# Patient Record
Sex: Male | Born: 1945 | Race: White | Hispanic: No
Health system: Southern US, Community
[De-identification: ages and names within clinical notes are randomized; demographics above are authoritative.]

## PROBLEM LIST (undated history)

## (undated) DIAGNOSIS — I639 Cerebral infarction, unspecified: Secondary | ICD-10-CM

## (undated) DIAGNOSIS — N400 Enlarged prostate without lower urinary tract symptoms: Secondary | ICD-10-CM

## (undated) DIAGNOSIS — G629 Polyneuropathy, unspecified: Secondary | ICD-10-CM

## (undated) DIAGNOSIS — I10 Essential (primary) hypertension: Secondary | ICD-10-CM

## (undated) DIAGNOSIS — F32A Depression, unspecified: Secondary | ICD-10-CM

## (undated) DIAGNOSIS — K219 Gastro-esophageal reflux disease without esophagitis: Secondary | ICD-10-CM

## (undated) DIAGNOSIS — E7800 Pure hypercholesterolemia, unspecified: Secondary | ICD-10-CM

## (undated) DIAGNOSIS — G5600 Carpal tunnel syndrome, unspecified upper limb: Secondary | ICD-10-CM

## (undated) DIAGNOSIS — Z853 Personal history of malignant neoplasm of breast: Secondary | ICD-10-CM

## (undated) HISTORY — DX: Gastro-esophageal reflux disease without esophagitis: K21.9

## (undated) HISTORY — DX: Benign prostatic hyperplasia without lower urinary tract symptoms: N40.0

## (undated) HISTORY — DX: Polyneuropathy, unspecified: G62.9

## (undated) HISTORY — DX: Depression, unspecified: F32.A

## (undated) HISTORY — DX: Carpal tunnel syndrome, unspecified upper limb: G56.00

## (undated) HISTORY — DX: Essential (primary) hypertension: I10

## (undated) HISTORY — PX: TONSILLECTOMY AND ADENOIDECTOMY: SUR1326

## (undated) HISTORY — PX: LITHOTRIPSY: SUR834

## (undated) HISTORY — DX: Personal history of malignant neoplasm of breast: Z85.3

---

## 2011-06-20 ENCOUNTER — Emergency Department (INDEPENDENT_AMBULATORY_CARE_PROVIDER_SITE_OTHER): Payer: Medicare Other

## 2011-06-20 ENCOUNTER — Encounter (HOSPITAL_BASED_OUTPATIENT_CLINIC_OR_DEPARTMENT_OTHER): Payer: Self-pay | Admitting: Emergency Medicine

## 2011-06-20 ENCOUNTER — Emergency Department (HOSPITAL_BASED_OUTPATIENT_CLINIC_OR_DEPARTMENT_OTHER)
Admission: EM | Admit: 2011-06-20 | Discharge: 2011-06-20 | Disposition: A | Discharge status: Home Health | Payer: Medicare Other | Attending: Emergency Medicine | Admitting: Emergency Medicine

## 2011-06-20 DIAGNOSIS — M62830 Muscle spasm of back: Secondary | ICD-10-CM

## 2011-06-20 DIAGNOSIS — W19XXXA Unspecified fall, initial encounter: Secondary | ICD-10-CM

## 2011-06-20 DIAGNOSIS — M25559 Pain in unspecified hip: Secondary | ICD-10-CM | POA: Insufficient documentation

## 2011-06-20 DIAGNOSIS — M545 Low back pain, unspecified: Secondary | ICD-10-CM | POA: Insufficient documentation

## 2011-06-20 DIAGNOSIS — M5380 Other specified dorsopathies, site unspecified: Secondary | ICD-10-CM | POA: Insufficient documentation

## 2011-06-20 DIAGNOSIS — S4980XA Other specified injuries of shoulder and upper arm, unspecified arm, initial encounter: Secondary | ICD-10-CM | POA: Insufficient documentation

## 2011-06-20 DIAGNOSIS — E119 Type 2 diabetes mellitus without complications: Secondary | ICD-10-CM | POA: Insufficient documentation

## 2011-06-20 DIAGNOSIS — Z79899 Other long term (current) drug therapy: Secondary | ICD-10-CM | POA: Insufficient documentation

## 2011-06-20 DIAGNOSIS — Z7982 Long term (current) use of aspirin: Secondary | ICD-10-CM | POA: Insufficient documentation

## 2011-06-20 DIAGNOSIS — W108XXA Fall (on) (from) other stairs and steps, initial encounter: Secondary | ICD-10-CM | POA: Insufficient documentation

## 2011-06-20 DIAGNOSIS — E7800 Pure hypercholesterolemia, unspecified: Secondary | ICD-10-CM | POA: Insufficient documentation

## 2011-06-20 DIAGNOSIS — S46909A Unspecified injury of unspecified muscle, fascia and tendon at shoulder and upper arm level, unspecified arm, initial encounter: Secondary | ICD-10-CM | POA: Insufficient documentation

## 2011-06-20 DIAGNOSIS — M25519 Pain in unspecified shoulder: Secondary | ICD-10-CM | POA: Insufficient documentation

## 2011-06-20 HISTORY — DX: Pure hypercholesterolemia, unspecified: E78.00

## 2011-06-20 MED ORDER — OXYCODONE-ACETAMINOPHEN 5-325 MG PO TABS: 1.0000 | ORAL_TABLET | Freq: Four times a day (QID) | ORAL | Status: DC | PRN

## 2011-06-20 MED ORDER — DIAZEPAM 5 MG PO TABS
5.0000 mg | ORAL_TABLET | Freq: Once | ORAL | Status: AC
Start: 2011-06-20 — End: 2011-06-20
  Administered 2011-06-20: 5 mg via ORAL
  Filled 2011-06-20: qty 1

## 2011-06-20 MED ORDER — DIAZEPAM 5 MG PO TABS: 5.0000 mg | ORAL_TABLET | Freq: Four times a day (QID) | ORAL | Status: AC | PRN

## 2011-06-20 MED ORDER — KETOROLAC TROMETHAMINE 10 MG PO TABS: 10.0000 mg | ORAL_TABLET | Freq: Four times a day (QID) | ORAL | Status: DC | PRN

## 2011-06-20 MED ORDER — HYDROMORPHONE HCL PF 1 MG/ML IJ SOLN
1.0000 mg | Freq: Once | INTRAMUSCULAR | Status: AC
  Administered 2011-06-20: 1 mg via INTRAMUSCULAR
  Filled 2011-06-20: qty 1

## 2011-06-20 MED ORDER — KETOROLAC TROMETHAMINE 60 MG/2ML IM SOLN
60.0000 mg | Freq: Once | INTRAMUSCULAR | Status: AC
  Administered 2011-06-20: 60 mg via INTRAMUSCULAR
  Filled 2011-06-20: qty 2

## 2011-06-20 MED ORDER — KETOROLAC TROMETHAMINE 10 MG PO TABS: 10.0000 mg | ORAL_TABLET | Freq: Four times a day (QID) | ORAL | Status: AC | PRN

## 2011-06-20 NOTE — ED Notes (Signed)
Pt fell on steps while walking down one step.  Pt slipped and fell onto bottom and left flank area.  Pain in both areas.  No head injury.  No LOC.  Denies dizziness prior to fall.

## 2011-06-20 NOTE — ED Notes (Signed)
Patients prescriptions were printed at the families request by MD and instructions given on medication administration

## 2011-06-20 NOTE — ED Provider Notes (Signed)
History  Scribed for Gwyneth Sprout, MD, the patient was seen in room MH02/MH02. This chart was scribed by Candelaria Stagers. The patient's care started at 5:32 PM    CSN: [id number]  Arrival date & time 06/20/11  1628   First MD Initiated Contact with Patient 06/20/11 1724      Chief Complaint  Patient presents with  . Fall  . Shoulder Injury  . Back Pain     The history is provided by the patient.   John [name] is a 66 y.o. male who presents to the Emergency Department complaining of back, hip, and shoulder pain after experiencing a mechanical fall about two hours ago.  Pt states that he fell down two stairs falling on his left hip, lower back, and shoulder.  He states that his right side feels normal.  The pain is worsened with movement.  He denies hitting his head and denies neck pain.  He has no history of back pain.  He is also experiencing muscle spasms of his left lower back.      Past Medical History  Diagnosis Date  . Diabetes mellitus   . Hypercholesteremia     History reviewed. No pertinent past surgical history.  History reviewed. No pertinent family history.  History  Substance Use Topics  . Smoking status: Never Smoker   . Smokeless tobacco: Not on file  . Alcohol Use: No      Review of Systems  Musculoskeletal: Positive for back pain.       Left hip, back, and shoulder pain  All other systems reviewed and are negative.    Allergies  Ramipril  Home Medications   Current Outpatient Rx  Name Route Sig Dispense Refill  . AMLODIPINE BESYLATE 5 MG PO TABS Oral Take 5 mg by mouth daily.    . ASPIRIN 325 MG PO TBEC Oral Take 325 mg by mouth daily.    . ATORVASTATIN CALCIUM 10 MG PO TABS Oral Take 10 mg by mouth at bedtime.    Marland Kitchen ESOMEPRAZOLE MAGNESIUM 40 MG PO CPDR Oral Take 40 mg by mouth daily.    . FENOFIBRATE 145 MG PO TABS Oral Take 145 mg by mouth daily.    . OMEGA-3 FATTY ACIDS 1000 MG PO CAPS Oral Take 1 g by mouth daily.    .  GLYBURIDE-METFORMIN 5-500 MG PO TABS Oral Take 3 tablets by mouth daily.    Marland Kitchen PIOGLITAZONE HCL 45 MG PO TABS Oral Take 45 mg by mouth daily.    Marland Kitchen SITAGLIPTIN PHOSPHATE 100 MG PO TABS Oral Take 100 mg by mouth daily.    Marland Kitchen VITAMIN C 500 MG PO TABS Oral Take 1,500 mg by mouth daily.      BP 150/44  Pulse 68  Temp(Src) 98.6 F (37 C) (Oral)  Resp 24  SpO2 95%  Physical Exam  Nursing note and vitals reviewed. Constitutional: He is oriented to person, place, and time. He appears well-developed and well-nourished.       Appears uncomfortable  HENT:  Head: Normocephalic and atraumatic.  Eyes: EOM are normal. Right eye exhibits no discharge. Left eye exhibits no discharge.  Neck: Normal range of motion.  Cardiovascular: Intact distal pulses.   Pulmonary/Chest: No respiratory distress.  Musculoskeletal:       No lumbar thoracic shoulder elbow knee or ankle tenderness, normal ROM.  Severe spasms with movement in the left paralumbar region.   Neurological: He is alert and oriented to person, place, and time.  Skin:  Skin is warm and dry.  Psychiatric: He has a normal mood and affect. His behavior is normal.    ED Course  Procedures  DIAGNOSTIC STUDIES: Oxygen Saturation is 95% on room air, normal by my interpretation.    COORDINATION OF CARE:  17:35 DG Lumbar Spine Complete ; ketorolac (TORADOL) injection 60 mg ; HYDROmorphone (DILAUDID) injection 1 mg ; diazepam (VALIUM) tablet 5 mg  No results found for this or any previous visit. Dg Lumbar Spine Complete  06/20/2011  *RADIOLOGY REPORT*  Clinical Data: History of fall complaining of lower back pain.  LUMBAR SPINE - COMPLETE 4+ VIEW  Comparison: No priors.  Findings: No acute displaced fractures or compression type fractures are noted.  There is loss of intervertebral disc height at many levels, most severe at L5-S1.  Multilevel facet arthropathy is also noted, most severe from L3-S1.  IMPRESSION: 1.  No acute radiographic abnormality  of the lumbar spine. 2.  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  Original Report Authenticated By: Florencia Reasons, M.D.      Labs Reviewed - No data to display No results found.   1. Back spasm       MDM  Patient with mechanical fall down the stairs today for severe spasm in his left back. There is no point tenderness in his L-spine and he denies any numbness or weakness in his legs. Neurovascularly intact and normal pulses distally. Patient denies head injury or LOC. Lumbar films are negative. After Toradol and Dilaudid patient's pain is much improved. 6:32 PM Patient feeling much better after medications. We'll discharge home  I personally performed the services described in this documentation, which was scribed in my presence.  The recorded information has been reviewed and considered.        Gwyneth Sprout, MD 06/21/11 [phone]

## 2011-06-20 NOTE — ED Notes (Signed)
Patient states he slipped off 2nd step and fell on shoulder and hit lower back, c/o back spasms, hurts to move or twist, states able to move shoulders

## 2012-01-19 ENCOUNTER — Other Ambulatory Visit: Payer: Self-pay | Admitting: Orthopedic Surgery

## 2012-01-19 DIAGNOSIS — M25512 Pain in left shoulder: Secondary | ICD-10-CM

## 2012-01-21 ENCOUNTER — Ambulatory Visit
Admission: RE | Admit: 2012-01-21 | Discharge: 2012-01-21 | Disposition: A | Discharge status: Home / Self Care | Payer: Medicare Other | Source: Ambulatory Visit | Attending: Orthopedic Surgery | Admitting: Orthopedic Surgery

## 2012-01-21 DIAGNOSIS — M25512 Pain in left shoulder: Secondary | ICD-10-CM

## 2012-01-22 ENCOUNTER — Other Ambulatory Visit: Payer: Medicare Other

## 2014-09-16 DIAGNOSIS — R002 Palpitations: Secondary | ICD-10-CM | POA: Insufficient documentation

## 2014-09-16 DIAGNOSIS — E139 Other specified diabetes mellitus without complications: Secondary | ICD-10-CM | POA: Insufficient documentation

## 2015-07-02 DIAGNOSIS — E119 Type 2 diabetes mellitus without complications: Secondary | ICD-10-CM | POA: Insufficient documentation

## 2015-10-15 ENCOUNTER — Other Ambulatory Visit: Payer: Self-pay | Admitting: Orthopedic Surgery

## 2015-10-15 DIAGNOSIS — M542 Cervicalgia: Secondary | ICD-10-CM

## 2015-10-23 ENCOUNTER — Ambulatory Visit
Admission: RE | Admit: 2015-10-23 | Discharge: 2015-10-23 | Disposition: A | Discharge status: Home / Self Care | Payer: Medicare Other | Source: Ambulatory Visit | Attending: Orthopedic Surgery | Admitting: Orthopedic Surgery

## 2015-10-23 DIAGNOSIS — M542 Cervicalgia: Secondary | ICD-10-CM

## 2016-03-05 DIAGNOSIS — Z79810 Long term (current) use of selective estrogen receptor modulators (SERMs): Secondary | ICD-10-CM | POA: Insufficient documentation

## 2016-09-17 DIAGNOSIS — E6609 Other obesity due to excess calories: Secondary | ICD-10-CM | POA: Insufficient documentation

## 2016-09-17 DIAGNOSIS — E66811 Obesity, class 1: Secondary | ICD-10-CM | POA: Insufficient documentation

## 2016-11-09 DIAGNOSIS — H5203 Hypermetropia, bilateral: Secondary | ICD-10-CM | POA: Insufficient documentation

## 2017-01-07 DIAGNOSIS — H43812 Vitreous degeneration, left eye: Secondary | ICD-10-CM | POA: Insufficient documentation

## 2017-01-07 DIAGNOSIS — H35373 Puckering of macula, bilateral: Secondary | ICD-10-CM | POA: Insufficient documentation

## 2017-01-17 DIAGNOSIS — Z961 Presence of intraocular lens: Secondary | ICD-10-CM | POA: Insufficient documentation

## 2017-01-18 HISTORY — PX: CATARACT EXTRACTION, BILATERAL: SHX1313

## 2017-11-30 DIAGNOSIS — H17813 Minor opacity of cornea, bilateral: Secondary | ICD-10-CM | POA: Insufficient documentation

## 2017-12-09 DIAGNOSIS — R590 Localized enlarged lymph nodes: Secondary | ICD-10-CM | POA: Insufficient documentation

## 2017-12-13 DIAGNOSIS — C50911 Malignant neoplasm of unspecified site of right female breast: Secondary | ICD-10-CM | POA: Insufficient documentation

## 2018-01-18 DIAGNOSIS — E860 Dehydration: Secondary | ICD-10-CM | POA: Insufficient documentation

## 2018-02-22 DIAGNOSIS — K580 Irritable bowel syndrome with diarrhea: Secondary | ICD-10-CM | POA: Insufficient documentation

## 2018-05-02 HISTORY — PX: MASTECTOMY: SHX3

## 2018-05-17 DIAGNOSIS — G608 Other hereditary and idiopathic neuropathies: Secondary | ICD-10-CM | POA: Insufficient documentation

## 2019-01-03 DIAGNOSIS — Z978 Presence of other specified devices: Secondary | ICD-10-CM | POA: Insufficient documentation

## 2019-01-03 DIAGNOSIS — Z95828 Presence of other vascular implants and grafts: Secondary | ICD-10-CM | POA: Insufficient documentation

## 2019-05-21 ENCOUNTER — Ambulatory Visit: Payer: Medicare Other

## 2019-06-08 ENCOUNTER — Ambulatory Visit: Payer: Medicare Other

## 2019-08-31 ENCOUNTER — Ambulatory Visit: Payer: Medicare Other | Admitting: Family Medicine

## 2019-09-29 DIAGNOSIS — M792 Neuralgia and neuritis, unspecified: Secondary | ICD-10-CM | POA: Insufficient documentation

## 2019-09-29 DIAGNOSIS — E1142 Type 2 diabetes mellitus with diabetic polyneuropathy: Secondary | ICD-10-CM | POA: Insufficient documentation

## 2019-10-17 ENCOUNTER — Encounter: Payer: Self-pay | Admitting: Family Medicine

## 2019-10-17 ENCOUNTER — Ambulatory Visit: Payer: Medicare Other | Admitting: Family Medicine

## 2019-10-17 ENCOUNTER — Other Ambulatory Visit: Payer: Self-pay

## 2019-10-17 VITALS — BP 128/62 | HR 79 | Temp 98.8°F | Ht 72.0 in | Wt 242.0 lb

## 2019-10-17 DIAGNOSIS — G63 Polyneuropathy in diseases classified elsewhere: Secondary | ICD-10-CM

## 2019-10-17 DIAGNOSIS — I10 Essential (primary) hypertension: Secondary | ICD-10-CM | POA: Insufficient documentation

## 2019-10-17 DIAGNOSIS — C801 Malignant (primary) neoplasm, unspecified: Secondary | ICD-10-CM

## 2019-10-17 DIAGNOSIS — G629 Polyneuropathy, unspecified: Secondary | ICD-10-CM | POA: Insufficient documentation

## 2019-10-17 DIAGNOSIS — K219 Gastro-esophageal reflux disease without esophagitis: Secondary | ICD-10-CM | POA: Insufficient documentation

## 2019-10-17 DIAGNOSIS — N400 Enlarged prostate without lower urinary tract symptoms: Secondary | ICD-10-CM | POA: Insufficient documentation

## 2019-10-17 DIAGNOSIS — F325 Major depressive disorder, single episode, in full remission: Secondary | ICD-10-CM | POA: Diagnosis not present

## 2019-10-17 DIAGNOSIS — Z853 Personal history of malignant neoplasm of breast: Secondary | ICD-10-CM | POA: Insufficient documentation

## 2019-10-17 DIAGNOSIS — G5600 Carpal tunnel syndrome, unspecified upper limb: Secondary | ICD-10-CM | POA: Insufficient documentation

## 2019-10-17 NOTE — Progress Notes (Signed)
Chief Complaint  Patient presents with  . New Patient (Initial Visit)       New Patient Visit SUBJECTIVE: HPI: John [name] is an 74 y.o.male who is being seen for establishing care.  The patient was previously seen at [facility], PCP retired.  Patient has a history of diabetes that is uncontrolled.  He follows with endocrinology.  Patient has a history of breast cancer on the right.  He is status post removal/resection.  He follows with the oncology team.  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  He is currently taking Prozac 20 mg daily.  He is compliant and reports no adverse effects.  He is interested in coming off of this.  His sister-in-law is in hospice and he would like to wait until after she passes before weaning off.  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  It affects his upper and lower extremities equally.  He will sometimes have balance issues due to lack of proprioception.  He has been taking gabapentin for this and believes it is affecting his balance without helping his neuropathy issue.  He would like to come off of it.  Past Medical History:  Diagnosis Date  . Diabetes mellitus   . Hypercholesteremia    History reviewed. No pertinent surgical history. History reviewed. No pertinent family history. Allergies  Allergen Reactions  . Ramipril Anaphylaxis    Current Outpatient Medications:  .  amLODipine (NORVASC) 5 MG tablet, Take 5 mg by mouth daily., Disp: , Rfl:  .  aspirin 325 MG EC tablet, Take 325 mg by mouth daily., Disp: , Rfl:  .  Dulaglutide (TRULICITY) 1.5 DD/2.2GU SOPN, Inject 1.5 mg into the skin., Disp: , Rfl:  .  FLUoxetine (PROZAC) 20 MG capsule, Take 20 mg by mouth daily., Disp: , Rfl:  .  gabapentin (NEURONTIN) 300 MG capsule, Take 300 mg by mouth 6 (six) times daily., Disp: , Rfl:  .  insulin glargine (LANTUS) 100 UNIT/ML injection, Inject 80 Units into the skin daily., Disp: ,  Rfl:  .  metFORMIN (GLUCOPHAGE-XR) 500 MG 24 hr tablet, Take 500 mg by mouth in the morning, at noon, and at bedtime., Disp: , Rfl:  .  Multiple Vitamin (MULTIVITAMIN) tablet, Take 1 tablet by mouth daily., Disp: , Rfl:  .  omeprazole (PRILOSEC) 40 MG capsule, Take 40 mg by mouth daily., Disp: , Rfl:  .  tamoxifen (NOLVADEX) 20 MG tablet, Take 20 mg by mouth daily., Disp: , Rfl:  .  vitamin C (ASCORBIC ACID) 500 MG tablet, Take 1,500 mg by mouth daily., Disp: , Rfl:   OBJECTIVE: BP 128/62 (BP Location: Left Arm, Patient Position: Sitting, Cuff Size: Large)   Pulse 79   Temp 98.8 F (37.1 C) (Oral)   Ht 6' (1.829 m)   Wt 242 lb (109.8 kg)   SpO2 95%   BMI 32.82 kg/m  General:  well developed, well nourished, in no apparent distress Skin:  no significant moles, warts, or growths Lungs:  clear to auscultation, breath sounds equal bilaterally, no respiratory distress Cardio:  regular rate and rhythm, 2+ pitting LE edema tapering at proximal third of tibia bilaterally, no bruits Neuro: No cerebellar signs Psych: well oriented with normal range of affect and appropriate judgment/insight  ASSESSMENT/PLAN: Neuropathy associated with cancer ([place])  Essential hypertension  Depression, major, single episode, complete remission ([place])  Ok to drop a capsule of gabapentin from daily regimen every 3-5 days until  s/s's worsen or he is no longer taking.  Cont meds for htn. For depression, take 10 mg/d for 2 weeks then stop when ready.  Patient should return in 4 mo. The patient voiced understanding and agreement to the plan.   Welcome, DO 10/17/19  3:12 PM

## 2019-10-17 NOTE — Patient Instructions (Addendum)
When you are ready, take 10 mg daily of the Prozac daily for 2 weeks and then stop.   Also when you are ready, take away 1 capsule every 3-5 days until you are no longer taking any more medication.  We can always add things back.    Let us know if you need anything.

## 2019-10-24 ENCOUNTER — Emergency Department (HOSPITAL_BASED_OUTPATIENT_CLINIC_OR_DEPARTMENT_OTHER): Payer: Medicare Other

## 2019-10-24 ENCOUNTER — Emergency Department (HOSPITAL_BASED_OUTPATIENT_CLINIC_OR_DEPARTMENT_OTHER)
Admission: EM | Admit: 2019-10-24 | Discharge: 2019-10-24 | Disposition: A | Discharge status: Home / Self Care | Payer: Medicare Other | Source: Home / Self Care | Attending: Emergency Medicine | Admitting: Emergency Medicine

## 2019-10-24 ENCOUNTER — Telehealth: Payer: Self-pay | Admitting: Adult Health

## 2019-10-24 ENCOUNTER — Encounter (HOSPITAL_BASED_OUTPATIENT_CLINIC_OR_DEPARTMENT_OTHER): Payer: Self-pay | Admitting: Emergency Medicine

## 2019-10-24 ENCOUNTER — Other Ambulatory Visit: Payer: Self-pay

## 2019-10-24 DIAGNOSIS — R1110 Vomiting, unspecified: Secondary | ICD-10-CM | POA: Insufficient documentation

## 2019-10-24 DIAGNOSIS — U071 COVID-19: Secondary | ICD-10-CM | POA: Insufficient documentation

## 2019-10-24 DIAGNOSIS — R509 Fever, unspecified: Secondary | ICD-10-CM

## 2019-10-24 DIAGNOSIS — R531 Weakness: Secondary | ICD-10-CM | POA: Diagnosis not present

## 2019-10-24 DIAGNOSIS — R059 Cough, unspecified: Secondary | ICD-10-CM

## 2019-10-24 LAB — COMPREHENSIVE METABOLIC PANEL
ALT: 41 U/L (ref 0–44)
AST: 48 U/L — ABNORMAL HIGH (ref 15–41)
Albumin: 3.6 g/dL (ref 3.5–5.0)
Alkaline Phosphatase: 37 U/L — ABNORMAL LOW (ref 38–126)
Anion gap: 13 (ref 5–15)
BUN: 14 mg/dL (ref 8–23)
CO2: 24 mmol/L (ref 22–32)
Calcium: 8.7 mg/dL — ABNORMAL LOW (ref 8.9–10.3)
Chloride: 96 mmol/L — ABNORMAL LOW (ref 98–111)
Creatinine, Ser: 1.04 mg/dL (ref 0.61–1.24)
GFR calc Af Amer: >60 mL/min (ref >60)
GFR calc non Af Amer: >60 mL/min (ref >60)
Glucose, Bld: 285 mg/dL — ABNORMAL HIGH (ref 70–99)
Potassium: 3.8 mmol/L (ref 3.5–5.1)
Sodium: 133 mmol/L — ABNORMAL LOW (ref 135–145)
Total Bilirubin: 1 mg/dL (ref 0.3–1.2)
Total Protein: 6.4 g/dL — ABNORMAL LOW (ref 6.5–8.1)

## 2019-10-24 LAB — URINALYSIS, ROUTINE W REFLEX MICROSCOPIC
Bilirubin Urine: NEGATIVE
Glucose, UA: >=500 mg/dL — AB
Hgb urine dipstick: NEGATIVE
Ketones, ur: 15 mg/dL — AB
Leukocytes,Ua: NEGATIVE
Nitrite: NEGATIVE
Protein, ur: NEGATIVE mg/dL
Specific Gravity, Urine: 1.025 (ref 1.005–1.030)
pH: 6 (ref 5.0–8.0)

## 2019-10-24 LAB — URINALYSIS, MICROSCOPIC (REFLEX): Squamous Epithelial / HPF: NONE SEEN (ref 0–5)

## 2019-10-24 LAB — CBC
HCT: 40.5 % (ref 39.0–52.0)
Hemoglobin: 14 g/dL (ref 13.0–17.0)
MCH: 31.3 pg (ref 26.0–34.0)
MCHC: 34.6 g/dL (ref 30.0–36.0)
MCV: 90.6 fL (ref 80.0–100.0)
Platelets: 181 10*3/uL (ref 150–400)
RBC: 4.47 MIL/uL (ref 4.22–5.81)
RDW: 14 % (ref 11.5–15.5)
WBC: 6.4 10*3/uL (ref 4.0–10.5)
nRBC: 0 % (ref 0.0–0.2)

## 2019-10-24 LAB — LIPASE, BLOOD: Lipase: 22 U/L (ref 11–51)

## 2019-10-24 LAB — SARS CORONAVIRUS 2 BY RT PCR (HOSPITAL ORDER, PERFORMED IN ~~LOC~~ HOSPITAL LAB): SARS Coronavirus 2: POSITIVE — AB

## 2019-10-24 LAB — LACTIC ACID, PLASMA
Lactic Acid, Venous: 3 mmol/L (ref 0.5–1.9)
Lactic Acid, Venous: 3.2 mmol/L (ref 0.5–1.9)

## 2019-10-24 MED ORDER — TETANUS-DIPHTH-ACELL PERTUSSIS 5-2.5-18.5 LF-MCG/0.5 IM SUSP: 0.5000 mL | Freq: Once | INTRAMUSCULAR | Status: DC

## 2019-10-24 MED ORDER — SODIUM CHLORIDE 0.9% FLUSH
3.0000 mL | Freq: Once | INTRAVENOUS | Status: DC
  Filled 2019-10-24: qty 3

## 2019-10-24 MED ORDER — SODIUM CHLORIDE 0.9 % IV BOLUS
1000.0000 mL | Freq: Once | INTRAVENOUS | Status: AC
  Administered 2019-10-24: 1000 mL via INTRAVENOUS

## 2019-10-24 MED ORDER — AMOXICILLIN-POT CLAVULANATE 875-125 MG PO TABS: 1.0000 | ORAL_TABLET | Freq: Once | ORAL | Status: DC

## 2019-10-24 MED ORDER — ONDANSETRON HCL 4 MG PO TABS
4.0000 mg | ORAL_TABLET | Freq: Three times a day (TID) | ORAL | 0 refills | Status: DC | PRN
Start: 2019-10-24 — End: 2019-11-23

## 2019-10-24 MED ORDER — ONDANSETRON HCL 4 MG/2ML IJ SOLN
4.0000 mg | Freq: Once | INTRAMUSCULAR | Status: AC
  Administered 2019-10-24: 4 mg via INTRAVENOUS
  Filled 2019-10-24: qty 2

## 2019-10-24 MED ORDER — RABIES IMMUNE GLOBULIN 150 UNIT/ML IM INJ: 20.0000 [IU]/kg | INJECTION | Freq: Once | INTRAMUSCULAR | Status: DC

## 2019-10-24 MED ORDER — RABIES VACCINE, PCEC IM SUSR: 1.0000 mL | Freq: Once | INTRAMUSCULAR | Status: DC

## 2019-10-24 NOTE — ED Triage Notes (Signed)
Fever , n/v x 3 days  Pt is diabetic last sugar  258 per ems

## 2019-10-24 NOTE — ED Provider Notes (Signed)
[facility] [facility] EMERGENCY DEPARTMENT Provider Note   CSN: [id number] Arrival date & time: 10/24/19  4401     History Chief Complaint  Patient presents with  . Fever  . Vomiting    John [name] is a 74 y.o. male.  The history is provided by the patient and medical records. No language interpreter was used.  Fever Max temp prior to arrival:  101 Temp source:  Oral Severity:  Moderate Onset quality:  Gradual Duration:  2 days Timing:  Constant Progression:  Waxing and waning Chronicity:  New Relieved by:  Nothing Worsened by:  Nothing Ineffective treatments:  None tried Associated symptoms: chills, congestion, cough, myalgias, nausea and vomiting   Associated symptoms: no chest pain, no confusion, no diarrhea, no dysuria, no headaches, no rash, no rhinorrhea and no somnolence   Risk factors: hx of cancer and sick contacts        Past Medical History:  Diagnosis Date  . BPH (benign prostatic hyperplasia)   . CTS (carpal tunnel syndrome)   . Depression   . Diabetes mellitus   . Essential hypertension   . GERD (gastroesophageal reflux disease)   . History of breast cancer   . Hypercholesteremia   . Neuropathy     Patient Active Problem List   Diagnosis Date Noted  . Neuropathy associated with cancer ([place]) 10/17/2019  . Essential hypertension 10/17/2019  . Depression, major, single episode, complete remission ([place]) 10/17/2019  . History of breast cancer   . GERD (gastroesophageal reflux disease)   . BPH (benign prostatic hyperplasia)   . CTS (carpal tunnel syndrome)     Past Surgical History:  Procedure Laterality Date  . CATARACT EXTRACTION, BILATERAL  01/18/2017  . LITHOTRIPSY    . MASTECTOMY Right 05/02/2018  . TONSILLECTOMY AND ADENOIDECTOMY         Family History  Problem Relation Age of Onset  . Lymphoma Mother   . Diabetes Father   . Stroke Father   . Ovarian cancer Sister   . Cancer Paternal Grandmother     Social History   Tobacco  Use  . Smoking status: Never Smoker  . Smokeless tobacco: Never Used  Substance Use Topics  . Alcohol use: No  . Drug use: No    Home Medications Prior to Admission medications   Medication Sig Start Date End Date Taking? Authorizing Provider  amLODipine (NORVASC) 5 MG tablet Take 5 mg by mouth daily.   Yes [provider]  aspirin 325 MG EC tablet Take 325 mg by mouth daily.   Yes [provider]  Dulaglutide (TRULICITY) 1.5 UU/7.2ZD SOPN Inject 1.5 mg into the skin.   Yes [provider]  FLUoxetine (PROZAC) 20 MG capsule Take 20 mg by mouth daily.   Yes [provider]  gabapentin (NEURONTIN) 300 MG capsule Take 300 mg by mouth 6 (six) times daily.   Yes [provider]  insulin glargine (LANTUS) 100 UNIT/ML injection Inject 80 Units into the skin daily.   Yes [provider]  Multiple Vitamin (MULTIVITAMIN) tablet Take 1 tablet by mouth daily.   Yes [provider]  omeprazole (PRILOSEC) 40 MG capsule Take 40 mg by mouth daily.   Yes [provider]  tamoxifen (NOLVADEX) 20 MG tablet Take 20 mg by mouth daily.   Yes [provider]  vitamin C (ASCORBIC ACID) 500 MG tablet Take 1,500 mg by mouth daily.   Yes [provider]  metFORMIN (GLUCOPHAGE-XR) 500 MG 24 hr  tablet Take 500 mg by mouth in the morning, at noon, and at bedtime.    [provider]    Allergies    Ramipril  Review of Systems   Review of Systems  Constitutional: Positive for chills, fatigue and fever. Negative for appetite change and diaphoresis.  HENT: Positive for congestion. Negative for rhinorrhea.   Eyes: Negative for visual disturbance.  Respiratory: Positive for cough. Negative for chest tightness, shortness of breath, wheezing and stridor.   Cardiovascular: Negative for chest pain, palpitations and leg swelling.  Gastrointestinal: Positive for nausea and vomiting. Negative for abdominal pain, constipation  and diarrhea.  Genitourinary: Negative for dysuria, flank pain and frequency.  Musculoskeletal: Positive for myalgias. Negative for back pain, neck pain and neck stiffness.  Skin: Negative for rash and wound.  Neurological: Negative for dizziness, facial asymmetry, light-headedness, numbness and headaches.  Psychiatric/Behavioral: Negative for agitation and confusion.  All other systems reviewed and are negative.   Physical Exam Updated Vital Signs Ht 6' (1.829 m)   Wt 108.9 kg   BMI 32.55 kg/m   Physical Exam Vitals and nursing note reviewed.  Constitutional:      General: He is not in acute distress.    Appearance: He is well-developed. He is not ill-appearing, toxic-appearing or diaphoretic.  HENT:     Head: Normocephalic and atraumatic.     Nose: Congestion present. No rhinorrhea.     Mouth/Throat:     Mouth: Mucous membranes are dry.     Pharynx: No oropharyngeal exudate or posterior oropharyngeal erythema.  Eyes:     Extraocular Movements: Extraocular movements intact.     Conjunctiva/sclera: Conjunctivae normal.     Pupils: Pupils are equal, round, and reactive to light.  Cardiovascular:     Rate and Rhythm: Normal rate and regular rhythm.     Pulses: Normal pulses.     Heart sounds: No murmur heard.   Pulmonary:     Effort: Pulmonary effort is normal. No respiratory distress.     Breath sounds: Normal breath sounds. No wheezing, rhonchi or rales.  Chest:     Chest wall: No tenderness.  Abdominal:     General: Abdomen is flat. There is no distension.     Palpations: Abdomen is soft.     Tenderness: There is no abdominal tenderness. There is no right CVA tenderness, left CVA tenderness, guarding or rebound.  Musculoskeletal:        General: No tenderness.     Cervical back: Neck supple. No tenderness.  Skin:    General: Skin is warm and dry.     Capillary Refill: Capillary refill takes less than 2 seconds.     Findings: No erythema.  Neurological:      General: No focal deficit present.     Mental Status: He is alert.     Sensory: No sensory deficit.     Motor: No weakness.  Psychiatric:        Mood and Affect: Mood normal.     ED Results / Procedures / Treatments   Labs (all labs ordered are listed, but only abnormal results are displayed) Labs Reviewed  SARS CORONAVIRUS 2 BY RT PCR (HOSPITAL ORDER, [facility]) - Abnormal; Notable for the following components:      Result Value   SARS Coronavirus 2 POSITIVE (*)    All other components within normal limits  COMPREHENSIVE METABOLIC PANEL - Abnormal; Notable for the following components:   Sodium 133 (*)  Chloride 96 (*)    Glucose, Bld 285 (*)    Calcium 8.7 (*)    Total Protein 6.4 (*)    AST 48 (*)    Alkaline Phosphatase 37 (*)    All other components within normal limits  URINALYSIS, ROUTINE W REFLEX MICROSCOPIC - Abnormal; Notable for the following components:   Glucose, UA >=500 (*)    Ketones, ur 15 (*)    All other components within normal limits  LACTIC ACID, PLASMA - Abnormal; Notable for the following components:   Lactic Acid, Venous 3.0 (*)    All other components within normal limits  LACTIC ACID, PLASMA - Abnormal; Notable for the following components:   Lactic Acid, Venous 3.2 (*)    All other components within normal limits  URINALYSIS, MICROSCOPIC (REFLEX) - Abnormal; Notable for the following components:   Bacteria, UA RARE (*)    All other components within normal limits  URINE CULTURE  LIPASE, BLOOD  CBC    EKG EKG Interpretation  Date/Time:  Wednesday October 24 2019 09:08:41 EDT Ventricular Rate:  84 PR Interval:    QRS Duration: 151 QT Interval:  404 QTC Calculation: 478 R Axis:   28 Text Interpretation: Sinus rhythm Probable left atrial enlargement Right bundle branch block No prior ECG for comparison. No STEMI Confirmed by Antony Blackbird [id number]) on 10/24/2019 9:36:30 AM   Radiology DG Chest Portable 1  View  Result Date: 10/24/2019 CLINICAL DATA:  Fever EXAM: PORTABLE CHEST 1 VIEW COMPARISON:  03/25/2018 FINDINGS: The heart size and mediastinal contours are within normal limits. Both lungs are clear. The visualized skeletal structures are unremarkable. IMPRESSION: No acute abnormality of the lungs in AP portable projection. Electronically Signed   By: Eddie Candle M.D.   On: 10/24/2019 09:45   US Abdomen Limited RUQ  Result Date: 10/24/2019 CLINICAL DATA:  74 year old male with history of recurrent emesis. COVID positive patient. EXAM: ULTRASOUND ABDOMEN LIMITED RIGHT UPPER QUADRANT COMPARISON:  No priors. FINDINGS: Gallbladder: No gallstones or wall thickening visualized. No sonographic Murphy sign noted by sonographer. Common bile duct: Diameter: 3.2 mm Liver: No focal lesion identified. Diffusely increased parenchymal echogenicity. Portal vein is patent on color Doppler imaging with normal direction of blood flow towards the liver. Other: None. IMPRESSION: 1. No acute findings. Specifically, no cholelithiasis or evidence to suggest an acute cholecystitis. 2. Hepatic steatosis. Electronically Signed   By: Vinnie Langton M.D.   On: 10/24/2019 12:53    Procedures Procedures (including critical care time)  Medications Ordered in ED Medications  ondansetron (ZOFRAN) injection 4 mg (4 mg Intravenous Given 10/24/19 1001)  sodium chloride 0.9 % bolus 1,000 mL (0 mLs Intravenous Stopped 10/24/19 1222)    ED Course  I have reviewed the triage vital signs and the nursing notes.  Pertinent labs & imaging results that were available during my care of the patient were reviewed by me and considered in my medical decision making (see chart for details).    MDM Rules/Calculators/A&P                          John [name] is a 74 y.o. male with a past medical history significant for diabetes, hypertension, GERD, BPH, prior breast cancer, hypercholesterolemia, and depression who presents with 3 days of  fevers, chills, nausea, vomiting, dry heaving, slightly productive cough, decreased urination, feeling dehydrated, and malaise.  He reports that his symptoms been ongoing for the last few days.  He  does report that a week ago he had a possible Covid exposure while visiting a dying relative in the hospital.  He reports that he has not had shortness of breath or chest pain but has had some cough.  When I first evaluate the patient, his oxygen saturation was at 89% but during conversation he went back into the 90s on room air.  He reports no abdominal pain but does report the nausea vomiting and dry heaving has been causing him to feel dehydrated.  He reports his urine has decreased but is not having dysuria with it.  He denies any tick exposures or rashes to suggest recommend spotted fever.  He thinks he may just have a viral infection causing the fever but family requested he be evaluated.  On exam, lungs are clear and chest is nontender.  Abdomen was nontender.  Bowel sounds were appreciated.  Legs were nontender nonedematous with no significant rash seen.  No evidence of congestion or rhinorrhea.  Exam otherwise unremarkable.  Patient resting comfortably with transient hypoxia's on arrival.  We will get a work-up including a Covid swab.  We will get chest x-ray and urinalysis to look for pneumonia or UTI.  Will get a rapid ultrasound given the nausea vomiting fever and he still has a gallbladder.  Without significant tenderness, will hold on CT imaging.  Anticipate reassessment for work-up.  Will give some nausea medicine and fluids initially for symptomatic management.  Anticipate disposition determination after work-up.    2:07 PM Although patient had some oxygen saturations dipping into the 80s at times, he has now been monitored for several hours without any hypoxia.  His oxygen saturations remained in the 90s even with an ambulation test.  His lactic acid was around 3 initially and rose to 3.2 but  he is feeling much better after some fluids.  He no longer has nausea and vomiting after the nausea medicine.  His urinalysis shows some ketones likely related to dehydration but no evidence of UTI.  His right upper quadrant ultrasound not show evidence of acute cholecystitis.  We went over all his labs aspect he is dehydrated to the nausea and vomiting but his Covid test was positive.  I suspect his COVID-19 infection is contributing to his malaise, fever, nausea, vomiting, and cough.  His chest x-ray did not show pneumonia.  He will be p.o. challenge and if he is able to maintain hydration, will send him home with prescription for nausea medication and instructions to follow with PCP and isolate for home management of COVID-19.  We will leave his name, MRN, and contact information for the Covid management outpatient team for possible infusion therapy.  Anticipate discharge after p.o. challenge.  2:31 PM Patient did well on p.o. challenge.  He would like to go home.  I called the [facility] and gave them his contact information to get in touch with him to discuss outpatient monoclonal antibody treatment.  They will call him and see if he qualifies for management.  Patient we discharged home with return for nausea medicine as he is maintaining his oral hydration now.    Final Clinical Impression(s) / ED Diagnoses Final diagnoses:  Emesis  COVID-19  Fever, unspecified fever cause  Cough    Rx / DC Orders ED Discharge Orders         Ordered    ondansetron (ZOFRAN) 4 MG tablet  Every 8 hours PRN     Discontinue  Reprint  10/24/19 1433          Clinical Impression: 1. COVID-19   2. Emesis   3. Fever, unspecified fever cause   4. Cough     Disposition: Discharge  Condition: Good  I have discussed the results, Dx and Tx plan with the pt(& family if present). He/she/they expressed understanding and agree(s) with the plan. Discharge instructions  discussed at great length. Strict return precautions discussed and pt &/or family have verbalized understanding of the instructions. No further questions at time of discharge.    New Prescriptions   ONDANSETRON (ZOFRAN) 4 MG TABLET    Take 1 tablet (4 mg total) by mouth every 8 (eight) hours as needed for nausea or vomiting.    Follow Up: Shelda Pal, DO [address] [place] [address] [phone]     [facility] [facility] EMERGENCY DEPARTMENT [address] [id number] HT [id number] [place] [phone]        Khyri Hinzman, Gwenyth Allegra, MD 10/24/19 1439

## 2019-10-24 NOTE — Discharge Instructions (Signed)
Your work-up did reveal that you have the COVID-19 virus.  This is likely causing your fever, cough, nausea, vomiting, and malaise feeling.  Please self isolate and try to maintain hydration with the nausea medicine.  We discussed the possibility of admission however your oxygen saturations remained above 90% during ambulation and you would like to try going home.  Please rest and stay hydrated.  Please follow-up with your primary doctor.  I gave your contact information to the outpatient monoclonal infusion therapy team and they will contact you to discuss if you are a candidate for this therapy.  Otherwise, please be careful and if any symptoms change or worsen or you have worsened shortness of breath, please return immediately to the nearest emergency department.         Person Under Monitoring Name: John [name]  Location: [place] [address]   Infection Prevention Recommendations for Individuals Confirmed to have, or Being Evaluated for, 2019 Novel Coronavirus (COVID-19) Infection Who Receive Care at Home  Individuals who are confirmed to have, or are being evaluated for, COVID-19 should follow the prevention steps below until a healthcare provider or local or state health department says they can return to normal activities.  Stay home except to get medical care You should restrict activities outside your home, except for getting medical care. Do not go to work, school, or public areas, and do not use public transportation or taxis.  Call ahead before visiting your doctor Before your medical appointment, call the healthcare provider and tell them that you have, or are being evaluated for, COVID-19 infection. This will help the healthcare provider's office take steps to keep other people from getting infected. Ask your healthcare provider to call the local or state health department.  Monitor your symptoms Seek prompt medical attention if your illness is  worsening (e.g., difficulty breathing). Before going to your medical appointment, call the healthcare provider and tell them that you have, or are being evaluated for, COVID-19 infection. Ask your healthcare provider to call the local or state health department.  Wear a facemask You should wear a facemask that covers your nose and mouth when you are in the same room with other people and when you visit a healthcare provider. People who live with or visit you should also wear a facemask while they are in the same room with you.  Separate yourself from other people in your home As much as possible, you should stay in a different room from other people in your home. Also, you should use a separate bathroom, if available.  Avoid sharing household items You should not share dishes, drinking glasses, cups, eating utensils, towels, bedding, or other items with other people in your home. After using these items, you should wash them thoroughly with soap and water.  Cover your coughs and sneezes Cover your mouth and nose with a tissue when you cough or sneeze, or you can cough or sneeze into your sleeve. Throw used tissues in a lined trash can, and immediately wash your hands with soap and water for at least 20 seconds or use an alcohol-based hand rub.  Wash your [facility] your hands often and thoroughly with soap and water for at least 20 seconds. You can use an alcohol-based hand sanitizer if soap and water are not available and if your hands are not visibly dirty. Avoid touching your eyes, nose, and mouth with unwashed hands.      Prevention Steps for Caregivers  and Household Members of Individuals Confirmed to have, or Being Evaluated for, COVID-19 Infection Being Cared for in the Home  If you live with, or provide care at home for, a person confirmed to have, or being evaluated for, COVID-19 infection please follow these guidelines to prevent infection:  Follow healthcare provider's  instructions Make sure that you understand and can help the patient follow any healthcare provider instructions for all care.  Provide for the patient's basic needs You should help the patient with basic needs in the home and provide support for getting groceries, prescriptions, and other personal needs.  Monitor the patient's symptoms If they are getting sicker, call his or her medical provider and tell them that the patient has, or is being evaluated for, COVID-19 infection. This will help the healthcare provider's office take steps to keep other people from getting infected. Ask the healthcare provider to call the local or state health department.  Limit the number of people who have contact with the patient If possible, have only one caregiver for the patient. Other household members should stay in another home or place of residence. If this is not possible, they should stay in another room, or be separated from the patient as much as possible. Use a separate bathroom, if available. Restrict visitors who do not have an essential need to be in the home.  Keep older adults, very young children, and other sick people away from the patient Keep older adults, very young children, and those who have compromised immune systems or chronic health conditions away from the patient. This includes people with chronic heart, lung, or kidney conditions, diabetes, and cancer.  Ensure good ventilation Make sure that shared spaces in the home have good air flow, such as from an air conditioner or an opened window, weather permitting.  Wash your hands often Wash your hands often and thoroughly with soap and water for at least 20 seconds. You can use an alcohol based hand sanitizer if soap and water are not available and if your hands are not visibly dirty. Avoid touching your eyes, nose, and mouth with unwashed hands. Use disposable paper towels to dry your hands. If not available, use dedicated cloth  towels and replace them when they become wet.  Wear a facemask and gloves Wear a disposable facemask at all times in the room and gloves when you touch or have contact with the patient's blood, body fluids, and/or secretions or excretions, such as sweat, saliva, sputum, nasal mucus, vomit, urine, or feces.  Ensure the mask fits over your nose and mouth tightly, and do not touch it during use. Throw out disposable facemasks and gloves after using them. Do not reuse. Wash your hands immediately after removing your facemask and gloves. If your personal clothing becomes contaminated, carefully remove clothing and launder. Wash your hands after handling contaminated clothing. Place all used disposable facemasks, gloves, and other waste in a lined container before disposing them with other household waste. Remove gloves and wash your hands immediately after handling these items.  Do not share dishes, glasses, or other household items with the patient Avoid sharing household items. You should not share dishes, drinking glasses, cups, eating utensils, towels, bedding, or other items with a patient who is confirmed to have, or being evaluated for, COVID-19 infection. After the person uses these items, you should wash them thoroughly with soap and water.  Wash laundry thoroughly Immediately remove and wash clothes or bedding that have blood, body fluids, and/or secretions  or excretions, such as sweat, saliva, sputum, nasal mucus, vomit, urine, or feces, on them. Wear gloves when handling laundry from the patient. Read and follow directions on labels of laundry or clothing items and detergent. In general, wash and dry with the warmest temperatures recommended on the label.  Clean all areas the individual has used often Clean all touchable surfaces, such as counters, tabletops, doorknobs, bathroom fixtures, toilets, phones, keyboards, tablets, and bedside tables, every day. Also, clean any surfaces that may  have blood, body fluids, and/or secretions or excretions on them. Wear gloves when cleaning surfaces the patient has come in contact with. Use a diluted bleach solution (e.g., dilute bleach with 1 part bleach and 10 parts water) or a household disinfectant with a label that says EPA-registered for coronaviruses. To make a bleach solution at home, add 1 tablespoon of bleach to 1 quart (4 cups) of water. For a larger supply, add  cup of bleach to 1 gallon (16 cups) of water. Read labels of cleaning products and follow recommendations provided on product labels. Labels contain instructions for safe and effective use of the cleaning product including precautions you should take when applying the product, such as wearing gloves or eye protection and making sure you have good ventilation during use of the product. Remove gloves and wash hands immediately after cleaning.  Monitor yourself for signs and symptoms of illness Caregivers and household members are considered close contacts, should monitor their health, and will be asked to limit movement outside of the home to the extent possible. Follow the monitoring steps for close contacts listed on the symptom monitoring form.   ? If you have additional questions, contact your local health department or call the epidemiologist on call at [phone] (available 24/7). ? This guidance is subject to change. For the most up-to-date guidance from [facility], please refer to their website: [email or web address]

## 2019-10-24 NOTE — ED Notes (Signed)
Date and time results received: 10/24/19  (use smartphrase ".now" to insert current time)  Test: Lactic Critical Value: 3.2  Name of Provider Notified: Tegeler

## 2019-10-24 NOTE — Telephone Encounter (Signed)
Received referral from [facility] for consideration of monoclonal antibody treatment for patient as he meets at risk criteria based on his BMI, age, cancer history, diabetes, and hypertension.  I left a message on his machine for him to call us back at [phone] to discuss further.    My chart message sent.    Wilber Bihari, NP

## 2019-10-24 NOTE — ED Notes (Signed)
Patient ambulated in room. SAT was 92% sitting, and 95-96% standing and moving around room. No SOB noted

## 2019-10-24 NOTE — ED Notes (Signed)
Pt is sweaty, skin is damp ,

## 2019-10-25 ENCOUNTER — Other Ambulatory Visit: Payer: Self-pay | Admitting: Physician Assistant

## 2019-10-25 DIAGNOSIS — I10 Essential (primary) hypertension: Secondary | ICD-10-CM

## 2019-10-25 DIAGNOSIS — U071 COVID-19: Secondary | ICD-10-CM

## 2019-10-25 NOTE — Progress Notes (Signed)
I connected by phone with John [name] on 10/25/2019 at 12:44 PM to discuss the potential use of a new treatment for mild to moderate COVID-19 viral infection in non-hospitalized patients.  This patient is a 74 y.o. male that meets the FDA criteria for Emergency Use Authorization of COVID monoclonal antibody casirivimab/imdevimab.  Has a (+) direct SARS-CoV-2 viral test result  Has mild or moderate COVID-19   Is NOT hospitalized due to COVID-19  Is within 10 days of symptom onset  Has at least one of the high risk factor(s) for progression to severe COVID-19 and/or hospitalization as defined in EUA.  Specific high risk criteria : Older age (>/= 73 yo)   I have spoken and communicated the following to the patient or parent/caregiver regarding COVID monoclonal antibody treatment:  1. FDA has authorized the emergency use for the treatment of mild to moderate COVID-19 in adults and pediatric patients with positive results of direct SARS-CoV-2 viral testing who are 4 years of age and older weighing at least 40 kg, and who are at high risk for progressing to severe COVID-19 and/or hospitalization.  2. The significant known and potential risks and benefits of COVID monoclonal antibody, and the extent to which such potential risks and benefits are unknown.  3. Information on available alternative treatments and the risks and benefits of those alternatives, including clinical trials.  4. Patients treated with COVID monoclonal antibody should continue to self-isolate and use infection control measures (e.g., wear mask, isolate, social distance, avoid sharing personal items, clean and disinfect "high touch" surfaces, and frequent handwashing) according to CDC guidelines.   5. The patient or parent/caregiver has the option to accept or refuse COVID monoclonal antibody treatment.  After reviewing this information with the patient, The patient agreed to proceed with receiving casirivimab\imdevimab  infusion and will be provided a copy of the Fact sheet prior to receiving the infusion.   Sx onset 7/12. Set up for [facility] infusion on 7/17 @ 10:30am. Directions given to new [facility] infusion center. Pt fully vaccinated, breakthrough report filled out.  [name] 10/25/2019 12:44 PM

## 2019-10-26 ENCOUNTER — Inpatient Hospital Stay (HOSPITAL_COMMUNITY): Payer: Medicare Other

## 2019-10-26 ENCOUNTER — Emergency Department (HOSPITAL_COMMUNITY): Payer: Medicare Other

## 2019-10-26 ENCOUNTER — Inpatient Hospital Stay (HOSPITAL_COMMUNITY)
Admission: EM | Admit: 2019-10-26 | Discharge: 2019-10-28 | DRG: 177 | Disposition: A | Discharge status: Home / Self Care | Payer: Medicare Other | Attending: Family Medicine | Admitting: Family Medicine

## 2019-10-26 ENCOUNTER — Other Ambulatory Visit: Payer: Self-pay

## 2019-10-26 ENCOUNTER — Encounter (HOSPITAL_COMMUNITY): Payer: Self-pay | Admitting: Internal Medicine

## 2019-10-26 DIAGNOSIS — Z79810 Long term (current) use of selective estrogen receptor modulators (SERMs): Secondary | ICD-10-CM | POA: Diagnosis not present

## 2019-10-26 DIAGNOSIS — E1121 Type 2 diabetes mellitus with diabetic nephropathy: Secondary | ICD-10-CM | POA: Diagnosis present

## 2019-10-26 DIAGNOSIS — F329 Major depressive disorder, single episode, unspecified: Secondary | ICD-10-CM | POA: Diagnosis present

## 2019-10-26 DIAGNOSIS — J9601 Acute respiratory failure with hypoxia: Secondary | ICD-10-CM | POA: Diagnosis present

## 2019-10-26 DIAGNOSIS — Z853 Personal history of malignant neoplasm of breast: Secondary | ICD-10-CM | POA: Diagnosis not present

## 2019-10-26 DIAGNOSIS — R531 Weakness: Secondary | ICD-10-CM

## 2019-10-26 DIAGNOSIS — E1140 Type 2 diabetes mellitus with diabetic neuropathy, unspecified: Secondary | ICD-10-CM | POA: Diagnosis present

## 2019-10-26 DIAGNOSIS — J1282 Pneumonia due to coronavirus disease 2019: Secondary | ICD-10-CM | POA: Diagnosis present

## 2019-10-26 DIAGNOSIS — Z79899 Other long term (current) drug therapy: Secondary | ICD-10-CM

## 2019-10-26 DIAGNOSIS — U071 COVID-19: Secondary | ICD-10-CM | POA: Diagnosis present

## 2019-10-26 DIAGNOSIS — Z823 Family history of stroke: Secondary | ICD-10-CM | POA: Diagnosis not present

## 2019-10-26 DIAGNOSIS — I10 Essential (primary) hypertension: Secondary | ICD-10-CM | POA: Diagnosis present

## 2019-10-26 DIAGNOSIS — Z833 Family history of diabetes mellitus: Secondary | ICD-10-CM

## 2019-10-26 DIAGNOSIS — Z8041 Family history of malignant neoplasm of ovary: Secondary | ICD-10-CM | POA: Diagnosis not present

## 2019-10-26 DIAGNOSIS — Z7982 Long term (current) use of aspirin: Secondary | ICD-10-CM | POA: Diagnosis not present

## 2019-10-26 DIAGNOSIS — K219 Gastro-esophageal reflux disease without esophagitis: Secondary | ICD-10-CM | POA: Diagnosis present

## 2019-10-26 DIAGNOSIS — N400 Enlarged prostate without lower urinary tract symptoms: Secondary | ICD-10-CM | POA: Diagnosis present

## 2019-10-26 DIAGNOSIS — G9341 Metabolic encephalopathy: Secondary | ICD-10-CM | POA: Diagnosis present

## 2019-10-26 DIAGNOSIS — Z794 Long term (current) use of insulin: Secondary | ICD-10-CM | POA: Diagnosis not present

## 2019-10-26 DIAGNOSIS — Z807 Family history of other malignant neoplasms of lymphoid, hematopoietic and related tissues: Secondary | ICD-10-CM | POA: Diagnosis not present

## 2019-10-26 DIAGNOSIS — Z888 Allergy status to other drugs, medicaments and biological substances status: Secondary | ICD-10-CM | POA: Diagnosis not present

## 2019-10-26 LAB — CBC WITH DIFFERENTIAL/PLATELET
Abs Immature Granulocytes: 0.02 10*3/uL (ref 0.00–0.07)
Basophils Absolute: 0 10*3/uL (ref 0.0–0.1)
Basophils Relative: 0 %
Eosinophils Absolute: 0 10*3/uL (ref 0.0–0.5)
Eosinophils Relative: 0 %
HCT: 41 % (ref 39.0–52.0)
Hemoglobin: 13.7 g/dL (ref 13.0–17.0)
Immature Granulocytes: 1 %
Lymphocytes Relative: 12 %
Lymphs Abs: 0.5 10*3/uL — ABNORMAL LOW (ref 0.7–4.0)
MCH: 31 pg (ref 26.0–34.0)
MCHC: 33.4 g/dL (ref 30.0–36.0)
MCV: 92.8 fL (ref 80.0–100.0)
Monocytes Absolute: 0.5 10*3/uL (ref 0.1–1.0)
Monocytes Relative: 11 %
Neutro Abs: 3.3 10*3/uL (ref 1.7–7.7)
Neutrophils Relative %: 76 %
Platelets: 150 10*3/uL (ref 150–400)
RBC: 4.42 MIL/uL (ref 4.22–5.81)
RDW: 14 % (ref 11.5–15.5)
WBC: 4.3 10*3/uL (ref 4.0–10.5)
nRBC: 0 % (ref 0.0–0.2)

## 2019-10-26 LAB — COMPREHENSIVE METABOLIC PANEL
ALT: 46 U/L — ABNORMAL HIGH (ref 0–44)
AST: 48 U/L — ABNORMAL HIGH (ref 15–41)
Albumin: 3.3 g/dL — ABNORMAL LOW (ref 3.5–5.0)
Alkaline Phosphatase: 35 U/L — ABNORMAL LOW (ref 38–126)
Anion gap: 10 (ref 5–15)
BUN: 16 mg/dL (ref 8–23)
CO2: 26 mmol/L (ref 22–32)
Calcium: 8.3 mg/dL — ABNORMAL LOW (ref 8.9–10.3)
Chloride: 100 mmol/L (ref 98–111)
Creatinine, Ser: 0.86 mg/dL (ref 0.61–1.24)
GFR calc Af Amer: >60 mL/min (ref >60)
GFR calc non Af Amer: >60 mL/min (ref >60)
Glucose, Bld: 202 mg/dL — ABNORMAL HIGH (ref 70–99)
Potassium: 3.8 mmol/L (ref 3.5–5.1)
Sodium: 136 mmol/L (ref 135–145)
Total Bilirubin: 0.8 mg/dL (ref 0.3–1.2)
Total Protein: 6 g/dL — ABNORMAL LOW (ref 6.5–8.1)

## 2019-10-26 LAB — URINE CULTURE: Culture: 30000 — AB

## 2019-10-26 LAB — FIBRINOGEN: Fibrinogen: 487 mg/dL — ABNORMAL HIGH (ref 210–475)

## 2019-10-26 LAB — GLUCOSE, CAPILLARY: Glucose-Capillary: 239 mg/dL — ABNORMAL HIGH (ref 70–99)

## 2019-10-26 LAB — D-DIMER, QUANTITATIVE: D-Dimer, Quant: 0.58 ug/mL-FEU — ABNORMAL HIGH (ref 0.00–0.50)

## 2019-10-26 LAB — PROCALCITONIN: Procalcitonin: <0.1 ng/mL

## 2019-10-26 LAB — TRIGLYCERIDES: Triglycerides: 118 mg/dL (ref <150)

## 2019-10-26 LAB — CBG MONITORING, ED: Glucose-Capillary: 208 mg/dL — ABNORMAL HIGH (ref 70–99)

## 2019-10-26 LAB — LACTIC ACID, PLASMA: Lactic Acid, Venous: 1.4 mmol/L (ref 0.5–1.9)

## 2019-10-26 LAB — LACTATE DEHYDROGENASE: LDH: 147 U/L (ref 98–192)

## 2019-10-26 LAB — FERRITIN: Ferritin: 379 ng/mL — ABNORMAL HIGH (ref 24–336)

## 2019-10-26 LAB — C-REACTIVE PROTEIN: CRP: 3.9 mg/dL — ABNORMAL HIGH (ref <1.0)

## 2019-10-26 MED ORDER — ALBUTEROL SULFATE HFA 108 (90 BASE) MCG/ACT IN AERS
2.0000 | INHALATION_SPRAY | Freq: Four times a day (QID) | RESPIRATORY_TRACT | Status: DC
  Administered 2019-10-26: 2 via RESPIRATORY_TRACT
  Filled 2019-10-26 (×2): qty 6.7

## 2019-10-26 MED ORDER — ASCORBIC ACID 500 MG PO TABS
500.0000 mg | ORAL_TABLET | Freq: Every day | ORAL | Status: DC
  Administered 2019-10-26 – 2019-10-28 (×3): 500 mg via ORAL
  Filled 2019-10-26 (×3): qty 1

## 2019-10-26 MED ORDER — IOHEXOL 300 MG/ML  SOLN
75.0000 mL | Freq: Once | INTRAMUSCULAR | Status: AC | PRN
  Administered 2019-10-26: 75 mL via INTRAVENOUS

## 2019-10-26 MED ORDER — GABAPENTIN 300 MG PO CAPS
300.0000 mg | ORAL_CAPSULE | Freq: Every day | ORAL | Status: DC
  Administered 2019-10-26 – 2019-10-28 (×12): 300 mg via ORAL
  Filled 2019-10-26 (×12): qty 1

## 2019-10-26 MED ORDER — AMLODIPINE BESYLATE 5 MG PO TABS
5.0000 mg | ORAL_TABLET | Freq: Every day | ORAL | Status: DC
  Administered 2019-10-26 – 2019-10-28 (×3): 5 mg via ORAL
  Filled 2019-10-26 (×3): qty 1

## 2019-10-26 MED ORDER — INSULIN ASPART 100 UNIT/ML ~~LOC~~ SOLN
0.0000 [IU] | Freq: Three times a day (TID) | SUBCUTANEOUS | Status: DC
  Administered 2019-10-26 – 2019-10-27 (×2): 5 [IU] via SUBCUTANEOUS
  Administered 2019-10-27: 8 [IU] via SUBCUTANEOUS
  Administered 2019-10-27: 11 [IU] via SUBCUTANEOUS
  Administered 2019-10-28: 3 [IU] via SUBCUTANEOUS
  Administered 2019-10-28: 5 [IU] via SUBCUTANEOUS
  Filled 2019-10-26: qty 0.15

## 2019-10-26 MED ORDER — ACETAMINOPHEN 325 MG PO TABS
650.0000 mg | ORAL_TABLET | Freq: Once | ORAL | Status: AC
  Administered 2019-10-26: 650 mg via ORAL
  Filled 2019-10-26: qty 2

## 2019-10-26 MED ORDER — INSULIN GLARGINE 100 UNIT/ML ~~LOC~~ SOLN
60.0000 [IU] | Freq: Every day | SUBCUTANEOUS | Status: DC
  Administered 2019-10-26 – 2019-10-27 (×2): 60 [IU] via SUBCUTANEOUS
  Filled 2019-10-26 (×3): qty 0.6

## 2019-10-26 MED ORDER — HYDROCOD POLST-CPM POLST ER 10-8 MG/5ML PO SUER
5.0000 mL | Freq: Two times a day (BID) | ORAL | Status: DC | PRN
  Filled 2019-10-26: qty 5

## 2019-10-26 MED ORDER — DEXAMETHASONE SODIUM PHOSPHATE 10 MG/ML IJ SOLN
6.0000 mg | INTRAMUSCULAR | Status: DC
  Administered 2019-10-26 – 2019-10-27 (×2): 6 mg via INTRAVENOUS
  Filled 2019-10-26 (×2): qty 1

## 2019-10-26 MED ORDER — INSULIN ASPART 100 UNIT/ML ~~LOC~~ SOLN
0.0000 [IU] | Freq: Every day | SUBCUTANEOUS | Status: DC
  Administered 2019-10-26 – 2019-10-27 (×2): 2 [IU] via SUBCUTANEOUS
  Filled 2019-10-26: qty 0.05

## 2019-10-26 MED ORDER — ENOXAPARIN SODIUM 40 MG/0.4ML ~~LOC~~ SOLN
40.0000 mg | SUBCUTANEOUS | Status: DC
  Administered 2019-10-27: 40 mg via SUBCUTANEOUS
  Filled 2019-10-26 (×2): qty 0.4

## 2019-10-26 MED ORDER — SODIUM CHLORIDE 0.9 % IV SOLN
100.0000 mg | Freq: Every day | INTRAVENOUS | Status: DC
  Administered 2019-10-27 – 2019-10-28 (×2): 100 mg via INTRAVENOUS
  Filled 2019-10-26 (×2): qty 20

## 2019-10-26 MED ORDER — SODIUM CHLORIDE 0.9 % IV SOLN
100.0000 mg | INTRAVENOUS | Status: AC
  Administered 2019-10-26 (×2): 100 mg via INTRAVENOUS
  Filled 2019-10-26 (×2): qty 20

## 2019-10-26 MED ORDER — SODIUM CHLORIDE 0.9 % IV SOLN: 200.0000 mg | Freq: Once | INTRAVENOUS | Status: DC

## 2019-10-26 MED ORDER — SODIUM CHLORIDE 0.9 % IV SOLN: 100.0000 mg | Freq: Every day | INTRAVENOUS | Status: DC

## 2019-10-26 MED ORDER — ZINC SULFATE 220 (50 ZN) MG PO CAPS
220.0000 mg | ORAL_CAPSULE | Freq: Every day | ORAL | Status: DC
  Administered 2019-10-26 – 2019-10-28 (×3): 220 mg via ORAL
  Filled 2019-10-26 (×3): qty 1

## 2019-10-26 MED ORDER — PANTOPRAZOLE SODIUM 40 MG PO TBEC
40.0000 mg | DELAYED_RELEASE_TABLET | Freq: Every day | ORAL | Status: DC
  Administered 2019-10-26 – 2019-10-28 (×3): 40 mg via ORAL
  Filled 2019-10-26 (×3): qty 1

## 2019-10-26 MED ORDER — GUAIFENESIN-DM 100-10 MG/5ML PO SYRP: 10.0000 mL | ORAL_SOLUTION | ORAL | Status: DC | PRN

## 2019-10-26 NOTE — H&P (Signed)
History and Physical    John [name] AJG:[id number] DOB: 11/09/1945 DOA: 10/26/2019  PCP: Shelda Pal, DO  Patient coming from: Home  Chief Complaint: AMS  HPI: John [name] is a 74 y.o. male with medical history significant of recently diagnosed COVID on 7/14 who presents from home with increased confusion. Pt did receive both Pfeizer vaccines earlier this year. Pt admits to not wearing mask routinely since receiving vaccine. Recently, pt visited his dying sister-in-law at a hospital in [facility] No known sick contacts. Pt presented to ED on 7/14 with complaints of n/v and fevers. Covid test that day returned positive. Pt was able to pass PO challenge and was referred to outpatient monoclonal ab treatment and subsequently discharged home. Pt's mentation gradually worsened and patient was brought back to ED for further work up.  ED Course: In the ED, pt found to be hypoxemic with O2 sats down to 85% while asleep, 88% awake, requiring supplemental O2. CXR was found to be clear. Pt was noted to be febrile to 101.23F in the ED. Hospitalist consulted for consideration for admission  Review of Systems:  Review of Systems  Constitutional: Positive for fever and malaise/fatigue.  HENT: Negative for congestion, ear pain and tinnitus.   Eyes: Negative for double vision and photophobia.  Respiratory: Positive for shortness of breath. Negative for hemoptysis.   Cardiovascular: Negative for palpitations, orthopnea and claudication.  Gastrointestinal: Negative for abdominal pain and diarrhea.  Genitourinary: Negative for frequency, hematuria and urgency.  Musculoskeletal: Negative for back pain, falls and neck pain.  Neurological: Negative for tingling, tremors, seizures and loss of consciousness.  Psychiatric/Behavioral: Negative for hallucinations and memory loss. The patient is not nervous/anxious.     Past Medical History:  Diagnosis Date  . BPH (benign prostatic hyperplasia)   . CTS  (carpal tunnel syndrome)   . Depression   . Diabetes mellitus   . Essential hypertension   . GERD (gastroesophageal reflux disease)   . History of breast cancer   . Hypercholesteremia   . Neuropathy     Past Surgical History:  Procedure Laterality Date  . CATARACT EXTRACTION, BILATERAL  01/18/2017  . LITHOTRIPSY    . MASTECTOMY Right 05/02/2018  . TONSILLECTOMY AND ADENOIDECTOMY       reports that he has never smoked. He has never used smokeless tobacco. He reports that he does not drink alcohol and does not use drugs.  Allergies  Allergen Reactions  . Ramipril Anaphylaxis  . Atorvastatin     Other reaction(s): Other (See Comments) Calf cramps  . Other Diarrhea    Severe intolerance to Chemotherapy in the past.  . Adhesive [Tape] Rash    Family History  Problem Relation Age of Onset  . Lymphoma Mother   . Diabetes Father   . Stroke Father   . Ovarian cancer Sister   . Cancer Paternal Grandmother     Prior to Admission medications   Medication Sig Start Date End Date Taking? Authorizing Provider  acetaminophen (TYLENOL) 650 MG CR tablet Take 1,300 mg by mouth every 8 (eight) hours as needed for pain.   Yes [provider]  amLODipine (NORVASC) 5 MG tablet Take 5 mg by mouth daily.   Yes [provider]  Ascorbic Acid (VITAMIN C) 1000 MG tablet Take 1,000 mg by mouth in the morning and at bedtime.   Yes [provider]  aspirin 325 MG EC tablet Take 325 mg by mouth daily.   Yes [provider]  FLUoxetine (PROZAC) 20 MG capsule Take 20 mg by mouth daily.   Yes [provider]  gabapentin (NEURONTIN) 300 MG capsule Take 300 mg by mouth 6 (six) times daily.   Yes [provider]  insulin glargine (LANTUS) 100 UNIT/ML injection Inject 60 Units into the skin daily.    Yes [provider]  metFORMIN (GLUCOPHAGE-XR) 500 MG 24 hr tablet Take 500 mg by mouth in the morning, at noon, and at bedtime.   Yes [provider]  Multiple Vitamin (MULTIVITAMIN) tablet Take 1 tablet by mouth daily.   Yes [provider]  omeprazole (PRILOSEC) 40 MG capsule Take 40 mg by mouth daily.   Yes [provider]  ondansetron (ZOFRAN) 4 MG tablet Take 1 tablet (4 mg total) by mouth every 8 (eight) hours as needed for nausea or vomiting. 10/24/19  Yes Tegeler, Gwenyth Allegra, MD  tamoxifen (NOLVADEX) 20 MG tablet Take 20 mg by mouth daily.   Yes [provider]    Physical Exam: Vitals:   10/26/19 1236 10/26/19 1318 10/26/19 1330 10/26/19 1400  BP: (!) 141/60 (!) 126/56 (!) 145/73 (!) 142/67  Pulse: 83 85 79 76  Resp: (!) 25 (!) 23 (!) 23 (!) 26  Temp:      TempSrc:      SpO2: 94% 96% (!) 88% 93%  Weight:      Height:        Constitutional: NAD, calm, comfortable Vitals:   10/26/19 1236 10/26/19 1318 10/26/19 1330 10/26/19 1400  BP: (!) 141/60 (!) 126/56 (!) 145/73 (!) 142/67  Pulse: 83 85 79 76  Resp: (!) 25 (!) 23 (!) 23 (!) 26  Temp:      TempSrc:      SpO2: 94% 96% (!) 88% 93%  Weight:      Height:       Eyes: PERRL, lids and conjunctivae normal ENMT: Mucous membranes are moist. Dentition fair Neck: normal, supple, no masses, no thyromegaly Respiratory: clear to auscultation bilaterally, no wheezing, normal resp effort while on supplemental O2 Cardiovascular: Regular rate and rhythm, s1, s2 Abdomen: no tenderness, no masses palpated. No hepatosplenomegaly. Bowel sounds positive.  Musculoskeletal: no clubbing / cyanosis. No joint deformity upper and lower extremities. Good ROM, no contractures. Normal muscle tone.  Skin: no rashes, lesions. No induration Neurologic: CN 2-12 grossly intact. Sensation intact. Strength 5/5 in all 4.  Psychiatric: Normal judgment and insight. Alert and oriented x 3. Normal mood.    Labs on Admission: I have personally reviewed following labs and imaging studies  CBC: Recent Labs  Lab 10/24/19 0844 10/26/19 1203  WBC 6.4 4.3   NEUTROABS  --  3.3  HGB 14.0 13.7  HCT 40.5 41.0  MCV 90.6 92.8  PLT 181 563   Basic Metabolic Panel: Recent Labs  Lab 10/24/19 0844 10/26/19 1203  NA 133* 136  K 3.8 3.8  CL 96* 100  CO2 24 26  GLUCOSE 285* 202*  BUN 14 16  CREATININE 1.04 0.86  CALCIUM 8.7* 8.3*   GFR: Estimated Creatinine Clearance: 97.5 mL/min (by C-G formula based on SCr of 0.86 mg/dL). Liver Function Tests: Recent Labs  Lab 10/24/19 0844 10/26/19 1203  AST 48* 48*  ALT 41 46*  ALKPHOS 37* 35*  BILITOT 1.0 0.8  PROT 6.4* 6.0*  ALBUMIN 3.6 3.3*   Recent Labs  Lab 10/24/19 0844  LIPASE 22   No results for input(s): AMMONIA in the last 168 hours. Coagulation Profile: No  results for input(s): INR, PROTIME in the last 168 hours. Cardiac Enzymes: No results for input(s): CKTOTAL, CKMB, CKMBINDEX, TROPONINI in the last 168 hours. BNP (last 3 results) No results for input(s): PROBNP in the last 8760 hours. HbA1C: No results for input(s): HGBA1C in the last 72 hours. CBG: No results for input(s): GLUCAP in the last 168 hours. Lipid Profile: Recent Labs    10/26/19 1203  TRIG 118   Thyroid Function Tests: No results for input(s): TSH, T4TOTAL, FREET4, T3FREE, THYROIDAB in the last 72 hours. Anemia Panel: Recent Labs    10/26/19 1203  FERRITIN 379*   Urine analysis:    Component Value Date/Time   COLORURINE YELLOW 10/24/2019 1100   APPEARANCEUR CLEAR 10/24/2019 1100   LABSPEC 1.025 10/24/2019 1100   PHURINE 6.0 10/24/2019 1100   GLUCOSEU >=500 (A) 10/24/2019 1100   HGBUR NEGATIVE 10/24/2019 1100   BILIRUBINUR NEGATIVE 10/24/2019 1100   KETONESUR 15 (A) 10/24/2019 1100   PROTEINUR NEGATIVE 10/24/2019 1100   NITRITE NEGATIVE 10/24/2019 1100   LEUKOCYTESUR NEGATIVE 10/24/2019 1100   Sepsis Labs: !!!!!!!!!!!!!!!!!!!!!!!!!!!!!!!!!!!!!!!!!!!! @LABRCNTIP (procalcitonin:4,lacticidven:4) ) Recent Results (from the past 240 hour(s))  SARS Coronavirus 2 by RT PCR (hospital order,  performed in [place] hospital lab) Nasopharyngeal Nasopharyngeal Swab     Status: Abnormal   Collection Time: 10/24/19 10:04 AM   Specimen: Nasopharyngeal Swab  Result Value Ref Range Status   SARS Coronavirus 2 POSITIVE (A) NEGATIVE Final    Comment: RESULT CALLED TO, READ BACK BY AND VERIFIED WITH: CALLED TO R.MARCUM AT [place] ON [id number] (NOTE) SARS-CoV-2 target nucleic acids are DETECTED  SARS-CoV-2 RNA is generally detectable in upper respiratory specimens  during the acute phase of infection.  Positive results are indicative  of the presence of the identified virus, but do not rule out bacterial infection or co-infection with other pathogens not detected by the test.  Clinical correlation with patient history and  other diagnostic information is necessary to determine patient infection status.  The expected result is negative.  Fact Sheet for Patients:   StrictlyIdeas.no   Fact Sheet for Healthcare Providers:   BankingDealers.co.za    This test is not yet approved or cleared by the [place] FDA and  has been authorized for detection and/or diagnosis of SARS-CoV-2 by FDA under an Emergency Use Authorization (EUA).  This EUA will remain in effect (meani ng this test can be used) for the duration of  the COVID-19 declaration under Section 564(b)(1) of the Act, 21 U.S.C. section 360-bbb-3(b)(1), unless the authorization is terminated or revoked sooner.  Performed at [facility], [address] [place], [place] [address]   Urine culture     Status: Abnormal   Collection Time: 10/24/19 11:00 AM   Specimen: Urine, Clean Catch  Result Value Ref Range Status   Specimen Description   Final    URINE, CLEAN CATCH Performed at [facility], [address] [place], [place] [address]    Special Requests   Final    NONE Performed at [facility], [address] [place], [place]  [address]    Culture 30,000 COLONIES/mL ESCHERICHIA COLI (A)  Final   Report Status 10/26/2019 FINAL  Final   Organism ID, Bacteria ESCHERICHIA COLI (A)  Final      Susceptibility   Escherichia coli - MIC*    AMPICILLIN <=2 SENSITIVE Sensitive     CEFAZOLIN <=4 SENSITIVE Sensitive     CEFTRIAXONE <=  0.25 SENSITIVE Sensitive     CIPROFLOXACIN <=0.25 SENSITIVE Sensitive     GENTAMICIN <=1 SENSITIVE Sensitive     IMIPENEM <=0.25 SENSITIVE Sensitive     NITROFURANTOIN <=16 SENSITIVE Sensitive     TRIMETH/SULFA <=20 SENSITIVE Sensitive     AMPICILLIN/SULBACTAM <=2 SENSITIVE Sensitive     PIP/TAZO <=4 SENSITIVE Sensitive     * 30,000 COLONIES/mL ESCHERICHIA COLI     Radiological Exams on Admission: DG Chest Port 1 View  Result Date: 10/26/2019 CLINICAL DATA:  COVID-19 positive, cough. EXAM: PORTABLE CHEST 1 VIEW COMPARISON:  October 24, 2019. FINDINGS: The heart size and mediastinal contours are within normal limits. Both lungs are clear. The visualized skeletal structures are unremarkable. IMPRESSION: No active disease. Electronically Signed   By: Marijo Conception M.D.   On: 10/26/2019 13:56    EKG: Independently reviewed. Sinus  Assessment/Plan Principal Problem:   Pneumonia due to COVID-19 virus Active Problems:   Essential hypertension   History of breast cancer   GERD (gastroesophageal reflux disease)   BPH (benign prostatic hyperplasia)   1. COVID PNA 1. Diagnosed on 7/14 and discharged home as pt was able to maintain O2 sats 2. Now hypoxemic with O2 sats of 89% on RA.  3. CRP 3.9, LDH 147, d-dimer 487 4. CXR reviewed, clear. Will order f/u CT chest 5. Will start decadron x 10 day course 6. Ordered and reviewed follow up Chest CT with biapical ground-glass pulm infiltrates 7. Have ordered remdesivir, plan for 5 day course 8. Follow inflammatory markers in AM 9. Wean O2 as tolerated 2. HTN 1. BP currently stable 2. Cont to monitor 3. Cont home norvasc 3. Hx breast  cancer 1. Seems stable at this time 4. GERD 1. Seems to be stable 5. BPH 1. Seems stable currently 6. DM 1. On lantus 60 units daily and metformin prior to admit 2. Random glucose 202 3. Will continue home lantus and SSI coverage while in hospital 4. Will check a1c  DVT prophylaxis: Lovenox subq  Code Status: Full Family Communication: Pt in room, family not at bedside  Disposition Plan: Uncertain at this time  Consults called:  Admission status: Inpatient, as would likely require greater than 2 midnight stay for IV treatment of COVID PNA, high mortality   Marylu Lund MD [facility] Pager On Amion  If 7PM-7AM, please contact night-coverage  10/26/2019, 2:39 PM

## 2019-10-26 NOTE — ED Notes (Signed)
Nurse unable to take report at this time, information given for nurse to call back for report.

## 2019-10-26 NOTE — ED Notes (Signed)
Pt O2 sats noted to be 85% on room air while pt asleep.  Pt awoken, O2 sats increased to 88% on RA.  Pt placed on 3 L [place], O2 increased to 96%.  Will continue to monitor.

## 2019-10-26 NOTE — Plan of Care (Signed)

## 2019-10-26 NOTE — ED Notes (Signed)
Attempted report to 4W.  Per Network engineer, unit unable to approve bed request at this time due to staffing issues.  Will approve bed once RN available to accept pt.

## 2019-10-26 NOTE — ED Notes (Signed)
Pt assisted to transfer to chair for dinner, one person assistance.  Sheets on stretcher changed, as well as pt gown.  Pt provided fan.  No other needs expressed.

## 2019-10-26 NOTE — ED Notes (Addendum)
Per [facility] in  [place] to administer novolog SSI at same time as lantus.

## 2019-10-26 NOTE — ED Triage Notes (Addendum)
Pt BIBA from home.   Per EMS- Pt dx with COVID 10/24/19; gradually worsening symptoms.  Family reports AMS, lethargic. AOx3 (unsure of date initially), post fluids pt 'much more alert'  O2 sats 90's on room air.   500 mL NaCl 4 mg Zofran NSR EtCO2- 38   Pt reports taking tylenol appx 0730 today.

## 2019-10-26 NOTE — ED Provider Notes (Signed)
[facility] DEPT Provider Note   CSN: [id number] Arrival date & time: 10/26/19  1047     History Chief Complaint  Patient presents with  . Altered Mental Status  . Fatigue  . Fever    John [name] is a 74 y.o. male.  He has a history of diabetes hypertension had a history of breast cancer status post excision.  He was seen on the 14th with nausea and vomiting and a Covid contact.  He was Covid positive.  He had a pulse ox between 88 and 95% and ultimately elected to go home.  He called the infusion center and is supposed to have an infusion today.  He has been having progressively increased generalized weakness fatigue nonproductive cough and fevers.  Last took Tylenol at 730 this morning.  He was confused today thought it was afternoon.  Not eating much.  No chest pain or abdominal pain.  Had some dry heaves.  No diarrhea.  Received his Pfizer vaccine in January February.   The history is provided by the patient.  Influenza Presenting symptoms: cough, fatigue, fever, myalgias, nausea and vomiting   Presenting symptoms: no diarrhea, no rhinorrhea, no shortness of breath and no sore throat   Cough:    Cough characteristics:  Non-productive   Severity:  Moderate   Onset quality:  Gradual   Timing:  Intermittent   Progression:  Unchanged   Chronicity:  New Fatigue:    Severity:  Moderate   Timing:  Constant   Progression:  Worsening Fever:    Timing:  Intermittent   Progression:  Unchanged Associated symptoms: decreased appetite, decreased physical activity and mental status change   Associated symptoms: no neck stiffness   Risk factors: diabetes and sick contacts        Past Medical History:  Diagnosis Date  . BPH (benign prostatic hyperplasia)   . CTS (carpal tunnel syndrome)   . Depression   . Diabetes mellitus   . Essential hypertension   . GERD (gastroesophageal reflux disease)   . History of breast cancer   . Hypercholesteremia   .  Neuropathy     Patient Active Problem List   Diagnosis Date Noted  . 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 Essential hypertension 10/17/2019  . Depression, major, single episode, complete remission ([place]) 10/17/2019  . History of breast cancer   . GERD (gastroesophageal reflux disease)   . BPH (benign prostatic hyperplasia)   . CTS (carpal tunnel syndrome)     Past Surgical History:  Procedure Laterality Date  . CATARACT EXTRACTION, BILATERAL  01/18/2017  . LITHOTRIPSY    . MASTECTOMY Right 05/02/2018  . TONSILLECTOMY AND ADENOIDECTOMY         Family History  Problem Relation Age of Onset  . Lymphoma Mother   . Diabetes Father   . Stroke Father   . Ovarian cancer Sister   . Cancer Paternal Grandmother     Social History   Tobacco Use  . Smoking status: Never Smoker  . Smokeless tobacco: Never Used  Substance Use Topics  . Alcohol use: No  . Drug use: No    Home Medications Prior to Admission medications   Medication Sig Start Date End Date Taking? Authorizing Provider  amLODipine (NORVASC) 5 MG tablet Take 5 mg by mouth daily.    [provider]  aspirin 325 MG EC tablet Take 325 mg by mouth daily.    [provider]  Dulaglutide (TRULICITY) 1.5 GG/2.6RS  SOPN Inject 1.5 mg into the skin.    [provider]  FLUoxetine (PROZAC) 20 MG capsule Take 20 mg by mouth daily.    [provider]  gabapentin (NEURONTIN) 300 MG capsule Take 300 mg by mouth 6 (six) times daily.    [provider]  insulin glargine (LANTUS) 100 UNIT/ML injection Inject 80 Units into the skin daily.    [provider]  metFORMIN (GLUCOPHAGE-XR) 500 MG 24 hr tablet Take 500 mg by mouth in the morning, at noon, and at bedtime.    [provider]  Multiple Vitamin (MULTIVITAMIN) tablet Take 1 tablet by mouth daily.    [provider]  omeprazole (PRILOSEC) 40 MG capsule Take 40 mg by mouth daily.    [provider]  ondansetron (ZOFRAN) 4 MG tablet Take 1 tablet (4 mg total) by mouth every 8 (eight) hours as needed for nausea or vomiting. 10/24/19   Tegeler, Gwenyth Allegra, MD  tamoxifen (NOLVADEX) 20 MG tablet Take 20 mg by mouth daily.    [provider]  vitamin C (ASCORBIC ACID) 500 MG tablet Take 1,500 mg by mouth daily.    [provider]    Allergies    Ramipril  Review of Systems   Review of Systems  Constitutional: Positive for decreased appetite, fatigue and fever.  HENT: Negative for rhinorrhea and sore throat.   Eyes: Negative for visual disturbance.  Respiratory: Positive for cough. Negative for shortness of breath.   Cardiovascular: Negative for chest pain.  Gastrointestinal: Positive for nausea and vomiting. Negative for diarrhea.  Genitourinary: Negative for dysuria.  Musculoskeletal: Positive for myalgias. Negative for neck stiffness.  Skin: Negative for rash.  Neurological: Negative for speech difficulty.  Psychiatric/Behavioral: Positive for confusion.    Physical Exam Updated Vital Signs BP 135/67 (BP Location: Right Arm)   Pulse 81   Temp (!) 101.3 F (38.5 C) (Oral)   Resp (!) 29   Ht 6' (1.829 m)   Wt 108.9 kg   SpO2 94%   BMI 32.55 kg/m   Physical Exam Vitals and nursing note reviewed.  Constitutional:      Appearance: Normal appearance. He is well-developed.  HENT:     Head: Normocephalic and atraumatic.  Eyes:     Conjunctiva/sclera: Conjunctivae normal.  Cardiovascular:     Rate and Rhythm: Normal rate and regular rhythm.     Pulses: Normal pulses.     Heart sounds: No murmur heard.   Pulmonary:     Effort: Pulmonary effort is normal. Tachypnea present. No respiratory distress.     Breath sounds: Normal breath sounds.  Abdominal:     Palpations: Abdomen is soft.     Tenderness: There is no abdominal tenderness.  Musculoskeletal:        General: No deformity or signs of injury. Normal range of motion.     Cervical  back: Neck supple.  Skin:    General: Skin is warm and dry.     Capillary Refill: Capillary refill takes less than 2 seconds.  Neurological:     General: No focal deficit present.     Mental Status: He is alert and oriented to person, place, and time.     ED Results / Procedures / Treatments   Labs (all labs ordered are listed, but only abnormal results are displayed) Labs Reviewed  CBC WITH DIFFERENTIAL/PLATELET - Abnormal; Notable for the following components:      Result Value   Lymphs Abs 0.5 (*)  All other components within normal limits  COMPREHENSIVE METABOLIC PANEL - Abnormal; Notable for the following components:   Glucose, Bld 202 (*)    Calcium 8.3 (*)    Total Protein 6.0 (*)    Albumin 3.3 (*)    AST 48 (*)    ALT 46 (*)    Alkaline Phosphatase 35 (*)    All other components within normal limits  D-DIMER, QUANTITATIVE (NOT AT [facility]) - Abnormal; Notable for the following components:   D-Dimer, Quant 0.58 (*)    All other components within normal limits  FERRITIN - Abnormal; Notable for the following components:   Ferritin 379 (*)    All other components within normal limits  FIBRINOGEN - Abnormal; Notable for the following components:   Fibrinogen 487 (*)    All other components within normal limits  C-REACTIVE PROTEIN - Abnormal; Notable for the following components:   CRP 3.9 (*)    All other components within normal limits  CULTURE, BLOOD (ROUTINE X 2)  CULTURE, BLOOD (ROUTINE X 2)  LACTIC ACID, PLASMA  PROCALCITONIN  LACTATE DEHYDROGENASE  TRIGLYCERIDES  HEMOGLOBIN A1C  COMPREHENSIVE METABOLIC PANEL  CBC  C-REACTIVE PROTEIN  D-DIMER, QUANTITATIVE (NOT AT [facility])  FERRITIN  ABO/RH    EKG None  Radiology CT CHEST W CONTRAST  Result Date: 10/26/2019 CLINICAL DATA:  Pneumonia, pleural effusion, COVID-19 positive EXAM: CT CHEST WITH CONTRAST TECHNIQUE: Multidetector CT imaging of the chest was performed during intravenous contrast administration.  CONTRAST:  33mL OMNIPAQUE IOHEXOL 300 MG/ML  SOLN COMPARISON:  None. FINDINGS: Cardiovascular: Extensive multi-vessel coronary artery calcification. Mild global cardiomegaly. There is relative enlargement of the right ventricle as well as enlargement of the central pulmonary arteries in keeping with changes of pulmonary arterial hypertension and elevated right heart pressure. The thoracic aorta is age-appropriate. Mediastinum/Nodes: No pathologic thoracic adenopathy. Lungs/Pleura: Sparse scattered ground-glass pulmonary infiltrates are present predominantly within the upper lobes bilaterally trace bilateral pleural effusions are present. There is bibasilar atelectasis present. No pneumothorax. The central airways are widely patent. Upper Abdomen: Marked Paddock steatosis. Cholelithiasis noted. The gallbladder is distended, however, no pericholecystic inflammatory changes are noted. Musculoskeletal: No acute bone abnormality IMPRESSION: Biapical ground-glass pulmonary infiltrates most in keeping with atypical infection in the acute setting. The findings are not pathognomonic of COVID-19 pneumonia and alternative infection including viral and atypical organism should also be considered. Extensive coronary artery calcification. Morphologic changes in keeping with elevated right heart and pulmonary arterial pressure. Severe hepatic steatosis. Electronically Signed   By: Fidela Salisbury MD   On: 10/26/2019 15:55   DG Chest Port 1 View  Result Date: 10/26/2019 CLINICAL DATA:  COVID-19 positive, cough. EXAM: PORTABLE CHEST 1 VIEW COMPARISON:  October 24, 2019. FINDINGS: The heart size and mediastinal contours are within normal limits. Both lungs are clear. The visualized skeletal structures are unremarkable. IMPRESSION: No active disease. Electronically Signed   By: Marijo Conception M.D.   On: 10/26/2019 13:56    Procedures .Critical Care Performed by: Hayden Rasmussen, MD Authorized by: Hayden Rasmussen, MD    Critical care provider statement:    Critical care time (minutes):  45   Critical care time was exclusive of:  Separately billable procedures and treating other patients   Critical care was necessary to treat or prevent imminent or life-threatening deterioration of the following conditions:  Sepsis and respiratory failure   Critical care was time spent personally by me on the following activities:  Discussions with consultants, evaluation  of patient's response to treatment, examination of patient, ordering and performing treatments and interventions, ordering and review of laboratory studies, ordering and review of radiographic studies, pulse oximetry, re-evaluation of patient's condition, obtaining history from patient or surrogate, review of old charts and development of treatment plan with patient or surrogate   I assumed direction of critical care for this patient from another provider in my specialty: no     (including critical care time)  Medications Ordered in ED Medications  enoxaparin (LOVENOX) injection 40 mg (40 mg Subcutaneous Refused 10/26/19 1652)  dexamethasone (DECADRON) injection 6 mg (6 mg Intravenous Given 10/26/19 1700)  ascorbic acid (VITAMIN C) tablet 500 mg (500 mg Oral Given 10/26/19 1655)  zinc sulfate capsule 220 mg (220 mg Oral Given 10/26/19 1654)  guaiFENesin-dextromethorphan (ROBITUSSIN DM) 100-10 MG/5ML syrup 10 mL (has no administration in time range)  chlorpheniramine-HYDROcodone (TUSSIONEX) 10-8 MG/5ML suspension 5 mL (has no administration in time range)  albuterol (VENTOLIN HFA) 108 (90 Base) MCG/ACT inhaler 2 puff (2 puffs Inhalation Given 10/26/19 1651)  insulin aspart (novoLOG) injection 0-15 Units (has no administration in time range)  insulin aspart (novoLOG) injection 0-5 Units (has no administration in time range)  amLODipine (NORVASC) tablet 5 mg (5 mg Oral Given 10/26/19 1656)  gabapentin (NEURONTIN) capsule 300 mg (300 mg Oral Given 10/26/19 1654)   insulin glargine (LANTUS) injection 60 Units (60 Units Subcutaneous Given 10/26/19 1659)  pantoprazole (PROTONIX) EC tablet 40 mg (40 mg Oral Given 10/26/19 1655)  acetaminophen (TYLENOL) tablet 650 mg (650 mg Oral Given 10/26/19 1145)  iohexol (OMNIPAQUE) 300 MG/ML solution 75 mL (75 mLs Intravenous Contrast Given 10/26/19 1528)    ED Course  I have reviewed the triage vital signs and the nursing notes.  Pertinent labs & imaging results that were available during my care of the patient were reviewed by me and considered in my medical decision making (see chart for details).  Clinical Course as of Oct 25 1744  Fri Oct 26, 2019  1149 Patient's EKG is normal sinus right bundle branch block, no significant change from 2 days ago.   [MB]  1331 Discussed with [facility] hospitalist Dr. Wyline Copas who will evaluate the patient for admission.   [MB]    Clinical Course User Index [MB] Hayden Rasmussen, MD   MDM Rules/Calculators/A&P                         John [name] was evaluated in Emergency Department on 10/26/2019 for the symptoms described in the history of present illness. He was evaluated in the context of the global COVID-19 pandemic, which necessitated consideration that the patient might be at risk for infection with the SARS-CoV-2 virus that causes COVID-19. Institutional protocols and algorithms that pertain to the evaluation of patients at risk for COVID-19 are in a state of rapid change based on information released by regulatory bodies including the CDC and federal and state organizations. These policies and algorithms were followed during the patient's care in the ED.  This patient complains of cough fever fatigue confusion Covid positive; this involves an extensive number of treatment Options and is a complaint that carries with it a high risk of complications and Morbidity. The differential includes Covid, Covid pneumonia, hypoxia, sepsis, Sirs, metabolic derangement, PE  I ordered, reviewed  and interpreted labs, which included CBC with normal white count normal hemoglobin, chemistries with elevated glucose unclear significance in setting of getting steroids, LFTs elevated question due to  liver or gallbladder primarily versus due to Covid. I ordered medication acetaminophen for fever I ordered imaging studies which included chest x-ray and I independently    visualized and interpreted imaging which showed no gross infiltrates Previous records obtained and reviewed in epic including prior ED visit 2 days ago in which patient was offered admission for further management of his symptoms.  It sounds like at that time his sats were anywhere between 88 and 95%. I consulted [facility] hospitalist Dr. Wyline Copas and discussed lab and imaging findings  Critical Interventions: Work-up and management of patient's worsening Covid symptoms, confusion After the interventions stated above, I reevaluated the patient and found patient to be symptomatically improved.  Will need admission for continued management.   Final Clinical Impression(s) / ED Diagnoses Final diagnoses:  COVID-19 virus infection  Weakness  Acute respiratory failure with hypoxemia [facility])    Rx / DC Orders ED Discharge Orders    None       Hayden Rasmussen, MD 10/26/19 1752

## 2019-10-27 ENCOUNTER — Ambulatory Visit (HOSPITAL_COMMUNITY): Payer: Medicare Other

## 2019-10-27 DIAGNOSIS — J1282 Pneumonia due to Coronavirus disease 2019: Secondary | ICD-10-CM | POA: Diagnosis not present

## 2019-10-27 DIAGNOSIS — U071 COVID-19: Secondary | ICD-10-CM | POA: Diagnosis not present

## 2019-10-27 LAB — HEMOGLOBIN A1C
Hgb A1c MFr Bld: 10.7 % — ABNORMAL HIGH (ref 4.8–5.6)
Mean Plasma Glucose: 260.39 mg/dL

## 2019-10-27 LAB — GLUCOSE, CAPILLARY
Glucose-Capillary: 217 mg/dL — ABNORMAL HIGH (ref 70–99)
Glucose-Capillary: 271 mg/dL — ABNORMAL HIGH (ref 70–99)
Glucose-Capillary: 329 mg/dL — ABNORMAL HIGH (ref 70–99)
Glucose-Capillary: 239 mg/dL — ABNORMAL HIGH (ref 70–99)

## 2019-10-27 LAB — COMPREHENSIVE METABOLIC PANEL WITH GFR
ALT: 43 U/L (ref 0–44)
AST: 40 U/L (ref 15–41)
Albumin: 3.3 g/dL — ABNORMAL LOW (ref 3.5–5.0)
Alkaline Phosphatase: 35 U/L — ABNORMAL LOW (ref 38–126)
Anion gap: 12 (ref 5–15)
BUN: 17 mg/dL (ref 8–23)
CO2: 24 mmol/L (ref 22–32)
Calcium: 8.6 mg/dL — ABNORMAL LOW (ref 8.9–10.3)
Chloride: 99 mmol/L (ref 98–111)
Creatinine, Ser: 0.73 mg/dL (ref 0.61–1.24)
GFR calc Af Amer: >60 mL/min
GFR calc non Af Amer: >60 mL/min
Glucose, Bld: 248 mg/dL — ABNORMAL HIGH (ref 70–99)
Potassium: 3.7 mmol/L (ref 3.5–5.1)
Sodium: 135 mmol/L (ref 135–145)
Total Bilirubin: 0.9 mg/dL (ref 0.3–1.2)
Total Protein: 6 g/dL — ABNORMAL LOW (ref 6.5–8.1)

## 2019-10-27 LAB — CBC
HCT: 40.5 % (ref 39.0–52.0)
Hemoglobin: 13.6 g/dL (ref 13.0–17.0)
MCH: 30.6 pg (ref 26.0–34.0)
MCHC: 33.6 g/dL (ref 30.0–36.0)
MCV: 91.2 fL (ref 80.0–100.0)
Platelets: 141 10*3/uL — ABNORMAL LOW (ref 150–400)
RBC: 4.44 MIL/uL (ref 4.22–5.81)
RDW: 13.7 % (ref 11.5–15.5)
WBC: 4 10*3/uL (ref 4.0–10.5)
nRBC: 0 % (ref 0.0–0.2)

## 2019-10-27 LAB — C-REACTIVE PROTEIN: CRP: 4.1 mg/dL — ABNORMAL HIGH

## 2019-10-27 LAB — D-DIMER, QUANTITATIVE: D-Dimer, Quant: 0.55 ug{FEU}/mL — ABNORMAL HIGH (ref 0.00–0.50)

## 2019-10-27 LAB — ABO/RH: ABO/RH(D): A POS

## 2019-10-27 LAB — FERRITIN: Ferritin: 457 ng/mL — ABNORMAL HIGH (ref 24–336)

## 2019-10-27 MED ORDER — ADULT MULTIVITAMIN W/MINERALS CH
1.0000 | ORAL_TABLET | Freq: Every day | ORAL | Status: DC
  Administered 2019-10-27 – 2019-10-28 (×2): 1 via ORAL
  Filled 2019-10-27 (×2): qty 1

## 2019-10-27 MED ORDER — METFORMIN HCL ER 500 MG PO TB24
500.0000 mg | ORAL_TABLET | Freq: Every day | ORAL | Status: DC
  Administered 2019-10-27: 500 mg via ORAL
  Filled 2019-10-27 (×2): qty 1

## 2019-10-27 MED ORDER — DEXTROMETHORPHAN POLISTIREX ER 30 MG/5ML PO SUER
15.0000 mg | Freq: Two times a day (BID) | ORAL | Status: DC
  Administered 2019-10-27 – 2019-10-28 (×3): 15 mg via ORAL
  Filled 2019-10-27 (×3): qty 5

## 2019-10-27 MED ORDER — FLUOXETINE HCL 20 MG PO CAPS
20.0000 mg | ORAL_CAPSULE | Freq: Every day | ORAL | Status: DC
  Administered 2019-10-27 – 2019-10-28 (×2): 20 mg via ORAL
  Filled 2019-10-27 (×2): qty 1

## 2019-10-27 MED ORDER — HYDROCODONE-HOMATROPINE 5-1.5 MG/5ML PO SYRP
5.0000 mL | ORAL_SOLUTION | ORAL | Status: AC
  Administered 2019-10-27 (×3): 5 mL via ORAL
  Filled 2019-10-27 (×3): qty 5

## 2019-10-27 MED ORDER — ASCORBIC ACID 500 MG PO TABS: 1000.0000 mg | ORAL_TABLET | Freq: Every day | ORAL | Status: DC

## 2019-10-27 MED ORDER — ENSURE ENLIVE PO LIQD: 237.0000 mL | Freq: Three times a day (TID) | ORAL | Status: DC

## 2019-10-27 MED ORDER — ACETAMINOPHEN 500 MG PO TABS
500.0000 mg | ORAL_TABLET | Freq: Four times a day (QID) | ORAL | Status: DC | PRN
  Administered 2019-10-27: 500 mg via ORAL
  Filled 2019-10-27: qty 1

## 2019-10-27 NOTE — Progress Notes (Signed)
PROGRESS NOTE    John [name]  PZW:[id number] DOB: [birth year] DOA: 10/26/2019 PCP: Shelda Pal, DO  Brief Narrative:  64 retired Emergency planning/management officer community dwelling [place] hypertensive Diabetic with diabetic neuropathy Prostate cancer on tamoxifen Reflux Status post Covid 19 immunizations 04/2019 Recently visited currently deceased sister-in-law [place]-both him and wife contracted COVID-19 On admission hypoxic 85% T-max 101.3 dimer 0.55 ferritin 457 CRP 4.1 Also had metabolic encephalopathy-told EMS that it was "dinnertime" when it was actually only 10 AM    Assessment & Plan:   Principal Problem:   Pneumonia due to COVID-19 virus Active Problems:   Essential hypertension   History of breast cancer   GERD (gastroesophageal reflux disease)   BPH (benign prostatic hyperplasia)   1. COVID-19 with encephalopathy probably secondary to high fever a. CRP slightly up, however oxygenation status is better he is requiring less oxygen than prior b. Covid pathway = remdesivir/steroids/oxygen/prone 16 hours c. Watch carefully immune O markers and consider Actemra if worsens although unlikely 2. DM TY 2 with neuropathy nephropathy a. Resumed home Metformin 500 XR in addition to his currently scheduled 60 units of Lantus b. May be able to continue sliding scale but if sugars are below 180 would discontinue aggressive checks 3. Prostate cancer on tamoxifen a. Tamoxifen on hold-outpatient follow-up 4. Reflux a. Continue Prilosec 5. Depression a. Continue Prozac 20 which was reordered  DVT prophylaxis: Lovenox prophylactic Code Status: Full Family Communication: called spouse [phone]  Disposition:   Status is: Inpatient  Remains inpatient appropriate because:IV treatments appropriate due to intensity of illness or inability to take PO and Inpatient level of care appropriate due to severity of illness   Dispo: The patient is from: Home              Anticipated d/c is  to: Home              Anticipated d/c date is: 1 day              Patient currently is not medically stable to d/c.   Consultants:   No   Procedures: n  Antimicrobials:    Subjective: Awake coherent no distress No cough no n/v/d  Objective: Vitals:   10/26/19 2200 10/27/19 0214 10/27/19 0542 10/27/19 0649  BP: (!) 154/79 136/60 (!) 148/67 (!) 156/71  Pulse: 68 60 64 64  Resp: 19 18 20 19   Temp: 98.3 F (36.8 C) 98.1 F (36.7 C) 98.3 F (36.8 C) 100.2 F (37.9 C)  TempSrc: Oral Oral Oral Oral  SpO2: 97% 98% 92% 98%  Weight:      Height:       No intake or output data in the 24 hours ending 10/27/19 0829 Filed Weights   10/26/19 1116  Weight: 108.9 kg    Examination:  General exam: eomi ncat, mallampatti 2 Respiratory system: cta b no rales no rhonchi Cardiovascular system: s1 s2 no m Gastrointestinal system: soft nt nd . Central nervous system: intact Extremities:  soft Skin: no rash   Data Reviewed: I have personally reviewed following labs and imaging studies COVID-19 Labs  Recent Labs    10/26/19 1203 10/27/19 0445  DDIMER 0.58* 0.55*  FERRITIN 379* 457*  LDH 147  --   CRP 3.9* 4.1*    Lab Results  Component Value Date   SARSCOV2NAA POSITIVE (A) 10/24/2019     Radiology Studies: CT CHEST W CONTRAST  Result Date: 10/26/2019 CLINICAL DATA:  Pneumonia, pleural effusion, COVID-19 positive EXAM: CT  CHEST WITH CONTRAST TECHNIQUE: Multidetector CT imaging of the chest was performed during intravenous contrast administration. CONTRAST:  95mL OMNIPAQUE IOHEXOL 300 MG/ML  SOLN COMPARISON:  None. FINDINGS: Cardiovascular: Extensive multi-vessel coronary artery calcification. Mild global cardiomegaly. There is relative enlargement of the right ventricle as well as enlargement of the central pulmonary arteries in keeping with changes of pulmonary arterial hypertension and elevated right heart pressure. The thoracic aorta is age-appropriate.  Mediastinum/Nodes: No pathologic thoracic adenopathy. Lungs/Pleura: Sparse scattered ground-glass pulmonary infiltrates are present predominantly within the upper lobes bilaterally trace bilateral pleural effusions are present. There is bibasilar atelectasis present. No pneumothorax. The central airways are widely patent. Upper Abdomen: Marked Paddock steatosis. Cholelithiasis noted. The gallbladder is distended, however, no pericholecystic inflammatory changes are noted. Musculoskeletal: No acute bone abnormality IMPRESSION: Biapical ground-glass pulmonary infiltrates most in keeping with atypical infection in the acute setting. The findings are not pathognomonic of COVID-19 pneumonia and alternative infection including viral and atypical organism should also be considered. Extensive coronary artery calcification. Morphologic changes in keeping with elevated right heart and pulmonary arterial pressure. Severe hepatic steatosis. Electronically Signed   By: Fidela Salisbury MD   On: 10/26/2019 15:55   DG Chest Port 1 View  Result Date: 10/26/2019 CLINICAL DATA:  COVID-19 positive, cough. EXAM: PORTABLE CHEST 1 VIEW COMPARISON:  October 24, 2019. FINDINGS: The heart size and mediastinal contours are within normal limits. Both lungs are clear. The visualized skeletal structures are unremarkable. IMPRESSION: No active disease. Electronically Signed   By: Marijo Conception M.D.   On: 10/26/2019 13:56     Scheduled Meds: . albuterol  2 puff Inhalation Q6H  . amLODipine  5 mg Oral Daily  . vitamin C  500 mg Oral Daily  . dexamethasone (DECADRON) injection  6 mg Intravenous Q24H  . enoxaparin (LOVENOX) injection  40 mg Subcutaneous Q24H  . gabapentin  300 mg Oral 6 X Daily  . insulin aspart  0-15 Units Subcutaneous TID WC  . insulin aspart  0-5 Units Subcutaneous QHS  . insulin glargine  60 Units Subcutaneous Daily  . pantoprazole  40 mg Oral Daily  . zinc sulfate  220 mg Oral Daily   Continuous Infusions: .  remdesivir 100 mg in NS 100 mL       LOS: 1 day    Time spent: Carter, MD [facility] To contact the attending provider between 7A-7P or the covering provider during after hours 7P-7A, please log into the web site [email or web address] and access using universal  password for that web site. If you do not have the password, please call the hospital operator.  10/27/2019, 8:29 AM

## 2019-10-27 NOTE — Plan of Care (Signed)

## 2019-10-27 NOTE — Progress Notes (Signed)
Initial Nutrition Assessment  DOCUMENTATION CODES:   Obesity unspecified  INTERVENTION:  Ensure Enlive po TID, each supplement provides 350 kcal and 20 grams of protein Double protein portion with breakfast and dinner meals MVI with minerals daily   NUTRITION DIAGNOSIS:   Increased nutrient needs related to catabolic illness (pneumonia due to COVID-19  virus) as evidenced by estimated needs.    GOAL:   Patient will meet greater than or equal to 90% of their needs    MONITOR:   PO intake, Weight trends, Supplement acceptance, I & O's, Labs  REASON FOR ASSESSMENT:   Malnutrition Screening Tool    ASSESSMENT:  RD working remotely.  74 year old male with past medical history of BPH, HTN, GERD, HLD, DM2 with neuropathy, depression and recently diagnosed with COVID-19 virus on 7/14 s/p receiving both Pfeizer vaccines earlier this year presented from home with increased confusion. Patient admitted for pneumonia due to COVID-19 virus.  Patient is eating well, per flowsheets he consumed 100% of breakfast this morning.  Patient with increased needs secondary to catabolic illness and would greatly benefit from nutrient dense supplement. Will order Ensure TID as well as double protein portions with breakfast and dinner meals.   Per Care everywhere weights stable 229-239 lb over the past 7 months.  Medications reviewed and include: Vit C, Decadron, Delsym, Gabapentin, Hycodan, SSI, Lantus 60 units daily, Metformin, Zinc sulfate IVPB: Remdesivir Labs: [phone] No results found for: HGBA1C   NUTRITION - FOCUSED PHYSICAL EXAM: Unable to complete at this time, RD working remotely.  Diet Order:   Diet Order            Diet Carb Modified Fluid consistency: Thin; Room service appropriate? Yes  Diet effective now                 EDUCATION NEEDS:   No education needs have been identified at this time  Skin:  Skin Assessment: Reviewed RN Assessment  Last BM:   7/14  Height:   Ht Readings from Last 1 Encounters:  10/26/19 6' (1.829 m)    Weight:   Wt Readings from Last 1 Encounters:  10/26/19 108.9 kg    Ideal Body Weight:  80.9 kg  BMI:  Body mass index is 32.55 kg/m.  Estimated Nutritional Needs:   Kcal:  9179-1505  Protein:  130-145  Fluid:  >/= 2.4 L   Lajuan Lines, RD, LDN Clinical Nutrition After Hours/Weekend Pager # in [place]

## 2019-10-28 DIAGNOSIS — U071 COVID-19: Secondary | ICD-10-CM | POA: Diagnosis not present

## 2019-10-28 DIAGNOSIS — J1282 Pneumonia due to Coronavirus disease 2019: Secondary | ICD-10-CM | POA: Diagnosis not present

## 2019-10-28 LAB — GLUCOSE, CAPILLARY
Glucose-Capillary: 188 mg/dL — ABNORMAL HIGH (ref 70–99)
Glucose-Capillary: 220 mg/dL — ABNORMAL HIGH (ref 70–99)
Glucose-Capillary: 222 mg/dL — ABNORMAL HIGH (ref 70–99)
Glucose-Capillary: 258 mg/dL — ABNORMAL HIGH (ref 70–99)

## 2019-10-28 LAB — FERRITIN: Ferritin: 500 ng/mL — ABNORMAL HIGH (ref 24–336)

## 2019-10-28 LAB — D-DIMER, QUANTITATIVE: D-Dimer, Quant: 0.51 ug/mL-FEU — ABNORMAL HIGH (ref 0.00–0.50)

## 2019-10-28 LAB — C-REACTIVE PROTEIN: CRP: 2 mg/dL — ABNORMAL HIGH (ref <1.0)

## 2019-10-28 MED ORDER — ZINC SULFATE 220 (50 ZN) MG PO CAPS: 220.0000 mg | ORAL_CAPSULE | Freq: Every day | ORAL | 1 refills | Status: DC

## 2019-10-28 MED ORDER — ALBUTEROL SULFATE HFA 108 (90 BASE) MCG/ACT IN AERS: 2.0000 | INHALATION_SPRAY | Freq: Four times a day (QID) | RESPIRATORY_TRACT | 0 refills | Status: DC

## 2019-10-28 MED ORDER — METFORMIN HCL ER 750 MG PO TB24: 750.0000 mg | ORAL_TABLET | Freq: Every day | ORAL | 0 refills | Status: DC

## 2019-10-28 MED ORDER — DEXAMETHASONE 6 MG PO TABS: 6.0000 mg | ORAL_TABLET | Freq: Every day | ORAL | 0 refills | Status: DC

## 2019-10-28 MED ORDER — DEXAMETHASONE 6 MG PO TABS: 6.0000 mg | ORAL_TABLET | Freq: Every day | ORAL | 0 refills | Status: AC

## 2019-10-28 MED ORDER — INSULIN GLARGINE 100 UNIT/ML ~~LOC~~ SOLN: 65.0000 [IU] | Freq: Every day | SUBCUTANEOUS | 11 refills | Status: DC

## 2019-10-28 MED ORDER — METFORMIN HCL ER 750 MG PO TB24
750.0000 mg | ORAL_TABLET | Freq: Every day | ORAL | Status: DC
  Administered 2019-10-28: 750 mg via ORAL
  Filled 2019-10-28: qty 1

## 2019-10-28 MED ORDER — GUAIFENESIN 100 MG/5ML PO SOLN
5.0000 mL | ORAL | Status: DC | PRN
  Administered 2019-10-28: 100 mg via ORAL
  Filled 2019-10-28: qty 10

## 2019-10-28 MED ORDER — GUAIFENESIN 100 MG/5ML PO SOLN: 5.0000 mL | ORAL | 0 refills | Status: DC | PRN

## 2019-10-28 MED ORDER — DEXTROMETHORPHAN POLISTIREX ER 30 MG/5ML PO SUER: 15.0000 mg | Freq: Two times a day (BID) | ORAL | 0 refills | Status: DC

## 2019-10-28 MED ORDER — INSULIN GLARGINE 100 UNIT/ML ~~LOC~~ SOLN
65.0000 [IU] | Freq: Every day | SUBCUTANEOUS | Status: DC
  Administered 2019-10-28: 65 [IU] via SUBCUTANEOUS
  Filled 2019-10-28: qty 0.65

## 2019-10-28 NOTE — Discharge Instructions (Signed)
Patient scheduled for outpatient Remdesivir infusion at 10am on Monday 7/19 and Tuesday 7/20 at [place], [place].   Please advise the patient to stay in their car and report to the Covid Infusion Parking Spots at the back left of the lot. There is a wave flag banner located near the entrance on [address]. Turn into this entrance, turn left and park in 1 of the 5 designated Covid Infusion Parking spots. There is a phone number on the sign, please call and let the staff know what spot you are in and we will come out and get you.  For questions call [phone].  Thanks

## 2019-10-28 NOTE — Plan of Care (Signed)
Discharge instructions reviewed with patient, questions answered, verbalized understanding.  Patient ambulatory to  main entrance to be taken home by spouse.  Patient understands he is to return to the infusion center on 7/19 and 7/20 to complete remdesivir.

## 2019-10-28 NOTE — Progress Notes (Signed)
Patient scheduled for outpatient Remdesivir infusion at 10am on Monday 7/19 and Tuesday 7/20 at [place], [place].   Please advise the patient to stay in their car and report to the Covid Infusion Parking Spots at the back left of the lot. There is a wave flag banner located near the entrance on [address]. Turn into this entrance, turn left and park in 1 of the 5 designated Covid Infusion Parking spots. There is a phone number on the sign, please call and let the staff know what spot you are in and we will come out and get you.  For questions call [phone].  Thanks

## 2019-10-28 NOTE — Discharge Summary (Signed)
Physician Discharge Summary  Rita Prom XTK:[id number] DOB: [birth year] DOA: 10/26/2019  PCP: Shelda Pal, DO  Admit date: 10/26/2019 Discharge date: 10/28/2019  Time spent: 45 minutes  Recommendations for Outpatient Follow-up:  1. Usual post Covid precautions for 21 days given 2. Decadron 10 days total prescribed with prescription given in addition to antitussives called into pharmacy 3. Noticed dosage changes/increase of diabetic regimen and change to Metformin dosing 4. Requires Chem-12 CBC 1 week 5. 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 COVID-19 with encephalopathy probably secondary to high fever a. CRP slightly up, however oxygenation status is better he is requiring less oxygen than prior b. Covid pathway = remdesivir/steroids/oxygen/prone 16 hours c. Immune markers dropped significantly during hospital stay-patient did not have any oxygen requirement at time of discharge and he was set up for doses 4 and 5 in the outpatient setting for remdesivir 2. DM TY 2 with neuropathy nephropathy a. Patient  transitioned to XL Metformin 750 b. Increase Lantus to 65 units given on steroids 3. Prostate cancer on tamoxifen a. Tamoxifen on hold-outpatient follow-up 4. Reflux a. Continue Prilosec 5. Depression a. Continue Prozac 20 which was reordered  Discharge Exam: Vitals:   10/27/19 2044 10/28/19 0603  BP: 137/77 (!) 153/72  Pulse: 70 61  Resp: 18 18  Temp: 98.3 F (36.8 C) 98.4 F (36.9 C)  SpO2: 94% 96%   Awake alert coherent no distress Tolerated a shower ambulated by himself General: Awake alert coherent no distress mild cough Cardiovascular: S1-S2 no murmur rub or gallop Respiratory: Clinically no wheeze no rales no rhonchi Abdomen soft nontender no rebound no guarding Neurologically intact  Discharge Instructions   Discharge Instructions    Diet - low sodium heart healthy   Complete by: As directed    Discharge instructions   Complete by: As directed    Go slowly for the first several days after ur infection as you will need to be able to tolerate activity at a graduated fashion We have called in several cough syrups as discussed with you you can try Echinacea tea and honey which does have a salutary effect on cough I would recommend in addition to use a slightly higher dose of your Lantus and noticed we have changed your 3 times a day Metformin to 750 XL once daily-you can discuss with your primary care physician if your sugars run above 250 consistently by your checks at home Best of luck at home and hopefully things will tide over take care of yourself and have a good rest of the summer   Increase activity slowly   Complete by: As directed    Temperature monitoring   Complete by:  Oct 28, 2019    After how many days would you like to receive a notification of this patient's flowsheet entries?: 1   MyChart COVID-19 home monitoring program   Complete by: Oct 29, 2019    Is the patient willing to use the [place] for home monitoring?: Yes     Allergies as  of 10/28/2019      Reactions   Ramipril Anaphylaxis   Atorvastatin    Other reaction(s): Other (See Comments) Calf cramps   Other Diarrhea   Severe intolerance to Chemotherapy in the past.   Adhesive [tape] Rash      Medication List    TAKE these medications   acetaminophen 650 MG CR tablet Commonly known as: TYLENOL Take 1,300 mg by mouth every 8 (eight) hours as needed for pain.   albuterol 108 (90 Base) MCG/ACT inhaler Commonly known as: VENTOLIN HFA Inhale 2 puffs into the lungs every 6 (six) hours.   amLODipine 5 MG tablet Commonly known as: NORVASC Take 5 mg by mouth daily.   aspirin 325 MG EC tablet Take 325 mg by mouth daily.   dexamethasone 6 MG tablet Commonly known as: DECADRON Take 1 tablet (6 mg total) by mouth daily for 6 days.   dextromethorphan 30 MG/5ML liquid Commonly known as: DELSYM Take 2.5 mLs (15 mg total) by mouth 2 (two) times daily.   FLUoxetine 20 MG capsule Commonly known as: PROZAC Take 20 mg by mouth daily.   gabapentin 300 MG capsule Commonly known as: NEURONTIN Take 300 mg by mouth 6 (six) times daily.   guaiFENesin 100 MG/5ML Soln Commonly known as: ROBITUSSIN Take 5 mLs (100 mg total) by mouth every 4 (four) hours as needed for cough or to loosen phlegm.   insulin glargine 100 UNIT/ML injection Commonly known as: Lantus Inject 0.65 mLs (65 Units total) into the skin daily. Start taking on: October 29, 2019 What changed: how much to take   metFORMIN 750 MG 24 hr tablet Commonly known as: GLUCOPHAGE-XR Take 1 tablet (750 mg total) by mouth daily with breakfast. Start taking on: October 29, 2019 What changed:   medication strength  how much to take  when to take this   multivitamin tablet Take 1 tablet by mouth daily.   omeprazole 40 MG capsule Commonly known as: PRILOSEC Take 40 mg by mouth daily.   ondansetron 4 MG tablet Commonly known as: ZOFRAN Take 1 tablet (4 mg total) by mouth every 8 (eight) hours as needed  for nausea or vomiting.   tamoxifen 20 MG tablet Commonly known as: NOLVADEX Take 20 mg by mouth daily.   vitamin C 1000 MG tablet Take 1,000 mg by mouth in the morning and at bedtime.   zinc sulfate 220 (50 Zn) MG capsule Take 1 capsule (220 mg total) by mouth daily. Start taking on: October 29, 2019      Allergies  Allergen Reactions  . Ramipril Anaphylaxis  . Atorvastatin     Other reaction(s): Other (See Comments) Calf cramps  . Other Diarrhea    Severe intolerance to Chemotherapy in the past.  . Adhesive [Tape] Rash      The results of significant diagnostics from this hospitalization (including imaging, microbiology, ancillary and laboratory) are listed below for reference.    Significant Diagnostic Studies: CT CHEST W CONTRAST  Result Date: 10/26/2019 CLINICAL DATA:  Pneumonia, pleural effusion, COVID-19 positive EXAM: CT CHEST WITH CONTRAST TECHNIQUE: Multidetector CT imaging of the chest was performed during  intravenous contrast administration. CONTRAST:  2mL OMNIPAQUE IOHEXOL 300 MG/ML  SOLN COMPARISON:  None. FINDINGS: Cardiovascular: Extensive multi-vessel coronary artery calcification. Mild global cardiomegaly. There is relative enlargement of the right ventricle as well as enlargement of the central pulmonary arteries in keeping with changes of pulmonary arterial hypertension and elevated right heart pressure. The thoracic aorta is age-appropriate. Mediastinum/Nodes: No pathologic thoracic adenopathy. Lungs/Pleura: Sparse scattered ground-glass pulmonary infiltrates are present predominantly within the upper lobes bilaterally trace bilateral pleural effusions are present. There is bibasilar atelectasis present. No pneumothorax. The central airways are widely patent. Upper Abdomen: Marked [place] steatosis. Cholelithiasis noted. The gallbladder is distended, however, no pericholecystic inflammatory changes are noted. Musculoskeletal: No acute bone abnormality IMPRESSION:  Biapical ground-glass pulmonary infiltrates most in keeping with atypical infection in the acute setting. The findings are not pathognomonic of COVID-19 pneumonia and alternative infection including viral and atypical organism should also be considered. Extensive coronary artery calcification. Morphologic changes in keeping with elevated right heart and pulmonary arterial pressure. Severe hepatic steatosis. Electronically Signed   By: Fidela Salisbury MD   On: 10/26/2019 15:55   DG Chest Port 1 View  Result Date: 10/26/2019 CLINICAL DATA:  COVID-19 positive, cough. EXAM: PORTABLE CHEST 1 VIEW COMPARISON:  October 24, 2019. FINDINGS: The heart size and mediastinal contours are within normal limits. Both lungs are clear. The visualized skeletal structures are unremarkable. IMPRESSION: No active disease. Electronically Signed   By: Marijo Conception M.D.   On: 10/26/2019 13:56   DG Chest Portable 1 View  Result Date: 10/24/2019 CLINICAL DATA:  Fever EXAM: PORTABLE CHEST 1 VIEW COMPARISON:  03/25/2018 FINDINGS: The heart size and mediastinal contours are within normal limits. Both lungs are clear. The visualized skeletal structures are unremarkable. IMPRESSION: No acute abnormality of the lungs in AP portable projection. Electronically Signed   By: Eddie Candle M.D.   On: 10/24/2019 09:45   US Abdomen Limited RUQ  Result Date: 10/24/2019 CLINICAL DATA:  74 year old male with history of recurrent emesis. COVID positive patient. EXAM: ULTRASOUND ABDOMEN LIMITED RIGHT UPPER QUADRANT COMPARISON:  No priors. FINDINGS: Gallbladder: No gallstones or wall thickening visualized. No sonographic Murphy sign noted by sonographer. Common bile duct: Diameter: 3.2 mm Liver: No focal lesion identified. Diffusely increased parenchymal echogenicity. Portal vein is patent on color Doppler imaging with normal direction of blood flow towards the liver. Other: None. IMPRESSION: 1. No acute findings. Specifically, no cholelithiasis or  evidence to suggest an acute cholecystitis. 2. Hepatic steatosis. Electronically Signed   By: Vinnie Langton M.D.   On: 10/24/2019 12:53    Microbiology: Recent Results (from the past 240 hour(s))  SARS Coronavirus 2 by RT PCR (hospital order, performed in [facility] hospital lab) Nasopharyngeal Nasopharyngeal Swab     Status: Abnormal   Collection Time: 10/24/19 10:04 AM   Specimen: Nasopharyngeal Swab  Result Value Ref Range Status   SARS Coronavirus 2 POSITIVE (A) NEGATIVE Final    Comment: RESULT CALLED TO, READ BACK BY AND VERIFIED WITH: CALLED TO R.MARCUM AT [place] ON [id number] (NOTE) SARS-CoV-2 target nucleic acids are DETECTED  SARS-CoV-2 RNA is generally detectable in upper respiratory specimens  during the acute phase of infection.  Positive results are indicative  of the presence of the identified virus, but do not rule out bacterial infection or co-infection with other pathogens not detected by the test.  Clinical correlation with patient history and  other diagnostic information is necessary to determine patient infection status.  The  expected result is negative.  Fact Sheet for Patients:   StrictlyIdeas.no   Fact Sheet for Healthcare Providers:   BankingDealers.co.za    This test is not yet approved or cleared by the [place] FDA and  has been authorized for detection and/or diagnosis of SARS-CoV-2 by FDA under an Emergency Use Authorization (EUA).  This EUA will remain in effect (meani ng this test can be used) for the duration of  the COVID-19 declaration under Section 564(b)(1) of the Act, 21 U.S.C. section 360-bbb-3(b)(1), unless the authorization is terminated or revoked sooner.  Performed at [facility], [address] [place], [place] [address]   Urine culture     Status: Abnormal   Collection Time: 10/24/19 11:00 AM   Specimen: Urine, Clean Catch  Result Value Ref Range Status    Specimen Description   Final    URINE, CLEAN CATCH Performed at [facility], [address] [place], [place] [address]    Special Requests   Final    NONE Performed at [facility], [address] [place], [place] [address]    Culture 30,000 COLONIES/mL ESCHERICHIA COLI (A)  Final   Report Status 10/26/2019 FINAL  Final   Organism ID, Bacteria ESCHERICHIA COLI (A)  Final      Susceptibility   Escherichia coli - MIC*    AMPICILLIN <=2 SENSITIVE Sensitive     CEFAZOLIN <=4 SENSITIVE Sensitive     CEFTRIAXONE <=0.25 SENSITIVE Sensitive     CIPROFLOXACIN <=0.25 SENSITIVE Sensitive     GENTAMICIN <=1 SENSITIVE Sensitive     IMIPENEM <=0.25 SENSITIVE Sensitive     NITROFURANTOIN <=16 SENSITIVE Sensitive     TRIMETH/SULFA <=20 SENSITIVE Sensitive     AMPICILLIN/SULBACTAM <=2 SENSITIVE Sensitive     PIP/TAZO <=4 SENSITIVE Sensitive     * 30,000 COLONIES/mL ESCHERICHIA COLI  Blood Culture (routine x 2)     Status: None (Preliminary result)   Collection Time: 10/26/19 12:03 PM   Specimen: BLOOD  Result Value Ref Range Status   Specimen Description   Final    BLOOD RIGHT WRIST Performed at [facility] [address] [place], [place] [address]    Special Requests   Final    BOTTLES DRAWN AEROBIC AND ANAEROBIC Blood Culture results may not be optimal due to an inadequate volume of blood received in culture bottles Performed at [facility] [address] [place], [place] [address]    Culture   Final    NO GROWTH < 24 HOURS Performed at [facility] [address] [place], [place] [address]    Report Status PENDING  Incomplete  Blood Culture (routine x 2)     Status: None (Preliminary result)   Collection Time: 10/26/19 12:07 PM   Specimen: BLOOD  Result Value Ref Range Status   Specimen Description   Final    BLOOD BLOOD RIGHT FOREARM Performed at [facility] [address]  [place], [place] [address]    Special Requests   Final    BOTTLES DRAWN AEROBIC AND ANAEROBIC Blood Culture adequate volume Performed at [facility] [address], [place], [place] [address]    Culture   Final    NO GROWTH < 24 HOURS Performed at [facility] [address], [place], [place] [address]    Report Status PENDING  Incomplete     Labs: Basic Metabolic Panel:  Recent Labs  Lab 10/24/19 0844 10/26/19 1203 10/27/19 0445  NA 133* 136 135  K 3.8 3.8 3.7  CL 96* 100 99  CO2 24 26 24   GLUCOSE 285* 202* 248*  BUN 14 16 17   CREATININE 1.04 0.86 0.73  CALCIUM 8.7* 8.3* 8.6*   Liver Function Tests: Recent Labs  Lab 10/24/19 0844 10/26/19 1203 10/27/19 0445  AST 48* 48* 40  ALT 41 46* 43  ALKPHOS 37* 35* 35*  BILITOT 1.0 0.8 0.9  PROT 6.4* 6.0* 6.0*  ALBUMIN 3.6 3.3* 3.3*   Recent Labs  Lab 10/24/19 0844  LIPASE 22   No results for input(s): AMMONIA in the last 168 hours. CBC: Recent Labs  Lab 10/24/19 0844 10/26/19 1203 10/27/19 0445  WBC 6.4 4.3 4.0  NEUTROABS  --  3.3  --   HGB 14.0 13.7 13.6  HCT 40.5 41.0 40.5  MCV 90.6 92.8 91.2  PLT 181 150 141*   Cardiac Enzymes: No results for input(s): CKTOTAL, CKMB, CKMBINDEX, TROPONINI in the last 168 hours. BNP: BNP (last 3 results) No results for input(s): BNP in the last 8760 hours.  ProBNP (last 3 results) No results for input(s): PROBNP in the last 8760 hours.  CBG: Recent Labs  Lab 10/27/19 1617 10/27/19 2037 10/28/19 0018 10/28/19 0430 10/28/19 0808  GLUCAP 329* 239* 258* 222* 188*       Signed:  Nita Sells MD   [facility] 10/28/2019, 10:47 AM

## 2019-10-29 ENCOUNTER — Telehealth: Payer: Self-pay | Admitting: *Deleted

## 2019-10-29 ENCOUNTER — Ambulatory Visit (HOSPITAL_COMMUNITY)
Admission: RE | Admit: 2019-10-29 | Discharge: 2019-10-29 | Disposition: A | Discharge status: Home / Self Care | Payer: Medicare Other | Source: Ambulatory Visit | Attending: Pulmonary Disease | Admitting: Pulmonary Disease

## 2019-10-29 ENCOUNTER — Encounter: Payer: Self-pay | Admitting: *Deleted

## 2019-10-29 DIAGNOSIS — U071 COVID-19: Secondary | ICD-10-CM | POA: Diagnosis present

## 2019-10-29 DIAGNOSIS — J1289 Other viral pneumonia: Secondary | ICD-10-CM | POA: Diagnosis not present

## 2019-10-29 MED ORDER — METHYLPREDNISOLONE SODIUM SUCC 125 MG IJ SOLR: 125.0000 mg | Freq: Once | INTRAMUSCULAR | Status: DC | PRN

## 2019-10-29 MED ORDER — FAMOTIDINE IN NACL 20-0.9 MG/50ML-% IV SOLN: 20.0000 mg | Freq: Once | INTRAVENOUS | Status: DC | PRN

## 2019-10-29 MED ORDER — EPINEPHRINE 0.3 MG/0.3ML IJ SOAJ: 0.3000 mg | Freq: Once | INTRAMUSCULAR | Status: DC | PRN

## 2019-10-29 MED ORDER — ALBUTEROL SULFATE HFA 108 (90 BASE) MCG/ACT IN AERS: 2.0000 | INHALATION_SPRAY | Freq: Once | RESPIRATORY_TRACT | Status: DC | PRN

## 2019-10-29 MED ORDER — SODIUM CHLORIDE 0.9 % IV SOLN: INTRAVENOUS | Status: DC | PRN

## 2019-10-29 MED ORDER — SODIUM CHLORIDE 0.9 % IV SOLN
100.0000 mg | Freq: Once | INTRAVENOUS | Status: AC
  Administered 2019-10-29: 100 mg via INTRAVENOUS
  Filled 2019-10-29: qty 20

## 2019-10-29 MED ORDER — DIPHENHYDRAMINE HCL 50 MG/ML IJ SOLN: 50.0000 mg | Freq: Once | INTRAMUSCULAR | Status: DC | PRN

## 2019-10-29 NOTE — Discharge Instructions (Signed)
10 Things You Can Do to Manage Your COVID-19 Symptoms at Home If you have possible or confirmed COVID-19: 1. Stay home from work and school. And stay away from other public places. If you must go out, avoid using any kind of public transportation, ridesharing, or taxis. 2. Monitor your symptoms carefully. If your symptoms get worse, call your healthcare provider immediately. 3. Get rest and stay hydrated. 4. If you have a medical appointment, call the healthcare provider ahead of time and tell them that you have or may have COVID-19. 5. For medical emergencies, call 911 and notify the dispatch personnel that you have or may have COVID-19. 6. Cover your cough and sneezes with a tissue or use the inside of your elbow. 7. Wash your hands often with soap and water for at least 20 seconds or clean your hands with an alcohol-based hand sanitizer that contains at least 60% alcohol. 8. As much as possible, stay in a specific room and away from other people in your home. Also, you should use a separate bathroom, if available. If you need to be around other people in or outside of the home, wear a mask. 9. Avoid sharing personal items with other people in your household, like dishes, towels, and bedding. 10. Clean all surfaces that are touched often, like counters, tabletops, and doorknobs. Use household cleaning sprays or wipes according to the label instructions. cdc.gov/coronavirus 10/11/2018 This information is not intended to replace advice given to you by your health care provider. Make sure you discuss any questions you have with your health care provider. Document Revised: 03/15/2019 Document Reviewed: 03/15/2019 Elsevier Patient Education  2020 Elsevier Inc.  

## 2019-10-29 NOTE — Progress Notes (Signed)
  Diagnosis: COVID-19  Physician: Dr. Joya Gaskins  Procedure: Covid Infusion Clinic Med: remdesivir infusion - Provided patient with remdesivir fact sheet for patients, parents and caregivers prior to infusion.  Complications: No immediate complications noted.  Discharge: Discharged home   John [name], John [name] 10/29/2019

## 2019-10-29 NOTE — Telephone Encounter (Signed)
1st attempt. Unable to reach patient.  Also sent pt a MyChart msg.

## 2019-10-30 ENCOUNTER — Encounter: Payer: Self-pay | Admitting: Family Medicine

## 2019-10-30 ENCOUNTER — Ambulatory Visit (HOSPITAL_COMMUNITY)
Admit: 2019-10-30 | Discharge: 2019-10-30 | Disposition: A | Discharge status: Home / Self Care | Payer: Medicare Other | Attending: Pulmonary Disease | Admitting: Pulmonary Disease

## 2019-10-30 DIAGNOSIS — U071 COVID-19: Secondary | ICD-10-CM | POA: Insufficient documentation

## 2019-10-30 DIAGNOSIS — J1282 Pneumonia due to coronavirus disease 2019: Secondary | ICD-10-CM | POA: Insufficient documentation

## 2019-10-30 MED ORDER — METHYLPREDNISOLONE SODIUM SUCC 125 MG IJ SOLR: 125.0000 mg | Freq: Once | INTRAMUSCULAR | Status: DC | PRN

## 2019-10-30 MED ORDER — EPINEPHRINE 0.3 MG/0.3ML IJ SOAJ: 0.3000 mg | Freq: Once | INTRAMUSCULAR | Status: DC | PRN

## 2019-10-30 MED ORDER — FAMOTIDINE IN NACL 20-0.9 MG/50ML-% IV SOLN: 20.0000 mg | Freq: Once | INTRAVENOUS | Status: DC | PRN

## 2019-10-30 MED ORDER — SODIUM CHLORIDE 0.9 % IV SOLN: INTRAVENOUS | Status: DC | PRN

## 2019-10-30 MED ORDER — SODIUM CHLORIDE 0.9 % IV SOLN
100.0000 mg | Freq: Once | INTRAVENOUS | Status: AC
  Administered 2019-10-30: 100 mg via INTRAVENOUS
  Filled 2019-10-30: qty 20

## 2019-10-30 MED ORDER — DIPHENHYDRAMINE HCL 50 MG/ML IJ SOLN: 50.0000 mg | Freq: Once | INTRAMUSCULAR | Status: DC | PRN

## 2019-10-30 MED ORDER — ALBUTEROL SULFATE HFA 108 (90 BASE) MCG/ACT IN AERS: 2.0000 | INHALATION_SPRAY | Freq: Once | RESPIRATORY_TRACT | Status: DC | PRN

## 2019-10-30 NOTE — Telephone Encounter (Signed)
I have made two attempts and have been unable to reach patient. I have mailed the patient a letter requesting they call the office to schedule a hospital follow up appointment.   

## 2019-10-30 NOTE — Discharge Instructions (Signed)
10 Things You Can Do to Manage Your COVID-19 Symptoms at Home If you have possible or confirmed COVID-19: 1. Stay home from work and school. And stay away from other public places. If you must go out, avoid using any kind of public transportation, ridesharing, or taxis. 2. Monitor your symptoms carefully. If your symptoms get worse, call your healthcare provider immediately. 3. Get rest and stay hydrated. 4. If you have a medical appointment, call the healthcare provider ahead of time and tell them that you have or may have COVID-19. 5. For medical emergencies, call 911 and notify the dispatch personnel that you have or may have COVID-19. 6. Cover your cough and sneezes with a tissue or use the inside of your elbow. 7. Wash your hands often with soap and water for at least 20 seconds or clean your hands with an alcohol-based hand sanitizer that contains at least 60% alcohol. 8. As much as possible, stay in a specific room and away from other people in your home. Also, you should use a separate bathroom, if available. If you need to be around other people in or outside of the home, wear a mask. 9. Avoid sharing personal items with other people in your household, like dishes, towels, and bedding. 10. Clean all surfaces that are touched often, like counters, tabletops, and doorknobs. Use household cleaning sprays or wipes according to the label instructions. cdc.gov/coronavirus 10/11/2018 This information is not intended to replace advice given to you by your health care provider. Make sure you discuss any questions you have with your health care provider. Document Revised: 03/15/2019 Document Reviewed: 03/15/2019 Elsevier Patient Education  2020 Elsevier Inc.  

## 2019-10-30 NOTE — Progress Notes (Signed)
  Diagnosis: COVID-19  Physician:Dr Joya Gaskins  Procedure: Covid Infusion Clinic Med: remdesivir infusion - Provided patient with remdesivir fact sheet for patients, parents and caregivers prior to infusion.  Complications: No immediate complications noted.  Discharge: Discharged home   [name] Oats 10/30/2019

## 2019-10-31 LAB — CULTURE, BLOOD (ROUTINE X 2)
Culture: NO GROWTH
Culture: NO GROWTH
Special Requests: ADEQUATE

## 2019-11-23 ENCOUNTER — Encounter: Payer: Self-pay | Admitting: Family Medicine

## 2019-11-23 ENCOUNTER — Telehealth (INDEPENDENT_AMBULATORY_CARE_PROVIDER_SITE_OTHER): Payer: Medicare Other | Admitting: Family Medicine

## 2019-11-23 ENCOUNTER — Other Ambulatory Visit: Payer: Self-pay

## 2019-11-23 DIAGNOSIS — U071 COVID-19: Secondary | ICD-10-CM | POA: Diagnosis not present

## 2019-11-23 DIAGNOSIS — G933 Postviral fatigue syndrome: Secondary | ICD-10-CM | POA: Diagnosis not present

## 2019-11-23 DIAGNOSIS — G9331 Postviral fatigue syndrome: Secondary | ICD-10-CM

## 2019-11-23 NOTE — Progress Notes (Signed)
Chief Complaint  Patient presents with  . Follow-up    Covid--no energy    Subjective: Patient is a 74 y.o. male here for fatigue. Due to COVID-19 pandemic, we are interacting via web portal for an electronic face-to-face visit. I verified patient's ID using 2 identifiers. Patient agreed to proceed with visit via this method. Patient is at home, I am at office. Patient, his wife and I are present for visit.   Pt admitted to hosp for covid in July, d/c'd on 7/18.  Having fatigue now that is not getting better. No new SOB, cough, URI s/s's. Diet is back to normal. He is getting fluids in. Mood is normal.   Past Medical History:  Diagnosis Date  . BPH (benign prostatic hyperplasia)   . CTS (carpal tunnel syndrome)   . Depression   . Diabetes mellitus   . Essential hypertension   . GERD (gastroesophageal reflux disease)   . History of breast cancer   . Hypercholesteremia   . Neuropathy     Objective: No conversational dyspnea Age appropriate judgment and insight Nml affect and mood  Assessment and Plan: Postviral fatigue syndrome - Plan: CBC, Comprehensive metabolic panel, TSH, VITAMIN D 25 Hydroxy (Vit-D Deficiency, Fractures)  COVID-19   We will check labs in the future.  I did counsel him that this is sometimes seen after Covid.  We decided to see how things go over the next month and he will send a message to let me know if he wishes to start a medication.  We will consider SNRI therapy trial if no improvement.  Stay hydrated. Total time spent: 12 min The patient voiced understanding and agreement to the plan.  [place], DO 11/23/19  1:24 PM

## 2019-12-04 ENCOUNTER — Ambulatory Visit: Payer: Self-pay

## 2019-12-06 ENCOUNTER — Ambulatory Visit (INDEPENDENT_AMBULATORY_CARE_PROVIDER_SITE_OTHER): Payer: Medicare Other | Admitting: Nurse Practitioner

## 2019-12-06 ENCOUNTER — Other Ambulatory Visit: Payer: Self-pay

## 2019-12-06 VITALS — HR 98 | Temp 97.7°F

## 2019-12-06 DIAGNOSIS — R2681 Unsteadiness on feet: Secondary | ICD-10-CM

## 2019-12-06 DIAGNOSIS — Z8616 Personal history of COVID-19: Secondary | ICD-10-CM | POA: Diagnosis not present

## 2019-12-06 DIAGNOSIS — R5383 Other fatigue: Secondary | ICD-10-CM

## 2019-12-06 DIAGNOSIS — R5381 Other malaise: Secondary | ICD-10-CM

## 2019-12-06 NOTE — Progress Notes (Signed)
@Patient  ID: John [name], male    DOB: Apr 14, 1945, 74 y.o.   MRN: [id number]  Chief Complaint  Patient presents with   history of covid    Fatigue, memory loss, generalized weakness    Referring provider: Shelda Pal*   74 year old male with history of BPH, Depression, Diabetes, GERD, Neuropathy, hypercholesteremia. Diagnosed with Covid July 2021  HPI  Patient presents today for post COVID care clinic visit.  Patient was diagnosed with Covid at ED visit on 10/24/2019.  He returned to the ED on 10/26/2019 and was admitted.  Patient was diagnosed with Covid pneumonia.  Patient was treated with remdesivir, steroids, and oxygen.  Patient states that he continues to have ongoing fatigue, memory loss, and generalized weakness.  He tries to stay active at home.  He has noticed that his take more frequent rest breaks.  Patient has been trying to stay well-hydrated and states that he has been eating well.Denies f/c/s, n/v/d, hemoptysis, PND, chest pain or edema.      Allergies  Allergen Reactions   Ramipril Anaphylaxis   Atorvastatin     Other reaction(s): Other (See Comments) Calf cramps   Other Diarrhea    Severe intolerance to Chemotherapy in the past.   Adhesive [Tape] Rash    Immunization History  Administered Date(s) Administered   PFIZER SARS-COV-2 Vaccination 05/10/2019, 06/05/2019   Pneumococcal Conjugate-13 07/18/2013   Pneumococcal Polysaccharide-23 09/24/2010   Tdap 07/30/2011    Past Medical History:  Diagnosis Date   BPH (benign prostatic hyperplasia)    CTS (carpal tunnel syndrome)    Depression    Diabetes mellitus    Essential hypertension    GERD (gastroesophageal reflux disease)    History of breast cancer    Hypercholesteremia    Neuropathy     Tobacco History: Social History   Tobacco Use  Smoking Status Never Smoker  Smokeless Tobacco Never Used   Counseling given: Yes   Outpatient Encounter Medications as  of 12/06/2019  Medication Sig   acetaminophen (TYLENOL) 650 MG CR tablet Take 1,300 mg by mouth every 8 (eight) hours as needed for pain.   amLODipine (NORVASC) 5 MG tablet Take 5 mg by mouth daily.   Ascorbic Acid (VITAMIN C) 1000 MG tablet Take 1,000 mg by mouth in the morning and at bedtime.   aspirin 325 MG EC tablet Take 325 mg by mouth daily.   FLUoxetine (PROZAC) 20 MG capsule Take 20 mg by mouth daily.   gabapentin (NEURONTIN) 300 MG capsule Take 300 mg by mouth 6 (six) times daily.   insulin glargine (LANTUS) 100 UNIT/ML injection Inject 0.65 mLs (65 Units total) into the skin daily.   metFORMIN (GLUCOPHAGE-XR) 750 MG 24 hr tablet Take 1 tablet (750 mg total) by mouth daily with breakfast.   Multiple Vitamin (MULTIVITAMIN) tablet Take 1 tablet by mouth daily.   omeprazole (PRILOSEC) 40 MG capsule Take 40 mg by mouth daily.   tamoxifen (NOLVADEX) 20 MG tablet Take 20 mg by mouth daily.   zinc sulfate 220 (50 Zn) MG capsule Take 1 capsule (220 mg total) by mouth daily.   No facility-administered encounter medications on file as of 12/06/2019.     Review of Systems  Review of Systems  Constitutional: Positive for fatigue. Negative for chills and fever.  HENT: Negative.   Respiratory: Negative for cough and shortness of breath.   Cardiovascular: Negative.  Negative for chest pain, palpitations and leg swelling.  Gastrointestinal: Negative.  Allergic/Immunologic: Negative.   Neurological: Positive for weakness.  Psychiatric/Behavioral: Positive for confusion and decreased concentration.       Physical Exam  Pulse 98    Temp 97.7 F (36.5 C)    SpO2 98% Comment: RA  Wt Readings from Last 5 Encounters:  10/26/19 240 lb (108.9 kg)  10/24/19 240 lb (108.9 kg)  10/17/19 242 lb (109.8 kg)     Physical Exam Vitals and nursing note reviewed.  Constitutional:      General: He is not in acute distress.    Appearance: He is well-developed.  Cardiovascular:      Rate and Rhythm: Normal rate and regular rhythm.  Pulmonary:     Effort: Pulmonary effort is normal.     Breath sounds: Normal breath sounds.  Musculoskeletal:     Right lower leg: No edema.     Left lower leg: No edema.  Skin:    General: Skin is warm and dry.  Neurological:     Mental Status: He is alert and oriented to person, place, and time.  Psychiatric:        Mood and Affect: Mood normal.        Behavior: Behavior normal.       Assessment & Plan:   History of COVID-19 Covid Pneumonia Fatigue Physical deconditioning:  Glad you are improving!  Will place referral to neuro rehab  May start vitamin C, Vitamin D3, zinc  Stay active  Stay well hydrated  Hand out given on memory care  Will order follow up chest x ray    Follow up:  Follow up as needed      Fenton Foy, NP 12/10/2019

## 2019-12-06 NOTE — Patient Instructions (Addendum)
History of Covid Covid Pneumonia Fatigue Physical deconditioning:  Glad you are improving!  Will place referral to neuro rehab  May start vitamin C, Vitamin D3, zinc  Stay active  Stay well hydrated  Hand out given on memory care  Will order follow up chest x ray    Follow up:  Follow up as needed

## 2019-12-10 ENCOUNTER — Other Ambulatory Visit: Payer: Self-pay

## 2019-12-10 ENCOUNTER — Ambulatory Visit
Admission: RE | Admit: 2019-12-10 | Discharge: 2019-12-10 | Disposition: A | Discharge status: Home / Self Care | Payer: Medicare Other | Source: Ambulatory Visit | Attending: Nurse Practitioner | Admitting: Nurse Practitioner

## 2019-12-10 DIAGNOSIS — R5381 Other malaise: Secondary | ICD-10-CM | POA: Insufficient documentation

## 2019-12-10 DIAGNOSIS — R5383 Other fatigue: Secondary | ICD-10-CM | POA: Insufficient documentation

## 2019-12-10 DIAGNOSIS — Z8616 Personal history of COVID-19: Secondary | ICD-10-CM | POA: Insufficient documentation

## 2019-12-10 DIAGNOSIS — R2681 Unsteadiness on feet: Secondary | ICD-10-CM | POA: Insufficient documentation

## 2019-12-10 NOTE — Assessment & Plan Note (Signed)
Covid Pneumonia Fatigue Physical deconditioning:  Glad you are improving!  Will place referral to neuro rehab  May start vitamin C, Vitamin D3, zinc  Stay active  Stay well hydrated  Hand out given on memory care  Will order follow up chest x ray    Follow up:  Follow up as needed

## 2019-12-11 ENCOUNTER — Telehealth: Payer: Self-pay | Admitting: Nurse Practitioner

## 2019-12-11 NOTE — Telephone Encounter (Signed)
-----   Message from Fenton Foy, NP sent at 12/11/2019  4:13 PM EDT ----- Please call to let patient know that his chest x ray was clear.

## 2019-12-11 NOTE — Addendum Note (Signed)
Addended by: Kelle Darting A on: 12/11/2019 11:44 AM   Modules accepted: Orders

## 2019-12-11 NOTE — Telephone Encounter (Signed)
Left message with results. Advised to call back with any questions or concerns.

## 2019-12-12 ENCOUNTER — Other Ambulatory Visit: Payer: Self-pay

## 2019-12-12 ENCOUNTER — Other Ambulatory Visit: Payer: Medicare Other

## 2019-12-12 DIAGNOSIS — E559 Vitamin D deficiency, unspecified: Secondary | ICD-10-CM

## 2019-12-12 DIAGNOSIS — G9331 Postviral fatigue syndrome: Secondary | ICD-10-CM

## 2019-12-13 ENCOUNTER — Other Ambulatory Visit: Payer: Self-pay | Admitting: Family Medicine

## 2019-12-13 LAB — COMPREHENSIVE METABOLIC PANEL
AG Ratio: 1.8 (calc) (ref 1.0–2.5)
ALT: 18 U/L (ref 9–46)
AST: 19 U/L (ref 10–35)
Albumin: 3.8 g/dL (ref 3.6–5.1)
Alkaline phosphatase (APISO): 39 U/L (ref 35–144)
BUN: 18 mg/dL (ref 7–25)
CO2: 22 mmol/L (ref 20–32)
Calcium: 9.2 mg/dL (ref 8.6–10.3)
Chloride: 102 mmol/L (ref 98–110)
Creat: 1.02 mg/dL (ref 0.70–1.18)
Globulin: 2.1 g/dL (calc) (ref 1.9–3.7)
Glucose, Bld: 319 mg/dL — ABNORMAL HIGH (ref 65–99)
Potassium: 4.4 mmol/L (ref 3.5–5.3)
Sodium: 139 mmol/L (ref 135–146)
Total Bilirubin: 0.7 mg/dL (ref 0.2–1.2)
Total Protein: 5.9 g/dL — ABNORMAL LOW (ref 6.1–8.1)

## 2019-12-13 LAB — VITAMIN D 25 HYDROXY (VIT D DEFICIENCY, FRACTURES): Vit D, 25-Hydroxy: 16 ng/mL — ABNORMAL LOW (ref 30–100)

## 2019-12-13 LAB — CBC
HCT: 42.1 % (ref 38.5–50.0)
Hemoglobin: 13.8 g/dL (ref 13.2–17.1)
MCH: 30.9 pg (ref 27.0–33.0)
MCHC: 32.8 g/dL (ref 32.0–36.0)
MCV: 94.4 fL (ref 80.0–100.0)
MPV: 11 fL (ref 7.5–12.5)
Platelets: 198 10*3/uL (ref 140–400)
RBC: 4.46 10*6/uL (ref 4.20–5.80)
RDW: 14.5 % (ref 11.0–15.0)
WBC: 4.7 10*3/uL (ref 3.8–10.8)

## 2019-12-13 LAB — TSH: TSH: 1.54 mIU/L (ref 0.40–4.50)

## 2019-12-13 MED ORDER — VITAMIN D (ERGOCALCIFEROL) 1.25 MG (50000 UNIT) PO CAPS
50000.0000 [IU] | ORAL_CAPSULE | ORAL | 0 refills | Status: DC
Start: 2019-12-13 — End: 2020-03-11

## 2019-12-13 MED FILL — VIT D2 1.25 MG (50,000 UNIT: 1.25 MG | 84 days supply | Qty: 12 | Fill #0

## 2019-12-13 NOTE — Addendum Note (Signed)
Addended byDamita Dunnings D on: 12/13/2019 01:21 PM   Modules accepted: Orders

## 2019-12-18 ENCOUNTER — Ambulatory Visit: Payer: Self-pay

## 2019-12-21 ENCOUNTER — Encounter: Payer: Self-pay | Admitting: Family Medicine

## 2019-12-21 ENCOUNTER — Other Ambulatory Visit: Payer: Self-pay

## 2019-12-21 ENCOUNTER — Ambulatory Visit: Payer: Medicare Other | Admitting: Family Medicine

## 2019-12-21 VITALS — BP 134/76 | HR 91 | Temp 98.5°F | Ht 72.0 in | Wt 239.4 lb

## 2019-12-21 DIAGNOSIS — M549 Dorsalgia, unspecified: Secondary | ICD-10-CM | POA: Diagnosis not present

## 2019-12-21 NOTE — Progress Notes (Signed)
Musculoskeletal Exam  Patient: John [name] DOB: 11/03/1945  DOS: 12/21/2019  SUBJECTIVE:  Chief Complaint:   Chief Complaint  Patient presents with   Back Pain    2 weeks    John [name] is a 74 y.o.  male for evaluation and treatment of R upper back pain.   Onset:  3 weeks ago. No inj or change in activity.  Location: R upper back pain Character:  sharp  Progression of issue: Getting better Associated symptoms: no bruising, redness, swelling, GI s/s's, SOB, decreased ROM of neck or RUE  Treatment: to date has been heat.   Neurovascular symptoms: no  Past Medical History:  Diagnosis Date   BPH (benign prostatic hyperplasia)    CTS (carpal tunnel syndrome)    Depression    Diabetes mellitus    Essential hypertension    GERD (gastroesophageal reflux disease)    History of breast cancer    Hypercholesteremia    Neuropathy     Objective: VITAL SIGNS: BP 134/76 (BP Location: Right Arm, Patient Position: Sitting, Cuff Size: Normal)    Pulse 91    Temp 98.5 F (36.9 C) (Oral)    Ht 6' (1.829 m)    Wt 239 lb 6 oz (108.6 kg)    SpO2 96%    BMI 32.47 kg/m  Constitutional: Well formed, well developed. No acute distress. Thorax & Lungs: No accessory muscle use Musculoskeletal: upper back.   Normal active range of motion: yes, RUE Normal passive range of motion: yes, RUE Tenderness to palpation: Mild tenderness to palpation of the right lower thoracic paraspinal musculature at around T9 and 10; no CVA tenderness Deformity: no Ecchymosis: no Neurologic: Normal sensory function. No focal deficits noted. DTR's equal and symmetric in UE's. No clonus. Psychiatric: Normal mood. Age appropriate judgment and insight. Alert & oriented x 3.    Assessment:  Mid back pain on right side  Plan: Stretches/exercises, heat, ice, Tylenol.  Sounds like he is over the worst of it.  He will let us know if anything changes. F/u as originally scheduled. The patient voiced  understanding and agreement to the plan.   [place], DO 12/21/19  3:05 PM

## 2019-12-21 NOTE — Patient Instructions (Addendum)
Ice/cold pack over area for 10-15 min twice daily.  Heat (pad or rice pillow in microwave) over affected area, 10-15 minutes twice daily.   OK to take Tylenol 1000 mg (2 extra strength tabs) or 975 mg (3 regular strength tabs) every 6 hours as needed.  Mid-Back Strain Rehab It is normal to feel mild stretching, pulling, tightness, or discomfort as you do these exercises, but you should stop right away if you feel sudden pain or your pain gets worse.  Stretching and range of motion exercises This exercise warms up your muscles and joints and improves the movement and flexibility of your back and shoulders. This exercise also help to relieve pain. Exercise A: Chest and spine stretch  1. Lie down on your back on a firm surface. 2. Roll a towel or a small blanket so it is about 4 inches (10 cm) in diameter. 3. Put the towel lengthwise under the middle of your back so it is under your spine, but not under your shoulder blades. 4. To increase the stretch, you may put your hands behind your head and let your elbows fall to your sides. 5. Hold for 30 seconds. Repeat exercise 2 times. Complete this exercise 3 times per week. Strengthening exercises These exercises build strength and endurance in your back and your shoulder blade muscles. Endurance is the ability to use your muscles for a long time, even after they get tired. Exercise C: Straight arm rows (shoulder extension) 1. Stand with your feet shoulder width apart. 2. Secure an exercise band to a stable object in front of you so the band is at or above shoulder height. 3. Hold one end of the exercise band in each hand. 4. Straighten your elbows and lift your hands up to shoulder height. 5. Step back, away from the secured end of the exercise band, until the band stretches. 6. Squeeze your shoulder blades together and pull your hands down to the sides of your thighs. Stop when your hands are straight down by your sides. Do not let your hands go  behind your body. 7. Hold for 2 seconds. 8. Slowly return to the starting position. Repeat 2 times. Complete this exercise 3 times per week. Exercise D: Shoulder external rotation, prone 1. Lie on your abdomen on a firm bed so your left / right forearm hangs over the edge of the bed and your upper arm is on the bed, straight out from your body. ? Your elbow should be bent. ? Your palm should be facing your feet. 2. If instructed, hold a 2-5 lb weight in your hand. 3. Squeeze your shoulder blade toward the middle of your back. Do not let your shoulder lift toward your ear. 4. Keep your elbow bent in an "L" shape (90 degrees) while you slowly move your forearm up toward the ceiling. Move your forearm up to the height of the bed, toward your head. ? Your upper arm should not move. ? At the top of the movement, your palm should face the floor. 5. Hold for 1 second. 6. Slowly return to the starting position and relax your muscles. Repeat 2 times. Complete this exercise 3 times per week. Exercise E: Scapular retraction and external rotation, rowing  1. Sit in a stable chair without armrests, or stand. 2. Secure an exercise band to a stable object in front of you so it is at shoulder height. 3. Hold one end of the exercise band in each hand. 4. Bring your arms out  straight in front of you. 5. Step back, away from the secured end of the exercise band, until the band stretches. 6. Pull the band backward. As you do this, bend your elbows and squeeze your shoulder blades together, but avoid letting the rest of your body move. Do not let your shoulders lift up toward your ears. 7. Stop when your elbows are at your sides or slightly behind your body. 8. Hold for 1 second1. 9. Slowly straighten your arms to return to the starting position. Repeat 2 times. Complete this exercise 3 times per week. Posture and body mechanics  Body mechanics refers to the movements and positions of your body while you do  your daily activities. Posture is part of body mechanics. Good posture and healthy body mechanics can help to relieve stress in your body's tissues and joints. Good posture means that your spine is in its natural S-curve position (your spine is neutral), your shoulders are pulled back slightly, and your head is not tipped forward. The following are general guidelines for applying improved posture and body mechanics to your everyday activities. Standing   When standing, keep your spine neutral and your feet about hip-width apart. Keep a slight bend in your knees. Your ears, shoulders, and hips should line up.  When you do a task in which you lean forward while standing in one place for a long time, place one foot up on a stable object that is 2-4 inches (5-10 cm) high, such as a footstool. This helps keep your spine neutral. Sitting   When sitting, keep your spine neutral and keep your feet flat on the floor. Use a footrest, if necessary, and keep your thighs parallel to the floor. Avoid rounding your shoulders, and avoid tilting your head forward.  When working at a desk or a computer, keep your desk at a height where your hands are slightly lower than your elbows. Slide your chair under your desk so you are close enough to maintain good posture.  When working at a computer, place your monitor at a height where you are looking straight ahead and you do not have to tilt your head forward or downward to look at the screen. Resting  When lying down and resting, avoid positions that are most painful for you.  If you have pain with activities such as sitting, bending, stooping, or squatting (flexion-based activities), lie in a position in which your body does not bend very much. For example, avoid curling up on your side with your arms and knees near your chest (fetal position).  If you have pain with activities such as standing for a long time or reaching with your arms (extension-based activities),  lie with your spine in a neutral position and bend your knees slightly. Try the following positions:  Lying on your side with a pillow between your knees.  Lying on your back with a pillow under your knees.  Lifting   When lifting objects, keep your feet at least shoulder-width apart and tighten your abdominal muscles.  Bend your knees and hips and keep your spine neutral. It is important to lift using the strength of your legs, not your back. Do not lock your knees straight out.  Always ask for help to lift heavy or awkward objects. Make sure you discuss any questions you have with your health care provider. Document Released: 03/29/2005 Document Revised: 12/04/2015 Document Reviewed: 01/08/2015 Elsevier Interactive Patient Education  [employer].

## 2020-01-15 ENCOUNTER — Ambulatory Visit: Payer: Medicare Other | Attending: Internal Medicine

## 2020-02-04 ENCOUNTER — Other Ambulatory Visit (HOSPITAL_BASED_OUTPATIENT_CLINIC_OR_DEPARTMENT_OTHER): Payer: Self-pay | Admitting: Internal Medicine

## 2020-02-04 ENCOUNTER — Ambulatory Visit: Payer: Medicare Other | Attending: Internal Medicine

## 2020-02-04 DIAGNOSIS — Z23 Encounter for immunization: Secondary | ICD-10-CM

## 2020-02-04 NOTE — Progress Notes (Signed)
   Covid-19 Vaccination Clinic  Name:  John [name]    MRN: [id number] DOB: 17-Aug-1945  02/04/2020  [name] was observed post Covid-19 immunization for 15 minutes without incident. He was provided with Vaccine Information Sheet and instruction to access the V-Safe system. Vaccinated by Evette Georges.  [name] was instructed to call 911 with any severe reactions post vaccine: Marland Kitchen Difficulty breathing  . Swelling of face and throat  . A fast heartbeat  . A bad rash all over body  . Dizziness and weakness

## 2020-02-07 ENCOUNTER — Ambulatory Visit: Payer: Medicare Other

## 2020-02-08 MED FILL — PFIZER-BIONTECH COVID-19 VA: 30 | 1 days supply | Qty: 0 | Fill #0

## 2020-02-18 ENCOUNTER — Ambulatory Visit: Payer: Medicare Other | Admitting: Family Medicine

## 2020-02-25 ENCOUNTER — Encounter: Payer: Self-pay | Admitting: Family Medicine

## 2020-02-25 ENCOUNTER — Ambulatory Visit: Payer: Medicare Other | Admitting: Family Medicine

## 2020-02-25 ENCOUNTER — Other Ambulatory Visit: Payer: Self-pay

## 2020-02-25 VITALS — BP 126/60 | HR 85 | Temp 98.8°F | Resp 12 | Ht 72.0 in | Wt 238.6 lb

## 2020-02-25 DIAGNOSIS — G4700 Insomnia, unspecified: Secondary | ICD-10-CM

## 2020-02-25 DIAGNOSIS — I10 Essential (primary) hypertension: Secondary | ICD-10-CM

## 2020-02-25 DIAGNOSIS — F325 Major depressive disorder, single episode, in full remission: Secondary | ICD-10-CM

## 2020-02-25 MED ORDER — FLUOXETINE HCL 20 MG PO CAPS: 20.0000 mg | ORAL_CAPSULE | Freq: Every day | ORAL | 2 refills | Status: DC

## 2020-02-25 NOTE — Patient Instructions (Addendum)
Keep the diet clean and stay active.  Sleep Hygiene Tips:  Do not watch TV or look at screens within 1 hour of going to bed. If you do, make sure there is a blue light filter (nighttime mode) involved.  Try to go to bed around the same time every night. Wake up at the same time within 1 hour of regular time. Ex: If you wake up at 7 AM for work, do not sleep past 8 AM on days that you don't work.  Do not drink alcohol before bedtime.  Do not consume caffeine-containing beverages after noon or within 9 hours of intended bedtime.  Get regular exercise/physical activity in your life, but not within 2 hours of planned bedtime.  Do not take naps.    Do not eat within 2 hours of planned bedtime.  Melatonin, 3-5 mg 30-60 minutes before planned bedtime may be helpful.   The bed should be for sleep or sex only. If after 20-30 minutes you are unable to fall asleep, get up and do something relaxing. Do this until you feel ready to go to sleep again.   Aim to do some physical exertion for 150 minutes per week. This is typically divided into 5 days per week, 30 minutes per day. The activity should be enough to get your heart rate up. Anything is better than nothing if you have time constraints.  Sleep is important to [place] all. Getting good sleep is imperative to adequate functioning during the day. Work with our counselors who are trained to help people obtain quality sleep. Call [phone] to schedule an appointment or if you are curious about insurance coverage/cost.  Let us know if you need anything.

## 2020-02-25 NOTE — Progress Notes (Signed)
Chief Complaint  Patient presents with  . medication check  . vitamin d check    Subjective John [name] presents for f/u anxiety/depression.  Pt is currently being treated with Prozac.  Reports doing well since treatment. No thoughts of harming self or others. No self-medication with alcohol, prescription drugs or illicit drugs. Pt is not following with a counselor/psychologist.  Hypertension Patient presents for hypertension follow up. He does monitor home blood pressures. Blood pressures ranging on average from 120-140's/80's. He is compliant with medications- Norvasc 5 mg/d. Patient has these side effects of medication: none He is adhering to a healthy diet overall. Exercise: walking  Naps during day up to 90 min at a time. He will go to bed around 1130-12 and wake up around 5-630 to urinate and be unable to fall back asleep. Racing thoughts. Mood is controlled as above. Has not tried anything else.   Past Medical History:  Diagnosis Date  . BPH (benign prostatic hyperplasia)   . CTS (carpal tunnel syndrome)   . Depression   . Diabetes mellitus   . Essential hypertension   . GERD (gastroesophageal reflux disease)   . History of breast cancer   . Hypercholesteremia   . Neuropathy    Allergies as of 02/25/2020      Reactions   Ramipril Anaphylaxis   Atorvastatin    Other reaction(s): Other (See Comments) Calf cramps   Other Diarrhea   Severe intolerance to Chemotherapy in the past.   Sertraline Other (See Comments)   "Extreme headaches"   Adhesive [tape] Rash      Medication List       Accurate as of February 25, 2020  4:48 PM. If you have any questions, ask your nurse or doctor.        acetaminophen 650 MG CR tablet Commonly known as: TYLENOL Take 1,300 mg by mouth every 8 (eight) hours as needed for pain.   amLODipine 5 MG tablet Commonly known as: NORVASC Take 5 mg by mouth daily.   aspirin 325 MG EC tablet Take 325 mg by mouth daily.     FLUoxetine 20 MG capsule Commonly known as: PROZAC Take 1 capsule (20 mg total) by mouth daily.   gabapentin 300 MG capsule Commonly known as: NEURONTIN Take 300 mg by mouth 6 (six) times daily.   insulin glargine 100 UNIT/ML injection Commonly known as: Lantus Inject 0.65 mLs (65 Units total) into the skin daily.   multivitamin tablet Take 1 tablet by mouth daily.   omeprazole 40 MG capsule Commonly known as: PRILOSEC Take 40 mg by mouth daily.   tamoxifen 20 MG tablet Commonly known as: NOLVADEX Take 20 mg by mouth daily.   vitamin C 1000 MG tablet Take 1,000 mg by mouth in the morning and at bedtime.   Vitamin D (Ergocalciferol) 1.25 MG (50000 UNIT) Caps capsule Commonly known as: DRISDOL Take 1 capsule (50,000 Units total) by mouth every 7 (seven) days.   zinc sulfate 220 (50 Zn) MG capsule Take 1 capsule (220 mg total) by mouth daily.       Exam BP 126/60 (BP Location: Right Arm, Cuff Size: Large)   Pulse 85   Temp 98.8 F (37.1 C) (Oral)   Resp 12   Ht 6' (1.829 m)   Wt 238 lb 9.6 oz (108.2 kg)   SpO2 96%   BMI 32.36 kg/m  General:  well developed, well nourished, in no apparent distress Heart: RRR, 1+ LE edema b/l tapering at  prox 1/3 of tibia Lungs:  CTAB. No respiratory distress Psych: well oriented with normal range of affect and age-appropriate judgement/insight, alert and oriented x4.  Assessment and Plan  Depression, major, single episode, complete remission ([place])  Essential hypertension  Insomnia, unspecified type  1. Cont Prozac 20 mg/d He would like to cont until he is fully weaned from Neurontin which I am fine with.  2. Controlled, cont Norvasc 5 mg/d.  3. Needs to focus on sleep hygiene and stop napping during the day. CBT info given.  F/u in 6 mo. The patient voiced understanding and agreement to the plan.  [place], DO 02/25/20 4:48 PM

## 2020-02-26 ENCOUNTER — Other Ambulatory Visit: Payer: Self-pay | Admitting: *Deleted

## 2020-02-26 ENCOUNTER — Telehealth: Payer: Self-pay | Admitting: *Deleted

## 2020-02-26 NOTE — Telephone Encounter (Signed)
Patient left without getting pneumovax.  Wife called back and appointment was set up for next week.  She also stated that patient wanted appt for lab for vit d.  appt set up for nurse visit for pneumovax and lab visit for vit d.  Future vit d was already place and will use for lab visit.

## 2020-03-04 ENCOUNTER — Other Ambulatory Visit: Payer: Medicare Other

## 2020-03-04 ENCOUNTER — Ambulatory Visit: Payer: Medicare Other

## 2020-03-10 ENCOUNTER — Encounter: Payer: Self-pay | Admitting: Family Medicine

## 2020-03-10 ENCOUNTER — Ambulatory Visit: Payer: Medicare Other | Admitting: Family Medicine

## 2020-03-10 ENCOUNTER — Other Ambulatory Visit: Payer: Self-pay

## 2020-03-10 VITALS — BP 162/78 | HR 70 | Temp 98.3°F | Ht 72.0 in | Wt 237.5 lb

## 2020-03-10 DIAGNOSIS — R5383 Other fatigue: Secondary | ICD-10-CM

## 2020-03-10 DIAGNOSIS — Z23 Encounter for immunization: Secondary | ICD-10-CM | POA: Diagnosis not present

## 2020-03-10 NOTE — Patient Instructions (Signed)
Give [place] 2-3 business days to get the results of your labs back.   Keep an eye on your sleeping over the next couple weeks. Let me know on MyChart.   We can consider a sleep team referral and treating anxiety (irritability).   Let us know if you need anything.

## 2020-03-10 NOTE — Progress Notes (Signed)
Chief Complaint  Patient presents with  . Fatigue    Subjective: Patient is a 74 y.o. male here for fatigue. Here w wife [name].   Patient has been experiencing fatigue over the past several months since having Covid. He does not notice as much of an issue anymore as she does. She states he will sleep a full night and then sleeps throughout the day. Naps are multiple and can range from 30 minutes to 2 hours. The patient states that when he has things to do, he is able to do them without requiring naps in between. He feels fairly refreshed in the morning. He does snore but does not wake up gasping for air. No witnessed apneic spells. He is never had a sleep study. His mood is fair overall. Since stopping fluoxetine, he has slept better but has not had good control of her irritability symptoms per his family. His diet is fair. He does not exercise routinely.  Past Medical History:  Diagnosis Date  . BPH (benign prostatic hyperplasia)   . CTS (carpal tunnel syndrome)   . Depression   . Diabetes mellitus   . Essential hypertension   . GERD (gastroesophageal reflux disease)   . History of breast cancer   . Hypercholesteremia   . Neuropathy     Objective: BP (!) 162/78 (BP Location: Left Arm, Patient Position: Sitting, Cuff Size: Normal)   Pulse 70   Temp 98.3 F (36.8 C) (Oral)   Ht 6' (1.829 m)   Wt 237 lb 8 oz (107.7 kg)   SpO2 96%   BMI 32.21 kg/m  General: Awake, appears stated age HEENT: MMM, EOMi Heart: RRR, no lower extremity edema Lungs: CTAB, no rales, wheezes or rhonchi. No accessory muscle use Psych: Age appropriate judgment and insight, normal affect and mood  Assessment and Plan: Fatigue, unspecified type - Plan: VITAMIN D 25 Hydroxy (Vit-D Deficiency, Fractures)  Need for vaccination against Streptococcus pneumoniae - Plan: Pneumococcal polysaccharide vaccine 23-valent greater than or equal to 2yo subcutaneous/IM  Recheck vitamin D deficiency to see if he needs  continued supplementation. I doubt this is the main cause. His wife is telling the one thing that he is told me another. They will keep a close eye on his sleeping habits and fatigue levels over the next 2 weeks. They will send me a message and we will decide what to do from there. If he is truly having fatigue, I will set him up with the sleep team and consider treating for anxiety. If not, we can continue to monitor. The patient and his wife voiced understanding and agreement to the plan.  Greater than 30 minutes were spent face to face with the patient, discussing the plan, reviewing previous labs and hospitalization data.  [place], DO 03/10/20  4:05 PM

## 2020-03-11 ENCOUNTER — Other Ambulatory Visit: Payer: Self-pay | Admitting: Family Medicine

## 2020-03-11 DIAGNOSIS — R5383 Other fatigue: Secondary | ICD-10-CM

## 2020-03-11 DIAGNOSIS — E559 Vitamin D deficiency, unspecified: Secondary | ICD-10-CM

## 2020-03-11 LAB — VITAMIN D 25 HYDROXY (VIT D DEFICIENCY, FRACTURES): VITD: 17.44 ng/mL — ABNORMAL LOW (ref 30.00–100.00)

## 2020-03-11 MED ORDER — VITAMIN D (ERGOCALCIFEROL) 1.25 MG (50000 UNIT) PO CAPS: 50000.0000 [IU] | ORAL_CAPSULE | ORAL | 0 refills | Status: DC

## 2020-03-11 MED FILL — VIT D2 1.25 MG (50,000 UNIT: 1.25 MG | 90 days supply | Qty: 12 | Fill #0

## 2020-03-11 NOTE — Progress Notes (Signed)
vi 

## 2020-03-18 ENCOUNTER — Other Ambulatory Visit: Payer: Medicare Other

## 2020-03-18 ENCOUNTER — Ambulatory Visit: Payer: Medicare Other

## 2020-06-04 DIAGNOSIS — R339 Retention of urine, unspecified: Secondary | ICD-10-CM | POA: Insufficient documentation

## 2020-06-04 DIAGNOSIS — R39198 Other difficulties with micturition: Secondary | ICD-10-CM | POA: Insufficient documentation

## 2020-06-04 DIAGNOSIS — R338 Other retention of urine: Secondary | ICD-10-CM | POA: Insufficient documentation

## 2020-06-10 ENCOUNTER — Other Ambulatory Visit: Payer: Medicare Other

## 2020-06-11 ENCOUNTER — Other Ambulatory Visit: Payer: Self-pay

## 2020-06-11 ENCOUNTER — Other Ambulatory Visit (INDEPENDENT_AMBULATORY_CARE_PROVIDER_SITE_OTHER): Payer: Medicare Other

## 2020-06-11 DIAGNOSIS — E559 Vitamin D deficiency, unspecified: Secondary | ICD-10-CM

## 2020-06-11 DIAGNOSIS — R5383 Other fatigue: Secondary | ICD-10-CM | POA: Diagnosis not present

## 2020-06-12 ENCOUNTER — Other Ambulatory Visit: Payer: Self-pay | Admitting: Family Medicine

## 2020-06-12 LAB — VITAMIN D 25 HYDROXY (VIT D DEFICIENCY, FRACTURES): VITD: 24.14 ng/mL — ABNORMAL LOW (ref 30.00–100.00)

## 2020-06-12 MED ORDER — VITAMIN D (ERGOCALCIFEROL) 1.25 MG (50000 UNIT) PO CAPS: 50000.0000 [IU] | ORAL_CAPSULE | ORAL | 0 refills | Status: DC

## 2020-06-12 MED FILL — VIT D2 1.25 MG (50,000 UNIT: 1.25 MG | 84 days supply | Qty: 12 | Fill #0

## 2020-07-17 ENCOUNTER — Telehealth: Payer: Self-pay

## 2020-07-17 ENCOUNTER — Other Ambulatory Visit: Payer: Self-pay | Admitting: Family Medicine

## 2020-07-17 NOTE — Telephone Encounter (Signed)
Called pt and lvm to return call in regards to medication refills

## 2020-07-17 NOTE — Telephone Encounter (Signed)
Caller states that he needs to get his medications refilled. His wife needs a refill as well. Her name is [name], [place] 02-23-1947.  Telephone: [phone]

## 2020-07-17 NOTE — Telephone Encounter (Signed)
Medications sent in.

## 2020-08-18 DIAGNOSIS — G9331 Postviral fatigue syndrome: Secondary | ICD-10-CM | POA: Insufficient documentation

## 2020-08-18 DIAGNOSIS — R4189 Other symptoms and signs involving cognitive functions and awareness: Secondary | ICD-10-CM | POA: Insufficient documentation

## 2020-08-22 ENCOUNTER — Other Ambulatory Visit: Payer: Self-pay

## 2020-08-22 ENCOUNTER — Telehealth (INDEPENDENT_AMBULATORY_CARE_PROVIDER_SITE_OTHER): Payer: Medicare Other | Admitting: Family Medicine

## 2020-08-22 ENCOUNTER — Encounter: Payer: Self-pay | Admitting: Family Medicine

## 2020-08-22 DIAGNOSIS — R2681 Unsteadiness on feet: Secondary | ICD-10-CM

## 2020-08-22 DIAGNOSIS — C801 Malignant (primary) neoplasm, unspecified: Secondary | ICD-10-CM | POA: Diagnosis not present

## 2020-08-22 DIAGNOSIS — G63 Polyneuropathy in diseases classified elsewhere: Secondary | ICD-10-CM

## 2020-08-22 NOTE — Progress Notes (Signed)
Chief Complaint  Patient presents with  . Leg Pain    Subjective: Patient is a 75 y.o. male here for leg pain. Due to COVID-19 pandemic, we are interacting via telephone. I verified patient's ID using 2 identifiers. Patient agreed to proceed with visit via this method. Patient is at home, I am at home. Patient and I are present for visit.   Pt w hx of neuropathy and unsteady gait. He is requesting a referral to PT. He has failed Neurontin in the past for this as it did not work. Not interested in trying anything else at this time for the neuropathy.   Past Medical History:  Diagnosis Date  . BPH (benign prostatic hyperplasia)   . CTS (carpal tunnel syndrome)   . Depression   . Diabetes mellitus   . Essential hypertension   . GERD (gastroesophageal reflux disease)   . History of breast cancer   . Hypercholesteremia   . Neuropathy     Objective: No conversational dyspnea Age appropriate judgment and insight Nml affect and mood  Assessment and Plan: Neuropathy associated with cancer ([place]) - Plan: Ambulatory referral to Physical Therapy  Unsteady gait - Plan: Ambulatory referral to Physical Therapy  Refer to above. Offered other meds for #1, declined at this time. Total time: 7 min The patient voiced understanding and agreement to the plan.  [place], DO 08/22/20  10:05 AM

## 2020-09-10 ENCOUNTER — Encounter: Payer: Self-pay | Admitting: Family Medicine

## 2020-09-10 ENCOUNTER — Other Ambulatory Visit: Payer: Self-pay | Admitting: Family Medicine

## 2020-09-10 ENCOUNTER — Other Ambulatory Visit: Payer: Self-pay

## 2020-09-10 ENCOUNTER — Ambulatory Visit (INDEPENDENT_AMBULATORY_CARE_PROVIDER_SITE_OTHER): Payer: Medicare Other | Admitting: Family Medicine

## 2020-09-10 VITALS — BP 138/62 | HR 66 | Temp 98.4°F | Ht 72.0 in | Wt 241.0 lb

## 2020-09-10 DIAGNOSIS — C801 Malignant (primary) neoplasm, unspecified: Secondary | ICD-10-CM | POA: Diagnosis not present

## 2020-09-10 DIAGNOSIS — Z1159 Encounter for screening for other viral diseases: Secondary | ICD-10-CM | POA: Diagnosis not present

## 2020-09-10 DIAGNOSIS — I700 Atherosclerosis of aorta: Secondary | ICD-10-CM | POA: Insufficient documentation

## 2020-09-10 DIAGNOSIS — Z794 Long term (current) use of insulin: Secondary | ICD-10-CM

## 2020-09-10 DIAGNOSIS — G4700 Insomnia, unspecified: Secondary | ICD-10-CM | POA: Insufficient documentation

## 2020-09-10 DIAGNOSIS — G63 Polyneuropathy in diseases classified elsewhere: Secondary | ICD-10-CM

## 2020-09-10 DIAGNOSIS — F325 Major depressive disorder, single episode, in full remission: Secondary | ICD-10-CM

## 2020-09-10 DIAGNOSIS — E785 Hyperlipidemia, unspecified: Secondary | ICD-10-CM

## 2020-09-10 DIAGNOSIS — E1165 Type 2 diabetes mellitus with hyperglycemia: Secondary | ICD-10-CM | POA: Diagnosis not present

## 2020-09-10 DIAGNOSIS — R5383 Other fatigue: Secondary | ICD-10-CM

## 2020-09-10 DIAGNOSIS — Z0000 Encounter for general adult medical examination without abnormal findings: Secondary | ICD-10-CM

## 2020-09-10 LAB — LIPID PANEL
Cholesterol: 150 mg/dL (ref 0–200)
HDL: 26.4 mg/dL — ABNORMAL LOW (ref >39.00)
NonHDL: 123.53
Total CHOL/HDL Ratio: 6
Triglycerides: 378 mg/dL — ABNORMAL HIGH (ref 0.0–149.0)
VLDL: 75.6 mg/dL — ABNORMAL HIGH (ref 0.0–40.0)

## 2020-09-10 LAB — LDL CHOLESTEROL, DIRECT: Direct LDL: 59 mg/dL

## 2020-09-10 LAB — COMPREHENSIVE METABOLIC PANEL
ALT: 28 U/L (ref 0–53)
AST: 28 U/L (ref 0–37)
Albumin: 3.9 g/dL (ref 3.5–5.2)
Alkaline Phosphatase: 44 U/L (ref 39–117)
BUN: 18 mg/dL (ref 6–23)
CO2: 26 mEq/L (ref 19–32)
Calcium: 9.5 mg/dL (ref 8.4–10.5)
Chloride: 102 mEq/L (ref 96–112)
Creatinine, Ser: 0.87 mg/dL (ref 0.40–1.50)
GFR: 85.11 mL/min (ref >60.00)
Glucose, Bld: 191 mg/dL — ABNORMAL HIGH (ref 70–99)
Potassium: 3.9 mEq/L (ref 3.5–5.1)
Sodium: 138 mEq/L (ref 135–145)
Total Bilirubin: 0.9 mg/dL (ref 0.2–1.2)
Total Protein: 5.7 g/dL — ABNORMAL LOW (ref 6.0–8.3)

## 2020-09-10 LAB — HEMOGLOBIN A1C: Hgb A1c MFr Bld: 10.5 % — ABNORMAL HIGH (ref 4.6–6.5)

## 2020-09-10 MED ORDER — AMITRIPTYLINE HCL 25 MG PO TABS: 25.0000 mg | ORAL_TABLET | Freq: Every evening | ORAL | 1 refills | Status: DC | PRN

## 2020-09-10 MED ORDER — ATORVASTATIN CALCIUM 40 MG PO TABS: 40.0000 mg | ORAL_TABLET | Freq: Every day | ORAL | 3 refills | Status: DC

## 2020-09-10 NOTE — Progress Notes (Signed)
Chief Complaint  Patient presents with  . Annual Exam    Well Male John [name] is here for a complete physical.   His last physical was >1 year ago.  Current diet: in general, diet could be better.   Current exercise: none Weight trend: stable Fatigue out of ordinary? Yes Seat belt? Yes.    Health maintenance Shingrix- No Colonoscopy- Yes Tetanus- Yes Hep C- No Pneumonia vaccine- Yes   Fatigue/insomnia Patient has a longstanding history of fatigue and insomnia.  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  This largely keeps him up.  He has failed Neurontin.  Sometimes he will have racing thoughts.  He gets around 7 hours of sleep nightly and will sometimes nap between 30 and 120 minutes daily.  Nevertheless does not seem to affect his ability to fall asleep.  Takes between 2 to 3 hours for him to fall asleep.  Past Medical History:  Diagnosis Date  . BPH (benign prostatic hyperplasia)   . CTS (carpal tunnel syndrome)   . Depression   . Diabetes mellitus   . Essential hypertension   . GERD (gastroesophageal reflux disease)   . History of breast cancer   . Hypercholesteremia   . Neuropathy      Past Surgical History:  Procedure Laterality Date  . CATARACT EXTRACTION, BILATERAL  01/18/2017  . LITHOTRIPSY    . MASTECTOMY Right 05/02/2018  . TONSILLECTOMY AND ADENOIDECTOMY      Medications  Current Outpatient Medications on File Prior to Visit  Medication Sig Dispense Refill  . acetaminophen (TYLENOL) 650 MG CR tablet Take 1,300 mg by mouth every 8 (eight) hours as needed for pain.    Marland Kitchen amLODipine (NORVASC) 5 MG tablet TAKE 1 TABLET(5 MG) BY MOUTH DAILY 90 tablet 3  . Ascorbic Acid (VITAMIN C) 1000 MG tablet Take 1,000 mg by mouth in the morning and at bedtime.    Marland Kitchen aspirin 325 MG EC tablet Take 325 mg by mouth daily.    Marland Kitchen COVID-19 mRNA vaccine, Pfizer, 30 MCG/0.3ML injection INJECT AS DIRECTED .3 mL 0  . insulin  glargine (LANTUS) 100 UNIT/ML injection Inject 0.65 mLs (65 Units total) into the skin daily. (Patient taking differently: Inject 80 Units into the skin daily.) 10 mL 11  . Multiple Vitamin (MULTIVITAMIN) tablet Take 1 tablet by mouth daily.    Marland Kitchen omeprazole (PRILOSEC) 40 MG capsule TAKE 1 CAPSULE(40 MG) BY MOUTH DAILY 90 capsule 1  . tamoxifen (NOLVADEX) 20 MG tablet Take 20 mg by mouth daily.    . Vitamin D, Ergocalciferol, (DRISDOL) 1.25 MG (50000 UNIT) CAPS capsule TAKE 1 CAPSULE BY MOUTH EVERY 7 DAYS 12 capsule 0  . zinc sulfate 220 (50 Zn) MG capsule Take 1 capsule (220 mg total) by mouth daily. 30 capsule 1    Allergies Allergies  Allergen Reactions  . Ramipril Anaphylaxis  . Other Diarrhea    Severe intolerance to Chemotherapy in the past.  . Sertraline Other (See Comments)    "Extreme headaches"  . Adhesive [Tape] Rash    Family History Family History  Problem Relation Age of Onset  . Lymphoma Mother   . Cancer Mother   . Diabetes Father   . Stroke Father   . Ovarian cancer Sister   . Cancer Sister   . Cancer Paternal Grandmother     Review of Systems: Constitutional:  no fevers Eye:  no recent significant change in vision Ears:  No changes in hearing Nose/Mouth/Throat:  no complaints of nasal congestion, no sore throat Cardiovascular: no chest pain Respiratory:  No shortness of breath Gastrointestinal:  No change in bowel habits GU:  No frequency Integumentary:  no abnormal skin lesions reported Neurologic:  no headaches Endocrine:  denies unexplained weight changes  Exam BP 138/62 (BP Location: Left Arm, Patient Position: Sitting, Cuff Size: Normal)   Pulse 66   Temp 98.4 F (36.9 C) (Oral)   Ht 6' (1.829 m)   Wt 241 lb (109.3 kg)   SpO2 95%   BMI 32.69 kg/m  General:  well developed, well nourished, in no apparent distress Skin:  no significant moles, warts, or growths Head:  no masses, lesions, or tenderness Eyes:  pupils equal and round, sclera  anicteric without injection Ears:  canals without lesions, TMs shiny without retraction, no obvious effusion, no erythema Nose:  nares patent, septum midline, mucosa normal Throat/Pharynx:  lips and gingiva without lesion; tongue and uvula midline; non-inflamed pharynx; no exudates or postnasal drainage Lungs:  clear to auscultation, breath sounds equal bilaterally, no respiratory distress Cardio:  regular rate and rhythm, no LE edema or bruits Rectal: Deferred GI: BS+, S, NT, ND, no masses or organomegaly Musculoskeletal:  symmetrical muscle groups noted without atrophy or deformity Neuro:  gait normal; deep tendon reflexes normal and symmetric Psych: well oriented with normal range of affect and appropriate judgment/insight  Assessment and Plan  Well adult exam  Type 2 diabetes mellitus with hyperglycemia, with long-term current use of insulin (HCC) - Plan: Comprehensive metabolic panel, Lipid panel, Hemoglobin A1c, Microalbumin / creatinine urine ratio  Encounter for hepatitis C screening test for low risk patient - Plan: Hepatitis C antibody  Depression, major, single episode, complete remission ([place]), Chronic  Neuropathy associated with cancer ([place]), Chronic - Plan: amitriptyline (ELAVIL) 25 MG tablet  Fatigue, unspecified type  Insomnia, unspecified type - Plan: amitriptyline (ELAVIL) 25 MG tablet  Aortic atherosclerosis (HCC) - Plan: atorvastatin (LIPITOR) 40 MG tablet   Well 75 y.o. male. Counseled on diet and exercise. Other orders as above. Chronic, uncontrolled, start Elavil 25 mg nightly.  I think this can help with both neuropathy and insomnia.  If he does start taking it, we will follow-up in 6 weeks. Restart Lipitor for diabetes and also aortic atherosclerosis. Follow up in 6 mo for DM visit.  The patient voiced understanding and agreement to the plan.  [place], DO 09/10/20 12:08 PM

## 2020-09-10 NOTE — Patient Instructions (Addendum)
The new Shingrix vaccine (for shingles) is a 2 shot series. It can make people feel low energy, achy and almost like they have the flu for 48 hours after injection. Please plan accordingly when deciding on when to get this shot. Call our office for a nurse visit appointment to get this. The second shot of the series is less severe regarding the side effects, but it still lasts 48 hours.   Give [place] 2-3 business days to get the results of your labs back.   Keep the diet clean and stay active.  Let us know if you need anything.

## 2020-09-11 LAB — HEPATITIS C ANTIBODY
Hepatitis C Ab: NONREACTIVE
SIGNAL TO CUT-OFF: 0 (ref <1.00)

## 2020-09-12 LAB — MICROALBUMIN / CREATININE URINE RATIO
Creatinine,U: 167.4 mg/dL
Microalb Creat Ratio: 1.3 mg/g (ref 0.0–30.0)
Microalb, Ur: 2.2 mg/dL — ABNORMAL HIGH (ref 0.0–1.9)

## 2020-10-01 ENCOUNTER — Other Ambulatory Visit: Payer: Self-pay

## 2020-10-01 ENCOUNTER — Other Ambulatory Visit (INDEPENDENT_AMBULATORY_CARE_PROVIDER_SITE_OTHER): Payer: Medicare Other

## 2020-10-01 DIAGNOSIS — E785 Hyperlipidemia, unspecified: Secondary | ICD-10-CM

## 2020-10-01 LAB — LIPID PANEL
Cholesterol: 79 mg/dL (ref 0–200)
HDL: 27.7 mg/dL — ABNORMAL LOW (ref >39.00)
LDL Cholesterol: 18 mg/dL (ref 0–99)
NonHDL: 51.74
Total CHOL/HDL Ratio: 3
Triglycerides: 170 mg/dL — ABNORMAL HIGH (ref 0.0–149.0)
VLDL: 34 mg/dL (ref 0.0–40.0)

## 2020-10-16 ENCOUNTER — Other Ambulatory Visit: Payer: Self-pay | Admitting: Family Medicine

## 2020-10-20 ENCOUNTER — Other Ambulatory Visit: Payer: Self-pay

## 2020-10-20 ENCOUNTER — Ambulatory Visit (INDEPENDENT_AMBULATORY_CARE_PROVIDER_SITE_OTHER): Payer: Medicare Other | Admitting: Family Medicine

## 2020-10-20 ENCOUNTER — Encounter: Payer: Self-pay | Admitting: Family Medicine

## 2020-10-20 VITALS — BP 132/70 | HR 93 | Temp 98.4°F | Ht 72.0 in | Wt 240.5 lb

## 2020-10-20 DIAGNOSIS — S80261A Insect bite (nonvenomous), right knee, initial encounter: Secondary | ICD-10-CM

## 2020-10-20 DIAGNOSIS — M7989 Other specified soft tissue disorders: Secondary | ICD-10-CM | POA: Insufficient documentation

## 2020-10-20 DIAGNOSIS — W57XXXA Bitten or stung by nonvenomous insect and other nonvenomous arthropods, initial encounter: Secondary | ICD-10-CM

## 2020-10-20 DIAGNOSIS — B356 Tinea cruris: Secondary | ICD-10-CM

## 2020-10-20 DIAGNOSIS — R42 Dizziness and giddiness: Secondary | ICD-10-CM | POA: Insufficient documentation

## 2020-10-20 DIAGNOSIS — M76891 Other specified enthesopathies of right lower limb, excluding foot: Secondary | ICD-10-CM | POA: Insufficient documentation

## 2020-10-20 DIAGNOSIS — I700 Atherosclerosis of aorta: Secondary | ICD-10-CM

## 2020-10-20 MED ORDER — KETOCONAZOLE 2 % EX CREA: 1.0000 "application " | TOPICAL_CREAM | Freq: Every day | CUTANEOUS | 0 refills | Status: AC

## 2020-10-20 MED ORDER — PRAVASTATIN SODIUM 10 MG PO TABS: 10.0000 mg | ORAL_TABLET | Freq: Every day | ORAL | 2 refills | Status: DC

## 2020-10-20 NOTE — Progress Notes (Signed)
Chief Complaint  Patient presents with   Follow-up    Subjective: Patient is a 75 y.o. male here for a myriad of issues.  LE edema Patient has a history of bilateral lower extremity edema.  Liver and kidney testing was unremarkable. He would like to make sure he is not having any heart issues.  He does not exercise routinely due to neuropathy though he reports that is improving.  Diet could be better.  He does not have compression stockings he wears routinely.  Vertigo The patient deals with vertigo from time to time.  He is wondering if there is anything he can do.  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  He tries to stay hydrated.  Skin lesion On the back of his right knee, he noticed a dark spot.  It does not hurt or itch.  There is nothing draining from it.  His wife noticed it.  He has not tried anything on it so far.  No new topicals.  Neck pain The patient returns right hip over the last few months.  No specific injury or change in activity.  It hurts when he tries to put on his shoes or lift his right thigh.  He does not have any bruising, redness, or swelling.  Has not tried anything at home so far.  No bulges in his groin region.  Past Medical History:  Diagnosis Date   BPH (benign prostatic hyperplasia)    CTS (carpal tunnel syndrome)    Depression    Diabetes mellitus    Essential hypertension    GERD (gastroesophageal reflux disease)    History of breast cancer    Hypercholesteremia    Neuropathy     Objective: BP 132/70   Pulse 93   Temp 98.4 F (36.9 C) (Oral)   Ht 6' (1.829 m)   Wt 240 lb 8 oz (109.1 kg)   SpO2 97%   BMI 32.62 kg/m  General: Awake, appears stated age Heart: RRR, 2+ pitting edema tapering at prox 1/3 of tibia b/l Lungs: CTAB, no rales, wheezes or rhonchi. No accessory muscle use Skin: See below MSK: Tenderness to palpation over the right hip flexor region, positive Stinchfield sign, negative logroll GU: There is no  hernia on the right Psych: Age appropriate judgment and insight, normal affect and mood   R popliteal region  Assessment and Plan: Localized swelling of both lower extremities - Plan: ECHOCARDIOGRAM COMPLETE  Tick bite of right knee, initial encounter  Vertigo  Tendinitis of right hip flexor  Jock itch - Plan: ketoconazole (NIZORAL) 2 % cream  Aortic atherosclerosis (HCC) - Plan: pravastatin (PRAVACHOL) 10 MG tablet  Check echo.  Counseled on exercise.  Elevate legs.  Consider compression stockings.  Mind salt intake. Tick removed today with tweezers.  No target lesion, will avoid prophylaxis as this does not look like a deer tick. Epley maneuvers provided.  Vestibular rehab if no improvement. Home stretches and exercises provided.  If no improvement, will try physical therapy.  Could consider sports medicine referral. Daily ketoconazole cream for 2 weeks. He failed Lipitor, will try hydrophilic pravastatin at a low dose.  Stay hydrated.  Counseled on diet and exercise. Follow-up in 1 month to recheck. The patient voiced understanding and agreement to the plan.  I spent around 48 minutes with the patient and reviewing his chart in the same day of the visit.  Nicholas Paul [place], DO 10/20/20  4:34 PM     

## 2020-10-20 NOTE — Patient Instructions (Addendum)
Keep the diet clean and stay active.  YouTube is a good option for the Epley maneuvers. If we aren't improving, let me know and I will set you up with the vestibular rehab team.   Ice/cold pack over area for 10-15 min twice daily.  Heat (pad or rice pillow in microwave) over affected area, 10-15 minutes twice daily.   OK to take Tylenol 1000 mg (2 extra strength tabs) or 975 mg (3 regular strength tabs) every 6 hours as needed.  For the swelling in your lower extremities, be sure to elevate your legs when able, mind the salt intake, stay physically active and consider wearing compression stockings.  Let us know if you need anything.  Hip Exercises It is normal to feel mild stretching, pulling, tightness, or discomfort as you do these exercises, but you should stop right away if you feel sudden pain or your pain gets worse.   STRETCHING AND RANGE OF MOTION EXERCISES These exercises warm up your muscles and joints and improve the movement and flexibility of your hip. These exercises also help to relieve pain, numbness, and tingling. Exercise A: Hamstrings, Supine  Lie on your back. Loop a belt or towel over the ball of your left / right foot. The ball of your foot is on the walking surface, right under your toes. Straighten your left / right knee and slowly pull on the belt to raise your leg. Do not let your left / right knee bend while you do this. Keep your other leg flat on the floor. Raise the left / right leg until you feel a gentle stretch behind your left / right knee or thigh. Hold this position for 30 seconds. Slowly return your leg to the starting position. Repeat2 times. Complete this stretch 3 times per week. Exercise B: Hip Rotators  Lie on your back on a firm surface. Hold your left / right knee with your left / right hand. Hold your ankle with your other hand. Gently pull your left / right knee and rotate your lower leg toward your other shoulder. Pull until you feel a  stretch in your buttocks. Keep your hips and shoulders firmly planted while you do this stretch. Hold this position for 30 seconds. Repeat 2 times. Complete this stretch 3 times per week. Exercise C: V-Sit (Hamstrings and Adductors)  Sit on the floor with your legs extended in a large "V" shape. Keep your knees straight during this exercise. Start with your head and chest upright, then bend at your waist to reach for your left foot (position A). You should feel a stretch in your right inner thigh. Hold this position for 30 seconds. Then slowly return to the upright position. Bend at your waist to reach forward (position B). You should feel a stretch behind both of your thighs and knees. Hold this position for 30 seconds. Then slowly return to the upright position. Bend at your waist to reach for your right foot (position C). You should feel a stretch in your left inner thigh. Hold this position for 30 seconds. Then slowly return to the upright position. Repeat A, B, and C 2 times each. Complete this stretch 3 times per week. Exercise D: Lunge (Hip Flexors)  Place your left / right knee on the floor and bend your other knee so that is directly over your ankle. You should be half-kneeling. Keep good posture with your head over your shoulders. Tighten your buttocks to point your tailbone downward. This helps your back  to keep from arching too much. You should feel a gentle stretch in the front of your left / right thigh and hip. If you do not feel any resistance, slightly slide your other foot forward and then slowly lunge forward so your knee once again lines up over your ankle. Make sure your tailbone continues to point downward. Hold this position for 30 seconds. Repeat 2 times. Complete this stretch 3 times per week.  STRENGTHENING EXERCISES These exercises build strength and endurance in your hip. Endurance is the ability to use your muscles for a long time, even after they get  tired. Exercise E: Bridge (Hip Extensors)  Lie on your back on a firm surface with your knees bent and your feet flat on the floor. Tighten your buttocks muscles and lift your bottom off the floor until the trunk of your body is level with your thighs. Do not arch your back. You should feel the muscles working in your buttocks and the back of your thighs. If you do not feel these muscles, slide your feet 1-2 inches (2.5-5 cm) farther away from your buttocks. Hold this position for 3 seconds. Slowly lower your hips to the starting position. Repeat for a total of 10 repetitions. Let your muscles relax completely between repetitions. If this exercise is too easy, try doing it with your arms crossed over your chest. Repeat 2 times. Complete this exercise 3 times per week. Exercise F: Straight Leg Raises - Hip Abductors  Lie on your side with your left / right leg in the top position. Lie so your head, shoulder, knee, and hip line up with each other. You may bend your bottom knee to help you balance. Roll your hips slightly forward, so your hips are stacked directly over each other and your left / right knee is facing forward. Leading with your heel, lift your top leg 4-6 inches (10-15 cm). You should feel the muscles in your outer hip lifting. Do not let your foot drift forward. Do not let your knee roll toward the ceiling. Hold this position for 1 second. Slowly return to the starting position. Let your muscles relax completely between repetitions. Repeat for a total of 10 repetitions.  Repeat 2 times. Complete this exercise 3 times per week. Exercise G: Straight Leg Raises - Hip Adductors  Lie on your side with your left / right leg in the bottom position. Lie so your head, shoulder, knee, and hip line up. You may place your upper foot in front to help you balance. Roll your hips slightly forward, so your hips are stacked directly over each other and your left / right knee is facing  forward. Tense the muscles in your inner thigh and lift your bottom leg 4-6 inches (10-15 cm). Hold this position for 1 second. Slowly return to the starting position. Let your muscles relax completely between repetitions. Repeat for a total of 10 repetitions. Repeat 2 times. Complete this exercise 3 times per week. Exercise H: Straight Leg Raises - Quadriceps  Lie on your back with your left / right leg extended and your other knee bent. Tense the muscles in the front of your left / right thigh. When you do this, you should see your kneecap slide up or see increased dimpling just above your knee. Tighten these muscles even more and raise your leg 4-6 inches (10-15 cm) off the floor. Hold this position for 3 seconds. Keep these muscles tense as you lower your leg. Relax the muscles slowly  and completely between repetitions. Repeat for a total of 10 repetitions. Repeat 2 times. Complete this exercise 3 times per week. Exercise I: Hip Abductors, Standing Tie one end of a rubber exercise band or tubing to a secure surface, such as a table or pole. Loop the other end of the band or tubing around your left / right ankle. Keeping your ankle with the band or tubing directly opposite of the secured end, step away until there is tension in the tubing or band. Hold onto a chair as needed for balance. Lift your left / right leg out to your side. While you do this: Keep your back upright. Keep your shoulders over your hips. Keep your toes pointing forward. Make sure to use your hip muscles to lift your leg. Do not "throw" your leg or tip your body to lift your leg. Hold this position for 1 second. Slowly return to the starting position. Repeat for a total of 10 repetitions. Repeat 2 times. Complete this exercise 3 times per week. Exercise J: Squats (Quadriceps) Stand in a door frame so your feet and knees are in line with the frame. You may place your hands on the frame for balance. Slowly bend your  knees and lower your hips like you are going to sit in a chair. Keep your lower legs in a straight-up-and-down position. Do not let your hips go lower than your knees. Do not bend your knees lower than told by your health care provider. If your hip pain increases, do not bend as low. Hold this position for 1 second. Slowly push with your legs to return to standing. Do not use your hands to pull yourself to standing. Repeat for a total of 10 repetitions. Repeat 2 times. Complete this exercise 3 times per week. Make sure you discuss any questions you have with your health care provider. Document Released: 04/16/2005 Document Revised: 12/22/2015 Document Reviewed: 03/24/2015 Elsevier Interactive Patient Education  [employer].

## 2020-10-22 ENCOUNTER — Ambulatory Visit (HOSPITAL_BASED_OUTPATIENT_CLINIC_OR_DEPARTMENT_OTHER): Payer: Medicare Other

## 2020-11-05 ENCOUNTER — Other Ambulatory Visit (HOSPITAL_BASED_OUTPATIENT_CLINIC_OR_DEPARTMENT_OTHER): Payer: Self-pay

## 2020-11-05 ENCOUNTER — Other Ambulatory Visit: Payer: Self-pay | Admitting: Family Medicine

## 2020-11-05 MED ORDER — METHYLPREDNISOLONE 4 MG PO TBPK
ORAL_TABLET | ORAL | 0 refills | Status: DC
  Filled 2020-11-05: qty 21, 6d supply, fill #0

## 2020-11-14 ENCOUNTER — Ambulatory Visit (HOSPITAL_BASED_OUTPATIENT_CLINIC_OR_DEPARTMENT_OTHER)
Admission: RE | Admit: 2020-11-14 | Discharge: 2020-11-14 | Disposition: A | Discharge status: Home / Self Care | Payer: Medicare Other | Source: Ambulatory Visit | Attending: Family Medicine | Admitting: Family Medicine

## 2020-11-14 ENCOUNTER — Other Ambulatory Visit: Payer: Self-pay

## 2020-11-14 DIAGNOSIS — E119 Type 2 diabetes mellitus without complications: Secondary | ICD-10-CM | POA: Insufficient documentation

## 2020-11-14 DIAGNOSIS — I10 Essential (primary) hypertension: Secondary | ICD-10-CM | POA: Diagnosis not present

## 2020-11-14 DIAGNOSIS — M7989 Other specified soft tissue disorders: Secondary | ICD-10-CM | POA: Diagnosis not present

## 2020-11-14 DIAGNOSIS — R600 Localized edema: Secondary | ICD-10-CM

## 2020-11-14 DIAGNOSIS — E785 Hyperlipidemia, unspecified: Secondary | ICD-10-CM | POA: Diagnosis not present

## 2020-11-14 NOTE — Progress Notes (Signed)
*  PRELIMINARY RESULTS* Echocardiogram 2D Echocardiogram has been performed.  Luisa Hart RDCS 11/14/2020, 1:46 PM

## 2020-11-15 LAB — ECHOCARDIOGRAM COMPLETE
AR max vel: 2.99 cm2
AV Area VTI: 2.71 cm2
AV Area mean vel: 2.95 cm2
AV Mean grad: 6 mmHg
AV Peak grad: 11.7 mmHg
Ao pk vel: 1.71 m/s
Area-P 1/2: 3 cm2
Calc EF: 50 %
S' Lateral: 2.33 cm
Single Plane A2C EF: 45.7 %
Single Plane A4C EF: 55.3 %

## 2020-11-21 ENCOUNTER — Other Ambulatory Visit: Payer: Self-pay

## 2020-11-21 ENCOUNTER — Encounter: Payer: Self-pay | Admitting: Family Medicine

## 2020-11-21 ENCOUNTER — Ambulatory Visit: Payer: Medicare Other | Admitting: Family Medicine

## 2020-11-21 VITALS — BP 120/72 | HR 87 | Temp 98.3°F | Ht 72.0 in | Wt 241.5 lb

## 2020-11-21 DIAGNOSIS — E785 Hyperlipidemia, unspecified: Secondary | ICD-10-CM | POA: Diagnosis not present

## 2020-11-21 DIAGNOSIS — M7989 Other specified soft tissue disorders: Secondary | ICD-10-CM

## 2020-11-21 NOTE — Patient Instructions (Addendum)
Let's stay on the pravastatin.  Stay active and hydrated.  Aim to do some physical exertion for 150 minutes per week. This is typically divided into 5 days per week, 30 minutes per day. The activity should be enough to get your heart rate up. Anything is better than nothing if you have time constraints.  Hold your Norvasc for the next 2 weeks and send me a message with how things are going with your swelling.   For the swelling in your lower extremities, be sure to elevate your legs when able, mind the salt intake, stay physically active and consider wearing compression stockings.  Let us know if you need anything.

## 2020-11-21 NOTE — Progress Notes (Signed)
Chief Complaint  Patient presents with   Follow-up    Subjective: Hyperlipidemia Patient presents for Hyperlipidemia follow up. Currently taking pravastatin 10 mg/d and compliance with treatment thus far has been good. He denies myalgias. He is usually sometimes adhering to a healthy diet. Exercise: none The patient is not known to have coexisting coronary artery disease. No CP or SOB.   LE edema Still having LE edema. Echo showed grade 1 diastolic dysfunction. He is on Norvasc 5 mg/d. No exercise, does not wear comp stockings or elevate legs. Diet could be better w salt.   R hip: better from last time, has been doing stretches/exercises.   Area from tick bite is healing well. No bulls-eye rash.   Past Medical History:  Diagnosis Date   BPH (benign prostatic hyperplasia)    CTS (carpal tunnel syndrome)    Depression    Diabetes mellitus    Essential hypertension    GERD (gastroesophageal reflux disease)    History of breast cancer    Hypercholesteremia    Neuropathy     Objective: BP 120/72   Pulse 87   Temp 98.3 F (36.8 C) (Oral)   Ht 6' (1.829 m)   Wt 241 lb 8 oz (109.5 kg)   SpO2 97%   BMI 32.75 kg/m  General: Awake, appears stated age HEENT: MMM Heart: RRR, 2+ pitting b/l LE edema tapering at knees, no bruits Lungs: CTAB, no rales, wheezes or rhonchi. No accessory muscle use Psych: Age appropriate judgment and insight, normal affect and mood  Assessment and Plan: Hyperlipidemia, unspecified hyperlipidemia type  Localized swelling of both lower extremities  Chronic, stable. Cont pravastatin 10 mg/d. Counseled on diet/exercise. Chronic, unstable. Hld Norvac 5 mg/d for next 2 weeks and send message. Compression stockings, mind salt intake, elevate legs, exercise. Would try 2 mo of that if not Norvasc, trial Lasix after that. Otherwise f/u in 6 mo for med ck. The patient voiced understanding and agreement to the plan.  [place], DO 11/21/20   3:27 PM

## 2020-12-15 ENCOUNTER — Other Ambulatory Visit: Payer: Self-pay | Admitting: Family Medicine

## 2020-12-15 DIAGNOSIS — I700 Atherosclerosis of aorta: Secondary | ICD-10-CM

## 2020-12-18 ENCOUNTER — Other Ambulatory Visit: Payer: Self-pay | Admitting: Family Medicine

## 2020-12-18 DIAGNOSIS — I700 Atherosclerosis of aorta: Secondary | ICD-10-CM

## 2020-12-30 ENCOUNTER — Ambulatory Visit (INDEPENDENT_AMBULATORY_CARE_PROVIDER_SITE_OTHER): Payer: Medicare Other

## 2020-12-30 VITALS — Ht 72.0 in | Wt 241.0 lb

## 2020-12-30 DIAGNOSIS — Z0000 Encounter for general adult medical examination without abnormal findings: Secondary | ICD-10-CM

## 2020-12-30 NOTE — Patient Instructions (Signed)
John [name] , Thank you for taking time to complete your Medicare Wellness Visit. I appreciate your ongoing commitment to your health goals. Please review the following plan we discussed and let me know if I can assist you in the future.   Screening recommendations/referrals: Colonoscopy: Completed 2019-Due 2029 Recommended yearly ophthalmology/optometry visit for glaucoma screening and checkup Recommended yearly dental visit for hygiene and checkup  Vaccinations: Influenza vaccine: Due-May obtain vaccine in our office or your local pharmacy. Pneumococcal vaccine: Up to date Tdap vaccine: Up to date-Due-07/29/2021 Shingles vaccine: Discuss with pharmacy   Covid-19: Booster available at the pharmacy  Advanced directives: Please bring a copy for your chart  Conditions/risks identified: See problem list  Next appointment: Follow up in one year for your annual wellness visit.   Preventive Care 29 Years and Older, Male Preventive care refers to lifestyle choices and visits with your health care provider that can promote health and wellness. What does preventive care include? A yearly physical exam. This is also called an annual well check. Dental exams once or twice a year. Routine eye exams. Ask your health care provider how often you should have your eyes checked. Personal lifestyle choices, including: Daily care of your teeth and gums. Regular physical activity. Eating a healthy diet. Avoiding tobacco and drug use. Limiting alcohol use. Practicing safe sex. Taking low doses of aspirin every day. Taking vitamin and mineral supplements as recommended by your health care provider. What happens during an annual well check? The services and screenings done by your health care provider during your annual well check will depend on your age, overall health, lifestyle risk factors, and family history of disease. Counseling  Your health care provider may ask you questions about your: Alcohol  use. Tobacco use. Drug use. Emotional well-being. Home and relationship well-being. Sexual activity. Eating habits. History of falls. Memory and ability to understand (cognition). Work and work Statistician. Screening  You may have the following tests or measurements: Height, weight, and BMI. Blood pressure. Lipid and cholesterol levels. These may be checked every 5 years, or more frequently if you are over 70 years old. Skin check. Lung cancer screening. You may have this screening every year starting at age 45 if you have a 30-pack-year history of smoking and currently smoke or have quit within the past 15 years. Fecal occult blood test (FOBT) of the stool. You may have this test every year starting at age 59. Flexible sigmoidoscopy or colonoscopy. You may have a sigmoidoscopy every 5 years or a colonoscopy every 10 years starting at age 45. Prostate cancer screening. Recommendations will vary depending on your family history and other risks. Hepatitis C blood test. Hepatitis B blood test. Sexually transmitted disease (STD) testing. Diabetes screening. This is done by checking your blood sugar (glucose) after you have not eaten for a while (fasting). You may have this done every 1-3 years. Abdominal aortic aneurysm (AAA) screening. You may need this if you are a current or former smoker. Osteoporosis. You may be screened starting at age 17 if you are at high risk. Talk with your health care provider about your test results, treatment options, and if necessary, the need for more tests. Vaccines  Your health care provider may recommend certain vaccines, such as: Influenza vaccine. This is recommended every year. Tetanus, diphtheria, and acellular pertussis (Tdap, Td) vaccine. You may need a Td booster every 10 years. Zoster vaccine. You may need this after age 30. Pneumococcal 13-valent conjugate (PCV13) vaccine. One dose  is recommended after age 9. Pneumococcal polysaccharide  (PPSV23) vaccine. One dose is recommended after age 89. Talk to your health care provider about which screenings and vaccines you need and how often you need them. This information is not intended to replace advice given to you by your health care provider. Make sure you discuss any questions you have with your health care provider. Document Released: 04/25/2015 Document Revised: 12/17/2015 Document Reviewed: 01/28/2015 Elsevier Interactive Patient Education  2017 [employer] Prevention in the Home Falls can cause injuries. They can happen to people of all ages. There are many things you can do to make your home safe and to help prevent falls. What can I do on the outside of my home? Regularly fix the edges of walkways and driveways and fix any cracks. Remove anything that might make you trip as you walk through a door, such as a raised step or threshold. Trim any bushes or trees on the path to your home. Use bright outdoor lighting. Clear any walking paths of anything that might make someone trip, such as rocks or tools. Regularly check to see if handrails are loose or broken. Make sure that both sides of any steps have handrails. Any raised decks and porches should have guardrails on the edges. Have any leaves, snow, or ice cleared regularly. Use sand or salt on walking paths during winter. Clean up any spills in your garage right away. This includes oil or grease spills. What can I do in the bathroom? Use night lights. Install grab bars by the toilet and in the tub and shower. Do not use towel bars as grab bars. Use non-skid mats or decals in the tub or shower. If you need to sit down in the shower, use a plastic, non-slip stool. Keep the floor dry. Clean up any water that spills on the floor as soon as it happens. Remove soap buildup in the tub or shower regularly. Attach bath mats securely with double-sided non-slip rug tape. Do not have throw rugs and other things on the  floor that can make you trip. What can I do in the bedroom? Use night lights. Make sure that you have a light by your bed that is easy to reach. Do not use any sheets or blankets that are too big for your bed. They should not hang down onto the floor. Have a firm chair that has side arms. You can use this for support while you get dressed. Do not have throw rugs and other things on the floor that can make you trip. What can I do in the kitchen? Clean up any spills right away. Avoid walking on wet floors. Keep items that you use a lot in easy-to-reach places. If you need to reach something above you, use a strong step stool that has a grab bar. Keep electrical cords out of the way. Do not use floor polish or wax that makes floors slippery. If you must use wax, use non-skid floor wax. Do not have throw rugs and other things on the floor that can make you trip. What can I do with my stairs? Do not leave any items on the stairs. Make sure that there are handrails on both sides of the stairs and use them. Fix handrails that are broken or loose. Make sure that handrails are as long as the stairways. Check any carpeting to make sure that it is firmly attached to the stairs. Fix any carpet that is loose or worn. Avoid  having throw rugs at the top or bottom of the stairs. If you do have throw rugs, attach them to the floor with carpet tape. Make sure that you have a light switch at the top of the stairs and the bottom of the stairs. If you do not have them, ask someone to add them for you. What else can I do to help prevent falls? Wear shoes that: Do not have high heels. Have rubber bottoms. Are comfortable and fit you well. Are closed at the toe. Do not wear sandals. If you use a stepladder: Make sure that it is fully opened. Do not climb a closed stepladder. Make sure that both sides of the stepladder are locked into place. Ask someone to hold it for you, if possible. Clearly mark and make  sure that you can see: Any grab bars or handrails. First and last steps. Where the edge of each step is. Use tools that help you move around (mobility aids) if they are needed. These include: Canes. Walkers. Scooters. Crutches. Turn on the lights when you go into a dark area. Replace any light bulbs as soon as they burn out. Set up your furniture so you have a clear path. Avoid moving your furniture around. If any of your floors are uneven, fix them. If there are any pets around you, be aware of where they are. Review your medicines with your doctor. Some medicines can make you feel dizzy. This can increase your chance of falling. Ask your doctor what other things that you can do to help prevent falls. This information is not intended to replace advice given to you by your health care provider. Make sure you discuss any questions you have with your health care provider. Document Released: 01/23/2009 Document Revised: 09/04/2015 Document Reviewed: 05/03/2014 Elsevier Interactive Patient Education  2017 [employer].

## 2020-12-30 NOTE — Progress Notes (Signed)
Subjective:   John [name] is a 75 y.o. male who presents for an Initial Medicare Annual Wellness Visit.  I connected with Gershom today by telephone and verified that I am speaking with the correct person using two identifiers. Location patient: home Location provider: work Persons participating in the virtual visit: patient, Marine scientist.    I discussed the limitations, risks, security and privacy concerns of performing an evaluation and management service by telephone and the availability of in person appointments. I also discussed with the patient that there may be a patient responsible charge related to this service. The patient expressed understanding and verbally consented to this telephonic visit.    Interactive audio and video telecommunications were attempted between this provider and patient, however failed, due to patient having technical difficulties OR patient did not have access to video capability.  We continued and completed visit with audio only.  Some vital signs may be absent or patient reported.   Time Spent with patient on telephone encounter: 20 minutes   Review of Systems     Cardiac Risk Factors include: advanced age (>57men, >65 women);male gender;obesity (BMI >30kg/m2);diabetes mellitus;dyslipidemia;hypertension;sedentary lifestyle     Objective:    Today's Vitals   12/30/20 1339  Weight: 241 lb (109.3 kg)  Height: 6' (1.829 m)   Body mass index is 32.69 kg/m.  Advanced Directives 12/30/2020 10/26/2019 10/26/2019 10/26/2019  Does Patient Have a Medical Advance Directive? Yes Yes Yes No  Type of Paramedic of [place];Living will [place];Living will [place];Living will -  Does patient want to make changes to medical advance directive? - No - Patient declined - -  Copy of [place] in Chart? No - copy requested No - copy requested - -    Current Medications  (verified) Outpatient Encounter Medications as of 12/30/2020  Medication Sig   acetaminophen (TYLENOL) 650 MG CR tablet Take 1,300 mg by mouth every 8 (eight) hours as needed for pain.   amLODipine (NORVASC) 5 MG tablet TAKE 1 TABLET(5 MG) BY MOUTH DAILY   Ascorbic Acid (VITAMIN C) 1000 MG tablet Take 1,000 mg by mouth in the morning and at bedtime.   aspirin 325 MG EC tablet Take 325 mg by mouth daily.   COVID-19 mRNA vaccine, Pfizer, 30 MCG/0.3ML injection INJECT AS DIRECTED   insulin glargine (LANTUS) 100 UNIT/ML injection Inject 0.65 mLs (65 Units total) into the skin daily. (Patient taking differently: Inject 80 Units into the skin daily.)   Multiple Vitamin (MULTIVITAMIN) tablet Take 1 tablet by mouth daily.   omeprazole (PRILOSEC) 40 MG capsule TAKE 1 CAPSULE(40 MG) BY MOUTH DAILY   pravastatin (PRAVACHOL) 10 MG tablet TAKE 1 TABLET(10 MG) BY MOUTH DAILY   tamoxifen (NOLVADEX) 20 MG tablet Take 20 mg by mouth daily.   Vitamin D, Ergocalciferol, (DRISDOL) 1.25 MG (50000 UNIT) CAPS capsule TAKE 1 CAPSULE BY MOUTH EVERY 7 DAYS   zinc sulfate 220 (50 Zn) MG capsule Take 1 capsule (220 mg total) by mouth daily.   No facility-administered encounter medications on file as of 12/30/2020.    Allergies (verified) Ramipril, Other, Sertraline, and Adhesive [tape]   History: Past Medical History:  Diagnosis Date   BPH (benign prostatic hyperplasia)    CTS (carpal tunnel syndrome)    Depression    Diabetes mellitus    Essential hypertension    GERD (gastroesophageal reflux disease)    History of breast cancer    Hypercholesteremia  Neuropathy    Past Surgical History:  Procedure Laterality Date   CATARACT EXTRACTION, BILATERAL  01/18/2017   LITHOTRIPSY     MASTECTOMY Right 05/02/2018   TONSILLECTOMY AND ADENOIDECTOMY     Family History  Problem Relation Age of Onset   Lymphoma Mother    Cancer Mother    Diabetes Father    Stroke Father    Ovarian cancer Sister    Cancer  Sister    Cancer Paternal Grandmother    Social History   Socioeconomic History   Marital status: Married    Spouse name: Not on file   Number of children: Not on file   Years of education: Not on file   Highest education level: Not on file  Occupational History   Not on file  Tobacco Use   Smoking status: Never   Smokeless tobacco: Never  Substance and Sexual Activity   Alcohol use: No   Drug use: No   Sexual activity: Not Currently    Partners: Female  Other Topics Concern   Not on file  Social History Narrative   Not on file   Social Determinants of Health   Financial Resource Strain: Low Risk    Difficulty of Paying Living Expenses: Not hard at all  Food Insecurity: No Food Insecurity   Worried About Charity fundraiser in the Last Year: Never true   [place] in the Last Year: Never true  Transportation Needs: No Transportation Needs   Lack of Transportation (Medical): No   Lack of Transportation (Non-Medical): No  Physical Activity: Inactive   Days of Exercise per Week: 0 days   Minutes of Exercise per Session: 0 min  Stress: Not on file  Social Connections: Not on file    Tobacco Counseling Counseling given: Not Answered   Clinical Intake:  Pre-visit preparation completed: Yes  Pain : No/denies pain     BMI - recorded: 32.69 Nutritional Status: BMI > 30  Obese Nutritional Risks: None Diabetes: Yes CBG done?: No Did pt. bring in CBG monitor from home?: No (phone visit)  How often do you need to have someone help you when you read instructions, pamphlets, or other written materials from your doctor or pharmacy?: 1 - Never  Diabetes:  Is the patient diabetic?  Yes  If diabetic, was a CBG obtained today?  No  Did the patient bring in their glucometer from home?  No phone visit How often do you monitor your CBG's?. occasionally  Financial Strains and Diabetes Management:  Are you having any financial strains with the device, your  supplies or your medication? No .  Does the patient want to be seen by Chronic Care Management for management of their diabetes?  No  Would the patient like to be referred to a Nutritionist or for Diabetic Management?  No   Diabetic Exams:  Diabetic Eye Exam: Completed within the last year per patient..   Diabetic Foot Exam:  Pt has been advised about the importance in completing this exam. To be completed by PCP.    Interpreter Needed?: No  Information entered by :: Caroleen Hamman LPN   Activities of Daily Living In your present state of health, do you have any difficulty performing the following activities: 12/30/2020 11/21/2020  Hearing? Y N  Comment hearing aids -  Vision? N N  Difficulty concentrating or making decisions? N N  Walking or climbing stairs? N N  Dressing or bathing? N N  Doing errands,  shopping? N N  Preparing Food and eating ? N -  Using the Toilet? N -  In the past six months, have you accidently leaked urine? N -  Do you have problems with loss of bowel control? N -  Managing your Medications? N -  Managing your Finances? N -  Housekeeping or managing your Housekeeping? N -  Some recent data might be hidden    Patient Care Team: Shelda Pal, DO as PCP - General (Family Medicine)  Indicate any recent Medical Services you may have received from other than [facility] providers in the past year (date may be approximate).     Assessment:   This is a routine wellness examination for [place].  Hearing/Vision screen Hearing Screening - Comments:: Bilateral hearing aids Vision Screening - Comments:: Last eye exam-within the last year Glasses for reading &amp; driving  Dietary issues and exercise activities discussed: Current Exercise Habits: The patient does not participate in regular exercise at present   Goals Addressed             This Visit's Progress    Patient Stated       Increase activity as tolerated       Depression Screen PHQ  2/9 Scores 12/30/2020 09/10/2020 12/21/2019  PHQ - 2 Score 0 0 0  PHQ- 9 Score - - 0    Fall Risk Fall Risk  12/30/2020 09/10/2020  Falls in the past year? 0 0  Number falls in past yr: 0 0  Injury with Fall? 0 0  Risk for fall due to : - No Fall Risks  Follow up Falls prevention discussed Falls evaluation completed    [place]:  Any stairs in or around the home? Yes  If so, are there any without handrails? No  Home free of loose throw rugs in walkways, pet beds, electrical cords, etc? No  Adequate lighting in your home to reduce risk of falls? No   ASSISTIVE DEVICES UTILIZED TO PREVENT FALLS:  Life alert? No  Use of a cane, walker or w/c? No  Grab bars in the bathroom? No  Shower chair or bench in shower? No  Elevated toilet seat or a handicapped toilet? No   TIMED UP AND GO:  Was the test performed? No . Phone visit   Cognitive Function:Normal cognitive status assessed by this Nurse Health Advisor. No abnormalities found.          Immunizations Immunization History  Administered Date(s) Administered   Influenza Split 08/04/2014   Influenza, High Dose Seasonal PF 12/04/2014, 12/17/2015, 12/15/2016, 12/14/2017, 11/27/2018, 11/28/2018, 01/16/2020   Influenza,inj,quad, With Preservative 12/17/2015   PFIZER Comirnaty(Gray Top)Covid-19 Tri-Sucrose Vaccine 02/04/2020   PFIZER(Purple Top)SARS-COV-2 Vaccination 05/10/2019, 06/05/2019, 02/04/2020   Pneumococcal Conjugate-13 07/18/2013   Pneumococcal Polysaccharide-23 09/24/2010, 03/10/2020   Tdap 07/30/2011   Zoster, Live 07/27/2010    TDAP status: Up to date  Flu Vaccine status: Due, Education has been provided regarding the importance of this vaccine. Advised may receive this vaccine at local pharmacy or Health Dept. Aware to provide a copy of the vaccination record if obtained from local pharmacy or Health Dept. Verbalized acceptance and understanding.  Pneumococcal vaccine status: Up to  date  Covid-19 vaccine status: Information provided on how to obtain vaccines. Booster  Qualifies for Shingles Vaccine? Yes   Zostavax completed Yes   Shingrix Completed?: No.    Education has been provided regarding the importance of this vaccine. Patient has been advised to  call insurance company to determine out of pocket expense if they have not yet received this vaccine. Advised may also receive vaccine at local pharmacy or Health Dept. Verbalized acceptance and understanding.  Screening Tests Health Maintenance  Topic Date Due   Zoster Vaccines- Shingrix (1 of 2) Never done   COVID-19 Vaccine (4 - Booster for Pfizer series) 04/28/2020   INFLUENZA VACCINE  11/10/2020   TETANUS/TDAP  07/29/2021   URINE MICROALBUMIN  09/10/2021   COLONOSCOPY (Pts 45-53yrs Insurance coverage will need to be confirmed)  03/27/2028   Hepatitis C Screening  Completed   HPV VACCINES  Aged Out    Health Maintenance  Health Maintenance Due  Topic Date Due   Zoster Vaccines- Shingrix (1 of 2) Never done   COVID-19 Vaccine (4 - Booster for Pfizer series) 04/28/2020   INFLUENZA VACCINE  11/10/2020    Colorectal cancer screening: No longer required.   Lung Cancer Screening: (Low Dose CT Chest recommended if Age 49-80 years, 30 pack-year currently smoking OR have quit w/in 15years.) does not qualify.     Additional Screening:  Hepatitis C Screening: Completed 09/10/2020  Vision Screening: Recommended annual ophthalmology exams for early detection of glaucoma and other disorders of the eye. Is the patient up to date with their annual eye exam?  Yes  Who is the provider or what is the name of the office in which the patient attends annual eye exams? Pt unsure of name   Dental Screening: Recommended annual dental exams for proper oral hygiene  Community Resource Referral / Chronic Care Management: CRR required this visit?  No   CCM required this visit?  No      Plan:     I have personally  reviewed and noted the following in the patient's chart:   Medical and social history Use of alcohol, tobacco or illicit drugs  Current medications and supplements including opioid prescriptions. Patient is not currently taking opioid prescriptions. Functional ability and status Nutritional status Physical activity Advanced directives List of other physicians Hospitalizations, surgeries, and ER visits in previous 12 months Vitals Screenings to include cognitive, depression, and falls Referrals and appointments  In addition, I have reviewed and discussed with patient certain preventive protocols, quality metrics, and best practice recommendations. A written personalized care plan for preventive services as well as general preventive health recommendations were provided to patient.   Due to this being a telephonic visit, the after visit summary with patients personalized plan was offered to patient via mail or my-chart. Patient would like to access on my-chart.   Marta Antu, LPN   1/57/2620  Nurse Health Advisor  Nurse Notes: None

## 2021-01-13 ENCOUNTER — Encounter: Payer: Self-pay | Admitting: Family

## 2021-01-13 ENCOUNTER — Other Ambulatory Visit: Payer: Self-pay

## 2021-01-13 ENCOUNTER — Telehealth (INDEPENDENT_AMBULATORY_CARE_PROVIDER_SITE_OTHER): Payer: Medicare Other | Admitting: Family

## 2021-01-13 VITALS — Ht 72.0 in | Wt 235.0 lb

## 2021-01-13 DIAGNOSIS — J069 Acute upper respiratory infection, unspecified: Secondary | ICD-10-CM | POA: Diagnosis not present

## 2021-01-13 NOTE — Assessment & Plan Note (Signed)
Improving.  Declines and rx for symptoms management.  Advised pt to call if new worsening symptoms or if symptoms do not continue to improve.

## 2021-01-13 NOTE — Progress Notes (Signed)
MyChart Video Visit    Virtual Visit via Video Note   This visit type was conducted due to national recommendations for restrictions regarding the COVID-19 Pandemic (e.g. social distancing) in an effort to limit this patient's exposure and mitigate transmission in our community. This patient is at least at moderate risk for complications without adequate follow up. This format is felt to be most appropriate for this patient at this time. Physical exam was limited by quality of the video and audio technology used for the visit. A nurse was able to get the patient set up on a video visit.  Patient location: Home Patient and provider in visit Provider location: Office  I discussed the limitations of evaluation and management by telemedicine and the availability of in person appointments. The patient expressed understanding and agreed to proceed.  Visit Date: 01/13/2021  Today's healthcare provider: Nance Pear, NP     Subjective:    Patient ID: John [name], male    DOB: 1945/06/11, 75 y.o.   MRN: [id number]  Chief Complaint  Patient presents with   Sinus Problem    Sinus pressure between eyes . Started Sunday afternoon     HPI Patient is in today for a video visit. Patient was unable to connect successfully to video and we transitioned to a phone note.   Sinus Concerns: He complains that on Sunday symptoms started. He has tested for Covid-19 and the results were negative. He is eating and drinking well at this time. Notes that symptoms are improving    Diabetic: He has a history of diabetes. He does not monitor his blood sugar at home.   Past Medical History:  Diagnosis Date   BPH (benign prostatic hyperplasia)    CTS (carpal tunnel syndrome)    Depression    Diabetes mellitus    Essential hypertension    GERD (gastroesophageal reflux disease)    History of breast cancer    Hypercholesteremia    Neuropathy     Past Surgical History:  Procedure  Laterality Date   CATARACT EXTRACTION, BILATERAL  01/18/2017   LITHOTRIPSY     MASTECTOMY Right 05/02/2018   TONSILLECTOMY AND ADENOIDECTOMY      Family History  Problem Relation Age of Onset   Lymphoma Mother    Cancer Mother    Diabetes Father    Stroke Father    Ovarian cancer Sister    Cancer Sister    Cancer Paternal Grandmother     Social History   Socioeconomic History   Marital status: Married    Spouse name: Not on file   Number of children: Not on file   Years of education: Not on file   Highest education level: Not on file  Occupational History   Not on file  Tobacco Use   Smoking status: Never   Smokeless tobacco: Never  Substance and Sexual Activity   Alcohol use: No   Drug use: No   Sexual activity: Not Currently    Partners: Female  Other Topics Concern   Not on file  Social History Narrative   Not on file   Social Determinants of Health   Financial Resource Strain: Low Risk    Difficulty of Paying Living Expenses: Not hard at all  Food Insecurity: No Food Insecurity   Worried About Charity fundraiser in the Last Year: Never true   [place] in the Last Year: Never true  Transportation Needs: No Transportation Needs  Lack of Transportation (Medical): No   Lack of Transportation (Non-Medical): No  Physical Activity: Inactive   Days of Exercise per Week: 0 days   Minutes of Exercise per Session: 0 min  Stress: Not on file  Social Connections: Not on file  Intimate Partner Violence: Not At Risk   Fear of Current or Ex-Partner: No   Emotionally Abused: No   Physically Abused: No   Sexually Abused: No    Outpatient Medications Prior to Visit  Medication Sig Dispense Refill   acetaminophen (TYLENOL) 650 MG CR tablet Take 1,300 mg by mouth every 8 (eight) hours as needed for pain.     amLODipine (NORVASC) 5 MG tablet TAKE 1 TABLET(5 MG) BY MOUTH DAILY 90 tablet 3   Ascorbic Acid (VITAMIN C) 1000 MG tablet Take 1,000 mg by mouth in the  morning and at bedtime.     aspirin 325 MG EC tablet Take 325 mg by mouth daily.     insulin glargine (LANTUS) 100 UNIT/ML injection Inject 0.65 mLs (65 Units total) into the skin daily. (Patient taking differently: Inject 80 Units into the skin daily.) 10 mL 11   Multiple Vitamin (MULTIVITAMIN) tablet Take 1 tablet by mouth daily.     omeprazole (PRILOSEC) 40 MG capsule TAKE 1 CAPSULE(40 MG) BY MOUTH DAILY 90 capsule 1   pravastatin (PRAVACHOL) 10 MG tablet TAKE 1 TABLET(10 MG) BY MOUTH DAILY 30 tablet 2   tamoxifen (NOLVADEX) 20 MG tablet Take 20 mg by mouth daily.     Vitamin D, Ergocalciferol, (DRISDOL) 1.25 MG (50000 UNIT) CAPS capsule TAKE 1 CAPSULE BY MOUTH EVERY 7 DAYS 12 capsule 0   zinc sulfate 220 (50 Zn) MG capsule Take 1 capsule (220 mg total) by mouth daily. 30 capsule 1   COVID-19 mRNA vaccine, Pfizer, 30 MCG/0.3ML injection INJECT AS DIRECTED .3 mL 0   No facility-administered medications prior to visit.    Allergies  Allergen Reactions   Ramipril Anaphylaxis   Other Diarrhea    Severe intolerance to Chemotherapy in the past.   Sertraline Other (See Comments)    "Extreme headaches"   Adhesive [Tape] Rash    Review of Systems  HENT:  Positive for sinus pain.   Respiratory:  Positive for cough and sputum production.       Objective:    Physical Exam Neurological:     Mental Status: He is alert.  Psychiatric:        Attention and Perception: Attention and perception normal.        Mood and Affect: Mood and affect normal.        Speech: Speech normal.        Behavior: Behavior normal.        Judgment: Judgment normal.    Ht 6' (1.829 m)   Wt 235 lb (106.6 kg)   BMI 31.87 kg/m  Wt Readings from Last 3 Encounters:  01/13/21 235 lb (106.6 kg)  12/30/20 241 lb (109.3 kg)  11/21/20 241 lb 8 oz (109.5 kg)    Diabetic Foot Exam - Simple   No data filed    Lab Results  Component Value Date   WBC 4.7 12/12/2019   HGB 13.8 12/12/2019   HCT 42.1 12/12/2019    PLT 198 12/12/2019   GLUCOSE 191 (H) 09/10/2020   CHOL 79 10/01/2020   TRIG 170.0 (H) 10/01/2020   HDL 27.70 (L) 10/01/2020   LDLDIRECT 59.0 09/10/2020   LDLCALC 18 10/01/2020   ALT 28 09/10/2020  AST 28 09/10/2020   NA 138 09/10/2020   K 3.9 09/10/2020   CL 102 09/10/2020   CREATININE 0.87 09/10/2020   BUN 18 09/10/2020   CO2 26 09/10/2020   TSH 1.54 12/12/2019   HGBA1C 10.5 (H) 09/10/2020   MICROALBUR 2.2 (H) 09/10/2020    Lab Results  Component Value Date   TSH 1.54 12/12/2019   Lab Results  Component Value Date   WBC 4.7 12/12/2019   HGB 13.8 12/12/2019   HCT 42.1 12/12/2019   MCV 94.4 12/12/2019   PLT 198 12/12/2019   Lab Results  Component Value Date   NA 138 09/10/2020   K 3.9 09/10/2020   CO2 26 09/10/2020   GLUCOSE 191 (H) 09/10/2020   BUN 18 09/10/2020   CREATININE 0.87 09/10/2020   BILITOT 0.9 09/10/2020   ALKPHOS 44 09/10/2020   AST 28 09/10/2020   ALT 28 09/10/2020   PROT 5.7 (L) 09/10/2020   ALBUMIN 3.9 09/10/2020   CALCIUM 9.5 09/10/2020   ANIONGAP 12 10/27/2019   GFR 85.11 09/10/2020   Lab Results  Component Value Date   CHOL 79 10/01/2020   Lab Results  Component Value Date   HDL 27.70 (L) 10/01/2020   Lab Results  Component Value Date   LDLCALC 18 10/01/2020   Lab Results  Component Value Date   TRIG 170.0 (H) 10/01/2020   Lab Results  Component Value Date   CHOLHDL 3 10/01/2020   Lab Results  Component Value Date   HGBA1C 10.5 (H) 09/10/2020       Assessment & Plan:   Problem List Items Addressed This Visit       Unprioritized   Viral URI with cough - Primary    Improving.  Declines and rx for symptoms management.  Advised pt to call if new worsening symptoms or if symptoms do not continue to improve.       No orders of the defined types were placed in this encounter.  7 minutes spent on today's phone visit.   I discussed the assessment and treatment plan with the patient. The patient was provided an  opportunity to ask questions and all were answered. The patient agreed with the plan and demonstrated an understanding of the instructions.   The patient was advised to call back or seek an in-person evaluation if the symptoms worsen or if the condition fails to improve as anticipated.  I,Lyric Barr-McArthur,acting as a Education administrator for [employer], NP.,have documented all relevant documentation on the behalf of Nance Pear, NP,as directed by  Nance Pear, NP while in the presence of Nance Pear, NP.  Nance Pear, NP [employer] [facility] [employer] [phone] (phone) [phone] (fax)  [place]

## 2021-01-20 ENCOUNTER — Other Ambulatory Visit (HOSPITAL_BASED_OUTPATIENT_CLINIC_OR_DEPARTMENT_OTHER): Payer: Self-pay

## 2021-01-20 MED ORDER — INFLUENZA VAC A&B SA ADJ QUAD 0.5 ML IM PRSY
PREFILLED_SYRINGE | INTRAMUSCULAR | 0 refills | Status: DC
  Filled 2021-01-20: qty 0.5, 1d supply, fill #0

## 2021-01-21 ENCOUNTER — Other Ambulatory Visit (HOSPITAL_BASED_OUTPATIENT_CLINIC_OR_DEPARTMENT_OTHER): Payer: Self-pay

## 2021-03-11 ENCOUNTER — Other Ambulatory Visit: Payer: Self-pay | Admitting: Family Medicine

## 2021-03-11 ENCOUNTER — Encounter: Payer: Self-pay | Admitting: Family Medicine

## 2021-03-13 ENCOUNTER — Ambulatory Visit (INDEPENDENT_AMBULATORY_CARE_PROVIDER_SITE_OTHER): Payer: Medicare Other | Admitting: Family Medicine

## 2021-03-13 ENCOUNTER — Encounter: Payer: Self-pay | Admitting: Family Medicine

## 2021-03-13 VITALS — BP 130/72 | HR 83 | Temp 98.1°F | Ht 72.0 in | Wt 243.0 lb

## 2021-03-13 DIAGNOSIS — I10 Essential (primary) hypertension: Secondary | ICD-10-CM

## 2021-03-13 DIAGNOSIS — E785 Hyperlipidemia, unspecified: Secondary | ICD-10-CM | POA: Diagnosis not present

## 2021-03-13 MED ORDER — NYSTATIN 100000 UNIT/GM EX CREA: 1.0000 "application " | TOPICAL_CREAM | Freq: Two times a day (BID) | CUTANEOUS | 0 refills | Status: DC

## 2021-03-13 NOTE — Patient Instructions (Addendum)
Give [place] 2-3 business days to get the results of your labs back.   Stop the pravastatin for 2 weeks and let me know what happens on MyChart. Stay hydrated.    Keep the diet clean and stay active.  The new Shingrix vaccine (for shingles) is a 2 shot series. It can make people feel low energy, achy and almost like they have the flu for 48 hours after injection. Please plan accordingly when deciding on when to get this shot. Call your pharmacy to get this. The second shot of the series is less severe regarding the side effects, but it still lasts 48 hours.   I recommend getting the updated bivalent covid vaccination booster at your convenience.   Let us know if you need anything.

## 2021-03-13 NOTE — Progress Notes (Signed)
Chief Complaint  Patient presents with   Follow-up    6 month     Subjective John [name] is a 75 y.o. male who presents for hypertension follow up. He does not monitor home blood pressures. He is compliant with medication- Norvasc 5 mg/d. Patient has these side effects of medication: none He is sometimes adhering to a healthy diet overall. Current exercise: a little walking No Cp or SOB.  Hyperlipidemia Patient presents for dyslipidemia follow up. Currently being treated with pravastatin 10 mg/d and compliance with treatment thus far has been good. He denies myalgias but does have cramping.  Diet/exercise as above.  The patient is not known to have coexisting coronary artery disease.   Past Medical History:  Diagnosis Date   BPH (benign prostatic hyperplasia)    CTS (carpal tunnel syndrome)    Depression    Diabetes mellitus    Essential hypertension    GERD (gastroesophageal reflux disease)    History of breast cancer    Hypercholesteremia    Neuropathy     Exam BP 130/72   Pulse 83   Temp 98.1 F (36.7 C) (Oral)   Ht 6' (1.829 m)   Wt 243 lb (110.2 kg)   SpO2 97%   BMI 32.96 kg/m  General:  well developed, well nourished, in no apparent distress Heart: RRR, no bruits, 2+ pitting LE edema tapering at lower 1/3 of tibia Lungs: clear to auscultation, no accessory muscle use Psych: well oriented with normal range of affect and appropriate judgment/insight  Essential hypertension  Hyperlipidemia, unspecified hyperlipidemia type  Chronic, stable. Cont Norvasc 5 mg/d. Counseled on diet and exercise. Chronic, stable. Stop pravastatin for 2 weeks. If still having issues, just go back on Lipitor but let me know.  F/u in 6 mo or prn. The patient voiced understanding and agreement to the plan.  [place], DO 03/13/21  12:59 PM

## 2021-03-16 ENCOUNTER — Encounter: Payer: Self-pay | Admitting: Family Medicine

## 2021-03-20 ENCOUNTER — Other Ambulatory Visit: Payer: Self-pay | Admitting: Family Medicine

## 2021-03-20 ENCOUNTER — Other Ambulatory Visit (INDEPENDENT_AMBULATORY_CARE_PROVIDER_SITE_OTHER): Payer: Medicare Other

## 2021-03-20 ENCOUNTER — Encounter: Payer: Self-pay | Admitting: Family Medicine

## 2021-03-20 ENCOUNTER — Ambulatory Visit: Payer: Medicare Other | Admitting: Family Medicine

## 2021-03-20 VITALS — BP 120/60 | HR 89 | Temp 98.5°F | Resp 18 | Ht 72.0 in | Wt 247.4 lb

## 2021-03-20 DIAGNOSIS — R7401 Elevation of levels of liver transaminase levels: Secondary | ICD-10-CM

## 2021-03-20 DIAGNOSIS — J40 Bronchitis, not specified as acute or chronic: Secondary | ICD-10-CM | POA: Diagnosis not present

## 2021-03-20 DIAGNOSIS — J0110 Acute frontal sinusitis, unspecified: Secondary | ICD-10-CM | POA: Diagnosis not present

## 2021-03-20 DIAGNOSIS — E785 Hyperlipidemia, unspecified: Secondary | ICD-10-CM | POA: Diagnosis not present

## 2021-03-20 LAB — COMPREHENSIVE METABOLIC PANEL
ALT: 37 U/L (ref 0–53)
AST: 42 U/L — ABNORMAL HIGH (ref 0–37)
Albumin: 3.7 g/dL (ref 3.5–5.2)
Alkaline Phosphatase: 41 U/L (ref 39–117)
BUN: 14 mg/dL (ref 6–23)
CO2: 27 mEq/L (ref 19–32)
Calcium: 9.7 mg/dL (ref 8.4–10.5)
Chloride: 102 mEq/L (ref 96–112)
Creatinine, Ser: 0.89 mg/dL (ref 0.40–1.50)
GFR: 84.22 mL/min (ref >60.00)
Glucose, Bld: 145 mg/dL — ABNORMAL HIGH (ref 70–99)
Potassium: 4.1 mEq/L (ref 3.5–5.1)
Sodium: 139 mEq/L (ref 135–145)
Total Bilirubin: 0.8 mg/dL (ref 0.2–1.2)
Total Protein: 5.7 g/dL — ABNORMAL LOW (ref 6.0–8.3)

## 2021-03-20 LAB — LDL CHOLESTEROL, DIRECT: Direct LDL: 64 mg/dL

## 2021-03-20 LAB — LIPID PANEL
Cholesterol: 131 mg/dL (ref 0–200)
HDL: 29 mg/dL — ABNORMAL LOW (ref >39.00)
NonHDL: 101.97
Total CHOL/HDL Ratio: 5
Triglycerides: 244 mg/dL — ABNORMAL HIGH (ref 0.0–149.0)
VLDL: 48.8 mg/dL — ABNORMAL HIGH (ref 0.0–40.0)

## 2021-03-20 MED ORDER — PREDNISONE 10 MG PO TABS: ORAL_TABLET | ORAL | 0 refills | Status: DC

## 2021-03-20 MED ORDER — CEFDINIR 300 MG PO CAPS: 300.0000 mg | ORAL_CAPSULE | Freq: Two times a day (BID) | ORAL | 0 refills | Status: DC

## 2021-03-20 NOTE — Assessment & Plan Note (Signed)
pred taper  abx per orders F/u if no improvement by Monday Pt did not want cough med or flonase

## 2021-03-20 NOTE — Patient Instructions (Signed)
Acute Bronchitis, Adult °Acute bronchitis is sudden inflammation of the main airways (bronchi) that come off the windpipe (trachea) in the lungs. The swelling causes the airways to get smaller and make more mucus than normal. This can make it hard to breathe and can cause coughing or noisy breathing (wheezing). °Acute bronchitis may last several weeks. The cough may last longer. Allergies, asthma, and exposure to smoke may make the condition worse. °What are the causes? °This condition can be caused by germs and by substances that irritate the lungs, including: °Cold and flu viruses. The most common cause of this condition is the virus that causes the common cold. °Bacteria. This is less common. °Breathing in substances that irritate the lungs, including: °Smoke from cigarettes and other forms of tobacco. °Dust and pollen. °Fumes from household cleaning products, gases, or burned fuel. °Indoor or outdoor air pollution. °What increases the risk? °The following factors may make you more likely to develop this condition: °A weak body's defense system, also called the immune system. °A condition that affects your lungs and breathing, such as asthma. °What are the signs or symptoms? °Common symptoms of this condition include: °Coughing. This may bring up clear, yellow, or green mucus from your lungs (sputum). °Wheezing. °Runny or stuffy nose. °Having too much mucus in your lungs (chest congestion). °Shortness of breath. °Aches and pains, including sore throat or chest. °How is this diagnosed? °This condition is usually diagnosed based on: °Your symptoms and medical history. °A physical exam. °You may also have other tests, including tests to rule out other conditions, such as pneumonia. These tests include: °A test of lung function. °Test of a mucus sample to look for the presence of bacteria. °Tests to check the oxygen level in your blood. °Blood tests. °Chest X-ray. °How is this treated? °Most cases of acute bronchitis  clear up over time without treatment. Your health care provider may recommend: °Drinking more fluids to help thin your mucus so it is easier to cough up. °Taking inhaled medicine (inhaler) to improve air flow in and out of your lungs. °Using a vaporizer or a humidifier. These are machines that add water to the air to help you breathe better. °Taking a medicine that thins mucus and clears congestion (expectorant). °Taking a medicine that prevents or stops coughing (cough suppressant). °It is notcommon to take an antibiotic medicine for this condition. °Follow these instructions at home: ° °Take over-the-counter and prescription medicines only as told by your health care provider. °Use an inhaler, vaporizer, or humidifier as told by your health care provider. °Take two teaspoons (10 mL) of honey at bedtime to lessen coughing at night. °Drink enough fluid to keep your urine pale yellow. °Do not use any products that contain nicotine or tobacco. These products include cigarettes, chewing tobacco, and vaping devices, such as e-cigarettes. If you need help quitting, ask your health care provider. °Get plenty of rest. °Return to your normal activities as told by your health care provider. Ask your health care provider what activities are safe for you. °Keep all follow-up visits. This is important. °How is this prevented? °To lower your risk of getting this condition again: °Wash your hands often with soap and water for at least 20 seconds. If soap and water are not available, use hand sanitizer. °Avoid contact with people who have cold symptoms. °Try not to touch your mouth, nose, or eyes with your hands. °Avoid breathing in smoke or chemical fumes. Breathing smoke or chemical fumes will make your condition   worse. °Get the flu shot every year. °Contact a health care provider if: °Your symptoms do not improve after 2 weeks. °You have trouble coughing up the mucus. °Your cough keeps you awake at night. °You have a  fever. °Get help right away if you: °Cough up blood. °Feel pain in your chest. °Have severe shortness of breath. °Faint or keep feeling like you are going to faint. °Have a severe headache. °Have a fever or chills that get worse. °These symptoms may represent a serious problem that is an emergency. Do not wait to see if the symptoms will go away. Get medical help right away. Call your local emergency services (911 in the [place] Do not drive yourself to the hospital. °Summary °Acute bronchitis is inflammation of the main airways (bronchi) that come off the windpipe (trachea) in the lungs. The swelling causes the airways to get smaller and make more mucus than normal. °Drinking more fluids can help thin your mucus so it is easier to cough up. °Take over-the-counter and prescription medicines only as told by your health care provider. °Do not use any products that contain nicotine or tobacco. These products include cigarettes, chewing tobacco, and vaping devices, such as e-cigarettes. If you need help quitting, ask your health care provider. °Contact a health care provider if your symptoms do not improve after 2 weeks. °This information is not intended to replace advice given to you by your health care provider. Make sure you discuss any questions you have with your health care provider. °Document Revised: 07/30/2020 Document Reviewed: 07/30/2020 °Elsevier Patient Education © 2022 [employer] ° °

## 2021-03-20 NOTE — Assessment & Plan Note (Signed)
Pt did not want flonase pred taper sent in abx per orders F/u if no improvement

## 2021-03-20 NOTE — Progress Notes (Signed)
Established Patient Office Visit  Subjective:  Patient ID: John [name], male    DOB: 08-09-1945  Age: 75 y.o. MRN: [id number]  CC:  Chief Complaint  Patient presents with   Cough    Pt states sxs going on since Thanksgiving. Non-productive cough and sinus concerns.     HPI John [name] presents for cough since thanksgiving --- not productive , sinus pressure / pain  He had a z pak a week ago that did not help   Past Medical History:  Diagnosis Date   BPH (benign prostatic hyperplasia)    CTS (carpal tunnel syndrome)    Depression    Diabetes mellitus    Essential hypertension    GERD (gastroesophageal reflux disease)    History of breast cancer    Hypercholesteremia    Neuropathy     Past Surgical History:  Procedure Laterality Date   CATARACT EXTRACTION, BILATERAL  01/18/2017   LITHOTRIPSY     MASTECTOMY Right 05/02/2018   TONSILLECTOMY AND ADENOIDECTOMY      Family History  Problem Relation Age of Onset   Lymphoma Mother    Cancer Mother    Diabetes Father    Stroke Father    Ovarian cancer Sister    Cancer Sister    Cancer Paternal Grandmother     Social History   Socioeconomic History   Marital status: Married    Spouse name: Not on file   Number of children: Not on file   Years of education: Not on file   Highest education level: Not on file  Occupational History   Not on file  Tobacco Use   Smoking status: Never   Smokeless tobacco: Never  Substance and Sexual Activity   Alcohol use: No   Drug use: No   Sexual activity: Not Currently    Partners: Female  Other Topics Concern   Not on file  Social History Narrative   Not on file   Social Determinants of Health   Financial Resource Strain: Low Risk    Difficulty of Paying Living Expenses: Not hard at all  Food Insecurity: No Food Insecurity   Worried About Charity fundraiser in the Last Year: Never true   [place] in the Last Year: Never true  Transportation Needs:  No Transportation Needs   Lack of Transportation (Medical): No   Lack of Transportation (Non-Medical): No  Physical Activity: Inactive   Days of Exercise per Week: 0 days   Minutes of Exercise per Session: 0 min  Stress: Not on file  Social Connections: Not on file  Intimate Partner Violence: Not At Risk   Fear of Current or Ex-Partner: No   Emotionally Abused: No   Physically Abused: No   Sexually Abused: No    Outpatient Medications Prior to Visit  Medication Sig Dispense Refill   acetaminophen (TYLENOL) 650 MG CR tablet Take 1,300 mg by mouth every 8 (eight) hours as needed for pain.     amLODipine (NORVASC) 5 MG tablet TAKE 1 TABLET(5 MG) BY MOUTH DAILY 90 tablet 3   Ascorbic Acid (VITAMIN C) 1000 MG tablet Take 1,000 mg by mouth in the morning and at bedtime.     aspirin 325 MG EC tablet Take 325 mg by mouth daily.     insulin glargine (LANTUS) 100 UNIT/ML injection Inject 0.65 mLs (65 Units total) into the skin daily. (Patient taking differently: Inject 80 Units into the skin daily.) 10 mL 11  Multiple Vitamin (MULTIVITAMIN) tablet Take 1 tablet by mouth daily.     nystatin cream (MYCOSTATIN) Apply 1 application topically 2 (two) times daily. 30 g 0   omeprazole (PRILOSEC) 40 MG capsule TAKE 1 CAPSULE(40 MG) BY MOUTH DAILY 90 capsule 1   pravastatin (PRAVACHOL) 10 MG tablet TAKE 1 TABLET(10 MG) BY MOUTH DAILY 30 tablet 2   tamoxifen (NOLVADEX) 20 MG tablet Take 20 mg by mouth daily.     Vitamin D, Ergocalciferol, (DRISDOL) 1.25 MG (50000 UNIT) CAPS capsule TAKE 1 CAPSULE BY MOUTH EVERY 7 DAYS 12 capsule 0   zinc sulfate 220 (50 Zn) MG capsule Take 1 capsule (220 mg total) by mouth daily. 30 capsule 1   influenza vaccine adjuvanted (FLUAD) 0.5 ML injection Inject into the muscle. (Patient not taking: Reported on 03/20/2021) 0.5 mL 0   No facility-administered medications prior to visit.    Allergies  Allergen Reactions   Ramipril Anaphylaxis   Other Diarrhea    Severe  intolerance to Chemotherapy in the past.   Sertraline Other (See Comments)    "Extreme headaches"   Adhesive [Tape] Rash    ROS Review of Systems  Constitutional:  Negative for chills and fever.  HENT:  Positive for congestion, sinus pressure and sinus pain. Negative for hearing loss, postnasal drip and rhinorrhea.   Eyes:  Negative for discharge.  Respiratory:  Positive for cough and chest tightness. Negative for shortness of breath and wheezing.   Cardiovascular:  Negative for chest pain, palpitations and leg swelling.  Gastrointestinal:  Negative for abdominal pain, blood in stool, constipation, diarrhea, nausea and vomiting.  Genitourinary:  Negative for dysuria, frequency, hematuria and urgency.  Musculoskeletal:  Negative for back pain and myalgias.  Skin:  Negative for rash.  Allergic/Immunologic: Negative for environmental allergies.  Neurological:  Negative for dizziness, weakness and headaches.  Hematological:  Does not bruise/bleed easily.  Psychiatric/Behavioral:  Negative for suicidal ideas. The patient is not nervous/anxious.      Objective:    Physical Exam Vitals and nursing note reviewed.  Constitutional:      Appearance: He is well-developed.  HENT:     Head: Normocephalic and atraumatic.     Nose:     Right Sinus: Frontal sinus tenderness present.     Left Sinus: Frontal sinus tenderness present.  Eyes:     Pupils: Pupils are equal, round, and reactive to light.  Neck:     Thyroid: No thyromegaly.  Cardiovascular:     Rate and Rhythm: Normal rate and regular rhythm.     Heart sounds: No murmur heard. Pulmonary:     Effort: Pulmonary effort is normal. No respiratory distress.     Breath sounds: Normal breath sounds. No wheezing or rales.  Chest:     Chest wall: No tenderness.  Musculoskeletal:        General: No tenderness.     Cervical back: Normal range of motion and neck supple.  Skin:    General: Skin is warm and dry.  Neurological:     Mental  Status: He is alert and oriented to person, place, and time.  Psychiatric:        Behavior: Behavior normal.        Thought Content: Thought content normal.        Judgment: Judgment normal.    BP 120/60 (BP Location: Left Arm, Patient Position: Sitting, Cuff Size: Large)   Pulse 89   Temp 98.5 F (36.9 C) (Oral)   Resp  18   Ht 6' (1.829 m)   Wt 247 lb 6.4 oz (112.2 kg)   SpO2 96%   BMI 33.55 kg/m  Wt Readings from Last 3 Encounters:  03/20/21 247 lb 6.4 oz (112.2 kg)  03/13/21 243 lb (110.2 kg)  01/13/21 235 lb (106.6 kg)     Health Maintenance Due  Topic Date Due   Zoster Vaccines- Shingrix (1 of 2) Never done   COVID-19 Vaccine (4 - Booster for Pfizer series) 03/31/2020    There are no preventive care reminders to display for this patient.  Lab Results  Component Value Date   TSH 1.54 12/12/2019   Lab Results  Component Value Date   WBC 4.7 12/12/2019   HGB 13.8 12/12/2019   HCT 42.1 12/12/2019   MCV 94.4 12/12/2019   PLT 198 12/12/2019   Lab Results  Component Value Date   NA 139 03/20/2021   K 4.1 03/20/2021   CO2 27 03/20/2021   GLUCOSE 145 (H) 03/20/2021   BUN 14 03/20/2021   CREATININE 0.89 03/20/2021   BILITOT 0.8 03/20/2021   ALKPHOS 41 03/20/2021   AST 42 (H) 03/20/2021   ALT 37 03/20/2021   PROT 5.7 (L) 03/20/2021   ALBUMIN 3.7 03/20/2021   CALCIUM 9.7 03/20/2021   ANIONGAP 12 10/27/2019   GFR 84.22 03/20/2021   Lab Results  Component Value Date   CHOL 131 03/20/2021   Lab Results  Component Value Date   HDL 29.00 (L) 03/20/2021   Lab Results  Component Value Date   LDLCALC 18 10/01/2020   Lab Results  Component Value Date   TRIG 244.0 (H) 03/20/2021   Lab Results  Component Value Date   CHOLHDL 5 03/20/2021   Lab Results  Component Value Date   HGBA1C 10.5 (H) 09/10/2020      Assessment & Plan:   Problem List Items Addressed This Visit       Unprioritized   Acute non-recurrent frontal sinusitis    Pt did not  want flonase pred taper sent in abx per orders F/u if no improvement       Relevant Medications   predniSONE (DELTASONE) 10 MG tablet   cefdinir (OMNICEF) 300 MG capsule   Bronchitis - Primary    pred taper  abx per orders F/u if no improvement by Monday Pt did not want cough med or flonase       Relevant Medications   predniSONE (DELTASONE) 10 MG tablet   cefdinir (OMNICEF) 300 MG capsule    Meds ordered this encounter  Medications   predniSONE (DELTASONE) 10 MG tablet    Sig: TAKE 3 TABLETS PO QD FOR 3 DAYS THEN TAKE 2 TABLETS PO QD FOR 3 DAYS THEN TAKE 1 TABLET PO QD FOR 3 DAYS THEN TAKE 1/2 TAB PO QD FOR 3 DAYS    Dispense:  20 tablet    Refill:  0   cefdinir (OMNICEF) 300 MG capsule    Sig: Take 1 capsule (300 mg total) by mouth 2 (two) times daily.    Dispense:  14 capsule    Refill:  0    Follow-up: Return if symptoms worsen or fail to improve.    Ann Held, DO

## 2021-03-26 ENCOUNTER — Other Ambulatory Visit: Payer: Self-pay

## 2021-03-26 ENCOUNTER — Ambulatory Visit (HOSPITAL_BASED_OUTPATIENT_CLINIC_OR_DEPARTMENT_OTHER)
Admission: RE | Admit: 2021-03-26 | Discharge: 2021-03-26 | Disposition: A | Discharge status: Home / Self Care | Payer: Medicare Other | Source: Ambulatory Visit | Attending: Family Medicine | Admitting: Family Medicine

## 2021-03-26 ENCOUNTER — Other Ambulatory Visit (HOSPITAL_BASED_OUTPATIENT_CLINIC_OR_DEPARTMENT_OTHER): Payer: Medicare Other

## 2021-03-26 DIAGNOSIS — R7401 Elevation of levels of liver transaminase levels: Secondary | ICD-10-CM | POA: Insufficient documentation

## 2021-04-01 ENCOUNTER — Encounter: Payer: Self-pay | Admitting: Family Medicine

## 2021-04-01 ENCOUNTER — Other Ambulatory Visit (HOSPITAL_BASED_OUTPATIENT_CLINIC_OR_DEPARTMENT_OTHER): Payer: Self-pay

## 2021-04-01 ENCOUNTER — Other Ambulatory Visit: Payer: Self-pay | Admitting: Family Medicine

## 2021-04-01 MED ORDER — DIPHENOXYLATE-ATROPINE 2.5-0.025 MG/5ML PO LIQD
5.0000 mL | Freq: Four times a day (QID) | ORAL | 0 refills | Status: DC | PRN
  Filled 2021-04-01: qty 120, 6d supply, fill #0
  Filled 2021-04-02: qty 60, 3d supply, fill #0

## 2021-04-02 ENCOUNTER — Other Ambulatory Visit (HOSPITAL_BASED_OUTPATIENT_CLINIC_OR_DEPARTMENT_OTHER): Payer: Self-pay

## 2021-04-18 ENCOUNTER — Other Ambulatory Visit: Payer: Self-pay | Admitting: Family Medicine

## 2021-05-22 ENCOUNTER — Other Ambulatory Visit (HOSPITAL_BASED_OUTPATIENT_CLINIC_OR_DEPARTMENT_OTHER): Payer: Self-pay

## 2021-05-22 ENCOUNTER — Ambulatory Visit (INDEPENDENT_AMBULATORY_CARE_PROVIDER_SITE_OTHER): Payer: Medicare Other | Admitting: Family Medicine

## 2021-05-22 ENCOUNTER — Encounter: Payer: Self-pay | Admitting: Family Medicine

## 2021-05-22 VITALS — BP 140/72 | HR 97 | Temp 98.4°F | Ht 72.0 in | Wt 252.2 lb

## 2021-05-22 DIAGNOSIS — D489 Neoplasm of uncertain behavior, unspecified: Secondary | ICD-10-CM

## 2021-05-22 DIAGNOSIS — L570 Actinic keratosis: Secondary | ICD-10-CM | POA: Diagnosis not present

## 2021-05-22 DIAGNOSIS — L918 Other hypertrophic disorders of the skin: Secondary | ICD-10-CM | POA: Diagnosis not present

## 2021-05-22 DIAGNOSIS — C44319 Basal cell carcinoma of skin of other parts of face: Secondary | ICD-10-CM

## 2021-05-22 MED ORDER — SHINGRIX 50 MCG/0.5ML IM SUSR
INTRAMUSCULAR | 1 refills | Status: DC
  Filled 2021-05-22: qty 0.5, 1d supply, fill #0

## 2021-05-22 NOTE — Progress Notes (Signed)
Chief Complaint  Patient presents with   skin tags     John [name] is a 76 y.o. male here for a skin complaint.  Patient has irritated lesions on his right ear, scalp, left chin, and nose for years.  The spot on his nose will bleed, scab, and then start bleeding again.  It does not seem to be growing.  The other lesions are just bothersome.  No drainage, itching, or pain.  No new lotions, soaps, topicals, detergents, sick contacts, fevers.  He has not tried anything so far but is interested in having them removed.   Past Medical History:  Diagnosis Date   BPH (benign prostatic hyperplasia)    CTS (carpal tunnel syndrome)    Depression    Diabetes mellitus    Essential hypertension    GERD (gastroesophageal reflux disease)    History of breast cancer    Hypercholesteremia    Neuropathy     BP 140/72    Pulse 97    Temp 98.4 F (36.9 C) (Oral)    Ht 6' (1.829 m)    Wt 252 lb 4 oz (114.4 kg)    SpO2 94%    BMI 34.21 kg/m  Gen: awake, alert, appearing stated age Lungs: No accessory muscle use Skin: There is an elongated hyperkeratotic lesion measuring 0.2 cm in diameter on the right earlobe without surrounding erythema, fluctuance, or erythema.  There is a 0.4 cm in diameter lesion on the anterior right scalp that is scaly and raised.  There is no erythema, warmth, fluctuance, or drainage.  Over the right chin there is a 0.3 cm in diameter lesion that is raised and scaly.  There is also hair growing through it.  No erythema, warmth, fluctuance, or drainage.  Over the tip of the nose, there is a 0.6 cm slightly raised lesion that is excoriated.  There is no scaling, fluctuance, drainage, excessive warmth, tenderness. Psych: Age appropriate judgment and insight  Procedure note; shave biopsy Informed consent was obtained. The area on the scalp and chin were cleaned with alcohol and injected with 0.5 and 0.3 mL of 1% lidocaine with epinephrine respectively. A Dermablade was slightly  bent and used to cut under the area of interest. The specimen was placed in a sterile specimen cup and sent to the lab. The area on the scalp was not sent to the lab. The areas were cauterized ensuring adequate hemostasis. The areas were respectively dressed with triple antibiotic ointment and a bandage. There were no complications noted. The patient tolerated the procedure well.  Procedure note; shave biopsy Informed consent was obtained. The area on the nose was cleaned with alcohol and injected with 0.2 mL of 1% lidocaine without epinephrine. A Dermablade was slightly bent and used to cut under the area of interest. The specimen was placed in a sterile specimen cup and sent to the lab. The area was then cauterized ensuring adequate hemostasis. The area was dressed with triple antibiotic ointment and a bandage. There were no complications noted. The patient tolerated the procedure well.  Procedure note; skin tag removal Informed consent obtained. Tag in the right ear was cleaned with alcohol and injected with 1% lidocaine without epinephrine. The skin tag was grasped with forceps and removed with a blade. Hemostasis was obtained using cautery. Triple antibiotic ointment and a bandage were placed. There were no complications noted. The patient tolerated the procedure well.  Skin tag - Plan: Surgical pathology( [place]/ [facility]), Surgical pathology( [facility]/ [facility]), Surgical pathology( [place]/ [facility])  Neoplasm of uncertain behavior - Plan: Surgical pathology( [place]/ [facility]), Surgical pathology( [place]/ [facility]), Surgical pathology( [place]/ [facility])  Lesion on nose I am concerned about a scc.  Lesion on face I would like to make sure isn't a bcc.  Lesion on ear and scalp benign, removed because they are irritating patient. Aftercare instructions verbalized and written down. Warning signs and symptoms verbalized and written down in AVS.   F/u prn. The patient voiced understanding and agreement to the plan.  [place], DO 05/22/21 1:42 PM

## 2021-05-22 NOTE — Patient Instructions (Addendum)
Do not shower for the rest of the day. When you do wash it, use only soap and water. Do not vigorously scrub. Apply triple antibiotic ointment (like Neosporin) twice daily. Keep the area clean and dry.   Things to look out for: increasing pain not relieved by acetaminophen, fevers, spreading redness, drainage of pus, or foul odor.  Give [place] 1 week to get the results of your biopsy back.  Go to the pharmacy for your Shingrix/shingles shot.   Let us know if you need anything.

## 2021-05-27 ENCOUNTER — Other Ambulatory Visit: Payer: Self-pay | Admitting: Family Medicine

## 2021-05-27 DIAGNOSIS — C4491 Basal cell carcinoma of skin, unspecified: Secondary | ICD-10-CM

## 2021-06-12 ENCOUNTER — Ambulatory Visit (INDEPENDENT_AMBULATORY_CARE_PROVIDER_SITE_OTHER): Payer: Medicare Other | Admitting: Family Medicine

## 2021-06-12 ENCOUNTER — Encounter: Payer: Self-pay | Admitting: Family Medicine

## 2021-06-12 VITALS — BP 130/68 | HR 98 | Temp 98.1°F | Resp 16 | Ht 72.0 in | Wt 255.2 lb

## 2021-06-12 DIAGNOSIS — I10 Essential (primary) hypertension: Secondary | ICD-10-CM | POA: Diagnosis not present

## 2021-06-12 DIAGNOSIS — M7989 Other specified soft tissue disorders: Secondary | ICD-10-CM | POA: Diagnosis not present

## 2021-06-12 MED ORDER — HYDROCHLOROTHIAZIDE 25 MG PO TABS: 25.0000 mg | ORAL_TABLET | Freq: Every day | ORAL | 3 refills | Status: DC

## 2021-06-12 NOTE — Patient Instructions (Signed)
For the swelling in your lower extremities, be sure to elevate your legs when able, mind the salt intake, stay physically active and consider wearing compression stockings.  Let us know if you need anything. 

## 2021-06-12 NOTE — Progress Notes (Signed)
Chief Complaint  ?Patient presents with  ? Foot Swelling  ?  Here for complains of swelling foot   ? ? ?Paulla Dolly here for bilateral leg swelling. ? ?Duration: 9 months ?Hx of prolonged bedrest, recent surgery, travel or injury? No ?Pain the calf? No ?SOB? No ?Personal or family history of clot or bleeding disorder? No ?Hx of heart failure, renal failure, hepatic failure? No ?Causing quite a bit of pain and worsening his chronic neuropathy.  ?Has been taking NSAIDs and Tylenol with some relief.  ?He is on Norvasc 5 mg/d as he had anaphylaxis on an ACEi.  ? ?Past Medical History:  ?Diagnosis Date  ? BPH (benign prostatic hyperplasia)   ? CTS (carpal tunnel syndrome)   ? Depression   ? Diabetes mellitus   ? Essential hypertension   ? GERD (gastroesophageal reflux disease)   ? History of breast cancer   ? Hypercholesteremia   ? Neuropathy   ? ?Family History  ?Problem Relation Age of Onset  ? Lymphoma Mother   ? Cancer Mother   ? Diabetes Father   ? Stroke Father   ? Ovarian cancer Sister   ? Cancer Sister   ? Cancer Paternal Grandmother   ? ?Past Surgical History:  ?Procedure Laterality Date  ? CATARACT EXTRACTION, BILATERAL  01/18/2017  ? LITHOTRIPSY    ? MASTECTOMY Right 05/02/2018  ? TONSILLECTOMY AND ADENOIDECTOMY    ? ? ?Current Outpatient Medications:  ?  acetaminophen (TYLENOL) 650 MG CR tablet, Take 1,300 mg by mouth every 8 (eight) hours as needed for pain., Disp: , Rfl:  ?  amLODipine (NORVASC) 5 MG tablet, TAKE 1 TABLET(5 MG) BY MOUTH DAILY, Disp: 90 tablet, Rfl: 3 ?  Ascorbic Acid (VITAMIN C) 1000 MG tablet, Take 1,000 mg by mouth in the morning and at bedtime., Disp: , Rfl:  ?  aspirin 325 MG EC tablet, Take 325 mg by mouth daily., Disp: , Rfl:  ?  diphenoxylate-atropine (LOMOTIL) 2.5-0.025 MG/5ML liquid, Take 5 mLs by mouth 4 (four) times daily as needed for diarrhea or loose stools., Disp: 120 mL, Rfl: 0 ?  hydrochlorothiazide (HYDRODIURIL) 25 MG tablet, Take 1 tablet (25 mg total) by mouth  daily., Disp: 30 tablet, Rfl: 3 ?  insulin glargine (LANTUS) 100 UNIT/ML injection, Inject 0.65 mLs (65 Units total) into the skin daily. (Patient taking differently: Inject 80 Units into the skin daily.), Disp: 10 mL, Rfl: 11 ?  Multiple Vitamin (MULTIVITAMIN) tablet, Take 1 tablet by mouth daily., Disp: , Rfl:  ?  nystatin cream (MYCOSTATIN), Apply 1 application topically 2 (two) times daily., Disp: 30 g, Rfl: 0 ?  omeprazole (PRILOSEC) 40 MG capsule, TAKE 1 CAPSULE(40 MG) BY MOUTH DAILY, Disp: 90 capsule, Rfl: 1 ?  pravastatin (PRAVACHOL) 10 MG tablet, TAKE 1 TABLET(10 MG) BY MOUTH DAILY, Disp: 30 tablet, Rfl: 2 ?  tamoxifen (NOLVADEX) 20 MG tablet, Take 20 mg by mouth daily., Disp: , Rfl:  ?  Vitamin D, Ergocalciferol, (DRISDOL) 1.25 MG (50000 UNIT) CAPS capsule, TAKE 1 CAPSULE BY MOUTH EVERY 7 DAYS, Disp: 12 capsule, Rfl: 0 ?  zinc sulfate 220 (50 Zn) MG capsule, Take 1 capsule (220 mg total) by mouth daily., Disp: 30 capsule, Rfl: 1 ?  Zoster Vaccine Adjuvanted [facility]) injection, Inject into the muscle., Disp: 0.5 mL, Rfl: 1 ? ?BP 130/68 (BP Location: Right Arm, Patient Position: Sitting, Cuff Size: Normal)   Pulse 98   Temp 98.1 ?F (36.7 ?C) (Oral)   Resp 16  Ht 6' (1.829 m)   Wt 255 lb 3.2 oz (115.8 kg)   SpO2 91%   BMI 34.61 kg/m?  ?Gen- awake, alert, appears stated age ?Heart- RRR, 3+ pitting b/l LE edema tapering at knee ?Lungs- CTAB, normal effort w/o accessory muscle use ?MSK- no calf pain b/l, neg Homan's ?Psych: Age appropriate judgment and insight ? ?Localized swelling of both lower extremities ? ?Essential hypertension - Plan: hydrochlorothiazide (HYDRODIURIL) 25 MG tablet, Basic metabolic panel ? ?Chronic, unstable. Stop Norvasc, start HCTZ 25 mg/d. Elevate legs, walk, mind salt intake, compression stockings. Monitor BP at home.  F/u on swelling and BP in 1 mo. ?Pt voiced understanding and agreement to the plan. ? ?Shelda Pal, DO ?06/12/21  ?2:28 PM ? ? ?

## 2021-06-17 ENCOUNTER — Other Ambulatory Visit (INDEPENDENT_AMBULATORY_CARE_PROVIDER_SITE_OTHER): Payer: Medicare Other

## 2021-06-17 DIAGNOSIS — I10 Essential (primary) hypertension: Secondary | ICD-10-CM | POA: Diagnosis not present

## 2021-06-17 DIAGNOSIS — Z85828 Personal history of other malignant neoplasm of skin: Secondary | ICD-10-CM | POA: Diagnosis not present

## 2021-06-17 DIAGNOSIS — X32XXXA Exposure to sunlight, initial encounter: Secondary | ICD-10-CM | POA: Diagnosis not present

## 2021-06-17 DIAGNOSIS — Z08 Encounter for follow-up examination after completed treatment for malignant neoplasm: Secondary | ICD-10-CM | POA: Diagnosis not present

## 2021-06-17 DIAGNOSIS — L570 Actinic keratosis: Secondary | ICD-10-CM | POA: Diagnosis not present

## 2021-06-18 LAB — BASIC METABOLIC PANEL
BUN: 17 mg/dL (ref 6–23)
CO2: 28 mEq/L (ref 19–32)
Calcium: 9.5 mg/dL (ref 8.4–10.5)
Chloride: 96 mEq/L (ref 96–112)
Creatinine, Ser: 1.11 mg/dL (ref 0.40–1.50)
GFR: 65.14 mL/min (ref >60.00)
Glucose, Bld: 375 mg/dL — ABNORMAL HIGH (ref 70–99)
Potassium: 4.2 mEq/L (ref 3.5–5.1)
Sodium: 136 mEq/L (ref 135–145)

## 2021-06-23 ENCOUNTER — Ambulatory Visit: Payer: Medicare Other | Admitting: Family Medicine

## 2021-06-23 ENCOUNTER — Encounter: Payer: Self-pay | Admitting: Family Medicine

## 2021-06-23 VITALS — BP 134/72 | HR 97 | Temp 97.8°F | Ht 72.0 in | Wt 241.1 lb

## 2021-06-23 DIAGNOSIS — K529 Noninfective gastroenteritis and colitis, unspecified: Secondary | ICD-10-CM

## 2021-06-23 NOTE — Progress Notes (Signed)
Chief Complaint  ?Patient presents with  ? after eating on friday night got sick. has not felt right  ?  Nasal congestion ?Paperwork to complete for vacation (rental at [employer] and had to cancel due his illness)  ? ? ? ?Subjective ?John [name] is a 76 y.o. male who presents with vomiting and diarrhea ?Symptoms began 3 d ago.  ?Patient has abdominal pain, vomiting, diarrhea, URI symptoms, and cough ?Patient denies cramping, fever, and myalgias ?Treatment to date: Tylenol ?Sick contacts: none known ? ?Past Medical History:  ?Diagnosis Date  ? BPH (benign prostatic hyperplasia)   ? CTS (carpal tunnel syndrome)   ? Depression   ? Diabetes mellitus   ? Essential hypertension   ? GERD (gastroesophageal reflux disease)   ? History of breast cancer   ? Hypercholesteremia   ? Neuropathy   ? ? ?Exam ?BP 134/72   Pulse 97   Temp 97.8 ?F (36.6 ?C) (Oral)   Ht 6' (1.829 m)   Wt 241 lb 2 oz (109.4 kg)   SpO2 93%   BMI 32.70 kg/m?  ?General:  well developed, well hydrated, in no apparent distress ?Skin:  warm, no pallor or diaphoresis, no rashes ?Throat/Pharynx:  lips and gingiva without lesion; tongue and uvula midline; non-inflamed pharynx; no exudates or postnasal drainage ?Lungs:  clear to auscultation, breath sounds equal bilaterally, no respiratory distress, no wheezes ?Cardio:  RRR ?Abdomen:  abdomen soft, nontender; bowel sounds normal; no masses or organomegaly ?Psych: Appropriate judgement/insight ? ?Assessment and Plan ? ?Gastroenteritis ? ?Tylenol prn. Push fluids. Declined any symptomatic meds. Avoid aggravating foods, discussed advancing diet. ?F/u if symptoms fail to improve, sooner if worsening. ?The patient voiced understanding and agreement to the plan. ? ?Shelda Pal, DO ?06/23/21  ?1:03 PM ? ?

## 2021-06-23 NOTE — Patient Instructions (Signed)
Stay hydrated.   OK to take Tylenol 1000 mg (2 extra strength tabs) or 975 mg (3 regular strength tabs) every 6 hours as needed.  Let us know if you need anything.  

## 2021-06-24 ENCOUNTER — Telehealth: Payer: Self-pay | Admitting: Family Medicine

## 2021-06-24 ENCOUNTER — Encounter: Payer: Self-pay | Admitting: Family Medicine

## 2021-06-24 NOTE — Telephone Encounter (Signed)
Pt dropped off document to be filled out by provider (Attending Physicians Statement document - 3 pages) Pt would like document to be faxed to [phone] when ready or to call pt when ready to pick up at [phone]. Document put at front office tray under providers name.  ?

## 2021-06-24 NOTE — Telephone Encounter (Signed)
PCP completed form ?Faxed and mailed the original to the patient for his records. ?Made a copy and sent to scan ?The patient is aware of all information. ?Sent message and called him. ?

## 2021-07-02 DIAGNOSIS — I10 Essential (primary) hypertension: Secondary | ICD-10-CM | POA: Diagnosis not present

## 2021-07-02 DIAGNOSIS — E1165 Type 2 diabetes mellitus with hyperglycemia: Secondary | ICD-10-CM | POA: Diagnosis not present

## 2021-07-02 DIAGNOSIS — E1140 Type 2 diabetes mellitus with diabetic neuropathy, unspecified: Secondary | ICD-10-CM | POA: Diagnosis not present

## 2021-07-02 DIAGNOSIS — E785 Hyperlipidemia, unspecified: Secondary | ICD-10-CM | POA: Diagnosis not present

## 2021-07-13 DIAGNOSIS — Z961 Presence of intraocular lens: Secondary | ICD-10-CM | POA: Diagnosis not present

## 2021-07-13 DIAGNOSIS — H35373 Puckering of macula, bilateral: Secondary | ICD-10-CM | POA: Diagnosis not present

## 2021-07-13 DIAGNOSIS — H5203 Hypermetropia, bilateral: Secondary | ICD-10-CM | POA: Diagnosis not present

## 2021-07-13 DIAGNOSIS — E113293 Type 2 diabetes mellitus with mild nonproliferative diabetic retinopathy without macular edema, bilateral: Secondary | ICD-10-CM | POA: Diagnosis not present

## 2021-07-14 ENCOUNTER — Encounter: Payer: Self-pay | Admitting: Family Medicine

## 2021-07-14 ENCOUNTER — Ambulatory Visit (INDEPENDENT_AMBULATORY_CARE_PROVIDER_SITE_OTHER): Payer: Medicare Other | Admitting: Family Medicine

## 2021-07-14 VITALS — BP 122/68 | HR 85 | Temp 98.1°F | Ht 72.0 in | Wt 248.4 lb

## 2021-07-14 DIAGNOSIS — R252 Cramp and spasm: Secondary | ICD-10-CM

## 2021-07-14 DIAGNOSIS — M7989 Other specified soft tissue disorders: Secondary | ICD-10-CM

## 2021-07-14 MED ORDER — FUROSEMIDE 40 MG PO TABS: 40.0000 mg | ORAL_TABLET | Freq: Every day | ORAL | 3 refills | Status: DC

## 2021-07-14 MED ORDER — AMLODIPINE BESYLATE 5 MG PO TABS: ORAL_TABLET | ORAL | 3 refills | Status: DC

## 2021-07-14 MED ORDER — OMEPRAZOLE 40 MG PO CPDR: DELAYED_RELEASE_CAPSULE | ORAL | 3 refills | Status: DC

## 2021-07-14 NOTE — Patient Instructions (Addendum)
For the swelling in your lower extremities, be sure to elevate your legs when able, mind the salt intake, stay physically active and consider wearing compression stockings during the day. ? ?Let's go back on the Norvasc, stop the hydrochlorothiazide.  ? ?Keep walking.  ? ?Consider pickle juice or mustard before bed.  ? ?Let us know if you need anything. ?

## 2021-07-14 NOTE — Progress Notes (Signed)
Chief Complaint  ?Patient presents with  ? Follow-up  ? ? ?Subjective: ?Patient is a 76 y.o. male here for f/u LE edema. ? ?Stopped Norvasc 5 mg/d, started on HCTZ 25 mg/d. Reports compliance and no improvement in his swelling. He started walking. Diet is OK. No chest pain, calf pain or SOB.  He wears compression stockings at night. ? ?Past Medical History:  ?Diagnosis Date  ? BPH (benign prostatic hyperplasia)   ? CTS (carpal tunnel syndrome)   ? Depression   ? Diabetes mellitus   ? Essential hypertension   ? GERD (gastroesophageal reflux disease)   ? History of breast cancer   ? Hypercholesteremia   ? Neuropathy   ? ?Objective: ?BP 122/68   Pulse 85   Temp 98.1 ?F (36.7 ?C) (Oral)   Ht 6' (1.829 m)   Wt 248 lb 6 oz (112.7 kg)   SpO2 97%   BMI 33.69 kg/m?  ?General: Awake, appears stated age ?Heart: RRR, 2+ LE edema tapering mid tibia and gone by the knee bilaterally ?Lungs: CTAB, no rales, wheezes or rhonchi. No accessory muscle use ?MSK: No TTP over the calf region bilaterally ?Psych: Age appropriate judgment and insight, normal affect and mood ? ?Assessment and Plan: ?Localized swelling of both lower extremities - Plan: Basic metabolic panel ? ?Leg cramping - Plan: Basic metabolic panel, Magnesium ? ?chronic, uncontrolled.  Check electrolytes today.  Recommended elevation, physical activity, minding salt intake, and wearing compression stockings, perhaps outside of just at night.  Start furosemide 40 mg daily.  May need to add supplemental potassium depending on labs today.  Follow-up labs in 1 week, recheck with me in 1 month. ?Stop hydrochlorothiazide, resume amlodipine as this was not contributing to the swelling.  Consider a spoonful of pickle juice/mustard nightly.  Stay hydrated. ?The patient voiced understanding and agreement to the plan. ? ?Shelda Pal, DO ?07/14/21  ?3:23 PM ? ? ? ? ?

## 2021-07-21 ENCOUNTER — Other Ambulatory Visit (INDEPENDENT_AMBULATORY_CARE_PROVIDER_SITE_OTHER): Payer: Medicare Other

## 2021-07-21 DIAGNOSIS — M7989 Other specified soft tissue disorders: Secondary | ICD-10-CM | POA: Diagnosis not present

## 2021-07-21 LAB — BASIC METABOLIC PANEL WITH GFR
BUN: 19 mg/dL (ref 6–23)
CO2: 29 meq/L (ref 19–32)
Calcium: 9.3 mg/dL (ref 8.4–10.5)
Chloride: 96 meq/L (ref 96–112)
Creatinine, Ser: 1.31 mg/dL (ref 0.40–1.50)
GFR: 53.37 mL/min — ABNORMAL LOW
Glucose, Bld: 416 mg/dL — ABNORMAL HIGH (ref 70–99)
Potassium: 4.6 meq/L (ref 3.5–5.1)
Sodium: 136 meq/L (ref 135–145)

## 2021-08-12 ENCOUNTER — Ambulatory Visit: Payer: Medicare Other | Admitting: Family Medicine

## 2021-08-17 ENCOUNTER — Ambulatory Visit: Payer: Medicare Other | Admitting: Family Medicine

## 2021-08-18 DIAGNOSIS — C50421 Malignant neoplasm of upper-outer quadrant of right male breast: Secondary | ICD-10-CM | POA: Diagnosis not present

## 2021-08-18 DIAGNOSIS — C50921 Malignant neoplasm of unspecified site of right male breast: Secondary | ICD-10-CM | POA: Diagnosis not present

## 2021-08-18 DIAGNOSIS — Z170 Estrogen receptor positive status [ER+]: Secondary | ICD-10-CM | POA: Diagnosis not present

## 2021-08-18 DIAGNOSIS — G629 Polyneuropathy, unspecified: Secondary | ICD-10-CM | POA: Diagnosis not present

## 2021-08-18 DIAGNOSIS — G608 Other hereditary and idiopathic neuropathies: Secondary | ICD-10-CM | POA: Diagnosis not present

## 2021-08-18 DIAGNOSIS — Z79810 Long term (current) use of selective estrogen receptor modulators (SERMs): Secondary | ICD-10-CM | POA: Diagnosis not present

## 2021-08-18 DIAGNOSIS — Z79899 Other long term (current) drug therapy: Secondary | ICD-10-CM | POA: Diagnosis not present

## 2021-08-18 DIAGNOSIS — Z5181 Encounter for therapeutic drug level monitoring: Secondary | ICD-10-CM | POA: Diagnosis not present

## 2021-08-19 ENCOUNTER — Ambulatory Visit (INDEPENDENT_AMBULATORY_CARE_PROVIDER_SITE_OTHER): Payer: Medicare Other | Admitting: Family Medicine

## 2021-08-19 ENCOUNTER — Encounter: Payer: Self-pay | Admitting: Family Medicine

## 2021-08-19 VITALS — BP 140/78 | HR 86 | Temp 98.6°F | Ht 72.0 in | Wt 249.5 lb

## 2021-08-19 DIAGNOSIS — M7989 Other specified soft tissue disorders: Secondary | ICD-10-CM

## 2021-08-19 MED ORDER — FUROSEMIDE 80 MG PO TABS: 80.0000 mg | ORAL_TABLET | Freq: Every day | ORAL | 3 refills | Status: DC

## 2021-08-19 NOTE — Progress Notes (Signed)
Chief Complaint  ?Patient presents with  ? Follow-up  ? ? ?Subjective: ?Patient is a 76 y.o. male here for f/u swelling. ? ?Patient has been compliant with compression stockings.  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  He tries to eat healthy, eats fried things several times per week.  Elevate legs intermittently.  Has noticed no change in his swelling in both legs.  He denies any pain.  He was prescribed Lasix but did not start it as he was unsure if the pharmacy never filled it.  Denies any coughing, chest pain, or shortness of breath. ? ?Past Medical History:  ?Diagnosis Date  ? BPH (benign prostatic hyperplasia)   ? CTS (carpal tunnel syndrome)   ? Depression   ? Diabetes mellitus   ? Essential hypertension   ? GERD (gastroesophageal reflux disease)   ? History of breast cancer   ? Hypercholesteremia   ? Neuropathy   ? ? ?Objective: ?BP 140/78   Pulse 86   Temp 98.6 ?F (37 ?C) (Oral)   Ht 6' (1.829 m)   Wt 249 lb 8 oz (113.2 kg)   SpO2 96%   BMI 33.84 kg/m?  ?General: Awake, appears stated age ?Heart: RRR, 2+ pitting edema tapering at the knees bilaterally ?MSK: No calf TTP bilaterally, negative Homans ?Lungs: CTAB, no rales, wheezes or rhonchi. No accessory muscle use ?Psych: Age appropriate judgment and insight, normal affect and mood ? ?Assessment and Plan: ?Localized swelling of both lower extremities - Plan: furosemide (LASIX) 80 MG tablet, Basic metabolic panel ? ?Chronic, not controlled.  He will take half a tab of the Lasix for a total of 40 mg daily.  He will send me a message in 2 weeks.  He will return for labs in 1 week to recheck electrolytes and renal function.  I will see him in 1 month to recheck. ?The patient voiced understanding and agreement to the plan. ? ?Shelda Pal, DO ?08/19/21  ?2:54 PM ? ? ? ? ?

## 2021-08-19 NOTE — Patient Instructions (Addendum)
Keep the diet clean and stay active. ? ?Take 1/2 tab daily of the Lasix/furosemide for 2 weeks and then send me a message with how you are doing. ? ?Continue with the compression stockings, elevating legs, minding the salt intake.  ? ?Uncontrolled A1c can significantly shorten lifespan. Eating healthy and working with your endocrinologist can help lower this and lengthen your life and quality of life.  ? ?Let us know if you need anything. ?

## 2021-08-20 DIAGNOSIS — E1165 Type 2 diabetes mellitus with hyperglycemia: Secondary | ICD-10-CM | POA: Diagnosis not present

## 2021-08-20 DIAGNOSIS — E559 Vitamin D deficiency, unspecified: Secondary | ICD-10-CM | POA: Diagnosis not present

## 2021-08-20 DIAGNOSIS — R5383 Other fatigue: Secondary | ICD-10-CM | POA: Diagnosis not present

## 2021-08-20 DIAGNOSIS — I10 Essential (primary) hypertension: Secondary | ICD-10-CM | POA: Diagnosis not present

## 2021-08-27 DIAGNOSIS — I10 Essential (primary) hypertension: Secondary | ICD-10-CM | POA: Diagnosis not present

## 2021-08-27 DIAGNOSIS — E1165 Type 2 diabetes mellitus with hyperglycemia: Secondary | ICD-10-CM | POA: Diagnosis not present

## 2021-08-27 DIAGNOSIS — E1140 Type 2 diabetes mellitus with diabetic neuropathy, unspecified: Secondary | ICD-10-CM | POA: Diagnosis not present

## 2021-08-27 DIAGNOSIS — E785 Hyperlipidemia, unspecified: Secondary | ICD-10-CM | POA: Diagnosis not present

## 2021-09-11 ENCOUNTER — Encounter: Payer: Self-pay | Admitting: Family Medicine

## 2021-09-11 DIAGNOSIS — M7989 Other specified soft tissue disorders: Secondary | ICD-10-CM

## 2021-09-14 ENCOUNTER — Other Ambulatory Visit: Payer: Self-pay | Admitting: Family Medicine

## 2021-09-14 DIAGNOSIS — Z0000 Encounter for general adult medical examination without abnormal findings: Secondary | ICD-10-CM

## 2021-09-16 ENCOUNTER — Ambulatory Visit (INDEPENDENT_AMBULATORY_CARE_PROVIDER_SITE_OTHER): Payer: Medicare Other | Admitting: Family Medicine

## 2021-09-16 ENCOUNTER — Encounter: Payer: Self-pay | Admitting: Family Medicine

## 2021-09-16 VITALS — BP 130/78 | HR 88 | Temp 97.8°F | Ht 72.0 in | Wt 247.2 lb

## 2021-09-16 DIAGNOSIS — Z0000 Encounter for general adult medical examination without abnormal findings: Secondary | ICD-10-CM | POA: Diagnosis not present

## 2021-09-16 DIAGNOSIS — M7989 Other specified soft tissue disorders: Secondary | ICD-10-CM

## 2021-09-16 NOTE — Patient Instructions (Addendum)
Keep the diet clean and stay active.  Give [place] 2-3 business days to get the results of your labs back.   Aim to do some physical exertion for 150 minutes per week. This is typically divided into 5 days per week, 30 minutes per day. The activity should be enough to get your heart rate up. Anything is better than nothing if you have time constraints.  Please get your second Shingles shot. Go to the pharmacy to get this.   Please reach out to your pharmacy regarding your tetanus shot as well.  Foods that may reduce pain: 1) Ginger 2) Blueberries 3) Salmon 4) Pumpkin seeds 5) dark chocolate 6) turmeric 7) tart cherries 8) virgin olive oil 9) chilli peppers 10) mint 11) krill oil  Let us know if you need anything.

## 2021-09-16 NOTE — Progress Notes (Signed)
Chief Complaint  Patient presents with   Annual Exam    Well Male John [name] is here for a complete physical.   His last physical was >1 year ago.  Current diet: in general, diet is OK.   Current exercise: very little Weight trend: stable Fatigue out of ordinary? No. Seat belt? Yes.   Advanced directive? Yes  Health maintenance Shingrix- Received first vaccine, will get second shortly at pharmacy.  Colonoscopy- Yes Tetanus- Due Hep C- Yes Pneumonia vaccine- Yes  Past Medical History:  Diagnosis Date   BPH (benign prostatic hyperplasia)    CTS (carpal tunnel syndrome)    Depression    Diabetes mellitus    Essential hypertension    GERD (gastroesophageal reflux disease)    History of breast cancer    Hypercholesteremia    Neuropathy      Past Surgical History:  Procedure Laterality Date   CATARACT EXTRACTION, BILATERAL  01/18/2017   LITHOTRIPSY     MASTECTOMY Right 05/02/2018   TONSILLECTOMY AND ADENOIDECTOMY      Medications  Current Outpatient Medications on File Prior to Visit  Medication Sig Dispense Refill   acetaminophen (TYLENOL) 650 MG CR tablet Take 1,300 mg by mouth every 8 (eight) hours as needed for pain.     amLODipine (NORVASC) 5 MG tablet TAKE 1 TABLET(5 MG) BY MOUTH DAILY 90 tablet 3   Ascorbic Acid (VITAMIN C) 1000 MG tablet Take 1,000 mg by mouth in the morning and at bedtime.     aspirin 325 MG EC tablet Take 325 mg by mouth daily.     diphenoxylate-atropine (LOMOTIL) 2.5-0.025 MG/5ML liquid Take 5 mLs by mouth 4 (four) times daily as needed for diarrhea or loose stools. 120 mL 0   furosemide (LASIX) 80 MG tablet Take 1 tablet (80 mg total) by mouth daily. 30 tablet 3   insulin glargine (LANTUS) 100 UNIT/ML injection Inject 0.65 mLs (65 Units total) into the skin daily. (Patient taking differently: Inject 80 Units into the skin daily.) 10 mL 11   Multiple Vitamin (MULTIVITAMIN) tablet Take 1 tablet by mouth daily.     nystatin cream  (MYCOSTATIN) Apply 1 application topically 2 (two) times daily. 30 g 0   omeprazole (PRILOSEC) 40 MG capsule TAKE 1 CAPSULE(40 MG) BY MOUTH DAILY 90 capsule 3   pravastatin (PRAVACHOL) 10 MG tablet TAKE 1 TABLET(10 MG) BY MOUTH DAILY 30 tablet 2   tamoxifen (NOLVADEX) 20 MG tablet Take 20 mg by mouth daily.     TOUJEO MAX SOLOSTAR 300 UNIT/ML Solostar Pen Inject 90 Units into the skin daily.     zinc sulfate 220 (50 Zn) MG capsule Take 1 capsule (220 mg total) by mouth daily. 30 capsule 1   Zoster Vaccine Adjuvanted [facility]) injection Inject into the muscle. 0.5 mL 1    Allergies Allergies  Allergen Reactions   Ramipril Anaphylaxis   Other Diarrhea    Severe intolerance to Chemotherapy in the past.   Sertraline Other (See Comments)    "Extreme headaches"   Adhesive [Tape] Rash    Family History Family History  Problem Relation Age of Onset   Lymphoma Mother    Cancer Mother    Diabetes Father    Stroke Father    Ovarian cancer Sister    Cancer Sister    Cancer Paternal Grandmother     Review of Systems: Constitutional:  no fevers Eye:  no recent significant change in vision Ears:  No changes in hearing Nose/Mouth/Throat:  no complaints of nasal congestion, no sore throat Cardiovascular: no chest pain Respiratory:  No shortness of breath Gastrointestinal:  No change in bowel habits GU:  No frequency Integumentary:  no abnormal skin lesions reported Neurologic:  no headaches Endocrine:  denies unexplained weight changes  Exam BP 130/78   Pulse 88   Temp 97.8 F (36.6 C) (Oral)   Ht 6' (1.829 m)   Wt 247 lb 4 oz (112.2 kg)   SpO2 97%   BMI 33.53 kg/m  General:  well developed, well nourished, in no apparent distress Skin:  no significant moles, warts, or growths Head:  no masses, lesions, or tenderness Eyes:  pupils equal and round, sclera anicteric without injection Ears:  canals without lesions, TMs shiny without retraction, no obvious effusion, no  erythema Nose:  nares patent, septum midline, mucosa normal Throat/Pharynx:  lips and gingiva without lesion; tongue and uvula midline; non-inflamed pharynx; no exudates or postnasal drainage Lungs:  clear to auscultation, breath sounds equal bilaterally, no respiratory distress Cardio:  regular rate and rhythm, 2+ pitting b/l LE edema tapering at mid tibia Rectal: Deferred GI: BS+, S, NT, ND, no masses or organomegaly Musculoskeletal:  symmetrical muscle groups noted without atrophy or deformity Neuro:  gait normal; deep tendon reflexes normal and symmetric Psych: well oriented with normal range of affect and appropriate judgment/insight  Assessment and Plan  Well adult exam  Localized swelling of both lower extremities   Well 76 y.o. male. Counseled on diet and exercise. Other orders as above. He will come back for labs, orders in for CBC, lipid panel, CMP.  Advanced directive form requested today.  2nd Shingrix rec'd.  Tdap rec'd.  Follow up in 6 mo or prn.  The patient voiced understanding and agreement to the plan.  [place], DO 09/16/21 1:40 PM

## 2021-09-21 MED ORDER — FUROSEMIDE 80 MG PO TABS: 80.0000 mg | ORAL_TABLET | Freq: Every day | ORAL | 3 refills | Status: DC

## 2021-10-06 ENCOUNTER — Other Ambulatory Visit (HOSPITAL_BASED_OUTPATIENT_CLINIC_OR_DEPARTMENT_OTHER): Payer: Self-pay

## 2021-10-06 ENCOUNTER — Other Ambulatory Visit: Payer: Self-pay | Admitting: Family Medicine

## 2021-10-06 ENCOUNTER — Encounter: Payer: Self-pay | Admitting: Family Medicine

## 2021-10-06 MED ORDER — ATORVASTATIN CALCIUM 40 MG PO TABS
40.0000 mg | ORAL_TABLET | Freq: Every day | ORAL | 2 refills | Status: DC
  Filled 2021-10-06: qty 90, 90d supply, fill #0

## 2021-11-24 DIAGNOSIS — Z08 Encounter for follow-up examination after completed treatment for malignant neoplasm: Secondary | ICD-10-CM | POA: Diagnosis not present

## 2021-11-24 DIAGNOSIS — Z85828 Personal history of other malignant neoplasm of skin: Secondary | ICD-10-CM | POA: Diagnosis not present

## 2021-11-24 DIAGNOSIS — Z1283 Encounter for screening for malignant neoplasm of skin: Secondary | ICD-10-CM | POA: Diagnosis not present

## 2021-11-24 DIAGNOSIS — L821 Other seborrheic keratosis: Secondary | ICD-10-CM | POA: Diagnosis not present

## 2021-12-01 ENCOUNTER — Encounter: Payer: Self-pay | Admitting: Family Medicine

## 2021-12-01 ENCOUNTER — Ambulatory Visit (INDEPENDENT_AMBULATORY_CARE_PROVIDER_SITE_OTHER): Payer: Medicare Other | Admitting: Family Medicine

## 2021-12-01 VITALS — BP 140/80 | HR 52 | Temp 97.9°F | Ht 72.0 in | Wt 247.0 lb

## 2021-12-01 DIAGNOSIS — M545 Low back pain, unspecified: Secondary | ICD-10-CM | POA: Diagnosis not present

## 2021-12-01 MED ORDER — METHOCARBAMOL 500 MG PO TABS: 500.0000 mg | ORAL_TABLET | Freq: Three times a day (TID) | ORAL | 0 refills | Status: DC | PRN

## 2021-12-01 NOTE — Progress Notes (Signed)
Musculoskeletal Exam  Patient: John [name] DOB: Oct 04, 1945  DOS: 12/01/2021  SUBJECTIVE:  Chief Complaint:   Chief Complaint  Patient presents with   Back Pain    John [name] is a 76 y.o.  male for evaluation and treatment of back pain.   Onset:  8 days ago. Thinks he jostled it while riding.  Location: left lower Character:  aching and sharp  Worse when he moves.  Progression of issue:  is unchanged Associated symptoms: no bruising, redness, swelling Denies bowel/bladder incontinence or weakness Treatment: to date has been OTC NSAIDS and muscle relaxers.   Neurovascular symptoms: no  Past Medical History:  Diagnosis Date   BPH (benign prostatic hyperplasia)    CTS (carpal tunnel syndrome)    Depression    Diabetes mellitus    Essential hypertension    GERD (gastroesophageal reflux disease)    History of breast cancer    Hypercholesteremia    Neuropathy     Objective:  VITAL SIGNS: BP (!) 140/80   Pulse (!) 52   Temp 97.9 F (36.6 C) (Oral)   Ht 6' (1.829 m)   Wt 247 lb (112 kg)   SpO2 97%   BMI 33.50 kg/m  Constitutional: Well formed, well developed. No acute distress. HENT: Normocephalic, atraumatic.  Thorax & Lungs:  No accessory muscle use Musculoskeletal: low back.   Tenderness to palpation: mild ttp over the L lumbar parasc msc Deformity: no Ecchymosis: no Straight leg test: negative for Poor hamstring flexibility b/l. Neurologic: Normal sensory function. No focal deficits noted. DTR's equal and symmetric in LE's. No clonus. Gait is cautious.  Psychiatric: Normal mood. Age appropriate judgment and insight. Alert & oriented x 3.    Assessment:  Acute left-sided low back pain without sciatica - Plan: methocarbamol (ROBAXIN) 500 MG tablet  Plan: Stretches/exercises, heat, ice, Tylenol, Robaxin. Did well with msc relaxer before.  F/u prn. The patient voiced understanding and agreement to the plan.   [place],  DO 12/01/21  9:46 AM

## 2021-12-01 NOTE — Patient Instructions (Addendum)
Ice/cold pack over area for 10-15 min twice daily.  OK to take Tylenol 1000 mg (2 extra strength tabs) or 975 mg (3 regular strength tabs) every 6 hours as needed.  Heat (pad or rice pillow in microwave) over affected area, 10-15 minutes twice daily.   Stop the Lasix and let me know if you need to go back on it for the swelling.   Let us know if you need anything.  EXERCISES  RANGE OF MOTION (ROM) AND STRETCHING EXERCISES - Low Back Pain Most people with lower back pain will find that their symptoms get worse with excessive bending forward (flexion) or arching at the lower back (extension). The exercises that will help resolve your symptoms will focus on the opposite motion.  If you have pain, numbness or tingling which travels down into your buttocks, leg or foot, the goal of the therapy is for these symptoms to move closer to your back and eventually resolve. Sometimes, these leg symptoms will get better, but your lower back pain may worsen. This is often an indication of progress in your rehabilitation. Be very alert to any changes in your symptoms and the activities in which you participated in the 24 hours prior to the change. Sharing this information with your caregiver will allow him or her to most efficiently treat your condition. These exercises may help you when beginning to rehabilitate your injury. Your symptoms may resolve with or without further involvement from your physician, physical therapist or athletic trainer. While completing these exercises, remember:  Restoring tissue flexibility helps normal motion to return to the joints. This allows healthier, less painful movement and activity. An effective stretch should be held for at least 30 seconds. A stretch should never be painful. You should only feel a gentle lengthening or release in the stretched tissue. FLEXION RANGE OF MOTION AND STRETCHING EXERCISES:  STRETCH - Flexion, Single Knee to Chest  Lie on a firm bed or floor  with both legs extended in front of you. Keeping one leg in contact with the floor, bring your opposite knee to your chest. Hold your leg in place by either grabbing behind your thigh or at your knee. Pull until you feel a gentle stretch in your low back. Hold 30 seconds. Slowly release your grasp and repeat the exercise with the opposite side. Repeat 2 times. Complete this exercise 3 times per week.   STRETCH - Flexion, Double Knee to Chest Lie on a firm bed or floor with both legs extended in front of you. Keeping one leg in contact with the floor, bring your opposite knee to your chest. Tense your stomach muscles to support your back and then lift your other knee to your chest. Hold your legs in place by either grabbing behind your thighs or at your knees. Pull both knees toward your chest until you feel a gentle stretch in your low back. Hold 30 seconds. Tense your stomach muscles and slowly return one leg at a time to the floor. Repeat 2 times. Complete this exercise 3 times per week.   STRETCH - Low Trunk Rotation Lie on a firm bed or floor. Keeping your legs in front of you, bend your knees so they are both pointed toward the ceiling and your feet are flat on the floor. Extend your arms out to the side. This will stabilize your upper body by keeping your shoulders in contact with the floor. Gently and slowly drop both knees together to one side until you feel  a gentle stretch in your low back. Hold for 30 seconds. Tense your stomach muscles to support your lower back as you bring your knees back to the starting position. Repeat the exercise to the other side. Repeat 2 times. Complete this exercise at least 3 times per week.   EXTENSION RANGE OF MOTION AND FLEXIBILITY EXERCISES:  STRETCH - Extension, Prone on Elbows  Lie on your stomach on the floor, a bed will be too soft. Place your palms about shoulder width apart and at the height of your head. Place your elbows under your  shoulders. If this is too painful, stack pillows under your chest. Allow your body to relax so that your hips drop lower and make contact more completely with the floor. Hold this position for 30 seconds. Slowly return to lying flat on the floor. Repeat 2 times. Complete this exercise 3 times per week.   RANGE OF MOTION - Extension, Prone Press Ups Lie on your stomach on the floor, a bed will be too soft. Place your palms about shoulder width apart and at the height of your head. Keeping your back as relaxed as possible, slowly straighten your elbows while keeping your hips on the floor. You may adjust the placement of your hands to maximize your comfort. As you gain motion, your hands will come more underneath your shoulders. Hold this position 30 seconds. Slowly return to lying flat on the floor. Repeat 2 times. Complete this exercise 3 times per week.   RANGE OF MOTION- Quadruped, Neutral Spine  Assume a hands and knees position on a firm surface. Keep your hands under your shoulders and your knees under your hips. You may place padding under your knees for comfort. Drop your head and point your tailbone toward the ground below you. This will round out your lower back like an angry cat. Hold this position for 30 seconds. Slowly lift your head and release your tail bone so that your back sags into a large arch, like an old horse. Hold this position for 30 seconds. Repeat this until you feel limber in your low back. Now, find your "sweet spot." This will be the most comfortable position somewhere between the two previous positions. This is your neutral spine. Once you have found this position, tense your stomach muscles to support your low back. Hold this position for 30 seconds. Repeat 2 times. Complete this exercise 3 times per week.   STRENGTHENING EXERCISES - Low Back Sprain These exercises may help you when beginning to rehabilitate your injury. These exercises should be done near your  "sweet spot." This is the neutral, low-back arch, somewhere between fully rounded and fully arched, that is your least painful position. When performed in this safe range of motion, these exercises can be used for people who have either a flexion or extension based injury. These exercises may resolve your symptoms with or without further involvement from your physician, physical therapist or athletic trainer. While completing these exercises, remember:  Muscles can gain both the endurance and the strength needed for everyday activities through controlled exercises. Complete these exercises as instructed by your physician, physical therapist or athletic trainer. Increase the resistance and repetitions only as guided. You may experience muscle soreness or fatigue, but the pain or discomfort you are trying to eliminate should never worsen during these exercises. If this pain does worsen, stop and make certain you are following the directions exactly. If the pain is still present after adjustments, discontinue the exercise until  you can discuss the trouble with your caregiver.  STRENGTHENING - Deep Abdominals, Pelvic Tilt  Lie on a firm bed or floor. Keeping your legs in front of you, bend your knees so they are both pointed toward the ceiling and your feet are flat on the floor. Tense your lower abdominal muscles to press your low back into the floor. This motion will rotate your pelvis so that your tail bone is scooping upwards rather than pointing at your feet or into the floor. With a gentle tension and even breathing, hold this position for 3 seconds. Repeat 2 times. Complete this exercise 3 times per week.   STRENGTHENING - Abdominals, Crunches  Lie on a firm bed or floor. Keeping your legs in front of you, bend your knees so they are both pointed toward the ceiling and your feet are flat on the floor. Cross your arms over your chest. Slightly tip your chin down without bending your neck. Tense your  abdominals and slowly lift your trunk high enough to just clear your shoulder blades. Lifting higher can put excessive stress on the lower back and does not further strengthen your abdominal muscles. Control your return to the starting position. Repeat 2 times. Complete this exercise 3 times per week.   STRENGTHENING - Quadruped, Opposite UE/LE Lift  Assume a hands and knees position on a firm surface. Keep your hands under your shoulders and your knees under your hips. You may place padding under your knees for comfort. Find your neutral spine and gently tense your abdominal muscles so that you can maintain this position. Your shoulders and hips should form a rectangle that is parallel with the floor and is not twisted. Keeping your trunk steady, lift your right hand no higher than your shoulder and then your left leg no higher than your hip. Make sure you are not holding your breath. Hold this position for 30 seconds. Continuing to keep your abdominal muscles tense and your back steady, slowly return to your starting position. Repeat with the opposite arm and leg. Repeat 2 times. Complete this exercise 3 times per week.   STRENGTHENING - Abdominals and Quadriceps, Straight Leg Raise  Lie on a firm bed or floor with both legs extended in front of you. Keeping one leg in contact with the floor, bend the other knee so that your foot can rest flat on the floor. Find your neutral spine, and tense your abdominal muscles to maintain your spinal position throughout the exercise. Slowly lift your straight leg off the floor about 6 inches for a count of 3, making sure to not hold your breath. Still keeping your neutral spine, slowly lower your leg all the way to the floor. Repeat this exercise with each leg 2 times. Complete this exercise 3 times per week.  POSTURE AND BODY MECHANICS CONSIDERATIONS - Low Back Sprain Keeping correct posture when sitting, standing or completing your activities will reduce  the stress put on different body tissues, allowing injured tissues a chance to heal and limiting painful experiences. The following are general guidelines for improved posture.  While reading these guidelines, remember: The exercises prescribed by your provider will help you have the flexibility and strength to maintain correct postures. The correct posture provides the best environment for your joints to work. All of your joints have less wear and tear when properly supported by a spine with good posture. This means you will experience a healthier, less painful body. Correct posture must be practiced with all  of your activities, especially prolonged sitting and standing. Correct posture is as important when doing repetitive low-stress activities (typing) as it is when doing a single heavy-load activity (lifting).  RESTING POSITIONS Consider which positions are most painful for you when choosing a resting position. If you have pain with flexion-based activities (sitting, bending, stooping, squatting), choose a position that allows you to rest in a less flexed posture. You would want to avoid curling into a fetal position on your side. If your pain worsens with extension-based activities (prolonged standing, working overhead), avoid resting in an extended position such as sleeping on your stomach. Most people will find more comfort when they rest with their spine in a more neutral position, neither too rounded nor too arched. Lying on a non-sagging bed on your side with a pillow between your knees, or on your back with a pillow under your knees will often provide some relief. Keep in mind, being in any one position for a prolonged period of time, no matter how correct your posture, can still lead to stiffness.  PROPER SITTING POSTURE In order to minimize stress and discomfort on your spine, you must sit with correct posture. Sitting with good posture should be effortless for a healthy body. Returning to good  posture is a gradual process. Many people can work toward this most comfortably by using various supports until they have the flexibility and strength to maintain this posture on their own. When sitting with proper posture, your ears will fall over your shoulders and your shoulders will fall over your hips. You should use the back of the chair to support your upper back. Your lower back will be in a neutral position, just slightly arched. You may place a small pillow or folded towel at the base of your lower back for  support.  When working at a desk, create an environment that supports good, upright posture. Without extra support, muscles tire, which leads to excessive strain on joints and other tissues. Keep these recommendations in mind:  CHAIR: A chair should be able to slide under your desk when your back makes contact with the back of the chair. This allows you to work closely. The chair's height should allow your eyes to be level with the upper part of your monitor and your hands to be slightly lower than your elbows.  BODY POSITION Your feet should make contact with the floor. If this is not possible, use a foot rest. Keep your ears over your shoulders. This will reduce stress on your neck and low back.  INCORRECT SITTING POSTURES  If you are feeling tired and unable to assume a healthy sitting posture, do not slouch or slump. This puts excessive strain on your back tissues, causing more damage and pain. Healthier options include: Using more support, like a lumbar pillow. Switching tasks to something that requires you to be upright or walking. Talking a brief walk. Lying down to rest in a neutral-spine position.  PROLONGED STANDING WHILE SLIGHTLY LEANING FORWARD  When completing a task that requires you to lean forward while standing in one place for a long time, place either foot up on a stationary 2-4 inch high object to help maintain the best posture. When both feet are on the ground,  the lower back tends to lose its slight inward curve. If this curve flattens (or becomes too large), then the back and your other joints will experience too much stress, tire more quickly, and can cause pain.  CORRECT STANDING  POSTURES Proper standing posture should be assumed with all daily activities, even if they only take a few moments, like when brushing your teeth. As in sitting, your ears should fall over your shoulders and your shoulders should fall over your hips. You should keep a slight tension in your abdominal muscles to brace your spine. Your tailbone should point down to the ground, not behind your body, resulting in an over-extended swayback posture.   INCORRECT STANDING POSTURES  Common incorrect standing postures include a forward head, locked knees and/or an excessive swayback. WALKING Walk with an upright posture. Your ears, shoulders and hips should all line-up.  PROLONGED ACTIVITY IN A FLEXED POSITION When completing a task that requires you to bend forward at your waist or lean over a low surface, try to find a way to stabilize 3 out of 4 of your limbs. You can place a hand or elbow on your thigh or rest a knee on the surface you are reaching across. This will provide you more stability, so that your muscles do not tire as quickly. By keeping your knees relaxed, or slightly bent, you will also reduce stress across your lower back. CORRECT LIFTING TECHNIQUES  DO : Assume a wide stance. This will provide you more stability and the opportunity to get as close as possible to the object which you are lifting. Tense your abdominals to brace your spine. Bend at the knees and hips. Keeping your back locked in a neutral-spine position, lift using your leg muscles. Lift with your legs, keeping your back straight. Test the weight of unknown objects before attempting to lift them. Try to keep your elbows locked down at your sides in order get the best strength from your shoulders when  carrying an object.   Always ask for help when lifting heavy or awkward objects. INCORRECT LIFTING TECHNIQUES DO NOT:  Lock your knees when lifting, even if it is a small object. Bend and twist. Pivot at your feet or move your feet when needing to change directions. Assume that you can safely pick up even a paperclip without proper posture.

## 2021-12-03 ENCOUNTER — Other Ambulatory Visit: Payer: Medicare Other

## 2021-12-09 DIAGNOSIS — C50921 Malignant neoplasm of unspecified site of right male breast: Secondary | ICD-10-CM | POA: Diagnosis not present

## 2021-12-09 DIAGNOSIS — F325 Major depressive disorder, single episode, in full remission: Secondary | ICD-10-CM | POA: Diagnosis not present

## 2021-12-09 DIAGNOSIS — E1165 Type 2 diabetes mellitus with hyperglycemia: Secondary | ICD-10-CM | POA: Diagnosis not present

## 2021-12-09 DIAGNOSIS — E1140 Type 2 diabetes mellitus with diabetic neuropathy, unspecified: Secondary | ICD-10-CM | POA: Diagnosis not present

## 2021-12-09 DIAGNOSIS — N401 Enlarged prostate with lower urinary tract symptoms: Secondary | ICD-10-CM | POA: Diagnosis not present

## 2021-12-22 DIAGNOSIS — X32XXXD Exposure to sunlight, subsequent encounter: Secondary | ICD-10-CM | POA: Diagnosis not present

## 2021-12-22 DIAGNOSIS — Z08 Encounter for follow-up examination after completed treatment for malignant neoplasm: Secondary | ICD-10-CM | POA: Diagnosis not present

## 2021-12-22 DIAGNOSIS — L570 Actinic keratosis: Secondary | ICD-10-CM | POA: Diagnosis not present

## 2021-12-22 DIAGNOSIS — Z85828 Personal history of other malignant neoplasm of skin: Secondary | ICD-10-CM | POA: Diagnosis not present

## 2021-12-30 ENCOUNTER — Encounter: Payer: Self-pay | Admitting: Family Medicine

## 2021-12-30 ENCOUNTER — Ambulatory Visit: Payer: Medicare Other

## 2021-12-30 DIAGNOSIS — Z23 Encounter for immunization: Secondary | ICD-10-CM

## 2021-12-30 NOTE — Addendum Note (Signed)
Addended by: Laure Kidney on: 12/30/2021 01:41 PM   Modules accepted: Orders

## 2021-12-30 NOTE — Progress Notes (Addendum)
Barnes Florek is a 76 y.o. male presents to the office today for Influenza  injections, per physician's orders. Original order:  left deltoid  (route) was administered  (location) today. Patient tolerated injection.  Patient next injection due:   Nataliee Shurtz M Gelsey Amyx

## 2022-01-08 DIAGNOSIS — Z853 Personal history of malignant neoplasm of breast: Secondary | ICD-10-CM | POA: Diagnosis not present

## 2022-01-08 DIAGNOSIS — Z08 Encounter for follow-up examination after completed treatment for malignant neoplasm: Secondary | ICD-10-CM | POA: Diagnosis not present

## 2022-01-19 ENCOUNTER — Ambulatory Visit (INDEPENDENT_AMBULATORY_CARE_PROVIDER_SITE_OTHER): Payer: Medicare Other

## 2022-01-19 VITALS — Wt 247.0 lb

## 2022-01-19 DIAGNOSIS — Z0000 Encounter for general adult medical examination without abnormal findings: Secondary | ICD-10-CM | POA: Diagnosis not present

## 2022-01-19 NOTE — Progress Notes (Addendum)
I connected with  John [name] on 01/19/22 by a audio enabled telemedicine application and verified that I am speaking with the correct person using two identifiers.  Patient Location: Home  Provider Location: Office/Clinic  I discussed the limitations of evaluation and management by telemedicine. The patient expressed understanding and agreed to proceed.   Subjective:   John [name] is a 76 y.o. male who presents for Medicare Annual/Subsequent preventive examination.  Review of Systems     Cardiac Risk Factors include: advanced age (>83mn, >>61women);dyslipidemia;hypertension;male gender;obesity (BMI >30kg/m2);sedentary lifestyle     Objective:    Today's Vitals   01/19/22 1332  Weight: 247 lb (112 kg)   Body mass index is 33.5 kg/m.     01/19/2022    1:46 PM 12/30/2020    1:43 PM 10/26/2019    2:38 PM 10/26/2019   11:17 AM 10/26/2019   11:16 AM  Advanced Directives  Does Patient Have a Medical Advance Directive? Yes Yes Yes Yes No  Type of [place] will   Does patient want to make changes to medical advance directive?   No - Patient declined    Copy of [place] Chart? No - copy requested No - copy requested No - copy requested      Current Medications (verified) Outpatient Encounter Medications as of 01/19/2022  Medication Sig   acetaminophen (TYLENOL) 650 MG CR tablet Take 1,300 mg by mouth every 8 (eight) hours as needed for pain.   amLODipine (NORVASC) 5 MG tablet TAKE 1 TABLET(5 MG) BY MOUTH DAILY   Ascorbic Acid (VITAMIN C) 1000 MG tablet Take 1,000 mg by mouth in the morning and at bedtime.   aspirin 325 MG EC tablet Take 325 mg by mouth daily.   atorvastatin (LIPITOR) 40 MG tablet Take 1 tablet (40 mg total) by mouth daily.   furosemide (LASIX) 80 MG tablet Take 80 mg  by mouth daily.   metFORMIN (GLUCOPHAGE-XR) 500 MG 24 hr tablet Take 500 mg by mouth 3 (three) times daily.   methocarbamol (ROBAXIN) 500 MG tablet Take 1 tablet (500 mg total) by mouth every 8 (eight) hours as needed for muscle spasms.   Multiple Vitamin (MULTIVITAMIN) tablet Take 1 tablet by mouth daily.   omeprazole (PRILOSEC) 40 MG capsule TAKE 1 CAPSULE(40 MG) BY MOUTH DAILY   tamoxifen (NOLVADEX) 20 MG tablet Take 20 mg by mouth daily.   TOUJEO MAX SOLOSTAR 300 UNIT/ML Solostar Pen Inject 90 Units into the skin daily.   zinc sulfate 220 (50 Zn) MG capsule Take 1 capsule (220 mg total) by mouth daily.   Zoster Vaccine Adjuvanted ([facility] injection Inject into the muscle.   [DISCONTINUED] diphenoxylate-atropine (LOMOTIL) 2.5-0.025 MG/5ML liquid Take 5 mLs by mouth 4 (four) times daily as needed for diarrhea or loose stools.   [DISCONTINUED] insulin glargine (LANTUS) 100 UNIT/ML injection Inject 0.65 mLs (65 Units total) into the skin daily. (Patient taking differently: Inject 80 Units into the skin daily.)   [DISCONTINUED] nystatin cream (MYCOSTATIN) Apply 1 application topically 2 (two) times daily.   No facility-administered encounter medications on file as of 01/19/2022.    Allergies (verified) Ramipril, Other, Sertraline, and Adhesive [tape]   History: Past Medical History:  Diagnosis Date   BPH (benign prostatic hyperplasia)    CTS (carpal tunnel syndrome)    Depression    Diabetes mellitus  Essential hypertension    GERD (gastroesophageal reflux disease)    History of breast cancer    Hypercholesteremia    Neuropathy    Past Surgical History:  Procedure Laterality Date   CATARACT EXTRACTION, BILATERAL  01/18/2017   LITHOTRIPSY     MASTECTOMY Right 05/02/2018   TONSILLECTOMY AND ADENOIDECTOMY     Family History  Problem Relation Age of Onset   Lymphoma Mother    Cancer Mother    Diabetes Father    Stroke Father    Ovarian cancer Sister    Cancer Sister     Cancer Paternal Grandmother    Social History   Socioeconomic History   Marital status: Married    Spouse name: Not on file   Number of children: Not on file   Years of education: Not on file   Highest education level: Not on file  Occupational History   Not on file  Tobacco Use   Smoking status: Never   Smokeless tobacco: Never  Substance and Sexual Activity   Alcohol use: No   Drug use: No   Sexual activity: Not Currently    Partners: Female  Other Topics Concern   Not on file  Social History Narrative   Not on file   Social Determinants of Health   Financial Resource Strain: Low Risk  (01/19/2022)   Overall Financial Resource Strain (CARDIA)    Difficulty of Paying Living Expenses: Not hard at all  Food Insecurity: No Food Insecurity (01/19/2022)   Hunger Vital Sign    Worried About Running Out of Food in the Last Year: Never true    [place] in the Last Year: Never true  Transportation Needs: No Transportation Needs (01/19/2022)   PRAPARE - Hydrologist (Medical): No    Lack of Transportation (Non-Medical): No  Physical Activity: Inactive (01/19/2022)   Exercise Vital Sign    Days of Exercise per Week: 0 days    Minutes of Exercise per Session: 0 min  Stress: No Stress Concern Present (01/19/2022)   Chloride    Feeling of Stress : Not at all  Social Connections: Socially Isolated (01/19/2022)   Social Connection and Isolation Panel [NHANES]    Frequency of Communication with Friends and Family: Once a week    Frequency of Social Gatherings with Friends and Family: Once a week    Attends Religious Services: Never    Marine scientist or Organizations: No    Attends Music therapist: Never    Marital Status: Married    Tobacco Counseling Counseling given: Not Answered   Clinical Intake:  Pre-visit preparation completed: Yes  Pain :  No/denies pain     BMI - recorded: 33.5 Nutritional Status: BMI > 30  Obese Nutritional Risks: None Diabetes: No  How often do you need to have someone help you when you read instructions, pamphlets, or other written materials from your doctor or pharmacy?: 1 - Never  Diabetic?no  Interpreter Needed?: No  Information entered by :: Charlott Rakes, LPN   Activities of Daily Living    01/19/2022    1:47 PM 01/18/2022    2:35 PM  In your present state of health, do you have any difficulty performing the following activities:  Hearing? 0 0  Vision? 0 0  Difficulty concentrating or making decisions? 0 0  Walking or climbing stairs? 0 0  Dressing or bathing? 0  0  Doing errands, shopping? 0 0  Preparing Food and eating ? N N  Using the Toilet? N N  In the past six months, have you accidently leaked urine? N N  Do you have problems with loss of bowel control? N N  Managing your Medications? N N  Managing your Finances? N N  Housekeeping or managing your Housekeeping? N N    Patient Care Team: Shelda Pal, DO as PCP - General (Family Medicine)  Indicate any recent Medical Services you may have received from other than [facility] providers in the past year (date may be approximate).     Assessment:   This is a routine wellness examination for John [name].  Hearing/Vision screen Hearing Screening - Comments:: Pt wears hearing aids  Vision Screening - Comments:: Pt follow up with  Dr Tora Perches for annul eye exams   Dietary issues and exercise activities discussed: Current Exercise Habits: The patient does not participate in regular exercise at present   Goals Addressed             This Visit's Progress    Patient Stated       None at this time        Depression Screen    01/19/2022    1:45 PM 09/16/2021    1:00 PM 01/13/2021    5:42 PM 12/30/2020    1:48 PM 09/10/2020    9:06 AM 12/21/2019    2:37 PM  PHQ 2/9 Scores  PHQ - 2 Score 0 0 0 0 0 0  PHQ- 9 Score  3     0    Fall Risk    01/19/2022    1:47 PM 01/18/2022    2:35 PM 09/16/2021   12:59 PM 01/13/2021    5:42 PM 12/30/2020    1:46 PM  [place] in the past year? 0 0 0 0 0  Number falls in past yr: 0 0 0 0 0  Injury with Fall? 0 0 0 0 0  Risk for fall due to : Impaired vision;Impaired balance/gait  No Fall Risks No Fall Risks   Follow up Falls prevention discussed  Falls evaluation completed Falls evaluation completed Falls prevention discussed    FALL RISK PREVENTION PERTAINING TO THE HOME:  Any stairs in or around the home? Yes  If so, are there any without handrails? No  Home free of loose throw rugs in walkways, pet beds, electrical cords, etc? Yes  Adequate lighting in your home to reduce risk of falls? Yes   ASSISTIVE DEVICES UTILIZED TO PREVENT FALLS:  Life alert? Yes  apple watch  Use of a cane, walker or w/c? No  Grab bars in the bathroom? No  Shower chair or bench in shower? No  Elevated toilet seat or a handicapped toilet? No   TIMED UP AND GO:  Was the test performed? No .   Cognitive Function:        01/19/2022    1:49 PM  6CIT Screen  What Year? 0 points  What month? 0 points  What time? 0 points  Count back from 20 0 points  Months in reverse 0 points  Repeat phrase 0 points  Total Score 0 points    Immunizations Immunization History  Administered Date(s) Administered   Fluad Quad(high Dose 65+) 01/20/2021, 12/30/2021   Influenza Split 08/04/2014   Influenza, High Dose Seasonal PF 12/04/2014, 12/17/2015, 12/15/2016, 12/14/2017, 11/27/2018, 11/28/2018, 01/16/2020   Influenza,inj,quad, With Preservative 12/17/2015  Influenza-Unspecified 08/04/2014, 12/17/2015   PFIZER Comirnaty(Gray Top)Covid-19 Tri-Sucrose Vaccine 05/10/2019, 06/05/2019, 02/04/2020   PFIZER(Purple Top)SARS-COV-2 Vaccination 05/10/2019, 06/05/2019, 02/04/2020   Pneumococcal Conjugate-13 07/18/2013   Pneumococcal Polysaccharide-23 09/24/2010, 03/10/2020   Tdap 07/30/2011    Zoster Recombinat (Shingrix) 05/22/2021   Zoster, Live 07/27/2010    TDAP status: Due, Education has been provided regarding the importance of this vaccine. Advised may receive this vaccine at local pharmacy or Health Dept. Aware to provide a copy of the vaccination record if obtained from local pharmacy or Health Dept. Verbalized acceptance and understanding.  Flu Vaccine status: Up to date  Pneumococcal vaccine status: Up to date  Covid-19 vaccine status: Completed vaccines  Qualifies for Shingles Vaccine? Yes   Zostavax completed Yes   Shingrix Completed?: Yes  Screening Tests Health Maintenance  Topic Date Due   Zoster Vaccines- Shingrix (2 of 2) 07/17/2021   TETANUS/TDAP  03/18/2022 (Originally 07/29/2021)   COLONOSCOPY (Pts 45-9yr Insurance coverage will need to be confirmed)  03/27/2028   Pneumonia Vaccine 75 Years old  Completed   INFLUENZA VACCINE  Completed   Hepatitis C Screening  Completed   HPV VACCINES  Aged Out   COVID-19 Vaccine  Discontinued    Health Maintenance  Health Maintenance Due  Topic Date Due   Zoster Vaccines- Shingrix (2 of 2) 07/17/2021    Colorectal cancer screening: Type of screening: Colonoscopy. Completed 03/27/18. Repeat every 10 years  Additional Screening:  Hepatitis C Screening: Completed 09/10/20  Vision Screening: Recommended annual ophthalmology exams for early detection of glaucoma and other disorders of the eye. Is the patient up to date with their annual eye exam?  Yes  Who is the provider or what is the name of the office in which the patient attends annual eye exams? Dr STora Perches If pt is not established with a provider, would they like to be referred to a provider to establish care? No .   Dental Screening: Recommended annual dental exams for proper oral hygiene  Community Resource Referral / Chronic Care Management: CRR required this visit?  No   CCM required this visit?  No      Plan:     I have personally  reviewed and noted the following in the patient's chart:   Medical and social history Use of alcohol, tobacco or illicit drugs  Current medications and supplements including opioid prescriptions. Patient is not currently taking opioid prescriptions. Functional ability and status Nutritional status Physical activity Advanced directives List of other physicians Hospitalizations, surgeries, and ER visits in previous 12 months Vitals Screenings to include cognitive, depression, and falls Referrals and appointments  In addition, I have reviewed and discussed with patient certain preventive protocols, quality metrics, and best practice recommendations. A written personalized care plan for preventive services as well as general preventive health recommendations were provided to patient.     TWillette Brace LPN   [id number]  Nurse Notes: None

## 2022-01-19 NOTE — Patient Instructions (Signed)
[name] , Thank you for taking time to come for your Medicare Wellness Visit. I appreciate your ongoing commitment to your health goals. Please review the following plan we discussed and let me know if I can assist you in the future.   These are the goals we discussed:  Goals      Patient Stated     Increase activity as tolerated     Patient Stated     None at this time         This is a list of the screening recommended for you and due dates:  Health Maintenance  Topic Date Due   Zoster (Shingles) Vaccine (2 of 2) 07/17/2021   Tetanus Vaccine  03/18/2022*   Colon Cancer Screening  03/27/2028   Pneumonia Vaccine  Completed   Flu Shot  Completed   Hepatitis C Screening: USPSTF Recommendation to screen - Ages 18-79 yo.  Completed   HPV Vaccine  Aged Out   COVID-19 Vaccine  Discontinued  *Topic was postponed. The date shown is not the original due date.    Advanced directives: Please bring a copy of your health care power of attorney and living will to the office at your convenience.  Conditions/risks identified: none at this time   Next appointment: Follow up in one year for your annual wellness visit.   Preventive Care 75 Years and Older, Male  Preventive care refers to lifestyle choices and visits with your health care provider that can promote health and wellness. What does preventive care include? A yearly physical exam. This is also called an annual well check. Dental exams once or twice a year. Routine eye exams. Ask your health care provider how often you should have your eyes checked. Personal lifestyle choices, including: Daily care of your teeth and gums. Regular physical activity. Eating a healthy diet. Avoiding tobacco and drug use. Limiting alcohol use. Practicing safe sex. Taking low doses of aspirin every day. Taking vitamin and mineral supplements as recommended by your health care provider. What happens during an annual well check? The services and  screenings done by your health care provider during your annual well check will depend on your age, overall health, lifestyle risk factors, and family history of disease. Counseling  Your health care provider may ask you questions about your: Alcohol use. Tobacco use. Drug use. Emotional well-being. Home and relationship well-being. Sexual activity. Eating habits. History of falls. Memory and ability to understand (cognition). Work and work Statistician. Screening  You may have the following tests or measurements: Height, weight, and BMI. Blood pressure. Lipid and cholesterol levels. These may be checked every 5 years, or more frequently if you are over 75 years old. Skin check. Lung cancer screening. You may have this screening every year starting at age 65 if you have a 30-pack-year history of smoking and currently smoke or have quit within the past 15 years. Fecal occult blood test (FOBT) of the stool. You may have this test every year starting at age 75. Flexible sigmoidoscopy or colonoscopy. You may have a sigmoidoscopy every 5 years or a colonoscopy every 10 years starting at age 75. Prostate cancer screening. Recommendations will vary depending on your family history and other risks. Hepatitis C blood test. Hepatitis B blood test. Sexually transmitted disease (STD) testing. Diabetes screening. This is done by checking your blood sugar (glucose) after you have not eaten for a while (fasting). You may have this done every 1-3 years. Abdominal aortic aneurysm (AAA) screening.  You may need this if you are a current or former smoker. Osteoporosis. You may be screened starting at age 75 if you are at high risk. Talk with your health care provider about your test results, treatment options, and if necessary, the need for more tests. Vaccines  Your health care provider may recommend certain vaccines, such as: Influenza vaccine. This is recommended every year. Tetanus, diphtheria, and  acellular pertussis (Tdap, Td) vaccine. You may need a Td booster every 10 years. Zoster vaccine. You may need this after age 35. Pneumococcal 13-valent conjugate (PCV13) vaccine. One dose is recommended after age 75. Pneumococcal polysaccharide (PPSV23) vaccine. One dose is recommended after age 75. Talk to your health care provider about which screenings and vaccines you need and how often you need them. This information is not intended to replace advice given to you by your health care provider. Make sure you discuss any questions you have with your health care provider. Document Released: 04/25/2015 Document Revised: 12/17/2015 Document Reviewed: 01/28/2015 Elsevier Interactive Patient Education  2017 [place] Prevention in the Home Falls can cause injuries. They can happen to people of all ages. There are many things you can do to make your home safe and to help prevent falls. What can I do on the outside of my home? Regularly fix the edges of walkways and driveways and fix any cracks. Remove anything that might make you trip as you walk through a door, such as a raised step or threshold. Trim any bushes or trees on the path to your home. Use bright outdoor lighting. Clear any walking paths of anything that might make someone trip, such as rocks or tools. Regularly check to see if handrails are loose or broken. Make sure that both sides of any steps have handrails. Any raised decks and porches should have guardrails on the edges. Have any leaves, snow, or ice cleared regularly. Use sand or salt on walking paths during winter. Clean up any spills in your garage right away. This includes oil or grease spills. What can I do in the bathroom? Use night lights. Install grab bars by the toilet and in the tub and shower. Do not use towel bars as grab bars. Use non-skid mats or decals in the tub or shower. If you need to sit down in the shower, use a plastic, non-slip stool. Keep  the floor dry. Clean up any water that spills on the floor as soon as it happens. Remove soap buildup in the tub or shower regularly. Attach bath mats securely with double-sided non-slip rug tape. Do not have throw rugs and other things on the floor that can make you trip. What can I do in the bedroom? Use night lights. Make sure that you have a light by your bed that is easy to reach. Do not use any sheets or blankets that are too big for your bed. They should not hang down onto the floor. Have a firm chair that has side arms. You can use this for support while you get dressed. Do not have throw rugs and other things on the floor that can make you trip. What can I do in the kitchen? Clean up any spills right away. Avoid walking on wet floors. Keep items that you use a lot in easy-to-reach places. If you need to reach something above you, use a strong step stool that has a grab bar. Keep electrical cords out of the way. Do not use floor polish or wax  that makes floors slippery. If you must use wax, use non-skid floor wax. Do not have throw rugs and other things on the floor that can make you trip. What can I do with my stairs? Do not leave any items on the stairs. Make sure that there are handrails on both sides of the stairs and use them. Fix handrails that are broken or loose. Make sure that handrails are as long as the stairways. Check any carpeting to make sure that it is firmly attached to the stairs. Fix any carpet that is loose or worn. Avoid having throw rugs at the top or bottom of the stairs. If you do have throw rugs, attach them to the floor with carpet tape. Make sure that you have a light switch at the top of the stairs and the bottom of the stairs. If you do not have them, ask someone to add them for you. What else can I do to help prevent falls? Wear shoes that: Do not have high heels. Have rubber bottoms. Are comfortable and fit you well. Are closed at the toe. Do not  wear sandals. If you use a stepladder: Make sure that it is fully opened. Do not climb a closed stepladder. Make sure that both sides of the stepladder are locked into place. Ask someone to hold it for you, if possible. Clearly mark and make sure that you can see: Any grab bars or handrails. First and last steps. Where the edge of each step is. Use tools that help you move around (mobility aids) if they are needed. These include: Canes. Walkers. Scooters. Crutches. Turn on the lights when you go into a dark area. Replace any light bulbs as soon as they burn out. Set up your furniture so you have a clear path. Avoid moving your furniture around. If any of your floors are uneven, fix them. If there are any pets around you, be aware of where they are. Review your medicines with your doctor. Some medicines can make you feel dizzy. This can increase your chance of falling. Ask your doctor what other things that you can do to help prevent falls. This information is not intended to replace advice given to you by your health care provider. Make sure you discuss any questions you have with your health care provider. Document Released: 01/23/2009 Document Revised: 09/04/2015 Document Reviewed: 05/03/2014 Elsevier Interactive Patient Education  2017 [employer].

## 2022-01-28 DIAGNOSIS — R928 Other abnormal and inconclusive findings on diagnostic imaging of breast: Secondary | ICD-10-CM | POA: Diagnosis not present

## 2022-01-28 DIAGNOSIS — Z853 Personal history of malignant neoplasm of breast: Secondary | ICD-10-CM | POA: Diagnosis not present

## 2022-02-15 ENCOUNTER — Other Ambulatory Visit (HOSPITAL_BASED_OUTPATIENT_CLINIC_OR_DEPARTMENT_OTHER): Payer: Self-pay

## 2022-02-15 ENCOUNTER — Encounter: Payer: Self-pay | Admitting: Family Medicine

## 2022-02-18 DIAGNOSIS — D0511 Intraductal carcinoma in situ of right breast: Secondary | ICD-10-CM | POA: Diagnosis not present

## 2022-02-18 DIAGNOSIS — Z510 Encounter for antineoplastic radiation therapy: Secondary | ICD-10-CM | POA: Diagnosis not present

## 2022-02-18 DIAGNOSIS — Z5181 Encounter for therapeutic drug level monitoring: Secondary | ICD-10-CM | POA: Diagnosis not present

## 2022-02-18 DIAGNOSIS — C50921 Malignant neoplasm of unspecified site of right male breast: Secondary | ICD-10-CM | POA: Diagnosis not present

## 2022-02-18 DIAGNOSIS — Z170 Estrogen receptor positive status [ER+]: Secondary | ICD-10-CM | POA: Diagnosis not present

## 2022-02-18 DIAGNOSIS — Z79810 Long term (current) use of selective estrogen receptor modulators (SERMs): Secondary | ICD-10-CM | POA: Diagnosis not present

## 2022-02-18 DIAGNOSIS — R5383 Other fatigue: Secondary | ICD-10-CM | POA: Diagnosis not present

## 2022-02-18 DIAGNOSIS — Z9221 Personal history of antineoplastic chemotherapy: Secondary | ICD-10-CM | POA: Diagnosis not present

## 2022-02-22 DIAGNOSIS — Z794 Long term (current) use of insulin: Secondary | ICD-10-CM | POA: Diagnosis not present

## 2022-02-22 DIAGNOSIS — Z7984 Long term (current) use of oral hypoglycemic drugs: Secondary | ICD-10-CM | POA: Diagnosis not present

## 2022-02-22 DIAGNOSIS — E119 Type 2 diabetes mellitus without complications: Secondary | ICD-10-CM | POA: Diagnosis not present

## 2022-02-22 DIAGNOSIS — L03031 Cellulitis of right toe: Secondary | ICD-10-CM | POA: Diagnosis not present

## 2022-03-08 MED ORDER — ATORVASTATIN CALCIUM 40 MG PO TABS: 40.0000 mg | ORAL_TABLET | Freq: Every day | ORAL | 3 refills | Status: DC

## 2022-03-10 ENCOUNTER — Telehealth: Payer: Self-pay | Admitting: Pharmacist

## 2022-03-10 NOTE — Telephone Encounter (Signed)
Patient has on Medicare Gap list as not having A1c checked in 2023. He previously was seeing Dr Meredith Pel. I left a message to see when he last saw her and what A1c was.  It looks like Dr Nani Ravens was planning to refer to another endo since Dr Meredith Pel has moved.  LM on VM asking patient to call me back. If needed can order A1c check and also send endo referral to office of his choice.

## 2022-03-11 DIAGNOSIS — E1165 Type 2 diabetes mellitus with hyperglycemia: Secondary | ICD-10-CM | POA: Diagnosis not present

## 2022-03-11 DIAGNOSIS — E1140 Type 2 diabetes mellitus with diabetic neuropathy, unspecified: Secondary | ICD-10-CM | POA: Diagnosis not present

## 2022-03-11 DIAGNOSIS — E538 Deficiency of other specified B group vitamins: Secondary | ICD-10-CM | POA: Diagnosis not present

## 2022-03-11 DIAGNOSIS — F325 Major depressive disorder, single episode, in full remission: Secondary | ICD-10-CM | POA: Diagnosis not present

## 2022-03-11 DIAGNOSIS — C50921 Malignant neoplasm of unspecified site of right male breast: Secondary | ICD-10-CM | POA: Diagnosis not present

## 2022-03-27 IMAGING — CT CT CHEST W/ CM
2 of 3 series · 15 of 36 positions shown, 18 images · IV contrast (omnipaque)
Comparison: None.

CLINICAL DATA: Pneumonia, pleural effusion, JJNTX-3U positive

EXAM:
CT CHEST WITH CONTRAST
TECHNIQUE: Multidetector CT imaging of the chest was performed during
intravenous contrast administration.
CONTRAST:  75mL OMNIPAQUE IOHEXOL 300 MG/ML  SOLN

[Series 2: axial st · axial · 0.69mm/px · z∈[+1299,+1563]mm · 12 of 156 slices shown, 15 images]
[im 12/156  mediastinal]
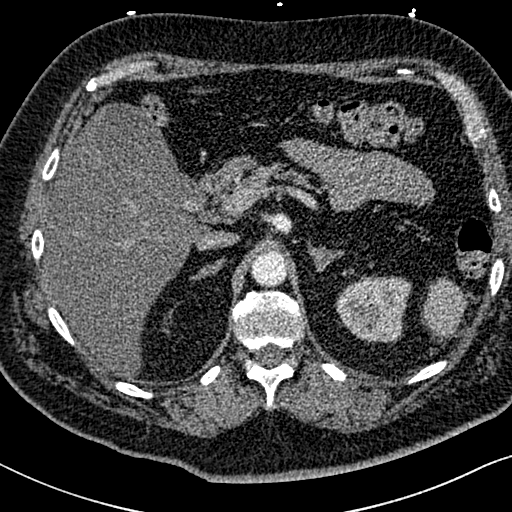
[im 12/156  lung]
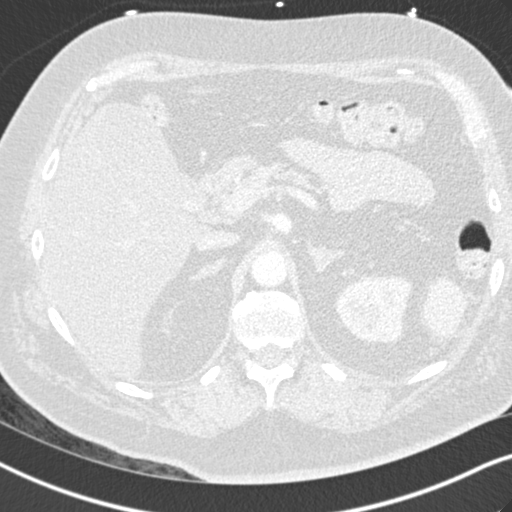
[im 23/156  lung]
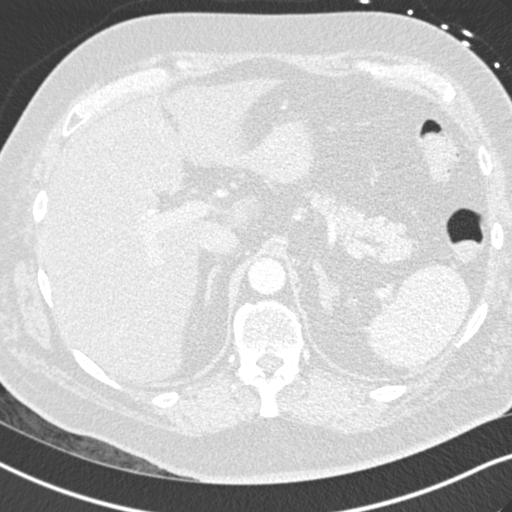
[im 35/156  lung]
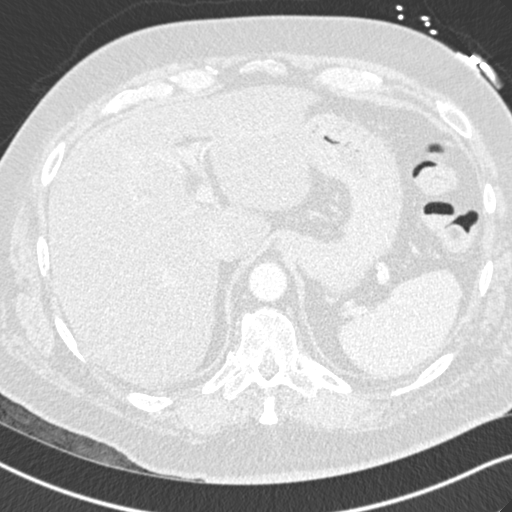
[im 46/156  lung]
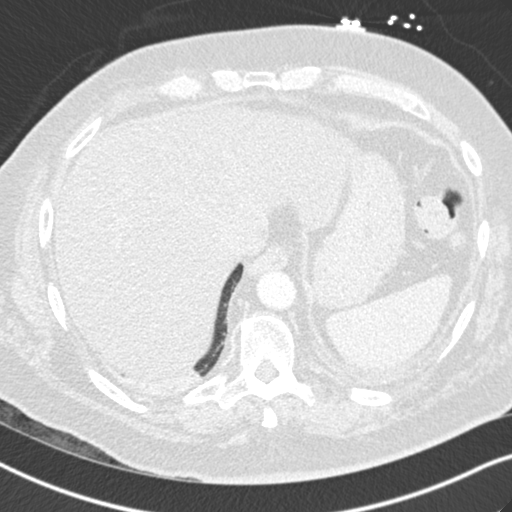
[im 58/156  mediastinal]
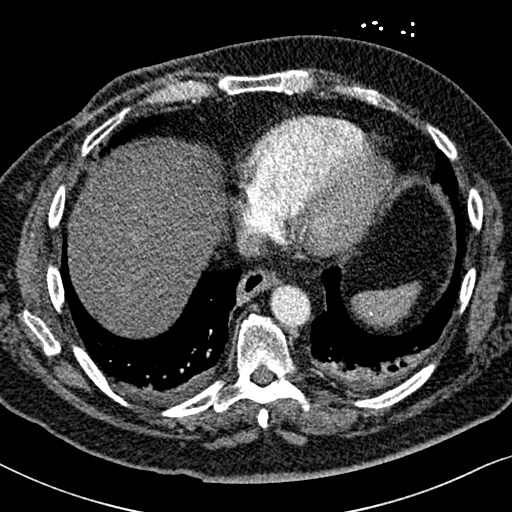
[im 58/156  lung]
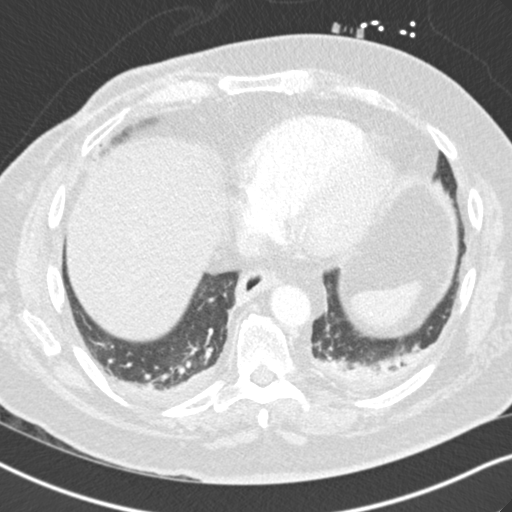
[im 69/156  lung]
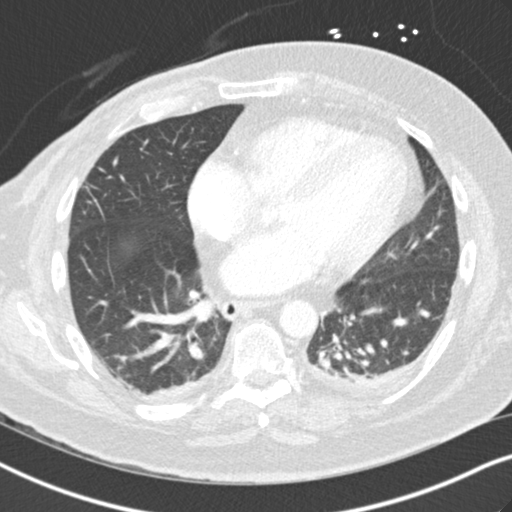
[im 87/156  lung]
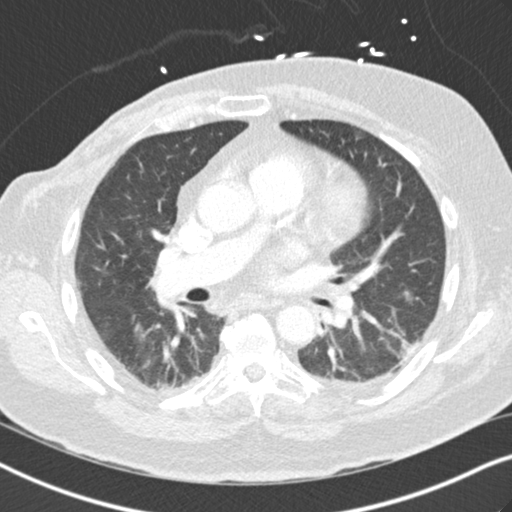
[im 98/156  lung]
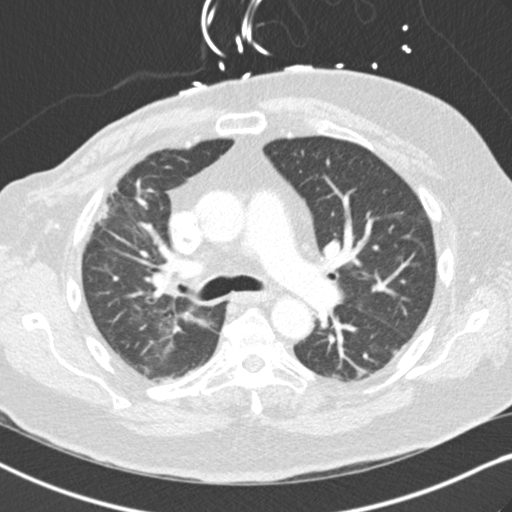
[im 110/156  mediastinal]
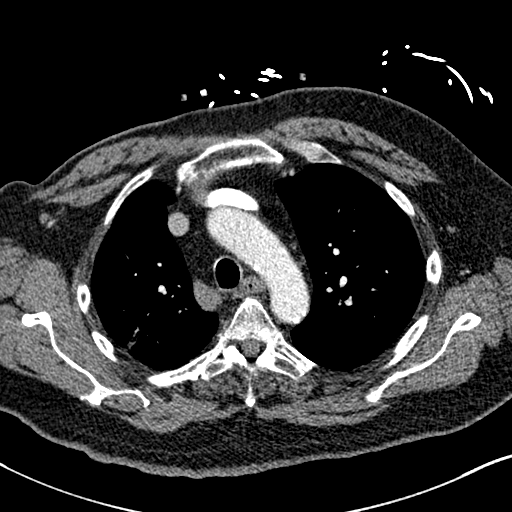
[im 110/156  lung]
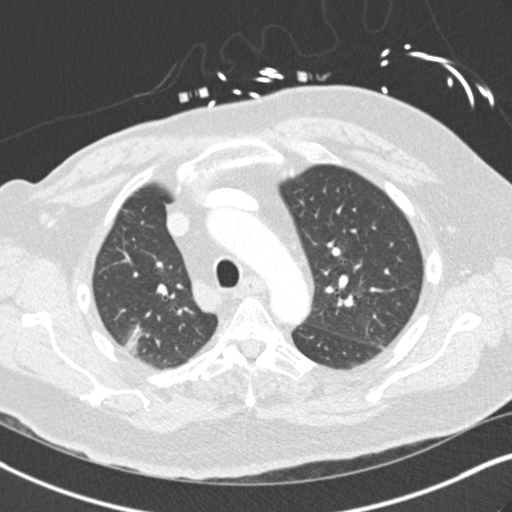
[im 121/156  lung]
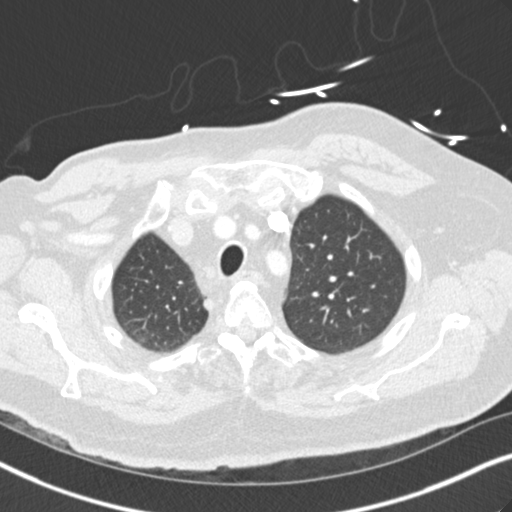
[im 133/156  lung]
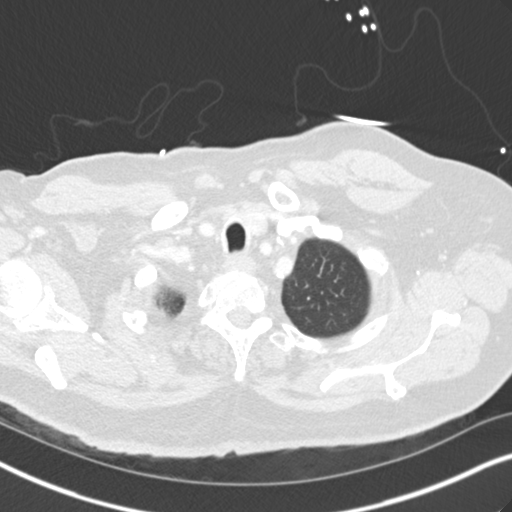
[im 144/156  lung]
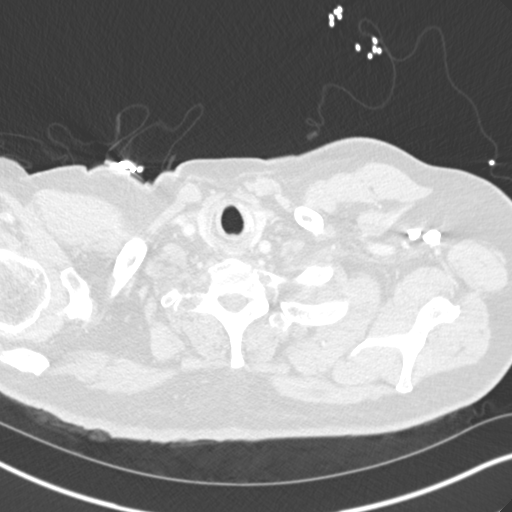

[Series 6: coronal · coronal · 0.67mm/px · 3 of 136 slices shown]
[im 28/136  lung]
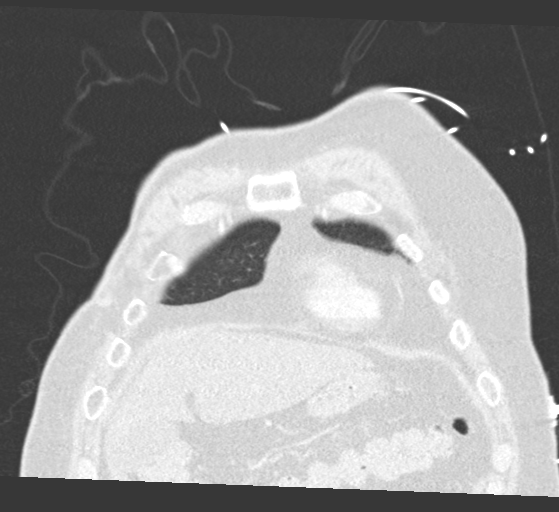
[im 55/136  lung]
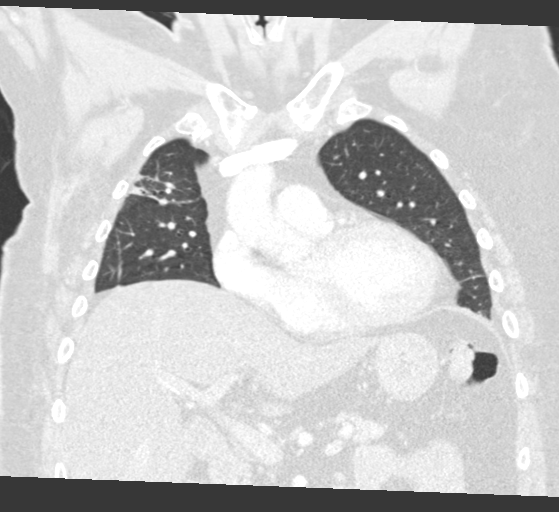
[im 82/136  lung]
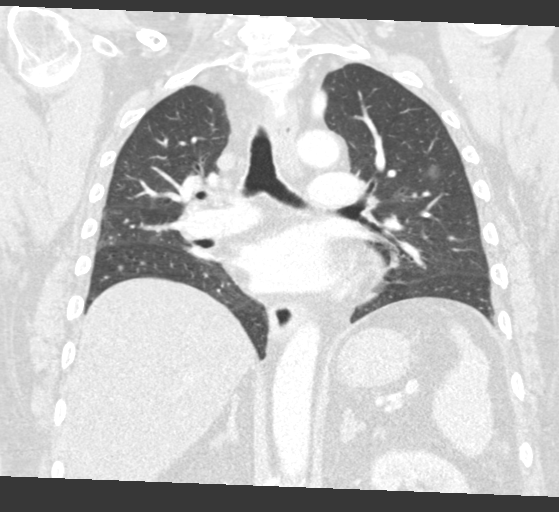

[15 of 36 positions shown; findings below may reference images not displayed]

FINDINGS: Cardiovascular: Extensive multi-vessel coronary artery
calcification. Mild global cardiomegaly. There is relative
enlargement of the right ventricle as well as enlargement of the
central pulmonary arteries in keeping with changes of pulmonary
arterial hypertension and elevated right heart pressure. The
thoracic aorta is age-appropriate.

Mediastinum/Nodes: No pathologic thoracic adenopathy.

Lungs/Pleura: Sparse scattered ground-glass pulmonary infiltrates
are present predominantly within the upper lobes bilaterally trace
bilateral pleural effusions are present. There is bibasilar
atelectasis present. No pneumothorax. The central airways are widely
patent.

Upper Abdomen: Marked P[place]ock steatosis. Cholelithiasis noted. The
gallbladder is distended, however, no pericholecystic inflammatory
changes are noted.

Musculoskeletal: No acute bone abnormality
IMPRESSION: Biapical ground-glass pulmonary infiltrates most in keeping with
atypical infection in the acute setting. The findings are not
pathognomonic of JJNTX-3U pneumonia and alternative infection
including viral and atypical organism should also be considered.

Extensive coronary artery calcification.

Morphologic changes in keeping with elevated right heart and
pulmonary arterial pressure.

Severe hepatic steatosis.

## 2022-03-27 IMAGING — DX DG CHEST 1V PORT
1 series · 1 of 1 positions shown · non-contrast
Comparison: October 24, 2019.

CLINICAL DATA: [id number] positive, cough.

EXAM:
PORTABLE CHEST 1 VIEW

[chest ap]
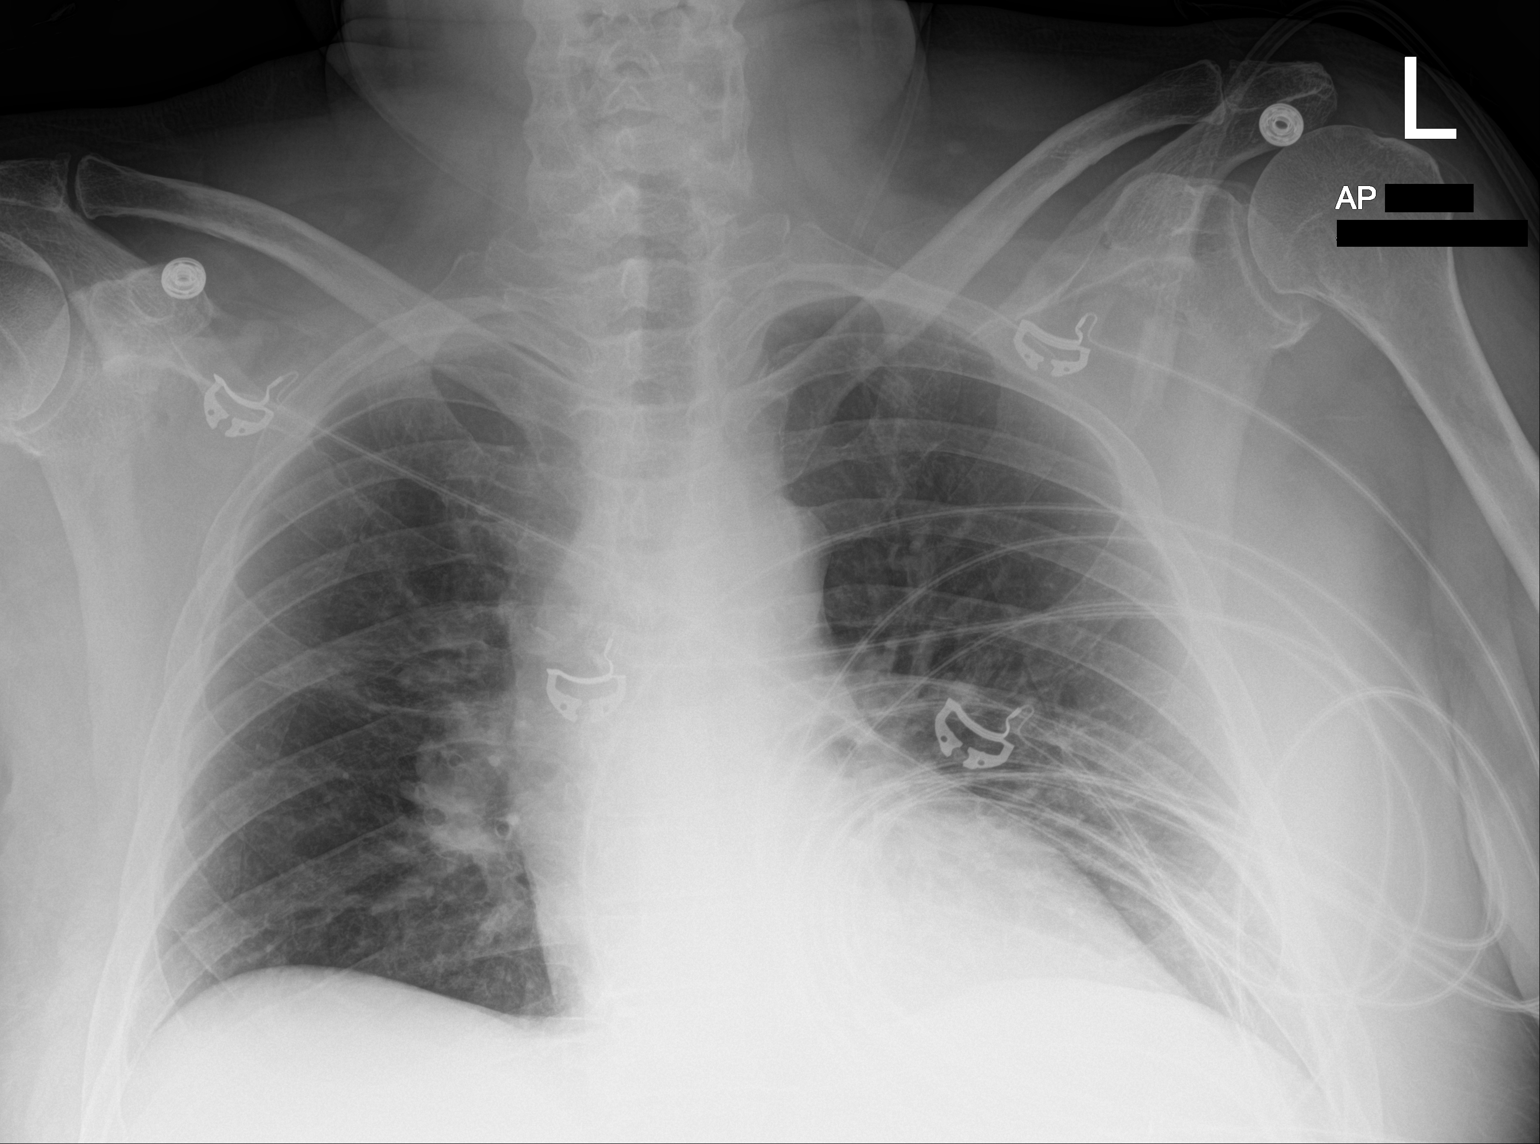

[1 of 1 positions shown; findings below may reference images not displayed]

FINDINGS: The heart size and mediastinal contours are within normal limits.
Both lungs are clear. The visualized skeletal structures are
unremarkable.
IMPRESSION: No active disease.

## 2022-05-15 ENCOUNTER — Other Ambulatory Visit: Payer: Self-pay | Admitting: Family Medicine

## 2022-05-15 DIAGNOSIS — M545 Low back pain, unspecified: Secondary | ICD-10-CM

## 2022-05-25 ENCOUNTER — Ambulatory Visit (INDEPENDENT_AMBULATORY_CARE_PROVIDER_SITE_OTHER): Payer: Medicare Other | Admitting: Family Medicine

## 2022-05-25 ENCOUNTER — Encounter: Payer: Self-pay | Admitting: Family Medicine

## 2022-05-25 VITALS — BP 134/64 | HR 84 | Temp 97.9°F | Wt 242.2 lb

## 2022-05-25 DIAGNOSIS — M7989 Other specified soft tissue disorders: Secondary | ICD-10-CM

## 2022-05-25 MED ORDER — FUROSEMIDE 40 MG PO TABS: 40.0000 mg | ORAL_TABLET | Freq: Every day | ORAL | 3 refills | Status: DC

## 2022-05-25 NOTE — Progress Notes (Signed)
Chief Complaint  Patient presents with   Foot Swelling    Reduce Lasix for 40 mg .spends too much time in the bathroom on the 80 mg.    Subjective: Patient is a 77 y.o. male here for LE swelling.   Has been having more severe urine urgency since starting the Lasix 80 mg/d. He is interested in going back down on the 40's. Still having some swelling in his LE's. No chest pain. Will sometimes get short of breath walking on the stairs.   Past Medical History:  Diagnosis Date   BPH (benign prostatic hyperplasia)    CTS (carpal tunnel syndrome)    Depression    Diabetes mellitus    Essential hypertension    GERD (gastroesophageal reflux disease)    History of breast cancer    Hypercholesteremia    Neuropathy     Objective: BP 134/64 (BP Location: Left Arm, Patient Position: Sitting, Cuff Size: Large)   Pulse 84   Temp 97.9 F (36.6 C) (Oral)   Wt 242 lb 4 oz (109.9 kg)   SpO2 95%   BMI 32.86 kg/m  General: Awake, appears stated age Heart: RRR, 2+ pitting b/l LE edema tapering at knees b/l Lungs: CTAB, no rales, wheezes or rhonchi. No accessory muscle use Psych: Age appropriate judgment and insight, normal affect and mood  Assessment and Plan: Localized swelling of both lower extremities - Plan: furosemide (LASIX) 40 MG tablet  Adverse effect of chronic medication.  Decrease Lasix from 80 mg daily to 40 mg daily.  Compression stockings, elevation, minding salt intake, and routine physical activity recommended. He did mention some balance issues.  I offered physical therapy which he politely declined.  He will let me know if he changes his mind. I have performed a biopsy on his nose and left cheek last year.  The nose lesion was unremarkable and has not recurred.  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  They did not do anything because the lesion was gone at that time.  It is since recurred.  He has been recommended to return to the  dermatology team for further evaluation.   The patient voiced understanding and agreement to the plan.  [place], DO 05/25/22  2:17 PM

## 2022-05-25 NOTE — Patient Instructions (Addendum)
Send me a message if you would like to see the physical therapy team for balance.   For the swelling in your lower extremities, be sure to elevate your legs when able, mind the salt intake, stay physically active and consider wearing compression stockings.  Please follow up with your dermatologist regarding the lesion on your chin.   Let us know if you need anything.

## 2022-06-01 DIAGNOSIS — B078 Other viral warts: Secondary | ICD-10-CM | POA: Diagnosis not present

## 2022-06-08 ENCOUNTER — Other Ambulatory Visit: Payer: Self-pay | Admitting: Family Medicine

## 2022-06-08 MED ORDER — AMLODIPINE BESYLATE 5 MG PO TABS: ORAL_TABLET | ORAL | 3 refills | Status: DC

## 2022-07-09 ENCOUNTER — Other Ambulatory Visit: Payer: Self-pay | Admitting: Family Medicine

## 2022-07-26 ENCOUNTER — Ambulatory Visit (INDEPENDENT_AMBULATORY_CARE_PROVIDER_SITE_OTHER): Payer: Medicare Other | Admitting: Family Medicine

## 2022-07-26 VITALS — BP 112/68 | HR 88 | Temp 98.0°F | Ht 72.0 in | Wt 242.4 lb

## 2022-07-26 DIAGNOSIS — E1165 Type 2 diabetes mellitus with hyperglycemia: Secondary | ICD-10-CM | POA: Diagnosis not present

## 2022-07-26 DIAGNOSIS — L97521 Non-pressure chronic ulcer of other part of left foot limited to breakdown of skin: Secondary | ICD-10-CM

## 2022-07-26 DIAGNOSIS — E11621 Type 2 diabetes mellitus with foot ulcer: Secondary | ICD-10-CM

## 2022-07-26 DIAGNOSIS — Z794 Long term (current) use of insulin: Secondary | ICD-10-CM | POA: Insufficient documentation

## 2022-07-26 MED ORDER — DOXYCYCLINE HYCLATE 100 MG PO TABS
100.0000 mg | ORAL_TABLET | Freq: Two times a day (BID) | ORAL | 0 refills | Status: AC
Start: 2022-07-26 — End: 2022-08-02

## 2022-07-26 NOTE — Progress Notes (Signed)
Chief Complaint  Patient presents with   Toe Pain    John [name] is a 77 y.o. male here for a skin complaint.  Duration: 1.5 weeks Location: L pinky toe Pruritic? No Painful? No (has neuropathy and limited sensation) Drainage? No New soaps/lotions/topicals/detergents? No Other associated symptoms: wound over area Therapies tried thus far: none  Past Medical History:  Diagnosis Date   BPH (benign prostatic hyperplasia)    CTS (carpal tunnel syndrome)    Depression    Diabetes mellitus    Essential hypertension    GERD (gastroesophageal reflux disease)    History of breast cancer    Hypercholesteremia    Neuropathy     BP 112/68 (BP Location: Left Arm, Patient Position: Sitting, Cuff Size: Normal)   Pulse 88   Temp 98 F (36.7 C) (Oral)   Ht 6' (1.829 m)   Wt 242 lb 6 oz (109.9 kg)   SpO2 97%   BMI 32.87 kg/m  Gen: awake, alert, appearing stated age Lungs: No accessory muscle use Skin: See below.  Minimal warmth and soft tissue swelling; no drainage, TTP, fluctuance, excoriation Psych: Age appropriate judgment and insight       Diabetic ulcer of toe of left foot associated with type 2 diabetes mellitus, limited to breakdown of skin - Plan: Sedimentation rate, doxycycline (VIBRA-TABS) 100 MG tablet  Type 2 diabetes mellitus with hyperglycemia, with long-term current use of insulin - Plan: Ambulatory referral to Endocrinology  He is a diabetic foot ulcer present for around 1-1/2 weeks.  He will schedule an appointment with his podiatrist in 2 weeks and cancel if he starts to improve.  He has a history of neuropathy and cannot feel anything in his feet so we will send in 7 days of doxycycline given the swelling and redness.  Also will check a sedimentation rate.  If extremely high, we will send him to the emergency department.  If moderately elevated, may trend and mildly elevated will feel reassured. F/u prn. The patient and his spouse voiced understanding and  agreement to the plan.  Jilda Roche [place], DO 07/26/22 3:55 PM

## 2022-07-26 NOTE — Patient Instructions (Addendum)
Give [place] 2-3 business days to get the results of your labs back.   If you do not hear anything about your referral in the next 1-2 weeks, call our office and ask for an update.  Call Dr. Kaylyn Layer for an appointment in a couple weeks. Cancel if you are doing better.  Keep the area clean and dry.   Things to look out for: emergence of pain, fevers, spreading redness, drainage of pus, or foul odor.  Let us know if you need anything.

## 2022-07-27 ENCOUNTER — Encounter: Payer: Self-pay | Admitting: Family Medicine

## 2022-07-27 LAB — SEDIMENTATION RATE: Sed Rate: 9 mm/hr (ref 0–20)

## 2022-08-09 ENCOUNTER — Other Ambulatory Visit: Payer: Self-pay | Admitting: Family Medicine

## 2022-08-09 DIAGNOSIS — G629 Polyneuropathy, unspecified: Secondary | ICD-10-CM

## 2022-08-11 ENCOUNTER — Ambulatory Visit (INDEPENDENT_AMBULATORY_CARE_PROVIDER_SITE_OTHER): Payer: Medicare Other | Admitting: Family Medicine

## 2022-08-11 ENCOUNTER — Encounter: Payer: Self-pay | Admitting: Podiatry

## 2022-08-11 ENCOUNTER — Ambulatory Visit (INDEPENDENT_AMBULATORY_CARE_PROVIDER_SITE_OTHER): Payer: Medicare Other

## 2022-08-11 ENCOUNTER — Ambulatory Visit: Payer: Medicare Other | Admitting: Podiatry

## 2022-08-11 ENCOUNTER — Encounter: Payer: Self-pay | Admitting: Family Medicine

## 2022-08-11 VITALS — BP 120/80 | HR 109 | Temp 98.3°F | Ht 72.0 in | Wt 245.2 lb

## 2022-08-11 DIAGNOSIS — G63 Polyneuropathy in diseases classified elsewhere: Secondary | ICD-10-CM

## 2022-08-11 DIAGNOSIS — E1165 Type 2 diabetes mellitus with hyperglycemia: Secondary | ICD-10-CM

## 2022-08-11 DIAGNOSIS — Z794 Long term (current) use of insulin: Secondary | ICD-10-CM

## 2022-08-11 DIAGNOSIS — C801 Malignant (primary) neoplasm, unspecified: Secondary | ICD-10-CM | POA: Diagnosis not present

## 2022-08-11 DIAGNOSIS — L97522 Non-pressure chronic ulcer of other part of left foot with fat layer exposed: Secondary | ICD-10-CM | POA: Diagnosis not present

## 2022-08-11 MED ORDER — TOUJEO MAX SOLOSTAR 300 UNIT/ML ~~LOC~~ SOPN: 50.0000 [IU] | PEN_INJECTOR | Freq: Two times a day (BID) | SUBCUTANEOUS | 2 refills | Status: DC

## 2022-08-11 MED ORDER — METFORMIN HCL ER 500 MG PO TB24: 1000.0000 mg | ORAL_TABLET | Freq: Two times a day (BID) | ORAL | 3 refills | Status: DC

## 2022-08-11 MED ORDER — HUMALOG KWIKPEN 200 UNIT/ML ~~LOC~~ SOPN: 25.0000 [IU] | PEN_INJECTOR | Freq: Three times a day (TID) | SUBCUTANEOUS | 2 refills | Status: DC

## 2022-08-11 NOTE — Progress Notes (Signed)
Chief Complaint  Patient presents with   neuropathy    Subjective: Patient is a 77 y.o. male here for neuropathy.  The patient has a history of male breast cancer.  This led to chemotherapy which cause neuropathy in both of his lower extremities.  He has lost sensation in his feet in addition to having a lot of discomfort when he ambulates. Requesting a stair lift chair.  DM2: Uncontrolled.  Does not monitor his sugars at home.  Compliant with metformin XR 500 mg 3 times daily, short acting insulin 25 units before breakfast and dinner, and Toujeo 90 units nightly.  No signs of hypoglycemia.  Diet is fair.  No routine exercise.  Past Medical History:  Diagnosis Date   BPH (benign prostatic hyperplasia)    CTS (carpal tunnel syndrome)    Depression    Diabetes mellitus    Essential hypertension    GERD (gastroesophageal reflux disease)    History of breast cancer    Hypercholesteremia    Neuropathy     Objective: BP 120/80 (BP Location: Right Arm, Patient Position: Sitting, Cuff Size: Normal)   Pulse (!) 109   Temp 98.3 F (36.8 C) (Oral)   Ht 6' (1.829 m)   Wt 245 lb 4 oz (111.2 kg)   SpO2 97%   BMI 33.26 kg/m  General: Awake, appears stated age Lungs: No accessory muscle use Psych: Age appropriate judgment and insight, normal affect and mood  Assessment and Plan: Type 2 diabetes mellitus with hyperglycemia, with long-term current use of insulin (HCC) - Plan: Ambulatory referral to Endocrinology, TOUJEO MAX SOLOSTAR 300 UNIT/ML Solostar Pen  Neuropathy associated with cancer (HCC)  Chronic, uncontrolled.  We have tried getting admitted with several endocrinologists.  He is requesting [place].  Will place referral today.  We will increase his Toujeo to 50 units twice daily and add mealtime insulin 3 times a day since he eats 3 times a day.  Increase metformin to 1000 mg twice daily. Monitor sugars at home. He will call his insurance company to look into the  requirements for getting a lift chair. Follow-up as originally scheduled with me. The patient voiced understanding and agreement to the plan.  Jilda Roche [place], DO 08/11/22  4:28 PM

## 2022-08-11 NOTE — Progress Notes (Signed)
  Subjective:  Patient ID: John [name], male    DOB: 1946/04/06,   MRN: [id number]  Chief Complaint  Patient presents with   Foot Problem    diabetic with infection on left little toe    77 y.o. male presents for concern of infection of left fifth toe. Relates several weeks ago he noticed the area and was seen by his PCP and given doxycycline and advised to follow-up with his podiatrist. Relates it was getting better but finished antibiotics and getting worse again. He was unable to get in with his normal podiatrist. Relates he has not been keeping it dressed with anything. His wife has been using peroxide on the toe.   Denies any other pedal complaints. Denies n/v/f/c.   Past Medical History:  Diagnosis Date   BPH (benign prostatic hyperplasia)    CTS (carpal tunnel syndrome)    Depression    Diabetes mellitus    Essential hypertension    GERD (gastroesophageal reflux disease)    History of breast cancer    Hypercholesteremia    Neuropathy     Objective:  Physical Exam: Vascular: DP/PT pulses 2/4 bilateral. CFT <3 seconds. Absent hair growth on digits. Edema noted to bilateral lower extremities. Xerosis noted bilaterally.  Skin. No lacerations or abrasions bilateral feet. Lateral dorsal fifth digit with 0.5 cm x 0.5 cm x 0.2 cm wound with fibrinous granular base and surrounding hyperkeratosis. No erythema edema or purulence noted. No probe to bone today Musculoskeletal: MMT 5/5 bilateral lower extremities in DF, PF, Inversion and Eversion. Deceased ROM in DF of ankle joint.  Neurological: Sensation intact to light touch. Protective sensation diminished bilateral.    Assessment:   1. Ulcer of toe of left foot, with fat layer exposed (HCC)   2. Type 2 diabetes mellitus with hyperglycemia, with long-term current use of insulin (HCC)      Plan:  Patient was evaluated and treated and all questions answered. Ulcer left fifth digit with fat layer exposed  -Debridement as  below. -Dressed with betadine, DSD. -Off-loading with surgical shoe. Dispensed  -No abx indicated today.  -Reviewed sedimentation rate and notes from PCP -X-rays reviewed. No osseous erosions noted today. -Discussed glucose control and proper protein-rich diet.  -Discussed if any worsening redness, pain, fever or chills to call or may need to report to the emergency room. Patient expressed understanding.   Procedure: Excisional Debridement of Wound Rationale: Removal of non-viable soft tissue from the wound to promote healing.  Anesthesia: none Pre-Debridement Wound Measurements: Overlying callus  Post-Debridement Wound Measurements:  0.5cm x  0.5cm x  0.2cm  Type of Debridement: Sharp Excisional Tissue Removed: Non-viable soft tissue Depth of Debridement: subcutaneous tissue. Technique: Sharp excisional debridement to bleeding, viable wound base.  Dressing: Dry, sterile, compression dressing. Disposition: Patient tolerated procedure well. Patient to return in 2 week for follow-up.  Return in about 2 weeks (around 08/25/2022) for wound check.   Louann Sjogren, DPM

## 2022-08-11 NOTE — Patient Instructions (Signed)
Monitor your sugars.   Keep the diet clean and stay active.  If you do not hear anything about your referral in the next 1-2 weeks, call our office and ask for an update.  Let us know if you need anything.

## 2022-08-12 ENCOUNTER — Encounter: Payer: Self-pay | Admitting: Family Medicine

## 2022-08-19 DIAGNOSIS — Z170 Estrogen receptor positive status [ER+]: Secondary | ICD-10-CM | POA: Diagnosis not present

## 2022-08-19 DIAGNOSIS — Z5181 Encounter for therapeutic drug level monitoring: Secondary | ICD-10-CM | POA: Diagnosis not present

## 2022-08-19 DIAGNOSIS — Z79810 Long term (current) use of selective estrogen receptor modulators (SERMs): Secondary | ICD-10-CM | POA: Diagnosis not present

## 2022-08-19 DIAGNOSIS — G608 Other hereditary and idiopathic neuropathies: Secondary | ICD-10-CM | POA: Diagnosis not present

## 2022-08-19 DIAGNOSIS — C50921 Malignant neoplasm of unspecified site of right male breast: Secondary | ICD-10-CM | POA: Diagnosis not present

## 2022-08-25 ENCOUNTER — Ambulatory Visit: Payer: Medicare Other | Admitting: Podiatry

## 2022-09-01 ENCOUNTER — Ambulatory Visit: Payer: Medicare Other | Admitting: Podiatry

## 2022-09-01 ENCOUNTER — Encounter: Payer: Self-pay | Admitting: Podiatry

## 2022-09-01 DIAGNOSIS — E1165 Type 2 diabetes mellitus with hyperglycemia: Secondary | ICD-10-CM

## 2022-09-01 DIAGNOSIS — L97522 Non-pressure chronic ulcer of other part of left foot with fat layer exposed: Secondary | ICD-10-CM | POA: Diagnosis not present

## 2022-09-01 DIAGNOSIS — Z794 Long term (current) use of insulin: Secondary | ICD-10-CM

## 2022-09-01 NOTE — Progress Notes (Signed)
  Subjective:  Patient ID: John [name], male    DOB: [birth year],   MRN: [id number]  Chief Complaint  Patient presents with   Foot Ulcer    Rm 23 2 week follow up left 5th toe pain. Pt states no changes.     77 y.o. male presents for follow-up of left fifth toe wound. Marland KitchenHas been dressing as instructed and relates no improvement.    Denies any other pedal complaints. Denies n/v/f/c.   Past Medical History:  Diagnosis Date   BPH (benign prostatic hyperplasia)    CTS (carpal tunnel syndrome)    Depression    Diabetes mellitus    Essential hypertension    GERD (gastroesophageal reflux disease)    History of breast cancer    Hypercholesteremia    Neuropathy     Objective:  Physical Exam: Vascular: DP/PT pulses 2/4 bilateral. CFT <3 seconds. Absent hair growth on digits. Edema noted to bilateral lower extremities. Xerosis noted bilaterally.  Skin. No lacerations or abrasions bilateral feet. Lateral dorsal fifth digit with 0.3 cm x 0.2 cm x 0.2 cm wound with fibrinous granular base and surrounding hyperkeratosis. No erythema edema or purulence noted. No probe to bone today Musculoskeletal: MMT 5/5 bilateral lower extremities in DF, PF, Inversion and Eversion. Deceased ROM in DF of ankle joint.  Neurological: Sensation intact to light touch. Protective sensation diminished bilateral.    Assessment:   1. Ulcer of toe of left foot, with fat layer exposed (HCC)   2. Type 2 diabetes mellitus with hyperglycemia, with long-term current use of insulin (HCC)       Plan:  Patient was evaluated and treated and all questions answered. Ulcer left fifth digit with fat layer exposed  -Debridement as below. -Dressed with betadine, DSD. -Off-loading with surgical shoe. -No abx indicated today.  -Reviewed sedimentation rate and notes from PCP -Discussed glucose control and proper protein-rich diet.  -Discussed if any worsening redness, pain, fever or chills to call or may need to report to  the emergency room. Patient expressed understanding.   Procedure: Excisional Debridement of Wound Rationale: Removal of non-viable soft tissue from the wound to promote healing.  Anesthesia: none Pre-Debridement Wound Measurements: Overlying callus  Post-Debridement Wound Measurements:  0.3cm x  0.2cm x  0.2cm  Type of Debridement: Sharp Excisional Tissue Removed: Non-viable soft tissue Depth of Debridement: subcutaneous tissue. Technique: Sharp excisional debridement to bleeding, viable wound base.  Dressing: Dry, sterile, compression dressing. Disposition: Patient tolerated procedure well. Patient to return in 2 week for follow-up.  No follow-ups on file.   Louann Sjogren, DPM

## 2022-09-09 DIAGNOSIS — C50421 Malignant neoplasm of upper-outer quadrant of right male breast: Secondary | ICD-10-CM | POA: Diagnosis not present

## 2022-09-09 DIAGNOSIS — Z170 Estrogen receptor positive status [ER+]: Secondary | ICD-10-CM | POA: Diagnosis not present

## 2022-09-15 ENCOUNTER — Encounter: Payer: Self-pay | Admitting: Podiatry

## 2022-09-15 ENCOUNTER — Ambulatory Visit: Payer: Medicare Other | Admitting: Podiatry

## 2022-09-15 DIAGNOSIS — L97522 Non-pressure chronic ulcer of other part of left foot with fat layer exposed: Secondary | ICD-10-CM

## 2022-09-15 DIAGNOSIS — Z794 Long term (current) use of insulin: Secondary | ICD-10-CM | POA: Diagnosis not present

## 2022-09-15 DIAGNOSIS — E1165 Type 2 diabetes mellitus with hyperglycemia: Secondary | ICD-10-CM

## 2022-09-15 NOTE — Progress Notes (Signed)
  Subjective:  Patient ID: John [name], male    DOB: 1945-12-08,   MRN: [id number]  Chief Complaint  Patient presents with   Toe Injury     Patient states toe does not bother hum unless he hit it     77 y.o. male presents for follow-up of left fifth toe wound. Marland KitchenHas been dressing as instructed and relates no improvement.    Denies any other pedal complaints. Denies n/v/f/c.   Past Medical History:  Diagnosis Date   BPH (benign prostatic hyperplasia)    CTS (carpal tunnel syndrome)    Depression    Diabetes mellitus    Essential hypertension    GERD (gastroesophageal reflux disease)    History of breast cancer    Hypercholesteremia    Neuropathy     Objective:  Physical Exam: Vascular: DP/PT pulses 2/4 bilateral. CFT <3 seconds. Absent hair growth on digits. Edema noted to bilateral lower extremities. Xerosis noted bilaterally.  Skin. No lacerations or abrasions bilateral feet. Lateral dorsal fifth digit with 0.2 cm x 0.2 cm x 0.2 cm wound with fibrinous granular base and surrounding hyperkeratosis. No erythema edema or purulence noted. No probe to bone today Musculoskeletal: MMT 5/5 bilateral lower extremities in DF, PF, Inversion and Eversion. Deceased ROM in DF of ankle joint.  Neurological: Sensation intact to light touch. Protective sensation diminished bilateral.    Assessment:   1. Ulcer of toe of left foot, with fat layer exposed (HCC)   2. Type 2 diabetes mellitus with hyperglycemia, with long-term current use of insulin (HCC)        Plan:  Patient was evaluated and treated and all questions answered. Ulcer left fifth digit with fat layer exposed  -Debridement as below. -Dressed with betadine, DSD. -Off-loading with surgical shoe. -No abx indicated today.  -Reviewed sedimentation rate and notes from PCP -Discussed glucose control and proper protein-rich diet.  -Discussed if any worsening redness, pain, fever or chills to call or may need to report to the  emergency room. Patient expressed understanding.   Procedure: Excisional Debridement of Wound Rationale: Removal of non-viable soft tissue from the wound to promote healing.  Anesthesia: none Pre-Debridement Wound Measurements: Overlying callus  Post-Debridement Wound Measurements:  0.2cm x  0.2cm x  0.2cm  Type of Debridement: Sharp Excisional Tissue Removed: Non-viable soft tissue Depth of Debridement: subcutaneous tissue. Technique: Sharp excisional debridement to bleeding, viable wound base.  Dressing: Dry, sterile, compression dressing. Disposition: Patient tolerated procedure well. Patient to return in 2 week for follow-up.  No follow-ups on file.   Louann Sjogren, DPM

## 2022-09-16 ENCOUNTER — Encounter: Payer: Self-pay | Admitting: Family Medicine

## 2022-09-21 DIAGNOSIS — C50921 Malignant neoplasm of unspecified site of right male breast: Secondary | ICD-10-CM | POA: Diagnosis not present

## 2022-09-21 DIAGNOSIS — E1140 Type 2 diabetes mellitus with diabetic neuropathy, unspecified: Secondary | ICD-10-CM | POA: Diagnosis not present

## 2022-09-21 DIAGNOSIS — E785 Hyperlipidemia, unspecified: Secondary | ICD-10-CM | POA: Diagnosis not present

## 2022-09-21 DIAGNOSIS — E1165 Type 2 diabetes mellitus with hyperglycemia: Secondary | ICD-10-CM | POA: Diagnosis not present

## 2022-09-22 DIAGNOSIS — N401 Enlarged prostate with lower urinary tract symptoms: Secondary | ICD-10-CM | POA: Diagnosis not present

## 2022-09-28 DIAGNOSIS — E538 Deficiency of other specified B group vitamins: Secondary | ICD-10-CM | POA: Diagnosis not present

## 2022-09-28 DIAGNOSIS — E785 Hyperlipidemia, unspecified: Secondary | ICD-10-CM | POA: Diagnosis not present

## 2022-09-28 DIAGNOSIS — E1165 Type 2 diabetes mellitus with hyperglycemia: Secondary | ICD-10-CM | POA: Diagnosis not present

## 2022-09-29 ENCOUNTER — Other Ambulatory Visit: Payer: Medicare Other

## 2022-10-04 ENCOUNTER — Ambulatory Visit: Payer: Medicare Other | Admitting: "Endocrinology

## 2022-10-11 ENCOUNTER — Ambulatory Visit: Payer: Medicare Other | Admitting: Family Medicine

## 2022-10-12 ENCOUNTER — Encounter: Payer: Self-pay | Admitting: Family Medicine

## 2022-10-12 ENCOUNTER — Ambulatory Visit (INDEPENDENT_AMBULATORY_CARE_PROVIDER_SITE_OTHER): Payer: Medicare Other | Admitting: Family Medicine

## 2022-10-12 VITALS — BP 120/72 | HR 98 | Temp 98.2°F | Ht 72.0 in | Wt 241.1 lb

## 2022-10-12 DIAGNOSIS — H8113 Benign paroxysmal vertigo, bilateral: Secondary | ICD-10-CM | POA: Diagnosis not present

## 2022-10-12 NOTE — Progress Notes (Signed)
Chief Complaint  Patient presents with   Dizziness    John [name] is 77 y.o. pt here for dizziness.  Duration: 1 week Pass out? No Spinning? Yes Head movement causes it.  Recent illness/fever? No Headache? No Neurologic signs? No Change in PO intake? No Palpitations? No Associated N/V.   Past Medical History:  Diagnosis Date   BPH (benign prostatic hyperplasia)    CTS (carpal tunnel syndrome)    Depression    Diabetes mellitus    Essential hypertension    GERD (gastroesophageal reflux disease)    History of breast cancer    Hypercholesteremia    Neuropathy    Allergies as of 10/12/2022       Reactions   Ramipril Anaphylaxis   Other Diarrhea   Severe intolerance to Chemotherapy in the past.   Sertraline Other (See Comments)   "Extreme headaches"   Adhesive [tape] Rash        Medication List        Accurate as of October 12, 2022  4:44 PM. If you have any questions, ask your nurse or doctor.          acetaminophen 650 MG CR tablet Commonly known as: TYLENOL Take 1,300 mg by mouth every 8 (eight) hours as needed for pain.   amLODipine 5 MG tablet Commonly known as: NORVASC TAKE 1 TABLET(5 MG) BY MOUTH DAILY   aspirin EC 325 MG tablet Take 325 mg by mouth daily.   atorvastatin 40 MG tablet Commonly known as: LIPITOR Take 1 tablet (40 mg total) by mouth daily.   furosemide 40 MG tablet Commonly known as: LASIX Take 1 tablet (40 mg total) by mouth daily.   HumaLOG KwikPen 200 UNIT/ML KwikPen Generic drug: insulin lispro Inject 25 Units into the skin 3 (three) times daily before meals.   metFORMIN 500 MG 24 hr tablet Commonly known as: GLUCOPHAGE-XR Take 2 tablets (1,000 mg total) by mouth 2 (two) times daily with a meal.   methocarbamol 500 MG tablet Commonly known as: ROBAXIN TAKE 1 TABLET(500 MG) BY MOUTH EVERY 8 HOURS AS NEEDED FOR MUSCLE SPASMS   multivitamin tablet Take 1 tablet by mouth daily.   omeprazole 40 MG capsule Commonly  known as: PRILOSEC TAKE 1 CAPSULE(40 MG) BY MOUTH DAILY   Shingrix injection Generic drug: Zoster Vaccine Adjuvanted Inject into the muscle.   tamoxifen 20 MG tablet Commonly known as: NOLVADEX Take 20 mg by mouth daily.   Toujeo Max SoloStar 300 UNIT/ML Solostar Pen Generic drug: insulin glargine (2 Unit Dial) Inject 50 Units into the skin in the morning and at bedtime.   vitamin C 1000 MG tablet Take 1,000 mg by mouth in the morning and at bedtime.   zinc sulfate 220 (50 Zn) MG capsule Take 1 capsule (220 mg total) by mouth daily.        BP 120/72 (BP Location: Left Arm, Patient Position: Sitting, Cuff Size: Normal)   Pulse 98   Temp 98.2 F (36.8 C) (Oral)   Ht 6' (1.829 m)   Wt 241 lb 2 oz (109.4 kg)   SpO2 93%   BMI 32.70 kg/m  General: Awake, alert, appears stated age Eyes: PERRLA, EOMi Ears: Patent, TM's neg b/l Heart: RRR, no murmurs, no carotid bruits Lungs: CTAB, no accessory muscle use MSK: 5/5 strength throughout, gait normal Neuro: No cerebellar signs, patellar reflex 2/4 b/l wo clonus, calcaneal reflex 0/4 b/l wo clonus, biceps reflex 1/4 b/l wo clonus; Dix-[place] + bilateral. Psych:  Age appropriate judgment and insight, normal mood and affect  Benign paroxysmal positional vertigo due to bilateral vestibular disorder  Epley maneuvers provided, stay hydrated, vestibular rehab if no improvement.  I have no concerns for cerebellar stroke or central process. F/u prn. Pt voiced understanding and agreement to the plan.  Jilda Roche [place], DO 10/12/22 4:44 PM

## 2022-10-12 NOTE — Patient Instructions (Signed)
Stay hydrated.  Let me know if this

## 2022-10-20 ENCOUNTER — Ambulatory Visit: Payer: Medicare Other | Admitting: Podiatry

## 2022-10-20 ENCOUNTER — Encounter: Payer: Self-pay | Admitting: Podiatry

## 2022-10-20 DIAGNOSIS — Z794 Long term (current) use of insulin: Secondary | ICD-10-CM | POA: Diagnosis not present

## 2022-10-20 DIAGNOSIS — E1165 Type 2 diabetes mellitus with hyperglycemia: Secondary | ICD-10-CM

## 2022-10-20 DIAGNOSIS — L97522 Non-pressure chronic ulcer of other part of left foot with fat layer exposed: Secondary | ICD-10-CM

## 2022-10-20 NOTE — Progress Notes (Signed)
  Subjective:  Patient ID: John [name], male    DOB: 1945-08-16,   MRN: [id number]  Chief Complaint  Patient presents with   Foot Ulcer    Follow up ulcer 5th toe left   "Its doing okay, still wrapping with the betadine"    77 y.o. male presents for follow-up of left fifth toe wound. Marland KitchenHas been dressing as instructed and relates doing ok Denies any other pedal complaints. Denies n/v/f/c.   Past Medical History:  Diagnosis Date   BPH (benign prostatic hyperplasia)    CTS (carpal tunnel syndrome)    Depression    Diabetes mellitus    Essential hypertension    GERD (gastroesophageal reflux disease)    History of breast cancer    Hypercholesteremia    Neuropathy     Objective:  Physical Exam: Vascular: DP/PT pulses 2/4 bilateral. CFT <3 seconds. Absent hair growth on digits. Edema noted to bilateral lower extremities. Xerosis noted bilaterally.  Skin. No lacerations or abrasions bilateral feet. Lateral dorsal fifth digit wound healed. Overlying hyperkeratosis noted with underlying ulceration healed.  Musculoskeletal: MMT 5/5 bilateral lower extremities in DF, PF, Inversion and Eversion. Deceased ROM in DF of ankle joint.  Neurological: Sensation intact to light touch. Protective sensation diminished bilateral.    Assessment:   1. Ulcer of toe of left foot, with fat layer exposed (HCC)   2. Type 2 diabetes mellitus with hyperglycemia, with long-term current use of insulin (HCC)        Plan:  Patient was evaluated and treated and all questions answered. Ulcer left fifth digit -healed -Debridement of overlying hyperkeratotiss  -Dressed with bandaid -No abx indicated today.  -Discussed glucose control and proper protein-rich diet.  -Discussed if any worsening redness, pain, fever or chills to call or may need to report to the emergency room. Patient expressed understanding.   Follow-up in 4 weeks for final check.    Return in about 4 weeks (around 11/17/2022) for wound  check.   Louann Sjogren, DPM

## 2022-11-03 ENCOUNTER — Encounter: Payer: Self-pay | Admitting: Family Medicine

## 2022-11-03 DIAGNOSIS — R42 Dizziness and giddiness: Secondary | ICD-10-CM

## 2022-11-08 ENCOUNTER — Emergency Department (HOSPITAL_COMMUNITY): Payer: Medicare Other

## 2022-11-08 ENCOUNTER — Emergency Department (HOSPITAL_COMMUNITY)
Admission: EM | Admit: 2022-11-08 | Discharge: 2022-11-09 | Disposition: A | Discharge status: Home / Self Care | Payer: Medicare Other | Attending: Emergency Medicine | Admitting: Emergency Medicine

## 2022-11-08 ENCOUNTER — Other Ambulatory Visit: Payer: Self-pay

## 2022-11-08 ENCOUNTER — Encounter (HOSPITAL_COMMUNITY): Payer: Self-pay | Admitting: Radiology

## 2022-11-08 DIAGNOSIS — R42 Dizziness and giddiness: Secondary | ICD-10-CM | POA: Diagnosis not present

## 2022-11-08 DIAGNOSIS — E1140 Type 2 diabetes mellitus with diabetic neuropathy, unspecified: Secondary | ICD-10-CM | POA: Insufficient documentation

## 2022-11-08 DIAGNOSIS — Z7982 Long term (current) use of aspirin: Secondary | ICD-10-CM | POA: Insufficient documentation

## 2022-11-08 DIAGNOSIS — R739 Hyperglycemia, unspecified: Secondary | ICD-10-CM | POA: Diagnosis not present

## 2022-11-08 DIAGNOSIS — Z853 Personal history of malignant neoplasm of breast: Secondary | ICD-10-CM | POA: Diagnosis not present

## 2022-11-08 DIAGNOSIS — I959 Hypotension, unspecified: Secondary | ICD-10-CM | POA: Diagnosis not present

## 2022-11-08 DIAGNOSIS — I6782 Cerebral ischemia: Secondary | ICD-10-CM | POA: Insufficient documentation

## 2022-11-08 DIAGNOSIS — I10 Essential (primary) hypertension: Secondary | ICD-10-CM | POA: Insufficient documentation

## 2022-11-08 DIAGNOSIS — Z7984 Long term (current) use of oral hypoglycemic drugs: Secondary | ICD-10-CM | POA: Insufficient documentation

## 2022-11-08 DIAGNOSIS — R600 Localized edema: Secondary | ICD-10-CM | POA: Diagnosis not present

## 2022-11-08 DIAGNOSIS — Z794 Long term (current) use of insulin: Secondary | ICD-10-CM | POA: Diagnosis not present

## 2022-11-08 DIAGNOSIS — I4510 Unspecified right bundle-branch block: Secondary | ICD-10-CM | POA: Insufficient documentation

## 2022-11-08 DIAGNOSIS — Z79899 Other long term (current) drug therapy: Secondary | ICD-10-CM | POA: Insufficient documentation

## 2022-11-08 DIAGNOSIS — R55 Syncope and collapse: Secondary | ICD-10-CM | POA: Diagnosis not present

## 2022-11-08 DIAGNOSIS — E1165 Type 2 diabetes mellitus with hyperglycemia: Secondary | ICD-10-CM | POA: Insufficient documentation

## 2022-11-08 LAB — URINALYSIS, ROUTINE W REFLEX MICROSCOPIC
Bacteria, UA: NONE SEEN
Bilirubin Urine: NEGATIVE
Glucose, UA: >=500 mg/dL — AB
Hgb urine dipstick: NEGATIVE
Ketones, ur: NEGATIVE mg/dL
Leukocytes,Ua: NEGATIVE
Nitrite: NEGATIVE
Protein, ur: NEGATIVE mg/dL
Specific Gravity, Urine: 1.007 (ref 1.005–1.030)
pH: 7 (ref 5.0–8.0)

## 2022-11-08 LAB — COMPREHENSIVE METABOLIC PANEL
ALT: 30 U/L (ref 0–44)
AST: 32 U/L (ref 15–41)
Albumin: 4 g/dL (ref 3.5–5.0)
Alkaline Phosphatase: 45 U/L (ref 38–126)
Anion gap: 14 (ref 5–15)
BUN: 20 mg/dL (ref 8–23)
CO2: 24 mmol/L (ref 22–32)
Calcium: 9.3 mg/dL (ref 8.9–10.3)
Chloride: 96 mmol/L — ABNORMAL LOW (ref 98–111)
Creatinine, Ser: 1.29 mg/dL — ABNORMAL HIGH (ref 0.61–1.24)
GFR, Estimated: 57 mL/min — ABNORMAL LOW (ref >60)
Glucose, Bld: 282 mg/dL — ABNORMAL HIGH (ref 70–99)
Potassium: 3.9 mmol/L (ref 3.5–5.1)
Sodium: 134 mmol/L — ABNORMAL LOW (ref 135–145)
Total Bilirubin: 0.6 mg/dL (ref 0.3–1.2)
Total Protein: 7.3 g/dL (ref 6.5–8.1)

## 2022-11-08 LAB — PROTIME-INR
INR: 1 (ref 0.8–1.2)
Prothrombin Time: 13.4 seconds (ref 11.4–15.2)

## 2022-11-08 LAB — DIFFERENTIAL
Abs Immature Granulocytes: 0.08 10*3/uL — ABNORMAL HIGH (ref 0.00–0.07)
Basophils Absolute: 0.1 10*3/uL (ref 0.0–0.1)
Basophils Relative: 1 %
Eosinophils Absolute: 0.1 10*3/uL (ref 0.0–0.5)
Eosinophils Relative: 2 %
Immature Granulocytes: 1 %
Lymphocytes Relative: 19 %
Lymphs Abs: 1.4 10*3/uL (ref 0.7–4.0)
Monocytes Absolute: 0.5 10*3/uL (ref 0.1–1.0)
Monocytes Relative: 7 %
Neutro Abs: 5.2 10*3/uL (ref 1.7–7.7)
Neutrophils Relative %: 70 %

## 2022-11-08 LAB — I-STAT CHEM 8, ED
BUN: 19 mg/dL (ref 8–23)
Calcium, Ion: 1.15 mmol/L (ref 1.15–1.40)
Chloride: 96 mmol/L — ABNORMAL LOW (ref 98–111)
Creatinine, Ser: 1.3 mg/dL — ABNORMAL HIGH (ref 0.61–1.24)
Glucose, Bld: 279 mg/dL — ABNORMAL HIGH (ref 70–99)
HCT: 44 % (ref 39.0–52.0)
Hemoglobin: 15 g/dL (ref 13.0–17.0)
Potassium: 4 mmol/L (ref 3.5–5.1)
Sodium: 134 mmol/L — ABNORMAL LOW (ref 135–145)
TCO2: 23 mmol/L (ref 22–32)

## 2022-11-08 LAB — CBC
HCT: 42.9 % (ref 39.0–52.0)
Hemoglobin: 14.2 g/dL (ref 13.0–17.0)
MCH: 29.2 pg (ref 26.0–34.0)
MCHC: 33.1 g/dL (ref 30.0–36.0)
MCV: 88.3 fL (ref 80.0–100.0)
Platelets: 248 10*3/uL (ref 150–400)
RBC: 4.86 MIL/uL (ref 4.22–5.81)
RDW: 15.1 % (ref 11.5–15.5)
WBC: 7.4 10*3/uL (ref 4.0–10.5)
nRBC: 0 % (ref 0.0–0.2)

## 2022-11-08 LAB — ETHANOL: Alcohol, Ethyl (B): <10 mg/dL (ref <10)

## 2022-11-08 MED ORDER — GADOBUTROL 1 MMOL/ML IV SOLN
10.0000 mL | Freq: Once | INTRAVENOUS | Status: AC | PRN
  Administered 2022-11-08: 10 mL via INTRAVENOUS

## 2022-11-08 MED ORDER — LACTATED RINGERS IV BOLUS
1000.0000 mL | Freq: Once | INTRAVENOUS | Status: AC
  Administered 2022-11-08: 1000 mL via INTRAVENOUS

## 2022-11-08 MED ORDER — MECLIZINE HCL 12.5 MG PO TABS: 12.5000 mg | ORAL_TABLET | Freq: Three times a day (TID) | ORAL | 0 refills | Status: DC | PRN

## 2022-11-08 MED ORDER — MECLIZINE HCL 25 MG PO TABS
12.5000 mg | ORAL_TABLET | Freq: Once | ORAL | Status: AC
  Administered 2022-11-08: 12.5 mg via ORAL
  Filled 2022-11-08: qty 1

## 2022-11-08 NOTE — ED Provider Notes (Signed)
[place] EMERGENCY DEPARTMENT AT [facility] Provider Note   CSN: [id number] Arrival date & time: 11/08/22  2029     History  Chief Complaint  Patient presents with   Marletta Lor    John [name] is a 77 y.o. male.  HPI Patient presents for dizziness.  Medical history includes HTN, depression, GERD, BPH, T2DM, cognitive impairment, neuropathy, breast cancer.  Over the past 2.5 weeks, patient has had episodic dizziness.  When this occurs, symptoms of dizziness will typically last 20 to 30 minutes.  He had an episode today while he was taking the garbage out.  Dizziness caused him to fall.  He was too dizzy to stand up.  His wife found him on the ground after approximately 10 minutes.  He denies loss of consciousness.  His wife called her neighbors and neighbors were able to help him into the house and into a chair.  Patient sat in the chair waiting for the dizziness to resolve.  He fell asleep and when he woke up had a brief 30 second episode of amnesia.  He states that his dizziness is currently resolved but he is hesitant to move too much, as he does not want to have recurrence of his dizziness.  He did get seen by his primary care doctor last week.  They discussed meclizine but he did not get a prescription for it.  Patient denies any areas of pain or suspected injury from his fall tonight.  This fall occurred 3 hours prior to arrival.    Home Medications Prior to Admission medications   Medication Sig Start Date End Date Taking? Authorizing Provider  meclizine (ANTIVERT) 12.5 MG tablet Take 1 tablet (12.5 mg total) by mouth 3 (three) times daily as needed for dizziness. 11/08/22  Yes Gloris Manchester, MD  acetaminophen (TYLENOL) 650 MG CR tablet Take 1,300 mg by mouth every 8 (eight) hours as needed for pain.    [provider]  amLODipine (NORVASC) 5 MG tablet TAKE 1 TABLET(5 MG) BY MOUTH DAILY 06/08/22   Carmelia Roller, Jilda Roche, DO  Ascorbic Acid (VITAMIN C) 1000 MG tablet  Take 1,000 mg by mouth in the morning and at bedtime.    [provider]  aspirin 325 MG EC tablet Take 325 mg by mouth daily.    [provider]  atorvastatin (LIPITOR) 40 MG tablet Take 1 tablet (40 mg total) by mouth daily. 03/08/22   Sharlene Dory, DO  finasteride (PROSCAR) 5 MG tablet Take by mouth. 01/27/22 09/22/23  [provider]  furosemide (LASIX) 40 MG tablet Take 1 tablet (40 mg total) by mouth daily. 05/25/22   Sharlene Dory, DO  insulin lispro (HUMALOG KWIKPEN) 200 UNIT/ML KwikPen Inject 25 Units into the skin 3 (three) times daily before meals. 08/11/22   Sharlene Dory, DO  metFORMIN (GLUCOPHAGE-XR) 500 MG 24 hr tablet Take 2 tablets (1,000 mg total) by mouth 2 (two) times daily with a meal. 08/11/22   Wendling, Jilda Roche, DO  methocarbamol (ROBAXIN) 500 MG tablet TAKE 1 TABLET(500 MG) BY MOUTH EVERY 8 HOURS AS NEEDED FOR MUSCLE SPASMS 05/17/22   Sharlene Dory, DO  Multiple Vitamin (MULTIVITAMIN) tablet Take 1 tablet by mouth daily.    [provider]  omeprazole (PRILOSEC) 40 MG capsule TAKE 1 CAPSULE(40 MG) BY MOUTH DAILY 07/12/22   Wendling, Jilda Roche, DO  tamoxifen (NOLVADEX) 20 MG tablet Take 20 mg by mouth daily.    [provider]  tamsulosin (  FLOMAX) 0.4 MG CAPS capsule Take 1 capsule by mouth daily. 01/27/22 09/22/23  [provider]  TOUJEO MAX SOLOSTAR 300 UNIT/ML Solostar Pen Inject 50 Units into the skin in the morning and at bedtime. 08/11/22   Sharlene Dory, DO  zinc sulfate 220 (50 Zn) MG capsule Take 1 capsule (220 mg total) by mouth daily. 10/29/19   Rhetta Mura, MD  Zoster Vaccine Adjuvanted [facility]) injection Inject into the muscle. 05/22/21   Judyann Munson, MD      Allergies    Ramipril, Other, Sertraline, and Adhesive [tape]    Review of Systems   Review of Systems  Neurological:  Positive for dizziness.  Psychiatric/Behavioral:  Positive for  confusion.   All other systems reviewed and are negative.   Physical Exam Updated Vital Signs BP (!) 143/68 (BP Location: Right Arm)   Pulse 80   Temp 97.9 F (36.6 C) (Oral)   Resp 18   SpO2 92%  Physical Exam Vitals and nursing note reviewed.  Constitutional:      General: He is not in acute distress.    Appearance: Normal appearance. He is well-developed. He is not ill-appearing, toxic-appearing or diaphoretic.  HENT:     Head: Normocephalic and atraumatic.     Right Ear: External ear normal.     Left Ear: External ear normal.     Nose: Nose normal.     Mouth/Throat:     Mouth: Mucous membranes are moist.  Eyes:     Extraocular Movements: Extraocular movements intact.     Conjunctiva/sclera: Conjunctivae normal.  Cardiovascular:     Rate and Rhythm: Normal rate and regular rhythm.  Pulmonary:     Effort: Pulmonary effort is normal. No respiratory distress.  Abdominal:     General: There is no distension.     Palpations: Abdomen is soft.     Tenderness: There is no abdominal tenderness.  Musculoskeletal:        General: No swelling. Normal range of motion.     Cervical back: Normal range of motion and neck supple.     Right lower leg: Edema present.     Left lower leg: Edema present.  Skin:    General: Skin is warm and dry.     Coloration: Skin is not jaundiced or pale.  Neurological:     General: No focal deficit present.     Mental Status: He is alert and oriented to person, place, and time.     Cranial Nerves: No cranial nerve deficit.     Sensory: No sensory deficit.     Motor: No weakness.     Coordination: Coordination normal.  Psychiatric:        Mood and Affect: Mood normal.        Behavior: Behavior normal.        Thought Content: Thought content normal.        Judgment: Judgment normal.     ED Results / Procedures / Treatments   Labs (all labs ordered are listed, but only abnormal results are displayed) Labs Reviewed  DIFFERENTIAL - Abnormal;  Notable for the following components:      Result Value   Abs Immature Granulocytes 0.08 (*)    All other components within normal limits  URINALYSIS, ROUTINE W REFLEX MICROSCOPIC - Abnormal; Notable for the following components:   Color, Urine COLORLESS (*)    Glucose, UA >=500 (*)    All other components within normal limits  I-STAT CHEM 8, ED -  Abnormal; Notable for the following components:   Sodium 134 (*)    Chloride 96 (*)    Creatinine, Ser 1.30 (*)    Glucose, Bld 279 (*)    All other components within normal limits  PROTIME-INR  CBC  ETHANOL  COMPREHENSIVE METABOLIC PANEL    EKG None  Radiology MR Brain W and Wo Contrast  Result Date: 11/08/2022 CLINICAL DATA:  Vertigo/dizziness EXAM: MRI HEAD WITHOUT AND WITH CONTRAST TECHNIQUE: Multiplanar, multiecho pulse sequences of the brain and surrounding structures were obtained without and with intravenous contrast. CONTRAST:  10mL GADAVIST GADOBUTROL 1 MMOL/ML IV SOLN COMPARISON:  None Available. FINDINGS: Brain: No acute infarct, mass effect or extra-axial collection. No acute or chronic hemorrhage. There is multifocal hyperintense T2-weighted signal within the white matter. Parenchymal volume and CSF spaces are normal. 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 The midline structures are normal. There is no abnormal contrast enhancement. Vascular: Major flow voids are preserved. Skull and upper cervical spine: Normal calvarium and skull base. Visualized upper cervical spine and soft tissues are normal. Sinuses/Orbits:No paranasal sinus fluid levels or advanced mucosal thickening. No mastoid or middle ear effusion. Normal orbits. IMPRESSION: 1. No acute intracranial abnormality. 2. 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 Electronically Signed   By: Deatra Robinson M.D.   On: 11/08/2022 22:28    Procedures Procedures    Medications Ordered in  ED Medications  lactated ringers bolus 1,000 mL (has no administration in time range)  meclizine (ANTIVERT) tablet 12.5 mg (12.5 mg Oral Given 11/08/22 2125)  gadobutrol (GADAVIST) 1 MMOL/ML injection 10 mL (10 mLs Intravenous Contrast Given 11/08/22 2208)    ED Course/ Medical Decision Making/ A&P                             Medical Decision Making Amount and/or Complexity of Data Reviewed Labs: ordered. Radiology: ordered.  Risk Prescription drug management.   This patient presents to the ED for concern of dizziness, this involves an extensive number of treatment options, and is a complaint that carries with it a high risk of complications and morbidity.  The differential diagnosis includes vestibular neuritis, Mnire's disease, BPPV, dehydration, metabolic arrangement, CVA, TIA   Co morbidities that complicate the patient evaluation  HTN, depression, GERD, BPH, T2DM, cognitive impairment, neuropathy, breast cancer   Additional history obtained:  Additional history obtained from patient's wife External records from outside source obtained and reviewed including EMR   Lab Tests:  I Ordered, and personally interpreted labs.  The pertinent results include: Hyperglycemia without evidence of DKA, normal electrolytes, normal hemoglobin   Imaging Studies ordered:  I ordered imaging studies including MRI brain I independently visualized and interpreted imaging which showed no acute findings I agree with the radiologist interpretation   Cardiac Monitoring: / EKG:  The patient was maintained on a cardiac monitor.  I personally viewed and interpreted the cardiac monitored which showed an underlying rhythm of: Sinus rhythm  Problem List / ED Course / Critical interventions / Medication management  Patient presents for episode of vertigo.  He states that these have been occurring intermittently over the past 2-1/2 weeks.  Today, he was in his normal state of health.  When  taking out the garbage and undergoing positional changes, he had an episode of vertigo which caused him to fall to the ground.  He does  not suspect any injuries and does not have any evidence of injury on exam.  He is vertigo is currently resolved but he feels that it may return if he sits up or moves around.  He was given a dose of meclizine and IV fluids.  Workup was initiated.  Workup is notable for hyperglycemia.  He states that he has had ongoing elevated blood sugars at home.  This may have caused some dehydration and worsened his episodes.  He was advised to maintain tight glucose control and to stay hydrated.  In the ED, patient underwent MRI brain which did not show acute findings.  He did have improved symptoms after meclizine.  He was prescribed to take as needed.  CMP was pending at time of signout.  Care of patient was signed to oncoming ED provider. I ordered medication including meclizine for dizziness; IV fluids for hydration Reevaluation of the patient after these medicines showed that the patient improved I have reviewed the patients home medicines and have made adjustments as needed   Social Determinants of Health:  Has PCP        Final Clinical Impression(s) / ED Diagnoses Final diagnoses:  Vertigo    Rx / DC Orders ED Discharge Orders          Ordered    meclizine (ANTIVERT) 12.5 MG tablet  3 times daily PRN        11/08/22 2245              Gloris Manchester, MD 11/08/22 2320

## 2022-11-08 NOTE — Discharge Instructions (Addendum)
Test results today are reassuring.  A prescription for a medication called meclizine was sent to your pharmacy.  Take this only as needed for episodes of vertigo.    When your blood sugar is high, you will urinate frequently.  You can quickly become dehydrated.  Being dehydrated will likely worsen these episodes of vertigo.  Ensure that you optimize blood sugar and drink plenty of water to stay hydrated.    If you would like to see an ear, nose, and throat doctor, telephone number is below.    Return to the emergency department for any new or worsening symptoms of concern.

## 2022-11-08 NOTE — ED Notes (Signed)
Lab to add troponin to blood already in lab

## 2022-11-08 NOTE — Therapy (Signed)
OUTPATIENT PHYSICAL THERAPY VESTIBULAR EVALUATION     Patient Name: John [name] MRN: [id number] DOB:Jul 12, 1945, 77 y.o., male Today's Date: 11/11/2022  END OF SESSION:  PT End of Session - 11/11/22 1425     Visit Number 1    Date for PT Re-Evaluation 12/23/22    Authorization Type Blue MCR    Progress Note Due on Visit 10    PT Start Time 1405    PT Stop Time 1445    PT Time Calculation (min) 40 min    Activity Tolerance Patient tolerated treatment well    Behavior During Therapy WFL for tasks assessed/performed             Past Medical History:  Diagnosis Date   BPH (benign prostatic hyperplasia)    CTS (carpal tunnel syndrome)    Depression    Diabetes mellitus    Essential hypertension    GERD (gastroesophageal reflux disease)    History of breast cancer    Hypercholesteremia    Neuropathy    Past Surgical History:  Procedure Laterality Date   CATARACT EXTRACTION, BILATERAL  01/18/2017   LITHOTRIPSY     MASTECTOMY Right 05/02/2018   TONSILLECTOMY AND ADENOIDECTOMY     Patient Active Problem List   Diagnosis Date Noted   Type 2 diabetes mellitus with hyperglycemia, with long-term current use of insulin (HCC) 07/26/2022   Bronchitis 03/20/2021   Acute non-recurrent frontal sinusitis 03/20/2021   Viral URI with cough 01/13/2021   Hyperlipidemia 11/21/2020   Vertigo 10/20/2020   Localized swelling of both lower extremities 10/20/2020   Tendinitis of right hip flexor 10/20/2020   Aortic atherosclerosis (HCC) 09/10/2020   Insomnia 09/10/2020   Cognitive impairment 08/18/2020   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   Increasing residual urine 06/04/2020   History of COVID-19 12/10/2019   Physical deconditioning 12/10/2019   Fatigue 12/10/2019   Unsteady gait 12/10/2019   Pneumonia due to COVID-19 virus 10/26/2019   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   Essential hypertension 10/17/2019   Depression, major, single episode, complete  remission (HCC) 10/17/2019   History of breast cancer    GERD (gastroesophageal reflux disease)    BPH (benign prostatic hyperplasia)    CTS (carpal tunnel syndrome)    Diabetic polyneuropathy associated with type 2 diabetes mellitus (HCC) 09/29/2019   Neuropathic pain of foot 09/29/2019   Port-A-Cath in place 01/03/2019   Peripheral sensory neuropathy 05/17/2018   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   Hypomagnesemia 01/18/2018   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) 12/13/2017   Axillary adenopathy 12/09/2017   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   S/P cataract extraction and insertion of intraocular lens 01/17/2017   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   PVD (posterior vitreous detachment), left 01/07/2017   Hyperopia of both eyes with astigmatism 11/09/2016   Class 1 obesity due to excess calories without serious comorbidity with body mass index (BMI) of 31.0 to 31.9 in adult 09/17/2016   Encounter for monitoring tamoxifen therapy 03/05/2016   Diabetes mellitus type 2 without retinopathy (HCC) 07/02/2015   Diabetes 1.5, 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   Palpitation 09/16/2014    PCP: Sharlene Dory, DO   REFERRING PROVIDER: Sharlene Dory*  REFERRING DIAG: R42 (ICD-10-CM) - Vertigo   THERAPY DIAG:  Dizziness and giddiness  ONSET DATE: ~ October 05, 2022  Rationale for Evaluation and Treatment: Rehabilitation  SUBJECTIVE:   SUBJECTIVE STATEMENT:  Four weeks ago vertigo started, my head went nuts, ended up on the floor, about 15 minutes before could get up and could get to sitting, spent 3 hours sitting, got to bed around 11, next week was dizzy, next week normal, then dizzy again, 4 times, yesterday taking garbage out, took two neighbors to get him out and bring him up.  Time #3 woke him up to make him dizzy.  Had period where he didn't know where he was.  Almost as scary as  when his body decided to throw him on the ground.  Monday night had to get him a ambulance to [place] and they did an MRI and blood tests, told him that his blood sugar is low.  He's in limbo land right now.  He does not check blood sugars regularly, but partly because he does not feel he has received adequate instructions on what to do about highs and lows.   Pt accompanied by: self  PERTINENT HISTORY: GERD, HTN, H/o Breast cancer, Neuropathy associated with cancer.  History of TIAs, T2DM, HOH  PAIN:  Are you having pain? No  PRECAUTIONS: Fall  RED FLAGS: None   WEIGHT BEARING RESTRICTIONS: No  FALLS: Has patient fallen in last 6 months? Yes. Number of falls 3 in last month  LIVING ENVIRONMENT: Lives with: lives with their spouse Lives in: House/apartment Stairs: Yes: Internal: 14 steps; on right going up and on left going up  sleeping in recliner  Has following equipment at home: Walker - 4 wheeled and walking pole  PLOF: Independent  PATIENT GOALS: do the things to fix the stuff in my head to fix the dizzy  OBJECTIVE:   DIAGNOSTIC FINDINGS:   11/08/22 MR Brain MPRESSION: 1. No acute intracranial abnormality. 2. 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  COGNITION: Overall cognitive status: Within functional limits for tasks assessed   SENSATION: History of   POSTURE:  No Significant postural limitations  Cervical ROM:    Active A/PROM (deg) eval  Flexion 30  Extension 35  Right lateral flexion 30  Left lateral flexion 30  Right rotation 75  Left rotation 70  (Blank rows = not tested)  STRENGTH: 5/5 bil UE and LE strength   GAIT: Gait pattern: WFL Distance walked: 23' Assistive device utilized:  walking pole Level of assistance: Modified independence Comments: visually slow gait, 0.28m/s  PATIENT SURVEYS:  DHI 28%  VESTIBULAR ASSESSMENT:  GENERAL OBSERVATION: enters with  walking pole, needed increased time to stand, had to stand for~ 30 sec due to dizziness after standing.  Also became dizzy with cervical AROM looking down and head tilt.  Noted poor activity tolerance overall, after walking through clinic again and sitting on treatment table reported not feeling well enough to complete testing today, felt need to go home and rest for an hour.     SYMPTOM BEHAVIOR:  Subjective history: Symptoms started 1 month ago, with 4 severe attacks after bending over.    Non-Vestibular symptoms:  none reported.   Type of dizziness: Imbalance (Disequilibrium) and Spinning/Vertigo  Frequency: 4 attacks over last visit  Duration: variable  Aggravating factors: Induced by position change: bending over  Relieving factors: slow movements and sitting still  Progression of symptoms: worse  OCULOMOTOR EXAM:  Deferred  VESTIBULAR - OCULAR REFLEX:   Deferred    POSITIONAL TESTING: defered   MOTION SENSITIVITY:  Increased dizziness with standing up, bending over, quick  head turns (flexion and SB to R)    OTHOSTATICS: NT  VESTIBULAR TREATMENT:                                                                                                   DATE:   11/11/2022 Self Care: see patient education  PATIENT EDUCATION: Education details: education on BPPV and treatment, advice on meclizine, POC Person educated: Patient Education method: Medical illustrator Education comprehension: verbalized understanding  HOME EXERCISE PROGRAM:  GOALS: Goals reviewed with patient? Yes  SHORT TERM GOALS: Target date: 11/25/2022   Patient will report compliance with initial HEP.  Baseline: Goal status: INITIAL  2.  Patient will report resolution of dizziness bending over.   Baseline:  Goal status: INITIAL  3.  Patient will complete further balance testing.  Baseline:  Goal status: INITIAL  LONG TERM GOALS: Target date: 12/23/2022    Patient will be independent with  progressed HEP to improve outcomes and carryover.  Baseline:  Goal status: INITIAL  2.  Patient will report 75% improvement in dizziness. Baseline:  Goal status: INITIAL  3.  Patient will demonstrate low risk of falls on appropriate test measure with goal to be determined to decrease risk of falls.  Baseline:  Goal status: INITIAL  4.  Patient will report 18 points improvement on DHI to demonstrate improved QOL. Baseline: 28% Goal status: INITIAL    ASSESSMENT:  CLINICAL IMPRESSION: Crispin Coughran  is a 77 y.o. male who was seen today for physical therapy evaluation and treatment for vertigo.  He reports 4 separate attacks over last month resulting in falls and trip to [facility] via ambulance with last attack.  MRI ruled out CVA or TIA, impression was mostly likely peripheral vertigo.  Noted today extremely sensitive to head movements, becoming dizzy standing up or moving head quickly or bending over.  Tilting head to right also produced symptoms.  Unfortunately, just this was taxing enough that he did not feel up to positional testing and treatment for BPPV today, so we discussed taking meclizine before next session to help suppress vestibular response and improve tolerance as well as have wife drive in case dizzy afterwards for safety.  Discussed other causes of dizziness and how they are addressed in vestibular rehab, but considering symptoms observed today BPPV is a likely diagnosis.  Sharlene Dory would benefit from skilled physical therapy to decrease dizziness, improve safety and prevent falls with injury.    OBJECTIVE IMPAIRMENTS: Abnormal gait, decreased activity tolerance, decreased balance, decreased mobility, decreased safety awareness, and dizziness.   ACTIVITY LIMITATIONS: bending, standing, stairs, and locomotion level  PARTICIPATION LIMITATIONS: cleaning, driving, shopping, and community activity  PERSONAL FACTORS: Age, Past/current experiences, Time since onset of  injury/illness/exacerbation, and 3+ comorbidities: GERD, HTN, H/o Breast cancer, Neuropathy associated with cancer.  History of TIAs, T2DM, HOH  are also affecting patient's functional outcome.   REHAB POTENTIAL: Good  CLINICAL DECISION MAKING: Evolving/moderate complexity  EVALUATION COMPLEXITY: Moderate   PLAN:  PT FREQUENCY: 1-2x/week  PT DURATION: 6 weeks  PLANNED INTERVENTIONS: Therapeutic exercises, Therapeutic activity, Neuromuscular  re-education, Balance training, Gait training, Patient/Family education, Self Care, Joint mobilization, Stair training, Canalith repositioning, Visual/preceptual remediation/compensation, Dry Needling, Spinal mobilization, Manual therapy, and Re-evaluation  PLAN FOR NEXT SESSION: positional testing and canalith repositioning, further testing as time allows (need orthostatics, VOR, oculomotor).    Jena Gauss, PT, DPT  11/11/2022, 3:24 PM

## 2022-11-08 NOTE — ED Triage Notes (Signed)
Pt states he had an episode of vertigo tonight and fell. States he bent over to pick something up and when he stood up got dizzy. Pt states he laid on the ground for 15 minutes until his wife got there due to being to dizzy. Pt then went into the house and felt disoriented.

## 2022-11-09 ENCOUNTER — Telehealth: Payer: Self-pay | Admitting: *Deleted

## 2022-11-09 LAB — TROPONIN I (HIGH SENSITIVITY): Troponin I (High Sensitivity): 32 ng/L — ABNORMAL HIGH (ref <18)

## 2022-11-09 NOTE — ED Notes (Signed)
PT ambulated and is tolerating PO fluids.

## 2022-11-09 NOTE — Telephone Encounter (Signed)
For BPH? Who did he see before?

## 2022-11-09 NOTE — ED Provider Notes (Signed)
Care assumed from Dr. Durwin Nora.  Patient here with intermittent dizziness for the past several weeks.  Did have episode today that caused him to fall.  No loss of consciousness.  No chest pain or shortness of breath.  MRI is negative for infarct.  Thought to have peripheral vertigo.  Awaiting chemistry and repeat troponin.  These are reassuring.  Patient denies symptoms and states he is ready to go home.  Denies any dizziness.  He is given a prescription for meclizine by Dr. Durwin Nora.  Instructed to follow-up with his PCP.  Return precautions discussed.     John Octave, MD 11/09/22 [phone]

## 2022-11-09 NOTE — Telephone Encounter (Signed)
Diagnosis  Benign prostatic hyperplasia with lower urinary tract symptoms, symptom details unspecified   Last saw Tobie Lords- [facility]

## 2022-11-09 NOTE — Telephone Encounter (Signed)
Pts wife sent this via her mychart  Can you suggest a good urologist with [facility] for [place]? He has seen one in [place] but that has not worked. His birthday is Apr 09, 2046. Let [place] know please. He needs to see one soon. Thank you.

## 2022-11-10 ENCOUNTER — Other Ambulatory Visit: Payer: Self-pay | Admitting: Family Medicine

## 2022-11-10 DIAGNOSIS — N400 Enlarged prostate without lower urinary tract symptoms: Secondary | ICD-10-CM

## 2022-11-10 NOTE — Telephone Encounter (Signed)
Referral done

## 2022-11-11 ENCOUNTER — Other Ambulatory Visit: Payer: Self-pay | Admitting: Family Medicine

## 2022-11-11 ENCOUNTER — Encounter: Payer: Self-pay | Admitting: Physical Therapy

## 2022-11-11 ENCOUNTER — Ambulatory Visit: Payer: Medicare Other | Attending: Family Medicine | Admitting: Physical Therapy

## 2022-11-11 DIAGNOSIS — R42 Dizziness and giddiness: Secondary | ICD-10-CM | POA: Diagnosis not present

## 2022-11-11 DIAGNOSIS — M7989 Other specified soft tissue disorders: Secondary | ICD-10-CM

## 2022-11-11 MED ORDER — FUROSEMIDE 40 MG PO TABS: 40.0000 mg | ORAL_TABLET | Freq: Every day | ORAL | 3 refills | Status: DC

## 2022-11-15 ENCOUNTER — Ambulatory Visit: Payer: Medicare Other | Admitting: Physical Therapy

## 2022-11-16 ENCOUNTER — Ambulatory Visit: Payer: Medicare Other | Admitting: Family Medicine

## 2022-11-16 ENCOUNTER — Ambulatory Visit (INDEPENDENT_AMBULATORY_CARE_PROVIDER_SITE_OTHER): Payer: Medicare Other | Admitting: Family Medicine

## 2022-11-16 ENCOUNTER — Encounter: Payer: Self-pay | Admitting: Family Medicine

## 2022-11-16 VITALS — BP 118/72 | HR 88 | Temp 97.9°F | Ht 72.0 in | Wt 239.5 lb

## 2022-11-16 DIAGNOSIS — R42 Dizziness and giddiness: Secondary | ICD-10-CM | POA: Diagnosis not present

## 2022-11-16 DIAGNOSIS — Z794 Long term (current) use of insulin: Secondary | ICD-10-CM

## 2022-11-16 DIAGNOSIS — E1165 Type 2 diabetes mellitus with hyperglycemia: Secondary | ICD-10-CM | POA: Diagnosis not present

## 2022-11-16 LAB — GLUCOSE, POCT (MANUAL RESULT ENTRY): POC Glucose: 385 mg/dl — AB (ref 70–99)

## 2022-11-16 MED ORDER — METFORMIN HCL ER 500 MG PO TB24
1000.0000 mg | ORAL_TABLET | Freq: Two times a day (BID) | ORAL | 3 refills | Status: DC
Start: 2022-11-16 — End: 2023-05-04

## 2022-11-16 MED ORDER — TOUJEO MAX SOLOSTAR 300 UNIT/ML ~~LOC~~ SOPN
50.0000 [IU] | PEN_INJECTOR | Freq: Two times a day (BID) | SUBCUTANEOUS | 3 refills | Status: DC
Start: 2022-11-16 — End: 2022-12-17

## 2022-11-16 NOTE — Progress Notes (Signed)
Chief Complaint  Patient presents with   Hospitalization Follow-up    Subjective: Patient is a 77 y.o. John [name] here for ER f/u.  Here with his spouse.  Patient had an episode of vertigo and fell down.  MRI of the brain was unremarkable.  He is working with the vestibular rehab team.  He has trouble staying hydrated.  He will sometimes get lightheaded when he stands up.  He urinates frequently.  He tries to stay on top of his fluid intake.  He has poorly controlled diabetes.  The patient has difficulty getting hold of his endocrinologist. Takes 25 u of short acting insulin twice daily only. Taking 45 u bid of Lantus. Does not check sugars. Compliant w Metformin XR 1000 mg bid.   Past Medical History:  Diagnosis Date   BPH (benign prostatic hyperplasia)    CTS (carpal tunnel syndrome)    Depression    Diabetes mellitus    Essential hypertension    GERD (gastroesophageal reflux disease)    History of breast cancer    Hypercholesteremia    Neuropathy     Objective: BP 118/72 (BP Location: Left Arm, Patient Position: Sitting, Cuff Size: Large)   Pulse 88   Temp 97.9 F (36.6 C) (Oral)   Ht 6' (1.829 m)   Wt 239 lb 8 oz (108.6 kg)   SpO2 96%   BMI John.48 kg/m  General: Awake, appears stated age Mouth: MMD Heart: RRR, 2+ pitting b/l LE edema tapering at mid tibia Lungs: CTAB, no rales, wheezes or rhonchi. No accessory muscle use Psych: Age appropriate judgment and insight, normal affect and mood  Assessment and Plan: Type 2 diabetes mellitus with hyperglycemia, with long-term current use of insulin (HCC) - Plan: metFORMIN (GLUCOPHAGE-XR) 500 MG 24 hr tablet, TOUJEO MAX SOLOSTAR 300 UNIT/ML Solostar Pen, Ambulatory referral to Endocrinology, POCT Glucose (CBG)  Vertigo - Plan: POCT Glucose (CBG)  Lightheadedness  Chronic, not controlled.  Need to take Humalog 25 units 3 times daily.  We checked his sugar in the office and is 385.  Continue metformin XR 1000 g twice a day.  We will  increase his Toujeo to 50 units twice daily.  We will refer him to another endocrinologist.  I will ask him to send me some sugar readings in 2 weeks and follow-up with him in 1 month as he will not likely be able to get in with endocrinology team before then. Appreciate the vestibular rehab team. I think due to hyperglycemia, he is having polyuria which is dehydrating him.  Needs to stay hydrated, improve sugars and get up slowly in the meanwhile.  Consider compression stockings. The patient and his spouse voiced understanding and agreement to the plan.  Jilda Roche Hessmer, DO 11/16/22  4:53 PM

## 2022-11-16 NOTE — Patient Instructions (Signed)
Check your sugars around 2-3 times per week. Alternate checking in the morning before you eat, in the afternoon and before bed. Write them down and bring it to your next appointment.   Keep the diet clean and stay active.  Aim to do some physical exertion for 150 minutes per week. This is typically divided into 5 days per week, 30 minutes per day. The activity should be enough to get your heart rate up. Anything is better than nothing if you have time constraints.  Take your short acting insulin before meals (3 times daily).   We are moving up to 50 u twice daily of the Toujeo.   Send me some sugar readings in 2 weeks and we will see each other in a month.  If you do not hear anything about your referral in the next 1-2 weeks, call our office and ask for an update.  Let us know if you need anything.

## 2022-11-17 ENCOUNTER — Encounter: Payer: Self-pay | Admitting: Physical Therapy

## 2022-11-17 ENCOUNTER — Ambulatory Visit: Payer: Medicare Other | Admitting: Physical Therapy

## 2022-11-17 ENCOUNTER — Other Ambulatory Visit: Payer: Self-pay | Admitting: Family Medicine

## 2022-11-17 ENCOUNTER — Telehealth: Payer: Self-pay | Admitting: Family Medicine

## 2022-11-17 DIAGNOSIS — R42 Dizziness and giddiness: Secondary | ICD-10-CM

## 2022-11-17 MED ORDER — DEXCOM G6 RECEIVER DEVI: 0 refills | Status: DC

## 2022-11-17 MED ORDER — DEXCOM G6 SENSOR MISC: 0 refills | Status: DC

## 2022-11-17 NOTE — Therapy (Signed)
OUTPATIENT PHYSICAL THERAPY Treatment   Patient Name: John [name] MRN: [id number] DOB:Oct 23, 1945, 77 y.o., male Today's Date: 11/17/2022  END OF SESSION:  PT End of Session - 11/17/22 1618     Visit Number 2    Date for PT Re-Evaluation 12/23/22    Authorization Type [employer] M[employer]    Progress Note Due on Visit 10    PT Start Time 1615    PT Stop Time 1700    PT Time Calculation (min) 45 min    Activity Tolerance Patient tolerated treatment well    Behavior During Therapy WFL for tasks assessed/performed             Past Medical History:  Diagnosis Date   BPH (benign prostatic hyperplasia)    CTS (carpal tunnel syndrome)    Depression    Diabetes mellitus    Essential hypertension    GERD (gastroesophageal reflux disease)    History of breast cancer    Hypercholesteremia    Neuropathy    Past Surgical History:  Procedure Laterality Date   CATARACT EXTRACTION, BILATERAL  01/18/2017   LITHOTRIPSY     MASTECTOMY Right 05/02/2018   TONSILLECTOMY AND ADENOIDECTOMY     Patient Active Problem List   Diagnosis Date Noted   Type 2 diabetes mellitus with hyperglycemia, with long-term current use of insulin (HCC) 07/26/2022   Bronchitis 03/20/2021   Acute non-recurrent frontal sinusitis 03/20/2021   Viral URI with cough 01/13/2021   Hyperlipidemia 11/21/2020   Vertigo 10/20/2020   Localized swelling of both lower extremities 10/20/2020   Tendinitis of right hip flexor 10/20/2020   Aortic atherosclerosis (HCC) 09/10/2020   Insomnia 09/10/2020   Cognitive impairment 08/18/2020   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   Increasing residual urine 06/04/2020   History of COVID-19 12/10/2019   Physical deconditioning 12/10/2019   Fatigue 12/10/2019   Unsteady gait 12/10/2019   Pneumonia due to COVID-19 virus 10/26/2019   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   Essential hypertension 10/17/2019   Depression, major, single episode, complete remission (HCC)  10/17/2019   History of breast cancer    GERD (gastroesophageal reflux disease)    BPH (benign prostatic hyperplasia)    CTS (carpal tunnel syndrome)    Diabetic polyneuropathy associated with type 2 diabetes mellitus (HCC) 09/29/2019   Neuropathic pain of foot 09/29/2019   Port-A-Cath in place 01/03/2019   Peripheral sensory neuropathy 05/17/2018   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   Hypomagnesemia 01/18/2018   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) 12/13/2017   Axillary adenopathy 12/09/2017   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   S/P cataract extraction and insertion of intraocular lens 01/17/2017   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   PVD (posterior vitreous detachment), left 01/07/2017   Hyperopia of both eyes with astigmatism 11/09/2016   Class 1 obesity due to excess calories without serious comorbidity with body mass index (BMI) of 31.0 to 31.9 in adult 09/17/2016   Encounter for monitoring tamoxifen therapy 03/05/2016   Diabetes mellitus type 2 without retinopathy (HCC) 07/02/2015   Diabetes 1.5, 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   Palpitation 09/16/2014    PCP: Sharlene Dory, DO   REFERRING PROVIDER: Sharlene Dory*  REFERRING DIAG: R42 (ICD-10-CM) - Vertigo   THERAPY DIAG:  Dizziness and giddiness  ONSET DATE: ~ October 05, 2022  Rationale for Evaluation and Treatment: Rehabilitation  SUBJECTIVE:   SUBJECTIVE STATEMENT: Pt. Reports he  saw Dr. Carmelia Roller who thinks his high blood sugar is contributing to his condition.  No new falls, no new attacks or trips to ER.  Wife drove him today.  Feeling ok.  Pt accompanied by: self and significant other  PERTINENT HISTORY: GERD, HTN, H/o Breast cancer, Neuropathy associated with cancer.  History of TIAs, T2DM, HOH  PAIN:  Are you having pain? No  PRECAUTIONS: Fall  RED FLAGS: None   WEIGHT BEARING RESTRICTIONS:  No  FALLS: Has patient fallen in last 6 months? Yes. Number of falls 3 in last month  LIVING ENVIRONMENT: Lives with: lives with their spouse Lives in: House/apartment Stairs: Yes: Internal: 14 steps; on right going up and on left going up  sleeping in recliner  Has following equipment at home: Walker - 4 wheeled and walking pole  PLOF: Independent  PATIENT GOALS: do the things to fix the stuff in my head to fix the dizzy  OBJECTIVE:   DIAGNOSTIC FINDINGS:   11/08/22 MR Brain MPRESSION: 1. No acute intracranial abnormality. 2. 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  COGNITION: Overall cognitive status: Within functional limits for tasks assessed   SENSATION: History of   POSTURE:  No Significant postural limitations  Cervical ROM:    Active A/PROM (deg) eval  Flexion 30  Extension 35  Right lateral flexion 30  Left lateral flexion 30  Right rotation 75  Left rotation 70  (Blank rows = not tested)  STRENGTH: 5/5 bil UE and LE strength   GAIT: Gait pattern: WFL Distance walked: 71' Assistive device utilized:  walking pole Level of assistance: Modified independence Comments: visually slow gait, 0.25m/s  PATIENT SURVEYS:  DHI 28%  VESTIBULAR ASSESSMENT:  GENERAL OBSERVATION: enters with walking pole, needed increased time to stand, had to stand for~ 30 sec due to dizziness after standing.  Also became dizzy with cervical AROM looking down and head tilt.  Noted poor activity tolerance overall, after walking through clinic again and sitting on treatment table reported not feeling well enough to complete testing today, felt need to go home and rest for an hour.     SYMPTOM BEHAVIOR:  Subjective history: Symptoms started 1 month ago, with 4 severe attacks after bending over.    Non-Vestibular symptoms:  none reported.   Type of dizziness: Imbalance (Disequilibrium) and  Spinning/Vertigo  Frequency: 4 attacks over last visit  Duration: variable  Aggravating factors: Induced by position change: bending over  Relieving factors: slow movements and sitting still  Progression of symptoms: worse  OCULOMOTOR EXAM:  Ocular Alignment: normal  Ocular ROM:  decreased, compensates with head movements  Spontaneous Nystagmus: small horizontal nystagmus  Gaze-Induced Nystagmus: absent  Smooth Pursuits: saccades  Saccades:  with vertical eye movements  Convergence/Divergence: 9 cm   VESTIBULAR - OCULAR REFLEX:   Slow VOR: Normal  VOR Cancellation: Normal  Head-Impulse Test: HIT Right: negative HIT Left: negative  Dynamic Visual Acuity: NT   POSITIONAL TESTING: negative  MOTION SENSITIVITY:  Increased dizziness with standing up, bending over, quick head turns (flexion and SB to R)   11/17/2022 - no sensitivity today  OTHOSTATICS: 120/60 sitting, 110/60 standing after 2 minutes.   VESTIBULAR TREATMENT:  DATE:  11/17/2022 Therapeutic Activity:   Positional testing, VOR testing, Othostatics.   11/11/2022 Self Care: see patient education  PATIENT EDUCATION: Education details:  Person educated: Patient Education method: Medical illustrator Education comprehension: verbalized understanding  HOME EXERCISE PROGRAM:  GOALS: Goals reviewed with patient? Yes  SHORT TERM GOALS: Target date: 11/25/2022   Patient will report compliance with initial HEP.  Baseline: Goal status: INITIAL  2.  Patient will report resolution of dizziness bending over.   Baseline:  Goal status: INITIAL  3.  Patient will complete further balance testing.  Baseline:  Goal status: INITIAL  LONG TERM GOALS: Target date: 12/23/2022    Patient will be independent with progressed HEP to improve outcomes and carryover.  Baseline:  Goal status: INITIAL  2.  Patient will report  75% improvement in dizziness. Baseline:  Goal status: INITIAL  3.  Patient will demonstrate low risk of falls on appropriate test measure with goal to be determined to decrease risk of falls.  Baseline:  Goal status: INITIAL  4.  Patient will report 18 points improvement on DHI to demonstrate improved QOL. Baseline: 28% Goal status: INITIAL    ASSESSMENT:  CLINICAL IMPRESSION: Sharlene Dory  returned today to complete further testing for dizziness.  He reports no attacks and no falls since IE. He was negative for BPPV, with no dizziness or symptoms with Epley or side rolling tests, so posterior and horizontal canals were clear.  Noted slight nystagmus at rest, and saccades with vertical eye movement, but otherwise VOR testing unremarkable. Orthostatic testing had small drop on BP from 120/60 in sitting to 110/60 in standing, but no symptoms.  He reports he does typically stand 1 1/2-2 minutes before walking and does not feel this is contributory factor to dizziness spells.  He does have follow-up visit with ENT.  He was interested in working on balance, so will further assess balance next session.     OBJECTIVE IMPAIRMENTS: Abnormal gait, decreased activity tolerance, decreased balance, decreased mobility, decreased safety awareness, and dizziness.   ACTIVITY LIMITATIONS: bending, standing, stairs, and locomotion level  PARTICIPATION LIMITATIONS: cleaning, driving, shopping, and community activity  PERSONAL FACTORS: Age, Past/current experiences, Time since onset of injury/illness/exacerbation, and 3+ comorbidities: GERD, HTN, H/o Breast cancer, Neuropathy associated with cancer.  History of TIAs, T2DM, HOH  are also affecting patient's functional outcome.   REHAB POTENTIAL: Good  CLINICAL DECISION MAKING: Evolving/moderate complexity  EVALUATION COMPLEXITY: Moderate   PLAN:  PT FREQUENCY: 1-2x/week  PT DURATION: 6 weeks  PLANNED INTERVENTIONS: Therapeutic exercises,  Therapeutic activity, Neuromuscular re-education, Balance training, Gait training, Patient/Family education, Self Care, Joint mobilization, Stair training, Canalith repositioning, Visual/preceptual remediation/compensation, Dry Needling, Spinal mobilization, Manual therapy, and Re-evaluation  PLAN FOR NEXT SESSION: balance testing.    Jena Gauss, PT, DPT  11/17/2022, 5:15 PM

## 2022-11-17 NOTE — Telephone Encounter (Signed)
Pt's wife came in to drop off a note, which is in pcp's box upfront She would like some med refills and also requesting for him to get a "stair chair", stated the # to call is [phone] option 6. Note given to Robin.

## 2022-11-17 NOTE — Telephone Encounter (Signed)
Called left msg to call back. 

## 2022-11-17 NOTE — Telephone Encounter (Signed)
I do not think we order these

## 2022-11-19 NOTE — Telephone Encounter (Signed)
Called again and line busy

## 2022-11-19 NOTE — Telephone Encounter (Signed)
Called and line as busy unable to leave a msg.

## 2022-11-22 ENCOUNTER — Other Ambulatory Visit: Payer: Self-pay | Admitting: Family Medicine

## 2022-11-22 ENCOUNTER — Ambulatory Visit: Payer: Medicare Other | Admitting: Podiatry

## 2022-11-22 MED ORDER — ACCU-CHEK SOFTCLIX LANCETS MISC: 3 refills | Status: DC

## 2022-11-22 MED ORDER — ACCU-CHEK GUIDE W/DEVICE KIT: PACK | 0 refills | Status: DC

## 2022-11-22 NOTE — Telephone Encounter (Signed)
Patient wife informed of PCP response to request through my chart.

## 2022-11-23 ENCOUNTER — Ambulatory Visit: Payer: Medicare Other | Admitting: Physical Therapy

## 2022-11-23 ENCOUNTER — Encounter: Payer: Self-pay | Admitting: Physical Therapy

## 2022-11-23 DIAGNOSIS — R42 Dizziness and giddiness: Secondary | ICD-10-CM

## 2022-11-23 NOTE — Therapy (Addendum)
OUTPATIENT PHYSICAL THERAPY Treatment/Discharge Summary   Patient Name: John [name] MRN: [id number] DOB:Sep 12, 1945, 77 y.o., male Today's Date: 11/23/2022  END OF SESSION:  PT End of Session - 11/23/22 1208     Visit Number 3    Date for PT Re-Evaluation 12/23/22    Authorization Type [employer] M[employer]    Progress Note Due on Visit 10    PT Start Time 1105    PT Stop Time 1145    PT Time Calculation (min) 40 min    Activity Tolerance Patient tolerated treatment well    Behavior During Therapy WFL for tasks assessed/performed              Past Medical History:  Diagnosis Date   BPH (benign prostatic hyperplasia)    CTS (carpal tunnel syndrome)    Depression    Diabetes mellitus    Essential hypertension    GERD (gastroesophageal reflux disease)    History of breast cancer    Hypercholesteremia    Neuropathy    Past Surgical History:  Procedure Laterality Date   CATARACT EXTRACTION, BILATERAL  01/18/2017   LITHOTRIPSY     MASTECTOMY Right 05/02/2018   TONSILLECTOMY AND ADENOIDECTOMY     Patient Active Problem List   Diagnosis Date Noted   Type 2 diabetes mellitus with hyperglycemia, with long-term current use of insulin (HCC) 07/26/2022   Bronchitis 03/20/2021   Acute non-recurrent frontal sinusitis 03/20/2021   Viral URI with cough 01/13/2021   Hyperlipidemia 11/21/2020   Vertigo 10/20/2020   Localized swelling of both lower extremities 10/20/2020   Tendinitis of right hip flexor 10/20/2020   Aortic atherosclerosis (HCC) 09/10/2020   Insomnia 09/10/2020   Cognitive impairment 08/18/2020   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   Increasing residual urine 06/04/2020   History of COVID-19 12/10/2019   Physical deconditioning 12/10/2019   Fatigue 12/10/2019   Unsteady gait 12/10/2019   Pneumonia due to COVID-19 virus 10/26/2019   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   Essential hypertension 10/17/2019   Depression, major, single episode,  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/2019   History of breast cancer    GERD (gastroesophageal reflux disease)    BPH (benign prostatic hyperplasia)    CTS (carpal tunnel syndrome)    Diabetic polyneuropathy associated with type 2 diabetes mellitus (HCC) 09/29/2019   Neuropathic pain of foot 09/29/2019   Port-A-Cath in place 01/03/2019   Peripheral sensory neuropathy 05/17/2018   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   Hypomagnesemia 01/18/2018   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) 12/13/2017   Axillary adenopathy 12/09/2017   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   S/P cataract extraction and insertion of intraocular lens 01/17/2017   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   PVD (posterior vitreous detachment), left 01/07/2017   Hyperopia of both eyes with astigmatism 11/09/2016   Class 1 obesity due to excess calories without serious comorbidity with body mass index (BMI) of 31.0 to 31.9 in adult 09/17/2016   Encounter for monitoring tamoxifen therapy 03/05/2016   Diabetes mellitus type 2 without retinopathy (HCC) 07/02/2015   Diabetes 1.5, 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   Palpitation 09/16/2014    PCP: Sharlene Dory, DO   REFERRING PROVIDER: Sharlene Dory*  REFERRING DIAG: R42 (ICD-10-CM) - Vertigo   THERAPY DIAG:  Dizziness and giddiness  ONSET DATE: ~ October 05, 2022  Rationale for Evaluation and Treatment: Rehabilitation  SUBJECTIVE:   SUBJECTIVE STATEMENT: Pt.  Reports he bent over to pick up his hat Friday afternoon, became dizzy and fell again.  Pt accompanied by: self and significant other  PERTINENT HISTORY: GERD, HTN, H/o Breast cancer, Neuropathy associated with cancer.  History of TIAs, T2DM, HOH  PAIN:  Are you having pain? No  PRECAUTIONS: Fall  RED FLAGS: None   WEIGHT BEARING RESTRICTIONS: No  FALLS: Has patient fallen in last 6 months? Yes. Number of  falls 3 in last month  LIVING ENVIRONMENT: Lives with: lives with their spouse Lives in: House/apartment Stairs: Yes: Internal: 14 steps; on right going up and on left going up  sleeping in recliner  Has following equipment at home: Walker - 4 wheeled and walking pole  PLOF: Independent  PATIENT GOALS: do the things to fix the stuff in my head to fix the dizzy  OBJECTIVE:   DIAGNOSTIC FINDINGS:   11/08/22 MR Brain MPRESSION: 1. No acute intracranial abnormality. 2. 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  COGNITION: Overall cognitive status: Within functional limits for tasks assessed   SENSATION: History of   POSTURE:  No Significant postural limitations  Cervical ROM:    Active A/PROM (deg) eval  Flexion 30  Extension 35  Right lateral flexion 30  Left lateral flexion 30  Right rotation 75  Left rotation 70  (Blank rows = not tested)  STRENGTH: 5/5 bil UE and LE strength   GAIT: Gait pattern: WFL Distance walked: 28' Assistive device utilized:  walking pole Level of assistance: Modified independence Comments: visually slow gait, 0.23m/s  PATIENT SURVEYS:  DHI 28%  VESTIBULAR ASSESSMENT:  GENERAL OBSERVATION: enters with walking pole, needed increased time to stand, had to stand for~ 30 sec due to dizziness after standing.  Also became dizzy with cervical AROM looking down and head tilt.  Noted poor activity tolerance overall, after walking through clinic again and sitting on treatment table reported not feeling well enough to complete testing today, felt need to go home and rest for an hour.     SYMPTOM BEHAVIOR:  Subjective history: Symptoms started 1 month ago, with 4 severe attacks after bending over.    Non-Vestibular symptoms:  none reported.   Type of dizziness: Imbalance (Disequilibrium) and Spinning/Vertigo  Frequency: 4 attacks over last visit  Duration:  variable  Aggravating factors: Induced by position change: bending over  Relieving factors: slow movements and sitting still  Progression of symptoms: worse  OCULOMOTOR EXAM:  Ocular Alignment: normal  Ocular ROM:  decreased, compensates with head movements  Spontaneous Nystagmus: small horizontal nystagmus  Gaze-Induced Nystagmus: absent  Smooth Pursuits: saccades  Saccades:  with vertical eye movements  Convergence/Divergence: 9 cm   VESTIBULAR - OCULAR REFLEX:   Slow VOR: Normal  VOR Cancellation: Normal  Head-Impulse Test: HIT Right: negative HIT Left: negative  Dynamic Visual Acuity: NT   POSITIONAL TESTING: negative  MOTION SENSITIVITY:  Increased dizziness with standing up, bending over, quick head turns (flexion and SB to R)   11/17/2022 - no sensitivity today  OTHOSTATICS: 120/60 sitting, 110/60 standing after 2 minutes.    [facility] PT Assessment - 11/23/22 0001       Balance   Balance Assessed Yes      Standardized Balance Assessment   Standardized Balance Assessment Berg Balance Test      Berg Balance Test   Sit to Stand Able to stand without using hands and stabilize independently    Standing Unsupported  Able to stand safely 2 minutes    Sitting with Back Unsupported but Feet Supported on Floor or Stool Able to sit safely and securely 2 minutes    Stand to Sit Sits safely with minimal use of hands    Transfers Able to transfer safely, minor use of hands    Standing Unsupported with Eyes Closed Able to stand 10 seconds safely    Standing Unsupported with Feet Together Able to place feet together independently and stand 1 minute safely    From Standing, Reach Forward with Outstretched Arm Can reach forward >12 cm safely (5")    From Standing Position, Pick up Object from Floor Unable to try/needs assist to keep balance    From Standing Position, Turn to Look Behind Over each Shoulder Looks behind from both sides and weight shifts well    Turn 360 Degrees Able to  turn 360 degrees safely but slowly    Standing Unsupported, Alternately Place Feet on Step/Stool Able to complete >2 steps/needs minimal assist    Standing Unsupported, One Foot in Front Able to take small step independently and hold 30 seconds    Standing on One Leg Unable to try or needs assist to prevent fall    Total Score 40    Berg comment: significant risk for falls      Functional Gait  Assessment   Gait assessed  Yes    Gait Level Surface Walks 20 ft in less than 7 sec but greater than 5.5 sec, uses assistive device, slower speed, mild gait deviations, or deviates 6-10 in outside of the 12 in walkway width.    Change in Gait Speed Able to smoothly change walking speed without loss of balance or gait deviation. Deviate no more than 6 in outside of the 12 in walkway width.    Gait with Horizontal Head Turns Performs head turns with moderate changes in gait velocity, slows down, deviates 10-15 in outside 12 in walkway width but recovers, can continue to walk.    Gait with Vertical Head Turns Performs task with moderate change in gait velocity, slows down, deviates 10-15 in outside 12 in walkway width but recovers, can continue to walk.    Gait and Pivot Turn Pivot turns safely within 3 sec and stops quickly with no loss of balance.    Step Over Obstacle Is able to step over one shoe box (4.5 in total height) without changing gait speed. No evidence of imbalance.    Gait with Narrow Base of Support Ambulates less than 4 steps heel to toe or cannot perform without assistance.    Gait with Eyes Closed Walks 20 ft, slow speed, abnormal gait pattern, evidence for imbalance, deviates 10-15 in outside 12 in walkway width. Requires more than 9 sec to ambulate 20 ft.    Ambulating Backwards Walks 20 ft, slow speed, abnormal gait pattern, evidence for imbalance, deviates 10-15 in outside 12 in walkway width.    Steps Two feet to a stair, must use rail.    Total Score 15    FGA comment: high risk for  falls.             VESTIBULAR TREATMENT:  DATE:  11/23/22 Therapeutic Activity:   FGA 15/30 BERG 40/56 Corner balance for tandem stance At counter - tandem stance and single leg stance for HEP Education on safety, use of cane, use of reacher.    11/17/2022 Therapeutic Activity:   Positional testing, VOR testing, [employer].   11/11/2022 Self Care: see patient education  PATIENT EDUCATION: Education details:  Person educated: Patient Education method: Medical illustrator Education comprehension: verbalized understanding  HOME EXERCISE PROGRAM: Access Code: [email or web address] URL: [email or web address] Date: 11/23/2022 Prepared by: Harrie Foreman  Exercises - Standing Tandem Balance with Counter Support  - 1 x daily - 7 x weekly - 1 sets - 2 reps - 15 sec hold - Standing Single Leg Stance with Counter Support  - 1 x daily - 7 x weekly - 1 sets - 2 reps - 30 sec-1 min hold  GOALS: Goals reviewed with patient? Yes  SHORT TERM GOALS: Target date: 11/25/2022   Patient will report compliance with initial HEP.  Baseline: Goal status: IN PROGRESS 11/23/22 -given  2.  Patient will report resolution of dizziness bending over.   Baseline:  Goal status: IN PROGRESS 11/23/22 - dizzy when bends over, instructed not not bend over.   3.  Patient will complete further balance testing.  Baseline:  Goal status: MET 11/23/22 - completed DGI and BERG  LONG TERM GOALS: Target date: 12/23/2022    Patient will be independent with progressed HEP to improve outcomes and carryover.  Baseline:  Goal status: IN PROGRESS  2.  Patient will report 75% improvement in dizziness. Baseline:  Goal status: IN PROGRESS  3.  Patient will demonstrate low risk of falls on appropriate test measure with goal to be determined to decrease risk of falls.  Baseline:  Goal status: IN  PROGRESS  4.  Patient will report 18 points improvement on DHI to demonstrate improved QOL. Baseline: 28% Goal status: IN PROGRESS    ASSESSMENT:  CLINICAL IMPRESSION: Sharlene Dory reported another fall after bending forward while standing to retreive his hat.  Since bending forward is a known trigger, he was instructed not to bend forward and use a reacher (he reports having "6 or 7 around the house") instead to retrieve items.  Today completed further balance assessments, he demonstrated high risk of falls with both FGA and BERG, discussed using a cane for safety, especially considering his history of neuropathy, he reports he frequently does use his cane for first 5 or 6 steps but then he's "fine" and doesn't need it anymore, encouraged to continue using to help steady and provide another point of reference due to decreased proprioception, he was not receptive to idea.  Also provided HEP for tandem stance and single leg stance starting with bil UE support on counter for safety, after trying in corner but he did not feel comfortable. Sharlene Dory continues to demonstrate potential for improvement and would benefit from continued skilled therapy to address impairments.       OBJECTIVE IMPAIRMENTS: Abnormal gait, decreased activity tolerance, decreased balance, decreased mobility, decreased safety awareness, and dizziness.   ACTIVITY LIMITATIONS: bending, standing, stairs, and locomotion level  PARTICIPATION LIMITATIONS: cleaning, driving, shopping, and community activity  PERSONAL FACTORS: Age, Past/current experiences, Time since onset of injury/illness/exacerbation, and 3+ comorbidities: GERD, HTN, H/o Breast cancer, Neuropathy associated with cancer.  History of TIAs, T2DM, HOH  are also affecting patient's functional outcome.   REHAB POTENTIAL: Good  CLINICAL DECISION MAKING: Evolving/moderate complexity  EVALUATION COMPLEXITY: Moderate   PLAN:  PT FREQUENCY:  1-2x/week  PT DURATION: 6 weeks  PLANNED INTERVENTIONS: Therapeutic exercises, Therapeutic activity, Neuromuscular re-education, Balance training, Gait training, Patient/Family education, Self Care, Joint mobilization, Stair training, Canalith repositioning, Visual/preceptual remediation/compensation, Dry Needling, Spinal mobilization, Manual therapy, and Re-evaluation  PLAN FOR NEXT SESSION: balance testing.    Jena Gauss, PT, DPT  11/23/2022, 12:11 PM   PHYSICAL THERAPY DISCHARGE SUMMARY  Visits from Start of Care: 3  Current functional level related to goals / functional outcomes: Noted in chart review seen 12/20/22 by ENT has not had further episodes of vertigo.    Remaining deficits: Impaired balance   Education / Equipment: HEP  Plan: John [name] was last seen on 11/23/22, since he has not been seen for more than 30 days, will proceed with discharge from PT for this episode.      Jena Gauss, PT, DPT 01/19/2023 10:01 AM

## 2022-11-25 ENCOUNTER — Encounter (INDEPENDENT_AMBULATORY_CARE_PROVIDER_SITE_OTHER): Payer: Self-pay

## 2022-11-29 DIAGNOSIS — H35373 Puckering of macula, bilateral: Secondary | ICD-10-CM | POA: Diagnosis not present

## 2022-11-29 DIAGNOSIS — Z961 Presence of intraocular lens: Secondary | ICD-10-CM | POA: Diagnosis not present

## 2022-11-29 LAB — HM DIABETES EYE EXAM

## 2022-11-30 ENCOUNTER — Encounter: Payer: Self-pay | Admitting: Family Medicine

## 2022-12-05 ENCOUNTER — Other Ambulatory Visit: Payer: Self-pay | Admitting: Family Medicine

## 2022-12-05 DIAGNOSIS — M7989 Other specified soft tissue disorders: Secondary | ICD-10-CM

## 2022-12-09 ENCOUNTER — Institutional Professional Consult (permissible substitution) (INDEPENDENT_AMBULATORY_CARE_PROVIDER_SITE_OTHER): Payer: Medicare Other | Admitting: Otolaryngology

## 2022-12-09 ENCOUNTER — Encounter: Payer: Self-pay | Admitting: Family Medicine

## 2022-12-10 ENCOUNTER — Other Ambulatory Visit: Payer: Self-pay | Admitting: Family Medicine

## 2022-12-10 MED ORDER — FUROSEMIDE 80 MG PO TABS: 80.0000 mg | ORAL_TABLET | Freq: Every day | ORAL | 1 refills | Status: DC

## 2022-12-11 ENCOUNTER — Other Ambulatory Visit: Payer: Self-pay | Admitting: Family Medicine

## 2022-12-11 DIAGNOSIS — M545 Low back pain, unspecified: Secondary | ICD-10-CM

## 2022-12-17 ENCOUNTER — Encounter: Payer: Self-pay | Admitting: Family Medicine

## 2022-12-17 ENCOUNTER — Ambulatory Visit (INDEPENDENT_AMBULATORY_CARE_PROVIDER_SITE_OTHER): Payer: Medicare Other | Admitting: Family Medicine

## 2022-12-17 VITALS — BP 126/78 | HR 91 | Temp 97.9°F | Ht 72.0 in | Wt 245.4 lb

## 2022-12-17 DIAGNOSIS — Z794 Long term (current) use of insulin: Secondary | ICD-10-CM | POA: Diagnosis not present

## 2022-12-17 DIAGNOSIS — E1165 Type 2 diabetes mellitus with hyperglycemia: Secondary | ICD-10-CM

## 2022-12-17 LAB — GLUCOSE, POCT (MANUAL RESULT ENTRY): POC Glucose: 310 mg/dL — AB (ref 70–99)

## 2022-12-17 MED ORDER — TOUJEO MAX SOLOSTAR 300 UNIT/ML ~~LOC~~ SOPN
70.0000 [IU] | PEN_INJECTOR | Freq: Every day | SUBCUTANEOUS | Status: DC
Start: 2022-12-17 — End: 2023-04-12

## 2022-12-17 MED ORDER — TIRZEPATIDE 2.5 MG/0.5ML ~~LOC~~ SOPN
2.5000 mg | PEN_INJECTOR | SUBCUTANEOUS | 0 refills | Status: AC
Start: 2022-12-17 — End: 2023-01-14

## 2022-12-17 MED ORDER — TIRZEPATIDE 5 MG/0.5ML ~~LOC~~ SOAJ: 5.0000 mg | SUBCUTANEOUS | 0 refills | Status: DC

## 2022-12-17 NOTE — Patient Instructions (Addendum)
Keep the diet clean and stay active.  Aim to do some physical exertion for 150 minutes per week. This is typically divided into 5 days per week, 30 minutes per day. The activity should be enough to get your heart rate up. Anything is better than nothing if you have time constraints.  Let me know if there are cost issues with the new medicine.   Please call: [phone] for your endocrinology appointment.   Let us know if you need anything.

## 2022-12-17 NOTE — Progress Notes (Signed)
Chief Complaint  Patient presents with   Follow-up    1 month     Subjective: Patient is a 77 y.o. male here for DM ck.  Checked sugars 2x since last visit, was fasting. 117 and 119.  Diet has been improving and he is cutting down on his Pepsi intake.  He is not exercising routinely.  No chest pain or shortness of breath.  He is not compliant with his medication.  He usually remembers his morning doses but does not always remember his evening doses of Toujeo 50 units twice daily or his mealtime short acting insulin 25 units.  He is also taking metformin XR 1000 mg twice daily and sometimes will forget the evening dose.  Past Medical History:  Diagnosis Date   BPH (benign prostatic hyperplasia)    CTS (carpal tunnel syndrome)    Depression    Diabetes mellitus    Essential hypertension    GERD (gastroesophageal reflux disease)    History of breast cancer    Hypercholesteremia    Neuropathy     Objective: BP 126/78 (BP Location: Left Arm, Patient Position: Sitting, Cuff Size: Normal)   Pulse 91   Temp 97.9 F (36.6 C) (Oral)   Ht 6' (1.829 m)   Wt 245 lb 6 oz (111.3 kg)   SpO2 96%   BMI 33.28 kg/m  General: Awake, appears stated age Lungs: No accessory muscle use Psych: Age appropriate judgment and insight, normal affect and mood  Assessment and Plan: Type 2 diabetes mellitus with hyperglycemia, with long-term current use of insulin (HCC) - Plan: tirzepatide (MOUNJARO) 2.5 MG/0.5ML Pen, tirzepatide (MOUNJARO) 5 MG/0.5ML Pen, TOUJEO MAX SOLOSTAR 300 UNIT/ML Solostar Pen  Chronic, uncontrolled.  Will stop twice daily dosing of Toujeo as he is not remembering to take it routinely.  Shift to morning dosing only at 70 units.  Will add Mounjaro 2.5 mg weekly and uptitrate.  He will let me know if there are cost/supply issues.  Counseled on diet and exercise.  Monitor sugars, send the readings in 2 weeks.  Follow-up in 1 month.  Endocrinology number provided in paperwork. The  patient voiced understanding and agreement to the plan.  Jilda Roche [place], DO 12/17/22  1:45 PM

## 2022-12-17 NOTE — Addendum Note (Signed)
Addended by: Scharlene Gloss B on: 12/17/2022 02:00 PM   Modules accepted: Orders

## 2022-12-20 ENCOUNTER — Encounter (INDEPENDENT_AMBULATORY_CARE_PROVIDER_SITE_OTHER): Payer: Self-pay | Admitting: Otolaryngology

## 2022-12-20 ENCOUNTER — Ambulatory Visit (INDEPENDENT_AMBULATORY_CARE_PROVIDER_SITE_OTHER): Payer: Medicare Other | Admitting: Otolaryngology

## 2022-12-20 VITALS — BP 161/73 | HR 87 | Ht 72.0 in | Wt 243.0 lb

## 2022-12-20 DIAGNOSIS — G43109 Migraine with aura, not intractable, without status migrainosus: Secondary | ICD-10-CM

## 2022-12-20 DIAGNOSIS — H903 Sensorineural hearing loss, bilateral: Secondary | ICD-10-CM | POA: Diagnosis not present

## 2022-12-20 DIAGNOSIS — R42 Dizziness and giddiness: Secondary | ICD-10-CM

## 2022-12-20 NOTE — Progress Notes (Signed)
ENT CONSULT:  Reason for Consult: Dizziness and vertigo, longstanding  HPI: John [name] is an 77 y.o. male with hx of male breast cancer status post chemoradiation 2019, HTN, depression, GERD, BPH, T2DM, cognitive impairment, neuropathy, breast cancer who is here for evaluation of recurrent episodes of dizziness and vertigo. He states that he went to the ED end of July for episodes of dizziness and vertigo that resulted in the fall triggered with changes in body position lasting half an hour to several hours.  He took meclizine a couple of times after being discharged from the ED but it did not make any difference in the severity of his symptoms.  He was sent to vestibular rehab, initially did not tolerate Dix-Hallpike or Epley repositioning maneuver due to severity of his symptoms but subsequently was seen again and based on results his presentation did not appear to be due to BPPV based on PT records available for review. His PCP sent him to Endocrinology because of the concern for orthostatic hypotension and dehydration, but he reports that he did not see endocrinology.  History of breast cancer treated with surgery and radiation.  His last episode of vertigo and dizziness was over 6 weeks ago.  He has not had any episodes since then. During the episodes he would get up and out of his chair and the world i would be spinning - lasting 20 min to 30 hrs. He had ocular migraine in the left eye 20 yrs ago, lasted 5-6 months, would happen every 28 days, last 3-4 minutes - saw Neuro-ophtalmologist and had testing.  Denies history of migraine headaches otherwise.   Records reviewed: Vestibular rehab physical therapy records   Two months ago, stood up world spins and fall. Happened 6 times. Went to vestibular rehab, couldn't recreate symptoms. 5 sessions.    Episodes last 3 hours. Triggered by sitting to standing and moving quickly. Cannot recreate while sitting. No associated symptoms ( cardiac neuro).  No N/V. No hearing changes.    No episodes in 5 weeks   No history of URI, allergies as child none now. No new medications. No trauma. No head or neck surgeries.    Male breast cancer, SP chemo,radiation, 2019      Records Reviewed:  Vestibular rehab  John [name]  is a 77 y.o. male who was seen today for physical therapy evaluation and treatment for vertigo.  He reports 4 separate attacks over last month resulting in falls and trip to [facility] via ambulance with last attack.  MRI ruled out CVA or TIA, impression was mostly likely peripheral vertigo.  Noted today extremely sensitive to head movements, becoming dizzy standing up or moving head quickly or bending over.  Tilting head to right also produced symptoms.  Unfortunately, just this was taxing enough that he did not feel up to positional testing and treatment for BPPV today, so we discussed taking meclizine before next session to help suppress vestibular response and improve tolerance as well as have wife drive in case dizzy afterwards for safety.  Discussed other causes of dizziness and how they are addressed in vestibular rehab, but considering symptoms observed today BPPV is a likely diagnosis.  John [name] would benefit from skilled physical therapy to decrease dizziness, improve safety and prevent falls with injury.       PLANNED INTERVENTIONS: Therapeutic exercises, Therapeutic activity, Neuromuscular re-education, Balance training, Gait training, Patient/Family education, Self Care, Joint mobilization, Stair training, Canalith repositioning, Visual/preceptual remediation/compensation, Dry Needling, Spinal  mobilization, Manual therapy, and Re-evaluation ' PT f/u appt 11/17/22  John [name]  returned today to complete further testing for dizziness.  He reports no attacks and no falls since IE. He was negative for BPPV, with no dizziness or symptoms with Epley or side rolling tests, so posterior and horizontal canals were  clear.  Noted slight nystagmus at rest, and saccades with vertical eye movement, but otherwise VOR testing unremarkable. Orthostatic testing had small drop on BP from 120/60 in sitting to 110/60 in standing, but no symptoms.  He reports he does typically stand 1 1/2-2 minutes before walking and does not feel this is contributory factor to dizziness spells.  He does have follow-up visit with ENT.  He was interested in working on balance, so will further assess balance next session.     Past Medical History:  Diagnosis Date   BPH (benign prostatic hyperplasia)    CTS (carpal tunnel syndrome)    Depression    Diabetes mellitus    Essential hypertension    GERD (gastroesophageal reflux disease)    History of breast cancer    Hypercholesteremia    Neuropathy     Past Surgical History:  Procedure Laterality Date   CATARACT EXTRACTION, BILATERAL  01/18/2017   LITHOTRIPSY     MASTECTOMY Right 05/02/2018   TONSILLECTOMY AND ADENOIDECTOMY      Family History  Problem Relation Age of Onset   Lymphoma Mother    Cancer Mother    Diabetes Father    Stroke Father    Ovarian cancer Sister    Cancer Sister    Cancer Paternal Grandmother     Social History:  reports that he has never smoked. He has never used smokeless tobacco. He reports that he does not drink alcohol and does not use drugs.  Allergies:  Allergies  Allergen Reactions   Ramipril Anaphylaxis   Other Diarrhea    Severe intolerance to Chemotherapy in the past.   Sertraline Other (See Comments)    "Extreme headaches"   Adhesive [Tape] Rash    Medications: I have reviewed the patient's current medications.  The PMH, PSH, Medications, Allergies, and SH were reviewed and updated.  ROS: Constitutional: Negative for fever, weight loss and weight gain. Cardiovascular: Negative for chest pain and dyspnea on exertion. Respiratory: Is not experiencing shortness of breath at rest. Gastrointestinal: Negative for nausea and  vomiting. Neurological: Negative for headaches. Psychiatric: The patient is not nervous/anxious  Blood pressure (!) 161/73, pulse 87, height 6' (1.829 m), weight 243 lb (110.2 kg), SpO2 96%.  PHYSICAL EXAM:  Exam: General: Well-developed, well-nourished Respiratory Respiratory effort: Equal inspiration and expiration without stridor Cardiovascular Peripheral Vascular: Warm extremities with equal color/perfusion Eyes: No nystagmus with equal extraocular motion bilaterally Neuro/Psych/Balance: Patient oriented to person, place, and time; Appropriate mood and affect; Gait is intact with no imbalance; Cranial nerves I-XII are intact Head and Face Inspection: Normocephalic and atraumatic without mass or lesion Facial Strength: Facial motility symmetric and full bilaterally ENT Pinna: External ear intact and fully developed External canal: Canal is patent with intact skin Tympanic Membrane: Clear and mobile External Nose: No scar or anatomic deformity Lips, Teeth, and gums: Mucosa and teeth intact and viable TMJ: No pain to palpation with full mobility Oral cavity/oropharynx: No erythema or exudate, no lesions present Nasopharynx: No mass or lesion with intact mucosa Neck Neck and Trachea: Midline trachea without mass or lesion Thyroid: No mass or nodularity Lymphatics: No lymphadenopathy  Procedure:none  Studies Reviewed:  MRI brain with 11/08/22 FINDINGS: Brain: No acute infarct, mass effect or extra-axial collection. No acute or chronic hemorrhage. There is multifocal hyperintense T2-weighted signal within the white matter. Parenchymal volume and CSF spaces are normal. 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 The midline structures are normal. There is no abnormal contrast enhancement.   Vascular: Major flow voids are preserved.   Skull and upper cervical spine: Normal calvarium and skull base. Visualized upper cervical spine and soft tissues are  normal.   Sinuses/Orbits:No paranasal sinus fluid levels or advanced mucosal thickening. No mastoid or middle ear effusion. Normal orbits.   IMPRESSION: 1. No acute intracranial abnormality. 2. 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  Assessment/Plan: Encounter Diagnoses  Name Primary?   Vertigo Yes   Sensorineural hearing loss (SNHL) of both ears    Dizziness    Ocular migraine    77 year old male with history of male breast cancer status post chemoradiation in 2019, here for episodes of dizziness and vertigo which started end of July and appeared to have stopped approximately 5 to 6 weeks ago, has been through vestibular rehab and per record review Dix-Hallpike was negative.  Reports history of ocular migraines 20 years ago, no migraine headaches.  Per records by his PCP Dr. Carmelia Roller there was a concern about dehydration and the need to see endocrinology, but the patient decided to defer that consultation.  Based on the symptom duration and description today he appears to have dizziness and vertigo that was triggered by changes in body position and lasting for half an hour to several hours in the row.  The differential diagnosis is fairly broad and also includes nonear related etiologies, including atypical migraine, we also discussed the possibility of CPA lesion that was not detected on recent brain MRI due to small size.  He has bilateral sensorineural hearing loss and wears hearing aids but has not had any recent hearing evaluation.  We discussed the value of objective vestibular testing but I advised the patient that [facility] would have to [facility] and he prefers not to go to [facility] for his care.  He did not find vestibular therapy beneficial, but currently denies any recent episodes of vertigo.  - schedule hearing test and MRI of the IAC  - referral to Neurology for atypical migraine workup -  return after testing  -The patient would like to wait and see if his symptoms come back and we will schedule imaging and consultations above if symptoms return  Thank you for allowing me to participate in the care of this patient. Please do not hesitate to contact me with any questions or concerns.   Ashok Croon, MD Otolaryngology [facility] Phone: [phone] Fax: [phone]    12/20/2022, 8:24 PM

## 2022-12-20 NOTE — Patient Instructions (Signed)
-   schedule hearing test and MRI of the IAC  - return after testing

## 2022-12-21 ENCOUNTER — Other Ambulatory Visit (HOSPITAL_COMMUNITY): Payer: Self-pay

## 2022-12-22 ENCOUNTER — Other Ambulatory Visit (HOSPITAL_COMMUNITY): Payer: Self-pay

## 2022-12-22 ENCOUNTER — Telehealth: Payer: Self-pay

## 2022-12-22 NOTE — Telephone Encounter (Signed)
Pharmacy Patient Advocate Encounter   Received notification from Physician's Office that prior authorization for [facility] is required/requested.   Insurance verification completed.   The patient is insured through [facility] .   Per test claim: PA required; PA submitted to [employer] via CoverMyMeds Key/confirmation #/EOC Key: [id number] Status is pending

## 2022-12-24 ENCOUNTER — Other Ambulatory Visit (HOSPITAL_COMMUNITY): Payer: Self-pay

## 2022-12-24 NOTE — Telephone Encounter (Signed)
Patient informed. 

## 2022-12-24 NOTE — Telephone Encounter (Signed)
Pharmacy Patient Advocate Encounter  Received notification from [facility] that Prior Authorization for [facility] 2.5mg  has been APPROVED from 9.12.24 to 9.12.25. Ran test claim, Copay is $253.46. This test claim was processed through [facility]- copay amounts may vary at other pharmacies due to pharmacy/plan contracts, or as the patient moves through the different stages of their insurance plan.   PA #/Case ID/Reference #: Key: [id number]

## 2022-12-28 ENCOUNTER — Telehealth: Payer: Self-pay

## 2022-12-28 NOTE — Telephone Encounter (Addendum)
[id number]       Authorized CPT code 25053  Approval Valid Through: 12/28/2022 - 01/26/2023

## 2022-12-30 DIAGNOSIS — H52223 Regular astigmatism, bilateral: Secondary | ICD-10-CM | POA: Diagnosis not present

## 2023-01-07 DIAGNOSIS — K080 Exfoliation of teeth due to systemic causes: Secondary | ICD-10-CM | POA: Diagnosis not present

## 2023-01-10 DIAGNOSIS — K080 Exfoliation of teeth due to systemic causes: Secondary | ICD-10-CM | POA: Diagnosis not present

## 2023-01-19 DIAGNOSIS — K080 Exfoliation of teeth due to systemic causes: Secondary | ICD-10-CM | POA: Diagnosis not present

## 2023-01-20 DIAGNOSIS — K080 Exfoliation of teeth due to systemic causes: Secondary | ICD-10-CM | POA: Diagnosis not present

## 2023-01-24 DIAGNOSIS — C50921 Malignant neoplasm of unspecified site of right male breast: Secondary | ICD-10-CM | POA: Diagnosis not present

## 2023-01-25 DIAGNOSIS — K080 Exfoliation of teeth due to systemic causes: Secondary | ICD-10-CM | POA: Diagnosis not present

## 2023-01-28 ENCOUNTER — Encounter: Payer: Self-pay | Admitting: Family Medicine

## 2023-01-28 ENCOUNTER — Ambulatory Visit: Payer: Medicare Other | Admitting: Family Medicine

## 2023-01-28 VITALS — BP 124/78 | HR 83 | Temp 98.0°F | Resp 16 | Ht 73.0 in | Wt 253.0 lb

## 2023-01-28 DIAGNOSIS — E119 Type 2 diabetes mellitus without complications: Secondary | ICD-10-CM | POA: Diagnosis not present

## 2023-01-28 DIAGNOSIS — Z23 Encounter for immunization: Secondary | ICD-10-CM | POA: Diagnosis not present

## 2023-01-28 DIAGNOSIS — Z794 Long term (current) use of insulin: Secondary | ICD-10-CM | POA: Diagnosis not present

## 2023-01-28 MED ORDER — OZEMPIC (0.25 OR 0.5 MG/DOSE) 2 MG/1.5ML ~~LOC~~ SOPN: PEN_INJECTOR | SUBCUTANEOUS | 1 refills | Status: AC

## 2023-01-28 NOTE — Patient Instructions (Signed)
Strong work with improving your sugars.   Check your sugars around 2-3 times per week. Alternate checking in the morning before you eat, in the afternoon and before bed. Write them down and bring it to your next appointment.   Let me know if there are cost issues with the injection.   Let us know if you need anything.

## 2023-01-28 NOTE — Progress Notes (Signed)
Subjective:   Chief Complaint  Patient presents with   Follow-up    Follow up    John [name] is a 77 y.o. male here for follow-up of diabetes.   Jarmel's self monitored glucose range is 100's.  Patient denies hypoglycemic reactions. He checks his glucose levels quite rarely Patient does require insulin.   Medications include: metformin XR 1000 mg bid Diet is better.  Exercise: none  Past Medical History:  Diagnosis Date   BPH (benign prostatic hyperplasia)    CTS (carpal tunnel syndrome)    Depression    Diabetes mellitus    Essential hypertension    GERD (gastroesophageal reflux disease)    History of breast cancer    Hypercholesteremia    Neuropathy      Related testing: Retinal exam: Done Pneumovax: done  Objective:  BP 124/78 (BP Location: Left Arm, Patient Position: Sitting, Cuff Size: Normal)   Pulse 83   Temp 98 F (36.7 C) (Oral)   Resp 16   Ht 6\' 1"  (1.854 m)   Wt 253 lb (114.8 kg)   SpO2 96%   BMI 33.38 kg/m  General:  Well developed, well nourished, in no apparent distress Skin:  Warm, no pallor or diaphoresis on exposed surfaces Lungs:  CTAB, no access msc use Cardio:  RRR, no bruits, 2+ pitting bilateral LE edema tapering at the knees Psych: Age appropriate judgment and insight  Assessment:   Diabetes mellitus type 2 without retinopathy (HCC)   Plan:   Chronic, uncontrolled.  Sugar today is 313.  Cont Toujeo 70 units daily, Humalog 25 units with meals, metformin XR 1000 mg twice daily.  Add Ozempic 0.25 mg weekly for 4 weeks and then increase to 0.5 mg weekly.  Counseled on diet and exercise.  Continue checking sugars at home perhaps make it more frequent/routine.  Greggory Keen was unfortunately not covered. F/u in 2 mo. The patient voiced understanding and agreement to the plan.  Jilda Roche [place], DO 01/28/23 2:50 PM

## 2023-01-31 DIAGNOSIS — R92313 Mammographic fatty tissue density, bilateral breasts: Secondary | ICD-10-CM | POA: Diagnosis not present

## 2023-01-31 DIAGNOSIS — Z853 Personal history of malignant neoplasm of breast: Secondary | ICD-10-CM | POA: Diagnosis not present

## 2023-01-31 DIAGNOSIS — Z9011 Acquired absence of right breast and nipple: Secondary | ICD-10-CM | POA: Diagnosis not present

## 2023-02-15 ENCOUNTER — Other Ambulatory Visit: Payer: Self-pay | Admitting: Family Medicine

## 2023-02-21 ENCOUNTER — Ambulatory Visit: Payer: Medicare Other | Admitting: Podiatry

## 2023-02-21 ENCOUNTER — Encounter: Payer: Self-pay | Admitting: Podiatry

## 2023-02-21 DIAGNOSIS — Z794 Long term (current) use of insulin: Secondary | ICD-10-CM | POA: Diagnosis not present

## 2023-02-21 DIAGNOSIS — Z87898 Personal history of other specified conditions: Secondary | ICD-10-CM | POA: Diagnosis not present

## 2023-02-21 DIAGNOSIS — E1142 Type 2 diabetes mellitus with diabetic polyneuropathy: Secondary | ICD-10-CM | POA: Diagnosis not present

## 2023-02-21 DIAGNOSIS — M2041 Other hammer toe(s) (acquired), right foot: Secondary | ICD-10-CM | POA: Diagnosis not present

## 2023-02-21 DIAGNOSIS — M2042 Other hammer toe(s) (acquired), left foot: Secondary | ICD-10-CM

## 2023-02-21 NOTE — Progress Notes (Signed)
  Subjective:  Patient ID: John [name], male    DOB: 07-29-1945,   MRN: [id number]  Chief Complaint  Patient presents with   Foot Ulcer    Follow up ulcer 5th toe left   "Its doing fine, I haven't had anymore trouble out of it"    77 y.o. male presents for follow-up of left fifth toe wound. .Doing very well without any issues. Relates burning and tingling in their feet. Patient is diabetic and last A1c was  Lab Results  Component Value Date   HGBA1C 10.5 (H) 09/10/2020   .   PCP:  John Dory, DO   Denies any other pedal complaints. Denies n/v/f/c.   Past Medical History:  Diagnosis Date   BPH (benign prostatic hyperplasia)    CTS (carpal tunnel syndrome)    Depression    Diabetes mellitus    Essential hypertension    GERD (gastroesophageal reflux disease)    History of breast cancer    Hypercholesteremia    Neuropathy     Objective:  Physical Exam: Vascular: DP/PT pulses 2/4 bilateral. CFT <3 seconds. Absent hair growth on digits. Edema noted to bilateral lower extremities. Xerosis noted bilaterally.  Skin. No lacerations or abrasions bilateral feet. Lateral dorsal fifth digit wound healed. Overlying hyperkeratosis noted with underlying ulceration healed.  Musculoskeletal: MMT 5/5 bilateral lower extremities in DF, PF, Inversion and Eversion. Deceased ROM in DF of ankle joint.  Neurological: Sensation intact to light touch. Protective sensation diminished bilateral.    Assessment:   1. Type 2 diabetes mellitus with diabetic polyneuropathy, with long-term current use of insulin (HCC)   2. History of ulceration   3. Hammertoe, bilateral         Plan:  Patient was evaluated and treated and all questions answered. Ulcer remains healed.  -Discussed and educated patient on diabetic foot care, especially with  regards to the vascular, neurological and musculoskeletal systems.  -Stressed the importance of good glycemic control and the detriment of not   controlling glucose levels in relation to the foot. -Discussed supportive shoes at all times and checking feet regularly.  -DM shoes ordered.  -Answered all patient questions -Patient to return  in 1 year for DM foot check.  -Patient advised to call the office if any problems or questions arise in the meantime.    Return in about 1 year (around 02/21/2024) for diabetic foot check.   Louann Sjogren, DPM

## 2023-02-22 DIAGNOSIS — G608 Other hereditary and idiopathic neuropathies: Secondary | ICD-10-CM | POA: Diagnosis not present

## 2023-02-22 DIAGNOSIS — Z5181 Encounter for therapeutic drug level monitoring: Secondary | ICD-10-CM | POA: Diagnosis not present

## 2023-02-22 DIAGNOSIS — Z79810 Long term (current) use of selective estrogen receptor modulators (SERMs): Secondary | ICD-10-CM | POA: Diagnosis not present

## 2023-02-22 DIAGNOSIS — C50921 Malignant neoplasm of unspecified site of right male breast: Secondary | ICD-10-CM | POA: Diagnosis not present

## 2023-03-23 ENCOUNTER — Other Ambulatory Visit: Payer: Medicare Other

## 2023-03-24 ENCOUNTER — Encounter: Payer: Self-pay | Admitting: Podiatry

## 2023-03-30 ENCOUNTER — Ambulatory Visit: Payer: Medicare Other | Admitting: Family Medicine

## 2023-04-03 ENCOUNTER — Other Ambulatory Visit: Payer: Self-pay | Admitting: Family Medicine

## 2023-04-03 DIAGNOSIS — M545 Low back pain, unspecified: Secondary | ICD-10-CM

## 2023-04-11 ENCOUNTER — Encounter: Payer: Self-pay | Admitting: Family Medicine

## 2023-04-11 ENCOUNTER — Ambulatory Visit (INDEPENDENT_AMBULATORY_CARE_PROVIDER_SITE_OTHER): Payer: Medicare Other | Admitting: Family Medicine

## 2023-04-11 VITALS — BP 126/80 | HR 84 | Temp 98.0°F | Resp 16 | Ht 73.0 in | Wt 237.2 lb

## 2023-04-11 DIAGNOSIS — R5383 Other fatigue: Secondary | ICD-10-CM

## 2023-04-11 DIAGNOSIS — R0609 Other forms of dyspnea: Secondary | ICD-10-CM | POA: Diagnosis not present

## 2023-04-11 DIAGNOSIS — E139 Other specified diabetes mellitus without complications: Secondary | ICD-10-CM

## 2023-04-11 MED ORDER — NYSTATIN 100000 UNIT/GM EX CREA: 1.0000 | TOPICAL_CREAM | Freq: Two times a day (BID) | CUTANEOUS | 0 refills | Status: DC

## 2023-04-11 NOTE — Patient Instructions (Addendum)
Give [place] 2-3 business days to get the results of your labs back.   Keep the diet clean and stay active.  Please get your X-ray done at the [facility] in [place]: [address] [place], [place] [address] [phone]  You do not need an appointment for this location.   Let us know if you need anything.

## 2023-04-11 NOTE — Addendum Note (Signed)
Addended by: Radene Gunning on: 04/11/2023 04:38 PM   Modules accepted: Level of Service

## 2023-04-11 NOTE — Progress Notes (Addendum)
Subjective:   Chief Complaint  Patient presents with   Follow-up    Follow up    Soren Fazzolari is a 77 y.o. male here for follow-up of diabetes.   Nestor does not routinely check his sugars.  Patient does require insulin.  Toujeo 70 u/d Medications include: metformin XR 1000 mg bid Diet is OK.  Exercise: none +SOB w exertion- going up stairs.  No CP.   Over the past mo, has been having worsening SOB w exertion. Sleeps in a reclined slightly elevated. No wheezing or new coughing.   Past Medical History:  Diagnosis Date   BPH (benign prostatic hyperplasia)    CTS (carpal tunnel syndrome)    Depression    Diabetes mellitus    Essential hypertension    GERD (gastroesophageal reflux disease)    History of breast cancer    Hypercholesteremia    Neuropathy      Related testing: Retinal exam: Done Pneumovax: done  Objective:  BP 126/80   Pulse 84   Temp 98 F (36.7 C) (Oral)   Resp 16   Ht 6\' 1"  (1.854 m)   Wt 237 lb 3.2 oz (107.6 kg)   SpO2 95%   BMI 31.29 kg/m  General:  Well developed, well nourished, in no apparent distress Mouth: MMM Lungs:  CTAB, no access msc use Cardio:  RRR, no bruits, 1+ pitting b/l LE edema tapering at mid tibia Psych: Age appropriate judgment and insight  Assessment:   Diabetes 1.5, managed as type 2 (HCC) - Plan: Comprehensive metabolic panel, Lipid panel, Microalbumin / creatinine urine ratio, Hemoglobin A1c  DOE (dyspnea on exertion) - Plan: CBC, EKG 12-Lead  Fatigue, unspecified type - Plan: TSH   Plan:   Chronic, probably not controlled. Has appt w endo in 3 mo. Will cont Toujeo 70 u qhs, metformin XR 1000 mg bid. GLP-1 was not affordable. Will make adjustments pending results. Counseled on diet and exercise. Labs as above. If neg, will ck CXR. If that is neg, will ck Echo. He is not having CP. If worsening, seek immediate care.  EKG shows a right axis deviation, normal sinus rhythm, widened QRS complex, no obvious signs of  ischemia with little change from last EKG.  Okay R wave progression. F/u pending above.  The patient voiced understanding and agreement to the plan.  Jilda Roche [place], DO 04/11/23 4:30 PM

## 2023-04-12 ENCOUNTER — Other Ambulatory Visit: Payer: Self-pay | Admitting: Family Medicine

## 2023-04-12 DIAGNOSIS — E1165 Type 2 diabetes mellitus with hyperglycemia: Secondary | ICD-10-CM

## 2023-04-12 LAB — CBC
HCT: 39.2 % (ref 39.0–52.0)
Hemoglobin: 12.6 g/dL — ABNORMAL LOW (ref 13.0–17.0)
MCHC: 32.2 g/dL (ref 30.0–36.0)
MCV: 86.4 fL (ref 78.0–100.0)
Platelets: 281 10*3/uL (ref 150.0–400.0)
RBC: 4.53 Mil/uL (ref 4.22–5.81)
RDW: 16 % — ABNORMAL HIGH (ref 11.5–15.5)
WBC: 7.3 10*3/uL (ref 4.0–10.5)

## 2023-04-12 LAB — COMPREHENSIVE METABOLIC PANEL
ALT: 23 U/L (ref 0–53)
AST: 22 U/L (ref 0–37)
Albumin: 4.2 g/dL (ref 3.5–5.2)
Alkaline Phosphatase: 46 U/L (ref 39–117)
BUN: 23 mg/dL (ref 6–23)
CO2: 27 meq/L (ref 19–32)
Calcium: 9.1 mg/dL (ref 8.4–10.5)
Chloride: 91 meq/L — ABNORMAL LOW (ref 96–112)
Creatinine, Ser: 1.64 mg/dL — ABNORMAL HIGH (ref 0.40–1.50)
GFR: 40.26 mL/min — ABNORMAL LOW (ref >60.00)
Glucose, Bld: 393 mg/dL — ABNORMAL HIGH (ref 70–99)
Potassium: 4.2 meq/L (ref 3.5–5.1)
Sodium: 136 meq/L (ref 135–145)
Total Bilirubin: 0.6 mg/dL (ref 0.2–1.2)
Total Protein: 6.4 g/dL (ref 6.0–8.3)

## 2023-04-12 LAB — LIPID PANEL
Cholesterol: 172 mg/dL (ref 0–200)
HDL: 27.7 mg/dL — ABNORMAL LOW (ref >39.00)
Total CHOL/HDL Ratio: 6
Triglycerides: 511 mg/dL — ABNORMAL HIGH (ref 0.0–149.0)

## 2023-04-12 LAB — MICROALBUMIN / CREATININE URINE RATIO
Creatinine,U: 62.5 mg/dL
Microalb Creat Ratio: 1.2 mg/g (ref 0.0–30.0)
Microalb, Ur: 0.7 mg/dL (ref 0.0–1.9)

## 2023-04-12 LAB — HEMOGLOBIN A1C: Hgb A1c MFr Bld: 12.4 % — ABNORMAL HIGH (ref 4.6–6.5)

## 2023-04-12 LAB — LDL CHOLESTEROL, DIRECT: Direct LDL: 78 mg/dL

## 2023-04-12 LAB — TSH: TSH: 2.02 u[IU]/mL (ref 0.35–5.50)

## 2023-04-12 MED ORDER — TOUJEO MAX SOLOSTAR 300 UNIT/ML ~~LOC~~ SOPN: PEN_INJECTOR | SUBCUTANEOUS | Status: DC

## 2023-04-16 ENCOUNTER — Inpatient Hospital Stay (HOSPITAL_BASED_OUTPATIENT_CLINIC_OR_DEPARTMENT_OTHER)
Admission: EM | Admit: 2023-04-16 | Discharge: 2023-04-22 | DRG: 064 | Disposition: A | Discharge status: Rehabilitation Facility | Payer: Medicare Other | Attending: Internal Medicine | Admitting: Internal Medicine

## 2023-04-16 ENCOUNTER — Encounter (HOSPITAL_BASED_OUTPATIENT_CLINIC_OR_DEPARTMENT_OTHER): Payer: Self-pay

## 2023-04-16 ENCOUNTER — Emergency Department (HOSPITAL_BASED_OUTPATIENT_CLINIC_OR_DEPARTMENT_OTHER): Payer: Medicare Other

## 2023-04-16 ENCOUNTER — Other Ambulatory Visit: Payer: Self-pay

## 2023-04-16 DIAGNOSIS — K219 Gastro-esophageal reflux disease without esophagitis: Secondary | ICD-10-CM | POA: Diagnosis not present

## 2023-04-16 DIAGNOSIS — Z7984 Long term (current) use of oral hypoglycemic drugs: Secondary | ICD-10-CM | POA: Diagnosis not present

## 2023-04-16 DIAGNOSIS — I63541 Cerebral infarction due to unspecified occlusion or stenosis of right cerebellar artery: Secondary | ICD-10-CM | POA: Diagnosis not present

## 2023-04-16 DIAGNOSIS — Z6830 Body mass index (BMI) 30.0-30.9, adult: Secondary | ICD-10-CM | POA: Diagnosis not present

## 2023-04-16 DIAGNOSIS — Z794 Long term (current) use of insulin: Secondary | ICD-10-CM

## 2023-04-16 DIAGNOSIS — E1122 Type 2 diabetes mellitus with diabetic chronic kidney disease: Secondary | ICD-10-CM | POA: Diagnosis present

## 2023-04-16 DIAGNOSIS — R112 Nausea with vomiting, unspecified: Secondary | ICD-10-CM

## 2023-04-16 DIAGNOSIS — E119 Type 2 diabetes mellitus without complications: Secondary | ICD-10-CM

## 2023-04-16 DIAGNOSIS — I6782 Cerebral ischemia: Secondary | ICD-10-CM | POA: Diagnosis not present

## 2023-04-16 DIAGNOSIS — E1165 Type 2 diabetes mellitus with hyperglycemia: Secondary | ICD-10-CM | POA: Diagnosis not present

## 2023-04-16 DIAGNOSIS — Z833 Family history of diabetes mellitus: Secondary | ICD-10-CM | POA: Diagnosis not present

## 2023-04-16 DIAGNOSIS — J069 Acute upper respiratory infection, unspecified: Secondary | ICD-10-CM | POA: Diagnosis not present

## 2023-04-16 DIAGNOSIS — Z853 Personal history of malignant neoplasm of breast: Secondary | ICD-10-CM | POA: Diagnosis not present

## 2023-04-16 DIAGNOSIS — H5509 Other forms of nystagmus: Secondary | ICD-10-CM | POA: Diagnosis present

## 2023-04-16 DIAGNOSIS — G6289 Other specified polyneuropathies: Secondary | ICD-10-CM | POA: Diagnosis not present

## 2023-04-16 DIAGNOSIS — L89301 Pressure ulcer of unspecified buttock, stage 1: Secondary | ICD-10-CM | POA: Diagnosis not present

## 2023-04-16 DIAGNOSIS — I10 Essential (primary) hypertension: Secondary | ICD-10-CM | POA: Diagnosis present

## 2023-04-16 DIAGNOSIS — I6621 Occlusion and stenosis of right posterior cerebral artery: Secondary | ICD-10-CM | POA: Diagnosis not present

## 2023-04-16 DIAGNOSIS — N1831 Chronic kidney disease, stage 3a: Secondary | ICD-10-CM | POA: Diagnosis not present

## 2023-04-16 DIAGNOSIS — Z79899 Other long term (current) drug therapy: Secondary | ICD-10-CM | POA: Diagnosis not present

## 2023-04-16 DIAGNOSIS — I63511 Cerebral infarction due to unspecified occlusion or stenosis of right middle cerebral artery: Secondary | ICD-10-CM | POA: Diagnosis not present

## 2023-04-16 DIAGNOSIS — Z91048 Other nonmedicinal substance allergy status: Secondary | ICD-10-CM

## 2023-04-16 DIAGNOSIS — N179 Acute kidney failure, unspecified: Secondary | ICD-10-CM | POA: Diagnosis not present

## 2023-04-16 DIAGNOSIS — E860 Dehydration: Secondary | ICD-10-CM | POA: Diagnosis not present

## 2023-04-16 DIAGNOSIS — Z9011 Acquired absence of right breast and nipple: Secondary | ICD-10-CM

## 2023-04-16 DIAGNOSIS — E785 Hyperlipidemia, unspecified: Secondary | ICD-10-CM | POA: Diagnosis present

## 2023-04-16 DIAGNOSIS — Z9841 Cataract extraction status, right eye: Secondary | ICD-10-CM

## 2023-04-16 DIAGNOSIS — I6523 Occlusion and stenosis of bilateral carotid arteries: Secondary | ICD-10-CM | POA: Diagnosis not present

## 2023-04-16 DIAGNOSIS — N189 Chronic kidney disease, unspecified: Secondary | ICD-10-CM | POA: Diagnosis not present

## 2023-04-16 DIAGNOSIS — E7800 Pure hypercholesterolemia, unspecified: Secondary | ICD-10-CM | POA: Diagnosis present

## 2023-04-16 DIAGNOSIS — K5900 Constipation, unspecified: Secondary | ICD-10-CM | POA: Diagnosis not present

## 2023-04-16 DIAGNOSIS — I129 Hypertensive chronic kidney disease with stage 1 through stage 4 chronic kidney disease, or unspecified chronic kidney disease: Secondary | ICD-10-CM | POA: Diagnosis not present

## 2023-04-16 DIAGNOSIS — I6389 Other cerebral infarction: Secondary | ICD-10-CM | POA: Diagnosis not present

## 2023-04-16 DIAGNOSIS — E1110 Type 2 diabetes mellitus with ketoacidosis without coma: Secondary | ICD-10-CM | POA: Diagnosis present

## 2023-04-16 DIAGNOSIS — Z9842 Cataract extraction status, left eye: Secondary | ICD-10-CM

## 2023-04-16 DIAGNOSIS — Z7902 Long term (current) use of antithrombotics/antiplatelets: Secondary | ICD-10-CM | POA: Diagnosis not present

## 2023-04-16 DIAGNOSIS — Z7982 Long term (current) use of aspirin: Secondary | ICD-10-CM

## 2023-04-16 DIAGNOSIS — I69398 Other sequelae of cerebral infarction: Secondary | ICD-10-CM | POA: Diagnosis not present

## 2023-04-16 DIAGNOSIS — N182 Chronic kidney disease, stage 2 (mild): Secondary | ICD-10-CM | POA: Diagnosis not present

## 2023-04-16 DIAGNOSIS — B9781 Human metapneumovirus as the cause of diseases classified elsewhere: Secondary | ICD-10-CM | POA: Diagnosis not present

## 2023-04-16 DIAGNOSIS — N401 Enlarged prostate with lower urinary tract symptoms: Secondary | ICD-10-CM | POA: Diagnosis not present

## 2023-04-16 DIAGNOSIS — R5383 Other fatigue: Secondary | ICD-10-CM | POA: Diagnosis not present

## 2023-04-16 DIAGNOSIS — R519 Headache, unspecified: Secondary | ICD-10-CM | POA: Diagnosis not present

## 2023-04-16 DIAGNOSIS — E669 Obesity, unspecified: Secondary | ICD-10-CM | POA: Diagnosis not present

## 2023-04-16 DIAGNOSIS — R42 Dizziness and giddiness: Secondary | ICD-10-CM

## 2023-04-16 DIAGNOSIS — R7989 Other specified abnormal findings of blood chemistry: Secondary | ICD-10-CM | POA: Diagnosis not present

## 2023-04-16 DIAGNOSIS — R278 Other lack of coordination: Secondary | ICD-10-CM | POA: Diagnosis present

## 2023-04-16 DIAGNOSIS — I639 Cerebral infarction, unspecified: Secondary | ICD-10-CM | POA: Diagnosis not present

## 2023-04-16 DIAGNOSIS — G629 Polyneuropathy, unspecified: Secondary | ICD-10-CM

## 2023-04-16 DIAGNOSIS — Z888 Allergy status to other drugs, medicaments and biological substances status: Secondary | ICD-10-CM

## 2023-04-16 DIAGNOSIS — K5901 Slow transit constipation: Secondary | ICD-10-CM | POA: Diagnosis not present

## 2023-04-16 DIAGNOSIS — R739 Hyperglycemia, unspecified: Principal | ICD-10-CM

## 2023-04-16 DIAGNOSIS — I6302 Cerebral infarction due to thrombosis of basilar artery: Secondary | ICD-10-CM | POA: Diagnosis not present

## 2023-04-16 DIAGNOSIS — Z9221 Personal history of antineoplastic chemotherapy: Secondary | ICD-10-CM | POA: Diagnosis not present

## 2023-04-16 DIAGNOSIS — E876 Hypokalemia: Secondary | ICD-10-CM | POA: Diagnosis not present

## 2023-04-16 DIAGNOSIS — E871 Hypo-osmolality and hyponatremia: Secondary | ICD-10-CM | POA: Diagnosis not present

## 2023-04-16 DIAGNOSIS — E1142 Type 2 diabetes mellitus with diabetic polyneuropathy: Secondary | ICD-10-CM | POA: Diagnosis present

## 2023-04-16 DIAGNOSIS — I959 Hypotension, unspecified: Secondary | ICD-10-CM | POA: Diagnosis not present

## 2023-04-16 DIAGNOSIS — Z79810 Long term (current) use of selective estrogen receptor modulators (SERMs): Secondary | ICD-10-CM | POA: Diagnosis not present

## 2023-04-16 DIAGNOSIS — N400 Enlarged prostate without lower urinary tract symptoms: Secondary | ICD-10-CM | POA: Diagnosis not present

## 2023-04-16 DIAGNOSIS — R29702 NIHSS score 2: Secondary | ICD-10-CM | POA: Diagnosis not present

## 2023-04-16 DIAGNOSIS — F54 Psychological and behavioral factors associated with disorders or diseases classified elsewhere: Secondary | ICD-10-CM | POA: Diagnosis not present

## 2023-04-16 DIAGNOSIS — F32A Depression, unspecified: Secondary | ICD-10-CM | POA: Diagnosis not present

## 2023-04-16 DIAGNOSIS — I6502 Occlusion and stenosis of left vertebral artery: Secondary | ICD-10-CM | POA: Diagnosis not present

## 2023-04-16 DIAGNOSIS — R052 Subacute cough: Secondary | ICD-10-CM | POA: Diagnosis not present

## 2023-04-16 DIAGNOSIS — E1169 Type 2 diabetes mellitus with other specified complication: Secondary | ICD-10-CM | POA: Diagnosis not present

## 2023-04-16 DIAGNOSIS — F419 Anxiety disorder, unspecified: Secondary | ICD-10-CM | POA: Diagnosis not present

## 2023-04-16 DIAGNOSIS — L89321 Pressure ulcer of left buttock, stage 1: Secondary | ICD-10-CM | POA: Diagnosis not present

## 2023-04-16 LAB — COMPREHENSIVE METABOLIC PANEL
ALT: 32 U/L (ref 0–44)
AST: 40 U/L (ref 15–41)
Albumin: 4.2 g/dL (ref 3.5–5.0)
Alkaline Phosphatase: 44 U/L (ref 38–126)
Anion gap: 19 — ABNORMAL HIGH (ref 5–15)
BUN: 31 mg/dL — ABNORMAL HIGH (ref 8–23)
CO2: 21 mmol/L — ABNORMAL LOW (ref 22–32)
Calcium: 9.2 mg/dL (ref 8.9–10.3)
Chloride: 94 mmol/L — ABNORMAL LOW (ref 98–111)
Creatinine, Ser: 1.34 mg/dL — ABNORMAL HIGH (ref 0.61–1.24)
GFR, Estimated: 55 mL/min — ABNORMAL LOW (ref >60)
Glucose, Bld: 407 mg/dL — ABNORMAL HIGH (ref 70–99)
Potassium: 4.6 mmol/L (ref 3.5–5.1)
Sodium: 134 mmol/L — ABNORMAL LOW (ref 135–145)
Total Bilirubin: 1.2 mg/dL (ref 0.0–1.2)
Total Protein: 7.4 g/dL (ref 6.5–8.1)

## 2023-04-16 LAB — I-STAT VENOUS BLOOD GAS, ED
Acid-base deficit: 1 mmol/L (ref 0.0–2.0)
Bicarbonate: 23.3 mmol/L (ref 20.0–28.0)
Calcium, Ion: 0.98 mmol/L — ABNORMAL LOW (ref 1.15–1.40)
HCT: 38 % — ABNORMAL LOW (ref 39.0–52.0)
Hemoglobin: 12.9 g/dL — ABNORMAL LOW (ref 13.0–17.0)
O2 Saturation: 82 %
Potassium: 5 mmol/L (ref 3.5–5.1)
Sodium: 132 mmol/L — ABNORMAL LOW (ref 135–145)
TCO2: 24 mmol/L (ref 22–32)
pCO2, Ven: 36.5 mm[Hg] — ABNORMAL LOW (ref 44–60)
pH, Ven: 7.414 (ref 7.25–7.43)
pO2, Ven: 45 mm[Hg] (ref 32–45)

## 2023-04-16 LAB — CBC
HCT: 39.4 % (ref 39.0–52.0)
Hemoglobin: 12.7 g/dL — ABNORMAL LOW (ref 13.0–17.0)
MCH: 26.8 pg (ref 26.0–34.0)
MCHC: 32.2 g/dL (ref 30.0–36.0)
MCV: 83.3 fL (ref 80.0–100.0)
Platelets: 311 10*3/uL (ref 150–400)
RBC: 4.73 MIL/uL (ref 4.22–5.81)
RDW: 15.1 % (ref 11.5–15.5)
WBC: 9.3 10*3/uL (ref 4.0–10.5)
nRBC: 0 % (ref 0.0–0.2)

## 2023-04-16 LAB — BASIC METABOLIC PANEL
Anion gap: 15 (ref 5–15)
Anion gap: 14 (ref 5–15)
BUN: 29 mg/dL — ABNORMAL HIGH (ref 8–23)
BUN: 25 mg/dL — ABNORMAL HIGH (ref 8–23)
CO2: 22 mmol/L (ref 22–32)
CO2: 22 mmol/L (ref 22–32)
Calcium: 8.7 mg/dL — ABNORMAL LOW (ref 8.9–10.3)
Calcium: 8.6 mg/dL — ABNORMAL LOW (ref 8.9–10.3)
Chloride: 98 mmol/L (ref 98–111)
Chloride: 98 mmol/L (ref 98–111)
Creatinine, Ser: 1.28 mg/dL — ABNORMAL HIGH (ref 0.61–1.24)
Creatinine, Ser: 1.14 mg/dL (ref 0.61–1.24)
GFR, Estimated: 58 mL/min — ABNORMAL LOW (ref >60)
GFR, Estimated: >60 mL/min (ref >60)
Glucose, Bld: 393 mg/dL — ABNORMAL HIGH (ref 70–99)
Glucose, Bld: 342 mg/dL — ABNORMAL HIGH (ref 70–99)
Potassium: 4.9 mmol/L (ref 3.5–5.1)
Potassium: 4.6 mmol/L (ref 3.5–5.1)
Sodium: 135 mmol/L (ref 135–145)
Sodium: 134 mmol/L — ABNORMAL LOW (ref 135–145)

## 2023-04-16 LAB — URINALYSIS, ROUTINE W REFLEX MICROSCOPIC
Bilirubin Urine: NEGATIVE
Glucose, UA: >=500 mg/dL — AB
Hgb urine dipstick: NEGATIVE
Ketones, ur: 40 mg/dL — AB
Leukocytes,Ua: NEGATIVE
Nitrite: NEGATIVE
Protein, ur: NEGATIVE mg/dL
Specific Gravity, Urine: 1.015 (ref 1.005–1.030)
pH: 5 (ref 5.0–8.0)

## 2023-04-16 LAB — URINALYSIS, MICROSCOPIC (REFLEX): WBC, UA: NONE SEEN WBC/hpf (ref 0–5)

## 2023-04-16 LAB — LIPASE, BLOOD: Lipase: 35 U/L (ref 11–51)

## 2023-04-16 LAB — BETA-HYDROXYBUTYRIC ACID: Beta-Hydroxybutyric Acid: 1.85 mmol/L — ABNORMAL HIGH (ref 0.05–0.27)

## 2023-04-16 LAB — CBG MONITORING, ED
Glucose-Capillary: 352 mg/dL — ABNORMAL HIGH (ref 70–99)
Glucose-Capillary: 374 mg/dL — ABNORMAL HIGH (ref 70–99)

## 2023-04-16 MED ORDER — LACTATED RINGERS IV BOLUS
20.0000 mL/kg | Freq: Once | INTRAVENOUS | Status: AC
  Administered 2023-04-16: 2000 mL via INTRAVENOUS

## 2023-04-16 MED ORDER — ONDANSETRON HCL 4 MG/2ML IJ SOLN: 4.0000 mg | Freq: Once | INTRAMUSCULAR | Status: DC

## 2023-04-16 MED ORDER — DIAZEPAM 5 MG/ML IJ SOLN
2.5000 mg | Freq: Once | INTRAMUSCULAR | Status: AC
  Administered 2023-04-16: 2.5 mg via INTRAVENOUS
  Filled 2023-04-16: qty 2

## 2023-04-16 MED ORDER — ONDANSETRON HCL 4 MG/2ML IJ SOLN
4.0000 mg | Freq: Once | INTRAMUSCULAR | Status: AC
  Administered 2023-04-16: 4 mg via INTRAVENOUS
  Filled 2023-04-16: qty 2

## 2023-04-16 MED ORDER — SODIUM CHLORIDE 0.9 % IV BOLUS
1000.0000 mL | Freq: Once | INTRAVENOUS | Status: AC
  Administered 2023-04-16: 1000 mL via INTRAVENOUS

## 2023-04-16 NOTE — ED Notes (Signed)
 [facility] called for transport.

## 2023-04-16 NOTE — ED Notes (Signed)
 Pt was incontinent of urine and stool; pt, bed cleaned and changed; adult brief placed on pt

## 2023-04-16 NOTE — ED Provider Notes (Signed)
 [facility] Provider Note   CSN: [id number] Arrival date & time: 04/16/23  1426     History  Chief Complaint  Patient presents with   Dizziness   Emesis    John [name] is a 78 y.o. male.  Patient here with dizziness, nausea.  Denies abdominal pain diarrhea.  History of diabetes, presumably vertigo, breast cancer, neuropathy, hypertension, high cholesterol.  Symptoms started about 11 hours ago.  He has not been able to tolerate meclizine  due to dry heaving and vomiting anytime she eats or drinks today.  However he denies any abdominal pain.  He has been following with ENT for vertigo.  He has had several episodes like this the last half year or so.  He typically has to use a cane to ambulate.  Too symptomatic to do that.  Denies chest pain shortness of breath weakness numbness tingling.  The history is provided by the patient.       Home Medications Prior to Admission medications   Medication Sig Start Date End Date Taking? Authorizing Provider  Accu-Chek Softclix Lancets lancets Use daily to check blood sugar.  DX E11.69 11/22/22   [name], DO  acetaminophen  (TYLENOL ) 650 MG CR tablet Take 1,300 mg by mouth every 8 (eight) hours as needed for pain.    [provider]  amLODipine  (NORVASC ) 5 MG tablet TAKE 1 TABLET(5 MG) BY MOUTH DAILY 02/15/23   Wendling, Mabel Mt, DO  Ascorbic Acid  (VITAMIN C ) 1000 MG tablet Take 1,000 mg by mouth in the morning and at bedtime.    [provider]  aspirin  325 MG EC tablet Take 325 mg by mouth daily.    [provider]  atorvastatin  (LIPITOR ) 40 MG tablet Take 1 tablet (40 mg total) by mouth daily. 03/08/22   [name], DO  Blood Glucose Monitoring Suppl (ACCU-CHEK GUIDE) w/Device KIT Use daily to check blood sugar.  DX E11.69 11/22/22   [name], DO  finasteride  (PROSCAR ) 5 MG tablet Take by mouth. 01/27/22 09/22/23  [provider]  furosemide  (LASIX ) 80 MG tablet Take 1 tablet (80 mg total) by mouth daily. 12/10/22   [name], DO  insulin  lispro (HUMALOG  KWIKPEN) 200 UNIT/ML KwikPen Inject 25 Units into the skin 3 (three) times daily before meals. 08/11/22   [name], DO  Lancets Misc. (ACCU-CHEK SOFTCLIX LANCET DEV) KIT Use daily to check blood sugar.  DX E11.69    [provider]  meclizine  (ANTIVERT ) 12.5 MG tablet Take 1 tablet (12.5 mg total) by mouth 3 (three) times daily as needed for dizziness. 11/08/22   Melvenia Motto, MD  metFORMIN  (GLUCOPHAGE -XR) 500 MG 24 hr tablet Take 2 tablets (1,000 mg total) by mouth 2 (two) times daily with a meal. 11/16/22   Wendling, Mabel Mt, DO  methocarbamol  (ROBAXIN ) 500 MG tablet TAKE 1 TABLET(500 MG) BY MOUTH EVERY 8 HOURS AS NEEDED FOR MUSCLE SPASMS 04/04/23   [name], DO  Multiple Vitamin (MULTIVITAMIN) tablet Take 1 tablet by mouth daily.    [provider]  nystatin  cream (MYCOSTATIN ) Apply 1 Application topically 2 (two) times daily. 04/11/23   [name], DO  omeprazole  (PRILOSEC) 40 MG capsule TAKE 1 CAPSULE(40 MG) BY MOUTH DAILY 07/12/22   Wendling, Mabel Mt, DO  tamoxifen  (NOLVADEX ) 20 MG tablet Take 20 mg by mouth daily.    [provider]  tamsulosin  (FLOMAX ) 0.4 MG CAPS capsule Take  1 capsule by mouth daily. 01/27/22 09/22/23  [provider]  TOUJEO  MAX SOLOSTAR 300 UNIT/ML Solostar Pen Take 15 u in the morning and 70 u in the evening. 04/12/23   [name], DO  zinc  sulfate 220 (50 Zn) MG capsule Take 1 capsule (220 mg total) by mouth daily. 10/29/19   Samtani, Jai-Gurmukh, MD  Zoster Vaccine Adjuvanted (SHINGRIX ) injection Inject into the muscle. 05/22/21   Luiz Channel, MD      Allergies    Ramipril, Other, Sertraline, and Adhesive [tape]    Review of Systems   Review of Systems  Physical Exam Updated Vital Signs BP (!) 152/72 (BP  Location: Right Arm)   Pulse 84   Temp (!) 97.5 F (36.4 C) (Oral)   Resp 20   Ht 6' 1 (1.854 m)   Wt 107.5 kg   SpO2 93%   BMI 31.27 kg/m  Physical Exam Vitals and nursing note reviewed.  Constitutional:      General: He is not in acute distress.    Appearance: He is well-developed. He is not ill-appearing.  HENT:     Head: Normocephalic and atraumatic.     Nose: Nose normal.     Mouth/Throat:     Mouth: Mucous membranes are moist.  Eyes:     Extraocular Movements: Extraocular movements intact.     Conjunctiva/sclera: Conjunctivae normal.     Pupils: Pupils are equal, round, and reactive to light.  Cardiovascular:     Rate and Rhythm: Normal rate and regular rhythm.     Pulses: Normal pulses.     Heart sounds: Normal heart sounds. No murmur heard. Pulmonary:     Effort: Pulmonary effort is normal. No respiratory distress.     Breath sounds: Normal breath sounds.  Abdominal:     Palpations: Abdomen is soft.     Tenderness: There is no abdominal tenderness.  Musculoskeletal:        General: No swelling. Normal range of motion.     Cervical back: Normal range of motion and neck supple.  Skin:    General: Skin is warm and dry.     Capillary Refill: Capillary refill takes less than 2 seconds.  Neurological:     General: No focal deficit present.     Mental Status: He is alert and oriented to person, place, and time.     Cranial Nerves: No cranial nerve deficit.     Sensory: No sensory deficit.     Motor: No weakness.     Coordination: Coordination normal.     Comments: No obvious nystagmus, 5+ out of 5 strength all, normal sensation, no drift, normal speech, normal visual fields  Psychiatric:        Mood and Affect: Mood normal.     ED Results / Procedures / Treatments   Labs (all labs ordered are listed, but only abnormal results are displayed) Labs Reviewed  COMPREHENSIVE METABOLIC PANEL - Abnormal; Notable for the following components:      Result Value    Sodium 134 (*)    Chloride 94 (*)    CO2 21 (*)    Glucose, Bld 407 (*)    BUN 31 (*)    Creatinine, Ser 1.34 (*)    GFR, Estimated 55 (*)    Anion gap 19 (*)    All other components within normal limits  CBC - Abnormal; Notable for the following components:   Hemoglobin 12.7 (*)    All other components within  normal limits  CBG MONITORING, ED - Abnormal; Notable for the following components:   Glucose-Capillary 352 (*)    All other components within normal limits  I-STAT VENOUS BLOOD GAS, ED - Abnormal; Notable for the following components:   pCO2, Ven 36.5 (*)    Sodium 132 (*)    Calcium , Ion 0.98 (*)    HCT 38.0 (*)    Hemoglobin 12.9 (*)    All other components within normal limits  LIPASE, BLOOD  URINALYSIS, ROUTINE W REFLEX MICROSCOPIC  BLOOD GAS, VENOUS  BASIC METABOLIC PANEL  BASIC METABOLIC PANEL  BASIC METABOLIC PANEL  BASIC METABOLIC PANEL  BETA-HYDROXYBUTYRIC ACID  BETA-HYDROXYBUTYRIC ACID  CBG MONITORING, ED    EKG EKG Interpretation Date/Time:  Saturday April 16 2023 14:38:05 EST Ventricular Rate:  82 PR Interval:  203 QRS Duration:  143 QT Interval:  414 QTC Calculation: 484 R Axis:   105  Text Interpretation: Sinus arrhythmia RBBB and LPFB Probable anteroseptal infarct, old Confirmed by Dreama Longs ([id number]) on 04/16/2023 2:57:20 PM  Radiology CT Head Wo Contrast Result Date: 04/16/2023 CLINICAL DATA:  Headache, increasing frequency or severity. Dizziness and vomiting. EXAM: CT HEAD WITHOUT CONTRAST TECHNIQUE: Contiguous axial images were obtained from the base of the skull through the vertex without intravenous contrast. RADIATION DOSE REDUCTION: This exam was performed according to the departmental dose-optimization program which includes automated exposure control, adjustment of the mA and/or kV according to patient size and/or use of iterative reconstruction technique. COMPARISON:  Head MRI 11/08/2022 FINDINGS: Brain: There is no evidence of an  acute infarct, intracranial hemorrhage, mass, midline shift, or extra-axial fluid collection. Patchy hypodensities in the cerebral white matter are nonspecific but compatible with moderate chronic small vessel ischemic disease. 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 Mild cerebral atrophy is within normal limits for age. Vascular: Calcified atherosclerosis at the skull base. No hyperdense vessel. Skull: No acute fracture or suspicious osseous lesion. Sinuses/Orbits: Paranasal sinuses and mastoid air cells are clear. Bilateral cataract extraction. Other: None. IMPRESSION: 1. No evidence of acute intracranial abnormality. 2. Moderate chronic small vessel ischemic disease. Electronically Signed   By: Dasie Hamburg M.D.   On: 04/16/2023 16:45    Procedures .Critical Care  Performed by: Ruthe Cornet, DO Authorized by: Ruthe Cornet, DO   Critical care provider statement:    Critical care time (minutes):  35   Critical care was necessary to treat or prevent imminent or life-threatening deterioration of the following conditions:  Metabolic crisis   Critical care was time spent personally by me on the following activities:  Blood draw for specimens, development of treatment plan with patient or surrogate, discussions with primary provider, evaluation of patient's response to treatment, examination of patient, obtaining history from patient or surrogate, ordering and performing treatments and interventions, ordering and review of laboratory studies, ordering and review of radiographic studies, pulse oximetry, re-evaluation of patient's condition and review of old charts   I assumed direction of critical care for this patient from another provider in my specialty: no       Medications Ordered in ED Medications  ondansetron  (ZOFRAN ) injection 4 mg (has no administration in time range)  lactated ringers  bolus 2,150 mL (has no administration in time range)  sodium chloride   0.9 % bolus 1,000 mL ( Intravenous Stopped 04/16/23 1706)  diazepam  (VALIUM ) injection 2.5 mg (2.5 mg Intravenous Given 04/16/23 1542)    ED Course/ Medical Decision Making/ A&P  Medical Decision Making Amount and/or Complexity of Data Reviewed Labs: ordered. Radiology: ordered.  Risk Prescription drug management. Decision regarding hospitalization.   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  History of diabetes high cholesterol hypertension breast cancer vertigo.  Symptoms started about 12 hours ago.  Worse with head movement.  Better with rest.  Has not been able to tolerate his meclizine  p.o.  He follows with ENT as he has been dealing with the symptoms here recently.  An MRI about 6 months ago that was unremarkable when these started the first happen.  He denies any abdominal pain.  Overall his neuroexam appears to be normal.  He has no obvious nystagmus.  He still feels nauseous but dizziness is improved while he is lying still.  He uses a cane to ambulate.  He has not been able to tolerate what sounds like vestibular therapy or Epley maneuvers in the past.  He has had follow-up with ENT for this.  Overall we will give him a dose of Valium  check basic labs get a head CT give fluids and evaluate.  He feels dehydrated he has not been able to keep anything down today.  Does have diabetes and could be diabetes related.  Not having any abdominal tenderness and this does not appear to be a bowel obstruction or anything intra-abdominal at this time.  Does not seem like he has history of gastroparesis but possible as well.  Patient overall with elevated blood sugar to 4 7 with anion gap at 19 however bicarb 21 pH is 7.414.  Lab work otherwise unremarkable.  Head CT is unremarkable.  Still really dizzy, unable to tolerate p.o.  I do think that this is a peripheral vertigo but not sure if there is some sort of gastroparesis/diabetic process as well going on.  I  talked with the hospitalist for admission for symptomatic care and overall we will start the patient on DKA protocol insulin  given inability to tolerate p.o.  He has been given fluid bolus.  They will continue treatment and may be consider MRI but I think this is less likely a stroke.  Patient admitted to medicine for further care.  Started on insulin  infusion.  This chart was dictated using voice recognition software.  Despite best efforts to proofread,  errors can occur which can change the documentation meaning.         Final Clinical Impression(s) / ED Diagnoses Final diagnoses:  Hyperglycemia  Nausea and vomiting, unspecified vomiting type  Dizziness    Rx / DC Orders ED Discharge Orders     None         Ruthe Cornet, DO 04/16/23 1756

## 2023-04-16 NOTE — ED Notes (Signed)
 RT Note: VBG completed and handed to Dr. Lockie Mola

## 2023-04-16 NOTE — ED Notes (Signed)
 Please call Wife at [phone] when pt leaves [facility] for Admit

## 2023-04-16 NOTE — ED Triage Notes (Signed)
 The patient has dizziness, vomiting since 4 am.

## 2023-04-16 NOTE — ED Notes (Signed)
 ED Provider at bedside.

## 2023-04-17 ENCOUNTER — Observation Stay (HOSPITAL_COMMUNITY): Payer: Medicare Other

## 2023-04-17 ENCOUNTER — Encounter (HOSPITAL_COMMUNITY): Payer: Self-pay | Admitting: Internal Medicine

## 2023-04-17 DIAGNOSIS — E1142 Type 2 diabetes mellitus with diabetic polyneuropathy: Secondary | ICD-10-CM | POA: Diagnosis not present

## 2023-04-17 DIAGNOSIS — E860 Dehydration: Secondary | ICD-10-CM | POA: Diagnosis not present

## 2023-04-17 DIAGNOSIS — I10 Essential (primary) hypertension: Secondary | ICD-10-CM

## 2023-04-17 DIAGNOSIS — E1110 Type 2 diabetes mellitus with ketoacidosis without coma: Secondary | ICD-10-CM | POA: Diagnosis not present

## 2023-04-17 DIAGNOSIS — Z7984 Long term (current) use of oral hypoglycemic drugs: Secondary | ICD-10-CM | POA: Diagnosis not present

## 2023-04-17 DIAGNOSIS — I6389 Other cerebral infarction: Secondary | ICD-10-CM | POA: Diagnosis not present

## 2023-04-17 DIAGNOSIS — R42 Dizziness and giddiness: Secondary | ICD-10-CM

## 2023-04-17 DIAGNOSIS — G609 Hereditary and idiopathic neuropathy, unspecified: Secondary | ICD-10-CM

## 2023-04-17 DIAGNOSIS — Z853 Personal history of malignant neoplasm of breast: Secondary | ICD-10-CM | POA: Diagnosis not present

## 2023-04-17 DIAGNOSIS — E876 Hypokalemia: Secondary | ICD-10-CM | POA: Diagnosis not present

## 2023-04-17 DIAGNOSIS — N179 Acute kidney failure, unspecified: Secondary | ICD-10-CM | POA: Diagnosis not present

## 2023-04-17 DIAGNOSIS — R278 Other lack of coordination: Secondary | ICD-10-CM | POA: Diagnosis not present

## 2023-04-17 DIAGNOSIS — N1831 Chronic kidney disease, stage 3a: Secondary | ICD-10-CM | POA: Diagnosis not present

## 2023-04-17 DIAGNOSIS — E1122 Type 2 diabetes mellitus with diabetic chronic kidney disease: Secondary | ICD-10-CM | POA: Diagnosis not present

## 2023-04-17 DIAGNOSIS — N400 Enlarged prostate without lower urinary tract symptoms: Secondary | ICD-10-CM

## 2023-04-17 DIAGNOSIS — E7849 Other hyperlipidemia: Secondary | ICD-10-CM

## 2023-04-17 DIAGNOSIS — E119 Type 2 diabetes mellitus without complications: Secondary | ICD-10-CM

## 2023-04-17 DIAGNOSIS — Z794 Long term (current) use of insulin: Secondary | ICD-10-CM

## 2023-04-17 DIAGNOSIS — E871 Hypo-osmolality and hyponatremia: Secondary | ICD-10-CM | POA: Diagnosis not present

## 2023-04-17 DIAGNOSIS — Z79899 Other long term (current) drug therapy: Secondary | ICD-10-CM | POA: Diagnosis not present

## 2023-04-17 DIAGNOSIS — Z79810 Long term (current) use of selective estrogen receptor modulators (SERMs): Secondary | ICD-10-CM | POA: Diagnosis not present

## 2023-04-17 DIAGNOSIS — Z9221 Personal history of antineoplastic chemotherapy: Secondary | ICD-10-CM | POA: Diagnosis not present

## 2023-04-17 DIAGNOSIS — R29702 NIHSS score 2: Secondary | ICD-10-CM | POA: Diagnosis not present

## 2023-04-17 DIAGNOSIS — Z833 Family history of diabetes mellitus: Secondary | ICD-10-CM | POA: Diagnosis not present

## 2023-04-17 DIAGNOSIS — E7800 Pure hypercholesterolemia, unspecified: Secondary | ICD-10-CM | POA: Diagnosis not present

## 2023-04-17 DIAGNOSIS — I129 Hypertensive chronic kidney disease with stage 1 through stage 4 chronic kidney disease, or unspecified chronic kidney disease: Secondary | ICD-10-CM | POA: Diagnosis not present

## 2023-04-17 DIAGNOSIS — H5509 Other forms of nystagmus: Secondary | ICD-10-CM | POA: Diagnosis not present

## 2023-04-17 DIAGNOSIS — N189 Chronic kidney disease, unspecified: Secondary | ICD-10-CM

## 2023-04-17 DIAGNOSIS — K219 Gastro-esophageal reflux disease without esophagitis: Secondary | ICD-10-CM | POA: Diagnosis not present

## 2023-04-17 DIAGNOSIS — Z91048 Other nonmedicinal substance allergy status: Secondary | ICD-10-CM | POA: Diagnosis not present

## 2023-04-17 LAB — CBC
HCT: 35.9 % — ABNORMAL LOW (ref 39.0–52.0)
Hemoglobin: 11.7 g/dL — ABNORMAL LOW (ref 13.0–17.0)
MCH: 27.1 pg (ref 26.0–34.0)
MCHC: 32.6 g/dL (ref 30.0–36.0)
MCV: 83.1 fL (ref 80.0–100.0)
Platelets: 285 10*3/uL (ref 150–400)
RBC: 4.32 MIL/uL (ref 4.22–5.81)
RDW: 15.1 % (ref 11.5–15.5)
WBC: 11.6 10*3/uL — ABNORMAL HIGH (ref 4.0–10.5)
nRBC: 0 % (ref 0.0–0.2)

## 2023-04-17 LAB — COMPREHENSIVE METABOLIC PANEL WITH GFR
ALT: 25 U/L (ref 0–44)
AST: 23 U/L (ref 15–41)
Albumin: 3.2 g/dL — ABNORMAL LOW (ref 3.5–5.0)
Alkaline Phosphatase: 35 U/L — ABNORMAL LOW (ref 38–126)
Anion gap: 13 (ref 5–15)
BUN: 20 mg/dL (ref 8–23)
CO2: 25 mmol/L (ref 22–32)
Calcium: 9 mg/dL (ref 8.9–10.3)
Chloride: 102 mmol/L (ref 98–111)
Creatinine, Ser: 1.08 mg/dL (ref 0.61–1.24)
GFR, Estimated: >60 mL/min
Glucose, Bld: 271 mg/dL — ABNORMAL HIGH (ref 70–99)
Potassium: 4 mmol/L (ref 3.5–5.1)
Sodium: 140 mmol/L (ref 135–145)
Total Bilirubin: 1 mg/dL (ref 0.0–1.2)
Total Protein: 5.7 g/dL — ABNORMAL LOW (ref 6.5–8.1)

## 2023-04-17 LAB — BASIC METABOLIC PANEL
Anion gap: 15 (ref 5–15)
BUN: 22 mg/dL (ref 8–23)
CO2: 22 mmol/L (ref 22–32)
Calcium: 8.7 mg/dL — ABNORMAL LOW (ref 8.9–10.3)
Chloride: 100 mmol/L (ref 98–111)
Creatinine, Ser: 1.2 mg/dL (ref 0.61–1.24)
GFR, Estimated: >60 mL/min (ref >60)
Glucose, Bld: 310 mg/dL — ABNORMAL HIGH (ref 70–99)
Potassium: 4.5 mmol/L (ref 3.5–5.1)
Sodium: 137 mmol/L (ref 135–145)

## 2023-04-17 LAB — GLUCOSE, CAPILLARY
Glucose-Capillary: 160 mg/dL — ABNORMAL HIGH (ref 70–99)
Glucose-Capillary: 201 mg/dL — ABNORMAL HIGH (ref 70–99)
Glucose-Capillary: 216 mg/dL — ABNORMAL HIGH (ref 70–99)
Glucose-Capillary: 234 mg/dL — ABNORMAL HIGH (ref 70–99)
Glucose-Capillary: 283 mg/dL — ABNORMAL HIGH (ref 70–99)
Glucose-Capillary: 337 mg/dL — ABNORMAL HIGH (ref 70–99)

## 2023-04-17 MED ORDER — HYDRALAZINE HCL 20 MG/ML IJ SOLN
10.0000 mg | Freq: Four times a day (QID) | INTRAMUSCULAR | Status: DC | PRN
  Administered 2023-04-18: 10 mg via INTRAVENOUS
  Filled 2023-04-17: qty 1

## 2023-04-17 MED ORDER — INSULIN GLARGINE-YFGN 100 UNIT/ML ~~LOC~~ SOLN
25.0000 [IU] | Freq: Every day | SUBCUTANEOUS | Status: DC
  Administered 2023-04-18: 25 [IU] via SUBCUTANEOUS
  Filled 2023-04-17: qty 0.25

## 2023-04-17 MED ORDER — INSULIN GLARGINE-YFGN 100 UNIT/ML ~~LOC~~ SOLN: 15.0000 [IU] | Freq: Every day | SUBCUTANEOUS | Status: DC

## 2023-04-17 MED ORDER — INSULIN ASPART 100 UNIT/ML IJ SOLN: 0.0000 [IU] | Freq: Every day | INTRAMUSCULAR | Status: DC

## 2023-04-17 MED ORDER — FINASTERIDE 5 MG PO TABS
5.0000 mg | ORAL_TABLET | Freq: Every day | ORAL | Status: DC
  Administered 2023-04-17 – 2023-04-22 (×6): 5 mg via ORAL
  Filled 2023-04-17 (×6): qty 1

## 2023-04-17 MED ORDER — ACETAMINOPHEN 325 MG PO TABS
650.0000 mg | ORAL_TABLET | Freq: Four times a day (QID) | ORAL | Status: DC | PRN
  Administered 2023-04-18: 650 mg via ORAL
  Filled 2023-04-17: qty 2

## 2023-04-17 MED ORDER — INSULIN ASPART 100 UNIT/ML IJ SOLN: 0.0000 [IU] | Freq: Three times a day (TID) | INTRAMUSCULAR | Status: DC

## 2023-04-17 MED ORDER — SODIUM CHLORIDE 0.9% FLUSH: 3.0000 mL | INTRAVENOUS | Status: DC | PRN

## 2023-04-17 MED ORDER — TAMSULOSIN HCL 0.4 MG PO CAPS
0.4000 mg | ORAL_CAPSULE | Freq: Every day | ORAL | Status: DC
  Administered 2023-04-17 – 2023-04-22 (×6): 0.4 mg via ORAL
  Filled 2023-04-17 (×6): qty 1

## 2023-04-17 MED ORDER — MECLIZINE HCL 12.5 MG PO TABS
12.5000 mg | ORAL_TABLET | Freq: Three times a day (TID) | ORAL | Status: DC | PRN
  Administered 2023-04-17 – 2023-04-19 (×6): 12.5 mg via ORAL
  Filled 2023-04-17 (×9): qty 1

## 2023-04-17 MED ORDER — ONDANSETRON HCL 4 MG/2ML IJ SOLN: 4.0000 mg | Freq: Four times a day (QID) | INTRAMUSCULAR | Status: DC | PRN

## 2023-04-17 MED ORDER — SODIUM CHLORIDE 0.9% FLUSH
3.0000 mL | Freq: Two times a day (BID) | INTRAVENOUS | Status: DC
  Administered 2023-04-17 – 2023-04-22 (×12): 3 mL via INTRAVENOUS

## 2023-04-17 MED ORDER — ACETAMINOPHEN 650 MG RE SUPP
650.0000 mg | Freq: Four times a day (QID) | RECTAL | Status: DC | PRN
Start: 2023-04-17 — End: 2023-04-22

## 2023-04-17 MED ORDER — AMLODIPINE BESYLATE 5 MG PO TABS
5.0000 mg | ORAL_TABLET | Freq: Every day | ORAL | Status: DC
Start: 2023-04-17 — End: 2023-04-22
  Administered 2023-04-17 – 2023-04-22 (×6): 5 mg via ORAL
  Filled 2023-04-17 (×6): qty 1

## 2023-04-17 MED ORDER — ONDANSETRON HCL 4 MG/2ML IJ SOLN
INTRAMUSCULAR | Status: AC
  Administered 2023-04-17: 4 mg via INTRAVENOUS
  Filled 2023-04-17: qty 2

## 2023-04-17 MED ORDER — PANTOPRAZOLE SODIUM 40 MG PO TBEC
40.0000 mg | DELAYED_RELEASE_TABLET | Freq: Every day | ORAL | Status: DC
  Administered 2023-04-17 – 2023-04-22 (×6): 40 mg via ORAL
  Filled 2023-04-17 (×6): qty 1

## 2023-04-17 MED ORDER — ONDANSETRON HCL 4 MG/2ML IJ SOLN: 4.0000 mg | Freq: Once | INTRAMUSCULAR | Status: AC

## 2023-04-17 MED ORDER — INSULIN ASPART 100 UNIT/ML IJ SOLN
0.0000 [IU] | INTRAMUSCULAR | Status: DC
  Administered 2023-04-17: 8 [IU] via SUBCUTANEOUS
  Administered 2023-04-17 (×2): 5 [IU] via SUBCUTANEOUS
  Administered 2023-04-17: 11 [IU] via SUBCUTANEOUS
  Administered 2023-04-17: 5 [IU] via SUBCUTANEOUS
  Administered 2023-04-18: 8 [IU] via SUBCUTANEOUS
  Administered 2023-04-18 (×2): 3 [IU] via SUBCUTANEOUS
  Administered 2023-04-18: 5 [IU] via SUBCUTANEOUS
  Administered 2023-04-18 (×2): 3 [IU] via SUBCUTANEOUS
  Administered 2023-04-19: 2 [IU] via SUBCUTANEOUS
  Administered 2023-04-19: 5 [IU] via SUBCUTANEOUS
  Administered 2023-04-19: 3 [IU] via SUBCUTANEOUS

## 2023-04-17 MED ORDER — ATORVASTATIN CALCIUM 40 MG PO TABS
40.0000 mg | ORAL_TABLET | Freq: Every day | ORAL | Status: DC
  Administered 2023-04-17 – 2023-04-19 (×3): 40 mg via ORAL
  Filled 2023-04-17 (×3): qty 1

## 2023-04-17 MED ORDER — INSULIN GLARGINE-YFGN 100 UNIT/ML ~~LOC~~ SOLN
20.0000 [IU] | Freq: Every day | SUBCUTANEOUS | Status: DC
  Administered 2023-04-17: 20 [IU] via SUBCUTANEOUS
  Filled 2023-04-17: qty 0.2

## 2023-04-17 MED ORDER — SODIUM CHLORIDE 0.9 % IV SOLN
250.0000 mL | INTRAVENOUS | Status: AC | PRN
Start: 2023-04-17 — End: 2023-04-18

## 2023-04-17 MED ORDER — INSULIN ASPART 100 UNIT/ML IJ SOLN
0.0000 [IU] | INTRAMUSCULAR | Status: DC | PRN
Start: 2023-04-17 — End: 2023-04-17

## 2023-04-17 MED ORDER — ASPIRIN 325 MG PO TBEC
325.0000 mg | DELAYED_RELEASE_TABLET | Freq: Every day | ORAL | Status: DC
  Administered 2023-04-17 – 2023-04-19 (×3): 325 mg via ORAL
  Filled 2023-04-17 (×3): qty 1

## 2023-04-17 MED ORDER — INSULIN LISPRO 200 UNIT/ML ~~LOC~~ SOPN: 25.0000 [IU] | PEN_INJECTOR | Freq: Three times a day (TID) | SUBCUTANEOUS | Status: DC

## 2023-04-17 NOTE — Plan of Care (Signed)
  Problem: Education: Goal: Knowledge of General Education information will improve Description: Including pain rating scale, medication(s)/side effects and non-pharmacologic comfort measures Outcome: Progressing   Problem: Skin Integrity: Goal: Risk for impaired skin integrity will decrease Outcome: Progressing   Problem: Health Behavior/Discharge Planning: Goal: Ability to manage health-related needs will improve Outcome: Not Progressing   Problem: Activity: Goal: Risk for activity intolerance will decrease Outcome: Not Progressing   Problem: Coping: Goal: Level of anxiety will decrease Outcome: Not Progressing   Problem: Health Behavior/Discharge Planning: Goal: Ability to manage health-related needs will improve Outcome: Not Progressing   Problem: Metabolic: Goal: Ability to maintain appropriate glucose levels will improve Outcome: Not Progressing   Problem: Nutritional: Goal: Progress toward achieving an optimal weight will improve Outcome: Not Progressing

## 2023-04-17 NOTE — H&P (Signed)
 History and Physical    John [name] FMW:[id number] DOB: [birth year] DOA: 04/16/2023  PCP: [name], DO   Patient coming from: Home   Chief Complaint:  Chief Complaint  Patient presents with   Dizziness   Emesis   ED TRIAGE note:  HPI:  John [name] is a 78 y.o. male with medical history significant of chronic vertigo, insulin -dependent DM type II, essential hypertension, hyperlipidemia, peripheral neuropathy and breast cancer presented to emergency department complaining of dizziness and nausea for last 10 to 12 hours.  Patient reported that he was not able to tolerate meclizine  due to nausea and vomiting and unable to tolerate any oral food   Patient has been following ENT for vertigo and at the baseline patient is usually able to ambulate but due to worsening dizziness limiting ambulation.  Patient denies any chest pain, shortness of breath, palpitation, diaphoresis, change of weight or appetite.  At presentation to ED patient found persistently elevated blood pressure otherwise hemodynamically stable. CBC showed elevated blood glucose around 400. CMP showed low sodium 134, low chloride 94, low bicarb 21, elevated blood glucose 407, elevated anion gap 19, BUN 31, creatinine 1.34 GFR 55 which is around baseline. pH should be 7.4, pCO2 36, pO2 45, bicarb 23 and normal anion gap. Slight elevated beta-hydroxybutyrate level 1.85.  With the concern for early development of DKA in the ED patient received 2.1 L of LR bolus and 1 L of NS bolus afterward blood glucose level has been improved, bicarb and anion gap has been normalized.  Patient never been started on insulin  drip while in the ED.  For the management of dizziness patient received Zofran  and Valium .  Due to persistent dizziness CT head obtained which showed moderate chronic small ischemic disease.    Patient has been transferred to [facility] for further management of chronic dizziness, hyperglycemic  crisis/early development of DKA and acute kidney injury.    Significant labs in the ED: Lab Orders         Lipase, blood         Comprehensive metabolic panel         CBC         Urinalysis, Routine w reflex microscopic -Urine, Clean Catch         Urinalysis, Microscopic (reflex)         Glucose, capillary         Comprehensive metabolic panel         CBC         Basic metabolic panel         CBG monitoring, ED         I-Stat venous blood gas, ED         CBG monitoring, ED         CBG monitoring, ED       Review of Systems:  Review of Systems  Constitutional:  Negative for chills, fever, malaise/fatigue and weight loss.  Respiratory:  Negative for cough and shortness of breath.   Cardiovascular:  Negative for chest pain and palpitations.  Gastrointestinal:  Negative for abdominal pain, diarrhea, heartburn, nausea and vomiting.  Neurological:  Positive for dizziness. Negative for sensory change, speech change, focal weakness, weakness and headaches.  Psychiatric/Behavioral:  The patient is not nervous/anxious.     Past Medical History:  Diagnosis Date   BPH (benign prostatic hyperplasia)    CTS (carpal tunnel syndrome)    Depression    Diabetes mellitus  Essential hypertension    GERD (gastroesophageal reflux disease)    History of breast cancer    Hypercholesteremia    Neuropathy     Past Surgical History:  Procedure Laterality Date   CATARACT EXTRACTION, BILATERAL  01/18/2017   LITHOTRIPSY     MASTECTOMY Right 05/02/2018   TONSILLECTOMY AND ADENOIDECTOMY       reports that he has never smoked. He has never used smokeless tobacco. He reports that he does not drink alcohol and does not use drugs.  Allergies  Allergen Reactions   Ramipril Anaphylaxis   Other Diarrhea    Severe intolerance to Chemotherapy in the past.   Sertraline Other (See Comments)    Extreme headaches   Adhesive [Tape] Rash    Family History  Problem Relation Age of Onset    Lymphoma Mother    Cancer Mother    Diabetes Father    Stroke Father    Ovarian cancer Sister    Cancer Sister    Cancer Paternal Grandmother     Prior to Admission medications   Medication Sig Start Date End Date Taking? Authorizing Provider  Accu-Chek Softclix Lancets lancets Use daily to check blood sugar.  DX E11.69 11/22/22   [name], DO  acetaminophen  (TYLENOL ) 650 MG CR tablet Take 1,300 mg by mouth every 8 (eight) hours as needed for pain.    [provider]  amLODipine  (NORVASC ) 5 MG tablet TAKE 1 TABLET(5 MG) BY MOUTH DAILY 02/15/23   Wendling, Mabel Mt, DO  Ascorbic Acid  (VITAMIN C ) 1000 MG tablet Take 1,000 mg by mouth in the morning and at bedtime.    [provider]  aspirin  325 MG EC tablet Take 325 mg by mouth daily.    [provider]  atorvastatin  (LIPITOR ) 40 MG tablet Take 1 tablet (40 mg total) by mouth daily. 03/08/22   [name], DO  Blood Glucose Monitoring Suppl (ACCU-CHEK GUIDE) w/Device KIT Use daily to check blood sugar.  DX E11.69 11/22/22   [name], DO  finasteride  (PROSCAR ) 5 MG tablet Take by mouth. 01/27/22 09/22/23  [provider]  furosemide  (LASIX ) 80 MG tablet Take 1 tablet (80 mg total) by mouth daily. 12/10/22   [name], DO  insulin  lispro (HUMALOG  KWIKPEN) 200 UNIT/ML KwikPen Inject 25 Units into the skin 3 (three) times daily before meals. 08/11/22   [name], DO  Lancets Misc. (ACCU-CHEK SOFTCLIX LANCET DEV) KIT Use daily to check blood sugar.  DX E11.69    [provider]  meclizine  (ANTIVERT ) 12.5 MG tablet Take 1 tablet (12.5 mg total) by mouth 3 (three) times daily as needed for dizziness. 11/08/22   Melvenia Motto, MD  metFORMIN  (GLUCOPHAGE -XR) 500 MG 24 hr tablet Take 2 tablets (1,000 mg total) by mouth 2 (two) times daily with a meal. 11/16/22   Wendling, Mabel Mt, DO  methocarbamol  (ROBAXIN ) 500 MG tablet TAKE 1 TABLET(500  MG) BY MOUTH EVERY 8 HOURS AS NEEDED FOR MUSCLE SPASMS 04/04/23   [name], DO  Multiple Vitamin (MULTIVITAMIN) tablet Take 1 tablet by mouth daily.    [provider]  nystatin  cream (MYCOSTATIN ) Apply 1 Application topically 2 (two) times daily. 04/11/23   [name], DO  omeprazole  (PRILOSEC) 40 MG capsule TAKE 1 CAPSULE(40 MG) BY MOUTH DAILY 07/12/22   [name], DO  tamoxifen  (NOLVADEX ) 20 MG tablet Take 20 mg by mouth daily.    [provider]  tamsulosin  (  FLOMAX ) 0.4 MG CAPS capsule Take 1 capsule by mouth daily. 01/27/22 09/22/23  [provider]  TOUJEO  MAX SOLOSTAR 300 UNIT/ML Solostar Pen Take 15 u in the morning and 70 u in the evening. 04/12/23   [name], DO  zinc  sulfate 220 (50 Zn) MG capsule Take 1 capsule (220 mg total) by mouth daily. 10/29/19   Samtani, Jai-Gurmukh, MD  Zoster Vaccine Adjuvanted (SHINGRIX ) injection Inject into the muscle. 05/22/21   Luiz Channel, MD     Physical Exam: Vitals:   04/17/23 0056 04/17/23 0059 04/17/23 0200 04/17/23 0210  BP: (!) 161/71  (!) 177/74 (!) 148/72  Pulse:  92 82   Resp: (!) 23 (!) 26 20   Temp:   98.4 F (36.9 C)   TempSrc:      SpO2:  91% 96%   Weight:      Height:        Physical Exam Constitutional:      General: He is not in acute distress.    Appearance: He is obese. He is not ill-appearing.  HENT:     Mouth/Throat:     Mouth: Mucous membranes are moist.  Eyes:     Conjunctiva/sclera: Conjunctivae normal.  Cardiovascular:     Rate and Rhythm: Normal rate and regular rhythm.     Pulses: Normal pulses.     Heart sounds: Normal heart sounds.  Pulmonary:     Effort: Pulmonary effort is normal.     Breath sounds: Normal breath sounds.  Abdominal:     General: Bowel sounds are normal.  Musculoskeletal:     Cervical back: Neck supple.  Skin:    Capillary Refill: Capillary refill takes less than 2 seconds.  Neurological:      Mental Status: He is oriented to person, place, and time.     Comments: Sensorineural deafness  Psychiatric:     Comments: Unable to assess      Labs on Admission: I have personally reviewed following labs and imaging studies  CBC: Recent Labs  Lab 04/11/23 1509 04/16/23 1448 04/16/23 1555  WBC 7.3 9.3  --   HGB 12.6* 12.7* 12.9*  HCT 39.2 39.4 38.0*  MCV 86.4 83.3  --   PLT 281.0 311  --    Basic Metabolic Panel: Recent Labs  Lab 04/11/23 1509 04/16/23 1448 04/16/23 1555 04/16/23 1800 04/16/23 2216  NA 136 134* 132* 135 134*  K 4.2 4.6 5.0 4.9 4.6  CL 91* 94*  --  98 98  CO2 27 21*  --  22 22  GLUCOSE 393* 407*  --  393* 342*  BUN 23 31*  --  29* 25*  CREATININE 1.64* 1.34*  --  1.28* 1.14  CALCIUM  9.1 9.2  --  8.7* 8.6*   GFR: Estimated Creatinine Clearance: 70.9 mL/min (by C-G formula based on SCr of 1.14 mg/dL). Liver Function Tests: Recent Labs  Lab 04/11/23 1509 04/16/23 1448  AST 22 40  ALT 23 32  ALKPHOS 46 44  BILITOT 0.6 1.2  PROT 6.4 7.4  ALBUMIN 4.2 4.2   Recent Labs  Lab 04/16/23 1448  LIPASE 35   No results for input(s): AMMONIA in the last 168 hours. Coagulation Profile: No results for input(s): INR, PROTIME in the last 168 hours. Cardiac Enzymes: No results for input(s): CKTOTAL, CKMB, CKMBINDEX, TROPONINI, TROPONINIHS in the last 168 hours. BNP (last 3 results) No results for input(s): BNP in the last 8760 hours. HbA1C: No results for input(s): HGBA1C  in the last 72 hours. CBG: Recent Labs  Lab 04/16/23 1444 04/16/23 1757 04/17/23 0214  GLUCAP 352* 374* 283*   Lipid Profile: No results for input(s): CHOL, HDL, LDLCALC, TRIG, CHOLHDL, LDLDIRECT in the last 72 hours. Thyroid  Function Tests: No results for input(s): TSH, T4TOTAL, FREET4, T3FREE, THYROIDAB in the last 72 hours. Anemia Panel: No results for input(s): VITAMINB12, FOLATE, FERRITIN, TIBC, IRON, RETICCTPCT in  the last 72 hours. Urine analysis:    Component Value Date/Time   COLORURINE YELLOW 04/16/2023 1437   APPEARANCEUR CLEAR 04/16/2023 1437   LABSPEC 1.015 04/16/2023 1437   PHURINE 5.0 04/16/2023 1437   GLUCOSEU >=500 (A) 04/16/2023 1437   HGBUR NEGATIVE 04/16/2023 1437   BILIRUBINUR NEGATIVE 04/16/2023 1437   KETONESUR 40 (A) 04/16/2023 1437   PROTEINUR NEGATIVE 04/16/2023 1437   NITRITE NEGATIVE 04/16/2023 1437   LEUKOCYTESUR NEGATIVE 04/16/2023 1437    Radiological Exams on Admission: I have personally reviewed images CT Head Wo Contrast Result Date: 04/16/2023 CLINICAL DATA:  Headache, increasing frequency or severity. Dizziness and vomiting. EXAM: CT HEAD WITHOUT CONTRAST TECHNIQUE: Contiguous axial images were obtained from the base of the skull through the vertex without intravenous contrast. RADIATION DOSE REDUCTION: This exam was performed according to the departmental dose-optimization program which includes automated exposure control, adjustment of the mA and/or kV according to patient size and/or use of iterative reconstruction technique. COMPARISON:  Head MRI 11/08/2022 FINDINGS: Brain: There is no evidence of an acute infarct, intracranial hemorrhage, mass, midline shift, or extra-axial fluid collection. Patchy hypodensities in the cerebral white matter are nonspecific but compatible with moderate chronic small vessel ischemic disease. 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 Mild cerebral atrophy is within normal limits for age. Vascular: Calcified atherosclerosis at the skull base. No hyperdense vessel. Skull: No acute fracture or suspicious osseous lesion. Sinuses/Orbits: Paranasal sinuses and mastoid air cells are clear. Bilateral cataract extraction. Other: None. IMPRESSION: 1. No evidence of acute intracranial abnormality. 2. Moderate chronic small vessel ischemic disease. Electronically Signed   By: Dasie Hamburg M.D.   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   Comparing with previous EKG 10/2019 unchanged and similar finding.   Assessment/Plan: Principal Problem:   DKA (diabetic ketoacidosis) (HCC) Active Problems:   Chronic vertigo   Insulin  dependent type 2 diabetes mellitus (HCC)   Peripheral neuropathy   Essential hypertension   BPH (benign prostatic hyperplasia)   Hyperlipidemia   Acute kidney injury 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)    Assessment and Plan: DKA-resolved Insulin -dependent DM type II Anion gap elevated acidosis-secondary to DKA Hyponatremia secondary to hyperglycemia -Patient present emergency department complaining of dizziness, nausea and vomiting.  Patient found hypertensive in the ED.  Also workup showed DKA. -At presentation to ED patient found to have elevated blood glucose 407, low bicarb 21, elevated anion gap 19 and currently sodium 136.  VBG showed pH 7.4, pCO2 36, pO2 45, bicarb 23 and acid-base deficient 1.  Beta-hydroxybutyrate level 1.85.  A1c 12.4. -In the ED patient received total 4 L of NS and LR with that blood glucose has been improved, anion gap remained closed and bicarb level has been improved.  Patient never has been initiated on insulin  drip - BMP from 11 PM showed bicarb 22, blood glucose 342 and anion gap 14.  Given anion gap is closed and bicarb level within  normal range for consecutive 2 lab work not initiating insulin  drip anymore. - Starting long-acting insulin  20 unit, POC blood glucose check every 4 hours with high sliding scale insulin  as needed coverage. - Patient is unsure about insulin  regimen dosing at home.  Need to verify with pharmacy before reinitiating home insulin  regimen.  (Per chart review patient is on long-acting insulin  15 units in the morning, 70 unit in the nighttime and lispro 25 units 3 times daily?). -After verification with pharmacy can resume  home insulin  regimen with same dose. -Consulted inpatient diabetic educator.  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 - Presentation creatinine was 1.3 which has been improved to 1.14.  Per chart review patient's baseline creatinine around 1.2-1.6 over the course of last 5 months which is probably progression of the CKD. - Continue monitor renal function and avoid nephrotoxic agent. -Encouraging oral hydration.  Chronic vertigo -Patient has history of chronic vertigo follows ENT outpatient.  Due to nausea unable to tolerate meclizine .  CT head no acute intracranial finding. - Obtaining MRI w/wo contrast to make sure there is no intracranial abnormality which is causing central vertigo. -Continue meclizine  as needed - Consulted inpatient PT vestibular therapy to assess balance.  Peripheral neuropathy -Not currently on any home regimen.  Essential hypertension -Blood pressure is persistently elevated in the ED. - Resuming home amlodipine  5 mg and starting hydralazine  as needed.  BPH -Continue Flomax  and Proscar .  History of breast cancer (history of ductal carcinoma of the right breast) - Patient follows oncologist with [facility]. -During discharge will resume tamoxifen .   DVT prophylaxis:  SCDs.  Deferring pharmacological prophylaxis until MRI will be resulted Code Status:  Full Code Diet: Heart healthy carb modified Family Communication:   Family was present at bedside, at the time of interview.  Opportunity was given to ask question and all questions were answered satisfactorily.  Disposition Plan: Pending improvement in of blood glucose level, pharmacy verification of home insulin  regimen, PT vestibular evaluation and MRI of the brain.  Tentative discharge to home next 1 to 2 days Consults: PT vestibular therapy Admission status:   Observation, Step Down Unit  Severity of  Illness: The appropriate patient status for this patient is OBSERVATION. Observation status is judged to be reasonable and necessary in order to provide the required intensity of service to ensure the patient's safety. The patient's presenting symptoms, physical exam findings, and initial radiographic and laboratory data in the context of their medical condition is felt to place them at decreased risk for further clinical deterioration. Furthermore, it is anticipated that the patient will be medically stable for discharge from the hospital within 2 midnights of admission.     Janese Radabaugh, MD [facility]  How to contact the [facility] Attending or Consulting provider 7A - 7P or covering provider during after hours 7P -7A, for this patient.  Check the care team in [facility] and look for a) attending/consulting [facility] provider listed and b) the [facility] team listed Log into [email or web address] and use [place]'s universal password to access. If you do not have the password, please contact the hospital operator. Locate the [facility] provider you are looking for under [facility] and page to a number that you can be directly reached. If you still have difficulty reaching the provider, please page the [facility] (Director on Call) for the Hospitalists listed on amion for assistance.  04/17/2023, 2:45 AM

## 2023-04-17 NOTE — Plan of Care (Signed)
  Problem: Clinical Measurements: Goal: Respiratory complications will improve Outcome: Progressing   Problem: Elimination: Goal: Will not experience complications related to urinary retention Outcome: Progressing   Problem: Safety: Goal: Ability to remain free from injury will improve Outcome: Progressing   Problem: Skin Integrity: Goal: Risk for impaired skin integrity will decrease Outcome: Progressing   Problem: Health Behavior/Discharge Planning: Goal: Ability to manage health-related needs will improve Outcome: Not Progressing   Problem: Activity: Goal: Risk for activity intolerance will decrease Outcome: Not Progressing   Problem: Nutrition: Goal: Adequate nutrition will be maintained Outcome: Not Progressing   Problem: Coping: Goal: Level of anxiety will decrease Outcome: Not Progressing   Problem: Coping: Goal: Ability to adjust to condition or change in health will improve Outcome: Not Progressing   Problem: Metabolic: Goal: Ability to maintain appropriate glucose levels will improve Outcome: Not Progressing   Problem: Nutritional: Goal: Progress toward achieving an optimal weight will improve Outcome: Not Progressing

## 2023-04-17 NOTE — Progress Notes (Signed)
 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 Gar [place] Worked up previously by ENT Dr. Soldatova for vertigo July 2024-went to vestibular rehab did not tolerate Dix-Hallpike Epley--still pending objective vestibular testing-was supposed to have hearing test and MRI-MRI 11/08/2022 showed multiple chronic small vessel infarcts and deep gray nuclei and chronic small vessel ischemia  12/30 recent PCP visit SOB with doing stairs-was worked up with EKG which seemed negative 1/4 MCHP-- vertigo with nausea but no vomit-inability to tolerate p.o. unable to move He appeared to be in mild DKA and was unclear about what medications he was supposed to take-he never needed insulin  drip Sodium 135 BUN/creatinine 29/1.2 glucose 393 CT head no evidence of acute intracranial abnormality moderate small vessel disease  Has had issues with vertigo previously as dictated above but never as severe as this could not walk could not move dry heaves but no overt vomit Tells me vestibular rehab rehab did not really help meclizine  has been trialed in the past did not really help Seems quite dejected about this and somewhat frustrated Does not want to get out of bed this morning and initially declined MRI but explained to him we need to work it up  Will ask either ENT or neurology to see once studies are done We will ask vestibular PT to see the patient  as well to see if this makes a difference Ultimately I do not know if this was a result of his chemotherapy and if he has a guaranteed fix for the same but we will see  No charge  Reggie Grimes, MD [facility] 9:36 AM

## 2023-04-17 NOTE — Hospital Course (Signed)
 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 Gar [place] Worked up previously by ENT Dr. Soldatova for vertigo July 2024-went to vestibular rehab did not tolerate Dix-Hallpike Epley--still pending objective vestibular testing-was supposed to have hearing test and MRI-MRI 11/08/2022 showed multiple chronic small vessel infarcts and deep gray nuclei and chronic small vessel ischemia  12/30 recent PCP visit SOB with doing stairs-was worked up with EKG which seemed negative 1/4 MCHP-- vertigo vomiting-inability to tolerate p.o. unable to move He appeared to be in mild DKA and was unclear about what medications he was supposed to take-he never needed insulin  drip Sodium 135 BUN/creatinine 29/1.2 glucose 393 CT head no evidence of acute intracranial abnormality moderate small vessel disease

## 2023-04-17 NOTE — Inpatient Diabetes Management (Addendum)
 Inpatient Diabetes Program Recommendations  AACE/ADA: New Consensus Statement on Inpatient Glycemic Control (2015)  Target Ranges:  Prepandial:   less than 140 mg/dL      Peak postprandial:   less than 180 mg/dL (1-2 hours)      Critically ill patients:  140 - 180 mg/dL    Latest Reference Range & Units 04/11/23 15:09  Hemoglobin A1C 4.6 - 6.5 % 12.4 (H)  (H): Data is abnormally high  Latest Reference Range & Units 04/16/23 14:48  Glucose 70 - 99 mg/dL 592 (H)  (H): Data is abnormally high  Latest Reference Range & Units 04/16/23 14:44 04/16/23 17:57 04/17/23 02:14 04/17/23 08:49  Glucose-Capillary 70 - 99 mg/dL 647 (H) 625 (H) 716 (H)  8 units Novolog  @0303   20 units Semglee  @0407  337 (H)  11 units Novolog    (H): Data is abnormally high    Admit with: DKA/ 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   History: 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/2023)  Home DM Meds: Humalog  25 units TID with meals       Metformin  1000 mg BID       Toujeo  15 units AM/ 70 units PM  Current Orders: Semglee  25 units daily      Novolog  Moderate Correction Scale/ SSI (0-15 units) Q4 hours    MD- Note Semglee  and Novolog  insulins started early this AM  May consider starting Novolog  Meal Coverage Insulin  as pt takes fair amount of Humalog  at home:  Novolog  6 units TID with meals to start HOLD if pt NPO HOLD if pt eats <50% meals    --Will follow patient during hospitalization--  Adina Rudolpho Arrow RN, MSN, CDCES Diabetes Coordinator Inpatient Glycemic Control Team Team Pager: [phone] (8a-5p)

## 2023-04-17 NOTE — Evaluation (Signed)
 Physical Therapy Evaluation & Vestibular Assessment Patient Details Name: John [name] MRN: [id number] DOB: [birth year] Today's Date: 04/17/2023  History of Present Illness  Pt is a 78 y.o. male who presented 04/16/23 with dizziness and nausea. CT head no acute intracranial finding. MRI brain pending. Pt also noted to have hyperglycemic crisis/early development of DKA and acute kidney injury. PMH: chronic vertigo, insulin -dependent DM2, HTN, HLD, peripheral neuropathy, breast cancer, carpal tunnel syndrome   Clinical Impression  Pt presents with condition above and deficits mentioned below, see PT Problem List. Pt is a questionable historian with questionable memory, awareness, and processing speeds at times during the session. He reports he was independent without AD for functional mobility PTA. He reports he lives with his wife in a 2-level house with 3 STE. His shower and bedroom are upstairs but he has been sleeping in the recliner downstairs for at least 6 months. Currently, pt is severely limited in functional mobility independence and tolerance by his dizziness and nausea, see Vestibular Assessment below. 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 Did not treat it today per pt request as the pt was very fatigued from all the testing and attempts at OOB mobility. Will plan to treat it in future sessions. The pt also demonstrates deficits in gross strength, coordination, balance, and activity tolerance. He is requiring heavy modA for bed mobility and transfers and was unable to ambulate this date. As pt has had a drastic functional decline and reports good support at home, he could greatly benefit from intensive inpatient rehab, > 3 hours/day. Will continue to follow acutely.   Vestibular Assessment - 04/17/23 0001       Vestibular Assessment   General Observation While the pt reports his current dizziness does not feel like the vertigo he is familiar with a few months prior, he was  noted to have subtle R beating nystagmus when mobilizing. Upon further vestibular testing, he appeared to have upbeating nystagmus with a R rotary component and his symptoms were provoked the most with R [facility] testing, suggesting R posterior canal BPPV. Will plan to treat in future sessions.      Symptom Behavior   Subjective history of current problem The pt reports he had x4 episodes of vertigo back in August 2024, but reports he does not currently have vertigo as it does not feel the same. He reports he currently just has staggering, which he reports occurs spontaneously and is worse some days than others, contributing it to just imbalance from his peripheral neuropathy from his chemo treatments. Upon further questioning, pt reported the vertigo in the past felt as if the whole world went crazy and spun around him. Currently, he describes his dizziness as lightheadedness and spinning in my head, but denies the spinning to be the same as before.    Type of Dizziness  Imbalance;Spinning;Lightheadedness    Frequency of Dizziness unsure, seems to vary day to day    Duration of Dizziness appeared to last as little as <40 seconds to as long as several minutes this session    Symptom Nature Motion provoked;Spontaneous    Aggravating Factors Activity in general;Supine to sit;Sit to stand;Comment   sit to supine   Relieving Factors Rest    Progression of Symptoms --   unsure   History of similar episodes Pt and chart reports hx of vertigo in August 2024, he attended x4 OPPT sessions to try to address it but they told me there wasn't  anything else they could do and they were waisting my time      Oculomotor Exam   Oculomotor Alignment Normal    Ocular ROM WFL    Spontaneous Absent    Smooth Pursuits Saccades      Oculomotor Exam-Fixation Suppressed    Ocular Alignment Normal    Ocular ROM WFL    Left Head Impulse R beating nystagmus noted    Right Head Impulse R beating nystagmus  noted      Positional Testing   Dix-Hallpike Dix-Hallpike Right;Dix-Hallpike Left    Horizontal Canal Testing Horizontal Canal Right;Horizontal Canal Left      Dix-Hallpike Right   Dix-Hallpike Right Duration <90 seconds    Dix-Hallpike Right Symptoms Upbeat, right rotatory nystagmus;Other (comment)   pt reported the most intense symptoms this session with this position     Dix-Hallpike Left   Dix-Hallpike Left Duration <60 seconds    Dix-Hallpike Left Symptoms Upbeat, right rotatory nystagmus      Horizontal Canal Right   Horizontal Canal Right Duration R nystagmus noted, denied symptoms    Horizontal Canal Right Symptoms Nystagmus;Other (comment)   denied symptoms     Horizontal Canal Left   Horizontal Canal Left Duration R nystagmus noted, denied symptoms    Horizontal Canal Left Symptoms Nystagmus;Other (comment)   denied symptoms     Cognition   Cognition Comment Cognition appears impaired in regards to memory, processing speed, and awareness but may be baseline      Positional Sensitivities   Sit to Supine Vomiting    Supine to Sitting Severe dizziness    Right Hallpike Severe dizziness    Up from Right Hallpike Moderate dizziness    Up from Left Hallpike Moderate dizziness      Orthostatics   BP supine (x 5 minutes) 158/72    BP sitting 149/78    BP standing (after 1 minute) 142/125                       If plan is discharge home, recommend the following: Two people to help with walking and/or transfers;A lot of help with bathing/dressing/bathroom;Assistance with cooking/housework;Direct supervision/assist for financial management;Direct supervision/assist for medications management;Assist for transportation;Help with stairs or ramp for entrance   Can travel by private vehicle        Equipment Recommendations Wheelchair (measurements PT);Wheelchair cushion (measurements PT);Hospital bed;Hoyer lift (pending progress)  Recommendations for Other Services   Rehab consult;OT consult    Functional Status Assessment Patient has had a recent decline in their functional status and demonstrates the ability to make significant improvements in function in a reasonable and predictable amount of time.     Precautions / Restrictions Precautions Precautions: Fall Precaution Comments: R BPPV? Restrictions Weight Bearing Restrictions Per Provider Order: No      Mobility  Bed Mobility Overal bed mobility: Needs Assistance Bed Mobility: Supine to Sit, Sit to Supine     Supine to sit: Mod assist, HOB elevated Sit to supine: Mod assist, HOB elevated   General bed mobility comments: Pt brought legs off L EOB but needed modA to ascend trunk and scoot hips to EOB using bed pad as pt initially unable to sit all the way up due to dizziness. ModA needed to lift legs and direct trunk back to supine    Transfers Overall transfer level: Needs assistance Equipment used: Rolling walker (2 wheels) Transfers: Sit to/from Stand Sit to Stand: Mod assist, From elevated surface  General transfer comment: ModA to power up to stand, gain balance, and shift weight anteriorly transferring to stand from elevated EOB    Ambulation/Gait               General Gait Details: encouraged pt to attempt but pt repeated I can't then sat himself back down on EOB  Stairs            Wheelchair Mobility     Tilt Bed    Modified Rankin (Stroke Patients Only) Modified Rankin (Stroke Patients Only) Pre-Morbid Rankin Score: Slight disability Modified Rankin: Severe disability     Balance Overall balance assessment: Needs assistance Sitting-balance support: Bilateral upper extremity supported, Feet supported Sitting balance-Leahy Scale: Poor Sitting balance - Comments: Posterior lean initially due to dizziness, thereby needing mod-maxA to sit up; progressed to CGA-minA as dizziness and posterior lean reduced Postural control: Posterior  lean Standing balance support: Bilateral upper extremity supported, During functional activity, Reliant on assistive device for balance Standing balance-Leahy Scale: Poor Standing balance comment: Posterior lean, modA to stand with RW                             Pertinent Vitals/Pain Pain Assessment Pain Assessment: Faces Faces Pain Scale: Hurts even more Pain Location: nausea with mobility Pain Descriptors / Indicators: Discomfort, Grimacing, Other (Comment) (nausea; unsuccessfuly attempts at emesis) Pain Intervention(s): Limited activity within patient's tolerance, Monitored during session, Repositioned    Home Living Family/patient expects to be discharged to:: Private residence Living Arrangements: Spouse/significant other Available Help at Discharge: Family;Available 24 hours/day Type of Home: House Home Access: Stairs to enter Entrance Stairs-Rails: Right Entrance Stairs-Number of Steps: 3   Home Layout: Two level;Bed/bath upstairs;Other (Comment) (been sleeping in recliner downstairs for past > 6 months but goes upstairs to shower) Home Equipment: Agricultural [employer]ant (2 wheels);BSC/3in1 Additional Comments: unsure of accuracy as pt has questionable cognition    Prior Function Prior Level of Function : Independent/Modified Independent;Driving;Patient poor historian/Family not available             Mobility Comments: No AD       Extremity/Trunk Assessment   Upper Extremity Assessment Upper Extremity Assessment: Defer to OT evaluation    Lower Extremity Assessment Lower Extremity Assessment: Generalized weakness (bil peripheral neuropathy up to knees; gross incoordination bil)    Cervical / Trunk Assessment Cervical / Trunk Assessment: Other exceptions (forward head)  Communication   Communication Communication: Hearing impairment  Cognition Arousal: Alert Behavior During Therapy: Flat affect Overall Cognitive Status: No family/caregiver present to  determine baseline cognitive functioning                                 General Comments: Pt demonstrating some deficits in memory, either repeating some things on occasion or expressing he did not know where he was when he was first brought here. Yet, able to recall this therapist's name at end of session. No family present to confirm baseline        General Comments General comments (skin integrity, edema, etc.): unsure of reliability of pleth but momentarily read as low as 70s% on RA when ambulating then quickly rebounded; BP 158/72 supine, 149/78 sitting, 142/125 standing    Exercises     Assessment/Plan    PT Assessment Patient needs continued PT services  PT Problem List Decreased strength;Decreased activity tolerance;Decreased balance;Decreased mobility;Decreased cognition;Decreased coordination  PT Treatment Interventions DME instruction;Gait training;Functional mobility training;Therapeutic activities;Stair training;Therapeutic exercise;Balance training;Neuromuscular re-education;Patient/family education;Cognitive remediation    PT Goals (Current goals can be found in the Care Plan section)  Acute Rehab PT Goals Patient Stated Goal: to know what is going on and improve PT Goal Formulation: With patient Time For Goal Achievement: 05/01/23 Potential to Achieve Goals: Fair    Frequency Min 1X/week     Co-evaluation               AM-PAC PT 6 Clicks Mobility  Outcome Measure Help needed turning from your back to your side while in a flat bed without using bedrails?: A Lot Help needed moving from lying on your back to sitting on the side of a flat bed without using bedrails?: A Lot Help needed moving to and from a bed to a chair (including a wheelchair)?: A Lot Help needed standing up from a chair using your arms (e.g., wheelchair or bedside chair)?: A Lot Help needed to walk in hospital room?: Total Help needed climbing 3-5 steps with a  railing? : Total 6 Click Score: 10    End of Session Equipment Utilized During Treatment: Gait belt Activity Tolerance: Other (comment) (limited by dizziness and nausea) Patient left: in bed;with bed alarm set;with SCD's reapplied;with call bell/phone within reach Nurse Communication: Mobility status PT Visit Diagnosis: Unsteadiness on feet (R26.81);Other abnormalities of gait and mobility (R26.89);Muscle weakness (generalized) (M62.81);Difficulty in walking, not elsewhere classified (R26.2);Dizziness and giddiness (R42);BPPV BPPV - Right/Left : Right    Time: 8752-8649 PT Time Calculation (min) (ACUTE ONLY): 63 min   Charges:   PT Evaluation $PT Eval Moderate Complexity: 1 Mod PT Treatments $Therapeutic Activity: 38-52 mins PT General Charges $$ ACUTE PT VISIT: 1 Visit         Theo Ferretti, PT, DPT Acute Rehabilitation Services  Office: [phone]   Theo CHRISTELLA Ferretti 04/17/2023, 3:09 PM

## 2023-04-18 ENCOUNTER — Observation Stay (HOSPITAL_COMMUNITY): Payer: Medicare Other

## 2023-04-18 DIAGNOSIS — E1110 Type 2 diabetes mellitus with ketoacidosis without coma: Secondary | ICD-10-CM | POA: Diagnosis not present

## 2023-04-18 LAB — COMPREHENSIVE METABOLIC PANEL
ALT: 25 U/L (ref 0–44)
AST: 36 U/L (ref 15–41)
Albumin: 3 g/dL — ABNORMAL LOW (ref 3.5–5.0)
Alkaline Phosphatase: 40 U/L (ref 38–126)
Anion gap: 10 (ref 5–15)
BUN: 17 mg/dL (ref 8–23)
CO2: 23 mmol/L (ref 22–32)
Calcium: 8.9 mg/dL (ref 8.9–10.3)
Chloride: 106 mmol/L (ref 98–111)
Creatinine, Ser: 0.99 mg/dL (ref 0.61–1.24)
GFR, Estimated: >60 mL/min (ref >60)
Glucose, Bld: 158 mg/dL — ABNORMAL HIGH (ref 70–99)
Potassium: 3.5 mmol/L (ref 3.5–5.1)
Sodium: 139 mmol/L (ref 135–145)
Total Bilirubin: 1 mg/dL (ref 0.0–1.2)
Total Protein: 5.7 g/dL — ABNORMAL LOW (ref 6.5–8.1)

## 2023-04-18 LAB — CBC
HCT: 37.3 % — ABNORMAL LOW (ref 39.0–52.0)
Hemoglobin: 12 g/dL — ABNORMAL LOW (ref 13.0–17.0)
MCH: 27.4 pg (ref 26.0–34.0)
MCHC: 32.2 g/dL (ref 30.0–36.0)
MCV: 85.2 fL (ref 80.0–100.0)
Platelets: 262 10*3/uL (ref 150–400)
RBC: 4.38 MIL/uL (ref 4.22–5.81)
RDW: 15.4 % (ref 11.5–15.5)
WBC: 11.7 10*3/uL — ABNORMAL HIGH (ref 4.0–10.5)
nRBC: 0 % (ref 0.0–0.2)

## 2023-04-18 LAB — GLUCOSE, CAPILLARY
Glucose-Capillary: 153 mg/dL — ABNORMAL HIGH (ref 70–99)
Glucose-Capillary: 173 mg/dL — ABNORMAL HIGH (ref 70–99)
Glucose-Capillary: 189 mg/dL — ABNORMAL HIGH (ref 70–99)
Glucose-Capillary: 246 mg/dL — ABNORMAL HIGH (ref 70–99)
Glucose-Capillary: 282 mg/dL — ABNORMAL HIGH (ref 70–99)

## 2023-04-18 MED ORDER — INSULIN ASPART PROT & ASPART (70-30 MIX) 100 UNIT/ML ~~LOC~~ SUSP
10.0000 [IU] | Freq: Two times a day (BID) | SUBCUTANEOUS | Status: DC
  Administered 2023-04-18 – 2023-04-19 (×2): 10 [IU] via SUBCUTANEOUS
  Filled 2023-04-18: qty 10

## 2023-04-18 NOTE — Progress Notes (Signed)
 Physical Therapy Treatment Patient Details Name: John [name] MRN: [id number] DOB: November 12, 1945 Today's Date: 04/18/2023   History of Present Illness Pt is a 78 y.o. male who presented 04/16/23 with dizziness and nausea. CT head no acute intracranial finding. MRI brain pending. Pt also noted to have hyperglycemic crisis/early development of DKA and acute kidney injury. PMH: chronic vertigo, insulin -dependent DM2, HTN, HLD, peripheral neuropathy, breast cancer, carpal tunnel syndrome    PT Comments  Patient presents with symptoms consistent with possible central cause for symptoms.  Note more difficulty describing symptoms and pt very sensitive to movement stating feels symptoms when initiating movement.  Noted direction changing nystagmus (R beating with looking R and L beating when looking L) and significant ataxia with ambulation needing mod A for balance.  Patient with history of neuropathy and wears glasses for reading and for driving.  Patient likely very high risk for falls at home and concern for home with only wife support.  Recommend further neuro testing and continued skilled PT in the acute setting.  If no further findings with testing will need follow up vestibular rehab as outpatient in [facility] ([address] or [facility] at [facility]).  Patient and wife educated in possible pathologies and need for continued movement for habituation and balance practice.    Vestibular Assessment - 04/18/23 0001       Oculomotor Exam   Oculomotor Alignment Normal    Ocular ROM WFL    Gaze-induced  Right beating nystagmus with R gaze;Left beating nystagmus with L gaze;Direction changing nystagmus    Smooth Pursuits Saccades    Saccades Comment   some nystagmus noted to lateral aspects bilaterally     Oculomotor Exam-Fixation Suppressed    Left Head Impulse WNL    Right Head Impulse positive for refixation      Auditory   Comments intact to scratch test bilaterally, hears better in L ear  traditionally      Positional Testing   Sidelying Test Sidelying Right;Sidelying Left    Horizontal Canal Testing Horizontal Canal Right;Horizontal Canal Left      Sidelying Right   Sidelying Right Duration 60 sec    Sidelying Right Symptoms No nystagmus      Sidelying Left   Sidelying Left Duration 60 sec    Sidelying Left Symptoms No nystagmus      Horizontal Canal Right   Horizontal Canal Right Duration 60 sec    Horizontal Canal Right Symptoms Normal;Nystagmus   normal with testing; central gaze; looking up to L L beating nystagmus without symptoms     Horizontal Canal Left   Horizontal Canal Left Duration 60 sec    Horizontal Canal Left Symptoms Normal;Nystagmus   normal in central gaze, R beating nystagmus looking up to R, no symptoms               If plan is discharge home, recommend the following: A lot of help with walking and/or transfers;A little help with bathing/dressing/bathroom;Assist for transportation;Assistance with cooking/housework;Help with stairs or ramp for entrance   Can travel by private vehicle        Equipment Recommendations  Wheelchair (measurements PT) (transport wheelchair)    Recommendations for Other Services       Precautions / Restrictions Precautions Precautions: Fall Precaution Comments: R BPPV? Restrictions Weight Bearing Restrictions Per Provider Order: No     Mobility  Bed Mobility Overal bed mobility: Needs Assistance Bed Mobility: Rolling, Sit to Sidelying, Sidelying to Sit Rolling: Supervision Sidelying to sit:  Min assist Supine to sit: Min assist Sit to supine: Mod assist Sit to sidelying: Mod assist General bed mobility comments: up to EOB initially with A for balance and to lift trunk; position testing for BPPV to sidelying and back up with A for legs and positioning for testing    Transfers Overall transfer level: Needs assistance Equipment used: Rolling walker (2 wheels) Transfers: Sit to/from Stand Sit to  Stand: Mod assist           General transfer comment: some lifting help to stand from EOB    Ambulation/Gait Ambulation/Gait assistance: Min assist, Mod assist Gait Distance (Feet): 20 Feet Assistive device: Rolling walker (2 wheels) Gait Pattern/deviations: Step-to pattern, Step-through pattern, Decreased stride length, Staggering left       General Gait Details: assist for balance and LOB x 2 during ambulation, cues for eyes on stationary target for visual use for balance, assist for turning RW   Stairs             Wheelchair Mobility     Tilt Bed    Modified Rankin (Stroke Patients Only) Modified Rankin (Stroke Patients Only) Pre-Morbid Rankin Score: Slight disability Modified Rankin: Moderately severe disability     Balance Overall balance assessment: Needs assistance Sitting-balance support: Feet supported Sitting balance-Leahy Scale: Fair     Standing balance support: Bilateral upper extremity supported Standing balance-Leahy Scale: Poor Standing balance comment: UE support for balance                            Cognition Arousal: Alert Behavior During Therapy: Anxious Overall Cognitive Status: Within Functional Limits for tasks assessed                                          Exercises      General Comments General comments (skin integrity, edema, etc.): Wife entered room and concerned with cancelling MRI; education on vestibular pathology and need for vestibular rehab including habituation and balance training.      Pertinent Vitals/Pain Pain Assessment Faces Pain Scale: No hurt    Home Living Family/patient expects to be discharged to:: Private residence Living Arrangements: Spouse/significant other Available Help at Discharge: Family;Available 24 hours/day Type of Home: House Home Access: Stairs to enter Entrance Stairs-Rails: Right Entrance Stairs-Number of Steps: 3   Home Layout: Two level;Bed/bath  upstairs;Other (Comment) Home Equipment: Rolling Walker (2 wheels);BSC/3in1      Prior Function            PT Goals (current goals can now be found in the care plan section) Progress towards PT goals: Progressing toward goals    Frequency    Min 1X/week      PT Plan      Co-evaluation              AM-PAC PT 6 Clicks Mobility   Outcome Measure  Help needed turning from your back to your side while in a flat bed without using bedrails?: A Lot Help needed moving from lying on your back to sitting on the side of a flat bed without using bedrails?: A Lot Help needed moving to and from a bed to a chair (including a wheelchair)?: A Lot Help needed standing up from a chair using your arms (e.g., wheelchair or bedside chair)?: A Lot Help needed to walk in hospital room?:  A Lot Help needed climbing 3-5 steps with a railing? : Total 6 Click Score: 11    End of Session Equipment Utilized During Treatment: Gait belt Activity Tolerance: Patient limited by fatigue Patient left: in bed;with call bell/phone within reach;with family/visitor present   PT Visit Diagnosis: Other abnormalities of gait and mobility (R26.89);Difficulty in walking, not elsewhere classified (R26.2);Other symptoms and signs involving the nervous system (R29.898);Dizziness and giddiness (R42)     Time: 8574-8473 PT Time Calculation (min) (ACUTE ONLY): 61 min  Charges:    $Gait Training: 8-22 mins $Neuromuscular Re-education: 23-37 mins $Self Care/Home Management: 8-22 PT General Charges $$ ACUTE PT VISIT: 1 Visit                     Micheline Portal, PT Acute Rehabilitation Services [phone] 04/18/2023    Montie Portal 04/18/2023, 3:30 PM

## 2023-04-18 NOTE — Progress Notes (Signed)
 Patient is refusing MRI, second attempt

## 2023-04-18 NOTE — Plan of Care (Signed)
  Problem: Education: Goal: Knowledge of General Education information will improve Description: Including pain rating scale, medication(s)/side effects and non-pharmacologic comfort measures Outcome: Progressing   Problem: Health Behavior/Discharge Planning: Goal: Ability to manage health-related needs will improve Outcome: Progressing   Problem: Activity: Goal: Risk for activity intolerance will decrease Outcome: Progressing   Problem: Nutrition: Goal: Adequate nutrition will be maintained Outcome: Progressing   Problem: Elimination: Goal: Will not experience complications related to urinary retention Outcome: Progressing   Problem: Pain Management: Goal: General experience of comfort will improve Outcome: Progressing   Problem: Coping: Goal: Ability to adjust to condition or change in health will improve Outcome: Progressing   Problem: Skin Integrity: Goal: Risk for impaired skin integrity will decrease Outcome: Progressing   Problem: Metabolic: Goal: Ability to maintain appropriate glucose levels will improve Outcome: Not Progressing   Problem: Nutritional: Goal: Progress toward achieving an optimal weight will improve Outcome: Not Progressing

## 2023-04-18 NOTE — Evaluation (Signed)
 Occupational Therapy Evaluation Patient Details Name: John [name] MRN: [id number] DOB: May 14, 1945 Today's Date: 04/18/2023   History of Present Illness Pt is a 78 y.o. male who presented 04/16/23 with dizziness and nausea. CT head no acute intracranial finding. MRI brain pending. Pt also noted to have hyperglycemic crisis/early development of DKA and acute kidney injury. PMH: chronic vertigo, insulin -dependent DM2, HTN, HLD, peripheral neuropathy, breast cancer, carpal tunnel syndrome   Clinical Impression   Pt presents with decline in function and safety with ADLs and ADL mobility with impaired strength, balance and endurance. Pt reports that he has dry heaving at times and dizziness. Pt's BP at rest 148/65, 159/76 sitting EOB. PTA pt lived at home with his wife and reports that he was Ind with ADLs/selfcare, IADLs and mobility. Pt initially agreeable to transfer to chair, then declined once sitting EOB. Pt hyperverbose and required redirection multiple times. Pt would benefit from acute OT services to address impairments to maximize level of function and safety      If plan is discharge home, recommend the following: A lot of help with bathing/dressing/bathroom;A lot of help with walking and/or transfers;Assist for transportation;Help with stairs or ramp for entrance    Functional Status Assessment  Patient has had a recent decline in their functional status and demonstrates the ability to make significant improvements in function in a reasonable and predictable amount of time.  Equipment Recommendations  Other (comment) (TBD at next venue of care)    Recommendations for Other Services       Precautions / Restrictions Precautions Precaution Comments: R BPPV? Restrictions Weight Bearing Restrictions Per Provider Order: No      Mobility Bed Mobility Overal bed mobility: Needs Assistance Bed Mobility: Supine to Sit, Sit to Supine     Supine to sit: Mod assist, HOB elevated Sit  to supine: Mod assist, HOB elevated   General bed mobility comments: Assist to elevate trunk and with LE mgt bakc to supine.    Transfers                   General transfer comment: Pt initially agreeable to transfer to chair, then declined once sitting EOB. Per PT notes, pt requires mod A for sit - stand with RW      Balance Overall balance assessment: Needs assistance Sitting-balance support: Bilateral upper extremity supported, Feet supported Sitting balance-Leahy Scale: Fair                                     ADL either performed or assessed with clinical judgement   ADL Overall ADL's : Needs assistance/impaired Eating/Feeding: Independent   Grooming: Wash/dry hands;Wash/dry face;Contact guard assist;Sitting   Upper Body Bathing: Contact guard assist;Sitting Upper Body Bathing Details (indicate cue type and reason): simulated Lower Body Bathing: Moderate assistance Lower Body Bathing Details (indicate cue type and reason): simulated Upper Body Dressing : Contact guard assist;Sitting                           Vision Ability to See in Adequate Light: 0 Adequate Patient Visual Report: No change from baseline       Perception         Praxis         Pertinent Vitals/Pain Pain Assessment Pain Assessment: No/denies pain Pain Intervention(s): Monitored during session, Repositioned     Extremity/Trunk Assessment Upper  Extremity Assessment Upper Extremity Assessment: Overall WFL for tasks assessed   Lower Extremity Assessment Lower Extremity Assessment: Defer to PT evaluation       Communication Communication Communication: Hearing impairment   Cognition Arousal: Alert Behavior During Therapy: WFL for tasks assessed/performed Overall Cognitive Status: No family/caregiver present to determine baseline cognitive functioning                                 General Comments: pt hyperverbose and required  redirection to initiate tasks     General Comments       Exercises     Shoulder Instructions      Home Living Family/patient expects to be discharged to:: Private residence Living Arrangements: Spouse/significant other Available Help at Discharge: Family;Available 24 hours/day Type of Home: House Home Access: Stairs to enter [employer] of Steps: 3 Entrance Stairs-Rails: Right Home Layout: Two level;Bed/bath upstairs;Other (Comment)     Bathroom Shower/Tub: Producer, Television/film/video: Handicapped height     Home Equipment: Agricultural Consultant (2 wheels);BSC/3in1          Prior Functioning/Environment Prior Level of Function : Independent/Modified Independent;Driving;Patient poor historian/Family not available             Mobility Comments: No AD ADLs Comments: Ind with ADLs per pt report        OT Problem List: Decreased strength;Impaired balance (sitting and/or standing);Decreased activity tolerance;Decreased coordination;Decreased knowledge of use of DME or AE      OT Treatment/Interventions: Self-care/ADL training;DME and/or AE instruction;Therapeutic activities;Balance training;Patient/family education    OT Goals(Current goals can be found in the care plan section) Acute Rehab OT Goals Patient Stated Goal: get well, go home OT Goal Formulation: With patient Time For Goal Achievement: 05/02/23 Potential to Achieve Goals: Good ADL Goals Pt Will Perform Grooming: with supervision;standing Pt Will Perform Upper Body Bathing: with supervision;with set-up;sitting Pt Will Perform Lower Body Bathing: with min assist;sitting/lateral leans;sit to/from stand Pt Will Perform Upper Body Dressing: with supervision;with set-up;sitting Pt Will Transfer to Toilet: with mod assist;with min assist;ambulating;stand pivot transfer;bedside commode;grab bars Pt Will Perform Toileting - Clothing Manipulation and hygiene: with mod assist;with min assist;sit  to/from stand  OT Frequency: Min 1X/week    Co-evaluation              AM-PAC OT 6 Clicks Daily Activity     Outcome Measure Help from another person eating meals?: None Help from another person taking care of personal grooming?: A Little Help from another person toileting, which includes using toliet, bedpan, or urinal?: A Lot Help from another person bathing (including washing, rinsing, drying)?: A Lot Help from another person to put on and taking off regular upper body clothing?: A Little Help from another person to put on and taking off regular lower body clothing?: A Lot 6 Click Score: 16   End of Session    Activity Tolerance: Patient limited by fatigue;Other (comment) (dizziness) Patient left: in bed;with call bell/phone within reach;with bed alarm set  OT Visit Diagnosis: Other abnormalities of gait and mobility (R26.89);Muscle weakness (generalized) (M62.81);BPPV;Dizziness and giddiness (R42)                Time: 8968-8942 OT Time Calculation (min): 26 min Charges:  OT General Charges $OT Visit: 1 Visit OT Evaluation $OT Eval Moderate Complexity: 1 Mod OT Treatments $Therapeutic Activity: 8-22 mins    Jacques Karna Loose 04/18/2023, 1:39 PM

## 2023-04-18 NOTE — Inpatient Diabetes Management (Addendum)
 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 Pt reports last seeing his previous Endocrinologist in over a year. Pt reports a new initial appointment with an Endocrinologist in March of this year. Pt reports not consistently taking his medication at home, oftentimes going out to eat and not taking his insulin  with him and not remembering to take it when he gets home. Pt does reports taking his morning medication all the time. Spoke with pt about possibly him being consistent with taking his medications at home and possibly keeping pt at same doses. Pt is willing to try being more consistent. I encouraged pt to have his PCP in 1-2 weeks make adjustments in his insulin  doses after the hospitalization and after he consistently takes his insulin  doses. Discussed current A1c Level. Reviewed glucose and A1c goals. Comparatively, pt is not requiring as much insulin  as prescribed at home so would probably need adjustment in home meds for safety to prevent hypoglycemia.....  For d/c may consider:  -   basal insulin  dose 28 units -   short acting meal coverage 6 units tid before meals Follow up with PCP in 1-2 weeks for insulin   adjustments when consistently taking all doses of insulin  -  check glucose three times a day before meals Keep new Endocrinology appointment in March   Thanks,  Clotilda Bull RN, MSN, BC-ADM Inpatient Diabetes Coordinator Team Pager [phone] (8a-5p)

## 2023-04-18 NOTE — Progress Notes (Signed)
 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 Gar [place] Worked up previously by ENT Dr. Soldatova for vertigo July 2024-went to vestibular rehab did not tolerate Dix-Hallpike Epley--still pending objective vestibular testing-was supposed to have hearing test and MRI-MRI 11/08/2022 showed multiple chronic small vessel infarcts and deep gray nuclei and chronic small vessel ischemia   12/30 recent PCP visit SOB with doing stairs-was worked up with EKG which seemed negative 1/4 MCHP-- vertigo with nausea but no vomit-inability to tolerate p.o. unable to move He appeared to be in mild DKA and was unclear about what medications he was supposed to take-he never needed insulin  drip Sodium 135 BUN/creatinine 29/1.2 glucose 393 CT head no evidence of acute intracranial abnormality moderate small vessel disease.     Events MRI attempted x 2 with not able to be performed  procedures  Plan  Severe vertigo MRI attempted x 2 we will attempt once more and if unable to do he will probably need to go home with physical therapy-had a clear discussion with his wife about this. He has bidirectional nystagmus so we will pursue the MRI but if it cannot be accomplished he will need to go home with outpatient plans for workup and follow-up with his ENT and should follow-up with ENT as well as with [facility] PT May continue meclizine  12.5 3 times daily as needed  dizziness Hold aspirin  325 possibly as an outpatient as might have risk of bleeding and falls Uncontrolled diabetes mellitus type 2 with neuropathy DKA on admission resolved with regular insulin  only Not taking his meds regularly at home-takes a.m. meds mainly so I will switch him to 70/30 insulin  10 units twice daily today and increase it likely in the morning and continue CBG checks addd back metformin  1000 twice daily Not on any meds for neuropathy We will adjust this plan in the morning to make is not doing management less cumbersome BPH Continue Flomax  0.4, finasteride  5 mg HTN Continue amlodipine  5 Lasix  80 held May need to hold in the outpatient setting Male breast cancer Outpatient follow-up with his regular oncologist in [place]-can resume tamoxifen  20 at home   Discussed with wife on phone  DVT prophylaxis: SCD  Status is: Observation The patient remains OBS appropriate and will d/c before 2 midnights.      Subjective: Coherent-refused MRI this morning and yesterday evening because he could not lay flat Able to sit up at the edge of the bed a little better Was able to walk in the room No chest pain no fever Still a little bit dizzy but was able to lay flat still thinks he wants to go for MRI tomorrow  Objective + exam Vitals:   04/18/23 0355 04/18/23 0520 04/18/23 0821 04/18/23 1153  BP: (!) 177/75  (!) 162/79 (!) 149/90  Pulse:  74 82 86  Resp: (!) 22 19 10 17   Temp: 98.7 F (37.1 C)  98.1 F (36.7 C) 98.1 F (36.7 C)  TempSrc: Oral  Oral  Oral  SpO2:  96% 97% 93%  Weight: 107.9 kg     Height:       Filed Weights   04/16/23 1435 04/17/23 0210 04/18/23 0355  Weight: 107.5 kg 107.5 kg 107.9 kg    Examination: Very pleasant no distress Chest clear S1-S2 no murmur sinus on monitors Abdomen soft no rebound Power 5/5  Data Reviewed: reviewed   CBC    Component Value Date/Time   WBC 11.7 (H) 04/18/2023 0243   RBC 4.38 04/18/2023 0243   HGB 12.0  (L) 04/18/2023 0243   HCT 37.3 (L) 04/18/2023 0243   PLT 262 04/18/2023 0243   MCV 85.2 04/18/2023 0243   MCH 27.4 04/18/2023 0243   MCHC 32.2 04/18/2023 0243   RDW 15.4 04/18/2023 0243   LYMPHSABS 1.4 11/08/2022 2120   MONOABS 0.5 11/08/2022 2120   EOSABS 0.1 11/08/2022 2120   BASOSABS 0.1 11/08/2022 2120      Latest Ref Rng & Units 04/18/2023    2:43 AM 04/17/2023    7:37 AM 04/17/2023    2:01 AM  CMP  Glucose 70 - 99 mg/dL 841  728  689   BUN 8 - 23 mg/dL 17  20  22    Creatinine 0.61 - 1.24 mg/dL 9.00  8.91  8.79   Sodium 135 - 145 mmol/L 139  140  137   Potassium 3.5 - 5.1 mmol/L 3.5  4.0  4.5   Chloride 98 - 111 mmol/L 106  102  100   CO2 22 - 32 mmol/L 23  25  22    Calcium  8.9 - 10.3 mg/dL 8.9  9.0  8.7   Total Protein 6.5 - 8.1 g/dL 5.7  5.7    Total Bilirubin 0.0 - 1.2 mg/dL 1.0  1.0    Alkaline Phos 38 - 126 U/L 40  35    AST 15 - 41 U/L 36  23    ALT 0 - 44 U/L 25  25      Scheduled Meds:  amLODipine   5 mg Oral Daily   aspirin  EC  325 mg Oral Daily   atorvastatin   40 mg Oral Daily   finasteride   5 mg Oral Daily   insulin  aspart  0-15 Units Subcutaneous Q4H   insulin  glargine-yfgn  25 Units Subcutaneous Daily   pantoprazole   40 mg Oral Daily   sodium chloride  flush  3 mL Intravenous Q12H   tamsulosin   0.4 mg Oral Daily   Continuous Infusions:  Time  44  Jai-Gurmukh Marguriete Wootan, MD  [facility]

## 2023-04-18 NOTE — Progress Notes (Signed)
 Inpatient Rehab Admissions Coordinator:   Per therapy recommendation, patient was screened for CIR candidacy by Leita Kleine, MS, CCC-SLP. At this time, Pt. does not appear to demonstrate medical necessity to justify in hospital rehabilitation/CIR. PT also recommending Outpatient for BPPV. I will not pursue a rehab consult for this Pt.   Recommend other rehab venues to be pursued.  Please contact me with any questions.  Leita Kleine, MS, CCC-SLP Rehab Admissions Coordinator  [phone] (celll) [phone] (office)

## 2023-04-19 ENCOUNTER — Observation Stay (HOSPITAL_COMMUNITY): Payer: Medicare Other

## 2023-04-19 DIAGNOSIS — E1110 Type 2 diabetes mellitus with ketoacidosis without coma: Secondary | ICD-10-CM | POA: Diagnosis not present

## 2023-04-19 DIAGNOSIS — I639 Cerebral infarction, unspecified: Secondary | ICD-10-CM

## 2023-04-19 DIAGNOSIS — R42 Dizziness and giddiness: Secondary | ICD-10-CM | POA: Diagnosis not present

## 2023-04-19 LAB — COMPREHENSIVE METABOLIC PANEL
ALT: 28 U/L (ref 0–44)
AST: 39 U/L (ref 15–41)
Albumin: 2.8 g/dL — ABNORMAL LOW (ref 3.5–5.0)
Alkaline Phosphatase: 40 U/L (ref 38–126)
Anion gap: 11 (ref 5–15)
BUN: 18 mg/dL (ref 8–23)
CO2: 23 mmol/L (ref 22–32)
Calcium: 9 mg/dL (ref 8.9–10.3)
Chloride: 103 mmol/L (ref 98–111)
Creatinine, Ser: 1.02 mg/dL (ref 0.61–1.24)
GFR, Estimated: >60 mL/min (ref >60)
Glucose, Bld: 156 mg/dL — ABNORMAL HIGH (ref 70–99)
Potassium: 3 mmol/L — ABNORMAL LOW (ref 3.5–5.1)
Sodium: 137 mmol/L (ref 135–145)
Total Bilirubin: 1.3 mg/dL — ABNORMAL HIGH (ref 0.0–1.2)
Total Protein: 5.5 g/dL — ABNORMAL LOW (ref 6.5–8.1)

## 2023-04-19 LAB — GLUCOSE, CAPILLARY
Glucose-Capillary: 149 mg/dL — ABNORMAL HIGH (ref 70–99)
Glucose-Capillary: 165 mg/dL — ABNORMAL HIGH (ref 70–99)
Glucose-Capillary: 222 mg/dL — ABNORMAL HIGH (ref 70–99)
Glucose-Capillary: 350 mg/dL — ABNORMAL HIGH (ref 70–99)
Glucose-Capillary: 298 mg/dL — ABNORMAL HIGH (ref 70–99)

## 2023-04-19 LAB — CBC
HCT: 37.1 % — ABNORMAL LOW (ref 39.0–52.0)
Hemoglobin: 12 g/dL — ABNORMAL LOW (ref 13.0–17.0)
MCH: 27 pg (ref 26.0–34.0)
MCHC: 32.3 g/dL (ref 30.0–36.0)
MCV: 83.4 fL (ref 80.0–100.0)
Platelets: 253 10*3/uL (ref 150–400)
RBC: 4.45 MIL/uL (ref 4.22–5.81)
RDW: 15.2 % (ref 11.5–15.5)
WBC: 8.2 10*3/uL (ref 4.0–10.5)
nRBC: 0 % (ref 0.0–0.2)

## 2023-04-19 MED ORDER — POTASSIUM CHLORIDE CRYS ER 20 MEQ PO TBCR
40.0000 meq | EXTENDED_RELEASE_TABLET | Freq: Every day | ORAL | Status: DC
  Administered 2023-04-19 – 2023-04-22 (×4): 40 meq via ORAL
  Filled 2023-04-19 (×4): qty 2

## 2023-04-19 MED ORDER — GADOBUTROL 1 MMOL/ML IV SOLN
10.0000 mL | Freq: Once | INTRAVENOUS | Status: AC | PRN
  Administered 2023-04-19: 10 mL via INTRAVENOUS

## 2023-04-19 MED ORDER — INSULIN ASPART PROT & ASPART (70-30 MIX) 100 UNIT/ML ~~LOC~~ SUSP
15.0000 [IU] | Freq: Two times a day (BID) | SUBCUTANEOUS | Status: DC
  Administered 2023-04-19 – 2023-04-20 (×2): 15 [IU] via SUBCUTANEOUS
  Filled 2023-04-19: qty 10

## 2023-04-19 MED ORDER — INSULIN ASPART 100 UNIT/ML IJ SOLN
0.0000 [IU] | Freq: Three times a day (TID) | INTRAMUSCULAR | Status: DC
  Administered 2023-04-20: 5 [IU] via SUBCUTANEOUS
  Administered 2023-04-20: 7 [IU] via SUBCUTANEOUS
  Administered 2023-04-20 – 2023-04-21 (×2): 5 [IU] via SUBCUTANEOUS
  Administered 2023-04-21: 2 [IU] via SUBCUTANEOUS
  Administered 2023-04-21: 3 [IU] via SUBCUTANEOUS
  Administered 2023-04-22: 2 [IU] via SUBCUTANEOUS
  Administered 2023-04-22: 5 [IU] via SUBCUTANEOUS

## 2023-04-19 NOTE — Progress Notes (Signed)
 Physical Therapy Treatment Patient Details Name: John [name] MRN: [id number] DOB: Jan 09, 1946 Today's Date: 04/19/2023   History of Present Illness Pt is a 78 y.o. male who presented 04/16/23 with dizziness and nausea. CT head no acute intracranial finding. MRI brain pending. Pt also noted to have hyperglycemic crisis/early development of DKA and acute kidney injury. PMH: chronic vertigo, insulin -dependent DM2, HTN, HLD, peripheral neuropathy, breast cancer, carpal tunnel syndrome    PT Comments  Pt denies dizziness and nausea however continues to have noted balance impairment falling to the R requiring modA to prevent falling. Pt with noted impaired coordination, ataxic gait pattern, mild L sided weakness, and possible R hypofunction. PTA pt was independent without AD living with spouse. Aware that the patient is waiting on MRI however at this time pt unsafe to return home and would benefit from inpatient rehab program > 3 hrs a day to address above deficits and attain maximal functional recovery. Acute PT to cont to follow.    If plan is discharge home, recommend the following: A lot of help with walking and/or transfers;A little help with bathing/dressing/bathroom;Assist for transportation;Assistance with cooking/housework;Help with stairs or ramp for entrance   Can travel by private vehicle        Equipment Recommendations  Wheelchair (measurements PT) (transport wheelchair)    Recommendations for Other Services Rehab consult;OT consult     Precautions / Restrictions Precautions Precautions: Fall Precaution Comments: R BPPV vs hypofunction vs central cause Restrictions Weight Bearing Restrictions Per Provider Order: No     Mobility  Bed Mobility Overal bed mobility: Needs Assistance Bed Mobility: Supine to Sit     Supine to sit: Contact guard     General bed mobility comments: pt pulled self into log sit position, donned socks at EOB with close contact guard assist, minA  only to scoot R hip forward so foot touched the floor, pt denies dizziness or nausea    Transfers Overall transfer level: Needs assistance Equipment used: Rolling walker (2 wheels) Transfers: Sit to/from Stand Sit to Stand: Mod assist           General transfer comment: modA to power up, max verbal cues for hand placement to push up from bed, pt with posterior bias requiring modA to brings hips forward, pt would brace self on the bed with the back of bilat LEs. worked on standing balance however kept falling to the R    Ambulation/Gait Ambulation/Gait assistance: Mod assist Gait Distance (Feet): 20 Feet (to/from bed to/from door) Assistive device: Rolling walker (2 wheels) Gait Pattern/deviations: Step-to pattern, Step-through pattern, Decreased stride length, Staggering right, Narrow base of support Gait velocity: dec Gait velocity interpretation: <1.8 ft/sec, indicate of risk for recurrent falls   General Gait Details: modA assist to maintain balance due to LOB x 2 to the right during ambulation, cues for eyes on stationary target for visual use for balance, assist for turning RW, near crossover gait, noted ataxia   Stairs             Wheelchair Mobility     Tilt Bed    Modified Rankin (Stroke Patients Only) Modified Rankin (Stroke Patients Only) Pre-Morbid Rankin Score: Slight disability Modified Rankin: Moderately severe disability     Balance Overall balance assessment: Needs assistance Sitting-balance support: Feet supported Sitting balance-Leahy Scale: Fair Sitting balance - Comments: Posterior lean, more prominent when doing dynamic activity, c/o back being itchy, denies dizziness Postural control: Posterior lean Standing balance support: Bilateral upper extremity supported Standing  balance-Leahy Scale: Poor Standing balance comment: UE support for balance, and physical assist to prevent fall backwards                             Cognition Arousal: Alert Behavior During Therapy: Anxious Overall Cognitive Status: No family/caregiver present to determine baseline cognitive functioning                                 General Comments: pt hyperverbose and required redirection to initiate tasks, pt HOH which could contribute to impaired command following and delayed processing, pt initially wanting to go home but s/p PT session pt stated going home may not be the right answer pt more intune to his balance deficits and asking why he keeps falling to the Right        Exercises      General Comments General comments (skin integrity, edema, etc.): completed vestibular assessment again however no noted nystagmus in any directions however did report seeing double when tracking finger with eyes only, noted over correction and inability to focus on finger/nose when doing saccadic movements      Pertinent Vitals/Pain Pain Assessment Pain Assessment: No/denies pain    Home Living Family/patient expects to be discharged to:: Private residence Living Arrangements: Spouse/significant other Available Help at Discharge: Family;Available 24 hours/day Type of Home: House Home Access: Stairs to enter Entrance Stairs-Rails: Right Entrance Stairs-Number of Steps: 3   Home Layout: Two level;Bed/bath upstairs;Other (Comment) Home Equipment: Rolling Walker (2 wheels);BSC/3in1 Additional Comments: unsure of accuracy as pt has questionable cognition    Prior Function            PT Goals (current goals can now be found in the care plan section) Acute Rehab PT Goals Patient Stated Goal: to know what is going on and improve PT Goal Formulation: With patient Time For Goal Achievement: 05/01/23 Potential to Achieve Goals: Fair Progress towards PT goals: Progressing toward goals    Frequency    Min 1X/week      PT Plan      Co-evaluation              AM-PAC PT 6 Clicks Mobility   Outcome  Measure  Help needed turning from your back to your side while in a flat bed without using bedrails?: A Lot Help needed moving from lying on your back to sitting on the side of a flat bed without using bedrails?: A Lot Help needed moving to and from a bed to a chair (including a wheelchair)?: A Lot Help needed standing up from a chair using your arms (e.g., wheelchair or bedside chair)?: A Lot Help needed to walk in hospital room?: A Lot Help needed climbing 3-5 steps with a railing? : Total 6 Click Score: 11    End of Session Equipment Utilized During Treatment: Gait belt Activity Tolerance: Patient tolerated treatment well Patient left: with call bell/phone within reach;in chair Nurse Communication: Mobility status PT Visit Diagnosis: Other abnormalities of gait and mobility (R26.89);Difficulty in walking, not elsewhere classified (R26.2);Other symptoms and signs involving the nervous system (R29.898);Dizziness and giddiness (R42) BPPV - Right/Left : Right     Time: 8961-8888 PT Time Calculation (min) (ACUTE ONLY): 33 min  Charges:    $Gait Training: 8-22 mins $Therapeutic Activity: 8-22 mins PT General Charges $$ ACUTE PT VISIT: 1 Visit  Norene Ames, PT, DPT Acute Rehabilitation Services Secure chat preferred Office #: [phone]    Norene CHRISTELLA Ames 04/19/2023, 12:22 PM

## 2023-04-19 NOTE — TOC Transition Note (Signed)
 Transition of Care [facility]) - Discharge Note   Patient Details  Name: John [name] MRN: [id number] Date of Birth: 08-Aug-1945  [facility] CM/SW Contact:  Montie LOISE Louder, LCSW Phone Number: 04/19/2023, 4:13 PM   Clinical Narrative:     CSW met with patient and his wife,[name]. CSW introduced self and explained role. Patient confirmed he is aware  CIR is no longer an option and is agreeable to short term rehab at [facility]. CSW explained the SNF process. No preferred SNF at this time. All questions answered.   TOC will provide bed offer once available. TOC will continue to follow and assist with discharge planning.  Montie Louder, MSW, LCSW Clinical Social Worker    Final next level of care: Skilled Nursing Facility Barriers to Discharge: English As A Second Language Teacher, Continued Medical Work up, SNF Pending bed offer   Patient Goals and CMS Choice            Discharge Placement                       Discharge Plan and Services Additional resources added to the After Visit Summary for   In-house Referral: Clinical Social Work                                   Social Drivers of Health (SDOH) Interventions SDOH Screenings   Food Insecurity: No Food Insecurity (04/17/2023)  Housing: Low Risk  (04/17/2023)  Transportation Needs: No Transportation Needs (04/17/2023)  Utilities: Not At Risk (04/17/2023)  Alcohol Screen: Low Risk  (01/27/2023)  Depression (PHQ2-9): Low Risk  (01/28/2023)  Financial Resource Strain: Low Risk  (04/07/2023)  Physical Activity: Unknown (04/07/2023)  Social Connections: Moderately Isolated (04/17/2023)  Stress: No Stress Concern Present (04/07/2023)  Tobacco Use: Low Risk  (04/17/2023)     Readmission Risk Interventions     No data to display

## 2023-04-19 NOTE — Progress Notes (Signed)
 Mobility Specialist Progress Note:   04/19/23 1230  Mobility  Activity Transferred from chair to bed  Level of Assistance Moderate assist, patient does 50-74% (+2)  Assistive Device Front wheel walker  Distance Ambulated (ft) 2 ft  Activity Response Tolerated fair  Mobility Referral Yes  Mobility visit 1 Mobility  Mobility Specialist Start Time (ACUTE ONLY) 1145  Mobility Specialist Stop Time (ACUTE ONLY) 1200  Mobility Specialist Time Calculation (min) (ACUTE ONLY) 15 min   Pt received in chair, requesting to return to bed. ModA+2 to stand and pivot d/t posterior lean. Pt c/o dizziness throughout. Pt stating dizziness is worse when turning head. Pt returned to bed with call bell in reach and all needs met.   Brown Husband  Mobility Specialist Please contact via [employer] office at [phone]

## 2023-04-19 NOTE — Consult Note (Addendum)
 NEUROLOGY CONSULT NOTE   Date of service: April 19, 2023 Patient Name: John [name] MRN:  [id number] DOB:  1946-02-18 Admit date: 04/16/2023  Chief Complaint: Dizziness Requesting Provider: Royal Sill, MD  History of Present Illness  John [name] is a 78 y.o. male  has a past medical history of BPH (benign prostatic hyperplasia), CTS (carpal tunnel syndrome), Depression, Diabetes mellitus, Essential hypertension, GERD (gastroesophageal reflux disease), History of breast cancer (T2 M0 invasive carcinoma right breast diagnosed 04/2018 + neoadjuvant systemic chemo docetaxel/carboplatin/trastuzumab/epratuzumab + modified radical mastectomy 04/2018 on tamoxifen  since April 2020), Hypercholesteremia, and Neuropathy.    Per primary team notes, previously evaluated by ENT for vertigo in July 2024 for which vestibular rehabilitation was recommended.  MRI brain 11/08/2022 was negative for acute intracranial process  He had been complaining of increased shortness of breath on recent PCP visit, subsequently on 1/4 presented with dry heaving that started at 4:50 AM with inability to tolerate p.o. and inability to ambulate -- noted several similar episodes over the past year so presentation delayed.  He had labs notable for elevated blood glucose of 407 and anion gap of 19 with a slightly elevated beta hydroxybutyrate level of 1.85  --improved with IV fluids and did not require insulin  drip  PT evaluation on 1/5 was notable for right beating nystagmus with the right rotary component, greatly exacerbated with right Dix-Hallpike testing concerning for right posterior canal BPPV Pt and chart reports hx of vertigo in August 2024, he attended x4 OPPT sessions to try to address it but they told me there wasn't anything else they could do and they were waisting my time  However on PT evaluation 1/6 he was noted have direction changing nystagmus and ataxia with ambulation  MRI was delayed as patient  refused this imaging initially, when this was obtained on 1/7 and revealed acute stroke neurology was consulted  LKW: 1/4 4:30 AM on going to bathroom, walked normally Modified rankin score: 0 IV Thrombolysis: No, out of the window EVT: No, out of the window   NIHSS components Score: Comment  1a Level of Conscious 0[x]  1[]  2[]  3[]      1b LOC Questions 0[x]  1[]  2[]       1c LOC Commands 0[x]  1[]  2[]       2 Best Gaze 0[x]  1[]  2[]       3 Visual 0[x]  1[]  2[]  3[]      4 Facial Palsy 0[x]  1[]  2[]  3[]      5a Motor Arm - left 0[x]  1[]  2[]  3[]  4[]  UN[]    5b Motor Arm - Right 0[x]  1[]  2[]  3[]  4[]  UN[]    6a Motor Leg - Left 0[x]  1[]  2[]  3[]  4[]  UN[]    6b Motor Leg - Right 0[x]  1[]  2[]  3[]  4[]  UN[]    7 Limb Ataxia 0[]  1[]  2[x]  3[]  UN[]    Bilateral upper extremities L >  R  8 Sensory 0[x]  1[]  2[]  UN[]      9 Best Language 0[x]  1[]  2[]  3[]      10 Dysarthria 0[x]  1[]  2[]  UN[]      11 Extinct. and Inattention 0[x]  1[]  2[]       TOTAL:       ROS  Comprehensive ROS performed and pertinent positives documented in HPI   Past History   Past Medical History:  Diagnosis Date   BPH (benign prostatic hyperplasia)    CTS (carpal tunnel syndrome)    Depression    Diabetes mellitus    Essential hypertension    GERD (gastroesophageal reflux  disease)    History of breast cancer    Hypercholesteremia    Neuropathy     Past Surgical History:  Procedure Laterality Date   CATARACT EXTRACTION, BILATERAL  01/18/2017   LITHOTRIPSY     MASTECTOMY Right 05/02/2018   TONSILLECTOMY AND ADENOIDECTOMY      Family History: Family History  Problem Relation Age of Onset   Lymphoma Mother    Cancer Mother    Diabetes Father    Stroke Father    Ovarian cancer Sister    Cancer Sister    Cancer Paternal Grandmother     Social History  reports that he has never smoked. He has never used smokeless tobacco. He reports that he does not drink alcohol and does not use drugs.  Allergies  Allergen Reactions    Ramipril Anaphylaxis   Other Diarrhea    Severe intolerance to Chemotherapy in the past.   Sertraline Other (See Comments)    Extreme headaches   Adhesive [Tape] Rash    Medications   Current Facility-Administered Medications:    acetaminophen  (TYLENOL ) tablet 650 mg, 650 mg, Oral, Q6H PRN, 650 mg at 04/18/23 2331 **OR** acetaminophen  (TYLENOL ) suppository 650 mg, 650 mg, Rectal, Q6H PRN, Sundil, Subrina, MD   amLODipine  (NORVASC ) tablet 5 mg, 5 mg, Oral, Daily, Sundil, Subrina, MD, 5 mg at 04/19/23 9095   aspirin  EC tablet 325 mg, 325 mg, Oral, Daily, Sundil, Subrina, MD, 325 mg at 04/19/23 9095   atorvastatin  (LIPITOR ) tablet 40 mg, 40 mg, Oral, Daily, Sundil, Subrina, MD, 40 mg at 04/19/23 9095   finasteride  (PROSCAR ) tablet 5 mg, 5 mg, Oral, Daily, Sundil, Subrina, MD, 5 mg at 04/19/23 9095   hydrALAZINE  (APRESOLINE ) injection 10 mg, 10 mg, Intravenous, Q6H PRN, Sundil, Subrina, MD, 10 mg at 04/18/23 0409   [START ON 04/20/2023] insulin  aspart (novoLOG ) injection 0-9 Units, 0-9 Units, Subcutaneous, TID WC, Samtani, Jai-Gurmukh, MD   insulin  aspart protamine- aspart (NOVOLOG  MIX 70/30) injection 15 Units, 15 Units, Subcutaneous, BID WC, Samtani, Jai-Gurmukh, MD, 15 Units at 04/19/23 1859   meclizine  (ANTIVERT ) tablet 12.5 mg, 12.5 mg, Oral, TID PRN, Sundil, Subrina, MD, 12.5 mg at 04/19/23 2041   ondansetron  (ZOFRAN ) injection 4 mg, 4 mg, Intravenous, Q6H PRN, Sundil, Subrina, MD   pantoprazole  (PROTONIX ) EC tablet 40 mg, 40 mg, Oral, Daily, Sundil, Subrina, MD, 40 mg at 04/19/23 9095   potassium chloride  SA (KLOR-CON  M) CR tablet 40 mEq, 40 mEq, Oral, Daily, Samtani, Jai-Gurmukh, MD, 40 mEq at 04/19/23 0904   sodium chloride  flush (NS) 0.9 % injection 3 mL, 3 mL, Intravenous, Q12H, Sundil, Subrina, MD, 3 mL at 04/19/23 2020   sodium chloride  flush (NS) 0.9 % injection 3 mL, 3 mL, Intravenous, PRN, Sundil, Subrina, MD   tamsulosin  (FLOMAX ) capsule 0.4 mg, 0.4 mg, Oral, Daily, Sundil,  Subrina, MD, 0.4 mg at 04/19/23 0904  Vitals   Vitals:   04/19/23 0807 04/19/23 1218 04/19/23 1705 04/19/23 2106  BP: (!) 175/87 117/60 137/88 (!) 142/65  Pulse: 80 69  92  Resp: 19 (!) 26  18  Temp: 98.2 F (36.8 C) 98 F (36.7 C)  97.7 F (36.5 C)  TempSrc: Oral Oral  Oral  SpO2: 98% 96%  97%  Weight:      Height:        Body mass index is 30.25 kg/m.  Physical Exam   Constitutional: Appears well-developed and well-nourished. Cushingoid body habitus Psych: Affect appropriate to situation, pleasant, cooperative, tangential Eyes: No scleral  injection.  HENT: No OP obstruction. Thick neck Head: Normocephalic.  Cardiovascular: Normal rate and regular rhythm.  Respiratory: Effort normal, non-labored breathing.  GI: Soft.  No distension. There is no tenderness.  Skin: WDI.   Neurologic Examination   Neuro: Mental Status: Patient is awake, alert, oriented to person, place, month, year, and situation. Patient is able to give a clear and coherent history. No signs of aphasia or neglect Cranial Nerves: II: Visual Fields are full. Pupils are equal, round, and reactive to light.   III,IV, VI: EOMI without ptosis or diploplia. Direction changing nystagmus most notable on right gaze and upgaze on my eval today, minimal in left gaze V: Facial sensation is symmetric to temperature VII: Facial movement is symmetric.  VIII: hearing is intact to voice X: Uvula elevates symmetrically XI: Shoulder shrug is symmetric. XII: tongue is midline without atrophy or fasciculations.  Motor: Tone is normal. Bulk is normal. 5/5 strength was present in all four extremities.  Sensory: Sensation is symmetric to light touch and temperature in the arms and legs. Length dependent loss of temperature Deep Tendon Reflexes: 2+ and symmetric in the brachioradialis and 1+ symmetric patellae.  Cerebellar: FNF and HKS are intact bilaterally  Labs/Imaging/Neurodiagnostic studies    Basic Metabolic  Panel: Recent Labs  Lab 04/17/23 0201 04/17/23 0737 04/18/23 0243 04/19/23 0332 04/20/23 0402  NA 137 140 139 137 134*  K 4.5 4.0 3.5 3.0* 3.4*  CL 100 102 106 103 104  CO2 22 25 23 23 22   GLUCOSE 310* 271* 158* 156* 239*  BUN 22 20 17 18 18   CREATININE 1.20 1.08 0.99 1.02 1.22  CALCIUM  8.7* 9.0 8.9 9.0 8.8*    CBC: Recent Labs  Lab 04/16/23 1448 04/16/23 1555 04/17/23 0737 04/18/23 0243 04/19/23 0332 04/20/23 0402  WBC 9.3  --  11.6* 11.7* 8.2 7.7  HGB 12.7* 12.9* 11.7* 12.0* 12.0* 11.9*  HCT 39.4 38.0* 35.9* 37.3* 37.1* 36.9*  MCV 83.3  --  83.1 85.2 83.4 83.9  PLT 311  --  285 262 253 235    Coagulation Studies: No results for input(s): LABPROT, INR in the last 72 hours.    Lipid Panel:  Lab Results  Component Value Date   CHOL 172 04/11/2023   HDL 27.70 (L) 04/11/2023   LDLDIRECT 78.0 04/11/2023   TRIG (H) 04/11/2023    511.0 Triglyceride is over 400; calculations on Lipids are invalid.   CHOLHDL 6 04/11/2023   HgbA1c:  Lab Results  Component Value Date   HGBA1C 12.4 (H) 04/11/2023   Urine Drug Screen: No results found for: LABOPIA, COCAINSCRNUR, LABBENZ, AMPHETMU, THCU, LABBARB  Alcohol Level     Component Value Date/Time   ETH <10 11/08/2022 2212   INR  Lab Results  Component Value Date   INR 1.0 11/08/2022   APTT No results found for: APTT AED levels: No results found for: PHENYTOIN, ZONISAMIDE, LAMOTRIGINE, LEVETIRACETA  CT Head without contrast(Personally reviewed): 1. No evidence of acute intracranial abnormality. 2. Moderate chronic small vessel ischemic disease.  CT angio Head and Neck with contrast(pending):   MRI Brain(Personally reviewed): Small acute infarct in the right middle cerebellar peduncle.   ASSESSMENT   John [name] is a 79 y.o. male  has a past medical history of BPH (benign prostatic hyperplasia), CTS (carpal tunnel syndrome), Depression, Diabetes mellitus, Essential hypertension,  GERD (gastroesophageal reflux disease), History of breast cancer, Hypercholesteremia, and Neuropathy.   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 given risk factors and location  - Stroke labs LDL near goal < 70 at 78, J8r not meeting goal < 7 at 12.4% - CTA pending - Frequent neuro checks - Echocardiogram - Home ASA 325 mg daily reduce to 81 mg daily at least while on Plavix  - Plavix  300 mg load with 75 mg daily for 21 - 90 day course, course TBD pending CTA results - Increase atorvastatin  to 80 mg - Risk factor modification; discussed with patient who will find a different favorite drink rather than Pepsi - Telemetry monitoring; 30 day event monitor on discharge if no arrythmias captured  - Blood pressure goal   - Normotension (out of the window for permissive hypertension) - PT consult, OT consult, unless patient is back to baseline - Stroke team to follow ______________________________________________________________________  Lola Jernigan MD-PhD [facility] [phone]  Available 7 PM to 7 AM, outside of these hours please call Neurologist on call as listed on Amion.

## 2023-04-19 NOTE — Progress Notes (Signed)
 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 Gar [place] Worked up previously by ENT Dr. Soldatova for vertigo July 2024-went to vestibular rehab did not tolerate Dix-Hallpike Epley--still pending objective vestibular testing-was supposed to have hearing test and MRI-MRI 11/08/2022 showed multiple chronic small vessel infarcts and deep gray nuclei and chronic small vessel ischemia   12/30 recent PCP visit SOB with doing stairs-was worked up with EKG which seemed negative 1/4 M[facility]P-- vertigo with nausea but no vomit-inability to tolerate p.o. unable to move He appeared to be in mild DKA and was unclear about what medications he was supposed to take-he never needed insulin  drip Sodium 135 BUN/creatinine 29/1.2 glucose 393 CT head no evidence of acute intracranial abnormality moderate small vessel disease.   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 meds  mainly----needing additional supplemental 3-5 units so increase 70/30 to 15 bid, and cut back ssi to sensitive to avoid hypoglycemia add back metformin  1000 twice daily Not on any meds for neuropathy BPH Continue Flomax  0.4, finasteride  5 mg HTN Continue amlodipine  5 Lasix  80 held May need to hold in the outpatient setting Male breast cancer Outpatient follow-up with his regular oncologist in [place]-can resume tamoxifen  20 at home   Discussed with wife at bedside  DVT prophylaxis: SCD  Status is: Observation The patient remains OBS appropriate and will d/c before 2 midnights.      Subjective:  More coherent Sat up in chai for 30 min but felt woozy More stable but no anywhere near his baseline--lives with wife who cannot manage him if he falls  Objective + exam Vitals:   04/18/23 2328 04/19/23 0334 04/19/23 0807 04/19/23 1218  BP: (!) 156/80 (!) 169/83 (!) 175/87 117/60  Pulse: 88 79 80 69  Resp: 20 20 19  (!) 26  Temp: 98.2 F (36.8 C) 98 F (36.7 C) 98.2 F (36.8 C) 98 F (36.7 C)  TempSrc: Oral Oral Oral Oral  SpO2: 94% 96% 98% 96%  Weight:  104 kg    Height:       Filed Weights   04/17/23 0210 04/18/23 0355 04/19/23 0334  Weight: 107.5 kg 107.9 kg 104 kg    Examination: Very pleasant no distress--no ict pallor Chest clear no wheeze S1-S2 no murmur PVC  + some SVT on monitors Abdomen soft no rebound Power 5/5  Data Reviewed: reviewed   CBC    Component Value Date/Time   WBC 8.2 04/19/2023 0332   RBC 4.45 04/19/2023 0332  HGB 12.0 (L) 04/19/2023 0332   HCT 37.1 (L) 04/19/2023 0332   PLT 253 04/19/2023 0332   MCV 83.4 04/19/2023 0332   MCH 27.0 04/19/2023 0332   MCHC 32.3 04/19/2023 0332   RDW 15.2 04/19/2023 0332   LYMPHSABS 1.4 11/08/2022 2120   MONOABS 0.5 11/08/2022 2120   EOSABS 0.1 11/08/2022 2120   BASOSABS 0.1 11/08/2022 2120      Latest Ref Rng & Units 04/19/2023    3:32 AM 04/18/2023    2:43 AM 04/17/2023    7:37 AM  CMP  Glucose  70 - 99 mg/dL 843  841  728   BUN 8 - 23 mg/dL 18  17  20    Creatinine 0.61 - 1.24 mg/dL 8.97  9.00  8.91   Sodium 135 - 145 mmol/L 137  139  140   Potassium 3.5 - 5.1 mmol/L 3.0  3.5  4.0   Chloride 98 - 111 mmol/L 103  106  102   CO2 22 - 32 mmol/L 23  23  25    Calcium  8.9 - 10.3 mg/dL 9.0  8.9  9.0   Total Protein 6.5 - 8.1 g/dL 5.5  5.7  5.7   Total Bilirubin 0.0 - 1.2 mg/dL 1.3  1.0  1.0   Alkaline Phos 38 - 126 U/L 40  40  35   AST 15 - 41 U/L 39  36  23   ALT 0 - 44 U/L 28  25  25      Scheduled Meds:  amLODipine   5 mg Oral Daily   aspirin  EC  325 mg Oral Daily   atorvastatin   40 mg Oral Daily   finasteride   5 mg Oral Daily   insulin  aspart  0-15 Units Subcutaneous Q4H   insulin  aspart protamine- aspart  10 Units Subcutaneous BID WC   pantoprazole   40 mg Oral Daily   potassium chloride   40 mEq Oral Daily   sodium chloride  flush  3 mL Intravenous Q12H   tamsulosin   0.4 mg Oral Daily   Continuous Infusions:  Time  34  Jai-Gurmukh Vung Kush, MD  [facility]

## 2023-04-19 NOTE — NC FL2 (Signed)
 [place]  MEDICAID FL2 LEVEL OF CARE FORM     IDENTIFICATION  Patient Name: John [name] Birthdate: Jan 28, 1946 Sex: male Admission Date (Current Location): 04/16/2023  [facility] and [place] Number:  Producer, Television/film/video and Address:  [facility], 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) 04/17/2023   DKA (diabetic ketoacidosis) (HCC) 04/16/2023   Type 2 diabetes mellitus with hyperglycemia, with long-term current use of insulin  (HCC) 07/26/2022   Bronchitis 03/20/2021   Acute non-recurrent frontal sinusitis 03/20/2021   Viral URI with cough 01/13/2021   Hyperlipidemia 11/21/2020   Chronic vertigo 10/20/2020   Localized swelling of both lower extremities 10/20/2020   Tendinitis of right hip flexor 10/20/2020   Aortic atherosclerosis (HCC) 09/10/2020   Insomnia 09/10/2020   Cognitive impairment 08/18/2020   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   Increasing residual urine 06/04/2020   History of COVID-19 12/10/2019   Physical deconditioning 12/10/2019   Fatigue 12/10/2019   Unsteady gait 12/10/2019   Pneumonia due to COVID-19 virus 10/26/2019   Peripheral neuropathy 10/17/2019   Essential hypertension 10/17/2019   Depression, major, single episode, 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/2019   History of breast cancer    GERD (gastroesophageal reflux disease)    BPH (benign prostatic hyperplasia)    CTS (carpal tunnel syndrome)    Diabetic polyneuropathy associated with type 2 diabetes mellitus (HCC)  09/29/2019   Neuropathic pain of foot 09/29/2019   Port-A-Cath in place 01/03/2019   Peripheral sensory neuropathy 05/17/2018   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   Hypomagnesemia 01/18/2018   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) 12/13/2017   Axillary adenopathy 12/09/2017   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   S/P cataract extraction and insertion of intraocular lens 01/17/2017   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   PVD (posterior vitreous detachment), left 01/07/2017   Hyperopia of both eyes with astigmatism 11/09/2016   Class 1 obesity due to excess calories without serious comorbidity with body mass index (BMI) of 31.0 to 31.9 in adult 09/17/2016   Encounter for monitoring tamoxifen  therapy 03/05/2016   Insulin  dependent type 2 diabetes mellitus (HCC) 07/02/2015   Diabetes 1.5, 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   Palpitation 09/16/2014    Orientation RESPIRATION BLADDER Height & Weight     Self, Time, Situation, Place  Normal External catheter, Incontinent Weight: 229 lb 4.5 oz (104 kg) Height:  6' 1 (185.4 cm)  BEHAVIORAL SYMPTOMS/MOOD NEUROLOGICAL BOWEL NUTRITION STATUS      Continent Diet (please see discharge summary)  AMBULATORY STATUS COMMUNICATION OF NEEDS Skin   Limited Assist Verbally Normal                       Personal Care Assistance Level of Assistance  Bathing, Feeding, Dressing Bathing Assistance: Limited assistance Feeding assistance: Independent Dressing Assistance: Limited assistance     Functional Limitations Info  Sight, Hearing, Speech Sight Info: Adequate (glasses) Hearing  Info: Impaired Speech Info: Adequate    SPECIAL CARE FACTORS FREQUENCY  PT (By licensed PT), OT (By licensed OT)     PT Frequency: 5x per week OT Frequency: 5x per week            Contractures Contractures Info: Not present    Additional Factors Info  Code Status,  Allergies Code Status Info: FULL Allergies Info: Ramipril,Sertraline,Adhesive tape           Current Medications (04/19/2023):  This is the current hospital active medication list Current Facility-Administered Medications  Medication Dose Route Frequency Provider Last Rate Last Admin   acetaminophen  (TYLENOL ) tablet 650 mg  650 mg Oral Q6H PRN Sundil, Subrina, MD   650 mg at 04/18/23 2331   Or   acetaminophen  (TYLENOL ) suppository 650 mg  650 mg Rectal Q6H PRN Sundil, Subrina, MD       amLODipine  (NORVASC ) tablet 5 mg  5 mg Oral Daily Sundil, Subrina, MD   5 mg at 04/19/23 9095   aspirin  EC tablet 325 mg  325 mg Oral Daily Sundil, Subrina, MD   325 mg at 04/19/23 9095   atorvastatin  (LIPITOR ) tablet 40 mg  40 mg Oral Daily Sundil, Subrina, MD   40 mg at 04/19/23 9095   finasteride  (PROSCAR ) tablet 5 mg  5 mg Oral Daily Sundil, Subrina, MD   5 mg at 04/19/23 9095   hydrALAZINE  (APRESOLINE ) injection 10 mg  10 mg Intravenous Q6H PRN Sundil, Subrina, MD   10 mg at 04/18/23 0409   [START ON 04/20/2023] insulin  aspart (novoLOG ) injection 0-9 Units  0-9 Units Subcutaneous TID WC Samtani, Jai-Gurmukh, MD       insulin  aspart protamine- aspart (NOVOLOG  MIX 70/30) injection 15 Units  15 Units Subcutaneous BID WC Samtani, Jai-Gurmukh, MD       meclizine  (ANTIVERT ) tablet 12.5 mg  12.5 mg Oral TID PRN Sundil, Subrina, MD   12.5 mg at 04/19/23 9095   ondansetron  (ZOFRAN ) injection 4 mg  4 mg Intravenous Q6H PRN Sundil, Subrina, MD       pantoprazole  (PROTONIX ) EC tablet 40 mg  40 mg Oral Daily Sundil, Subrina, MD   40 mg at 04/19/23 9095   potassium chloride  SA (KLOR-CON  M) CR tablet 40 mEq  40 mEq Oral Daily Samtani, Jai-Gurmukh, MD   40 mEq at 04/19/23 9095   sodium chloride  flush (NS) 0.9 % injection 3 mL  3 mL Intravenous Q12H Sundil, Subrina, MD   3 mL at 04/19/23 9094   sodium chloride  flush (NS) 0.9 % injection 3 mL  3 mL Intravenous PRN Sundil, Subrina, MD       tamsulosin  (FLOMAX ) capsule 0.4 mg   0.4 mg Oral Daily Sundil, Subrina, MD   0.4 mg at 04/19/23 9095     Discharge Medications: Please see discharge summary for a list of discharge medications.  Relevant Imaging Results:  Relevant Lab Results:   Additional Information SSN [id number]  Montie LOISE Louder, LCSW

## 2023-04-19 NOTE — Significant Event (Signed)
 Radiologist called me with MRI brain results -   IMPRESSION: Small acute infarct in the right middle cerebellar peduncle.  Reviewed patient's chart labs and notes.  Patient had presented 2 days ago with DKA and vertigo.  I discussed with on-call neurologist Dr. Jerrie who will be seeing patient in consult.  Redia Cleaver.

## 2023-04-19 NOTE — Care Management Obs Status (Addendum)
 MEDICARE OBSERVATION STATUS NOTIFICATION   Patient Details  Name: John [name] MRN: [id number] Date of Birth: 1945-12-28   Medicare Observation Status Notification Given:  Yes  Notice reviewed with pt and wife at bedside- wife signed on pt's behalf and printed name- copy provided.   [name], RN 04/19/2023, 4:27 PM

## 2023-04-20 ENCOUNTER — Observation Stay (HOSPITAL_COMMUNITY): Payer: Medicare Other

## 2023-04-20 DIAGNOSIS — K5900 Constipation, unspecified: Secondary | ICD-10-CM

## 2023-04-20 DIAGNOSIS — B9781 Human metapneumovirus as the cause of diseases classified elsewhere: Secondary | ICD-10-CM | POA: Diagnosis not present

## 2023-04-20 DIAGNOSIS — H5509 Other forms of nystagmus: Secondary | ICD-10-CM | POA: Diagnosis present

## 2023-04-20 DIAGNOSIS — I63511 Cerebral infarction due to unspecified occlusion or stenosis of right middle cerebral artery: Secondary | ICD-10-CM | POA: Diagnosis not present

## 2023-04-20 DIAGNOSIS — R7989 Other specified abnormal findings of blood chemistry: Secondary | ICD-10-CM | POA: Diagnosis not present

## 2023-04-20 DIAGNOSIS — Z833 Family history of diabetes mellitus: Secondary | ICD-10-CM | POA: Diagnosis not present

## 2023-04-20 DIAGNOSIS — I6302 Cerebral infarction due to thrombosis of basilar artery: Secondary | ICD-10-CM | POA: Diagnosis not present

## 2023-04-20 DIAGNOSIS — E860 Dehydration: Secondary | ICD-10-CM | POA: Diagnosis present

## 2023-04-20 DIAGNOSIS — Z7984 Long term (current) use of oral hypoglycemic drugs: Secondary | ICD-10-CM | POA: Diagnosis not present

## 2023-04-20 DIAGNOSIS — G6289 Other specified polyneuropathies: Secondary | ICD-10-CM

## 2023-04-20 DIAGNOSIS — I6523 Occlusion and stenosis of bilateral carotid arteries: Secondary | ICD-10-CM | POA: Diagnosis not present

## 2023-04-20 DIAGNOSIS — L89301 Pressure ulcer of unspecified buttock, stage 1: Secondary | ICD-10-CM | POA: Diagnosis not present

## 2023-04-20 DIAGNOSIS — Z79810 Long term (current) use of selective estrogen receptor modulators (SERMs): Secondary | ICD-10-CM | POA: Diagnosis not present

## 2023-04-20 DIAGNOSIS — Z794 Long term (current) use of insulin: Secondary | ICD-10-CM | POA: Diagnosis not present

## 2023-04-20 DIAGNOSIS — N400 Enlarged prostate without lower urinary tract symptoms: Secondary | ICD-10-CM | POA: Diagnosis present

## 2023-04-20 DIAGNOSIS — N1831 Chronic kidney disease, stage 3a: Secondary | ICD-10-CM | POA: Diagnosis present

## 2023-04-20 DIAGNOSIS — R278 Other lack of coordination: Secondary | ICD-10-CM | POA: Diagnosis present

## 2023-04-20 DIAGNOSIS — E1169 Type 2 diabetes mellitus with other specified complication: Secondary | ICD-10-CM | POA: Diagnosis not present

## 2023-04-20 DIAGNOSIS — Z9221 Personal history of antineoplastic chemotherapy: Secondary | ICD-10-CM | POA: Diagnosis not present

## 2023-04-20 DIAGNOSIS — N189 Chronic kidney disease, unspecified: Secondary | ICD-10-CM | POA: Diagnosis not present

## 2023-04-20 DIAGNOSIS — Z853 Personal history of malignant neoplasm of breast: Secondary | ICD-10-CM | POA: Diagnosis not present

## 2023-04-20 DIAGNOSIS — R052 Subacute cough: Secondary | ICD-10-CM | POA: Diagnosis not present

## 2023-04-20 DIAGNOSIS — E1122 Type 2 diabetes mellitus with diabetic chronic kidney disease: Secondary | ICD-10-CM | POA: Diagnosis present

## 2023-04-20 DIAGNOSIS — E871 Hypo-osmolality and hyponatremia: Secondary | ICD-10-CM | POA: Diagnosis present

## 2023-04-20 DIAGNOSIS — K219 Gastro-esophageal reflux disease without esophagitis: Secondary | ICD-10-CM | POA: Diagnosis present

## 2023-04-20 DIAGNOSIS — E1142 Type 2 diabetes mellitus with diabetic polyneuropathy: Secondary | ICD-10-CM | POA: Diagnosis present

## 2023-04-20 DIAGNOSIS — F54 Psychological and behavioral factors associated with disorders or diseases classified elsewhere: Secondary | ICD-10-CM | POA: Diagnosis not present

## 2023-04-20 DIAGNOSIS — I129 Hypertensive chronic kidney disease with stage 1 through stage 4 chronic kidney disease, or unspecified chronic kidney disease: Secondary | ICD-10-CM | POA: Diagnosis present

## 2023-04-20 DIAGNOSIS — N179 Acute kidney failure, unspecified: Secondary | ICD-10-CM | POA: Diagnosis present

## 2023-04-20 DIAGNOSIS — E1110 Type 2 diabetes mellitus with ketoacidosis without coma: Secondary | ICD-10-CM | POA: Diagnosis present

## 2023-04-20 DIAGNOSIS — R42 Dizziness and giddiness: Secondary | ICD-10-CM | POA: Diagnosis present

## 2023-04-20 DIAGNOSIS — I63541 Cerebral infarction due to unspecified occlusion or stenosis of right cerebellar artery: Secondary | ICD-10-CM

## 2023-04-20 DIAGNOSIS — I6389 Other cerebral infarction: Secondary | ICD-10-CM | POA: Diagnosis present

## 2023-04-20 DIAGNOSIS — E7800 Pure hypercholesterolemia, unspecified: Secondary | ICD-10-CM | POA: Diagnosis present

## 2023-04-20 DIAGNOSIS — R739 Hyperglycemia, unspecified: Secondary | ICD-10-CM | POA: Diagnosis not present

## 2023-04-20 DIAGNOSIS — I6621 Occlusion and stenosis of right posterior cerebral artery: Secondary | ICD-10-CM | POA: Diagnosis not present

## 2023-04-20 DIAGNOSIS — E119 Type 2 diabetes mellitus without complications: Secondary | ICD-10-CM | POA: Diagnosis not present

## 2023-04-20 DIAGNOSIS — I69398 Other sequelae of cerebral infarction: Secondary | ICD-10-CM | POA: Diagnosis not present

## 2023-04-20 DIAGNOSIS — K5901 Slow transit constipation: Secondary | ICD-10-CM | POA: Diagnosis not present

## 2023-04-20 DIAGNOSIS — N401 Enlarged prostate with lower urinary tract symptoms: Secondary | ICD-10-CM

## 2023-04-20 DIAGNOSIS — E876 Hypokalemia: Secondary | ICD-10-CM | POA: Diagnosis not present

## 2023-04-20 DIAGNOSIS — R5383 Other fatigue: Secondary | ICD-10-CM | POA: Diagnosis not present

## 2023-04-20 DIAGNOSIS — I6502 Occlusion and stenosis of left vertebral artery: Secondary | ICD-10-CM | POA: Diagnosis not present

## 2023-04-20 DIAGNOSIS — I10 Essential (primary) hypertension: Secondary | ICD-10-CM | POA: Diagnosis not present

## 2023-04-20 DIAGNOSIS — I6782 Cerebral ischemia: Secondary | ICD-10-CM | POA: Diagnosis not present

## 2023-04-20 DIAGNOSIS — Z79899 Other long term (current) drug therapy: Secondary | ICD-10-CM | POA: Diagnosis not present

## 2023-04-20 DIAGNOSIS — R29702 NIHSS score 2: Secondary | ICD-10-CM | POA: Diagnosis present

## 2023-04-20 DIAGNOSIS — Z91048 Other nonmedicinal substance allergy status: Secondary | ICD-10-CM | POA: Diagnosis not present

## 2023-04-20 LAB — CBC
HCT: 36.9 % — ABNORMAL LOW (ref 39.0–52.0)
Hemoglobin: 11.9 g/dL — ABNORMAL LOW (ref 13.0–17.0)
MCH: 27 pg (ref 26.0–34.0)
MCHC: 32.2 g/dL (ref 30.0–36.0)
MCV: 83.9 fL (ref 80.0–100.0)
Platelets: 235 10*3/uL (ref 150–400)
RBC: 4.4 MIL/uL (ref 4.22–5.81)
RDW: 14.9 % (ref 11.5–15.5)
WBC: 7.7 10*3/uL (ref 4.0–10.5)
nRBC: 0 % (ref 0.0–0.2)

## 2023-04-20 LAB — COMPREHENSIVE METABOLIC PANEL
ALT: 36 U/L (ref 0–44)
AST: 40 U/L (ref 15–41)
Albumin: 2.8 g/dL — ABNORMAL LOW (ref 3.5–5.0)
Alkaline Phosphatase: 40 U/L (ref 38–126)
Anion gap: 8 (ref 5–15)
BUN: 18 mg/dL (ref 8–23)
CO2: 22 mmol/L (ref 22–32)
Calcium: 8.8 mg/dL — ABNORMAL LOW (ref 8.9–10.3)
Chloride: 104 mmol/L (ref 98–111)
Creatinine, Ser: 1.22 mg/dL (ref 0.61–1.24)
GFR, Estimated: >60 mL/min (ref >60)
Glucose, Bld: 239 mg/dL — ABNORMAL HIGH (ref 70–99)
Potassium: 3.4 mmol/L — ABNORMAL LOW (ref 3.5–5.1)
Sodium: 134 mmol/L — ABNORMAL LOW (ref 135–145)
Total Bilirubin: 1.2 mg/dL (ref 0.0–1.2)
Total Protein: 5.2 g/dL — ABNORMAL LOW (ref 6.5–8.1)

## 2023-04-20 LAB — GLUCOSE, CAPILLARY
Glucose-Capillary: 256 mg/dL — ABNORMAL HIGH (ref 70–99)
Glucose-Capillary: 330 mg/dL — ABNORMAL HIGH (ref 70–99)
Glucose-Capillary: 253 mg/dL — ABNORMAL HIGH (ref 70–99)
Glucose-Capillary: 242 mg/dL — ABNORMAL HIGH (ref 70–99)

## 2023-04-20 MED ORDER — ATORVASTATIN CALCIUM 80 MG PO TABS
80.0000 mg | ORAL_TABLET | Freq: Every day | ORAL | Status: DC
  Administered 2023-04-20 – 2023-04-21 (×2): 80 mg via ORAL
  Filled 2023-04-20 (×2): qty 1

## 2023-04-20 MED ORDER — CLOPIDOGREL BISULFATE 75 MG PO TABS
300.0000 mg | ORAL_TABLET | Freq: Once | ORAL | Status: AC
  Administered 2023-04-20: 300 mg via ORAL
  Filled 2023-04-20: qty 4

## 2023-04-20 MED ORDER — CLOPIDOGREL BISULFATE 75 MG PO TABS
75.0000 mg | ORAL_TABLET | Freq: Every day | ORAL | Status: DC
  Administered 2023-04-21 – 2023-04-22 (×2): 75 mg via ORAL
  Filled 2023-04-20 (×2): qty 1

## 2023-04-20 MED ORDER — ASPIRIN 81 MG PO TBEC
81.0000 mg | DELAYED_RELEASE_TABLET | Freq: Every day | ORAL | Status: DC
  Administered 2023-04-20 – 2023-04-22 (×3): 81 mg via ORAL
  Filled 2023-04-20 (×3): qty 1

## 2023-04-20 MED ORDER — STROKE: EARLY STAGES OF RECOVERY BOOK
Freq: Once | Status: AC
  Filled 2023-04-20: qty 1

## 2023-04-20 MED ORDER — IOHEXOL 350 MG/ML SOLN
75.0000 mL | Freq: Once | INTRAVENOUS | Status: AC | PRN
  Administered 2023-04-20: 75 mL via INTRAVENOUS

## 2023-04-20 MED ORDER — INSULIN ASPART PROT & ASPART (70-30 MIX) 100 UNIT/ML ~~LOC~~ SUSP
17.0000 [IU] | Freq: Two times a day (BID) | SUBCUTANEOUS | Status: DC
  Administered 2023-04-20 – 2023-04-21 (×2): 17 [IU] via SUBCUTANEOUS

## 2023-04-20 NOTE — Progress Notes (Signed)
 Inpatient Rehab Admissions Coordinator:   Per therapy recommendations,  patient was screened for CIR candidacy by Leita Kleine, MS, CCC-SLP. Pt. Now with acute CVA on MRI.  At this time, Pt. Appears to be a a potential candidate for CIR. I will place   order for rehab consult per protocol for full assessment. Please contact me any with questions.  Leita Kleine, MS, CCC-SLP Rehab Admissions Coordinator  [phone] (celll) [phone] (office)

## 2023-04-20 NOTE — PMR Pre-admission (Signed)
 PMR Admission Coordinator Pre-Admission Assessment  Patient: John [name] is an 78 y.o., male MRN: [id number] DOB: 10/25/1945 Height: 6' 1 (185.4 cm) Weight: 103.3 kg           Insurance Information HMO:     PPO: yes     PCP:      IPA:      80/20:      OTHER:  PRIMARY: Blue Medicare      Policy#: [id number]      Subscriber: pt CM Name: Harlene      Phone#: [phone]     Fax#: [phone] Pre-Cert#: TBD for 7 days 1/10 until 1/17      Employer:  Benefits:  Phone #: [phone]     Name: 1/8 Eff. Date: 04/13/23     Deduct: none      Out of Pocket Max: $5900      Life Max: none  CIR: $335 co pay per day days 1 until 5      SNF: no co pay per day days 1 until 20; $214 co pay per day days 21 until 60; no co pay per day days 61 until 100 Outpatient: $10 per visit     Co-Pay:  Home Health: 100%      Co-Pay:  DME: 80%     Co-Pay: 20% Providers: in network  SECONDARY: none  Financial Counselor:       Phone#:   The Data Processing Manager" for patients in Inpatient Rehabilitation Facilities with attached "Privacy Act Statement-Health Care Records" was provided and verbally reviewed with: Patient and Family  Emergency Contact Information Contact Information     Name Relation Home Work Mobile   [name] Spouse [phone]  [phone]   Slater, Mcmanaman   [phone]      Other Contacts   None on File    Current Medical History  Patient Admitting Diagnosis: CVA  History of Present Illness: 78 year old male with history of chronic vertigo, IDDM type II, essential HTN, HLD, peripheral neuropathy and breast cancer who presented on 04/16/23 with dizziness and nausea. Was unable to tolerate meclizine .   Labs notable with elevated blood glucose of 407 an anion gap of 19 with a slightly elevated beta hydroxybutyrate of 1.85. Improved with IVF and did not require insulin  drip.   Noted on PT evaluation with direction changing nystagmus and ataxia with ambulation.  MRI was delayed as patient refusing imaging initially and obtained on 1/7. 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 ASA prior to admit. Now on ASA and clopidogrel  daily for 3 weeks and then plavix  alone. Amlodopine for HTN. Atorvastatin  at home with LDL 18. Home meds Toujeo  and metformin  with Hgb A1c 12.4. SSI and CBGS and follow up with PCP for better DM control.   Complete NIHSS TOTAL: 0  Patient's medical record from [facility] has been reviewed by the rehabilitation admission coordinator and physician.  Past Medical History  Past Medical History:  Diagnosis Date   BPH (benign prostatic hyperplasia)    CTS (carpal tunnel syndrome)    Depression    Diabetes mellitus    Essential hypertension    GERD (gastroesophageal reflux disease)    History of breast cancer    Hypercholesteremia  Neuropathy    Has the patient had major surgery during 100 days prior to admission? No  Family History  family history includes Cancer in his mother, paternal grandmother, and sister; Diabetes in his father; Lymphoma in his mother; Ovarian cancer in his sister; Stroke in his father.  Current Medications   Current Facility-Administered Medications:    acetaminophen  (TYLENOL ) tablet 650 mg, 650 mg, Oral, Q6H PRN, 650 mg at 04/18/23 2331 **OR** acetaminophen  (TYLENOL ) suppository 650 mg, 650 mg, Rectal, Q6H PRN, Sundil, Subrina, MD   amLODipine  (NORVASC ) tablet 5 mg, 5 mg, Oral, Daily, Sundil, Subrina, MD, 5 mg at 04/22/23 9187   aspirin  EC tablet 81 mg, 81 mg, Oral, Daily, Bhagat, Srishti L, MD, 81 mg at 04/22/23 9187   atorvastatin  (LIPITOR ) tablet 80 mg, 80 mg, Oral, QHS, Bhagat, Srishti L, MD, 80 mg at 04/21/23 2120    [COMPLETED] clopidogrel  (PLAVIX ) tablet 300 mg, 300 mg, Oral, Once, 300 mg at 04/20/23 0858 **FOLLOWED BY** clopidogrel  (PLAVIX ) tablet 75 mg, 75 mg, Oral, Daily, Bhagat, Srishti L, MD, 75 mg at 04/22/23 9187   finasteride  (PROSCAR ) tablet 5 mg, 5 mg, Oral, Daily, Sundil, Subrina, MD, 5 mg at 04/22/23 9187   furosemide  (LASIX ) tablet 40 mg, 40 mg, Oral, Daily, Regalado, Belkys A, MD   hydrALAZINE  (APRESOLINE ) injection 10 mg, 10 mg, Intravenous, Q6H PRN, Sundil, Subrina, MD, 10 mg at 04/18/23 0409   insulin  aspart (novoLOG ) injection 0-9 Units, 0-9 Units, Subcutaneous, TID WC, Samtani, Jai-Gurmukh, MD, 2 Units at 04/22/23 9372   insulin  aspart protamine- aspart (NOVOLOG  MIX 70/30) injection 19 Units, 19 Units, Subcutaneous, BID WC, Regalado, Belkys A, MD, 19 Units at 04/22/23 0810   meclizine  (ANTIVERT ) tablet 12.5 mg, 12.5 mg, Oral, TID PRN, Sundil, Subrina, MD, 12.5 mg at 04/19/23 2041   ondansetron  (ZOFRAN ) injection 4 mg, 4 mg, Intravenous, Q6H PRN, Sundil, Subrina, MD   pantoprazole  (PROTONIX ) EC tablet 40 mg, 40 mg, Oral, Daily, Sundil, Subrina, MD, 40 mg at 04/22/23 9187   polyethylene glycol (MIRALAX  / GLYCOLAX ) packet 17 g, 17 g, Oral, BID, Regalado, Belkys A, MD, 17 g at 04/22/23 9187   potassium chloride  SA (KLOR-CON  M) CR tablet 40 mEq, 40 mEq, Oral, Daily, Samtani, Jai-Gurmukh, MD, 40 mEq at 04/21/23 0948   sodium chloride  flush (NS) 0.9 % injection 3 mL, 3 mL, Intravenous, Q12H, Sundil, Subrina, MD, 3 mL at 04/22/23 9095   sodium chloride  flush (NS) 0.9 % injection 3 mL, 3 mL, Intravenous, PRN, Sundil, Subrina, MD   tamsulosin  (FLOMAX ) capsule 0.4 mg, 0.4 mg, Oral, Daily, Sundil, Subrina, MD, 0.4 mg at 04/22/23 9187   triamcinolone  0.1 % cream : eucerin cream, 1:1, , Topical, BID, Regalado, Belkys A, MD, Given at 04/21/23 1740  Patients Current Diet:  Diet Order             Diet Carb Modified Fluid consistency: Thin; Room service appropriate? Yes  Diet effective now                   Precautions / Restrictions Precautions Precautions: Fall Precaution Comments: R BPPV vs hypofunction vs central cause Restrictions Weight Bearing Restrictions Per Provider Order: No   Has the patient had 2 or more falls or a fall with injury in the past year?Yes  Prior Activity Level Community (5-7x/wk): Independent  Prior Functional Level Prior Function Prior Level of Function : Independent/Modified Independent, Driving, Patient poor historian/Family not available Mobility Comments: No AD ADLs Comments: Ind with ADLs per pt report  Self Care: Did the patient need help bathing, dressing, using the toilet or eating?  Independent  Indoor Mobility: Did the patient need assistance with walking from room to room (with or without device)? Independent  Stairs: Did the patient need assistance with internal or external stairs (with or without device)? Independent  Functional Cognition: Did the patient need help planning regular tasks such as shopping or remembering to take medications? Independent  Patient Information Are you of Hispanic, Latino/a,or Spanish origin?: A. No, not of Hispanic, Latino/a, or Spanish origin What is your race?: A. White Do you need or want an interpreter to communicate with a doctor or health care staff?: 0. No  Patient's Response To:  Health Literacy and Transportation Is the patient able to respond to health literacy and transportation needs?: Yes Health Literacy - How often do you need to have someone help you when you read instructions, pamphlets, or other written material from your doctor or pharmacy?: Never In the past 12 months, has lack of transportation kept you from medical appointments or from getting medications?: No In the past 12 months, has lack of transportation kept you from meetings, work, or from getting things needed for daily living?: No  Home Assistive Devices / Equipment Home Equipment: Agricultural Consultant (2 wheels),  BSC/3in1  Prior Device Use: Indicate devices/aids used by the patient prior to current illness, exacerbation or injury? None of the above  Current Functional Level Cognition  Overall Cognitive Status: Within Functional Limits for tasks assessed Orientation Level: Oriented X4 General Comments: pt able to verbalize that he had a CVA but unable to recall where in his brain or that he had been told this (which he had by last therapist). Reviewed this again and how it correlates to his symptoms    Extremity Assessment (includes Sensation/Coordination)  Upper Extremity Assessment: Overall WFL for tasks assessed  Lower Extremity Assessment: Defer to PT evaluation    ADLs  Overall ADL's : Needs assistance/impaired Eating/Feeding: Independent Grooming: Set up, Sitting Upper Body Bathing: Set up, Supervision/ safety Upper Body Bathing Details (indicate cue type and reason): simulated Lower Body Bathing: Set up, Contact guard assist Lower Body Bathing Details (indicate cue type and reason): simulated Upper Body Dressing : Set up Lower Body Dressing: Set up, Sitting/lateral leans Toilet Transfer: Minimal assistance, Rolling walker (2 wheels) Toileting- Clothing Manipulation and Hygiene: Set up, Sitting/lateral lean Functional mobility during ADLs: Minimal assistance, Rolling walker (2 wheels) General ADL Comments: min A to maintain balance while standing/ambulating due to poor balance. set up for UB/LB ADLs    Mobility  Overal bed mobility: Needs Assistance Bed Mobility: Supine to Sit, Sit to Supine Rolling: Supervision Sidelying to sit: Min assist Supine to sit: Supervision Sit to supine: Supervision Sit to sidelying: Mod assist General bed mobility comments: supervision for safety    Transfers  Overall transfer level: Needs assistance Equipment used: Rolling walker (2 wheels) Transfers: Sit to/from Stand, Bed to chair/wheelchair/BSC Sit to Stand: Min assist Bed to/from  chair/wheelchair/BSC transfer type:: Step pivot Step pivot transfers: Min assist General transfer comment: min A to maintain balance with RW    Ambulation / Gait / Stairs / Wheelchair Mobility  Ambulation/Gait Ambulation/Gait assistance: Mod assist Gait Distance (Feet): 70 Feet Assistive device: Rolling walker (2 wheels) Gait Pattern/deviations: Step-through pattern, Decreased stride length, Staggering right, Narrow base of support General Gait Details: mod A needed due to R lean as well as occasionally stepping out of RW with R foot and kicking RW. Balance very  poor with changing direction and pt has diffciulty problem solving which way to turn. Gait velocity: dec Gait velocity interpretation: <1.31 ft/sec, indicative of household ambulator    Posture / Balance Dynamic Sitting Balance Sitting balance - Comments: EOB ADLs, slight posterior lean but able to self correct Balance Overall balance assessment: Needs assistance Sitting-balance support: No upper extremity supported, Feet supported Sitting balance-Leahy Scale: Fair Sitting balance - Comments: EOB ADLs, slight posterior lean but able to self correct Postural control: Posterior lean Standing balance support: Bilateral upper extremity supported, During functional activity, Reliant on assistive device for balance Standing balance-Leahy Scale: Poor Standing balance comment: frequent LOB, min A to correct with RW High Level Balance Comments: worked on seated balance with pt throwing and catching small bottle. Pt had diffciulty grading his effort as well as coordinating B hands to catch. In standing, pt needed mod A during dynamic activity, worked on reaching in all planes. Pt very hesitant to reach fwd out of BOS.    Special needs/care consideration Hb A1c 12.4 Patient tends to joke around a lot and has to be redirected to stay on task     Previous Home Environment  Living Arrangements: Spouse/significant other  Lives With:  Spouse Available Help at Discharge: Family, Available 24 hours/day Type of Home: House Home Layout: Two level, Bed/bath upstairs, Other (Comment) Home Access: Stairs to enter Entrance Stairs-Rails: Right Entrance Stairs-Number of Steps: 3 Bathroom Shower/Tub: Health Visitor: Handicapped height Home Care Services: No Additional Comments: verified with wife  Discharge Living Setting Plans for Discharge Living Setting: Patient's home, Lives with (comment), House Type of Home at Discharge: House Discharge Home Layout: Two level, Bed/bath upstairs Discharge Home Access: Stairs to enter Entrance Stairs-Rails: Right Entrance Stairs-Number of Steps: 3 Discharge Bathroom Shower/Tub: Walk-in shower Discharge Bathroom Toilet: Handicapped height Discharge Bathroom Accessibility: Yes How Accessible: Accessible via walker Does the patient have any problems obtaining your medications?: No  Social/Family/Support Systems Patient Roles: Spouse Contact Information: wife Anticipated Caregiver: wife Anticipated Caregiver's Contact Information: see contacts Ability/Limitations of Caregiver: no limitations Caregiver Availability: 24/7 Discharge Plan Discussed with Primary Caregiver: Yes Is Caregiver In Agreement with Plan?: Yes Does Caregiver/Family have Issues with Lodging/Transportation while Pt is in Rehab?: No  Goals Patient/Family Goal for Rehab: supervision with PT, OT and SLP Expected length of stay: ELOS 8 to 10 days Additional Information: Patient tends to joke around and has to be redirected frequently Pt/Family Agrees to Admission and willing to participate: Yes Program Orientation Provided & Reviewed with Pt/Caregiver Including Roles  & Responsibilities: Yes  Decrease burden of Care through IP rehab admission: n/a  Possible need for SNF placement upon discharge:not anticipated  Patient Condition: This patient's condition remains as documented in the consult dated  04/20/23, in which the Rehabilitation Physician determined and documented that the patient's condition is appropriate for intensive rehabilitative care in an inpatient rehabilitation facility. Will admit to inpatient rehab today.  Preadmission Screen Completed By:  Alison Heron Lot, RN MSN 04/22/2023 10:23 AM ______________________________________________________________________   Discussed status with Dr. Emeline on 04/22/23 at 1024 and received approval for admission today.  Admission Coordinator:  Alison Heron Lot, RN MSN time 1024 Date 04/22/23

## 2023-04-20 NOTE — Plan of Care (Signed)
  Problem: Education: Goal: Knowledge of disease or condition will improve Outcome: Progressing Goal: Knowledge of secondary prevention will improve (MUST DOCUMENT ALL) Outcome: Progressing Goal: Knowledge of patient specific risk factors will improve Loraine Leriche N/A or DELETE if not current risk factor) Outcome: Progressing   Problem: Ischemic Stroke/TIA Tissue Perfusion: Goal: Complications of ischemic stroke/TIA will be minimized Outcome: Progressing

## 2023-04-20 NOTE — Consult Note (Signed)
 Physical Medicine and Rehabilitation Consult Reason for Consult:Rehab Referring Physician: Dr. Franky   HPI: John [name] is a 78 y.o. male with past medical history of chronic vertigo, diabetes mellitus type 2, hypertension, hyperlipidemia, peripheral neuropathy, breast cancer s/p modified radical mastectomy and chemo.  Patient follows with ENT for vertigo.  Followed by ENT for vertigo, tried vestibular rehab but did not tolerate.  Patient was also treated for mild DKA.  CT head without acute changes but did show chronic ischemic small vessel disease.  MRI was delayed by ability to lay flat.  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  CTA head and neck with 60% stenosis proximal left ICA, 50% stenosis left vertebral artery origin, 30% stenosis of both internal carotid artery siphon regions moderate long segment stenosis proximal right PCA. Patient was seen by neurology started on Plavix  and ASA reduced to 81 mg from home 325 mg dose.  Patient was seen by physical therapy and Occupational Therapy and found to have functional deficits.  Patient lives in a two-story home with 3-1/2 steps to enter, 14 steps to get to the second floor.  He can stay on the first floor if needed.  Wife can assist after discharge   Review of Systems  Constitutional:  Negative for chills and fever.  HENT:  Positive for hearing loss (chronic).   Eyes:  Negative for blurred vision and double vision.  Respiratory:  Negative for shortness of breath.   Cardiovascular:  Negative for chest pain.  Gastrointestinal:  Positive for constipation. Negative for abdominal pain, diarrhea, nausea and vomiting.       Occasional incontinence since chemotherapy  Genitourinary:  Positive for urgency.  Musculoskeletal:  Negative for joint pain.  Skin:  Negative for rash.  Neurological:  Positive for dizziness and sensory change (chronic neuropathy). Negative for speech change,  focal weakness and headaches.   Past Medical History:  Diagnosis Date   BPH (benign prostatic hyperplasia)    CTS (carpal tunnel syndrome)    Depression    Diabetes mellitus    Essential hypertension    GERD (gastroesophageal reflux disease)    History of breast cancer    Hypercholesteremia    Neuropathy    Past Surgical History:  Procedure Laterality Date   CATARACT EXTRACTION, BILATERAL  01/18/2017   LITHOTRIPSY     MASTECTOMY Right 05/02/2018   TONSILLECTOMY AND ADENOIDECTOMY     Family History  Problem Relation Age of Onset   Lymphoma Mother    Cancer Mother    Diabetes Father    Stroke Father    Ovarian cancer Sister    Cancer Sister    Cancer Paternal Grandmother    Social History:  reports that he has never smoked. He has never used smokeless tobacco. He reports that he does not drink alcohol and does not use drugs. Allergies:  Allergies  Allergen Reactions   Ramipril Anaphylaxis   Other Diarrhea    Severe intolerance to Chemotherapy in the past.   Sertraline Other (See Comments)    Extreme headaches   Adhesive [Tape] Rash   Medications Prior to Admission  Medication Sig Dispense Refill   acetaminophen  (TYLENOL ) 650 MG CR tablet Take 650-1,300 mg by mouth every 8 (eight) hours as needed for pain.     amLODipine  (NORVASC ) 5 MG tablet TAKE 1 TABLET(5 MG) BY MOUTH DAILY (Patient taking differently: Take 5 mg by mouth daily.) 90 tablet 3  Ascorbic Acid  (VITAMIN C ) 1000 MG tablet Take 1,000 mg by mouth 3 (three) times a week.     aspirin  325 MG EC tablet Take 325 mg by mouth at bedtime.     finasteride  (PROSCAR ) 5 MG tablet Take 5 mg by mouth daily.     furosemide  (LASIX ) 80 MG tablet Take 1 tablet (80 mg total) by mouth daily. 90 tablet 1   insulin  lispro (HUMALOG  KWIKPEN) 200 UNIT/ML KwikPen Inject 25 Units into the skin 3 (three) times daily before meals. (Patient taking differently: Inject 25 Units into the skin in the morning.) 15 mL 2   metFORMIN   (GLUCOPHAGE -XR) 500 MG 24 hr tablet Take 2 tablets (1,000 mg total) by mouth 2 (two) times daily with a meal. 360 tablet 3   methocarbamol  (ROBAXIN ) 500 MG tablet TAKE 1 TABLET(500 MG) BY MOUTH EVERY 8 HOURS AS NEEDED FOR MUSCLE SPASMS (Patient taking differently: Take 500 mg by mouth every 8 (eight) hours as needed for muscle spasms.) 30 tablet 0   Multiple Vitamin (MULTIVITAMIN) tablet Take 1 tablet by mouth 2 (two) times a week.     nystatin  cream (MYCOSTATIN ) Apply 1 Application topically 2 (two) times daily. (Patient taking differently: Apply 1 Application topically 2 (two) times daily as needed (as directed- to affected areas of the penis and anus).) 30 g 0   omeprazole  (PRILOSEC) 40 MG capsule TAKE 1 CAPSULE(40 MG) BY MOUTH DAILY (Patient taking differently: Take 40 mg by mouth daily before breakfast.) 90 capsule 3   tamoxifen  (NOLVADEX ) 20 MG tablet Take 20 mg by mouth daily.     tamsulosin  (FLOMAX ) 0.4 MG CAPS capsule Take 0.4 mg by mouth daily.     TOUJEO  MAX SOLOSTAR 300 UNIT/ML Solostar Pen Take 15 u in the morning and 70 u in the evening. (Patient taking differently: 45 Units See admin instructions. Inject 45 units into the skin before breakfast and supper)     Accu-Chek Softclix Lancets lancets Use daily to check blood sugar.  DX E11.69 100 each 3   atorvastatin  (LIPITOR ) 40 MG tablet Take 1 tablet (40 mg total) by mouth daily. (Patient not taking: Reported on 04/17/2023) 90 tablet 3   Blood Glucose Monitoring Suppl (ACCU-CHEK GUIDE) w/Device KIT Use daily to check blood sugar.  DX E11.69 1 kit 0   Lancets Misc. (ACCU-CHEK SOFTCLIX LANCET DEV) KIT Use daily to check blood sugar.  DX E11.69     meclizine  (ANTIVERT ) 12.5 MG tablet Take 1 tablet (12.5 mg total) by mouth 3 (three) times daily as needed for dizziness. (Patient not taking: Reported on 04/17/2023) 30 tablet 0   zinc  sulfate 220 (50 Zn) MG capsule Take 1 capsule (220 mg total) by mouth daily. (Patient not taking: Reported on 04/17/2023)  30 capsule 1   Zoster Vaccine Adjuvanted (SHINGRIX ) injection Inject into the muscle. (Patient not taking: Reported on 04/17/2023) 0.5 mL 1    Home: Home Living Family/patient expects to be discharged to:: Private residence Living Arrangements: Spouse/significant other Available Help at Discharge: Family, Available 24 hours/day Type of Home: House Home Access: Stairs to enter [employer] of Steps: 3 Entrance Stairs-Rails: Right Home Layout: Two level, Bed/bath upstairs, Other (Comment) Bathroom Shower/Tub: Health Visitor: Handicapped height Home Equipment: Agricultural Consultant (2 wheels), BSC/3in1 Additional Comments: unsure of accuracy as pt has questionable cognition  Functional History: Prior Function Prior Level of Function : Independent/Modified Independent, Driving, Patient poor historian/Family not available Mobility Comments: No AD ADLs Comments: Ind with ADLs per  pt report Functional Status:  Mobility: Bed Mobility Overal bed mobility: Needs Assistance Bed Mobility: Supine to Sit Rolling: Supervision Sidelying to sit: Min assist Supine to sit: Contact guard Sit to supine: Mod assist Sit to sidelying: Mod assist General bed mobility comments: pt able to bring LEs off EOB, use bed rail to pull self up to EOB, HOB elevated, reports he sleeps in a recliner Transfers Overall transfer level: Needs assistance Equipment used: Rolling walker (2 wheels) Transfers: Sit to/from Stand Sit to Stand: Mod assist General transfer comment: modA to power up, max verbal cues for hand placement to push up from bed, pt with posterior bias requiring minA to stabilize, also with R lateral bias as well Ambulation/Gait Ambulation/Gait assistance: Mod assist Gait Distance (Feet): 75 Feet (x2 (required seated rest break)) Assistive device: Rolling walker (2 wheels) Gait Pattern/deviations: Step-through pattern, Decreased stride length, Staggering right, Narrow base of  support General Gait Details: minA progressing to modA. Pt much improved from yesterday with minimal ataxia however hugs the R side of the walker and vears to the Right requiring assist for walker management. With onset of fatigue pt with noted LOB x3 requiring modA to prevent fall, pt with short step length and narrow base of support with onset of fatigue as well Gait velocity: dec Gait velocity interpretation: <1.31 ft/sec, indicative of household ambulator    ADL: ADL Overall ADL's : Needs assistance/impaired Eating/Feeding: Independent Grooming: Wash/dry hands, Wash/dry face, Contact guard assist, Sitting Upper Body Bathing: Contact guard assist, Sitting Upper Body Bathing Details (indicate cue type and reason): simulated Lower Body Bathing: Moderate assistance Lower Body Bathing Details (indicate cue type and reason): simulated Upper Body Dressing : Contact guard assist, Sitting  Cognition: Cognition Overall Cognitive Status: Within Functional Limits for tasks assessed Orientation Level: Oriented X4 Cognition Arousal: Alert Behavior During Therapy: WFL for tasks assessed/performed Overall Cognitive Status: Within Functional Limits for tasks assessed General Comments: pt with understanding of having had a CVA in cerebellum and need for rehab, cooperative, eager to mobilize, able to follow commands. pt HOH causing some delay in processing but overall good cognition  Blood pressure (!) 116/55, pulse 93, temperature 98.2 F (36.8 C), temperature source Oral, resp. rate 18, height 6' 1 (1.854 m), weight 104.4 kg, SpO2 98%. Physical Exam  General: No apparent distress HEENT: Head is normocephalic, atraumatic, sclera anicteric, oral mucosa pink and moist, wearing glasses Neck: Supple without JVD or lymphadenopathy Heart: Reg rate and rhythm. No murmurs rubs or gallops Chest: CTA bilaterally without wheezes, rales, or rhonchi; no distress Abdomen: Soft, non-tender, non-distended,  bowel sounds positive. Extremities: No clubbing, cyanosis, or edema. Pulses are 2+ Psych: Pt's affect is appropriate. Pt is cooperative Skin: Clean and intact without signs of breakdown Neuro:    Mental Status: AAOx3, memory intact, fund of knowledge appropriate Speech/Languate: Naming and repetition intact, fluent, follows simple commands CRANIAL NERVES: II: PERRL. Visual fields full III, IV, VI: EOM intact, no gaze preference or deviation. + Nystagmus with lateral gaze  V: normal sensation bilaterally VII: no asymmetry VIII: normal hearing to speech IX, X: normal palatal elevation XI: 5/5 head turn and 5/5 shoulder shrug bilaterally XII: Tongue midline   MOTOR: RUE: 5/5 Deltoid, 5/5 Biceps, 5/5 Triceps,5/5 Grip LUE: 5/5 Deltoid, 5/5 Biceps, 5/5 Triceps, 5/5 Grip RLE: HF 5/5, KE 5/5, ADF 5/5, APF 5/5 LLE: HF 5/5, KE 5/5, ADF 5/5, APF 5/5   REFLEXES: No ankle clonus SENSORY: Intact in all 4 extremities however decreased in distal feet-chronic  neuropathy related  Coordination: Normal finger to nose and heel to shin, no tremor  2 IVs in right upper extremity  Results for orders placed or performed during the hospital encounter of 04/16/23 (from the past 24 hours)  Glucose, capillary     Status: Abnormal   Collection Time: 04/19/23  5:07 PM  Result Value Ref Range   Glucose-Capillary 350 (H) 70 - 99 mg/dL  Glucose, capillary     Status: Abnormal   Collection Time: 04/19/23  9:03 PM  Result Value Ref Range   Glucose-Capillary 298 (H) 70 - 99 mg/dL  Comprehensive metabolic panel     Status: Abnormal   Collection Time: 04/20/23  4:02 AM  Result Value Ref Range   Sodium 134 (L) 135 - 145 mmol/L   Potassium 3.4 (L) 3.5 - 5.1 mmol/L   Chloride 104 98 - 111 mmol/L   CO2 22 22 - 32 mmol/L   Glucose, Bld 239 (H) 70 - 99 mg/dL   BUN 18 8 - 23 mg/dL   Creatinine, Ser 8.77 0.61 - 1.24 mg/dL   Calcium  8.8 (L) 8.9 - 10.3 mg/dL   Total Protein 5.2 (L) 6.5 - 8.1 g/dL   Albumin 2.8  (L) 3.5 - 5.0 g/dL   AST 40 15 - 41 U/L   ALT 36 0 - 44 U/L   Alkaline Phosphatase 40 38 - 126 U/L   Total Bilirubin 1.2 0.0 - 1.2 mg/dL   GFR, Estimated >39 >39 mL/min   Anion gap 8 5 - 15  CBC     Status: Abnormal   Collection Time: 04/20/23  4:02 AM  Result Value Ref Range   WBC 7.7 4.0 - 10.5 K/uL   RBC 4.40 4.22 - 5.81 MIL/uL   Hemoglobin 11.9 (L) 13.0 - 17.0 g/dL   HCT 63.0 (L) 60.9 - 47.9 %   MCV 83.9 80.0 - 100.0 fL   MCH 27.0 26.0 - 34.0 pg   MCHC 32.2 30.0 - 36.0 g/dL   RDW 85.0 88.4 - 84.4 %   Platelets 235 150 - 400 K/uL   nRBC 0.0 0.0 - 0.2 %  Glucose, capillary     Status: Abnormal   Collection Time: 04/20/23  6:20 AM  Result Value Ref Range   Glucose-Capillary 256 (H) 70 - 99 mg/dL  Glucose, capillary     Status: Abnormal   Collection Time: 04/20/23 12:11 PM  Result Value Ref Range   Glucose-Capillary 330 (H) 70 - 99 mg/dL   Comment 1 Notify RN    Comment 2 Document in Chart    CT ANGIO HEAD NECK W WO CM Result Date: 04/20/2023 CLINICAL DATA:  Stroke/TIA.  Determine embolic source. EXAM: CT ANGIOGRAPHY HEAD AND NECK WITH AND WITHOUT CONTRAST TECHNIQUE: Multidetector CT imaging of the head and neck was performed using the standard protocol during bolus administration of intravenous contrast. Multiplanar CT image reconstructions and MIPs were obtained to evaluate the vascular anatomy. Carotid stenosis measurements (when applicable) are obtained utilizing NASCET criteria, using the distal internal carotid diameter as the denominator. RADIATION DOSE REDUCTION: This exam was performed according to the departmental dose-optimization program which includes automated exposure control, adjustment of the mA and/or kV according to patient size and/or use of iterative reconstruction technique. CONTRAST:  75mL OMNIPAQUE  IOHEXOL  350 MG/ML SOLN COMPARISON:  MRI yesterday. FINDINGS: CT HEAD FINDINGS Brain: Chronic small-vessel ischemic changes affect the pons. Small acute infarction of  the middle cerebellar peduncle on the right cannot be specifically seen by  CT. Cerebral hemispheres show chronic small-vessel ischemic changes of the white matter without evidence of an acute stroke. No mass, hemorrhage, hydrocephalus or extra-axial collection. Vascular: There is atherosclerotic calcification of the major vessels at the base of the brain. Skull: Negative Sinuses/Orbits: Clear/normal Other: None Review of the MIP images confirms the above findings CTA NECK FINDINGS Aortic arch: Aortic atherosclerosis.  Branching pattern is normal. Right carotid system: Common carotid artery widely patent to the bifurcation. Minimal atherosclerotic plaque at the bifurcation and ICA bulb but no stenosis. Cervical ICA widely patent. Left carotid system: Common carotid artery widely patent to the bifurcation. Calcified plaque at the bifurcation and proximal ICA bulb. Minimal diameter of the proximal ICA is 1.75 mm. Compared to a more distal cervical ICA diameter of 4.2 mm, this indicates a 60% stenosis. Vertebral arteries: No proximal subclavian stenosis. Right vertebral artery origin is widely patent. The vessel appears normal through the cervical region to the foramen magnum. There is soft and calcified plaque at the left vertebral artery origin with a 50% stenosis. Beyond that, the vessel is patent through the cervical region to the foramen magnum. Skeleton: Minimal spondylosis. Other neck: No mass or lymphadenopathy. Upper chest: Chronic scarring in the anterior right upper lobe. Review of the MIP images confirms the above findings CTA HEAD FINDINGS Anterior circulation: Both internal carotid arteries are patent through the skull base and siphon regions. There is siphon atherosclerotic calcification with maximal stenosis estimated at 30% on both sides. The anterior and middle cerebral vessels are patent. No large vessel occlusion or flow limiting proximal stenosis. No aneurysm or vascular malformation. Posterior  circulation: Both vertebral arteries are widely patent to the basilar artery. No basilar stenosis. Posterior circulation branch vessels are patent. There is moderate long segment stenosis of the proximal right PCA, but the vessel does show persistent flow. Venous sinuses: Patent and normal. Anatomic variants: None significant. Review of the MIP images confirms the above findings IMPRESSION: 1. No large vessel occlusion or flow limiting proximal intracranial stenosis. 2. 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 3. 50% stenosis of the left vertebral artery origin due to soft and calcified plaque. 4. 30% stenosis of both internal carotid artery siphon regions due to calcified plaque. 5. Moderate long segment stenosis of the proximal right PCA, but the vessel does show persistent flow. 6. Aortic atherosclerosis. 7. Chronic small-vessel ischemic changes of the pons and cerebral hemispheric white matter. Small acute infarction of the middle cerebellar peduncle on the right shown by MRI cannot be specifically seen by CT. Aortic Atherosclerosis (ICD10-I70.0). Electronically Signed   By: Oneil Officer M.D.   On: 04/20/2023 11:22   MR BRAIN W WO CONTRAST Result Date: 04/19/2023 CLINICAL DATA:  Vertigo EXAM: MRI HEAD WITHOUT AND WITH CONTRAST TECHNIQUE: Multiplanar, multiecho pulse sequences of the brain and surrounding structures were obtained without and with intravenous contrast. CONTRAST:  10mL GADAVIST  GADOBUTROL  1 MMOL/ML IV SOLN COMPARISON:  11/08/2022 MRI head, 04/16/2023 CT head FINDINGS: Brain: Small area of restricted diffusion with ADC correlate in the right middle cerebellar peduncle (series 2, image 13 and 14; series 3, image 14). This is associated with a small amount of increased T2 hyperintense signal. No acute hemorrhage, mass, mass effect, or midline shift. No hydrocephalus or extra-axial collection. Pituitary and craniocervical junction within normal limits. Focus of hemosiderin  deposition in the pons, likely sequela prior hypertensive microhemorrhage. Remote lacunar infarcts in the right lentiform nucleus, bilateral thalami, and right pons. Advanced cerebral  atrophy for age. Confluent T2 hyperintense signal in the periventricular white matter, likely the sequela of moderate chronic small vessel ischemic disease. Dilated perivascular spaces in the basal ganglia and cortex. Vascular: Normal arterial flow voids. Skull and upper cervical spine: Normal marrow signal. Sinuses/Orbits: Clear paranasal sinuses. No acute finding in the orbits. Status post bilateral lens replacements. Other: Fluid in the right mastoid air cells. IMPRESSION: Small acute infarct in the right middle cerebellar peduncle. Electronically Signed   By: Donald Campion M.D.   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 Yes Co-Morbidities requiring supervision/potential complications:  -Chronic vertigo, diabetes mellitus type 2 with polyneuropathy, DKA, BPH, hypertension, male breast cancer Due to bladder management, bowel management, safety, skin/wound care, disease management, medication administration, pain management, and patient education, does the patient require 24 hr/day rehab nursing? Yes Does the patient require coordinated care of a physician, rehab nurse, therapy disciplines of PT/OT to address physical and functional deficits in the context of the above medical diagnosis(es)? Yes Addressing deficits in the following areas: balance, endurance, locomotion, strength, transferring, bowel/bladder control, bathing, dressing, feeding, grooming, toileting, and psychosocial support Can the patient actively participate in an intensive therapy program of at least  3 hrs of therapy per day at least 5 days per week? Yes The potential for patient to make measurable gains while on inpatient rehab is excellent Anticipated functional outcomes upon discharge from inpatient rehab are modified independent and supervision  with PT, modified independent and supervision with OT, n/a with SLP. Estimated rehab length of stay to reach the above functional goals is: 7-10 Anticipated discharge destination: Home Overall Rehab/Functional Prognosis: excellent  POST ACUTE RECOMMENDATIONS: This patient's condition is appropriate for continued rehabilitative care in the following setting: CIR Patient has agreed to participate in recommended program. Yes Note that insurance prior authorization may be required for reimbursement for recommended care.  Comment: Patient appears to be a good candidate for CIR pending medical stability.  Rehab coordinator to follow-up   MEDICAL RECOMMENDATIONS: Consider laxative for constipation   I have personally performed a face to face diagnostic evaluation of this patient. Additionally, I have examined the patient's medical record including any pertinent labs and radiographic images. If the physician assistant has documented in this note, I have reviewed and edited or otherwise concur with the physician assistant's documentation.  Thanks,  Murray Collier, MD 04/20/2023

## 2023-04-20 NOTE — Progress Notes (Addendum)
 STROKE TEAM PROGRESS NOTE   BRIEF HPI [name] is a 78 y.o. male with history of BPH, depression, diabetes, vertigo, hypertension, GERD, breast cancer status post chemotherapy and surgery, hyperlipidemia and neuropathy presenting with severe nausea and vertigo preventing ambulation.  Patient was found to be hyperglycemic with slightly elevated beta hydroxybutyrate level at 1.85, which improved with IV fluids.  He was also noted to have nystagmus, and MRI demonstrated small stroke in the right middle cerebellar peduncle.  Patient states that his symptoms have improved since admission and that he was able to ambulate down the hall.  NIH on Admission 2  INTERIM HISTORY/SUBJECTIVE  Patient has remained hemodynamically stable and afebrile.  He was able to ambulate down the hall with assistance earlier today.  He reports that his dizziness has improved.  OBJECTIVE  CBC    Component Value Date/Time   WBC 7.7 04/20/2023 0402   RBC 4.40 04/20/2023 0402   HGB 11.9 (L) 04/20/2023 0402   HCT 36.9 (L) 04/20/2023 0402   PLT 235 04/20/2023 0402   MCV 83.9 04/20/2023 0402   MCH 27.0 04/20/2023 0402   MCHC 32.2 04/20/2023 0402   RDW 14.9 04/20/2023 0402   LYMPHSABS 1.4 11/08/2022 2120   MONOABS 0.5 11/08/2022 2120   EOSABS 0.1 11/08/2022 2120   BASOSABS 0.1 11/08/2022 2120    BMET    Component Value Date/Time   NA 134 (L) 04/20/2023 0402   K 3.4 (L) 04/20/2023 0402   CL 104 04/20/2023 0402   CO2 22 04/20/2023 0402   GLUCOSE 239 (H) 04/20/2023 0402   BUN 18 04/20/2023 0402   CREATININE 1.22 04/20/2023 0402   CREATININE 1.02 12/12/2019 0927   CALCIUM  8.8 (L) 04/20/2023 0402   GFRNONAA >60 04/20/2023 0402    IMAGING past 24 hours CT ANGIO HEAD NECK W WO CM Result Date: 04/20/2023 CLINICAL DATA:  Stroke/TIA.  Determine embolic source. EXAM: CT ANGIOGRAPHY HEAD AND NECK WITH AND WITHOUT CONTRAST TECHNIQUE: Multidetector CT imaging of the head and neck was performed using the  standard protocol during bolus administration of intravenous contrast. Multiplanar CT image reconstructions and MIPs were obtained to evaluate the vascular anatomy. Carotid stenosis measurements (when applicable) are obtained utilizing NASCET criteria, using the distal internal carotid diameter as the denominator. RADIATION DOSE REDUCTION: This exam was performed according to the departmental dose-optimization program which includes automated exposure control, adjustment of the mA and/or kV according to patient size and/or use of iterative reconstruction technique. CONTRAST:  75mL OMNIPAQUE  IOHEXOL  350 MG/ML SOLN COMPARISON:  MRI yesterday. FINDINGS: CT HEAD FINDINGS Brain: Chronic small-vessel ischemic changes affect the pons. Small acute infarction of the middle cerebellar peduncle on the right cannot be specifically seen by CT. Cerebral hemispheres show chronic small-vessel ischemic changes of the white matter without evidence of an acute stroke. No mass, hemorrhage, hydrocephalus or extra-axial collection. Vascular: There is atherosclerotic calcification of the major vessels at the base of the brain. Skull: Negative Sinuses/Orbits: Clear/normal Other: None Review of the MIP images confirms the above findings CTA NECK FINDINGS Aortic arch: Aortic atherosclerosis.  Branching pattern is normal. Right carotid system: Common carotid artery widely patent to the bifurcation. Minimal atherosclerotic plaque at the bifurcation and ICA bulb but no stenosis. Cervical ICA widely patent. Left carotid system: Common carotid artery widely patent to the bifurcation. Calcified plaque at the bifurcation and proximal ICA bulb. Minimal diameter of the proximal ICA is 1.75 mm. Compared to a more distal cervical ICA diameter of 4.2  mm, this indicates a 60% stenosis. Vertebral arteries: No proximal subclavian stenosis. Right vertebral artery origin is widely patent. The vessel appears normal through the cervical region to the foramen  magnum. There is soft and calcified plaque at the left vertebral artery origin with a 50% stenosis. Beyond that, the vessel is patent through the cervical region to the foramen magnum. Skeleton: Minimal spondylosis. Other neck: No mass or lymphadenopathy. Upper chest: Chronic scarring in the anterior right upper lobe. Review of the MIP images confirms the above findings CTA HEAD FINDINGS Anterior circulation: Both internal carotid arteries are patent through the skull base and siphon regions. There is siphon atherosclerotic calcification with maximal stenosis estimated at 30% on both sides. The anterior and middle cerebral vessels are patent. No large vessel occlusion or flow limiting proximal stenosis. No aneurysm or vascular malformation. Posterior circulation: Both vertebral arteries are widely patent to the basilar artery. No basilar stenosis. Posterior circulation branch vessels are patent. There is moderate long segment stenosis of the proximal right PCA, but the vessel does show persistent flow. Venous sinuses: Patent and normal. Anatomic variants: None significant. Review of the MIP images confirms the above findings IMPRESSION: 1. No large vessel occlusion or flow limiting proximal intracranial stenosis. 2. 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 3. 50% stenosis of the left vertebral artery origin due to soft and calcified plaque. 4. 30% stenosis of both internal carotid artery siphon regions due to calcified plaque. 5. Moderate long segment stenosis of the proximal right PCA, but the vessel does show persistent flow. 6. Aortic atherosclerosis. 7. Chronic small-vessel ischemic changes of the pons and cerebral hemispheric white matter. Small acute infarction of the middle cerebellar peduncle on the right shown by MRI cannot be specifically seen by CT. Aortic Atherosclerosis (ICD10-I70.0). Electronically Signed   By: Oneil Officer M.D.   On: 04/20/2023 11:22   MR BRAIN W WO  CONTRAST Result Date: 04/19/2023 CLINICAL DATA:  Vertigo EXAM: MRI HEAD WITHOUT AND WITH CONTRAST TECHNIQUE: Multiplanar, multiecho pulse sequences of the brain and surrounding structures were obtained without and with intravenous contrast. CONTRAST:  10mL GADAVIST  GADOBUTROL  1 MMOL/ML IV SOLN COMPARISON:  11/08/2022 MRI head, 04/16/2023 CT head FINDINGS: Brain: Small area of restricted diffusion with ADC correlate in the right middle cerebellar peduncle (series 2, image 13 and 14; series 3, image 14). This is associated with a small amount of increased T2 hyperintense signal. No acute hemorrhage, mass, mass effect, or midline shift. No hydrocephalus or extra-axial collection. Pituitary and craniocervical junction within normal limits. Focus of hemosiderin deposition in the pons, likely sequela prior hypertensive microhemorrhage. Remote lacunar infarcts in the right lentiform nucleus, bilateral thalami, and right pons. Advanced cerebral atrophy for age. Confluent T2 hyperintense signal in the periventricular white matter, likely the sequela of moderate chronic small vessel ischemic disease. Dilated perivascular spaces in the basal ganglia and cortex. Vascular: Normal arterial flow voids. Skull and upper cervical spine: Normal marrow signal. Sinuses/Orbits: Clear paranasal sinuses. No acute finding in the orbits. Status post bilateral lens replacements. Other: Fluid in the right mastoid air cells. IMPRESSION: Small acute infarct in the right middle cerebellar peduncle. Electronically Signed   By: Donald Campion M.D.   On: 04/19/2023 19:40    Vitals:   04/20/23 0331 04/20/23 0625 04/20/23 0756 04/20/23 1024  BP: (!) 145/66  (!) 143/77 (!) 116/55  Pulse: 95  85 93  Resp: 17  18 18   Temp: 99.3 F (37.4 C)  98.1 F (36.7 C) 98.2 F (36.8 C)  TempSrc: Oral  Oral Oral  SpO2: 95%  96% 98%  Weight:  104.4 kg    Height:         PHYSICAL EXAM General:  Alert, well-nourished, well-developed patient in no  acute distress Psych:  Mood and affect appropriate for situation Respiratory:  Regular, unlabored respirations on room air   NEURO:  Mental Status: AA&Ox3, patient is able to give clear and coherent history Speech/Language: speech is without dysarthria or aphasia.    Cranial Nerves:  II: PERRL. Visual fields full.  Horizontal nystagmus noted with lateral gaze III, IV, VI: EOMI. Eyelids elevate symmetrically.  V: Sensation is intact to light touch and symmetrical to face.  VII: Face is symmetrical resting and smiling VIII: hearing intact to voice. IX, X: Phonation is normal.  KP:Dynloizm shrug 5/5. XII: tongue is midline without fasciculations. Motor: 5/5 strength to all muscle groups tested.  Tone: is normal and bulk is normal Sensation- Intact to light touch bilaterally. Extinction absent to light touch to DSS.   Coordination: FTN intact bilaterally, HKS: no ataxia in BLE.  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 Small vessel disease.  CTA head & neck no LVO, 60% stenosis of proximal left ICA, 50% stenosis of origin of left vertebral artery, 30% stenosis of bilateral carotid artery siphon regions, moderate long segment stenosis of proximal right PCA MRI small acute infarct in right middle cerebellar peduncle 2D Echo pending LDL 78 (04/11/23) HgbA1c 12.4 (04/11/23) VTE prophylaxis -SCDs aspirin  325 mg daily prior to admission, now on aspirin  81 mg daily and clopidogrel  75 mg daily for 3 weeks and then Plavix  alone. Therapy recommendations:  CIR Disposition: Pending  Hypertension Home meds: Amlodipine  5 mg daily Stable Maintain normotension  Hyperlipidemia Home meds: Atorvastatin  40 mg daily but he is not taking at home LDL 78 on 04/11/23, goal < 70 On lipitor  40 Continue statin at discharge  Diabetes type II Uncontrolled Home meds:  Toujeo  45 units twice daily, metformin  1000 mg twice daily HgbA1c 12.4 on 04/11/23, goal < 7.0 CBGs SSI Recommend close follow-up with PCP for better DM control, patient verbalizes some changes he will make to his diet to improve his control of his diabetes  Other Stroke Risk Factors Obesity, Body mass index is 30.36 kg/m., BMI >/= 30 associated with increased stroke risk, recommend weight loss, diet and exercise as appropriate  Vertigo in 10/2022 resolved in 2h, MRI neg, not sure if ? Posterior TIA  Other Active Problems History of breast cancer - T2 M0 invasive carcinoma right breast diagnosed 04/2018 + neoadjuvant systemic chemo ocetaxel/carboplatin/trastuzumab/epratuzumab + modified radical mastectomy 04/2018 on tamoxifen  since April 2020 - follow-up with outpatient oncologist. BPH CTS (carpal tunnel syndrome)  Hospital day # 0  Patient seen by NP and then by MD, MD to edit note as needed. Cortney E Everitt Clint Kill , MSN, AGACNP-BC [facility] See Amion for schedule and pager information 04/20/2023 12:53 PM   ATTENDING NOTE: I reviewed above note and agree with the assessment and plan. Pt was seen and examined.   RN at bedside.  Patient lying in bed, stated that he feels much better today, was able to walk with PT resulted significant imbalance.  He stated that he had a vertigo episode in 10/2022 lasted 2 hours and MRI negative at that time.  This  time he got up from recliner early morning and felt difficulty walking and fell in the bathroom.  His wife got him up in the morning.  He was sent to hospital but he continued to have vertigo, not able to lie flat for MRI.  Gradually symptoms improved and MRI done yesterday showed right middle cerebellar peduncle infarct.  On exam, AOx3, no aphasia, follows simple commands.  Able to name repeat.  No gaze palsy, no nystagmus, visual fields full, no disconjugate eyes.  Intact eye movement.  Facial symmetric, moving all extremities equally.   Still has mild right finger to nose dysmetria.  Heel-to-shin intact bilaterally.  Etiology for stroke likely small vessel disease at this time.  Not sure if 10/2022 vertigo episode was TIA.  CT head and neck no basilar artery stenosis.  2D echo pending.  Patient did have uncontrolled risk factors including diabetes.  Recommend DAPT for 3 weeks and then Plavix  alone.  On Lipitor  now.  Medication compliance education provided.  PT and OT recommend CIR.  For detailed assessment and plan, please refer to above/below as I have made changes wherever appropriate.   Neurology will sign off. Please call with questions. Pt will follow up with stroke clinic NP at [facility] in about 4 weeks. Thanks for the consult.   Ary Cummins, MD PhD Stroke Neurology 04/20/2023 4:40 PM    To contact Stroke Continuity provider, please refer to [email or web address]. After hours, contact General Neurology

## 2023-04-20 NOTE — Progress Notes (Signed)
 Physical Therapy Treatment Patient Details Name: John [name] MRN: [id number] DOB: [birth year] Today's Date: 04/20/2023   History of Present Illness Pt is a 78 y.o. male who presented 04/16/23 with dizziness and nausea. CT head no acute intracranial finding. MRI brain pending. Pt also noted to have hyperglycemic crisis/early development of DKA and acute kidney injury. PMH: chronic vertigo, insulin -dependent DM2, HTN, HLD, peripheral neuropathy, breast cancer, carpal tunnel syndrome    PT Comments  MRI revealed has an acute infarct in the R middle cerebellar peduncle which explains pt's dizziness, nausea, impaired balance and coordination. Pt much improved today and was able to amb 49' with RW with modA. Pt with less ataxia however R lateral posterior bias and vearing to the R with ambulation. Pt continues with imparerd coordination missing PT's hand multiple times when attempting to give high fives. Pt to strongly benefit from aggressive inpatient rehab program > 3 hrs a day to address above deficits as pt was indep without AD PTA. Pt demonstrates excellent rehab potential as he has improved daily from functional stand point indicated high potential to return to safe mod I level of function prior to return home with spouse. Acute PT to cont to follow.   If plan is discharge home, recommend the following: A lot of help with walking and/or transfers;A little help with bathing/dressing/bathroom;Assist for transportation;Assistance with cooking/housework;Help with stairs or ramp for entrance   Can travel by private vehicle        Equipment Recommendations  Wheelchair (measurements PT) (transport wheelchair)    Recommendations for Other Services Rehab consult;OT consult     Precautions / Restrictions Precautions Precautions: Fall Precaution Comments: R BPPV vs hypofunction vs central cause Restrictions Weight Bearing Restrictions Per Provider Order: No     Mobility  Bed Mobility Overal bed  mobility: Needs Assistance Bed Mobility: Supine to Sit     Supine to sit: Contact guard     General bed mobility comments: pt able to bring LEs off EOB, use bed rail to pull self up to EOB, HOB elevated, reports he sleeps in a recliner    Transfers Overall transfer level: Needs assistance Equipment used: Rolling walker (2 wheels) Transfers: Sit to/from Stand Sit to Stand: Mod assist           General transfer comment: modA to power up, max verbal cues for hand placement to push up from bed, pt with posterior bias requiring minA to stabilize, also with R lateral bias as well    Ambulation/Gait Ambulation/Gait assistance: Mod assist Gait Distance (Feet): 75 Feet (x2 (required seated rest break)) Assistive device: Rolling walker (2 wheels) Gait Pattern/deviations: Step-through pattern, Decreased stride length, Staggering right, Narrow base of support Gait velocity: dec Gait velocity interpretation: <1.31 ft/sec, indicative of household ambulator   General Gait Details: minA progressing to modA. Pt much improved from yesterday with minimal ataxia however hugs the R side of the walker and vears to the Right requiring assist for walker management. With onset of fatigue pt with noted LOB x3 requiring modA to prevent fall, pt with short step length and narrow base of support with onset of fatigue as well   Stairs             Wheelchair Mobility     Tilt Bed    Modified Rankin (Stroke Patients Only) Modified Rankin (Stroke Patients Only) Pre-Morbid Rankin Score: Slight disability Modified Rankin: Moderately severe disability     Balance Overall balance assessment: Needs assistance Sitting-balance support: Feet supported  Sitting balance-Leahy Scale: Fair Sitting balance - Comments: Posterior lean, more prominent when doing dynamic activity, c/o back being itchy, denies dizziness Postural control: Posterior lean Standing balance support: Bilateral upper extremity  supported Standing balance-Leahy Scale: Poor Standing balance comment: UE support for balance, and physical assist to prevent fall backwards                            Cognition Arousal: Alert Behavior During Therapy: WFL for tasks assessed/performed Overall Cognitive Status: Within Functional Limits for tasks assessed                                 General Comments: pt with understanding of having had a CVA in cerebellum and need for rehab, cooperative, eager to mobilize, able to follow commands. pt HOH causing some delay in processing but overall good cognition        Exercises      General Comments General comments (skin integrity, edema, etc.): VSS      Pertinent Vitals/Pain Pain Assessment Pain Assessment: No/denies pain Faces Pain Scale: No hurt    Home Living                          Prior Function            PT Goals (current goals can now be found in the care plan section) Acute Rehab PT Goals Patient Stated Goal: to know what is going on and improve PT Goal Formulation: With patient Time For Goal Achievement: 05/01/23 Potential to Achieve Goals: Fair Progress towards PT goals: Progressing toward goals    Frequency    Min 1X/week      PT Plan      Co-evaluation              AM-PAC PT 6 Clicks Mobility   Outcome Measure  Help needed turning from your back to your side while in a flat bed without using bedrails?: A Lot Help needed moving from lying on your back to sitting on the side of a flat bed without using bedrails?: A Little Help needed moving to and from a bed to a chair (including a wheelchair)?: A Lot Help needed standing up from a chair using your arms (e.g., wheelchair or bedside chair)?: A Lot Help needed to walk in hospital room?: A Lot Help needed climbing 3-5 steps with a railing? : Total 6 Click Score: 12    End of Session Equipment Utilized During Treatment: Gait belt Activity  Tolerance: Patient tolerated treatment well Patient left: with call bell/phone within reach;in chair;with chair alarm set Nurse Communication: Mobility status PT Visit Diagnosis: Other abnormalities of gait and mobility (R26.89);Difficulty in walking, not elsewhere classified (R26.2);Other symptoms and signs involving the nervous system (R29.898);Dizziness and giddiness (R42) BPPV - Right/Left : Right     Time: 9096-9068 PT Time Calculation (min) (ACUTE ONLY): 28 min  Charges:    $Gait Training: 23-37 mins PT General Charges $$ ACUTE PT VISIT: 1 Visit                     Norene Ames, PT, DPT Acute Rehabilitation Services Secure chat preferred Office #: [phone]    Norene CHRISTELLA Ames 04/20/2023, 10:58 AM

## 2023-04-20 NOTE — Progress Notes (Signed)
 SLP Cancellation Note  Patient Details Name: Chares Slaymaker MRN: [id number] DOB: 1945/08/03   Cancelled treatment:       Reason Eval/Treat Not Completed: SLP screened, no needs identified, will sign off  Norleen IVAR Blase, MA, CCC-SLP Speech Therapy

## 2023-04-20 NOTE — Progress Notes (Addendum)
  Inpatient Rehabilitation Admissions Coordinator   Met with patient  at bedside for rehab assessment. We discussed goals and expectations of a possible CIR admit. He prefers CIR for rehab. I have asked him to call me when his wife arrives, so that I can also discuss CIR option with her. Rehab MD also to consult today to assist with Auth for CIR.  I will begin insurance Auth with [facility] for possible CIR admit pending approval. Please call me with any questions.   Heron Leavell, RN, MSN Rehab Admissions Coordinator [phone]  I met with patient and wife and reviewed goals, expectations and estimated cost of care for CIR admit. They wish to pursue admit.  Heron Leavell, RN, MSN Rehab Admissions Coordinator [phone] 04/20/2023 1:53 PM

## 2023-04-20 NOTE — Progress Notes (Signed)
 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 Presented 1/4 med [facility] with vertigo, nausea, unable to tolerate oral appears to be in mild DKA.  CT head no acute intracranial abnormality.  Subsequently had MRI which was positive for acute stroke     Assessment & Plan:   Principal Problem:   DKA (diabetic ketoacidosis) (HCC) Active Problems:   Chronic vertigo   Insulin  dependent type 2 diabetes mellitus (HCC)   Peripheral neuropathy   Essential hypertension   BPH (benign prostatic hyperplasia)   Hyperlipidemia   Acute kidney injury 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)  1-Acute right middle cerebellar peduncle stroke: Etiology of vessels disease CT head: No acute abnormality CTA head and neck no large vessel occlusion, 60% stenosis of the proximal left ICA and 50% stenosis of the origin of the left vertebral artery, 30% stenosis bilateral carotid artery. -MRI small acute infarct right middle cerebellar peduncle -Echo pending -LDL 78 -A1c 12 -Plan to continue aspirin  and Plavix  for 3 weeks then Plavix  alone Therapy recommend CIR  Diabetes type 2 , uncontrolled, DKA on admission Continue 70/30 twice daily: Continue sliding scale.  We discussed about diet Hemoglobin A1c 12  Hypertension: Continue amlodipine  Hyperlipidemia: LDL 78 continue Lipitor  at discharge  Hypokalemia; replaced.   Estimated body mass index is 30.36 kg/m as calculated from the following:   Height as  of this encounter: 6' 1 (1.854 m).   Weight as of this encounter: 104.4 kg.   DVT prophylaxis: SCD Code Status: Full code Family Communication: wife at bedside.  Disposition Plan:  Status is: Observation The patient remains OBS appropriate and will d/c before 2 midnights.    Consultants:  Neurology   Procedures:  ECHO Antimicrobials:    Subjective: He is alert, feeling better.  We talk about diet for DM.   Objective: Vitals:   04/20/23 0015 04/20/23 0331 04/20/23 0625 04/20/23 0756  BP: (!) 156/74 (!) 145/66  (!) 143/77  Pulse: 78 95    Resp: 20 17  18   Temp: 98.6 F (37 C) 99.3 F (37.4 C)  98.1 F (36.7 C)  TempSrc: Oral Oral  Oral  SpO2: 94% 95%    Weight:   104.4 kg   Height:        Intake/Output Summary (Last 24 hours) at 04/20/2023 0843 Last data filed at 04/20/2023 9374 Gross per 24 hour  Intake --  Output 1700 ml  Net -1700 ml   Filed Weights   04/18/23 0355 04/19/23 0334 04/20/23 0625  Weight: 107.9 kg 104 kg 104.4 kg    Examination:  General exam: Appears calm and comfortable  Respiratory system: Clear to auscultation. Respiratory effort normal. Cardiovascular system: S1 & S2 heard, RRR. No JVD, murmurs, rubs, gallops or clicks. No pedal edema. Gastrointestinal system: Abdomen is nondistended, soft and nontender. No organomegaly or masses felt. Normal bowel sounds heard. Central nervous system: Alert and oriented.  Extremities: Symmetric 5 x 5 power.    Data Reviewed: I have personally reviewed following labs and imaging studies  CBC: Recent Labs  Lab 04/16/23 1448 04/16/23 1555 04/17/23 0737 04/18/23 0243 04/19/23 0332 04/20/23 0402  WBC 9.3  --  11.6* 11.7* 8.2 7.7  HGB 12.7* 12.9* 11.7* 12.0* 12.0* 11.9*  HCT 39.4 38.0* 35.9* 37.3* 37.1* 36.9*  MCV 83.3  --  83.1 85.2 83.4 83.9  PLT 311  --  285 262 253 235   Basic Metabolic Panel: Recent Labs  Lab 04/17/23 0201 04/17/23 0737 04/18/23 0243 04/19/23 0332 04/20/23 0402   NA 137 140 139 137 134*  K 4.5 4.0 3.5 3.0* 3.4*  CL 100 102 106 103 104  CO2 22 25 23 23 22   GLUCOSE 310* 271* 158* 156* 239*  BUN 22 20 17 18 18   CREATININE 1.20 1.08 0.99 1.02 1.22  CALCIUM  8.7* 9.0 8.9 9.0 8.8*   GFR: Estimated Creatinine Clearance: 65.4 mL/min (by C-G formula based on SCr of 1.22 mg/dL). Liver Function Tests: Recent Labs  Lab 04/16/23 1448 04/17/23 0737 04/18/23 0243 04/19/23 0332 04/20/23 0402  AST 40 23 36 39 40  ALT 32 25 25 28  36  ALKPHOS 44 35* 40 40 40  BILITOT 1.2 1.0 1.0 1.3* 1.2  PROT 7.4 5.7* 5.7* 5.5* 5.2*  ALBUMIN 4.2 3.2* 3.0* 2.8* 2.8*   Recent Labs  Lab 04/16/23 1448  LIPASE 35   No results for input(s): AMMONIA in the last 168 hours. Coagulation Profile: No results for input(s): INR, PROTIME in the last 168 hours. Cardiac Enzymes: No results for input(s): CKTOTAL, CKMB, CKMBINDEX, TROPONINI in the last 168 hours. BNP (last 3 results) No results for input(s): PROBNP in the last 8760 hours. HbA1C: No results for input(s): HGBA1C in the last 72 hours. CBG: Recent Labs  Lab 04/19/23 0805 04/19/23 1214 04/19/23 1707 04/19/23 2103 04/20/23 0620  GLUCAP 165* 222* 350* 298* 256*   Lipid Profile: No results for input(s): CHOL, HDL, LDLCALC, TRIG, CHOLHDL, LDLDIRECT in the last 72 hours. Thyroid  Function Tests: No results for input(s): TSH, T4TOTAL, FREET4, T3FREE, THYROIDAB in the last 72 hours. Anemia Panel: No results for input(s): VITAMINB12, FOLATE, FERRITIN, TIBC, IRON, RETICCTPCT in the last 72 hours. Sepsis Labs: No results for input(s): PROCALCITON, LATICACIDVEN in the last 168 hours.  No results found for this or any previous visit (from the past 240 hours).       Radiology Studies: MR BRAIN W WO CONTRAST Result Date: 04/19/2023 CLINICAL DATA:  Vertigo EXAM: MRI HEAD WITHOUT AND WITH CONTRAST TECHNIQUE: Multiplanar, multiecho pulse sequences of the brain  and surrounding structures were obtained without and with intravenous contrast. CONTRAST:  10mL GADAVIST  GADOBUTROL  1 MMOL/ML IV SOLN COMPARISON:  11/08/2022 MRI head, 04/16/2023 CT head FINDINGS: Brain: Small area of restricted diffusion with ADC correlate in the right middle cerebellar peduncle (series 2, image 13 and 14; series 3, image 14). This is associated with a small amount of increased T2 hyperintense signal. No acute hemorrhage, mass, mass effect, or midline shift. No hydrocephalus or extra-axial collection. Pituitary and craniocervical junction within normal limits. Focus of hemosiderin deposition in the pons, likely sequela prior hypertensive microhemorrhage. Remote lacunar infarcts in the right lentiform nucleus, bilateral thalami, and right pons. Advanced cerebral atrophy for age. Confluent T2 hyperintense signal in the periventricular white matter, likely the sequela of moderate chronic small vessel ischemic disease. Dilated perivascular spaces in the basal ganglia and cortex. Vascular: Normal arterial flow voids. Skull and upper cervical spine: Normal marrow signal. Sinuses/Orbits: Clear paranasal sinuses. No acute finding in the orbits. Status post bilateral lens replacements. Other: Fluid  in the right mastoid air cells. IMPRESSION: Small acute infarct in the right middle cerebellar peduncle. Electronically Signed   By: Donald Campion M.D.   On: 04/19/2023 19:40        Scheduled Meds:  [START ON 04/21/2023]  stroke: early stages of recovery book   Does not apply Once   amLODipine   5 mg Oral Daily   aspirin  EC  81 mg Oral Daily   atorvastatin   80 mg Oral QHS   clopidogrel   300 mg Oral Once   Followed by   [START ON 04/21/2023] clopidogrel   75 mg Oral Daily   finasteride   5 mg Oral Daily   insulin  aspart  0-9 Units Subcutaneous TID WC   insulin  aspart protamine- aspart  15 Units Subcutaneous BID WC   pantoprazole   40 mg Oral Daily   potassium chloride   40 mEq Oral Daily   sodium  chloride flush  3 mL Intravenous Q12H   tamsulosin   0.4 mg Oral Daily   Continuous Infusions:   LOS: 0 days    Time spent: 35 minutes    Traquan Duarte A Raschelle Wisenbaker, MD [facility]   If 7PM-7AM, please contact night-coverage [email or web address]  04/20/2023, 8:43 AM

## 2023-04-21 ENCOUNTER — Inpatient Hospital Stay (HOSPITAL_COMMUNITY): Payer: Medicare Other

## 2023-04-21 DIAGNOSIS — I6302 Cerebral infarction due to thrombosis of basilar artery: Secondary | ICD-10-CM | POA: Diagnosis not present

## 2023-04-21 DIAGNOSIS — I6389 Other cerebral infarction: Secondary | ICD-10-CM | POA: Diagnosis not present

## 2023-04-21 LAB — ECHOCARDIOGRAM COMPLETE
AR max vel: 2.85 cm2
AV Area VTI: 2.96 cm2
AV Area mean vel: 2.59 cm2
AV Mean grad: 3 mm[Hg]
AV Peak grad: 4.4 mm[Hg]
Ao pk vel: 1.05 m/s
Area-P 1/2: 3.51 cm2
Calc EF: 61.9 %
Height: 73 in
MV VTI: 3.06 cm2
S' Lateral: 2.9 cm
Single Plane A2C EF: 65.8 %
Single Plane A4C EF: 58.5 %
Weight: 3693.15 [oz_av]

## 2023-04-21 LAB — LIPID PANEL
Cholesterol: 100 mg/dL (ref 0–200)
HDL: 22 mg/dL — ABNORMAL LOW (ref >40)
LDL Cholesterol: 54 mg/dL (ref 0–99)
Total CHOL/HDL Ratio: 4.5 {ratio}
Triglycerides: 120 mg/dL (ref <150)
VLDL: 24 mg/dL (ref 0–40)

## 2023-04-21 LAB — GLUCOSE, CAPILLARY
Glucose-Capillary: 190 mg/dL — ABNORMAL HIGH (ref 70–99)
Glucose-Capillary: 276 mg/dL — ABNORMAL HIGH (ref 70–99)
Glucose-Capillary: 244 mg/dL — ABNORMAL HIGH (ref 70–99)
Glucose-Capillary: 226 mg/dL — ABNORMAL HIGH (ref 70–99)

## 2023-04-21 MED ORDER — TRIAMCINOLONE 0.1 % CREAM:EUCERIN CREAM 1:1
TOPICAL_CREAM | Freq: Two times a day (BID) | CUTANEOUS | Status: DC
Start: 2023-04-21 — End: 2023-04-22
  Filled 2023-04-21 (×2): qty 1

## 2023-04-21 MED ORDER — POLYETHYLENE GLYCOL 3350 17 G PO PACK
17.0000 g | PACK | Freq: Two times a day (BID) | ORAL | Status: DC
  Administered 2023-04-21 – 2023-04-22 (×3): 17 g via ORAL
  Filled 2023-04-21 (×3): qty 1

## 2023-04-21 MED ORDER — INSULIN ASPART PROT & ASPART (70-30 MIX) 100 UNIT/ML ~~LOC~~ SUSP
19.0000 [IU] | Freq: Two times a day (BID) | SUBCUTANEOUS | Status: DC
  Administered 2023-04-21 – 2023-04-22 (×2): 19 [IU] via SUBCUTANEOUS

## 2023-04-21 MED ORDER — PERFLUTREN LIPID MICROSPHERE
1.0000 mL | INTRAVENOUS | Status: AC | PRN
  Administered 2023-04-21: 2 mL via INTRAVENOUS

## 2023-04-21 NOTE — Progress Notes (Signed)
 Inpatient Rehabilitation Admissions Coordinator   I have insurance approval for CIR, but bed not available to admit today. Hopeful for bed Friday. I met with patient and his wife and they are aware.  Heron Leavell, RN, MSN Rehab Admissions Coordinator [phone] 04/21/2023 12:47 PM

## 2023-04-21 NOTE — Progress Notes (Signed)
 Occupational Therapy Treatment Patient Details Name: John [name] MRN: [id number] DOB: 10/28/1945 Today's Date: 04/21/2023   History of present illness Pt is a 78 y.o. male who presented 04/16/23 with dizziness and nausea. CT head no acute intracranial finding. MRI brain pending. Pt also noted to have hyperglycemic crisis/early development of DKA and acute kidney injury. PMH: chronic vertigo, insulin -dependent DM2, HTN, HLD, peripheral neuropathy, breast cancer, carpal tunnel syndrome   OT comments  Pt c/o poor balance, no pain. Pt able to complete bed mobility with supervision for safety, able to complete bedside ADLs with set up/supervision. Pt has slight posterior lean with sitting but able to maintain balance. Upon standing and ambulating around room with RW Pt had multiple instances of LOB, min A to recover. Pt did not feel dizzy or lightheaded, says his legs just don't maintain his balance well. Pt assisted back to bed and he recounted his medical history which caused his neuropathy and medical problems, he and his wife are upset above a misdiagnosis in the past and the treatment they received, using active listening and ensured Pt we would do everything we can to improve his balance and improve back to PLOF, they are expecting to be transferred to postacute rehab >3hrs/day tomorrow, excited for it. Will continue to follow acutely.       If plan is discharge home, recommend the following:  A lot of help with bathing/dressing/bathroom;A lot of help with walking and/or transfers;Assist for transportation;Help with stairs or ramp for entrance   Equipment Recommendations  Other (comment) (defer)    Recommendations for Other Services      Precautions / Restrictions Precautions Precautions: Fall Precaution Comments: R BPPV vs hypofunction vs central cause Restrictions Weight Bearing Restrictions Per Provider Order: No       Mobility Bed Mobility Overal bed mobility: Needs  Assistance Bed Mobility: Supine to Sit, Sit to Supine     Supine to sit: Supervision Sit to supine: Supervision   General bed mobility comments: supervision for safety    Transfers Overall transfer level: Needs assistance Equipment used: Rolling walker (2 wheels) Transfers: Sit to/from Stand, Bed to chair/wheelchair/BSC Sit to Stand: Min assist     Step pivot transfers: Min assist     General transfer comment: min A to maintain balance with RW     Balance Overall balance assessment: Needs assistance Sitting-balance support: No upper extremity supported, Feet supported Sitting balance-Leahy Scale: Fair Sitting balance - Comments: EOB ADLs, slight posterior lean but able to self correct   Standing balance support: Bilateral upper extremity supported, During functional activity, Reliant on assistive device for balance Standing balance-Leahy Scale: Poor Standing balance comment: frequent LOB, min A to correct with RW                           ADL either performed or assessed with clinical judgement   ADL Overall ADL's : Needs assistance/impaired Eating/Feeding: Independent   Grooming: Set up;Sitting   Upper Body Bathing: Set up;Supervision/ safety   Lower Body Bathing: Set up;Contact guard assist   Upper Body Dressing : Set up   Lower Body Dressing: Set up;Sitting/lateral leans   Toilet Transfer: Minimal assistance;Rolling walker (2 wheels)   Toileting- Clothing Manipulation and Hygiene: Set up;Sitting/lateral lean       Functional mobility during ADLs: Minimal assistance;Rolling walker (2 wheels) General ADL Comments: min A to maintain balance while standing/ambulating due to poor balance. set up for UB/LB ADLs  Extremity/Trunk Assessment              Vision       Perception     Praxis      Cognition Arousal: Alert Behavior During Therapy: WFL for tasks assessed/performed Overall Cognitive Status: Within Functional Limits for tasks  assessed                                          Exercises      Shoulder Instructions       General Comments VSS. Wife present end of session    Pertinent Vitals/ Pain       Pain Assessment Pain Assessment: No/denies pain  Home Living                                          Prior Functioning/Environment              Frequency  Min 1X/week        Progress Toward Goals  OT Goals(current goals can now be found in the care plan section)  Progress towards OT goals: Progressing toward goals  Acute Rehab OT Goals Patient Stated Goal: to improve balance OT Goal Formulation: With patient Time For Goal Achievement: 05/02/23 Potential to Achieve Goals: Good ADL Goals Pt Will Perform Grooming: with supervision;standing Pt Will Perform Upper Body Bathing: with supervision;with set-up;sitting Pt Will Perform Lower Body Bathing: with min assist;sitting/lateral leans;sit to/from stand Pt Will Perform Upper Body Dressing: with supervision;with set-up;sitting Pt Will Transfer to Toilet: with mod assist;with min assist;ambulating;stand pivot transfer;bedside commode;grab bars Pt Will Perform Toileting - Clothing Manipulation and hygiene: with mod assist;with min assist;sit to/from stand  Plan      Co-evaluation                 AM-PAC OT 6 Clicks Daily Activity     Outcome Measure   Help from another person eating meals?: None Help from another person taking care of personal grooming?: A Little Help from another person toileting, which includes using toliet, bedpan, or urinal?: A Little Help from another person bathing (including washing, rinsing, drying)?: A Little Help from another person to put on and taking off regular upper body clothing?: A Little Help from another person to put on and taking off regular lower body clothing?: A Little 6 Click Score: 19    End of Session Equipment Utilized During Treatment: Gait  belt;Rolling walker (2 wheels)  OT Visit Diagnosis: Other abnormalities of gait and mobility (R26.89);Muscle weakness (generalized) (M62.81);BPPV;Dizziness and giddiness (R42)   Activity Tolerance Patient tolerated treatment well   Patient Left in bed;with call bell/phone within reach;with family/visitor present   Nurse Communication Mobility status;Other (comment) (informed nurse of rash on back wife noticed, Pt already returned to bed I did not observe a rash.)        Time: 1436-1456 OT Time Calculation (min): 20 min  Charges: OT General Charges $OT Visit: 1 Visit OT Treatments $Self Care/Home Management : 8-22 mins  [address], OTR/L   Elouise JONELLE Bott 04/21/2023, 3:08 PM

## 2023-04-21 NOTE — Progress Notes (Signed)
  Echocardiogram 2D Echocardiogram has been performed.  Ocie Doyne RDCS 04/21/2023, 8:40 AM

## 2023-04-21 NOTE — Progress Notes (Signed)
 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 Presented 1/4 med [facility] with vertigo, nausea, unable to tolerate oral appears to be in mild DKA.  CT head no acute intracranial abnormality.  Subsequently had MRI which was positive for acute stroke     Assessment & Plan:   Principal Problem:   DKA (diabetic ketoacidosis) (HCC) Active Problems:   Chronic vertigo   Insulin  dependent type 2 diabetes mellitus (HCC)   Peripheral neuropathy   Essential hypertension   BPH (benign prostatic hyperplasia)   Hyperlipidemia   Acute kidney injury 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)  1-Acute right middle cerebellar peduncle stroke: Etiology of vessels disease CT head: No acute abnormality CTA head and neck no large vessel occlusion, 60% stenosis of the proximal left ICA and 50% stenosis of the origin of the left vertebral artery, 30% stenosis bilateral carotid artery. -MRI small acute infarct right middle cerebellar peduncle -Echo Normal ECHO -LDL 78 -A1c 12 -Plan to continue aspirin  and Plavix  for 3 weeks then Plavix  alone Therapy recommend CIR, hopefully transfer tomorrow.   Diabetes type 2 , uncontrolled, DKA on admission Continue 70/30 twice daily: Continue sliding scale.  We discussed  diet Hemoglobin A1c 12 Increase 70/30- to 19 units BID>   Hypertension: Continue amlodipine .  Hyperlipidemia: LDL 78 continue Lipitor  at discharge  Hypokalemia; replaced.   Estimated body mass  index is 30.45 kg/m as calculated from the following:   Height as of this encounter: 6' 1 (1.854 m).   Weight as of this encounter: 104.7 kg.   DVT prophylaxis: SCD Code Status: Full code Family Communication: wife at bedside 1/08.  Disposition Plan:  Status is: Observation The patient remains OBS appropriate and will d/c before 2 midnights.    Consultants:  Neurology   Procedures:  ECHO Antimicrobials:    Subjective: No new complaints other than would like regular diet.    Objective: Vitals:   04/21/23 0449 04/21/23 0637 04/21/23 0903 04/21/23 1156  BP: 138/61  (!) 152/75 (!) 148/77  Pulse: 78   90  Resp: 20 20 20 20   Temp: 98.2 F (36.8 C)  (!) 97.3 F (36.3 C) 98.4 F (36.9 C)  TempSrc: Oral  Oral Oral  SpO2: 98%     Weight:  104.7 kg    Height:        Intake/Output Summary (Last 24 hours) at 04/21/2023 1338 Last data filed at 04/21/2023 1300 Gross per 24 hour  Intake 1440 ml  Output 1200 ml  Net 240 ml   Filed Weights   04/19/23 0334 04/20/23 0625 04/21/23 0637  Weight: 104 kg 104.4 kg 104.7 kg    Examination:  General exam: NAD Respiratory system: CTA Cardiovascular system: S 1, S 2 RRR Gastrointestinal system: BS present, soft, nt Central nervous system: Alert, NAD Extremities: no edema    Data Reviewed: I have personally reviewed following labs and imaging studies  CBC: Recent Labs  Lab 04/16/23 1448 04/16/23 1555 04/17/23 0737 04/18/23 0243 04/19/23 0332 04/20/23 0402  WBC 9.3  --  11.6* 11.7* 8.2  7.7  HGB 12.7* 12.9* 11.7* 12.0* 12.0* 11.9*  HCT 39.4 38.0* 35.9* 37.3* 37.1* 36.9*  MCV 83.3  --  83.1 85.2 83.4 83.9  PLT 311  --  285 262 253 235   Basic Metabolic Panel: Recent Labs  Lab 04/17/23 0201 04/17/23 0737 04/18/23 0243 04/19/23 0332 04/20/23 0402  NA 137 140 139 137 134*  K 4.5 4.0 3.5 3.0* 3.4*  CL 100 102 106 103 104  CO2 22 25 23 23 22   GLUCOSE 310* 271* 158* 156* 239*  BUN 22 20 17 18 18   CREATININE 1.20  1.08 0.99 1.02 1.22  CALCIUM  8.7* 9.0 8.9 9.0 8.8*   GFR: Estimated Creatinine Clearance: 65.4 mL/min (by C-G formula based on SCr of 1.22 mg/dL). Liver Function Tests: Recent Labs  Lab 04/16/23 1448 04/17/23 0737 04/18/23 0243 04/19/23 0332 04/20/23 0402  AST 40 23 36 39 40  ALT 32 25 25 28  36  ALKPHOS 44 35* 40 40 40  BILITOT 1.2 1.0 1.0 1.3* 1.2  PROT 7.4 5.7* 5.7* 5.5* 5.2*  ALBUMIN 4.2 3.2* 3.0* 2.8* 2.8*   Recent Labs  Lab 04/16/23 1448  LIPASE 35   No results for input(s): AMMONIA in the last 168 hours. Coagulation Profile: No results for input(s): INR, PROTIME in the last 168 hours. Cardiac Enzymes: No results for input(s): CKTOTAL, CKMB, CKMBINDEX, TROPONINI in the last 168 hours. BNP (last 3 results) No results for input(s): PROBNP in the last 8760 hours. HbA1C: No results for input(s): HGBA1C in the last 72 hours. CBG: Recent Labs  Lab 04/20/23 1211 04/20/23 1651 04/20/23 2115 04/21/23 0647 04/21/23 1155  GLUCAP 330* 253* 242* 190* 276*   Lipid Profile: Recent Labs    04/21/23 0333  CHOL 100  HDL 22*  LDLCALC 54  TRIG 879  CHOLHDL 4.5   Thyroid  Function Tests: No results for input(s): TSH, T4TOTAL, FREET4, T3FREE, THYROIDAB in the last 72 hours. Anemia Panel: No results for input(s): VITAMINB12, FOLATE, FERRITIN, TIBC, IRON, RETICCTPCT in the last 72 hours. Sepsis Labs: No results for input(s): PROCALCITON, LATICACIDVEN in the last 168 hours.  No results found for this or any previous visit (from the past 240 hours).       Radiology Studies: ECHOCARDIOGRAM COMPLETE Result Date: 04/21/2023    ECHOCARDIOGRAM REPORT   Patient Name:   YANUEL TAGG Schnorr Date of Exam: 04/21/2023 Medical Rec #:  [id number]        Height:       73.0 in Accession #:    [id number]       Weight:       230.8 lb Date of Birth:  1945/08/17        BSA:          2.287 m Patient Age:    77 years         BP:           138/61 mmHg  Patient Gender: M                HR:           89 bpm. Exam Location:  Inpatient Procedure: 2D Echo, Cardiac Doppler, Color Doppler and Intracardiac            Opacification Agent Indications:    Stroke  History:        Patient has prior history of Echocardiogram examinations, most                 recent 11/14/2020.  Signs/Symptoms:Edema and Fatigue; Risk                 Factors:Hypertension, Diabetes and Dyslipidemia.  Sonographer:    Juanita Shaw Referring Phys: [id number] SRISHTI L BHAGAT  Sonographer Comments: Image acquisition challenging due to patient body habitus. IMPRESSIONS  1. Left ventricular ejection fraction, by estimation, is 60 to 65%. Left ventricular ejection fraction by 2D MOD biplane is 61.9 %. The left ventricle has normal function. The left ventricle has no regional wall motion abnormalities. Left ventricular diastolic parameters are consistent with Grade I diastolic dysfunction (impaired relaxation).  2. Right ventricular systolic function is normal. The right ventricular size is normal. There is normal pulmonary artery systolic pressure. The estimated right ventricular systolic pressure is 25.7 mmHg.  3. The mitral valve is grossly normal. No evidence of mitral valve regurgitation.  4. The aortic valve is tricuspid. Aortic valve regurgitation is not visualized. Aortic valve sclerosis/calcification is present, without any evidence of aortic stenosis. Comparison(s): No significant change from prior study. 11/14/2020: LVEF 60-65%. FINDINGS  Left Ventricle: Left ventricular ejection fraction, by estimation, is 60 to 65%. Left ventricular ejection fraction by 2D MOD biplane is 61.9 %. The left ventricle has normal function. The left ventricle has no regional wall motion abnormalities. The left ventricular internal cavity size was normal in size. There is no left ventricular hypertrophy. Left ventricular diastolic parameters are consistent with Grade I diastolic dysfunction (impaired relaxation).  Indeterminate filling pressures. Right Ventricle: The right ventricular size is normal. No increase in right ventricular wall thickness. Right ventricular systolic function is normal. There is normal pulmonary artery systolic pressure. The tricuspid regurgitant velocity is 2.38 m/s, and  with an assumed right atrial pressure of 3 mmHg, the estimated right ventricular systolic pressure is 25.7 mmHg. Left Atrium: Left atrial size was normal in size. Right Atrium: Right atrial size was normal in size. Pericardium: There is no evidence of pericardial effusion. Mitral Valve: The mitral valve is grossly normal. No evidence of mitral valve regurgitation. MV peak gradient, 5.9 mmHg. The mean mitral valve gradient is 2.0 mmHg. Tricuspid Valve: The tricuspid valve is grossly normal. Tricuspid valve regurgitation is trivial. Aortic Valve: The aortic valve is tricuspid. Aortic valve regurgitation is not visualized. Aortic valve sclerosis/calcification is present, without any evidence of aortic stenosis. Aortic valve mean gradient measures 3.0 mmHg. Aortic valve peak gradient measures 4.4 mmHg. Aortic valve area, by VTI measures 2.96 cm. Pulmonic Valve: The pulmonic valve was grossly normal. Pulmonic valve regurgitation is trivial. Aorta: The aortic root and ascending aorta are structurally normal, with no evidence of dilitation. IAS/Shunts: No atrial level shunt detected by color flow Doppler.  LEFT VENTRICLE PLAX 2D                        Biplane EF (MOD) LVIDd:         4.50 cm         LV Biplane EF:   Left LVIDs:         2.90 cm                          ventricular LV PW:         0.70 cm                          ejection LV IVS:        0.90 cm  fraction by LVOT diam:     2.00 cm                          2D MOD LV SV:         63                               biplane is LV SV Index:   27                               61.9 %. LVOT Area:     3.14 cm                                Diastology                                 LV e' medial:    5.59 cm/s LV Volumes (MOD)               LV E/e' medial:  12.1 LV vol d, MOD    88.1 ml       LV e' lateral:   8.70 cm/s A2C:                           LV E/e' lateral: 7.8 LV vol d, MOD    88.4 ml A4C: LV vol s, MOD    30.1 ml A2C: LV vol s, MOD    36.7 ml A4C: LV SV MOD A2C:   58.0 ml LV SV MOD A4C:   88.4 ml LV SV MOD BP:    55.5 ml RIGHT VENTRICLE RV Basal diam:  3.70 cm RV Mid diam:    2.20 cm RV S prime:     13.40 cm/s TAPSE (M-mode): 3.2 cm LEFT ATRIUM             Index        RIGHT ATRIUM           Index LA diam:        3.60 cm 1.57 cm/m   RA Area:     12.30 cm LA Vol (A2C):   36.9 ml 16.14 ml/m  RA Volume:   25.20 ml  11.02 ml/m LA Vol (A4C):   42.5 ml 18.59 ml/m LA Biplane Vol: 43.7 ml 19.11 ml/m  AORTIC VALVE                    PULMONIC VALVE AV Area (Vmax):    2.85 cm     PV Vmax:          0.95 m/s AV Area (Vmean):   2.59 cm     PV Peak grad:     3.6 mmHg AV Area (VTI):     2.96 cm     PR End Diast Vel: 1.08 msec AV Vmax:           105.00 cm/s AV Vmean:          77.400 cm/s AV VTI:            0.212 m AV Peak Grad:      4.4 mmHg AV Mean Grad:      3.0 mmHg  LVOT Vmax:         95.20 cm/s LVOT Vmean:        63.700 cm/s LVOT VTI:          0.200 m LVOT/AV VTI ratio: 0.94  AORTA Ao Root diam: 3.30 cm Ao Asc diam:  3.00 cm MITRAL VALVE                TRICUSPID VALVE MV Area (PHT): 3.51 cm     TR Peak grad:   22.7 mmHg MV Area VTI:   3.06 cm     TR Vmax:        238.00 cm/s MV Peak grad:  5.9 mmHg MV Mean grad:  2.0 mmHg     SHUNTS MV Vmax:       1.21 m/s     Systemic VTI:  0.20 m MV Vmean:      65.0 cm/s    Systemic Diam: 2.00 cm MV Decel Time: 216 msec MV E velocity: 67.60 cm/s MV A velocity: 103.00 cm/s MV E/A ratio:  0.66 Vinie Maxcy MD Electronically signed by Vinie Maxcy MD Signature Date/Time: 04/21/2023/11:05:34 AM    Final    CT ANGIO HEAD NECK W WO CM Result Date: 04/20/2023 CLINICAL DATA:  Stroke/TIA.  Determine embolic source. EXAM: CT ANGIOGRAPHY HEAD  AND NECK WITH AND WITHOUT CONTRAST TECHNIQUE: Multidetector CT imaging of the head and neck was performed using the standard protocol during bolus administration of intravenous contrast. Multiplanar CT image reconstructions and MIPs were obtained to evaluate the vascular anatomy. Carotid stenosis measurements (when applicable) are obtained utilizing NASCET criteria, using the distal internal carotid diameter as the denominator. RADIATION DOSE REDUCTION: This exam was performed according to the departmental dose-optimization program which includes automated exposure control, adjustment of the mA and/or kV according to patient size and/or use of iterative reconstruction technique. CONTRAST:  75mL OMNIPAQUE  IOHEXOL  350 MG/ML SOLN COMPARISON:  MRI yesterday. FINDINGS: CT HEAD FINDINGS Brain: Chronic small-vessel ischemic changes affect the pons. Small acute infarction of the middle cerebellar peduncle on the right cannot be specifically seen by CT. Cerebral hemispheres show chronic small-vessel ischemic changes of the white matter without evidence of an acute stroke. No mass, hemorrhage, hydrocephalus or extra-axial collection. Vascular: There is atherosclerotic calcification of the major vessels at the base of the brain. Skull: Negative Sinuses/Orbits: Clear/normal Other: None Review of the MIP images confirms the above findings CTA NECK FINDINGS Aortic arch: Aortic atherosclerosis.  Branching pattern is normal. Right carotid system: Common carotid artery widely patent to the bifurcation. Minimal atherosclerotic plaque at the bifurcation and ICA bulb but no stenosis. Cervical ICA widely patent. Left carotid system: Common carotid artery widely patent to the bifurcation. Calcified plaque at the bifurcation and proximal ICA bulb. Minimal diameter of the proximal ICA is 1.75 mm. Compared to a more distal cervical ICA diameter of 4.2 mm, this indicates a 60% stenosis. Vertebral arteries: No proximal subclavian stenosis.  Right vertebral artery origin is widely patent. The vessel appears normal through the cervical region to the foramen magnum. There is soft and calcified plaque at the left vertebral artery origin with a 50% stenosis. Beyond that, the vessel is patent through the cervical region to the foramen magnum. Skeleton: Minimal spondylosis. Other neck: No mass or lymphadenopathy. Upper chest: Chronic scarring in the anterior right upper lobe. Review of the MIP images confirms the above findings CTA HEAD FINDINGS Anterior circulation: Both internal carotid arteries are patent through the skull base and siphon regions. There  is siphon atherosclerotic calcification with maximal stenosis estimated at 30% on both sides. The anterior and middle cerebral vessels are patent. No large vessel occlusion or flow limiting proximal stenosis. No aneurysm or vascular malformation. Posterior circulation: Both vertebral arteries are widely patent to the basilar artery. No basilar stenosis. Posterior circulation branch vessels are patent. There is moderate long segment stenosis of the proximal right PCA, but the vessel does show persistent flow. Venous sinuses: Patent and normal. Anatomic variants: None significant. Review of the MIP images confirms the above findings IMPRESSION: 1. No large vessel occlusion or flow limiting proximal intracranial stenosis. 2. 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 3. 50% stenosis of the left vertebral artery origin due to soft and calcified plaque. 4. 30% stenosis of both internal carotid artery siphon regions due to calcified plaque. 5. Moderate long segment stenosis of the proximal right PCA, but the vessel does show persistent flow. 6. Aortic atherosclerosis. 7. Chronic small-vessel ischemic changes of the pons and cerebral hemispheric white matter. Small acute infarction of the middle cerebellar peduncle on the right shown by MRI cannot be specifically seen by CT. Aortic  Atherosclerosis (ICD10-I70.0). Electronically Signed   By: Oneil Officer M.D.   On: 04/20/2023 11:22   MR BRAIN W WO CONTRAST Result Date: 04/19/2023 CLINICAL DATA:  Vertigo EXAM: MRI HEAD WITHOUT AND WITH CONTRAST TECHNIQUE: Multiplanar, multiecho pulse sequences of the brain and surrounding structures were obtained without and with intravenous contrast. CONTRAST:  10mL GADAVIST  GADOBUTROL  1 MMOL/ML IV SOLN COMPARISON:  11/08/2022 MRI head, 04/16/2023 CT head FINDINGS: Brain: Small area of restricted diffusion with ADC correlate in the right middle cerebellar peduncle (series 2, image 13 and 14; series 3, image 14). This is associated with a small amount of increased T2 hyperintense signal. No acute hemorrhage, mass, mass effect, or midline shift. No hydrocephalus or extra-axial collection. Pituitary and craniocervical junction within normal limits. Focus of hemosiderin deposition in the pons, likely sequela prior hypertensive microhemorrhage. Remote lacunar infarcts in the right lentiform nucleus, bilateral thalami, and right pons. Advanced cerebral atrophy for age. Confluent T2 hyperintense signal in the periventricular white matter, likely the sequela of moderate chronic small vessel ischemic disease. Dilated perivascular spaces in the basal ganglia and cortex. Vascular: Normal arterial flow voids. Skull and upper cervical spine: Normal marrow signal. Sinuses/Orbits: Clear paranasal sinuses. No acute finding in the orbits. Status post bilateral lens replacements. Other: Fluid in the right mastoid air cells. IMPRESSION: Small acute infarct in the right middle cerebellar peduncle. Electronically Signed   By: Donald Campion M.D.   On: 04/19/2023 19:40        Scheduled Meds:  amLODipine   5 mg Oral Daily   aspirin  EC  81 mg Oral Daily   atorvastatin   80 mg Oral QHS   clopidogrel   75 mg Oral Daily   finasteride   5 mg Oral Daily   insulin  aspart  0-9 Units Subcutaneous TID WC   insulin  aspart protamine-  aspart  17 Units Subcutaneous BID WC   pantoprazole   40 mg Oral Daily   polyethylene glycol  17 g Oral BID   potassium chloride   40 mEq Oral Daily   sodium chloride  flush  3 mL Intravenous Q12H   tamsulosin   0.4 mg Oral Daily   Continuous Infusions:   LOS: 1 day    Time spent: 35 minutes    Rigley Niess A Harbour Nordmeyer, MD [facility]   If 7PM-7AM, please contact night-coverage [email or web address]  04/21/2023,  1:38 PM

## 2023-04-21 NOTE — Progress Notes (Signed)
 Physical Therapy Treatment Patient Details Name: John [name] MRN: [id number] DOB: Aug 25, 1945 Today's Date: 04/21/2023   History of Present Illness Pt is a 78 y.o. male who presented 04/16/23 with dizziness and nausea. CT head no acute intracranial finding. MRI brain pending. Pt also noted to have hyperglycemic crisis/early development of DKA and acute kidney injury. PMH: chronic vertigo, insulin -dependent DM2, HTN, HLD, peripheral neuropathy, breast cancer, carpal tunnel syndrome    PT Comments  Pt received in bed, able to come to EOB with supervision and increased time from [facility] elevated. Pt relays dizziness with changes in position but no nausea. Pt required mod A for ambulation due to R sided bias throughout gait, occasional stagger, and difficulty turning within RW. Pt also with occasional posterior LOB when coming to full erect posture. Worked on dynamic balance in sitting and standing after ambulation. Wife present end of session. Patient will benefit from intensive inpatient follow up therapy, >3 hours/day. PT will continue to follow.     If plan is discharge home, recommend the following: A lot of help with walking and/or transfers;A little help with bathing/dressing/bathroom;Assist for transportation;Assistance with cooking/housework;Help with stairs or ramp for entrance   Can travel by private vehicle        Equipment Recommendations  Wheelchair (measurements PT) (transport wheelchair)    Recommendations for Other Services Rehab consult     Precautions / Restrictions Precautions Precautions: Fall Precaution Comments: R BPPV vs hypofunction vs central cause Restrictions Weight Bearing Restrictions Per Provider Order: No     Mobility  Bed Mobility Overal bed mobility: Needs Assistance Bed Mobility: Supine to Sit     Supine to sit: Contact guard     General bed mobility comments: pt able to rise from [facility] elevated position. Increased time needed and pt reports mild  dizziness with coming to sit    Transfers Overall transfer level: Needs assistance Equipment used: Rolling walker (2 wheels) Transfers: Sit to/from Stand Sit to Stand: Mod assist           General transfer comment: mod A needed due to post bias. Worked on many sit>stand and pt able to come to standing with min A at times from recliner but not consistent    Ambulation/Gait Ambulation/Gait assistance: Mod assist Gait Distance (Feet): 70 Feet Assistive device: Rolling walker (2 wheels) Gait Pattern/deviations: Step-through pattern, Decreased stride length, Staggering right, Narrow base of support Gait velocity: dec Gait velocity interpretation: <1.31 ft/sec, indicative of household ambulator   General Gait Details: mod A needed due to R lean as well as occasionally stepping out of RW with R foot and kicking RW. Balance very poor with changing direction and pt has diffciulty problem solving which way to turn.   Stairs             Wheelchair Mobility     Tilt Bed    Modified Rankin (Stroke Patients Only) Modified Rankin (Stroke Patients Only) Pre-Morbid Rankin Score: Slight disability Modified Rankin: Moderately severe disability     Balance Overall balance assessment: Needs assistance Sitting-balance support: Feet supported Sitting balance-Leahy Scale: Fair Sitting balance - Comments: Posterior lean Postural control: Posterior lean Standing balance support: Bilateral upper extremity supported Standing balance-Leahy Scale: Poor Standing balance comment: UE support for balance, and physical assist to prevent fall backwards               High Level Balance Comments: worked on seated balance with pt throwing and catching small bottle. Pt had diffciulty grading his  effort as well as coordinating B hands to catch. In standing, pt needed mod A during dynamic activity, worked on reaching in all planes. Pt very hesitant to reach fwd out of BOS.             Cognition Arousal: Alert Behavior During Therapy: WFL for tasks assessed/performed Overall Cognitive Status: Within Functional Limits for tasks assessed                                 General Comments: pt able to verbalize that he had a CVA but unable to recall where in his brain or that he had been told this (which he had by last therapist). Reviewed this again and how it correlates to his symptoms        Exercises      General Comments General comments (skin integrity, edema, etc.): VSS. Wife present end of session      Pertinent Vitals/Pain Pain Assessment Pain Assessment: No/denies pain    Home Living                          Prior Function            PT Goals (current goals can now be found in the care plan section) Acute Rehab PT Goals Patient Stated Goal: to know what is going on and improve PT Goal Formulation: With patient Time For Goal Achievement: 05/01/23 Potential to Achieve Goals: Fair Progress towards PT goals: Progressing toward goals    Frequency    Min 1X/week      PT Plan      Co-evaluation              AM-PAC PT 6 Clicks Mobility   Outcome Measure  Help needed turning from your back to your side while in a flat bed without using bedrails?: A Lot Help needed moving from lying on your back to sitting on the side of a flat bed without using bedrails?: A Little Help needed moving to and from a bed to a chair (including a wheelchair)?: A Lot Help needed standing up from a chair using your arms (e.g., wheelchair or bedside chair)?: A Lot Help needed to walk in hospital room?: A Lot Help needed climbing 3-5 steps with a railing? : Total 6 Click Score: 12    End of Session Equipment Utilized During Treatment: Gait belt Activity Tolerance: Patient tolerated treatment well Patient left: with call bell/phone within reach;in chair;with chair alarm set;with family/visitor present Nurse Communication: Mobility  status PT Visit Diagnosis: Other abnormalities of gait and mobility (R26.89);Difficulty in walking, not elsewhere classified (R26.2);Other symptoms and signs involving the nervous system (R29.898);Dizziness and giddiness (R42) BPPV - Right/Left : Right     Time: 8847-8758 PT Time Calculation (min) (ACUTE ONLY): 49 min  Charges:    $Gait Training: 23-37 mins $Therapeutic Exercise: 8-22 mins PT General Charges $$ ACUTE PT VISIT: 1 Visit                     Richerd Lipoma, PT  Acute Rehab Services Secure chat preferred Office [phone]    Richerd CROME Tamela Elsayed 04/21/2023, 2:33 PM

## 2023-04-22 ENCOUNTER — Other Ambulatory Visit: Payer: Self-pay

## 2023-04-22 ENCOUNTER — Inpatient Hospital Stay (HOSPITAL_COMMUNITY)
Admission: AD | Admit: 2023-04-22 | Discharge: 2023-05-07 | DRG: 057 | Disposition: A | Discharge status: Home Health | Payer: Medicare Other | Source: Intra-hospital | Attending: Physical Medicine & Rehabilitation | Admitting: Physical Medicine & Rehabilitation

## 2023-04-22 DIAGNOSIS — Z79810 Long term (current) use of selective estrogen receptor modulators (SERMs): Secondary | ICD-10-CM | POA: Diagnosis not present

## 2023-04-22 DIAGNOSIS — Z7982 Long term (current) use of aspirin: Secondary | ICD-10-CM

## 2023-04-22 DIAGNOSIS — I63511 Cerebral infarction due to unspecified occlusion or stenosis of right middle cerebral artery: Secondary | ICD-10-CM | POA: Diagnosis not present

## 2023-04-22 DIAGNOSIS — Z833 Family history of diabetes mellitus: Secondary | ICD-10-CM

## 2023-04-22 DIAGNOSIS — L89321 Pressure ulcer of left buttock, stage 1: Secondary | ICD-10-CM | POA: Diagnosis not present

## 2023-04-22 DIAGNOSIS — K219 Gastro-esophageal reflux disease without esophagitis: Secondary | ICD-10-CM | POA: Diagnosis not present

## 2023-04-22 DIAGNOSIS — K5900 Constipation, unspecified: Secondary | ICD-10-CM | POA: Diagnosis not present

## 2023-04-22 DIAGNOSIS — Z9221 Personal history of antineoplastic chemotherapy: Secondary | ICD-10-CM | POA: Diagnosis not present

## 2023-04-22 DIAGNOSIS — E7800 Pure hypercholesterolemia, unspecified: Secondary | ICD-10-CM | POA: Diagnosis not present

## 2023-04-22 DIAGNOSIS — Z7984 Long term (current) use of oral hypoglycemic drugs: Secondary | ICD-10-CM

## 2023-04-22 DIAGNOSIS — E1122 Type 2 diabetes mellitus with diabetic chronic kidney disease: Secondary | ICD-10-CM | POA: Diagnosis present

## 2023-04-22 DIAGNOSIS — I6389 Other cerebral infarction: Secondary | ICD-10-CM | POA: Insufficient documentation

## 2023-04-22 DIAGNOSIS — B9781 Human metapneumovirus as the cause of diseases classified elsewhere: Secondary | ICD-10-CM | POA: Diagnosis not present

## 2023-04-22 DIAGNOSIS — I129 Hypertensive chronic kidney disease with stage 1 through stage 4 chronic kidney disease, or unspecified chronic kidney disease: Secondary | ICD-10-CM | POA: Diagnosis present

## 2023-04-22 DIAGNOSIS — Z79899 Other long term (current) drug therapy: Secondary | ICD-10-CM | POA: Diagnosis not present

## 2023-04-22 DIAGNOSIS — R739 Hyperglycemia, unspecified: Secondary | ICD-10-CM | POA: Diagnosis not present

## 2023-04-22 DIAGNOSIS — J069 Acute upper respiratory infection, unspecified: Secondary | ICD-10-CM | POA: Diagnosis not present

## 2023-04-22 DIAGNOSIS — E669 Obesity, unspecified: Secondary | ICD-10-CM | POA: Diagnosis not present

## 2023-04-22 DIAGNOSIS — N179 Acute kidney failure, unspecified: Secondary | ICD-10-CM

## 2023-04-22 DIAGNOSIS — N182 Chronic kidney disease, stage 2 (mild): Secondary | ICD-10-CM | POA: Diagnosis not present

## 2023-04-22 DIAGNOSIS — E1142 Type 2 diabetes mellitus with diabetic polyneuropathy: Secondary | ICD-10-CM | POA: Diagnosis not present

## 2023-04-22 DIAGNOSIS — Z794 Long term (current) use of insulin: Secondary | ICD-10-CM | POA: Diagnosis not present

## 2023-04-22 DIAGNOSIS — N189 Chronic kidney disease, unspecified: Secondary | ICD-10-CM

## 2023-04-22 DIAGNOSIS — I69398 Other sequelae of cerebral infarction: Secondary | ICD-10-CM | POA: Diagnosis not present

## 2023-04-22 DIAGNOSIS — Z7902 Long term (current) use of antithrombotics/antiplatelets: Secondary | ICD-10-CM | POA: Diagnosis not present

## 2023-04-22 DIAGNOSIS — N400 Enlarged prostate without lower urinary tract symptoms: Secondary | ICD-10-CM | POA: Diagnosis present

## 2023-04-22 DIAGNOSIS — Z823 Family history of stroke: Secondary | ICD-10-CM

## 2023-04-22 DIAGNOSIS — Z807 Family history of other malignant neoplasms of lymphoid, hematopoietic and related tissues: Secondary | ICD-10-CM

## 2023-04-22 DIAGNOSIS — L8990 Pressure ulcer of unspecified site, unspecified stage: Secondary | ICD-10-CM | POA: Insufficient documentation

## 2023-04-22 DIAGNOSIS — I10 Essential (primary) hypertension: Secondary | ICD-10-CM | POA: Diagnosis not present

## 2023-04-22 DIAGNOSIS — R052 Subacute cough: Secondary | ICD-10-CM | POA: Diagnosis not present

## 2023-04-22 DIAGNOSIS — R42 Dizziness and giddiness: Secondary | ICD-10-CM | POA: Diagnosis present

## 2023-04-22 DIAGNOSIS — I951 Orthostatic hypotension: Secondary | ICD-10-CM | POA: Diagnosis present

## 2023-04-22 DIAGNOSIS — E1100 Type 2 diabetes mellitus with hyperosmolarity without nonketotic hyperglycemic-hyperosmolar coma (NKHHC): Secondary | ICD-10-CM | POA: Diagnosis not present

## 2023-04-22 DIAGNOSIS — E1169 Type 2 diabetes mellitus with other specified complication: Secondary | ICD-10-CM | POA: Diagnosis not present

## 2023-04-22 DIAGNOSIS — F419 Anxiety disorder, unspecified: Secondary | ICD-10-CM | POA: Diagnosis not present

## 2023-04-22 DIAGNOSIS — R059 Cough, unspecified: Secondary | ICD-10-CM | POA: Diagnosis not present

## 2023-04-22 DIAGNOSIS — L89301 Pressure ulcer of unspecified buttock, stage 1: Secondary | ICD-10-CM | POA: Diagnosis not present

## 2023-04-22 DIAGNOSIS — F32A Depression, unspecified: Secondary | ICD-10-CM | POA: Diagnosis not present

## 2023-04-22 DIAGNOSIS — G629 Polyneuropathy, unspecified: Secondary | ICD-10-CM | POA: Diagnosis not present

## 2023-04-22 DIAGNOSIS — Z8041 Family history of malignant neoplasm of ovary: Secondary | ICD-10-CM

## 2023-04-22 DIAGNOSIS — K5901 Slow transit constipation: Secondary | ICD-10-CM | POA: Diagnosis not present

## 2023-04-22 DIAGNOSIS — Z6830 Body mass index (BMI) 30.0-30.9, adult: Secondary | ICD-10-CM | POA: Diagnosis not present

## 2023-04-22 DIAGNOSIS — Z809 Family history of malignant neoplasm, unspecified: Secondary | ICD-10-CM

## 2023-04-22 DIAGNOSIS — Z853 Personal history of malignant neoplasm of breast: Secondary | ICD-10-CM

## 2023-04-22 DIAGNOSIS — F54 Psychological and behavioral factors associated with disorders or diseases classified elsewhere: Secondary | ICD-10-CM | POA: Diagnosis not present

## 2023-04-22 LAB — BASIC METABOLIC PANEL
Anion gap: 13 (ref 5–15)
BUN: 10 mg/dL (ref 8–23)
CO2: 22 mmol/L (ref 22–32)
Calcium: 9.2 mg/dL (ref 8.9–10.3)
Chloride: 102 mmol/L (ref 98–111)
Creatinine, Ser: 0.95 mg/dL (ref 0.61–1.24)
GFR, Estimated: >60 mL/min (ref >60)
Glucose, Bld: 223 mg/dL — ABNORMAL HIGH (ref 70–99)
Potassium: 4.4 mmol/L (ref 3.5–5.1)
Sodium: 137 mmol/L (ref 135–145)

## 2023-04-22 LAB — GLUCOSE, CAPILLARY
Glucose-Capillary: 194 mg/dL — ABNORMAL HIGH (ref 70–99)
Glucose-Capillary: 263 mg/dL — ABNORMAL HIGH (ref 70–99)
Glucose-Capillary: 235 mg/dL — ABNORMAL HIGH (ref 70–99)
Glucose-Capillary: 245 mg/dL — ABNORMAL HIGH (ref 70–99)

## 2023-04-22 MED ORDER — ACETAMINOPHEN 325 MG PO TABS
650.0000 mg | ORAL_TABLET | Freq: Four times a day (QID) | ORAL | Status: DC | PRN
  Administered 2023-04-27 – 2023-05-06 (×7): 650 mg via ORAL
  Filled 2023-04-22 (×9): qty 2

## 2023-04-22 MED ORDER — ASPIRIN 81 MG PO TBEC: 81.0000 mg | DELAYED_RELEASE_TABLET | Freq: Every day | ORAL | 12 refills | Status: DC

## 2023-04-22 MED ORDER — CLOPIDOGREL BISULFATE 75 MG PO TABS
75.0000 mg | ORAL_TABLET | Freq: Every day | ORAL | Status: DC
  Administered 2023-04-23 – 2023-05-07 (×15): 75 mg via ORAL
  Filled 2023-04-22 (×15): qty 1

## 2023-04-22 MED ORDER — INSULIN ASPART PROT & ASPART (70-30 MIX) 100 UNIT/ML ~~LOC~~ SUSP
19.0000 [IU] | Freq: Two times a day (BID) | SUBCUTANEOUS | Status: DC
  Administered 2023-04-22 – 2023-04-25 (×6): 19 [IU] via SUBCUTANEOUS
  Filled 2023-04-22 (×2): qty 10

## 2023-04-22 MED ORDER — TRIAMCINOLONE 0.1 % CREAM:EUCERIN CREAM 1:1: 1.0000 | TOPICAL_CREAM | Freq: Two times a day (BID) | CUTANEOUS | 0 refills | Status: DC

## 2023-04-22 MED ORDER — TRIAMCINOLONE 0.1 % CREAM:EUCERIN CREAM 1:1
TOPICAL_CREAM | Freq: Two times a day (BID) | CUTANEOUS | Status: DC
  Filled 2023-04-22 (×2): qty 1

## 2023-04-22 MED ORDER — CLOPIDOGREL BISULFATE 75 MG PO TABS: 75.0000 mg | ORAL_TABLET | Freq: Every day | ORAL | 3 refills | Status: DC

## 2023-04-22 MED ORDER — POTASSIUM CHLORIDE CRYS ER 20 MEQ PO TBCR
40.0000 meq | EXTENDED_RELEASE_TABLET | Freq: Once | ORAL | Status: AC
Start: 2023-04-22 — End: 2023-04-22
  Administered 2023-04-22: 40 meq via ORAL
  Filled 2023-04-22: qty 2

## 2023-04-22 MED ORDER — PANTOPRAZOLE SODIUM 40 MG PO TBEC
40.0000 mg | DELAYED_RELEASE_TABLET | Freq: Every day | ORAL | Status: DC
  Administered 2023-04-23 – 2023-05-07 (×15): 40 mg via ORAL
  Filled 2023-04-22 (×15): qty 1

## 2023-04-22 MED ORDER — AMLODIPINE BESYLATE 5 MG PO TABS
5.0000 mg | ORAL_TABLET | Freq: Every day | ORAL | Status: DC
  Administered 2023-04-23 – 2023-04-25 (×3): 5 mg via ORAL
  Filled 2023-04-22 (×3): qty 1

## 2023-04-22 MED ORDER — FINASTERIDE 5 MG PO TABS
5.0000 mg | ORAL_TABLET | Freq: Every day | ORAL | Status: DC
  Administered 2023-04-23 – 2023-05-07 (×15): 5 mg via ORAL
  Filled 2023-04-22 (×15): qty 1

## 2023-04-22 MED ORDER — ATORVASTATIN CALCIUM 80 MG PO TABS: 80.0000 mg | ORAL_TABLET | Freq: Every day | ORAL | 3 refills | Status: DC

## 2023-04-22 MED ORDER — POLYETHYLENE GLYCOL 3350 17 G PO PACK
17.0000 g | PACK | Freq: Two times a day (BID) | ORAL | Status: DC
  Administered 2023-04-22 – 2023-05-02 (×2): 17 g via ORAL
  Filled 2023-04-22 (×16): qty 1

## 2023-04-22 MED ORDER — POTASSIUM CHLORIDE CRYS ER 20 MEQ PO TBCR: 40.0000 meq | EXTENDED_RELEASE_TABLET | Freq: Every day | ORAL | 0 refills | Status: DC

## 2023-04-22 MED ORDER — INSULIN ASPART PROT & ASPART (70-30 MIX) 100 UNIT/ML ~~LOC~~ SUSP: 19.0000 [IU] | Freq: Two times a day (BID) | SUBCUTANEOUS | 11 refills | Status: DC

## 2023-04-22 MED ORDER — POLYETHYLENE GLYCOL 3350 17 G PO PACK: 17.0000 g | PACK | Freq: Two times a day (BID) | ORAL | 0 refills | Status: DC

## 2023-04-22 MED ORDER — ATORVASTATIN CALCIUM 80 MG PO TABS
80.0000 mg | ORAL_TABLET | Freq: Every day | ORAL | Status: DC
  Administered 2023-04-22 – 2023-05-06 (×15): 80 mg via ORAL
  Filled 2023-04-22 (×15): qty 1

## 2023-04-22 MED ORDER — ASPIRIN 81 MG PO TBEC
81.0000 mg | DELAYED_RELEASE_TABLET | Freq: Every day | ORAL | Status: DC
  Administered 2023-04-23 – 2023-05-07 (×15): 81 mg via ORAL
  Filled 2023-04-22 (×15): qty 1

## 2023-04-22 MED ORDER — MECLIZINE HCL 25 MG PO TABS
12.5000 mg | ORAL_TABLET | Freq: Three times a day (TID) | ORAL | Status: DC | PRN
  Administered 2023-04-26 – 2023-05-02 (×3): 12.5 mg via ORAL
  Filled 2023-04-22 (×3): qty 1

## 2023-04-22 MED ORDER — TAMSULOSIN HCL 0.4 MG PO CAPS
0.4000 mg | ORAL_CAPSULE | Freq: Every day | ORAL | Status: DC
  Administered 2023-04-23 – 2023-05-07 (×15): 0.4 mg via ORAL
  Filled 2023-04-22 (×15): qty 1

## 2023-04-22 MED ORDER — ASPIRIN 81 MG PO TBEC: 81.0000 mg | DELAYED_RELEASE_TABLET | Freq: Every day | ORAL | Status: DC

## 2023-04-22 MED ORDER — FUROSEMIDE 40 MG PO TABS
40.0000 mg | ORAL_TABLET | Freq: Every day | ORAL | Status: DC
Start: 2023-04-22 — End: 2023-04-22
  Administered 2023-04-22: 40 mg via ORAL
  Filled 2023-04-22: qty 1

## 2023-04-22 MED ORDER — ACETAMINOPHEN 650 MG RE SUPP: 650.0000 mg | Freq: Four times a day (QID) | RECTAL | Status: DC | PRN

## 2023-04-22 MED ORDER — SORBITOL 70 % SOLN
30.0000 mL | Freq: Once | Status: AC
  Administered 2023-04-22: 30 mL via ORAL
  Filled 2023-04-22: qty 30

## 2023-04-22 MED ORDER — INSULIN ASPART 100 UNIT/ML IJ SOLN
0.0000 [IU] | Freq: Three times a day (TID) | INTRAMUSCULAR | Status: DC
Start: 2023-04-22 — End: 2023-04-30
  Administered 2023-04-22 – 2023-04-23 (×3): 3 [IU] via SUBCUTANEOUS
  Administered 2023-04-23: 5 [IU] via SUBCUTANEOUS
  Administered 2023-04-24: 3 [IU] via SUBCUTANEOUS
  Administered 2023-04-24: 5 [IU] via SUBCUTANEOUS
  Administered 2023-04-24 – 2023-04-25 (×4): 3 [IU] via SUBCUTANEOUS
  Administered 2023-04-26 – 2023-04-27 (×5): 2 [IU] via SUBCUTANEOUS
  Administered 2023-04-27 – 2023-04-28 (×2): 1 [IU] via SUBCUTANEOUS
  Administered 2023-04-28: 2 [IU] via SUBCUTANEOUS
  Administered 2023-04-28 – 2023-04-29 (×2): 1 [IU] via SUBCUTANEOUS

## 2023-04-22 NOTE — Progress Notes (Addendum)
 Inpatient Rehabilitation Admission Medication Review by a Pharmacist  A complete drug regimen review was completed for this patient to identify any potential clinically significant medication issues.  High Risk Drug Classes Is patient taking? Indication by Medication  Antipsychotic No   Anticoagulant No   Antibiotic No   Opioid No   Antiplatelet Yes Aspirin  81 mg, clopidogrel  x 3 wks (begun 04/20/23), then clopidogrel  alone - CVA prophylaxis   Hypoglycemics/insulin  Yes Insulin  aspart  and 70/30 - IDDM Type 2  Vasoactive Medication Yes Amlodipine  - hypertension  Chemotherapy No   Other Yes Atorvastatin  - hyperlipidemia Finasteride , Tamsulosin  - BPH Pantoprazole  - GERD Miralax  - laxative Triamcinolone /Eucerin cream - rash on back  PRNs: Acetaminophen  - mild pain or fever >/= 101 Meclizine  - dizziness     Type of Medication Issue Identified Description of Issue Recommendation(s)  Drug Interaction(s) (clinically significant)     Duplicate Therapy     Allergy     No Medication Administration End Date  Aspirin  and clopidogrel  x 3 weeks, then clopidogrel  alone. Clopidogrel  load given 1/8, then 75 mg daily begun 1//9.  21 days = 05/10/23.  Incorrect Dose     Additional Drug Therapy Needed     Significant med changes from prior encounter (inform family/care partners about these prior to discharge). Aspirin  dose decreased, and to stop after 21 days with Clopidogrel . Atorvastatin  dose increased.  Toujeo  and Zinc  discontinued. Communicate changes with patient/family prior to discharge.  Other  Resume per DC summary: - Metformin  XR - Furosemide  - Tamoxifen  - Vitamin C      Clinically significant medication issues were identified that warrant physician communication and completion of prescribed/recommended actions by midnight of the next day:  No  Name of provider notified for urgent issues identified:   Provider Method of Notification:     Pharmacist comments:   DC summary  notes resume but not ordered yet:     - Metformin  XR > on SSI and 70/30     - Furosemide  80 mg daily, but resumed with 40 mg PO daily on 1/10 inpatient     - Tamoxifen  20 mg daily - f/u plan     - Vitamin C  - ok off for now, resume at some point  Time spent performing this drug regimen review (minutes):  20   Genaro Zebedee Calin 04/22/2023 4:11 PM

## 2023-04-22 NOTE — Progress Notes (Signed)
 Urbano Albright, MD  Physician Physical Medicine and Rehabilitation   Consult Note    Signed   Date of Service: 04/20/2023 12:36 PM  Related encounter: ED to Hosp-Admission (Current) from 04/16/2023 in [facility] 4E CV SURGICAL PROGRESSIVE CARE   Signed     Expand All Collapse All  Show:Clear all [x] Written[x] Templated[] Copied  Added by: [x] Urbano Albright, MD  [] Hover for details          Physical Medicine and Rehabilitation Consult Reason for Consult:Rehab Referring Physician: Dr. Franky     HPI: John [name] is a 78 y.o. male with past medical history of chronic vertigo, diabetes mellitus type 2, hypertension, hyperlipidemia, peripheral neuropathy, breast cancer s/p modified radical mastectomy and chemo.  Patient follows with ENT for vertigo.  Followed by ENT for vertigo, tried vestibular rehab but did not tolerate.  Patient was also treated for mild DKA.  CT head without acute changes but did show chronic ischemic small vessel disease.  MRI was delayed by ability to lay flat.  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  CTA head and neck with 60% stenosis proximal left ICA, 50% stenosis left vertebral artery origin, 30% stenosis of both internal carotid artery siphon regions moderate long segment stenosis proximal right PCA. Patient was seen by neurology started on Plavix  and ASA reduced to 81 mg from home 325 mg dose.  Patient was seen by physical therapy and Occupational Therapy and found to have functional deficits.   Patient lives in a two-story home with 3-1/2 steps to enter, 14 steps to get to the second floor.  He can stay on the first floor if needed.  Wife can assist after discharge     Review of Systems  Constitutional:  Negative for chills and fever.  HENT:  Positive for hearing loss (chronic).   Eyes:  Negative for blurred vision and double vision.  Respiratory:  Negative for shortness of breath.    Cardiovascular:  Negative for chest pain.  Gastrointestinal:  Positive for constipation. Negative for abdominal pain, diarrhea, nausea and vomiting.       Occasional incontinence since chemotherapy  Genitourinary:  Positive for urgency.  Musculoskeletal:  Negative for joint pain.  Skin:  Negative for rash.  Neurological:  Positive for dizziness and sensory change (chronic neuropathy). Negative for speech change, focal weakness and headaches.        Past Medical History:  Diagnosis Date   BPH (benign prostatic hyperplasia)     CTS (carpal tunnel syndrome)     Depression     Diabetes mellitus     Essential hypertension     GERD (gastroesophageal reflux disease)     History of breast cancer     Hypercholesteremia     Neuropathy               Past Surgical History:  Procedure Laterality Date   CATARACT EXTRACTION, BILATERAL   01/18/2017   LITHOTRIPSY       MASTECTOMY Right 05/02/2018   TONSILLECTOMY AND ADENOIDECTOMY                 Family History  Problem Relation Age of Onset   Lymphoma Mother     Cancer Mother     Diabetes Father     Stroke Father     Ovarian cancer Sister     Cancer Sister     Cancer Paternal Grandmother          Social  History:  reports that he has never smoked. He has never used smokeless tobacco. He reports that he does not drink alcohol and does not use drugs. Allergies:  Allergies       Allergies  Allergen Reactions   Ramipril Anaphylaxis   Other Diarrhea      Severe intolerance to Chemotherapy in the past.   Sertraline Other (See Comments)      Extreme headaches   Adhesive [Tape] Rash            Medications Prior to Admission  Medication Sig Dispense Refill   acetaminophen  (TYLENOL ) 650 MG CR tablet Take 650-1,300 mg by mouth every 8 (eight) hours as needed for pain.       amLODipine  (NORVASC ) 5 MG tablet TAKE 1 TABLET(5 MG) BY MOUTH DAILY (Patient taking differently: Take 5 mg by mouth daily.) 90 tablet 3   Ascorbic Acid   (VITAMIN C ) 1000 MG tablet Take 1,000 mg by mouth 3 (three) times a week.       aspirin  325 MG EC tablet Take 325 mg by mouth at bedtime.       finasteride  (PROSCAR ) 5 MG tablet Take 5 mg by mouth daily.       furosemide  (LASIX ) 80 MG tablet Take 1 tablet (80 mg total) by mouth daily. 90 tablet 1   insulin  lispro (HUMALOG  KWIKPEN) 200 UNIT/ML KwikPen Inject 25 Units into the skin 3 (three) times daily before meals. (Patient taking differently: Inject 25 Units into the skin in the morning.) 15 mL 2   metFORMIN  (GLUCOPHAGE -XR) 500 MG 24 hr tablet Take 2 tablets (1,000 mg total) by mouth 2 (two) times daily with a meal. 360 tablet 3   methocarbamol  (ROBAXIN ) 500 MG tablet TAKE 1 TABLET(500 MG) BY MOUTH EVERY 8 HOURS AS NEEDED FOR MUSCLE SPASMS (Patient taking differently: Take 500 mg by mouth every 8 (eight) hours as needed for muscle spasms.) 30 tablet 0   Multiple Vitamin (MULTIVITAMIN) tablet Take 1 tablet by mouth 2 (two) times a week.       nystatin  cream (MYCOSTATIN ) Apply 1 Application topically 2 (two) times daily. (Patient taking differently: Apply 1 Application topically 2 (two) times daily as needed (as directed- to affected areas of the penis and anus).) 30 g 0   omeprazole  (PRILOSEC) 40 MG capsule TAKE 1 CAPSULE(40 MG) BY MOUTH DAILY (Patient taking differently: Take 40 mg by mouth daily before breakfast.) 90 capsule 3   tamoxifen  (NOLVADEX ) 20 MG tablet Take 20 mg by mouth daily.       tamsulosin  (FLOMAX ) 0.4 MG CAPS capsule Take 0.4 mg by mouth daily.       TOUJEO  MAX SOLOSTAR 300 UNIT/ML Solostar Pen Take 15 u in the morning and 70 u in the evening. (Patient taking differently: 45 Units See admin instructions. Inject 45 units into the skin before breakfast and supper)       Accu-Chek Softclix Lancets lancets Use daily to check blood sugar.  DX E11.69 100 each 3   atorvastatin  (LIPITOR ) 40 MG tablet Take 1 tablet (40 mg total) by mouth daily. (Patient not taking: Reported on 04/17/2023) 90  tablet 3   Blood Glucose Monitoring Suppl (ACCU-CHEK GUIDE) w/Device KIT Use daily to check blood sugar.  DX E11.69 1 kit 0   Lancets Misc. (ACCU-CHEK SOFTCLIX LANCET DEV) KIT Use daily to check blood sugar.  DX E11.69       meclizine  (ANTIVERT ) 12.5 MG tablet Take 1 tablet (12.5 mg total) by mouth 3 (  three) times daily as needed for dizziness. (Patient not taking: Reported on 04/17/2023) 30 tablet 0   zinc  sulfate 220 (50 Zn) MG capsule Take 1 capsule (220 mg total) by mouth daily. (Patient not taking: Reported on 04/17/2023) 30 capsule 1   Zoster Vaccine Adjuvanted (SHINGRIX ) injection Inject into the muscle. (Patient not taking: Reported on 04/17/2023) 0.5 mL 1          Home: Home Living Family/patient expects to be discharged to:: Private residence Living Arrangements: Spouse/significant other Available Help at Discharge: Family, Available 24 hours/day Type of Home: House Home Access: Stairs to enter [employer] of Steps: 3 Entrance Stairs-Rails: Right Home Layout: Two level, Bed/bath upstairs, Other (Comment) Bathroom Shower/Tub: Health Visitor: Handicapped height Home Equipment: Agricultural Consultant (2 wheels), BSC/3in1 Additional Comments: unsure of accuracy as pt has questionable cognition  Functional History: Prior Function Prior Level of Function : Independent/Modified Independent, Driving, Patient poor historian/Family not available Mobility Comments: No AD ADLs Comments: Ind with ADLs per pt report Functional Status:  Mobility: Bed Mobility Overal bed mobility: Needs Assistance Bed Mobility: Supine to Sit Rolling: Supervision Sidelying to sit: Min assist Supine to sit: Contact guard Sit to supine: Mod assist Sit to sidelying: Mod assist General bed mobility comments: pt able to bring LEs off EOB, use bed rail to pull self up to EOB, HOB elevated, reports he sleeps in a recliner Transfers Overall transfer level: Needs assistance Equipment used:  Rolling walker (2 wheels) Transfers: Sit to/from Stand Sit to Stand: Mod assist General transfer comment: modA to power up, max verbal cues for hand placement to push up from bed, pt with posterior bias requiring minA to stabilize, also with R lateral bias as well Ambulation/Gait Ambulation/Gait assistance: Mod assist Gait Distance (Feet): 75 Feet (x2 (required seated rest break)) Assistive device: Rolling walker (2 wheels) Gait Pattern/deviations: Step-through pattern, Decreased stride length, Staggering right, Narrow base of support General Gait Details: minA progressing to modA. Pt much improved from yesterday with minimal ataxia however hugs the R side of the walker and vears to the Right requiring assist for walker management. With onset of fatigue pt with noted LOB x3 requiring modA to prevent fall, pt with short step length and narrow base of support with onset of fatigue as well Gait velocity: dec Gait velocity interpretation: <1.31 ft/sec, indicative of household ambulator   ADL: ADL Overall ADL's : Needs assistance/impaired Eating/Feeding: Independent Grooming: Wash/dry hands, Wash/dry face, Contact guard assist, Sitting Upper Body Bathing: Contact guard assist, Sitting Upper Body Bathing Details (indicate cue type and reason): simulated Lower Body Bathing: Moderate assistance Lower Body Bathing Details (indicate cue type and reason): simulated Upper Body Dressing : Contact guard assist, Sitting   Cognition: Cognition Overall Cognitive Status: Within Functional Limits for tasks assessed Orientation Level: Oriented X4 Cognition Arousal: Alert Behavior During Therapy: WFL for tasks assessed/performed Overall Cognitive Status: Within Functional Limits for tasks assessed General Comments: pt with understanding of having had a CVA in cerebellum and need for rehab, cooperative, eager to mobilize, able to follow commands. pt HOH causing some delay in processing but overall good  cognition   Blood pressure (!) 116/55, pulse 93, temperature 98.2 F (36.8 C), temperature source Oral, resp. rate 18, height 6' 1 (1.854 m), weight 104.4 kg, SpO2 98%. Physical Exam   General: No apparent distress HEENT: Head is normocephalic, atraumatic, sclera anicteric, oral mucosa pink and moist, wearing glasses Neck: Supple without JVD or lymphadenopathy Heart: Reg rate and rhythm. No  murmurs rubs or gallops Chest: CTA bilaterally without wheezes, rales, or rhonchi; no distress Abdomen: Soft, non-tender, non-distended, bowel sounds positive. Extremities: No clubbing, cyanosis, or edema. Pulses are 2+ Psych: Pt's affect is appropriate. Pt is cooperative Skin: Clean and intact without signs of breakdown Neuro:     Mental Status: AAOx3, memory intact, fund of knowledge appropriate Speech/Languate: Naming and repetition intact, fluent, follows simple commands CRANIAL NERVES: II: PERRL. Visual fields full III, IV, VI: EOM intact, no gaze preference or deviation. + Nystagmus with lateral gaze  V: normal sensation bilaterally VII: no asymmetry VIII: normal hearing to speech IX, X: normal palatal elevation XI: 5/5 head turn and 5/5 shoulder shrug bilaterally XII: Tongue midline     MOTOR: RUE: 5/5 Deltoid, 5/5 Biceps, 5/5 Triceps,5/5 Grip LUE: 5/5 Deltoid, 5/5 Biceps, 5/5 Triceps, 5/5 Grip RLE: HF 5/5, KE 5/5, ADF 5/5, APF 5/5 LLE: HF 5/5, KE 5/5, ADF 5/5, APF 5/5     REFLEXES: No ankle clonus SENSORY: Intact in all 4 extremities however decreased in distal feet-chronic neuropathy related   Coordination: Normal finger to nose and heel to shin, no tremor   2 IVs in right upper extremity   Lab Results Last 24 Hours       Results for orders placed or performed during the hospital encounter of 04/16/23 (from the past 24 hours)  Glucose, capillary     Status: Abnormal    Collection Time: 04/19/23  5:07 PM  Result Value Ref Range    Glucose-Capillary 350 (H) 70 - 99 mg/dL   Glucose, capillary     Status: Abnormal    Collection Time: 04/19/23  9:03 PM  Result Value Ref Range    Glucose-Capillary 298 (H) 70 - 99 mg/dL  Comprehensive metabolic panel     Status: Abnormal    Collection Time: 04/20/23  4:02 AM  Result Value Ref Range    Sodium 134 (L) 135 - 145 mmol/L    Potassium 3.4 (L) 3.5 - 5.1 mmol/L    Chloride 104 98 - 111 mmol/L    CO2 22 22 - 32 mmol/L    Glucose, Bld 239 (H) 70 - 99 mg/dL    BUN 18 8 - 23 mg/dL    Creatinine, Ser 8.77 0.61 - 1.24 mg/dL    Calcium  8.8 (L) 8.9 - 10.3 mg/dL    Total Protein 5.2 (L) 6.5 - 8.1 g/dL    Albumin 2.8 (L) 3.5 - 5.0 g/dL    AST 40 15 - 41 U/L    ALT 36 0 - 44 U/L    Alkaline Phosphatase 40 38 - 126 U/L    Total Bilirubin 1.2 0.0 - 1.2 mg/dL    GFR, Estimated >39 >39 mL/min    Anion gap 8 5 - 15  CBC     Status: Abnormal    Collection Time: 04/20/23  4:02 AM  Result Value Ref Range    WBC 7.7 4.0 - 10.5 K/uL    RBC 4.40 4.22 - 5.81 MIL/uL    Hemoglobin 11.9 (L) 13.0 - 17.0 g/dL    HCT 63.0 (L) 60.9 - 52.0 %    MCV 83.9 80.0 - 100.0 fL    MCH 27.0 26.0 - 34.0 pg    MCHC 32.2 30.0 - 36.0 g/dL    RDW 85.0 88.4 - 84.4 %    Platelets 235 150 - 400 K/uL    nRBC 0.0 0.0 - 0.2 %  Glucose, capillary     Status:  Abnormal    Collection Time: 04/20/23  6:20 AM  Result Value Ref Range    Glucose-Capillary 256 (H) 70 - 99 mg/dL  Glucose, capillary     Status: Abnormal    Collection Time: 04/20/23 12:11 PM  Result Value Ref Range    Glucose-Capillary 330 (H) 70 - 99 mg/dL    Comment 1 Notify RN      Comment 2 Document in Chart         Imaging Results (Last 48 hours)  CT ANGIO HEAD NECK W WO CM Result Date: 04/20/2023 CLINICAL DATA:  Stroke/TIA.  Determine embolic source. EXAM: CT ANGIOGRAPHY HEAD AND NECK WITH AND WITHOUT CONTRAST TECHNIQUE: Multidetector CT imaging of the head and neck was performed using the standard protocol during bolus administration of intravenous contrast. Multiplanar CT image  reconstructions and MIPs were obtained to evaluate the vascular anatomy. Carotid stenosis measurements (when applicable) are obtained utilizing NASCET criteria, using the distal internal carotid diameter as the denominator. RADIATION DOSE REDUCTION: This exam was performed according to the departmental dose-optimization program which includes automated exposure control, adjustment of the mA and/or kV according to patient size and/or use of iterative reconstruction technique. CONTRAST:  75mL OMNIPAQUE  IOHEXOL  350 MG/ML SOLN COMPARISON:  MRI yesterday. FINDINGS: CT HEAD FINDINGS Brain: Chronic small-vessel ischemic changes affect the pons. Small acute infarction of the middle cerebellar peduncle on the right cannot be specifically seen by CT. Cerebral hemispheres show chronic small-vessel ischemic changes of the white matter without evidence of an acute stroke. No mass, hemorrhage, hydrocephalus or extra-axial collection. Vascular: There is atherosclerotic calcification of the major vessels at the base of the brain. Skull: Negative Sinuses/Orbits: Clear/normal Other: None Review of the MIP images confirms the above findings CTA NECK FINDINGS Aortic arch: Aortic atherosclerosis.  Branching pattern is normal. Right carotid system: Common carotid artery widely patent to the bifurcation. Minimal atherosclerotic plaque at the bifurcation and ICA bulb but no stenosis. Cervical ICA widely patent. Left carotid system: Common carotid artery widely patent to the bifurcation. Calcified plaque at the bifurcation and proximal ICA bulb. Minimal diameter of the proximal ICA is 1.75 mm. Compared to a more distal cervical ICA diameter of 4.2 mm, this indicates a 60% stenosis. Vertebral arteries: No proximal subclavian stenosis. Right vertebral artery origin is widely patent. The vessel appears normal through the cervical region to the foramen magnum. There is soft and calcified plaque at the left vertebral artery origin with a 50%  stenosis. Beyond that, the vessel is patent through the cervical region to the foramen magnum. Skeleton: Minimal spondylosis. Other neck: No mass or lymphadenopathy. Upper chest: Chronic scarring in the anterior right upper lobe. Review of the MIP images confirms the above findings CTA HEAD FINDINGS Anterior circulation: Both internal carotid arteries are patent through the skull base and siphon regions. There is siphon atherosclerotic calcification with maximal stenosis estimated at 30% on both sides. The anterior and middle cerebral vessels are patent. No large vessel occlusion or flow limiting proximal stenosis. No aneurysm or vascular malformation. Posterior circulation: Both vertebral arteries are widely patent to the basilar artery. No basilar stenosis. Posterior circulation branch vessels are patent. There is moderate long segment stenosis of the proximal right PCA, but the vessel does show persistent flow. Venous sinuses: Patent and normal. Anatomic variants: None significant. Review of the MIP images confirms the above findings IMPRESSION: 1. No large vessel occlusion or flow limiting proximal intracranial stenosis. 2. 60% stenosis of the proximal left ICA due to  calcified plaque. 3. 50% stenosis of the left vertebral artery origin due to soft and calcified plaque. 4. 30% stenosis of both internal carotid artery siphon regions due to calcified plaque. 5. Moderate long segment stenosis of the proximal right PCA, but the vessel does show persistent flow. 6. Aortic atherosclerosis. 7. Chronic small-vessel ischemic changes of the pons and cerebral hemispheric white matter. Small acute infarction of the middle cerebellar peduncle on the right shown by MRI cannot be specifically seen by CT. Aortic Atherosclerosis (ICD10-I70.0). Electronically Signed   By: Oneil Officer M.D.   On: 04/20/2023 11:22    MR BRAIN W WO CONTRAST Result Date: 04/19/2023 CLINICAL DATA:  Vertigo EXAM: MRI HEAD WITHOUT AND WITH CONTRAST  TECHNIQUE: Multiplanar, multiecho pulse sequences of the brain and surrounding structures were obtained without and with intravenous contrast. CONTRAST:  10mL GADAVIST  GADOBUTROL  1 MMOL/ML IV SOLN COMPARISON:  11/08/2022 MRI head, 04/16/2023 CT head FINDINGS: Brain: Small area of restricted diffusion with ADC correlate in the right middle cerebellar peduncle (series 2, image 13 and 14; series 3, image 14). This is associated with a small amount of increased T2 hyperintense signal. No acute hemorrhage, mass, mass effect, or midline shift. No hydrocephalus or extra-axial collection. Pituitary and craniocervical junction within normal limits. Focus of hemosiderin deposition in the pons, likely sequela prior hypertensive microhemorrhage. Remote lacunar infarcts in the right lentiform nucleus, bilateral thalami, and right pons. Advanced cerebral atrophy for age. Confluent T2 hyperintense signal in the periventricular white matter, likely the sequela of moderate chronic small vessel ischemic disease. Dilated perivascular spaces in the basal ganglia and cortex. Vascular: Normal arterial flow voids. Skull and upper cervical spine: Normal marrow signal. Sinuses/Orbits: Clear paranasal sinuses. No acute finding in the orbits. Status post bilateral lens replacements. Other: Fluid in the right mastoid air cells. IMPRESSION: Small acute infarct in the right middle cerebellar peduncle. Electronically Signed   By: Donald Campion M.D.   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 Yes Co-Morbidities requiring supervision/potential complications:  -Chronic vertigo, diabetes mellitus type 2 with polyneuropathy, DKA, BPH, hypertension, male breast cancer Due to bladder  management, bowel management, safety, skin/wound care, disease management, medication administration, pain management, and patient education, does the patient require 24 hr/day rehab nursing? Yes Does the patient require coordinated care of a physician, rehab nurse, therapy disciplines of PT/OT to address physical and functional deficits in the context of the above medical diagnosis(es)? Yes Addressing deficits in the following areas: balance, endurance, locomotion, strength, transferring, bowel/bladder control, bathing, dressing, feeding, grooming, toileting, and psychosocial support Can the patient actively participate in an intensive therapy program of at least 3 hrs of therapy per day at least 5 days per week? Yes The potential for patient to make measurable gains while on inpatient rehab is excellent Anticipated functional outcomes upon discharge from inpatient rehab are modified independent and supervision  with PT, modified independent and supervision with OT, n/a with SLP. Estimated rehab length of stay to reach the above functional goals is: 7-10 Anticipated discharge destination: Home Overall Rehab/Functional Prognosis: excellent   POST ACUTE RECOMMENDATIONS: This patient's condition is appropriate for continued rehabilitative care in the following setting: CIR Patient has agreed to participate in recommended program. Yes Note that  insurance prior authorization may be required for reimbursement for recommended care.   Comment: Patient appears to be a good candidate for CIR pending medical stability.  Rehab coordinator to follow-up     MEDICAL RECOMMENDATIONS: Consider laxative for constipation     I have personally performed a face to face diagnostic evaluation of this patient. Additionally, I have examined the patient's medical record including any pertinent labs and radiographic images. If the physician assistant has documented in this note, I have reviewed and edited or otherwise  concur with the physician assistant's documentation.   Thanks,   Murray Collier, MD 04/20/2023           Routing History

## 2023-04-22 NOTE — Discharge Summary (Signed)
 Physician Discharge Summary   Patient: John [name] MRN: [id number] DOB: [birth year]  Admit date:     04/16/2023  Discharge date: 04/22/23  Discharge Physician: Owen [name] Lore   PCP: [name], DO   Recommendations at discharge:   Further adjustment of Insulin .  Bowel regimen.  Follow up with Neurology   Discharge Diagnoses: Principal Problem:   DKA (diabetic ketoacidosis) (HCC) Active Problems:   Chronic vertigo   Insulin  dependent type 2 diabetes mellitus (HCC)   Peripheral neuropathy   Essential hypertension   BPH (benign prostatic hyperplasia)   Hyperlipidemia   Acute kidney injury 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)  Resolved Problems:   * No resolved hospital problems. 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 Presented 1/4 med [facility] with vertigo, nausea, unable to tolerate oral appears to be in mild DKA.  CT head no acute intracranial abnormality.  Subsequently had MRI which was positive for acute stroke  Assessment and Plan:   1-Acute right middle cerebellar peduncle stroke: Etiology of vessels disease CT head: No acute abnormality CTA head and neck no large vessel occlusion, 60% stenosis of the proximal left ICA and 50% stenosis of the origin of the left vertebral artery, 30% stenosis bilateral carotid artery. -MRI small acute infarct right middle cerebellar peduncle -Echo Normal ECHO -LDL 78 on statins.,  -A1c 12 -Plan to continue aspirin  and Plavix  for 3 weeks then Plavix  alone Therapy recommend CIR, stable to transfer today.    Diabetes type 2  , uncontrolled, DKA on admission Continue 70/30 twice daily: Continue sliding scale.  We discussed  diet Hemoglobin A1c 12 Increase 70/30- to 19 units BID>    Hypertension: Continue amlodipine . Uncontrolled, resume lasix  today, if no better after lasix  could increase Norvasc .   Hyperlipidemia: LDL 78 continue Lipitor  at discharge   Hypokalemia; replaced.  Constipation; Miralax , Sorbitol .l Estimated body mass index is 30.45 kg/m as calculated from the following:   Height as of this encounter: 6' 1 (1.854 m).   Weight as of this encounter: 104.7 kg.        Consultants: Neurology  Procedures performed: ECHO Disposition: Rehabilitation facility Diet recommendation:  Discharge Diet Orders (From admission, onward)     Start     Ordered   04/22/23 0000  Diet - low sodium heart healthy        04/22/23 1111           Carb modified diet DISCHARGE MEDICATION: Allergies as of 04/22/2023       Reactions   Ramipril Anaphylaxis   Other Diarrhea   Severe intolerance to Chemotherapy in the past.   Sertraline Other (See Comments)   Extreme headaches   Adhesive [tape] Rash        Medication List     STOP taking these medications    HumaLOG  KwikPen 200 UNIT/ML KwikPen Generic drug: insulin  lispro   Shingrix  injection Generic drug: Zoster Vaccine Adjuvanted   Toujeo  Max SoloStar 300 UNIT/ML Solostar Pen Generic drug: insulin  glargine (2 Unit Dial )   zinc  sulfate (50mg  elemental zinc ) 220 (50 Zn) MG capsule       TAKE these medications  Accu-Chek Guide w/Device Kit Use daily to check blood sugar.  DX E11.69   Accu-Chek Softclix Lancet Dev Kit Use daily to check blood sugar.  DX E11.69   Accu-Chek Softclix Lancets lancets Use daily to check blood sugar.  DX E11.69   acetaminophen  650 MG CR tablet Commonly known as: TYLENOL  Take 650-1,300 mg by mouth every 8 (eight) hours as needed for pain.   amLODipine  5 MG tablet Commonly known as: NORVASC  TAKE  1 TABLET(5 MG) BY MOUTH DAILY What changed: See the new instructions.   aspirin  EC 81 MG tablet Take 1 tablet (81 mg total) by mouth daily. Swallow whole. Start taking on: April 23, 2023 What changed:  medication strength how much to take when to take this additional instructions   atorvastatin  80 MG tablet Commonly known as: LIPITOR  Take 1 tablet (80 mg total) by mouth at bedtime. What changed:  medication strength how much to take when to take this   clopidogrel  75 MG tablet Commonly known as: PLAVIX  Take 1 tablet (75 mg total) by mouth daily. Start taking on: April 23, 2023   finasteride  5 MG tablet Commonly known as: PROSCAR  Take 5 mg by mouth daily.   furosemide  80 MG tablet Commonly known as: LASIX  Take 1 tablet (80 mg total) by mouth daily.   insulin  aspart protamine- aspart (70-30) 100 UNIT/ML injection Commonly known as: NOVOLOG  MIX 70/30 Inject 0.19 mLs (19 Units total) into the skin 2 (two) times daily with a meal.   meclizine  12.5 MG tablet Commonly known as: ANTIVERT  Take 1 tablet (12.5 mg total) by mouth 3 (three) times daily as needed for dizziness.   metFORMIN  500 MG 24 hr tablet Commonly known as: GLUCOPHAGE -XR Take 2 tablets (1,000 mg total) by mouth 2 (two) times daily with a meal.   methocarbamol  500 MG tablet Commonly known as: ROBAXIN  TAKE 1 TABLET(500 MG) BY MOUTH EVERY 8 HOURS AS NEEDED FOR MUSCLE SPASMS What changed: See the new instructions.   multivitamin tablet Take 1 tablet by mouth 2 (two) times a week.   nystatin  cream Commonly known as: MYCOSTATIN  Apply 1 Application topically 2 (two) times daily. What changed:  when to take this reasons to take this   omeprazole  40 MG capsule Commonly known as: PRILOSEC TAKE 1 CAPSULE(40 MG) BY MOUTH DAILY What changed: See the new instructions.   polyethylene glycol 17 g packet Commonly known as: MIRALAX  / GLYCOLAX  Take 17 g by mouth 2 (two) times daily.   potassium chloride  SA  20 MEQ tablet Commonly known as: KLOR-CON  M Take 2 tablets (40 mEq total) by mouth daily. Start taking on: April 23, 2023   tamoxifen  20 MG tablet Commonly known as: NOLVADEX  Take 20 mg by mouth daily.   tamsulosin  0.4 MG Caps capsule Commonly known as: FLOMAX  Take 0.4 mg by mouth daily.   triamcinolone  0.1 % cream : eucerin Crea Apply 1 Application topically 2 (two) times daily.   vitamin C  1000 MG tablet Take 1,000 mg by mouth 3 (three) times a week.        Follow-up Information     [facility]. Schedule an appointment as soon as possible for a visit in 1 month(s).   Specialty: Neurology Why: stroke clinic Contact information: [address] [place]  [address] [phone]               Discharge Exam: [name]   04/20/23 0625 04/21/23 0637 04/22/23 0600  Weight: 104.4  kg 104.7 kg 103.3 kg   General; NAD  Condition at discharge: stable  The results of significant diagnostics from this hospitalization (including imaging, microbiology, ancillary and laboratory) are listed below for reference.   Imaging Studies: ECHOCARDIOGRAM COMPLETE Result Date: 04/21/2023    ECHOCARDIOGRAM REPORT   Patient Name:   John [name] Date of Exam: 04/21/2023 Medical Rec #:  [id number]        Height:       73.0 in Accession #:    [id number]       Weight:       230.8 lb Date of Birth:  [birth year]        BSA:          2.287 m Patient Age:    77 years         BP:           138/61 mmHg Patient Gender: M                HR:           89 bpm. Exam Location:  Inpatient Procedure: 2D Echo, Cardiac Doppler, Color Doppler and Intracardiac            Opacification Agent Indications:    Stroke  History:        Patient has prior history of Echocardiogram examinations, most                 recent 11/14/2020. Signs/Symptoms:Edema and Fatigue; Risk                 Factors:Hypertension, Diabetes and Dyslipidemia.  Sonographer:    Juanita Shaw  Referring Phys: [id number] SRISHTI L BHAGAT  Sonographer Comments: Image acquisition challenging due to patient body habitus. IMPRESSIONS  1. Left ventricular ejection fraction, by estimation, is 60 to 65%. Left ventricular ejection fraction by 2D MOD biplane is 61.9 %. The left ventricle has normal function. The left ventricle has no regional wall motion abnormalities. Left ventricular diastolic parameters are consistent with Grade I diastolic dysfunction (impaired relaxation).  2. Right ventricular systolic function is normal. The right ventricular size is normal. There is normal pulmonary artery systolic pressure. The estimated right ventricular systolic pressure is 25.7 mmHg.  3. The mitral valve is grossly normal. No evidence of mitral valve regurgitation.  4. The aortic valve is tricuspid. Aortic valve regurgitation is not visualized. Aortic valve sclerosis/calcification is present, without any evidence of aortic stenosis. Comparison(s): No significant change from prior study. 11/14/2020: LVEF 60-65%. FINDINGS  Left Ventricle: Left ventricular ejection fraction, by estimation, is 60 to 65%. Left ventricular ejection fraction by 2D MOD biplane is 61.9 %. The left ventricle has normal function. The left ventricle has no regional wall motion abnormalities. The left ventricular internal cavity size was normal in size. There is no left ventricular hypertrophy. Left ventricular diastolic parameters are consistent with Grade I diastolic dysfunction (impaired relaxation). Indeterminate filling pressures. Right Ventricle: The right ventricular size is normal. No increase in right ventricular wall thickness. Right ventricular systolic function is normal. There is normal pulmonary artery systolic pressure. The tricuspid regurgitant velocity is 2.38 m/s, and  with an assumed right atrial pressure of 3 mmHg, the estimated right ventricular systolic pressure is 25.7 mmHg. Left Atrium: Left atrial size was normal in size. Right  Atrium: Right atrial size was normal in size. Pericardium: There is no evidence of pericardial effusion. Mitral Valve: The mitral valve is grossly normal. No evidence of mitral valve regurgitation. MV  peak gradient, 5.9 mmHg. The mean mitral valve gradient is 2.0 mmHg. Tricuspid Valve: The tricuspid valve is grossly normal. Tricuspid valve regurgitation is trivial. Aortic Valve: The aortic valve is tricuspid. Aortic valve regurgitation is not visualized. Aortic valve sclerosis/calcification is present, without any evidence of aortic stenosis. Aortic valve mean gradient measures 3.0 mmHg. Aortic valve peak gradient measures 4.4 mmHg. Aortic valve area, by VTI measures 2.96 cm. Pulmonic Valve: The pulmonic valve was grossly normal. Pulmonic valve regurgitation is trivial. Aorta: The aortic root and ascending aorta are structurally normal, with no evidence of dilitation. IAS/Shunts: No atrial level shunt detected by color flow Doppler.  LEFT VENTRICLE PLAX 2D                        Biplane EF (MOD) LVIDd:         4.50 cm         LV Biplane EF:   Left LVIDs:         2.90 cm                          ventricular LV PW:         0.70 cm                          ejection LV IVS:        0.90 cm                          fraction by LVOT diam:     2.00 cm                          2D MOD LV SV:         63                               biplane is LV SV Index:   27                               61.9 %. LVOT Area:     3.14 cm                                Diastology                                LV e' medial:    5.59 cm/s LV Volumes (MOD)               LV E/e' medial:  12.1 LV vol d, MOD    88.1 ml       LV e' lateral:   8.70 cm/s A2C:                           LV E/e' lateral: 7.8 LV vol d, MOD    88.4 ml A4C: LV vol s, MOD    30.1 ml A2C: LV vol s, MOD    36.7 ml A4C: LV SV MOD A2C:   58.0 ml LV SV MOD A4C:   88.4 ml LV SV MOD BP:  55.5 ml RIGHT VENTRICLE RV Basal diam:  3.70 cm RV Mid diam:    2.20 cm RV S prime:     13.40  cm/s TAPSE (M-mode): 3.2 cm LEFT ATRIUM             Index        RIGHT ATRIUM           Index LA diam:        3.60 cm 1.57 cm/m   RA Area:     12.30 cm LA Vol (A2C):   36.9 ml 16.14 ml/m  RA Volume:   25.20 ml  11.02 ml/m LA Vol (A4C):   42.5 ml 18.59 ml/m LA Biplane Vol: 43.7 ml 19.11 ml/m  AORTIC VALVE                    PULMONIC VALVE AV Area (Vmax):    2.85 cm     PV Vmax:          0.95 m/s AV Area (Vmean):   2.59 cm     PV Peak grad:     3.6 mmHg AV Area (VTI):     2.96 cm     PR End Diast Vel: 1.08 msec AV Vmax:           105.00 cm/s AV Vmean:          77.400 cm/s AV VTI:            0.212 m AV Peak Grad:      4.4 mmHg AV Mean Grad:      3.0 mmHg LVOT Vmax:         95.20 cm/s LVOT Vmean:        63.700 cm/s LVOT VTI:          0.200 m LVOT/AV VTI ratio: 0.94  AORTA Ao Root diam: 3.30 cm Ao Asc diam:  3.00 cm MITRAL VALVE                TRICUSPID VALVE MV Area (PHT): 3.51 cm     TR Peak grad:   22.7 mmHg MV Area VTI:   3.06 cm     TR Vmax:        238.00 cm/s MV Peak grad:  5.9 mmHg MV Mean grad:  2.0 mmHg     SHUNTS MV Vmax:       1.21 m/s     Systemic VTI:  0.20 m MV Vmean:      65.0 cm/s    Systemic Diam: 2.00 cm MV Decel Time: 216 msec MV E velocity: 67.60 cm/s MV A velocity: 103.00 cm/s MV E/A ratio:  0.66 Vinie Maxcy MD Electronically signed by Vinie Maxcy MD Signature Date/Time: 04/21/2023/11:05:34 AM    Final    CT ANGIO HEAD NECK W WO CM Result Date: 04/20/2023 CLINICAL DATA:  Stroke/TIA.  Determine embolic source. EXAM: CT ANGIOGRAPHY HEAD AND NECK WITH AND WITHOUT CONTRAST TECHNIQUE: Multidetector CT imaging of the head and neck was performed using the standard protocol during bolus administration of intravenous contrast. Multiplanar CT image reconstructions and MIPs were obtained to evaluate the vascular anatomy. Carotid stenosis measurements (when applicable) are obtained utilizing NASCET criteria, using the distal internal carotid diameter as the denominator. RADIATION DOSE REDUCTION:  This exam was performed according to the departmental dose-optimization program which includes automated exposure control, adjustment of the mA and/or kV according to patient size and/or use of iterative reconstruction technique. CONTRAST:  75mL OMNIPAQUE  IOHEXOL  350 MG/ML  SOLN COMPARISON:  MRI yesterday. FINDINGS: CT HEAD FINDINGS Brain: Chronic small-vessel ischemic changes affect the pons. Small acute infarction of the middle cerebellar peduncle on the right cannot be specifically seen by CT. Cerebral hemispheres show chronic small-vessel ischemic changes of the white matter without evidence of an acute stroke. No mass, hemorrhage, hydrocephalus or extra-axial collection. Vascular: There is atherosclerotic calcification of the major vessels at the base of the brain. Skull: Negative Sinuses/Orbits: Clear/normal Other: None Review of the MIP images confirms the above findings CTA NECK FINDINGS Aortic arch: Aortic atherosclerosis.  Branching pattern is normal. Right carotid system: Common carotid artery widely patent to the bifurcation. Minimal atherosclerotic plaque at the bifurcation and ICA bulb but no stenosis. Cervical ICA widely patent. Left carotid system: Common carotid artery widely patent to the bifurcation. Calcified plaque at the bifurcation and proximal ICA bulb. Minimal diameter of the proximal ICA is 1.75 mm. Compared to a more distal cervical ICA diameter of 4.2 mm, this indicates a 60% stenosis. Vertebral arteries: No proximal subclavian stenosis. Right vertebral artery origin is widely patent. The vessel appears normal through the cervical region to the foramen magnum. There is soft and calcified plaque at the left vertebral artery origin with a 50% stenosis. Beyond that, the vessel is patent through the cervical region to the foramen magnum. Skeleton: Minimal spondylosis. Other neck: No mass or lymphadenopathy. Upper chest: Chronic scarring in the anterior right upper lobe. Review of the MIP  images confirms the above findings CTA HEAD FINDINGS Anterior circulation: Both internal carotid arteries are patent through the skull base and siphon regions. There is siphon atherosclerotic calcification with maximal stenosis estimated at 30% on both sides. The anterior and middle cerebral vessels are patent. No large vessel occlusion or flow limiting proximal stenosis. No aneurysm or vascular malformation. Posterior circulation: Both vertebral arteries are widely patent to the basilar artery. No basilar stenosis. Posterior circulation branch vessels are patent. There is moderate long segment stenosis of the proximal right PCA, but the vessel does show persistent flow. Venous sinuses: Patent and normal. Anatomic variants: None significant. Review of the MIP images confirms the above findings IMPRESSION: 1. No large vessel occlusion or flow limiting proximal intracranial stenosis. 2. 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 3. 50% stenosis of the left vertebral artery origin due to soft and calcified plaque. 4. 30% stenosis of both internal carotid artery siphon regions due to calcified plaque. 5. Moderate long segment stenosis of the proximal right PCA, but the vessel does show persistent flow. 6. Aortic atherosclerosis. 7. Chronic small-vessel ischemic changes of the pons and cerebral hemispheric white matter. Small acute infarction of the middle cerebellar peduncle on the right shown by MRI cannot be specifically seen by CT. Aortic Atherosclerosis (ICD10-I70.0). Electronically Signed   By: Oneil Officer M.D.   On: 04/20/2023 11:22   MR BRAIN W WO CONTRAST Result Date: 04/19/2023 CLINICAL DATA:  Vertigo EXAM: MRI HEAD WITHOUT AND WITH CONTRAST TECHNIQUE: Multiplanar, multiecho pulse sequences of the brain and surrounding structures were obtained without and with intravenous contrast. CONTRAST:  10mL GADAVIST  GADOBUTROL  1 MMOL/ML IV SOLN COMPARISON:  11/08/2022 MRI head, 04/16/2023 CT head  FINDINGS: Brain: Small area of restricted diffusion with ADC correlate in the right middle cerebellar peduncle (series 2, image 13 and 14; series 3, image 14). This is associated with a small amount of increased T2 hyperintense signal. No acute hemorrhage, mass, mass effect, or midline shift. No hydrocephalus or extra-axial collection. Pituitary  and craniocervical junction within normal limits. Focus of hemosiderin deposition in the pons, likely sequela prior hypertensive microhemorrhage. Remote lacunar infarcts in the right lentiform nucleus, bilateral thalami, and right pons. Advanced cerebral atrophy for age. Confluent T2 hyperintense signal in the periventricular white matter, likely the sequela of moderate chronic small vessel ischemic disease. Dilated perivascular spaces in the basal ganglia and cortex. Vascular: Normal arterial flow voids. Skull and upper cervical spine: Normal marrow signal. Sinuses/Orbits: Clear paranasal sinuses. No acute finding in the orbits. Status post bilateral lens replacements. Other: Fluid in the right mastoid air cells. IMPRESSION: Small acute infarct in the right middle cerebellar peduncle. Electronically Signed   By: Donald Campion M.D.   On: 04/19/2023 19:40   CT Head Wo Contrast Result Date: 04/16/2023 CLINICAL DATA:  Headache, increasing frequency or severity. Dizziness and vomiting. EXAM: CT HEAD WITHOUT CONTRAST TECHNIQUE: Contiguous axial images were obtained from the base of the skull through the vertex without intravenous contrast. RADIATION DOSE REDUCTION: This exam was performed according to the departmental dose-optimization program which includes automated exposure control, adjustment of the mA and/or kV according to patient size and/or use of iterative reconstruction technique. COMPARISON:  Head MRI 11/08/2022 FINDINGS: Brain: There is no evidence of an acute infarct, intracranial hemorrhage, mass, midline shift, or extra-axial fluid collection. Patchy  hypodensities in the cerebral white matter are nonspecific but compatible with moderate chronic small vessel ischemic disease. 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 Mild cerebral atrophy is within normal limits for age. Vascular: Calcified atherosclerosis at the skull base. No hyperdense vessel. Skull: No acute fracture or suspicious osseous lesion. Sinuses/Orbits: Paranasal sinuses and mastoid air cells are clear. Bilateral cataract extraction. Other: None. IMPRESSION: 1. No evidence of acute intracranial abnormality. 2. Moderate chronic small vessel ischemic disease. Electronically Signed   By: Dasie Hamburg M.D.   On: 04/16/2023 16:45    Microbiology: Results for orders placed or performed during the hospital encounter of 10/26/19  Blood Culture (routine x 2)     Status: None   Collection Time: 10/26/19 12:03 PM   Specimen: BLOOD  Result Value Ref Range Status   Specimen Description   Final    BLOOD RIGHT WRIST Performed at [facility], [address], [place], [place] [address]    Special Requests   Final    BOTTLES DRAWN AEROBIC AND ANAEROBIC Blood Culture results may not be optimal due to an inadequate volume of blood received in culture bottles Performed at [facility], [address], [place], [place] [address]    Culture   Final    NO GROWTH 5 DAYS Performed at [facility], [address], [place], [place] [address]    Report Status 10/31/2019 FINAL  Final  Blood Culture (routine x 2)     Status: None   Collection Time: 10/26/19 12:07 PM   Specimen: BLOOD  Result Value Ref Range Status   Specimen Description   Final    BLOOD BLOOD RIGHT FOREARM Performed at [facility], [address], [place], [place] [address]    Special Requests   Final    BOTTLES DRAWN AEROBIC AND ANAEROBIC Blood Culture adequate volume Performed at [facility], [address],  [place], [place] [address]    Culture   Final    NO GROWTH 5 DAYS Performed at [facility], [address] [place], [place] [address]  Report Status 10/31/2019 FINAL  Final    Labs: CBC: Recent Labs  Lab 04/16/23 1448 04/16/23 1555 04/17/23 0737 04/18/23 0243 04/19/23 0332 04/20/23 0402  WBC 9.3  --  11.6* 11.7* 8.2 7.7  HGB 12.7* 12.9* 11.7* 12.0* 12.0* 11.9*  HCT 39.4 38.0* 35.9* 37.3* 37.1* 36.9*  MCV 83.3  --  83.1 85.2 83.4 83.9  PLT 311  --  285 262 253 235   Basic Metabolic Panel: Recent Labs  Lab 04/17/23 0737 04/18/23 0243 04/19/23 0332 04/20/23 0402 04/22/23 0905  NA 140 139 137 134* 137  K 4.0 3.5 3.0* 3.4* 4.4  CL 102 106 103 104 102  CO2 25 23 23 22 22   GLUCOSE 271* 158* 156* 239* 223*  BUN 20 17 18 18 10   CREATININE 1.08 0.99 1.02 1.22 0.95  CALCIUM  9.0 8.9 9.0 8.8* 9.2   Liver Function Tests: Recent Labs  Lab 04/16/23 1448 04/17/23 0737 04/18/23 0243 04/19/23 0332 04/20/23 0402  AST 40 23 36 39 40  ALT 32 25 25 28  36  ALKPHOS 44 35* 40 40 40  BILITOT 1.2 1.0 1.0 1.3* 1.2  PROT 7.4 5.7* 5.7* 5.5* 5.2*  ALBUMIN 4.2 3.2* 3.0* 2.8* 2.8*   CBG: Recent Labs  Lab 04/21/23 0647 04/21/23 1155 04/21/23 1652 04/21/23 2120 04/22/23 0606  GLUCAP 190* 276* 244* 226* 194*    Discharge time spent: greater than 30 minutes.  Signed: Owen [name] Lore, MD [facility] 04/22/2023

## 2023-04-22 NOTE — H&P (Signed)
 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  Per chart review patient lives with spouse.  Two-level home bed and bath upstairs 3 steps to entry.  Independent prior to admission.  Presented 04/16/2023 with recent follow-up with PCP for shortness of breath as well as dry heaving/dizziness with nausea and inability to tolerate p.o.  Cranial CT scan showed no acute abnormalities.  Patient did not receive tPA.  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  CTA head and neck with no large vessel occlusion or flow-limiting proximal intracranial stenosis.  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  50% stenosis of the left vertebral artery origin due to soft and calcified plaque.  Admission chemistries unremarkable except sodium 134 chloride 94 glucose 407 BUN 31 creatinine 1.34, elevated betahydroxybutyrate level at 1.85, which improved with IV fluid, hemoglobin A1c of 12.4 04/11/2023.  Echocardiogram ejection fraction of 60 to 65% no wall motion abnormalities grade 1 diastolic dysfunction.  Neurology follow-up placed on low-dose aspirin  81 mg daily and Plavix  75 mg daily for CVA prophylaxis x 3 weeks then Plavix  alone.  Hospital course AKI on CKD improved with gentle IV fluids labs creatinine 1.22.  Tolerating a regular consistency diet.  Therapy evaluations completed due to patient decreased functional mobility was admitted for a comprehensive rehab program.   Review of Systems   Constitutional:  Negative for chills and fever.       Decreased p.o. intake  HENT:  Negative for hearing loss.   Eyes:  Negative for blurred vision and double vision.  Respiratory:  Negative for cough, shortness of breath and wheezing.   Cardiovascular:  Negative for chest pain, palpitations and leg swelling.  Gastrointestinal:  Positive for constipation and nausea. Negative for heartburn and vomiting.       GRED as well as dry heaves  Genitourinary:  Positive for urgency.  Musculoskeletal:  Positive for joint pain and myalgias.  Skin:  Negative for rash.  Neurological:  Positive for dizziness and sensory change.       Chronic vertigo  Psychiatric/Behavioral:  Positive for depression.   All other systems reviewed and are negative.       Past Medical History:  Diagnosis Date   BPH (benign prostatic hyperplasia)     CTS (carpal tunnel syndrome)     Depression     Diabetes mellitus     Essential hypertension     GERD (gastroesophageal reflux disease)     History of breast cancer     Hypercholesteremia     Neuropathy               Past Surgical History:  Procedure Laterality Date   CATARACT EXTRACTION, BILATERAL   01/18/2017   LITHOTRIPSY       MASTECTOMY Right 05/02/2018   TONSILLECTOMY AND ADENOIDECTOMY                 Family History  Problem Relation Age of Onset   Lymphoma Mother     Cancer Mother  Diabetes Father     Stroke Father     Ovarian cancer Sister     Cancer Sister     Cancer Paternal Grandmother          Social History:  reports that he has never smoked. He has never used smokeless tobacco. He reports that he does not drink alcohol and does not use drugs. Allergies:  Allergies       Allergies  Allergen Reactions   Ramipril Anaphylaxis   Other Diarrhea      Severe intolerance to Chemotherapy in the past.   Sertraline Other (See Comments)      Extreme headaches   Adhesive [Tape] Rash            Medications Prior to Admission   Medication Sig Dispense Refill   acetaminophen  (TYLENOL ) 650 MG CR tablet Take 650-1,300 mg by mouth every 8 (eight) hours as needed for pain.       amLODipine  (NORVASC ) 5 MG tablet TAKE 1 TABLET(5 MG) BY MOUTH DAILY (Patient taking differently: Take 5 mg by mouth daily.) 90 tablet 3   Ascorbic Acid  (VITAMIN C ) 1000 MG tablet Take 1,000 mg by mouth 3 (three) times a week.       aspirin  325 MG EC tablet Take 325 mg by mouth at bedtime.       finasteride  (PROSCAR ) 5 MG tablet Take 5 mg by mouth daily.       furosemide  (LASIX ) 80 MG tablet Take 1 tablet (80 mg total) by mouth daily. 90 tablet 1   insulin  lispro (HUMALOG  KWIKPEN) 200 UNIT/ML KwikPen Inject 25 Units into the skin 3 (three) times daily before meals. (Patient taking differently: Inject 25 Units into the skin in the morning.) 15 mL 2   metFORMIN  (GLUCOPHAGE -XR) 500 MG 24 hr tablet Take 2 tablets (1,000 mg total) by mouth 2 (two) times daily with a meal. 360 tablet 3   methocarbamol  (ROBAXIN ) 500 MG tablet TAKE 1 TABLET(500 MG) BY MOUTH EVERY 8 HOURS AS NEEDED FOR MUSCLE SPASMS (Patient taking differently: Take 500 mg by mouth every 8 (eight) hours as needed for muscle spasms.) 30 tablet 0   Multiple Vitamin (MULTIVITAMIN) tablet Take 1 tablet by mouth 2 (two) times a week.       nystatin  cream (MYCOSTATIN ) Apply 1 Application topically 2 (two) times daily. (Patient taking differently: Apply 1 Application topically 2 (two) times daily as needed (as directed- to affected areas of the penis and anus).) 30 g 0   omeprazole  (PRILOSEC) 40 MG capsule TAKE 1 CAPSULE(40 MG) BY MOUTH DAILY (Patient taking differently: Take 40 mg by mouth daily before breakfast.) 90 capsule 3   tamoxifen  (NOLVADEX ) 20 MG tablet Take 20 mg by mouth daily.       tamsulosin  (FLOMAX ) 0.4 MG CAPS capsule Take 0.4 mg by mouth daily.       TOUJEO  MAX SOLOSTAR 300 UNIT/ML Solostar Pen Take 15 u in the morning and 70 u in the evening. (Patient taking differently: 45 Units See  admin instructions. Inject 45 units into the skin before breakfast and supper)       Accu-Chek Softclix Lancets lancets Use daily to check blood sugar.  DX E11.69 100 each 3   atorvastatin  (LIPITOR ) 40 MG tablet Take 1 tablet (40 mg total) by mouth daily. (Patient not taking: Reported on 04/17/2023) 90 tablet 3   Blood Glucose Monitoring Suppl (ACCU-CHEK GUIDE) w/Device KIT Use daily to check blood sugar.  DX E11.69 1 kit 0  Lancets Misc. (ACCU-CHEK SOFTCLIX LANCET DEV) KIT Use daily to check blood sugar.  DX E11.69       meclizine  (ANTIVERT ) 12.5 MG tablet Take 1 tablet (12.5 mg total) by mouth 3 (three) times daily as needed for dizziness. (Patient not taking: Reported on 04/17/2023) 30 tablet 0   zinc  sulfate 220 (50 Zn) MG capsule Take 1 capsule (220 mg total) by mouth daily. (Patient not taking: Reported on 04/17/2023) 30 capsule 1   Zoster Vaccine Adjuvanted (SHINGRIX ) injection Inject into the muscle. (Patient not taking: Reported on 04/17/2023) 0.5 mL 1              Home: Home Living Family/patient expects to be discharged to:: Private residence Living Arrangements: Spouse/significant other Available Help at Discharge: Family, Available 24 hours/day Type of Home: House Home Access: Stairs to enter [employer] of Steps: 3 Entrance Stairs-Rails: Right Home Layout: Two level, Bed/bath upstairs, Other (Comment) Bathroom Shower/Tub: Health Visitor: Handicapped height Home Equipment: Agricultural Consultant (2 wheels), BSC/3in1 Additional Comments: verified with wife  Lives With: Spouse   Functional History: Prior Function Prior Level of Function : Independent/Modified Independent, Driving, Patient poor historian/Family not available Mobility Comments: No AD ADLs Comments: Ind with ADLs per pt report   Functional Status:  Mobility: Bed Mobility Overal bed mobility: Needs Assistance Bed Mobility: Supine to Sit, Sit to Supine Rolling: Supervision Sidelying to sit:  Min assist Supine to sit: Supervision Sit to supine: Supervision Sit to sidelying: Mod assist General bed mobility comments: supervision for safety Transfers Overall transfer level: Needs assistance Equipment used: Rolling walker (2 wheels) Transfers: Sit to/from Stand, Bed to chair/wheelchair/BSC Sit to Stand: Min assist Bed to/from chair/wheelchair/BSC transfer type:: Step pivot Step pivot transfers: Min assist General transfer comment: min A to maintain balance with RW Ambulation/Gait Ambulation/Gait assistance: Mod assist Gait Distance (Feet): 70 Feet Assistive device: Rolling walker (2 wheels) Gait Pattern/deviations: Step-through pattern, Decreased stride length, Staggering right, Narrow base of support General Gait Details: mod A needed due to R lean as well as occasionally stepping out of RW with R foot and kicking RW. Balance very poor with changing direction and pt has diffciulty problem solving which way to turn. Gait velocity: dec Gait velocity interpretation: <1.31 ft/sec, indicative of household ambulator   ADL: ADL Overall ADL's : Needs assistance/impaired Eating/Feeding: Independent Grooming: Set up, Sitting Upper Body Bathing: Set up, Supervision/ safety Upper Body Bathing Details (indicate cue type and reason): simulated Lower Body Bathing: Set up, Contact guard assist Lower Body Bathing Details (indicate cue type and reason): simulated Upper Body Dressing : Set up Lower Body Dressing: Set up, Sitting/lateral leans Toilet Transfer: Minimal assistance, Rolling walker (2 wheels) Toileting- Clothing Manipulation and Hygiene: Set up, Sitting/lateral lean Functional mobility during ADLs: Minimal assistance, Rolling walker (2 wheels) General ADL Comments: min A to maintain balance while standing/ambulating due to poor balance. set up for UB/LB ADLs   Cognition: Cognition Overall Cognitive Status: Within Functional Limits for tasks assessed Orientation Level:  Oriented X4 Cognition Arousal: Alert Behavior During Therapy: WFL for tasks assessed/performed Overall Cognitive Status: Within Functional Limits for tasks assessed General Comments: pt able to verbalize that he had a CVA but unable to recall where in his brain or that he had been told this (which he had by last therapist). Reviewed this again and how it correlates to his symptoms   Physical Exam: Blood pressure (!) 163/73, pulse 78, temperature 98.4 F (36.9 C), temperature source Oral, resp. rate ROLLEN)  21, height 6' 1 (1.854 m), weight 104.7 kg, SpO2 95%. Physical Exam Constitutional: No apparent distress. Appropriate appearance for age.  Laying in bed. HENT: No JVD. Neck Supple. Trachea midline. Atraumatic, normocephalic.  Extremely mild right ptosis. +HOH R>L Eyes: PERRLA. EOMI. Visual fields grossly intact.  Cardiovascular: RRR, no murmurs/rub/gallops. No Edema. Peripheral pulses 2+  Respiratory: CTAB. No rales, rhonchi, or wheezing. On RA.  Abdomen: + bowel sounds, normoactive. No distention or tenderness.  GU: Not examined. + External catheter, draining clear urine.  Skin: C/D/I. No apparent lesions.  Peripheral IV intact. MSK:      No apparent deformity.  Active range of motion bilateral upper and lower extremities intact.       Neurologic exam:  Cognition: AAO to person, place, time and event.  Language: Fluent, No substitutions or neoglisms. No dysarthria. Names 3/3 objects correctly.  Memory: Recalls 3/3 objects at 5 minutes. No apparent deficits  Insight: Good  insight into current condition.  Mood: Pleasant affect, appropriate mood.  Joking, often dead pan, sometimes difficult to interpret. Sensation: To light touch intact in BL UEs and LEs  Reflexes: 2+ in BL UE and LEs. Negative Hoffman's and babinski signs bilaterally.  CN: 2-12 grossly intact.  Coordination: Extremely mild left upper extremity ataxia Spasticity: MAS 0 in all extremities.       Strength:                 RUE: 5/5 SA, 5/5 EF, 5/5 EE, 5/5 WE, 5/5 FF, 5/5 FA                LUE:  5/5 SA, 5/5 EF, 5/5 EE, 5/5 WE, 5/5 FF, 5/5 FA                RLE: 5/5 HF, 5/5 KE, 5/5  DF, 5/5  EHL, 5/5  PF                 LLE:  5/5 HF, 5/5 KE, 5/5  DF, 5/5  EHL, 5/5  PF         Lab Results Last 48 Hours        Results for orders placed or performed during the hospital encounter of 04/16/23 (from the past 48 hours)  Glucose, capillary     Status: Abnormal    Collection Time: 04/20/23  6:20 AM  Result Value Ref Range    Glucose-Capillary 256 (H) 70 - 99 mg/dL      Comment: Glucose reference range applies only to samples taken after fasting for at least 8 hours.  Glucose, capillary     Status: Abnormal    Collection Time: 04/20/23 12:11 PM  Result Value Ref Range    Glucose-Capillary 330 (H) 70 - 99 mg/dL      Comment: Glucose reference range applies only to samples taken after fasting for at least 8 hours.    Comment 1 Notify RN      Comment 2 Document in Chart    Glucose, capillary     Status: Abnormal    Collection Time: 04/20/23  4:51 PM  Result Value Ref Range    Glucose-Capillary 253 (H) 70 - 99 mg/dL      Comment: Glucose reference range applies only to samples taken after fasting for at least 8 hours.    Comment 1 Notify RN      Comment 2 Document in Chart    Glucose, capillary     Status: Abnormal    Collection Time:  04/20/23  9:15 PM  Result Value Ref Range    Glucose-Capillary 242 (H) 70 - 99 mg/dL      Comment: Glucose reference range applies only to samples taken after fasting for at least 8 hours.  Lipid panel     Status: Abnormal    Collection Time: 04/21/23  3:33 AM  Result Value Ref Range    Cholesterol 100 0 - 200 mg/dL    Triglycerides 879 <849 mg/dL    HDL 22 (L) >59 mg/dL    Total CHOL/HDL Ratio 4.5 RATIO    VLDL 24 0 - 40 mg/dL    LDL Cholesterol 54 0 - 99 mg/dL      Comment:        Total Cholesterol/HDL:CHD Risk Coronary Heart Disease Risk Table                     Men    Women  1/2 Average Risk   3.4   3.3  Average Risk       5.0   4.4  2 X Average Risk   9.6   7.1  3 X Average Risk  23.4   11.0        Use the calculated Patient Ratio above and the CHD Risk Table to determine the patient's CHD Risk.        ATP III CLASSIFICATION (LDL):  <100     mg/dL   Optimal  899-870  mg/dL   Near or Above                    Optimal  130-159  mg/dL   Borderline  839-810  mg/dL   High  >809     mg/dL   Very High Performed at [facility], [address], [place], [place] [address]    Glucose, capillary     Status: Abnormal    Collection Time: 04/21/23  6:47 AM  Result Value Ref Range    Glucose-Capillary 190 (H) 70 - 99 mg/dL      Comment: Glucose reference range applies only to samples taken after fasting for at least 8 hours.  Glucose, capillary     Status: Abnormal    Collection Time: 04/21/23 11:55 AM  Result Value Ref Range    Glucose-Capillary 276 (H) 70 - 99 mg/dL      Comment: Glucose reference range applies only to samples taken after fasting for at least 8 hours.  Glucose, capillary     Status: Abnormal    Collection Time: 04/21/23  4:52 PM  Result Value Ref Range    Glucose-Capillary 244 (H) 70 - 99 mg/dL      Comment: Glucose reference range applies only to samples taken after fasting for at least 8 hours.  Glucose, capillary     Status: Abnormal    Collection Time: 04/21/23  9:20 PM  Result Value Ref Range    Glucose-Capillary 226 (H) 70 - 99 mg/dL      Comment: Glucose reference range applies only to samples taken after fasting for at least 8 hours.    Comment 1 Notify RN      Comment 2 Document in Chart         Imaging Results (Last 48 hours)  ECHOCARDIOGRAM COMPLETE Result Date: 04/21/2023    ECHOCARDIOGRAM REPORT   Patient Name:   John [name] Date of Exam: 04/21/2023 Medical Rec #:  [id number]        Height:  73.0 in Accession #:    [id number]       Weight:       230.8 lb Date of Birth:  [birth year]        BSA:          2.287  m Patient Age:    77 years         BP:           138/61 mmHg Patient Gender: M                HR:           89 bpm. Exam Location:  Inpatient Procedure: 2D Echo, Cardiac Doppler, Color Doppler and Intracardiac            Opacification Agent Indications:    Stroke  History:        Patient has prior history of Echocardiogram examinations, most                 recent 11/14/2020. Signs/Symptoms:Edema and Fatigue; Risk                 Factors:Hypertension, Diabetes and Dyslipidemia.  Sonographer:    Juanita Shaw Referring Phys: [id number] SRISHTI L BHAGAT  Sonographer Comments: Image acquisition challenging due to patient body habitus. IMPRESSIONS  1. Left ventricular ejection fraction, by estimation, is 60 to 65%. Left ventricular ejection fraction by 2D MOD biplane is 61.9 %. The left ventricle has normal function. The left ventricle has no regional wall motion abnormalities. Left ventricular diastolic parameters are consistent with Grade I diastolic dysfunction (impaired relaxation).  2. Right ventricular systolic function is normal. The right ventricular size is normal. There is normal pulmonary artery systolic pressure. The estimated right ventricular systolic pressure is 25.7 mmHg.  3. The mitral valve is grossly normal. No evidence of mitral valve regurgitation.  4. The aortic valve is tricuspid. Aortic valve regurgitation is not visualized. Aortic valve sclerosis/calcification is present, without any evidence of aortic stenosis. Comparison(s): No significant change from prior study. 11/14/2020: LVEF 60-65%. FINDINGS  Left Ventricle: Left ventricular ejection fraction, by estimation, is 60 to 65%. Left ventricular ejection fraction by 2D MOD biplane is 61.9 %. The left ventricle has normal function. The left ventricle has no regional wall motion abnormalities. The left ventricular internal cavity size was normal in size. There is no left ventricular hypertrophy. Left ventricular diastolic parameters are consistent with  Grade I diastolic dysfunction (impaired relaxation). Indeterminate filling pressures. Right Ventricle: The right ventricular size is normal. No increase in right ventricular wall thickness. Right ventricular systolic function is normal. There is normal pulmonary artery systolic pressure. The tricuspid regurgitant velocity is 2.38 m/s, and  with an assumed right atrial pressure of 3 mmHg, the estimated right ventricular systolic pressure is 25.7 mmHg. Left Atrium: Left atrial size was normal in size. Right Atrium: Right atrial size was normal in size. Pericardium: There is no evidence of pericardial effusion. Mitral Valve: The mitral valve is grossly normal. No evidence of mitral valve regurgitation. MV peak gradient, 5.9 mmHg. The mean mitral valve gradient is 2.0 mmHg. Tricuspid Valve: The tricuspid valve is grossly normal. Tricuspid valve regurgitation is trivial. Aortic Valve: The aortic valve is tricuspid. Aortic valve regurgitation is not visualized. Aortic valve sclerosis/calcification is present, without any evidence of aortic stenosis. Aortic valve mean gradient measures 3.0 mmHg. Aortic valve peak gradient measures 4.4 mmHg. Aortic valve area, by VTI measures 2.96 cm. Pulmonic Valve: The pulmonic valve was grossly normal. Pulmonic  valve regurgitation is trivial. Aorta: The aortic root and ascending aorta are structurally normal, with no evidence of dilitation. IAS/Shunts: No atrial level shunt detected by color flow Doppler.  LEFT VENTRICLE PLAX 2D                        Biplane EF (MOD) LVIDd:         4.50 cm         LV Biplane EF:   Left LVIDs:         2.90 cm                          ventricular LV PW:         0.70 cm                          ejection LV IVS:        0.90 cm                          fraction by LVOT diam:     2.00 cm                          2D MOD LV SV:         63                               biplane is LV SV Index:   27                               61.9 %. LVOT Area:     3.14 cm                                 Diastology                                LV e' medial:    5.59 cm/s LV Volumes (MOD)               LV E/e' medial:  12.1 LV vol d, MOD    88.1 ml       LV e' lateral:   8.70 cm/s A2C:                           LV E/e' lateral: 7.8 LV vol d, MOD    88.4 ml A4C: LV vol s, MOD    30.1 ml A2C: LV vol s, MOD    36.7 ml A4C: LV SV MOD A2C:   58.0 ml LV SV MOD A4C:   88.4 ml LV SV MOD BP:    55.5 ml RIGHT VENTRICLE RV Basal diam:  3.70 cm RV Mid diam:    2.20 cm RV S prime:     13.40 cm/s TAPSE (M-mode): 3.2 cm LEFT ATRIUM             Index        RIGHT ATRIUM           Index LA diam:        3.60 cm 1.57  cm/m   RA Area:     12.30 cm LA Vol (A2C):   36.9 ml 16.14 ml/m  RA Volume:   25.20 ml  11.02 ml/m LA Vol (A4C):   42.5 ml 18.59 ml/m LA Biplane Vol: 43.7 ml 19.11 ml/m  AORTIC VALVE                    PULMONIC VALVE AV Area (Vmax):    2.85 cm     PV Vmax:          0.95 m/s AV Area (Vmean):   2.59 cm     PV Peak grad:     3.6 mmHg AV Area (VTI):     2.96 cm     PR End Diast Vel: 1.08 msec AV Vmax:           105.00 cm/s AV Vmean:          77.400 cm/s AV VTI:            0.212 m AV Peak Grad:      4.4 mmHg AV Mean Grad:      3.0 mmHg LVOT Vmax:         95.20 cm/s LVOT Vmean:        63.700 cm/s LVOT VTI:          0.200 m LVOT/AV VTI ratio: 0.94  AORTA Ao Root diam: 3.30 cm Ao Asc diam:  3.00 cm MITRAL VALVE                TRICUSPID VALVE MV Area (PHT): 3.51 cm     TR Peak grad:   22.7 mmHg MV Area VTI:   3.06 cm     TR Vmax:        238.00 cm/s MV Peak grad:  5.9 mmHg MV Mean grad:  2.0 mmHg     SHUNTS MV Vmax:       1.21 m/s     Systemic VTI:  0.20 m MV Vmean:      65.0 cm/s    Systemic Diam: 2.00 cm MV Decel Time: 216 msec MV E velocity: 67.60 cm/s MV A velocity: 103.00 cm/s MV E/A ratio:  0.66 Vinie Maxcy MD Electronically signed by Vinie Maxcy MD Signature Date/Time: 04/21/2023/11:05:34 AM    Final     CT ANGIO HEAD NECK W WO CM Result Date: 04/20/2023 CLINICAL DATA:  Stroke/TIA.   Determine embolic source. EXAM: CT ANGIOGRAPHY HEAD AND NECK WITH AND WITHOUT CONTRAST TECHNIQUE: Multidetector CT imaging of the head and neck was performed using the standard protocol during bolus administration of intravenous contrast. Multiplanar CT image reconstructions and MIPs were obtained to evaluate the vascular anatomy. Carotid stenosis measurements (when applicable) are obtained utilizing NASCET criteria, using the distal internal carotid diameter as the denominator. RADIATION DOSE REDUCTION: This exam was performed according to the departmental dose-optimization program which includes automated exposure control, adjustment of the mA and/or kV according to patient size and/or use of iterative reconstruction technique. CONTRAST:  75mL OMNIPAQUE  IOHEXOL  350 MG/ML SOLN COMPARISON:  MRI yesterday. FINDINGS: CT HEAD FINDINGS Brain: Chronic small-vessel ischemic changes affect the pons. Small acute infarction of the middle cerebellar peduncle on the right cannot be specifically seen by CT. Cerebral hemispheres show chronic small-vessel ischemic changes of the white matter without evidence of an acute stroke. No mass, hemorrhage, hydrocephalus or extra-axial collection. Vascular: There is atherosclerotic calcification of the major vessels at the base of the brain. Skull: Negative Sinuses/Orbits: Clear/normal Other:  None Review of the MIP images confirms the above findings CTA NECK FINDINGS Aortic arch: Aortic atherosclerosis.  Branching pattern is normal. Right carotid system: Common carotid artery widely patent to the bifurcation. Minimal atherosclerotic plaque at the bifurcation and ICA bulb but no stenosis. Cervical ICA widely patent. Left carotid system: Common carotid artery widely patent to the bifurcation. Calcified plaque at the bifurcation and proximal ICA bulb. Minimal diameter of the proximal ICA is 1.75 mm. Compared to a more distal cervical ICA diameter of 4.2 mm, this indicates a 60% stenosis.  Vertebral arteries: No proximal subclavian stenosis. Right vertebral artery origin is widely patent. The vessel appears normal through the cervical region to the foramen magnum. There is soft and calcified plaque at the left vertebral artery origin with a 50% stenosis. Beyond that, the vessel is patent through the cervical region to the foramen magnum. Skeleton: Minimal spondylosis. Other neck: No mass or lymphadenopathy. Upper chest: Chronic scarring in the anterior right upper lobe. Review of the MIP images confirms the above findings CTA HEAD FINDINGS Anterior circulation: Both internal carotid arteries are patent through the skull base and siphon regions. There is siphon atherosclerotic calcification with maximal stenosis estimated at 30% on both sides. The anterior and middle cerebral vessels are patent. No large vessel occlusion or flow limiting proximal stenosis. No aneurysm or vascular malformation. Posterior circulation: Both vertebral arteries are widely patent to the basilar artery. No basilar stenosis. Posterior circulation branch vessels are patent. There is moderate long segment stenosis of the proximal right PCA, but the vessel does show persistent flow. Venous sinuses: Patent and normal. Anatomic variants: None significant. Review of the MIP images confirms the above findings IMPRESSION: 1. No large vessel occlusion or flow limiting proximal intracranial stenosis. 2. 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 3. 50% stenosis of the left vertebral artery origin due to soft and calcified plaque. 4. 30% stenosis of both internal carotid artery siphon regions due to calcified plaque. 5. Moderate long segment stenosis of the proximal right PCA, but the vessel does show persistent flow. 6. Aortic atherosclerosis. 7. Chronic small-vessel ischemic changes of the pons and cerebral hemispheric white matter. Small acute infarction of the middle cerebellar peduncle on the right shown by MRI  cannot be specifically seen by CT. Aortic Atherosclerosis (ICD10-I70.0). Electronically Signed   By: Oneil Officer M.D.   On: 04/20/2023 11:22           Blood pressure (!) 163/73, pulse 78, temperature 98.4 F (36.9 C), temperature source Oral, resp. rate (!) 21, height 6' 1 (1.854 m), weight 104.7 kg, SpO2 95%.   Medical Problem List and Plan: 1. 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  Antithrombotics: -DVT/anticoagulation:  Mechanical: Antiembolism stockings, thigh (TED hose) Bilateral lower extremities             -antiplatelet therapy: Aspirin  81 mg daily and Plavix  75 mg daily x 3 weeks then Plavix  alone 3. Pain Management: Tylenol  as needed 4. Mood/Behavior/Sleep: Not emotional support             -antipsychotic agents: N/A 5. Neuropsych/cognition: This patient is capable of making decisions on his own behalf. 6. Skin/Wound  Care: Routine skin checks 7. Fluids/Electrolytes/Nutrition: Routine in and outs with follow-up chemistries 8.  Hypertension.  Norvasc  5 mg daily.  Monitor with increased mobility  9.  Diabetes mellitus peripheral neuropathy.  Latest hemoglobin A1c 12.3.  Currently on NovoLog  19 units twice daily.  Diabetic teaching.  Patient's home regimen of Glucophage  1000 mg twice daily as well as Toujeo  15 units in the morning and 70 units in the evening.  Resume as needed  10.  Chronic vertigo.  Continue Antivert  12.5 mg 3 times daily.  Patient followed outpatient by ENT  11.  BPH.  Proscar  5 mg daily, Flomax  0.4 mg daily.  Check PVR.  Followed by urology Dr. Evalene Nam [facility]  12.  History of breast cancer.  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  Status post modified radical mastectomy and chemotherapy.  Patient on tamoxifen  since April 2020 follow-up with  outpatient oncology Dr Leverette Vallathucherry and resume tamoxifen  as outpatient.  13.  GERD.  Protonix   14.  AKI on CKD.  Creatinine baseline 1.29-1.64.  Follow-up chemistries  15.  Obesity.  BMI 30.45.  Dietary follow-up   Toribio JINNY Pitch, PA-C 04/22/2023  I have examined the patient independently and edited the note for HPI, ROS, exam, assessment, and plan as appropriate. I am in agreement with the above recommendations.   Joesph JAYSON Likes, DO 04/22/2023

## 2023-04-22 NOTE — Progress Notes (Signed)
  Inpatient Rehabilitation Admissions Coordinator   I have insurance approval and CIR bed to admit him to today. I met with patient at bedside and he is in agreement. Acute team and TOC made aware. I will make the arrangements.  Heron Leavell, RN, MSN Rehab Admissions Coordinator [phone] 04/22/2023 10:14 AM

## 2023-04-22 NOTE — Progress Notes (Signed)
 Patient still unable to have bowel movement MD made aware new orders this am for sorbitol and miralax administered awaiting bowel movement.

## 2023-04-22 NOTE — Progress Notes (Signed)
 John Joesph BROCKS, DO  Physician Physical Medicine and Rehabilitation   PMR Pre-admission    Signed   Date of Service: 04/20/2023  1:57 PM  Related encounter: ED to Hosp-Admission (Current) from 04/16/2023 in [facility] 4E CV SURGICAL PROGRESSIVE CARE   Signed     Expand All Collapse All  Show:Clear all [x] Written[x] Templated[x] Copied  Added by: [x] Alison Heron MATSU, RN  [] Hover for details PMR Admission Coordinator Pre-Admission Assessment   Patient: John [name] is an 78 y.o., male MRN: [id number] DOB: 12/25/1945 Height: 6' 1 (185.4 cm) Weight: 103.3 kg                                                                                                              Insurance Information HMO:     PPO: yes     PCP:      IPA:      80/20:      OTHER:  PRIMARY: Blue Medicare      Policy#: [id number]      Subscriber: pt CM Name: Harlene      Phone#: [phone]     Fax#: [phone] Pre-Cert#: [id number] for 7 days 1/10 until 1/17 . Updates due 1/16     Employer:  Benefits:  Phone #: [phone]     Name: 1/8 Eff. Date: 04/13/23     Deduct: none      Out of Pocket Max: $5900      Life Max: none  CIR: $335 co pay per day days 1 until 5      SNF: no co pay per day days 1 until 20; $214 co pay per day days 21 until 60; no co pay per day days 61 until 100 Outpatient: $10 per visit     Co-Pay:  Home Health: 100%      Co-Pay:  DME: 80%     Co-Pay: 20% Providers: in network  SECONDARY: none   Financial Counselor:       Phone#:    The Data Processing Manager" for patients in Inpatient Rehabilitation Facilities with attached "Privacy Act Statement-Health Care Records" was provided and verbally reviewed with: Patient and Family   Emergency Contact Information Contact Information       Name Relation Home Work Mobile    [place] Spouse [phone]   [phone]    [name], [name]     [phone]         Other Contacts   None on File      Current Medical  History  Patient Admitting Diagnosis: CVA   History of Present Illness: 78 year old male with history of chronic vertigo, IDDM type II, essential HTN, HLD, peripheral neuropathy and breast cancer who presented on 04/16/23 with dizziness and nausea. Was unable to tolerate meclizine .    Labs notable with elevated blood glucose of 407 an anion gap of 19 with a slightly elevated beta hydroxybutyrate of 1.85. Improved with IVF and did not require insulin  drip.    Noted on PT evaluation with direction changing nystagmus and ataxia  with ambulation. MRI was delayed as patient refusing imaging initially and obtained on 1/7. 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 ASA prior to admit. Now on ASA and clopidogrel  daily for 3 weeks and then plavix  alone. Amlodopine for HTN. Atorvastatin  at home with LDL 18. Home meds Toujeo  and metformin  with Hgb A1c 12.4. SSI and CBGS and follow up with PCP for better DM control.    Complete NIHSS TOTAL: 0   Patient's medical record from [facility] has been reviewed by the rehabilitation admission coordinator and physician.   Past Medical History      Past Medical History:  Diagnosis Date   BPH (benign prostatic hyperplasia)     CTS (carpal tunnel syndrome)     Depression     Diabetes mellitus     Essential hypertension     GERD (gastroesophageal reflux disease)     History of breast cancer     Hypercholesteremia     Neuropathy          Has the patient had major surgery during 100 days prior to admission? No   Family History  family history includes Cancer in his mother, paternal grandmother, and sister; Diabetes in his father; Lymphoma in his mother; Ovarian cancer in his sister;  Stroke in his father.   Current Medications   Current Medications    Current Facility-Administered Medications:    acetaminophen  (TYLENOL ) tablet 650 mg, 650 mg, Oral, Q6H PRN, 650 mg at 04/18/23 2331 **OR** acetaminophen  (TYLENOL ) suppository 650 mg, 650 mg, Rectal, Q6H PRN, Sundil, Subrina, MD   amLODipine  (NORVASC ) tablet 5 mg, 5 mg, Oral, Daily, Sundil, Subrina, MD, 5 mg at 04/22/23 9187   aspirin  EC tablet 81 mg, 81 mg, Oral, Daily, Bhagat, Srishti L, MD, 81 mg at 04/22/23 9187   atorvastatin  (LIPITOR ) tablet 80 mg, 80 mg, Oral, QHS, Bhagat, Srishti L, MD, 80 mg at 04/21/23 2120   [COMPLETED] clopidogrel  (PLAVIX ) tablet 300 mg, 300 mg, Oral, Once, 300 mg at 04/20/23 0858 **FOLLOWED BY** clopidogrel  (PLAVIX ) tablet 75 mg, 75 mg, Oral, Daily, Bhagat, Srishti L, MD, 75 mg at 04/22/23 9187   finasteride  (PROSCAR ) tablet 5 mg, 5 mg, Oral, Daily, Sundil, Subrina, MD, 5 mg at 04/22/23 9187   furosemide  (LASIX ) tablet 40 mg, 40 mg, Oral, Daily, Regalado, Belkys A, MD   hydrALAZINE  (APRESOLINE ) injection 10 mg, 10 mg, Intravenous, Q6H PRN, Sundil, Subrina, MD, 10 mg at 04/18/23 0409   insulin  aspart (novoLOG ) injection 0-9 Units, 0-9 Units, Subcutaneous, TID WC, Samtani, Jai-Gurmukh, MD, 2 Units at 04/22/23 9372   insulin  aspart protamine- aspart (NOVOLOG  MIX 70/30) injection 19 Units, 19 Units, Subcutaneous, BID WC, Regalado, Belkys A, MD, 19 Units at 04/22/23 0810   meclizine  (ANTIVERT ) tablet 12.5 mg, 12.5 mg, Oral, TID PRN, Sundil, Subrina, MD, 12.5 mg at 04/19/23 2041   ondansetron  (ZOFRAN ) injection 4 mg, 4 mg, Intravenous, Q6H PRN, Sundil, Subrina, MD   pantoprazole  (PROTONIX ) EC tablet 40 mg, 40 mg, Oral, Daily, Sundil, Subrina, MD, 40 mg at 04/22/23 9187   polyethylene glycol (MIRALAX  / GLYCOLAX ) packet 17 g, 17 g, Oral, BID, Regalado, Belkys A, MD, 17 g at 04/22/23 9187   potassium chloride  SA (KLOR-CON  M) CR tablet 40 mEq, 40 mEq, Oral, Daily,  Samtani, Jai-Gurmukh, MD, 40 mEq at 04/21/23  9051   sodium chloride  flush (NS) 0.9 % injection 3 mL, 3 mL, Intravenous, Q12H, Sundil, Subrina, MD, 3 mL at 04/22/23 9095   sodium chloride  flush (NS) 0.9 % injection 3 mL, 3 mL, Intravenous, PRN, Sundil, Subrina, MD   tamsulosin  (FLOMAX ) capsule 0.4 mg, 0.4 mg, Oral, Daily, Sundil, Subrina, MD, 0.4 mg at 04/22/23 9187   triamcinolone  0.1 % cream : eucerin cream, 1:1, , Topical, BID, Regalado, Belkys A, MD, Given at 04/21/23 1740     Patients Current Diet:  Diet Order                  Diet Carb Modified Fluid consistency: Thin; Room service appropriate? Yes  Diet effective now                       Precautions / Restrictions Precautions Precautions: Fall Precaution Comments: R BPPV vs hypofunction vs central cause Restrictions Weight Bearing Restrictions Per Provider Order: No    Has the patient had 2 or more falls or a fall with injury in the past year?Yes   Prior Activity Level Community (5-7x/wk): Independent   Prior Functional Level Prior Function Prior Level of Function : Independent/Modified Independent, Driving, Patient poor historian/Family not available Mobility Comments: No AD ADLs Comments: Ind with ADLs per pt report   Self Care: Did the patient need help bathing, dressing, using the toilet or eating?  Independent   Indoor Mobility: Did the patient need assistance with walking from room to room (with or without device)? Independent   Stairs: Did the patient need assistance with internal or external stairs (with or without device)? Independent   Functional Cognition: Did the patient need help planning regular tasks such as shopping or remembering to take medications? Independent   Patient Information Are you of Hispanic, Latino/a,or Spanish origin?: A. No, not of Hispanic, Latino/a, or Spanish origin What is your race?: A. White Do you need or want an interpreter to communicate with a doctor or health care staff?: 0. No   Patient's Response To:  Health  Literacy and Transportation Is the patient able to respond to health literacy and transportation needs?: Yes Health Literacy - How often do you need to have someone help you when you read instructions, pamphlets, or other written material from your doctor or pharmacy?: Never In the past 12 months, has lack of transportation kept you from medical appointments or from getting medications?: No In the past 12 months, has lack of transportation kept you from meetings, work, or from getting things needed for daily living?: No   Home Assistive Devices / Equipment Home Equipment: Agricultural Consultant (2 wheels), BSC/3in1   Prior Device Use: Indicate devices/aids used by the patient prior to current illness, exacerbation or injury? None of the above   Current Functional Level Cognition   Overall Cognitive Status: Within Functional Limits for tasks assessed Orientation Level: Oriented X4 General Comments: pt able to verbalize that he had a CVA but unable to recall where in his brain or that he had been told this (which he had by last therapist). Reviewed this again and how it correlates to his symptoms    Extremity Assessment (includes Sensation/Coordination)   Upper Extremity Assessment: Overall WFL for tasks assessed  Lower Extremity Assessment: Defer to PT evaluation     ADLs   Overall ADL's : Needs assistance/impaired Eating/Feeding: Independent Grooming: Set up, Sitting Upper Body Bathing: Set up, Supervision/ safety  Upper Body Bathing Details (indicate cue type and reason): simulated Lower Body Bathing: Set up, Contact guard assist Lower Body Bathing Details (indicate cue type and reason): simulated Upper Body Dressing : Set up Lower Body Dressing: Set up, Sitting/lateral leans Toilet Transfer: Minimal assistance, Rolling walker (2 wheels) Toileting- Clothing Manipulation and Hygiene: Set up, Sitting/lateral lean Functional mobility during ADLs: Minimal assistance, Rolling walker (2  wheels) General ADL Comments: min A to maintain balance while standing/ambulating due to poor balance. set up for UB/LB ADLs     Mobility   Overal bed mobility: Needs Assistance Bed Mobility: Supine to Sit, Sit to Supine Rolling: Supervision Sidelying to sit: Min assist Supine to sit: Supervision Sit to supine: Supervision Sit to sidelying: Mod assist General bed mobility comments: supervision for safety     Transfers   Overall transfer level: Needs assistance Equipment used: Rolling walker (2 wheels) Transfers: Sit to/from Stand, Bed to chair/wheelchair/BSC Sit to Stand: Min assist Bed to/from chair/wheelchair/BSC transfer type:: Step pivot Step pivot transfers: Min assist General transfer comment: min A to maintain balance with RW     Ambulation / Gait / Stairs / Wheelchair Mobility   Ambulation/Gait Ambulation/Gait assistance: Mod assist Gait Distance (Feet): 70 Feet Assistive device: Rolling walker (2 wheels) Gait Pattern/deviations: Step-through pattern, Decreased stride length, Staggering right, Narrow base of support General Gait Details: mod A needed due to R lean as well as occasionally stepping out of RW with R foot and kicking RW. Balance very poor with changing direction and pt has diffciulty problem solving which way to turn. Gait velocity: dec Gait velocity interpretation: <1.31 ft/sec, indicative of household ambulator     Posture / Balance Dynamic Sitting Balance Sitting balance - Comments: EOB ADLs, slight posterior lean but able to self correct Balance Overall balance assessment: Needs assistance Sitting-balance support: No upper extremity supported, Feet supported Sitting balance-Leahy Scale: Fair Sitting balance - Comments: EOB ADLs, slight posterior lean but able to self correct Postural control: Posterior lean Standing balance support: Bilateral upper extremity supported, During functional activity, Reliant on assistive device for balance Standing  balance-Leahy Scale: Poor Standing balance comment: frequent LOB, min A to correct with RW High Level Balance Comments: worked on seated balance with pt throwing and catching small bottle. Pt had diffciulty grading his effort as well as coordinating B hands to catch. In standing, pt needed mod A during dynamic activity, worked on reaching in all planes. Pt very hesitant to reach fwd out of BOS.     Special needs/care consideration Hb A1c 12.4 Patient tends to joke around a lot and has to be redirected to stay on task        Previous Home Environment  Living Arrangements: Spouse/significant other  Lives With: Spouse Available Help at Discharge: Family, Available 24 hours/day Type of Home: House Home Layout: Two level, Bed/bath upstairs, Other (Comment) Home Access: Stairs to enter Entrance Stairs-Rails: Right Entrance Stairs-Number of Steps: 3 Bathroom Shower/Tub: Health Visitor: Handicapped height Home Care Services: No Additional Comments: verified with wife   Discharge Living Setting Plans for Discharge Living Setting: Patient's home, Lives with (comment), House Type of Home at Discharge: House Discharge Home Layout: Two level, Bed/bath upstairs Discharge Home Access: Stairs to enter Entrance Stairs-Rails: Right Entrance Stairs-Number of Steps: 3 Discharge Bathroom Shower/Tub: Walk-in shower Discharge Bathroom Toilet: Handicapped height Discharge Bathroom Accessibility: Yes How Accessible: Accessible via walker Does the patient have any problems obtaining your medications?: No   Social/Family/Support Systems Patient Roles:  Spouse Contact Information: wife Anticipated Caregiver: wife Anticipated Caregiver's Contact Information: see contacts Ability/Limitations of Caregiver: no limitations Caregiver Availability: 24/7 Discharge Plan Discussed with Primary Caregiver: Yes Is Caregiver In Agreement with Plan?: Yes Does Caregiver/Family have Issues with  Lodging/Transportation while Pt is in Rehab?: No   Goals Patient/Family Goal for Rehab: supervision with PT, OT and SLP Expected length of stay: ELOS 8 to 10 days Additional Information: Patient tends to joke around and has to be redirected frequently Pt/Family Agrees to Admission and willing to participate: Yes Program Orientation Provided & Reviewed with Pt/Caregiver Including Roles  & Responsibilities: Yes   Decrease burden of Care through IP rehab admission: n/a   Possible need for SNF placement upon discharge:not anticipated   Patient Condition: This patient's condition remains as documented in the consult dated 04/20/23, in which the Rehabilitation Physician determined and documented that the patient's condition is appropriate for intensive rehabilitative care in an inpatient rehabilitation facility. Will admit to inpatient rehab today.   Preadmission Screen Completed By:  Alison Heron Lot, RN MSN 04/22/2023 10:23 AM ______________________________________________________________________   Discussed status with Dr. Emeline on 04/22/23 at 1024 and received approval for admission today.   Admission Coordinator:  Alison Heron Lot, RN MSN time 1024 Date 04/22/23            Revision History  Routing History

## 2023-04-22 NOTE — TOC Transition Note (Signed)
 Transition of Care (TOC) - Discharge Note [name] RN, BSN Transitions of Care Unit 4E- RN Case Manager See Treatment Team for direct phone #   Patient Details  Name: John [name] MRN: [id number] Date of Birth: 03-02-1946  Transition of Care [facility]) CM/SW Contact:  [name], RN Phone Number: 04/22/2023, 12:12 PM   Clinical Narrative:    Notified by CIR liaison that pt has bed available for admit to rehab today. Auth received yesterday by insurance.   Pt stable for transition to [facility], plan to transition this afternoon.   No further TOC needs noted.    Final next level of care: IP Rehab Facility Barriers to Discharge: Barriers Resolved   Patient Goals and CMS Choice Patient states their goals for this hospitalization and ongoing recovery are:: rehab then return home CMS Medicare.gov Compare Post Acute Care list provided to:: Patient Choice offered to / list presented to : Patient, Spouse      Discharge Placement                 [facility]      Discharge Plan and Services Additional resources added to the After Visit Summary for   In-house Referral: Clinical Social Work   Post Acute Care Choice: Skilled Nursing Facility, IP Rehab          DME Arranged: N/A DME Agency: NA       HH Arranged: NA HH Agency: NA        Social Drivers of Health (SDOH) Interventions SDOH Screenings   Food Insecurity: No Food Insecurity (04/17/2023)  Housing: Low Risk  (04/17/2023)  Transportation Needs: No Transportation Needs (04/17/2023)  Utilities: Not At Risk (04/17/2023)  Alcohol Screen: Low Risk  (01/27/2023)  Depression (PHQ2-9): Low Risk  (01/28/2023)  Financial Resource Strain: Low Risk  (04/07/2023)  Physical Activity: Unknown (04/07/2023)  Social Connections: Moderately Isolated (04/17/2023)  Stress: No Stress Concern Present (04/07/2023)  Tobacco Use: Low Risk  (04/17/2023)     Readmission Risk Interventions    04/22/2023   12:12 PM   Readmission Risk Prevention Plan  Medication Screening Complete  Transportation Screening Complete

## 2023-04-23 DIAGNOSIS — Z794 Long term (current) use of insulin: Secondary | ICD-10-CM

## 2023-04-23 DIAGNOSIS — N400 Enlarged prostate without lower urinary tract symptoms: Secondary | ICD-10-CM | POA: Diagnosis not present

## 2023-04-23 DIAGNOSIS — E1169 Type 2 diabetes mellitus with other specified complication: Secondary | ICD-10-CM | POA: Diagnosis not present

## 2023-04-23 DIAGNOSIS — I10 Essential (primary) hypertension: Secondary | ICD-10-CM

## 2023-04-23 DIAGNOSIS — I63511 Cerebral infarction due to unspecified occlusion or stenosis of right middle cerebral artery: Secondary | ICD-10-CM | POA: Diagnosis not present

## 2023-04-23 LAB — GLUCOSE, CAPILLARY
Glucose-Capillary: 230 mg/dL — ABNORMAL HIGH (ref 70–99)
Glucose-Capillary: 239 mg/dL — ABNORMAL HIGH (ref 70–99)
Glucose-Capillary: 268 mg/dL — ABNORMAL HIGH (ref 70–99)
Glucose-Capillary: 243 mg/dL — ABNORMAL HIGH (ref 70–99)

## 2023-04-23 MED ORDER — METFORMIN HCL ER 500 MG PO TB24
500.0000 mg | ORAL_TABLET | Freq: Two times a day (BID) | ORAL | Status: DC
  Administered 2023-04-23 – 2023-05-07 (×27): 500 mg via ORAL
  Filled 2023-04-23 (×28): qty 1

## 2023-04-23 NOTE — Plan of Care (Signed)
  Problem: Consults Goal: RH STROKE PATIENT EDUCATION Description: See Patient Education module for education specifics  Outcome: Progressing   Problem: RH BOWEL ELIMINATION Goal: RH STG MANAGE BOWEL WITH ASSISTANCE Description: STG Manage Bowel with toileting Assistance. Outcome: Progressing Goal: RH STG MANAGE BOWEL W/MEDICATION W/ASSISTANCE Description: STG Manage Bowel with Medication with mod I Assistance. Outcome: Progressing   Problem: RH BLADDER ELIMINATION Goal: RH STG MANAGE BLADDER WITH ASSISTANCE Description: STG Manage Bladder With toileting Assistance Outcome: Progressing Goal: RH STG MANAGE BLADDER WITH MEDICATION WITH ASSISTANCE Description: STG Manage Bladder With Medication With mod I Assistance. Outcome: Progressing   Problem: RH SAFETY Goal: RH STG ADHERE TO SAFETY PRECAUTIONS W/ASSISTANCE/DEVICE Description: STG Adhere to Safety Precautions With cues Assistance/Device. Outcome: Progressing   Problem: RH PAIN MANAGEMENT Goal: RH STG PAIN MANAGED AT OR BELOW PT'S PAIN GOAL Description: < 4 with prns Outcome: Progressing   Problem: RH KNOWLEDGE DEFICIT Goal: RH STG INCREASE KNOWLEDGE OF DIABETES Description: Patient and family will be able to manage DM using educational resources for medications and diet modification independently Outcome: Progressing Goal: RH STG INCREASE KNOWLEDGE OF HYPERTENSION Description: Patient and family will be able to manage HTN using educational resources for medications and diet modification independently Outcome: Progressing Goal: RH STG INCREASE KNOWLEGDE OF HYPERLIPIDEMIA Description: Patient and family will be able to manage HLD using educational resources for medications and diet modification independently Outcome: Progressing Goal: RH STG INCREASE KNOWLEDGE OF STROKE PROPHYLAXIS Description: Patient and family will be able to manage secondary risks using educational resources for medications and diet modification  independently Outcome: Progressing

## 2023-04-23 NOTE — Progress Notes (Signed)
 Occupational Therapy Session Note  Patient Details  Name: John [name] MRN: [id number] Date of Birth: May 29, 1945  Today's Date: 04/23/2023 OT Individual Time: 8447-8364 OT Individual Time Calculation (min): 43 min    Short Term Goals: Week 1:  OT Short Term Goal 1 (Week 1): STGs=LTGs due patient's estimated length of stay.  Skilled Therapeutic Interventions/Progress Updates:    Pt received in bed with purewick on.  Suggested pt have a shower. Pt stated he was not ready for a shower and wants to wait until he is feeling stronger. Explained to pt he could sit, but he also did not want to worry about removing the purewick.  Explained that he should have it removed tomorrow when his primary PT/OT team is here so he can shower and move more actively. Recommended pt try to toilet every 2 hours to get his continence back.  Pt stated I have been stuck in a bed for a week, I am so weak.   He was agreeable to doing exercises to work on balance skills at EOB.  Sat to EOB with CGA.  On first few sit to stands he relied heavily on bed to support his legs. Then used RW with cues to scoot hips closer to EOB.  He needs min A to safely sit to stand. In standing, with RW support, he worked on front toe taps, marching in place,  lateral steps.  After a few minutes pt stated he was quite fatigued.  Had pt work on LE AROM with towel slides on floor, moving feet back and forth and in circles.  Pt did well with these exercises.   Spent time discussing general inpt rehab process, expected outcomes,  goals.   Overall pt did participate well despite his initial hesitancy.  Pt resting in bed with all needs met. Alarm set and call light in reach.    Therapy Documentation Precautions:  Precautions Precautions: Fall Precaution Comments: Chronic vertigo; HOH Restrictions Weight Bearing Restrictions Per Provider Order: No  Vital Signs: Therapy Vitals Pulse Rate: 94 BP: 132/75 Patient Position (if  appropriate): Sitting Pain: Pain Assessment Pain Scale: 0-10 Pain Score: 0-No pain  Therapy/Group: Individual Therapy  John [name] 04/23/2023, 12:24 PM

## 2023-04-23 NOTE — Evaluation (Signed)
 Occupational Therapy Assessment and Plan  Patient Details  Name: John [name] MRN: [id number] Date of Birth: 22-Mar-1946  OT Diagnosis: muscle weakness (generalized), decreased activity tolerance, and decreased balance strategies  Rehab Potential: Rehab Potential (ACUTE ONLY): Good ELOS: 7-10 days   Today's Date: 04/23/2023 OT Individual Time: 8964-8867 OT Individual Time Calculation (min): 57 min  and Today's Date: 04/23/2023 OT Missed Time: 15 Minutes Missed Time Reason: Other (comment) (Missed minutes due to delay in care.)    Hospital Problem: Principal Problem:   Right middle cerebral artery stroke [facility])   Past Medical History:  Past Medical History:  Diagnosis Date   BPH (benign prostatic hyperplasia)    CTS (carpal tunnel syndrome)    Depression    Diabetes mellitus    Essential hypertension    GERD (gastroesophageal reflux disease)    History of breast cancer    Hypercholesteremia    Neuropathy    Past Surgical History:  Past Surgical History:  Procedure Laterality Date   CATARACT EXTRACTION, BILATERAL  01/18/2017   LITHOTRIPSY     MASTECTOMY Right 05/02/2018   TONSILLECTOMY AND ADENOIDECTOMY      Assessment & Plan Clinical Impression: Patient is a 78 year old right-handed male with history significant for 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 Per chart review patient lives with spouse. Two-level home bed and bath upstairs 3 steps to entry. Independent prior to admission. Presented 04/16/2023 with recent follow-up with PCP for shortness of breath as well as dry heaving/dizziness with nausea and inability to tolerate p.o. Cranial CT scan showed no acute abnormalities. Patient did not  receive tPA. 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 CTA head and neck with no large vessel occlusion or flow-limiting proximal intracranial stenosis. 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 50% stenosis of the left vertebral artery origin due to soft and calcified plaque. Admission chemistries unremarkable except sodium 134 chloride 94 glucose 407 BUN 31 creatinine 1.34, elevated betahydroxybutyrate level at 1.85, which improved with IV fluid, hemoglobin A1c of 12.4 04/11/2023. Echocardiogram ejection fraction of 60 to 65% no wall motion abnormalities grade 1 diastolic dysfunction. Neurology follow-up placed on low-dose aspirin  81 mg daily and Plavix  75 mg daily for CVA prophylaxis x 3 weeks then Plavix  alone. Hospital course AKI on CKD improved with gentle IV fluids labs creatinine 1.22.  Patient transferred to CIR on 04/22/2023 .    Patient currently requires CGA-Min A with basic self-care skills secondary to muscle weakness, decreased cardiorespiratoy endurance, decreased coordination, and decreased sitting balance, decreased standing balance, and decreased balance strategies.  Prior to hospitalization, patient could complete BADLs/IADLs independently.   Patient will benefit from skilled intervention to increase independence with basic self-care skills and increase level of independence with iADL prior to discharge home with care partner.  Anticipate patient will require intermittent supervision and no further OT follow recommended.  OT - End of Session Activity Tolerance: Tolerates < 10 min activity with changes in vital signs Endurance Deficit: Yes Endurance Deficit Description: Out of breath with room-level mobility. OT Assessment Rehab Potential (ACUTE ONLY): Good OT Barriers to Discharge: Weight OT Patient demonstrates impairments in the following area(s): Balance;Edema;Endurance;Motor;Perception;Safety;Sensory;Skin Integrity OT Basic ADL's  Functional Problem(s): Bathing;Dressing;Toileting OT Advanced ADL's Functional  Problem(s): Simple Meal Preparation OT Transfers Functional Problem(s): Toilet;Tub/Shower OT Plan OT Intensity: Minimum of 1-2 x/day, 45 to 90 minutes OT Frequency: 5 out of 7 days OT Duration/Estimated Length of Stay: 7-10 days OT Treatment/Interventions: Balance/vestibular training;Cognitive remediation/compensation;Community reintegration;Discharge planning;Disease mangement/prevention;DME/adaptive equipment instruction;Functional electrical stimulation;Functional mobility training;Neuromuscular re-education;Pain management;Patient/family education;Psychosocial support;Splinting/orthotics;Skin care/wound managment;Self Care/advanced ADL retraining;Therapeutic Activities;Therapeutic Exercise;UE/LE Strength taining/ROM;Visual/perceptual remediation/compensation;UE/LE Coordination activities OT Basic Self-Care Anticipated Outcome(s): Mod I OT Toileting Anticipated Outcome(s): Mod I OT Bathroom Transfers Anticipated Outcome(s): Mod I OT Recommendation Follow Up Recommendations: None Equipment Recommended: To be determined   OT Evaluation Precautions/Restrictions  Precautions Precautions: Fall Precaution Comments: Chronic vertigo; HOH Restrictions Weight Bearing Restrictions Per Provider Order: No General Chart Reviewed: Yes Family/Caregiver Present: No Vital Signs Therapy Vitals Pulse Rate: 94 BP: 132/75 Patient Position (if appropriate): Sitting Pain Pain Assessment Pain Scale: 0-10 Pain Score: 0-No pain Home Living/Prior Functioning Home Living Available Help at Discharge: Family, Available 24 hours/day Type of Home: House Home Access: Stairs to enter [employer] of Steps: 3 Entrance Stairs-Rails: Right, Left, Can reach both Home Layout: Two level, Other (Comment), Able to live on main level with bedroom/bathroom (Sleeps in recliner instead of bed upstairs.) Bathroom Shower/Tub:  Walk-in shower, Curtain (No seat or grab bars.) Bathroom Toilet: Handicapped height Bathroom Accessibility: No  Lives With: Spouse IADL History Homemaking Responsibilities: Yes Meal Prep Responsibility: Primary Homemaking Comments: Enjoys cooking. Current License: Yes Occupation: Retired Leisure and Hobbies: Spending time on the computer and getting back to reading. Prior Function Level of Independence: Independent with basic ADLs, Independent with homemaking with ambulation, Independent with gait, Independent with transfers Driving: Yes Vocation: Retired Administrator, Sports Baseline Vision/History: 1 Wears glasses Ability to See in Adequate Light: 1 Impaired Patient Visual Report: No change from baseline Vision Assessment?: No apparent visual deficits Perception  Perception: Within Functional Limits Praxis Praxis: WFL Cognition Cognition Overall Cognitive Status: Within Functional Limits for tasks assessed Arousal/Alertness: Awake/alert Memory: Appears intact Awareness: Appears intact Problem Solving: Appears intact Safety/Judgment: Appears intact Brief Interview for Mental Status (BIMS) Repetition of Three Words (First Attempt): 3 Temporal Orientation: Year: Correct Temporal Orientation: Month: Accurate within 5 days Temporal Orientation: Day: Correct Recall: Sock: No, could not recall Recall: Blue: Yes, no cue required Recall: Bed: Yes, after cueing (a piece of furniture) BIMS Summary Score: 12 Sensation Sensation Light Touch: Impaired Detail Peripheral sensation comments: Reports neuropathy in BUE<BLE since chemotherapy. Light Touch Impaired Details: Impaired RUE;Impaired LUE;Impaired RLE;Impaired LLE Coordination Gross Motor Movements are Fluid and Coordinated: No Fine Motor Movements are Fluid and Coordinated: Yes Motor  Motor Motor: Within Functional Limits  Trunk/Postural Assessment  Cervical Assessment Cervical Assessment: Within Functional Limits Thoracic  Assessment Thoracic Assessment: Exceptions to [facility] (Rounded shoulders) Lumbar Assessment Lumbar Assessment: Within Functional Limits Postural Control Postural Control: Deficits on evaluation Trunk Control: Decreased control when BLE not supported on floor.  Balance Balance Balance Assessed: Yes Static Sitting Balance Static Sitting - Balance Support: Feet supported Static Sitting - Level of Assistance: 5: Stand by assistance (SUP) Static Standing Balance Static Standing - Balance Support: During functional activity;Bilateral upper extremity supported Static Standing - Level of Assistance: 5: Stand by assistance (CGA) Dynamic Standing Balance Dynamic Standing - Balance Support: During functional activity Dynamic Standing - Level of Assistance: 5: Stand by assistance;4: Min assist (CGA-Min A) Extremity/Trunk Assessment RUE Assessment RUE Assessment: Exceptions to [facility] Active Range of Motion (AROM) Comments: WFL General Strength Comments: WFL grossly; 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 LUE Assessment LUE Assessment: Exceptions to  WFL Active Range of Motion (AROM) Comments: WFL General Strength Comments: WFL grossly; 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  Care Tool Care Tool Self Care-SEE FLOWSHEETS  Refer to Care Plan for Long Term Goals  SHORT TERM GOAL WEEK 1 OT Short Term Goal 1 (Week 1): STGs=LTGs due patient's estimated length of stay.  Recommendations for other services: None    Skilled Therapeutic Intervention Session began with introduction to OT role, OT POC, and general orientation to rehab unit/schedule. Pt defers full-shower until PM session, levels of assistance below based on clinical judgement and simulated movements. Pt performs room-level mobility to access bathroom with CGA & Min A for sit>stand, all with RW. Pt resistive to removing male-external catheter, communicated this to RN, patient made aware of need to start voiding in  toilet due to current functional mobility. Pt receptive, just wary of pain associated with removing. Pt endorses episodes of staggering while in standing or ambulating at home, citing chemo, as potential cause, no episodes present during session. No s/s of chronic vertigo.   ADL ADL Eating: Supervision/safety;Set up Where Assessed-Eating: Bed level Grooming: Supervision/safety;Setup Where Assessed-Grooming: Edge of bed Upper Body Bathing: Supervision/safety;Setup Where Assessed-Upper Body Bathing: Edge of bed Lower Body Bathing: Minimal assistance;Moderate assistance Where Assessed-Lower Body Bathing: Edge of bed Upper Body Dressing: Supervision/safety;Setup Where Assessed-Upper Body Dressing: Edge of bed Lower Body Dressing: Minimal assistance;Moderate assistance Where Assessed-Lower Body Dressing: Edge of bed Toileting: Contact guard;Minimal assistance Where Assessed-Toileting: Teacher, Adult Education: Scientific Laboratory Technician Method: Proofreader: Other (comment);Bedside commode (RW) Tub/Shower Transfer: Unable to assess Film/video Editor: Unable to assess Mobility  Transfers Sit to Stand: Minimal Assistance - Patient > 75% Stand to Sit: Contact Guard/Touching assist   Discharge Criteria: Patient will be discharged from OT if patient refuses treatment 3 consecutive times without medical reason, if treatment goals not met, if there is a change in medical status, if patient makes no progress towards goals or if patient is discharged from hospital.  The above assessment, treatment plan, treatment alternatives and goals were discussed and mutually agreed upon: by patient  [name], OTR/L, MSOT  04/23/2023, 11:31 AM

## 2023-04-23 NOTE — Plan of Care (Signed)
  Problem: Consults Goal: RH STROKE PATIENT EDUCATION Description: See Patient Education module for education specifics  Outcome: Progressing   Problem: RH BOWEL ELIMINATION Goal: RH STG MANAGE BOWEL WITH ASSISTANCE Description: STG Manage Bowel with toileting Assistance. Outcome: Progressing Goal: RH STG MANAGE BOWEL W/MEDICATION W/ASSISTANCE Description: STG Manage Bowel with Medication with mod I Assistance. Outcome: Progressing   Problem: RH BLADDER ELIMINATION Goal: RH STG MANAGE BLADDER WITH ASSISTANCE Description: STG Manage Bladder With toileting Assistance Outcome: Progressing Goal: RH STG MANAGE BLADDER WITH MEDICATION WITH ASSISTANCE Description: STG Manage Bladder With Medication With mod I Assistance. Outcome: Progressing   Problem: RH SAFETY Goal: RH STG ADHERE TO SAFETY PRECAUTIONS W/ASSISTANCE/DEVICE Description: STG Adhere to Safety Precautions With cues Assistance/Device. Outcome: Progressing

## 2023-04-23 NOTE — Progress Notes (Signed)
 PROGRESS NOTE   Subjective/Complaints: No acute events overnight.  Denies pain this morning.  Reports he had a bowel movement yesterday.  No additional concerns.     ROS: Patient denies fever, new vision changes, dizziness, nausea, vomiting, diarrhea,  shortness of breath or chest pain, headache, or mood change.    Objective:   No results found. No results for input(s): WBC, HGB, HCT, PLT in the last 72 hours. Recent Labs    04/22/23 0905  NA 137  K 4.4  CL 102  CO2 22  GLUCOSE 223*  BUN 10  CREATININE 0.95  CALCIUM  9.2    Intake/Output Summary (Last 24 hours) at 04/23/2023 1008 Last data filed at 04/23/2023 0900 Gross per 24 hour  Intake 600 ml  Output 300 ml  Net 300 ml        Physical Exam: Vital Signs Blood pressure 132/75, pulse 84, temperature 98.4 F (36.9 C), resp. rate 16, height 6' (1.829 m), weight 101.9 kg, SpO2 94%.    Physical Exam Constitutional: NAD, laying in bed HENT: NCAT,  Extremely mild right ptosis. +HOH R>L Cardiovascular: RRR, no murmurs/rub/gallops.  Respiratory: CTAB.  Nonlabored breathing on room air Abdomen: + bowel sounds, normoactive. No distention or tenderness.  Skin: C/D/I. No apparent lesions.  MSK:      No joint swelling noted       Mood: Pleasant affect, appropriate mood. Neurologic exam: Alert and oriented x 3,  no dysarthria, cranial nerves II through XII grossly intact Moving all 4 extremities to gravity and resistance Sensation intact to light touch in all 4 extremities   Assessment/Plan: 1. Functional deficits which require 3+ hours per day of interdisciplinary therapy in a comprehensive inpatient rehab setting. Physiatrist is providing close team supervision and 24 hour management of active medical problems listed below. Physiatrist and rehab team continue to assess barriers to discharge/monitor patient progress toward functional and medical  goals  Care Tool:  Bathing              Bathing assist       Upper Body Dressing/Undressing Upper body dressing        Upper body assist      Lower Body Dressing/Undressing Lower body dressing            Lower body assist       Toileting Toileting    Toileting assist       Transfers Chair/bed transfer  Transfers assist           Locomotion Ambulation   Ambulation assist              Walk 10 feet activity   Assist           Walk 50 feet activity   Assist           Walk 150 feet activity   Assist           Walk 10 feet on uneven surface  activity   Assist           Wheelchair     Assist               Wheelchair 50 feet  with 2 turns activity    Assist            Wheelchair 150 feet activity     Assist          Blood pressure 132/75, pulse 84, temperature 98.4 F (36.9 C), resp. rate 16, height 6' (1.829 m), weight 101.9 kg, SpO2 94%.  Medical Problem List and Plan: 1. 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  Antithrombotics: -DVT/anticoagulation:  Mechanical: Antiembolism stockings, thigh (TED hose) Bilateral lower extremities             -antiplatelet therapy: Aspirin  81 mg daily and Plavix  75 mg daily x 3 weeks then Plavix  alone 3. Pain Management: Tylenol  as needed 4. Mood/Behavior/Sleep: Not emotional support             -antipsychotic agents: N/A 5. Neuropsych/cognition: This patient is capable of making decisions on his own behalf. 6. Skin/Wound Care: Routine skin checks 7. Fluids/Electrolytes/Nutrition: Routine in and outs with follow-up chemistries 8.  Hypertension.  Norvasc  5 mg daily.  Monitor with increased mobility  1/11 intermittently elevated continue to monitor       04/23/2023    8:41 AM 04/23/2023    4:30 AM 04/22/2023    7:53 PM  Vitals with BMI  Systolic 132 151 874  Diastolic 75 72 63  Pulse  84 84      9.  Diabetes mellitus peripheral neuropathy.  Latest hemoglobin A1c 12.3.  Currently on NovoLog  19 units twice daily.  Diabetic teaching.  Patient's home regimen of Glucophage  1000 mg twice daily as well as Toujeo  15 units in the morning and 70 units in the evening.  Resume as needed  1/11 CBGs mildly elevated, restart metformin  XR at 500 mg dose, continue current dose of 70/30 for now   10.  Chronic vertigo.  Continue Antivert  12.5 mg 3 times daily.  Patient followed outpatient by ENT   11.  BPH.  Proscar  5 mg daily, Flomax  0.4 mg daily.  Check PVR.  Followed by urology Dr. Evalene Nam [facility]  -1/11 continent continue current regimen   12.  History of breast cancer.  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  Status post modified radical mastectomy and chemotherapy.  Patient on tamoxifen  since April 2020 follow-up with outpatient oncology Dr Leverette Vallathucherry and resume tamoxifen  as outpatient.   13.  GERD.  Protonix    14.  AKI on CKD.  Creatinine baseline 1.29-1.64.  Follow-up chemistries   15.  Obesity.  BMI 30.45.  Dietary follow-up      LOS: 1 days A FACE TO FACE EVALUATION WAS PERFORMED  John [name] 04/23/2023, 10:08 AM

## 2023-04-24 DIAGNOSIS — K5901 Slow transit constipation: Secondary | ICD-10-CM | POA: Diagnosis not present

## 2023-04-24 DIAGNOSIS — I63511 Cerebral infarction due to unspecified occlusion or stenosis of right middle cerebral artery: Secondary | ICD-10-CM | POA: Diagnosis not present

## 2023-04-24 DIAGNOSIS — R739 Hyperglycemia, unspecified: Secondary | ICD-10-CM

## 2023-04-24 LAB — GLUCOSE, CAPILLARY
Glucose-Capillary: 229 mg/dL — ABNORMAL HIGH (ref 70–99)
Glucose-Capillary: 280 mg/dL — ABNORMAL HIGH (ref 70–99)
Glucose-Capillary: 222 mg/dL — ABNORMAL HIGH (ref 70–99)
Glucose-Capillary: 223 mg/dL — ABNORMAL HIGH (ref 70–99)

## 2023-04-24 MED ORDER — TAMOXIFEN CITRATE 10 MG PO TABS
20.0000 mg | ORAL_TABLET | Freq: Every day | ORAL | Status: DC
  Administered 2023-04-24 – 2023-05-07 (×14): 20 mg via ORAL
  Filled 2023-04-24 (×14): qty 2

## 2023-04-24 NOTE — Progress Notes (Signed)
 Occupational Therapy Session Note  Patient Details  Name: John [name] MRN: [id number] Date of Birth: 1945/12/29  Today's Date: 04/24/2023 OT Individual Time: 8983-8884 OT Individual Time Calculation (min): 59 min  OT Individual Time: 8695-8587 OT Individual Time Calculation (min): 68 min   Short Term Goals: Week 1:  OT Short Term Goal 1 (Week 1): STGs=LTGs due patient's estimated length of stay.  Skilled Therapeutic Interventions/Progress Updates:     AM Session: Pt received sitting up in recliner requesting for nursing staff to come in and set-up Purewick so that he can void at beginning of session. Education provided on discontinuing use of purwick to begin retraining bladder and getting on a toileting schedule with Pt receptive to using toilet during session and allowed for OT to remove external purwick attachment, however Pt not very excited about this. With mod encouragement and motivation, Pt receptive to skilled OT session reporting 0/10 pain- OT offering intermittent rest breaks, repositioning, and therapeutic support to optimize participation in therapy session. Pt receptive to taking shower this AM. Focus this session BADL retraining, activity tolerance, and Pt education.   Sit>stand from recliner using RW CGA +min verbal cues for hand placement. Pt presenting with posterior lean in standing with verbal and tactile cues required to facilitate improved balance and weight shifting over BOS. Pt ambulated to bathroom using RW with min A provided for balance- Pt presenting with R lateral and posterior lean, decreased lateral weight shifting, and decreased step height. Pt transferred to [facility] positioned over toilet with min A provided for balance and verbal cues required for RW and R foot positioning d/t R inattention. Pt with continent void documented in flowsheets. Pt doffed gown seated on toilet supervision. Sit>stand from toilet to RW CGA. Upon standing, Pt with heavy posteriorly lean  requiring mod A to correct to over posterior LOB. Ambulatory transfer into walk-in shower using RW min A for balance and mod verbal cues for problem solving and technique d/t first learning experience.   Education provided on energy conservation and fall prevention as it relates to CVA recovery. Also provided education on conserving energy during shower by completing bathing tasks sitting down, opening bathroom door to increase ventilation, and taking a cooler shower with Pt receptive to all education. Pt completed majority of shower in seated position for energy conservation completing UB bathing with supervision, mod verbal cues for sequencing, attention, and problem solving. Pt stood while holding grab bar to wash buttocks with heavy min A required for balance and Pt heavily relying on posterior portion of legs against TTB to maintain balance. Min A required for LB bathing to ensure cleanliness. Pt extremely fatigued following shower with cool water and prolonged seated rest break provided. Pt stating this is the toughest thing I've done the entire time I've been here. Following rest break, Pt able to complete functional mobility back to room with min A provided for balance.   D/t fatigue, Pt returned to bed for short rest break prior to dressing tasks. Pt presenting with poor activity tolerance and decreased endurance required for BADLs. Pt donned clean brief sitting EOB with min A required for balance when bringing pants to waist in standing position using RW. Pt donned clean shirt with supervision sitting EOB.   Pt requesting to return to bed at end of session. EOB>supine supervision. Pt was left resting in bed with call bell in reach, bed alarm on, and all needs met.    PM Session:  Pt received sitting up in  bed presenting to be in good spirits receptive to skilled OT session reporting 0/10 pain, although headache reported- OT offering intermittent rest breaks, repositioning, and therapeutic  support to optimize participation in therapy session. Focus this session activity tolerance, dynamic balance, BADL retraining, and general strengthening.  Pt transitioned to EOB with supervision +increased time. Pt wearing clean shirt and brief upon OT arrival, however without pants on. Pt donned clean pants sitting EOB with min A required for balance when standing to bring pants to waist using RW for balance. Pt presenting with posterior lean and relying in B LEs supported against bed to maintain balance. OT donned socks and shoes sitting EOB total A for time management.   Pt completed sit > stand from EOB using RW min A, Engaged Pt in completing functional mobility to wc positioned in hallway for endurance training ~32ft using RW with min A required for balance d/t posterior lean and mod verbal cues for foot height and RW management. Transported Pt remainder of the way to therapy gym in wc for time management and energy conservation.  Stand pivot wc>EOM using RW light min A for balance.   Engaged Pt in dynamic standing balance horse shoe toss activity without AD with blocked practice of sit<>stands incorporated into activity to increase safety and independence in functional transfers and BADLs. Pt tasked with maintaining standing balance while throwing horse shoe at target without UE support and completing sit<>stand between each trial to retrieve nect horse shoe from mat table. Education provided on sit<>stand technique with emphasis on nose over toes to increase anterior trunk flexion and weight shifting prior to stand. Pt able to complete total of 1x10 reps with mod verbal and tactile cues required with facilitate anterior weight shifting into toes, improved trunk positioning, and controlled decent with CGA provided during sit <> stands. CGA to min A required to maintain dynamic standing balance with Pt demonstrating decreased body awareness and increased challenges weight shifting forwards. Pt also  attempting to rely on posterior portion of B LEs leaning on mat to maintain balance with consistent cues required to avoid this.   Engaged Pt in completing seated B UE exercises using 5# weighted dowel to increase B UE strength and endurance for BADLs. Pt completed 1x10 reps of the following exercises with OT providing a visual model and verbal cues for technique- attempted to complete in standing position initially, however d/t dual tasking challenge Pt unable to safety complete in standing so exercises completed in seated position: -Bicep curls -Anterior shoulder flexion -Overhead Press -Chest Press  Pt requesting to use bathroom and to return to room d/t fatigue. Stand pivot EOM>wc using RW CGA. Transported Pt back to room total A in wc for energy conservation.   Pt completed functional mobility to bathroom using RW with CGA- improved recall of hand placement and sit>stand technique with trial. Pt transferred to [facility] positioned over toilet CGA. Pt performed 3/3 toileting tasks with min A this session with assistance required to bring pants to waist in standing.   Pt completed short distance functional mobility to bed using RW light min A for balance. EOB>supine CGA. Pt was left resting in bed with call bell in reach, bed alarm on, and all needs met.    Therapy Documentation Precautions:  Precautions Precautions: Fall Precaution Comments: Chronic vertigo; HOH Restrictions Weight Bearing Restrictions Per Provider Order: No   Therapy/Group: Individual Therapy  [name] 04/24/2023, 8:55 AM

## 2023-04-24 NOTE — Progress Notes (Signed)
 Slept good. Rash on back looks better, still complains of it itching. Continues to request to keep purewick. I informed him that it needs to come off soon. I know. Denies pain. Refused scheduled miralax , although reports only having small BM yesterday. ? Needs a RD consult? Any Mcneice A

## 2023-04-24 NOTE — Evaluation (Signed)
 Physical Therapy Assessment and Plan  Patient Details  Name: John [name] MRN: [id number] Date of Birth: Aug 04, 1945  PT Diagnosis: Abnormal posture, Abnormality of gait, Ataxia, Coordination disorder, Difficulty walking, and Impaired sensation Rehab Potential: Good ELOS: ~1.5 weeks   Today's Date: 04/24/2023 PT Individual Time: 0905-1004 PT Individual Time Calculation (min): 59 min    Hospital Problem: Principal Problem:   Right middle cerebral artery stroke [facility])   Past Medical History:  Past Medical History:  Diagnosis Date   BPH (benign prostatic hyperplasia)    CTS (carpal tunnel syndrome)    Depression    Diabetes mellitus    Essential hypertension    GERD (gastroesophageal reflux disease)    History of breast cancer    Hypercholesteremia    Neuropathy    Past Surgical History:  Past Surgical History:  Procedure Laterality Date   CATARACT EXTRACTION, BILATERAL  01/18/2017   LITHOTRIPSY     MASTECTOMY Right 05/02/2018   TONSILLECTOMY AND ADENOIDECTOMY      Assessment & Plan Clinical Impression: Patient is a 78 y.o. 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 Per chart review patient lives with spouse. Two-level home bed and bath upstairs 3 steps to entry. Independent prior to admission. Presented 04/16/2023 with recent follow-up with PCP for shortness of breath as well as dry heaving/dizziness with nausea and inability to tolerate p.o. Cranial CT scan showed no acute abnormalities. Patient did not receive tPA. 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 CTA head and neck with no large vessel occlusion or flow-limiting  proximal intracranial stenosis. 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 50% stenosis of the left vertebral artery origin due to soft and calcified plaque. Admission chemistries unremarkable except sodium 134 chloride 94 glucose 407 BUN 31 creatinine 1.34, elevated betahydroxybutyrate level at 1.85, which improved with IV fluid, hemoglobin A1c of 12.4 04/11/2023. Echocardiogram ejection fraction of 60 to 65% no wall motion abnormalities grade 1 diastolic dysfunction. Neurology follow-up placed on low-dose aspirin  81 mg daily and Plavix  75 mg daily for CVA prophylaxis x 3 weeks then Plavix  alone. Hospital course AKI on CKD improved with gentle IV fluids labs creatinine 1.22. Tolerating a regular consistency diet. Therapy evaluations completed due to patient decreased functional mobility was admitted for a comprehensive rehab program. Patient transferred to CIR on 04/22/2023 .   Patient currently requires mod with mobility secondary to   , decreased cardiorespiratoy endurance, impaired timing and sequencing, unbalanced muscle activation, and decreased coordination, decreased midline orientation, and decreased standing balance, decreased postural control, and decreased balance strategies.  Prior to hospitalization, patient was independent  with mobility and lived with Spouse (wife, Boby) in a House home.  Home access is 3Stairs to enter.  Patient will benefit from skilled PT intervention to maximize safe functional mobility, minimize fall risk, and decrease caregiver burden for planned discharge home with 24 hour supervision.  Anticipate patient will benefit from follow up OP at discharge.   PT - End of Session Activity Tolerance: Tolerates 30+ min activity with multiple rests Endurance Deficit: Yes Endurance Deficit Description: Very poor endurance requiring frequent seated rest break PT Assessment Rehab Potential (ACUTE/IP ONLY): Good PT Barriers to Discharge: Inaccessible home  environment;Home environment access/layout;Decreased caregiver support PT Patient demonstrates impairments in the following area(s): Balance;Safety;Sensory;Behavior;Endurance;Motor;Perception PT Transfers Functional Problem(s): Bed Mobility;Bed to Chair;Car;Furniture;Floor PT Locomotion Functional Problem(s): Stairs;Ambulation PT Plan PT Intensity: Minimum of 1-2 x/day ,45 to 90 minutes PT Frequency: 5 out of 7 days PT Duration Estimated Length of Stay: ~1.5 weeks PT Treatment/Interventions: Ambulation/gait training;Community reintegration;DME/adaptive equipment instruction;Neuromuscular re-education;Psychosocial support;Stair training;UE/LE Strength taining/ROM;Balance/vestibular training;Discharge planning;Functional electrical stimulation;Pain management;Skin care/wound management;Therapeutic Activities;UE/LE Coordination activities;Cognitive remediation/compensation;Disease management/prevention;Functional mobility training;Patient/family education;Splinting/orthotics;Therapeutic Exercise;Visual/perceptual remediation/compensation PT Transfers Anticipated Outcome(s): mod-I using LRAD PT Locomotion Anticipated Outcome(s): supervision using LRAD PT Recommendation Recommendations for Other Services: Therapeutic Recreation consult Therapeutic Recreation Interventions: Stress management;Outing/community reintergration Follow Up Recommendations: Outpatient PT;24 hour supervision/assistance Patient destination: Home Equipment Recommended: To be determined   PT Evaluation Precautions/Restrictions Precautions Precautions: Fall;Other (comment) Precaution Comments: Chronic vertigo; HOH Restrictions Weight Bearing Restrictions Per Provider Order: No Pain Pain Assessment Pain Scale: 0-10 Pain Score: 0-No pain Pain Interference Pain Interference Pain Effect on Sleep: 1. Rarely or not at all Pain Interference with Therapy Activities: 1. Rarely or not at all Pain Interference with Day-to-Day  Activities: 1. Rarely or not at all Home Living/Prior Functioning Home Living Available Help at Discharge: Family;Available 24 hours/day Type of Home: House Home Access: Stairs to enter [employer] of Steps: 3 Entrance Stairs-Rails: Right;Left;Can reach both Home Layout: Two level;Other (Comment);Able to live on main level with bedroom/bathroom Alternate Level Stairs-Number of Steps: 14 Alternate Level Stairs-Rails: Right;Left;Can reach both Additional Comments: became SOB with a lot of exertion (such as climbing to top of stairs); reports occasional but rare use of cane  Lives With: Spouse (wife, [name]) Prior Function Level of Independence: Independent with basic ADLs;Independent with homemaking with ambulation;Independent with gait;Independent with transfers  Able to Take Stairs?: Yes (reports he gets SOB at the top) Driving: Yes Vocation: Retired Vision/Perception  Vision - History Ability to See in Adequate Light: 1 Impaired Perception Perception: Within Functional Limits Praxis Praxis: WFL  Cognition Overall Cognitive Status: Within Functional Limits for tasks assessed Arousal/Alertness: Awake/alert Orientation Level: Oriented X4 (pt reports feeling off a little this morning) Year: 2025 Month: January Day of Week: Correct Memory: Appears intact Awareness: Appears intact Problem Solving: Appears intact Safety/Judgment: Appears intact Sensation Sensation Light Touch: Impaired Detail Peripheral sensation comments: Reports neuropathy in BUE<BLE since chemotherapy. Light Touch Impaired Details: Impaired RUE;Impaired LUE;Impaired RLE;Impaired LLE Hot/Cold: Not tested Proprioception: Appears Intact Stereognosis: Not tested Coordination Gross Motor Movements are Fluid and Coordinated: No Coordination and Movement Description: Deficits due to generalized deconditioning and impaired endurance along with impaired midline orientation and balance Motor   Motor Motor: Other (comment) Motor - Skilled Clinical Observations: generalized deconditioning and impaired midline orientation resulting in R posterior lean bias   Trunk/Postural Assessment  Cervical Assessment Cervical Assessment: Exceptions to [facility] (slight forward head) Thoracic Assessment Thoracic Assessment: Exceptions to [facility] (rounded shoulders) Lumbar Assessment Lumbar Assessment: Within Functional Limits Postural Control Postural Control: Deficits on evaluation Postural Limitations: decreased with R posterior lean bias  Balance Balance Balance Assessed: Yes Static Sitting Balance Static Sitting - Balance Support: Feet supported Static Sitting - Level of Assistance: 5: Stand by assistance Dynamic Sitting Balance Dynamic Sitting - Balance Support: Feet supported Dynamic Sitting - Level of Assistance: Other (comment) (CGA) Static Standing Balance Static Standing - Balance Support: During functional activity;Right upper extremity supported Static Standing - Level of Assistance: 4: Min assist Dynamic Standing Balance Dynamic Standing - Balance Support: During functional activity;Right upper extremity supported Dynamic Standing - Level of Assistance:  4: Min assist;3: Mod assist Extremity Assessment      RLE Assessment RLE Assessment: Exceptions to [facility] Active Range of Motion (AROM) Comments: WFL/WNL General Strength Comments: assessed in sitting RLE Strength Right Hip Flexion: 4+/5 Right Knee Flexion: 5/5 Right Knee Extension: 5/5 Right Ankle Dorsiflexion: 5/5 Right Ankle Plantar Flexion: 5/5 LLE Assessment LLE Assessment: Exceptions to [facility] Active Range of Motion (AROM) Comments: WFL/WNL General Strength Comments: assessed in sitting LLE Strength Left Hip Flexion: 4+/5 Left Knee Flexion: 5/5 Left Knee Extension: 5/5 Left Ankle Dorsiflexion: 5/5 Left Ankle Plantar Flexion: 5/5  Care Tool Care Tool Bed Mobility Roll left and right activity   Roll left and right  assist level: Supervision/Verbal cueing (using bedrails)    Sit to lying activity   Sit to lying assist level: Supervision/Verbal cueing (using bedrails, sleeps in recliner at home)    Lying to sitting on side of bed activity   Lying to sitting on side of bed assist level: the ability to move from lying on the back to sitting on the side of the bed with no back support.: Supervision/Verbal cueing (using bedrails, sleeps in recliner at home)     Care Tool Transfers Sit to stand transfer   Sit to stand assist level: Moderate Assistance - Patient 50 - 74%    Chair/bed transfer   Chair/bed transfer assist level: Moderate Assistance - Patient 50 - 74%    Car transfer Car transfer activity did not occur: Safety/medical concerns        Care Tool Locomotion Ambulation   Assist level: Moderate Assistance - Patient 50 - 74% Assistive device: Hand held assist Max distance: 33ft  Walk 10 feet activity   Assist level: Moderate Assistance - Patient - 50 - 74% Assistive device: Hand held assist   Walk 50 feet with 2 turns activity   Assist level: Moderate Assistance - Patient - 50 - 74% Assistive device: Hand held assist  Walk 150 feet activity Walk 150 feet activity did not occur: Safety/medical concerns      Walk 10 feet on uneven surfaces activity Walk 10 feet on uneven surfaces activity did not occur: Safety/medical concerns      Stairs   Assist level: Minimal Assistance - Patient > 75% Stairs assistive device: 2 hand rails Max number of stairs: 4  Walk up/down 1 step activity   Walk up/down 1 step (curb) assist level: Minimal Assistance - Patient > 75% Walk up/down 1 step or curb assistive device: 2 hand rails  Walk up/down 4 steps activity   Walk up/down 4 steps assist level: Minimal Assistance - Patient > 75% Walk up/down 4 steps assistive device: 2 hand rails  Walk up/down 12 steps activity Walk up/down 12 steps activity did not occur: Safety/medical concerns      Pick up  small objects from floor   Pick up small object from the floor assist level: Moderate Assistance - Patient 50 - 74% Pick up small object from the floor assistive device: HHA  Wheelchair Is the patient using a wheelchair?: Yes (for transport b/c of impaired activity tolerance limiting gait distance) Type of Wheelchair: Manual   Wheelchair assist level: Dependent - Patient 0%    Wheel 50 feet with 2 turns activity   Assist Level: Dependent - Patient 0%  Wheel 150 feet activity   Assist Level: Dependent - Patient 0%    Refer to Care Plan for Long Term Goals  SHORT TERM GOAL WEEK 1 PT Short Term Goal 1 (Week  1): Pt will perform sit<>stands using LRAD with CGA PT Short Term Goal 2 (Week 1): Pt will perform bed<>chair transfers using LRAD with CGA PT Short Term Goal 3 (Week 1): Pt will ambulate at least 174ft using LRAD with CGA PT Short Term Goal 4 (Week 1): Pt will navigate at least 8 steps using HRs with CGA  Recommendations for other services: Therapeutic Recreation  Stress management and Outing/community reintegration  Skilled Therapeutic Intervention Pt received supported upright in bed awake and agreeable to therapy session. Evaluation completed (see details above) with patient education regarding purpose of PT evaluation, PT POC and goals, therapy schedule, weekly team meetings, and other CIR information including safety plan and fall risk safety. Pt performed the below functional mobility tasks with the specified levels of skilled cuing and assistance. During sit<>stand transfers noticed pt with poor anterior weight shift with heavy reliance on backs of legs against seat for balance support. During gait training pt noticed to have rigid upper body posturing and holding head steady in midline to maintain balance with R posterior lean bias. At end of session, pt left seated in recliner with needs in reach and seat belt alarm on.  Mobility Bed Mobility Bed Mobility: Sit to Supine;Supine  to Sit (pt reports he sleeps in recliner at home) Supine to Sit: Contact Guard/Touching assist (relying on bedrails) Sit to Supine: Contact Guard/Touching assist (relying on bedrails) Transfers Transfers: Sit to Stand;Stand to Sit;Stand Pivot Transfers Sit to Stand: Minimal Assistance - Patient > 75%;Moderate Assistance - Patient 50-74% Stand to Sit: Minimal Assistance - Patient > 75%;Moderate Assistance - Patient 50-74% Stand Pivot Transfers: Minimal Assistance - Patient > 75%;Moderate Assistance - Patient 50 - 74% Stand Pivot Transfer Details: Tactile cues for initiation;Tactile cues for sequencing;Tactile cues for weight shifting;Verbal cues for precautions/safety;Verbal cues for technique;Verbal cues for sequencing;Verbal cues for gait pattern;Tactile cues for posture Transfer (Assistive device): 1 person hand held assist Locomotion  Gait Ambulation: Yes Gait Assistance: Minimal Assistance - Patient > 75%;Moderate Assistance - Patient 50-74% Gait Distance (Feet): 73 Feet (x2; impaired activity tolerance limiting distance) Assistive device: 1 person hand held assist (started with R UE support over shoulder progressed to R HHA) Gait Assistance Details: Verbal cues for precautions/safety;Verbal cues for technique;Verbal cues for sequencing;Tactile cues for sequencing;Tactile cues for weight shifting;Verbal cues for gait pattern;Tactile cues for initiation;Tactile cues for posture Gait Gait: Yes Gait Pattern: Impaired Gait Pattern: Step-through pattern;Wide base of support (minor R>L posterior lean, rigid trunk posturing, head held at midline due to feeling imbalance) Gait velocity: decreased Stairs / Additional Locomotion Stairs: Yes Stairs Assistance: Minimal Assistance - Patient > 75% Stair Management Technique: Two rails;Step to pattern;Forwards Number of Stairs: 4 Height of Stairs: 6 Wheelchair Mobility Wheelchair Mobility: No   Discharge Criteria: Patient will be discharged from  PT if patient refuses treatment 3 consecutive times without medical reason, if treatment goals not met, if there is a change in medical status, if patient makes no progress towards goals or if patient is discharged from hospital.  The above assessment, treatment plan, treatment alternatives and goals were discussed and mutually agreed upon: by patient  Connell CHRISTELLA Kiss , PT, DPT, NCS, CSRS 04/24/2023, 9:59 AM

## 2023-04-24 NOTE — Discharge Instructions (Addendum)
 Inpatient Rehab Discharge Instructions  John [name] [facility] Discharge date and time: No discharge date for patient encounter.   Activities/Precautions/ Functional Status: Activity: As tolerated Diet: Diabetic diet Wound Care: Routine skin checks Functional status:  ___ No restrictions     ___ Walk up steps independently ___ 24/7 supervision/assistance   ___ Walk up steps with assistance ___ Intermittent supervision/assistance  ___ Bathe/dress independently ___ Walk with walker     _x__ Bathe/dress with assistance ___ Walk Independently    ___ Shower independently ___ Walk with assistance    ___ Shower with assistance ___ No alcohol     ___ Return to work/school ________  Special Instructions: No driving smoking or alcohol    COMMUNITY REFERRALS UPON DISCHARGE:    Home Health:   PT   OT     RN                Agency: [facility] Phone: [phone]    Medical Equipment/Items Ordered: John [name]                                                 Agency/Supplier:[employer] [employer] GAVE WIFE MD PRESCRIPTIONS FOR LIFT CHAIR AND STAIR LIFT SHE WILL FOLLOW UP WITH BOTH   My questions have been answered and I understand these instructions. I will adhere to these goals and the provided educational materials after my discharge from the hospital.  Patient/Caregiver Signature _______________________________ Date __________  Clinician Signature _______________________________________ Date __________  Please bring this form and your medication list with you to all your follow-up doctor's appointments. STROKE/TIA DISCHARGE INSTRUCTIONS SMOKING Cigarette smoking nearly doubles your risk of having a stroke & is the single most alterable risk factor  If you smoke or have smoked in the last 12 months, you are advised to quit smoking for your health. Most of the excess cardiovascular risk related to smoking disappears within a year of stopping. Ask you doctor about anti-smoking  medications [place] Quit Line: 1-800-QUIT NOW Free Smoking Cessation Classes [phone]  CHOLESTEROL Know your levels; limit fat & cholesterol in your diet  Lipid Panel     Component Value Date/Time   CHOL 100 04/21/2023 0333   TRIG 120 04/21/2023 0333   HDL 22 (L) 04/21/2023 0333   CHOLHDL 4.5 04/21/2023 0333   VLDL 24 04/21/2023 0333   LDLCALC 54 04/21/2023 0333     Many patients benefit from treatment even if their cholesterol is at goal. Goal: Total Cholesterol (CHOL) less than 160 Goal:  Triglycerides (TRIG) less than 150 Goal:  HDL greater than 40 Goal:  LDL (LDLCALC) less than 100   BLOOD PRESSURE American Stroke Association blood pressure target is less that 120/80 mm/Hg  Your discharge blood pressure is:  BP: (!) 141/60 Monitor your blood pressure Limit your salt and alcohol intake Many individuals will require more than one medication for high blood pressure  DIABETES (A1c is a blood sugar average for last 3 months) Goal HGBA1c is under 7% (HBGA1c is blood sugar average for last 3 months)  Diabetes:    Lab Results  Component Value Date   HGBA1C 12.4 (H) 04/11/2023    Your HGBA1c can be lowered with medications, healthy diet, and exercise. Check your blood sugar as directed by your physician Call your physician if you experience unexplained or low blood sugars.  PHYSICAL  ACTIVITY/REHABILITATION Goal is 30 minutes at least 4 days per week  Activity: Increase activity slowly, Therapies: Physical Therapy: Home Health Return to work:  Activity decreases your risk of heart attack and stroke and makes your heart stronger.  It helps control your weight and blood pressure; helps you relax and can improve your mood. Participate in a regular exercise program. Talk with your doctor about the best form of exercise for you (dancing, walking, swimming, cycling).  DIET/WEIGHT Goal is to maintain a healthy weight  Your discharge diet is:  Diet Order             Diet Carb  Modified Fluid consistency: Thin; Room service appropriate? Yes  Diet effective now                   liquids Your height is:  Height: 6' (182.9 cm) Your current weight is: Weight: 101.9 kg Your Body Mass Index (BMI) is:  BMI (Calculated): 30.45 Following the type of diet specifically designed for you will help prevent another stroke. Your goal weight range is:   Your goal Body Mass Index (BMI) is 19-24. Healthy food habits can help reduce 3 risk factors for stroke:  High cholesterol, hypertension, and excess weight.  RESOURCES Stroke/Support Group:  Call [phone]   STROKE EDUCATION PROVIDED/REVIEWED AND GIVEN TO PATIENT Stroke warning signs and symptoms How to activate emergency medical system (call 911). Medications prescribed at discharge. Need for follow-up after discharge. Personal risk factors for stroke. Pneumonia vaccine given: No Flu vaccine given: No My questions have been answered, the writing is legible, and I understand these instructions.  I will adhere to these goals & educational materials that have been provided to me after my discharge from the hospital.

## 2023-04-24 NOTE — Plan of Care (Signed)
  Problem: RH Balance Goal: LTG Patient will maintain dynamic sitting balance (PT) Description: LTG:  Patient will maintain dynamic sitting balance with assistance during mobility activities (PT) Flowsheets (Taken 04/24/2023 1244) LTG: Pt will maintain dynamic sitting balance during mobility activities with:: Independent with assistive device  Goal: LTG Patient will maintain dynamic standing balance (PT) Description: LTG:  Patient will maintain dynamic standing balance with assistance during mobility activities (PT) Flowsheets (Taken 04/24/2023 1244) LTG: Pt will maintain dynamic standing balance during mobility activities with:: Supervision/Verbal cueing   Problem: Sit to Stand Goal: LTG:  Patient will perform sit to stand with assistance level (PT) Description: LTG:  Patient will perform sit to stand with assistance level (PT) Flowsheets (Taken 04/24/2023 1244) LTG: PT will perform sit to stand in preparation for functional mobility with assistance level: Independent with assistive device   Problem: RH Bed Mobility Goal: LTG Patient will perform bed mobility with assist (PT) Description: LTG: Patient will perform bed mobility with assistance, with/without cues (PT). Flowsheets (Taken 04/24/2023 1244) LTG: Pt will perform bed mobility with assistance level of: Independent with assistive device    Problem: RH Bed to Chair Transfers Goal: LTG Patient will perform bed/chair transfers w/assist (PT) Description: LTG: Patient will perform bed to chair transfers with assistance (PT). Flowsheets (Taken 04/24/2023 1244) LTG: Pt will perform Bed to Chair Transfers with assistance level: Independent with assistive device    Problem: RH Car Transfers Goal: LTG Patient will perform car transfers with assist (PT) Description: LTG: Patient will perform car transfers with assistance (PT). Flowsheets (Taken 04/24/2023 1244) LTG: Pt will perform car transfers with assist:: Supervision/Verbal cueing    Problem: RH Ambulation Goal: LTG Patient will ambulate in controlled environment (PT) Description: LTG: Patient will ambulate in a controlled environment, # of feet with assistance (PT). Flowsheets (Taken 04/24/2023 1244) LTG: Pt will ambulate in controlled environ  assist needed:: Supervision/Verbal cueing LTG: Ambulation distance in controlled environment: 14ft using LRAD Goal: LTG Patient will ambulate in home environment (PT) Description: LTG: Patient will ambulate in home environment, # of feet with assistance (PT). Flowsheets (Taken 04/24/2023 1244) LTG: Pt will ambulate in home environ  assist needed:: Supervision/Verbal cueing LTG: Ambulation distance in home environment: 55ft using LRAD   Problem: RH Stairs Goal: LTG Patient will ambulate up and down stairs w/assist (PT) Description: LTG: Patient will ambulate up and down # of stairs with assistance (PT) Flowsheets (Taken 04/24/2023 1244) LTG: Pt will ambulate up/down stairs assist needed:: Supervision/Verbal cueing LTG: Pt will  ambulate up and down number of stairs: 12 steps using HRs per home set-up

## 2023-04-24 NOTE — Plan of Care (Signed)
  Problem: Consults Goal: RH STROKE PATIENT EDUCATION Description: See Patient Education module for education specifics  Outcome: Progressing   Problem: RH BOWEL ELIMINATION Goal: RH STG MANAGE BOWEL WITH ASSISTANCE Description: STG Manage Bowel with toileting Assistance. Outcome: Progressing Goal: RH STG MANAGE BOWEL W/MEDICATION W/ASSISTANCE Description: STG Manage Bowel with Medication with mod I Assistance. Outcome: Progressing   Problem: RH BLADDER ELIMINATION Goal: RH STG MANAGE BLADDER WITH ASSISTANCE Description: STG Manage Bladder With toileting Assistance Outcome: Progressing Goal: RH STG MANAGE BLADDER WITH MEDICATION WITH ASSISTANCE Description: STG Manage Bladder With Medication With mod I Assistance. Outcome: Progressing   Problem: RH SAFETY Goal: RH STG ADHERE TO SAFETY PRECAUTIONS W/ASSISTANCE/DEVICE Description: STG Adhere to Safety Precautions With cues Assistance/Device. Outcome: Progressing   Problem: RH PAIN MANAGEMENT Goal: RH STG PAIN MANAGED AT OR BELOW PT'S PAIN GOAL Description: < 4 with prns Outcome: Progressing   Problem: RH KNOWLEDGE DEFICIT Goal: RH STG INCREASE KNOWLEDGE OF DIABETES Description: Patient and family will be able to manage DM using educational resources for medications and diet modification independently Outcome: Progressing Goal: RH STG INCREASE KNOWLEDGE OF HYPERTENSION Description: Patient and family will be able to manage HTN using educational resources for medications and diet modification independently Outcome: Progressing Goal: RH STG INCREASE KNOWLEGDE OF HYPERLIPIDEMIA Description: Patient and family will be able to manage HLD using educational resources for medications and diet modification independently Outcome: Progressing Goal: RH STG INCREASE KNOWLEDGE OF STROKE PROPHYLAXIS Description: Patient and family will be able to manage secondary risks using educational resources for medications and diet modification  independently Outcome: Progressing

## 2023-04-24 NOTE — Plan of Care (Signed)
  Problem: RH Balance Goal: LTG Patient will maintain dynamic standing with ADLs (OT) Description: LTG:  Patient will maintain dynamic standing balance with assist during activities of daily living (OT)  Flowsheets (Taken 04/24/2023 1249) LTG: Pt will maintain dynamic standing balance during ADLs with: Independent with assistive device   Problem: RH Bathing Goal: LTG Patient will bathe all body parts with assist levels (OT) Description: LTG: Patient will bathe all body parts with assist levels (OT) Flowsheets (Taken 04/24/2023 1249) LTG: Pt will perform bathing with assistance level/cueing: Independent with assistive device    Problem: RH Dressing Goal: LTG Patient will perform upper body dressing (OT) Description: LTG Patient will perform upper body dressing with assist, with/without cues (OT). Flowsheets (Taken 04/24/2023 1249) LTG: Pt will perform upper body dressing with assistance level of: Independent with assistive device Goal: LTG Patient will perform lower body dressing w/assist (OT) Description: LTG: Patient will perform lower body dressing with assist, with/without cues in positioning using equipment (OT) Flowsheets (Taken 04/24/2023 1249) LTG: Pt will perform lower body dressing with assistance level of: Independent with assistive device   Problem: RH Toileting Goal: LTG Patient will perform toileting task (3/3 steps) with assistance level (OT) Description: LTG: Patient will perform toileting task (3/3 steps) with assistance level (OT)  Flowsheets (Taken 04/24/2023 1249) LTG: Pt will perform toileting task (3/3 steps) with assistance level: Independent with assistive device   Problem: RH Simple Meal Prep Goal: LTG Patient will perform simple meal prep w/assist (OT) Description: LTG: Patient will perform simple meal prep with assistance, with/without cues (OT). Flowsheets (Taken 04/24/2023 1249) LTG: Pt will perform simple meal prep with assistance level of: Independent with  assistive device   Problem: RH Toilet Transfers Goal: LTG Patient will perform toilet transfers w/assist (OT) Description: LTG: Patient will perform toilet transfers with assist, with/without cues using equipment (OT) Flowsheets (Taken 04/24/2023 1249) LTG: Pt will perform toilet transfers with assistance level of: Independent with assistive device   Problem: RH Tub/Shower Transfers Goal: LTG Patient will perform tub/shower transfers w/assist (OT) Description: LTG: Patient will perform tub/shower transfers with assist, with/without cues using equipment (OT) Flowsheets (Taken 04/24/2023 1249) LTG: Pt will perform tub/shower stall transfers with assistance level of: Independent with assistive device

## 2023-04-24 NOTE — Progress Notes (Signed)
 PROGRESS NOTE   Subjective/Complaints:  Pt reports he's doing ok, slept poorly but thinks it was just an off night. Will ask for meds if he feels he needs them, nothing for now.  Denies pain.  LBM yesterday or the day before, he's not sure-- no documentation of BM.  Urinating fine.  Denies any other complaints or concerns today.    ROS: as per HPI. Denies CP, SOB, abd pain, N/V/D/C, or any other complaints at this time.      Objective:   No results found. No results for input(s): WBC, HGB, HCT, PLT in the last 72 hours. Recent Labs    04/22/23 0905  NA 137  K 4.4  CL 102  CO2 22  GLUCOSE 223*  BUN 10  CREATININE 0.95  CALCIUM  9.2    Intake/Output Summary (Last 24 hours) at 04/24/2023 1159 Last data filed at 04/24/2023 0736 Gross per 24 hour  Intake 840 ml  Output 2000 ml  Net -1160 ml     Pressure Injury 04/22/23 Buttocks Right;Left Stage 1 -  Intact skin with non-blanchable redness of a localized area usually over a bony prominence. Pink area on bilateral buttocks (Active)  04/22/23 1530  Location: Buttocks  Location Orientation: Right;Left  Staging: Stage 1 -  Intact skin with non-blanchable redness of a localized area usually over a bony prominence.  Wound Description (Comments): Pink area on bilateral buttocks  Present on Admission: Yes    Physical Exam: Vital Signs Blood pressure 127/74, pulse 84, temperature 98.1 F (36.7 C), temperature source Oral, resp. rate 18, height 6' (1.829 m), weight 101.9 kg, SpO2 98%.    Physical Exam Constitutional: NAD, laying in bed, answered phone without difficulty during exam HENT: NCAT,  Extremely mild right ptosis. +HOH R>L Cardiovascular: RRR, no murmurs/rub/gallops.  Respiratory: CTAB.  Nonlabored breathing on room air Abdomen: + bowel sounds, a little hypoactive. No distention but protuberant, no tenderness.  Skin: C/D/I. No apparent lesions.   MSK:      No joint swelling noted Mood: Pleasant affect, appropriate mood.  PRIOR EXAMS: Neurologic exam: Alert and oriented x 3,  no dysarthria, cranial nerves II through XII grossly intact Moving all 4 extremities to gravity and resistance Sensation intact to light touch in all 4 extremities   Assessment/Plan: 1. Functional deficits which require 3+ hours per day of interdisciplinary therapy in a comprehensive inpatient rehab setting. Physiatrist is providing close team supervision and 24 hour management of active medical problems listed below. Physiatrist and rehab team continue to assess barriers to discharge/monitor patient progress toward functional and medical goals  Care Tool:  Bathing    Body parts bathed by patient: Right arm, Left arm, Chest, Abdomen, Front perineal area, Right upper leg, Left upper leg   Body parts bathed by helper: Right lower leg, Left lower leg, Buttocks     Bathing assist Assist Level: Minimal Assistance - Patient > 75%     Upper Body Dressing/Undressing Upper body dressing   What is the patient wearing?: Hospital gown only    Upper body assist Assist Level: Set up assist    Lower Body Dressing/Undressing Lower body dressing  What is the patient wearing?: Hospital gown only     Lower body assist Assist for lower body dressing: Set up assist     Toileting Toileting Toileting Activity did not occur (Clothing management and hygiene only): N/A (no void or bm)  Toileting assist       Transfers Chair/bed transfer  Transfers assist     Chair/bed transfer assist level: Moderate Assistance - Patient 50 - 74%     Locomotion Ambulation   Ambulation assist      Assist level: Moderate Assistance - Patient 50 - 74% Assistive device: Hand held assist Max distance: 61ft   Walk 10 feet activity   Assist     Assist level: Moderate Assistance - Patient - 50 - 74% Assistive device: Hand held assist   Walk 50 feet  activity   Assist    Assist level: Moderate Assistance - Patient - 50 - 74% Assistive device: Hand held assist    Walk 150 feet activity   Assist Walk 150 feet activity did not occur: Safety/medical concerns         Walk 10 feet on uneven surface  activity   Assist Walk 10 feet on uneven surfaces activity did not occur: Safety/medical concerns         Wheelchair     Assist Is the patient using a wheelchair?: Yes (for transport b/c of impaired activity tolerance limiting gait distance) Type of Wheelchair: Manual    Wheelchair assist level: Dependent - Patient 0%      Wheelchair 50 feet with 2 turns activity    Assist        Assist Level: Dependent - Patient 0%   Wheelchair 150 feet activity     Assist      Assist Level: Dependent - Patient 0%   Blood pressure 127/74, pulse 84, temperature 98.1 F (36.7 C), temperature source Oral, resp. rate 18, height 6' (1.829 m), weight 101.9 kg, SpO2 98%.  Medical Problem List and Plan: 1. 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  Antithrombotics: -DVT/anticoagulation:  Mechanical: Antiembolism stockings, thigh (TED hose) Bilateral lower extremities -antiplatelet therapy: Aspirin  81 mg daily and Plavix  75 mg daily x 3 weeks then Plavix  alone 3. Pain Management: Tylenol  as needed 4. Mood/Behavior/Sleep: Not emotional support             -antipsychotic agents: N/A 5. Neuropsych/cognition: This patient is capable of making decisions on his own behalf. 6. Skin/Wound Care: Routine skin checks 7. Fluids/Electrolytes/Nutrition: Routine in and outs with follow-up chemistries 8.  Hypertension.  Norvasc  5 mg daily.  Monitor with increased mobility  -1/11 intermittently elevated continue to monitor -04/24/23 BPs fine, cont  regimen; of note, pharmacy stated he was on furosemide  80mg  daily but down to 40mg  daily on inpatient, but not resumed here   Vitals:   04/22/23 1539 04/22/23 1953 04/23/23 0430 04/23/23 0841  BP: 135/71 125/63 (!) 151/72 132/75   04/23/23 1409 04/23/23 1932 04/24/23 0644 04/24/23 0731  BP: 133/76 137/69 111/61 127/74      9.  Diabetes mellitus peripheral neuropathy.  Latest hemoglobin A1c 12.3.  Currently on NovoLog  19 units twice daily.  Diabetic teaching.  Patient's home regimen of Glucophage  1000 mg twice daily as well as Toujeo  15 units in the morning and 70 units in the evening-- currently held.  Resume as needed 1/11 CBGs mildly elevated, restart metformin  XR at 500 mg dose, continue current dose of 70/30 for now -04/24/23 CBGs in the 200s, recent med adjustment, monitor for trend with that change, but suspect he'll likely need further adjustments CBG (last 3)  Recent Labs    04/23/23 2019 04/24/23 0626 04/24/23 1126  GLUCAP 243* 229* 280*    10.  Chronic vertigo.  Continue Antivert  12.5 mg 3 times daily prn.  Patient followed outpatient by ENT   11.  BPH.  Proscar  5 mg daily, Flomax  0.4 mg daily.  Check PVR.  Followed by urology Dr. Evalene Nam [facility]  -1/11 continent continue current regimen  -04/24/23 no PVRs documented but continent, cont regimen   12.  History of breast cancer.  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  Status post modified radical mastectomy and chemotherapy.  Patient on tamoxifen  20mg  daily since April 2020 follow-up with outpatient oncology Dr Leverette Vallathucherry and resume tamoxifen  as outpatient.   13.  GERD.  Protonix  40mg  daily   14.  AKI on CKD.  Creatinine baseline 1.29-1.64.  Follow-up chemistries   15.  Obesity.  BMI 30.45.  Dietary follow-up  16. Constipation: on miralax  BID -04/24/23 reports LBM was yesterday or two days ago, no documentation of BMs on file-- also refusing miralax ; if no BM by tomorrow, would consider encouraging pt to  use miralax , or using alternative bowel regimen that he'd be willing to use.       LOS: 2 days A FACE TO FACE EVALUATION WAS PERFORMED  [address] 04/24/2023, 11:59 AM

## 2023-04-24 NOTE — Discharge Summary (Signed)
Physician Discharge Summary  Patient ID: John [name] MRN: [id number] DOB/AGE: Jul 29, 1945 78 y.o.  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 EXAM: CHEST - 2 VIEW COMPARISON:  12/10/2019. FINDINGS: The heart size and mediastinal contours are within normal limits. No consolidation, effusion, or pneumothorax. Degenerative changes are present in the thoracic spine. No acute osseous abnormality is seen. IMPRESSION: No active cardiopulmonary disease. Electronically Signed   By: Thornell Sartorius M.D.   On: 04/28/2023 20:14   ECHOCARDIOGRAM COMPLETE Result Date: 04/21/2023    ECHOCARDIOGRAM REPORT   Patient Name:   John [name] Date of Exam: 04/21/2023 Medical Rec #:  [id number]        Height:       73.0 in Accession #:    [id number]       Weight:       230.8 lb Date of Birth:  [birth year]        BSA:          2.287 m Patient Age:    77 years         BP:           138/61 mmHg Patient Gender: M                HR:           89 bpm. Exam Location:  Inpatient Procedure: 2D Echo, Cardiac Doppler, Color Doppler and Intracardiac            Opacification Agent Indications:    Stroke  History:        Patient has prior history of Echocardiogram examinations, most                 recent 11/14/2020. Signs/Symptoms:Edema and Fatigue; Risk                 Factors:Hypertension, Diabetes and Dyslipidemia.  Sonographer:    Vern Claude Referring Phys: [id number] SRISHTI L  BHAGAT  Sonographer Comments: Image acquisition challenging due to patient body habitus. IMPRESSIONS  1. Left ventricular ejection fraction, by estimation, is 60 to 65%. Left ventricular ejection fraction by 2D MOD biplane is 61.9 %. The left ventricle has normal function. The left ventricle has no regional wall motion abnormalities. Left ventricular diastolic parameters are consistent with Grade I diastolic dysfunction (impaired relaxation).  2. Right ventricular systolic function is normal. The right ventricular size is normal. There is normal pulmonary artery systolic pressure. The estimated right ventricular systolic pressure is 25.7 mmHg.  3. The mitral valve is grossly normal. No evidence of mitral valve regurgitation.  4. The aortic valve is tricuspid. Aortic valve regurgitation is not visualized. Aortic valve sclerosis/calcification is present, without any evidence of aortic stenosis. Comparison(s): No significant change from prior study. 11/14/2020: LVEF 60-65%. FINDINGS  Left Ventricle: Left ventricular ejection fraction, by estimation, is 60 to 65%. Left ventricular ejection fraction by 2D MOD biplane is 61.9 %. The left ventricle has normal function. The left ventricle has no regional wall motion abnormalities. The left  ventricular internal cavity size was normal in size. There is no left ventricular hypertrophy. Left ventricular diastolic parameters are consistent with Grade I diastolic dysfunction (impaired relaxation). Indeterminate filling pressures. Right Ventricle: The right ventricular size is normal. No increase in right ventricular wall thickness. Right ventricular systolic function is normal. There is normal pulmonary artery systolic pressure. The tricuspid regurgitant velocity is 2.38 m/s, and  with an assumed right atrial pressure of 3 mmHg, the estimated right ventricular systolic pressure is 25.7 mmHg. Left Atrium: Left atrial size was normal in size. Right Atrium: Right atrial size was  normal in size. Pericardium: There is no evidence of pericardial effusion. Mitral Valve: The mitral valve is grossly normal. No evidence of mitral valve regurgitation. MV peak gradient, 5.9 mmHg. The mean mitral valve gradient is 2.0 mmHg. Tricuspid Valve: The tricuspid valve is grossly normal. Tricuspid valve regurgitation is trivial. Aortic Valve: The aortic valve is tricuspid. Aortic valve regurgitation is not visualized. Aortic valve sclerosis/calcification is present, without any evidence of aortic stenosis. Aortic valve mean gradient measures 3.0 mmHg. Aortic valve peak gradient measures 4.4 mmHg. Aortic valve area, by VTI measures 2.96 cm. Pulmonic Valve: The pulmonic valve was grossly normal. Pulmonic valve regurgitation is trivial. Aorta: The aortic root and ascending aorta are structurally normal, with no evidence of dilitation. IAS/Shunts: No atrial level shunt detected by color flow Doppler.  LEFT VENTRICLE PLAX 2D                        Biplane EF (MOD) LVIDd:         4.50 cm         LV Biplane EF:   Left LVIDs:         2.90 cm                          ventricular LV PW:         0.70 cm                          ejection LV IVS:        0.90 cm                          fraction by LVOT diam:     2.00 cm                          2D MOD LV SV:         63                               biplane is LV SV Index:   27                               61.9 %. LVOT Area:     3.14 cm                                Diastology                                LV e' medial:    5.59 cm/s  LV Volumes (MOD)               LV E/e' medial:  12.1 LV vol d, MOD    88.1 ml       LV e' lateral:   8.70 cm/s A2C:                           LV E/e' lateral: 7.8 LV vol d, MOD    88.4 ml A4C: LV vol s, MOD    30.1 ml A2C: LV vol s, MOD    36.7 ml A4C: LV SV MOD A2C:   58.0 ml LV SV MOD A4C:   88.4 ml LV SV MOD BP:    55.5 ml RIGHT VENTRICLE RV Basal diam:  3.70 cm RV Mid diam:    2.20 cm RV S prime:     13.40 cm/s TAPSE (M-mode): 3.2 cm  LEFT ATRIUM             Index        RIGHT ATRIUM           Index LA diam:        3.60 cm 1.57 cm/m   RA Area:     12.30 cm LA Vol (A2C):   36.9 ml 16.14 ml/m  RA Volume:   25.20 ml  11.02 ml/m LA Vol (A4C):   42.5 ml 18.59 ml/m LA Biplane Vol: 43.7 ml 19.11 ml/m  AORTIC VALVE                    PULMONIC VALVE AV Area (Vmax):    2.85 cm     PV Vmax:          0.95 m/s AV Area (Vmean):   2.59 cm     PV Peak grad:     3.6 mmHg AV Area (VTI):     2.96 cm     PR End Diast Vel: 1.08 msec AV Vmax:           105.00 cm/s AV Vmean:          77.400 cm/s AV VTI:            0.212 m AV Peak Grad:      4.4 mmHg AV Mean Grad:      3.0 mmHg LVOT Vmax:         95.20 cm/s LVOT Vmean:        63.700 cm/s LVOT VTI:          0.200 m LVOT/AV VTI ratio: 0.94  AORTA Ao Root diam: 3.30 cm Ao Asc diam:  3.00 cm MITRAL VALVE                TRICUSPID VALVE MV Area (PHT): 3.51 cm     TR Peak grad:   22.7 mmHg MV Area VTI:   3.06 cm     TR Vmax:        238.00 cm/s MV Peak grad:  5.9 mmHg MV Mean grad:  2.0 mmHg     SHUNTS MV Vmax:       1.21 m/s     Systemic VTI:  0.20 m MV Vmean:      65.0 cm/s    Systemic Diam: 2.00 cm MV Decel Time: 216 msec MV E velocity: 67.60 cm/s MV A velocity: 103.00 cm/s MV E/A ratio:  0.66 Zoila Shutter MD Electronically signed by Zoila Shutter MD Signature Date/Time: 04/21/2023/11:05:34 AM  Final    CT ANGIO HEAD NECK W WO CM Result Date: 04/20/2023 CLINICAL DATA:  Stroke/TIA.  Determine embolic source. EXAM: CT ANGIOGRAPHY HEAD AND NECK WITH AND WITHOUT CONTRAST TECHNIQUE: Multidetector CT imaging of the head and neck was performed using the standard protocol during bolus administration of intravenous contrast. Multiplanar CT image reconstructions and MIPs were obtained to evaluate the vascular anatomy. Carotid stenosis measurements (when applicable) are obtained utilizing NASCET criteria, using the distal internal carotid diameter as the denominator. RADIATION DOSE REDUCTION: This exam was performed  according to the departmental dose-optimization program which includes automated exposure control, adjustment of the mA and/or kV according to patient size and/or use of iterative reconstruction technique. CONTRAST:  75mL OMNIPAQUE IOHEXOL 350 MG/ML SOLN COMPARISON:  MRI yesterday. FINDINGS: CT HEAD FINDINGS Brain: Chronic small-vessel ischemic changes affect the pons. Small acute infarction of the middle cerebellar peduncle on the right cannot be specifically seen by CT. Cerebral hemispheres show chronic small-vessel ischemic changes of the white matter without evidence of an acute stroke. No mass, hemorrhage, hydrocephalus or extra-axial collection. Vascular: There is atherosclerotic calcification of the major vessels at the base of the brain. Skull: Negative Sinuses/Orbits: Clear/normal Other: None Review of the MIP images confirms the above findings CTA NECK FINDINGS Aortic arch: Aortic atherosclerosis.  Branching pattern is normal. Right carotid system: Common carotid artery widely patent to the bifurcation. Minimal atherosclerotic plaque at the bifurcation and ICA bulb but no stenosis. Cervical ICA widely patent. Left carotid system: Common carotid artery widely patent to the bifurcation. Calcified plaque at the bifurcation and proximal ICA bulb. Minimal diameter of the proximal ICA is 1.75 mm. Compared to a more distal cervical ICA diameter of 4.2 mm, this indicates a 60% stenosis. Vertebral arteries: No proximal subclavian stenosis. Right vertebral artery origin is widely patent. The vessel appears normal through the cervical region to the foramen magnum. There is soft and calcified plaque at the left vertebral artery origin with a 50% stenosis. Beyond that, the vessel is patent through the cervical region to the foramen magnum. Skeleton: Minimal spondylosis. Other neck: No mass or lymphadenopathy. Upper chest: Chronic scarring in the anterior right upper lobe. Review of the MIP images confirms the above  findings CTA HEAD FINDINGS Anterior circulation: Both internal carotid arteries are patent through the skull base and siphon regions. There is siphon atherosclerotic calcification with maximal stenosis estimated at 30% on both sides. The anterior and middle cerebral vessels are patent. No large vessel occlusion or flow limiting proximal stenosis. No aneurysm or vascular malformation. Posterior circulation: Both vertebral arteries are widely patent to the basilar artery. No basilar stenosis. Posterior circulation branch vessels are patent. There is moderate long segment stenosis of the proximal right PCA, but the vessel does show persistent flow. Venous sinuses: Patent and normal. Anatomic variants: None significant. Review of the MIP images confirms the above findings IMPRESSION: 1. No large vessel occlusion or flow limiting proximal intracranial stenosis. 2. 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 3. 50% stenosis of the left vertebral artery origin due to soft and calcified plaque. 4. 30% stenosis of both internal carotid artery siphon regions due to calcified plaque. 5. Moderate long segment stenosis of the proximal right PCA, but the vessel does show persistent flow. 6. Aortic atherosclerosis. 7. Chronic small-vessel ischemic changes of the pons and cerebral hemispheric white matter. Small acute infarction of the middle cerebellar peduncle on the right shown by MRI cannot be specifically seen by CT.  Aortic Atherosclerosis (ICD10-I70.0). Electronically Signed   By: Paulina Fusi M.D.   On: 04/20/2023 11:22   MR BRAIN W WO CONTRAST Result Date: 04/19/2023 CLINICAL DATA:  Vertigo EXAM: MRI HEAD WITHOUT AND WITH CONTRAST TECHNIQUE: Multiplanar, multiecho pulse sequences of the brain and surrounding structures were obtained without and with intravenous contrast. CONTRAST:  10mL GADAVIST GADOBUTROL 1 MMOL/ML IV SOLN COMPARISON:  11/08/2022 MRI head, 04/16/2023 CT head FINDINGS: Brain: Small area  of restricted diffusion with ADC correlate in the right middle cerebellar peduncle (series 2, image 13 and 14; series 3, image 14). This is associated with a small amount of increased T2 hyperintense signal. No acute hemorrhage, mass, mass effect, or midline shift. No hydrocephalus or extra-axial collection. Pituitary and craniocervical junction within normal limits. Focus of hemosiderin deposition in the pons, likely sequela prior hypertensive microhemorrhage. Remote lacunar infarcts in the right lentiform nucleus, bilateral thalami, and right pons. Advanced cerebral atrophy for age. Confluent T2 hyperintense signal in the periventricular white matter, likely the sequela of moderate chronic small vessel ischemic disease. Dilated perivascular spaces in the basal ganglia and cortex. Vascular: Normal arterial flow voids. Skull and upper cervical spine: Normal marrow signal. Sinuses/Orbits: Clear paranasal sinuses. No acute finding in the orbits. Status post bilateral lens replacements. Other: Fluid in the right mastoid air cells. IMPRESSION: Small acute infarct in the right middle cerebellar peduncle. Electronically Signed   By: Wiliam Ke M.D.   On: 04/19/2023 19:40   CT Head Wo Contrast Result Date: 04/16/2023 CLINICAL DATA:  Headache, increasing frequency or severity. Dizziness and vomiting. EXAM: CT HEAD WITHOUT CONTRAST TECHNIQUE: Contiguous axial images were obtained from the base of the skull through the vertex without intravenous contrast. RADIATION DOSE REDUCTION: This exam was performed according to the departmental dose-optimization program which includes automated exposure control, adjustment of the mA and/or kV according to patient size and/or use of iterative reconstruction technique. COMPARISON:  Head MRI 11/08/2022 FINDINGS: Brain: There is no evidence of an acute infarct, intracranial hemorrhage, mass, midline shift, or extra-axial fluid collection. Patchy hypodensities in the cerebral white  matter are nonspecific but compatible with moderate chronic small vessel ischemic disease. 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 Mild cerebral atrophy is within normal limits for age. Vascular: Calcified atherosclerosis at the skull base. No hyperdense vessel. Skull: No acute fracture or suspicious osseous lesion. Sinuses/Orbits: Paranasal sinuses and mastoid air cells are clear. Bilateral cataract extraction. Other: None. IMPRESSION: 1. No evidence of acute intracranial abnormality. 2. Moderate chronic small vessel ischemic disease. Electronically Signed   By: Sebastian Ache M.D.   On: 04/16/2023 16:45    Labs:  Basic Metabolic Panel: Recent Labs  Lab 04/30/23 0552 05/02/23 0456  NA 132* 132*  K 4.3 4.7  CL 99 101  CO2 20* 19*  GLUCOSE 176* 115*  BUN 11 13  CREATININE 1.11 1.00  CALCIUM 9.1 9.0    CBC: Recent Labs  Lab 04/30/23 0552 05/02/23 0456  WBC 8.6 7.0  HGB 12.6* 12.3*  HCT 38.0* 37.9*  MCV 82.4 83.3  PLT 265 296    CBG: Recent Labs  Lab 05/04/23 2141 05/05/23 0640 05/05/23 1128 05/05/23 1644 05/05/23 2132  GLUCAP 120* 130* 151* 71 142*   Family history.  Mother with lymphoma.  Father with diabetes as well as CVA.  Sister with ovarian cancer.  Denies any colon cancer esophageal cancer or rectal cancer  Brief HPI:   John [name] is a 78  y.o. 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  Per chart review lives with spouse to level home bed and bath upstairs 3 steps to entry.  Independent prior to admission.  Presented 04/16/2023 with recent follow-up with PCP for shortness of breath as well as dry heaving, dizziness with nausea and  inability to tolerate p.o. intake.  Cranial CT scan showed no acute abnormalities.  Patient did not receive tPA.  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  CTA head and neck with no large vessel occlusion or flow-limiting proximal intracranial stenosis.  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  50% stenosis of the left vertebral artery origin due to soft calcified plaque.  Admission chemistries unremarkable except sodium 134 chloride 94 glucose 407 BUN 31 creatinine 1.34, hemoglobin A1c 12.4 04/11/2023.  Echocardiogram ejection fraction of 60 to 65% no wall motion abnormalities grade 1 diastolic dysfunction.  Neurology follow-up placed on low-dose aspirin 81 mg daily and Plavix 75 mg daily for CVA prophylaxis x 3 weeks then Plavix alone.  Hospital course AKI on CKD improved with gentle IV fluids latest creatinine 1.22.  Tolerating a regular consistency diet.  Therapy evaluations completed due to patient decreased functional mobility was admitted for a comprehensive rehab program.   Hospital Course: John [name] was admitted to rehab 04/22/2023 for inpatient therapies to consist of PT, ST and OT at least three hours five days a week. Past admission physiatrist, therapy team and rehab RN have worked together to provide customized collaborative inpatient rehab.  Pertaining to patient's right middle cerebellar peduncle CVA remained stable maintained on low-dose aspirin and Plavix x 3 weeks then Plavix alone.  Blood pressure with some variables and monitored with Norvasc.  Patient will need outpatient follow-up..  Diabetes mellitus peripheral neuropathy latest hemoglobin A1c 12.3 patient had been on NovoLog during hospital stay with diabetic teaching.  Home regimen of Glucophage as well as Toujeo which could be resumed as needed.  Chronic vertigo maintained on Antivert follow-up outpatient ENT.  History of BPH maintained on Proscar as well as Flomax followed by  urology service Dr. Tobie Lords at [facility].  History of breast cancer T2 N10 invasive carcinoma 04/2018 status post radical mastectomy chemotherapy maintained on novel Dex since April 2020 follow-up outpatient oncology services Dr.Vallathucherry.  Protonix ongoing for GERD.  AKI on CKD creatinine baseline 1.24-1.64 with follow-up chemistries.  Noted obesity BMI 30.45 dietary follow-up.Developed cough with CXR negative and COVID negative.findings of Metapneumovrius with conservative care and droplet precautions.   Blood pressures were monitored on TID basis and remained controlled  Diabetes has been monitored with ac/hs CBG checks and SSI was use prn for tighter BS control.    Rehab course: During patient's stay in rehab weekly team conferences were held to monitor patient's progress, set goals and discuss barriers to discharge. At admission, patient required required moderate assist 70 feet rolling walker minimal assist step pivot transfers  Physical exam.  Blood pressure 163/73 pulse 78 temperature 98.4 respirations 21 oxygen saturation is 95% room air Constitutional.  No acute distress HEENT Head.  Normocephalic and atraumatic Eyes.  Pupils round and reactive to light no discharge without nystagmus Neck.  Supple nontender no JVD  without thyromegaly Cardiac regular rate and rhythm without any extra sounds or murmur heard Abdomen.  Soft nontender positive bowel sounds without rebound Respiratory effort normal no respiratory distress without wheeze Musculoskeletal Strength.  Right upper extremity 5/5 Left upper extremity 5/5 Right lower extremity 5/5 HF 5/5 KE 5/5 DF 5/5 EHL 5/5 PF Left lower extremity 5/5 HF 5/5 KE 5/5 DF 5/5 EHL 5/5 PF Neurologic.  Patient alert oriented no substitutions.  No dysarthria.  Names 3/3 objects correctly  He/She  has had improvement in activity tolerance, balance, postural control as well as ability to compensate for deficits. He/She has had  improvement in functional use RUE/LUE  and RLE/LLE as well as improvement in awareness.  Supine to sitting edge of bed standby assist/contact-guard.  Ambulate short distances 50 feet right hand-held assist.  Increasing ambulation up to 133 feet.  Stand pivot wheelchair to bedside commode over toilet with contact-guard light minimal assist.  Standing with light min assist during lower body clothing management.  Set up assist hand hygiene.  Perform PeriCare without assistance.  Full teaching completed plan discharge to home       Disposition:  Discharge disposition: 06-Home-Health Care Svc        Diet: Diabetic diet  Special Instructions: No driving smoking or alcohol  Medications at discharge. 1.  Tylenol as needed 2.  Norvasc 5 mg p.o. daily 3.  Aspirin 81 mg p.o. daily until 05/11/2023 and stop 4.  Lipitor 80 mg p.o. nightly 5.  Plavix 75 mg p.o. daily 6.  Proscar 5 mg p.o. daily 7.  Antivert 12.5 mg 3 times daily as needed dizziness 8.  Toujeo 45 unit into the skin before breakfast and supper 9.  Glucophage 1000 mg mg p.o. twice daily 10.  Protonix 40 mg daily 11.  MiraLAX twice daily hold for loose stools 12.  Flomax 0.4 mg p.o. daily 13.  Tamoxifen 20 mg p.o. daily 14.  Lasix 80 mg p.o. daily 15.  Vitamin C 1000 mg by mouth 3 times weekly 16.  Multivitamin daily 17.  Klor-Con 20 mill equivalents 2 tablets daily 18.  Triamcinolone cream twice daily 19.  Insulin lispro (Humalog KwikPen) 25 units into the skin in the morning 20.  Wellbutrin 75 mg twice daily 21.  Robitussin 10 mL every 6 hours as needed cough    Discharge Instructions     Ambulatory referral to Neurology   Complete by: As directed    An appointment is requested in approximately: 4 weeks right MCA infarction   Ambulatory referral to Physical Medicine Rehab   Complete by: As directed    Moderate complexity follow-up 1 to 2 weeks right MCA infarction      Allergies as of 05/06/2023       Reactions    Ramipril Anaphylaxis   Other Diarrhea   Severe intolerance to Chemotherapy in the past.   Sertraline Other (See Comments)   "Extreme headaches"   Adhesive [tape] Rash     Med Rec must be completed prior to using this [facility]***       Follow-up Information     Kirsteins, Victorino Sparrow, MD Follow up.   Specialty: Physical Medicine and Rehabilitation Why: Office to call for appointment Contact information: [address] [place] [address] [phone]         Annice Pih., MD Follow up.   Specialty: Urology Why: Call for appointment Contact information: [address] [place] [address] [phone]  Tana Conch., MD Follow up.   Specialty: Hematology and Oncology Why: Call for appointment Contact information: [address] [place] [address] [phone]                 Signed: Mcarthur Rossetti Liban [name] 05/06/2023, 5:05 AM

## 2023-04-25 DIAGNOSIS — I63511 Cerebral infarction due to unspecified occlusion or stenosis of right middle cerebral artery: Secondary | ICD-10-CM | POA: Diagnosis not present

## 2023-04-25 DIAGNOSIS — I6389 Other cerebral infarction: Secondary | ICD-10-CM | POA: Insufficient documentation

## 2023-04-25 DIAGNOSIS — L8990 Pressure ulcer of unspecified site, unspecified stage: Secondary | ICD-10-CM | POA: Insufficient documentation

## 2023-04-25 LAB — GLUCOSE, CAPILLARY
Glucose-Capillary: 202 mg/dL — ABNORMAL HIGH (ref 70–99)
Glucose-Capillary: 240 mg/dL — ABNORMAL HIGH (ref 70–99)
Glucose-Capillary: 222 mg/dL — ABNORMAL HIGH (ref 70–99)
Glucose-Capillary: 219 mg/dL — ABNORMAL HIGH (ref 70–99)
Glucose-Capillary: 200 mg/dL — ABNORMAL HIGH (ref 70–99)

## 2023-04-25 LAB — COMPREHENSIVE METABOLIC PANEL
ALT: 41 U/L (ref 0–44)
AST: 35 U/L (ref 15–41)
Albumin: 2.8 g/dL — ABNORMAL LOW (ref 3.5–5.0)
Alkaline Phosphatase: 44 U/L (ref 38–126)
Anion gap: 9 (ref 5–15)
BUN: 16 mg/dL (ref 8–23)
CO2: 22 mmol/L (ref 22–32)
Calcium: 9.3 mg/dL (ref 8.9–10.3)
Chloride: 101 mmol/L (ref 98–111)
Creatinine, Ser: 1.19 mg/dL (ref 0.61–1.24)
GFR, Estimated: >60 mL/min (ref >60)
Glucose, Bld: 206 mg/dL — ABNORMAL HIGH (ref 70–99)
Potassium: 4.3 mmol/L (ref 3.5–5.1)
Sodium: 132 mmol/L — ABNORMAL LOW (ref 135–145)
Total Bilirubin: 1.5 mg/dL — ABNORMAL HIGH (ref 0.0–1.2)
Total Protein: 5.6 g/dL — ABNORMAL LOW (ref 6.5–8.1)

## 2023-04-25 LAB — CBC WITH DIFFERENTIAL/PLATELET
Abs Immature Granulocytes: 0.07 10*3/uL (ref 0.00–0.07)
Basophils Absolute: 0.1 10*3/uL (ref 0.0–0.1)
Basophils Relative: 1 %
Eosinophils Absolute: 0.3 10*3/uL (ref 0.0–0.5)
Eosinophils Relative: 3 %
HCT: 37.9 % — ABNORMAL LOW (ref 39.0–52.0)
Hemoglobin: 12.6 g/dL — ABNORMAL LOW (ref 13.0–17.0)
Immature Granulocytes: 1 %
Lymphocytes Relative: 21 %
Lymphs Abs: 1.8 10*3/uL (ref 0.7–4.0)
MCH: 27.6 pg (ref 26.0–34.0)
MCHC: 33.2 g/dL (ref 30.0–36.0)
MCV: 83.1 fL (ref 80.0–100.0)
Monocytes Absolute: 0.9 10*3/uL (ref 0.1–1.0)
Monocytes Relative: 11 %
Neutro Abs: 5.3 10*3/uL (ref 1.7–7.7)
Neutrophils Relative %: 63 %
Platelets: 209 10*3/uL (ref 150–400)
RBC: 4.56 MIL/uL (ref 4.22–5.81)
RDW: 15.6 % — ABNORMAL HIGH (ref 11.5–15.5)
WBC: 8.3 10*3/uL (ref 4.0–10.5)
nRBC: 0 % (ref 0.0–0.2)

## 2023-04-25 MED ORDER — AMLODIPINE BESYLATE 10 MG PO TABS
10.0000 mg | ORAL_TABLET | Freq: Every day | ORAL | Status: DC
Start: 2023-04-26 — End: 2023-05-05
  Administered 2023-04-26 – 2023-05-05 (×10): 10 mg via ORAL
  Filled 2023-04-25 (×10): qty 1

## 2023-04-25 MED ORDER — INSULIN ASPART PROT & ASPART (70-30 MIX) 100 UNIT/ML ~~LOC~~ SUSP
22.0000 [IU] | Freq: Two times a day (BID) | SUBCUTANEOUS | Status: DC
  Administered 2023-04-25 – 2023-05-07 (×22): 22 [IU] via SUBCUTANEOUS
  Filled 2023-04-25 (×2): qty 10

## 2023-04-25 NOTE — IPOC Note (Addendum)
 Overall Plan of Care [facility]) Patient Details Name: John [name] MRN: [id number] DOB: November 22, 1945  Admitting Diagnosis:Brainstem infarct   Hospital Problems: Principal Problem:   Right middle cerebral artery stroke [facility])     Functional Problem List: Nursing Bowel, Bladder, Safety, Endurance, Medication Management  PT Balance, Safety, Sensory, Behavior, Endurance, Motor, Perception  OT Balance, Edema, Endurance, Motor, Perception, Safety, Sensory, Skin Integrity  SLP    TR         Basic ADL's: OT Bathing, Dressing, Toileting     Advanced  ADL's: OT Simple Meal Preparation     Transfers: PT Bed Mobility, Bed to Chair, Car, Furniture, Floor  OT Toilet, Tub/Shower     Locomotion: PT Stairs, Ambulation     Additional Impairments: OT    SLP        TR      Anticipated Outcomes Item Anticipated Outcome  Self Feeding    Swallowing      Basic self-care  Mod I  Toileting  Mod I   Bathroom Transfers Mod I  Bowel/Bladder  manage bowel and bladder with mod I assist  Transfers  mod-I using LRAD  Locomotion  supervision using LRAD  Communication     Cognition     Pain  < 4 wtih prns  Safety/Judgment  manage w cues   Therapy Plan: PT Intensity: Minimum of 1-2 x/day ,45 to 90 minutes PT Frequency: 5 out of 7 days PT Duration Estimated Length of Stay: ~1.5 weeks OT Intensity: Minimum of 1-2 x/day, 45 to 90 minutes OT Frequency: 5 out of 7 days OT Duration/Estimated Length of Stay: 7-10 days     Team Interventions: Nursing Interventions Bladder Management, Bowel Management, Medication Management, Pain Management, Disease Management/Prevention, Patient/Family Education, Discharge Planning  PT interventions Ambulation/gait training, Community reintegration, DME/adaptive equipment instruction, Neuromuscular re-education, Psychosocial support, Stair training, UE/LE Strength taining/ROM, Warden/ranger, Discharge planning, Functional electrical  stimulation, Pain management, Skin care/wound management, Therapeutic Activities, UE/LE Coordination activities, Cognitive remediation/compensation, Disease management/prevention, Functional mobility training, Patient/family education, Splinting/orthotics, Therapeutic Exercise, Visual/perceptual remediation/compensation  OT Interventions Warden/ranger, Cognitive remediation/compensation, Community reintegration, Discharge planning, Disease mangement/prevention, DME/adaptive equipment instruction, Functional electrical stimulation, Functional mobility training, Neuromuscular re-education, Pain management, Patient/family education, Psychosocial support, Splinting/orthotics, Skin care/wound managment, Self Care/advanced ADL retraining, Therapeutic Activities, Therapeutic Exercise, UE/LE Strength taining/ROM, Visual/perceptual remediation/compensation, UE/LE Coordination activities  SLP Interventions    TR Interventions    SW/CM Interventions Discharge Planning, Psychosocial Support, Patient/Family Education   Barriers to Discharge MD   Fall risk  Nursing Decreased caregiver support, Home environment access/layout 2 level 3 ste right rail w spouse  PT Inaccessible home environment, Home environment access/layout, Decreased caregiver support    OT Weight    SLP      SW Insurance for SNF coverage     Team Discharge Planning: Destination: PT-Home ,OT- Home , SLP-  Projected Follow-up: PT-Outpatient PT, 24 hour supervision/assistance, OT-  None, SLP-  Projected Equipment Needs: PT-To be determined, OT- To be determined, SLP-  Equipment Details: PT- , OT-  Patient/family involved in discharge planning: PT- Patient,  OT-Patient, SLP-   MD ELOS: 7-10d Medical Rehab Prognosis:  Good Assessment: The patient has been admitted for CIR therapies with the diagnosis of brainstem infarct . The team will be addressing functional mobility, strength, stamina, balance, safety, adaptive techniques  and equipment, self-care, bowel and bladder mgt, patient and caregiver education, fall risk reduction . Goals have been set at mod I. Anticipated discharge destination is Home with  wife .        See Team Conference Notes for weekly updates to the plan of care

## 2023-04-25 NOTE — Progress Notes (Signed)
 Inpatient Rehabilitation Care Coordinator Assessment and Plan Patient Details  Name: John [name] MRN: [id number] Date of Birth: 1945-08-29  Today's Date: 04/25/2023  Hospital Problems: Principal Problem:   Right middle cerebral artery stroke [facility])  Past Medical History:  Past Medical History:  Diagnosis Date   BPH (benign prostatic hyperplasia)    CTS (carpal tunnel syndrome)    Depression    Diabetes mellitus    Essential hypertension    GERD (gastroesophageal reflux disease)    History of breast cancer    Hypercholesteremia    Neuropathy    Past Surgical History:  Past Surgical History:  Procedure Laterality Date   CATARACT EXTRACTION, BILATERAL  01/18/2017   LITHOTRIPSY     MASTECTOMY Right 05/02/2018   TONSILLECTOMY AND ADENOIDECTOMY     Social History:  reports that he has never smoked. He has never used smokeless tobacco. He reports that he does not drink alcohol and does not use drugs.  Family / Support Systems Marital Status: Married Patient Roles: Spouse, Parent Spouse/Significant Other: Orie [phone] Children: Two children one in [place] and [place] see when can Other Supports: Friends and church members Anticipated Caregiver: wife Ability/Limitations of Caregiver: In good health according to pt Caregiver Availability: 24/7 Family Dynamics: Close knit with children and friends, feels has good supports but wants to be mod/i level before going home  Social History Preferred language: English Religion:  Cultural Background: No issues Education: [employer] school Health Literacy - How often do you need to have someone help you when you read instructions, pamphlets, or other written material from your doctor or pharmacy?: Never Writes: Yes Employment Status: Retired Marine Scientist Issues: No issues Guardian/Conservator: None-according to MD pt is capable of making his own decisions while here   Abuse/Neglect Abuse/Neglect  Assessment Can Be Completed: Yes Physical Abuse: Denies Verbal Abuse: Denies Sexual Abuse: Denies Exploitation of patient/patient's resources: Denies Self-Neglect: Denies  Patient response to: Social Isolation - How often do you feel lonely or isolated from those around you?: Never  Emotional Status Pt's affect, behavior and adjustment status: Pt is motivated to recover and get back to his independent level. He is not one to rely upon others and wants to not burden his wife. He has had issues in the past and remained independent Recent Psychosocial Issues: other health issues Psychiatric History: No history would benefit from seeing neuro-psych if still here when he returns Substance Abuse History: No issues  Patient / Family Perceptions, Expectations & Goals Pt/Family understanding of illness & functional limitations: Pt can explain his stroke and deficits he has had vertigo before and it seems to come and go. He does talk with the MD's involved and feels understands his plan moving forward. Premorbid pt/family roles/activities: husband, father, retiree, church member, neighbor, catering manager Anticipated changes in roles/activities/participation: resume Pt/family expectations/goals: Pt states:  I want to be able to move around on my own and not need my wife right there.  Community [employer] Agencies: None Premorbid Home Care/DME Agencies: Other (Comment) (rw, bsc) Transportation available at discharge: wife Is the patient able to respond to transportation needs?: Yes In the past 12 months, has lack of transportation kept you from medical appointments or from getting medications?: No In the past 12 months, has lack of transportation kept you from meetings, work, or from getting things needed for daily living?: No Resource referrals recommended: Neuropsychology  Discharge Planning Living Arrangements: Spouse/significant other Support Systems: Spouse/significant other, Children,  Friends/neighbors, Church/faith community Type  of Residence: Private residence [employer]: Media Planner (specify) Passenger Transport Manager) Surveyor, Quantity Resources: Tree Surgeon, Family Support Financial Screen Referred: No Living Expenses: Own Money Management: Patient, Spouse Does the patient have any problems obtaining your medications?: No Home Management: both Patient/Family Preliminary Plans: Return home with wife who will be his caregiver if needed. he plans on not needing her assist. Aware goals are supervision -mod/i level. Care Coordinator Barriers to Discharge: Insurance for SNF coverage Care Coordinator Anticipated Follow Up Needs: HH/OP  Clinical Impression Pleasant gentleman who has been through vertigo before and feels this is similar. He hopes to be mod/I and not need his wife's help at discharge. Will work on discharge needs, will place on neuro-psych list if still here when he returns.  Raymonde Asberry MATSU 04/25/2023, 9:17 AM

## 2023-04-25 NOTE — Progress Notes (Signed)
 Physical Therapy Session Note  Patient Details  Name: John [name] MRN: [id number] Date of Birth: Aug 27, 1945  Today's Date: 04/25/2023 PT Individual Time: 8894-8850 PT Individual Time Calculation (min): 44 min   Short Term Goals: Week 1:  PT Short Term Goal 1 (Week 1): Pt will perform sit<>stands using LRAD with CGA PT Short Term Goal 2 (Week 1): Pt will perform bed<>chair transfers using LRAD with CGA PT Short Term Goal 3 (Week 1): Pt will ambulate at least 123ft using LRAD with CGA PT Short Term Goal 4 (Week 1): Pt will navigate at least 8 steps using HRs with CGA  Skilled Therapeutic Interventions/Progress Updates:    Pt reporting not having the best morning. Focused on transfers, gait, balance,and overall endurance. Pt performed bed mobility with supervision to come to EOB and dons shoes. Min assist for sit > stand from EOB and transfers without device to w/c with min assist but cues needed for safer technique - poor eccentric control to the w/c. Performed ambulatory transfer without device to mat table with min to mod assist due to R lean and LOB requiring assist. Blocked practice sit <> stands with focus on postural control and balance with min assist. Progressed to trial with RW for UE support and to attempt gait forwards and backwards x 10' each x 4 trials with improved stepping pattern retro gait on last attempt - pt crossing over midline at first and required min assist for balance. Gait training with RW x 75' wit min assist and cues for safe placement of RW during turn and min for balance. Significant fatigue after this and extended rest break with pt stating he was done with session due to fatigue. Encouraged rest break and to attempt something else. Pt chose to work on walking again with goal to walk back to room and able ot perform x 150' with min assist as decribed above and returned back to bed with supervision. Offered pt to finish session in room but again declined due to  fatigue. Educated on goals and progress to provide encouragement. Pt may benefit from neuropsych consult.   Pt missed 16 min of skilled PT.  Therapy Documentation Precautions:  Precautions Precautions: Fall, Other (comment) Precaution Comments: Chronic vertigo; HOH Restrictions Weight Bearing Restrictions Per Provider Order: No  Pain: Denies pain.    Therapy/Group: Individual Therapy  Elnor Pizza Sherrell Pizza WENDI Elnor, PT, DPT, CBIS  04/25/2023, 12:06 PM

## 2023-04-25 NOTE — Progress Notes (Signed)
 Occupational Therapy Session Note  Patient Details  Name: John [name] MRN: [id number] Date of Birth: 17-Dec-1945  Today's Date: 04/25/2023 OT Individual Time: 9097-9063 OT Individual Time Calculation (min): 34 min  and Today's Date: 04/25/2023 OT Missed Time: 26 Minutes Missed Time Reason: Patient fatigue and OT Individual Time: 1416-1530 OT Individual Time Calculation (min): 74 min   Short Term Goals: Week 1:  OT Short Term Goal 1 (Week 1): STGs=LTGs due patient's estimated length of stay.  Skilled Therapeutic Interventions/Progress Updates:     AM Session:  Pt received reclined in bed, dressed for the day upon OT arrival. Pt presenting to be in good spirits receptive to skilled OT session reporting 0/10 pain- OT offering intermittent rest breaks, repositioning, and therapeutic support to optimize participation in therapy session. Focus this session dynamic balance and activity tolerance within context of BADLs and Pt education. Pt transitioned to EOB using bed features with supervision +increased time. Sitting EOB, Pt requiring significantly increased amount of time to rest d/t dizziness symptoms with Pt stating my head is just not ready for this yet. Provided therapeutic support and education on impact of CVA on vestibular system and treatment of chronic vertigo symptoms with Pt receptive to education. Donned knee high TEDs and shoes total A for time management. Engaged Pt in completing grooming/hygiene tasks standing at sink for BADL retraining and to work on dynamic standing balance. Sit > stand from EOB to RW CGA. Pt completed functional mobility to sink using RW with light min A provided for balance. Pt able to complete oral care and groom hair in standing with L UE positioned on sink for majority of task CGA. Pt presenting with mild R posterior lean during task requiring mod verbal and tactile cues to correct. Fatigue reported following with seated rest break provided. Pt then  reporting need to use restroom. Engaged Pt in completing functional mobility to bathroom using RW for endurance and functional mobility training- min A required for balance and mod verbal cue for head positioning and safety for orientation at toilet prior to sitting. Increased time provided on toilet d/t need for BM- Pt with continent void, however no BM at this time. Pt able to doff/donn pants from waist in standing position with CGA using RW and complete posterior peri-care while seated with distant supervision. Pt completed functional mobility back to bed room using RW and transferred to EOB with CGA. Pt requesting to end session early stating I'm too tired for this today, I just need to rest. Maximal therapeutic support and encouragement provided and education provided on purpose of therapy to increase independence/safety prior to d/c, however Pt continued to decline remainder of session. Education provided on impact of CVA on energy levels and CVA recovery with Pt receptive to education. Pt returned to bed with close supervision using bed features. Pt was left resting in bed with call bell in reach, bed alarm on, and all needs met.  Missed 26 minutes of skilled therapy session d/t fatigue, will attempt to make up time as schedule and Pt's status allow.   PM Session:  Pt received lightly sleeping in bed with wife present in room. Pt presenting to be in good spirits receptive to skilled OT session reporting 0/10 pain, however presenting extreme fatigue- OT offering intermittent rest breaks, repositioning, and therapeutic support to optimize participation in therapy session. Focus this session d/c planning, activity tolerance, general strengthening, and dynamic balance. Focused beginning of session on discussing DME needs and providing education on  options for DME. Pt's wife reporting she has a RW, BSC, and a shower chair at home that fits into their walk-in shower. Pt's wife also inquiring about getting a  stairlift to allow Pt to safely go up the 14 stairs to get to his bedroom and bathroom and getting a lift chair to increase his independence in sit<>stands and mobility in the home. Informed SW, Rhoda, of Pt and Pt's family questions and SW entered into session to speak with Pt's and address all inquires.  Pt completed functional mobility to/from therapy spaces during session using RW for endurance training with Pt able to tolerate ambulating ~150 ft x2 trials CGA with mod verbal cues provided for R foot positioning and head/trunk positioning with seated rest breaks provided following.   Engaged Pt in dynamic standing balance activities using RW at elevated table top to work on standing tolerance and dynamic balance for BADLs. Pt able to tolerate standing ~2 minutes x3 trials with prolonged rest breaks provided between trials.  Pt completed the following exercises sitting EOM using 5# weighted dowel to increase B UE strength and activity tolerance for BADLs: -Chest press 1x10 reps -Bicep curls 1x10 reps -Overhead press 1x10 reps -Shoulder flexion 1x10 reps Mod verbal cues required for motivation and technique during exercises and increased time for rest break required between trials. Pt continuing to present with significant balance and endurance challenges impacting his safety and independence in BADLs.   Pt extremely fatigued at end of session and requesting to return to bed. EOB>supine supervision using bed features. Pt was left resting in bed with call bell in reach, bed alarm on, and all needs met.    Therapy Documentation Precautions:  Precautions Precautions: Fall, Other (comment) Precaution Comments: Chronic vertigo; HOH Restrictions Weight Bearing Restrictions Per Provider Order: No  Therapy/Group: Individual Therapy  John [name] 04/25/2023, 7:51 AM

## 2023-04-25 NOTE — Progress Notes (Signed)
 PROGRESS NOTE   Subjective/Complaints:  No issues overnite , discussed his hx of intermittent dizziness since April  as well as hx of R breast Ca  ROS: as per HPI. Denies CP, SOB, abd pain, N/V/D/C, or any other complaints at this time.      Objective:   No results found. Recent Labs    04/25/23 0522  WBC 8.3  HGB 12.6*  HCT 37.9*  PLT 209   Recent Labs    04/25/23 0522  NA 132*  K 4.3  CL 101  CO2 22  GLUCOSE 206*  BUN 16  CREATININE 1.19  CALCIUM  9.3    Intake/Output Summary (Last 24 hours) at 04/25/2023 1028 Last data filed at 04/25/2023 0835 Gross per 24 hour  Intake 952 ml  Output --  Net 952 ml     Pressure Injury 04/22/23 Buttocks Right;Left Stage 1 -  Intact skin with non-blanchable redness of a localized area usually over a bony prominence. Pink area on bilateral buttocks (Active)  04/22/23 1530  Location: Buttocks  Location Orientation: Right;Left  Staging: Stage 1 -  Intact skin with non-blanchable redness of a localized area usually over a bony prominence.  Wound Description (Comments): Pink area on bilateral buttocks  Present on Admission: Yes    Physical Exam: Vital Signs Blood pressure (!) 154/82, pulse 83, temperature 98.2 F (36.8 C), resp. rate 17, height 6' (1.829 m), weight 101.9 kg, SpO2 95%.    Physical Exam  General: No acute distress Mood and affect are appropriate Heart: Regular rate and rhythm no rubs murmurs or extra sounds Chest - s/p right masectomy  Lungs: Clear to auscultation, breathing unlabored, no rales or wheezes Abdomen: Positive bowel sounds, soft nontender to palpation, nondistended Extremities: No clubbing, cyanosis, or edema Skin: No evidence of breakdown, no evidence of rash Neurologic: Cranial nerves II through XII intact, motor strength is 5/5 in bilateral deltoid, bicep, tricep, grip, hip flexor, knee extensors, ankle dorsiflexor and plantar  flexor  Cerebellar exam normal finger to nose to finger as well as heel to shin in bilateral upper and lower extremities Musculoskeletal: Full range of motion in all 4 extremities. No joint swelling    Assessment/Plan: 1. Functional deficits which require 3+ hours per day of interdisciplinary therapy in a comprehensive inpatient rehab setting. Physiatrist is providing close team supervision and 24 hour management of active medical problems listed below. Physiatrist and rehab team continue to assess barriers to discharge/monitor patient progress toward functional and medical goals  Care Tool:  Bathing    Body parts bathed by patient: Right arm, Left arm, Chest, Abdomen, Front perineal area, Right upper leg, Left upper leg, Face, Buttocks, Left lower leg, Right lower leg   Body parts bathed by helper: Buttocks, Left lower leg, Right lower leg     Bathing assist Assist Level: Minimal Assistance - Patient > 75%     Upper Body Dressing/Undressing Upper body dressing   What is the patient wearing?: Pull over shirt    Upper body assist Assist Level: Supervision/Verbal cueing    Lower Body Dressing/Undressing Lower body dressing      What is the patient wearing?: Pants  Lower body assist Assist for lower body dressing: Minimal Assistance - Patient > 75%     Toileting Toileting Toileting Activity did not occur (Clothing management and hygiene only): N/A (no void or bm)  Toileting assist Assist for toileting: Minimal Assistance - Patient > 75%     Transfers Chair/bed transfer  Transfers assist     Chair/bed transfer assist level: Moderate Assistance - Patient 50 - 74%     Locomotion Ambulation   Ambulation assist      Assist level: Moderate Assistance - Patient 50 - 74% Assistive device: Hand held assist Max distance: 90ft   Walk 10 feet activity   Assist     Assist level: Moderate Assistance - Patient - 50 - 74% Assistive device: Hand held assist    Walk 50 feet activity   Assist    Assist level: Moderate Assistance - Patient - 50 - 74% Assistive device: Hand held assist    Walk 150 feet activity   Assist Walk 150 feet activity did not occur: Safety/medical concerns         Walk 10 feet on uneven surface  activity   Assist Walk 10 feet on uneven surfaces activity did not occur: Safety/medical concerns         Wheelchair     Assist Is the patient using a wheelchair?: Yes (for transport b/c of impaired activity tolerance limiting gait distance) Type of Wheelchair: Manual    Wheelchair assist level: Dependent - Patient 0%      Wheelchair 50 feet with 2 turns activity    Assist        Assist Level: Dependent - Patient 0%   Wheelchair 150 feet activity     Assist      Assist Level: Dependent - Patient 0%   Blood pressure (!) 154/82, pulse 83, temperature 98.2 F (36.8 C), resp. rate 17, height 6' (1.829 m), weight 101.9 kg, SpO2 95%.  Medical Problem List and Plan: 1. 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  Antithrombotics: -DVT/anticoagulation:  Mechanical: Antiembolism stockings, thigh (TED hose) Bilateral lower extremities -antiplatelet therapy: Aspirin  81 mg daily and Plavix  75 mg daily x 3 weeks then Plavix  alone 3. Pain Management: Tylenol  as needed 4. Mood/Behavior/Sleep: Not emotional support             -antipsychotic agents: N/A 5. Neuropsych/cognition: This patient is capable of making decisions on his own behalf. 6. Skin/Wound Care: Routine skin checks 7. Fluids/Electrolytes/Nutrition: Routine in and outs with follow-up chemistries 8.  Hypertension.  Norvasc  5 mg daily.  Monitor with increased mobility  -1/11 intermittently elevated continue to monitor -04/24/23 BPs fine, cont  regimen; of note, pharmacy stated he was on furosemide  80mg  daily but down to 40mg  daily on inpatient, but not resumed here   Vitals:   04/22/23 1539 04/22/23 1953 04/23/23 0430 04/23/23 0841  BP: 135/71 125/63 (!) 151/72 132/75   04/23/23 1409 04/23/23 1932 04/24/23 0644 04/24/23 0731  BP: 133/76 137/69 111/61 127/74   04/24/23 1708 04/24/23 1946 04/25/23 0507  BP: (!) 142/65 (!) 141/60 (!) 154/82   Increase amlodipine  to 10mg     9.  Diabetes mellitus peripheral neuropathy.  Latest hemoglobin A1c 12.3.  Currently on NovoLog  19 units twice daily.  Diabetic teaching.  Patient's home regimen of Glucophage  1000 mg twice daily as well as Toujeo  15 units in the morning and 70 units in the evening-- currently held.  Resume as needed 1/11 CBGs mildly elevated, restart metformin  XR at 500 mg dose, continue current dose of 70/30 for now -04/24/23 CBGs in the 200s, recent med adjustment, monitor for trend with that change, but suspect he'll likely need further adjustments CBG (last 3)  Recent Labs    04/24/23 1704 04/24/23 2046 04/25/23 0619  GLUCAP 222* 223* 202*   Increase novalog 70/30 to 22U BID  10.  Chronic vertigo.  Continue Antivert  12.5 mg 3 times daily prn.  Patient followed outpatient by ENT   11.  BPH.  Proscar  5 mg daily, Flomax  0.4 mg daily.  Check PVR.  Followed by urology Dr. Evalene Nam [facility]  -1/11 continent continue current regimen  -04/24/23 no PVRs documented but continent, cont regimen   12.  History of breast cancer.  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  Status post modified radical mastectomy and chemotherapy.  Patient on tamoxifen  20mg  daily since April 2020 follow-up with outpatient oncology Dr Leverette Vallathucherry and resume tamoxifen  as outpatient.   13.  GERD.  Protonix  40mg  daily   14.  AKI on CKD.  Creatinine baseline 1.29-1.64.  Follow-up chemistries   15.  Obesity.  BMI 30.45.  Dietary follow-up  16. Constipation: on miralax  BID -04/24/23 reports  LBM was yesterday or two days ago, no documentation of BMs on file-- also refusing miralax ; if no BM by tomorrow, would consider encouraging pt to use miralax , or using alternative bowel regimen that he'd be willing to use.       LOS: 3 days A FACE TO FACE EVALUATION WAS PERFORMED  John [name] 04/25/2023, 10:28 AM

## 2023-04-25 NOTE — Progress Notes (Signed)
 Inpatient Rehabilitation Center Individual Statement of Services  Patient Name:  John [name]  Date:  04/25/2023  Welcome to the Inpatient Rehabilitation Center.  Our goal is to provide you with an individualized program based on your diagnosis and situation, designed to meet your specific needs.  With this comprehensive rehabilitation program, you will be expected to participate in at least 3 hours of rehabilitation therapies Monday-Friday, with modified therapy programming on the weekends.  Your rehabilitation program will include the following services:  Physical Therapy (PT), Occupational Therapy (OT), 24 hour per day rehabilitation nursing, Therapeutic Recreaction (TR), Neuropsychology, Care Coordinator, Rehabilitation Medicine, Nutrition Services, and Pharmacy Services  Weekly team conferences will be held on Wednesday to discuss your progress.  Your Inpatient Rehabilitation Care Coordinator will talk with you frequently to get your input and to update you on team discussions.  Team conferences with you and your family in attendance may also be held.  Expected length of stay: 1.5 weeks  Overall anticipated outcome: supervision-mod/I level  Depending on your progress and recovery, your program may change. Your Inpatient Rehabilitation Care Coordinator will coordinate services and will keep you informed of any changes. Your Inpatient Rehabilitation Care Coordinator's name and contact numbers are listed  below.  The following services may also be recommended but are not provided by the Inpatient Rehabilitation Center:   Home Health Rehabiltiation Services Outpatient Rehabilitation Services    Arrangements will be made to provide these services after discharge if needed.  Arrangements include referral to agencies that provide these services.  Your insurance has been verified to be:  [employer] Your primary doctor is:  Pharmacist, [facility]  Pertinent information will be shared with  your doctor and your insurance company.  Inpatient Rehabilitation Care Coordinator:  Rhoda Clement, KEN [phone] or ELIGAH BASQUES  Information discussed with and copy given to patient by: Clement Asberry MATSU, 04/25/2023, 9:19 AM

## 2023-04-25 NOTE — Progress Notes (Signed)
 Patient ID: John [name], male   DOB: 1946-02-16, 78 y.o.   MRN: [id number]  Met with wife who is here letting worker know she called to see if their insurance would cover the stair lift and lift chair she would like for him to have. She was told with a MD prescription they would cover. Discussed with the lift chair they may cover the mechanism but would need to pay up front and get remibursed. Wife feels even prior to admission he had difficulty doing stairs and getting up from a chair. Will get her the prescriptions and let wife pursue both

## 2023-04-25 NOTE — Progress Notes (Signed)
 Inpatient Rehabilitation  Patient information reviewed and entered into eRehab system by Burnard Mealing, OTR/L, Rehab Quality Coordinator.   Information including medical coding, functional ability and quality indicators will be reviewed and updated through discharge.

## 2023-04-26 ENCOUNTER — Encounter (HOSPITAL_COMMUNITY): Payer: Self-pay | Admitting: Physical Medicine & Rehabilitation

## 2023-04-26 DIAGNOSIS — I63511 Cerebral infarction due to unspecified occlusion or stenosis of right middle cerebral artery: Secondary | ICD-10-CM | POA: Diagnosis not present

## 2023-04-26 LAB — GLUCOSE, CAPILLARY
Glucose-Capillary: 162 mg/dL — ABNORMAL HIGH (ref 70–99)
Glucose-Capillary: 156 mg/dL — ABNORMAL HIGH (ref 70–99)
Glucose-Capillary: 186 mg/dL — ABNORMAL HIGH (ref 70–99)
Glucose-Capillary: 158 mg/dL — ABNORMAL HIGH (ref 70–99)

## 2023-04-26 NOTE — Plan of Care (Signed)
 Behavioral Plan Guideline Note  Patient Details  Name: John [name] MRN: [id number] Date of Birth: [birth year]  Pt's plan of care adjusted to 15/7 after speaking with care team and discussed with MD as pt currently unable to tolerate current therapy schedule with OT, PT, and SLP.   John [name] , PT, DPT, NCS, CSRS 04/26/2023, 9:56 AM

## 2023-04-26 NOTE — Progress Notes (Signed)
 PROGRESS NOTE   Subjective/Complaints:  Per PT, OT, poor endurance   ROS: as per HPI. Denies CP, SOB, abd pain, N/V/D/C, or any other complaints at this time.      Objective:   No results found. Recent Labs    04/25/23 0522  WBC 8.3  HGB 12.6*  HCT 37.9*  PLT 209   Recent Labs    04/25/23 0522  NA 132*  K 4.3  CL 101  CO2 22  GLUCOSE 206*  BUN 16  CREATININE 1.19  CALCIUM  9.3    Intake/Output Summary (Last 24 hours) at 04/26/2023 0920 Last data filed at 04/26/2023 9164 Gross per 24 hour  Intake 480 ml  Output --  Net 480 ml     Pressure Injury 04/22/23 Buttocks Right;Left Stage 1 -  Intact skin with non-blanchable redness of a localized area usually over a bony prominence. Pink area on bilateral buttocks (Active)  04/22/23 1530  Location: Buttocks  Location Orientation: Right;Left  Staging: Stage 1 -  Intact skin with non-blanchable redness of a localized area usually over a bony prominence.  Wound Description (Comments): Pink area on bilateral buttocks  Present on Admission: Yes    Physical Exam: Vital Signs Blood pressure (!) 146/71, pulse 91, temperature 98.5 F (36.9 C), resp. rate 16, height 6' (1.829 m), weight 101.9 kg, SpO2 98%.    Physical Exam  General: No acute distress Mood and affect are appropriate Heart: Regular rate and rhythm no rubs murmurs or extra sounds Chest - s/p right masectomy  Lungs: Clear to auscultation, breathing unlabored, no rales or wheezes Abdomen: Positive bowel sounds, soft nontender to palpation, nondistended Extremities: No clubbing, cyanosis, or edema Skin: No evidence of breakdown, no evidence of rash Neurologic: Cranial nerves II through XII intact, motor strength is 5/5 in bilateral deltoid, bicep, tricep, grip, hip flexor, knee extensors, ankle dorsiflexor and plantar flexor  Cerebellar exam normal finger to nose to finger as well as heel to shin in  bilateral upper and lower extremities Musculoskeletal: Full range of motion in all 4 extremities. No joint swelling    Assessment/Plan: 1. Functional deficits which require 3+ hours per day of interdisciplinary therapy in a comprehensive inpatient rehab setting. Physiatrist is providing close team supervision and 24 hour management of active medical problems listed below. Physiatrist and rehab team continue to assess barriers to discharge/monitor patient progress toward functional and medical goals  Care Tool:  Bathing    Body parts bathed by patient: Right arm, Left arm, Chest, Abdomen, Front perineal area, Right upper leg, Left upper leg, Face, Buttocks, Left lower leg, Right lower leg   Body parts bathed by helper: Buttocks, Left lower leg, Right lower leg     Bathing assist Assist Level: Minimal Assistance - Patient > 75%     Upper Body Dressing/Undressing Upper body dressing   What is the patient wearing?: Pull over shirt    Upper body assist Assist Level: Supervision/Verbal cueing    Lower Body Dressing/Undressing Lower body dressing      What is the patient wearing?: Pants     Lower body assist Assist for lower body dressing: Minimal Assistance - Patient > 75%  Toileting Toileting Toileting Activity did not occur Press Photographer and hygiene only): N/A (no void or bm)  Toileting assist Assist for toileting: Minimal Assistance - Patient > 75%     Transfers Chair/bed transfer  Transfers assist     Chair/bed transfer assist level: Minimal Assistance - Patient > 75%     Locomotion Ambulation   Ambulation assist      Assist level: Minimal Assistance - Patient > 75% Assistive device: Walker-rolling Max distance: 150   Walk 10 feet activity   Assist     Assist level: Minimal Assistance - Patient > 75% Assistive device: Walker-rolling   Walk 50 feet activity   Assist    Assist level: Minimal Assistance - Patient > 75% Assistive  device: Walker-rolling    Walk 150 feet activity   Assist Walk 150 feet activity did not occur: Safety/medical concerns  Assist level: Minimal Assistance - Patient > 75% Assistive device: Walker-rolling    Walk 10 feet on uneven surface  activity   Assist Walk 10 feet on uneven surfaces activity did not occur: Safety/medical concerns         Wheelchair     Assist Is the patient using a wheelchair?: Yes (for transport b/c of impaired activity tolerance limiting gait distance) Type of Wheelchair: Manual    Wheelchair assist level: Dependent - Patient 0%      Wheelchair 50 feet with 2 turns activity    Assist        Assist Level: Dependent - Patient 0%   Wheelchair 150 feet activity     Assist      Assist Level: Dependent - Patient 0%   Blood pressure (!) 146/71, pulse 91, temperature 98.5 F (36.9 C), resp. rate 16, height 6' (1.829 m), weight 101.9 kg, SpO2 98%.  Medical Problem List and Plan: 1. 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  Antithrombotics: -DVT/anticoagulation:  Mechanical: Antiembolism stockings, thigh (TED hose) Bilateral lower extremities -antiplatelet therapy: Aspirin  81 mg daily and Plavix  75 mg daily x 3 weeks then Plavix  alone 3. Pain Management: Tylenol  as needed 4. Mood/Behavior/Sleep: Not emotional support             -antipsychotic agents: N/A 5. Neuropsych/cognition: This patient is capable of making decisions on his own behalf. 6. Skin/Wound Care: Routine skin checks 7. Fluids/Electrolytes/Nutrition: Routine in and outs with follow-up chemistries 8.  Hypertension.  Norvasc  5 mg daily.  Monitor with increased mobility  -1/11 intermittently elevated continue to monitor -04/24/23 BPs fine, cont regimen; of note, pharmacy stated he  was on furosemide  80mg  daily but down to 40mg  daily on inpatient, but not resumed here   Vitals:   04/23/23 0841 04/23/23 1409 04/23/23 1932 04/24/23 0644  BP: 132/75 133/76 137/69 111/61   04/24/23 0731 04/24/23 1708 04/24/23 1946 04/25/23 0507  BP: 127/74 (!) 142/65 (!) 141/60 (!) 154/82   04/25/23 1626 04/25/23 1934 04/26/23 0457 04/26/23 0826  BP: (!) 144/72 133/62 (!) 160/75 (!) 146/71   Increase amlodipine  to 10mg  on 1/14 monitor     9.  Diabetes mellitus peripheral neuropathy.  Latest hemoglobin A1c 12.3.  Currently on NovoLog  70/30 22 units twice daily.  Diabetic teaching.  Patient's home regimen of Glucophage  1000 mg twice daily as well as Toujeo  15 units in the morning and 70 units in the evening-- currently held.  Resume as needed 1/11 CBGs mildly elevated, restart metformin  XR at 500 mg dose, continue current dose of 70/30 for now -04/24/23 CBGs in the 200s, recent med adjustment, monitor for trend with that change, but suspect he'll likely need further adjustments CBG (last 3)  Recent Labs    04/25/23 1900 04/25/23 2035 04/26/23 0614  GLUCAP 219* 200* 162*   Increase novalog 70/30 to 22U BID first dose 1/13 pm , improved CBG this am , may be able to d/c metformin   10.  Chronic vertigo.  Continue Antivert  12.5 mg 3 times daily prn.  Patient followed outpatient by ENT   11.  BPH.  Proscar  5 mg daily, Flomax  0.4 mg daily.  Check PVR.  Followed by urology Dr. Evalene Nam [facility]  -1/11 continent continue current regimen  -04/24/23 no PVRs documented but continent, cont regimen   12.  History of breast cancer.  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  Status post modified radical mastectomy and chemotherapy.  Patient on tamoxifen  20mg  daily since April 2020 follow-up with outpatient oncology Dr Leverette Vallathucherry and resume tamoxifen  as outpatient.   13.  GERD.  Protonix  40mg  daily   14.  AKI on CKD.  Creatinine baseline 1.29-1.64.  Follow-up chemistries   15.  Obesity.   BMI 30.45.  Dietary follow-up  16. Constipation: on miralax  BID- large cont liquid BM this am  -04/24/23 reports LBM was yesterday or two days ago, no documentation of BMs on file-- also refusing miralax ; if no BM by tomorrow, would consider encouraging pt to use miralax , or using alternative bowel regimen that he'd be willing to use.       LOS: 4 days A FACE TO FACE EVALUATION WAS PERFORMED  John [name] 04/26/2023, 9:20 AM

## 2023-04-26 NOTE — Progress Notes (Signed)
 Physical Therapy Session Note  Patient Details  Name: John [name] MRN: [id number] Date of Birth: Sep 19, 1945  Today's Date: 04/26/2023 PT Individual Time: 0920-1015 and 8361-8344 PT Individual Time Calculation (min): 55 min and 17 min And Today's Date: 04/26/2023 PT Missed Time: 43 Minutes Missed Time Reason: Patient fatigue  Short Term Goals: Week 1:  PT Short Term Goal 1 (Week 1): Pt will perform sit<>stands using LRAD with CGA PT Short Term Goal 2 (Week 1): Pt will perform bed<>chair transfers using LRAD with CGA PT Short Term Goal 3 (Week 1): Pt will ambulate at least 1101ft using LRAD with CGA PT Short Term Goal 4 (Week 1): Pt will navigate at least 8 steps using HRs with CGA  Skilled Therapeutic Interventions/Progress Updates:    Session 1: Pt received supine in bed, awake reporting having difficulty performing peri-care on toilet after BM with nursing staff. Pt agreeable to therapy session with encouragement. MD in/out for morning assessment - discussed pt's R posterior lean with functional mobility and pt's impaired endurance impacting his ability to participate fully in current therapy schedule as was also discussed with OT. Pt reports that he just gets tired and feels like he needs to sit down. Reports his wife said to him yesterday: Do what they tell you and don't quit.   Pt's wife called and discussed plan for team conference tomorrow with SW providing an update afterwards.   Supine>sitting R EOB, HOB partially elevated and using bedrail, with supervision and increased time/effort.  Sit<>stands, no AD, with CGA for steadying and pt often using backs of legs against seat for stability. L stand pivot EOB>w/c, no AD, with light min A for steadying.    Transported to/from gym in w/c for time management and energy conservation.  Gait training 36ft + 17ft + 176ft (seated breaks between) using R HHA progressed to no UE support with min assist for balance. Pt demonstrating the  following gait deviations with therapist providing the described cuing and facilitation for improvement:  - minor R anterior lean/LOB  - min increased postural sway - reciprocal stepping pattern - slight forward trunk flexed posture   Reports his neuropathy pain has pretty much dissipated and his feet don't hurt as much as they did 38mon-97yr ago when walking.  Noticed pt's clothes wet on back side, so transported back to room to change; however, pt doesn't have a change of clothes and elects to stay in his current clothing until his wife can bring him new clothes.  Pt left seated in w/c with needs in reach and seat belt alarm on.    Session 2: Pt received supine in bed awake but with lights off. Pt reports he is feeling bitter due to feeling like he is not getting any better and he is upset he is unable to be at home to support his wife while she is also not feeling well. Therapist providing emotional support and therapeutic listening. Therapist also educating pt on his progress thus far in only 3 days of ambulating 47ft with R UE support around therapist's shoulder at eval to 174ft in 1 walk today without UE support! Pt having a difficult time coping with his current situation and reports having too much time in his room to sit and think and be bored and lonely. Therapist recommended the peer support program and pt very excited about this stating he had a peer support during his breast cancer and found it very helpful. Therapist reached out to set this up  and pt appreciative. Pt adamantly declines participating in OOB mobility at this time so left supine in bed with needs in reach and bed alarm on. Missed 43 minutes of skilled physical therapy.  Therapy Documentation Precautions:  Precautions Precautions: Fall, Other (comment) Precaution Comments: Chronic vertigo; HOH Restrictions Weight Bearing Restrictions Per Provider Order: No   Pain:  Session 1: Reports his feet don't hurt as  much as they have previously. Does not impact participation in session today.   Session 2: No reports of pain.    Therapy/Group: Individual Therapy  Connell CHRISTELLA Kiss , PT, DPT, NCS, CSRS 04/26/2023, 7:57 AM

## 2023-04-26 NOTE — Progress Notes (Signed)
 Physical Therapy Session Note  Patient Details  Name: John [name] MRN: [id number] Date of Birth: 1945-11-20  Today's Date: 04/26/2023 PT Individual Time: 8954-8884 PT Individual Time Calculation (min): 30 min   Short Term Goals: Week 1:  PT Short Term Goal 1 (Week 1): Pt will perform sit<>stands using LRAD with CGA PT Short Term Goal 2 (Week 1): Pt will perform bed<>chair transfers using LRAD with CGA PT Short Term Goal 3 (Week 1): Pt will ambulate at least 144ft using LRAD with CGA PT Short Term Goal 4 (Week 1): Pt will navigate at least 8 steps using HRs with CGA  Skilled Therapeutic Interventions/Progress Updates:    Pt presents in w/c on phone call and defers start of session to complete phone call.   Pt presents in w/c stating he did a lot of walking this morning. Plan to focus on balance this session with use of Biodex. Pt performed min assist transfer and then min assist to step up onto the Biodex. Explaining the activity and pt reporting a headache and requesting to sit back down. Let him rest a min and then he states he just wouldn't be able to do it. Suggested other alternatives pt declined at first. Then suggested Nustep and demonstrated and pt agreeable. Min assist transfer without AD with cues for technique. Performed 5 min (attempted to stop after 3 min) when initial goal was 10 min. Pt just stating he didn't feel like he could do anymore. Performed the 5 min on level 4 for functional strengthening and cardiovascular endurance. Transferred back to bed at end of session with min assist ambulatory transfer and repositioned in supine with supervision. All needs in reach.   Therapy Documentation Precautions:  Precautions Precautions: Fall, Other (comment) Precaution Comments: Chronic vertigo; HOH Restrictions Weight Bearing Restrictions Per Provider Order: No  Pain: No pain at first. Pt then c/o headache during session and limited his ability to participate in  standing/balance and overall activity.    Therapy/Group: Individual Therapy  Elnor Pizza Sherrell Pizza WENDI Elnor, PT, DPT, CBIS  04/26/2023, 12:19 PM

## 2023-04-26 NOTE — Progress Notes (Signed)
 Patient ID: John [name], male   DOB: 1945-08-17, 78 y.o.   MRN: [id number] Have given pt the prescriptions for lift chair and stair lift signed by MD, so wife can pursue this with insurance

## 2023-04-26 NOTE — Progress Notes (Signed)
 Occupational Therapy Session Note  Patient Details  Name: John [name] MRN: [id number] Date of Birth: 1945-05-31  Today's Date: 04/26/2023 OT Individual Time: 8583-8493 OT Individual Time Calculation (min): 50 min  and Today's Date: 04/26/2023 OT Missed Time: 25 Minutes Missed Time Reason: Patient fatigue;Patient ill (comment) (Pt vomitted during session)   Short Term Goals: Week 1:  OT Short Term Goal 1 (Week 1): STGs=LTGs due patient's estimated length of stay.  Skilled Therapeutic Interventions/Progress Updates:  Pt received lightly sleeping in bed waking upon OT arrival. Pt presenting to be tired requiring mod motivation and therapeutic support to participate in skilled OT session reporting 0/10 pain, however reporting a mild headache- OT offering intermittent rest breaks, repositioning, and therapeutic support to optimize participation in therapy session. Pt transitioned to EOB using bed features with supervision with increased time provided sitting EOB d/t Pt reporting mild dizziness stating it feel like my head is woozy. Engaged Pt in lighthearted conversation to support moral with noted improvement. Pt requesting to take shower this session. Engaged Pt in completing functional mobility to bathroom with CGA provided for balance using RW. Provided seated rest break on toilet while water was warming up, however upon arriving to bathroom Pt reporting increased fatigue and requesting to postpone shower until later session. Pt receptive to going on a walk instead. Engaged Pt in functional mobility using RW for endurance training to increase independence and safety during BADLs. Pt able to ambulate ~50 ft CGA prior to requesting a seated rest break. Pt then reporting feeling nauseous and Pt began to vomit shortly after. Transported Pt back to room total A in wc. Pt reporting no other discomfort or symptoms. Assessed vitals seated in wc BP 106/71(82) HR 94 PSO2 100% and informed RN of Pt's  status with RN in/out during session to provide medications. Pt noted to have vomit on his clothes and was receptive to changing. Pt doff/donn shirt supervision and doff/donned pants with CGA provided for balance when standing to bring pants to waist using RW. Pt returned to bed with supervision.  Pt was left resting in bed with call bell in reach, bed alarm on, and all needs met.  Missed 25 minutes of skilled OT treatment d/t N/V. Will attempt to make up time as Pt's status and schedule allow.   Therapy Documentation Precautions:  Precautions Precautions: Fall, Other (comment) Precaution Comments: Chronic vertigo; HOH Restrictions Weight Bearing Restrictions Per Provider Order: No  Therapy/Group: Individual Therapy  [name] 04/26/2023, 3:54 PM

## 2023-04-26 NOTE — Plan of Care (Signed)
  Problem: Consults Goal: RH STROKE PATIENT EDUCATION Description: See Patient Education module for education specifics  Outcome: Progressing   Problem: RH BOWEL ELIMINATION Goal: RH STG MANAGE BOWEL WITH ASSISTANCE Description: STG Manage Bowel with toileting Assistance. Outcome: Progressing Goal: RH STG MANAGE BOWEL W/MEDICATION W/ASSISTANCE Description: STG Manage Bowel with Medication with mod I Assistance. Outcome: Progressing   Problem: RH BLADDER ELIMINATION Goal: RH STG MANAGE BLADDER WITH ASSISTANCE Description: STG Manage Bladder With toileting Assistance Outcome: Progressing Goal: RH STG MANAGE BLADDER WITH MEDICATION WITH ASSISTANCE Description: STG Manage Bladder With Medication With mod I Assistance. Outcome: Progressing   Problem: RH SAFETY Goal: RH STG ADHERE TO SAFETY PRECAUTIONS W/ASSISTANCE/DEVICE Description: STG Adhere to Safety Precautions With cues Assistance/Device. Outcome: Progressing   Problem: RH PAIN MANAGEMENT Goal: RH STG PAIN MANAGED AT OR BELOW PT'S PAIN GOAL Description: < 4 with prns Outcome: Progressing   Problem: RH KNOWLEDGE DEFICIT Goal: RH STG INCREASE KNOWLEDGE OF DIABETES Description: Patient and family will be able to manage DM using educational resources for medications and diet modification independently Outcome: Progressing Goal: RH STG INCREASE KNOWLEDGE OF HYPERTENSION Description: Patient and family will be able to manage HTN using educational resources for medications and diet modification independently Outcome: Progressing Goal: RH STG INCREASE KNOWLEGDE OF HYPERLIPIDEMIA Description: Patient and family will be able to manage HLD using educational resources for medications and diet modification independently Outcome: Progressing Goal: RH STG INCREASE KNOWLEDGE OF STROKE PROPHYLAXIS Description: Patient and family will be able to manage secondary risks using educational resources for medications and diet modification  independently Outcome: Progressing

## 2023-04-27 DIAGNOSIS — I10 Essential (primary) hypertension: Secondary | ICD-10-CM | POA: Diagnosis not present

## 2023-04-27 DIAGNOSIS — L89301 Pressure ulcer of unspecified buttock, stage 1: Secondary | ICD-10-CM | POA: Diagnosis not present

## 2023-04-27 DIAGNOSIS — I63511 Cerebral infarction due to unspecified occlusion or stenosis of right middle cerebral artery: Secondary | ICD-10-CM | POA: Diagnosis not present

## 2023-04-27 LAB — GLUCOSE, CAPILLARY
Glucose-Capillary: 166 mg/dL — ABNORMAL HIGH (ref 70–99)
Glucose-Capillary: 129 mg/dL — ABNORMAL HIGH (ref 70–99)
Glucose-Capillary: 182 mg/dL — ABNORMAL HIGH (ref 70–99)
Glucose-Capillary: 114 mg/dL — ABNORMAL HIGH (ref 70–99)

## 2023-04-27 NOTE — Progress Notes (Signed)
 Patient ID: John [name], male   DOB: 07-20-1945, 78 y.o.   MRN: [id number] Met with the patient to review current medical situation, rheab schedule, team conference and plan of care. Discussed secondary risk factors including HTN, HLD on Lipitor  and and DM  (A1C12.4). Patient noted he was concerned about having a stroke but was not interested in dietary modification recommendations. Given written information on stroke prevention, dietary modifications for heart healthy/CMM diet and medications. Reviewed medication for management including DAPT for 3 weeks then Plavix  solo.  Patient also noted bowel and bladder issues addressed.  He is anxious to go home and notes wife has discussed lift chair and stair lift with SW.  Continue to follow along to address educational needs to facilitate preparation for discharge. Fredericka Barnie NOVAK

## 2023-04-27 NOTE — Progress Notes (Signed)
 Patient ID: John [name], male   DOB: [birth year], 78 y.o.   MRN: [id number] Met with pt to give him the team conference update regarding goals of mod/I-supervision level and target discharge date of 1/23. He hopes he is ready, his wife is sicker and he feels she may have the flu. He is somewhat down and will have peer support and neuro-psych to see. He is tired of getting sick. He hopes the binder will help, since he wants his BP so he stops falling. Will continue to work on discharge plans.

## 2023-04-27 NOTE — Progress Notes (Signed)
 Occupational Therapy Session Note  Patient Details  Name: Kamarion Cicale MRN: [id number] Date of Birth: Jan 17, 1946  Today's Date: 04/27/2023 OT Individual Time: 4098-1191 OT Individual Time Calculation (min): 68 min    Short Term Goals: Week 1:  OT Short Term Goal 1 (Week 1): STGs=LTGs due patient's estimated length of stay.  Skilled Therapeutic Interventions/Progress Updates:     Pt received sitting up in bed presenting with flat affect and to be down upon OT arrival. Engaged Pt light hearted conversation to support improved moral and participation with noted improvement. Pt receptive to skilled OT session with mod motivation reporting 0/10 pain- OT offering intermittent rest breaks, repositioning, and therapeutic support to optimize participation in therapy session. Focus this session Pt education, IADL retraining, activity tolerance, and dynamic balance. Pt dressed and ready for the day upon OT arrival with thigh high TEDs donned politely declining need for shower this session.   Transported Pt <> therapy spaces this session in wc for energy conservation and to support moral/participation.   Engaged Pt in simulated IADL laundry tasks during session to address safety awareness, functional cognition, and activity tolerance deficits. Pt able to to retrieve items from wash and transport them to drier using RW with min A provided for standing balance d/t Pt presenting with moderate posterior lean and unable to correct with verbal/tactile cues. Mod verbal cues required for safety with Pt presenting with increased challenges problem solving positioning with RW. When returning to wc following activity, Pt presenting with significant safety awareness deficits- attempting to sit in wc prior to being positioned with back of legs at wc and trying to holding onto hallway rail with one hand and opposite hand on RW. Education provided on safety and positioning using RW and ensuring back of legs are against  chair prior to sitting with Pt receptive to education.   In therapy gym, engaged Pt in dynamic standing balance horse shoe toss game to work on standing balance and activity tolerance Pt able to complete 8 reps x2 trials using RW and min A for balance d/t posterior lean- mod verbal cues required to avoid Pt resting posterior portion of legs against mat. Vitals prior to standing: BP 105/62 (75) HR 95 Vitals following standing task: BP 83/59 (63) HR 98 Vitals following 2 minutes seated break: 109/66 (78) HR 95 Education provided on orthostatic hypotension with focus on management and symptom with Pt receptive to education. Encouraged fluid intake throughout session.   Pt completed short distance functional mobility to NuStep using RW with min A provided for balance. Engaged Pt in completing B U/LE endurance ans strength training on NuStep to increase activity tolerance for BADLs. Pt able to tolerate completing 3 minutes on level 5 setting with prolonged seated rest break provided following.   Pt presenting to be extremely down during session with maximal therapeutic support provided, however only min intermittent improvement. Pt requesting to return to bed at end of session. EOB>supine using bed rail supervision. Pt was left resting in bed with call bell in reach, bed alarm on, and all needs met.    Therapy Documentation Precautions:  Precautions Precautions: Fall, Other (comment) Precaution Comments: Chronic vertigo; HOH Restrictions Weight Bearing Restrictions Per Provider Order: No  Therapy/Group: Individual Therapy  Geoffery Kiel 04/27/2023, 7:52 AM

## 2023-04-27 NOTE — Progress Notes (Signed)
 Physical Therapy Session Note  Patient Details  Name: John [name] MRN: [id number] Date of Birth: 12/12/1945  Today's Date: 04/27/2023 PT Individual Time: 0920-1030 PT Individual Time Calculation (min): 70 min   Short Term Goals: Week 1:  PT Short Term Goal 1 (Week 1): Pt will perform sit<>stands using LRAD with CGA PT Short Term Goal 2 (Week 1): Pt will perform bed<>chair transfers using LRAD with CGA PT Short Term Goal 3 (Week 1): Pt will ambulate at least 142ft using LRAD with CGA PT Short Term Goal 4 (Week 1): Pt will navigate at least 8 steps using HRs with CGA  Skilled Therapeutic Interventions/Progress Updates:    Pt received awake, supine in bed reporting he is having a terrible morning because his wife is at home very sick and she doesn't have anyone to care for her. Pt emotional and reporting he doesn't have any clean clothes and is upset he cannot get a hold of his son in [place] to help his wife. Therapist provides emotional support and encourages pt to participate in washing his clothes to prepare for doing that at home, pt in agreement to therapy session starting with this.   Supine>sitting R EOB, HOB partially elevated and using bedrail, with SBA/CGA and increased time/effort.  Once sitting EOB, pt with 2x posterior LOB with inability to utilize balance strategy to maintain upright, min A to return to upright and once scoots hips closer to EOB improves midline upright trunk control to supervision level .  Vitals throughout session:  - sitting EOB: BP 126/68 (MAP 84), HR 91bpm  - standing: BP 97/63 (MAP 73), HR 98bpm with pt reporting feeling "a little dizzy" **Retrieved thigh high TED hose and donned them in supine.   Reassessed vitals wearing thigh high TEDs:  - supine: BP 138/74 (MAP 93), HR 84bpm  - sitting EOB: BP 121/73 (MAP 86), HR 86bpm  - standing: BP 88/48 (MAP 61), HR 96bpm with pt continuing to report dizziness while standing but having a hard time describing  his symptoms  Encouraged increased water intake during session.  Pt also reports headache pain at this time, but states his headache is pretty constant.   Transported to laundry room in w/c. Sit>stand to place laundry in machine with min A. Standing with heavy min A due to strong R lean while putting clothes in washing machine.  Transported to main therapy gym.  Vitals sitting in w/c to start in gym: BP 115/69 (MAP 82), HR 90bpm  Vitals in sitting after gait 25ft: BP 124/67 (MAP 84), HR 97bpm  Vitals in sitting after gait 153ft: BP 129/60 (MAP 80), HR 95bpm   Gait training ~71ft using R HHA with consistent skilled min A due to posterior lean tendency and listing towards R with poor L weight shift onto L stance phase causing quick, steps forward with R LE. Gait training additional ~161ft using R HHA with lighter skilled min A for balance as described above - pt suddenly starting to have urgency for BM.  Transported back to room in w/c. Stand pivot w/c>BSC over toilet with CGA/light min A and using UE support on grab bar. Standing with light min A during LB clothing management. Continent of loose stool and performed peri-care without assist. Set-up assist hand hygiene.  Gait training to recliner using R HHA with min A for balance and heavier min A when navigating down threshold out of bathroom due to LOB. Pt left seated in recliner with needs in reach and seat  belt alarm on.  Pt very solemn and gloomy throughout session - therapist making attempts to provide emotional support and encouragement on his progress and CLOF during session. Team received message for peer support.  MD made aware of pt's orthostatic hypotension with sit>stand during team conference.      Therapy Documentation Precautions:  Precautions Precautions: Fall, Other (comment) Precaution Comments: Chronic vertigo; HOH Restrictions Weight Bearing Restrictions Per Provider Order: No   Pain:  Reports headache pain,  unrated, but states it is pretty consistent.     Therapy/Group: Individual Therapy  Rhett Cella , PT, DPT, NCS, CSRS 04/27/2023, 7:46 AM

## 2023-04-27 NOTE — Patient Care Conference (Signed)
Inpatient RehabilitationTeam Conference and Plan of Care Update Date: 04/27/2023   Time: 1055    Patient Name: John [name]      Medical Record Number: [id number]  Date of Birth: 02-11-1946 Sex: Male         Room/Bed: 4W23C/4W23C-01 Payor Info: Payor: [employer]ICARE / Plan: [employer] MEDICARE / Product Type: *No Product type* /    Admit Date/Time:  04/22/2023  3:26 PM  Primary Diagnosis:  Brainstem infarct, acute [facility])  Hospital Problems: Principal Problem:   Brainstem infarct, acute (HCC) Active Problems:   Right middle cerebral artery stroke (HCC)   Pressure injury of skin    Expected Discharge Date: Expected Discharge Date: 05/05/23  Team Members Present: Physician leading conference: Dr. Claudette Laws Social Worker Present: Dossie Der, LCSW Nurse Present: Chana Bode, RN;Karen Trilby Drummer, RN PT Present: Casimiro Needle, PT OT Present: Mariann Barter, OT SLP Present: Everardo Pacific, SLP PPS Coordinator present : Edson Snowball, PT     Current Status/Progress Goal Weekly Team Focus  Bowel/Bladder   Continent bof B/B. LBM 04/26/2023   Maintain continence   Assist with BR needs    Swallow/Nutrition/ Hydration               ADL's   UB superivsion, LB CGA-min A, toileting min A using RW, shower/toilet transfers CGA using RW   mod I   Activity tolerance, Pt/family education, functional transfer training, general strengthening, BADL retraining    Mobility   supervision bed mobility, min A STS and stand pivot transfers, min A gait training up to 18ft with RHHA vs RW, min A 4 stairs using B HRs - has missed a good amount of therapy thus far due to decreased activity tolerance   supervision overall ambulatory level  activity tolerance, pt education, transfer training, gait training, dynamic standing balance - changed POC to 15/7 due to pt's significant endurance impairments impacting participation in therapy - pt would benefit from  emotional support and referred to peer support program - his wife with limited ability to support pt at D/C    Communication                Safety/Cognition/ Behavioral Observations               Pain   Denies pain   Remain pain free   Assess Q4 and prn    Skin   Intact   Maintain integrity  Assess QS and prn      Discharge Planning:  HOme with wife who can provide supervision level, currently she is sick. with his hx of falls wife wants lift chair and stair lift gave prescriptions to pt   Team Discussion: Patient was admitted post Right CVA with poorly controlled DM and limited orthostatic hypotension.  Patient on target to meet rehab goals: yes, currently supervision for upper body ; CGA for lower body care and min assist with toileting.Currently min Assist for ambulating with walker. Goal for discharge set for supervision over all.   *See Care Plan and progress notes for long and short-term goals.   Revisions to Treatment Plan:  15/7 therapy Neuropsych eval   Teaching Needs: Safety, Medications, transfers , toileting, diet modifications, etc.  Current Barriers to Discharge: Home enviroment access/layout  Possible Resolutions to Barriers: Family education Stairlift and lift chair script  Home health follow up    Medical Summary Current Status: poor tolerance for therapy , BP moderately labile  Possible Resolutions to [employer]: adjust therapy schedule to 15/7 , adjust BP meds as needed   Continued Need for Acute Rehabilitation Level of Care: The patient requires daily medical management by a physician with specialized training in physical medicine and rehabilitation for the following reasons: Direction of a multidisciplinary physical rehabilitation program to maximize functional independence : Yes Medical management of patient stability for increased activity during participation in an intensive rehabilitation regime.: Yes Analysis of  laboratory values and/or radiology reports with any subsequent need for medication adjustment and/or medical intervention. : Yes   I attest that I was present, lead the team conference, and concur with the assessment and plan of the team.   Gwenyth Allegra 04/27/2023, 4:16 PM

## 2023-04-27 NOTE — Progress Notes (Signed)
 PROGRESS NOTE   Subjective/Complaints:  On 15/7 schedule due to poor tolerance,  very inactive at home , stayed in recliner and slept there   ROS: as per HPI. Denies CP, SOB, abd pain, N/V/D/C, or any other complaints at this time.      Objective:   No results found. Recent Labs    04/25/23 0522  WBC 8.3  HGB 12.6*  HCT 37.9*  PLT 209   Recent Labs    04/25/23 0522  NA 132*  K 4.3  CL 101  CO2 22  GLUCOSE 206*  BUN 16  CREATININE 1.19  CALCIUM  9.3    Intake/Output Summary (Last 24 hours) at 04/27/2023 0842 Last data filed at 04/26/2023 1900 Gross per 24 hour  Intake 472 ml  Output --  Net 472 ml     Pressure Injury 04/22/23 Buttocks Right;Left Stage 1 -  Intact skin with non-blanchable redness of a localized area usually over a bony prominence. Pink area on bilateral buttocks (Active)  04/22/23 1530  Location: Buttocks  Location Orientation: Right;Left  Staging: Stage 1 -  Intact skin with non-blanchable redness of a localized area usually over a bony prominence.  Wound Description (Comments): Pink area on bilateral buttocks  Present on Admission: Yes    Physical Exam: Vital Signs Blood pressure (!) 149/77, pulse 90, temperature 98.7 F (37.1 C), resp. rate 18, height 6' (1.829 m), weight 101.9 kg, SpO2 95%.    Physical Exam  General: No acute distress Mood and affect are appropriate Heart: Regular rate and rhythm no rubs murmurs or extra sounds Chest - s/p right masectomy  Lungs: Clear to auscultation, breathing unlabored, no rales or wheezes Abdomen: Positive bowel sounds, soft nontender to palpation, nondistended Extremities: No clubbing, cyanosis, or edema Skin: No evidence of breakdown, no evidence of rash Neurologic: Cranial nerves II through XII intact, motor strength is 5/5 in bilateral deltoid, bicep, tricep, grip, hip flexor, knee extensors, ankle dorsiflexor and plantar  flexor  Cerebellar exam normal finger to nose to finger as well as heel to shin in bilateral upper and lower extremities Musculoskeletal: Full range of motion in all 4 extremities. No joint swelling    Assessment/Plan: 1. Functional deficits which require 3+ hours per day of interdisciplinary therapy in a comprehensive inpatient rehab setting. Physiatrist is providing close team supervision and 24 hour management of active medical problems listed below. Physiatrist and rehab team continue to assess barriers to discharge/monitor patient progress toward functional and medical goals  Care Tool:  Bathing    Body parts bathed by patient: Right arm, Left arm, Chest, Abdomen, Front perineal area, Right upper leg, Left upper leg, Face, Buttocks, Left lower leg, Right lower leg   Body parts bathed by helper: Buttocks, Left lower leg, Right lower leg     Bathing assist Assist Level: Minimal Assistance - Patient > 75%     Upper Body Dressing/Undressing Upper body dressing   What is the patient wearing?: Pull over shirt    Upper body assist Assist Level: Supervision/Verbal cueing    Lower Body Dressing/Undressing Lower body dressing      What is the patient wearing?: Pants  Lower body assist Assist for lower body dressing: Minimal Assistance - Patient > 75%     Toileting Toileting Toileting Activity did not occur Press photographer and hygiene only): N/A (no void or bm)  Toileting assist Assist for toileting: Minimal Assistance - Patient > 75%     Transfers Chair/bed transfer  Transfers assist     Chair/bed transfer assist level: Minimal Assistance - Patient > 75%     Locomotion Ambulation   Ambulation assist      Assist level: Minimal Assistance - Patient > 75% Assistive device: Walker-rolling Max distance: 150   Walk 10 feet activity   Assist     Assist level: Minimal Assistance - Patient > 75% Assistive device: Walker-rolling   Walk 50 feet  activity   Assist    Assist level: Minimal Assistance - Patient > 75% Assistive device: Walker-rolling    Walk 150 feet activity   Assist Walk 150 feet activity did not occur: Safety/medical concerns  Assist level: Minimal Assistance - Patient > 75% Assistive device: Walker-rolling    Walk 10 feet on uneven surface  activity   Assist Walk 10 feet on uneven surfaces activity did not occur: Safety/medical concerns         Wheelchair     Assist Is the patient using a wheelchair?: Yes (for transport b/c of impaired activity tolerance limiting gait distance) Type of Wheelchair: Manual    Wheelchair assist level: Dependent - Patient 0%      Wheelchair 50 feet with 2 turns activity    Assist        Assist Level: Dependent - Patient 0%   Wheelchair 150 feet activity     Assist      Assist Level: Dependent - Patient 0%   Blood pressure (!) 149/77, pulse 90, temperature 98.7 F (37.1 C), resp. rate 18, height 6' (1.829 m), weight 101.9 kg, SpO2 95%.  Medical Problem List and Plan: 1. 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  Antithrombotics: -DVT/anticoagulation:  Mechanical: Antiembolism stockings, thigh (TED hose) Bilateral lower extremities -antiplatelet therapy: Aspirin  81 mg daily and Plavix  75 mg daily x 3 weeks then Plavix  alone 3. Pain Management: Tylenol  as needed 4. Mood/Behavior/Sleep: Not emotional support             -antipsychotic agents: N/A 5. Neuropsych/cognition: This patient is capable of making decisions on his own behalf. 6. Skin/Wound Care: Routine skin checks 7. Fluids/Electrolytes/Nutrition: Routine in and outs with follow-up chemistries 8.  Hypertension.  Norvasc  5 mg daily.  Monitor with increased mobility  -1/11 intermittently  elevated continue to monitor -04/24/23 BPs fine, cont regimen; of note, pharmacy stated he was on furosemide  80mg  daily but down to 40mg  daily on inpatient, but not resumed here   Vitals:   04/24/23 0731 04/24/23 1708 04/24/23 1946 04/25/23 0507  BP: 127/74 (!) 142/65 (!) 141/60 (!) 154/82   04/25/23 1626 04/25/23 1934 04/26/23 0457 04/26/23 0826  BP: (!) 144/72 133/62 (!) 160/75 (!) 146/71   04/26/23 1323 04/26/23 1343 04/26/23 2005 04/27/23 0446  BP: (!) 142/92 127/68 137/65 (!) 149/77   Increase amlodipine  to 10mg  on 1/14 monitor  - overall improved ,  mildly elevated systolic this am    9.  Diabetes mellitus peripheral neuropathy.  Latest hemoglobin A1c 12.3.  Currently on NovoLog  70/30 22 units twice daily.  Diabetic teaching.  Patient's home regimen of Glucophage  1000 mg twice daily as well as Toujeo  15 units in the morning and 70 units in the evening-- currently held.  Resume as needed 1/11 CBGs mildly elevated, restart metformin  XR at 500 mg dose, continue current dose of 70/30 for now -04/24/23 CBGs in the 200s, recent med adjustment, monitor for trend with that change, but suspect he'll likely need further adjustments CBG (last 3)  Recent Labs    04/26/23 1635 04/26/23 2058 04/27/23 0622  GLUCAP 186* 158* 166*   Increase novalog 70/30 to 22U BID first dose 1/13 pm , improved CBG this am , may be able to d/c metformin   10.  Chronic vertigo.  Continue Antivert  12.5 mg 3 times daily prn.  Patient followed outpatient by ENT   11.  BPH.  Proscar  5 mg daily, Flomax  0.4 mg daily.  Check PVR.  Followed by urology Dr. Andi Banas [facility]  -1/11 continent continue current regimen  -04/24/23 no PVRs documented but continent, cont regimen   12.  History of breast cancer.  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  Status post modified radical mastectomy and chemotherapy.  Patient on tamoxifen  20mg  daily since April 2020 follow-up with outpatient oncology Dr Nicolas Barren Vallathucherry and resume  tamoxifen  as outpatient.   13.  GERD.  Protonix  40mg  daily   14.  AKI on CKD.  Creatinine baseline 1.29-1.64.  Follow-up chemistries   15.  Obesity.  BMI 30.45.  Dietary follow-up  16. Constipation: on miralax  BID- large cont liquid BM this am  -04/24/23 reports LBM was yesterday or two days ago, no documentation of BMs on file-- also refusing miralax ; if no BM by tomorrow, would consider encouraging pt to use miralax , or using alternative bowel regimen that he'd be willing to use.       LOS: 5 days A FACE TO FACE EVALUATION WAS PERFORMED  Genetta Kenning 04/27/2023, 8:42 AM

## 2023-04-28 ENCOUNTER — Inpatient Hospital Stay (HOSPITAL_COMMUNITY): Payer: Medicare Other

## 2023-04-28 DIAGNOSIS — N189 Chronic kidney disease, unspecified: Secondary | ICD-10-CM

## 2023-04-28 DIAGNOSIS — N179 Acute kidney failure, unspecified: Secondary | ICD-10-CM

## 2023-04-28 DIAGNOSIS — R052 Subacute cough: Secondary | ICD-10-CM | POA: Diagnosis not present

## 2023-04-28 DIAGNOSIS — I63511 Cerebral infarction due to unspecified occlusion or stenosis of right middle cerebral artery: Secondary | ICD-10-CM | POA: Diagnosis not present

## 2023-04-28 DIAGNOSIS — I10 Essential (primary) hypertension: Secondary | ICD-10-CM | POA: Diagnosis not present

## 2023-04-28 DIAGNOSIS — E1169 Type 2 diabetes mellitus with other specified complication: Secondary | ICD-10-CM | POA: Diagnosis not present

## 2023-04-28 LAB — COMPREHENSIVE METABOLIC PANEL
ALT: 58 U/L — ABNORMAL HIGH (ref 0–44)
AST: 53 U/L — ABNORMAL HIGH (ref 15–41)
Albumin: 2.9 g/dL — ABNORMAL LOW (ref 3.5–5.0)
Alkaline Phosphatase: 51 U/L (ref 38–126)
Anion gap: 13 (ref 5–15)
BUN: 13 mg/dL (ref 8–23)
CO2: 20 mmol/L — ABNORMAL LOW (ref 22–32)
Calcium: 9.1 mg/dL (ref 8.9–10.3)
Chloride: 100 mmol/L (ref 98–111)
Creatinine, Ser: 1.24 mg/dL (ref 0.61–1.24)
GFR, Estimated: >60 mL/min (ref >60)
Glucose, Bld: 164 mg/dL — ABNORMAL HIGH (ref 70–99)
Potassium: 4.2 mmol/L (ref 3.5–5.1)
Sodium: 133 mmol/L — ABNORMAL LOW (ref 135–145)
Total Bilirubin: 1.1 mg/dL (ref 0.0–1.2)
Total Protein: 5.9 g/dL — ABNORMAL LOW (ref 6.5–8.1)

## 2023-04-28 LAB — GLUCOSE, CAPILLARY
Glucose-Capillary: 129 mg/dL — ABNORMAL HIGH (ref 70–99)
Glucose-Capillary: 169 mg/dL — ABNORMAL HIGH (ref 70–99)
Glucose-Capillary: 150 mg/dL — ABNORMAL HIGH (ref 70–99)
Glucose-Capillary: 96 mg/dL (ref 70–99)

## 2023-04-28 LAB — CBC WITH DIFFERENTIAL/PLATELET
Abs Immature Granulocytes: 0.04 10*3/uL (ref 0.00–0.07)
Basophils Absolute: 0.1 10*3/uL (ref 0.0–0.1)
Basophils Relative: 1 %
Eosinophils Absolute: 0.1 10*3/uL (ref 0.0–0.5)
Eosinophils Relative: 2 %
HCT: 37.7 % — ABNORMAL LOW (ref 39.0–52.0)
Hemoglobin: 12.3 g/dL — ABNORMAL LOW (ref 13.0–17.0)
Immature Granulocytes: 1 %
Lymphocytes Relative: 14 %
Lymphs Abs: 1.3 10*3/uL (ref 0.7–4.0)
MCH: 27.2 pg (ref 26.0–34.0)
MCHC: 32.6 g/dL (ref 30.0–36.0)
MCV: 83.4 fL (ref 80.0–100.0)
Monocytes Absolute: 0.8 10*3/uL (ref 0.1–1.0)
Monocytes Relative: 9 %
Neutro Abs: 6.5 10*3/uL (ref 1.7–7.7)
Neutrophils Relative %: 73 %
Platelets: 259 10*3/uL (ref 150–400)
RBC: 4.52 MIL/uL (ref 4.22–5.81)
RDW: 15.4 % (ref 11.5–15.5)
WBC: 8.8 10*3/uL (ref 4.0–10.5)
nRBC: 0 % (ref 0.0–0.2)

## 2023-04-28 MED ORDER — MENTHOL 3 MG MT LOZG
1.0000 | LOZENGE | OROMUCOSAL | Status: DC | PRN
  Administered 2023-04-28 – 2023-05-07 (×13): 3 mg via ORAL
  Filled 2023-04-28 (×3): qty 9

## 2023-04-28 NOTE — Progress Notes (Signed)
Physical Therapy Session Note  Patient Details  Name: John [name] MRN: [id number] Date of Birth: May 20, 1945  Today's Date: 04/28/2023 PT Missed Time: 45 Minutes Missed Time Reason: Patient fatigue;Patient unwilling to participate  Pt received supine in bed resting and upon inquiring about his day reports he "hasn't felt good" and when questioning for further explanation pt states "everything" feels bad. Therapist attempted to motivate and encourage patient to participate based on his success in earlier PT session and current estimated D/C 1 week from today; however, pt continues to seem very down and sad.  Pt appearing sad and defeated states "I honestly don't feel like doing a damn thing." Therapist provides emotional support and encouragement on continued increased participation tomorrow. Pt left supine in bed with needs in reach and bed alarm on.  Ginny Forth , PT, DPT, NCS, CSRS 04/28/2023, 1:01 PM

## 2023-04-28 NOTE — Progress Notes (Addendum)
PROGRESS NOTE   Subjective/Complaints:  Patient reports he has had a cough for over a week.  Has not particularly gotten worse but is persisting. Reports HA yesterday that resolved today.   ROS: as per HPI. Denies CP, SOB, abd pain, N/V/D/C, or any other complaints at this time.    HA -improved + cough  Objective:   No results found. No results for input(s): "WBC", "HGB", "HCT", "PLT" in the last 72 hours.  No results for input(s): "NA", "K", "CL", "CO2", "GLUCOSE", "BUN", "CREATININE", "CALCIUM" in the last 72 hours.   Intake/Output Summary (Last 24 hours) at 04/28/2023 1526 Last data filed at 04/28/2023 1323 Gross per 24 hour  Intake 720 ml  Output 2 ml  Net 718 ml         Physical Exam: Vital Signs Blood pressure (!) 152/68, pulse 85, temperature 99.2 F (37.3 C), temperature source Oral, resp. rate 18, height 6' (1.829 m), weight 101.9 kg, SpO2 94%.    Physical Exam  General: No acute distress, laying in bed Mood and affect are appropriate Heart: Regular rate and rhythm no rubs murmurs or extra sounds Chest - s/p right masectomy  Lungs: Clear to auscultation, breathing unlabored, no rales or wheezes, on RA Abdomen: Positive bowel sounds, soft nontender to palpation, nondistended Extremities: No clubbing, cyanosis, or edema Skin: No evidence of breakdown, no evidence of rash Neurologic: Cranial nerves II through XII intact, motor strength is 5/5 in bilateral deltoid, bicep, tricep, grip, hip flexor, knee extensors, ankle dorsiflexor and plantar flexor  Cerebellar exam normal finger to nose to finger as well as heel to shin in bilateral upper and lower extremities  Exam Stable 1/16 Musculoskeletal: Full range of motion in all 4 extremities. No joint swelling    Assessment/Plan: 1. Functional deficits which require 3+ hours per day of interdisciplinary therapy in a comprehensive inpatient rehab  setting. Physiatrist is providing close team supervision and 24 hour management of active medical problems listed below. Physiatrist and rehab team continue to assess barriers to discharge/monitor patient progress toward functional and medical goals  Care Tool:  Bathing    Body parts bathed by patient: Right arm, Left arm, Chest, Abdomen, Front perineal area, Right upper leg, Left upper leg, Face, Buttocks, Left lower leg, Right lower leg   Body parts bathed by helper: Buttocks, Left lower leg, Right lower leg     Bathing assist Assist Level: Minimal Assistance - Patient > 75%     Upper Body Dressing/Undressing Upper body dressing   What is the patient wearing?: Pull over shirt    Upper body assist Assist Level: Supervision/Verbal cueing    Lower Body Dressing/Undressing Lower body dressing      What is the patient wearing?: Pants     Lower body assist Assist for lower body dressing: Minimal Assistance - Patient > 75%     Toileting Toileting Toileting Activity did not occur (Clothing management and hygiene only): N/A (no void or bm)  Toileting assist Assist for toileting: Minimal Assistance - Patient > 75%     Transfers Chair/bed transfer  Transfers assist     Chair/bed transfer assist level: Minimal Assistance - Patient > 75%  Locomotion Ambulation   Ambulation assist      Assist level: Minimal Assistance - Patient > 75% Assistive device: Walker-rolling Max distance: 150   Walk 10 feet activity   Assist     Assist level: Minimal Assistance - Patient > 75% Assistive device: Walker-rolling   Walk 50 feet activity   Assist    Assist level: Minimal Assistance - Patient > 75% Assistive device: Walker-rolling    Walk 150 feet activity   Assist Walk 150 feet activity did not occur: Safety/medical concerns  Assist level: Minimal Assistance - Patient > 75% Assistive device: Walker-rolling    Walk 10 feet on uneven surface   activity   Assist Walk 10 feet on uneven surfaces activity did not occur: Safety/medical concerns         Wheelchair     Assist Is the patient using a wheelchair?: Yes (for transport b/c of impaired activity tolerance limiting gait distance) Type of Wheelchair: Manual    Wheelchair assist level: Dependent - Patient 0%      Wheelchair 50 feet with 2 turns activity    Assist        Assist Level: Dependent - Patient 0%   Wheelchair 150 feet activity     Assist      Assist Level: Dependent - Patient 0%   Blood pressure (!) 152/68, pulse 85, temperature 99.2 F (37.3 C), temperature source Oral, resp. rate 18, height 6' (1.829 m), weight 101.9 kg, SpO2 94%.  Medical Problem List and Plan: 1. 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  Antithrombotics: -DVT/anticoagulation:  Mechanical: Antiembolism stockings, thigh (TED hose) Bilateral lower extremities -antiplatelet therapy: Aspirin 81 mg daily and Plavix 75 mg daily x 3 weeks then Plavix alone 3. Pain Management: Tylenol as needed 4. Mood/Behavior/Sleep: Not emotional support             -antipsychotic agents: N/A 5. Neuropsych/cognition: This patient is capable of making decisions on his own behalf. 6. Skin/Wound Care: Routine skin checks 7. Fluids/Electrolytes/Nutrition: Routine in and outs with follow-up chemistries 8.  Hypertension.  Norvasc 5 mg daily.  Monitor with increased mobility  -1/11 intermittently elevated continue to monitor -04/24/23 BPs fine, cont regimen; of note, pharmacy stated he was on furosemide 80mg  daily but down to 40mg  daily on inpatient, but not resumed here    Vitals:   04/26/23 0457 04/26/23 0826 04/26/23 1323 04/26/23 1343  BP: (!) 160/75 (!) 146/71 (!) 142/92 127/68    04/26/23 2005 04/27/23 0446 04/27/23 1413 04/27/23 2025  BP: 137/65 (!) 149/77 (!) 140/62 132/65   04/28/23 0439 04/28/23 0822 04/28/23 1420 04/28/23 1518  BP: (!) 156/66 (!) 124/52 134/71 (!) 152/68   Increase amlodipine to 10mg  on 1/14 monitor  - overall improved ,mildly elevated systolic this am   -1/16 BP stable overall, continue current    9.  Diabetes mellitus peripheral neuropathy.  Latest hemoglobin A1c 12.3.  Currently on NovoLog 70/30 22 units twice daily.  Diabetic teaching.  Patient's home regimen of Glucophage 1000 mg twice daily as well as Toujeo 15 units in the morning and 70 units in the evening-- currently held.  Resume  as needed 1/11 CBGs mildly elevated, restart metformin XR at 500 mg dose, continue current dose of 70/30 for now -04/24/23 CBGs in the 200s, recent med adjustment, monitor for trend with that change, but suspect he'll likely need further adjustments CBG (last 3)  Recent Labs    04/27/23 2025 04/28/23 0621 04/28/23 1146  GLUCAP 114* 129* 169*   Increase novalog 70/30 to 22U BID first dose 1/13 pm , improved CBG this am , may be able to d/c metformin   1/16 CBGs improved, continue current regimen 10.  Chronic vertigo.  Continue Antivert 12.5 mg 3 times daily prn.  Patient followed outpatient by ENT   11.  BPH.  Proscar 5 mg daily, Flomax 0.4 mg daily.  Check PVR.  Followed by urology Dr. Tobie Lords [facility]  -1/11 continent continue current regimen  -04/24/23 no PVRs documented but continent, cont regimen   12.  History of breast cancer.  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  Status post modified radical mastectomy and chemotherapy.  Patient on tamoxifen 20mg  daily since April 2020 follow-up with outpatient oncology Dr Allison Quarry Vallathucherry and resume tamoxifen as outpatient.   13.  GERD.  Protonix 40mg  daily   14.  AKI on CKD.  Creatinine baseline 1.29-1.64.  Follow-up chemistries  -Recheck labs today   15.  Obesity.  BMI 30.45.  Dietary  follow-up  16. Constipation: on miralax BID- large cont liquid BM this am  -04/24/23 reports LBM was yesterday or two days ago, no documentation of BMs on file-- also refusing miralax; if no BM by tomorrow, would consider encouraging pt to use miralax, or using alternative bowel regimen that he'd be willing to use.  -1/16 LBM today  17. Cough  -CXR ordered  -CBC/BMP      LOS: 6 days A FACE TO FACE EVALUATION WAS PERFORMED  Fanny Dance 04/28/2023, 3:26 PM

## 2023-04-28 NOTE — Progress Notes (Signed)
Occupational Therapy Session Note  Patient Details  Name: Julian Male MRN: [id number] Date of Birth: [birth year]  Today's Date: 04/28/2023 OT Individual Time: 1300-1340 OT Individual Time Calculation (min): 40 min    Short Term Goals: Week 1:  OT Short Term Goal 1 (Week 1): STGs=LTGs due patient's estimated length of stay.  Skilled Therapeutic Interventions/Progress Updates:    OT intervention with focus on functional amb with RW, standing balance, and therex for general conditioning and strength. Sit<>stand from EOB with supervision. No dizziness reported. Amb with RW to/from day room with CGA. NuStep 5 mins level 4 with extended rest break following task. Pt returned to bed. All needs within rach. Bed alarm activated.   Therapy Documentation Precautions:  Precautions Precautions: Fall, Other (comment) Precaution Comments: Chronic vertigo; HOH Restrictions Weight Bearing Restrictions Per Provider Order: No   Pain:  Pt denies pain this afternoon.   Therapy/Group: Individual Therapy  Rich Brave 04/28/2023, 1:47 PM

## 2023-04-28 NOTE — Progress Notes (Addendum)
Patient ID: John [name], male   DOB: [birth year], 78 y.o.   MRN: [id number] Left message for wife who is ill at home, regarding team conference update from yesterday. Will await return call  11:57 Am Spoke with wife who has the flu at home and went over the team conference update. She does want to talk to the MD due to had no idea he had BP issues. Will message MD to call wife.  12:25 PM Pt signed consent for peer support have let kelly and Carollee Herter know about this and will move forward regarding peer support call

## 2023-04-28 NOTE — Plan of Care (Signed)
Patient had a quite night. BM this morning. Denies pain. Safety maintained. Problem: Consults Goal: RH STROKE PATIENT EDUCATION Description: See Patient Education module for education specifics  Outcome: Progressing   Problem: RH BOWEL ELIMINATION Goal: RH STG MANAGE BOWEL WITH ASSISTANCE Description: STG Manage Bowel with toileting Assistance. Outcome: Progressing   Problem: RH BLADDER ELIMINATION Goal: RH STG MANAGE BLADDER WITH ASSISTANCE Description: STG Manage Bladder With toileting Assistance Outcome: Progressing   Problem: RH SAFETY Goal: RH STG ADHERE TO SAFETY PRECAUTIONS W/ASSISTANCE/DEVICE Description: STG Adhere to Safety Precautions With cues Assistance/Device. Outcome: Progressing   Problem: RH KNOWLEDGE DEFICIT Goal: RH STG INCREASE KNOWLEDGE OF DIABETES Description: Patient and family will be able to manage DM using educational resources for medications and diet modification independently Outcome: Progressing Goal: RH STG INCREASE KNOWLEDGE OF HYPERTENSION Description: Patient and family will be able to manage HTN using educational resources for medications and diet modification independently Outcome: Progressing Goal: RH STG INCREASE KNOWLEGDE OF HYPERLIPIDEMIA Description: Patient and family will be able to manage HLD using educational resources for medications and diet modification independently Outcome: Progressing Goal: RH STG INCREASE KNOWLEDGE OF STROKE PROPHYLAXIS Description: Patient and family will be able to manage secondary risks using educational resources for medications and diet modification independently Outcome: Progressing

## 2023-04-28 NOTE — Progress Notes (Signed)
Physical Therapy Session Note  Patient Details  Name: John [name] MRN: [id number] Date of Birth: 12-Jun-1945  Today's Date: 04/28/2023 PT Individual Time: 0900-0950 PT Individual Time Calculation (min): 50 min  and Today's Date: 04/28/2023 PT Missed Time: 10 Minutes Missed Time Reason: Patient fatigue;Patient unwilling to participate  Short Term Goals: Week 1:  PT Short Term Goal 1 (Week 1): Pt will perform sit<>stands using LRAD with CGA PT Short Term Goal 2 (Week 1): Pt will perform bed<>chair transfers using LRAD with CGA PT Short Term Goal 3 (Week 1): Pt will ambulate at least 120ft using LRAD with CGA PT Short Term Goal 4 (Week 1): Pt will navigate at least 8 steps using HRs with CGA  Skilled Therapeutic Interventions/Progress Updates:      Pt lying in bed to start - agreeable to therapy but needing max encouragement to participate. Patient reports general ache's and pains but nonspecific. Distraction and mobility provided for pain management.  Pt reports lots of concerns and frustrations regarding general recovery process. He says he's getting limited sleep due to loud hospital, early AM vitals/labs, and decreased comfort with hospital bed. Patient provides further explanation of his diagnoses and previous medical history. Difficult to redirect patient as he's quite somber and has decreased motivation.   Donned thigh=high compression socks at bed level. Supine<>Sitting EOB completed at supervision level using hospital bed features. Stand pivot transfer completed with light minA to wheelchair. Transported to day room rehab gym.  Completed functional gait training 2x163ft with R HHA and light minA - PT providing gentle TC for lateral weight shifting and adjusting BOS as needed to accommodate for posterior and R trunk lean. Patient reporting high fatigue levels after each gait trial and needs extended seated rest breaks for recovery. He continues to require encouragement for  participation during treatment.  Assisted onto Nustep and completed x5 minutes at L3 resistance using BUE/BLE - challenged patient to keep cadence > 30 spm but patient unable to keep the pace due to fatigue. After this, patient reports he's "done" and can't do anything more. Offered other forms of simple strengthening or balance activities but he refused.   Returned to his room and patient completed ambulatory transfer with minA to his recliner. Seat belt alarm on, call bell and personal items within reach.   He missed the last 10 minutes of therapy treatment due to fatigue.    Therapy Documentation Precautions:  Precautions Precautions: Fall, Other (comment) Precaution Comments: Chronic vertigo; HOH Restrictions Weight Bearing Restrictions Per Provider Order: No General:    Therapy/Group: Individual Therapy  Daelon Dunivan P Lealer [place]  PT, DPT, CSRS  04/28/2023, 7:51 AM

## 2023-04-28 NOTE — Progress Notes (Signed)
Pt noted with cough throughout the morning. Cepacol ordered by PA. This afternoon, pt noted with low grade temp of 99.2. PA dan contacted. Ordered labs and CXR.  Pt is fatigued and resting in bed at this time.   Marylu Lund, RN

## 2023-04-29 DIAGNOSIS — E1169 Type 2 diabetes mellitus with other specified complication: Secondary | ICD-10-CM | POA: Diagnosis not present

## 2023-04-29 DIAGNOSIS — I63511 Cerebral infarction due to unspecified occlusion or stenosis of right middle cerebral artery: Secondary | ICD-10-CM | POA: Diagnosis not present

## 2023-04-29 DIAGNOSIS — N179 Acute kidney failure, unspecified: Secondary | ICD-10-CM | POA: Diagnosis not present

## 2023-04-29 DIAGNOSIS — R7989 Other specified abnormal findings of blood chemistry: Secondary | ICD-10-CM

## 2023-04-29 DIAGNOSIS — I10 Essential (primary) hypertension: Secondary | ICD-10-CM | POA: Diagnosis not present

## 2023-04-29 LAB — GLUCOSE, CAPILLARY
Glucose-Capillary: 115 mg/dL — ABNORMAL HIGH (ref 70–99)
Glucose-Capillary: 123 mg/dL — ABNORMAL HIGH (ref 70–99)
Glucose-Capillary: 98 mg/dL (ref 70–99)
Glucose-Capillary: 143 mg/dL — ABNORMAL HIGH (ref 70–99)

## 2023-04-29 LAB — RESPIRATORY PANEL BY PCR

## 2023-04-29 LAB — SARS CORONAVIRUS 2 BY RT PCR: SARS Coronavirus 2 by RT PCR: NEGATIVE

## 2023-04-29 MED ORDER — GUAIFENESIN ER 600 MG PO TB12
600.0000 mg | ORAL_TABLET | Freq: Two times a day (BID) | ORAL | Status: DC
  Administered 2023-04-29 – 2023-05-02 (×6): 600 mg via ORAL
  Filled 2023-04-29 (×6): qty 1

## 2023-04-29 MED ORDER — SODIUM CHLORIDE 0.9 % IV SOLN: INTRAVENOUS | Status: DC

## 2023-04-29 MED ORDER — GUAIFENESIN 100 MG/5ML PO LIQD
10.0000 mL | ORAL | Status: DC | PRN
  Administered 2023-04-29: 10 mL via ORAL
  Filled 2023-04-29: qty 10

## 2023-04-29 NOTE — Progress Notes (Signed)
Physical Therapy Note  Patient Details  Name: John [name] MRN: [id number] Date of Birth: January 29, 1946 Today's Date: 04/29/2023    Pt refusing therapy 2/2 "I don't have the energy".  Pt using humor this AM, but unable to encourage participation w/ therapy.  Multiple avenues for mobility used but still politely refusing therapy services.  Pt aware of further therapy today and necessity of participation for improved mobility and safe return to home.  Missed therapy time of 60 min.   John [name] 04/29/2023, 9:24 AM

## 2023-04-29 NOTE — Progress Notes (Signed)
Wife called stating she has been sick for the past 4 days. She stated that she visited patient on Monday or Tuesday and has been sick since. States she has the "worst cold I every had and I'm 78 years old."  Patient also presents with low energy and fatigue with temperature of 99.4. He declined all therapies today. Refused all meals. Attempted to get patient to drink ensure, which he declined despite over 30 minutes of encouragement.  Dan PA made aware and COVID and respiratory panel testing ordered.  Patient is currently in bed with meal tray and ensure on bedside table.  Marylu Lund, RN

## 2023-04-29 NOTE — Plan of Care (Signed)
Patient remains alert and oriented. Refused dinner yesterday. Denies dizziness or SOB. Slept well through the night. No new changes to report. Will continue care as planned. Problem: Consults Goal: RH STROKE PATIENT EDUCATION Description: See Patient Education module for education specifics  Outcome: Progressing   Problem: RH BOWEL ELIMINATION Goal: RH STG MANAGE BOWEL WITH ASSISTANCE Description: STG Manage Bowel with toileting Assistance. Outcome: Progressing   Problem: RH BLADDER ELIMINATION Goal: RH STG MANAGE BLADDER WITH ASSISTANCE Description: STG Manage Bladder With toileting Assistance Outcome: Progressing   Problem: RH SAFETY Goal: RH STG ADHERE TO SAFETY PRECAUTIONS W/ASSISTANCE/DEVICE Description: STG Adhere to Safety Precautions With cues Assistance/Device. Outcome: Progressing   Problem: RH KNOWLEDGE DEFICIT Goal: RH STG INCREASE KNOWLEDGE OF DIABETES Description: Patient and family will be able to manage DM using educational resources for medications and diet modification independently Outcome: Progressing Goal: RH STG INCREASE KNOWLEDGE OF HYPERTENSION Description: Patient and family will be able to manage HTN using educational resources for medications and diet modification independently Outcome: Progressing Goal: RH STG INCREASE KNOWLEGDE OF HYPERLIPIDEMIA Description: Patient and family will be able to manage HLD using educational resources for medications and diet modification independently Outcome: Progressing Goal: RH STG INCREASE KNOWLEDGE OF STROKE PROPHYLAXIS Description: Patient and family will be able to manage secondary risks using educational resources for medications and diet modification independently Outcome: Progressing

## 2023-04-29 NOTE — Progress Notes (Signed)
Physical Therapy Session Note  Patient Details  Name: John [name] MRN: [id number] Date of Birth: [birth year]  Today's Date: 04/29/2023 Today's Date: 04/29/2023 PT Missed Time: 45 Minutes Missed Time Reason: Patient fatigue;Patient unwilling to participate  Short Term Goals: Week 1:  PT Short Term Goal 1 (Week 1): Pt will perform sit<>stands using LRAD with CGA PT Short Term Goal 2 (Week 1): Pt will perform bed<>chair transfers using LRAD with CGA PT Short Term Goal 3 (Week 1): Pt will ambulate at least 189ft using LRAD with CGA PT Short Term Goal 4 (Week 1): Pt will navigate at least 8 steps using HRs with CGA  Pt missed 45 min of skilled therapy due to fatigue. Pt politely declined therapy services this session with reports of "not feeling my best today." PTA provided encouragement and emotional support with active listening to increase pt morale during current presentation. PTA suggested sitting EOB and standing as pt has not been OOB today. Pt denied participation in said suggestion. Will re-attempt as schedule and pt availability permits.  Therapy Documentation Precautions:  Precautions Precautions: Fall, Other (comment) Precaution Comments: Chronic vertigo; HOH Restrictions Weight Bearing Restrictions Per Provider Order: No  Therapy/Group: Individual Therapy  [name] PTA 04/29/2023, 3:32 PM

## 2023-04-29 NOTE — Progress Notes (Signed)
Occupational Therapy Note  Patient Details  Name: John [name] MRN: [id number] Date of Birth: Oct 25, 1945  Today's Date: 04/29/2023 OT Missed Time: 45 Minutes Missed Time Reason: Patient fatigue  Pt declined out of bed activity today due to fatigue. Pt missed 45 min of OT.     Roney Mans [facility] 04/29/2023, 3:44 PM

## 2023-04-29 NOTE — Progress Notes (Addendum)
PROGRESS NOTE   Subjective/Complaints:  Patient reports he continues to have cough.  Patient reports this has been present since he initially got to the acute side of the hospital and has not gotten particularly worse. Appears he missed therapy later today due to fatigue.  ROS: as per HPI. Denies CP, SOB, abd pain, N/V/D/C, or any other complaints at this time.    + cough- continued Fatigue noted in therapy notes later in the day  Objective:   DG Chest 2 View Result Date: 04/28/2023 CLINICAL DATA:  Cough. EXAM: CHEST - 2 VIEW COMPARISON:  12/10/2019. FINDINGS: The heart size and mediastinal contours are within normal limits. No consolidation, effusion, or pneumothorax. Degenerative changes are present in the thoracic spine. No acute osseous abnormality is seen. IMPRESSION: No active cardiopulmonary disease. Electronically Signed   By: Thornell Sartorius M.D.   On: 04/28/2023 20:14   Recent Labs    04/28/23 1708  WBC 8.8  HGB 12.3*  HCT 37.7*  PLT 259    Recent Labs    04/28/23 1708  NA 133*  K 4.2  CL 100  CO2 20*  GLUCOSE 164*  BUN 13  CREATININE 1.24  CALCIUM 9.1     Intake/Output Summary (Last 24 hours) at 04/29/2023 1644 Last data filed at 04/29/2023 1400 Gross per 24 hour  Intake 0 ml  Output --  Net 0 ml         Physical Exam: Vital Signs Blood pressure 137/70, pulse 93, temperature 99.4 F (37.4 C), resp. rate 18, height 6' (1.829 m), weight 101.9 kg, SpO2 94%.    Physical Exam    04/29/2023    2:14 PM 04/29/2023    8:03 AM 04/28/2023    7:53 PM  Vitals with BMI  Systolic 137 155 528  Diastolic 70 67 65  Pulse 93  87    General: No acute distress, laying in bed.  Vital signs reviewed Mood and affect are appropriate Heart: Regular rate and rhythm no rubs murmurs or extra sounds Chest - s/p right masectomy  Lungs: Clear to auscultation, breathing unlabored, no rales or wheezes, on  RA Abdomen: Positive bowel sounds, soft nontender to palpation, nondistended Extremities: No clubbing, cyanosis, or edema Skin: No evidence of breakdown, no evidence of rash Neurologic: Cranial nerves II through XII intact, motor strength is 5/5 in bilateral deltoid, bicep, tricep, grip, hip flexor, knee extensors, ankle dorsiflexor and plantar flexor  Cerebellar exam normal finger to nose to finger as well as heel to shin in bilateral upper and lower extremities  Exam Stable 1/17 Musculoskeletal: Full range of motion in all 4 extremities. No joint swelling     Assessment/Plan: 1. Functional deficits which require 3+ hours per day of interdisciplinary therapy in a comprehensive inpatient rehab setting. Physiatrist is providing close team supervision and 24 hour management of active medical problems listed below. Physiatrist and rehab team continue to assess barriers to discharge/monitor patient progress toward functional and medical goals  Care Tool:  Bathing    Body parts bathed by patient: Right arm, Left arm, Chest, Abdomen, Front perineal area, Right upper leg, Left upper leg, Face, Buttocks, Left lower leg, Right lower  leg   Body parts bathed by helper: Buttocks, Left lower leg, Right lower leg     Bathing assist Assist Level: Minimal Assistance - Patient > 75%     Upper Body Dressing/Undressing Upper body dressing   What is the patient wearing?: Pull over shirt    Upper body assist Assist Level: Supervision/Verbal cueing    Lower Body Dressing/Undressing Lower body dressing      What is the patient wearing?: Pants     Lower body assist Assist for lower body dressing: Minimal Assistance - Patient > 75%     Toileting Toileting Toileting Activity did not occur (Clothing management and hygiene only): N/A (no void or bm)  Toileting assist Assist for toileting: Minimal Assistance - Patient > 75%     Transfers Chair/bed transfer  Transfers assist      Chair/bed transfer assist level: Minimal Assistance - Patient > 75%     Locomotion Ambulation   Ambulation assist      Assist level: Minimal Assistance - Patient > 75% Assistive device: Walker-rolling Max distance: 150   Walk 10 feet activity   Assist     Assist level: Minimal Assistance - Patient > 75% Assistive device: Walker-rolling   Walk 50 feet activity   Assist    Assist level: Minimal Assistance - Patient > 75% Assistive device: Walker-rolling    Walk 150 feet activity   Assist Walk 150 feet activity did not occur: Safety/medical concerns  Assist level: Minimal Assistance - Patient > 75% Assistive device: Walker-rolling    Walk 10 feet on uneven surface  activity   Assist Walk 10 feet on uneven surfaces activity did not occur: Safety/medical concerns         Wheelchair     Assist Is the patient using a wheelchair?: Yes (for transport b/c of impaired activity tolerance limiting gait distance) Type of Wheelchair: Manual    Wheelchair assist level: Dependent - Patient 0%      Wheelchair 50 feet with 2 turns activity    Assist        Assist Level: Dependent - Patient 0%   Wheelchair 150 feet activity     Assist      Assist Level: Dependent - Patient 0%   Blood pressure 137/70, pulse 93, temperature 99.4 F (37.4 C), resp. rate 18, height 6' (1.829 m), weight 101.9 kg, SpO2 94%.  Medical Problem List and Plan: 1. 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  Appears fatigue may be more of a chronic issue 2.  Antithrombotics: -DVT/anticoagulation:  Mechanical: Antiembolism stockings, thigh (TED hose) Bilateral lower extremities -antiplatelet therapy: Aspirin 81 mg daily and  Plavix 75 mg daily x 3 weeks then Plavix alone 3. Pain Management: Tylenol as needed 4. Mood/Behavior/Sleep: Not emotional support             -antipsychotic agents: N/A  -1/17 Suspect his fatigue may be mood related, consider starting SSRI tomorrow if he is agreeable 5. Neuropsych/cognition: This patient is capable of making decisions on his own behalf. 6. Skin/Wound Care: Routine skin checks 7. Fluids/Electrolytes/Nutrition: Routine in and outs with follow-up  chemistries 8.  Hypertension.  Norvasc 5 mg daily.  Monitor with increased mobility  -1/11 intermittently elevated continue to monitor -04/24/23 BPs fine, cont regimen; of note, pharmacy stated he was on furosemide 80mg  daily but down to 40mg  daily on inpatient, but not resumed here    Vitals:   04/26/23 1343 04/26/23 2005 04/27/23 0446 04/27/23 1413  BP: 127/68 137/65 (!) 149/77 (!) 140/62   04/27/23 2025 04/28/23 0439 04/28/23 0822 04/28/23 1420  BP: 132/65 (!) 156/66 (!) 124/52 134/71   04/28/23 1518 04/28/23 1953 04/29/23 0803 04/29/23 1414  BP: (!) 152/68 135/65 (!) 155/67 137/70   Increase amlodipine to 10mg  on 1/14 monitor  - overall improved ,mildly elevated systolic this am   -1/17 BP stable continue current regimen   9.  Diabetes mellitus peripheral neuropathy.  Latest hemoglobin A1c 12.3.  Currently on NovoLog 70/30 22 units twice daily.  Diabetic teaching.  Patient's home regimen of Glucophage 1000 mg twice daily as well as Toujeo 15 units in the morning and 70 units in the evening-- currently held.  Resume as needed 1/11 CBGs mildly elevated, restart metformin XR at 500 mg dose, continue current dose of 70/30 for now -04/24/23 CBGs in the 200s, recent med adjustment, monitor for trend with that change, but suspect he'll likely need further adjustments CBG (last 3)  Recent Labs    04/28/23 1950 04/29/23 0552 04/29/23 1140  GLUCAP 96 115* 123*   Increase novalog 70/30 to 22U BID first dose 1/13 pm , improved CBG this  am , may be able to d/c metformin   1/17 CBGs controlled continue current 10.  Chronic vertigo.  Continue Antivert 12.5 mg 3 times daily prn.  Patient followed outpatient by ENT   11.  BPH.  Proscar 5 mg daily, Flomax 0.4 mg daily.  Check PVR.  Followed by urology Dr. Tobie Lords [facility]  -1/11 continent continue current regimen  -04/24/23 no PVRs documented but continent, cont regimen   12.  History of breast cancer.  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  Status post modified radical mastectomy and chemotherapy.  Patient on tamoxifen 20mg  daily since April 2020 follow-up with outpatient oncology Dr Allison Quarry Vallathucherry and resume tamoxifen as outpatient.   13.  GERD.  Protonix 40mg  daily   14.  AKI on CKD.  Creatinine baseline 1.29-1.64.  Follow-up chemistries  -1/16 BUN and creatinine stable at 13/1.24   15.  Obesity.  BMI 30.45.  Dietary follow-up  16. Constipation: on miralax BID- large cont liquid BM this am  -04/24/23 reports LBM was yesterday or two days ago, no documentation of BMs on file-- also refusing miralax; if no BM by tomorrow, would consider encouraging pt to use miralax, or using alternative bowel regimen that he'd be willing to use.  -1/16 LBM today  17. Cough  -CXR ordered-no acute changes  -WBC within normal limits on CBC 1/16   -1/17 respiratory panel is pending  -Mucinex ordered    18.  Mildly elevated LFTs  -Monitor Tylenol usage, recheck Monday   LOS: 7 days A FACE TO FACE EVALUATION WAS PERFORMED  Fanny Dance 04/29/2023, 4:44 PM

## 2023-04-29 NOTE — Progress Notes (Signed)
[address] [place] contacted regarding respiratory panel results. Patient positive for Metapneumovirus. Negative for COVID.  New orders in placed by PA.   Contacted Infection control regarding the required isolation for the above pathogen. Patient Placed on droplet precaution per IP recommendation.    Marylu Lund, RN

## 2023-04-30 DIAGNOSIS — I6389 Other cerebral infarction: Secondary | ICD-10-CM | POA: Diagnosis not present

## 2023-04-30 DIAGNOSIS — B9781 Human metapneumovirus as the cause of diseases classified elsewhere: Secondary | ICD-10-CM

## 2023-04-30 DIAGNOSIS — I10 Essential (primary) hypertension: Secondary | ICD-10-CM | POA: Diagnosis not present

## 2023-04-30 DIAGNOSIS — R739 Hyperglycemia, unspecified: Secondary | ICD-10-CM | POA: Diagnosis not present

## 2023-04-30 DIAGNOSIS — R5383 Other fatigue: Secondary | ICD-10-CM

## 2023-04-30 LAB — CBC
HCT: 38 % — ABNORMAL LOW (ref 39.0–52.0)
Hemoglobin: 12.6 g/dL — ABNORMAL LOW (ref 13.0–17.0)
MCH: 27.3 pg (ref 26.0–34.0)
MCHC: 33.2 g/dL (ref 30.0–36.0)
MCV: 82.4 fL (ref 80.0–100.0)
Platelets: 265 10*3/uL (ref 150–400)
RBC: 4.61 MIL/uL (ref 4.22–5.81)
RDW: 15.6 % — ABNORMAL HIGH (ref 11.5–15.5)
WBC: 8.6 10*3/uL (ref 4.0–10.5)
nRBC: 0 % (ref 0.0–0.2)

## 2023-04-30 LAB — BASIC METABOLIC PANEL
Anion gap: 13 (ref 5–15)
BUN: 11 mg/dL (ref 8–23)
CO2: 20 mmol/L — ABNORMAL LOW (ref 22–32)
Calcium: 9.1 mg/dL (ref 8.9–10.3)
Chloride: 99 mmol/L (ref 98–111)
Creatinine, Ser: 1.11 mg/dL (ref 0.61–1.24)
GFR, Estimated: >60 mL/min (ref >60)
Glucose, Bld: 176 mg/dL — ABNORMAL HIGH (ref 70–99)
Potassium: 4.3 mmol/L (ref 3.5–5.1)
Sodium: 132 mmol/L — ABNORMAL LOW (ref 135–145)

## 2023-04-30 LAB — GLUCOSE, CAPILLARY
Glucose-Capillary: 211 mg/dL — ABNORMAL HIGH (ref 70–99)
Glucose-Capillary: 193 mg/dL — ABNORMAL HIGH (ref 70–99)
Glucose-Capillary: 216 mg/dL — ABNORMAL HIGH (ref 70–99)
Glucose-Capillary: 150 mg/dL — ABNORMAL HIGH (ref 70–99)

## 2023-04-30 MED ORDER — ONDANSETRON HCL 4 MG PO TABS: 4.0000 mg | ORAL_TABLET | Freq: Three times a day (TID) | ORAL | Status: DC | PRN

## 2023-04-30 MED ORDER — ONDANSETRON HCL 4 MG/2ML IJ SOLN
4.0000 mg | Freq: Three times a day (TID) | INTRAMUSCULAR | Status: DC | PRN
Start: 2023-04-30 — End: 2023-05-07
  Filled 2023-04-30: qty 2

## 2023-04-30 MED ORDER — ONDANSETRON HCL 4 MG PO TABS
4.0000 mg | ORAL_TABLET | Freq: Three times a day (TID) | ORAL | Status: DC | PRN
Start: 2023-04-30 — End: 2023-05-07
  Administered 2023-05-05: 4 mg via ORAL
  Filled 2023-04-30: qty 1

## 2023-04-30 MED ORDER — SODIUM CHLORIDE 0.9 % IV SOLN: INTRAVENOUS | Status: AC

## 2023-04-30 NOTE — Progress Notes (Signed)
Patient ID: John [name], male   DOB: 09-17-1945, 78 y.o.   MRN: [id number]  Patient sleeping most of the shift. Minimal intake, IVF NS at 59mL/hr continuous. SSI discontinued, 70/30 22 units given BID. Flutter valve ordered for patient. VS improving with patient remaining afebrile throughout shift. Positive for cough and loosening mucus. One episode emesis this morning, IV Zofran given. 650 mg Tylenol given for headache once on daytime shift.   Kennyth Arnold, RN3, BSN, CRRN, [facility]

## 2023-04-30 NOTE — Progress Notes (Signed)
Physical Therapy Session Note  Patient Details  Name: John [name] MRN: [id number] Date of Birth: 1945-12-22  Today's Date: 04/30/2023 PT Individual Time: 8295-6213 PT Individual Time Calculation (min): 45 min   Short Term Goals: Week 1:  PT Short Term Goal 1 (Week 1): Pt will perform sit<>stands using LRAD with CGA PT Short Term Goal 2 (Week 1): Pt will perform bed<>chair transfers using LRAD with CGA PT Short Term Goal 3 (Week 1): Pt will ambulate at least 169ft using LRAD with CGA PT Short Term Goal 4 (Week 1): Pt will navigate at least 8 steps using HRs with CGA  Skilled Therapeutic Interventions/Progress Updates:    Pt received supine in bed, awake and with encouragement is agreeable to therapy session. Pt reports his wife has questions regarding his POC - spoke with pt's wife via speaker on pt's personal phone and educated her on the pt's ELOS and D/C date set last week during team conference and plan to re-assess in the upcoming week if there will be a change needed given current medical status. Pt reports feeling he currently does not believe he will be ready by Thursday, 1/23.  With encouragement, pt agreeable to transition to sitting EOB. Supine>sitting R EOB, HOB partially elevated and using bedrail, with light mod A for bringing trunk upright. Pt tolerated sitting EOB ~87minutes targeting upright, unsupported activity tolerance with focus on breathing strategies. Pt with productive cough - nurse notified and discussed possible benefit of flutter valve, educated pt on this discussion.   With more encouragement, pt agreeable to transition sit>stand from highly elevated EOB>RW with light min A and side step R towards HOB using RW with CGA/light min A.   Returned to sitting using bed features with CGA and increased time/effort. Pt reports feeling tired from this amount of activity. Pt left supine in bed with needs in reach, bed alarm on, and lines intact.   Therapy  Documentation Precautions:  Precautions Precautions: Fall, Other (comment) Precaution Comments: Chronic vertigo; HOH Restrictions Weight Bearing Restrictions Per Provider Order: No   Pain:  No reports of pain throughout session.    Therapy/Group: Individual Therapy  Ginny Forth , PT, DPT, NCS, CSRS 04/30/2023, 1:29 PM

## 2023-04-30 NOTE — Progress Notes (Signed)
   04/30/23 2130  Assess: MEWS Score  Temp 98.2 F (36.8 C)  BP (!) 154/50  MAP (mmHg) 78  Pulse Rate (!) 48  Resp (!) 22  SpO2 93 %  O2 Device Room Air  Assess: MEWS Score  MEWS Temp 0  MEWS Systolic 0  MEWS Pulse 1  MEWS RR 1  MEWS LOC 0  MEWS Score 2  MEWS Score Color Yellow  Assess: if the MEWS score is Yellow or Red  Were vital signs accurate and taken at a resting state? Yes  Does the patient meet 2 or more of the SIRS criteria? No  MEWS guidelines implemented  Yes, yellow  Treat  MEWS Interventions Considered administering scheduled or prn medications/treatments as ordered  Take Vital Signs  Increase Vital Sign Frequency  Yellow: Q2hr x1, continue Q4hrs until patient remains green for 12hrs  Escalate  MEWS: Escalate Yellow: Discuss with charge nurse and consider notifying provider and/or RRT  Notify: Charge Nurse/RN  Name of Charge Nurse/RN Notified [facility]  Provider Notification  Provider Name/Title Dr. Dalene Carrow  Date Provider Notified 04/30/23  Time Provider Notified [phone]  Method of Notification Call  Notification Reason Change in status  Provider response See new orders  Date of Provider Response 04/30/23  Time of Provider Response 0651  Assess: SIRS CRITERIA  SIRS Temperature  0  SIRS Respirations  1  SIRS Pulse 0  SIRS WBC 0  SIRS Score Sum  1

## 2023-04-30 NOTE — Progress Notes (Signed)
@   1945 patient wide awake smiling, talking to wife on phone. Remembered my name from last weekend. Reports not feeling well over past couple days. At 2017, prn tylenol given for generalized aches & discomfort. At 2018, 1st dose of scheduled mucinex given. 0.9% NS started at 2030. 1+ pitting edema observed to BLE's, right>left. Continues with congested cough. 0056 PRN cepacol loz.given per patient's request. Declined PRN robitussin. Refused scheduled miralax & triamcinolone cream. At 0130, unsure of last void. Bladder scan=708. Incontinent void noted, requiring complete bed change and clothing change. Up to bathroom, via WC-void & small BM. PVR=273ml. Encouraged PO intake. Alfredo Martinez A

## 2023-04-30 NOTE — Progress Notes (Addendum)
PROGRESS NOTE   Subjective/Complaints:  Pt not feeling well today, +metapneumovirus on respiratory panel yesterday. Says he slept ok, but is still very tired. Not wanting to participate in therapies much today. Worked a little with OT but not much.  Denies pain, just tired.  LBM yesterday. Urinating ok but incontinent.  +N/V, +cough, +fatigue. Otherwise denies any other complaints or concerns, just feels puny.   ROS: as per HPI. Denies CP, SOB, abd pain, diarrhea, constipation, or any other complaints at this time.   + cough- continued +Fatigue noted in therapy notes later in the day   Objective:   DG Chest 2 View Result Date: 04/28/2023 CLINICAL DATA:  Cough. EXAM: CHEST - 2 VIEW COMPARISON:  12/10/2019. FINDINGS: The heart size and mediastinal contours are within normal limits. No consolidation, effusion, or pneumothorax. Degenerative changes are present in the thoracic spine. No acute osseous abnormality is seen. IMPRESSION: No active cardiopulmonary disease. Electronically Signed   By: Thornell Sartorius M.D.   On: 04/28/2023 20:14   Recent Labs    04/28/23 1708 04/30/23 0552  WBC 8.8 8.6  HGB 12.3* 12.6*  HCT 37.7* 38.0*  PLT 259 265    Recent Labs    04/28/23 1708 04/30/23 0552  NA 133* 132*  K 4.2 4.3  CL 100 99  CO2 20* 20*  GLUCOSE 164* 176*  BUN 13 11  CREATININE 1.24 1.11  CALCIUM 9.1 9.1     Intake/Output Summary (Last 24 hours) at 04/30/2023 1154 Last data filed at 04/30/2023 0149 Gross per 24 hour  Intake 611.1 ml  Output --  Net 611.1 ml         Physical Exam: Vital Signs Blood pressure (!) 107/59, pulse 85, temperature 100.1 F (37.8 C), temperature source Oral, resp. rate (!) 22, height 6' (1.829 m), weight 101.9 kg, SpO2 92%.    Physical Exam General: No acute distress, laying in bed but pretty tired, borderline temp 100.1.  Vital signs reviewed Mood and affect are subdued Heart:  Regular rate and rhythm no rubs murmurs or extra sounds Chest - s/p right masectomy  Lungs: Clear to auscultation although minimal effort, breathing unlabored, no rales or wheezes, on RA Abdomen: Positive bowel sounds, soft nontender to palpation, nondistended Extremities: No clubbing, cyanosis, or edema Skin: No evidence of breakdown, no evidence of rash over exposed surfaces  PRIOR EXAMS: Neurologic: Cranial nerves II through XII intact, motor strength is 5/5 in bilateral deltoid, bicep, tricep, grip, hip flexor, knee extensors, ankle dorsiflexor and plantar flexor  Cerebellar exam normal finger to nose to finger as well as heel to shin in bilateral upper and lower extremities  Exam Stable 1/17 Musculoskeletal: Full range of motion in all 4 extremities. No joint swelling     Assessment/Plan: 1. Functional deficits which require 3+ hours per day of interdisciplinary therapy in a comprehensive inpatient rehab setting. Physiatrist is providing close team supervision and 24 hour management of active medical problems listed below. Physiatrist and rehab team continue to assess barriers to discharge/monitor patient progress toward functional and medical goals  Care Tool:  Bathing    Body parts bathed by patient: Right arm, Left arm, Chest, Abdomen,  Front perineal area, Right upper leg, Left upper leg, Face, Buttocks, Left lower leg, Right lower leg   Body parts bathed by helper: Buttocks, Left lower leg, Right lower leg     Bathing assist Assist Level: Minimal Assistance - Patient > 75%     Upper Body Dressing/Undressing Upper body dressing   What is the patient wearing?: Pull over shirt    Upper body assist Assist Level: Supervision/Verbal cueing    Lower Body Dressing/Undressing Lower body dressing      What is the patient wearing?: Pants     Lower body assist Assist for lower body dressing: Minimal Assistance - Patient > 75%     Toileting Toileting Toileting  Activity did not occur (Clothing management and hygiene only): N/A (no void or bm)  Toileting assist Assist for toileting: Minimal Assistance - Patient > 75%     Transfers Chair/bed transfer  Transfers assist     Chair/bed transfer assist level: Minimal Assistance - Patient > 75%     Locomotion Ambulation   Ambulation assist      Assist level: Minimal Assistance - Patient > 75% Assistive device: Walker-rolling Max distance: 150   Walk 10 feet activity   Assist     Assist level: Minimal Assistance - Patient > 75% Assistive device: Walker-rolling   Walk 50 feet activity   Assist    Assist level: Minimal Assistance - Patient > 75% Assistive device: Walker-rolling    Walk 150 feet activity   Assist Walk 150 feet activity did not occur: Safety/medical concerns  Assist level: Minimal Assistance - Patient > 75% Assistive device: Walker-rolling    Walk 10 feet on uneven surface  activity   Assist Walk 10 feet on uneven surfaces activity did not occur: Safety/medical concerns         Wheelchair     Assist Is the patient using a wheelchair?: Yes (for transport b/c of impaired activity tolerance limiting gait distance) Type of Wheelchair: Manual    Wheelchair assist level: Dependent - Patient 0%      Wheelchair 50 feet with 2 turns activity    Assist        Assist Level: Dependent - Patient 0%   Wheelchair 150 feet activity     Assist      Assist Level: Dependent - Patient 0%   Blood pressure (!) 107/59, pulse 85, temperature 100.1 F (37.8 C), temperature source Oral, resp. rate (!) 22, height 6' (1.829 m), weight 101.9 kg, SpO2 92%.  Medical Problem List and Plan: 1. 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  Appears fatigue may be more of a chronic issue 2.  Antithrombotics: -DVT/anticoagulation:  Mechanical: Antiembolism stockings, thigh (TED hose) Bilateral lower extremities -antiplatelet therapy: Aspirin 81 mg daily and Plavix 75 mg daily x 3 weeks then Plavix alone 3. Pain Management: Tylenol as needed 4. Mood/Behavior/Sleep: Not emotional support             -antipsychotic agents: N/A -1/17 Suspect his fatigue may be mood related, consider starting SSRI tomorrow if he is agreeable-- +metapneumovirus on resp panel, likely culprit  for increased fatigue; defer SSRI decision to after he's improved from this illness. 5. Neuropsych/cognition: This patient is capable of making decisions on his own behalf. 6. Skin/Wound Care: Routine skin checks 7. Fluids/Electrolytes/Nutrition: Routine in and outs with follow-up chemistries 8.  Hypertension.  Norvasc 5 mg daily.  Monitor with increased mobility  -1/11 intermittently elevated continue to monitor -04/24/23 BPs fine, cont regimen; of note, pharmacy stated he was on furosemide 80mg  daily but down to 40mg  daily on inpatient, but not resumed here  Increase amlodipine to 10mg  on 1/14 monitor  - overall improved ,mildly elevated systolic this am  -1/17 BP stable continue current regimen  -04/30/23 BPs variable, will monitor trend for now, given illness.  Vitals:   04/27/23 2025 04/28/23 0439 04/28/23 0822 04/28/23 1420  BP: 132/65 (!) 156/66 (!) 124/52 134/71   04/28/23 1518 04/28/23 1953 04/29/23 0803 04/29/23 1414  BP: (!) 152/68 135/65 (!) 155/67 137/70   04/29/23 1933 04/30/23 0054 04/30/23 0642 04/30/23 0907  BP: (!) 155/66 (!) 149/67 (!) 154/50 (!) 107/59     9.  Diabetes mellitus peripheral neuropathy.  Latest hemoglobin A1c 12.3.  Currently on NovoLog 70/30 22 units twice daily.  Diabetic teaching.  Patient's home regimen of Glucophage 1000 mg twice daily as well as Toujeo 15 units in the morning  and 70 units in the evening-- currently held.  Resume as needed 1/11 CBGs mildly elevated, restart metformin XR at 500 mg dose, continue current dose of 70/30 for now -04/24/23 CBGs in the 200s, recent med adjustment, monitor for trend with that change, but suspect he'll likely need further adjustments -Increase novalog 70/30 to 22U BID first dose 1/13 pm , improved CBG this am , may be able to d/c metformin  -1/17 CBGs controlled continue current -04/30/23 CBGs fine, not eating much, SSI d/c'd for now; getting IVFs today; monitor for now, but suspect CBGs will be less d/t poor PO intake  CBG (last 3)  Recent Labs    04/29/23 2139 04/30/23 0650 04/30/23 1122  GLUCAP 143* 211* 193*   10.  Chronic vertigo.  Continue Antivert 12.5 mg 3 times daily prn.  Patient followed outpatient by ENT   11.  BPH.  Proscar 5 mg daily, Flomax 0.4 mg daily.  Check PVR.  Followed by urology Dr. Tobie Lords [facility]  -1/11 continent continue current regimen  -04/24/23 no PVRs documented but continent, cont regimen   12.  History of breast cancer.  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  Status post modified radical mastectomy and chemotherapy.  Patient on tamoxifen 20mg  daily since April 2020 follow-up with outpatient oncology Dr Allison Quarry Vallathucherry and resume tamoxifen as outpatient.   13.  GERD.  Protonix 40mg  daily   14.  AKI on CKD.  Creatinine baseline 1.29-1.64.  Follow-up chemistries  -1/16 BUN and creatinine stable at 13/1.24   -04/30/23 Cr 1.11 today, getting fluids today d/t poor PO intake  15.  Obesity.  BMI 30.45.  Dietary follow-up  16. Constipation: on miralax BID- large cont liquid BM this am  -04/24/23 reports LBM was yesterday or two days ago, no documentation of BMs on file-- also refusing miralax; if no BM by tomorrow, would consider encouraging pt to use miralax, or using alternative bowel regimen that he'd be willing to use.  -04/30/23 LBM yesterday, monitor  17.  Cough/nausea/Metapneumovirus- on droplet precautions  -CXR ordered-no acute changes  -WBC within normal limits on CBC 1/16 and 1/18   -1/17 respiratory panel is pending  -  Mucinex ordered -04/30/23 resp panel showing +Metapneumovirus, likely culprit for his fatigue and low grade temps; cont supportive care, getting IVFs, might miss therapies today d/t not feeling well. Zofran ordered.     18.  Mildly elevated LFTs  -Monitor Tylenol usage, recheck Monday  I spent >45mins performing patient care related activities, including face to face time, documentation time, review of illness and meds, discussion of illness with patient and nursing staff, and overall coordination of care.   LOS: 8 days A FACE TO FACE EVALUATION WAS PERFORMED  [address] 04/30/2023, 11:54 AM

## 2023-04-30 NOTE — Progress Notes (Signed)
Large incontinent void this am. Complained of feeling nauseated, dry heaves, no emesis. Spoke with Dr. Dalene Carrow, SSI dc'd. 70/30 insulin given per Dr. Dalene Carrow. Congested cough at times. Audible wheeze with exertion. John [name] A

## 2023-04-30 NOTE — Progress Notes (Signed)
Physical Therapy Session Note  Patient Details  Name: John [name] MRN: [id number] Date of Birth: January 04, 1946  Today's Date: 04/30/2023 PT Missed Time: 45 Minutes Missed Time Reason: Patient ill (Comment) (virus)  Per nurse, pt still not feeling well today and was asleep recently. Pt slept through environmental services cleaning his room. Therapist arrived and pt still supine in bed asleep. 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 Educated pt on importance of even a little mobility for pulmonary health due to virus, but he continues to report not feeling up to participating. Therapist will return to assess pt for afternoon session today.  Ginny Forth , PT, DPT, NCS, CSRS 04/30/2023, 7:56 AM

## 2023-04-30 NOTE — Progress Notes (Signed)
Occupational Therapy Session Note  Patient Details  Name: John [name] MRN: [id number] Date of Birth: 05/30/1945  Today's Date: 04/30/2023 OT Individual Time: 4259-5638 OT Individual Time Calculation (min): 60 min    Short Term Goals: Week 1:  OT Short Term Goal 1 (Week 1): STGs=LTGs due patient's estimated length of stay.  Skilled Therapeutic Interventions/Progress Updates:    Patient asleep in bed at the time of arrival, patient in agreement with completing BADL related task for starting his day.  Patient experiencing nausea  and vomiting.  Pt  agreed to bathing and dress EOB. Patient was able to come EOB from supine with ModA and additional time. Patient was able to bathe his UB and ULE with s/u A.  The pt was able to come from sit to stand for completion of hygiene of his perineal and bottom using the RW for additional balance at [facility].  The pt was encouraged to incorporate relaxation breathing to improve compliance. A cold compress was applied to the patient's neck and temper region to aid with his nausea. The pt was able to donn his brief and pants with ModA, he was MinA with donning his over head shirt.  The pt returned from EOB to supine with ModA for placement.  The pt was ModAx2 for positioning secondary to fatigue.  The physician came in during the session and was able to monitor the pt progress as well as address any additional needs. At the end of the session, the call light and bedside table were placed within reach and all additional needs were addressed. The pt presented with a BP of 107/59, HR 72, 02  saturation of  92% on room air and a  pulse of 85  Therapy Documentation Precautions:  Precautions Precautions: Fall, Other (comment) Precaution Comments: Chronic vertigo; HOH Restrictions Weight Bearing Restrictions Per Provider Order: No   Therapy/Group: Individual Therapy  Lavona Mound 04/30/2023, 9:22 AM

## 2023-05-01 DIAGNOSIS — I6389 Other cerebral infarction: Secondary | ICD-10-CM | POA: Diagnosis not present

## 2023-05-01 DIAGNOSIS — R739 Hyperglycemia, unspecified: Secondary | ICD-10-CM | POA: Diagnosis not present

## 2023-05-01 DIAGNOSIS — B9781 Human metapneumovirus as the cause of diseases classified elsewhere: Secondary | ICD-10-CM | POA: Diagnosis not present

## 2023-05-01 DIAGNOSIS — I10 Essential (primary) hypertension: Secondary | ICD-10-CM | POA: Diagnosis not present

## 2023-05-01 LAB — GLUCOSE, CAPILLARY
Glucose-Capillary: 129 mg/dL — ABNORMAL HIGH (ref 70–99)
Glucose-Capillary: 147 mg/dL — ABNORMAL HIGH (ref 70–99)
Glucose-Capillary: 103 mg/dL — ABNORMAL HIGH (ref 70–99)
Glucose-Capillary: 76 mg/dL (ref 70–99)

## 2023-05-01 MED ORDER — INSULIN ASPART 100 UNIT/ML IJ SOLN: 0.0000 [IU] | Freq: Every day | INTRAMUSCULAR | Status: DC

## 2023-05-01 MED ORDER — INSULIN ASPART 100 UNIT/ML IJ SOLN
0.0000 [IU] | Freq: Three times a day (TID) | INTRAMUSCULAR | Status: DC
  Administered 2023-05-01: 1 [IU] via SUBCUTANEOUS
  Administered 2023-05-02: 2 [IU] via SUBCUTANEOUS
  Administered 2023-05-02: 1 [IU] via SUBCUTANEOUS
  Administered 2023-05-03: 2 [IU] via SUBCUTANEOUS
  Administered 2023-05-04 – 2023-05-05 (×3): 1 [IU] via SUBCUTANEOUS
  Administered 2023-05-05: 2 [IU] via SUBCUTANEOUS
  Administered 2023-05-06: 3 [IU] via SUBCUTANEOUS
  Administered 2023-05-06 (×2): 2 [IU] via SUBCUTANEOUS
  Administered 2023-05-07: 1 [IU] via SUBCUTANEOUS

## 2023-05-01 NOTE — Progress Notes (Signed)
Reports feeling better. Watching football game. Continent all night. Calling for assistance. Ate a few packs of saltines at HS. Wanted to wait on eating breakfast this morning. Without complaint of N&V. PRN tylenol given at HS for generalized discomfort and achy feeling all over. Encouraged patient to use flutter valve, haven't witnessed him using. Alfredo Martinez A

## 2023-05-01 NOTE — Progress Notes (Signed)
Occupational Therapy Session Note  Patient Details  Name: John [name] MRN: [id number] Date of Birth: 1945-07-18  Today's Date: 05/01/2023 OT Individual Time: 0100-0200 OT Individual Time Calculation (min): 60 min    Short Term Goals: Week 1:  OT Short Term Goal 1 (Week 1): STGs=LTGs due patient's estimated length of stay.  Skilled Therapeutic Interventions/Progress Updates:    Patient in bed at the time of arrival asleep.  Patient in agreement with completing BADL related task to improve functional independence. The pt was able to come from supine in bed to EOB with SBA.  The pt was able to come from sit to stand using the RW with CGA and vc's for safety.   in relation safety, the pt was instructed  to placement of his hand on the surface he was going to oppose to both hands on the RW when coming in to standing position. The pt went on to  complete washing his face and hair with s/u A .  The pt was able to doff and donn his over head shirt with s/u A and  vc's regarding attention to his IV lines.  The  pt completed UB exercise using a towel for shld flexion, horizontal abduction and shld rotation 2 set of 10 with rest breaks as needed. The pt required 1 rest break with each exercise. The pt went on to transfer back to bed LOF using the bed rail at MinA. All additional needs were addressed prior to exiting the room.    Therapy Documentation Precautions:  Precautions Precautions: Fall, Other (comment) Precaution Comments: Chronic vertigo; HOH Restrictions Weight Bearing Restrictions Per Provider Order: No Therapy/Group: Individual Therapy  Lavona Mound 05/01/2023, 3:02 PM

## 2023-05-01 NOTE — Progress Notes (Signed)
Physical Therapy Session Note  Patient Details  Name: John [name] MRN: [id number] Date of Birth: 12-16-1945  Today's Date: 05/01/2023 PT Individual Time: 1123-1205 PT Individual Time Calculation (min): 42 min   Short Term Goals: Week 1:  PT Short Term Goal 1 (Week 1): Pt will perform sit<>stands using LRAD with CGA PT Short Term Goal 2 (Week 1): Pt will perform bed<>chair transfers using LRAD with CGA PT Short Term Goal 3 (Week 1): Pt will ambulate at least 134ft using LRAD with CGA PT Short Term Goal 4 (Week 1): Pt will navigate at least 8 steps using HRs with CGA  Skilled Therapeutic Interventions/Progress Updates: Pt presented in bed agreeable to therapy. Pt denies pain and states "doing better" than yesterday. Pt requesting to use bathroom. Completed bed mobility with supervision and use of bed features. Performed Sit to stand from lowered bed with CGA however noted significant bracing against bed for support. Pt ambulated to toilet with RW and CGA and completed toilet transfers with CGA. Pt with continent B&B void/charted. Pt able to complete peri-care with supervision. Pt then ambulated to sink and completed hand hygiene in standing with supervision. Pt then completed ambulatory transfer to w/c CGA. Pt transported to main gym for energy conservation. Completed stand step transfer to high/low mat with supervision. Pt then participated in standing balance activity throwing horseshoes with moderate reaches. Pt with mild posterior lean requiring cues and facilitation to correct. Pt required increased time after activity for recovery with pt expressing frustration due to making progress then getting sick causing backtracking. PTA providing active listening and support. Pt then worked on stepping onto [employer] with RW and maintaining static stand. Pt able to stand on airex with CGA requiring cues for improved erect posture. Pt then transferred to w/c and transported back to room. In room completed  ambulatory transfer to bed with RW and CGA overall. Pt left seated EOB at end of session with NT present performing glucose check and all needs met.      Therapy Documentation Precautions:  Precautions Precautions: Fall, Other (comment) Precaution Comments: Chronic vertigo; HOH Restrictions Weight Bearing Restrictions Per Provider Order: No General:   Vital Signs:   Pain: Pain Assessment Pain Scale: 0-10 Pain Score: 0-No pain    Therapy/Group: Individual Therapy  Jasani Lengel 05/01/2023, 12:27 PM

## 2023-05-01 NOTE — Progress Notes (Signed)
PROGRESS NOTE   Subjective/Complaints:  Pt feeling much better today, up and working with therapies! Denies pain, less fatigued than yesterday. Slept "decent enough". LBM 2 days ago, but didn't eat much yesterday or the last couple days; ate breakfast well this morning. Normal for him to not have a BM daily.  Urinating ok, no further incontinence.  Denies any other complaints or concerns.   ROS: as per HPI. Denies CP, SOB, abd pain, n/v, diarrhea, constipation, or any other complaints at this time.   + cough- improving +Fatigue-improving   Objective:   No results found.  Recent Labs    04/28/23 1708 04/30/23 0552  WBC 8.8 8.6  HGB 12.3* 12.6*  HCT 37.7* 38.0*  PLT 259 265    Recent Labs    04/28/23 1708 04/30/23 0552  NA 133* 132*  K 4.2 4.3  CL 100 99  CO2 20* 20*  GLUCOSE 164* 176*  BUN 13 11  CREATININE 1.24 1.11  CALCIUM 9.1 9.1     Intake/Output Summary (Last 24 hours) at 05/01/2023 1114 Last data filed at 04/30/2023 2008 Gross per 24 hour  Intake 973.08 ml  Output --  Net 973.08 ml         Physical Exam: Vital Signs Blood pressure (!) 143/72, pulse 87, temperature 97.8 F (36.6 C), resp. rate 16, height 6' (1.829 m), weight 101.9 kg, SpO2 97%.    Physical Exam General: No acute distress, getting up in bed to work with PT.  Vital signs reviewed Mood and affect are appropriate Heart: Regular rate and rhythm no rubs murmurs or extra sounds Chest - s/p right masectomy  Lungs: Clear to auscultation, breathing unlabored, no rales or wheezes, on RA Abdomen: Positive bowel sounds, soft nontender to palpation, nondistended Extremities: No clubbing, cyanosis, or edema Skin: No evidence of breakdown, no evidence of rash over exposed surfaces  PRIOR EXAMS: Neurologic: Cranial nerves II through XII intact, motor strength is 5/5 in bilateral deltoid, bicep, tricep, grip, hip flexor, knee  extensors, ankle dorsiflexor and plantar flexor  Cerebellar exam normal finger to nose to finger as well as heel to shin in bilateral upper and lower extremities  Exam Stable 1/17 Musculoskeletal: Full range of motion in all 4 extremities. No joint swelling     Assessment/Plan: 1. Functional deficits which require 3+ hours per day of interdisciplinary therapy in a comprehensive inpatient rehab setting. Physiatrist is providing close team supervision and 24 hour management of active medical problems listed below. Physiatrist and rehab team continue to assess barriers to discharge/monitor patient progress toward functional and medical goals  Care Tool:  Bathing    Body parts bathed by patient: Right arm, Left arm, Chest, Abdomen, Front perineal area, Right upper leg, Left upper leg, Face, Buttocks, Left lower leg, Right lower leg   Body parts bathed by helper: Buttocks, Left lower leg, Right lower leg     Bathing assist Assist Level: Minimal Assistance - Patient > 75%     Upper Body Dressing/Undressing Upper body dressing   What is the patient wearing?: Pull over shirt    Upper body assist Assist Level: Supervision/Verbal cueing    Lower Body Dressing/Undressing Lower  body dressing      What is the patient wearing?: Pants     Lower body assist Assist for lower body dressing: Minimal Assistance - Patient > 75%     Toileting Toileting Toileting Activity did not occur (Clothing management and hygiene only): N/A (no void or bm)  Toileting assist Assist for toileting: Minimal Assistance - Patient > 75%     Transfers Chair/bed transfer  Transfers assist     Chair/bed transfer assist level: Minimal Assistance - Patient > 75%     Locomotion Ambulation   Ambulation assist      Assist level: Minimal Assistance - Patient > 75% Assistive device: Walker-rolling Max distance: 58   Walk 10 feet activity   Assist     Assist level: Minimal Assistance - Patient  > 75% Assistive device: Walker-rolling   Walk 50 feet activity   Assist    Assist level: Minimal Assistance - Patient > 75% Assistive device: Walker-rolling    Walk 150 feet activity   Assist Walk 150 feet activity did not occur: Safety/medical concerns  Assist level: Minimal Assistance - Patient > 75% Assistive device: Walker-rolling    Walk 10 feet on uneven surface  activity   Assist Walk 10 feet on uneven surfaces activity did not occur: Safety/medical concerns         Wheelchair     Assist Is the patient using a wheelchair?: Yes (for transport b/c of impaired activity tolerance limiting gait distance) Type of Wheelchair: Manual    Wheelchair assist level: Dependent - Patient 0%      Wheelchair 50 feet with 2 turns activity    Assist        Assist Level: Dependent - Patient 0%   Wheelchair 150 feet activity     Assist      Assist Level: Dependent - Patient 0%   Blood pressure (!) 143/72, pulse 87, temperature 97.8 F (36.6 C), resp. rate 16, height 6' (1.829 m), weight 101.9 kg, SpO2 97%.  Medical Problem List and Plan: 1. 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  Appears fatigue may be more of a chronic issue -05/01/23 fatigue a bit better today, doesn't feel he'll be ready to go this week d/t set back from illness, advised discussing with weekday team 2.  Antithrombotics: -DVT/anticoagulation:  Mechanical: Antiembolism stockings, thigh (TED hose) Bilateral lower extremities -antiplatelet therapy: Aspirin 81 mg daily and Plavix 75 mg daily x 3 weeks then Plavix alone 3. Pain Management: Tylenol as needed 4. Mood/Behavior/Sleep: Not emotional support             -antipsychotic  agents: N/A -1/17 Suspect his fatigue may be mood related, consider starting SSRI tomorrow if he is agreeable-- 1/18 +metapneumovirus on resp panel, likely culprit for increased fatigue; defer SSRI decision to after he's improved from this illness. 5. Neuropsych/cognition: This patient is capable of making decisions on his own behalf. 6. Skin/Wound Care: Routine skin checks 7. Fluids/Electrolytes/Nutrition: Routine in and outs with follow-up chemistries 8.  Hypertension.  Norvasc 5 mg daily.  Monitor with increased mobility  -1/11 intermittently elevated continue to  monitor -04/24/23 BPs fine, cont regimen; of note, pharmacy stated he was on furosemide 80mg  daily but down to 40mg  daily on inpatient, but not resumed here  Increase amlodipine to 10mg  on 1/14 monitor  - overall improved ,mildly elevated systolic this am  -05/01/23 BP stable continue current regimen  Vitals:   04/28/23 1953 04/29/23 0803 04/29/23 1414 04/29/23 1933  BP: 135/65 (!) 155/67 137/70 (!) 155/66   04/30/23 0054 04/30/23 0642 04/30/23 0907 04/30/23 1249  BP: (!) 149/67 (!) 154/50 (!) 107/59 119/77   04/30/23 1652 04/30/23 2000 05/01/23 0044 05/01/23 0457  BP: 124/63 138/60 127/65 (!) 143/72     9.  Diabetes mellitus peripheral neuropathy.  Latest hemoglobin A1c 12.3.  Currently on NovoLog 70/30 22 units twice daily.  Diabetic teaching.  Patient's home regimen of Glucophage 1000 mg twice daily as well as Toujeo 15 units in the morning and 70 units in the evening-- currently held.  Resume as needed 1/11 CBGs mildly elevated, restart metformin XR at 500 mg dose, continue current dose of 70/30 for now -04/24/23 CBGs in the 200s, recent med adjustment, monitor for trend with that change, but suspect he'll likely need further adjustments -Increase novalog 70/30 to 22U BID first dose 1/13 pm , improved CBG this am , may be able to d/c metformin  -1/17 CBGs controlled continue current -04/30/23 CBGs fine, not eating much, SSI d/c'd  for now; getting IVFs today; monitor for now, but suspect CBGs will be less d/t poor PO intake -05/01/23 CBGs ok, eating better, will restart SSI today, off fluids.   CBG (last 3)  Recent Labs    04/30/23 1644 04/30/23 2019 05/01/23 0539  GLUCAP 216* 150* 129*   10.  Chronic vertigo.  Continue Antivert 12.5 mg 3 times daily prn.  Patient followed outpatient by ENT   11.  BPH.  Proscar 5 mg daily, Flomax 0.4 mg daily.  Check PVR.  Followed by urology Dr. Tobie Lords [facility]  -1/11 continent continue current regimen  -04/24/23 no PVRs documented but continent, cont regimen   12.  History of breast cancer.  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  Status post modified radical mastectomy and chemotherapy.  Patient on tamoxifen 20mg  daily since April 2020 follow-up with outpatient oncology Dr Allison Quarry Vallathucherry and resume tamoxifen as outpatient.   13.  GERD.  Protonix 40mg  daily   14.  AKI on CKD.  Creatinine baseline 1.29-1.64.  Follow-up chemistries  -1/16 BUN and creatinine stable at 13/1.24   -04/30/23 Cr 1.11 today, getting fluids today d/t poor PO intake  15.  Obesity.  BMI 30.45.  Dietary follow-up  16. Constipation: on miralax BID- large cont liquid BM this am  -04/24/23 reports LBM was yesterday or two days ago, no documentation of BMs on file-- also refusing miralax; if no BM by tomorrow, would consider encouraging pt to use miralax, or using alternative bowel regimen that he'd be willing to use.  -05/01/23 LBM 2 days ago but poor PO intake last couple days, monitor for now  17. Cough/nausea/Metapneumovirus- on droplet precautions  -CXR ordered-no acute changes  -WBC within normal limits on CBC 1/16 and 1/18   -1/17 respiratory panel is pending  -Mucinex ordered -04/30/23 resp panel showing +Metapneumovirus, likely culprit for his fatigue and low grade temps; cont supportive care, getting IVFs, might miss therapies today d/t not feeling well. Zofran ordered.  -05/01/23  doing much better today, monitor    18.  Mildly elevated LFTs  -Monitor Tylenol  usage, recheck Monday    LOS: 9 days A FACE TO FACE EVALUATION WAS PERFORMED  472 John [name] 05/01/2023, 11:14 AM

## 2023-05-01 NOTE — Progress Notes (Signed)
Physical Therapy Session Note  Patient Details  Name: John [name] MRN: [id number] Date of Birth: 1946/02/12  Today's Date: 05/01/2023 PT Individual Time: 0900-0945 PT Individual Time Calculation (min): 45 min   Short Term Goals: Week 1:  PT Short Term Goal 1 (Week 1): Pt will perform sit<>stands using LRAD with CGA PT Short Term Goal 2 (Week 1): Pt will perform bed<>chair transfers using LRAD with CGA PT Short Term Goal 3 (Week 1): Pt will ambulate at least 167ft using LRAD with CGA PT Short Term Goal 4 (Week 1): Pt will navigate at least 8 steps using HRs with CGA  Skilled Therapeutic Interventions/Progress Updates: Pt presents semi-reclined and "feeling better than when I saw you last."  Pt agreeable to therapy.  THT placed w/ total A in supine although will adjust once standing.  Pt transfers sup to sit w/ supervision w/ HOB elevated.  Pt transferred sit to stand w/ mod A from low height bed and then required max A to maintain upright position (retropulsing w/o any effort to assist) while pulling down pants to pull TEDS all the way onto thighs and then min A to complete clothing management over buttocks.  Pt then standing w/ min/CGA and RW.  Pt amb x 5' to w/c, cues for hand placement for stand to sit and completing turn.  Pt wheeled to dayroom for energy conservation.  Pt transfers sit to stand w/ mod A.  Pt amb x58' w/ min A and encouragement.  Pt requires seated rest breaks 2/2 fatigue.  Pt transferred sit to stand in hallway to amb into room but needed to sit 2/2 c/o weakness.  Pt rested and then amb into room to bed w/ min A and cues for complete turns for safe stand to sit.  Pt transferred sit to supine w/ supervision.  Bed alarm on and all needs in reach.     Therapy Documentation Precautions:  Precautions Precautions: Fall, Other (comment) Precaution Comments: Chronic vertigo; HOH Restrictions Weight Bearing Restrictions Per Provider Order: No General:   Vital Signs:    Pain:0/10      Therapy/Group: Individual Therapy  [name] 05/01/2023, 9:48 AM

## 2023-05-02 DIAGNOSIS — I6389 Other cerebral infarction: Secondary | ICD-10-CM | POA: Diagnosis not present

## 2023-05-02 DIAGNOSIS — F54 Psychological and behavioral factors associated with disorders or diseases classified elsewhere: Secondary | ICD-10-CM | POA: Diagnosis not present

## 2023-05-02 DIAGNOSIS — I10 Essential (primary) hypertension: Secondary | ICD-10-CM | POA: Diagnosis not present

## 2023-05-02 DIAGNOSIS — I63511 Cerebral infarction due to unspecified occlusion or stenosis of right middle cerebral artery: Secondary | ICD-10-CM | POA: Diagnosis not present

## 2023-05-02 DIAGNOSIS — L89301 Pressure ulcer of unspecified buttock, stage 1: Secondary | ICD-10-CM | POA: Diagnosis not present

## 2023-05-02 LAB — COMPREHENSIVE METABOLIC PANEL
ALT: 36 U/L (ref 0–44)
AST: 48 U/L — ABNORMAL HIGH (ref 15–41)
Albumin: 2.5 g/dL — ABNORMAL LOW (ref 3.5–5.0)
Alkaline Phosphatase: 41 U/L (ref 38–126)
Anion gap: 12 (ref 5–15)
BUN: 13 mg/dL (ref 8–23)
CO2: 19 mmol/L — ABNORMAL LOW (ref 22–32)
Calcium: 9 mg/dL (ref 8.9–10.3)
Chloride: 101 mmol/L (ref 98–111)
Creatinine, Ser: 1 mg/dL (ref 0.61–1.24)
GFR, Estimated: >60 mL/min (ref >60)
Glucose, Bld: 115 mg/dL — ABNORMAL HIGH (ref 70–99)
Potassium: 4.7 mmol/L (ref 3.5–5.1)
Sodium: 132 mmol/L — ABNORMAL LOW (ref 135–145)
Total Bilirubin: 1.7 mg/dL — ABNORMAL HIGH (ref 0.0–1.2)
Total Protein: 5.5 g/dL — ABNORMAL LOW (ref 6.5–8.1)

## 2023-05-02 LAB — CBC
HCT: 37.9 % — ABNORMAL LOW (ref 39.0–52.0)
Hemoglobin: 12.3 g/dL — ABNORMAL LOW (ref 13.0–17.0)
MCH: 27 pg (ref 26.0–34.0)
MCHC: 32.5 g/dL (ref 30.0–36.0)
MCV: 83.3 fL (ref 80.0–100.0)
Platelets: 296 10*3/uL (ref 150–400)
RBC: 4.55 MIL/uL (ref 4.22–5.81)
RDW: 15.4 % (ref 11.5–15.5)
WBC: 7 10*3/uL (ref 4.0–10.5)
nRBC: 0 % (ref 0.0–0.2)

## 2023-05-02 LAB — GLUCOSE, CAPILLARY
Glucose-Capillary: 120 mg/dL — ABNORMAL HIGH (ref 70–99)
Glucose-Capillary: 157 mg/dL — ABNORMAL HIGH (ref 70–99)
Glucose-Capillary: 131 mg/dL — ABNORMAL HIGH (ref 70–99)
Glucose-Capillary: 173 mg/dL — ABNORMAL HIGH (ref 70–99)

## 2023-05-02 MED ORDER — BENZONATATE 100 MG PO CAPS
100.0000 mg | ORAL_CAPSULE | Freq: Three times a day (TID) | ORAL | Status: DC | PRN
  Administered 2023-05-02 – 2023-05-03 (×3): 100 mg via ORAL
  Filled 2023-05-02 (×3): qty 1

## 2023-05-02 NOTE — Consult Note (Signed)
Neuropsychological Consultation Comprehensive Inpatient Rehab   Patient:   John [name]   DOB:   [birth year]  MR Number:  [id number]  Location:  [facility] [address] CENTER [address] [place] [address] Dept: [phone] Loc: [phone]           Date of Service:   05/02/2023  Start Time:   11 AM End Time:   12 PM  Provider/Observer:  Arley Phenix, Psy.D.       Clinical Neuropsychologist       Billing Code/Service: [phone]  Reason for Service:    John [name] is a 78 year old right-handed male with past medical history including chronic vertigo, insulin-dependent diabetes with peripheral neuropathy, chronic kidney disease stage II, hypertension, hyperlipidemia, BPH, obesity, right breast cancer with mastectomy as well as follow-up chemotherapy.  Patient has been followed by ENT and vestibular rehab prior to his chronic vertigo.  Patient presented on 04/16/2023 after being seen initially by his PCP due to shortness of breath as well as dry heaving and dizziness with nausea and inability to tolerate food intake.  Initial cranial CT did not show any acute abnormalities.  Follow-up MRI showed small acute infarct in the right middle cerebellar peduncle.  Patient continued with decreased functional mobility and was admitted to CIR due to motor deficits with coordination issues.  During today's clinical visit the patient reiterated that he did not feel he was quite ready to be discharged with continued motor deficits and wanting to improve before he is discharged.  This issue has been reviewed by attending and it looks like they may be extending his stay due to ongoing issues but improvement happening.  Patient acknowledges anxiety and coping difficulties with recent loss of function.  HPI for the current admission:    HPI: John [name] is a 78 year old right-handed male with history significant for chronic vertigo followed  by ENT and did try vestibular rehab in the past but did not tolerate, insulin-dependent diabetes mellitus with peripheral neuropathy, 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 Per chart review patient lives with spouse. Two-level home bed and bath upstairs 3 steps to entry. Independent prior to admission. Presented 04/16/2023 with recent follow-up with PCP for shortness of breath as well as dry heaving/dizziness with nausea and inability to tolerate p.o. Cranial CT scan showed no acute abnormalities. Patient did not receive tPA. 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 CTA head and neck with no large vessel occlusion or flow-limiting proximal intracranial stenosis. 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 50% stenosis of the left vertebral artery origin due to soft and calcified plaque. Admission chemistries unremarkable except sodium 134 chloride 94 glucose 407 BUN 31 creatinine 1.34, elevated betahydroxybutyrate level at 1.85, which improved with IV fluid, hemoglobin A1c of 12.4 04/11/2023. Echocardiogram ejection fraction of 60 to 65% no wall motion abnormalities grade 1 diastolic dysfunction. Neurology follow-up placed on low-dose aspirin 81 mg daily and Plavix 75 mg daily for CVA prophylaxis x 3 weeks then Plavix alone. Hospital course AKI on CKD improved with gentle IV fluids labs creatinine 1.22. Tolerating a regular consistency diet. Therapy evaluations completed due to patient decreased functional mobility was admitted for a comprehensive rehab program.   Medical History:   Past Medical History:  Diagnosis Date   BPH (benign prostatic hyperplasia)  CTS (carpal tunnel syndrome)    Depression    Diabetes mellitus    Essential hypertension    GERD (gastroesophageal reflux disease)    History of breast cancer     Hypercholesteremia    Neuropathy          Patient Active Problem List   Diagnosis Date Noted   Coping style affecting medical condition 05/02/2023   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) 04/28/2023   Brainstem infarct, acute (HCC) 04/25/2023   Pressure injury of skin 04/25/2023   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   Acute kidney injury 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) 04/17/2023   DKA (diabetic ketoacidosis) (HCC) 04/16/2023   Type 2 diabetes mellitus with hyperglycemia, with long-term current use of insulin (HCC) 07/26/2022   Bronchitis 03/20/2021   Acute non-recurrent frontal sinusitis 03/20/2021   Viral URI with cough 01/13/2021   Hyperlipidemia 11/21/2020   Chronic vertigo 10/20/2020   Localized swelling of both lower extremities 10/20/2020   Tendinitis of right hip flexor 10/20/2020   Aortic atherosclerosis (HCC) 09/10/2020   Insomnia 09/10/2020   Cognitive impairment 08/18/2020   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   Increasing residual urine 06/04/2020   History of COVID-19 12/10/2019   Physical deconditioning 12/10/2019   Fatigue 12/10/2019   Unsteady gait 12/10/2019   Pneumonia due to COVID-19 virus 10/26/2019   Peripheral neuropathy 10/17/2019   Essential hypertension 10/17/2019   Depression, major, single episode, 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/2019   History of breast cancer    GERD (gastroesophageal reflux disease)    BPH (benign prostatic hyperplasia)    CTS (carpal tunnel syndrome)    Diabetic polyneuropathy associated with type 2 diabetes mellitus (HCC) 09/29/2019   Neuropathic pain of foot 09/29/2019   Port-A-Cath in place 01/03/2019   Peripheral sensory neuropathy 05/17/2018   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   Hypomagnesemia 01/18/2018   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) 12/13/2017   Axillary adenopathy 12/09/2017   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   S/P cataract extraction and insertion of intraocular lens 01/17/2017   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 his dress was Appropriate and he was Well Groomed and his manners were Appropriate to the situation.  his participation was indicative of Appropriate behaviors.  There were physical disabilities noted.  he displayed an appropriate level of cooperation and motivation.    Interactions:    Active Appropriate  Attention:   within normal limits and attention span and concentration were age appropriate  Memory:   within normal limits; recent and remote memory intact  Visuo-spatial:   not examined  Speech (Volume):  normal  Speech:   normal; normal  Thought Process:  Coherent and Relevant  Coherent and Logical  Though Content:  WNL; not suicidal and not homicidal  Orientation:   person, place, time/date, and situation  Judgment:   Good  Planning:   Fair  Affect:    Appropriate  Mood:    Anxious and Dysphoric  Insight:   Good  Intelligence:   normal  Psychiatric History:  No prior psychiatric history   Family Med/Psych History:  Family History  Problem Relation Age of Onset   Lymphoma Mother    Cancer Mother    Diabetes Father    Stroke Father    Ovarian cancer Sister    Cancer Sister    Cancer Paternal Grandmother      Impression/DX:   Zayin Clendenning is a 78 year old right-handed male with past medical history including chronic vertigo, insulin-dependent diabetes with peripheral neuropathy, chronic kidney disease stage II, hypertension, hyperlipidemia, BPH, obesity, right breast cancer with mastectomy as well as follow-up chemotherapy.  Patient has been followed by ENT and vestibular rehab prior to his chronic vertigo.  Patient presented on 04/16/2023 after being seen initially by his PCP due to shortness of breath as well as dry heaving and dizziness with nausea and inability to tolerate food intake.  Initial cranial CT did not show any acute abnormalities.  Follow-up MRI showed small acute infarct in the right middle cerebellar peduncle.  Patient continued with decreased functional mobility and was admitted to CIR due to motor deficits with coordination issues.  During today's clinical visit the patient reiterated that he did not feel he was quite ready to be discharged with continued motor deficits and wanting to improve before he is discharged.  This issue has been reviewed by attending and it looks like they may be extending his stay due to ongoing issues but improvement happening.  Patient acknowledges anxiety and coping difficulties with recent loss of function.  Disposition/Plan:  Today and worked on coping and adjustment issues particularly with dealing with ongoing motor and coordination issues with planned discharge in the near future and concerned about how he will be able to manage once discharged.  Diagnosis:    Coping styles affecting medical status         Electronically Signed   _______________________ Arley Phenix, Psy.D. Clinical Neuropsychologist

## 2023-05-02 NOTE — Progress Notes (Signed)
PROGRESS NOTE   Subjective/Complaints:  Discussed pt's URI, pt feels fatigue, not coughing.  Discussed/encouraged use of flutter valve   ROS: as per HPI. Denies CP, SOB, abd pain, n/v, diarrhea, constipation, or any other complaints at this time.   + cough- improving +Fatigue-improving   Objective:   No results found.  Recent Labs    04/30/23 0552 05/02/23 0456  WBC 8.6 7.0  HGB 12.6* 12.3*  HCT 38.0* 37.9*  PLT 265 296    Recent Labs    04/30/23 0552 05/02/23 0456  NA 132* 132*  K 4.3 4.7  CL 99 101  CO2 20* 19*  GLUCOSE 176* 115*  BUN 11 13  CREATININE 1.11 1.00  CALCIUM 9.1 9.0     Intake/Output Summary (Last 24 hours) at 05/02/2023 0911 Last data filed at 05/02/2023 0727 Gross per 24 hour  Intake 1373 ml  Output --  Net 1373 ml         Physical Exam: Vital Signs Blood pressure (!) 141/72, pulse 79, temperature 98.4 F (36.9 C), resp. rate 16, height 6' (1.829 m), weight 101.9 kg, SpO2 94%. Physical Exam General: No acute distress, getting up in bed to work with PT.  Vital signs reviewed Mood and affect are appropriate Heart: Regular rate and rhythm no rubs murmurs or extra sounds Chest - s/p right masectomy  Lungs: Clear to auscultation, breathing unlabored, no rales or wheezes, on RA Abdomen: Positive bowel sounds, soft nontender to palpation, nondistended Extremities: No clubbing, cyanosis, or edema Skin: No evidence of breakdown, no evidence of rash over exposed surfaces  PRIOR EXAMS: Neurologic: Cranial nerves II through XII intact, motor strength is 5/5 in bilateral deltoid, bicep, tricep, grip, hip flexor, knee extensors, ankle dorsiflexor and plantar flexor  Cerebellar exam normal finger to nose to finger as well as heel to shin in bilateral upper and lower extremities  Musculoskeletal: Full range of motion in all 4 extremities. No joint swelling     Assessment/Plan: 1.  Functional deficits which require 3+ hours per day of interdisciplinary therapy in a comprehensive inpatient rehab setting. Physiatrist is providing close team supervision and 24 hour management of active medical problems listed below. Physiatrist and rehab team continue to assess barriers to discharge/monitor patient progress toward functional and medical goals  Care Tool:  Bathing    Body parts bathed by patient: Right arm, Left arm, Chest, Abdomen, Front perineal area, Right upper leg, Left upper leg, Face, Buttocks, Left lower leg, Right lower leg   Body parts bathed by helper: Buttocks, Left lower leg, Right lower leg     Bathing assist Assist Level: Minimal Assistance - Patient > 75%     Upper Body Dressing/Undressing Upper body dressing   What is the patient wearing?: Pull over shirt    Upper body assist Assist Level: Supervision/Verbal cueing    Lower Body Dressing/Undressing Lower body dressing      What is the patient wearing?: Pants     Lower body assist Assist for lower body dressing: Minimal Assistance - Patient > 75%     Toileting Toileting Toileting Activity did not occur (Clothing management and hygiene only): N/A (no void or bm)  Toileting assist Assist for toileting: Minimal Assistance - Patient > 75%     Transfers Chair/bed transfer  Transfers assist     Chair/bed transfer assist level: Minimal Assistance - Patient > 75%     Locomotion Ambulation   Ambulation assist      Assist level: Minimal Assistance - Patient > 75% Assistive device: Walker-rolling Max distance: 58   Walk 10 feet activity   Assist     Assist level: Minimal Assistance - Patient > 75% Assistive device: Walker-rolling   Walk 50 feet activity   Assist    Assist level: Minimal Assistance - Patient > 75% Assistive device: Walker-rolling    Walk 150 feet activity   Assist Walk 150 feet activity did not occur: Safety/medical concerns  Assist level:  Minimal Assistance - Patient > 75% Assistive device: Walker-rolling    Walk 10 feet on uneven surface  activity   Assist Walk 10 feet on uneven surfaces activity did not occur: Safety/medical concerns         Wheelchair     Assist Is the patient using a wheelchair?: Yes (for transport b/c of impaired activity tolerance limiting gait distance) Type of Wheelchair: Manual    Wheelchair assist level: Dependent - Patient 0%      Wheelchair 50 feet with 2 turns activity    Assist        Assist Level: Dependent - Patient 0%   Wheelchair 150 feet activity     Assist      Assist Level: Dependent - Patient 0%   Blood pressure (!) 141/72, pulse 79, temperature 98.4 F (36.9 C), resp. rate 16, height 6' (1.829 m), weight 101.9 kg, SpO2 94%.  Medical Problem List and Plan: 1. 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  Appears fatigue may be more of a chronic issue -05/01/23 fatigue a bit better today, doesn't feel he'll be ready to go this week d/t set back from illness, advised discussing with weekday team 2.  Antithrombotics: -DVT/anticoagulation:  Mechanical: Antiembolism stockings, thigh (TED hose) Bilateral lower extremities -antiplatelet therapy: Aspirin 81 mg daily and Plavix 75 mg daily x 3 weeks then Plavix alone 3. Pain Management: Tylenol as needed 4. Mood/Behavior/Sleep: Not emotional support             -antipsychotic agents: N/A -1/17 Suspect his fatigue may be mood related, consider starting SSRI tomorrow if he is agreeable-- 1/18 +metapneumovirus on resp panel, likely culprit for increased fatigue; defer SSRI decision to after he's improved from this illness. 5. Neuropsych/cognition: This patient  is capable of making decisions on his own behalf. 6. Skin/Wound Care: Routine skin checks 7. Fluids/Electrolytes/Nutrition: Routine in and outs with follow-up chemistries 8.  Hypertension.  Norvasc 5 mg daily.  Monitor with increased mobility  -1/11 intermittently elevated continue to monitor -04/24/23 BPs fine, cont regimen; of note, pharmacy stated he was on furosemide 80mg  daily but down to 40mg  daily on inpatient, but not resumed here  Increase amlodipine to 10mg  on 1/14 monitor  - overall improved ,mildly elevated systolic this am  -05/02/23 BP stable continue current regimen  Vitals:  04/29/23 1933 04/30/23 0054 04/30/23 0642 04/30/23 0907  BP: (!) 155/66 (!) 149/67 (!) 154/50 (!) 107/59   04/30/23 1249 04/30/23 1652 04/30/23 2000 05/01/23 0044  BP: 119/77 124/63 138/60 127/65   05/01/23 0457 05/01/23 1446 05/01/23 1945 05/02/23 0421  BP: (!) 143/72 128/65 124/61 (!) 141/72     9.  Diabetes mellitus peripheral neuropathy.  Latest hemoglobin A1c 12.3.  Currently on NovoLog 70/30 22 units twice daily.  Diabetic teaching.  Patient's home regimen of Glucophage 1000 mg twice daily as well as Toujeo 15 units in the morning and 70 units in the evening-- currently held.  Resume as needed 1/11 CBGs mildly elevated, restart metformin XR at 500 mg dose, continue current dose of 70/30 for now -04/24/23 CBGs in the 200s, recent med adjustment, monitor for trend with that change, but suspect he'll likely need further adjustments -Increase novalog 70/30 to 22U BID first dose 1/13 pm , improved CBG this am , may be able to d/c metformin  -1/17 CBGs controlled continue current -04/30/23 CBGs fine, not eating much, SSI d/c'd for now; getting IVFs today; monitor for now, but suspect CBGs will be less d/t poor PO intake -05/02/23 CBGs controlled   CBG (last 3)  Recent Labs    05/01/23 1607 05/01/23 2039 05/02/23 0553  GLUCAP 103* 76 120*   10.  Chronic vertigo.  Continue Antivert 12.5 mg 3 times  daily prn.  Patient followed outpatient by ENT   11.  BPH.  Proscar 5 mg daily, Flomax 0.4 mg daily.  Check PVR.  Followed by urology Dr. Tobie Lords [facility]  -1/11 continent continue current regimen  -04/24/23 no PVRs documented but continent, cont regimen   12.  History of breast cancer.  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  Status post modified radical mastectomy and chemotherapy.  Patient on tamoxifen 20mg  daily since April 2020 follow-up with outpatient oncology Dr Allison Quarry Vallathucherry  Have resumed tamoxifen  13.  GERD.  Protonix 40mg  daily   14.  AKI on CKD.  Creatinine baseline 1.29-1.64.  Follow-up chemistries  -    Latest Ref Rng & Units 05/02/2023    4:56 AM 04/30/2023    5:52 AM 04/28/2023    5:08 PM  BMP  Glucose 70 - 99 mg/dL 621  308  657   BUN 8 - 23 mg/dL 13  11  13    Creatinine 0.61 - 1.24 mg/dL 8.46  9.62  9.52   Sodium 135 - 145 mmol/L 132  132  133   Potassium 3.5 - 5.1 mmol/L 4.7  4.3  4.2   Chloride 98 - 111 mmol/L 101  99  100   CO2 22 - 32 mmol/L 19  20  20    Calcium 8.9 - 10.3 mg/dL 9.0  9.1  9.1      15.  Obesity.  BMI 30.45.  Dietary follow-up  16. Constipation: on miralax BID- large cont liquid BM this am  -04/24/23 reports LBM was yesterday or two days ago, no documentation of BMs on file-- also refusing miralax; if no BM by tomorrow, would consider encouraging pt to use miralax, or using alternative bowel regimen that he'd be willing to use.  -05/01/23 LBM 2 days ago but poor PO intake last couple days, monitor for now  17. Cough/nausea/Metapneumovirus- on droplet precautions  -CXR ordered-no acute changes  -WBC within normal limits on CBC 1/16 and 1/18   -1/17 respiratory panel is pending- had low grade temp 1/17 and 1/18 now  resolved   Encourage use of flutter valve - discussed lack of specific antiviral treatment     18.  Mildly elevated LFTs  -Monitor Tylenol usage, recheck Monday    LOS: 10 days A FACE TO FACE EVALUATION WAS  PERFORMED  Erick Colace 05/02/2023, 9:11 AM

## 2023-05-02 NOTE — Progress Notes (Signed)
Physical Therapy Session Note  Patient Details  Name: John [name] MRN: [id number] Date of Birth: 13-Apr-1945  Today's Date: 05/02/2023 PT Individual Time: 5643-3295 PT Individual Time Calculation (min): 71 min   Short Term Goals: Week 1:  PT Short Term Goal 1 (Week 1): Pt will perform sit<>stands using LRAD with CGA PT Short Term Goal 2 (Week 1): Pt will perform bed<>chair transfers using LRAD with CGA PT Short Term Goal 3 (Week 1): Pt will ambulate at least 111ft using LRAD with CGA PT Short Term Goal 4 (Week 1): Pt will navigate at least 8 steps using HRs with CGA  Skilled Therapeutic Interventions/Progress Updates:     Pt received semi reclined in bed and agrees to therapy, though reports fatigue. PT provides frequent and extended rest breaks to manage energy levels. Pt performs supine to sit with minA and cues for positioning at EOB. Pt performs sit to stand and stand step transfer to [facility] with CGA and cues for sequencing and positioning. WC transport to gym. Pt performs sit to stand with RW and cues for initiation. Pt ambulates x175' with RW and several standing rest breaks, with cues to posture to improve balance, and increasing eccentric control of stand to sit for safety and strengthening. Following rest break, pt stands and ambulates x60' without AD, with minA and cues to increase stride length to decrease risk for falls. Seated rest break. Pt encouraged to ambulate again without RW but pt insists on using RW. Pt ambulates x175' with same assistance and cues, and does not require any standing rest breaks. Following rest break, pt ambulates back to room, x150', with same assistance and cues. Left supine in bed with alarm intact and all needs within reach.   Therapy Documentation Precautions:  Precautions Precautions: Fall, Other (comment) Precaution Comments: Chronic vertigo; HOH Restrictions Weight Bearing Restrictions Per Provider Order: No  Therapy/Group: Individual  Therapy  Beau Fanny, PT, DPT 05/02/2023, 4:34 PM

## 2023-05-02 NOTE — Progress Notes (Signed)
Occupational Therapy Weekly Progress Note  Patient Details  Name: John [name] MRN: [id number] Date of Birth: 03-Apr-1946  Beginning of progress report period: April 23, 2023 End of progress report period: May 02, 2023  Today's Date: 05/02/2023 OT Individual Time: 1435-1530 OT Individual Time Calculation (min): 55 min    Patient continues to make slow progress towards OT POC, limited during this reporting period by respiratory illness/congestion. Pt currently functioning at UB superivsion level, LB CGA-min A level, and toileting with min A using RW. Conversation about caregiver education initiated.   Patient continues to demonstrate the following deficits: muscle weakness, decreased cardiorespiratoy endurance, unbalanced muscle activation, decreased coordination, and decreased motor planning, decreased awareness, decreased problem solving, decreased safety awareness, and decreased memory, and decreased standing balance, decreased postural control, and decreased balance strategies and therefore will continue to benefit from skilled OT intervention to enhance overall performance with BADL and Reduce care partner burden.  Patient progressing toward long term goals..  Continue plan of care.  OT Short Term Goals Week 1:  OT Short Term Goal 1 (Week 1): STGs=LTGs due patient's estimated length of stay. OT Short Term Goal 1 - Progress (Week 1): Progressing toward goal Week 2:  OT Short Term Goal 1 (Week 2): STGs=LTGs due patient's estimated length of stay.  Skilled Therapeutic Interventions/Progress Updates:  Pt greeted resting in bed for skilled OT session with focus on OOB activity, discharge planning, and walk-in shower transfer. Pt requires increased time/effort to encourage participation in session. Pt needs re-direction from detailing extensive medical history to clinician, although time provided to discuss stressors in regards to upcoming discharge. Pt eventually comes to EOB with  supervision + bed rail, walk-in shower simulated, transfer completed with CGA + RW, Min-light Mod (HHA) for exit as patient does not think he could fit RW in 1st floor bathroom. Time dedicated to initiating scheduling of caregiver education.   Pt remained resting in bed with 4Ps assessed and immediate needs met. Pt continues to be appropriate for skilled OT intervention to promote further functional independence in ADLs/IADLs.   Therapy Documentation Precautions:  Precautions Precautions: Fall, Other (comment) Precaution Comments: Chronic vertigo; HOH Restrictions Weight Bearing Restrictions Per Provider Order: No   Therapy/Group: Individual Therapy  Lou Cal, OTR/L, MSOT  05/02/2023, 6:32 AM

## 2023-05-02 NOTE — Progress Notes (Signed)
This morning complained of not sleeping good. Continues with intermittent cough, NP, audible wheeze with activity. Dizzy this morning, PRN antivert given at 0654. Alfredo Martinez A

## 2023-05-02 NOTE — Progress Notes (Signed)
Reports feeling a lot better. Still having coughing fits. PRN tylenol given @ 2028. HS CBG=73, patient ate peanut butter and crackers with 1/2 cup of coke. Appetite still poor.. Continent of B & B. BM today, per report. John [name] A

## 2023-05-03 DIAGNOSIS — I10 Essential (primary) hypertension: Secondary | ICD-10-CM | POA: Diagnosis not present

## 2023-05-03 DIAGNOSIS — I63511 Cerebral infarction due to unspecified occlusion or stenosis of right middle cerebral artery: Secondary | ICD-10-CM | POA: Diagnosis not present

## 2023-05-03 DIAGNOSIS — L89301 Pressure ulcer of unspecified buttock, stage 1: Secondary | ICD-10-CM | POA: Diagnosis not present

## 2023-05-03 LAB — GLUCOSE, CAPILLARY
Glucose-Capillary: 114 mg/dL — ABNORMAL HIGH (ref 70–99)
Glucose-Capillary: 107 mg/dL — ABNORMAL HIGH (ref 70–99)
Glucose-Capillary: 169 mg/dL — ABNORMAL HIGH (ref 70–99)
Glucose-Capillary: 77 mg/dL (ref 70–99)

## 2023-05-03 MED ORDER — GUAIFENESIN 100 MG/5ML PO LIQD
10.0000 mL | Freq: Four times a day (QID) | ORAL | Status: DC | PRN
  Administered 2023-05-03 – 2023-05-07 (×5): 10 mL via ORAL
  Filled 2023-05-03 (×5): qty 10

## 2023-05-03 MED ORDER — ASPIRIN 81 MG PO TBEC: DELAYED_RELEASE_TABLET | ORAL | Status: DC

## 2023-05-03 MED ORDER — BUPROPION HCL 75 MG PO TABS
75.0000 mg | ORAL_TABLET | Freq: Two times a day (BID) | ORAL | Status: DC
  Administered 2023-05-03 – 2023-05-07 (×9): 75 mg via ORAL
  Filled 2023-05-03 (×9): qty 1

## 2023-05-03 NOTE — Progress Notes (Addendum)
PROGRESS NOTE   Subjective/Complaints:  Appreciate Neuropsych note  , pt states he did not sleep well , was coughing , received tessalon Pt states he does not recall being on antidepressant in past   ROS: as per HPI. Denies CP, SOB, abd pain, n/v, diarrhea, constipation, or any other complaints at this time.   + cough- improving +Fatigue-improving   Objective:   No results found.  Recent Labs    05/02/23 0456  WBC 7.0  HGB 12.3*  HCT 37.9*  PLT 296    Recent Labs    05/02/23 0456  NA 132*  K 4.7  CL 101  CO2 19*  GLUCOSE 115*  BUN 13  CREATININE 1.00  CALCIUM 9.0     Intake/Output Summary (Last 24 hours) at 05/03/2023 0747 Last data filed at 05/02/2023 1847 Gross per 24 hour  Intake 440 ml  Output --  Net 440 ml         Physical Exam: Vital Signs Blood pressure 133/71, pulse 84, temperature 98.6 F (37 C), resp. rate 16, height 6' (1.829 m), weight 101.9 kg, SpO2 95%. Physical Exam General: No acute distress, getting up in bed to work with PT.  Vital signs reviewed Mood and affect are appropriate Heart: Regular rate and rhythm no rubs murmurs or extra sounds Chest - s/p right masectomy  Lungs: Clear to auscultation, breathing unlabored, no rales or wheezes, on RA Abdomen: Positive bowel sounds, soft nontender to palpation, nondistended Extremities: No clubbing, cyanosis, or edema Skin: No evidence of breakdown, no evidence of rash over exposed surfaces  PRIOR EXAMS: Neurologic: Cranial nerves II through XII intact, motor strength is 5/5 in bilateral deltoid, bicep, tricep, grip, hip flexor, knee extensors, ankle dorsiflexor and plantar flexor  Cerebellar exam normal finger to nose to finger as well as heel to shin in bilateral upper and lower extremities  Musculoskeletal: Full range of motion in all 4 extremities. No joint swelling     Assessment/Plan: 1. Functional deficits which  require 3+ hours per day of interdisciplinary therapy in a comprehensive inpatient rehab setting. Physiatrist is providing close team supervision and 24 hour management of active medical problems listed below. Physiatrist and rehab team continue to assess barriers to discharge/monitor patient progress toward functional and medical goals  Care Tool:  Bathing    Body parts bathed by patient: Right arm, Left arm, Chest, Abdomen, Front perineal area, Right upper leg, Left upper leg, Face, Buttocks, Left lower leg, Right lower leg   Body parts bathed by helper: Buttocks, Left lower leg, Right lower leg     Bathing assist Assist Level: Minimal Assistance - Patient > 75%     Upper Body Dressing/Undressing Upper body dressing   What is the patient wearing?: Pull over shirt    Upper body assist Assist Level: Supervision/Verbal cueing    Lower Body Dressing/Undressing Lower body dressing      What is the patient wearing?: Pants     Lower body assist Assist for lower body dressing: Minimal Assistance - Patient > 75%     Toileting Toileting Toileting Activity did not occur (Clothing management and hygiene only): N/A (no void or bm)  Toileting  assist Assist for toileting: Minimal Assistance - Patient > 75%     Transfers Chair/bed transfer  Transfers assist     Chair/bed transfer assist level: Minimal Assistance - Patient > 75%     Locomotion Ambulation   Ambulation assist      Assist level: Minimal Assistance - Patient > 75% Assistive device: Walker-rolling Max distance: 58   Walk 10 feet activity   Assist     Assist level: Minimal Assistance - Patient > 75% Assistive device: Walker-rolling   Walk 50 feet activity   Assist    Assist level: Minimal Assistance - Patient > 75% Assistive device: Walker-rolling    Walk 150 feet activity   Assist Walk 150 feet activity did not occur: Safety/medical concerns  Assist level: Minimal Assistance - Patient >  75% Assistive device: Walker-rolling    Walk 10 feet on uneven surface  activity   Assist Walk 10 feet on uneven surfaces activity did not occur: Safety/medical concerns         Wheelchair     Assist Is the patient using a wheelchair?: Yes (for transport b/c of impaired activity tolerance limiting gait distance) Type of Wheelchair: Manual    Wheelchair assist level: Dependent - Patient 0%      Wheelchair 50 feet with 2 turns activity    Assist        Assist Level: Dependent - Patient 0%   Wheelchair 150 feet activity     Assist      Assist Level: Dependent - Patient 0%   Blood pressure 133/71, pulse 84, temperature 98.6 F (37 C), resp. rate 16, height 6' (1.829 m), weight 101.9 kg, SpO2 95%.  Medical Problem List and Plan: 1. 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 Formulized individual treatment plan based on medical history, underlying problem and comorbidities.   -15/7 schedule Amb 150'+ with RW, declines trying no AD with PT  2.  Antithrombotics: -DVT/anticoagulation:  Mechanical: Antiembolism stockings, thigh (TED hose) Bilateral lower extremities -antiplatelet therapy: Aspirin 81 mg daily and Plavix 75 mg daily x 3 weeks then Plavix alone 3. Pain Management: Tylenol as needed 4. Mood/Behavior/Sleep: Not emotional support             -antipsychotic agents: N/A ? Post CVA depression, trial buproprion given hx of HA with sertraline   5. Neuropsych/cognition: This patient is capable of making decisions on his own behalf. 6. Skin/Wound Care: Routine skin checks 7. Fluids/Electrolytes/Nutrition: Routine in and  outs with follow-up chemistries 8.  Hypertension.  Norvasc 5 mg daily.  Monitor with increased mobility  -1/11 intermittently elevated continue to monitor -04/24/23 BPs fine, cont regimen; of note, pharmacy stated he was on furosemide 80mg  daily but down to 40mg  daily on inpatient, but not resumed here  Increase amlodipine to 10mg  on 1/14 monitor  - overall improved ,mildly elevated systolic this am  -05/02/23 BP stable continue current regimen  Vitals:   04/30/23 1249 04/30/23 1652 04/30/23 2000 05/01/23 0044  BP: 119/77 124/63 138/60 127/65   05/01/23 0457 05/01/23  1446 05/01/23 1945 05/02/23 0421  BP: (!) 143/72 128/65 124/61 (!) 141/72   05/02/23 1402 05/02/23 1946 05/02/23 1949 05/03/23 0514  BP: (!) 123/55 129/67 129/67 133/71     9.  Diabetes mellitus peripheral neuropathy.  Latest hemoglobin A1c 12.3.  Currently on NovoLog 70/30 22 units twice daily.  Diabetic teaching.  Patient's home regimen of Glucophage 1000 mg twice daily as well as Toujeo 15 units in the morning and 70 units in the evening-- currently held.  Resume as needed 1/11 CBGs mildly elevated, restart metformin XR at 500 mg dose, continue current dose of 70/30 for now -04/24/23 CBGs in the 200s, recent med adjustment, monitor for trend with that change, but suspect he'll likely need further adjustments -Increase novalog 70/30 to 22U BID first dose 1/13 pm , improved CBG this am , may be able to d/c metformin  -1/17 CBGs controlled continue current -04/30/23 CBGs fine, not eating much, SSI d/c'd for now; getting IVFs today; monitor for now, but suspect CBGs will be less d/t poor PO intake -05/02/23 CBGs controlled cont metformin and 70/30 insulin  CBG (last 3)  Recent Labs    05/02/23 1714 05/02/23 2024 05/03/23 0612  GLUCAP 131* 173* 114*   10.  Chronic vertigo.  Continue Antivert 12.5 mg 3 times daily prn.  Patient followed outpatient by ENT   11.  BPH.  Proscar 5 mg daily, Flomax 0.4 mg daily.  Check PVR.   Followed by urology Dr. Tobie Lords [facility]  -1/11 continent continue current regimen  -04/24/23 no PVRs documented but continent, cont regimen   12.  History of breast cancer.  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  Status post modified radical mastectomy and chemotherapy.  Patient on tamoxifen 20mg  daily since April 2020 follow-up with outpatient oncology Dr Allison Quarry Vallathucherry  Have resumed tamoxifen  13.  GERD.  Protonix 40mg  daily   14.  AKI on CKD.  Creatinine baseline 1.29-1.64.  Follow-up chemistries  -    Latest Ref Rng & Units 05/02/2023    4:56 AM 04/30/2023    5:52 AM 04/28/2023    5:08 PM  BMP  Glucose 70 - 99 mg/dL 725  366  440   BUN 8 - 23 mg/dL 13  11  13    Creatinine 0.61 - 1.24 mg/dL 3.47  4.25  9.56   Sodium 135 - 145 mmol/L 132  132  133   Potassium 3.5 - 5.1 mmol/L 4.7  4.3  4.2   Chloride 98 - 111 mmol/L 101  99  100   CO2 22 - 32 mmol/L 19  20  20    Calcium 8.9 - 10.3 mg/dL 9.0  9.1  9.1      15.  Obesity.  BMI 30.45.  Dietary follow-up  16. Constipation: on miralax BID- large cont liquid BM this am  -04/24/23 reports LBM was yesterday or two days ago, no documentation of BMs on file-- also refusing miralax; if no BM by tomorrow, would consider encouraging pt to use miralax, or using alternative bowel regimen that he'd be willing to use.  -05/01/23 LBM 2 days ago but poor PO intake last couple days, monitor for now  17. Cough/nausea/Metapneumovirus- on droplet precautions  -CXR ordered-no acute changes  -WBC within normal limits on CBC 1/16 and 1/18   -1/17 respiratory panel is pending- had low grade temp 1/17 and 1/18 now resolved   Encourage use of flutter valve - discussed lack of specific antiviral treatment  18.  Mildly elevated LFTs  -Monitor Tylenol usage, recheck Monday    LOS: 11 days A FACE TO FACE EVALUATION WAS PERFORMED  Erick Colace 05/03/2023, 7:47 AM

## 2023-05-03 NOTE — Progress Notes (Signed)
Physical Therapy Weekly Progress Note  Patient Details  Name: John [name] MRN: [id number] Date of Birth: 1945-08-20  Beginning of progress report period: April 24, 2023 End of progress report period: May 03, 2023  Today's Date: 05/03/2023 PT Individual Time: 1310-1407 PT Individual Time Calculation (min): 57 min   Patient has met 4 of 4 short term goals. John [name] is making slow, steady progress towards LTGs having most recent set-back due to medical diagnosis. He continues to have greatest deficit of impaired endurance, impaired balance, and generalized weakness impacting his functional mobility. He is performing supine<>sit with supervision, sit<>stand and stand pivot transfers using RW with CGA/light min A, ambulating up to ~167ft using RW with CGA/min A, and navigating stairs using B HRs with CGA/light min A. He will benefit from continued CIR level skilled physical therapy to further progress his independence with functional mobility prior to D/Cing home with support from his wife.  Patient continues to demonstrate the following deficits muscle weakness, decreased cardiorespiratoy endurance, and decreased standing balance, decreased postural control, and decreased balance strategies and therefore will continue to benefit from skilled PT intervention to increase functional independence with mobility.  Patient progressing toward long term goals; however, due to recent setback from medical diagnosis may require LTGs be downgraded depending on any changes in LOS and D/C date. Continue plan of care.  PT Short Term Goals Week 1:  PT Short Term Goal 1 (Week 1): Pt will perform sit<>stands using LRAD with CGA PT Short Term Goal 1 - Progress (Week 1): Met PT Short Term Goal 2 (Week 1): Pt will perform bed<>chair transfers using LRAD with CGA PT Short Term Goal 2 - Progress (Week 1): Met PT Short Term Goal 3 (Week 1): Pt will ambulate at least 117ft using LRAD with CGA PT Short Term Goal  3 - Progress (Week 1): Met PT Short Term Goal 4 (Week 1): Pt will navigate at least 8 steps using HRs with CGA PT Short Term Goal 4 - Progress (Week 1): Met Week 2:  PT Short Term Goal 1 (Week 2): = to LTGs based on ELOS  Skilled Therapeutic Interventions/Progress Updates:  Ambulation/gait training;Community reintegration;DME/adaptive equipment instruction;Neuromuscular re-education;Psychosocial support;Stair training;UE/LE Strength taining/ROM;Balance/vestibular training;Discharge planning;Functional electrical stimulation;Pain management;Skin care/wound management;Therapeutic Activities;UE/LE Coordination activities;Cognitive remediation/compensation;Disease management/prevention;Functional mobility training;Patient/family education;Splinting/orthotics;Therapeutic Exercise;Visual/perceptual remediation/compensation   Pt received supine in bed awake and agreeable to therapy session with his wife, Larene Beach, present and pt agreeable to therapy session.   Therapist provided education on the following to patient and his wife throughout therapy session:  - recommendation for follow-up HHPT  - recommendation for pt to utilize RW for all mobility  - discussed pt's medical diagnoses and impact on his endurance and mobility level; however, discussed pt's CLOF of overall ambulatory level at CGA/min A using RW - discussed plan for team conference tomorrow to re-discuss pt's progress and determine if anticipated D/C date is still appropriate, pt's wife expresses concerns that she is pt's only support and she cannot provide significant physical assistance - pt's wife concerned that pt will be sedentary at home and not continue to progress - discussed referral to the stroke peer support program to provide emotional support while here and this can also transition to support at D/C if the pt and peer desire - discussed goal of pt being a household ambulator and not currently recommending a wheelchair, but educated on  option of purchasing a transport chair if they plan on participating in more community based activities -  pt's wife expresses concerns regarding pt's ability to stand from his manual recliner, Becky, SW present to provide prescriptions for a lift chair and stair chair lift  - wife confirms pt has full bath on main level and his office on main level can be converted into a bedroom - therapist recommended this at this time as they have reported their stairs to the 2nd floor are very steep  - confirm pt has STE the house with B HRs - discussed how when pt is fatigued he puts less effort into his mobility, often resulting in increased balance instability and resulting in decreased eccentric control when going to sit, pt concurs that this occurs   Pt's wife present throughout session to observe pt's CLOF and ask questions as needed.  Supine>sitting R EOB, HOB elevated and using bedrail, with supervision.   Sit<>stands using RW with CGA and occasional min A for balance safety and controlling descent when fatigued. Gait training ~141ft to main therapy gym using RW with CGA and occasional instances of min A for balance stability - achieves reciprocal stepping pattern although decreased gait speed and min forward trunk flexed posture.    Stair navigation training ascending/descending 4 steps (6" height) x2 (seated break) using B HRs with CGA/light min assist for steadying/safety - pt self-selecting reciprocal pattern in both directions with good control. Educated pt's wife on how she can safely assist pt with RW management up/down stairs while ensuring their safety.   Educated on pt's need for a place to allow seated rest break soon after performing stair navigation.  Simulated ambulatory car transfer (small SUV height - [employer] Focus) using RW with CGA/light min A for steadying - educated on safe technique of turning to sit prior to placing LEs into vehicle.  Pt's wife planning to attend additional session of  education/training prior to D/C with plan to schedule this after updates from team conference tomorrow. Transported pt back to room. Stand pivot to EOB using RW with min A. Sit>supine supervision. Pt left supine in bed with needs in reach, bed alarm on, and his wife present.    Therapy Documentation Precautions:  Precautions Precautions: Fall, Other (comment) Precaution Comments: Chronic vertigo; HOH Restrictions Weight Bearing Restrictions Per Provider Order: No   Pain:  No reports of pain throughout session.   Therapy/Group: Individual Therapy  Ginny Forth , PT, DPT, NCS, CSRS 05/03/2023, 12:45 PM

## 2023-05-03 NOTE — Progress Notes (Addendum)
Patient ID: John [name], male   DOB: 11-Sep-1945, 78 y.o.   MRN: [id number]  Wife prefers home health therapies and has no preference. Have made referral to [facility] and they have accepted the referral. Will see if any equipment recommended that does not already have.   1;42 PM Met with pt and wife who is here visiting to make sure got prescriptions for lift chair and stair lift she requested. Made aware of possible tax being taken off and lift chair unsure of coverage for. She will go to [employer] since has her CPAP from them and check on lift chairs there. Discussed with PT present pt does not feel ready to go home yet and has expressed this to his MD. Wife to stay and observe pt in PT session today. Will discuss in team conference tomorrow and get back with pt and wife after.

## 2023-05-03 NOTE — Progress Notes (Signed)
Occupational Therapy Session Note  Patient Details  Name: John [name] MRN: [id number] Date of Birth: 1945-12-19  Today's Date: 05/03/2023 OT Individual Time: 1100-1159 OT Individual Time Calculation (min): 59 min    Short Term Goals: Week 2:  OT Short Term Goal 1 (Week 2): STGs=LTGs due patient's estimated length of stay.  Skilled Therapeutic Interventions/Progress Updates:     Pt received deeply sleeping in bed, waking upon OT arrival. Pt presenting to be down in spirits and fatigued. Spent time at beginning of session engaging Pt in light hearted conversation to support moral and increase motivation to participate in session. Pt receptive to skilled OT session with mod encouragement reporting 0/10 pain- OT offering intermittent rest breaks, repositioning, and therapeutic support to optimize participation in therapy session. Focused this session on activity tolerance to increase overall safety and participation in BADLs. Pt requiring significantly increased amount of time to initiate tasks and participate in session. Pt transitioned to EOB using bed features with supervision +increased time. Pt's clothes noted to be dirty from spilling last meal, however Pt requesting to not change clothes during session. Donned thigh high TEDs total A for time management and energy conservation. Engaged Pt in completing functional mobility to therapy gym using RW for endurance and functional mobility training with Pt able to complete with CGA to light min A for balance and seated rest break provided following. In therapy gym, engaged Pt in B U/LE reciprocal movement and endurance training on NuStep to increased activity tolerance for BADLs. Pt able to tolerate completing 2 intervals of 3 minutes on a workload of 5 with rest break provided between trials. Pt ambulated back to room using RW with CGA provided for balance and requested to use restroom at end of session. Pt completed ambulatory transfer to toilet  using RW and completed 3/3 toileting tasks with CGA this session following continent void. Pt extremely fatigued at end of session and requesting to return to bed. Educated Pt on orthostatic hypotension management and encouraged Pt to stay in recliner to support improved postural alignment, however Pt declining. Doff thigh high TEDs total A. EOB>supine supervision using bed rail. Pt was left resting in bed with call bell in reach, bed alarm on, and all needs met.    Vitals monitored throughout session as stated below. Pt wearing thigh high TED and not symptomatic of OH this session: Vitals sitting beginning of session: BP 119/50 HR 88 PSO2 97% Vitals following ~150 ft ambulation: BP121/65(79) HR 88 PSO2 99% Vitals following NuStep 3 min interval:  BP 116/63(77) HR 90 PSO2 97% Vitals following 2nd NuStep 3 min interval: BP 117/63(77) HR 91 PSO2 98%  Therapy Documentation Precautions:  Precautions Precautions: Fall, Other (comment) Precaution Comments: Chronic vertigo; HOH Restrictions Weight Bearing Restrictions Per Provider Order: No   Therapy/Group: Individual Therapy  Clide Deutscher 05/03/2023, 3:36 PM

## 2023-05-03 NOTE — Progress Notes (Signed)
Physical Therapy Session Note  Patient Details  Name: John [name] MRN: [id number] Date of Birth: Feb 11, 1946  Today's Date: 05/03/2023 PT Individual Time: 7846-9629 PT Individual Time Calculation (min): 26 min   Short Term Goals: Week 1:  PT Short Term Goal 1 (Week 1): Pt will perform sit<>stands using LRAD with CGA PT Short Term Goal 2 (Week 1): Pt will perform bed<>chair transfers using LRAD with CGA PT Short Term Goal 3 (Week 1): Pt will ambulate at least 189ft using LRAD with CGA PT Short Term Goal 4 (Week 1): Pt will navigate at least 8 steps using HRs with CGA  Skilled Therapeutic Interventions/Progress Updates:   Received pt semi-reclined in bed, pt agreeable to PT treatment, and denied any pain during session. Session with emphasis on functional mobility/transfers, generalized strengthening and endurance, dynamic standing balance/coordination, and gait training. Pt performed bed mobility with HOB elevated and use of bedrails with supervision x 2 trials throughout session. Pt performed all transfers with RW and close supervision throughout session. Pt ambulated 138ft x 2 trials with RW and CGA to hold pants up (otherwise close supervision) to/from dayroom. Pt performed standing toe taps to 6in step x10 bilaterally with BUE support and CGA. Returned to room and concluded session with pt semi-reclined in bed, needs within reach, and bed alarm on.   Therapy Documentation Precautions:  Precautions Precautions: Fall, Other (comment) Precaution Comments: Chronic vertigo; HOH Restrictions Weight Bearing Restrictions Per Provider Order: No  Therapy/Group: Individual Therapy Marlana Salvage Zaunegger Blima Rich PT, DPT 05/03/2023, 8:03 AM

## 2023-05-04 ENCOUNTER — Other Ambulatory Visit (HOSPITAL_COMMUNITY): Payer: Self-pay

## 2023-05-04 LAB — GLUCOSE, CAPILLARY
Glucose-Capillary: 108 mg/dL — ABNORMAL HIGH (ref 70–99)
Glucose-Capillary: 126 mg/dL — ABNORMAL HIGH (ref 70–99)
Glucose-Capillary: 126 mg/dL — ABNORMAL HIGH (ref 70–99)
Glucose-Capillary: 120 mg/dL — ABNORMAL HIGH (ref 70–99)

## 2023-05-04 MED ORDER — OMEPRAZOLE 40 MG PO CPDR
DELAYED_RELEASE_CAPSULE | ORAL | 0 refills | Status: DC
  Filled 2023-05-04: qty 90, 90d supply, fill #0

## 2023-05-04 MED ORDER — BUPROPION HCL 75 MG PO TABS
75.0000 mg | ORAL_TABLET | Freq: Two times a day (BID) | ORAL | 0 refills | Status: DC
  Filled 2023-05-04: qty 60, 30d supply, fill #0

## 2023-05-04 MED ORDER — INSULIN LISPRO 200 UNIT/ML ~~LOC~~ SOPN
25.0000 [IU] | PEN_INJECTOR | Freq: Every day | SUBCUTANEOUS | 0 refills | Status: DC
  Filled 2023-05-04: qty 3, 12d supply, fill #0

## 2023-05-04 MED ORDER — POTASSIUM CHLORIDE CRYS ER 20 MEQ PO TBCR
40.0000 meq | EXTENDED_RELEASE_TABLET | Freq: Every day | ORAL | 0 refills | Status: DC
  Filled 2023-05-04: qty 30, 15d supply, fill #0

## 2023-05-04 MED ORDER — CLOPIDOGREL BISULFATE 75 MG PO TABS
75.0000 mg | ORAL_TABLET | Freq: Every day | ORAL | 0 refills | Status: DC
  Filled 2023-05-04: qty 30, 30d supply, fill #0

## 2023-05-04 MED ORDER — INSULIN GLARGINE (2 UNIT DIAL) 300 UNIT/ML ~~LOC~~ SOPN
PEN_INJECTOR | SUBCUTANEOUS | 0 refills | Status: DC
  Filled 2023-05-04: qty 9, 30d supply, fill #0

## 2023-05-04 MED ORDER — TAMSULOSIN HCL 0.4 MG PO CAPS
0.4000 mg | ORAL_CAPSULE | Freq: Every day | ORAL | 0 refills | Status: DC
  Filled 2023-05-04: qty 30, 30d supply, fill #0

## 2023-05-04 MED ORDER — AMLODIPINE BESYLATE 10 MG PO TABS
10.0000 mg | ORAL_TABLET | Freq: Every day | ORAL | 0 refills | Status: DC
  Filled 2023-05-04: qty 30, 30d supply, fill #0

## 2023-05-04 MED ORDER — MOMETASONE FURO-FORMOTEROL FUM 100-5 MCG/ACT IN AERO
2.0000 | INHALATION_SPRAY | Freq: Two times a day (BID) | RESPIRATORY_TRACT | Status: DC
  Administered 2023-05-04 – 2023-05-05 (×4): 2 via RESPIRATORY_TRACT
  Filled 2023-05-04: qty 8.8

## 2023-05-04 MED ORDER — PANTOPRAZOLE SODIUM 40 MG PO TBEC
40.0000 mg | DELAYED_RELEASE_TABLET | Freq: Every day | ORAL | 0 refills | Status: DC
  Filled 2023-05-04: qty 30, 30d supply, fill #0

## 2023-05-04 MED ORDER — FINASTERIDE 5 MG PO TABS
5.0000 mg | ORAL_TABLET | Freq: Every day | ORAL | 0 refills | Status: DC
  Filled 2023-05-04 – 2023-07-18 (×2): qty 30, 30d supply, fill #0

## 2023-05-04 MED ORDER — MECLIZINE HCL 12.5 MG PO TABS
12.5000 mg | ORAL_TABLET | Freq: Three times a day (TID) | ORAL | 0 refills | Status: DC | PRN
  Filled 2023-05-04: qty 30, 10d supply, fill #0

## 2023-05-04 MED ORDER — METFORMIN HCL ER 500 MG PO TB24
1000.0000 mg | ORAL_TABLET | Freq: Two times a day (BID) | ORAL | 0 refills | Status: DC
  Filled 2023-05-04: qty 120, 30d supply, fill #0

## 2023-05-04 MED ORDER — FUROSEMIDE 80 MG PO TABS
80.0000 mg | ORAL_TABLET | Freq: Every day | ORAL | 0 refills | Status: DC
  Filled 2023-05-04: qty 30, 30d supply, fill #0

## 2023-05-04 MED ORDER — GUAIFENESIN 100 MG/5ML PO LIQD: 10.0000 mL | Freq: Four times a day (QID) | ORAL | Status: DC | PRN

## 2023-05-04 MED ORDER — ATORVASTATIN CALCIUM 80 MG PO TABS
80.0000 mg | ORAL_TABLET | Freq: Every day | ORAL | 0 refills | Status: DC
  Filled 2023-05-04: qty 30, 30d supply, fill #0

## 2023-05-04 NOTE — Progress Notes (Signed)
Occupational Therapy Session Note  Patient Details  Name: John [name] MRN: [id number] Date of Birth: 07/20/1945  Today's Date: 05/04/2023 OT Individual Time: [phone] OT Individual Time Calculation (min): 70 min    Short Term Goals: Week 2:  OT Short Term Goal 1 (Week 2): STGs=LTGs due patient's estimated length of stay.  Skilled Therapeutic Interventions/Progress Updates:     Pt received sitting up in bed presenting to be in good spirits and more positive this AM compared to previous sessions stating "I feel like I am making more progress now and am on the way up!". Pt receptive to skilled OT session reporting 0/10 pain- OT offering intermittent rest breaks, repositioning, and therapeutic support to optimize participation in therapy session. Focused session on shower level BADLs with emphasis on safety awareness and energy conservation. Pt transitioned to EOB using bed features with supervision. Engaged Pt in completing functional mobility to bathroom using RW and transferred to walk-in shower with CGA. Educated Pt on safety considerations when donn/doffing clothing, sitting to doff clothing for increased safety to avoid falls with Pt receptive to education. Pt doffed pants from waist CGA and doffed shirt supervision seated on TTB. Once seated in shower, Pt able to complete U/LB bathing with close supervision with min verbal cues required for sequencing and recalling need to wash hair. Pt utilized grab bars to stand when washing buttocks only, no LOB present. Following shower, Pt ambulated to EOB using RW with CGA. Increased time for rest break provided sitting EOB to conserve energy. Pt donned clean shirt with supervision and weaved B LEs into pants with close supervision and stood using RW to bring pants to waist CGA. Thigh high TEDS donned total A for time management and energy conservation. Engaged Pt in completing functional mobility ~150 ft x2 trials for endurance and functional mobility  training- Pt completed with CGA and min verbal cues for R foot height with seated rest breaks provided between trials. Pt receptive to sitting up in recliner at end of session to support improved trunk alignment. Pt was left resting in recliner with call bell in reach, setbelt alarm on, and all needs met.    Therapy Documentation Precautions:  Precautions Precautions: Fall, Other (comment) Precaution Comments: Chronic vertigo; HOH Restrictions Weight Bearing Restrictions Per Provider Order: No   Therapy/Group: Individual Therapy  Clide Deutscher 05/04/2023, 8:29 AM

## 2023-05-04 NOTE — Progress Notes (Signed)
PROGRESS NOTE   Subjective/Complaints:  Pt feels better about going home, wife was in therapy yesterday   ROS: . Denies CP, SOB, abd pain, Objective:   No results found.  Recent Labs    05/02/23 0456  WBC 7.0  HGB 12.3*  HCT 37.9*  PLT 296    Recent Labs    05/02/23 0456  NA 132*  K 4.7  CL 101  CO2 19*  GLUCOSE 115*  BUN 13  CREATININE 1.00  CALCIUM 9.0     Intake/Output Summary (Last 24 hours) at 05/04/2023 0757 Last data filed at 05/03/2023 1000 Gross per 24 hour  Intake 476 ml  Output --  Net 476 ml         Physical Exam: Vital Signs Blood pressure 135/64, pulse 76, temperature 98.2 F (36.8 C), resp. rate 18, height 6' (1.829 m), weight 101.9 kg, SpO2 94%. Physical Exam General: No acute distress, getting up in bed to work with PT.  Vital signs reviewed Mood and affect are appropriate Heart: Regular rate and rhythm no rubs murmurs or extra sounds Chest - s/p right masectomy  Lungs: Clear to auscultation, breathing unlabored, no rales or wheezes, on RA Abdomen: Positive bowel sounds, soft nontender to palpation, nondistended Extremities: No clubbing, cyanosis, or edema Skin: No evidence of breakdown, no evidence of rash over exposed surfaces   Neurologic: Cranial nerves II through XII grossly intact, motor strength is 5/5 in bilateral deltoid, bicep, tricep, grip, hip flexor, knee extensors, ankle dorsiflexor and plantar flexor    Musculoskeletal: Full range of motion in all 4 extremities. No joint swelling     Assessment/Plan: 1. Functional deficits which require 3+ hours per day of interdisciplinary therapy in a comprehensive inpatient rehab setting. Physiatrist is providing close team supervision and 24 hour management of active medical problems listed below. Physiatrist and rehab team continue to assess barriers to discharge/monitor patient progress toward functional and medical  goals  Care Tool:  Bathing    Body parts bathed by patient: Right arm, Left arm, Chest, Abdomen, Front perineal area, Right upper leg, Left upper leg, Face, Buttocks, Left lower leg, Right lower leg   Body parts bathed by helper: Buttocks, Left lower leg, Right lower leg     Bathing assist Assist Level: Minimal Assistance - Patient > 75%     Upper Body Dressing/Undressing Upper body dressing   What is the patient wearing?: Pull over shirt    Upper body assist Assist Level: Supervision/Verbal cueing    Lower Body Dressing/Undressing Lower body dressing      What is the patient wearing?: Pants     Lower body assist Assist for lower body dressing: Minimal Assistance - Patient > 75%     Toileting Toileting Toileting Activity did not occur (Clothing management and hygiene only): N/A (no void or bm)  Toileting assist Assist for toileting: Minimal Assistance - Patient > 75%     Transfers Chair/bed transfer  Transfers assist     Chair/bed transfer assist level: Minimal Assistance - Patient > 75%     Locomotion Ambulation   Ambulation assist      Assist level: Minimal Assistance - Patient > 75%  Assistive device: Walker-rolling Max distance: 58   Walk 10 feet activity   Assist     Assist level: Minimal Assistance - Patient > 75% Assistive device: Walker-rolling   Walk 50 feet activity   Assist    Assist level: Minimal Assistance - Patient > 75% Assistive device: Walker-rolling    Walk 150 feet activity   Assist Walk 150 feet activity did not occur: Safety/medical concerns  Assist level: Minimal Assistance - Patient > 75% Assistive device: Walker-rolling    Walk 10 feet on uneven surface  activity   Assist Walk 10 feet on uneven surfaces activity did not occur: Safety/medical concerns         Wheelchair     Assist Is the patient using a wheelchair?: Yes (for transport b/c of impaired activity tolerance limiting gait  distance) Type of Wheelchair: Manual    Wheelchair assist level: Dependent - Patient 0%      Wheelchair 50 feet with 2 turns activity    Assist        Assist Level: Dependent - Patient 0%   Wheelchair 150 feet activity     Assist      Assist Level: Dependent - Patient 0%   Blood pressure 135/64, pulse 76, temperature 98.2 F (36.8 C), resp. rate 18, height 6' (1.829 m), weight 101.9 kg, SpO2 94%.  Medical Problem List and Plan: 1. 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 Formulized individual treatment plan based on medical history, underlying problem and comorbidities.   -15/7 schedule Amb 150'+ with RW, declines trying no AD with PT  2.  Antithrombotics: -DVT/anticoagulation:  Mechanical: Antiembolism stockings, thigh (TED hose) Bilateral lower extremities -antiplatelet therapy: Aspirin 81 mg daily and Plavix 75 mg daily x 3 weeks then Plavix alone 3. Pain Management: Tylenol as needed 4. Mood/Behavior/Sleep: Not emotional support             -antipsychotic agents: N/A ? Post CVA depression, trial buproprion given hx of HA with sertraline   5. Neuropsych/cognition: This patient is capable of making decisions on his own behalf. 6. Skin/Wound Care: Routine skin checks 7. Fluids/Electrolytes/Nutrition: Routine in and outs with follow-up chemistries 8.  Hypertension.  Norvasc 5 mg daily.  Monitor with increased mobility  -1/11 intermittently elevated continue to monitor -04/24/23 BPs fine, cont regimen; of note, pharmacy stated he was on furosemide 80mg  daily but down to 40mg  daily on inpatient, but not resumed here  Increase  amlodipine to 10mg  on 1/14 monitor  - overall improved ,mildly elevated systolic this am  -05/04/23 BP stable continue current regimen  Vitals:   05/01/23 0457 05/01/23 1446 05/01/23 1945 05/02/23 0421  BP: (!) 143/72 128/65 124/61 (!) 141/72   05/02/23 1402 05/02/23 1946 05/02/23 1949 05/03/23 0514  BP: (!) 123/55 129/67 129/67 133/71   05/03/23 0801 05/03/23 1417 05/03/23 2206 05/04/23 0626  BP: 131/75 137/67 126/60 135/64     9.  Diabetes mellitus peripheral neuropathy.  Latest hemoglobin A1c 12.3.  Currently on NovoLog 70/30 22 units twice daily.  Diabetic teaching.  Patient's home regimen of Glucophage 1000 mg twice daily as well as Toujeo 15 units in the morning and 70 units in the evening-- currently held.  Resume as needed 1/11 CBGs mildly elevated, restart metformin XR at 500 mg dose, continue current dose of 70/30 for now -04/24/23 CBGs in the 200s, recent med adjustment, monitor for trend with that change, but suspect he'll likely need further adjustments -Increase novalog 70/30 to 22U BID first dose 1/13 pm , improved CBG this am , may be able to d/c metformin  -1/17 CBGs controlled continue current -04/30/23 CBGs fine, not eating much, SSI d/c'd for now; getting IVFs today; monitor for now, but suspect CBGs will be less d/t poor PO intake -05/02/23 CBGs controlled cont metformin and 70/30 insulin  CBG (last 3)  Recent Labs    05/03/23 1651 05/03/23 2203 05/04/23 0623  GLUCAP 169* 77 108*   10.  Chronic vertigo.  Continue Antivert 12.5 mg 3 times daily prn.  Patient followed outpatient by ENT   11.  BPH.  Proscar 5 mg daily, Flomax 0.4 mg daily.  Check PVR.  Followed by urology Dr. Tobie Lords [facility]  -1/11 continent continue current regimen  -04/24/23 no PVRs documented but continent, cont regimen   12.  History of breast cancer.  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  Status post modified radical mastectomy and chemotherapy.  Patient on tamoxifen 20mg  daily since  April 2020 follow-up with outpatient oncology Dr Allison Quarry Vallathucherry  Have resumed tamoxifen  13.  GERD.  Protonix 40mg  daily   14.  AKI on CKD.  Creatinine baseline 1.29-1.64.  Follow-up chemistries  -    Latest Ref Rng & Units 05/02/2023    4:56 AM 04/30/2023    5:52 AM 04/28/2023    5:08 PM  BMP  Glucose 70 - 99 mg/dL 629  528  413   BUN 8 - 23 mg/dL 13  11  13    Creatinine 0.61 - 1.24 mg/dL 2.44  0.10  2.72   Sodium 135 - 145 mmol/L 132  132  133   Potassium 3.5 - 5.1 mmol/L 4.7  4.3  4.2   Chloride 98 - 111 mmol/L 101  99  100   CO2 22 - 32 mmol/L 19  20  20    Calcium 8.9 - 10.3 mg/dL 9.0  9.1  9.1      15.  Obesity.  BMI 30.45.  Dietary follow-up  16. Constipation: on miralax BID- large cont liquid BM this am  -04/24/23 reports LBM was yesterday or two days ago, no documentation of BMs on file-- also refusing miralax; if no BM by tomorrow, would consider encouraging pt to use miralax, or using alternative bowel regimen that he'd be willing to use.  -05/01/23 LBM 2 days ago but poor PO intake last couple days, monitor for now  17. Cough/nausea/Metapneumovirus- on droplet precautions  -CXR ordered-no acute changes  -WBC within normal limits on CBC 1/16 and 1/18   -1/17 respiratory panel is pending- had low grade temp 1/17 and 1/18 now resolved   Encourage use of flutter valve - discussed lack of specific antiviral treatment     18.  Mildly elevated LFTs  -Monitor Tylenol usage, recheck Monday    LOS: 12 days A FACE TO FACE EVALUATION WAS PERFORMED  Erick Colace 05/04/2023, 7:57 AM

## 2023-05-04 NOTE — Progress Notes (Signed)
Physical Therapy Session Note  Patient Details  Name: John [name] MRN: [id number] Date of Birth: 22-Oct-1945  Today's Date: 05/04/2023 PT Individual Time: 1107-1202 PT Individual Time Calculation (min): 55 min   Short Term Goals: Week 2:  PT Short Term Goal 1 (Week 2): = to LTGs based on ELOS  Skilled Therapeutic Interventions/Progress Updates:    Pt received awake supported upright in bed and agreeable to therapy session. Pt overall with increased mood and moral today. Supine>sitting R EOB, HOB elevated and using bedrail, with supervision and increased time/effort. Sitting EOB, donned shoes min A without use of shoe horn. Sit>stand EOB>RW with skilled min A and cuing for increased anterior trunk lean due to lack of anterior weight shift making it harder to lift his hips from the bed. In standing, requires heavy min A for balance due to posterior lean/LOB with poor initiation of anterior weight shift to regain balance.  Pt reports his wife is working to retrieve a lift chair to help him come to standing at home.  Gait training ~268ft to main therapy gym using RW with CGA for steadying. Pt demonstrating the following gait deviations with therapist providing the described cuing and facilitation for improvement:  - forward trunk flexed posture  - reciprocal stepping pattern - decreased, but adequate gait speed for home environment  - no significant moments of balance instability requiring increased assist as noted yesterday  Stair navigation training ascending/descending 8 steps x2 trials (6" height) using B HRs with min assist primarily on descent during first trial due to posterior lean, cuing for improvement - reciprocal pattern in both directions - progressed to CGA on 2nd trial and when inquired on the difference pt reports he "applied" himself on the 2nd trial.  Gait training 39ft x2 using R HHA with light min assist for balance. Pt continues to achieve reciprocal stepping pattern, has  improved upright trunk posture, and slight improvement in gait speed; however, increased balance instability focusing primarily on forward, straight gait at this time.  Repeated sit<>stands to/from EOM 2x 5reps with light CGA/min A to come to stand and then heavy min/mod A to prevent posterior LOB once in standing - therapist attempting to reinforce ankle and hip balance recovery strategies to maintain upright; however, pt with limited effort in implementing them and often just responds to LOB by sitting back on EOM.  Standing R HHA alternating foot taps to 6" step using R HHA with heavy min A for balance due to R posterior lean x10 reps.   Transported back to room. Short distance ~74ft ambulatory transfer w/c>EOB using RW with CGA (pt tends to come to standing better from seat with armrests).   Sit>supine with supervision. Pt left supine in bed with needs in reach and bed alarm on.    Therapy Documentation Precautions:  Precautions Precautions: Fall, Other (comment) Precaution Comments: Chronic vertigo; HOH Restrictions Weight Bearing Restrictions Per Provider Order: No   Pain:  No reports of pain throughout session.    Therapy/Group: Individual Therapy  Ginny Forth , PT, DPT, NCS, CSRS 05/04/2023, 8:01 AM

## 2023-05-05 ENCOUNTER — Other Ambulatory Visit (HOSPITAL_COMMUNITY): Payer: Self-pay

## 2023-05-05 DIAGNOSIS — I10 Essential (primary) hypertension: Secondary | ICD-10-CM | POA: Diagnosis not present

## 2023-05-05 DIAGNOSIS — L89301 Pressure ulcer of unspecified buttock, stage 1: Secondary | ICD-10-CM | POA: Diagnosis not present

## 2023-05-05 DIAGNOSIS — I63511 Cerebral infarction due to unspecified occlusion or stenosis of right middle cerebral artery: Secondary | ICD-10-CM | POA: Diagnosis not present

## 2023-05-05 LAB — GLUCOSE, CAPILLARY
Glucose-Capillary: 130 mg/dL — ABNORMAL HIGH (ref 70–99)
Glucose-Capillary: 151 mg/dL — ABNORMAL HIGH (ref 70–99)
Glucose-Capillary: 71 mg/dL (ref 70–99)
Glucose-Capillary: 142 mg/dL — ABNORMAL HIGH (ref 70–99)

## 2023-05-05 MED ORDER — AMLODIPINE BESYLATE 5 MG PO TABS
5.0000 mg | ORAL_TABLET | Freq: Every day | ORAL | Status: DC
  Administered 2023-05-06 – 2023-05-07 (×2): 5 mg via ORAL
  Filled 2023-05-05 (×2): qty 1

## 2023-05-05 NOTE — Patient Care Conference (Signed)
Inpatient RehabilitationTeam Conference and Plan of Care Update Date: 05/04/2023   Time: 10:54 AM    Patient Name: John [name]      Medical Record Number: [id number]  Date of Birth: 05/08/1945 Sex: Male         Room/Bed: 4W23C/4W23C-01 Payor Info: Payor: [employer]ICARE / Plan: [employer] MEDICARE / Product Type: *No Product type* /    Admit Date/Time:  04/22/2023  3:26 PM  Primary Diagnosis:  Brainstem infarct, acute [facility])  Hospital Problems: Principal Problem:   Brainstem infarct, acute (HCC) Active Problems:   Right middle cerebral artery stroke (HCC)   Pressure injury of skin   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)   Coping style affecting medical condition    Expected Discharge Date: Expected Discharge Date: 05/07/23  Team Members Present: Physician leading conference: Dr. Claudette Laws Social Worker Present: Dossie Der, LCSW Nurse Present: Chana Bode, RN;Mariam Helbert Trilby Drummer, RN PT Present: Casimiro Needle, PT OT Present: Mariann Barter, OT SLP Present: Pablo Lawrence, SLP PPS Coordinator present : Fae Pippin, SLP     Current Status/Progress Goal Weekly Team Focus  Bowel/Bladder   Continent of B/b.Marland Kitchen LBM 05/01/23. Refuses stool softners at times.   Maintain continent   Assist with BR needs    Swallow/Nutrition/ Hydration               ADL's   UB ADLs mod I when seated, LB BADLs CGA uising RW, toileting CGA, shower/toilet transfer CGA (can required up to min A however if Pt is not motivated) using RW; Barriers- decreased motivation, self limiting, activity tolerance   mod I   Activity tolerance, Pt/family education, functional transfer training, general strengthening, BADL retraining    Mobility   supine<>sit with supervision, sit<>stand and stand pivot transfers using RW with CGA/light min A, ambulating up to ~123ft using RW with CGA/min A, and navigating stairs using B HRs with CGA/light min A --  continues to have significant endurance impairment with generalized weakness impacting indepence with mobility   supervision overall ambulatory level - may need to downgrade depending on adjustments to LOS  activity tolerance, pt education, transfer training, gait training, stair navigation training, dynamic standing balance - referral to peer suppoort has started - his wife has limited ability to provide physical support at D/C, initiated observation of therapy on Tue 1/21    Communication                Safety/Cognition/ Behavioral Observations               Pain   Denies pain   Remains pain free   Assess Q4 and prn    Skin   Intact skin   Maintain integrity  Assess QS and prn.      Discharge Planning:  Wife here yesterday and feels along with pt discharge date is too soon and he has not met his goals. Both want an extension. Wife can provide supervision but says can not provide care and it is just here. Observed in PT session yesterday   Team Discussion: Brainstem infarct. Blood pressures improved. Viral lower respiratory infection with increase in cough.  Hasn't been able to tolerate therapy on a regular schedule. Education provided on appropriate diet/lifestyle modifications. Lack of concern for poor choices. Family education went well.  Mood/behavior has improved since education provided. Self reports that will work harder. Therapies focusing on activity tolerance, functional transfer training, gait training, stair navigation training,  general strengthening, and BADLs retraining. Does have some LOB at times with difficulty correcting.   Patient on target to meet rehab goals: yes, continues to progress towards goals with discharge changed from 05/05/23 to 05/07/2023 due to progress made in last two days and allow for additional day of family education.   *See Care Plan and progress notes for long and short-term goals.   Revisions to Treatment Plan:  Medication adjustments  as needed. Additional family education day prior to discharge. Monitor labs and VS  Teaching Needs: Medications, safety, self care, transfers, toileting, skin care, diet/lifestyle modifications, etc.   Current Barriers to Discharge: Decreased caregiver support, Weight, and Behavior  Possible Resolutions to Barriers: Family education Adhere to an appropriate diet/lifestyle modifications to improve A1C of 12.4, healthy weight and overall health. Order recommended DME     Medical Summary Current Status: BPs improved, viral LRI, history of chronic debility  Barriers to Discharge: Medical stability   Possible Resolutions to [employer] Focus: caregiver training on going, cont supportive care for LRI, trial advair for cough   Continued Need for Acute Rehabilitation Level of Care: The patient requires daily medical management by a physician with specialized training in physical medicine and rehabilitation for the following reasons: Direction of a multidisciplinary physical rehabilitation program to maximize functional independence : Yes Medical management of patient stability for increased activity during participation in an intensive rehabilitation regime.: Yes Analysis of laboratory values and/or radiology reports with any subsequent need for medication adjustment and/or medical intervention. : Yes   I attest that I was present, lead the team conference, and concur with the assessment and plan of the team.   Jearld Adjutant 05/05/2023, 1:07 PM

## 2023-05-05 NOTE — Progress Notes (Signed)
Patient ID: John [name], male   DOB: March 21, 1946, 78 y.o.   MRN: [id number]  Met with pt and wife to schedule family education for tomorrow from 2-4 so can do hands on with husband in preparation for discharged Sat. Aware of team conference and need for extra time and new discharge Sat. Work toward discharge needs.

## 2023-05-05 NOTE — Discharge Summary (Signed)
Physical Therapy Discharge Summary  Patient Details  Name: John [name] MRN: [id number] Date of Birth: May 03, 1945  Date of Discharge from PT service:May 06, 2023  {CHL IP REHAB PT TIME CALCULATION:304800500}   Patient has met {NUMBERS 0-12:18577} of {NUMBERS 0-12:18577} long term goals due to {due WU:9811914}.  Patient to discharge at an ambulatory level  CGA*** using RW.   Patient's care partner is independent *** to provide the necessary physical and cognitive assistance at discharge.  Reasons goals not met: ***  Recommendation:  Patient will benefit from ongoing skilled PT services in home health setting to continue to advance safe functional mobility, address ongoing impairments in B LE strengthening, endurance training, transfer training (sit>stand powering up), dynamic standing balance, dynamic gait training using LRAD, stair navigation, and minimize fall risk.  Equipment: RW  Reasons for discharge: treatment goals met and discharge from hospital ***  Patient/family agrees with progress made and goals achieved: Yes    PT Discharge Precautions/Restrictions Precautions Precautions: Fall;Other (comment) Precaution Comments: Chronic vertigo; HOH; monitor for orthostatic hypotension (wears thigh high TED hose) Restrictions Weight Bearing Restrictions Per Provider Order: No Pain Pain Assessment Pain Scale: 0-10 Pain Score: 0-No pain Pain Interference Pain Interference Pain Effect on Sleep: 0. Does not apply - I have not had any pain or hurting in the past 5 days Pain Interference with Therapy Activities: 1. Rarely or not at all Pain Interference with Day-to-Day Activities: 1. Rarely or not at all Vision/Perception  Vision - History Ability to See in Adequate Light: 1 Impaired (uses glasses) Perception Perception: Within Functional Limits Praxis Praxis: WFL  Cognition Arousal/Alertness: Awake/alert Orientation Level: Oriented X4 Year: 2025 Month:  January Day of Week: Correct Attention: Focused;Sustained Focused Attention: Appears intact Sustained Attention: Appears intact Memory: Impaired (pt reports recently having moments where he feels like his mind "goes blank" and has some difficulty keeping track of events that have occurred) Awareness: Appears intact Safety/Judgment: Appears intact Sensation Sensation Light Touch: Impaired Detail Peripheral sensation comments: 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 Light Touch Impaired Details: Impaired RUE;Impaired LUE;Impaired RLE;Impaired LLE Hot/Cold: Not tested Proprioception: Appears Intact Stereognosis: Not tested Coordination Gross Motor Movements are Fluid and Coordinated: No Coordination and Movement Description: Deficits due to generalized deconditioning and impaired endurance along with impaired balance from neuropathy and delayed balance strategies Motor  Motor Motor: Other (comment) Motor - Discharge Observations: continues to have generalized weakness and impaired endurance along with impaired balance from neuropathy resulting in delayed balance strategies, but improved from initial eval  Mobility ***   Locomotion  ***    Trunk/Postural Assessment  Cervical Assessment Cervical Assessment: Exceptions to [facility] (slight forward head) Thoracic Assessment Thoracic Assessment: Exceptions to [facility] (rounded shoulders) Lumbar Assessment Lumbar Assessment: Within Functional Limits Postural Control Postural Control: Deficits on evaluation Postural Limitations: decreased with posterior lean bias, but improved with use of RW  Balance Balance Balance Assessed: Yes Standardized Balance Assessment Standardized Balance Assessment: Timed Up and Go Test Timed Up and Go Test TUG: Normal TUG Normal TUG (seconds): 25 (using RW) Static Sitting Balance Static Sitting - Balance Support: Feet supported Static Sitting - Level of Assistance: 6: Modified independent  (Device/Increase time) Dynamic Sitting Balance Dynamic Sitting - Balance Support: Feet supported Dynamic Sitting - Level of Assistance: 5: Stand by assistance Static Standing Balance Static Standing - Balance Support: During functional activity;Bilateral upper extremity supported Static Standing - Level of Assistance: 5: Stand by assistance;Other (comment) (CGA) Dynamic Standing Balance Dynamic Standing -  Balance Support: During functional activity;Bilateral upper extremity supported Dynamic Standing - Level of Assistance: Other (comment);5: Stand by assistance (CGA) Functional Gait  Assessment Gait assessed : Yes Gait Level Surface: Walks 20 ft in less than 7 sec but greater than 5.5 sec, uses assistive device, slower speed, mild gait deviations, or deviates 6-10 in outside of the 12 in walkway width. Change in Gait Speed: Able to smoothly change walking speed without loss of balance or gait deviation. Deviate no more than 6 in outside of the 12 in walkway width. Gait with Horizontal Head Turns: Performs head turns with moderate changes in gait velocity, slows down, deviates 10-15 in outside 12 in walkway width but recovers, can continue to walk. Gait with Vertical Head Turns: Performs task with slight change in gait velocity (eg, minor disruption to smooth gait path), deviates 6 - 10 in outside 12 in walkway width or uses assistive device Gait and Pivot Turn: Turns slowly, requires verbal cueing, or requires several small steps to catch balance following turn and stop Step Over Obstacle: Is able to step over one shoe box (4.5 in total height) without changing gait speed. No evidence of imbalance. Gait with Narrow Base of Support: Ambulates less than 4 steps heel to toe or cannot perform without assistance. Gait with Eyes Closed: Walks 20 ft, slow speed, abnormal gait pattern, evidence for imbalance, deviates 10-15 in outside 12 in walkway width. Requires more than 9 sec to ambulate 20  ft. Ambulating Backwards: Walks 20 ft, uses assistive device, slower speed, mild gait deviations, deviates 6-10 in outside 12 in walkway width. Steps: Alternating feet, must use rail. Total Score: 16 FGA comment:: high risk for falls. Extremity Assessment      RLE Assessment RLE Assessment: Exceptions to [facility] Active Range of Motion (AROM) Comments: WFL/WNL General Strength Comments: assessed in sitting RLE Strength Right Hip Flexion: 4+/5 Right Knee Flexion: 5/5 Right Knee Extension: 5/5 Right Ankle Dorsiflexion: 5/5 Right Ankle Plantar Flexion: 5/5 LLE Assessment LLE Assessment: Exceptions to [facility] Active Range of Motion (AROM) Comments: WFL/WNL General Strength Comments: assessed in sitting LLE Strength Left Hip Flexion: 4/5 Left Knee Flexion: 5/5 Left Knee Extension: 5/5 Left Ankle Dorsiflexion: 4/5 Left Ankle Plantar Flexion: 5/5   Carly M Pippin , PT, DPT, NCS, CSRS 05/05/2023, 8:46 AM

## 2023-05-05 NOTE — Progress Notes (Signed)
Occupational Therapy Session Note  Patient Details  Name: John [name] MRN: [id number] Date of Birth: [birth year]  Today's Date: 05/05/2023 OT Individual Time: 1118-1200 OT Individual Time Calculation (min): 42 min  OT Individual Time: 1345-1413 OT Individual Time Calculation (min): 28 min  OT Missed Time: 17 min (nausea/fatigue) Short Term Goals: Week 2:  OT Short Term Goal 1 (Week 2): STGs=LTGs due patient's estimated length of stay.  Skilled Therapeutic Interventions/Progress Updates:     AM Session:  Pt received reclined in bed, dressed and ready for the day upon OT arrival with thigh high TEDs donned. Pt presenting to be in good spirits receptive to skilled OT session reporting 0/10 pain- OT offering intermittent rest breaks, repositioning, and therapeutic support to optimize participation in therapy session. Focused this session on dynamic balance, BP assessment, and activity tolerance to increase safety and independence in BADLs. Pt transitioned to EOB using bed rails mod I and donned shoes supervisions.  Vitals sitting EOB: BP 130/63(70) HR 84 PSO2 97% Engaged Pt in completing functional mobility to therapy gym using RW with Pt able to complete ~144ft with close supervision-CGA. No LOB during task and min verbal cues required for head positioning. Seated rest break provided following.  Vitals following ambulation: BP 128/73(89) HR 77 PSO2 98%  Engaged Pt in dynamic standing balance functional reaching activity with posterior and overhead reaching incorporated into task to simulate skills required for BADLs using RW for balance. Pt instructed to retrieve medium resistance clothes pins from posterior portion of his shirt and donn them onto net of elevated basket ball goal alternating reaching with R/L UE. Pt able to maintain standing balance ~3 minutes x2 trials during activity with close supervision-CGA. Seated rest breaks provided between and following trials. Pt reporting no dizziness  symptoms during activity.  Vitals assess seated following ~3 minutes of standing: BP 107/77(86) HR 83 PSO2 98% Pt fatigued at end of session, however receptive to walking back to his room with min motivation required. Pt completed ~150 ft functional mobility using RW with CGA +increased time d/t decreased gait speed. EOB>supine supervision. Pt was left resting in bed with call bell in reach, bed alarm on, and all needs met.    PM Session:  Pt received sitting up in bed presenting to be fatigued dressed for the day with thigh high TEDs donned. Pt reporting he had been experiencing nausea since lunch, however stating he would like to try to do some light therapy today.  Pt reporting 0/10 pain- OT offering intermittent rest breaks, repositioning, and therapeutic support to optimize participation in therapy session. Pt transitioned to EOB using bed features with supervision. Donned slip on shoes set-up assist. Pt then requesting to use restroom. Sit>stand from elevated EOB supervision using RW. Pt completed functional mobility to bathroom ~15 ft with CGA using RW d/t Pt shuffling feet fatigue. Provide increased time on toilet d/t Pt reporting need for BM, however no BM at this time (continent void and gas only with nursing staff informed). Pt completed 3/3 toileting task with CGA this trail using RW for balance during clothing management. Pt ambulated back to his room and requested to return to bed- provided increased time for rest break sitting EOB and assessed vitals- BP 114/50(70 HR 87 PRO2 98%. Pt did not report dizziness, however stating he felt extremely fatigued and nauseated. Pt requesting to end session early; Missed 17 minutes of skilled OT treatment d/t Pt fatigue and nausea symptoms. Will attempt to make up time as  schedule and Pt's status allow. Pt returned to bed with supervision using bed features. Pt was left resting in bed with call bell in reach, bed alarm on, and all needs met.    Therapy  Documentation Precautions:  Precautions Precautions: Fall, Other (comment) Precaution Comments: Chronic vertigo; HOH Restrictions Weight Bearing Restrictions Per Provider Order: No  Therapy/Group: Individual Therapy  Clide Deutscher 05/05/2023, 8:01 AM

## 2023-05-05 NOTE — Progress Notes (Signed)
Physical Therapy Session Note  Patient Details  Name: John [name] MRN: [id number] Date of Birth: 1945/10/21  Today's Date: 05/05/2023 PT Individual Time: 0807-0904 PT Individual Time Calculation (min): 57 min   Short Term Goals: Week 2:  PT Short Term Goal 1 (Week 2): = to LTGs based on ELOS  Skilled Therapeutic Interventions/Progress Updates:    Pt received sitting in w/c and agreeable to therapy session. Pt reports not sleeping well at all last night and feeling fatigued this morning.  Vitals assessed throughout as follows:  - in sitting, at beginning: BP 119/60 (MAP 78), HR 88bpm  - in standing, BP 85/56 (MAP 65), HR 90bpm with pt reporting feeling lightheadedness  Donned thigh high TED hose - reassessed vitals in sitting: BP 109/62 (MAP 77), HR 83bpm  - then in standing with thigh high TEDs: BP 93/48 (MAP 61), HR 91bpm with pt reporting a small amount of lightheadedness  *MD made aware of pt's vitals and symptoms   *educated pt on likely need to wear thigh high TED hose at home for BP management, pt already asks for water throughout therapy sessions and provided, so aware of need to increase oral intake  * educated pt on need to wear sneakers/tennis shoes at home, not sandals, for fall risk safety   Sit<>stand w/c<>RW with supervision for safety during the above vital sign assessments. Pt maintains standing balance during vital assessment with supervision using only 1 UE support on RW.  Gait training ~151ft to main therapy gym using RW with close SBA for safety and therapist bringing w/c follow in event of fatigue and to allow seated break in gym - continues to have min excessive forward trunk flexion with increased WBing through RW - has a few instances of minor balance instability but pt is able to utilize AD and stepping strategy to maintain balance.   Vitals once seated in gym, immediately following gait: BP 129/61 (MAP 79), HR 88bpm   Participated in Timed Up and Go (TUG)  -  using RW with close SBA for balance safety (pt continues to have minor balance instability when turning but uses AD and stepping strategy to remain upright without hands-on assistance): 1st trial: 45.62 seconds - difficulty coming to stand and didn't remember the need to turn and sit at the end 2nd trial: 24.14 seconds - continues to have wide, long turn at the end  3rd trial: 25.14 seconds - continues to have wide turn at end delaying his time  Patient demonstrates high fall risk as indicated by requiring >13.5seconds to complete the TUG.   Pt participated in re-assessment of his strength and sensation in preparation for upcoming D/C.   Gait training ~158ft using RW back to room with close SBA again as described above.   At end of session, pt left seated in w/c with needs in reach and seat belt alarm on.    Therapy Documentation Precautions:  Precautions Precautions: Fall, Other (comment) Precaution Comments: Chronic vertigo; HOH Restrictions Weight Bearing Restrictions Per Provider Order: No   Pain:  Denies pain during session.    Therapy/Group: Individual Therapy  Ginny Forth , PT, DPT, NCS, CSRS 05/05/2023, 7:45 AM

## 2023-05-05 NOTE — Progress Notes (Addendum)
PROGRESS NOTE   Subjective/Complaints:  No issues overnite, discussed revised discharge date , dizziness and orthostatic drop noted in therapy   ROS: . Denies CP, SOB, abd pain, Objective:   No results found.  No results for input(s): "WBC", "HGB", "HCT", "PLT" in the last 72 hours.   No results for input(s): "NA", "K", "CL", "CO2", "GLUCOSE", "BUN", "CREATININE", "CALCIUM" in the last 72 hours.    Intake/Output Summary (Last 24 hours) at 05/05/2023 0744 Last data filed at 05/04/2023 1827 Gross per 24 hour  Intake 716 ml  Output --  Net 716 ml         Physical Exam: Vital Signs Blood pressure (!) 148/63, pulse 81, temperature (!) 97.5 F (36.4 C), resp. rate 18, height 6' (1.829 m), weight 101.9 kg, SpO2 97%. Physical Exam General: No acute distress, getting up in bed to work with PT.  Vital signs reviewed Mood and affect are appropriate Heart: Regular rate and rhythm no rubs murmurs or extra sounds Chest - s/p right masectomy  Lungs: Clear to auscultation, breathing unlabored, no rales or wheezes, on RA Abdomen: Positive bowel sounds, soft nontender to palpation, nondistended Extremities: No clubbing, cyanosis, or edema Skin: No evidence of breakdown, no evidence of rash over exposed surfaces   Neurologic: Cranial nerves II through XII grossly intact, motor strength is 5/5 in bilateral deltoid, bicep, tricep, grip, hip flexor, knee extensors, ankle dorsiflexor and plantar flexor    Musculoskeletal: Full range of motion in all 4 extremities. No joint swelling     Assessment/Plan: 1. Functional deficits which require 3+ hours per day of interdisciplinary therapy in a comprehensive inpatient rehab setting. Physiatrist is providing close team supervision and 24 hour management of active medical problems listed below. Physiatrist and rehab team continue to assess barriers to discharge/monitor patient progress  toward functional and medical goals  Care Tool:  Bathing    Body parts bathed by patient: Right arm, Left arm, Chest, Abdomen, Front perineal area, Right upper leg, Left upper leg, Face, Buttocks, Left lower leg, Right lower leg   Body parts bathed by helper: Buttocks, Left lower leg, Right lower leg     Bathing assist Assist Level: Minimal Assistance - Patient > 75%     Upper Body Dressing/Undressing Upper body dressing   What is the patient wearing?: Pull over shirt    Upper body assist Assist Level: Supervision/Verbal cueing    Lower Body Dressing/Undressing Lower body dressing      What is the patient wearing?: Pants     Lower body assist Assist for lower body dressing: Minimal Assistance - Patient > 75%     Toileting Toileting Toileting Activity did not occur (Clothing management and hygiene only): N/A (no void or bm)  Toileting assist Assist for toileting: Minimal Assistance - Patient > 75%     Transfers Chair/bed transfer  Transfers assist     Chair/bed transfer assist level: Minimal Assistance - Patient > 75%     Locomotion Ambulation   Ambulation assist      Assist level: Minimal Assistance - Patient > 75% Assistive device: Walker-rolling Max distance: 58   Walk 10 feet activity   Assist  Assist level: Minimal Assistance - Patient > 75% Assistive device: Walker-rolling   Walk 50 feet activity   Assist    Assist level: Minimal Assistance - Patient > 75% Assistive device: Walker-rolling    Walk 150 feet activity   Assist Walk 150 feet activity did not occur: Safety/medical concerns  Assist level: Minimal Assistance - Patient > 75% Assistive device: Walker-rolling    Walk 10 feet on uneven surface  activity   Assist Walk 10 feet on uneven surfaces activity did not occur: Safety/medical concerns         Wheelchair     Assist Is the patient using a wheelchair?: Yes (for transport b/c of impaired activity  tolerance limiting gait distance) Type of Wheelchair: Manual    Wheelchair assist level: Dependent - Patient 0%      Wheelchair 50 feet with 2 turns activity    Assist        Assist Level: Dependent - Patient 0%   Wheelchair 150 feet activity     Assist      Assist Level: Dependent - Patient 0%   Blood pressure (!) 148/63, pulse 81, temperature (!) 97.5 F (36.4 C), resp. rate 18, height 6' (1.829 m), weight 101.9 kg, SpO2 97%.  Medical Problem List and Plan: 1. 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  Antithrombotics: -DVT/anticoagulation:  Mechanical: Antiembolism stockings, thigh (TED hose) Bilateral lower extremities -antiplatelet therapy: Aspirin 81 mg daily and Plavix 75 mg daily x 3 weeks then Plavix alone 3. Pain Management: Tylenol as needed 4. Mood/Behavior/Sleep: Not emotional support             -antipsychotic agents: N/A ? Post CVA depression, trial buproprion given hx of HA with sertraline   5. Neuropsych/cognition: This patient is capable of making decisions on his own behalf. 6. Skin/Wound Care: Routine skin checks 7. Fluids/Electrolytes/Nutrition: Routine in and outs with follow-up chemistries 8.  Hypertension.  Norvasc 5 mg daily.  Monitor with increased mobility  -1/11 intermittently elevated continue to monitor -04/24/23 BPs fine, cont regimen; of note, pharmacy stated he was on furosemide 80mg  daily but down to 40mg  daily on inpatient, but not resumed here  Increase amlodipine to 10mg  on 1/14 monitor  - overall improved ,mildly elevated systolic this am  -05/04/23  BP stable continue current regimen  Vitals:   05/02/23 0421 05/02/23 1402 05/02/23 1946 05/02/23 1949  BP: (!) 141/72 (!) 123/55 129/67 129/67   05/03/23 0514 05/03/23 0801 05/03/23 1417 05/03/23 2206  BP: 133/71 131/75 137/67 126/60   05/04/23 0626 05/04/23 1302 05/04/23 1951 05/05/23 0306  BP: 135/64 125/63 (!) 141/69 (!) 148/63  Orthostatic hypotension reduce amlodipine, will need to let BP run higher to compensate for orthostasis   9.  Diabetes mellitus peripheral neuropathy.  Latest hemoglobin A1c 12.3.  Currently on NovoLog 70/30 22 units twice daily.  Diabetic teaching.  Patient's home regimen of Glucophage 1000 mg twice daily as well as Toujeo  15 units in the morning and 70 units in the evening-- currently held.  Resume as needed 1/11 CBGs mildly elevated, restart metformin XR at 500 mg dose, continue current dose of 70/30 for now -04/24/23 CBGs in the 200s, recent med adjustment, monitor for trend with that change, but suspect he'll likely need further adjustments -Increase novalog 70/30 to 22U BID first dose 1/13 pm , improved CBG this am , may be able to d/c metformin  -1/17 CBGs controlled continue current -04/30/23 CBGs fine, not eating much, SSI d/c'd for now; getting IVFs today; monitor for now, but suspect CBGs will be less d/t poor PO intake -05/05/23 CBGs controlled cont metformin and 70/30 insulin  CBG (last 3)  Recent Labs    05/04/23 1627 05/04/23 2141 05/05/23 0640  GLUCAP 126* 120* 130*   10.  Chronic vertigo.  Continue Antivert 12.5 mg 3 times daily prn.  Patient followed outpatient by ENT   11.  BPH.  Proscar 5 mg daily, Flomax 0.4 mg daily.  Check PVR.  Followed by urology Dr. Tobie Lords [facility]  -1/11 continent continue current regimen    12.  History of breast cancer.  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  Status post modified radical mastectomy and chemotherapy.  Patient on tamoxifen 20mg  daily since April 2020 follow-up with outpatient oncology Dr  Allison Quarry Vallathucherry  Have resumed tamoxifen  13.  GERD.  Protonix 40mg  daily   14.  AKI on CKD.  Creatinine baseline 1.29-1.64.  Follow-up chemistries  -    Latest Ref Rng & Units 05/02/2023    4:56 AM 04/30/2023    5:52 AM 04/28/2023    5:08 PM  BMP  Glucose 70 - 99 mg/dL 409  811  914   BUN 8 - 23 mg/dL 13  11  13    Creatinine 0.61 - 1.24 mg/dL 7.82  9.56  2.13   Sodium 135 - 145 mmol/L 132  132  133   Potassium 3.5 - 5.1 mmol/L 4.7  4.3  4.2   Chloride 98 - 111 mmol/L 101  99  100   CO2 22 - 32 mmol/L 19  20  20    Calcium 8.9 - 10.3 mg/dL 9.0  9.1  9.1      15.  Obesity.  BMI 30.45.  Dietary follow-up  16. Constipation: on miralax BID- large cont liquid BM this am  -04/24/23 reports LBM was yesterday or two days ago, no documentation of BMs on file-- also refusing miralax; if no BM by tomorrow, would consider encouraging pt to use miralax, or using alternative bowel regimen that he'd be willing to use.  -05/01/23 LBM 2 days ago but poor PO intake last couple days, monitor for now  17. Cough/nausea/Metapneumovirus- on droplet precautions  -CXR ordered-no acute changes  -WBC within normal limits on CBC 1/16 and 1/18   -1/17 respiratory panel is pending- had low grade temp 1/17 and 1/18 now resolved   Encourage use of flutter valve -      18.  Mildly elevated LFTs  -Monitor Tylenol usage, recheck Monday    LOS: 13 days A FACE TO FACE EVALUATION WAS PERFORMED  Erick Colace 05/05/2023, 7:44 AM

## 2023-05-06 ENCOUNTER — Other Ambulatory Visit (HOSPITAL_COMMUNITY): Payer: Self-pay

## 2023-05-06 LAB — GLUCOSE, CAPILLARY
Glucose-Capillary: 200 mg/dL — ABNORMAL HIGH (ref 70–99)
Glucose-Capillary: 204 mg/dL — ABNORMAL HIGH (ref 70–99)
Glucose-Capillary: 168 mg/dL — ABNORMAL HIGH (ref 70–99)
Glucose-Capillary: 141 mg/dL — ABNORMAL HIGH (ref 70–99)

## 2023-05-06 MED ORDER — AMLODIPINE BESYLATE 5 MG PO TABS
5.0000 mg | ORAL_TABLET | Freq: Every day | ORAL | 0 refills | Status: DC
  Filled 2023-05-06: qty 30, 30d supply, fill #0

## 2023-05-06 NOTE — Progress Notes (Signed)
Inpatient Rehabilitation Care Coordinator Discharge Note DC SAT 1/25  Patient Details  Name: John [name] MRN: [id number] Date of Birth: 28-Jul-1945   Discharge location: HOME WITH WIFE WHO CAN PROVIDE SUPERVISION LEVEL  Length of Stay: 15 DAYS  Discharge activity level: SUPERVISION LEVEL  Home/community participation: HOMEBOUND  Patient response WU:[id number] Literacy - How often do you need to have someone help you when you read instructions, pamphlets, or other written material from your doctor or pharmacy?: Never  Patient response YN:WGNFAO Isolation - How often do you feel lonely or isolated from those around you?: Never  Services provided included: MD, RD, PT, OT, RN, CM, Pharmacy, Neuropsych, SW  Financial Services:  Field seismologist Utilized: Marine scientist MEDICARE  Choices offered to/list presented to: PT AND WIFE  Follow-up services arranged:  Home Health, DME, Patient/Family has no preference for HH/DME agencies Home Health Agency: [facility]  PT  OT  RN    DME : [employer] [facility] ROLLING WALKER  WIFE PURSING LIFT CHAIR AND STAIR LIFT FOR PT AWARE INSURANCE DOES NOT COVER THE COST OF EITHER  Patient response to transportation need: Is the patient able to respond to transportation needs?: Yes In the past 12 months, has lack of transportation kept you from medical appointments or from getting medications?: No In the past 12 months, has lack of transportation kept you from meetings, work, or from getting things needed for daily living?: No   Patient/Family verbalized understanding of follow-up arrangements:  Yes  Individual responsible for coordination of the follow-up plan: SELF AND John [name]-WIFE [phone]  Confirmed correct DME delivered: John [name] 05/06/2023    Comments (or additional information): WIFE WAS HERE FRIDAY FOR HANDS ON EDUCATION AND FELT CAN MANAGE HIM AT HOME.   Summary of Stay    Date/Time Discharge Planning CSW   05/04/23 [phone] Wife here yesterday and feels along with pt discharge date is too soon and he has not met his goals. Both want an extension. Wife can provide supervision but says can not provide care and it is just here. Observed in PT session yesterday RGD  04/27/23 0824 HOme with wife who can provide supervision level, currently she is sick. with his hx of falls wife wants lift chair and stair lift gave prescriptions to pt RGD       John [name], John [name]

## 2023-05-06 NOTE — Plan of Care (Signed)
  Problem: RH Tub/Shower Transfers Goal: LTG Patient will perform tub/shower transfers w/assist (OT) Description: LTG: Patient will perform tub/shower transfers with assist, with/without cues using equipment (OT) Outcome: Not Met (add Reason) Note: Pt requires close supervision to CGA for shower transfer for safety d/t increased balance challenge when stepping over threshold.    Problem: RH Balance Goal: LTG Patient will maintain dynamic standing with ADLs (OT) Description: LTG:  Patient will maintain dynamic standing balance with assist during activities of daily living (OT)  Outcome: Completed/Met   Problem: RH Bathing Goal: LTG Patient will bathe all body parts with assist levels (OT) Description: LTG: Patient will bathe all body parts with assist levels (OT) Outcome: Completed/Met   Problem: RH Dressing Goal: LTG Patient will perform upper body dressing (OT) Description: LTG Patient will perform upper body dressing with assist, with/without cues (OT). Outcome: Completed/Met Goal: LTG Patient will perform lower body dressing w/assist (OT) Description: LTG: Patient will perform lower body dressing with assist, with/without cues in positioning using equipment (OT) Outcome: Completed/Met   Problem: RH Toileting Goal: LTG Patient will perform toileting task (3/3 steps) with assistance level (OT) Description: LTG: Patient will perform toileting task (3/3 steps) with assistance level (OT)  Outcome: Completed/Met   Problem: RH Simple Meal Prep Goal: LTG Patient will perform simple meal prep w/assist (OT) Description: LTG: Patient will perform simple meal prep with assistance, with/without cues (OT). Outcome: Completed/Met   Problem: RH Toilet Transfers Goal: LTG Patient will perform toilet transfers w/assist (OT) Description: LTG: Patient will perform toilet transfers with assist, with/without cues using equipment (OT) Outcome: Completed/Met

## 2023-05-06 NOTE — Progress Notes (Signed)
Patient ID: John [name], male   DOB: July 05, 1945, 78 y.o.   MRN: [id number]  Wife here to attend therapies with husband and feels [employer] will not assist with the lift chair so just forget the paperwork she needed. Pt's rolling walker to be delivered to room today and will be ready to go home tomorrow.

## 2023-05-06 NOTE — Progress Notes (Signed)
Physical Therapy Session Note  Patient Details  Name: John [name] MRN: [id number] Date of Birth: 12-Feb-1946  Today's Date: 05/06/2023 PT Individual Time: 7829-5621 PT Individual Time Calculation (min): 44 min   Short Term Goals: Week 1:  PT Short Term Goal 1 (Week 1): Pt will perform sit<>stands using LRAD with CGA PT Short Term Goal 1 - Progress (Week 1): Met PT Short Term Goal 2 (Week 1): Pt will perform bed<>chair transfers using LRAD with CGA PT Short Term Goal 2 - Progress (Week 1): Met PT Short Term Goal 3 (Week 1): Pt will ambulate at least 147ft using LRAD with CGA PT Short Term Goal 3 - Progress (Week 1): Met PT Short Term Goal 4 (Week 1): Pt will navigate at least 8 steps using HRs with CGA PT Short Term Goal 4 - Progress (Week 1): Met Week 2:  PT Short Term Goal 1 (Week 2): = to LTGs based on ELOS Week 3:     Skilled Therapeutic Interventions/Progress Updates:      Therapy Documentation Precautions:  Precautions Precautions: Fall, Other (comment) Precaution Comments: Chronic vertigo; HOH; monitor for orthostatic hypotension (wears thigh high TED hose) Restrictions Weight Bearing Restrictions Per Provider Order: No General:   Vital Signs:   Pain: Pain Assessment Pain Scale: 0-10 Pain Score: 0-No pain Mobility: Bed Mobility Bed Mobility: Sit to Supine;Supine to Sit Supine to Sit: Independent with assistive device Sit to Supine: Independent with assistive device Transfers Transfers: Sit to Stand;Stand to Sit;Stand Pivot Transfers Sit to Stand: Independent with assistive device Stand to Sit: Independent with assistive device Stand Pivot Transfers: Independent with assistive device Transfer (Assistive device): Rolling walker Locomotion :    Trunk/Postural Assessment : Cervical Assessment Cervical Assessment: Exceptions to [facility] (slight forward head) Thoracic Assessment Thoracic Assessment: Exceptions to [facility] (rounded shoudlers) Lumbar Assessment Lumbar  Assessment: Within Functional Limits Postural Control Postural Control: Deficits on evaluation Trunk Control: Decreased control when BLE not supported on floor. Postural Limitations: decreased with posterior lean bias, but improved with use of RW  Balance: Balance Balance Assessed: Yes Static Sitting Balance Static Sitting - Balance Support: Feet supported Static Sitting - Level of Assistance: 6: Modified independent (Device/Increase time) Dynamic Sitting Balance Dynamic Sitting - Balance Support: Feet supported Dynamic Sitting - Level of Assistance: 6: Modified independent (Device/Increase time) Static Standing Balance Static Standing - Balance Support: During functional activity;Bilateral upper extremity supported Static Standing - Level of Assistance: 6: Modified independent (Device/Increase time) Dynamic Standing Balance Dynamic Standing - Balance Support: During functional activity;Bilateral upper extremity supported Dynamic Standing - Level of Assistance: 6: Modified independent (Device/Increase time);5: Stand by assistance (mod I-CGA depending on fatigue level) Exercises:   Other Treatments:      Therapy/Group: Individual Therapy  Loel Dubonnet PT, DPT, CSRS 05/06/2023, 6:21 PM

## 2023-05-06 NOTE — Progress Notes (Signed)
Occupational Therapy Discharge Summary  Patient Details  Name: John [name] MRN: [id number] Date of Birth: [birth year]  Date of Discharge from OT service:May 06, 2023  Today's Date: 05/06/2023 OT Individual Time: 1445-1530 OT Individual Time Calculation (min): 45 min    Patient has met 7 of 8 long term goals due to improved activity tolerance, improved balance, postural control, ability to compensate for deficits, improved attention, improved awareness, and improved coordination.  Patient to discharge at overall Modified Independent -CGA level.  Patient's care partner is independent to provide the necessary physical and cognitive assistance at discharge.    Reasons goals not met: Pt requires close supervision to CGA for shower transfer for safety d/t increased balance challenge when stepping over threshold.   Recommendation:  Patient will benefit from ongoing skilled OT services in home health setting to continue to advance functional skills in the area of BADL, iADL, and Reduce care partner burden.  Equipment: No equipment provided  Reasons for discharge: treatment goals met and discharge from hospital  Patient/family agrees with progress made and goals achieved: Yes  OT Discharge Skilled Therapeutic Interventions/Progress Updates:  Pt received semi-reclined in bed presenting to be in good spirits receptive to skilled OT session reporting 0/10 pain- OT offering intermittent rest breaks, repositioning, and therapeutic support to optimize participation in therapy session. Focus this session family education in preparation for d/c home. Provided education on CVA etiology/recovery, fall prevention, energy conservation techniques, and simple home modifications for home safety. Engaged Pt and Pt's wife in d/c based discussion with focus on home set-up and accessibility with both Pt and Pt's caregiver able to verbalize impact of CVA on his functional status and how to set-up environment  for increased safety. Education provided on follow-up therapy recommendations (HHOT) and recommendations to utilize shower seat, grab bars, and BSC at home with both Pt and caregiver receptive to plan. Engaged Pt in completing functional toilet transfers, functional mobility, and shower transfers with caregiver providing hands-on assistance and OT providing feedback to increase safety- Pt's caregiver able to safely assist Pt with good insight into his deficits and safety considerations. Pt and Pt's wife reporting all questions met at end of session. Pt was left resting in bed with call bell in reach, bed alarm on, and all needs met.   Precautions/Restrictions  Precautions Precautions: Fall;Other (comment) Precaution Comments: Chronic vertigo; HOH; monitor for orthostatic hypotension (wears thigh high TED hose) Restrictions Weight Bearing Restrictions Per Provider Order: No Pain Pain Assessment Pain Scale: 0-10 Pain Score: 0-No pain ADL ADL Eating: Independent Where Assessed-Eating: Edge of bed Grooming: Modified independent Where Assessed-Grooming: Edge of bed Upper Body Bathing: Modified independent Where Assessed-Upper Body Bathing: Shower Lower Body Bathing: Modified independent Where Assessed-Lower Body Bathing: Shower Upper Body Dressing: Modified independent (Device) Where Assessed-Upper Body Dressing: Edge of bed Lower Body Dressing: Modified independent Where Assessed-Lower Body Dressing: Edge of bed Toileting: Modified independent Where Assessed-Toileting: Neurosurgeon Method: Proofreader: Bedside commode (BSC over toilet, RW) Tub/Shower Transfer: Not assessed Film/video editor: Contact guard, Close supervision Film/video editor Method: Designer, industrial/product: Grab bars, Shower seat without back Vision Baseline Vision/History: 1 Wears glasses Patient Visual Report: No change  from baseline Vision Assessment?: No apparent visual deficits Perception  Perception: Within Functional Limits Praxis Praxis: WFL Cognition Cognition Overall Cognitive Status: Within Functional Limits for tasks assessed Arousal/Alertness: Awake/alert Orientation Level: Person;Place;Situation Person: Oriented Place: Oriented Situation: Oriented Memory: Impaired Memory Impairment: Storage deficit;Retrieval  deficit Focused Attention: Appears intact Sustained Attention: Appears intact Awareness: Appears intact Problem Solving: Appears intact Safety/Judgment: Appears intact Brief Interview for Mental Status (BIMS) Repetition of Three Words (First Attempt): 3 Temporal Orientation: Year: Correct Temporal Orientation: Month: Accurate within 5 days Temporal Orientation: Day: Correct Recall: "Sock": Yes, no cue required Recall: "Blue": Yes, no cue required Recall: "Bed": Yes, no cue required BIMS Summary Score: 15 Sensation Sensation Light Touch: Impaired Detail Peripheral sensation comments: 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 Light Touch Impaired Details: Impaired RUE;Impaired LUE;Impaired RLE;Impaired LLE Hot/Cold: Appears Intact Proprioception: Appears Intact Stereognosis: Not tested Coordination Gross Motor Movements are Fluid and Coordinated: No Fine Motor Movements are Fluid and Coordinated: No Coordination and Movement Description: Deficits due to generalized deconditioning and impaired endurance along with impaired balance from neuropathy and delayed balance strategies Finger Nose Finger Test: Mild undershooting and depth perception deficits Motor  Motor Motor - Discharge Observations: continues to have generalized weakness and impaired endurance along with impaired balance from neuropathy resulting in delayed balance strategies, but improved from initial eval Mobility  Bed Mobility Bed Mobility: Sit to Supine;Supine to Sit Supine to Sit:  Independent with assistive device Sit to Supine: Independent with assistive device Transfers Sit to Stand: Independent with assistive device Stand to Sit: Independent with assistive device  Trunk/Postural Assessment  Cervical Assessment Cervical Assessment: Exceptions to [facility] (slight forward head) Thoracic Assessment Thoracic Assessment: Exceptions to [facility] (rounded shoudlers) Lumbar Assessment Lumbar Assessment: Within Functional Limits Postural Control Postural Control: Deficits on evaluation Trunk Control: Decreased control when BLE not supported on floor. Postural Limitations: decreased with posterior lean bias, but improved with use of RW  Balance Balance Balance Assessed: Yes Static Sitting Balance Static Sitting - Balance Support: Feet supported Static Sitting - Level of Assistance: 6: Modified independent (Device/Increase time) Dynamic Sitting Balance Dynamic Sitting - Balance Support: Feet supported Dynamic Sitting - Level of Assistance: 6: Modified independent (Device/Increase time) Static Standing Balance Static Standing - Balance Support: During functional activity;Bilateral upper extremity supported Static Standing - Level of Assistance: 6: Modified independent (Device/Increase time) Dynamic Standing Balance Dynamic Standing - Balance Support: During functional activity;Bilateral upper extremity supported Dynamic Standing - Level of Assistance: 6: Modified independent (Device/Increase time);5: Stand by assistance (mod I-CGA depending on fatigue level) Extremity/Trunk Assessment RUE Assessment RUE Assessment: Exceptions to [facility] Active Range of Motion (AROM) Comments: WFL General Strength Comments: WFL grossly; 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   John [name]  [place] 05/06/2023, 4:25 PM

## 2023-05-06 NOTE — Progress Notes (Signed)
Physical Therapy Session Note  Patient Details  Name: John [name] MRN: [id number] Date of Birth: 02-13-1946  Today's Date: 05/06/2023 PT Individual Time: 0920-1004 PT Individual Time Calculation (min): 44 min   Short Term Goals: Week 1:  PT Short Term Goal 1 (Week 1): Pt will perform sit<>stands using LRAD with CGA PT Short Term Goal 1 - Progress (Week 1): Met PT Short Term Goal 2 (Week 1): Pt will perform bed<>chair transfers using LRAD with CGA PT Short Term Goal 2 - Progress (Week 1): Met PT Short Term Goal 3 (Week 1): Pt will ambulate at least 130ft using LRAD with CGA PT Short Term Goal 3 - Progress (Week 1): Met PT Short Term Goal 4 (Week 1): Pt will navigate at least 8 steps using HRs with CGA PT Short Term Goal 4 - Progress (Week 1): Met Week 2:  PT Short Term Goal 1 (Week 2): = to LTGs based on ELOS  Skilled Therapeutic Interventions/Progress Updates:  Patient supine in bed on entrance to room. Patient alert and agreeable to PT session. Slightly hesitant to work with new therapist on final day but does relate appreciation for team PT and OT who he credits for all progress. Reminded pt to credit his own efforts as well. Educated that those who do not put in the effort, do not progress as he has.   Patient with no pain complaint at start of session.  Therapeutic Activity: Bed Mobility: Pt performed supine > sit with Mod I/ supervision d/t generalized weakness from respiratory infection. VC/ tc required for effort and to coordinate breathing to effort. At end of session, pt is able to return to supine with Mod I.   While seated EOB, pt is able to dress self with setup of pants, then shirt. Shoes donned with MinA for time.  Transfers: Pt performed sit<>stand at start of session from low bed surface requiring several attempts and use of BLE knee extension to assist with posterior bias in order to attain balance. Improves during session and with slightly taller seat heights. Stand  pivot transfers throughout session with supervision. Provided vc/ tc forhip hinge rather than forward lean to improve balance shift forward to stand. Pt stands to toilet in bathroom and has continent void. Provided with supervision to complete.   Gait Training/NMR:  Pt ambulated >200 ft using RW with supervision initially and then light CGA to complete. Demonstrated flexed posture with slight variation in step length throughout but overall maintains slow, but consistent pace.   Has strength to complete one bout of  four 6-in steps with supervision/ light CGA intermittently d/t fatigue/ weakness.   NMR facilitated during session with focus on dynamic gait. Pt guided in Functional Gait Assessment. Patient demonstrates increased fall risk as noted by score of 16/30 on  Functional Gait Assessment indicating a high risk for falls. See test below. <22/30 = predictive of falls, <20/30 = fall in 6 months, <18/30 = predictive of falls in PD MCID: 5 points stroke population, 4 points geriatric population (ANPTA Core Set of Outcome Measures for Adults with Neurologic Conditions, 2018)   NMR performed for improvements in motor control and coordination, balance, sequencing, judgement, and self confidence/ efficacy in performing all aspects of mobility at highest level of independence.   Patient supine in bed at end of session with brakes locked, bed alarm set, and all needs within reach.   Therapy Documentation Precautions:  Precautions Precautions: Fall, Other (comment) Precaution Comments: Chronic vertigo; HOH; monitor for  orthostatic hypotension (wears thigh high TED hose) Restrictions Weight Bearing Restrictions Per Provider Order: No  Pain: Pain Assessment Pain Scale: 0-10 Pain Score: 0-No pain  Balance: Functional Gait  Assessment Gait assessed : Yes Gait Level Surface: Walks 20 ft in less than 7 sec but greater than 5.5 sec, uses assistive device, slower speed, mild gait deviations, or  deviates 6-10 in outside of the 12 in walkway width. Change in Gait Speed: Able to smoothly change walking speed without loss of balance or gait deviation. Deviate no more than 6 in outside of the 12 in walkway width. Gait with Horizontal Head Turns: Performs head turns with moderate changes in gait velocity, slows down, deviates 10-15 in outside 12 in walkway width but recovers, can continue to walk. Gait with Vertical Head Turns: Performs task with slight change in gait velocity (eg, minor disruption to smooth gait path), deviates 6 - 10 in outside 12 in walkway width or uses assistive device Gait and Pivot Turn: Turns slowly, requires verbal cueing, or requires several small steps to catch balance following turn and stop Step Over Obstacle: Is able to step over one shoe box (4.5 in total height) without changing gait speed. No evidence of imbalance. Gait with Narrow Base of Support: Ambulates less than 4 steps heel to toe or cannot perform without assistance. Gait with Eyes Closed: Walks 20 ft, slow speed, abnormal gait pattern, evidence for imbalance, deviates 10-15 in outside 12 in walkway width. Requires more than 9 sec to ambulate 20 ft. Ambulating Backwards: Walks 20 ft, uses assistive device, slower speed, mild gait deviations, deviates 6-10 in outside 12 in walkway width. Steps: Alternating feet, must use rail. Total Score: 16 FGA comment:: high risk for falls.   Therapy/Group: Individual Therapy  Loel Dubonnet PT, DPT, CSRS 05/06/2023, 9:16 AM

## 2023-05-06 NOTE — Progress Notes (Signed)
Inpatient Rehabilitation Discharge Medication Review by a Pharmacist  A complete drug regimen review was completed for this patient to identify any potential clinically significant medication issues.  High Risk Drug Classes Is patient taking? Indication by Medication  Antipsychotic No   Anticoagulant No   Antibiotic No   Opioid No   Antiplatelet Yes Aspirin 81 mg, clopidogrel x 3 wks (begun 04/20/23), then clopidogrel alone - CVA prophylaxis  Hypoglycemics/insulin Yes Insulin aspart and Toujeo, metformin - DM Type 2  Vasoactive Medication Yes Amlodipine, Furosemide - hypertension  Chemotherapy Yes Tamoxifen - hx breast cancer  Other Yes Atorvastatin - hyperlipidemia Bupropion - post-CVA depression Finasteride, Tamsulosin - BPH Pantoprazole - GERD Multivitamin, Vitamin C, potassium chloride - supplements Miralax - laxative Triamcinolone/Eucerin cream - rash on back  PRNs: Acetaminophen - pain Guaifenesin solution - cough, to loosen phlegm Meclizine - dizziness     Type of Medication Issue Identified Description of Issue Recommendation(s)  Drug Interaction(s) (clinically significant)     Duplicate Therapy     Allergy     No Medication Administration End Date  Aspirin and clopidogrel x 3 weeks, then clopidogrel alone.  Clopidogrel load given 1/8, then 75 mg daily begun 1/9. Aspirin to stop 05/11/23.  Incorrect Dose     Additional Drug Therapy Needed     Significant med changes from prior encounter (inform family/care partners about these prior to discharge). Aspirin dose decreased, and to stop after 21 days with Clopidogrel.  Amlodipine dose reduced. Atorvastatin dose increased. New bupropion and pantoprazole (in place of Omeprazole).  Furosemide was to resume at lower dose (40 mg) per dc summary but not continued on CIR. On KCl while off furosemide.  Toujeo discontinued while inpatient. Insulin 70/30 and aspart during CIR  stay.  Zinc discontinued while inpatient  (reported not taking). Communicate changes with patient/family prior to discharge.       Resuming prior furosemide dose at discharge.  KCl also resuming.    Resuming Toujeo and aspart at discharge.  Other       Clinically significant medication issues were identified that warrant physician communication and completion of prescribed/recommended actions by midnight of the next day:  No  Name of provider notified for urgent issues identified:   Provider Method of Notification:   Pharmacist comments:      - Furosemide 80 mg daily, but resumed with 40 mg PO daily x 1 on 1/10 inpatient, but none since.  Resuming at discharge, with KCl supplement.  Time spent performing this drug regimen review (minutes):  [address]   Dennie Fetters, [place] 05/06/2023 3:46 PM

## 2023-05-06 NOTE — Progress Notes (Signed)
PROGRESS NOTE   Subjective/Complaints:  No issues overnite,  discussed diabetic management , states he has appt with endo (Dr Allena Katz) in March 2025   ROS: . Denies CP, SOB, abd pain, Objective:   No results found.  No results for input(s): "WBC", "HGB", "HCT", "PLT" in the last 72 hours.   No results for input(s): "NA", "K", "CL", "CO2", "GLUCOSE", "BUN", "CREATININE", "CALCIUM" in the last 72 hours.    Intake/Output Summary (Last 24 hours) at 05/06/2023 0747 Last data filed at 05/05/2023 1802 Gross per 24 hour  Intake 653 ml  Output --  Net 653 ml         Physical Exam: Vital Signs Blood pressure 135/68, pulse 84, temperature 98.2 F (36.8 C), resp. rate 16, height 6' (1.829 m), weight 101.9 kg, SpO2 94%. Physical Exam General: No acute distress, getting up in bed to work with PT.  Vital signs reviewed Mood and affect are appropriate Heart: Regular rate and rhythm no rubs murmurs or extra sounds Chest - s/p right masectomy  Lungs: Clear to auscultation, breathing unlabored, no rales or wheezes, on RA Abdomen: Positive bowel sounds, soft nontender to palpation, nondistended Extremities: No clubbing, cyanosis, or edema Skin: No evidence of breakdown, no evidence of rash over exposed surfaces   Neurologic: Cranial nerves II through XII grossly intact, motor strength is 5/5 in bilateral deltoid, bicep, tricep, grip, hip flexor, knee extensors, ankle dorsiflexor and plantar flexor    Musculoskeletal: Full range of motion in all 4 extremities. No joint swelling     Assessment/Plan: 1. Functional deficits which require 3+ hours per day of interdisciplinary therapy in a comprehensive inpatient rehab setting. Physiatrist is providing close team supervision and 24 hour management of active medical problems listed below. Physiatrist and rehab team continue to assess barriers to discharge/monitor patient progress  toward functional and medical goals  Care Tool:  Bathing    Body parts bathed by patient: Right arm, Left arm, Chest, Abdomen, Front perineal area, Right upper leg, Left upper leg, Face, Buttocks, Left lower leg, Right lower leg   Body parts bathed by helper: Buttocks, Left lower leg, Right lower leg     Bathing assist Assist Level: Minimal Assistance - Patient > 75%     Upper Body Dressing/Undressing Upper body dressing   What is the patient wearing?: Pull over shirt    Upper body assist Assist Level: Supervision/Verbal cueing    Lower Body Dressing/Undressing Lower body dressing      What is the patient wearing?: Pants     Lower body assist Assist for lower body dressing: Minimal Assistance - Patient > 75%     Toileting Toileting Toileting Activity did not occur (Clothing management and hygiene only): N/A (no void or bm)  Toileting assist Assist for toileting: Minimal Assistance - Patient > 75%     Transfers Chair/bed transfer  Transfers assist     Chair/bed transfer assist level: Contact Guard/Touching assist Chair/bed transfer assistive device: Walker, Archivist   Ambulation assist      Assist level: Contact Guard/Touching assist Assistive device: Walker-rolling Max distance: 112ft   Walk 10 feet activity   Assist  Assist level: Supervision/Verbal cueing Assistive device: Walker-rolling   Walk 50 feet activity   Assist    Assist level: Supervision/Verbal cueing Assistive device: Walker-rolling    Walk 150 feet activity   Assist Walk 150 feet activity did not occur: Safety/medical concerns  Assist level: Contact Guard/Touching assist Assistive device: Walker-rolling    Walk 10 feet on uneven surface  activity   Assist Walk 10 feet on uneven surfaces activity did not occur: Safety/medical concerns   Assist level: Contact Guard/Touching assist Assistive device: Walker-rolling    Wheelchair     Assist Is the patient using a wheelchair?: No Type of Wheelchair: Manual    Wheelchair assist level: Dependent - Patient 0%      Wheelchair 50 feet with 2 turns activity    Assist        Assist Level: Dependent - Patient 0%   Wheelchair 150 feet activity     Assist      Assist Level: Dependent - Patient 0%   Blood pressure 135/68, pulse 84, temperature 98.2 F (36.8 C), resp. rate 16, height 6' (1.829 m), weight 101.9 kg, SpO2 94%.  Medical Problem List and Plan: 1. 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  Antithrombotics: -DVT/anticoagulation:  Mechanical: Antiembolism stockings, thigh (TED hose) Bilateral lower extremities -antiplatelet therapy: Aspirin 81 mg daily and Plavix 75 mg daily x 3 weeks then Plavix alone 3. Pain Management: Tylenol as needed 4. Mood/Behavior/Sleep: Not emotional support             -antipsychotic agents: N/A ? Post CVA depression, trial buproprion given hx of HA with sertraline   5. Neuropsych/cognition: This patient is capable of making decisions on his own behalf. 6. Skin/Wound Care: Routine skin checks 7. Fluids/Electrolytes/Nutrition: Routine in and outs with follow-up chemistries 8.  Hypertension.  Norvasc 5 mg daily.  Monitor with increased mobility  -1/11 intermittently elevated continue to monitor -04/24/23 BPs fine, cont regimen; of note, pharmacy stated he was on furosemide 80mg  daily but down to 40mg  daily on inpatient, but not resumed here  Increase amlodipine to 10mg  on 1/14 monitor  - overall improved ,mildly elevated  systolic this am  -05/04/23 BP stable continue current regimen  Vitals:   05/02/23 1946 05/02/23 1949 05/03/23 0514 05/03/23 0801  BP: 129/67 129/67 133/71 131/75   05/03/23 1417 05/03/23 2206 05/04/23 0626 05/04/23 1302  BP: 137/67 126/60 135/64 125/63   05/04/23 1951 05/05/23 0306 05/05/23 1423 05/05/23 1958  BP: (!) 141/69 (!) 148/63 138/65 135/68  Orthostatic hypotension reduce amlodipine, will need to let BP run higher to compensate for orthostasis   9.  Diabetes mellitus peripheral neuropathy.  Latest hemoglobin A1c 12.3.  Currently on NovoLog 70/30 22 units twice daily.  Diabetic teaching.  Patient's home regimen of Glucophage 1000 mg twice daily as well as Toujeo 15 units in the morning and 70 units in the evening-- currently held.  Resume as needed 1/11 CBGs mildly elevated,  restart metformin XR at 500 mg dose, continue current dose of 70/30 for now -04/24/23 CBGs in the 200s, recent med adjustment, monitor for trend with that change, but suspect he'll likely need further adjustments -Increase novalog 70/30 to 22U BID first dose 1/13 pm , improved CBG this am , may be able to d/c metformin  -1/17 CBGs controlled continue current -04/30/23 CBGs fine, not eating much, SSI d/c'd for now; getting IVFs today; monitor for now, but suspect CBGs will be less d/t poor PO intake -05/06/23 CBGs controlled cont metformin and 70/30 insulin, may do this at home , has Endo consult in March   CBG (last 3)  Recent Labs    05/05/23 1644 05/05/23 2132 05/06/23 0645  GLUCAP 71 142* 200*   10.  Chronic vertigo.  Continue Antivert 12.5 mg 3 times daily prn.  Patient followed outpatient by ENT   11.  BPH.  Proscar 5 mg daily, Flomax 0.4 mg daily.  Check PVR.  Followed by urology Dr. Tobie Lords [facility]  -1/11 continent continue current regimen    12.  History of breast cancer.  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  Status post modified radical mastectomy and chemotherapy.  Patient on tamoxifen  20mg  daily since April 2020 follow-up with outpatient oncology Dr Allison Quarry Vallathucherry  Have resumed tamoxifen  13.  GERD.  Protonix 40mg  daily   14.  AKI on CKD.  Creatinine baseline 1.29-1.64.  Follow-up chemistries  -    Latest Ref Rng & Units 05/02/2023    4:56 AM 04/30/2023    5:52 AM 04/28/2023    5:08 PM  BMP  Glucose 70 - 99 mg/dL 161  096  045   BUN 8 - 23 mg/dL 13  11  13    Creatinine 0.61 - 1.24 mg/dL 4.09  8.11  9.14   Sodium 135 - 145 mmol/L 132  132  133   Potassium 3.5 - 5.1 mmol/L 4.7  4.3  4.2   Chloride 98 - 111 mmol/L 101  99  100   CO2 22 - 32 mmol/L 19  20  20    Calcium 8.9 - 10.3 mg/dL 9.0  9.1  9.1      15.  Obesity.  BMI 30.45.  Dietary follow-up  16. Constipation: on miralax BID- large cont liquid BM this am  -04/24/23 reports LBM was yesterday or two days ago, no documentation of BMs on file-- also refusing miralax; if no BM by tomorrow, would consider encouraging pt to use miralax, or using alternative bowel regimen that he'd be willing to use.  -05/01/23 LBM 2 days ago but poor PO intake last couple days, monitor for now  17. Cough/nausea/Metapneumovirus- on droplet precautions  -CXR ordered-no acute changes  -WBC within normal limits on CBC 1/16 and 1/18   -1/17 respiratory panel is pending- had low grade temp 1/17 and 1/18 now resolved   Encourage use of flutter valve -      18.  Mildly elevated LFTs  -Monitor Tylenol usage, recheck Monday    LOS: 14 days A FACE TO FACE EVALUATION WAS PERFORMED  Erick Colace 05/06/2023, 7:47 AM

## 2023-05-07 ENCOUNTER — Other Ambulatory Visit (HOSPITAL_COMMUNITY): Payer: Self-pay

## 2023-05-07 LAB — GLUCOSE, CAPILLARY: Glucose-Capillary: 132 mg/dL — ABNORMAL HIGH (ref 70–99)

## 2023-05-07 NOTE — Progress Notes (Signed)
PROGRESS NOTE   Subjective/Complaints:  Pt doing ok, ready to go! Slept poorly so looking forward to sleeping in his bed. No pain today. LBM 1/22 per documentation, advised to stay on top of his BMs at home. Urinating fine, denies any other complaints or concerns.   ROS: . Denies CP, SOB, abd pain, n/v/d  Objective:   No results found.  No results for input(s): "WBC", "HGB", "HCT", "PLT" in the last 72 hours.   No results for input(s): "NA", "K", "CL", "CO2", "GLUCOSE", "BUN", "CREATININE", "CALCIUM" in the last 72 hours.    Intake/Output Summary (Last 24 hours) at 05/07/2023 0958 Last data filed at 05/06/2023 1855 Gross per 24 hour  Intake 240 ml  Output --  Net 240 ml         Physical Exam: Vital Signs Blood pressure 125/69, pulse 78, temperature 98.1 F (36.7 C), resp. rate 18, height 6' (1.829 m), weight 101.9 kg, SpO2 95%. Physical Exam General: No acute distress, resting in bed.  Vital signs reviewed Mood and affect are appropriate Heart: Regular rate and rhythm no rubs murmurs or extra sounds Chest - s/p right masectomy  Lungs: Clear to auscultation, breathing unlabored, no rales or wheezes, on RA, minimal dry cough occasionally Abdomen: Positive bowel sounds, soft nontender to palpation, nondistended Extremities: No clubbing, cyanosis, or edema Skin: No evidence of breakdown, no evidence of rash over exposed surfaces  PRIOR EXAMS: Neurologic: Cranial nerves II through XII grossly intact, motor strength is 5/5 in bilateral deltoid, bicep, tricep, grip, hip flexor, knee extensors, ankle dorsiflexor and plantar flexor    Musculoskeletal: Full range of motion in all 4 extremities. No joint swelling     Assessment/Plan: 1. Functional deficits which require 3+ hours per day of interdisciplinary therapy in a comprehensive inpatient rehab setting. Physiatrist is providing close team supervision and 24 hour  management of active medical problems listed below. Physiatrist and rehab team continue to assess barriers to discharge/monitor patient progress toward functional and medical goals  Care Tool:  Bathing    Body parts bathed by patient: Right arm, Left arm, Chest, Abdomen, Front perineal area, Right upper leg, Left upper leg, Face, Buttocks, Left lower leg, Right lower leg   Body parts bathed by helper: Buttocks, Left lower leg, Right lower leg     Bathing assist Assist Level: Independent with assistive device Assistive Device Comment: Seated on TTB, grab bars   Upper Body Dressing/Undressing Upper body dressing   What is the patient wearing?: Pull over shirt    Upper body assist Assist Level: Independent with assistive device    Lower Body Dressing/Undressing Lower body dressing      What is the patient wearing?: Pants     Lower body assist Assist for lower body dressing: Independent with assitive device     Toileting Toileting Toileting Activity did not occur (Clothing management and hygiene only): N/A (no void or bm)  Toileting assist Assist for toileting: Independent with assistive device Assistive Device Comment: RW   Transfers Chair/bed transfer  Transfers assist     Chair/bed transfer assist level: Contact Guard/Touching assist Chair/bed transfer assistive device: Environmental consultant, Archivist  Ambulation assist      Assist level: Contact Guard/Touching assist Assistive device: Walker-rolling Max distance: 258ft   Walk 10 feet activity   Assist     Assist level: Supervision/Verbal cueing Assistive device: Walker-rolling   Walk 50 feet activity   Assist    Assist level: Supervision/Verbal cueing Assistive device: Walker-rolling    Walk 150 feet activity   Assist Walk 150 feet activity did not occur: Safety/medical concerns  Assist level: Contact Guard/Touching assist Assistive device: Walker-rolling    Walk 10 feet  on uneven surface  activity   Assist Walk 10 feet on uneven surfaces activity did not occur: Safety/medical concerns   Assist level: Contact Guard/Touching assist Assistive device: Walker-rolling   Wheelchair     Assist Is the patient using a wheelchair?: No Type of Wheelchair: Manual    Wheelchair assist level: Dependent - Patient 0%      Wheelchair 50 feet with 2 turns activity    Assist        Assist Level: Dependent - Patient 0%   Wheelchair 150 feet activity     Assist      Assist Level: Dependent - Patient 0%   Blood pressure 125/69, pulse 78, temperature 98.1 F (36.7 C), resp. rate 18, height 6' (1.829 m), weight 101.9 kg, SpO2 95%.  Medical Problem List and Plan: 1. 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  2.  Antithrombotics: -DVT/anticoagulation:  Mechanical: Antiembolism stockings, thigh (TED hose) Bilateral lower extremities -antiplatelet therapy: Aspirin 81 mg daily and Plavix 75 mg daily x 3 weeks then Plavix alone 3. Pain Management: Tylenol as needed 4. Mood/Behavior/Sleep: Not emotional support             -antipsychotic agents: N/A ? Post CVA depression, trial buproprion given hx of HA with sertraline   5. Neuropsych/cognition: This patient is capable of making decisions on his own behalf. 6. Skin/Wound Care: Routine skin checks 7. Fluids/Electrolytes/Nutrition: Routine in and outs with follow-up chemistries 8.  Hypertension.  Norvasc 5 mg daily.  Monitor with increased mobility  -1/11 intermittently  elevated continue to monitor -04/24/23 BPs fine, cont regimen; of note, pharmacy stated he was on furosemide 80mg  daily but down to 40mg  daily on inpatient, but not resumed here  Increase amlodipine to 10mg  on 1/14 monitor  - overall improved ,mildly elevated systolic this am  -05/04/23 BP stable continue current regimen Orthostatic hypotension reduce amlodipine, will need to let BP run higher to compensate for orthostasis  -05/07/23 BPs fine, cont regimen outpatient Vitals:   05/03/23 1417 05/03/23 2206 05/04/23 0626 05/04/23 1302  BP: 137/67 126/60 135/64 125/63   05/04/23 1951 05/05/23 0306 05/05/23 1423 05/05/23 1958  BP: (!) 141/69 (!) 148/63 138/65 135/68   05/06/23 1259 05/06/23 1939 05/07/23 0548 05/07/23 0817  BP: (!) 126/54 136/63 (!) 142/73 125/69     9.  Diabetes mellitus peripheral neuropathy.  Latest  hemoglobin A1c 12.3.  Currently on NovoLog 70/30 22 units twice daily.  Diabetic teaching.  Patient's home regimen of Glucophage 1000 mg twice daily as well as Toujeo 15 units in the morning and 70 units in the evening-- currently held.  Resume as needed 1/11 CBGs mildly elevated, restart metformin XR at 500 mg dose, continue current dose of 70/30 for now -04/24/23 CBGs in the 200s, recent med adjustment, monitor for trend with that change, but suspect he'll likely need further adjustments -Increase novalog 70/30 to 22U BID first dose 1/13 pm , improved CBG this am , may be able to d/c metformin  -1/17 CBGs controlled continue current -04/30/23 CBGs fine, not eating much, SSI d/c'd for now; getting IVFs today; monitor for now, but suspect CBGs will be less d/t poor PO intake -05/06/23 CBGs controlled cont metformin and 70/30 insulin, may do this at home , has Endo consult in March  -05/07/23 CBGs controlled, cont regimen outpatient  CBG (last 3)  Recent Labs    05/06/23 1704 05/06/23 2119 05/07/23 0546  GLUCAP 168* 141* 132*   10.  Chronic vertigo.  Continue Antivert 12.5 mg 3  times daily prn.  Patient followed outpatient by ENT   11.  BPH.  Proscar 5 mg daily, Flomax 0.4 mg daily.  Check PVR.  Followed by urology Dr. Tobie Lords [facility]  -1/11 continent continue current regimen    12.  History of breast cancer.  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  Status post modified radical mastectomy and chemotherapy.  Patient on tamoxifen 20mg  daily since April 2020 follow-up with outpatient oncology Dr Allison Quarry Vallathucherry  Have resumed tamoxifen  13.  GERD.  Protonix 40mg  daily   14.  AKI on CKD.  Creatinine baseline 1.29-1.64.  Follow-up chemistries  -improving    Latest Ref Rng & Units 05/02/2023    4:56 AM 04/30/2023    5:52 AM 04/28/2023    5:08 PM  BMP  Glucose 70 - 99 mg/dL 811  914  782   BUN 8 - 23 mg/dL 13  11  13    Creatinine 0.61 - 1.24 mg/dL 9.56  2.13  0.86   Sodium 135 - 145 mmol/L 132  132  133   Potassium 3.5 - 5.1 mmol/L 4.7  4.3  4.2   Chloride 98 - 111 mmol/L 101  99  100   CO2 22 - 32 mmol/L 19  20  20    Calcium 8.9 - 10.3 mg/dL 9.0  9.1  9.1      15.  Obesity.  BMI 30.45.  Dietary follow-up  16. Constipation: on miralax BID- large cont liquid BM this am  -04/24/23 reports LBM was yesterday or two days ago, no documentation of BMs on file-- also refusing miralax; if no BM by tomorrow, would consider encouraging pt to use miralax, or using alternative bowel regimen that he'd be willing to use.  -05/01/23 LBM 2 days ago but poor PO intake last couple days, monitor for now -05/07/23 LBM 1/22 per documentation, advised outpatient meds to stay on top of BMs and avoid constipation  17. Cough/nausea/Metapneumovirus- on droplet precautions  -CXR ordered-no acute changes  -WBC within normal limits on CBC 1/16 and 1/18   -1/17 respiratory panel is pending- had low grade temp 1/17 and 1/18 now resolved   Encourage use of flutter valve -      18.  Mildly elevated LFTs  -Monitor Tylenol usage, recheck Monday-- improving, f/up  outpatient    LOS:  15 days A FACE TO FACE EVALUATION WAS PERFORMED  [address] 05/07/2023, 9:58 AM

## 2023-05-07 NOTE — Plan of Care (Signed)
Problem: Sit to Stand Goal: LTG:  Patient will perform sit to stand with assistance level (PT) Description: LTG:  Patient will perform sit to stand with assistance level (PT) Outcome: Adequate for Discharge Flowsheets (Taken 05/07/2023 0521) LTG: PT will perform sit to stand in preparation for functional mobility with assistance level: Supervision/Verbal cueing Note: Pt on track to meet all LTGs and then contracted respiratory infection which has slightly regressed pt d/t increased fatigue and generalized weakness. Wife has demonstrated ability to provide appropriate level of assist for pt's discharge home.    Problem: RH Ambulation Goal: LTG Patient will ambulate in controlled environment (PT) Description: LTG: Patient will ambulate in a controlled environment, # of feet with assistance (PT). Outcome: Adequate for Discharge Flowsheets Taken 05/06/2023 1721 by Loel Dubonnet, PT LTG: Pt will ambulate in controlled environ  assist needed:: Contact Guard/Touching assist Taken 04/24/2023 1244 by Ginny Forth, PT LTG: Ambulation distance in controlled environment: 120ft using LRAD Note: Pt on track to meet all LTGs and then contracted respiratory infection which has slightly regressed pt d/t increased fatigue and generalized weakness. Wife has demonstrated ability to provide appropriate level of assist for pt's discharge home.    Problem: RH Balance Goal: LTG Patient will maintain dynamic sitting balance (PT) Description: LTG:  Patient will maintain dynamic sitting balance with assistance during mobility activities (PT) Outcome: Completed/Met Flowsheets (Taken 05/06/2023 1721) LTG: Pt will maintain dynamic sitting balance during mobility activities with:: Independent Goal: LTG Patient will maintain dynamic standing balance (PT) Description: LTG:  Patient will maintain dynamic standing balance with assistance during mobility activities (PT) Outcome: Completed/Met Flowsheets (Taken 04/24/2023  1244 by Pippin, Carly M, PT) LTG: Pt will maintain dynamic standing balance during mobility activities with:: Supervision/Verbal cueing   Problem: RH Bed Mobility Goal: LTG Patient will perform bed mobility with assist (PT) Description: LTG: Patient will perform bed mobility with assistance, with/without cues (PT). Outcome: Completed/Met Flowsheets (Taken 04/24/2023 1244 by Casimiro Needle M, PT) LTG: Pt will perform bed mobility with assistance level of: Independent with assistive device    Problem: RH Bed to Chair Transfers Goal: LTG Patient will perform bed/chair transfers w/assist (PT) Description: LTG: Patient will perform bed to chair transfers with assistance (PT). Outcome: Completed/Met Flowsheets (Taken 05/06/2023 1721) LTG: Pt will perform Bed to Chair Transfers with assistance level: Supervision/Verbal cueing Note: Pt on track to meet all LTGs and then contracted respiratory infection which has slightly regressed pt d/t increased fatigue and generalized weakness. Wife has demonstrated ability to provide appropriate level of assist for pt's discharge home.    Problem: RH Car Transfers Goal: LTG Patient will perform car transfers with assist (PT) Description: LTG: Patient will perform car transfers with assistance (PT). Outcome: Completed/Met Flowsheets (Taken 04/24/2023 1244 by Ginny Forth, PT) LTG: Pt will perform car transfers with assist:: Supervision/Verbal cueing   Problem: RH Ambulation Goal: LTG Patient will ambulate in home environment (PT) Description: LTG: Patient will ambulate in home environment, # of feet with assistance (PT). Outcome: Completed/Met Flowsheets (Taken 04/24/2023 1244 by Ginny Forth, PT) LTG: Pt will ambulate in home environ  assist needed:: Supervision/Verbal cueing LTG: Ambulation distance in home environment: 52ft using LRAD   Problem: RH Stairs Goal: LTG Patient will ambulate up and down stairs w/assist (PT) Description: LTG: Patient will  ambulate up and down # of stairs with assistance (PT) Outcome: Completed/Met Flowsheets (Taken 05/06/2023 1721) LTG: Pt will ambulate up/down stairs assist needed:: Contact Guard/Touching assist Note: Pt on  track to meet all LTGs and then contracted respiratory infection which has slightly regressed pt d/t increased fatigue and generalized weakness. Wife has demonstrated ability to provide appropriate level of assist for pt's discharge home and he has demonstrated ability to perform at least 3 steps using BHRs with CGA for 3 steps to enter home and pt will live on main floor for now.

## 2023-05-07 NOTE — Plan of Care (Signed)
Problem: Education: Goal: Ability to describe self-care measures that may prevent or decrease complications (Diabetes Survival Skills Education) will improve Outcome: Completed/Met Goal: Individualized Educational Video(s) Outcome: Completed/Met   Problem: Coping: Goal: Ability to adjust to condition or change in health will improve Outcome: Completed/Met   Problem: Fluid Volume: Goal: Ability to maintain a balanced intake and output will improve Outcome: Completed/Met   Problem: Health Behavior/Discharge Planning: Goal: Ability to identify and utilize available resources and services will improve Outcome: Completed/Met Goal: Ability to manage health-related needs will improve Outcome: Completed/Met   Problem: Metabolic: Goal: Ability to maintain appropriate glucose levels will improve Outcome: Completed/Met   Problem: Nutritional: Goal: Maintenance of adequate nutrition will improve Outcome: Completed/Met Goal: Progress toward achieving an optimal weight will improve Outcome: Completed/Met   Problem: Skin Integrity: Goal: Risk for impaired skin integrity will decrease Outcome: Completed/Met   Problem: Tissue Perfusion: Goal: Adequacy of tissue perfusion will improve Outcome: Completed/Met

## 2023-05-08 ENCOUNTER — Inpatient Hospital Stay (HOSPITAL_COMMUNITY)
Admission: EM | Admit: 2023-05-08 | Discharge: 2023-05-14 | DRG: 242 | Disposition: A | Discharge status: Skilled Nursing Facility | Payer: Medicare Other | Attending: Cardiology | Admitting: Cardiology

## 2023-05-08 ENCOUNTER — Observation Stay (HOSPITAL_COMMUNITY): Payer: Medicare Other

## 2023-05-08 ENCOUNTER — Emergency Department (HOSPITAL_COMMUNITY): Payer: Medicare Other

## 2023-05-08 ENCOUNTER — Encounter (HOSPITAL_COMMUNITY): Payer: Self-pay | Admitting: Internal Medicine

## 2023-05-08 DIAGNOSIS — I82422 Acute embolism and thrombosis of left iliac vein: Secondary | ICD-10-CM | POA: Diagnosis not present

## 2023-05-08 DIAGNOSIS — Z452 Encounter for adjustment and management of vascular access device: Secondary | ICD-10-CM | POA: Diagnosis not present

## 2023-05-08 DIAGNOSIS — I442 Atrioventricular block, complete: Principal | ICD-10-CM

## 2023-05-08 DIAGNOSIS — Y92009 Unspecified place in unspecified non-institutional (private) residence as the place of occurrence of the external cause: Secondary | ICD-10-CM | POA: Diagnosis not present

## 2023-05-08 DIAGNOSIS — R918 Other nonspecific abnormal finding of lung field: Secondary | ICD-10-CM | POA: Diagnosis not present

## 2023-05-08 DIAGNOSIS — J90 Pleural effusion, not elsewhere classified: Secondary | ICD-10-CM | POA: Diagnosis not present

## 2023-05-08 DIAGNOSIS — I2510 Atherosclerotic heart disease of native coronary artery without angina pectoris: Secondary | ICD-10-CM | POA: Diagnosis present

## 2023-05-08 DIAGNOSIS — Z7902 Long term (current) use of antithrombotics/antiplatelets: Secondary | ICD-10-CM | POA: Diagnosis not present

## 2023-05-08 DIAGNOSIS — I2699 Other pulmonary embolism without acute cor pulmonale: Secondary | ICD-10-CM | POA: Diagnosis not present

## 2023-05-08 DIAGNOSIS — Z7901 Long term (current) use of anticoagulants: Secondary | ICD-10-CM

## 2023-05-08 DIAGNOSIS — E669 Obesity, unspecified: Secondary | ICD-10-CM | POA: Diagnosis not present

## 2023-05-08 DIAGNOSIS — E7800 Pure hypercholesterolemia, unspecified: Secondary | ICD-10-CM | POA: Diagnosis present

## 2023-05-08 DIAGNOSIS — R55 Syncope and collapse: Secondary | ICD-10-CM | POA: Diagnosis present

## 2023-05-08 DIAGNOSIS — K219 Gastro-esophageal reflux disease without esophagitis: Secondary | ICD-10-CM | POA: Diagnosis present

## 2023-05-08 DIAGNOSIS — R41841 Cognitive communication deficit: Secondary | ICD-10-CM | POA: Diagnosis not present

## 2023-05-08 DIAGNOSIS — Z7984 Long term (current) use of oral hypoglycemic drugs: Secondary | ICD-10-CM

## 2023-05-08 DIAGNOSIS — G629 Polyneuropathy, unspecified: Secondary | ICD-10-CM | POA: Diagnosis not present

## 2023-05-08 DIAGNOSIS — D649 Anemia, unspecified: Secondary | ICD-10-CM | POA: Diagnosis not present

## 2023-05-08 DIAGNOSIS — E1140 Type 2 diabetes mellitus with diabetic neuropathy, unspecified: Secondary | ICD-10-CM | POA: Diagnosis present

## 2023-05-08 DIAGNOSIS — K760 Fatty (change of) liver, not elsewhere classified: Secondary | ICD-10-CM | POA: Diagnosis not present

## 2023-05-08 DIAGNOSIS — R262 Difficulty in walking, not elsewhere classified: Secondary | ICD-10-CM | POA: Diagnosis not present

## 2023-05-08 DIAGNOSIS — Z1152 Encounter for screening for COVID-19: Secondary | ICD-10-CM

## 2023-05-08 DIAGNOSIS — Z950 Presence of cardiac pacemaker: Secondary | ICD-10-CM | POA: Diagnosis not present

## 2023-05-08 DIAGNOSIS — G319 Degenerative disease of nervous system, unspecified: Secondary | ICD-10-CM | POA: Diagnosis not present

## 2023-05-08 DIAGNOSIS — E119 Type 2 diabetes mellitus without complications: Secondary | ICD-10-CM

## 2023-05-08 DIAGNOSIS — E1165 Type 2 diabetes mellitus with hyperglycemia: Secondary | ICD-10-CM | POA: Diagnosis not present

## 2023-05-08 DIAGNOSIS — I10 Essential (primary) hypertension: Secondary | ICD-10-CM | POA: Diagnosis not present

## 2023-05-08 DIAGNOSIS — N179 Acute kidney failure, unspecified: Secondary | ICD-10-CM | POA: Diagnosis present

## 2023-05-08 DIAGNOSIS — I951 Orthostatic hypotension: Secondary | ICD-10-CM | POA: Diagnosis not present

## 2023-05-08 DIAGNOSIS — N400 Enlarged prostate without lower urinary tract symptoms: Secondary | ICD-10-CM | POA: Diagnosis present

## 2023-05-08 DIAGNOSIS — R5381 Other malaise: Secondary | ICD-10-CM | POA: Diagnosis present

## 2023-05-08 DIAGNOSIS — Y92002 Bathroom of unspecified non-institutional (private) residence single-family (private) house as the place of occurrence of the external cause: Secondary | ICD-10-CM | POA: Diagnosis not present

## 2023-05-08 DIAGNOSIS — G4700 Insomnia, unspecified: Secondary | ICD-10-CM | POA: Diagnosis not present

## 2023-05-08 DIAGNOSIS — L89151 Pressure ulcer of sacral region, stage 1: Secondary | ICD-10-CM | POA: Diagnosis not present

## 2023-05-08 DIAGNOSIS — Z9221 Personal history of antineoplastic chemotherapy: Secondary | ICD-10-CM | POA: Diagnosis not present

## 2023-05-08 DIAGNOSIS — E43 Unspecified severe protein-calorie malnutrition: Secondary | ICD-10-CM | POA: Insufficient documentation

## 2023-05-08 DIAGNOSIS — Z823 Family history of stroke: Secondary | ICD-10-CM

## 2023-05-08 DIAGNOSIS — W19XXXA Unspecified fall, initial encounter: Principal | ICD-10-CM

## 2023-05-08 DIAGNOSIS — Z794 Long term (current) use of insulin: Secondary | ICD-10-CM | POA: Diagnosis not present

## 2023-05-08 DIAGNOSIS — M6281 Muscle weakness (generalized): Secondary | ICD-10-CM | POA: Diagnosis not present

## 2023-05-08 DIAGNOSIS — I959 Hypotension, unspecified: Secondary | ICD-10-CM | POA: Diagnosis not present

## 2023-05-08 DIAGNOSIS — Z833 Family history of diabetes mellitus: Secondary | ICD-10-CM

## 2023-05-08 DIAGNOSIS — E785 Hyperlipidemia, unspecified: Secondary | ICD-10-CM | POA: Diagnosis not present

## 2023-05-08 DIAGNOSIS — W182XXA Fall in (into) shower or empty bathtub, initial encounter: Secondary | ICD-10-CM | POA: Diagnosis present

## 2023-05-08 DIAGNOSIS — Z853 Personal history of malignant neoplasm of breast: Secondary | ICD-10-CM

## 2023-05-08 DIAGNOSIS — R42 Dizziness and giddiness: Secondary | ICD-10-CM

## 2023-05-08 DIAGNOSIS — F54 Psychological and behavioral factors associated with disorders or diseases classified elsewhere: Secondary | ICD-10-CM | POA: Diagnosis not present

## 2023-05-08 DIAGNOSIS — I2693 Single subsegmental pulmonary embolism without acute cor pulmonale: Secondary | ICD-10-CM | POA: Diagnosis not present

## 2023-05-08 DIAGNOSIS — I639 Cerebral infarction, unspecified: Secondary | ICD-10-CM | POA: Diagnosis not present

## 2023-05-08 DIAGNOSIS — I700 Atherosclerosis of aorta: Secondary | ICD-10-CM | POA: Diagnosis present

## 2023-05-08 DIAGNOSIS — I82412 Acute embolism and thrombosis of left femoral vein: Secondary | ICD-10-CM | POA: Diagnosis not present

## 2023-05-08 DIAGNOSIS — Z888 Allergy status to other drugs, medicaments and biological substances status: Secondary | ICD-10-CM

## 2023-05-08 DIAGNOSIS — M47812 Spondylosis without myelopathy or radiculopathy, cervical region: Secondary | ICD-10-CM | POA: Diagnosis not present

## 2023-05-08 DIAGNOSIS — J9811 Atelectasis: Secondary | ICD-10-CM | POA: Diagnosis not present

## 2023-05-08 DIAGNOSIS — S199XXA Unspecified injury of neck, initial encounter: Secondary | ICD-10-CM | POA: Diagnosis not present

## 2023-05-08 DIAGNOSIS — N189 Chronic kidney disease, unspecified: Secondary | ICD-10-CM | POA: Diagnosis not present

## 2023-05-08 DIAGNOSIS — F325 Major depressive disorder, single episode, in full remission: Secondary | ICD-10-CM | POA: Diagnosis not present

## 2023-05-08 DIAGNOSIS — Z79899 Other long term (current) drug therapy: Secondary | ICD-10-CM

## 2023-05-08 DIAGNOSIS — R2681 Unsteadiness on feet: Secondary | ICD-10-CM | POA: Diagnosis not present

## 2023-05-08 DIAGNOSIS — R531 Weakness: Secondary | ICD-10-CM | POA: Diagnosis not present

## 2023-05-08 DIAGNOSIS — I82461 Acute embolism and thrombosis of right calf muscular vein: Secondary | ICD-10-CM | POA: Diagnosis not present

## 2023-05-08 DIAGNOSIS — Z91048 Other nonmedicinal substance allergy status: Secondary | ICD-10-CM

## 2023-05-08 DIAGNOSIS — Z807 Family history of other malignant neoplasms of lymphoid, hematopoietic and related tissues: Secondary | ICD-10-CM

## 2023-05-08 DIAGNOSIS — Z8673 Personal history of transient ischemic attack (TIA), and cerebral infarction without residual deficits: Secondary | ICD-10-CM

## 2023-05-08 DIAGNOSIS — Z79810 Long term (current) use of selective estrogen receptor modulators (SERMs): Secondary | ICD-10-CM

## 2023-05-08 DIAGNOSIS — R7989 Other specified abnormal findings of blood chemistry: Secondary | ICD-10-CM | POA: Diagnosis present

## 2023-05-08 DIAGNOSIS — Z8041 Family history of malignant neoplasm of ovary: Secondary | ICD-10-CM

## 2023-05-08 DIAGNOSIS — R231 Pallor: Secondary | ICD-10-CM | POA: Diagnosis not present

## 2023-05-08 DIAGNOSIS — R001 Bradycardia, unspecified: Secondary | ICD-10-CM | POA: Diagnosis not present

## 2023-05-08 DIAGNOSIS — Z743 Need for continuous supervision: Secondary | ICD-10-CM | POA: Diagnosis not present

## 2023-05-08 DIAGNOSIS — R0989 Other specified symptoms and signs involving the circulatory and respiratory systems: Secondary | ICD-10-CM | POA: Diagnosis not present

## 2023-05-08 DIAGNOSIS — Z6830 Body mass index (BMI) 30.0-30.9, adult: Secondary | ICD-10-CM

## 2023-05-08 DIAGNOSIS — Z9011 Acquired absence of right breast and nipple: Secondary | ICD-10-CM

## 2023-05-08 DIAGNOSIS — M7989 Other specified soft tissue disorders: Secondary | ICD-10-CM | POA: Diagnosis not present

## 2023-05-08 LAB — CBC WITH DIFFERENTIAL/PLATELET
Abs Immature Granulocytes: 0.13 10*3/uL — ABNORMAL HIGH (ref 0.00–0.07)
Basophils Absolute: 0.1 10*3/uL (ref 0.0–0.1)
Basophils Relative: 1 %
Eosinophils Absolute: 0.1 10*3/uL (ref 0.0–0.5)
Eosinophils Relative: 1 %
HCT: 43.5 % (ref 39.0–52.0)
Hemoglobin: 13.7 g/dL (ref 13.0–17.0)
Immature Granulocytes: 1 %
Lymphocytes Relative: 10 %
Lymphs Abs: 1 10*3/uL (ref 0.7–4.0)
MCH: 27 pg (ref 26.0–34.0)
MCHC: 31.5 g/dL (ref 30.0–36.0)
MCV: 85.6 fL (ref 80.0–100.0)
Monocytes Absolute: 0.7 10*3/uL (ref 0.1–1.0)
Monocytes Relative: 6 %
Neutro Abs: 8.5 10*3/uL — ABNORMAL HIGH (ref 1.7–7.7)
Neutrophils Relative %: 81 %
Platelets: 461 10*3/uL — ABNORMAL HIGH (ref 150–400)
RBC: 5.08 MIL/uL (ref 4.22–5.81)
RDW: 15.8 % — ABNORMAL HIGH (ref 11.5–15.5)
WBC: 10.4 10*3/uL (ref 4.0–10.5)
nRBC: 0 % (ref 0.0–0.2)

## 2023-05-08 LAB — COMPREHENSIVE METABOLIC PANEL
ALT: 47 U/L — ABNORMAL HIGH (ref 0–44)
AST: 49 U/L — ABNORMAL HIGH (ref 15–41)
Albumin: 3.6 g/dL (ref 3.5–5.0)
Alkaline Phosphatase: 59 U/L (ref 38–126)
Anion gap: 16 — ABNORMAL HIGH (ref 5–15)
BUN: 13 mg/dL (ref 8–23)
CO2: 22 mmol/L (ref 22–32)
Calcium: 9.9 mg/dL (ref 8.9–10.3)
Chloride: 100 mmol/L (ref 98–111)
Creatinine, Ser: 1.32 mg/dL — ABNORMAL HIGH (ref 0.61–1.24)
GFR, Estimated: 56 mL/min — ABNORMAL LOW (ref >60)
Glucose, Bld: 123 mg/dL — ABNORMAL HIGH (ref 70–99)
Potassium: 4.6 mmol/L (ref 3.5–5.1)
Sodium: 138 mmol/L (ref 135–145)
Total Bilirubin: 0.9 mg/dL (ref 0.0–1.2)
Total Protein: 7.4 g/dL (ref 6.5–8.1)

## 2023-05-08 LAB — URINALYSIS, ROUTINE W REFLEX MICROSCOPIC
Bilirubin Urine: NEGATIVE
Glucose, UA: NEGATIVE mg/dL
Hgb urine dipstick: NEGATIVE
Ketones, ur: NEGATIVE mg/dL
Leukocytes,Ua: NEGATIVE
Nitrite: NEGATIVE
Protein, ur: NEGATIVE mg/dL
Specific Gravity, Urine: 1.008 (ref 1.005–1.030)
pH: 5 (ref 5.0–8.0)

## 2023-05-08 LAB — RETICULOCYTES
Immature Retic Fract: 24.8 % — ABNORMAL HIGH (ref 2.3–15.9)
RBC.: 4.41 MIL/uL (ref 4.22–5.81)
Retic Count, Absolute: 70.6 10*3/uL (ref 19.0–186.0)
Retic Ct Pct: 1.6 % (ref 0.4–3.1)

## 2023-05-08 LAB — RESP PANEL BY RT-PCR (RSV, FLU A&B, COVID)  RVPGX2
Influenza A by PCR: NEGATIVE
Influenza B by PCR: NEGATIVE
Resp Syncytial Virus by PCR: NEGATIVE
SARS Coronavirus 2 by RT PCR: NEGATIVE

## 2023-05-08 LAB — TROPONIN I (HIGH SENSITIVITY): Troponin I (High Sensitivity): 9 ng/L (ref <18)

## 2023-05-08 LAB — D-DIMER, QUANTITATIVE: D-Dimer, Quant: 1.63 ug{FEU}/mL — ABNORMAL HIGH (ref 0.00–0.50)

## 2023-05-08 MED ORDER — SODIUM CHLORIDE 0.9% FLUSH
3.0000 mL | Freq: Two times a day (BID) | INTRAVENOUS | Status: DC
  Administered 2023-05-08 – 2023-05-14 (×12): 3 mL via INTRAVENOUS

## 2023-05-08 MED ORDER — ACETAMINOPHEN 325 MG PO TABS
650.0000 mg | ORAL_TABLET | Freq: Four times a day (QID) | ORAL | Status: DC | PRN
Start: 2023-05-08 — End: 2023-05-14
  Administered 2023-05-13: 650 mg via ORAL
  Filled 2023-05-08: qty 2

## 2023-05-08 MED ORDER — MORPHINE SULFATE (PF) 2 MG/ML IV SOLN: 2.0000 mg | Freq: Four times a day (QID) | INTRAVENOUS | Status: DC | PRN

## 2023-05-08 MED ORDER — PANTOPRAZOLE SODIUM 40 MG IV SOLR
40.0000 mg | Freq: Two times a day (BID) | INTRAVENOUS | Status: DC
  Administered 2023-05-08: 40 mg via INTRAVENOUS
  Filled 2023-05-08 (×2): qty 10

## 2023-05-08 MED ORDER — BUPROPION HCL 75 MG PO TABS
75.0000 mg | ORAL_TABLET | Freq: Two times a day (BID) | ORAL | Status: DC
  Administered 2023-05-09 – 2023-05-14 (×11): 75 mg via ORAL
  Filled 2023-05-08 (×13): qty 1

## 2023-05-08 MED ORDER — SODIUM CHLORIDE 0.9 % IV SOLN: Freq: Once | INTRAVENOUS | Status: AC

## 2023-05-08 MED ORDER — SODIUM CHLORIDE 0.9 % IV BOLUS
1000.0000 mL | Freq: Once | INTRAVENOUS | Status: AC
  Administered 2023-05-08: 1000 mL via INTRAVENOUS

## 2023-05-08 MED ORDER — SODIUM CHLORIDE 0.9 % IV SOLN: INTRAVENOUS | Status: AC

## 2023-05-08 MED ORDER — ACETAMINOPHEN 650 MG RE SUPP: 650.0000 mg | Freq: Four times a day (QID) | RECTAL | Status: DC | PRN

## 2023-05-08 NOTE — ED Provider Notes (Signed)
[name] EMERGENCY DEPARTMENT AT [facility] Provider Note   CSN: [id number] Arrival date & time: 05/08/23  1256     History  No chief complaint on file.   John [name] is a 78 y.o. male.  HPI 78 year old male history of hypertension, diabetes, GERD, hyperlipidemia presenting for fall.  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  He was in rehab from January 10 to yesterday when he was discharged.  He has been having difficulties with some balance and strength in his legs and he feels he is very deconditioned.  This morning he was in the shower when he folic his legs gave out and he fell.  Sonogram for about 30 minutes.  He does not think he hit his head or loss conscious.  He has chronic issues with dizziness and felt somewhat dizzy at the time.  No palpitations or shortness of breath or chest pain.  He is asymptomatic currently.  He feels quite thirsty and he reports has not been eating or drinking very well at the rehab facility.  He does not feel dizzy currently.  He has no headache or vision changes or weakness or numbness.  States he is on anticoagulation.     Home Medications Prior to Admission medications   Medication Sig Start Date End Date Taking? Authorizing Provider  acetaminophen (TYLENOL) 650 MG CR tablet Take 650-1,300 mg by mouth every 8 (eight) hours as needed for pain.   Yes [provider]  amLODipine (NORVASC) 5 MG tablet Take 1 tablet (5 mg total) by mouth daily. 05/06/23  Yes Angiulli, Mcarthur Rossetti, PA-C  Ascorbic Acid (VITAMIN C) 1000 MG tablet Take 1,000 mg by mouth 3 (three) times a week.   Yes [provider]  aspirin EC 81 MG tablet Swallow whole daily until 05/11/2023 and stop. 05/03/23  Yes Angiulli, Mcarthur Rossetti, PA-C  atorvastatin (LIPITOR) 80 MG tablet Take 1 tablet (80 mg total) by mouth at bedtime. 05/04/23  Yes Angiulli, Mcarthur Rossetti, PA-C  buPROPion (WELLBUTRIN) 75 MG tablet Take 1 tablet (75 mg total) by  mouth 2 (two) times daily. 05/04/23  Yes Angiulli, Mcarthur Rossetti, PA-C  clopidogrel (PLAVIX) 75 MG tablet Take 1 tablet (75 mg total) by mouth daily. 05/04/23  Yes Angiulli, Mcarthur Rossetti, PA-C  finasteride (PROSCAR) 5 MG tablet Take 1 tablet (5 mg total) by mouth daily. 05/04/23 12/27/24 Yes Angiulli, Mcarthur Rossetti, PA-C  furosemide (LASIX) 80 MG tablet Take 1 tablet (80 mg total) by mouth daily. 05/04/23  Yes Angiulli, Mcarthur Rossetti, PA-C  guaiFENesin (ROBITUSSIN) 100 MG/5ML liquid Take 10 mLs by mouth every 6 (six) hours as needed for cough or to loosen phlegm. 05/04/23  Yes Angiulli, Mcarthur Rossetti, PA-C  insulin glargine, 2 Unit Dial, (TOUJEO MAX) 300 UNIT/ML Solostar Pen Inject 45 units before breakfast and supper. 05/04/23  Yes Angiulli, Mcarthur Rossetti, PA-C  meclizine (ANTIVERT) 12.5 MG tablet Take 1 tablet (12.5 mg total) by mouth 3 (three) times daily as needed for dizziness. 05/04/23  Yes Angiulli, Mcarthur Rossetti, PA-C  metFORMIN (GLUCOPHAGE-XR) 500 MG 24 hr tablet Take 2 tablets (1,000 mg total) by mouth 2 (two) times daily with a meal. 05/04/23  Yes Angiulli, Mcarthur Rossetti, PA-C  Multiple Vitamin (MULTIVITAMIN) tablet Take 1 tablet by mouth 2 (two) times a week.   Yes [provider]  pantoprazole (PROTONIX) 40 MG tablet Take 1 tablet (40 mg total) by mouth daily. (Take in place of omeprazole while taking clopidogrel.) 05/04/23  Yes Angiulli, Mcarthur Rossetti, PA-C  polyethylene glycol (MIRALAX / GLYCOLAX) 17 g packet Take 17 g by mouth 2 (two) times daily. 04/22/23  Yes Regalado, Belkys A, MD  potassium chloride SA (KLOR-CON M) 20 MEQ tablet Take 2 tablets (40 mEq total) by mouth daily. 05/04/23  Yes Angiulli, Mcarthur Rossetti, PA-C  tamoxifen (NOLVADEX) 20 MG tablet Take 20 mg by mouth daily.   Yes [provider]  tamsulosin (FLOMAX) 0.4 MG CAPS capsule Take 1 capsule (0.4 mg total) by mouth daily. 05/04/23 12/27/24 Yes Angiulli, Mcarthur Rossetti, PA-C  Triamcinolone Acetonide (TRIAMCINOLONE 0.1 % CREAM : EUCERIN) CREA Apply 1 Application topically  2 (two) times daily. 04/22/23  Yes Regalado, Belkys A, MD  Blood Glucose Monitoring Suppl (ACCU-CHEK GUIDE) w/Device KIT Use daily to check blood sugar.  DX E11.69 11/22/22   Sharlene Dory, DO  insulin lispro (HUMALOG) 200 UNIT/ML KwikPen Inject 25 Units into the skin daily at 6 (six) AM. 05/04/23   Angiulli, Mcarthur Rossetti, PA-C  Lancets Misc. (ACCU-CHEK SOFTCLIX LANCET DEV) KIT Use daily to check blood sugar.  DX E11.69    [provider]      Allergies    Ramipril, Other, Sertraline, and Adhesive [tape]    Review of Systems   Review of Systems Review of systems completed and notable as per HPI.  ROS otherwise negative.   Physical Exam Updated Vital Signs BP 138/70   Pulse 78   Temp 98.5 F (36.9 C) (Oral)   Resp 18   SpO2 95%  Physical Exam Vitals and nursing note reviewed.  Constitutional:      General: He is not in acute distress.    Appearance: He is well-developed.  HENT:     Head: Normocephalic and atraumatic.     Mouth/Throat:     Mouth: Mucous membranes are dry.     Pharynx: Oropharynx is clear.  Eyes:     Extraocular Movements: Extraocular movements intact.     Conjunctiva/sclera: Conjunctivae normal.     Pupils: Pupils are equal, round, and reactive to light.  Cardiovascular:     Rate and Rhythm: Normal rate and regular rhythm.     Pulses: Normal pulses.     Heart sounds: Normal heart sounds. No murmur heard. Pulmonary:     Effort: Pulmonary effort is normal. No respiratory distress.     Breath sounds: Normal breath sounds.  Abdominal:     Palpations: Abdomen is soft.     Tenderness: There is no abdominal tenderness.  Musculoskeletal:        General: No swelling.     Cervical back: Normal range of motion and neck supple. No rigidity or tenderness.     Right lower leg: No edema.     Left lower leg: No edema.  Skin:    General: Skin is warm and dry.     Capillary Refill: Capillary refill takes less than 2 seconds.  Neurological:     General:  No focal deficit present.     Mental Status: He is alert and oriented to person, place, and time. Mental status is at baseline.     Cranial Nerves: No cranial nerve deficit.     Sensory: No sensory deficit.     Motor: No weakness.     Coordination: Coordination normal.  Psychiatric:        Mood and Affect: Mood normal.     ED Results / Procedures / Treatments   Labs (all labs ordered are listed, but only  abnormal results are displayed) Labs Reviewed  COMPREHENSIVE METABOLIC PANEL - Abnormal; Notable for the following components:      Result Value   Glucose, Bld 123 (*)    Creatinine, Ser 1.32 (*)    AST 49 (*)    ALT 47 (*)    GFR, Estimated 56 (*)    Anion gap 16 (*)    All other components within normal limits  CBC WITH DIFFERENTIAL/PLATELET - Abnormal; Notable for the following components:   RDW 15.8 (*)    Platelets 461 (*)    Neutro Abs 8.5 (*)    Abs Immature Granulocytes 0.13 (*)    All other components within normal limits  D-DIMER, QUANTITATIVE - Abnormal; Notable for the following components:   D-Dimer, Quant 1.63 (*)    All other components within normal limits  RETICULOCYTES - Abnormal; Notable for the following components:   Immature Retic Fract 24.8 (*)    All other components within normal limits  RESP PANEL BY RT-PCR (RSV, FLU A&B, COVID)  [id number]  URINALYSIS, ROUTINE W REFLEX MICROSCOPIC  VITAMIN B12  VITAMIN D 25 HYDROXY (VIT D DEFICIENCY, FRACTURES)  OCCULT BLOOD X 1 CARD TO LAB, STOOL  OCCULT BLOOD X 1 CARD TO LAB, STOOL  COMPREHENSIVE METABOLIC PANEL  CBC  GAMMA GT  LACTIC ACID, PLASMA  LACTIC ACID, PLASMA  TROPONIN I (HIGH SENSITIVITY)    EKG None  Radiology CT Head Wo Contrast Result Date: 05/08/2023 CLINICAL DATA:  Head trauma, minor.  Weakness.  Fall. EXAM: CT HEAD WITHOUT CONTRAST TECHNIQUE: Contiguous axial images were obtained from the base of the skull through the vertex without intravenous contrast. RADIATION DOSE REDUCTION: This  exam was performed according to the departmental dose-optimization program which includes automated exposure control, adjustment of the mA and/or kV according to patient size and/or use of iterative reconstruction technique. COMPARISON:  CT head without contrast 04/20/2023 FINDINGS: Brain: Moderate atrophy and white matter disease is stable. Remote lacunar infarcts are again noted within the thalami bilaterally. A remote lacunar infarct is present in the inferior left caudate head. No acute cortical infarct or significant interval change is present. The ventricles are proportionate to the degree of atrophy. No significant extraaxial fluid collection is present. No acute hemorrhage or mass lesion is present. Midline structures are within normal limits. The brainstem and cerebellum are within normal limits. Vascular: Atherosclerotic calcifications are present within the cavernous internal carotid arteries bilaterally. No hyperdense vessel is present. Skull: Calvarium is intact. No focal lytic or blastic lesions are present. No significant extracranial soft tissue lesion is present. Sinuses/Orbits: Minimal fluid is present in the maxillary sinuses bilaterally. Scattered ethmoid opacification is present. The paranasal sinuses and mastoid air cells are otherwise clear. Bilateral lens replacements are noted. Globes and orbits are otherwise unremarkable. IMPRESSION: 1. No acute intracranial abnormality or significant interval change. 2. Stable atrophy and white matter disease likely reflects the sequela of chronic microvascular ischemia. 3. Remote lacunar infarcts of the thalami bilaterally and inferior left caudate head. 4. Minimal bilateral maxillary and ethmoid sinus disease. Electronically Signed   By: Marin Roberts M.D.   On: 05/08/2023 15:35   DG Chest 2 View Result Date: 05/08/2023 CLINICAL DATA:  Dizziness and fall EXAM: CHEST - 2 VIEW COMPARISON:  Chest radiograph dated 04/28/2023 FINDINGS: Patient is  rotated to the right. Low lung volumes with bronchovascular crowding. Right mid lung linear and patchy opacity. Left lower lung patchy opacity. Small right pleural effusion. No pneumothorax. The heart size  and mediastinal contours are within normal limits. No radiographic finding of acute displaced fracture. IMPRESSION: 1. Low lung volumes with bronchovascular crowding. Right mid lung linear and patchy opacity and left lower lung patchy opacity may reflect atelectasis, aspiration, or pneumonia. 2. Small right pleural effusion. 3.  No radiographic finding of acute displaced fracture. Electronically Signed   By: Agustin Cree M.D.   On: 05/08/2023 14:19    Procedures Procedures    Medications Ordered in ED Medications  buPROPion [facility]) tablet 75 mg (has no administration in time range)  pantoprazole (PROTONIX) injection 40 mg (40 mg Intravenous Given 05/08/23 2250)  sodium chloride flush (NS) 0.9 % injection 3 mL (3 mLs Intravenous Given 05/08/23 2249)  acetaminophen (TYLENOL) tablet 650 mg (has no administration in time range)    Or  acetaminophen (TYLENOL) suppository 650 mg (has no administration in time range)  morphine (PF) 2 MG/ML injection 2 mg (has no administration in time range)  0.9 %  sodium chloride infusion ( Intravenous New Bag/Given 05/08/23 2310)  sodium chloride 0.9 % bolus 1,000 mL (0 mLs Intravenous Stopped 05/08/23 1709)  0.9 %  sodium chloride infusion (0 mLs Intravenous Stopped 05/08/23 1834)    ED Course/ Medical Decision Making/ A&P Clinical Course as of 05/08/23 2335  Sun May 08, 2023  1615 EKG normal sinus rhythm, right bundle branch block without significant change from prior.  No signs of acute ischemia. [JD]    Clinical Course User Index [JD] Laurence Spates, MD                                 Medical Decision Making Amount and/or Complexity of Data Reviewed Labs: ordered. Radiology: ordered.  Risk Prescription drug management. Decision regarding  hospitalization.   Medical Decision Making:   John [name] is a 78 y.o. male who presented to the ED today with likely fall.  Vital signs reviewed.  He was reportedly hypotensive with EMS but is normotensive here.  On exam he appears dry and somewhat deconditioned but has no focal abnormality.  He does not think he hit his head, but given mechanism and report anticoagulation obtain CT head as well as chest x-ray.  No other signs of trauma.  Suspect he is dry, and deconditioned leading to his fall.  Obtain EKG given his recent dizziness.  No chest pain, low concern for ACS.   Patient placed on continuous vitals and telemetry monitoring while in ED which was reviewed periodically.  Reviewed and confirmed nursing documentation for past medical history, family history, social history.  Reassessment and Plan:   Labwork reviewed, creatinine slightly elevated from prior.  He has mild transaminitis but benign abdominal exam.  CT head, chest x-ray overall unremarkable.  No clinical signs of pneumonia.  Will try to ambulate him he was quite orthostatic.  I suspect he is dry related to poor p.o. intake and I think his deconditioning is not helping either.  He is not able to safely take care of himself at home, I discussed the patient with the hospitalist and he was admitted for further management.   Patient's presentation is most consistent with acute complicated illness / injury requiring diagnostic workup.           Final Clinical Impression(s) / ED Diagnoses Final diagnoses:  Fall, initial encounter    Rx / DC Orders ED Discharge Orders     None  Laurence Spates, MD 05/08/23 [phone]

## 2023-05-08 NOTE — H&P (Signed)
History and Physical    Patient: John [name] ZOX:[id number] DOB: 1945/06/03 DOA: 05/08/2023 DOS: the patient was seen and examined on 05/08/2023 PCP: Sharlene Dory, DO  Patient coming from: Home Chief complaint:   HPI:  John [name] is a 78 y.o. 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  Admission requested for fall, weakness, hypotension.  Per nurses report systolic of 93 noted. [place] RUQ 2021: No GB stones. Ct chest 2021: Extensive Cad .   >>ED Course: In emergency room alert awake oriented afebrile O2 sats within normal limits on room air. Vitals:   05/08/23 1326 05/08/23 1747 05/08/23 1748  BP: 125/67 136/67   Pulse: 77 80   Temp: 97.6 F (36.4 C)  98.6 F (37 C)  Resp: 16 16   SpO2: 95% 96%   TempSrc: Oral  Oral  Initial blood pressures were low with systolic noted to be 93 by nurse note.  ED evaluation  so far shows: Troponin, dimer requested.  EKG ordered.  Respiratory panel Metabolic panel shows AKI with a creatinine of 1.32 EGFR 56 AST 49 ALT 47 anion gap of 16. CBC within normal limits.  In the emergency room  pt has received the following treatment thus far: Medications  buPROPion [facility]) tablet 75 mg (has no administration in time range)  sodium chloride 0.9  % bolus 1,000 mL (0 mLs Intravenous Stopped 05/08/23 1709)  0.9 %  sodium chloride infusion ( Intravenous New Bag/Given 05/08/23 1735)   Review of Systems  Musculoskeletal:  Positive for falls.  Neurological:  Positive for dizziness.  All other systems reviewed and are negative.  Past Medical History:  Diagnosis Date   BPH (benign prostatic hyperplasia)    CTS (carpal tunnel syndrome)    Depression    Diabetes mellitus    Essential hypertension    GERD (gastroesophageal reflux disease)    History of breast cancer    Hypercholesteremia    Neuropathy    Past Surgical History:  Procedure Laterality Date   CATARACT EXTRACTION, BILATERAL  01/18/2017   LITHOTRIPSY     MASTECTOMY Right 05/02/2018   TONSILLECTOMY AND ADENOIDECTOMY      reports that he has never smoked. He has never used smokeless tobacco. He reports that he does not drink alcohol and does not use drugs.  Allergies  Allergen Reactions   Ramipril Anaphylaxis   Other Diarrhea    Severe intolerance to Chemotherapy in the past.   Sertraline Other (See Comments)    "Extreme headaches"   Adhesive [Tape] Rash    Family History  Problem Relation Age of Onset   Lymphoma Mother    Cancer Mother    Diabetes Father    Stroke Father    Ovarian cancer Sister    Cancer Sister    Cancer Paternal Grandmother     Prior to  Admission medications   Medication Sig Start Date End Date Taking? Authorizing Provider  acetaminophen (TYLENOL) 650 MG CR tablet Take 650-1,300 mg by mouth every 8 (eight) hours as needed for pain.    [provider]  amLODipine (NORVASC) 5 MG tablet Take 1 tablet (5 mg total) by mouth daily. 05/06/23   Angiulli, Mcarthur Rossetti, PA-C  Ascorbic Acid (VITAMIN C) 1000 MG tablet Take 1,000 mg by mouth 3 (three) times a week.    [provider]  aspirin EC 81 MG tablet Swallow whole daily until 05/11/2023 and stop. 05/03/23   Angiulli, Mcarthur Rossetti, PA-C  atorvastatin (LIPITOR) 80 MG tablet Take 1 tablet  (80 mg total) by mouth at bedtime. 05/04/23   Angiulli, Mcarthur Rossetti, PA-C  Blood Glucose Monitoring Suppl (ACCU-CHEK GUIDE) w/Device KIT Use daily to check blood sugar.  DX E11.69 11/22/22   Sharlene Dory, DO  buPROPion (WELLBUTRIN) 75 MG tablet Take 1 tablet (75 mg total) by mouth 2 (two) times daily. 05/04/23   Angiulli, Mcarthur Rossetti, PA-C  clopidogrel (PLAVIX) 75 MG tablet Take 1 tablet (75 mg total) by mouth daily. 05/04/23   Angiulli, Mcarthur Rossetti, PA-C  finasteride (PROSCAR) 5 MG tablet Take 1 tablet (5 mg total) by mouth daily. 05/04/23 12/27/24  Angiulli, Mcarthur Rossetti, PA-C  furosemide (LASIX) 80 MG tablet Take 1 tablet (80 mg total) by mouth daily. 05/04/23   Angiulli, Mcarthur Rossetti, PA-C  guaiFENesin (ROBITUSSIN) 100 MG/5ML liquid Take 10 mLs by mouth every 6 (six) hours as needed for cough or to loosen phlegm. 05/04/23   Angiulli, Mcarthur Rossetti, PA-C  insulin glargine, 2 Unit Dial, (TOUJEO MAX) 300 UNIT/ML Solostar Pen Inject 45 units before breakfast and supper. 05/04/23   Angiulli, Mcarthur Rossetti, PA-C  insulin lispro (HUMALOG) 200 UNIT/ML KwikPen Inject 25 Units into the skin daily at 6 (six) AM. 05/04/23   Angiulli, Mcarthur Rossetti, PA-C  Lancets Misc. (ACCU-CHEK SOFTCLIX LANCET DEV) KIT Use daily to check blood sugar.  DX E11.69    [provider]  meclizine (ANTIVERT) 12.5 MG tablet Take 1 tablet (12.5 mg total) by mouth 3 (three) times daily as needed for dizziness. 05/04/23   Angiulli, Mcarthur Rossetti, PA-C  metFORMIN (GLUCOPHAGE-XR) 500 MG 24 hr tablet Take 2 tablets (1,000 mg total) by mouth 2 (two) times daily with a meal. 05/04/23   Angiulli, Mcarthur Rossetti, PA-C  Multiple Vitamin (MULTIVITAMIN) tablet Take 1 tablet by mouth 2 (two) times a week.    [provider]  pantoprazole (PROTONIX) 40 MG tablet Take 1 tablet (40 mg total) by mouth daily. (Take in place of omeprazole while taking clopidogrel.) 05/04/23   Angiulli, Mcarthur Rossetti, PA-C  polyethylene glycol (MIRALAX / GLYCOLAX) 17 g packet Take 17 g by mouth 2 (two)  times daily. 04/22/23   Regalado, Belkys A, MD  potassium chloride SA (KLOR-CON M) 20 MEQ tablet Take 2 tablets (40 mEq total) by mouth daily. 05/04/23   Angiulli, Mcarthur Rossetti, PA-C  tamoxifen (NOLVADEX) 20 MG tablet Take 20 mg by mouth daily.    [provider]  tamsulosin (FLOMAX) 0.4 MG CAPS capsule Take 1 capsule (0.4 mg total) by mouth daily. 05/04/23 12/27/24  Angiulli, Mcarthur Rossetti, PA-C  Triamcinolone Acetonide (TRIAMCINOLONE 0.1 % CREAM : EUCERIN) CREA Apply 1 Application topically 2 (two) times daily. 04/22/23   Regalado, Jon Billings A, MD   Vitals:   05/08/23 1326 05/08/23 1747 05/08/23 1748  BP: 125/67 136/67   Pulse: 77 80   Resp: 16  16   Temp: 97.6 F (36.4 C)  98.6 F (37 C)  TempSrc: Oral  Oral  SpO2: 95% 96%    Physical Exam Vitals and nursing note reviewed.  Constitutional:      General: He is not in acute distress. HENT:     Head: Normocephalic and atraumatic.     Right Ear: Hearing normal.     Left Ear: Hearing normal.     Nose: Nose normal. No nasal deformity.     Mouth/Throat:     Lips: Pink.     Tongue: No lesions.     Pharynx: Oropharynx is clear.  Eyes:     General: Lids are normal.     Extraocular Movements: Extraocular movements intact.  Cardiovascular:     Rate and Rhythm: Normal rate and regular rhythm.     Heart sounds: Normal heart sounds.  Pulmonary:     Effort: Pulmonary effort is normal.     Breath sounds: Normal breath sounds.  Abdominal:     General: Bowel sounds are normal. There is no distension.     Palpations: Abdomen is soft. There is no mass.     Tenderness: There is no abdominal tenderness.  Musculoskeletal:     Right lower leg: No edema.     Left lower leg: No edema.  Skin:    General: Skin is warm.  Neurological:     General: No focal deficit present.     Mental Status: He is alert and oriented to person, place, and time.     Cranial Nerves: Cranial nerves 2-12 are intact.  Psychiatric:        Attention and Perception:  Attention normal.        Mood and Affect: Mood normal.        Speech: Speech normal.        Behavior: Behavior normal. Behavior is cooperative.     Labs on Admission: I have personally reviewed following labs and imaging studies Results for orders placed or performed during the hospital encounter of 05/08/23 (from the past 24 hours)  Comprehensive metabolic panel     Status: Abnormal   Collection Time: 05/08/23  2:05 PM  Result Value Ref Range   Sodium 138 135 - 145 mmol/L   Potassium 4.6 3.5 - 5.1 mmol/L   Chloride 100 98 - 111 mmol/L   CO2 22 22 - 32 mmol/L   Glucose, Bld 123 (H) 70 - 99 mg/dL   BUN 13 8 - 23 mg/dL   Creatinine, Ser 4.69 (H) 0.61 - 1.24 mg/dL   Calcium 9.9 8.9 - 62.9 mg/dL   Total Protein 7.4 6.5 - 8.1 g/dL   Albumin 3.6 3.5 - 5.0 g/dL   AST 49 (H) 15 - 41 U/L   ALT 47 (H) 0 - 44 U/L   Alkaline Phosphatase 59 38 - 126 U/L   Total Bilirubin 0.9 0.0 - 1.2 mg/dL   GFR, Estimated 56 (L) >60 mL/min   Anion gap 16 (H) 5 - 15  CBC with Differential     Status: Abnormal   Collection Time: 05/08/23  2:05 PM  Result Value Ref Range   WBC 10.4 4.0 - 10.5 K/uL   RBC 5.08 4.22 - 5.81 MIL/uL   Hemoglobin 13.7 13.0 - 17.0 g/dL   HCT 52.8 41.3 - 24.4 %   MCV 85.6 80.0 - 100.0 fL   MCH 27.0 26.0 - 34.0 pg   MCHC 31.5 30.0 - 36.0 g/dL   RDW 01.0 (  H) 11.5 - 15.5 %   Platelets 461 (H) 150 - 400 K/uL   nRBC 0.0 0.0 - 0.2 %   Neutrophils Relative % 81 %   Neutro Abs 8.5 (H) 1.7 - 7.7 K/uL   Lymphocytes Relative 10 %   Lymphs Abs 1.0 0.7 - 4.0 K/uL   Monocytes Relative 6 %   Monocytes Absolute 0.7 0.1 - 1.0 K/uL   Eosinophils Relative 1 %   Eosinophils Absolute 0.1 0.0 - 0.5 K/uL   Basophils Relative 1 %   Basophils Absolute 0.1 0.0 - 0.1 K/uL   Immature Granulocytes 1 %   Abs Immature Granulocytes 0.13 (H) 0.00 - 0.07 K/uL  Resp panel by RT-PCR (RSV, Flu A&B, Covid)     Status: None   Collection Time: 05/08/23  6:35 PM   Specimen: Nasal Swab  Result Value Ref Range    SARS Coronavirus 2 by RT PCR NEGATIVE NEGATIVE   Influenza A by PCR NEGATIVE NEGATIVE   Influenza B by PCR NEGATIVE NEGATIVE   Resp Syncytial Virus by PCR NEGATIVE NEGATIVE  D-dimer, quantitative     Status: Abnormal   Collection Time: 05/08/23  6:35 PM  Result Value Ref Range   D-Dimer, Quant 1.63 (H) 0.00 - 0.50 ug/mL-FEU  Troponin I (High Sensitivity)     Status: None   Collection Time: 05/08/23  6:35 PM  Result Value Ref Range   Troponin I (High Sensitivity) 9 <18 ng/L   Recent Results (from the past 720 hours)  SARS Coronavirus 2 by RT PCR (hospital order, performed in [facility] hospital lab) *cepheid single result test* Anterior Nasal Swab     Status: None   Collection Time: 04/29/23  4:26 PM   Specimen: Anterior Nasal Swab  Result Value Ref Range Status   SARS Coronavirus 2 by RT PCR NEGATIVE NEGATIVE Final    Comment: Performed at [facility], [address] [place], [place] [address]  Respiratory (~20 pathogens) panel by PCR     Status: Abnormal   Collection Time: 04/29/23  4:40 PM   Specimen: Nasopharyngeal Swab; Respiratory  Result Value Ref Range Status   Adenovirus NOT DETECTED NOT DETECTED Final   Coronavirus 229E NOT DETECTED NOT DETECTED Final    Comment: (NOTE) The Coronavirus on the Respiratory Panel, DOES NOT test for the novel  Coronavirus (2019 nCoV)    Coronavirus HKU1 NOT DETECTED NOT DETECTED Final   Coronavirus NL63 NOT DETECTED NOT DETECTED Final   Coronavirus OC43 NOT DETECTED NOT DETECTED Final   Metapneumovirus DETECTED (A) NOT DETECTED Final   Rhinovirus / Enterovirus NOT DETECTED NOT DETECTED Final   Influenza A NOT DETECTED NOT DETECTED Final   Influenza B NOT DETECTED NOT DETECTED Final   Parainfluenza Virus 1 NOT DETECTED NOT DETECTED Final   Parainfluenza Virus 2 NOT DETECTED NOT DETECTED Final   Parainfluenza Virus 3 NOT DETECTED NOT DETECTED Final   Parainfluenza Virus 4 NOT DETECTED NOT DETECTED Final   Respiratory Syncytial  Virus NOT DETECTED NOT DETECTED Final   Bordetella pertussis NOT DETECTED NOT DETECTED Final   Bordetella Parapertussis NOT DETECTED NOT DETECTED Final   Chlamydophila pneumoniae NOT DETECTED NOT DETECTED Final   Mycoplasma pneumoniae NOT DETECTED NOT DETECTED Final    Comment: Performed at [facility], [address], [place], [place] [address]  Resp panel by RT-PCR (RSV, Flu A&B, Covid)     Status: None   Collection Time: 05/08/23  6:35 PM   Specimen:  Nasal Swab  Result Value Ref Range Status   SARS Coronavirus 2 by RT PCR NEGATIVE NEGATIVE Final    Comment: (NOTE) SARS-CoV-2 target nucleic acids are NOT DETECTED.  The SARS-CoV-2 RNA is generally detectable in upper respiratory specimens during the acute phase of infection. The lowest concentration of SARS-CoV-2 viral copies this assay can detect is 138 copies/mL. A negative result does not preclude SARS-Cov-2 infection and should not be used as the sole basis for treatment or other patient management decisions. A negative result may occur with  improper specimen collection/handling, submission of specimen other than nasopharyngeal swab, presence of viral mutation(s) within the areas targeted by this assay, and inadequate number of viral copies(<138 copies/mL). A negative result must be combined with clinical observations, patient history, and epidemiological information. The expected result is Negative.  Fact Sheet for Patients:  BloggerCourse.com  Fact Sheet for Healthcare Providers:  SeriousBroker.it  This test is no t yet approved or cleared by the [place] FDA and  has been authorized for detection and/or diagnosis of SARS-CoV-2 by FDA under an Emergency Use Authorization (EUA). This EUA will remain  in effect (meaning this test can be used) for the duration of the COVID-19 declaration under Section 564(b)(1) of the Act, 21 U.S.C.section 360bbb-3(b)(1), unless  the authorization is terminated  or revoked sooner.       Influenza A by PCR NEGATIVE NEGATIVE Final   Influenza B by PCR NEGATIVE NEGATIVE Final    Comment: (NOTE) The Xpert Xpress SARS-CoV-2/FLU/RSV plus assay is intended as an aid in the diagnosis of influenza from Nasopharyngeal swab specimens and should not be used as a sole basis for treatment. Nasal washings and aspirates are unacceptable for Xpert Xpress SARS-CoV-2/FLU/RSV testing.  Fact Sheet for Patients: BloggerCourse.com  Fact Sheet for Healthcare Providers: SeriousBroker.it  This test is not yet approved or cleared by the [place] FDA and has been authorized for detection and/or diagnosis of SARS-CoV-2 by FDA under an Emergency Use Authorization (EUA). This EUA will remain in effect (meaning this test can be used) for the duration of the COVID-19 declaration under Section 564(b)(1) of the Act, 21 U.S.C. section 360bbb-3(b)(1), unless the authorization is terminated or revoked.     Resp Syncytial Virus by PCR NEGATIVE NEGATIVE Final    Comment: (NOTE) Fact Sheet for Patients: BloggerCourse.com  Fact Sheet for Healthcare Providers: SeriousBroker.it  This test is not yet approved or cleared by the [place] FDA and has been authorized for detection and/or diagnosis of SARS-CoV-2 by FDA under an Emergency Use Authorization (EUA). This EUA will remain in effect (meaning this test can be used) for the duration of the COVID-19 declaration under Section 564(b)(1) of the Act, 21 U.S.C. section 360bbb-3(b)(1), unless the authorization is terminated or revoked.  Performed at [facility], [address] [place], [place] [address]    CBC:    Latest Ref Rng & Units 05/08/2023    2:05 PM 05/02/2023    4:56 AM 04/30/2023    5:52 AM  CBC  WBC 4.0 - 10.5 K/uL 10.4  7.0  8.6   Hemoglobin 13.0  - 17.0 g/dL 19.1  47.8  29.5   Hematocrit 39.0 - 52.0 % 43.5  37.9  38.0   Platelets 150 - 400 K/uL 461  296  265    Basic Metabolic Panel: Recent Labs  Lab 05/02/23 0456 05/08/23 1405  NA 132* 138  K 4.7 4.6  CL 101 100  CO2 19* 22  GLUCOSE 115* 123*  BUN 13 13  CREATININE 1.00 1.32*  CALCIUM 9.0 9.9   Creatinine: Lab Results  Component Value Date   CREATININE 1.32 (H) 05/08/2023   CREATININE 1.00 05/02/2023   CREATININE 1.11 04/30/2023   Liver Function Tests:    Latest Ref Rng & Units 05/08/2023    2:05 PM 05/02/2023    4:56 AM 04/28/2023    5:08 PM  Hepatic Function  Total Protein 6.5 - 8.1 g/dL 7.4  5.5  5.9   Albumin 3.5 - 5.0 g/dL 3.6  2.5  2.9   AST 15 - 41 U/L 49  48  53   ALT 0 - 44 U/L 47  36  58   Alk Phosphatase 38 - 126 U/L 59  41  51   Total Bilirubin 0.0 - 1.2 mg/dL 0.9  1.7  1.1    Radiological Exams on Admission: CT Head Wo Contrast Result Date: 05/08/2023 CLINICAL DATA:  Head trauma, minor.  Weakness.  Fall. EXAM: CT HEAD WITHOUT CONTRAST TECHNIQUE: Contiguous axial images were obtained from the base of the skull through the vertex without intravenous contrast. RADIATION DOSE REDUCTION: This exam was performed according to the departmental dose-optimization program which includes automated exposure control, adjustment of the mA and/or kV according to patient size and/or use of iterative reconstruction technique. COMPARISON:  CT head without contrast 04/20/2023 FINDINGS: Brain: Moderate atrophy and white matter disease is stable. Remote lacunar infarcts are again noted within the thalami bilaterally. A remote lacunar infarct is present in the inferior left caudate head. No acute cortical infarct or significant interval change is present. The ventricles are proportionate to the degree of atrophy. No significant extraaxial fluid collection is present. No acute hemorrhage or mass lesion is present. Midline structures are within normal limits. The brainstem and  cerebellum are within normal limits. Vascular: Atherosclerotic calcifications are present within the cavernous internal carotid arteries bilaterally. No hyperdense vessel is present. Skull: Calvarium is intact. No focal lytic or blastic lesions are present. No significant extracranial soft tissue lesion is present. Sinuses/Orbits: Minimal fluid is present in the maxillary sinuses bilaterally. Scattered ethmoid opacification is present. The paranasal sinuses and mastoid air cells are otherwise clear. Bilateral lens replacements are noted. Globes and orbits are otherwise unremarkable. IMPRESSION: 1. No acute intracranial abnormality or significant interval change. 2. Stable atrophy and white matter disease likely reflects the sequela of chronic microvascular ischemia. 3. Remote lacunar infarcts of the thalami bilaterally and inferior left caudate head. 4. Minimal bilateral maxillary and ethmoid sinus disease. Electronically Signed   By: Marin Roberts M.D.   On: 05/08/2023 15:35   DG Chest 2 View Result Date: 05/08/2023 CLINICAL DATA:  Dizziness and fall EXAM: CHEST - 2 VIEW COMPARISON:  Chest radiograph dated 04/28/2023 FINDINGS: Patient is rotated to the right. Low lung volumes with bronchovascular crowding. Right mid lung linear and patchy opacity. Left lower lung patchy opacity. Small right pleural effusion. No pneumothorax. The heart size and mediastinal contours are within normal limits. No radiographic finding of acute displaced fracture. IMPRESSION: 1. Low lung volumes with bronchovascular crowding. Right mid lung linear and patchy opacity and left lower lung patchy opacity may reflect atelectasis, aspiration, or pneumonia. 2. Small right pleural effusion. 3.  No radiographic finding of acute displaced fracture. Electronically Signed   By: Agustin Cree M.D.   On: 05/08/2023 14:19    Data Reviewed: Relevant notes from primary care and specialist visits, past discharge summaries as available in EHR,  including Care Everywhere. Prior diagnostic testing as pertinent to current admission diagnoses, Updated medications and problem lists for reconciliation ED course, including vitals, labs, imaging, treatment and response to treatment,Triage notes, nursing and pharmacy notes and ED provider's notes Notable results as noted in HPI.Discussed case with EDMD/ ED APP/ or Specialty MD on call and as needed.  >>Assessment and Plan: * Fall at home, initial encounter Fall precautions, out of bed with assistance.   Dizziness and giddiness Patient presenting with dizziness and fall at home, in light of his blood pressure being low low normal on arrival I suspect this may be secondary to his medications that he is currently on.  Most likely patient will have to be extremely cautious taking diuretics and probably take them at night.  Unclear why patient is taking Lasix currently, echocardiogram and EF is within normal limits. Orthostatic vital signs. Compression stockings. Will consider midodrine, a.m. cortisol. Pending is Troponin / Dimer  will do CTA in am once AKI has resolved / mri brain.  Will make additional recommendation based on results.  Daughter and wife at bedside discussed with them that I suspect his initial presentation is secondary to meds causing low blood pressure however we will evaluate for heart PE and MRI of the brain.  Hypotension Vitals:   05/08/23 1326 05/08/23 1747  BP: 125/67 136/67  Orthostatic vitals, currently we will hold patient's Lasix and amlodipine. 2/2 to meds.  Will cont with gentle hydration.     Insulin dependent type 2 diabetes mellitus (HCC) Swallow evaluation/carb consistent cardiac diet/glycemic protocol.  Will currently avoid long-acting insulin, metformin, Humalog and will resume once patient is on his baseline and is confirmed that he can eat he is not having another stroke MRI of the brain is pending.  BPH (benign prostatic hyperplasia) Cont flomax  and proscar.  Strict I/O.  GERD (gastroesophageal reflux disease) IV PPI/ Aspiration precaution.  Anemia    Latest Ref Rng & Units 05/08/2023    2:05 PM 05/02/2023    4:56 AM 04/30/2023    5:52 AM  CBC  WBC 4.0 - 10.5 K/uL 10.4  7.0  8.6   Hemoglobin 13.0 - 17.0 g/dL 86.5  78.4  69.6   Hematocrit 39.0 - 52.0 % 43.5  37.9  38.0   Platelets 150 - 400 K/uL 461  296  265   H/h normal due to hemoconcentration from dehydration.  Retic cont/ guaiac / iv ppi.    Abnormal LFTs    Latest Ref Rng & Units 05/08/2023    2:05 PM 05/02/2023    4:56 AM 04/28/2023    5:08 PM  Hepatic Function  Total Protein 6.5 - 8.1 g/dL 7.4  5.5  5.9   Albumin 3.5 - 5.0 g/dL 3.6  2.5  2.9   AST 15 - 41 U/L 49  48  53   ALT 0 - 44 U/L 47  36  58   Alk Phosphatase 38 - 126 U/L 59  41  51   Total Bilirubin 0.0 - 1.2 mg/dL 0.9  1.7  1.1   Mild 2/2 steatosis ? / echo normal no chf / ? Stones / GGT pending.    DVT prophylaxis:  SCD's Consults:  None  Advance Care Planning:    Code Status: Full Code   Family Communication:  None  Disposition Plan:  TBD Severity of Illness: The appropriate patient status for this patient is OBSERVATION. Observation status is judged to be reasonable and necessary in order to  provide the required intensity of service to ensure the patient's safety. The patient's presenting symptoms, physical exam findings, and initial radiographic and laboratory data in the context of their medical condition is felt to place them at decreased risk for further clinical deterioration. Furthermore, it is anticipated that the patient will be medically stable for discharge from the hospital within 2 midnights of admission.   Author: Gertha Calkin, MD 05/08/2023 8:32 PM  For on call review [email or web address].   Unresulted Labs (From admission, onward)     Start     Ordered   05/09/23 0500  Comprehensive metabolic panel  Tomorrow morning,   R        05/08/23 1915   05/09/23 0500  CBC  Tomorrow  morning,   R        05/08/23 1915   05/08/23 2009  Lactic acid, plasma  (Lactic Acid)  Now then every 2 hours,   R     Question:  Release to patient  Answer:  Immediate   05/08/23 2008   05/08/23 1912  Occult blood card to lab, stool RN will collect  Daily,   R     Question:  Specimen to be collected by:  Answer:  RN will collect   05/08/23 1911   05/08/23 1912  Gamma GT  Once,   R        05/08/23 1915   05/08/23 1911  Reticulocytes  Once,   URGENT        05/08/23 1911   05/08/23 1852  Vitamin B12  Once,   URGENT        05/08/23 1852   05/08/23 1852  VITAMIN D 25 Hydroxy (Vit-D Deficiency, Fractures)  Add-on,   AD        05/08/23 1852   05/08/23 1333  Urinalysis, Routine w reflex microscopic -Urine, Clean Catch  Once,   URGENT       Question:  Specimen Source  Answer:  Urine, Clean Catch   05/08/23 1333            Orders Placed This Encounter  Procedures   Resp panel by RT-PCR (RSV, Flu A&B, Covid) Anterior Nasal Swab   DG Chest 2 View   CT Head Wo Contrast   MR BRAIN WO CONTRAST   Comprehensive metabolic panel   CBC with Differential   Urinalysis, Routine w reflex microscopic -Urine, Clean Catch   D-dimer, quantitative   Vitamin B12   VITAMIN D 25 Hydroxy (Vit-D Deficiency, Fractures)   Reticulocytes   Occult blood card to lab, stool RN will collect   Comprehensive metabolic panel   CBC   Gamma GT   Lactic acid, plasma   Diet heart healthy/carb modified Room service appropriate? Yes; Fluid consistency: Thin   Orthostatic vital signs   Ambulate with assistance   Orthostatic vital signs   Maintain IV access   Vital signs   Notify physician (specify)   Mobility Protocol: No Restrictions RN to initiate protocols based on patient's level of care   Refer to Sidebar Report Refer to ICU, Med-Surg, Progressive, and Step-Down Mobility Protocol Sidebars   Initiate Adult Central Line Maintenance and Catheter Protocol for patients with central line (CVC, PICC, Port,  Hemodialysis, Trialysis)   Daily weights   Intake and Output   Do not place and if present remove PureWick   Initiate Oral Care Protocol   Initiate Carrier Fluid Protocol   RN may order General Admission PRN Orders utilizing "General Admission PRN  medications" (through manage orders) for the following patient needs: allergy symptoms (Claritin), cold sores (Carmex), cough (Robitussin DM), eye irritation (Liquifilm Tears), hemorrhoids (Tucks), indigestion (Maalox), minor skin irritation (Hydrocortisone Cream), muscle pain Romeo Apple Gay), nose irritation (saline nasal spray) and sore throat (Chloraseptic spray).   Cardiac Monitoring Continuous x 24 hours Indications for use: Other; other indications for use: suspect syncope   Place and maintain sequential compression device   Full code   Consult to hospitalist   Pulse oximetry check with vital signs   Oxygen therapy Mode or (Route): Nasal cannula; Liters Per Minute: 2; Keep O2 saturation between: greater than 92 %   ED EKG   EKG 12-Lead   Place in observation (patient's expected length of stay will be less than 2 midnights)   Fall precautions   Aspiration precautions   Scheduled Meds:  buPROPion  75 mg Oral BID   pantoprazole (PROTONIX) IV  40 mg Intravenous Q12H   sodium chloride flush  3 mL Intravenous Q12H   Continuous Infusions:  sodium chloride     PRN Meds:.acetaminophen **OR** acetaminophen, morphine injection

## 2023-05-08 NOTE — Assessment & Plan Note (Addendum)
    Latest Ref Rng & Units 05/08/2023    2:05 PM 05/02/2023    4:56 AM 04/28/2023    5:08 PM  Hepatic Function  Total Protein 6.5 - 8.1 g/dL 7.4  5.5  5.9   Albumin 3.5 - 5.0 g/dL 3.6  2.5  2.9   AST 15 - 41 U/L 49  48  53   ALT 0 - 44 U/L 47  36  58   Alk Phosphatase 38 - 126 U/L 59  41  51   Total Bilirubin 0.0 - 1.2 mg/dL 0.9  1.7  1.1   Mild 2/2 steatosis ? / echo normal no chf / ? Stones / GGT pending.

## 2023-05-08 NOTE — Assessment & Plan Note (Signed)
Fall precautions, out of bed with assistance.

## 2023-05-08 NOTE — Assessment & Plan Note (Addendum)
Vitals:   05/08/23 1326 05/08/23 1747  BP: 125/67 136/67  Orthostatic vitals, currently we will hold patient's Lasix and amlodipine. 2/2 to meds.  Will cont with gentle hydration.

## 2023-05-08 NOTE — Assessment & Plan Note (Signed)
IV PPI. Aspiration precaution.

## 2023-05-08 NOTE — ED Notes (Signed)
Pt dizzy upon standing , B/p dropped to 91/523 , MD made aware

## 2023-05-08 NOTE — ED Notes (Signed)
Patient transported to CT scan. JRPRN

## 2023-05-08 NOTE — Assessment & Plan Note (Signed)
Cont flomax and proscar.  Strict I/O.

## 2023-05-08 NOTE — Assessment & Plan Note (Signed)
Swallow evaluation/carb consistent cardiac diet/glycemic protocol.  Will currently avoid long-acting insulin, metformin, Humalog and will resume once patient is on his baseline and is confirmed that he can eat he is not having another stroke MRI of the brain is pending.

## 2023-05-08 NOTE — Assessment & Plan Note (Signed)
    Latest Ref Rng & Units 05/08/2023    2:05 PM 05/02/2023    4:56 AM 04/30/2023    5:52 AM  CBC  WBC 4.0 - 10.5 K/uL 10.4  7.0  8.6   Hemoglobin 13.0 - 17.0 g/dL 16.1  09.6  04.5   Hematocrit 39.0 - 52.0 % 43.5  37.9  38.0   Platelets 150 - 400 K/uL 461  296  265   H/h normal due to hemoconcentration from dehydration.  Retic cont/ guaiac / iv ppi.

## 2023-05-08 NOTE — ED Triage Notes (Signed)
Patient BIB EMS from home due to weakness and fall. 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 Patient states he got up this morning to use bathroom and got dizzy and fell. Denies any LOC, or hitting his head. Patient remembered falling and denies injury or pain. Patient did have RSV two weeks ago and still weak from virus. Patients wife states she can't take care of patient at home alone, and patient needs placement for further rehab.   Per EMS pressure on arrival sitting was 78/42, en route to ED 126/73.

## 2023-05-08 NOTE — Assessment & Plan Note (Addendum)
Patient presenting with dizziness and fall at home, in light of his blood pressure being low low normal on arrival I suspect this may be secondary to his medications that he is currently on.  Most likely patient will have to be extremely cautious taking diuretics and probably take them at night.  Unclear why patient is taking Lasix currently, echocardiogram and EF is within normal limits. Orthostatic vital signs. Compression stockings. Will consider midodrine, a.m. cortisol. Pending is Troponin / Dimer  will do CTA in am once AKI has resolved / mri brain.  Will make additional recommendation based on results.  Daughter and wife at bedside discussed with them that I suspect his initial presentation is secondary to meds causing low blood pressure however we will evaluate for heart PE and MRI of the brain.

## 2023-05-09 ENCOUNTER — Observation Stay (HOSPITAL_COMMUNITY): Payer: Medicare Other

## 2023-05-09 ENCOUNTER — Observation Stay (HOSPITAL_BASED_OUTPATIENT_CLINIC_OR_DEPARTMENT_OTHER): Payer: Medicare Other

## 2023-05-09 ENCOUNTER — Other Ambulatory Visit: Payer: Self-pay

## 2023-05-09 DIAGNOSIS — S199XXA Unspecified injury of neck, initial encounter: Secondary | ICD-10-CM | POA: Diagnosis not present

## 2023-05-09 DIAGNOSIS — I2699 Other pulmonary embolism without acute cor pulmonale: Secondary | ICD-10-CM | POA: Diagnosis not present

## 2023-05-09 DIAGNOSIS — Y92009 Unspecified place in unspecified non-institutional (private) residence as the place of occurrence of the external cause: Secondary | ICD-10-CM

## 2023-05-09 DIAGNOSIS — R55 Syncope and collapse: Secondary | ICD-10-CM | POA: Diagnosis not present

## 2023-05-09 DIAGNOSIS — W19XXXA Unspecified fall, initial encounter: Secondary | ICD-10-CM | POA: Diagnosis not present

## 2023-05-09 DIAGNOSIS — M7989 Other specified soft tissue disorders: Secondary | ICD-10-CM | POA: Diagnosis not present

## 2023-05-09 DIAGNOSIS — M47812 Spondylosis without myelopathy or radiculopathy, cervical region: Secondary | ICD-10-CM | POA: Diagnosis not present

## 2023-05-09 DIAGNOSIS — G319 Degenerative disease of nervous system, unspecified: Secondary | ICD-10-CM | POA: Diagnosis not present

## 2023-05-09 DIAGNOSIS — J90 Pleural effusion, not elsewhere classified: Secondary | ICD-10-CM | POA: Diagnosis not present

## 2023-05-09 DIAGNOSIS — R531 Weakness: Secondary | ICD-10-CM | POA: Diagnosis not present

## 2023-05-09 DIAGNOSIS — J9811 Atelectasis: Secondary | ICD-10-CM | POA: Diagnosis not present

## 2023-05-09 DIAGNOSIS — I639 Cerebral infarction, unspecified: Secondary | ICD-10-CM | POA: Diagnosis not present

## 2023-05-09 LAB — CBC
HCT: 34.3 % — ABNORMAL LOW (ref 39.0–52.0)
Hemoglobin: 11 g/dL — ABNORMAL LOW (ref 13.0–17.0)
MCH: 27.1 pg (ref 26.0–34.0)
MCHC: 32.1 g/dL (ref 30.0–36.0)
MCV: 84.5 fL (ref 80.0–100.0)
Platelets: 381 10*3/uL (ref 150–400)
RBC: 4.06 MIL/uL — ABNORMAL LOW (ref 4.22–5.81)
RDW: 15.7 % — ABNORMAL HIGH (ref 11.5–15.5)
WBC: 8.1 10*3/uL (ref 4.0–10.5)
nRBC: 0 % (ref 0.0–0.2)

## 2023-05-09 LAB — COMPREHENSIVE METABOLIC PANEL
ALT: 45 U/L — ABNORMAL HIGH (ref 0–44)
AST: 42 U/L — ABNORMAL HIGH (ref 15–41)
Albumin: 2.9 g/dL — ABNORMAL LOW (ref 3.5–5.0)
Alkaline Phosphatase: 42 U/L (ref 38–126)
Anion gap: 11 (ref 5–15)
BUN: 14 mg/dL (ref 8–23)
CO2: 24 mmol/L (ref 22–32)
Calcium: 8.7 mg/dL — ABNORMAL LOW (ref 8.9–10.3)
Chloride: 102 mmol/L (ref 98–111)
Creatinine, Ser: 0.92 mg/dL (ref 0.61–1.24)
GFR, Estimated: >60 mL/min (ref >60)
Glucose, Bld: 116 mg/dL — ABNORMAL HIGH (ref 70–99)
Potassium: 3.8 mmol/L (ref 3.5–5.1)
Sodium: 137 mmol/L (ref 135–145)
Total Bilirubin: 1 mg/dL (ref 0.0–1.2)
Total Protein: 5.5 g/dL — ABNORMAL LOW (ref 6.5–8.1)

## 2023-05-09 LAB — CBG MONITORING, ED
Glucose-Capillary: 170 mg/dL — ABNORMAL HIGH (ref 70–99)
Glucose-Capillary: 153 mg/dL — ABNORMAL HIGH (ref 70–99)
Glucose-Capillary: 136 mg/dL — ABNORMAL HIGH (ref 70–99)

## 2023-05-09 LAB — GAMMA GT: GGT: 77 U/L — ABNORMAL HIGH (ref 7–50)

## 2023-05-09 LAB — LACTIC ACID, PLASMA: Lactic Acid, Venous: 1.7 mmol/L (ref 0.5–1.9)

## 2023-05-09 LAB — VITAMIN D 25 HYDROXY (VIT D DEFICIENCY, FRACTURES): Vit D, 25-Hydroxy: 52.78 ng/mL (ref 30–100)

## 2023-05-09 LAB — VITAMIN B12: Vitamin B-12: 475 pg/mL (ref 180–914)

## 2023-05-09 MED ORDER — INSULIN ASPART 100 UNIT/ML IJ SOLN
0.0000 [IU] | Freq: Three times a day (TID) | INTRAMUSCULAR | Status: DC
  Administered 2023-05-09 – 2023-05-11 (×5): 2 [IU] via SUBCUTANEOUS
  Administered 2023-05-11: 3 [IU] via SUBCUTANEOUS
  Administered 2023-05-11: 5 [IU] via SUBCUTANEOUS
  Administered 2023-05-12 (×2): 2 [IU] via SUBCUTANEOUS
  Administered 2023-05-12: 1 [IU] via SUBCUTANEOUS
  Administered 2023-05-13 (×2): 2 [IU] via SUBCUTANEOUS
  Administered 2023-05-14 (×2): 3 [IU] via SUBCUTANEOUS
  Filled 2023-05-09: qty 0.09

## 2023-05-09 MED ORDER — INSULIN GLARGINE-YFGN 100 UNIT/ML ~~LOC~~ SOLN
10.0000 [IU] | Freq: Every day | SUBCUTANEOUS | Status: DC
Start: 2023-05-09 — End: 2023-05-14
  Administered 2023-05-09 – 2023-05-14 (×6): 10 [IU] via SUBCUTANEOUS
  Filled 2023-05-09 (×6): qty 0.1

## 2023-05-09 MED ORDER — IOHEXOL 350 MG/ML SOLN
75.0000 mL | Freq: Once | INTRAVENOUS | Status: AC | PRN
  Administered 2023-05-09: 75 mL via INTRAVENOUS

## 2023-05-09 MED ORDER — FINASTERIDE 5 MG PO TABS
5.0000 mg | ORAL_TABLET | Freq: Every day | ORAL | Status: DC
  Administered 2023-05-09 – 2023-05-14 (×6): 5 mg via ORAL
  Filled 2023-05-09 (×6): qty 1

## 2023-05-09 MED ORDER — TAMOXIFEN CITRATE 10 MG PO TABS
20.0000 mg | ORAL_TABLET | Freq: Every day | ORAL | Status: DC
Start: 2023-05-09 — End: 2023-05-10
  Administered 2023-05-09 – 2023-05-10 (×2): 20 mg via ORAL
  Filled 2023-05-09 (×2): qty 2

## 2023-05-09 MED ORDER — APIXABAN 5 MG PO TABS
10.0000 mg | ORAL_TABLET | Freq: Two times a day (BID) | ORAL | Status: DC
  Administered 2023-05-09 – 2023-05-11 (×4): 10 mg via ORAL
  Filled 2023-05-09 (×4): qty 2

## 2023-05-09 MED ORDER — TAMSULOSIN HCL 0.4 MG PO CAPS
0.4000 mg | ORAL_CAPSULE | Freq: Every day | ORAL | Status: DC
  Administered 2023-05-09 – 2023-05-14 (×6): 0.4 mg via ORAL
  Filled 2023-05-09 (×6): qty 1

## 2023-05-09 MED ORDER — PANTOPRAZOLE SODIUM 40 MG PO TBEC
40.0000 mg | DELAYED_RELEASE_TABLET | Freq: Every day | ORAL | Status: DC
  Administered 2023-05-09 – 2023-05-14 (×6): 40 mg via ORAL
  Filled 2023-05-09 (×6): qty 1

## 2023-05-09 MED ORDER — POLYETHYLENE GLYCOL 3350 17 G PO PACK
17.0000 g | PACK | Freq: Two times a day (BID) | ORAL | Status: DC
  Administered 2023-05-12: 17 g via ORAL
  Filled 2023-05-09 (×6): qty 1

## 2023-05-09 MED ORDER — APIXABAN 5 MG PO TABS
5.0000 mg | ORAL_TABLET | Freq: Two times a day (BID) | ORAL | Status: DC
Start: 2023-05-16 — End: 2023-05-11

## 2023-05-09 MED ORDER — INSULIN ASPART 100 UNIT/ML IJ SOLN
0.0000 [IU] | Freq: Every day | INTRAMUSCULAR | Status: DC
  Administered 2023-05-10 – 2023-05-13 (×2): 2 [IU] via SUBCUTANEOUS
  Filled 2023-05-09: qty 0.05

## 2023-05-09 MED ORDER — ATORVASTATIN CALCIUM 80 MG PO TABS
80.0000 mg | ORAL_TABLET | Freq: Every day | ORAL | Status: DC
  Administered 2023-05-09 – 2023-05-13 (×5): 80 mg via ORAL
  Filled 2023-05-09 (×2): qty 2
  Filled 2023-05-09 (×3): qty 1

## 2023-05-09 MED ORDER — ASPIRIN 81 MG PO TBEC
81.0000 mg | DELAYED_RELEASE_TABLET | Freq: Every day | ORAL | Status: DC
  Filled 2023-05-09: qty 1

## 2023-05-09 MED ORDER — APIXABAN 5 MG PO TABS
10.0000 mg | ORAL_TABLET | ORAL | Status: AC
  Administered 2023-05-09: 10 mg via ORAL
  Filled 2023-05-09: qty 2

## 2023-05-09 MED ORDER — CLOPIDOGREL BISULFATE 75 MG PO TABS
75.0000 mg | ORAL_TABLET | Freq: Every day | ORAL | Status: DC
  Filled 2023-05-09: qty 1

## 2023-05-09 NOTE — Evaluation (Signed)
Physical Therapy Evaluation Patient Details Name: John [name] MRN: [id number] DOB: 12-02-1945 Today's Date: 05/09/2023  History of Present Illness  78 yo male presents to therapy from ED on 05/08/2023 due to fall in home setting. Pt recently d/c from rehab at [facility] following CVA. Imaging no acute fx, opacity on CXR and infarcts on MRI with no apparent change from 04/19/2023. Pt PMH includes but is not limited to: hypotension, GERD, anemia, BPH, GERD, falls, vertigo, CVA and breast ca.  Clinical Impression   Pt admitted with above diagnosis.  Pt currently with functional limitations due to the deficits listed below (see PT Problem List). PT eval in ED today with pt on stretcher. Pt agreeable to therapy intervention and indicated maybe he was not ready to d/c home from CIR and that he has lost a lot of confidence in his mobility following fall. Pt required occasional cues for attention to task and apparent difficulty relaying required assist or use of AD since returning home following CVA. No family present. Pt required S and cues for supine <> sit, CGA for sit to stand at RW, pt able to maintain static standing balance with B UE support at RW and close S. Pt indicated he felt like his legs were going to give out on him while PT assessing for orthostatic hypotension (please see below) and unable to safely progress with gait tasks at time of eval. Pt returned to stretcher and all needs in place. Pt will benefit from ongoing therapy services following hospital d/c. Patient will benefit from intensive inpatient follow up therapy, >3 hours/day.  Pt will benefit from acute skilled PT to increase their independence and safety with mobility to allow discharge.    Bp semi reclined in stretcher 136/73 Bp seated EOB 106/51 Bp immediate standing 83/53 Bp s/p 3 min standing 82/61 Bp upon returning to semi reclined 121/76 Pt indicated feeling light headed, B LE instability R > L and requested to sit.       If  plan is discharge home, recommend the following: A lot of help with walking and/or transfers;A little help with bathing/dressing/bathroom;Assist for transportation;Assistance with cooking/housework;Help with stairs or ramp for entrance   Can travel by private vehicle        Equipment Recommendations None recommended by PT  Recommendations for Other Services       Functional Status Assessment Patient has had a recent decline in their functional status and demonstrates the ability to make significant improvements in function in a reasonable and predictable amount of time.     Precautions / Restrictions Precautions Precautions: Fall;Other (comment) (orthostatic) Precaution Comments: Chronic vertigo; HOH; monitor for orthostatic hypotension (wears thigh high TED hose) Restrictions Weight Bearing Restrictions Per Provider Order: No      Mobility  Bed Mobility Overal bed mobility: Needs Assistance Bed Mobility: Supine to Sit, Sit to Supine     Supine to sit: Supervision Sit to supine: Supervision   General bed mobility comments: min cues    Transfers Overall transfer level: Needs assistance Equipment used: Rolling walker (2 wheels) Transfers: Sit to/from Stand Sit to Stand: Contact guard assist           General transfer comment: min cues for proper UE placement and pt required use of B UE support at Rw for balance    Ambulation/Gait               General Gait Details: NT due to pt hypotension for safety and pt stating that his  legs were going to give out on him  Stairs            Wheelchair Mobility     Tilt Bed    Modified Rankin (Stroke Patients Only)       Balance Overall balance assessment: Needs assistance Sitting-balance support: Feet supported Sitting balance-Leahy Scale: Fair     Standing balance support: Bilateral upper extremity supported, During functional activity, Reliant on assistive device for balance Standing balance-Leahy  Scale: Poor Standing balance comment: close S for static standing at RW with B UE support               High Level Balance Comments: worked on seated balance with pt throwing and catching small bottle. Pt had diffciulty grading his effort as well as coordinating B hands to catch. In standing, pt needed mod A during dynamic activity, worked on reaching in all planes. Pt very hesitant to reach fwd out of BOS.             Pertinent Vitals/Pain Pain Assessment Pain Assessment: No/denies pain Faces Pain Scale: No hurt    Home Living Family/patient expects to be discharged to:: Private residence Living Arrangements: Spouse/significant other Available Help at Discharge: Family;Available 24 hours/day Type of Home: House Home Access: Stairs to enter Entrance Stairs-Rails: Right;Left;Can reach both Entrance Stairs-Number of Steps: 3 Alternate Level Stairs-Number of Steps: 14 Home Layout: Two level;Able to live on main level with bedroom/bathroom Home Equipment: Rolling Walker (2 wheels);BSC/3in1      Prior Function Prior Level of Function : Independent/Modified Independent;Driving;Patient poor historian/Family not available             Mobility Comments: mod I with use of RW following CVA ADLs Comments: pt unable to communicate required A in home setting with pt d/c from CIR Saturday and returnign to hospital on Sunday     Extremity/Trunk Assessment        Lower Extremity Assessment Lower Extremity Assessment: Generalized weakness    Cervical / Trunk Assessment Cervical / Trunk Assessment: Other exceptions (forward head)  Communication   Communication Communication: No apparent difficulties  Cognition Arousal: Alert Behavior During Therapy: WFL for tasks assessed/performed Overall Cognitive Status: Within Functional Limits for tasks assessed                                 General Comments: pt requires increased time for motor processing and planing  with occational redirection to remain on task        General Comments General comments (skin integrity, edema, etc.): results from doppler pending, inital report indicative of LLE DVT with PT made aware s/p eval    Exercises     Assessment/Plan    PT Assessment Patient needs continued PT services  PT Problem List Decreased strength;Decreased activity tolerance;Decreased balance;Decreased mobility;Decreased cognition;Decreased coordination       PT Treatment Interventions DME instruction;Gait training;Functional mobility training;Therapeutic activities;Stair training;Therapeutic exercise;Balance training;Neuromuscular re-education;Patient/family education;Cognitive remediation    PT Goals (Current goals can be found in the Care Plan section)  Acute Rehab PT Goals Patient Stated Goal: to know what is going on and improve PT Goal Formulation: With patient Time For Goal Achievement: 05/23/23 Potential to Achieve Goals: Fair    Frequency Min 1X/week     Co-evaluation               AM-PAC PT "6 Clicks" Mobility  Outcome Measure Help needed turning from your  back to your side while in a flat bed without using bedrails?: A Little   Help needed moving to and from a bed to a chair (including a wheelchair)?: A Little Help needed standing up from a chair using your arms (e.g., wheelchair or bedside chair)?: A Little Help needed to walk in hospital room?: Total   6 Click Score: 10    End of Session Equipment Utilized During Treatment: Gait belt Activity Tolerance: Patient limited by fatigue;Other (comment) (light headedness) Patient left: in bed;with call bell/phone within reach Nurse Communication: Mobility status PT Visit Diagnosis: Other abnormalities of gait and mobility (R26.89);Difficulty in walking, not elsewhere classified (R26.2);Other symptoms and signs involving the nervous system (R29.898);Dizziness and giddiness (R42);Unsteadiness on feet (R26.81);History of  falling (Z91.81)    Time: 2956-2130 PT Time Calculation (min) (ACUTE ONLY): 27 min   Charges:   PT Evaluation $PT Eval Low Complexity: 1 Low PT Treatments $Therapeutic Activity: 8-22 mins PT General Charges $$ ACUTE PT VISIT: 1 Visit         Johnny Bridge, PT Acute Rehab   Jacqualyn Posey 05/09/2023, 2:30 PM

## 2023-05-09 NOTE — Progress Notes (Signed)
   Inpatient Rehab Admissions Coordinator :  Per therapy recommendations patient was screened for CIR candidacy by Ottie Glazier RN MSN. Patient does not appear to demonstrate the medical neccesity for a Hospital Rehabilitation /CIR admit. Currently under observation status. I will not place a Rehab Consult. Recommend other Rehab Venues to be pursued. Please contact me with any questions.  Ottie Glazier RN MSN Admissions Coordinator [phone]

## 2023-05-09 NOTE — Progress Notes (Addendum)
PROGRESS NOTE    John [name]  WUJ:[id number] DOB: 05-30-1945 DOA: 05/08/2023 PCP: Sharlene Dory, DO  No chief complaint on file.   Brief Narrative:   78 yo with hx recent cerebellar stroke, breast cancer on tamoxifen, T2DM, neuropathy, vertigo as well as other medical issues here after recent discharge from inpatient rehab (from his cerebellar stroke) with Senetra Dillin fall and episode of suspected syncope with hypotension on arrival.   Assessment & Plan:   Principal Problem:   Fall at home, initial encounter Active Problems:   Dizziness and giddiness   Hypotension   Insulin dependent type 2 diabetes mellitus (HCC)   BPH (benign prostatic hyperplasia)   GERD (gastroesophageal reflux disease)   Abnormal LFTs   Anemia  Addendum Pulmonary Embolism  DVT Start on eliquis Limited echo Will hold antiplatelet therapy while he's on anticoagulation  Syncope  Orthostatic Hypotension (more likely) vs Reflex Syncope  Fall Was on toilet and couldn't get up, just as his wife got him up (took  2-3 attempts), his legs gave out (she thinks he passed out - he laid in the shower for Jashae Wiggs minute or so) (into the shower) Ddx for his fall includes syncope related to orthostatic hypotension (given low BP's on arrival) or vasovagal syncope (micturition syncope?) His echo from 04/2023 -> EF 04/2023, grade 1 diastolic, no AS Head CT without acute intracranial abnormality Will get CT c spine given fall MRI with punctate acute infarct in the right middle cerebellar peduncle (discussed with neurology, this is the stroke from 1/7) D dimer elevated, follow CT PE protocol and LE [place] Normal TSH from 03/2023.   Check AM cortisol  Check daily orthostatics, hold amlodipine, hold lasix (notably is on flomax, will continue cautiously for now) Start with thigh high compression stockings PT/OT  Cerebellar Stroke Recently discharged from rehab for this Continue statin. DAPTx3 weeks (aspirin to be completed  1/29), then plavix alone.  As above, MRI demonstrates the stroke diagnosed earlier this month, no need for new neurology consult at this time.    Poor PO intake RD consult  Type 2 Diabetes A1c 12.4 in Dec 2024 Apparently on 45 units toujeo BID at home with 25 units lispo once daily in AM? (Metformin as well) Will start basal back at low dose and monitor as BG's ok right now  Hypertension Holding amlodipine, lasix   BPH Flomax, proscar  GERD PPI   Abnormal LFT's  Hepatic Steatosis Mild, abnormal GGT Consider further w/u outpatient - suspect this is related to known fatty liver disease    DVT prophylaxis: SCD Code Status: full Family Communication: none Disposition:   Status is: Observation The patient remains OBS appropriate and will d/c before 2 midnights.   Consultants:  neurology  Procedures:  none  Antimicrobials:  Anti-infectives (From admission, onward)    None       Subjective: No complaints, notes he woke up on the ground after going to the bathroom Denies vertigo, but did note lightheadedness, though poorly describes this  Objective: Vitals:   05/09/23 0146 05/09/23 0406 05/09/23 0527 05/09/23 0715  BP:  (!) 124/46  (!) 148/61  Pulse:  81  73  Resp:  18  16  Temp: 98.6 F (37 C)  98.2 F (36.8 C)   TempSrc: Oral  Oral   SpO2:  94%  95%   No intake or output data in the 24 hours ending 05/09/23 0800 There were no vitals filed for this visit.  Examination:  General  exam: Appears calm and comfortable  Respiratory system: unlabored Cardiovascular system: RRR Gastrointestinal system: s/nt/nd Central nervous system: Alert and oriented. Moving all extremities. Extremities: no LEE    Data Reviewed: I have personally reviewed following labs and imaging studies  CBC: Recent Labs  Lab 05/08/23 1405 05/09/23 0454  WBC 10.4 8.1  NEUTROABS 8.5*  --   HGB 13.7 11.0*  HCT 43.5 34.3*  MCV 85.6 84.5  PLT 461* 381    Basic Metabolic  Panel: Recent Labs  Lab 05/08/23 1405 05/09/23 0454  NA 138 137  K 4.6 3.8  CL 100 102  CO2 22 24  GLUCOSE 123* 116*  BUN 13 14  CREATININE 1.32* 0.92  CALCIUM 9.9 8.7*    GFR: CrCl cannot be calculated (Unknown ideal weight.).  Liver Function Tests: Recent Labs  Lab 05/08/23 1405 05/09/23 0454  AST 49* 42*  ALT 47* 45*  ALKPHOS 59 42  BILITOT 0.9 1.0  PROT 7.4 5.5*  ALBUMIN 3.6 2.9*    CBG: Recent Labs  Lab 05/06/23 0645 05/06/23 1124 05/06/23 1704 05/06/23 2119 05/07/23 0546  GLUCAP 200* 204* 168* 141* 132*     Recent Results (from the past 240 hours)  SARS Coronavirus 2 by RT PCR (hospital order, performed in [facility] hospital lab) *cepheid single result test* Anterior Nasal Swab     Status: None   Collection Time: 04/29/23  4:26 PM   Specimen: Anterior Nasal Swab  Result Value Ref Range Status   SARS Coronavirus 2 by RT PCR NEGATIVE NEGATIVE Final    Comment: Performed at [facility], [address] [place], [place] [address]  Respiratory (~20 pathogens) panel by PCR     Status: Abnormal   Collection Time: 04/29/23  4:40 PM   Specimen: Nasopharyngeal Swab; Respiratory  Result Value Ref Range Status   Adenovirus NOT DETECTED NOT DETECTED Final   Coronavirus 229E NOT DETECTED NOT DETECTED Final    Comment: (NOTE) The Coronavirus on the Respiratory Panel, DOES NOT test for the novel  Coronavirus (2019 nCoV)    Coronavirus HKU1 NOT DETECTED NOT DETECTED Final   Coronavirus NL63 NOT DETECTED NOT DETECTED Final   Coronavirus OC43 NOT DETECTED NOT DETECTED Final   Metapneumovirus DETECTED (Kaynen Minner) NOT DETECTED Final   Rhinovirus / Enterovirus NOT DETECTED NOT DETECTED Final   Influenza Michial Disney NOT DETECTED NOT DETECTED Final   Influenza B NOT DETECTED NOT DETECTED Final   Parainfluenza Virus 1 NOT DETECTED NOT DETECTED Final   Parainfluenza Virus 2 NOT DETECTED NOT DETECTED Final   Parainfluenza Virus 3 NOT DETECTED NOT DETECTED Final   Parainfluenza  Virus 4 NOT DETECTED NOT DETECTED Final   Respiratory Syncytial Virus NOT DETECTED NOT DETECTED Final   Bordetella pertussis NOT DETECTED NOT DETECTED Final   Bordetella Parapertussis NOT DETECTED NOT DETECTED Final   Chlamydophila pneumoniae NOT DETECTED NOT DETECTED Final   Mycoplasma pneumoniae NOT DETECTED NOT DETECTED Final    Comment: Performed at [facility], [address], [place], [place] [address]  Resp panel by RT-PCR (RSV, Flu Kittie Krizan&B, Covid)     Status: None   Collection Time: 05/08/23  6:35 PM   Specimen: Nasal Swab  Result Value Ref Range Status   SARS Coronavirus 2 by RT PCR NEGATIVE NEGATIVE Final    Comment: (NOTE) SARS-CoV-2 target nucleic acids are NOT DETECTED.  The SARS-CoV-2 RNA is generally detectable in upper respiratory specimens during the acute phase of infection. The lowest concentration of  SARS-CoV-2 viral copies this assay can detect is 138 copies/mL. Reniyah Gootee negative result does not preclude SARS-Cov-2 infection and should not be used as the sole basis for treatment or other patient management decisions. Gurleen Larrivee negative result may occur with  improper specimen collection/handling, submission of specimen other than nasopharyngeal swab, presence of viral mutation(s) within the areas targeted by this assay, and inadequate number of viral copies(<138 copies/mL). Yoshie Kosel negative result must be combined with clinical observations, patient history, and epidemiological information. The expected result is Negative.  Fact Sheet for Patients:  BloggerCourse.com  Fact Sheet for Healthcare Providers:  SeriousBroker.it  This test is no t yet approved or cleared by the [place] FDA and  has been authorized for detection and/or diagnosis of SARS-CoV-2 by FDA under an Emergency Use Authorization (EUA). This EUA will remain  in effect (meaning this test can be used) for the duration of the COVID-19 declaration under Section  564(b)(1) of the Act, 21 U.S.C.section 360bbb-3(b)(1), unless the authorization is terminated  or revoked sooner.       Influenza Moody Robben by PCR NEGATIVE NEGATIVE Final   Influenza B by PCR NEGATIVE NEGATIVE Final    Comment: (NOTE) The Xpert Xpress SARS-CoV-2/FLU/RSV plus assay is intended as an aid in the diagnosis of influenza from Nasopharyngeal swab specimens and should not be used as Kamil Hanigan sole basis for treatment. Nasal washings and aspirates are unacceptable for Xpert Xpress SARS-CoV-2/FLU/RSV testing.  Fact Sheet for Patients: BloggerCourse.com  Fact Sheet for Healthcare Providers: SeriousBroker.it  This test is not yet approved or cleared by the [place] FDA and has been authorized for detection and/or diagnosis of SARS-CoV-2 by FDA under an Emergency Use Authorization (EUA). This EUA will remain in effect (meaning this test can be used) for the duration of the COVID-19 declaration under Section 564(b)(1) of the Act, 21 U.S.C. section 360bbb-3(b)(1), unless the authorization is terminated or revoked.     Resp Syncytial Virus by PCR NEGATIVE NEGATIVE Final    Comment: (NOTE) Fact Sheet for Patients: BloggerCourse.com  Fact Sheet for Healthcare Providers: SeriousBroker.it  This test is not yet approved or cleared by the [place] FDA and has been authorized for detection and/or diagnosis of SARS-CoV-2 by FDA under an Emergency Use Authorization (EUA). This EUA will remain in effect (meaning this test can be used) for the duration of the COVID-19 declaration under Section 564(b)(1) of the Act, 21 U.S.C. section 360bbb-3(b)(1), unless the authorization is terminated or revoked.  Performed at [facility], [address], [place], [place] [address]          Radiology Studies: MR BRAIN WO CONTRAST Result Date: 05/09/2023 CLINICAL DATA:   Syncope/presyncope, cerebrovascular cause suspected EXAM: MRI HEAD WITHOUT CONTRAST TECHNIQUE: Multiplanar, multiecho pulse sequences of the brain and surrounding structures were obtained without intravenous contrast. COMPARISON:  CT head 05/08/2023. FINDINGS: Brain: Punctate acute infarct in the right middle cerebellar peduncle. Moderate patchy T2/FLAIR hyperintensities in the white matter nonspecific but compatible with chronic microvascular ischemic disease. Cerebral atrophy. No evidence of acute hemorrhage, mass lesion or midline shift. Remote infarcts in bilateral thalami in the pons. Vascular: Major arterial flow voids are maintained Skull and upper cervical spine: Normal marrow signal. Sinuses/Orbits: Paranasal sinus mucosal thickening. No acute orbital findings. Other: No mastoid effusions. IMPRESSION: 1. Punctate acute infarct in the right middle cerebellar peduncle. 2. 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 Electronically Signed   By: Feliberto Harts M.D.   On: 05/09/2023 00:31  CT Head Wo Contrast Result Date: 05/08/2023 CLINICAL DATA:  Head trauma, minor.  Weakness.  Fall. EXAM: CT HEAD WITHOUT CONTRAST TECHNIQUE: Contiguous axial images were obtained from the base of the skull through the vertex without intravenous contrast. RADIATION DOSE REDUCTION: This exam was performed according to the departmental dose-optimization program which includes automated exposure control, adjustment of the mA and/or kV according to patient size and/or use of iterative reconstruction technique. COMPARISON:  CT head without contrast 04/20/2023 FINDINGS: Brain: Moderate atrophy and white matter disease is stable. Remote lacunar infarcts are again noted within the thalami bilaterally. Errika Narvaiz remote lacunar infarct is present in the inferior left caudate head. No acute cortical infarct or significant interval change is present. The ventricles are proportionate to the degree of atrophy. No significant  extraaxial fluid collection is present. No acute hemorrhage or mass lesion is present. Midline structures are within normal limits. The brainstem and cerebellum are within normal limits. Vascular: Atherosclerotic calcifications are present within the cavernous internal carotid arteries bilaterally. No hyperdense vessel is present. Skull: Calvarium is intact. No focal lytic or blastic lesions are present. No significant extracranial soft tissue lesion is present. Sinuses/Orbits: Minimal fluid is present in the maxillary sinuses bilaterally. Scattered ethmoid opacification is present. The paranasal sinuses and mastoid air cells are otherwise clear. Bilateral lens replacements are noted. Globes and orbits are otherwise unremarkable. IMPRESSION: 1. No acute intracranial abnormality or significant interval change. 2. Stable atrophy and white matter disease likely reflects the sequela of chronic microvascular ischemia. 3. Remote lacunar infarcts of the thalami bilaterally and inferior left caudate head. 4. Minimal bilateral maxillary and ethmoid sinus disease. Electronically Signed   By: Marin Roberts M.D.   On: 05/08/2023 15:35   DG Chest 2 View Result Date: 05/08/2023 CLINICAL DATA:  Dizziness and fall EXAM: CHEST - 2 VIEW COMPARISON:  Chest radiograph dated 04/28/2023 FINDINGS: Patient is rotated to the right. Low lung volumes with bronchovascular crowding. Right mid lung linear and patchy opacity. Left lower lung patchy opacity. Small right pleural effusion. No pneumothorax. The heart size and mediastinal contours are within normal limits. No radiographic finding of acute displaced fracture. IMPRESSION: 1. Low lung volumes with bronchovascular crowding. Right mid lung linear and patchy opacity and left lower lung patchy opacity may reflect atelectasis, aspiration, or pneumonia. 2. Small right pleural effusion. 3.  No radiographic finding of acute displaced fracture. Electronically Signed   By: Agustin Cree M.D.    On: 05/08/2023 14:19        Scheduled Meds:  buPROPion  75 mg Oral BID   pantoprazole (PROTONIX) IV  40 mg Intravenous Q12H   sodium chloride flush  3 mL Intravenous Q12H   Continuous Infusions:  sodium chloride 75 mL/hr at 05/08/23 2310     LOS: 0 days    Time spent: over 30 min    Lacretia Nicks, MD [facility]   To contact the attending provider between 7A-7P or the covering provider during after hours 7P-7A, please log into the web site [email or web address] and access using universal [place] password for that web site. If you do not have the password, please call the hospital operator.  05/09/2023, 8:00 AM

## 2023-05-09 NOTE — ED Notes (Signed)
PT at bedside.

## 2023-05-09 NOTE — Discharge Instructions (Addendum)
After Your Pacemaker   You have a [employer] Pacemaker  ACTIVITY Do not lift your arm above shoulder height for 1 week after your procedure. After 7 days, you may progress as below.  You should remove your sling 24 hours after your procedure, unless otherwise instructed by your provider.     Friday May 20, 2023  Saturday May 21, 2023 Sunday May 22, 2023 Monday May 23, 2023   Do not lift, push, pull, or carry anything over 10 pounds with the affected arm until 6 weeks (Friday June 24, 2023 ) after your procedure.   You may drive AFTER your wound check, unless you have been told otherwise by your provider.   Ask your healthcare provider when you can go back to work   INCISION/Dressing  Do not remove steri-strips or glue as below.   If a PRESSURE DRESSING (a bulky dressing that usually goes up over your shoulder) was applied or left in place, please follow instructions given by your provider on when to return to have this removed.   Monitor your Pacemaker site for redness, swelling, and drainage. Call the device clinic at [phone] if you experience these symptoms or fever/chills.  If your incision is sealed with Steri-strips or staples, you may shower 7 days after your procedure or when told by your provider. Do not remove the steri-strips or let the shower hit directly on your site. You may wash around your site with soap and water.    If you were discharged in a sling, please do not wear this during the day more than 48 hours after your surgery unless otherwise instructed. This may increase the risk of stiffness and soreness in your shoulder.   Avoid lotions, ointments, or perfumes over your incision until it is well-healed.  You may use a hot tub or a pool AFTER your wound check appointment if the incision is completely closed.  Pacemaker Alerts:  Some alerts are vibratory and others beep. These are NOT emergencies. Please call our office to let us know. If this  occurs at night or on weekends, it can wait until the next business day. Send a remote transmission.  If your device is capable of reading fluid status (for heart failure), you will be offered monthly monitoring to review this with you.   DEVICE MANAGEMENT Remote monitoring is used to monitor your pacemaker from home. This monitoring is scheduled every 91 days by our office. It allows [place] to keep an eye on the functioning of your device to ensure it is working properly. You will routinely see your Electrophysiologist annually (more often if necessary).   You should receive your ID card for your new device in 4-8 weeks. Keep this card with you at all times once received. Consider wearing a medical alert bracelet or necklace.  Your Pacemaker may be MRI compatible. This will be discussed at your next office visit/wound check.  You should avoid contact with strong electric or magnetic fields.   Do not use amateur (ham) radio equipment or electric (arc) welding torches. MP3 player headphones with magnets should not be used. Some devices are safe to use if held at least 12 inches (30 cm) from your Pacemaker. These include power tools, lawn mowers, and speakers. If you are unsure if something is safe to use, ask your health care provider.  When using your cell phone, hold it to the ear that is on the opposite side from the Pacemaker. Do not leave your cell phone in  a pocket over the Pacemaker.  You may safely use electric blankets, heating pads, computers, and microwave ovens.  Call the office right away if: You have chest pain. You feel more short of breath than you have felt before. You feel more light-headed than you have felt before. Your incision starts to open up.  This information is not intended to replace advice given to you by your health care provider. Make sure you discuss any questions you have with your health care provider.     Information on my medicine - ELIQUIS  (apixaban)   Why was Eliquis prescribed for you? Eliquis was prescribed to treat blood clots that may have been found in the veins of your legs (deep vein thrombosis) or in your lungs (pulmonary embolism) and to reduce the risk of them occurring again.  What do You need to know about Eliquis ? The starting dose is 10 mg (two 5 mg tablets) taken TWICE daily for the FIRST SEVEN (7) DAYS, then on 05/16/2023, the dose is reduced to ONE 5 mg tablet taken TWICE daily.  Eliquis may be taken with or without food.   Try to take the dose about the same time in the morning and in the evening. If you have difficulty swallowing the tablet whole please discuss with your pharmacist how to take the medication safely.  Take Eliquis exactly as prescribed and DO NOT stop taking Eliquis without talking to the doctor who prescribed the medication.  Stopping may increase your risk of developing a new blood clot.  Refill your prescription before you run out.  After discharge, you should have regular check-up appointments with your healthcare provider that is prescribing your Eliquis.    What do you do if you miss a dose? If a dose of ELIQUIS is not taken at the scheduled time, take it as soon as possible on the same day and twice-daily administration should be resumed. The dose should not be doubled to make up for a missed dose.  Important Safety Information A possible side effect of Eliquis is bleeding. You should call your healthcare provider right away if you experience any of the following: Bleeding from an injury or your nose that does not stop. Unusual colored urine (red or dark brown) or unusual colored stools (red or black). Unusual bruising for unknown reasons. A serious fall or if you hit your head (even if there is no bleeding).  Some medicines may interact with Eliquis and might increase your risk of bleeding or clotting while on Eliquis. To help avoid this, consult your healthcare provider or  pharmacist prior to using any new prescription or non-prescription medications, including herbals, vitamins, non-steroidal anti-inflammatory drugs (NSAIDs) and supplements.  This website has more information on Eliquis (apixaban): http://www.eliquis.com/eliquis/home   Stop Tamoxifen  Because of your blood clot per Dr. Trisha Mangle (Oncologist) in [facility] - Please call oncology office to make a  f/u appointment in about  6 months

## 2023-05-09 NOTE — Progress Notes (Signed)
BLE venous duplex has been completed.  Preliminary results given to Dr. [name] Guitar.   Results can be found under chart review under CV PROC. 05/09/2023 11:04 AM Doha Boling RVT, RDMS

## 2023-05-09 NOTE — Care Management Obs Status (Signed)
MEDICARE OBSERVATION STATUS NOTIFICATION   Patient Details  Name: John [name] MRN: [id number] Date of Birth: 15-Sep-1945   Medicare Observation Status Notification Given:  Yes    Princella Ion, LCSW 05/09/2023, 8:42 AM

## 2023-05-09 NOTE — Progress Notes (Addendum)
Brief neurology note  On personal review of brain MRI from yesterday, what was interpreted by radiology as an acute punctate ischemic infarct in the R middle cerebellar peduncle is actually evolution of recent known infarct and does not require further stroke workup. She will be anticoagulated for PE and does not need antiplatelets in addition to therapeutic [facility] for secondary stroke prevention. If AC is stopped in the future patient should be put back on plavix indefinitely.  Bing Neighbors, MD [facility] [phone]  If 7pm- 7am, please page neurology on call as listed in AMION.

## 2023-05-09 NOTE — Progress Notes (Signed)
PHARMACY - ANTICOAGULATION CONSULT NOTE  Pharmacy Consult for Apixaban Indication:  PE/DVT  Allergies  Allergen Reactions   Ramipril Anaphylaxis   Other Diarrhea    Severe intolerance to Chemotherapy in the past.   Sertraline Other (See Comments)    "Extreme headaches"   Adhesive [Tape] Rash    Patient Measurements: Height: 6' (182.9 cm) Weight: 101.9 kg (224 lb 9.6 oz) IBW/kg (Calculated) : 77.6   Vital Signs: Temp: 98.2 F (36.8 C) (01/27 0846) Temp Source: Oral (01/27 0846) BP: 142/64 (01/27 0846) Pulse Rate: 74 (01/27 0846)  Labs: Recent Labs    05/08/23 1405 05/08/23 1835 05/09/23 0454  HGB 13.7  --  11.0*  HCT 43.5  --  34.3*  PLT 461*  --  381  CREATININE 1.32*  --  0.92  TROPONINIHS  --  9  --     Estimated Creatinine Clearance: 84.3 mL/min (by C-G formula based on SCr of 0.92 mg/dL).   Medical History: Past Medical History:  Diagnosis Date   BPH (benign prostatic hyperplasia)    CTS (carpal tunnel syndrome)    Depression    Diabetes mellitus    Essential hypertension    GERD (gastroesophageal reflux disease)    History of breast cancer    Hypercholesteremia    Neuropathy      Assessment: 58 y/oF with PMH of recent cerebellar stroke, breast cancer recently discharged from inpatient rehab who presents with a fall and syncopal episode. CTa chest + for PE, no right heart strain. BLE venous duplex + for acute deep vein thrombosis involving the left common femoral vein, and SF junction, age indeterminate deep vein thrombosis involving the external iliac vein. Pharmacy consulted for Apixaban dosing for VTE. Per Neurology, does not need antiplatelets in addition to therapeutic AC - aspirin and clopidogrel have been discontinued with start of apixaban. CBC: Hgb 11, Plt 381K. SCr 0.92.     Plan:  Per Dr. Selina Cooley, ok to proceed with usual dosing of Apixaban for VTE, including loading dose phase.  Start Apixaban 10mg  PO BID x 7 days, then 5mg  PO BID  thereafter. Monitor CBC and for s/sx of bleeding. Pharmacy to provide education prior to discharge.     Greer Pickerel, PharmD, BCPS Clinical Pharmacist 05/09/2023,12:17 PM

## 2023-05-10 ENCOUNTER — Observation Stay (HOSPITAL_COMMUNITY): Payer: Medicare Other

## 2023-05-10 DIAGNOSIS — Y92002 Bathroom of unspecified non-institutional (private) residence single-family (private) house as the place of occurrence of the external cause: Secondary | ICD-10-CM | POA: Diagnosis not present

## 2023-05-10 DIAGNOSIS — I82461 Acute embolism and thrombosis of right calf muscular vein: Secondary | ICD-10-CM | POA: Diagnosis not present

## 2023-05-10 DIAGNOSIS — K219 Gastro-esophageal reflux disease without esophagitis: Secondary | ICD-10-CM | POA: Diagnosis present

## 2023-05-10 DIAGNOSIS — Z7901 Long term (current) use of anticoagulants: Secondary | ICD-10-CM | POA: Diagnosis not present

## 2023-05-10 DIAGNOSIS — I2693 Single subsegmental pulmonary embolism without acute cor pulmonale: Secondary | ICD-10-CM

## 2023-05-10 DIAGNOSIS — E1140 Type 2 diabetes mellitus with diabetic neuropathy, unspecified: Secondary | ICD-10-CM | POA: Diagnosis present

## 2023-05-10 DIAGNOSIS — Z7902 Long term (current) use of antithrombotics/antiplatelets: Secondary | ICD-10-CM | POA: Diagnosis not present

## 2023-05-10 DIAGNOSIS — Z9221 Personal history of antineoplastic chemotherapy: Secondary | ICD-10-CM | POA: Diagnosis not present

## 2023-05-10 DIAGNOSIS — I951 Orthostatic hypotension: Secondary | ICD-10-CM | POA: Diagnosis present

## 2023-05-10 DIAGNOSIS — I442 Atrioventricular block, complete: Secondary | ICD-10-CM | POA: Diagnosis present

## 2023-05-10 DIAGNOSIS — I82422 Acute embolism and thrombosis of left iliac vein: Secondary | ICD-10-CM | POA: Diagnosis present

## 2023-05-10 DIAGNOSIS — Z1152 Encounter for screening for COVID-19: Secondary | ICD-10-CM | POA: Diagnosis not present

## 2023-05-10 DIAGNOSIS — R55 Syncope and collapse: Secondary | ICD-10-CM | POA: Diagnosis present

## 2023-05-10 DIAGNOSIS — Y92009 Unspecified place in unspecified non-institutional (private) residence as the place of occurrence of the external cause: Secondary | ICD-10-CM | POA: Diagnosis not present

## 2023-05-10 DIAGNOSIS — K760 Fatty (change of) liver, not elsewhere classified: Secondary | ICD-10-CM | POA: Diagnosis present

## 2023-05-10 DIAGNOSIS — E43 Unspecified severe protein-calorie malnutrition: Secondary | ICD-10-CM | POA: Diagnosis present

## 2023-05-10 DIAGNOSIS — Z794 Long term (current) use of insulin: Secondary | ICD-10-CM | POA: Diagnosis not present

## 2023-05-10 DIAGNOSIS — W182XXA Fall in (into) shower or empty bathtub, initial encounter: Secondary | ICD-10-CM | POA: Diagnosis present

## 2023-05-10 DIAGNOSIS — E7800 Pure hypercholesterolemia, unspecified: Secondary | ICD-10-CM | POA: Diagnosis present

## 2023-05-10 DIAGNOSIS — L89151 Pressure ulcer of sacral region, stage 1: Secondary | ICD-10-CM | POA: Diagnosis not present

## 2023-05-10 DIAGNOSIS — I2510 Atherosclerotic heart disease of native coronary artery without angina pectoris: Secondary | ICD-10-CM | POA: Diagnosis present

## 2023-05-10 DIAGNOSIS — N179 Acute kidney failure, unspecified: Secondary | ICD-10-CM | POA: Diagnosis present

## 2023-05-10 DIAGNOSIS — I700 Atherosclerosis of aorta: Secondary | ICD-10-CM | POA: Diagnosis present

## 2023-05-10 DIAGNOSIS — E669 Obesity, unspecified: Secondary | ICD-10-CM | POA: Diagnosis present

## 2023-05-10 DIAGNOSIS — W19XXXA Unspecified fall, initial encounter: Secondary | ICD-10-CM | POA: Diagnosis not present

## 2023-05-10 DIAGNOSIS — M7989 Other specified soft tissue disorders: Secondary | ICD-10-CM | POA: Diagnosis not present

## 2023-05-10 DIAGNOSIS — N400 Enlarged prostate without lower urinary tract symptoms: Secondary | ICD-10-CM | POA: Diagnosis present

## 2023-05-10 DIAGNOSIS — I82412 Acute embolism and thrombosis of left femoral vein: Secondary | ICD-10-CM | POA: Diagnosis present

## 2023-05-10 DIAGNOSIS — I10 Essential (primary) hypertension: Secondary | ICD-10-CM | POA: Diagnosis present

## 2023-05-10 DIAGNOSIS — Z7984 Long term (current) use of oral hypoglycemic drugs: Secondary | ICD-10-CM | POA: Diagnosis not present

## 2023-05-10 DIAGNOSIS — I2699 Other pulmonary embolism without acute cor pulmonale: Secondary | ICD-10-CM | POA: Diagnosis present

## 2023-05-10 DIAGNOSIS — E1165 Type 2 diabetes mellitus with hyperglycemia: Secondary | ICD-10-CM | POA: Diagnosis present

## 2023-05-10 LAB — ECHOCARDIOGRAM LIMITED
Height: 72 in
Weight: 3593.6 [oz_av]

## 2023-05-10 LAB — CORTISOL: Cortisol, Plasma: 17.2 ug/dL

## 2023-05-10 LAB — CBG MONITORING, ED: Glucose-Capillary: 162 mg/dL — ABNORMAL HIGH (ref 70–99)

## 2023-05-10 LAB — GLUCOSE, CAPILLARY
Glucose-Capillary: 175 mg/dL — ABNORMAL HIGH (ref 70–99)
Glucose-Capillary: 243 mg/dL — ABNORMAL HIGH (ref 70–99)

## 2023-05-10 MED ORDER — GUAIFENESIN-DM 100-10 MG/5ML PO SYRP
5.0000 mL | ORAL_SOLUTION | ORAL | Status: DC | PRN
  Administered 2023-05-10 – 2023-05-14 (×9): 5 mL via ORAL
  Filled 2023-05-10 (×10): qty 5

## 2023-05-10 NOTE — Progress Notes (Signed)
   Inpatient Rehabilitation Admissions Coordinator   Noted OT signing off for patient felt at level as previous discharge from recent CIR admit. Other rehab venues should be pursued.  Ottie Glazier, RN, MSN Rehab Admissions Coordinator [phone] 05/10/2023 2:14 PM

## 2023-05-10 NOTE — Progress Notes (Addendum)
PROGRESS NOTE    John [name]  ZOX:[id number] DOB: 1946/02/18 DOA: 05/08/2023 PCP: Sharlene Dory, DO  Chief Complaint  Patient presents with   Fall   Weakness    Brief Narrative:   78 yo with hx recent cerebellar stroke, breast cancer on tamoxifen, T2DM, neuropathy, vertigo as well as other medical issues here after recent discharge from inpatient rehab (from his cerebellar stroke) with Lorell Thibodaux fall and episode of suspected syncope with hypotension on arrival.   Assessment & Plan:   Principal Problem:   Fall at home, initial encounter Active Problems:   Dizziness and giddiness   Hypotension   Insulin dependent type 2 diabetes mellitus (HCC)   BPH (benign prostatic hyperplasia)   GERD (gastroesophageal reflux disease)   Abnormal LFTs   Anemia  Pulmonary Embolism  DVT  Pulmonary Infarction Most likely provoking factor his recent hospitalization (SCD's only during previous hospital stay, no chem ppx).  He does have Zayvon Alicea history of malignancy and is following with Oncology.  I think he warrants 3-6 months anticoagulation given VTE provoked with hospitalization/immobility related to stroke/illness.  Can probably discontinue after this, but given his history malignancy, would clarify with his outpatient oncologist prior to stopping.   CT with small volume occlusive and nonocclusive PE in RLL pulmonary artery branches.  No right heart strain.   eliquis Limited echo Will hold antiplatelet therapy while he's on anticoagulation - once he's done with eliquis course, will transition to plavix alone per discussion with neurology  Syncope  Orthostatic Hypotension (more likely) vs Reflex Syncope  Fall Was on toilet and couldn't get up, just as his wife got him up (took  2-3 attempts), his legs gave out (she thinks he passed out - he laid in the shower for Daimien Patmon minute or so) (into the shower) Ddx for his fall includes syncope related to orthostatic hypotension (given low BP's on arrival)  or vasovagal syncope (micturition syncope?).  He has Quasean Frye PE, but I don't think this was likely the cause of his presentation given small volume and no R heart strain on CT.  His echo from 04/2023 -> EF 04/2023, grade 1 diastolic, no AS -> repeat echo pending given PE Head CT without acute intracranial abnormality C spine without acute fx or subluxation MRI with punctate acute infarct in the right middle cerebellar peduncle (discussed with neurology, this is the stroke from 1/7) D dimer elevated, follow CT PE protocol and LE [place] Normal TSH from 03/2023.   Check AM cortisol  Check daily orthostatics (+ orthostatics with PT yesterday), hold amlodipine, hold lasix (notably is on flomax, will continue cautiously for now) Start with thigh high compression stockings (with acute DVT, will hold off on compression to LLE until he's had Tim Corriher few days of eliquis/anticoagulation - add abdominal binder) PT/OT  Cerebellar Stroke Recently discharged from rehab for this Continue statin. As above, MRI demonstrates the stroke diagnosed earlier this month, no need for new neurology consult at this time.  Given his need for anticoagulation, plan at this point is for eliquis alone, after he completes his course of eliquis will need to transition to plavix.    Poor PO intake RD consult  Type 2 Diabetes A1c 12.4 in Dec 2024 Apparently on 45 units toujeo BID at home with 25 units lispo once daily in AM? (Metformin as well) Will start basal back at low dose and monitor as BG's ok right now (notably on Fermina Mishkin fraction of his home insulin regimen with BG's generally  appropriate today -> would consider dose reduction at discharge)   Breast Cancer Tamoxifen on hold per pharmacy, needs to follow up with oncology -> consider discussion with oncology here?  Hypertension Holding amlodipine, lasix with concern for orthostasis above  BPH Flomax, proscar  GERD PPI   Abnormal LFT's  Hepatic Steatosis Mild, abnormal GGT Consider  further w/u outpatient - suspect this is related to known fatty liver disease  Obesity Body mass index is 30.46 kg/m.    DVT prophylaxis: SCD Code Status: full Family Communication: none Disposition:   Status is: Observation The patient remains OBS appropriate and will d/c before 2 midnights.   Consultants:  neurology  Procedures:  none  Antimicrobials:  Anti-infectives (From admission, onward)    None       Subjective: No new complaints this morning Discussed his PE diagnosis and anticoagulation Frustration overnight, his call button didn't work and he  didn't get help for 3 hrs  Objective: Vitals:   05/10/23 0200 05/10/23 0500 05/10/23 0508 05/10/23 0800  BP: 139/74 (!) 157/68  (!) 151/69  Pulse: 69 76  76  Resp: 18 16  20   Temp:   98 F (36.7 C)   TempSrc:   Oral   SpO2: 95% 96%  96%  Weight:      Height:        Intake/Output Summary (Last 24 hours) at 05/10/2023 0841 Last data filed at 05/09/2023 1500 Gross per 24 hour  Intake 1010.84 ml  Output --  Net 1010.84 ml   Filed Weights   05/09/23 1114  Weight: 101.9 kg    Examination:  General: No acute distress. Cardiovascular: RRR Lungs: unlabored Abdomen: Soft, nontender, nondistended  Neurological: Alert and oriented 3. Moves all extremities. Cranial nerves II through XII grossly intact. Extremities: bilateral LE mild edema  Data Reviewed: I have personally reviewed following labs and imaging studies  CBC: Recent Labs  Lab 05/08/23 1405 05/09/23 0454  WBC 10.4 8.1  NEUTROABS 8.5*  --   HGB 13.7 11.0*  HCT 43.5 34.3*  MCV 85.6 84.5  PLT 461* 381    Basic Metabolic Panel: Recent Labs  Lab 05/08/23 1405 05/09/23 0454  NA 138 137  K 4.6 3.8  CL 100 102  CO2 22 24  GLUCOSE 123* 116*  BUN 13 14  CREATININE 1.32* 0.92  CALCIUM 9.9 8.7*    GFR: Estimated Creatinine Clearance: 84.3 mL/min (by C-G formula based on SCr of 0.92 mg/dL).  Liver Function Tests: Recent Labs   Lab 05/08/23 1405 05/09/23 0454  AST 49* 42*  ALT 47* 45*  ALKPHOS 59 42  BILITOT 0.9 1.0  PROT 7.4 5.5*  ALBUMIN 3.6 2.9*    CBG: Recent Labs  Lab 05/06/23 2119 05/07/23 0546 05/09/23 1215 05/09/23 1641 05/09/23 2115  GLUCAP 141* 132* 170* 153* 136*     Recent Results (from the past 240 hours)  Resp panel by RT-PCR (RSV, Flu Timiko Offutt&B, Covid)     Status: None   Collection Time: 05/08/23  6:35 PM   Specimen: Nasal Swab  Result Value Ref Range Status   SARS Coronavirus 2 by RT PCR NEGATIVE NEGATIVE Final    Comment: (NOTE) SARS-CoV-2 target nucleic acids are NOT DETECTED.  The SARS-CoV-2 RNA is generally detectable in upper respiratory specimens during the acute phase of infection. The lowest concentration of SARS-CoV-2 viral copies this assay can detect is 138 copies/mL. John [name] negative result does not preclude SARS-Cov-2 infection and should not be used as the sole  basis for treatment or other patient management decisions. Dannelle Rhymes negative result may occur with  improper specimen collection/handling, submission of specimen other than nasopharyngeal swab, presence of viral mutation(s) within the areas targeted by this assay, and inadequate number of viral copies(<138 copies/mL). Alexiya Franqui negative result must be combined with clinical observations, patient history, and epidemiological information. The expected result is Negative.  Fact Sheet for Patients:  BloggerCourse.com  Fact Sheet for Healthcare Providers:  SeriousBroker.it  This test is no t yet approved or cleared by the [place] FDA and  has been authorized for detection and/or diagnosis of SARS-CoV-2 by FDA under an Emergency Use Authorization (EUA). This EUA will remain  in effect (meaning this test can be used) for the duration of the COVID-19 declaration under Section 564(b)(1) of the Act, 21 U.S.C.section 360bbb-3(b)(1), unless the authorization is terminated  or  revoked sooner.       Influenza Cincere Deprey by PCR NEGATIVE NEGATIVE Final   Influenza B by PCR NEGATIVE NEGATIVE Final    Comment: (NOTE) The Xpert Xpress SARS-CoV-2/FLU/RSV plus assay is intended as an aid in the diagnosis of influenza from Nasopharyngeal swab specimens and should not be used as Lotoya Casella sole basis for treatment. Nasal washings and aspirates are unacceptable for Xpert Xpress SARS-CoV-2/FLU/RSV testing.  Fact Sheet for Patients: BloggerCourse.com  Fact Sheet for Healthcare Providers: SeriousBroker.it  This test is not yet approved or cleared by the [place] FDA and has been authorized for detection and/or diagnosis of SARS-CoV-2 by FDA under an Emergency Use Authorization (EUA). This EUA will remain in effect (meaning this test can be used) for the duration of the COVID-19 declaration under Section 564(b)(1) of the Act, 21 U.S.C. section 360bbb-3(b)(1), unless the authorization is terminated or revoked.     Resp Syncytial Virus by PCR NEGATIVE NEGATIVE Final    Comment: (NOTE) Fact Sheet for Patients: BloggerCourse.com  Fact Sheet for Healthcare Providers: SeriousBroker.it  This test is not yet approved or cleared by the [place] FDA and has been authorized for detection and/or diagnosis of SARS-CoV-2 by FDA under an Emergency Use Authorization (EUA). This EUA will remain in effect (meaning this test can be used) for the duration of the COVID-19 declaration under Section 564(b)(1) of the Act, 21 U.S.C. section 360bbb-3(b)(1), unless the authorization is terminated or revoked.  Performed at [facility], [address], [place], [place] [address]          Radiology Studies: VAS [place] LOWER EXTREMITY VENOUS (DVT) Result Date: 05/09/2023  Lower Venous DVT Study Patient Name:  John [name]  Date of Exam:   05/09/2023 Medical Rec #:  [id number]         Accession #:    [id number] Date of Birth: 1945/09/12         Patient Gender: M Patient Age:   34 years Exam Location:  [facility] Procedure:      VAS [place] LOWER EXTREMITY VENOUS (DVT) Referring Phys: Saliha Salts POWELL JR --------------------------------------------------------------------------------  Indications: Edema.  Risk Factors: Hx of breast cancer on tamoxifen. Comparison Study: No previous exams Performing Technologist: Jody Hill RVT, RDMS  Examination Guidelines: Deland Slocumb complete evaluation includes B-mode imaging, spectral Doppler, color Doppler, and power Doppler as needed of all accessible portions of each vessel. Bilateral testing is considered an integral part of Clarece Drzewiecki complete examination. Limited examinations for reoccurring indications may be performed as noted. The reflux portion of the exam is performed with the patient in reverse Trendelenburg.  +---------+---------------+---------+-----------+----------+--------------+ RIGHT  CompressibilityPhasicitySpontaneityPropertiesThrombus Aging +---------+---------------+---------+-----------+----------+--------------+ CFV      Full           Yes      Yes                                 +---------+---------------+---------+-----------+----------+--------------+ SFJ      Full                                                        +---------+---------------+---------+-----------+----------+--------------+ FV Prox  Full           Yes      Yes                                 +---------+---------------+---------+-----------+----------+--------------+ FV Mid   Full           Yes      Yes                                 +---------+---------------+---------+-----------+----------+--------------+ FV DistalFull           Yes      Yes                                 +---------+---------------+---------+-----------+----------+--------------+ PFV      Full                                                         +---------+---------------+---------+-----------+----------+--------------+ POP      Full           Yes      Yes                                 +---------+---------------+---------+-----------+----------+--------------+ PTV      Full                                                        +---------+---------------+---------+-----------+----------+--------------+ PERO     Full                                                        +---------+---------------+---------+-----------+----------+--------------+   +---------+----------------+---------+-----------+----------+-----------------+ LEFT     Compressibility PhasicitySpontaneityPropertiesThrombus Aging    +---------+----------------+---------+-----------+----------+-----------------+ CFV      Partial         Yes      Yes                  Acute             +---------+----------------+---------+-----------+----------+-----------------+ SFJ  Partial                                       Acute             +---------+----------------+---------+-----------+----------+-----------------+ FV Prox  Full            Yes      Yes                                    +---------+----------------+---------+-----------+----------+-----------------+ FV Mid   Full            No       Yes                                    +---------+----------------+---------+-----------+----------+-----------------+ FV DistalFull            Yes      Yes                                    +---------+----------------+---------+-----------+----------+-----------------+ PFV      Full                                                            +---------+----------------+---------+-----------+----------+-----------------+ POP      Full            Yes      Yes                                    +---------+----------------+---------+-----------+----------+-----------------+ PTV      Full                                                             +---------+----------------+---------+-----------+----------+-----------------+ PERO     Full                                                            +---------+----------------+---------+-----------+----------+-----------------+ EIV      Partial (distal)Yes      Yes                  Age Indeterminate +---------+----------------+---------+-----------+----------+-----------------+ Unable to visualized CIV due to habitus / bowel gas    Summary: BILATERAL: -No evidence of popliteal cyst, bilaterally. RIGHT: - There is no evidence of deep vein thrombosis in the lower extremity.  LEFT: - Findings consistent with acute deep vein thrombosis involving the left common femoral vein, and SF junction.  - Findings consistent with age indeterminate deep vein thrombosis involving the external iliac vein.   *See table(s) above for measurements and  observations. Electronically signed by Gerarda Fraction on 05/09/2023 at 5:03:50 PM.    Final    CT Angio Chest Pulmonary Embolism (PE) W or WO Contrast Addendum Date: 05/09/2023 ADDENDUM REPORT: 05/09/2023 09:49 ADDENDUM: Critical Value/emergent results were called by telephone at the time of interpretation on 05/09/2023 at 9:49 am to Dr. Rose Phi , who verbally acknowledged these results. Electronically Signed   By: Jules Schick M.D.   On: 05/09/2023 09:49   Result Date: 05/09/2023 CLINICAL DATA:  Pulmonary embolism (PE) suspected, low to intermediate prob, positive D-dimer. Weakness. Fall. EXAM: CT ANGIOGRAPHY CHEST WITH CONTRAST TECHNIQUE: Multidetector CT imaging of the chest was performed using the standard protocol during bolus administration of intravenous contrast. Multiplanar CT image reconstructions and MIPs were obtained to evaluate the vascular anatomy. RADIATION DOSE REDUCTION: This exam was performed according to the departmental dose-optimization program which includes automated exposure control, adjustment of the mA and/or kV  according to patient size and/or use of iterative reconstruction technique. CONTRAST:  75mL OMNIPAQUE IOHEXOL 350 MG/ML SOLN COMPARISON:  CT scan chest from 08/29/2020. FINDINGS: Cardiovascular: There is small volume pulmonary embolism. There is nonocclusive pulmonary embolism in the right lower lobe lobar artery and occlusive embolism in the right lower lobe segmental pulmonary arteries. No right heart strain. There are segmental heterogeneous opacities in the right lung lower lobe which enhances less than other lung parenchyma and is compatible with lung infarction. Normal cardiac size. No pericardial effusion. No aortic aneurysm. There are coronary artery calcifications, in keeping with coronary artery disease. There are also mild peripheral atherosclerotic vascular calcifications of thoracic aorta and its major branches. Mediastinum/Nodes: Visualized thyroid gland appears grossly unremarkable. No solid / cystic mediastinal masses. The esophagus is nondistended precluding optimal assessment. No axillary, mediastinal or hilar lymphadenopathy by size criteria. Lungs/Pleura: The central tracheo-bronchial tree is patent. There is new small right pleural effusion with associated compressive atelectatic changes in the right lower lobe. There are additional subsegmental atelectatic changes in the right upper lobe and left lung lower lobe. No lung mass or pneumothorax. No suspicious lung nodules. No left pleural effusion. Redemonstration of peripheral/subpleural opacity with associated linear areas of scarring/atelectasis in the right lung upper lobe, anteriorly, nonspecific but commonly seen in patients with prior radiation. Upper Abdomen: Small volume cholelithiasis without acute cholecystitis. There are scattered calcified granulomas in the spleen. Visualized upper abdominal viscera within normal limits. Musculoskeletal: The visualized soft tissues of the chest wall are grossly unremarkable. No suspicious osseous  lesions. There are mild multilevel degenerative changes in the visualized spine. Review of the MIP images confirms the above findings. IMPRESSION: 1. There is small volume occlusive and nonocclusive pulmonary embolism in the right lung lower lobe pulmonary artery branches, as described in detail above. No right heart strain. 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 2. There is small right pleural effusion and additional areas of subsegmental atelectasis in the right upper lobe and left lung lower lobe. 3. Multiple other nonacute observations, as described above. Aortic Atherosclerosis (ICD10-I70.0). Electronically Signed: By: Jules Schick M.D. On: 05/09/2023 09:39   CT CERVICAL SPINE WO CONTRAST Result Date: 05/09/2023 CLINICAL DATA:  Neck trauma.  Mechanical fall EXAM: CT CERVICAL SPINE WITHOUT CONTRAST TECHNIQUE: Multidetector CT imaging of the cervical spine was performed without intravenous contrast. Multiplanar CT image reconstructions were also generated. RADIATION DOSE REDUCTION: This exam was performed according to the departmental dose-optimization program which includes automated exposure control, adjustment of the mA and/or kV according to  patient size and/or use of iterative reconstruction technique. COMPARISON:  MRI the cervical spine 10/23/2015. FINDINGS: Alignment: No significant listhesis is present. Straightening of the normal cervical lordosis is again seen. Skull base and vertebrae: The craniocervical junction is normal. Vertebral body heights are normal. No acute fractures are present. Soft tissues and spinal canal: No prevertebral fluid or swelling. No visible canal hematoma. Disc levels: Mild degenerative changes are present in the lower cervical spine. No focal stenosis is present. Upper chest: The lung apices are clear. The thoracic inlet is within normal limits. IMPRESSION: 1. No acute fracture or traumatic subluxation. 2. Mild degenerative changes in the  lower cervical spine without focal stenosis. Electronically Signed   By: Marin Roberts M.D.   On: 05/09/2023 09:43   MR BRAIN WO CONTRAST Result Date: 05/09/2023 CLINICAL DATA:  Syncope/presyncope, cerebrovascular cause suspected EXAM: MRI HEAD WITHOUT CONTRAST TECHNIQUE: Multiplanar, multiecho pulse sequences of the brain and surrounding structures were obtained without intravenous contrast. COMPARISON:  CT head 05/08/2023. FINDINGS: Brain: Punctate acute infarct in the right middle cerebellar peduncle. Moderate patchy T2/FLAIR hyperintensities in the white matter nonspecific but compatible with chronic microvascular ischemic disease. Cerebral atrophy. No evidence of acute hemorrhage, mass lesion or midline shift. Remote infarcts in bilateral thalami in the pons. Vascular: Major arterial flow voids are maintained Skull and upper cervical spine: Normal marrow signal. Sinuses/Orbits: Paranasal sinus mucosal thickening. No acute orbital findings. Other: No mastoid effusions. IMPRESSION: 1. Punctate acute infarct in the right middle cerebellar peduncle. 2. 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 Electronically Signed   By: Feliberto Harts M.D.   On: 05/09/2023 00:31   CT Head Wo Contrast Result Date: 05/08/2023 CLINICAL DATA:  Head trauma, minor.  Weakness.  Fall. EXAM: CT HEAD WITHOUT CONTRAST TECHNIQUE: Contiguous axial images were obtained from the base of the skull through the vertex without intravenous contrast. RADIATION DOSE REDUCTION: This exam was performed according to the departmental dose-optimization program which includes automated exposure control, adjustment of the mA and/or kV according to patient size and/or use of iterative reconstruction technique. COMPARISON:  CT head without contrast 04/20/2023 FINDINGS: Brain: Moderate atrophy and white matter disease is stable. Remote lacunar infarcts are again noted within the thalami bilaterally. Jamelle Goldston remote lacunar infarct is  present in the inferior left caudate head. No acute cortical infarct or significant interval change is present. The ventricles are proportionate to the degree of atrophy. No significant extraaxial fluid collection is present. No acute hemorrhage or mass lesion is present. Midline structures are within normal limits. The brainstem and cerebellum are within normal limits. Vascular: Atherosclerotic calcifications are present within the cavernous internal carotid arteries bilaterally. No hyperdense vessel is present. Skull: Calvarium is intact. No focal lytic or blastic lesions are present. No significant extracranial soft tissue lesion is present. Sinuses/Orbits: Minimal fluid is present in the maxillary sinuses bilaterally. Scattered ethmoid opacification is present. The paranasal sinuses and mastoid air cells are otherwise clear. Bilateral lens replacements are noted. Globes and orbits are otherwise unremarkable. IMPRESSION: 1. No acute intracranial abnormality or significant interval change. 2. Stable atrophy and white matter disease likely reflects the sequela of chronic microvascular ischemia. 3. Remote lacunar infarcts of the thalami bilaterally and inferior left caudate head. 4. Minimal bilateral maxillary and ethmoid sinus disease. Electronically Signed   By: Marin Roberts M.D.   On: 05/08/2023 15:35   DG Chest 2 View Result Date: 05/08/2023 CLINICAL DATA:  Dizziness and fall EXAM: CHEST - 2 VIEW COMPARISON:  Chest radiograph dated 04/28/2023 FINDINGS: Patient is rotated to the right. Low lung volumes with bronchovascular crowding. Right mid lung linear and patchy opacity. Left lower lung patchy opacity. Small right pleural effusion. No pneumothorax. The heart size and mediastinal contours are within normal limits. No radiographic finding of acute displaced fracture. IMPRESSION: 1. Low lung volumes with bronchovascular crowding. Right mid lung linear and patchy opacity and left lower lung patchy  opacity may reflect atelectasis, aspiration, or pneumonia. 2. Small right pleural effusion. 3.  No radiographic finding of acute displaced fracture. Electronically Signed   By: Agustin Cree M.D.   On: 05/08/2023 14:19        Scheduled Meds:  apixaban  10 mg Oral BID   Followed by   Melene Muller ON 05/16/2023] apixaban  5 mg Oral BID   atorvastatin  80 mg Oral QHS   buPROPion  75 mg Oral BID   finasteride  5 mg Oral Daily   insulin aspart  0-5 Units Subcutaneous QHS   insulin aspart  0-9 Units Subcutaneous TID WC   insulin glargine-yfgn  10 Units Subcutaneous Daily   pantoprazole  40 mg Oral Daily   polyethylene glycol  17 g Oral BID   sodium chloride flush  3 mL Intravenous Q12H   tamoxifen  20 mg Oral Daily   tamsulosin  0.4 mg Oral Daily   Continuous Infusions:     LOS: 0 days    Time spent: over 30 min    Lacretia Nicks, MD [facility]   To contact the attending provider between 7A-7P or the covering provider during after hours 7P-7A, please log into the web site [email or web address] and access using universal [place] password for that web site. If you do not have the password, please call the hospital operator.  05/10/2023, 8:41 AM

## 2023-05-10 NOTE — Evaluation (Signed)
Occupational Therapy Evaluation Patient Details Name: John [name] MRN: [id number] DOB: 1946/02/11 Today's Date: 05/10/2023   History of Present Illness 78 yo male presents to therapy from ED on 05/08/2023 due to fall in home setting. Pt recently d/c from rehab at [facility] following CVA. Imaging no acute fx, opacity on CXR and infarcts on MRI with no apparent change from 04/19/2023. Pt PMH includes but is not limited to: hypotension, GERD, anemia, BPH, GERD, falls, vertigo, CVA and breast ca.   Clinical Impression   Pt admitted with the above diagnosis/issues. Pt currently with functional limitations due to the deficits listed below (see OT Problem List). Pt recently discharged home from CIR with wife at SBA/CGA level. Pt is at or near this level during evaluation. Recommend discharge home with wife providing same level of assistance with home health OT services (Pt reports that therapy services were not started yet since his discharge). Pt education provided regarding BP management with use of TED hose and abdominal binder. Pt reports that he wore TED hose on rehab but has not since he was discharged. Communication to MD and RN performed to request TED hose. MD requesting on donning on RLE at this time due to acute LLE DVT. No further acute OT needs at this time. Will sign off.         If plan is discharge home, recommend the following: A little help with walking and/or transfers;A little help with bathing/dressing/bathroom;Help with stairs or ramp for entrance;Assistance with cooking/housework;Assist for transportation    Functional Status Assessment  Patient has had a recent decline in their functional status and demonstrates the ability to make significant improvements in function in a reasonable and predictable amount of time.  Equipment Recommendations  None recommended by OT       Precautions / Restrictions Precautions Precautions: Fall;Other (comment) Precaution Comments: Chronic  vertigo; HOH; monitor for orthostatic hypotension(MD order for thigh TEDs; place on RLE right now d/t LLE DVT. Abdominal binder when up) Restrictions Weight Bearing Restrictions Per Provider Order: No      Mobility Bed Mobility Overal bed mobility: Needs Assistance Bed Mobility: Supine to Sit, Sit to Supine     Supine to sit: Supervision, HOB elevated Sit to supine: Supervision, HOB elevated        Transfers Overall transfer level: Needs assistance Equipment used: None Transfers: Sit to/from Stand Sit to Stand: Contact guard assist, From elevated surface           General transfer comment: No RW available for use. pt able to stand at bedside and perform several lateral side steps towards left      Balance Overall balance assessment: Mild deficits observed, not formally tested        ADL either performed or assessed with clinical judgement   ADL   Eating/Feeding: Independent;Bed level       Upper Body Bathing: Set up;Supervision/ safety   Lower Body Bathing: Minimal assistance;Sit to/from stand Lower Body Bathing Details (indicate cue type and reason): Increased assist provided due to incontinence of urine and assist needed for change gown and bed linen. Upper Body Dressing : Set up;Standing   Lower Body Dressing: Minimal assistance;Bed level   Toilet Transfer: Contact guard assist   Toileting- Clothing Manipulation and Hygiene: Minimal assistance         General ADL Comments: Increased physical assist provided due to incontinence and for safety.     Vision Baseline Vision/History: 1 Wears glasses Ability to See in Adequate Light: 1  Impaired Patient Visual Report: No change from baseline Vision Assessment?: No apparent visual deficits     Perception Perception: Within Functional Limits       Praxis Praxis: WFL       Pertinent Vitals/Pain Pain Assessment Pain Assessment:  (Pt reports chronic pain although did not specify location or  number) Pain Intervention(s): Monitored during session     Extremity/Trunk Assessment Upper Extremity Assessment Upper Extremity Assessment: Overall WFL for tasks assessed   Lower Extremity Assessment Lower Extremity Assessment: Overall WFL for tasks assessed   Cervical / Trunk Assessment Cervical / Trunk Assessment: Other exceptions Cervical / Trunk Exceptions: forward head, rounded shoulders   Communication Communication Communication: Hearing impairment   Cognition Arousal: Alert Behavior During Therapy: Flat affect Overall Cognitive Status: Within Functional Limits for tasks assessed    General Comments: Very HOH. Left ear hears better per patient.     General Comments  See flowsheet for orthostatic BP. Pt asymptomatic. Nursing and MD aware. MD placed order after evaluation for thigh high TEDs and abdominal binder for BP management. Pt education provided on use with pt verbalizing understanding.            Home Living Family/patient expects to be discharged to:: Private residence Living Arrangements: Spouse/significant other Available Help at Discharge: Family;Available 24 hours/day Type of Home: House Home Access: Stairs to enter [employer] of Steps: 3 Entrance Stairs-Rails: Right;Left;Can reach both Home Layout: Two level;Able to live on main level with bedroom/bathroom Alternate Level Stairs-Number of Steps: 14 Alternate Level Stairs-Rails: Right;Left;Can reach both Bathroom Shower/Tub: Walk-in shower;Curtain   Bathroom Toilet: Handicapped height Bathroom Accessibility: No   Home Equipment: Agricultural consultant (2 wheels);BSC/3in1          Prior Functioning/Environment Prior Level of Function : Needs assist       Physical Assist : ADLs (physical);Mobility (physical) Mobility (physical): Gait;Transfers ADLs (physical): Grooming;Bathing;Dressing;Toileting Mobility Comments: Pt discharged from CIR at CGA/SBA level for transfers and ambulation with  RW. ADLs Comments: Pt discharged from CIR at CGA/SBA for BADL tasks.        OT Problem List: Decreased strength;Impaired balance (sitting and/or standing);Decreased activity tolerance;Decreased coordination;Decreased knowledge of use of DME or AE      OT Treatment/Interventions: Self-care/ADL training;DME and/or AE instruction;Therapeutic activities;Balance training;Patient/family education;Therapeutic exercise    OT Goals(Current goals can be found in the care plan section) Acute Rehab OT Goals Patient Stated Goal: none stated OT Goal Formulation: All assessment and education complete, DC therapy  OT Frequency: Min 1X/week       AM-PAC OT "6 Clicks" Daily Activity     Outcome Measure Help from another person eating meals?: None Help from another person taking care of personal grooming?: None Help from another person toileting, which includes using toliet, bedpan, or urinal?: Total Help from another person bathing (including washing, rinsing, drying)?: A Lot Help from another person to put on and taking off regular upper body clothing?: None Help from another person to put on and taking off regular lower body clothing?: A Little 6 Click Score: 18   End of Session Equipment Utilized During Treatment: Gait belt;Rolling walker (2 wheels) Nurse Communication: Mobility status;Other (comment) (BP and need for condom cath replacement)  Activity Tolerance: Patient tolerated treatment well Patient left: in bed;with call bell/phone within reach  OT Visit Diagnosis: Other abnormalities of gait and mobility (R26.89);Muscle weakness (generalized) (M62.81);BPPV                Time: 0981-1914 OT Time Calculation (  min): 45 min Charges:  OT General Charges $OT Visit: 1 Visit OT Evaluation $OT Eval High Complexity: 1 High OT Treatments $Self Care/Home Management : 8-22 mins $Therapeutic Activity: 8-22 mins  Limmie Patricia, OTR/L,CBIS  Supplemental OT - [facility] and [facility] Secure Chat Preferred     Velton Roselle, Charisse March 05/10/2023, 12:26 PM

## 2023-05-11 ENCOUNTER — Encounter (HOSPITAL_COMMUNITY)
Admission: EM | Disposition: A | Discharge status: Skilled Nursing Facility | Payer: Self-pay | Source: Home / Self Care | Attending: Family Medicine

## 2023-05-11 ENCOUNTER — Encounter (HOSPITAL_COMMUNITY): Payer: Self-pay | Admitting: Cardiology

## 2023-05-11 DIAGNOSIS — I82461 Acute embolism and thrombosis of right calf muscular vein: Secondary | ICD-10-CM | POA: Diagnosis not present

## 2023-05-11 DIAGNOSIS — R55 Syncope and collapse: Secondary | ICD-10-CM

## 2023-05-11 DIAGNOSIS — I2699 Other pulmonary embolism without acute cor pulmonale: Secondary | ICD-10-CM

## 2023-05-11 DIAGNOSIS — I442 Atrioventricular block, complete: Secondary | ICD-10-CM

## 2023-05-11 HISTORY — PX: TEMPORARY PACEMAKER: CATH118268

## 2023-05-11 LAB — CBC WITH DIFFERENTIAL/PLATELET
Abs Immature Granulocytes: 0.08 10*3/uL — ABNORMAL HIGH (ref 0.00–0.07)
Basophils Absolute: 0.1 10*3/uL (ref 0.0–0.1)
Basophils Relative: 1 %
Eosinophils Absolute: 0.2 10*3/uL (ref 0.0–0.5)
Eosinophils Relative: 2 %
HCT: 37.3 % — ABNORMAL LOW (ref 39.0–52.0)
Hemoglobin: 11.9 g/dL — ABNORMAL LOW (ref 13.0–17.0)
Immature Granulocytes: 1 %
Lymphocytes Relative: 15 %
Lymphs Abs: 1.4 10*3/uL (ref 0.7–4.0)
MCH: 27 pg (ref 26.0–34.0)
MCHC: 31.9 g/dL (ref 30.0–36.0)
MCV: 84.8 fL (ref 80.0–100.0)
Monocytes Absolute: 0.7 10*3/uL (ref 0.1–1.0)
Monocytes Relative: 7 %
Neutro Abs: 7.4 10*3/uL (ref 1.7–7.7)
Neutrophils Relative %: 74 %
Platelets: 380 10*3/uL (ref 150–400)
RBC: 4.4 MIL/uL (ref 4.22–5.81)
RDW: 16 % — ABNORMAL HIGH (ref 11.5–15.5)
WBC: 9.8 10*3/uL (ref 4.0–10.5)
nRBC: 0 % (ref 0.0–0.2)

## 2023-05-11 LAB — COMPREHENSIVE METABOLIC PANEL
ALT: 73 U/L — ABNORMAL HIGH (ref 0–44)
AST: 69 U/L — ABNORMAL HIGH (ref 15–41)
Albumin: 3.2 g/dL — ABNORMAL LOW (ref 3.5–5.0)
Alkaline Phosphatase: 46 U/L (ref 38–126)
Anion gap: 13 (ref 5–15)
BUN: 11 mg/dL (ref 8–23)
CO2: 23 mmol/L (ref 22–32)
Calcium: 9.2 mg/dL (ref 8.9–10.3)
Chloride: 100 mmol/L (ref 98–111)
Creatinine, Ser: 0.88 mg/dL (ref 0.61–1.24)
GFR, Estimated: >60 mL/min (ref >60)
Glucose, Bld: 180 mg/dL — ABNORMAL HIGH (ref 70–99)
Potassium: 4 mmol/L (ref 3.5–5.1)
Sodium: 136 mmol/L (ref 135–145)
Total Bilirubin: 1.3 mg/dL — ABNORMAL HIGH (ref 0.0–1.2)
Total Protein: 6.2 g/dL — ABNORMAL LOW (ref 6.5–8.1)

## 2023-05-11 LAB — GLUCOSE, CAPILLARY
Glucose-Capillary: 180 mg/dL — ABNORMAL HIGH (ref 70–99)
Glucose-Capillary: 254 mg/dL — ABNORMAL HIGH (ref 70–99)
Glucose-Capillary: 192 mg/dL — ABNORMAL HIGH (ref 70–99)

## 2023-05-11 LAB — PHOSPHORUS: Phosphorus: 2.7 mg/dL (ref 2.5–4.6)

## 2023-05-11 LAB — MAGNESIUM: Magnesium: 1.6 mg/dL — ABNORMAL LOW (ref 1.7–2.4)

## 2023-05-11 SURGERY — TEMPORARY PACEMAKER
Anesthesia: LOCAL

## 2023-05-11 MED ORDER — ONDANSETRON HCL 4 MG/2ML IJ SOLN
INTRAMUSCULAR | Status: AC
  Filled 2023-05-11: qty 2

## 2023-05-11 MED ORDER — HEPARIN (PORCINE) 25000 UT/250ML-% IV SOLN
1600.0000 [IU]/h | INTRAVENOUS | Status: AC
  Administered 2023-05-11: 1500 [IU]/h via INTRAVENOUS
  Administered 2023-05-12: 1300 [IU]/h via INTRAVENOUS
  Filled 2023-05-11 (×2): qty 250

## 2023-05-11 MED ORDER — LIDOCAINE HCL (PF) 1 % IJ SOLN
INTRAMUSCULAR | Status: DC | PRN
  Administered 2023-05-11: 5 mL via INTRADERMAL

## 2023-05-11 MED ORDER — ONDANSETRON HCL 4 MG/2ML IJ SOLN
INTRAMUSCULAR | Status: DC | PRN
  Administered 2023-05-11: 4 mg via INTRAVENOUS

## 2023-05-11 MED ORDER — METFORMIN HCL ER 500 MG PO TB24
1000.0000 mg | ORAL_TABLET | Freq: Two times a day (BID) | ORAL | Status: DC
  Administered 2023-05-11 – 2023-05-12 (×3): 1000 mg via ORAL
  Filled 2023-05-11 (×5): qty 2

## 2023-05-11 MED ORDER — ATROPINE SULFATE 1 MG/10ML IJ SOSY
PREFILLED_SYRINGE | INTRAMUSCULAR | Status: AC
  Filled 2023-05-11: qty 10

## 2023-05-11 MED ORDER — ADULT MULTIVITAMIN W/MINERALS CH
1.0000 | ORAL_TABLET | Freq: Every day | ORAL | Status: DC
  Administered 2023-05-12 – 2023-05-14 (×3): 1 via ORAL
  Filled 2023-05-11 (×3): qty 1

## 2023-05-11 MED ORDER — DOPAMINE-DEXTROSE 3.2-5 MG/ML-% IV SOLN
0.0000 ug/kg/min | INTRAVENOUS | Status: DC
  Administered 2023-05-11: 10 ug/kg/min via INTRAVENOUS
  Filled 2023-05-11: qty 250

## 2023-05-11 MED ORDER — ENSURE ENLIVE PO LIQD
237.0000 mL | Freq: Two times a day (BID) | ORAL | Status: DC
  Administered 2023-05-12 – 2023-05-14 (×2): 237 mL via ORAL
  Filled 2023-05-11: qty 237

## 2023-05-11 MED ORDER — CHLORHEXIDINE GLUCONATE CLOTH 2 % EX PADS
6.0000 | MEDICATED_PAD | Freq: Every day | CUTANEOUS | Status: DC
  Administered 2023-05-12 – 2023-05-14 (×4): 6 via TOPICAL

## 2023-05-11 SURGICAL SUPPLY — 6 items
CABLE ADAPT PACING TEMP 12FT (ADAPTER) IMPLANT
PACK CARDIAC CATHETERIZATION (CUSTOM PROCEDURE TRAY) IMPLANT
SHEATH PINNACLE 6F 10CM (SHEATH) IMPLANT
SHEATH PROBE COVER 6X72 (BAG) IMPLANT
WIRE MICRO SET SILHO 5FR 7 (SHEATH) IMPLANT
WIRE PACING TEMP ST TIP 5 (CATHETERS) IMPLANT

## 2023-05-11 NOTE — Significant Event (Incomplete)
Rapid Response Event Note   Reason for Call : Bradycardia heart rate in the 30s   Initial Focused Assessment:  Patient is alert and oriented x 4   See Vital signs flowsheet.    Interventions:  ECG  with stable wide complex rhythm, rate 29-30bpm consistent with idioventricular escape rhythm. Start Dopamine @ 10 mcg  Plan of Care:  Transfer to [facility] Cath Lab    Event Summary:   MD Notified: Dr. Glennon Hamilton  Call Time: 1300  Arrival Time:  1305  End Time: 1430   Sharyn Lull Jazalynn Mireles, RN

## 2023-05-11 NOTE — Plan of Care (Signed)
  Problem: Education: Goal: Ability to describe self-care measures that may prevent or decrease complications (Diabetes Survival Skills Education) will improve Outcome: Progressing   Problem: Skin Integrity: Goal: Risk for impaired skin integrity will decrease Outcome: Progressing   Problem: Pain Managment: Goal: General experience of comfort will improve and/or be controlled Outcome: Progressing   Problem: Safety: Goal: Ability to remain free from injury will improve Outcome: Progressing

## 2023-05-11 NOTE — Progress Notes (Signed)
Report called to [facility] Cath lab. Patient transported by [facility] at this time.

## 2023-05-11 NOTE — Progress Notes (Signed)
Initial Nutrition Assessment  DOCUMENTATION CODES:   Severe malnutrition in context of acute illness/injury  INTERVENTION:   -Ensure Plus High Protein po BID, each supplement provides 350 kcal and 20 grams of protein.   -Multivitamin with minerals daily  NUTRITION DIAGNOSIS:   Severe Malnutrition related to acute illness (recent stroke) as evidenced by moderate muscle depletion, energy intake < or equal to 50% for > or equal to 5 days, percent weight loss.  GOAL:   Patient will meet greater than or equal to 90% of their needs  MONITOR:   PO intake, Supplement acceptance  REASON FOR ASSESSMENT:   Consult Assessment of nutrition requirement/status  ASSESSMENT:   78 yo with hx recent cerebellar stroke, breast cancer on tamoxifen, T2DM, neuropathy, vertigo as well as other medical issues here after recent discharge from inpatient rehab (from his cerebellar stroke) with a fall and episode of syncope with hypotension.  Patient in room, just finished working with therapies. Has not eaten today.  Pt reports he didn't do well with therapy and his blood pressure keeps dropping. He just completed 23 days in CIR, within a day states he fell at home. Pt states he wasn't eating well d/t having no appetite during his rehab stay, states he went a week and a half without eating at all, "starving myself". States supplements were not offered to him. RD encouraged pt to drink supplements this admission, he hesitantly agrees and wants chocolate flavor.  Pt reports eating well with good appetite prior to stroke.  Pt denies swallowing issues. States he has had a cough for 4 months now, thinks it is related to post-nasal drip.  Pt reports UBW ~237 lbs before stroke on 1/2.  Weighed patient using bed scale: 222 lbs Pt has lost  15 lbs x 4 weeks, this is a 6% wt loss x 4 weeks, significant for time frame.  Medications: Insulin, Miralax  Labs reviewed: CBGs: 136-254 Low Mg  HgbA1c: 12.4 in Dec  2024  NUTRITION - FOCUSED PHYSICAL EXAM:  Flowsheet Row Most Recent Value  Orbital Region No depletion  Upper Arm Region No depletion  Thoracic and Lumbar Region No depletion  Buccal Region No depletion  Temple Region No depletion  Clavicle Bone Region No depletion  Clavicle and Acromion Bone Region No depletion  Scapular Bone Region No depletion  Dorsal Hand No depletion  Patellar Region Moderate depletion  Anterior Thigh Region Moderate depletion  Posterior Calf Region Moderate depletion  Edema (RD Assessment) None  Hair Reviewed  [thinning]  Eyes Reviewed  Mouth Reviewed  Skin Reviewed  Nails Reviewed       Diet Order:   Diet Order             Diet Carb Modified Fluid consistency: Thin; Room service appropriate? Yes  Diet effective now                   EDUCATION NEEDS:   Education needs have been addressed  Skin:  Skin Assessment: Skin Integrity Issues: Skin Integrity Issues:: Other (Comment) Other: ID of buttocks, scrotum  Last BM:  1/26  Height:   Ht Readings from Last 1 Encounters:  05/09/23 6' (1.829 m)    Weight:   Wt Readings from Last 1 Encounters:  05/11/23 98 kg    BMI:  Body mass index is 29.3 kg/m.  Estimated Nutritional Needs:   Kcal:  2100-2300  Protein:  100-110g  Fluid:  2.1L/day   Tilda Franco, MS, RD, LDN Inpatient Clinical  Dietitian Contact via Secure chat

## 2023-05-11 NOTE — TOC Initial Note (Signed)
Transition of Care [facility]) - Initial/Assessment Note    Patient Details  Name: John [name] MRN: [id number] Date of Birth: September 20, 1945  Transition [facility]) CM/SW Contact:    Otelia Santee, LCSW Phone Number: 05/11/2023, 12:55 PM  Clinical Narrative:                 Met with pt at bedside to discuss recommendation for SNF at discharge. Pt agreeable to recommendation and reports having not been to SNF in the past but, recently being discharged from CIR on 1/25.  Pt has been worked up for SNF and currently awaiting bed offers.   Expected Discharge Plan: Skilled Nursing Facility Barriers to Discharge: Continued Medical Work up, SNF Pending bed offer   Patient Goals and CMS Choice Patient states their goals for this hospitalization and ongoing recovery are:: To go to SNF          Expected Discharge Plan and Services In-house Referral: Clinical Social Work Discharge Planning Services: NA Post Acute Care Choice: Skilled Nursing Facility Living arrangements for the past 2 months: Single Family Home                 DME Arranged: N/A DME Agency: NA                  Prior Living Arrangements/Services Living arrangements for the past 2 months: Single Family Home Lives with:: Spouse Patient language and need for interpreter reviewed:: Yes Do you feel safe going back to the place where you live?: Yes      Need for Family Participation in Patient Care: Yes (Comment) Care giver support system in place?: Yes (comment) Current home services: DME Criminal Activity/Legal Involvement Pertinent to Current Situation/Hospitalization: No - Comment as needed  Activities of Daily Living   ADL Screening (condition at time of admission) Independently performs ADLs?: No Does the patient have a NEW difficulty with bathing/dressing/toileting/self-feeding that is expected to last >3 days?: No Does the patient have a NEW difficulty with getting in/out of bed, walking, or climbing  stairs that is expected to last >3 days?: No Does the patient have a NEW difficulty with communication that is expected to last >3 days?: No Is the patient deaf or have difficulty hearing?: Yes Does the patient have difficulty seeing, even when wearing glasses/contacts?: No Does the patient have difficulty concentrating, remembering, or making decisions?: No  Permission Sought/Granted Permission sought to share information with : Facility Medical sales representative, Family Supports Permission granted to share information with : Yes, Verbal Permission Granted  Share Information with NAME: Shela Leff  Permission granted to share info w AGENCY: SNF's  Permission granted to share info w Relationship: Spouse     Emotional Assessment Appearance:: Appears stated age Attitude/Demeanor/Rapport: Engaged Affect (typically observed): Accepting, Depressed Orientation: : Oriented to Self, Oriented to Place, Oriented to  Time, Oriented to Situation Alcohol / Substance Use: Not Applicable Psych Involvement: No (comment)  Admission diagnosis:  Syncope [R55] Fall, initial encounter [W19.XXXA] Fall at home, initial encounter [phone].Lorne Skeens, Y92.009] Patient Active Problem List   Diagnosis Date Noted   Syncope 05/10/2023   Fall at home, initial encounter 05/08/2023   Hypotension 05/08/2023   Abnormal LFTs 05/08/2023   Anemia 05/08/2023   Coping style affecting medical condition 05/02/2023   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) 04/28/2023   Brainstem infarct, acute (HCC) 04/25/2023   Pressure injury of skin 04/25/2023   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  Acute kidney injury 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) 04/17/2023   DKA (diabetic ketoacidosis) (HCC) 04/16/2023   Type 2 diabetes mellitus with hyperglycemia, with long-term current use of insulin (HCC) 07/26/2022   Bronchitis 03/20/2021   Acute non-recurrent frontal sinusitis 03/20/2021   Viral  URI with cough 01/13/2021   Hyperlipidemia 11/21/2020   Dizziness and giddiness 10/20/2020   Localized swelling of both lower extremities 10/20/2020   Tendinitis of right hip flexor 10/20/2020   Aortic atherosclerosis (HCC) 09/10/2020   Insomnia 09/10/2020   Cognitive impairment 08/18/2020   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   Increasing residual urine 06/04/2020   History of COVID-19 12/10/2019   Physical deconditioning 12/10/2019   Fatigue 12/10/2019   Unsteady gait 12/10/2019   Pneumonia due to COVID-19 virus 10/26/2019   Peripheral neuropathy 10/17/2019   Depression, major, single episode, 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/2019   History of breast cancer    GERD (gastroesophageal reflux disease)    BPH (benign prostatic hyperplasia)    CTS (carpal tunnel syndrome)    Diabetic polyneuropathy associated with type 2 diabetes mellitus (HCC) 09/29/2019   Neuropathic pain of foot 09/29/2019   Port-A-Cath in place 01/03/2019   Peripheral sensory neuropathy 05/17/2018   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   Hypomagnesemia 01/18/2018   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) 12/13/2017   Axillary adenopathy 12/09/2017   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   S/P cataract extraction and insertion of intraocular lens 01/17/2017   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   PVD (posterior vitreous detachment), left 01/07/2017   Hyperopia of both eyes with astigmatism 11/09/2016   Class 1 obesity due to excess calories without serious comorbidity with body mass index (BMI) of 31.0 to 31.9 in adult 09/17/2016   Encounter for monitoring tamoxifen therapy 03/05/2016   Insulin dependent type 2 diabetes mellitus (HCC) 07/02/2015   Diabetes 1.5, 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 [address] Phone: [phone] Fax: [phone]  [facility] [address] [place] [address] Phone: [phone] Fax: [phone]     Social Drivers of Health (SDOH) Social History: SDOH Screenings   Food Insecurity: No Food Insecurity (05/10/2023)  Housing: Low Risk  (05/10/2023)  Transportation Needs: No Transportation Needs (05/10/2023)  Utilities: Not At Risk (05/10/2023)  Alcohol Screen: Low Risk  (01/27/2023)  Depression (PHQ2-9): Low Risk  (01/28/2023)  Financial Resource Strain: Low Risk  (04/07/2023)  Physical Activity: Unknown (04/07/2023)  Social Connections: Moderately Isolated (05/10/2023)  Stress: No Stress Concern Present (04/07/2023)  Tobacco Use: Low Risk  (05/08/2023)   SDOH Interventions:     Readmission Risk Interventions    05/11/2023   12:52 PM 04/22/2023   12:12 PM  Readmission Risk Prevention Plan  Medication Screening  Complete  Transportation Screening Complete Complete  PCP or Specialist Appt within 3-5 Days Complete   HRI or Home Care Consult Complete   Social Work Consult for Recovery Care Planning/Counseling Complete   Palliative Care Screening Not Applicable   Medication Review Oceanographer) Complete

## 2023-05-11 NOTE — H&P (View-Only) (Signed)
With IV dopamine, heart rate increased to 80 bpm with a narrower QRS complex. Occasionally has a pause (2-3 seconds, mildly symptomatic). Awaiting [facility] transport to 2H at [facility] hospital.

## 2023-05-11 NOTE — Consult Note (Signed)
Cardiology Consultation   Patient ID: John [name] MRN: [id number]; DOB: 02/08/1946  Admit date: 05/08/2023 Date of Consult: 05/11/2023  PCP:  Sharlene Dory, DO   [facility] Providers Cardiologist:  None   { Click here to update MD or APP on Care Team, Refresh:1}     Patient Profile:   John [name] is a 78 y.o. male with a hx of hypertenison, DM type II, neuropathy, male breast cancer s/p radical mastectomy on tamoxifen, vertigo, chronic small vessel infarct, DM type II, hyperlipidemia, BPH who is being seen 05/11/2023 for the evaluation of complete heart block at the request of Dr. Burnett Harry.  History of Present Illness:   John [name] was admitted on 1/26 after a fall/syncopal episode in his bathroom. Patient was admitted in January with acute right middle cerebellar peduncle stroke and was discharged to rehab on 1/10, subsequently discharged home on 1/26. Initial workup in the ED noted AKI, creatinine 1.32, troponin 9, platelets 461, AST 49, ALT 47. He was noted to be hypotensive on arrival to the ED. CT head without acute intracranial abnormality. MRI with punctate acute infarct in the right middle cerebellar peduncle (consistent with 1/7 CVA). D-dimer elevated and patient underwent CTA chest. This noted small volume occlusive and nonocclusive PE in right lung lower lobe pulmonary artery branches. Patient started on Eliquis and taken off Plavix. On 1/29, patient developed sudden bradycardia with HR 30bpm. This was associated with significant drop in BP. Patient noted to maintain mentation but reported feeling poorly. Symptoms similar to those felt prior to syncopal episode prompting admission. ECG with stable wide complex rhythm, rate 29-30bpm consistent with idioventricular escape rhythm. P waves march out but dissociated with QRS. ECG consistent with complete heart block. Prior ECG tracings with RBBB.   Transfer to [facility] for temp wire placement and EP evaluation  arranged.     Past Medical History:  Diagnosis Date   BPH (benign prostatic hyperplasia)    CTS (carpal tunnel syndrome)    Depression    Diabetes mellitus    Essential hypertension    GERD (gastroesophageal reflux disease)    History of breast cancer    Hypercholesteremia    Neuropathy     Past Surgical History:  Procedure Laterality Date   CATARACT EXTRACTION, BILATERAL  01/18/2017   LITHOTRIPSY     MASTECTOMY Right 05/02/2018   TONSILLECTOMY AND ADENOIDECTOMY       {Home Medications (Optional):21181}  Inpatient Medications: Scheduled Meds:  apixaban  10 mg Oral BID   Followed by   Melene Muller ON 05/16/2023] apixaban  5 mg Oral BID   atorvastatin  80 mg Oral QHS   atropine       buPROPion  75 mg Oral BID   feeding supplement  237 mL Oral BID BM   finasteride  5 mg Oral Daily   insulin aspart  0-5 Units Subcutaneous QHS   insulin aspart  0-9 Units Subcutaneous TID WC   insulin glargine-yfgn  10 Units Subcutaneous Daily   metFORMIN  1,000 mg Oral BID WC   multivitamin with minerals  1 tablet Oral Daily   pantoprazole  40 mg Oral Daily   polyethylene glycol  17 g Oral BID   sodium chloride flush  3 mL Intravenous Q12H   tamsulosin  0.4 mg Oral Daily   Continuous Infusions:  PRN Meds: acetaminophen **OR** acetaminophen, atropine, guaiFENesin-dextromethorphan  Allergies:    Allergies  Allergen Reactions   Ramipril Anaphylaxis   Other Diarrhea  Severe intolerance to Chemotherapy in the past.   Sertraline Other (See Comments)    "Extreme headaches"   Adhesive [Tape] Rash    Social History:   Social History   Socioeconomic History   Marital status: Married    Spouse name: Not on file   Number of children: Not on file   Years of education: Not on file   Highest education level: Bachelor's degree (e.g., BA, AB, BS)  Occupational History   Not on file  Tobacco Use   Smoking status: Never   Smokeless tobacco: Never  Vaping Use   Vaping status: Never Used   Substance and Sexual Activity   Alcohol use: No   Drug use: No   Sexual activity: Not Currently    Partners: Female  Other Topics Concern   Not on file  Social History Narrative   Not on file   Social Drivers of Health   Financial Resource Strain: Low Risk  (04/07/2023)   Overall Financial Resource Strain (CARDIA)    Difficulty of Paying Living Expenses: Not hard at all  Food Insecurity: No Food Insecurity (05/10/2023)   Hunger Vital Sign    Worried About Running Out of Food in the Last Year: Never true    Ran Out of Food in the Last Year: Never true  Transportation Needs: No Transportation Needs (05/10/2023)   PRAPARE - Administrator, Civil Service (Medical): No    Lack of Transportation (Non-Medical): No  Physical Activity: Unknown (04/07/2023)   Exercise Vital Sign    Days of Exercise per Week: 0 days    Minutes of Exercise per Session: Not on file  Stress: No Stress Concern Present (04/07/2023)   [employer] of Occupational Health - Occupational Stress Questionnaire    Feeling of Stress : Not at all  Social Connections: Moderately Isolated (05/10/2023)   Social Connection and Isolation Panel [NHANES]    Frequency of Communication with Friends and Family: More than three times a week    Frequency of Social Gatherings with Friends and Family: More than three times a week    Attends Religious Services: Never    Database administrator or Organizations: No    Attends Banker Meetings: Never    Marital Status: Married  Catering manager Violence: Not At Risk (05/10/2023)   Humiliation, Afraid, Rape, and Kick questionnaire    Fear of Current or Ex-Partner: No    Emotionally Abused: No    Physically Abused: No    Sexually Abused: No    Family History:    Family History  Problem Relation Age of Onset   Lymphoma Mother    Cancer Mother    Diabetes Father    Stroke Father    Ovarian cancer Sister    Cancer Sister    Cancer Paternal  Grandmother      ROS:  Please see the history of present illness.   All other ROS reviewed and negative.     Physical Exam/Data:   Vitals:   05/11/23 1314 05/11/23 1331 05/11/23 1332 05/11/23 1333  BP: (!) 119/50 (!) 120/52    Pulse: (!) 35 (!) 32 (!) 32 (!) 31  Resp:      Temp:      TempSrc:      SpO2:  96% 95% 96%  Weight:      Height:        Intake/Output Summary (Last 24 hours) at 05/11/2023 1355 Last data filed at 05/10/2023 2330 Gross  per 24 hour  Intake --  Output 550 ml  Net -550 ml      05/11/2023    5:00 AM 05/09/2023   11:14 AM 04/22/2023    3:39 PM  Last 3 Weights  Weight (lbs) 216 lb 0.8 oz 224 lb 9.6 oz 224 lb 9.6 oz  Weight (kg) 98 kg 101.878 kg 101.878 kg     Body mass index is 29.3 kg/m.  General:  Well nourished, well developed, in no acute distress*** HEENT: normal Neck: no JVD Vascular: No carotid bruits; Distal pulses 2+ bilaterally Cardiac:  normal S1, S2; RRR; no murmur *** Lungs:  clear to auscultation bilaterally, no wheezing, rhonchi or rales  Abd: soft, nontender, no hepatomegaly  Ext: no edema Musculoskeletal:  No deformities, BUE and BLE strength normal and equal Skin: warm and dry  Neuro:  CNs 2-12 intact, no focal abnormalities noted Psych:  Normal affect   EKG:  Admission ECG with atypical RBBB, intraventricular conduction delay.  Telemetry:  Telemetry was personally reviewed and demonstrates:  ***  Relevant CV Studies:  05/10/23 Limited TTE  IMPRESSIONS     1. Left ventricular ejection fraction, by estimation, is 60 to 65%. The  left ventricle has normal function. The left ventricle has no regional  wall motion abnormalities. Left ventricular diastolic function could not  be evaluated.   2. Right ventricular systolic function is normal. The right ventricular  size is normal. There is mildly elevated pulmonary artery systolic  pressure. The estimated right ventricular systolic pressure is 38.0 mmHg.   3. The mitral valve  is normal in structure. No evidence of mitral valve  regurgitation. No evidence of mitral stenosis.   4. The aortic valve is tricuspid. There is mild calcification of the  aortic valve. Aortic valve regurgitation is not visualized. No aortic  stenosis is present.   5. The inferior vena cava is dilated in size with >50% respiratory  variability, suggesting right atrial pressure of 8 mmHg.   Conclusion(s)/Recommendation(s): Technically very limited study due to  poor sound wave transmission. LV/RV look grossly normal.   FINDINGS   Left Ventricle: Left ventricular ejection fraction, by estimation, is 60  to 65%. The left ventricle has normal function. The left ventricle has no  regional wall motion abnormalities. The left ventricular internal cavity  size was normal in size. There is   no left ventricular hypertrophy. Left ventricular diastolic function  could not be evaluated.   Right Ventricle: The right ventricular size is normal. No increase in  right ventricular wall thickness. Right ventricular systolic function is  normal. There is mildly elevated pulmonary artery systolic pressure. The  tricuspid regurgitant velocity is 2.74   m/s, and with an assumed right atrial pressure of 8 mmHg, the estimated  right ventricular systolic pressure is 38.0 mmHg.   Left Atrium: Left atrial size was normal in size.   Right Atrium: Right atrial size was normal in size.   Pericardium: There is no evidence of pericardial effusion.   Mitral Valve: The mitral valve is normal in structure. No evidence of  mitral valve stenosis.   Tricuspid Valve: The tricuspid valve is normal in structure. Tricuspid  valve regurgitation is mild . No evidence of tricuspid stenosis.   Aortic Valve: The aortic valve is tricuspid. There is mild calcification  of the aortic valve. Aortic valve regurgitation is not visualized. No  aortic stenosis is present.   Pulmonic Valve: The pulmonic valve was normal in  structure. Pulmonic valve  regurgitation is trivial. No evidence of pulmonic stenosis.   Aorta: The aortic root is normal in size and structure.   Venous: The inferior vena cava is dilated in size with greater than 50%  respiratory variability, suggesting right atrial pressure of 8 mmHg.   IAS/Shunts: No atrial level shunt detected by color flow Doppler.    Laboratory Data:  High Sensitivity Troponin:   Recent Labs  Lab 05/08/23 1835  TROPONINIHS 9     Chemistry Recent Labs  Lab 05/08/23 1405 05/09/23 0454 05/11/23 0708  NA 138 137 136  K 4.6 3.8 4.0  CL 100 102 100  CO2 22 24 23   GLUCOSE 123* 116* 180*  BUN 13 14 11   CREATININE 1.32* 0.92 0.88  CALCIUM 9.9 8.7* 9.2  MG  --   --  1.6*  GFRNONAA 56* >60 >60  ANIONGAP 16* 11 13    Recent Labs  Lab 05/08/23 1405 05/09/23 0454 05/11/23 0708  PROT 7.4 5.5* 6.2*  ALBUMIN 3.6 2.9* 3.2*  AST 49* 42* 69*  ALT 47* 45* 73*  ALKPHOS 59 42 46  BILITOT 0.9 1.0 1.3*   Lipids No results for input(s): "CHOL", "TRIG", "HDL", "LABVLDL", "LDLCALC", "CHOLHDL" in the last 168 hours.  Hematology Recent Labs  Lab 05/08/23 1405 05/08/23 2306 05/09/23 0454 05/11/23 0708  WBC 10.4  --  8.1 9.8  RBC 5.08 4.41 4.06* 4.40  HGB 13.7  --  11.0* 11.9*  HCT 43.5  --  34.3* 37.3*  MCV 85.6  --  84.5 84.8  MCH 27.0  --  27.1 27.0  MCHC 31.5  --  32.1 31.9  RDW 15.8*  --  15.7* 16.0*  PLT 461*  --  381 380   Thyroid No results for input(s): "TSH", "FREET4" in the last 168 hours.  BNPNo results for input(s): "BNP", "PROBNP" in the last 168 hours.  DDimer  Recent Labs  Lab 05/08/23 1835  DDIMER 1.63*     Radiology/Studies:  ECHOCARDIOGRAM LIMITED Result Date: 05/10/2023    ECHOCARDIOGRAM LIMITED REPORT   Patient Name:   John [name] Date of Exam: 05/10/2023 Medical Rec #:  [id number]        Height:       72.0 in Accession #:    [id number]       Weight:       224.6 lb Date of Birth:  02-11-1946        BSA:          2.238 m  Patient Age:    77 years         BP:           146/75 mmHg Patient Gender: M                HR:           79 bpm. Exam Location:  Inpatient Procedure: Limited Echo, Cardiac Doppler and Limited Color Doppler Indications:    Pulmonary Embolism  History:        Patient has prior history of Echocardiogram examinations, most                 recent 04/21/2023. Stroke, Signs/Symptoms:Hypotension; Risk                 Factors:Diabetes and Breast Cancer.  Sonographer:    Amy Chionchio Referring Phys: [id number] A CALDWELL POWELL JR IMPRESSIONS  1. Left ventricular ejection fraction, by estimation, is 60 to 65%. The left ventricle has normal function.  The left ventricle has no regional wall motion abnormalities. Left ventricular diastolic function could not be evaluated.  2. Right ventricular systolic function is normal. The right ventricular size is normal. There is mildly elevated pulmonary artery systolic pressure. The estimated right ventricular systolic pressure is 38.0 mmHg.  3. The mitral valve is normal in structure. No evidence of mitral valve regurgitation. No evidence of mitral stenosis.  4. The aortic valve is tricuspid. There is mild calcification of the aortic valve. Aortic valve regurgitation is not visualized. No aortic stenosis is present.  5. The inferior vena cava is dilated in size with >50% respiratory variability, suggesting right atrial pressure of 8 mmHg. Conclusion(s)/Recommendation(s): Technically very limited study due to poor sound wave transmission. LV/RV look grossly normal. FINDINGS  Left Ventricle: Left ventricular ejection fraction, by estimation, is 60 to 65%. The left ventricle has normal function. The left ventricle has no regional wall motion abnormalities. The left ventricular internal cavity size was normal in size. There is  no left ventricular hypertrophy. Left ventricular diastolic function could not be evaluated. Right Ventricle: The right ventricular size is normal. No increase in right  ventricular wall thickness. Right ventricular systolic function is normal. There is mildly elevated pulmonary artery systolic pressure. The tricuspid regurgitant velocity is 2.74  m/s, and with an assumed right atrial pressure of 8 mmHg, the estimated right ventricular systolic pressure is 38.0 mmHg. Left Atrium: Left atrial size was normal in size. Right Atrium: Right atrial size was normal in size. Pericardium: There is no evidence of pericardial effusion. Mitral Valve: The mitral valve is normal in structure. No evidence of mitral valve stenosis. Tricuspid Valve: The tricuspid valve is normal in structure. Tricuspid valve regurgitation is mild . No evidence of tricuspid stenosis. Aortic Valve: The aortic valve is tricuspid. There is mild calcification of the aortic valve. Aortic valve regurgitation is not visualized. No aortic stenosis is present. Pulmonic Valve: The pulmonic valve was normal in structure. Pulmonic valve regurgitation is trivial. No evidence of pulmonic stenosis. Aorta: The aortic root is normal in size and structure. Venous: The inferior vena cava is dilated in size with greater than 50% respiratory variability, suggesting right atrial pressure of 8 mmHg. IAS/Shunts: No atrial level shunt detected by color flow Doppler. RIGHT VENTRICLE          IVC RV Basal diam:  4.10 cm  IVC diam: 2.00 cm TAPSE (M-mode): 1.9 cm RIGHT ATRIUM           Index RA Area:     16.40 cm RA Volume:   44.30 ml  19.79 ml/m  PULMONIC VALVE PV Vmax:          0.88 m/s PV Peak grad:     3.1 mmHg PR End Diast Vel: 7.18 msec  TRICUSPID VALVE TR Peak grad:   30.0 mmHg TR Vmax:        274.00 cm/s Arvilla Meres MD Electronically signed by Arvilla Meres MD Signature Date/Time: 05/10/2023/12:01:37 PM    Final    VAS [place] LOWER EXTREMITY VENOUS (DVT) Result Date: 05/09/2023  Lower Venous DVT Study Patient Name:  John [name]  Date of Exam:   05/09/2023 Medical Rec #: [id number]         Accession #:    [id number] Date of  Birth: January 10, 1946         Patient Gender: M Patient Age:   28 years Exam Location:  [facility] Procedure:      VAS [place] LOWER  EXTREMITY VENOUS (DVT) Referring Phys: A POWELL JR --------------------------------------------------------------------------------  Indications: Edema.  Risk Factors: Hx of breast cancer on tamoxifen. Comparison Study: No previous exams Performing Technologist: Jody Hill RVT, RDMS  Examination Guidelines: A complete evaluation includes B-mode imaging, spectral Doppler, color Doppler, and power Doppler as needed of all accessible portions of each vessel. Bilateral testing is considered an integral part of a complete examination. Limited examinations for reoccurring indications may be performed as noted. The reflux portion of the exam is performed with the patient in reverse Trendelenburg.  +---------+---------------+---------+-----------+----------+--------------+ RIGHT    CompressibilityPhasicitySpontaneityPropertiesThrombus Aging +---------+---------------+---------+-----------+----------+--------------+ CFV      Full           Yes      Yes                                 +---------+---------------+---------+-----------+----------+--------------+ SFJ      Full                                                        +---------+---------------+---------+-----------+----------+--------------+ FV Prox  Full           Yes      Yes                                 +---------+---------------+---------+-----------+----------+--------------+ FV Mid   Full           Yes      Yes                                 +---------+---------------+---------+-----------+----------+--------------+ FV DistalFull           Yes      Yes                                 +---------+---------------+---------+-----------+----------+--------------+ PFV      Full                                                         +---------+---------------+---------+-----------+----------+--------------+ POP      Full           Yes      Yes                                 +---------+---------------+---------+-----------+----------+--------------+ PTV      Full                                                        +---------+---------------+---------+-----------+----------+--------------+ PERO     Full                                                        +---------+---------------+---------+-----------+----------+--------------+   +---------+----------------+---------+-----------+----------+-----------------+  LEFT     Compressibility PhasicitySpontaneityPropertiesThrombus Aging    +---------+----------------+---------+-----------+----------+-----------------+ CFV      Partial         Yes      Yes                  Acute             +---------+----------------+---------+-----------+----------+-----------------+ SFJ      Partial                                       Acute             +---------+----------------+---------+-----------+----------+-----------------+ FV Prox  Full            Yes      Yes                                    +---------+----------------+---------+-----------+----------+-----------------+ FV Mid   Full            No       Yes                                    +---------+----------------+---------+-----------+----------+-----------------+ FV DistalFull            Yes      Yes                                    +---------+----------------+---------+-----------+----------+-----------------+ PFV      Full                                                            +---------+----------------+---------+-----------+----------+-----------------+ POP      Full            Yes      Yes                                    +---------+----------------+---------+-----------+----------+-----------------+ PTV      Full                                                             +---------+----------------+---------+-----------+----------+-----------------+ PERO     Full                                                            +---------+----------------+---------+-----------+----------+-----------------+ EIV      Partial (distal)Yes      Yes                  Age Indeterminate +---------+----------------+---------+-----------+----------+-----------------+ Unable to visualized CIV due to  habitus / bowel gas    Summary: BILATERAL: -No evidence of popliteal cyst, bilaterally. RIGHT: - There is no evidence of deep vein thrombosis in the lower extremity.  LEFT: - Findings consistent with acute deep vein thrombosis involving the left common femoral vein, and SF junction.  - Findings consistent with age indeterminate deep vein thrombosis involving the external iliac vein.   *See table(s) above for measurements and observations. Electronically signed by Gerarda Fraction on 05/09/2023 at 5:03:50 PM.    Final    CT Angio Chest Pulmonary Embolism (PE) W or WO Contrast Addendum Date: 05/09/2023 ADDENDUM REPORT: 05/09/2023 09:49 ADDENDUM: Critical Value/emergent results were called by telephone at the time of interpretation on 05/09/2023 at 9:49 am to Dr. Rose Phi , who verbally acknowledged these results. Electronically Signed   By: Jules Schick M.D.   On: 05/09/2023 09:49   Result Date: 05/09/2023 CLINICAL DATA:  Pulmonary embolism (PE) suspected, low to intermediate prob, positive D-dimer. Weakness. Fall. EXAM: CT ANGIOGRAPHY CHEST WITH CONTRAST TECHNIQUE: Multidetector CT imaging of the chest was performed using the standard protocol during bolus administration of intravenous contrast. Multiplanar CT image reconstructions and MIPs were obtained to evaluate the vascular anatomy. RADIATION DOSE REDUCTION: This exam was performed according to the departmental dose-optimization program which includes automated exposure control, adjustment of the mA and/or kV  according to patient size and/or use of iterative reconstruction technique. CONTRAST:  75mL OMNIPAQUE IOHEXOL 350 MG/ML SOLN COMPARISON:  CT scan chest from 08/29/2020. FINDINGS: Cardiovascular: There is small volume pulmonary embolism. There is nonocclusive pulmonary embolism in the right lower lobe lobar artery and occlusive embolism in the right lower lobe segmental pulmonary arteries. No right heart strain. There are segmental heterogeneous opacities in the right lung lower lobe which enhances less than other lung parenchyma and is compatible with lung infarction. Normal cardiac size. No pericardial effusion. No aortic aneurysm. There are coronary artery calcifications, in keeping with coronary artery disease. There are also mild peripheral atherosclerotic vascular calcifications of thoracic aorta and its major branches. Mediastinum/Nodes: Visualized thyroid gland appears grossly unremarkable. No solid / cystic mediastinal masses. The esophagus is nondistended precluding optimal assessment. No axillary, mediastinal or hilar lymphadenopathy by size criteria. Lungs/Pleura: The central tracheo-bronchial tree is patent. There is new small right pleural effusion with associated compressive atelectatic changes in the right lower lobe. There are additional subsegmental atelectatic changes in the right upper lobe and left lung lower lobe. No lung mass or pneumothorax. No suspicious lung nodules. No left pleural effusion. Redemonstration of peripheral/subpleural opacity with associated linear areas of scarring/atelectasis in the right lung upper lobe, anteriorly, nonspecific but commonly seen in patients with prior radiation. Upper Abdomen: Small volume cholelithiasis without acute cholecystitis. There are scattered calcified granulomas in the spleen. Visualized upper abdominal viscera within normal limits. Musculoskeletal: The visualized soft tissues of the chest wall are grossly unremarkable. No suspicious osseous  lesions. There are mild multilevel degenerative changes in the visualized spine. Review of the MIP images confirms the above findings. IMPRESSION: 1. There is small volume occlusive and nonocclusive pulmonary embolism in the right lung lower lobe pulmonary artery branches, as described in detail above. No right heart strain. 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 2. There is small right pleural effusion and additional areas of subsegmental atelectasis in the right upper lobe and left lung lower lobe. 3. Multiple other nonacute observations, as described above. Aortic Atherosclerosis (ICD10-I70.0). Electronically Signed: By: Jules Schick M.D. On: 05/09/2023  09:39   CT CERVICAL SPINE WO CONTRAST Result Date: 05/09/2023 CLINICAL DATA:  Neck trauma.  Mechanical fall EXAM: CT CERVICAL SPINE WITHOUT CONTRAST TECHNIQUE: Multidetector CT imaging of the cervical spine was performed without intravenous contrast. Multiplanar CT image reconstructions were also generated. RADIATION DOSE REDUCTION: This exam was performed according to the departmental dose-optimization program which includes automated exposure control, adjustment of the mA and/or kV according to patient size and/or use of iterative reconstruction technique. COMPARISON:  MRI the cervical spine 10/23/2015. FINDINGS: Alignment: No significant listhesis is present. Straightening of the normal cervical lordosis is again seen. Skull base and vertebrae: The craniocervical junction is normal. Vertebral body heights are normal. No acute fractures are present. Soft tissues and spinal canal: No prevertebral fluid or swelling. No visible canal hematoma. Disc levels: Mild degenerative changes are present in the lower cervical spine. No focal stenosis is present. Upper chest: The lung apices are clear. The thoracic inlet is within normal limits. IMPRESSION: 1. No acute fracture or traumatic subluxation. 2. Mild degenerative changes in the  lower cervical spine without focal stenosis. Electronically Signed   By: Marin Roberts M.D.   On: 05/09/2023 09:43   MR BRAIN WO CONTRAST Result Date: 05/09/2023 CLINICAL DATA:  Syncope/presyncope, cerebrovascular cause suspected EXAM: MRI HEAD WITHOUT CONTRAST TECHNIQUE: Multiplanar, multiecho pulse sequences of the brain and surrounding structures were obtained without intravenous contrast. COMPARISON:  CT head 05/08/2023. FINDINGS: Brain: Punctate acute infarct in the right middle cerebellar peduncle. Moderate patchy T2/FLAIR hyperintensities in the white matter nonspecific but compatible with chronic microvascular ischemic disease. Cerebral atrophy. No evidence of acute hemorrhage, mass lesion or midline shift. Remote infarcts in bilateral thalami in the pons. Vascular: Major arterial flow voids are maintained Skull and upper cervical spine: Normal marrow signal. Sinuses/Orbits: Paranasal sinus mucosal thickening. No acute orbital findings. Other: No mastoid effusions. IMPRESSION: 1. Punctate acute infarct in the right middle cerebellar peduncle. 2. 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 Electronically Signed   By: Feliberto Harts M.D.   On: 05/09/2023 00:31   CT Head Wo Contrast Result Date: 05/08/2023 CLINICAL DATA:  Head trauma, minor.  Weakness.  Fall. EXAM: CT HEAD WITHOUT CONTRAST TECHNIQUE: Contiguous axial images were obtained from the base of the skull through the vertex without intravenous contrast. RADIATION DOSE REDUCTION: This exam was performed according to the departmental dose-optimization program which includes automated exposure control, adjustment of the mA and/or kV according to patient size and/or use of iterative reconstruction technique. COMPARISON:  CT head without contrast 04/20/2023 FINDINGS: Brain: Moderate atrophy and white matter disease is stable. Remote lacunar infarcts are again noted within the thalami bilaterally. A remote lacunar infarct is  present in the inferior left caudate head. No acute cortical infarct or significant interval change is present. The ventricles are proportionate to the degree of atrophy. No significant extraaxial fluid collection is present. No acute hemorrhage or mass lesion is present. Midline structures are within normal limits. The brainstem and cerebellum are within normal limits. Vascular: Atherosclerotic calcifications are present within the cavernous internal carotid arteries bilaterally. No hyperdense vessel is present. Skull: Calvarium is intact. No focal lytic or blastic lesions are present. No significant extracranial soft tissue lesion is present. Sinuses/Orbits: Minimal fluid is present in the maxillary sinuses bilaterally. Scattered ethmoid opacification is present. The paranasal sinuses and mastoid air cells are otherwise clear. Bilateral lens replacements are noted. Globes and orbits are otherwise unremarkable. IMPRESSION: 1. No acute intracranial abnormality or significant interval change.  2. Stable atrophy and white matter disease likely reflects the sequela of chronic microvascular ischemia. 3. Remote lacunar infarcts of the thalami bilaterally and inferior left caudate head. 4. Minimal bilateral maxillary and ethmoid sinus disease. Electronically Signed   By: Marin Roberts M.D.   On: 05/08/2023 15:35   DG Chest 2 View Result Date: 05/08/2023 CLINICAL DATA:  Dizziness and fall EXAM: CHEST - 2 VIEW COMPARISON:  Chest radiograph dated 04/28/2023 FINDINGS: Patient is rotated to the right. Low lung volumes with bronchovascular crowding. Right mid lung linear and patchy opacity. Left lower lung patchy opacity. Small right pleural effusion. No pneumothorax. The heart size and mediastinal contours are within normal limits. No radiographic finding of acute displaced fracture. IMPRESSION: 1. Low lung volumes with bronchovascular crowding. Right mid lung linear and patchy opacity and left lower lung patchy  opacity may reflect atelectasis, aspiration, or pneumonia. 2. Small right pleural effusion. 3.  No radiographic finding of acute displaced fracture. Electronically Signed   By: Agustin Cree M.D.   On: 05/08/2023 14:19     Assessment and Plan:   Syncope Transient complete heart block Patient admitted after a syncopal episode at home. Initial evaluation lead to concern that syncope was secondary to orthostatic hypotension or vasovagal episode. Today patient had sudden onset complete heart block with idioventricular escape rate 29 and symptoms essentially identical to those prior to his passing out. Not on any AV nodal agents.  Can now conclude that syncope at home secondary to complete heart block.  IV dopamine initiated, rates improved to the 80s but patient still having symptomatic pauses of ~3 seconds.  Transfer urgently to [facility] for temp wire placement. He will come to [facility].   Acute PE D-dimer elevated and patient underwent CTA chest this admission. This noted small volume occlusive and nonocclusive PE in right lung lower lobe pulmonary artery branches.   No evidence of right heart strain. Patient started on Eliquis and taken off Plavix.  Plans are to hold Plavix for duration of Eliquis therapy, then resume.   Cerebellar stroke Patient was admitted in January with acute right middle cerebellar peduncle stroke and was discharged to rehab on 1/10. Repeat imaging this admission after syncope and fall stable.   Given PE, now on Eliquis. Plans are to hold Plavix for duration of Eliquis therapy, then resume.   DM type II A1c 12.4. Patient on Semglee 10units daily with sliding scale with bedtime coverage. Home metformin 1000mg  BID resumed today.  Continue Semglee, SSI, and meformin. May need to consult glucose management team.   Breast cancer S/p radical mastectomy on tamoxifen  Continue outpatient follow up  Hypertension Patient with notable decrease in BP today in the setting of complete  heart block. Otherwise has been borderline hypertensive.   Continue to hold home amlodipine 5mg  and Lasix 80mg  today given complete heart block.  BPH Continue Flomax 0.4mg  daily.     Risk Assessment/Risk Scores:  {Complete the following score calculators/questions to meet required metrics.  Press F2         :[id number]}   {Is the patient being seen for unstable angina, ACS, NSTEMI or STEMI?:[phone]} {Does this patient have CHF or CHF symptoms?      :[id number]} {Does this patient have ATRIAL FIBRILLATION?:[phone]}  {Are we signing off today?:[id number]}  For questions or updates, please contact [facility] Please consult [email or web address] for contact info under

## 2023-05-11 NOTE — Progress Notes (Signed)
PHARMACY - ANTICOAGULATION CONSULT NOTE  Pharmacy Consult for Apixaban -> heparin Indication:  PE/DVT  Allergies  Allergen Reactions   Ramipril Anaphylaxis   Other Diarrhea    Severe intolerance to Chemotherapy in the past.   Sertraline Other (See Comments)    "Extreme headaches"   Adhesive [Tape] Rash    Patient Measurements: Height: 6' (182.9 cm) Weight: 98 kg (216 lb 0.8 oz) IBW/kg (Calculated) : 77.6 Heparin dosing wt: 98 kg   Vital Signs: Temp: 98.4 F (36.9 C) (01/29 1226) Temp Source: Oral (01/29 0347) BP: 139/80 (01/29 1419) Pulse Rate: 85 (01/29 1419)  Labs: Recent Labs    05/08/23 1835 05/09/23 0454 05/11/23 0708  HGB  --  11.0* 11.9*  HCT  --  34.3* 37.3*  PLT  --  381 380  CREATININE  --  0.92 0.88  TROPONINIHS 9  --   --     Estimated Creatinine Clearance: 86.7 mL/min (by C-G formula based on SCr of 0.88 mg/dL).   Medical History: Past Medical History:  Diagnosis Date   BPH (benign prostatic hyperplasia)    CTS (carpal tunnel syndrome)    Depression    Diabetes mellitus    Essential hypertension    GERD (gastroesophageal reflux disease)    History of breast cancer    Hypercholesteremia    Neuropathy      Assessment: 76 y/oF with PMH of recent cerebellar stroke, breast cancer recently discharged from inpatient rehab who presents with a fall and syncopal episode. CTa chest + for PE, no right heart strain. BLE venous duplex + for acute deep vein thrombosis involving the left common femoral vein, and SF junction, age indeterminate deep vein thrombosis involving the external iliac vein. Pt has been on apixaban 10mg  po BID since 1/27 - last dose today at 0815. Pt brought to cath lab for a temporary pacemaker for heart block. Pharmacy consulted to dose heparin while procedures are needed. Apixaban will be affecting heparin level so will utilize aPTT until levels are correlating. *noted pt with recent cerebellar stroke 04/19/23 but neuro was ok with  full AC with apixaban including loading dose so will not aim for lower goal*  Goal of Therapy: Heparin level 0.3-0.7 units/ml aPTT 66-102 sec Monitor platelets per protocol  Plan:  At 2015, start heparin 1500 units/hr. No bolus with recent apixaban use. Will f/u 8 hr aPTT and heparin level Daily heparin level, aPTT, and CBC  Christoper Fabian, PharmD, BCPS Please see amion for complete clinical pharmacist phone list 05/11/2023,3:25 PM

## 2023-05-11 NOTE — Interval H&P Note (Signed)
History and Physical Interval Note:  05/11/2023 2:53 PM  John [name]  has presented today for surgery, with the diagnosis of heart block.  The various methods of treatment have been discussed with the patient and family. After consideration of risks, benefits and other options for treatment, the patient has consented to  Procedure(s): TEMPORARY PACEMAKER (N/A) as a surgical intervention.  The patient's history has been reviewed, patient examined, no change in status, stable for surgery.  I have reviewed the patient's chart and labs.  Questions were answered to the patient's satisfaction.     Theron Arista [facility] 05/11/2023 2:53 PM

## 2023-05-11 NOTE — Progress Notes (Signed)
Asked to see urgently by attending physician for abrupt onset severe bradycardia 30 bpm.  This was accompanied by significant drop in blood pressure, but his blood pressure parameters remain in normal range.  He is alert and oriented, but does not feel well.  He denies chest pain or shortness of breath but feels weak.  If he coughs his weakness worsens and he is afraid that he might pass out.  His symptoms are very much reminiscent of the syncopal event that brought him to the hospital a couple of days ago. Telemetry shows a stable wide-complex rhythm at 29 bpm consistent with idioventricular rhythm. P waves are seen "marching out".  ECG is not of great quality, but does appear to show sinus rhythm with complete heart block and idioventricular escape rhythm.  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 He just had an echocardiogram yesterday that shows normal left and right ventricular systolic function and no major valvular abnormalities or pericardial effusion. Metabolic parameters are generally normal.   He is not on any medications with negative chronotropic or negative dromotropic effect. He will need a permanent pacemaker.  Since he is alert and conversant right now I do not think we need to implant a temporary pacemaker wire just this minute, but he has temporary transcutaneous pacing pads on.  Will transfer him to [facility] to expedite his necessary intervention. Will have a dopamine infusion on standby. Note that he has a history of previous right mastectomy for breast cancer and had a chemotherapy port in the left subclavian vein, that has since been removed. Also note that he has a small volume pulmonary embolism involving the right lower lobe, with associated lung infarction.  Transferring urgently to [facility].  Full history and physical to follow.  Thurmon Fair, MD,  [facility] [phone] office [phone] pager 05/11/2023 2:00 PM

## 2023-05-11 NOTE — Progress Notes (Signed)
With IV dopamine, heart rate increased to 80 bpm with a narrower QRS complex. Occasionally has a pause (2-3 seconds, mildly symptomatic). Awaiting [facility] transport to 2H at [facility] hospital.

## 2023-05-11 NOTE — Progress Notes (Signed)
Physical Therapy Treatment Patient Details Name: John [name] MRN: [id number] DOB: 01-16-1946 Today's Date: 05/11/2023   History of Present Illness 78 yo male presents to therapy from ED on 05/08/2023 due to fall in home setting. positive for R LE DVT, and PE.Pt recently d/c from rehab at [facility] following CVA. Imaging no acute fx, opacity on CXR and infarcts on MRI with no apparent change from 04/19/2023. Pt PMH:: hypertension, GERD, anemia, BPH, GERD, falls, vertigo, CVA and breast ca., neuropathy    PT Comments  Assisted the patient    to sitting and standing, orthostatic BPs taken:  Supine 121/58  Sitting 104/65   Sitting after  min NT  Standing 85/43  Standing after  min Unable   Patient reports dizziness with standing.  Unable to progress mobility. Prior to  visit, RN stated that MD stated   that the TEDS and binder were not necessary.  Continue PT for  mobility and monitor BP.  Patient will benefit from continued inpatient follow up therapy, <3 hours/day unless able to safely ambulate and BP controlled.    If plan is discharge home, recommend the following: A lot of help with walking and/or transfers;A little help with bathing/dressing/bathroom;Assist for transportation;Assistance with cooking/housework;Help with stairs or ramp for entrance   Can travel by private vehicle        Equipment Recommendations  None recommended by PT    Recommendations for Other Services       Precautions / Restrictions Precautions Precautions: Fall;Other (comment) Precaution Comments: Chronic vertigo; HOH; monitor for orthostatic hypotension(MD order for thigh TEDs per MD 1/29 but not abd binder. Restrictions Weight Bearing Restrictions Per Provider Order: No     Mobility  Bed Mobility   Bed Mobility: Rolling, Sidelying to Sit, Sit to Supine Rolling: Supervision Sidelying to sit: Min assist   Sit to supine: Supervision   General bed mobility comments: min assist to get  trunk upright  from sidelying, able to place legs onto bed    Transfers Overall transfer level: Needs assistance Equipment used: Rolling walker (2 wheels) Transfers: Sit to/from Stand Sit to Stand: Contact guard assist, From elevated surface           General transfer comment: Stood for orthostatic BP's, ~ 2 "    Ambulation/Gait                   Stairs             Wheelchair Mobility     Tilt Bed    Modified Rankin (Stroke Patients Only)       Balance Overall balance assessment: Needs assistance Sitting-balance support: Feet supported, No upper extremity supported Sitting balance-Leahy Scale: Fair     Standing balance support: Bilateral upper extremity supported, During functional activity, Reliant on assistive device for balance Standing balance-Leahy Scale: Poor Standing balance comment: close S for static standing at RW with B UE support                            Cognition Arousal: Alert Behavior During Therapy: Flat affect Overall Cognitive Status: Within Functional Limits for tasks assessed                                 General Comments: Very HOH. Left ear hears better per patient.        Exercises  General Comments        Pertinent Vitals/Pain Pain Assessment Pain Assessment: No/denies pain    Home Living                          Prior Function            PT Goals (current goals can now be found in the care plan section) Progress towards PT goals: Not progressing toward goals - comment (orthostatic)    Frequency    Min 1X/week      PT Plan      Co-evaluation              AM-PAC PT "6 Clicks" Mobility   Outcome Measure  Help needed turning from your back to your side while in a flat bed without using bedrails?: A Little Help needed moving from lying on your back to sitting on the side of a flat bed without using bedrails?: A Little Help needed moving to and from a bed to a  chair (including a wheelchair)?: A Lot Help needed standing up from a chair using your arms (e.g., wheelchair or bedside chair)?: A Lot Help needed to walk in hospital room?: Total Help needed climbing 3-5 steps with a railing? : Total 6 Click Score: 12    End of Session Equipment Utilized During Treatment: Gait belt Activity Tolerance: Treatment limited secondary to medical complications (Comment) Patient left: in bed;with call bell/phone within reach;with bed alarm set Nurse Communication: Mobility status (BP drop) PT Visit Diagnosis: Other abnormalities of gait and mobility (R26.89);Difficulty in walking, not elsewhere classified (R26.2);Other symptoms and signs involving the nervous system (R29.898);Dizziness and giddiness (R42);Unsteadiness on feet (R26.81);History of falling (Z91.81)     Time: 1030-1100 PT Time Calculation (min) (ACUTE ONLY): 30 min  Charges:    $Therapeutic Activity: 23-37 mins PT General Charges $$ ACUTE PT VISIT: 1 Visit                     Blanchard Kelch PT Acute Rehabilitation Services Office [phone] Weekend pager-[phone]    Rada Hay 05/11/2023, 12:27 PM

## 2023-05-11 NOTE — Progress Notes (Signed)
TRH ROUNDING   NOTE John [name] WUJ:[id number]  DOB: 10/29/1945  DOA: 05/08/2023  PCP: Sharlene Dory, DO  05/11/2023,7:54 AM   LOS: 1 day      Code Status: Full code From: Home  current Dispo: Unclear likely rehab   78 year old male DM TY 2 with neuropathy, known hypertensive, BPH follows with Dr. Tobie Lords [facility] Male breast cancer on tamoxifen status post T2 M0 invasive carcinoma right breast--- managed by hematologist Dr. Guilford Shi [place] Recent admission 1/4 severe vertigo mild DKA ultimately found to have small acute infarct right middle cerebellar peduncle--seen by neurology placed on aspirin Plavix for 3 weeks then Plavix alone and discharged to inpatient rehab Stay.inpatient rehab until 1/25 and was discharged home   Events 1/26 represented to [facility] ED fall, weakness got dizzy and fell BP 78/42--sodium 138 BUN/creatinine 13/1.3 LFTs AST/ALT 49/47 WBC 10.4 RVP negative urinalysis negative Troponin 9 Dimer 1.6   procedures 1/26 CXR low lung volumes with bronchovascular crowding right midlung patchy opacity and left lower lung patchy opacity?  Atelectasis CT head no acute intracranial abnormality stable atrophy remote lacunar infarcts thalami bilaterally and inferior left caudate head MRI brain punctate acute infarct R and cerebellar peduncle remote lacunar infarcts bilateral thalami CT chest small volume occlusive nonocclusive pulmonary emboli right lung no heart strain small right pleural effusion additional subsegmental atelectasis CT cervical spine no acute fracture or traumatic subluxation    Plan  Syncope secondary to pulmonary embolism with DVT +/- Orthostatic hypotension with fall Is orthostatic today so we will need to start back stockings has received several days of Eliquis Continue at least 3 to 6 months of Eliquis and then transition to Plavix as per prior physician Worked with therapy today and became lightheaded so we will need to  stabilize from this perspective to be able to discharge to skilled Cerebellar stroke Found at last admission with predominant symptoms of vertigo Plavix to be resumed once completes Eliquis, continue atorvastatin 80 DM TY 2 A1c 12.4 recently Continues on Semglee insulin 10 units sliding scale with at bedtime coverage  CBGs are ranging 180s to 250s a increase in weight in the next 24 hours as usually takes 45 units twice daily Resume metformin 1000 twice daily today Breast cancer  needs outpatient follow-up in [place] continue tamoxifen-this patient sometimes has risk for DVTs-needs outpatient further discussion HTN  continues on amlodipine 5 but may have to discontinue-Lasix 80 has been held from admission BPH Continues on finasteride 5, Flomax 0.4 daily may need to discontinue Flomax if continues to be orthostatic Hepatic steatosis BMI 30 Reflux  No family present today-he is coherent and can relay message  DVT prophylaxis: Eliquis  Status is: Inpatient Remains inpatient appropriate because:   Requires further workup and probably require skilled      Subjective: Awake coherent pleasant no distress eating lunch no chest pain no fever got quite dizzy with getting up and just moving around Was quite orthostatic so we resumed the stockings  Objective + exam Vitals:   05/10/23 1938 05/10/23 2329 05/11/23 0347 05/11/23 0500  BP: 136/62 139/63 131/68   Pulse: 85 83 78   Resp: 20 19 19    Temp: 98.4 F (36.9 C) 98.6 F (37 C) 97.9 F (36.6 C)   TempSrc: Oral Oral Oral   SpO2: 94% 95% 95%   Weight:    98 kg  Height:       Filed Weights   05/09/23 1114 05/11/23 0500  Weight: 101.9 kg 98 kg    Examination: EOMI NCAT no focal deficit no icterus no pallor no wheeze no rales no rhonchi S1-S2 no murmur no rub no gallop Chest is clear ROM is intact Power is 5/5 He is coherent  Data Reviewed: reviewed   CBC    Component Value Date/Time   WBC 8.1 05/09/2023 0454    RBC 4.06 (L) 05/09/2023 0454   HGB 11.0 (L) 05/09/2023 0454   HCT 34.3 (L) 05/09/2023 0454   PLT 381 05/09/2023 0454   MCV 84.5 05/09/2023 0454   MCH 27.1 05/09/2023 0454   MCHC 32.1 05/09/2023 0454   RDW 15.7 (H) 05/09/2023 0454   LYMPHSABS 1.0 05/08/2023 1405   MONOABS 0.7 05/08/2023 1405   EOSABS 0.1 05/08/2023 1405   BASOSABS 0.1 05/08/2023 1405      Latest Ref Rng & Units 05/09/2023    4:54 AM 05/08/2023    2:05 PM 05/02/2023    4:56 AM  CMP  Glucose 70 - 99 mg/dL 960  454  098   BUN 8 - 23 mg/dL 14  13  13    Creatinine 0.61 - 1.24 mg/dL 1.19  1.47  8.29   Sodium 135 - 145 mmol/L 137  138  132   Potassium 3.5 - 5.1 mmol/L 3.8  4.6  4.7   Chloride 98 - 111 mmol/L 102  100  101   CO2 22 - 32 mmol/L 24  22  19    Calcium 8.9 - 10.3 mg/dL 8.7  9.9  9.0   Total Protein 6.5 - 8.1 g/dL 5.5  7.4  5.5   Total Bilirubin 0.0 - 1.2 mg/dL 1.0  0.9  1.7   Alkaline Phos 38 - 126 U/L 42  59  41   AST 15 - 41 U/L 42  49  48   ALT 0 - 44 U/L 45  47  36     Scheduled Meds:  apixaban  10 mg Oral BID   Followed by   Melene Muller ON 05/16/2023] apixaban  5 mg Oral BID   atorvastatin  80 mg Oral QHS   buPROPion  75 mg Oral BID   feeding supplement  237 mL Oral BID BM   finasteride  5 mg Oral Daily   insulin aspart  0-5 Units Subcutaneous QHS   insulin aspart  0-9 Units Subcutaneous TID WC   insulin glargine-yfgn  10 Units Subcutaneous Daily   pantoprazole  40 mg Oral Daily   polyethylene glycol  17 g Oral BID   sodium chloride flush  3 mL Intravenous Q12H   tamsulosin  0.4 mg Oral Daily   Continuous Infusions:  Time 45  Rhetta Mura, MD  [facility]

## 2023-05-11 NOTE — [place]2 (Signed)
[place] MEDICAID FL2 LEVEL OF CARE FORM     IDENTIFICATION  Patient Name: John [name] Birthdate: [birth year] Sex: male Admission Date (Current Location): 05/08/2023  [facility] and [place] Number:  Producer, television/film/video and Address:  [facility],  [address] [place], [place] [address]      Provider Number: [id number]  Attending Physician Name and Address:  Rhetta Mura, MD  Relative Name and Phone Number:  Roderic, Lammert (Spouse)  [phone]    Current Level of Care: Hospital Recommended Level of Care: Skilled Nursing Facility Prior Approval Number:    Date Approved/Denied:   PASRR Number: [id number]  Discharge Plan: SNF    Current Diagnoses: Patient Active Problem List   Diagnosis Date Noted   Syncope 05/10/2023   Fall at home, initial encounter 05/08/2023   Hypotension 05/08/2023   Abnormal LFTs 05/08/2023   Anemia 05/08/2023   Coping style affecting medical condition 05/02/2023   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) 04/28/2023   Brainstem infarct, acute (HCC) 04/25/2023   Pressure injury of skin 04/25/2023   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   Acute kidney injury 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) 04/17/2023   DKA (diabetic ketoacidosis) (HCC) 04/16/2023   Type 2 diabetes mellitus with hyperglycemia, with long-term current use of insulin (HCC) 07/26/2022   Bronchitis 03/20/2021   Acute non-recurrent frontal sinusitis 03/20/2021   Viral URI with cough 01/13/2021   Hyperlipidemia 11/21/2020   Dizziness and giddiness 10/20/2020   Localized swelling of both lower extremities 10/20/2020   Tendinitis of right hip flexor 10/20/2020   Aortic atherosclerosis (HCC) 09/10/2020   Insomnia 09/10/2020   Cognitive impairment 08/18/2020   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   Increasing residual urine 06/04/2020   History of COVID-19 12/10/2019   Physical deconditioning 12/10/2019   Fatigue  12/10/2019   Unsteady gait 12/10/2019   Pneumonia due to COVID-19 virus 10/26/2019   Peripheral neuropathy 10/17/2019   Depression, major, single episode, 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/2019   History of breast cancer    GERD (gastroesophageal reflux disease)    BPH (benign prostatic hyperplasia)    CTS (carpal tunnel syndrome)    Diabetic polyneuropathy associated with type 2 diabetes mellitus (HCC) 09/29/2019   Neuropathic pain of foot 09/29/2019   Port-A-Cath in place 01/03/2019   Peripheral sensory neuropathy 05/17/2018   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   Hypomagnesemia 01/18/2018   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) 12/13/2017   Axillary adenopathy 12/09/2017   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   S/P cataract extraction and insertion of intraocular lens 01/17/2017   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   PVD (posterior vitreous detachment), left 01/07/2017   Hyperopia of both eyes with astigmatism 11/09/2016   Class 1 obesity due to excess calories without serious comorbidity with body mass index (BMI) of 31.0 to 31.9 in adult 09/17/2016   Encounter for monitoring tamoxifen therapy 03/05/2016   Insulin dependent type 2 diabetes mellitus (HCC) 07/02/2015   Diabetes 1.5, 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   Palpitation 09/16/2014    Orientation RESPIRATION BLADDER Height & Weight     Self, Time, Situation, Place  Normal Continent Weight: 216 lb 0.8 oz (98 kg) Height:  6' (182.9 cm)  BEHAVIORAL SYMPTOMS/MOOD NEUROLOGICAL BOWEL NUTRITION STATUS      Continent Diet (See DC summary)  AMBULATORY STATUS COMMUNICATION OF NEEDS Skin   Limited Assist Verbally Other (Comment) (  irritant dermititis)                       Personal Care Assistance Level of Assistance  Bathing, Feeding, Dressing Bathing Assistance: Limited assistance Feeding assistance: Independent Dressing Assistance:  Limited assistance     Functional Limitations Info  Sight, Hearing, Speech Sight Info: Adequate Hearing Info: Impaired Speech Info: Adequate    SPECIAL CARE FACTORS FREQUENCY  PT (By licensed PT), OT (By licensed OT)     PT Frequency: 5x/wk OT Frequency: 5x/wk            Contractures Contractures Info: Not present    Additional Factors Info  Code Status, Allergies Code Status Info: FULL Allergies Info: Ramipril, Other, Sertraline, Adhesive (Tape)           Current Medications (05/11/2023):  This is the current hospital active medication list Current Facility-Administered Medications  Medication Dose Route Frequency Provider Last Rate Last Admin   acetaminophen (TYLENOL) tablet 650 mg  650 mg Oral Q6H PRN Gertha Calkin, MD       Or   acetaminophen (TYLENOL) suppository 650 mg  650 mg Rectal Q6H PRN Gertha Calkin, MD       apixaban Everlene Balls) tablet 10 mg  10 mg Oral BID Jamse Mead, RPH   10 mg at 05/11/23 0815   Followed by   Melene Muller ON 05/16/2023] apixaban (ELIQUIS) tablet 5 mg  5 mg Oral BID Gadhia, Jigna M, RPH       atorvastatin (LIPITOR) tablet 80 mg  80 mg Oral QHS Zigmund Daniel., MD   80 mg at 05/10/23 2019   buPROPion [facility]) tablet 75 mg  75 mg Oral BID Gertha Calkin, MD   75 mg at 05/11/23 0816   feeding supplement (ENSURE ENLIVE / ENSURE PLUS) liquid 237 mL  237 mL Oral BID BM Rhetta Mura, MD       finasteride (PROSCAR) tablet 5 mg  5 mg Oral Daily Zigmund Daniel., MD   5 mg at 05/11/23 0815   guaiFENesin-dextromethorphan (ROBITUSSIN DM) 100-10 MG/5ML syrup 5 mL  5 mL Oral Q4H PRN Zigmund Daniel., MD   5 mL at 05/11/23 0435   insulin aspart (novoLOG) injection 0-5 Units  0-5 Units Subcutaneous QHS Zigmund Daniel., MD   2 Units at 05/10/23 2141   insulin aspart (novoLOG) injection 0-9 Units  0-9 Units Subcutaneous TID WC Zigmund Daniel., MD   2 Units at 05/11/23 0816   insulin glargine-yfgn (SEMGLEE) injection  10 Units  10 Units Subcutaneous Daily Zigmund Daniel., MD   10 Units at 05/11/23 0816   metFORMIN (GLUCOPHAGE-XR) 24 hr tablet 1,000 mg  1,000 mg Oral BID WC Rhetta Mura, MD       pantoprazole (PROTONIX) EC tablet 40 mg  40 mg Oral Daily Zigmund Daniel., MD   40 mg at 05/11/23 0815   polyethylene glycol (MIRALAX / GLYCOLAX) packet 17 g  17 g Oral BID Zigmund Daniel., MD       sodium chloride flush (NS) 0.9 % injection 3 mL  3 mL Intravenous Q12H Irena Cords V, MD   3 mL at 05/11/23 0818   tamsulosin (FLOMAX) capsule 0.4 mg  0.4 mg Oral Daily Zigmund Daniel., MD   0.4 mg at 05/11/23 1610     Discharge Medications: Please see discharge summary for a list of discharge medications.  Relevant  Imaging Results:  Relevant Lab Results:   Additional Information SSN [id number]  Otelia Santee, LCSW

## 2023-05-12 ENCOUNTER — Other Ambulatory Visit (HOSPITAL_COMMUNITY): Payer: Self-pay

## 2023-05-12 ENCOUNTER — Telehealth (HOSPITAL_COMMUNITY): Payer: Self-pay | Admitting: Pharmacy Technician

## 2023-05-12 DIAGNOSIS — I2699 Other pulmonary embolism without acute cor pulmonale: Secondary | ICD-10-CM | POA: Diagnosis not present

## 2023-05-12 DIAGNOSIS — E43 Unspecified severe protein-calorie malnutrition: Secondary | ICD-10-CM | POA: Insufficient documentation

## 2023-05-12 DIAGNOSIS — I442 Atrioventricular block, complete: Secondary | ICD-10-CM | POA: Diagnosis not present

## 2023-05-12 LAB — GLUCOSE, CAPILLARY
Glucose-Capillary: 213 mg/dL — ABNORMAL HIGH (ref 70–99)
Glucose-Capillary: 174 mg/dL — ABNORMAL HIGH (ref 70–99)
Glucose-Capillary: 163 mg/dL — ABNORMAL HIGH (ref 70–99)
Glucose-Capillary: 133 mg/dL — ABNORMAL HIGH (ref 70–99)
Glucose-Capillary: 194 mg/dL — ABNORMAL HIGH (ref 70–99)
Glucose-Capillary: 154 mg/dL — ABNORMAL HIGH (ref 70–99)

## 2023-05-12 LAB — APTT
aPTT: 130 s — ABNORMAL HIGH (ref 24–36)
aPTT: 47 s — ABNORMAL HIGH (ref 24–36)

## 2023-05-12 LAB — HEPARIN LEVEL (UNFRACTIONATED): Heparin Unfractionated: >1.1 [IU]/mL — ABNORMAL HIGH (ref 0.30–0.70)

## 2023-05-12 NOTE — Consult Note (Addendum)
ELECTROPHYSIOLOGY CONSULT NOTE    Patient ID: John [name] MRN: [id number], DOB/AGE: 77-Sep-1947 77 y.o.  Admit date: 05/08/2023 Date of Consult: 05/12/2023  Primary Physician: Sharlene Dory, DO Primary Cardiologist: None  Electrophysiologist: Dr. Jimmey Ralph, new consult 1/30  Referring Provider: Dr. Royann Shivers Patient Profile: John [name] is a 78 y.o. male with a history of RBBB, HTN, breast cancer s/p radical mastectomy on tamoxifen, vertigo, chronic small vessel infarcts, DM II, and BPH who is being seen today for the evaluation of complete heart block at the request of Dr Royann Shivers.  HPI:  John [name] is a 78 y.o. male who was admitted to [facility] hospital on 1/26 after a fall/syncopal episode in his bathroom.  The patient was admitted 04/17/23 with an acute right MCA peduncle CVA and was discharged to rehab on 1/10.  He subsequently was discharged home on 05/08/2023.   He returned to the ER at [facility] after fall/syncopal episode in the bathroom.  Initial workup notable for hypotension and AKI.  Labs-CR of 1.32, troponin 9, platelets 461, AST 49, ALT 47.  CT head was negative for acute abnormality.  MRI was unchanged from prior known CVA on 1/7.  D-dimer was elevated at 1.63 and he underwent a CT angio chest which showed a small volume occlusive and nonocclusive PE in the right lower lobe pulmonary artery branches without right heart strain.  Noted segmental right lower lobe infarct and small right pleural effusion.  After discussion with neurology he was started on Eliquis and Plavix was stopped.  On 1/29 he developed sudden bradycardia with a heart rate of 30 bpm.  This was associated with hypotension.  He noted that symptoms felt similar to prior episode at home in the bathroom.  ECG was reported as a stable wide-complex rhythm with a rate of 29 to 30 bpm consistent with an idioventricular escape.  He was transferred to [facility] for temporary pacing wire.   EP consulted for evaluation.  He denies chest pain, palpitations, dyspnea, PND, orthopnea, nausea, vomiting, dizziness, syncope, edema, weight gain, or early satiety.   Labs Potassium4.0 (01/29 4098) Magnesium  1.6* (01/29 0708) Creatinine, ser  0.88 (01/29 0708) PLT  380 (01/29 0708) HGB  11.9* (01/29 0708) WBC 9.8 (01/29 0708)  .    Past Medical History:  Diagnosis Date   BPH (benign prostatic hyperplasia)    CTS (carpal tunnel syndrome)    Depression    Diabetes mellitus    Essential hypertension    GERD (gastroesophageal reflux disease)    History of breast cancer    Hypercholesteremia    Neuropathy      Surgical History:  Past Surgical History:  Procedure Laterality Date   CATARACT EXTRACTION, BILATERAL  01/18/2017   LITHOTRIPSY     MASTECTOMY Right 05/02/2018   TEMPORARY PACEMAKER N/A 05/11/2023   Procedure: TEMPORARY PACEMAKER;  Surgeon: [place], Peter M, MD;  Location: [facility] INVASIVE CV LAB;  Service: Cardiovascular;  Laterality: N/A;   TONSILLECTOMY AND ADENOIDECTOMY       Medications Prior to Admission  Medication Sig Dispense Refill Last Dose/Taking   acetaminophen (TYLENOL) 650 MG CR tablet Take 650-1,300 mg by mouth every 8 (eight) hours as needed for pain.   Past Week   amLODipine (NORVASC) 5 MG tablet Take 1 tablet (5 mg total) by mouth daily. 30 tablet 0 05/08/2023   Ascorbic Acid (VITAMIN C) 1000 MG tablet Take 1,000 mg by mouth 3 (three) times a week.  Past Week   aspirin EC 81 MG tablet Swallow whole daily until 05/11/2023 and stop.   05/08/2023 Morning   atorvastatin (LIPITOR) 80 MG tablet Take 1 tablet (80 mg total) by mouth at bedtime. 30 tablet 0 05/07/2023   buPROPion (WELLBUTRIN) 75 MG tablet Take 1 tablet (75 mg total) by mouth 2 (two) times daily. 60 tablet 0 05/08/2023 Morning   clopidogrel (PLAVIX) 75 MG tablet Take 1 tablet (75 mg total) by mouth daily. 30 tablet 0 05/08/2023   finasteride (PROSCAR) 5 MG tablet Take 1 tablet (5 mg total) by mouth  daily. 30 tablet 0 05/08/2023   furosemide (LASIX) 80 MG tablet Take 1 tablet (80 mg total) by mouth daily. 30 tablet 0 05/08/2023   guaiFENesin (ROBITUSSIN) 100 MG/5ML liquid Take 10 mLs by mouth every 6 (six) hours as needed for cough or to loosen phlegm.   Taking As Needed   insulin glargine, 2 Unit Dial, (TOUJEO MAX) 300 UNIT/ML Solostar Pen Inject 45 units before breakfast and supper. 9 mL 0 05/08/2023 Morning   meclizine (ANTIVERT) 12.5 MG tablet Take 1 tablet (12.5 mg total) by mouth 3 (three) times daily as needed for dizziness. 30 tablet 0 Past Week   metFORMIN (GLUCOPHAGE-XR) 500 MG 24 hr tablet Take 2 tablets (1,000 mg total) by mouth 2 (two) times daily with a meal. 120 tablet 0 05/08/2023 Morning   Multiple Vitamin (MULTIVITAMIN) tablet Take 1 tablet by mouth 2 (two) times a week.   Past Week   pantoprazole (PROTONIX) 40 MG tablet Take 1 tablet (40 mg total) by mouth daily. (Take in place of omeprazole while taking clopidogrel.) 30 tablet 0 05/08/2023   polyethylene glycol (MIRALAX / GLYCOLAX) 17 g packet Take 17 g by mouth 2 (two) times daily. 14 each 0 05/08/2023 Morning   potassium chloride SA (KLOR-CON M) 20 MEQ tablet Take 2 tablets (40 mEq total) by mouth daily. 30 tablet 0 05/08/2023   tamoxifen (NOLVADEX) 20 MG tablet Take 20 mg by mouth daily.   05/08/2023   tamsulosin (FLOMAX) 0.4 MG CAPS capsule Take 1 capsule (0.4 mg total) by mouth daily. 30 capsule 0 05/08/2023   Triamcinolone Acetonide (TRIAMCINOLONE 0.1 % CREAM : EUCERIN) CREA Apply 1 Application topically 2 (two) times daily. 1 each 0 Past Week   Blood Glucose Monitoring Suppl (ACCU-CHEK GUIDE) w/Device KIT Use daily to check blood sugar.  DX E11.69 1 kit 0    insulin lispro (HUMALOG) 200 UNIT/ML KwikPen Inject 25 Units into the skin daily at 6 (six) AM. 3 mL 0    Lancets Misc. (ACCU-CHEK SOFTCLIX LANCET DEV) KIT Use daily to check blood sugar.  DX E11.69       Inpatient Medications:   atorvastatin  80 mg Oral QHS   buPROPion   75 mg Oral BID   Chlorhexidine Gluconate Cloth  6 each Topical Daily   feeding supplement  237 mL Oral BID BM   finasteride  5 mg Oral Daily   insulin aspart  0-5 Units Subcutaneous QHS   insulin aspart  0-9 Units Subcutaneous TID WC   insulin glargine-yfgn  10 Units Subcutaneous Daily   metFORMIN  1,000 mg Oral BID WC   multivitamin with minerals  1 tablet Oral Daily   pantoprazole  40 mg Oral Daily   polyethylene glycol  17 g Oral BID   sodium chloride flush  3 mL Intravenous Q12H   tamsulosin  0.4 mg Oral Daily    Allergies:  Allergies  Allergen  Reactions   Ramipril Anaphylaxis   Other Diarrhea    Severe intolerance to Chemotherapy in the past.   Sertraline Other (See Comments)    "Extreme headaches"   Adhesive [Tape] Rash    Family History  Problem Relation Age of Onset   Lymphoma Mother    Cancer Mother    Diabetes Father    Stroke Father    Ovarian cancer Sister    Cancer Sister    Cancer Paternal Grandmother      Physical Exam: Vitals:   05/12/23 0700 05/12/23 0800 05/12/23 0900 05/12/23 1000  BP: 133/66 123/75 (!) 144/70 128/75  Pulse: 68 78 77 79  Resp: 20 (!) 21 (!) 22 (!) 24  Temp: 98.5 F (36.9 C)     TempSrc: Oral     SpO2: 93% 93% 90% 91%  Weight:      Height:        GEN- NAD, A&O x 3, normal affect HEENT: Normocephalic, atraumatic, R internal jugular temp pacing wire in place Lungs- CTAB, Normal effort.  Heart- Regular rate and rhythm, No M/G/R.  GI- Soft, NT, ND.  Extremities- No clubbing, cyanosis, or edema   Radiology/Studies: CARDIAC CATHETERIZATION Result Date: 05/11/2023 Successful placement of temporary transvenous pacemaker.   ECHOCARDIOGRAM LIMITED Result Date: 05/10/2023    ECHOCARDIOGRAM LIMITED REPORT   Patient Name:   KRIKOR WILLET Alto Date of Exam: 05/10/2023 Medical Rec #:  [id number]        Height:       72.0 in Accession #:    [id number]       Weight:       224.6 lb Date of Birth:  Jun 22, 1945        BSA:          2.238 m  Patient Age:    77 years         BP:           146/75 mmHg Patient Gender: M                HR:           79 bpm. Exam Location:  Inpatient Procedure: Limited Echo, Cardiac Doppler and Limited Color Doppler Indications:    Pulmonary Embolism  History:        Patient has prior history of Echocardiogram examinations, most                 recent 04/21/2023. Stroke, Signs/Symptoms:Hypotension; Risk                 Factors:Diabetes and Breast Cancer.  Sonographer:    Amy Chionchio Referring Phys: [id number] A CALDWELL POWELL JR IMPRESSIONS  1. Left ventricular ejection fraction, by estimation, is 60 to 65%. The left ventricle has normal function. The left ventricle has no regional wall motion abnormalities. Left ventricular diastolic function could not be evaluated.  2. Right ventricular systolic function is normal. The right ventricular size is normal. There is mildly elevated pulmonary artery systolic pressure. The estimated right ventricular systolic pressure is 38.0 mmHg.  3. The mitral valve is normal in structure. No evidence of mitral valve regurgitation. No evidence of mitral stenosis.  4. The aortic valve is tricuspid. There is mild calcification of the aortic valve. Aortic valve regurgitation is not visualized. No aortic stenosis is present.  5. The inferior vena cava is dilated in size with >50% respiratory variability, suggesting right atrial pressure of 8 mmHg. Conclusion(s)/Recommendation(s): Technically very limited study due to poor sound  wave transmission. LV/RV look grossly normal. FINDINGS  Left Ventricle: Left ventricular ejection fraction, by estimation, is 60 to 65%. The left ventricle has normal function. The left ventricle has no regional wall motion abnormalities. The left ventricular internal cavity size was normal in size. There is  no left ventricular hypertrophy. Left ventricular diastolic function could not be evaluated. Right Ventricle: The right ventricular size is normal. No increase in right  ventricular wall thickness. Right ventricular systolic function is normal. There is mildly elevated pulmonary artery systolic pressure. The tricuspid regurgitant velocity is 2.74  m/s, and with an assumed right atrial pressure of 8 mmHg, the estimated right ventricular systolic pressure is 38.0 mmHg. Left Atrium: Left atrial size was normal in size. Right Atrium: Right atrial size was normal in size. Pericardium: There is no evidence of pericardial effusion. Mitral Valve: The mitral valve is normal in structure. No evidence of mitral valve stenosis. Tricuspid Valve: The tricuspid valve is normal in structure. Tricuspid valve regurgitation is mild . No evidence of tricuspid stenosis. Aortic Valve: The aortic valve is tricuspid. There is mild calcification of the aortic valve. Aortic valve regurgitation is not visualized. No aortic stenosis is present. Pulmonic Valve: The pulmonic valve was normal in structure. Pulmonic valve regurgitation is trivial. No evidence of pulmonic stenosis. Aorta: The aortic root is normal in size and structure. Venous: The inferior vena cava is dilated in size with greater than 50% respiratory variability, suggesting right atrial pressure of 8 mmHg. IAS/Shunts: No atrial level shunt detected by color flow Doppler. RIGHT VENTRICLE          IVC RV Basal diam:  4.10 cm  IVC diam: 2.00 cm TAPSE (M-mode): 1.9 cm RIGHT ATRIUM           Index RA Area:     16.40 cm RA Volume:   44.30 ml  19.79 ml/m  PULMONIC VALVE PV Vmax:          0.88 m/s PV Peak grad:     3.1 mmHg PR End Diast Vel: 7.18 msec  TRICUSPID VALVE TR Peak grad:   30.0 mmHg TR Vmax:        274.00 cm/s Arvilla Meres MD Electronically signed by Arvilla Meres MD Signature Date/Time: 05/10/2023/12:01:37 PM    Final    VAS [place] LOWER EXTREMITY VENOUS (DVT) Result Date: 05/09/2023  Lower Venous DVT Study Patient Name:  HUBERT RAATZ Dominey  Date of Exam:   05/09/2023 Medical Rec #: [id number]         Accession #:    [id number] Date of  Birth: 1945-10-21         Patient Gender: M Patient Age:   53 years Exam Location:  [facility] Procedure:      VAS [place] LOWER EXTREMITY VENOUS (DVT) Referring Phys: A POWELL JR --------------------------------------------------------------------------------  Indications: Edema.  Risk Factors: Hx of breast cancer on tamoxifen. Comparison Study: No previous exams Performing Technologist: Jody Hill RVT, RDMS  Examination Guidelines: A complete evaluation includes B-mode imaging, spectral Doppler, color Doppler, and power Doppler as needed of all accessible portions of each vessel. Bilateral testing is considered an integral part of a complete examination. Limited examinations for reoccurring indications may be performed as noted. The reflux portion of the exam is performed with the patient in reverse Trendelenburg.  +---------+---------------+---------+-----------+----------+--------------+ RIGHT    CompressibilityPhasicitySpontaneityPropertiesThrombus Aging +---------+---------------+---------+-----------+----------+--------------+ CFV      Full           Yes  Yes                                 +---------+---------------+---------+-----------+----------+--------------+ SFJ      Full                                                        +---------+---------------+---------+-----------+----------+--------------+ FV Prox  Full           Yes      Yes                                 +---------+---------------+---------+-----------+----------+--------------+ FV Mid   Full           Yes      Yes                                 +---------+---------------+---------+-----------+----------+--------------+ FV DistalFull           Yes      Yes                                 +---------+---------------+---------+-----------+----------+--------------+ PFV      Full                                                         +---------+---------------+---------+-----------+----------+--------------+ POP      Full           Yes      Yes                                 +---------+---------------+---------+-----------+----------+--------------+ PTV      Full                                                        +---------+---------------+---------+-----------+----------+--------------+ PERO     Full                                                        +---------+---------------+---------+-----------+----------+--------------+   +---------+----------------+---------+-----------+----------+-----------------+ LEFT     Compressibility PhasicitySpontaneityPropertiesThrombus Aging    +---------+----------------+---------+-----------+----------+-----------------+ CFV      Partial         Yes      Yes                  Acute             +---------+----------------+---------+-----------+----------+-----------------+ SFJ      Partial  Acute             +---------+----------------+---------+-----------+----------+-----------------+ FV Prox  Full            Yes      Yes                                    +---------+----------------+---------+-----------+----------+-----------------+ FV Mid   Full            No       Yes                                    +---------+----------------+---------+-----------+----------+-----------------+ FV DistalFull            Yes      Yes                                    +---------+----------------+---------+-----------+----------+-----------------+ PFV      Full                                                            +---------+----------------+---------+-----------+----------+-----------------+ POP      Full            Yes      Yes                                    +---------+----------------+---------+-----------+----------+-----------------+ PTV      Full                                                             +---------+----------------+---------+-----------+----------+-----------------+ PERO     Full                                                            +---------+----------------+---------+-----------+----------+-----------------+ EIV      Partial (distal)Yes      Yes                  Age Indeterminate +---------+----------------+---------+-----------+----------+-----------------+ Unable to visualized CIV due to habitus / bowel gas    Summary: BILATERAL: -No evidence of popliteal cyst, bilaterally. RIGHT: - There is no evidence of deep vein thrombosis in the lower extremity.  LEFT: - Findings consistent with acute deep vein thrombosis involving the left common femoral vein, and SF junction.  - Findings consistent with age indeterminate deep vein thrombosis involving the external iliac vein.   *See table(s) above for measurements and observations. Electronically signed by Gerarda Fraction on 05/09/2023 at 5:03:50 PM.    Final    CT Angio Chest Pulmonary Embolism (PE) W or WO Contrast Addendum Date: 05/09/2023 ADDENDUM REPORT: 05/09/2023 09:49 ADDENDUM: Critical Value/emergent results  were called by telephone at the time of interpretation on 05/09/2023 at 9:49 am to Dr. Rose Phi , who verbally acknowledged these results. Electronically Signed   By: Jules Schick M.D.   On: 05/09/2023 09:49   Result Date: 05/09/2023 CLINICAL DATA:  Pulmonary embolism (PE) suspected, low to intermediate prob, positive D-dimer. Weakness. Fall. EXAM: CT ANGIOGRAPHY CHEST WITH CONTRAST TECHNIQUE: Multidetector CT imaging of the chest was performed using the standard protocol during bolus administration of intravenous contrast. Multiplanar CT image reconstructions and MIPs were obtained to evaluate the vascular anatomy. RADIATION DOSE REDUCTION: This exam was performed according to the departmental dose-optimization program which includes automated exposure control, adjustment of the mA and/or kV  according to patient size and/or use of iterative reconstruction technique. CONTRAST:  75mL OMNIPAQUE IOHEXOL 350 MG/ML SOLN COMPARISON:  CT scan chest from 08/29/2020. FINDINGS: Cardiovascular: There is small volume pulmonary embolism. There is nonocclusive pulmonary embolism in the right lower lobe lobar artery and occlusive embolism in the right lower lobe segmental pulmonary arteries. No right heart strain. There are segmental heterogeneous opacities in the right lung lower lobe which enhances less than other lung parenchyma and is compatible with lung infarction. Normal cardiac size. No pericardial effusion. No aortic aneurysm. There are coronary artery calcifications, in keeping with coronary artery disease. There are also mild peripheral atherosclerotic vascular calcifications of thoracic aorta and its major branches. Mediastinum/Nodes: Visualized thyroid gland appears grossly unremarkable. No solid / cystic mediastinal masses. The esophagus is nondistended precluding optimal assessment. No axillary, mediastinal or hilar lymphadenopathy by size criteria. Lungs/Pleura: The central tracheo-bronchial tree is patent. There is new small right pleural effusion with associated compressive atelectatic changes in the right lower lobe. There are additional subsegmental atelectatic changes in the right upper lobe and left lung lower lobe. No lung mass or pneumothorax. No suspicious lung nodules. No left pleural effusion. Redemonstration of peripheral/subpleural opacity with associated linear areas of scarring/atelectasis in the right lung upper lobe, anteriorly, nonspecific but commonly seen in patients with prior radiation. Upper Abdomen: Small volume cholelithiasis without acute cholecystitis. There are scattered calcified granulomas in the spleen. Visualized upper abdominal viscera within normal limits. Musculoskeletal: The visualized soft tissues of the chest wall are grossly unremarkable. No suspicious osseous  lesions. There are mild multilevel degenerative changes in the visualized spine. Review of the MIP images confirms the above findings. IMPRESSION: 1. There is small volume occlusive and nonocclusive pulmonary embolism in the right lung lower lobe pulmonary artery branches, as described in detail above. No right heart strain. 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 2. There is small right pleural effusion and additional areas of subsegmental atelectasis in the right upper lobe and left lung lower lobe. 3. Multiple other nonacute observations, as described above. Aortic Atherosclerosis (ICD10-I70.0). Electronically Signed: By: Jules Schick M.D. On: 05/09/2023 09:39   CT CERVICAL SPINE WO CONTRAST Result Date: 05/09/2023 CLINICAL DATA:  Neck trauma.  Mechanical fall EXAM: CT CERVICAL SPINE WITHOUT CONTRAST TECHNIQUE: Multidetector CT imaging of the cervical spine was performed without intravenous contrast. Multiplanar CT image reconstructions were also generated. RADIATION DOSE REDUCTION: This exam was performed according to the departmental dose-optimization program which includes automated exposure control, adjustment of the mA and/or kV according to patient size and/or use of iterative reconstruction technique. COMPARISON:  MRI the cervical spine 10/23/2015. FINDINGS: Alignment: No significant listhesis is present. Straightening of the normal cervical lordosis is again seen. Skull base and vertebrae: The craniocervical junction is  normal. Vertebral body heights are normal. No acute fractures are present. Soft tissues and spinal canal: No prevertebral fluid or swelling. No visible canal hematoma. Disc levels: Mild degenerative changes are present in the lower cervical spine. No focal stenosis is present. Upper chest: The lung apices are clear. The thoracic inlet is within normal limits. IMPRESSION: 1. No acute fracture or traumatic subluxation. 2. Mild degenerative changes in the  lower cervical spine without focal stenosis. Electronically Signed   By: Marin Roberts M.D.   On: 05/09/2023 09:43   MR BRAIN WO CONTRAST Result Date: 05/09/2023 CLINICAL DATA:  Syncope/presyncope, cerebrovascular cause suspected EXAM: MRI HEAD WITHOUT CONTRAST TECHNIQUE: Multiplanar, multiecho pulse sequences of the brain and surrounding structures were obtained without intravenous contrast. COMPARISON:  CT head 05/08/2023. FINDINGS: Brain: Punctate acute infarct in the right middle cerebellar peduncle. Moderate patchy T2/FLAIR hyperintensities in the white matter nonspecific but compatible with chronic microvascular ischemic disease. Cerebral atrophy. No evidence of acute hemorrhage, mass lesion or midline shift. Remote infarcts in bilateral thalami in the pons. Vascular: Major arterial flow voids are maintained Skull and upper cervical spine: Normal marrow signal. Sinuses/Orbits: Paranasal sinus mucosal thickening. No acute orbital findings. Other: No mastoid effusions. IMPRESSION: 1. Punctate acute infarct in the right middle cerebellar peduncle. 2. 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 Electronically Signed   By: Feliberto Harts M.D.   On: 05/09/2023 00:31   CT Head Wo Contrast Result Date: 05/08/2023 CLINICAL DATA:  Head trauma, minor.  Weakness.  Fall. EXAM: CT HEAD WITHOUT CONTRAST TECHNIQUE: Contiguous axial images were obtained from the base of the skull through the vertex without intravenous contrast. RADIATION DOSE REDUCTION: This exam was performed according to the departmental dose-optimization program which includes automated exposure control, adjustment of the mA and/or kV according to patient size and/or use of iterative reconstruction technique. COMPARISON:  CT head without contrast 04/20/2023 FINDINGS: Brain: Moderate atrophy and white matter disease is stable. Remote lacunar infarcts are again noted within the thalami bilaterally. A remote lacunar infarct is  present in the inferior left caudate head. No acute cortical infarct or significant interval change is present. The ventricles are proportionate to the degree of atrophy. No significant extraaxial fluid collection is present. No acute hemorrhage or mass lesion is present. Midline structures are within normal limits. The brainstem and cerebellum are within normal limits. Vascular: Atherosclerotic calcifications are present within the cavernous internal carotid arteries bilaterally. No hyperdense vessel is present. Skull: Calvarium is intact. No focal lytic or blastic lesions are present. No significant extracranial soft tissue lesion is present. Sinuses/Orbits: Minimal fluid is present in the maxillary sinuses bilaterally. Scattered ethmoid opacification is present. The paranasal sinuses and mastoid air cells are otherwise clear. Bilateral lens replacements are noted. Globes and orbits are otherwise unremarkable. IMPRESSION: 1. No acute intracranial abnormality or significant interval change. 2. Stable atrophy and white matter disease likely reflects the sequela of chronic microvascular ischemia. 3. Remote lacunar infarcts of the thalami bilaterally and inferior left caudate head. 4. Minimal bilateral maxillary and ethmoid sinus disease. Electronically Signed   By: Marin Roberts M.D.   On: 05/08/2023 15:35   DG Chest 2 View Result Date: 05/08/2023 CLINICAL DATA:  Dizziness and fall EXAM: CHEST - 2 VIEW COMPARISON:  Chest radiograph dated 04/28/2023 FINDINGS: Patient is rotated to the right. Low lung volumes with bronchovascular crowding. Right mid lung linear and patchy opacity. Left lower lung patchy opacity. Small right pleural effusion. No pneumothorax. The heart size  and mediastinal contours are within normal limits. No radiographic finding of acute displaced fracture. IMPRESSION: 1. Low lung volumes with bronchovascular crowding. Right mid lung linear and patchy opacity and left lower lung patchy  opacity may reflect atelectasis, aspiration, or pneumonia. 2. Small right pleural effusion. 3.  No radiographic finding of acute displaced fracture. Electronically Signed   By: Agustin Cree M.D.   On: 05/08/2023 14:19   DG Chest 2 View Result Date: 04/28/2023 CLINICAL DATA:  Cough. EXAM: CHEST - 2 VIEW COMPARISON:  12/10/2019. FINDINGS: The heart size and mediastinal contours are within normal limits. No consolidation, effusion, or pneumothorax. Degenerative changes are present in the thoracic spine. No acute osseous abnormality is seen. IMPRESSION: No active cardiopulmonary disease. Electronically Signed   By: Thornell Sartorius M.D.   On: 04/28/2023 20:14   ECHOCARDIOGRAM COMPLETE Result Date: 04/21/2023    ECHOCARDIOGRAM REPORT   Patient Name:   ZEBULUN DEMAN Harries Date of Exam: 04/21/2023 Medical Rec #:  [id number]        Height:       73.0 in Accession #:    [id number]       Weight:       230.8 lb Date of Birth:  May 09, 1945        BSA:          2.287 m Patient Age:    77 years         BP:           138/61 mmHg Patient Gender: M                HR:           89 bpm. Exam Location:  Inpatient Procedure: 2D Echo, Cardiac Doppler, Color Doppler and Intracardiac            Opacification Agent Indications:    Stroke  History:        Patient has prior history of Echocardiogram examinations, most                 recent 11/14/2020. Signs/Symptoms:Edema and Fatigue; Risk                 Factors:Hypertension, Diabetes and Dyslipidemia.  Sonographer:    Vern Claude Referring Phys: [id number] SRISHTI L BHAGAT  Sonographer Comments: Image acquisition challenging due to patient body habitus. IMPRESSIONS  1. Left ventricular ejection fraction, by estimation, is 60 to 65%. Left ventricular ejection fraction by 2D MOD biplane is 61.9 %. The left ventricle has normal function. The left ventricle has no regional wall motion abnormalities. Left ventricular diastolic parameters are consistent with Grade I diastolic dysfunction (impaired  relaxation).  2. Right ventricular systolic function is normal. The right ventricular size is normal. There is normal pulmonary artery systolic pressure. The estimated right ventricular systolic pressure is 25.7 mmHg.  3. The mitral valve is grossly normal. No evidence of mitral valve regurgitation.  4. The aortic valve is tricuspid. Aortic valve regurgitation is not visualized. Aortic valve sclerosis/calcification is present, without any evidence of aortic stenosis. Comparison(s): No significant change from prior study. 11/14/2020: LVEF 60-65%. FINDINGS  Left Ventricle: Left ventricular ejection fraction, by estimation, is 60 to 65%. Left ventricular ejection fraction by 2D MOD biplane is 61.9 %. The left ventricle has normal function. The left ventricle has no regional wall motion abnormalities. The left ventricular internal cavity size was normal in size. There is no left ventricular hypertrophy. Left ventricular diastolic parameters are consistent with Grade I  diastolic dysfunction (impaired relaxation). Indeterminate filling pressures. Right Ventricle: The right ventricular size is normal. No increase in right ventricular wall thickness. Right ventricular systolic function is normal. There is normal pulmonary artery systolic pressure. The tricuspid regurgitant velocity is 2.38 m/s, and  with an assumed right atrial pressure of 3 mmHg, the estimated right ventricular systolic pressure is 25.7 mmHg. Left Atrium: Left atrial size was normal in size. Right Atrium: Right atrial size was normal in size. Pericardium: There is no evidence of pericardial effusion. Mitral Valve: The mitral valve is grossly normal. No evidence of mitral valve regurgitation. MV peak gradient, 5.9 mmHg. The mean mitral valve gradient is 2.0 mmHg. Tricuspid Valve: The tricuspid valve is grossly normal. Tricuspid valve regurgitation is trivial. Aortic Valve: The aortic valve is tricuspid. Aortic valve regurgitation is not visualized. Aortic  valve sclerosis/calcification is present, without any evidence of aortic stenosis. Aortic valve mean gradient measures 3.0 mmHg. Aortic valve peak gradient measures 4.4 mmHg. Aortic valve area, by VTI measures 2.96 cm. Pulmonic Valve: The pulmonic valve was grossly normal. Pulmonic valve regurgitation is trivial. Aorta: The aortic root and ascending aorta are structurally normal, with no evidence of dilitation. IAS/Shunts: No atrial level shunt detected by color flow Doppler.  LEFT VENTRICLE PLAX 2D                        Biplane EF (MOD) LVIDd:         4.50 cm         LV Biplane EF:   Left LVIDs:         2.90 cm                          ventricular LV PW:         0.70 cm                          ejection LV IVS:        0.90 cm                          fraction by LVOT diam:     2.00 cm                          2D MOD LV SV:         63                               biplane is LV SV Index:   27                               61.9 %. LVOT Area:     3.14 cm                                Diastology                                LV e' medial:    5.59 cm/s LV Volumes (MOD)               LV E/e' medial:  12.1 LV  vol d, MOD    88.1 ml       LV e' lateral:   8.70 cm/s A2C:                           LV E/e' lateral: 7.8 LV vol d, MOD    88.4 ml A4C: LV vol s, MOD    30.1 ml A2C: LV vol s, MOD    36.7 ml A4C: LV SV MOD A2C:   58.0 ml LV SV MOD A4C:   88.4 ml LV SV MOD BP:    55.5 ml RIGHT VENTRICLE RV Basal diam:  3.70 cm RV Mid diam:    2.20 cm RV S prime:     13.40 cm/s TAPSE (M-mode): 3.2 cm LEFT ATRIUM             Index        RIGHT ATRIUM           Index LA diam:        3.60 cm 1.57 cm/m   RA Area:     12.30 cm LA Vol (A2C):   36.9 ml 16.14 ml/m  RA Volume:   25.20 ml  11.02 ml/m LA Vol (A4C):   42.5 ml 18.59 ml/m LA Biplane Vol: 43.7 ml 19.11 ml/m  AORTIC VALVE                    PULMONIC VALVE AV Area (Vmax):    2.85 cm     PV Vmax:          0.95 m/s AV Area (Vmean):   2.59 cm     PV Peak grad:     3.6 mmHg  AV Area (VTI):     2.96 cm     PR End Diast Vel: 1.08 msec AV Vmax:           105.00 cm/s AV Vmean:          77.400 cm/s AV VTI:            0.212 m AV Peak Grad:      4.4 mmHg AV Mean Grad:      3.0 mmHg LVOT Vmax:         95.20 cm/s LVOT Vmean:        63.700 cm/s LVOT VTI:          0.200 m LVOT/AV VTI ratio: 0.94  AORTA Ao Root diam: 3.30 cm Ao Asc diam:  3.00 cm MITRAL VALVE                TRICUSPID VALVE MV Area (PHT): 3.51 cm     TR Peak grad:   22.7 mmHg MV Area VTI:   3.06 cm     TR Vmax:        238.00 cm/s MV Peak grad:  5.9 mmHg MV Mean grad:  2.0 mmHg     SHUNTS MV Vmax:       1.21 m/s     Systemic VTI:  0.20 m MV Vmean:      65.0 cm/s    Systemic Diam: 2.00 cm MV Decel Time: 216 msec MV E velocity: 67.60 cm/s MV A velocity: 103.00 cm/s MV E/A ratio:  0.66 Zoila Shutter MD Electronically signed by Zoila Shutter MD Signature Date/Time: 04/21/2023/11:05:34 AM    Final    CT ANGIO HEAD NECK W WO CM Result Date: 04/20/2023 CLINICAL DATA:  Stroke/TIA.  Determine embolic source.  EXAM: CT ANGIOGRAPHY HEAD AND NECK WITH AND WITHOUT CONTRAST TECHNIQUE: Multidetector CT imaging of the head and neck was performed using the standard protocol during bolus administration of intravenous contrast. Multiplanar CT image reconstructions and MIPs were obtained to evaluate the vascular anatomy. Carotid stenosis measurements (when applicable) are obtained utilizing NASCET criteria, using the distal internal carotid diameter as the denominator. RADIATION DOSE REDUCTION: This exam was performed according to the departmental dose-optimization program which includes automated exposure control, adjustment of the mA and/or kV according to patient size and/or use of iterative reconstruction technique. CONTRAST:  75mL OMNIPAQUE IOHEXOL 350 MG/ML SOLN COMPARISON:  MRI yesterday. FINDINGS: CT HEAD FINDINGS Brain: Chronic small-vessel ischemic changes affect the pons. Small acute infarction of the middle cerebellar peduncle on the right  cannot be specifically seen by CT. Cerebral hemispheres show chronic small-vessel ischemic changes of the white matter without evidence of an acute stroke. No mass, hemorrhage, hydrocephalus or extra-axial collection. Vascular: There is atherosclerotic calcification of the major vessels at the base of the brain. Skull: Negative Sinuses/Orbits: Clear/normal Other: None Review of the MIP images confirms the above findings CTA NECK FINDINGS Aortic arch: Aortic atherosclerosis.  Branching pattern is normal. Right carotid system: Common carotid artery widely patent to the bifurcation. Minimal atherosclerotic plaque at the bifurcation and ICA bulb but no stenosis. Cervical ICA widely patent. Left carotid system: Common carotid artery widely patent to the bifurcation. Calcified plaque at the bifurcation and proximal ICA bulb. Minimal diameter of the proximal ICA is 1.75 mm. Compared to a more distal cervical ICA diameter of 4.2 mm, this indicates a 60% stenosis. Vertebral arteries: No proximal subclavian stenosis. Right vertebral artery origin is widely patent. The vessel appears normal through the cervical region to the foramen magnum. There is soft and calcified plaque at the left vertebral artery origin with a 50% stenosis. Beyond that, the vessel is patent through the cervical region to the foramen magnum. Skeleton: Minimal spondylosis. Other neck: No mass or lymphadenopathy. Upper chest: Chronic scarring in the anterior right upper lobe. Review of the MIP images confirms the above findings CTA HEAD FINDINGS Anterior circulation: Both internal carotid arteries are patent through the skull base and siphon regions. There is siphon atherosclerotic calcification with maximal stenosis estimated at 30% on both sides. The anterior and middle cerebral vessels are patent. No large vessel occlusion or flow limiting proximal stenosis. No aneurysm or vascular malformation. Posterior circulation: Both vertebral arteries are widely  patent to the basilar artery. No basilar stenosis. Posterior circulation branch vessels are patent. There is moderate long segment stenosis of the proximal right PCA, but the vessel does show persistent flow. Venous sinuses: Patent and normal. Anatomic variants: None significant. Review of the MIP images confirms the above findings IMPRESSION: 1. No large vessel occlusion or flow limiting proximal intracranial stenosis. 2. 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 3. 50% stenosis of the left vertebral artery origin due to soft and calcified plaque. 4. 30% stenosis of both internal carotid artery siphon regions due to calcified plaque. 5. Moderate long segment stenosis of the proximal right PCA, but the vessel does show persistent flow. 6. Aortic atherosclerosis. 7. Chronic small-vessel ischemic changes of the pons and cerebral hemispheric white matter. Small acute infarction of the middle cerebellar peduncle on the right shown by MRI cannot be specifically seen by CT. Aortic Atherosclerosis (ICD10-I70.0). Electronically Signed   By: Paulina Fusi M.D.   On: 04/20/2023 11:22   MR BRAIN W  WO CONTRAST Result Date: 04/19/2023 CLINICAL DATA:  Vertigo EXAM: MRI HEAD WITHOUT AND WITH CONTRAST TECHNIQUE: Multiplanar, multiecho pulse sequences of the brain and surrounding structures were obtained without and with intravenous contrast. CONTRAST:  10mL GADAVIST GADOBUTROL 1 MMOL/ML IV SOLN COMPARISON:  11/08/2022 MRI head, 04/16/2023 CT head FINDINGS: Brain: Small area of restricted diffusion with ADC correlate in the right middle cerebellar peduncle (series 2, image 13 and 14; series 3, image 14). This is associated with a small amount of increased T2 hyperintense signal. No acute hemorrhage, mass, mass effect, or midline shift. No hydrocephalus or extra-axial collection. Pituitary and craniocervical junction within normal limits. Focus of hemosiderin deposition in the pons, likely sequela prior  hypertensive microhemorrhage. Remote lacunar infarcts in the right lentiform nucleus, bilateral thalami, and right pons. Advanced cerebral atrophy for age. Confluent T2 hyperintense signal in the periventricular white matter, likely the sequela of moderate chronic small vessel ischemic disease. Dilated perivascular spaces in the basal ganglia and cortex. Vascular: Normal arterial flow voids. Skull and upper cervical spine: Normal marrow signal. Sinuses/Orbits: Clear paranasal sinuses. No acute finding in the orbits. Status post bilateral lens replacements. Other: Fluid in the right mastoid air cells. IMPRESSION: Small acute infarct in the right middle cerebellar peduncle. Electronically Signed   By: Wiliam Ke M.D.   On: 04/19/2023 19:40   CT Head Wo Contrast Result Date: 04/16/2023 CLINICAL DATA:  Headache, increasing frequency or severity. Dizziness and vomiting. EXAM: CT HEAD WITHOUT CONTRAST TECHNIQUE: Contiguous axial images were obtained from the base of the skull through the vertex without intravenous contrast. RADIATION DOSE REDUCTION: This exam was performed according to the departmental dose-optimization program which includes automated exposure control, adjustment of the mA and/or kV according to patient size and/or use of iterative reconstruction technique. COMPARISON:  Head MRI 11/08/2022 FINDINGS: Brain: There is no evidence of an acute infarct, intracranial hemorrhage, mass, midline shift, or extra-axial fluid collection. Patchy hypodensities in the cerebral white matter are nonspecific but compatible with moderate chronic small vessel ischemic disease. 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 Mild cerebral atrophy is within normal limits for age. Vascular: Calcified atherosclerosis at the skull base. No hyperdense vessel. Skull: No acute fracture or suspicious osseous lesion. Sinuses/Orbits: Paranasal sinuses and mastoid air cells are clear. Bilateral cataract  extraction. Other: None. IMPRESSION: 1. No evidence of acute intracranial abnormality. 2. Moderate chronic small vessel ischemic disease. Electronically Signed   By: Sebastian Ache M.D.   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 TSH recently normal.  Not on AV nodal blocking agents at baseline.  -plan for PPM 1/31 per Dr. Jimmey Ralph  -NPO after MN -plan to hold heparin at 0000 on 1/31 and resume Eliquis pm of 2/1 with close observation of pocket  -avoid AV nodal blocking agents  -continue TVP, VVI 60 - pt currently in NSR 70's  Acute RLL PE  -elevated D-Dimer, CTA positive for RLL occlusive/non-occlusive PE without evidence of R heart strain, does show pulmonary infarct  -heparin as above, will plan to transition back to DOAC after PPM, timing to be determined   Recent Cerebellar CVA -PT efforts   Hx Breast CA  -s/p radical mastectomy on Tamoxifen  -hx of left sided  port-a-cath, removed ~4 years ago / 2021  DM II  -per primary   For questions or updates, please contact [facility] Please consult [email or web address] for contact info under Cardiology/STEMI.  Signed, Canary Brim, NP-C, AGACNP-BC [facility] - Electrophysiology  05/12/2023, 11:06 AM

## 2023-05-12 NOTE — Consult Note (Signed)
Critical Care Team Consult Note   Primary Physician: Sharlene Dory, DO PCP-Cardiologist:  None  Reason for Consultation: [facility]  HPI:    Daruis [name] is a 78 y.o. male with a PMH of RBBB, HTN, recent CVA, HTN, diabetes who is seen today for evaluation of intermittent complete heart block at the request of cardiology.   Patient was seen in the ED on 1/26 after a sycnopal event at home. He was admitted and found to have a small PE, but no evidence of RV strain or biomarker elevation. He was placed on anticoagulation but while at [facility] had an episode of complete heart block with symptoms. Given his symptoms he was transferred to [facility] for temp pacer placement. He currnetly is in the CCU with temp pacer in place. Settings at 60bpm, 5mA, currently in NSR. He is being seen by EP, recently normal echo.       Objective:    Vital Signs:   Temp:  [97.8 F (36.6 C)-98.5 F (36.9 C)] 98.5 F (36.9 C) (01/30 2000) Pulse Rate:  [68-81] 73 (01/30 2100) Resp:  [15-31] 23 (01/30 2100) BP: (99-148)/(59-98) 148/67 (01/30 2100) SpO2:  [88 %-95 %] 88 % (01/30 2100) Weight:  [97.7 kg] 97.7 kg (01/30 0430) Last BM Date : 05/08/23  Weight change: Filed Weights   05/09/23 1114 05/11/23 0500 05/12/23 0430  Weight: 101.9 kg 98 kg 97.7 kg    Intake/Output:   Intake/Output Summary (Last 24 hours) at 05/12/2023 2135 Last data filed at 05/12/2023 2000 Gross per 24 hour  Intake 680.15 ml  Output 1650 ml  Net -969.85 ml      Physical Exam    General:  Well appearing. No resp difficulty HEENT: NCAT Cor:  Regular rate & rhythm. No rubs, gallops or murmurs. JVP flat. No LE edema Lungs: Normal work of breathing, clear to auscultation bilaterally Abdomen: soft, nontender, nondistended Extremities: no cyanosis, clubbing, rash Neuro: alert & orientedx3, cranial nerves grossly intact. moves all 4 extremities w/o difficulty. Affect pleasant Vascular: 2+ radial pulses   Telemetry    Sinus 80s  Labs and medications reviewed in Epic.  Patient Profile   John [name] is a 78 y.o. male with a PMH of RBBB, HTN, recent CVA, HTN, diabetes who is seen today for evaluation of intermittent complete heart block at the request of cardiology.   Assessment/Plan   Complete heart block: Prior history of known conduction disease, currnetly in NSR. EF normal, EP consulted and plan for PPM tomorrow. No clear reversible cause. After discussion, review of CT films/echo believe that his PE is small and not hemodynmically significant. Will hold OAC to lower risk of pocket hematoma, can resume following. - Holding heparin tomorrow AM - EP consulted - PPM in AM - Remove temp pacer at that time  Pulmonary embolism: Small, hemodynamically insignificant. Low risk of holding OAC, induced in the setting of recent hospital stay. - Apixaban following procedure  CVA: No residual deficits.  - Continue atorvastatin, apixaban  Diabetes:  - Continue SSI  Hypertension:  - Hold home BP meds  Length of Stay: 2  Romie Minus, MD  05/12/2023, 9:35 PM  Advanced Heart Failure Team Pager [phone] (M-F; 7a - 5p)  Please contact [facility] for night-coverage after hours (4p -7a ) and weekends on [email or web address]  CRITICAL CARE Performed by: Romie Minus   Total critical care time: 45 minutes  Critical care time was exclusive of separately billable procedures and treating  other patients.  Critical care was necessary to treat or prevent imminent or life-threatening deterioration.  Critical care was time spent personally by me on the following activities: development of treatment plan with patient and/or surrogate as well as nursing, discussions with consultants, evaluation of patient's response to treatment, examination of patient, obtaining history from patient or surrogate, ordering and performing treatments and interventions, ordering and review of laboratory studies, ordering  and review of radiographic studies, pulse oximetry and re-evaluation of patient's condition.

## 2023-05-12 NOTE — Progress Notes (Signed)
PHARMACY - ANTICOAGULATION CONSULT NOTE  Pharmacy Consult for Apixaban -> heparin Indication:  PE/DVT  Allergies  Allergen Reactions   Ramipril Anaphylaxis   Other Diarrhea    Severe intolerance to Chemotherapy in the past.   Sertraline Other (See Comments)    "Extreme headaches"   Adhesive [Tape] Rash    Patient Measurements: Height: 6' (182.9 cm) Weight: 97.7 kg (215 lb 6.2 oz) IBW/kg (Calculated) : 77.6 Heparin dosing wt: 98 kg   Vital Signs: Temp: 98.2 F (36.8 C) (01/30 1100) Temp Source: Oral (01/30 1100) BP: 110/59 (01/30 1332) Pulse Rate: 73 (01/30 1332)  Labs: Recent Labs    05/11/23 0708 05/12/23 0608 05/12/23 1230  HGB 11.9*  --   --   HCT 37.3*  --   --   PLT 380  --   --   APTT  --  130* 47*  HEPARINUNFRC  --  >1.10*  --   CREATININE 0.88  --   --     Estimated Creatinine Clearance: 86.5 mL/min (by C-G formula based on SCr of 0.88 mg/dL).   Medical History: Past Medical History:  Diagnosis Date   BPH (benign prostatic hyperplasia)    CTS (carpal tunnel syndrome)    Depression    Diabetes mellitus    Essential hypertension    GERD (gastroesophageal reflux disease)    History of breast cancer    Hypercholesteremia    Neuropathy      Assessment: 55 y/oF with PMH of recent cerebellar stroke, breast cancer recently discharged from inpatient rehab who presents with a fall and syncopal episode. CTa chest + for PE, no right heart strain. BLE venous duplex + for acute deep vein thrombosis involving the left common femoral vein, and SF junction, age indeterminate deep vein thrombosis involving the external iliac vein. Pt has been on apixaban 10mg  po BID since 1/27 - last dose  at 0815. Pt brought to cath lab for a temporary pacemaker for heart block.   Pharmacy consulted to dose heparin while procedures are needed.  Noted recent cerebellar stroke 04/19/23 but neuro was ok with full AC with apixaban including loading dose so will not aim for lower  goal\  -aPTT= 47 on 1300 units/hr -plans noted for PPM on 1/31 and heparin to stop at midnight  Goal of Therapy: Heparin level 0.3-0.7 units/ml aPTT 66-102 sec Monitor platelets per protocol  Plan:  -Increase heparin to 1600 units/hr -No heparin level since heparin will be off at midnight  Harland German, PharmD Clinical Pharmacist **Pharmacist phone directory can now be found on [email or web address] ([email or web address]).  Listed under [facility].

## 2023-05-12 NOTE — Progress Notes (Signed)
OT Cancellation Note  Patient Details Name: John [name] MRN: [id number] DOB: 07/09/1945   Cancelled Treatment:    Reason Eval/Treat Not Completed: Other (comment);Medical issues which prohibited therapy (Pt started on continuous heparin for PE/DVT on 05/11/23 at 2015. Per Acute Rehab policy, OT to hold for 24 hours after start of heparin. OT to reattempt to see pt for OT eval at a later time as appropriate/available.)  [name] "[name]" M., OTR/L, MA Acute Rehab [phone]  [name] 05/12/2023, 1:18 PM

## 2023-05-12 NOTE — Telephone Encounter (Signed)
Patient Product/process development scientist completed.    The patient is insured through [facility]. Patient has Medicare and is not eligible for a copay card, but may be able to apply for patient assistance or Medicare RX Payment Plan (Patient Must reach out to their plan, if eligible for payment plan), if available.    Ran test claim for Eliquis 5 mg and the current 30 day co-pay is $45.00.   This test claim was processed through [facility]- copay amounts may vary at other pharmacies due to pharmacy/plan contracts, or as the patient moves through the different stages of their insurance plan.     Roland Earl, CPHT Pharmacy Technician III Certified Patient Advocate Operating Room Services Pharmacy Patient Advocate Team Direct Number: [phone]  Fax: [phone]

## 2023-05-12 NOTE — Plan of Care (Signed)
Problem: Education: Goal: Ability to describe self-care measures that may prevent or decrease complications (Diabetes Survival Skills Education) will improve Outcome: Progressing Goal: Individualized Educational Video(s) Outcome: Progressing   Problem: Coping: Goal: Ability to adjust to condition or change in health will improve Outcome: Progressing

## 2023-05-12 NOTE — Progress Notes (Signed)
PHARMACY - ANTICOAGULATION CONSULT NOTE  Pharmacy Consult for heparin Indication:  PE/DVT  Labs: Recent Labs    05/11/23 0708 05/12/23 0608  HGB 11.9*  --   HCT 37.3*  --   PLT 380  --   APTT  --  130*  HEPARINUNFRC  --  >1.10*  CREATININE 0.88  --    Assessment: 78yo male supratherapeutic on heparin with initial dosing while DOAC on hold; no infusion issues or signs of bleeding per RN.  Goal of Therapy:  aPTT 66-102 seconds   Plan:  Decrease heparin infusion by 2 units/kg/hr to 1300 units/hr. Check PTT in 6 hours.   Vernard Gambles, PharmD, BCPS 05/12/2023 6:53 AM

## 2023-05-13 ENCOUNTER — Encounter (HOSPITAL_COMMUNITY)
Admission: EM | Disposition: A | Discharge status: Skilled Nursing Facility | Payer: Self-pay | Source: Home / Self Care | Attending: Family Medicine

## 2023-05-13 ENCOUNTER — Inpatient Hospital Stay: Payer: Medicare Other | Admitting: Family Medicine

## 2023-05-13 ENCOUNTER — Other Ambulatory Visit: Payer: Self-pay

## 2023-05-13 DIAGNOSIS — I442 Atrioventricular block, complete: Secondary | ICD-10-CM | POA: Diagnosis not present

## 2023-05-13 HISTORY — PX: PACEMAKER IMPLANT: EP1218

## 2023-05-13 LAB — CBC
HCT: 34 % — ABNORMAL LOW (ref 39.0–52.0)
Hemoglobin: 11.1 g/dL — ABNORMAL LOW (ref 13.0–17.0)
MCH: 26.9 pg (ref 26.0–34.0)
MCHC: 32.6 g/dL (ref 30.0–36.0)
MCV: 82.5 fL (ref 80.0–100.0)
Platelets: 328 10*3/uL (ref 150–400)
RBC: 4.12 MIL/uL — ABNORMAL LOW (ref 4.22–5.81)
RDW: 15.9 % — ABNORMAL HIGH (ref 11.5–15.5)
WBC: 9.2 10*3/uL (ref 4.0–10.5)
nRBC: 0 % (ref 0.0–0.2)

## 2023-05-13 LAB — MAGNESIUM: Magnesium: 1.5 mg/dL — ABNORMAL LOW (ref 1.7–2.4)

## 2023-05-13 LAB — GLUCOSE, CAPILLARY
Glucose-Capillary: 183 mg/dL — ABNORMAL HIGH (ref 70–99)
Glucose-Capillary: 176 mg/dL — ABNORMAL HIGH (ref 70–99)
Glucose-Capillary: 206 mg/dL — ABNORMAL HIGH (ref 70–99)
Glucose-Capillary: 201 mg/dL — ABNORMAL HIGH (ref 70–99)

## 2023-05-13 LAB — BASIC METABOLIC PANEL
Anion gap: 8 (ref 5–15)
BUN: 10 mg/dL (ref 8–23)
CO2: 24 mmol/L (ref 22–32)
Calcium: 8.9 mg/dL (ref 8.9–10.3)
Chloride: 100 mmol/L (ref 98–111)
Creatinine, Ser: 0.99 mg/dL (ref 0.61–1.24)
GFR, Estimated: >60 mL/min (ref >60)
Glucose, Bld: 176 mg/dL — ABNORMAL HIGH (ref 70–99)
Potassium: 4 mmol/L (ref 3.5–5.1)
Sodium: 132 mmol/L — ABNORMAL LOW (ref 135–145)

## 2023-05-13 LAB — SURGICAL PCR SCREEN
MRSA, PCR: NEGATIVE
Staphylococcus aureus: POSITIVE — AB

## 2023-05-13 SURGERY — PACEMAKER IMPLANT

## 2023-05-13 MED ORDER — SODIUM CHLORIDE 0.9 % IV SOLN: INTRAVENOUS | Status: DC

## 2023-05-13 MED ORDER — FENTANYL CITRATE (PF) 100 MCG/2ML IJ SOLN
INTRAMUSCULAR | Status: DC | PRN
  Administered 2023-05-13 (×2): 25 ug via INTRAVENOUS

## 2023-05-13 MED ORDER — MAGNESIUM SULFATE 4 GM/100ML IV SOLN
4.0000 g | Freq: Once | INTRAVENOUS | Status: AC
  Administered 2023-05-13: 4 g via INTRAVENOUS
  Filled 2023-05-13: qty 100

## 2023-05-13 MED ORDER — CEFAZOLIN SODIUM-DEXTROSE 2-4 GM/100ML-% IV SOLN: 2.0000 g | INTRAVENOUS | Status: DC

## 2023-05-13 MED ORDER — CHLORHEXIDINE GLUCONATE 4 % EX SOLN
60.0000 mL | Freq: Once | CUTANEOUS | Status: AC
  Administered 2023-05-13: 4 via TOPICAL
  Filled 2023-05-13: qty 60

## 2023-05-13 MED ORDER — LIDOCAINE HCL (PF) 1 % IJ SOLN
INTRAMUSCULAR | Status: AC
  Filled 2023-05-13: qty 60

## 2023-05-13 MED ORDER — HEPARIN (PORCINE) IN NACL 1000-0.9 UT/500ML-% IV SOLN
INTRAVENOUS | Status: DC | PRN
  Administered 2023-05-13: 500 mL

## 2023-05-13 MED ORDER — ONDANSETRON HCL 4 MG/2ML IJ SOLN
4.0000 mg | Freq: Three times a day (TID) | INTRAMUSCULAR | Status: DC | PRN
  Administered 2023-05-13: 4 mg via INTRAVENOUS

## 2023-05-13 MED ORDER — LIDOCAINE HCL (PF) 1 % IJ SOLN
INTRAMUSCULAR | Status: DC | PRN
  Administered 2023-05-13: 60 mL

## 2023-05-13 MED ORDER — BENZONATATE 100 MG PO CAPS: 100.0000 mg | ORAL_CAPSULE | Freq: Two times a day (BID) | ORAL | Status: DC | PRN

## 2023-05-13 MED ORDER — MUPIROCIN 2 % EX OINT
1.0000 | TOPICAL_OINTMENT | Freq: Two times a day (BID) | CUTANEOUS | Status: DC
  Administered 2023-05-13 – 2023-05-14 (×3): 1 via NASAL
  Filled 2023-05-13 (×2): qty 22

## 2023-05-13 MED ORDER — FENTANYL CITRATE (PF) 100 MCG/2ML IJ SOLN
INTRAMUSCULAR | Status: AC
  Filled 2023-05-13: qty 2

## 2023-05-13 MED ORDER — IOHEXOL 350 MG/ML SOLN
INTRAVENOUS | Status: DC | PRN
  Administered 2023-05-13: 15 mL

## 2023-05-13 MED ORDER — SODIUM CHLORIDE 0.9 % IV SOLN
INTRAVENOUS | Status: AC
  Filled 2023-05-13: qty 2

## 2023-05-13 MED ORDER — SODIUM CHLORIDE 0.9 % IV SOLN
80.0000 mg | INTRAVENOUS | Status: AC
  Administered 2023-05-13: 80 mg

## 2023-05-13 MED ORDER — ONDANSETRON HCL 4 MG/2ML IJ SOLN
INTRAMUSCULAR | Status: AC
  Filled 2023-05-13: qty 2

## 2023-05-13 MED ORDER — VANCOMYCIN HCL IN DEXTROSE 1-5 GM/200ML-% IV SOLN
INTRAVENOUS | Status: AC
  Administered 2023-05-13: 1000 mg
  Filled 2023-05-13: qty 200

## 2023-05-13 SURGICAL SUPPLY — 13 items
CABLE SURGICAL S-101-97-12 (CABLE) ×1 IMPLANT
CATH CPS LOCATOR 3D MED (CATHETERS) IMPLANT
HELIX LOCKING TOOL (MISCELLANEOUS) ×1
LEAD ULTIPACE 52 LPA1231/52 (Lead) IMPLANT
LEAD ULTIPACE 65 LPA1231/65 (Lead) IMPLANT
PACEMAKER ASSURITY DR-RF (Pacemaker) IMPLANT
PAD DEFIB RADIO PHYSIO CONN (PAD) ×1 IMPLANT
SHEATH 7FR PRELUDE SNAP 13 (SHEATH) IMPLANT
SHEATH 9FR PRELUDE SNAP 13 (SHEATH) IMPLANT
SLITTER AGILIS HISPRO (INSTRUMENTS) IMPLANT
TOOL HELIX LOCKING (MISCELLANEOUS) IMPLANT
TRAY PACEMAKER INSERTION (PACKS) ×1 IMPLANT
WIRE HI TORQ VERSACORE-J 145CM (WIRE) IMPLANT

## 2023-05-13 NOTE — Plan of Care (Signed)
  Problem: Skin Integrity: Goal: Risk for impaired skin integrity will decrease Outcome: Progressing   Problem: Tissue Perfusion: Goal: Adequacy of tissue perfusion will improve Outcome: Progressing   Problem: Education: Goal: Knowledge of General Education information will improve Description: Including pain rating scale, medication(s)/side effects and non-pharmacologic comfort measures Outcome: Progressing   Problem: Clinical Measurements: Goal: Ability to maintain clinical measurements within normal limits will improve Outcome: Progressing

## 2023-05-13 NOTE — Progress Notes (Addendum)
Advanced Heart Failure Rounding Note  Cardiologist: None   Chief Complaint: CHB  Subjective:    Feeling okay. No dypsnea. Reports chronic cough and hoarse voice. + for metapneumovirus 2 weeks ago.   Objective:   Weight Range: 97.7 kg Body mass index is 29.21 kg/m.   Vital Signs:   Temp:  [97.8 F (36.6 C)-98.5 F (36.9 C)] 98.2 F (36.8 C) (01/31 0800) Pulse Rate:  [68-87] 76 (01/31 1100) Resp:  [15-31] 15 (01/31 1100) BP: (99-154)/(59-98) 128/64 (01/31 1100) SpO2:  [88 %-99 %] 95 % (01/31 1100) Last BM Date : 05/08/23  Weight change: Filed Weights   05/09/23 1114 05/11/23 0500 05/12/23 0430  Weight: 101.9 kg 98 kg 97.7 kg    Intake/Output:   Intake/Output Summary (Last 24 hours) at 05/13/2023 1133 Last data filed at 05/13/2023 1000 Gross per 24 hour  Intake 664.01 ml  Output 1150 ml  Net -485.99 ml      Physical Exam    General:  Fatigued, chronically ill appearing HEENT: Normal Neck: Supple. R internal jugular temp pacer. Cor: Regular rate & rhythm. No rubs, gallops or murmurs. Lungs: Clear Abdomen: Soft, nontender, nondistended.  Extremities: No cyanosis, clubbing, rash, edema Neuro: Alert & oriented x 3. Affect pleasant.  Telemetry   SR 80s   Labs    CBC Recent Labs    05/11/23 0708 05/13/23 0215  WBC 9.8 9.2  NEUTROABS 7.4  --   HGB 11.9* 11.1*  HCT 37.3* 34.0*  MCV 84.8 82.5  PLT 380 328   Basic Metabolic Panel Recent Labs    52/84/13 0708 05/13/23 0215  NA 136 132*  K 4.0 4.0  CL 100 100  CO2 23 24  GLUCOSE 180* 176*  BUN 11 10  CREATININE 0.88 0.99  CALCIUM 9.2 8.9  MG 1.6* 1.5*  PHOS 2.7  --    Liver Function Tests Recent Labs    05/11/23 0708  AST 69*  ALT 73*  ALKPHOS 46  BILITOT 1.3*  PROT 6.2*  ALBUMIN 3.2*   No results for input(s): "LIPASE", "AMYLASE" in the last 72 hours. Cardiac Enzymes No results for input(s): "CKTOTAL", "CKMB", "CKMBINDEX", "TROPONINI" in the last 72 hours.  BNP: BNP  (last 3 results) No results for input(s): "BNP" in the last 8760 hours.  ProBNP (last 3 results) No results for input(s): "PROBNP" in the last 8760 hours.   D-Dimer No results for input(s): "DDIMER" in the last 72 hours. Hemoglobin A1C No results for input(s): "HGBA1C" in the last 72 hours. Fasting Lipid Panel No results for input(s): "CHOL", "HDL", "LDLCALC", "TRIG", "CHOLHDL", "LDLDIRECT" in the last 72 hours. Thyroid Function Tests No results for input(s): "TSH", "T4TOTAL", "T3FREE", "THYROIDAB" in the last 72 hours.  Invalid input(s): "FREET3"  Other results:   Imaging    No results found.   Medications:     Scheduled Medications:  atorvastatin  80 mg Oral QHS   buPROPion  75 mg Oral BID   chlorhexidine  60 mL Topical Once   Chlorhexidine Gluconate Cloth  6 each Topical Daily   feeding supplement  237 mL Oral BID BM   finasteride  5 mg Oral Daily   gentamicin (GARAMYCIN) 80 mg in sodium chloride 0.9 % 500 mL irrigation  80 mg Irrigation On Call   insulin aspart  0-5 Units Subcutaneous QHS   insulin aspart  0-9 Units Subcutaneous TID WC   insulin glargine-yfgn  10 Units Subcutaneous Daily   multivitamin with minerals  1 tablet Oral Daily   ondansetron       pantoprazole  40 mg Oral Daily   polyethylene glycol  17 g Oral BID   sodium chloride flush  3 mL Intravenous Q12H   tamsulosin  0.4 mg Oral Daily    Infusions:  sodium chloride 50 mL/hr at 05/13/23 1000   sodium chloride 50 mL/hr at 05/13/23 1000    ceFAZolin (ANCEF) IV      PRN Medications: acetaminophen **OR** acetaminophen, guaiFENesin-dextromethorphan, ondansetron, ondansetron (ZOFRAN) IV    Patient Profile  John [name] is a 78 y.o. male with a PMH of RBBB, HTN, recent CVA, HTN, diabetes who is seen today for evaluation of intermittent complete heart block at the request of cardiology.   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  EF normal. Has temp pacer. EP consulted and plan for PPM today. No clear reversible cause. After discussion, review of CT films/echo believe that his PE is small and not hemodynmically significant. OAC on hold to reduce risk of pocket hematoma, Resume anticoagulation when okay from EP standpoint post-procedure.    Pulmonary embolism: Small, hemodynamically insignificant. Low risk of holding OAC, induced in the setting of recent hospital stay. - Resume apixaban post-procedure as above.   CVA: No residual deficits.  - Continue atorvastatin - Anticoagulation as above   Diabetes:  - Continue SSI   Hypertension:  - Holding home BP meds  Can likely downgrade telemetry unit if stable post pacemaker implant.  Length of Stay: 3  Crisanto Nied N, PA-C  05/13/2023, 11:33 AM  Advanced Heart Failure Team Pager [phone] (M-F; 7a - 5p)  Please contact [facility] for night-coverage after hours (5p -7a ) and weekends on [email or web address]

## 2023-05-13 NOTE — Interval H&P Note (Signed)
History and Physical Interval Note:  05/13/2023 3:01 PM  John [name]  has presented today for surgery, with the diagnosis of intermittent complete heart block.  The various methods of treatment have been discussed with the patient and family. After consideration of risks, benefits and other options for treatment, the patient has consented to  Procedure(s): PACEMAKER IMPLANT (N/A) as a surgical intervention.  The patient's history has been reviewed, patient examined, no change in status, stable for surgery.  I have reviewed the patient's chart and labs.  Questions were answered to the patient's satisfaction.     Nobie Putnam

## 2023-05-13 NOTE — Plan of Care (Signed)
   Problem: Coping: Goal: Ability to adjust to condition or change in health will improve Outcome: Progressing   Problem: Fluid Volume: Goal: Ability to maintain a balanced intake and output will improve Outcome: Progressing   Problem: Health Behavior/Discharge Planning: Goal: Ability to identify and utilize available resources and services will improve Outcome: Progressing Goal: Ability to manage health-related needs will improve Outcome: Progressing   Problem: Metabolic: Goal: Ability to maintain appropriate glucose levels will improve Outcome: Progressing   Problem: Nutritional: Goal: Maintenance of adequate nutrition will improve Outcome: Progressing Goal: Progress toward achieving an optimal weight will improve Outcome: Progressing   Problem: Skin Integrity: Goal: Risk for impaired skin integrity will decrease Outcome: Progressing

## 2023-05-13 NOTE — Progress Notes (Signed)
PT Cancellation Note  Patient Details Name: John [name] MRN: [id number] DOB: 1945/06/21   Cancelled Treatment:    Reason Eval/Treat Not Completed: Patient at procedure or test/unavailable;Medical issues which prohibited therapy (Pt scheduled for pacemaker implant today. PT to follow up with pt for PT after procedure. Will continue to follow up as able and appropriate.)  Harrel Carina, DPT, CLT  Acute Rehabilitation Services Office: [phone] (Secure chat preferred)   Claudia Desanctis 05/13/2023, 1:14 PM

## 2023-05-13 NOTE — Progress Notes (Addendum)
3:30pm; CSW spoke with patient's wife [name] to inform her of information. CSW answered all questions regarding SNF stay at [facility].  TOC will communicate with [name] tomorrow to inform her of final discharge plans.  2:40pm: Patient's insurance authorization has been approved, next review date is 05/17/2023 Berkley Harvey ZO#[id number].  11:30am: CSW received call from patient's wife who states she wants to accept bed offer from [facility].  CSW initiated insurance authorization.  11:05am: CSW spoke with patient's wife [name] to discuss SNF placement post pacemaker placement - [name] agreeable for CSW to email her a list of bed offers via secure email.  CSW sent Medicare list to [email or web address] for review.  Edwin Dada, MSW, LCSW Transitions of Care  Clinical Social Worker II [phone]

## 2023-05-13 NOTE — Progress Notes (Signed)
OT Cancellation Note  Patient Details Name: John [name] MRN: [id number] DOB: [birth year]   Cancelled Treatment:    Reason Eval/Treat Not Completed: Other (comment) (Pt scheduled for pacemaker implant today. OT to follow up with pt for OT eval after procedure as appropriate/available.)  Rosanne Sack "Orson Eva., OTR/L, MA Acute Rehab [phone]  [name] 05/13/2023, 9:21 AM

## 2023-05-13 NOTE — Progress Notes (Signed)
  Patient Name: John [name] Date of Encounter: 05/13/2023  Primary Cardiologist: None Electrophysiologist: None  Interval Summary   The patient is doing well today.  It is his 78th birthday. At this time, the patient denies chest pain, shortness of breath, or any new concerns.  Vital Signs    Vitals:   05/13/23 0900 05/13/23 1000 05/13/23 1100 05/13/23 1200  BP: 136/64 134/70 128/64 (!) 137/57  Pulse: 75 76 76 68  Resp: 20 18 15  (!) 21  Temp:    98.5 F (36.9 C)  TempSrc:    Oral  SpO2: 99% 98% 95% 96%  Weight:      Height:        Intake/Output Summary (Last 24 hours) at 05/13/2023 1253 Last data filed at 05/13/2023 1200 Gross per 24 hour  Intake 850.88 ml  Output 1600 ml  Net -749.12 ml   Filed Weights   05/09/23 1114 05/11/23 0500 05/12/23 0430  Weight: 101.9 kg 98 kg 97.7 kg    Physical Exam    GEN- The patient is well appearing, alert and oriented x 3 today.   Lungs- Clear to ausculation bilaterally, normal work of breathing Cardiac- Regular rate and rhythm, paced via TVP, no murmurs, rubs or gallops GI- soft, NT, ND, + BS Extremities- no clubbing or cyanosis. No edema  Telemetry    SR 60-80's (personally reviewed)  Hospital Course    John [name] is a 78 y.o. male admitted for history of RBBB, HTN, breast cancer s/p radical mastectomy on tamoxifen, vertigo, chronic small vessel infarcts, DM II, and BPH admitted for syncopal episode. He was identified on telemetry to have intermittent CHB while at [facility]. He was transferred to [facility] for permanent pacemaker implantation.   Assessment & Plan    Intermittent Complete Heart Block with Syncopal Episode  Baseline Conduction Disease: RBBB QRS on EKG , LVEF 60-65%. TSH recently normal.  Not on AV nodal blocking agents at baseline.  -NPO for PPM insertion 1/31.25  -heparin stopped MN 1/31 -avoid AV nodal blocking agents  -TVP @ VVI 60 bpm, currently in NSR    Acute RLL PE  Elevated D-Dimer, CTA  positive for RLL occlusive/non-occlusive PE without evidence of R heart strain, does show pulmonary infarct  -plan to resume DOAC post implant, timing to be determined    Recent Cerebellar CVA -PT efforts    Hx Breast CA  -s/p radical mastectomy on Tamoxifen  -hx of left sided port-a-cath, removed ~4 years ago / 2021   DM II  -per primary        For questions or updates, please contact [facility] Please consult [email or web address] for contact info under Cardiology/STEMI.  Signed, Canary Brim, NP-C, AGACNP-BC [facility] - Electrophysiology  05/13/2023, 12:57 PM

## 2023-05-13 NOTE — Plan of Care (Signed)
  Problem: Safety: Goal: Ability to remain free from injury will improve Outcome: Progressing   Problem: Skin Integrity: Goal: Risk for impaired skin integrity will decrease Outcome: Progressing   Problem: Education: Goal: Knowledge of cardiac device and self-care will improve Outcome: Progressing   Problem: Cardiac: Goal: Ability to achieve and maintain adequate cardiopulmonary perfusion will improve Outcome: Progressing

## 2023-05-13 NOTE — H&P (View-Only) (Signed)
  Patient Name: John [name] Date of Encounter: 05/13/2023  Primary Cardiologist: None Electrophysiologist: None  Interval Summary   The patient is doing well today.  It is his 78th birthday. At this time, the patient denies chest pain, shortness of breath, or any new concerns.  Vital Signs    Vitals:   05/13/23 0900 05/13/23 1000 05/13/23 1100 05/13/23 1200  BP: 136/64 134/70 128/64 (!) 137/57  Pulse: 75 76 76 68  Resp: 20 18 15  (!) 21  Temp:    98.5 F (36.9 C)  TempSrc:    Oral  SpO2: 99% 98% 95% 96%  Weight:      Height:        Intake/Output Summary (Last 24 hours) at 05/13/2023 1253 Last data filed at 05/13/2023 1200 Gross per 24 hour  Intake 850.88 ml  Output 1600 ml  Net -749.12 ml   Filed Weights   05/09/23 1114 05/11/23 0500 05/12/23 0430  Weight: 101.9 kg 98 kg 97.7 kg    Physical Exam    GEN- The patient is well appearing, alert and oriented x 3 today.   Lungs- Clear to ausculation bilaterally, normal work of breathing Cardiac- Regular rate and rhythm, paced via TVP, no murmurs, rubs or gallops GI- soft, NT, ND, + BS Extremities- no clubbing or cyanosis. No edema  Telemetry    SR 60-80's (personally reviewed)  Hospital Course    Kazuto Sevey is a 78 y.o. male admitted for history of RBBB, HTN, breast cancer s/p radical mastectomy on tamoxifen, vertigo, chronic small vessel infarcts, DM II, and BPH admitted for syncopal episode. He was identified on telemetry to have intermittent CHB while at [facility]. He was transferred to [facility] for permanent pacemaker implantation.   Assessment & Plan    Intermittent Complete Heart Block with Syncopal Episode  Baseline Conduction Disease: RBBB QRS on EKG , LVEF 60-65%. TSH recently normal.  Not on AV nodal blocking agents at baseline.  -NPO for PPM insertion 1/31.25  -heparin stopped MN 1/31 -avoid AV nodal blocking agents  -TVP @ VVI 60 bpm, currently in NSR    Acute RLL PE  Elevated D-Dimer, CTA  positive for RLL occlusive/non-occlusive PE without evidence of R heart strain, does show pulmonary infarct  -plan to resume DOAC post implant, timing to be determined    Recent Cerebellar CVA -PT efforts    Hx Breast CA  -s/p radical mastectomy on Tamoxifen  -hx of left sided port-a-cath, removed ~4 years ago / 2021   DM II  -per primary        For questions or updates, please contact [facility] Please consult [email or web address] for contact info under Cardiology/STEMI.  Signed, Canary Brim, NP-C, AGACNP-BC [facility] - Electrophysiology  05/13/2023, 12:57 PM

## 2023-05-14 ENCOUNTER — Inpatient Hospital Stay (HOSPITAL_COMMUNITY): Payer: Medicare Other

## 2023-05-14 DIAGNOSIS — F325 Major depressive disorder, single episode, in full remission: Secondary | ICD-10-CM | POA: Diagnosis not present

## 2023-05-14 DIAGNOSIS — N189 Chronic kidney disease, unspecified: Secondary | ICD-10-CM | POA: Diagnosis not present

## 2023-05-14 DIAGNOSIS — I10 Essential (primary) hypertension: Secondary | ICD-10-CM | POA: Diagnosis not present

## 2023-05-14 DIAGNOSIS — G4700 Insomnia, unspecified: Secondary | ICD-10-CM | POA: Diagnosis not present

## 2023-05-14 DIAGNOSIS — E43 Unspecified severe protein-calorie malnutrition: Secondary | ICD-10-CM | POA: Diagnosis not present

## 2023-05-14 DIAGNOSIS — I442 Atrioventricular block, complete: Secondary | ICD-10-CM | POA: Diagnosis not present

## 2023-05-14 DIAGNOSIS — D649 Anemia, unspecified: Secondary | ICD-10-CM | POA: Diagnosis not present

## 2023-05-14 DIAGNOSIS — Z950 Presence of cardiac pacemaker: Secondary | ICD-10-CM | POA: Diagnosis not present

## 2023-05-14 DIAGNOSIS — R262 Difficulty in walking, not elsewhere classified: Secondary | ICD-10-CM | POA: Diagnosis not present

## 2023-05-14 DIAGNOSIS — I959 Hypotension, unspecified: Secondary | ICD-10-CM | POA: Diagnosis not present

## 2023-05-14 DIAGNOSIS — R41841 Cognitive communication deficit: Secondary | ICD-10-CM | POA: Diagnosis not present

## 2023-05-14 DIAGNOSIS — R0989 Other specified symptoms and signs involving the circulatory and respiratory systems: Secondary | ICD-10-CM | POA: Diagnosis not present

## 2023-05-14 DIAGNOSIS — G629 Polyneuropathy, unspecified: Secondary | ICD-10-CM | POA: Diagnosis not present

## 2023-05-14 DIAGNOSIS — R058 Other specified cough: Secondary | ICD-10-CM | POA: Diagnosis not present

## 2023-05-14 DIAGNOSIS — Z452 Encounter for adjustment and management of vascular access device: Secondary | ICD-10-CM | POA: Diagnosis not present

## 2023-05-14 DIAGNOSIS — Z1152 Encounter for screening for COVID-19: Secondary | ICD-10-CM | POA: Diagnosis not present

## 2023-05-14 DIAGNOSIS — F54 Psychological and behavioral factors associated with disorders or diseases classified elsewhere: Secondary | ICD-10-CM | POA: Diagnosis not present

## 2023-05-14 DIAGNOSIS — R531 Weakness: Secondary | ICD-10-CM | POA: Diagnosis not present

## 2023-05-14 DIAGNOSIS — E785 Hyperlipidemia, unspecified: Secondary | ICD-10-CM | POA: Diagnosis not present

## 2023-05-14 DIAGNOSIS — M6281 Muscle weakness (generalized): Secondary | ICD-10-CM | POA: Diagnosis not present

## 2023-05-14 DIAGNOSIS — R2681 Unsteadiness on feet: Secondary | ICD-10-CM | POA: Diagnosis not present

## 2023-05-14 DIAGNOSIS — K219 Gastro-esophageal reflux disease without esophagitis: Secondary | ICD-10-CM | POA: Diagnosis not present

## 2023-05-14 DIAGNOSIS — Z743 Need for continuous supervision: Secondary | ICD-10-CM | POA: Diagnosis not present

## 2023-05-14 DIAGNOSIS — I2699 Other pulmonary embolism without acute cor pulmonale: Secondary | ICD-10-CM | POA: Diagnosis not present

## 2023-05-14 DIAGNOSIS — E119 Type 2 diabetes mellitus without complications: Secondary | ICD-10-CM | POA: Diagnosis not present

## 2023-05-14 DIAGNOSIS — R55 Syncope and collapse: Secondary | ICD-10-CM | POA: Diagnosis not present

## 2023-05-14 LAB — BASIC METABOLIC PANEL
Anion gap: 9 (ref 5–15)
BUN: 8 mg/dL (ref 8–23)
CO2: 26 mmol/L (ref 22–32)
Calcium: 9.1 mg/dL (ref 8.9–10.3)
Chloride: 100 mmol/L (ref 98–111)
Creatinine, Ser: 0.89 mg/dL (ref 0.61–1.24)
GFR, Estimated: >60 mL/min (ref >60)
Glucose, Bld: 177 mg/dL — ABNORMAL HIGH (ref 70–99)
Potassium: 4 mmol/L (ref 3.5–5.1)
Sodium: 135 mmol/L (ref 135–145)

## 2023-05-14 LAB — CBC
HCT: 34.2 % — ABNORMAL LOW (ref 39.0–52.0)
Hemoglobin: 11.3 g/dL — ABNORMAL LOW (ref 13.0–17.0)
MCH: 27.4 pg (ref 26.0–34.0)
MCHC: 33 g/dL (ref 30.0–36.0)
MCV: 82.8 fL (ref 80.0–100.0)
Platelets: 282 10*3/uL (ref 150–400)
RBC: 4.13 MIL/uL — ABNORMAL LOW (ref 4.22–5.81)
RDW: 15.7 % — ABNORMAL HIGH (ref 11.5–15.5)
WBC: 7.3 10*3/uL (ref 4.0–10.5)
nRBC: 0 % (ref 0.0–0.2)

## 2023-05-14 LAB — GLUCOSE, CAPILLARY
Glucose-Capillary: 222 mg/dL — ABNORMAL HIGH (ref 70–99)
Glucose-Capillary: 248 mg/dL — ABNORMAL HIGH (ref 70–99)
Glucose-Capillary: 231 mg/dL — ABNORMAL HIGH (ref 70–99)

## 2023-05-14 LAB — MAGNESIUM: Magnesium: 2.4 mg/dL (ref 1.7–2.4)

## 2023-05-14 MED ORDER — APIXABAN 5 MG PO TABS: 5.0000 mg | ORAL_TABLET | Freq: Two times a day (BID) | ORAL | Status: DC

## 2023-05-14 MED ORDER — ACETAMINOPHEN ER 650 MG PO TBCR: 650.0000 mg | EXTENDED_RELEASE_TABLET | Freq: Three times a day (TID) | ORAL | Status: DC | PRN

## 2023-05-14 NOTE — TOC Progression Note (Addendum)
Transition of Care [facility]) - Progression Note    Patient Details  Name: John [name] MRN: [id number] Date of Birth: 1946/01/30  Transition of Care [facility]) CM/SW Contact  Nicanor Bake Phone Number: [phone] 05/14/2023, 9:53 AM  Clinical Narrative:   8:54 AM- CSW called to confirm bed availability for pt. CSW left a VM asking to be called back.   9:06 AM- CSW received a call back from [place], who is covering for [place]. Bed availability confirmed. Waiting for pt to be medically ready for dc.   12:07 PM- CSW called and spoke with pts wife about transportation dc plan. Pt stated that they are ok with using [facility]. Pts asked for clarification on dc date. CSW explained that she was waiting to hear back from MD about dc.   1:02 PM- CSW called and updated wife about dc today and [facility]. CSW spoke with Sharia Reeve at [facility] who confirmed pts room #404A and the number to call for report is [phone].   TOC will continue following.    Expected Discharge Plan: Skilled Nursing Facility Barriers to Discharge: Continued Medical Work up, SNF Pending bed offer  Expected Discharge Plan and Services In-house Referral: Clinical Social Work Discharge Planning Services: NA Post Acute Care Choice: Skilled Nursing Facility Living arrangements for the past 2 months: Single Family Home                 DME Arranged: N/A DME Agency: NA                   Social Determinants of Health (SDOH) Interventions SDOH Screenings   Food Insecurity: No Food Insecurity (05/10/2023)  Housing: Low Risk  (05/10/2023)  Transportation Needs: No Transportation Needs (05/10/2023)  Utilities: Not At Risk (05/10/2023)  Alcohol Screen: Low Risk  (01/27/2023)  Depression (PHQ2-9): Low Risk  (01/28/2023)  Financial Resource Strain: Low Risk  (04/07/2023)  Physical Activity: Unknown (04/07/2023)  Social Connections: Moderately Isolated (05/10/2023)  Stress: No Stress Concern Present (04/07/2023)  Tobacco Use:  Low Risk  (05/08/2023)    Readmission Risk Interventions    05/11/2023   12:52 PM 04/22/2023   12:12 PM  Readmission Risk Prevention Plan  Medication Screening  Complete  Transportation Screening Complete Complete  PCP or Specialist Appt within 3-5 Days Complete   HRI or Home Care Consult Complete   Social Work Consult for Recovery Care Planning/Counseling Complete   Palliative Care Screening Not Applicable   Medication Review Oceanographer) Complete

## 2023-05-14 NOTE — Progress Notes (Signed)
Physical Therapy Treatment Patient Details Name: John [name] MRN: [id number] DOB: Oct 10, 1945 Today's Date: 05/14/2023   History of Present Illness 78 yo male admitted 05/08/2023 to [facility] s/p syncope with RLE DVT and PE. Transfer to [facility] 1/29. 1/31 PPM placement. PMHx: Pt D/C from [facility] 1/26 following CVA. HTN, GERD, anemia, BPH, GERD, falls, vertigo, CVA , breast CA s/p mastectomy, neuropathy    PT Comments  Pt pleasant, HOH and has difficulty maintaining and adhering to precautions. On arrival pt with arm pushed through sling and reaching for items on left. Pt educated for precautions throughout session with min-mod cues for all transfers and sling donned in chair end of session. Pt sleeps in recliner and utilized foot egress to rise to sitting with pt able to initiate gait. Pt maintains decreased strength, gait and function in need of assist and ptatient will benefit from continued inpatient follow up therapy, <3 hours/day     If plan is discharge home, recommend the following: A lot of help with walking and/or transfers;Assist for transportation;Assistance with cooking/housework;Help with stairs or ramp for entrance;A lot of help with bathing/dressing/bathroom   Can travel by private vehicle     Yes  Equipment Recommendations  None recommended by PT    Recommendations for Other Services       Precautions / Restrictions Precautions Precautions: Fall;ICD/Pacemaker     Mobility  Bed Mobility Overal bed mobility: Needs Assistance Bed Mobility: Supine to Sit           General bed mobility comments: supine to sit via foot egress due to pt sleeping in recliner for a year at home, min assist to scoot to edge of surface    Transfers Overall transfer level: Needs assistance   Transfers: Sit to/from Stand Sit to Stand: Min assist, +2 safety/equipment           General transfer comment: min assist to rise from foot egress and recliner with cues for LUE placement so he would  not utilize during transfers    Ambulation/Gait Ambulation/Gait assistance: Min assist, +2 safety/equipment Gait Distance (Feet): 30 Feet Assistive device: Rolling walker (2 wheels) Gait Pattern/deviations: Step-through pattern, Decreased stride length, Leaning posteriorly   Gait velocity interpretation: <1.8 ft/sec, indicate of risk for recurrent falls   General Gait Details: pt with initial posterior right lean with mod cues to correct with guarding and assist for balance with gait. pt walked 24' then 30' with close chair follow limited by fatigue. Mod cues for proximity to RW to maintain PPM precautions   Stairs             Wheelchair Mobility     Tilt Bed    Modified Rankin (Stroke Patients Only)       Balance Overall balance assessment: Needs assistance Sitting-balance support: Feet supported, No upper extremity supported Sitting balance-Leahy Scale: Fair     Standing balance support: Bilateral upper extremity supported, During functional activity, Reliant on assistive device for balance Standing balance-Leahy Scale: Poor Standing balance comment: min assist and Rw for standing balance                            Cognition Arousal: Alert Behavior During Therapy: Flat affect Overall Cognitive Status: Within Functional Limits for tasks assessed                                 General  Comments: Very HOH. Left ear hears better per patient.        Exercises      General Comments        Pertinent Vitals/Pain Pain Assessment Pain Assessment: No/denies pain    Home Living                          Prior Function            PT Goals (current goals can now be found in the care plan section) Progress towards PT goals: Progressing toward goals    Frequency    Min 1X/week      PT Plan      Co-evaluation              AM-PAC PT "6 Clicks" Mobility   Outcome Measure  Help needed turning from your back  to your side while in a flat bed without using bedrails?: A Lot Help needed moving from lying on your back to sitting on the side of a flat bed without using bedrails?: A Lot Help needed moving to and from a bed to a chair (including a wheelchair)?: A Lot Help needed standing up from a chair using your arms (e.g., wheelchair or bedside chair)?: A Lot Help needed to walk in hospital room?: A Lot Help needed climbing 3-5 steps with a railing? : Total 6 Click Score: 11    End of Session Equipment Utilized During Treatment: Gait belt Activity Tolerance: Patient tolerated treatment well Patient left: in chair;with call bell/phone within reach (chair alarm pad under pt but no box present with RN aware) Nurse Communication: Mobility status PT Visit Diagnosis: Other abnormalities of gait and mobility (R26.89);Difficulty in walking, not elsewhere classified (R26.2);Unsteadiness on feet (R26.81);History of falling (Z91.81)     Time: 1040-1103 PT Time Calculation (min) (ACUTE ONLY): 23 min  Charges:    $Gait Training: 8-22 mins $Therapeutic Activity: 8-22 mins PT General Charges $$ ACUTE PT VISIT: 1 Visit                     Merryl Hacker, PT Acute Rehabilitation Services Office: [phone]    Enedina Finner Yazleen Molock 05/14/2023, 12:11 PM

## 2023-05-14 NOTE — Discharge Summary (Signed)
Discharge Summary    Patient ID: John [name] MRN: [id number]; DOB: 05/30/1945  Admit date: 05/08/2023 Discharge date: 05/14/2023  PCP:  Sharlene Dory, DO   [facility] Providers Cardiologist:     Discharge Diagnoses    Principal Problem:   Fall at home, initial encounter Active Problems:   GERD (gastroesophageal reflux disease)   BPH (benign prostatic hyperplasia)   Dizziness and giddiness   Insulin dependent type 2 diabetes mellitus (HCC)   Hypotension   Abnormal LFTs   Anemia   Syncope   Protein-calorie malnutrition, severe   Heart block AV complete [facility])    Diagnostic Studies/Procedures    PPM implant 05/13/23:  Technical Details SURGEON:  Nobie Putnam, MD      PREPROCEDURE DIAGNOSES:   1. Complete heart block     POSTPROCEDURE DIAGNOSES:  1. Complete heart block     PROCEDURES:    1. Dual chamber permanent pacemaker implant      INTRODUCTION:  John [name] is a 78 y.o. patient with baseline conduction disease and intermittent complete heart block who presents to the EP lab for PPM implant.     DESCRIPTION OF PROCEDURE:  Informed written consent was obtained and the patient was brought to the electrophysiology lab in the fasting state. Prophylactic antibiotics were given. The patient was adequately sedated with intravenous Versed, and fentanyl as outlined in the nursing report.  The patient's left chest was prepped and draped in the usual sterile fashion by the EP lab staff.  The skin overlying the left deltopectoral region was infiltrated with lidocaine for local analgesia.  An incision was created over the left deltopectoral region.  A left prepectoral pocket was fashioned using a combination of sharp and blunt dissection.  Electrocautery was used to assure hemostasis.   Lead Placement: The left axillary vein was cannulated using ultrasound guidance.  Through the left axillary vein a Wholey wire was passed to the RV. Over the wire,  a septal sheath was passed to the mid septum. An RV lead (model UltiPace [id number], serial [id number]) was passed through the sheath and fixed to the RV septum.  It was advanced until there was evidence of capture of the left sided conduction system. There it displayed excelling pacing (1.0 V at 0.24ms) and sensing thresholds (>12.0 mV) with an acceptable impedance (630 ohms). The lead was secured to the pectoral fascia. Next, the right atrial lead (model UltiPace [id number], serial [id number]) was advanced to the RA appendage where it displayed excellent pacing (1.0 V at 0.68ms) and sensing (3.2 mV) thresholds with an acceptable impedance (480 ohms). It was secured to the pectoral fascia. The pocket was irrigated with copious vancomycin solution.  The leads were then  connected to a pulse generator (model Assurity MRI [id number], serial [id number]). The pocket was closed in layers of absorbable suture. EBL < 10mL. Steri-strips and a sterile dressing were applied.   During this procedure the patient is administered a total of Fentanyl 50 mcg to achieve and maintain moderate conscious sedation.  The patient's heart rate, blood pressure, and oxygen saturation are monitored continuously during the procedure. I was present face-to-face 100% of the time sedation was administered.     Estimated blood loss <50 mL.   During this procedure medications were administered to achieve and maintain moderate conscious sedation while the patient's heart rate, blood pressure, and oxygen saturation were continuously monitored and I was present face-to-face 100% of this time. Kalman Jewels RN and Juanetta Beets  Cardiovascular Specialist are independent, trained observers who assisted in the monitoring of the patient's level of consciousness.    Echo from 05/10/2023:  1. Left ventricular ejection fraction, by estimation, is 60 to 65%. The  left ventricle has normal function. The left ventricle has no regional  wall motion abnormalities. Left  ventricular diastolic function could not  be evaluated.   2. Right ventricular systolic function is normal. The right ventricular  size is normal. There is mildly elevated pulmonary artery systolic  pressure. The estimated right ventricular systolic pressure is 38.0 mmHg.   3. The mitral valve is normal in structure. No evidence of mitral valve  regurgitation. No evidence of mitral stenosis.   4. The aortic valve is tricuspid. There is mild calcification of the  aortic valve. Aortic valve regurgitation is not visualized. No aortic  stenosis is present.   5. The inferior vena cava is dilated in size with >50% respiratory  variability, suggesting right atrial pressure of 8 mmHg.   Lower extremity ultrasound from 05/09/2023:  BILATERAL:  -No evidence of popliteal cyst, bilaterally.  RIGHT:  - There is no evidence of deep vein thrombosis in the lower extremity.    LEFT:  - Findings consistent with acute deep vein thrombosis involving the left  common femoral vein, and SF junction.    - Findings consistent with age indeterminate deep vein thrombosis  involving the external iliac vein.   CT head without contrast in 05/08/2023:  1. No acute intracranial abnormality or significant interval change. 2. Stable atrophy and white matter disease likely reflects the sequela of chronic microvascular ischemia. 3. Remote lacunar infarcts of the thalami bilaterally and inferior left caudate head. 4. Minimal bilateral maxillary and ethmoid sinus disease.     MRI of brain without 05/08/2023:  1. Punctate acute infarct in the right middle cerebellar peduncle. 2. 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  CT cervical spine without on 05/09/2023:  1. No acute fracture or traumatic subluxation. 2. Mild degenerative changes in the lower cervical spine without focal stenosis.   CTA of chest on 05/09/2023:  1. There is small volume occlusive and nonocclusive pulmonary embolism in  the right lung lower lobe pulmonary artery branches, as described in detail above. No right heart strain. 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 2. There is small right pleural effusion and additional areas of subsegmental atelectasis in the right upper lobe and left lung lower lobe. 3. Multiple other nonacute observations, as described above.   Aortic Atherosclerosis (ICD10-I70.0).  History of Present Illness      Per Admission H&P 05/08/2023 by Dr. Allena Katz:  John [name] is a 78 y.o. 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  Admission requested for fall, weakness, hypotension.  Per nurses report systolic of 93 noted. [place] RUQ 2021: No GB  stones. Ct chest 2021: Extensive Cad .     >>ED Course: In emergency room alert awake oriented afebrile O2 sats within normal limits on room air.  Initial blood pressures were low with systolic noted to be 93 by nurse note.   ED evaluation showed: Troponin, dimer requested.  EKG ordered.  Respiratory panel Metabolic panel shows AKI with a creatinine of 1.32 EGFR 56 AST 49 ALT 47 anion gap of 16. CBC within normal limits.  He was admitted under hospitalist service initially for fall, dizziness, hypotension.    Hospital Course      Consultants: Cardiology critical care team as well as EP  78 year old male with past medical history including ischemic stroke, vertigo, RBBB, type 2 diabetes, hypertension, BPH, hepatic steatosis, GERD, breast cancer status post radical mastectomy on tamoxifen, who presented from home for a fall at home on 05/08/2023 and syncope episode.  He was recently released by inpatient rehab on 05/07/2023 after hospitalization for R cerebellar stroke.    He was initially admitted under hospitalist service at [facility].  CTA of chest on 05/09/2023 revealed small volume of occlusive and nonocclusive PE in the right lung lower lobe pulmonary artery branches, no right heart strain, resultant segmental right lung lower lobe lung infarction; small right pleural effusion and additional area of subsegmental atelectasis in the right upper lobe and left lung lower lobe.  Lower extremity Doppler in 05/09/2023 revealing acute DVT involving the left common femoral vein and SF junction, age indeterminate deep VTE involving the external iliac vein.  He was initiated on Eliquis for anticoagulation with 10 mg twice daily loading.   MRI of brain was done on 05/07/2022 revealing punctate acute infarct in the right middle cerebellar peduncle, remote lacunar infarcts in the bilateral thalami and the pons.  MRI of brain was interpreted by neurology team felt right middle cerebellar stroke is not acute but evaluation of recent known infarct.  Given initiation of anticoagulation with Eliquis for PE, antiplatelet therapy was stopped.  Neurology had recommended no antiplatelet while taking Eliquis, resume Plavix monotherapy when Eliquis is stopped.   He was found orthostatic hypotensive which was felt to be the etiology of his fall and syncope.  His antihypertensives were held initially.   Urgent cardiology consult was requested on 05/11/2023 due to abrupt onset severe bradycardia 30 bpm, associated with hypotension.  Patient was seen by  Dr. Royann Shivers, felt telemetry at the time revealed wide-complex rhythm at 29 bpm consistent with idioventricular rhythm.  EKG quality was poor but appeared to be sinus rhythm with complete heart block and idioventricular escape rhythm.  He was not on any rate slowing agent.  He had required IV dopamine support with improvement of heart rate to 80s but had continued symptoms while having 3-second pause.  Echocardiogram from 1/28 showed LVEF 60 to 65%, no regional motion abnormality, normal RV, mildly elevated PASP 38 mmHg, no significant valvular disease. He was felt a possible candidate for permanent pacemaker and therefore transferred to [facility].   He had arrived to [facility] at [facility] on 05/11/2023 afternoon, successful temporary pacemaker was placed by Dr. [place].  He was seen by EP Dr. Jimmey Ralph 05/12/2023, was felt a candidate for permanent pacemaker due to intermittent complete heart block.  His Eliquis was held and started on heparin drip for anticoagulation for PE/DVT.  Meanwhile, he was also seen by Dr. Elwyn Lade from cardiac critical care.  05/13/23 he underwent successful dual-chamber pacemaker implant with left bundle branch area  pacing lead by Dr. Jimmey Ralph.  He had no postprocedure complication.  Please refer to procedure report for detail.  He was followed up by Dr. Elwyn Lade today, sheath for temp wire had been removed, downgraded to floor status from ICU.  He was evaluated by PT and recommended SNF.  Social worker has secured a bed for him at [facility].  He was seen by EP Dr Jimmey Ralph today, chest x-ray today revealed appropriate lead position and no pneumothorax. Device check revealed appropriate function and stable lead parameters:  RA - threshold 0.5V @0 .5ms, sensing 3.64mV, impedance 450 ohms RV - threshold 0.5V @0 .5ms, sensing >12.62mV, impedance 460 ohms  He was felt stable for discharge from EP perspective.  He was advised to resume Eliquis tonight at 5 mg BID (not complete 10 mg loading for PE  due to pacemaker insertion per pharmacy).  Postprocedure instruction had been reviewed with the patient by MD. Written instruction will be provided  Lab work today revealed mild hyperglycemia 177, otherwise unremarkable BMP.  CBC revealed hemoglobin 11.3, otherwise unremarkable.  He has remained hemodynamically stable based on vital sign records.  He is on room air.  Wound check has been arranged with EP on 05/26/2023.   Upon review with pharmacy today, reports that patient was instructed to discontinue tamoxifen for breast cancer due to blood clot per Dr. Trisha Mangle. (Oncologist) in [facility].  She will need a follow-up with oncology in 25-month. This was reviewed with the patient's wife/   Giving low normal blood pressure, will resume amlodipine and stop PTA Lasix and potassium.  Otherwise there is no change of his chronic medication for BPH, hyperlipidemia, depression, type 2 diabetes, GERD.       Discharge diagnosis:  Syncope Complete heart block Fall PE/DVT History of recent cerebellar stroke Type 2 diabetes Breast cancer Hypertension BPH GERD Debility      Did the patient have an acute coronary syndrome (MI, NSTEMI, STEMI, etc) this admission?:  No                               Did the patient have a percutaneous coronary intervention (stent / angioplasty)?:  No.        The patient will be scheduled for a TOC follow up appointment in 12 days.  A message has been sent to the [facility] and Scheduling Pool at the office where the patient should be seen for follow up.  _____________   Discharge Vitals Blood pressure 132/74, pulse 79, temperature 98.1 F (36.7 C), temperature source Oral, resp. rate (!) 23, height 6' (1.829 m), weight 97.7 kg, SpO2 96%.  Filed Weights   05/09/23 1114 05/11/23 0500 05/12/23 0430  Weight: 101.9 kg 98 kg 97.7 kg    Please see rounding notes from Dr. Elwyn Lade and Dr. Jimmey Ralph today for physical exam  Labs & Radiologic Studies     CBC Recent Labs    05/13/23 0215 05/14/23 0244  WBC 9.2 7.3  HGB 11.1* 11.3*  HCT 34.0* 34.2*  MCV 82.5 82.8  PLT 328 282   Basic Metabolic Panel Recent Labs    91/47/82 0215 05/14/23 0244  NA 132* 135  K 4.0 4.0  CL 100 100  CO2 24 26  GLUCOSE 176* 177*  BUN 10 8  CREATININE 0.99 0.89  CALCIUM 8.9 9.1  MG 1.5* 2.4   Liver Function Tests No results for input(s): "AST", "ALT", "ALKPHOS", "BILITOT", "PROT", "ALBUMIN" in  the last 72 hours. No results for input(s): "LIPASE", "AMYLASE" in the last 72 hours. High Sensitivity Troponin:   Recent Labs  Lab 05/08/23 1835  TROPONINIHS 9    BNP Invalid input(s): "POCBNP" D-Dimer No results for input(s): "DDIMER" in the last 72 hours. Hemoglobin A1C No results for input(s): "HGBA1C" in the last 72 hours. Fasting Lipid Panel No results for input(s): "CHOL", "HDL", "LDLCALC", "TRIG", "CHOLHDL", "LDLDIRECT" in the last 72 hours. Thyroid Function Tests No results for input(s): "TSH", "T4TOTAL", "T3FREE", "THYROIDAB" in the last 72 hours.  Invalid input(s): "FREET3" _____________  DG Chest 2 View Result Date: 05/14/2023 CLINICAL DATA:  78 year old male status post pacemaker placement. EXAM: CHEST - 2 VIEW COMPARISON:  Chest x-ray 05/08/2023. FINDINGS: Right internal jugular central venous catheter with tip terminating in the proximal superior vena cava. New left-sided pacemaker device in place with lead tips projecting over the expected location of the right atrium and right ventricle. Lung volumes are low. No consolidative airspace disease. No pleural effusions. No pneumothorax. No pulmonary nodule or mass noted. Pulmonary vasculature and the cardiomediastinal silhouette are within normal limits. IMPRESSION: 1. Support apparatus, as above. 2. Low lung volumes without radiographic evidence of acute cardiopulmonary disease. Electronically Signed   By: Trudie Reed M.D.   On: 05/14/2023 08:36   EP PPM/ICD IMPLANT Result Date:  05/13/2023  CONCLUSIONS:  1. Successful dual chamber pacemaker implant with left bundle branch area pacing lead.  2. No early apparent complications. Nobie Putnam, MD Cardiac Electrophysiology   CARDIAC CATHETERIZATION Result Date: 05/11/2023 Successful placement of temporary transvenous pacemaker.   ECHOCARDIOGRAM LIMITED Result Date: 05/10/2023    ECHOCARDIOGRAM LIMITED REPORT   Patient Name:   John [name] Date of Exam: 05/10/2023 Medical Rec #:  [id number]        Height:       72.0 in Accession #:    [id number]       Weight:       224.6 lb Date of Birth:  [birth year]        BSA:          2.238 m Patient Age:    77 years         BP:           146/75 mmHg Patient Gender: M                HR:           79 bpm. Exam Location:  Inpatient Procedure: Limited Echo, Cardiac Doppler and Limited Color Doppler Indications:    Pulmonary Embolism  History:        Patient has prior history of Echocardiogram examinations, most                 recent 04/21/2023. Stroke, Signs/Symptoms:Hypotension; Risk                 Factors:Diabetes and Breast Cancer.  Sonographer:    Amy Chionchio Referring Phys: [id number] A CALDWELL POWELL JR IMPRESSIONS  1. Left ventricular ejection fraction, by estimation, is 60 to 65%. The left ventricle has normal function. The left ventricle has no regional wall motion abnormalities. Left ventricular diastolic function could not be evaluated.  2. Right ventricular systolic function is normal. The right ventricular size is normal. There is mildly elevated pulmonary artery systolic pressure. The estimated right ventricular systolic pressure is 38.0 mmHg.  3. The mitral valve is normal in structure. No evidence of mitral valve regurgitation. No evidence of mitral stenosis.  4. The aortic valve is tricuspid. There is mild calcification of the aortic valve. Aortic valve regurgitation is not visualized. No aortic stenosis is present.  5. The inferior vena cava is dilated in size with >50% respiratory  variability, suggesting right atrial pressure of 8 mmHg. Conclusion(s)/Recommendation(s): Technically very limited study due to poor sound wave transmission. LV/RV look grossly normal. FINDINGS  Left Ventricle: Left ventricular ejection fraction, by estimation, is 60 to 65%. The left ventricle has normal function. The left ventricle has no regional wall motion abnormalities. The left ventricular internal cavity size was normal in size. There is  no left ventricular hypertrophy. Left ventricular diastolic function could not be evaluated. Right Ventricle: The right ventricular size is normal. No increase in right ventricular wall thickness. Right ventricular systolic function is normal. There is mildly elevated pulmonary artery systolic pressure. The tricuspid regurgitant velocity is 2.74  m/s, and with an assumed right atrial pressure of 8 mmHg, the estimated right ventricular systolic pressure is 38.0 mmHg. Left Atrium: Left atrial size was normal in size. Right Atrium: Right atrial size was normal in size. Pericardium: There is no evidence of pericardial effusion. Mitral Valve: The mitral valve is normal in structure. No evidence of mitral valve stenosis. Tricuspid Valve: The tricuspid valve is normal in structure. Tricuspid valve regurgitation is mild . No evidence of tricuspid stenosis. Aortic Valve: The aortic valve is tricuspid. There is mild calcification of the aortic valve. Aortic valve regurgitation is not visualized. No aortic stenosis is present. Pulmonic Valve: The pulmonic valve was normal in structure. Pulmonic valve regurgitation is trivial. No evidence of pulmonic stenosis. Aorta: The aortic root is normal in size and structure. Venous: The inferior vena cava is dilated in size with greater than 50% respiratory variability, suggesting right atrial pressure of 8 mmHg. IAS/Shunts: No atrial level shunt detected by color flow Doppler. RIGHT VENTRICLE          IVC RV Basal diam:  4.10 cm  IVC diam: 2.00  cm TAPSE (M-mode): 1.9 cm RIGHT ATRIUM           Index RA Area:     16.40 cm RA Volume:   44.30 ml  19.79 ml/m  PULMONIC VALVE PV Vmax:          0.88 m/s PV Peak grad:     3.1 mmHg PR End Diast Vel: 7.18 msec  TRICUSPID VALVE TR Peak grad:   30.0 mmHg TR Vmax:        274.00 cm/s Arvilla Meres MD Electronically signed by Arvilla Meres MD Signature Date/Time: 05/10/2023/12:01:37 PM    Final    VAS [place] LOWER EXTREMITY VENOUS (DVT) Result Date: 05/09/2023  Lower Venous DVT Study Patient Name:  John [name]  Date of Exam:   05/09/2023 Medical Rec #: [id number]         Accession #:    [id number] Date of Birth: 22-Oct-1945         Patient Gender: M Patient Age:   28 years Exam [facility]  [facility] Procedure:      VAS [place] LOWER EXTREMITY VENOUS (DVT) Referring Phys: A POWELL JR --------------------------------------------------------------------------------  Indications: Edema.  Risk Factors: Hx of breast cancer on tamoxifen. Comparison Study: No previous exams Performing Technologist: Jody Hill RVT, RDMS  Examination Guidelines: A complete evaluation includes B-mode imaging, spectral Doppler, color Doppler, and power Doppler as needed of all accessible portions of each vessel. Bilateral testing is considered an integral part of a complete examination. Limited  examinations for reoccurring indications may be performed as noted. The reflux portion of the exam is performed with the patient in reverse Trendelenburg.  +---------+---------------+---------+-----------+----------+--------------+ RIGHT    CompressibilityPhasicitySpontaneityPropertiesThrombus Aging +---------+---------------+---------+-----------+----------+--------------+ CFV      Full           Yes      Yes                                 +---------+---------------+---------+-----------+----------+--------------+ SFJ      Full                                                         +---------+---------------+---------+-----------+----------+--------------+ FV Prox  Full           Yes      Yes                                 +---------+---------------+---------+-----------+----------+--------------+ FV Mid   Full           Yes      Yes                                 +---------+---------------+---------+-----------+----------+--------------+ FV DistalFull           Yes      Yes                                 +---------+---------------+---------+-----------+----------+--------------+ PFV      Full                                                        +---------+---------------+---------+-----------+----------+--------------+ POP      Full           Yes      Yes                                 +---------+---------------+---------+-----------+----------+--------------+ PTV      Full                                                        +---------+---------------+---------+-----------+----------+--------------+ PERO     Full                                                        +---------+---------------+---------+-----------+----------+--------------+   +---------+----------------+---------+-----------+----------+-----------------+ LEFT     Compressibility PhasicitySpontaneityPropertiesThrombus Aging    +---------+----------------+---------+-----------+----------+-----------------+ CFV      Partial         Yes      Yes  Acute             +---------+----------------+---------+-----------+----------+-----------------+ SFJ      Partial                                       Acute             +---------+----------------+---------+-----------+----------+-----------------+ FV Prox  Full            Yes      Yes                                    +---------+----------------+---------+-----------+----------+-----------------+ FV Mid   Full            No       Yes                                     +---------+----------------+---------+-----------+----------+-----------------+ FV DistalFull            Yes      Yes                                    +---------+----------------+---------+-----------+----------+-----------------+ PFV      Full                                                            +---------+----------------+---------+-----------+----------+-----------------+ POP      Full            Yes      Yes                                    +---------+----------------+---------+-----------+----------+-----------------+ PTV      Full                                                            +---------+----------------+---------+-----------+----------+-----------------+ PERO     Full                                                            +---------+----------------+---------+-----------+----------+-----------------+ EIV      Partial (distal)Yes      Yes                  Age Indeterminate +---------+----------------+---------+-----------+----------+-----------------+ Unable to visualized CIV due to habitus / bowel gas    Summary: BILATERAL: -No evidence of popliteal cyst, bilaterally. RIGHT: - There is no evidence of deep vein thrombosis in the lower extremity.  LEFT: - Findings consistent with acute deep vein thrombosis involving the left common femoral vein, and SF junction.  - Findings  consistent with age indeterminate deep vein thrombosis involving the external iliac vein.   *See table(s) above for measurements and observations. Electronically signed by Gerarda Fraction on 05/09/2023 at 5:03:50 PM.    Final    CT Angio Chest Pulmonary Embolism (PE) W or WO Contrast Addendum Date: 05/09/2023 ADDENDUM REPORT: 05/09/2023 09:49 ADDENDUM: Critical Value/emergent results were called by telephone at the time of interpretation on 05/09/2023 at 9:49 am to Dr. Rose Phi , who verbally acknowledged these results. Electronically Signed   By: Jules Schick M.D.   On:  05/09/2023 09:49   Result Date: 05/09/2023 CLINICAL DATA:  Pulmonary embolism (PE) suspected, low to intermediate prob, positive D-dimer. Weakness. Fall. EXAM: CT ANGIOGRAPHY CHEST WITH CONTRAST TECHNIQUE: Multidetector CT imaging of the chest was performed using the standard protocol during bolus administration of intravenous contrast. Multiplanar CT image reconstructions and MIPs were obtained to evaluate the vascular anatomy. RADIATION DOSE REDUCTION: This exam was performed according to the departmental dose-optimization program which includes automated exposure control, adjustment of the mA and/or kV according to patient size and/or use of iterative reconstruction technique. CONTRAST:  75mL OMNIPAQUE IOHEXOL 350 MG/ML SOLN COMPARISON:  CT scan chest from 08/29/2020. FINDINGS: Cardiovascular: There is small volume pulmonary embolism. There is nonocclusive pulmonary embolism in the right lower lobe lobar artery and occlusive embolism in the right lower lobe segmental pulmonary arteries. No right heart strain. There are segmental heterogeneous opacities in the right lung lower lobe which enhances less than other lung parenchyma and is compatible with lung infarction. Normal cardiac size. No pericardial effusion. No aortic aneurysm. There are coronary artery calcifications, in keeping with coronary artery disease. There are also mild peripheral atherosclerotic vascular calcifications of thoracic aorta and its major branches. Mediastinum/Nodes: Visualized thyroid gland appears grossly unremarkable. No solid / cystic mediastinal masses. The esophagus is nondistended precluding optimal assessment. No axillary, mediastinal or hilar lymphadenopathy by size criteria. Lungs/Pleura: The central tracheo-bronchial tree is patent. There is new small right pleural effusion with associated compressive atelectatic changes in the right lower lobe. There are additional subsegmental atelectatic changes in the right upper lobe and  left lung lower lobe. No lung mass or pneumothorax. No suspicious lung nodules. No left pleural effusion. Redemonstration of peripheral/subpleural opacity with associated linear areas of scarring/atelectasis in the right lung upper lobe, anteriorly, nonspecific but commonly seen in patients with prior radiation. Upper Abdomen: Small volume cholelithiasis without acute cholecystitis. There are scattered calcified granulomas in the spleen. Visualized upper abdominal viscera within normal limits. Musculoskeletal: The visualized soft tissues of the chest wall are grossly unremarkable. No suspicious osseous lesions. There are mild multilevel degenerative changes in the visualized spine. Review of the MIP images confirms the above findings. IMPRESSION: 1. There is small volume occlusive and nonocclusive pulmonary embolism in the right lung lower lobe pulmonary artery branches, as described in detail above. No right heart strain. 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 2. There is small right pleural effusion and additional areas of subsegmental atelectasis in the right upper lobe and left lung lower lobe. 3. Multiple other nonacute observations, as described above. Aortic Atherosclerosis (ICD10-I70.0). Electronically Signed: By: Jules Schick M.D. On: 05/09/2023 09:39   CT CERVICAL SPINE WO CONTRAST Result Date: 05/09/2023 CLINICAL DATA:  Neck trauma.  Mechanical fall EXAM: CT CERVICAL SPINE WITHOUT CONTRAST TECHNIQUE: Multidetector CT imaging of the cervical spine was performed without intravenous contrast. Multiplanar CT image reconstructions were also generated. RADIATION DOSE REDUCTION: This exam was  performed according to the departmental dose-optimization program which includes automated exposure control, adjustment of the mA and/or kV according to patient size and/or use of iterative reconstruction technique. COMPARISON:  MRI the cervical spine 10/23/2015. FINDINGS: Alignment: No  significant listhesis is present. Straightening of the normal cervical lordosis is again seen. Skull base and vertebrae: The craniocervical junction is normal. Vertebral body heights are normal. No acute fractures are present. Soft tissues and spinal canal: No prevertebral fluid or swelling. No visible canal hematoma. Disc levels: Mild degenerative changes are present in the lower cervical spine. No focal stenosis is present. Upper chest: The lung apices are clear. The thoracic inlet is within normal limits. IMPRESSION: 1. No acute fracture or traumatic subluxation. 2. Mild degenerative changes in the lower cervical spine without focal stenosis. Electronically Signed   By: Marin Roberts M.D.   On: 05/09/2023 09:43   MR BRAIN WO CONTRAST Result Date: 05/09/2023 CLINICAL DATA:  Syncope/presyncope, cerebrovascular cause suspected EXAM: MRI HEAD WITHOUT CONTRAST TECHNIQUE: Multiplanar, multiecho pulse sequences of the brain and surrounding structures were obtained without intravenous contrast. COMPARISON:  CT head 05/08/2023. FINDINGS: Brain: Punctate acute infarct in the right middle cerebellar peduncle. Moderate patchy T2/FLAIR hyperintensities in the white matter nonspecific but compatible with chronic microvascular ischemic disease. Cerebral atrophy. No evidence of acute hemorrhage, mass lesion or midline shift. Remote infarcts in bilateral thalami in the pons. Vascular: Major arterial flow voids are maintained Skull and upper cervical spine: Normal marrow signal. Sinuses/Orbits: Paranasal sinus mucosal thickening. No acute orbital findings. Other: No mastoid effusions. IMPRESSION: 1. Punctate acute infarct in the right middle cerebellar peduncle. 2. 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 Electronically Signed   By: Feliberto Harts M.D.   On: 05/09/2023 00:31   CT Head Wo Contrast Result Date: 05/08/2023 CLINICAL DATA:  Head trauma, minor.  Weakness.  Fall. EXAM: CT HEAD WITHOUT  CONTRAST TECHNIQUE: Contiguous axial images were obtained from the base of the skull through the vertex without intravenous contrast. RADIATION DOSE REDUCTION: This exam was performed according to the departmental dose-optimization program which includes automated exposure control, adjustment of the mA and/or kV according to patient size and/or use of iterative reconstruction technique. COMPARISON:  CT head without contrast 04/20/2023 FINDINGS: Brain: Moderate atrophy and white matter disease is stable. Remote lacunar infarcts are again noted within the thalami bilaterally. A remote lacunar infarct is present in the inferior left caudate head. No acute cortical infarct or significant interval change is present. The ventricles are proportionate to the degree of atrophy. No significant extraaxial fluid collection is present. No acute hemorrhage or mass lesion is present. Midline structures are within normal limits. The brainstem and cerebellum are within normal limits. Vascular: Atherosclerotic calcifications are present within the cavernous internal carotid arteries bilaterally. No hyperdense vessel is present. Skull: Calvarium is intact. No focal lytic or blastic lesions are present. No significant extracranial soft tissue lesion is present. Sinuses/Orbits: Minimal fluid is present in the maxillary sinuses bilaterally. Scattered ethmoid opacification is present. The paranasal sinuses and mastoid air cells are otherwise clear. Bilateral lens replacements are noted. Globes and orbits are otherwise unremarkable. IMPRESSION: 1. No acute intracranial abnormality or significant interval change. 2. Stable atrophy and white matter disease likely reflects the sequela of chronic microvascular ischemia. 3. Remote lacunar infarcts of the thalami bilaterally and inferior left caudate head. 4. Minimal bilateral maxillary and ethmoid sinus disease. Electronically Signed   By: Marin Roberts M.D.   On: 05/08/2023 15:35  DG  Chest 2 View Result Date: 05/08/2023 CLINICAL DATA:  Dizziness and fall EXAM: CHEST - 2 VIEW COMPARISON:  Chest radiograph dated 04/28/2023 FINDINGS: Patient is rotated to the right. Low lung volumes with bronchovascular crowding. Right mid lung linear and patchy opacity. Left lower lung patchy opacity. Small right pleural effusion. No pneumothorax. The heart size and mediastinal contours are within normal limits. No radiographic finding of acute displaced fracture. IMPRESSION: 1. Low lung volumes with bronchovascular crowding. Right mid lung linear and patchy opacity and left lower lung patchy opacity may reflect atelectasis, aspiration, or pneumonia. 2. Small right pleural effusion. 3.  No radiographic finding of acute displaced fracture. Electronically Signed   By: Agustin Cree M.D.   On: 05/08/2023 14:19   DG Chest 2 View Result Date: 04/28/2023 CLINICAL DATA:  Cough. EXAM: CHEST - 2 VIEW COMPARISON:  12/10/2019. FINDINGS: The heart size and mediastinal contours are within normal limits. No consolidation, effusion, or pneumothorax. Degenerative changes are present in the thoracic spine. No acute osseous abnormality is seen. IMPRESSION: No active cardiopulmonary disease. Electronically Signed   By: Thornell Sartorius M.D.   On: 04/28/2023 20:14   ECHOCARDIOGRAM COMPLETE Result Date: 04/21/2023    ECHOCARDIOGRAM REPORT   Patient Name:   John [name] Date of Exam: 04/21/2023 Medical Rec #:  [id number]        Height:       73.0 in Accession #:    [id number]       Weight:       230.8 lb Date of Birth:  1946/02/09        BSA:          2.287 m Patient Age:    77 years         BP:           138/61 mmHg Patient Gender: M                HR:           89 bpm. Exam Location:  Inpatient Procedure: 2D Echo, Cardiac Doppler, Color Doppler and Intracardiac            Opacification Agent Indications:    Stroke  History:        Patient has prior history of Echocardiogram examinations, most                 recent 11/14/2020.  Signs/Symptoms:Edema and Fatigue; Risk                 Factors:Hypertension, Diabetes and Dyslipidemia.  Sonographer:    Vern Claude Referring Phys: [id number] SRISHTI L BHAGAT  Sonographer Comments: Image acquisition challenging due to patient body habitus. IMPRESSIONS  1. Left ventricular ejection fraction, by estimation, is 60 to 65%. Left ventricular ejection fraction by 2D MOD biplane is 61.9 %. The left ventricle has normal function. The left ventricle has no regional wall motion abnormalities. Left ventricular diastolic parameters are consistent with Grade I diastolic dysfunction (impaired relaxation).  2. Right ventricular systolic function is normal. The right ventricular size is normal. There is normal pulmonary artery systolic pressure. The estimated right ventricular systolic pressure is 25.7 mmHg.  3. The mitral valve is grossly normal. No evidence of mitral valve regurgitation.  4. The aortic valve is tricuspid. Aortic valve regurgitation is not visualized. Aortic valve sclerosis/calcification is present, without any evidence of aortic stenosis. Comparison(s): No significant change from prior study. 11/14/2020: LVEF 60-65%. FINDINGS  Left Ventricle: Left ventricular ejection  fraction, by estimation, is 60 to 65%. Left ventricular ejection fraction by 2D MOD biplane is 61.9 %. The left ventricle has normal function. The left ventricle has no regional wall motion abnormalities. The left ventricular internal cavity size was normal in size. There is no left ventricular hypertrophy. Left ventricular diastolic parameters are consistent with Grade I diastolic dysfunction (impaired relaxation). Indeterminate filling pressures. Right Ventricle: The right ventricular size is normal. No increase in right ventricular wall thickness. Right ventricular systolic function is normal. There is normal pulmonary artery systolic pressure. The tricuspid regurgitant velocity is 2.38 m/s, and  with an assumed right atrial  pressure of 3 mmHg, the estimated right ventricular systolic pressure is 25.7 mmHg. Left Atrium: Left atrial size was normal in size. Right Atrium: Right atrial size was normal in size. Pericardium: There is no evidence of pericardial effusion. Mitral Valve: The mitral valve is grossly normal. No evidence of mitral valve regurgitation. MV peak gradient, 5.9 mmHg. The mean mitral valve gradient is 2.0 mmHg. Tricuspid Valve: The tricuspid valve is grossly normal. Tricuspid valve regurgitation is trivial. Aortic Valve: The aortic valve is tricuspid. Aortic valve regurgitation is not visualized. Aortic valve sclerosis/calcification is present, without any evidence of aortic stenosis. Aortic valve mean gradient measures 3.0 mmHg. Aortic valve peak gradient measures 4.4 mmHg. Aortic valve area, by VTI measures 2.96 cm. Pulmonic Valve: The pulmonic valve was grossly normal. Pulmonic valve regurgitation is trivial. Aorta: The aortic root and ascending aorta are structurally normal, with no evidence of dilitation. IAS/Shunts: No atrial level shunt detected by color flow Doppler.  LEFT VENTRICLE PLAX 2D                        Biplane EF (MOD) LVIDd:         4.50 cm         LV Biplane EF:   Left LVIDs:         2.90 cm                          ventricular LV PW:         0.70 cm                          ejection LV IVS:        0.90 cm                          fraction by LVOT diam:     2.00 cm                          2D MOD LV SV:         63                               biplane is LV SV Index:   27                               61.9 %. LVOT Area:     3.14 cm                                Diastology  LV e' medial:    5.59 cm/s LV Volumes (MOD)               LV E/e' medial:  12.1 LV vol d, MOD    88.1 ml       LV e' lateral:   8.70 cm/s A2C:                           LV E/e' lateral: 7.8 LV vol d, MOD    88.4 ml A4C: LV vol s, MOD    30.1 ml A2C: LV vol s, MOD    36.7 ml A4C: LV SV MOD A2C:    58.0 ml LV SV MOD A4C:   88.4 ml LV SV MOD BP:    55.5 ml RIGHT VENTRICLE RV Basal diam:  3.70 cm RV Mid diam:    2.20 cm RV S prime:     13.40 cm/s TAPSE (M-mode): 3.2 cm LEFT ATRIUM             Index        RIGHT ATRIUM           Index LA diam:        3.60 cm 1.57 cm/m   RA Area:     12.30 cm LA Vol (A2C):   36.9 ml 16.14 ml/m  RA Volume:   25.20 ml  11.02 ml/m LA Vol (A4C):   42.5 ml 18.59 ml/m LA Biplane Vol: 43.7 ml 19.11 ml/m  AORTIC VALVE                    PULMONIC VALVE AV Area (Vmax):    2.85 cm     PV Vmax:          0.95 m/s AV Area (Vmean):   2.59 cm     PV Peak grad:     3.6 mmHg AV Area (VTI):     2.96 cm     PR End Diast Vel: 1.08 msec AV Vmax:           105.00 cm/s AV Vmean:          77.400 cm/s AV VTI:            0.212 m AV Peak Grad:      4.4 mmHg AV Mean Grad:      3.0 mmHg LVOT Vmax:         95.20 cm/s LVOT Vmean:        63.700 cm/s LVOT VTI:          0.200 m LVOT/AV VTI ratio: 0.94  AORTA Ao Root diam: 3.30 cm Ao Asc diam:  3.00 cm MITRAL VALVE                TRICUSPID VALVE MV Area (PHT): 3.51 cm     TR Peak grad:   22.7 mmHg MV Area VTI:   3.06 cm     TR Vmax:        238.00 cm/s MV Peak grad:  5.9 mmHg MV Mean grad:  2.0 mmHg     SHUNTS MV Vmax:       1.21 m/s     Systemic VTI:  0.20 m MV Vmean:      65.0 cm/s    Systemic Diam: 2.00 cm MV Decel Time: 216 msec MV E velocity: 67.60 cm/s MV A velocity: 103.00 cm/s MV E/A ratio:  0.66 Zoila Shutter MD Electronically signed by Iantha Fallen  Hilty MD Signature Date/Time: 04/21/2023/11:05:34 AM    Final    CT ANGIO HEAD NECK W WO CM Result Date: 04/20/2023 CLINICAL DATA:  Stroke/TIA.  Determine embolic source. EXAM: CT ANGIOGRAPHY HEAD AND NECK WITH AND WITHOUT CONTRAST TECHNIQUE: Multidetector CT imaging of the head and neck was performed using the standard protocol during bolus administration of intravenous contrast. Multiplanar CT image reconstructions and MIPs were obtained to evaluate the vascular anatomy. Carotid stenosis measurements  (when applicable) are obtained utilizing NASCET criteria, using the distal internal carotid diameter as the denominator. RADIATION DOSE REDUCTION: This exam was performed according to the departmental dose-optimization program which includes automated exposure control, adjustment of the mA and/or kV according to patient size and/or use of iterative reconstruction technique. CONTRAST:  75mL OMNIPAQUE IOHEXOL 350 MG/ML SOLN COMPARISON:  MRI yesterday. FINDINGS: CT HEAD FINDINGS Brain: Chronic small-vessel ischemic changes affect the pons. Small acute infarction of the middle cerebellar peduncle on the right cannot be specifically seen by CT. Cerebral hemispheres show chronic small-vessel ischemic changes of the white matter without evidence of an acute stroke. No mass, hemorrhage, hydrocephalus or extra-axial collection. Vascular: There is atherosclerotic calcification of the major vessels at the base of the brain. Skull: Negative Sinuses/Orbits: Clear/normal Other: None Review of the MIP images confirms the above findings CTA NECK FINDINGS Aortic arch: Aortic atherosclerosis.  Branching pattern is normal. Right carotid system: Common carotid artery widely patent to the bifurcation. Minimal atherosclerotic plaque at the bifurcation and ICA bulb but no stenosis. Cervical ICA widely patent. Left carotid system: Common carotid artery widely patent to the bifurcation. Calcified plaque at the bifurcation and proximal ICA bulb. Minimal diameter of the proximal ICA is 1.75 mm. Compared to a more distal cervical ICA diameter of 4.2 mm, this indicates a 60% stenosis. Vertebral arteries: No proximal subclavian stenosis. Right vertebral artery origin is widely patent. The vessel appears normal through the cervical region to the foramen magnum. There is soft and calcified plaque at the left vertebral artery origin with a 50% stenosis. Beyond that, the vessel is patent through the cervical region to the foramen magnum. Skeleton:  Minimal spondylosis. Other neck: No mass or lymphadenopathy. Upper chest: Chronic scarring in the anterior right upper lobe. Review of the MIP images confirms the above findings CTA HEAD FINDINGS Anterior circulation: Both internal carotid arteries are patent through the skull base and siphon regions. There is siphon atherosclerotic calcification with maximal stenosis estimated at 30% on both sides. The anterior and middle cerebral vessels are patent. No large vessel occlusion or flow limiting proximal stenosis. No aneurysm or vascular malformation. Posterior circulation: Both vertebral arteries are widely patent to the basilar artery. No basilar stenosis. Posterior circulation branch vessels are patent. There is moderate long segment stenosis of the proximal right PCA, but the vessel does show persistent flow. Venous sinuses: Patent and normal. Anatomic variants: None significant. Review of the MIP images confirms the above findings IMPRESSION: 1. No large vessel occlusion or flow limiting proximal intracranial stenosis. 2. 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 3. 50% stenosis of the left vertebral artery origin due to soft and calcified plaque. 4. 30% stenosis of both internal carotid artery siphon regions due to calcified plaque. 5. Moderate long segment stenosis of the proximal right PCA, but the vessel does show persistent flow. 6. Aortic atherosclerosis. 7. Chronic small-vessel ischemic changes of the pons and cerebral hemispheric white matter. Small acute infarction of the middle cerebellar peduncle on the right  shown by MRI cannot be specifically seen by CT. Aortic Atherosclerosis (ICD10-I70.0). Electronically Signed   By: Paulina Fusi M.D.   On: 04/20/2023 11:22   MR BRAIN W WO CONTRAST Result Date: 04/19/2023 CLINICAL DATA:  Vertigo EXAM: MRI HEAD WITHOUT AND WITH CONTRAST TECHNIQUE: Multiplanar, multiecho pulse sequences of the brain and surrounding structures were obtained  without and with intravenous contrast. CONTRAST:  10mL GADAVIST GADOBUTROL 1 MMOL/ML IV SOLN COMPARISON:  11/08/2022 MRI head, 04/16/2023 CT head FINDINGS: Brain: Small area of restricted diffusion with ADC correlate in the right middle cerebellar peduncle (series 2, image 13 and 14; series 3, image 14). This is associated with a small amount of increased T2 hyperintense signal. No acute hemorrhage, mass, mass effect, or midline shift. No hydrocephalus or extra-axial collection. Pituitary and craniocervical junction within normal limits. Focus of hemosiderin deposition in the pons, likely sequela prior hypertensive microhemorrhage. Remote lacunar infarcts in the right lentiform nucleus, bilateral thalami, and right pons. Advanced cerebral atrophy for age. Confluent T2 hyperintense signal in the periventricular white matter, likely the sequela of moderate chronic small vessel ischemic disease. Dilated perivascular spaces in the basal ganglia and cortex. Vascular: Normal arterial flow voids. Skull and upper cervical spine: Normal marrow signal. Sinuses/Orbits: Clear paranasal sinuses. No acute finding in the orbits. Status post bilateral lens replacements. Other: Fluid in the right mastoid air cells. IMPRESSION: Small acute infarct in the right middle cerebellar peduncle. Electronically Signed   By: Wiliam Ke M.D.   On: 04/19/2023 19:40   CT Head Wo Contrast Result Date: 04/16/2023 CLINICAL DATA:  Headache, increasing frequency or severity. Dizziness and vomiting. EXAM: CT HEAD WITHOUT CONTRAST TECHNIQUE: Contiguous axial images were obtained from the base of the skull through the vertex without intravenous contrast. RADIATION DOSE REDUCTION: This exam was performed according to the departmental dose-optimization program which includes automated exposure control, adjustment of the mA and/or kV according to patient size and/or use of iterative reconstruction technique. COMPARISON:  Head MRI 11/08/2022 FINDINGS:  Brain: There is no evidence of an acute infarct, intracranial hemorrhage, mass, midline shift, or extra-axial fluid collection. Patchy hypodensities in the cerebral white matter are nonspecific but compatible with moderate chronic small vessel ischemic disease. 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 Mild cerebral atrophy is within normal limits for age. Vascular: Calcified atherosclerosis at the skull base. No hyperdense vessel. Skull: No acute fracture or suspicious osseous lesion. Sinuses/Orbits: Paranasal sinuses and mastoid air cells are clear. Bilateral cataract extraction. Other: None. IMPRESSION: 1. No evidence of acute intracranial abnormality. 2. Moderate chronic small vessel ischemic disease. Electronically Signed   By: Sebastian Ache M.D.   On: 04/16/2023 16:45   Disposition   Seen by Dr. Elwyn Lade and Dr. Jimmey Ralph today, deemed stable for discharge to home .  Postprocedure instruction had been reviewed with the patient by MD .  Follow-up appointment with EP had been arranged on 05/26/2023 .  Discharge medication change, postop plan had reviewed with patient's wife over the phone.  She is agreeable.  Pt is being discharged to SNF today in good condition.   Follow-up Plans & Appointments     Contact information for follow-up providers     [facility] Follow up on 05/26/2023.   Specialty: Cardiology Why: at 8:40am for your pacemaker implant follow up appointment Contact information: [address] [place] [address] [phone]  Contact information for after-discharge care     Destination     [facility] .   Service: Skilled Nursing Contact information: [address] Test Update Address [place] [address] [phone]                    Discharge Instructions     Diet - low sodium heart healthy   Complete by: As directed     Discharge wound care:   Complete by: As directed    Keep pacemaker steri-strips intact until wound check, keep the area dry and clean, monitor for increased redness, swelling, tenderness, drainage, fever, chills. See post PPM   Increase activity slowly   Complete by: As directed         Discharge Medications   Allergies as of 05/14/2023       Reactions   Ramipril Anaphylaxis   Other Diarrhea   Severe intolerance to Chemotherapy in the past.   Sertraline Other (See Comments)   "Extreme headaches"   Adhesive [tape] Rash        Medication List     STOP taking these medications    aspirin EC 81 MG tablet   clopidogrel 75 MG tablet Commonly known as: PLAVIX   furosemide 80 MG tablet Commonly known as: LASIX   potassium chloride SA 20 MEQ tablet Commonly known as: KLOR-CON M   tamoxifen 20 MG tablet Commonly known as: NOLVADEX       TAKE these medications    Accu-Chek Guide w/Device Kit Use daily to check blood sugar.  DX E11.69   Accu-Chek Softclix Lancet Dev Kit Use daily to check blood sugar.  DX E11.69   acetaminophen 650 MG CR tablet Commonly known as: TYLENOL Take 1 tablet (650 mg total) by mouth every 8 (eight) hours as needed for pain. What changed: how much to take   amLODipine 5 MG tablet Commonly known as: NORVASC Take 1 tablet (5 mg total) by mouth daily.   apixaban 5 MG Tabs tablet Commonly known as: ELIQUIS Take 1 tablet (5 mg total) by mouth 2 (two) times daily.   atorvastatin 80 MG tablet Commonly known as: LIPITOR Take 1 tablet (80 mg total) by mouth at bedtime.   buPROPion 75 MG tablet Commonly known as: WELLBUTRIN Take 1 tablet (75 mg total) by mouth 2 (two) times daily.   finasteride 5 MG tablet Commonly known as: PROSCAR Take 1 tablet (5 mg total) by mouth daily.   guaiFENesin 100 MG/5ML liquid Commonly known as: ROBITUSSIN Take 10 mLs by mouth every 6 (six) hours as needed for cough or to loosen phlegm.   HumaLOG  KwikPen 200 UNIT/ML KwikPen Generic drug: insulin lispro Inject 25 Units into the skin daily at 6 (six) AM.   meclizine 12.5 MG tablet Commonly known as: ANTIVERT Take 1 tablet (12.5 mg total) by mouth 3 (three) times daily as needed for dizziness.   metFORMIN 500 MG 24 hr tablet Commonly known as: GLUCOPHAGE-XR Take 2 tablets (1,000 mg total) by mouth 2 (two) times daily with a meal.   multivitamin tablet Take 1 tablet by mouth 2 (two) times a week.   pantoprazole 40 MG tablet Commonly known as: PROTONIX Take 1 tablet (40 mg total) by mouth daily. (Take in place of omeprazole while taking clopidogrel.)   polyethylene glycol 17 g packet Commonly known as: MIRALAX / GLYCOLAX Take 17 g by mouth 2 (two) times daily.   tamsulosin 0.4 MG Caps capsule Commonly  known as: FLOMAX Take 1 capsule (0.4 mg total) by mouth daily.   Toujeo Max SoloStar 300 UNIT/ML Solostar Pen Generic drug: insulin glargine (2 Unit Dial) Inject 45 units before breakfast and supper.   triamcinolone 0.1 % cream : eucerin Crea Apply 1 Application topically 2 (two) times daily.   vitamin C 1000 MG tablet Take 1,000 mg by mouth 3 (three) times a week.               Discharge Care Instructions  (From admission, onward)           Start     Ordered   05/14/23 0000  Discharge wound care:       Comments: Keep pacemaker steri-strips intact until wound check, keep the area dry and clean, monitor for increased redness, swelling, tenderness, drainage, fever, chills. See post [facility]   05/14/23 1354               Outstanding Labs/Studies    Duration of Discharge Encounter: APP Time: 40 minutes spent on coordinating with consultants, reviewing patient's note/diagnostic, reviewing discharge plan with patient's family, and finalize discharge order and summary.    Signed, Cyndi Bender, NP 05/14/2023, 1:56 PM

## 2023-05-14 NOTE — Progress Notes (Signed)
Advanced Heart Failure Rounding Note  Cardiologist: None   Chief Complaint: CHB  Subjective:    S/p PPM placement, site looks good. Feels hopeful for discharge tomorrow.    Objective:   Weight Range: 97.7 kg Body mass index is 29.21 kg/m.   Vital Signs:   Temp:  [97.8 F (36.6 C)-98.5 F (36.9 C)] 98.4 F (36.9 C) (02/01 0800) Pulse Rate:  [0-87] 75 (02/01 1000) Resp:  [11-29] 22 (02/01 1000) BP: (110-155)/(57-78) 133/69 (02/01 1000) SpO2:  [90 %-100 %] 95 % (02/01 1000) Last BM Date : 05/08/23  Weight change: Filed Weights   05/09/23 1114 05/11/23 0500 05/12/23 0430  Weight: 101.9 kg 98 kg 97.7 kg    Intake/Output:   Intake/Output Summary (Last 24 hours) at 05/14/2023 1052 Last data filed at 05/14/2023 0800 Gross per 24 hour  Intake 687.77 ml  Output 1050 ml  Net -362.23 ml      Physical Exam    General:  Fatigued, chronically ill appearing HEENT: Normal Neck: Supple.  Cor: Regular rate & rhythm. No rubs, gallops or murmurs. Lungs: Clear Abdomen: Soft, nontender, nondistended.  Extremities: No cyanosis, clubbing, rash, edema Neuro: Alert & oriented x 3. Affect pleasant.  Telemetry   SR 80s   Labs    CBC Recent Labs    05/13/23 0215 05/14/23 0244  WBC 9.2 7.3  HGB 11.1* 11.3*  HCT 34.0* 34.2*  MCV 82.5 82.8  PLT 328 282   Basic Metabolic Panel Recent Labs    14/78/29 0215 05/14/23 0244  NA 132* 135  K 4.0 4.0  CL 100 100  CO2 24 26  GLUCOSE 176* 177*  BUN 10 8  CREATININE 0.99 0.89  CALCIUM 8.9 9.1  MG 1.5* 2.4   Liver Function Tests No results for input(s): "AST", "ALT", "ALKPHOS", "BILITOT", "PROT", "ALBUMIN" in the last 72 hours.  No results for input(s): "LIPASE", "AMYLASE" in the last 72 hours. Cardiac Enzymes No results for input(s): "CKTOTAL", "CKMB", "CKMBINDEX", "TROPONINI" in the last 72 hours.  BNP: BNP (last 3 results) No results for input(s): "BNP" in the last 8760 hours.  ProBNP (last 3 results) No  results for input(s): "PROBNP" in the last 8760 hours.   D-Dimer No results for input(s): "DDIMER" in the last 72 hours. Hemoglobin A1C No results for input(s): "HGBA1C" in the last 72 hours. Fasting Lipid Panel No results for input(s): "CHOL", "HDL", "LDLCALC", "TRIG", "CHOLHDL", "LDLDIRECT" in the last 72 hours. Thyroid Function Tests No results for input(s): "TSH", "T4TOTAL", "T3FREE", "THYROIDAB" in the last 72 hours.  Invalid input(s): "FREET3"  Other results:   Medications:     Scheduled Medications:  atorvastatin  80 mg Oral QHS   buPROPion  75 mg Oral BID   Chlorhexidine Gluconate Cloth  6 each Topical Daily   feeding supplement  237 mL Oral BID BM   finasteride  5 mg Oral Daily   insulin aspart  0-5 Units Subcutaneous QHS   insulin aspart  0-9 Units Subcutaneous TID WC   insulin glargine-yfgn  10 Units Subcutaneous Daily   multivitamin with minerals  1 tablet Oral Daily   mupirocin ointment  1 Application Nasal BID   pantoprazole  40 mg Oral Daily   polyethylene glycol  17 g Oral BID   sodium chloride flush  3 mL Intravenous Q12H   tamsulosin  0.4 mg Oral Daily    Infusions:    PRN Medications: acetaminophen **OR** acetaminophen, benzonatate, guaiFENesin-dextromethorphan, ondansetron (ZOFRAN) IV  Patient Profile  John [name] is a 78 y.o. male with a PMH of RBBB, HTN, recent CVA, HTN, diabetes who is seen today for evaluation of intermittent complete heart block at the request of cardiology.   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 EF normal. S/p PPM placement 1/31 with Dr. Jimmey Ralph. Pressure bandage on site, no complications. - D/c venous sheath - Likely discharge with EP follow up tomorrow - Downgrade to floor status,  PT orders in, if ambulating can d/c tomorrow   Pulmonary embolism: Small, hemodynamically insignificant. Low risk of holding OAC, induced in the setting of recent hospital  stay. - Resume apixaban, will stay at 5mg  dose given small PE and recent procedure - Will need 3 months OAC   CVA: No residual deficits.  - Continue atorvastatin - Anticoagulation as above   Diabetes:  - Continue SSI   Hypertension:  - Holding home BP meds  Telemetry floor downgrade, d/c after PT eval tomorrow  Length of Stay: 4  Romie Minus, MD  05/14/2023, 10:52 AM  Advanced Heart Failure Team Pager [phone] (M-F; 7a - 5p)  Please contact [facility] for night-coverage after hours (5p -7a ) and weekends on [email or web address]

## 2023-05-14 NOTE — Progress Notes (Addendum)
   Patient Name: John [name] Date of Encounter: 05/14/2023 [facility] Cardiologist: None   Interval Summary  .    No acute overnight events. Pacer placed yesterday. No complaints.   Vital Signs .    Vitals:   05/14/23 0800 05/14/23 0900 05/14/23 1000 05/14/23 1129  BP: 126/75 120/68 133/69   Pulse: 75 79 75   Resp: 19 18 (!) 22   Temp: 98.4 F (36.9 C)   98.1 F (36.7 C)  TempSrc: Oral   Oral  SpO2: 94% 91% 95%   Weight:      Height:        Intake/Output Summary (Last 24 hours) at 05/14/2023 1253 Last data filed at 05/14/2023 0800 Gross per 24 hour  Intake 487.93 ml  Output 600 ml  Net -112.07 ml      05/12/2023    4:30 AM 05/11/2023    5:00 AM 05/09/2023   11:14 AM  Last 3 Weights  Weight (lbs) 215 lb 6.2 oz 216 lb 0.8 oz 224 lb 9.6 oz  Weight (kg) 97.7 kg 98 kg 101.878 kg      Telemetry/ECG    Sinus rhythm w/ RBBB - Personally Reviewed  Physical Exam .   GEN: No acute distress.   Neck: No JVD Cardiac: Normal rate, regular rhythm. Left chest pocket without hematoma or bleeding.  Respiratory: Clear to auscultation bilaterally. GI: Soft, nontender, non-distended  MS: No edema  Assessment & Plan .     Assessment: John [name] is a 78 y.o. male with a history of RBBB, HTN, breast cancer s/p radical mastectomy on tamoxifen, vertigo, chronic small vessel infarcts, DM II, and BPH who is being seen today for the evaluation of complete heart block at the request of Dr Royann Shivers. Patient presented to ED after a syncopal event at home. While admitted at [facility], had  an episode of intermittent complete heart block on telemetry. Transferred to [facility] and temporary pacing wire was placed. He has baseline conduction disease. Given intermittent complete heart block will plan for permanent pacer implant. He was also found to have a small PE. Will need to transition to oral anti-coagulation for pacer implant to minimize risk of pocket hematoma.   Problem  List: Intermittent complete heart block RBBB LPFB   Plan: - CXR with appropriate lead position and no pneumothorax.  - Okay to resume Eliquis this evening.  - Device check with appropriate function and stable lead parameters. RA - threshold 0.5V @0 .5ms, sensing 3.61mV, impedance 450 ohms RV - threshold 0.5V @0 .5ms, sensing >12.59mV, impedance 460 ohms - Okay for discharge from EP perspective. Avoid getting incision wet for 7 days. No heavy lifting with left arm for 6 weeks.    For questions or updates, please contact [facility] Please consult [email or web address] for contact info under     Signed, Nobie Putnam, MD

## 2023-05-14 NOTE — TOC Transition Note (Signed)
Transition of Care [facility]) - Discharge Note   Patient Details  Name: John [name] MRN: [id number] Date of Birth: 1945/08/21  Transition [facility] Care [facility]) CM/SW Contact:  Reva Bores, LCSWA Phone Number: 05/14/2023, 2:49 PM   Clinical Narrative:   CSW spoke with Sharia Reeve at [facility] who confirmed pts room #404A and the number to call for report is [phone].   PTAR called. Pts PTAR paperwork left on pts chart.       Barriers to Discharge: Continued Medical Work up, SNF Pending bed offer   Patient Goals and CMS Choice Patient states their goals for this hospitalization and ongoing recovery are:: To go to SNF          Discharge Placement                       Discharge Plan and Services Additional resources added to the After Visit Summary for   In-house Referral: Clinical Social Work Discharge Planning Services: NA Post Acute Care Choice: Skilled Nursing Facility          DME Arranged: N/A DME Agency: NA                  Social Drivers of Health (SDOH) Interventions SDOH Screenings   Food Insecurity: No Food Insecurity (05/10/2023)  Housing: Low Risk  (05/10/2023)  Transportation Needs: No Transportation Needs (05/10/2023)  Utilities: Not At Risk (05/10/2023)  Alcohol Screen: Low Risk  (01/27/2023)  Depression (PHQ2-9): Low Risk  (01/28/2023)  Financial Resource Strain: Low Risk  (04/07/2023)  Physical Activity: Unknown (04/07/2023)  Social Connections: Moderately Isolated (05/10/2023)  Stress: No Stress Concern Present (04/07/2023)  Tobacco Use: Low Risk  (05/08/2023)     Readmission Risk Interventions    05/11/2023   12:52 PM 04/22/2023   12:12 PM  Readmission Risk Prevention Plan  Medication Screening  Complete  Transportation Screening Complete Complete  PCP or Specialist Appt within 3-5 Days Complete   HRI or Home Care Consult Complete   Social Work Consult for Recovery Care Planning/Counseling Complete   Palliative Care Screening Not  Applicable   Medication Review Oceanographer) Complete

## 2023-05-14 NOTE — Progress Notes (Signed)
OT Cancellation Note  Patient Details Name: John [name] MRN: [id number] DOB: [birth year]   Cancelled Treatment:    Reason Eval/Treat Not Completed: Fatigue/lethargy limiting ability to participate (Per conversation with RN, pt not appropriate for OT at this time due to fatigue following PT session earlier this day. OT to reattempt to see pt at a later time as appropriate/available.)  [name] "[name]" M., OTR/L, MA Acute Rehab [phone]  [name] 05/14/2023, 2:24 PM

## 2023-05-16 ENCOUNTER — Encounter (HOSPITAL_COMMUNITY): Payer: Self-pay | Admitting: Cardiology

## 2023-05-16 DIAGNOSIS — D649 Anemia, unspecified: Secondary | ICD-10-CM | POA: Diagnosis not present

## 2023-05-16 DIAGNOSIS — E43 Unspecified severe protein-calorie malnutrition: Secondary | ICD-10-CM | POA: Diagnosis not present

## 2023-05-16 DIAGNOSIS — E119 Type 2 diabetes mellitus without complications: Secondary | ICD-10-CM | POA: Diagnosis not present

## 2023-05-17 ENCOUNTER — Telehealth: Payer: Self-pay | Admitting: Cardiology

## 2023-05-17 ENCOUNTER — Other Ambulatory Visit (HOSPITAL_COMMUNITY): Payer: Self-pay

## 2023-05-17 NOTE — Telephone Encounter (Signed)
 Spoke with the patient's wife who states that the patient was discharged from the hospital to [facility] rehab. She states that they are not letting him get up and walk around. She reports that she was told his was due to a blood clot in his leg. She is concerned that the patient is not going to get any better if he is not able to get up and move. Advised that she speak with someone at the facility about these concerns.  She states that the other day when he was up sitting on the side of the bed his heart rate was in the 50s and he felt weak and dizzy. Advised that they may also be concerned about him falling with recent history and hospitalization.  She also has questions about his pacemaker that was placed while he was in the hospital on 1/31. Would like to know about what kind of adjustments can be made and what his current parameters are. Advised that I would have a device nurse call her back to review these questions.  Wound check is scheduled for 2/12

## 2023-05-17 NOTE — Telephone Encounter (Signed)
Attempted to contact nurse at [facility] to assist with sending manual transmission. Left detailed message on nurse supervisor VM to call back so we can assist with sending manual transmission.

## 2023-05-17 NOTE — Telephone Encounter (Signed)
Wife would like a call back from a nurse to discuss him being in [facility] since 05-14-23 and they are telling her they can't do rehab on him because he has a blood clot. She just doesn't feel like she knows what all is going on.

## 2023-05-18 NOTE — Telephone Encounter (Signed)
 LM for [facility] Re[facility] charge nurse requesting call back if any needs related to Pt.  LM for wife requesting call back to answer any questions she has regarding Pt's pacemaker.  Transmission received and WNL.  Pt paces <1%.

## 2023-05-18 NOTE — Telephone Encounter (Signed)
 Pt returning call, requesting cb

## 2023-05-18 NOTE — Telephone Encounter (Signed)
 Pt wife called back letting us  know she will go to the nursing home today and send the transmission and will call us  if she needs help sending it

## 2023-05-18 NOTE — Telephone Encounter (Signed)
 Spoke with [facility] nurse and they are going to send transmission

## 2023-05-19 NOTE — Telephone Encounter (Signed)
 Spoke with wife yesterday 05/18/2023 regarding concerns she had in reference to Pt.  Pt is currently at [facility].  Per wife, there was an episode where Pt was attempting to be transferred (unsure if nurse or physical therapy) and Pt complained of feeling weak.  Per wife, Pt's blood pressure and pulse was checked with an automatic wrist cuff and his pulse was determined to be 55 bpm. After that incident, per the wife, the facility put the Pt on bedrest.  Pt's wife is concerned that Pt will not get any stronger if he is not being allowed out of the bed.  Transmission received and WNL.  Pt rarely paces in the A or V.  <1% for each.  Histograms seem appropriate.  Per wife rehab physician was to be rounding at the facility on 05/19/2023 between 1:30 pm and 2:30 pm.  Attempted to contact wife just now and no answer.  Left message to return call.  Will attempt to discover plan of care for Pt and assist as needed to improve Pt's mobility.

## 2023-05-20 ENCOUNTER — Other Ambulatory Visit (HOSPITAL_COMMUNITY): Payer: Self-pay

## 2023-05-23 ENCOUNTER — Other Ambulatory Visit (HOSPITAL_COMMUNITY): Payer: Self-pay

## 2023-05-23 NOTE — Telephone Encounter (Signed)
 Spoke with wife last Thursday.  Pt has resumed physical therapy and she does not have any further concerns at this time.

## 2023-05-24 ENCOUNTER — Telehealth: Payer: Self-pay | Admitting: Cardiology

## 2023-05-24 NOTE — Telephone Encounter (Signed)
Patients wife, Larene Beach states that she was told patient had a stroke while still at [facility] - this was before the patient was transferred to [facility] for PPM Implant. She is wondering to see if there is a Neurology group already assigned to the patient so that she can get an appointment made for follow up. She would like any recommendations Dr. Jimmey Ralph has.

## 2023-05-24 NOTE — Telephone Encounter (Signed)
Spoke with the patient's wife who would like to know whether the patient needs to see a neurologist to follow up from his recent hospital stay. She states that they were told that he had a stroke. It looks like neurology consulted on the patient in the hospital and believed it to be evolution of an know infarct that didn't require further workup. It looks like referral was originally placed to neurology then cancelled. Wife states that she is still concerned about patient's confusion. She is going to reach out to his PCP. Patient is currently at [facility]

## 2023-05-25 ENCOUNTER — Ambulatory Visit: Payer: Medicare Other

## 2023-05-26 ENCOUNTER — Ambulatory Visit: Payer: Medicare Other | Attending: Cardiology

## 2023-05-26 ENCOUNTER — Ambulatory Visit: Payer: Medicare Other

## 2023-05-26 DIAGNOSIS — I442 Atrioventricular block, complete: Secondary | ICD-10-CM

## 2023-05-26 LAB — CUP PACEART INCLINIC DEVICE CHECK
Battery Remaining Longevity: 142 mo
Battery Voltage: 3.07 V
Brady Statistic RA Percent Paced: 0.31 %
Brady Statistic RV Percent Paced: 0.15 %
Date Time Interrogation Session: 20250213125921
Implantable Lead Connection Status: 753985
Implantable Lead Connection Status: 753985
Implantable Lead Implant Date: 20250131
Implantable Lead Implant Date: 20250131
Implantable Lead Location: 753859
Implantable Lead Location: 753860
Implantable Pulse Generator Implant Date: 20250131
Lead Channel Impedance Value: 462.5 Ohm
Lead Channel Impedance Value: 475 Ohm
Lead Channel Pacing Threshold Amplitude: 0.625 V
Lead Channel Pacing Threshold Amplitude: 0.75 V
Lead Channel Pacing Threshold Pulse Width: 0.5 ms
Lead Channel Pacing Threshold Pulse Width: 0.5 ms
Lead Channel Sensing Intrinsic Amplitude: 4.3 mV
Lead Channel Sensing Intrinsic Amplitude: 5.4 mV
Lead Channel Setting Pacing Amplitude: 1 V
Lead Channel Setting Pacing Amplitude: 1.625
Lead Channel Setting Pacing Pulse Width: 0.5 ms
Lead Channel Setting Sensing Sensitivity: 2 mV
Pulse Gen Model: 2272
Pulse Gen Serial Number: 8241762

## 2023-05-26 NOTE — Patient Instructions (Addendum)
After Your Pacemaker   Monitor your pacemaker site for redness, swelling, and drainage. Call the device clinic at [phone] if you experience these symptoms or fever/chills.  Your incision was closed with Steri-strips or staples:  You may shower 7 days after your procedure and wash your incision with soap and water. Avoid lotions, ointments, or perfumes over your incision until it is well-healed.  You may use a hot tub or a pool after your wound check appointment if the incision is completely closed.  Do not lift, push or pull greater than 10 pounds with the affected arm until 6 weeks after your procedure. There are no other restrictions in arm movement after your wound check appointment.  You may drive, unless driving has been restricted by your healthcare providers.  Remote monitoring is used to monitor your pacemaker from home. This monitoring is scheduled every 91 days by our office. It allows [place] to keep an eye on the functioning of your device to ensure it is working properly. You will routinely see your Electrophysiologist annually (more often if necessary).

## 2023-05-26 NOTE — Progress Notes (Signed)
Normal Pacemaker wound check. Wound well healed. Thresholds, sensing, and impedances consistent with implant measurements and at 3.5V safety margin/auto capture until 3 month visit. No episodes. 1 PMT event noted. Reviewed arm restrictions to continue for 6 weeks total post op.  Pt enrolled in remote follow-up.

## 2023-05-30 ENCOUNTER — Other Ambulatory Visit (HOSPITAL_COMMUNITY): Payer: Self-pay

## 2023-05-31 DIAGNOSIS — E119 Type 2 diabetes mellitus without complications: Secondary | ICD-10-CM | POA: Diagnosis not present

## 2023-05-31 DIAGNOSIS — Z9889 Other specified postprocedural states: Secondary | ICD-10-CM

## 2023-05-31 DIAGNOSIS — I442 Atrioventricular block, complete: Secondary | ICD-10-CM | POA: Diagnosis not present

## 2023-05-31 DIAGNOSIS — F325 Major depressive disorder, single episode, in full remission: Secondary | ICD-10-CM | POA: Diagnosis not present

## 2023-05-31 DIAGNOSIS — D649 Anemia, unspecified: Secondary | ICD-10-CM | POA: Diagnosis not present

## 2023-06-01 ENCOUNTER — Telehealth: Payer: Self-pay

## 2023-06-01 DIAGNOSIS — Z9181 History of falling: Secondary | ICD-10-CM | POA: Diagnosis not present

## 2023-06-01 DIAGNOSIS — E1140 Type 2 diabetes mellitus with diabetic neuropathy, unspecified: Secondary | ICD-10-CM | POA: Diagnosis not present

## 2023-06-01 DIAGNOSIS — Z794 Long term (current) use of insulin: Secondary | ICD-10-CM | POA: Diagnosis not present

## 2023-06-01 DIAGNOSIS — Z950 Presence of cardiac pacemaker: Secondary | ICD-10-CM | POA: Diagnosis not present

## 2023-06-01 DIAGNOSIS — E46 Unspecified protein-calorie malnutrition: Secondary | ICD-10-CM | POA: Diagnosis not present

## 2023-06-01 DIAGNOSIS — I959 Hypotension, unspecified: Secondary | ICD-10-CM | POA: Diagnosis not present

## 2023-06-01 DIAGNOSIS — Z8673 Personal history of transient ischemic attack (TIA), and cerebral infarction without residual deficits: Secondary | ICD-10-CM | POA: Diagnosis not present

## 2023-06-01 DIAGNOSIS — Z7901 Long term (current) use of anticoagulants: Secondary | ICD-10-CM | POA: Diagnosis not present

## 2023-06-01 DIAGNOSIS — F32A Depression, unspecified: Secondary | ICD-10-CM | POA: Diagnosis not present

## 2023-06-01 DIAGNOSIS — I10 Essential (primary) hypertension: Secondary | ICD-10-CM | POA: Diagnosis not present

## 2023-06-01 DIAGNOSIS — G5600 Carpal tunnel syndrome, unspecified upper limb: Secondary | ICD-10-CM | POA: Diagnosis not present

## 2023-06-01 DIAGNOSIS — E7800 Pure hypercholesterolemia, unspecified: Secondary | ICD-10-CM | POA: Diagnosis not present

## 2023-06-01 DIAGNOSIS — Z7984 Long term (current) use of oral hypoglycemic drugs: Secondary | ICD-10-CM | POA: Diagnosis not present

## 2023-06-01 DIAGNOSIS — Z556 Problems related to health literacy: Secondary | ICD-10-CM | POA: Diagnosis not present

## 2023-06-01 DIAGNOSIS — N400 Enlarged prostate without lower urinary tract symptoms: Secondary | ICD-10-CM | POA: Diagnosis not present

## 2023-06-01 DIAGNOSIS — K219 Gastro-esophageal reflux disease without esophagitis: Secondary | ICD-10-CM | POA: Diagnosis not present

## 2023-06-01 NOTE — Transitions of Care (Post Inpatient/ED Visit) (Signed)
 06/01/2023  Name: John [name] MRN: [id number] DOB: [birth year]  Today's TOC FU Call Status: Today's TOC FU Call Status:: Successful TOC FU Call Completed TOC FU Call Complete Date: 06/01/23 Patient's Name and Date of Birth confirmed.  Transition Care Management Follow-up Telephone Call Discharge Facility: Other ([facility]) Name of Other ([facility]) Discharge Facility: [facility] Type of Discharge: Inpatient Admission Primary Inpatient Discharge Diagnosis:: anemia How have you been since you were released from the hospital?: Better  Items Reviewed: Did you receive and understand the discharge instructions provided?: No Medications obtained,verified, and reconciled?: Yes (Medications Reviewed) Any new allergies since your discharge?: No Dietary orders reviewed?: Yes Do you have support at home?: Yes People in Home: child(ren), adult  Medications Reviewed Today: Medications Reviewed Today     Reviewed by Karena Addison, LPN (Licensed Practical Nurse) on 06/01/23 at [phone]  Med List Status: <None>   Medication Order Taking? Sig Documenting Provider Last Dose Status Informant  acetaminophen (TYLENOL) 650 MG CR tablet [id number]  Take 1 tablet (650 mg total) by mouth every 8 (eight) hours as needed for pain. Cyndi Bender, NP  Active   amLODipine (NORVASC) 5 MG tablet [id number] No Take 1 tablet (5 mg total) by mouth daily. Charlton Amor, PA-C 05/08/2023 Active Self, Pharmacy Records  apixaban (ELIQUIS) 5 MG TABS tablet [id number]  Take 1 tablet (5 mg total) by mouth 2 (two) times daily. Cyndi Bender, NP  Active   Ascorbic Acid (VITAMIN C) 1000 MG tablet [id number] No Take 1,000 mg by mouth 3 (three) times a week. [provider] Past Week Active Self, Pharmacy Records  atorvastatin (LIPITOR) 80 MG tablet [id number] No Take 1 tablet (80 mg total) by mouth at bedtime. Charlton Amor, PA-C 05/07/2023 Active Self, Pharmacy Records  Blood Glucose Monitoring Suppl  (ACCU-CHEK GUIDE) w/Device KIT [id number] No Use daily to check blood sugar.  DX E11.69 Sharlene Dory, DO Taking Active Self, Pharmacy Records  buPROPion [facility]) 75 MG tablet [id number] No Take 1 tablet (75 mg total) by mouth 2 (two) times daily. Charlton Amor, PA-C 05/08/2023 Morning Active Self, Pharmacy Records  finasteride (PROSCAR) 5 MG tablet [id number] No Take 1 tablet (5 mg total) by mouth daily. Charlton Amor, PA-C 05/08/2023 Active Self, Pharmacy Records  guaiFENesin (ROBITUSSIN) 100 MG/5ML liquid [id number]  Take 10 mLs by mouth every 6 (six) hours as needed for cough or to loosen phlegm. Angiulli, Mcarthur Rossetti, PA-C  Active Self, Pharmacy Records  insulin glargine, 2 Unit Dial, (TOUJEO MAX) 300 UNIT/ML Solostar Pen [id number] No Inject 45 units before breakfast and supper. Charlton Amor, PA-C 05/08/2023 Morning Active Self, Pharmacy Records  insulin lispro (HUMALOG) 200 UNIT/ML KwikPen [id number]  Inject 25 Units into the skin daily at 6 (six) AM. Angiulli, Mcarthur Rossetti, PA-C  Active Self, Pharmacy Records  Lancets Misc. (ACCU-CHEK SOFTCLIX LANCET DEV) KIT [id number] No Use daily to check blood sugar.  DX E11.69 [provider] Taking Active Self, Pharmacy Records  meclizine (ANTIVERT) 12.5 MG tablet [id number] No Take 1 tablet (12.5 mg total) by mouth 3 (three) times daily as needed for dizziness. Angiulli, Mcarthur Rossetti, PA-C Past Week Active Self, Pharmacy Records  metFORMIN (GLUCOPHAGE-XR) 500 MG 24 hr tablet [id number] No Take 2 tablets (1,000 mg total) by mouth 2 (two) times daily with a meal. Charlton Amor, PA-C 05/08/2023 Morning Active Self, Pharmacy Records  Multiple Vitamin (MULTIVITAMIN) tablet [id number] No Take 1 tablet by mouth 2 (two) times a week. [provider]  Past Week Active Self, Pharmacy Records  pantoprazole (PROTONIX) 40 MG tablet [id number] No Take 1 tablet (40 mg total) by mouth daily. (Take in place of omeprazole while taking clopidogrel.)  Charlton Amor, PA-C 05/08/2023 Active Self, Pharmacy Records  polyethylene glycol (MIRALAX / GLYCOLAX) 17 g packet [id number] No Take 17 g by mouth 2 (two) times daily. Hartley Barefoot A, MD 05/08/2023 Morning Active Self, Pharmacy Records  tamsulosin (FLOMAX) 0.4 MG CAPS capsule [id number] No Take 1 capsule (0.4 mg total) by mouth daily. Charlton Amor, PA-C 05/08/2023 Active Self, Pharmacy Records  Triamcinolone Acetonide (TRIAMCINOLONE 0.1 % CREAM : EUCERIN) CREA [id number] No Apply 1 Application topically 2 (two) times daily. Alba Cory, MD Past Week Active Self, Pharmacy Records            Home Care and Equipment/Supplies: Were Home Health Services Ordered?: Yes Name of Home Health Agency:: unknown Has Agency set up a time to come to your home?: Yes First Home Health Visit Date: 06/01/23 Any new equipment or medical supplies ordered?: NA  Functional Questionnaire: Do you need assistance with bathing/showering or dressing?: Yes Do you need assistance with meal preparation?: No Do you need assistance with eating?: No Do you have difficulty maintaining continence: No Do you need assistance with getting out of bed/getting out of a chair/moving?: No Do you have difficulty managing or taking your medications?: Yes  Follow up appointments reviewed: PCP Follow-up appointment confirmed?: Yes Date of PCP follow-up appointment?: 06/21/23 Follow-up Provider: [facility] Follow-up appointment confirmed?: NA Do you need transportation to your follow-up appointment?: No Do you understand care options if your condition(s) worsen?: Yes-patient verbalized understanding    SIGNATURE Karena Addison, LPN [facility] Nurse Health Advisor Direct Dial [phone]

## 2023-06-02 ENCOUNTER — Other Ambulatory Visit: Payer: Self-pay

## 2023-06-02 DIAGNOSIS — K219 Gastro-esophageal reflux disease without esophagitis: Secondary | ICD-10-CM | POA: Diagnosis not present

## 2023-06-02 DIAGNOSIS — G5600 Carpal tunnel syndrome, unspecified upper limb: Secondary | ICD-10-CM | POA: Diagnosis not present

## 2023-06-02 DIAGNOSIS — Z7901 Long term (current) use of anticoagulants: Secondary | ICD-10-CM | POA: Diagnosis not present

## 2023-06-02 DIAGNOSIS — Z794 Long term (current) use of insulin: Secondary | ICD-10-CM | POA: Diagnosis not present

## 2023-06-02 DIAGNOSIS — E7800 Pure hypercholesterolemia, unspecified: Secondary | ICD-10-CM | POA: Diagnosis not present

## 2023-06-02 DIAGNOSIS — Z7984 Long term (current) use of oral hypoglycemic drugs: Secondary | ICD-10-CM | POA: Diagnosis not present

## 2023-06-02 DIAGNOSIS — I10 Essential (primary) hypertension: Secondary | ICD-10-CM | POA: Diagnosis not present

## 2023-06-02 DIAGNOSIS — E1140 Type 2 diabetes mellitus with diabetic neuropathy, unspecified: Secondary | ICD-10-CM | POA: Diagnosis not present

## 2023-06-02 DIAGNOSIS — Z950 Presence of cardiac pacemaker: Secondary | ICD-10-CM | POA: Diagnosis not present

## 2023-06-02 DIAGNOSIS — Z9181 History of falling: Secondary | ICD-10-CM | POA: Diagnosis not present

## 2023-06-02 DIAGNOSIS — E46 Unspecified protein-calorie malnutrition: Secondary | ICD-10-CM | POA: Diagnosis not present

## 2023-06-02 DIAGNOSIS — N400 Enlarged prostate without lower urinary tract symptoms: Secondary | ICD-10-CM | POA: Diagnosis not present

## 2023-06-02 DIAGNOSIS — I959 Hypotension, unspecified: Secondary | ICD-10-CM | POA: Diagnosis not present

## 2023-06-02 DIAGNOSIS — Z8673 Personal history of transient ischemic attack (TIA), and cerebral infarction without residual deficits: Secondary | ICD-10-CM | POA: Diagnosis not present

## 2023-06-02 DIAGNOSIS — Z556 Problems related to health literacy: Secondary | ICD-10-CM | POA: Diagnosis not present

## 2023-06-02 DIAGNOSIS — F32A Depression, unspecified: Secondary | ICD-10-CM | POA: Diagnosis not present

## 2023-06-03 ENCOUNTER — Telehealth: Payer: Self-pay

## 2023-06-03 DIAGNOSIS — N400 Enlarged prostate without lower urinary tract symptoms: Secondary | ICD-10-CM | POA: Diagnosis not present

## 2023-06-03 DIAGNOSIS — G5600 Carpal tunnel syndrome, unspecified upper limb: Secondary | ICD-10-CM | POA: Diagnosis not present

## 2023-06-03 DIAGNOSIS — Z950 Presence of cardiac pacemaker: Secondary | ICD-10-CM | POA: Diagnosis not present

## 2023-06-03 DIAGNOSIS — K219 Gastro-esophageal reflux disease without esophagitis: Secondary | ICD-10-CM | POA: Diagnosis not present

## 2023-06-03 DIAGNOSIS — Z556 Problems related to health literacy: Secondary | ICD-10-CM | POA: Diagnosis not present

## 2023-06-03 DIAGNOSIS — Z8673 Personal history of transient ischemic attack (TIA), and cerebral infarction without residual deficits: Secondary | ICD-10-CM | POA: Diagnosis not present

## 2023-06-03 DIAGNOSIS — Z7984 Long term (current) use of oral hypoglycemic drugs: Secondary | ICD-10-CM | POA: Diagnosis not present

## 2023-06-03 DIAGNOSIS — E1140 Type 2 diabetes mellitus with diabetic neuropathy, unspecified: Secondary | ICD-10-CM | POA: Diagnosis not present

## 2023-06-03 DIAGNOSIS — I10 Essential (primary) hypertension: Secondary | ICD-10-CM | POA: Diagnosis not present

## 2023-06-03 DIAGNOSIS — E46 Unspecified protein-calorie malnutrition: Secondary | ICD-10-CM | POA: Diagnosis not present

## 2023-06-03 DIAGNOSIS — Z9181 History of falling: Secondary | ICD-10-CM | POA: Diagnosis not present

## 2023-06-03 DIAGNOSIS — F32A Depression, unspecified: Secondary | ICD-10-CM | POA: Diagnosis not present

## 2023-06-03 DIAGNOSIS — Z7901 Long term (current) use of anticoagulants: Secondary | ICD-10-CM | POA: Diagnosis not present

## 2023-06-03 DIAGNOSIS — E7800 Pure hypercholesterolemia, unspecified: Secondary | ICD-10-CM | POA: Diagnosis not present

## 2023-06-03 DIAGNOSIS — Z794 Long term (current) use of insulin: Secondary | ICD-10-CM | POA: Diagnosis not present

## 2023-06-03 DIAGNOSIS — I959 Hypotension, unspecified: Secondary | ICD-10-CM | POA: Diagnosis not present

## 2023-06-03 NOTE — Telephone Encounter (Signed)
 Copied from CRM [id number]. Topic: Clinical - Medical Advice >> Jun 03, 2023 10:20 AM Almira Coaster wrote: Reason for CRM: Patient wife is calling because patient was recently released from a rehab center and she noticed he has an open wound on his buttocks, she can't bring patient into the office today and was wondering what Dr.Wendling recommends putting on it. She would also like to know what stool softener he can use to assist with going to the bathroom.

## 2023-06-03 NOTE — Telephone Encounter (Signed)
 Called spoke with pt wife was advised to try triple antibiotic ointment like Neosporin is a safe choice. Pt wife stated Understand.

## 2023-06-03 NOTE — Telephone Encounter (Signed)
 Pt appt moved up to Tuesday and Pt wife contact Laurena Bering and the nurse is suppose to be coming out to the home To check on pt.

## 2023-06-03 NOTE — Telephone Encounter (Signed)
 Called spoke with [facility] and verbal order was given for PT. Copied from CRM [id number]. Topic: Clinical - Home Health Verbal Orders >> Jun 03, 2023  1:10 PM [place] A wrote: Caller/Agency: [facility] Callback Number: [phone] Service Requested: Physical Therapy Frequency: 1 week 1, 2 week 3, 1 week 5 Any new concerns about the patient? Yes Patient has bedsore on patients bottom

## 2023-06-04 DIAGNOSIS — Z9181 History of falling: Secondary | ICD-10-CM | POA: Diagnosis not present

## 2023-06-04 DIAGNOSIS — K219 Gastro-esophageal reflux disease without esophagitis: Secondary | ICD-10-CM | POA: Diagnosis not present

## 2023-06-04 DIAGNOSIS — N400 Enlarged prostate without lower urinary tract symptoms: Secondary | ICD-10-CM | POA: Diagnosis not present

## 2023-06-04 DIAGNOSIS — Z8673 Personal history of transient ischemic attack (TIA), and cerebral infarction without residual deficits: Secondary | ICD-10-CM | POA: Diagnosis not present

## 2023-06-04 DIAGNOSIS — I959 Hypotension, unspecified: Secondary | ICD-10-CM | POA: Diagnosis not present

## 2023-06-04 DIAGNOSIS — E46 Unspecified protein-calorie malnutrition: Secondary | ICD-10-CM | POA: Diagnosis not present

## 2023-06-04 DIAGNOSIS — Z556 Problems related to health literacy: Secondary | ICD-10-CM | POA: Diagnosis not present

## 2023-06-04 DIAGNOSIS — I10 Essential (primary) hypertension: Secondary | ICD-10-CM | POA: Diagnosis not present

## 2023-06-04 DIAGNOSIS — Z7984 Long term (current) use of oral hypoglycemic drugs: Secondary | ICD-10-CM | POA: Diagnosis not present

## 2023-06-04 DIAGNOSIS — F32A Depression, unspecified: Secondary | ICD-10-CM | POA: Diagnosis not present

## 2023-06-04 DIAGNOSIS — E7800 Pure hypercholesterolemia, unspecified: Secondary | ICD-10-CM | POA: Diagnosis not present

## 2023-06-04 DIAGNOSIS — Z794 Long term (current) use of insulin: Secondary | ICD-10-CM | POA: Diagnosis not present

## 2023-06-04 DIAGNOSIS — G5600 Carpal tunnel syndrome, unspecified upper limb: Secondary | ICD-10-CM | POA: Diagnosis not present

## 2023-06-04 DIAGNOSIS — E1140 Type 2 diabetes mellitus with diabetic neuropathy, unspecified: Secondary | ICD-10-CM | POA: Diagnosis not present

## 2023-06-04 DIAGNOSIS — Z7901 Long term (current) use of anticoagulants: Secondary | ICD-10-CM | POA: Diagnosis not present

## 2023-06-04 DIAGNOSIS — Z950 Presence of cardiac pacemaker: Secondary | ICD-10-CM | POA: Diagnosis not present

## 2023-06-06 DIAGNOSIS — Z7901 Long term (current) use of anticoagulants: Secondary | ICD-10-CM | POA: Diagnosis not present

## 2023-06-06 DIAGNOSIS — I959 Hypotension, unspecified: Secondary | ICD-10-CM | POA: Diagnosis not present

## 2023-06-06 DIAGNOSIS — E1140 Type 2 diabetes mellitus with diabetic neuropathy, unspecified: Secondary | ICD-10-CM | POA: Diagnosis not present

## 2023-06-06 DIAGNOSIS — K219 Gastro-esophageal reflux disease without esophagitis: Secondary | ICD-10-CM | POA: Diagnosis not present

## 2023-06-06 DIAGNOSIS — E46 Unspecified protein-calorie malnutrition: Secondary | ICD-10-CM | POA: Diagnosis not present

## 2023-06-06 DIAGNOSIS — G5600 Carpal tunnel syndrome, unspecified upper limb: Secondary | ICD-10-CM | POA: Diagnosis not present

## 2023-06-06 DIAGNOSIS — N400 Enlarged prostate without lower urinary tract symptoms: Secondary | ICD-10-CM | POA: Diagnosis not present

## 2023-06-06 DIAGNOSIS — Z794 Long term (current) use of insulin: Secondary | ICD-10-CM | POA: Diagnosis not present

## 2023-06-06 DIAGNOSIS — Z7984 Long term (current) use of oral hypoglycemic drugs: Secondary | ICD-10-CM | POA: Diagnosis not present

## 2023-06-06 DIAGNOSIS — Z9181 History of falling: Secondary | ICD-10-CM | POA: Diagnosis not present

## 2023-06-06 DIAGNOSIS — Z556 Problems related to health literacy: Secondary | ICD-10-CM | POA: Diagnosis not present

## 2023-06-06 DIAGNOSIS — F32A Depression, unspecified: Secondary | ICD-10-CM | POA: Diagnosis not present

## 2023-06-06 DIAGNOSIS — I10 Essential (primary) hypertension: Secondary | ICD-10-CM | POA: Diagnosis not present

## 2023-06-06 DIAGNOSIS — Z950 Presence of cardiac pacemaker: Secondary | ICD-10-CM | POA: Diagnosis not present

## 2023-06-06 DIAGNOSIS — E7800 Pure hypercholesterolemia, unspecified: Secondary | ICD-10-CM | POA: Diagnosis not present

## 2023-06-06 DIAGNOSIS — Z8673 Personal history of transient ischemic attack (TIA), and cerebral infarction without residual deficits: Secondary | ICD-10-CM | POA: Diagnosis not present

## 2023-06-07 ENCOUNTER — Ambulatory Visit: Payer: Medicare Other | Admitting: Family Medicine

## 2023-06-07 ENCOUNTER — Telehealth: Payer: Self-pay

## 2023-06-07 NOTE — Telephone Encounter (Signed)
 Noted. Reasonable rec in meanwhile until we see him.

## 2023-06-07 NOTE — Telephone Encounter (Signed)
 Copied from CRM [id number]. Topic: General - Other >> Jun 07, 2023  4:49 PM Eunice Blase wrote: Reason for CRM: Receiving call from [facility] per [place] [phone] would like to relay information regarding pt. He has diarrhea for the last week twice a day. She reccommended a probiotic.

## 2023-06-08 ENCOUNTER — Inpatient Hospital Stay: Payer: Medicare Other | Admitting: Family Medicine

## 2023-06-08 DIAGNOSIS — G5600 Carpal tunnel syndrome, unspecified upper limb: Secondary | ICD-10-CM | POA: Diagnosis not present

## 2023-06-08 DIAGNOSIS — K219 Gastro-esophageal reflux disease without esophagitis: Secondary | ICD-10-CM | POA: Diagnosis not present

## 2023-06-08 DIAGNOSIS — Z794 Long term (current) use of insulin: Secondary | ICD-10-CM | POA: Diagnosis not present

## 2023-06-08 DIAGNOSIS — Z556 Problems related to health literacy: Secondary | ICD-10-CM | POA: Diagnosis not present

## 2023-06-08 DIAGNOSIS — Z950 Presence of cardiac pacemaker: Secondary | ICD-10-CM | POA: Diagnosis not present

## 2023-06-08 DIAGNOSIS — I10 Essential (primary) hypertension: Secondary | ICD-10-CM | POA: Diagnosis not present

## 2023-06-08 DIAGNOSIS — F32A Depression, unspecified: Secondary | ICD-10-CM | POA: Diagnosis not present

## 2023-06-08 DIAGNOSIS — E46 Unspecified protein-calorie malnutrition: Secondary | ICD-10-CM | POA: Diagnosis not present

## 2023-06-08 DIAGNOSIS — E7800 Pure hypercholesterolemia, unspecified: Secondary | ICD-10-CM | POA: Diagnosis not present

## 2023-06-08 DIAGNOSIS — Z9181 History of falling: Secondary | ICD-10-CM | POA: Diagnosis not present

## 2023-06-08 DIAGNOSIS — N400 Enlarged prostate without lower urinary tract symptoms: Secondary | ICD-10-CM | POA: Diagnosis not present

## 2023-06-08 DIAGNOSIS — Z8673 Personal history of transient ischemic attack (TIA), and cerebral infarction without residual deficits: Secondary | ICD-10-CM | POA: Diagnosis not present

## 2023-06-08 DIAGNOSIS — I959 Hypotension, unspecified: Secondary | ICD-10-CM | POA: Diagnosis not present

## 2023-06-08 DIAGNOSIS — E1140 Type 2 diabetes mellitus with diabetic neuropathy, unspecified: Secondary | ICD-10-CM | POA: Diagnosis not present

## 2023-06-08 DIAGNOSIS — Z7984 Long term (current) use of oral hypoglycemic drugs: Secondary | ICD-10-CM | POA: Diagnosis not present

## 2023-06-08 DIAGNOSIS — Z7901 Long term (current) use of anticoagulants: Secondary | ICD-10-CM | POA: Diagnosis not present

## 2023-06-10 ENCOUNTER — Telehealth: Payer: Self-pay | Admitting: Cardiology

## 2023-06-10 DIAGNOSIS — I10 Essential (primary) hypertension: Secondary | ICD-10-CM | POA: Diagnosis not present

## 2023-06-10 DIAGNOSIS — E1140 Type 2 diabetes mellitus with diabetic neuropathy, unspecified: Secondary | ICD-10-CM | POA: Diagnosis not present

## 2023-06-10 DIAGNOSIS — Z8673 Personal history of transient ischemic attack (TIA), and cerebral infarction without residual deficits: Secondary | ICD-10-CM | POA: Diagnosis not present

## 2023-06-10 DIAGNOSIS — Z7901 Long term (current) use of anticoagulants: Secondary | ICD-10-CM | POA: Diagnosis not present

## 2023-06-10 DIAGNOSIS — E7800 Pure hypercholesterolemia, unspecified: Secondary | ICD-10-CM | POA: Diagnosis not present

## 2023-06-10 DIAGNOSIS — E46 Unspecified protein-calorie malnutrition: Secondary | ICD-10-CM | POA: Diagnosis not present

## 2023-06-10 DIAGNOSIS — Z950 Presence of cardiac pacemaker: Secondary | ICD-10-CM | POA: Diagnosis not present

## 2023-06-10 DIAGNOSIS — Z556 Problems related to health literacy: Secondary | ICD-10-CM | POA: Diagnosis not present

## 2023-06-10 DIAGNOSIS — K219 Gastro-esophageal reflux disease without esophagitis: Secondary | ICD-10-CM | POA: Diagnosis not present

## 2023-06-10 DIAGNOSIS — N400 Enlarged prostate without lower urinary tract symptoms: Secondary | ICD-10-CM | POA: Diagnosis not present

## 2023-06-10 DIAGNOSIS — I959 Hypotension, unspecified: Secondary | ICD-10-CM | POA: Diagnosis not present

## 2023-06-10 DIAGNOSIS — Z9181 History of falling: Secondary | ICD-10-CM | POA: Diagnosis not present

## 2023-06-10 DIAGNOSIS — G5600 Carpal tunnel syndrome, unspecified upper limb: Secondary | ICD-10-CM | POA: Diagnosis not present

## 2023-06-10 DIAGNOSIS — F32A Depression, unspecified: Secondary | ICD-10-CM | POA: Diagnosis not present

## 2023-06-10 DIAGNOSIS — Z7984 Long term (current) use of oral hypoglycemic drugs: Secondary | ICD-10-CM | POA: Diagnosis not present

## 2023-06-10 DIAGNOSIS — Z794 Long term (current) use of insulin: Secondary | ICD-10-CM | POA: Diagnosis not present

## 2023-06-10 NOTE — Telephone Encounter (Signed)
 Spoke to pt and relayed information from device clinic. Advised pt to hold his amlodipine in the morning if his BP is under 110/80. Pt was also advised to make sure he's drinking enough fluids. Pt was told his concerns would be forwarded to Dr. Jimmey Ralph and his nurse.

## 2023-06-10 NOTE — Telephone Encounter (Signed)
 Spoke with pt regarding his dizziness. Pt states he is currently at home with a physical therapist who took his BP sitting (122/58) and standing (86/60). The pt is only having dizziness when he stands up and it has been lasting about 15 seconds or so. Pt was told that his concerns would be forwarded to device clinic.

## 2023-06-10 NOTE — Telephone Encounter (Signed)
 STAT if patient feels like he/she is going to faint   Are you dizzy now? yes  Do you feel faint or have you passed out? no  Do you have any other symptoms?    Have you checked your HR and BP (record if available)? 122/58 sitting standing 86/60

## 2023-06-10 NOTE — Telephone Encounter (Signed)
 Remote transmission received. Normal device function. Presenting rhythm, VS 99 bpm. No alerts. Patient updated on results and no findings noted on remote to associate with symptoms. Routing to triage to advise further. PT aware.

## 2023-06-14 ENCOUNTER — Ambulatory Visit: Payer: Self-pay | Admitting: Family Medicine

## 2023-06-14 DIAGNOSIS — Z950 Presence of cardiac pacemaker: Secondary | ICD-10-CM | POA: Diagnosis not present

## 2023-06-14 DIAGNOSIS — Z8673 Personal history of transient ischemic attack (TIA), and cerebral infarction without residual deficits: Secondary | ICD-10-CM | POA: Diagnosis not present

## 2023-06-14 DIAGNOSIS — Z7984 Long term (current) use of oral hypoglycemic drugs: Secondary | ICD-10-CM | POA: Diagnosis not present

## 2023-06-14 DIAGNOSIS — E46 Unspecified protein-calorie malnutrition: Secondary | ICD-10-CM | POA: Diagnosis not present

## 2023-06-14 DIAGNOSIS — F32A Depression, unspecified: Secondary | ICD-10-CM | POA: Diagnosis not present

## 2023-06-14 DIAGNOSIS — Z9181 History of falling: Secondary | ICD-10-CM | POA: Diagnosis not present

## 2023-06-14 DIAGNOSIS — Z7901 Long term (current) use of anticoagulants: Secondary | ICD-10-CM | POA: Diagnosis not present

## 2023-06-14 DIAGNOSIS — E7800 Pure hypercholesterolemia, unspecified: Secondary | ICD-10-CM | POA: Diagnosis not present

## 2023-06-14 DIAGNOSIS — N400 Enlarged prostate without lower urinary tract symptoms: Secondary | ICD-10-CM | POA: Diagnosis not present

## 2023-06-14 DIAGNOSIS — G5600 Carpal tunnel syndrome, unspecified upper limb: Secondary | ICD-10-CM | POA: Diagnosis not present

## 2023-06-14 DIAGNOSIS — Z794 Long term (current) use of insulin: Secondary | ICD-10-CM | POA: Diagnosis not present

## 2023-06-14 DIAGNOSIS — E1140 Type 2 diabetes mellitus with diabetic neuropathy, unspecified: Secondary | ICD-10-CM | POA: Diagnosis not present

## 2023-06-14 DIAGNOSIS — I10 Essential (primary) hypertension: Secondary | ICD-10-CM | POA: Diagnosis not present

## 2023-06-14 DIAGNOSIS — I959 Hypotension, unspecified: Secondary | ICD-10-CM | POA: Diagnosis not present

## 2023-06-14 DIAGNOSIS — K219 Gastro-esophageal reflux disease without esophagitis: Secondary | ICD-10-CM | POA: Diagnosis not present

## 2023-06-14 DIAGNOSIS — Z556 Problems related to health literacy: Secondary | ICD-10-CM | POA: Diagnosis not present

## 2023-06-14 NOTE — Telephone Encounter (Signed)
  Chief Complaint: medication question Symptoms: fatigue, dizziness when standing, nausea and vomiting at night Frequency: fatigue and dizziness when standing since Friday, nausea and vomiting at night intermittent Pertinent Negatives: Patient denies SOB, chest pain, headaches,  Disposition: [] ED /[] Urgent Care (no appt availability in office) / [] Appointment(In office/virtual)/ []  [facility]/ [] Home Care/ [] Refused Recommended Disposition /[] [facility]/ [x]  Follow-up with PCP Additional Notes: Wife calling in to ask about patient's amlodpine. She states the home health nurse called in on Friday and spoke with someone regarding parameters to hold the medication. Patient has been complaining of fatigue and dizziness when standing up or changing positions since Friday. Wife was told to hold his amlodipine for BP 110/70 or less. She is concerned that they have held it the past 2 days. She reports patient has nausea and vomiting at night, about 1-3 hours after eating. He throws up 2-3 times and then goes to bed. She states it did not happen last night but it happens frequently. Confirmed with wife patient has an appointment at 1:15pm for his hospital follow up, she states she thought it was 3pm and would like to confirm with the office. Informed her on his chart it states 1:15pm and would be a 1pm check in .  Copied from CRM [id number]. Topic: Clinical - Medication Question >> Jun 14, 2023  8:24 AM Theodis Sato wrote: Reason for CRM: Patients wife calling to ask if the patient should still be taking his amlodipine as the home health nurse states his pulse has been low and stopped administering the medication, but the wife is concerned about stopping the medication cold [place]. Please call and advise her at [phone]  *Patients wife states she has not gotten a pulse reading from him this morning as patient is still sleeping* Reason for Disposition  [1] Caller has URGENT medicine  question about med that PCP or specialist prescribed AND [2] triager unable to answer question  Answer Assessment - Initial Assessment Questions 1. NAME of MEDICINE: "What medicine(s) are you calling about?"     Amlodipine.  2. QUESTION: "What is your question?" (e.g., double dose of medicine, side effect)     Wife is calling to inform us they have held his amlodipine for the past 2 days (Sunday and Monday) due to his BP readings were 110/170 and 111/70.Wife is concerned about the medication being held and states she red "it's not good to cut the medication cold [place]". She states the home health nurse called in last week and was given parameters to hold the BP med for 110/70 or less.   3. PRESCRIBER: "Who prescribed the medicine?" Reason: if prescribed by specialist, call should be referred to that group.     Dr Faith Rogue (rehab), and she states on the bottle it says Dr Hollie Beach name.  4. SYMPTOMS: "Do you have any symptoms?" If Yes, ask: "What symptoms are you having?"  "How bad are the symptoms (e.g., mild, moderate, severe)     She states on Friday he was fatigued and dizziness when standing up . She states he has been complaining of nausea at night.  Protocols used: Medication Question Call-A-AH

## 2023-06-15 ENCOUNTER — Other Ambulatory Visit (HOSPITAL_BASED_OUTPATIENT_CLINIC_OR_DEPARTMENT_OTHER): Payer: Self-pay

## 2023-06-15 ENCOUNTER — Ambulatory Visit: Payer: Medicare Other | Admitting: Family Medicine

## 2023-06-15 ENCOUNTER — Encounter: Payer: Self-pay | Admitting: Family Medicine

## 2023-06-15 VITALS — BP 100/54 | HR 91 | Temp 97.6°F | Resp 20 | Ht 72.0 in | Wt 216.2 lb

## 2023-06-15 DIAGNOSIS — I10 Essential (primary) hypertension: Secondary | ICD-10-CM | POA: Diagnosis not present

## 2023-06-15 DIAGNOSIS — F4321 Adjustment disorder with depressed mood: Secondary | ICD-10-CM | POA: Insufficient documentation

## 2023-06-15 DIAGNOSIS — E1165 Type 2 diabetes mellitus with hyperglycemia: Secondary | ICD-10-CM | POA: Diagnosis not present

## 2023-06-15 DIAGNOSIS — Z794 Long term (current) use of insulin: Secondary | ICD-10-CM

## 2023-06-15 MED ORDER — EMPAGLIFLOZIN 25 MG PO TABS
25.0000 mg | ORAL_TABLET | Freq: Every day | ORAL | 2 refills | Status: DC
  Filled 2023-06-15: qty 30, 30d supply, fill #0
  Filled 2023-06-30 – 2023-07-08 (×2): qty 30, 30d supply, fill #1
  Filled 2023-07-25 – 2023-08-08 (×3): qty 30, 30d supply, fill #2

## 2023-06-15 MED ORDER — ESCITALOPRAM OXALATE 5 MG PO TABS
5.0000 mg | ORAL_TABLET | Freq: Every day | ORAL | 2 refills | Status: DC
  Filled 2023-06-15: qty 30, 30d supply, fill #0

## 2023-06-15 MED ORDER — FREESTYLE LIBRE 3 SENSOR MISC
1.0000 | Freq: Every day | 4 refills | Status: DC
  Filled 2023-06-15: qty 2, 28d supply, fill #0

## 2023-06-15 MED ORDER — AMLODIPINE BESYLATE 2.5 MG PO TABS: 2.5000 mg | ORAL_TABLET | Freq: Every day | ORAL | 2 refills | Status: DC

## 2023-06-15 MED ORDER — AMLODIPINE BESYLATE 2.5 MG PO TABS
2.5000 mg | ORAL_TABLET | Freq: Every day | ORAL | 2 refills | Status: DC
  Filled 2023-06-15 – 2023-06-16 (×2): qty 30, 30d supply, fill #0

## 2023-06-15 NOTE — Patient Instructions (Signed)
Keep the diet clean and stay active.  Please consider counseling. Contact [phone] to schedule an appointment or inquire about cost/insurance coverage.  [facility] located at [address], [place], [place].  Phone number = [phone].  Dr. Onoriode Edeh - Adult Psychiatry.    [facility] located at [address], [place], [place]. Phone number = [phone].   [facility] located at [address], [place], [place].  Phone number = [phone].   [facility] located at [address], [place], [place].  Phone number = [phone].   Let us know if you need anything.  

## 2023-06-15 NOTE — Progress Notes (Signed)
 Chief Complaint  Patient presents with   Hospitalization Follow-up    Patient presents today for a hospital f/u and medication review.    Subjective: Patient is a 78 y.o. male here for hosp and SNF f/u.  He is here with his wife.  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  He was admitted to the hospital for several weeks.  He went home and ended up falling in the shower.  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  Since coming home from SNF, he is working with home physical therapy.  He is not moving around very much.  Mood seems to be down and he has no appetite.  He has lost around 25 pounds.  Blood pressure and sugars have been coming down.  His wife has not been giving him his Toujeo and fasting sugars are in the low 100s.  He is compliant with his metformin.  He has been taking amlodipine 5 mg daily as needed depending on his blood pressure.  Levels will sometimes rise to the low 140s systolic.  Sometimes it will drop to less than 100.  He will still get lightheadedness/dizziness when he stands up too quickly.  Past Medical History:  Diagnosis Date   BPH (benign prostatic hyperplasia)    CTS (carpal tunnel syndrome)    Depression    Diabetes mellitus    Essential hypertension    GERD (gastroesophageal reflux disease)    History of breast cancer    Hypercholesteremia    Neuropathy     Objective: BP (!) 100/54   Pulse 91   Temp 97.6 F (36.4 C)   Resp 20   Ht 6' (1.829 m)   Wt 216 lb 3.2 oz (98.1 kg)   SpO2 97%   BMI 29.32 kg/m  General: Awake, appears stated age Heart: RRR, 2+ pitting bilateral LE edema tapering at the knees Lungs: CTAB, no rales, wheezes or rhonchi. No accessory muscle use Neuro: No cerebellar signs, gait is cautious and slow Psych: Age appropriate judgment and insight, normal affect and mood  Assessment and Plan: Type 2 diabetes mellitus with hyperglycemia, with long-term current use of insulin  (HCC)  Essential hypertension  Situational depression - Plan: escitalopram (LEXAPRO) 5 MG tablet  With his recent weight loss, we will stop his insulin.  Fasting sugars seem reasonable despite no basal insulin.  He will continue metformin XR 1000 mg twice daily.  Add Jardiance 25 mg daily.  He will let me know if cost is an issue.  Continue to monitor sugars. We will decrease his amlodipine from 5 mg daily as needed to 2.5 mg daily.  Continue to monitor blood pressure at home.  Counseled on diet and exercise. Counseling information provided.  Start Lexapro 5 mg daily.  He is also on bupropion 75 mg twice daily.  I would like to see him in 6 weeks to recheck the above. We also reviewed all of his current medications in addition to his hospitalization. The patient and his spouse voiced understanding and agreement to the plan.  I spent 42 minutes with the patient and his wife discussing the above plans in addition to reviewing his chart on the same day of the visit.  Jilda Roche [place], DO 06/15/23  2:10 PM

## 2023-06-16 ENCOUNTER — Other Ambulatory Visit (HOSPITAL_BASED_OUTPATIENT_CLINIC_OR_DEPARTMENT_OTHER): Payer: Self-pay

## 2023-06-16 ENCOUNTER — Other Ambulatory Visit: Payer: Self-pay | Admitting: Family Medicine

## 2023-06-16 DIAGNOSIS — I952 Hypotension due to drugs: Secondary | ICD-10-CM | POA: Diagnosis not present

## 2023-06-16 DIAGNOSIS — Z5181 Encounter for therapeutic drug level monitoring: Secondary | ICD-10-CM | POA: Diagnosis not present

## 2023-06-16 DIAGNOSIS — Z79810 Long term (current) use of selective estrogen receptor modulators (SERMs): Secondary | ICD-10-CM | POA: Diagnosis not present

## 2023-06-16 DIAGNOSIS — I2699 Other pulmonary embolism without acute cor pulmonale: Secondary | ICD-10-CM | POA: Insufficient documentation

## 2023-06-16 DIAGNOSIS — I959 Hypotension, unspecified: Secondary | ICD-10-CM | POA: Diagnosis not present

## 2023-06-16 DIAGNOSIS — Z7189 Other specified counseling: Secondary | ICD-10-CM | POA: Diagnosis not present

## 2023-06-16 DIAGNOSIS — Z5111 Encounter for antineoplastic chemotherapy: Secondary | ICD-10-CM | POA: Diagnosis not present

## 2023-06-16 DIAGNOSIS — C50921 Malignant neoplasm of unspecified site of right male breast: Secondary | ICD-10-CM | POA: Diagnosis not present

## 2023-06-16 MED ORDER — ONDANSETRON 4 MG PO TBDP
4.0000 mg | ORAL_TABLET | Freq: Three times a day (TID) | ORAL | 0 refills | Status: DC | PRN
  Filled 2023-06-16: qty 20, 7d supply, fill #0

## 2023-06-17 DIAGNOSIS — F32A Depression, unspecified: Secondary | ICD-10-CM | POA: Diagnosis not present

## 2023-06-17 DIAGNOSIS — E1140 Type 2 diabetes mellitus with diabetic neuropathy, unspecified: Secondary | ICD-10-CM | POA: Diagnosis not present

## 2023-06-17 DIAGNOSIS — Z794 Long term (current) use of insulin: Secondary | ICD-10-CM | POA: Diagnosis not present

## 2023-06-17 DIAGNOSIS — Z950 Presence of cardiac pacemaker: Secondary | ICD-10-CM | POA: Diagnosis not present

## 2023-06-17 DIAGNOSIS — I959 Hypotension, unspecified: Secondary | ICD-10-CM | POA: Diagnosis not present

## 2023-06-17 DIAGNOSIS — K219 Gastro-esophageal reflux disease without esophagitis: Secondary | ICD-10-CM | POA: Diagnosis not present

## 2023-06-17 DIAGNOSIS — Z556 Problems related to health literacy: Secondary | ICD-10-CM | POA: Diagnosis not present

## 2023-06-17 DIAGNOSIS — N400 Enlarged prostate without lower urinary tract symptoms: Secondary | ICD-10-CM | POA: Diagnosis not present

## 2023-06-17 DIAGNOSIS — Z9181 History of falling: Secondary | ICD-10-CM | POA: Diagnosis not present

## 2023-06-17 DIAGNOSIS — G5600 Carpal tunnel syndrome, unspecified upper limb: Secondary | ICD-10-CM | POA: Diagnosis not present

## 2023-06-17 DIAGNOSIS — E7800 Pure hypercholesterolemia, unspecified: Secondary | ICD-10-CM | POA: Diagnosis not present

## 2023-06-17 DIAGNOSIS — Z8673 Personal history of transient ischemic attack (TIA), and cerebral infarction without residual deficits: Secondary | ICD-10-CM | POA: Diagnosis not present

## 2023-06-17 DIAGNOSIS — E46 Unspecified protein-calorie malnutrition: Secondary | ICD-10-CM | POA: Diagnosis not present

## 2023-06-17 DIAGNOSIS — Z7984 Long term (current) use of oral hypoglycemic drugs: Secondary | ICD-10-CM | POA: Diagnosis not present

## 2023-06-17 DIAGNOSIS — I10 Essential (primary) hypertension: Secondary | ICD-10-CM | POA: Diagnosis not present

## 2023-06-17 DIAGNOSIS — Z7901 Long term (current) use of anticoagulants: Secondary | ICD-10-CM | POA: Diagnosis not present

## 2023-06-21 ENCOUNTER — Inpatient Hospital Stay: Payer: Medicare Other | Admitting: Adult Health

## 2023-06-21 ENCOUNTER — Other Ambulatory Visit (HOSPITAL_BASED_OUTPATIENT_CLINIC_OR_DEPARTMENT_OTHER): Payer: Self-pay

## 2023-06-22 DIAGNOSIS — Z794 Long term (current) use of insulin: Secondary | ICD-10-CM | POA: Diagnosis not present

## 2023-06-22 DIAGNOSIS — G5600 Carpal tunnel syndrome, unspecified upper limb: Secondary | ICD-10-CM | POA: Diagnosis not present

## 2023-06-22 DIAGNOSIS — I10 Essential (primary) hypertension: Secondary | ICD-10-CM | POA: Diagnosis not present

## 2023-06-22 DIAGNOSIS — N400 Enlarged prostate without lower urinary tract symptoms: Secondary | ICD-10-CM | POA: Diagnosis not present

## 2023-06-22 DIAGNOSIS — E7800 Pure hypercholesterolemia, unspecified: Secondary | ICD-10-CM | POA: Diagnosis not present

## 2023-06-22 DIAGNOSIS — F32A Depression, unspecified: Secondary | ICD-10-CM | POA: Diagnosis not present

## 2023-06-22 DIAGNOSIS — Z950 Presence of cardiac pacemaker: Secondary | ICD-10-CM | POA: Diagnosis not present

## 2023-06-22 DIAGNOSIS — Z7984 Long term (current) use of oral hypoglycemic drugs: Secondary | ICD-10-CM | POA: Diagnosis not present

## 2023-06-22 DIAGNOSIS — Z556 Problems related to health literacy: Secondary | ICD-10-CM | POA: Diagnosis not present

## 2023-06-22 DIAGNOSIS — K219 Gastro-esophageal reflux disease without esophagitis: Secondary | ICD-10-CM | POA: Diagnosis not present

## 2023-06-22 DIAGNOSIS — Z8673 Personal history of transient ischemic attack (TIA), and cerebral infarction without residual deficits: Secondary | ICD-10-CM | POA: Diagnosis not present

## 2023-06-22 DIAGNOSIS — Z7901 Long term (current) use of anticoagulants: Secondary | ICD-10-CM | POA: Diagnosis not present

## 2023-06-22 DIAGNOSIS — E46 Unspecified protein-calorie malnutrition: Secondary | ICD-10-CM | POA: Diagnosis not present

## 2023-06-22 DIAGNOSIS — Z9181 History of falling: Secondary | ICD-10-CM | POA: Diagnosis not present

## 2023-06-22 DIAGNOSIS — I959 Hypotension, unspecified: Secondary | ICD-10-CM | POA: Diagnosis not present

## 2023-06-22 DIAGNOSIS — E1140 Type 2 diabetes mellitus with diabetic neuropathy, unspecified: Secondary | ICD-10-CM | POA: Diagnosis not present

## 2023-06-23 ENCOUNTER — Other Ambulatory Visit (HOSPITAL_BASED_OUTPATIENT_CLINIC_OR_DEPARTMENT_OTHER): Payer: Self-pay

## 2023-06-23 ENCOUNTER — Encounter (HOSPITAL_BASED_OUTPATIENT_CLINIC_OR_DEPARTMENT_OTHER): Payer: Self-pay

## 2023-06-24 ENCOUNTER — Other Ambulatory Visit (HOSPITAL_BASED_OUTPATIENT_CLINIC_OR_DEPARTMENT_OTHER): Payer: Self-pay

## 2023-06-24 ENCOUNTER — Other Ambulatory Visit: Payer: Self-pay | Admitting: Family Medicine

## 2023-06-24 ENCOUNTER — Other Ambulatory Visit (HOSPITAL_COMMUNITY): Payer: Self-pay

## 2023-06-24 ENCOUNTER — Ambulatory Visit: Payer: Medicare Other | Admitting: Podiatry

## 2023-06-24 ENCOUNTER — Telehealth: Payer: Self-pay | Admitting: Family Medicine

## 2023-06-24 MED ORDER — PANTOPRAZOLE SODIUM 40 MG PO TBEC: 40.0000 mg | DELAYED_RELEASE_TABLET | Freq: Every day | ORAL | 1 refills | Status: DC

## 2023-06-24 MED ORDER — APIXABAN 5 MG PO TABS
5.0000 mg | ORAL_TABLET | Freq: Two times a day (BID) | ORAL | 1 refills | Status: DC
  Filled 2023-06-24: qty 180, 90d supply, fill #0
  Filled 2023-06-24: qty 60, 30d supply, fill #0
  Filled 2023-07-25: qty 60, 30d supply, fill #1

## 2023-06-24 MED ORDER — BUPROPION HCL 75 MG PO TABS: 75.0000 mg | ORAL_TABLET | Freq: Two times a day (BID) | ORAL | 1 refills | Status: DC

## 2023-06-24 MED ORDER — PANTOPRAZOLE SODIUM 40 MG PO TBEC
40.0000 mg | DELAYED_RELEASE_TABLET | Freq: Every day | ORAL | 0 refills | Status: DC
  Filled 2023-06-24: qty 30, 30d supply, fill #0

## 2023-06-24 MED ORDER — ATORVASTATIN CALCIUM 80 MG PO TABS: 80.0000 mg | ORAL_TABLET | Freq: Every day | ORAL | 3 refills | Status: DC

## 2023-06-24 MED ORDER — ATORVASTATIN CALCIUM 80 MG PO TABS
80.0000 mg | ORAL_TABLET | Freq: Every day | ORAL | 3 refills | Status: DC
  Filled 2023-06-24: qty 30, 30d supply, fill #0
  Filled 2023-07-06 – 2023-07-25 (×2): qty 30, 30d supply, fill #1

## 2023-06-24 MED ORDER — PANTOPRAZOLE SODIUM 40 MG PO TBEC
40.0000 mg | DELAYED_RELEASE_TABLET | Freq: Every day | ORAL | 1 refills | Status: DC
  Filled 2023-06-24: qty 90, 90d supply, fill #0
  Filled 2023-07-25: qty 90, 90d supply, fill #1

## 2023-06-24 MED ORDER — APIXABAN 5 MG PO TABS
5.0000 mg | ORAL_TABLET | Freq: Two times a day (BID) | ORAL | 1 refills | Status: DC
  Filled 2023-06-24: qty 180, 90d supply, fill #0

## 2023-06-24 MED ORDER — ATORVASTATIN CALCIUM 80 MG PO TABS
80.0000 mg | ORAL_TABLET | Freq: Every day | ORAL | 0 refills | Status: DC
  Filled 2023-06-24: qty 30, 30d supply, fill #0

## 2023-06-24 MED ORDER — BUPROPION HCL 75 MG PO TABS
75.0000 mg | ORAL_TABLET | Freq: Two times a day (BID) | ORAL | 0 refills | Status: DC
  Filled 2023-06-24: qty 60, 30d supply, fill #0

## 2023-06-24 MED ORDER — BUPROPION HCL 75 MG PO TABS
75.0000 mg | ORAL_TABLET | Freq: Two times a day (BID) | ORAL | 1 refills | Status: DC
  Filled 2023-06-24: qty 60, 30d supply, fill #0
  Filled 2023-07-25: qty 60, 30d supply, fill #1

## 2023-06-24 NOTE — Telephone Encounter (Signed)
 Turns out Lyrica was actually supposed to be Eliquis which was already sent. I sent the rest of the meds listed.

## 2023-06-24 NOTE — Addendum Note (Signed)
 Addended by: Maximino Sarin on: 06/24/2023 04:03 PM   Modules accepted: Orders

## 2023-06-24 NOTE — Telephone Encounter (Signed)
 Pt's wife came in and stated pt is due for refills of medication he got at the hospital and that pcp would be taking these over. Stated this was discussed at his appt 3/5.   atorvastatin (LIPITOR) 80 MG tablet  pantoprazole (PROTONIX) 40 MG tablet  buPROPion (WELLBUTRIN) 75 MG tablet  Lyrica 5mg   She would like of of these to be available at the pharmacy downstairs.

## 2023-06-24 NOTE — Telephone Encounter (Signed)
 All medications loaded except for Lyrica

## 2023-06-24 NOTE — Addendum Note (Signed)
 Addended by: Radene Gunning on: 06/24/2023 05:06 PM   Modules accepted: Orders

## 2023-06-28 ENCOUNTER — Other Ambulatory Visit (HOSPITAL_BASED_OUTPATIENT_CLINIC_OR_DEPARTMENT_OTHER): Payer: Self-pay

## 2023-06-28 ENCOUNTER — Other Ambulatory Visit (HOSPITAL_COMMUNITY): Payer: Self-pay

## 2023-06-29 ENCOUNTER — Other Ambulatory Visit (HOSPITAL_BASED_OUTPATIENT_CLINIC_OR_DEPARTMENT_OTHER): Payer: Self-pay

## 2023-06-29 ENCOUNTER — Other Ambulatory Visit: Payer: Self-pay

## 2023-06-29 MED ORDER — MOUNJARO 2.5 MG/0.5ML ~~LOC~~ SOAJ
2.5000 mg | SUBCUTANEOUS | 0 refills | Status: DC
  Filled 2023-06-29: qty 2, 28d supply, fill #0

## 2023-06-29 MED ORDER — AMLODIPINE BESYLATE 2.5 MG PO TABS
2.5000 mg | ORAL_TABLET | Freq: Every day | ORAL | 2 refills | Status: DC
  Filled 2023-06-29: qty 30, 30d supply, fill #0

## 2023-06-29 MED ORDER — PANTOPRAZOLE SODIUM 40 MG PO TBEC: 40.0000 mg | DELAYED_RELEASE_TABLET | Freq: Every day | ORAL | 1 refills | Status: DC

## 2023-06-29 MED ORDER — ATORVASTATIN CALCIUM 80 MG PO TABS
80.0000 mg | ORAL_TABLET | Freq: Every day | ORAL | 3 refills | Status: DC
  Filled 2023-06-29: qty 90, 90d supply, fill #0

## 2023-06-29 MED ORDER — MOUNJARO 5 MG/0.5ML ~~LOC~~ SOAJ
5.0000 mg | SUBCUTANEOUS | 0 refills | Status: DC
  Filled 2023-06-29: qty 2, 28d supply, fill #0

## 2023-06-29 MED ORDER — BUPROPION HCL 75 MG PO TABS
75.0000 mg | ORAL_TABLET | Freq: Two times a day (BID) | ORAL | 1 refills | Status: DC
  Filled 2023-06-29: qty 180, 90d supply, fill #0

## 2023-06-30 ENCOUNTER — Other Ambulatory Visit (HOSPITAL_BASED_OUTPATIENT_CLINIC_OR_DEPARTMENT_OTHER): Payer: Self-pay

## 2023-06-30 ENCOUNTER — Ambulatory Visit: Payer: Medicare Other

## 2023-06-30 DIAGNOSIS — I442 Atrioventricular block, complete: Secondary | ICD-10-CM | POA: Diagnosis not present

## 2023-07-01 DIAGNOSIS — K219 Gastro-esophageal reflux disease without esophagitis: Secondary | ICD-10-CM | POA: Diagnosis not present

## 2023-07-01 DIAGNOSIS — I959 Hypotension, unspecified: Secondary | ICD-10-CM | POA: Diagnosis not present

## 2023-07-01 DIAGNOSIS — Z7901 Long term (current) use of anticoagulants: Secondary | ICD-10-CM | POA: Diagnosis not present

## 2023-07-01 DIAGNOSIS — Z7984 Long term (current) use of oral hypoglycemic drugs: Secondary | ICD-10-CM | POA: Diagnosis not present

## 2023-07-01 DIAGNOSIS — Z794 Long term (current) use of insulin: Secondary | ICD-10-CM | POA: Diagnosis not present

## 2023-07-01 DIAGNOSIS — Z950 Presence of cardiac pacemaker: Secondary | ICD-10-CM | POA: Diagnosis not present

## 2023-07-01 DIAGNOSIS — Z9181 History of falling: Secondary | ICD-10-CM | POA: Diagnosis not present

## 2023-07-01 DIAGNOSIS — N400 Enlarged prostate without lower urinary tract symptoms: Secondary | ICD-10-CM | POA: Diagnosis not present

## 2023-07-01 DIAGNOSIS — G5600 Carpal tunnel syndrome, unspecified upper limb: Secondary | ICD-10-CM | POA: Diagnosis not present

## 2023-07-01 DIAGNOSIS — I10 Essential (primary) hypertension: Secondary | ICD-10-CM | POA: Diagnosis not present

## 2023-07-01 DIAGNOSIS — Z8673 Personal history of transient ischemic attack (TIA), and cerebral infarction without residual deficits: Secondary | ICD-10-CM | POA: Diagnosis not present

## 2023-07-01 DIAGNOSIS — E1140 Type 2 diabetes mellitus with diabetic neuropathy, unspecified: Secondary | ICD-10-CM | POA: Diagnosis not present

## 2023-07-01 DIAGNOSIS — E7800 Pure hypercholesterolemia, unspecified: Secondary | ICD-10-CM | POA: Diagnosis not present

## 2023-07-01 DIAGNOSIS — Z556 Problems related to health literacy: Secondary | ICD-10-CM | POA: Diagnosis not present

## 2023-07-01 DIAGNOSIS — E46 Unspecified protein-calorie malnutrition: Secondary | ICD-10-CM | POA: Diagnosis not present

## 2023-07-01 DIAGNOSIS — F32A Depression, unspecified: Secondary | ICD-10-CM | POA: Diagnosis not present

## 2023-07-01 LAB — CUP PACEART REMOTE DEVICE CHECK
Battery Remaining Longevity: 130 mo
Battery Remaining Percentage: 95.5 %
Battery Voltage: 3.02 V
Brady Statistic AP VP Percent: 1 %
Brady Statistic AP VS Percent: 1 %
Brady Statistic AS VP Percent: 1 %
Brady Statistic AS VS Percent: 99 %
Brady Statistic RA Percent Paced: 1 %
Brady Statistic RV Percent Paced: 1 %
Date Time Interrogation Session: 20250320022536
Implantable Lead Connection Status: 753985
Implantable Lead Connection Status: 753985
Implantable Lead Implant Date: 20250131
Implantable Lead Implant Date: 20250131
Implantable Lead Location: 753859
Implantable Lead Location: 753860
Implantable Pulse Generator Implant Date: 20250131
Lead Channel Impedance Value: 440 Ohm
Lead Channel Impedance Value: 460 Ohm
Lead Channel Pacing Threshold Amplitude: 0.75 V
Lead Channel Pacing Threshold Amplitude: 0.75 V
Lead Channel Pacing Threshold Pulse Width: 0.5 ms
Lead Channel Pacing Threshold Pulse Width: 0.5 ms
Lead Channel Sensing Intrinsic Amplitude: 2.7 mV
Lead Channel Sensing Intrinsic Amplitude: 4.3 mV
Lead Channel Setting Pacing Amplitude: 1 V
Lead Channel Setting Pacing Amplitude: 1.75 V
Lead Channel Setting Pacing Pulse Width: 0.5 ms
Lead Channel Setting Sensing Sensitivity: 2 mV
Pulse Gen Model: 2272
Pulse Gen Serial Number: 8241762

## 2023-07-06 ENCOUNTER — Telehealth: Payer: Self-pay | Admitting: Cardiology

## 2023-07-06 ENCOUNTER — Other Ambulatory Visit (HOSPITAL_BASED_OUTPATIENT_CLINIC_OR_DEPARTMENT_OTHER): Payer: Self-pay

## 2023-07-06 NOTE — Telephone Encounter (Signed)
 Left message to call back. Will forward to MD and his nurse.

## 2023-07-06 NOTE — Telephone Encounter (Signed)
 Pt c/o BP issue: STAT if pt c/o blurred vision, one-sided weakness or slurred speech  1. What are your last 5 BP readings? she says that yesterday am, his bp was 115/65. Last night it was 103/58, later that night in lt arm 99/58, rt arm is 104/63   2. Are you having any other symptoms (ex. Dizziness, headache, blurred vision, passed out)? Has fallen - 3/23   3. What is your BP issue? Patients wife states that he hasn't been being given the tamsulosin - she states his BP has been running low. she says that yesterday am, his bp was 115/65. Last night it was 103/58, later that night in lt arm 99/58, rt arm is 104/63 - feels like Dr. Jimmey Ralph needed to know to figure out what's going wrong

## 2023-07-06 NOTE — Progress Notes (Deleted)
 [facility] [address] [place]. [place] [address] [phone]       HOSPITAL FOLLOW UP NOTE  Mr. Mauricio Dahlen Date of Birth:  1945/10/05 Medical Record Number:  [id number]   Reason for Referral:  hospital stroke follow up    SUBJECTIVE:   CHIEF COMPLAINT:  No chief complaint on file.   HPI:   Mr. Jaquane Boughner is a 78 y.o. male with history of BPH, depression, diabetes, vertigo, hypertension, GERD, breast cancer status post chemotherapy and surgery, hyperlipidemia and neuropathy who presented to ED on 04/16/2023 with severe nausea and vertigo preventing ambulation.  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  EF ***.  LDL 78, A1c 12.4.  Recommended DAPT for 3 weeks then Plavix alone and continue atorvastatin 40 mg daily (previously noncompliant).  Discussed close follow-up with PCP for better DM control.  Therapies recommended CIR.  Discharge from CIR on 1/25 and return to ED the following day after syncopal episode with workup revealing occlusive and nonocclusive PE and acute DVT in left common femoral vein and SF junction and age-indeterminate VTE and external iliac vein.  Eliquis initiated and Plavix discontinued.  Felt syncope in setting of orthostatic hypotension.  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  He was recommended to discontinue tamoxifen for breast cancer due to blood clot and to follow-up with oncology.  Resumed amlodipine and discontinued furosemide and potassium.  Therapies recommended SNF.        PERTINENT IMAGING  CT head No acute abnormality. Small vessel disease.  CTA head & neck no LVO, 60% stenosis of proximal left ICA, 50% stenosis of origin of left  vertebral artery, 30% stenosis of bilateral carotid artery siphon regions, moderate long segment stenosis of proximal right PCA MRI small acute infarct in right middle cerebellar peduncle 2D Echo pending LDL 78 (04/11/23) HgbA1c 12.4 (04/11/23)    ROS:   14 system review of systems performed and negative with exception of ***  PMH:  Past Medical History:  Diagnosis Date   BPH (benign prostatic hyperplasia)    CTS (carpal tunnel syndrome)    Depression    Diabetes mellitus    Essential hypertension    GERD (gastroesophageal reflux disease)    History of breast cancer    Hypercholesteremia    Neuropathy     PSH:  Past Surgical History:  Procedure Laterality Date   CATARACT EXTRACTION, BILATERAL  01/18/2017   LITHOTRIPSY     MASTECTOMY Right 05/02/2018   PACEMAKER IMPLANT N/A 05/13/2023   Procedure: PACEMAKER IMPLANT;  Surgeon: Nobie Putnam, MD;  Location: [facility] INVASIVE CV LAB;  Service: Cardiovascular;  Laterality: N/A;   TEMPORARY PACEMAKER N/A 05/11/2023   Procedure: TEMPORARY PACEMAKER;  Surgeon: [place], Peter M, MD;  Location: [facility] INVASIVE CV LAB;  Service: Cardiovascular;  Laterality: N/A;   TONSILLECTOMY AND ADENOIDECTOMY      Social History:  Social History   Socioeconomic History   Marital status: Married    Spouse name: Not on file   Number of children: Not on file   Years of education: Not on file   Highest education level: Bachelor's degree (e.g., BA, AB, BS)  Occupational History   Not on file  Tobacco Use   Smoking status: Never  Smokeless tobacco: Never  Vaping Use   Vaping status: Never Used  Substance and Sexual Activity   Alcohol use: No   Drug use: No   Sexual activity: Not Currently    Partners: Female  Other Topics Concern   Not on file  Social History Narrative   Not on file   Social Drivers of Health   Financial Resource Strain: Low Risk  (04/07/2023)   Overall Financial Resource Strain (CARDIA)    Difficulty of Paying Living  Expenses: Not hard at all  Food Insecurity: No Food Insecurity (05/10/2023)   Hunger Vital Sign    Worried About Running Out of Food in the Last Year: Never true    Ran Out of Food in the Last Year: Never true  Transportation Needs: No Transportation Needs (05/10/2023)   PRAPARE - Administrator, Civil Service (Medical): No    Lack of Transportation (Non-Medical): No  Physical Activity: Unknown (04/07/2023)   Exercise Vital Sign    Days of Exercise per Week: 0 days    Minutes of Exercise per Session: Not on file  Stress: No Stress Concern Present (04/07/2023)   [employer] of Occupational Health - Occupational Stress Questionnaire    Feeling of Stress : Not at all  Social Connections: Moderately Isolated (05/10/2023)   Social Connection and Isolation Panel [NHANES]    Frequency of Communication with Friends and Family: More than three times a week    Frequency of Social Gatherings with Friends and Family: More than three times a week    Attends Religious Services: Never    Database administrator or Organizations: No    Attends Banker Meetings: Never    Marital Status: Married  Catering manager Violence: Not At Risk (05/10/2023)   Humiliation, Afraid, Rape, and Kick questionnaire    Fear of Current or Ex-Partner: No    Emotionally Abused: No    Physically Abused: No    Sexually Abused: No    Family History:  Family History  Problem Relation Age of Onset   Lymphoma Mother    Cancer Mother    Diabetes Father    Stroke Father    Ovarian cancer Sister    Cancer Sister    Cancer Paternal Grandmother     Medications:   Current Outpatient Medications on File Prior to Visit  Medication Sig Dispense Refill   acetaminophen (TYLENOL) 650 MG CR tablet Take 1 tablet (650 mg total) by mouth every 8 (eight) hours as needed for pain.     apixaban (ELIQUIS) 5 MG TABS tablet Take 1 tablet (5 mg total) by mouth 2 (two) times daily. 180 tablet 1   Ascorbic Acid  (VITAMIN C) 1000 MG tablet Take 1,000 mg by mouth 3 (three) times a week.     atorvastatin (LIPITOR) 80 MG tablet Take 1 tablet (80 mg total) by mouth at bedtime. 90 tablet 3   atorvastatin (LIPITOR) 80 MG tablet Take 1 tablet (80 mg total) by mouth at bedtime. 90 tablet 3   Blood Glucose Monitoring Suppl (ACCU-CHEK GUIDE) w/Device KIT Use daily to check blood sugar.  DX E11.69 1 kit 0   buPROPion (WELLBUTRIN) 75 MG tablet Take 1 tablet (75 mg total) by mouth 2 (two) times daily. 180 tablet 1   buPROPion (WELLBUTRIN) 75 MG tablet Take 1 tablet (75 mg total) by mouth 2 (two) times daily. 180 tablet 1   Continuous Glucose Sensor (FREESTYLE LIBRE 3 SENSOR) MISC Place 1 sensor  on the skin every 14 days. Use to check glucose continuously 2 each 4   empagliflozin (JARDIANCE) 25 MG TABS tablet Take 1 tablet (25 mg total) by mouth daily. 30 tablet 2   finasteride (PROSCAR) 5 MG tablet Take 1 tablet (5 mg total) by mouth daily. 30 tablet 0   Lancets Misc. (ACCU-CHEK SOFTCLIX LANCET DEV) KIT Use daily to check blood sugar.  DX E11.69     meclizine (ANTIVERT) 12.5 MG tablet Take 1 tablet (12.5 mg total) by mouth 3 (three) times daily as needed for dizziness. 30 tablet 0   metFORMIN (GLUCOPHAGE-XR) 500 MG 24 hr tablet Take 2 tablets (1,000 mg total) by mouth 2 (two) times daily with a meal. 120 tablet 0   Multiple Vitamin (MULTIVITAMIN) tablet Take 1 tablet by mouth 2 (two) times a week.     ondansetron (ZOFRAN-ODT) 4 MG disintegrating tablet Take 1 tablet (4 mg total) by mouth every 8 (eight) hours as needed for nausea or vomiting. 20 tablet 0   pantoprazole (PROTONIX) 40 MG tablet Take 1 tablet (40 mg total) by mouth daily. (Take in place of omeprazole while taking clopidogrel.) 90 tablet 1   pantoprazole (PROTONIX) 40 MG tablet Take 1 tablet (40 mg total) by mouth daily. Take in place of Omeprazole while taking Clopidogrel. 90 tablet 1   polyethylene glycol (MIRALAX / GLYCOLAX) 17 g packet Take 17 g by mouth  2 (two) times daily. 14 each 0   tamsulosin (FLOMAX) 0.4 MG CAPS capsule Take 1 capsule (0.4 mg total) by mouth daily. 30 capsule 0   Triamcinolone Acetonide (TRIAMCINOLONE 0.1 % CREAM : EUCERIN) CREA Apply 1 Application topically 2 (two) times daily. 1 each 0   [DISCONTINUED] amLODipine (NORVASC) 2.5 MG tablet Take 1 tablet (2.5 mg total) by mouth daily. 30 tablet 2   [DISCONTINUED] escitalopram (LEXAPRO) 5 MG tablet Take 1 tablet (5 mg total) by mouth daily. 30 tablet 2   No current facility-administered medications on file prior to visit.    Allergies:   Allergies  Allergen Reactions   Ramipril Anaphylaxis   Other Diarrhea    Severe intolerance to Chemotherapy in the past.   Sertraline Other (See Comments)    "Extreme headaches"   Adhesive [Tape] Rash      OBJECTIVE:  Physical Exam  There were no vitals filed for this visit. There is no height or weight on file to calculate BMI. No results found.   General: well developed, well nourished, seated, in no evident distress Head: head normocephalic and atraumatic.   Neck: supple with no carotid or supraclavicular bruits Cardiovascular: regular rate and rhythm, no murmurs Musculoskeletal: no deformity Skin:  no rash/petichiae Vascular:  Normal pulses all extremities   Neurologic Exam Mental Status: Awake and fully alert. Oriented to place and time. Recent and remote memory intact. Attention span, concentration and fund of knowledge appropriate. Mood and affect appropriate.  Cranial Nerves: Fundoscopic exam reveals sharp disc margins. Pupils equal, briskly reactive to light. Extraocular movements full without nystagmus. Visual fields full to confrontation. Hearing intact. Facial sensation intact. Face, tongue, palate moves normally and symmetrically.  Motor: Normal bulk and tone. Normal strength in all tested extremity muscles Sensory.: intact to touch , pinprick , position and vibratory sensation.  Coordination: Rapid  alternating movements normal in all extremities. Finger-to-nose and heel-to-shin performed accurately bilaterally. Gait and Station: Arises from chair without difficulty. Stance is normal. Gait demonstrates normal stride length and balance with ***. Tandem walk and heel toe ***.  Reflexes: 1+ and symmetric. Toes downgoing.     NIHSS  *** Modified Rankin  ***      ASSESSMENT: Ebubechukwu Jedlicka is a 78 y.o. 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 Vascular risk factors include HTN, HLD, DM, left carotid and left VA stenosis and advanced age.  He was found to have PE and acute DVT on 1/26 placed on Eliquis and underwent dual-chamber pacemaker implant for bradycardia.     PLAN:  Right cerebellar stroke:  Residual deficit: ***.  Continue Plavix and atorvastatin (Lipitor) for secondary stroke prevention managed/prescribed by PCP.   Discussed secondary stroke prevention measures and importance of close PCP follow up for aggressive stroke risk factor management including BP goal<130/90, HLD with LDL goal<70 and DM with A1c.<7 .  Stroke labs 03/2023: LDL 78, A1c 12.4 I have gone over the pathophysiology of stroke, warning signs and symptoms, risk factors and their management in some detail with instructions to go to the closest emergency room for symptoms of concern.     Follow up in *** or call earlier if needed   CC:  [facility] provider: Dr. Pearlean Brownie PCP: Sharlene Dory, DO    I spent *** minutes of face-to-face and non-face-to-face time with patient.  This included previsit chart review including review of recent hospitalization, lab review, study review, order entry, electronic health record documentation, patient education regarding recent stroke including etiology, secondary stroke prevention measures and importance of managing stroke risk factors, residual deficits and typical recovery time and answered all  other questions to patient satisfaction   Ihor Austin, AGNP-BC  [facility] [address] [place], [place] [address]  Phone [phone] Fax [phone] Note: This document was prepared with digital dictation and possible smart phrase technology. Any transcriptional errors that result from this process are unintentional.

## 2023-07-06 NOTE — Telephone Encounter (Signed)
 Pt returning call to nurse

## 2023-07-06 NOTE — Telephone Encounter (Signed)
 Spoke with wife and she is aware she will need to contact PCP regarding patient BP and tamsulosin

## 2023-07-07 ENCOUNTER — Inpatient Hospital Stay: Payer: Medicare Other | Admitting: Adult Health

## 2023-07-07 ENCOUNTER — Other Ambulatory Visit: Payer: Self-pay

## 2023-07-07 ENCOUNTER — Other Ambulatory Visit (HOSPITAL_BASED_OUTPATIENT_CLINIC_OR_DEPARTMENT_OTHER): Payer: Self-pay

## 2023-07-08 DIAGNOSIS — G5600 Carpal tunnel syndrome, unspecified upper limb: Secondary | ICD-10-CM | POA: Diagnosis not present

## 2023-07-08 DIAGNOSIS — Z950 Presence of cardiac pacemaker: Secondary | ICD-10-CM | POA: Diagnosis not present

## 2023-07-08 DIAGNOSIS — E46 Unspecified protein-calorie malnutrition: Secondary | ICD-10-CM | POA: Diagnosis not present

## 2023-07-08 DIAGNOSIS — K219 Gastro-esophageal reflux disease without esophagitis: Secondary | ICD-10-CM | POA: Diagnosis not present

## 2023-07-08 DIAGNOSIS — Z7984 Long term (current) use of oral hypoglycemic drugs: Secondary | ICD-10-CM | POA: Diagnosis not present

## 2023-07-08 DIAGNOSIS — F32A Depression, unspecified: Secondary | ICD-10-CM | POA: Diagnosis not present

## 2023-07-08 DIAGNOSIS — I959 Hypotension, unspecified: Secondary | ICD-10-CM | POA: Diagnosis not present

## 2023-07-08 DIAGNOSIS — E1140 Type 2 diabetes mellitus with diabetic neuropathy, unspecified: Secondary | ICD-10-CM | POA: Diagnosis not present

## 2023-07-08 DIAGNOSIS — N400 Enlarged prostate without lower urinary tract symptoms: Secondary | ICD-10-CM | POA: Diagnosis not present

## 2023-07-08 DIAGNOSIS — Z7901 Long term (current) use of anticoagulants: Secondary | ICD-10-CM | POA: Diagnosis not present

## 2023-07-08 DIAGNOSIS — I10 Essential (primary) hypertension: Secondary | ICD-10-CM | POA: Diagnosis not present

## 2023-07-08 DIAGNOSIS — E7800 Pure hypercholesterolemia, unspecified: Secondary | ICD-10-CM | POA: Diagnosis not present

## 2023-07-08 DIAGNOSIS — Z9181 History of falling: Secondary | ICD-10-CM | POA: Diagnosis not present

## 2023-07-08 DIAGNOSIS — Z794 Long term (current) use of insulin: Secondary | ICD-10-CM | POA: Diagnosis not present

## 2023-07-08 DIAGNOSIS — Z8673 Personal history of transient ischemic attack (TIA), and cerebral infarction without residual deficits: Secondary | ICD-10-CM | POA: Diagnosis not present

## 2023-07-08 DIAGNOSIS — Z556 Problems related to health literacy: Secondary | ICD-10-CM | POA: Diagnosis not present

## 2023-07-11 ENCOUNTER — Ambulatory Visit (INDEPENDENT_AMBULATORY_CARE_PROVIDER_SITE_OTHER): Admitting: Family Medicine

## 2023-07-11 ENCOUNTER — Other Ambulatory Visit (HOSPITAL_BASED_OUTPATIENT_CLINIC_OR_DEPARTMENT_OTHER): Payer: Self-pay

## 2023-07-11 ENCOUNTER — Encounter: Payer: Self-pay | Admitting: Family Medicine

## 2023-07-11 VITALS — BP 130/78 | HR 94 | Ht 72.0 in | Wt 206.0 lb

## 2023-07-11 DIAGNOSIS — R682 Dry mouth, unspecified: Secondary | ICD-10-CM | POA: Diagnosis not present

## 2023-07-11 DIAGNOSIS — R195 Other fecal abnormalities: Secondary | ICD-10-CM

## 2023-07-11 DIAGNOSIS — R634 Abnormal weight loss: Secondary | ICD-10-CM

## 2023-07-11 DIAGNOSIS — R112 Nausea with vomiting, unspecified: Secondary | ICD-10-CM | POA: Diagnosis not present

## 2023-07-11 MED ORDER — PROMETHAZINE HCL 25 MG PO TABS
12.5000 mg | ORAL_TABLET | Freq: Three times a day (TID) | ORAL | 0 refills | Status: DC | PRN
  Filled 2023-07-11: qty 20, 7d supply, fill #0

## 2023-07-11 MED ORDER — CLOTRIMAZOLE-BETAMETHASONE 1-0.05 % EX CREA
1.0000 | TOPICAL_CREAM | Freq: Every day | CUTANEOUS | 0 refills | Status: DC
  Filled 2023-07-11: qty 30, 30d supply, fill #0

## 2023-07-11 NOTE — Progress Notes (Signed)
 Chief Complaint  Patient presents with   Acute Visit    Patient presents for weight loss concerns and vomiting.    Subjective: Patient is a 78 y.o. male here for f/u.  He is here with his wife.  The patient was admitted for an acute stroke in the middle of January of this year.  He was discharged to SNF.  He was having a lot of vomiting and inability to keep down foods.  It does not seem to matter which type of food.  He has lost around 30 pounds since the start of this hospitalization.  Nothing sounds good to him to eat.  He has had loose stools also, but none with blood.  Zofran has not been helpful.  He has not tried anything else.  He has been taking pantoprazole which also has not been helpful.  He was changed from omeprazole.  Past Medical History:  Diagnosis Date   BPH (benign prostatic hyperplasia)    CTS (carpal tunnel syndrome)    Depression    Diabetes mellitus    Essential hypertension    GERD (gastroesophageal reflux disease)    History of breast cancer    Hypercholesteremia    Neuropathy     Objective: BP 130/78   Pulse 94   Ht 6' (1.829 m)   Wt 206 lb (93.4 kg)   SpO2 98%   BMI 27.94 kg/m  General: Awake, appears stated age Mouth: Mucous membranes dry Heart: RRR, no LE edema Lungs: CTAB, no rales, wheezes or rhonchi. No accessory muscle use Abdomen: Bowel sounds present, soft, nontender, mildly distended Psych: Age appropriate judgment and insight, normal affect and mood  Assessment and Plan: Unintentional weight loss of 10% body weight within 6 months - Plan: Ambulatory referral to Gastroenterology  Loose stools - Plan: Ambulatory referral to Gastroenterology, Comprehensive metabolic panel with GFR, CBC  Nausea and vomiting, unspecified vomiting type - Plan: Ambulatory referral to Gastroenterology, promethazine (PHENERGAN) 25 MG tablet  Dry mouth  This is now a chronic issue that is not currently controlled.  Will urgently refer to gastroenterology for  their opinion.  Check labs.  Add Phenergan 25 mg 3 times daily as needed.  Need to try to push fluids. The patient and his spouse voiced understanding and agreement to the plan.  Jilda Roche [place], DO 07/11/23  5:02 PM

## 2023-07-12 ENCOUNTER — Encounter: Payer: Self-pay | Admitting: Physician Assistant

## 2023-07-12 ENCOUNTER — Encounter: Payer: Self-pay | Admitting: Family Medicine

## 2023-07-12 LAB — COMPREHENSIVE METABOLIC PANEL WITH GFR
ALT: 20 U/L (ref 0–53)
AST: 22 U/L (ref 0–37)
Albumin: 3.9 g/dL (ref 3.5–5.2)
Alkaline Phosphatase: 57 U/L (ref 39–117)
BUN: 11 mg/dL (ref 6–23)
CO2: 24 meq/L (ref 19–32)
Calcium: 8.9 mg/dL (ref 8.4–10.5)
Chloride: 101 meq/L (ref 96–112)
Creatinine, Ser: 1.12 mg/dL (ref 0.40–1.50)
GFR: 63.52 mL/min (ref >60.00)
Glucose, Bld: 200 mg/dL — ABNORMAL HIGH (ref 70–99)
Potassium: 3.6 meq/L (ref 3.5–5.1)
Sodium: 140 meq/L (ref 135–145)
Total Bilirubin: 1.2 mg/dL (ref 0.2–1.2)
Total Protein: 6 g/dL (ref 6.0–8.3)

## 2023-07-12 LAB — CBC
HCT: 37.2 % — ABNORMAL LOW (ref 39.0–52.0)
Hemoglobin: 12.3 g/dL — ABNORMAL LOW (ref 13.0–17.0)
MCHC: 33 g/dL (ref 30.0–36.0)
MCV: 86.3 fl (ref 78.0–100.0)
Platelets: 283 10*3/uL (ref 150.0–400.0)
RBC: 4.31 Mil/uL (ref 4.22–5.81)
RDW: 21.6 % — ABNORMAL HIGH (ref 11.5–15.5)
WBC: 8.6 10*3/uL (ref 4.0–10.5)

## 2023-07-13 ENCOUNTER — Emergency Department (HOSPITAL_BASED_OUTPATIENT_CLINIC_OR_DEPARTMENT_OTHER)
Admission: EM | Admit: 2023-07-13 | Discharge: 2023-07-13 | Disposition: A | Discharge status: Home / Self Care | Attending: Emergency Medicine | Admitting: Emergency Medicine

## 2023-07-13 ENCOUNTER — Other Ambulatory Visit (HOSPITAL_BASED_OUTPATIENT_CLINIC_OR_DEPARTMENT_OTHER): Payer: Self-pay

## 2023-07-13 ENCOUNTER — Encounter (HOSPITAL_BASED_OUTPATIENT_CLINIC_OR_DEPARTMENT_OTHER): Payer: Self-pay

## 2023-07-13 ENCOUNTER — Ambulatory Visit

## 2023-07-13 DIAGNOSIS — R339 Retention of urine, unspecified: Secondary | ICD-10-CM | POA: Diagnosis not present

## 2023-07-13 DIAGNOSIS — E119 Type 2 diabetes mellitus without complications: Secondary | ICD-10-CM | POA: Diagnosis not present

## 2023-07-13 DIAGNOSIS — N390 Urinary tract infection, site not specified: Secondary | ICD-10-CM | POA: Diagnosis not present

## 2023-07-13 DIAGNOSIS — Z79899 Other long term (current) drug therapy: Secondary | ICD-10-CM | POA: Diagnosis not present

## 2023-07-13 DIAGNOSIS — Z7901 Long term (current) use of anticoagulants: Secondary | ICD-10-CM | POA: Insufficient documentation

## 2023-07-13 DIAGNOSIS — I10 Essential (primary) hypertension: Secondary | ICD-10-CM | POA: Insufficient documentation

## 2023-07-13 DIAGNOSIS — Z7984 Long term (current) use of oral hypoglycemic drugs: Secondary | ICD-10-CM | POA: Insufficient documentation

## 2023-07-13 LAB — URINALYSIS, W/ REFLEX TO CULTURE (INFECTION SUSPECTED)
Bilirubin Urine: NEGATIVE
Glucose, UA: >=500 mg/dL — AB
Ketones, ur: NEGATIVE mg/dL
Nitrite: NEGATIVE
Protein, ur: NEGATIVE mg/dL
Specific Gravity, Urine: <=1.005 (ref 1.005–1.030)
pH: 5.5 (ref 5.0–8.0)

## 2023-07-13 LAB — BASIC METABOLIC PANEL WITH GFR
Anion gap: 14 (ref 5–15)
BUN: 12 mg/dL (ref 8–23)
CO2: 25 mmol/L (ref 22–32)
Calcium: 8.9 mg/dL (ref 8.9–10.3)
Chloride: 100 mmol/L (ref 98–111)
Creatinine, Ser: 1.1 mg/dL (ref 0.61–1.24)
GFR, Estimated: >60 mL/min (ref >60)
Glucose, Bld: 152 mg/dL — ABNORMAL HIGH (ref 70–99)
Potassium: 3.6 mmol/L (ref 3.5–5.1)
Sodium: 139 mmol/L (ref 135–145)

## 2023-07-13 MED ORDER — CEPHALEXIN 500 MG PO CAPS
500.0000 mg | ORAL_CAPSULE | Freq: Two times a day (BID) | ORAL | 0 refills | Status: AC
  Filled 2023-07-13: qty 10, 5d supply, fill #0

## 2023-07-13 NOTE — Discharge Instructions (Addendum)
 Please keep your Foley catheter in place intake and follow-up with urology.  An antibiotic prescription for urinary tract infection has been provided.

## 2023-07-13 NOTE — ED Notes (Addendum)
 Pt alert and oriented X 4 at the time of discharge. RR even and unlabored. No acute distress noted. Pt and wife verbalized understanding of discharge instructions as discussed. Pt transported in wheelchair to lobby at time of discharge.

## 2023-07-13 NOTE — ED Triage Notes (Signed)
 BIB EMS from home c/o urinary retention x 2 days. Monday produced some urine but today feels like he has to go and nothing is coming out. Has some penile pain. Hx enlarged prostate

## 2023-07-13 NOTE — ED Notes (Signed)
 Standard foley leg bag changed to a leg bag for discharge. Pt and wife educated on care of this at home. Pt questions answered and they voiced understanding.

## 2023-07-13 NOTE — ED Provider Notes (Signed)
 [place] [facility] Provider Note   CSN: [id number] Arrival date & time: 07/13/23  2952     History  Chief Complaint  Patient presents with   Urinary Retention    Lynkin Saini is a 78 y.o. male.  HPI   78 year old male with medical history significant for diabetes mellitus, HLD, HTN, GERD, BPH presenting to the emergency department with a chief complaint of urinary retention.  The patient has had significant difficulty urinating for the past 2 days.  He produce some urine today but feels like he continually has to urinate and nothing is coming out.  He endorses some penile pain. He has abdominal pain suprapubically. No fevers or chills.    Home Medications Prior to Admission medications   Medication Sig Start Date End Date Taking? Authorizing Provider  cephALEXin (KEFLEX) 500 MG capsule Take 1 capsule (500 mg total) by mouth 2 (two) times daily for 5 days. 07/13/23 07/18/23 Yes Ernie Avena, MD  acetaminophen (TYLENOL) 650 MG CR tablet Take 1 tablet (650 mg total) by mouth every 8 (eight) hours as needed for pain. 05/14/23   Cyndi Bender, NP  apixaban (ELIQUIS) 5 MG TABS tablet Take 1 tablet (5 mg total) by mouth 2 (two) times daily. 06/24/23   Sharlene Dory, DO  Ascorbic Acid (VITAMIN C) 1000 MG tablet Take 1,000 mg by mouth 3 (three) times a week.    [provider]  atorvastatin (LIPITOR) 80 MG tablet Take 1 tablet (80 mg total) by mouth at bedtime. 06/24/23   Sharlene Dory, DO  Blood Glucose Monitoring Suppl (ACCU-CHEK GUIDE) w/Device KIT Use daily to check blood sugar.  DX E11.69 11/22/22   Sharlene Dory, DO  buPROPion (WELLBUTRIN) 75 MG tablet Take 1 tablet (75 mg total) by mouth 2 (two) times daily. 06/24/23   Sharlene Dory, DO  clotrimazole-betamethasone (LOTRISONE) cream Apply 1 Application topically daily. 07/11/23   Sharlene Dory, DO  Continuous Glucose Sensor (FREESTYLE LIBRE 3 SENSOR)  MISC Place 1 sensor on the skin every 14 days. Use to check glucose continuously 06/15/23   Sharlene Dory, DO  empagliflozin (JARDIANCE) 25 MG TABS tablet Take 1 tablet (25 mg total) by mouth daily. 06/15/23   Sharlene Dory, DO  finasteride (PROSCAR) 5 MG tablet Take 1 tablet (5 mg total) by mouth daily. 05/04/23 12/27/24  Angiulli, Mcarthur Rossetti, PA-C  Lancets Misc. (ACCU-CHEK SOFTCLIX LANCET DEV) KIT Use daily to check blood sugar.  DX E11.69    [provider]  metFORMIN (GLUCOPHAGE-XR) 500 MG 24 hr tablet Take 2 tablets (1,000 mg total) by mouth 2 (two) times daily with a meal. 05/04/23   Angiulli, Mcarthur Rossetti, PA-C  Multiple Vitamin (MULTIVITAMIN) tablet Take 1 tablet by mouth 2 (two) times a week.    [provider]  pantoprazole (PROTONIX) 40 MG tablet Take 1 tablet (40 mg total) by mouth daily. (Take in place of omeprazole while taking clopidogrel.) 06/24/23   Sharlene Dory, DO  polyethylene glycol (MIRALAX / GLYCOLAX) 17 g packet Take 17 g by mouth 2 (two) times daily. 04/22/23   Regalado, Belkys A, MD  promethazine (PHENERGAN) 25 MG tablet Take 0.5-1 tablets (12.5-25 mg total) by mouth every 8 (eight) hours as needed for nausea or vomiting. 07/11/23   Sharlene Dory, DO  tamsulosin (FLOMAX) 0.4 MG CAPS capsule Take 1 capsule (0.4 mg total) by mouth daily. 05/04/23 12/27/24  Angiulli, Mcarthur Rossetti, PA-C  Triamcinolone  Acetonide (TRIAMCINOLONE 0.1 % CREAM : EUCERIN) CREA Apply 1 Application topically 2 (two) times daily. 04/22/23   Regalado, Belkys A, MD  amLODipine (NORVASC) 2.5 MG tablet Take 1 tablet (2.5 mg total) by mouth daily. 06/15/23 06/30/23  Sharlene Dory, DO  escitalopram (LEXAPRO) 5 MG tablet Take 1 tablet (5 mg total) by mouth daily. 06/15/23 06/30/23  Sharlene Dory, DO      Allergies    Ramipril, Other, Sertraline, and Adhesive [tape]    Review of Systems   Review of Systems  All other systems reviewed and are  negative.   Physical Exam Updated Vital Signs BP (!) 146/86 (BP Location: Right Arm)   Pulse 93   Temp 98.4 F (36.9 C) (Oral)   Resp 18   Ht 6' (1.829 m)   Wt 93.4 kg   SpO2 97%   BMI 27.94 kg/m  Physical Exam Vitals and nursing note reviewed.  Constitutional:      General: He is in acute distress.     Comments: In acute pain  HENT:     Head: Normocephalic and atraumatic.  Eyes:     Conjunctiva/sclera: Conjunctivae normal.     Pupils: Pupils are equal, round, and reactive to light.  Cardiovascular:     Rate and Rhythm: Normal rate and regular rhythm.  Pulmonary:     Effort: Pulmonary effort is normal. No respiratory distress.  Abdominal:     General: There is no distension.     Tenderness: There is abdominal tenderness. There is no guarding.     Comments: Suprapubic tenderness to palpation  Musculoskeletal:        General: No deformity or signs of injury.     Cervical back: Neck supple.  Skin:    Findings: No lesion or rash.  Neurological:     General: No focal deficit present.     Mental Status: He is alert. Mental status is at baseline.     ED Results / Procedures / Treatments   Labs (all labs ordered are listed, but only abnormal results are displayed) Labs Reviewed  URINALYSIS, W/ REFLEX TO CULTURE (INFECTION SUSPECTED) - Abnormal; Notable for the following components:      Result Value   Glucose, UA >=500 (*)    Hgb urine dipstick TRACE (*)    Leukocytes,Ua TRACE (*)    Bacteria, UA RARE (*)    All other components within normal limits  BASIC METABOLIC PANEL WITH GFR - Abnormal; Notable for the following components:   Glucose, Bld 152 (*)    All other components within normal limits  URINE CULTURE    EKG None  Radiology No results found.  Procedures Procedures    Medications Ordered in ED Medications - No data to display  ED Course/ Medical Decision Making/ A&P                                 Medical Decision Making Amount and/or  Complexity of Data Reviewed Labs: ordered.  Risk Prescription drug management.     78 year old male with medical history significant for diabetes mellitus, HLD, HTN, GERD, BPH presenting to the emergency department with a chief complaint of urinary retention.  The patient has had significant difficulty urinating for the past 2 days.  He produce some urine today but feels like he continually has to urinate and nothing is coming out.  He endorses some penile pain. He has abdominal  pain suprapubically. No fevers or chills.    On arrival, the patient was vitally stable.  Exam revealed suprapubic tenderness to palpation.  Bladder scan initially revealed a bladder volume of 742 cc.  Concern for acute urinary retention, Foley catheter was ordered in addition to urinalysis/urine culture and BMP.  BMP without evidence of AKI, no significant electrolyte abnormality, mildly elevated blood glucose to 152.  Urinalysis revealed glucosuria, hematuria, trace leukocytes, 11-20 WBCs and rare bacteria.  Will go ahead and treat given the patient's presentation with a course of Keflex.  A Foley catheter was successfully placed with drainage of 1500 cc of urine into the Foley bag.  The patient was instructed by nursing regarding Foley catheter care and was advised to follow-up outpatient with urology.  Not septic, overall well-appearing, pain improved on repeat assessment.  Stable for discharge.   Final Clinical Impression(s) / ED Diagnoses Final diagnoses:  Urinary retention  Lower urinary tract infectious disease    Rx / DC Orders ED Discharge Orders          Ordered    cephALEXin (KEFLEX) 500 MG capsule  2 times daily        07/13/23 1202    Ambulatory referral to Urology        07/13/23 1202              Ernie Avena, MD 07/13/23 1203

## 2023-07-15 LAB — URINE CULTURE: Culture: >=100000 — AB

## 2023-07-16 ENCOUNTER — Telehealth (HOSPITAL_BASED_OUTPATIENT_CLINIC_OR_DEPARTMENT_OTHER): Payer: Self-pay | Admitting: *Deleted

## 2023-07-16 NOTE — Telephone Encounter (Signed)
 Post ED Visit - Positive Culture Follow-up  Culture report reviewed by antimicrobial stewardship pharmacist: [facility] []  Enzo Bi, Pharm.D. []  Celedonio Miyamoto, Pharm.D., BCPS AQ-ID []  Garvin Fila, Pharm.D., BCPS []  Georgina Pillion, [address].D., BCPS []  [place], [address].D., BCPS, AAHIVP []  Estella Husk, Pharm.D., BCPS, AAHIVP []  Lysle Pearl, PharmD, BCPS []  Phillips Climes, PharmD, BCPS []  Agapito Games, PharmD, BCPS []  Verlan Friends, PharmD []  Mervyn Gay, PharmD, BCPS [x]  ,Judeen Hammans PharmD  [facility] []  Len Childs, PharmD []  Greer Pickerel, PharmD []  Adalberto Cole, PharmD []  Perlie Jezewski, Rph []  Lonell Face) Jean Rosenthal, PharmD []  Earl Many, PharmD []  Junita Push, PharmD []  Dorna Leitz, PharmD []  Terrilee Files, PharmD []  Lynann Beaver, PharmD []  Keturah Barre, PharmD []  Loralee Pacas, PharmD []  Bernadene Person, PharmD   Positive urine culture Treated with Cephalexin, organism sensitive to the same and no further patient follow-up is required at this time.  John [name] 07/16/2023, 8:54 AM

## 2023-07-18 ENCOUNTER — Other Ambulatory Visit (HOSPITAL_BASED_OUTPATIENT_CLINIC_OR_DEPARTMENT_OTHER): Payer: Self-pay

## 2023-07-18 ENCOUNTER — Other Ambulatory Visit: Payer: Self-pay | Admitting: Family Medicine

## 2023-07-18 DIAGNOSIS — E1165 Type 2 diabetes mellitus with hyperglycemia: Secondary | ICD-10-CM

## 2023-07-18 MED ORDER — METFORMIN HCL ER 500 MG PO TB24
1000.0000 mg | ORAL_TABLET | Freq: Two times a day (BID) | ORAL | 0 refills | Status: DC
  Filled 2023-07-18: qty 120, 30d supply, fill #0

## 2023-07-21 ENCOUNTER — Other Ambulatory Visit (HOSPITAL_BASED_OUTPATIENT_CLINIC_OR_DEPARTMENT_OTHER): Payer: Self-pay

## 2023-07-21 DIAGNOSIS — R338 Other retention of urine: Secondary | ICD-10-CM | POA: Diagnosis not present

## 2023-07-21 DIAGNOSIS — N401 Enlarged prostate with lower urinary tract symptoms: Secondary | ICD-10-CM | POA: Diagnosis not present

## 2023-07-21 MED ORDER — DUTASTERIDE 0.5 MG PO CAPS
0.5000 mg | ORAL_CAPSULE | Freq: Every day | ORAL | 11 refills | Status: DC
  Filled 2023-07-21: qty 30, 30d supply, fill #0

## 2023-07-21 MED ORDER — TAMSULOSIN HCL 0.4 MG PO CAPS
0.4000 mg | ORAL_CAPSULE | Freq: Two times a day (BID) | ORAL | 11 refills | Status: DC
  Filled 2023-07-21: qty 60, 30d supply, fill #0

## 2023-07-22 DIAGNOSIS — Z7901 Long term (current) use of anticoagulants: Secondary | ICD-10-CM | POA: Diagnosis not present

## 2023-07-22 DIAGNOSIS — Z8673 Personal history of transient ischemic attack (TIA), and cerebral infarction without residual deficits: Secondary | ICD-10-CM | POA: Diagnosis not present

## 2023-07-22 DIAGNOSIS — I10 Essential (primary) hypertension: Secondary | ICD-10-CM | POA: Diagnosis not present

## 2023-07-22 DIAGNOSIS — Z950 Presence of cardiac pacemaker: Secondary | ICD-10-CM | POA: Diagnosis not present

## 2023-07-22 DIAGNOSIS — Z7984 Long term (current) use of oral hypoglycemic drugs: Secondary | ICD-10-CM | POA: Diagnosis not present

## 2023-07-22 DIAGNOSIS — E1140 Type 2 diabetes mellitus with diabetic neuropathy, unspecified: Secondary | ICD-10-CM | POA: Diagnosis not present

## 2023-07-22 DIAGNOSIS — Z794 Long term (current) use of insulin: Secondary | ICD-10-CM | POA: Diagnosis not present

## 2023-07-22 DIAGNOSIS — K219 Gastro-esophageal reflux disease without esophagitis: Secondary | ICD-10-CM | POA: Diagnosis not present

## 2023-07-22 DIAGNOSIS — Z556 Problems related to health literacy: Secondary | ICD-10-CM | POA: Diagnosis not present

## 2023-07-22 DIAGNOSIS — I959 Hypotension, unspecified: Secondary | ICD-10-CM | POA: Diagnosis not present

## 2023-07-22 DIAGNOSIS — F32A Depression, unspecified: Secondary | ICD-10-CM | POA: Diagnosis not present

## 2023-07-22 DIAGNOSIS — E7800 Pure hypercholesterolemia, unspecified: Secondary | ICD-10-CM | POA: Diagnosis not present

## 2023-07-22 DIAGNOSIS — N400 Enlarged prostate without lower urinary tract symptoms: Secondary | ICD-10-CM | POA: Diagnosis not present

## 2023-07-22 DIAGNOSIS — G5600 Carpal tunnel syndrome, unspecified upper limb: Secondary | ICD-10-CM | POA: Diagnosis not present

## 2023-07-22 DIAGNOSIS — E46 Unspecified protein-calorie malnutrition: Secondary | ICD-10-CM | POA: Diagnosis not present

## 2023-07-22 DIAGNOSIS — Z9181 History of falling: Secondary | ICD-10-CM | POA: Diagnosis not present

## 2023-07-25 ENCOUNTER — Other Ambulatory Visit: Payer: Self-pay | Admitting: Family Medicine

## 2023-07-25 ENCOUNTER — Other Ambulatory Visit (HOSPITAL_BASED_OUTPATIENT_CLINIC_OR_DEPARTMENT_OTHER): Payer: Self-pay

## 2023-07-25 DIAGNOSIS — E1165 Type 2 diabetes mellitus with hyperglycemia: Secondary | ICD-10-CM

## 2023-07-25 DIAGNOSIS — R112 Nausea with vomiting, unspecified: Secondary | ICD-10-CM

## 2023-07-26 ENCOUNTER — Ambulatory Visit: Admitting: Family Medicine

## 2023-07-26 ENCOUNTER — Other Ambulatory Visit (HOSPITAL_BASED_OUTPATIENT_CLINIC_OR_DEPARTMENT_OTHER): Payer: Self-pay

## 2023-07-26 MED ORDER — METFORMIN HCL ER 500 MG PO TB24
1000.0000 mg | ORAL_TABLET | Freq: Two times a day (BID) | ORAL | 0 refills | Status: DC
  Filled 2023-07-26: qty 120, 30d supply, fill #0

## 2023-07-26 MED ORDER — PROMETHAZINE HCL 25 MG PO TABS
12.5000 mg | ORAL_TABLET | Freq: Three times a day (TID) | ORAL | 0 refills | Status: DC | PRN
Start: 2023-07-26 — End: 2023-08-19
  Filled 2023-07-26: qty 20, 7d supply, fill #0

## 2023-07-27 ENCOUNTER — Ambulatory Visit: Admitting: Family Medicine

## 2023-07-27 DIAGNOSIS — Z9181 History of falling: Secondary | ICD-10-CM | POA: Diagnosis not present

## 2023-07-27 DIAGNOSIS — I10 Essential (primary) hypertension: Secondary | ICD-10-CM | POA: Diagnosis not present

## 2023-07-27 DIAGNOSIS — N400 Enlarged prostate without lower urinary tract symptoms: Secondary | ICD-10-CM | POA: Diagnosis not present

## 2023-07-27 DIAGNOSIS — Z794 Long term (current) use of insulin: Secondary | ICD-10-CM | POA: Diagnosis not present

## 2023-07-27 DIAGNOSIS — I959 Hypotension, unspecified: Secondary | ICD-10-CM | POA: Diagnosis not present

## 2023-07-27 DIAGNOSIS — Z950 Presence of cardiac pacemaker: Secondary | ICD-10-CM | POA: Diagnosis not present

## 2023-07-27 DIAGNOSIS — G5600 Carpal tunnel syndrome, unspecified upper limb: Secondary | ICD-10-CM | POA: Diagnosis not present

## 2023-07-27 DIAGNOSIS — Z7901 Long term (current) use of anticoagulants: Secondary | ICD-10-CM | POA: Diagnosis not present

## 2023-07-27 DIAGNOSIS — Z8673 Personal history of transient ischemic attack (TIA), and cerebral infarction without residual deficits: Secondary | ICD-10-CM | POA: Diagnosis not present

## 2023-07-27 DIAGNOSIS — E46 Unspecified protein-calorie malnutrition: Secondary | ICD-10-CM | POA: Diagnosis not present

## 2023-07-27 DIAGNOSIS — K219 Gastro-esophageal reflux disease without esophagitis: Secondary | ICD-10-CM | POA: Diagnosis not present

## 2023-07-27 DIAGNOSIS — E7800 Pure hypercholesterolemia, unspecified: Secondary | ICD-10-CM | POA: Diagnosis not present

## 2023-07-27 DIAGNOSIS — F32A Depression, unspecified: Secondary | ICD-10-CM | POA: Diagnosis not present

## 2023-07-27 DIAGNOSIS — E1140 Type 2 diabetes mellitus with diabetic neuropathy, unspecified: Secondary | ICD-10-CM | POA: Diagnosis not present

## 2023-07-27 DIAGNOSIS — Z556 Problems related to health literacy: Secondary | ICD-10-CM | POA: Diagnosis not present

## 2023-07-27 DIAGNOSIS — Z7984 Long term (current) use of oral hypoglycemic drugs: Secondary | ICD-10-CM | POA: Diagnosis not present

## 2023-07-31 DIAGNOSIS — E66811 Obesity, class 1: Secondary | ICD-10-CM | POA: Diagnosis not present

## 2023-07-31 DIAGNOSIS — G5600 Carpal tunnel syndrome, unspecified upper limb: Secondary | ICD-10-CM | POA: Diagnosis not present

## 2023-07-31 DIAGNOSIS — N189 Chronic kidney disease, unspecified: Secondary | ICD-10-CM | POA: Diagnosis not present

## 2023-07-31 DIAGNOSIS — F325 Major depressive disorder, single episode, in full remission: Secondary | ICD-10-CM | POA: Diagnosis not present

## 2023-07-31 DIAGNOSIS — I129 Hypertensive chronic kidney disease with stage 1 through stage 4 chronic kidney disease, or unspecified chronic kidney disease: Secondary | ICD-10-CM | POA: Diagnosis not present

## 2023-07-31 DIAGNOSIS — E1142 Type 2 diabetes mellitus with diabetic polyneuropathy: Secondary | ICD-10-CM | POA: Diagnosis not present

## 2023-07-31 DIAGNOSIS — H43819 Vitreous degeneration, unspecified eye: Secondary | ICD-10-CM | POA: Diagnosis not present

## 2023-07-31 DIAGNOSIS — K219 Gastro-esophageal reflux disease without esophagitis: Secondary | ICD-10-CM | POA: Diagnosis not present

## 2023-07-31 DIAGNOSIS — D631 Anemia in chronic kidney disease: Secondary | ICD-10-CM | POA: Diagnosis not present

## 2023-07-31 DIAGNOSIS — E7800 Pure hypercholesterolemia, unspecified: Secondary | ICD-10-CM | POA: Diagnosis not present

## 2023-07-31 DIAGNOSIS — E1122 Type 2 diabetes mellitus with diabetic chronic kidney disease: Secondary | ICD-10-CM | POA: Diagnosis not present

## 2023-07-31 DIAGNOSIS — Z950 Presence of cardiac pacemaker: Secondary | ICD-10-CM | POA: Diagnosis not present

## 2023-07-31 DIAGNOSIS — Z7984 Long term (current) use of oral hypoglycemic drugs: Secondary | ICD-10-CM | POA: Diagnosis not present

## 2023-07-31 DIAGNOSIS — I959 Hypotension, unspecified: Secondary | ICD-10-CM | POA: Diagnosis not present

## 2023-07-31 DIAGNOSIS — Z8616 Personal history of COVID-19: Secondary | ICD-10-CM | POA: Diagnosis not present

## 2023-07-31 DIAGNOSIS — Z853 Personal history of malignant neoplasm of breast: Secondary | ICD-10-CM | POA: Diagnosis not present

## 2023-07-31 DIAGNOSIS — E860 Dehydration: Secondary | ICD-10-CM | POA: Diagnosis not present

## 2023-07-31 DIAGNOSIS — E43 Unspecified severe protein-calorie malnutrition: Secondary | ICD-10-CM | POA: Diagnosis not present

## 2023-07-31 DIAGNOSIS — K589 Irritable bowel syndrome without diarrhea: Secondary | ICD-10-CM | POA: Diagnosis not present

## 2023-07-31 DIAGNOSIS — Z9181 History of falling: Secondary | ICD-10-CM | POA: Diagnosis not present

## 2023-07-31 DIAGNOSIS — G4700 Insomnia, unspecified: Secondary | ICD-10-CM | POA: Diagnosis not present

## 2023-07-31 DIAGNOSIS — N400 Enlarged prostate without lower urinary tract symptoms: Secondary | ICD-10-CM | POA: Diagnosis not present

## 2023-08-02 ENCOUNTER — Ambulatory Visit: Admitting: Family Medicine

## 2023-08-03 ENCOUNTER — Telehealth: Payer: Self-pay | Admitting: Family Medicine

## 2023-08-03 NOTE — Telephone Encounter (Signed)
 Copied from CRM [id number]. Topic: Medicare AWV >> Aug 03, 2023 10:01 AM Juliana Ocean wrote: Reason for CRM: LVM 08/03/2023 to schedule AWV. Please schedule Virtual or Telehealth visits ONLY.   Rosalee Collins; Care Guide Ambulatory Clinical Support  l [facility] Direct Dial : [phone]

## 2023-08-05 ENCOUNTER — Other Ambulatory Visit (HOSPITAL_BASED_OUTPATIENT_CLINIC_OR_DEPARTMENT_OTHER): Payer: Self-pay

## 2023-08-05 DIAGNOSIS — E860 Dehydration: Secondary | ICD-10-CM | POA: Diagnosis not present

## 2023-08-05 DIAGNOSIS — E43 Unspecified severe protein-calorie malnutrition: Secondary | ICD-10-CM | POA: Diagnosis not present

## 2023-08-05 DIAGNOSIS — K219 Gastro-esophageal reflux disease without esophagitis: Secondary | ICD-10-CM | POA: Diagnosis not present

## 2023-08-05 DIAGNOSIS — Z9181 History of falling: Secondary | ICD-10-CM | POA: Diagnosis not present

## 2023-08-05 DIAGNOSIS — N189 Chronic kidney disease, unspecified: Secondary | ICD-10-CM | POA: Diagnosis not present

## 2023-08-05 DIAGNOSIS — G4700 Insomnia, unspecified: Secondary | ICD-10-CM | POA: Diagnosis not present

## 2023-08-05 DIAGNOSIS — Z7984 Long term (current) use of oral hypoglycemic drugs: Secondary | ICD-10-CM | POA: Diagnosis not present

## 2023-08-05 DIAGNOSIS — E7800 Pure hypercholesterolemia, unspecified: Secondary | ICD-10-CM | POA: Diagnosis not present

## 2023-08-05 DIAGNOSIS — N400 Enlarged prostate without lower urinary tract symptoms: Secondary | ICD-10-CM | POA: Diagnosis not present

## 2023-08-05 DIAGNOSIS — E1122 Type 2 diabetes mellitus with diabetic chronic kidney disease: Secondary | ICD-10-CM | POA: Diagnosis not present

## 2023-08-05 DIAGNOSIS — E66811 Obesity, class 1: Secondary | ICD-10-CM | POA: Diagnosis not present

## 2023-08-05 DIAGNOSIS — I959 Hypotension, unspecified: Secondary | ICD-10-CM | POA: Diagnosis not present

## 2023-08-05 DIAGNOSIS — Z853 Personal history of malignant neoplasm of breast: Secondary | ICD-10-CM | POA: Diagnosis not present

## 2023-08-05 DIAGNOSIS — H43819 Vitreous degeneration, unspecified eye: Secondary | ICD-10-CM | POA: Diagnosis not present

## 2023-08-05 DIAGNOSIS — E1142 Type 2 diabetes mellitus with diabetic polyneuropathy: Secondary | ICD-10-CM | POA: Diagnosis not present

## 2023-08-05 DIAGNOSIS — Z950 Presence of cardiac pacemaker: Secondary | ICD-10-CM | POA: Diagnosis not present

## 2023-08-05 DIAGNOSIS — K589 Irritable bowel syndrome without diarrhea: Secondary | ICD-10-CM | POA: Diagnosis not present

## 2023-08-05 DIAGNOSIS — G5600 Carpal tunnel syndrome, unspecified upper limb: Secondary | ICD-10-CM | POA: Diagnosis not present

## 2023-08-05 DIAGNOSIS — F325 Major depressive disorder, single episode, in full remission: Secondary | ICD-10-CM | POA: Diagnosis not present

## 2023-08-05 DIAGNOSIS — I129 Hypertensive chronic kidney disease with stage 1 through stage 4 chronic kidney disease, or unspecified chronic kidney disease: Secondary | ICD-10-CM | POA: Diagnosis not present

## 2023-08-05 DIAGNOSIS — Z8616 Personal history of COVID-19: Secondary | ICD-10-CM | POA: Diagnosis not present

## 2023-08-05 DIAGNOSIS — D631 Anemia in chronic kidney disease: Secondary | ICD-10-CM | POA: Diagnosis not present

## 2023-08-06 ENCOUNTER — Emergency Department (HOSPITAL_COMMUNITY)

## 2023-08-06 ENCOUNTER — Other Ambulatory Visit: Payer: Self-pay

## 2023-08-06 ENCOUNTER — Inpatient Hospital Stay (HOSPITAL_COMMUNITY)

## 2023-08-06 ENCOUNTER — Inpatient Hospital Stay (HOSPITAL_COMMUNITY)
Admission: EM | Admit: 2023-08-06 | Discharge: 2023-08-13 | DRG: 981 | Disposition: A | Discharge status: Rehabilitation Facility | Attending: Internal Medicine | Admitting: Internal Medicine

## 2023-08-06 ENCOUNTER — Encounter (HOSPITAL_COMMUNITY): Payer: Self-pay

## 2023-08-06 DIAGNOSIS — R8271 Bacteriuria: Secondary | ICD-10-CM | POA: Insufficient documentation

## 2023-08-06 DIAGNOSIS — G9341 Metabolic encephalopathy: Secondary | ICD-10-CM | POA: Diagnosis present

## 2023-08-06 DIAGNOSIS — E66811 Obesity, class 1: Secondary | ICD-10-CM | POA: Diagnosis present

## 2023-08-06 DIAGNOSIS — Z823 Family history of stroke: Secondary | ICD-10-CM

## 2023-08-06 DIAGNOSIS — Z833 Family history of diabetes mellitus: Secondary | ICD-10-CM | POA: Diagnosis not present

## 2023-08-06 DIAGNOSIS — R338 Other retention of urine: Secondary | ICD-10-CM | POA: Diagnosis not present

## 2023-08-06 DIAGNOSIS — W1830XA Fall on same level, unspecified, initial encounter: Secondary | ICD-10-CM | POA: Diagnosis present

## 2023-08-06 DIAGNOSIS — I214 Non-ST elevation (NSTEMI) myocardial infarction: Secondary | ICD-10-CM

## 2023-08-06 DIAGNOSIS — I255 Ischemic cardiomyopathy: Secondary | ICD-10-CM | POA: Diagnosis not present

## 2023-08-06 DIAGNOSIS — Z7984 Long term (current) use of oral hypoglycemic drugs: Secondary | ICD-10-CM | POA: Diagnosis not present

## 2023-08-06 DIAGNOSIS — K219 Gastro-esophageal reflux disease without esophagitis: Secondary | ICD-10-CM | POA: Diagnosis not present

## 2023-08-06 DIAGNOSIS — Z66 Do not resuscitate: Secondary | ICD-10-CM | POA: Diagnosis present

## 2023-08-06 DIAGNOSIS — K760 Fatty (change of) liver, not elsewhere classified: Secondary | ICD-10-CM | POA: Diagnosis not present

## 2023-08-06 DIAGNOSIS — K838 Other specified diseases of biliary tract: Secondary | ICD-10-CM | POA: Diagnosis not present

## 2023-08-06 DIAGNOSIS — N400 Enlarged prostate without lower urinary tract symptoms: Secondary | ICD-10-CM | POA: Diagnosis not present

## 2023-08-06 DIAGNOSIS — Z515 Encounter for palliative care: Secondary | ICD-10-CM | POA: Diagnosis not present

## 2023-08-06 DIAGNOSIS — Z888 Allergy status to other drugs, medicaments and biological substances status: Secondary | ICD-10-CM

## 2023-08-06 DIAGNOSIS — Z7901 Long term (current) use of anticoagulants: Secondary | ICD-10-CM

## 2023-08-06 DIAGNOSIS — I2510 Atherosclerotic heart disease of native coronary artery without angina pectoris: Secondary | ICD-10-CM | POA: Diagnosis present

## 2023-08-06 DIAGNOSIS — Z950 Presence of cardiac pacemaker: Secondary | ICD-10-CM | POA: Diagnosis not present

## 2023-08-06 DIAGNOSIS — Z9181 History of falling: Secondary | ICD-10-CM

## 2023-08-06 DIAGNOSIS — Y738 Miscellaneous gastroenterology and urology devices associated with adverse incidents, not elsewhere classified: Secondary | ICD-10-CM | POA: Diagnosis not present

## 2023-08-06 DIAGNOSIS — E119 Type 2 diabetes mellitus without complications: Secondary | ICD-10-CM | POA: Diagnosis not present

## 2023-08-06 DIAGNOSIS — K828 Other specified diseases of gallbladder: Secondary | ICD-10-CM | POA: Diagnosis present

## 2023-08-06 DIAGNOSIS — Y92009 Unspecified place in unspecified non-institutional (private) residence as the place of occurrence of the external cause: Secondary | ICD-10-CM

## 2023-08-06 DIAGNOSIS — L89159 Pressure ulcer of sacral region, unspecified stage: Secondary | ICD-10-CM | POA: Diagnosis present

## 2023-08-06 DIAGNOSIS — R5381 Other malaise: Secondary | ICD-10-CM | POA: Diagnosis not present

## 2023-08-06 DIAGNOSIS — Z7189 Other specified counseling: Secondary | ICD-10-CM | POA: Diagnosis not present

## 2023-08-06 DIAGNOSIS — N401 Enlarged prostate with lower urinary tract symptoms: Secondary | ICD-10-CM | POA: Diagnosis not present

## 2023-08-06 DIAGNOSIS — D509 Iron deficiency anemia, unspecified: Secondary | ICD-10-CM | POA: Diagnosis not present

## 2023-08-06 DIAGNOSIS — E43 Unspecified severe protein-calorie malnutrition: Secondary | ICD-10-CM | POA: Diagnosis not present

## 2023-08-06 DIAGNOSIS — N390 Urinary tract infection, site not specified: Secondary | ICD-10-CM | POA: Diagnosis not present

## 2023-08-06 DIAGNOSIS — A419 Sepsis, unspecified organism: Secondary | ICD-10-CM | POA: Diagnosis present

## 2023-08-06 DIAGNOSIS — R410 Disorientation, unspecified: Secondary | ICD-10-CM | POA: Diagnosis not present

## 2023-08-06 DIAGNOSIS — I5020 Unspecified systolic (congestive) heart failure: Secondary | ICD-10-CM | POA: Diagnosis not present

## 2023-08-06 DIAGNOSIS — R0989 Other specified symptoms and signs involving the circulatory and respiratory systems: Secondary | ICD-10-CM | POA: Diagnosis not present

## 2023-08-06 DIAGNOSIS — I442 Atrioventricular block, complete: Secondary | ICD-10-CM | POA: Diagnosis not present

## 2023-08-06 DIAGNOSIS — I8290 Acute embolism and thrombosis of unspecified vein: Secondary | ICD-10-CM | POA: Insufficient documentation

## 2023-08-06 DIAGNOSIS — N138 Other obstructive and reflux uropathy: Secondary | ICD-10-CM | POA: Diagnosis present

## 2023-08-06 DIAGNOSIS — R9082 White matter disease, unspecified: Secondary | ICD-10-CM | POA: Diagnosis not present

## 2023-08-06 DIAGNOSIS — Z86711 Personal history of pulmonary embolism: Secondary | ICD-10-CM

## 2023-08-06 DIAGNOSIS — T83511A Infection and inflammatory reaction due to indwelling urethral catheter, initial encounter: Secondary | ICD-10-CM | POA: Diagnosis not present

## 2023-08-06 DIAGNOSIS — G6289 Other specified polyneuropathies: Secondary | ICD-10-CM

## 2023-08-06 DIAGNOSIS — I110 Hypertensive heart disease with heart failure: Secondary | ICD-10-CM | POA: Diagnosis not present

## 2023-08-06 DIAGNOSIS — A4151 Sepsis due to Escherichia coli [E. coli]: Secondary | ICD-10-CM | POA: Diagnosis not present

## 2023-08-06 DIAGNOSIS — R404 Transient alteration of awareness: Secondary | ICD-10-CM | POA: Diagnosis not present

## 2023-08-06 DIAGNOSIS — R6521 Severe sepsis with septic shock: Secondary | ICD-10-CM | POA: Diagnosis not present

## 2023-08-06 DIAGNOSIS — G9339 Other post infection and related fatigue syndromes: Secondary | ICD-10-CM | POA: Diagnosis not present

## 2023-08-06 DIAGNOSIS — K8080 Other cholelithiasis without obstruction: Secondary | ICD-10-CM | POA: Diagnosis not present

## 2023-08-06 DIAGNOSIS — E6609 Other obesity due to excess calories: Secondary | ICD-10-CM

## 2023-08-06 DIAGNOSIS — I5032 Chronic diastolic (congestive) heart failure: Secondary | ICD-10-CM | POA: Diagnosis present

## 2023-08-06 DIAGNOSIS — I351 Nonrheumatic aortic (valve) insufficiency: Secondary | ICD-10-CM | POA: Diagnosis not present

## 2023-08-06 DIAGNOSIS — E1142 Type 2 diabetes mellitus with diabetic polyneuropathy: Secondary | ICD-10-CM | POA: Diagnosis not present

## 2023-08-06 DIAGNOSIS — Z807 Family history of other malignant neoplasms of lymphoid, hematopoietic and related tissues: Secondary | ICD-10-CM

## 2023-08-06 DIAGNOSIS — E440 Moderate protein-calorie malnutrition: Secondary | ICD-10-CM | POA: Diagnosis not present

## 2023-08-06 DIAGNOSIS — K8020 Calculus of gallbladder without cholecystitis without obstruction: Secondary | ICD-10-CM | POA: Diagnosis not present

## 2023-08-06 DIAGNOSIS — B9620 Unspecified Escherichia coli [E. coli] as the cause of diseases classified elsewhere: Secondary | ICD-10-CM

## 2023-08-06 DIAGNOSIS — Z6831 Body mass index (BMI) 31.0-31.9, adult: Secondary | ICD-10-CM

## 2023-08-06 DIAGNOSIS — Z1152 Encounter for screening for COVID-19: Secondary | ICD-10-CM

## 2023-08-06 DIAGNOSIS — G9340 Encephalopathy, unspecified: Secondary | ICD-10-CM | POA: Diagnosis present

## 2023-08-06 DIAGNOSIS — E1165 Type 2 diabetes mellitus with hyperglycemia: Secondary | ICD-10-CM | POA: Diagnosis present

## 2023-08-06 DIAGNOSIS — Z794 Long term (current) use of insulin: Secondary | ICD-10-CM

## 2023-08-06 DIAGNOSIS — J9811 Atelectasis: Secondary | ICD-10-CM | POA: Diagnosis not present

## 2023-08-06 DIAGNOSIS — Y846 Urinary catheterization as the cause of abnormal reaction of the patient, or of later complication, without mention of misadventure at the time of the procedure: Secondary | ICD-10-CM | POA: Diagnosis present

## 2023-08-06 DIAGNOSIS — Z955 Presence of coronary angioplasty implant and graft: Secondary | ICD-10-CM

## 2023-08-06 DIAGNOSIS — R1110 Vomiting, unspecified: Secondary | ICD-10-CM | POA: Diagnosis not present

## 2023-08-06 DIAGNOSIS — E785 Hyperlipidemia, unspecified: Secondary | ICD-10-CM | POA: Diagnosis present

## 2023-08-06 DIAGNOSIS — W19XXXA Unspecified fall, initial encounter: Secondary | ICD-10-CM | POA: Diagnosis not present

## 2023-08-06 DIAGNOSIS — Z9842 Cataract extraction status, left eye: Secondary | ICD-10-CM

## 2023-08-06 DIAGNOSIS — D649 Anemia, unspecified: Secondary | ICD-10-CM | POA: Diagnosis not present

## 2023-08-06 DIAGNOSIS — I4510 Unspecified right bundle-branch block: Secondary | ICD-10-CM | POA: Diagnosis present

## 2023-08-06 DIAGNOSIS — R7881 Bacteremia: Secondary | ICD-10-CM | POA: Diagnosis not present

## 2023-08-06 DIAGNOSIS — T83031A Leakage of indwelling urethral catheter, initial encounter: Secondary | ICD-10-CM | POA: Diagnosis not present

## 2023-08-06 DIAGNOSIS — I69354 Hemiplegia and hemiparesis following cerebral infarction affecting left non-dominant side: Secondary | ICD-10-CM | POA: Diagnosis not present

## 2023-08-06 DIAGNOSIS — Z79899 Other long term (current) drug therapy: Secondary | ICD-10-CM | POA: Diagnosis not present

## 2023-08-06 DIAGNOSIS — I10 Essential (primary) hypertension: Secondary | ICD-10-CM | POA: Diagnosis not present

## 2023-08-06 DIAGNOSIS — E538 Deficiency of other specified B group vitamins: Secondary | ICD-10-CM | POA: Insufficient documentation

## 2023-08-06 DIAGNOSIS — R0689 Other abnormalities of breathing: Secondary | ICD-10-CM | POA: Diagnosis not present

## 2023-08-06 DIAGNOSIS — B955 Unspecified streptococcus as the cause of diseases classified elsewhere: Secondary | ICD-10-CM

## 2023-08-06 DIAGNOSIS — E872 Acidosis, unspecified: Secondary | ICD-10-CM

## 2023-08-06 DIAGNOSIS — R339 Retention of urine, unspecified: Secondary | ICD-10-CM | POA: Diagnosis not present

## 2023-08-06 DIAGNOSIS — U099 Post covid-19 condition, unspecified: Secondary | ICD-10-CM | POA: Diagnosis not present

## 2023-08-06 DIAGNOSIS — R6520 Severe sepsis without septic shock: Secondary | ICD-10-CM | POA: Diagnosis present

## 2023-08-06 DIAGNOSIS — I950 Idiopathic hypotension: Secondary | ICD-10-CM | POA: Diagnosis not present

## 2023-08-06 DIAGNOSIS — G629 Polyneuropathy, unspecified: Secondary | ICD-10-CM

## 2023-08-06 DIAGNOSIS — B961 Klebsiella pneumoniae [K. pneumoniae] as the cause of diseases classified elsewhere: Secondary | ICD-10-CM | POA: Diagnosis not present

## 2023-08-06 DIAGNOSIS — Z79810 Long term (current) use of selective estrogen receptor modulators (SERMs): Secondary | ICD-10-CM

## 2023-08-06 DIAGNOSIS — R4189 Other symptoms and signs involving cognitive functions and awareness: Secondary | ICD-10-CM | POA: Diagnosis not present

## 2023-08-06 DIAGNOSIS — Z853 Personal history of malignant neoplasm of breast: Secondary | ICD-10-CM | POA: Diagnosis not present

## 2023-08-06 DIAGNOSIS — K8011 Calculus of gallbladder with chronic cholecystitis with obstruction: Secondary | ICD-10-CM | POA: Diagnosis not present

## 2023-08-06 DIAGNOSIS — L89151 Pressure ulcer of sacral region, stage 1: Secondary | ICD-10-CM | POA: Diagnosis present

## 2023-08-06 DIAGNOSIS — I5022 Chronic systolic (congestive) heart failure: Secondary | ICD-10-CM | POA: Diagnosis not present

## 2023-08-06 DIAGNOSIS — E7800 Pure hypercholesterolemia, unspecified: Secondary | ICD-10-CM | POA: Diagnosis not present

## 2023-08-06 DIAGNOSIS — R109 Unspecified abdominal pain: Secondary | ICD-10-CM | POA: Diagnosis not present

## 2023-08-06 DIAGNOSIS — R41844 Frontal lobe and executive function deficit: Secondary | ICD-10-CM | POA: Diagnosis not present

## 2023-08-06 DIAGNOSIS — K8010 Calculus of gallbladder with chronic cholecystitis without obstruction: Secondary | ICD-10-CM | POA: Diagnosis present

## 2023-08-06 DIAGNOSIS — K5900 Constipation, unspecified: Secondary | ICD-10-CM | POA: Diagnosis not present

## 2023-08-06 DIAGNOSIS — Z8041 Family history of malignant neoplasm of ovary: Secondary | ICD-10-CM

## 2023-08-06 DIAGNOSIS — E876 Hypokalemia: Secondary | ICD-10-CM | POA: Diagnosis not present

## 2023-08-06 DIAGNOSIS — Z91048 Other nonmedicinal substance allergy status: Secondary | ICD-10-CM

## 2023-08-06 DIAGNOSIS — B9629 Other Escherichia coli [E. coli] as the cause of diseases classified elsewhere: Secondary | ICD-10-CM | POA: Diagnosis not present

## 2023-08-06 DIAGNOSIS — R4182 Altered mental status, unspecified: Secondary | ICD-10-CM | POA: Diagnosis not present

## 2023-08-06 DIAGNOSIS — R296 Repeated falls: Secondary | ICD-10-CM | POA: Diagnosis present

## 2023-08-06 DIAGNOSIS — Z9049 Acquired absence of other specified parts of digestive tract: Secondary | ICD-10-CM

## 2023-08-06 DIAGNOSIS — I951 Orthostatic hypotension: Secondary | ICD-10-CM | POA: Diagnosis not present

## 2023-08-06 DIAGNOSIS — Z6827 Body mass index (BMI) 27.0-27.9, adult: Secondary | ICD-10-CM

## 2023-08-06 DIAGNOSIS — R000 Tachycardia, unspecified: Secondary | ICD-10-CM | POA: Diagnosis not present

## 2023-08-06 DIAGNOSIS — I959 Hypotension, unspecified: Secondary | ICD-10-CM | POA: Diagnosis not present

## 2023-08-06 DIAGNOSIS — A499 Bacterial infection, unspecified: Secondary | ICD-10-CM | POA: Diagnosis not present

## 2023-08-06 DIAGNOSIS — S0990XA Unspecified injury of head, initial encounter: Secondary | ICD-10-CM | POA: Diagnosis not present

## 2023-08-06 DIAGNOSIS — Z86718 Personal history of other venous thrombosis and embolism: Secondary | ICD-10-CM

## 2023-08-06 DIAGNOSIS — Z9841 Cataract extraction status, right eye: Secondary | ICD-10-CM

## 2023-08-06 DIAGNOSIS — R82998 Other abnormal findings in urine: Secondary | ICD-10-CM | POA: Diagnosis not present

## 2023-08-06 DIAGNOSIS — I25810 Atherosclerosis of coronary artery bypass graft(s) without angina pectoris: Secondary | ICD-10-CM | POA: Diagnosis not present

## 2023-08-06 DIAGNOSIS — Z9011 Acquired absence of right breast and nipple: Secondary | ICD-10-CM

## 2023-08-06 DIAGNOSIS — R7989 Other specified abnormal findings of blood chemistry: Secondary | ICD-10-CM | POA: Diagnosis not present

## 2023-08-06 DIAGNOSIS — R188 Other ascites: Secondary | ICD-10-CM | POA: Diagnosis not present

## 2023-08-06 DIAGNOSIS — I6782 Cerebral ischemia: Secondary | ICD-10-CM | POA: Diagnosis not present

## 2023-08-06 DIAGNOSIS — Z9221 Personal history of antineoplastic chemotherapy: Secondary | ICD-10-CM

## 2023-08-06 DIAGNOSIS — R1111 Vomiting without nausea: Secondary | ICD-10-CM | POA: Diagnosis not present

## 2023-08-06 DIAGNOSIS — I9589 Other hypotension: Secondary | ICD-10-CM | POA: Diagnosis not present

## 2023-08-06 LAB — RESP PANEL BY RT-PCR (RSV, FLU A&B, COVID)  RVPGX2
Influenza A by PCR: NEGATIVE
Influenza B by PCR: NEGATIVE
Resp Syncytial Virus by PCR: NEGATIVE
SARS Coronavirus 2 by RT PCR: NEGATIVE

## 2023-08-06 LAB — COMPREHENSIVE METABOLIC PANEL WITH GFR
ALT: 17 U/L (ref 0–44)
AST: 29 U/L (ref 15–41)
Albumin: 3.4 g/dL — ABNORMAL LOW (ref 3.5–5.0)
Alkaline Phosphatase: 65 U/L (ref 38–126)
Anion gap: 19 — ABNORMAL HIGH (ref 5–15)
BUN: 12 mg/dL (ref 8–23)
CO2: 16 mmol/L — ABNORMAL LOW (ref 22–32)
Calcium: 9 mg/dL (ref 8.9–10.3)
Chloride: 101 mmol/L (ref 98–111)
Creatinine, Ser: 1.11 mg/dL (ref 0.61–1.24)
GFR, Estimated: >60 mL/min (ref >60)
Glucose, Bld: 190 mg/dL — ABNORMAL HIGH (ref 70–99)
Potassium: 3.8 mmol/L (ref 3.5–5.1)
Sodium: 136 mmol/L (ref 135–145)
Total Bilirubin: 1.9 mg/dL — ABNORMAL HIGH (ref 0.0–1.2)
Total Protein: 6.5 g/dL (ref 6.5–8.1)

## 2023-08-06 LAB — I-STAT CG4 LACTIC ACID, ED
Lactic Acid, Venous: 7.1 mmol/L (ref 0.5–1.9)
Lactic Acid, Venous: 2.7 mmol/L (ref 0.5–1.9)

## 2023-08-06 LAB — URINALYSIS, W/ REFLEX TO CULTURE (INFECTION SUSPECTED)
Bilirubin Urine: NEGATIVE
Glucose, UA: >=500 mg/dL — AB
Ketones, ur: 5 mg/dL — AB
Nitrite: NEGATIVE
Protein, ur: 30 mg/dL — AB
Specific Gravity, Urine: 1.015 (ref 1.005–1.030)
WBC, UA: >50 WBC/hpf (ref 0–5)
pH: 5 (ref 5.0–8.0)

## 2023-08-06 LAB — CBC WITH DIFFERENTIAL/PLATELET
Abs Immature Granulocytes: 0.07 10*3/uL (ref 0.00–0.07)
Basophils Absolute: 0 10*3/uL (ref 0.0–0.1)
Basophils Relative: 0 %
Eosinophils Absolute: 0 10*3/uL (ref 0.0–0.5)
Eosinophils Relative: 0 %
HCT: 37.1 % — ABNORMAL LOW (ref 39.0–52.0)
Hemoglobin: 11.8 g/dL — ABNORMAL LOW (ref 13.0–17.0)
Immature Granulocytes: 1 %
Lymphocytes Relative: 4 %
Lymphs Abs: 0.4 10*3/uL — ABNORMAL LOW (ref 0.7–4.0)
MCH: 28.1 pg (ref 26.0–34.0)
MCHC: 31.8 g/dL (ref 30.0–36.0)
MCV: 88.3 fL (ref 80.0–100.0)
Monocytes Absolute: 0.5 10*3/uL (ref 0.1–1.0)
Monocytes Relative: 5 %
Neutro Abs: 8.5 10*3/uL — ABNORMAL HIGH (ref 1.7–7.7)
Neutrophils Relative %: 90 %
Platelets: 252 10*3/uL (ref 150–400)
RBC: 4.2 MIL/uL — ABNORMAL LOW (ref 4.22–5.81)
RDW: 18.9 % — ABNORMAL HIGH (ref 11.5–15.5)
WBC: 9.6 10*3/uL (ref 4.0–10.5)
nRBC: 0 % (ref 0.0–0.2)

## 2023-08-06 LAB — I-STAT CHEM 8, ED
BUN: 10 mg/dL (ref 8–23)
Calcium, Ion: 1.04 mmol/L — ABNORMAL LOW (ref 1.15–1.40)
Chloride: 102 mmol/L (ref 98–111)
Creatinine, Ser: 0.8 mg/dL (ref 0.61–1.24)
Glucose, Bld: 185 mg/dL — ABNORMAL HIGH (ref 70–99)
HCT: 37 % — ABNORMAL LOW (ref 39.0–52.0)
Hemoglobin: 12.6 g/dL — ABNORMAL LOW (ref 13.0–17.0)
Potassium: 3.7 mmol/L (ref 3.5–5.1)
Sodium: 137 mmol/L (ref 135–145)
TCO2: 18 mmol/L — ABNORMAL LOW (ref 22–32)

## 2023-08-06 LAB — I-STAT VENOUS BLOOD GAS, ED
Acid-base deficit: 5 mmol/L — ABNORMAL HIGH (ref 0.0–2.0)
Bicarbonate: 18.5 mmol/L — ABNORMAL LOW (ref 20.0–28.0)
Calcium, Ion: 1.04 mmol/L — ABNORMAL LOW (ref 1.15–1.40)
HCT: 35 % — ABNORMAL LOW (ref 39.0–52.0)
Hemoglobin: 11.9 g/dL — ABNORMAL LOW (ref 13.0–17.0)
O2 Saturation: 93 %
Potassium: 3.7 mmol/L (ref 3.5–5.1)
Sodium: 137 mmol/L (ref 135–145)
TCO2: 19 mmol/L — ABNORMAL LOW (ref 22–32)
pCO2, Ven: 29 mmHg — ABNORMAL LOW (ref 44–60)
pH, Ven: 7.413 (ref 7.25–7.43)
pO2, Ven: 66 mmHg — ABNORMAL HIGH (ref 32–45)

## 2023-08-06 LAB — GLUCOSE, CAPILLARY: Glucose-Capillary: 178 mg/dL — ABNORMAL HIGH (ref 70–99)

## 2023-08-06 LAB — HEMOGLOBIN A1C
Hgb A1c MFr Bld: 7.5 % — ABNORMAL HIGH (ref 4.8–5.6)
Mean Plasma Glucose: 168.55 mg/dL

## 2023-08-06 LAB — BRAIN NATRIURETIC PEPTIDE: B Natriuretic Peptide: 307.2 pg/mL — ABNORMAL HIGH (ref 0.0–100.0)

## 2023-08-06 LAB — RETICULOCYTES
Immature Retic Fract: 30.6 % — ABNORMAL HIGH (ref 2.3–15.9)
RBC.: 4.21 MIL/uL — ABNORMAL LOW (ref 4.22–5.81)
Retic Count, Absolute: 91.8 10*3/uL (ref 19.0–186.0)
Retic Ct Pct: 2.2 % (ref 0.4–3.1)

## 2023-08-06 LAB — TSH: TSH: 0.71 u[IU]/mL (ref 0.350–4.500)

## 2023-08-06 MED ORDER — INSULIN ASPART 100 UNIT/ML IJ SOLN
0.0000 [IU] | INTRAMUSCULAR | Status: DC
  Administered 2023-08-06 – 2023-08-07 (×2): 2 [IU] via SUBCUTANEOUS

## 2023-08-06 MED ORDER — TAMSULOSIN HCL 0.4 MG PO CAPS
0.4000 mg | ORAL_CAPSULE | Freq: Every day | ORAL | Status: DC
  Administered 2023-08-07 – 2023-08-13 (×8): 0.4 mg via ORAL
  Filled 2023-08-06 (×8): qty 1

## 2023-08-06 MED ORDER — ONDANSETRON HCL 4 MG/2ML IJ SOLN
4.0000 mg | Freq: Once | INTRAMUSCULAR | Status: AC
  Administered 2023-08-06: 4 mg via INTRAVENOUS
  Filled 2023-08-06: qty 2

## 2023-08-06 MED ORDER — ACETAMINOPHEN 650 MG RE SUPP: 650.0000 mg | Freq: Four times a day (QID) | RECTAL | Status: DC | PRN

## 2023-08-06 MED ORDER — SODIUM CHLORIDE 0.9 % IV SOLN
2.0000 g | Freq: Once | INTRAVENOUS | Status: AC
  Administered 2023-08-06: 2 g via INTRAVENOUS
  Filled 2023-08-06: qty 12.5

## 2023-08-06 MED ORDER — CHLORHEXIDINE GLUCONATE CLOTH 2 % EX PADS
6.0000 | MEDICATED_PAD | Freq: Every day | CUTANEOUS | Status: DC
  Administered 2023-08-07 – 2023-08-13 (×7): 6 via TOPICAL

## 2023-08-06 MED ORDER — ONDANSETRON HCL 4 MG PO TABS: 4.0000 mg | ORAL_TABLET | Freq: Four times a day (QID) | ORAL | Status: DC | PRN

## 2023-08-06 MED ORDER — SODIUM CHLORIDE 0.9 % IV SOLN
2.0000 g | Freq: Three times a day (TID) | INTRAVENOUS | Status: DC
  Administered 2023-08-07: 2 g via INTRAVENOUS
  Filled 2023-08-06: qty 12.5

## 2023-08-06 MED ORDER — CHLORHEXIDINE GLUCONATE CLOTH 2 % EX PADS: 6.0000 | MEDICATED_PAD | Freq: Every day | CUTANEOUS | Status: DC

## 2023-08-06 MED ORDER — IOHEXOL 350 MG/ML SOLN
75.0000 mL | Freq: Once | INTRAVENOUS | Status: AC | PRN
  Administered 2023-08-06: 75 mL via INTRAVENOUS

## 2023-08-06 MED ORDER — ACETAMINOPHEN 325 MG PO TABS: 650.0000 mg | ORAL_TABLET | Freq: Four times a day (QID) | ORAL | Status: DC | PRN

## 2023-08-06 MED ORDER — LACTATED RINGERS IV BOLUS
1000.0000 mL | Freq: Once | INTRAVENOUS | Status: AC
  Administered 2023-08-06: 1000 mL via INTRAVENOUS

## 2023-08-06 MED ORDER — PANTOPRAZOLE SODIUM 40 MG IV SOLR
40.0000 mg | Freq: Every day | INTRAVENOUS | Status: DC
  Administered 2023-08-06 – 2023-08-07 (×2): 40 mg via INTRAVENOUS
  Filled 2023-08-06 (×2): qty 10

## 2023-08-06 MED ORDER — NYSTATIN 100000 UNIT/GM EX POWD
Freq: Three times a day (TID) | CUTANEOUS | Status: DC
  Filled 2023-08-06: qty 15

## 2023-08-06 MED ORDER — ONDANSETRON HCL 4 MG/2ML IJ SOLN
4.0000 mg | Freq: Four times a day (QID) | INTRAMUSCULAR | Status: DC | PRN
  Administered 2023-08-10: 4 mg via INTRAVENOUS
  Filled 2023-08-06: qty 2

## 2023-08-06 MED ORDER — APIXABAN 5 MG PO TABS
5.0000 mg | ORAL_TABLET | Freq: Two times a day (BID) | ORAL | Status: DC
  Administered 2023-08-07 – 2023-08-08 (×4): 5 mg via ORAL
  Filled 2023-08-06 (×4): qty 1

## 2023-08-06 MED ORDER — LACTATED RINGERS IV BOLUS (SEPSIS)
1000.0000 mL | Freq: Once | INTRAVENOUS | Status: AC
  Administered 2023-08-06: 1000 mL via INTRAVENOUS

## 2023-08-06 MED ORDER — FENTANYL CITRATE PF 50 MCG/ML IJ SOSY: 12.5000 ug | PREFILLED_SYRINGE | INTRAMUSCULAR | Status: DC | PRN

## 2023-08-06 MED ORDER — SODIUM CHLORIDE 0.9 % IV SOLN: INTRAVENOUS | Status: AC

## 2023-08-06 NOTE — Assessment & Plan Note (Signed)
-  SIRS criteria met with         Component Value Date/Time   WBC 9.6 08/06/2023 1713   LYMPHSABS 0.4 (L) 08/06/2023 1713     tachycardia   ,   fever   RR >20 Today's Vitals   08/06/23 2015 08/06/23 2030 08/06/23 2045 08/06/23 2100  BP: 133/76 134/76 132/74 128/73  Pulse: (!) 59 (!) 115 (!) 113 (!) 114  Resp: (!) 33 (!) 34 (!) 31 (!) 30  SpO2: 97% 96% 97% 97%  PainSc:          -Most likely source being:  urinary    Patient meeting criteria for Severe sepsis with    evidence of end organ damage/organ dysfunction such as    elevated lactic acid >2     Component Value Date/Time   LATICACIDVEN 2.7 (HH) 08/06/2023 2103    acute metabolic encephalopathy    Patient is meeting criteria for SEPTIC SHOCK with  lactic acid > 4    - Obtain serial lactic acid and procalcitonin level.  - Initiated IV antibiotics in ER: Antibiotics Given (last 72 hours)     Date/Time Action Medication Dose Rate   08/06/23 1736 New Bag/Given   ceFEPIme (MAXIPIME) 2 g in sodium chloride  0.9 % 100 mL IVPB 2 g 200 mL/hr       Will continue  on : Cefepime   - await results of blood and urine culture  - Rehydrate aggressively  Intravenous fluids were administered,          30cc/kg fluid    9:28 PM

## 2023-08-06 NOTE — Assessment & Plan Note (Signed)
-   most likely multifactorial secondary to combination of  infection   mild dehydration secondary to decreased by mouth intake,  polypharmacy   - Will rehydrate   - treat underlining infection   - Hold contributing medications   - if no improvement may need further imaging to evaluate for CNS pathology pathology such as MRI of the brain   - neurological exam appears to be nonfocal but patient unable to cooperate fully   - VBG unremarkable no evidence of hypercarbia    - no history of liver disease ammonia ordered

## 2023-08-06 NOTE — Assessment & Plan Note (Signed)
 Check CK prior to restarting Lipitor 

## 2023-08-06 NOTE — ED Notes (Signed)
 Istat Lactic 7.4, Dr. Drury Geralds notified.

## 2023-08-06 NOTE — ED Triage Notes (Signed)
 PT BIB EMS from home, was called out by wife, was concerned he had a stroke, he had a ground level mechanical fall last night, didn't go to the hospital, was evaluated by EMS at home did not want to go to the hospital, did not hit head, on Eliquis , was confused this morning, between 0900-1100, started vomiting later today.  Has had a foley catheter for about a month.   101.4 temp  186/94 HR 104 31 RR CO2 15 88% RA-4L [place] -98% 18 gauge left hand 1000 ml LR

## 2023-08-06 NOTE — Assessment & Plan Note (Signed)
 Continue Protonix  40 nightly

## 2023-08-06 NOTE — Assessment & Plan Note (Signed)
 Sliding scale hold metformin  patient recently was taken off of insulin  repeat hemoglobin A1c

## 2023-08-06 NOTE — Subjective & Objective (Signed)
 Presents with ground level mechanical fall last night, patient refused to go to the hospital denies that he hit his head patient is on Eliquis  this morning found to be more confused and started to have nausea and vomiting On arrival temp 101.4 blood pressure 184/94 heart rate 104 noted to be hypoxic 88% on room air required 4 L nasal cannula to improve O2 to 98% Lactic acid found to be 7.4 Patient history of diabetes GERD and   breast cancer

## 2023-08-06 NOTE — ED Notes (Signed)
 Wife at bedside.  Updated patient and wife about plan of care.

## 2023-08-06 NOTE — ED Provider Notes (Signed)
 [place] EMERGENCY DEPARTMENT AT [facility] Provider Note   CSN: [id number] Arrival date & time: 08/06/23  1701     History {Add pertinent medical, surgical, social history, OB history to HPI:1} No chief complaint on file.   John [name] is a 78 y.o. male with PMH as listed below who presents BIB EMS from home, was called out by wife, was concerned he had a stroke, he had a ground level mechanical fall last night, didn't go to the hospital, was evaluated by EMS at home did not want to go to the hospital, did not hit head, on Eliquis , was confused this morning, between 0900-1100, started vomiting later today.  Has had a foley catheter for about a month.    101.4 temp  186/94 HR 104 31 RR CO2 15 88% RA-4L [place] -98% 18 gauge left hand 1000 ml LR  Past Medical History:  Diagnosis Date   BPH (benign prostatic hyperplasia)    CTS (carpal tunnel syndrome)    Depression    Diabetes mellitus    Essential hypertension    GERD (gastroesophageal reflux disease)    History of breast cancer    Hypercholesteremia    Neuropathy        Home Medications Prior to Admission medications   Medication Sig Start Date End Date Taking? Authorizing Provider  acetaminophen  (TYLENOL ) 650 MG CR tablet Take 1 tablet (650 mg total) by mouth every 8 (eight) hours as needed for pain. 05/14/23   Zhao, Xika, NP  apixaban  (ELIQUIS ) 5 MG TABS tablet Take 1 tablet (5 mg total) by mouth 2 (two) times daily. 06/24/23   Jobe Mulder, DO  Ascorbic Acid  (VITAMIN C) 1000 MG tablet Take 1,000 mg by mouth 3 (three) times a week.    [provider]  atorvastatin  (LIPITOR ) 80 MG tablet Take 1 tablet (80 mg total) by mouth at bedtime. 06/24/23   Jobe Mulder, DO  Blood Glucose Monitoring Suppl (ACCU-CHEK GUIDE) w/Device KIT Use daily to check blood sugar.  DX E11.69 11/22/22   Jobe Mulder, DO  buPROPion  (WELLBUTRIN ) 75 MG tablet Take 1 tablet (75 mg total) by mouth 2  (two) times daily. 06/24/23   Jobe Mulder, DO  clotrimazole -betamethasone  (LOTRISONE ) cream Apply 1 Application topically daily. 07/11/23   Jobe Mulder, DO  Continuous Glucose Sensor (FREESTYLE LIBRE 3 SENSOR) MISC Place 1 sensor on the skin every 14 days. Use to check glucose continuously 06/15/23   Jobe Mulder, DO  empagliflozin  (JARDIANCE ) 25 MG TABS tablet Take 1 tablet (25 mg total) by mouth daily. 06/15/23   Jobe Mulder, DO  finasteride  (PROSCAR ) 5 MG tablet Take 1 tablet (5 mg total) by mouth daily. 05/04/23 12/27/24  Angiulli, Everlyn Hockey, PA-C  Lancets Misc. (ACCU-CHEK SOFTCLIX LANCET DEV) KIT Use daily to check blood sugar.  DX E11.69    [provider]  metFORMIN  (GLUCOPHAGE -XR) 500 MG 24 hr tablet Take 2 tablets (1,000 mg total) by mouth 2 (two) times daily with a meal. 07/26/23   Wendling, Shellie Dials, DO  Multiple Vitamin (MULTIVITAMIN) tablet Take 1 tablet by mouth 2 (two) times a week.    [provider]  pantoprazole  (PROTONIX ) 40 MG tablet Take 1 tablet (40 mg total) by mouth daily. (Take in place of omeprazole  while taking clopidogrel .) 06/24/23   Jobe Mulder, DO  polyethylene glycol (MIRALAX  / GLYCOLAX ) 17 g packet Take 17 g by mouth 2 (two) times daily. 04/22/23  Regalado, Belkys A, MD  promethazine  (PHENERGAN ) 25 MG tablet Take 0.5-1 tablets (12.5-25 mg total) by mouth every 8 (eight) hours as needed for nausea or vomiting. 07/26/23   Jobe Mulder, DO  tamsulosin  (FLOMAX ) 0.4 MG CAPS capsule Take 1 capsule (0.4 mg total) by mouth daily. 05/04/23 12/27/24  Angiulli, Daniel J, PA-C  Triamcinolone  Acetonide (TRIAMCINOLONE  0.1 % CREAM : EUCERIN) CREA Apply 1 Application topically 2 (two) times daily. 04/22/23   Regalado, Belkys A, MD  amLODipine  (NORVASC ) 2.5 MG tablet Take 1 tablet (2.5 mg total) by mouth daily. 06/15/23 06/30/23  Jobe Mulder, DO  escitalopram  (LEXAPRO ) 5 MG tablet Take 1 tablet (5 mg  total) by mouth daily. 06/15/23 06/30/23  Jobe Mulder, DO      Allergies    Ramipril, Other, Sertraline, and Adhesive [tape]    Review of Systems   Review of Systems A 10 point review of systems was performed and is negative unless otherwise reported in HPI.  Physical Exam Updated Vital Signs There were no vitals taken for this visit. Physical Exam General: Normal appearing {Desc; male/male:11659}, lying in bed.  HEENT: PERRLA, Sclera anicteric, MMM, trachea midline.  Cardiology: RRR, no murmurs/rubs/gallops. BL radial and DP pulses equal bilaterally.  Resp: Normal respiratory rate and effort. CTAB, no wheezes, rhonchi, crackles.  Abd: Soft, non-tender, non-distended. No rebound tenderness or guarding.  GU: Deferred. MSK: No peripheral edema or signs of trauma. Extremities without deformity or TTP. No cyanosis or clubbing. Skin: warm, dry. No rashes or lesions. Back: No CVA tenderness Neuro: A&Ox4, CNs II-XII grossly intact. MAEs. Sensation grossly intact.  Psych: Normal mood and affect.   ED Results / Procedures / Treatments   Labs (all labs ordered are listed, but only abnormal results are displayed) Labs Reviewed - No data to display  EKG None  Radiology No results found.  Procedures Procedures  {Document cardiac monitor, telemetry assessment procedure when appropriate:1}  Medications Ordered in ED Medications - No data to display  ED Course/ Medical Decision Making/ A&P                          Medical Decision Making   This patient presents to the ED for concern of ***, this involves an extensive number of treatment options, and is a complaint that carries with it a high risk of complications and morbidity.  I considered the following differential and admission for this acute, potentially life threatening condition.   MDM:    ***     Labs: I Ordered, and personally interpreted labs.  The pertinent results include:  ***  Imaging Studies  ordered: I ordered imaging studies including *** I independently visualized and interpreted imaging. I agree with the radiologist interpretation  Additional history obtained from ***.  External records from outside source obtained and reviewed including ***  Cardiac Monitoring: The patient was maintained on a cardiac monitor.  I personally viewed and interpreted the cardiac monitored which showed an underlying rhythm of: ***  Reevaluation: After the interventions noted above, I reevaluated the patient and found that they have :{resolved/improved/worsened:23923::"improved"}  Social Determinants of Health: ***  Disposition:  ***  Co morbidities that complicate the patient evaluation  Past Medical History:  Diagnosis Date   BPH (benign prostatic hyperplasia)    CTS (carpal tunnel syndrome)    Depression    Diabetes mellitus    Essential hypertension    GERD (gastroesophageal reflux disease)    History  of breast cancer    Hypercholesteremia    Neuropathy      Medicines No orders of the defined types were placed in this encounter.   I have reviewed the patients home medicines and have made adjustments as needed  Problem List / ED Course: Problem List Items Addressed This Visit   None        {Document critical care time when appropriate:1} {Document review of labs and clinical decision tools ie heart score, Chads2Vasc2 etc:1}  {Document your independent review of radiology images, and any outside records:1} {Document your discussion with family members, caretakers, and with consultants:1} {Document social determinants of health affecting pt's care:1} {Document your decision making why or why not admission, treatments were needed:1}  This note was created using dictation software, which may contain spelling or grammatical errors.

## 2023-08-06 NOTE — Progress Notes (Signed)
 Elink following for sepsis protocol.

## 2023-08-06 NOTE — Assessment & Plan Note (Signed)
 Restart Flomax  patient has indwelling Foley at this time

## 2023-08-06 NOTE — Assessment & Plan Note (Signed)
 Currently appears to be in sepsis will fluid resuscitate but be mindful of history of CHF

## 2023-08-06 NOTE — Assessment & Plan Note (Signed)
 Hold Neurontin  given mental status changes

## 2023-08-06 NOTE — H&P (Signed)
 Rogerio Clay ZOX:[id number] DOB: 1945-11-05 DOA: 08/06/2023     PCP: Jobe Mulder, DO   [facility]  CARDS:   Dr. Daneil Dunker   Urology Dr. Marlou Sims  Patient arrived to ER on 08/06/23 at 1701 Referred by Attending Merdis Stalling, MD   Patient coming from:    home Lives  With family    Chief Complaint:  FEVER   HPI: Jasman Calley is a 78 y.o. male with medical history significant of diabetes type 2, indwelling Foley catheter, GERD, BPH, hypertension hyperlipidemia, sp pacemaker, hx of CVA  , hx of breast cancer, hx of DVT/PE  Presented with   altered mental status Presents with ground level mechanical fall last night, patient refused to go to the hospital denies that he hit his head patient is on Eliquis  this morning found to be more confused and started to have nausea and vomiting On arrival temp 101.4 blood pressure 184/94 heart rate 104 noted to be hypoxic 88% on room air required 4 L nasal cannula to improve O2 to 98% Lactic acid found to be 7.4 Patient history of diabetes GERD and   breast cancer   Has not been well ever since he got out of rehab Has frequent n/V Wife reports his foley was placed initially in ER about 10 days ago Was seen by Urology  they replaced a catheter He was supposed to get a cystoscopy on May 23 wife wanted it to be done sooner   Wife reports he is dry heaving often Has not seen Gi yet but is going to   Denies significant ETOH intake   Does not smoke       Regarding pertinent Chronic problems:     Hyperlipidemia - on statins Lipitor  (atorvastatin )  Lipid Panel     Component Value Date/Time   CHOL 100 04/21/2023 0333   TRIG 120 04/21/2023 0333   HDL 22 (L) 04/21/2023 0333   CHOLHDL 4.5 04/21/2023 0333   VLDL 24 04/21/2023 0333   LDLCALC 54 04/21/2023 0333   LDLDIRECT 78.0 04/11/2023 1509    HTN ON NORVASC    chronic CHF diastolic  - last echo  Recent Results (from the past 81191 hours)  ECHOCARDIOGRAM  COMPLETE   Collection Time: 04/21/23  8:39 AM  Result Value   Weight 3,693.15   Height 73   BP 138/61   Single Plane A2C EF 65.8   Single Plane A4C EF 58.5   Calc EF 61.9   S' Lateral 2.90   AR max vel 2.85   AV Area VTI 2.96   AV Mean grad 3.0   AV Peak grad 4.4   Ao pk vel 1.05   Area-P 1/2 3.51   AV Area mean vel 2.59   MV VTI 3.06   Est EF 60 - 65%   Narrative      ECHOCARDIOGRAM REPORT        1. Left ventricular ejection fraction, by estimation, is 60 to 65%. Left ventricular ejection fraction by 2D MOD biplane is 61.9 %. The left ventricle has normal function. The left ventricle has no regional wall motion abnormalities. Left ventricular  diastolic parameters are consistent with Grade I diastolic dysfunction (impaired relaxation).  2. Right ventricular systolic function is normal. The right ventricular size is normal. There is normal pulmonary artery systolic pressure. The estimated right ventricular systolic pressure is 25.7 mmHg.  3. The mitral valve is grossly normal. No evidence of mitral valve regurgitation.  4.  The aortic valve is tricuspid. Aortic valve regurgitation is not visualized. Aortic valve sclerosis/calcification is present, without any evidence of aortic stenosis.            DM 2 -  Lab Results  Component Value Date   HGBA1C 12.4 (H) 04/11/2023   ON METFORMIN     Hx of DVT/PE on eliquis     current  on anticoagulation with Eliquis ,        BPH - now with indwelling foley catheter     Chronic anemia - baseline hg Hemoglobin & Hematocrit  Recent Labs    07/11/23 1640 08/06/23 1713 08/06/23 1738  HGB 12.3* 11.8* 11.9*  12.6*   Iron/TIBC/Ferritin/ %Sat    Component Value Date/Time   FERRITIN 500 (H) 10/28/2019 0546     While in ER: Clinical Course as of 08/06/23 2112  Sat Aug 06, 2023  1809 Lactic Acid, Venous(!!): 7.1 S/p 1L IVF with EMS, giving another L IVF [HN]  1809 Bicarbonate(!): 18.5 Compensated metabolic acidosis [HN]  1810  Blood Culture (routine x 2) +UTI [HN]  1810 WBC: 9.6 No leukocytosis, +left shift [HN]  1810 CT Head Wo Contrast NAICP on my read [HN]  1811 DG Chest Port 1 View No acute changes on my read [HN]  1813 Consulted for admission [HN]  1814 Prior urine culture E Coli from 07/13/23 senstive to cefepime [HN]  2052 D/w Dr. Hendrick Locke with medicine who requests a repeat lactic acid and a CT abd pelvis w contrast. Also requests an ICU consult. [HN]  2109 Lactic Acid, Venous(!!): 2.7 [HN]    Clinical Course User Index [HN] Merdis Stalling, MD     Lab Orders         Resp panel by RT-PCR (RSV, Flu A&B, Covid) Anterior Nasal Swab         Blood Culture (routine x 2)         Urine Culture         Comprehensive metabolic panel         CBC with Differential         Urinalysis, w/ Reflex to Culture (Infection Suspected) -Urine, Catheterized; Indwelling urinary catheter         Brain natriuretic peptide         Protime-INR         I-Stat Lactic Acid, ED         I-Stat venous blood gas, ED ([facility])         I-Stat Chem 8, ED         POC CBG, ED      CT HEAD   NON acute remote lacunar infarcts    CXR -   NON acute mild pulmonary congestion    Following Medications were ordered in ER: Medications  lactated ringers  bolus 1,000 mL (0 mLs Intravenous Stopped 08/06/23 1846)  ceFEPIme (MAXIPIME) 2 g in sodium chloride  0.9 % 100 mL IVPB (0 g Intravenous Stopped 08/06/23 1846)  ondansetron  (ZOFRAN ) injection 4 mg (4 mg Intravenous Given 08/06/23 1726)    _______________________________________________________ ER Provider Called:     PCCM  Dr. Mason Sole They Recommend admit to medicine        ED Triage Vitals  Encounter Vitals Group     BP 08/06/23 1719 (!) 180/73     Systolic BP Percentile --      Diastolic BP Percentile --      Pulse Rate 08/06/23 1719 (!) 102     Resp 08/06/23 1719 (!) 25  Temp --      Temp src --      SpO2 08/06/23 1719 97 %     Weight --      Height --      Head  Circumference --      Peak Flow --      Pain Score 08/06/23 1712 0     Pain Loc --      Pain Education --      Exclude from Growth Chart --   ZOXW(96)@     _________________________________________ Significant initial  Findings: Abnormal Labs Reviewed  COMPREHENSIVE METABOLIC PANEL WITH GFR - Abnormal; Notable for the following components:      Result Value   CO2 16 (*)    Glucose, Bld 190 (*)    Albumin 3.4 (*)    Total Bilirubin 1.9 (*)    Anion gap 19 (*)    All other components within normal limits  CBC WITH DIFFERENTIAL/PLATELET - Abnormal; Notable for the following components:   RBC 4.20 (*)    Hemoglobin 11.8 (*)    HCT 37.1 (*)    RDW 18.9 (*)    Neutro Abs 8.5 (*)    Lymphs Abs 0.4 (*)    All other components within normal limits  URINALYSIS, W/ REFLEX TO CULTURE (INFECTION SUSPECTED) - Abnormal; Notable for the following components:   APPearance CLOUDY (*)    Glucose, UA >=500 (*)    Hgb urine dipstick MODERATE (*)    Ketones, ur 5 (*)    Protein, ur 30 (*)    Leukocytes,Ua LARGE (*)    Bacteria, UA RARE (*)    All other components within normal limits  I-STAT CG4 LACTIC ACID, ED - Abnormal; Notable for the following components:   Lactic Acid, Venous 7.1 (*)    All other components within normal limits  I-STAT VENOUS BLOOD GAS, ED - Abnormal; Notable for the following components:   pCO2, Ven 29.0 (*)    pO2, Ven 66 (*)    Bicarbonate 18.5 (*)    TCO2 19 (*)    Acid-base deficit 5.0 (*)    Calcium , Ion 1.04 (*)    HCT 35.0 (*)    Hemoglobin 11.9 (*)    All other components within normal limits  I-STAT CHEM 8, ED - Abnormal; Notable for the following components:   Glucose, Bld 185 (*)    Calcium , Ion 1.04 (*)    TCO2 18 (*)    Hemoglobin 12.6 (*)    HCT 37.0 (*)    All other components within normal limits     Cardiac Panel (last 3 results) Recent Labs    08/06/23 2344  CKTOTAL 111     ECG: Ordered Personally reviewed and interpreted by me  showing: HR : 102 Rhythm:Sinus tachycardia RBBB and LPFB No significant change from prior QTC 450   ____________________ This patient meets SIRS Criteria and may be septic.    The recent clinical data is shown below. Vitals:   08/06/23 1904 08/06/23 1930 08/06/23 1945 08/06/23 2000  BP:  (!) 148/82 (!) 150/81 (!) 147/83  Pulse: (!) 118 (!) 124 (!) 120 (!) 121  Resp: (!) 26 (!) 30 (!) 37 (!) 32  SpO2: 96% 97% 96% 94%     WBC     Component Value Date/Time   WBC 9.6 08/06/2023 1713   LYMPHSABS 0.4 (L) 08/06/2023 1713   MONOABS 0.5 08/06/2023 1713   EOSABS 0.0 08/06/2023 1713   BASOSABS 0.0  08/06/2023 1713     Lactic Acid, Venous    Component Value Date/Time   LATICACIDVEN 7.1 (HH) 08/06/2023 1739     Lactic Acid, Venous    Component Value Date/Time   LATICACIDVEN 2.7 (HH) 08/06/2023 2103    Procalcitonin   Ordered      UA   evidence of UTI     Urine analysis:    Component Value Date/Time   COLORURINE YELLOW 08/06/2023 1713   APPEARANCEUR CLOUDY (A) 08/06/2023 1713   LABSPEC 1.015 08/06/2023 1713   PHURINE 5.0 08/06/2023 1713   GLUCOSEU >=500 (A) 08/06/2023 1713   HGBUR MODERATE (A) 08/06/2023 1713   BILIRUBINUR NEGATIVE 08/06/2023 1713   KETONESUR 5 (A) 08/06/2023 1713   PROTEINUR 30 (A) 08/06/2023 1713   NITRITE NEGATIVE 08/06/2023 1713   LEUKOCYTESUR LARGE (A) 08/06/2023 1713    Results for orders placed or performed during the hospital encounter of 08/06/23  Urine Culture     Status: None (Preliminary result)   Collection Time: 08/06/23  5:13 PM   Specimen: Urine, Random  Result Value Ref Range Status   Specimen Description URINE, RANDOM  Final   Special Requests   Final    URINE, CATHETERIZED Performed at [facility], [address] [place], [place] [address]    Culture PENDING  Incomplete   Report Status PENDING  Incomplete  Resp panel by RT-PCR (RSV, Flu A&B, Covid) Anterior Nasal Swab     Status: None   Collection Time: 08/06/23   5:53 PM   Specimen: Anterior Nasal Swab  Result Value Ref Range Status   SARS Coronavirus 2 by RT PCR NEGATIVE NEGATIVE Final   Influenza A by PCR NEGATIVE NEGATIVE Final   Influenza B by PCR NEGATIVE NEGATIVE Final         Resp Syncytial Virus by PCR NEGATIVE NEGATIVE Final          ABX started Antibiotics Given (last 72 hours)     Date/Time Action Medication Dose Rate   08/06/23 1736 New Bag/Given   ceFEPIme (MAXIPIME) 2 g in sodium chloride  0.9 % 100 mL IVPB 2 g 200 mL/hr        Susceptibility data from last 90 days. Collected Specimen Info Organism AMPICILLIN AMPICILLIN/SULBACTAM CEFAZOLIN  CEFEPIME CEFTRIAXONE Ciprofloxacin Gentamicin  Susc lslt Imipenem Nitrofurantoin Susc lslt Piperacillin + Tazobactam Trimethoprim/Sulfa  07/13/23 Urine, Random Escherichia coli  R  R  S  S  S  S  S  S  S  I  R     ________________________________________________________________   Venous  Blood Gas result:  pH  7.413 Sodium 137 mmol/L   pCO2, Ven 29.0 Low  mmHg Potassium 3.7 mmol/L  pO2, Ven 66 High  mmHg      ABG    Component Value Date/Time   HCO3 18.5 (L) 08/06/2023 1738   TCO2 19 (L) 08/06/2023 1738   TCO2 18 (L) 08/06/2023 1738   ACIDBASEDEF 5.0 (H) 08/06/2023 1738   O2SAT 93 08/06/2023 1738    __________________________________________________________ Recent Labs  Lab 08/06/23 1713 08/06/23 1738 08/06/23 2344  NA 136 137  137  --   K 3.8 3.7  3.7  --   CO2 16*  --   --   GLUCOSE 190* 185*  --   BUN 12 10  --   CREATININE 1.11 0.80  --   CALCIUM  9.0  --   --   MG  --   --  1.0*  PHOS  --   --  4.2    Cr  stable,  Lab Results  Component Value Date   CREATININE 0.80 08/06/2023   CREATININE 1.11 08/06/2023   CREATININE 1.10 07/13/2023    Recent Labs  Lab 08/06/23 1713  AST 29  ALT 17  ALKPHOS 65  BILITOT 1.9*  PROT 6.5  ALBUMIN 3.4*   Lab Results  Component Value Date   CALCIUM  9.0 08/06/2023   PHOS 2.7 05/11/2023        Plt: Lab Results   Component Value Date   PLT 252 08/06/2023     Recent Labs  Lab 08/06/23 1713 08/06/23 1738  WBC 9.6  --   NEUTROABS 8.5*  --   HGB 11.8* 11.9*  12.6*  HCT 37.1* 35.0*  37.0*  MCV 88.3  --   PLT 252  --     HG/HCT  stable,     Component Value Date/Time   HGB 11.9 (L) 08/06/2023 1738   HGB 12.6 (L) 08/06/2023 1738   HCT 35.0 (L) 08/06/2023 1738   HCT 37.0 (L) 08/06/2023 1738   MCV 88.3 08/06/2023 1713    _______________________________________________ Hospitalist was called for admission for   Urinary tract infection associated with indwelling urethral catheter,   Sepsis with encephalopathy    The following Work up has been ordered so far:  Orders Placed This Encounter  Procedures   Resp panel by RT-PCR (RSV, Flu A&B, Covid) Anterior Nasal Swab   Blood Culture (routine x 2)   Urine Culture   DG Chest Port 1 View   CT Head Wo Contrast   Comprehensive metabolic panel   CBC with Differential   Urinalysis, w/ Reflex to Culture (Infection Suspected) -Urine, Catheterized; Indwelling urinary catheter   Brain natriuretic peptide   Protime-INR   Diet NPO time specified   Document height and weight   Assess and Document Glasgow Coma Scale   Document vital signs within 1-hour of fluid bolus completion. Notify provider of abnormal vital signs despite fluid resuscitation.   DO NOT delay antibiotics if unable to obtain blood culture.   Refer to Sidebar Report: Sepsis Sidebar ED/IP   Notify provider for difficulties obtaining IV access.   Insert peripheral IV x 2   Initiate Carrier Fluid Protocol   Code Sepsis activation.  This occurs automatically when order is signed and prioritizes pharmacy, lab, and radiology services for STAT collections and interventions.  If [facility] downtime, call [facility] [phone]) to activate Code Sepsis.   monitoring by pharmacy   Consult for [facility] Admission   I-Stat Lactic Acid, ED   I-Stat venous blood gas, ED ([facility])   I-Stat  Chem 8, ED   POC CBG, ED   ED EKG   EKG 12-Lead     OTHER Significant initial  Findings:  labs showing:     DM  labs:  HbA1C: Recent Labs    04/11/23 1509  HGBA1C 12.4*       CBG (last 3)  No results for input(s): "GLUCAP" in the last 72 hours.        Cultures:    Component Value Date/Time   SDES URINE, RANDOM 08/06/2023 1713   SPECREQUEST  08/06/2023 1713    URINE, CATHETERIZED Performed at [facility], [address], [place], [place] [address]    CULT PENDING 08/06/2023 1713   REPTSTATUS PENDING 08/06/2023 1713     Radiological Exams on Admission: DG Chest Port 1 View Result Date: 08/06/2023 CLINICAL DATA:  Questionable sepsis.  Evaluate abnormality.  EXAM: PORTABLE CHEST 1 VIEW COMPARISON:  Two-view chest x-ray 05/14/2023 FINDINGS: The heart size is normal. Pacing wires are stable. Mild pulmonary vascular congestion is present. Linear atelectasis is present in the right midlung. The lungs are otherwise clear. The visualized soft tissues and bony thorax are unremarkable. IMPRESSION: 1. Mild pulmonary vascular congestion. 2. Linear atelectasis in the right midlung. Electronically Signed   By: Audree Leas M.D.   On: 08/06/2023 18:50   CT Head Wo Contrast Result Date: 08/06/2023 CLINICAL DATA:  Head trauma. Ground level fall last night. Patient's wife was concerned for a stroke last night. Patient woke up more confused today with vomiting. EXAM: CT HEAD WITHOUT CONTRAST TECHNIQUE: Contiguous axial images were obtained from the base of the skull through the vertex without intravenous contrast. RADIATION DOSE REDUCTION: This exam was performed according to the departmental dose-optimization program which includes automated exposure control, adjustment of the mA and/or kV according to patient size and/or use of iterative reconstruction technique. COMPARISON:  CT head without contrast 05/08/2023. MR head without contrast/26/25. FINDINGS: Brain: Remote lacunar  infarcts are again noted within the thalami bilaterally in the left caudate head. A remote lacunar infarct is Vascular: Acute dilation of the left lateral ventricle is noted. 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 No acute infarct, hemorrhage, or mass lesion is present. No significant extraaxial fluid collection is present. The brainstem and cerebellum are within normal limits. Midline structures are within normal limits. Skull: Calvarium is intact. No focal lytic or blastic lesions are present. No significant extracranial soft tissue lesion is present. Sinuses/Orbits: The paranasal sinuses and mastoid air cells are clear. Bilateral lens replacements are noted. Globes and orbits are otherwise unremarkable. IMPRESSION: 1. No acute intracranial abnormality or significant interval change. 2. 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 3. 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 Electronically Signed   By: Audree Leas M.D.   On: 08/06/2023 18:49   _______________________________________________________________________________________________________ Latest  Blood pressure (!) 147/83, pulse (!) 121, resp. rate (!) 32, SpO2 94%.   Vitals  labs and radiology finding personally reviewed  Review of Systems:    Pertinent positives include:  , Fevers, chills, fatigue,   abdominal pain, nausea, vomiting,  Constitutional:  No weight loss, night sweats weight loss  HEENT:  No headaches, Difficulty swallowing,Tooth/dental problems,Sore throat,  No sneezing, itching, ear ache, nasal congestion, post nasal drip,  Cardio-vascular:  No chest pain, Orthopnea, PND, anasarca, dizziness, palpitations.no Bilateral lower extremity swelling  GI:  No heartburn, indigestion,diarrhea, change in bowel habits, loss of appetite, melena, blood in stool, hematemesis Resp:  no  shortness of breath at rest. No dyspnea on exertion, No excess mucus, no productive cough, No non-productive cough, No coughing up of blood.No change in color of mucus.No wheezing. Skin:  no rash or lesions. No jaundice GU:  no dysuria, change in color of urine, no urgency or frequency. No straining to urinate.  No flank pain.  Musculoskeletal:  No joint pain or no joint swelling. No decreased range of motion. No back pain.  Psych:  No change in mood or affect. No depression or anxiety. No memory loss.  Neuro: no localizing neurological complaints, no tingling, no weakness, no double vision, no gait abnormality, no slurred speech, no confusion  All systems reviewed and apart from HOPI all are negative _______________________________________________________________________________________________ Past Medical History:   Past Medical History:  Diagnosis Date   BPH (  benign prostatic hyperplasia)    CTS (carpal tunnel syndrome)    Depression    Diabetes mellitus    Essential hypertension    GERD (gastroesophageal reflux disease)    History of breast cancer    Hypercholesteremia    Neuropathy       Past Surgical History:  Procedure Laterality Date   CATARACT EXTRACTION, BILATERAL  01/18/2017   LITHOTRIPSY     MASTECTOMY Right 05/02/2018   PACEMAKER IMPLANT N/A 05/13/2023   Procedure: PACEMAKER IMPLANT;  Surgeon: Ardeen Kohler, MD;  Location: [facility] INVASIVE CV LAB;  Service: Cardiovascular;  Laterality: N/A;   TEMPORARY PACEMAKER N/A 05/11/2023   Procedure: TEMPORARY PACEMAKER;  Surgeon: [place], Peter M, MD;  Location: [facility] INVASIVE CV LAB;  Service: Cardiovascular;  Laterality: N/A;   TONSILLECTOMY AND ADENOIDECTOMY      Social History:  Ambulatory  cane, walker       reports that he has never smoked. He has never used smokeless tobacco. He reports that he does not drink alcohol and does not use drugs.   Family History:   Family History  Problem Relation Age of Onset    Lymphoma Mother    Cancer Mother    Diabetes Father    Stroke Father    Ovarian cancer Sister    Cancer Sister    Cancer Paternal Grandmother    ______________________________________________________________________________________________ Allergies: Allergies  Allergen Reactions   Ramipril Anaphylaxis   Other Diarrhea    Severe intolerance to Chemotherapy in the past.   Sertraline Other (See Comments)    "Extreme headaches"   Adhesive [Tape] Rash     Prior to Admission medications   Medication Sig Start Date End Date Taking? Authorizing Provider  acetaminophen  (TYLENOL ) 650 MG CR tablet Take 1 tablet (650 mg total) by mouth every 8 (eight) hours as needed for pain. 05/14/23   Zhao, Xika, NP  apixaban  (ELIQUIS ) 5 MG TABS tablet Take 1 tablet (5 mg total) by mouth 2 (two) times daily. 06/24/23   Jobe Mulder, DO  Ascorbic Acid  (VITAMIN C) 1000 MG tablet Take 1,000 mg by mouth 3 (three) times a week.    [provider]  atorvastatin  (LIPITOR ) 80 MG tablet Take 1 tablet (80 mg total) by mouth at bedtime. 06/24/23   Jobe Mulder, DO  Blood Glucose Monitoring Suppl (ACCU-CHEK GUIDE) w/Device KIT Use daily to check blood sugar.  DX E11.69 11/22/22   Jobe Mulder, DO  buPROPion  (WELLBUTRIN ) 75 MG tablet Take 1 tablet (75 mg total) by mouth 2 (two) times daily. 06/24/23   Jobe Mulder, DO  clotrimazole -betamethasone  (LOTRISONE ) cream Apply 1 Application topically daily. 07/11/23   Jobe Mulder, DO  Continuous Glucose Sensor (FREESTYLE LIBRE 3 SENSOR) MISC Place 1 sensor on the skin every 14 days. Use to check glucose continuously 06/15/23   Jobe Mulder, DO  empagliflozin  (JARDIANCE ) 25 MG TABS tablet Take 1 tablet (25 mg total) by mouth daily. 06/15/23   Jobe Mulder, DO  finasteride  (PROSCAR ) 5 MG tablet Take 1 tablet (5 mg total) by mouth daily. 05/04/23 12/27/24  Angiulli, Everlyn Hockey, PA-C  Lancets Misc. (ACCU-CHEK  SOFTCLIX LANCET DEV) KIT Use daily to check blood sugar.  DX E11.69    [provider]  metFORMIN  (GLUCOPHAGE -XR) 500 MG 24 hr tablet Take 2 tablets (1,000 mg total) by mouth 2 (two) times daily with a meal. 07/26/23   Wendling, Shellie Dials, DO  Multiple Vitamin (MULTIVITAMIN) tablet Take 1 tablet by  mouth 2 (two) times a week.    [provider]  pantoprazole  (PROTONIX ) 40 MG tablet Take 1 tablet (40 mg total) by mouth daily. (Take in place of omeprazole  while taking clopidogrel .) 06/24/23   Gwenette Lennox, Shellie Dials, DO  polyethylene glycol (MIRALAX  / GLYCOLAX ) 17 g packet Take 17 g by mouth 2 (two) times daily. 04/22/23   Regalado, Belkys A, MD  promethazine  (PHENERGAN ) 25 MG tablet Take 0.5-1 tablets (12.5-25 mg total) by mouth every 8 (eight) hours as needed for nausea or vomiting. 07/26/23   Jobe Mulder, DO  tamsulosin  (FLOMAX ) 0.4 MG CAPS capsule Take 1 capsule (0.4 mg total) by mouth daily. 05/04/23 12/27/24  Angiulli, Daniel J, PA-C  Triamcinolone  Acetonide (TRIAMCINOLONE  0.1 % CREAM : EUCERIN) CREA Apply 1 Application topically 2 (two) times daily. 04/22/23   Regalado, Belkys A, MD  amLODipine  (NORVASC ) 2.5 MG tablet Take 1 tablet (2.5 mg total) by mouth daily. 06/15/23 06/30/23  Jobe Mulder, DO  escitalopram  (LEXAPRO ) 5 MG tablet Take 1 tablet (5 mg total) by mouth daily. 06/15/23 06/30/23  Jobe Mulder, DO    ___________________________________________________________________________________________________ Physical Exam:    08/06/2023    8:00 PM 08/06/2023    7:45 PM 08/06/2023    7:30 PM  Vitals with BMI  Systolic 147 150 562  Diastolic 83 81 82  Pulse 121 120 124     1. General:  in No  Acute distress   Chronically ill   -appearing 2. Psychological: Alert and   Oriented 3. Head/ENT:    Dry Mucous Membranes                          Head Non traumatic, neck supple                          Poor Dentition 4. SKIN:  decreased Skin  turgor,  Skin clean Dry and intact no rash    5. Heart: Regular rate and rhythm no  Murmur, no Rub or gallop 6. Lungs:   no wheezes or crackles   7. Abdomen: Soft,  non-tender, Non distended   obese  bowel sounds present 8. Lower extremities: no clubbing, cyanosis, 1+ edema 9. Neurologically Grossly intact, moving all 4 extremities equally  10. MSK: Normal range of motion    Chart has been reviewed  ______________________________________________________________________________________________  Assessment/Plan a 78 y.o. male with medical history significant of diabetes type 2, indwelling Foley catheter, GERD, BPH, hypertension hyperlipidemia    Admitted for UTI, Sepsis    Present on Admission:  Septic shock (HCC)  BPH (benign prostatic hyperplasia)  GERD (gastroesophageal reflux disease)  Essential hypertension  Hyperlipidemia  Chronic diastolic CHF (congestive heart failure) (HCC)  Bacteriuria  Acute encephalopathy  Cognitive impairment  Diabetic polyneuropathy associated with type 2 diabetes mellitus (HCC)  Heart block AV complete (HCC)  Hypomagnesemia  Gallstones  Lactic acidosis  Acute urinary retention  Sacral pressure ulcer     Insulin  dependent type 2 diabetes mellitus (HCC) Sliding scale hold metformin  patient recently was taken off of insulin  repeat hemoglobin A1c  BPH (benign prostatic hyperplasia) Restart Flomax  patient has indwelling Foley at this time  GERD (gastroesophageal reflux disease) Continue Protonix  40 nightly  Essential hypertension Allow permissive hypertension  Hyperlipidemia Check CK prior to restarting Lipitor   Peripheral neuropathy Hold Neurontin  given mental status changes  Septic shock (HCC)  -SIRS criteria met with  Component Value Date/Time   WBC 9.6 08/06/2023 1713   LYMPHSABS 0.4 (L) 08/06/2023 1713     tachycardia   ,   fever   RR >20 Today's Vitals   08/06/23 2015 08/06/23 2030 08/06/23 2045 08/06/23 2100   BP: 133/76 134/76 132/74 128/73  Pulse: (!) 59 (!) 115 (!) 113 (!) 114  Resp: (!) 33 (!) 34 (!) 31 (!) 30  SpO2: 97% 96% 97% 97%  PainSc:          -Most likely source being:  urinary    Patient meeting criteria for Severe sepsis with    evidence of end organ damage/organ dysfunction such as    elevated lactic acid >2     Component Value Date/Time   LATICACIDVEN 2.7 (HH) 08/06/2023 2103    acute metabolic encephalopathy    Patient is meeting criteria for SEPTIC SHOCK with  lactic acid > 4    - Obtain serial lactic acid and procalcitonin level.  - Initiated IV antibiotics in ER: Antibiotics Given (last 72 hours)     Date/Time Action Medication Dose Rate   08/06/23 1736 New Bag/Given   ceFEPIme (MAXIPIME) 2 g in sodium chloride  0.9 % 100 mL IVPB 2 g 200 mL/hr       Will continue  on : Cefepime   - await results of blood and urine culture  - Rehydrate aggressively  Intravenous fluids were administered,          30cc/kg fluid    9:28 PM   Chronic diastolic CHF (congestive heart failure) (HCC) Currently appears to be in sepsis will fluid resuscitate but be mindful of history of CHF  Bacteriuria Patient with indwelling catheter presents with nausea vomiting altered mental status.  UA showing white and red blood cells as well as some moderate bacteria.  Urine culture pending for tonight, cefepime.  Monitor for any other source of infection.  CT abdomen pending  Acute encephalopathy   - most likely multifactorial secondary to combination of  infection   mild dehydration secondary to decreased by mouth intake,  polypharmacy   - Will rehydrate   - treat underlining infection   - Hold contributing medications   - if no improvement may need further imaging to evaluate for CNS pathology pathology such as MRI of the brain   - neurological exam appears to be nonfocal but patient unable to cooperate fully   - VBG unremarkable no evidence of hypercarbia    - no history of  liver disease ammonia ordered Patient does have a pacemaker in place, if not improved and need MRI in AM will need ot be aware of the pacemaker presence     Fall at home, initial encounter Will benefit from PT/OT prior to DC  Class 1 obesity due to excess calories without serious comorbidity with body mass index (BMI) of 31.0 to 31.9 in adult Contributing to comorbidity and complicating medical management  Body mass index is 27.94 kg/m.  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  Type 2 diabetes mellitus with hyperglycemia, with long-term current use of insulin  (HCC) Order sliding scale hold p.o. med metformin   Gallstones CT scan showing gallstones but no  evidence of acute cholecystitis. Patient comes in with sepsis suspect source being most likely urinary but given abnormal CT will obtain right upper quadrant ultrasound.  Patient does not endorse epigastric pain LFTs within normal limits this is reassuring currently on cefepime if there is any evidence of cholecystitis would add Flagyl but clinically doubt at this time  Personal history of DVT (deep vein thrombosis) Restart Eliquis   Lactic acidosis In the setting of dehydration poor p.o. intake, metformin , sepsis, and hepatic steatosis likely contributes to poor clearing.  Currently improving with IV fluid rehydration.  Acute urinary retention Patient's status post Foley catheter.  Has been seen by urology as an outpatient family would like to have urology follow-up closely. Restart Flomax  would defer to urology if patient could tolerate a trial of taking out Foley Recommend urology  consult discussion in a.m.  Sacral pressure ulcer Wound care consult    Other plan as per orders.  DVT prophylaxis:  Eliquis     Code Status:    Code Status: Full Code  as per patient   I had personally discussed CODE STATUS with patient and family wife stated in the past he have wanted to be DO NOT RESUSCITATE but at this time he wishes to be full code May benefit from goals of care discussion once patient closer to baseline ACP   none   Family Communication:   Family  at  Bedside  plan of care was discussed  with Wife,    Diet  Diet Orders (From admission, onward)     Start     Ordered   08/06/23 1713  Diet NPO time specified  (Septic presentation on arrival (screening labs, nursing and treatment orders for obvious sepsis))  Diet effective now        08/06/23 1713            Disposition Plan:        To home once workup is complete and patient is stable   Following barriers for discharge:                                                        Electrolytes corrected                               Anemia  stable                             Pain controlled with PO medications                               Afebrile, white count improving able to transition to PO antibiotics                             Will need to be able to tolerate PO                                                        Will need  consultants to evaluate patient prior to discharge                                               Would benefit from PT/OT eval prior to DC  Ordered                   Swallow eval - SLP ordered                   Diabetes care coordinator                   Transition of care consulted                   Nutrition    consulted                Consults called:  PCCM were made aware   Admission status:  ED Disposition     ED Disposition  Admit   Condition  --   Comment  Hospital Area: [facility] [[id number]]  Level of Care: Progressive [102]  Admit to  Progressive based on following criteria: MULTISYSTEM THREATS such as stable sepsis, metabolic/electrolyte imbalance with or without encephalopathy that is responding to early treatment.  May admit patient to [facility] or [facility] if equivalent level of care is available:: No  Covid Evaluation: Asymptomatic - no recent exposure (last 10 days) testing not required  Diagnosis: Sepsis [facility]) [[id number]]  Admitting Physician: Jobanny Mavis [[id number]]  Attending Physician: Givanni Staron [[id number]]  Certification:: I certify this patient will need inpatient services for at least 2 midnights  Expected Medical Readiness: 08/08/2023             inpatient     I Expect 2 midnight stay secondary to severity of patient's current illness need for inpatient interventions justified by the following:  hemodynamic instability despite optimal treatment (tachycardia  tachypnea )   Severe lab/radiological/exam abnormalities including:   UTI. sepsis   with encephalopathy     and extensive comorbidities including: *substance abuse     DM2    CHF   Chronic anticoagulation  That are currently affecting medical management.   I expect  patient to be hospitalized for 2 midnights requiring inpatient medical care.  Patient is at high risk for adverse outcome (such as loss of life or disability) if not treated.  Indication for inpatient stay as follows:  Severe change from baseline regarding mental status Hemodynamic instability despite maximal medical therapy,     inability to maintain oral hydration     Need for IV antibiotics, IV fluids,     Level of care   progressive       tele indefinitely please discontinue once patient no longer qualifies COVID-19 Labs   Verenice Westrich 08/07/2023, 12:53 AM    [facility]     after 2 AM please page floor coverage   If 7AM-7PM, please contact the day team taking care of the patient using [email or web address]

## 2023-08-06 NOTE — Assessment & Plan Note (Signed)
 Allow permissive hypertension

## 2023-08-06 NOTE — ED Notes (Signed)
 Patient currently in CT

## 2023-08-06 NOTE — Assessment & Plan Note (Signed)
 Patient with indwelling catheter presents with nausea vomiting altered mental status.  UA showing white and red blood cells as well as some moderate bacteria.  Urine culture pending for tonight, cefepime.  Monitor for any other source of infection.  CT abdomen pending

## 2023-08-07 ENCOUNTER — Inpatient Hospital Stay (HOSPITAL_COMMUNITY)

## 2023-08-07 DIAGNOSIS — E872 Acidosis, unspecified: Secondary | ICD-10-CM | POA: Insufficient documentation

## 2023-08-07 DIAGNOSIS — Z86718 Personal history of other venous thrombosis and embolism: Secondary | ICD-10-CM

## 2023-08-07 DIAGNOSIS — R6520 Severe sepsis without septic shock: Secondary | ICD-10-CM | POA: Diagnosis not present

## 2023-08-07 DIAGNOSIS — D509 Iron deficiency anemia, unspecified: Secondary | ICD-10-CM | POA: Insufficient documentation

## 2023-08-07 DIAGNOSIS — R338 Other retention of urine: Secondary | ICD-10-CM

## 2023-08-07 DIAGNOSIS — R7881 Bacteremia: Secondary | ICD-10-CM

## 2023-08-07 DIAGNOSIS — L89159 Pressure ulcer of sacral region, unspecified stage: Secondary | ICD-10-CM | POA: Diagnosis present

## 2023-08-07 DIAGNOSIS — B9620 Unspecified Escherichia coli [E. coli] as the cause of diseases classified elsewhere: Secondary | ICD-10-CM

## 2023-08-07 DIAGNOSIS — G9341 Metabolic encephalopathy: Secondary | ICD-10-CM | POA: Diagnosis not present

## 2023-08-07 DIAGNOSIS — E538 Deficiency of other specified B group vitamins: Secondary | ICD-10-CM | POA: Insufficient documentation

## 2023-08-07 DIAGNOSIS — I8290 Acute embolism and thrombosis of unspecified vein: Secondary | ICD-10-CM | POA: Insufficient documentation

## 2023-08-07 DIAGNOSIS — K8020 Calculus of gallbladder without cholecystitis without obstruction: Secondary | ICD-10-CM | POA: Diagnosis present

## 2023-08-07 DIAGNOSIS — B955 Unspecified streptococcus as the cause of diseases classified elsewhere: Secondary | ICD-10-CM

## 2023-08-07 LAB — CBC
HCT: 29.8 % — ABNORMAL LOW (ref 39.0–52.0)
Hemoglobin: 9.7 g/dL — ABNORMAL LOW (ref 13.0–17.0)
MCH: 28 pg (ref 26.0–34.0)
MCHC: 32.6 g/dL (ref 30.0–36.0)
MCV: 85.9 fL (ref 80.0–100.0)
Platelets: 233 K/uL (ref 150–400)
RBC: 3.47 MIL/uL — ABNORMAL LOW (ref 4.22–5.81)
RDW: 18.8 % — ABNORMAL HIGH (ref 11.5–15.5)
WBC: 13.3 K/uL — ABNORMAL HIGH (ref 4.0–10.5)
nRBC: 0 % (ref 0.0–0.2)

## 2023-08-07 LAB — FERRITIN: Ferritin: 26 ng/mL (ref 24–336)

## 2023-08-07 LAB — ETHANOL: Alcohol, Ethyl (B): <15 mg/dL

## 2023-08-07 LAB — IRON AND TIBC
Iron: 12 ug/dL — ABNORMAL LOW (ref 45–182)
Saturation Ratios: 5 % — ABNORMAL LOW (ref 17.9–39.5)
TIBC: 267 ug/dL (ref 250–450)
UIBC: 255 ug/dL

## 2023-08-07 LAB — BLOOD CULTURE ID PANEL (REFLEXED) - BCID2

## 2023-08-07 LAB — COMPREHENSIVE METABOLIC PANEL WITH GFR
ALT: 16 U/L (ref 0–44)
AST: 24 U/L (ref 15–41)
Albumin: 2.6 g/dL — ABNORMAL LOW (ref 3.5–5.0)
Alkaline Phosphatase: 49 U/L (ref 38–126)
Anion gap: 17 — ABNORMAL HIGH (ref 5–15)
BUN: 12 mg/dL (ref 8–23)
CO2: 19 mmol/L — ABNORMAL LOW (ref 22–32)
Calcium: 8.2 mg/dL — ABNORMAL LOW (ref 8.9–10.3)
Chloride: 101 mmol/L (ref 98–111)
Creatinine, Ser: 1.02 mg/dL (ref 0.61–1.24)
GFR, Estimated: >60 mL/min (ref >60)
Glucose, Bld: 186 mg/dL — ABNORMAL HIGH (ref 70–99)
Potassium: 3.7 mmol/L (ref 3.5–5.1)
Sodium: 137 mmol/L (ref 135–145)
Total Bilirubin: 1.8 mg/dL — ABNORMAL HIGH (ref 0.0–1.2)
Total Protein: 5.4 g/dL — ABNORMAL LOW (ref 6.5–8.1)

## 2023-08-07 LAB — RAPID URINE DRUG SCREEN, HOSP PERFORMED
Amphetamines: NOT DETECTED
Barbiturates: NOT DETECTED
Benzodiazepines: NOT DETECTED
Cocaine: NOT DETECTED
Opiates: NOT DETECTED
Tetrahydrocannabinol: NOT DETECTED

## 2023-08-07 LAB — LACTIC ACID, PLASMA
Lactic Acid, Venous: 1.3 mmol/L (ref 0.5–1.9)
Lactic Acid, Venous: 2.1 mmol/L (ref 0.5–1.9)

## 2023-08-07 LAB — GLUCOSE, CAPILLARY
Glucose-Capillary: 171 mg/dL — ABNORMAL HIGH (ref 70–99)
Glucose-Capillary: 161 mg/dL — ABNORMAL HIGH (ref 70–99)
Glucose-Capillary: 148 mg/dL — ABNORMAL HIGH (ref 70–99)
Glucose-Capillary: 152 mg/dL — ABNORMAL HIGH (ref 70–99)
Glucose-Capillary: 202 mg/dL — ABNORMAL HIGH (ref 70–99)
Glucose-Capillary: 178 mg/dL — ABNORMAL HIGH (ref 70–99)

## 2023-08-07 LAB — PREALBUMIN: Prealbumin: 10 mg/dL — ABNORMAL LOW (ref 18–38)

## 2023-08-07 LAB — PROCALCITONIN: Procalcitonin: 9.53 ng/mL

## 2023-08-07 LAB — FOLATE: Folate: 16 ng/mL (ref >5.9)

## 2023-08-07 LAB — MAGNESIUM
Magnesium: 1 mg/dL — ABNORMAL LOW (ref 1.7–2.4)
Magnesium: 2.2 mg/dL (ref 1.7–2.4)

## 2023-08-07 LAB — PROTIME-INR
INR: 1.4 — ABNORMAL HIGH (ref 0.8–1.2)
Prothrombin Time: 17.4 s — ABNORMAL HIGH (ref 11.4–15.2)

## 2023-08-07 LAB — PHOSPHORUS
Phosphorus: 4.2 mg/dL (ref 2.5–4.6)
Phosphorus: 4.4 mg/dL (ref 2.5–4.6)

## 2023-08-07 LAB — CK: Total CK: 111 U/L (ref 49–397)

## 2023-08-07 LAB — VITAMIN B12: Vitamin B-12: 226 pg/mL (ref 180–914)

## 2023-08-07 LAB — AMMONIA: Ammonia: 20 umol/L (ref 9–35)

## 2023-08-07 MED ORDER — SODIUM CHLORIDE 0.9 % IV SOLN
2.0000 g | INTRAVENOUS | Status: DC
  Administered 2023-08-07 – 2023-08-09 (×3): 2 g via INTRAVENOUS
  Filled 2023-08-07 (×3): qty 20

## 2023-08-07 MED ORDER — SODIUM CHLORIDE 0.9 % IV SOLN: INTRAVENOUS | Status: DC

## 2023-08-07 MED ORDER — FINASTERIDE 5 MG PO TABS
5.0000 mg | ORAL_TABLET | Freq: Every day | ORAL | Status: DC
  Administered 2023-08-07 – 2023-08-13 (×7): 5 mg via ORAL
  Filled 2023-08-07 (×7): qty 1

## 2023-08-07 MED ORDER — TRAZODONE HCL 50 MG PO TABS
25.0000 mg | ORAL_TABLET | Freq: Every evening | ORAL | Status: DC | PRN
  Administered 2023-08-07 – 2023-08-11 (×4): 25 mg via ORAL
  Filled 2023-08-07 (×5): qty 1

## 2023-08-07 MED ORDER — FERROUS SULFATE 325 (65 FE) MG PO TABS
325.0000 mg | ORAL_TABLET | Freq: Every day | ORAL | Status: DC
  Administered 2023-08-07 – 2023-08-13 (×5): 325 mg via ORAL
  Filled 2023-08-07 (×5): qty 1

## 2023-08-07 MED ORDER — ATORVASTATIN CALCIUM 80 MG PO TABS
80.0000 mg | ORAL_TABLET | Freq: Every day | ORAL | Status: DC
  Administered 2023-08-07 – 2023-08-12 (×6): 80 mg via ORAL
  Filled 2023-08-07 (×6): qty 1

## 2023-08-07 MED ORDER — VITAMIN B-12 1000 MCG PO TABS
1000.0000 ug | ORAL_TABLET | Freq: Every day | ORAL | Status: DC
  Administered 2023-08-07 – 2023-08-13 (×7): 1000 ug via ORAL
  Filled 2023-08-07 (×7): qty 1

## 2023-08-07 MED ORDER — INSULIN ASPART 100 UNIT/ML IJ SOLN
0.0000 [IU] | Freq: Three times a day (TID) | INTRAMUSCULAR | Status: DC
  Administered 2023-08-07 – 2023-08-08 (×4): 2 [IU] via SUBCUTANEOUS
  Administered 2023-08-08: 1 [IU] via SUBCUTANEOUS
  Administered 2023-08-08: 2 [IU] via SUBCUTANEOUS
  Administered 2023-08-09: 5 [IU] via SUBCUTANEOUS
  Administered 2023-08-09: 3 [IU] via SUBCUTANEOUS
  Administered 2023-08-09 (×2): 1 [IU] via SUBCUTANEOUS
  Administered 2023-08-10: 5 [IU] via SUBCUTANEOUS
  Administered 2023-08-10 (×3): 3 [IU] via SUBCUTANEOUS
  Administered 2023-08-11: 2 [IU] via SUBCUTANEOUS
  Administered 2023-08-11: 5 [IU] via SUBCUTANEOUS
  Administered 2023-08-11: 7 [IU] via SUBCUTANEOUS
  Administered 2023-08-11: 2 [IU] via SUBCUTANEOUS
  Administered 2023-08-12: 3 [IU] via SUBCUTANEOUS
  Administered 2023-08-12: 2 [IU] via SUBCUTANEOUS
  Administered 2023-08-12 (×2): 5 [IU] via SUBCUTANEOUS
  Administered 2023-08-13: 2 [IU] via SUBCUTANEOUS
  Administered 2023-08-13: 7 [IU] via SUBCUTANEOUS

## 2023-08-07 MED ORDER — MAGNESIUM SULFATE 2 GM/50ML IV SOLN
2.0000 g | Freq: Once | INTRAVENOUS | Status: AC
  Administered 2023-08-07: 2 g via INTRAVENOUS
  Filled 2023-08-07: qty 50

## 2023-08-07 NOTE — Consult Note (Signed)
 I have been asked to see the patient by Dr. Faith Homes for evaluation and management of UTI in setting of urinary retention.  History of present illness: 78 year old man with multiple medical problems including pacemaker, DM, HTN, DVT/PE and recent CVA who developed acute urinary retention on 07/13/2023.  An indwelling Foley catheter was placed and 1500 cc drained.  He was seen by PA Giovanna Lake at [facility] urology on 07/21/23 at which time he failed a voiding trial.  Foley catheter was replaced and a urodynamic study was scheduled as well as follow-up with Dr. Cathi Cluster.  Patient came back to the ED yesterday with ground-level fall at home.  CT abdomen pelvis showed distended gallbladder and imaging findings consistent with cholecystitis.  Urine culture shows prelim gram-negative rods.  Urology was called for UTI in setting of indwelling Foley catheter.  Patient reports low pain tolerance and does poorly with catheter changes.   ROS: +nausea/emesis  Patient Active Problem List   Diagnosis Date Noted   Cholelithiasis 08/07/2023   Personal history of DVT (deep vein thrombosis) 08/07/2023   Lactic acidosis 08/07/2023   Sacral pressure ulcer 08/07/2023   E coli bacteremia 08/07/2023   B12 deficiency 08/07/2023   Iron deficiency anemia 08/07/2023   Severe sepsis (HCC) 08/06/2023   Chronic diastolic CHF (congestive heart failure) (HCC) 08/06/2023   Bacteriuria 08/06/2023   Essential hypertension 06/15/2023   Situational depression 06/15/2023   Protein-calorie malnutrition, severe 05/12/2023   Heart block AV complete (HCC) 05/12/2023   Syncope 05/10/2023   Fall at home, initial encounter 05/08/2023   Hypotension 05/08/2023   Abnormal LFTs 05/08/2023   Anemia 05/08/2023   Coping style affecting medical condition 05/02/2023   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) 04/28/2023   Brainstem infarct, acute (HCC) 04/25/2023   Pressure injury of skin 04/25/2023   Right  middle cerebral artery stroke (HCC) 04/22/2023   Acute kidney injury 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) 04/17/2023   DKA (diabetic ketoacidosis) (HCC) 04/16/2023   Type 2 diabetes mellitus with hyperglycemia, with long-term current use of insulin  (HCC) 07/26/2022   Bronchitis 03/20/2021   Acute non-recurrent frontal sinusitis 03/20/2021   Viral URI with cough 01/13/2021   Hyperlipidemia 11/21/2020   Dizziness and giddiness 10/20/2020   Localized swelling of both lower extremities 10/20/2020   Tendinitis of right hip flexor 10/20/2020   Aortic atherosclerosis (HCC) 09/10/2020   Insomnia 09/10/2020   Cognitive impairment 08/18/2020   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   Acute urinary retention 06/04/2020   History of COVID-19 12/10/2019   Physical deconditioning 12/10/2019   Fatigue 12/10/2019   Unsteady gait 12/10/2019   Pneumonia due to COVID-19 virus 10/26/2019   Peripheral neuropathy 10/17/2019   Depression, major, single episode, 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/2019   History of breast cancer    GERD (gastroesophageal reflux disease)    BPH (benign prostatic hyperplasia)    CTS (carpal tunnel syndrome)    Diabetic polyneuropathy associated with type 2 diabetes mellitus (HCC) 09/29/2019   Neuropathic pain of foot 09/29/2019   Port-A-Cath in place 01/03/2019   Peripheral sensory neuropathy 05/17/2018   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   Hypomagnesemia 01/18/2018   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) 12/13/2017   Axillary adenopathy 12/09/2017   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   S/P cataract extraction and insertion of intraocular lens 01/17/2017   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   PVD (posterior vitreous  detachment), left 01/07/2017   Hyperopia of both eyes with astigmatism 11/09/2016   Class 1 obesity due to excess calories without serious comorbidity with body mass  index (BMI) of 31.0 to 31.9 in adult 09/17/2016   Encounter for monitoring tamoxifen  therapy 03/05/2016   Insulin  dependent type 2 diabetes mellitus (HCC) 07/02/2015   Diabetes 1.5, 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   Current Outpatient Medications on File Prior to Encounter  Medication Sig Dispense Refill   acetaminophen  (TYLENOL ) 650 MG CR tablet Take 1 tablet (650 mg total) by mouth every 8 (eight) hours as needed for pain.     apixaban  (ELIQUIS ) 5 MG TABS tablet Take 1 tablet (5 mg total) by mouth 2 (two) times daily. 180 tablet 1   Ascorbic Acid  (VITAMIN C) 1000 MG tablet Take 1,000 mg by mouth 3 (three) times a week.     atorvastatin  (LIPITOR ) 80 MG tablet Take 1 tablet (80 mg total) by mouth at bedtime. 90 tablet 3   buPROPion  (WELLBUTRIN ) 75 MG tablet Take 1 tablet (75 mg total) by mouth 2 (two) times daily. 180 tablet 1   clotrimazole -betamethasone  (LOTRISONE ) cream Apply 1 Application topically daily. 30 g 0   empagliflozin  (JARDIANCE ) 25 MG TABS tablet Take 1 tablet (25 mg total) by mouth daily. 30 tablet 2   finasteride  (PROSCAR ) 5 MG tablet Take 1 tablet (5 mg total) by mouth daily. 30 tablet 0   metFORMIN  (GLUCOPHAGE -XR) 500 MG 24 hr tablet Take 2 tablets (1,000 mg total) by mouth 2 (two) times daily with a meal. 120 tablet 0   Multiple Vitamin (MULTIVITAMIN) tablet Take 1 tablet by mouth 2 (two) times a week.     pantoprazole  (PROTONIX ) 40 MG tablet Take 1 tablet (40 mg total) by mouth daily. (Take in place of omeprazole  while taking clopidogrel .) 90 tablet 1   polyethylene glycol (MIRALAX  / GLYCOLAX ) 17 g packet Take 17 g by mouth 2 (two) times daily. (Patient taking differently: Take 17 g by mouth 2 (two) times daily as needed for moderate constipation.) 14 each 0   promethazine  (PHENERGAN ) 25 MG tablet Take 0.5-1 tablets (12.5-25 mg total) by mouth every 8 (eight) hours as  needed for nausea or vomiting. 20 tablet 0   Triamcinolone  Acetonide (TRIAMCINOLONE  0.1 % CREAM : EUCERIN) CREA Apply 1 Application topically 2 (two) times daily. 1 each 0   Blood Glucose Monitoring Suppl (ACCU-CHEK GUIDE) w/Device KIT Use daily to check blood sugar.  DX E11.69 1 kit 0   Lancets Misc. (ACCU-CHEK SOFTCLIX LANCET DEV) KIT Use daily to check blood sugar.  DX E11.69     tamsulosin  (FLOMAX ) 0.4 MG CAPS capsule Take 1 capsule (0.4 mg total) by mouth daily. (Patient not taking: Reported on 08/07/2023) 30 capsule 0   [DISCONTINUED] amLODipine  (NORVASC ) 2.5 MG tablet Take 1 tablet (2.5 mg total) by mouth daily. 30 tablet 2   [DISCONTINUED] escitalopram  (LEXAPRO ) 5 MG tablet Take 1 tablet (5 mg total) by mouth daily. 30 tablet 2    Past Medical History:  Diagnosis Date   BPH (benign prostatic hyperplasia)    CTS (carpal tunnel syndrome)    Depression    Diabetes mellitus    Essential hypertension    GERD (gastroesophageal reflux disease)    History of breast cancer    Hypercholesteremia    Neuropathy     Past Surgical History:  Procedure Laterality Date  CATARACT EXTRACTION, BILATERAL  01/18/2017   LITHOTRIPSY     MASTECTOMY Right 05/02/2018   PACEMAKER IMPLANT N/A 05/13/2023   Procedure: PACEMAKER IMPLANT;  Surgeon: Ardeen Kohler, MD;  Location: [facility] INVASIVE CV LAB;  Service: Cardiovascular;  Laterality: N/A;   TEMPORARY PACEMAKER N/A 05/11/2023   Procedure: TEMPORARY PACEMAKER;  Surgeon: [place]az[place], Peter M, MD;  Location: [facility] INVASIVE CV LAB;  Service: Cardiovascular;  Laterality: N/A;   TONSILLECTOMY AND ADENOIDECTOMY      Social History   Tobacco Use   Smoking status: Never   Smokeless tobacco: Never  Vaping Use   Vaping status: Never Used  Substance Use Topics   Alcohol use: No   Drug use: No    Family History  Problem Relation Age of Onset   Lymphoma Mother    Cancer Mother    Diabetes Father    Stroke Father    Ovarian cancer Sister    Cancer Sister     Cancer Paternal Grandmother     PE: Vitals:   08/07/23 0746 08/07/23 1023 08/07/23 1204 08/07/23 1626  BP: 114/80  95/62 115/62  Pulse: 76  80 76  Resp: 18  18 17   Temp: 97.9 F (36.6 C)  97.6 F (36.4 C) 97.7 F (36.5 C)  TempSrc: Oral  Oral Oral  SpO2: 100% (P) 99% 100% 97%  Weight:      Height:       Patient appears to be in no acute distress  patient is alert and oriented x3 Atraumatic normocephalic head No cervical or supraclavicular lymphadenopathy appreciated No increased work of breathing, no audible wheezes/rhonchi Regular sinus rhythm/rate Abdomen is soft, nontender, nondistended, no CVA or suprapubic tenderness GU: 14 French indwelling Foley catheter with clear yellow urine, penis retracted into suprapubic fat pad Grossly neurologically intact No identifiable skin lesions  Recent Labs    08/06/23 1713 08/06/23 1738 08/07/23 0208  WBC 9.6  --  13.3*  HGB 11.8* 11.9*  12.6* 9.7*  HCT 37.1* 35.0*  37.0* 29.8*   Recent Labs    08/06/23 1713 08/06/23 1738 08/07/23 0208  NA 136 137  137 137  K 3.8 3.7  3.7 3.7  CL 101 102 101  CO2 16*  --  19*  GLUCOSE 190* 185* 186*  BUN 12 10 12   CREATININE 1.11 0.80 1.02  CALCIUM  9.0  --  8.2*   Recent Labs    08/06/23 2344  INR 1.4*   No results for input(s): "LABURIN" in the last 72 hours. Results for orders placed or performed during the hospital encounter of 08/06/23  Blood Culture (routine x 2)     Status: None (Preliminary result)   Collection Time: 08/06/23  5:13 PM   Specimen: BLOOD  Result Value Ref Range Status   Specimen Description BLOOD RIGHT ANTECUBITAL  Final   Special Requests   Final    BOTTLES DRAWN AEROBIC ONLY Blood Culture results may not be optimal due to an inadequate volume of blood received in culture bottles   Culture  Setup Time   Final    GRAM NEGATIVE RODS AEROBIC BOTTLE ONLY CRITICAL VALUE NOTED.  VALUE IS CONSISTENT WITH PREVIOUSLY REPORTED AND CALLED VALUE. Performed at  [facility], [address] [place], [place] [address]    Culture GRAM NEGATIVE RODS  Final   Report Status PENDING  Incomplete  Urine Culture     Status: Abnormal (Preliminary result)   Collection Time: 08/06/23  5:13 PM   Specimen: Urine,  Random  Result Value Ref Range Status   Specimen Description URINE, RANDOM  Final   Special Requests URINE, CATHETERIZED  Final   Culture (A)  Final    >=100,000 COLONIES/mL GRAM NEGATIVE RODS CULTURE REINCUBATED FOR BETTER GROWTH Performed at [facility], [address], [place], [place] [address]    Report Status PENDING  Incomplete  Blood Culture (routine x 2)     Status: None (Preliminary result)   Collection Time: 08/06/23  5:26 PM   Specimen: BLOOD RIGHT HAND  Result Value Ref Range Status   Specimen Description BLOOD RIGHT HAND  Final   Special Requests   Final    BOTTLES DRAWN AEROBIC AND ANAEROBIC Blood Culture results may not be optimal due to an inadequate volume of blood received in culture bottles   Culture  Setup Time   Final    GRAM NEGATIVE RODS ANAEROBIC BOTTLE ONLY CRITICAL RESULT CALLED TO, READ BACK BY AND VERIFIED WITH: JWYLAND,[email or web address]  08/07/23 MK Performed at [facility], [address] [place], [place] [address]    Culture GRAM NEGATIVE RODS  Final   Report Status PENDING  Incomplete  Blood Culture ID Panel (Reflexed)     Status: Abnormal   Collection Time: 08/06/23  5:26 PM  Result Value Ref Range Status   Enterococcus faecalis NOT DETECTED NOT DETECTED Final   Enterococcus Faecium NOT DETECTED NOT DETECTED Final   Listeria monocytogenes NOT DETECTED NOT DETECTED Final   Staphylococcus species NOT DETECTED NOT DETECTED Final   Staphylococcus aureus (BCID) NOT DETECTED NOT DETECTED Final   Staphylococcus epidermidis NOT DETECTED NOT DETECTED Final   Staphylococcus lugdunensis NOT DETECTED NOT DETECTED Final   Streptococcus species NOT DETECTED NOT DETECTED Final   Streptococcus agalactiae  NOT DETECTED NOT DETECTED Final   Streptococcus pneumoniae NOT DETECTED NOT DETECTED Final   Streptococcus pyogenes NOT DETECTED NOT DETECTED Final   A.calcoaceticus-baumannii NOT DETECTED NOT DETECTED Final   Bacteroides fragilis NOT DETECTED NOT DETECTED Final   Enterobacterales DETECTED (A) NOT DETECTED Final    Comment: Enterobacterales represent a large order of gram negative bacteria, not a single organism. CRITICAL RESULT CALLED TO, READ BACK BY AND VERIFIED WITH: J WYLAND,[email or web address]  08/07/23 MK    Enterobacter cloacae complex NOT DETECTED NOT DETECTED Final   Escherichia coli DETECTED (A) NOT DETECTED Final    Comment: CRITICAL RESULT CALLED TO, READ BACK BY AND VERIFIED WITH: J WYLAND,[email or web address]  08/07/23 MK    Klebsiella aerogenes NOT DETECTED NOT DETECTED Final   Klebsiella oxytoca NOT DETECTED NOT DETECTED Final   Klebsiella pneumoniae NOT DETECTED NOT DETECTED Final   Proteus species NOT DETECTED NOT DETECTED Final   Salmonella species NOT DETECTED NOT DETECTED Final   Serratia marcescens NOT DETECTED NOT DETECTED Final   Haemophilus influenzae NOT DETECTED NOT DETECTED Final   Neisseria meningitidis NOT DETECTED NOT DETECTED Final   Pseudomonas aeruginosa NOT DETECTED NOT DETECTED Final   Stenotrophomonas maltophilia NOT DETECTED NOT DETECTED Final   Candida albicans NOT DETECTED NOT DETECTED Final   Candida auris NOT DETECTED NOT DETECTED Final   Candida glabrata NOT DETECTED NOT DETECTED Final   Candida krusei NOT DETECTED NOT DETECTED Final   Candida parapsilosis NOT DETECTED NOT DETECTED Final   Candida tropicalis NOT DETECTED NOT DETECTED Final   Cryptococcus neoformans/gattii NOT DETECTED NOT DETECTED Final   CTX-M ESBL NOT DETECTED NOT DETECTED Final   Carbapenem resistance IMP NOT DETECTED NOT DETECTED Final   Carbapenem resistance KPC NOT DETECTED  NOT DETECTED Final   Carbapenem resistance NDM NOT DETECTED NOT DETECTED Final   Carbapenem resist OXA 48 LIKE  NOT DETECTED NOT DETECTED Final   Carbapenem resistance VIM NOT DETECTED NOT DETECTED Final    Comment: Performed at [facility], [address], [place], [place] [address]  Resp panel by RT-PCR (RSV, Flu A&B, Covid) Anterior Nasal Swab     Status: None   Collection Time: 08/06/23  5:53 PM   Specimen: Anterior Nasal Swab  Result Value Ref Range Status   SARS Coronavirus 2 by RT PCR NEGATIVE NEGATIVE Final   Influenza A by PCR NEGATIVE NEGATIVE Final   Influenza B by PCR NEGATIVE NEGATIVE Final    Comment: (NOTE) The Xpert Xpress SARS-CoV-2/FLU/RSV plus assay is intended as an aid in the diagnosis of influenza from Nasopharyngeal swab specimens and should not be used as a sole basis for treatment. Nasal washings and aspirates are unacceptable for Xpert Xpress SARS-CoV-2/FLU/RSV testing.  Fact Sheet for Patients: BloggerCourse.com  Fact Sheet for Healthcare Providers: SeriousBroker.it  This test is not yet approved or cleared by the [place]  FDA and has been authorized for detection and/or diagnosis of SARS-CoV-2 by FDA under an Emergency Use Authorization (EUA). This EUA will remain in effect (meaning this test can be used) for the duration of the COVID-19 declaration under Section 564(b)(1) of the Act, 21 U.S.C. section 360bbb-3(b)(1), unless the authorization is terminated or revoked.     Resp Syncytial Virus by PCR NEGATIVE NEGATIVE Final    Comment: (NOTE) Fact Sheet for Patients: BloggerCourse.com  Fact Sheet for Healthcare Providers: SeriousBroker.it  This test is not yet approved or cleared by the [place]  FDA and has been authorized for detection and/or diagnosis of SARS-CoV-2 by FDA under an Emergency Use Authorization (EUA). This EUA will remain in effect (meaning this test can be used) for the duration of the COVID-19 declaration under Section  564(b)(1) of the Act, 21 U.S.C. section 360bbb-3(b)(1), unless the authorization is terminated or revoked.  Performed at [facility], [address], [place], [place] [address]     Imaging: CT Abd/Pelvis 08/06/23 IMPRESSION: 1. Distended gallbladder, with multiple calcified gallstones layering dependently. No CT evidence of acute cholecystitis. 2. Trace free fluid along the dome of the liver, nonspecific. 3.  Aortic Atherosclerosis (ICD10-I70.0).     Electronically Signed   By: Bobbye Burrow M.D.   On: 08/06/2023 23:20    A/P: Urinary retention Complicated UTI  -Urine culture speciation and sensitivities pending - Patient's Foley was changed 2.5 weeks ago and Foley change unnecessary right now - Urodynamic study scheduled for 08/16/2023 at [facility] urology which he should keep - Discussed with patient and wife that the study is necessary to determine bladder function and whether or not he would be able to void without a catheter or would need to manage with long-term SP tube/Foley/CIC - Continue tamsulosin  and finasteride   Please page with any further questions or concerns. Ulla Mckiernan D Jassmin Kemmerer

## 2023-08-07 NOTE — Assessment & Plan Note (Signed)
 -  Wound care consult

## 2023-08-07 NOTE — Assessment & Plan Note (Signed)
 Restart Eliquis

## 2023-08-07 NOTE — Assessment & Plan Note (Addendum)
-   pacemaker in place; 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

## 2023-08-07 NOTE — Assessment & Plan Note (Deleted)
-   Complicates overall prognosis and care - Body mass index is 26.91 kg/m.

## 2023-08-07 NOTE — Assessment & Plan Note (Signed)
 Status post pacemaker placement.

## 2023-08-07 NOTE — Consult Note (Signed)
 Please note that the [facility] nursing team is utilizing a standardized work plan to manage patient consults. We are triaging consults and will try to see the patients within 48 hours. Wound photos in the patient's chart allow [place] to consult on the patient in the most efficient and timely manner.    Guneet Delpino [facility], CNS, [employer] [phone]

## 2023-08-07 NOTE — Progress Notes (Signed)
 Patient refused New Catheter Insertion as he expressed discomfort and pain in the past Foley Insertions. MD made aware and might reach out to urology.

## 2023-08-07 NOTE — Assessment & Plan Note (Signed)
 CT scan showing gallstones but no evidence of acute cholecystitis. Patient comes in with sepsis suspect source being most likely urinary but given abnormal CT will obtain right upper quadrant ultrasound.  Patient does not endorse epigastric pain LFTs within normal limits this is reassuring currently on cefepime if there is any evidence of cholecystitis would add Flagyl but clinically doubt at this time

## 2023-08-07 NOTE — Assessment & Plan Note (Signed)
 -  continue protonix

## 2023-08-07 NOTE — Assessment & Plan Note (Signed)
-

## 2023-08-07 NOTE — Assessment & Plan Note (Signed)
 Patient's status post Foley catheter.  Has been seen by urology as an outpatient family would like to have urology follow-up closely. Restart Flomax  would defer to urology if patient could tolerate a trial of taking out Foley Recommend urology consult discussion in a.m.

## 2023-08-07 NOTE — Assessment & Plan Note (Signed)
-   B12 level 266 - Continue on replacement

## 2023-08-07 NOTE — Assessment & Plan Note (Signed)
-   A1c 7.5% on 08/06/2023 - Continue on SSI and CBG monitoring for now

## 2023-08-07 NOTE — Assessment & Plan Note (Signed)
-   no s/s exac - Recent echo 05/10/2023: EF 60 to 65%

## 2023-08-07 NOTE — Hospital Course (Signed)
 John [name] is a 78 year old male with PMH BPH s/p Foley placement, depression, HTN, DM II, HLD, neuropathy who presented with worsening weakness, lethargy, and recurrent falls at home. He recently had Foley catheter placement by urology on 07/13/2023 in the setting of BPH and outlet obstruction.  Due to the persistent weakness, he presented for further evaluation.  Urinalysis was concerning for signs of infection.  Blood cultures were drawn and he was admitted for further workup and monitoring. Imaging also showed cholelithiasis which also underwent further workup.

## 2023-08-07 NOTE — Assessment & Plan Note (Signed)
-   continue wound care as needed

## 2023-08-07 NOTE — Evaluation (Signed)
 Occupational Therapy Evaluation Patient Details Name: John [name] MRN: [id number] DOB: 08-06-1945 Today's Date: 08/07/2023   History of Present Illness   John [name] is a 78 y.o. male admitted 08/06/23 for suspected severe sepsis. Pt presented with AMS after a ground level fall at home. UA showing white and red blood cells as well as some moderate bacteria; urine cultures pending. CT abdomen showed distended gallbladder, with multiple calcified gallstones layering dependently. US  Abdomen demonstrated cholecystitis. PMH significant for T2DM, indwelling Foley catheter, GERD, BPH, HTN, HLD, s/p pacemaker, breast cancer, DVT/PE, and CVA.     Clinical Impressions At baseline, pt completes self-feeding and grooming Independently and receives Supervision from his wife for bathing, dressing, and toileting. At baseline, pt benefits from use of RW, but states he only uses it when his wife "is watching" him and reports at least 4 falls in the past 6 months. Pt now presents with decreased balance, impaired cardiopulmonary status, impaired cognition, mildly decreased B fine motor coordination, mildly edematous B UE, and decreased safety and independence with functional tasks. Pt currently demonstrates ability to complete UB ADLs with Set up to Contact guard assist, LB ADLs with Min assist, and functional transfers with a RW with Contact guard assist +2 for safety. Pt on 3L continuous O2 through nasal cannula upon arrival with O2 sat at 100%. Pt weaned to RA with O2 sat at >/93% on RA throughout session. Pt participated well in session but was limited by asymptomatic episode of orthostatic hypotension. Pt will benefit from acute skilled OT services to address deficits outlined below and to increase safety and independence with functional tasks. Post acute discharge, pt will benefit from [facility] OT to maximize rehab potential paired with 24/7 supervision/assistance from family for safety.      08/07/23 1023   Vital Signs  Patient Position (if appropriate) Orthostatic Vitals  Orthostatic Lying   BP- Lying 117/69  Pulse- Lying 80  Orthostatic Sitting  BP- Sitting 101/62  Pulse- Sitting 81  Orthostatic Standing at 0 minutes  BP- Standing at 0 minutes 92/51  Pulse- Standing at 0 minutes 88  Orthostatic Standing at 3 minutes  BP- Standing at 3 minutes 96/63  Pulse- Standing at 3 minutes 95  Oxygen Therapy  SpO2 99 %  O2 Device Room Air      If plan is discharge home, recommend the following:   Two people to help with walking and/or transfers;A lot of help with bathing/dressing/bathroom;Assistance with cooking/housework;Direct supervision/assist for medications management;Direct supervision/assist for financial management;Assist for transportation;Help with stairs or ramp for entrance;Supervision due to cognitive status     Functional Status Assessment   Patient has had a recent decline in their functional status and demonstrates the ability to make significant improvements in function in a reasonable and predictable amount of time.     Equipment Recommendations   None recommended by OT (Pt already has needed equipment)     Recommendations for Other Services         Precautions/Restrictions   Precautions Precautions: Fall Recall of Precautions/Restrictions: Impaired Precaution/Restrictions Comments: watch BP Restrictions Weight Bearing Restrictions Per Provider Order: No     Mobility Bed Mobility Overal bed mobility: Needs Assistance Bed Mobility: Supine to Sit     Supine to sit: Contact guard, +2 for safety/equipment, HOB elevated, Used rails     General bed mobility comments: Pt sat up on R side of bed, bringing BLE off EOB, and pushing up to reach seated position. CGA at trunk for  safety and +2 assist for line management.    Transfers Overall transfer level: Needs assistance Equipment used: Rolling walker (2 wheels) Transfers: Sit to/from Stand, Bed to  chair/wheelchair/BSC Sit to Stand: Contact guard assist, +2 physical assistance, +2 safety/equipment     Step pivot transfers: Contact guard assist, +2 safety/equipment     General transfer comment: Pt stood from lowest bed height. He attempted without physical assist and was unable to power up completely, but did clear his hips. VC for proper hand placement on RA and CGA x2 to power up with slightly increased time to reach erect posture. Pt transferred to recliner chair on his R by taking short slow steps, pivoting RW, and maintainnig body inside AD at all times.Good eccentric control with sitting.      Balance Overall balance assessment: Needs assistance Sitting-balance support: No upper extremity supported, Feet supported Sitting balance-Leahy Scale: Good     Standing balance support: Bilateral upper extremity supported, No upper extremity supported, During functional activity Standing balance-Leahy Scale: Fair Standing balance comment: Pt engaged in static stance for ~71mins without holding onto RW and was stable. He uses RW for transfers to maintain stability and CGA x2 for safety.                           ADL either performed or assessed with clinical judgement   ADL Overall ADL's : Needs assistance/impaired Eating/Feeding: Set up;Sitting   Grooming: Set up;Sitting   Upper Body Bathing: Set up;Supervision/ safety;Sitting   Lower Body Bathing: Minimal assistance;Cueing for safety;Cueing for compensatory techniques;Sit to/from stand   Upper Body Dressing : Contact guard assist;Sitting (assist for line management)   Lower Body Dressing: Minimal assistance;Sit to/from stand;Cueing for safety;Cueing for compensatory techniques   Toilet Transfer: Contact guard assist;+2 for safety/equipment;BSC/3in1;Rolling walker (2 wheels);Cueing for safety (step-pivot transfer; cues for hand placement) Toilet Transfer Details (indicate cue type and reason): simulated bed to  chair Toileting- Clothing Manipulation and Hygiene: Minimal assistance;Sit to/from stand;Cueing for safety;Cueing for compensatory techniques       Functional mobility during ADLs:  (deferred due to episode of asymptomatic orthostatic hypotension) General ADL Comments: Pt with mildly decreased activity tolerance     Vision Baseline Vision/History: 1 Wears glasses Ability to See in Adequate Light: 0 Adequate (with glasses) Patient Visual Report: No change from baseline       Perception         Praxis         Pertinent Vitals/Pain Pain Assessment Pain Assessment: Faces Faces Pain Scale: Hurts a little bit Pain Location: L hip and bottom where he fell Pain Descriptors / Indicators: Discomfort, Tender, Sore Pain Intervention(s): Monitored during session, Repositioned     Extremity/Trunk Assessment Upper Extremity Assessment Upper Extremity Assessment: Right hand dominant;Overall WFL for tasks assessed;RUE deficits/detail;LUE deficits/detail RUE Deficits / Details: UE strength grossly 4/5; mildly decreased fine motor coordinaiton; mildly edematous throughout UE RUE Coordination: decreased fine motor (mild) LUE Deficits / Details: Shoulder strength 4-/5, otherwise UE strength grossly 4/5; mildly decreased fine motor coordinaiton; mildly edematous throughout UE LUE Coordination: decreased fine motor (mild)   Lower Extremity Assessment Lower Extremity Assessment: Defer to PT evaluation RLE Deficits / Details: Grossly 4/5 strength RLE Sensation: history of peripheral neuropathy RLE Coordination: decreased gross motor LLE Deficits / Details: Pt has residual weakness from CVA in January 2025. He demonstrated grossly 3/5 strength. Did not resist d/t pt's c/o L hip pain with AROM. LLE: Unable to  fully assess due to pain LLE Sensation: history of peripheral neuropathy LLE Coordination: decreased gross motor   Cervical / Trunk Assessment Cervical / Trunk Assessment: Normal    Communication Communication Communication: Impaired Factors Affecting Communication: Hearing impaired   Cognition Arousal: Alert Behavior During Therapy: WFL for tasks assessed/performed Cognition: Cognition impaired, No family/caregiver present to determine baseline   Orientation impairments: Place, Time (Pt oriented to self. Pt initially stating he did not know where he was or why he was here then later reporting he knows he had a fall but doesn't leaving home. Pt alternately stating it was Friday and Saturday and reporting he fell Suday morning (today).) Awareness: Intellectual awareness impaired, Online awareness impaired Memory impairment (select all impairments): Short-term memory, Working Civil [employer] fast streamer, Engineer, structural memory Attention impairment (select first level of impairment): Selective attention Executive functioning impairment (select all impairments): Organization, Reasoning, Problem solving OT - Cognition Comments: Pt largely pleasant throughout session and agreeable to participation in OT/PT evals. Pt with some internal distraction and sometimes tangential in conversation with cues to maintain attention to current conversation/tasks. Pt with poor safety awareness and impaired insight into deficits.                 Following commands: Intact       Cueing  General Comments   Cueing Techniques: Verbal cues;Gestural cues;Visual cues  Orthostatic Vitals taken. Pt had asymptomatic orthostatic hypotension as he denied dizziness and lightheadedness. Pt greeted on 2L O2 via [place] with SpO2 100%, weaned him to RA with SpO2 maintained >93%. Pt with BLE edema, which he reports is baseline.   Exercises     Shoulder Instructions      Home Living Family/patient expects to be discharged to:: Private residence Living Arrangements: Spouse/significant other Available Help at Discharge: Family;Available 24 hours/day Type of Home: House Home Access: Stairs to enter [employer] of Steps: 3 Entrance Stairs-Rails: Right;Left;Can reach both Home Layout: Two level;Able to live on main level with bedroom/bathroom Alternate Level Stairs-Number of Steps: 14 Alternate Level Stairs-Rails: Right;Left;Can reach both Bathroom Shower/Tub: Walk-in shower;Curtain   Bathroom Toilet: Handicapped height Bathroom Accessibility: No   Home Equipment: Agricultural consultant (2 wheels);BSC/3in1;Shower seat;Lift chair          Prior Functioning/Environment Prior Level of Function : Needs assist;History of Falls (last six months)       Physical Assist : ADLs (physical);Mobility (physical) Mobility (physical): Gait;Stairs ADLs (physical): Bathing;Dressing;IADLs Mobility Comments: Pt reports his wife makes him use a RW and if she is not present he doesn't use an AD. Pt stated he hasn't been upstairs in 3 months as his wife won't let him go up there, but also reports he "snuck upstairs" when wife wasn't there to use upstairs shower. Pt has had at least 4 falls in the last 74mo d/t LOB and feeling dizzy. Pt reports sitting in a recliner chair often. (Per chart review pt d/c from CIR 1/26 at CGA/SBA level for transfers and ambulation with RW.) ADLs Comments: Pt Independent with self-feeding and grooming. Pt reports bathing, dressing, and toileting with Supervision from wife for safety. Pt also reports use of shower chair, which he reports he dislikes but his wife "makes me use it." Pt's wife assists with IADLs. Pt is a retired Passenger transport manager. (Per chart review pt d/c from [facility] 1/26 at CGA/SBA level for transfers and ambulation with RW.)    OT Problem List: Decreased strength;Decreased activity tolerance;Impaired balance (sitting and/or standing);Decreased coordination;Decreased safety awareness;Decreased knowledge of use of  DME or AE;Cardiopulmonary status limiting activity;Decreased cognition   OT Treatment/Interventions: Self-care/ADL training;Energy conservation;DME and/or  AE instruction;Therapeutic exercise;Therapeutic activities;Cognitive remediation/compensation;Patient/family education;Balance training      OT Goals(Current goals can be found in the care plan section)   Acute Rehab OT Goals Patient Stated Goal: to return home OT Goal Formulation: With patient Time For Goal Achievement: 08/21/23 Potential to Achieve Goals: Good ADL Goals Pt Will Perform Grooming: with modified independence;standing Pt Will Perform Lower Body Bathing: with supervision;sitting/lateral leans;sit to/from stand Pt Will Perform Lower Body Dressing: with supervision;sitting/lateral leans;sit to/from stand Pt Will Transfer to Toilet: with modified independence;ambulating;regular height toilet (with least restrictive AD) Pt Will Perform Toileting - Clothing Manipulation and hygiene: with supervision;sitting/lateral leans;sit to/from stand Additional ADL Goal #1: Patient will demonstrate understanding through teach back of education in fall prevention strategies, including monitoring and appropriately responding to episodes of orthostatic hypotension, with handout provided.   OT Frequency:  Min 2X/week    Co-evaluation PT/OT/SLP Co-Evaluation/Treatment: Yes Reason for Co-Treatment: For patient/therapist safety;To address functional/ADL transfers PT goals addressed during session: Mobility/safety with mobility;Balance OT goals addressed during session: ADL's and self-care      AM-PAC OT "6 Clicks" Daily Activity     Outcome Measure Help from another person eating meals?: A Little Help from another person taking care of personal grooming?: A Little Help from another person toileting, which includes using toliet, bedpan, or urinal?: A Little Help from another person bathing (including washing, rinsing, drying)?: A Little Help from another person to put on and taking off regular upper body clothing?: A Little Help from another person to put on and taking off regular lower body  clothing?: A Little 6 Click Score: 18   End of Session Equipment Utilized During Treatment: Gait belt;Rolling walker (2 wheels) Nurse Communication: Mobility status;Other (comment) (Pt weaned to RA with O2 sat >/ 93%. Pt with episode of asymptomatic orhtostatic hypotension.)  Activity Tolerance: Patient tolerated treatment well;Treatment limited secondary to medical complications (Comment) (Pt limited by episode of asymptomatic orthostatic hypotension) Patient left: in chair;with call bell/phone within reach;with chair alarm set  OT Visit Diagnosis: Unsteadiness on feet (R26.81);Other abnormalities of gait and mobility (R26.89);History of falling (Z91.81);Repeated falls (R29.6)                Time: 4098-1191 OT Time Calculation (min): 30 min Charges:  OT General Charges $OT Visit: 1 Visit OT Evaluation $OT Eval Moderate Complexity: 1 Mod OT Treatments $Self Care/Home Management : 8-22 mins  John [name] "John Ellis., OTR/L, MA Acute Rehab [phone]  John [name] 08/07/2023, 2:44 PM

## 2023-08-07 NOTE — Assessment & Plan Note (Signed)
-   Presumed translocation from urinary source; awaiting HIDA results though - 2/3 bottles with E. 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

## 2023-08-07 NOTE — Consult Note (Signed)
 Palliative Medicine Inpatient Consult Note  Consulting Provider:  Doutova, Anastassia, MD   Reason for consult:   Palliative Care Consult Services Palliative Medicine Consult  Reason for Consult? goals of care   08/07/2023  HPI:  Per intake H&P --> Mr. Trierweiler is a 78 year old male with PMH BPH s/p Foley placement, depression, HTN, DM II, HLD, neuropathy who presented with worsening weakness, lethargy, and recurrent falls at home.   Palliative care has been asked to support additional goals of care conversations.   Clinical Assessment/Goals of Care:  *Please note that this is a verbal dictation therefore any spelling or grammatical errors are due to the "Dragon Medical One" system interpretation.  I have reviewed medical records including EPIC notes, labs and imaging, received report from bedside RN, assessed the patient who is lying in bed in NAD.    I met with Emery Hans to further discuss diagnosis prognosis, GOC, EOL wishes, disposition and options.   I introduced Palliative Medicine as specialized medical care for people living with serious illness. It focuses on providing relief from the symptoms and stress of a serious illness. The goal is to improve quality of life for both the patient and the family.  Medical History Review and Understanding:  A review of Jeury's past medical history inclusive of hypertension, congestive heart failure, type 2 diabetes, BPH requiring chronic Foley, GERD, hyperlipidemia, male breast cancer, and peripheral neuropathy.  Social History:  Christop is from [place]  originally.  He shares that he has been married to his wife, [name] for the past 55 years.  He has 1 son he lives in [place] and 1 daughter who lives in [place].  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   Functional and Nutritional State:  Preceding hospitalization Isaiyah shares that he was able to walk and perform most B ADLs on his own.  He has had a good appetite overall and does not note any recent weight loss.  Advance Directives:  A detailed discussion was had today regarding advanced directives.  Bertrum shares that he has not completed advanced directives.  Code Status:  Concepts specific to code status, artifical feeding and hydration, continued IV antibiotics and rehospitalization was had.  The difference between a aggressive medical intervention path  and a palliative comfort care path for this patient at this time was had.   Encouraged patient/family to consider DNR/DNI status understanding evidenced based poor outcomes in similar hospitalized patient, as the cause of arrest is likely associated with advanced chronic/terminal illness rather than an easily reversible acute cardio-pulmonary event. I explained that DNR/DNI does not change the medical plan and it only comes into effect after a person has arrested (died).  It is a protective measure to keep us  from harming the patient in their last moments of life.  Antionio was agreeable to DNR/DNI with understanding that patient would not receive CPR, defibrillation, ACLS medications, or intubation.   Discussion:  Jadiel and I discussed his health over the last few months.  He shares that he suffered a stroke on January 2 of this year causing him to go to CIR which she was discharged from on the 25th and once he got home the next day  had an episode where he had passed out and was quite disoriented per his understanding it was unclear why.  He was then rehospitalized.  Since after that hospitalization he went to [facility] skilled nursing facility and then transition to home.  Tyonne shares the reason why he came in this time around was that he was more disoriented in  his home and had a fall.  His wife called EMS so that he could be evaluated.  Khyri shares since being here he has been worked up for urinary infection and possible gallbladder disease.  Jessee Mormon is aware of his recurrent readmissions to the hospital though shares his goals are for improvement.  He notes that he is physically deconditioned and does want to go to CIR again given his prior experience.  Discussed the importance of continued conversation with family and their  medical providers regarding overall plan of care and treatment options, ensuring decisions are within the context of the patients values and GOCs.  Decision Maker: Kamp,[name] (Spouse): [phone] ([place])   SUMMARY OF RECOMMENDATIONS   DNAR/DNI  Have reviewed a MOST form with Aron Lard - he plans to discuss this with his wife  Goals are for improvement and possible CIR if patient deemed a candidate  Ongoing PMT support  Code Status/Advance Care Planning: DNAR/DNI   Palliative Prophylaxis:  Aspiration, Bowel Regimen, Delirium Protocol, Frequent Pain Assessment, Oral Care, Palliative Wound Care, and Turn Reposition  Additional Recommendations (Limitations, Scope, Preferences): Continue current care  Psycho-social/Spiritual:  Desire for further Chaplaincy support: Yes Additional Recommendations: Education on chronic disease   Prognosis: Recurrent re-hospitalizations, high chronic disease burden place Miciah at an increased 12 month mortality risk.   Discharge Planning: Discharge plan to be determined. Patient interested in skilled nursing.  Vitals:   08/07/23 1023 08/07/23 1204  BP:  95/62  Pulse:  80  Resp:  18  Temp:  97.6 F (36.4 C)  SpO2: (P) 99% 100%    Intake/Output Summary (Last 24 hours) at 08/07/2023 1619 Last data filed at 08/07/2023 1440 Gross per 24 hour  Intake 480 ml  Output 1500 ml  Net -1020 ml   Last Weight  Most recent update: 08/07/2023 12:58 AM    Weight  90 kg (198 lb 6.6 oz)              Gen:  Elderly Caucasian M chronically ill appearing HEENT: Dry mucous membranes CV: Regular rate and rhythm  PULM:  On RA, breathing is even and nonlabored ABD: soft/nontender  EXT: No edema  Neuro: Alert and oriented x3   PPS: 50%   This conversation/these recommendations were discussed with patient primary care team, Dr. Gladstone Lamer  Total Time: 73 Billing based on MDM: High ______________________________________________________ Camille C[facility] [place] Palliative Medicine Team Team Cell Phone: [phone] Please utilize secure chat with additional questions, if there is no response within 30 minutes please call the above phone number  Palliative Medicine Team providers are available by phone from 7am to 7pm daily and can be reached through the team cell phone.  Should this patient require assistance outside of these hours, please call the patient's attending physician.

## 2023-08-07 NOTE — Assessment & Plan Note (Signed)
 Hold Neurontin  given mental status changes

## 2023-08-07 NOTE — Plan of Care (Signed)

## 2023-08-07 NOTE — Progress Notes (Signed)
 PHARMACY - PHYSICIAN COMMUNICATION CRITICAL VALUE ALERT - BLOOD CULTURE IDENTIFICATION (BCID)  John [name] is an 78 y.o. male who presented to [facility] on 08/06/2023 with a chief complaint of AMS  Assessment:  1/3 blood cultures: E. Coli (no gene resistance)  Name of physician (or Provider) Contacted: Dr. Gladstone Lamer   Current antibiotics: Cefepime 2g IV every 8 hours  Changes to prescribed antibiotics recommended:   -Continue current course  Results for orders placed or performed during the hospital encounter of 08/06/23  Blood Culture ID Panel (Reflexed) (Collected: 08/06/2023  5:26 PM)  Result Value Ref Range   Enterococcus faecalis NOT DETECTED NOT DETECTED   Enterococcus Faecium NOT DETECTED NOT DETECTED   Listeria monocytogenes NOT DETECTED NOT DETECTED   Staphylococcus species NOT DETECTED NOT DETECTED   Staphylococcus aureus (BCID) NOT DETECTED NOT DETECTED   Staphylococcus epidermidis NOT DETECTED NOT DETECTED   Staphylococcus lugdunensis NOT DETECTED NOT DETECTED   Streptococcus species NOT DETECTED NOT DETECTED   Streptococcus agalactiae NOT DETECTED NOT DETECTED   Streptococcus pneumoniae NOT DETECTED NOT DETECTED   Streptococcus pyogenes NOT DETECTED NOT DETECTED   A.calcoaceticus-baumannii NOT DETECTED NOT DETECTED   Bacteroides fragilis NOT DETECTED NOT DETECTED   Enterobacterales DETECTED (A) NOT DETECTED   Enterobacter cloacae complex NOT DETECTED NOT DETECTED   Escherichia coli DETECTED (A) NOT DETECTED   Klebsiella aerogenes NOT DETECTED NOT DETECTED   Klebsiella oxytoca NOT DETECTED NOT DETECTED   Klebsiella pneumoniae NOT DETECTED NOT DETECTED   Proteus species NOT DETECTED NOT DETECTED   Salmonella species NOT DETECTED NOT DETECTED   Serratia marcescens NOT DETECTED NOT DETECTED   Haemophilus influenzae NOT DETECTED NOT DETECTED   Neisseria meningitidis NOT DETECTED NOT DETECTED   Pseudomonas aeruginosa NOT DETECTED NOT DETECTED   Stenotrophomonas  maltophilia NOT DETECTED NOT DETECTED   Candida albicans NOT DETECTED NOT DETECTED   Candida auris NOT DETECTED NOT DETECTED   Candida glabrata NOT DETECTED NOT DETECTED   Candida krusei NOT DETECTED NOT DETECTED   Candida parapsilosis NOT DETECTED NOT DETECTED   Candida tropicalis NOT DETECTED NOT DETECTED   Cryptococcus neoformans/gattii NOT DETECTED NOT DETECTED   CTX-M ESBL NOT DETECTED NOT DETECTED   Carbapenem resistance IMP NOT DETECTED NOT DETECTED   Carbapenem resistance KPC NOT DETECTED NOT DETECTED   Carbapenem resistance NDM NOT DETECTED NOT DETECTED   Carbapenem resist OXA 48 LIKE NOT DETECTED NOT DETECTED   Carbapenem resistance VIM NOT DETECTED NOT DETECTED    Young Hensen, PharmD. Clinical Pharmacist 08/07/2023 7:09 AM

## 2023-08-07 NOTE — Assessment & Plan Note (Addendum)
 Contributing to comorbidity and complicating medical management  Body mass index is 27.94 kg/m.  Nutritional follow up as an out pt would be recommended Has been losing weight

## 2023-08-07 NOTE — Assessment & Plan Note (Signed)
 In the setting of dehydration poor p.o. intake, metformin , sepsis, and hepatic steatosis likely contributes to poor clearing.  Currently improving with IV fluid rehydration.

## 2023-08-07 NOTE — Assessment & Plan Note (Signed)
-   Follows with urology - Foley in place due to recent outlet obstruction

## 2023-08-07 NOTE — Assessment & Plan Note (Signed)
-   recent history of acute DVT in left common femoral vein and SF junction on 05/09/23 - recent history of PE in RLL on 05/09/23 - has been compliant on Eliquis  at home - Heparin  drip started and Eliquis  placed on hold in setting of potential surgery for cholecystectomy

## 2023-08-07 NOTE — Assessment & Plan Note (Addendum)
-   Tachycardia, tachypnea, leukocytosis, lactic acidosis. Suspected urinary source - Lactate normalized after fluid resuscitation - UA consistent with infection; has foley in place since 4/2, placed in ER (unclear if changed since, but needs to be exchanged nonetheless in context of infection) - patient declining nursing from exchanging foley; will reach out to discuss with urology on next steps - follow up urine culture - follow up blood cultures, see bacteremia - continue abx

## 2023-08-07 NOTE — Evaluation (Signed)
 Physical Therapy Evaluation Patient Details Name: John [name] MRN: [id number] DOB: October 01, 1945 Today's Date: 08/07/2023  History of Present Illness  John [name] is a 78 y.o. male admitted 08/06/23 for suspected severe sepsis. Pt presented with AMS after a ground level fall at home. UA showing white and red blood cells as well as some moderate bacteria; urine cultures pending. CT abdomen showed distended gallbladder, with multiple calcified gallstones layering dependently. US  Abdomen demonstrated cholecystitis. PMH significant for T2DM, indwelling Foley catheter, GERD, BPH, HTN, HLD, s/p pacemaker, breast cancer, DVT/PE, and CVA.   Clinical Impression  Pt admitted with above diagnosis. PTA, pt required assistance with functional mobility using RW and required assistance with ADLs/IADLs. He lives with his wife in a two story house with 3 STE and BHR where he can remain on the main level. Pt currently with functional limitations due to the deficits listed below (see PT Problem List). He required CGA x2 for safety with functional mobility using RW. He is experiencing asymptomatic orthostatic hypotension. Pt will benefit from acute skilled PT to increase their independence and safety with mobility to allow discharge home with HHPT.     08/07/23 1023  Vital Signs  Patient Position (if appropriate) Orthostatic Vitals  Orthostatic Lying   BP- Lying 117/69  Pulse- Lying 80  Orthostatic Sitting  BP- Sitting 101/62  Pulse- Sitting 81  Orthostatic Standing at 0 minutes  BP- Standing at 0 minutes 92/51  Pulse- Standing at 0 minutes 88  Orthostatic Standing at 3 minutes  BP- Standing at 3 minutes 96/63  Pulse- Standing at 3 minutes 95  Oxygen Therapy  SpO2 99 %  O2 Device Room Air         If plan is discharge home, recommend the following: A little help with walking and/or transfers;A little help with bathing/dressing/bathroom;Assistance with cooking/housework;Assist for  transportation;Help with stairs or ramp for entrance   Can travel by private vehicle        Equipment Recommendations None recommended by PT (Pt already has DME)  Recommendations for Other Services       Functional Status Assessment Patient has had a recent decline in their functional status and demonstrates the ability to make significant improvements in function in a reasonable and predictable amount of time.     Precautions / Restrictions Precautions Precautions: Fall Recall of Precautions/Restrictions: Intact Precaution/Restrictions Comments: watch BP Restrictions Weight Bearing Restrictions Per Provider Order: No      Mobility  Bed Mobility Overal bed mobility: Needs Assistance Bed Mobility: Supine to Sit     Supine to sit: HOB elevated, Contact guard, +2 for safety/equipment, Used rails     General bed mobility comments: Pt sat up on R side of bed, bringing BLE off EOB, and pushing up to reach seated position. CGA at trunk for safety and +2 assist for line management.    Transfers Overall transfer level: Needs assistance Equipment used: Rolling walker (2 wheels) Transfers: Sit to/from Stand, Bed to chair/wheelchair/BSC Sit to Stand: Contact guard assist, +2 physical assistance, +2 safety/equipment   Step pivot transfers: Contact guard assist, +2 safety/equipment       General transfer comment: Pt stood from lowest bed height. He attempted without physical assist and was unable to power up completely, but did clear his hips. VC for proper hand placement on RA and CGA x2 to power up with slightly increased time to reach erect posture. Pt transferred to recliner chair on his R by taking short slow steps, pivoting  RW, and maintainnig body inside AD at all times.Good eccentric control with sitting.    Ambulation/Gait               General Gait Details: Deferred as pt stated "this is all I think I can do today."  Stairs            Wheelchair Mobility      Tilt Bed    Modified Rankin (Stroke Patients Only)       Balance Overall balance assessment: Needs assistance Sitting-balance support: No upper extremity supported, Feet supported Sitting balance-Leahy Scale: Good     Standing balance support: Bilateral upper extremity supported, No upper extremity supported, During functional activity Standing balance-Leahy Scale: Fair Standing balance comment: Pt engaged in static stance for ~11mins without holding onto RW and was stable. He uses RW for transfers to maintain stability and CGA x2 for safety.                             Pertinent Vitals/Pain Pain Assessment Pain Assessment: Faces Faces Pain Scale: Hurts a little bit Pain Location: L hip and bottom where he fell Pain Descriptors / Indicators: Discomfort, Tender, Sore Pain Intervention(s): Monitored during session, Repositioned    Home Living Family/patient expects to be discharged to:: Private residence Living Arrangements: Spouse/significant other Available Help at Discharge: Family;Available 24 hours/day Type of Home: House Home Access: Stairs to enter Entrance Stairs-Rails: Right;Left;Can reach both Entrance Stairs-Number of Steps: 3 Alternate Level Stairs-Number of Steps: 14 Home Layout: Two level;Able to live on main level with bedroom/bathroom Home Equipment: Rolling Walker (2 wheels);BSC/3in1;Shower seat      Prior Function Prior Level of Function : Needs assist;History of Falls (last six months)       Physical Assist : ADLs (physical);Mobility (physical) Mobility (physical): Gait;Stairs ADLs (physical): Bathing;Dressing;IADLs Mobility Comments: Pt reports his wife makes him use a RW and if she is not present he doesn't use an AD. Pt stated he hasn't been upstairs in 3 months as his wife won't let him go up there. Pt has had at least 4 falls in the last 20mo d/t LOB and feeling dizzy. Pt reports sitting in a recliner chair often. (Per chart review pt  d/c from CIR 1/26 at CGA/SBA level for transfers and ambulation with RW.) ADLs Comments: Pt reports his wife makes him bath in a shower chair, which he dislikes and will monitor him for safety. He is able to feed and groom himself. Relies on his wife for IADLs. 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 He demonstrated grossly 3/5 strength. Did not resist d/t pt's c/o L hip pain with AROM. LLE: Unable to fully assess due to pain LLE Sensation: history of peripheral neuropathy LLE Coordination: decreased gross motor    Cervical / Trunk Assessment Cervical / Trunk Assessment: Normal  Communication   Communication Communication: Impaired Factors Affecting Communication: Hearing impaired    Cognition Arousal: Alert Behavior During Therapy: WFL for tasks assessed/performed   PT - Cognitive impairments: No family/caregiver present to determine  baseline, Awareness, Orientation, Safety/Judgement   Orientation impairments: Time, Situation, Place                   PT - Cognition Comments: Pt was confused and frustrated and start of session citing "I don't know what is going on, where I am, why I am here, or how I got here. I just woke up and I was in this place." Provided re-orientation to pt. Following commands: Intact       Cueing Cueing Techniques: Verbal cues, Gestural cues     General Comments General comments (skin integrity, edema, etc.): Orthostatic Vitals taken. Pt had asymptomatic orthostatic  hypotension as he denied dizziness and lightheadedness. Pt greeted on 2L O2 via [place] with SpO2 100%, weaned him to RA with SpO2 maintained >93%. Pt with BLE edema, which he reports is baseline.    Exercises     Assessment/Plan    PT Assessment Patient needs continued PT services  PT Problem List Cardiopulmonary status limiting activity;Decreased strength;Decreased activity tolerance;Decreased balance;Decreased safety awareness;Decreased knowledge of use of DME       PT Treatment Interventions DME instruction;Gait training;Stair training;Functional mobility training;Therapeutic activities;Therapeutic exercise;Balance training;Patient/family education    PT Goals (Current goals can be found in the Care Plan section)  Acute Rehab PT Goals Patient Stated Goal: Return Home PT Goal Formulation: With patient Time For Goal Achievement: 08/21/23 Potential to Achieve Goals: Good    Frequency Min 2X/week     Co-evaluation PT/OT/SLP Co-Evaluation/Treatment: Yes Reason for Co-Treatment: For patient/therapist safety;To address functional/ADL transfers PT goals addressed during session: Mobility/safety with mobility;Balance         AM-PAC PT "6 Clicks" Mobility  Outcome Measure Help needed turning from your back to your side while in a flat bed without using bedrails?: A Little Help needed moving from lying on your back to sitting on the side of a flat bed without using bedrails?: A Little Help needed moving to and from a bed to a chair (including a wheelchair)?: A Little Help needed standing up from a chair using your arms (e.g., wheelchair or bedside chair)?: A Little Help needed to walk in hospital room?: Total Help needed climbing 3-5 steps with a railing? : Total 6 Click Score: 14    End of Session Equipment Utilized During Treatment: Gait belt Activity Tolerance: Patient tolerated treatment well;Patient limited by fatigue Patient left: in chair;with call bell/phone within  reach;with chair alarm set Nurse Communication: Mobility status;Other (comment) (Titrated pt to RA. (+) orthostatic hypotension but pt asymptomatic.) PT Visit Diagnosis: Muscle weakness (generalized) (M62.81);Difficulty in walking, not elsewhere classified (R26.2);Other abnormalities of gait and mobility (R26.89);Unsteadiness on feet (R26.81)    Time: 1610-9604 PT Time Calculation (min) (ACUTE ONLY): 30 min   Charges:   PT Evaluation $PT Eval Moderate Complexity: 1 Mod   PT General Charges $$ ACUTE PT VISIT: 1 Visit         Glenford Lanes, PT, DPT Acute Rehabilitation Services Office: [phone] Secure Chat Preferred  Riva Chester 08/07/2023, 12:48 PM

## 2023-08-07 NOTE — Assessment & Plan Note (Signed)
-   No BP meds noted on med rec and currently soft blood pressure

## 2023-08-07 NOTE — Assessment & Plan Note (Signed)
Will replace and recheck in a.m. 

## 2023-08-07 NOTE — Assessment & Plan Note (Signed)
-   In setting of severe sepsis from bacteremia - Mentation improved this morning

## 2023-08-07 NOTE — Assessment & Plan Note (Signed)
-   From presumed bacteremia but has also had some deconditioning - patient/wife requesting CIR eval

## 2023-08-07 NOTE — Assessment & Plan Note (Signed)
-   Iron stores low - Start on oral iron

## 2023-08-07 NOTE — Assessment & Plan Note (Addendum)
-   denies abd pain; however with further collateral information from his wife later in the afternoon, she states patient has had persistent vomiting with most meals but notably fatty meals; denies known history of gastroparesis; symptoms have been going on over 1 month she states and has been becoming more of a problem recently - Cholelithiasis and gallbladder sludge with mild wall thickening noted on RUQ ultrasound; low suspicion for acute chole. Presentation maybe more consistent with chronic chole? Vs symptomatic cholelithiasis vs biliary dyskinesia - given insistence on further workup from wife, will obtain HIDA scan for further workup and then discuss with surgery depending on results

## 2023-08-07 NOTE — Plan of Care (Signed)
 The patient remains on MC-6E at time of writing. Upon change of shift, the patient has orders for MIVF but no patent PIVs. This RN unsuccessful x 2 attempts for PIV insertion; IV team consult placed. Dr. Achilles Holes notified overnight at 2100 hours by the RN via EPIC chat regarding the patient's order for "q 2 hour vital signs"; awaiting new orders as of time of writing. Dr. Achilles Holes also notified overnight at 2202 hours by the RN via EPIC chat regarding the patient's request for a "sleeping pill"; new orders acknowledged. Patient's admission profile completed overnight by this RN.   Problem: Fluid Volume: Goal: Hemodynamic stability will improve Outcome: Progressing   Problem: Clinical Measurements: Goal: Diagnostic test results will improve Outcome: Progressing Goal: Signs and symptoms of infection will decrease Outcome: Progressing   Problem: Respiratory: Goal: Ability to maintain adequate ventilation will improve Outcome: Progressing   Problem: Education: Goal: Ability to describe self-care measures that may prevent or decrease complications (Diabetes Survival Skills Education) will improve Outcome: Progressing Goal: Individualized Educational Video(s) Outcome: Progressing   Problem: Coping: Goal: Ability to adjust to condition or change in health will improve Outcome: Progressing   Problem: Fluid Volume: Goal: Ability to maintain a balanced intake and output will improve Outcome: Progressing   Problem: Health Behavior/Discharge Planning: Goal: Ability to identify and utilize available resources and services will improve Outcome: Progressing Goal: Ability to manage health-related needs will improve Outcome: Progressing   Problem: Metabolic: Goal: Ability to maintain appropriate glucose levels will improve Outcome: Progressing   Problem: Nutritional: Goal: Maintenance of adequate nutrition will improve Outcome: Progressing Goal: Progress toward achieving an optimal weight will  improve Outcome: Progressing   Problem: Skin Integrity: Goal: Risk for impaired skin integrity will decrease Outcome: Progressing   Problem: Tissue Perfusion: Goal: Adequacy of tissue perfusion will improve Outcome: Progressing   Problem: Education: Goal: Knowledge of General Education information will improve Description: Including pain rating scale, medication(s)/side effects and non-pharmacologic comfort measures Outcome: Progressing   Problem: Health Behavior/Discharge Planning: Goal: Ability to manage health-related needs will improve Outcome: Progressing   Problem: Clinical Measurements: Goal: Ability to maintain clinical measurements within normal limits will improve Outcome: Progressing Goal: Will remain free from infection Outcome: Progressing Goal: Diagnostic test results will improve Outcome: Progressing Goal: Respiratory complications will improve Outcome: Progressing Goal: Cardiovascular complication will be avoided Outcome: Progressing   Problem: Activity: Goal: Risk for activity intolerance will decrease Outcome: Progressing   Problem: Nutrition: Goal: Adequate nutrition will be maintained Outcome: Progressing   Problem: Coping: Goal: Level of anxiety will decrease Outcome: Progressing   Problem: Elimination: Goal: Will not experience complications related to bowel motility Outcome: Progressing Goal: Will not experience complications related to urinary retention Outcome: Progressing   Problem: Pain Managment: Goal: General experience of comfort will improve and/or be controlled Outcome: Progressing   Problem: Safety: Goal: Ability to remain free from injury will improve Outcome: Progressing   Problem: Skin Integrity: Goal: Risk for impaired skin integrity will decrease Outcome: Progressing

## 2023-08-07 NOTE — Assessment & Plan Note (Signed)
 Will benefit from PT/OT prior to DC

## 2023-08-07 NOTE — Assessment & Plan Note (Signed)
 Order sliding scale hold p.o. med metformin 

## 2023-08-07 NOTE — Assessment & Plan Note (Signed)
-   On Lipitor at home 

## 2023-08-07 NOTE — Assessment & Plan Note (Signed)
-   Foley catheter placed on 07/13/2023 due to outlet obstruction from BPH; exchanged on 07/21/23 at [facility]  - Has outpatient follow-up with urology - Continue Foley; not rec'd to be changed at this time per urology; if GI workup negative, may need to re-discuss with urology

## 2023-08-07 NOTE — Progress Notes (Addendum)
 Progress Note    John [name]   RJJ:[id number]  DOB: Aug 10, 1945  DOA: 08/06/2023     1 PCP: Jobe Mulder, DO  Initial CC: weakness, falls  Hospital Course: [name] is a 78 year old male with PMH BPH s/p Foley placement, depression, HTN, DM II, HLD, neuropathy who presented with worsening weakness, lethargy, and recurrent falls at home. He recently had Foley catheter placement by urology on 07/13/2023 in the setting of BPH and outlet obstruction.  Due to the persistent weakness, he presented for further evaluation.  Urinalysis was concerning for signs of infection.  Blood cultures were drawn and he was admitted for further workup and monitoring.  Interval History:  Mentation seems to have improved this morning.  He is more awake and alert.  Reviewed findings regarding infectious workup with him. Foley in place, and he's declining from allowing nursing staff to exchange it at this time.   Assessment and Plan: * Severe sepsis (HCC) - Tachycardia, tachypnea, leukocytosis, lactic acidosis. Suspected urinary source - Lactate normalized after fluid resuscitation - UA consistent with infection; has foley in place since 4/2, placed in ER (unclear if changed since, but needs to be exchanged nonetheless in context of infection) - patient declining nursing from exchanging foley; will reach out to discuss with urology on next steps - follow up urine culture - follow up blood cultures, see bacteremia - continue abx  E coli bacteremia - Presumed translocation from urinary source - 2/3 bottles with E. Coli; historically has had E.coli in urine rather pansens at least to CTX - d/c cefepime and start rocephin   Cholelithiasis - denies abd pain; however with further collateral information from his wife later in the afternoon, she states patient has had persistent vomiting with most meals but notably fatty meals; denies known history of gastroparesis; symptoms have been going on over 1  month she states and has been becoming more of a problem recently - Cholelithiasis and gallbladder sludge with mild wall thickening noted on RUQ ultrasound; low suspicion for acute chole. Presentation maybe more consistent with chronic chole? Vs symptomatic cholelithiasis vs biliary dyskinesia - given insistence on further workup from wife, will obtain HIDA scan for further workup and then discuss with surgery depending on results  Acute urinary retention - Foley catheter placed on 07/13/2023 due to outlet obstruction from BPH - Has outpatient follow-up with urology - Continue Foley  Personal history of DVT (deep vein thrombosis) - continue eliquis    Fall at home, initial encounter - From presumed bacteremia but has also had some deconditioning  Insulin  dependent type 2 diabetes mellitus (HCC) - A1c 7.5% on 08/06/2023 - Continue on SSI and CBG monitoring for now  Acute metabolic encephalopathy-resolved as of 08/07/2023 - In setting of severe sepsis from bacteremia - Mentation improved this morning  BPH (benign prostatic hyperplasia) - Follows with urology - Foley in place due to recent outlet obstruction  Iron deficiency anemia - Iron stores low - Start on oral iron  B12 deficiency - B12 level 266 - Continue on replacement  Sacral pressure ulcer - continue wound care as needed  Chronic diastolic CHF (congestive heart failure) (HCC) - no s/s exac - Recent echo 05/10/2023: EF 60 to 65%  Essential hypertension - No BP meds noted on med rec and currently soft blood pressure  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; 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  Hypomagnesemia - Replete as needed  Hyperlipidemia - On Lipitor  at home  GERD (gastroesophageal reflux  disease) - continue protonix    Old records reviewed in assessment of this patient  Antimicrobials: Cefepime 08/06/2023 >> 08/07/2023 Rocephin 4/27 >> current   DVT prophylaxis:   apixaban  (ELIQUIS ) tablet 5 mg    Code Status:   Code Status: Full Code  Mobility Assessment (Last 72 Hours)     Mobility Assessment     Row Name 08/06/23 2250           Does patient have an order for bedrest or is patient medically unstable No - Continue assessment       What is the highest level of mobility based on the progressive mobility assessment? Level 1 (Bedfast) - Unable to balance while sitting on edge of bed       Is the above level different from baseline mobility prior to current illness? Yes - Recommend PT order                Barriers to discharge: none Disposition Plan:  Home HH orders placed: n/a Status is: Inpt  Objective: Blood pressure 95/62, pulse 80, temperature 97.6 F (36.4 C), temperature source Oral, resp. rate 18, height 6' (1.829 m), weight 90 kg, SpO2 100%.  Examination:  Physical Exam Constitutional:      General: He is not in acute distress.    Appearance: Normal appearance.  HENT:     Head: Normocephalic and atraumatic.     Mouth/Throat:     Mouth: Mucous membranes are moist.  Eyes:     Extraocular Movements: Extraocular movements intact.  Cardiovascular:     Rate and Rhythm: Normal rate and regular rhythm.  Pulmonary:     Effort: Pulmonary effort is normal. No respiratory distress.     Breath sounds: Normal breath sounds. No wheezing.  Abdominal:     General: Bowel sounds are normal. There is no distension.     Palpations: Abdomen is soft.     Tenderness: There is no abdominal tenderness.  Genitourinary:    Comments: Foley in place Musculoskeletal:        General: Normal range of motion.     Cervical back: Normal range of motion and neck supple.  Skin:    General: Skin is warm and dry.  Neurological:     General: No focal deficit present.     Mental Status: He is alert.  Psychiatric:        Mood and Affect: Mood normal.        Behavior: Behavior normal.      Consultants:  Urology  Procedures:    Data Reviewed: Results for orders placed or  performed during the hospital encounter of 08/06/23 (from the past 24 hours)  Comprehensive metabolic panel     Status: Abnormal   Collection Time: 08/06/23  5:13 PM  Result Value Ref Range   Sodium 136 135 - 145 mmol/L   Potassium 3.8 3.5 - 5.1 mmol/L   Chloride 101 98 - 111 mmol/L   CO2 16 (L) 22 - 32 mmol/L   Glucose, Bld 190 (H) 70 - 99 mg/dL   BUN 12 8 - 23 mg/dL   Creatinine, Ser 0.98 0.61 - 1.24 mg/dL   Calcium  9.0 8.9 - 10.3 mg/dL   Total Protein 6.5 6.5 - 8.1 g/dL   Albumin 3.4 (L) 3.5 - 5.0 g/dL   AST 29 15 - 41 U/L   ALT 17 0 - 44 U/L   Alkaline Phosphatase 65 38 - 126 U/L   Total Bilirubin 1.9 (H) 0.0 -  1.2 mg/dL   GFR, Estimated >25 >36 mL/min   Anion gap 19 (H) 5 - 15  CBC with Differential     Status: Abnormal   Collection Time: 08/06/23  5:13 PM  Result Value Ref Range   WBC 9.6 4.0 - 10.5 K/uL   RBC 4.20 (L) 4.22 - 5.81 MIL/uL   Hemoglobin 11.8 (L) 13.0 - 17.0 g/dL   HCT 64.4 (L) 03.4 - 74.2 %   MCV 88.3 80.0 - 100.0 fL   MCH 28.1 26.0 - 34.0 pg   MCHC 31.8 30.0 - 36.0 g/dL   RDW 59.5 (H) 63.8 - 75.6 %   Platelets 252 150 - 400 K/uL   nRBC 0.0 0.0 - 0.2 %   Neutrophils Relative % 90 %   Neutro Abs 8.5 (H) 1.7 - 7.7 K/uL   Lymphocytes Relative 4 %   Lymphs Abs 0.4 (L) 0.7 - 4.0 K/uL   Monocytes Relative 5 %   Monocytes Absolute 0.5 0.1 - 1.0 K/uL   Eosinophils Relative 0 %   Eosinophils Absolute 0.0 0.0 - 0.5 K/uL   Basophils Relative 0 %   Basophils Absolute 0.0 0.0 - 0.1 K/uL   Immature Granulocytes 1 %   Abs Immature Granulocytes 0.07 0.00 - 0.07 K/uL  Blood Culture (routine x 2)     Status: None (Preliminary result)   Collection Time: 08/06/23  5:13 PM   Specimen: BLOOD  Result Value Ref Range   Specimen Description BLOOD RIGHT ANTECUBITAL    Special Requests      BOTTLES DRAWN AEROBIC ONLY Blood Culture results may not be optimal due to an inadequate volume of blood received in culture bottles   Culture  Setup Time      GRAM NEGATIVE  RODS AEROBIC BOTTLE ONLY CRITICAL VALUE NOTED.  VALUE IS CONSISTENT WITH PREVIOUSLY REPORTED AND CALLED VALUE. Performed at [facility], [address], [place], [place] [address]    Culture GRAM NEGATIVE RODS    Report Status PENDING   Urinalysis, w/ Reflex to Culture (Infection Suspected) -Urine, Catheterized; Indwelling urinary catheter     Status: Abnormal   Collection Time: 08/06/23  5:13 PM  Result Value Ref Range   Specimen Source URINE, CATHETERIZED    Color, Urine YELLOW YELLOW   APPearance CLOUDY (A) CLEAR   Specific Gravity, Urine 1.015 1.005 - 1.030   pH 5.0 5.0 - 8.0   Glucose, UA >=500 (A) NEGATIVE mg/dL   Hgb urine dipstick MODERATE (A) NEGATIVE   Bilirubin Urine NEGATIVE NEGATIVE   Ketones, ur 5 (A) NEGATIVE mg/dL   Protein, ur 30 (A) NEGATIVE mg/dL   Nitrite NEGATIVE NEGATIVE   Leukocytes,Ua LARGE (A) NEGATIVE   RBC / HPF 11-20 0 - 5 RBC/hpf   WBC, UA >50 0 - 5 WBC/hpf   Bacteria, UA RARE (A) NONE SEEN   Squamous Epithelial / HPF 0-5 0 - 5 /HPF   WBC Clumps PRESENT    Mucus PRESENT   Brain natriuretic peptide     Status: Abnormal   Collection Time: 08/06/23  5:13 PM  Result Value Ref Range   B Natriuretic Peptide 307.2 (H) 0.0 - 100.0 pg/mL  Urine Culture     Status: None (Preliminary result)   Collection Time: 08/06/23  5:13 PM   Specimen: Urine, Random  Result Value Ref Range   Specimen Description URINE, RANDOM    Special Requests      URINE, CATHETERIZED Performed at [facility], [address] [place], [place] [address]    Culture PENDING    Report Status PENDING   TSH     Status: None   Collection Time: 08/06/23  5:13 PM  Result Value Ref Range   TSH 0.710 0.350 - 4.500 uIU/mL  Reticulocytes     Status: Abnormal   Collection Time: 08/06/23  5:13 PM  Result Value Ref Range   Retic Ct Pct 2.2 0.4 - 3.1 %   RBC. 4.21 (L) 4.22 - 5.81 MIL/uL   Retic Count, Absolute 91.8 19.0 - 186.0 K/uL   Immature Retic Fract 30.6 (H) 2.3 - 15.9 %   Hemoglobin A1c     Status: Abnormal   Collection Time: 08/06/23  5:13 PM  Result Value Ref Range   Hgb A1c MFr Bld 7.5 (H) 4.8 - 5.6 %   Mean Plasma Glucose 168.55 mg/dL  Blood Culture (routine x 2)     Status: None (Preliminary result)   Collection Time: 08/06/23  5:26 PM   Specimen: BLOOD RIGHT HAND  Result Value Ref Range   Specimen Description BLOOD RIGHT HAND    Special Requests      BOTTLES DRAWN AEROBIC AND ANAEROBIC Blood Culture results may not be optimal due to an inadequate volume of blood received in culture bottles   Culture  Setup Time      GRAM NEGATIVE RODS ANAEROBIC BOTTLE ONLY CRITICAL RESULT CALLED TO, READ BACK BY AND VERIFIED WITH: [email or web address]  08/07/23 MK Performed at [facility], [address] [place], [place] [address]    Culture GRAM NEGATIVE RODS    Report Status PENDING   Blood Culture ID Panel (Reflexed)     Status: Abnormal   Collection Time: 08/06/23  5:26 PM  Result Value Ref Range   Enterococcus faecalis NOT DETECTED NOT DETECTED   Enterococcus Faecium NOT DETECTED NOT DETECTED   Listeria monocytogenes NOT DETECTED NOT DETECTED   Staphylococcus species NOT DETECTED NOT DETECTED   Staphylococcus aureus (BCID) NOT DETECTED NOT DETECTED   Staphylococcus epidermidis NOT DETECTED NOT DETECTED   Staphylococcus lugdunensis NOT DETECTED NOT DETECTED   Streptococcus species NOT DETECTED NOT DETECTED   Streptococcus agalactiae NOT DETECTED NOT DETECTED   Streptococcus pneumoniae NOT DETECTED NOT DETECTED   Streptococcus pyogenes NOT DETECTED NOT DETECTED   A.calcoaceticus-baumannii NOT DETECTED NOT DETECTED   Bacteroides fragilis NOT DETECTED NOT DETECTED   Enterobacterales DETECTED (A) NOT DETECTED   Enterobacter cloacae complex NOT DETECTED NOT DETECTED   Escherichia coli DETECTED (A) NOT DETECTED   Klebsiella aerogenes NOT DETECTED NOT DETECTED   Klebsiella oxytoca NOT DETECTED NOT DETECTED   Klebsiella pneumoniae NOT DETECTED NOT  DETECTED   Proteus species NOT DETECTED NOT DETECTED   Salmonella species NOT DETECTED NOT DETECTED   Serratia marcescens NOT DETECTED NOT DETECTED   Haemophilus influenzae NOT DETECTED NOT DETECTED   Neisseria meningitidis NOT DETECTED NOT DETECTED   Pseudomonas aeruginosa NOT DETECTED NOT DETECTED   Stenotrophomonas maltophilia NOT DETECTED NOT DETECTED   Candida albicans NOT DETECTED NOT DETECTED   Candida auris NOT DETECTED NOT DETECTED   Candida glabrata NOT DETECTED NOT DETECTED   Candida krusei NOT DETECTED NOT DETECTED   Candida parapsilosis NOT DETECTED NOT DETECTED   Candida tropicalis NOT DETECTED NOT DETECTED   Cryptococcus neoformans/gattii NOT DETECTED NOT DETECTED   CTX-M ESBL NOT DETECTED NOT DETECTED   Carbapenem resistance IMP NOT DETECTED NOT DETECTED   Carbapenem resistance KPC NOT DETECTED NOT DETECTED   Carbapenem resistance  NDM NOT DETECTED NOT DETECTED   Carbapenem resist OXA 48 LIKE NOT DETECTED NOT DETECTED   Carbapenem resistance VIM NOT DETECTED NOT DETECTED  I-Stat venous blood gas, ED ([facility])     Status: Abnormal   Collection Time: 08/06/23  5:38 PM  Result Value Ref Range   pH, Ven 7.413 7.25 - 7.43   pCO2, Ven 29.0 (L) 44 - 60 mmHg   pO2, Ven 66 (H) 32 - 45 mmHg   Bicarbonate 18.5 (L) 20.0 - 28.0 mmol/L   TCO2 19 (L) 22 - 32 mmol/L   O2 Saturation 93 %   Acid-base deficit 5.0 (H) 0.0 - 2.0 mmol/L   Sodium 137 135 - 145 mmol/L   Potassium 3.7 3.5 - 5.1 mmol/L   Calcium , Ion 1.04 (L) 1.15 - 1.40 mmol/L   HCT 35.0 (L) 39.0 - 52.0 %   Hemoglobin 11.9 (L) 13.0 - 17.0 g/dL   Sample type VENOUS   I-Stat Chem 8, ED     Status: Abnormal   Collection Time: 08/06/23  5:38 PM  Result Value Ref Range   Sodium 137 135 - 145 mmol/L   Potassium 3.7 3.5 - 5.1 mmol/L   Chloride 102 98 - 111 mmol/L   BUN 10 8 - 23 mg/dL   Creatinine, Ser 4.54 0.61 - 1.24 mg/dL   Glucose, Bld 098 (H) 70 - 99 mg/dL   Calcium , Ion 1.04 (L) 1.15 - 1.40 mmol/L   TCO2 18 (L) 22  - 32 mmol/L   Hemoglobin 12.6 (L) 13.0 - 17.0 g/dL   HCT 11.9 (L) 14.7 - 82.9 %  I-Stat Lactic Acid, ED     Status: Abnormal   Collection Time: 08/06/23  5:39 PM  Result Value Ref Range   Lactic Acid, Venous 7.1 (HH) 0.5 - 1.9 mmol/L   Comment NOTIFIED PHYSICIAN   Resp panel by RT-PCR (RSV, Flu A&B, Covid) Anterior Nasal Swab     Status: None   Collection Time: 08/06/23  5:53 PM   Specimen: Anterior Nasal Swab  Result Value Ref Range   SARS Coronavirus 2 by RT PCR NEGATIVE NEGATIVE   Influenza A by PCR NEGATIVE NEGATIVE   Influenza B by PCR NEGATIVE NEGATIVE   Resp Syncytial Virus by PCR NEGATIVE NEGATIVE  I-Stat Lactic Acid, ED     Status: Abnormal   Collection Time: 08/06/23  9:03 PM  Result Value Ref Range   Lactic Acid, Venous 2.7 (HH) 0.5 - 1.9 mmol/L   Comment NOTIFIED PHYSICIAN   Glucose, capillary     Status: Abnormal   Collection Time: 08/06/23 10:53 PM  Result Value Ref Range   Glucose-Capillary 178 (H) 70 - 99 mg/dL  Protime-INR     Status: Abnormal   Collection Time: 08/06/23 11:44 PM  Result Value Ref Range   Prothrombin Time 17.4 (H) 11.4 - 15.2 seconds   INR 1.4 (H) 0.8 - 1.2  Lactic acid, plasma     Status: Abnormal   Collection Time: 08/06/23 11:44 PM  Result Value Ref Range   Lactic Acid, Venous 2.1 (HH) 0.5 - 1.9 mmol/L  Ethanol     Status: None   Collection Time: 08/06/23 11:44 PM  Result Value Ref Range   Alcohol, Ethyl (B) <15 <15 mg/dL  Ammonia     Status: None   Collection Time: 08/06/23 11:44 PM  Result Value Ref Range   Ammonia 20 9 - 35 umol/L  CK     Status: None   Collection Time: 08/06/23  11:44 PM  Result Value Ref Range   Total CK 111 49 - 397 U/L  Iron and TIBC     Status: Abnormal   Collection Time: 08/06/23 11:44 PM  Result Value Ref Range   Iron 12 (L) 45 - 182 ug/dL   TIBC 161 096 - 045 ug/dL   Saturation Ratios 5 (L) 17.9 - 39.5 %   UIBC 255 ug/dL  Ferritin     Status: None   Collection Time: 08/06/23 11:44 PM  Result Value  Ref Range   Ferritin 26 24 - 336 ng/mL  Magnesium      Status: Abnormal   Collection Time: 08/06/23 11:44 PM  Result Value Ref Range   Magnesium  1.0 (L) 1.7 - 2.4 mg/dL  Procalcitonin     Status: None   Collection Time: 08/06/23 11:44 PM  Result Value Ref Range   Procalcitonin 9.53 ng/mL  Phosphorus     Status: None   Collection Time: 08/06/23 11:44 PM  Result Value Ref Range   Phosphorus 4.2 2.5 - 4.6 mg/dL  Lactic acid, plasma     Status: None   Collection Time: 08/07/23  2:08 AM  Result Value Ref Range   Lactic Acid, Venous 1.3 0.5 - 1.9 mmol/L  Prealbumin     Status: Abnormal   Collection Time: 08/07/23  2:08 AM  Result Value Ref Range   Prealbumin 10 (L) 18 - 38 mg/dL  Vitamin B12     Status: None   Collection Time: 08/07/23  2:08 AM  Result Value Ref Range   Vitamin B-12 226 180 - 914 pg/mL  Folate     Status: None   Collection Time: 08/07/23  2:08 AM  Result Value Ref Range   Folate 16.0 >5.9 ng/mL  Magnesium      Status: None   Collection Time: 08/07/23  2:08 AM  Result Value Ref Range   Magnesium  2.2 1.7 - 2.4 mg/dL  Phosphorus     Status: None   Collection Time: 08/07/23  2:08 AM  Result Value Ref Range   Phosphorus 4.4 2.5 - 4.6 mg/dL  Comprehensive metabolic panel     Status: Abnormal   Collection Time: 08/07/23  2:08 AM  Result Value Ref Range   Sodium 137 135 - 145 mmol/L   Potassium 3.7 3.5 - 5.1 mmol/L   Chloride 101 98 - 111 mmol/L   CO2 19 (L) 22 - 32 mmol/L   Glucose, Bld 186 (H) 70 - 99 mg/dL   BUN 12 8 - 23 mg/dL   Creatinine, Ser 4.09 0.61 - 1.24 mg/dL   Calcium  8.2 (L) 8.9 - 10.3 mg/dL   Total Protein 5.4 (L) 6.5 - 8.1 g/dL   Albumin 2.6 (L) 3.5 - 5.0 g/dL   AST 24 15 - 41 U/L   ALT 16 0 - 44 U/L   Alkaline Phosphatase 49 38 - 126 U/L   Total Bilirubin 1.8 (H) 0.0 - 1.2 mg/dL   GFR, Estimated >81 >19 mL/min   Anion gap 17 (H) 5 - 15  CBC     Status: Abnormal   Collection Time: 08/07/23  2:08 AM  Result Value Ref Range   WBC 13.3 (H) 4.0  - 10.5 K/uL   RBC 3.47 (L) 4.22 - 5.81 MIL/uL   Hemoglobin 9.7 (L) 13.0 - 17.0 g/dL   HCT 14.7 (L) 82.9 - 56.2 %   MCV 85.9 80.0 - 100.0 fL   MCH 28.0 26.0 - 34.0 pg   MCHC 32.6  30.0 - 36.0 g/dL   RDW 78.4 (H) 69.6 - 29.5 %   Platelets 233 150 - 400 K/uL   nRBC 0.0 0.0 - 0.2 %  Glucose, capillary     Status: Abnormal   Collection Time: 08/07/23  5:37 AM  Result Value Ref Range   Glucose-Capillary 171 (H) 70 - 99 mg/dL  Rapid urine drug screen (hospital performed)     Status: None   Collection Time: 08/07/23  6:59 AM  Result Value Ref Range   Opiates NONE DETECTED NONE DETECTED   Cocaine NONE DETECTED NONE DETECTED   Benzodiazepines NONE DETECTED NONE DETECTED   Amphetamines NONE DETECTED NONE DETECTED   Tetrahydrocannabinol NONE DETECTED NONE DETECTED   Barbiturates NONE DETECTED NONE DETECTED  Glucose, capillary     Status: Abnormal   Collection Time: 08/07/23  7:46 AM  Result Value Ref Range   Glucose-Capillary 161 (H) 70 - 99 mg/dL   Comment 1 Notify RN   Glucose, capillary     Status: Abnormal   Collection Time: 08/07/23 12:02 PM  Result Value Ref Range   Glucose-Capillary 148 (H) 70 - 99 mg/dL    I have reviewed pertinent nursing notes, vitals, labs, and images as necessary. I have ordered labwork to follow up on as indicated.  I have reviewed the last notes from staff over past 24 hours. I have discussed patient's care plan and test results with nursing staff, CM/SW, and other staff as appropriate.  Time spent: Greater than 50% of the 55 minute visit was spent in counseling/coordination of care for the patient as laid out in the A&P.   LOS: 1 day   Faith Homes, MD [facility] 08/07/2023, 12:31 PM

## 2023-08-07 NOTE — Assessment & Plan Note (Signed)
 Monitor for signs of sundowning while hospitalized

## 2023-08-08 ENCOUNTER — Ambulatory Visit: Admitting: Family Medicine

## 2023-08-08 ENCOUNTER — Inpatient Hospital Stay (HOSPITAL_COMMUNITY)

## 2023-08-08 DIAGNOSIS — R6520 Severe sepsis without septic shock: Secondary | ICD-10-CM | POA: Diagnosis not present

## 2023-08-08 DIAGNOSIS — Z515 Encounter for palliative care: Secondary | ICD-10-CM

## 2023-08-08 DIAGNOSIS — Y92009 Unspecified place in unspecified non-institutional (private) residence as the place of occurrence of the external cause: Secondary | ICD-10-CM | POA: Diagnosis not present

## 2023-08-08 DIAGNOSIS — U099 Post covid-19 condition, unspecified: Secondary | ICD-10-CM

## 2023-08-08 DIAGNOSIS — R7881 Bacteremia: Secondary | ICD-10-CM | POA: Diagnosis not present

## 2023-08-08 DIAGNOSIS — K8080 Other cholelithiasis without obstruction: Secondary | ICD-10-CM | POA: Diagnosis not present

## 2023-08-08 DIAGNOSIS — A419 Sepsis, unspecified organism: Secondary | ICD-10-CM | POA: Diagnosis not present

## 2023-08-08 DIAGNOSIS — W19XXXA Unspecified fall, initial encounter: Secondary | ICD-10-CM | POA: Diagnosis not present

## 2023-08-08 DIAGNOSIS — I69354 Hemiplegia and hemiparesis following cerebral infarction affecting left non-dominant side: Secondary | ICD-10-CM

## 2023-08-08 DIAGNOSIS — R5381 Other malaise: Secondary | ICD-10-CM | POA: Diagnosis not present

## 2023-08-08 DIAGNOSIS — G9339 Other post infection and related fatigue syndromes: Secondary | ICD-10-CM

## 2023-08-08 DIAGNOSIS — Z7189 Other specified counseling: Secondary | ICD-10-CM | POA: Diagnosis not present

## 2023-08-08 DIAGNOSIS — R338 Other retention of urine: Secondary | ICD-10-CM | POA: Diagnosis not present

## 2023-08-08 LAB — GLUCOSE, CAPILLARY
Glucose-Capillary: 147 mg/dL — ABNORMAL HIGH (ref 70–99)
Glucose-Capillary: 151 mg/dL — ABNORMAL HIGH (ref 70–99)
Glucose-Capillary: 176 mg/dL — ABNORMAL HIGH (ref 70–99)
Glucose-Capillary: 195 mg/dL — ABNORMAL HIGH (ref 70–99)

## 2023-08-08 LAB — CBC WITH DIFFERENTIAL/PLATELET
Abs Immature Granulocytes: 0.07 10*3/uL (ref 0.00–0.07)
Basophils Absolute: 0 10*3/uL (ref 0.0–0.1)
Basophils Relative: 0 %
Eosinophils Absolute: 0 10*3/uL (ref 0.0–0.5)
Eosinophils Relative: 0 %
HCT: 28.8 % — ABNORMAL LOW (ref 39.0–52.0)
Hemoglobin: 9.3 g/dL — ABNORMAL LOW (ref 13.0–17.0)
Immature Granulocytes: 1 %
Lymphocytes Relative: 9 %
Lymphs Abs: 0.7 10*3/uL (ref 0.7–4.0)
MCH: 28 pg (ref 26.0–34.0)
MCHC: 32.3 g/dL (ref 30.0–36.0)
MCV: 86.7 fL (ref 80.0–100.0)
Monocytes Absolute: 0.8 10*3/uL (ref 0.1–1.0)
Monocytes Relative: 10 %
Neutro Abs: 6.2 10*3/uL (ref 1.7–7.7)
Neutrophils Relative %: 80 %
Platelets: 239 10*3/uL (ref 150–400)
RBC: 3.32 MIL/uL — ABNORMAL LOW (ref 4.22–5.81)
RDW: 18.7 % — ABNORMAL HIGH (ref 11.5–15.5)
WBC: 7.8 10*3/uL (ref 4.0–10.5)
nRBC: 0 % (ref 0.0–0.2)

## 2023-08-08 LAB — HEPATIC FUNCTION PANEL
ALT: 16 U/L (ref 0–44)
AST: 35 U/L (ref 15–41)
Albumin: 2.6 g/dL — ABNORMAL LOW (ref 3.5–5.0)
Alkaline Phosphatase: 50 U/L (ref 38–126)
Bilirubin, Direct: 0.2 mg/dL (ref 0.0–0.2)
Indirect Bilirubin: 1 mg/dL — ABNORMAL HIGH (ref 0.3–0.9)
Total Bilirubin: 1.2 mg/dL (ref 0.0–1.2)
Total Protein: 5.8 g/dL — ABNORMAL LOW (ref 6.5–8.1)

## 2023-08-08 LAB — BASIC METABOLIC PANEL WITH GFR
Anion gap: 15 (ref 5–15)
BUN: 16 mg/dL (ref 8–23)
CO2: 20 mmol/L — ABNORMAL LOW (ref 22–32)
Calcium: 8.5 mg/dL — ABNORMAL LOW (ref 8.9–10.3)
Chloride: 104 mmol/L (ref 98–111)
Creatinine, Ser: 1.05 mg/dL (ref 0.61–1.24)
GFR, Estimated: >60 mL/min (ref >60)
Glucose, Bld: 148 mg/dL — ABNORMAL HIGH (ref 70–99)
Potassium: 3.6 mmol/L (ref 3.5–5.1)
Sodium: 139 mmol/L (ref 135–145)

## 2023-08-08 LAB — MAGNESIUM: Magnesium: 1.7 mg/dL (ref 1.7–2.4)

## 2023-08-08 MED ORDER — GERHARDT'S BUTT CREAM
TOPICAL_CREAM | Freq: Two times a day (BID) | CUTANEOUS | Status: DC
  Filled 2023-08-08: qty 60

## 2023-08-08 MED ORDER — PANTOPRAZOLE SODIUM 40 MG PO TBEC
40.0000 mg | DELAYED_RELEASE_TABLET | Freq: Every day | ORAL | Status: DC
  Administered 2023-08-08 – 2023-08-12 (×5): 40 mg via ORAL
  Filled 2023-08-08 (×5): qty 1

## 2023-08-08 MED ORDER — HEPARIN (PORCINE) 25000 UT/250ML-% IV SOLN
1250.0000 [IU]/h | INTRAVENOUS | Status: DC
  Administered 2023-08-09: 1100 [IU]/h via INTRAVENOUS
  Administered 2023-08-09: 1400 [IU]/h via INTRAVENOUS
  Filled 2023-08-08 (×2): qty 250

## 2023-08-08 MED ORDER — ORAL CARE MOUTH RINSE: 15.0000 mL | OROMUCOSAL | Status: DC | PRN

## 2023-08-08 MED ORDER — ADULT MULTIVITAMIN W/MINERALS CH
1.0000 | ORAL_TABLET | Freq: Every day | ORAL | Status: DC
  Administered 2023-08-08 – 2023-08-13 (×6): 1 via ORAL
  Filled 2023-08-08 (×6): qty 1

## 2023-08-08 MED ORDER — BOOST / RESOURCE BREEZE PO LIQD CUSTOM
1.0000 | Freq: Three times a day (TID) | ORAL | Status: DC
  Administered 2023-08-08 – 2023-08-13 (×8): 1 via ORAL

## 2023-08-08 MED ORDER — THIAMINE MONONITRATE 100 MG PO TABS
100.0000 mg | ORAL_TABLET | Freq: Every day | ORAL | Status: DC
  Administered 2023-08-08 – 2023-08-13 (×6): 100 mg via ORAL
  Filled 2023-08-08 (×6): qty 1

## 2023-08-08 MED ORDER — TECHNETIUM TC 99M MEBROFENIN IV KIT
5.5000 | PACK | Freq: Once | INTRAVENOUS | Status: AC | PRN
  Administered 2023-08-08: 5.5 via INTRAVENOUS

## 2023-08-08 NOTE — Progress Notes (Signed)
 Inpatient Rehab Coordinator Note:  I met with patient at bedside to discuss CIR recommendations and goals/expectations of CIR stay.  We reviewed 3 hrs/day of therapy, physician follow up, and average length of stay 2 weeks (dependent upon progress) with goals of mod I. Patient was at [facility] back in January, he is interested in returning and if not approved he will go home. Will continue to follow for potential admission to CIR.   Rehab Admissons Coordinator Beatrix Breece, [place], [place] [phone]

## 2023-08-08 NOTE — Consult Note (Signed)
 Physical Medicine and Rehabilitation Consult Reason for Consult: Weakness and falls Referring Physician: Girguis   HPI: John [name] is a 78 y.o. 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  Patient was brought to the emergency room on 08/07/2023 and was found to be in severe urosepsis.  He was started on empiric antibiotics.  Urology was consulted.  He was also found to have cholelithiasis with gallbladder sludge and mild wall thickening on right upper quadrant ultrasound.  Urology elected to continue with his current Foley catheter with follow-up as an outpatient as planned.  Patient demonstrating some improvement in his cognitive function.  Patient was up with therapies yesterday and was contact-guard assist for sit to stand transfers.  He took short steps to the recliner with a rolling walker with what appears to be contact-guard assistance as well.  Patient was self-limiting with therapy during the initial session.  Patient was minimal assistance to contact-guard assistance with ADLs.  Asked the patient how he was moving prior to this weekend and he stated he walked with and sometimes without his walker.  He uses walker because it makes his wife feel better.  He lives on the first floor of a a two-level home with 3 steps to enter.  He does not climb the stairs within the house.    Home: Home Living Family/patient expects to be discharged to:: Private residence Living Arrangements: Spouse/significant other Available Help at Discharge: Family, Available 24 hours/day Type of Home: House Home Access: Stairs to enter [employer] of Steps: 3 Entrance Stairs-Rails: Right, Left, Can reach both Home Layout: Two level, Able to live on main level with bedroom/bathroom Alternate Level  Stairs-Number of Steps: 14 Alternate Level Stairs-Rails: Right, Left, Can reach both Bathroom Shower/Tub: Walk-in shower, Curtain Bathroom Toilet: Handicapped height Bathroom Accessibility: No Home Equipment: Agricultural consultant (2 wheels), BSC/3in1, Information systems manager, Retail buyer  Lives With: Spouse  Functional History: Prior Function Prior Level of Function : Needs assist, History of Falls (last six months) Physical Assist : ADLs (physical), Mobility (physical) Mobility (physical): Gait, Stairs ADLs (physical): Bathing, Dressing, IADLs Mobility Comments: Pt reports his wife makes him use a RW and if she is not present he doesn't use an AD. Pt stated he hasn't been upstairs in 3 months as his wife won't let him go up there, but also reports he "snuck upstairs" when wife wasn't there to use upstairs shower. Pt has had at least 4 falls in the last 72mo d/t LOB and feeling dizzy. Pt reports sitting in a recliner chair often. (Per chart review pt d/c from CIR 1/26 at CGA/SBA level for transfers and ambulation with RW.) ADLs Comments: Pt Independent with self-feeding and grooming. Pt reports bathing, dressing, and toileting with Supervision from wife for safety. Pt also reports use of shower chair, which he reports he dislikes but his wife "makes me use it." Pt's wife assists with IADLs. Pt is a retired Passenger transport manager. (Per chart review pt d/c from CIR 1/26 at CGA/SBA level for transfers and ambulation with RW.) Functional Status:  Mobility: Bed Mobility Overal bed mobility: Needs Assistance Bed Mobility: Supine to Sit Supine to sit: Contact guard, +2 for safety/equipment, HOB elevated, Used rails General bed mobility comments: Pt sat up on R side of bed, bringing BLE off EOB,  and pushing up to reach seated position. CGA at trunk for safety and +2 assist for line management. Transfers Overall transfer level: Needs assistance Equipment used: Rolling walker (2 wheels) Transfers: Sit to/from Stand,  Bed to chair/wheelchair/BSC Sit to Stand: Contact guard assist, +2 physical assistance, +2 safety/equipment Bed to/from chair/wheelchair/BSC transfer type:: Step pivot Step pivot transfers: Contact guard assist, +2 safety/equipment General transfer comment: Pt stood from lowest bed height. He attempted without physical assist and was unable to power up completely, but did clear his hips. VC for proper hand placement on RA and CGA x2 to power up with slightly increased time to reach erect posture. Pt transferred to recliner chair on his R by taking short slow steps, pivoting RW, and maintainnig body inside AD at all times.Good eccentric control with sitting. Ambulation/Gait General Gait Details: Deferred as pt stated "this is all I think I can do today."    ADL: ADL Overall ADL's : Needs assistance/impaired Eating/Feeding: Set up, Sitting Grooming: Set up, Sitting Upper Body Bathing: Set up, Supervision/ safety, Sitting Lower Body Bathing: Minimal assistance, Cueing for safety, Cueing for compensatory techniques, Sit to/from stand Upper Body Dressing : Contact guard assist, Sitting (assist for line management) Lower Body Dressing: Minimal assistance, Sit to/from stand, Cueing for safety, Cueing for compensatory techniques Toilet Transfer: Contact guard assist, +2 for safety/equipment, BSC/3in1, Rolling walker (2 wheels), Cueing for safety (step-pivot transfer; cues for hand placement) Toilet Transfer Details (indicate cue type and reason): simulated bed to chair Toileting- Clothing Manipulation and Hygiene: Minimal assistance, Sit to/from stand, Cueing for safety, Cueing for compensatory techniques Functional mobility during ADLs:  (deferred due to episode of asymptomatic orthostatic hypotension) General ADL Comments: Pt with mildly decreased activity tolerance  Cognition: Cognition Orientation Level: Oriented to person, Disoriented to time, Disoriented to place, Disoriented to  situation Cognition Arousal: Alert Behavior During Therapy: [facility] for tasks assessed/performed   Review of Systems  Constitutional:  Positive for fever.  HENT: Negative.    Eyes: Negative.   Respiratory: Negative.    Cardiovascular: Negative.   Gastrointestinal: Negative.   Genitourinary:  Positive for dysuria and frequency.       Foley  Musculoskeletal: Negative.   Skin: Negative.   Neurological:  Positive for focal weakness and weakness.  Psychiatric/Behavioral: Negative.     Past Medical History:  Diagnosis Date   BPH (benign prostatic hyperplasia)    CTS (carpal tunnel syndrome)    Depression    Diabetes mellitus    Essential hypertension    GERD (gastroesophageal reflux disease)    History of breast cancer    Hypercholesteremia    Neuropathy    Past Surgical History:  Procedure Laterality Date   CATARACT EXTRACTION, BILATERAL  01/18/2017   LITHOTRIPSY     MASTECTOMY Right 05/02/2018   PACEMAKER IMPLANT N/A 05/13/2023   Procedure: PACEMAKER IMPLANT;  Surgeon: Ardeen Kohler, MD;  Location: [facility] INVASIVE CV LAB;  Service: Cardiovascular;  Laterality: N/A;   TEMPORARY PACEMAKER N/A 05/11/2023   Procedure: TEMPORARY PACEMAKER;  Surgeon: Swazil[place], Peter M, MD;  Location: [facility] INVASIVE CV LAB;  Service: Cardiovascular;  Laterality: N/A;   TONSILLECTOMY AND ADENOIDECTOMY     Family History  Problem Relation Age of Onset   Lymphoma Mother    Cancer Mother    Diabetes Father    Stroke Father    Ovarian cancer Sister    Cancer Sister    Cancer Paternal Grandmother    Social History:  reports that he has never smoked.  He has never used smokeless tobacco. He reports that he does not drink alcohol and does not use drugs. Allergies:  Allergies  Allergen Reactions   Ramipril Anaphylaxis   Other Diarrhea    Severe intolerance to Chemotherapy in the past.   Sertraline Other (See Comments)    "Extreme headaches"   Adhesive [Tape] Rash   Medications Prior to Admission   Medication Sig Dispense Refill   acetaminophen  (TYLENOL ) 650 MG CR tablet Take 1 tablet (650 mg total) by mouth every 8 (eight) hours as needed for pain.     apixaban  (ELIQUIS ) 5 MG TABS tablet Take 1 tablet (5 mg total) by mouth 2 (two) times daily. 180 tablet 1   Ascorbic Acid  (VITAMIN C) 1000 MG tablet Take 1,000 mg by mouth 3 (three) times a week.     atorvastatin  (LIPITOR ) 80 MG tablet Take 1 tablet (80 mg total) by mouth at bedtime. 90 tablet 3   buPROPion  (WELLBUTRIN ) 75 MG tablet Take 1 tablet (75 mg total) by mouth 2 (two) times daily. 180 tablet 1   clotrimazole -betamethasone  (LOTRISONE ) cream Apply 1 Application topically daily. 30 g 0   empagliflozin  (JARDIANCE ) 25 MG TABS tablet Take 1 tablet (25 mg total) by mouth daily. 30 tablet 2   finasteride  (PROSCAR ) 5 MG tablet Take 1 tablet (5 mg total) by mouth daily. 30 tablet 0   metFORMIN  (GLUCOPHAGE -XR) 500 MG 24 hr tablet Take 2 tablets (1,000 mg total) by mouth 2 (two) times daily with a meal. 120 tablet 0   Multiple Vitamin (MULTIVITAMIN) tablet Take 1 tablet by mouth 2 (two) times a week.     pantoprazole  (PROTONIX ) 40 MG tablet Take 1 tablet (40 mg total) by mouth daily. (Take in place of omeprazole  while taking clopidogrel .) 90 tablet 1   polyethylene glycol (MIRALAX  / GLYCOLAX ) 17 g packet Take 17 g by mouth 2 (two) times daily. (Patient taking differently: Take 17 g by mouth 2 (two) times daily as needed for moderate constipation.) 14 each 0   promethazine  (PHENERGAN ) 25 MG tablet Take 0.5-1 tablets (12.5-25 mg total) by mouth every 8 (eight) hours as needed for nausea or vomiting. 20 tablet 0   Triamcinolone  Acetonide (TRIAMCINOLONE  0.1 % CREAM : EUCERIN) CREA Apply 1 Application topically 2 (two) times daily. 1 each 0   Blood Glucose Monitoring Suppl (ACCU-CHEK GUIDE) w/Device KIT Use daily to check blood sugar.  DX E11.69 1 kit 0   Lancets Misc. (ACCU-CHEK SOFTCLIX LANCET DEV) KIT Use daily to check blood sugar.  DX E11.69      tamsulosin  (FLOMAX ) 0.4 MG CAPS capsule Take 1 capsule (0.4 mg total) by mouth daily. (Patient not taking: Reported on 08/07/2023) 30 capsule 0     Blood pressure 125/61, pulse 85, temperature 98.5 F (36.9 C), temperature source Oral, resp. rate 18, height 6' (1.829 m), weight 91.7 kg, SpO2 97%. Physical Exam Constitutional:      Appearance: He is obese.  HENT:     Head: Normocephalic.     Right Ear: External ear normal.     Left Ear: External ear normal.     Nose: Nose normal.     Mouth/Throat:     Mouth: Mucous membranes are moist.  Eyes:     Extraocular Movements: Extraocular movements intact.     Pupils: Pupils are equal, round, and reactive to light.  Cardiovascular:     Rate and Rhythm: Normal rate.  Pulmonary:     Effort: Pulmonary effort is normal.  Abdominal:     Palpations: Abdomen is soft.  Musculoskeletal:        General: No swelling or tenderness. Normal range of motion.     Cervical back: Normal range of motion.  Skin:    General: Skin is warm.  Neurological:     Mental Status: He is alert.     Comments: Alert and oriented x 3. Normal insight and awareness. Intact Memory. Normal language and speech. Cranial nerve exam unremarkable. MMT: RUE 5/5. LUE grossly 4 to 4+/5 with mild PD. RLE 4-5/5. LLE 4-/ to 4+/5 prox to distal. Sensory exam normal for light touch and pain in all 4 limbs. No limb ataxia or cerebellar signs. No abnormal tone appreciated.  DTR's 2+    Psychiatric:        Mood and Affect: Mood normal.        Behavior: Behavior normal.     Results for orders placed or performed during the hospital encounter of 08/06/23 (from the past 24 hours)  Glucose, capillary     Status: Abnormal   Collection Time: 08/07/23  4:10 PM  Result Value Ref Range   Glucose-Capillary 152 (H) 70 - 99 mg/dL  Glucose, capillary     Status: Abnormal   Collection Time: 08/07/23  8:50 PM  Result Value Ref Range   Glucose-Capillary 202 (H) 70 - 99 mg/dL  Glucose, capillary      Status: Abnormal   Collection Time: 08/07/23  9:59 PM  Result Value Ref Range   Glucose-Capillary 178 (H) 70 - 99 mg/dL  Basic metabolic panel with GFR     Status: Abnormal   Collection Time: 08/08/23  5:31 AM  Result Value Ref Range   Sodium 139 135 - 145 mmol/L   Potassium 3.6 3.5 - 5.1 mmol/L   Chloride 104 98 - 111 mmol/L   CO2 20 (L) 22 - 32 mmol/L   Glucose, Bld 148 (H) 70 - 99 mg/dL   BUN 16 8 - 23 mg/dL   Creatinine, Ser 1.61 0.61 - 1.24 mg/dL   Calcium  8.5 (L) 8.9 - 10.3 mg/dL   GFR, Estimated >09 >60 mL/min   Anion gap 15 5 - 15  Magnesium      Status: None   Collection Time: 08/08/23  5:31 AM  Result Value Ref Range   Magnesium  1.7 1.7 - 2.4 mg/dL  Glucose, capillary     Status: Abnormal   Collection Time: 08/08/23  8:07 AM  Result Value Ref Range   Glucose-Capillary 147 (H) 70 - 99 mg/dL  CBC with Differential/Platelet     Status: Abnormal   Collection Time: 08/08/23  8:22 AM  Result Value Ref Range   WBC 7.8 4.0 - 10.5 K/uL   RBC 3.32 (L) 4.22 - 5.81 MIL/uL   Hemoglobin 9.3 (L) 13.0 - 17.0 g/dL   HCT 45.4 (L) 09.8 - 11.9 %   MCV 86.7 80.0 - 100.0 fL   MCH 28.0 26.0 - 34.0 pg   MCHC 32.3 30.0 - 36.0 g/dL   RDW 14.7 (H) 82.9 - 56.2 %   Platelets 239 150 - 400 K/uL   nRBC 0.0 0.0 - 0.2 %   Neutrophils Relative % 80 %   Neutro Abs 6.2 1.7 - 7.7 K/uL   Lymphocytes Relative 9 %   Lymphs Abs 0.7 0.7 - 4.0 K/uL   Monocytes Relative 10 %   Monocytes Absolute 0.8 0.1 - 1.0 K/uL   Eosinophils Relative 0 %   Eosinophils Absolute 0.0  0.0 - 0.5 K/uL   Basophils Relative 0 %   Basophils Absolute 0.0 0.0 - 0.1 K/uL   Immature Granulocytes 1 %   Abs Immature Granulocytes 0.07 0.00 - 0.07 K/uL  Glucose, capillary     Status: Abnormal   Collection Time: 08/08/23  1:18 PM  Result Value Ref Range   Glucose-Capillary 151 (H) 70 - 99 mg/dL   NM Hepato W/EF Result Date: 08/08/2023 CLINICAL DATA:  Vomiting, concern for cholecystitis. EXAM: NUCLEAR MEDICINE HEPATOBILIARY  IMAGING WITH GALLBLADDER EF TECHNIQUE: Sequential images of the abdomen were obtained out to 60 minutes following intravenous administration of radiopharmaceutical. After oral ingestion of Ensure, gallbladder ejection fraction was determined. At 60 min, normal ejection fraction is greater than 33%. RADIOPHARMACEUTICALS:  5.5 mCi Tc-51m  Choletec IV COMPARISON:  Ultrasound August 07, 2023 FINDINGS: Prompt uptake and biliary excretion of activity by the liver is seen. Gallbladder activity is visualized, consistent with patency of cystic duct. Biliary activity passes into small bowel, consistent with patent common bile duct. Calculated gallbladder ejection fraction is 4%. (Normal gallbladder ejection fraction with Ensure is greater than 33% and less than 80%.) IMPRESSION: Reduced gallbladder ejection fraction as can be seen with chronic cholecystitis/biliary dyskinesia. Electronically Signed   By: Tama Fails M.D.   On: 08/08/2023 15:18   US  Abdomen Limited RUQ (LIVER/GB) Result Date: 08/07/2023 CLINICAL DATA:  Sepsis. EXAM: ULTRASOUND ABDOMEN LIMITED RIGHT UPPER QUADRANT COMPARISON:  CT AP from 08/06/2023 FINDINGS: Gallbladder: There are multiple stones with sludge within the gallbladder. These measure up to 1 cm. The gallbladder wall thickness measures 3.2 mm. Negative sonographic Murphy's sign. No pericholecystic fluid. Common bile duct: Diameter: 3.1 mm.  No intrahepatic bile duct dilatation. Liver: No focal lesion identified. Within normal limits in parenchymal echogenicity. Portal vein is patent on color Doppler imaging with normal direction of blood flow towards the liver. Other: None. IMPRESSION: Cholelithiasis and gallbladder sludge. Mild gallbladder wall thickening. Negative sonographic Murphy's sign. Findings are equivocal for cholecystitis. If there is a high clinical suspicion for acute cholecystitis consider further evaluation with nuclear medicine HIDA scan. Electronically Signed   By: Kimberley Penman M.D.   On: 08/07/2023 07:50   CT ABDOMEN PELVIS W CONTRAST Result Date: 08/06/2023 CLINICAL DATA:  Abdominal pain, sepsis EXAM: CT ABDOMEN AND PELVIS WITH CONTRAST TECHNIQUE: Multidetector CT imaging of the abdomen and pelvis was performed using the standard protocol following bolus administration of intravenous contrast. RADIATION DOSE REDUCTION: This exam was performed according to the departmental dose-optimization program which includes automated exposure control, adjustment of the mA and/or kV according to patient size and/or use of iterative reconstruction technique. CONTRAST:  75mL OMNIPAQUE  IOHEXOL  350 MG/ML SOLN COMPARISON:  08/29/2020 FINDINGS: Lower chest: Bibasilar dependent hypoventilatory changes. No acute pleural or parenchymal lung disease. Hepatobiliary: Marked gallbladder distension, with multiple calcified gallstones layering dependently. No gallbladder wall thickening or pericholecystic fluid to suggest cholecystitis. Small amount of free fluid along the dome of the liver. Liver parenchyma is unremarkable. No biliary duct dilation. Pancreas: Unremarkable. No pancreatic ductal dilatation or surrounding inflammatory changes. Spleen: Normal in size without focal abnormality. Adrenals/Urinary Tract: Kidneys enhance normally and symmetrically. No urinary tract calculi or obstructive uropathy within either kidney. Stable nodular thickening of the adrenal glands. Bladder is decompressed with a Foley catheter, limiting its evaluation. Stomach/Bowel: No bowel obstruction or ileus. Normal appendix right lower quadrant. No bowel wall thickening or inflammatory change. Vascular/Lymphatic: Aortic atherosclerosis. No enlarged abdominal or pelvic lymph nodes. Reproductive: Prostate is unremarkable. Other:  Small amount of fluid along the dome of the liver. No free intraperitoneal gas. No abdominal wall hernia. Musculoskeletal: No acute or destructive bony abnormalities. Reconstructed images demonstrate  no additional findings. IMPRESSION: 1. Distended gallbladder, with multiple calcified gallstones layering dependently. No CT evidence of acute cholecystitis. 2. Trace free fluid along the dome of the liver, nonspecific. 3.  Aortic Atherosclerosis (ICD10-I70.0). Electronically Signed   By: Bobbye Burrow M.D.   On: 08/06/2023 23:20   DG Chest Port 1 View Result Date: 08/06/2023 CLINICAL DATA:  Questionable sepsis.  Evaluate abnormality. EXAM: PORTABLE CHEST 1 VIEW COMPARISON:  Two-view chest x-ray 05/14/2023 FINDINGS: The heart size is normal. Pacing wires are stable. Mild pulmonary vascular congestion is present. Linear atelectasis is present in the right midlung. The lungs are otherwise clear. The visualized soft tissues and bony thorax are unremarkable. IMPRESSION: 1. Mild pulmonary vascular congestion. 2. Linear atelectasis in the right midlung. Electronically Signed   By: Audree Leas M.D.   On: 08/06/2023 18:50   CT Head Wo Contrast Result Date: 08/06/2023 CLINICAL DATA:  Head trauma. Ground level fall last night. Patient's wife was concerned for a stroke last night. Patient woke up more confused today with vomiting. EXAM: CT HEAD WITHOUT CONTRAST TECHNIQUE: Contiguous axial images were obtained from the base of the skull through the vertex without intravenous contrast. RADIATION DOSE REDUCTION: This exam was performed according to the departmental dose-optimization program which includes automated exposure control, adjustment of the mA and/or kV according to patient size and/or use of iterative reconstruction technique. COMPARISON:  CT head without contrast 05/08/2023. MR head without contrast/26/25. FINDINGS: Brain: Remote lacunar infarcts are again noted within the thalami bilaterally in the left caudate head. A remote lacunar infarct is Vascular: Acute dilation of the left lateral ventricle is noted. 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  No acute infarct, hemorrhage, or mass lesion is present. No significant extraaxial fluid collection is present. The brainstem and cerebellum are within normal limits. Midline structures are within normal limits. Skull: Calvarium is intact. No focal lytic or blastic lesions are present. No significant extracranial soft tissue lesion is present. Sinuses/Orbits: The paranasal sinuses and mastoid air cells are clear. Bilateral lens replacements are noted. Globes and orbits are otherwise unremarkable. IMPRESSION: 1. No acute intracranial abnormality or significant interval change. 2. 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 3. 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 Electronically Signed   By: Audree Leas M.D.   On: 08/06/2023 18:49    Assessment/Plan: Diagnosis: 78 year old male with prior CVA with mild residual left hemiparesis now suffering from debility after urosepsis Does the need for close, 24 hr/day medical supervision in concert with the patient's rehab needs make it unreasonable for this patient to be served in a less intensive setting? Yes and Potentially Co-Morbidities requiring supervision/potential complications:  - Cholelithiasis -BPH/Bladder outlet obstruction with Foley -Infectious disease considerations -Insulin  requiring diabetes -Sacral pressure ulcer -Chronic diastolic congestive heart failure -Hypertension Due to bladder management, bowel management, safety, skin/wound care, disease management, medication administration, pain management, and patient education, does the patient require 24 hr/day rehab nursing? Yes and Potentially Does the patient require coordinated care of a physician, rehab nurse, therapy disciplines of PT, OT to address physical and functional deficits in the context of the above medical diagnosis(es)? Yes Addressing deficits in the following areas: balance,  endurance, locomotion, strength, transferring, bowel/bladder control, bathing, dressing, feeding, grooming,  toileting, and psychosocial support Can the patient actively participate in an intensive therapy program of at least 3 hrs of therapy per day at least 5 days per week? Yes The potential for patient to make measurable gains while on inpatient rehab is  potentially good Anticipated functional outcomes upon discharge from inpatient rehab are modified independent  with PT, modified independent with OT, n/a with SLP. Estimated rehab length of stay to reach the above functional goals is: see below Anticipated discharge destination: Home Overall Rehab/Functional Prognosis: excellent  POST ACUTE RECOMMENDATIONS: This patient's condition is appropriate for continued rehabilitative care in the following setting:  see below Patient has agreed to participate in recommended program. Yes Note that insurance prior authorization may be required for reimbursement for recommended care.  Comment: It's hospital day two, and he has been up with therapy only once. He is actively being treated for his infection.  If he continues to display functional need for inpatient rehab beyond today, we could explore a brief inpatient rehab admit. Rehab Admissions Coordinator to follow up.     I have personally performed a face to face diagnostic evaluation of this patient. Additionally, I have examined the patient's medical record including any pertinent labs and radiographic images.    Thanks,  Rawland Caddy, MD 08/08/2023

## 2023-08-08 NOTE — Progress Notes (Signed)
 PHARMACY - ANTICOAGULATION CONSULT NOTE  Pharmacy Consult for IV heparin  Indication: hx of pulmonary embolus  Allergies  Allergen Reactions   Ramipril Anaphylaxis   Other Diarrhea    Severe intolerance to Chemotherapy in the past.   Sertraline Other (See Comments)    "Extreme headaches"   Adhesive [Tape] Rash    Patient Measurements: Height: 6' (182.9 cm) Weight: 91.7 kg (202 lb 2.6 oz) IBW/kg (Calculated) : 77.6 HEPARIN  DW (KG): 90  Vital Signs: Temp: 98.5 F (36.9 C) (04/28 1318) Temp Source: Oral (04/28 1318) BP: 120/63 (04/28 1632) Pulse Rate: 81 (04/28 1632)  Labs: Recent Labs    08/06/23 1713 08/06/23 1738 08/06/23 2344 08/07/23 0208 08/08/23 0531 08/08/23 0822  HGB 11.8* 11.9*  12.6*  --  9.7*  --  9.3*  HCT 37.1* 35.0*  37.0*  --  29.8*  --  28.8*  PLT 252  --   --  233  --  239  LABPROT  --   --  17.4*  --   --   --   INR  --   --  1.4*  --   --   --   CREATININE 1.11 0.80  --  1.02 1.05  --   CKTOTAL  --   --  111  --   --   --     Estimated Creatinine Clearance: 64.7 mL/min (by C-G formula based on SCr of 1.05 mg/dL).   Medical History: Past Medical History:  Diagnosis Date   BPH (benign prostatic hyperplasia)    CTS (carpal tunnel syndrome)    Depression    Diabetes mellitus    Essential hypertension    GERD (gastroesophageal reflux disease)    History of breast cancer    Hypercholesteremia    Neuropathy     Medications:  Infusions:   cefTRIAXone (ROCEPHIN)  IV Stopped (08/08/23 1346)   [START ON 08/09/2023] heparin       Assessment: 78 yo male on chronic Eliquis  for hx PE, pharmacy asked to start IV Heparin  while Eliquis  is on hold for possible procedures.  Last dose given today at 1338 PM.  Goal of Therapy:  Heparin  level 0.3-0.7 units/ml, aPTT 66-102 seconds Monitor platelets by anticoagulation protocol: Yes   Plan:  Will start IV heparin  at 1100 units/hr without bolus at 1 AM 4/29. Check aPTT and heparin  level 8 hrs after  gtt starts Daily heparin  level, aPTT and CBC.  Joanell Mowers, Davey Erp, BCCP Clinical Pharmacist  08/08/2023 5:00 PM   [facility] pharmacy phone numbers are listed on [email or web address]

## 2023-08-08 NOTE — Consult Note (Signed)
 Full consult to follow -discussed with Dr. 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  Patient with several week history of postprandial nausea and vomiting.  We will formally consult tomorrow- please hold Eliquis  now; okay for heparin  drip if needed.  Okay to continue diet as tolerated for tonight.

## 2023-08-08 NOTE — Progress Notes (Signed)
 Inpatient Rehab Admissions Coordinator:   Shelah Derry to meet with patient, he is off the unit. Will re-attempt later today.   Rehab Admissons Coordinator Rosella Crandell, [place], [place] [phone]

## 2023-08-08 NOTE — Progress Notes (Signed)
 Subjective: First time meeting John [name]. NAEON. He was somewhat agitated and he expressed that he didn't want to see APP's in the clinic.   Objective: Vital signs in last 24 hours: Temp:  [97.6 F (36.4 C)-100.3 F (37.9 C)] 98.8 F (37.1 C) (04/28 0808) Pulse Rate:  [76-99] 90 (04/28 0808) Resp:  [15-25] 20 (04/28 0808) BP: (95-134)/(59-67) 121/59 (04/28 0808) SpO2:  [92 %-100 %] 98 % (04/28 0808) Weight:  [91.7 kg] 91.7 kg (04/28 0400)  Assessment/Plan: #urinary retention #e.coli bacteremia #presumed UTI  Acute urinary retention following CVA 07/13/23. Failed TOV.  Admitted 4/26 after ground level fall.  Keep present foley in place. Will be removed for UDS in clinic 08/16/23. Keep  this appt.  Continue flomax /finasteride .  WBC and SCr WNL.  BC E. Coli (+), UC w/ GNP.  Urology will sign off. Please call with questions.    Intake/Output from previous day: 04/27 0701 - 04/28 0700 In: 1044.7 [P.O.:480; I.V.:464.7; IV Piggyback:100] Out: 1600 [Urine:1600]  Intake/Output this shift: Total I/O In: 271.2 [I.V.:271.2] Out: -   Physical Exam:  General: Alert and oriented CV: No cyanosis Lungs: equal chest rise Abdomen: Soft, NTND, no rebound or guarding Gu: foley in place draining clear yellow urine  Lab Results: Recent Labs    08/06/23 1738 08/07/23 0208 08/08/23 0822  HGB 11.9*  12.6* 9.7* 9.3*  HCT 35.0*  37.0* 29.8* 28.8*   BMET Recent Labs    08/07/23 0208 08/08/23 0531  NA 137 139  K 3.7 3.6  CL 101 104  CO2 19* 20*  GLUCOSE 186* 148*  BUN 12 16  CREATININE 1.02 1.05  CALCIUM  8.2* 8.5*     Studies/Results: US  Abdomen Limited RUQ (LIVER/GB) Result Date: 08/07/2023 CLINICAL DATA:  Sepsis. EXAM: ULTRASOUND ABDOMEN LIMITED RIGHT UPPER QUADRANT COMPARISON:  CT AP from 08/06/2023 FINDINGS: Gallbladder: There are multiple stones with sludge within the gallbladder. These measure up to 1 cm. The gallbladder wall thickness measures 3.2 mm. Negative  sonographic Murphy's sign. No pericholecystic fluid. Common bile duct: Diameter: 3.1 mm.  No intrahepatic bile duct dilatation. Liver: No focal lesion identified. Within normal limits in parenchymal echogenicity. Portal vein is patent on color Doppler imaging with normal direction of blood flow towards the liver. Other: None. IMPRESSION: Cholelithiasis and gallbladder sludge. Mild gallbladder wall thickening. Negative sonographic Murphy's sign. Findings are equivocal for cholecystitis. If there is a high clinical suspicion for acute cholecystitis consider further evaluation with nuclear medicine HIDA scan. Electronically Signed   By: Kimberley Penman M.D.   On: 08/07/2023 07:50   CT ABDOMEN PELVIS W CONTRAST Result Date: 08/06/2023 CLINICAL DATA:  Abdominal pain, sepsis EXAM: CT ABDOMEN AND PELVIS WITH CONTRAST TECHNIQUE: Multidetector CT imaging of the abdomen and pelvis was performed using the standard protocol following bolus administration of intravenous contrast. RADIATION DOSE REDUCTION: This exam was performed according to the departmental dose-optimization program which includes automated exposure control, adjustment of the mA and/or kV according to patient size and/or use of iterative reconstruction technique. CONTRAST:  75mL OMNIPAQUE  IOHEXOL  350 MG/ML SOLN COMPARISON:  08/29/2020 FINDINGS: Lower chest: Bibasilar dependent hypoventilatory changes. No acute pleural or parenchymal lung disease. Hepatobiliary: Marked gallbladder distension, with multiple calcified gallstones layering dependently. No gallbladder wall thickening or pericholecystic fluid to suggest cholecystitis. Small amount of free fluid along the dome of the liver. Liver parenchyma is unremarkable. No biliary duct dilation. Pancreas: Unremarkable. No pancreatic ductal dilatation or surrounding inflammatory changes. Spleen: Normal in size without focal  abnormality. Adrenals/Urinary Tract: Kidneys enhance normally and symmetrically. No urinary  tract calculi or obstructive uropathy within either kidney. Stable nodular thickening of the adrenal glands. Bladder is decompressed with a Foley catheter, limiting its evaluation. Stomach/Bowel: No bowel obstruction or ileus. Normal appendix right lower quadrant. No bowel wall thickening or inflammatory change. Vascular/Lymphatic: Aortic atherosclerosis. No enlarged abdominal or pelvic lymph nodes. Reproductive: Prostate is unremarkable. Other: Small amount of fluid along the dome of the liver. No free intraperitoneal gas. No abdominal wall hernia. Musculoskeletal: No acute or destructive bony abnormalities. Reconstructed images demonstrate no additional findings. IMPRESSION: 1. Distended gallbladder, with multiple calcified gallstones layering dependently. No CT evidence of acute cholecystitis. 2. Trace free fluid along the dome of the liver, nonspecific. 3.  Aortic Atherosclerosis (ICD10-I70.0). Electronically Signed   By: Bobbye Burrow M.D.   On: 08/06/2023 23:20   DG Chest Port 1 View Result Date: 08/06/2023 CLINICAL DATA:  Questionable sepsis.  Evaluate abnormality. EXAM: PORTABLE CHEST 1 VIEW COMPARISON:  Two-view chest x-ray 05/14/2023 FINDINGS: The heart size is normal. Pacing wires are stable. Mild pulmonary vascular congestion is present. Linear atelectasis is present in the right midlung. The lungs are otherwise clear. The visualized soft tissues and bony thorax are unremarkable. IMPRESSION: 1. Mild pulmonary vascular congestion. 2. Linear atelectasis in the right midlung. Electronically Signed   By: Audree Leas M.D.   On: 08/06/2023 18:50   CT Head Wo Contrast Result Date: 08/06/2023 CLINICAL DATA:  Head trauma. Ground level fall last night. Patient's wife was concerned for a stroke last night. Patient woke up more confused today with vomiting. EXAM: CT HEAD WITHOUT CONTRAST TECHNIQUE: Contiguous axial images were obtained from the base of the skull through the vertex without intravenous  contrast. RADIATION DOSE REDUCTION: This exam was performed according to the departmental dose-optimization program which includes automated exposure control, adjustment of the mA and/or kV according to patient size and/or use of iterative reconstruction technique. COMPARISON:  CT head without contrast 05/08/2023. John head without contrast/26/25. FINDINGS: Brain: Remote lacunar infarcts are again noted within the thalami bilaterally in the left caudate head. A remote lacunar infarct is Vascular: Acute dilation of the left lateral ventricle is noted. 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 No acute infarct, hemorrhage, or mass lesion is present. No significant extraaxial fluid collection is present. The brainstem and cerebellum are within normal limits. Midline structures are within normal limits. Skull: Calvarium is intact. No focal lytic or blastic lesions are present. No significant extracranial soft tissue lesion is present. Sinuses/Orbits: The paranasal sinuses and mastoid air cells are clear. Bilateral lens replacements are noted. Globes and orbits are otherwise unremarkable. IMPRESSION: 1. No acute intracranial abnormality or significant interval change. 2. 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 3. 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 Electronically Signed   By: Audree Leas M.D.   On: 08/06/2023 18:49      LOS: 2 days   Alla Ar, NP [facility] Pager: [phone]  08/08/2023, 10:36 AM

## 2023-08-08 NOTE — Progress Notes (Signed)
 Progress Note    John [name]   ZOX:[id number]  DOB: 10/03/1945  DOA: 08/06/2023     2 PCP: Jobe Mulder, DO  Initial CC: weakness, falls  Hospital Course: John [name] is a 78 year old male with PMH BPH s/p Foley placement, depression, HTN, DM II, HLD, neuropathy who presented with worsening weakness, lethargy, and recurrent falls at home. He recently had Foley catheter placement by urology on 07/13/2023 in the setting of BPH and outlet obstruction.  Due to the persistent weakness, he presented for further evaluation.  Urinalysis was concerning for signs of infection.  Blood cultures were drawn and he was admitted for further workup and monitoring.  Interval History:  No events overnight.  Denied any vomiting since seen yesterday.  Getting HIDA scan today.  Urology recommended to leave Foley in place without need for exchange at this time.  Assessment and Plan: * Severe sepsis (HCC) - Tachycardia, tachypnea, leukocytosis, lactic acidosis. Suspected urinary source although can't rule out GI yet - Lactate normalized after fluid resuscitation - UA consistent/concerning for infection; has foley in place since 4/2, placed in ER, then changed with [facility] on 07/21/23 after failing voiding trial -Evaluated by urology on 08/07/2023 and not recommended for Foley exchange at this time - follow up urine culture; currently GNR - follow up blood cultures, see bacteremia - continue abx  E coli bacteremia - Presumed translocation from urinary source; awaiting HIDA results though - 2/3 bottles with E. Coli; historically has had E.coli in urine rather pansens at least to CTX - d/c cefepime and start rocephin   Cholelithiasis - denies abd pain; however with further collateral information from his wife later in the afternoon, she states patient has had persistent vomiting with most meals but notably fatty meals; denies known history of gastroparesis; symptoms have been going on over 1 month  she states and has been becoming more of a problem recently - Cholelithiasis and gallbladder sludge with mild wall thickening noted on RUQ ultrasound; low suspicion for acute chole. Presentation maybe more consistent with chronic chole? Vs symptomatic cholelithiasis vs biliary dyskinesia - given insistence on further workup from wife, will obtain HIDA scan for further workup and then discuss with surgery depending on results  Acute urinary retention - Foley catheter placed on 07/13/2023 due to outlet obstruction from BPH; exchanged on 07/21/23 at [facility]  - Has outpatient follow-up with urology - Continue Foley; not rec'd to be changed at this time per urology; if GI workup negative, may need to re-discuss with urology   VTE (venous thromboembolism) - recent history of acute DVT in left common femoral vein and SF junction on 05/09/23 - recent history of PE in RLL on 05/09/23 - has been compliant on Eliquis  at home  Fall at home, initial encounter - From presumed bacteremia but has also had some deconditioning - patient/wife requesting CIR eval  Insulin  dependent type 2 diabetes mellitus (HCC) - A1c 7.5% on 08/06/2023 - Continue on SSI and CBG monitoring for now  Acute metabolic encephalopathy-resolved as of 08/07/2023 - In setting of severe sepsis from bacteremia - Mentation improved this morning  BPH (benign prostatic hyperplasia) - Follows with urology - Foley in place due to recent outlet obstruction  Iron deficiency anemia - Iron stores low - Start on oral iron  B12 deficiency - B12 level 266 - Continue on replacement  Sacral pressure ulcer - continue wound care as needed  Chronic diastolic CHF (congestive heart failure) (HCC) - no s/s exac -  Recent echo 05/10/2023: EF 60 to 65%  Essential hypertension - No BP meds noted on med rec and currently soft blood pressure  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; 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  Hypomagnesemia - Replete as  needed  Hyperlipidemia - On Lipitor  at home  GERD (gastroesophageal reflux disease) - continue protonix    Old records reviewed in assessment of this patient  Antimicrobials: Cefepime 08/06/2023 >> 08/07/2023 Rocephin 4/27 >> current   DVT prophylaxis:   apixaban  (ELIQUIS ) tablet 5 mg   Code Status:   Code Status: Limited: Do not attempt resuscitation (DNR) -DNR-LIMITED -Do Not Intubate/DNI   Mobility Assessment (Last 72 Hours)     Mobility Assessment     Row Name 08/08/23 0945 08/07/23 2000 08/07/23 1414 08/07/23 1200 08/06/23 2250   Does patient have an order for bedrest or is patient medically unstable No - Continue assessment No - Continue assessment -- -- No - Continue assessment   What is the highest level of mobility based on the progressive mobility assessment? Level 3 (Stands with assist) - Balance while standing  and cannot march in place Level 3 (Stands with assist) - Balance while standing  and cannot march in place Level 3 (Stands with assist) - Balance while standing  and cannot march in place Level 3 (Stands with assist) - Balance while standing  and cannot march in place Level 1 (Bedfast) - Unable to balance while sitting on edge of bed   Is the above level different from baseline mobility prior to current illness? Yes - Recommend PT order Yes - Recommend PT order -- -- Yes - Recommend PT order            Barriers to discharge: none Disposition Plan:  Home HH orders placed: n/a Status is: Inpt  Objective: Blood pressure (!) 121/59, pulse 90, temperature 98.8 F (37.1 C), temperature source Oral, resp. rate 20, height 6' (1.829 m), weight 91.7 kg, SpO2 98%.  Examination:  Physical Exam Constitutional:      General: He is not in acute distress.    Appearance: Normal appearance.  HENT:     Head: Normocephalic and atraumatic.     Mouth/Throat:     Mouth: Mucous membranes are moist.  Eyes:     Extraocular Movements: Extraocular movements intact.   Cardiovascular:     Rate and Rhythm: Normal rate and regular rhythm.  Pulmonary:     Effort: Pulmonary effort is normal. No respiratory distress.     Breath sounds: Normal breath sounds. No wheezing.  Abdominal:     General: Bowel sounds are normal. There is no distension.     Palpations: Abdomen is soft.     Tenderness: There is no abdominal tenderness.  Genitourinary:    Comments: Foley in place Musculoskeletal:        General: Normal range of motion.     Cervical back: Normal range of motion and neck supple.  Skin:    General: Skin is warm and dry.  Neurological:     General: No focal deficit present.     Mental Status: He is alert.  Psychiatric:        Mood and Affect: Mood normal.        Behavior: Behavior normal.      Consultants:  Urology  Procedures:    Data Reviewed: Results for orders placed or performed during the hospital encounter of 08/06/23 (from the past 24 hours)  Glucose, capillary     Status: Abnormal  Collection Time: 08/07/23  4:10 PM  Result Value Ref Range   Glucose-Capillary 152 (H) 70 - 99 mg/dL  Glucose, capillary     Status: Abnormal   Collection Time: 08/07/23  8:50 PM  Result Value Ref Range   Glucose-Capillary 202 (H) 70 - 99 mg/dL  Glucose, capillary     Status: Abnormal   Collection Time: 08/07/23  9:59 PM  Result Value Ref Range   Glucose-Capillary 178 (H) 70 - 99 mg/dL  Basic metabolic panel with GFR     Status: Abnormal   Collection Time: 08/08/23  5:31 AM  Result Value Ref Range   Sodium 139 135 - 145 mmol/L   Potassium 3.6 3.5 - 5.1 mmol/L   Chloride 104 98 - 111 mmol/L   CO2 20 (L) 22 - 32 mmol/L   Glucose, Bld 148 (H) 70 - 99 mg/dL   BUN 16 8 - 23 mg/dL   Creatinine, Ser 2.44 0.61 - 1.24 mg/dL   Calcium  8.5 (L) 8.9 - 10.3 mg/dL   GFR, Estimated >01 >02 mL/min   Anion gap 15 5 - 15  Magnesium      Status: None   Collection Time: 08/08/23  5:31 AM  Result Value Ref Range   Magnesium  1.7 1.7 - 2.4 mg/dL  Glucose,  capillary     Status: Abnormal   Collection Time: 08/08/23  8:07 AM  Result Value Ref Range   Glucose-Capillary 147 (H) 70 - 99 mg/dL  CBC with Differential/Platelet     Status: Abnormal   Collection Time: 08/08/23  8:22 AM  Result Value Ref Range   WBC 7.8 4.0 - 10.5 K/uL   RBC 3.32 (L) 4.22 - 5.81 MIL/uL   Hemoglobin 9.3 (L) 13.0 - 17.0 g/dL   HCT 72.5 (L) 36.6 - 44.0 %   MCV 86.7 80.0 - 100.0 fL   MCH 28.0 26.0 - 34.0 pg   MCHC 32.3 30.0 - 36.0 g/dL   RDW 34.7 (H) 42.5 - 95.6 %   Platelets 239 150 - 400 K/uL   nRBC 0.0 0.0 - 0.2 %   Neutrophils Relative % 80 %   Neutro Abs 6.2 1.7 - 7.7 K/uL   Lymphocytes Relative 9 %   Lymphs Abs 0.7 0.7 - 4.0 K/uL   Monocytes Relative 10 %   Monocytes Absolute 0.8 0.1 - 1.0 K/uL   Eosinophils Relative 0 %   Eosinophils Absolute 0.0 0.0 - 0.5 K/uL   Basophils Relative 0 %   Basophils Absolute 0.0 0.0 - 0.1 K/uL   Immature Granulocytes 1 %   Abs Immature Granulocytes 0.07 0.00 - 0.07 K/uL    I have reviewed pertinent nursing notes, vitals, labs, and images as necessary. I have ordered labwork to follow up on as indicated.  I have reviewed the last notes from staff over past 24 hours. I have discussed patient's care plan and test results with nursing staff, CM/SW, and other staff as appropriate.  Time spent: Greater than 50% of the 55 minute visit was spent in counseling/coordination of care for the patient as laid out in the A&P.   LOS: 2 days   Faith Homes, MD [facility] 08/08/2023, 12:55 PM

## 2023-08-08 NOTE — Progress Notes (Signed)
 Per MD diet changed to [facility], RN explained to patient that diet will change when the scan is resulted. Patient refused to order from [facility].

## 2023-08-08 NOTE — Progress Notes (Addendum)
 Initial Nutrition Assessment  DOCUMENTATION CODES:   Severe malnutrition in context of chronic illness  INTERVENTION:   100 mg Thiamine x 7 days  MVI with minerals daily  Monitor magnesium , potassium, and phosphorus BID for at least 3 days, MD to replete as needed, as pt is at risk for refeeding syndrome   If continued CLD, recommend: Boost Breeze po TID, each supplement provides 250 kcal and 9 grams of protein If advanced to FLD or above recommend: Glucerna Shake po TID, each supplement provides 220 kcal and 10 grams of protein Prosource Plus, each packet contains 100 kcal and 15 gm protein   NUTRITION DIAGNOSIS:   Severe Malnutrition related to chronic illness as evidenced by mild fat depletion, severe muscle depletion, percent weight loss (within the last 6 months he has lost 50 lbs, 20% weight loss.).   GOAL:   Patient will meet greater than or equal to 90% of their needs   MONITOR:   PO intake, Supplement acceptance, Diet advancement, Labs  REASON FOR ASSESSMENT:   Consult Assessment of nutrition requirement/status  ASSESSMENT:   78 y.o Male with PMH of BPH s/p foley placement, depression, HTN, T2DM, HLD, neuropathy, breast cancer, GERD. Presented with recurrent falls and persistent weakness. Concern for severe sepsis, E coli, and cholelithiasis.  4/27 - RUQ Ultrasound: Cholelithiasis and gallbladder sludge with mild wall thickening  4/28 - Awaiting HIDA scan results   He reports he had a stroke in January of this year and went to CIR and was discharged at the end of the month to home. Once he was home he passed out and he was readmitted and sent to SNF and then back home. Now presented with same event as well as persistent vomiting with most fatty meals for over 1 month that has been getting worse.   Patient underwent chemotherapy/radiation 5 years ago and since then he reports his intake has not been the same which has been exacerbated by recent events.. He used  to weigh 250-260 lbs and is now around 200 lbs. Patient reports this is due to his very limited appetite. He states he used to eat a whole sandwich for lunch, chips, and fruit but  can only handle 1/2 sandwich for lunch now.   Reviewed weight history on file and within the last 6 months he has lost 50 lbs, 20% weight loss. He also has severe muscle wasting and mild fat wasting, which qualifies him for severe malnutrition. Monitor lytes and add thiamine.  Patient was on a regular diet 4/27 and was able to consume 50% of his breakfast and 100% of his lunch during this time. Currently on CLD after HIDA scan, awaiting results. Patient agreeable to trying ONS.   Typical dietary recall:  Breakfast - 2 frozen waffles Lunch - 1/2 [employer] - bites of food Snacks - Cookies Drinks - [employer]eps[employer]    Admit weight:  90 kg  Current weight: 91.7 kg    Average Meal Intake: 4/27: 50% intake x 2 recorded meals  Nutritionally Relevant Medications: Scheduled Meds:  apixaban   5 mg Oral BID   atorvastatin   80 mg Oral QHS   Chlorhexidine  Gluconate Cloth  6 each Topical Daily   vitamin B-12  1,000 mcg Oral Daily   ferrous sulfate  325 mg Oral Q breakfast   finasteride   5 mg Oral Daily   Gerhardt's butt cream   Topical BID   insulin  aspart  0-9 Units Subcutaneous TID AC & HS   nystatin   Topical TID   pantoprazole   40 mg Oral QHS   tamsulosin   0.4 mg Oral Daily   Continuous Infusions:  sodium chloride  75 mL/hr at 08/08/23 1349   cefTRIAXone (ROCEPHIN)  IV 2 g (08/08/23 1000)    Labs Reviewed: Calcium  8.5, total protein 5.4, Total bilirubin 1.8, prealbumin 10, Albumin 2.6, Iron 12,  CBG ranges from 147-202 mg/dL over the last 24 hours HgbA1c 7.5   NUTRITION - FOCUSED PHYSICAL EXAM:  Flowsheet Row Most Recent Value  Orbital Region Mild depletion  Upper Arm Region Mild depletion  Thoracic and Lumbar Region No depletion  Buccal Region Mild depletion  Temple Region Mild depletion  Clavicle Bone  Region Moderate depletion  Clavicle and Acromion Bone Region Moderate depletion  Scapular Bone Region Moderate depletion  Dorsal Hand Moderate depletion  Patellar Region Severe depletion  Anterior Thigh Region Severe depletion  Posterior Calf Region Moderate depletion  Edema (RD Assessment) None  Hair Reviewed  Eyes Reviewed  Mouth Reviewed  Skin Reviewed  Nails Reviewed       Diet Order:   Diet Order             Diet clear liquid Room service appropriate? Yes; Fluid consistency: Thin  Diet effective now                   EDUCATION NEEDS:   Education needs have been addressed  Skin:  Skin Assessment: Skin Integrity Issues: Skin Integrity Issues:: Stage I Stage I: Sacrum  Last BM:  PTA  Height:   Ht Readings from Last 1 Encounters:  08/06/23 6' (1.829 m)    Weight:   Wt Readings from Last 1 Encounters:  08/08/23 91.7 kg    Ideal Body Weight:  80.9 kg  BMI:  Body mass index is 27.42 kg/m.  Estimated Nutritional Needs:   Kcal:  2100-2300 kcal  Protein:  110-130 gm  Fluid:  >2L/day   Frederik Jansky, RD Registered Dietitian  See Amion for more information

## 2023-08-08 NOTE — PMR Pre-admission (Signed)
 PMR Admission Coordinator Pre-Admission Assessment  Patient: John [name] is an 78 y.o., male MRN: [id number] DOB: 1945-07-11 Height: 6' (182.9 cm) Weight: 91.7 kg             Insurance Information HMO:     PPO: yes     PCP:      IPA:      80/20:      OTHER:  PRIMARY: BCBS medicare      Policy#: [id number]      Subscriber: patient CM Name: Cathleen Coach      Phone#: [phone]     Fax#: [phone] Pre-Cert#: [id number] approved for 7 days from  admit ** until **      Employer:  Benefits:  Phone #: [phone]     Name: 5/2 Eff. Date: 04/13/23     Deduct: none      Out of Pocket Max: $5900      Life Max: none  CIR: $335 co pay per day days 1 until 5      SNF: no co pay days 1 until 20; $214 co pay per day days 21 until 60, no co pay per day days 61 until 100 Outpatient: $10 per visit     Co-Pay:  Home Health: 100%      Co-Pay:  DME: 80%     Co-Pay: 20% Providers: in network  SECONDARY: none  Financial Counselor:       Phone#:   The Data processing manager" for patients in Inpatient Rehabilitation Facilities with attached "Privacy Act Statement-Health Care Records" was provided and verbally reviewed with: Patient  Emergency Contact Information Contact Information     Name Relation Home Work Mobile   [place] Spouse [phone]  [phone]   Marquece, Thoreson   [phone]      Other Contacts   None on File    Current Medical History  Patient Admitting Diagnosis: Sepsis  History of Present Illness: John [name] is a 78 y.o. male with PMH significant for T2DM, indwelling Foley catheter, GERD, BPH, HTN, HLD, s/p pacemaker, breast cancer, DVT/PE, and CVA. admitted  to [facility] 08/06/23 for weakness, lethargy and recurrent falls. Pt presented with AMS after a ground level fall at home. Recent Foley cath placed by urology on 07/13/23 in the setting of BPH and outflow obstruction.   Found to have E. Coli bacteremia, sepsis secondary to UTI and treated with  antibiotics.  Imaging concerned for cholelithiasis, HIDA scan concerning for chronic cholecystitis or biliary akinesis.   On 08/10/23 has hypotensive episode, diaphoresis. EKG showed T wave changes, elevated troponins, and cardiology consulted. Underwent cardiac cath 08/10/23 had found to have an MI, and had stent placement. Choly to be postponed as OP in 6 months. Started on Plavix  and ASA.   SSI for CBGS monitoring. Began B12 replacement. Bps are currently soft.   Patient's medical record from [facility] has been reviewed by the rehabilitation admission coordinator and physician.  Past Medical History  Past Medical History:  Diagnosis Date   BPH (benign prostatic hyperplasia)    CTS (carpal tunnel syndrome)    Depression    Diabetes mellitus    Essential hypertension    GERD (gastroesophageal reflux disease)    History of breast cancer    Hypercholesteremia    Neuropathy    Has the patient had major surgery during 100 days prior to admission? yes  Family History  family history includes Cancer in his mother, paternal grandmother, and sister; Diabetes in his father;  Lymphoma in his mother; Ovarian cancer in his sister; Stroke in his father.  Current Medications   Current Facility-Administered Medications:    acetaminophen  (TYLENOL ) tablet 650 mg, 650 mg, Oral, Q6H PRN **OR** acetaminophen  (TYLENOL ) suppository 650 mg, 650 mg, Rectal, Q6H PRN, Wenona Hamilton, MD   apixaban  (ELIQUIS ) tablet 5 mg, 5 mg, Oral, BID, Regalado, Belkys A, MD, 5 mg at 08/12/23 2206   atorvastatin  (LIPITOR ) tablet 80 mg, 80 mg, Oral, QHS, Arida, Muhammad A, MD, 80 mg at 08/12/23 2206   bacitracin  ointment, , Topical, PRN, Sattenfield, Cameron W, NP   benzonatate  (TESSALON ) capsule 100 mg, 100 mg, Oral, TID PRN, Regalado, Belkys A, MD, 100 mg at 08/12/23 1641   carvedilol  (COREG ) tablet 3.125 mg, 3.125 mg, Oral, BID WC, Crenshaw, Brian S, MD, 3.125 mg at 08/12/23 1641   ceFAZolin  (ANCEF ) IVPB 2g/100 mL  premix, 2 g, Intravenous, Q8H, Arida, Muhammad A, MD, Last Rate: 200 mL/hr at 08/12/23 2229, 2 g at 08/12/23 2229   Chlorhexidine  Gluconate Cloth 2 % PADS 6 each, 6 each, Topical, Daily, Antionette Kirks A, MD, 6 each at 08/11/23 1601   clopidogrel  (PLAVIX ) tablet 75 mg, 75 mg, Oral, Q breakfast, Alvenia Aus, Muhammad A, MD, 75 mg at 08/12/23 0454   cyanocobalamin  (VITAMIN B12) tablet 1,000 mcg, 1,000 mcg, Oral, Daily, Alvenia Aus, Muhammad A, MD, 1,000 mcg at 08/12/23 0981   feeding supplement (BOOST / RESOURCE BREEZE) liquid 1 Container, 1 Container, Oral, TID BM, Arida, Muhammad A, MD, 1 Container at 08/12/23 1554   ferrous sulfate  tablet 325 mg, 325 mg, Oral, Q breakfast, Alvenia Aus, Muhammad A, MD, 325 mg at 08/12/23 1914   finasteride  (PROSCAR ) tablet 5 mg, 5 mg, Oral, Daily, Alvenia Aus, Muhammad A, MD, 5 mg at 08/12/23 7829   Gerhardt's butt cream, , Topical, BID, Arida, Muhammad A, MD, Given at 08/11/23 2031   hyoscyamine  (ANASPAZ ) disintergrating tablet 0.125 mg, 0.125 mg, Sublingual, Q4H PRN, Arida, Muhammad A, MD, 0.125 mg at 08/11/23 2107   insulin  aspart (novoLOG ) injection 0-9 Units, 0-9 Units, Subcutaneous, TID AC & HS, Arida, Muhammad A, MD, 5 Units at 08/12/23 2206   multivitamin with minerals tablet 1 tablet, 1 tablet, Oral, Daily, Wenona Hamilton, MD, 1 tablet at 08/12/23 5621   nystatin  (MYCOSTATIN /NYSTOP ) topical powder, , Topical, TID, Arida, Muhammad A, MD, Given at 08/11/23 2035   ondansetron  (ZOFRAN ) tablet 4 mg, 4 mg, Oral, Q6H PRN **OR** ondansetron  (ZOFRAN ) injection 4 mg, 4 mg, Intravenous, Q6H PRN, Antionette Kirks A, MD, 4 mg at 08/10/23 1109   Oral care mouth rinse, 15 mL, Mouth Rinse, PRN, Alvenia Aus, Muhammad A, MD   pantoprazole  (PROTONIX ) EC tablet 40 mg, 40 mg, Oral, QHS, Arida, Muhammad A, MD, 40 mg at 08/12/23 2206   sodium chloride  flush (NS) 0.9 % injection 3 mL, 3 mL, Intravenous, Q12H, Arida, Muhammad A, MD, 3 mL at 08/12/23 2237   sodium chloride  flush (NS) 0.9 % injection 3 mL, 3  mL, Intravenous, PRN, Arida, Muhammad A, MD   spironolactone  (ALDACTONE ) tablet 12.5 mg, 12.5 mg, Oral, Daily, Audery Blazing, Deannie Fabian, MD, 12.5 mg at 08/12/23 3086   tamsulosin  (FLOMAX ) capsule 0.4 mg, 0.4 mg, Oral, Daily, Arida, Muhammad A, MD, 0.4 mg at 08/12/23 5784   thiamine  (VITAMIN B1) tablet 100 mg, 100 mg, Oral, Daily, Arida, Muhammad A, MD, 100 mg at 08/12/23 6962   traZODone  (DESYREL ) tablet 50 mg, 50 mg, Oral, QHS PRN, Girguis, David, MD, 50 mg at 08/12/23 2207  Patients Current Diet:  Diet Order             Diet Heart Room service appropriate? Yes; Fluid consistency: Thin  Diet effective now                  Precautions / Restrictions Precautions Precautions: Fall Precaution/Restrictions Comments: watch BP Restrictions Weight Bearing Restrictions Per Provider Order: No   Has the patient had 2 or more falls or a fall with injury in the past year? yes  Prior Activity Level Community (5-7x/wk): needed asisst with adls  Prior Functional Level Prior Function Prior Level of Function : Needs assist, History of Falls (last six months) Physical Assist : ADLs (physical), Mobility (physical) Mobility (physical): Gait, Stairs ADLs (physical): Bathing, Dressing, IADLs Mobility Comments: Pt reports his wife makes him use a RW and if she is not present he doesn't use an AD. Pt stated he hasn't been upstairs in 3 months as his wife won't let him go up there, but also reports he "snuck upstairs" when wife wasn't there to use upstairs shower. Pt has had at least 4 falls in the last 51mo d/t LOB and feeling dizzy. Pt reports sitting in a recliner chair often. (Per chart review pt d/c from CIR 1/26 at CGA/SBA level for transfers and ambulation with RW.) ADLs Comments: Pt Independent with self-feeding and grooming. Pt reports bathing, dressing, and toileting with Supervision from wife for safety. Pt also reports use of shower chair, which he reports he dislikes but his wife "makes me use it."  Pt's wife assists with IADLs. Pt is a retired Passenger transport manager. (Per chart review pt d/c from CIR 1/26 at CGA/SBA level for transfers and ambulation with RW.)  Self Care: Did the patient need help bathing, dressing, using the toilet or eating?  Independent  Indoor Mobility: Did the patient need assistance with walking from room to room (with or without device)? Independent  Stairs: Did the patient need assistance with internal or external stairs (with or without device)? Needed some help  Functional Cognition: Did the patient need help planning regular tasks such as shopping or remembering to take medications? Independent  Patient Information Are you of Hispanic, Latino/a,or Spanish origin?: A. No, not of Hispanic, Latino/a, or Spanish origin What is your race?: A. White Do you need or want an interpreter to communicate with a doctor or health care staff?: 0. No  Patient's Response To:  Health Literacy and Transportation Is the patient able to respond to health literacy and transportation needs?: Yes Health Literacy - How often do you need to have someone help you when you read instructions, pamphlets, or other written material from your doctor or pharmacy?: Never In the past 12 months, has lack of transportation kept you from medical appointments or from getting medications?: No In the past 12 months, has lack of transportation kept you from meetings, work, or from getting things needed for daily living?: No  Home Assistive Devices / Equipment Home Equipment: Agricultural consultant (2 wheels), BSC/3in1, Information systems manager, Lift chair  Prior Device Use: Indicate devices/aids used by the patient prior to current illness, exacerbation or injury? Walker  Current Functional Level Cognition  Orientation Level: Oriented X4    Extremity Assessment (includes Sensation/Coordination)  Upper Extremity Assessment: Right hand dominant, Overall WFL for tasks assessed, RUE deficits/detail, LUE  deficits/detail RUE Deficits / Details: UE strength grossly 4/5; mildly decreased fine motor coordinaiton; mildly edematous throughout UE RUE Coordination: decreased fine motor (mild) LUE Deficits / Details: Shoulder strength 4-/5,  otherwise UE strength grossly 4/5; mildly decreased fine motor coordinaiton; mildly edematous throughout UE LUE Coordination: decreased fine motor (mild)  Lower Extremity Assessment: Defer to PT evaluation RLE Deficits / Details: Grossly 4/5 strength RLE Sensation: history of peripheral neuropathy RLE Coordination: decreased gross motor LLE Deficits / Details: Pt has residual weakness from CVA in January 2025. He demonstrated grossly 3/5 strength. Did not resist d/t pt's c/o L hip pain with AROM. LLE: Unable to fully assess due to pain LLE Sensation: history of peripheral neuropathy LLE Coordination: decreased gross motor    ADLs  Overall ADL's : (P) Needs assistance/impaired Eating/Feeding: Set up, Sitting Grooming: Set up, Sitting Upper Body Bathing: Set up, Supervision/ safety, Sitting Lower Body Bathing: Minimal assistance, Cueing for safety, Cueing for compensatory techniques, Sit to/from stand Upper Body Dressing : Contact guard assist, Sitting (assist for line management) Lower Body Dressing: Moderate assistance, Sitting/lateral leans, Cueing for compensatory techniques Lower Body Dressing Details (indicate cue type and reason): pt. with notable post. lob.  states he usually is seated on toilet when he completes majority of LB ADLs.  laying all the way back on side of bed with pillow support on lower opposing bed rail for safety and support while he attempted to don disposable briefs.  pain in L hip limiting ROM for attempted figure four approach.  will benefit from A/E introduction next session Toilet Transfer: Ambulation, Rolling walker (2 wheels), Minimal assistance Toilet Transfer Details (indicate cue type and reason): simulated with ambulation around  the bed and then back around bed and back to eob.  initial cues for hand placement to push up from eob but good carry over for the remaining portion of the session Toileting- Clothing Manipulation and Hygiene: Minimal assistance, Sit to/from stand, Cueing for safety, Cueing for compensatory techniques Tub/ Shower Transfer: Minimal assistance Functional mobility during ADLs: Contact guard assist, Minimal assistance General ADL Comments: cues for RW management.    Mobility  Overal bed mobility: Needs Assistance Bed Mobility: Supine to Sit, Sit to Supine Supine to sit: HOB elevated, Used rails, Contact guard Sit to supine: Contact guard assist, HOB elevated, Used rails General bed mobility comments: increased time to come to EOB-pt. reports he sleeps in a recliner., cues for hand placement to assist with pushing from eob but good carryover otherwise.    Transfers  Overall transfer level: Needs assistance Equipment used: Rolling walker (2 wheels) Transfers: Sit to/from Stand, Bed to chair/wheelchair/BSC Sit to Stand: Contact guard assist, Min assist Bed to/from chair/wheelchair/BSC transfer type:: Step pivot Step pivot transfers: Contact guard assist General transfer comment: CGA for saftey    Ambulation / Gait / Stairs / Wheelchair Mobility  Ambulation/Gait Ambulation/Gait assistance: Contact guard assist, Min assist Gait Distance (Feet): 86 Feet Assistive device: Rolling walker (2 wheels) Gait Pattern/deviations: Trunk flexed, Step-through pattern, Decreased step length - right, Decreased step length - left General Gait Details: grossly CGA, up to min A to steady on turns    Posture / Balance Balance Overall balance assessment: Needs assistance Sitting-balance support: No upper extremity supported, Feet supported Sitting balance-Leahy Scale: Good Standing balance support: Bilateral upper extremity supported, During functional activity Standing balance-Leahy Scale: Fair Standing  balance comment: pt able to statically stand with no UE and no LOB or unsteadiness, RW support dynamically    Special needs/care consideration 4/26 stage 1 sacrum Recent stay at [facility] in past 2 months     Previous Home Environment  Living Arrangements: Spouse/significant other  Lives With: Spouse  Available Help at Discharge: Family, Available 24 hours/day Type of Home: House Home Layout: Two level, Able to live on main level with bedroom/bathroom Alternate Level Stairs-Rails: Right, Left, Can reach both Alternate Level Stairs-Number of Steps: 14 Home Access: Stairs to enter Entrance Stairs-Rails: Right, Left, Can reach both Entrance Stairs-Number of Steps: 3 Bathroom Shower/Tub: Psychologist, counselling, Engineer, building services: Handicapped height Bathroom Accessibility: No Home Care Services: No  Discharge Living Setting Plans for Discharge Living Setting: Patient's home Type of Home at Discharge: House Discharge Home Access: Stairs to enter Entrance Stairs-Rails: Left, Right, Can reach both Entrance Stairs-Number of Steps: 3 Discharge Bathroom Shower/Tub: Walk-in shower, Curtain Discharge Bathroom Toilet: Handicapped height Discharge Bathroom Accessibility: No Does the patient have any problems obtaining your medications?: No  Social/Family/Support Systems Anticipated Caregiver: spouse Anticipated Industrial/product designer Information: [phone] Caregiver Availability: 24/7 Discharge Plan Discussed with Primary Caregiver: Yes Is Caregiver In Agreement with Plan?: Yes Does Caregiver/Family have Issues with Lodging/Transportation while Pt is in Rehab?: No  Goals Patient/Family Goal for Rehab: Supervision, PT, OT, SLP Expected length of stay: 12-14 days Pt/Family Agrees to Admission and willing to participate: Yes Program Orientation Provided & Reviewed with Pt/Caregiver Including Roles  & Responsibilities: Yes  Barriers to Discharge: Insurance for SNF coverage  Decrease  burden of Care through IP rehab admission: n/a  Possible need for SNF placement upon discharge:not anticipated  Patient Condition: This patient's medical and functional status has changed since the consult dated 08/08/22 in which the Rehabilitation Physician determined and documented that the patient was potentially appropriate for intensive rehabilitative care in an inpatient rehabilitation facility. Issues have been addressed and update has been discussed with Dr. Rachel Budds and patient now appropriate for inpatient rehabilitation. Will admit to inpatient rehab today.   Preadmission Screen Completed By:  Tanya Fantasia, RN MSN 08/12/2023 10:47 PM ______________________________________________________________________   Discussed status with Dr. Aaron Aason***at *** and received approval for admission today.  Admission Coordinator:  Tanya Fantasia, RN MSN time***/Date***

## 2023-08-08 NOTE — Consult Note (Signed)
 WOC Nurse Consult Note: Reason for Consult: Healed full thickness pressure injury to sacrum with scarring noted.  Moisture associated skin damage to bilateral gluteal folds, intact and erythematous Wound type: pressure and moisture.   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 Patient states his wife provided care at home.   Measurement: 1 cm x 1cm intact pink scar to sacrum.   Intact moist erythema to bilateral gluteal folds 3 cm x 1 cm each side.  Patient is wet on my arrival and he states " I stay that way"  I notify him of the risk of breakdown to his healed wound with exposure to pressure and moisture and the need for prompt incontinence care and turning and offloading pressure at least every two hours.  He agrees to notify staff when care is needed.  Wound ZOX:WRUEAV, pink Drainage (amount, consistency, odor) none Periwound:frequently moist Dressing procedure/placement/frequency: Gerhardts butt paste twice daily and PRN soilage.  Turn and reposition to offload pressure.  Will not follow at this time.  Please re-consult if needed.  Branda Cain MSN, RN, FNP-BC CWON Wound, Ostomy, Continence Nurse Outpatient [facility] [phone] Pager [phone]

## 2023-08-08 NOTE — Progress Notes (Signed)
 Radiology called and informed RN to hold PO MEDS for the scan, and  transport will be coming for this patient. RN instructed that patient goes by bed, no O2, IV infusion running.

## 2023-08-09 DIAGNOSIS — K8080 Other cholelithiasis without obstruction: Secondary | ICD-10-CM | POA: Diagnosis not present

## 2023-08-09 DIAGNOSIS — R6520 Severe sepsis without septic shock: Secondary | ICD-10-CM | POA: Diagnosis not present

## 2023-08-09 DIAGNOSIS — A419 Sepsis, unspecified organism: Secondary | ICD-10-CM | POA: Diagnosis not present

## 2023-08-09 DIAGNOSIS — R338 Other retention of urine: Secondary | ICD-10-CM | POA: Diagnosis not present

## 2023-08-09 LAB — GLUCOSE, CAPILLARY
Glucose-Capillary: 137 mg/dL — ABNORMAL HIGH (ref 70–99)
Glucose-Capillary: 128 mg/dL — ABNORMAL HIGH (ref 70–99)
Glucose-Capillary: 271 mg/dL — ABNORMAL HIGH (ref 70–99)
Glucose-Capillary: 245 mg/dL — ABNORMAL HIGH (ref 70–99)

## 2023-08-09 LAB — COMPREHENSIVE METABOLIC PANEL WITH GFR
ALT: 16 U/L (ref 0–44)
AST: 31 U/L (ref 15–41)
Albumin: 2.3 g/dL — ABNORMAL LOW (ref 3.5–5.0)
Alkaline Phosphatase: 46 U/L (ref 38–126)
Anion gap: 13 (ref 5–15)
BUN: 13 mg/dL (ref 8–23)
CO2: 22 mmol/L (ref 22–32)
Calcium: 8.2 mg/dL — ABNORMAL LOW (ref 8.9–10.3)
Chloride: 103 mmol/L (ref 98–111)
Creatinine, Ser: 0.87 mg/dL (ref 0.61–1.24)
GFR, Estimated: >60 mL/min (ref >60)
Glucose, Bld: 140 mg/dL — ABNORMAL HIGH (ref 70–99)
Potassium: 3.2 mmol/L — ABNORMAL LOW (ref 3.5–5.1)
Sodium: 138 mmol/L (ref 135–145)
Total Bilirubin: 1.7 mg/dL — ABNORMAL HIGH (ref 0.0–1.2)
Total Protein: 5.1 g/dL — ABNORMAL LOW (ref 6.5–8.1)

## 2023-08-09 LAB — URINE CULTURE

## 2023-08-09 LAB — CULTURE, BLOOD (ROUTINE X 2)

## 2023-08-09 LAB — CBC WITH DIFFERENTIAL/PLATELET
Abs Immature Granulocytes: 0.07 10*3/uL (ref 0.00–0.07)
Basophils Absolute: 0 10*3/uL (ref 0.0–0.1)
Basophils Relative: 1 %
Eosinophils Absolute: 0 10*3/uL (ref 0.0–0.5)
Eosinophils Relative: 0 %
HCT: 30.1 % — ABNORMAL LOW (ref 39.0–52.0)
Hemoglobin: 10.1 g/dL — ABNORMAL LOW (ref 13.0–17.0)
Immature Granulocytes: 1 %
Lymphocytes Relative: 12 %
Lymphs Abs: 0.8 10*3/uL (ref 0.7–4.0)
MCH: 28.3 pg (ref 26.0–34.0)
MCHC: 33.6 g/dL (ref 30.0–36.0)
MCV: 84.3 fL (ref 80.0–100.0)
Monocytes Absolute: 0.8 10*3/uL (ref 0.1–1.0)
Monocytes Relative: 12 %
Neutro Abs: 5 10*3/uL (ref 1.7–7.7)
Neutrophils Relative %: 74 %
Platelets: 240 10*3/uL (ref 150–400)
RBC: 3.57 MIL/uL — ABNORMAL LOW (ref 4.22–5.81)
RDW: 18.5 % — ABNORMAL HIGH (ref 11.5–15.5)
WBC: 6.7 10*3/uL (ref 4.0–10.5)
nRBC: 0 % (ref 0.0–0.2)

## 2023-08-09 LAB — APTT
aPTT: 46 s — ABNORMAL HIGH (ref 24–36)
aPTT: 126 s — ABNORMAL HIGH (ref 24–36)

## 2023-08-09 LAB — PHOSPHORUS: Phosphorus: 2 mg/dL — ABNORMAL LOW (ref 2.5–4.6)

## 2023-08-09 LAB — MAGNESIUM: Magnesium: 1.7 mg/dL (ref 1.7–2.4)

## 2023-08-09 LAB — HEPARIN LEVEL (UNFRACTIONATED): Heparin Unfractionated: >1.1 [IU]/mL — ABNORMAL HIGH (ref 0.30–0.70)

## 2023-08-09 MED ORDER — CEFAZOLIN SODIUM-DEXTROSE 2-4 GM/100ML-% IV SOLN
2.0000 g | Freq: Three times a day (TID) | INTRAVENOUS | Status: DC
  Administered 2023-08-10 – 2023-08-13 (×10): 2 g via INTRAVENOUS
  Filled 2023-08-09 (×13): qty 100

## 2023-08-09 MED ORDER — POTASSIUM PHOSPHATES 15 MMOLE/5ML IV SOLN
30.0000 mmol | Freq: Once | INTRAVENOUS | Status: AC
  Administered 2023-08-09: 30 mmol via INTRAVENOUS
  Filled 2023-08-09: qty 10

## 2023-08-09 NOTE — Progress Notes (Signed)
  Inpatient Rehabilitation Admissions Coordinator   I met with patient at bedside. He states he is to have his gallbladder removed tomorrow. I will follow up postoperatively for his rehab venue needs.  Jeannetta Millman, RN, MSN Rehab Admissions Coordinator [phone] 08/09/2023 12:26 PM

## 2023-08-09 NOTE — Progress Notes (Signed)
 PHARMACY - ANTICOAGULATION CONSULT NOTE  Pharmacy Consult for IV heparin  Indication: hx of pulmonary embolus  Patient Measurements: Height: 6' (182.9 cm) Weight: 91.7 kg (202 lb 2.6 oz) IBW/kg (Calculated) : 77.6 HEPARIN  DW (KG): 90  Vital Signs: Temp: 98.3 F (36.8 C) (04/29 2039) Temp Source: Oral (04/29 2039) BP: 132/69 (04/29 2039) Pulse Rate: 85 (04/29 2039)  Labs: Recent Labs    08/06/23 2344 08/07/23 1610 08/07/23 9604 08/08/23 0531 08/08/23 0822 08/09/23 0503 08/09/23 0845 08/09/23 2100  HGB  --  9.7*   < >  --  9.3* 10.1*  --   --   HCT  --  29.8*  --   --  28.8* 30.1*  --   --   PLT  --  233  --   --  239 240  --   --   APTT  --   --   --   --   --   --  46* 126*  LABPROT 17.4*  --   --   --   --   --   --   --   INR 1.4*  --   --   --   --   --   --   --   HEPARINUNFRC  --   --   --   --   --   --  >1.10*  --   CREATININE  --  1.02  --  1.05  --  0.87  --   --   CKTOTAL 111  --   --   --   --   --   --   --    < > = values in this interval not displayed.    Estimated Creatinine Clearance: 78 mL/min (by C-G formula based on SCr of 0.87 mg/dL).   Medical History: Past Medical History:  Diagnosis Date   BPH (benign prostatic hyperplasia)    CTS (carpal tunnel syndrome)    Depression    Diabetes mellitus    Essential hypertension    GERD (gastroesophageal reflux disease)    History of breast cancer    Hypercholesteremia    Neuropathy    Assessment: 78 yo male on chronic Eliquis  for hx PE, pharmacy asked to start IV Heparin  while Eliquis  is on hold for possible procedures.  Last dose given 4/28 at 1338 PM.  -aPTT 126 on 1400 units/hr, drawn appropriately from opposite extremity. No other issues per RN or signs/symptoms of bleeding. Last CBC stable. Patient scheduled for laparoscopic cholecystectomy on 4/30.  Goal of Therapy:  Heparin  level 0.3-0.7 units/ml, aPTT 66-102 seconds Monitor platelets by anticoagulation protocol: Yes   Plan:   -Decrease heparin  to 1250 units/hr -aPTT with AM labs -Per surgery note, pause heparin  4h prior to procedure on 4/30   Young Hensen, PharmD. Clinical Pharmacist 08/09/2023 10:29 PM  ADDENDUM -AM aPTT elevated at 125 seconds and heparin  still falsely elevated. Per RN, no signs/symptoms of bleeding.  Goal of Therapy:  Heparin  level 0.3-0.7 units/ml, aPTT 66-102 seconds Monitor platelets by anticoagulation protocol: Yes   Plan:  -Hold heparin  and f/u [facility] plan s/p lap chole given schedule for OR @1055  (will be held for 4h)  Young Hensen, PharmD. Clinical Pharmacist 08/10/2023 6:39 AM

## 2023-08-09 NOTE — Progress Notes (Signed)
 Progress Note    John [name]   IHK:[id number]  DOB: Mar 15, 1946  DOA: 08/06/2023     3 PCP: Jobe Mulder, DO  Initial CC: weakness, falls  Hospital Course: John [name] is a 78 year old male with PMH BPH s/p Foley placement, depression, HTN, DM II, HLD, neuropathy who presented with worsening weakness, lethargy, and recurrent falls at home. He recently had Foley catheter placement by urology on 07/13/2023 in the setting of BPH and outlet obstruction.  Due to the persistent weakness, he presented for further evaluation.  Urinalysis was concerning for signs of infection.  Blood cultures were drawn and he was admitted for further workup and monitoring. Imaging also showed cholelithiasis which also underwent further workup.  Interval History:  No events overnight.  Still having no vomiting or abdominal pain.  Tentative plan for surgery tomorrow but awaiting further evaluation.  Otherwise resting comfortably in bed with no acute issues.  Assessment and Plan: * Severe sepsis (HCC) - Tachycardia, tachypnea, leukocytosis, lactic acidosis. Suspected urinary source although can't rule out GI yet - Lactate normalized after fluid resuscitation - UA consistent/concerning for infection; has foley in place since 4/2, placed in ER, then changed with [facility] on 07/21/23 after failing voiding trial -Evaluated by urology on 08/07/2023 and not recommended for Foley exchange at this time - Urine culture growing Klebsiella and E. coli - Continue Rocephin - follow up blood cultures, see bacteremia  E coli bacteremia - Presumed translocation from urinary source, although still possible a GI source? - 2/3 bottles with E. Coli; historically has had E.coli in urine rather pansens at least to CTX - Continue Rocephin  Cholelithiasis - denies abd pain; however with further collateral information from his wife later in the afternoon, she states patient has had persistent vomiting with most meals but  notably fatty meals; denies known history of gastroparesis; symptoms have been going on over 1 month she states and has been becoming more of a problem recently - Cholelithiasis and gallbladder sludge with mild wall thickening noted on RUQ ultrasound; low suspicion for acute chole. Presentation maybe more consistent with chronic chole? Vs symptomatic cholelithiasis vs biliary dyskinesia - HIDA obtained on 4/28 showing  EF 4%; discussed with surgery, tentative plan for CCY on 4/30; follow up consult   Acute urinary retention - Foley catheter placed on 07/13/2023 due to outlet obstruction from BPH; exchanged on 07/21/23 at [facility]  - Has outpatient follow-up with urology - Continue Foley; not rec'd to be changed at this time per urology  VTE (venous thromboembolism) - recent history of acute DVT in left common femoral vein and SF junction on 05/09/23 - recent history of PE in RLL on 05/09/23 - has been compliant on Eliquis  at home - Heparin  drip started and Eliquis  placed on hold in setting of potential surgery for cholecystectomy  Fall at home, initial encounter - From presumed bacteremia but has also had some deconditioning - patient/wife requesting CIR eval  Insulin  dependent type 2 diabetes mellitus (HCC) - A1c 7.5% on 08/06/2023 - Continue on SSI and CBG monitoring for now  Acute metabolic encephalopathy-resolved as of 08/07/2023 - In setting of severe sepsis from bacteremia - Mentation improved this morning  BPH (benign prostatic hyperplasia) - Follows with urology - Foley in place due to recent outlet obstruction  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; 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  Iron deficiency anemia - Iron stores low - Start on oral iron  B12 deficiency - B12 level 266 -  Continue on replacement  Sacral pressure ulcer - continue wound care as needed  Chronic diastolic CHF (congestive heart failure) (HCC) - no s/s exac - Recent echo 05/10/2023: EF 60 to  65%  Essential hypertension - No BP meds noted on med rec and currently soft blood pressure  Hypomagnesemia - Replete as needed  Hyperlipidemia - On Lipitor  at home  GERD (gastroesophageal reflux disease) - continue protonix    Old records reviewed in assessment of this patient  Antimicrobials: Cefepime 08/06/2023 >> 08/07/2023 Rocephin 4/27 >> current   DVT prophylaxis:  Heparin  drip   Code Status:   Code Status: Limited: Do not attempt resuscitation (DNR) -DNR-LIMITED -Do Not Intubate/DNI   Mobility Assessment (Last 72 Hours)     Mobility Assessment     Row Name 08/09/23 0748 08/08/23 2130 08/08/23 1339 08/08/23 0945 08/07/23 2000   Does patient have an order for bedrest or is patient medically unstable No - Continue assessment No - Continue assessment -- No - Continue assessment No - Continue assessment   What is the highest level of mobility based on the progressive mobility assessment? Level 3 (Stands with assist) - Balance while standing  and cannot march in place Level 1 (Bedfast) - Unable to balance while sitting on edge of bed Level 1 (Bedfast) - Unable to balance while sitting on edge of bed Level 3 (Stands with assist) - Balance while standing  and cannot march in place Level 3 (Stands with assist) - Balance while standing  and cannot march in place   Is the above level different from baseline mobility prior to current illness? Yes - Recommend PT order Yes - Recommend PT order -- Yes - Recommend PT order Yes - Recommend PT order    Row Name 08/07/23 1414 08/07/23 1200 08/06/23 2250       Does patient have an order for bedrest or is patient medically unstable -- -- No - Continue assessment     What is the highest level of mobility based on the progressive mobility assessment? Level 3 (Stands with assist) - Balance while standing  and cannot march in place Level 3 (Stands with assist) - Balance while standing  and cannot march in place Level 1 (Bedfast) - Unable to  balance while sitting on edge of bed     Is the above level different from baseline mobility prior to current illness? -- -- Yes - Recommend PT order              Barriers to discharge: none Disposition Plan:  Home HH orders placed: n/a Status is: Inpt  Objective: Blood pressure 119/72, pulse (!) 108, temperature 99.2 F (37.3 C), temperature source Oral, resp. rate (!) 22, height 6' (1.829 m), weight 91.7 kg, SpO2 95%.  Examination:  Physical Exam Constitutional:      General: He is not in acute distress.    Appearance: Normal appearance.  HENT:     Head: Normocephalic and atraumatic.     Mouth/Throat:     Mouth: Mucous membranes are moist.  Eyes:     Extraocular Movements: Extraocular movements intact.  Cardiovascular:     Rate and Rhythm: Normal rate and regular rhythm.  Pulmonary:     Effort: Pulmonary effort is normal. No respiratory distress.     Breath sounds: Normal breath sounds. No wheezing.  Abdominal:     General: Bowel sounds are normal. There is no distension.     Palpations: Abdomen is soft.     Tenderness: There  is no abdominal tenderness.  Genitourinary:    Comments: Foley in place Musculoskeletal:        General: Normal range of motion.     Cervical back: Normal range of motion and neck supple.  Skin:    General: Skin is warm and dry.  Neurological:     General: No focal deficit present.     Mental Status: He is alert.  Psychiatric:        Mood and Affect: Mood normal.        Behavior: Behavior normal.      Consultants:  Urology General Surgery  Procedures:    Data Reviewed: Results for orders placed or performed during the hospital encounter of 08/06/23 (from the past 24 hours)  Glucose, capillary     Status: Abnormal   Collection Time: 08/08/23  1:18 PM  Result Value Ref Range   Glucose-Capillary 151 (H) 70 - 99 mg/dL  Hepatic function panel     Status: Abnormal   Collection Time: 08/08/23  2:52 PM  Result Value Ref Range    Total Protein 5.8 (L) 6.5 - 8.1 g/dL   Albumin 2.6 (L) 3.5 - 5.0 g/dL   AST 35 15 - 41 U/L   ALT 16 0 - 44 U/L   Alkaline Phosphatase 50 38 - 126 U/L   Total Bilirubin 1.2 0.0 - 1.2 mg/dL   Bilirubin, Direct 0.2 0.0 - 0.2 mg/dL   Indirect Bilirubin 1.0 (H) 0.3 - 0.9 mg/dL  Glucose, capillary     Status: Abnormal   Collection Time: 08/08/23  4:43 PM  Result Value Ref Range   Glucose-Capillary 176 (H) 70 - 99 mg/dL  Glucose, capillary     Status: Abnormal   Collection Time: 08/08/23  9:27 PM  Result Value Ref Range   Glucose-Capillary 195 (H) 70 - 99 mg/dL  CBC with Differential/Platelet     Status: Abnormal   Collection Time: 08/09/23  5:03 AM  Result Value Ref Range   WBC 6.7 4.0 - 10.5 K/uL   RBC 3.57 (L) 4.22 - 5.81 MIL/uL   Hemoglobin 10.1 (L) 13.0 - 17.0 g/dL   HCT 40.9 (L) 81.1 - 91.4 %   MCV 84.3 80.0 - 100.0 fL   MCH 28.3 26.0 - 34.0 pg   MCHC 33.6 30.0 - 36.0 g/dL   RDW 78.2 (H) 95.6 - 21.3 %   Platelets 240 150 - 400 K/uL   nRBC 0.0 0.0 - 0.2 %   Neutrophils Relative % 74 %   Neutro Abs 5.0 1.7 - 7.7 K/uL   Lymphocytes Relative 12 %   Lymphs Abs 0.8 0.7 - 4.0 K/uL   Monocytes Relative 12 %   Monocytes Absolute 0.8 0.1 - 1.0 K/uL   Eosinophils Relative 0 %   Eosinophils Absolute 0.0 0.0 - 0.5 K/uL   Basophils Relative 1 %   Basophils Absolute 0.0 0.0 - 0.1 K/uL   Immature Granulocytes 1 %   Abs Immature Granulocytes 0.07 0.00 - 0.07 K/uL  Magnesium      Status: None   Collection Time: 08/09/23  5:03 AM  Result Value Ref Range   Magnesium  1.7 1.7 - 2.4 mg/dL  Comprehensive metabolic panel with GFR     Status: Abnormal   Collection Time: 08/09/23  5:03 AM  Result Value Ref Range   Sodium 138 135 - 145 mmol/L   Potassium 3.2 (L) 3.5 - 5.1 mmol/L   Chloride 103 98 - 111 mmol/L   CO2 22 22 -  32 mmol/L   Glucose, Bld 140 (H) 70 - 99 mg/dL   BUN 13 8 - 23 mg/dL   Creatinine, Ser 9.56 0.61 - 1.24 mg/dL   Calcium  8.2 (L) 8.9 - 10.3 mg/dL   Total Protein 5.1 (L)  6.5 - 8.1 g/dL   Albumin 2.3 (L) 3.5 - 5.0 g/dL   AST 31 15 - 41 U/L   ALT 16 0 - 44 U/L   Alkaline Phosphatase 46 38 - 126 U/L   Total Bilirubin 1.7 (H) 0.0 - 1.2 mg/dL   GFR, Estimated >21 >30 mL/min   Anion gap 13 5 - 15  Phosphorus     Status: Abnormal   Collection Time: 08/09/23  5:03 AM  Result Value Ref Range   Phosphorus 2.0 (L) 2.5 - 4.6 mg/dL  Glucose, capillary     Status: Abnormal   Collection Time: 08/09/23  7:50 AM  Result Value Ref Range   Glucose-Capillary 137 (H) 70 - 99 mg/dL  Heparin  level (unfractionated)     Status: Abnormal   Collection Time: 08/09/23  8:45 AM  Result Value Ref Range   Heparin  Unfractionated >1.10 (H) 0.30 - 0.70 IU/mL  APTT     Status: Abnormal   Collection Time: 08/09/23  8:45 AM  Result Value Ref Range   aPTT 46 (H) 24 - 36 seconds  Glucose, capillary     Status: Abnormal   Collection Time: 08/09/23 11:42 AM  Result Value Ref Range   Glucose-Capillary 128 (H) 70 - 99 mg/dL    I have reviewed pertinent nursing notes, vitals, labs, and images as necessary. I have ordered labwork to follow up on as indicated.  I have reviewed the last notes from staff over past 24 hours. I have discussed patient's care plan and test results with nursing staff, CM/SW, and other staff as appropriate.  Time spent: Greater than 50% of the 55 minute visit was spent in counseling/coordination of care for the patient as laid out in the A&P.   LOS: 3 days   Faith Homes, MD [facility] 08/09/2023, 1:05 PM

## 2023-08-09 NOTE — Plan of Care (Signed)
  Problem: Respiratory: Goal: Ability to maintain adequate ventilation will improve Outcome: Progressing   Problem: Education: Goal: Ability to describe self-care measures that may prevent or decrease complications (Diabetes Survival Skills Education) will improve Outcome: Progressing   Problem: Coping: Goal: Ability to adjust to condition or change in health will improve Outcome: Progressing   Problem: Fluid Volume: Goal: Ability to maintain a balanced intake and output will improve Outcome: Progressing   Problem: Health Behavior/Discharge Planning: Goal: Ability to identify and utilize available resources and services will improve Outcome: Progressing Goal: Ability to manage health-related needs will improve Outcome: Progressing   Problem: Metabolic: Goal: Ability to maintain appropriate glucose levels will improve Outcome: Progressing   Problem: Nutritional: Goal: Maintenance of adequate nutrition will improve Outcome: Progressing Goal: Progress toward achieving an optimal weight will improve Outcome: Progressing   Problem: Skin Integrity: Goal: Risk for impaired skin integrity will decrease Outcome: Progressing   Problem: Tissue Perfusion: Goal: Adequacy of tissue perfusion will improve Outcome: Progressing

## 2023-08-09 NOTE — TOC Initial Note (Signed)
 Transition of Care [facility]) - Initial/Assessment Note    Patient Details  Name: John [name] MRN: [id number] Date of Birth: 06/25/1945  [facility] CM/SW Contact:    Cosimo Diones, RN Phone Number: 08/09/2023, 2:55 PM  Clinical Narrative: Patient presented for fever. Surgery is following for laparoscopic cholecystectomy. PTA patient was from home with spouse. Patient states he is active with [facility] PT via [facility]. Case Manager did call the agency to see which disciplines are ordered and he only has PT at this time. Patient has DME rolling walker, cane, and wheelchair. Patient states he wants to look into CIR to see if he is a potential candidate. Case Manager will continue to follow for additional transition of care needs as the patient progresses.                  Expected Discharge Plan: Home w Home Health Services Barriers to Discharge: Continued Medical Work up   Patient Goals and CMS Choice Patient states their goals for this hospitalization and ongoing recovery are:: Patient wants to get rehab from CIR before returning home.    Expected Discharge Plan and Services In-house Referral: NA Discharge Planning Services: CM Consult Post Acute Care Choice: Home Health, Resumption of Svcs/PTA Provider Living arrangements for the past 2 months: Single Family Home   HH Arranged: PT HH Agency: [facility] Date HH Agency Contacted: 08/09/23 Time HH Agency Contacted: 1455 Representative spoke with at [facility] Agency: Renetta Carter  Prior Living Arrangements/Services Living arrangements for the past 2 months: Single Family Home Lives with:: Spouse Patient language and need for interpreter reviewed:: Yes Do you feel safe going back to the place where you live?: Yes      Need for Family Participation in Patient Care: Yes (Comment)     Criminal Activity/Legal Involvement Pertinent to Current Situation/Hospitalization: No - Comment as  needed  Activities of Daily Living   ADL Screening (condition at time of admission) Independently performs ADLs?: No Does the patient have a NEW difficulty with bathing/dressing/toileting/self-feeding that is expected to last >3 days?: No Does the patient have a NEW difficulty with getting in/out of bed, walking, or climbing stairs that is expected to last >3 days?: No Does the patient have a NEW difficulty with communication that is expected to last >3 days?: No Is the patient deaf or have difficulty hearing?: Yes Does the patient have difficulty seeing, even when wearing glasses/contacts?: No Does the patient have difficulty concentrating, remembering, or making decisions?: Yes  Permission Sought/Granted Permission sought to share information with : Case Manager       Permission granted to share info w AGENCY: [facility]   Emotional Assessment Appearance:: Appears stated age Attitude/Demeanor/Rapport: Engaged Affect (typically observed): Appropriate Orientation: : Oriented to Self, Oriented to Place Alcohol / Substance Use: Not Applicable Psych Involvement: No (comment)  Admission diagnosis:  Sepsis (HCC) [A41.9] Urinary tract infection associated with indwelling urethral catheter, initial encounter (HCC) [W41.324M, N39.0] Sepsis with encephalopathy without septic shock, due to unspecified organism (HCC) [A41.9, R65.20, G93.41] Patient Active Problem List   Diagnosis Date Noted   Cholelithiasis 08/07/2023   VTE (venous thromboembolism) 08/07/2023   Lactic acidosis 08/07/2023   Sacral pressure ulcer 08/07/2023   E coli bacteremia 08/07/2023   B12 deficiency 08/07/2023   Iron deficiency anemia 08/07/2023   Severe sepsis (HCC) 08/06/2023   Chronic diastolic CHF (congestive heart failure) (HCC) 08/06/2023   Bacteriuria 08/06/2023   Essential hypertension 06/15/2023  Situational depression 06/15/2023   Protein-calorie malnutrition, severe 05/12/2023   Heart block  AV complete (HCC) 05/12/2023   Syncope 05/10/2023   Fall at home, initial encounter 05/08/2023   Hypotension 05/08/2023   Abnormal LFTs 05/08/2023   Anemia 05/08/2023   Coping style affecting medical condition 05/02/2023   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) 04/28/2023   Brainstem infarct, acute (HCC) 04/25/2023   Pressure injury of skin 04/25/2023   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   Acute kidney injury 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) 04/17/2023   DKA (diabetic ketoacidosis) (HCC) 04/16/2023   Type 2 diabetes mellitus with hyperglycemia, with long-term current use of insulin  (HCC) 07/26/2022   Bronchitis 03/20/2021   Acute non-recurrent frontal sinusitis 03/20/2021   Viral URI with cough 01/13/2021   Hyperlipidemia 11/21/2020   Dizziness and giddiness 10/20/2020   Localized swelling of both lower extremities 10/20/2020   Tendinitis of right hip flexor 10/20/2020   Aortic atherosclerosis (HCC) 09/10/2020   Insomnia 09/10/2020   Cognitive impairment 08/18/2020   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   Acute urinary retention 06/04/2020   History of COVID-19 12/10/2019   Physical deconditioning 12/10/2019   Fatigue 12/10/2019   Unsteady gait 12/10/2019   Pneumonia due to COVID-19 virus 10/26/2019   Peripheral neuropathy 10/17/2019   Depression, major, single episode, 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/2019   History of breast cancer    GERD (gastroesophageal reflux disease)    BPH (benign prostatic hyperplasia)    CTS (carpal tunnel syndrome)    Diabetic polyneuropathy associated with type 2 diabetes mellitus (HCC) 09/29/2019   Neuropathic pain of foot 09/29/2019   Port-A-Cath in place 01/03/2019   Peripheral sensory neuropathy 05/17/2018   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   Hypomagnesemia 01/18/2018   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) 12/13/2017    Axillary adenopathy 12/09/2017   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   S/P cataract extraction and insertion of intraocular lens 01/17/2017   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 [place] [address] Phone: [phone] Fax: [phone]  [facility] [address] [place] [address] Phone: [phone] Fax: [phone]  Social Drivers of Health (SDOH) Social History: SDOH Screenings   Food Insecurity: No Food Insecurity (08/07/2023)  Housing: Low Risk  (08/07/2023)  Transportation Needs: No Transportation Needs (08/07/2023)  Utilities: Not At Risk (08/07/2023)  Alcohol Screen: Low Risk  (01/27/2023)  Depression (PHQ2-9): Low Risk  (01/28/2023)  Financial Resource Strain: Low Risk  (04/07/2023)  Physical Activity: Unknown (04/07/2023)  Social Connections: Moderately Isolated (08/07/2023)  Stress: No Stress Concern Present (04/07/2023)  Tobacco Use: Low Risk  (08/06/2023)   SDOH Interventions:     Readmission Risk Interventions    05/11/2023   12:52 PM 04/22/2023   12:12 PM  Readmission Risk Prevention Plan  Medication Screening  Complete  Transportation  Screening Complete Complete  PCP or Specialist Appt within 3-5 Days Complete   HRI or Home Care Consult Complete   Social Work Consult for Recovery Care Planning/Counseling Complete   Palliative Care Screening  Not Applicable   Medication Review Oceanographer) Complete

## 2023-08-09 NOTE — Progress Notes (Signed)
 Physical Therapy Treatment Patient Details Name: John [name] MRN: [id number] DOB: 1945-10-20 Today's Date: 08/09/2023   History of Present Illness John [name] is a 78 y.o. male admitted 08/06/23 for suspected severe sepsis. Pt presented with AMS after a ground level fall at home. UA showing white and red blood cells as well as some moderate bacteria; urine cultures pending. CT abdomen showed distended gallbladder, with multiple calcified gallstones layering dependently. US  Abdomen demonstrated cholecystitis. PMH significant for T2DM, indwelling Foley catheter, GERD, BPH, HTN, HLD, s/p pacemaker, breast cancer, DVT/PE, and CVA.    PT Comments  Pt resting in bed on arrival, agreeable to therapy session, continuing to make progress toward acute goals. Pt demonstrated sit<>stand transfer with up to minA to steady pt until upright position was fully achieved. Pt demonstrated step pivot transfer to chair with RW support and CGA for safety. Pt educated on importance of time up out of bed with pt verbalizing understanding. Pt continues to benefit from skilled PT services to progress toward functional mobility goals.     If plan is discharge home, recommend the following: A little help with walking and/or transfers;A little help with bathing/dressing/bathroom;Assistance with cooking/housework;Assist for transportation;Help with stairs or ramp for entrance   Can travel by private vehicle        Equipment Recommendations  None recommended by PT (pt already has DME)    Recommendations for Other Services       Precautions / Restrictions Precautions Precautions: Fall Recall of Precautions/Restrictions: Impaired Precaution/Restrictions Comments: watch BP Restrictions Weight Bearing Restrictions Per Provider Order: No     Mobility  Bed Mobility Overal bed mobility: Needs Assistance Bed Mobility: Supine to Sit     Supine to sit: Contact guard, HOB elevated, Used rails     General bed  mobility comments: increased time to come to EOB    Transfers Overall transfer level: Needs assistance Equipment used: Rolling walker (2 wheels) Transfers: Sit to/from Stand, Bed to chair/wheelchair/BSC Sit to Stand: Contact guard assist, Min assist   Step pivot transfers: Contact guard assist       General transfer comment: CGA for initial boost and up to minA needed to steady pt until complete upright posture was reached    Ambulation/Gait               General Gait Details: pt lunch arrived, refusing further mobility   Stairs             Wheelchair Mobility     Tilt Bed    Modified Rankin (Stroke Patients Only)       Balance Overall balance assessment: Needs assistance Sitting-balance support: No upper extremity supported, Feet supported Sitting balance-Leahy Scale: Good     Standing balance support: Bilateral upper extremity supported, During functional activity Standing balance-Leahy Scale: Fair Standing balance comment: pt able to statically stand with no UE and no LOB or unsteadiness                            Communication Communication Communication: Impaired Factors Affecting Communication: Hearing impaired  Cognition Arousal: Alert Behavior During Therapy: WFL for tasks assessed/performed   PT - Cognitive impairments: No apparent impairments                         Following commands: Impaired, Intact      Cueing Cueing Techniques: Verbal cues, Gestural cues, Visual cues  Exercises  General Comments General comments (skin integrity, edema, etc.): VSS on RA      Pertinent Vitals/Pain Pain Assessment Pain Assessment: No/denies pain Pain Intervention(s): Monitored during session    Home Living                          Prior Function            PT Goals (current goals can now be found in the care plan section) Acute Rehab PT Goals PT Goal Formulation: With patient Time For Goal  Achievement: 08/21/23 Progress towards PT goals: Progressing toward goals    Frequency    Min 2X/week      PT Plan      Co-evaluation              AM-PAC PT "6 Clicks" Mobility   Outcome Measure  Help needed turning from your back to your side while in a flat bed without using bedrails?: A Little Help needed moving from lying on your back to sitting on the side of a flat bed without using bedrails?: A Little Help needed moving to and from a bed to a chair (including a wheelchair)?: A Little Help needed standing up from a chair using your arms (e.g., wheelchair or bedside chair)?: A Little Help needed to walk in hospital room?: A Lot Help needed climbing 3-5 steps with a railing? : Total 6 Click Score: 15    End of Session   Activity Tolerance: Patient tolerated treatment well;Patient limited by fatigue Patient left: in chair;with call bell/phone within reach;with chair alarm set Nurse Communication: Mobility status PT Visit Diagnosis: Muscle weakness (generalized) (M62.81);Difficulty in walking, not elsewhere classified (R26.2);Other abnormalities of gait and mobility (R26.89);Unsteadiness on feet (R26.81)     Time: 4098-1191 PT Time Calculation (min) (ACUTE ONLY): 14 min  Charges:    $Therapeutic Activity: 8-22 mins PT General Charges $$ ACUTE PT VISIT: 1 Visit                     Forrest Iha 08/09/2023, 1:25 PM

## 2023-08-09 NOTE — Consult Note (Signed)
 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  There was initially concern for urosepsis as he had recently had a Foley placed for BPH and UA was positive with urine cultures growing Klebsiella and E. coli.  Also was noted to have E. coli bacteremia.  He was initially septic with tachycardia, tachypnea, leukocytosis and lactic acidosis which normalized after fluid resuscitation.   Upon further assessment, patient's wife note that he has postprandial emesis specifically noted after fatty meals for over a month.  The patient states that his emesis is sporadic and spontaneous, perhaps several hours after eating but is also occurred without any oral intake.  He associates it most with getting up from a resting position.  He denies abdominal pain, nausea or indigestion.  Further workup including ultrasound and HIDA have demonstrated gallstones, gallbladder sludge and mild gallbladder wall thickening and HIDA demonstrated gallbladder ejection fraction of 4%.  Allergies  Allergen Reactions   Ramipril Anaphylaxis   Other Diarrhea    Severe intolerance to Chemotherapy in the past.   Sertraline Other (See Comments)    "Extreme headaches"   Adhesive [Tape] Rash    Past Medical History:  Diagnosis Date   BPH (benign prostatic hyperplasia)    CTS (carpal tunnel syndrome)    Depression    Diabetes mellitus    Essential hypertension    GERD (gastroesophageal reflux disease)    History of breast cancer    Hypercholesteremia    Neuropathy      Past Surgical History:  Procedure Laterality Date   CATARACT EXTRACTION, BILATERAL  01/18/2017   LITHOTRIPSY     MASTECTOMY Right 05/02/2018   PACEMAKER IMPLANT N/A 05/13/2023   Procedure: PACEMAKER IMPLANT;  Surgeon: Ardeen Kohler, MD;  Location: [facility] INVASIVE CV LAB;  Service: Cardiovascular;  Laterality: N/A;   TEMPORARY PACEMAKER N/A 05/11/2023   Procedure: TEMPORARY PACEMAKER;  Surgeon: [place], Peter M, MD;  Location: [facility] INVASIVE CV LAB;  Service: Cardiovascular;  Laterality: N/A;   TONSILLECTOMY AND ADENOIDECTOMY      Family History  Problem Relation Age of Onset   Lymphoma Mother    Cancer Mother    Diabetes Father    Stroke Father    Ovarian cancer Sister    Cancer Sister    Cancer Paternal Grandmother     Social History   Socioeconomic History   Marital status: Married    Spouse name: Not on file   Number of children: Not on file   Years of education: Not on file   Highest education level: Bachelor's degree (e.g., BA, AB, BS)  Occupational History   Not on file  Tobacco Use   Smoking status: Never   Smokeless tobacco: Never  Vaping Use   Vaping status: Never Used  Substance and Sexual Activity   Alcohol use: No   Drug use: No   Sexual activity: Not Currently    Partners: Female  Other Topics Concern   Not on file  Social History Narrative   Not on file   Social Drivers of Health   Financial Resource Strain: Low Risk  (04/07/2023)   Overall Physicist, medical Strain (  CARDIA)    Difficulty of Paying Living Expenses: Not hard at all  Food Insecurity: No Food Insecurity (08/07/2023)   Hunger Vital Sign    Worried About Running Out of Food in the Last Year: Never true    Ran Out of Food in the Last Year: Never true  Transportation Needs: No Transportation Needs (08/07/2023)   PRAPARE - Administrator, Civil Service (Medical): No    Lack of Transportation (Non-Medical): No  Physical Activity: Unknown (04/07/2023)   Exercise Vital Sign     Days of Exercise per Week: 0 days    Minutes of Exercise per Session: Not on file  Stress: No Stress Concern Present (04/07/2023)   [employer] of Occupational Health - Occupational Stress Questionnaire    Feeling of Stress : Not at all  Social Connections: Moderately Isolated (08/07/2023)   Social Connection and Isolation Panel [NHANES]    Frequency of Communication with Friends and Family: More than three times a week    Frequency of Social Gatherings with Friends and Family: More than three times a week    Attends Religious Services: Never    Database administrator or Organizations: No    Attends Banker Meetings: Never    Marital Status: Married    No current facility-administered medications on file prior to encounter.   Current Outpatient Medications on File Prior to Encounter  Medication Sig Dispense Refill   acetaminophen  (TYLENOL ) 650 MG CR tablet Take 1 tablet (650 mg total) by mouth every 8 (eight) hours as needed for pain.     apixaban  (ELIQUIS ) 5 MG TABS tablet Take 1 tablet (5 mg total) by mouth 2 (two) times daily. 180 tablet 1   Ascorbic Acid  (VITAMIN C) 1000 MG tablet Take 1,000 mg by mouth 3 (three) times a week.     atorvastatin  (LIPITOR ) 80 MG tablet Take 1 tablet (80 mg total) by mouth at bedtime. 90 tablet 3   buPROPion  (WELLBUTRIN ) 75 MG tablet Take 1 tablet (75 mg total) by mouth 2 (two) times daily. 180 tablet 1   clotrimazole -betamethasone  (LOTRISONE ) cream Apply 1 Application topically daily. 30 g 0   empagliflozin  (JARDIANCE ) 25 MG TABS tablet Take 1 tablet (25 mg total) by mouth daily. 30 tablet 2   finasteride  (PROSCAR ) 5 MG tablet Take 1 tablet (5 mg total) by mouth daily. 30 tablet 0   metFORMIN  (GLUCOPHAGE -XR) 500 MG 24 hr tablet Take 2 tablets (1,000 mg total) by mouth 2 (two) times daily with a meal. 120 tablet 0   Multiple Vitamin (MULTIVITAMIN) tablet Take 1 tablet by mouth 2 (two) times a week.     pantoprazole  (PROTONIX ) 40 MG  tablet Take 1 tablet (40 mg total) by mouth daily. (Take in place of omeprazole  while taking clopidogrel .) 90 tablet 1   polyethylene glycol (MIRALAX  / GLYCOLAX ) 17 g packet Take 17 g by mouth 2 (two) times daily. (Patient taking differently: Take 17 g by mouth 2 (two) times daily as needed for moderate constipation.) 14 each 0   promethazine  (PHENERGAN ) 25 MG tablet Take 0.5-1 tablets (12.5-25 mg total) by mouth every 8 (eight) hours as needed for nausea or vomiting. 20 tablet 0   Triamcinolone  Acetonide (TRIAMCINOLONE  0.1 % CREAM : EUCERIN) CREA Apply 1 Application topically 2 (two) times daily. 1 each 0   Blood Glucose Monitoring Suppl (ACCU-CHEK GUIDE) w/Device KIT Use daily to check blood sugar.  DX E11.69 1 kit 0   Lancets Misc. (ACCU-CHEK  SOFTCLIX LANCET DEV) KIT Use daily to check blood sugar.  DX E11.69     tamsulosin  (FLOMAX ) 0.4 MG CAPS capsule Take 1 capsule (0.4 mg total) by mouth daily. (Patient not taking: Reported on 08/07/2023) 30 capsule 0   [DISCONTINUED] amLODipine  (NORVASC ) 2.5 MG tablet Take 1 tablet (2.5 mg total) by mouth daily. 30 tablet 2   [DISCONTINUED] escitalopram  (LEXAPRO ) 5 MG tablet Take 1 tablet (5 mg total) by mouth daily. 30 tablet 2    Review of Systems: a complete, 10pt review of systems was completed with pertinent positives and negatives as documented in the HPI  Physical Exam: Vitals:   08/09/23 0812 08/09/23 1011  BP: 119/72   Pulse: (!) 108   Resp: (!) 22   Temp: 99.2 F (37.3 C)   SpO2:  95%   Gen: A&Ox3, no distress  Chest: respiratory effort is normal. Cardiovascular: RRR  Gastrointestinal: soft, nondistended, nontender.  Psych: appropriate mood and affect, normal insight/judgment intact  Skin: warm and dry      Latest Ref Rng & Units 08/09/2023    5:03 AM 08/08/2023    8:22 AM 08/07/2023    2:08 AM  CBC  WBC 4.0 - 10.5 K/uL 6.7  7.8  13.3   Hemoglobin 13.0 - 17.0 g/dL 16.1  9.3  9.7   Hematocrit 39.0 - 52.0 % 30.1  28.8  29.8    Platelets 150 - 400 K/uL 240  239  233        Latest Ref Rng & Units 08/09/2023    5:03 AM 08/08/2023    2:52 PM 08/08/2023    5:31 AM  CMP  Glucose 70 - 99 mg/dL 096   045   BUN 8 - 23 mg/dL 13   16   Creatinine 4.09 - 1.24 mg/dL 8.11   9.14   Sodium 782 - 145 mmol/L 138   139   Potassium 3.5 - 5.1 mmol/L 3.2   3.6   Chloride 98 - 111 mmol/L 103   104   CO2 22 - 32 mmol/L 22   20   Calcium  8.9 - 10.3 mg/dL 8.2   8.5   Total Protein 6.5 - 8.1 g/dL 5.1  5.8    Total Bilirubin 0.0 - 1.2 mg/dL 1.7  1.2    Alkaline Phos 38 - 126 U/L 46  50    AST 15 - 41 U/L 31  35    ALT 0 - 44 U/L 16  16      Lab Results  Component Value Date   INR 1.4 (H) 08/06/2023   INR 1.0 11/08/2022    Imaging: NM Hepato W/EF Result Date: 08/08/2023 CLINICAL DATA:  Vomiting, concern for cholecystitis. EXAM: NUCLEAR MEDICINE HEPATOBILIARY IMAGING WITH GALLBLADDER EF TECHNIQUE: Sequential images of the abdomen were obtained out to 60 minutes following intravenous administration of radiopharmaceutical. After oral ingestion of Ensure, gallbladder ejection fraction was determined. At 60 min, normal ejection fraction is greater than 33%. RADIOPHARMACEUTICALS:  5.5 mCi Tc-63m  Choletec IV COMPARISON:  Ultrasound August 07, 2023 FINDINGS: Prompt uptake and biliary excretion of activity by the liver is seen. Gallbladder activity is visualized, consistent with patency of cystic duct. Biliary activity passes into small bowel, consistent with patent common bile duct. Calculated gallbladder ejection fraction is 4%. (Normal gallbladder ejection fraction with Ensure is greater than 33% and less than 80%.) IMPRESSION: Reduced gallbladder ejection fraction as can be seen with chronic cholecystitis/biliary dyskinesia. Electronically Signed   By: Susana Enter  Carmina Chris M.D.   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  Patient gives different symptom history than his wife, the latter which sounds  most consistent with biliary etiology.  Given E. coli bacteremia and workup today, I do think it is reasonable to proceed with laparoscopic cholecystectomy.  I reviewed the relevant anatomy and surgical technique with the patient. Discussed risks of surgery including bleeding, infection, pain, scarring, intraabdominal injury specifically to the common bile duct and sequelae, bile leak, conversion to open surgery, subtotal cholecystectomy, blood clot, pneumonia, heart attack, stroke, failure to resolve symptoms, etc. Questions welcomed and answered.  We will tentatively plan to proceed with surgery tomorrow pending OR availability.  Please keep n.p.o. after midnight and we will need to pause his heparin  drip 4 hours before surgery.    Patient Active Problem List   Diagnosis Date Noted   Cholelithiasis 08/07/2023   VTE (venous thromboembolism) 08/07/2023   Lactic acidosis 08/07/2023   Sacral pressure ulcer 08/07/2023   E coli bacteremia 08/07/2023   B12 deficiency 08/07/2023   Iron deficiency anemia 08/07/2023   Severe sepsis (HCC) 08/06/2023   Chronic diastolic CHF (congestive heart failure) (HCC) 08/06/2023   Bacteriuria 08/06/2023   Essential hypertension 06/15/2023   Situational depression 06/15/2023   Protein-calorie malnutrition, severe 05/12/2023   Heart block AV complete (HCC) 05/12/2023   Syncope 05/10/2023   Fall at home, initial encounter 05/08/2023   Hypotension 05/08/2023   Abnormal LFTs 05/08/2023   Anemia 05/08/2023   Coping style affecting medical condition 05/02/2023   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) 04/28/2023   Brainstem infarct, acute (HCC) 04/25/2023   Pressure injury of skin 04/25/2023   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   Acute kidney injury 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) 04/17/2023   DKA (diabetic ketoacidosis) (HCC) 04/16/2023   Type 2 diabetes mellitus with hyperglycemia, with long-term current  use of insulin  (HCC) 07/26/2022   Bronchitis 03/20/2021   Acute non-recurrent frontal sinusitis 03/20/2021   Viral URI with cough 01/13/2021   Hyperlipidemia 11/21/2020   Dizziness and giddiness 10/20/2020   Localized swelling of both lower extremities 10/20/2020   Tendinitis of right hip flexor 10/20/2020   Aortic atherosclerosis (HCC) 09/10/2020   Insomnia 09/10/2020   Cognitive impairment 08/18/2020   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   Acute urinary retention 06/04/2020   History of COVID-19 12/10/2019   Physical deconditioning 12/10/2019   Fatigue 12/10/2019   Unsteady gait 12/10/2019   Pneumonia due to COVID-19 virus 10/26/2019   Peripheral neuropathy 10/17/2019   Depression, major, single episode, 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/2019   History of breast cancer    GERD (gastroesophageal reflux disease)    BPH (benign prostatic hyperplasia)    CTS (carpal tunnel syndrome)    Diabetic polyneuropathy associated with type 2 diabetes mellitus (HCC) 09/29/2019   Neuropathic pain of foot 09/29/2019   Port-A-Cath in place 01/03/2019   Peripheral sensory neuropathy 05/17/2018   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   Hypomagnesemia 01/18/2018   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) 12/13/2017   Axillary adenopathy 12/09/2017   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   S/P cataract extraction and insertion of intraocular lens 01/17/2017   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   PVD (posterior vitreous detachment), left 01/07/2017   Hyperopia of both eyes with astigmatism 11/09/2016   Class 1 obesity due to excess calories  without serious comorbidity with body mass index (BMI) of 31.0 to 31.9 in adult 09/17/2016   Encounter for monitoring tamoxifen  therapy 03/05/2016   Insulin  dependent type 2 diabetes mellitus (HCC) 07/02/2015   Diabetes 1.5, 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   Palpitation 09/16/2014        Aldon Hung, MD [facility]  See AMION to contact appropriate on-call provider

## 2023-08-09 NOTE — Progress Notes (Signed)
 PHARMACY - ANTICOAGULATION CONSULT NOTE  Pharmacy Consult for IV heparin  Indication: hx of pulmonary embolus  Allergies  Allergen Reactions   Ramipril Anaphylaxis   Other Diarrhea    Severe intolerance to Chemotherapy in the past.   Sertraline Other (See Comments)    "Extreme headaches"   Adhesive [Tape] Rash    Patient Measurements: Height: 6' (182.9 cm) Weight: 91.7 kg (202 lb 2.6 oz) IBW/kg (Calculated) : 77.6 HEPARIN  DW (KG): 90  Vital Signs: Temp: 99.2 F (37.3 C) (04/29 0812) Temp Source: Oral (04/29 0812) BP: 119/72 (04/29 0812) Pulse Rate: 108 (04/29 0812)  Labs: Recent Labs    08/06/23 2344 08/07/23 0208 08/08/23 0531 08/08/23 0822 08/09/23 0503 08/09/23 0845  HGB  --  9.7*  --  9.3* 10.1*  --   HCT  --  29.8*  --  28.8* 30.1*  --   PLT  --  233  --  239 240  --   APTT  --   --   --   --   --  46*  LABPROT 17.4*  --   --   --   --   --   INR 1.4*  --   --   --   --   --   HEPARINUNFRC  --   --   --   --   --  >1.10*  CREATININE  --  1.02 1.05  --  0.87  --   CKTOTAL 111  --   --   --   --   --     Estimated Creatinine Clearance: 78 mL/min (by C-G formula based on SCr of 0.87 mg/dL).   Medical History: Past Medical History:  Diagnosis Date   BPH (benign prostatic hyperplasia)    CTS (carpal tunnel syndrome)    Depression    Diabetes mellitus    Essential hypertension    GERD (gastroesophageal reflux disease)    History of breast cancer    Hypercholesteremia    Neuropathy     Medications:  Infusions:   cefTRIAXone (ROCEPHIN)  IV 2 g (08/09/23 0923)   heparin  1,100 Units/hr (08/09/23 0124)   potassium PHOSPHATE IVPB (in mmol) 30 mmol (08/09/23 1131)    Assessment: 78 yo male on chronic Eliquis  for hx PE, pharmacy asked to start IV Heparin  while Eliquis  is on hold for possible procedures.  Last dose given 4/28 at 1338 PM.  -aPTT 46 on 1100 units/hr, CBC stable  Goal of Therapy:  Heparin  level 0.3-0.7 units/ml, aPTT 66-102  seconds Monitor platelets by anticoagulation protocol: Yes   Plan:  -Increase heparin  to 1400 units/hr -aPTT in 8 hrs  Baxter Limber, PharmD Clinical Pharmacist **Pharmacist phone directory can now be found on [email or web address] ([email or web address]).  Listed under [facility].

## 2023-08-09 NOTE — Care Management Important Message (Signed)
 Important Message  Patient Details  Name: John [name] MRN: [id number] Date of Birth: 13-May-1945   Important Message Given:  Yes - Medicare IM     [name] 08/09/2023, 11:39 AM

## 2023-08-10 ENCOUNTER — Encounter (HOSPITAL_COMMUNITY)
Admission: EM | Disposition: A | Discharge status: Rehabilitation Facility | Payer: Self-pay | Source: Home / Self Care | Attending: Internal Medicine

## 2023-08-10 ENCOUNTER — Inpatient Hospital Stay (HOSPITAL_COMMUNITY)

## 2023-08-10 ENCOUNTER — Ambulatory Visit (HOSPITAL_COMMUNITY): Admit: 2023-08-10 | Admitting: Cardiovascular Disease

## 2023-08-10 DIAGNOSIS — R6520 Severe sepsis without septic shock: Secondary | ICD-10-CM | POA: Diagnosis not present

## 2023-08-10 DIAGNOSIS — Z7189 Other specified counseling: Secondary | ICD-10-CM | POA: Diagnosis not present

## 2023-08-10 DIAGNOSIS — I2510 Atherosclerotic heart disease of native coronary artery without angina pectoris: Secondary | ICD-10-CM | POA: Diagnosis not present

## 2023-08-10 DIAGNOSIS — Z515 Encounter for palliative care: Secondary | ICD-10-CM | POA: Diagnosis not present

## 2023-08-10 DIAGNOSIS — A419 Sepsis, unspecified organism: Secondary | ICD-10-CM | POA: Diagnosis not present

## 2023-08-10 DIAGNOSIS — R7989 Other specified abnormal findings of blood chemistry: Secondary | ICD-10-CM | POA: Diagnosis not present

## 2023-08-10 DIAGNOSIS — I214 Non-ST elevation (NSTEMI) myocardial infarction: Secondary | ICD-10-CM

## 2023-08-10 HISTORY — PX: CORONARY STENT INTERVENTION: CATH118234

## 2023-08-10 HISTORY — PX: LEFT HEART CATH AND CORONARY ANGIOGRAPHY: CATH118249

## 2023-08-10 LAB — CBC WITH DIFFERENTIAL/PLATELET
Abs Immature Granulocytes: 0.11 10*3/uL — ABNORMAL HIGH (ref 0.00–0.07)
Basophils Absolute: 0 10*3/uL (ref 0.0–0.1)
Basophils Relative: 1 %
Eosinophils Absolute: 0.2 10*3/uL (ref 0.0–0.5)
Eosinophils Relative: 3 %
HCT: 30.9 % — ABNORMAL LOW (ref 39.0–52.0)
Hemoglobin: 10.2 g/dL — ABNORMAL LOW (ref 13.0–17.0)
Immature Granulocytes: 2 %
Lymphocytes Relative: 19 %
Lymphs Abs: 1.1 10*3/uL (ref 0.7–4.0)
MCH: 27.7 pg (ref 26.0–34.0)
MCHC: 33 g/dL (ref 30.0–36.0)
MCV: 84 fL (ref 80.0–100.0)
Monocytes Absolute: 0.8 10*3/uL (ref 0.1–1.0)
Monocytes Relative: 13 %
Neutro Abs: 3.8 10*3/uL (ref 1.7–7.7)
Neutrophils Relative %: 62 %
Platelets: 236 10*3/uL (ref 150–400)
RBC: 3.68 MIL/uL — ABNORMAL LOW (ref 4.22–5.81)
RDW: 18.2 % — ABNORMAL HIGH (ref 11.5–15.5)
WBC: 6 10*3/uL (ref 4.0–10.5)
nRBC: 0 % (ref 0.0–0.2)

## 2023-08-10 LAB — COMPREHENSIVE METABOLIC PANEL WITH GFR
ALT: 18 U/L (ref 0–44)
AST: 27 U/L (ref 15–41)
Albumin: 2.3 g/dL — ABNORMAL LOW (ref 3.5–5.0)
Alkaline Phosphatase: 52 U/L (ref 38–126)
Anion gap: 8 (ref 5–15)
BUN: 10 mg/dL (ref 8–23)
CO2: 25 mmol/L (ref 22–32)
Calcium: 8.3 mg/dL — ABNORMAL LOW (ref 8.9–10.3)
Chloride: 102 mmol/L (ref 98–111)
Creatinine, Ser: 0.82 mg/dL (ref 0.61–1.24)
GFR, Estimated: >60 mL/min (ref >60)
Glucose, Bld: 280 mg/dL — ABNORMAL HIGH (ref 70–99)
Potassium: 3 mmol/L — ABNORMAL LOW (ref 3.5–5.1)
Sodium: 135 mmol/L (ref 135–145)
Total Bilirubin: 1.2 mg/dL (ref 0.0–1.2)
Total Protein: 5 g/dL — ABNORMAL LOW (ref 6.5–8.1)

## 2023-08-10 LAB — BASIC METABOLIC PANEL WITH GFR
Anion gap: 11 (ref 5–15)
BUN: 10 mg/dL (ref 8–23)
CO2: 21 mmol/L — ABNORMAL LOW (ref 22–32)
Calcium: 8.5 mg/dL — ABNORMAL LOW (ref 8.9–10.3)
Chloride: 106 mmol/L (ref 98–111)
Creatinine, Ser: 0.82 mg/dL (ref 0.61–1.24)
GFR, Estimated: >60 mL/min (ref >60)
Glucose, Bld: 301 mg/dL — ABNORMAL HIGH (ref 70–99)
Potassium: 3.4 mmol/L — ABNORMAL LOW (ref 3.5–5.1)
Sodium: 138 mmol/L (ref 135–145)

## 2023-08-10 LAB — GLUCOSE, CAPILLARY
Glucose-Capillary: 231 mg/dL — ABNORMAL HIGH (ref 70–99)
Glucose-Capillary: 324 mg/dL — ABNORMAL HIGH (ref 70–99)
Glucose-Capillary: 241 mg/dL — ABNORMAL HIGH (ref 70–99)
Glucose-Capillary: 251 mg/dL — ABNORMAL HIGH (ref 70–99)

## 2023-08-10 LAB — HEPARIN LEVEL (UNFRACTIONATED): Heparin Unfractionated: 0.98 [IU]/mL — ABNORMAL HIGH (ref 0.30–0.70)

## 2023-08-10 LAB — APTT: aPTT: 125 s — ABNORMAL HIGH (ref 24–36)

## 2023-08-10 LAB — LACTIC ACID, PLASMA
Lactic Acid, Venous: 2.1 mmol/L (ref 0.5–1.9)
Lactic Acid, Venous: 1.8 mmol/L (ref 0.5–1.9)

## 2023-08-10 LAB — MAGNESIUM: Magnesium: 1.8 mg/dL (ref 1.7–2.4)

## 2023-08-10 LAB — TROPONIN I (HIGH SENSITIVITY)
Troponin I (High Sensitivity): 767 ng/L (ref <18)
Troponin I (High Sensitivity): 750 ng/L (ref <18)

## 2023-08-10 LAB — PHOSPHORUS: Phosphorus: 2.5 mg/dL (ref 2.5–4.6)

## 2023-08-10 LAB — POCT ACTIVATED CLOTTING TIME: Activated Clotting Time: 279 s

## 2023-08-10 SURGERY — LEFT HEART CATH AND CORONARY ANGIOGRAPHY
Anesthesia: LOCAL

## 2023-08-10 MED ORDER — CLOPIDOGREL BISULFATE 300 MG PO TABS
ORAL_TABLET | ORAL | Status: AC
  Filled 2023-08-10: qty 2

## 2023-08-10 MED ORDER — LIDOCAINE HCL (PF) 1 % IJ SOLN
INTRAMUSCULAR | Status: DC | PRN
  Administered 2023-08-10: 2 mL via INTRADERMAL

## 2023-08-10 MED ORDER — HEPARIN SODIUM (PORCINE) 1000 UNIT/ML IJ SOLN
INTRAMUSCULAR | Status: AC
  Filled 2023-08-10: qty 10

## 2023-08-10 MED ORDER — MIDAZOLAM HCL 2 MG/2ML IJ SOLN
INTRAMUSCULAR | Status: DC | PRN
  Administered 2023-08-10: 1 mg via INTRAVENOUS

## 2023-08-10 MED ORDER — FENTANYL CITRATE (PF) 100 MCG/2ML IJ SOLN
INTRAMUSCULAR | Status: DC | PRN
  Administered 2023-08-10: 25 ug via INTRAVENOUS

## 2023-08-10 MED ORDER — CLOPIDOGREL BISULFATE 75 MG PO TABS
75.0000 mg | ORAL_TABLET | Freq: Every day | ORAL | Status: DC
  Administered 2023-08-11 – 2023-08-13 (×3): 75 mg via ORAL
  Filled 2023-08-10 (×3): qty 1

## 2023-08-10 MED ORDER — HEPARIN SODIUM (PORCINE) 1000 UNIT/ML IJ SOLN
INTRAMUSCULAR | Status: DC | PRN
  Administered 2023-08-10: 4000 [IU] via INTRAVENOUS
  Administered 2023-08-10: 5000 [IU] via INTRAVENOUS

## 2023-08-10 MED ORDER — MIDAZOLAM HCL 2 MG/2ML IJ SOLN
INTRAMUSCULAR | Status: AC
  Filled 2023-08-10: qty 2

## 2023-08-10 MED ORDER — POTASSIUM CHLORIDE 10 MEQ/100ML IV SOLN
10.0000 meq | INTRAVENOUS | Status: AC
  Administered 2023-08-10 (×2): 10 meq via INTRAVENOUS
  Filled 2023-08-10: qty 100

## 2023-08-10 MED ORDER — NITROGLYCERIN 1 MG/10 ML FOR IR/CATH LAB
INTRA_ARTERIAL | Status: DC | PRN
  Administered 2023-08-10: 200 ug via INTRACORONARY

## 2023-08-10 MED ORDER — VERAPAMIL HCL 2.5 MG/ML IV SOLN
INTRAVENOUS | Status: DC | PRN
  Administered 2023-08-10: 10 mL via INTRA_ARTERIAL

## 2023-08-10 MED ORDER — LIDOCAINE HCL (PF) 1 % IJ SOLN
INTRAMUSCULAR | Status: AC
Start: 2023-08-10 — End: ?
  Filled 2023-08-10: qty 30

## 2023-08-10 MED ORDER — HEPARIN (PORCINE) 25000 UT/250ML-% IV SOLN
1100.0000 [IU]/h | INTRAVENOUS | Status: DC
  Filled 2023-08-10: qty 250

## 2023-08-10 MED ORDER — SODIUM CHLORIDE 0.9 % IV SOLN: 250.0000 mL | INTRAVENOUS | Status: AC | PRN

## 2023-08-10 MED ORDER — HEPARIN (PORCINE) IN NACL 1000-0.9 UT/500ML-% IV SOLN
INTRAVENOUS | Status: DC | PRN
  Administered 2023-08-10 (×2): 500 mL

## 2023-08-10 MED ORDER — LACTATED RINGERS IV SOLN: INTRAVENOUS | Status: DC

## 2023-08-10 MED ORDER — CLOPIDOGREL BISULFATE 300 MG PO TABS
ORAL_TABLET | ORAL | Status: DC | PRN
  Administered 2023-08-10: 600 mg via ORAL

## 2023-08-10 MED ORDER — IOHEXOL 350 MG/ML SOLN
INTRAVENOUS | Status: DC | PRN
  Administered 2023-08-10: 110 mL

## 2023-08-10 MED ORDER — SODIUM CHLORIDE 0.9 % IV SOLN: INTRAVENOUS | Status: AC

## 2023-08-10 MED ORDER — SODIUM CHLORIDE 0.9% FLUSH
3.0000 mL | Freq: Two times a day (BID) | INTRAVENOUS | Status: DC
Start: 2023-08-10 — End: 2023-08-13
  Administered 2023-08-10 – 2023-08-13 (×6): 3 mL via INTRAVENOUS

## 2023-08-10 MED ORDER — FENTANYL CITRATE (PF) 100 MCG/2ML IJ SOLN
INTRAMUSCULAR | Status: AC
  Filled 2023-08-10: qty 2

## 2023-08-10 MED ORDER — SODIUM CHLORIDE 0.9 % IV BOLUS
250.0000 mL | Freq: Once | INTRAVENOUS | Status: AC
  Administered 2023-08-10: 250 mL via INTRAVENOUS

## 2023-08-10 MED ORDER — MAGNESIUM SULFATE 2 GM/50ML IV SOLN
2.0000 g | Freq: Once | INTRAVENOUS | Status: AC
Start: 2023-08-10 — End: 2023-08-10
  Administered 2023-08-10: 2 g via INTRAVENOUS
  Filled 2023-08-10: qty 50

## 2023-08-10 MED ORDER — POTASSIUM CHLORIDE 10 MEQ/100ML IV SOLN
10.0000 meq | INTRAVENOUS | Status: AC
Start: 2023-08-10 — End: 2023-08-10
  Administered 2023-08-10 (×4): 10 meq via INTRAVENOUS
  Filled 2023-08-10 (×3): qty 100

## 2023-08-10 MED ORDER — SODIUM CHLORIDE 0.9 % IV BOLUS
1000.0000 mL | Freq: Once | INTRAVENOUS | Status: AC
Start: 2023-08-10 — End: 2023-08-10
  Administered 2023-08-10: 1000 mL via INTRAVENOUS

## 2023-08-10 MED ORDER — HEPARIN (PORCINE) 25000 UT/250ML-% IV SOLN
1100.0000 [IU]/h | INTRAVENOUS | Status: DC
  Administered 2023-08-10: 1100 [IU]/h via INTRAVENOUS

## 2023-08-10 MED ORDER — HYOSCYAMINE SULFATE 0.125 MG PO TBDP
0.1250 mg | ORAL_TABLET | ORAL | Status: DC | PRN
  Administered 2023-08-11: 0.125 mg via SUBLINGUAL
  Filled 2023-08-10 (×3): qty 1

## 2023-08-10 MED ORDER — ASPIRIN 81 MG PO TBEC
81.0000 mg | DELAYED_RELEASE_TABLET | Freq: Every day | ORAL | Status: DC
  Administered 2023-08-11: 81 mg via ORAL
  Filled 2023-08-10: qty 1

## 2023-08-10 MED ORDER — VERAPAMIL HCL 2.5 MG/ML IV SOLN
INTRAVENOUS | Status: AC
Start: 2023-08-10 — End: ?
  Filled 2023-08-10: qty 2

## 2023-08-10 MED ORDER — ASPIRIN 81 MG PO CHEW
CHEWABLE_TABLET | ORAL | Status: DC | PRN
  Administered 2023-08-10: 81 mg via ORAL

## 2023-08-10 MED ORDER — SODIUM CHLORIDE 0.9% FLUSH: 3.0000 mL | INTRAVENOUS | Status: DC | PRN

## 2023-08-10 MED ORDER — NITROGLYCERIN 1 MG/10 ML FOR IR/CATH LAB
INTRA_ARTERIAL | Status: AC
  Filled 2023-08-10: qty 10

## 2023-08-10 SURGICAL SUPPLY — 14 items
BALLOON EMERGE MR 2.5X12 (BALLOONS) IMPLANT
BALLOON SAPPHIRE NC24 2.75X18 (BALLOONS) IMPLANT
CATH INFINITI AMBI 5FR JK (CATHETERS) IMPLANT
CATH INFINITI JR4 5F (CATHETERS) IMPLANT
CATH VISTA GUIDE 6FR XBLD 3.5 (CATHETERS) IMPLANT
DEVICE RAD COMP TR BAND LRG (VASCULAR PRODUCTS) IMPLANT
GLIDESHEATH SLEND SS 6F .021 (SHEATH) IMPLANT
GUIDEWIRE INQWIRE 1.5J.035X260 (WIRE) IMPLANT
KIT ENCORE 26 ADVANTAGE (KITS) IMPLANT
PACK CARDIAC CATHETERIZATION (CUSTOM PROCEDURE TRAY) ×1 IMPLANT
SET ATX-X65L (MISCELLANEOUS) IMPLANT
STENT SYNERGY XD 2.50X28 (Permanent Stent) IMPLANT
TUBING CIL FLEX 10 FLL-RA (TUBING) IMPLANT
WIRE RUNTHROUGH .014X180CM (WIRE) IMPLANT

## 2023-08-10 NOTE — Progress Notes (Signed)
 Called to room by nurse tech. Upon arrival the patient was in the chair disoriented, hypotensive, pale and clammy. Patient was transferred back to bed. Attending and rapid response at bedside. New orders given. Urology to be called regarding leaking of chronic foley catheter. Episode lasted about 10-15 minutes. Patient is now returning to baseline with mentation and blood pressure has improved.

## 2023-08-10 NOTE — Progress Notes (Signed)
 Progress Note     Subjective: Eating breakfast and wanting bacon (on HH diet). He denies abd pain, n/v.  Objective: Vital signs in last 24 hours: Temp:  [98 F (36.7 C)-98.5 F (36.9 C)] 98.5 F (36.9 C) (04/30 0450) Pulse Rate:  [83-85] 83 (04/30 0450) Resp:  [6-32] 15 (04/30 0450) BP: (132-143)/(69-70) 143/70 (04/30 0450) SpO2:  [95 %-100 %] 95 % (04/30 0450) Last BM Date : 08/05/23  Intake/Output from previous day: 04/29 0701 - 04/30 0700 In: 435.8 [I.V.:226.1; IV Piggyback:209.8] Out: 2075 [Urine:2075] Intake/Output this shift: Total I/O In: 111.1 [I.V.:20.8; IV Piggyback:90.3] Out: 600 [Urine:600]  PE: General: pleasant, WD, male who is laying in bed in NAD Lungs: Respiratory effort nonlabored Abd: soft, NT, ND MSK: all 4 extremities are symmetrical with no cyanosis, clubbing, or edema. Skin: warm and dry Psych: A&Ox3 with an appropriate affect.    Lab Results:  Recent Labs    08/09/23 0503 08/10/23 0502  WBC 6.7 6.0  HGB 10.1* 10.2*  HCT 30.1* 30.9*  PLT 240 236   BMET Recent Labs    08/09/23 0503 08/10/23 0502  NA 138 135  K 3.2* 3.0*  CL 103 102  CO2 22 25  GLUCOSE 140* 280*  BUN 13 10  CREATININE 0.87 0.82  CALCIUM  8.2* 8.3*   PT/INR No results for input(s): "LABPROT", "INR" in the last 72 hours. CMP     Component Value Date/Time   NA 135 08/10/2023 0502   K 3.0 (L) 08/10/2023 0502   CL 102 08/10/2023 0502   CO2 25 08/10/2023 0502   GLUCOSE 280 (H) 08/10/2023 0502   BUN 10 08/10/2023 0502   CREATININE 0.82 08/10/2023 0502   CREATININE 1.02 12/12/2019 0927   CALCIUM  8.3 (L) 08/10/2023 0502   PROT 5.0 (L) 08/10/2023 0502   ALBUMIN 2.3 (L) 08/10/2023 0502   AST 27 08/10/2023 0502   ALT 18 08/10/2023 0502   ALKPHOS 52 08/10/2023 0502   BILITOT 1.2 08/10/2023 0502   GFRNONAA >60 08/10/2023 0502   GFRAA >60 10/27/2019 0445   Lipase     Component Value Date/Time   LIPASE 35 04/16/2023 1448       Studies/Results: NM  Hepato W/EF Result Date: 08/08/2023 CLINICAL DATA:  Vomiting, concern for cholecystitis. EXAM: NUCLEAR MEDICINE HEPATOBILIARY IMAGING WITH GALLBLADDER EF TECHNIQUE: Sequential images of the abdomen were obtained out to 60 minutes following intravenous administration of radiopharmaceutical. After oral ingestion of Ensure, gallbladder ejection fraction was determined. At 60 min, normal ejection fraction is greater than 33%. RADIOPHARMACEUTICALS:  5.5 mCi Tc-57m  Choletec IV COMPARISON:  Ultrasound August 07, 2023 FINDINGS: Prompt uptake and biliary excretion of activity by the liver is seen. Gallbladder activity is visualized, consistent with patency of cystic duct. Biliary activity passes into small bowel, consistent with patent common bile duct. Calculated gallbladder ejection fraction is 4%. (Normal gallbladder ejection fraction with Ensure is greater than 33% and less than 80%.) IMPRESSION: Reduced gallbladder ejection fraction as can be seen with chronic cholecystitis/biliary dyskinesia. Electronically Signed   By: Tama Fails M.D.   On: 08/08/2023 15:18    Anti-infectives: Anti-infectives (From admission, onward)    Start     Dose/Rate Route Frequency Ordered Stop   08/10/23 0600  ceFAZolin  (ANCEF ) IVPB 2g/100 mL premix        2 g 200 mL/hr over 30 Minutes Intravenous Every 8 hours 08/09/23 1440     08/07/23 1000  cefTRIAXone (ROCEPHIN) 2 g in sodium chloride  0.9 %  100 mL IVPB  Status:  Discontinued        2 g 200 mL/hr over 30 Minutes Intravenous Every 24 hours 08/07/23 0805 08/09/23 1440   08/07/23 0200  ceFEPIme (MAXIPIME) 2 g in sodium chloride  0.9 % 100 mL IVPB  Status:  Discontinued        2 g 200 mL/hr over 30 Minutes Intravenous Every 8 hours 08/06/23 2125 08/07/23 0805   08/06/23 1715  ceFEPIme (MAXIPIME) 2 g in sodium chloride  0.9 % 100 mL IVPB        2 g 200 mL/hr over 30 Minutes Intravenous  Once 08/06/23 1713 08/06/23 1846        Assessment/Plan  Severe biliary dyskinesia  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     LOS: 4 days   Elwin Hammond, [facility] Surgery 08/10/2023, 9:04 AM Please see Amion for pager number during day hours 7:00am-4:30pm

## 2023-08-10 NOTE — Progress Notes (Signed)
   Palliative Medicine Inpatient Follow Up Note HPI: John [name] is a 78 year old male with PMH BPH s/p Foley placement, depression, HTN, DM II, HLD, neuropathy who presented with worsening weakness, lethargy, and recurrent falls at home.    Palliative care has been asked to support additional goals of care conversations.   Today's Discussion 08/10/2023  *Please note that this is a verbal dictation therefore any spelling or grammatical errors are due to the "[employer] One" system interpretation.  Chart reviewed inclusive of vital signs, progress notes, laboratory results, and diagnostic images.   I met with John [name] at bedside this morning. He shares frustration that he has not had his gallbladder procedure yet. We discussed the importance of his stability prior to procedures(s). He notes understanding and shares that he has required potassium for his lower levels.   Created space and opportunity for patient to explore thoughts feelings and fears regarding current medical situation. He notes wanting to feel better.  I completed a MOST form today. The patient and family outlined their wishes for the following treatment decisions:  Cardiopulmonary Resuscitation: Do Not Attempt Resuscitation (DNR/No CPR)  Medical Interventions: Limited Additional Interventions: Use medical treatment, IV fluids and cardiac monitoring as indicated, DO NOT USE intubation or mechanical ventilation. May consider use of less invasive airway support such as BiPAP or CPAP. Also provide comfort measures. Transfer to the hospital if indicated. Avoid intensive care.   Antibiotics: Antibiotics if indicated  IV Fluids: IV fluids if indicated  Feeding Tube: No feeding tube   Questions and concerns addressed/Palliative Support Provided.   Objective Assessment: Vital Signs Vitals:   08/10/23 1358 08/10/23 1559  BP: 129/82   Pulse: 71   Resp: 14   Temp: 98 F (36.7 C)   SpO2: 97% 97%    Intake/Output Summary (Last 24  hours) at 08/10/2023 1646 Last data filed at 08/10/2023 1358 Gross per 24 hour  Intake 396.96 ml  Output 2450 ml  Net -2053.04 ml   Last Weight  Most recent update: 08/08/2023  4:55 AM    Weight  91.7 kg (202 lb 2.6 oz)            Gen:  Elderly Caucasian M chronically ill appearing HEENT: Dry mucous membranes CV: Regular rate and rhythm  PULM:  On RA, breathing is even and nonlabored ABD: soft/nontender  EXT: No edema  Neuro: Alert and oriented x3  SUMMARY OF RECOMMENDATIONS   DNAR/DNI   MOST / DNR Form Completed, paper copy placed onto the chart electric copy can be found in [employer]   Goals are for improvement and possible CIR if patient deemed a candidate   Ongoing PMT support  Billing based on MDM: High ______________________________________________________________________________________ John [name] [facility] Palliative Medicine Team Team Cell Phone: [phone] Please utilize secure chat with additional questions, if there is no response within 30 minutes please call the above phone number  Palliative Medicine Team providers are available by phone from 7am to 7pm daily and can be reached through the team cell phone.  Should this patient require assistance outside of these hours, please call the patient's attending physician.

## 2023-08-10 NOTE — Progress Notes (Signed)
 PROGRESS NOTE    John [name]  XBJ:[id number] DOB: 1946-01-17 DOA: 08/06/2023 PCP: Jobe Mulder, DO   Brief Narrative: 78 year old with past medical history significant for BPH status post Foley placement, depression, hypertension, diabetes type 2, hyperlipidemia, neuropathy who presented with worsening weakness, lethargy and recurrent falls at home.  He recently had Foley catheter placement by urology on 07/13/2023 in the setting of BPH and outflow obstruction.  Due to persistent weakness he presented for further evaluation.  Urinalysis was concerning for signs of infection.  Blood cultures were drawn and he was admitted for further workup.  Found to have E. coli bacteremia, sepsis secondary to UTI, imaging concerning for cholelithiasis, HIDA scan concerning for chronic cholecystitis, surgery following    Assessment & Plan:   Principal Problem:   Severe sepsis (HCC) Active Problems:   E coli bacteremia   Acute urinary retention   Cholelithiasis   Insulin  dependent type 2 diabetes mellitus (HCC)   Fall at home, initial encounter   VTE (venous thromboembolism)   BPH (benign prostatic hyperplasia)   Heart block AV complete (HCC)   Peripheral neuropathy   GERD (gastroesophageal reflux disease)   Hyperlipidemia   Hypomagnesemia   Essential hypertension   Chronic diastolic CHF (congestive heart failure) (HCC)   Sacral pressure ulcer   B12 deficiency   Iron deficiency anemia  1-Severe sepsis in the setting of possible UTI and E. coli bacteremia - Patient presented with tachycardia, tachypnea, leukocytosis, lactic acidosis.  Source suspected UTI and old GI in origin. - Patient received IV fluids.  IV antibiotics. - Patient had recent Foley catheter change by [facility]/10.  Evaluated by urology on 4/27 who does not recommend Foley exchange at this time. Urine culture growing Klebsiella and E. Coli -Continue with IV ceftriaxone  E. coli bacteremia: - Presumed  translocation from urinary source versus possible GI source - Continue with IV ceftriaxone  Hypotension,  Near syncope Patient became hypotensive today cold, clammy, pale, drowsy disoriented IV bolus liter fluid ordered EKG some EKG changes T wave inversion Troponin ordered and elevated cardiology has been consulted CT head ordered  Elevation troponin; cardiology consulted.   Cholelithiasis: -Patient has had persistent vomiting postprandial especially with fatty meal over the last month - Right upper quadrant ultrasound cholelithiasis with gallbladder sludge. - Discussion 1/28 show ejection fraction 4% for chronic cholecystitis - Surgery consulted and recommending laparoscopic cholecystectomy   BPH acute urinary retention Has chronic Foley catheter He is leaking around the catheter, nurse to contact urology  VTE, diagnosed DVT left femoral vein SF pain 1/25 -History of PE right lower lobe 1/25 He has been compliant with Eliquis  He has been transitioned to heparin  drip  Fall at home Needs PT>    Diabetes type 2 -SSI.   Heart block completed status post pacemaker - Pacemaker placed 05/12/2023   Iron deficiency anemia - Started on replacement  B12 deficiency - He was started on B12 replacement  Sacral pressure ulcer - See wound care documentation below  Chronic diastolic heart failure - Recent echo 04/2023 ejection fraction 65%. -.  IV fluids  Essential hypertension - Not on blood pressure medication at home and currently blood pressure soft  Hypomagnesemia Replace IV magnesium   Hyperlipidemia Continue with Lipitor   GERD PPI  Hypokalemia: Replete IV   Wound care documentation below Pressure Injury 05/11/23 Sacrum Right;Left;Mid Stage 1 -  Intact skin with non-blanchable redness of a localized area usually over a bony prominence. (Active)  05/11/23 1600  Location:  Sacrum  Location Orientation: Right;Left;Mid  Staging: Stage 1 -  Intact skin with  non-blanchable redness of a localized area usually over a bony prominence.  Wound Description (Comments):   Present on Admission:      Pressure Injury 08/06/23 Sacrum Medial Stage 1 -  Intact skin with non-blanchable redness of a localized area usually over a bony prominence. (Active)  08/06/23 2250  Location: Sacrum  Location Orientation: Medial  Staging: Stage 1 -  Intact skin with non-blanchable redness of a localized area usually over a bony prominence.  Wound Description (Comments):   Present on Admission: Yes  Dressing Type Foam - Lift dressing to assess site every shift 08/09/23 2000     Nutrition Problem: Severe Malnutrition Etiology: chronic illness    Signs/Symptoms: mild fat depletion, severe muscle depletion, percent weight loss (within the last 6 months he has lost 50 lbs, 20% weight loss.) Percent weight loss: 20 %    Interventions: Refer to RD note for recommendations  Estimated body mass index is 27.42 kg/m as calculated from the following:   Height as of this encounter: 6' (1.829 m).   Weight as of this encounter: 91.7 kg.   DVT prophylaxis: heparin  gtt Code Status: DNR Family Communication: wife over phone Disposition Plan:  Status is: Inpatient Remains inpatient appropriate because: management of sepsis.     Consultants:  General Sx Cardiology   Procedures:    Antimicrobials:    Subjective: Came to bedside. Patient became drowsy, hypotensive, clammy. SBP in the 60, came around with fluids. No arrhythmia on telemetry.    Objective: Vitals:   08/09/23 1627 08/09/23 1628 08/09/23 2039 08/10/23 0450  BP:   132/69 (!) 143/70  Pulse:   85 83  Resp: 13 20 19 15   Temp:  98 F (36.7 C) 98.3 F (36.8 C) 98.5 F (36.9 C)  TempSrc:  Oral Oral Oral  SpO2:  100% 96% 95%  Weight:      Height:        Intake/Output Summary (Last 24 hours) at 08/10/2023 0730 Last data filed at 08/10/2023 0510 Gross per 24 hour  Intake 435.83 ml  Output 2075  ml  Net -1639.17 ml   Filed Weights   08/06/23 2250 08/08/23 0400  Weight: 90 kg 91.7 kg    Examination:  General exam: Appears calm and comfortable  Respiratory system: Clear to auscultation. Respiratory effort normal. Cardiovascular system: S1 & S2 heard, RRR. No JVD, murmurs, rubs, gallops or clicks. No pedal edema. Gastrointestinal system: Abdomen is nondistended, soft and nontender. No organomegaly or masses felt. Normal bowel sounds heard. Central nervous system: Alert and oriented. No focal neurological deficits. Extremities: Symmetric 5 x 5 power. Skin: No rashes, lesions or ulcers Psychiatry: Judgement and insight appear normal. Mood & affect appropriate.     Data Reviewed: I have personally reviewed following labs and imaging studies  CBC: Recent Labs  Lab 08/06/23 1713 08/06/23 1738 08/07/23 0208 08/08/23 0822 08/09/23 0503 08/10/23 0502  WBC 9.6  --  13.3* 7.8 6.7 6.0  NEUTROABS 8.5*  --   --  6.2 5.0 3.8  HGB 11.8* 11.9*  12.6* 9.7* 9.3* 10.1* 10.2*  HCT 37.1* 35.0*  37.0* 29.8* 28.8* 30.1* 30.9*  MCV 88.3  --  85.9 86.7 84.3 84.0  PLT 252  --  233 239 240 236   Basic Metabolic Panel: Recent Labs  Lab 08/06/23 1713 08/06/23 1738 08/06/23 2344 08/07/23 0208 08/08/23 0531 08/09/23 0503 08/10/23 0502  NA 136 137  137  --  137 139 138 135  K 3.8 3.7  3.7  --  3.7 3.6 3.2* 3.0*  CL 101 102  --  101 104 103 102  CO2 16*  --   --  19* 20* 22 25  GLUCOSE 190* 185*  --  186* 148* 140* 280*  BUN 12 10  --  12 16 13 10   CREATININE 1.11 0.80  --  1.02 1.05 0.87 0.82  CALCIUM  9.0  --   --  8.2* 8.5* 8.2* 8.3*  MG  --   --  1.0* 2.2 1.7 1.7 1.8  PHOS  --   --  4.2 4.4  --  2.0* 2.5   GFR: Estimated Creatinine Clearance: 82.8 mL/min (by C-G formula based on SCr of 0.82 mg/dL). Liver Function Tests: Recent Labs  Lab 08/06/23 1713 08/07/23 0208 08/08/23 1452 08/09/23 0503 08/10/23 0502  AST 29 24 35 31 27  ALT 17 16 16 16 18   ALKPHOS 65 49 50 46  52  BILITOT 1.9* 1.8* 1.2 1.7* 1.2  PROT 6.5 5.4* 5.8* 5.1* 5.0*  ALBUMIN 3.4* 2.6* 2.6* 2.3* 2.3*   No results for input(s): "LIPASE", "AMYLASE" in the last 168 hours. Recent Labs  Lab 08/06/23 2344  AMMONIA 20   Coagulation Profile: Recent Labs  Lab 08/06/23 2344  INR 1.4*   Cardiac Enzymes: Recent Labs  Lab 08/06/23 2344  CKTOTAL 111   BNP (last 3 results) No results for input(s): "PROBNP" in the last 8760 hours. HbA1C: No results for input(s): "HGBA1C" in the last 72 hours. CBG: Recent Labs  Lab 08/08/23 2127 08/09/23 0750 08/09/23 1142 08/09/23 1546 08/09/23 2046  GLUCAP 195* 137* 128* 271* 245*   Lipid Profile: No results for input(s): "CHOL", "HDL", "LDLCALC", "TRIG", "CHOLHDL", "LDLDIRECT" in the last 72 hours. Thyroid  Function Tests: No results for input(s): "TSH", "T4TOTAL", "FREET4", "T3FREE", "THYROIDAB" in the last 72 hours. Anemia Panel: No results for input(s): "VITAMINB12", "FOLATE", "FERRITIN", "TIBC", "IRON", "RETICCTPCT" in the last 72 hours. Sepsis Labs: Recent Labs  Lab 08/06/23 1739 08/06/23 2103 08/06/23 2344 08/07/23 0208  PROCALCITON  --   --  9.53  --   LATICACIDVEN 7.1* 2.7* 2.1* 1.3    Recent Results (from the past 240 hours)  Blood Culture (routine x 2)     Status: Abnormal (Preliminary result)   Collection Time: 08/06/23  5:13 PM   Specimen: BLOOD  Result Value Ref Range Status   Specimen Description BLOOD RIGHT ANTECUBITAL  Final   Special Requests   Final    BOTTLES DRAWN AEROBIC ONLY Blood Culture results may not be optimal due to an inadequate volume of blood received in culture bottles   Culture  Setup Time   Final    GRAM NEGATIVE RODS AEROBIC BOTTLE ONLY CRITICAL VALUE NOTED.  VALUE IS CONSISTENT WITH PREVIOUSLY REPORTED AND CALLED VALUE. Performed at [facility], [address] [place], [place] [address]    Culture ESCHERICHIA COLI (A)  Final   Report Status PENDING  Incomplete  Urine Culture      Status: Abnormal   Collection Time: 08/06/23  5:13 PM   Specimen: Urine, Random  Result Value Ref Range Status   Specimen Description URINE, RANDOM  Final   Special Requests   Final    URINE, CATHETERIZED Performed at [facility], [address] [place], [place] [address]    Culture (A)  Final    >=100,000 COLONIES/mL KLEBSIELLA PNEUMONIAE >=100,000 COLONIES/mL ESCHERICHIA  COLI    Report Status 08/09/2023 FINAL  Final   Organism ID, Bacteria KLEBSIELLA PNEUMONIAE (A)  Final   Organism ID, Bacteria ESCHERICHIA COLI (A)  Final      Susceptibility   Escherichia coli - MIC*    AMPICILLIN 4 SENSITIVE Sensitive     CEFAZOLIN  <=4 SENSITIVE Sensitive     CEFEPIME <=0.12 SENSITIVE Sensitive     CEFTRIAXONE <=0.25 SENSITIVE Sensitive     CIPROFLOXACIN <=0.25 SENSITIVE Sensitive     GENTAMICIN  <=1 SENSITIVE Sensitive     IMIPENEM <=0.25 SENSITIVE Sensitive     NITROFURANTOIN <=16 SENSITIVE Sensitive     TRIMETH/SULFA <=20 SENSITIVE Sensitive     AMPICILLIN/SULBACTAM <=2 SENSITIVE Sensitive     PIP/TAZO <=4 SENSITIVE Sensitive ug/mL    * >=100,000 COLONIES/mL ESCHERICHIA COLI   Klebsiella pneumoniae - MIC*    AMPICILLIN >=32 RESISTANT Resistant     CEFAZOLIN  <=4 SENSITIVE Sensitive     CEFEPIME <=0.12 SENSITIVE Sensitive     CEFTRIAXONE <=0.25 SENSITIVE Sensitive     CIPROFLOXACIN <=0.25 SENSITIVE Sensitive     GENTAMICIN  <=1 SENSITIVE Sensitive     IMIPENEM <=0.25 SENSITIVE Sensitive     NITROFURANTOIN 64 INTERMEDIATE Intermediate     TRIMETH/SULFA <=20 SENSITIVE Sensitive     AMPICILLIN/SULBACTAM 4 SENSITIVE Sensitive     PIP/TAZO <=4 SENSITIVE Sensitive ug/mL    * >=100,000 COLONIES/mL KLEBSIELLA PNEUMONIAE  Blood Culture (routine x 2)     Status: Abnormal   Collection Time: 08/06/23  5:26 PM   Specimen: BLOOD RIGHT HAND  Result Value Ref Range Status   Specimen Description BLOOD RIGHT HAND  Final   Special Requests   Final    BOTTLES DRAWN AEROBIC AND ANAEROBIC Blood  Culture results may not be optimal due to an inadequate volume of blood received in culture bottles   Culture  Setup Time   Final    GRAM NEGATIVE RODS ANAEROBIC BOTTLE ONLY CRITICAL RESULT CALLED TO, READ BACK BY AND VERIFIED WITH: JWYLAND,PHARMD@[email or web address]  08/07/23 MK Performed at [facility], [address], [place], [place] [address]    Culture ESCHERICHIA COLI (A)  Final   Report Status 08/09/2023 FINAL  Final   Organism ID, Bacteria ESCHERICHIA COLI  Final   Organism ID, Bacteria ESCHERICHIA COLI  Final      Susceptibility   Escherichia coli - KIRBY BAUER*    CEFAZOLIN  SENSITIVE Sensitive    Escherichia coli - MIC*    AMPICILLIN 4 SENSITIVE Sensitive     CEFEPIME <=0.12 SENSITIVE Sensitive     CEFTAZIDIME <=1 SENSITIVE Sensitive     CEFTRIAXONE <=0.25 SENSITIVE Sensitive     CIPROFLOXACIN <=0.25 SENSITIVE Sensitive     GENTAMICIN  <=1 SENSITIVE Sensitive     IMIPENEM <=0.25 SENSITIVE Sensitive     TRIMETH/SULFA <=20 SENSITIVE Sensitive     AMPICILLIN/SULBACTAM <=2 SENSITIVE Sensitive     PIP/TAZO <=4 SENSITIVE Sensitive ug/mL    * ESCHERICHIA COLI    ESCHERICHIA COLI  Blood Culture ID Panel (Reflexed)     Status: Abnormal   Collection Time: 08/06/23  5:26 PM  Result Value Ref Range Status   Enterococcus faecalis NOT DETECTED NOT DETECTED Final   Enterococcus Faecium NOT DETECTED NOT DETECTED Final   Listeria monocytogenes NOT DETECTED NOT DETECTED Final   Staphylococcus species NOT DETECTED NOT DETECTED Final   Staphylococcus aureus (BCID) NOT DETECTED NOT DETECTED Final   Staphylococcus epidermidis NOT DETECTED NOT DETECTED Final   Staphylococcus lugdunensis NOT DETECTED NOT DETECTED  Final   Streptococcus species NOT DETECTED NOT DETECTED Final   Streptococcus agalactiae NOT DETECTED NOT DETECTED Final   Streptococcus pneumoniae NOT DETECTED NOT DETECTED Final   Streptococcus pyogenes NOT DETECTED NOT DETECTED Final   A.calcoaceticus-baumannii NOT DETECTED NOT DETECTED  Final   Bacteroides fragilis NOT DETECTED NOT DETECTED Final   Enterobacterales DETECTED (A) NOT DETECTED Final    Comment: Enterobacterales represent a large order of gram negative bacteria, not a single organism. CRITICAL RESULT CALLED TO, READ BACK BY AND VERIFIED WITH: J WYLAND,PHARMD@[email or web address]  08/07/23 MK    Enterobacter cloacae complex NOT DETECTED NOT DETECTED Final   Escherichia coli DETECTED (A) NOT DETECTED Final    Comment: CRITICAL RESULT CALLED TO, READ BACK BY AND VERIFIED WITH: J WYLAND,PHARMD@[email or web address]  08/07/23 MK    Klebsiella aerogenes NOT DETECTED NOT DETECTED Final   Klebsiella oxytoca NOT DETECTED NOT DETECTED Final   Klebsiella pneumoniae NOT DETECTED NOT DETECTED Final   Proteus species NOT DETECTED NOT DETECTED Final   Salmonella species NOT DETECTED NOT DETECTED Final   Serratia marcescens NOT DETECTED NOT DETECTED Final   Haemophilus influenzae NOT DETECTED NOT DETECTED Final   Neisseria meningitidis NOT DETECTED NOT DETECTED Final   Pseudomonas aeruginosa NOT DETECTED NOT DETECTED Final   Stenotrophomonas maltophilia NOT DETECTED NOT DETECTED Final   Candida albicans NOT DETECTED NOT DETECTED Final   Candida auris NOT DETECTED NOT DETECTED Final   Candida glabrata NOT DETECTED NOT DETECTED Final   Candida krusei NOT DETECTED NOT DETECTED Final   Candida parapsilosis NOT DETECTED NOT DETECTED Final   Candida tropicalis NOT DETECTED NOT DETECTED Final   Cryptococcus neoformans/gattii NOT DETECTED NOT DETECTED Final   CTX-M ESBL NOT DETECTED NOT DETECTED Final   Carbapenem resistance IMP NOT DETECTED NOT DETECTED Final   Carbapenem resistance KPC NOT DETECTED NOT DETECTED Final   Carbapenem resistance NDM NOT DETECTED NOT DETECTED Final   Carbapenem resist OXA 48 LIKE NOT DETECTED NOT DETECTED Final   Carbapenem resistance VIM NOT DETECTED NOT DETECTED Final    Comment: Performed at [facility], [address] [place], [place] [address]  Resp panel by RT-PCR  (RSV, Flu A&B, Covid) Anterior Nasal Swab     Status: None   Collection Time: 08/06/23  5:53 PM   Specimen: Anterior Nasal Swab  Result Value Ref Range Status   SARS Coronavirus 2 by RT PCR NEGATIVE NEGATIVE Final   Influenza A by PCR NEGATIVE NEGATIVE Final   Influenza B by PCR NEGATIVE NEGATIVE Final    Comment: (NOTE) The Xpert Xpress SARS-CoV-2/FLU/RSV plus assay is intended as an aid in the diagnosis of influenza from Nasopharyngeal swab specimens and should not be used as a sole basis for treatment. Nasal washings and aspirates are unacceptable for Xpert Xpress SARS-CoV-2/FLU/RSV testing.  Fact Sheet for Patients: BloggerCourse.com  Fact Sheet for Healthcare Providers: SeriousBroker.it  This test is not yet approved or cleared by the [place]  FDA and has been authorized for detection and/or diagnosis of SARS-CoV-2 by FDA under an Emergency Use Authorization (EUA). This EUA will remain in effect (meaning this test can be used) for the duration of the COVID-19 declaration under Section 564(b)(1) of the Act, 21 U.S.C. section 360bbb-3(b)(1), unless the authorization is terminated or revoked.     Resp Syncytial Virus by PCR NEGATIVE NEGATIVE Final    Comment: (NOTE) Fact Sheet for Patients: BloggerCourse.com  Fact Sheet for Healthcare Providers: SeriousBroker.it  This test is not yet approved or cleared by the  [place]  FDA and has been authorized for detection and/or diagnosis of SARS-CoV-2 by FDA under an Emergency Use Authorization (EUA). This EUA will remain in effect (meaning this test can be used) for the duration of the COVID-19 declaration under Section 564(b)(1) of the Act, 21 U.S.C. section 360bbb-3(b)(1), unless the authorization is terminated or revoked.  Performed at [facility], [address], [place], [place] [address]           Radiology Studies: NM Hepato W/EF Result Date: 08/08/2023 CLINICAL DATA:  Vomiting, concern for cholecystitis. EXAM: NUCLEAR MEDICINE HEPATOBILIARY IMAGING WITH GALLBLADDER EF TECHNIQUE: Sequential images of the abdomen were obtained out to 60 minutes following intravenous administration of radiopharmaceutical. After oral ingestion of Ensure, gallbladder ejection fraction was determined. At 60 min, normal ejection fraction is greater than 33%. RADIOPHARMACEUTICALS:  5.5 mCi Tc-31m  Choletec IV COMPARISON:  Ultrasound August 07, 2023 FINDINGS: Prompt uptake and biliary excretion of activity by the liver is seen. Gallbladder activity is visualized, consistent with patency of cystic duct. Biliary activity passes into small bowel, consistent with patent common bile duct. Calculated gallbladder ejection fraction is 4%. (Normal gallbladder ejection fraction with Ensure is greater than 33% and less than 80%.) IMPRESSION: Reduced gallbladder ejection fraction as can be seen with chronic cholecystitis/biliary dyskinesia. Electronically Signed   By: Tama Fails M.D.   On: 08/08/2023 15:18        Scheduled Meds:  atorvastatin   80 mg Oral QHS   Chlorhexidine  Gluconate Cloth  6 each Topical Daily   vitamin B-12  1,000 mcg Oral Daily   feeding supplement  1 Container Oral TID BM   ferrous sulfate  325 mg Oral Q breakfast   finasteride   5 mg Oral Daily   Gerhardt's butt cream   Topical BID   insulin  aspart  0-9 Units Subcutaneous TID AC & HS   multivitamin with minerals  1 tablet Oral Daily   nystatin    Topical TID   pantoprazole   40 mg Oral QHS   tamsulosin   0.4 mg Oral Daily   thiamine  100 mg Oral Daily   Continuous Infusions:   ceFAZolin  (ANCEF ) IV 200 mL/hr at 08/10/23 0510   heparin  1,250 Units/hr (08/10/23 0510)     LOS: 4 days    Time spent: 35 Minutes    Margee Trentham A Safir Michalec, MD [facility]   If 7PM-7AM, please contact night-coverage [email or web address]  08/10/2023,  7:30 AM

## 2023-08-10 NOTE — H&P (View-Only) (Signed)
 As above, patient seen and examined.  Briefly he is a 78 year old male with past medical history of hypertension, diabetes mellitus, history of breast cancer, recent CVA, status post recent pacemaker for complete heart block admitted with confusion, sepsis and cholecystitis for evaluation prior to surgery.  Patient was in the chair earlier today and developed recurrent altered mental status for approximately 15 minutes.  He was noted to be hypotensive with systolic blood pressure into the 60s.  He was placed in bed and given IV fluids with improvement.  There was no preceding chest pain, dyspnea or nausea though he did vomit after the episode.  His troponin was checked which was 767 and electrocardiogram revealed new anterior T wave inversion.  Cardiology now asked to evaluate.  Head CT shows small vessel disease but no acute changes.  Creatinine 0.82, potassium 3.4, troponin 767, hemoglobin 10.2.  1 preoperative evaluation prior to cholecystectomy-patient had an episode of hypotension today associated with altered mental status.  His troponin is minimally elevated but more concerning is his electrocardiogram which shows new anterior T wave inversion (though he certainly could have underlying coronary disease with superimposed hypotension causing demand ischemia).  He also has 25 years of diabetes mellitus.  I think definitive evaluation is warranted.  Plan cardiac catheterization.  The risk and benefits including myocardial infarction, CVA and death discussed and he agrees to proceed.  Add aspirin  and continue heparin .  Adjust medications based on results.  2 elevated troponin-plan as outlined under #1.  3 episode of hypotension-etiology is unclear.  Episode was transient.  No arrhythmias on telemetry.  Will have device interrogated as well.  4 status post pacemaker-follow-up electrophysiology.  5 history of pulmonary embolus-would resume apixaban  once all procedures complete.  6 sepsis at time of  admission/cholecystitis-patient on antibiotics; per general surgery.  John Angel, MD      Cardiology Consultation   Patient ID: John [name] MRN: [id number]; DOB: 1945-07-08  Admit date: 08/06/2023 Date of Consult: 08/10/2023  PCP:  Jobe Mulder, DO   [facility] Providers Cardiologist:  None     Patient Profile:   John [name] is a 78 y.o. 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 Carie Charity.  History of Present Illness:   [name] is a 78 year old male with past medical history noted above.  Seen remotely in 2016 by [facility] cardiology and underwent echocardiogram which showed normal LV function and Holter monitor with isolated PVCs.  Also reports having a stress test around that time which was normal.   He was released by inpatient rehab 05/07/2023 after hospitalization for a right cerebellar stroke.  He was to [facility] and underwent CTA of chest which showed small volume of occlusive and nonocclusive PE in the right lung and pulmonary artery branches with no right heart strain.  Lower extremity Doppler showed acute DVT involving the left common femoral vein.  He was initiated on Eliquis .  MRI was done showing punctate acute infarct in the right middle cerebellar pedicle and remote lunar infarcts.  This was evaluated by the neurology team and felt to be nonacute.  He was found to be orthostatic hypotensive which was felt to be the etiology of a recent fall and syncope and his antihypertensives were held.  Cardiology was  requested on 1/29 due to abrupt onset of severe bradycardia with associated hypotension.  Telemetry was reviewed and showed wide-complex rhythm at 29 bpm consistent with idioventricular rhythm.  Echocardiogram  1/28 with LVEF of 60 to 65%, no regional wall motion abnormalities, normal RV, mildly elevated PASP 38 mmHg, no significant valvular disease.  He was transferred to [facility] on 1/29 with successful temporary pacemaker placement.  Seen the following day by EP and underwent permanent pacemaker secondary to intermittent complete heart block.  He was ultimately discharged on 2/1 and resumed on his Eliquis  that evening.   Seen back in the ED 4/2 with complaints of urinary retention.  He was found to have UTI and treated with Keflex  with Foley catheter placed.  Presented back to the ED on 4/26 with complaints of fever.  Found to have altered mental status also had had mechanical fall the night prior but refused to go to the hospital.  He was admitted for sepsis with initial lactic acid being 7.4.  Blood cultures with E. coli. Workup revealed cholelithiasis and gallbladder sludge with mild wall thickening on right upper quadrant ultrasound.  He was evaluated by urology indwelling Foley and complicated UTI.  Of note he did complained of a several week history of postprandial nausea and vomiting.  Underwent US /HIDA scan with gallstones, gallbladder sludge, mild gallbladder wall thickening EF of 40%.  General surgery consulted with recommendations to proceed with laparoscopic cholecystectomy.  This was initially planned for today, but delayed secondary to other emergencies.  He had an episode this afternoon while sitting in the chair when he became weak, cool clammy and hypotensive.  Systolic blood pressures dropped into the 60s.he was placed back in the bed, given IV fluids and vomited shortly thereafterwards.  High-sensitivity troponin obtained and noted at 767. Cardiology asked to evaluate in regards to preoperative evaluation.  In talking with the patient and his wife, he reports that this is similar to the episode when he was admitted to [facility] recently.  Similar to episode that brought him in for initial  admission.  He denies any chest pain prior to these events.  He becomes disoriented during these episodes but later feels fine once BP improves.  He has been sick over the last several months with recent  hospital admission and his mobility has been quite limited.  Though denies any chest discomfort or anginal symptoms with activities.   Past Medical History:  Diagnosis Date   BPH (benign prostatic hyperplasia)    CTS (carpal tunnel syndrome)    Depression    Diabetes mellitus    Essential hypertension    GERD (gastroesophageal reflux disease)    History of breast cancer    Hypercholesteremia    Neuropathy     Past Surgical History:  Procedure Laterality Date   CATARACT EXTRACTION, BILATERAL  01/18/2017   LITHOTRIPSY     MASTECTOMY Right 05/02/2018   PACEMAKER IMPLANT N/A 05/13/2023   Procedure: PACEMAKER IMPLANT;  Surgeon: Ardeen Kohler, MD;  Location: [facility] INVASIVE CV LAB;  Service: Cardiovascular;  Laterality: N/A;   TEMPORARY PACEMAKER N/A 05/11/2023   Procedure: TEMPORARY PACEMAKER;  Surgeon: [place], Peter M, MD;  Location: [facility] INVASIVE CV LAB;  Service: Cardiovascular;  Laterality: N/A;   TONSILLECTOMY AND ADENOIDECTOMY       Inpatient Medications: Scheduled Meds:  atorvastatin   80 mg Oral QHS   Chlorhexidine  Gluconate Cloth  6 each Topical Daily   vitamin B-12  1,000 mcg Oral Daily   feeding supplement  1 Container Oral TID BM   ferrous sulfate  325 mg Oral Q breakfast   finasteride   5 mg Oral Daily   Gerhardt's butt cream   Topical BID   insulin  aspart  0-9 Units Subcutaneous TID AC & HS   multivitamin with minerals  1 tablet Oral Daily   nystatin    Topical TID   pantoprazole   40 mg Oral QHS   tamsulosin   0.4 mg Oral Daily   thiamine  100 mg Oral Daily   Continuous Infusions:   ceFAZolin  (ANCEF ) IV 2 g (08/10/23 1329)   lactated ringers      potassium chloride  10 mEq (08/10/23 1246)   potassium chloride      PRN Meds: acetaminophen  **OR** acetaminophen ,  ondansetron  **OR** ondansetron  (ZOFRAN ) IV, mouth rinse, traZODone  Allergies:    Allergies  Allergen Reactions   Ramipril Anaphylaxis   Other Diarrhea    Severe intolerance to Chemotherapy in the past.   Sertraline Other (See Comments)    "Extreme headaches"   Adhesive [Tape] Rash    Social History:   Social History   Socioeconomic History   Marital status: Married    Spouse name: Not on file   Number of children: Not on file   Years of education: Not on file   Highest education level: Bachelor's degree (e.g., BA, AB, BS)  Occupational History   Not on file  Tobacco Use   Smoking status: Never   Smokeless tobacco: Never  Vaping Use   Vaping status: Never Used  Substance and Sexual Activity   Alcohol use: No   Drug use: No   Sexual activity: Not Currently    Partners: Female  Other Topics Concern   Not on file  Social History Narrative   Not on file   Social Drivers of Health   Financial Resource Strain: Low Risk  (04/07/2023)   Overall Financial Resource Strain (CARDIA)    Difficulty of Paying Living Expenses: Not hard at all  Food Insecurity: No Food Insecurity (08/07/2023)   Hunger Vital Sign    Worried About Running Out of Food in the Last Year: Never true    Ran Out of Food in the Last Year: Never true  Transportation Needs: No Transportation Needs (08/07/2023)   PRAPARE - Administrator, Civil Service (Medical): No    Lack of Transportation (Non-Medical): No  Physical Activity: Unknown (04/07/2023)   Exercise Vital Sign    Days of Exercise per Week: 0 days    Minutes of Exercise per Session: Not on file  Stress: No Stress Concern Present (04/07/2023)   [employer] of Occupational Health - Occupational Stress Questionnaire    Feeling of Stress : Not at all  Social Connections: Moderately Isolated (08/07/2023)   Social Connection and Isolation Panel [NHANES]    Frequency of Communication with Friends and Family: More than three times a  week    Frequency of Social Gatherings with Friends and Family: More than three times a week    Attends Religious Services: Never    Database administrator or Organizations: No    Attends Banker Meetings: Never    Marital Status: Married  Catering manager Violence: Not At Risk (08/07/2023)   Humiliation, Afraid, Rape, and Kick questionnaire    Fear of Current or Ex-Partner: No    Emotionally Abused: No    Physically Abused: No    Sexually Abused: No    Family History:    Family History  Problem  Relation Age of Onset   Lymphoma Mother    Cancer Mother    Diabetes Father    Stroke Father    Ovarian cancer Sister    Cancer Sister    Cancer Paternal Grandmother      ROS:  Please see the history of present illness.   All other ROS reviewed and negative.     Physical Exam/Data:   Vitals:   08/10/23 1105 08/10/23 1113 08/10/23 1115 08/10/23 1120  BP: 100/64 134/68 123/66 127/60  Pulse: 72 76 79 71  Resp: 20 16 17  (!) 21  Temp:  97.6 F (36.4 C)    TempSrc:  Axillary    SpO2: 99% 100% 100% 97%  Weight:      Height:        Intake/Output Summary (Last 24 hours) at 08/10/2023 1336 Last data filed at 08/10/2023 0915 Gross per 24 hour  Intake 396.96 ml  Output 1750 ml  Net -1353.04 ml      08/08/2023    4:00 AM 08/06/2023   10:50 PM 07/13/2023    9:49 AM  Last 3 Weights  Weight (lbs) 202 lb 2.6 oz 198 lb 6.6 oz 206 lb  Weight (kg) 91.7 kg 90 kg 93.441 kg     Body mass index is 27.42 kg/m.  General:  Chronically ill appearing older male, sitting up in bed HEENT: normal Neck: no JVD Vascular: No carotid bruits= Cardiac:  normal S1, S2; RRR; no murmur  Lungs:  clear to auscultation bilaterally Abd: soft, nontender Ext: no edema Musculoskeletal:  No deformities Skin: warm and dry  Neuro: no focal abnormalities noted Psych:  Normal affect   EKG:  The EKG was personally reviewed and demonstrates: 4/26 sinus tachycardia, 102 bpm, RBBB 4/30 sinus  rhythm, 76 bpm more pronounced TWI in anterolateral leads  Telemetry:  Telemetry was personally reviewed and demonstrates:  sinus rhythm, a-paced  Relevant CV Studies:  Echo: 04/2023  IMPRESSIONS     1. Left ventricular ejection fraction, by estimation, is 60 to 65%. Left  ventricular ejection fraction by 2D MOD biplane is 61.9 %. The left  ventricle has normal function. The left ventricle has no regional wall  motion abnormalities. Left ventricular  diastolic parameters are consistent with Grade I diastolic dysfunction  (impaired relaxation).   2. Right ventricular systolic function is normal. The right ventricular  size is normal. There is normal pulmonary artery systolic pressure. The  estimated right ventricular systolic pressure is 25.7 mmHg.   3. The mitral valve is grossly normal. No evidence of mitral valve  regurgitation.   4. The aortic valve is tricuspid. Aortic valve regurgitation is not  visualized. Aortic valve sclerosis/calcification is present, without any  evidence of aortic stenosis.   Comparison(s): No significant change from prior study. 11/14/2020: LVEF  60-65%.   FINDINGS   Left Ventricle: Left ventricular ejection fraction, by estimation, is 60  to 65%. Left ventricular ejection fraction by 2D MOD biplane is 61.9 %.  The left ventricle has normal function. The left ventricle has no regional  wall motion abnormalities. The  left ventricular internal cavity size was normal in size. There is no left  ventricular hypertrophy. Left ventricular diastolic parameters are  consistent with Grade I diastolic dysfunction (impaired relaxation).  Indeterminate filling pressures.   Right Ventricle: The right ventricular size is normal. No increase in  right ventricular wall thickness. Right ventricular systolic function is  normal. There is normal pulmonary artery systolic pressure. The tricuspid  regurgitant velocity is 2.38 m/s, and   with an assumed right atrial  pressure of 3 mmHg, the estimated right  ventricular systolic pressure is 25.7 mmHg.   Left Atrium: Left atrial size was normal in size.   Right Atrium: Right atrial size was normal in size.   Pericardium: There is no evidence of pericardial effusion.   Mitral Valve: The mitral valve is grossly normal. No evidence of mitral  valve regurgitation. MV peak gradient, 5.9 mmHg. The mean mitral valve  gradient is 2.0 mmHg.   Tricuspid Valve: The tricuspid valve is grossly normal. Tricuspid valve  regurgitation is trivial.   Aortic Valve: The aortic valve is tricuspid. Aortic valve regurgitation is  not visualized. Aortic valve sclerosis/calcification is present, without  any evidence of aortic stenosis. Aortic valve mean gradient measures 3.0  mmHg. Aortic valve peak gradient  measures 4.4 mmHg. Aortic valve area, by VTI measures 2.96 cm.   Pulmonic Valve: The pulmonic valve was grossly normal. Pulmonic valve  regurgitation is trivial.   Aorta: The aortic root and ascending aorta are structurally normal, with  no evidence of dilitation.   IAS/Shunts: No atrial level shunt detected by color flow Doppler.   Laboratory Data:  High Sensitivity Troponin:   Recent Labs  Lab 08/10/23 1159  TROPONINIHS 767*     Chemistry Recent Labs  Lab 08/08/23 0531 08/09/23 0503 08/10/23 0502 08/10/23 1153  NA 139 138 135 138  K 3.6 3.2* 3.0* 3.4*  CL 104 103 102 106  CO2 20* 22 25 21*  GLUCOSE 148* 140* 280* 301*  BUN 16 13 10 10   CREATININE 1.05 0.87 0.82 0.82  CALCIUM  8.5* 8.2* 8.3* 8.5*  MG 1.7 1.7 1.8  --   GFRNONAA >60 >60 >60 >60  ANIONGAP 15 13 8 11     Recent Labs  Lab 08/08/23 1452 08/09/23 0503 08/10/23 0502  PROT 5.8* 5.1* 5.0*  ALBUMIN 2.6* 2.3* 2.3*  AST 35 31 27  ALT 16 16 18   ALKPHOS 50 46 52  BILITOT 1.2 1.7* 1.2   Lipids No results for input(s): "CHOL", "TRIG", "HDL", "LABVLDL", "LDLCALC", "CHOLHDL" in the last 168 hours.  Hematology Recent Labs  Lab  08/08/23 0822 08/09/23 0503 08/10/23 0502  WBC 7.8 6.7 6.0  RBC 3.32* 3.57* 3.68*  HGB 9.3* 10.1* 10.2*  HCT 28.8* 30.1* 30.9*  MCV 86.7 84.3 84.0  MCH 28.0 28.3 27.7  MCHC 32.3 33.6 33.0  RDW 18.7* 18.5* 18.2*  PLT 239 240 236   Thyroid   Recent Labs  Lab 08/06/23 1713  TSH 0.710    BNP Recent Labs  Lab 08/06/23 1713  BNP 307.2*    DDimer No results for input(s): "DDIMER" in the last 168 hours.   Radiology/Studies:  CT HEAD WO CONTRAST ( ) Result Date: 08/10/2023 CLINICAL DATA:  Altered mental status, fell 1 week ago. EXAM: CT HEAD WITHOUT CONTRAST TECHNIQUE: Contiguous axial images were obtained from the base of the skull through the vertex without intravenous contrast. RADIATION DOSE REDUCTION: This exam was performed according to the departmental dose-optimization program which includes automated exposure control, adjustment of the mA and/or kV according to patient size and/or use of iterative reconstruction technique. COMPARISON:  08/06/2023 FINDINGS: Brain: No change or acute finding. Chronic small-vessel ischemic changes affect pons. 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 Chronic small-vessel ischemic changes of the white matter. No sign of acute infarction, mass lesion, hemorrhage, hydrocephalus or extra-axial collection. Vascular: There is atherosclerotic calcification of the major vessels at  the base of the brain. Skull: Negative Sinuses/Orbits: Clear/normal Other: None IMPRESSION: No acute finding by CT. Chronic small-vessel ischemic changes of the pons, thalami and cerebral hemispheric white matter. Electronically Signed   By: Bettylou Brunner M.D.   On: 08/10/2023 13:17   NM Hepato W/EF Result Date: 08/08/2023 CLINICAL DATA:  Vomiting, concern for cholecystitis. EXAM: NUCLEAR MEDICINE HEPATOBILIARY IMAGING WITH GALLBLADDER EF TECHNIQUE: Sequential images of the abdomen were obtained out to 60 minutes following intravenous administration of radiopharmaceutical. After  oral ingestion of Ensure, gallbladder ejection fraction was determined. At 60 min, normal ejection fraction is greater than 33%. RADIOPHARMACEUTICALS:  5.5 mCi Tc-51m  Choletec IV COMPARISON:  Ultrasound August 07, 2023 FINDINGS: Prompt uptake and biliary excretion of activity by the liver is seen. Gallbladder activity is visualized, consistent with patency of cystic duct. Biliary activity passes into small bowel, consistent with patent common bile duct. Calculated gallbladder ejection fraction is 4%. (Normal gallbladder ejection fraction with Ensure is greater than 33% and less than 80%.) IMPRESSION: Reduced gallbladder ejection fraction as can be seen with chronic cholecystitis/biliary dyskinesia. Electronically Signed   By: Tama Fails M.D.   On: 08/08/2023 15:18   US  Abdomen Limited RUQ (LIVER/GB) Result Date: 08/07/2023 CLINICAL DATA:  Sepsis. EXAM: ULTRASOUND ABDOMEN LIMITED RIGHT UPPER QUADRANT COMPARISON:  CT AP from 08/06/2023 FINDINGS: Gallbladder: There are multiple stones with sludge within the gallbladder. These measure up to 1 cm. The gallbladder wall thickness measures 3.2 mm. Negative sonographic Murphy's sign. No pericholecystic fluid. Common bile duct: Diameter: 3.1 mm.  No intrahepatic bile duct dilatation. Liver: No focal lesion identified. Within normal limits in parenchymal echogenicity. Portal vein is patent on color Doppler imaging with normal direction of blood flow towards the liver. Other: None. IMPRESSION: Cholelithiasis and gallbladder sludge. Mild gallbladder wall thickening. Negative sonographic Murphy's sign. Findings are equivocal for cholecystitis. If there is a high clinical suspicion for acute cholecystitis consider further evaluation with nuclear medicine HIDA scan. Electronically Signed   By: Kimberley Penman M.D.   On: 08/07/2023 07:50   CT ABDOMEN PELVIS W CONTRAST Result Date: 08/06/2023 CLINICAL DATA:  Abdominal pain, sepsis EXAM: CT ABDOMEN AND PELVIS WITH CONTRAST  TECHNIQUE: Multidetector CT imaging of the abdomen and pelvis was performed using the standard protocol following bolus administration of intravenous contrast. RADIATION DOSE REDUCTION: This exam was performed according to the departmental dose-optimization program which includes automated exposure control, adjustment of the mA and/or kV according to patient size and/or use of iterative reconstruction technique. CONTRAST:  75mL OMNIPAQUE  IOHEXOL  350 MG/ML SOLN COMPARISON:  08/29/2020 FINDINGS: Lower chest: Bibasilar dependent hypoventilatory changes. No acute pleural or parenchymal lung disease. Hepatobiliary: Marked gallbladder distension, with multiple calcified gallstones layering dependently. No gallbladder wall thickening or pericholecystic fluid to suggest cholecystitis. Small amount of free fluid along the dome of the liver. Liver parenchyma is unremarkable. No biliary duct dilation. Pancreas: Unremarkable. No pancreatic ductal dilatation or surrounding inflammatory changes. Spleen: Normal in size without focal abnormality. Adrenals/Urinary Tract: Kidneys enhance normally and symmetrically. No urinary tract calculi or obstructive uropathy within either kidney. Stable nodular thickening of the adrenal glands. Bladder is decompressed with a Foley catheter, limiting its evaluation. Stomach/Bowel: No bowel obstruction or ileus. Normal appendix right lower quadrant. No bowel wall thickening or inflammatory change. Vascular/Lymphatic: Aortic atherosclerosis. No enlarged abdominal or pelvic lymph nodes. Reproductive: Prostate is unremarkable. Other: Small amount of fluid along the dome of the liver. No free intraperitoneal gas. No abdominal wall hernia. Musculoskeletal: No acute or  destructive bony abnormalities. Reconstructed images demonstrate no additional findings. IMPRESSION: 1. Distended gallbladder, with multiple calcified gallstones layering dependently. No CT evidence of acute cholecystitis. 2. Trace free  fluid along the dome of the liver, nonspecific. 3.  Aortic Atherosclerosis (ICD10-I70.0). Electronically Signed   By: Bobbye Burrow M.D.   On: 08/06/2023 23:20   DG Chest Port 1 View Result Date: 08/06/2023 CLINICAL DATA:  Questionable sepsis.  Evaluate abnormality. EXAM: PORTABLE CHEST 1 VIEW COMPARISON:  Two-view chest x-ray 05/14/2023 FINDINGS: The heart size is normal. Pacing wires are stable. Mild pulmonary vascular congestion is present. Linear atelectasis is present in the right midlung. The lungs are otherwise clear. The visualized soft tissues and bony thorax are unremarkable. IMPRESSION: 1. Mild pulmonary vascular congestion. 2. Linear atelectasis in the right midlung. Electronically Signed   By: Audree Leas M.D.   On: 08/06/2023 18:50   CT Head Wo Contrast Result Date: 08/06/2023 CLINICAL DATA:  Head trauma. Ground level fall last night. Patient's wife was concerned for a stroke last night. Patient woke up more confused today with vomiting. EXAM: CT HEAD WITHOUT CONTRAST TECHNIQUE: Contiguous axial images were obtained from the base of the skull through the vertex without intravenous contrast. RADIATION DOSE REDUCTION: This exam was performed according to the departmental dose-optimization program which includes automated exposure control, adjustment of the mA and/or kV according to patient size and/or use of iterative reconstruction technique. COMPARISON:  CT head without contrast 05/08/2023. MR head without contrast/26/25. FINDINGS: Brain: Remote lacunar infarcts are again noted within the thalami bilaterally in the left caudate head. A remote lacunar infarct is Vascular: Acute dilation of the left lateral ventricle is noted. 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 No acute infarct, hemorrhage, or mass lesion is present. No significant extraaxial fluid collection is present. The brainstem and cerebellum are within normal limits.  Midline structures are within normal limits. Skull: Calvarium is intact. No focal lytic or blastic lesions are present. No significant extracranial soft tissue lesion is present. Sinuses/Orbits: The paranasal sinuses and mastoid air cells are clear. Bilateral lens replacements are noted. Globes and orbits are otherwise unremarkable. IMPRESSION: 1. No acute intracranial abnormality or significant interval change. 2. 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 3. 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 Electronically Signed   By: Audree Leas M.D.   On: 08/06/2023 18:49     Assessment and Plan:   Jie Mccoin is a 78 y.o. 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 Carie Charity.  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  He was remotely seen by cardiology through [facility] with reported just testing. -- Echo 04/2023 with LVEF of 60 to 65%, no wall motion abnormality, grade 1 diastolic dysfunction, normal RV, no significant valvular disease -- episode today of hypotension, clammy, weakness and emesis.  EKG showed sinus rhythm with more  T wave inversion in anterior lateral leads.  Initial high-sensitivity troponin 767.  He denies any chest pain prior to or after this event. Given his EKG changes and elevated  troponin, suspect he will need definitive ischemic evaluation with cardiac cath to rule out high grade obstructive disease. Will discuss with MD -- add ASA, continue statin  Sepsis  E Coli bacteremia Acute metabolic encephalopathy -- Encephalopathy now resolved -- Remains on IV antibiotics per primary  CHB s/p PPM -- Recently placed 04/2023 by Dr. Daneil Dunker -- Remote transmission 06/30/2023 with recommendations to have ventricular sensitivity adjusted to prevent under sensing.  He has not been able to keep his recent visits secondary to hospital admissions.  Will touch base with EP this admission to adjust device.  Hx of PE/DVT -- Eliquis  PTA last dose yesterday at 1300, transition to IV heparin  given upcoming plans for surgery  Per primary Urinary retention s/p chronic indwelling Foley--urology following Diabetes Iron deficiency anemia Hx of CVA  For questions or updates, please contact [facility] Please consult [email or web address] for contact info under    Signed, Johnie Nailer, NP  08/10/2023 1:36 PM

## 2023-08-10 NOTE — Interval H&P Note (Signed)
 History and Physical Interval Note:  08/10/2023 4:01 PM  John [name]  has presented today for surgery, with the diagnosis of Chest pain positive troponin.  The various methods of treatment have been discussed with the patient and family. After consideration of risks, benefits and other options for treatment, the patient has consented to  Procedure(s): LEFT HEART CATH AND CORONARY ANGIOGRAPHY (N/A) as a surgical intervention.  The patient's history has been reviewed, patient examined, no change in status, stable for surgery.  I have reviewed the patient's chart and labs.  Questions were answered to the patient's satisfaction.     Manpreet Kemmer

## 2023-08-10 NOTE — Progress Notes (Signed)
 [place], NP called and made aware of bladder spasm today with indwelling catheter. New order given.

## 2023-08-10 NOTE — Progress Notes (Signed)
 Mobility Specialist Progress Note;   08/10/23 0935  Mobility  Activity Ambulated with assistance in hallway  Level of Assistance Contact guard assist, steadying assist  Assistive Device Front wheel walker  Distance Ambulated (ft) 80 ft  Activity Response Tolerated well  Mobility Referral Yes  Mobility visit 1 Mobility  Mobility Specialist Start Time (ACUTE ONLY) 0935  Mobility Specialist Stop Time (ACUTE ONLY) 0954  Mobility Specialist Time Calculation (min) (ACUTE ONLY) 19 min   Pt agreeable to mobility. Required MinG assistance during ambulation for safety. VSS throughout and no c/o when asked. Agreeable to sitting up in chair at EOS. Pt left in chair with all needs met, alarm on. RN in room.   Janit Meline Mobility Specialist Please contact via SecureChat or [employer] [phone]

## 2023-08-10 NOTE — Consult Note (Addendum)
 As above, patient seen and examined.  Briefly he is a 78 year old male with past medical history of hypertension, diabetes mellitus, history of breast cancer, recent CVA, status post recent pacemaker for complete heart block admitted with confusion, sepsis and cholecystitis for evaluation prior to surgery.  Patient was in the chair earlier today and developed recurrent altered mental status for approximately 15 minutes.  He was noted to be hypotensive with systolic blood pressure into the 60s.  He was placed in bed and given IV fluids with improvement.  There was no preceding chest pain, dyspnea or nausea though he did vomit after the episode.  His troponin was checked which was 767 and electrocardiogram revealed new anterior T wave inversion.  Cardiology now asked to evaluate.  Head CT shows small vessel disease but no acute changes.  Creatinine 0.82, potassium 3.4, troponin 767, hemoglobin 10.2.  1 preoperative evaluation prior to cholecystectomy-patient had an episode of hypotension today associated with altered mental status.  His troponin is minimally elevated but more concerning is his electrocardiogram which shows new anterior T wave inversion (though he certainly could have underlying coronary disease with superimposed hypotension causing demand ischemia).  He also has 25 years of diabetes mellitus.  I think definitive evaluation is warranted.  Plan cardiac catheterization.  The risk and benefits including myocardial infarction, CVA and death discussed and he agrees to proceed.  Add aspirin  and continue heparin .  Adjust medications based on results.  2 elevated troponin-plan as outlined under #1.  3 episode of hypotension-etiology is unclear.  Episode was transient.  No arrhythmias on telemetry.  Will have device interrogated as well.  4 status post pacemaker-follow-up electrophysiology.  5 history of pulmonary embolus-would resume apixaban  once all procedures complete.  6 sepsis at time of  admission/cholecystitis-patient on antibiotics; per general surgery.  John Angel, MD      Cardiology Consultation   Patient ID: John [name] MRN: [id number]; DOB: 1945-07-08  Admit date: 08/06/2023 Date of Consult: 08/10/2023  PCP:  Jobe Mulder, DO   [facility] Providers Cardiologist:  None     Patient Profile:   John [name] is a 78 y.o. 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 Carie Charity.  History of Present Illness:   John [name] is a 78 year old male with past medical history noted above.  Seen remotely in 2016 by [facility] cardiology and underwent echocardiogram which showed normal LV function and Holter monitor with isolated PVCs.  Also reports having a stress test around that time which was normal.   He was released by inpatient rehab 05/07/2023 after hospitalization for a right cerebellar stroke.  He was to [facility] and underwent CTA of chest which showed small volume of occlusive and nonocclusive PE in the right lung and pulmonary artery branches with no right heart strain.  Lower extremity Doppler showed acute DVT involving the left common femoral vein.  He was initiated on Eliquis .  MRI was done showing punctate acute infarct in the right middle cerebellar pedicle and remote lunar infarcts.  This was evaluated by the neurology team and felt to be nonacute.  He was found to be orthostatic hypotensive which was felt to be the etiology of a recent fall and syncope and his antihypertensives were held.  Cardiology was  requested on 1/29 due to abrupt onset of severe bradycardia with associated hypotension.  Telemetry was reviewed and showed wide-complex rhythm at 29 bpm consistent with idioventricular rhythm.  Echocardiogram  1/28 with LVEF of 60 to 65%, no regional wall motion abnormalities, normal RV, mildly elevated PASP 38 mmHg, no significant valvular disease.  He was transferred to [facility] on 1/29 with successful temporary pacemaker placement.  Seen the following day by EP and underwent permanent pacemaker secondary to intermittent complete heart block.  He was ultimately discharged on 2/1 and resumed on his Eliquis  that evening.   Seen back in the ED 4/2 with complaints of urinary retention.  He was found to have UTI and treated with Keflex  with Foley catheter placed.  Presented back to the ED on 4/26 with complaints of fever.  Found to have altered mental status also had had mechanical fall the night prior but refused to go to the hospital.  He was admitted for sepsis with initial lactic acid being 7.4.  Blood cultures with E. coli. Workup revealed cholelithiasis and gallbladder sludge with mild wall thickening on right upper quadrant ultrasound.  He was evaluated by urology indwelling Foley and complicated UTI.  Of note he did complained of a several week history of postprandial nausea and vomiting.  Underwent US /HIDA scan with gallstones, gallbladder sludge, mild gallbladder wall thickening EF of 40%.  General surgery consulted with recommendations to proceed with laparoscopic cholecystectomy.  This was initially planned for today, but delayed secondary to other emergencies.  He had an episode this afternoon while sitting in the chair when he became weak, cool clammy and hypotensive.  Systolic blood pressures dropped into the 60s.he was placed back in the bed, given IV fluids and vomited shortly thereafterwards.  High-sensitivity troponin obtained and noted at 767. Cardiology asked to evaluate in regards to preoperative evaluation.  In talking with the patient and his wife, he reports that this is similar to the episode when he was admitted to [facility] recently.  Similar to episode that brought him in for initial  admission.  He denies any chest pain prior to these events.  He becomes disoriented during these episodes but later feels fine once BP improves.  He has been sick over the last several months with recent  hospital admission and his mobility has been quite limited.  Though denies any chest discomfort or anginal symptoms with activities.   Past Medical History:  Diagnosis Date   BPH (benign prostatic hyperplasia)    CTS (carpal tunnel syndrome)    Depression    Diabetes mellitus    Essential hypertension    GERD (gastroesophageal reflux disease)    History of breast cancer    Hypercholesteremia    Neuropathy     Past Surgical History:  Procedure Laterality Date   CATARACT EXTRACTION, BILATERAL  01/18/2017   LITHOTRIPSY     MASTECTOMY Right 05/02/2018   PACEMAKER IMPLANT N/A 05/13/2023   Procedure: PACEMAKER IMPLANT;  Surgeon: Ardeen Kohler, MD;  Location: [facility] INVASIVE CV LAB;  Service: Cardiovascular;  Laterality: N/A;   TEMPORARY PACEMAKER N/A 05/11/2023   Procedure: TEMPORARY PACEMAKER;  Surgeon: [place], Peter M, MD;  Location: [facility] INVASIVE CV LAB;  Service: Cardiovascular;  Laterality: N/A;   TONSILLECTOMY AND ADENOIDECTOMY       Inpatient Medications: Scheduled Meds:  atorvastatin   80 mg Oral QHS   Chlorhexidine  Gluconate Cloth  6 each Topical Daily   vitamin B-12  1,000 mcg Oral Daily   feeding supplement  1 Container Oral TID BM   ferrous sulfate  325 mg Oral Q breakfast   finasteride   5 mg Oral Daily   Gerhardt's butt cream   Topical BID   insulin  aspart  0-9 Units Subcutaneous TID AC & HS   multivitamin with minerals  1 tablet Oral Daily   nystatin    Topical TID   pantoprazole   40 mg Oral QHS   tamsulosin   0.4 mg Oral Daily   thiamine  100 mg Oral Daily   Continuous Infusions:   ceFAZolin  (ANCEF ) IV 2 g (08/10/23 1329)   lactated ringers      potassium chloride  10 mEq (08/10/23 1246)   potassium chloride      PRN Meds: acetaminophen  **OR** acetaminophen ,  ondansetron  **OR** ondansetron  (ZOFRAN ) IV, mouth rinse, traZODone  Allergies:    Allergies  Allergen Reactions   Ramipril Anaphylaxis   Other Diarrhea    Severe intolerance to Chemotherapy in the past.   Sertraline Other (See Comments)    "Extreme headaches"   Adhesive [Tape] Rash    Social History:   Social History   Socioeconomic History   Marital status: Married    Spouse name: Not on file   Number of children: Not on file   Years of education: Not on file   Highest education level: Bachelor's degree (e.g., BA, AB, BS)  Occupational History   Not on file  Tobacco Use   Smoking status: Never   Smokeless tobacco: Never  Vaping Use   Vaping status: Never Used  Substance and Sexual Activity   Alcohol use: No   Drug use: No   Sexual activity: Not Currently    Partners: Female  Other Topics Concern   Not on file  Social History Narrative   Not on file   Social Drivers of Health   Financial Resource Strain: Low Risk  (04/07/2023)   Overall Financial Resource Strain (CARDIA)    Difficulty of Paying Living Expenses: Not hard at all  Food Insecurity: No Food Insecurity (08/07/2023)   Hunger Vital Sign    Worried About Running Out of Food in the Last Year: Never true    Ran Out of Food in the Last Year: Never true  Transportation Needs: No Transportation Needs (08/07/2023)   PRAPARE - Administrator, Civil Service (Medical): No    Lack of Transportation (Non-Medical): No  Physical Activity: Unknown (04/07/2023)   Exercise Vital Sign    Days of Exercise per Week: 0 days    Minutes of Exercise per Session: Not on file  Stress: No Stress Concern Present (04/07/2023)   [employer] of Occupational Health - Occupational Stress Questionnaire    Feeling of Stress : Not at all  Social Connections: Moderately Isolated (08/07/2023)   Social Connection and Isolation Panel [NHANES]    Frequency of Communication with Friends and Family: More than three times a  week    Frequency of Social Gatherings with Friends and Family: More than three times a week    Attends Religious Services: Never    Database administrator or Organizations: No    Attends Banker Meetings: Never    Marital Status: Married  Catering manager Violence: Not At Risk (08/07/2023)   Humiliation, Afraid, Rape, and Kick questionnaire    Fear of Current or Ex-Partner: No    Emotionally Abused: No    Physically Abused: No    Sexually Abused: No    Family History:    Family History  Problem  Relation Age of Onset   Lymphoma Mother    Cancer Mother    Diabetes Father    Stroke Father    Ovarian cancer Sister    Cancer Sister    Cancer Paternal Grandmother      ROS:  Please see the history of present illness.   All other ROS reviewed and negative.     Physical Exam/Data:   Vitals:   08/10/23 1105 08/10/23 1113 08/10/23 1115 08/10/23 1120  BP: 100/64 134/68 123/66 127/60  Pulse: 72 76 79 71  Resp: 20 16 17  (!) 21  Temp:  97.6 F (36.4 C)    TempSrc:  Axillary    SpO2: 99% 100% 100% 97%  Weight:      Height:        Intake/Output Summary (Last 24 hours) at 08/10/2023 1336 Last data filed at 08/10/2023 0915 Gross per 24 hour  Intake 396.96 ml  Output 1750 ml  Net -1353.04 ml      08/08/2023    4:00 AM 08/06/2023   10:50 PM 07/13/2023    9:49 AM  Last 3 Weights  Weight (lbs) 202 lb 2.6 oz 198 lb 6.6 oz 206 lb  Weight (kg) 91.7 kg 90 kg 93.441 kg     Body mass index is 27.42 kg/m.  General:  Chronically ill appearing older male, sitting up in bed HEENT: normal Neck: no JVD Vascular: No carotid bruits= Cardiac:  normal S1, S2; RRR; no murmur  Lungs:  clear to auscultation bilaterally Abd: soft, nontender Ext: no edema Musculoskeletal:  No deformities Skin: warm and dry  Neuro: no focal abnormalities noted Psych:  Normal affect   EKG:  The EKG was personally reviewed and demonstrates: 4/26 sinus tachycardia, 102 bpm, RBBB 4/30 sinus  rhythm, 76 bpm more pronounced TWI in anterolateral leads  Telemetry:  Telemetry was personally reviewed and demonstrates:  sinus rhythm, a-paced  Relevant CV Studies:  Echo: 04/2023  IMPRESSIONS     1. Left ventricular ejection fraction, by estimation, is 60 to 65%. Left  ventricular ejection fraction by 2D MOD biplane is 61.9 %. The left  ventricle has normal function. The left ventricle has no regional wall  motion abnormalities. Left ventricular  diastolic parameters are consistent with Grade I diastolic dysfunction  (impaired relaxation).   2. Right ventricular systolic function is normal. The right ventricular  size is normal. There is normal pulmonary artery systolic pressure. The  estimated right ventricular systolic pressure is 25.7 mmHg.   3. The mitral valve is grossly normal. No evidence of mitral valve  regurgitation.   4. The aortic valve is tricuspid. Aortic valve regurgitation is not  visualized. Aortic valve sclerosis/calcification is present, without any  evidence of aortic stenosis.   Comparison(s): No significant change from prior study. 11/14/2020: LVEF  60-65%.   FINDINGS   Left Ventricle: Left ventricular ejection fraction, by estimation, is 60  to 65%. Left ventricular ejection fraction by 2D MOD biplane is 61.9 %.  The left ventricle has normal function. The left ventricle has no regional  wall motion abnormalities. The  left ventricular internal cavity size was normal in size. There is no left  ventricular hypertrophy. Left ventricular diastolic parameters are  consistent with Grade I diastolic dysfunction (impaired relaxation).  Indeterminate filling pressures.   Right Ventricle: The right ventricular size is normal. No increase in  right ventricular wall thickness. Right ventricular systolic function is  normal. There is normal pulmonary artery systolic pressure. The tricuspid  regurgitant velocity is 2.38 m/s, and   with an assumed right atrial  pressure of 3 mmHg, the estimated right  ventricular systolic pressure is 25.7 mmHg.   Left Atrium: Left atrial size was normal in size.   Right Atrium: Right atrial size was normal in size.   Pericardium: There is no evidence of pericardial effusion.   Mitral Valve: The mitral valve is grossly normal. No evidence of mitral  valve regurgitation. MV peak gradient, 5.9 mmHg. The mean mitral valve  gradient is 2.0 mmHg.   Tricuspid Valve: The tricuspid valve is grossly normal. Tricuspid valve  regurgitation is trivial.   Aortic Valve: The aortic valve is tricuspid. Aortic valve regurgitation is  not visualized. Aortic valve sclerosis/calcification is present, without  any evidence of aortic stenosis. Aortic valve mean gradient measures 3.0  mmHg. Aortic valve peak gradient  measures 4.4 mmHg. Aortic valve area, by VTI measures 2.96 cm.   Pulmonic Valve: The pulmonic valve was grossly normal. Pulmonic valve  regurgitation is trivial.   Aorta: The aortic root and ascending aorta are structurally normal, with  no evidence of dilitation.   IAS/Shunts: No atrial level shunt detected by color flow Doppler.   Laboratory Data:  High Sensitivity Troponin:   Recent Labs  Lab 08/10/23 1159  TROPONINIHS 767*     Chemistry Recent Labs  Lab 08/08/23 0531 08/09/23 0503 08/10/23 0502 08/10/23 1153  NA 139 138 135 138  K 3.6 3.2* 3.0* 3.4*  CL 104 103 102 106  CO2 20* 22 25 21*  GLUCOSE 148* 140* 280* 301*  BUN 16 13 10 10   CREATININE 1.05 0.87 0.82 0.82  CALCIUM  8.5* 8.2* 8.3* 8.5*  MG 1.7 1.7 1.8  --   GFRNONAA >60 >60 >60 >60  ANIONGAP 15 13 8 11     Recent Labs  Lab 08/08/23 1452 08/09/23 0503 08/10/23 0502  PROT 5.8* 5.1* 5.0*  ALBUMIN 2.6* 2.3* 2.3*  AST 35 31 27  ALT 16 16 18   ALKPHOS 50 46 52  BILITOT 1.2 1.7* 1.2   Lipids No results for input(s): "CHOL", "TRIG", "HDL", "LABVLDL", "LDLCALC", "CHOLHDL" in the last 168 hours.  Hematology Recent Labs  Lab  08/08/23 0822 08/09/23 0503 08/10/23 0502  WBC 7.8 6.7 6.0  RBC 3.32* 3.57* 3.68*  HGB 9.3* 10.1* 10.2*  HCT 28.8* 30.1* 30.9*  MCV 86.7 84.3 84.0  MCH 28.0 28.3 27.7  MCHC 32.3 33.6 33.0  RDW 18.7* 18.5* 18.2*  PLT 239 240 236   Thyroid   Recent Labs  Lab 08/06/23 1713  TSH 0.710    BNP Recent Labs  Lab 08/06/23 1713  BNP 307.2*    DDimer No results for input(s): "DDIMER" in the last 168 hours.   Radiology/Studies:  CT HEAD WO CONTRAST ( ) Result Date: 08/10/2023 CLINICAL DATA:  Altered mental status, fell 1 week ago. EXAM: CT HEAD WITHOUT CONTRAST TECHNIQUE: Contiguous axial images were obtained from the base of the skull through the vertex without intravenous contrast. RADIATION DOSE REDUCTION: This exam was performed according to the departmental dose-optimization program which includes automated exposure control, adjustment of the mA and/or kV according to patient size and/or use of iterative reconstruction technique. COMPARISON:  08/06/2023 FINDINGS: Brain: No change or acute finding. Chronic small-vessel ischemic changes affect pons. 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 Chronic small-vessel ischemic changes of the white matter. No sign of acute infarction, mass lesion, hemorrhage, hydrocephalus or extra-axial collection. Vascular: There is atherosclerotic calcification of the major vessels at  the base of the brain. Skull: Negative Sinuses/Orbits: Clear/normal Other: None IMPRESSION: No acute finding by CT. Chronic small-vessel ischemic changes of the pons, thalami and cerebral hemispheric white matter. Electronically Signed   By: Bettylou Brunner M.D.   On: 08/10/2023 13:17   NM Hepato W/EF Result Date: 08/08/2023 CLINICAL DATA:  Vomiting, concern for cholecystitis. EXAM: NUCLEAR MEDICINE HEPATOBILIARY IMAGING WITH GALLBLADDER EF TECHNIQUE: Sequential images of the abdomen were obtained out to 60 minutes following intravenous administration of radiopharmaceutical. After  oral ingestion of Ensure, gallbladder ejection fraction was determined. At 60 min, normal ejection fraction is greater than 33%. RADIOPHARMACEUTICALS:  5.5 mCi Tc-51m  Choletec IV COMPARISON:  Ultrasound August 07, 2023 FINDINGS: Prompt uptake and biliary excretion of activity by the liver is seen. Gallbladder activity is visualized, consistent with patency of cystic duct. Biliary activity passes into small bowel, consistent with patent common bile duct. Calculated gallbladder ejection fraction is 4%. (Normal gallbladder ejection fraction with Ensure is greater than 33% and less than 80%.) IMPRESSION: Reduced gallbladder ejection fraction as can be seen with chronic cholecystitis/biliary dyskinesia. Electronically Signed   By: Tama Fails M.D.   On: 08/08/2023 15:18   US  Abdomen Limited RUQ (LIVER/GB) Result Date: 08/07/2023 CLINICAL DATA:  Sepsis. EXAM: ULTRASOUND ABDOMEN LIMITED RIGHT UPPER QUADRANT COMPARISON:  CT AP from 08/06/2023 FINDINGS: Gallbladder: There are multiple stones with sludge within the gallbladder. These measure up to 1 cm. The gallbladder wall thickness measures 3.2 mm. Negative sonographic Murphy's sign. No pericholecystic fluid. Common bile duct: Diameter: 3.1 mm.  No intrahepatic bile duct dilatation. Liver: No focal lesion identified. Within normal limits in parenchymal echogenicity. Portal vein is patent on color Doppler imaging with normal direction of blood flow towards the liver. Other: None. IMPRESSION: Cholelithiasis and gallbladder sludge. Mild gallbladder wall thickening. Negative sonographic Murphy's sign. Findings are equivocal for cholecystitis. If there is a high clinical suspicion for acute cholecystitis consider further evaluation with nuclear medicine HIDA scan. Electronically Signed   By: Kimberley Penman M.D.   On: 08/07/2023 07:50   CT ABDOMEN PELVIS W CONTRAST Result Date: 08/06/2023 CLINICAL DATA:  Abdominal pain, sepsis EXAM: CT ABDOMEN AND PELVIS WITH CONTRAST  TECHNIQUE: Multidetector CT imaging of the abdomen and pelvis was performed using the standard protocol following bolus administration of intravenous contrast. RADIATION DOSE REDUCTION: This exam was performed according to the departmental dose-optimization program which includes automated exposure control, adjustment of the mA and/or kV according to patient size and/or use of iterative reconstruction technique. CONTRAST:  75mL OMNIPAQUE  IOHEXOL  350 MG/ML SOLN COMPARISON:  08/29/2020 FINDINGS: Lower chest: Bibasilar dependent hypoventilatory changes. No acute pleural or parenchymal lung disease. Hepatobiliary: Marked gallbladder distension, with multiple calcified gallstones layering dependently. No gallbladder wall thickening or pericholecystic fluid to suggest cholecystitis. Small amount of free fluid along the dome of the liver. Liver parenchyma is unremarkable. No biliary duct dilation. Pancreas: Unremarkable. No pancreatic ductal dilatation or surrounding inflammatory changes. Spleen: Normal in size without focal abnormality. Adrenals/Urinary Tract: Kidneys enhance normally and symmetrically. No urinary tract calculi or obstructive uropathy within either kidney. Stable nodular thickening of the adrenal glands. Bladder is decompressed with a Foley catheter, limiting its evaluation. Stomach/Bowel: No bowel obstruction or ileus. Normal appendix right lower quadrant. No bowel wall thickening or inflammatory change. Vascular/Lymphatic: Aortic atherosclerosis. No enlarged abdominal or pelvic lymph nodes. Reproductive: Prostate is unremarkable. Other: Small amount of fluid along the dome of the liver. No free intraperitoneal gas. No abdominal wall hernia. Musculoskeletal: No acute or  destructive bony abnormalities. Reconstructed images demonstrate no additional findings. IMPRESSION: 1. Distended gallbladder, with multiple calcified gallstones layering dependently. No CT evidence of acute cholecystitis. 2. Trace free  fluid along the dome of the liver, nonspecific. 3.  Aortic Atherosclerosis (ICD10-I70.0). Electronically Signed   By: Bobbye Burrow M.D.   On: 08/06/2023 23:20   DG Chest Port 1 View Result Date: 08/06/2023 CLINICAL DATA:  Questionable sepsis.  Evaluate abnormality. EXAM: PORTABLE CHEST 1 VIEW COMPARISON:  Two-view chest x-ray 05/14/2023 FINDINGS: The heart size is normal. Pacing wires are stable. Mild pulmonary vascular congestion is present. Linear atelectasis is present in the right midlung. The lungs are otherwise clear. The visualized soft tissues and bony thorax are unremarkable. IMPRESSION: 1. Mild pulmonary vascular congestion. 2. Linear atelectasis in the right midlung. Electronically Signed   By: Audree Leas M.D.   On: 08/06/2023 18:50   CT Head Wo Contrast Result Date: 08/06/2023 CLINICAL DATA:  Head trauma. Ground level fall last night. Patient's wife was concerned for a stroke last night. Patient woke up more confused today with vomiting. EXAM: CT HEAD WITHOUT CONTRAST TECHNIQUE: Contiguous axial images were obtained from the base of the skull through the vertex without intravenous contrast. RADIATION DOSE REDUCTION: This exam was performed according to the departmental dose-optimization program which includes automated exposure control, adjustment of the mA and/or kV according to patient size and/or use of iterative reconstruction technique. COMPARISON:  CT head without contrast 05/08/2023. MR head without contrast/26/25. FINDINGS: Brain: Remote lacunar infarcts are again noted within the thalami bilaterally in the left caudate head. A remote lacunar infarct is Vascular: Acute dilation of the left lateral ventricle is noted. 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 No acute infarct, hemorrhage, or mass lesion is present. No significant extraaxial fluid collection is present. The brainstem and cerebellum are within normal limits.  Midline structures are within normal limits. Skull: Calvarium is intact. No focal lytic or blastic lesions are present. No significant extracranial soft tissue lesion is present. Sinuses/Orbits: The paranasal sinuses and mastoid air cells are clear. Bilateral lens replacements are noted. Globes and orbits are otherwise unremarkable. IMPRESSION: 1. No acute intracranial abnormality or significant interval change. 2. 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 3. 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 Electronically Signed   By: Audree Leas M.D.   On: 08/06/2023 18:49     Assessment and Plan:   John [name] is a 78 y.o. 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 Carie Charity.  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  He was remotely seen by cardiology through [facility] with reported just testing. -- Echo 04/2023 with LVEF of 60 to 65%, no wall motion abnormality, grade 1 diastolic dysfunction, normal RV, no significant valvular disease -- episode today of hypotension, clammy, weakness and emesis.  EKG showed sinus rhythm with more  T wave inversion in anterior lateral leads.  Initial high-sensitivity troponin 767.  He denies any chest pain prior to or after this event. Given his EKG changes and elevated  troponin, suspect he will need definitive ischemic evaluation with cardiac cath to rule out high grade obstructive disease. Will discuss with MD -- add ASA, continue statin  Sepsis  E Coli bacteremia Acute metabolic encephalopathy -- Encephalopathy now resolved -- Remains on IV antibiotics per primary  CHB s/p PPM -- Recently placed 04/2023 by Dr. Daneil Dunker -- Remote transmission 06/30/2023 with recommendations to have ventricular sensitivity adjusted to prevent under sensing.  He has not been able to keep his recent visits secondary to hospital admissions.  Will touch base with EP this admission to adjust device.  Hx of PE/DVT -- Eliquis  PTA last dose yesterday at 1300, transition to IV heparin  given upcoming plans for surgery  Per primary Urinary retention s/p chronic indwelling Foley--urology following Diabetes Iron deficiency anemia Hx of CVA  For questions or updates, please contact [facility] Please consult [email or web address] for contact info under    Signed, Johnie Nailer, NP  08/10/2023 1:36 PM

## 2023-08-10 NOTE — Plan of Care (Signed)

## 2023-08-10 NOTE — Progress Notes (Signed)
 PHARMACY - ANTICOAGULATION CONSULT NOTE  Pharmacy Consult for IV heparin  Indication: hx of pulmonary embolus  Patient Measurements: Height: 6' (182.9 cm) Weight: 91.7 kg (202 lb 2.6 oz) IBW/kg (Calculated) : 77.6 HEPARIN  DW (KG): 90  Vital Signs: Temp: 97.9 F (36.6 C) (04/30 1927) Temp Source: Oral (04/30 1927) BP: 126/77 (04/30 1927) Pulse Rate: 92 (04/30 2200)  Labs: Recent Labs    08/08/23 0822 08/09/23 0503 08/09/23 0845 08/09/23 2100 08/10/23 0502 08/10/23 1153 08/10/23 1159 08/10/23 1411  HGB 9.3* 10.1*  --   --  10.2*  --   --   --   HCT 28.8* 30.1*  --   --  30.9*  --   --   --   PLT 239 240  --   --  236  --   --   --   APTT  --   --  46* 126* 125*  --   --   --   HEPARINUNFRC  --   --  >1.10*  --  0.98*  --   --   --   CREATININE  --  0.87  --   --  0.82 0.82  --   --   TROPONINIHS  --   --   --   --   --   --  767* 750*    Estimated Creatinine Clearance: 82.8 mL/min (by C-G formula based on SCr of 0.82 mg/dL).   Medical History: Past Medical History:  Diagnosis Date   BPH (benign prostatic hyperplasia)    CTS (carpal tunnel syndrome)    Depression    Diabetes mellitus    Essential hypertension    GERD (gastroesophageal reflux disease)    History of breast cancer    Hypercholesteremia    Neuropathy    Assessment: 78 yo male on chronic Eliquis  for hx PE, pharmacy asked to start IV Heparin  while Eliquis  is on hold for possible procedures.  Last dose given 4/28 at 1338 PM.  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  Heparin  to start 2h post TR band removal (off @2150  per RN) for PE.  Per cath report, plan to stop bASA when DOAC initiated and continue plavix  for at least 58mo.  Cholecystectomy  will need to be postponed.    Goal of Therapy:  Heparin  level 0.3-0.7 units/ml, aPTT 66-102 seconds Monitor platelets by anticoagulation protocol: Yes   Plan:  -RESTART heparin  IV 1100 units/hr at 0000 -Heparin  level and aPTT in 8 hrs -Heparin  to be off at 9am on 5/1 --- will need to clarify with day team given cath findings  Cecillia Cogan, PharmD Clinical Pharmacist 08/10/2023  10:23 PM

## 2023-08-10 NOTE — Progress Notes (Signed)
 Patient's bed was saturated with urine this morning despite having an indwelling catheter with good output. No complaints of spasms or discomfort. Will continue to monitor. Attending made aware.

## 2023-08-10 NOTE — Plan of Care (Signed)
  Problem: Fluid Volume: Goal: Hemodynamic stability will improve Outcome: Not Progressing   Problem: Cardiovascular: Goal: Ability to achieve and maintain adequate cardiovascular perfusion will improve Outcome: Progressing Goal: Vascular access site(s) Level 0-1 will be maintained Outcome: Progressing   Problem: Health Behavior/Discharge Planning: Goal: Ability to safely manage health-related needs after discharge will improve Outcome: Progressing

## 2023-08-10 NOTE — Progress Notes (Signed)
 Date and time results received: 08/10/23  (use smartphrase ".now" to insert current time)  Test: troponin Critical Value: 767  Name of Provider Notified: Regalado  Orders Received? Or Actions Taken?: Actions Taken:

## 2023-08-10 NOTE — Significant Event (Addendum)
 Rapid Response Event Note   Reason for Call :  Hypotension (67/46), symptomatic, while sitting in chair. Cold, clammy, pale, drowsy, disoriented.  Initial Focused Assessment:  Pt in bedside recliner, head now laid flat. Weak pulses. Skin pale, cool, clammy. Pt is now oriented, responses are delayed. He endorses lightheadedness and dizziness. Denies headache, pain. Endorses bowel movement yesterday. Denies abdominal pain, some tenderness with palpation.  Yellow emesis x1.  Pt face symmetrical, moving all extremities equally.   VS: T 97.46F, BP 67/46 (54), HR 69, RR 25, SpO2 93% on room air CBG: 231  Interventions:  -Hoyer lift used to get pt back to bed -EKG  -IVF bolus -Additional orders per MD  Plan of Care:  -Once back in bed and after starting IVF, pt now back to baseline. BP 134/68. -Bedrest -RN updating CCS  Event Summary:  MD Notified: Dr. Landrum Pink, at bedside  Call Time: 1053 Arrival Time: 1057 End Time: 1120  Washington Hacker, RN

## 2023-08-10 NOTE — Plan of Care (Signed)
Patient hypotensive

## 2023-08-10 NOTE — Progress Notes (Addendum)
 PHARMACY - ANTICOAGULATION CONSULT NOTE  Pharmacy Consult for IV heparin  Indication: hx of pulmonary embolus  Patient Measurements: Height: 6' (182.9 cm) Weight: 91.7 kg (202 lb 2.6 oz) IBW/kg (Calculated) : 77.6 HEPARIN  DW (KG): 90  Vital Signs: Temp: 98 F (36.7 C) (04/30 1358) Temp Source: Oral (04/30 1358) BP: 129/82 (04/30 1358) Pulse Rate: 71 (04/30 1358)  Labs: Recent Labs    08/08/23 0822 08/09/23 0503 08/09/23 0845 08/09/23 2100 08/10/23 0502 08/10/23 1153 08/10/23 1159 08/10/23 1411  HGB 9.3* 10.1*  --   --  10.2*  --   --   --   HCT 28.8* 30.1*  --   --  30.9*  --   --   --   PLT 239 240  --   --  236  --   --   --   APTT  --   --  46* 126* 125*  --   --   --   HEPARINUNFRC  --   --  >1.10*  --  0.98*  --   --   --   CREATININE  --  0.87  --   --  0.82 0.82  --   --   TROPONINIHS  --   --   --   --   --   --  767* 750*    Estimated Creatinine Clearance: 82.8 mL/min (by C-G formula based on SCr of 0.82 mg/dL).   Medical History: Past Medical History:  Diagnosis Date   BPH (benign prostatic hyperplasia)    CTS (carpal tunnel syndrome)    Depression    Diabetes mellitus    Essential hypertension    GERD (gastroesophageal reflux disease)    History of breast cancer    Hypercholesteremia    Neuropathy    Assessment: 78 yo male on chronic Eliquis  for hx PE, pharmacy asked to start IV Heparin  while Eliquis  is on hold for possible procedures.  Last dose given 4/28 at 1338 PM.  -heparin  stopped earlier today for procedure which has been rescheduled for 5/1 ~ 1pm -will need to hold heparin  4 hours prior to procedure -last heparin  rate was 1250 units/hr and aPTT was 125 and heparin  was held after that for procedure  Goal of Therapy:  Heparin  level 0.3-0.7 units/ml, aPTT 66-102 seconds Monitor platelets by anticoagulation protocol: Yes   Plan:  -Resume heparin  at 1100 units/hr -heparin  level and aPTT in 8 hrs -Heparin  to be off at 9am on  5/1  Baxter Limber, PharmD Clinical Pharmacist **Pharmacist phone directory can now be found on [email or web address] ([email or web address]).  Listed under [facility].

## 2023-08-10 NOTE — Progress Notes (Signed)
 Date and time results received: 08/10/23  (use smartphrase ".now" to insert current time)  Test: lactic acid Critical Value: 2.1  Name of Provider Notified: Dr. Carie Charity  Orders Received? Or Actions Taken?: none

## 2023-08-10 NOTE — Progress Notes (Signed)
 OT Cancellation Note  Patient Details Name: John [name] MRN: [id number] DOB: [birth year]   Cancelled Treatment:    Reason Eval/Treat Not Completed: Patient at procedure or test/ unavailable (Pt currently off unit for L heart cath and coronary angiography. OT to reattempt to see pt at a later time as appropriate/available.)  Ivania Teagarden "Jenine Mix" M., OTR/L, MA Acute Rehab [phone]   Walt Gunner 08/10/2023, 4:07 PM

## 2023-08-11 ENCOUNTER — Encounter (HOSPITAL_COMMUNITY)
Admission: EM | Disposition: A | Discharge status: Rehabilitation Facility | Payer: Self-pay | Source: Home / Self Care | Attending: Internal Medicine

## 2023-08-11 ENCOUNTER — Encounter (HOSPITAL_COMMUNITY): Payer: Self-pay | Admitting: Anesthesiology

## 2023-08-11 ENCOUNTER — Inpatient Hospital Stay (HOSPITAL_COMMUNITY)

## 2023-08-11 ENCOUNTER — Encounter (HOSPITAL_COMMUNITY): Payer: Self-pay | Admitting: Cardiovascular Disease

## 2023-08-11 ENCOUNTER — Ambulatory Visit: Admitting: Physician Assistant

## 2023-08-11 DIAGNOSIS — A419 Sepsis, unspecified organism: Secondary | ICD-10-CM | POA: Diagnosis not present

## 2023-08-11 DIAGNOSIS — I214 Non-ST elevation (NSTEMI) myocardial infarction: Secondary | ICD-10-CM | POA: Diagnosis not present

## 2023-08-11 DIAGNOSIS — R6520 Severe sepsis without septic shock: Secondary | ICD-10-CM | POA: Diagnosis not present

## 2023-08-11 LAB — COMPREHENSIVE METABOLIC PANEL WITH GFR
ALT: 14 U/L (ref 0–44)
AST: 20 U/L (ref 15–41)
Albumin: 2.3 g/dL — ABNORMAL LOW (ref 3.5–5.0)
Alkaline Phosphatase: 44 U/L (ref 38–126)
Anion gap: 10 (ref 5–15)
BUN: 7 mg/dL — ABNORMAL LOW (ref 8–23)
CO2: 24 mmol/L (ref 22–32)
Calcium: 8.3 mg/dL — ABNORMAL LOW (ref 8.9–10.3)
Chloride: 105 mmol/L (ref 98–111)
Creatinine, Ser: 0.74 mg/dL (ref 0.61–1.24)
GFR, Estimated: >60 mL/min (ref >60)
Glucose, Bld: 160 mg/dL — ABNORMAL HIGH (ref 70–99)
Potassium: 3.1 mmol/L — ABNORMAL LOW (ref 3.5–5.1)
Sodium: 139 mmol/L (ref 135–145)
Total Bilirubin: 1.2 mg/dL (ref 0.0–1.2)
Total Protein: 4.9 g/dL — ABNORMAL LOW (ref 6.5–8.1)

## 2023-08-11 LAB — CBC WITH DIFFERENTIAL/PLATELET
Abs Immature Granulocytes: 0.14 10*3/uL — ABNORMAL HIGH (ref 0.00–0.07)
Basophils Absolute: 0.1 10*3/uL (ref 0.0–0.1)
Basophils Relative: 1 %
Eosinophils Absolute: 0.3 10*3/uL (ref 0.0–0.5)
Eosinophils Relative: 5 %
HCT: 28.8 % — ABNORMAL LOW (ref 39.0–52.0)
Hemoglobin: 9.5 g/dL — ABNORMAL LOW (ref 13.0–17.0)
Immature Granulocytes: 2 %
Lymphocytes Relative: 27 %
Lymphs Abs: 1.8 10*3/uL (ref 0.7–4.0)
MCH: 28 pg (ref 26.0–34.0)
MCHC: 33 g/dL (ref 30.0–36.0)
MCV: 85 fL (ref 80.0–100.0)
Monocytes Absolute: 0.6 10*3/uL (ref 0.1–1.0)
Monocytes Relative: 9 %
Neutro Abs: 3.9 10*3/uL (ref 1.7–7.7)
Neutrophils Relative %: 56 %
Platelets: 245 10*3/uL (ref 150–400)
RBC: 3.39 MIL/uL — ABNORMAL LOW (ref 4.22–5.81)
RDW: 18.3 % — ABNORMAL HIGH (ref 11.5–15.5)
WBC: 6.8 10*3/uL (ref 4.0–10.5)
nRBC: 0 % (ref 0.0–0.2)

## 2023-08-11 LAB — ECHOCARDIOGRAM COMPLETE
AR max vel: 2.24 cm2
AV Area VTI: 2.34 cm2
AV Area mean vel: 2.06 cm2
AV Mean grad: 3 mmHg
AV Peak grad: 6.5 mmHg
Ao pk vel: 1.27 m/s
Area-P 1/2: 3.81 cm2
Calc EF: 35.8 %
Height: 72 in
S' Lateral: 3.7 cm
Single Plane A2C EF: 41.8 %
Single Plane A4C EF: 27.9 %
Weight: 3234.59 [oz_av]

## 2023-08-11 LAB — GLUCOSE, CAPILLARY
Glucose-Capillary: 167 mg/dL — ABNORMAL HIGH (ref 70–99)
Glucose-Capillary: 184 mg/dL — ABNORMAL HIGH (ref 70–99)
Glucose-Capillary: 263 mg/dL — ABNORMAL HIGH (ref 70–99)
Glucose-Capillary: 308 mg/dL — ABNORMAL HIGH (ref 70–99)

## 2023-08-11 LAB — APTT: aPTT: 79 s — ABNORMAL HIGH (ref 24–36)

## 2023-08-11 LAB — MAGNESIUM: Magnesium: 2 mg/dL (ref 1.7–2.4)

## 2023-08-11 LAB — PHOSPHORUS: Phosphorus: 2.2 mg/dL — ABNORMAL LOW (ref 2.5–4.6)

## 2023-08-11 LAB — HEPARIN LEVEL (UNFRACTIONATED): Heparin Unfractionated: 0.55 [IU]/mL (ref 0.30–0.70)

## 2023-08-11 SURGERY — LAPAROSCOPIC CHOLECYSTECTOMY
Anesthesia: General

## 2023-08-11 MED ORDER — POTASSIUM CHLORIDE 10 MEQ/100ML IV SOLN: 10.0000 meq | INTRAVENOUS | Status: DC

## 2023-08-11 MED ORDER — APIXABAN 5 MG PO TABS
5.0000 mg | ORAL_TABLET | Freq: Two times a day (BID) | ORAL | Status: DC
  Administered 2023-08-11 – 2023-08-13 (×5): 5 mg via ORAL
  Filled 2023-08-11 (×5): qty 1

## 2023-08-11 MED ORDER — POTASSIUM CHLORIDE CRYS ER 20 MEQ PO TBCR
40.0000 meq | EXTENDED_RELEASE_TABLET | Freq: Once | ORAL | Status: AC
  Administered 2023-08-11: 40 meq via ORAL
  Filled 2023-08-11: qty 2

## 2023-08-11 MED ORDER — POTASSIUM PHOSPHATES 15 MMOLE/5ML IV SOLN
15.0000 mmol | Freq: Once | INTRAVENOUS | Status: AC
  Administered 2023-08-11: 15 mmol via INTRAVENOUS
  Filled 2023-08-11: qty 5

## 2023-08-11 MED ORDER — BACITRACIN ZINC 500 UNIT/GM EX OINT: TOPICAL_OINTMENT | CUTANEOUS | Status: DC | PRN

## 2023-08-11 MED ORDER — PERFLUTREN LIPID MICROSPHERE
1.0000 mL | INTRAVENOUS | Status: AC | PRN
  Administered 2023-08-11: 2 mL via INTRAVENOUS

## 2023-08-11 NOTE — Progress Notes (Signed)
 Progress Note  1 Day Post-Op  Subjective: Denies any pain.  Eating well with no issues.  Events from yesterday noted.  Objective: Vital signs in last 24 hours: Temp:  [97.6 F (36.4 C)-98.3 F (36.8 C)] 97.9 F (36.6 C) (05/01 0818) Pulse Rate:  [0-96] 73 (05/01 0818) Resp:  [13-25] 14 (05/01 0818) BP: (67-139)/(46-88) 121/70 (05/01 0818) SpO2:  [87 %-100 %] 98 % (05/01 0818) Last BM Date : 08/09/23  Intake/Output from previous day: 04/30 0701 - 05/01 0700 In: 694.3 [I.V.:202.9; IV Piggyback:491.4] Out: 1650 [Urine:1650] Intake/Output this shift: No intake/output data recorded.  PE: General: NAD Currently getting an echo   Lab Results:  Recent Labs    08/10/23 0502 08/11/23 0431  WBC 6.0 6.8  HGB 10.2* 9.5*  HCT 30.9* 28.8*  PLT 236 245   BMET Recent Labs    08/10/23 1153 08/11/23 0431  NA 138 139  K 3.4* 3.1*  CL 106 105  CO2 21* 24  GLUCOSE 301* 160*  BUN 10 7*  CREATININE 0.82 0.74  CALCIUM  8.5* 8.3*   PT/INR No results for input(s): "LABPROT", "INR" in the last 72 hours. CMP     Component Value Date/Time   NA 139 08/11/2023 0431   K 3.1 (L) 08/11/2023 0431   CL 105 08/11/2023 0431   CO2 24 08/11/2023 0431   GLUCOSE 160 (H) 08/11/2023 0431   BUN 7 (L) 08/11/2023 0431   CREATININE 0.74 08/11/2023 0431   CREATININE 1.02 12/12/2019 0927   CALCIUM  8.3 (L) 08/11/2023 0431   PROT 4.9 (L) 08/11/2023 0431   ALBUMIN 2.3 (L) 08/11/2023 0431   AST 20 08/11/2023 0431   ALT 14 08/11/2023 0431   ALKPHOS 44 08/11/2023 0431   BILITOT 1.2 08/11/2023 0431   GFRNONAA >60 08/11/2023 0431   GFRAA >60 10/27/2019 0445   Lipase     Component Value Date/Time   LIPASE 35 04/16/2023 1448       Studies/Results: CARDIAC CATHETERIZATION Addendum Date: 08/10/2023   3rd Mrg lesion is 70% stenosed.   Prox RCA lesion is 95% stenosed.   1st LPL lesion is 30% stenosed.   LPDA lesion is 80% stenosed.   Mid LAD-1 lesion is 95% stenosed.   Mid LAD-2 lesion is  50% stenosed.   Dist LAD lesion is 40% stenosed.   Prox LAD to Mid LAD lesion is 40% stenosed.   Lat 1st Diag lesion is 80% stenosed.   A drug-eluting stent was successfully placed using a STENT SYNERGY XD 2.50X28.   Post intervention, there is a 0% residual stenosis.   Post intervention, there is a 0% residual stenosis.   LV end diastolic pressure is mildly elevated. 1.  Left dominant coronary arteries with significant three-vessel coronary artery disease.  The culprit is severe stenosis in the mid LAD.  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 2.  Left ventricular angiography was not diagnostic.  Will obtain an echocardiogram.  LVEDP was mildly to moderately elevated at 20 mmHg. 3.  Successful angioplasty and drug-eluting stent placement to the mid LAD. Recommendations: Will treat with aspirin  and clopidogrel  for now.  Heparin  can be resumed 2 hours after TR band removal given that he is on anticoagulation for pulmonary embolism.  Aspirin  can be stopped once the patient is started on a DOAC.  Clopidogrel  should be continued for at least 6 months. The rest of small vessel disease should be managed medically. Unfortunately,  his cholecystectomy will need to be postponed.  Result Date: 08/10/2023   3rd Mrg lesion is 70% stenosed.   Prox RCA lesion is 95% stenosed.   1st LPL lesion is 30% stenosed.   LPDA lesion is 80% stenosed.   Mid LAD-1 lesion is 95% stenosed.   Mid LAD-2 lesion is 50% stenosed.   Dist LAD lesion is 40% stenosed.   Prox LAD to Mid LAD lesion is 40% stenosed.   Lat 1st Diag lesion is 80% stenosed.   A drug-eluting stent was successfully placed using a STENT SYNERGY XD 2.50X28.   Post intervention, there is a 0% residual stenosis.   Post intervention, there is a 0% residual stenosis.   LV end diastolic pressure is mildly elevated. 1.  Left dominant coronary arteries with significant three-vessel coronary artery  disease.  The culprit is severe stenosis in the mid LAD.  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 2.  Left ventricular angiography was not diagnostic.  Will obtain an echocardiogram.  LVEDP was mildly to moderately elevated at 20 mmHg. 3.  Successful angioplasty and drug-eluting stent placement to the mid LAD. Recommendations: Will treat with aspirin  and clopidogrel  for now.  Heparin  can be resumed 2 hours after TR band removal given that he is on anticoagulation for pulmonary embolism.  Aspirin  can be stopped once the patient is started on a DOAC.  Clopidogrel  should be continued for at least 6 months. The rest of small vessel disease should be managed medically.   CT HEAD WO CONTRAST ( ) Result Date: 08/10/2023 CLINICAL DATA:  Altered mental status, fell 1 week ago. EXAM: CT HEAD WITHOUT CONTRAST TECHNIQUE: Contiguous axial images were obtained from the base of the skull through the vertex without intravenous contrast. RADIATION DOSE REDUCTION: This exam was performed according to the departmental dose-optimization program which includes automated exposure control, adjustment of the mA and/or kV according to patient size and/or use of iterative reconstruction technique. COMPARISON:  08/06/2023 FINDINGS: Brain: No change or acute finding. Chronic small-vessel ischemic changes affect pons. 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 Chronic small-vessel ischemic changes of the white matter. No sign of acute infarction, mass lesion, hemorrhage, hydrocephalus or extra-axial collection. Vascular: There is atherosclerotic calcification of the major vessels at the base of the brain. Skull: Negative Sinuses/Orbits: Clear/normal Other: None IMPRESSION: No acute finding by CT. Chronic small-vessel ischemic changes of the pons, thalami and cerebral hemispheric white matter. Electronically Signed   By: Bettylou Brunner M.D.   On: 08/10/2023  13:17    Anti-infectives: Anti-infectives (From admission, onward)    Start     Dose/Rate Route Frequency Ordered Stop   08/10/23 0600  ceFAZolin  (ANCEF ) IVPB 2g/100 mL premix        2 g 200 mL/hr over 30 Minutes Intravenous Every 8 hours 08/09/23 1440     08/07/23 1000  cefTRIAXone  (ROCEPHIN ) 2 g in sodium chloride  0.9 % 100 mL IVPB  Status:  Discontinued        2 g 200 mL/hr over 30 Minutes Intravenous Every 24 hours 08/07/23 0805 08/09/23 1440   08/07/23 0200  ceFEPIme  (MAXIPIME ) 2 g in sodium chloride  0.9 % 100 mL IVPB  Status:  Discontinued        2 g 200 mL/hr over 30 Minutes Intravenous Every 8 hours 08/06/23 2125 08/07/23 0805   08/06/23 1715  ceFEPIme  (MAXIPIME ) 2 g in sodium chloride  0.9 % 100 mL IVPB  2 g 200 mL/hr over 30 Minutes Intravenous  Once 08/06/23 1713 08/06/23 1846        Assessment/Plan HIDA finding of biliary dyskinesia with EF of 4% - AF, HDS, WBC normal - patient has no clinical symptoms of biliary dyskinesia -given events of yesterday, surgery will be indefinitely put on hold.  D/w patient -he may follow up in 6 months in our office if he would like to discuss elective lap chole moving forward. -surgery will sign off at this time     FEN: HH ID: ancef  (bacteremia) VTE: per cardiology  NSTEMI - s/p PCI  I reviewed Consultant cardiology notes, hospitalist notes, last 24 h vitals and pain scores, last 48 h intake and output, last 24 h labs and trends, and last 24 h imaging results.     LOS: 5 days   Leone Ralphs, [facility] 08/11/2023, 10:20 AM Please see Amion for pager number during day hours 7:00am-4:30pm

## 2023-08-11 NOTE — Progress Notes (Signed)
 Mobility Specialist Progress Note;   08/11/23 1038  Mobility  Activity Refused mobility  Mobility Specialist Start Time (ACUTE ONLY) 1038  Mobility Specialist Stop Time (ACUTE ONLY) 1047  Mobility Specialist Time Calculation (min) (ACUTE ONLY) 9 min   Pt refusing any mobility at this time d/t receiving news earlier this AM. Pt left in room with all needs met. RN in room.   Janit Meline Mobility Specialist Please contact via SecureChat or [employer] [phone]

## 2023-08-11 NOTE — Progress Notes (Signed)
 CARDIAC REHAB PHASE I     Post MI/stent education including restrictions, risk factors, exercise guidelines, antiplatelet therapy importance, MI booklet, NTG use, heart healthy diabetic diet and CRP2 reviewed. All questions and concerns addressed. Will refer to [facility] for CRP2, if and when pt ready to participate. Pt has complex medical issues at present, pt hopes to slowly recover and participate with CRP2 in the future. PT/OT and mobility team to address complex mobility needs. CRP1 will sign off today.   [id number] Ronny Colas, RN BSN 08/11/2023 11:28 AM

## 2023-08-11 NOTE — Plan of Care (Signed)
  Problem: Fluid Volume: Goal: Hemodynamic stability will improve Outcome: Progressing   Problem: Clinical Measurements: Goal: Diagnostic test results will improve Outcome: Progressing Goal: Signs and symptoms of infection will decrease Outcome: Progressing   Problem: Respiratory: Goal: Ability to maintain adequate ventilation will improve Outcome: Progressing   Problem: Education: Goal: Ability to describe self-care measures that may prevent or decrease complications (Diabetes Survival Skills Education) will improve Outcome: Progressing Goal: Individualized Educational Video(s) Outcome: Progressing   Problem: Coping: Goal: Ability to adjust to condition or change in health will improve Outcome: Progressing   Problem: Fluid Volume: Goal: Ability to maintain a balanced intake and output will improve Outcome: Progressing   Problem: Health Behavior/Discharge Planning: Goal: Ability to identify and utilize available resources and services will improve Outcome: Progressing Goal: Ability to manage health-related needs will improve Outcome: Progressing   Problem: Metabolic: Goal: Ability to maintain appropriate glucose levels will improve Outcome: Progressing   Problem: Nutritional: Goal: Maintenance of adequate nutrition will improve Outcome: Progressing Goal: Progress toward achieving an optimal weight will improve Outcome: Progressing   Problem: Skin Integrity: Goal: Risk for impaired skin integrity will decrease Outcome: Progressing   Problem: Tissue Perfusion: Goal: Adequacy of tissue perfusion will improve Outcome: Progressing   Problem: Education: Goal: Knowledge of General Education information will improve Description: Including pain rating scale, medication(s)/side effects and non-pharmacologic comfort measures Outcome: Progressing   Problem: Health Behavior/Discharge Planning: Goal: Ability to manage health-related needs will improve Outcome:  Progressing   Problem: Clinical Measurements: Goal: Ability to maintain clinical measurements within normal limits will improve Outcome: Progressing Goal: Will remain free from infection Outcome: Progressing Goal: Diagnostic test results will improve Outcome: Progressing Goal: Respiratory complications will improve Outcome: Progressing Goal: Cardiovascular complication will be avoided Outcome: Progressing   Problem: Activity: Goal: Risk for activity intolerance will decrease Outcome: Progressing   Problem: Nutrition: Goal: Adequate nutrition will be maintained Outcome: Progressing   Problem: Coping: Goal: Level of anxiety will decrease Outcome: Progressing   Problem: Elimination: Goal: Will not experience complications related to bowel motility Outcome: Progressing Goal: Will not experience complications related to urinary retention Outcome: Progressing   Problem: Pain Managment: Goal: General experience of comfort will improve and/or be controlled Outcome: Progressing   Problem: Safety: Goal: Ability to remain free from injury will improve Outcome: Progressing   Problem: Skin Integrity: Goal: Risk for impaired skin integrity will decrease Outcome: Progressing   Problem: Education: Goal: Understanding of CV disease, CV risk reduction, and recovery process will improve Outcome: Progressing Goal: Individualized Educational Video(s) Outcome: Progressing   Problem: Activity: Goal: Ability to return to baseline activity level will improve Outcome: Progressing   Problem: Cardiovascular: Goal: Ability to achieve and maintain adequate cardiovascular perfusion will improve Outcome: Progressing Goal: Vascular access site(s) Level 0-1 will be maintained Outcome: Progressing   Problem: Health Behavior/Discharge Planning: Goal: Ability to safely manage health-related needs after discharge will improve Outcome: Progressing

## 2023-08-11 NOTE — Progress Notes (Signed)
 Inpatient Rehabilitation Admissions Coordinator   I am unable to proceed with Auth for possible Cir admit, for patient has not demonstrated yet the ability to tolerate CIR level rehab. On 4/30 had episode after working with mobility specialist that led to further cardiology workup. I await further progress to assist with planning rehab dispo options.  Jeannetta Millman, RN, MSN Rehab Admissions Coordinator [phone] 08/11/2023 3:28 PM

## 2023-08-11 NOTE — Progress Notes (Signed)
  Echocardiogram 2D Echocardiogram has been performed.  Annis Kinder, RDCS 08/11/2023, 10:28 AM

## 2023-08-11 NOTE — Progress Notes (Signed)
 PROGRESS NOTE    John [name]  WUJ:[id number] DOB: 1946-02-09 DOA: 08/06/2023 PCP: Jobe Mulder, DO   Brief Narrative: 78 year old with past medical history significant for BPH status post Foley placement, depression, hypertension, diabetes type 2, hyperlipidemia, neuropathy who presented with worsening weakness, lethargy and recurrent falls at home.  He recently had Foley catheter placement by urology on 07/13/2023 in the setting of BPH and outflow obstruction.  Due to persistent weakness he presented for further evaluation.  Urinalysis was concerning for signs of infection.  Blood cultures were drawn and he was admitted for further workup.  Found to have E. coli bacteremia, sepsis secondary to UTI, imaging concerning for cholelithiasis, HIDA scan concerning for chronic cholecystitis or biliary akinesis. Had Hypotensive episode, associated with diaphoresis, pale. EKG showed T wave changes, elevated troponin, had Cath found to have MI, had stent placement. Cholecystectomy will be postpone out patient in 6 months. Surgery doesn't think patient has cholecystitis.   Assessment & Plan:   Principal Problem:   Severe sepsis (HCC) Active Problems:   E coli bacteremia   Acute urinary retention   Cholelithiasis   Insulin  dependent type 2 diabetes mellitus (HCC)   Fall at home, initial encounter   VTE (venous thromboembolism)   BPH (benign prostatic hyperplasia)   Heart block AV complete (HCC)   Peripheral neuropathy   GERD (gastroesophageal reflux disease)   Hyperlipidemia   Hypomagnesemia   Essential hypertension   Chronic diastolic CHF (congestive heart failure) (HCC)   Sacral pressure ulcer   B12 deficiency   Iron deficiency anemia   Non-ST elevation (NSTEMI) myocardial infarction (HCC)  1-Severe sepsis in the setting of  UTI and E. coli bacteremia - Patient presented with tachycardia, tachypnea, leukocytosis, lactic acidosis.  Source suspected UTI  - Patient received IV  fluids.  IV antibiotics. - Patient had recent Foley catheter change by [facility]/10.  Evaluated by urology on 4/27 who does not recommend Foley exchange at this time. Urine culture growing Klebsiella and E. Coli -Continue with IV Anceff, day 4. Repeated blood culture no growth to date. He will need 10-14 days tx  E. coli bacteremia: - Presumed translocation from urinary source versus possible GI source - Continue with IV Anceff Repeated blood culture 4/30 no growth to date.   MI Hypotension,  Near syncope Patient became hypotensive  cold, clammy, pale, drowsy disoriented IV bolus liter fluid ordered EKG some EKG changes T wave inversion Troponin ordered and elevated cardiology has been consulted CT head negative.  Cardiology recommend Cath, he underwent cath 4/30 which showed Left dominant coronary arteries with significant three-vessel coronary artery disease. The culprit is severe stenosis in the mid LAD. 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 Successful angioplasty and drug-eluting stent placement to the mid LAD  -Started on Plavix  and aspirin . Ok to discontinue aspirin  after eliquis  is resume per cardiology    Cholelithiasis: -Patient has had persistent vomiting postprandial especially with fatty meal over the last month - Right upper quadrant ultrasound cholelithiasis with gallbladder sludge. - Discussion 1/28 show ejection fraction 4% for chronic cholecystitis/biliary dyskinesia  - Surgery consulted plan was initially for cholecystectomy, due to events from 4/30, patient had MI, plan is for elective cholecystectomy in 6 months. Surgery doesn't think patient has cholecystitis.    BPH acute urinary retention Has chronic Foley catheter He is leaking around the catheter, urology consulted. Nurse to do flushes to catheter.   VTE,  diagnosed DVT left femoral vein SF pain 1/25 -History of PE  right lower lobe 1/25 He has been compliant with Eliquis  Resume eliquis  no procedure planned./   Fall at home Needs PT>    Diabetes type 2 -SSI.   Heart block completed status post pacemaker - Pacemaker placed 05/12/2023   Iron deficiency anemia - Started on replacement  B12 deficiency - He was started on B12 replacement  Sacral pressure ulcer - See wound care documentation below  Chronic diastolic heart failure - Recent echo 04/2023 ejection fraction 65%. -.  IV fluids  Essential hypertension - Not on blood pressure medication at home and currently blood pressure soft  Hypomagnesemia Replace IV magnesium   Hyperlipidemia Continue with Lipitor   GERD PPI  Hypokalemia: Replete IV Hypophosphatemia; replaced.   Wound care documentation below Pressure Injury 05/11/23 Sacrum Right;Left;Mid Stage 1 -  Intact skin with non-blanchable redness of a localized area usually over a bony prominence. (Active)  05/11/23 1600  Location: Sacrum  Location Orientation: Right;Left;Mid  Staging: Stage 1 -  Intact skin with non-blanchable redness of a localized area usually over a bony prominence.  Wound Description (Comments):   Present on Admission:      Pressure Injury 08/06/23 Sacrum Medial Stage 1 -  Intact skin with non-blanchable redness of a localized area usually over a bony prominence. (Active)  08/06/23 2250  Location: Sacrum  Location Orientation: Medial  Staging: Stage 1 -  Intact skin with non-blanchable redness of a localized area usually over a bony prominence.  Wound Description (Comments):   Present on Admission: Yes  Dressing Type Foam - Lift dressing to assess site every shift 08/10/23 2000     Nutrition Problem: Severe Malnutrition Etiology: chronic illness    Signs/Symptoms: mild fat depletion, severe muscle depletion, percent weight loss (within the last 6 months he has lost 50 lbs, 20% weight loss.) Percent weight loss: 20 %    Interventions:  Refer to RD note for recommendations  Estimated body mass index is 27.42 kg/m as calculated from the following:   Height as of this encounter: 6' (1.829 m).   Weight as of this encounter: 91.7 kg.   DVT prophylaxis: heparin  gtt Code Status: DNR Family Communication: wife over phone  4/30  5/01 Disposition Plan:  Status is: Inpatient Remains inpatient appropriate because: management of sepsis.     Consultants:  General Sx Cardiology   Procedures:    Antimicrobials:    Subjective: Patient is aggravated today. We talk about he doesn't need cholecystectomy right now, we have to focus on getting his heart stable.  Denies chest pain. Complaint of pain in his penis in the catheter.  Objective: Vitals:   08/10/23 2000 08/10/23 2100 08/10/23 2200 08/11/23 0432  BP:    129/70  Pulse: 77 81 92 75  Resp: (!) 22 20 20 16   Temp:    98.3 F (36.8 C)  TempSrc:    Oral  SpO2: 99% 96% 99% 99%  Weight:      Height:        Intake/Output Summary (Last 24 hours) at 08/11/2023 0719 Last data filed at 08/11/2023 0442 Gross per 24 hour  Intake 694.28 ml  Output 1650 ml  Net -955.72 ml   Filed Weights   08/06/23 2250 08/08/23 0400  Weight: 90 kg 91.7 kg    Examination:  General exam: NAD Respiratory system: CTA Cardiovascular system: S 1, S 2 RRR Gastrointestinal system: BS present, soft, nt Central nervous system:Alert, follows command Extremities:  no edema   Data Reviewed: I have personally reviewed following labs and imaging studies  CBC: Recent Labs  Lab 08/06/23 1713 08/06/23 1738 08/07/23 0208 08/08/23 0822 08/09/23 0503 08/10/23 0502 08/11/23 0431  WBC 9.6  --  13.3* 7.8 6.7 6.0 6.8  NEUTROABS 8.5*  --   --  6.2 5.0 3.8 3.9  HGB 11.8*   < > 9.7* 9.3* 10.1* 10.2* 9.5*  HCT 37.1*   < > 29.8* 28.8* 30.1* 30.9* 28.8*  MCV 88.3  --  85.9 86.7 84.3 84.0 85.0  PLT 252  --  233 239 240 236 245   < > = values in this interval not displayed.   Basic Metabolic  Panel: Recent Labs  Lab 08/06/23 2344 08/07/23 0208 08/08/23 0531 08/09/23 0503 08/10/23 0502 08/10/23 1153 08/11/23 0431  NA  --  137 139 138 135 138 139  K  --  3.7 3.6 3.2* 3.0* 3.4* 3.1*  CL  --  101 104 103 102 106 105  CO2  --  19* 20* 22 25 21* 24  GLUCOSE  --  186* 148* 140* 280* 301* 160*  BUN  --  12 16 13 10 10  7*  CREATININE  --  1.02 1.05 0.87 0.82 0.82 0.74  CALCIUM   --  8.2* 8.5* 8.2* 8.3* 8.5* 8.3*  MG 1.0* 2.2 1.7 1.7 1.8  --  2.0  PHOS 4.2 4.4  --  2.0* 2.5  --  2.2*   GFR: Estimated Creatinine Clearance: 84.9 mL/min (by C-G formula based on SCr of 0.74 mg/dL). Liver Function Tests: Recent Labs  Lab 08/07/23 0208 08/08/23 1452 08/09/23 0503 08/10/23 0502 08/11/23 0431  AST 24 35 31 27 20   ALT 16 16 16 18 14   ALKPHOS 49 50 46 52 44  BILITOT 1.8* 1.2 1.7* 1.2 1.2  PROT 5.4* 5.8* 5.1* 5.0* 4.9*  ALBUMIN 2.6* 2.6* 2.3* 2.3* 2.3*   No results for input(s): "LIPASE", "AMYLASE" in the last 168 hours. Recent Labs  Lab 08/06/23 2344  AMMONIA 20   Coagulation Profile: Recent Labs  Lab 08/06/23 2344  INR 1.4*   Cardiac Enzymes: Recent Labs  Lab 08/06/23 2344  CKTOTAL 111   BNP (last 3 results) No results for input(s): "PROBNP" in the last 8760 hours. HbA1C: No results for input(s): "HGBA1C" in the last 72 hours. CBG: Recent Labs  Lab 08/09/23 2046 08/10/23 0732 08/10/23 1056 08/10/23 1718 08/10/23 2130  GLUCAP 245* 231* 324* 241* 251*   Lipid Profile: No results for input(s): "CHOL", "HDL", "LDLCALC", "TRIG", "CHOLHDL", "LDLDIRECT" in the last 72 hours. Thyroid  Function Tests: No results for input(s): "TSH", "T4TOTAL", "FREET4", "T3FREE", "THYROIDAB" in the last 72 hours. Anemia Panel: No results for input(s): "VITAMINB12", "FOLATE", "FERRITIN", "TIBC", "IRON", "RETICCTPCT" in the last 72 hours. Sepsis Labs: Recent Labs  Lab 08/06/23 2344 08/07/23 0208 08/10/23 1153 08/10/23 1411  PROCALCITON 9.53  --   --   --   LATICACIDVEN  2.1* 1.3 2.1* 1.8    Recent Results (from the past 240 hours)  Blood Culture (routine x 2)     Status: Abnormal (Preliminary result)   Collection Time: 08/06/23  5:13 PM   Specimen: BLOOD  Result Value Ref Range Status   Specimen Description BLOOD RIGHT ANTECUBITAL  Final   Special Requests   Final    BOTTLES DRAWN AEROBIC ONLY Blood Culture results may not be optimal due to an inadequate volume of blood received in culture bottles   Culture  Setup Time  Final    GRAM NEGATIVE RODS AEROBIC BOTTLE ONLY CRITICAL VALUE NOTED.  VALUE IS CONSISTENT WITH PREVIOUSLY REPORTED AND CALLED VALUE. Performed at [facility], [address] [place], [place] [address]    Culture ESCHERICHIA COLI (A)  Final   Report Status PENDING  Incomplete  Urine Culture     Status: Abnormal   Collection Time: 08/06/23  5:13 PM   Specimen: Urine, Random  Result Value Ref Range Status   Specimen Description URINE, RANDOM  Final   Special Requests   Final    URINE, CATHETERIZED Performed at [facility], [address] [place], [place] [address]    Culture (A)  Final    >=100,000 COLONIES/mL KLEBSIELLA PNEUMONIAE >=100,000 COLONIES/mL ESCHERICHIA COLI    Report Status 08/09/2023 FINAL  Final   Organism ID, Bacteria KLEBSIELLA PNEUMONIAE (A)  Final   Organism ID, Bacteria ESCHERICHIA COLI (A)  Final      Susceptibility   Escherichia coli - MIC*    AMPICILLIN 4 SENSITIVE Sensitive     CEFAZOLIN  <=4 SENSITIVE Sensitive     CEFEPIME  <=0.12 SENSITIVE Sensitive     CEFTRIAXONE  <=0.25 SENSITIVE Sensitive     CIPROFLOXACIN <=0.25 SENSITIVE Sensitive     GENTAMICIN  <=1 SENSITIVE Sensitive     IMIPENEM <=0.25 SENSITIVE Sensitive     NITROFURANTOIN <=16 SENSITIVE Sensitive     TRIMETH/SULFA <=20 SENSITIVE Sensitive     AMPICILLIN/SULBACTAM <=2 SENSITIVE Sensitive     PIP/TAZO <=4 SENSITIVE Sensitive ug/mL    * >=100,000 COLONIES/mL ESCHERICHIA COLI   Klebsiella pneumoniae - MIC*    AMPICILLIN  >=32 RESISTANT Resistant     CEFAZOLIN  <=4 SENSITIVE Sensitive     CEFEPIME  <=0.12 SENSITIVE Sensitive     CEFTRIAXONE  <=0.25 SENSITIVE Sensitive     CIPROFLOXACIN <=0.25 SENSITIVE Sensitive     GENTAMICIN  <=1 SENSITIVE Sensitive     IMIPENEM <=0.25 SENSITIVE Sensitive     NITROFURANTOIN 64 INTERMEDIATE Intermediate     TRIMETH/SULFA <=20 SENSITIVE Sensitive     AMPICILLIN/SULBACTAM 4 SENSITIVE Sensitive     PIP/TAZO <=4 SENSITIVE Sensitive ug/mL    * >=100,000 COLONIES/mL KLEBSIELLA PNEUMONIAE  Blood Culture (routine x 2)     Status: Abnormal   Collection Time: 08/06/23  5:26 PM   Specimen: BLOOD RIGHT HAND  Result Value Ref Range Status   Specimen Description BLOOD RIGHT HAND  Final   Special Requests   Final    BOTTLES DRAWN AEROBIC AND ANAEROBIC Blood Culture results may not be optimal due to an inadequate volume of blood received in culture bottles   Culture  Setup Time   Final    GRAM NEGATIVE RODS ANAEROBIC BOTTLE ONLY CRITICAL RESULT CALLED TO, READ BACK BY AND VERIFIED WITH: JWYLAND,PHARMD@[email or web address]  08/07/23 MK Performed at [facility], [address], [place], [place] [address]    Culture ESCHERICHIA COLI (A)  Final   Report Status 08/09/2023 FINAL  Final   Organism ID, Bacteria ESCHERICHIA COLI  Final   Organism ID, Bacteria ESCHERICHIA COLI  Final      Susceptibility   Escherichia coli - KIRBY BAUER*    CEFAZOLIN  SENSITIVE Sensitive    Escherichia coli - MIC*    AMPICILLIN 4 SENSITIVE Sensitive     CEFEPIME  <=0.12 SENSITIVE Sensitive     CEFTAZIDIME <=1 SENSITIVE Sensitive     CEFTRIAXONE  <=0.25 SENSITIVE Sensitive     CIPROFLOXACIN <=0.25 SENSITIVE Sensitive     GENTAMICIN  <=1 SENSITIVE Sensitive  IMIPENEM <=0.25 SENSITIVE Sensitive     TRIMETH/SULFA <=20 SENSITIVE Sensitive     AMPICILLIN/SULBACTAM <=2 SENSITIVE Sensitive     PIP/TAZO <=4 SENSITIVE Sensitive ug/mL    * ESCHERICHIA COLI    ESCHERICHIA COLI  Blood Culture ID Panel (Reflexed)     Status:  Abnormal   Collection Time: 08/06/23  5:26 PM  Result Value Ref Range Status   Enterococcus faecalis NOT DETECTED NOT DETECTED Final   Enterococcus Faecium NOT DETECTED NOT DETECTED Final   Listeria monocytogenes NOT DETECTED NOT DETECTED Final   Staphylococcus species NOT DETECTED NOT DETECTED Final   Staphylococcus aureus (BCID) NOT DETECTED NOT DETECTED Final   Staphylococcus epidermidis NOT DETECTED NOT DETECTED Final   Staphylococcus lugdunensis NOT DETECTED NOT DETECTED Final   Streptococcus species NOT DETECTED NOT DETECTED Final   Streptococcus agalactiae NOT DETECTED NOT DETECTED Final   Streptococcus pneumoniae NOT DETECTED NOT DETECTED Final   Streptococcus pyogenes NOT DETECTED NOT DETECTED Final   A.calcoaceticus-baumannii NOT DETECTED NOT DETECTED Final   Bacteroides fragilis NOT DETECTED NOT DETECTED Final   Enterobacterales DETECTED (A) NOT DETECTED Final    Comment: Enterobacterales represent a large order of gram negative bacteria, not a single organism. CRITICAL RESULT CALLED TO, READ BACK BY AND VERIFIED WITH: J WYLAND,PHARMD@[email or web address]  08/07/23 MK    Enterobacter cloacae complex NOT DETECTED NOT DETECTED Final   Escherichia coli DETECTED (A) NOT DETECTED Final    Comment: CRITICAL RESULT CALLED TO, READ BACK BY AND VERIFIED WITH: J WYLAND,PHARMD@[email or web address]  08/07/23 MK    Klebsiella aerogenes NOT DETECTED NOT DETECTED Final   Klebsiella oxytoca NOT DETECTED NOT DETECTED Final   Klebsiella pneumoniae NOT DETECTED NOT DETECTED Final   Proteus species NOT DETECTED NOT DETECTED Final   Salmonella species NOT DETECTED NOT DETECTED Final   Serratia marcescens NOT DETECTED NOT DETECTED Final   Haemophilus influenzae NOT DETECTED NOT DETECTED Final   Neisseria meningitidis NOT DETECTED NOT DETECTED Final   Pseudomonas aeruginosa NOT DETECTED NOT DETECTED Final   Stenotrophomonas maltophilia NOT DETECTED NOT DETECTED Final   Candida albicans NOT DETECTED NOT DETECTED Final    Candida auris NOT DETECTED NOT DETECTED Final   Candida glabrata NOT DETECTED NOT DETECTED Final   Candida krusei NOT DETECTED NOT DETECTED Final   Candida parapsilosis NOT DETECTED NOT DETECTED Final   Candida tropicalis NOT DETECTED NOT DETECTED Final   Cryptococcus neoformans/gattii NOT DETECTED NOT DETECTED Final   CTX-M ESBL NOT DETECTED NOT DETECTED Final   Carbapenem resistance IMP NOT DETECTED NOT DETECTED Final   Carbapenem resistance KPC NOT DETECTED NOT DETECTED Final   Carbapenem resistance NDM NOT DETECTED NOT DETECTED Final   Carbapenem resist OXA 48 LIKE NOT DETECTED NOT DETECTED Final   Carbapenem resistance VIM NOT DETECTED NOT DETECTED Final    Comment: Performed at [facility], [address] [place], [place] [address]  Resp panel by RT-PCR (RSV, Flu A&B, Covid) Anterior Nasal Swab     Status: None   Collection Time: 08/06/23  5:53 PM   Specimen: Anterior Nasal Swab  Result Value Ref Range Status   SARS Coronavirus 2 by RT PCR NEGATIVE NEGATIVE Final   Influenza A by PCR NEGATIVE NEGATIVE Final   Influenza B by PCR NEGATIVE NEGATIVE Final    Comment: (NOTE) The Xpert Xpress SARS-CoV-2/FLU/RSV plus assay is intended as an aid in the diagnosis of influenza from Nasopharyngeal swab specimens and should not be used as a sole basis for treatment. Nasal washings and  aspirates are unacceptable for Xpert Xpress SARS-CoV-2/FLU/RSV testing.  Fact Sheet for Patients: BloggerCourse.com  Fact Sheet for Healthcare Providers: SeriousBroker.it  This test is not yet approved or cleared by the [place]  FDA and has been authorized for detection and/or diagnosis of SARS-CoV-2 by FDA under an Emergency Use Authorization (EUA). This EUA will remain in effect (meaning this test can be used) for the duration of the COVID-19 declaration under Section 564(b)(1) of the Act, 21 U.S.C. section 360bbb-3(b)(1), unless the  authorization is terminated or revoked.     Resp Syncytial Virus by PCR NEGATIVE NEGATIVE Final    Comment: (NOTE) Fact Sheet for Patients: BloggerCourse.com  Fact Sheet for Healthcare Providers: SeriousBroker.it  This test is not yet approved or cleared by the [place]  FDA and has been authorized for detection and/or diagnosis of SARS-CoV-2 by FDA under an Emergency Use Authorization (EUA). This EUA will remain in effect (meaning this test can be used) for the duration of the COVID-19 declaration under Section 564(b)(1) of the Act, 21 U.S.C. section 360bbb-3(b)(1), unless the authorization is terminated or revoked.  Performed at [facility], [address], [place], [place] [address]   Culture, blood (Routine X 2) w Reflex to ID Panel     Status: None (Preliminary result)   Collection Time: 08/10/23 11:53 AM   Specimen: BLOOD RIGHT ARM  Result Value Ref Range Status   Specimen Description BLOOD RIGHT ARM  Final   Special Requests   Final    BOTTLES DRAWN AEROBIC ONLY Blood Culture results may not be optimal due to an inadequate volume of blood received in culture bottles   Culture   Final    NO GROWTH < 24 HOURS Performed at [facility], [address], [place], [place] [address]    Report Status PENDING  Incomplete  Culture, blood (Routine X 2) w Reflex to ID Panel     Status: None (Preliminary result)   Collection Time: 08/10/23 11:53 AM   Specimen: BLOOD RIGHT HAND  Result Value Ref Range Status   Specimen Description BLOOD RIGHT HAND  Final   Special Requests   Final    BOTTLES DRAWN AEROBIC ONLY Blood Culture results may not be optimal due to an inadequate volume of blood received in culture bottles   Culture   Final    NO GROWTH < 24 HOURS Performed at [facility], [address] [place], [place] [address]    Report Status PENDING  Incomplete         Radiology Studies: CARDIAC  CATHETERIZATION Addendum Date: 08/10/2023   3rd Mrg lesion is 70% stenosed.   Prox RCA lesion is 95% stenosed.   1st LPL lesion is 30% stenosed.   LPDA lesion is 80% stenosed.   Mid LAD-1 lesion is 95% stenosed.   Mid LAD-2 lesion is 50% stenosed.   Dist LAD lesion is 40% stenosed.   Prox LAD to Mid LAD lesion is 40% stenosed.   Lat 1st Diag lesion is 80% stenosed.   A drug-eluting stent was successfully placed using a STENT SYNERGY XD 2.50X28.   Post intervention, there is a 0% residual stenosis.   Post intervention, there is a 0% residual stenosis.   LV end diastolic pressure is mildly elevated. 1.  Left dominant coronary arteries with significant three-vessel coronary artery disease.  The culprit is severe stenosis in the mid LAD.  There is extensive small vessel coronary artery disease and significant stenosis in the  nondominant right coronary artery. 2.  Left ventricular angiography was not diagnostic.  Will obtain an echocardiogram.  LVEDP was mildly to moderately elevated at 20 mmHg. 3.  Successful angioplasty and drug-eluting stent placement to the mid LAD. Recommendations: Will treat with aspirin  and clopidogrel  for now.  Heparin  can be resumed 2 hours after TR band removal given that he is on anticoagulation for pulmonary embolism.  Aspirin  can be stopped once the patient is started on a DOAC.  Clopidogrel  should be continued for at least 6 months. The rest of small vessel disease should be managed medically. Unfortunately, his cholecystectomy will need to be postponed.  Result Date: 08/10/2023   3rd Mrg lesion is 70% stenosed.   Prox RCA lesion is 95% stenosed.   1st LPL lesion is 30% stenosed.   LPDA lesion is 80% stenosed.   Mid LAD-1 lesion is 95% stenosed.   Mid LAD-2 lesion is 50% stenosed.   Dist LAD lesion is 40% stenosed.   Prox LAD to Mid LAD lesion is 40% stenosed.   Lat 1st Diag lesion is 80% stenosed.   A drug-eluting stent was successfully placed using a STENT SYNERGY XD 2.50X28.   Post  intervention, there is a 0% residual stenosis.   Post intervention, there is a 0% residual stenosis.   LV end diastolic pressure is mildly elevated. 1.  Left dominant coronary arteries with significant three-vessel coronary artery disease.  The culprit is severe stenosis in the mid LAD.  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 2.  Left ventricular angiography was not diagnostic.  Will obtain an echocardiogram.  LVEDP was mildly to moderately elevated at 20 mmHg. 3.  Successful angioplasty and drug-eluting stent placement to the mid LAD. Recommendations: Will treat with aspirin  and clopidogrel  for now.  Heparin  can be resumed 2 hours after TR band removal given that he is on anticoagulation for pulmonary embolism.  Aspirin  can be stopped once the patient is started on a DOAC.  Clopidogrel  should be continued for at least 6 months. The rest of small vessel disease should be managed medically.   CT HEAD WO CONTRAST ( ) Result Date: 08/10/2023 CLINICAL DATA:  Altered mental status, fell 1 week ago. EXAM: CT HEAD WITHOUT CONTRAST TECHNIQUE: Contiguous axial images were obtained from the base of the skull through the vertex without intravenous contrast. RADIATION DOSE REDUCTION: This exam was performed according to the departmental dose-optimization program which includes automated exposure control, adjustment of the mA and/or kV according to patient size and/or use of iterative reconstruction technique. COMPARISON:  08/06/2023 FINDINGS: Brain: No change or acute finding. Chronic small-vessel ischemic changes affect pons. 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 Chronic small-vessel ischemic changes of the white matter. No sign of acute infarction, mass lesion, hemorrhage, hydrocephalus or extra-axial collection. Vascular: There is atherosclerotic calcification of the major vessels at the base of the brain. Skull: Negative  Sinuses/Orbits: Clear/normal Other: None IMPRESSION: No acute finding by CT. Chronic small-vessel ischemic changes of the pons, thalami and cerebral hemispheric white matter. Electronically Signed   By: Bettylou Brunner M.D.   On: 08/10/2023 13:17        Scheduled Meds:  aspirin  EC  81 mg Oral Daily   atorvastatin   80 mg Oral QHS   Chlorhexidine  Gluconate Cloth  6 each Topical Daily   clopidogrel   75 mg Oral Q breakfast   vitamin B-12  1,000 mcg Oral Daily   feeding  supplement  1 Container Oral TID BM   ferrous sulfate   325 mg Oral Q breakfast   finasteride   5 mg Oral Daily   Gerhardt's butt cream   Topical BID   insulin  aspart  0-9 Units Subcutaneous TID AC & HS   multivitamin with minerals  1 tablet Oral Daily   nystatin    Topical TID   pantoprazole   40 mg Oral QHS   sodium chloride  flush  3 mL Intravenous Q12H   tamsulosin   0.4 mg Oral Daily   thiamine   100 mg Oral Daily   Continuous Infusions:  sodium chloride       ceFAZolin  (ANCEF ) IV 2 g (08/11/23 0537)   heparin  1,100 Units/hr (08/11/23 0328)   lactated ringers  Stopped (08/10/23 1742)     LOS: 5 days    Time spent: 35 Minutes    Edelmiro Innocent A Shauntia Levengood, MD [facility]   If 7PM-7AM, please contact night-coverage [email or web address]  08/11/2023, 7:19 AM

## 2023-08-11 NOTE — Anesthesia Preprocedure Evaluation (Signed)
 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  Reproductive/Obstetrics negative OB ROS                             Anesthesia Physical Anesthesia Plan  Anesthesia Quick Evaluation

## 2023-08-11 NOTE — Addendum Note (Signed)
 Addended by: Lott Rouleau A on: 08/11/2023 10:43 AM   Modules accepted: Orders

## 2023-08-11 NOTE — Progress Notes (Signed)
 This chaplain responded to the unit's page for spiritual care. The chaplain understands the Pt. is requesting a pastoral presence.  The chaplain sat beside the Pt. and listened reflectively to the Pt. express the mixture of emotions he is experiencing during this admission. The chaplain understands the Pt. goal is to return home.   The Pt. hopes for improved communication from the medical team. The Pt. shared he is physically "uncomfortable and waiting on gallbladder surgery because of the need for heart care."  The Pt. appreciated the visit and is interested in F/U spiritual care.  Chaplain Kathleene Papas [phone]

## 2023-08-11 NOTE — Progress Notes (Signed)
 Remote pacemaker transmission.

## 2023-08-11 NOTE — Inpatient Diabetes Management (Signed)
 Inpatient Diabetes Program Recommendations  AACE/ADA: New Consensus Statement on Inpatient Glycemic Control (2015)  Target Ranges:  Prepandial:   less than 140 mg/dL      Peak postprandial:   less than 180 mg/dL (1-2 hours)      Critically ill patients:  140 - 180 mg/dL   Lab Results  Component Value Date   GLUCAP 167 (H) 08/11/2023   HGBA1C 7.5 (H) 08/06/2023    Review of Glycemic Control  Latest Reference Range & Units 08/10/23 07:32 08/10/23 10:56 08/10/23 17:18 08/10/23 21:30 08/11/23 08:32  Glucose-Capillary 70 - 99 mg/dL 782 (H) 956 (H) 213 (H) 251 (H) 167 (H)   Diabetes history: DM 2 Outpatient Diabetes medications:  Jardiance  25 mg Daily, metformin  1000 mg bid Current orders for Inpatient glycemic control:  Novolog  0-9 units tid and hs  Boost tid between meals A1c 7.5% on 4/26  Inpatient Diabetes Program Recommendations:    -    May consider starting Novolog  2 units tid meal coverage if eating >50% of meals  Thanks, Eloise Hake RN, MSN, BC-ADM Inpatient Diabetes Coordinator Team Pager [phone] (8a-5p)

## 2023-08-11 NOTE — Progress Notes (Signed)
 Physical Therapy Treatment Patient Details Name: John [name] MRN: [id number] DOB: [birth year] Today's Date: 08/11/2023   History of Present Illness John [name] is a 78 y.o. male admitted 08/06/23 for suspected severe sepsis. Pt presented with AMS after a ground level fall at home. UA showing white and red blood cells as well as some moderate bacteria; urine cultures pending. CT abdomen showed distended gallbladder, with multiple calcified gallstones layering dependently. US  Abdomen demonstrated cholecystitis. PMH significant for T2DM, indwelling Foley catheter, GERD, BPH, HTN, HLD, s/p pacemaker, breast cancer, DVT/PE, and CVA.    PT Comments  Pt received resting in bed, pleasant and agreeable to therapy session, with continued progress toward acute goals. Pt demonstrated bed mobility with CGA for safety and increased time to come to EOB. Pt with increased activity tolerance this session, progressing gait distance with RW support and grossly CGA for safety, minA to steady when turning and with increased fatigue on return to room. BP stable throughout session, pt without c/o of dizziness/lightheadedness, HR 87-100bpm during activity. Pt declining time up in chair at end of session, requesting back to bed due to fatigue. Pt continues to benefit from skilled PT services to progress toward functional mobility goals, will continue to follow acutely.       If plan is discharge home, recommend the following: A little help with walking and/or transfers;A little help with bathing/dressing/bathroom;Assistance with cooking/housework;Assist for transportation;Help with stairs or ramp for entrance   Can travel by private vehicle        Equipment Recommendations  None recommended by PT (pt already has DME)    Recommendations for Other Services       Precautions / Restrictions Precautions Precautions: Fall Recall of Precautions/Restrictions: Intact Precaution/Restrictions Comments: watch  BP Restrictions Weight Bearing Restrictions Per Provider Order: No     Mobility  Bed Mobility Overal bed mobility: Needs Assistance Bed Mobility: Supine to Sit     Supine to sit: HOB elevated, Used rails, Contact guard     General bed mobility comments: increased time to come to EOB    Transfers Overall transfer level: Needs assistance Equipment used: Rolling walker (2 wheels) Transfers: Sit to/from Stand             General transfer comment: CGA for saftey    Ambulation/Gait Ambulation/Gait assistance: Contact guard assist, Min assist Gait Distance (Feet): 86 Feet Assistive device: Rolling walker (2 wheels) Gait Pattern/deviations: Trunk flexed, Step-through pattern, Decreased step length - right, Decreased step length - left       General Gait Details: grossly CGA, up to min A to steady on turns   Optometrist     Tilt Bed    Modified Rankin (Stroke Patients Only)       Balance Overall balance assessment: Needs assistance Sitting-balance support: No upper extremity supported, Feet supported Sitting balance-Leahy Scale: Good     Standing balance support: Bilateral upper extremity supported, During functional activity Standing balance-Leahy Scale: Fair Standing balance comment: pt able to statically stand with no UE and no LOB or unsteadiness, RW support dynamically                            Communication Communication Communication: Impaired Factors Affecting Communication: Hearing impaired  Cognition Arousal: Alert Behavior During Therapy: WFL for tasks assessed/performed   PT - Cognitive impairments: No apparent impairments  Following commands: Intact      Cueing Cueing Techniques: Verbal cues, Gestural cues, Visual cues  Exercises      General Comments General comments (skin integrity, edema, etc.): VSS on RA      Pertinent Vitals/Pain Pain  Assessment Pain Assessment: No/denies pain Pain Intervention(s): Monitored during session    Home Living                          Prior Function            PT Goals (current goals can now be found in the care plan section) Acute Rehab PT Goals PT Goal Formulation: With patient Time For Goal Achievement: 08/21/23 Progress towards PT goals: Progressing toward goals    Frequency    Min 2X/week      PT Plan      Co-evaluation              AM-PAC PT "6 Clicks" Mobility   Outcome Measure  Help needed turning from your back to your side while in a flat bed without using bedrails?: A Little Help needed moving from lying on your back to sitting on the side of a flat bed without using bedrails?: A Little Help needed moving to and from a bed to a chair (including a wheelchair)?: A Little Help needed standing up from a chair using your arms (e.g., wheelchair or bedside chair)?: A Little Help needed to walk in hospital room?: A Little Help needed climbing 3-5 steps with a railing? : Total 6 Click Score: 16    End of Session Equipment Utilized During Treatment: Gait belt Activity Tolerance: Patient tolerated treatment well;Patient limited by fatigue Patient left: in bed;with call bell/phone within reach;with bed alarm set Nurse Communication: Mobility status PT Visit Diagnosis: Muscle weakness (generalized) (M62.81);Difficulty in walking, not elsewhere classified (R26.2);Other abnormalities of gait and mobility (R26.89);Unsteadiness on feet (R26.81)     Time: 0454-0981 PT Time Calculation (min) (ACUTE ONLY): 23 min  Charges:    $Gait Training: 23-37 mins PT General Charges $$ ACUTE PT VISIT: 1 Visit                     Forrest Iha 08/11/2023, 4:32 PM

## 2023-08-11 NOTE — Progress Notes (Addendum)
 Rounding Note    Patient Name: John [name] Date of Encounter: 08/11/2023  [facility] Cardiologist: [name], MD   Subjective   No CP or dyspnea  Inpatient Medications    Scheduled Meds:  aspirin  EC  81 mg Oral Daily   atorvastatin   80 mg Oral QHS   Chlorhexidine  Gluconate Cloth  6 each Topical Daily   clopidogrel   75 mg Oral Q breakfast   vitamin B-12  1,000 mcg Oral Daily   feeding supplement  1 Container Oral TID BM   ferrous sulfate   325 mg Oral Q breakfast   finasteride   5 mg Oral Daily   Gerhardt's butt cream   Topical BID   insulin  aspart  0-9 Units Subcutaneous TID AC & HS   multivitamin with minerals  1 tablet Oral Daily   nystatin    Topical TID   pantoprazole   40 mg Oral QHS   potassium chloride   40 mEq Oral Once   sodium chloride  flush  3 mL Intravenous Q12H   tamsulosin   0.4 mg Oral Daily   thiamine   100 mg Oral Daily   Continuous Infusions:  sodium chloride       ceFAZolin  (ANCEF ) IV 2 g (08/11/23 0537)   heparin  1,100 Units/hr (08/11/23 0328)   lactated ringers  Stopped (08/10/23 1742)   potassium PHOSPHATE IVPB (in mmol)     PRN Meds: sodium chloride , acetaminophen  **OR** acetaminophen , hyoscyamine , ondansetron  **OR** ondansetron  (ZOFRAN ) IV, mouth rinse, sodium chloride  flush, traZODone    Vital Signs    Vitals:   08/10/23 2100 08/10/23 2200 08/11/23 0432 08/11/23 0818  BP:   129/70 121/70  Pulse: 81 92 75 73  Resp: 20 20 16 14   Temp:   98.3 F (36.8 C) 97.9 F (36.6 C)  TempSrc:   Oral Oral  SpO2: 96% 99% 99% 98%  Weight:      Height:        Intake/Output Summary (Last 24 hours) at 08/11/2023 0854 Last data filed at 08/11/2023 0442 Gross per 24 hour  Intake 583.15 ml  Output 1050 ml  Net -466.85 ml      08/08/2023    4:00 AM 08/06/2023   10:50 PM 07/13/2023    9:49 AM  Last 3 Weights  Weight (lbs) 202 lb 2.6 oz 198 lb 6.6 oz 206 lb  Weight (kg) 91.7 kg 90 kg 93.441 kg      Telemetry    Sinus- Personally  Reviewed   Physical Exam   GEN: No acute distress.   Neck: No JVD Cardiac: RRR, no murmurs, rubs, or gallops.  Respiratory: Clear to auscultation bilaterally. GI: Soft, nontender, non-distended  MS: No edema; radial cath site with no hematoma Neuro:  Nonfocal  Psych: Normal affect   Labs    High Sensitivity Troponin:   Recent Labs  Lab 08/10/23 1159 08/10/23 1411  TROPONINIHS 767* 750*     Chemistry Recent Labs  Lab 08/09/23 0503 08/10/23 0502 08/10/23 1153 08/11/23 0431  NA 138 135 138 139  K 3.2* 3.0* 3.4* 3.1*  CL 103 102 106 105  CO2 22 25 21* 24  GLUCOSE 140* 280* 301* 160*  BUN 13 10 10  7*  CREATININE 0.87 0.82 0.82 0.74  CALCIUM  8.2* 8.3* 8.5* 8.3*  MG 1.7 1.8  --  2.0  PROT 5.1* 5.0*  --  4.9*  ALBUMIN 2.3* 2.3*  --  2.3*  AST 31 27  --  20  ALT 16 18  --  14  ALKPHOS 46 52  --  44  BILITOT 1.7* 1.2  --  1.2  GFRNONAA >60 >60 >60 >60  ANIONGAP 13 8 11 10     Hematology Recent Labs  Lab 08/09/23 0503 08/10/23 0502 08/11/23 0431  WBC 6.7 6.0 6.8  RBC 3.57* 3.68* 3.39*  HGB 10.1* 10.2* 9.5*  HCT 30.1* 30.9* 28.8*  MCV 84.3 84.0 85.0  MCH 28.3 27.7 28.0  MCHC 33.6 33.0 33.0  RDW 18.5* 18.2* 18.3*  PLT 240 236 245   Thyroid   Recent Labs  Lab 08/06/23 1713  TSH 0.710    BNP Recent Labs  Lab 08/06/23 1713  BNP 307.2*     Radiology    CARDIAC CATHETERIZATION Addendum Date: 08/10/2023   3rd Mrg lesion is 70% stenosed.   Prox RCA lesion is 95% stenosed.   1st LPL lesion is 30% stenosed.   LPDA lesion is 80% stenosed.   Mid LAD-1 lesion is 95% stenosed.   Mid LAD-2 lesion is 50% stenosed.   Dist LAD lesion is 40% stenosed.   Prox LAD to Mid LAD lesion is 40% stenosed.   Lat 1st Diag lesion is 80% stenosed.   A drug-eluting stent was successfully placed using a STENT SYNERGY XD 2.50X28.   Post intervention, there is a 0% residual stenosis.   Post intervention, there is a 0% residual stenosis.   LV end diastolic pressure is mildly elevated. 1.   Left dominant coronary arteries with significant three-vessel coronary artery disease.  The culprit is severe stenosis in the mid LAD.  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 2.  Left ventricular angiography was not diagnostic.  Will obtain an echocardiogram.  LVEDP was mildly to moderately elevated at 20 mmHg. 3.  Successful angioplasty and drug-eluting stent placement to the mid LAD. Recommendations: Will treat with aspirin  and clopidogrel  for now.  Heparin  can be resumed 2 hours after TR band removal given that he is on anticoagulation for pulmonary embolism.  Aspirin  can be stopped once the patient is started on a DOAC.  Clopidogrel  should be continued for at least 6 months. The rest of small vessel disease should be managed medically. Unfortunately, his cholecystectomy will need to be postponed.  Result Date: 08/10/2023   3rd Mrg lesion is 70% stenosed.   Prox RCA lesion is 95% stenosed.   1st LPL lesion is 30% stenosed.   LPDA lesion is 80% stenosed.   Mid LAD-1 lesion is 95% stenosed.   Mid LAD-2 lesion is 50% stenosed.   Dist LAD lesion is 40% stenosed.   Prox LAD to Mid LAD lesion is 40% stenosed.   Lat 1st Diag lesion is 80% stenosed.   A drug-eluting stent was successfully placed using a STENT SYNERGY XD 2.50X28.   Post intervention, there is a 0% residual stenosis.   Post intervention, there is a 0% residual stenosis.   LV end diastolic pressure is mildly elevated. 1.  Left dominant coronary arteries with significant three-vessel coronary artery disease.  The culprit is severe stenosis in the mid LAD.  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 2.  Left ventricular angiography was not diagnostic.  Will obtain an echocardiogram.  LVEDP was mildly to moderately elevated at 20 mmHg. 3.  Successful angioplasty and drug-eluting stent placement  to the mid LAD. Recommendations: Will treat with aspirin  and clopidogrel  for now.  Heparin  can be  resumed 2 hours after TR band removal given that he is on anticoagulation for pulmonary embolism.  Aspirin  can be stopped once the patient is started on a DOAC.  Clopidogrel  should be continued for at least 6 months. The rest of small vessel disease should be managed medically.   CT HEAD WO CONTRAST ( ) Result Date: 08/10/2023 CLINICAL DATA:  Altered mental status, fell 1 week ago. EXAM: CT HEAD WITHOUT CONTRAST TECHNIQUE: Contiguous axial images were obtained from the base of the skull through the vertex without intravenous contrast. RADIATION DOSE REDUCTION: This exam was performed according to the departmental dose-optimization program which includes automated exposure control, adjustment of the mA and/or kV according to patient size and/or use of iterative reconstruction technique. COMPARISON:  08/06/2023 FINDINGS: Brain: No change or acute finding. Chronic small-vessel ischemic changes affect pons. 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 Chronic small-vessel ischemic changes of the white matter. No sign of acute infarction, mass lesion, hemorrhage, hydrocephalus or extra-axial collection. Vascular: There is atherosclerotic calcification of the major vessels at the base of the brain. Skull: Negative Sinuses/Orbits: Clear/normal Other: None IMPRESSION: No acute finding by CT. Chronic small-vessel ischemic changes of the pons, thalami and cerebral hemispheric white matter. Electronically Signed   By: Bettylou Brunner M.D.   On: 08/10/2023 13:17    Patient Profile     78 y.o. male with past medical history of hypertension, diabetes mellitus, history of breast cancer, recent CVA, status post recent pacemaker for complete heart block admitted with confusion, sepsis and cholecystitis for evaluation prior to surgery.  Patient had an episode of hypotension on the floor with systolic in the 60s associated with  anterior T wave changes.  Cardiac catheterization performed April 30 showed 95% proximal RCA, 80% PDA, 95% mid LAD, 80% first diagonal.  The culprit lesion was felt to be the mid LAD and patient had PCI with drug-eluting stent.  The right coronary artery was felt to have extensive small vessel disease and was nondominant and will be treated medically.    Assessment & Plan    1 NSTEMI/coronary artery disease status post PCI of the LAD-patient denies chest pain.  Plan to continue aspirin , Plavix , statin.  Await echocardiogram to reassess LV function.  Aspirin  will be discontinued once DOAC is resumed.  2 cholecystitis-cholecystectomy had been planned.  However given PCI of LAD yesterday this will need to be delayed.  Further management per general surgery.  Continue antibiotics.  3 status post pacemaker-device reprogrammed per EP.  4 history of recent pulmonary embolus-resume apixaban  once it is clear all procedures complete.  5 hypokalemia-supplement  For questions or updates, please contact [facility] Please consult [email or web address] for contact info under        Signed, [name], MD  08/11/2023, 8:54 AM

## 2023-08-11 NOTE — Progress Notes (Signed)
 Received new order for this patient, patient is already on caseload.  John [name] OT Acute Rehabilitation Services Office [phone]

## 2023-08-11 NOTE — Progress Notes (Signed)
  Alerted to cardiology of pts presence given recent remote with concerns for undersensing leading to PMT.   Industry interrogated device and adjusted V-sensing to Auto. Normal device function with no further episodes and no high pacing burden.   Can follow up as scheduled if discharged by then.   [name] [address] [place], PA-C  08/11/2023 7:49 AM

## 2023-08-12 DIAGNOSIS — A419 Sepsis, unspecified organism: Secondary | ICD-10-CM | POA: Diagnosis not present

## 2023-08-12 DIAGNOSIS — R6520 Severe sepsis without septic shock: Secondary | ICD-10-CM | POA: Diagnosis not present

## 2023-08-12 DIAGNOSIS — I214 Non-ST elevation (NSTEMI) myocardial infarction: Secondary | ICD-10-CM | POA: Diagnosis not present

## 2023-08-12 LAB — COMPREHENSIVE METABOLIC PANEL WITH GFR
ALT: 14 U/L (ref 0–44)
AST: 26 U/L (ref 15–41)
Albumin: 2.7 g/dL — ABNORMAL LOW (ref 3.5–5.0)
Alkaline Phosphatase: 54 U/L (ref 38–126)
Anion gap: 13 (ref 5–15)
BUN: 5 mg/dL — ABNORMAL LOW (ref 8–23)
CO2: 21 mmol/L — ABNORMAL LOW (ref 22–32)
Calcium: 8.9 mg/dL (ref 8.9–10.3)
Chloride: 105 mmol/L (ref 98–111)
Creatinine, Ser: 0.74 mg/dL (ref 0.61–1.24)
GFR, Estimated: >60 mL/min (ref >60)
Glucose, Bld: 174 mg/dL — ABNORMAL HIGH (ref 70–99)
Potassium: 3.7 mmol/L (ref 3.5–5.1)
Sodium: 139 mmol/L (ref 135–145)
Total Bilirubin: 1.4 mg/dL — ABNORMAL HIGH (ref 0.0–1.2)
Total Protein: 5.5 g/dL — ABNORMAL LOW (ref 6.5–8.1)

## 2023-08-12 LAB — CBC WITH DIFFERENTIAL/PLATELET
Abs Immature Granulocytes: 0.17 10*3/uL — ABNORMAL HIGH (ref 0.00–0.07)
Basophils Absolute: 0.1 10*3/uL (ref 0.0–0.1)
Basophils Relative: 1 %
Eosinophils Absolute: 0.3 10*3/uL (ref 0.0–0.5)
Eosinophils Relative: 5 %
HCT: 33.7 % — ABNORMAL LOW (ref 39.0–52.0)
Hemoglobin: 10.8 g/dL — ABNORMAL LOW (ref 13.0–17.0)
Immature Granulocytes: 2 %
Lymphocytes Relative: 24 %
Lymphs Abs: 1.7 10*3/uL (ref 0.7–4.0)
MCH: 27.8 pg (ref 26.0–34.0)
MCHC: 32 g/dL (ref 30.0–36.0)
MCV: 86.6 fL (ref 80.0–100.0)
Monocytes Absolute: 0.5 10*3/uL (ref 0.1–1.0)
Monocytes Relative: 7 %
Neutro Abs: 4.5 10*3/uL (ref 1.7–7.7)
Neutrophils Relative %: 61 %
Platelets: 276 10*3/uL (ref 150–400)
RBC: 3.89 MIL/uL — ABNORMAL LOW (ref 4.22–5.81)
RDW: 18.5 % — ABNORMAL HIGH (ref 11.5–15.5)
WBC: 7.4 10*3/uL (ref 4.0–10.5)
nRBC: 0.3 % — ABNORMAL HIGH (ref 0.0–0.2)

## 2023-08-12 LAB — LIPOPROTEIN A (LPA): Lipoprotein (a): 74.1 nmol/L — ABNORMAL HIGH (ref <75.0)

## 2023-08-12 LAB — GLUCOSE, CAPILLARY
Glucose-Capillary: 157 mg/dL — ABNORMAL HIGH (ref 70–99)
Glucose-Capillary: 236 mg/dL — ABNORMAL HIGH (ref 70–99)
Glucose-Capillary: 283 mg/dL — ABNORMAL HIGH (ref 70–99)
Glucose-Capillary: 280 mg/dL — ABNORMAL HIGH (ref 70–99)

## 2023-08-12 LAB — MAGNESIUM: Magnesium: 1.8 mg/dL (ref 1.7–2.4)

## 2023-08-12 LAB — CULTURE, BLOOD (ROUTINE X 2)

## 2023-08-12 MED ORDER — SPIRONOLACTONE 12.5 MG HALF TABLET
12.5000 mg | ORAL_TABLET | Freq: Every day | ORAL | Status: DC
  Administered 2023-08-12 – 2023-08-13 (×2): 12.5 mg via ORAL
  Filled 2023-08-12 (×2): qty 1

## 2023-08-12 MED ORDER — CARVEDILOL 3.125 MG PO TABS
3.1250 mg | ORAL_TABLET | Freq: Two times a day (BID) | ORAL | Status: DC
  Administered 2023-08-12 – 2023-08-13 (×3): 3.125 mg via ORAL
  Filled 2023-08-12 (×3): qty 1

## 2023-08-12 MED ORDER — TRAZODONE HCL 50 MG PO TABS
50.0000 mg | ORAL_TABLET | Freq: Every evening | ORAL | Status: DC | PRN
  Administered 2023-08-12: 50 mg via ORAL

## 2023-08-12 MED ORDER — BENZONATATE 100 MG PO CAPS
100.0000 mg | ORAL_CAPSULE | Freq: Three times a day (TID) | ORAL | Status: DC | PRN
  Administered 2023-08-12: 100 mg via ORAL
  Filled 2023-08-12 (×2): qty 1

## 2023-08-12 NOTE — Plan of Care (Signed)
   Problem: Fluid Volume: Goal: Hemodynamic stability will improve Outcome: Progressing   Problem: Clinical Measurements: Goal: Diagnostic test results will improve Outcome: Progressing Goal: Signs and symptoms of infection will decrease Outcome: Progressing   Problem: Respiratory: Goal: Ability to maintain adequate ventilation will improve Outcome: Progressing

## 2023-08-12 NOTE — Progress Notes (Signed)
   Heart Failure Stewardship Pharmacist Progress Note   PCP: Jobe Mulder, DO PCP-Cardiologist: [name], MD    HPI:  78 yo M with PMH of T2DM, BPH s/p chronic foley catheter, depression, GERD, HLD, HTN, breast cancer, and h/o DVT/PE.     Presented to the ED on 4/26 with mechanical fall and confusion. In the ED, was febrile, hypertensive, and tachycardic. Admitted with sepsis due to UTI, ecoli bacteremia, and cholelithiasis. Surgery consulted and planned for laparoscopic cholecystectomy. Was scheduled for 4/30 but patient delayed secondary to other emergencies. He had an episode that afternoon with acute weakness, cool clammy, and hypotensive. Trop 767.  Cardiology was consulted for this and preoperative evaluation. LHC was completed on 4/30 and he was found to have significant three vessel disease. Culprit was severe stenosis of the mid LAD s/p PCI with DES. ECHO 5/1 showed LVEF 30-35% (was 60-65% in 04/2023), RWMA, G1DD, mild concentric LVH, RV normal. Given new cardiac issues, surgery is no longer recommending cholecystectomy as inpatient. May discharge to CIR.   Patient denies shortness of breath. No edema. States that he is feeling frustrated that he is unable to get his gallbladder taken care of this admission. Confused about plans for CIR vs home. Reports that when he took ramipril in the past, "it almost killed me." States that he had several problems with [facility] and [facility] in the past. Has switched to using [facility] pharmacy with [facility] and is much happier. Agreeable to using [facility] TOC pharmacy at discharge.   Current HF Medications: None  Prior to admission HF Medications: SGLT2i: Jardiance  25 mg daily  Pertinent Lab Values: Labs pending 5/2 As of 5/1: Serum creatinine 0.74, BUN 7, Potassium 3.1, Sodium 139, BNP 307.2, Magnesium  2.0, A1c 7.5   Vital Signs: Weight: 202 lbs (admission weight: 198 lbs) Blood pressure: 120/60s  Heart rate: 70s  I/O: net -0.8L  yesterday; net -6.7L since admission  Medication Assistance / Insurance Benefits Check: Does the patient have prescription insurance?  Yes Type of insurance plan: BCBS Medicare  Outpatient Pharmacy:  Prior to admission outpatient pharmacy: [facility] Is the patient willing to use [facility] TOC pharmacy at discharge? Yes    Assessment: 1. 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 NYHA class II symptoms. - Does not appear volume overloaded on exam. Strict I/Os and daily weights. Keep K>4 and Mg>2.  - Consider starting carvedilol  3.125 mg BID - No ACE/ARB/ARNI with history of anaphylaxis with ramipril - Consider starting spironolactone  12.5 mg daily - No SGLT2i with chronic foley   Plan: 1) Medication changes recommended at this time: - Start carvedilol  3.125 mg BID - Start spironolactone  12.5 mg daily  2) Patient assistance: - None pending  3)  Education  - Patient has been educated on current HF medications and potential additions to HF medication regimen - Patient verbalizes understanding that over the next few months, these medication doses may change and more medications may be added to optimize HF regimen - Patient has been educated on basic disease state pathophysiology and goals of therapy   Jerilyn Monte, PharmD, BCPS Heart Failure Stewardship Pharmacist Phone [phone]

## 2023-08-12 NOTE — Progress Notes (Signed)
 This chaplain is present for F/U spiritual care as requested by the Pt. The Pt. wife-Vicky is visiting at the bedside.   The Pt. is eager to introduce the chaplain to [facility] as "the person who helped him get through the day." The Pt. is referring to Thursday's visit.   Today, the Pt. presents himself with less anxiety and is able to share his possible admission with CIR and questions about his heart healthy diet. The Pt. plans to participate in therapy and talk to Food Services about a salt substitute with his heart healthy diet.  The Pt. wife-Vicky is requesting more information about next week's heart appointment. The Pt. is sharing pictures with the chaplain when the RN arrived for patient care. This chaplain will plan a revisit.   Chaplain Kathleene Papas [phone]

## 2023-08-12 NOTE — Progress Notes (Addendum)
 Rounding Note    Patient Name: John [name] Date of Encounter: 08/12/2023  [facility] Cardiologist: [name], MD   Subjective   Pt denies CP or dyspnea  Inpatient Medications    Scheduled Meds:  apixaban   5 mg Oral BID   atorvastatin   80 mg Oral QHS   Chlorhexidine  Gluconate Cloth  6 each Topical Daily   clopidogrel   75 mg Oral Q breakfast   vitamin B-12  1,000 mcg Oral Daily   feeding supplement  1 Container Oral TID BM   ferrous sulfate   325 mg Oral Q breakfast   finasteride   5 mg Oral Daily   Gerhardt's butt cream   Topical BID   insulin  aspart  0-9 Units Subcutaneous TID AC & HS   multivitamin with minerals  1 tablet Oral Daily   nystatin    Topical TID   pantoprazole   40 mg Oral QHS   sodium chloride  flush  3 mL Intravenous Q12H   tamsulosin   0.4 mg Oral Daily   thiamine   100 mg Oral Daily   Continuous Infusions:   ceFAZolin  (ANCEF ) IV 2 g (08/12/23 0616)   PRN Meds: acetaminophen  **OR** acetaminophen , bacitracin , hyoscyamine , ondansetron  **OR** ondansetron  (ZOFRAN ) IV, mouth rinse, sodium chloride  flush, traZODone    Vital Signs    Vitals:   08/11/23 0818 08/11/23 1943 08/12/23 0600 08/12/23 0805  BP: 121/70 130/70 129/67   Pulse: 73 90  73  Resp: 14 18  18   Temp: 97.9 F (36.6 C) 97.9 F (36.6 C) 97.9 F (36.6 C)   TempSrc: Oral Oral Axillary   SpO2: 98% 99%  97%  Weight:      Height:        Intake/Output Summary (Last 24 hours) at 08/12/2023 0843 Last data filed at 08/12/2023 0616 Gross per 24 hour  Intake 240 ml  Output 2325 ml  Net -2085 ml      08/08/2023    4:00 AM 08/06/2023   10:50 PM 07/13/2023    9:49 AM  Last 3 Weights  Weight (lbs) 202 lb 2.6 oz 198 lb 6.6 oz 206 lb  Weight (kg) 91.7 kg 90 kg 93.441 kg      Telemetry    Sinus- Personally Reviewed   Physical Exam   GEN: NAD  Neck: Supple Cardiac: RRR Respiratory: CTA GI: Soft, NT/ND MS: No edema Neuro:  Grossly intact Psych: Normal affect   Labs     High Sensitivity Troponin:   Recent Labs  Lab 08/10/23 1159 08/10/23 1411  TROPONINIHS 767* 750*     Chemistry Recent Labs  Lab 08/09/23 0503 08/10/23 0502 08/10/23 1153 08/11/23 0431  NA 138 135 138 139  K 3.2* 3.0* 3.4* 3.1*  CL 103 102 106 105  CO2 22 25 21* 24  GLUCOSE 140* 280* 301* 160*  BUN 13 10 10  7*  CREATININE 0.87 0.82 0.82 0.74  CALCIUM  8.2* 8.3* 8.5* 8.3*  MG 1.7 1.8  --  2.0  PROT 5.1* 5.0*  --  4.9*  ALBUMIN 2.3* 2.3*  --  2.3*  AST 31 27  --  20  ALT 16 18  --  14  ALKPHOS 46 52  --  44  BILITOT 1.7* 1.2  --  1.2  GFRNONAA >60 >60 >60 >60  ANIONGAP 13 8 11 10     Hematology Recent Labs  Lab 08/09/23 0503 08/10/23 0502 08/11/23 0431  WBC 6.7 6.0 6.8  RBC 3.57* 3.68* 3.39*  HGB 10.1* 10.2* 9.5*  HCT 30.1* 30.9* 28.8*  MCV 84.3 84.0 85.0  MCH 28.3 27.7 28.0  MCHC 33.6 33.0 33.0  RDW 18.5* 18.2* 18.3*  PLT 240 236 245   Thyroid   Recent Labs  Lab 08/06/23 1713  TSH 0.710    BNP Recent Labs  Lab 08/06/23 1713  BNP 307.2*     Radiology    ECHOCARDIOGRAM COMPLETE Result Date: 08/11/2023    ECHOCARDIOGRAM REPORT   Patient Name:   John [name] Date of Exam: 08/11/2023 Medical Rec #:  [id number]        Height:       72.0 in Accession #:    [id number]       Weight:       202.2 lb Date of Birth:  03/20/1946        BSA:          2.140 m Patient Age:    78 years         BP:           129/70 mmHg Patient Gender: M                HR:           74 bpm. Exam Location:  Inpatient Procedure: 2D Echo, Cardiac Doppler and Color Doppler (Both Spectral and Color            Flow Doppler were utilized during procedure). Indications:    122-I22.9 Subsequent ST elevation (STEM) and non-ST elevation                 (NSTEMI) myocardial infarction  History:        Patient has prior history of Echocardiogram examinations, most                 recent 05/10/2023.  Sonographer:    Andrena Bang Referring Phys: [id number] MUHAMMAD A ARIDA IMPRESSIONS  1. Left ventricular ejection  fraction, by estimation, is 30 to 35%. The left ventricle has moderately decreased function. The left ventricle demonstrates regional wall motion abnormalities with mid to apical anteroseptal and inferoseptal akinesis and akinesis of the apical inferior/anterior/lateral walls. Akinesis of the true apex. Findings suggest LAD territory infarction. No LV thrombus. There is mild concentric left ventricular hypertrophy. Left ventricular diastolic parameters are consistent with  Grade I diastolic dysfunction (impaired relaxation).  2. Right ventricular systolic function is normal. The right ventricular size is normal. There is mildly elevated pulmonary artery systolic pressure. The estimated right ventricular systolic pressure is 41.9 mmHg.  3. The aortic valve is tricuspid. There is mild calcification of the aortic valve. Aortic valve regurgitation is mild. No aortic stenosis is present.  4. The inferior vena cava is dilated in size with >50% respiratory variability, suggesting right atrial pressure of 8 mmHg.  5. The mitral valve is normal in structure. No evidence of mitral valve regurgitation. No evidence of mitral stenosis. FINDINGS  Left Ventricle: Left ventricular ejection fraction, by estimation, is 30 to 35%. The left ventricle has moderately decreased function. The left ventricle demonstrates regional wall motion abnormalities. Definity  contrast agent was given IV to delineate the left ventricular endocardial borders. The left ventricular internal cavity size was normal in size. There is mild concentric left ventricular hypertrophy. Left ventricular diastolic parameters are consistent with Grade I diastolic dysfunction (impaired relaxation). Right Ventricle: The right ventricular size is normal. No increase in right ventricular wall thickness. Right ventricular systolic function is normal. There is mildly elevated pulmonary artery systolic pressure. The  tricuspid regurgitant velocity is 2.91  m/s, and with an  assumed right atrial pressure of 8 mmHg, the estimated right ventricular systolic pressure is 41.9 mmHg. Left Atrium: Left atrial size was normal in size. Right Atrium: Right atrial size was normal in size. Pericardium: There is no evidence of pericardial effusion. Mitral Valve: The mitral valve is normal in structure. No evidence of mitral valve regurgitation. No evidence of mitral valve stenosis. Tricuspid Valve: The tricuspid valve is normal in structure. Tricuspid valve regurgitation is trivial. Aortic Valve: The aortic valve is tricuspid. There is mild calcification of the aortic valve. Aortic valve regurgitation is mild. No aortic stenosis is present. Aortic valve mean gradient measures 3.0 mmHg. Aortic valve peak gradient measures 6.5 mmHg. Aortic valve area, by VTI measures 2.34 cm. Pulmonic Valve: The pulmonic valve was normal in structure. Pulmonic valve regurgitation is trivial. Aorta: The aortic root is normal in size and structure. Venous: The inferior vena cava is dilated in size with greater than 50% respiratory variability, suggesting right atrial pressure of 8 mmHg. IAS/Shunts: No atrial level shunt detected by color flow Doppler. Additional Comments: A device lead is visualized in the right ventricle.  LEFT VENTRICLE PLAX 2D LVIDd:         5.20 cm      Diastology LVIDs:         3.70 cm      LV e' medial:    4.90 cm/s LV PW:         1.50 cm      LV E/e' medial:  15.3 LV IVS:        1.20 cm      LV e' lateral:   5.00 cm/s LVOT diam:     2.10 cm      LV E/e' lateral: 15.0 LV SV:         62 LV SV Index:   29 LVOT Area:     3.46 cm  LV Volumes (MOD) LV vol d, MOD A2C: 152.0 ml LV vol d, MOD A4C: 154.0 ml LV vol s, MOD A2C: 88.5 ml LV vol s, MOD A4C: 111.0 ml LV SV MOD A2C:     63.5 ml LV SV MOD A4C:     154.0 ml LV SV MOD BP:      55.3 ml RIGHT VENTRICLE RV S prime:     11.65 cm/s TAPSE (M-mode): 2.0 cm LEFT ATRIUM             Index LA diam:        4.10 cm 1.92 cm/m LA Vol (A2C):   45.8 ml 21.40  ml/m LA Vol (A4C):   45.4 ml 21.21 ml/m LA Biplane Vol: 48.4 ml 22.61 ml/m  AORTIC VALVE AV Area (Vmax):    2.24 cm AV Area (Vmean):   2.06 cm AV Area (VTI):     2.34 cm AV Vmax:           127.00 cm/s AV Vmean:          85.400 cm/s AV VTI:            0.264 m AV Peak Grad:      6.5 mmHg AV Mean Grad:      3.0 mmHg LVOT Vmax:         82.20 cm/s LVOT Vmean:        50.800 cm/s LVOT VTI:          0.178 m LVOT/AV VTI ratio: 0.67  AORTA Ao Asc  diam: 3.20 cm MITRAL VALVE                TRICUSPID VALVE MV Area (PHT): 3.81 cm     TR Peak grad:   33.9 mmHg MV Decel Time: 199 msec     TR Vmax:        291.00 cm/s MV E velocity: 74.90 cm/s MV A velocity: 108.00 cm/s  SHUNTS MV E/A ratio:  0.69         Systemic VTI:  0.18 m                             Systemic Diam: 2.10 cm Dalton McleanMD Electronically signed by Archer Bear Signature Date/Time: 08/11/2023/10:48:36 AM    Final    CARDIAC CATHETERIZATION Addendum Date: 08/10/2023   3rd Mrg lesion is 70% stenosed.   Prox RCA lesion is 95% stenosed.   1st LPL lesion is 30% stenosed.   LPDA lesion is 80% stenosed.   Mid LAD-1 lesion is 95% stenosed.   Mid LAD-2 lesion is 50% stenosed.   Dist LAD lesion is 40% stenosed.   Prox LAD to Mid LAD lesion is 40% stenosed.   Lat 1st Diag lesion is 80% stenosed.   A drug-eluting stent was successfully placed using a STENT SYNERGY XD 2.50X28.   Post intervention, there is a 0% residual stenosis.   Post intervention, there is a 0% residual stenosis.   LV end diastolic pressure is mildly elevated. 1.  Left dominant coronary arteries with significant three-vessel coronary artery disease.  The culprit is severe stenosis in the mid LAD.  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 2.  Left ventricular angiography was not diagnostic.  Will obtain an echocardiogram.  LVEDP was mildly to moderately elevated at 20 mmHg. 3.  Successful angioplasty and drug-eluting  stent placement to the mid LAD. Recommendations: Will treat with aspirin  and clopidogrel  for now.  Heparin  can be resumed 2 hours after TR band removal given that he is on anticoagulation for pulmonary embolism.  Aspirin  can be stopped once the patient is started on a DOAC.  Clopidogrel  should be continued for at least 6 months. The rest of small vessel disease should be managed medically. Unfortunately, his cholecystectomy will need to be postponed.  Result Date: 08/10/2023   3rd Mrg lesion is 70% stenosed.   Prox RCA lesion is 95% stenosed.   1st LPL lesion is 30% stenosed.   LPDA lesion is 80% stenosed.   Mid LAD-1 lesion is 95% stenosed.   Mid LAD-2 lesion is 50% stenosed.   Dist LAD lesion is 40% stenosed.   Prox LAD to Mid LAD lesion is 40% stenosed.   Lat 1st Diag lesion is 80% stenosed.   A drug-eluting stent was successfully placed using a STENT SYNERGY XD 2.50X28.   Post intervention, there is a 0% residual stenosis.   Post intervention, there is a 0% residual stenosis.   LV end diastolic pressure is mildly elevated. 1.  Left dominant coronary arteries with significant three-vessel coronary artery disease.  The culprit is severe stenosis in the mid LAD.  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 2.  Left ventricular angiography was not diagnostic.  Will obtain an echocardiogram.  LVEDP was mildly to moderately elevated at 20 mmHg. 3.  Successful angioplasty and drug-eluting stent placement  to the mid LAD. Recommendations: Will treat with aspirin  and clopidogrel  for now.  Heparin  can be resumed 2 hours after TR band removal given that he is on anticoagulation for pulmonary embolism.  Aspirin  can be stopped once the patient is started on a DOAC.  Clopidogrel  should be continued for at least 6 months. The rest of small vessel disease should be managed medically.   CT HEAD WO CONTRAST ( ) Result Date: 08/10/2023 CLINICAL  DATA:  Altered mental status, fell 1 week ago. EXAM: CT HEAD WITHOUT CONTRAST TECHNIQUE: Contiguous axial images were obtained from the base of the skull through the vertex without intravenous contrast. RADIATION DOSE REDUCTION: This exam was performed according to the departmental dose-optimization program which includes automated exposure control, adjustment of the mA and/or kV according to patient size and/or use of iterative reconstruction technique. COMPARISON:  08/06/2023 FINDINGS: Brain: No change or acute finding. Chronic small-vessel ischemic changes affect pons. 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 Chronic small-vessel ischemic changes of the white matter. No sign of acute infarction, mass lesion, hemorrhage, hydrocephalus or extra-axial collection. Vascular: There is atherosclerotic calcification of the major vessels at the base of the brain. Skull: Negative Sinuses/Orbits: Clear/normal Other: None IMPRESSION: No acute finding by CT. Chronic small-vessel ischemic changes of the pons, thalami and cerebral hemispheric white matter. Electronically Signed   By: Bettylou Brunner M.D.   On: 08/10/2023 13:17    Patient Profile     78 y.o. male with past medical history of hypertension, diabetes mellitus, history of breast cancer, recent CVA, status post recent pacemaker for complete heart block admitted with confusion, sepsis and cholecystitis for evaluation prior to surgery.  Patient had an episode of hypotension on the floor with systolic in the 60s associated with anterior T wave changes.  Cardiac catheterization performed April 30 showed 95% proximal RCA, 80% PDA, 95% mid LAD, 80% first diagonal.  The culprit lesion was felt to be the mid LAD and patient had PCI with drug-eluting stent.  The right coronary artery was felt to have extensive small vessel disease and was nondominant and will be treated medically.  Follow-up echocardiogram showed ejection fraction 30 to 35%, mid to apical anteroseptal  and inferoseptal akinesis, grade 1 diastolic dysfunction, mild aortic insufficiency.  Assessment & Plan    1 NSTEMI/coronary artery disease status post PCI of the LAD-patient denies chest pain.  Plan to continue Plavix  and statin.  Aspirin  was discontinued as apixaban  was resumed.  2 ischemic cardiomyopathy-LV function severely reduced with septal/apical wall motion abnormality.  This may be myocardial stunning from transient ischemia.  We will plan to repeat echocardiogram in 3 months to see if LV function has improved.  Cannot add ARB or Arni due to history of anaphylaxis with ramipril.  Will begin carvedilol  3.125 mg daily and spironolactone  12.5 mg daily.  Would resume Jardiance  at discharge.  Patient is not volume overloaded on examination.  3 cholecystitis-cholecystectomy had been planned.  However given PCI of LAD this will need to be delayed.  Follow-up general surgery after discharge.  4 status post pacemaker-device reprogrammed per EP.  5 history of recent pulmonary embolus-apixaban  has been resumed.  Patient can be discharged from a cardiac standpoint.  Check potassium and renal function 1 week after discharge.  I will arrange follow-up with APP 2 weeks after discharge.   For questions or updates, please contact [facility] Please consult [email or web address] for contact info under        Signed, [name],  MD  08/12/2023, 8:43 AM

## 2023-08-12 NOTE — Progress Notes (Addendum)
 Occupational Therapy Treatment Patient Details Name: John [name] MRN: [id number] DOB: [birth year] Today's Date: 08/12/2023   History of present illness John [name] is a 78 y.o. male admitted 08/06/23 for suspected severe sepsis. Pt presented with AMS after a ground level fall at home. UA showing white and red blood cells as well as some moderate bacteria; urine cultures pending. CT abdomen showed distended gallbladder, with multiple calcified gallstones layering dependently. US  Abdomen demonstrated cholecystitis. PMH significant for T2DM, indwelling Foley catheter, GERD, BPH, HTN, HLD, s/p pacemaker, breast cancer, DVT/PE, and CVA.   OT comments  Pt. Seen for skilled OT treatment session.  Pain remains in L hip area but not other reported concerns from pt.  HR and o2 stable throughout session.  Bed mobility completed in/out with CGA with HOB elevated and use of rails.  Increased difficulty with sitting balance during attempted LB ADL tasks.  Several posterior LOB with heavy trunk support required for correction.  Would benefit from introduction of A/E next session to aide with LB ADLs.  Short distance in room ambulation with CGA/MIN A.  Intermittent cues for hand placement during transfers and RW management. Cont. With acute OT POC.  A/E next session for LB ADLs.         If plan is discharge home, recommend the following:  Two people to help with walking and/or transfers;A lot of help with bathing/dressing/bathroom;Assistance with cooking/housework;Direct supervision/assist for medications management;Direct supervision/assist for financial management;Assist for transportation;Help with stairs or ramp for entrance;Supervision due to cognitive status   Equipment Recommendations  None recommended by OT    Recommendations for Other Services      Precautions / Restrictions Precautions Precautions: Fall Recall of Precautions/Restrictions: Intact Precaution/Restrictions Comments: watch BP        Mobility Bed Mobility Overal bed mobility: Needs Assistance Bed Mobility: Supine to Sit, Sit to Supine     Supine to sit: HOB elevated, Used rails, Contact guard Sit to supine: Contact guard assist, HOB elevated, Used rails   General bed mobility comments: increased time to come to EOB-pt. reports he sleeps in a recliner., cues for hand placement to assist with pushing from eob but good carryover otherwise.    Transfers Overall transfer level: Needs assistance Equipment used: Rolling walker (2 wheels) Transfers: Sit to/from Stand, Bed to chair/wheelchair/BSC Sit to Stand: Contact guard assist, Min assist     Step pivot transfers: Contact guard assist     General transfer comment: CGA for saftey     Balance                                           ADL either performed or assessed with clinical judgement   ADL Overall ADL's : (P) Needs assistance/impaired                     Lower Body Dressing: Moderate assistance;Sitting/lateral leans;Cueing for compensatory techniques Lower Body Dressing Details (indicate cue type and reason): pt. with notable post. lob.  states he usually is seated on toilet when he completes majority of LB ADLs.  laying all the way back on side of bed with pillow support on lower opposing bed rail for safety and support while he attempted to don disposable briefs.  pain in L hip limiting ROM for attempted figure four approach.  will benefit from A/E introduction next session Toilet Transfer: Ambulation;Rolling  walker (2 wheels);Minimal assistance Toilet Transfer Details (indicate cue type and reason): simulated with ambulation around the bed and then back around bed and back to eob.  initial cues for hand placement to push up from eob but good carry over for the remaining portion of the session     Tub/ Shower Transfer: Minimal assistance   Functional mobility during ADLs: Contact guard assist;Minimal  assistance General ADL Comments: cues for RW management.    Extremity/Trunk Assessment              Occupational psychologist Communication: Impaired Factors Affecting Communication: Hearing impaired   Cognition Arousal: Alert Behavior During Therapy: WFL for tasks assessed/performed Cognition: No apparent impairments                               Following commands: Intact        Cueing   Cueing Techniques: Verbal cues, Gestural cues, Visual cues  Exercises      Shoulder Instructions       General Comments  Reviewed fall prevention and pt. Was able to state strategies for safety if/when he gets light headed or dizzy.  "I would sit down because I could fall".      Pertinent Vitals/ Pain       Pain Assessment Pain Assessment: Faces Faces Pain Scale: Hurts little more Pain Location: L hip and bottom where he fell Pain Descriptors / Indicators: Discomfort, Tender, Sore Pain Intervention(s): Limited activity within patient's tolerance, Repositioned  Home Living                                          Prior Functioning/Environment              Frequency  Min 2X/week        Progress Toward Goals  OT Goals(current goals can now be found in the care plan section)  Progress towards OT goals: Progressing toward goals     Plan      Co-evaluation                 AM-PAC OT "6 Clicks" Daily Activity     Outcome Measure   Help from another person eating meals?: A Little Help from another person taking care of personal grooming?: A Little Help from another person toileting, which includes using toliet, bedpan, or urinal?: A Little Help from another person bathing (including washing, rinsing, drying)?: A Little Help from another person to put on and taking off regular upper body clothing?: A Little Help from another person to put on and taking off regular lower body  clothing?: A Little 6 Click Score: 18    End of Session Equipment Utilized During Treatment: Gait belt;Rolling walker (2 wheels)  OT Visit Diagnosis: Unsteadiness on feet (R26.81);Other abnormalities of gait and mobility (R26.89);History of falling (Z91.81);Repeated falls (R29.6)   Activity Tolerance Patient tolerated treatment well   Patient Left in bed;with call bell/phone within reach;with bed alarm set   Nurse Communication Other (comment) (rn states ok to work with pt.)        Time: 3086-5784 OT Time Calculation (min): 27 min  Charges: OT General Charges $OT Visit: 1 Visit OT Treatments $Self Care/Home Management : 23-37 mins  Howell Macintosh,  COTA/L Acute Rehabilitation [phone]   Leory Rands Lorraine-COTA/L  08/12/2023, 9:18 AM

## 2023-08-12 NOTE — Progress Notes (Signed)
  Inpatient Rehabilitation Admissions Coordinator   I contacted patient's wife by phone to review estimated cost of care for CIR if approved and to answer her questions concerning the approval process.  Jeannetta Millman, RN, MSN Rehab Admissions Coordinator [phone] 08/12/2023 12:56 PM

## 2023-08-12 NOTE — Progress Notes (Signed)
 Heart Failure Navigator Progress Note  Assessed for Heart & Vascular TOC clinic readiness.  Patient does not meet criteria due to per wife patient will be going to Dr. Berry Bristol for his cardiac follow up. Patient mildly confused. Spoke with wife in detail on the phone. No HF TOC. Plan to possible go to CIR, [employer] pending.   Navigator will sign off at this time.   Randie Bustle, BSN, Scientist, clinical (histocompatibility and immunogenetics) Only

## 2023-08-12 NOTE — Inpatient Diabetes Management (Signed)
 Inpatient Diabetes Program Recommendations  AACE/ADA: New Consensus Statement on Inpatient Glycemic Control (2015)  Target Ranges:  Prepandial:   less than 140 mg/dL      Peak postprandial:   less than 180 mg/dL (1-2 hours)      Critically ill patients:  140 - 180 mg/dL   Lab Results  Component Value Date   GLUCAP 157 (H) 08/12/2023   HGBA1C 7.5 (H) 08/06/2023    Review of Glycemic Control  Latest Reference Range & Units 08/10/23 07:32 08/10/23 10:56 08/10/23 17:18 08/10/23 21:30 08/11/23 08:32  Glucose-Capillary 70 - 99 mg/dL 161 (H) 096 (H) 045 (H) 251 (H) 167 (H)    Latest Reference Range & Units 08/11/23 08:32 08/11/23 11:11 08/11/23 16:42 08/11/23 20:36 08/12/23 07:32  Glucose-Capillary 70 - 99 mg/dL 409 (H) 811 (H) 914 (H) 308 (H) 157 (H)   Diabetes history: DM 2 Outpatient Diabetes medications:  Jardiance  25 mg Daily, metformin  1000 mg bid Current orders for Inpatient glycemic control:  Novolog  0-9 units tid and hs  Boost tid between meals A1c 7.5% on 4/26  Note: glucose trends increase after meal/supplement intake  Inpatient Diabetes Program Recommendations:    -    May consider starting Novolog  2 units tid meal coverage if eating >50% of meals  Thanks, Eloise Hake RN, MSN, BC-ADM Inpatient Diabetes Coordinator Team Pager [phone] (8a-5p)

## 2023-08-12 NOTE — Progress Notes (Signed)
 Cardiology asked to call patient's wife to update.  Attempted to call her yesterday twice and each time go straight to voicemail.  I got paged again tonight at 1720 to try to call her again.  Tried paging number back but no answer.  Also attempted to call patient's wife once again today x 4 no answer.

## 2023-08-12 NOTE — Progress Notes (Signed)
 PROGRESS NOTE    John [name]  ZOX:[id number] DOB: 1945/04/16 DOA: 08/06/2023 PCP: Jobe Mulder, DO   Brief Narrative: 78 year old with past medical history significant for BPH status post Foley placement, depression, hypertension, diabetes type 2, hyperlipidemia, neuropathy who presented with worsening weakness, lethargy and recurrent falls at home.  He recently had Foley catheter placement by urology on 07/13/2023 in the setting of BPH and outflow obstruction.  Due to persistent weakness he presented for further evaluation.  Urinalysis was concerning for signs of infection.  Blood cultures were drawn and he was admitted for further workup.  Found to have E. coli bacteremia, sepsis secondary to UTI, imaging concerning for cholelithiasis, HIDA scan concerning for chronic cholecystitis or biliary akinesis. Had Hypotensive episode, associated with diaphoresis, pale. EKG showed T wave changes, elevated troponin, had Cath found to have MI, had stent placement. Cholecystectomy will be postpone out patient in 6 months. Surgery doesn't think patient has cholecystitis.   Assessment & Plan:   Principal Problem:   Severe sepsis (HCC) Active Problems:   E coli bacteremia   Acute urinary retention   Cholelithiasis   Insulin  dependent type 2 diabetes mellitus (HCC)   Fall at home, initial encounter   VTE (venous thromboembolism)   BPH (benign prostatic hyperplasia)   Heart block AV complete (HCC)   Peripheral neuropathy   GERD (gastroesophageal reflux disease)   Hyperlipidemia   Hypomagnesemia   Essential hypertension   Chronic diastolic CHF (congestive heart failure) (HCC)   Sacral pressure ulcer   B12 deficiency   Iron deficiency anemia   Non-ST elevation (NSTEMI) myocardial infarction (HCC)  1-Severe sepsis in the setting of  UTI and E. coli bacteremia - Patient presented with tachycardia, tachypnea, leukocytosis, lactic acidosis.  Source suspected UTI  - Patient received IV  fluids.  IV antibiotics. - Patient had recent Foley catheter change by [facility]/10.  Evaluated by urology on 4/27 who does not recommend Foley exchange at this time. Urine culture growing Klebsiella and E. Coli -Continue with IV Anceff, day 5. Repeated blood culture no growth to date. He will need 10-14 days tx  E. coli bacteremia: - Presumed translocation from urinary source versus possible GI source - Continue with IV Anceff Repeated blood culture 4/30 no growth to date.   MI/NSTEMI Hypotension,  Near syncope Patient became hypotensive  cold, clammy, pale, drowsy disoriented EKG some EKG changes T wave inversion Troponin ordered and elevated cardiology has been consulted CT head negative.  Cardiology recommend Cath, he underwent cath 4/30 which showed Left dominant coronary arteries with significant three-vessel coronary artery disease. The culprit is severe stenosis in the mid LAD. 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 Successful angioplasty and drug-eluting stent placement to the mid LAD  -Started on Plavix  and aspirin . Ok to discontinue aspirin  after eliquis  is resume per cardiology  -started on carvedilol .   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 : -Patient has had persistent vomiting postprandial especially with fatty meal over the last month - Right upper quadrant ultrasound cholelithiasis with gallbladder sludge. - HIDA 4/28 show ejection fraction 4% for chronic cholecystitis/biliary dyskinesia  - Surgery consulted plan was initially for cholecystectomy, due to events from 4/30, patient had MI, plan is for elective cholecystectomy in 6 months.  Per Dr Lanell Pinta, tx with 1 week antibiotics.   BPH acute urinary retention Has chronic Foley catheter He is leaking around the catheter, urology consulted. Nurse to do  flushes to catheter.   VTE, diagnosed DVT left femoral  vein SF pain 1/25 -History of PE right lower lobe 1/25 He has been compliant with Eliquis  Resume eliquis  no procedure planned./   Fall at home Needs PT>    Diabetes type 2 -SSI.   Heart block completed status post pacemaker - Pacemaker placed 05/12/2023   Iron deficiency anemia - Started on replacement  B12 deficiency - He was started on B12 replacement  Sacral pressure ulcer - See wound care documentation below  Chronic diastolic heart failure - Recent echo 04/2023 ejection fraction 65%. -.  IV fluids  Essential hypertension - Not on blood pressure medication at home and currently blood pressure soft  Hypomagnesemia Replace IV magnesium   Hyperlipidemia Continue with Lipitor   GERD PPI  Hypokalemia: Replete Hypophosphatemia; replaced.   Wound care documentation below Pressure Injury 05/11/23 Sacrum Right;Left;Mid Stage 1 -  Intact skin with non-blanchable redness of a localized area usually over a bony prominence. (Active)  05/11/23 1600  Location: Sacrum  Location Orientation: Right;Left;Mid  Staging: Stage 1 -  Intact skin with non-blanchable redness of a localized area usually over a bony prominence.  Wound Description (Comments):   Present on Admission:      Pressure Injury 08/06/23 Sacrum Medial Stage 1 -  Intact skin with non-blanchable redness of a localized area usually over a bony prominence. (Active)  08/06/23 2250  Location: Sacrum  Location Orientation: Medial  Staging: Stage 1 -  Intact skin with non-blanchable redness of a localized area usually over a bony prominence.  Wound Description (Comments):   Present on Admission: Yes  Dressing Type Foam - Lift dressing to assess site every shift 08/11/23 0710     Nutrition Problem: Severe Malnutrition Etiology: chronic illness    Signs/Symptoms: mild fat depletion, severe muscle depletion, percent weight loss (within the last 6 months he has lost 50 lbs, 20% weight loss.) Percent weight loss:  20 %    Interventions: Refer to RD note for recommendations  Estimated body mass index is 27.42 kg/m as calculated from the following:   Height as of this encounter: 6' (1.829 m).   Weight as of this encounter: 91.7 kg.   DVT prophylaxis: heparin  gtt Code Status: DNR Family Communication: wife over phone  4/30  5/01 Disposition Plan:  Status is: Inpatient Remains inpatient appropriate because: management of sepsis.     Consultants:  General Sx Cardiology   Procedures:    Antimicrobials:    Subjective: No new complaints. We talk about going to CIR, he agrees  Objective: Vitals:   08/12/23 0600 08/12/23 0805 08/12/23 0900 08/12/23 0946  BP: 129/67   122/67  Pulse: 77 73 87   Resp: 18 18 15  (!) 27  Temp: 97.9 F (36.6 C)     TempSrc: Axillary     SpO2: 94% 97% 98%   Weight:      Height:        Intake/Output Summary (Last 24 hours) at 08/12/2023 1512 Last data filed at 08/12/2023 1610 Gross per 24 hour  Intake 240 ml  Output 2325 ml  Net -2085 ml   Filed Weights   08/06/23 2250 08/08/23 0400  Weight: 90 kg 91.7 kg    Examination:  General exam: NAD Respiratory system: CTA Cardiovascular system: s 1, S 2 RRR Gastrointestinal system: BS present, soft nt Central nervous system: Alert, follows command Extremities: no edema   Data Reviewed: I have personally reviewed following labs and imaging studies  CBC: Recent  Labs  Lab 08/08/23 1610 08/09/23 0503 08/10/23 0502 08/11/23 0431 08/12/23 1019  WBC 7.8 6.7 6.0 6.8 7.4  NEUTROABS 6.2 5.0 3.8 3.9 4.5  HGB 9.3* 10.1* 10.2* 9.5* 10.8*  HCT 28.8* 30.1* 30.9* 28.8* 33.7*  MCV 86.7 84.3 84.0 85.0 86.6  PLT 239 240 236 245 276   Basic Metabolic Panel: Recent Labs  Lab 08/06/23 2344 08/07/23 0208 08/08/23 0531 08/09/23 0503 08/10/23 0502 08/10/23 1153 08/11/23 0431 08/12/23 1019  NA  --  137 139 138 135 138 139 139  K  --  3.7 3.6 3.2* 3.0* 3.4* 3.1* 3.7  CL  --  101 104 103 102 106 105 105   CO2  --  19* 20* 22 25 21* 24 21*  GLUCOSE  --  186* 148* 140* 280* 301* 160* 174*  BUN  --  12 16 13 10 10  7* 5*  CREATININE  --  1.02 1.05 0.87 0.82 0.82 0.74 0.74  CALCIUM   --  8.2* 8.5* 8.2* 8.3* 8.5* 8.3* 8.9  MG 1.0* 2.2 1.7 1.7 1.8  --  2.0 1.8  PHOS 4.2 4.4  --  2.0* 2.5  --  2.2*  --    GFR: Estimated Creatinine Clearance: 84.9 mL/min (by C-G formula based on SCr of 0.74 mg/dL). Liver Function Tests: Recent Labs  Lab 08/08/23 1452 08/09/23 0503 08/10/23 0502 08/11/23 0431 08/12/23 1019  AST 35 31 27 20 26   ALT 16 16 18 14 14   ALKPHOS 50 46 52 44 54  BILITOT 1.2 1.7* 1.2 1.2 1.4*  PROT 5.8* 5.1* 5.0* 4.9* 5.5*  ALBUMIN 2.6* 2.3* 2.3* 2.3* 2.7*   No results for input(s): "LIPASE", "AMYLASE" in the last 168 hours. Recent Labs  Lab 08/06/23 2344  AMMONIA 20   Coagulation Profile: Recent Labs  Lab 08/06/23 2344  INR 1.4*   Cardiac Enzymes: Recent Labs  Lab 08/06/23 2344  CKTOTAL 111   BNP (last 3 results) No results for input(s): "PROBNP" in the last 8760 hours. HbA1C: No results for input(s): "HGBA1C" in the last 72 hours. CBG: Recent Labs  Lab 08/11/23 1111 08/11/23 1642 08/11/23 2036 08/12/23 0732 08/12/23 1149  GLUCAP 184* 263* 308* 157* 236*   Lipid Profile: No results for input(s): "CHOL", "HDL", "LDLCALC", "TRIG", "CHOLHDL", "LDLDIRECT" in the last 72 hours. Thyroid  Function Tests: No results for input(s): "TSH", "T4TOTAL", "FREET4", "T3FREE", "THYROIDAB" in the last 72 hours. Anemia Panel: No results for input(s): "VITAMINB12", "FOLATE", "FERRITIN", "TIBC", "IRON", "RETICCTPCT" in the last 72 hours. Sepsis Labs: Recent Labs  Lab 08/06/23 2344 08/07/23 0208 08/10/23 1153 08/10/23 1411  PROCALCITON 9.53  --   --   --   LATICACIDVEN 2.1* 1.3 2.1* 1.8    Recent Results (from the past 240 hours)  Blood Culture (routine x 2)     Status: Abnormal (Preliminary result)   Collection Time: 08/06/23  5:13 PM   Specimen: BLOOD  Result  Value Ref Range Status   Specimen Description BLOOD RIGHT ANTECUBITAL  Final   Special Requests   Final    BOTTLES DRAWN AEROBIC ONLY Blood Culture results may not be optimal due to an inadequate volume of blood received in culture bottles   Culture  Setup Time   Final    GRAM NEGATIVE RODS AEROBIC BOTTLE ONLY CRITICAL VALUE NOTED.  VALUE IS CONSISTENT WITH PREVIOUSLY REPORTED AND CALLED VALUE. Performed at [facility], [address], [place], [place] [address]    Culture ESCHERICHIA COLI (A)  Final  Report Status PENDING  Incomplete  Urine Culture     Status: Abnormal   Collection Time: 08/06/23  5:13 PM   Specimen: Urine, Random  Result Value Ref Range Status   Specimen Description URINE, RANDOM  Final   Special Requests   Final    URINE, CATHETERIZED Performed at [facility], [address], [place], [place] [address]    Culture (A)  Final    >=100,000 COLONIES/mL KLEBSIELLA PNEUMONIAE >=100,000 COLONIES/mL ESCHERICHIA COLI    Report Status 08/09/2023 FINAL  Final   Organism ID, Bacteria KLEBSIELLA PNEUMONIAE (A)  Final   Organism ID, Bacteria ESCHERICHIA COLI (A)  Final      Susceptibility   Escherichia coli - MIC*    AMPICILLIN 4 SENSITIVE Sensitive     CEFAZOLIN  <=4 SENSITIVE Sensitive     CEFEPIME  <=0.12 SENSITIVE Sensitive     CEFTRIAXONE  <=0.25 SENSITIVE Sensitive     CIPROFLOXACIN <=0.25 SENSITIVE Sensitive     GENTAMICIN  <=1 SENSITIVE Sensitive     IMIPENEM <=0.25 SENSITIVE Sensitive     NITROFURANTOIN <=16 SENSITIVE Sensitive     TRIMETH/SULFA <=20 SENSITIVE Sensitive     AMPICILLIN/SULBACTAM <=2 SENSITIVE Sensitive     PIP/TAZO <=4 SENSITIVE Sensitive ug/mL    * >=100,000 COLONIES/mL ESCHERICHIA COLI   Klebsiella pneumoniae - MIC*    AMPICILLIN >=32 RESISTANT Resistant     CEFAZOLIN  <=4 SENSITIVE Sensitive     CEFEPIME  <=0.12 SENSITIVE Sensitive     CEFTRIAXONE  <=0.25 SENSITIVE Sensitive     CIPROFLOXACIN <=0.25 SENSITIVE Sensitive      GENTAMICIN  <=1 SENSITIVE Sensitive     IMIPENEM <=0.25 SENSITIVE Sensitive     NITROFURANTOIN 64 INTERMEDIATE Intermediate     TRIMETH/SULFA <=20 SENSITIVE Sensitive     AMPICILLIN/SULBACTAM 4 SENSITIVE Sensitive     PIP/TAZO <=4 SENSITIVE Sensitive ug/mL    * >=100,000 COLONIES/mL KLEBSIELLA PNEUMONIAE  Blood Culture (routine x 2)     Status: Abnormal   Collection Time: 08/06/23  5:26 PM   Specimen: BLOOD RIGHT HAND  Result Value Ref Range Status   Specimen Description BLOOD RIGHT HAND  Final   Special Requests   Final    BOTTLES DRAWN AEROBIC AND ANAEROBIC Blood Culture results may not be optimal due to an inadequate volume of blood received in culture bottles   Culture  Setup Time   Final    GRAM NEGATIVE RODS ANAEROBIC BOTTLE ONLY CRITICAL RESULT CALLED TO, READ BACK BY AND VERIFIED WITH: JWYLAND,[email or web address]@[email or web address]  08/07/23 MK Performed at [facility], [address] [place], [place] [address]    Culture ESCHERICHIA COLI (A)  Final   Report Status 08/09/2023 FINAL  Final   Organism ID, Bacteria ESCHERICHIA COLI  Final   Organism ID, Bacteria ESCHERICHIA COLI  Final      Susceptibility   Escherichia coli - KIRBY BAUER*    CEFAZOLIN  SENSITIVE Sensitive    Escherichia coli - MIC*    AMPICILLIN 4 SENSITIVE Sensitive     CEFEPIME  <=0.12 SENSITIVE Sensitive     CEFTAZIDIME <=1 SENSITIVE Sensitive     CEFTRIAXONE  <=0.25 SENSITIVE Sensitive     CIPROFLOXACIN <=0.25 SENSITIVE Sensitive     GENTAMICIN  <=1 SENSITIVE Sensitive     IMIPENEM <=0.25 SENSITIVE Sensitive     TRIMETH/SULFA <=20 SENSITIVE Sensitive     AMPICILLIN/SULBACTAM <=2 SENSITIVE Sensitive     PIP/TAZO <=4 SENSITIVE Sensitive ug/mL    * ESCHERICHIA COLI    ESCHERICHIA COLI  Blood Culture ID Panel (Reflexed)  Status: Abnormal   Collection Time: 08/06/23  5:26 PM  Result Value Ref Range Status   Enterococcus faecalis NOT DETECTED NOT DETECTED Final   Enterococcus Faecium NOT DETECTED NOT DETECTED Final    Listeria monocytogenes NOT DETECTED NOT DETECTED Final   Staphylococcus species NOT DETECTED NOT DETECTED Final   Staphylococcus aureus (BCID) NOT DETECTED NOT DETECTED Final   Staphylococcus epidermidis NOT DETECTED NOT DETECTED Final   Staphylococcus lugdunensis NOT DETECTED NOT DETECTED Final   Streptococcus species NOT DETECTED NOT DETECTED Final   Streptococcus agalactiae NOT DETECTED NOT DETECTED Final   Streptococcus pneumoniae NOT DETECTED NOT DETECTED Final   Streptococcus pyogenes NOT DETECTED NOT DETECTED Final   A.calcoaceticus-baumannii NOT DETECTED NOT DETECTED Final   Bacteroides fragilis NOT DETECTED NOT DETECTED Final   Enterobacterales DETECTED (A) NOT DETECTED Final    Comment: Enterobacterales represent a large order of gram negative bacteria, not a single organism. CRITICAL RESULT CALLED TO, READ BACK BY AND VERIFIED WITH: J WYLAND,PHARMD@[email or web address]  08/07/23 MK    Enterobacter cloacae complex NOT DETECTED NOT DETECTED Final   Escherichia coli DETECTED (A) NOT DETECTED Final    Comment: CRITICAL RESULT CALLED TO, READ BACK BY AND VERIFIED WITH: J WYLAND,PHARMD@[email or web address]  08/07/23 MK    Klebsiella aerogenes NOT DETECTED NOT DETECTED Final   Klebsiella oxytoca NOT DETECTED NOT DETECTED Final   Klebsiella pneumoniae NOT DETECTED NOT DETECTED Final   Proteus species NOT DETECTED NOT DETECTED Final   Salmonella species NOT DETECTED NOT DETECTED Final   Serratia marcescens NOT DETECTED NOT DETECTED Final   Haemophilus influenzae NOT DETECTED NOT DETECTED Final   Neisseria meningitidis NOT DETECTED NOT DETECTED Final   Pseudomonas aeruginosa NOT DETECTED NOT DETECTED Final   Stenotrophomonas maltophilia NOT DETECTED NOT DETECTED Final   Candida albicans NOT DETECTED NOT DETECTED Final   Candida auris NOT DETECTED NOT DETECTED Final   Candida glabrata NOT DETECTED NOT DETECTED Final   Candida krusei NOT DETECTED NOT DETECTED Final   Candida parapsilosis NOT DETECTED NOT DETECTED  Final   Candida tropicalis NOT DETECTED NOT DETECTED Final   Cryptococcus neoformans/gattii NOT DETECTED NOT DETECTED Final   CTX-M ESBL NOT DETECTED NOT DETECTED Final   Carbapenem resistance IMP NOT DETECTED NOT DETECTED Final   Carbapenem resistance KPC NOT DETECTED NOT DETECTED Final   Carbapenem resistance NDM NOT DETECTED NOT DETECTED Final   Carbapenem resist OXA 48 LIKE NOT DETECTED NOT DETECTED Final   Carbapenem resistance VIM NOT DETECTED NOT DETECTED Final    Comment: Performed at [facility], [address], [place], [place] [address]  Resp panel by RT-PCR (RSV, Flu A&B, Covid) Anterior Nasal Swab     Status: None   Collection Time: 08/06/23  5:53 PM   Specimen: Anterior Nasal Swab  Result Value Ref Range Status   SARS Coronavirus 2 by RT PCR NEGATIVE NEGATIVE Final   Influenza A by PCR NEGATIVE NEGATIVE Final   Influenza B by PCR NEGATIVE NEGATIVE Final    Comment: (NOTE) The Xpert Xpress SARS-CoV-2/FLU/RSV plus assay is intended as an aid in the diagnosis of influenza from Nasopharyngeal swab specimens and should not be used as a sole basis for treatment. Nasal washings and aspirates are unacceptable for Xpert Xpress SARS-CoV-2/FLU/RSV testing.  Fact Sheet for Patients: BloggerCourse.com  Fact Sheet for Healthcare Providers: SeriousBroker.it  This test is not yet approved or cleared by the [place]  FDA and has been authorized for detection and/or diagnosis of SARS-CoV-2 by FDA under an Emergency  Use Authorization (EUA). This EUA will remain in effect (meaning this test can be used) for the duration of the COVID-19 declaration under Section 564(b)(1) of the Act, 21 U.S.C. section 360bbb-3(b)(1), unless the authorization is terminated or revoked.     Resp Syncytial Virus by PCR NEGATIVE NEGATIVE Final    Comment: (NOTE) Fact Sheet for Patients: BloggerCourse.com  Fact Sheet  for Healthcare Providers: SeriousBroker.it  This test is not yet approved or cleared by the [place]  FDA and has been authorized for detection and/or diagnosis of SARS-CoV-2 by FDA under an Emergency Use Authorization (EUA). This EUA will remain in effect (meaning this test can be used) for the duration of the COVID-19 declaration under Section 564(b)(1) of the Act, 21 U.S.C. section 360bbb-3(b)(1), unless the authorization is terminated or revoked.  Performed at [facility], [address] [place], [place] [address]   Culture, blood (Routine X 2) w Reflex to ID Panel     Status: None (Preliminary result)   Collection Time: 08/10/23 11:53 AM   Specimen: BLOOD RIGHT ARM  Result Value Ref Range Status   Specimen Description BLOOD RIGHT ARM  Final   Special Requests   Final    BOTTLES DRAWN AEROBIC ONLY Blood Culture results may not be optimal due to an inadequate volume of blood received in culture bottles   Culture   Final    NO GROWTH 2 DAYS Performed at [facility], [address] [place], [place] [address]    Report Status PENDING  Incomplete  Culture, blood (Routine X 2) w Reflex to ID Panel     Status: None (Preliminary result)   Collection Time: 08/10/23 11:53 AM   Specimen: BLOOD RIGHT HAND  Result Value Ref Range Status   Specimen Description BLOOD RIGHT HAND  Final   Special Requests   Final    BOTTLES DRAWN AEROBIC ONLY Blood Culture results may not be optimal due to an inadequate volume of blood received in culture bottles   Culture   Final    NO GROWTH 2 DAYS Performed at [facility], [address] [place], [place] [address]    Report Status PENDING  Incomplete         Radiology Studies: ECHOCARDIOGRAM COMPLETE Result Date: 08/11/2023    ECHOCARDIOGRAM REPORT   Patient Name:   DOMINYC PEARSON Strom Date of Exam: 08/11/2023 Medical Rec #:  [id number]        Height:       72.0 in Accession #:    [id number]        Weight:       202.2 lb Date of Birth:  1945/12/19        BSA:          2.140 m Patient Age:    78 years         BP:           129/70 mmHg Patient Gender: M                HR:           74 bpm. Exam Location:  Inpatient Procedure: 2D Echo, Cardiac Doppler and Color Doppler (Both Spectral and Color            Flow Doppler were utilized during procedure). Indications:    122-I22.9 Subsequent ST elevation (STEM) and non-ST elevation                 (NSTEMI) myocardial  infarction  History:        Patient has prior history of Echocardiogram examinations, most                 recent 05/10/2023.  Sonographer:    Andrena Bang Referring Phys: 9 MUHAMMAD A ARIDA IMPRESSIONS  1. Left ventricular ejection fraction, by estimation, is 30 to 35%. The left ventricle has moderately decreased function. The left ventricle demonstrates regional wall motion abnormalities with mid to apical anteroseptal and inferoseptal akinesis and akinesis of the apical inferior/anterior/lateral walls. Akinesis of the true apex. Findings suggest LAD territory infarction. No LV thrombus. There is mild concentric left ventricular hypertrophy. Left ventricular diastolic parameters are consistent with  Grade I diastolic dysfunction (impaired relaxation).  2. Right ventricular systolic function is normal. The right ventricular size is normal. There is mildly elevated pulmonary artery systolic pressure. The estimated right ventricular systolic pressure is 41.9 mmHg.  3. The aortic valve is tricuspid. There is mild calcification of the aortic valve. Aortic valve regurgitation is mild. No aortic stenosis is present.  4. The inferior vena cava is dilated in size with >50% respiratory variability, suggesting right atrial pressure of 8 mmHg.  5. The mitral valve is normal in structure. No evidence of mitral valve regurgitation. No evidence of mitral stenosis. FINDINGS  Left Ventricle: Left ventricular ejection fraction, by estimation, is 30 to 35%. The left  ventricle has moderately decreased function. The left ventricle demonstrates regional wall motion abnormalities. Definity  contrast agent was given IV to delineate the left ventricular endocardial borders. The left ventricular internal cavity size was normal in size. There is mild concentric left ventricular hypertrophy. Left ventricular diastolic parameters are consistent with Grade I diastolic dysfunction (impaired relaxation). Right Ventricle: The right ventricular size is normal. No increase in right ventricular wall thickness. Right ventricular systolic function is normal. There is mildly elevated pulmonary artery systolic pressure. The tricuspid regurgitant velocity is 2.91  m/s, and with an assumed right atrial pressure of 8 mmHg, the estimated right ventricular systolic pressure is 41.9 mmHg. Left Atrium: Left atrial size was normal in size. Right Atrium: Right atrial size was normal in size. Pericardium: There is no evidence of pericardial effusion. Mitral Valve: The mitral valve is normal in structure. No evidence of mitral valve regurgitation. No evidence of mitral valve stenosis. Tricuspid Valve: The tricuspid valve is normal in structure. Tricuspid valve regurgitation is trivial. Aortic Valve: The aortic valve is tricuspid. There is mild calcification of the aortic valve. Aortic valve regurgitation is mild. No aortic stenosis is present. Aortic valve mean gradient measures 3.0 mmHg. Aortic valve peak gradient measures 6.5 mmHg. Aortic valve area, by VTI measures 2.34 cm. Pulmonic Valve: The pulmonic valve was normal in structure. Pulmonic valve regurgitation is trivial. Aorta: The aortic root is normal in size and structure. Venous: The inferior vena cava is dilated in size with greater than 50% respiratory variability, suggesting right atrial pressure of 8 mmHg. IAS/Shunts: No atrial level shunt detected by color flow Doppler. Additional Comments: A device lead is visualized in the right ventricle.   LEFT VENTRICLE PLAX 2D LVIDd:         5.20 cm      Diastology LVIDs:         3.70 cm      LV e' medial:    4.90 cm/s LV PW:         1.50 cm      LV E/e' medial:  15.3 LV IVS:  1.20 cm      LV e' lateral:   5.00 cm/s LVOT diam:     2.10 cm      LV E/e' lateral: 15.0 LV SV:         62 LV SV Index:   29 LVOT Area:     3.46 cm  LV Volumes (MOD) LV vol d, MOD A2C: 152.0 ml LV vol d, MOD A4C: 154.0 ml LV vol s, MOD A2C: 88.5 ml LV vol s, MOD A4C: 111.0 ml LV SV MOD A2C:     63.5 ml LV SV MOD A4C:     154.0 ml LV SV MOD BP:      55.3 ml RIGHT VENTRICLE RV S prime:     11.65 cm/s TAPSE (M-mode): 2.0 cm LEFT ATRIUM             Index LA diam:        4.10 cm 1.92 cm/m LA Vol (A2C):   45.8 ml 21.40 ml/m LA Vol (A4C):   45.4 ml 21.21 ml/m LA Biplane Vol: 48.4 ml 22.61 ml/m  AORTIC VALVE AV Area (Vmax):    2.24 cm AV Area (Vmean):   2.06 cm AV Area (VTI):     2.34 cm AV Vmax:           127.00 cm/s AV Vmean:          85.400 cm/s AV VTI:            0.264 m AV Peak Grad:      6.5 mmHg AV Mean Grad:      3.0 mmHg LVOT Vmax:         82.20 cm/s LVOT Vmean:        50.800 cm/s LVOT VTI:          0.178 m LVOT/AV VTI ratio: 0.67  AORTA Ao Asc diam: 3.20 cm MITRAL VALVE                TRICUSPID VALVE MV Area (PHT): 3.81 cm     TR Peak grad:   33.9 mmHg MV Decel Time: 199 msec     TR Vmax:        291.00 cm/s MV E velocity: 74.90 cm/s MV A velocity: 108.00 cm/s  SHUNTS MV E/A ratio:  0.69         Systemic VTI:  0.18 m                             Systemic Diam: 2.10 cm Dalton McleanMD Electronically signed by Archer Bear Signature Date/Time: 08/11/2023/10:48:36 AM    Final    CARDIAC CATHETERIZATION Addendum Date: 08/10/2023   3rd Mrg lesion is 70% stenosed.   Prox RCA lesion is 95% stenosed.   1st LPL lesion is 30% stenosed.   LPDA lesion is 80% stenosed.   Mid LAD-1 lesion is 95% stenosed.   Mid LAD-2 lesion is 50% stenosed.   Dist LAD lesion is 40% stenosed.   Prox LAD to Mid LAD lesion is 40% stenosed.   Lat 1st Diag  lesion is 80% stenosed.   A drug-eluting stent was successfully placed using a STENT SYNERGY XD 2.50X28.   Post intervention, there is a 0% residual stenosis.   Post intervention, there is a 0% residual stenosis.   LV end diastolic pressure is mildly elevated. 1.  Left dominant coronary arteries with significant three-vessel coronary artery disease.  The culprit is severe stenosis  in the mid LAD.  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 2.  Left ventricular angiography was not diagnostic.  Will obtain an echocardiogram.  LVEDP was mildly to moderately elevated at 20 mmHg. 3.  Successful angioplasty and drug-eluting stent placement to the mid LAD. Recommendations: Will treat with aspirin  and clopidogrel  for now.  Heparin  can be resumed 2 hours after TR band removal given that he is on anticoagulation for pulmonary embolism.  Aspirin  can be stopped once the patient is started on a DOAC.  Clopidogrel  should be continued for at least 6 months. The rest of small vessel disease should be managed medically. Unfortunately, his cholecystectomy will need to be postponed.  Result Date: 08/10/2023   3rd Mrg lesion is 70% stenosed.   Prox RCA lesion is 95% stenosed.   1st LPL lesion is 30% stenosed.   LPDA lesion is 80% stenosed.   Mid LAD-1 lesion is 95% stenosed.   Mid LAD-2 lesion is 50% stenosed.   Dist LAD lesion is 40% stenosed.   Prox LAD to Mid LAD lesion is 40% stenosed.   Lat 1st Diag lesion is 80% stenosed.   A drug-eluting stent was successfully placed using a STENT SYNERGY XD 2.50X28.   Post intervention, there is a 0% residual stenosis.   Post intervention, there is a 0% residual stenosis.   LV end diastolic pressure is mildly elevated. 1.  Left dominant coronary arteries with significant three-vessel coronary artery disease.  The culprit is severe stenosis in the mid LAD.  There is extensive small vessel coronary artery disease  and significant stenosis in the nondominant right coronary artery. 2.  Left ventricular angiography was not diagnostic.  Will obtain an echocardiogram.  LVEDP was mildly to moderately elevated at 20 mmHg. 3.  Successful angioplasty and drug-eluting stent placement to the mid LAD. Recommendations: Will treat with aspirin  and clopidogrel  for now.  Heparin  can be resumed 2 hours after TR band removal given that he is on anticoagulation for pulmonary embolism.  Aspirin  can be stopped once the patient is started on a DOAC.  Clopidogrel  should be continued for at least 6 months. The rest of small vessel disease should be managed medically.        Scheduled Meds:  apixaban   5 mg Oral BID   atorvastatin   80 mg Oral QHS   carvedilol   3.125 mg Oral BID WC   Chlorhexidine  Gluconate Cloth  6 each Topical Daily   clopidogrel   75 mg Oral Q breakfast   vitamin B-12  1,000 mcg Oral Daily   feeding supplement  1 Container Oral TID BM   ferrous sulfate   325 mg Oral Q breakfast   finasteride   5 mg Oral Daily   Gerhardt's butt cream   Topical BID   insulin  aspart  0-9 Units Subcutaneous TID AC & HS   multivitamin with minerals  1 tablet Oral Daily   nystatin    Topical TID   pantoprazole   40 mg Oral QHS   sodium chloride  flush  3 mL Intravenous Q12H   spironolactone   12.5 mg Oral Daily   tamsulosin   0.4 mg Oral Daily   thiamine   100 mg Oral Daily   Continuous Infusions:   ceFAZolin  (ANCEF ) IV 2 g (08/12/23 0616)     LOS: 6 days    Time spent: 35 Minutes    Thereasa Iannello A Deleah Tison, MD [facility]   If 7PM-7AM, please contact night-coverage [email or web address]  08/12/2023, 3:12 PM

## 2023-08-12 NOTE — Progress Notes (Signed)
 Inpatient Rehabilitation Admissions Coordinator   I have OT update and will begin Auth with [facility] Medicare for possible CIR admit pending their approval.  Jeannetta Millman, RN, MSN Rehab Admissions Coordinator [phone] 08/12/2023 9:42 AM

## 2023-08-12 NOTE — Care Management Important Message (Signed)
 Important Message  Patient Details  Name: John [name] MRN: [id number] Date of Birth: 02-05-1946   Important Message Given:  Yes - Medicare IM     [name] 08/12/2023, 10:21 AM

## 2023-08-13 ENCOUNTER — Inpatient Hospital Stay (HOSPITAL_COMMUNITY)
Admission: RE | Admit: 2023-08-13 | Discharge: 2023-08-19 | DRG: 945 | Disposition: A | Discharge status: Home Health | Source: Intra-hospital | Attending: Physical Medicine and Rehabilitation | Admitting: Physical Medicine and Rehabilitation

## 2023-08-13 ENCOUNTER — Encounter (HOSPITAL_COMMUNITY): Payer: Self-pay | Admitting: Physical Medicine and Rehabilitation

## 2023-08-13 DIAGNOSIS — K219 Gastro-esophageal reflux disease without esophagitis: Secondary | ICD-10-CM | POA: Diagnosis present

## 2023-08-13 DIAGNOSIS — I951 Orthostatic hypotension: Secondary | ICD-10-CM | POA: Diagnosis not present

## 2023-08-13 DIAGNOSIS — Z66 Do not resuscitate: Secondary | ICD-10-CM | POA: Diagnosis present

## 2023-08-13 DIAGNOSIS — D649 Anemia, unspecified: Secondary | ICD-10-CM | POA: Diagnosis present

## 2023-08-13 DIAGNOSIS — Z853 Personal history of malignant neoplasm of breast: Secondary | ICD-10-CM | POA: Diagnosis not present

## 2023-08-13 DIAGNOSIS — I25810 Atherosclerosis of coronary artery bypass graft(s) without angina pectoris: Secondary | ICD-10-CM | POA: Diagnosis not present

## 2023-08-13 DIAGNOSIS — N138 Other obstructive and reflux uropathy: Secondary | ICD-10-CM | POA: Diagnosis present

## 2023-08-13 DIAGNOSIS — Z79899 Other long term (current) drug therapy: Secondary | ICD-10-CM

## 2023-08-13 DIAGNOSIS — I10 Essential (primary) hypertension: Secondary | ICD-10-CM | POA: Diagnosis present

## 2023-08-13 DIAGNOSIS — N320 Bladder-neck obstruction: Secondary | ICD-10-CM | POA: Insufficient documentation

## 2023-08-13 DIAGNOSIS — N400 Enlarged prostate without lower urinary tract symptoms: Secondary | ICD-10-CM | POA: Diagnosis not present

## 2023-08-13 DIAGNOSIS — K5900 Constipation, unspecified: Secondary | ICD-10-CM | POA: Diagnosis present

## 2023-08-13 DIAGNOSIS — K8011 Calculus of gallbladder with chronic cholecystitis with obstruction: Secondary | ICD-10-CM

## 2023-08-13 DIAGNOSIS — I69354 Hemiplegia and hemiparesis following cerebral infarction affecting left non-dominant side: Secondary | ICD-10-CM

## 2023-08-13 DIAGNOSIS — Z888 Allergy status to other drugs, medicaments and biological substances status: Secondary | ICD-10-CM

## 2023-08-13 DIAGNOSIS — I82519 Chronic embolism and thrombosis of unspecified femoral vein: Secondary | ICD-10-CM | POA: Insufficient documentation

## 2023-08-13 DIAGNOSIS — I5022 Chronic systolic (congestive) heart failure: Secondary | ICD-10-CM | POA: Diagnosis present

## 2023-08-13 DIAGNOSIS — I950 Idiopathic hypotension: Secondary | ICD-10-CM | POA: Diagnosis not present

## 2023-08-13 DIAGNOSIS — L89151 Pressure ulcer of sacral region, stage 1: Secondary | ICD-10-CM | POA: Diagnosis present

## 2023-08-13 DIAGNOSIS — I255 Ischemic cardiomyopathy: Secondary | ICD-10-CM | POA: Diagnosis present

## 2023-08-13 DIAGNOSIS — Z86718 Personal history of other venous thrombosis and embolism: Secondary | ICD-10-CM | POA: Diagnosis not present

## 2023-08-13 DIAGNOSIS — Z7901 Long term (current) use of anticoagulants: Secondary | ICD-10-CM

## 2023-08-13 DIAGNOSIS — Z91048 Other nonmedicinal substance allergy status: Secondary | ICD-10-CM

## 2023-08-13 DIAGNOSIS — A419 Sepsis, unspecified organism: Secondary | ICD-10-CM | POA: Diagnosis not present

## 2023-08-13 DIAGNOSIS — Z7984 Long term (current) use of oral hypoglycemic drugs: Secondary | ICD-10-CM | POA: Diagnosis not present

## 2023-08-13 DIAGNOSIS — I5020 Unspecified systolic (congestive) heart failure: Secondary | ICD-10-CM | POA: Diagnosis not present

## 2023-08-13 DIAGNOSIS — R6520 Severe sepsis without septic shock: Secondary | ICD-10-CM | POA: Diagnosis not present

## 2023-08-13 DIAGNOSIS — I214 Non-ST elevation (NSTEMI) myocardial infarction: Secondary | ICD-10-CM | POA: Diagnosis present

## 2023-08-13 DIAGNOSIS — Z823 Family history of stroke: Secondary | ICD-10-CM

## 2023-08-13 DIAGNOSIS — Z807 Family history of other malignant neoplasms of lymphoid, hematopoietic and related tissues: Secondary | ICD-10-CM

## 2023-08-13 DIAGNOSIS — Z833 Family history of diabetes mellitus: Secondary | ICD-10-CM | POA: Diagnosis not present

## 2023-08-13 DIAGNOSIS — M1612 Unilateral primary osteoarthritis, left hip: Secondary | ICD-10-CM | POA: Diagnosis present

## 2023-08-13 DIAGNOSIS — N390 Urinary tract infection, site not specified: Secondary | ICD-10-CM

## 2023-08-13 DIAGNOSIS — R5381 Other malaise: Principal | ICD-10-CM | POA: Diagnosis present

## 2023-08-13 DIAGNOSIS — I9589 Other hypotension: Secondary | ICD-10-CM | POA: Diagnosis not present

## 2023-08-13 DIAGNOSIS — E1142 Type 2 diabetes mellitus with diabetic polyneuropathy: Secondary | ICD-10-CM | POA: Diagnosis present

## 2023-08-13 DIAGNOSIS — L89159 Pressure ulcer of sacral region, unspecified stage: Secondary | ICD-10-CM | POA: Diagnosis present

## 2023-08-13 DIAGNOSIS — I110 Hypertensive heart disease with heart failure: Secondary | ICD-10-CM | POA: Diagnosis present

## 2023-08-13 DIAGNOSIS — E7800 Pure hypercholesterolemia, unspecified: Secondary | ICD-10-CM | POA: Diagnosis present

## 2023-08-13 DIAGNOSIS — A499 Bacterial infection, unspecified: Secondary | ICD-10-CM

## 2023-08-13 DIAGNOSIS — A4151 Sepsis due to Escherichia coli [E. coli]: Secondary | ICD-10-CM | POA: Insufficient documentation

## 2023-08-13 DIAGNOSIS — Z515 Encounter for palliative care: Secondary | ICD-10-CM | POA: Diagnosis not present

## 2023-08-13 DIAGNOSIS — K8020 Calculus of gallbladder without cholecystitis without obstruction: Secondary | ICD-10-CM | POA: Diagnosis present

## 2023-08-13 DIAGNOSIS — Z955 Presence of coronary angioplasty implant and graft: Secondary | ICD-10-CM

## 2023-08-13 DIAGNOSIS — I2510 Atherosclerotic heart disease of native coronary artery without angina pectoris: Secondary | ICD-10-CM | POA: Diagnosis present

## 2023-08-13 DIAGNOSIS — N401 Enlarged prostate with lower urinary tract symptoms: Secondary | ICD-10-CM | POA: Diagnosis present

## 2023-08-13 DIAGNOSIS — E440 Moderate protein-calorie malnutrition: Secondary | ICD-10-CM | POA: Diagnosis not present

## 2023-08-13 DIAGNOSIS — Z8041 Family history of malignant neoplasm of ovary: Secondary | ICD-10-CM

## 2023-08-13 DIAGNOSIS — Z86711 Personal history of pulmonary embolism: Secondary | ICD-10-CM

## 2023-08-13 DIAGNOSIS — K8010 Calculus of gallbladder with chronic cholecystitis without obstruction: Secondary | ICD-10-CM | POA: Diagnosis present

## 2023-08-13 DIAGNOSIS — N3289 Other specified disorders of bladder: Secondary | ICD-10-CM | POA: Diagnosis present

## 2023-08-13 DIAGNOSIS — Z978 Presence of other specified devices: Secondary | ICD-10-CM

## 2023-08-13 DIAGNOSIS — Z7902 Long term (current) use of antithrombotics/antiplatelets: Secondary | ICD-10-CM

## 2023-08-13 DIAGNOSIS — I959 Hypotension, unspecified: Secondary | ICD-10-CM | POA: Diagnosis not present

## 2023-08-13 DIAGNOSIS — Z950 Presence of cardiac pacemaker: Secondary | ICD-10-CM

## 2023-08-13 DIAGNOSIS — M25552 Pain in left hip: Secondary | ICD-10-CM | POA: Diagnosis not present

## 2023-08-13 DIAGNOSIS — R41844 Frontal lobe and executive function deficit: Secondary | ICD-10-CM | POA: Diagnosis not present

## 2023-08-13 LAB — COMPREHENSIVE METABOLIC PANEL WITH GFR
ALT: 15 U/L (ref 0–44)
AST: 25 U/L (ref 15–41)
Albumin: 2.8 g/dL — ABNORMAL LOW (ref 3.5–5.0)
Alkaline Phosphatase: 63 U/L (ref 38–126)
Anion gap: 9 (ref 5–15)
BUN: 5 mg/dL — ABNORMAL LOW (ref 8–23)
CO2: 27 mmol/L (ref 22–32)
Calcium: 9.2 mg/dL (ref 8.9–10.3)
Chloride: 103 mmol/L (ref 98–111)
Creatinine, Ser: 0.88 mg/dL (ref 0.61–1.24)
GFR, Estimated: >60 mL/min (ref >60)
Glucose, Bld: 207 mg/dL — ABNORMAL HIGH (ref 70–99)
Potassium: 3.6 mmol/L (ref 3.5–5.1)
Sodium: 139 mmol/L (ref 135–145)
Total Bilirubin: 1.4 mg/dL — ABNORMAL HIGH (ref 0.0–1.2)
Total Protein: 5.8 g/dL — ABNORMAL LOW (ref 6.5–8.1)

## 2023-08-13 LAB — GLUCOSE, CAPILLARY
Glucose-Capillary: 172 mg/dL — ABNORMAL HIGH (ref 70–99)
Glucose-Capillary: 303 mg/dL — ABNORMAL HIGH (ref 70–99)
Glucose-Capillary: 301 mg/dL — ABNORMAL HIGH (ref 70–99)
Glucose-Capillary: 333 mg/dL — ABNORMAL HIGH (ref 70–99)
Glucose-Capillary: 288 mg/dL — ABNORMAL HIGH (ref 70–99)

## 2023-08-13 MED ORDER — ADULT MULTIVITAMIN W/MINERALS CH
1.0000 | ORAL_TABLET | Freq: Every day | ORAL | Status: DC
  Administered 2023-08-14: 1 via ORAL
  Filled 2023-08-13 (×2): qty 1

## 2023-08-13 MED ORDER — POLYETHYLENE GLYCOL 3350 17 G PO PACK
17.0000 g | PACK | Freq: Every day | ORAL | Status: DC | PRN
  Administered 2023-08-17: 17 g via ORAL
  Filled 2023-08-13: qty 1

## 2023-08-13 MED ORDER — NYSTATIN 100000 UNIT/GM EX POWD
Freq: Three times a day (TID) | CUTANEOUS | Status: DC
  Filled 2023-08-13: qty 15

## 2023-08-13 MED ORDER — AMOXICILLIN-POT CLAVULANATE 875-125 MG PO TABS
1.0000 | ORAL_TABLET | Freq: Two times a day (BID) | ORAL | Status: DC
Start: 2023-08-14 — End: 2023-08-19
  Administered 2023-08-14 – 2023-08-19 (×9): 1 via ORAL
  Filled 2023-08-13 (×11): qty 1

## 2023-08-13 MED ORDER — INSULIN ASPART 100 UNIT/ML IJ SOLN
0.0000 [IU] | Freq: Three times a day (TID) | INTRAMUSCULAR | Status: DC
  Administered 2023-08-13: 7 [IU] via SUBCUTANEOUS
  Administered 2023-08-14: 5 [IU] via SUBCUTANEOUS
  Administered 2023-08-14: 2 [IU] via SUBCUTANEOUS
  Administered 2023-08-14: 7 [IU] via SUBCUTANEOUS
  Administered 2023-08-14: 3 [IU] via SUBCUTANEOUS
  Administered 2023-08-15 (×2): 5 [IU] via SUBCUTANEOUS
  Administered 2023-08-15: 9 [IU] via SUBCUTANEOUS
  Administered 2023-08-15: 2 [IU] via SUBCUTANEOUS
  Administered 2023-08-16: 5 [IU] via SUBCUTANEOUS
  Administered 2023-08-16: 2 [IU] via SUBCUTANEOUS
  Administered 2023-08-16: 3 [IU] via SUBCUTANEOUS
  Administered 2023-08-17: 7 [IU] via SUBCUTANEOUS
  Administered 2023-08-17 (×3): 2 [IU] via SUBCUTANEOUS
  Administered 2023-08-18: 3 [IU] via SUBCUTANEOUS
  Administered 2023-08-18: 1 [IU] via SUBCUTANEOUS
  Administered 2023-08-18: 2 [IU] via SUBCUTANEOUS
  Administered 2023-08-18: 5 [IU] via SUBCUTANEOUS
  Administered 2023-08-19: 2 [IU] via SUBCUTANEOUS

## 2023-08-13 MED ORDER — VITAMIN B-12 1000 MCG PO TABS
1000.0000 ug | ORAL_TABLET | Freq: Every day | ORAL | Status: DC
  Administered 2023-08-14 – 2023-08-19 (×4): 1000 ug via ORAL
  Filled 2023-08-13 (×5): qty 1

## 2023-08-13 MED ORDER — CLOPIDOGREL BISULFATE 75 MG PO TABS: 75.0000 mg | ORAL_TABLET | Freq: Every day | ORAL | 1 refills | Status: DC

## 2023-08-13 MED ORDER — FERROUS SULFATE 325 (65 FE) MG PO TABS: 325.0000 mg | ORAL_TABLET | Freq: Every day | ORAL | 1 refills | Status: DC

## 2023-08-13 MED ORDER — SORBITOL 70 % SOLN
30.0000 mL | Freq: Every day | Status: DC | PRN
  Administered 2023-08-17: 30 mL via ORAL
  Filled 2023-08-13: qty 30

## 2023-08-13 MED ORDER — ACETAMINOPHEN 650 MG RE SUPP: 650.0000 mg | Freq: Four times a day (QID) | RECTAL | Status: DC | PRN

## 2023-08-13 MED ORDER — CARVEDILOL 3.125 MG PO TABS: 3.1250 mg | ORAL_TABLET | Freq: Two times a day (BID) | ORAL | 1 refills | Status: DC

## 2023-08-13 MED ORDER — APIXABAN 5 MG PO TABS
5.0000 mg | ORAL_TABLET | Freq: Two times a day (BID) | ORAL | Status: DC
  Administered 2023-08-13 – 2023-08-19 (×12): 5 mg via ORAL
  Filled 2023-08-13 (×12): qty 1

## 2023-08-13 MED ORDER — FERROUS SULFATE 325 (65 FE) MG PO TABS
325.0000 mg | ORAL_TABLET | Freq: Every day | ORAL | Status: DC
  Administered 2023-08-14: 325 mg via ORAL
  Filled 2023-08-13 (×2): qty 1

## 2023-08-13 MED ORDER — BOOST / RESOURCE BREEZE PO LIQD CUSTOM
1.0000 | Freq: Three times a day (TID) | ORAL | Status: DC
  Administered 2023-08-13 – 2023-08-14 (×2): 1 via ORAL

## 2023-08-13 MED ORDER — FINASTERIDE 5 MG PO TABS
5.0000 mg | ORAL_TABLET | Freq: Every day | ORAL | Status: DC
  Administered 2023-08-14 – 2023-08-19 (×6): 5 mg via ORAL
  Filled 2023-08-13 (×6): qty 1

## 2023-08-13 MED ORDER — ONDANSETRON HCL 4 MG PO TABS: 4.0000 mg | ORAL_TABLET | Freq: Four times a day (QID) | ORAL | Status: DC | PRN

## 2023-08-13 MED ORDER — CARVEDILOL 3.125 MG PO TABS
3.1250 mg | ORAL_TABLET | Freq: Two times a day (BID) | ORAL | Status: DC
  Administered 2023-08-13 – 2023-08-14 (×3): 3.125 mg via ORAL
  Filled 2023-08-13 (×4): qty 1

## 2023-08-13 MED ORDER — CEPHALEXIN 500 MG PO CAPS: 500.0000 mg | ORAL_CAPSULE | Freq: Three times a day (TID) | ORAL | 0 refills | Status: DC

## 2023-08-13 MED ORDER — BISACODYL 10 MG RE SUPP
10.0000 mg | Freq: Every day | RECTAL | Status: DC | PRN
  Administered 2023-08-17: 10 mg via RECTAL
  Filled 2023-08-13: qty 1

## 2023-08-13 MED ORDER — AMOXICILLIN-POT CLAVULANATE 875-125 MG PO TABS: 1.0000 | ORAL_TABLET | Freq: Two times a day (BID) | ORAL | 0 refills | Status: DC

## 2023-08-13 MED ORDER — INSULIN ASPART 100 UNIT/ML IJ SOLN: 0.0000 [IU] | Freq: Three times a day (TID) | INTRAMUSCULAR | Status: DC

## 2023-08-13 MED ORDER — TRAZODONE HCL 50 MG PO TABS
50.0000 mg | ORAL_TABLET | Freq: Every evening | ORAL | Status: DC | PRN
  Administered 2023-08-13 – 2023-08-18 (×5): 50 mg via ORAL
  Filled 2023-08-13 (×5): qty 1

## 2023-08-13 MED ORDER — CEFAZOLIN SODIUM-DEXTROSE 2-4 GM/100ML-% IV SOLN
2.0000 g | Freq: Three times a day (TID) | INTRAVENOUS | Status: AC
Start: 2023-08-13 — End: 2023-08-14
  Administered 2023-08-13: 2 g via INTRAVENOUS
  Filled 2023-08-13: qty 100

## 2023-08-13 MED ORDER — THIAMINE MONONITRATE 100 MG PO TABS
100.0000 mg | ORAL_TABLET | Freq: Every day | ORAL | Status: AC
  Administered 2023-08-14: 100 mg via ORAL
  Filled 2023-08-13: qty 1

## 2023-08-13 MED ORDER — SENNOSIDES-DOCUSATE SODIUM 8.6-50 MG PO TABS
2.0000 | ORAL_TABLET | Freq: Every day | ORAL | Status: DC
  Administered 2023-08-15 – 2023-08-17 (×2): 2 via ORAL
  Filled 2023-08-13 (×5): qty 2

## 2023-08-13 MED ORDER — HYOSCYAMINE SULFATE 0.125 MG PO TBDP
0.1250 mg | ORAL_TABLET | ORAL | Status: DC | PRN
  Administered 2023-08-15: 0.125 mg via SUBLINGUAL
  Filled 2023-08-13 (×2): qty 1

## 2023-08-13 MED ORDER — SPIRONOLACTONE 25 MG PO TABS: 12.5000 mg | ORAL_TABLET | Freq: Every day | ORAL | 1 refills | Status: DC

## 2023-08-13 MED ORDER — ATORVASTATIN CALCIUM 80 MG PO TABS
80.0000 mg | ORAL_TABLET | Freq: Every day | ORAL | Status: DC
  Administered 2023-08-13 – 2023-08-18 (×6): 80 mg via ORAL
  Filled 2023-08-13 (×6): qty 1

## 2023-08-13 MED ORDER — BENZONATATE 100 MG PO CAPS: 100.0000 mg | ORAL_CAPSULE | Freq: Three times a day (TID) | ORAL | Status: DC | PRN

## 2023-08-13 MED ORDER — PANTOPRAZOLE SODIUM 40 MG PO TBEC
40.0000 mg | DELAYED_RELEASE_TABLET | Freq: Every day | ORAL | Status: DC
  Administered 2023-08-13 – 2023-08-18 (×5): 40 mg via ORAL
  Filled 2023-08-13 (×6): qty 1

## 2023-08-13 MED ORDER — TAMSULOSIN HCL 0.4 MG PO CAPS
0.4000 mg | ORAL_CAPSULE | Freq: Every day | ORAL | Status: DC
  Administered 2023-08-14: 0.4 mg via ORAL
  Filled 2023-08-13 (×2): qty 1

## 2023-08-13 MED ORDER — CLOPIDOGREL BISULFATE 75 MG PO TABS
75.0000 mg | ORAL_TABLET | Freq: Every day | ORAL | Status: DC
Start: 2023-08-14 — End: 2023-08-19
  Administered 2023-08-14 – 2023-08-19 (×6): 75 mg via ORAL
  Filled 2023-08-13 (×6): qty 1

## 2023-08-13 MED ORDER — CYANOCOBALAMIN 1000 MCG PO TABS: 1000.0000 ug | ORAL_TABLET | Freq: Every day | ORAL | 1 refills | Status: DC

## 2023-08-13 MED ORDER — GERHARDT'S BUTT CREAM
TOPICAL_CREAM | Freq: Two times a day (BID) | CUTANEOUS | Status: DC
  Filled 2023-08-13: qty 60

## 2023-08-13 MED ORDER — ONDANSETRON HCL 4 MG/2ML IJ SOLN: 4.0000 mg | Freq: Four times a day (QID) | INTRAMUSCULAR | Status: DC | PRN

## 2023-08-13 MED ORDER — BACITRACIN ZINC 500 UNIT/GM EX OINT: TOPICAL_OINTMENT | CUTANEOUS | Status: DC | PRN

## 2023-08-13 MED ORDER — ACETAMINOPHEN 325 MG PO TABS
650.0000 mg | ORAL_TABLET | Freq: Four times a day (QID) | ORAL | Status: DC | PRN
  Administered 2023-08-16 – 2023-08-17 (×2): 650 mg via ORAL
  Filled 2023-08-13 (×2): qty 2

## 2023-08-13 MED ORDER — SPIRONOLACTONE 12.5 MG HALF TABLET
12.5000 mg | ORAL_TABLET | Freq: Every day | ORAL | Status: DC
  Administered 2023-08-14 – 2023-08-17 (×4): 12.5 mg via ORAL
  Filled 2023-08-13 (×4): qty 1

## 2023-08-13 NOTE — Progress Notes (Signed)
 Signed      Expand All Collapse All          Physical Medicine and Rehabilitation Consult Reason for Consult: Weakness and falls Referring Physician: Girguis     HPI: John [name] is a 78 y.o. 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  Patient was brought to the emergency room on 08/07/2023 and was found to be in severe urosepsis.  He was started on empiric antibiotics.  Urology was consulted.  He was also found to have cholelithiasis with gallbladder sludge and mild wall thickening on right upper quadrant ultrasound.  Urology elected to continue with his current Foley catheter with follow-up as an outpatient as planned.  Patient demonstrating some improvement in his cognitive function.  Patient was up with therapies yesterday and was contact-guard assist for sit to stand transfers.  He took short steps to the recliner with a rolling walker with what appears to be contact-guard assistance as well.  Patient was self-limiting with therapy during the initial session.  Patient was minimal assistance to contact-guard assistance with ADLs.  Asked the patient how he was moving prior to this weekend and he stated he walked with and sometimes without his walker.  He uses walker because it makes his wife feel better.  He lives on the first floor of a a two-level home with 3 steps to enter.  He does not climb the stairs within the house.       Home: Home Living Family/patient expects to be discharged to:: Private residence Living Arrangements: Spouse/significant other Available Help at Discharge: Family, Available 24 hours/day Type of Home: House Home Access: Stairs to enter [employer] of Steps: 3 Entrance Stairs-Rails: Right, Left, Can reach both Home Layout: Two level, Able to live on main  level with bedroom/bathroom Alternate Level Stairs-Number of Steps: 14 Alternate Level Stairs-Rails: Right, Left, Can reach both Bathroom Shower/Tub: Walk-in shower, Curtain Bathroom Toilet: Handicapped height Bathroom Accessibility: No Home Equipment: Agricultural consultant (2 wheels), BSC/3in1, Information systems manager, Retail buyer  Lives With: Spouse  Functional History: Prior Function Prior Level of Function : Needs assist, History of Falls (last six months) Physical Assist : ADLs (physical), Mobility (physical) Mobility (physical): Gait, Stairs ADLs (physical): Bathing, Dressing, IADLs Mobility Comments: Pt reports his wife makes him use a RW and if she is not present he doesn't use an AD. Pt stated he hasn't been upstairs in 3 months as his wife won't let him go up there, but also reports he "snuck upstairs" when wife wasn't there to use upstairs shower. Pt has had at least 4 falls in the last 98mo d/t LOB and feeling dizzy. Pt reports sitting in a recliner chair often. (Per chart review pt d/c from CIR 1/26 at CGA/SBA level for transfers and ambulation with RW.) ADLs Comments: Pt Independent with self-feeding and grooming. Pt reports bathing, dressing, and toileting with Supervision from wife for safety. Pt also reports use of shower chair, which he reports he dislikes but his wife "makes me use it." Pt's wife assists with IADLs. Pt is a retired Passenger transport manager. (Per chart review pt d/c from CIR 1/26 at CGA/SBA level for transfers and ambulation with RW.) Functional Status:  Mobility: Bed Mobility Overal bed mobility: Needs Assistance Bed Mobility: Supine to Sit Supine to sit: Contact guard, +2 for safety/equipment,  HOB elevated, Used rails General bed mobility comments: Pt sat up on R side of bed, bringing BLE off EOB, and pushing up to reach seated position. CGA at trunk for safety and +2 assist for line management. Transfers Overall transfer level: Needs assistance Equipment used: Rolling walker  (2 wheels) Transfers: Sit to/from Stand, Bed to chair/wheelchair/BSC Sit to Stand: Contact guard assist, +2 physical assistance, +2 safety/equipment Bed to/from chair/wheelchair/BSC transfer type:: Step pivot Step pivot transfers: Contact guard assist, +2 safety/equipment General transfer comment: Pt stood from lowest bed height. He attempted without physical assist and was unable to power up completely, but did clear his hips. VC for proper hand placement on RA and CGA x2 to power up with slightly increased time to reach erect posture. Pt transferred to recliner chair on his R by taking short slow steps, pivoting RW, and maintainnig body inside AD at all times.Good eccentric control with sitting. Ambulation/Gait General Gait Details: Deferred as pt stated "this is all I think I can do today."   ADL: ADL Overall ADL's : Needs assistance/impaired Eating/Feeding: Set up, Sitting Grooming: Set up, Sitting Upper Body Bathing: Set up, Supervision/ safety, Sitting Lower Body Bathing: Minimal assistance, Cueing for safety, Cueing for compensatory techniques, Sit to/from stand Upper Body Dressing : Contact guard assist, Sitting (assist for line management) Lower Body Dressing: Minimal assistance, Sit to/from stand, Cueing for safety, Cueing for compensatory techniques Toilet Transfer: Contact guard assist, +2 for safety/equipment, BSC/3in1, Rolling walker (2 wheels), Cueing for safety (step-pivot transfer; cues for hand placement) Toilet Transfer Details (indicate cue type and reason): simulated bed to chair Toileting- Clothing Manipulation and Hygiene: Minimal assistance, Sit to/from stand, Cueing for safety, Cueing for compensatory techniques Functional mobility during ADLs:  (deferred due to episode of asymptomatic orthostatic hypotension) General ADL Comments: Pt with mildly decreased activity tolerance   Cognition: Cognition Orientation Level: Oriented to person, Disoriented to time,  Disoriented to place, Disoriented to situation Cognition Arousal: Alert Behavior During Therapy: [facility] for tasks assessed/performed     Review of Systems  Constitutional:  Positive for fever.  HENT: Negative.    Eyes: Negative.   Respiratory: Negative.    Cardiovascular: Negative.   Gastrointestinal: Negative.   Genitourinary:  Positive for dysuria and frequency.       Foley  Musculoskeletal: Negative.   Skin: Negative.   Neurological:  Positive for focal weakness and weakness.  Psychiatric/Behavioral: Negative.          Past Medical History:  Diagnosis Date   BPH (benign prostatic hyperplasia)     CTS (carpal tunnel syndrome)     Depression     Diabetes mellitus     Essential hypertension     GERD (gastroesophageal reflux disease)     History of breast cancer     Hypercholesteremia     Neuropathy               Past Surgical History:  Procedure Laterality Date   CATARACT EXTRACTION, BILATERAL   01/18/2017   LITHOTRIPSY       MASTECTOMY Right 05/02/2018   PACEMAKER IMPLANT N/A 05/13/2023    Procedure: PACEMAKER IMPLANT;  Surgeon: Ardeen Kohler, MD;  Location: [facility] INVASIVE CV LAB;  Service: Cardiovascular;  Laterality: N/A;   TEMPORARY PACEMAKER N/A 05/11/2023    Procedure: TEMPORARY PACEMAKER;  Surgeon: [place], Peter M, MD;  Location: [facility] INVASIVE CV LAB;  Service: Cardiovascular;  Laterality: N/A;   TONSILLECTOMY AND ADENOIDECTOMY  Family History  Problem Relation Age of Onset   Lymphoma Mother     Cancer Mother     Diabetes Father     Stroke Father     Ovarian cancer Sister     Cancer Sister     Cancer Paternal Grandmother          Social History:  reports that he has never smoked. He has never used smokeless tobacco. He reports that he does not drink alcohol and does not use drugs. Allergies:  Allergies       Allergies  Allergen Reactions   Ramipril Anaphylaxis   Other Diarrhea      Severe intolerance to Chemotherapy in the past.    Sertraline Other (See Comments)      "Extreme headaches"   Adhesive [Tape] Rash            Medications Prior to Admission  Medication Sig Dispense Refill   acetaminophen  (TYLENOL ) 650 MG CR tablet Take 1 tablet (650 mg total) by mouth every 8 (eight) hours as needed for pain.       apixaban  (ELIQUIS ) 5 MG TABS tablet Take 1 tablet (5 mg total) by mouth 2 (two) times daily. 180 tablet 1   Ascorbic Acid  (VITAMIN C) 1000 MG tablet Take 1,000 mg by mouth 3 (three) times a week.       atorvastatin  (LIPITOR ) 80 MG tablet Take 1 tablet (80 mg total) by mouth at bedtime. 90 tablet 3   buPROPion  (WELLBUTRIN ) 75 MG tablet Take 1 tablet (75 mg total) by mouth 2 (two) times daily. 180 tablet 1   clotrimazole -betamethasone  (LOTRISONE ) cream Apply 1 Application topically daily. 30 g 0   empagliflozin  (JARDIANCE ) 25 MG TABS tablet Take 1 tablet (25 mg total) by mouth daily. 30 tablet 2   finasteride  (PROSCAR ) 5 MG tablet Take 1 tablet (5 mg total) by mouth daily. 30 tablet 0   metFORMIN  (GLUCOPHAGE -XR) 500 MG 24 hr tablet Take 2 tablets (1,000 mg total) by mouth 2 (two) times daily with a meal. 120 tablet 0   Multiple Vitamin (MULTIVITAMIN) tablet Take 1 tablet by mouth 2 (two) times a week.       pantoprazole  (PROTONIX ) 40 MG tablet Take 1 tablet (40 mg total) by mouth daily. (Take in place of omeprazole  while taking clopidogrel .) 90 tablet 1   polyethylene glycol (MIRALAX  / GLYCOLAX ) 17 g packet Take 17 g by mouth 2 (two) times daily. (Patient taking differently: Take 17 g by mouth 2 (two) times daily as needed for moderate constipation.) 14 each 0   promethazine  (PHENERGAN ) 25 MG tablet Take 0.5-1 tablets (12.5-25 mg total) by mouth every 8 (eight) hours as needed for nausea or vomiting. 20 tablet 0   Triamcinolone  Acetonide (TRIAMCINOLONE  0.1 % CREAM : EUCERIN) CREA Apply 1 Application topically 2 (two) times daily. 1 each 0   Blood Glucose Monitoring Suppl (ACCU-CHEK GUIDE) w/Device KIT Use daily to  check blood sugar.  DX E11.69 1 kit 0   Lancets Misc. (ACCU-CHEK SOFTCLIX LANCET DEV) KIT Use daily to check blood sugar.  DX E11.69       tamsulosin  (FLOMAX ) 0.4 MG CAPS capsule Take 1 capsule (0.4 mg total) by mouth daily. (Patient not taking: Reported on 08/07/2023) 30 capsule 0            Blood pressure 125/61, pulse 85, temperature 98.5 F (36.9 C), temperature source Oral, resp. rate 18, height 6' (1.829 m), weight 91.7 kg, SpO2 97%. Physical Exam Constitutional:  Appearance: He is obese.  HENT:     Head: Normocephalic.     Right Ear: External ear normal.     Left Ear: External ear normal.     Nose: Nose normal.     Mouth/Throat:     Mouth: Mucous membranes are moist.  Eyes:     Extraocular Movements: Extraocular movements intact.     Pupils: Pupils are equal, round, and reactive to light.  Cardiovascular:     Rate and Rhythm: Normal rate.  Pulmonary:     Effort: Pulmonary effort is normal.  Abdominal:     Palpations: Abdomen is soft.  Musculoskeletal:        General: No swelling or tenderness. Normal range of motion.     Cervical back: Normal range of motion.  Skin:    General: Skin is warm.  Neurological:     Mental Status: He is alert.     Comments: Alert and oriented x 3. Normal insight and awareness. Intact Memory. Normal language and speech. Cranial nerve exam unremarkable. MMT: RUE 5/5. LUE grossly 4 to 4+/5 with mild PD. RLE 4-5/5. LLE 4-/ to 4+/5 prox to distal. Sensory exam normal for light touch and pain in all 4 limbs. No limb ataxia or cerebellar signs. No abnormal tone appreciated.  DTR's 2+    Psychiatric:        Mood and Affect: Mood normal.        Behavior: Behavior normal.        Lab Results Last 24 Hours       Results for orders placed or performed during the hospital encounter of 08/06/23 (from the past 24 hours)  Glucose, capillary     Status: Abnormal    Collection Time: 08/07/23  4:10 PM  Result Value Ref Range    Glucose-Capillary 152  (H) 70 - 99 mg/dL  Glucose, capillary     Status: Abnormal    Collection Time: 08/07/23  8:50 PM  Result Value Ref Range    Glucose-Capillary 202 (H) 70 - 99 mg/dL  Glucose, capillary     Status: Abnormal    Collection Time: 08/07/23  9:59 PM  Result Value Ref Range    Glucose-Capillary 178 (H) 70 - 99 mg/dL  Basic metabolic panel with GFR     Status: Abnormal    Collection Time: 08/08/23  5:31 AM  Result Value Ref Range    Sodium 139 135 - 145 mmol/L    Potassium 3.6 3.5 - 5.1 mmol/L    Chloride 104 98 - 111 mmol/L    CO2 20 (L) 22 - 32 mmol/L    Glucose, Bld 148 (H) 70 - 99 mg/dL    BUN 16 8 - 23 mg/dL    Creatinine, Ser 4.40 0.61 - 1.24 mg/dL    Calcium  8.5 (L) 8.9 - 10.3 mg/dL    GFR, Estimated >10 >27 mL/min    Anion gap 15 5 - 15  Magnesium      Status: None    Collection Time: 08/08/23  5:31 AM  Result Value Ref Range    Magnesium  1.7 1.7 - 2.4 mg/dL  Glucose, capillary     Status: Abnormal    Collection Time: 08/08/23  8:07 AM  Result Value Ref Range    Glucose-Capillary 147 (H) 70 - 99 mg/dL  CBC with Differential/Platelet     Status: Abnormal    Collection Time: 08/08/23  8:22 AM  Result Value Ref Range    WBC 7.8 4.0 -  10.5 K/uL    RBC 3.32 (L) 4.22 - 5.81 MIL/uL    Hemoglobin 9.3 (L) 13.0 - 17.0 g/dL    HCT 16.1 (L) 09.6 - 52.0 %    MCV 86.7 80.0 - 100.0 fL    MCH 28.0 26.0 - 34.0 pg    MCHC 32.3 30.0 - 36.0 g/dL    RDW 04.5 (H) 40.9 - 15.5 %    Platelets 239 150 - 400 K/uL    nRBC 0.0 0.0 - 0.2 %    Neutrophils Relative % 80 %    Neutro Abs 6.2 1.7 - 7.7 K/uL    Lymphocytes Relative 9 %    Lymphs Abs 0.7 0.7 - 4.0 K/uL    Monocytes Relative 10 %    Monocytes Absolute 0.8 0.1 - 1.0 K/uL    Eosinophils Relative 0 %    Eosinophils Absolute 0.0 0.0 - 0.5 K/uL    Basophils Relative 0 %    Basophils Absolute 0.0 0.0 - 0.1 K/uL    Immature Granulocytes 1 %    Abs Immature Granulocytes 0.07 0.00 - 0.07 K/uL  Glucose, capillary     Status: Abnormal     Collection Time: 08/08/23  1:18 PM  Result Value Ref Range    Glucose-Capillary 151 (H) 70 - 99 mg/dL       Imaging Results (Last 48 hours)  NM Hepato W/EF Result Date: 08/08/2023 CLINICAL DATA:  Vomiting, concern for cholecystitis. EXAM: NUCLEAR MEDICINE HEPATOBILIARY IMAGING WITH GALLBLADDER EF TECHNIQUE: Sequential images of the abdomen were obtained out to 60 minutes following intravenous administration of radiopharmaceutical. After oral ingestion of Ensure, gallbladder ejection fraction was determined. At 60 min, normal ejection fraction is greater than 33%. RADIOPHARMACEUTICALS:  5.5 mCi Tc-74m  Choletec  IV COMPARISON:  Ultrasound August 07, 2023 FINDINGS: Prompt uptake and biliary excretion of activity by the liver is seen. Gallbladder activity is visualized, consistent with patency of cystic duct. Biliary activity passes into small bowel, consistent with patent common bile duct. Calculated gallbladder ejection fraction is 4%. (Normal gallbladder ejection fraction with Ensure is greater than 33% and less than 80%.) IMPRESSION: Reduced gallbladder ejection fraction as can be seen with chronic cholecystitis/biliary dyskinesia. Electronically Signed   By: Tama Fails M.D.   On: 08/08/2023 15:18    US  Abdomen Limited RUQ (LIVER/GB) Result Date: 08/07/2023 CLINICAL DATA:  Sepsis. EXAM: ULTRASOUND ABDOMEN LIMITED RIGHT UPPER QUADRANT COMPARISON:  CT AP from 08/06/2023 FINDINGS: Gallbladder: There are multiple stones with sludge within the gallbladder. These measure up to 1 cm. The gallbladder wall thickness measures 3.2 mm. Negative sonographic Murphy's sign. No pericholecystic fluid. Common bile duct: Diameter: 3.1 mm.  No intrahepatic bile duct dilatation. Liver: No focal lesion identified. Within normal limits in parenchymal echogenicity. Portal vein is patent on color Doppler imaging with normal direction of blood flow towards the liver. Other: None. IMPRESSION: Cholelithiasis and gallbladder  sludge. Mild gallbladder wall thickening. Negative sonographic Murphy's sign. Findings are equivocal for cholecystitis. If there is a high clinical suspicion for acute cholecystitis consider further evaluation with nuclear medicine HIDA scan. Electronically Signed   By: Kimberley Penman M.D.   On: 08/07/2023 07:50    CT ABDOMEN PELVIS W CONTRAST Result Date: 08/06/2023 CLINICAL DATA:  Abdominal pain, sepsis EXAM: CT ABDOMEN AND PELVIS WITH CONTRAST TECHNIQUE: Multidetector CT imaging of the abdomen and pelvis was performed using the standard protocol following bolus administration of intravenous contrast. RADIATION DOSE REDUCTION: This exam was performed according to the departmental dose-optimization  program which includes automated exposure control, adjustment of the mA and/or kV according to patient size and/or use of iterative reconstruction technique. CONTRAST:  75mL OMNIPAQUE  IOHEXOL  350 MG/ML SOLN COMPARISON:  08/29/2020 FINDINGS: Lower chest: Bibasilar dependent hypoventilatory changes. No acute pleural or parenchymal lung disease. Hepatobiliary: Marked gallbladder distension, with multiple calcified gallstones layering dependently. No gallbladder wall thickening or pericholecystic fluid to suggest cholecystitis. Small amount of free fluid along the dome of the liver. Liver parenchyma is unremarkable. No biliary duct dilation. Pancreas: Unremarkable. No pancreatic ductal dilatation or surrounding inflammatory changes. Spleen: Normal in size without focal abnormality. Adrenals/Urinary Tract: Kidneys enhance normally and symmetrically. No urinary tract calculi or obstructive uropathy within either kidney. Stable nodular thickening of the adrenal glands. Bladder is decompressed with a Foley catheter, limiting its evaluation. Stomach/Bowel: No bowel obstruction or ileus. Normal appendix right lower quadrant. No bowel wall thickening or inflammatory change. Vascular/Lymphatic: Aortic atherosclerosis. No enlarged  abdominal or pelvic lymph nodes. Reproductive: Prostate is unremarkable. Other: Small amount of fluid along the dome of the liver. No free intraperitoneal gas. No abdominal wall hernia. Musculoskeletal: No acute or destructive bony abnormalities. Reconstructed images demonstrate no additional findings. IMPRESSION: 1. Distended gallbladder, with multiple calcified gallstones layering dependently. No CT evidence of acute cholecystitis. 2. Trace free fluid along the dome of the liver, nonspecific. 3.  Aortic Atherosclerosis (ICD10-I70.0). Electronically Signed   By: Bobbye Burrow M.D.   On: 08/06/2023 23:20    DG Chest Port 1 View Result Date: 08/06/2023 CLINICAL DATA:  Questionable sepsis.  Evaluate abnormality. EXAM: PORTABLE CHEST 1 VIEW COMPARISON:  Two-view chest x-ray 05/14/2023 FINDINGS: The heart size is normal. Pacing wires are stable. Mild pulmonary vascular congestion is present. Linear atelectasis is present in the right midlung. The lungs are otherwise clear. The visualized soft tissues and bony thorax are unremarkable. IMPRESSION: 1. Mild pulmonary vascular congestion. 2. Linear atelectasis in the right midlung. Electronically Signed   By: Audree Leas M.D.   On: 08/06/2023 18:50    CT Head Wo Contrast Result Date: 08/06/2023 CLINICAL DATA:  Head trauma. Ground level fall last night. Patient's wife was concerned for a stroke last night. Patient woke up more confused today with vomiting. EXAM: CT HEAD WITHOUT CONTRAST TECHNIQUE: Contiguous axial images were obtained from the base of the skull through the vertex without intravenous contrast. RADIATION DOSE REDUCTION: This exam was performed according to the departmental dose-optimization program which includes automated exposure control, adjustment of the mA and/or kV according to patient size and/or use of iterative reconstruction technique. COMPARISON:  CT head without contrast 05/08/2023. MR head without contrast/26/25. FINDINGS: Brain:  Remote lacunar infarcts are again noted within the thalami bilaterally in the left caudate head. A remote lacunar infarct is Vascular: Acute dilation of the left lateral ventricle is noted. 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 No acute infarct, hemorrhage, or mass lesion is present. No significant extraaxial fluid collection is present. The brainstem and cerebellum are within normal limits. Midline structures are within normal limits. Skull: Calvarium is intact. No focal lytic or blastic lesions are present. No significant extracranial soft tissue lesion is present. Sinuses/Orbits: The paranasal sinuses and mastoid air cells are clear. Bilateral lens replacements are noted. Globes and orbits are otherwise unremarkable. IMPRESSION: 1. No acute intracranial abnormality or significant interval change. 2. 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 3. 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 Electronically  Signed   By: Audree Leas M.D.   On: 08/06/2023 18:49       Assessment/Plan: Diagnosis: 78 year old male with prior CVA with mild residual left hemiparesis now suffering from debility after urosepsis Does the need for close, 24 hr/day medical supervision in concert with the patient's rehab needs make it unreasonable for this patient to be served in a less intensive setting? Yes and Potentially Co-Morbidities requiring supervision/potential complications:  - Cholelithiasis -BPH/Bladder outlet obstruction with Foley -Infectious disease considerations -Insulin  requiring diabetes -Sacral pressure ulcer -Chronic diastolic congestive heart failure -Hypertension Due to bladder management, bowel management, safety, skin/wound care, disease management, medication administration, pain management, and patient education, does the patient require 24 hr/day rehab  nursing? Yes and Potentially Does the patient require coordinated care of a physician, rehab nurse, therapy disciplines of PT, OT to address physical and functional deficits in the context of the above medical diagnosis(es)? Yes Addressing deficits in the following areas: balance, endurance, locomotion, strength, transferring, bowel/bladder control, bathing, dressing, feeding, grooming, toileting, and psychosocial support Can the patient actively participate in an intensive therapy program of at least 3 hrs of therapy per day at least 5 days per week? Yes The potential for patient to make measurable gains while on inpatient rehab is  potentially good Anticipated functional outcomes upon discharge from inpatient rehab are modified independent  with PT, modified independent with OT, n/a with SLP. Estimated rehab length of stay to reach the above functional goals is: see below Anticipated discharge destination: Home Overall Rehab/Functional Prognosis: excellent   POST ACUTE RECOMMENDATIONS: This patient's condition is appropriate for continued rehabilitative care in the following setting:  see below Patient has agreed to participate in recommended program. Yes Note that insurance prior authorization may be required for reimbursement for recommended care.   Comment: It's hospital day two, and he has been up with therapy only once. He is actively being treated for his infection.  If he continues to display functional need for inpatient rehab beyond today, we could explore a brief inpatient rehab admit. Rehab Admissions Coordinator to follow up.       I have personally performed a face to face diagnostic evaluation of this patient. Additionally, I have examined the patient's medical record including any pertinent labs and radiographic images.     Thanks,   Rawland Caddy, MD 08/08/2023

## 2023-08-13 NOTE — Progress Notes (Addendum)
 Signed     Expand All Collapse All PMR Admission Coordinator Pre-Admission Assessment   Patient: John [name] is an 78 y.o., male MRN: [id number] DOB: 05/06/1945 Height: 6' (182.9 cm) Weight: 91.7 kg                                                                                                                                               Insurance Information HMO:     PPO: yes     PCP:      IPA:      80/20:      OTHER:  PRIMARY: BCBS medicare      Policy#: [id number]      Subscriber: patient CM Name: Cathleen Coach      Phone#: [phone]     Fax#: [phone] Pre-Cert#: [id number] approved for 7 days from  08/13/23 until 08/20/23     Employer:  Benefits:  Phone #: [phone]     Name:  Eff. Date: 04/13/23     Deduct: none      Out of Pocket Max: $5900      Life Max: none  CIR: $335 co pay per day days 1 until 5      SNF: no co pay days 1 until 20; $214 co pay per day days 21 until 60, no co pay per day days 61 until 100 Outpatient: $10 per visit     Co-Pay:  Home Health: 100%      Co-Pay:  DME: 80%     Co-Pay: 20% Providers: in network  SECONDARY: none   Financial Counselor:       Phone#:    The Data processing manager" for patients in Inpatient Rehabilitation Facilities with attached "Privacy Act Statement-Health Care Records" was provided and verbally reviewed with: Patient   Emergency Contact Information Contact Information       Name Relation Home Work Mobile    [place] Spouse [phone]   [phone]    [place], Proto     [phone]         Other Contacts   None on File      Current Medical History  Patient Admitting Diagnosis: Sepsis   History of Present Illness: John [name] is a 78 y.o. male with PMH significant for T2DM, indwelling Foley catheter, GERD, BPH, HTN, HLD, s/p pacemaker, breast cancer, DVT/PE, and CVA. admitted  to [facility] 08/06/23 for weakness, lethargy and recurrent falls. Pt presented with AMS after a ground level  fall at home. Recent Foley cath placed by urology on 07/13/23 in the setting of BPH and outflow obstruction.    Found to have E. Coli bacteremia, sepsis secondary to UTI and treated with antibiotics.   Imaging concerned for cholelithiasis, HIDA scan concerning for chronic cholecystitis or biliary akinesis.    On 08/10/23 has hypotensive episode, diaphoresis. EKG showed T wave changes, elevated troponins,  and cardiology consulted. Underwent cardiac cath 08/10/23 had found to have an MI, and had stent placement. Choly to be postponed as OP in 6 months. Started on Plavix  and ASA.    SSI for CBGS monitoring. Began B12 replacement. Bps are currently soft.    Patient's medical record from [facility] has been reviewed by the rehabilitation admission coordinator and physician.   Past Medical History      Past Medical History:  Diagnosis Date   BPH (benign prostatic hyperplasia)     CTS (carpal tunnel syndrome)     Depression     Diabetes mellitus     Essential hypertension     GERD (gastroesophageal reflux disease)     History of breast cancer     Hypercholesteremia     Neuropathy          Has the patient had major surgery during 100 days prior to admission? yes   Family History  family history includes Cancer in his mother, paternal grandmother, and sister; Diabetes in his father; Lymphoma in his mother; Ovarian cancer in his sister; Stroke in his father.   Current Medications   Current Medications    Current Facility-Administered Medications:    acetaminophen  (TYLENOL ) tablet 650 mg, 650 mg, Oral, Q6H PRN **OR** acetaminophen  (TYLENOL ) suppository 650 mg, 650 mg, Rectal, Q6H PRN, Alvenia Aus, Muhammad A, MD   apixaban  (ELIQUIS ) tablet 5 mg, 5 mg, Oral, BID, Regalado, Belkys A, MD, 5 mg at 08/13/23 0831   atorvastatin  (LIPITOR ) tablet 80 mg, 80 mg, Oral, QHS, Arida, Muhammad A, MD, 80 mg at 08/12/23 2206   bacitracin  ointment, , Topical, PRN, Ellis Guys, NP   benzonatate   (TESSALON ) capsule 100 mg, 100 mg, Oral, TID PRN, Regalado, Belkys A, MD, 100 mg at 08/12/23 1641   carvedilol  (COREG ) tablet 3.125 mg, 3.125 mg, Oral, BID WC, Crenshaw, Brian S, MD, 3.125 mg at 08/13/23 0831   ceFAZolin  (ANCEF ) IVPB 2g/100 mL premix, 2 g, Intravenous, Q8H, Arida, Muhammad A, MD, Last Rate: 200 mL/hr at 08/13/23 0542, 2 g at 08/13/23 0542   Chlorhexidine  Gluconate Cloth 2 % PADS 6 each, 6 each, Topical, Daily, Wenona Hamilton, MD, 6 each at 08/13/23 4098   clopidogrel  (PLAVIX ) tablet 75 mg, 75 mg, Oral, Q breakfast, Arida, Muhammad A, MD, 75 mg at 08/13/23 0831   cyanocobalamin  (VITAMIN B12) tablet 1,000 mcg, 1,000 mcg, Oral, Daily, Alvenia Aus, Muhammad A, MD, 1,000 mcg at 08/13/23 0831   feeding supplement (BOOST / RESOURCE BREEZE) liquid 1 Container, 1 Container, Oral, TID BM, Arida, Muhammad A, MD, 1 Container at 08/13/23 1191   ferrous sulfate  tablet 325 mg, 325 mg, Oral, Q breakfast, Arida, Muhammad A, MD, 325 mg at 08/13/23 0831   finasteride  (PROSCAR ) tablet 5 mg, 5 mg, Oral, Daily, Arida, Muhammad A, MD, 5 mg at 08/13/23 0831   Gerhardt's butt cream, , Topical, BID, Arida, Muhammad A, MD, Given at 08/13/23 4782   hyoscyamine  (ANASPAZ ) disintergrating tablet 0.125 mg, 0.125 mg, Sublingual, Q4H PRN, Arida, Muhammad A, MD, 0.125 mg at 08/11/23 2107   insulin  aspart (novoLOG ) injection 0-9 Units, 0-9 Units, Subcutaneous, TID AC & HS, Wenona Hamilton, MD, 2 Units at 08/13/23 9562   multivitamin with minerals tablet 1 tablet, 1 tablet, Oral, Daily, Antionette Kirks A, MD, 1 tablet at 08/13/23 0831   nystatin  (MYCOSTATIN /NYSTOP ) topical powder, , Topical, TID, Arida, Muhammad A, MD, Given at 08/13/23 [phone]   ondansetron  (ZOFRAN ) tablet 4 mg, 4 mg, Oral, Q6H PRN **OR**  ondansetron  (ZOFRAN ) injection 4 mg, 4 mg, Intravenous, Q6H PRN, Antionette Kirks A, MD, 4 mg at 08/10/23 1109   Oral care mouth rinse, 15 mL, Mouth Rinse, PRN, Alvenia Aus, Muhammad A, MD   pantoprazole  (PROTONIX ) EC tablet 40  mg, 40 mg, Oral, QHS, Arida, Muhammad A, MD, 40 mg at 08/12/23 2206   sodium chloride  flush (NS) 0.9 % injection 3 mL, 3 mL, Intravenous, Q12H, Arida, Muhammad A, MD, 3 mL at 08/13/23 6063   sodium chloride  flush (NS) 0.9 % injection 3 mL, 3 mL, Intravenous, PRN, Arida, Muhammad A, MD   spironolactone  (ALDACTONE ) tablet 12.5 mg, 12.5 mg, Oral, Daily, Crenshaw, Brian S, MD, 12.5 mg at 08/13/23 0831   tamsulosin  (FLOMAX ) capsule 0.4 mg, 0.4 mg, Oral, Daily, Arida, Muhammad A, MD, 0.4 mg at 08/13/23 0831   thiamine  (VITAMIN B1) tablet 100 mg, 100 mg, Oral, Daily, Arida, Muhammad A, MD, 100 mg at 08/13/23 0831   traZODone  (DESYREL ) tablet 50 mg, 50 mg, Oral, QHS PRN, Girguis, David, MD, 50 mg at 08/12/23 2207     Patients Current Diet:  Diet Order                  Diet Heart Room service appropriate? Yes; Fluid consistency: Thin  Diet effective now                       Precautions / Restrictions Precautions Precautions: Fall Precaution/Restrictions Comments: watch BP Restrictions Weight Bearing Restrictions Per Provider Order: No    Has the patient had 2 or more falls or a fall with injury in the past year? yes   Prior Activity Level Community (5-7x/wk): needed asisst with adls   Prior Functional Level Prior Function Prior Level of Function : Needs assist, History of Falls (last six months) Physical Assist : ADLs (physical), Mobility (physical) Mobility (physical): Gait, Stairs ADLs (physical): Bathing, Dressing, IADLs Mobility Comments: Pt reports his wife makes him use a RW and if she is not present he doesn't use an AD. Pt stated he hasn't been upstairs in 3 months as his wife won't let him go up there, but also reports he "snuck upstairs" when wife wasn't there to use upstairs shower. Pt has had at least 4 falls in the last 22mo d/t LOB and feeling dizzy. Pt reports sitting in a recliner chair often. (Per chart review pt d/c from CIR 1/26 at CGA/SBA level for transfers and  ambulation with RW.) ADLs Comments: Pt Independent with self-feeding and grooming. Pt reports bathing, dressing, and toileting with Supervision from wife for safety. Pt also reports use of shower chair, which he reports he dislikes but his wife "makes me use it." Pt's wife assists with IADLs. Pt is a retired Passenger transport manager. (Per chart review pt d/c from CIR 1/26 at CGA/SBA level for transfers and ambulation with RW.)   Self Care: Did the patient need help bathing, dressing, using the toilet or eating?  Independent   Indoor Mobility: Did the patient need assistance with walking from room to room (with or without device)? Independent   Stairs: Did the patient need assistance with internal or external stairs (with or without device)? Needed some help   Functional Cognition: Did the patient need help planning regular tasks such as shopping or remembering to take medications? Independent   Patient Information Are you of Hispanic, Latino/a,or Spanish origin?: A. No, not of Hispanic, Latino/a, or Spanish origin What is your race?: A. White Do you need  or want an interpreter to communicate with a doctor or health care staff?: 0. No   Patient's Response To:  Health Literacy and Transportation Is the patient able to respond to health literacy and transportation needs?: Yes Health Literacy - How often do you need to have someone help you when you read instructions, pamphlets, or other written material from your doctor or pharmacy?: Never In the past 12 months, has lack of transportation kept you from medical appointments or from getting medications?: No In the past 12 months, has lack of transportation kept you from meetings, work, or from getting things needed for daily living?: No   Home Assistive Devices / Equipment Home Equipment: Agricultural consultant (2 wheels), BSC/3in1, Information systems manager, Lift chair   Prior Device Use: Indicate devices/aids used by the patient prior to current illness,  exacerbation or injury? Walker   Current Functional Level Cognition   Orientation Level: Oriented X4    Extremity Assessment (includes Sensation/Coordination)   Upper Extremity Assessment: Right hand dominant, Overall WFL for tasks assessed, RUE deficits/detail, LUE deficits/detail RUE Deficits / Details: UE strength grossly 4/5; mildly decreased fine motor coordinaiton; mildly edematous throughout UE RUE Coordination: decreased fine motor (mild) LUE Deficits / Details: Shoulder strength 4-/5, otherwise UE strength grossly 4/5; mildly decreased fine motor coordinaiton; mildly edematous throughout UE LUE Coordination: decreased fine motor (mild)  Lower Extremity Assessment: Defer to PT evaluation RLE Deficits / Details: Grossly 4/5 strength RLE Sensation: history of peripheral neuropathy RLE Coordination: decreased gross motor LLE Deficits / Details: Pt has residual weakness from CVA in January 2025. He demonstrated grossly 3/5 strength. Did not resist d/t pt's c/o L hip pain with AROM. LLE: Unable to fully assess due to pain LLE Sensation: history of peripheral neuropathy LLE Coordination: decreased gross motor     ADLs   Overall ADL's : (P) Needs assistance/impaired Eating/Feeding: Set up, Sitting Grooming: Set up, Sitting Upper Body Bathing: Set up, Supervision/ safety, Sitting Lower Body Bathing: Minimal assistance, Cueing for safety, Cueing for compensatory techniques, Sit to/from stand Upper Body Dressing : Contact guard assist, Sitting (assist for line management) Lower Body Dressing: Moderate assistance, Sitting/lateral leans, Cueing for compensatory techniques Lower Body Dressing Details (indicate cue type and reason): pt. with notable post. lob.  states he usually is seated on toilet when he completes majority of LB ADLs.  laying all the way back on side of bed with pillow support on lower opposing bed rail for safety and support while he attempted to don disposable briefs.   pain in L hip limiting ROM for attempted figure four approach.  will benefit from A/E introduction next session Toilet Transfer: Ambulation, Rolling walker (2 wheels), Minimal assistance Toilet Transfer Details (indicate cue type and reason): simulated with ambulation around the bed and then back around bed and back to eob.  initial cues for hand placement to push up from eob but good carry over for the remaining portion of the session Toileting- Clothing Manipulation and Hygiene: Minimal assistance, Sit to/from stand, Cueing for safety, Cueing for compensatory techniques Tub/ Shower Transfer: Minimal assistance Functional mobility during ADLs: Contact guard assist, Minimal assistance General ADL Comments: cues for RW management.     Mobility   Overal bed mobility: Needs Assistance Bed Mobility: Supine to Sit, Sit to Supine Supine to sit: HOB elevated, Used rails, Contact guard Sit to supine: Contact guard assist, HOB elevated, Used rails General bed mobility comments: increased time to come to EOB-pt. reports he sleeps in a recliner., cues  for hand placement to assist with pushing from eob but good carryover otherwise.     Transfers   Overall transfer level: Needs assistance Equipment used: Rolling walker (2 wheels) Transfers: Sit to/from Stand, Bed to chair/wheelchair/BSC Sit to Stand: Contact guard assist, Min assist Bed to/from chair/wheelchair/BSC transfer type:: Step pivot Step pivot transfers: Contact guard assist General transfer comment: CGA for saftey     Ambulation / Gait / Stairs / Wheelchair Mobility   Ambulation/Gait Ambulation/Gait assistance: Contact guard assist, Min assist Gait Distance (Feet): 86 Feet Assistive device: Rolling walker (2 wheels) Gait Pattern/deviations: Trunk flexed, Step-through pattern, Decreased step length - right, Decreased step length - left General Gait Details: grossly CGA, up to min A to steady on turns     Posture / Balance Balance Overall  balance assessment: Needs assistance Sitting-balance support: No upper extremity supported, Feet supported Sitting balance-Leahy Scale: Good Standing balance support: Bilateral upper extremity supported, During functional activity Standing balance-Leahy Scale: Fair Standing balance comment: pt able to statically stand with no UE and no LOB or unsteadiness, RW support dynamically     Special needs/care consideration 4/26 stage 1 sacrum Recent stay at [facility] in past 2 months        Previous Home Environment  Living Arrangements: Spouse/significant other  Lives With: Spouse Available Help at Discharge: Family, Available 24 hours/day Type of Home: House Home Layout: Two level, Able to live on main level with bedroom/bathroom Alternate Level Stairs-Rails: Right, Left, Can reach both Alternate Level Stairs-Number of Steps: 14 Home Access: Stairs to enter Entrance Stairs-Rails: Right, Left, Can reach both Entrance Stairs-Number of Steps: 3 Bathroom Shower/Tub: Psychologist, counselling, Engineer, building services: Handicapped height Bathroom Accessibility: No Home Care Services: No   Discharge Living Setting Plans for Discharge Living Setting: Patient's home Type of Home at Discharge: House Discharge Home Access: Stairs to enter Entrance Stairs-Rails: Left, Right, Can reach both Entrance Stairs-Number of Steps: 3 Discharge Bathroom Shower/Tub: Walk-in shower, Curtain Discharge Bathroom Toilet: Handicapped height Discharge Bathroom Accessibility: No Does the patient have any problems obtaining your medications?: No   Social/Family/Support Systems Anticipated Caregiver: spouse Anticipated Industrial/product designer Information: [phone] Caregiver Availability: 24/7 Discharge Plan Discussed with Primary Caregiver: Yes Is Caregiver In Agreement with Plan?: Yes Does Caregiver/Family have Issues with Lodging/Transportation while Pt is in Rehab?: No   Goals Patient/Family Goal for Rehab:  Supervision, PT, OT, SLP Expected length of stay: 12-14 days Pt/Family Agrees to Admission and willing to participate: Yes Program Orientation Provided & Reviewed with Pt/Caregiver Including Roles  & Responsibilities: Yes  Barriers to Discharge: Insurance for SNF coverage   Decrease burden of Care through IP rehab admission: n/a   Possible need for SNF placement upon discharge:not anticipated   Patient Condition: This patient's medical and functional status has changed since the consult dated 08/08/22 in which the Rehabilitation Physician determined and documented that the patient was potentially appropriate for intensive rehabilitative care in an inpatient rehabilitation facility. Issues have been addressed and update has been discussed with Dr. Rachel Budds and patient now appropriate for inpatient rehabilitation. Will admit to inpatient rehab today.    Preadmission Screen Completed By:  Tanya Fantasia, RN MSN 08/13/2023 9:51 AM ______________________________________________________________________   Discussed status with Dr. Rachel Budds on 08/13/23 at 9:51 AM and received approval for admission today.   Admission Coordinator:  Tanya Fantasia, RN MSN time  9:51 AM Alanna Hu 08/13/23

## 2023-08-13 NOTE — Discharge Summary (Addendum)
 Physician Discharge Summary   Patient: John [name] MRN: [id number] DOB: Jul 16, 1945  Admit date:     08/06/2023  Discharge date: 08/13/23  Discharge Physician: Danette Duos   PCP: Jobe Mulder, DO   Recommendations at discharge:    Needs to complete antibiotics for bacteremia, 5 more days.  Needs Follow up with urology for further care foley catheter.  Discussed with ID, needs total 2 weeks antibiotics, Augmentin  .   Discharge Diagnoses: Principal Problem:   Severe sepsis (HCC) Active Problems:   E coli bacteremia   Acute urinary retention   Cholelithiasis   Insulin  dependent type 2 diabetes mellitus (HCC)   Fall at home, initial encounter   VTE (venous thromboembolism)   BPH (benign prostatic hyperplasia)   Heart block AV complete (HCC)   Peripheral neuropathy   GERD (gastroesophageal reflux disease)   Hyperlipidemia   Hypomagnesemia   Essential hypertension   Chronic diastolic CHF (congestive heart failure) (HCC)   Sacral pressure ulcer   B12 deficiency   Iron deficiency anemia   Non-ST elevation (NSTEMI) myocardial infarction [facility])  Resolved Problems:   Acute metabolic encephalopathy  Hospital Course: 78 year old with past medical history significant for BPH status post Foley placement, depression, hypertension, diabetes type 2, hyperlipidemia, neuropathy who presented with worsening weakness, lethargy and recurrent falls at home.  He recently had Foley catheter placement by urology on 07/13/2023 in the setting of BPH and outflow obstruction.  Due to persistent weakness he presented for further evaluation.  Urinalysis was concerning for signs of infection.  Blood cultures were drawn and he was admitted for further workup.  Found to have E. coli bacteremia, sepsis secondary to UTI, imaging concerning for cholelithiasis, HIDA scan concerning for chronic cholecystitis or biliary akinesis. Had Hypotensive episode, associated with diaphoresis, pale. EKG showed  T wave changes, elevated troponin, had Cath found to have MI, had stent placement. Cholecystectomy will be postpone out patient in 6 months. Surgery doesn't think patient has cholecystitis.    Assessment and Plan:  Consultants: Cardiology  Procedures performed: cath    1-Severe sepsis in the setting of  UTI and E. coli bacteremia - Patient presented with tachycardia, tachypnea, leukocytosis, lactic acidosis.  Source suspected UTI  - Patient received IV fluids.  IV antibiotics. - Patient had recent Foley catheter change by [facility]/10.  Evaluated by urology on 4/27 who does not recommend Foley exchange at this time. Urine culture growing Klebsiella and E. Coli -Continue with IV Anceff, day 5. Repeated blood culture no growth to date. -Discussed with ID, recommend 2 weeks total antibiotics with Augmentin   E. coli bacteremia: - Presumed translocation from urinary source versus possible GI source - Continue with IV Anceff Repeated blood culture 4/30 no growth to date.   See above  MI/NSTEMI Hypotension,  Near syncope Patient became hypotensive  cold, clammy, pale, drowsy disoriented EKG some EKG changes T wave inversion Troponin ordered and elevated cardiology has been consulted CT head negative.  Cardiology recommend Cath, he underwent cath 4/30 which showed Left dominant coronary arteries with significant three-vessel coronary artery disease. The culprit is severe stenosis in the mid LAD. 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 Successful angioplasty and drug-eluting stent placement to the mid LAD  -Started on Plavix  and aspirin . Ok to discontinue aspirin  after eliquis  is resume per cardiology  -started on carvedilol , spironolactone .    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 : -Patient has had  persistent vomiting postprandial especially with fatty meal over the last  month - Right upper quadrant ultrasound cholelithiasis with gallbladder sludge. - HIDA 4/28 show ejection fraction 4% for chronic cholecystitis/biliary dyskinesia  - Surgery consulted plan was initially for cholecystectomy, due to events from 4/30, patient had MI, plan is for elective cholecystectomy in 6 months.  Per Dr Lanell Pinta, tx with 1 week antibiotics.    BPH acute urinary retention Has chronic Foley catheter He is leaking around the catheter, urology consulted. Nurse to do flushes to catheter. Nursed flushed catheter, resolved.    VTE, diagnosed DVT left femoral vein SF pain 1/25 -History of PE right lower lobe 1/25 He has been compliant with Eliquis  Resume eliquis  no procedure planned./    Fall at home Needs PT>      Diabetes type 2 -SSI.    Heart block completed status post pacemaker - Pacemaker placed 05/12/2023     Iron deficiency anemia - Started on replacement   B12 deficiency - He was started on B12 replacement   Sacral pressure ulcer - See wound care documentation below   Chronic diastolic heart failure - Recent echo 04/2023 ejection fraction 65%. -.  IV fluids   Essential hypertension - Not on blood pressure medication at home and currently blood pressure soft   Hypomagnesemia Replaced   Hyperlipidemia Continue with Lipitor    GERD PPI   Hypokalemia: Replete Hypophosphatemia; replaced.    Wound care documentation below     Pressure Injury 05/11/23 Sacrum Right;Left;Mid Stage 1 -  Intact skin with non-blanchable redness of a localized area usually over a bony prominence. (Active)  05/11/23 1600  Location: Sacrum  Location Orientation: Right;Left;Mid  Staging: Stage 1 -  Intact skin with non-blanchable redness of a localized area usually over a bony prominence.  Wound Description (Comments):   Present on Admission:      Pressure Injury 08/06/23 Sacrum Medial Stage 1 -  Intact skin with non-blanchable redness of a localized area usually over a  bony prominence. (Active)  08/06/23 2250  Location: Sacrum  Location Orientation: Medial  Staging: Stage 1 -  Intact skin with non-blanchable redness of a localized area usually over a bony prominence.  Wound Description (Comments):   Present on Admission: Yes  Dressing Type Foam - Lift dressing to assess site every shift 08/11/23 0710        Nutrition Problem: Severe Malnutrition Etiology: chronic illness  sposition: Rehabilitation facility Diet recommendation:  Discharge Diet Orders (From admission, onward)     Start     Ordered   08/13/23 0000  Diet - low sodium heart healthy        08/13/23 1019           Cardiac diet DISCHARGE MEDICATION: Allergies as of 08/13/2023       Reactions   Ramipril Anaphylaxis   Other Diarrhea   Severe intolerance to Chemotherapy in the past.   Sertraline Other (See Comments)   "Extreme headaches"   Adhesive [tape] Rash        Medication List     STOP taking these medications    Jardiance  25 MG Tabs tablet Generic drug: empagliflozin    metFORMIN  500 MG 24 hr tablet Commonly known as: GLUCOPHAGE -XR       TAKE these medications    Accu-Chek Guide w/Device Kit Use daily to check blood sugar.  DX E11.69   Accu-Chek Softclix Lancet Dev Kit Use daily to check blood sugar.  DX E11.69   acetaminophen  650 MG  CR tablet Commonly known as: TYLENOL  Take 1 tablet (650 mg total) by mouth every 8 (eight) hours as needed for pain.   amoxicillin -clavulanate 875-125 MG tablet Commonly known as: AUGMENTIN  Take 1 tablet by mouth 2 (two) times daily for 8 days.   atorvastatin  80 MG tablet Commonly known as: LIPITOR  Take 1 tablet (80 mg total) by mouth at bedtime.   buPROPion  75 MG tablet Commonly known as: WELLBUTRIN  Take 1 tablet (75 mg total) by mouth 2 (two) times daily.   carvedilol  3.125 MG tablet Commonly known as: COREG  Take 1 tablet (3.125 mg total) by mouth 2 (two) times daily with a meal.   clopidogrel  75 MG  tablet Commonly known as: PLAVIX  Take 1 tablet (75 mg total) by mouth daily with breakfast. Start taking on: Aug 14, 2023   clotrimazole -betamethasone  cream Commonly known as: LOTRISONE  Apply 1 Application topically daily.   cyanocobalamin  1000 MCG tablet Take 1 tablet (1,000 mcg total) by mouth daily. Start taking on: Aug 14, 2023   Eliquis  5 MG Tabs tablet Generic drug: apixaban  Take 1 tablet (5 mg total) by mouth 2 (two) times daily.   ferrous sulfate  325 (65 FE) MG tablet Take 1 tablet (325 mg total) by mouth daily with breakfast. Start taking on: Aug 14, 2023   finasteride  5 MG tablet Commonly known as: PROSCAR  Take 1 tablet (5 mg total) by mouth daily.   multivitamin tablet Take 1 tablet by mouth 2 (two) times a week.   pantoprazole  40 MG tablet Commonly known as: PROTONIX  Take 1 tablet (40 mg total) by mouth daily. (Take in place of omeprazole  while taking clopidogrel .)   polyethylene glycol 17 g packet Commonly known as: MIRALAX  / GLYCOLAX  Take 17 g by mouth 2 (two) times daily. What changed:  when to take this reasons to take this   promethazine  25 MG tablet Commonly known as: PHENERGAN  Take 0.5-1 tablets (12.5-25 mg total) by mouth every 8 (eight) hours as needed for nausea or vomiting.   spironolactone  25 MG tablet Commonly known as: ALDACTONE  Take 0.5 tablets (12.5 mg total) by mouth daily. Start taking on: Aug 14, 2023   tamsulosin  0.4 MG Caps capsule Commonly known as: FLOMAX  Take 1 capsule (0.4 mg total) by mouth daily.   triamcinolone  0.1 % cream : eucerin Crea Apply 1 Application topically 2 (two) times daily.   vitamin C 1000 MG tablet Take 1,000 mg by mouth 3 (three) times a week.               Discharge Care Instructions  (From admission, onward)           Start     Ordered   08/13/23 0000  Discharge wound care:       Comments: See above   08/13/23 1019            Follow-up Information     Surgery, [facility].  Call in 6 month(s).   Specialty: General Surgery Contact information: [address] [place] [address] [phone]                Discharge Exam: Cleavon Curls Weights   08/06/23 2250 08/08/23 0400  Weight: 90 kg 91.7 kg   General; NAD  Condition at discharge: stable  The results of significant diagnostics from this hospitalization (including imaging, microbiology, ancillary and laboratory) are listed below for reference.   Imaging Studies: ECHOCARDIOGRAM COMPLETE Result Date: 08/11/2023    ECHOCARDIOGRAM REPORT   Patient Name:  Tali THOMAS Buchberger Date of Exam: 08/11/2023 Medical Rec #:  [id number]        Height:       72.0 in Accession #:    [id number]       Weight:       202.2 lb Date of Birth:  06/02/1945        BSA:          2.140 m Patient Age:    78 years         BP:           129/70 mmHg Patient Gender: M                HR:           74 bpm. Exam Location:  Inpatient Procedure: 2D Echo, Cardiac Doppler and Color Doppler (Both Spectral and Color            Flow Doppler were utilized during procedure). Indications:    122-I22.9 Subsequent ST elevation (STEM) and non-ST elevation                 (NSTEMI) myocardial infarction  History:        Patient has prior history of Echocardiogram examinations, most                 recent 05/10/2023.  Sonographer:    Andrena Bang Referring Phys: [id number] MUHAMMAD A ARIDA IMPRESSIONS  1. Left ventricular ejection fraction, by estimation, is 30 to 35%. The left ventricle has moderately decreased function. The left ventricle demonstrates regional wall motion abnormalities with mid to apical anteroseptal and inferoseptal akinesis and akinesis of the apical inferior/anterior/lateral walls. Akinesis of the true apex. Findings suggest LAD territory infarction. No LV thrombus. There is mild concentric left ventricular hypertrophy. Left ventricular diastolic parameters are consistent with  Grade I diastolic dysfunction (impaired relaxation).  2. Right  ventricular systolic function is normal. The right ventricular size is normal. There is mildly elevated pulmonary artery systolic pressure. The estimated right ventricular systolic pressure is 41.9 mmHg.  3. The aortic valve is tricuspid. There is mild calcification of the aortic valve. Aortic valve regurgitation is mild. No aortic stenosis is present.  4. The inferior vena cava is dilated in size with >50% respiratory variability, suggesting right atrial pressure of 8 mmHg.  5. The mitral valve is normal in structure. No evidence of mitral valve regurgitation. No evidence of mitral stenosis. FINDINGS  Left Ventricle: Left ventricular ejection fraction, by estimation, is 30 to 35%. The left ventricle has moderately decreased function. The left ventricle demonstrates regional wall motion abnormalities. Definity  contrast agent was given IV to delineate the left ventricular endocardial borders. The left ventricular internal cavity size was normal in size. There is mild concentric left ventricular hypertrophy. Left ventricular diastolic parameters are consistent with Grade I diastolic dysfunction (impaired relaxation). Right Ventricle: The right ventricular size is normal. No increase in right ventricular wall thickness. Right ventricular systolic function is normal. There is mildly elevated pulmonary artery systolic pressure. The tricuspid regurgitant velocity is 2.91  m/s, and with an assumed right atrial pressure of 8 mmHg, the estimated right ventricular systolic pressure is 41.9 mmHg. Left Atrium: Left atrial size was normal in size. Right Atrium: Right atrial size was normal in size. Pericardium: There is no evidence of pericardial effusion. Mitral Valve: The mitral valve is normal in structure. No evidence of mitral valve regurgitation. No evidence of mitral valve stenosis. Tricuspid Valve: The tricuspid valve is  normal in structure. Tricuspid valve regurgitation is trivial. Aortic Valve: The aortic valve is  tricuspid. There is mild calcification of the aortic valve. Aortic valve regurgitation is mild. No aortic stenosis is present. Aortic valve mean gradient measures 3.0 mmHg. Aortic valve peak gradient measures 6.5 mmHg. Aortic valve area, by VTI measures 2.34 cm. Pulmonic Valve: The pulmonic valve was normal in structure. Pulmonic valve regurgitation is trivial. Aorta: The aortic root is normal in size and structure. Venous: The inferior vena cava is dilated in size with greater than 50% respiratory variability, suggesting right atrial pressure of 8 mmHg. IAS/Shunts: No atrial level shunt detected by color flow Doppler. Additional Comments: A device lead is visualized in the right ventricle.  LEFT VENTRICLE PLAX 2D LVIDd:         5.20 cm      Diastology LVIDs:         3.70 cm      LV e' medial:    4.90 cm/s LV PW:         1.50 cm      LV E/e' medial:  15.3 LV IVS:        1.20 cm      LV e' lateral:   5.00 cm/s LVOT diam:     2.10 cm      LV E/e' lateral: 15.0 LV SV:         62 LV SV Index:   29 LVOT Area:     3.46 cm  LV Volumes (MOD) LV vol d, MOD A2C: 152.0 ml LV vol d, MOD A4C: 154.0 ml LV vol s, MOD A2C: 88.5 ml LV vol s, MOD A4C: 111.0 ml LV SV MOD A2C:     63.5 ml LV SV MOD A4C:     154.0 ml LV SV MOD BP:      55.3 ml RIGHT VENTRICLE RV S prime:     11.65 cm/s TAPSE (M-mode): 2.0 cm LEFT ATRIUM             Index LA diam:        4.10 cm 1.92 cm/m LA Vol (A2C):   45.8 ml 21.40 ml/m LA Vol (A4C):   45.4 ml 21.21 ml/m LA Biplane Vol: 48.4 ml 22.61 ml/m  AORTIC VALVE AV Area (Vmax):    2.24 cm AV Area (Vmean):   2.06 cm AV Area (VTI):     2.34 cm AV Vmax:           127.00 cm/s AV Vmean:          85.400 cm/s AV VTI:            0.264 m AV Peak Grad:      6.5 mmHg AV Mean Grad:      3.0 mmHg LVOT Vmax:         82.20 cm/s LVOT Vmean:        50.800 cm/s LVOT VTI:          0.178 m LVOT/AV VTI ratio: 0.67  AORTA Ao Asc diam: 3.20 cm MITRAL VALVE                TRICUSPID VALVE MV Area (PHT): 3.81 cm     TR Peak  grad:   33.9 mmHg MV Decel Time: 199 msec     TR Vmax:        291.00 cm/s MV E velocity: 74.90 cm/s MV A velocity: 108.00 cm/s  SHUNTS MV E/A ratio:  0.69  Systemic VTI:  0.18 m                             Systemic Diam: 2.10 cm Dalton McleanMD Electronically signed by Archer Bear Signature Date/Time: 08/11/2023/10:48:36 AM    Final    CARDIAC CATHETERIZATION Addendum Date: 08/10/2023   3rd Mrg lesion is 70% stenosed.   Prox RCA lesion is 95% stenosed.   1st LPL lesion is 30% stenosed.   LPDA lesion is 80% stenosed.   Mid LAD-1 lesion is 95% stenosed.   Mid LAD-2 lesion is 50% stenosed.   Dist LAD lesion is 40% stenosed.   Prox LAD to Mid LAD lesion is 40% stenosed.   Lat 1st Diag lesion is 80% stenosed.   A drug-eluting stent was successfully placed using a STENT SYNERGY XD 2.50X28.   Post intervention, there is a 0% residual stenosis.   Post intervention, there is a 0% residual stenosis.   LV end diastolic pressure is mildly elevated. 1.  Left dominant coronary arteries with significant three-vessel coronary artery disease.  The culprit is severe stenosis in the mid LAD.  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 2.  Left ventricular angiography was not diagnostic.  Will obtain an echocardiogram.  LVEDP was mildly to moderately elevated at 20 mmHg. 3.  Successful angioplasty and drug-eluting stent placement to the mid LAD. Recommendations: Will treat with aspirin  and clopidogrel  for now.  Heparin  can be resumed 2 hours after TR band removal given that he is on anticoagulation for pulmonary embolism.  Aspirin  can be stopped once the patient is started on a DOAC.  Clopidogrel  should be continued for at least 6 months. The rest of small vessel disease should be managed medically. Unfortunately, his cholecystectomy will need to be postponed.  Result Date: 08/10/2023   3rd Mrg lesion is 70% stenosed.   Prox RCA lesion is 95%  stenosed.   1st LPL lesion is 30% stenosed.   LPDA lesion is 80% stenosed.   Mid LAD-1 lesion is 95% stenosed.   Mid LAD-2 lesion is 50% stenosed.   Dist LAD lesion is 40% stenosed.   Prox LAD to Mid LAD lesion is 40% stenosed.   Lat 1st Diag lesion is 80% stenosed.   A drug-eluting stent was successfully placed using a STENT SYNERGY XD 2.50X28.   Post intervention, there is a 0% residual stenosis.   Post intervention, there is a 0% residual stenosis.   LV end diastolic pressure is mildly elevated. 1.  Left dominant coronary arteries with significant three-vessel coronary artery disease.  The culprit is severe stenosis in the mid LAD.  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 2.  Left ventricular angiography was not diagnostic.  Will obtain an echocardiogram.  LVEDP was mildly to moderately elevated at 20 mmHg. 3.  Successful angioplasty and drug-eluting stent placement to the mid LAD. Recommendations: Will treat with aspirin  and clopidogrel  for now.  Heparin  can be resumed 2 hours after TR band removal given that he is on anticoagulation for pulmonary embolism.  Aspirin  can be stopped once the patient is started on a DOAC.  Clopidogrel  should be continued for at least 6 months. The rest of small vessel disease should be managed medically.   CT HEAD WO CONTRAST ( ) Result Date: 08/10/2023 CLINICAL DATA:  Altered mental status, fell  1 week ago. EXAM: CT HEAD WITHOUT CONTRAST TECHNIQUE: Contiguous axial images were obtained from the base of the skull through the vertex without intravenous contrast. RADIATION DOSE REDUCTION: This exam was performed according to the departmental dose-optimization program which includes automated exposure control, adjustment of the mA and/or kV according to patient size and/or use of iterative reconstruction technique. COMPARISON:  08/06/2023 FINDINGS: Brain: No change or acute finding. Chronic  small-vessel ischemic changes affect pons. 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 Chronic small-vessel ischemic changes of the white matter. No sign of acute infarction, mass lesion, hemorrhage, hydrocephalus or extra-axial collection. Vascular: There is atherosclerotic calcification of the major vessels at the base of the brain. Skull: Negative Sinuses/Orbits: Clear/normal Other: None IMPRESSION: No acute finding by CT. Chronic small-vessel ischemic changes of the pons, thalami and cerebral hemispheric white matter. Electronically Signed   By: Bettylou Brunner M.D.   On: 08/10/2023 13:17   NM Hepato W/EF Result Date: 08/08/2023 CLINICAL DATA:  Vomiting, concern for cholecystitis. EXAM: NUCLEAR MEDICINE HEPATOBILIARY IMAGING WITH GALLBLADDER EF TECHNIQUE: Sequential images of the abdomen were obtained out to 60 minutes following intravenous administration of radiopharmaceutical. After oral ingestion of Ensure, gallbladder ejection fraction was determined. At 60 min, normal ejection fraction is greater than 33%. RADIOPHARMACEUTICALS:  5.5 mCi Tc-35m  Choletec  IV COMPARISON:  Ultrasound August 07, 2023 FINDINGS: Prompt uptake and biliary excretion of activity by the liver is seen. Gallbladder activity is visualized, consistent with patency of cystic duct. Biliary activity passes into small bowel, consistent with patent common bile duct. Calculated gallbladder ejection fraction is 4%. (Normal gallbladder ejection fraction with Ensure is greater than 33% and less than 80%.) IMPRESSION: Reduced gallbladder ejection fraction as can be seen with chronic cholecystitis/biliary dyskinesia. Electronically Signed   By: Tama Fails M.D.   On: 08/08/2023 15:18   US  Abdomen Limited RUQ (LIVER/GB) Result Date: 08/07/2023 CLINICAL DATA:  Sepsis. EXAM: ULTRASOUND ABDOMEN LIMITED RIGHT UPPER QUADRANT COMPARISON:  CT AP from 08/06/2023 FINDINGS: Gallbladder: There are multiple stones with sludge within the gallbladder. These  measure up to 1 cm. The gallbladder wall thickness measures 3.2 mm. Negative sonographic Murphy's sign. No pericholecystic fluid. Common bile duct: Diameter: 3.1 mm.  No intrahepatic bile duct dilatation. Liver: No focal lesion identified. Within normal limits in parenchymal echogenicity. Portal vein is patent on color Doppler imaging with normal direction of blood flow towards the liver. Other: None. IMPRESSION: Cholelithiasis and gallbladder sludge. Mild gallbladder wall thickening. Negative sonographic Murphy's sign. Findings are equivocal for cholecystitis. If there is a high clinical suspicion for acute cholecystitis consider further evaluation with nuclear medicine HIDA scan. Electronically Signed   By: Kimberley Penman M.D.   On: 08/07/2023 07:50   CT ABDOMEN PELVIS W CONTRAST Result Date: 08/06/2023 CLINICAL DATA:  Abdominal pain, sepsis EXAM: CT ABDOMEN AND PELVIS WITH CONTRAST TECHNIQUE: Multidetector CT imaging of the abdomen and pelvis was performed using the standard protocol following bolus administration of intravenous contrast. RADIATION DOSE REDUCTION: This exam was performed according to the departmental dose-optimization program which includes automated exposure control, adjustment of the mA and/or kV according to patient size and/or use of iterative reconstruction technique. CONTRAST:  75mL OMNIPAQUE  IOHEXOL  350 MG/ML SOLN COMPARISON:  08/29/2020 FINDINGS: Lower chest: Bibasilar dependent hypoventilatory changes. No acute pleural or parenchymal lung disease. Hepatobiliary: Marked gallbladder distension, with multiple calcified gallstones layering dependently. No gallbladder wall thickening or pericholecystic fluid to suggest cholecystitis. Small amount of free fluid along the dome of the liver. Liver  parenchyma is unremarkable. No biliary duct dilation. Pancreas: Unremarkable. No pancreatic ductal dilatation or surrounding inflammatory changes. Spleen: Normal in size without focal abnormality.  Adrenals/Urinary Tract: Kidneys enhance normally and symmetrically. No urinary tract calculi or obstructive uropathy within either kidney. Stable nodular thickening of the adrenal glands. Bladder is decompressed with a Foley catheter, limiting its evaluation. Stomach/Bowel: No bowel obstruction or ileus. Normal appendix right lower quadrant. No bowel wall thickening or inflammatory change. Vascular/Lymphatic: Aortic atherosclerosis. No enlarged abdominal or pelvic lymph nodes. Reproductive: Prostate is unremarkable. Other: Small amount of fluid along the dome of the liver. No free intraperitoneal gas. No abdominal wall hernia. Musculoskeletal: No acute or destructive bony abnormalities. Reconstructed images demonstrate no additional findings. IMPRESSION: 1. Distended gallbladder, with multiple calcified gallstones layering dependently. No CT evidence of acute cholecystitis. 2. Trace free fluid along the dome of the liver, nonspecific. 3.  Aortic Atherosclerosis (ICD10-I70.0). Electronically Signed   By: Bobbye Burrow M.D.   On: 08/06/2023 23:20   DG Chest Port 1 View Result Date: 08/06/2023 CLINICAL DATA:  Questionable sepsis.  Evaluate abnormality. EXAM: PORTABLE CHEST 1 VIEW COMPARISON:  Two-view chest x-ray 05/14/2023 FINDINGS: The heart size is normal. Pacing wires are stable. Mild pulmonary vascular congestion is present. Linear atelectasis is present in the right midlung. The lungs are otherwise clear. The visualized soft tissues and bony thorax are unremarkable. IMPRESSION: 1. Mild pulmonary vascular congestion. 2. Linear atelectasis in the right midlung. Electronically Signed   By: Audree Leas M.D.   On: 08/06/2023 18:50   CT Head Wo Contrast Result Date: 08/06/2023 CLINICAL DATA:  Head trauma. Ground level fall last night. Patient's wife was concerned for a stroke last night. Patient woke up more confused today with vomiting. EXAM: CT HEAD WITHOUT CONTRAST TECHNIQUE: Contiguous axial images  were obtained from the base of the skull through the vertex without intravenous contrast. RADIATION DOSE REDUCTION: This exam was performed according to the departmental dose-optimization program which includes automated exposure control, adjustment of the mA and/or kV according to patient size and/or use of iterative reconstruction technique. COMPARISON:  CT head without contrast 05/08/2023. MR head without contrast/26/25. FINDINGS: Brain: Remote lacunar infarcts are again noted within the thalami bilaterally in the left caudate head. A remote lacunar infarct is Vascular: Acute dilation of the left lateral ventricle is noted. 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 No acute infarct, hemorrhage, or mass lesion is present. No significant extraaxial fluid collection is present. The brainstem and cerebellum are within normal limits. Midline structures are within normal limits. Skull: Calvarium is intact. No focal lytic or blastic lesions are present. No significant extracranial soft tissue lesion is present. Sinuses/Orbits: The paranasal sinuses and mastoid air cells are clear. Bilateral lens replacements are noted. Globes and orbits are otherwise unremarkable. IMPRESSION: 1. No acute intracranial abnormality or significant interval change. 2. 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 3. 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 Electronically Signed   By: Audree Leas M.D.   On: 08/06/2023 18:49    Microbiology: Results for orders placed or performed during the hospital encounter of 08/06/23  Blood Culture (routine x 2)     Status: Abnormal   Collection Time: 08/06/23  5:13 PM   Specimen: BLOOD  Result Value Ref Range Status   Specimen Description BLOOD RIGHT ANTECUBITAL  Final   Special Requests   Final    BOTTLES DRAWN AEROBIC  ONLY Blood Culture results may  not be optimal due to an inadequate volume of blood received in culture bottles   Culture  Setup Time   Final    GRAM NEGATIVE RODS AEROBIC BOTTLE ONLY CRITICAL VALUE NOTED.  VALUE IS CONSISTENT WITH PREVIOUSLY REPORTED AND CALLED VALUE.    Culture (A)  Final    ESCHERICHIA COLI SUSCEPTIBILITIES PERFORMED ON PREVIOUS CULTURE WITHIN THE LAST 5 DAYS. Performed at [facility], [address] [place], [place] [address]    Report Status 08/12/2023 FINAL  Final  Urine Culture     Status: Abnormal   Collection Time: 08/06/23  5:13 PM   Specimen: Urine, Random  Result Value Ref Range Status   Specimen Description URINE, RANDOM  Final   Special Requests   Final    URINE, CATHETERIZED Performed at [facility], [address] [place], [place] [address]    Culture (A)  Final    >=100,000 COLONIES/mL KLEBSIELLA PNEUMONIAE >=100,000 COLONIES/mL ESCHERICHIA COLI    Report Status 08/09/2023 FINAL  Final   Organism ID, Bacteria KLEBSIELLA PNEUMONIAE (A)  Final   Organism ID, Bacteria ESCHERICHIA COLI (A)  Final      Susceptibility   Escherichia coli - MIC*    AMPICILLIN 4 SENSITIVE Sensitive     CEFAZOLIN  <=4 SENSITIVE Sensitive     CEFEPIME  <=0.12 SENSITIVE Sensitive     CEFTRIAXONE  <=0.25 SENSITIVE Sensitive     CIPROFLOXACIN <=0.25 SENSITIVE Sensitive     GENTAMICIN  <=1 SENSITIVE Sensitive     IMIPENEM <=0.25 SENSITIVE Sensitive     NITROFURANTOIN <=16 SENSITIVE Sensitive     TRIMETH/SULFA <=20 SENSITIVE Sensitive     AMPICILLIN/SULBACTAM <=2 SENSITIVE Sensitive     PIP/TAZO <=4 SENSITIVE Sensitive ug/mL    * >=100,000 COLONIES/mL ESCHERICHIA COLI   Klebsiella pneumoniae - MIC*    AMPICILLIN >=32 RESISTANT Resistant     CEFAZOLIN  <=4 SENSITIVE Sensitive     CEFEPIME  <=0.12 SENSITIVE Sensitive     CEFTRIAXONE  <=0.25 SENSITIVE Sensitive     CIPROFLOXACIN <=0.25 SENSITIVE Sensitive     GENTAMICIN  <=1 SENSITIVE Sensitive     IMIPENEM <=0.25 SENSITIVE Sensitive      NITROFURANTOIN 64 INTERMEDIATE Intermediate     TRIMETH/SULFA <=20 SENSITIVE Sensitive     AMPICILLIN/SULBACTAM 4 SENSITIVE Sensitive     PIP/TAZO <=4 SENSITIVE Sensitive ug/mL    * >=100,000 COLONIES/mL KLEBSIELLA PNEUMONIAE  Blood Culture (routine x 2)     Status: Abnormal   Collection Time: 08/06/23  5:26 PM   Specimen: BLOOD RIGHT HAND  Result Value Ref Range Status   Specimen Description BLOOD RIGHT HAND  Final   Special Requests   Final    BOTTLES DRAWN AEROBIC AND ANAEROBIC Blood Culture results may not be optimal due to an inadequate volume of blood received in culture bottles   Culture  Setup Time   Final    GRAM NEGATIVE RODS ANAEROBIC BOTTLE ONLY CRITICAL RESULT CALLED TO, READ BACK BY AND VERIFIED WITH: JWYLAND,[email or web address]@[email or web address]  08/07/23 MK Performed at [facility], [address], [place], [place] [address]    Culture ESCHERICHIA COLI (A)  Final   Report Status 08/09/2023 FINAL  Final   Organism ID, Bacteria ESCHERICHIA COLI  Final   Organism ID, Bacteria ESCHERICHIA COLI  Final      Susceptibility   Escherichia coli - KIRBY BAUER*    CEFAZOLIN  SENSITIVE Sensitive    Escherichia coli - MIC*    AMPICILLIN 4 SENSITIVE Sensitive  CEFEPIME  <=0.12 SENSITIVE Sensitive     CEFTAZIDIME <=1 SENSITIVE Sensitive     CEFTRIAXONE  <=0.25 SENSITIVE Sensitive     CIPROFLOXACIN <=0.25 SENSITIVE Sensitive     GENTAMICIN  <=1 SENSITIVE Sensitive     IMIPENEM <=0.25 SENSITIVE Sensitive     TRIMETH/SULFA <=20 SENSITIVE Sensitive     AMPICILLIN/SULBACTAM <=2 SENSITIVE Sensitive     PIP/TAZO <=4 SENSITIVE Sensitive ug/mL    * ESCHERICHIA COLI    ESCHERICHIA COLI  Blood Culture ID Panel (Reflexed)     Status: Abnormal   Collection Time: 08/06/23  5:26 PM  Result Value Ref Range Status   Enterococcus faecalis NOT DETECTED NOT DETECTED Final   Enterococcus Faecium NOT DETECTED NOT DETECTED Final   Listeria monocytogenes NOT DETECTED NOT DETECTED Final   Staphylococcus species NOT  DETECTED NOT DETECTED Final   Staphylococcus aureus (BCID) NOT DETECTED NOT DETECTED Final   Staphylococcus epidermidis NOT DETECTED NOT DETECTED Final   Staphylococcus lugdunensis NOT DETECTED NOT DETECTED Final   Streptococcus species NOT DETECTED NOT DETECTED Final   Streptococcus agalactiae NOT DETECTED NOT DETECTED Final   Streptococcus pneumoniae NOT DETECTED NOT DETECTED Final   Streptococcus pyogenes NOT DETECTED NOT DETECTED Final   A.calcoaceticus-baumannii NOT DETECTED NOT DETECTED Final   Bacteroides fragilis NOT DETECTED NOT DETECTED Final   Enterobacterales DETECTED (A) NOT DETECTED Final    Comment: Enterobacterales represent a large order of gram negative bacteria, not a single organism. CRITICAL RESULT CALLED TO, READ BACK BY AND VERIFIED WITH: J WYLAND,[email or web address]@[email or web address]  08/07/23 MK    Enterobacter cloacae complex NOT DETECTED NOT DETECTED Final   Escherichia coli DETECTED (A) NOT DETECTED Final    Comment: CRITICAL RESULT CALLED TO, READ BACK BY AND VERIFIED WITH: J WYLAND,[email or web address]@[email or web address]  08/07/23 MK    Klebsiella aerogenes NOT DETECTED NOT DETECTED Final   Klebsiella oxytoca NOT DETECTED NOT DETECTED Final   Klebsiella pneumoniae NOT DETECTED NOT DETECTED Final   Proteus species NOT DETECTED NOT DETECTED Final   Salmonella species NOT DETECTED NOT DETECTED Final   Serratia marcescens NOT DETECTED NOT DETECTED Final   Haemophilus influenzae NOT DETECTED NOT DETECTED Final   Neisseria meningitidis NOT DETECTED NOT DETECTED Final   Pseudomonas aeruginosa NOT DETECTED NOT DETECTED Final   Stenotrophomonas maltophilia NOT DETECTED NOT DETECTED Final   Candida albicans NOT DETECTED NOT DETECTED Final   Candida auris NOT DETECTED NOT DETECTED Final   Candida glabrata NOT DETECTED NOT DETECTED Final   Candida krusei NOT DETECTED NOT DETECTED Final   Candida parapsilosis NOT DETECTED NOT DETECTED Final   Candida tropicalis NOT DETECTED NOT DETECTED Final   Cryptococcus  neoformans/gattii NOT DETECTED NOT DETECTED Final   CTX-M ESBL NOT DETECTED NOT DETECTED Final   Carbapenem resistance IMP NOT DETECTED NOT DETECTED Final   Carbapenem resistance KPC NOT DETECTED NOT DETECTED Final   Carbapenem resistance NDM NOT DETECTED NOT DETECTED Final   Carbapenem resist OXA 48 LIKE NOT DETECTED NOT DETECTED Final   Carbapenem resistance VIM NOT DETECTED NOT DETECTED Final    Comment: Performed at [facility], [address], [place], [place] [address]  Resp panel by RT-PCR (RSV, Flu A&B, Covid) Anterior Nasal Swab     Status: None   Collection Time: 08/06/23  5:53 PM   Specimen: Anterior Nasal Swab  Result Value Ref Range Status   SARS Coronavirus 2 by RT PCR NEGATIVE NEGATIVE Final   Influenza A by PCR NEGATIVE NEGATIVE Final   Influenza B by PCR NEGATIVE NEGATIVE  Final    Comment: (NOTE) The Xpert Xpress SARS-CoV-2/FLU/RSV plus assay is intended as an aid in the diagnosis of influenza from Nasopharyngeal swab specimens and should not be used as a sole basis for treatment. Nasal washings and aspirates are unacceptable for Xpert Xpress SARS-CoV-2/FLU/RSV testing.  Fact Sheet for Patients: BloggerCourse.com  Fact Sheet for Healthcare Providers: SeriousBroker.it  This test is not yet approved or cleared by the [place]  FDA and has been authorized for detection and/or diagnosis of SARS-CoV-2 by FDA under an Emergency Use Authorization (EUA). This EUA will remain in effect (meaning this test can be used) for the duration of the COVID-19 declaration under Section 564(b)(1) of the Act, 21 U.S.C. section 360bbb-3(b)(1), unless the authorization is terminated or revoked.     Resp Syncytial Virus by PCR NEGATIVE NEGATIVE Final    Comment: (NOTE) Fact Sheet for Patients: BloggerCourse.com  Fact Sheet for Healthcare Providers: SeriousBroker.it  This  test is not yet approved or cleared by the [place]  FDA and has been authorized for detection and/or diagnosis of SARS-CoV-2 by FDA under an Emergency Use Authorization (EUA). This EUA will remain in effect (meaning this test can be used) for the duration of the COVID-19 declaration under Section 564(b)(1) of the Act, 21 U.S.C. section 360bbb-3(b)(1), unless the authorization is terminated or revoked.  Performed at [facility], [address] [place], [place] [address]   Culture, blood (Routine X 2) w Reflex to ID Panel     Status: None (Preliminary result)   Collection Time: 08/10/23 11:53 AM   Specimen: BLOOD RIGHT ARM  Result Value Ref Range Status   Specimen Description BLOOD RIGHT ARM  Final   Special Requests   Final    BOTTLES DRAWN AEROBIC ONLY Blood Culture results may not be optimal due to an inadequate volume of blood received in culture bottles   Culture   Final    NO GROWTH 3 DAYS Performed at [facility], [address] [place], [place] [address]    Report Status PENDING  Incomplete  Culture, blood (Routine X 2) w Reflex to ID Panel     Status: None (Preliminary result)   Collection Time: 08/10/23 11:53 AM   Specimen: BLOOD RIGHT HAND  Result Value Ref Range Status   Specimen Description BLOOD RIGHT HAND  Final   Special Requests   Final    BOTTLES DRAWN AEROBIC ONLY Blood Culture results may not be optimal due to an inadequate volume of blood received in culture bottles   Culture   Final    NO GROWTH 3 DAYS Performed at [facility], [address], [place], [place] [address]    Report Status PENDING  Incomplete    Labs: CBC: Recent Labs  Lab 08/08/23 0822 08/09/23 0503 08/10/23 0502 08/11/23 0431 08/12/23 1019  WBC 7.8 6.7 6.0 6.8 7.4  NEUTROABS 6.2 5.0 3.8 3.9 4.5  HGB 9.3* 10.1* 10.2* 9.5* 10.8*  HCT 28.8* 30.1* 30.9* 28.8* 33.7*  MCV 86.7 84.3 84.0 85.0 86.6  PLT 239 240 236 245 276   Basic Metabolic Panel: Recent  Labs  Lab 08/06/23 2344 08/07/23 0208 08/08/23 0531 08/09/23 0503 08/10/23 0502 08/10/23 1153 08/11/23 0431 08/12/23 1019 08/13/23 0937  NA  --  137 139 138 135 138 139 139 139  K  --  3.7 3.6 3.2* 3.0* 3.4* 3.1* 3.7 3.6  CL  --  101 104 103 102 106 105 105 103  CO2  --  19* 20* 22 25 21* 24 21* 27  GLUCOSE  --  186* 148* 140* 280* 301* 160* 174* 207*  BUN  --  12 16 13 10 10  7* 5* 5*  CREATININE  --  1.02 1.05 0.87 0.82 0.82 0.74 0.74 0.88  CALCIUM   --  8.2* 8.5* 8.2* 8.3* 8.5* 8.3* 8.9 9.2  MG 1.0* 2.2 1.7 1.7 1.8  --  2.0 1.8  --   PHOS 4.2 4.4  --  2.0* 2.5  --  2.2*  --   --    Liver Function Tests: Recent Labs  Lab 08/09/23 0503 08/10/23 0502 08/11/23 0431 08/12/23 1019 08/13/23 0937  AST 31 27 20 26 25   ALT 16 18 14 14 15   ALKPHOS 46 52 44 54 63  BILITOT 1.7* 1.2 1.2 1.4* 1.4*  PROT 5.1* 5.0* 4.9* 5.5* 5.8*  ALBUMIN 2.3* 2.3* 2.3* 2.7* 2.8*   CBG: Recent Labs  Lab 08/12/23 1149 08/12/23 1628 08/12/23 2106 08/13/23 0749 08/13/23 1212  GLUCAP 236* 283* 280* 172* 303*    Discharge time spent: greater than 30 minutes.  Signed: Danette Duos, MD [facility] 08/13/2023

## 2023-08-13 NOTE — Progress Notes (Addendum)
 Patient arrived to room and oriented to unit flow. Urine leaking around foley noted in heavy stream by bedside nurse and reported to on-call MD, orders given.   Spouse called with questions. Primary concern is urology study patient is scheduled for 5/6 - wants to make sure patient is able to attend study and urologist told her could not be done while inpatient.

## 2023-08-13 NOTE — H&P (Signed)
 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  He presented to [facility] on 08/06/2023 due to these persistent problems.  Urinalysis was concerning for signs of infection and the patient was ultimately found to have E. Coli and Klebsiella UTI with sepsis.  Further imaging was also concerning for cholelithiasis as well with HIDA scan showing chronic cholecystitis and biliary akinesis.  Patient was placed on IV Ancef  for treatment of his urinary tract infection.  Blood cultures remain negative to date.  Patient was evaluated by urology on 08/07/2023 who did not recommend a Foley exchange.  On 08/10/2023 patient experienced a hypotensive episode with associated diaphoresis.  EKG demonstrated T wave changes and elevated troponin was found as well consistent with a NSTEMI.  Patient was taken for a cardiac cath which demonstrated a left dominant coronary arteries with significant three-vessel coronary artery disease.  The patient underwent angioplasty and drug-eluting stent placement to the mid LAD.  He was started on Plavix  and aspirin .  He may discontinue aspirin  once Eliquis  is resumed.  Patient was also placed on carvedilol  and spironolactone  by cardiology.  As far as his cholelithiasis and cholecystitis a HIDA scan on 08/08/2023 demonstrated a ejection fraction of 4% consistent with chronic cholecystitis and biliary dyskinesia.  Surgery had initially planned for a cholecystectomy but given his MI the plan now is for elective cholecystectomy in about 6 months.   Recommendation was 1 week with antibiotics per surgery.  Patient also been dealing with a chronic sacral wound which his wife had been managing at home.  The patient told me it was essentially resolved.  Given the patient's medical history and subsequent weakness as a result he was seen by therapies and is demonstrated functional and physical needs.  Physical medicine rehab was consulted for assessment and determined that the patient could potentially benefit from an inpatient rehab admission to address these deficits.  Patient was ultimately admitted today.   Review of Systems  Constitutional:  Positive for malaise/fatigue.  HENT:  Negative for hearing loss.   Eyes: Negative.   Respiratory:  Positive for cough and shortness of breath.   Cardiovascular:  Positive for chest pain.  Gastrointestinal:  Negative for nausea and vomiting.  Genitourinary:        Chronic foley  Musculoskeletal:  Positive for falls.  Skin: Negative.   Neurological:  Positive for dizziness and weakness.  Psychiatric/Behavioral:         Frustration over multiple medical issues.         Past Medical History:  Diagnosis Date   BPH (benign prostatic hyperplasia)     CTS (carpal tunnel syndrome)     Depression     Diabetes mellitus     Essential hypertension     GERD (gastroesophageal reflux disease)     History of breast cancer     Hypercholesteremia     Neuropathy               Past Surgical History:  Procedure Laterality Date   CATARACT EXTRACTION, BILATERAL   01/18/2017   CORONARY  STENT INTERVENTION N/A 08/10/2023    Procedure: CORONARY STENT INTERVENTION;  Surgeon: Wenona Hamilton, MD;  Location: [facility] INVASIVE CV LAB;  Service: Cardiovascular;  Laterality: N/A;   LEFT HEART CATH AND CORONARY ANGIOGRAPHY N/A 08/10/2023    Procedure: LEFT HEART CATH AND CORONARY ANGIOGRAPHY;  Surgeon: Wenona Hamilton, MD;  Location: [facility] INVASIVE CV LAB;  Service: Cardiovascular;  Laterality: N/A;   LITHOTRIPSY        MASTECTOMY Right 05/02/2018   PACEMAKER IMPLANT N/A 05/13/2023    Procedure: PACEMAKER IMPLANT;  Surgeon: Ardeen Kohler, MD;  Location: [facility] INVASIVE CV LAB;  Service: Cardiovascular;  Laterality: N/A;   TEMPORARY PACEMAKER N/A 05/11/2023    Procedure: TEMPORARY PACEMAKER;  Surgeon: Swazil[place], Peter M, MD;  Location: [facility] INVASIVE CV LAB;  Service: Cardiovascular;  Laterality: N/A;   TONSILLECTOMY AND ADENOIDECTOMY                 Family History  Problem Relation Age of Onset   Lymphoma Mother     Cancer Mother     Diabetes Father     Stroke Father     Ovarian cancer Sister     Cancer Sister     Cancer Paternal Grandmother          Social History:  reports that he has never smoked. He has never used smokeless tobacco. He reports that he does not drink alcohol and does not use drugs. Allergies:  Allergies       Allergies  Allergen Reactions   Ramipril Anaphylaxis   Other Diarrhea      Severe intolerance to Chemotherapy in the past.   Sertraline Other (See Comments)      "Extreme headaches"   Adhesive [Tape] Rash            Medications Prior to Admission  Medication Sig Dispense Refill   acetaminophen  (TYLENOL ) 650 MG CR tablet Take 1 tablet (650 mg total) by mouth every 8 (eight) hours as needed for pain.       apixaban  (ELIQUIS ) 5 MG TABS tablet Take 1 tablet (5 mg total) by mouth 2 (two) times daily. 180 tablet 1   Ascorbic Acid  (VITAMIN C) 1000 MG tablet Take 1,000 mg by mouth 3 (three) times a week.       atorvastatin  (LIPITOR ) 80 MG tablet Take 1 tablet (80 mg total) by mouth at bedtime. 90 tablet 3   buPROPion  (WELLBUTRIN ) 75 MG tablet Take 1 tablet (75 mg total) by mouth 2 (two) times daily. 180 tablet 1   clotrimazole -betamethasone  (LOTRISONE ) cream Apply 1 Application topically daily. 30 g 0   empagliflozin  (JARDIANCE ) 25 MG TABS tablet Take 1 tablet (25 mg total) by mouth daily. 30 tablet 2   finasteride  (PROSCAR ) 5 MG tablet Take 1 tablet (5 mg total) by mouth daily.  30 tablet 0   metFORMIN  (GLUCOPHAGE -XR) 500 MG 24 hr tablet Take 2 tablets (1,000 mg total) by mouth 2 (two) times daily with a meal. 120 tablet 0   Multiple Vitamin (MULTIVITAMIN) tablet Take 1 tablet by mouth 2 (two) times a week.       pantoprazole  (PROTONIX ) 40 MG tablet Take 1 tablet (40 mg total) by mouth daily. (Take in place of omeprazole  while taking clopidogrel .) 90 tablet 1   polyethylene glycol (MIRALAX  / GLYCOLAX ) 17 g packet Take 17 g by mouth 2 (two) times daily. (Patient taking differently: Take 17 g by mouth 2 (two) times daily as needed for moderate constipation.) 14 each 0  promethazine  (PHENERGAN ) 25 MG tablet Take 0.5-1 tablets (12.5-25 mg total) by mouth every 8 (eight) hours as needed for nausea or vomiting. 20 tablet 0   Triamcinolone  Acetonide (TRIAMCINOLONE  0.1 % CREAM : EUCERIN) CREA Apply 1 Application topically 2 (two) times daily. 1 each 0   Blood Glucose Monitoring Suppl (ACCU-CHEK GUIDE) w/Device KIT Use daily to check blood sugar.  DX E11.69 1 kit 0   Lancets Misc. (ACCU-CHEK SOFTCLIX LANCET DEV) KIT Use daily to check blood sugar.  DX E11.69       tamsulosin  (FLOMAX ) 0.4 MG CAPS capsule Take 1 capsule (0.4 mg total) by mouth daily. (Patient not taking: Reported on 08/07/2023) 30 capsule 0              Home: Home Living Family/patient expects to be discharged to:: Private residence Living Arrangements: Spouse/significant other Available Help at Discharge: Family, Available 24 hours/day Type of Home: House Home Access: Stairs to enter [employer] of Steps: 3 Entrance Stairs-Rails: Right, Left, Can reach both Home Layout: Two level, Able to live on main level with bedroom/bathroom Alternate Level Stairs-Number of Steps: 14 Alternate Level Stairs-Rails: Right, Left, Can reach both Bathroom Shower/Tub: Walk-in shower, Curtain Bathroom Toilet: Handicapped height Bathroom Accessibility: No Home Equipment: Agricultural consultant (2 wheels), BSC/3in1, Psychiatric nurse, Retail buyer  Lives With: Spouse   Functional History: Prior Function Prior Level of Function : Needs assist, History of Falls (last six months) Physical Assist : ADLs (physical), Mobility (physical) Mobility (physical): Gait, Stairs ADLs (physical): Bathing, Dressing, IADLs Mobility Comments: Pt reports his wife makes him use a RW and if she is not present he doesn't use an AD. Pt stated he hasn't been upstairs in 3 months as his wife won't let him go up there, but also reports he "snuck upstairs" when wife wasn't there to use upstairs shower. Pt has had at least 4 falls in the last 52mo d/t LOB and feeling dizzy. Pt reports sitting in a recliner chair often. (Per chart review pt d/c from CIR 1/26 at CGA/SBA level for transfers and ambulation with RW.) ADLs Comments: Pt Independent with self-feeding and grooming. Pt reports bathing, dressing, and toileting with Supervision from wife for safety. Pt also reports use of shower chair, which he reports he dislikes but his wife "makes me use it." Pt's wife assists with IADLs. Pt is a retired Passenger transport manager. (Per chart review pt d/c from CIR 1/26 at CGA/SBA level for transfers and ambulation with RW.)   Functional Status:  Mobility: Bed Mobility Overal bed mobility: Needs Assistance Bed Mobility: Supine to Sit Supine to sit: Min assist, HOB elevated Sit to supine: Contact guard assist General bed mobility comments: Assist to elevate trunk to upright, increased time to return to supine Transfers Overall transfer level: Needs assistance Equipment used: Rolling walker (2 wheels) Transfers: Sit to/from Stand Sit to Stand: Contact guard assist Bed to/from chair/wheelchair/BSC transfer type:: Step pivot Step pivot transfers: Contact guard assist General transfer comment: Slow to rise and achieve upright positioning, CGA for safety Ambulation/Gait Ambulation/Gait assistance: Min assist Gait Distance (Feet): 150 Feet Assistive device:  Rolling walker (2 wheels) Gait Pattern/deviations: Trunk flexed, Step-through pattern, Decreased stride length General Gait Details: Slow pace, increased cervical flexion, able to perform head turns to R/L and dual tasking. Up to minA for turns and with fatigue Gait velocity: 0.93 ft/s Gait velocity interpretation: <1.31 ft/sec, indicative of household ambulator   ADL: ADL Overall ADL's : (P) Needs assistance/impaired Eating/Feeding: Set up,  Sitting Grooming: Set up, Sitting Upper Body Bathing: Set up, Supervision/ safety, Sitting Lower Body Bathing: Minimal assistance, Cueing for safety, Cueing for compensatory techniques, Sit to/from stand Upper Body Dressing : Contact guard assist, Sitting (assist for line management) Lower Body Dressing: Moderate assistance, Sitting/lateral leans, Cueing for compensatory techniques Lower Body Dressing Details (indicate cue type and reason): pt. with notable post. lob.  states he usually is seated on toilet when he completes majority of LB ADLs.  laying all the way back on side of bed with pillow support on lower opposing bed rail for safety and support while he attempted to don disposable briefs.  pain in L hip limiting ROM for attempted figure four approach.  will benefit from A/E introduction next session Toilet Transfer: Ambulation, Rolling walker (2 wheels), Minimal assistance Toilet Transfer Details (indicate cue type and reason): simulated with ambulation around the bed and then back around bed and back to eob.  initial cues for hand placement to push up from eob but good carry over for the remaining portion of the session Toileting- Clothing Manipulation and Hygiene: Minimal assistance, Sit to/from stand, Cueing for safety, Cueing for compensatory techniques Tub/ Shower Transfer: Minimal assistance Functional mobility during ADLs: Contact guard assist, Minimal assistance General ADL Comments: cues for RW management.    Cognition: Cognition Orientation Level: Oriented X4 Cognition Arousal: Alert Behavior During Therapy: WFL for tasks assessed/performed   Physical Exam: Blood pressure 129/72, pulse 74, temperature 98 F (36.7 C), temperature source Oral, resp. rate (!) 24, height 6' (1.829 m), weight 91.7 kg, SpO2 96%. Physical Exam Constitutional:      General: He is not in acute distress.    Appearance: He is obese.  HENT:     Head: Normocephalic.     Right Ear: External ear normal.     Left Ear: External ear normal.     Mouth/Throat:     Mouth: Mucous membranes are moist.     Pharynx: No oropharyngeal exudate.  Eyes:     Extraocular Movements: Extraocular movements intact.     Conjunctiva/sclera: Conjunctivae normal.     Pupils: Pupils are equal, round, and reactive to light.  Cardiovascular:     Rate and Rhythm: Normal rate and regular rhythm.     Heart sounds: Murmur heard.  Pulmonary:     Effort: Pulmonary effort is normal. No respiratory distress.     Breath sounds: Normal breath sounds. No wheezing.  Abdominal:     General: Bowel sounds are normal. There is no distension.     Palpations: Abdomen is soft.     Tenderness: There is no abdominal tenderness.  Genitourinary:    Comments: Foley with clear urine Musculoskeletal:        General: No swelling or tenderness.     Cervical back: Normal range of motion.  Skin:    Comments: Pacer in upper left chest  Neurological:     Mental Status: He is alert.     Comments: Alert and oriented x 3. Normal insight and awareness. Intact Memory. Normal language and speech. Cranial nerve exam unremarkable. MMT: BUE 4/5 deltoids, 4+/5 biceps, triceps, wrist, hands. BLE 4/5 HF,KE and 4+/5 ADF/PF. Sensory exam normal for light touch and pain in all 4 limbs. No limb ataxia or cerebellar signs. No abnormal tone appreciated.  Aaron Aas    Psychiatric:        Mood and Affect: Mood normal.        Behavior: Behavior normal.  Lab Results Last 48 Hours         Results for orders placed or performed during the hospital encounter of 08/06/23 (from the past 48 hours)  Glucose, capillary     Status: Abnormal    Collection Time: 08/11/23  4:42 PM  Result Value Ref Range    Glucose-Capillary 263 (H) 70 - 99 mg/dL      Comment: Glucose reference range applies only to samples taken after fasting for at least 8 hours.  Glucose, capillary     Status: Abnormal    Collection Time: 08/11/23  8:36 PM  Result Value Ref Range    Glucose-Capillary 308 (H) 70 - 99 mg/dL      Comment: Glucose reference range applies only to samples taken after fasting for at least 8 hours.  Glucose, capillary     Status: Abnormal    Collection Time: 08/12/23  7:32 AM  Result Value Ref Range    Glucose-Capillary 157 (H) 70 - 99 mg/dL      Comment: Glucose reference range applies only to samples taken after fasting for at least 8 hours.  CBC with Differential/Platelet     Status: Abnormal    Collection Time: 08/12/23 10:19 AM  Result Value Ref Range    WBC 7.4 4.0 - 10.5 K/uL    RBC 3.89 (L) 4.22 - 5.81 MIL/uL    Hemoglobin 10.8 (L) 13.0 - 17.0 g/dL    HCT 14.7 (L) 82.9 - 52.0 %    MCV 86.6 80.0 - 100.0 fL    MCH 27.8 26.0 - 34.0 pg    MCHC 32.0 30.0 - 36.0 g/dL    RDW 56.2 (H) 13.0 - 15.5 %    Platelets 276 150 - 400 K/uL    nRBC 0.3 (H) 0.0 - 0.2 %    Neutrophils Relative % 61 %    Neutro Abs 4.5 1.7 - 7.7 K/uL    Lymphocytes Relative 24 %    Lymphs Abs 1.7 0.7 - 4.0 K/uL    Monocytes Relative 7 %    Monocytes Absolute 0.5 0.1 - 1.0 K/uL    Eosinophils Relative 5 %    Eosinophils Absolute 0.3 0.0 - 0.5 K/uL    Basophils Relative 1 %    Basophils Absolute 0.1 0.0 - 0.1 K/uL    Immature Granulocytes 2 %    Abs Immature Granulocytes 0.17 (H) 0.00 - 0.07 K/uL      Comment: Performed at [facility], [address] [place], [place] [address]  Comprehensive metabolic panel with GFR     Status: Abnormal    Collection Time: 08/12/23 10:19 AM  Result Value  Ref Range    Sodium 139 135 - 145 mmol/L    Potassium 3.7 3.5 - 5.1 mmol/L    Chloride 105 98 - 111 mmol/L    CO2 21 (L) 22 - 32 mmol/L    Glucose, Bld 174 (H) 70 - 99 mg/dL      Comment: Glucose reference range applies only to samples taken after fasting for at least 8 hours.    BUN 5 (L) 8 - 23 mg/dL    Creatinine, Ser 4.69 0.61 - 1.24 mg/dL    Calcium  8.9 8.9 - 10.3 mg/dL    Total Protein 5.5 (L) 6.5 - 8.1 g/dL    Albumin 2.7 (L) 3.5 - 5.0 g/dL    AST 26 15 - 41 U/L    ALT 14 0 - 44 U/L  Alkaline Phosphatase 54 38 - 126 U/L    Total Bilirubin 1.4 (H) 0.0 - 1.2 mg/dL    GFR, Estimated >82 >95 mL/min      Comment: (NOTE) Calculated using the CKD-EPI Creatinine Equation (2021)      Anion gap 13 5 - 15      Comment: Performed at [facility], [address], [place], [place] [address]  Magnesium      Status: None    Collection Time: 08/12/23 10:19 AM  Result Value Ref Range    Magnesium  1.8 1.7 - 2.4 mg/dL      Comment: Performed at [facility], [address], [place], [place] [address]  Glucose, capillary     Status: Abnormal    Collection Time: 08/12/23 11:49 AM  Result Value Ref Range    Glucose-Capillary 236 (H) 70 - 99 mg/dL      Comment: Glucose reference range applies only to samples taken after fasting for at least 8 hours.  Glucose, capillary     Status: Abnormal    Collection Time: 08/12/23  4:28 PM  Result Value Ref Range    Glucose-Capillary 283 (H) 70 - 99 mg/dL      Comment: Glucose reference range applies only to samples taken after fasting for at least 8 hours.  Glucose, capillary     Status: Abnormal    Collection Time: 08/12/23  9:06 PM  Result Value Ref Range    Glucose-Capillary 280 (H) 70 - 99 mg/dL      Comment: Glucose reference range applies only to samples taken after fasting for at least 8 hours.  Glucose, capillary     Status: Abnormal    Collection Time: 08/13/23  7:49 AM  Result Value Ref Range    Glucose-Capillary 172 (H) 70 -  99 mg/dL      Comment: Glucose reference range applies only to samples taken after fasting for at least 8 hours.  Comprehensive metabolic panel with GFR     Status: Abnormal    Collection Time: 08/13/23  9:37 AM  Result Value Ref Range    Sodium 139 135 - 145 mmol/L    Potassium 3.6 3.5 - 5.1 mmol/L    Chloride 103 98 - 111 mmol/L    CO2 27 22 - 32 mmol/L    Glucose, Bld 207 (H) 70 - 99 mg/dL      Comment: Glucose reference range applies only to samples taken after fasting for at least 8 hours.    BUN 5 (L) 8 - 23 mg/dL    Creatinine, Ser 4.69 0.61 - 1.24 mg/dL    Calcium  9.2 8.9 - 10.3 mg/dL    Total Protein 5.8 (L) 6.5 - 8.1 g/dL    Albumin 2.8 (L) 3.5 - 5.0 g/dL    AST 25 15 - 41 U/L    ALT 15 0 - 44 U/L    Alkaline Phosphatase 63 38 - 126 U/L    Total Bilirubin 1.4 (H) 0.0 - 1.2 mg/dL    GFR, Estimated >62 >95 mL/min      Comment: (NOTE) Calculated using the CKD-EPI Creatinine Equation (2021)      Anion gap 9 5 - 15      Comment: Performed at [facility], [address] [place], [place] [address]  Glucose, capillary     Status: Abnormal    Collection Time: 08/13/23 12:12 PM  Result Value Ref Range    Glucose-Capillary 303 (H) 70 -  99 mg/dL      Comment: Glucose reference range applies only to samples taken after fasting for at least 8 hours.      Imaging Results (Last 48 hours)  No results found.         Blood pressure 129/72, pulse 74, temperature 98 F (36.7 C), temperature source Oral, resp. rate (!) 24, height 6' (1.829 m), weight 91.7 kg, SpO2 96%.   Medical Problem List and Plan: 1. Functional deficits secondary to debility after urosepsis, NSTEMI             -pt is DNR-limited             -patient may  shower             -ELOS/Goals: 7-9 days, goals mod I to supervision 2.  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 Pain Management: fairly well  controlled at present.              -tylenol  prn 4. Mood/Behavior/Sleep: team provide ego support             -trazodone  prn for sleep             -antipsychotic agents: n/a 5. Neuropsych/cognition: This patient is capable of making decisions on his own behalf. 6. 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 Fluids/Electrolytes/Nutrition: pt's intake has been inconsistent             -review importance of po intake with pt             -will ask RD to follow along as well             -heart healthy diet ordered. Might liberalize             -recheck labs Monday  8. Urosepsis/ E Coli bacteremia             -pt on ancef  day 5, blood cx negative so far             -begin augmentin  per ID and continue for 8 days for a total of 2 weeks of antibiotics 9. BPH with bladder outlet obstruction             -chronic foley, urology consulted on acute             -pt will see urology as outpt in follow up             -flush catheter as needed for obstruction or any leakage             -flomax , proscar              -hyoscyamine  for bladder spasms 10. NSTEMI/CAD             -pt with 3 vessel disease, s/p angioplasty/stent LAD             -plavix , no asa as on eliquis  for DVT             -continue carvedilol ,  spironolactone              -monitor bps, weights and with increased activity during therapy 11. Cholelithiasis, biliary diskynesia, chronic cholecystitis             -Cholecystectomy postponed d/t MI             -elective chole in 6 mos             -abx coverage x 1 week (augmentin ) 12. HTN             -bp's currently controlled             -only on carvedilol  13. Chronic anemia             -ferrous sulfate  supplement       Rawland Caddy, MD 08/13/2023   I  have personally performed a face to face diagnostic evaluation of this patient and formulated the key components of the plan.  Additionally, I have personally reviewed laboratory data, imaging studies, as well as relevant notes.  The patient's status has not changed from the original H&P.  Any changes in documentation from the acute care chart have been noted above.  Rawland Caddy, MD, [place]

## 2023-08-13 NOTE — Progress Notes (Addendum)
   Palliative Medicine Inpatient Follow Up Note HPI: John [name] is a 78 year old male with PMH BPH s/p Foley placement, depression, HTN, DM II, HLD, neuropathy who presented with worsening weakness, lethargy, and recurrent falls at home.    Palliative care has been asked to support additional goals of care conversations.   Today's Discussion 08/13/2023  *Please note that this is a verbal dictation therefore any spelling or grammatical errors are due to the "Dragon Medical One" system interpretation.  Chart reviewed inclusive of vital signs, progress notes, laboratory results, and diagnostic images.   I met with John [name] at bedside this morning.  He shares frustration that he has not been able to get his gallbladder removal surgery. He notes that this will be held off on for six months in the setting of him having suffered an NSTEMI.   Allowed Tip time to express his frustrations with his present medical disease burden.   Discussed that patients goals at this time are to re-gain strength. He plans to transition to rehabilitation - CIR this afternoon.  Questions and concerns addressed/Palliative Support Provided.   Objective Assessment: Vital Signs Vitals:   08/13/23 0630 08/13/23 0806  BP: (!) 146/80 129/72  Pulse: 76 74  Resp: 20 (!) 24  Temp: 98.5 F (36.9 C) 98 F (36.7 C)  SpO2: 96% 96%    Intake/Output Summary (Last 24 hours) at 08/13/2023 1113 Last data filed at 08/13/2023 5409 Gross per 24 hour  Intake --  Output 2000 ml  Net -2000 ml   Last Weight  Most recent update: 08/08/2023  4:55 AM    Weight  91.7 kg (202 lb 2.6 oz)            Gen:  Elderly Caucasian M chronically ill appearing HEENT: Dry mucous membranes CV: Regular rate and rhythm  PULM:  On RA, breathing is even and nonlabored ABD: soft/nontender  EXT: No edema  Neuro: Alert and oriented x3  SUMMARY OF RECOMMENDATIONS   DNAR/DNI   MOST / DNR Form Completed, paper copy placed onto the chart electric copy  can be found in Vynca   Plan to transition to CIR  Billing based on MDM: Moderate ______________________________________________________________________________________ Camille C[facility] [place] Palliative Medicine Team Team Cell Phone: [phone] Please utilize secure chat with additional questions, if there is no response within 30 minutes please call the above phone number  Palliative Medicine Team providers are available by phone from 7am to 7pm daily and can be reached through the team cell phone.  Should this patient require assistance outside of these hours, please call the patient's attending physician.

## 2023-08-13 NOTE — H&P (Addendum)
 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  He presented to [facility] on 08/06/2023 due to these persistent problems.  Urinalysis was concerning for signs of infection and the patient was ultimately found to have E. Coli and Klebsiella UTI with sepsis.  Further imaging was also concerning for cholelithiasis as well with HIDA scan showing chronic cholecystitis and biliary akinesis.  Patient was placed on IV Ancef  for treatment of his urinary tract infection.  Blood cultures remain negative to date.  Patient was evaluated by urology on 08/07/2023 who did not recommend a Foley exchange.  On 08/10/2023 patient experienced a hypotensive episode with associated diaphoresis.  EKG demonstrated T wave changes and elevated troponin was found as well consistent with a NSTEMI.  Patient was taken for a cardiac cath which demonstrated a left dominant coronary arteries with significant three-vessel coronary artery disease.  The patient underwent angioplasty and drug-eluting stent placement to the mid LAD.  He was started on Plavix  and aspirin .  He may discontinue aspirin  once Eliquis  is resumed.  Patient was also placed on carvedilol  and spironolactone  by cardiology.  As far as his cholelithiasis and cholecystitis a HIDA scan on 08/08/2023 demonstrated a ejection fraction of 4% consistent with chronic cholecystitis and biliary dyskinesia.  Surgery had initially planned for a cholecystectomy but given his MI the plan now is for elective cholecystectomy in about 6 months.   Recommendation was 1 week with antibiotics per surgery.  Patient also been dealing with a chronic sacral wound which his wife had been managing at home.  The patient told me it was essentially resolved.  Given the patient's medical history and subsequent weakness as a result he was seen by therapies and is demonstrated functional and physical needs.  Physical medicine rehab was consulted for assessment and determined that the patient could potentially benefit from an inpatient rehab admission to address these deficits.  Patient was ultimately admitted today.  Review of Systems  Constitutional:  Positive for malaise/fatigue.  HENT:  Negative for hearing loss.   Eyes: Negative.   Respiratory:  Positive for cough and shortness of breath.   Cardiovascular:  Positive for chest pain.  Gastrointestinal:  Negative for nausea and vomiting.  Genitourinary:        Chronic foley  Musculoskeletal:  Positive for falls.  Skin: Negative.   Neurological:  Positive for dizziness and weakness.  Psychiatric/Behavioral:         Frustration over multiple medical issues.    Past Medical History:  Diagnosis Date   BPH (benign prostatic hyperplasia)    CTS (carpal tunnel syndrome)    Depression    Diabetes mellitus    Essential hypertension    GERD (gastroesophageal reflux disease)    History of breast cancer    Hypercholesteremia    Neuropathy    Past Surgical History:  Procedure Laterality Date   CATARACT EXTRACTION, BILATERAL  01/18/2017   CORONARY STENT INTERVENTION N/A 08/10/2023   Procedure: CORONARY STENT INTERVENTION;  Surgeon: Wenona Hamilton, MD;  Location: [facility] INVASIVE CV LAB;  Service: Cardiovascular;  Laterality: N/A;   LEFT HEART CATH AND CORONARY ANGIOGRAPHY N/A 08/10/2023   Procedure: LEFT HEART CATH AND CORONARY ANGIOGRAPHY;  Surgeon: Wenona Hamilton, MD;  Location: [facility] INVASIVE CV LAB;  Service: Cardiovascular;  Laterality: N/A;   LITHOTRIPSY     MASTECTOMY Right 05/02/2018   PACEMAKER  IMPLANT N/A 05/13/2023   Procedure: PACEMAKER IMPLANT;  Surgeon: Ardeen Kohler, MD;  Location: [facility] INVASIVE CV LAB;  Service: Cardiovascular;  Laterality: N/A;   TEMPORARY PACEMAKER N/A 05/11/2023   Procedure: TEMPORARY PACEMAKER;  Surgeon: Swazil[place], Peter M, MD;  Location: [facility] INVASIVE CV LAB;  Service: Cardiovascular;  Laterality: N/A;   TONSILLECTOMY AND ADENOIDECTOMY     Family History  Problem Relation Age of Onset   Lymphoma Mother    Cancer Mother    Diabetes Father    Stroke Father    Ovarian cancer Sister    Cancer Sister    Cancer Paternal Grandmother    Social History:  reports that he has never smoked. He has never used smokeless tobacco. He reports that he does not drink alcohol and does not use drugs. Allergies:  Allergies  Allergen Reactions   Ramipril Anaphylaxis   Other Diarrhea    Severe intolerance to Chemotherapy in the past.   Sertraline Other (See Comments)    "Extreme headaches"   Adhesive [Tape] Rash   Medications Prior to Admission  Medication Sig Dispense Refill   acetaminophen  (TYLENOL ) 650 MG CR tablet Take 1 tablet (650 mg total) by mouth every 8 (eight) hours as needed for pain.     apixaban  (ELIQUIS ) 5 MG TABS tablet Take 1 tablet (5 mg total) by mouth 2 (two) times daily. 180 tablet 1   Ascorbic Acid  (VITAMIN C) 1000 MG tablet Take 1,000 mg by mouth 3 (three) times a week.     atorvastatin  (LIPITOR ) 80 MG tablet Take 1 tablet (80 mg total) by mouth at bedtime. 90 tablet 3   buPROPion  (WELLBUTRIN ) 75 MG tablet Take 1 tablet (75 mg total) by mouth 2 (two) times daily. 180 tablet 1   clotrimazole -betamethasone  (LOTRISONE ) cream Apply 1 Application topically daily. 30 g 0   empagliflozin  (JARDIANCE ) 25 MG TABS tablet Take 1 tablet (25 mg total) by mouth daily. 30 tablet 2   finasteride  (PROSCAR ) 5 MG tablet Take 1 tablet (5 mg total) by mouth daily. 30 tablet 0   metFORMIN  (GLUCOPHAGE -XR) 500 MG 24 hr tablet Take 2 tablets (1,000 mg total) by mouth 2 (two)  times daily with a meal. 120 tablet 0   Multiple Vitamin (MULTIVITAMIN) tablet Take 1 tablet by mouth 2 (two) times a week.     pantoprazole  (PROTONIX ) 40 MG tablet Take 1 tablet (40 mg total) by mouth daily. (Take in place of omeprazole  while taking clopidogrel .) 90 tablet 1   polyethylene glycol (MIRALAX  / GLYCOLAX ) 17 g packet Take 17 g by mouth 2 (two) times daily. (Patient taking differently: Take 17 g by mouth 2 (two) times daily as needed for moderate constipation.) 14 each 0   promethazine  (PHENERGAN ) 25 MG tablet Take 0.5-1 tablets (12.5-25 mg total) by mouth every 8 (eight) hours as needed for nausea or vomiting. 20 tablet 0   Triamcinolone  Acetonide (TRIAMCINOLONE  0.1 % CREAM : EUCERIN) CREA Apply 1 Application topically 2 (two) times daily. 1 each 0   Blood Glucose Monitoring Suppl (ACCU-CHEK GUIDE) w/Device KIT Use daily to check blood sugar.  DX E11.69 1 kit 0   Lancets Misc. (ACCU-CHEK SOFTCLIX LANCET DEV) KIT Use  daily to check blood sugar.  DX E11.69     tamsulosin  (FLOMAX ) 0.4 MG CAPS capsule Take 1 capsule (0.4 mg total) by mouth daily. (Patient not taking: Reported on 08/07/2023) 30 capsule 0      Home: Home Living Family/patient expects to be discharged to:: Private residence Living Arrangements: Spouse/significant other Available Help at Discharge: Family, Available 24 hours/day Type of Home: House Home Access: Stairs to enter [employer] of Steps: 3 Entrance Stairs-Rails: Right, Left, Can reach both Home Layout: Two level, Able to live on main level with bedroom/bathroom Alternate Level Stairs-Number of Steps: 14 Alternate Level Stairs-Rails: Right, Left, Can reach both Bathroom Shower/Tub: Walk-in shower, Curtain Bathroom Toilet: Handicapped height Bathroom Accessibility: No Home Equipment: Agricultural consultant (2 wheels), BSC/3in1, Information systems manager, Retail buyer  Lives With: Spouse   Functional History: Prior Function Prior Level of Function : Needs assist,  History of Falls (last six months) Physical Assist : ADLs (physical), Mobility (physical) Mobility (physical): Gait, Stairs ADLs (physical): Bathing, Dressing, IADLs Mobility Comments: Pt reports his wife makes him use a RW and if she is not present he doesn't use an AD. Pt stated he hasn't been upstairs in 3 months as his wife won't let him go up there, but also reports he "snuck upstairs" when wife wasn't there to use upstairs shower. Pt has had at least 4 falls in the last 40mo d/t LOB and feeling dizzy. Pt reports sitting in a recliner chair often. (Per chart review pt d/c from CIR 1/26 at CGA/SBA level for transfers and ambulation with RW.) ADLs Comments: Pt Independent with self-feeding and grooming. Pt reports bathing, dressing, and toileting with Supervision from wife for safety. Pt also reports use of shower chair, which he reports he dislikes but his wife "makes me use it." Pt's wife assists with IADLs. Pt is a retired Passenger transport manager. (Per chart review pt d/c from CIR 1/26 at CGA/SBA level for transfers and ambulation with RW.)  Functional Status:  Mobility: Bed Mobility Overal bed mobility: Needs Assistance Bed Mobility: Supine to Sit Supine to sit: Min assist, HOB elevated Sit to supine: Contact guard assist General bed mobility comments: Assist to elevate trunk to upright, increased time to return to supine Transfers Overall transfer level: Needs assistance Equipment used: Rolling walker (2 wheels) Transfers: Sit to/from Stand Sit to Stand: Contact guard assist Bed to/from chair/wheelchair/BSC transfer type:: Step pivot Step pivot transfers: Contact guard assist General transfer comment: Slow to rise and achieve upright positioning, CGA for safety Ambulation/Gait Ambulation/Gait assistance: Min assist Gait Distance (Feet): 150 Feet Assistive device: Rolling walker (2 wheels) Gait Pattern/deviations: Trunk flexed, Step-through pattern, Decreased stride length General  Gait Details: Slow pace, increased cervical flexion, able to perform head turns to R/L and dual tasking. Up to minA for turns and with fatigue Gait velocity: 0.93 ft/s Gait velocity interpretation: <1.31 ft/sec, indicative of household ambulator    ADL: ADL Overall ADL's : (P) Needs assistance/impaired Eating/Feeding: Set up, Sitting Grooming: Set up, Sitting Upper Body Bathing: Set up, Supervision/ safety, Sitting Lower Body Bathing: Minimal assistance, Cueing for safety, Cueing for compensatory techniques, Sit to/from stand Upper Body Dressing : Contact guard assist, Sitting (assist for line management) Lower Body Dressing: Moderate assistance, Sitting/lateral leans, Cueing for compensatory techniques Lower Body Dressing Details (indicate cue type and reason): pt. with notable post. lob.  states he usually is seated on toilet when he completes majority of LB ADLs.  laying all the way back on side of bed  with pillow support on lower opposing bed rail for safety and support while he attempted to don disposable briefs.  pain in L hip limiting ROM for attempted figure four approach.  will benefit from A/E introduction next session Toilet Transfer: Ambulation, Rolling walker (2 wheels), Minimal assistance Toilet Transfer Details (indicate cue type and reason): simulated with ambulation around the bed and then back around bed and back to eob.  initial cues for hand placement to push up from eob but good carry over for the remaining portion of the session Toileting- Clothing Manipulation and Hygiene: Minimal assistance, Sit to/from stand, Cueing for safety, Cueing for compensatory techniques Tub/ Shower Transfer: Minimal assistance Functional mobility during ADLs: Contact guard assist, Minimal assistance General ADL Comments: cues for RW management.  Cognition: Cognition Orientation Level: Oriented X4 Cognition Arousal: Alert Behavior During Therapy: WFL for tasks assessed/performed  Physical  Exam: Blood pressure 129/72, pulse 74, temperature 98 F (36.7 C), temperature source Oral, resp. rate (!) 24, height 6' (1.829 m), weight 91.7 kg, SpO2 96%. Physical Exam Constitutional:      General: He is not in acute distress.    Appearance: He is obese.  HENT:     Head: Normocephalic.     Right Ear: External ear normal.     Left Ear: External ear normal.     Mouth/Throat:     Mouth: Mucous membranes are moist.     Pharynx: No oropharyngeal exudate.  Eyes:     Extraocular Movements: Extraocular movements intact.     Conjunctiva/sclera: Conjunctivae normal.     Pupils: Pupils are equal, round, and reactive to light.  Cardiovascular:     Rate and Rhythm: Normal rate and regular rhythm.     Heart sounds: Murmur heard.  Pulmonary:     Effort: Pulmonary effort is normal. No respiratory distress.     Breath sounds: Normal breath sounds. No wheezing.  Abdominal:     General: Bowel sounds are normal. There is no distension.     Palpations: Abdomen is soft.     Tenderness: There is no abdominal tenderness.  Genitourinary:    Comments: Foley with clear urine Musculoskeletal:        General: No swelling or tenderness.     Cervical back: Normal range of motion.  Skin:    Comments: Pacer in upper left chest  Neurological:     Mental Status: He is alert.     Comments: Alert and oriented x 3. Normal insight and awareness. Intact Memory. Normal language and speech. Cranial nerve exam unremarkable. MMT: BUE 4/5 deltoids, 4+/5 biceps, triceps, wrist, hands. BLE 4/5 HF,KE and 4+/5 ADF/PF. Sensory exam normal for light touch and pain in all 4 limbs. No limb ataxia or cerebellar signs. No abnormal tone appreciated.  Aaron Aas    Psychiatric:        Mood and Affect: Mood normal.        Behavior: Behavior normal.     Results for orders placed or performed during the hospital encounter of 08/06/23 (from the past 48 hours)  Glucose, capillary     Status: Abnormal   Collection Time: 08/11/23  4:42 PM   Result Value Ref Range   Glucose-Capillary 263 (H) 70 - 99 mg/dL    Comment: Glucose reference range applies only to samples taken after fasting for at least 8 hours.  Glucose, capillary     Status: Abnormal   Collection Time: 08/11/23  8:36 PM  Result Value Ref Range   Glucose-Capillary  308 (H) 70 - 99 mg/dL    Comment: Glucose reference range applies only to samples taken after fasting for at least 8 hours.  Glucose, capillary     Status: Abnormal   Collection Time: 08/12/23  7:32 AM  Result Value Ref Range   Glucose-Capillary 157 (H) 70 - 99 mg/dL    Comment: Glucose reference range applies only to samples taken after fasting for at least 8 hours.  CBC with Differential/Platelet     Status: Abnormal   Collection Time: 08/12/23 10:19 AM  Result Value Ref Range   WBC 7.4 4.0 - 10.5 K/uL   RBC 3.89 (L) 4.22 - 5.81 MIL/uL   Hemoglobin 10.8 (L) 13.0 - 17.0 g/dL   HCT 40.9 (L) 81.1 - 91.4 %   MCV 86.6 80.0 - 100.0 fL   MCH 27.8 26.0 - 34.0 pg   MCHC 32.0 30.0 - 36.0 g/dL   RDW 78.2 (H) 95.6 - 21.3 %   Platelets 276 150 - 400 K/uL   nRBC 0.3 (H) 0.0 - 0.2 %   Neutrophils Relative % 61 %   Neutro Abs 4.5 1.7 - 7.7 K/uL   Lymphocytes Relative 24 %   Lymphs Abs 1.7 0.7 - 4.0 K/uL   Monocytes Relative 7 %   Monocytes Absolute 0.5 0.1 - 1.0 K/uL   Eosinophils Relative 5 %   Eosinophils Absolute 0.3 0.0 - 0.5 K/uL   Basophils Relative 1 %   Basophils Absolute 0.1 0.0 - 0.1 K/uL   Immature Granulocytes 2 %   Abs Immature Granulocytes 0.17 (H) 0.00 - 0.07 K/uL    Comment: Performed at [facility], [address] [place], [place] [address]  Comprehensive metabolic panel with GFR     Status: Abnormal   Collection Time: 08/12/23 10:19 AM  Result Value Ref Range   Sodium 139 135 - 145 mmol/L   Potassium 3.7 3.5 - 5.1 mmol/L   Chloride 105 98 - 111 mmol/L   CO2 21 (L) 22 - 32 mmol/L   Glucose, Bld 174 (H) 70 - 99 mg/dL    Comment: Glucose reference range applies only to  samples taken after fasting for at least 8 hours.   BUN 5 (L) 8 - 23 mg/dL   Creatinine, Ser 8.46 0.61 - 1.24 mg/dL   Calcium  8.9 8.9 - 10.3 mg/dL   Total Protein 5.5 (L) 6.5 - 8.1 g/dL   Albumin 2.7 (L) 3.5 - 5.0 g/dL   AST 26 15 - 41 U/L   ALT 14 0 - 44 U/L   Alkaline Phosphatase 54 38 - 126 U/L   Total Bilirubin 1.4 (H) 0.0 - 1.2 mg/dL   GFR, Estimated >96 >29 mL/min    Comment: (NOTE) Calculated using the CKD-EPI Creatinine Equation (2021)    Anion gap 13 5 - 15    Comment: Performed at [facility], [address], [place], [place] [address]  Magnesium      Status: None   Collection Time: 08/12/23 10:19 AM  Result Value Ref Range   Magnesium  1.8 1.7 - 2.4 mg/dL    Comment: Performed at [facility], [address], [place], [place] [address]  Glucose, capillary     Status: Abnormal   Collection Time: 08/12/23 11:49 AM  Result Value Ref Range   Glucose-Capillary 236 (H) 70 - 99 mg/dL    Comment: Glucose reference range applies only to samples taken after fasting for at least 8 hours.  Glucose,  capillary     Status: Abnormal   Collection Time: 08/12/23  4:28 PM  Result Value Ref Range   Glucose-Capillary 283 (H) 70 - 99 mg/dL    Comment: Glucose reference range applies only to samples taken after fasting for at least 8 hours.  Glucose, capillary     Status: Abnormal   Collection Time: 08/12/23  9:06 PM  Result Value Ref Range   Glucose-Capillary 280 (H) 70 - 99 mg/dL    Comment: Glucose reference range applies only to samples taken after fasting for at least 8 hours.  Glucose, capillary     Status: Abnormal   Collection Time: 08/13/23  7:49 AM  Result Value Ref Range   Glucose-Capillary 172 (H) 70 - 99 mg/dL    Comment: Glucose reference range applies only to samples taken after fasting for at least 8 hours.  Comprehensive metabolic panel with GFR     Status: Abnormal   Collection Time: 08/13/23  9:37 AM  Result Value Ref Range   Sodium 139 135 - 145 mmol/L    Potassium 3.6 3.5 - 5.1 mmol/L   Chloride 103 98 - 111 mmol/L   CO2 27 22 - 32 mmol/L   Glucose, Bld 207 (H) 70 - 99 mg/dL    Comment: Glucose reference range applies only to samples taken after fasting for at least 8 hours.   BUN 5 (L) 8 - 23 mg/dL   Creatinine, Ser 1.61 0.61 - 1.24 mg/dL   Calcium  9.2 8.9 - 10.3 mg/dL   Total Protein 5.8 (L) 6.5 - 8.1 g/dL   Albumin 2.8 (L) 3.5 - 5.0 g/dL   AST 25 15 - 41 U/L   ALT 15 0 - 44 U/L   Alkaline Phosphatase 63 38 - 126 U/L   Total Bilirubin 1.4 (H) 0.0 - 1.2 mg/dL   GFR, Estimated >09 >60 mL/min    Comment: (NOTE) Calculated using the CKD-EPI Creatinine Equation (2021)    Anion gap 9 5 - 15    Comment: Performed at [facility], [address] [place], [place] [address]  Glucose, capillary     Status: Abnormal   Collection Time: 08/13/23 12:12 PM  Result Value Ref Range   Glucose-Capillary 303 (H) 70 - 99 mg/dL    Comment: Glucose reference range applies only to samples taken after fasting for at least 8 hours.   No results found.    Blood pressure 129/72, pulse 74, temperature 98 F (36.7 C), temperature source Oral, resp. rate (!) 24, height 6' (1.829 m), weight 91.7 kg, SpO2 96%.  Medical Problem List and Plan: 1. Functional deficits secondary to debility after urosepsis, NSTEMI  -pt is DNR-limited  -patient may  shower  -ELOS/Goals: 7-9 days, goals mod I to supervision 2.  Antithrombotics: -Pt with hx of Left femoral DVT (1/25)/anticoagulation:  Pharmaceutical: Eliquis   -antiplatelet therapy: plavix  3. Pain Management: fairly well controlled at present.   -tylenol  prn 4. Mood/Behavior/Sleep: team provide ego support  -trazodone  prn for sleep  -antipsychotic agents: n/a 5. Neuropsych/cognition: This patient is capable of making decisions on his own behalf. 6. 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 Fluids/Electrolytes/Nutrition: pt's intake has been inconsistent  -review importance of po intake with pt  -will ask RD to follow along as well  -heart healthy  diet ordered. Might liberalize  -recheck labs Monday  8. Urosepsis/ E Coli bacteremia  -pt on ancef  day 5, blood cx negative so far  -begin augmentin  per ID and continue for 8 days for a total of 2 weeks of antibiotics 9. BPH with bladder outlet obstruction  -chronic foley, urology consulted on acute  -pt will see urology as outpt in follow up  -flush catheter as needed for obstruction or any leakage  -flomax , proscar   -hyoscyamine  for bladder spasms 10. NSTEMI  -pt with 3 vessel disease, s/p angioplasty/stent LAD  -plavix , no asa as on eliquis  for DVT  -continue carvedilol , spironolactone   -monitor bps, weights and with increased activity during therapy 11. Cholelithiasis, biliary diskynesia, chronic cholecystitis  -Cholecystectomy postponed d/t MI  -elective chole in 6 mos  -abx coverage x 1 week (augmentin ) 12. HTN  -bp's currently controlled  -only on carvedilol  13. Chronic anemia  -ferrous sulfate  supplement    Rawland Caddy, MD 08/13/2023

## 2023-08-13 NOTE — Progress Notes (Signed)
 Physical Therapy Treatment Patient Details Name: John [name] MRN: [id number] DOB: Aug 18, 1945 Today's Date: 08/13/2023   History of Present Illness John [name] is a 78 y.o. male admitted 08/06/23 for suspected severe sepsis. Pt presented with AMS after a ground level fall at home. UA showing white and red blood cells as well as some moderate bacteria; urine cultures pending. CT abdomen showed distended gallbladder, with multiple calcified gallstones layering dependently. US  Abdomen demonstrated cholecystitis. PMH significant for T2DM, indwelling Foley catheter, GERD, BPH, HTN, HLD, s/p pacemaker, breast cancer, DVT/PE, and CVA.    PT Comments  Pt making steady progress towards his physical therapy goals, exhibiting improved activity tolerance and ambulation distance this session. Gait speed of 0.93 ft/s still indicative of high fall risk. Pt reporting tightness in bilateral gastroc and showed stretch using gait belt strap. Will continue to follow acutely.    If plan is discharge home, recommend the following: A little help with walking and/or transfers;A little help with bathing/dressing/bathroom;Assistance with cooking/housework;Assist for transportation;Help with stairs or ramp for entrance   Can travel by private vehicle        Equipment Recommendations  None recommended by PT    Recommendations for Other Services       Precautions / Restrictions Precautions Precautions: Fall Recall of Precautions/Restrictions: Intact Restrictions Weight Bearing Restrictions Per Provider Order: No     Mobility  Bed Mobility Overal bed mobility: Needs Assistance Bed Mobility: Supine to Sit     Supine to sit: Min assist, HOB elevated Sit to supine: Contact guard assist   General bed mobility comments: Assist to elevate trunk to upright, increased time to return to supine    Transfers Overall transfer level: Needs assistance Equipment used: Rolling walker (2 wheels) Transfers: Sit  to/from Stand Sit to Stand: Contact guard assist           General transfer comment: Slow to rise and achieve upright positioning, CGA for safety    Ambulation/Gait Ambulation/Gait assistance: Min assist Gait Distance (Feet): 150 Feet Assistive device: Rolling walker (2 wheels) Gait Pattern/deviations: Trunk flexed, Step-through pattern, Decreased stride length Gait velocity: 0.93 ft/s Gait velocity interpretation: <1.31 ft/sec, indicative of household ambulator   General Gait Details: Slow pace, increased cervical flexion, able to perform head turns to R/L and dual tasking. Up to minA for turns and with fatigue   Stairs             Wheelchair Mobility     Tilt Bed    Modified Rankin (Stroke Patients Only)       Balance Overall balance assessment: Needs assistance Sitting-balance support: No upper extremity supported, Feet supported Sitting balance-Leahy Scale: Good     Standing balance support: Bilateral upper extremity supported, During functional activity Standing balance-Leahy Scale: Fair                              Hotel manager: Impaired Factors Affecting Communication: Hearing impaired  Cognition Arousal: Alert Behavior During Therapy: WFL for tasks assessed/performed   PT - Cognitive impairments: No apparent impairments                         Following commands: Intact      Cueing    Exercises General Exercises - Lower Extremity Hip Flexion/Marching: Both, Seated, Other (comment) (for 1 minute)    General Comments        Pertinent Vitals/Pain  Pain Assessment Pain Assessment: Faces Faces Pain Scale: Hurts a little bit Pain Location: L hip Pain Descriptors / Indicators: Discomfort, Tender Pain Intervention(s): Monitored during session    Home Living                          Prior Function            PT Goals (current goals can now be found in the care plan  section) Acute Rehab PT Goals Patient Stated Goal: Return Home Potential to Achieve Goals: Good Progress towards PT goals: Progressing toward goals    Frequency    Min 2X/week      PT Plan      Co-evaluation              AM-PAC PT "6 Clicks" Mobility   Outcome Measure  Help needed turning from your back to your side while in a flat bed without using bedrails?: None Help needed moving from lying on your back to sitting on the side of a flat bed without using bedrails?: A Little Help needed moving to and from a bed to a chair (including a wheelchair)?: A Little Help needed standing up from a chair using your arms (e.g., wheelchair or bedside chair)?: A Little Help needed to walk in hospital room?: A Little Help needed climbing 3-5 steps with a railing? : A Lot 6 Click Score: 18    End of Session Equipment Utilized During Treatment: Gait belt Activity Tolerance: Patient tolerated treatment well Patient left: in bed;with call bell/phone within reach;with bed alarm set   PT Visit Diagnosis: Muscle weakness (generalized) (M62.81);Difficulty in walking, not elsewhere classified (R26.2);Other abnormalities of gait and mobility (R26.89);Unsteadiness on feet (R26.81)     Time: 1478-2956 PT Time Calculation (min) (ACUTE ONLY): 35 min  Charges:    $Therapeutic Activity: 23-37 mins PT General Charges $$ ACUTE PT VISIT: 1 Visit                     Verdia Glad, PT, DPT Acute Rehabilitation Services Office [phone]    Claria Crofts 08/13/2023, 11:01 AM

## 2023-08-13 NOTE — Plan of Care (Signed)
  Problem: Fluid Volume: Goal: Hemodynamic stability will improve Outcome: Progressing   Problem: Clinical Measurements: Goal: Diagnostic test results will improve Outcome: Progressing Goal: Signs and symptoms of infection will decrease Outcome: Progressing   Problem: Respiratory: Goal: Ability to maintain adequate ventilation will improve Outcome: Progressing   Problem: Coping: Goal: Ability to adjust to condition or change in health will improve Outcome: Progressing   Problem: Nutritional: Goal: Maintenance of adequate nutrition will improve Outcome: Progressing   Problem: Education: Goal: Knowledge of General Education information will improve Description: Including pain rating scale, medication(s)/side effects and non-pharmacologic comfort measures Outcome: Progressing   Problem: Health Behavior/Discharge Planning: Goal: Ability to manage health-related needs will improve Outcome: Progressing   Problem: Clinical Measurements: Goal: Ability to maintain clinical measurements within normal limits will improve Outcome: Progressing Goal: Diagnostic test results will improve Outcome: Progressing Goal: Respiratory complications will improve Outcome: Progressing Goal: Cardiovascular complication will be avoided Outcome: Progressing   Problem: Nutrition: Goal: Adequate nutrition will be maintained Outcome: Progressing   Problem: Coping: Goal: Level of anxiety will decrease Outcome: Progressing   Problem: Elimination: Goal: Will not experience complications related to bowel motility Outcome: Progressing Goal: Will not experience complications related to urinary retention Outcome: Progressing   Problem: Pain Managment: Goal: General experience of comfort will improve and/or be controlled Outcome: Progressing   Problem: Safety: Goal: Ability to remain free from injury will improve Outcome: Progressing   Problem: Clinical Measurements: Goal: Will remain free  from infection Outcome: Not Progressing   Problem: Activity: Goal: Risk for activity intolerance will decrease Outcome: Not Progressing

## 2023-08-14 DIAGNOSIS — E440 Moderate protein-calorie malnutrition: Secondary | ICD-10-CM

## 2023-08-14 DIAGNOSIS — K8010 Calculus of gallbladder with chronic cholecystitis without obstruction: Secondary | ICD-10-CM

## 2023-08-14 DIAGNOSIS — I10 Essential (primary) hypertension: Secondary | ICD-10-CM | POA: Diagnosis not present

## 2023-08-14 DIAGNOSIS — N401 Enlarged prostate with lower urinary tract symptoms: Secondary | ICD-10-CM | POA: Diagnosis not present

## 2023-08-14 DIAGNOSIS — R5381 Other malaise: Secondary | ICD-10-CM | POA: Diagnosis not present

## 2023-08-14 LAB — GLUCOSE, CAPILLARY
Glucose-Capillary: 180 mg/dL — ABNORMAL HIGH (ref 70–99)
Glucose-Capillary: 336 mg/dL — ABNORMAL HIGH (ref 70–99)
Glucose-Capillary: 217 mg/dL — ABNORMAL HIGH (ref 70–99)
Glucose-Capillary: 273 mg/dL — ABNORMAL HIGH (ref 70–99)

## 2023-08-14 MED ORDER — CHLORHEXIDINE GLUCONATE CLOTH 2 % EX PADS
6.0000 | MEDICATED_PAD | Freq: Every day | CUTANEOUS | Status: DC
  Administered 2023-08-14 – 2023-08-19 (×6): 6 via TOPICAL

## 2023-08-14 NOTE — Discharge Summary (Signed)
 Physician Discharge Summary  Patient ID: John [name] MRN: [id number] DOB/AGE: 05/17/1945 78 y.o.  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 Exam Location:  Inpatient Procedure: 2D Echo, Cardiac Doppler and Color Doppler (Both Spectral and Color            Flow Doppler were utilized during procedure). Indications:    122-I22.9 Subsequent ST elevation (STEM) and non-ST elevation                 (NSTEMI) myocardial infarction  History:        Patient has prior history of Echocardiogram examinations, most                 recent 05/10/2023.  Sonographer:    Andrena Bang Referring Phys: [id number] MUHAMMAD A ARIDA IMPRESSIONS  1. Left ventricular ejection fraction, by estimation, is 30 to 35%. The left ventricle has moderately decreased function. The left ventricle demonstrates regional wall motion abnormalities with mid to apical anteroseptal and inferoseptal akinesis and akinesis of the apical inferior/anterior/lateral walls. Akinesis of the true  apex. Findings suggest LAD territory infarction. No LV thrombus. There is mild concentric left ventricular hypertrophy. Left ventricular diastolic parameters are consistent with  Grade I diastolic dysfunction (impaired relaxation).  2. Right ventricular systolic function is normal. The right ventricular size is normal. There is mildly elevated pulmonary artery systolic pressure. The estimated right ventricular systolic pressure is 41.9 mmHg.  3. The aortic valve is tricuspid. There is mild calcification of the aortic valve. Aortic valve regurgitation is mild. No aortic stenosis is present.  4. The inferior vena cava is dilated in size with >50% respiratory variability, suggesting right atrial pressure of 8 mmHg.  5. The mitral valve is normal in structure. No evidence of mitral valve regurgitation. No evidence of mitral stenosis. FINDINGS  Left Ventricle: Left ventricular ejection fraction, by estimation, is 30 to 35%. The left ventricle has moderately decreased function. The left ventricle demonstrates regional wall motion abnormalities. Definity  contrast agent was given IV to delineate the left ventricular endocardial borders. The left ventricular internal cavity size was normal in size. There is mild concentric left ventricular hypertrophy. Left ventricular  diastolic parameters are consistent with Grade I diastolic dysfunction (impaired relaxation). Right Ventricle: The right ventricular size is normal. No increase in right ventricular wall thickness. Right ventricular systolic function is normal. There is mildly elevated pulmonary artery systolic pressure. The tricuspid regurgitant velocity is 2.91  m/s, and with an assumed right atrial pressure of 8 mmHg, the estimated right ventricular systolic pressure is 41.9 mmHg. Left Atrium: Left atrial size was normal in size. Right Atrium: Right atrial size was normal in size. Pericardium: There is no evidence of pericardial effusion. Mitral Valve: The mitral valve is  normal in structure. No evidence of mitral valve regurgitation. No evidence of mitral valve stenosis. Tricuspid Valve: The tricuspid valve is normal in structure. Tricuspid valve regurgitation is trivial. Aortic Valve: The aortic valve is tricuspid. There is mild calcification of the aortic valve. Aortic valve regurgitation is mild. No aortic stenosis is present. Aortic valve mean gradient measures 3.0 mmHg. Aortic valve peak gradient measures 6.5 mmHg. Aortic valve area, by VTI measures 2.34 cm. Pulmonic Valve: The pulmonic valve was normal in structure. Pulmonic valve regurgitation is trivial. Aorta: The aortic root is normal in size and structure. Venous: The inferior vena cava is dilated in size with greater than 50% respiratory variability, suggesting right atrial pressure of 8 mmHg. IAS/Shunts: No atrial level shunt detected by color flow Doppler. Additional Comments: A device lead is visualized in the right ventricle.  LEFT VENTRICLE PLAX 2D LVIDd:         5.20 cm      Diastology LVIDs:         3.70 cm      LV e' medial:    4.90 cm/s LV PW:         1.50 cm      LV E/e' medial:  15.3 LV IVS:        1.20 cm      LV e' lateral:   5.00 cm/s LVOT diam:     2.10 cm      LV E/e' lateral: 15.0 LV SV:         62 LV SV Index:   29 LVOT Area:     3.46 cm  LV Volumes (MOD) LV vol d, MOD A2C: 152.0 ml LV vol d, MOD A4C: 154.0 ml LV vol s, MOD A2C: 88.5 ml LV vol s, MOD A4C: 111.0 ml LV SV MOD A2C:     63.5 ml LV SV MOD A4C:     154.0 ml LV SV MOD BP:      55.3 ml RIGHT VENTRICLE RV S prime:     11.65 cm/s TAPSE (M-mode): 2.0 cm LEFT ATRIUM             Index LA diam:        4.10 cm 1.92 cm/m LA Vol (A2C):   45.8 ml 21.40 ml/m LA Vol (A4C):   45.4 ml 21.21 ml/m LA Biplane Vol: 48.4 ml 22.61 ml/m  AORTIC VALVE AV Area (Vmax):    2.24 cm AV Area (Vmean):   2.06 cm AV Area (VTI):     2.34 cm AV Vmax:           127.00 cm/s AV Vmean:          85.400 cm/s AV VTI:            0.264 m AV Peak Grad:      6.5 mmHg AV Mean  Grad:      3.0 mmHg LVOT Vmax:  82.20 cm/s LVOT Vmean:        50.800 cm/s LVOT VTI:          0.178 m LVOT/AV VTI ratio: 0.67  AORTA Ao Asc diam: 3.20 cm MITRAL VALVE                TRICUSPID VALVE MV Area (PHT): 3.81 cm     TR Peak grad:   33.9 mmHg MV Decel Time: 199 msec     TR Vmax:        291.00 cm/s MV E velocity: 74.90 cm/s MV A velocity: 108.00 cm/s  SHUNTS MV E/A ratio:  0.69         Systemic VTI:  0.18 m                             Systemic Diam: 2.10 cm Dalton McleanMD Electronically signed by Archer Bear Signature Date/Time: 08/11/2023/10:48:36 AM    Final    CARDIAC CATHETERIZATION Addendum Date: 08/10/2023   3rd Mrg lesion is 70% stenosed.   Prox RCA lesion is 95% stenosed.   1st LPL lesion is 30% stenosed.   LPDA lesion is 80% stenosed.   Mid LAD-1 lesion is 95% stenosed.   Mid LAD-2 lesion is 50% stenosed.   Dist LAD lesion is 40% stenosed.   Prox LAD to Mid LAD lesion is 40% stenosed.   Lat 1st Diag lesion is 80% stenosed.   A drug-eluting stent was successfully placed using a STENT SYNERGY XD 2.50X28.   Post intervention, there is a 0% residual stenosis.   Post intervention, there is a 0% residual stenosis.   LV end diastolic pressure is mildly elevated. 1.  Left dominant coronary arteries with significant three-vessel coronary artery disease.  The culprit is severe stenosis in the mid LAD.  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 2.  Left ventricular angiography was not diagnostic.  Will obtain an echocardiogram.  LVEDP was mildly to moderately elevated at 20 mmHg. 3.  Successful angioplasty and drug-eluting stent placement to the mid LAD. Recommendations: Will treat with aspirin  and clopidogrel  for now.  Heparin  can be resumed 2 hours after TR band removal given that he is on anticoagulation for pulmonary embolism.  Aspirin  can be stopped once the patient is started on a DOAC.  Clopidogrel  should be  continued for at least 6 months. The rest of small vessel disease should be managed medically. Unfortunately, his cholecystectomy will need to be postponed.  Result Date: 08/10/2023   3rd Mrg lesion is 70% stenosed.   Prox RCA lesion is 95% stenosed.   1st LPL lesion is 30% stenosed.   LPDA lesion is 80% stenosed.   Mid LAD-1 lesion is 95% stenosed.   Mid LAD-2 lesion is 50% stenosed.   Dist LAD lesion is 40% stenosed.   Prox LAD to Mid LAD lesion is 40% stenosed.   Lat 1st Diag lesion is 80% stenosed.   A drug-eluting stent was successfully placed using a STENT SYNERGY XD 2.50X28.   Post intervention, there is a 0% residual stenosis.   Post intervention, there is a 0% residual stenosis.   LV end diastolic pressure is mildly elevated. 1.  Left dominant coronary arteries with significant three-vessel coronary artery disease.  The culprit is severe stenosis in the mid LAD.  There is extensive small vessel coronary artery disease and significant stenosis in the nondominant  right coronary artery. 2.  Left ventricular angiography was not diagnostic.  Will obtain an echocardiogram.  LVEDP was mildly to moderately elevated at 20 mmHg. 3.  Successful angioplasty and drug-eluting stent placement to the mid LAD. Recommendations: Will treat with aspirin  and clopidogrel  for now.  Heparin  can be resumed 2 hours after TR band removal given that he is on anticoagulation for pulmonary embolism.  Aspirin  can be stopped once the patient is started on a DOAC.  Clopidogrel  should be continued for at least 6 months. The rest of small vessel disease should be managed medically.   CT HEAD WO CONTRAST ( ) Result Date: 08/10/2023 CLINICAL DATA:  Altered mental status, fell 1 week ago. EXAM: CT HEAD WITHOUT CONTRAST TECHNIQUE: Contiguous axial images were obtained from the base of the skull through the vertex without intravenous contrast. RADIATION DOSE REDUCTION: This exam was performed according to the departmental dose-optimization  program which includes automated exposure control, adjustment of the mA and/or kV according to patient size and/or use of iterative reconstruction technique. COMPARISON:  08/06/2023 FINDINGS: Brain: No change or acute finding. Chronic small-vessel ischemic changes affect pons. 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 Chronic small-vessel ischemic changes of the white matter. No sign of acute infarction, mass lesion, hemorrhage, hydrocephalus or extra-axial collection. Vascular: There is atherosclerotic calcification of the major vessels at the base of the brain. Skull: Negative Sinuses/Orbits: Clear/normal Other: None IMPRESSION: No acute finding by CT. Chronic small-vessel ischemic changes of the pons, thalami and cerebral hemispheric white matter. Electronically Signed   By: Bettylou Brunner M.D.   On: 08/10/2023 13:17   NM Hepato W/EF Result Date: 08/08/2023 CLINICAL DATA:  Vomiting, concern for cholecystitis. EXAM: NUCLEAR MEDICINE HEPATOBILIARY IMAGING WITH GALLBLADDER EF TECHNIQUE: Sequential images of the abdomen were obtained out to 60 minutes following intravenous administration of radiopharmaceutical. After oral ingestion of Ensure, gallbladder ejection fraction was determined. At 60 min, normal ejection fraction is greater than 33%. RADIOPHARMACEUTICALS:  5.5 mCi Tc-22m  Choletec  IV COMPARISON:  Ultrasound August 07, 2023 FINDINGS: Prompt uptake and biliary excretion of activity by the liver is seen. Gallbladder activity is visualized, consistent with patency of cystic duct. Biliary activity passes into small bowel, consistent with patent common bile duct. Calculated gallbladder ejection fraction is 4%. (Normal gallbladder ejection fraction with Ensure is greater than 33% and less than 80%.) IMPRESSION: Reduced gallbladder ejection fraction as can be seen with chronic cholecystitis/biliary dyskinesia. Electronically Signed   By: Tama Fails M.D.   On: 08/08/2023 15:18   US  Abdomen Limited RUQ  (LIVER/GB) Result Date: 08/07/2023 CLINICAL DATA:  Sepsis. EXAM: ULTRASOUND ABDOMEN LIMITED RIGHT UPPER QUADRANT COMPARISON:  CT AP from 08/06/2023 FINDINGS: Gallbladder: There are multiple stones with sludge within the gallbladder. These measure up to 1 cm. The gallbladder wall thickness measures 3.2 mm. Negative sonographic Murphy's sign. No pericholecystic fluid. Common bile duct: Diameter: 3.1 mm.  No intrahepatic bile duct dilatation. Liver: No focal lesion identified. Within normal limits in parenchymal echogenicity. Portal vein is patent on color Doppler imaging with normal direction of blood flow towards the liver. Other: None. IMPRESSION: Cholelithiasis and gallbladder sludge. Mild gallbladder wall thickening. Negative sonographic Murphy's sign. Findings are equivocal for cholecystitis. If there is a high clinical suspicion for acute cholecystitis consider further evaluation with nuclear medicine HIDA scan. Electronically Signed   By: Kimberley Penman M.D.   On: 08/07/2023 07:50   CT ABDOMEN PELVIS W CONTRAST Result Date: 08/06/2023 CLINICAL DATA:  Abdominal pain, sepsis EXAM: CT  ABDOMEN AND PELVIS WITH CONTRAST TECHNIQUE: Multidetector CT imaging of the abdomen and pelvis was performed using the standard protocol following bolus administration of intravenous contrast. RADIATION DOSE REDUCTION: This exam was performed according to the departmental dose-optimization program which includes automated exposure control, adjustment of the mA and/or kV according to patient size and/or use of iterative reconstruction technique. CONTRAST:  75mL OMNIPAQUE  IOHEXOL  350 MG/ML SOLN COMPARISON:  08/29/2020 FINDINGS: Lower chest: Bibasilar dependent hypoventilatory changes. No acute pleural or parenchymal lung disease. Hepatobiliary: Marked gallbladder distension, with multiple calcified gallstones layering dependently. No gallbladder wall thickening or pericholecystic fluid to suggest cholecystitis. Small amount of free  fluid along the dome of the liver. Liver parenchyma is unremarkable. No biliary duct dilation. Pancreas: Unremarkable. No pancreatic ductal dilatation or surrounding inflammatory changes. Spleen: Normal in size without focal abnormality. Adrenals/Urinary Tract: Kidneys enhance normally and symmetrically. No urinary tract calculi or obstructive uropathy within either kidney. Stable nodular thickening of the adrenal glands. Bladder is decompressed with a Foley catheter, limiting its evaluation. Stomach/Bowel: No bowel obstruction or ileus. Normal appendix right lower quadrant. No bowel wall thickening or inflammatory change. Vascular/Lymphatic: Aortic atherosclerosis. No enlarged abdominal or pelvic lymph nodes. Reproductive: Prostate is unremarkable. Other: Small amount of fluid along the dome of the liver. No free intraperitoneal gas. No abdominal wall hernia. Musculoskeletal: No acute or destructive bony abnormalities. Reconstructed images demonstrate no additional findings. IMPRESSION: 1. Distended gallbladder, with multiple calcified gallstones layering dependently. No CT evidence of acute cholecystitis. 2. Trace free fluid along the dome of the liver, nonspecific. 3.  Aortic Atherosclerosis (ICD10-I70.0). Electronically Signed   By: Bobbye Burrow M.D.   On: 08/06/2023 23:20   DG Chest Port 1 View Result Date: 08/06/2023 CLINICAL DATA:  Questionable sepsis.  Evaluate abnormality. EXAM: PORTABLE CHEST 1 VIEW COMPARISON:  Two-view chest x-ray 05/14/2023 FINDINGS: The heart size is normal. Pacing wires are stable. Mild pulmonary vascular congestion is present. Linear atelectasis is present in the right midlung. The lungs are otherwise clear. The visualized soft tissues and bony thorax are unremarkable. IMPRESSION: 1. Mild pulmonary vascular congestion. 2. Linear atelectasis in the right midlung. Electronically Signed   By: Audree Leas M.D.   On: 08/06/2023 18:50   CT Head Wo Contrast Result Date:  08/06/2023 CLINICAL DATA:  Head trauma. Ground level fall last night. Patient's wife was concerned for a stroke last night. Patient woke up more confused today with vomiting. EXAM: CT HEAD WITHOUT CONTRAST TECHNIQUE: Contiguous axial images were obtained from the base of the skull through the vertex without intravenous contrast. RADIATION DOSE REDUCTION: This exam was performed according to the departmental dose-optimization program which includes automated exposure control, adjustment of the mA and/or kV according to patient size and/or use of iterative reconstruction technique. COMPARISON:  CT head without contrast 05/08/2023. MR head without contrast/26/25. FINDINGS: Brain: Remote lacunar infarcts are again noted within the thalami bilaterally in the left caudate head. A remote lacunar infarct is Vascular: Acute dilation of the left lateral ventricle is noted. 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 No acute infarct, hemorrhage, or mass lesion is present. No significant extraaxial fluid collection is present. The brainstem and cerebellum are within normal limits. Midline structures are within normal limits. Skull: Calvarium is intact. No focal lytic or blastic lesions are present. No significant extracranial soft tissue lesion is present. Sinuses/Orbits: The paranasal sinuses and mastoid air cells are clear. Bilateral lens replacements are noted. Globes and orbits are otherwise unremarkable.  IMPRESSION: 1. No acute intracranial abnormality or significant interval change. 2. 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 3. 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 Electronically Signed   By: Audree Leas M.D.   On: 08/06/2023 18:49    Labs:  Basic Metabolic Panel: Recent Labs  Lab 08/11/23 0431 08/12/23 1019 08/13/23 0937 08/15/23 0540 08/17/23 1208  NA 139 139  139 138 136  K 3.1* 3.7 3.6 3.4* 3.9  CL 105 105 103 106 104  CO2 24 21* 27 27 22   GLUCOSE 160* 174* 207* 183* 324*  BUN 7* 5* 5* 6* 6*  CREATININE 0.74 0.74 0.88 0.81 0.93  CALCIUM  8.3* 8.9 9.2 8.7* 9.3  MG 2.0 1.8  --   --   --   PHOS 2.2*  --   --   --   --     CBC: Recent Labs  Lab 08/11/23 0431 08/12/23 1019 08/15/23 0540 08/17/23 1208  WBC 6.8 7.4 7.5 10.0  NEUTROABS 3.9 4.5  --   --   HGB 9.5* 10.8* 10.3* 11.1*  HCT 28.8* 33.7* 31.8* 35.0*  MCV 85.0 86.6 86.9 87.3  PLT 245 276 322 379    CBG: Recent Labs  Lab 08/16/23 0602 08/16/23 1123 08/16/23 1658 08/17/23 0612 08/17/23 1135  GLUCAP 191* 226* 259* 177* 313*   Family history.  Mother with lymphoma father with diabetes and CVA.  Denies any colon cancer esophageal cancer or rectal cancer  Brief HPI:   John [name] is a 77 y.o. right-handed male with history significant for BPH and bladder outlet obstruction with recent Foley placement 07/13/2023 who also has a history of prior CVA with residual left-sided weakness diabetes hypertension left femoral vein DVT diagnosed January 2025 on chronic Eliquis , heart block with pacemaker placed on May 12, 2023 who developed worsening weakness lethargy and falls at home.  Presented to [facility]/06/2023 due to these persistent problems.  Urinalysis was concerning for signs of infection and the patient ultimately found to have E. coli and Klebsiella UTI with sepsis.  Further imaging also concerning for Coley lithiasis as well as HIDA scan showing chronic cholecystitis and biliary akinesis.  Patient was placed on IV Ancef  for treatment of urinary tract infection.  Blood cultures remain negative.  Patient was evaluated by urology services 08/07/2023 and did not recommend a Foley exchange.  On 08/10/2023 patient experienced hypotensive episode with associated diaphoresis.  EKG demonstrated T wave changes and elevated troponin was found as well consistent with non-STEMI.   Patient was also taken for cardiac cath which demonstrated a left dominant coronary artery with significant three-vessel coronary artery disease.  The patient underwent angioplasty and drug-eluting stent placement to the LAD.  He was placed on Plavix  and aspirin .  He may discontinue aspirin  once Eliquis  resumed.  Patient also placed on Coreg  and spironolactone  per cardiology service.  As far as his cholelithiasis and cholecystitis a HIDA scan on 08/08/2023 demonstrated ejection fraction of 4% consistent with chronic cholecystitis and biliary dyskinesia.  Surgery had initially planned for cholecystectomy but given his MI the plan was now for elective cholecystectomy in about 6 months.  Recommendations 1 week for antibiotic per surgery.  Patient also dealing with chronic sacral wound which his wife has been managing at home.  The patient told me it was essentially resolved.  Given the patient's medical history and subsequent weakness as well as decreased functional mobility was admitted  for a comprehensive rehab program.   Hospital Course: John [name] was admitted to rehab 08/13/2023 for inpatient therapies to consist of PT, ST and OT at least three hours five days a week. Past admission physiatrist, therapy team and rehab RN have worked together to provide customized collaborative inpatient rehab.  Pertaining to patient's debility after urosepsis/non-STEMI remained stable.  Patient remained on chronic Eliquis  with the addition of Plavix .  Hospital course non-STEMI CAD three-vessel disease status post angioplasty stent LAD Plavix  therapy has directed.  No chest pain or shortness of breath.  Urosepsis/E. coli bacteremia completing course of Ancef  transitioning to Augmentin  for 8 days completing a 2-week course total of antibiotics.  BPH bladder outlet obstruction chronic Foley tube urology follow-up as outpatient.  Patient remained on Proscar  as well as Flomax  with hycosamine   for bladder spasms.  Chronic  cholecystitis cholecystectomy postponed due to MI elective cholecystectomy in 6 months follow-up outpatient general surgery.  Blood pressure remained soft with follow-up cardiology services patient no longer on Coreg  and Aldactone  plan for ProAmatine  5 mg 3 times daily as well as abdominal binder placed as well as support hose.  Chronic anemia no bleeding episodes continued on iron supplement and latest hemoglobin 11.1.   Blood pressures were monitored on TID basis and remained controlled and monitored     Rehab course: During patient's stay in rehab weekly team conferences were held to monitor patient's progress, set goals and discuss barriers to discharge. At admission, patient required minimal assist 150 feet rolling walker contact-guard sit to stand  Physical exam.  Blood pressure 129/72 pulse 74 temperature 98 respirations 18 oxygen saturation 95% room air Constitutional.  No acute distress HEENT Head.  Normocephalic and atraumatic Eyes.  Pupils round and reactive to light no discharge without nystagmus Neck.  Supple nontender no JVD without thyromegaly Cardiac regular rate and rhythm without any extra sounds or murmur heard Abdomen.  Soft nontender positive bowel sounds without rebound Respiratory effort normal no respiratory distress without wheeze Genitourinary.  Foley tube in place with clear urine Skin.  Comments.  Pacer left upper chest Neurologic.  Alert and oriented normal insight and awareness.  Intact memory.  Cranial nerve exam unremarkable.  Manual muscle testing bilateral upper extremities 4/5 deltoids 4+/5 bicep tricep wrist hands.  Bilateral lower extremities 4/5 HF KE and 4+/5 ADF/PF  He/She  has had improvement in activity tolerance, balance, postural control as well as ability to compensate for deficits. He/She has had improvement in functional use RUE/LUE  and RLE/LLE as well as improvement in awareness.  Sit to stand rolling walker supervision.  Ambulates contact-guard  to the sink.  Completes shaving his face and oral care with supervision.  Ambulates to supervision level using rolling walker from the gym to his room.  Navigate stairs supervision level.  Gather his belongings for activities day living homemaking.  Full family teaching completed and discharged to home       Disposition:  Discharge disposition: 06-Home-Health Care Svc        Diet: Regular  Special Instructions: No driving smoking or alcohol  Follow-up with urology services for chronic Foley tube  Follow-up general surgery in regards to consideration of cholecystectomy in approximately 6 months  Medications at discharge. 1.  Tylenol  as needed 2.  Eliquis  5 mg p.o. twice daily 3.  Lipitor  80 mg p.o. nightly 4.  Wellbutrin  75 mg twice daily 5.  Plavix  75 mg p.o. daily 6.  Vitamin B12 1000 mcg p.o. daily  7.  Ferrous sulfate  325 mg p.o. daily 8.  Proscar  5 mg p.o. daily 9.  Hyoscyamine  0.125 mg sublingual every 4 hours as needed bladder spasms 10.  Multivitamin few times a week 11.  Protonix  40 mg p.o. nightly 12.  Vitamin C 1000 mg 3 times a week 13.  Glucophage  500 mg daily 14.  Augmentin  875-125 mg 1 tablet every 12 hours x 3 days and stop 15.  Midodrine  5 mg p.o. 3 times daily   30-35 minutes were spent completing discharge summary and discharge planning     Follow-up Information     Raulkar, Keven Pel, MD Follow up.   Specialty: Physical Medicine and Rehabilitation Why: No formal follow-up needed Contact information: [address] [place] [address] [phone]         Roxane Copp, MD Follow up.   Specialty: Urology Why: Call for appointment Contact information: [address] [place] [address] [phone]         Lenise Quince, MD Follow up.   Specialty: Cardiology Why: Call for appointment Contact information: [address] [place] [address] [phone]         Adalberto Acton, MD Follow up.   Specialty: General Surgery Why: Call for appointment to discuss cholecystectomy Contact information: [address] [place] [address] [phone]                 Signed: Sterling Eisenmenger 08/17/2023, 12:53 PM

## 2023-08-14 NOTE — Progress Notes (Addendum)
 PROGRESS NOTE   Subjective/Complaints: Pt with fair night. Unhappy with food. Says he can't eat hospital food. Some of it has to do with diet he's on. Anxious to get home. Had leakage from foley last night.   ROS: Patient denies fever, rash, sore throat, blurred vision, dizziness, nausea, vomiting, diarrhea, cough, shortness of breath or chest pain, joint or back/neck pain, headache, or mood change.    Objective:   No results found. Recent Labs    08/12/23 1019  WBC 7.4  HGB 10.8*  HCT 33.7*  PLT 276   Recent Labs    08/12/23 1019 08/13/23 0937  NA 139 139  K 3.7 3.6  CL 105 103  CO2 21* 27  GLUCOSE 174* 207*  BUN 5* 5*  CREATININE 0.74 0.88  CALCIUM  8.9 9.2    Intake/Output Summary (Last 24 hours) at 08/14/2023 0955 Last data filed at 08/14/2023 4098 Gross per 24 hour  Intake 940 ml  Output --  Net 940 ml     Pressure Injury 08/06/23 Sacrum Medial Stage 1 -  Intact skin with non-blanchable redness of a localized area usually over a bony prominence. (Active)  08/06/23 2250  Location: Sacrum  Location Orientation: Medial  Staging: Stage 1 -  Intact skin with non-blanchable redness of a localized area usually over a bony prominence.  Wound Description (Comments):   Present on Admission: Yes    Physical Exam: Vital Signs Blood pressure (!) 117/59, pulse 70, temperature 98.2 F (36.8 C), resp. rate 17, height 6' (1.829 m), weight 89.8 kg, SpO2 96%.  General: Alert and oriented x 3, No apparent distress HEENT: Head is normocephalic, atraumatic, PERRLA, EOMI, sclera anicteric, oral mucosa pink and moist, dentition intact, ext ear canals clear,  Neck: Supple without JVD or lymphadenopathy Heart: Reg rate and rhythm. No murmurs rubs or gallops Chest: CTA bilaterally without wheezes, rales, or rhonchi; no distress Abdomen: Soft, non-tender, non-distended, bowel sounds positive. Extremities: No clubbing,  cyanosis, or edema. Pulses are 2+ Psych: Pt's affect is appropriate. Pt is cooperative Skin: some chronic changes in LE's. A few scattered abrasions and bruises. Pacer upper left chest wall. Uro: foley with clear, yellow urine Neuro:  Alert and oriented x 3. Normal insight and awareness. Intact Memory. Normal language and speech. Cranial nerve exam unremarkable. MMT: BUE 4/5 deltoids,4+ biceps, triceps, hands and wrists. BLE 4/5 HF, KE and 4+/5 ADF/PF. Sensory exam normal for light touch and pain in all 4 limbs. No limb ataxia or cerebellar signs. No abnormal tone appreciated.  .   Musculoskeletal: Full ROM, No pain with AROM or PROM in the neck, trunk, or extremities. Posture appropriate     Assessment/Plan: 1. Functional deficits which require 3+ hours per day of interdisciplinary therapy in a comprehensive inpatient rehab setting. Physiatrist is providing close team supervision and 24 hour management of active medical problems listed below. Physiatrist and rehab team continue to assess barriers to discharge/monitor patient progress toward functional and medical goals  Care Tool:  Bathing              Bathing assist       Upper Body Dressing/Undressing Upper body dressing  Upper body assist      Lower Body Dressing/Undressing Lower body dressing            Lower body assist       Toileting Toileting    Toileting assist       Transfers Chair/bed transfer  Transfers assist           Locomotion Ambulation   Ambulation assist              Walk 10 feet activity   Assist           Walk 50 feet activity   Assist           Walk 150 feet activity   Assist           Walk 10 feet on uneven surface  activity   Assist           Wheelchair     Assist               Wheelchair 50 feet with 2 turns activity    Assist            Wheelchair 150 feet activity     Assist          Blood  pressure (!) 117/59, pulse 70, temperature 98.2 F (36.8 C), resp. rate 17, height 6' (1.829 m), weight 89.8 kg, SpO2 96%.  Medical Problem List and Plan: 1. Functional deficits secondary to debility after urosepsis, NSTEMI             -pt is DNR-limited             -patient may  shower             -ELOS/Goals: 7-8 days, goals mod I to supervision  -Patient is beginning CIR therapies today including PT and OT  2.  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 Pain Management: fairly well controlled at present.              -tylenol  prn 4. Mood/Behavior/Sleep: team provide ego support             -trazodone  prn for sleep             -antipsychotic agents: n/a 5. Neuropsych/cognition: This patient is capable of making decisions on his own behalf. 6. 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 Fluids/Electrolytes/Nutrition: pt's intake has been inconsistent             -reviewd importance of po intake with pt             -will ask RD to follow along as well, protein supps             -5/4 will liberalize to regular diet to encourage better intake but will need to watch what he eats given gall bladder             -I personally reviewed the patient's labs today.    8. Urosepsis/ E Coli bacteremia             -  pt completed ancef  on acute/last night             -5/4-begin augmentin  today per ID and continue for 8 days for a total of 2 weeks of antibiotics 9. BPH with bladder outlet obstruction             -chronic foley, urology consulted on acute             -pt will see urology as outpt in follow up             -flush catheter as needed for obstruction or any  leakage--order is in             -flomax , proscar              -hyoscyamine  for bladder spasms 10. NSTEMI/CAD             -pt with 3 vessel disease, s/p angioplasty/stent LAD             -plavix , no asa as on eliquis  for DVT             -continue carvedilol , spironolactone              -monitor bps, weights and with increased activity during therapy 11. Cholelithiasis, biliary diskynesia, chronic cholecystitis             -Cholecystectomy postponed d/t MI             -elective chole in 6 mos             -abx coverage x 1 week recommended (augmentin ) 12. HTN             -bp's currently controlled             -only on carvedilol  13. Chronic anemia             -ferrous sulfate  supplement    LOS: 1 days A FACE TO FACE EVALUATION WAS PERFORMED  John [name] 08/14/2023, 9:55 AM

## 2023-08-14 NOTE — Discharge Instructions (Addendum)
 Inpatient Rehab Discharge Instructions  John [name] Discharge date and time: 08/19/23   Activities/Precautions/ Functional Status: Activity: No driving or strenuous activity till cleared by MD. Abdominal binder when out of bed and support hose Diet: Low salt. Low fat/heart healthy Wound Care: Routine skin checks   Functional status:  ___ No restrictions     ___ Walk up steps independently _X__ 24/7 supervision/assistance   ___ Walk up steps with assistance ___ Intermittent supervision/assistance  ___ Bathe/dress independently ___ Walk with walker     _x__ Bathe/dress with assistance ___ Walk Independently    ___ Shower independently _X__ Walk with supervision.     ___ Shower with assistance _X__ No alcohol     ___ Return to work/school ________  Special Instructions: No driving, smoking or alcohol 2. Wear abdominal binder and TEDs when out of bed to support  your blood pressure.  3. Foley care twice a day and as needed if soiled/after BM.    COMMUNITY REFERRALS UPON DISCHARGE:    Home Health:   PT     OT     RN                Agency:[facility]    Phone:[phone]    Medical Equipment/Items Ordered:HAS ALL DME FROM PAST ADMISSION                                                 Agency/Supplier:NA    My questions have been answered and I understand these instructions. I will adhere to these goals and the provided educational materials after my discharge from the hospital.  Patient/Caregiver Signature _______________________________ Date __________  Clinician Signature _______________________________________ Date __________  Please bring this form and your medication list with you to all your follow-up doctor's appointments.

## 2023-08-14 NOTE — Progress Notes (Signed)
 Inpatient Rehabilitation Admission Medication Review by a Pharmacist  A complete drug regimen review was completed for this patient to identify any potential clinically significant medication issues.  High Risk Drug Classes Is patient taking? Indication by Medication  Antipsychotic No   Anticoagulant Yes Apixaban  - Atrial fibrillation  Antibiotic Yes Augmentin  - urinary tract infection  Opioid No   Antiplatelet Yes Plavix  - CAD/Mi  Hypoglycemics/insulin  Yes Insulin  aspart - DM  Vasoactive Medication Yes Carvedilol , spironolactone  - Blood pressure/Hx MI  Chemotherapy No   Other Yes Atorvastatin  - CVA prevention Flomax , Proscar  - BPH Protonix  - GERD Sennakot-S - Constipation Trazodone  - PRN sleep     Type of Medication Issue Identified Description of Issue Recommendation(s)  Drug Interaction(s) (clinically significant)     Duplicate Therapy     Allergy     No Medication Administration End Date     Incorrect Dose     Additional Drug Therapy Needed     Significant med changes from prior encounter (inform family/care partners about these prior to discharge). Jardiance , metformin  held at inpatient discharge. Bupropion  continued at inpatient discharge, but not resumed at [facility] admission. Communicate relevant medication changes to patient/family members at discharge from CIR.   Restart bupropion .  Other       Clinically significant medication issues were identified that warrant physician communication and completion of prescribed/recommended actions by midnight of the next day:  No  Name of provider notified for urgent issues identified: N/a  Provider Method of Notification: N/a   Pharmacist comments: Recommend restart bupropion .   Time spent performing this drug regimen review (minutes):  30  Estela Held, PharmD PGY-2 Infectious Diseases Pharmacy Resident [facility] 08/14/2023 7:45 AM

## 2023-08-15 ENCOUNTER — Other Ambulatory Visit: Payer: Self-pay

## 2023-08-15 DIAGNOSIS — R5381 Other malaise: Principal | ICD-10-CM

## 2023-08-15 DIAGNOSIS — R41844 Frontal lobe and executive function deficit: Secondary | ICD-10-CM

## 2023-08-15 DIAGNOSIS — N320 Bladder-neck obstruction: Secondary | ICD-10-CM | POA: Insufficient documentation

## 2023-08-15 DIAGNOSIS — A4151 Sepsis due to Escherichia coli [E. coli]: Secondary | ICD-10-CM | POA: Insufficient documentation

## 2023-08-15 DIAGNOSIS — I82519 Chronic embolism and thrombosis of unspecified femoral vein: Secondary | ICD-10-CM | POA: Insufficient documentation

## 2023-08-15 LAB — CULTURE, BLOOD (ROUTINE X 2)
Culture: NO GROWTH
Culture: NO GROWTH

## 2023-08-15 LAB — COMPREHENSIVE METABOLIC PANEL WITH GFR
ALT: 10 U/L (ref 0–44)
AST: 17 U/L (ref 15–41)
Albumin: 2.5 g/dL — ABNORMAL LOW (ref 3.5–5.0)
Alkaline Phosphatase: 51 U/L (ref 38–126)
Anion gap: 5 (ref 5–15)
BUN: 6 mg/dL — ABNORMAL LOW (ref 8–23)
CO2: 27 mmol/L (ref 22–32)
Calcium: 8.7 mg/dL — ABNORMAL LOW (ref 8.9–10.3)
Chloride: 106 mmol/L (ref 98–111)
Creatinine, Ser: 0.81 mg/dL (ref 0.61–1.24)
GFR, Estimated: >60 mL/min (ref >60)
Glucose, Bld: 183 mg/dL — ABNORMAL HIGH (ref 70–99)
Potassium: 3.4 mmol/L — ABNORMAL LOW (ref 3.5–5.1)
Sodium: 138 mmol/L (ref 135–145)
Total Bilirubin: 2.1 mg/dL — ABNORMAL HIGH (ref 0.0–1.2)
Total Protein: 5.1 g/dL — ABNORMAL LOW (ref 6.5–8.1)

## 2023-08-15 LAB — CBC
HCT: 31.8 % — ABNORMAL LOW (ref 39.0–52.0)
Hemoglobin: 10.3 g/dL — ABNORMAL LOW (ref 13.0–17.0)
MCH: 28.1 pg (ref 26.0–34.0)
MCHC: 32.4 g/dL (ref 30.0–36.0)
MCV: 86.9 fL (ref 80.0–100.0)
Platelets: 322 10*3/uL (ref 150–400)
RBC: 3.66 MIL/uL — ABNORMAL LOW (ref 4.22–5.81)
RDW: 18.6 % — ABNORMAL HIGH (ref 11.5–15.5)
WBC: 7.5 10*3/uL (ref 4.0–10.5)
nRBC: 0.3 % — ABNORMAL HIGH (ref 0.0–0.2)

## 2023-08-15 LAB — GLUCOSE, CAPILLARY
Glucose-Capillary: 156 mg/dL — ABNORMAL HIGH (ref 70–99)
Glucose-Capillary: 387 mg/dL — ABNORMAL HIGH (ref 70–99)
Glucose-Capillary: 274 mg/dL — ABNORMAL HIGH (ref 70–99)
Glucose-Capillary: 254 mg/dL — ABNORMAL HIGH (ref 70–99)

## 2023-08-15 MED ORDER — CARVEDILOL 3.125 MG PO TABS: 3.1250 mg | ORAL_TABLET | Freq: Every day | ORAL | Status: DC

## 2023-08-15 MED ORDER — POTASSIUM CHLORIDE 20 MEQ PO PACK: 40.0000 meq | PACK | Freq: Once | ORAL | Status: DC

## 2023-08-15 MED ORDER — TAMSULOSIN HCL 0.4 MG PO CAPS
0.4000 mg | ORAL_CAPSULE | Freq: Every day | ORAL | Status: DC
  Administered 2023-08-15 – 2023-08-17 (×3): 0.4 mg via ORAL
  Filled 2023-08-15 (×3): qty 1

## 2023-08-15 NOTE — Progress Notes (Signed)
 Inpatient Rehabilitation Center Individual Statement of Services  Patient Name:  John [name]  Date:  08/15/2023  Welcome to the Inpatient Rehabilitation Center.  Our goal is to provide you with an individualized program based on your diagnosis and situation, designed to meet your specific needs.  With this comprehensive rehabilitation program, you will be expected to participate in at least 3 hours of rehabilitation therapies Monday-Friday, with modified therapy programming on the weekends.  Your rehabilitation program will include the following services:  Physical Therapy (PT), Occupational Therapy (OT), 24 hour per day rehabilitation nursing, Care Coordinator, Rehabilitation Medicine, Nutrition Services, and Pharmacy Services  Weekly team conferences will be held on Wednesday to discuss your progress.  Your Inpatient Rehabilitation Care Coordinator will talk with you frequently to get your input and to update you on team discussions.  Team conferences with you and your family in attendance may also be held.  Expected length of stay: 6-9 days  Overall anticipated outcome: supervision-mod/I level  Depending on your progress and recovery, your program may change. Your Inpatient Rehabilitation Care Coordinator will coordinate services and will keep you informed of any changes. Your Inpatient Rehabilitation Care Coordinator's name and contact numbers are listed  below.  The following services may also be recommended but are not provided by the Inpatient Rehabilitation Center:   Home Health Rehabiltiation Services Outpatient Rehabilitation Services    Arrangements will be made to provide these services after discharge if needed.  Arrangements include referral to agencies that provide these services.  Your insurance has been verified to be:  [employer] Your primary doctor is:    Pertinent information will be shared with your doctor and your insurance company.  Inpatient Rehabilitation  Care Coordinator:  Adrianna Albee, [place] [phone] or ([phone]  Information discussed with and copy given to patient by: Mardell Shade, 08/15/2023, 10:10 AM

## 2023-08-15 NOTE — Progress Notes (Signed)
 Patient ID: John [name], male   DOB: [birth year], 78 y.o.   MRN: [id number] Met with the patient to review current medical situation, rehab process, team conference and plan of care. Discussed management of secondary risk including HF, HTN, HLD.  PA confirmed urology follow up foley. Reported slept well and feeling well enough to go home but understands recommendation for 12-14 days to get stronger.  Continue to follow along to address educational needs to facilitate preparation for discharge. Naoma Bacca

## 2023-08-15 NOTE — Evaluation (Signed)
 Occupational Therapy Assessment and Plan  Patient Details  Name: John [name] MRN: [id number] Date of Birth: 1945-07-05  OT Diagnosis: muscle weakness (generalized), decreased activity tolerance  Rehab Potential: Rehab Potential (ACUTE ONLY): Good ELOS: 7-9 days   Today's Date: 08/15/2023 OT Individual Time: 6578-4696 OT Individual Time Calculation (min): 57 min     Hospital Problem: Principal Problem:   Debility Active Problems:   BPH (benign prostatic hyperplasia)   Foley catheter in place   Cholelithiasis   Sacral pressure ulcer   DVT, femoral, chronic (HCC)   Bladder outlet obstruction   Sepsis due to Escherichia coli [facility])   Past Medical History:  Past Medical History:  Diagnosis Date   BPH (benign prostatic hyperplasia)    CTS (carpal tunnel syndrome)    Depression    Diabetes mellitus    Essential hypertension    GERD (gastroesophageal reflux disease)    History of breast cancer    Hypercholesteremia    Neuropathy    Past Surgical History:  Past Surgical History:  Procedure Laterality Date   CATARACT EXTRACTION, BILATERAL  01/18/2017   CORONARY STENT INTERVENTION N/A 08/10/2023   Procedure: CORONARY STENT INTERVENTION;  Surgeon: John Hamilton, MD;  Location: [facility] INVASIVE CV LAB;  Service: Cardiovascular;  Laterality: N/A;   LEFT HEART CATH AND CORONARY ANGIOGRAPHY N/A 08/10/2023   Procedure: LEFT HEART CATH AND CORONARY ANGIOGRAPHY;  Surgeon: John Hamilton, MD;  Location: [facility] INVASIVE CV LAB;  Service: Cardiovascular;  Laterality: N/A;   LITHOTRIPSY     MASTECTOMY Right 05/02/2018   PACEMAKER IMPLANT N/A 05/13/2023   Procedure: PACEMAKER IMPLANT;  Surgeon: John Kohler, MD;  Location: [facility] INVASIVE CV LAB;  Service: Cardiovascular;  Laterality: N/A;   TEMPORARY PACEMAKER N/A 05/11/2023   Procedure: TEMPORARY PACEMAKER;  Surgeon: [name], John M, MD;  Location: [facility] INVASIVE CV LAB;  Service: Cardiovascular;  Laterality: N/A;   TONSILLECTOMY AND  ADENOIDECTOMY      Assessment & Plan Clinical Impression: John [name] is a 78 y.o. male with PMH significant for T2DM, indwelling Foley catheter, GERD, BPH, HTN, HLD, s/p pacemaker, breast cancer, DVT/PE, and CVA. admitted  to [facility] 08/06/23 for weakness, lethargy and recurrent falls. Pt presented with AMS after a ground level fall at home. Recent Foley cath placed by urology on 07/13/23 in the setting of BPH and outflow obstruction. Found to have E. Coli bacteremia, sepsis secondary to UTI and treated with antibiotics. Imaging concerned for cholelithiasis, HIDA scan concerning for chronic cholecystitis or biliary akinesis. On 08/10/23 has hypotensive episode, diaphoresis. EKG showed T wave changes, elevated troponins, and cardiology consulted. Underwent cardiac cath 08/10/23 had found to have an MI, and had stent placement. Choly to be postponed as OP in 6 months. Started on Plavix  and ASA. SSI for CBGS monitoring. Began B12 replacement. Bps are currently soft.  Patient transferred to CIR on 08/13/2023 .    Patient currently requires min with basic self-care skills secondary to muscle weakness, decreased cardiorespiratoy endurance, decreased problem solving, decreased safety awareness, and decreased memory, central origin, and decreased sitting balance, decreased standing balance, decreased postural control, and decreased balance strategies.  Prior to hospitalization, patient could complete BADLs with modified independent  using RW to intermittent supervision.  Patient will benefit from skilled intervention to decrease level of assist with basic self-care skills and increase independence with basic self-care skills prior to discharge home with care partner.  Anticipate patient will require intermittent supervision and follow up home health.  OT -  End of Session Activity Tolerance: Decreased this session Endurance Deficit: Yes OT Assessment Rehab Potential (ACUTE ONLY): Good OT Patient demonstrates  impairments in the following area(s): Balance;Endurance;Motor;Pain;Safety;Cognition;Behavior OT Basic ADL's Functional Problem(s): Grooming;Bathing;Dressing;Toileting OT Transfers Functional Problem(s): Tub/Shower;Toilet OT Plan OT Intensity: Minimum of 1-2 x/day, 45 to 90 minutes OT Frequency: 5 out of 7 days OT Duration/Estimated Length of Stay: 7-9 days OT Treatment/Interventions: Balance/vestibular training;Discharge planning;Pain management;Self Care/advanced ADL retraining;Therapeutic Activities;UE/LE Coordination activities;Cognitive remediation/compensation;Disease mangement/prevention;Functional mobility training;Patient/family education;Skin care/wound managment;Therapeutic Exercise;Visual/perceptual remediation/compensation;Community reintegration;DME/adaptive equipment instruction;Neuromuscular re-education;Psychosocial support;Splinting/orthotics;UE/LE Strength taining/ROM;Wheelchair propulsion/positioning OT Self Feeding Anticipated Outcome(s): Independent OT Basic Self-Care Anticipated Outcome(s): Supervision OT Toileting Anticipated Outcome(s): Supervision OT Bathroom Transfers Anticipated Outcome(s): Supervision OT Recommendation Recommendations for Other Services: Therapeutic Recreation consult Therapeutic Recreation Interventions: Pet therapy;Stress management Patient destination: Home Follow Up Recommendations: Home health OT Equipment Recommended: To be determined   OT Evaluation Precautions/Restrictions  Precautions Precautions: Fall Recall of Precautions/Restrictions: Intact Precaution/Restrictions Comments: watch BP Restrictions Weight Bearing Restrictions Per Provider Order: No General Chart Reviewed: Yes Additional Pertinent History: T2DM, indwelling Foley catheter, GERD, BPH, HTN, HLD, s/p pacemaker, breast cancer, DVT/PE, and CVA Family/Caregiver Present: No Vital Signs   Pain Pain Assessment Pain Scale: 0-10 Pain Score: 0-No pain Home Living/Prior  Functioning Home Living Family/patient expects to be discharged to:: Private residence Living Arrangements: Spouse/significant other Available Help at Discharge: Family, Available 24 hours/day Type of Home: House Home Access: Stairs to enter [employer] of Steps: 3 Entrance Stairs-Rails: Right, Left, Can reach both Home Layout: Two level, Able to live on main level with bedroom/bathroom Alternate Level Stairs-Number of Steps: 14 Alternate Level Stairs-Rails: Right, Left, Can reach both Bathroom Shower/Tub: Walk-in shower, Curtain Firefighter: Handicapped height Bathroom Accessibility: No  Lives With: Spouse IADL History Homemaking Responsibilities: No Current License: Yes (however does not drive) Mode of Transportation: Family Occupation: Retired Prior Function Level of Independence: Independent with gait, Requires assistive device for independence, Independent with basic ADLs  Able to Take Stairs?: Yes (only takes the stairs into the home) Driving: No (wife drives) Vocation: Retired Administrator, sports Baseline Vision/History: 1 Wears glasses Ability to See in Adequate Light: 0 Adequate Patient Visual Report: No change from baseline Vision Assessment?: No apparent visual deficits Perception  Perception: Within Functional Limits Praxis Praxis: WFL Cognition Cognition Overall Cognitive Status: History of cognitive impairments - at baseline (Pt presents with baseline memory, awareness, attention, and problem solving deficits at baseline) Arousal/Alertness: Awake/alert Orientation Level: Person;Place;Situation Person: Oriented Place: Oriented Situation: Oriented Memory: Impaired (baseline impairement) Memory Impairment: Retrieval deficit;Storage deficit Attention: Selective Selective Attention: Appears intact Awareness: Impaired Awareness Impairment: Emergent impairment Problem Solving: Impaired Behaviors: Other (comment) (self-limiting) Safety/Judgment:  Impaired Brief Interview for Mental Status (BIMS) Repetition of Three Words (First Attempt): 3 Temporal Orientation: Year: Correct Temporal Orientation: Month: Accurate within 5 days Temporal Orientation: Day: Correct Recall: "Sock": Yes, after cueing ("something to wear") Recall: "Blue": Yes, no cue required Recall: "Bed": Yes, no cue required BIMS Summary Score: 14 Sensation Sensation Light Touch: Appears Intact Hot/Cold: Appears Intact Proprioception: Appears Intact Additional Comments: 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 Coordination Gross Motor Movements are Fluid and Coordinated: No Fine Motor Movements are Fluid and Coordinated: No Coordination and Movement Description: mild L UE coordination deficits d/t previous CVA Finger Nose Finger Test: slowed movements bilaterally Motor  Motor Motor: Other (comment) Motor - Skilled Clinical Observations: weakness 2/2 generalized deconditioning  Trunk/Postural Assessment  Cervical Assessment Cervical Assessment: Exceptions to [facility] (forward head) Thoracic Assessment Thoracic Assessment: Exceptions to [facility] (rounded  shoudlers) Lumbar Assessment Lumbar Assessment: Exceptions to [facility] (posterior pelvic tilt) Postural Control Postural Control: Deficits on evaluation Trunk Control: posterior bias  Balance Balance Balance Assessed: Yes Static Sitting Balance Static Sitting - Balance Support: Feet supported Static Sitting - Level of Assistance: 6: Modified independent (Device/Increase time) Dynamic Sitting Balance Dynamic Sitting - Balance Support: Feet supported Dynamic Sitting - Level of Assistance: 5: Stand by assistance Static Standing Balance Static Standing - Balance Support: During functional activity;Left upper extremity supported Static Standing - Level of Assistance: 5: Stand by assistance (CGA) Dynamic Standing Balance Dynamic Standing - Balance Support: Left upper extremity supported;During functional activity;Bilateral upper  extremity supported Dynamic Standing - Level of Assistance: 4: Min assist;5: Stand by assistance (CGA-min A) Extremity/Trunk Assessment RUE Assessment RUE Assessment: Exceptions to [facility] General Strength Comments: Grip 52 lbs, lateral pinch 14.5; 4/5 overall d/t general deconditioning LUE Assessment LUE Assessment: Exceptions to [facility] Passive Range of Motion (PROM) Comments: WFL Active Range of Motion (AROM) Comments: WFL General Strength Comments: Grip 44 lbs, lateral pinch 11.3 lbs; 4/5 overall d/t residual L hemiparesis and general deconditioning  Care Tool Care Tool Self Care Eating   Eating Assist Level: Set up assist    Oral Care    Oral Care Assist Level: Set up assist    Bathing   Body parts bathed by patient: Right arm;Left arm;Chest;Abdomen;Front perineal area;Right upper leg;Left upper leg;Right lower leg;Left lower leg;Face Body parts bathed by helper: Buttocks   Assist Level: Minimal Assistance - Patient > 75%    Upper Body Dressing(including orthotics)   What is the patient wearing?: Pull over shirt   Assist Level: Set up assist    Lower Body Dressing (excluding footwear)   What is the patient wearing?: Underwear/pull up;Incontinence brief Assist for lower body dressing: Minimal Assistance - Patient > 75%    Putting on/Taking off footwear     Assist for footwear: Moderate Assistance - Patient 50 - 74%       Care Tool Toileting Toileting activity   Assist for toileting: Minimal Assistance - Patient > 75%     Care Tool Bed Mobility Roll left and right activity   Roll left and right assist level: Contact Guard/Touching assist    Sit to lying activity   Sit to lying assist level: Contact Guard/Touching assist    Lying to sitting on side of bed activity   Lying to sitting on side of bed assist level: the ability to move from lying on the back to sitting on the side of the bed with no back support.: Contact Guard/Touching assist     Care Tool Transfers Sit  to stand transfer   Sit to stand assist level: Minimal Assistance - Patient > 75%    Chair/bed transfer   Chair/bed transfer assist level: Minimal Assistance - Patient > 75%     Toilet transfer   Assist Level: Minimal Assistance - Patient > 75%     Care Tool Cognition  Expression of Ideas and Wants Expression of Ideas and Wants: 4. Without difficulty (complex and basic) - expresses complex messages without difficulty and with speech that is clear and easy to understand  Understanding Verbal and Non-Verbal Content Understanding Verbal and Non-Verbal Content: 4. Understands (complex and basic) - clear comprehension without cues or repetitions   Memory/Recall Ability Memory/Recall Ability : Staff names and faces;That he or she is in a hospital/hospital unit;Location of own room;Current season   Refer to Care Plan for Long Term Goals  SHORT TERM  GOAL WEEK 1 OT Short Term Goal 1 (Week 1): STG=LTG d/t ELOS  Recommendations for other services: Therapeutic Recreation  Pet therapy and Stress management   Skilled Therapeutic Intervention Skilled Therapeutic Interventions/Progress Updates: 1:1 OT evaluation and intervention initiated with skilled education provided on OT role, goal, and POC. Pt received semi-reclined in bed presenting fatigued, however to be in good spirits receptive to skilled OT session reporting 0/10 pain- OT offering intermittent rest breaks, repositioning, and therapeutic support to optimize participation in therapy session. Pt completed BADLs at levels listed below using RW for functional transfers. Pt presenting with significant balance, endurance, and weakness deficits as well as dizziness limiting his independence in BADLs. Pt was left resting in recliner with call bell in reach, chair alarm on, and all needs met.   ADL ADL Eating: Set up;Independent Where Assessed-Eating: Chair Grooming: Minimal assistance Where Assessed-Grooming: Sitting at sink Upper Body Bathing:  Setup Where Assessed-Upper Body Bathing: Edge of bed Lower Body Bathing: Minimal assistance Where Assessed-Lower Body Bathing: Edge of bed Upper Body Dressing: Setup Where Assessed-Upper Body Dressing: Edge of bed Lower Body Dressing: Minimal assistance Where Assessed-Lower Body Dressing: Edge of bed Toileting: Minimal assistance Where Assessed-Toileting: Teacher, adult education: Licensed conveyancer: Engineer, technical sales: Not assessed Film/video editor: Not assessed Mobility  Bed Mobility Bed Mobility: Rolling Right;Rolling Left;Sit to Supine;Supine to Sit Rolling Right: Independent with assistive device Rolling Left: Independent with assistive device Supine to Sit: Contact Guard/Touching assist Sit to Supine: Contact Guard/Touching assist Transfers Sit to Stand: Contact Guard/Touching assist Stand to Sit: Contact Guard/Touching assist   Discharge Criteria: Patient will be discharged from OT if patient refuses treatment 3 consecutive times without medical reason, if treatment goals not met, if there is a change in medical status, if patient makes no progress towards goals or if patient is discharged from hospital.  The above assessment, treatment plan, treatment alternatives and goals were discussed and mutually agreed upon: by patient  Geoffery Kiel 08/15/2023, 12:26 PM

## 2023-08-15 NOTE — Progress Notes (Signed)
 Inpatient Rehabilitation  Patient information reviewed and entered into eRehab system by [facility]. Karen Kays., CCC/SLP, PPS Coordinator.  Information including medical coding, functional ability and quality indicators will be reviewed and updated through discharge.

## 2023-08-15 NOTE — Plan of Care (Signed)
  Problem: RH Balance Goal: LTG Patient will maintain dynamic standing balance (PT) Description: LTG:  Patient will maintain dynamic standing balance with assistance during mobility activities (PT) Flowsheets (Taken 08/15/2023 1351) LTG: Pt will maintain dynamic standing balance during mobility activities with:: Independent with assistive device    Problem: Sit to Stand Goal: LTG:  Patient will perform sit to stand with assistance level (PT) Description: LTG:  Patient will perform sit to stand with assistance level (PT) Flowsheets (Taken 08/15/2023 1351) LTG: PT will perform sit to stand in preparation for functional mobility with assistance level: Independent with assistive device   Problem: RH Bed Mobility Goal: LTG Patient will perform bed mobility with assist (PT) Description: LTG: Patient will perform bed mobility with assistance, with/without cues (PT). Flowsheets (Taken 08/15/2023 1351) LTG: Pt will perform bed mobility with assistance level of: Independent with assistive device    Problem: RH Bed to Chair Transfers Goal: LTG Patient will perform bed/chair transfers w/assist (PT) Description: LTG: Patient will perform bed to chair transfers with assistance (PT). Flowsheets (Taken 08/15/2023 1351) LTG: Pt will perform Bed to Chair Transfers with assistance level: Independent with assistive device    Problem: RH Car Transfers Goal: LTG Patient will perform car transfers with assist (PT) Description: LTG: Patient will perform car transfers with assistance (PT). Flowsheets (Taken 08/15/2023 1351) LTG: Pt will perform car transfers with assist:: Supervision/Verbal cueing   Problem: RH Furniture Transfers Goal: LTG Patient will perform furniture transfers w/assist (OT/PT) Description: LTG: Patient will perform furniture transfers  with assistance (OT/PT). Flowsheets (Taken 08/15/2023 1351) LTG: Pt will perform furniture transfers with assist:: Independent with assistive device    Problem: RH  Ambulation Goal: LTG Patient will ambulate in home environment (PT) Description: LTG: Patient will ambulate in home environment, # of feet with assistance (PT). Flowsheets (Taken 08/15/2023 1351) LTG: Pt will ambulate in home environ  assist needed:: Independent with assistive device LTG: Ambulation distance in home environment: at least 50 ft per bout using LRAD navigating all obstacles and turns Goal: LTG Patient will ambulate in community environment (PT) Description: LTG: Patient will ambulate in community environment, # of feet with assistance (PT). Flowsheets (Taken 08/15/2023 1351) LTG: Pt will ambulate in community environ  assist needed:: Supervision/Verbal cueing LTG: Ambulation distance in community environment: at least 300 ft using LRAD   Problem: RH Stairs Goal: LTG Patient will ambulate up and down stairs w/assist (PT) Description: LTG: Patient will ambulate up and down # of stairs with assistance (PT) Flowsheets (Taken 08/15/2023 1351) LTG: Pt will ambulate up/down stairs assist needed:: Supervision/Verbal cueing LTG: Pt will  ambulate up and down number of stairs: at least 4 steps using HR setup as per home environment

## 2023-08-15 NOTE — Progress Notes (Signed)
 Physical Therapy Session Note  Patient Details  Name: John [name] MRN: [id number] Date of Birth: 21-Jun-1945  Today's Date: 08/15/2023 PT Individual Time: 1303-1400 PT Individual Time Calculation (min): 57 min  And Today's Date: 08/15/2023 PT Missed Time: 18 min  Missed Time Reason: Patient fatigue; Patient unwilling to participate   Short Term Goals: Week 1:  PT Short Term Goal 1 (Week 1): STG = LTG d/t ELOS  Skilled Therapeutic Interventions/Progress Updates:  Patient seated upright in recliner on entrance to room. Patient alert and agreeable to PT session.   Patient with no pain complaint at start of session. Does complain about how long he has been sitting in recliner. (1.5 hrs)  Therapeutic Activity: Transfers: Pt performed sit<>stand and stand pivot transfers throughout session with CGA/ MinA continuing to use Bil knees to push into seated surface for assist in power up. Provided vc/ tc for upright posture.  Car transfer performed with CGA and pt self choosing to sit then pivot into car. Then back out to RW, again using Bil knees to push into side of car.   Gait Training:  Pt ambulated 183' x1 using RW with heavy BUE pressure into handles. Provided vc/ tc for more upright posture but pt unable to correct d/t fatigue.  Stair training initiated and pt attempts reciprocal gait pattern to ascend but LLE does not have strength to ascend step on 2 attempts so pt performs with step-to pattern leading with RLE. Guided into leading with LLE to descend. Pt completes with MinA improving to CGA throughout.   Pt requests to end session with complaint of fatigue.   Patient supine at end of session with brakes locked, bed alarm set, and all needs within reach.  Pt missed 18 min of skilled therapy due to fatigue. Will re-attempt as schedule and pt availability permits.   Therapy Documentation Precautions:  Precautions Precautions: Fall Recall of Precautions/Restrictions:  Intact Precaution/Restrictions Comments: angioplasty and stent placement to the mid LAD, stent in LLE, foley cath, pacemaker, low activity tolerance, watch BP, hx of breast CA Restrictions Weight Bearing Restrictions Per Provider Order: No  Pain: Pain Assessment Pain Scale: 0-10 Pain Score: 0-No pain related this session.     Therapy/Group: Individual Therapy  Donne Gage PT, DPT, CSRS 08/15/2023, 1:50 PM

## 2023-08-15 NOTE — Progress Notes (Signed)
 Patient had leakage from his foley over the weekend. Foley was flushed Sunday morning (5/4) and foley has since remained patent, with no leakage. Patient concerned about scheduled urology appointment he had scheduled prior to hospitalization on Tuesday (5/7), states his urologist says the procedure he is suppose to have cannot be done at the hospital. Will pass along to day shift nurse. Patient in bed with call bell within reach.

## 2023-08-15 NOTE — Plan of Care (Signed)
  Problem: RH Balance Goal: LTG Patient will maintain dynamic standing with ADLs (OT) Description: LTG:  Patient will maintain dynamic standing balance with assist during activities of daily living (OT)  Flowsheets (Taken 08/15/2023 1238) LTG: Pt will maintain dynamic standing balance during ADLs with: Supervision/Verbal cueing   Problem: Sit to Stand Goal: LTG:  Patient will perform sit to stand in prep for activites of daily living with assistance level (OT) Description: LTG:  Patient will perform sit to stand in prep for activites of daily living with assistance level (OT) Flowsheets (Taken 08/15/2023 1238) LTG: PT will perform sit to stand in prep for activites of daily living with assistance level: Independent with assistive device   Problem: RH Grooming Goal: LTG Patient will perform grooming w/assist,cues/equip (OT) Description: LTG: Patient will perform grooming with assist, with/without cues using equipment (OT) Flowsheets (Taken 08/15/2023 1238) LTG: Pt will perform grooming with assistance level of: Independent with assistive device    Problem: RH Bathing Goal: LTG Patient will bathe all body parts with assist levels (OT) Description: LTG: Patient will bathe all body parts with assist levels (OT) Flowsheets (Taken 08/15/2023 1238) LTG: Pt will perform bathing with assistance level/cueing: Supervision/Verbal cueing LTG: Position pt will perform bathing: Shower   Problem: RH Dressing Goal: LTG Patient will perform upper body dressing (OT) Description: LTG Patient will perform upper body dressing with assist, with/without cues (OT). Flowsheets (Taken 08/15/2023 1238) LTG: Pt will perform upper body dressing with assistance level of: Independent Goal: LTG Patient will perform lower body dressing w/assist (OT) Description: LTG: Patient will perform lower body dressing with assist, with/without cues in positioning using equipment (OT) Flowsheets (Taken 08/15/2023 1238) LTG: Pt will perform  lower body dressing with assistance level of: Supervision/Verbal cueing   Problem: RH Toileting Goal: LTG Patient will perform toileting task (3/3 steps) with assistance level (OT) Description: LTG: Patient will perform toileting task (3/3 steps) with assistance level (OT)  Flowsheets (Taken 08/15/2023 1238) LTG: Pt will perform toileting task (3/3 steps) with assistance level: Supervision/Verbal cueing   Problem: RH Toilet Transfers Goal: LTG Patient will perform toilet transfers w/assist (OT) Description: LTG: Patient will perform toilet transfers with assist, with/without cues using equipment (OT) Flowsheets (Taken 08/15/2023 1238) LTG: Pt will perform toilet transfers with assistance level of: Supervision/Verbal cueing   Problem: RH Tub/Shower Transfers Goal: LTG Patient will perform tub/shower transfers w/assist (OT) Description: LTG: Patient will perform tub/shower transfers with assist, with/without cues using equipment (OT) Flowsheets (Taken 08/15/2023 1238) LTG: Pt will perform tub/shower stall transfers with assistance level of: Supervision/Verbal cueing LTG: Pt will perform tub/shower transfers from: Walk in shower

## 2023-08-15 NOTE — Progress Notes (Signed)
 Inpatient Rehabilitation Care Coordinator Assessment and Plan Patient Details  Name: John [name] MRN: [id number] Date of Birth: 03-20-1946  Today's Date: 08/15/2023  Hospital Problems: Principal Problem:   Debility Active Problems:   BPH (benign prostatic hyperplasia)   Foley catheter in place   Cholelithiasis   Sacral pressure ulcer   DVT, femoral, chronic (HCC)   Bladder outlet obstruction   Sepsis due to Escherichia coli [facility])  Past Medical History:  Past Medical History:  Diagnosis Date   BPH (benign prostatic hyperplasia)    CTS (carpal tunnel syndrome)    Depression    Diabetes mellitus    Essential hypertension    GERD (gastroesophageal reflux disease)    History of breast cancer    Hypercholesteremia    Neuropathy    Past Surgical History:  Past Surgical History:  Procedure Laterality Date   CATARACT EXTRACTION, BILATERAL  01/18/2017   CORONARY STENT INTERVENTION N/A 08/10/2023   Procedure: CORONARY STENT INTERVENTION;  Surgeon: Wenona Hamilton, MD;  Location: [facility] INVASIVE CV LAB;  Service: Cardiovascular;  Laterality: N/A;   LEFT HEART CATH AND CORONARY ANGIOGRAPHY N/A 08/10/2023   Procedure: LEFT HEART CATH AND CORONARY ANGIOGRAPHY;  Surgeon: Wenona Hamilton, MD;  Location: [facility] INVASIVE CV LAB;  Service: Cardiovascular;  Laterality: N/A;   LITHOTRIPSY     MASTECTOMY Right 05/02/2018   PACEMAKER IMPLANT N/A 05/13/2023   Procedure: PACEMAKER IMPLANT;  Surgeon: Ardeen Kohler, MD;  Location: [facility] INVASIVE CV LAB;  Service: Cardiovascular;  Laterality: N/A;   TEMPORARY PACEMAKER N/A 05/11/2023   Procedure: TEMPORARY PACEMAKER;  Surgeon: [place], Peter M, MD;  Location: [facility] INVASIVE CV LAB;  Service: Cardiovascular;  Laterality: N/A;   TONSILLECTOMY AND ADENOIDECTOMY     Social History:  reports that he has never smoked. He has never used smokeless tobacco. He reports that he does not drink alcohol and does not use drugs.  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 Pt wants to be mod/i like he was prior to admission with his rolling walker  Social History Preferred language: English Religion:  Cultural Background: NA Education: Some college Health Literacy - How often do you need to have someone help you when you read instructions, pamphlets, or other written material from your doctor or pharmacy?: Never Writes: Yes Employment Status: Retired Marine scientist Issues: No issues Guardian/Conservator: None-according to MD pt is capable of making his own decisions while here   Abuse/Neglect Abuse/Neglect Assessment Can Be Completed: Yes Physical Abuse: Denies Verbal Abuse: Denies Sexual Abuse: Denies Exploitation of patient/patient's resources: Denies Self-Neglect: Denies  Patient response to: Social Isolation - How often do you feel lonely or isolated from those around you?: Never  Emotional Status Pt's affect, behavior and adjustment status: Pt is motivated to do well and get back to his mod/i level as he was prior to admission. It has been one thing after antoher since 04/2023 and he is hoping he will do well and stay out of the hospital. Recent Psychosocial Issues: other health issues wants foley Dc while here and to try to void on own Psychiatric History: Hx-depression takes medication and feels it helps him. He seems to be a short length  of stay here due to high level. Substance Abuse History: NA  Patient / Family Perceptions, Expectations & Goals Pt/Family understanding of illness & functional limitations: Pt is able to explain  his condtition and medical issues for being here. He talks with the MD daily and feels he understands his plan moving forward. 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, etc Anticipated changes in roles/activities/participation: resume Pt/family expectations/goals: Pt states: " I hope to do well and get out of her soon."  [employer]: Other (Comment) (Been to [facility] before) Premorbid Home Care/DME Agencies: Other (Comment) (Had HH and felt not beneficial) Transportation available at discharge: wife Is the patient able to respond to transportation needs?: Yes In the past 12 months, has lack of transportation kept you from medical appointments or from getting medications?: No In the past 12 months, has lack of transportation kept you from meetings, work, or from getting things needed for daily living?: No  Discharge Planning Living Arrangements: Spouse/significant other Support Systems: Spouse/significant other, Children, Friends/neighbors, Church/faith community Type of Residence: Private residence Insurance Resources: Media planner (specify) Theatre manager Medicare) Surveyor, quantity Resources: Tree surgeon, Family Support Financial Screen Referred: No Living Expenses: Own Money Management: Patient, Spouse Does the patient have any problems obtaining your medications?: No Home Management: wife Patient/Family Preliminary Plans: Return home with wife who is able to provide assist if needed. Pt hopes to be mod/i with his rolling walker and is very well versed  with this used prior to admission. Aware being evaluated today and goals being set for stay here Care Coordinator Anticipated Follow Up Needs: HH/OP  Clinical Impression Pleasant gentleman who is very vocal in his wants and needs. His wife is involved and assists with his care. Will await team's evaluation and work on discharge needs.  Mardell Shade 08/15/2023, 10:09 AM

## 2023-08-15 NOTE — Progress Notes (Addendum)
 Initial Nutrition Assessment  DOCUMENTATION CODES:   Severe malnutrition in context of chronic illness  INTERVENTION:   Ensure Enlive po once daily, each supplement provides 350 kcal and 20 grams of protein.  NUTRITION DIAGNOSIS:   Severe Malnutrition related to chronic illness as evidenced by severe muscle depletion, percent weight loss (16.5% weight loss within 6 months).  GOAL:   Patient will meet greater than or equal to 90% of their needs  MONITOR:   PO intake, Supplement acceptance  REASON FOR ASSESSMENT:   Consult Assessment of nutrition requirement/status, Poor PO  ASSESSMENT:   78 yo male admitted with functional deficits d/t debility after urosepsis and NSTEMI. PMH includes DM, HLD, HTN, breast cancer, depression, GERD, neuropathy, BPH S/P Foley placement, CVA w/ L hemiparesis, chronic sacral wound.  Patient reports that he does not like the hospital food. Nutrition services ambassador assisted patient with meal orders yesterday and he has been eating better since receiving food he likes. He endorses a lot of weight loss over the past year. He can tell he's lost a lot of muscle mass. He likes Ensure supplements, refuses to drink Glucerna shakes.   Currently on a regular diet; meal intakes documented at 25-100%.   Labs reviewed. K 3.4 CBG: 156-387 today  Medications reviewed and include vitamin B-12, novolog , senokot-s, spironolactone , flomax .  Weight history reviewed. Patient has had 16.5% weight loss within the past 4.5 months. Weight continues to trend downward. Currently 89.8 kg, down from 91.7 kg during acute hospitalization.   Patient meets criteria for severe malnutrition, given severe depletion of muscle mass and significant weight loss.  NUTRITION - FOCUSED PHYSICAL EXAM:  Flowsheet Row Most Recent Value  Orbital Region Mild depletion  Upper Arm Region Mild depletion  Thoracic and Lumbar Region No depletion  Buccal Region Mild depletion  Temple  Region Mild depletion  Clavicle Bone Region Moderate depletion  Clavicle and Acromion Bone Region Moderate depletion  Scapular Bone Region Moderate depletion  Dorsal Hand Moderate depletion  Patellar Region Severe depletion  Anterior Thigh Region Severe depletion  Posterior Calf Region Severe depletion  Edema (RD Assessment) None  Hair Reviewed  Eyes Reviewed  Mouth Reviewed  Skin Reviewed  Nails Reviewed       Diet Order:   Diet Order             Diet regular Room service appropriate? Yes; Fluid consistency: Thin  Diet effective now                   EDUCATION NEEDS:   Education needs have been addressed  Skin:  Skin Integrity Issues:: Stage I Stage I: sacrum  Last BM:  4/30  Height:   Ht Readings from Last 1 Encounters:  08/13/23 6' (1.829 m)    Weight:   Wt Readings from Last 1 Encounters:  08/13/23 89.8 kg    Ideal Body Weight:  80.9 kg  BMI:  Body mass index is 26.85 kg/m.  Estimated Nutritional Needs:   Kcal:  2100-2300  Protein:  110-125 gm  Fluid:  2.1-2.3 L   Barnet Boots RD, LDN, CNSC Contact via secure chat. If unavailable, use group chat "RD Inpatient."

## 2023-08-15 NOTE — Progress Notes (Signed)
 PROGRESS NOTE   Subjective/Complaints: Concerned that he will miss his urology appointment tomorrow, discussed with PA Dan who has contacted urology to reschedule appointment  ROS: Patient denies fever, rash, sore throat, blurred vision, dizziness, nausea, vomiting, diarrhea, cough, shortness of breath or chest pain, joint or back/neck pain, headache, or mood change.    Objective:   No results found. Recent Labs    08/12/23 1019 08/15/23 0540  WBC 7.4 7.5  HGB 10.8* 10.3*  HCT 33.7* 31.8*  PLT 276 322   Recent Labs    08/13/23 0937 08/15/23 0540  NA 139 138  K 3.6 3.4*  CL 103 106  CO2 27 27  GLUCOSE 207* 183*  BUN 5* 6*  CREATININE 0.88 0.81  CALCIUM  9.2 8.7*    Intake/Output Summary (Last 24 hours) at 08/15/2023 0951 Last data filed at 08/15/2023 6213 Gross per 24 hour  Intake 594 ml  Output 2425 ml  Net -1831 ml     Pressure Injury 08/06/23 Sacrum Medial Stage 1 -  Intact skin with non-blanchable redness of a localized area usually over a bony prominence. (Active)  08/06/23 2250  Location: Sacrum  Location Orientation: Medial  Staging: Stage 1 -  Intact skin with non-blanchable redness of a localized area usually over a bony prominence.  Wound Description (Comments):   Present on Admission: Yes    Physical Exam: Vital Signs Blood pressure 129/65, pulse 70, temperature 98.6 F (37 C), resp. rate 16, height 6' (1.829 m), weight 89.8 kg, SpO2 96%.  General: Alert and oriented x 3, No apparent distress HEENT: Head is normocephalic, atraumatic, PERRLA, EOMI, sclera anicteric, oral mucosa pink and moist, dentition intact, ext ear canals clear,  Neck: Supple without JVD or lymphadenopathy Heart: Reg rate and rhythm. No murmurs rubs or gallops Chest: CTA bilaterally without wheezes, rales, or rhonchi; no distress Abdomen: Soft, non-tender, non-distended, bowel sounds positive. Extremities: No clubbing,  cyanosis, or edema. Pulses are 2+ Psych: Pt's affect is appropriate. Pt is cooperative Skin: some chronic changes in LE's. A few scattered abrasions and bruises. Pacer upper left chest wall. Uro: foley with clear, yellow urine Neuro:  Alert and oriented x 3. Normal insight and awareness. Intact Memory. Normal language and speech. Cranial nerve exam unremarkable. MMT: BUE 4/5 deltoids,4+ biceps, triceps, hands and wrists. BLE 4/5 HF, KE and 4+/5 ADF/PF. Sensory exam normal for light touch and pain in all 4 limbs. No limb ataxia or cerebellar signs. No abnormal tone appreciated.  Stable 5/5  Musculoskeletal: Full ROM, No pain with AROM or PROM in the neck, trunk, or extremities. Posture appropriate     Assessment/Plan: 1. Functional deficits which require 3+ hours per day of interdisciplinary therapy in a comprehensive inpatient rehab setting. Physiatrist is providing close team supervision and 24 hour management of active medical problems listed below. Physiatrist and rehab team continue to assess barriers to discharge/monitor patient progress toward functional and medical goals  Care Tool:  Bathing              Bathing assist       Upper Body Dressing/Undressing Upper body dressing        Upper body assist  Lower Body Dressing/Undressing Lower body dressing            Lower body assist       Toileting Toileting    Toileting assist       Transfers Chair/bed transfer  Transfers assist           Locomotion Ambulation   Ambulation assist              Walk 10 feet activity   Assist           Walk 50 feet activity   Assist           Walk 150 feet activity   Assist           Walk 10 feet on uneven surface  activity   Assist           Wheelchair     Assist               Wheelchair 50 feet with 2 turns activity    Assist            Wheelchair 150 feet activity     Assist           Blood pressure 129/65, pulse 70, temperature 98.6 F (37 C), resp. rate 16, height 6' (1.829 m), weight 89.8 kg, SpO2 96%.  Medical Problem List and Plan: 1. Functional deficits secondary to debility after urosepsis, NSTEMI             -pt is DNR-limited             -patient may  shower             -ELOS/Goals: 7-8 days, goals mod I to supervision  -Patient is beginning CIR therapies today including PT and OT  2.  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 Pain Management: fairly well controlled at present.              -tylenol  prn 4. Mood/Behavior/Sleep: team provide ego support             -trazodone  prn for sleep             -antipsychotic agents: n/a 5. Neuropsych/cognition: This patient is capable of making decisions on his own behalf. 6. 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 Fluids/Electrolytes/Nutrition: pt's intake has been inconsistent             -reviewd importance of po intake with pt             -will ask RD to follow along as well, protein supps             -5/4 will liberalize to regular diet to encourage better intake but will need to watch what he eats given gall bladder             -I personally reviewed the patient's labs today.    8. Urosepsis/ E Coli bacteremia             -  pt completed ancef  on acute/last night             -5/4-begin augmentin  today per ID and continue for 8 days for a total of 2 weeks of antibiotics 9. BPH with bladder outlet obstruction             -chronic foley, urology consulted on acute             -pt will see urology as outpt in follow up             -flush catheter as needed for obstruction or any  leakage--order is in             -flomax , proscar              -hyoscyamine  for bladder spasms  Flomax  changed to HS since patient only wants to take 5 medications at 1 time  10. NSTEMI/CAD             -pt with 3 vessel disease, s/p angioplasty/stent LAD             -plavix , no asa as on eliquis  for DVT             -continue spironolactone , coreg  changed to HS only since patient only wants to take 5 medications at one time             -monitor bps, weights and with increased activity during therapy  11. Cholelithiasis, biliary diskynesia, chronic cholecystitis             -Cholecystectomy postponed d/t MI             -elective chole in 6 mos             -abx coverage x 1 week recommended (augmentin )  12. HTN             Well controlled, decrease Coreg  to HS since patient only wants to take 5 medications at one time  13. Chronic anemia             -d/c iron supplement given constipation and since patient only wants to take 5 medications at a time    LOS: 2 days A FACE TO FACE EVALUATION WAS PERFORMED  John [name] 08/15/2023, 9:51 AM

## 2023-08-16 DIAGNOSIS — R5381 Other malaise: Secondary | ICD-10-CM | POA: Diagnosis not present

## 2023-08-16 DIAGNOSIS — I959 Hypotension, unspecified: Secondary | ICD-10-CM

## 2023-08-16 DIAGNOSIS — N400 Enlarged prostate without lower urinary tract symptoms: Secondary | ICD-10-CM

## 2023-08-16 LAB — GLUCOSE, CAPILLARY
Glucose-Capillary: 191 mg/dL — ABNORMAL HIGH (ref 70–99)
Glucose-Capillary: 226 mg/dL — ABNORMAL HIGH (ref 70–99)
Glucose-Capillary: 259 mg/dL — ABNORMAL HIGH (ref 70–99)

## 2023-08-16 MED ORDER — METFORMIN HCL 500 MG PO TABS
500.0000 mg | ORAL_TABLET | Freq: Every day | ORAL | Status: DC
  Administered 2023-08-17: 500 mg via ORAL
  Filled 2023-08-16: qty 1

## 2023-08-16 NOTE — Progress Notes (Signed)
 Pt refused multiple medications including antibiotic. Explained importance of taking medication pt continued to refuse and refused to allow staff to check CBG.

## 2023-08-16 NOTE — Progress Notes (Signed)
 Occupational Therapy Session Note  Patient Details  Name: John [name] MRN: [id number] Date of Birth: [birth year]  Today's Date: 08/16/2023 OT Individual Time: 4098-1191 OT Individual Time Calculation (min): 42 min  OT Individual Time: 4782-9562 OT Individual Time Calculation (min): 69 min   Short Term Goals: Week 1:  OT Short Term Goal 1 (Week 1): STG=LTG d/t ELOS  Skilled Therapeutic Interventions/Progress Updates:     AM Session: Pt received sleeping in bed waking upon OT arrival. Pt presenting to be in fatigued from previous PT sessions, however in good spirits receptive to skilled OT session reporting 4/10 pain d/t HA- OT offering intermittent rest breaks, repositioning, and therapeutic support to optimize participation in therapy session. Rn notified and in/out during therapy session. Focused this session on Pt education, d/c planning, and activity tolerance. Spent large portion of time discussing d/c plans and purpose of IPR stay with Pt. Pt reporting he feels like he is ready to go home in a couple of days with education provided on purpose of therapy so increase independence in BADLs, improve balance, and increase activity tolerance in preparation for d/c. Pt noted to have had numerous falls prior to hospitalization with in-depth education provided on fall prevention, energy conservation, and fall recovery techniques- did not practice fall recovery during session d/t Pt reporting he is too fatigued to try this session. Pt expressing frustration about hospital food- provided education on room phone usage and heart healthy diet. Pt receptive to education and demonstrating teach back as evidence of learning ordering lunch with appropriate food choices of [place] sandwich with side salad. Engaged Pt in completing functional mobility ~100 ft x2 trials through busy hallways for endurance training and to work on safety awareness with RW. Pt able to complete task with close supervision/CGA  +increased time d/t delayed gait speed- Pt with appropriate safety awareness and problem solving this session. Worked on blocked practice of sit<>stands from EOM to increase LB strength and improve safety with hand placement/body mechanics during sit <>stands- Pt completed total of 6 with close supervision and mod questioning cues required to recall hand placement. Pt was left resting in recliner with call bell in reach, chair alarm on, and all needs met.    PM Session:  Pt received sleeping in bed waking upon OT arrival. Pt receptive to skilled OT session reporting 0/10 pain- OT offering intermittent rest breaks, repositioning, and therapeutic support to optimize participation in therapy session. Pt requesting to shower this PM- focused this session on BADL retraining with emphasis on safety awareness and activity tolerance. Supine > EOB mod I using bed rail with HOB elevated. Pt able to complete ambulatory transfer to TTB positioned in walk-in shower with supervision- min verbal cues provided for RW positioning when stepping into shower. Pt doffed clothing in seated/standing position while holding onto grab bar supervision. Provided education on energy conservation techniques, fall prevention, and bathroom safety as it relates to bathing tasks with Pt receptive to education. Pt able to complete majority of shower in seated position for EC, standing while holding grab bar to wash buttocks only without LOB noted- increased time provided. Following shower, U/LB dressing completed sitting EOB. Provided education on technique for weaving catheter bag through brief/pants with Pt demonstrating teach back as evidence of learning donning pants with supervision and shirt mod I. Engaged Pt in completing grooming/hygiene tasks in standing position at sink using RW for increased endurance challenge with Pt able to groom hair, brush teeth, and apply lotion without LOB.  Pt fatigued at end of session, requesting to return to  bed. EOB > supine mod I using bed rail and HOB elevated. Pt with improved safety awareness, activity tolerance, and activity pacing this session. Pt was left resting in bed with call bell in reach, bed alarm on, and all needs met.    Therapy Documentation Precautions:  Precautions Precautions: Fall Recall of Precautions/Restrictions: Intact Precaution/Restrictions Comments: angioplasty and stent placement to the mid LAD, stent in LLE, foley cath, pacemaker, low activity tolerance, watch BP, hx of breast CA Restrictions Weight Bearing Restrictions Per Provider Order: No   Therapy/Group: Individual Therapy  Geoffery Kiel 08/16/2023, 8:01 AM

## 2023-08-16 NOTE — Progress Notes (Signed)
 PROGRESS NOTE   Subjective/Complaints: Augmentin  causes abdominal pain, consulted ID and there are no alternative antibiotics they recommend Patient asks when urology will come  ROS: +abdominal pain   Objective:   No results found. Recent Labs    08/15/23 0540  WBC 7.5  HGB 10.3*  HCT 31.8*  PLT 322   Recent Labs    08/15/23 0540  NA 138  K 3.4*  CL 106  CO2 27  GLUCOSE 183*  BUN 6*  CREATININE 0.81  CALCIUM  8.7*    Intake/Output Summary (Last 24 hours) at 08/16/2023 1506 Last data filed at 08/16/2023 1355 Gross per 24 hour  Intake 477 ml  Output 1250 ml  Net -773 ml     Pressure Injury 08/06/23 Sacrum Medial Stage 1 -  Intact skin with non-blanchable redness of a localized area usually over a bony prominence. (Active)  08/06/23 2250  Location: Sacrum  Location Orientation: Medial  Staging: Stage 1 -  Intact skin with non-blanchable redness of a localized area usually over a bony prominence.  Wound Description (Comments):   Present on Admission: Yes    Physical Exam: Vital Signs Blood pressure (!) 114/52, pulse 75, temperature 98.6 F (37 C), resp. rate 17, height 6' (1.829 m), weight 89.8 kg, SpO2 95%.  General: Alert and oriented x 3, No apparent distress HEENT: Head is normocephalic, atraumatic, PERRLA, EOMI, sclera anicteric, oral mucosa pink and moist, dentition intact, ext ear canals clear,  Neck: Supple without JVD or lymphadenopathy Heart: Reg rate and rhythm. No murmurs rubs or gallops Chest: CTA bilaterally without wheezes, rales, or rhonchi; no distress Abdomen: Soft, non-tender, non-distended, bowel sounds positive. Extremities: No clubbing, cyanosis, or edema. Pulses are 2+ Psych: Pt's affect is appropriate. Pt is cooperative Skin: some chronic changes in LE's. A few scattered abrasions and bruises. Pacer upper left chest wall. Uro: foley with clear, yellow urine Neuro:  Alert and  oriented x 3. Normal insight and awareness. Intact Memory. Normal language and speech. Cranial nerve exam unremarkable. MMT: BUE 4/5 deltoids,4+ biceps, triceps, hands and wrists. BLE 4/5 HF, KE and 4+/5 ADF/PF. Sensory exam normal for light touch and pain in all 4 limbs. No limb ataxia or cerebellar signs. No abnormal tone appreciated.  Stable 5/5  Musculoskeletal: Full ROM, No pain with AROM or PROM in the neck, trunk, or extremities. Posture appropriate     Assessment/Plan: 1. Functional deficits which require 3+ hours per day of interdisciplinary therapy in a comprehensive inpatient rehab setting. Physiatrist is providing close team supervision and 24 hour management of active medical problems listed below. Physiatrist and rehab team continue to assess barriers to discharge/monitor patient progress toward functional and medical goals  Care Tool:  Bathing    Body parts bathed by patient: Right arm, Left arm, Chest, Abdomen, Front perineal area, Right upper leg, Left upper leg, Right lower leg, Left lower leg, Face, Buttocks   Body parts bathed by helper: Buttocks     Bathing assist Assist Level: Supervision/Verbal cueing     Upper Body Dressing/Undressing Upper body dressing   What is the patient wearing?: Pull over shirt    Upper body assist Assist Level: Independent  Lower Body Dressing/Undressing Lower body dressing      What is the patient wearing?: Underwear/pull up, Incontinence brief     Lower body assist Assist for lower body dressing: Supervision/Verbal cueing     Toileting Toileting    Toileting assist Assist for toileting: Supervision/Verbal cueing     Transfers Chair/bed transfer  Transfers assist     Chair/bed transfer assist level: Supervision/Verbal cueing     Locomotion Ambulation   Ambulation assist      Assist level: Supervision/Verbal cueing Assistive device: Walker-rolling Max distance: 150'   Walk 10 feet activity   Assist      Assist level: Supervision/Verbal cueing Assistive device: Walker-rolling   Walk 50 feet activity   Assist    Assist level: Supervision/Verbal cueing Assistive device: Walker-rolling    Walk 150 feet activity   Assist    Assist level: Supervision/Verbal cueing Assistive device: Walker-rolling    Walk 10 feet on uneven surface  activity   Assist Walk 10 feet on uneven surfaces activity did not occur: Safety/medical concerns   Assist level: Supervision/Verbal cueing Assistive device: Walker-rolling   Wheelchair     Assist Is the patient using a wheelchair?: No Type of Wheelchair: Manual    Wheelchair assist level: Dependent - Patient 0% Max wheelchair distance: 200 ft    Wheelchair 50 feet with 2 turns activity    Assist        Assist Level: Dependent - Patient 0%   Wheelchair 150 feet activity     Assist      Assist Level: Dependent - Patient 0%   Blood pressure (!) 114/52, pulse 75, temperature 98.6 F (37 C), resp. rate 17, height 6' (1.829 m), weight 89.8 kg, SpO2 95%.  Medical Problem List and Plan: 1. Functional deficits secondary to debility after urosepsis, NSTEMI             -pt is DNR-limited             -patient may  shower             -ELOS/Goals: 7-8 days, goals mod I to supervision  Continue CIR  2.  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 Pain Management: fairly well controlled at present.              -tylenol  prn  4. Mood/Behavior/Sleep: team provide ego support             -trazodone  prn for sleep             -antipsychotic agents: n/a 5. Neuropsych/cognition: This patient is capable of making decisions on his own behalf.  6. 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 Fluids/Electrolytes/Nutrition: pt's intake has been inconsistent             -reviewd importance of po intake with pt             -will ask RD to follow along as well, protein supps             -  5/4 will liberalize to regular diet to encourage better intake but will need to watch what he eats given gall bladder             -I personally reviewed the patient's labs today.    8. Urosepsis/ E Coli bacteremia             -pt completed ancef  on acute/last night             -5/4-begin augmentin  today per ID and continue for 8 days for a total of 2 weeks of antibiotics  5/6: consulted with ID since Augmentin  causes GI distress but they do not recommend alternative antibiotics  9. BPH with bladder outlet obstruction             -continue chronic foley, urology consulted on acute             -pt will see urology as outpt in follow up             -flush catheter as needed for obstruction or any leakage--order is in             -flomax , proscar              -hyoscyamine  for bladder spasms  Flomax  changed to HS since patient only wants to take 5 medications at 1 time  10. NSTEMI/CAD             -pt with 3 vessel disease, s/p angioplasty/stent LAD             -plavix , no asa as on eliquis  for DVT             -continue spironolactone , coreg  changed to HS only since patient only wants to take 5 medications at one time             -monitor bps, weights and with increased activity during therapy  11. Cholelithiasis, biliary diskynesia, chronic cholecystitis             -Cholecystectomy postponed d/t MI             -elective chole in 6 mos             -abx coverage x 1 week recommended (augmentin )  12. Hypotension: d/c Coreg   13. Chronic anemia             -d/c iron supplement given constipation and since patient only wants to take 5 medications at a time    LOS: 3 days A FACE TO FACE EVALUATION WAS  PERFORMED  Lavell Portugal P Julita Ozbun 08/16/2023, 3:06 PM

## 2023-08-16 NOTE — Plan of Care (Signed)
  Problem: Consults Goal: RH SPINAL CORD INJURY PATIENT EDUCATION Description:  See Patient Education module for education specifics.  Outcome: Progressing   Problem: SCI BOWEL ELIMINATION Goal: RH STG MANAGE BOWEL WITH ASSISTANCE Description: STG Manage Bowel with supervision Assistance. Outcome: Progressing   Problem: SCI BLADDER ELIMINATION Goal: RH STG MANAGE BLADDER WITH ASSISTANCE Description: STG Manage Bladder With  supervision Assistance Outcome: Progressing   Problem: RH SKIN INTEGRITY Goal: RH STG SKIN FREE OF INFECTION/BREAKDOWN Description: Manage skin free of infection/breakdown with supervision assistance Outcome: Progressing   Problem: RH SAFETY Goal: RH STG ADHERE TO SAFETY PRECAUTIONS W/ASSISTANCE/DEVICE Description: STG Adhere to Safety Precautions With  supervision Assistance/Device. Outcome: Progressing   Problem: RH PAIN MANAGEMENT Goal: RH STG PAIN MANAGED AT OR BELOW PT'S PAIN GOAL Description: <4 w/ prns Outcome: Progressing   Problem: RH KNOWLEDGE DEFICIT SCI Goal: RH STG INCREASE KNOWLEDGE OF SELF CARE AFTER SCI Description: Manage increase knowledge of self care after SCI with supervision from mother using educational materials provided Outcome: Progressing

## 2023-08-16 NOTE — Progress Notes (Signed)
 Physical Therapy Session Note  Patient Details  Name: John [name] MRN: [id number] Date of Birth: 02/19/1946  Today's Date: 08/16/2023 PT Individual Time: 0803-0912 PT Individual Time Calculation (min): 69 min   Short Term Goals: Week 1:  PT Short Term Goal 1 (Week 1): STG = LTG d/t ELOS  Skilled Therapeutic Interventions/Progress Updates:      Pt in bed to start - in agreement to therapy session. He has no reports of pain. He requests to shave at the sink before leaving his room. Bed mobility completed with supervision. PT managing catheter bag for safety. Sit<>Stand to RW with supervision and ambulates with CGA to the sink. While standing, he completes shaving his face and oral care - leans on sink for stability but no LOB or knee buckling observed. Adequate safety awareness while managing the razor and no perceptual or apraxia deficits noted.  Pt reports readiness and eagerness to return home - discussed potential barriers and need for support - patient reports his wife is home and can provide the necessary care needed. Pt reports feeling near his baseline mobility.   Pt ambulates at supervision level using the RW from his room to main gym, ~125ft. Cues for upright posture and forward gaze since he keeps a flexed posture. Also raised RW height to better fit patient.   Stair training using 6" steps and 2 hand rails completed at supervision level - able to navigate x12 stairs with a reciprocal stepping pattern for both ascent and descent. Adequate safety awareness and control during stair navigation.   Pt instructed in alternating toe taps to 4" block using RW support and 2# ankle weights bilaterally - 1x15 each. Supervision for safety and cues.   Pt reporting some chronic posterior beck pain - likely related to his forward head posture and flexed cervical spine. Patient positioned in hooklying/supine position using moist heat pack for his cervical neck. While this was on, worked on BLE  strengthening using 2# ankle weights bilaterally: -1x12 bridges -1x12 knee to chest unilaterally at a time -1x12 alternating heel slides -1x12 glut sets Heating pack removed and skin c/d/I with some mild redness.   Pt requesting to be transported back to his room at w/c level due to fatigue. CGA for stand pivot transfer using no AD. Able to get into bed without assist. Left with his needs met. Pt aware of upcoming therapy schedule.   Therapy Documentation Precautions:  Precautions Precautions: Fall Recall of Precautions/Restrictions: Intact Precaution/Restrictions Comments: angioplasty and stent placement to the mid LAD, stent in LLE, foley cath, pacemaker, low activity tolerance, watch BP, hx of breast CA Restrictions Weight Bearing Restrictions Per Provider Order: No General:      Therapy/Group: Individual Therapy  Pheobe Brass 08/16/2023, 7:55 AM

## 2023-08-16 NOTE — Evaluation (Signed)
 Physical Therapy Assessment and Plan  Patient Details  Name: John [name] MRN: [id number] Date of Birth: Dec 31, 1945  PT Diagnosis: Difficulty walking, Hemiparesis non-dominant, and Muscle weakness Rehab Potential: Good ELOS: 6-9 days   Today's Date: 08/16/2023 PT Individual Time:  -       Hospital Problem: Principal Problem:   Debility Active Problems:   BPH (benign prostatic hyperplasia)   Foley catheter in place   Cholelithiasis   Sacral pressure ulcer   DVT, femoral, chronic (HCC)   Bladder outlet obstruction   Sepsis due to Escherichia coli [facility])   Past Medical History:  Past Medical History:  Diagnosis Date   BPH (benign prostatic hyperplasia)    CTS (carpal tunnel syndrome)    Depression    Diabetes mellitus    Essential hypertension    GERD (gastroesophageal reflux disease)    History of breast cancer    Hypercholesteremia    Neuropathy    Past Surgical History:  Past Surgical History:  Procedure Laterality Date   CATARACT EXTRACTION, BILATERAL  01/18/2017   CORONARY STENT INTERVENTION N/A 08/10/2023   Procedure: CORONARY STENT INTERVENTION;  Surgeon: Wenona Hamilton, MD;  Location: [facility] INVASIVE CV LAB;  Service: Cardiovascular;  Laterality: N/A;   LEFT HEART CATH AND CORONARY ANGIOGRAPHY N/A 08/10/2023   Procedure: LEFT HEART CATH AND CORONARY ANGIOGRAPHY;  Surgeon: Wenona Hamilton, MD;  Location: [facility] INVASIVE CV LAB;  Service: Cardiovascular;  Laterality: N/A;   LITHOTRIPSY     MASTECTOMY Right 05/02/2018   PACEMAKER IMPLANT N/A 05/13/2023   Procedure: PACEMAKER IMPLANT;  Surgeon: Ardeen Kohler, MD;  Location: [facility] INVASIVE CV LAB;  Service: Cardiovascular;  Laterality: N/A;   TEMPORARY PACEMAKER N/A 05/11/2023   Procedure: TEMPORARY PACEMAKER;  Surgeon: [place], Peter M, MD;  Location: [facility] INVASIVE CV LAB;  Service: Cardiovascular;  Laterality: N/A;   TONSILLECTOMY AND ADENOIDECTOMY      Assessment & Plan Clinical Impression: Patient is a 78 y.o. 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 He presented to [facility] on 08/06/2023 due to these persistent problems. Urinalysis was concerning for signs of infection and the patient was ultimately found to have E. Coli and Klebsiella UTI with sepsis. Further imaging was also concerning for cholelithiasis as well with HIDA scan showing chronic cholecystitis and biliary akinesis. Patient was placed on IV Ancef  for treatment of his urinary tract infection. Blood cultures remain negative to date. Patient was evaluated by urology on 08/07/2023 who did not recommend a Foley exchange. On 08/10/2023 patient experienced a hypotensive episode with associated diaphoresis. EKG demonstrated T wave changes and elevated troponin was found as well consistent with a NSTEMI. Patient was taken for a cardiac cath which demonstrated a left dominant coronary arteries with significant three-vessel coronary artery disease. The patient underwent angioplasty and drug-eluting stent placement to the mid LAD. He was started on Plavix  and aspirin . He may discontinue aspirin  once Eliquis  is resumed. Patient was also placed on carvedilol  and spironolactone  by cardiology. As far as his cholelithiasis and cholecystitis a HIDA scan on 08/08/2023 demonstrated a ejection fraction of 4% consistent with chronic cholecystitis and biliary dyskinesia. Surgery had initially planned for a  cholecystectomy but given his MI the plan now is for elective cholecystectomy in about 6 months. Recommendation was 1 week with antibiotics per surgery. Patient also been dealing with a chronic sacral wound which his wife had been  managing at home. The patient told me it was essentially resolved. Given the patient's medical history and subsequent weakness as a result he was seen by therapies and is demonstrated functional and physical needs. Physical medicine rehab was consulted for assessment and determined that the patient could potentially benefit from an inpatient rehab admission to address these deficits. Patient was ultimately admitted today. Patient transferred to CIR on 08/13/2023 .   Patient currently requires min assist with mobility secondary to muscle weakness, decreased cardiorespiratoy endurance, unbalanced muscle activation, decreased safety awareness and decreased memory, and decreased standing balance and decreased balance strategies.  Prior to hospitalization, patient was modified independent  with mobility and lived with Spouse in a House.  Home access is 4Stairs to enter.  Patient will benefit from skilled PT intervention to maximize safe functional mobility, minimize fall risk, and decrease caregiver burden for planned discharge home with 24 hour supervision.  Anticipate patient will benefit from follow up HH at discharge.  PT - End of Session Activity Tolerance: Tolerates 30+ min activity with multiple rests Endurance Deficit: Yes PT Assessment Rehab Potential (ACUTE/IP ONLY): Good PT Barriers to Discharge: [place] home environment;Decreased caregiver support;Incontinence;Insurance for SNF coverage;Pending surgery PT Patient demonstrates impairments in the following area(s): Balance;Behavior;Endurance;Motor;Nutrition;Pain;Safety;Sensory PT Transfers Functional Problem(s): Bed Mobility;Bed to Chair;Furniture;Car PT Locomotion Functional Problem(s): Ambulation;Stairs PT Plan PT Intensity: Minimum of 1-2 x/day ,45 to 90 minutes PT Frequency: 5 out of 7 days PT Duration Estimated Length of Stay: 6-9 days PT Treatment/Interventions: Ambulation/gait training;Cognitive  remediation/compensation;Discharge planning;DME/adaptive equipment instruction;Functional mobility training;Pain management;Psychosocial support;Splinting/orthotics;Therapeutic Activities;UE/LE Strength taining/ROM;UE/LE Coordination activities;Therapeutic Exercise;Stair training;Skin care/wound management;Patient/family education;Neuromuscular re-education;Disease management/prevention;Community reintegration;Balance/vestibular training PT Transfers Anticipated Outcome(s): Mod I PT Locomotion Anticipated Outcome(s): Mod I/ supervision PT Recommendation Recommendations for Other Services: Therapeutic Recreation consult Therapeutic Recreation Interventions: Kitchen group;Stress management Follow Up Recommendations: Home health PT;24 hour supervision/assistance Patient destination: Home Equipment Recommended: To be determined   PT Evaluation Precautions/Restrictions Precautions Precautions: Fall Recall of Precautions/Restrictions: Intact Precaution/Restrictions Comments: foley cath, pacemaker, low activity tolerance, watch BP, hx of breast CA Restrictions Weight Bearing Restrictions Per Provider Order: No  Pain Pain Assessment Pain Scale: 0-10 Pain Score: 0-No pain Pain Interference Pain Interference Pain Effect on Sleep: 0. Does not apply - I have not had any pain or hurting in the past 5 days Pain Interference with Therapy Activities: 0. Does not apply - I have not received rehabilitationtherapy in the past 5 days Pain Interference with Day-to-Day Activities: 1. Rarely or not at all Home Living/Prior Functioning Home Living Available Help at Discharge: Family;Available 24 hours/day Type of Home: House Home Access: Stairs to enter [employer] of Steps: 4 Entrance Stairs-Rails: Right;Left;Can reach both Home Layout: Two level;Able to live on main level with bedroom/bathroom Alternate Level Stairs-Number of Steps: 14 Alternate Level Stairs-Rails: Right;Left;Can reach  both Bathroom Shower/Tub: Walk-in Contractor: Handicapped height Bathroom Accessibility: No  Lives With: Spouse Prior Function Level of Independence: Independent with gait;Requires assistive device for independence;Independent with basic ADLs;Independent with transfers  Able to Take Stairs?: Yes (only takes the stairs into the home) Driving: No (wife drives) Vocation: Retired Optometrist - History Ability to See in Adequate Light: 0 Adequate Perception Perception: Within Functional Limits Praxis Praxis: WFL  Cognition Overall Cognitive Status: Within  Functional Limits for tasks assessed Arousal/Alertness: Awake/alert Orientation Level: Oriented X4 Selective Attention: Appears intact Memory: Impaired (baseline impairment) Awareness: Appears intact Problem Solving: Impaired Behaviors: Other (comment) (complains about hx of health issues) Safety/Judgment: Impaired Sensation Sensation Light Touch: Appears Intact Hot/Cold: Appears Intact Coordination Gross Motor Movements are Fluid and Coordinated: No Fine Motor Movements are Fluid and Coordinated: No Heel Shin Test: slow but WFL RLE, limited range with LLE Motor  Motor Motor: Other (comment) Motor - Skilled Clinical Observations: weakness 2/2 generalized deconditioning, LLE weaker than RLE   Trunk/Postural Assessment  Cervical Assessment Cervical Assessment: Exceptions to [facility] (forward head) Thoracic Assessment Thoracic Assessment: Exceptions to [facility] (rounded shoulders with mild kyphosis) Lumbar Assessment Lumbar Assessment: Exceptions to [facility] (posterior pelvic tilt) Postural Control Postural Control: Deficits on evaluation Trunk Control: posterior bias during transfers, relies on Bil knee extension into seat to steady self  Balance Balance Balance Assessed: Yes Static Sitting Balance Static Sitting - Balance Support: Feet supported Static Sitting - Level of Assistance: 6: Modified  independent (Device/Increase time) Dynamic Sitting Balance Dynamic Sitting - Balance Support: Feet supported Dynamic Sitting - Level of Assistance: 5: Stand by assistance Static Standing Balance Static Standing - Balance Support: During functional activity;Bilateral upper extremity supported Static Standing - Level of Assistance: 5: Stand by assistance Dynamic Standing Balance Dynamic Standing - Balance Support: Left upper extremity supported;During functional activity;Bilateral upper extremity supported Dynamic Standing - Level of Assistance: Other (comment) (CGA/ MinA) Extremity Assessment      RLE Assessment RLE Assessment: Exceptions to [facility] General Strength Comments: grossly 4- to 4/ 5 LLE Assessment LLE Assessment: Exceptions to [facility] General Strength Comments: grossly 3+ to 4-/ 5  Care Tool Care Tool Bed Mobility Roll left and right activity   Roll left and right assist level: Independent with assistive device    Sit to lying activity   Sit to lying assist level: Contact Guard/Touching assist    Lying to sitting on side of bed activity   Lying to sitting on side of bed assist level: the ability to move from lying on the back to sitting on the side of the bed with no back support.: Minimal Assistance - Patient > 75%     Care Tool Transfers Sit to stand transfer   Sit to stand assist level: Minimal Assistance - Patient > 75%    Chair/bed transfer   Chair/bed transfer assist level: Minimal Assistance - Patient > 75%    Car transfer   Car transfer assist level: Minimal Assistance - Patient > 75%      Care Tool Locomotion Ambulation   Assist level: Contact Guard/Touching assist Assistive device: Walker-rolling Max distance: 183 ft  Walk 10 feet activity   Assist level: Supervision/Verbal cueing Assistive device: Walker-rolling   Walk 50 feet with 2 turns activity   Assist level: Contact Guard/Touching assist Assistive device: Walker-rolling  Walk 150 feet  activity   Assist level: Contact Guard/Touching assist Assistive device: Walker-rolling  Walk 10 feet on uneven surfaces activity Walk 10 feet on uneven surfaces activity did not occur: Safety/medical concerns      Stairs   Assist level: Minimal Assistance - Patient > 75% Stairs assistive device: 2 hand rails Max number of stairs: 4  Walk up/down 1 step activity   Walk up/down 1 step (curb) assist level: Contact Guard/Touching assist Walk up/down 1 step or curb assistive device: 2 hand rails  Walk up/down 4 steps activity   Walk up/down 4 steps assist level: Minimal Assistance -  Patient > 75% Walk up/down 4 steps assistive device: 2 hand rails  Walk up/down 12 steps activity Walk up/down 12 steps activity did not occur: Safety/medical concerns      Pick up small objects from floor Pick up small object from the floor (from standing position) activity did not occur: Safety/medical concerns      Wheelchair Is the patient using a wheelchair?: Yes Type of Wheelchair: Manual   Wheelchair assist level: Dependent - Patient 0% Max wheelchair distance: 200 ft  Wheel 50 feet with 2 turns activity   Assist Level: Dependent - Patient 0%  Wheel 150 feet activity   Assist Level: Dependent - Patient 0%    Refer to Care Plan for Long Term Goals  SHORT TERM GOAL WEEK 1 PT Short Term Goal 1 (Week 1): STG = LTG d/t ELOS  Recommendations for other services: Therapeutic Recreation  Kitchen group and Stress management  Skilled Therapeutic Intervention Mobility Bed Mobility Bed Mobility: Rolling Right;Rolling Left;Sit to Supine;Supine to Sit Rolling Right: Independent with assistive device Rolling Left: Independent with assistive device Supine to Sit: Minimal Assistance - Patient > 75%;Contact Guard/Touching assist Sit to Supine: Contact Guard/Touching assist Transfers Transfers: Sit to Stand;Stand to Sit;Stand Pivot Transfers Sit to Stand: Contact Guard/Touching assist;Minimal Assistance -  Patient > 75% Stand to Sit: Contact Guard/Touching assist;Minimal Assistance - Patient > 75% Stand Pivot Transfers: Contact Guard/Touching assist;Minimal Assistance - Patient > 75% Stand Pivot Transfer Details: Verbal cues for technique;Tactile cues for posture;Verbal cues for precautions/safety Transfer (Assistive device): Rolling walker Locomotion  Gait Ambulation: Yes Gait Assistance: Contact Guard/Touching assist Gait Distance (Feet): 183 Feet Assistive device: Rolling walker Gait Gait: Yes Gait Pattern: Impaired Gait Pattern: Step-through pattern (heavy pressure into BUE on RW) Gait velocity: decreased Stairs / Additional Locomotion Stairs: Yes Stairs Assistance: Contact Guard/Touching assist;Minimal Assistance - Patient > 75% Stair Management Technique: Two rails;Step to pattern Number of Stairs: 4 Height of Stairs: 6 Wheelchair Mobility Wheelchair Mobility: No  Skilled Intervention: PT Evaluation completed; see above for results. PT educated patient in roles of PT vs OT, PT POC, rehab potential, rehab goals, and discharge recommendations along with recommendation for follow-up rehabilitation services. Individual treatment initiated:  Patient supine in bed upon PT arrival. Patient alert and agreeable to PT session.   No pain complaint at start of session.  LPN present for morning medications and pt self-limits to 5 pills per medication rounding. MD present for morning rounding and pt with questions re: medications as well as upcoming surgery for gall bladder that is now postponed following angioplasty 2/2 NSTEMI.  Appropriately fitting w/c and adequate height RW acquired for pt use during stay.    Therapeutic Activity: Bed Mobility: Patient performed supine > sit with difficulty pushing up from bed surface to upright seated position. Requires increased effort and despite request for no physical assistance, pt does require MinA to complete. At end of session, pt requires  CGA/ MinA to return BLE to bed surface.  Provided vc/ tc for effort and technique. Transfers: Patient performed sit <> stand transfers throughout session with slight elevation to bed surface and self-chooses to place BUE on RW to press up into standing d/t BLE weakness. Also demos need to extend Bil knees into side of bed to assist with rise to stance. Provided vc/ tc for foot placement and need for forward weight shift over feet.   Gait Training:  Patient ambulated 125 ft using RW with CGA. Demonstrated heavy BUE pressure into RW throughout. Self  limits distance d/t decreased cardiorespiratory endurance, decreased activity tolerance, and generalized weakness. Provided vc/tc for upright posture. Maintains slow but consistent pace throughout.  Patient supine in bed  at end of session with brakes locked, bed alarm set, and all needs within reach.  Discharge Criteria: Patient will be discharged from PT if patient refuses treatment 3 consecutive times without medical reason, if treatment goals not met, if there is a change in medical status, if patient makes no progress towards goals or if patient is discharged from hospital.  The above assessment, treatment plan, treatment alternatives and goals were discussed and mutually agreed upon: by patient  Donne Gage PT, DPT, CSRS 08/15/2023, 6:49 PM

## 2023-08-16 NOTE — IPOC Note (Signed)
 Overall Plan of Care [facility]) Patient Details Name: John [name] MRN: [id number] DOB: [birth year]  Admitting Diagnosis: Debility  Hospital Problems: Principal Problem:   Debility Active Problems:   BPH (benign prostatic hyperplasia)   Foley catheter in place   Cholelithiasis   Sacral pressure ulcer   DVT, femoral, chronic (HCC)   Bladder outlet obstruction   Sepsis due to Escherichia coli [facility])     Functional Problem List: Nursing Bladder, Endurance, Safety, Skin Integrity  PT Balance, Behavior, Endurance, Motor, Nutrition, Pain, Safety, Sensory  OT Balance, Endurance, Motor, Pain, Safety, Cognition, Behavior  SLP    TR         Basic ADL's: OT Grooming, Bathing, Dressing, Toileting     Advanced  ADL's: OT       Transfers: PT Bed Mobility, Bed to Chair, Furniture, Banker, Technical brewer: PT Ambulation, Stairs     Additional Impairments: OT    SLP        TR      Anticipated Outcomes Item Anticipated Outcome  Self Feeding Independent  Swallowing      Basic self-care  Supervision  Toileting  Supervision   Bathroom Transfers Supervision  Bowel/Bladder  regular routine of emptying bowel, regular bladder emptying  Transfers  Mod I  Locomotion  Mod I/ supervision  Communication     Cognition     Pain  no pain  Safety/Judgment  remain free of falls, infection and skin breakdown   Therapy Plan: PT Intensity: Minimum of 1-2 x/day ,45 to 90 minutes PT Frequency: 5 out of 7 days PT Duration Estimated Length of Stay: 6-9 days OT Intensity: Minimum of 1-2 x/day, 45 to 90 minutes OT Frequency: 5 out of 7 days OT Duration/Estimated Length of Stay: 7-9 days     Team Interventions: Nursing Interventions Patient/Family Education, Bladder Management, Pain Management, Skin Care/Wound Management, Psychosocial Support  PT interventions Ambulation/gait training, Cognitive remediation/compensation, Discharge planning, DME/adaptive  equipment instruction, Functional mobility training, Pain management, Psychosocial support, Splinting/orthotics, Therapeutic Activities, UE/LE Strength taining/ROM, UE/LE Coordination activities, Therapeutic Exercise, Stair training, Skin care/wound management, Patient/family education, Neuromuscular re-education, Disease management/prevention, Community reintegration, Warden/ranger  OT Interventions Warden/ranger, Discharge planning, Pain management, Self Care/advanced ADL retraining, Therapeutic Activities, UE/LE Coordination activities, Cognitive remediation/compensation, Disease mangement/prevention, Functional mobility training, Patient/family education, Skin care/wound managment, Therapeutic Exercise, Visual/perceptual remediation/compensation, Firefighter, Fish farm manager, Neuromuscular re-education, Psychosocial support, Splinting/orthotics, UE/LE Strength taining/ROM, Wheelchair propulsion/positioning  SLP Interventions    TR Interventions    SW/CM Interventions Discharge Planning, Psychosocial Support, Patient/Family Education   Barriers to Discharge MD  Medical stability  Nursing Neurogenic Bowel & Bladder, Incontinence foley leakage ongoing requiring flushes 2 level 3 ste mail living on main with bil rails w spouse  PT (!) Inaccessible home environment, Decreased caregiver support, Incontinence, Insurance for SNF coverage, Pending surgery    OT      SLP      SW       Team Discharge Planning: Destination: PT-Home ,OT- Home , SLP-  Projected Follow-up: PT-Home health PT, 24 hour supervision/assistance, OT-  Home health OT, SLP-  Projected Equipment Needs: PT-To be determined, OT- To be determined, SLP-  Equipment Details: PT- , OT-  Patient/family involved in discharge planning: PT- Patient,  OT-Patient, SLP-   MD ELOS: 7-8 days Medical Rehab Prognosis:  Excellent Assessment: The patient has been admitted for CIR  therapies with the diagnosis of debility 2/2 urosepsis/NSTEMI. The team will be  addressing functional mobility, strength, stamina, balance, safety, adaptive techniques and equipment, self-care, bowel and bladder mgt, patient and caregiver education. Goals have been set at S. Anticipated discharge destination is home.        See Team Conference Notes for weekly updates to the plan of care

## 2023-08-16 NOTE — Progress Notes (Signed)
 Pt refused his PO antibiotics this AM. Stated that they make his stomach hurt and he was "tired of my stomaching hurting." Pt educated on the importance of antibiotics. Care ongoing.    Takiya Belmares  Verline Glow, LPN

## 2023-08-17 ENCOUNTER — Inpatient Hospital Stay (HOSPITAL_COMMUNITY)

## 2023-08-17 ENCOUNTER — Ambulatory Visit: Payer: Medicare Other | Admitting: Cardiology

## 2023-08-17 ENCOUNTER — Other Ambulatory Visit (HOSPITAL_COMMUNITY): Payer: Self-pay

## 2023-08-17 DIAGNOSIS — I214 Non-ST elevation (NSTEMI) myocardial infarction: Secondary | ICD-10-CM | POA: Diagnosis not present

## 2023-08-17 DIAGNOSIS — I5020 Unspecified systolic (congestive) heart failure: Secondary | ICD-10-CM

## 2023-08-17 DIAGNOSIS — I255 Ischemic cardiomyopathy: Secondary | ICD-10-CM

## 2023-08-17 DIAGNOSIS — R5381 Other malaise: Secondary | ICD-10-CM | POA: Diagnosis not present

## 2023-08-17 DIAGNOSIS — I951 Orthostatic hypotension: Secondary | ICD-10-CM

## 2023-08-17 DIAGNOSIS — Z950 Presence of cardiac pacemaker: Secondary | ICD-10-CM

## 2023-08-17 LAB — BASIC METABOLIC PANEL WITH GFR
Anion gap: 10 (ref 5–15)
BUN: 6 mg/dL — ABNORMAL LOW (ref 8–23)
CO2: 22 mmol/L (ref 22–32)
Calcium: 9.3 mg/dL (ref 8.9–10.3)
Chloride: 104 mmol/L (ref 98–111)
Creatinine, Ser: 0.93 mg/dL (ref 0.61–1.24)
GFR, Estimated: >60 mL/min (ref >60)
Glucose, Bld: 324 mg/dL — ABNORMAL HIGH (ref 70–99)
Potassium: 3.9 mmol/L (ref 3.5–5.1)
Sodium: 136 mmol/L (ref 135–145)

## 2023-08-17 LAB — GLUCOSE, CAPILLARY
Glucose-Capillary: 177 mg/dL — ABNORMAL HIGH (ref 70–99)
Glucose-Capillary: 313 mg/dL — ABNORMAL HIGH (ref 70–99)
Glucose-Capillary: 199 mg/dL — ABNORMAL HIGH (ref 70–99)
Glucose-Capillary: 160 mg/dL — ABNORMAL HIGH (ref 70–99)

## 2023-08-17 LAB — CBC
HCT: 35 % — ABNORMAL LOW (ref 39.0–52.0)
Hemoglobin: 11.1 g/dL — ABNORMAL LOW (ref 13.0–17.0)
MCH: 27.7 pg (ref 26.0–34.0)
MCHC: 31.7 g/dL (ref 30.0–36.0)
MCV: 87.3 fL (ref 80.0–100.0)
Platelets: 379 10*3/uL (ref 150–400)
RBC: 4.01 MIL/uL — ABNORMAL LOW (ref 4.22–5.81)
RDW: 18.3 % — ABNORMAL HIGH (ref 11.5–15.5)
WBC: 10 10*3/uL (ref 4.0–10.5)
nRBC: 0 % (ref 0.0–0.2)

## 2023-08-17 MED ORDER — AMOXICILLIN-POT CLAVULANATE 875-125 MG PO TABS
1.0000 | ORAL_TABLET | Freq: Two times a day (BID) | ORAL | 0 refills | Status: DC
  Filled 2023-08-17: qty 6, 3d supply, fill #0

## 2023-08-17 MED ORDER — FERROUS SULFATE 325 (65 FE) MG PO TABS
325.0000 mg | ORAL_TABLET | Freq: Every day | ORAL | 0 refills | Status: DC
  Filled 2023-08-17: qty 30, 30d supply, fill #0

## 2023-08-17 MED ORDER — APIXABAN 5 MG PO TABS
5.0000 mg | ORAL_TABLET | Freq: Two times a day (BID) | ORAL | 0 refills | Status: DC
Start: 2023-08-17 — End: 2023-08-29
  Filled 2023-08-17: qty 60, 30d supply, fill #0

## 2023-08-17 MED ORDER — METFORMIN HCL 500 MG PO TABS
500.0000 mg | ORAL_TABLET | Freq: Two times a day (BID) | ORAL | Status: DC
  Administered 2023-08-17 – 2023-08-19 (×4): 500 mg via ORAL
  Filled 2023-08-17 (×4): qty 1

## 2023-08-17 MED ORDER — METFORMIN HCL 500 MG PO TABS
500.0000 mg | ORAL_TABLET | Freq: Two times a day (BID) | ORAL | 0 refills | Status: DC
Start: 2023-08-17 — End: 2023-08-29
  Filled 2023-08-17: qty 60, 30d supply, fill #0

## 2023-08-17 MED ORDER — AMOXICILLIN-POT CLAVULANATE 875-125 MG PO TABS
1.0000 | ORAL_TABLET | Freq: Two times a day (BID) | ORAL | 0 refills | Status: DC
  Filled 2023-08-17: qty 8, 4d supply, fill #0

## 2023-08-17 MED ORDER — BUPROPION HCL 75 MG PO TABS
75.0000 mg | ORAL_TABLET | Freq: Two times a day (BID) | ORAL | 0 refills | Status: DC
  Filled 2023-08-17 (×2): qty 60, 30d supply, fill #0

## 2023-08-17 MED ORDER — ENSURE ENLIVE PO LIQD
237.0000 mL | Freq: Two times a day (BID) | ORAL | Status: DC
  Administered 2023-08-18 – 2023-08-19 (×3): 237 mL via ORAL

## 2023-08-17 MED ORDER — HYOSCYAMINE SULFATE 0.125 MG SL SUBL
0.1250 mg | SUBLINGUAL_TABLET | SUBLINGUAL | 0 refills | Status: DC | PRN
  Filled 2023-08-17: qty 20, 4d supply, fill #0

## 2023-08-17 MED ORDER — SPIRONOLACTONE 25 MG PO TABS
12.5000 mg | ORAL_TABLET | Freq: Every day | ORAL | 0 refills | Status: DC
  Filled 2023-08-17: qty 30, 60d supply, fill #0

## 2023-08-17 MED ORDER — CYANOCOBALAMIN 1000 MCG PO TABS
1000.0000 ug | ORAL_TABLET | Freq: Every day | ORAL | 0 refills | Status: DC
  Filled 2023-08-17: qty 30, 30d supply, fill #0

## 2023-08-17 MED ORDER — ATORVASTATIN CALCIUM 80 MG PO TABS
80.0000 mg | ORAL_TABLET | Freq: Every day | ORAL | 0 refills | Status: DC
  Filled 2023-08-17: qty 30, 30d supply, fill #0

## 2023-08-17 MED ORDER — METFORMIN HCL 500 MG PO TABS
500.0000 mg | ORAL_TABLET | Freq: Every day | ORAL | 0 refills | Status: DC
  Filled 2023-08-17: qty 30, 30d supply, fill #0

## 2023-08-17 MED ORDER — TAMSULOSIN HCL 0.4 MG PO CAPS
0.4000 mg | ORAL_CAPSULE | Freq: Every day | ORAL | 0 refills | Status: DC
  Filled 2023-08-17: qty 30, 30d supply, fill #0

## 2023-08-17 MED ORDER — FINASTERIDE 5 MG PO TABS
5.0000 mg | ORAL_TABLET | Freq: Every day | ORAL | 0 refills | Status: DC
  Filled 2023-08-17: qty 30, 30d supply, fill #0

## 2023-08-17 MED ORDER — CLOPIDOGREL BISULFATE 75 MG PO TABS
75.0000 mg | ORAL_TABLET | Freq: Every day | ORAL | 0 refills | Status: DC
  Filled 2023-08-17: qty 30, 30d supply, fill #0

## 2023-08-17 MED ORDER — SODIUM CHLORIDE 0.9 % IV BOLUS
250.0000 mL | Freq: Once | INTRAVENOUS | Status: AC
  Administered 2023-08-17: 250 mL via INTRAVENOUS

## 2023-08-17 MED ORDER — CARVEDILOL 3.125 MG PO TABS
3.1250 mg | ORAL_TABLET | Freq: Two times a day (BID) | ORAL | 0 refills | Status: DC
  Filled 2023-08-17: qty 60, 30d supply, fill #0

## 2023-08-17 MED ORDER — PANTOPRAZOLE SODIUM 40 MG PO TBEC
40.0000 mg | DELAYED_RELEASE_TABLET | Freq: Every day | ORAL | 0 refills | Status: DC
Start: 2023-08-17 — End: 2023-11-14
  Filled 2023-08-17 – 2023-09-13 (×2): qty 30, 30d supply, fill #0

## 2023-08-17 NOTE — Plan of Care (Signed)
  Problem: Consults Goal: RH GENERAL PATIENT EDUCATION Description: See Patient Education module for education specifics. Outcome: Progressing   Problem: RH BLADDER ELIMINATION Goal: RH STG MANAGE BLADDER WITH ASSISTANCE Description: STG Manage Bladder With mod I Assistance Outcome: Progressing   Problem: RH SKIN INTEGRITY Goal: RH STG SKIN FREE OF INFECTION/BREAKDOWN Description: Patient and wife will be able to manage skin using educational resources independently Outcome: Progressing   Problem: RH KNOWLEDGE DEFICIT GENERAL Goal: RH STG INCREASE KNOWLEDGE OF SELF CARE AFTER HOSPITALIZATION Description: Patient and spouse will be able to manage care at discharge using educational resources independently Outcome: Progressing

## 2023-08-17 NOTE — Progress Notes (Signed)
 Occupational Therapy Discharge Summary  Patient Details  Name: John [name] MRN: [id number] Date of Birth: 01-Aug-1945  Date of Discharge from OT service:Aug 19, 2023   Patient has met 8 of 9 long term goals due to improved activity tolerance, improved balance, postural control, ability to compensate for deficits, improved attention, and improved awareness.  Patient to discharge at overall Supervision level.  Patient's care partner is independent to provide the necessary physical and cognitive assistance at discharge.    Reasons goals not met:  Pt requires min A for LB dressing d/t LB strength deficits and for foley management.   Recommendation:  Patient will benefit from ongoing skilled OT services in home health setting to continue to advance functional skills in the area of BADL and Reduce care partner burden.  Equipment: No equipment provided  Reasons for discharge: treatment goals met and discharge from hospital  Patient/family agrees with progress made and goals achieved: Yes  OT Discharge ADL ADL Eating: Set up, Independent Where Assessed-Eating: Chair Grooming: Modified independent Where Assessed-Grooming: Sitting at sink Upper Body Bathing: Supervision/safety Where Assessed-Upper Body Bathing: Edge of bed Lower Body Bathing: Supervision/safety Where Assessed-Lower Body Bathing: Edge of bed Upper Body Dressing: Independent Where Assessed-Upper Body Dressing: Edge of bed Lower Body Dressing: Minimal assistance Where Assessed-Lower Body Dressing: Edge of bed (d/t foley mgmt) Toileting: Supervision/safety Where Assessed-Toileting: Teacher, adult education: Close supervision Toilet Transfer Method: Ambulating (RW) Acupuncturist: Grab bars, Gaffer: Not assessed Film/video editor: Close supervision Film/video editor Method: Designer, industrial/product: Grab bars, Sales promotion account executive Baseline  Vision/History: 1 Wears glasses Patient Visual Report: No change from baseline Vision Assessment?: No apparent visual deficits Perception  Perception: Within Functional Limits Praxis Praxis: WFL Cognition Brief Interview for Mental Status (BIMS) Repetition of Three Words (First Attempt): 3 Temporal Orientation: Year: Correct Temporal Orientation: Month: Accurate within 5 days Temporal Orientation: Day: Correct Recall: "Sock": Yes, no cue required Recall: "Blue": Yes, no cue required Recall: "Bed": Yes, no cue required BIMS Summary Score: 15 Sensation Sensation Light Touch: Appears Intact Hot/Cold: Appears Intact Proprioception: Appears Intact Additional Comments: 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 Coordination Gross Motor Movements are Fluid and Coordinated: No Fine Motor Movements are Fluid and Coordinated: No Motor  Motor Motor: Other (comment) Motor - Discharge Observations: pt continues to present with weakness but slightly improved from eval, difficulty improving motor functioning 2/2 self limiting behaviors Mobility  Bed Mobility Supine to Sit: Contact Guard/Touching assist Sit to Supine: Contact Guard/Touching assist Transfers Sit to Stand: Independent with assistive device Stand to Sit: Independent with assistive device  Trunk/Postural Assessment  Cervical Assessment Cervical Assessment: Exceptions to [facility] (forward head) Thoracic Assessment Thoracic Assessment: Exceptions to [facility] (rounded shoulders with mild kyphosis) Lumbar Assessment Lumbar Assessment: Exceptions to [facility] (posterior pelvic tilt) Postural Control Postural Control: Deficits on evaluation  Balance Static Sitting Balance Static Sitting - Balance Support: Feet supported Static Sitting - Level of Assistance: 7: Independent Dynamic Sitting Balance Dynamic Sitting - Balance Support: Feet supported Dynamic Sitting - Level of Assistance: 6: Modified independent (Device/Increase time) Static Standing  Balance Static Standing - Balance Support: During functional activity;Bilateral upper extremity supported Static Standing - Level of Assistance: 6: Modified independent (Device/Increase time) Dynamic Standing Balance Dynamic Standing - Balance Support: Right upper extremity supported Dynamic Standing - Level of Assistance: 5: Stand by assistance (supervision) Extremity/Trunk Assessment RUE Assessment RUE Assessment: Exceptions to [facility] General Strength Comments: Grip 52 lbs, lateral pinch 14.5;  4/5 overall d/t general deconditioning LUE Assessment LUE Assessment: Exceptions to [facility] Passive Range of Motion (PROM) Comments: WFL Active Range of Motion (AROM) Comments: WFL General Strength Comments: Grip 44 lbs, lateral pinch 11.3 lbs; 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

## 2023-08-17 NOTE — Progress Notes (Signed)
 Occupational Therapy Session Note  Patient Details  Name: John [name] MRN: [id number] Date of Birth: 1945/09/04  Today's Date: 08/17/2023 OT Individual Time: 0738-0819+1304-1334 OT Individual Time Calculation (min): 41 min    Short Term Goals: Week 1:  OT Short Term Goal 1 (Week 1): STG=LTG d/t ELOS  Skilled Therapeutic Interventions/Progress Updates:  Session 1: Pt greeted seated EOB eating breakfast, pt agreeable to OT intervention.      Transfers/bed mobility/functional mobility:  Pt completed sit>stand from heavily elevated EOB with CGA as pt reports he uses a lift chair at home. Pt completed stand pivot to w/c with RW and  CGA.    IADLS: Attempted to work on dynamic reaching in kitchen with pt instructed to reach into cabinets and in refrigerator to retrieve IADL items, pt with episode of dizziness needing to sit immediately.  BP 82/55( 65) HR 89   Education provided on how this OTA doesn't feel pt is safe to DC home d/t OH. Pt reports " I was dizzy before this." Pt still insistent he wants to DC home today/tomorrow. Reviewed DME needs and indicated in sticky note.                 Ended session with pt supine in bed with all needs within reach and bed alarm activated.                   Session 2: Pt greeted supine in bed, pt agreeable to OT intervention.    Noted earlier suspected OH from PT session, therefore donned teds and assessed vitals as indicated below however pt unable to tolerate sitting up long enough to obtain true reading d/t c/o pain in rectum from needing to have a BM.   Supine with no teds 139/67  Sitting with teds- 112/64  Transfers/bed mobility/functional mobility:  Pt completed supine>sit with CGA. Pt completed sit>stands from elevated EOB with MINA. CGA for stand pivot transfers with Rw.     ADLs:   LB dressing: donned new brief from toilet with total A d/t foley mgmt/ time mgmt.   Transfers: stand pivot transfer from w/c<>BSC over toilet with Rw  and CGA.  Toileting: incontinent bowel void with pt able to complete pericare with CGA but did provide MAX A for cleanliness.    Pt handed off directly to NT to assist with transfer back to w/c.                    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

## 2023-08-17 NOTE — Progress Notes (Signed)
 Occupational Therapy Session Note  Patient Details  Name: Tieran Vanboxtel MRN: [id number] Date of Birth: 05-31-1945  Today's Date: 08/17/2023 OT Missed Time: 75 Minutes Missed Time Reason: Patient fatigue;Patient unwilling/refused to participate without medical reason   Short Term Goals: Week 1:  OT Short Term Goal 1 (Week 1): STG=LTG d/t ELOS  Skilled Therapeutic Interventions/Progress Updates:     Attempted to see Pt for PM OT session. Pt received in bed sleeping waking upon OT arrival. Pt reporting that it has been a tough day and declining all therapeutic activities including bed level exercises, ADLs, etc. Provided maximal therapeutic support and encouragement, however Pt continued to decline. Missed 75 minutes of skilled OT treatment, will attempt to make up time as schedule and Pt's status allow.   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

## 2023-08-17 NOTE — Progress Notes (Signed)
 Physical Therapy Session Note  Patient Details  Name: John [name] MRN: [id number] Date of Birth: September 25, 1945  Today's Date: 08/17/2023 PT Individual Time: 1133-1159 PT Individual Time Calculation (min): 26 min   Short Term Goals: Week 1:  PT Short Term Goal 1 (Week 1): STG = LTG d/t ELOS  Skilled Therapeutic Interventions/Progress Updates:      Pt presents in bed. Agreeable to therapy treatment but reports L hip pain and fatigue. Pt reports L hip pain began after he fell at home, reports it hurts to lay on his L side and to move his L leg. MD notified with plan for hip XR. Pt reports getting dizzy and lightheaded earlier with OT.   Supine<>Sitting EOB with supervision. BP checked sitting vs standing. Sitting:132/69 Standing: 74/53 *pt symptomatic.   Supervision for sti<>stand to RW and CGA for standing balance due to symptoms. Pt requesting to return to bed so this was done. He prefer's to lie on his R side to prevent weight on his L hip. Patient made comfortable. Discussed barriers to DC with orthostasis and concern for L hip pain. Pt left with needs met at end of session.      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

## 2023-08-17 NOTE — Patient Care Conference (Signed)
 Inpatient RehabilitationTeam Conference and Plan of Care Update Date: 08/17/2023   Time: 1:38 PM    Patient Name: John [name]      Medical Record Number: [id number]  Date of Birth: Nov 25, 1945 Sex: Male         Room/Bed: 4W20C/4W20C-01 Payor Info: Payor: [employer] MEDICARE / Plan: BCBS MEDICARE / Product Type: *No Product type* /    Admit Date/Time:  08/13/2023  5:54 PM  Primary Diagnosis:  Debility  Hospital Problems: Principal Problem:   Debility Active Problems:   BPH (benign prostatic hyperplasia)   Foley catheter in place   Cholelithiasis   Sacral pressure ulcer   DVT, femoral, chronic (HCC)   Bladder outlet obstruction   Sepsis due to Escherichia coli [facility])    Expected Discharge Date: Expected Discharge Date: 08/19/23  Team Members Present: Physician leading conference: Dr. Laverle Postin Social Worker Present: Adrianna Albee, LCSW Nurse Present: Forrestine Ike, RN PT Present: Oma Bias, PT OT Present: Henrene Locust, OT SLP Present: Other (comment) Ronelle Coffee, SLP) PPS Coordinator present : Jestine Moron, SLP     Current Status/Progress Goal Weekly Team Focus  Bowel/Bladder   Foley in place with order to not remove. LBM document was on 4/29, pt refused bowel medications.   Will have BM        Swallow/Nutrition/ Hydration               ADL's   supervision overall for BADLs using RW- requires verbal cues for safety   supervision overall for ADLs, mod I sit<>stand and grooming/hygine   activity tolerance, Pt education, d/c planning, BADL retraining, dynamic balance, safety awareness education    Mobility   mod I bed mobility, supervision transfers and gait using RW, supervision stairs with 2 hand rails   mod I  standing balance, DC planning, BLE strengthening    Communication                Safety/Cognition/ Behavioral Observations               Pain   no c/o pain   Pain <3/10   Assess qShift and prn    Skin    MASD to buttocks.   Free from further skin breakdown  Assess Qshift and prn      Discharge Planning:  Home with wife who is able to assist and was prior to admission. Pt would like to go home ASAP   Team Discussion: Patient admitted with debility post UTI; limited by hip pain and symptomatic orthostasis with balance issues, requiring cues for safety.  Patient on target to meet rehab goals: yes, currently needs supervision for ADLS using a RW.  Needs supervision to manage steps and maintain gait; close to baseline overall.    *See Care Plan and progress notes for long and short-term goals.   Revisions to Treatment Plan:  IV fluid bolus Lactobacillus for GI upset with abx X-ray hip  Teaching Needs: Safety, medications, foley management, transfers, toileting, etc.   Current Barriers to Discharge: Decreased caregiver support and Home enviroment access/layout  Possible Resolutions to Barriers: Family education HH follow up services     Medical Summary Current Status: overweight, constipation, type 2 diabetes, urianary retention, E coli bacteremia and urosepsis  Barriers to Discharge: Medical stability  Barriers to Discharge Comments: overweight, constipation, type 2 diabetes, urianary retention, E coli bacteremia and urosepsis Possible Resolutions to [employer]: provided dietary education, sorbitol  and suppository administered today, educated regarding importance  of antibiotics, consulted urology   Continued Need for Acute Rehabilitation Level of Care: The patient requires daily medical management by a physician with specialized training in physical medicine and rehabilitation for the following reasons: Direction of a multidisciplinary physical rehabilitation program to maximize functional independence : Yes Medical management of patient stability for increased activity during participation in an intensive rehabilitation regime.: Yes Analysis of laboratory values and/or  radiology reports with any subsequent need for medication adjustment and/or medical intervention. : Yes   I attest that I was present, lead the team conference, and concur with the assessment and plan of the team.   Forrestine Ike B 08/17/2023, 1:38 PM

## 2023-08-17 NOTE — Progress Notes (Signed)
 Physical Therapy Discharge Summary  Patient Details  Name: John [name] MRN: [id number] Date of Birth: [birth year]  Date of Discharge from PT service:Aug 19, 2023  Today's Date: 08/19/2023 PT Individual Time: 1159-1215 PT Individual Time Calculation (min): 16 min    Patient has met {NUMBERS [id number]} of 9 long term goals due to improved activity tolerance, improved balance, improved postural control, increased strength, decreased pain, and ability to compensate for deficits.  Patient to discharge at an ambulatory level Supervision.   Patient's care partner is independent to provide the necessary physical and cognitive assistance at discharge.  Reasons goals not met: ***  Recommendation:  Patient will benefit from ongoing skilled PT services in home health setting to continue to advance safe functional mobility, address ongoing impairments in transfers, bed mobility, standing balance, home safety, caregiver training, and minimize fall risk.  Equipment: No equipment provided - pt owns all needed DME  Reasons for discharge: treatment goals met and discharge from hospital  Patient/family agrees with progress made and goals achieved: Yes  PT Discharge Precautions/Restrictions Precautions Precautions: Fall Precaution/Restrictions Comments: monitor BP Restrictions Weight Bearing Restrictions Per Provider Order: No Pain Interference Pain Interference Pain Effect on Sleep: 2. Occasionally Pain Interference with Therapy Activities: 2. Occasionally Pain Interference with Day-to-Day Activities: 2. 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 Motor  Motor Motor: Hemiplegia;Abnormal postural alignment and control Motor - Discharge Observations: pt continues to present with weakness but slightly improved from eval, difficulty improving motor functioning 2/2 self limiting behaviors  Mobility Bed Mobility Bed Mobility: Rolling Right;Rolling Left;Sit to Supine;Supine to Sit Rolling Right: Independent with assistive device Rolling Left: Independent with assistive device Supine to Sit: Independent with assistive device Sit to Supine: Independent with assistive device Transfers Transfers: Sit to Stand;Stand to Sit;Stand Pivot Transfers Sit to Stand: Independent with assistive device Stand to Sit: Independent with assistive device Stand Pivot Transfers: Independent with assistive device Transfer (Assistive device): Rolling walker Locomotion  Gait Ambulation: Yes Gait Assistance: Supervision/Verbal cueing Gait Distance (Feet): 150 Feet Assistive device: Rolling walker Gait Assistance Details: Verbal cues for gait pattern;Verbal cues for precautions/safety;Verbal cues for technique;Verbal cues for safe use of DME/AE Gait Gait: Yes Gait Pattern: Impaired Gait Pattern: Trunk flexed;Decreased stride length Stairs / Additional Locomotion Stairs: Yes Stairs Assistance: Contact Guard/Touching assist Stair Management Technique: Two rails;Step to pattern Number of Stairs: 12 Height of Stairs: 6 Wheelchair Mobility Wheelchair Mobility: No  Trunk/Postural Assessment  Cervical Assessment Cervical Assessment: Exceptions to [facility] (rests head in forward head position, flexed) Thoracic Assessment Thoracic Assessment: Exceptions to [facility] (rounded shoulders) Lumbar Assessment Lumbar Assessment:  Exceptions to [facility] (post tilt) Postural Control Postural Control: Deficits on evaluation Trunk Control: posterior bias during transfers, relies on Bil knee extension into seat to steady self  Balance Balance Balance Assessed: Yes Static Sitting Balance Static Sitting - Balance Support: Feet supported Static Sitting - Level of Assistance: 7: Independent Dynamic Sitting Balance Dynamic Sitting - Balance Support: Feet supported Dynamic Sitting - Level of Assistance: 6: Modified independent (Device/Increase time) Static Standing Balance Static Standing - Balance  Support: During functional activity;Bilateral upper extremity supported Static Standing - Level of Assistance: 6: Modified independent (Device/Increase time) Dynamic Standing Balance Dynamic Standing - Balance Support: Bilateral upper extremity supported;During functional activity Dynamic Standing - Level of Assistance: 5: Stand by assistance Extremity Assessment      RLE Assessment RLE Assessment: Exceptions to [facility] General Strength Comments: grossly 4- to 4/ 5 LLE Assessment LLE Assessment: Exceptions to [facility] General Strength Comments: grossly 3+ to 4-/ 5  Treatment: Pt in bed with wife present. Wife requesting to speak with therapy prior to DC to answer questions. Wife reports feeling overwhelmed re: caregiver burden and level of care at home. Wife reports she has kids in [place] but they're unable to provide any support. Therapeutic listening and provided space for open discussion b/w pt and spouse. Discussed PT goals, PT POC, DME, RW at all times when mobilizing, follow up therapy rec's, BP control with TEDS (rec zip-up compression socks which she ordered on [employer]). Nursing in room packing items up for DC. Left with all needs met.   Won Kreuzer P Bricia Taher PT 08/19/2023, 12:49 PM

## 2023-08-17 NOTE — Progress Notes (Addendum)
 Rounding Note    Patient Name: John [name] Date of Encounter: 08/17/2023  [facility] Cardiologist: [name], MD   Subjective   Pt is very upset that he was orthostatic today, he was looking forward to going home.   Inpatient Medications    Scheduled Meds:  amoxicillin -clavulanate  1 tablet Oral Q12H   apixaban   5 mg Oral BID   atorvastatin   80 mg Oral QHS   Chlorhexidine  Gluconate Cloth  6 each Topical Daily   clopidogrel   75 mg Oral Q breakfast   vitamin B-12  1,000 mcg Oral Daily   finasteride   5 mg Oral Daily   Gerhardt's butt cream   Topical BID   insulin  aspart  0-9 Units Subcutaneous TID AC & HS   metFORMIN   500 mg Oral BID WC   nystatin    Topical TID   pantoprazole   40 mg Oral QHS   senna-docusate  2 tablet Oral QHS   spironolactone   12.5 mg Oral Daily   tamsulosin   0.4 mg Oral QPC supper   Continuous Infusions:  PRN Meds: acetaminophen  **OR** acetaminophen , bacitracin , benzonatate , bisacodyl, hyoscyamine , ondansetron  **OR** ondansetron  (ZOFRAN ) IV, polyethylene glycol, sorbitol , traZODone    Vital Signs    Vitals:   08/16/23 2019 08/17/23 0610 08/17/23 0610 08/17/23 1315  BP: 138/76 122/63 122/63 139/67  Pulse: 73 73 73 78  Resp: 18 18 18 18   Temp: 98.2 F (36.8 C) 98 F (36.7 C) 98 F (36.7 C) 97.6 F (36.4 C)  TempSrc: Oral Oral  Oral  SpO2: 98% 93% 93% 100%  Weight:      Height:        Intake/Output Summary (Last 24 hours) at 08/17/2023 1408 Last data filed at 08/17/2023 0835 Gross per 24 hour  Intake 120 ml  Output 1900 ml  Net -1780 ml      08/13/2023    6:13 PM 08/08/2023    4:00 AM 08/06/2023   10:50 PM  Last 3 Weights  Weight (lbs) 197 lb 15.6 oz 202 lb 2.6 oz 198 lb 6.6 oz  Weight (kg) 89.8 kg 91.7 kg 90 kg      Telemetry    NA - Personally Reviewed  ECG    No new tracings - Personally Reviewed  Physical Exam   GEN: No acute distress.   Neck: No JVD Cardiac: RRR, no murmurs, rubs, or gallops.   Respiratory: Clear to auscultation bilaterally. GI: Soft, nontender, non-distended  MS: No edema - compression stockings in place Neuro:  Nonfocal  Psych: Normal affect   Labs    High Sensitivity Troponin:   Recent Labs  Lab 08/10/23 1159 08/10/23 1411  TROPONINIHS 767* 750*     Chemistry Recent Labs  Lab 08/11/23 0431 08/12/23 1019 08/13/23 0937 08/15/23 0540 08/17/23 1208  NA 139 139 139 138 136  K 3.1* 3.7 3.6 3.4* 3.9  CL 105 105 103 106 104  CO2 24 21* 27 27 22   GLUCOSE 160* 174* 207* 183* 324*  BUN 7* 5* 5* 6* 6*  CREATININE 0.74 0.74 0.88 0.81 0.93  CALCIUM  8.3* 8.9 9.2 8.7* 9.3  MG 2.0 1.8  --   --   --   PROT 4.9* 5.5* 5.8* 5.1*  --   ALBUMIN 2.3* 2.7* 2.8* 2.5*  --   AST 20 26 25 17   --   ALT 14 14 15 10   --   ALKPHOS 44 54 63 51  --   BILITOT 1.2 1.4* 1.4* 2.1*  --  GFRNONAA >60 >60 >60 >60 >60  ANIONGAP 10 13 9 5 10     Lipids No results for input(s): "CHOL", "TRIG", "HDL", "LABVLDL", "LDLCALC", "CHOLHDL" in the last 168 hours.  Hematology Recent Labs  Lab 08/12/23 1019 08/15/23 0540 08/17/23 1208  WBC 7.4 7.5 10.0  RBC 3.89* 3.66* 4.01*  HGB 10.8* 10.3* 11.1*  HCT 33.7* 31.8* 35.0*  MCV 86.6 86.9 87.3  MCH 27.8 28.1 27.7  MCHC 32.0 32.4 31.7  RDW 18.5* 18.6* 18.3*  PLT 276 322 379   Thyroid  No results for input(s): "TSH", "FREET4" in the last 168 hours.  BNPNo results for input(s): "BNP", "PROBNP" in the last 168 hours.  DDimer No results for input(s): "DDIMER" in the last 168 hours.   Radiology    No results found.  Cardiac Studies   Echo 08/11/23: 1. Left ventricular ejection fraction, by estimation, is 30 to 35%. The  left ventricle has moderately decreased function. The left ventricle  demonstrates regional wall motion abnormalities with mid to apical  anteroseptal and inferoseptal akinesis and  akinesis of the apical inferior/anterior/lateral walls. Akinesis of the  true apex. Findings suggest LAD territory infarction. No LV  thrombus.  There is mild concentric left ventricular hypertrophy. Left ventricular  diastolic parameters are consistent with   Grade I diastolic dysfunction (impaired relaxation).   2. Right ventricular systolic function is normal. The right ventricular  size is normal. There is mildly elevated pulmonary artery systolic  pressure. The estimated right ventricular systolic pressure is 41.9 mmHg.   3. The aortic valve is tricuspid. There is mild calcification of the  aortic valve. Aortic valve regurgitation is mild. No aortic stenosis is  present.   4. The inferior vena cava is dilated in size with >50% respiratory  variability, suggesting right atrial pressure of 8 mmHg.   5. The mitral valve is normal in structure. No evidence of mitral valve  regurgitation. No evidence of mitral stenosis.    Patient Profile     78 y.o. male with past medical history of hypertension, diabetes mellitus, history of breast cancer, recent CVA, status post recent pacemaker for complete heart block admitted with confusion, sepsis and cholecystitis for evaluation prior to surgery.  Patient had an episode of hypotension on the floor with systolic in the 60s associated with anterior T wave changes.  Cardiac catheterization performed April 30 showed 95% proximal RCA, 80% PDA, 95% mid LAD, 80% first diagonal.  The culprit lesion was felt to be the mid LAD and patient had PCI with drug-eluting stent.  The right coronary artery was felt to have extensive small vessel disease and was nondominant and will be treated medically.  Follow-up echocardiogram showed ejection fraction 30 to 35%, mid to apical anteroseptal and inferoseptal akinesis, grade 1 diastolic dysfunction, mild aortic insufficiency.   Assessment & Plan    Orthostatic hypotension - 132/69 sitting --> 74/53 standing earlier today with PT - coreg  was discontinued, BP appears within good range - he remains orthostatic after coreg  was discontinued - remains on  12.5 mg spironolactone  - for GDMT, can trial toprol, but he will likely need to start midodrine 5 mg TID with drop in BP - will stop IVF, BP improved: 139/67 lying and 112/64 standing  - he is wearing compression stockings, but also recommend thigh high compression and abdominal binder - question dehydration today, ate 2 bites of lunch and about 4 oz of water    NSTEMI - PCI/DES to LAD - continue plavix , no ASA  given need for eliquis  - no chest pain   Ischemic cardiomyopathy LVEF 30-35% with WMA - GDMT has been difficult given BP, orthostasis, and anaphylaxis to ACEI - was started on low dose coreg  and spironolactone , but coreg  was discontinued due to hypotension with standing - he is not volume up on exam   Recent PE - eliquis  resumed   PPM in place for CHB - EP reprogrammed this hospitalization   Cholecystitis - planned for lap chole, now on hold given recent stent placement      For questions or updates, please contact [facility] Please consult [email or web address] for contact info under        Signed, Lamond Pilot, PA  08/17/2023, 2:08 PM     Attending Note:   The patient was seen and examined.  Agree with assessment and plan as noted above.  Changes made to the above note as needed.  Patient seen and independently examined with Romualdo Cobble, PA .   We discussed all aspects of the encounter. I agree with the assessment and plan as stated above.    Orthostatic hypotension:   agree with holding Coreg .   Will continue spironolactone  12.5 mg a day for now.  If he has continued episodes of orthostatic hypotension, we will need to hold the spironolactone  also .   Would consider the addition of midodrine only if he remains orthostatic off all cardiac meds   2.  NSTEMI   Cont plavix , eliquis   Doing well, no angina   3.  HFrEF :  EF 30-35% We are limited in our GDMT by his orthostatic hypotension    4.  Hx of PE: continue eliquis      I have spent a total  of 40 minutes with patient reviewing hospital  notes , telemetry, EKGs, labs and examining patient as well as establishing an assessment and plan that was discussed with the patient.  > 50% of time was spent in direct patient care.    [place], Marieta Shorten., MD, [facility] 08/17/2023, 5:08 PM [address],  [address] Office [phone] Pager [phone]

## 2023-08-17 NOTE — Progress Notes (Signed)
 PROGRESS NOTE   Subjective/Complaints: Constipated, suppository and sorbitol  ordered, asked nursing to let me know if there are no results 30 minutes after administration  ROS: +abdominal pain, +constipation   Objective:   No results found. Recent Labs    08/15/23 0540  WBC 7.5  HGB 10.3*  HCT 31.8*  PLT 322   Recent Labs    08/15/23 0540  NA 138  K 3.4*  CL 106  CO2 27  GLUCOSE 183*  BUN 6*  CREATININE 0.81  CALCIUM  8.7*    Intake/Output Summary (Last 24 hours) at 08/17/2023 1113 Last data filed at 08/17/2023 0835 Gross per 24 hour  Intake 240 ml  Output 1900 ml  Net -1660 ml     Pressure Injury 08/06/23 Sacrum Medial Stage 1 -  Intact skin with non-blanchable redness of a localized area usually over a bony prominence. (Active)  08/06/23 2250  Location: Sacrum  Location Orientation: Medial  Staging: Stage 1 -  Intact skin with non-blanchable redness of a localized area usually over a bony prominence.  Wound Description (Comments):   Present on Admission: Yes    Physical Exam: Vital Signs Blood pressure 122/63, pulse 73, temperature 98 F (36.7 C), temperature source Oral, resp. rate 18, height 6' (1.829 m), weight 89.8 kg, SpO2 93%.  General: Alert and oriented x 3, No apparent distress HEENT: Head is normocephalic, atraumatic, PERRLA, EOMI, sclera anicteric, oral mucosa pink and moist, dentition intact, ext ear canals clear,  Neck: Supple without JVD or lymphadenopathy Heart: Reg rate and rhythm. No murmurs rubs or gallops Chest: CTA bilaterally without wheezes, rales, or rhonchi; no distress Abdomen: Soft, non-tender, non-distended, bowel sounds positive. Extremities: No clubbing, cyanosis, or edema. Pulses are 2+ Psych: Pt's affect is appropriate. Pt is cooperative Skin: some chronic changes in LE's. A few scattered abrasions and bruises. Pacer upper left chest wall. Uro: foley with clear, yellow  urine Neuro:  Alert and oriented x 3. Normal insight and awareness. Intact Memory. Normal language and speech. Cranial nerve exam unremarkable. MMT: BUE 4/5 deltoids,4+ biceps, triceps, hands and wrists. BLE 4/5 HF, KE and 4+/5 ADF/PF. Sensory exam normal for light touch and pain in all 4 limbs. No limb ataxia or cerebellar signs. No abnormal tone appreciated.  Stable 5/7 Musculoskeletal: Full ROM, No pain with AROM or PROM in the neck, trunk, or extremities. Posture appropriate     Assessment/Plan: 1. Functional deficits which require 3+ hours per day of interdisciplinary therapy in a comprehensive inpatient rehab setting. Physiatrist is providing close team supervision and 24 hour management of active medical problems listed below. Physiatrist and rehab team continue to assess barriers to discharge/monitor patient progress toward functional and medical goals  Care Tool:  Bathing    Body parts bathed by patient: Right arm, Left arm, Chest, Abdomen, Front perineal area, Right upper leg, Left upper leg, Right lower leg, Left lower leg, Face, Buttocks   Body parts bathed by helper: Buttocks     Bathing assist Assist Level: Supervision/Verbal cueing     Upper Body Dressing/Undressing Upper body dressing   What is the patient wearing?: Pull over shirt    Upper body assist Assist Level:  Independent    Lower Body Dressing/Undressing Lower body dressing      What is the patient wearing?: Underwear/pull up, Incontinence brief     Lower body assist Assist for lower body dressing: Supervision/Verbal cueing     Toileting Toileting    Toileting assist Assist for toileting: Supervision/Verbal cueing     Transfers Chair/bed transfer  Transfers assist     Chair/bed transfer assist level: Supervision/Verbal cueing     Locomotion Ambulation   Ambulation assist      Assist level: Supervision/Verbal cueing Assistive device: Walker-rolling Max distance: 150'   Walk 10  feet activity   Assist     Assist level: Supervision/Verbal cueing Assistive device: Walker-rolling   Walk 50 feet activity   Assist    Assist level: Supervision/Verbal cueing Assistive device: Walker-rolling    Walk 150 feet activity   Assist    Assist level: Supervision/Verbal cueing Assistive device: Walker-rolling    Walk 10 feet on uneven surface  activity   Assist Walk 10 feet on uneven surfaces activity did not occur: Safety/medical concerns   Assist level: Supervision/Verbal cueing Assistive device: Walker-rolling   Wheelchair     Assist Is the patient using a wheelchair?: No Type of Wheelchair: Manual    Wheelchair assist level: Dependent - Patient 0% Max wheelchair distance: 200 ft    Wheelchair 50 feet with 2 turns activity    Assist        Assist Level: Dependent - Patient 0%   Wheelchair 150 feet activity     Assist      Assist Level: Dependent - Patient 0%   Blood pressure 122/63, pulse 73, temperature 98 F (36.7 C), temperature source Oral, resp. rate 18, height 6' (1.829 m), weight 89.8 kg, SpO2 93%.  Medical Problem List and Plan: 1. Functional deficits secondary to debility after urosepsis, NSTEMI             -pt is DNR-limited             -patient may  shower             -ELOS/Goals: 7-8 days, goals mod I to supervision  Continue CIR  Discussed plan for d/c tomorrow  2.  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 Pain Management: fairly well controlled at present.              -tylenol  prn  4. Mood/Behavior/Sleep: team provide ego support             -trazodone  prn for sleep             -antipsychotic agents: n/a 5. Neuropsych/cognition: This patient is capable of making decisions on his own behalf.  6. Skin/Wound Care: Sacral wound is chronic, generally healed, stage I at this point             -continue Gerhardt's cream to  buttocks/sacrum             -nutrition and appropriate pressure relief             -pt with multiple bruises and ecchymoses related to falls, blood draws, etc  7. Fluids/Electrolytes/Nutrition: pt's intake has been inconsistent             -reviewd importance of po intake with pt             -will ask RD to  follow along as well, protein supps             -5/4 will liberalize to regular diet to encourage better intake but will need to watch what he eats given gall bladder             -I personally reviewed the patient's labs today.    8. Urosepsis/ E Coli bacteremia             -pt completed ancef  on acute/last night             -5/4-begin augmentin  today per ID and continue for 8 days for a total of 2 weeks of antibiotics  5/6: consulted with ID since Augmentin  causes GI distress but they do not recommend alternative antibiotics  9. BPH with bladder outlet obstruction             -continue chronic foley, urology consulted on acute             -pt will see urology as outpt in follow up             -flush catheter as needed for obstruction or any leakage--order is in             -flomax , proscar              -hyoscyamine  for bladder spasms  Flomax  changed to HS since patient only wants to take 5 medications at 1 time  10. NSTEMI/CAD             -pt with 3 vessel disease, s/p angioplasty/stent LAD             -plavix , no asa as on eliquis  for DVT             -continue spironolactone , coreg  changed to HS only since patient only wants to take 5 medications at one time             -monitor bps, weights and with increased activity during therapy  11. Cholelithiasis, biliary diskynesia, chronic cholecystitis             -Cholecystectomy postponed d/t MI             -elective chole in 6 mos             -abx coverage x 1 week recommended (augmentin ), discussed that he will try this today after constipation is relieved  12. Hypotension: d/c Coreg , Bps reviewed and have now normalized  13.  Chronic anemia             -d/c iron supplement given constipation and since patient only wants to take 5 medications at a time  14. Constipation: suppository and sorbitol  to be administered 5/7    LOS: 4 days A FACE TO FACE EVALUATION WAS PERFORMED  Ladonne Sharples P Viktorya Arguijo 08/17/2023, 11:13 AM

## 2023-08-18 ENCOUNTER — Telehealth (HOSPITAL_COMMUNITY): Payer: Self-pay

## 2023-08-18 ENCOUNTER — Other Ambulatory Visit (HOSPITAL_COMMUNITY): Payer: Self-pay

## 2023-08-18 DIAGNOSIS — I951 Orthostatic hypotension: Secondary | ICD-10-CM | POA: Diagnosis not present

## 2023-08-18 DIAGNOSIS — I9589 Other hypotension: Secondary | ICD-10-CM

## 2023-08-18 DIAGNOSIS — R5381 Other malaise: Secondary | ICD-10-CM | POA: Diagnosis not present

## 2023-08-18 LAB — GLUCOSE, CAPILLARY
Glucose-Capillary: 145 mg/dL — ABNORMAL HIGH (ref 70–99)
Glucose-Capillary: 261 mg/dL — ABNORMAL HIGH (ref 70–99)
Glucose-Capillary: 199 mg/dL — ABNORMAL HIGH (ref 70–99)
Glucose-Capillary: 233 mg/dL — ABNORMAL HIGH (ref 70–99)

## 2023-08-18 MED ORDER — MIDODRINE HCL 5 MG PO TABS
5.0000 mg | ORAL_TABLET | Freq: Three times a day (TID) | ORAL | 0 refills | Status: DC
  Filled 2023-08-18: qty 90, 30d supply, fill #0

## 2023-08-18 MED ORDER — NYSTATIN 100000 UNIT/GM EX POWD
Freq: Three times a day (TID) | CUTANEOUS | 0 refills | Status: DC
  Filled 2023-08-18: qty 30, 7d supply, fill #0

## 2023-08-18 MED ORDER — LOPERAMIDE HCL 2 MG PO CAPS: 2.0000 mg | ORAL_CAPSULE | ORAL | Status: DC | PRN

## 2023-08-18 MED ORDER — SENNOSIDES-DOCUSATE SODIUM 8.6-50 MG PO TABS
1.0000 | ORAL_TABLET | Freq: Every day | ORAL | Status: DC
Start: 2023-08-18 — End: 2023-08-18

## 2023-08-18 MED ORDER — MIDODRINE HCL 5 MG PO TABS
5.0000 mg | ORAL_TABLET | Freq: Three times a day (TID) | ORAL | Status: DC
  Administered 2023-08-18 – 2023-08-19 (×3): 5 mg via ORAL
  Filled 2023-08-18 (×3): qty 1

## 2023-08-18 MED ORDER — LOPERAMIDE HCL 2 MG PO CAPS
2.0000 mg | ORAL_CAPSULE | ORAL | Status: DC | PRN
  Administered 2023-08-18: 2 mg via ORAL
  Filled 2023-08-18: qty 1

## 2023-08-18 NOTE — Telephone Encounter (Signed)
 Phase II referral for Cardiac Rehab faxed to [facility]

## 2023-08-18 NOTE — Progress Notes (Addendum)
 Inpatient Rehabilitation Discharge Medication Review by a Pharmacist  A complete drug regimen review was completed for this patient to identify any potential clinically significant medication issues.  High Risk Drug Classes Is patient taking? Indication by Medication  Antipsychotic No   Anticoagulant Yes Eliquis : hx of Left femoral DVT (1/25)   Antibiotic Yes Augmentin : Urosepsis/E. coli bacteremia   Opioid No   Antiplatelet Yes Plavix - cva ppx  Hypoglycemics/insulin  Yes Metformin - T2DM  Vasoactive Medication Yes Midodrine  - hypotension  Chemotherapy No   Other Yes Lipitor - HLD Proscar - BPH Hyoscyamine - bladder spasms Protonix - GERD Wellbutrin - MDD     Type of Medication Issue Identified Description of Issue Recommendation(s)  Drug Interaction(s) (clinically significant)     Duplicate Therapy     Allergy     No Medication Administration End Date     Incorrect Dose     Additional Drug Therapy Needed     Significant med changes from prior encounter (inform family/care partners about these prior to discharge).    Other       Clinically significant medication issues were identified that warrant physician communication and completion of prescribed/recommended actions by midnight of the next day:  No   Time spent performing this drug regimen review (minutes): 30   Thank you for allowing pharmacy to be a part of this patient's care.   Marleta Simmer BS, PharmD, BCPS Clinical Pharmacist 08/18/2023 10:19 AM  Contact: [phone] after 3 PM  "Be curious, not judgmental..." -Rumalda Counter  5/9 addendum: - Midodrine  added 5/8 and to continue at discharge. To stay off Carvedilol , Spironolactone  and Tamsulosin . - Augmentin  for 3 more days.  Tex Filbert, RPh 08/19/2023 12:25 PM

## 2023-08-18 NOTE — Progress Notes (Signed)
   08/18/23 1601  Spiritual Encounters  Type of Visit Initial  Care provided to: Patient  Reason for visit Routine spiritual support  OnCall Visit No   Responded to consult. Provided spiritual care via reflective listening and compassion presence. Patient was expecting to be discharged today. Perhaps he will be discharged tomorrow. Productive conversation. Patient request a return visit tomorrow if he is still in the hospital.

## 2023-08-18 NOTE — Progress Notes (Signed)
 PROGRESS NOTE   Subjective/Complaints: Continues to have symptomatic hypotension, will push d/c to Saturday, d/ced flomax , using ace and teds, abdominal binder ordered  ROS: +abdominal pain, +constipation, +symptomatic hypotension   Objective:   DG HIP UNILAT WITH PELVIS 2-3 VIEWS LEFT Result Date: 08/17/2023 CLINICAL DATA:  Left hip pain. EXAM: DG HIP (WITH OR WITHOUT PELVIS) 2-3V LEFT COMPARISON:  None Available. FINDINGS: There is no evidence of hip fracture or dislocation. Femoral heads are seated within the acetabula. Moderate degenerative changes of the left hip with joint space narrowing, subchondral sclerosis and marginal osteophytosis. Similar degenerative changes are noted of the partially evaluated right hip. Sacroiliac joints and pubic symphysis are anatomically aligned. Vascular calcifications are noted. IMPRESSION: 1. No acute osseous abnormality. 2. Moderate osteoarthritis of the left hip. Electronically Signed   By: Mannie Seek M.D.   On: 08/17/2023 18:33   Recent Labs    08/17/23 1208  WBC 10.0  HGB 11.1*  HCT 35.0*  PLT 379   Recent Labs    08/17/23 1208  NA 136  K 3.9  CL 104  CO2 22  GLUCOSE 324*  BUN 6*  CREATININE 0.93  CALCIUM  9.3    Intake/Output Summary (Last 24 hours) at 08/18/2023 1122 Last data filed at 08/18/2023 6387 Gross per 24 hour  Intake 240 ml  Output 1150 ml  Net -910 ml     Pressure Injury 08/06/23 Sacrum Medial Stage 1 -  Intact skin with non-blanchable redness of a localized area usually over a bony prominence. (Active)  08/06/23 2250  Location: Sacrum  Location Orientation: Medial  Staging: Stage 1 -  Intact skin with non-blanchable redness of a localized area usually over a bony prominence.  Wound Description (Comments):   Present on Admission: Yes    Physical Exam: Vital Signs Blood pressure 125/82, pulse 85, temperature 98.2 F (36.8 C), resp. rate 20, height 6'  (1.829 m), weight 89.8 kg, SpO2 96%.  General: Alert and oriented x 3, No apparent distress HEENT: Head is normocephalic, atraumatic, PERRLA, EOMI, sclera anicteric, oral mucosa pink and moist, dentition intact, ext ear canals clear,  Neck: Supple without JVD or lymphadenopathy Heart: Reg rate and rhythm. No murmurs rubs or gallops, +orthostatic hypotension Chest: CTA bilaterally without wheezes, rales, or rhonchi; no distress Abdomen: Soft, non-tender, non-distended, bowel sounds positive. Extremities: No clubbing, cyanosis, or edema. Pulses are 2+ Psych: Pt's affect is appropriate. Pt is cooperative Skin: some chronic changes in LE's. A few scattered abrasions and bruises. Pacer upper left chest wall. Uro: foley with clear, yellow urine Neuro:  Alert and oriented x 3. Normal insight and awareness. Intact Memory. Normal language and speech. Cranial nerve exam unremarkable. MMT: BUE 4/5 deltoids,4+ biceps, triceps, hands and wrists. BLE 4/5 HF, KE and 4+/5 ADF/PF. Sensory exam normal for light touch and pain in all 4 limbs. No limb ataxia or cerebellar signs. No abnormal tone appreciated.  Stable 5/7 Musculoskeletal: Full ROM, No pain with AROM or PROM in the neck, trunk, or extremities. Posture appropriate     Assessment/Plan: 1. Functional deficits which require 3+ hours per day of interdisciplinary therapy in a comprehensive inpatient rehab setting. Physiatrist is providing  close team supervision and 24 hour management of active medical problems listed below. Physiatrist and rehab team continue to assess barriers to discharge/monitor patient progress toward functional and medical goals  Care Tool:  Bathing    Body parts bathed by patient: Right arm, Left arm, Chest, Abdomen, Front perineal area, Right upper leg, Left upper leg, Right lower leg, Left lower leg, Face, Buttocks   Body parts bathed by helper: Buttocks     Bathing assist Assist Level: Supervision/Verbal cueing     Upper  Body Dressing/Undressing Upper body dressing   What is the patient wearing?: Pull over shirt    Upper body assist Assist Level: Independent    Lower Body Dressing/Undressing Lower body dressing      What is the patient wearing?: Underwear/pull up, Incontinence brief     Lower body assist Assist for lower body dressing: Supervision/Verbal cueing     Toileting Toileting    Toileting assist Assist for toileting: Supervision/Verbal cueing     Transfers Chair/bed transfer  Transfers assist     Chair/bed transfer assist level: Contact Guard/Touching assist     Locomotion Ambulation   Ambulation assist      Assist level: Supervision/Verbal cueing Assistive device: Walker-rolling Max distance: 150'   Walk 10 feet activity   Assist     Assist level: Supervision/Verbal cueing Assistive device: Walker-rolling   Walk 50 feet activity   Assist    Assist level: Supervision/Verbal cueing Assistive device: Walker-rolling    Walk 150 feet activity   Assist    Assist level: Supervision/Verbal cueing Assistive device: Walker-rolling    Walk 10 feet on uneven surface  activity   Assist Walk 10 feet on uneven surfaces activity did not occur: Safety/medical concerns   Assist level: Supervision/Verbal cueing Assistive device: Walker-rolling   Wheelchair     Assist Is the patient using a wheelchair?: No Type of Wheelchair: Manual    Wheelchair assist level: Dependent - Patient 0% Max wheelchair distance: 200 ft    Wheelchair 50 feet with 2 turns activity    Assist        Assist Level: Dependent - Patient 0%   Wheelchair 150 feet activity     Assist      Assist Level: Dependent - Patient 0%   Blood pressure 125/82, pulse 85, temperature 98.2 F (36.8 C), resp. rate 20, height 6' (1.829 m), weight 89.8 kg, SpO2 96%.  Medical Problem List and Plan: 1. Functional deficits secondary to debility after urosepsis, NSTEMI              -pt is DNR-limited             -patient may  shower             -ELOS/Goals: 7 days, goals mod I to supervision  Continue CIR  Postpone d/c to Saturday given continued symptomatic orthostatic hypotension  2.  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 Pain Management: fairly well controlled at present.              -tylenol  prn  4. Mood/Behavior/Sleep: team provide ego support             -trazodone  prn for sleep             -antipsychotic agents: n/a 5. Neuropsych/cognition: This patient is capable of making decisions on his own behalf.  6.  Skin/Wound Care: Sacral wound is chronic, generally healed, stage I at this point             -continue Gerhardt's cream to buttocks/sacrum             -nutrition and appropriate pressure relief             -pt with multiple bruises and ecchymoses related to falls, blood draws, etc  7. Fluids/Electrolytes/Nutrition: pt's intake has been inconsistent             -reviewd importance of po intake with pt             -will ask RD to follow along as well, protein supps             -5/4 will liberalize to regular diet to encourage better intake but will need to watch what he eats given gall bladder             -I personally reviewed the patient's labs today.    8. Urosepsis/ E Coli bacteremia             -pt completed ancef  on acute/last night             -5/4-begin augmentin  today per ID and continue for 8 days for a total of 2 weeks of antibiotics  5/6: consulted with ID since Augmentin  causes GI distress but they do not recommend alternative antibiotics  9. BPH with bladder outlet obstruction             -continue chronic foley, urology consulted on acute             -pt will see urology as outpt in follow up             -flush catheter as needed for obstruction or any leakage--order is in             -flomax , proscar              -hyoscyamine  for bladder  spasms  Flomax  changed to HS since patient only wants to take 5 medications at 1 time  10. NSTEMI/CAD             -pt with 3 vessel disease, s/John angioplasty/stent LAD             -plavix , no asa as on eliquis  for DVT             -continue spironolactone , coreg  changed to HS only since patient only wants to take 5 medications at one time             -monitor bps, weights and with increased activity during therapy  11. Cholelithiasis, biliary diskynesia, chronic cholecystitis             -Cholecystectomy postponed d/t MI             -elective chole in 6 mos             -abx coverage x 1 week recommended (augmentin ), discussed that he will try this today after constipation is relieved  12. Hypotension: d/c Coreg ; confirmed with cardiology this is ok, continues teds/ace wrap, midodrine ordered, flomax  d/ced, abdominal binder ordered  13. Chronic anemia             -d/c iron supplement given constipation and since patient only wants to take 5 medications at a time, Hgb reviewed and is improved  14. Constipation: resolved, decrease senna-docusate to 1 tab HS    LOS: 5  days A FACE TO FACE EVALUATION WAS PERFORMED  John [name] [name] 08/18/2023, 11:22 AM

## 2023-08-18 NOTE — Progress Notes (Signed)
 Physical Therapy Session Note  Patient Details  Name: Marks Langolf MRN: [id number] Date of Birth: 10/25/1945  Today's Date: 08/18/2023 PT Individual Time: 1030-1115 PT Individual Time Calculation (min): 45 min   Short Term Goals: Week 1:  PT Short Term Goal 1 (Week 1): STG = LTG d/t ELOS  Skilled Therapeutic Interventions/Progress Updates:      Pt in bed to start and is in agreement to therapy session. Reports arthritic hip pain in L - rest breaks and mobility provided for pain management. Supine<>sitting EOB mod I using hospital bed features. Pt's legs are ace wrapped and is wearing compression socks underneath as well. Pt denies initial symptoms. Stand pivot transfer with RW and CGA from EOB to wheelchair with cues for safety and balance. Transported patient to main gym for energy conservation. Orthostatic BP checked:  Sitting: BP 99/59 HR 81 Standing: BP 79/51 HR 96 *pt symptomatic.  While obtaining standing BP; patient with large loose BM (pt aware). Pt returned to his room for clean up and brief change. Pt standing at the sink for > 3-5 minutes for standing tolerance and balance while provided totalA for posterior pericare, needing thorough cleaning. Medical team made aware of loose incontinent stools. New brief and new pants donned with maxA. Pt requesting at this point of session to return to bed to rest, frustrated with his incontinence and dropping BP. Returned to bed with CGA and patient left with alarm on and needs met. He missed the last 15 minutes due to fatigue.    Therapy Documentation Precautions:  Precautions Precautions: Fall Recall of Precautions/Restrictions: Intact Precaution/Restrictions Comments: angioplasty and stent placement to the mid LAD, stent in LLE, foley cath, pacemaker, low activity tolerance, watch BP, hx of breast CA Restrictions Weight Bearing Restrictions Per Provider Order: No General:      Therapy/Group: Individual Therapy  Pheobe Brass 08/18/2023, 7:49 AM

## 2023-08-18 NOTE — Progress Notes (Signed)
 Orthopedic Tech Progress Note Patient Details:  John [name] May 13, 1945 [id number]  Abdominal binder order is under nursing orders.  Ortho Devices Type of Ortho Device: Abdominal binder Ortho Device/Splint Location: at bedside Ortho Device/Splint Interventions: Ordered   Post Interventions Instructions Provided: Care of device, Adjustment of device  John [name] [name] 08/18/2023, 1:20 PM

## 2023-08-18 NOTE — Progress Notes (Signed)
 Patient ID: John [name], male   DOB: 1945/07/31, 78 y.o.   MRN: [id number]  Met with pt to update him regarding team conference and medical issues. He hopes to go home tomorrow and wants home health since still had a HHRN going out to see him., have figured out [facility] agency active with [facility]. Will sent referral and made aware of possible discharge tomorrow

## 2023-08-18 NOTE — Progress Notes (Signed)
 Occupational Therapy Session Note  Patient Details  Name: John [name] MRN: [id number] Date of Birth: 03-14-1946  Today's Date: 08/18/2023 OT Individual Time: 0848-1002+1407-1505 OT Individual Time Calculation (min): 74 min    Short Term Goals: Week 1:  OT Short Term Goal 1 (Week 1): STG=LTG d/t ELOS  Skilled Therapeutic Interventions/Progress Updates:  Session 1: Pt greeted supine in bed, pt agreeable to OT intervention.       Supine BP- 116/56( 73) HR 83  Sitting with teds- 97/55( 70) HR 88 Standing 77/50( 59) HR 101  Pt denies symptoms but just states "I was tired so I needed to sit down."   Transfers/bed mobility/functional mobility:  Pt completed supine>sit with CGA, sit>stand from elevated EOB with RW and CGA. Stand pivot to w/c with RW MODI.    ADLs:  Grooming: pt stood at sink for hand hygiene with supervision d/t OH  LB dressing: donned pants with MIN A to manage foley.   Transfers: ambulatory transfer into bathroom with RW and supervision.  Toileting: pt with incontinent BM, pt reports this is baseline. Pt able to complete pericare and clothing mgmt with supervision but + time d/t pt feeling like stool was "stuck" in rectum. Ended up providing MIN A for cleanliness.   Of note cardiology entered during session requesting for ace wraps to be donned over teds, donned ace wraps with total A from bed level.   Ended session with pt supine in bed with all needs within reach and bed alarm activated.                   Session 2:  Pt greeted supine in bed, pt agreeable to OT intervention but session limited by bowel incontinence   Transfers/bed mobility/functional mobility: pt completed supine>sit with + time but MODI with use of bed features. MIN A to rise from elevated EOB. Ambulatory transfer into bathroom for toileting with RW and close supervision.   Unable to assess d/t intensity and frequency of bowel movement however pt denied dizziness.   ADLs:   Transfers: pt  completed ambulatory transfer into bathroom with RW and close supervision.  Toileting: pt found with incontinent BM but did have one continent episode while on the toilet. Pt able to complete pericare and clothing mgmt with supervision but did provide at least MIN A for cleanliness.   Education: wife called during session requesting updates on therapy progress and pending DC. Updated wife that d/t incontinence and OH it seems that DC is pending, however alerted PA to f/u with wife.    Ended session with pt supine in bed with all needs within reach and bed alarm activated.                    Therapy Documentation Precautions:  Precautions Precautions: Fall Recall of Precautions/Restrictions: Intact Precaution/Restrictions Comments: angioplasty and stent placement to the mid LAD, stent in LLE, foley cath, pacemaker, low activity tolerance, watch BP, hx of breast CA Restrictions Weight Bearing Restrictions Per Provider Order: No  Pain: Session 1: unrated pain reported in buttock, offered toileting Session 2: unrated pain from frequent BMs, repositioning provided.    Therapy/Group: Individual Therapy  [name] 08/18/2023, 12:17 PM

## 2023-08-18 NOTE — Progress Notes (Addendum)
 Rounding Note    Patient Name: John [name] Date of Encounter: 08/18/2023  [facility] Cardiologist: [name], MD   Subjective   Pt found in bed with OT. Does not yet have thigh wraps or abdominal binder, have been ordered. He ate a good breakfast, but high in sodium.   Inpatient Medications    Scheduled Meds:  amoxicillin -clavulanate  1 tablet Oral Q12H   apixaban   5 mg Oral BID   atorvastatin   80 mg Oral QHS   Chlorhexidine  Gluconate Cloth  6 each Topical Daily   clopidogrel   75 mg Oral Q breakfast   vitamin B-12  1,000 mcg Oral Daily   feeding supplement  237 mL Oral BID BM   finasteride   5 mg Oral Daily   Gerhardt's butt cream   Topical BID   insulin  aspart  0-9 Units Subcutaneous TID AC & HS   metFORMIN   500 mg Oral BID WC   nystatin    Topical TID   pantoprazole   40 mg Oral QHS   senna-docusate  2 tablet Oral QHS   tamsulosin   0.4 mg Oral QPC supper   Continuous Infusions:  PRN Meds: acetaminophen  **OR** acetaminophen , bacitracin , benzonatate , bisacodyl, hyoscyamine , ondansetron  **OR** ondansetron  (ZOFRAN ) IV, polyethylene glycol, sorbitol , traZODone    Vital Signs    Vitals:   08/17/23 0610 08/17/23 1315 08/17/23 2011 08/18/23 0629  BP: 122/63 139/67 (!) 140/66 125/82  Pulse: 73 78 85 85  Resp: 18 18 16 20   Temp: 98 F (36.7 C) 97.6 F (36.4 C) 99.2 F (37.3 C) 98.2 F (36.8 C)  TempSrc:  Oral    SpO2: 93% 100% 94% 96%  Weight:      Height:        Intake/Output Summary (Last 24 hours) at 08/18/2023 0915 Last data filed at 08/18/2023 0811 Gross per 24 hour  Intake 240 ml  Output 1150 ml  Net -910 ml      08/13/2023    6:13 PM 08/08/2023    4:00 AM 08/06/2023   10:50 PM  Last 3 Weights  Weight (lbs) 197 lb 15.6 oz 202 lb 2.6 oz 198 lb 6.6 oz  Weight (kg) 89.8 kg 91.7 kg 90 kg      Telemetry    NA - Personally Reviewed  ECG    No new tracings - Personally Reviewed  Physical Exam   GEN: No acute distress.   Neck: No  JVD Cardiac: RRR, no murmurs, rubs, or gallops.  Respiratory: Clear to auscultation bilaterally. GI: Soft, nontender, non-distended  MS: No edema; No deformity. Neuro:  Nonfocal  Psych: Normal affect   Labs    High Sensitivity Troponin:   Recent Labs  Lab 08/10/23 1159 08/10/23 1411  TROPONINIHS 767* 750*     Chemistry Recent Labs  Lab 08/12/23 1019 08/13/23 0937 08/15/23 0540 08/17/23 1208  NA 139 139 138 136  K 3.7 3.6 3.4* 3.9  CL 105 103 106 104  CO2 21* 27 27 22   GLUCOSE 174* 207* 183* 324*  BUN 5* 5* 6* 6*  CREATININE 0.74 0.88 0.81 0.93  CALCIUM  8.9 9.2 8.7* 9.3  MG 1.8  --   --   --   PROT 5.5* 5.8* 5.1*  --   ALBUMIN 2.7* 2.8* 2.5*  --   AST 26 25 17   --   ALT 14 15 10   --   ALKPHOS 54 63 51  --   BILITOT 1.4* 1.4* 2.1*  --   GFRNONAA >60 >  60 >60 >60  ANIONGAP 13 9 5 10     Lipids No results for input(s): "CHOL", "TRIG", "HDL", "LABVLDL", "LDLCALC", "CHOLHDL" in the last 168 hours.  Hematology Recent Labs  Lab 08/12/23 1019 08/15/23 0540 08/17/23 1208  WBC 7.4 7.5 10.0  RBC 3.89* 3.66* 4.01*  HGB 10.8* 10.3* 11.1*  HCT 33.7* 31.8* 35.0*  MCV 86.6 86.9 87.3  MCH 27.8 28.1 27.7  MCHC 32.0 32.4 31.7  RDW 18.5* 18.6* 18.3*  PLT 276 322 379   Thyroid  No results for input(s): "TSH", "FREET4" in the last 168 hours.  BNPNo results for input(s): "BNP", "PROBNP" in the last 168 hours.  DDimer No results for input(s): "DDIMER" in the last 168 hours.   Radiology    DG HIP UNILAT WITH PELVIS 2-3 VIEWS LEFT Result Date: 08/17/2023 CLINICAL DATA:  Left hip pain. EXAM: DG HIP (WITH OR WITHOUT PELVIS) 2-3V LEFT COMPARISON:  None Available. FINDINGS: There is no evidence of hip fracture or dislocation. Femoral heads are seated within the acetabula. Moderate degenerative changes of the left hip with joint space narrowing, subchondral sclerosis and marginal osteophytosis. Similar degenerative changes are noted of the partially evaluated right hip. Sacroiliac  joints and pubic symphysis are anatomically aligned. Vascular calcifications are noted. IMPRESSION: 1. No acute osseous abnormality. 2. Moderate osteoarthritis of the left hip. Electronically Signed   By: Mannie Seek M.D.   On: 08/17/2023 18:33    Cardiac Studies   Echo 08/11/23: 1. Left ventricular ejection fraction, by estimation, is 30 to 35%. The  left ventricle has moderately decreased function. The left ventricle  demonstrates regional wall motion abnormalities with mid to apical  anteroseptal and inferoseptal akinesis and  akinesis of the apical inferior/anterior/lateral walls. Akinesis of the  true apex. Findings suggest LAD territory infarction. No LV thrombus.  There is mild concentric left ventricular hypertrophy. Left ventricular  diastolic parameters are consistent with   Grade I diastolic dysfunction (impaired relaxation).   2. Right ventricular systolic function is normal. The right ventricular  size is normal. There is mildly elevated pulmonary artery systolic  pressure. The estimated right ventricular systolic pressure is 41.9 mmHg.   3. The aortic valve is tricuspid. There is mild calcification of the  aortic valve. Aortic valve regurgitation is mild. No aortic stenosis is  present.   4. The inferior vena cava is dilated in size with >50% respiratory  variability, suggesting right atrial pressure of 8 mmHg.   5. The mitral valve is normal in structure. No evidence of mitral valve  regurgitation. No evidence of mitral stenosis.   Patient Profile     78 y.o. male  with past medical history of hypertension, diabetes mellitus, history of breast cancer, recent CVA, status post recent pacemaker for complete heart block admitted with confusion, sepsis and cholecystitis for evaluation prior to surgery.  Patient had an episode of hypotension on the floor with systolic in the 60s associated with anterior T wave changes.  Cardiac catheterization performed April 30 showed 95%  proximal RCA, 80% PDA, 95% mid LAD, 80% first diagonal.  The culprit lesion was felt to be the mid LAD and patient had PCI with drug-eluting stent.  The right coronary artery was felt to have extensive small vessel disease and was nondominant and will be treated medically.  Follow-up echocardiogram showed ejection fraction 30 to 35%, mid to apical anteroseptal and inferoseptal akinesis, grade 1 diastolic dysfunction, mild aortic insufficiency.    Cardiology was re-engaged in rehab due to orthostatic  hypotension. Coreg  held.  Assessment & Plan    Orthostatic hypotension - orthostatic yesterday with a nearly 60 point drop in SBP with standing in the setting of poor PO intake - coreg  held, gentle IVF given - midodrine considered yesterday but not yet started given improvement in orthostatic vitals improved with IVF - encouraged increased protein intake and hydration  - spironolactone  discontinued yesterday - he remains orthostatic today with:  Supine BP- 116/56( 73) HR 83  Sitting with teds- 97/55( 70) HR 88  Standing 77/50( 59) HR 101   - will start 5 mg midodrine TID today - once we have controlled his BP, will attempt to restart 12.5 mg spironolactone  - will request another set of orthostatic vitals this afternoon after midodrine and lunch   NSTEMI - PCI/DES-LAD - no ASA given eliquis , continue plavix  - no chest pain   Ischemic cardiomyopathy - LVEF 30-35% with WMA - GDMT has been limited due to BP, orthostasis, and anaphylaxix to ACEI - currently euvolemic - coreg  and spironolactone  discontinued due to orthostatic hypotension - now off all cardiac medications, will need to follow strict low sodium diet with 1500 mg sodium restriction   Recent PE - continue eliquis    PPM in place for CHB - EP reprogrammed this hospitalization   Cholecystitis - no abdominal pain, plans for lap chole on hold given recent stent placement      For questions or updates, please contact  [facility] Please consult [email or web address] for contact info under        Signed, Lamond Pilot, PA  08/18/2023, 9:15 AM    Attending Note:   The patient was seen and examined.  Agree with assessment and plan as noted above.  Changes made to the above note as needed.  Patient seen and independently examined with Romualdo Cobble, PA .   We discussed all aspects of the encounter. I agree with the assessment and plan as stated above.    Orthostatic hypotension:  still having orthostasis off all meds.   We have started midodrine  5 TID .  If he remains stable overnight and tomorrow, he should be able to be discharged   He will follow up with Dr. Berry Bristol   2.  HFrEF :   we have not been able to keep him on any GDMT for his CHF due to his orthostatic hypotension .  Will follow up in the office      I have spent a total of 40 minutes with patient reviewing hospital  notes , telemetry, EKGs, labs and examining patient as well as establishing an assessment and plan that was discussed with the patient.  > 50% of time was spent in direct patient care.   [place], Marieta Shorten., MD, [facility] 08/18/2023, 3:30 PM [address]  [address] Office [phone] Pager [phone]

## 2023-08-19 ENCOUNTER — Telehealth: Payer: Self-pay | Admitting: Cardiology

## 2023-08-19 ENCOUNTER — Telehealth: Payer: Self-pay | Admitting: Physical Medicine and Rehabilitation

## 2023-08-19 DIAGNOSIS — I950 Idiopathic hypotension: Secondary | ICD-10-CM

## 2023-08-19 DIAGNOSIS — R5381 Other malaise: Secondary | ICD-10-CM | POA: Diagnosis not present

## 2023-08-19 LAB — GLUCOSE, CAPILLARY: Glucose-Capillary: 165 mg/dL — ABNORMAL HIGH (ref 70–99)

## 2023-08-19 NOTE — Telephone Encounter (Signed)
 Patient is requesting to switch from Dr. Audery Blazing to Dr. Berry Bristol because his wife sees him. Please advise. Thank you.

## 2023-08-19 NOTE — Progress Notes (Signed)
 Patient ID: John [name], male   DOB: 04-17-1945, 78 y.o.   MRN: [id number] MD's feel pt is stable for discharge home today. Will inform HH.

## 2023-08-19 NOTE — Progress Notes (Signed)
 Inpatient Rehabilitation Care Coordinator Discharge Note   Patient Details  Name: John [name] MRN: [id number] Date of Birth: [birth year]   Discharge location: HOME WITH WIFE WHO CAN PROVIDE 24/7 SUPERVISION  Length of Stay: 6 days  Discharge activity level: SUPERVISION LEVEL DUE TO BALANCE ISSUES  Home/community participation: ACTIVE  Patient response LK:GMWNUU Literacy - How often do you need to have someone help you when you read instructions, pamphlets, or other written material from your doctor or pharmacy?: Never  Patient response VO:ZDGUYQ Isolation - How often do you feel lonely or isolated from those around you?: Never  Services provided included: MD, RD, PT, OT, RN, CM, Pharmacy, SW  Financial Services:  Field seismologist Utilized: Marine scientist MEDICARE  Choices offered to/list presented to: PT  Follow-up services arranged:  Home Health, Patient/Family request agency HH/DME Home Health Agency: [facility]  PT  OT   RN   HAS ALL DME FROM PREVIOUS ADMISSIONS   HH/DME Requested Agency: [facility]  Patient response to transportation need: Is the patient able to respond to transportation needs?: Yes In the past 12 months, has lack of transportation kept you from medical appointments or from getting medications?: No In the past 12 months, has lack of transportation kept you from meetings, work, or from getting things needed for daily living?: No   Patient/Family verbalized understanding of follow-up arrangements:  Yes  Individual responsible for coordination of the follow-up plan: [name] [phone]  Confirmed correct DME delivered: Mardell Shade 08/19/2023    Comments (or additional information):PT DELAYED WITH DISCHARGE DUE TO BP AND MEDICAL ISSUES.   Summary of Stay    Date/Time Discharge Planning CSW  08/17/23 0919 Home with wife who is able to assist and was prior to admission. Pt would like to go home ASAP RGD        Nitesh Pitstick G

## 2023-08-19 NOTE — Telephone Encounter (Signed)
 Patients wife called to schedule a 6 month follow up , patient was discharged from the hospital and I only see follow up as needed .

## 2023-08-19 NOTE — Progress Notes (Signed)
 PROGRESS NOTE   Subjective/Complaints: BP stable this morning Patient feels ready to go home Discussed with cardiologist Dr. Floria Hurst who feels he is stable from cardiovascular standpoint  ROS: +abdominal pain, +constipation, +symptomatic hypotension- resolved   Objective:   DG HIP UNILAT WITH PELVIS 2-3 VIEWS LEFT Result Date: 08/17/2023 CLINICAL DATA:  Left hip pain. EXAM: DG HIP (WITH OR WITHOUT PELVIS) 2-3V LEFT COMPARISON:  None Available. FINDINGS: There is no evidence of hip fracture or dislocation. Femoral heads are seated within the acetabula. Moderate degenerative changes of the left hip with joint space narrowing, subchondral sclerosis and marginal osteophytosis. Similar degenerative changes are noted of the partially evaluated right hip. Sacroiliac joints and pubic symphysis are anatomically aligned. Vascular calcifications are noted. IMPRESSION: 1. No acute osseous abnormality. 2. Moderate osteoarthritis of the left hip. Electronically Signed   By: Mannie Seek M.D.   On: 08/17/2023 18:33   Recent Labs    08/17/23 1208  WBC 10.0  HGB 11.1*  HCT 35.0*  PLT 379   Recent Labs    08/17/23 1208  NA 136  K 3.9  CL 104  CO2 22  GLUCOSE 324*  BUN 6*  CREATININE 0.93  CALCIUM  9.3    Intake/Output Summary (Last 24 hours) at 08/19/2023 1054 Last data filed at 08/19/2023 0536 Gross per 24 hour  Intake 120 ml  Output 1175 ml  Net -1055 ml         Physical Exam: Vital Signs Blood pressure 129/60, pulse 76, temperature 98.4 F (36.9 C), resp. rate 18, height 6' (1.829 m), weight 89.8 kg, SpO2 95%.  General: Alert and oriented x 3, No apparent distress HEENT: Head is normocephalic, atraumatic, PERRLA, EOMI, sclera anicteric, oral mucosa pink and moist, dentition intact, ext ear canals clear,  Neck: Supple without JVD or lymphadenopathy Heart: Reg rate and rhythm. No murmurs rubs or gallops, +orthostatic  hypotension Chest: CTA bilaterally without wheezes, rales, or rhonchi; no distress Abdomen: Soft, non-tender, non-distended, bowel sounds positive. Extremities: No clubbing, cyanosis, or edema. Pulses are 2+ Psych: Pt's affect is appropriate. Pt is cooperative Skin: some chronic changes in LE's. A few scattered abrasions and bruises. Pacer upper left chest wall. Uro: foley with clear, yellow urine Neuro:  Alert and oriented x 3. Normal insight and awareness. Intact Memory. Normal language and speech. Cranial nerve exam unremarkable. MMT: BUE 4/5 deltoids,4+ biceps, triceps, hands and wrists. BLE 4/5 HF, KE and 4+/5 ADF/PF. Sensory exam normal for light touch and pain in all 4 limbs. No limb ataxia or cerebellar signs. No abnormal tone appreciated.  Stable 5/9 Musculoskeletal: Full ROM, No pain with AROM or PROM in the neck, trunk, or extremities. Posture appropriate     Assessment/Plan: 1. Functional deficits which require 3+ hours per day of interdisciplinary therapy in a comprehensive inpatient rehab setting. Physiatrist is providing close team supervision and 24 hour management of active medical problems listed below. Physiatrist and rehab team continue to assess barriers to discharge/monitor patient progress toward functional and medical goals  Care Tool:  Bathing    Body parts bathed by patient: Right arm, Left arm, Chest, Abdomen, Front perineal area, Right upper leg, Left upper leg,  Right lower leg, Left lower leg, Face, Buttocks   Body parts bathed by helper: Buttocks     Bathing assist Assist Level: Supervision/Verbal cueing     Upper Body Dressing/Undressing Upper body dressing   What is the patient wearing?: Pull over shirt    Upper body assist Assist Level: Independent    Lower Body Dressing/Undressing Lower body dressing      What is the patient wearing?: Underwear/pull up, Incontinence brief     Lower body assist Assist for lower body dressing: Minimal  Assistance - Patient > 75% (to manage foley)     Toileting Toileting    Toileting assist Assist for toileting: Minimal Assistance - Patient > 75% (for cleanliness)     Transfers Chair/bed transfer  Transfers assist     Chair/bed transfer assist level: Supervision/Verbal cueing     Locomotion Ambulation   Ambulation assist      Assist level: Supervision/Verbal cueing Assistive device: Walker-rolling Max distance: 150'   Walk 10 feet activity   Assist     Assist level: Supervision/Verbal cueing Assistive device: Walker-rolling   Walk 50 feet activity   Assist    Assist level: Supervision/Verbal cueing Assistive device: Walker-rolling    Walk 150 feet activity   Assist    Assist level: Supervision/Verbal cueing Assistive device: Walker-rolling    Walk 10 feet on uneven surface  activity   Assist Walk 10 feet on uneven surfaces activity did not occur: Safety/medical concerns   Assist level: Supervision/Verbal cueing Assistive device: Walker-rolling   Wheelchair     Assist Is the patient using a wheelchair?: No Type of Wheelchair: Manual    Wheelchair assist level: Dependent - Patient 0% Max wheelchair distance: 200 ft    Wheelchair 50 feet with 2 turns activity    Assist        Assist Level: Dependent - Patient 0%   Wheelchair 150 feet activity     Assist      Assist Level: Dependent - Patient 0%   Blood pressure 129/60, pulse 76, temperature 98.4 F (36.9 C), resp. rate 18, height 6' (1.829 m), weight 89.8 kg, SpO2 95%.  Medical Problem List and Plan: 1. Functional deficits secondary to debility after urosepsis, NSTEMI             -pt is DNR-limited             -patient may  shower             -ELOS/Goals: 7 days, goals mod I to supervision  D/c home  2.  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 Pain Management: fairly well  controlled at present.              -tylenol  prn  4. Mood/Behavior/Sleep: team provide ego support             -trazodone  prn for sleep             -antipsychotic agents: n/a 5. Neuropsych/cognition: This patient is capable of making decisions on his own behalf.  6. Skin/Wound Care: Sacral wound is chronic, generally healed, stage I at this point             -continue Gerhardt's cream to buttocks/sacrum             -nutrition and appropriate pressure relief             -  pt with multiple bruises and ecchymoses related to falls, blood draws, etc  7. Fluids/Electrolytes/Nutrition: pt's intake has been inconsistent             -reviewd importance of po intake with pt             -will ask RD to follow along as well, protein supps             -5/4 will liberalize to regular diet to encourage better intake but will need to watch what he eats given gall bladder             -I personally reviewed the patient's labs today.    8. Urosepsis/ E Coli bacteremia             -pt completed ancef  on acute/last night             -5/4-begin augmentin  today per ID and continue for 8 days for a total of 2 weeks of antibiotics  5/6: consulted with ID since Augmentin  causes GI distress but they do not recommend alternative antibiotics  9. BPH with bladder outlet obstruction             -continue chronic foley, urology consulted on acute             -pt will see urology as outpt in follow up             -flush catheter as needed for obstruction or any leakage--order is in             -flomax , proscar              -hyoscyamine  for bladder spasms  Flomax  changed to HS since patient only wants to take 5 medications at 1 time  10. NSTEMI/CAD             -pt with 3 vessel disease, s/p angioplasty/stent LAD             -plavix , no asa as on eliquis  for DVT             -continue spironolactone , coreg  changed to HS only since patient only wants to take 5 medications at one time             -monitor bps, weights  and with increased activity during therapy  11. Cholelithiasis, biliary diskynesia, chronic cholecystitis             -Cholecystectomy postponed d/t MI             -elective chole in 6 mos             -abx coverage x 1 week recommended (augmentin ), discussed that he will try this today after constipation is relieved  12. Hypotension: d/c Coreg ; confirmed with cardiology this is ok, continues teds/ace wrap, midodrine  ordered-continue, flomax  d/ced, abdominal binder ordered  13. Chronic anemia             -d/c iron supplement given constipation and since patient only wants to take 5 medications at a time, Hgb reviewed and is improved  14. Constipation: resolved, decrease senna-docusate to 1 tab HS, continue this dose  15. Left hip OA; discussed with patient    >30 minutes spent in discharge of patient including review of medications and follow-up appointments, physical examination, and in answering all patient's questions  LOS: 6 days A FACE TO FACE EVALUATION WAS PERFORMED  Lavell Portugal P Dawn Kiper 08/19/2023, 10:54 AM

## 2023-08-19 NOTE — Telephone Encounter (Signed)
I am good with this

## 2023-08-19 NOTE — Plan of Care (Signed)
  Problem: RH Dressing Goal: LTG Patient will perform lower body dressing w/assist (OT) Description: LTG: Patient will perform lower body dressing with assist, with/without cues in positioning using equipment (OT) Outcome: Not Met (add Reason) Note: Pt requires min A for LB dressing d/t LB strength deficits and for foley management.    Problem: RH Balance Goal: LTG Patient will maintain dynamic standing with ADLs (OT) Description: LTG:  Patient will maintain dynamic standing balance with assist during activities of daily living (OT)  Outcome: Completed/Met   Problem: Sit to Stand Goal: LTG:  Patient will perform sit to stand in prep for activites of daily living with assistance level (OT) Description: LTG:  Patient will perform sit to stand in prep for activites of daily living with assistance level (OT) Outcome: Completed/Met   Problem: RH Grooming Goal: LTG Patient will perform grooming w/assist,cues/equip (OT) Description: LTG: Patient will perform grooming with assist, with/without cues using equipment (OT) Outcome: Completed/Met   Problem: RH Bathing Goal: LTG Patient will bathe all body parts with assist levels (OT) Description: LTG: Patient will bathe all body parts with assist levels (OT) Outcome: Completed/Met   Problem: RH Dressing Goal: LTG Patient will perform upper body dressing (OT) Description: LTG Patient will perform upper body dressing with assist, with/without cues (OT). Outcome: Completed/Met   Problem: RH Toileting Goal: LTG Patient will perform toileting task (3/3 steps) with assistance level (OT) Description: LTG: Patient will perform toileting task (3/3 steps) with assistance level (OT)  Outcome: Completed/Met   Problem: RH Toilet Transfers Goal: LTG Patient will perform toilet transfers w/assist (OT) Description: LTG: Patient will perform toilet transfers with assist, with/without cues using equipment (OT) Outcome: Completed/Met   Problem: RH  Tub/Shower Transfers Goal: LTG Patient will perform tub/shower transfers w/assist (OT) Description: LTG: Patient will perform tub/shower transfers with assist, with/without cues using equipment (OT) Outcome: Completed/Met

## 2023-08-19 NOTE — Progress Notes (Addendum)
 [name] Liaison Note  08/19/2023  John [name] 1945/04/20 [id number]  Location: RN Hospital Liaison screened the patient remotely at [facility].  Insurance: Blue Cross Blue Shield Medicare Advantage   John [name] is a 78 y.o. male who is a patient with Dawna Etienne, MD with [facility]. The patient was screened for  day readmission hospitalization with noted high risk score for unplanned readmission risk with 1 IP/1 ED in 6 months.  The patient was assessed for potential Care Management service needs for post hospital transition for care coordination. Review of patient's electronic medical record reveals patient was admitted for UTI. Pt transition to CIR for in-pt rehabilitation. No VBCI needs at this time as the facility will continue to provide pt's needs.   VBCI Care Management/Population Health does not replace or interfere with any arrangements made by the Inpatient Transition of Care team.   For questions contact:   Lilla Reichert, RN, [facility] Liaison    [facility], Population Health Office Hours MTWF  8:00 am-6:00 pm Direct Dial : [phone] mobile [email or web address]

## 2023-08-21 DIAGNOSIS — Z8616 Personal history of COVID-19: Secondary | ICD-10-CM | POA: Diagnosis not present

## 2023-08-21 DIAGNOSIS — E7800 Pure hypercholesterolemia, unspecified: Secondary | ICD-10-CM | POA: Diagnosis not present

## 2023-08-21 DIAGNOSIS — Z853 Personal history of malignant neoplasm of breast: Secondary | ICD-10-CM | POA: Diagnosis not present

## 2023-08-21 DIAGNOSIS — G4700 Insomnia, unspecified: Secondary | ICD-10-CM | POA: Diagnosis not present

## 2023-08-21 DIAGNOSIS — E1142 Type 2 diabetes mellitus with diabetic polyneuropathy: Secondary | ICD-10-CM | POA: Diagnosis not present

## 2023-08-21 DIAGNOSIS — K589 Irritable bowel syndrome without diarrhea: Secondary | ICD-10-CM | POA: Diagnosis not present

## 2023-08-21 DIAGNOSIS — Z950 Presence of cardiac pacemaker: Secondary | ICD-10-CM | POA: Diagnosis not present

## 2023-08-21 DIAGNOSIS — Z7984 Long term (current) use of oral hypoglycemic drugs: Secondary | ICD-10-CM | POA: Diagnosis not present

## 2023-08-21 DIAGNOSIS — G5600 Carpal tunnel syndrome, unspecified upper limb: Secondary | ICD-10-CM | POA: Diagnosis not present

## 2023-08-21 DIAGNOSIS — K219 Gastro-esophageal reflux disease without esophagitis: Secondary | ICD-10-CM | POA: Diagnosis not present

## 2023-08-21 DIAGNOSIS — I959 Hypotension, unspecified: Secondary | ICD-10-CM | POA: Diagnosis not present

## 2023-08-21 DIAGNOSIS — E43 Unspecified severe protein-calorie malnutrition: Secondary | ICD-10-CM | POA: Diagnosis not present

## 2023-08-21 DIAGNOSIS — F325 Major depressive disorder, single episode, in full remission: Secondary | ICD-10-CM | POA: Diagnosis not present

## 2023-08-21 DIAGNOSIS — E66811 Obesity, class 1: Secondary | ICD-10-CM | POA: Diagnosis not present

## 2023-08-21 DIAGNOSIS — E1122 Type 2 diabetes mellitus with diabetic chronic kidney disease: Secondary | ICD-10-CM | POA: Diagnosis not present

## 2023-08-21 DIAGNOSIS — I129 Hypertensive chronic kidney disease with stage 1 through stage 4 chronic kidney disease, or unspecified chronic kidney disease: Secondary | ICD-10-CM | POA: Diagnosis not present

## 2023-08-21 DIAGNOSIS — N400 Enlarged prostate without lower urinary tract symptoms: Secondary | ICD-10-CM | POA: Diagnosis not present

## 2023-08-21 DIAGNOSIS — Z9181 History of falling: Secondary | ICD-10-CM | POA: Diagnosis not present

## 2023-08-21 DIAGNOSIS — E860 Dehydration: Secondary | ICD-10-CM | POA: Diagnosis not present

## 2023-08-21 DIAGNOSIS — D631 Anemia in chronic kidney disease: Secondary | ICD-10-CM | POA: Diagnosis not present

## 2023-08-21 DIAGNOSIS — N189 Chronic kidney disease, unspecified: Secondary | ICD-10-CM | POA: Diagnosis not present

## 2023-08-21 DIAGNOSIS — H43819 Vitreous degeneration, unspecified eye: Secondary | ICD-10-CM | POA: Diagnosis not present

## 2023-08-23 ENCOUNTER — Other Ambulatory Visit (HOSPITAL_BASED_OUTPATIENT_CLINIC_OR_DEPARTMENT_OTHER): Payer: Self-pay

## 2023-08-23 ENCOUNTER — Encounter: Payer: Self-pay | Admitting: Family Medicine

## 2023-08-23 ENCOUNTER — Ambulatory Visit: Admitting: Family Medicine

## 2023-08-23 VITALS — BP 126/78 | HR 88 | Temp 98.0°F | Resp 16 | Ht 73.0 in | Wt 197.0 lb

## 2023-08-23 DIAGNOSIS — E860 Dehydration: Secondary | ICD-10-CM | POA: Diagnosis not present

## 2023-08-23 DIAGNOSIS — I6930 Unspecified sequelae of cerebral infarction: Secondary | ICD-10-CM

## 2023-08-23 DIAGNOSIS — Z950 Presence of cardiac pacemaker: Secondary | ICD-10-CM | POA: Diagnosis not present

## 2023-08-23 DIAGNOSIS — I959 Hypotension, unspecified: Secondary | ICD-10-CM | POA: Diagnosis not present

## 2023-08-23 DIAGNOSIS — E43 Unspecified severe protein-calorie malnutrition: Secondary | ICD-10-CM | POA: Diagnosis not present

## 2023-08-23 DIAGNOSIS — E1122 Type 2 diabetes mellitus with diabetic chronic kidney disease: Secondary | ICD-10-CM | POA: Diagnosis not present

## 2023-08-23 DIAGNOSIS — F4321 Adjustment disorder with depressed mood: Secondary | ICD-10-CM | POA: Diagnosis not present

## 2023-08-23 DIAGNOSIS — I129 Hypertensive chronic kidney disease with stage 1 through stage 4 chronic kidney disease, or unspecified chronic kidney disease: Secondary | ICD-10-CM | POA: Diagnosis not present

## 2023-08-23 DIAGNOSIS — Z8616 Personal history of COVID-19: Secondary | ICD-10-CM | POA: Diagnosis not present

## 2023-08-23 DIAGNOSIS — G5600 Carpal tunnel syndrome, unspecified upper limb: Secondary | ICD-10-CM | POA: Diagnosis not present

## 2023-08-23 DIAGNOSIS — N189 Chronic kidney disease, unspecified: Secondary | ICD-10-CM | POA: Diagnosis not present

## 2023-08-23 DIAGNOSIS — D631 Anemia in chronic kidney disease: Secondary | ICD-10-CM | POA: Diagnosis not present

## 2023-08-23 DIAGNOSIS — K219 Gastro-esophageal reflux disease without esophagitis: Secondary | ICD-10-CM | POA: Diagnosis not present

## 2023-08-23 DIAGNOSIS — Z853 Personal history of malignant neoplasm of breast: Secondary | ICD-10-CM | POA: Diagnosis not present

## 2023-08-23 DIAGNOSIS — E1142 Type 2 diabetes mellitus with diabetic polyneuropathy: Secondary | ICD-10-CM | POA: Diagnosis not present

## 2023-08-23 DIAGNOSIS — K5900 Constipation, unspecified: Secondary | ICD-10-CM

## 2023-08-23 DIAGNOSIS — E66811 Obesity, class 1: Secondary | ICD-10-CM | POA: Diagnosis not present

## 2023-08-23 DIAGNOSIS — H43819 Vitreous degeneration, unspecified eye: Secondary | ICD-10-CM | POA: Diagnosis not present

## 2023-08-23 DIAGNOSIS — Z7984 Long term (current) use of oral hypoglycemic drugs: Secondary | ICD-10-CM | POA: Diagnosis not present

## 2023-08-23 DIAGNOSIS — Z9181 History of falling: Secondary | ICD-10-CM | POA: Diagnosis not present

## 2023-08-23 DIAGNOSIS — K589 Irritable bowel syndrome without diarrhea: Secondary | ICD-10-CM | POA: Diagnosis not present

## 2023-08-23 DIAGNOSIS — G4700 Insomnia, unspecified: Secondary | ICD-10-CM | POA: Diagnosis not present

## 2023-08-23 DIAGNOSIS — Z978 Presence of other specified devices: Secondary | ICD-10-CM | POA: Diagnosis not present

## 2023-08-23 DIAGNOSIS — N400 Enlarged prostate without lower urinary tract symptoms: Secondary | ICD-10-CM | POA: Diagnosis not present

## 2023-08-23 DIAGNOSIS — F325 Major depressive disorder, single episode, in full remission: Secondary | ICD-10-CM | POA: Diagnosis not present

## 2023-08-23 DIAGNOSIS — E7800 Pure hypercholesterolemia, unspecified: Secondary | ICD-10-CM | POA: Diagnosis not present

## 2023-08-23 MED ORDER — ESCITALOPRAM OXALATE 5 MG PO TABS
5.0000 mg | ORAL_TABLET | Freq: Every day | ORAL | 1 refills | Status: DC
  Filled 2023-08-23: qty 30, 30d supply, fill #0
  Filled 2023-09-09: qty 30, 30d supply, fill #1
  Filled ????-??-??: fill #1

## 2023-08-23 NOTE — Progress Notes (Addendum)
 Chief Complaint  Patient presents with   Hospitalization Follow-up    Hospital Follow up    Subjective: Patient is a 78 y.o. male here for follow-up.  He is here with his wife who helps with the history.  The patient was admitted to the hospital on 08/06/2023 for sepsis secondary to E. coli bacteremia, thought to be from cystitis due to indwelling Foley catheter.  He recently had an acute stroke and has been physically debilitated since that time as well.  He has had a Foley catheter in for now around 6 weeks.  Today did not want to change it during the hospitalization.  [facility].  It is quite far for his wife to bring him given his current physical state.  He is weak in both legs.  Physical therapy is coming to his home to work with him.  He is no longer on antibiotics.  Denies any fevers, bleeding, discharge, abdominal pain.  Patient continues to deal with depressed mood secondary to his physical/medical situation.  He is not seeing a therapist/counselor.  He is not currently taking any medication.  No homicidal or suicidal ideation.  No self-medication.  The patient has been having few bowel movements since the hospitalization.  He was given various stool softeners in the hospital which had marginal effect.  He tries to stay hydrated.  Has not tried anything since coming home.  No nausea or vomiting.  Past Medical History:  Diagnosis Date   BPH (benign prostatic hyperplasia)    CTS (carpal tunnel syndrome)    Depression    Diabetes mellitus    Essential hypertension    GERD (gastroesophageal reflux disease)    History of breast cancer    Hypercholesteremia    Neuropathy     Objective: BP 126/78 (BP Location: Left Arm, Patient Position: Sitting)   Pulse 88   Temp 98 F (36.7 C) (Oral)   Resp 16   Ht 6\' 1"  (1.854 m)   Wt 197 lb (89.4 kg)   SpO2 96%   BMI 25.99 kg/m  General: Awake, appears stated age Heart: RRR, 1+  pitting bilateral LE edema tapering at the proximal third of the tibia Lungs: CTAB, no rales, wheezes or rhonchi. No accessory muscle use Neuro: 3/5 hip flexion b/l, 4.5 knee flexion/extension, 4/5 L elbow flexion and b/l ext rotation, 5/5 strength in upper extremities otherwise, grip strength adequate Psych: Age appropriate judgment and insight, flat affect  Assessment and Plan: Foley catheter in place - Plan: Ambulatory referral to Urology, For home use only DME Hospital bed  [place], post-stroke - Plan: Ambulatory referral to Social Work, For home use only DME Hospital bed  Situational depression  Constipation, unspecified constipation type  Will place referral to [facility] urology upstairs as this location is significantly more convenient for his wife.  Patient is in a wheelchair.  He depends on his wife to help with transfers and transportation. We will place an order for a hospital bed to help his wife with transfers and offloading to prevent ulcers. Due to sequelae of stroke limiting his mobility, he will require frequent turns to prevent pressure sores/ulcers.  He also has a hx of CHF w preserved EF and maintaining elevation of the [facility] will be helpful for his fluid status.  We will involve the social work team to help with the situation as well.  Appreciate physical therapy working with him. Chronic, not controlled.  Counseling information provided  in his AVS.  Counseled on exercise.  Start Lexapro  5 mg daily.  Follow-up in 5 weeks to recheck this. Stay hydrated.  Cocktail of 2 tablespoons of milk of magnesia mixed with warm prune juice, 4 ounces, recommended.  MiraLAX  versus fiber supplementation and alternative.  Consider a suppository or enema if none of these seem to be helpful. The patient and his spouse voiced understanding and agreement to the plan.  I spent 48 minutes with the patient and his wife discussing the above plans in addition to reviewing his chart on the same day of the  visit.  Shellie Dials [place], DO 08/23/23  4:29 PM

## 2023-08-23 NOTE — Patient Instructions (Addendum)
 Please consider counseling. Contact [phone] to schedule an appointment or inquire about cost/insurance coverage.  [facility] located at [address], [address], [place], [place].  Phone number = [phone].  Dr. Dewight Fore - Adult Psychiatry.    [facility] located at [address] [place], [place], [place]. Phone number = [phone].   [facility] located at [address], [place], [place].  Phone number = [phone].   [facility] located at [address] [place], [place], [place].  Phone number = [phone].  Consider melatonin 3-5 mg nightly to help with sleep.   Try 2 tablespoons of milk of mag in 4 oz of warm prune juice. Do that and wait a couple hours. If no improvement, try a Dulcolax suppository and then let me know if we are still having issues.   Stay hydrated.   Let us  know if you need anything.

## 2023-08-25 ENCOUNTER — Telehealth: Payer: Self-pay

## 2023-08-25 DIAGNOSIS — Z950 Presence of cardiac pacemaker: Secondary | ICD-10-CM | POA: Diagnosis not present

## 2023-08-25 DIAGNOSIS — E7800 Pure hypercholesterolemia, unspecified: Secondary | ICD-10-CM | POA: Diagnosis not present

## 2023-08-25 DIAGNOSIS — H43819 Vitreous degeneration, unspecified eye: Secondary | ICD-10-CM | POA: Diagnosis not present

## 2023-08-25 DIAGNOSIS — K219 Gastro-esophageal reflux disease without esophagitis: Secondary | ICD-10-CM | POA: Diagnosis not present

## 2023-08-25 DIAGNOSIS — Z853 Personal history of malignant neoplasm of breast: Secondary | ICD-10-CM | POA: Diagnosis not present

## 2023-08-25 DIAGNOSIS — Z7984 Long term (current) use of oral hypoglycemic drugs: Secondary | ICD-10-CM | POA: Diagnosis not present

## 2023-08-25 DIAGNOSIS — E1142 Type 2 diabetes mellitus with diabetic polyneuropathy: Secondary | ICD-10-CM | POA: Diagnosis not present

## 2023-08-25 DIAGNOSIS — I129 Hypertensive chronic kidney disease with stage 1 through stage 4 chronic kidney disease, or unspecified chronic kidney disease: Secondary | ICD-10-CM | POA: Diagnosis not present

## 2023-08-25 DIAGNOSIS — E1122 Type 2 diabetes mellitus with diabetic chronic kidney disease: Secondary | ICD-10-CM | POA: Diagnosis not present

## 2023-08-25 DIAGNOSIS — E43 Unspecified severe protein-calorie malnutrition: Secondary | ICD-10-CM | POA: Diagnosis not present

## 2023-08-25 DIAGNOSIS — D631 Anemia in chronic kidney disease: Secondary | ICD-10-CM | POA: Diagnosis not present

## 2023-08-25 DIAGNOSIS — I959 Hypotension, unspecified: Secondary | ICD-10-CM | POA: Diagnosis not present

## 2023-08-25 DIAGNOSIS — N189 Chronic kidney disease, unspecified: Secondary | ICD-10-CM | POA: Diagnosis not present

## 2023-08-25 DIAGNOSIS — Z8616 Personal history of COVID-19: Secondary | ICD-10-CM | POA: Diagnosis not present

## 2023-08-25 DIAGNOSIS — G5600 Carpal tunnel syndrome, unspecified upper limb: Secondary | ICD-10-CM | POA: Diagnosis not present

## 2023-08-25 DIAGNOSIS — N400 Enlarged prostate without lower urinary tract symptoms: Secondary | ICD-10-CM | POA: Diagnosis not present

## 2023-08-25 DIAGNOSIS — E860 Dehydration: Secondary | ICD-10-CM | POA: Diagnosis not present

## 2023-08-25 DIAGNOSIS — Z9181 History of falling: Secondary | ICD-10-CM | POA: Diagnosis not present

## 2023-08-25 DIAGNOSIS — K589 Irritable bowel syndrome without diarrhea: Secondary | ICD-10-CM | POA: Diagnosis not present

## 2023-08-25 DIAGNOSIS — G4700 Insomnia, unspecified: Secondary | ICD-10-CM | POA: Diagnosis not present

## 2023-08-25 DIAGNOSIS — F325 Major depressive disorder, single episode, in full remission: Secondary | ICD-10-CM | POA: Diagnosis not present

## 2023-08-25 DIAGNOSIS — E66811 Obesity, class 1: Secondary | ICD-10-CM | POA: Diagnosis not present

## 2023-08-25 NOTE — Telephone Encounter (Signed)
 OK to start Lexapro  now. Take 1 tab of Wellbutrin  daily for 2 weeks and then stop.

## 2023-08-25 NOTE — Telephone Encounter (Signed)
 Received the following message on Pt's wife [name]'s mychart. See below.   [name] H Arganbright to [facility] (supporting Shellie Dials [place], DO) (Selected Message)     08/25/23  8:23 AM Yesterday when we switched Aron Lard to Lexapro  from Bupropion  I thought he was getting one Bupropion  a day. In fact it is 2 a day. (5mg ). S Could I switch to Lexapro  without weaning him off Bupropion ?

## 2023-08-26 ENCOUNTER — Telehealth: Payer: Self-pay

## 2023-08-26 IMAGING — US US ABDOMEN LIMITED
1 series · 14 of 25 positions shown · non-contrast
Comparison: Abdominal ultrasound 10/24/2019.  CT 08/29/2020

CLINICAL DATA: Elevated AST.

EXAM:
ULTRASOUND ABDOMEN LIMITED RIGHT UPPER QUADRANT

[Series 1: us abdomen limited · 14 of 51 slices shown]
[im 1/51]
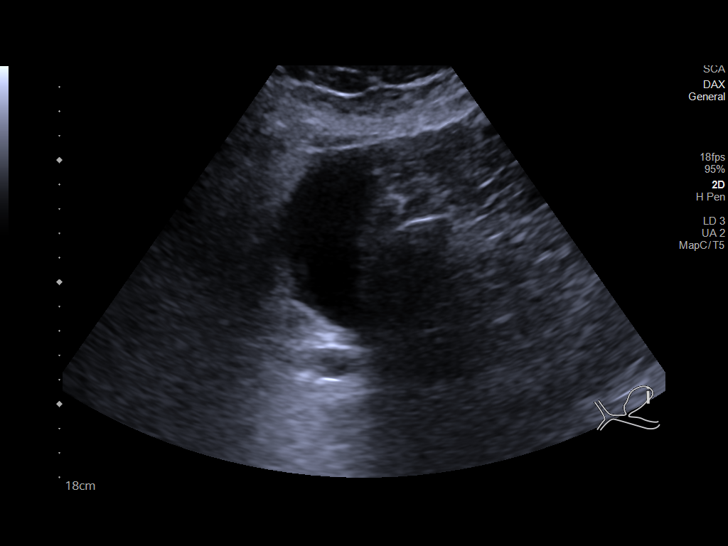
[im 5/51]
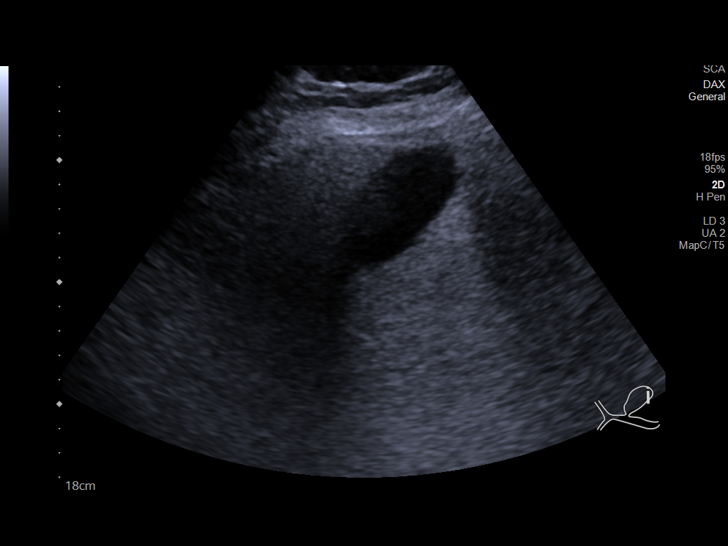
[im 9/51]
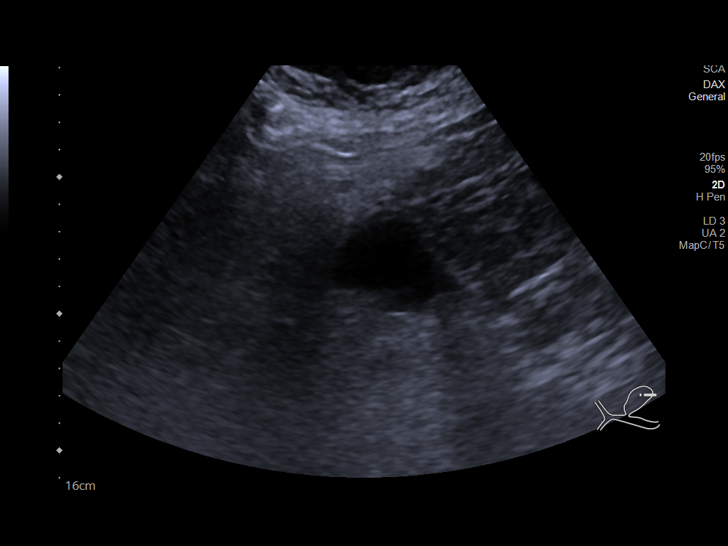
[im 13/51]
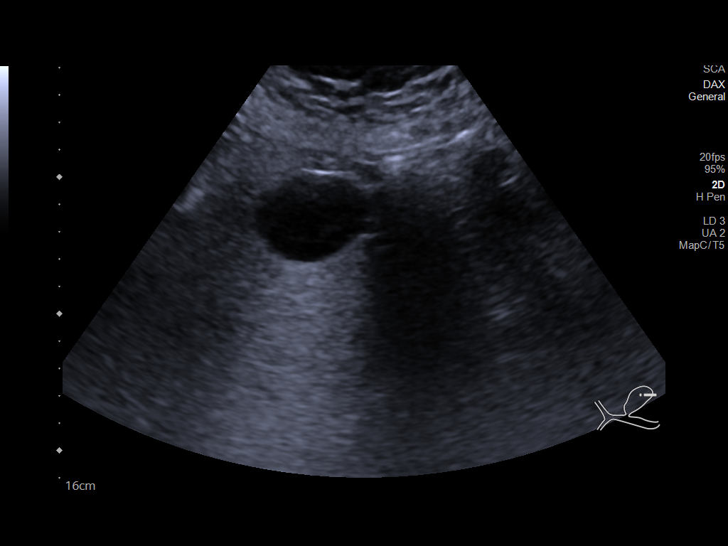
[im 17/51]
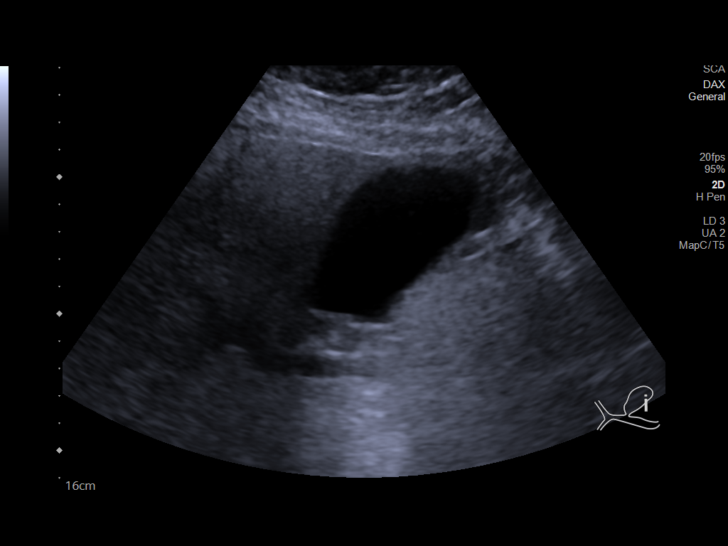
[im 19/51]
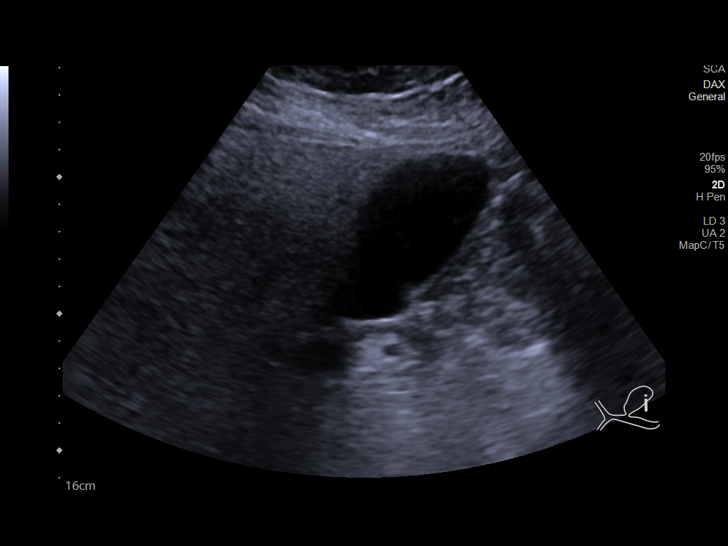
[im 23/51]
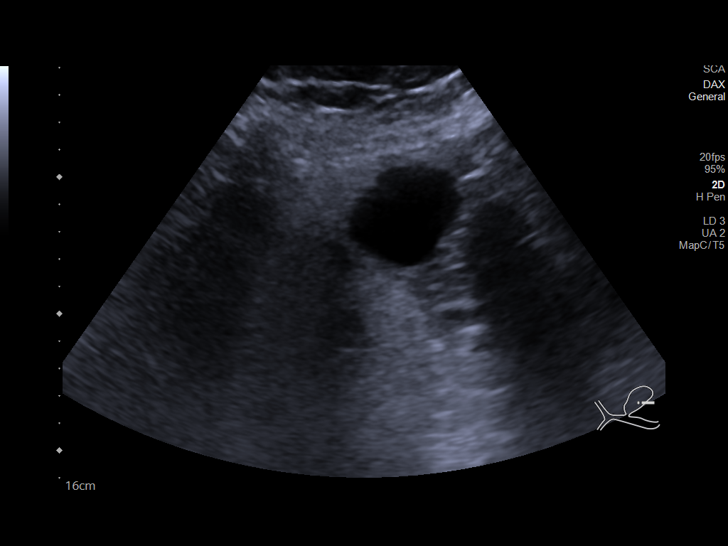
[im 28/51]
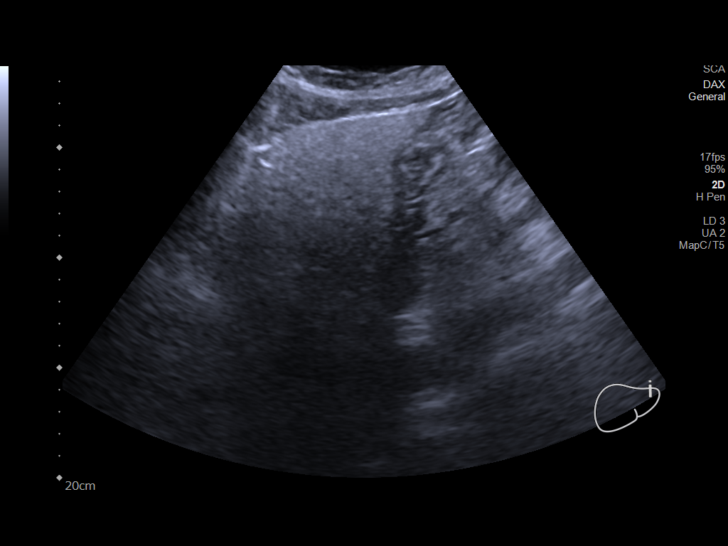
[im 32/51]
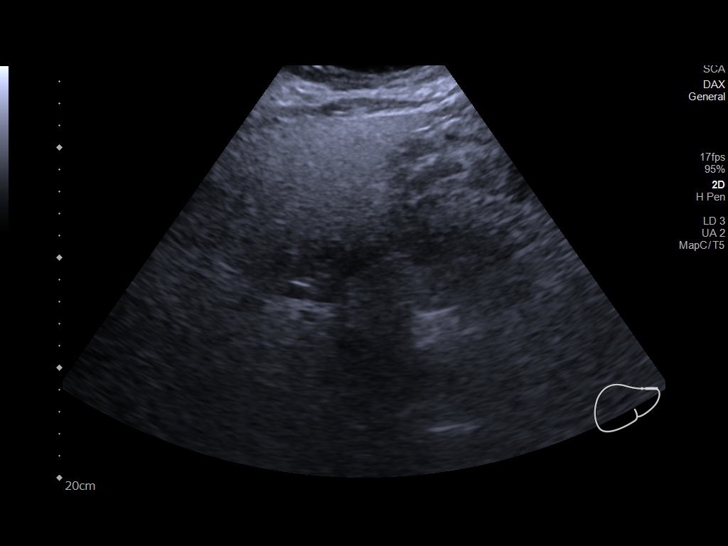
[im 34/51]
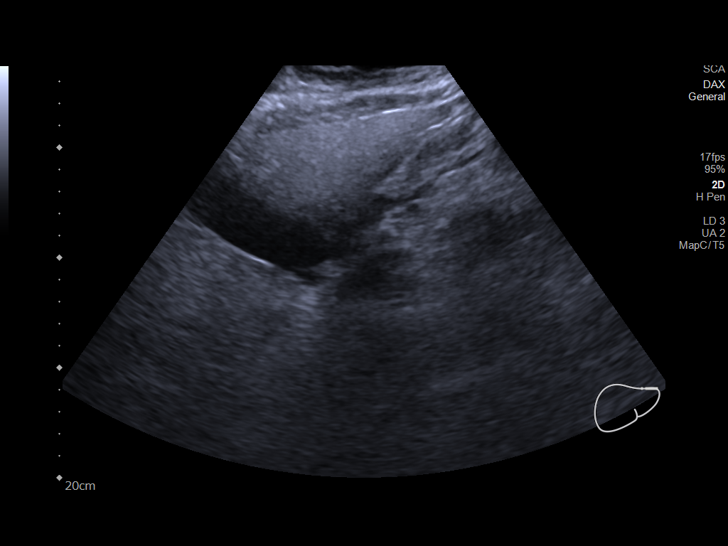
[im 38/51]
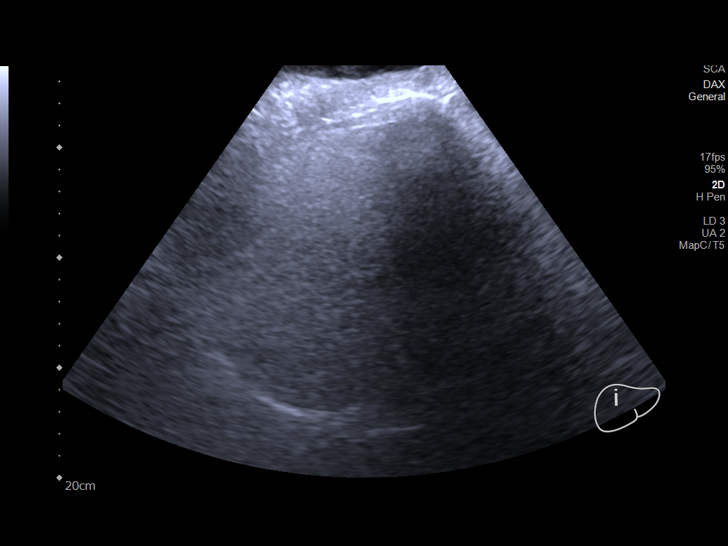
[im 42/51]
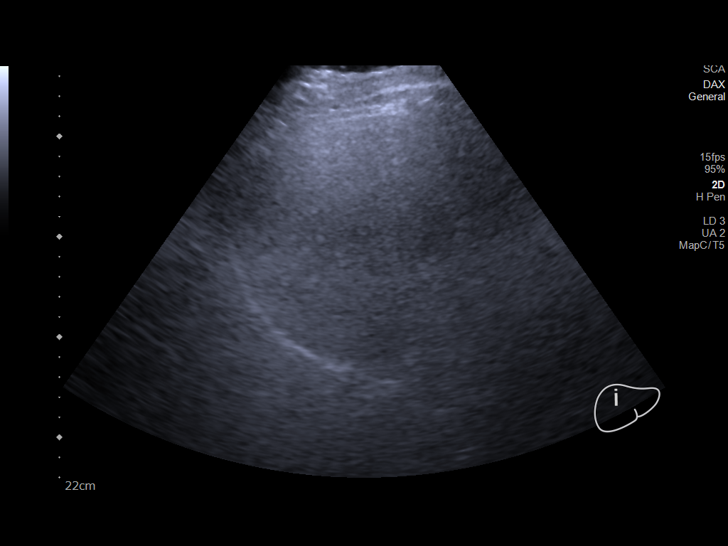
[im 46/51]
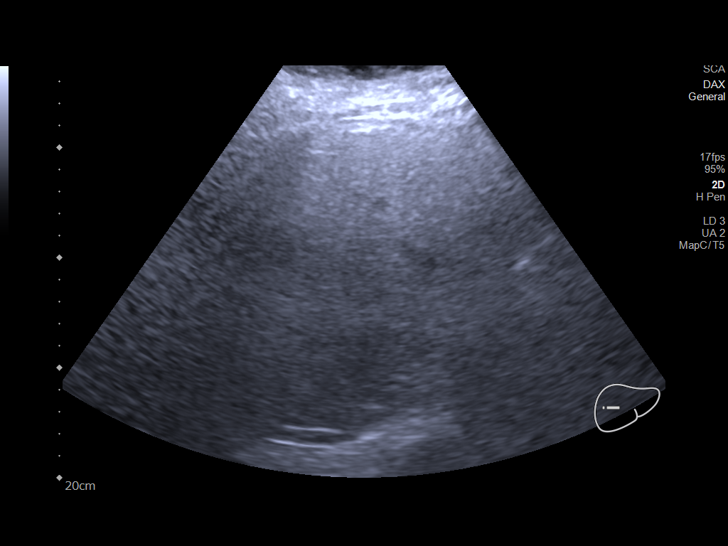
[im 51/51]
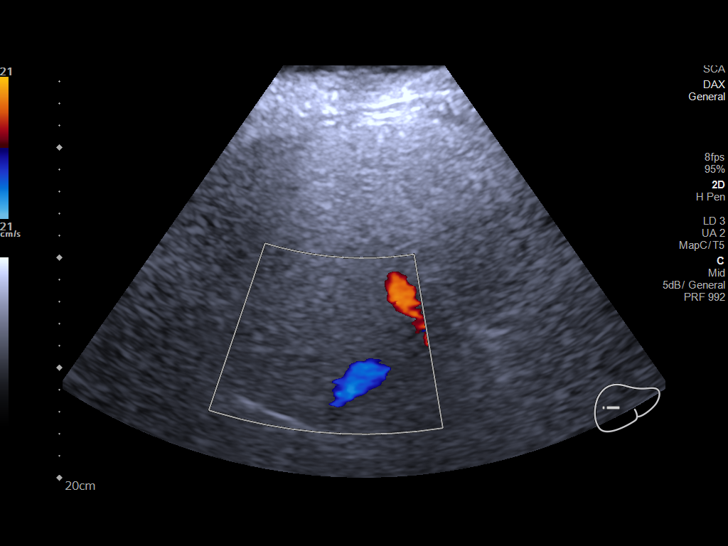

[14 of 25 positions shown; findings below may reference images not displayed]

FINDINGS: Gallbladder:

Physiologically distended. The gallstones on prior CT are not
demonstrated by ultrasound. There is no gallbladder wall thickening
or gallbladder polyp. No sonographic Murphy sign noted by
sonographer.

Common bile duct:

Diameter: 5 mm, normal.

Liver:

Diffusely increased and heterogeneous in parenchymal echogenicity.
Coarsened echotexture. The liver parenchyma is difficult to
penetrate, allowing for this, no discrete focal lesion. There is no
convincing capsular nodularity. Portal vein is patent on color
Doppler imaging with normal direction of blood flow towards the
liver.

Other: No right upper quadrant ascites.
IMPRESSION: 1. Prominent hepatic steatosis.
2. No biliary dilatation. The suspected gallstones on prior CT are
not seen by ultrasound.

## 2023-08-26 NOTE — Progress Notes (Signed)
 Complex Care Management Note  Care Guide Note 08/26/2023 Name: John [name] MRN: [id number] DOB: 1945/09/11  John [name] is a 78 y.o. year old male who sees Gwenette Lennox, Shellie Dials, DO for primary care. I reached out to Rogerio Clay by phone today to offer complex care management services.  [name] was given information about Complex Care Management services today including:   The Complex Care Management services include support from the care team which includes your Nurse Care Manager, Clinical Social Worker, or Pharmacist.  The Complex Care Management team is here to help remove barriers to the health concerns and goals most important to you. Complex Care Management services are voluntary, and the patient may decline or stop services at any time by request to their care team member.   Complex Care Management Consent Status: Patient agreed to services and verbal consent obtained.   Follow up plan:  Telephone appointment with complex care management team member scheduled for:  09/06/23 at 2:30 p.m.  Encounter Outcome:  Patient Scheduled  [facility]  [facility] Care Management Assistant Direct Dial : [phone]  Fax: [phone]

## 2023-08-29 ENCOUNTER — Other Ambulatory Visit: Payer: Self-pay | Admitting: Pharmacist

## 2023-08-29 ENCOUNTER — Other Ambulatory Visit (HOSPITAL_BASED_OUTPATIENT_CLINIC_OR_DEPARTMENT_OTHER): Payer: Self-pay

## 2023-08-29 ENCOUNTER — Encounter: Payer: Self-pay | Admitting: Pharmacist

## 2023-08-29 MED ORDER — ATORVASTATIN CALCIUM 80 MG PO TABS
80.0000 mg | ORAL_TABLET | Freq: Every day | ORAL | 1 refills | Status: DC
  Filled 2023-08-29 – 2023-09-09 (×2): qty 90, 90d supply, fill #0

## 2023-08-29 MED ORDER — APIXABAN 5 MG PO TABS
5.0000 mg | ORAL_TABLET | Freq: Two times a day (BID) | ORAL | 0 refills | Status: DC
  Filled 2023-08-29 – 2023-09-09 (×2): qty 60, 30d supply, fill #0

## 2023-08-29 MED ORDER — METFORMIN HCL 500 MG PO TABS
500.0000 mg | ORAL_TABLET | Freq: Two times a day (BID) | ORAL | 1 refills | Status: DC
  Filled 2023-08-29 – 2023-09-09 (×2): qty 180, 90d supply, fill #0

## 2023-08-29 NOTE — Telephone Encounter (Signed)
 Patient needed updated prescriptions sent to [facility] for metformin  ER 500mg  twice a day, Eliquis  5mg  twice a day and atorvastatin  80mg  daily .  Updated atorvastatin  and metformin  for 90 days + 1 refill.  Eliquis  is prescription for PE treatment - diagnosed 04/2023. It looks like possible treatment was planned for 3 to 6 months. Will check with Dr Gwenette Lennox about Eliquis  Rx - would be due to continue at least until July 2025.

## 2023-08-29 NOTE — Progress Notes (Signed)
 Pharmacy Quality Measure Review  This patient is appearing on a report for being at risk of failing the adherence measure for cholesterol (statin) medications this calendar year.   Medication: atorvastatin   Last fill date: 07/08/2023 for 30 day supply  Patient was in hospital from 08/06/2023 to 08/13/2023. He was then discharged to rehab and stayed there until 08/19/2023.   There were several medications changes during hospitalization and rehab -  Jardiance  and amlodipine  were stopped Initially metformin  was stopped when he left the hospital but restarted at lower dose - metformin  ER 500mg  take 1 tablet twice a day.  Carvedilol  and Spironolactone  25mg  - take 0.5 tablet daily were started at hospital discharge but was stopped during rehab.  Midodrine  5mg  3 times a day started at rehab discharge Hyoscyamine  0.125mg  sublingual as needed up to every 4 hours for bladder spasms stated at rehab. Tamsulosin  0.4mg  discontinued during rehab.  Over-the-counter - ferrous sulfate  325mg  daily and B12 1000mcg daily started at hospital discharge.   Plan:  Spoke with patient's wife and review recent med changes.  Verified he had all needed / prescribed medications on hand at this time.  Insurance report was not up to date. No action needed at this time.  - atrovastatin was filled for 30 days by [facility] pharmacy on 08/17/2023 for #30 tabs.  Coordinated with his regular pharmacy - [facility] at [facility] to make sure he has prescriptions with refills (90 days when appropriate)    Cecilie Coffee, PharmD Clinical Pharmacist [facility]  Population Health [phone]

## 2023-08-30 ENCOUNTER — Ambulatory Visit: Admitting: Nurse Practitioner

## 2023-08-30 ENCOUNTER — Emergency Department (HOSPITAL_COMMUNITY)
Admission: EM | Admit: 2023-08-30 | Discharge: 2023-08-31 | Disposition: A | Discharge status: Home / Self Care | Attending: Emergency Medicine | Admitting: Emergency Medicine

## 2023-08-30 ENCOUNTER — Other Ambulatory Visit: Payer: Self-pay

## 2023-08-30 ENCOUNTER — Emergency Department (HOSPITAL_COMMUNITY)

## 2023-08-30 DIAGNOSIS — I951 Orthostatic hypotension: Secondary | ICD-10-CM | POA: Insufficient documentation

## 2023-08-30 DIAGNOSIS — Z8616 Personal history of COVID-19: Secondary | ICD-10-CM | POA: Diagnosis not present

## 2023-08-30 DIAGNOSIS — F325 Major depressive disorder, single episode, in full remission: Secondary | ICD-10-CM | POA: Diagnosis not present

## 2023-08-30 DIAGNOSIS — W1809XA Striking against other object with subsequent fall, initial encounter: Secondary | ICD-10-CM | POA: Insufficient documentation

## 2023-08-30 DIAGNOSIS — E43 Unspecified severe protein-calorie malnutrition: Secondary | ICD-10-CM | POA: Diagnosis not present

## 2023-08-30 DIAGNOSIS — S0990XA Unspecified injury of head, initial encounter: Secondary | ICD-10-CM | POA: Diagnosis not present

## 2023-08-30 DIAGNOSIS — W19XXXA Unspecified fall, initial encounter: Secondary | ICD-10-CM | POA: Diagnosis not present

## 2023-08-30 DIAGNOSIS — I69354 Hemiplegia and hemiparesis following cerebral infarction affecting left non-dominant side: Secondary | ICD-10-CM | POA: Diagnosis not present

## 2023-08-30 DIAGNOSIS — R42 Dizziness and giddiness: Secondary | ICD-10-CM | POA: Diagnosis not present

## 2023-08-30 DIAGNOSIS — K219 Gastro-esophageal reflux disease without esophagitis: Secondary | ICD-10-CM | POA: Diagnosis not present

## 2023-08-30 DIAGNOSIS — Y92000 Kitchen of unspecified non-institutional (private) residence as  the place of occurrence of the external cause: Secondary | ICD-10-CM | POA: Diagnosis not present

## 2023-08-30 DIAGNOSIS — Z853 Personal history of malignant neoplasm of breast: Secondary | ICD-10-CM | POA: Diagnosis not present

## 2023-08-30 DIAGNOSIS — N400 Enlarged prostate without lower urinary tract symptoms: Secondary | ICD-10-CM | POA: Diagnosis not present

## 2023-08-30 DIAGNOSIS — Z7901 Long term (current) use of anticoagulants: Secondary | ICD-10-CM | POA: Insufficient documentation

## 2023-08-30 DIAGNOSIS — E860 Dehydration: Secondary | ICD-10-CM | POA: Diagnosis not present

## 2023-08-30 DIAGNOSIS — I959 Hypotension, unspecified: Secondary | ICD-10-CM | POA: Diagnosis not present

## 2023-08-30 DIAGNOSIS — G5600 Carpal tunnel syndrome, unspecified upper limb: Secondary | ICD-10-CM | POA: Diagnosis not present

## 2023-08-30 DIAGNOSIS — R296 Repeated falls: Secondary | ICD-10-CM | POA: Diagnosis not present

## 2023-08-30 DIAGNOSIS — E66811 Obesity, class 1: Secondary | ICD-10-CM | POA: Diagnosis not present

## 2023-08-30 DIAGNOSIS — N189 Chronic kidney disease, unspecified: Secondary | ICD-10-CM | POA: Diagnosis not present

## 2023-08-30 DIAGNOSIS — S5002XA Contusion of left elbow, initial encounter: Secondary | ICD-10-CM

## 2023-08-30 DIAGNOSIS — Z6826 Body mass index (BMI) 26.0-26.9, adult: Secondary | ICD-10-CM | POA: Diagnosis not present

## 2023-08-30 DIAGNOSIS — G4700 Insomnia, unspecified: Secondary | ICD-10-CM | POA: Diagnosis not present

## 2023-08-30 DIAGNOSIS — I129 Hypertensive chronic kidney disease with stage 1 through stage 4 chronic kidney disease, or unspecified chronic kidney disease: Secondary | ICD-10-CM | POA: Diagnosis not present

## 2023-08-30 DIAGNOSIS — Z950 Presence of cardiac pacemaker: Secondary | ICD-10-CM | POA: Diagnosis not present

## 2023-08-30 DIAGNOSIS — Z9181 History of falling: Secondary | ICD-10-CM | POA: Diagnosis not present

## 2023-08-30 DIAGNOSIS — Z23 Encounter for immunization: Secondary | ICD-10-CM | POA: Insufficient documentation

## 2023-08-30 DIAGNOSIS — E1122 Type 2 diabetes mellitus with diabetic chronic kidney disease: Secondary | ICD-10-CM | POA: Diagnosis not present

## 2023-08-30 DIAGNOSIS — K589 Irritable bowel syndrome without diarrhea: Secondary | ICD-10-CM | POA: Diagnosis not present

## 2023-08-30 DIAGNOSIS — S51012A Laceration without foreign body of left elbow, initial encounter: Secondary | ICD-10-CM | POA: Diagnosis not present

## 2023-08-30 DIAGNOSIS — H43819 Vitreous degeneration, unspecified eye: Secondary | ICD-10-CM | POA: Diagnosis not present

## 2023-08-30 DIAGNOSIS — Z7984 Long term (current) use of oral hypoglycemic drugs: Secondary | ICD-10-CM | POA: Diagnosis not present

## 2023-08-30 DIAGNOSIS — D631 Anemia in chronic kidney disease: Secondary | ICD-10-CM | POA: Diagnosis not present

## 2023-08-30 DIAGNOSIS — I6782 Cerebral ischemia: Secondary | ICD-10-CM | POA: Diagnosis not present

## 2023-08-30 DIAGNOSIS — E1142 Type 2 diabetes mellitus with diabetic polyneuropathy: Secondary | ICD-10-CM | POA: Diagnosis not present

## 2023-08-30 DIAGNOSIS — E7800 Pure hypercholesterolemia, unspecified: Secondary | ICD-10-CM | POA: Diagnosis not present

## 2023-08-30 LAB — CBC WITH DIFFERENTIAL/PLATELET
Abs Immature Granulocytes: 0.03 10*3/uL (ref 0.00–0.07)
Basophils Absolute: 0 10*3/uL (ref 0.0–0.1)
Basophils Relative: 0 %
Eosinophils Absolute: 0.1 10*3/uL (ref 0.0–0.5)
Eosinophils Relative: 1 %
HCT: 34.6 % — ABNORMAL LOW (ref 39.0–52.0)
Hemoglobin: 10.9 g/dL — ABNORMAL LOW (ref 13.0–17.0)
Immature Granulocytes: 0 %
Lymphocytes Relative: 15 %
Lymphs Abs: 1.2 10*3/uL (ref 0.7–4.0)
MCH: 28 pg (ref 26.0–34.0)
MCHC: 31.5 g/dL (ref 30.0–36.0)
MCV: 88.9 fL (ref 80.0–100.0)
Monocytes Absolute: 0.4 10*3/uL (ref 0.1–1.0)
Monocytes Relative: 5 %
Neutro Abs: 6.3 10*3/uL (ref 1.7–7.7)
Neutrophils Relative %: 79 %
Platelets: 265 10*3/uL (ref 150–400)
RBC: 3.89 MIL/uL — ABNORMAL LOW (ref 4.22–5.81)
RDW: 16.3 % — ABNORMAL HIGH (ref 11.5–15.5)
WBC: 8 10*3/uL (ref 4.0–10.5)
nRBC: 0 % (ref 0.0–0.2)

## 2023-08-30 LAB — COMPREHENSIVE METABOLIC PANEL WITH GFR
ALT: 13 U/L (ref 0–44)
AST: 21 U/L (ref 15–41)
Albumin: 3.3 g/dL — ABNORMAL LOW (ref 3.5–5.0)
Alkaline Phosphatase: 51 U/L (ref 38–126)
Anion gap: 13 (ref 5–15)
BUN: 10 mg/dL (ref 8–23)
CO2: 23 mmol/L (ref 22–32)
Calcium: 9.3 mg/dL (ref 8.9–10.3)
Chloride: 100 mmol/L (ref 98–111)
Creatinine, Ser: 0.85 mg/dL (ref 0.61–1.24)
GFR, Estimated: >60 mL/min (ref >60)
Glucose, Bld: 202 mg/dL — ABNORMAL HIGH (ref 70–99)
Potassium: 3.6 mmol/L (ref 3.5–5.1)
Sodium: 136 mmol/L (ref 135–145)
Total Bilirubin: 2.5 mg/dL — ABNORMAL HIGH (ref 0.0–1.2)
Total Protein: 5.8 g/dL — ABNORMAL LOW (ref 6.5–8.1)

## 2023-08-30 LAB — I-STAT CG4 LACTIC ACID, ED
Lactic Acid, Venous: 2.1 mmol/L (ref 0.5–1.9)
Lactic Acid, Venous: 1.8 mmol/L (ref 0.5–1.9)

## 2023-08-30 LAB — TROPONIN I (HIGH SENSITIVITY)
Troponin I (High Sensitivity): 23 ng/L — ABNORMAL HIGH (ref <18)
Troponin I (High Sensitivity): 24 ng/L — ABNORMAL HIGH (ref <18)

## 2023-08-30 LAB — CK: Total CK: 27 U/L — ABNORMAL LOW (ref 49–397)

## 2023-08-30 MED ORDER — VITAMIN B-12 1000 MCG PO TABS
1000.0000 ug | ORAL_TABLET | Freq: Every day | ORAL | Status: DC
  Administered 2023-08-30 – 2023-08-31 (×2): 1000 ug via ORAL
  Filled 2023-08-30 (×2): qty 1

## 2023-08-30 MED ORDER — HYOSCYAMINE SULFATE 0.125 MG SL SUBL: 0.1250 mg | SUBLINGUAL_TABLET | SUBLINGUAL | Status: DC | PRN

## 2023-08-30 MED ORDER — ADULT MULTIVITAMIN W/MINERALS CH
1.0000 | ORAL_TABLET | ORAL | Status: DC
  Filled 2023-08-30: qty 1

## 2023-08-30 MED ORDER — MIDODRINE HCL 5 MG PO TABS
5.0000 mg | ORAL_TABLET | Freq: Three times a day (TID) | ORAL | Status: DC
  Administered 2023-08-31: 5 mg via ORAL
  Filled 2023-08-30: qty 1

## 2023-08-30 MED ORDER — TETANUS-DIPHTH-ACELL PERTUSSIS 5-2.5-18.5 LF-MCG/0.5 IM SUSY
0.5000 mL | PREFILLED_SYRINGE | Freq: Once | INTRAMUSCULAR | Status: AC
  Administered 2023-08-30: 0.5 mL via INTRAMUSCULAR
  Filled 2023-08-30: qty 0.5

## 2023-08-30 MED ORDER — METFORMIN HCL 500 MG PO TABS
500.0000 mg | ORAL_TABLET | Freq: Two times a day (BID) | ORAL | Status: DC
  Administered 2023-08-31: 500 mg via ORAL
  Filled 2023-08-30: qty 1

## 2023-08-30 MED ORDER — SODIUM CHLORIDE 0.9 % IV BOLUS
1000.0000 mL | Freq: Once | INTRAVENOUS | Status: AC
  Administered 2023-08-30: 1000 mL via INTRAVENOUS

## 2023-08-30 MED ORDER — SODIUM CHLORIDE 0.9 % IV BOLUS
500.0000 mL | Freq: Once | INTRAVENOUS | Status: AC
  Administered 2023-08-30: 500 mL via INTRAVENOUS

## 2023-08-30 MED ORDER — PANTOPRAZOLE SODIUM 40 MG PO TBEC
40.0000 mg | DELAYED_RELEASE_TABLET | Freq: Every day | ORAL | Status: DC
  Administered 2023-08-30: 40 mg via ORAL
  Filled 2023-08-30: qty 1

## 2023-08-30 MED ORDER — ACETAMINOPHEN ER 650 MG PO TBCR: 650.0000 mg | EXTENDED_RELEASE_TABLET | Freq: Three times a day (TID) | ORAL | Status: DC | PRN

## 2023-08-30 MED ORDER — LOPERAMIDE HCL 2 MG PO CAPS: 2.0000 mg | ORAL_CAPSULE | ORAL | Status: DC | PRN

## 2023-08-30 MED ORDER — APIXABAN 5 MG PO TABS
5.0000 mg | ORAL_TABLET | Freq: Two times a day (BID) | ORAL | Status: DC
  Administered 2023-08-30 – 2023-08-31 (×2): 5 mg via ORAL
  Filled 2023-08-30 (×2): qty 1

## 2023-08-30 MED ORDER — ACETAMINOPHEN 325 MG PO TABS: 650.0000 mg | ORAL_TABLET | Freq: Four times a day (QID) | ORAL | Status: DC | PRN

## 2023-08-30 MED ORDER — FINASTERIDE 5 MG PO TABS
5.0000 mg | ORAL_TABLET | Freq: Every day | ORAL | Status: DC
  Administered 2023-08-30 – 2023-08-31 (×2): 5 mg via ORAL
  Filled 2023-08-30 (×2): qty 1

## 2023-08-30 MED ORDER — ATORVASTATIN CALCIUM 80 MG PO TABS
80.0000 mg | ORAL_TABLET | Freq: Every day | ORAL | Status: DC
  Administered 2023-08-30: 80 mg via ORAL
  Filled 2023-08-30: qty 1

## 2023-08-30 MED ORDER — CLOPIDOGREL BISULFATE 75 MG PO TABS
75.0000 mg | ORAL_TABLET | Freq: Every day | ORAL | Status: DC
  Administered 2023-08-31: 75 mg via ORAL
  Filled 2023-08-30: qty 1

## 2023-08-30 MED ORDER — FERROUS SULFATE 325 (65 FE) MG PO TABS
325.0000 mg | ORAL_TABLET | Freq: Every day | ORAL | Status: DC
  Administered 2023-08-31: 325 mg via ORAL
  Filled 2023-08-30: qty 1

## 2023-08-30 NOTE — ED Notes (Signed)
 MD Randal Bury advised urine not needed

## 2023-08-30 NOTE — Care Management (Signed)
 ED RNCM met with patient at bedside, to discuss transitional care planning.  Patient states, he is not interested in a SNF. He reports just being discharged from [facility] inpatient Rehab. His goal is to return home to continue with HHPT.   Patient is active with [facility] as per [place].  Patient lives at home with wife. ED RNCM updated EDP that patient desire is to return home with Home Health resumption orders requested. Patient reports wife not being able to drive after dark. Will handoff to daytime TOC RNCM.

## 2023-08-30 NOTE — ED Provider Notes (Addendum)
  Physical Exam  BP 131/64   Pulse 64   Temp 98.2 F (36.8 C) (Oral)   Resp 18   SpO2 98%   Physical Exam Vitals and nursing note reviewed.  HENT:     Head: Normocephalic and atraumatic.  Eyes:     Pupils: Pupils are equal, round, and reactive to light.  Cardiovascular:     Rate and Rhythm: Normal rate and regular rhythm.  Pulmonary:     Effort: Pulmonary effort is normal.     Breath sounds: Normal breath sounds.  Abdominal:     Palpations: Abdomen is soft.     Tenderness: There is no abdominal tenderness.  Skin:    General: Skin is warm and dry.  Neurological:     Mental Status: He is alert.  Psychiatric:        Mood and Affect: Mood normal.     Procedures  Procedures  ED Course / MDM   Clinical Course as of 08/30/23 2244  Tue Aug 30, 2023  1425 Patient had approximately 30 point drop in his systolic blood pressure going from laying to sitting.  Will continue IV fluids.  Lab work has been fairly unrevealing.  Lactate initially elevated but cleared. [MB]  1500 Patient tells me that his wife thought he might need to go back to rehab.  He does not want to go back to rehab.  She is not here so he is going to try to call her so she can come up and we can figure out what is going on with this. [MB]  1548 Wife is here saying he is fallen 7 times since discharge.  She cannot pick him up.  He does not listen and does not use his walker.  She is very frustrated with him. [MB]  1820 After long discussion with the patient and his wife at bedside, we will hold here in the ED for [facility] PT evaluation and look for options for rehab/skilled nursing facility [MP]  2243 Patient has been evaluated by [facility] nurse, Earley Glee, here in the ED.  Based upon her assessment, patient would not meet criteria to go back to [facility] rehab, this is the only nursing home he is willing to go to.  He already has home health PT set up.  Plan for discharge back home tomorrow as his wife is gone and she does not drive at  night [MP]    Clinical Course User Index [MB] Butler, Hyland Mollenkopf C, MD [MP] Sallyanne Creamer, DO   Medical Decision Making I, Rafael Bun DO, have assumed care of this patient from the previous provider pending orthostatic vital signs and reevaluation once patient's wife is here to discuss discharge home versus keep here for [facility] evaluation for potential rehab/SNF placement  Amount and/or Complexity of Data Reviewed Labs: ordered. Radiology: ordered.  Risk OTC drugs. Prescription drug management.          Sallyanne Creamer, DO 08/30/23 1823    Sallyanne Creamer, DO 08/30/23 2244

## 2023-08-30 NOTE — ED Notes (Signed)
 Pt transported to CT ?

## 2023-08-30 NOTE — ED Triage Notes (Signed)
 EMS from home with c/o mechanical fall "Feet got tangled up beneath me, and I fell" Pt hit left arm with noticeable skin tear.  Pt presented to EMS with orthostatic hypotension, 70/40 sbp upon sitting.  Last bp 150/70, 60's, bgl 214, +thinners   Hx: Pacemaker Orthohypotention Diabetes

## 2023-08-30 NOTE — ED Notes (Signed)
 Pt given sandwich and crackers with peanut butter

## 2023-08-30 NOTE — TOC CM/SW Note (Signed)
 SW spoke with patient at bedside regarding discharge plans, patient stated he does not want to go to inpatient rehab here at [facility] nor a SNF he wants to go home. Patient has talked to his wife [name], the plan is to go home and continue HHPT.   Aaron AasWinfield Hau, MSW, LCSWA Transition of Care  Clinical Social Worker (ED 3-11 Mon-Fri)  [phone]

## 2023-08-30 NOTE — ED Provider Notes (Signed)
 [place] EMERGENCY DEPARTMENT AT [facility] Provider Note   CSN: [id number] Arrival date & time: 08/30/23  1014     History  No chief complaint on file.   John [name] is a 79 y.o. male.  He is brought in by EMS after a fall at home.  He said he was in the kitchen getting breakfast with he started feeling dizzy lightheaded.  Tried to walk to his walker but got tangled up with his feet and fell.  Was on the ground for a few hours before his wife found him.  EMS said he was hypotensive when they sat him up 70/40.  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  He has a chronic Foley.  His only complaint is some mild discomfort of his left palm that he struck to the floor.  Has a small skin tear there.  No chest pain or shortness of breath denies hitting his head.  No head or neck pain, leg pain or back pain.  States he has been eating and drinking well.  The history is provided by the patient and the EMS personnel.  Fall This is a new problem. The current episode started 1 to 2 hours ago. The problem has been resolved. Pertinent negatives include no chest pain, no abdominal pain, no headaches and no shortness of breath. Nothing aggravates the symptoms. Nothing relieves the symptoms. He has tried nothing for the symptoms. The treatment provided no relief.       Home Medications Prior to Admission medications   Medication Sig Start Date End Date Taking? Authorizing Provider  acetaminophen  (TYLENOL ) 650 MG CR tablet Take 1 tablet (650 mg total) by mouth every 8 (eight) hours as needed for pain. 05/14/23   Zhao, Xika, NP  apixaban  (ELIQUIS ) 5 MG TABS tablet Take 1 tablet (5 mg total) by mouth 2 (two) times daily. 08/29/23   Jobe Mulder, DO  Ascorbic Acid  (VITAMIN C) 1000 MG tablet Take 1,000 mg by mouth 3 (three) times a  week.    [provider]  atorvastatin  (LIPITOR ) 80 MG tablet Take 1 tablet (80 mg total) by mouth at bedtime. 08/29/23   Jobe Mulder, DO  clopidogrel  (PLAVIX ) 75 MG tablet Take 1 tablet (75 mg total) by mouth daily with breakfast. 08/18/23   Angiulli, Everlyn Hockey, PA-C  clotrimazole -betamethasone  (LOTRISONE ) cream Apply 1 Application topically daily. 07/11/23   Jobe Mulder, DO  cyanocobalamin  1000 MCG tablet Take 1 tablet (1,000 mcg total) by mouth daily. 08/17/23   Angiulli, Everlyn Hockey, PA-C  escitalopram  (LEXAPRO ) 5 MG tablet Take 1 tablet (5 mg total) by mouth daily. 08/23/23   Jobe Mulder, DO  ferrous sulfate  325 (65 FE) MG tablet Take 1 tablet (325 mg total) by mouth daily with breakfast. 08/17/23   Angiulli, Everlyn Hockey, PA-C  finasteride  (PROSCAR ) 5 MG tablet Take 1 tablet (5 mg total) by mouth daily. 08/18/23   Angiulli, Everlyn Hockey, PA-C  hyoscyamine  (LEVSIN SL) 0.125 MG SL tablet Place 1 tablet (0.125 mg total) under the tongue every 4 (four) hours as needed for bladder spasms. 08/17/23   Angiulli, Everlyn Hockey, PA-C  loperamide  (IMODIUM ) 2 MG capsule Take 1 capsule (2 mg total) by mouth as needed for diarrhea or loose stools. 08/18/23   Love, Renay Carota, PA-C  metFORMIN  (GLUCOPHAGE ) 500  MG tablet Take 1 tablet (500 mg total) by mouth 2 (two) times daily with a meal. 08/29/23   Wendling, Shellie Dials, DO  midodrine  (PROAMATINE ) 5 MG tablet Take 1 tablet (5 mg total) by mouth 3 (three) times daily with meals. 08/18/23   Angiulli, Daniel J, PA-C  Multiple Vitamin (MULTIVITAMIN) tablet Take 1 tablet by mouth 2 (two) times a week.    [provider]  nystatin  (MYCOSTATIN /NYSTOP ) powder Apply topically 3 (three) times daily. 08/18/23   Love, Renay Carota, PA-C  pantoprazole  (PROTONIX ) 40 MG tablet Take 1 tablet (40 mg total) by mouth at bedtime. 08/17/23   Angiulli, Everlyn Hockey, PA-C  amLODipine  (NORVASC ) 2.5 MG tablet Take 1 tablet (2.5 mg total) by mouth daily. 06/15/23 06/30/23   Jobe Mulder, DO      Allergies    Ramipril, Other, Sertraline, and Adhesive [tape]    Review of Systems   Review of Systems  Constitutional:  Negative for fever.  Eyes:  Negative for visual disturbance.  Respiratory:  Negative for shortness of breath.   Cardiovascular:  Negative for chest pain.  Gastrointestinal:  Negative for abdominal pain.  Skin:  Positive for wound.  Neurological:  Positive for dizziness and light-headedness. Negative for headaches.    Physical Exam Updated Vital Signs BP 138/65 (BP Location: Right Arm)   Pulse 63   Temp 98.1 F (36.7 C) (Oral)   Resp 19   SpO2 98%  Physical Exam Vitals and nursing note reviewed.  Constitutional:      General: He is not in acute distress.    Appearance: Normal appearance. He is well-developed.  HENT:     Head: Normocephalic and atraumatic.  Eyes:     Conjunctiva/sclera: Conjunctivae normal.  Cardiovascular:     Rate and Rhythm: Normal rate and regular rhythm.     Heart sounds: No murmur heard. Pulmonary:     Effort: Pulmonary effort is normal. No respiratory distress.     Breath sounds: Normal breath sounds.  Abdominal:     Palpations: Abdomen is soft.     Tenderness: There is no abdominal tenderness.  Musculoskeletal:        General: Signs of injury present. No deformity.     Cervical back: Neck supple.     Comments: He has full range of motion in his upper and lower extremities.  There is a small skin tear left elbow.  Skin:    General: Skin is warm and dry.     Capillary Refill: Capillary refill takes less than 2 seconds.  Neurological:     General: No focal deficit present.     Mental Status: He is alert and oriented to person, place, and time.     Cranial Nerves: No cranial nerve deficit.     Sensory: No sensory deficit.     Motor: No weakness.     ED Results / Procedures / Treatments   Labs (all labs ordered are listed, but only abnormal results are displayed) Labs Reviewed   COMPREHENSIVE METABOLIC PANEL WITH GFR - Abnormal; Notable for the following components:      Result Value   Glucose, Bld 202 (*)    Total Protein 5.8 (*)    Albumin 3.3 (*)    Total Bilirubin 2.5 (*)    All other components within normal limits  CBC WITH DIFFERENTIAL/PLATELET - Abnormal; Notable for the following components:   RBC 3.89 (*)    Hemoglobin 10.9 (*)    HCT 34.6 (*)  RDW 16.3 (*)    All other components within normal limits  CK - Abnormal; Notable for the following components:   Total CK 27 (*)    All other components within normal limits  I-STAT CG4 LACTIC ACID, ED - Abnormal; Notable for the following components:   Lactic Acid, Venous 2.1 (*)    All other components within normal limits  TROPONIN I (HIGH SENSITIVITY) - Abnormal; Notable for the following components:   Troponin I (High Sensitivity) 23 (*)    All other components within normal limits  URINALYSIS, W/ REFLEX TO CULTURE (INFECTION SUSPECTED)  I-STAT CG4 LACTIC ACID, ED  I-STAT CG4 LACTIC ACID, ED  TROPONIN I (HIGH SENSITIVITY)    EKG EKG Interpretation Date/Time:  Tuesday Aug 30 2023 10:20:34 EDT Ventricular Rate:  64 PR Interval:  200 QRS Duration:  154 QT Interval:  504 QTC Calculation: 521 R Axis:   -73  Text Interpretation: Sinus rhythm Right bundle branch block No significant change since prior 5/25 Confirmed by Racheal Buddle [phone]) on 08/30/2023 10:23:26 AM  Radiology CT Head Wo Contrast Result Date: 08/30/2023 CLINICAL DATA:  Head trauma, mechanical fall. EXAM: CT HEAD WITHOUT CONTRAST TECHNIQUE: Contiguous axial images were obtained from the base of the skull through the vertex without intravenous contrast. RADIATION DOSE REDUCTION: This exam was performed according to the departmental dose-optimization program which includes automated exposure control, adjustment of the mA and/or kV according to patient size and/or use of iterative reconstruction technique. COMPARISON:  CT head  08/10/2023. FINDINGS: Brain: No acute intracranial hemorrhage. No CT evidence of acute infarct. Nonspecific hypoattenuation in the periventricular and subcortical white matter favored to reflect chronic microvascular ischemic changes. 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 No edema, mass effect, or midline shift. The basilar cisterns are patent. Ventricles: Prominence of the ventricles suggesting underlying parenchymal volume loss. Vascular: Atherosclerotic calcifications of the carotid siphons and intracranial vertebral arteries. No hyperdense vessel. Skull: No acute or aggressive finding. Orbits: Orbits are symmetric. Sinuses: The visualized paranasal sinuses are clear. Other: Mastoid air cells are clear. IMPRESSION: No CT evidence of acute intracranial abnormality. 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 Electronically Signed   By: Denny Flack M.D.   On: 08/30/2023 12:54    Procedures Procedures    Medications Ordered in ED Medications  sodium chloride  0.9 % bolus 500 mL (0 mLs Intravenous Stopped 08/30/23 1334)  Tdap (BOOSTRIX) injection 0.5 mL (0.5 mLs Intramuscular Given 08/30/23 1227)  sodium chloride  0.9 % bolus 1,000 mL (0 mLs Intravenous Stopped 08/30/23 1532)    ED Course/ Medical Decision Making/ A&P Clinical Course as of 08/30/23 1629  Tue Aug 30, 2023  1425 Patient had approximately 30 point drop in his systolic blood pressure going from laying to sitting.  Will continue IV fluids.  Lab work has been fairly unrevealing.  Lactate initially elevated but cleared. [MB]  1500 Patient tells me that his wife thought he might need to go back to rehab.  He does not want to go back to rehab.  She is not here so he is going to try to call her so she can come up and we can figure out what is going on with this. [MB]  1548 Wife is here saying he is fallen 7 times since discharge.  She cannot pick him up.  He does not listen and does not use his  walker.  She is very frustrated with him. [MB]    Clinical Course User Index [MB]  Tonya Fredrickson, MD                                 Medical Decision Making Amount and/or Complexity of Data Reviewed Labs: ordered. Radiology: ordered.  Risk Prescription drug management.   This patient complains of fall General Weakness; this involves an extensive number of treatment Options and is a complaint that carries with it a high risk of complications and morbidity. The differential includes fracture, contusion, dehydration, metabolic derangement, stroke, bleed, arrhythmia  I ordered, reviewed and interpreted labs, which included CBC with normal white count stable low hemoglobin, chemistries with elevated glucose, troponins mildly elevated, lactate mildly elevated cleared with fluids, CK normal I ordered medication IV fluids tetanus update and reviewed PMP when indicated. I ordered imaging studies which included head CT and I independently    visualized and interpreted imaging which showed no acute findings Additional history obtained from EMS and patient's spouse Previous records obtained and reviewed in epic including recent discharge summary Cardiac monitoring reviewed, sinus rhythm Social determinants considered, no significant barriers Critical Interventions: None  After the interventions stated above, I reevaluated the patient and found patient to be fairly asymptomatic laying in bed Admission and further testing considered, his care signed out to Dr. Ranelle Buys.  Plan will be to repeat orthostatics and ambulate into see if appropriate for discharge.  If not may need admission or further evaluation by TOC and physical therapy for possible placement.  Not sure if he has any days left in therapy though.         Final Clinical Impression(s) / ED Diagnoses Final diagnoses:  Fall, initial encounter  Contusion of left elbow, initial encounter  Orthostatic hypotension    Rx / DC  Orders ED Discharge Orders     None         Tonya Fredrickson, MD 08/30/23 [phone]

## 2023-08-31 NOTE — Discharge Instructions (Signed)
 Please drink plenty of fluids Follow-up with your primary care doctor this week Use your walker whenever you are walking.  Return return if you are having any new or worsening problems

## 2023-08-31 NOTE — Progress Notes (Signed)
 CSW spoke with patient's wife [name] to discuss discharge plan. CSW informed [name] that patient refused SNF and IPR last night and wants to go home with home health services ([facility]). [name] states she will come and get patient shortly.  CSW informed RN of information.  Shepard Dicker, MSW, LCSW Transitions of Care  Clinical Social Worker II [phone]

## 2023-08-31 NOTE — ED Provider Notes (Signed)
  Physical Exam  BP (!) 146/69   Pulse 74   Temp 98 F (36.7 C) (Oral)   Resp 20   SpO2 99%   Physical Exam  Procedures  Procedures  ED Course / MDM   Clinical Course as of 08/31/23 0956  Tue Aug 30, 2023  1425 Patient had approximately 30 point drop in his systolic blood pressure going from laying to sitting.  Will continue IV fluids.  Lab work has been fairly unrevealing.  Lactate initially elevated but cleared. [MB]  1500 Patient tells me that his wife thought he might need to go back to rehab.  He does not want to go back to rehab.  She is not here so he is going to try to call her so she can come up and we can figure out what is going on with this. [MB]  1548 Wife is here saying he is fallen 7 times since discharge.  She cannot pick him up.  He does not listen and does not use his walker.  She is very frustrated with him. [MB]  1820 After long discussion with the patient and his wife at bedside, we will hold here in the ED for [facility] PT evaluation and look for options for rehab/skilled nursing facility [MP]  2243 Patient has been evaluated by [facility] nurse, Earley Glee, here in the ED.  Based upon her assessment, patient would not meet criteria to go back to [facility] rehab, this is the only nursing home he is willing to go to.  He already has home health PT set up.  Plan for discharge back home tomorrow as his wife is gone and she does not drive at night [MP]    Clinical Course User Index [MB] Butler, Michael C, MD [MP] Sallyanne Creamer, DO   Medical Decision Making Amount and/or Complexity of Data Reviewed Labs: ordered. Radiology: ordered.  Risk OTC drugs. Prescription drug management.   Reviewed prior notes.  Patient is for discharge to home this morning. Patient has no complaints.  Vital signs are stable.  His wife is on the way to get him.  Have reviewed the prior notes and patient appears to be stable for discharge       Auston Blush, MD 08/31/23 [phone]

## 2023-08-31 NOTE — ED Notes (Signed)
 RN called [name]- son, [name] spouse did not answer for pickup. [name] is notified of plan and will come pick pt up

## 2023-08-31 NOTE — Evaluation (Signed)
 Physical Therapy Evaluation Patient Details Name: John [name] MRN: [id number] DOB: 1945-05-30 Today's Date: 08/31/2023  History of Present Illness  78 y.o. male presents to [facility] 08/30/23 after falling at home due to feeling dizzy/lightheaded. Unable to get up off the ground. Head CT negative. PMH: chronic vertigo, insulin -dependent DM2, HTN, HLD, peripheral neuropathy, breast cancer, carpal tunnel syndrome   Clinical Impression  Pt on stretcher upon arrival and agreeable to PT eval. PTA, pt was ModI with a RW for short distances and had assist from wife for iADLs with supervision for ADLs. Pt has a history of multiple falls and recently returned home from rehab. In today's session, pt required MinA for bed mobility and CGA to stand and ambulate 240 ft with RW. Pending pt continues to have 24/7 physical assist available at home, recommending HHPT. Pt currently with functional limitations due to the deficits listed below (see PT Problem List). Pt would benefit from acute skilled PT to address functional impairments. Acute PT to follow.         If plan is discharge home, recommend the following: A little help with walking and/or transfers;A little help with bathing/dressing/bathroom;Assistance with cooking/housework;Assist for transportation;Help with stairs or ramp for entrance   Can travel by private vehicle    Yes    Equipment Recommendations None recommended by PT     Functional Status Assessment Patient has had a recent decline in their functional status and demonstrates the ability to make significant improvements in function in a reasonable and predictable amount of time.     Precautions / Restrictions Precautions Precautions: Fall Recall of Precautions/Restrictions: Intact Restrictions Weight Bearing Restrictions Per Provider Order: No      Mobility  Bed Mobility Overal bed mobility: Needs Assistance Bed Mobility: Supine to Sit, Sit to Supine     Supine to sit: Min  assist, HOB elevated Sit to supine: Contact guard assist   General bed mobility comments: MinA to assist with raising trunk due to narrow stretcher. CGA for safety for return to supine    Transfers Overall transfer level: Needs assistance Equipment used: Rolling walker (2 wheels) Transfers: Sit to/from Stand Sit to Stand: Contact guard assist      General transfer comment: slow to rise, CGA for safety    Ambulation/Gait Ambulation/Gait assistance: Contact guard assist Gait Distance (Feet): 240 Feet Assistive device: Rolling walker (2 wheels) Gait Pattern/deviations: Trunk flexed, Step-through pattern, Decreased stride length Gait velocity: decreased     General Gait Details: steady gait with CGA for safety. Reported fatigue at end of gait     Balance Overall balance assessment: Needs assistance, History of Falls Sitting-balance support: No upper extremity supported, Feet supported Sitting balance-Leahy Scale: Good     Standing balance support: Bilateral upper extremity supported, During functional activity Standing balance-Leahy Scale: Poor Standing balance comment: reliant on RW       Pertinent Vitals/Pain Pain Assessment Pain Assessment: No/denies pain    Home Living Family/patient expects to be discharged to:: Private residence Living Arrangements: Spouse/significant other Available Help at Discharge: Family;Available 24 hours/day Type of Home: House Home Access: Stairs to enter Entrance Stairs-Rails: Right;Left;Can reach both Entrance Stairs-Number of Steps: 4 Alternate Level Stairs-Number of Steps: 14 Home Layout: Two level;Able to live on main level with bedroom/bathroom Home Equipment: Rolling Walker (2 wheels);BSC/3in1;Shower seat;Lift chair      Prior Function Prior Level of Function : Needs assist;History of Falls (last six months)     Physical Assist : ADLs (physical);Mobility (physical) Mobility (  physical): Gait;Stairs ADLs (physical):  Bathing;Dressing;IADLs Mobility Comments: ModI with RW. Primarily stays on ground floor. At least 6 falls in the past 6 months due to either losing balance or feeling lightheaded/dizzy. Pt reports sleeping in recliner chair ADLs Comments: Independent with self-feeding and grooming. Pt reports bathing, dressing, and toileting with supervision from wife for safety. Pt also reports use of shower chair. Pt's wife assists with IADLs. Pt is a retired [employer] transport manager.     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)    Cervical / Trunk Assessment Cervical / Trunk Assessment: Normal  Communication   Communication Communication: Impaired Factors Affecting Communication: Hearing impaired    Cognition Arousal: Alert Behavior During Therapy: WFL for tasks assessed/performed   PT - Cognitive impairments: No apparent impairments      Following commands: Intact       Cueing Cueing Techniques: Verbal cues     General Comments General comments (skin integrity, edema, etc.): BP stable, see vitals flowsheet for details     PT Assessment Patient needs continued PT services  PT Problem List Decreased strength;Decreased activity tolerance;Decreased balance;Decreased safety awareness;Decreased knowledge of use of DME;Decreased mobility       PT Treatment Interventions DME instruction;Gait training;Stair training;Functional mobility training;Therapeutic activities;Therapeutic exercise;Balance training;Patient/family education    PT Goals (Current goals can be found in the Care Plan section)  Acute Rehab PT Goals Patient Stated Goal: to go home PT Goal Formulation: With patient Time For Goal Achievement: 09/14/23 Potential to Achieve Goals: Good    Frequency Min 2X/week        AM-PAC PT "6  Clicks" Mobility  Outcome Measure Help needed turning from your back to your side while in a flat bed without using bedrails?: None Help needed moving from lying on your back to sitting on the side of a flat bed without using bedrails?: A Little Help needed moving to and from a bed to a chair (including a wheelchair)?: A Little Help needed standing up from a chair using your arms (e.g., wheelchair or bedside chair)?: A Little Help needed to walk in hospital room?: A Little Help needed climbing 3-5 steps with a railing? : A Lot 6 Click Score: 18    End of Session Equipment Utilized During Treatment: Gait belt Activity Tolerance: Patient tolerated treatment well Patient left: in bed;with call bell/phone within reach Nurse Communication: Mobility status PT Visit Diagnosis: Muscle weakness (generalized) (M62.81);Other abnormalities of gait and mobility (R26.89);Unsteadiness on feet (R26.81)    Time: 3086-5784 PT Time Calculation (min) (ACUTE ONLY): 21 min   Charges:   PT Evaluation $PT Eval Low Complexity: 1 Low   PT General Charges $$ ACUTE PT VISIT: 1 Visit        Orysia Blas, PT, DPT Secure Chat Preferred  Rehab Office [phone]   Alissa April Adela Ades 08/31/2023, 9:15 AM

## 2023-09-01 DIAGNOSIS — R338 Other retention of urine: Secondary | ICD-10-CM | POA: Diagnosis not present

## 2023-09-02 ENCOUNTER — Other Ambulatory Visit (HOSPITAL_BASED_OUTPATIENT_CLINIC_OR_DEPARTMENT_OTHER): Payer: Self-pay

## 2023-09-02 ENCOUNTER — Telehealth: Payer: Self-pay

## 2023-09-02 NOTE — Telephone Encounter (Signed)
 Copied from CRM [id number]. Topic: Clinical - Order For Equipment >> Sep 02, 2023  3:54 PM Adaysia C wrote: Reason for CRM: [name](pts wife) received a call from [facility] that they have not received the order for a hospital bed for this pt; please follow up with [name] on the status of the hospital bed order [id number]) [phone])

## 2023-09-02 NOTE — Telephone Encounter (Signed)
 Called pt wife and was advised that hospital bed has been ordered, and message was sent to team that deals with orders for beds. Told pt wife when I get message back, we will let her know of a update.

## 2023-09-03 DIAGNOSIS — E43 Unspecified severe protein-calorie malnutrition: Secondary | ICD-10-CM | POA: Diagnosis not present

## 2023-09-03 DIAGNOSIS — Z950 Presence of cardiac pacemaker: Secondary | ICD-10-CM | POA: Diagnosis not present

## 2023-09-03 DIAGNOSIS — R296 Repeated falls: Secondary | ICD-10-CM | POA: Diagnosis not present

## 2023-09-03 DIAGNOSIS — K589 Irritable bowel syndrome without diarrhea: Secondary | ICD-10-CM | POA: Diagnosis not present

## 2023-09-03 DIAGNOSIS — D631 Anemia in chronic kidney disease: Secondary | ICD-10-CM | POA: Diagnosis not present

## 2023-09-03 DIAGNOSIS — Z7984 Long term (current) use of oral hypoglycemic drugs: Secondary | ICD-10-CM | POA: Diagnosis not present

## 2023-09-03 DIAGNOSIS — H43819 Vitreous degeneration, unspecified eye: Secondary | ICD-10-CM | POA: Diagnosis not present

## 2023-09-03 DIAGNOSIS — Z9181 History of falling: Secondary | ICD-10-CM | POA: Diagnosis not present

## 2023-09-03 DIAGNOSIS — I959 Hypotension, unspecified: Secondary | ICD-10-CM | POA: Diagnosis not present

## 2023-09-03 DIAGNOSIS — I129 Hypertensive chronic kidney disease with stage 1 through stage 4 chronic kidney disease, or unspecified chronic kidney disease: Secondary | ICD-10-CM | POA: Diagnosis not present

## 2023-09-03 DIAGNOSIS — G4700 Insomnia, unspecified: Secondary | ICD-10-CM | POA: Diagnosis not present

## 2023-09-03 DIAGNOSIS — E7800 Pure hypercholesterolemia, unspecified: Secondary | ICD-10-CM | POA: Diagnosis not present

## 2023-09-03 DIAGNOSIS — I69354 Hemiplegia and hemiparesis following cerebral infarction affecting left non-dominant side: Secondary | ICD-10-CM | POA: Diagnosis not present

## 2023-09-03 DIAGNOSIS — Z8616 Personal history of COVID-19: Secondary | ICD-10-CM | POA: Diagnosis not present

## 2023-09-03 DIAGNOSIS — N189 Chronic kidney disease, unspecified: Secondary | ICD-10-CM | POA: Diagnosis not present

## 2023-09-03 DIAGNOSIS — E860 Dehydration: Secondary | ICD-10-CM | POA: Diagnosis not present

## 2023-09-03 DIAGNOSIS — E1122 Type 2 diabetes mellitus with diabetic chronic kidney disease: Secondary | ICD-10-CM | POA: Diagnosis not present

## 2023-09-03 DIAGNOSIS — K219 Gastro-esophageal reflux disease without esophagitis: Secondary | ICD-10-CM | POA: Diagnosis not present

## 2023-09-03 DIAGNOSIS — F325 Major depressive disorder, single episode, in full remission: Secondary | ICD-10-CM | POA: Diagnosis not present

## 2023-09-03 DIAGNOSIS — N400 Enlarged prostate without lower urinary tract symptoms: Secondary | ICD-10-CM | POA: Diagnosis not present

## 2023-09-03 DIAGNOSIS — Z6826 Body mass index (BMI) 26.0-26.9, adult: Secondary | ICD-10-CM | POA: Diagnosis not present

## 2023-09-03 DIAGNOSIS — G5600 Carpal tunnel syndrome, unspecified upper limb: Secondary | ICD-10-CM | POA: Diagnosis not present

## 2023-09-03 DIAGNOSIS — Z853 Personal history of malignant neoplasm of breast: Secondary | ICD-10-CM | POA: Diagnosis not present

## 2023-09-03 DIAGNOSIS — E1142 Type 2 diabetes mellitus with diabetic polyneuropathy: Secondary | ICD-10-CM | POA: Diagnosis not present

## 2023-09-03 DIAGNOSIS — E66811 Obesity, class 1: Secondary | ICD-10-CM | POA: Diagnosis not present

## 2023-09-06 ENCOUNTER — Other Ambulatory Visit: Payer: Self-pay | Admitting: Licensed Clinical Social Worker

## 2023-09-06 ENCOUNTER — Telehealth: Payer: Self-pay

## 2023-09-06 NOTE — Telephone Encounter (Signed)
 Yes

## 2023-09-06 NOTE — Telephone Encounter (Signed)
 Copied from CRM [id number]. Topic: Clinical - Home Health Verbal Orders >> Sep 06, 2023 12:27 PM Adonis Hoot wrote: Caller/Agency: Brittany//[facility] Callback Number: [phone] Service Requested:  Any new concerns about the patient? Yes Drug interaction: Patient is taking eliquis  and plavix ,she would like to know if patient is indeed suppose to be taking both?

## 2023-09-06 NOTE — Telephone Encounter (Signed)
**Note De-identified  [place] Obfuscation** Please advise 

## 2023-09-06 NOTE — Telephone Encounter (Signed)
 Called  Brittany//[facility] verbal order was given  Callback Number: [phone]

## 2023-09-07 DIAGNOSIS — G5600 Carpal tunnel syndrome, unspecified upper limb: Secondary | ICD-10-CM | POA: Diagnosis not present

## 2023-09-07 DIAGNOSIS — Z950 Presence of cardiac pacemaker: Secondary | ICD-10-CM | POA: Diagnosis not present

## 2023-09-07 DIAGNOSIS — E860 Dehydration: Secondary | ICD-10-CM | POA: Diagnosis not present

## 2023-09-07 DIAGNOSIS — E7800 Pure hypercholesterolemia, unspecified: Secondary | ICD-10-CM | POA: Diagnosis not present

## 2023-09-07 DIAGNOSIS — Z7984 Long term (current) use of oral hypoglycemic drugs: Secondary | ICD-10-CM | POA: Diagnosis not present

## 2023-09-07 DIAGNOSIS — Z6826 Body mass index (BMI) 26.0-26.9, adult: Secondary | ICD-10-CM | POA: Diagnosis not present

## 2023-09-07 DIAGNOSIS — R296 Repeated falls: Secondary | ICD-10-CM | POA: Diagnosis not present

## 2023-09-07 DIAGNOSIS — I129 Hypertensive chronic kidney disease with stage 1 through stage 4 chronic kidney disease, or unspecified chronic kidney disease: Secondary | ICD-10-CM | POA: Diagnosis not present

## 2023-09-07 DIAGNOSIS — H43819 Vitreous degeneration, unspecified eye: Secondary | ICD-10-CM | POA: Diagnosis not present

## 2023-09-07 DIAGNOSIS — K219 Gastro-esophageal reflux disease without esophagitis: Secondary | ICD-10-CM | POA: Diagnosis not present

## 2023-09-07 DIAGNOSIS — E1122 Type 2 diabetes mellitus with diabetic chronic kidney disease: Secondary | ICD-10-CM | POA: Diagnosis not present

## 2023-09-07 DIAGNOSIS — G4700 Insomnia, unspecified: Secondary | ICD-10-CM | POA: Diagnosis not present

## 2023-09-07 DIAGNOSIS — N400 Enlarged prostate without lower urinary tract symptoms: Secondary | ICD-10-CM | POA: Diagnosis not present

## 2023-09-07 DIAGNOSIS — K589 Irritable bowel syndrome without diarrhea: Secondary | ICD-10-CM | POA: Diagnosis not present

## 2023-09-07 DIAGNOSIS — Z9181 History of falling: Secondary | ICD-10-CM | POA: Diagnosis not present

## 2023-09-07 DIAGNOSIS — N189 Chronic kidney disease, unspecified: Secondary | ICD-10-CM | POA: Diagnosis not present

## 2023-09-07 DIAGNOSIS — Z8616 Personal history of COVID-19: Secondary | ICD-10-CM | POA: Diagnosis not present

## 2023-09-07 DIAGNOSIS — D631 Anemia in chronic kidney disease: Secondary | ICD-10-CM | POA: Diagnosis not present

## 2023-09-07 DIAGNOSIS — E1142 Type 2 diabetes mellitus with diabetic polyneuropathy: Secondary | ICD-10-CM | POA: Diagnosis not present

## 2023-09-07 DIAGNOSIS — I69354 Hemiplegia and hemiparesis following cerebral infarction affecting left non-dominant side: Secondary | ICD-10-CM | POA: Diagnosis not present

## 2023-09-07 DIAGNOSIS — Z853 Personal history of malignant neoplasm of breast: Secondary | ICD-10-CM | POA: Diagnosis not present

## 2023-09-07 DIAGNOSIS — E43 Unspecified severe protein-calorie malnutrition: Secondary | ICD-10-CM | POA: Diagnosis not present

## 2023-09-07 DIAGNOSIS — F325 Major depressive disorder, single episode, in full remission: Secondary | ICD-10-CM | POA: Diagnosis not present

## 2023-09-07 DIAGNOSIS — I959 Hypotension, unspecified: Secondary | ICD-10-CM | POA: Diagnosis not present

## 2023-09-07 DIAGNOSIS — E66811 Obesity, class 1: Secondary | ICD-10-CM | POA: Diagnosis not present

## 2023-09-07 NOTE — Patient Outreach (Signed)
 Complex Care Management   Visit Note  09/07/2023  Name:  John [name] MRN: [id number] DOB: [birth year]  Situation: Referral received for Complex Care Management related to personal care assistance  I obtained verbal consent from Caregiver.  Visit completed with [name]   on the phone Completed call with pt spouse John [name] advise [facility] has started services in the home. Background:   Past Medical History:  Diagnosis Date   BPH (benign prostatic hyperplasia)    CTS (carpal tunnel syndrome)    Depression    Diabetes mellitus    Essential hypertension    GERD (gastroesophageal reflux disease)    History of breast cancer    Hypercholesteremia    Neuropathy     Assessment: Patient Reported Symptoms:  Cognitive Cognitive Status: Unable to Assess (pt unavailable spk with spouse [name]) Cognitive/Intellectual Conditions Management [RPT]: Not Assessed      Neurological Neurological Review of Symptoms: No symptoms reported Neurological Conditions: Stroke, hemorrhagic  HEENT HEENT Symptoms Reported: No symptoms reported      Cardiovascular Cardiovascular Symptoms Reported: No symptoms reported Cardiovascular Conditions: Hypertension  Respiratory Respiratory Symptoms Reported: No symptoms reported    Endocrine Patient reports the following symptoms related to hypoglycemia or hyperglycemia : No symptoms reported Is patient diabetic?: Yes    Gastrointestinal Gastrointestinal Symptoms Reported: No symptoms reported      Genitourinary Genitourinary Symptoms Reported: No symptoms reported Genitourinary Conditions: Chronic kidney disease  Integumentary Integumentary Symptoms Reported: No symptoms reported    Musculoskeletal Musculoskelatal Symptoms Reviewed: Not assessed        Psychosocial       Quality of Family Relationships: supportive Do you feel physically threatened by others?: No      01/28/2023    2:18 PM  Depression screen PHQ 2/9  Decreased  Interest 0  Down, Depressed, Hopeless 0  PHQ - 2 Score 0    There were no vitals filed for this visit.  Medications Reviewed Today     Reviewed by Fletcher Humble, LCSW (Social Worker) on 09/07/23 at 1633  Med List Status: <None>   Medication Order Taking? Sig Documenting Provider Last Dose Status Informant  acetaminophen  (TYLENOL ) 650 MG CR tablet [id number] No Take 1 tablet (650 mg total) by mouth every 8 (eight) hours as needed for pain. Zhao, Xika, NP Taking Active Spouse/Significant Other, Self, Pharmacy Records, [name]    Discontinued 06/30/23 1746   apixaban  (ELIQUIS ) 5 MG TABS tablet [id number]  Take 1 tablet (5 mg total) by mouth 2 (two) times daily. Jobe Mulder, DO  Active   Ascorbic Acid  (VITAMIN C) 1000 MG tablet [id number] No Take 1,000 mg by mouth 3 (three) times a week. [provider] Taking Active Spouse/Significant Other, Self, Pharmacy Records, [name]  atorvastatin  (LIPITOR ) 80 MG tablet [id number]  Take 1 tablet (80 mg total) by mouth at bedtime. Jobe Mulder, DO  Active   clopidogrel  (PLAVIX ) 75 MG tablet [id number]  Take 1 tablet (75 mg total) by mouth daily with breakfast. Angiulli, Daniel J, PA-C  Active   clotrimazole -betamethasone  (LOTRISONE ) cream [id number] No Apply 1 Application topically daily. Jobe Mulder, DO Taking Active Spouse/Significant Other, Self, Pharmacy Records, [name]  cyanocobalamin  1000 MCG tablet [id number] No Take 1 tablet (1,000 mcg total) by mouth daily. Sterling Eisenmenger, PA-C Taking Active   escitalopram  (LEXAPRO ) 5 MG tablet [id number] No Take 1 tablet (5 mg total) by mouth daily. Jobe Mulder, DO Taking Active  ferrous sulfate  325 (65 FE) MG tablet [id number] No Take 1 tablet (325 mg total) by mouth daily with breakfast. Sterling Eisenmenger, PA-C Taking Active   finasteride  (PROSCAR ) 5 MG tablet [id number] No Take 1 tablet (5 mg total) by mouth daily.  Sterling Eisenmenger, PA-C Taking Active   hyoscyamine  (LEVSIN SL) 0.125 MG SL tablet [id number] No Place 1 tablet (0.125 mg total) under the tongue every 4 (four) hours as needed for bladder spasms. Sterling Eisenmenger, PA-C Taking Active   loperamide  (IMODIUM ) 2 MG capsule [id number] No Take 1 capsule (2 mg total) by mouth as needed for diarrhea or loose stools. Zelda Hickman, PA-C Taking Active   metFORMIN  (GLUCOPHAGE ) 500 MG tablet [id number]  Take 1 tablet (500 mg total) by mouth 2 (two) times daily with a meal. Jobe Mulder, DO  Active   midodrine  (PROAMATINE ) 5 MG tablet [id number] No Take 1 tablet (5 mg total) by mouth 3 (three) times daily with meals. Sterling Eisenmenger, PA-C Taking Active   Multiple Vitamin (MULTIVITAMIN) tablet [id number] No Take 1 tablet by mouth 2 (two) times a week. [provider] Taking Active Spouse/Significant Other, Self, Pharmacy Records, [name]  nystatin  (MYCOSTATIN /NYSTOP ) powder [id number] No Apply topically 3 (three) times daily. Zelda Hickman, PA-C Taking Active   pantoprazole  (PROTONIX ) 40 MG tablet [id number] No Take 1 tablet (40 mg total) by mouth at bedtime. Sterling Eisenmenger, PA-C Taking Active             Recommendation:   Continue Current Plan of Care John [name] encouraged to engage in self care and prioritize rest. John. [name] will continue to collaborate with [facility].   Follow Up Plan:   LCSW A Sharry Deem will contact John. [name] 09/20/2023 10am  Fletcher Humble MSW, LCSW Licensed Clinical Social Worker  [facility], Population Health Direct Dial : [phone]  Fax: [phone]

## 2023-09-07 NOTE — Patient Outreach (Signed)
 Complex Care Management   Visit Note  09/07/2023  Name:  John [name] MRN: [id number] DOB: [birth year]  Situation: Referral received for Complex Care Management related to Stroke I obtained verbal consent from Caregiver.  Visit completed with V Guajardo   on the phone  Background:   Past Medical History:  Diagnosis Date   BPH (benign prostatic hyperplasia)    CTS (carpal tunnel syndrome)    Depression    Diabetes mellitus    Essential hypertension    GERD (gastroesophageal reflux disease)    History of breast cancer    Hypercholesteremia    Neuropathy     Assessment: Patient Reported Symptoms:  Cognitive Cognitive Status: Unable to Assess (pt unavailable spk with spouse V Whittier) Cognitive/Intellectual Conditions Management [RPT]: Not Assessed      Neurological Neurological Review of Symptoms: No symptoms reported Neurological Conditions: Stroke, hemorrhagic  HEENT HEENT Symptoms Reported: No symptoms reported      Cardiovascular Cardiovascular Symptoms Reported: No symptoms reported Cardiovascular Conditions: Hypertension  Respiratory Respiratory Symptoms Reported: No symptoms reported    Endocrine Patient reports the following symptoms related to hypoglycemia or hyperglycemia : No symptoms reported Is patient diabetic?: Yes    Gastrointestinal Gastrointestinal Symptoms Reported: No symptoms reported      Genitourinary Genitourinary Symptoms Reported: No symptoms reported Genitourinary Conditions: Chronic kidney disease  Integumentary Integumentary Symptoms Reported: No symptoms reported    Musculoskeletal Musculoskelatal Symptoms Reviewed: Not assessed        Psychosocial       Quality of Family Relationships: supportive Do you feel physically threatened by others?: No      01/28/2023    2:18 PM  Depression screen PHQ 2/9  Decreased Interest 0  Down, Depressed, Hopeless 0  PHQ - 2 Score 0    There were no vitals filed for this visit.  Medications  Reviewed Today     Reviewed by Fletcher Humble, LCSW (Social Worker) on 09/07/23 at 1633  Med List Status: <None>   Medication Order Taking? Sig Documenting Provider Last Dose Status Informant  acetaminophen  (TYLENOL ) 650 MG CR tablet [id number] No Take 1 tablet (650 mg total) by mouth every 8 (eight) hours as needed for pain. Zhao, Xika, NP Taking Active Spouse/Significant Other, Self, Pharmacy Records, [name]    Discontinued 06/30/23 1746   apixaban  (ELIQUIS ) 5 MG TABS tablet [id number]  Take 1 tablet (5 mg total) by mouth 2 (two) times daily. Jobe Mulder, DO  Active   Ascorbic Acid  (VITAMIN C) 1000 MG tablet [id number] No Take 1,000 mg by mouth 3 (three) times a week. [provider] Taking Active Spouse/Significant Other, Self, Pharmacy Records, [name]  atorvastatin  (LIPITOR ) 80 MG tablet [id number]  Take 1 tablet (80 mg total) by mouth at bedtime. Jobe Mulder, DO  Active   clopidogrel  (PLAVIX ) 75 MG tablet [id number]  Take 1 tablet (75 mg total) by mouth daily with breakfast. Sterling Eisenmenger, PA-C  Active   clotrimazole -betamethasone  (LOTRISONE ) cream [id number] No Apply 1 Application topically daily. Jobe Mulder, DO Taking Active Spouse/Significant Other, Self, Pharmacy Records, [name]  cyanocobalamin  1000 MCG tablet [id number] No Take 1 tablet (1,000 mcg total) by mouth daily. Sterling Eisenmenger, PA-C Taking Active   escitalopram  (LEXAPRO ) 5 MG tablet [id number] No Take 1 tablet (5 mg total) by mouth daily. Jobe Mulder, DO Taking Active   ferrous sulfate  325 (65 FE) MG tablet [id number] No Take 1 tablet (325 mg total) by mouth daily with  breakfast. Sterling Eisenmenger, PA-C Taking Active   finasteride  (PROSCAR ) 5 MG tablet [id number] No Take 1 tablet (5 mg total) by mouth daily. Sterling Eisenmenger, PA-C Taking Active   hyoscyamine  (LEVSIN SL) 0.125 MG SL tablet [id number] No Place 1 tablet (0.125 mg total)  under the tongue every 4 (four) hours as needed for bladder spasms. Sterling Eisenmenger, PA-C Taking Active   loperamide  (IMODIUM ) 2 MG capsule [id number] No Take 1 capsule (2 mg total) by mouth as needed for diarrhea or loose stools. Zelda Hickman, PA-C Taking Active   metFORMIN  (GLUCOPHAGE ) 500 MG tablet [id number]  Take 1 tablet (500 mg total) by mouth 2 (two) times daily with a meal. Jobe Mulder, DO  Active   midodrine  (PROAMATINE ) 5 MG tablet [id number] No Take 1 tablet (5 mg total) by mouth 3 (three) times daily with meals. Sterling Eisenmenger, PA-C Taking Active   Multiple Vitamin (MULTIVITAMIN) tablet [id number] No Take 1 tablet by mouth 2 (two) times a week. [provider] Taking Active Spouse/Significant Other, Self, Pharmacy Records, [name]  nystatin  (MYCOSTATIN /NYSTOP ) powder [id number] No Apply topically 3 (three) times daily. Zelda Hickman, PA-C Taking Active   pantoprazole  (PROTONIX ) 40 MG tablet [id number] No Take 1 tablet (40 mg total) by mouth at bedtime. Sterling Eisenmenger, PA-C Taking Active             Recommendation:   Continue Current Plan of Care Pt will cotinue to collaborate with [facility]. LCSW A Morrie Daywalt will provide emotional/caregiver support to Ms. [place] and will provide resources as needed. According to notes order was placed for hospital bed. LCSW A Sharry Deem will follow up.   Follow Up Plan:   Telephone follow up appointment date/time:  09/20/2023  10am  Fletcher Humble MSW, LCSW Licensed Clinical Social Worker  [facility], Population Health Direct Dial : [phone]  Fax: [phone]

## 2023-09-07 NOTE — Patient Instructions (Signed)
 Visit Information  Thank you for taking time to visit with me today. Please don't hesitate to contact me if I can be of assistance to you before our next scheduled appointment.  Your next care management appointment is by telephone on 09/20/2023 at 10am  Telephone follow up appointment date/time:  09/20/2023 10am  Please call the care guide team at [phone] if you need to cancel, schedule, or reschedule an appointment.   Please call 911 if you are experiencing a Mental Health or Behavioral Health Crisis or need someone to talk to.  Fletcher Humble MSW, LCSW Licensed Clinical Social Worker  [facility], Population Health Direct Dial : [phone]  Fax: [phone]

## 2023-09-08 DIAGNOSIS — Z6826 Body mass index (BMI) 26.0-26.9, adult: Secondary | ICD-10-CM | POA: Diagnosis not present

## 2023-09-08 DIAGNOSIS — N400 Enlarged prostate without lower urinary tract symptoms: Secondary | ICD-10-CM | POA: Diagnosis not present

## 2023-09-08 DIAGNOSIS — D631 Anemia in chronic kidney disease: Secondary | ICD-10-CM | POA: Diagnosis not present

## 2023-09-08 DIAGNOSIS — Z7984 Long term (current) use of oral hypoglycemic drugs: Secondary | ICD-10-CM | POA: Diagnosis not present

## 2023-09-08 DIAGNOSIS — I69354 Hemiplegia and hemiparesis following cerebral infarction affecting left non-dominant side: Secondary | ICD-10-CM | POA: Diagnosis not present

## 2023-09-08 DIAGNOSIS — E860 Dehydration: Secondary | ICD-10-CM | POA: Diagnosis not present

## 2023-09-08 DIAGNOSIS — E66811 Obesity, class 1: Secondary | ICD-10-CM | POA: Diagnosis not present

## 2023-09-08 DIAGNOSIS — E43 Unspecified severe protein-calorie malnutrition: Secondary | ICD-10-CM | POA: Diagnosis not present

## 2023-09-08 DIAGNOSIS — E1142 Type 2 diabetes mellitus with diabetic polyneuropathy: Secondary | ICD-10-CM | POA: Diagnosis not present

## 2023-09-08 DIAGNOSIS — G5600 Carpal tunnel syndrome, unspecified upper limb: Secondary | ICD-10-CM | POA: Diagnosis not present

## 2023-09-08 DIAGNOSIS — Z9181 History of falling: Secondary | ICD-10-CM | POA: Diagnosis not present

## 2023-09-08 DIAGNOSIS — N189 Chronic kidney disease, unspecified: Secondary | ICD-10-CM | POA: Diagnosis not present

## 2023-09-08 DIAGNOSIS — R296 Repeated falls: Secondary | ICD-10-CM | POA: Diagnosis not present

## 2023-09-08 DIAGNOSIS — K219 Gastro-esophageal reflux disease without esophagitis: Secondary | ICD-10-CM | POA: Diagnosis not present

## 2023-09-08 DIAGNOSIS — E7800 Pure hypercholesterolemia, unspecified: Secondary | ICD-10-CM | POA: Diagnosis not present

## 2023-09-08 DIAGNOSIS — H43819 Vitreous degeneration, unspecified eye: Secondary | ICD-10-CM | POA: Diagnosis not present

## 2023-09-08 DIAGNOSIS — Z950 Presence of cardiac pacemaker: Secondary | ICD-10-CM | POA: Diagnosis not present

## 2023-09-08 DIAGNOSIS — I129 Hypertensive chronic kidney disease with stage 1 through stage 4 chronic kidney disease, or unspecified chronic kidney disease: Secondary | ICD-10-CM | POA: Diagnosis not present

## 2023-09-08 DIAGNOSIS — I959 Hypotension, unspecified: Secondary | ICD-10-CM | POA: Diagnosis not present

## 2023-09-08 DIAGNOSIS — K589 Irritable bowel syndrome without diarrhea: Secondary | ICD-10-CM | POA: Diagnosis not present

## 2023-09-08 DIAGNOSIS — G4700 Insomnia, unspecified: Secondary | ICD-10-CM | POA: Diagnosis not present

## 2023-09-08 DIAGNOSIS — Z853 Personal history of malignant neoplasm of breast: Secondary | ICD-10-CM | POA: Diagnosis not present

## 2023-09-08 DIAGNOSIS — F325 Major depressive disorder, single episode, in full remission: Secondary | ICD-10-CM | POA: Diagnosis not present

## 2023-09-08 DIAGNOSIS — E1122 Type 2 diabetes mellitus with diabetic chronic kidney disease: Secondary | ICD-10-CM | POA: Diagnosis not present

## 2023-09-08 DIAGNOSIS — Z8616 Personal history of COVID-19: Secondary | ICD-10-CM | POA: Diagnosis not present

## 2023-09-09 ENCOUNTER — Ambulatory Visit: Attending: Cardiology | Admitting: Cardiology

## 2023-09-09 ENCOUNTER — Encounter: Payer: Self-pay | Admitting: Cardiology

## 2023-09-09 ENCOUNTER — Telehealth: Payer: Self-pay

## 2023-09-09 VITALS — BP 94/57 | HR 85 | Ht 73.0 in | Wt 197.0 lb

## 2023-09-09 DIAGNOSIS — I5022 Chronic systolic (congestive) heart failure: Secondary | ICD-10-CM

## 2023-09-09 DIAGNOSIS — I2510 Atherosclerotic heart disease of native coronary artery without angina pectoris: Secondary | ICD-10-CM | POA: Diagnosis not present

## 2023-09-09 DIAGNOSIS — I255 Ischemic cardiomyopathy: Secondary | ICD-10-CM

## 2023-09-09 DIAGNOSIS — I442 Atrioventricular block, complete: Secondary | ICD-10-CM

## 2023-09-09 DIAGNOSIS — Z950 Presence of cardiac pacemaker: Secondary | ICD-10-CM | POA: Diagnosis not present

## 2023-09-09 NOTE — Progress Notes (Signed)
 Electrophysiology Office Note:   Date:  09/09/2023  ID:  John [name], DOB 05/04/1945, MRN [id number]  Primary Cardiologist: [name], MD Electrophysiologist: Ardeen Kohler, MD      History of Present Illness:   John [name] is a 78 y.o. 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   Discussed the use of AI scribe software for clinical note transcription with the patient, who gave verbal consent to proceed.  History of Present Illness John [name] is a 78 year old male with a dual chamber pacemaker for intermittent complete heart block who presents for device interrogation and follow-up. He is accompanied by his wife. Pacemaker was implanted on 05/26/23. Unfortunately, he has had numerous admissions in the past month. He developed urinary retention and urosepsis requiring placement of an indwelling foley catheter. During the same hospital stay, he developed cholecystitis and had an NSTEMI. LHC showed severe mid LAD stenosis requiring PCI. Echo at that time showed newly reduced LVEF to 30-35%. He has had a slow recovery since discharge, requiring home PT, but is progressing. No new or acute complaints today.    Review of systems complete and found to be negative unless listed in HPI.   EP Information / Studies Reviewed:    EKG is not ordered today. EKG from 08/30/23 reviewed which showed sinus rhythm with RBBB.      Echo 08/11/23:  1. Left ventricular ejection fraction, by estimation, is 30 to 35%. The  left ventricle has moderately decreased function. The left ventricle  demonstrates regional wall motion abnormalities with mid to apical  anteroseptal and inferoseptal akinesis and  akinesis of the apical inferior/anterior/lateral walls. Akinesis of the  true  apex. Findings suggest LAD territory infarction. No LV thrombus.  There is mild concentric left ventricular hypertrophy. Left ventricular  diastolic parameters are consistent with   Grade I diastolic dysfunction (impaired relaxation).   2. Right ventricular systolic function is normal. The right ventricular  size is normal. There is mildly elevated pulmonary artery systolic  pressure. The estimated right ventricular systolic pressure is 41.9 mmHg.   3. The aortic valve is tricuspid. There is mild calcification of the  aortic valve. Aortic valve regurgitation is mild. No aortic stenosis is  present.   4. The inferior vena cava is dilated in size with >50% respiratory  variability, suggesting right atrial pressure of 8 mmHg.   5. The mitral valve is normal in structure. No evidence of mitral valve  regurgitation. No evidence of mitral stenosis.   LHC 08/10/23:    3rd Mrg lesion is 70% stenosed.   Prox RCA lesion is 95% stenosed.   1st LPL lesion is 30% stenosed.   LPDA lesion is 80% stenosed.   Mid LAD-1 lesion is 95% stenosed.   Mid LAD-2 lesion is 50% stenosed.   Dist LAD lesion is 40% stenosed.   Prox LAD to Mid LAD lesion is 40% stenosed.   Lat 1st Diag lesion is 80% stenosed.   A drug-eluting stent was successfully placed using a STENT SYNERGY XD [id number].   Post intervention, there is a 0% residual stenosis.   Post intervention, there is a 0% residual stenosis.   LV end diastolic pressure is mildly elevated.   1.  Left dominant coronary arteries with significant three-vessel  coronary artery disease.  The culprit is severe stenosis in the mid LAD.  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 2.  Left ventricular angiography was not diagnostic.  Will obtain an echocardiogram.  LVEDP was mildly to moderately elevated at 20 mmHg. 3.  Successful angioplasty and drug-eluting stent placement to the mid  LAD.  Physical Exam:   VS:  BP (!) 94/57   Pulse 85   Ht 6\' 1"  (1.854 m)   Wt 197 lb (89.4 kg)   SpO2 99%   BMI 25.99 kg/m    Wt Readings from Last 3 Encounters:  09/09/23 197 lb (89.4 kg)  08/23/23 197 lb (89.4 kg)  08/13/23 197 lb 15.6 oz (89.8 kg)     GEN: Well nourished, well developed in no acute distress NECK: No JVD CARDIAC: Normal rate, regular rhythm RESPIRATORY:  Clear to auscultation without rales, wheezing or rhonchi  ABDOMEN: Soft, non-distended EXTREMITIES:  No edema; No deformity   ASSESSMENT AND PLAN:    #. Intermittent CHB s/p dual chamber pacemaker: Recovered conduction - minimal ventricular pacing.  -In clinic device interrogation was performed, appropriate device function and stable lead parameters.  -Continue remote monitoring.   #. CAD s/p LAD PCI: No chest pain.  - Continue clopidogrel  75mg  daily and atorvastatin  80mg  BID.  - Patient would like to establish care with Dr. Berry Bristol.  #. Chronic systolic heart failure: Reduced EF in setting of NSTEMI. Well compensated on exam today.  #. Ischemic cardiomyopathy - Will need repeat echocardiogram 90 days after revascularization to assess for improvement in LVEF. - GDMT limited by hypotension.   Follow up with Dr. Daneil Dunker in 6 months  Signed, Ardeen Kohler, MD

## 2023-09-09 NOTE — Telephone Encounter (Signed)
 Called pt was advised its [facility] , Pt wife stated  They call and is bring bed out tomorrow.

## 2023-09-09 NOTE — Patient Instructions (Signed)
 Medication Instructions:  Your physician recommends that you continue on your current medications as directed. Please refer to the Current Medication list given to you today.  *If you need a refill on your cardiac medications before your next appointment, please call your pharmacy*  Lab Work: None ordered  Testing/Procedures: None ordered  Follow-Up: At [facility], you and your health needs are our priority.  As part of our continuing mission to provide you with exceptional heart care, our providers are all part of one team.  This team includes your primary Cardiologist (physician) and Advanced Practice Providers or APPs (Physician Assistants and Nurse Practitioners) who all work together to provide you with the care you need, when you need it.  Your next appointment:   6 month(s)  Provider:   Ardeen Kohler, MD     Thank you for choosing [facility]!!   [phone]

## 2023-09-09 NOTE — Telephone Encounter (Signed)
 Message sent about hospital bed.

## 2023-09-10 ENCOUNTER — Other Ambulatory Visit (HOSPITAL_BASED_OUTPATIENT_CLINIC_OR_DEPARTMENT_OTHER): Payer: Self-pay

## 2023-09-10 DIAGNOSIS — R5381 Other malaise: Secondary | ICD-10-CM | POA: Diagnosis not present

## 2023-09-10 DIAGNOSIS — L89159 Pressure ulcer of sacral region, unspecified stage: Secondary | ICD-10-CM | POA: Diagnosis not present

## 2023-09-10 DIAGNOSIS — I63511 Cerebral infarction due to unspecified occlusion or stenosis of right middle cerebral artery: Secondary | ICD-10-CM | POA: Diagnosis not present

## 2023-09-12 ENCOUNTER — Other Ambulatory Visit: Payer: Self-pay

## 2023-09-12 ENCOUNTER — Other Ambulatory Visit (HOSPITAL_COMMUNITY): Payer: Self-pay

## 2023-09-12 ENCOUNTER — Other Ambulatory Visit (HOSPITAL_BASED_OUTPATIENT_CLINIC_OR_DEPARTMENT_OTHER): Payer: Self-pay

## 2023-09-12 DIAGNOSIS — G5600 Carpal tunnel syndrome, unspecified upper limb: Secondary | ICD-10-CM | POA: Diagnosis not present

## 2023-09-12 DIAGNOSIS — R296 Repeated falls: Secondary | ICD-10-CM | POA: Diagnosis not present

## 2023-09-12 DIAGNOSIS — Z8616 Personal history of COVID-19: Secondary | ICD-10-CM | POA: Diagnosis not present

## 2023-09-12 DIAGNOSIS — K219 Gastro-esophageal reflux disease without esophagitis: Secondary | ICD-10-CM | POA: Diagnosis not present

## 2023-09-12 DIAGNOSIS — N189 Chronic kidney disease, unspecified: Secondary | ICD-10-CM | POA: Diagnosis not present

## 2023-09-12 DIAGNOSIS — Z6826 Body mass index (BMI) 26.0-26.9, adult: Secondary | ICD-10-CM | POA: Diagnosis not present

## 2023-09-12 DIAGNOSIS — Z9181 History of falling: Secondary | ICD-10-CM | POA: Diagnosis not present

## 2023-09-12 DIAGNOSIS — N400 Enlarged prostate without lower urinary tract symptoms: Secondary | ICD-10-CM | POA: Diagnosis not present

## 2023-09-12 DIAGNOSIS — K589 Irritable bowel syndrome without diarrhea: Secondary | ICD-10-CM | POA: Diagnosis not present

## 2023-09-12 DIAGNOSIS — E1142 Type 2 diabetes mellitus with diabetic polyneuropathy: Secondary | ICD-10-CM | POA: Diagnosis not present

## 2023-09-12 DIAGNOSIS — I129 Hypertensive chronic kidney disease with stage 1 through stage 4 chronic kidney disease, or unspecified chronic kidney disease: Secondary | ICD-10-CM | POA: Diagnosis not present

## 2023-09-12 DIAGNOSIS — Z7984 Long term (current) use of oral hypoglycemic drugs: Secondary | ICD-10-CM | POA: Diagnosis not present

## 2023-09-12 DIAGNOSIS — Z950 Presence of cardiac pacemaker: Secondary | ICD-10-CM | POA: Diagnosis not present

## 2023-09-12 DIAGNOSIS — E66811 Obesity, class 1: Secondary | ICD-10-CM | POA: Diagnosis not present

## 2023-09-12 DIAGNOSIS — H43819 Vitreous degeneration, unspecified eye: Secondary | ICD-10-CM | POA: Diagnosis not present

## 2023-09-12 DIAGNOSIS — I69354 Hemiplegia and hemiparesis following cerebral infarction affecting left non-dominant side: Secondary | ICD-10-CM | POA: Diagnosis not present

## 2023-09-12 DIAGNOSIS — D631 Anemia in chronic kidney disease: Secondary | ICD-10-CM | POA: Diagnosis not present

## 2023-09-12 DIAGNOSIS — Z853 Personal history of malignant neoplasm of breast: Secondary | ICD-10-CM | POA: Diagnosis not present

## 2023-09-12 DIAGNOSIS — E43 Unspecified severe protein-calorie malnutrition: Secondary | ICD-10-CM | POA: Diagnosis not present

## 2023-09-12 DIAGNOSIS — G4700 Insomnia, unspecified: Secondary | ICD-10-CM | POA: Diagnosis not present

## 2023-09-12 DIAGNOSIS — E7800 Pure hypercholesterolemia, unspecified: Secondary | ICD-10-CM | POA: Diagnosis not present

## 2023-09-12 DIAGNOSIS — F325 Major depressive disorder, single episode, in full remission: Secondary | ICD-10-CM | POA: Diagnosis not present

## 2023-09-12 DIAGNOSIS — E860 Dehydration: Secondary | ICD-10-CM | POA: Diagnosis not present

## 2023-09-12 DIAGNOSIS — I959 Hypotension, unspecified: Secondary | ICD-10-CM | POA: Diagnosis not present

## 2023-09-12 DIAGNOSIS — E1122 Type 2 diabetes mellitus with diabetic chronic kidney disease: Secondary | ICD-10-CM | POA: Diagnosis not present

## 2023-09-13 ENCOUNTER — Other Ambulatory Visit (HOSPITAL_BASED_OUTPATIENT_CLINIC_OR_DEPARTMENT_OTHER): Payer: Self-pay

## 2023-09-13 ENCOUNTER — Other Ambulatory Visit: Payer: Self-pay | Admitting: Family Medicine

## 2023-09-13 DIAGNOSIS — N189 Chronic kidney disease, unspecified: Secondary | ICD-10-CM | POA: Diagnosis not present

## 2023-09-13 DIAGNOSIS — E7800 Pure hypercholesterolemia, unspecified: Secondary | ICD-10-CM | POA: Diagnosis not present

## 2023-09-13 DIAGNOSIS — I959 Hypotension, unspecified: Secondary | ICD-10-CM | POA: Diagnosis not present

## 2023-09-13 DIAGNOSIS — G4700 Insomnia, unspecified: Secondary | ICD-10-CM | POA: Diagnosis not present

## 2023-09-13 DIAGNOSIS — Z7984 Long term (current) use of oral hypoglycemic drugs: Secondary | ICD-10-CM | POA: Diagnosis not present

## 2023-09-13 DIAGNOSIS — F325 Major depressive disorder, single episode, in full remission: Secondary | ICD-10-CM | POA: Diagnosis not present

## 2023-09-13 DIAGNOSIS — I69354 Hemiplegia and hemiparesis following cerebral infarction affecting left non-dominant side: Secondary | ICD-10-CM | POA: Diagnosis not present

## 2023-09-13 DIAGNOSIS — Z6826 Body mass index (BMI) 26.0-26.9, adult: Secondary | ICD-10-CM | POA: Diagnosis not present

## 2023-09-13 DIAGNOSIS — Z950 Presence of cardiac pacemaker: Secondary | ICD-10-CM | POA: Diagnosis not present

## 2023-09-13 DIAGNOSIS — E1122 Type 2 diabetes mellitus with diabetic chronic kidney disease: Secondary | ICD-10-CM | POA: Diagnosis not present

## 2023-09-13 DIAGNOSIS — R296 Repeated falls: Secondary | ICD-10-CM | POA: Diagnosis not present

## 2023-09-13 DIAGNOSIS — Z853 Personal history of malignant neoplasm of breast: Secondary | ICD-10-CM | POA: Diagnosis not present

## 2023-09-13 DIAGNOSIS — H43819 Vitreous degeneration, unspecified eye: Secondary | ICD-10-CM | POA: Diagnosis not present

## 2023-09-13 DIAGNOSIS — K589 Irritable bowel syndrome without diarrhea: Secondary | ICD-10-CM | POA: Diagnosis not present

## 2023-09-13 DIAGNOSIS — E860 Dehydration: Secondary | ICD-10-CM | POA: Diagnosis not present

## 2023-09-13 DIAGNOSIS — E1142 Type 2 diabetes mellitus with diabetic polyneuropathy: Secondary | ICD-10-CM | POA: Diagnosis not present

## 2023-09-13 DIAGNOSIS — K219 Gastro-esophageal reflux disease without esophagitis: Secondary | ICD-10-CM | POA: Diagnosis not present

## 2023-09-13 DIAGNOSIS — Z8616 Personal history of COVID-19: Secondary | ICD-10-CM | POA: Diagnosis not present

## 2023-09-13 DIAGNOSIS — I129 Hypertensive chronic kidney disease with stage 1 through stage 4 chronic kidney disease, or unspecified chronic kidney disease: Secondary | ICD-10-CM | POA: Diagnosis not present

## 2023-09-13 DIAGNOSIS — Z9181 History of falling: Secondary | ICD-10-CM | POA: Diagnosis not present

## 2023-09-13 DIAGNOSIS — E66811 Obesity, class 1: Secondary | ICD-10-CM | POA: Diagnosis not present

## 2023-09-13 DIAGNOSIS — E43 Unspecified severe protein-calorie malnutrition: Secondary | ICD-10-CM | POA: Diagnosis not present

## 2023-09-13 DIAGNOSIS — D631 Anemia in chronic kidney disease: Secondary | ICD-10-CM | POA: Diagnosis not present

## 2023-09-13 DIAGNOSIS — N400 Enlarged prostate without lower urinary tract symptoms: Secondary | ICD-10-CM | POA: Diagnosis not present

## 2023-09-13 DIAGNOSIS — G5600 Carpal tunnel syndrome, unspecified upper limb: Secondary | ICD-10-CM | POA: Diagnosis not present

## 2023-09-13 MED ORDER — FINASTERIDE 5 MG PO TABS
5.0000 mg | ORAL_TABLET | Freq: Every day | ORAL | 0 refills | Status: DC
  Filled 2023-09-13: qty 30, 30d supply, fill #0

## 2023-09-13 MED ORDER — CLOPIDOGREL BISULFATE 75 MG PO TABS
75.0000 mg | ORAL_TABLET | Freq: Every day | ORAL | 0 refills | Status: DC
  Filled 2023-09-13: qty 30, 30d supply, fill #0

## 2023-09-13 MED ORDER — MIDODRINE HCL 5 MG PO TABS
5.0000 mg | ORAL_TABLET | Freq: Three times a day (TID) | ORAL | 0 refills | Status: DC
  Filled 2023-09-13: qty 90, 30d supply, fill #0

## 2023-09-14 ENCOUNTER — Other Ambulatory Visit (HOSPITAL_BASED_OUTPATIENT_CLINIC_OR_DEPARTMENT_OTHER): Payer: Self-pay

## 2023-09-14 ENCOUNTER — Other Ambulatory Visit: Payer: Self-pay

## 2023-09-14 MED ORDER — CYANOCOBALAMIN 1000 MCG PO TABS
1000.0000 ug | ORAL_TABLET | Freq: Every day | ORAL | 0 refills | Status: DC
  Filled 2023-09-14: qty 100, 100d supply, fill #0

## 2023-09-14 MED ORDER — MIDODRINE HCL 5 MG PO TABS
5.0000 mg | ORAL_TABLET | Freq: Three times a day (TID) | ORAL | 0 refills | Status: DC
  Filled 2023-09-14: qty 90, 30d supply, fill #0

## 2023-09-14 MED ORDER — FERROUS SULFATE 325 (65 FE) MG PO TABS
325.0000 mg | ORAL_TABLET | Freq: Every day | ORAL | 0 refills | Status: DC
  Filled 2023-09-14: qty 100, 100d supply, fill #0

## 2023-09-14 MED ORDER — ATORVASTATIN CALCIUM 80 MG PO TABS
80.0000 mg | ORAL_TABLET | Freq: Every day | ORAL | 1 refills | Status: DC
  Filled 2023-09-14: qty 90, 90d supply, fill #0
  Filled 2023-12-18: qty 90, 90d supply, fill #1

## 2023-09-14 MED ORDER — FINASTERIDE 5 MG PO TABS
5.0000 mg | ORAL_TABLET | Freq: Every day | ORAL | 0 refills | Status: DC
  Filled 2023-10-11: qty 30, 30d supply, fill #0

## 2023-09-14 MED ORDER — APIXABAN 5 MG PO TABS
5.0000 mg | ORAL_TABLET | Freq: Two times a day (BID) | ORAL | 0 refills | Status: DC
  Filled 2023-09-14: qty 60, 30d supply, fill #0

## 2023-09-14 MED ORDER — METFORMIN HCL 500 MG PO TABS
500.0000 mg | ORAL_TABLET | Freq: Two times a day (BID) | ORAL | 1 refills | Status: DC
  Filled 2023-09-14: qty 180, 90d supply, fill #0

## 2023-09-14 MED ORDER — ESCITALOPRAM OXALATE 5 MG PO TABS
5.0000 mg | ORAL_TABLET | Freq: Every day | ORAL | 1 refills | Status: DC
Start: 2023-09-14 — End: 2023-10-03
  Filled 2023-09-14 – 2023-09-15 (×2): qty 30, 30d supply, fill #0

## 2023-09-15 ENCOUNTER — Other Ambulatory Visit: Payer: Self-pay

## 2023-09-15 ENCOUNTER — Other Ambulatory Visit (HOSPITAL_BASED_OUTPATIENT_CLINIC_OR_DEPARTMENT_OTHER): Payer: Self-pay

## 2023-09-16 DIAGNOSIS — E1122 Type 2 diabetes mellitus with diabetic chronic kidney disease: Secondary | ICD-10-CM | POA: Diagnosis not present

## 2023-09-16 DIAGNOSIS — Z6826 Body mass index (BMI) 26.0-26.9, adult: Secondary | ICD-10-CM | POA: Diagnosis not present

## 2023-09-16 DIAGNOSIS — E43 Unspecified severe protein-calorie malnutrition: Secondary | ICD-10-CM | POA: Diagnosis not present

## 2023-09-16 DIAGNOSIS — N400 Enlarged prostate without lower urinary tract symptoms: Secondary | ICD-10-CM | POA: Diagnosis not present

## 2023-09-16 DIAGNOSIS — E1142 Type 2 diabetes mellitus with diabetic polyneuropathy: Secondary | ICD-10-CM | POA: Diagnosis not present

## 2023-09-16 DIAGNOSIS — Z8616 Personal history of COVID-19: Secondary | ICD-10-CM | POA: Diagnosis not present

## 2023-09-16 DIAGNOSIS — K589 Irritable bowel syndrome without diarrhea: Secondary | ICD-10-CM | POA: Diagnosis not present

## 2023-09-16 DIAGNOSIS — E66811 Obesity, class 1: Secondary | ICD-10-CM | POA: Diagnosis not present

## 2023-09-16 DIAGNOSIS — R296 Repeated falls: Secondary | ICD-10-CM | POA: Diagnosis not present

## 2023-09-16 DIAGNOSIS — K219 Gastro-esophageal reflux disease without esophagitis: Secondary | ICD-10-CM | POA: Diagnosis not present

## 2023-09-16 DIAGNOSIS — G5600 Carpal tunnel syndrome, unspecified upper limb: Secondary | ICD-10-CM | POA: Diagnosis not present

## 2023-09-16 DIAGNOSIS — I69354 Hemiplegia and hemiparesis following cerebral infarction affecting left non-dominant side: Secondary | ICD-10-CM | POA: Diagnosis not present

## 2023-09-16 DIAGNOSIS — Z853 Personal history of malignant neoplasm of breast: Secondary | ICD-10-CM | POA: Diagnosis not present

## 2023-09-16 DIAGNOSIS — N189 Chronic kidney disease, unspecified: Secondary | ICD-10-CM | POA: Diagnosis not present

## 2023-09-16 DIAGNOSIS — E860 Dehydration: Secondary | ICD-10-CM | POA: Diagnosis not present

## 2023-09-16 DIAGNOSIS — F325 Major depressive disorder, single episode, in full remission: Secondary | ICD-10-CM | POA: Diagnosis not present

## 2023-09-16 DIAGNOSIS — H43819 Vitreous degeneration, unspecified eye: Secondary | ICD-10-CM | POA: Diagnosis not present

## 2023-09-16 DIAGNOSIS — I959 Hypotension, unspecified: Secondary | ICD-10-CM | POA: Diagnosis not present

## 2023-09-16 DIAGNOSIS — Z950 Presence of cardiac pacemaker: Secondary | ICD-10-CM | POA: Diagnosis not present

## 2023-09-16 DIAGNOSIS — Z7984 Long term (current) use of oral hypoglycemic drugs: Secondary | ICD-10-CM | POA: Diagnosis not present

## 2023-09-16 DIAGNOSIS — I129 Hypertensive chronic kidney disease with stage 1 through stage 4 chronic kidney disease, or unspecified chronic kidney disease: Secondary | ICD-10-CM | POA: Diagnosis not present

## 2023-09-16 DIAGNOSIS — G4700 Insomnia, unspecified: Secondary | ICD-10-CM | POA: Diagnosis not present

## 2023-09-16 DIAGNOSIS — Z9181 History of falling: Secondary | ICD-10-CM | POA: Diagnosis not present

## 2023-09-16 DIAGNOSIS — E7800 Pure hypercholesterolemia, unspecified: Secondary | ICD-10-CM | POA: Diagnosis not present

## 2023-09-16 DIAGNOSIS — D631 Anemia in chronic kidney disease: Secondary | ICD-10-CM | POA: Diagnosis not present

## 2023-09-20 ENCOUNTER — Other Ambulatory Visit: Payer: Self-pay | Admitting: Licensed Clinical Social Worker

## 2023-09-20 ENCOUNTER — Telehealth: Admitting: Licensed Clinical Social Worker

## 2023-09-20 NOTE — Patient Instructions (Signed)

## 2023-09-20 NOTE — Patient Outreach (Signed)
 Complex Care Management   Visit Note  09/20/2023  Name:  John [name] MRN: [id number] DOB: [birth year]  Situation: Referral received for Complex Care Management related to Stroke I obtained verbal consent from Caregiver.  Visit completed with V Trela  on the phone. Mrs. John [name] reports pt received hospital bed. Mr John [name] is adjusting well at home. Pt and Mrs John [name] report no further concerns or questions   Background:   Past Medical History:  Diagnosis Date   BPH (benign prostatic hyperplasia)    CTS (carpal tunnel syndrome)    Depression    Diabetes mellitus    Essential hypertension    GERD (gastroesophageal reflux disease)    History of breast cancer    Hypercholesteremia    Neuropathy     Assessment: Patient Reported Symptoms:  Cognitive Cognitive Status: Unable to Assess Cognitive/Intellectual Conditions Management [RPT]: Not Assessed      Neurological Neurological Review of Symptoms: No symptoms reported Neurological Conditions: Stroke, hemorrhagic  HEENT HEENT Symptoms Reported: No symptoms reported      Cardiovascular Cardiovascular Symptoms Reported: No symptoms reported Cardiovascular Conditions: Hypertension  Respiratory Respiratory Symptoms Reported: No symptoms reported    Endocrine Patient reports the following symptoms related to hypoglycemia or hyperglycemia : No symptoms reported Is patient diabetic?: Yes Endocrine Conditions: Diabetes  Gastrointestinal Gastrointestinal Symptoms Reported: No symptoms reported      Genitourinary Genitourinary Symptoms Reported: No symptoms reported Genitourinary Conditions: Chronic kidney disease  Integumentary Integumentary Symptoms Reported: No symptoms reported    Musculoskeletal Musculoskelatal Symptoms Reviewed: No symptoms reported        Psychosocial       Quality of Family Relationships: supportive Do you feel physically threatened by others?: No      01/28/2023    2:18 PM  Depression screen PHQ 2/9   Decreased Interest 0  Down, Depressed, Hopeless 0  PHQ - 2 Score 0    There were no vitals filed for this visit.  Medications Reviewed Today     Reviewed by Fletcher Humble, LCSW (Social Worker) on 09/20/23 at 1539  Med List Status: <None>   Medication Order Taking? Sig Documenting Provider Last Dose Status Informant  acetaminophen  (TYLENOL ) 650 MG CR tablet [id number] No Take 1 tablet (650 mg total) by mouth every 8 (eight) hours as needed for pain. Zhao, Xika, NP Taking Active Spouse/Significant Other, Self, Pharmacy Records, [name]    Discontinued 06/30/23 1746   apixaban  (ELIQUIS ) 5 MG TABS tablet [id number]  Take 1 tablet (5 mg total) by mouth 2 (two) times daily. Jobe Mulder, DO  Active   Ascorbic Acid  (VITAMIN C) 1000 MG tablet [id number] No Take 1,000 mg by mouth 3 (three) times a week. [provider] Taking Active Spouse/Significant Other, Self, Pharmacy Records, [name]  atorvastatin  (LIPITOR ) 80 MG tablet [id number]  Take 1 tablet (80 mg total) by mouth at bedtime. Jobe Mulder, DO  Active   clopidogrel  (PLAVIX ) 75 MG tablet [id number]  Take 1 tablet (75 mg total) by mouth daily with breakfast. *Need appointment for future refills.Gwenette Lennox, Shellie Dials, DO  Active   clotrimazole -betamethasone  (LOTRISONE ) cream [id number] No Apply 1 Application topically daily. Jobe Mulder, DO Taking Active Spouse/Significant Other, Self, Pharmacy Records, [name]  cyanocobalamin  1000 MCG tablet [id number]  Take 1 tablet (1,000 mcg total) by mouth daily. Jobe Mulder, DO  Active   escitalopram  (LEXAPRO ) 5 MG tablet [id number]  Take 1 tablet (5 mg total) by mouth daily. Jobe Mulder,  DO  Active   ferrous sulfate  325 (65 FE) MG tablet [id number]  Take 1 tablet (325 mg total) by mouth daily with breakfast. Jobe Mulder, DO  Active   finasteride  (PROSCAR ) 5 MG tablet [id number]  Take 1 tablet (5  mg total) by mouth daily. *Need appointment for future refills.Gwenette Lennox, Shellie Dials, DO  Active   hyoscyamine  (LEVSIN SL) 0.125 MG SL tablet [id number] No Place 1 tablet (0.125 mg total) under the tongue every 4 (four) hours as needed for bladder spasms. Sterling Eisenmenger, PA-C Taking Active   loperamide  (IMODIUM ) 2 MG capsule [id number] No Take 1 capsule (2 mg total) by mouth as needed for diarrhea or loose stools. Zelda Hickman, PA-C Taking Active   metFORMIN  (GLUCOPHAGE ) 500 MG tablet [id number]  Take 1 tablet (500 mg total) by mouth 2 (two) times daily with a meal. Jobe Mulder, DO  Active   midodrine  (PROAMATINE ) 5 MG tablet [id number]  Take 1 tablet (5 mg total) by mouth 3 (three) times daily with meals. *Need appointment for future refills.Gwenette Lennox, Shellie Dials, DO  Active   Multiple Vitamin (MULTIVITAMIN) tablet [id number] No Take 1 tablet by mouth 2 (two) times a week. [provider] Taking Active Spouse/Significant Other, Self, Pharmacy Records, [name]  nystatin  (MYCOSTATIN /NYSTOP ) powder [id number] No Apply topically 3 (three) times daily. Zelda Hickman, PA-C Taking Active   pantoprazole  (PROTONIX ) 40 MG tablet [id number] No Take 1 tablet (40 mg total) by mouth at bedtime. Sterling Eisenmenger, PA-C Taking Active             Recommendation:   Continue Current Plan of Care  Follow Up Plan:   Patient has met all care management goals. Care Management case will be closed. Patient has been provided contact information should new needs arise.   Fletcher Humble MSW, LCSW Licensed Clinical Social Worker  [facility], [facility] Direct Dial : [phone]  Fax: [phone]

## 2023-09-21 ENCOUNTER — Ambulatory Visit: Admitting: Urology

## 2023-09-21 ENCOUNTER — Encounter: Payer: Self-pay | Admitting: Urology

## 2023-09-21 VITALS — BP 100/53 | HR 82 | Ht 72.0 in | Wt 197.0 lb

## 2023-09-21 DIAGNOSIS — Z978 Presence of other specified devices: Secondary | ICD-10-CM

## 2023-09-21 DIAGNOSIS — R338 Other retention of urine: Secondary | ICD-10-CM | POA: Diagnosis not present

## 2023-09-21 DIAGNOSIS — R5381 Other malaise: Secondary | ICD-10-CM | POA: Diagnosis not present

## 2023-09-21 DIAGNOSIS — N401 Enlarged prostate with lower urinary tract symptoms: Secondary | ICD-10-CM | POA: Diagnosis not present

## 2023-09-21 DIAGNOSIS — N138 Other obstructive and reflux uropathy: Secondary | ICD-10-CM

## 2023-09-21 DIAGNOSIS — I63511 Cerebral infarction due to unspecified occlusion or stenosis of right middle cerebral artery: Secondary | ICD-10-CM | POA: Diagnosis not present

## 2023-09-21 DIAGNOSIS — L89159 Pressure ulcer of sacral region, unspecified stage: Secondary | ICD-10-CM | POA: Diagnosis not present

## 2023-09-21 NOTE — Progress Notes (Signed)
 Chief Complaint: No chief complaint on file.   History of Present Illness:  John [name] is a 78 y.o. male who is seen in consultation from [place], DO for evaluation of urinary retention.  He has had an indwelling Foley catheter since 2 April.  He presented with lower abdominal pain and urinary retention.  He went to the emergency room where a catheter was placed, 1500 cc of urine was liberated.  He has had an indwelling catheter since that time, having had a change at least twice.  He is a patient of [facility], has seen Dr. Doyne Genin for quite some time.  Because of lack of ability to make an appointment, he saw [facility] for follow-up of his urinary retention.  He did have urodynamics performed on May 22.  Findings: Maximum bladder capacity 202 mL Bladder instability noted Patient voided 104 mL. Q-Maxx-3 mL/s Detrusor pressure at peak flow-88 cmH2O Maximum detrusor pressure 105 cm of water Patient did have evidence of obstruction  The patient had been on tamsulosin , but because of the stroke and hypotensive episodes, this was stopped.  He is still on finasteride .  Past Medical History:  Past Medical History:  Diagnosis Date   BPH (benign prostatic hyperplasia)    CTS (carpal tunnel syndrome)    Depression    Diabetes mellitus    Essential hypertension    GERD (gastroesophageal reflux disease)    History of breast cancer    Hypercholesteremia    Neuropathy     Past Surgical History:  Past Surgical History:  Procedure Laterality Date   CATARACT EXTRACTION, BILATERAL  01/18/2017   CORONARY STENT INTERVENTION N/A 08/10/2023   Procedure: CORONARY STENT INTERVENTION;  Surgeon: Wenona Hamilton, MD;  Location: [facility] INVASIVE CV LAB;  Service: Cardiovascular;  Laterality: N/A;   LEFT HEART CATH AND CORONARY ANGIOGRAPHY N/A 08/10/2023   Procedure: LEFT HEART CATH AND CORONARY ANGIOGRAPHY;  Surgeon: Wenona Hamilton, MD;  Location: [facility] INVASIVE  CV LAB;  Service: Cardiovascular;  Laterality: N/A;   LITHOTRIPSY     MASTECTOMY Right 05/02/2018   PACEMAKER IMPLANT N/A 05/13/2023   Procedure: PACEMAKER IMPLANT;  Surgeon: Ardeen Kohler, MD;  Location: [facility] INVASIVE CV LAB;  Service: Cardiovascular;  Laterality: N/A;   TEMPORARY PACEMAKER N/A 05/11/2023   Procedure: TEMPORARY PACEMAKER;  Surgeon: [place], Peter M, MD;  Location: [facility] INVASIVE CV LAB;  Service: Cardiovascular;  Laterality: N/A;   TONSILLECTOMY AND ADENOIDECTOMY      Allergies:  Allergies  Allergen Reactions   Ramipril Anaphylaxis   Other Diarrhea    Severe intolerance to Chemotherapy in the past.   Sertraline Other (See Comments)    Extreme headaches   Adhesive [Tape] Rash    Family History:  Family History  Problem Relation Age of Onset   Lymphoma Mother    Cancer Mother    Diabetes Father    Stroke Father    Ovarian cancer Sister    Cancer Sister    Cancer Paternal Grandmother     Social History:  Social History   Tobacco Use   Smoking status: Never   Smokeless tobacco: Never  Vaping Use   Vaping status: Never Used  Substance Use Topics   Alcohol use: No   Drug use: No    Review of symptoms:  Constitutional:  Negative for unexplained weight loss, night sweats, fever, chills ENT:  Negative for nose bleeds, sinus pain, painful swallowing CV:  Negative for chest pain, shortness of  breath, exercise intolerance, palpitations, loss of consciousness Resp:  Negative for cough, wheezing, shortness of breath GI:  Negative for nausea, vomiting, diarrhea, bloody stools GU:  Positives noted in HPI; otherwise negative for gross hematuria, dysuria, urinary incontinence Neuro:  Negative for seizures, poor balance, limb weakness, slurred speech Psych:  Negative for lack of energy, depression, anxiety Endocrine:  Negative for polydipsia, polyuria, symptoms of hypoglycemia (dizziness, hunger, sweating) Hematologic:  Negative for anemia, purpura, petechia,  prolonged or excessive bleeding, use of anticoagulants  Allergic:  Negative for difficulty breathing or choking as a result of exposure to anything; no shellfish allergy; no allergic response (rash/itch) to materials, foods  Physical exam: There were no vitals taken for this visit. GENERAL APPEARANCE:  Well appearing, well developed, well nourished, NAD HEENT: Atraumatic, Normocephalic. NECK: Normal appearance LUNGS: Normal inspiratory and expiratory excursion HEART: Regular Rate ABDOMEN: Mildly obese GU: Phallus buried l, no lesions. Scrotal skin normal. Testicles/epididymal structures normal. Meatus normal. Normal anal sphincter tone, prostate 50 mL, symmetric, non nodular, non tender.  Foley catheter present EXTREMITIES: Moves all extremities well.  Without clubbing, cyanosis, or edema. NEUROLOGIC:  Alert and oriented x 3, normal gait, CN II-XII grossly intact.  MENTAL STATUS:  Appropriate. SKIN:  Warm, dry and intact.    Results:  I have reviewed referring/prior physicians notes  I have reviewed urinalysis  I have reviewed PSA results--PSA 1.86 4 years ago  I have reviewed prior imaging--CT images from earlier this year reviewed.  Estimated prostate volume 60 mL.  I have reviewed urodynamic results from [facility]  Assessment: BPH with retention.  Currently catheter dependent     Plan: -I will keep patient off the alpha-blocker at this point, continue finasteride   - I strongly recommend that at the present time he try to learn self intermittent catheterization.  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  He will be on antiplatelet therapy for at least another couple of months, so he will have to wait some time for that intervention  -I we will have him come back in in a couple weeks.  We will attempt a voiding trial.  I will also discuss initiation of CIC with him  -I will eventually refer  him back to [facility], either Dr. Freddi Jaeger or Dr. Derrick Fling for consideration of intervention for BPH

## 2023-09-22 DIAGNOSIS — E43 Unspecified severe protein-calorie malnutrition: Secondary | ICD-10-CM | POA: Diagnosis not present

## 2023-09-22 DIAGNOSIS — N189 Chronic kidney disease, unspecified: Secondary | ICD-10-CM | POA: Diagnosis not present

## 2023-09-22 DIAGNOSIS — N400 Enlarged prostate without lower urinary tract symptoms: Secondary | ICD-10-CM | POA: Diagnosis not present

## 2023-09-22 DIAGNOSIS — Z6826 Body mass index (BMI) 26.0-26.9, adult: Secondary | ICD-10-CM | POA: Diagnosis not present

## 2023-09-22 DIAGNOSIS — E1142 Type 2 diabetes mellitus with diabetic polyneuropathy: Secondary | ICD-10-CM | POA: Diagnosis not present

## 2023-09-22 DIAGNOSIS — Z950 Presence of cardiac pacemaker: Secondary | ICD-10-CM | POA: Diagnosis not present

## 2023-09-22 DIAGNOSIS — K219 Gastro-esophageal reflux disease without esophagitis: Secondary | ICD-10-CM | POA: Diagnosis not present

## 2023-09-22 DIAGNOSIS — E66811 Obesity, class 1: Secondary | ICD-10-CM | POA: Diagnosis not present

## 2023-09-22 DIAGNOSIS — R296 Repeated falls: Secondary | ICD-10-CM | POA: Diagnosis not present

## 2023-09-22 DIAGNOSIS — E7800 Pure hypercholesterolemia, unspecified: Secondary | ICD-10-CM | POA: Diagnosis not present

## 2023-09-22 DIAGNOSIS — K589 Irritable bowel syndrome without diarrhea: Secondary | ICD-10-CM | POA: Diagnosis not present

## 2023-09-22 DIAGNOSIS — D631 Anemia in chronic kidney disease: Secondary | ICD-10-CM | POA: Diagnosis not present

## 2023-09-22 DIAGNOSIS — F325 Major depressive disorder, single episode, in full remission: Secondary | ICD-10-CM | POA: Diagnosis not present

## 2023-09-22 DIAGNOSIS — H43819 Vitreous degeneration, unspecified eye: Secondary | ICD-10-CM | POA: Diagnosis not present

## 2023-09-22 DIAGNOSIS — E860 Dehydration: Secondary | ICD-10-CM | POA: Diagnosis not present

## 2023-09-22 DIAGNOSIS — I959 Hypotension, unspecified: Secondary | ICD-10-CM | POA: Diagnosis not present

## 2023-09-22 DIAGNOSIS — Z7984 Long term (current) use of oral hypoglycemic drugs: Secondary | ICD-10-CM | POA: Diagnosis not present

## 2023-09-22 DIAGNOSIS — Z853 Personal history of malignant neoplasm of breast: Secondary | ICD-10-CM | POA: Diagnosis not present

## 2023-09-22 DIAGNOSIS — G5600 Carpal tunnel syndrome, unspecified upper limb: Secondary | ICD-10-CM | POA: Diagnosis not present

## 2023-09-22 DIAGNOSIS — G4700 Insomnia, unspecified: Secondary | ICD-10-CM | POA: Diagnosis not present

## 2023-09-22 DIAGNOSIS — E1122 Type 2 diabetes mellitus with diabetic chronic kidney disease: Secondary | ICD-10-CM | POA: Diagnosis not present

## 2023-09-22 DIAGNOSIS — Z9181 History of falling: Secondary | ICD-10-CM | POA: Diagnosis not present

## 2023-09-22 DIAGNOSIS — I129 Hypertensive chronic kidney disease with stage 1 through stage 4 chronic kidney disease, or unspecified chronic kidney disease: Secondary | ICD-10-CM | POA: Diagnosis not present

## 2023-09-22 DIAGNOSIS — I69354 Hemiplegia and hemiparesis following cerebral infarction affecting left non-dominant side: Secondary | ICD-10-CM | POA: Diagnosis not present

## 2023-09-22 DIAGNOSIS — Z8616 Personal history of COVID-19: Secondary | ICD-10-CM | POA: Diagnosis not present

## 2023-09-23 ENCOUNTER — Telehealth: Payer: Self-pay | Admitting: Urology

## 2023-09-23 ENCOUNTER — Encounter (HOSPITAL_BASED_OUTPATIENT_CLINIC_OR_DEPARTMENT_OTHER): Payer: Self-pay

## 2023-09-23 ENCOUNTER — Other Ambulatory Visit: Payer: Self-pay

## 2023-09-23 ENCOUNTER — Emergency Department (HOSPITAL_BASED_OUTPATIENT_CLINIC_OR_DEPARTMENT_OTHER)
Admission: EM | Admit: 2023-09-23 | Discharge: 2023-09-23 | Disposition: A | Discharge status: Home / Self Care | Attending: Emergency Medicine | Admitting: Emergency Medicine

## 2023-09-23 DIAGNOSIS — T83098A Other mechanical complication of other indwelling urethral catheter, initial encounter: Secondary | ICD-10-CM | POA: Diagnosis not present

## 2023-09-23 DIAGNOSIS — E1122 Type 2 diabetes mellitus with diabetic chronic kidney disease: Secondary | ICD-10-CM | POA: Diagnosis not present

## 2023-09-23 DIAGNOSIS — I509 Heart failure, unspecified: Secondary | ICD-10-CM | POA: Diagnosis not present

## 2023-09-23 DIAGNOSIS — R000 Tachycardia, unspecified: Secondary | ICD-10-CM | POA: Diagnosis not present

## 2023-09-23 DIAGNOSIS — Z7984 Long term (current) use of oral hypoglycemic drugs: Secondary | ICD-10-CM | POA: Diagnosis not present

## 2023-09-23 DIAGNOSIS — Z7901 Long term (current) use of anticoagulants: Secondary | ICD-10-CM | POA: Diagnosis not present

## 2023-09-23 DIAGNOSIS — Z7902 Long term (current) use of antithrombotics/antiplatelets: Secondary | ICD-10-CM | POA: Diagnosis not present

## 2023-09-23 DIAGNOSIS — N189 Chronic kidney disease, unspecified: Secondary | ICD-10-CM | POA: Insufficient documentation

## 2023-09-23 DIAGNOSIS — T83091A Other mechanical complication of indwelling urethral catheter, initial encounter: Secondary | ICD-10-CM | POA: Diagnosis not present

## 2023-09-23 DIAGNOSIS — Z950 Presence of cardiac pacemaker: Secondary | ICD-10-CM | POA: Diagnosis not present

## 2023-09-23 DIAGNOSIS — Y732 Prosthetic and other implants, materials and accessory gastroenterology and urology devices associated with adverse incidents: Secondary | ICD-10-CM | POA: Diagnosis not present

## 2023-09-23 DIAGNOSIS — R339 Retention of urine, unspecified: Secondary | ICD-10-CM | POA: Diagnosis not present

## 2023-09-23 NOTE — Telephone Encounter (Signed)
 Just so you are aware. Patients wife called today to let us  know that patients bladder feels backed up. Wanted to know what to do. I told the wife that he should go to the ER.

## 2023-09-23 NOTE — ED Provider Notes (Signed)
 [place] EMERGENCY DEPARTMENT AT [facility] Provider Note   CSN: [id number] Arrival date & time: 09/23/23  1005     Patient presents with: Urinary Retention   John [name] is a 78 y.o. male.   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  Patient stopped having output through the Foley during early morning hours today.  He has not noticed any changes to his urine recently.  No fevers or vomiting.  He did have recurrent bladder spasms.  Upon flushing Foley here in the ED, drainage resumed and patient's symptoms have resolved.  Current plan is for voiding trial in a couple of weeks.  Patient's Foley was last changed last month.       Prior to Admission medications   Medication Sig Start Date End Date Taking? Authorizing Provider  acetaminophen  (TYLENOL ) 650 MG CR tablet Take 1 tablet (650 mg total) by mouth every 8 (eight) hours as needed for pain. 05/14/23   Zhao, Xika, NP  apixaban  (ELIQUIS ) 5 MG TABS tablet Take 1 tablet (5 mg total) by mouth 2 (two) times daily. 09/14/23   Jobe Mulder, DO  Ascorbic Acid  (VITAMIN C) 1000 MG tablet Take 1,000 mg by mouth 3 (three) times a week.    [provider]  atorvastatin  (LIPITOR ) 80 MG tablet Take 1 tablet (80 mg total) by mouth at bedtime. 09/14/23   Jobe Mulder, DO  clopidogrel  (PLAVIX ) 75 MG tablet Take 1 tablet (75 mg total) by mouth daily with breakfast. *Need appointment for future refills.* 09/13/23   Jobe Mulder, DO  clotrimazole -betamethasone  (LOTRISONE ) cream Apply 1 Application topically daily. 07/11/23   Jobe Mulder, DO  cyanocobalamin  1000 MCG tablet Take 1 tablet (1,000 mcg total) by mouth daily. 09/14/23   Jobe Mulder, DO  escitalopram  (LEXAPRO ) 5 MG tablet Take 1 tablet (5 mg total) by mouth daily. 09/14/23   Jobe Mulder, DO  ferrous sulfate  325 (65 FE) MG tablet Take 1 tablet (325 mg total) by mouth daily with breakfast. 09/14/23   Wendling, Shellie Dials, DO  finasteride  (PROSCAR ) 5 MG tablet Take 1 tablet (5 mg total) by mouth daily. *Need appointment for future refills.* 09/14/23   Wendling, Shellie Dials, DO  hyoscyamine  (LEVSIN  SL) 0.125 MG SL tablet Place 1 tablet (0.125 mg total) under the tongue every 4 (four) hours as needed for bladder spasms. 08/17/23   Angiulli, Everlyn Hockey, PA-C  loperamide  (IMODIUM ) 2 MG capsule Take 1 capsule (2 mg total) by mouth as needed for diarrhea or loose stools. 08/18/23   Love, Renay Carota, PA-C  metFORMIN  (GLUCOPHAGE ) 500 MG tablet Take 1 tablet (500 mg total) by mouth 2 (two) times daily with a meal. 09/14/23   Wendling, Shellie Dials, DO  midodrine  (PROAMATINE ) 5 MG tablet Take 1 tablet (5 mg total) by mouth 3 (three) times daily with meals. *Need appointment for future refills.* 09/14/23   Jobe Mulder, DO  Multiple Vitamin (MULTIVITAMIN) tablet Take 1 tablet by mouth 2 (two) times a week.    [provider]  nystatin  (MYCOSTATIN /NYSTOP ) powder Apply topically 3 (three) times daily. 08/18/23   Love, Renay Carota, PA-C  pantoprazole  (PROTONIX ) 40 MG tablet Take 1 tablet (40 mg total) by mouth at bedtime. 08/17/23   Angiulli,  Everlyn Hockey, PA-C  amLODipine  (NORVASC ) 2.5 MG tablet Take 1 tablet (2.5 mg total) by mouth daily. 06/15/23 06/30/23  Jobe Mulder, DO    Allergies: Ramipril, Other, Sertraline, and Adhesive [tape]    Review of Systems  Updated Vital Signs BP (!) 178/69 (BP Location: Right Arm)   Pulse 82   Temp 98.1 F (36.7 C) (Oral)   Resp 18   Ht 6' (1.829 m)   Wt 88 kg   SpO2 95%   BMI 26.31 kg/m   Physical Exam Vitals and nursing note reviewed.  Constitutional:      Appearance: He is well-developed.  HENT:     Head:  Normocephalic and atraumatic.     Nose: Nose normal.     Mouth/Throat:     Mouth: Mucous membranes are moist.   Eyes:     Conjunctiva/sclera: Conjunctivae normal.   Pulmonary:     Effort: No respiratory distress.  Genitourinary:    Comments: Foley in place.  Glans is erythematous and inflamed, no ulcerations.  No exquisite tenderness.  Scrotum appears normal.  Urethral meatus appears normal.  No bleeding.  Musculoskeletal:     Cervical back: Normal range of motion and neck supple.   Skin:    General: Skin is warm and dry.   Neurological:     Mental Status: He is alert.    ED Course  Patient seen and examined. History obtained directly from patient.  I reviewed outpatient urology notes including office visit from 2 days ago.  Labs/EKG: None ordered  Imaging: None ordered  Medications/Fluids: None ordered  Most recent vital signs reviewed and are as follows: BP (!) 178/69 (BP Location: Right Arm)   Pulse 82   Temp 98.1 F (36.7 C) (Oral)   Resp 18   Ht 6' (1.829 m)   Wt 88 kg   SpO2 95%   BMI 26.31 kg/m   Initial impression: Fully catheter was flushed here with resolution of symptoms and good drainage.  Urology plan is to have the tube removed during a voiding trial in 2 weeks.  This is per urology outpatient notes and per patient report.  At this point, given good function, do not feel that we need to replace the Foley.  Overall no changes in the urine to suggest UTI.  Patient does not have other irritative symptoms now that obstructive process is resolved.  Discussed with Dr. Inga Manges.  Given likelihood that current Foley is colonized, did not think that a UA would be helpful in regards to further treatment.  Plan for d/c to continue outpatient plan.   Considered trying to teach the patient how to flush the Foley in case of obstruction occurs, however he seems less then enthusiastic to do this himself.  He also states similar when talking about the self cath procedure  recommended by urology at recent appointment.  Patient encouraged to return with worsening symptoms.  Patient verbalizes understanding and agrees with plan.     (all labs ordered are listed, but only abnormal results are displayed) Labs Reviewed - No data to display  EKG: None  Radiology: No results found.   Procedures   Medications Ordered in the ED - No data to display                                  Medical Decision Making  Patient with urinary retention, Foley already in place.  Urine  appears clear.  He had temporary obstruction in the catheter today which was cleared with flushing.  Overall low concern for significant UTI.  Plan to have tube removed in 2 weeks.  Patient's symptoms fully resolved after flushing and getting good drainage from the catheter.  Plan as above.     Final diagnoses:  Obstruction of Foley catheter, initial encounter [facility])    ED Discharge Orders     None          Lyna Sandhoff, PA-C 09/23/23 1104    Albertus Hughs, DO 09/23/23 1115

## 2023-09-23 NOTE — ED Notes (Signed)
 Bladder scanned twice, 0mL both times. Pt denies pain or pressure sensation anymore.

## 2023-09-23 NOTE — Discharge Instructions (Signed)
 Please follow-up with your urologist as planned.  Return if the Foley catheter becomes obstructed again or you develop visible bleeding or cloudiness of the urine which is causing a sluggish urine flow.  Return with fever, worsening abdominal pain or flank pain.

## 2023-09-23 NOTE — ED Notes (Signed)
 Pt had a recessed penis surrounded by adipose tissue. I pulled back his foreskin which was stuck to his glans, and revealed a 8-83mm sized red sore on the anterior glans, tender to touch. Significant smegma around penis. Pt advised his wife cleans him and we reinforced he needs to make sure he's pulling back the skin and cleaning well. Catheter moves freely in the urethra, but as soon as I pushed it superiorly, the pt began to pee past the tube/bulb. Estimated up to 200cc of urine while I cleaned him.   Light gray and yellow mucous around the catheter and inside the urethra, with strong odor. Skin care and cleaning took place and foreskin was returned to position of comfort for patient. Catheter seems too small for urethra, but prior RN remembers difficulty passing the catheter.   Flushed catheter with 20cc and heard a pop before flow, no pain with flushing. Immediate drainage noted to new catch bag.

## 2023-09-23 NOTE — ED Triage Notes (Signed)
 Pt arrives from home and reports that he hasn't been able to void since 5 am. States that the emptied his foley in for 5-6 weeks. Reports pain/ spasms every few moments.

## 2023-09-23 NOTE — ED Notes (Signed)

## 2023-09-26 DIAGNOSIS — F325 Major depressive disorder, single episode, in full remission: Secondary | ICD-10-CM | POA: Diagnosis not present

## 2023-09-26 DIAGNOSIS — E43 Unspecified severe protein-calorie malnutrition: Secondary | ICD-10-CM | POA: Diagnosis not present

## 2023-09-26 DIAGNOSIS — Z6826 Body mass index (BMI) 26.0-26.9, adult: Secondary | ICD-10-CM | POA: Diagnosis not present

## 2023-09-26 DIAGNOSIS — G4700 Insomnia, unspecified: Secondary | ICD-10-CM | POA: Diagnosis not present

## 2023-09-26 DIAGNOSIS — E7800 Pure hypercholesterolemia, unspecified: Secondary | ICD-10-CM | POA: Diagnosis not present

## 2023-09-26 DIAGNOSIS — E66811 Obesity, class 1: Secondary | ICD-10-CM | POA: Diagnosis not present

## 2023-09-26 DIAGNOSIS — I69354 Hemiplegia and hemiparesis following cerebral infarction affecting left non-dominant side: Secondary | ICD-10-CM | POA: Diagnosis not present

## 2023-09-26 DIAGNOSIS — I959 Hypotension, unspecified: Secondary | ICD-10-CM | POA: Diagnosis not present

## 2023-09-26 DIAGNOSIS — Z9181 History of falling: Secondary | ICD-10-CM | POA: Diagnosis not present

## 2023-09-26 DIAGNOSIS — Z8616 Personal history of COVID-19: Secondary | ICD-10-CM | POA: Diagnosis not present

## 2023-09-26 DIAGNOSIS — I129 Hypertensive chronic kidney disease with stage 1 through stage 4 chronic kidney disease, or unspecified chronic kidney disease: Secondary | ICD-10-CM | POA: Diagnosis not present

## 2023-09-26 DIAGNOSIS — K219 Gastro-esophageal reflux disease without esophagitis: Secondary | ICD-10-CM | POA: Diagnosis not present

## 2023-09-26 DIAGNOSIS — Z853 Personal history of malignant neoplasm of breast: Secondary | ICD-10-CM | POA: Diagnosis not present

## 2023-09-26 DIAGNOSIS — D631 Anemia in chronic kidney disease: Secondary | ICD-10-CM | POA: Diagnosis not present

## 2023-09-26 DIAGNOSIS — Z7984 Long term (current) use of oral hypoglycemic drugs: Secondary | ICD-10-CM | POA: Diagnosis not present

## 2023-09-26 DIAGNOSIS — H43819 Vitreous degeneration, unspecified eye: Secondary | ICD-10-CM | POA: Diagnosis not present

## 2023-09-26 DIAGNOSIS — G5600 Carpal tunnel syndrome, unspecified upper limb: Secondary | ICD-10-CM | POA: Diagnosis not present

## 2023-09-26 DIAGNOSIS — R296 Repeated falls: Secondary | ICD-10-CM | POA: Diagnosis not present

## 2023-09-26 DIAGNOSIS — E860 Dehydration: Secondary | ICD-10-CM | POA: Diagnosis not present

## 2023-09-26 DIAGNOSIS — Z950 Presence of cardiac pacemaker: Secondary | ICD-10-CM | POA: Diagnosis not present

## 2023-09-26 DIAGNOSIS — E1142 Type 2 diabetes mellitus with diabetic polyneuropathy: Secondary | ICD-10-CM | POA: Diagnosis not present

## 2023-09-26 DIAGNOSIS — K589 Irritable bowel syndrome without diarrhea: Secondary | ICD-10-CM | POA: Diagnosis not present

## 2023-09-26 DIAGNOSIS — N189 Chronic kidney disease, unspecified: Secondary | ICD-10-CM | POA: Diagnosis not present

## 2023-09-26 DIAGNOSIS — E1122 Type 2 diabetes mellitus with diabetic chronic kidney disease: Secondary | ICD-10-CM | POA: Diagnosis not present

## 2023-09-26 DIAGNOSIS — N400 Enlarged prostate without lower urinary tract symptoms: Secondary | ICD-10-CM | POA: Diagnosis not present

## 2023-09-27 ENCOUNTER — Telehealth: Payer: Self-pay | Admitting: Urology

## 2023-09-27 ENCOUNTER — Other Ambulatory Visit: Payer: Self-pay

## 2023-09-27 ENCOUNTER — Telehealth: Payer: Self-pay

## 2023-09-27 ENCOUNTER — Encounter (HOSPITAL_BASED_OUTPATIENT_CLINIC_OR_DEPARTMENT_OTHER): Payer: Self-pay

## 2023-09-27 ENCOUNTER — Emergency Department (HOSPITAL_BASED_OUTPATIENT_CLINIC_OR_DEPARTMENT_OTHER)
Admission: EM | Admit: 2023-09-27 | Discharge: 2023-09-27 | Disposition: A | Discharge status: Home / Self Care | Attending: Emergency Medicine | Admitting: Emergency Medicine

## 2023-09-27 DIAGNOSIS — N400 Enlarged prostate without lower urinary tract symptoms: Secondary | ICD-10-CM | POA: Diagnosis not present

## 2023-09-27 DIAGNOSIS — N189 Chronic kidney disease, unspecified: Secondary | ICD-10-CM | POA: Diagnosis not present

## 2023-09-27 DIAGNOSIS — T8389XA Other specified complication of genitourinary prosthetic devices, implants and grafts, initial encounter: Secondary | ICD-10-CM | POA: Insufficient documentation

## 2023-09-27 DIAGNOSIS — I959 Hypotension, unspecified: Secondary | ICD-10-CM | POA: Diagnosis not present

## 2023-09-27 DIAGNOSIS — Z7901 Long term (current) use of anticoagulants: Secondary | ICD-10-CM | POA: Insufficient documentation

## 2023-09-27 DIAGNOSIS — G4700 Insomnia, unspecified: Secondary | ICD-10-CM | POA: Diagnosis not present

## 2023-09-27 DIAGNOSIS — K219 Gastro-esophageal reflux disease without esophagitis: Secondary | ICD-10-CM | POA: Diagnosis not present

## 2023-09-27 DIAGNOSIS — D631 Anemia in chronic kidney disease: Secondary | ICD-10-CM | POA: Diagnosis not present

## 2023-09-27 DIAGNOSIS — I129 Hypertensive chronic kidney disease with stage 1 through stage 4 chronic kidney disease, or unspecified chronic kidney disease: Secondary | ICD-10-CM | POA: Diagnosis not present

## 2023-09-27 DIAGNOSIS — I69354 Hemiplegia and hemiparesis following cerebral infarction affecting left non-dominant side: Secondary | ICD-10-CM | POA: Diagnosis not present

## 2023-09-27 DIAGNOSIS — R296 Repeated falls: Secondary | ICD-10-CM | POA: Diagnosis not present

## 2023-09-27 DIAGNOSIS — E860 Dehydration: Secondary | ICD-10-CM | POA: Diagnosis not present

## 2023-09-27 DIAGNOSIS — G5600 Carpal tunnel syndrome, unspecified upper limb: Secondary | ICD-10-CM | POA: Diagnosis not present

## 2023-09-27 DIAGNOSIS — E7800 Pure hypercholesterolemia, unspecified: Secondary | ICD-10-CM | POA: Diagnosis not present

## 2023-09-27 DIAGNOSIS — Y732 Prosthetic and other implants, materials and accessory gastroenterology and urology devices associated with adverse incidents: Secondary | ICD-10-CM | POA: Diagnosis not present

## 2023-09-27 DIAGNOSIS — Z853 Personal history of malignant neoplasm of breast: Secondary | ICD-10-CM | POA: Diagnosis not present

## 2023-09-27 DIAGNOSIS — K589 Irritable bowel syndrome without diarrhea: Secondary | ICD-10-CM | POA: Diagnosis not present

## 2023-09-27 DIAGNOSIS — H43819 Vitreous degeneration, unspecified eye: Secondary | ICD-10-CM | POA: Diagnosis not present

## 2023-09-27 DIAGNOSIS — Z950 Presence of cardiac pacemaker: Secondary | ICD-10-CM | POA: Diagnosis not present

## 2023-09-27 DIAGNOSIS — T839XXA Unspecified complication of genitourinary prosthetic device, implant and graft, initial encounter: Secondary | ICD-10-CM

## 2023-09-27 DIAGNOSIS — T83091A Other mechanical complication of indwelling urethral catheter, initial encounter: Secondary | ICD-10-CM | POA: Diagnosis not present

## 2023-09-27 DIAGNOSIS — Z7984 Long term (current) use of oral hypoglycemic drugs: Secondary | ICD-10-CM | POA: Diagnosis not present

## 2023-09-27 DIAGNOSIS — E43 Unspecified severe protein-calorie malnutrition: Secondary | ICD-10-CM | POA: Diagnosis not present

## 2023-09-27 DIAGNOSIS — E1122 Type 2 diabetes mellitus with diabetic chronic kidney disease: Secondary | ICD-10-CM | POA: Diagnosis not present

## 2023-09-27 DIAGNOSIS — Z8616 Personal history of COVID-19: Secondary | ICD-10-CM | POA: Diagnosis not present

## 2023-09-27 DIAGNOSIS — E1142 Type 2 diabetes mellitus with diabetic polyneuropathy: Secondary | ICD-10-CM | POA: Diagnosis not present

## 2023-09-27 DIAGNOSIS — Z6826 Body mass index (BMI) 26.0-26.9, adult: Secondary | ICD-10-CM | POA: Diagnosis not present

## 2023-09-27 DIAGNOSIS — Z9181 History of falling: Secondary | ICD-10-CM | POA: Diagnosis not present

## 2023-09-27 DIAGNOSIS — E66811 Obesity, class 1: Secondary | ICD-10-CM | POA: Diagnosis not present

## 2023-09-27 DIAGNOSIS — F325 Major depressive disorder, single episode, in full remission: Secondary | ICD-10-CM | POA: Diagnosis not present

## 2023-09-27 HISTORY — DX: Cerebral infarction, unspecified: I63.9

## 2023-09-27 MED ORDER — LIDOCAINE HCL URETHRAL/MUCOSAL 2 % EX GEL
1.0000 | Freq: Once | CUTANEOUS | Status: AC
  Administered 2023-09-27: 1 via URETHRAL
  Filled 2023-09-27: qty 11

## 2023-09-27 NOTE — ED Notes (Signed)
 Pt started having spasms approx 0430 and catheter has not been draining thru the night.  Pt was seen here Friday fro the same.  Catheter has not been changed in 6-8 weeks.

## 2023-09-27 NOTE — ED Triage Notes (Addendum)
 Pt was seen here on Friday for the same.   Pt states catheter has not drained since  Woke up at 0430 with spasm like pain Also reports catheter has not been changed in 6 weeks or more Bladder scan 

## 2023-09-27 NOTE — Telephone Encounter (Signed)
 Copied from CRM [id number]. Topic: Clinical - Home Health Verbal Orders >> Sep 27, 2023  1:59 PM Adonis Hoot wrote: Caller/Agency: [place] Callback Number: [phone] Service Requested: Skilled Nursing Frequency: 1wk9 Education of care of foley catheter insertion, Exacerbation, Medication administration -Recommend PT  and OT Any new concerns about the patient? No  Tried call number back for [place] NO VM to leave message, tried calling twice.To give verbal order. [facility]    Callback Number: [phone]    Service Requested: Skilled Nursing    Frequency: 1wk9    Education of care of foley catheter insertion,

## 2023-09-27 NOTE — Telephone Encounter (Signed)
 Pt wife called stated the ER here at [address] wanted you to review there notes from his visits on 09/23/23 and 09/27/23. Pt is scheduled 10/05/23 for voiding trail and pt wife stated they removed the cath and put a new on back in. Pt was able to void without it but they did insert a new cath. Please advise on what your next steps would be.

## 2023-09-27 NOTE — ED Provider Notes (Signed)
  EMERGENCY DEPARTMENT AT [facility] Provider Note   CSN: [id number] Arrival date & time: 09/27/23  2841     Patient presents with: catheter not draining   John [name] is a 78 y.o. male.   The patient is a 78 year old male presenting with an occluded Foley catheter.  This has been indwelling for the past 6 weeks.  Patient states no urine output since 430 this morning and he feels distended and very uncomfortable.       Prior to Admission medications   Medication Sig Start Date End Date Taking? Authorizing Provider  acetaminophen  (TYLENOL ) 650 MG CR tablet Take 1 tablet (650 mg total) by mouth every 8 (eight) hours as needed for pain. 05/14/23   Zhao, Xika, NP  apixaban  (ELIQUIS ) 5 MG TABS tablet Take 1 tablet (5 mg total) by mouth 2 (two) times daily. 09/14/23   Jobe Mulder, DO  Ascorbic Acid  (VITAMIN C) 1000 MG tablet Take 1,000 mg by mouth 3 (three) times a week.    [provider]  atorvastatin  (LIPITOR ) 80 MG tablet Take 1 tablet (80 mg total) by mouth at bedtime. 09/14/23   Jobe Mulder, DO  clopidogrel  (PLAVIX ) 75 MG tablet Take 1 tablet (75 mg total) by mouth daily with breakfast. *Need appointment for future refills.* 09/13/23   Jobe Mulder, DO  clotrimazole -betamethasone  (LOTRISONE ) cream Apply 1 Application topically daily. 07/11/23   Jobe Mulder, DO  cyanocobalamin  1000 MCG tablet Take 1 tablet (1,000 mcg total) by mouth daily. 09/14/23   Jobe Mulder, DO  escitalopram  (LEXAPRO ) 5 MG tablet Take 1 tablet (5 mg total) by mouth daily. 09/14/23   Jobe Mulder, DO  ferrous sulfate  325 (65 FE) MG tablet Take 1 tablet (325 mg total) by mouth daily with breakfast. 09/14/23   Gwenette Lennox, Shellie Dials, DO  finasteride  (PROSCAR ) 5 MG tablet Take 1 tablet (5 mg total) by mouth daily. *Need appointment for future refills.* 09/14/23   Wendling, Shellie Dials, DO  hyoscyamine  (LEVSIN  SL) 0.125 MG SL  tablet Place 1 tablet (0.125 mg total) under the tongue every 4 (four) hours as needed for bladder spasms. 08/17/23   Angiulli, Everlyn Hockey, PA-C  loperamide  (IMODIUM ) 2 MG capsule Take 1 capsule (2 mg total) by mouth as needed for diarrhea or loose stools. 08/18/23   Love, Renay Carota, PA-C  metFORMIN  (GLUCOPHAGE ) 500 MG tablet Take 1 tablet (500 mg total) by mouth 2 (two) times daily with a meal. 09/14/23   Wendling, Shellie Dials, DO  midodrine  (PROAMATINE ) 5 MG tablet Take 1 tablet (5 mg total) by mouth 3 (three) times daily with meals. *Need appointment for future refills.* 09/14/23   Jobe Mulder, DO  Multiple Vitamin (MULTIVITAMIN) tablet Take 1 tablet by mouth 2 (two) times a week.    [provider]  nystatin  (MYCOSTATIN /NYSTOP ) powder Apply topically 3 (three) times daily. 08/18/23   Love, Renay Carota, PA-C  pantoprazole  (PROTONIX ) 40 MG tablet Take 1 tablet (40 mg total) by mouth at bedtime. 08/17/23   Angiulli, Everlyn Hockey, PA-C  amLODipine  (NORVASC ) 2.5 MG tablet Take 1 tablet (2.5 mg total) by mouth daily. 06/15/23 06/30/23  Jobe Mulder, DO    Allergies: Ramipril, Other, Sertraline, and Adhesive [tape]    Review of Systems  All other systems reviewed and are negative.   Updated Vital Signs BP (!) 174/78   Pulse (!) 102   Temp 97.7 F (36.5 C) (Oral)   Resp 18  Ht 6' (1.829 m)   Wt 87 kg   SpO2 100%   BMI 26.01 kg/m   Physical Exam Vitals and nursing note reviewed.  Constitutional:      Appearance: Normal appearance.  Pulmonary:     Effort: Pulmonary effort is normal.   Skin:    General: Skin is warm and dry.   Neurological:     Mental Status: He is alert and oriented to person, place, and time.     (all labs ordered are listed, but only abnormal results are displayed) Labs Reviewed - No data to display  EKG: None  Radiology: No results found.   Procedures   Medications Ordered in the ED  lidocaine  (XYLOCAINE ) 2 % jelly 1 Application (has no  administration in time range)                                    Medical Decision Making  Patient is a 78 year old male presenting with urinary retention/plugged foley catheter.  Catheter was replaced and is now draining and patient's symptoms have been relieved.  He will be discharged with follow-up with his urologist.     Final diagnoses:  None    ED Discharge Orders     None          Orvilla Blander, MD 09/27/23 [phone]

## 2023-09-27 NOTE — Discharge Instructions (Signed)
 Follow-up with Dr. Joie Narrow in the next 1 to 2 days, and return to the ER if you develop any new and/or concerning issues.

## 2023-09-28 DIAGNOSIS — N400 Enlarged prostate without lower urinary tract symptoms: Secondary | ICD-10-CM | POA: Diagnosis not present

## 2023-09-28 DIAGNOSIS — G4700 Insomnia, unspecified: Secondary | ICD-10-CM | POA: Diagnosis not present

## 2023-09-28 DIAGNOSIS — Z7984 Long term (current) use of oral hypoglycemic drugs: Secondary | ICD-10-CM | POA: Diagnosis not present

## 2023-09-28 DIAGNOSIS — H43819 Vitreous degeneration, unspecified eye: Secondary | ICD-10-CM | POA: Diagnosis not present

## 2023-09-28 DIAGNOSIS — E1122 Type 2 diabetes mellitus with diabetic chronic kidney disease: Secondary | ICD-10-CM | POA: Diagnosis not present

## 2023-09-28 DIAGNOSIS — I69354 Hemiplegia and hemiparesis following cerebral infarction affecting left non-dominant side: Secondary | ICD-10-CM | POA: Diagnosis not present

## 2023-09-28 DIAGNOSIS — N189 Chronic kidney disease, unspecified: Secondary | ICD-10-CM | POA: Diagnosis not present

## 2023-09-28 DIAGNOSIS — E66811 Obesity, class 1: Secondary | ICD-10-CM | POA: Diagnosis not present

## 2023-09-28 DIAGNOSIS — Z950 Presence of cardiac pacemaker: Secondary | ICD-10-CM | POA: Diagnosis not present

## 2023-09-28 DIAGNOSIS — Z6826 Body mass index (BMI) 26.0-26.9, adult: Secondary | ICD-10-CM | POA: Diagnosis not present

## 2023-09-28 DIAGNOSIS — K219 Gastro-esophageal reflux disease without esophagitis: Secondary | ICD-10-CM | POA: Diagnosis not present

## 2023-09-28 DIAGNOSIS — I129 Hypertensive chronic kidney disease with stage 1 through stage 4 chronic kidney disease, or unspecified chronic kidney disease: Secondary | ICD-10-CM | POA: Diagnosis not present

## 2023-09-28 DIAGNOSIS — D631 Anemia in chronic kidney disease: Secondary | ICD-10-CM | POA: Diagnosis not present

## 2023-09-28 DIAGNOSIS — E860 Dehydration: Secondary | ICD-10-CM | POA: Diagnosis not present

## 2023-09-28 DIAGNOSIS — Z8616 Personal history of COVID-19: Secondary | ICD-10-CM | POA: Diagnosis not present

## 2023-09-28 DIAGNOSIS — Z853 Personal history of malignant neoplasm of breast: Secondary | ICD-10-CM | POA: Diagnosis not present

## 2023-09-28 DIAGNOSIS — F325 Major depressive disorder, single episode, in full remission: Secondary | ICD-10-CM | POA: Diagnosis not present

## 2023-09-28 DIAGNOSIS — E7800 Pure hypercholesterolemia, unspecified: Secondary | ICD-10-CM | POA: Diagnosis not present

## 2023-09-28 DIAGNOSIS — I959 Hypotension, unspecified: Secondary | ICD-10-CM | POA: Diagnosis not present

## 2023-09-28 DIAGNOSIS — Z9181 History of falling: Secondary | ICD-10-CM | POA: Diagnosis not present

## 2023-09-28 DIAGNOSIS — R296 Repeated falls: Secondary | ICD-10-CM | POA: Diagnosis not present

## 2023-09-28 DIAGNOSIS — E1142 Type 2 diabetes mellitus with diabetic polyneuropathy: Secondary | ICD-10-CM | POA: Diagnosis not present

## 2023-09-28 DIAGNOSIS — G5600 Carpal tunnel syndrome, unspecified upper limb: Secondary | ICD-10-CM | POA: Diagnosis not present

## 2023-09-28 DIAGNOSIS — K589 Irritable bowel syndrome without diarrhea: Secondary | ICD-10-CM | POA: Diagnosis not present

## 2023-09-28 DIAGNOSIS — E43 Unspecified severe protein-calorie malnutrition: Secondary | ICD-10-CM | POA: Diagnosis not present

## 2023-09-29 ENCOUNTER — Ambulatory Visit (INDEPENDENT_AMBULATORY_CARE_PROVIDER_SITE_OTHER): Payer: Medicare Other

## 2023-09-29 DIAGNOSIS — E7800 Pure hypercholesterolemia, unspecified: Secondary | ICD-10-CM | POA: Diagnosis not present

## 2023-09-29 DIAGNOSIS — E66811 Obesity, class 1: Secondary | ICD-10-CM | POA: Diagnosis not present

## 2023-09-29 DIAGNOSIS — Z6826 Body mass index (BMI) 26.0-26.9, adult: Secondary | ICD-10-CM | POA: Diagnosis not present

## 2023-09-29 DIAGNOSIS — K219 Gastro-esophageal reflux disease without esophagitis: Secondary | ICD-10-CM | POA: Diagnosis not present

## 2023-09-29 DIAGNOSIS — K589 Irritable bowel syndrome without diarrhea: Secondary | ICD-10-CM | POA: Diagnosis not present

## 2023-09-29 DIAGNOSIS — Z950 Presence of cardiac pacemaker: Secondary | ICD-10-CM | POA: Diagnosis not present

## 2023-09-29 DIAGNOSIS — N189 Chronic kidney disease, unspecified: Secondary | ICD-10-CM | POA: Diagnosis not present

## 2023-09-29 DIAGNOSIS — I959 Hypotension, unspecified: Secondary | ICD-10-CM | POA: Diagnosis not present

## 2023-09-29 DIAGNOSIS — G4700 Insomnia, unspecified: Secondary | ICD-10-CM | POA: Diagnosis not present

## 2023-09-29 DIAGNOSIS — I69354 Hemiplegia and hemiparesis following cerebral infarction affecting left non-dominant side: Secondary | ICD-10-CM | POA: Diagnosis not present

## 2023-09-29 DIAGNOSIS — Z853 Personal history of malignant neoplasm of breast: Secondary | ICD-10-CM | POA: Diagnosis not present

## 2023-09-29 DIAGNOSIS — E1122 Type 2 diabetes mellitus with diabetic chronic kidney disease: Secondary | ICD-10-CM | POA: Diagnosis not present

## 2023-09-29 DIAGNOSIS — Z7901 Long term (current) use of anticoagulants: Secondary | ICD-10-CM | POA: Diagnosis not present

## 2023-09-29 DIAGNOSIS — G5600 Carpal tunnel syndrome, unspecified upper limb: Secondary | ICD-10-CM | POA: Diagnosis not present

## 2023-09-29 DIAGNOSIS — I442 Atrioventricular block, complete: Secondary | ICD-10-CM

## 2023-09-29 DIAGNOSIS — Z7902 Long term (current) use of antithrombotics/antiplatelets: Secondary | ICD-10-CM | POA: Diagnosis not present

## 2023-09-29 DIAGNOSIS — R296 Repeated falls: Secondary | ICD-10-CM | POA: Diagnosis not present

## 2023-09-29 DIAGNOSIS — E43 Unspecified severe protein-calorie malnutrition: Secondary | ICD-10-CM | POA: Diagnosis not present

## 2023-09-29 DIAGNOSIS — Z8616 Personal history of COVID-19: Secondary | ICD-10-CM | POA: Diagnosis not present

## 2023-09-29 DIAGNOSIS — E1142 Type 2 diabetes mellitus with diabetic polyneuropathy: Secondary | ICD-10-CM | POA: Diagnosis not present

## 2023-09-29 DIAGNOSIS — D631 Anemia in chronic kidney disease: Secondary | ICD-10-CM | POA: Diagnosis not present

## 2023-09-29 DIAGNOSIS — I129 Hypertensive chronic kidney disease with stage 1 through stage 4 chronic kidney disease, or unspecified chronic kidney disease: Secondary | ICD-10-CM | POA: Diagnosis not present

## 2023-09-29 DIAGNOSIS — F325 Major depressive disorder, single episode, in full remission: Secondary | ICD-10-CM | POA: Diagnosis not present

## 2023-09-29 DIAGNOSIS — H43819 Vitreous degeneration, unspecified eye: Secondary | ICD-10-CM | POA: Diagnosis not present

## 2023-09-29 DIAGNOSIS — N400 Enlarged prostate without lower urinary tract symptoms: Secondary | ICD-10-CM | POA: Diagnosis not present

## 2023-09-29 DIAGNOSIS — Z7984 Long term (current) use of oral hypoglycemic drugs: Secondary | ICD-10-CM | POA: Diagnosis not present

## 2023-09-29 LAB — CUP PACEART REMOTE DEVICE CHECK
Battery Remaining Longevity: 125 mo
Battery Remaining Percentage: 95.5 %
Battery Voltage: 3.01 V
Brady Statistic AP VP Percent: 1 %
Brady Statistic AP VS Percent: 5.2 %
Brady Statistic AS VP Percent: 1 %
Brady Statistic AS VS Percent: 94 %
Brady Statistic RA Percent Paced: 4.5 %
Brady Statistic RV Percent Paced: 1 %
Date Time Interrogation Session: 20250619071002
Implantable Lead Connection Status: 753985
Implantable Lead Connection Status: 753985
Implantable Lead Implant Date: 20250131
Implantable Lead Implant Date: 20250131
Implantable Lead Location: 753859
Implantable Lead Location: 753860
Implantable Pulse Generator Implant Date: 20250131
Lead Channel Impedance Value: 400 Ohm
Lead Channel Impedance Value: 410 Ohm
Lead Channel Pacing Threshold Amplitude: 0.75 V
Lead Channel Pacing Threshold Amplitude: 0.75 V
Lead Channel Pacing Threshold Pulse Width: 0.5 ms
Lead Channel Pacing Threshold Pulse Width: 0.5 ms
Lead Channel Sensing Intrinsic Amplitude: 2.4 mV
Lead Channel Sensing Intrinsic Amplitude: 3.6 mV
Lead Channel Setting Pacing Amplitude: 1 V
Lead Channel Setting Pacing Amplitude: 2 V
Lead Channel Setting Pacing Pulse Width: 0.5 ms
Lead Channel Setting Sensing Sensitivity: 0.5 mV
Pulse Gen Model: 2272
Pulse Gen Serial Number: 8241762

## 2023-09-30 ENCOUNTER — Other Ambulatory Visit (HOSPITAL_BASED_OUTPATIENT_CLINIC_OR_DEPARTMENT_OTHER): Payer: Self-pay

## 2023-09-30 ENCOUNTER — Ambulatory Visit: Attending: Physician Assistant | Admitting: Physician Assistant

## 2023-09-30 ENCOUNTER — Encounter: Payer: Self-pay | Admitting: Physician Assistant

## 2023-09-30 VITALS — BP 100/48 | HR 90 | Resp 16 | Ht 72.0 in | Wt 196.8 lb

## 2023-09-30 DIAGNOSIS — Z950 Presence of cardiac pacemaker: Secondary | ICD-10-CM

## 2023-09-30 DIAGNOSIS — I951 Orthostatic hypotension: Secondary | ICD-10-CM

## 2023-09-30 DIAGNOSIS — I2699 Other pulmonary embolism without acute cor pulmonale: Secondary | ICD-10-CM | POA: Diagnosis not present

## 2023-09-30 DIAGNOSIS — Z8673 Personal history of transient ischemic attack (TIA), and cerebral infarction without residual deficits: Secondary | ICD-10-CM

## 2023-09-30 DIAGNOSIS — E785 Hyperlipidemia, unspecified: Secondary | ICD-10-CM

## 2023-09-30 DIAGNOSIS — I2510 Atherosclerotic heart disease of native coronary artery without angina pectoris: Secondary | ICD-10-CM | POA: Diagnosis not present

## 2023-09-30 DIAGNOSIS — I420 Dilated cardiomyopathy: Secondary | ICD-10-CM

## 2023-09-30 MED ORDER — MIDODRINE HCL 10 MG PO TABS
10.0000 mg | ORAL_TABLET | Freq: Three times a day (TID) | ORAL | 3 refills | Status: DC
  Filled 2023-09-30: qty 90, 30d supply, fill #0
  Filled 2023-11-14: qty 90, 30d supply, fill #1

## 2023-09-30 NOTE — Progress Notes (Unsigned)
 Cardiology Office Note   Date:  09/30/2023  ID:  Harlin, Mazzoni 13-Jul-1945, MRN [id number] PCP: [name], DO  [facility] Providers Cardiologist:  Redell Shallow, MD Electrophysiologist:  Fonda Kitty, MD { Click to update primary MD,subspecialty MD or APP then REFRESH:1}    History of Present Illness John [name] is a 78 y.o. male with past medical history of hypertension, hepatic steatosis, history of breast cancer status post a right total mastectomy on tamoxifen  2020, CVA and DM2.  Patient was seen remotely in 2016 by [facility] cardiology service and underwent echocardiogram that showed normal EF and the Holter monitor that showed isolated PVCs.  He also had a stress test around that time that was normal.  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  He was also recently diagnosed with small-volume occlusive and nonocclusive PE of the right lung without evidence of right heart strain.  Lower extremity venous Doppler showed acute DVT involving the left common femoral vein.  He was started on Eliquis .  MRI showed punctate acute infarct of the right middle cerebellar pedicle and a remote lacunar infarct.  This was evaluated by neurology team and found to be nonacute.  During the hospitalization, he was found to have orthostatic hypotension that was likely the etiology behind the recent fall and syncope.  Cardiology service was involved in January hospitalization due to abrupt onset of severe bradycardia associated with hypotension.  Telemetry showed wide-complex rhythm at 29 bpm consistent with idioventricular rhythm.  Echocardiogram on 05/10/2023 showed EF 60 to 65%, no regional wall motion abnormality, normal RV, mildly elevated PASP at 38 mmHg, no significant valve issue.  He underwent successful temporary pacemaker implantation [facility] on 05/11/2023.  He was seen by EP and eventually underwent permanent pacemaker  secondary to intermittent complete heart block.  He was discharged on 05/14/2023 to resume Eliquis .    He returned back to the hospital in late April 2025 with complaint of fever and found to have altered mental status and mechanical fall.  Lactic acid was 7.1.  Blood culture showed E. coli.  Workup revealed cholelithiasis and gallbladder sludge on the right quadrant ultrasound.  He was evaluated by urology service for indwelling Foley and a complicated UTI.  He underwent ultrasound and HIDA scan with gallstone, gallbladder sludge and mild gallbladder wall thickening, EF 40%.  General surgery was consulted and recommended to proceed with laparoscopic cholecystectomy.  In the afternoon of the intended surgery, he had an episode of severe hypotension after he became weak and clammy.  High-sensitivity troponin was 767.  Cardiology service was called to evaluate the patient for preoperative clearance.  Given elevation of the troponin, it was recommended to proceed with cardiac catheterization for definitive evaluation.  Cardiac catheterization performed on 08/10/2023 showed 70% OM 3 lesion, 95% proximal nondominant RCA lesion, 80% RPDA lesion, 95% mid LAD lesion treated with 2.5 x 28 mm Synergy DES, 40% distal LAD lesion, 80% lateral D1 lesion.  It was recommended to postpone his cholecystectomy surgery given the placement of new stent.  It was recommended to stop aspirin  after patient restarted on DOAC and continue Plavix  for minimum 6 months.  His device interrogation of his pacemaker was concerning for under sensing leading to PMT, his device was adjusted during the hospitalization. Echocardiogram obtained on 08/11/2023 showed EF 30 to 35%, akinesis of the apical inferior/anterior and lateral wall, akinesis of the true apex, RVSP 41.9 mmHg, mild  AI.  There is some concern that significant drop in ejection fracture is related to myocardial stunning.  He was started on carvedilol , spironolactone  and Jardiance .  He cannot  take ARB/Arni due to history of anaphylaxis with ramipril.He was subsequently discharged to inpatient rehab.  Cardiology was consulted during inpatient rehab due to significant orthostatic hypotension.  Carvedilol  and spironolactone  were discontinued.  Systolic blood pressure still dropped down to the 70s despite discontinuation of carvedilol .  Systolic blood pressure was dropping from 120 down to 70s upon standing.  Since discharge, patient has returned back to the ED on 08/30/2023 due to dizziness and lightheadedness.  It was noted he had 30 mmHg drop upon standing.  Lactic acid was 2.1 at the time.  He was placed on midodrine  5 mg 3 times a day.  Patient was recently seen by Dr. Kennyth on 09/09/2023 his device was interrogated in the office and showed stable device function and lead parameter.  Patient wished to establish with Dr. Ladona.  Patient presents today for follow-up.  Blood pressure remain borderline low.  I will increase midodrine  to 10 mg 3 times daily dosing.  He is currently off of all heart failure medications due to soft blood pressure and the significant orthostatic hypotension.  Family noted he has to stand for a few minutes upon standing due to the drop in the blood pressure.  He denies significant dizziness in the office.  Manual blood pressure check by myself in the right arm was 98/48, left arm pressure was 104/54.  I recommend a limited echocardiogram in August prior to 21-month follow-up with Dr. Ladona.  Note, based on previous note from Dr. Kennyth, patient wished to switch from Dr. Pietro to Dr. Ladona mainly because he is wife is also a patient of Dr. Godfrey.  I asked the patient to double check with his PCP in regard to the duration of Eliquis .  Since the PE was diagnosed on 05/09/2023, I suspect patient can come off of Eliquis  starting on 11/06/2023 once he completed 83-month course.    ROS: ***  Studies Reviewed      *** Risk Assessment/Calculations {Does this patient have  ATRIAL FIBRILLATION?:[phone]}         Physical Exam VS:  BP (!) 100/48 (BP Location: Left Arm, Patient Position: Sitting, Cuff Size: Normal)   Pulse 90   Resp 16   Ht 6' (1.829 m)   Wt 196 lb 12.8 oz (89.3 kg)   SpO2 97%   BMI 26.69 kg/m        Wt Readings from Last 3 Encounters:  09/30/23 196 lb 12.8 oz (89.3 kg)  09/27/23 191 lb 12.8 oz (87 kg)  09/23/23 194 lb (88 kg)    GEN: Well nourished, well developed in no acute distress NECK: No JVD; No carotid bruits CARDIAC: ***RRR, no murmurs, rubs, gallops RESPIRATORY:  Clear to auscultation without rales, wheezing or rhonchi  ABDOMEN: Soft, non-tender, non-distended EXTREMITIES:  No edema; No deformity   ASSESSMENT AND PLAN ***    {Are you ordering a CV Procedure (e.g. stress test, cath, DCCV, TEE, etc)?   Press F2        :[id number]}  Dispo: ***  Signed, Scot Ford, PA

## 2023-09-30 NOTE — Telephone Encounter (Signed)
 I'll try and get him in today to teach him how to flush his cath. Im still the only one here. Do you want me to change his appt on Wednesday to a cysto with TOV?

## 2023-09-30 NOTE — Patient Instructions (Signed)
 Medication Instructions:  INCREASE MIDODRINE  TO 10 MG THREE TIMES A DAY *If you need a refill on your cardiac medications before your next appointment, please call your pharmacy*  Lab Work: NO LABS If you have labs (blood work) drawn today and your tests are completely normal, you will receive your results only by: MyChart Message (if you have MyChart) OR A paper copy in the mail If you have any lab test that is abnormal or we need to change your treatment, we will call you to review the results.  Testing/Procedures:[address] - END OF AUGUST 2025 Your physician has requested that you have an echocardiogram. Echocardiography is a painless test that uses sound waves to create images of your heart. It provides your doctor with information about the size and shape of your heart and how well your heart's chambers and valves are working. This procedure takes approximately one hour. There are no restrictions for this procedure. Please do NOT wear cologne, perfume, aftershave, or lotions (deodorant is allowed). Please arrive 15 minutes prior to your appointment time.  Please note: We ask at that you not bring children with you during ultrasound (echo/ vascular) testing. Due to room size and safety concerns, children are not allowed in the ultrasound rooms during exams. Our front office staff cannot provide observation of children in our lobby area while testing is being conducted. An adult accompanying a patient to their appointment will only be allowed in the ultrasound room at the discretion of the ultrasound technician under special circumstances. We apologize for any inconvenience.   Follow-Up: At [facility], you and your health needs are our priority.  As part of our continuing mission to provide you with exceptional heart care, our providers are all part of one team.  This team includes your primary Cardiologist (physician) and Advanced Practice Providers or APPs (Physician  Assistants and Nurse Practitioners) who all work together to provide you with the care you need, when you need it.  Your next appointment:   3 month(s)  Provider:   Knox Perl, MD

## 2023-10-02 ENCOUNTER — Ambulatory Visit: Payer: Self-pay | Admitting: Cardiology

## 2023-10-03 ENCOUNTER — Other Ambulatory Visit (HOSPITAL_BASED_OUTPATIENT_CLINIC_OR_DEPARTMENT_OTHER): Payer: Self-pay

## 2023-10-03 ENCOUNTER — Encounter: Payer: Self-pay | Admitting: Family Medicine

## 2023-10-03 ENCOUNTER — Ambulatory Visit (INDEPENDENT_AMBULATORY_CARE_PROVIDER_SITE_OTHER): Admitting: Family Medicine

## 2023-10-03 VITALS — BP 120/72 | HR 93 | Temp 97.5°F | Resp 16 | Ht 73.0 in | Wt 197.0 lb

## 2023-10-03 DIAGNOSIS — F4321 Adjustment disorder with depressed mood: Secondary | ICD-10-CM | POA: Diagnosis not present

## 2023-10-03 MED ORDER — ESCITALOPRAM OXALATE 10 MG PO TABS
10.0000 mg | ORAL_TABLET | Freq: Every day | ORAL | 1 refills | Status: DC
  Filled 2023-10-03: qty 90, 90d supply, fill #0

## 2023-10-03 NOTE — Progress Notes (Signed)
 [facility] Quality Team Note  Name: John [name] Date of Birth: July 27, 1945 MRN: [id number] Date: 10/03/2023  [facility] Quality Team has reviewed this patient's chart, please see recommendations below:  [facility] Quality Other; (Chart reviewed for Kidney Evaluation in Patients with Diabetes gap. Patient needs Urine Microalbumin/creatinine ratio test completed in 2025 for gap closure. Patient is scheduled for visit with pcp today, 10/03/2023. Please address as time allows.)

## 2023-10-03 NOTE — Patient Instructions (Signed)
 Keep the diet clean and stay active.  Let us know if you need anything.

## 2023-10-03 NOTE — Progress Notes (Signed)
 Chief Complaint  Patient presents with   Follow-up    Follow Up    Subjective John [name] presents for f/u depression. Here w spouse.   Pt is currently being treated with Lexapro  5 mg/d.  Reports no difference since treatment. His wife notices quite a bit of an improvement.  No thoughts of harming self or others. No self-medication with alcohol, prescription drugs or illicit drugs. Pt is not following with a counselor/psychologist.  Past Medical History:  Diagnosis Date   BPH (benign prostatic hyperplasia)    CTS (carpal tunnel syndrome)    Depression    Diabetes mellitus    Essential hypertension    GERD (gastroesophageal reflux disease)    History of breast cancer    Hypercholesteremia    Neuropathy    Stroke (HCC)    Allergies as of 10/03/2023       Reactions   Ramipril Anaphylaxis   Other Diarrhea   Severe intolerance to Chemotherapy in the past.   Sertraline Other (See Comments)   Extreme headaches   Adhesive [tape] Rash        Medication List        Accurate as of October 03, 2023  3:12 PM. If you have any questions, ask your nurse or doctor.          STOP taking these medications    nystatin  powder Commonly known as: MYCOSTATIN /NYSTOP  Stopped by: Mabel Mt Adal Sereno       TAKE these medications    acetaminophen  650 MG CR tablet Commonly known as: TYLENOL  Take 1 tablet (650 mg total) by mouth every 8 (eight) hours as needed for pain.   atorvastatin  80 MG tablet Commonly known as: LIPITOR  Take 1 tablet (80 mg total) by mouth at bedtime.   clopidogrel  75 MG tablet Commonly known as: PLAVIX  Take 1 tablet (75 mg total) by mouth daily with breakfast. *Need appointment for future refills.*   clotrimazole -betamethasone  cream Commonly known as: LOTRISONE  Apply 1 Application topically daily.   cyanocobalamin  1000 MCG tablet Commonly known as: VITAMIN B12 Take 1 tablet (1,000 mcg total) by mouth daily.   Eliquis  5 MG Tabs  tablet Generic drug: apixaban  Take 1 tablet (5 mg total) by mouth 2 (two) times daily.   escitalopram  10 MG tablet Commonly known as: Lexapro  Take 1 tablet (10 mg total) by mouth daily. What changed:  medication strength how much to take Changed by: Mabel Mt [place]   FeroSul 325 (65 Fe) MG tablet Generic drug: ferrous sulfate  Take 1 tablet (325 mg total) by mouth daily with breakfast.   finasteride  5 MG tablet Commonly known as: PROSCAR  Take 1 tablet (5 mg total) by mouth daily. *Need appointment for future refills.*   hyoscyamine  0.125 MG SL tablet Commonly known as: LEVSIN  SL Place 1 tablet (0.125 mg total) under the tongue every 4 (four) hours as needed for bladder spasms.   loperamide  2 MG capsule Commonly known as: IMODIUM  Take 1 capsule (2 mg total) by mouth as needed for diarrhea or loose stools.   metFORMIN  500 MG tablet Commonly known as: GLUCOPHAGE  Take 1 tablet (500 mg total) by mouth 2 (two) times daily with a meal.   midodrine  10 MG tablet Commonly known as: PROAMATINE  Take 1 tablet (10 mg total) by mouth 3 (three) times daily with meals. *Need appointment for future refills.*   multivitamin tablet Take 1 tablet by mouth 2 (two) times a week.   pantoprazole  40 MG tablet Commonly known as: PROTONIX  Take  1 tablet (40 mg total) by mouth at bedtime.   vitamin C 1000 MG tablet Take 1,000 mg by mouth 3 (three) times a week.        Exam BP 120/72 (BP Location: Left Arm, Patient Position: Sitting)   Pulse 93   Temp (!) 97.5 F (36.4 C) (Oral)   Resp 16   Ht 6' 1 (1.854 m)   Wt 197 lb (89.4 kg)   SpO2 96%   BMI 25.99 kg/m  General:  well developed, well nourished, in no apparent distress Lungs:  No respiratory distress Psych: well oriented with normal range of affect and age-appropriate judgement/insight, alert and oriented x4.  Assessment and Plan  Situational depression - Plan: escitalopram  (LEXAPRO ) 10 MG tablet  Chronic, not  controlled. Increase Lexapro  from 5 mg/d to 10 mg/d. Consider counseling. Some improvement at the lower dosage.  F/u in 6 mo for a CPE or prn. The patient voiced understanding and agreement to the plan.  Mabel Mt [place], DO 10/03/23 3:12 PM

## 2023-10-04 DIAGNOSIS — F325 Major depressive disorder, single episode, in full remission: Secondary | ICD-10-CM | POA: Diagnosis not present

## 2023-10-04 DIAGNOSIS — E1142 Type 2 diabetes mellitus with diabetic polyneuropathy: Secondary | ICD-10-CM | POA: Diagnosis not present

## 2023-10-04 DIAGNOSIS — E43 Unspecified severe protein-calorie malnutrition: Secondary | ICD-10-CM | POA: Diagnosis not present

## 2023-10-04 DIAGNOSIS — H43819 Vitreous degeneration, unspecified eye: Secondary | ICD-10-CM | POA: Diagnosis not present

## 2023-10-04 DIAGNOSIS — Z950 Presence of cardiac pacemaker: Secondary | ICD-10-CM | POA: Diagnosis not present

## 2023-10-04 DIAGNOSIS — G4700 Insomnia, unspecified: Secondary | ICD-10-CM | POA: Diagnosis not present

## 2023-10-04 DIAGNOSIS — I959 Hypotension, unspecified: Secondary | ICD-10-CM | POA: Diagnosis not present

## 2023-10-04 DIAGNOSIS — N400 Enlarged prostate without lower urinary tract symptoms: Secondary | ICD-10-CM | POA: Diagnosis not present

## 2023-10-04 DIAGNOSIS — Z853 Personal history of malignant neoplasm of breast: Secondary | ICD-10-CM | POA: Diagnosis not present

## 2023-10-04 DIAGNOSIS — R296 Repeated falls: Secondary | ICD-10-CM | POA: Diagnosis not present

## 2023-10-04 DIAGNOSIS — Z8616 Personal history of COVID-19: Secondary | ICD-10-CM | POA: Diagnosis not present

## 2023-10-04 DIAGNOSIS — I69354 Hemiplegia and hemiparesis following cerebral infarction affecting left non-dominant side: Secondary | ICD-10-CM | POA: Diagnosis not present

## 2023-10-04 DIAGNOSIS — K219 Gastro-esophageal reflux disease without esophagitis: Secondary | ICD-10-CM | POA: Diagnosis not present

## 2023-10-04 DIAGNOSIS — G5600 Carpal tunnel syndrome, unspecified upper limb: Secondary | ICD-10-CM | POA: Diagnosis not present

## 2023-10-04 DIAGNOSIS — K589 Irritable bowel syndrome without diarrhea: Secondary | ICD-10-CM | POA: Diagnosis not present

## 2023-10-04 DIAGNOSIS — E7800 Pure hypercholesterolemia, unspecified: Secondary | ICD-10-CM | POA: Diagnosis not present

## 2023-10-04 DIAGNOSIS — Z7901 Long term (current) use of anticoagulants: Secondary | ICD-10-CM | POA: Diagnosis not present

## 2023-10-04 DIAGNOSIS — Z6826 Body mass index (BMI) 26.0-26.9, adult: Secondary | ICD-10-CM | POA: Diagnosis not present

## 2023-10-04 DIAGNOSIS — D631 Anemia in chronic kidney disease: Secondary | ICD-10-CM | POA: Diagnosis not present

## 2023-10-04 DIAGNOSIS — E66811 Obesity, class 1: Secondary | ICD-10-CM | POA: Diagnosis not present

## 2023-10-04 DIAGNOSIS — I129 Hypertensive chronic kidney disease with stage 1 through stage 4 chronic kidney disease, or unspecified chronic kidney disease: Secondary | ICD-10-CM | POA: Diagnosis not present

## 2023-10-04 DIAGNOSIS — N189 Chronic kidney disease, unspecified: Secondary | ICD-10-CM | POA: Diagnosis not present

## 2023-10-04 DIAGNOSIS — Z7902 Long term (current) use of antithrombotics/antiplatelets: Secondary | ICD-10-CM | POA: Diagnosis not present

## 2023-10-04 DIAGNOSIS — Z7984 Long term (current) use of oral hypoglycemic drugs: Secondary | ICD-10-CM | POA: Diagnosis not present

## 2023-10-04 DIAGNOSIS — E1122 Type 2 diabetes mellitus with diabetic chronic kidney disease: Secondary | ICD-10-CM | POA: Diagnosis not present

## 2023-10-04 NOTE — Progress Notes (Signed)
 Assessment: BPH with retention.  Currently catheter dependent     Plan: -I will keep patient off the alpha-blocker at this point, continue finasteride   - I strongly recommend that at the present time he try to learn self intermittent catheterization.  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  He will be on antiplatelet therapy for at least another couple of months, so he will have to wait some time for that intervention  -I we will have him come back in in a couple weeks.  We will attempt a voiding trial.  I will also discuss initiation of CIC with him  -I will eventually refer him back to [facility], either Dr. Selma or Dr. Nieves for consideration of intervention for BPH   History of Present Illness:  John [name] is a 78 y.o. male who is seen in consultation from [place], DO for evaluation of urinary retention.  He has had an indwelling Foley catheter since 2 April.  He presented with lower abdominal pain and urinary retention.  He went to the emergency room where a catheter was placed, 1500 cc of urine was liberated.  He has had an indwelling catheter since that time, having had a change at least twice.  He is a patient of [facility], has seen Dr. Steen for quite some time.  Because of lack of ability to make an appointment, he saw [facility] for follow-up of his urinary retention.  He did have urodynamics performed on May 22.  Findings: Maximum bladder capacity 202 mL Bladder instability noted Patient voided 104 mL. Q-Maxx-3 mL/s Detrusor pressure at peak flow-88 cmH2O Maximum detrusor pressure 105 cm of water Patient did have evidence of obstruction  The patient had been on tamsulosin , but because of the stroke and hypotensive episodes, this was stopped.  He is still on finasteride .  Past Medical History:  Past Medical History:  Diagnosis Date   BPH  (benign prostatic hyperplasia)    CTS (carpal tunnel syndrome)    Depression    Diabetes mellitus    Essential hypertension    GERD (gastroesophageal reflux disease)    History of breast cancer    Hypercholesteremia    Neuropathy    Stroke [facility])     Past Surgical History:  Past Surgical History:  Procedure Laterality Date   CATARACT EXTRACTION, BILATERAL  01/18/2017   CORONARY STENT INTERVENTION N/A 08/10/2023   Procedure: CORONARY STENT INTERVENTION;  Surgeon: Darron Deatrice LABOR, MD;  Location: [facility] INVASIVE CV LAB;  Service: Cardiovascular;  Laterality: N/A;   LEFT HEART CATH AND CORONARY ANGIOGRAPHY N/A 08/10/2023   Procedure: LEFT HEART CATH AND CORONARY ANGIOGRAPHY;  Surgeon: Darron Deatrice LABOR, MD;  Location: [facility] INVASIVE CV LAB;  Service: Cardiovascular;  Laterality: N/A;   LITHOTRIPSY     MASTECTOMY Right 05/02/2018   PACEMAKER IMPLANT N/A 05/13/2023   Procedure: PACEMAKER IMPLANT;  Surgeon: Kennyth Chew, MD;  Location[facility] INVASIVE CV LAB;  Service: Cardiovascular;  Laterality: N/A;   TEMPORARY PACEMAKER N/A 05/11/2023   Procedure: TEMPORARY PACEMAKER;  Surgeon: Swaziland, Peter M, MD;  Location: [facility] INVASIVE CV LAB;  Service: Cardiovascular;  Laterality: N/A;   TONSILLECTOMY AND ADENOIDECTOMY      Allergies:  Allergies  Allergen Reactions   Ramipril Anaphylaxis   Other Diarrhea    Severe intolerance to Chemotherapy in the past.   Sertraline Other (See Comments)  Extreme headaches   Adhesive [Tape] Rash    Family History:  Family History  Problem Relation Age of Onset   Lymphoma Mother    Cancer Mother    Diabetes Father    Stroke Father    Ovarian cancer Sister    Cancer Sister    Cancer Paternal Grandmother     Social History:  Social History   Tobacco Use   Smoking status: Never   Smokeless tobacco: Never  Vaping Use   Vaping status: Never Used  Substance Use Topics   Alcohol use: No   Drug use: No    Review of symptoms:  Constitutional:  Negative  for unexplained weight loss, night sweats, fever, chills ENT:  Negative for nose bleeds, sinus pain, painful swallowing CV:  Negative for chest pain, shortness of breath, exercise intolerance, palpitations, loss of consciousness Resp:  Negative for cough, wheezing, shortness of breath GI:  Negative for nausea, vomiting, diarrhea, bloody stools GU:  Positives noted in HPI; otherwise negative for gross hematuria, dysuria, urinary incontinence Neuro:  Negative for seizures, poor balance, limb weakness, slurred speech Psych:  Negative for lack of energy, depression, anxiety Endocrine:  Negative for polydipsia, polyuria, symptoms of hypoglycemia (dizziness, hunger, sweating) Hematologic:  Negative for anemia, purpura, petechia, prolonged or excessive bleeding, use of anticoagulants  Allergic:  Negative for difficulty breathing or choking as a result of exposure to anything; no shellfish allergy; no allergic response (rash/itch) to materials, foods  Physical exam: There were no vitals taken for this visit. GENERAL APPEARANCE:  Well appearing, well developed, well nourished, NAD HEENT: Atraumatic, Normocephalic. NECK: Normal appearance LUNGS: Normal inspiratory and expiratory excursion HEART: Regular Rate ABDOMEN: Mildly obese GU: Phallus buried l, no lesions. Scrotal skin normal. Testicles/epididymal structures normal. Meatus normal. Normal anal sphincter tone, prostate 50 mL, symmetric, non nodular, non tender.  Foley catheter present EXTREMITIES: Moves all extremities well.  Without clubbing, cyanosis, or edema. NEUROLOGIC:  Alert and oriented x 3, normal gait, CN II-XII grossly intact.  MENTAL STATUS:  Appropriate. SKIN:  Warm, dry and intact.    Results:  I have reviewed referring/prior physicians notes  I have reviewed urinalysis  I have reviewed PSA results--PSA 1.86 4 years ago  I have reviewed prior imaging--CT images from earlier this year reviewed.  Estimated prostate volume 60  mL.  I have reviewed urodynamic results from [facility]  Cystoscopy Procedure Note:  Indication: ***  After informed consent and discussion of the procedure and its risks, John [name] was positioned and prepped in the standard fashion.  Cystoscopy was performed with a flexible cystoscope.   Findings: Urethra:*** Prostate:*** Bladder neck:*** Ureteral orifices:*** Bladder:***  The patient tolerated the procedure well.

## 2023-10-05 ENCOUNTER — Encounter: Payer: Self-pay | Admitting: Urology

## 2023-10-05 ENCOUNTER — Ambulatory Visit: Admitting: Urology

## 2023-10-05 DIAGNOSIS — R338 Other retention of urine: Secondary | ICD-10-CM

## 2023-10-05 DIAGNOSIS — N138 Other obstructive and reflux uropathy: Secondary | ICD-10-CM

## 2023-10-05 DIAGNOSIS — N401 Enlarged prostate with lower urinary tract symptoms: Secondary | ICD-10-CM | POA: Diagnosis not present

## 2023-10-05 DIAGNOSIS — Z978 Presence of other specified devices: Secondary | ICD-10-CM

## 2023-10-05 MED ORDER — CIPROFLOXACIN HCL 500 MG PO TABS
500.0000 mg | ORAL_TABLET | Freq: Once | ORAL | Status: AC
  Administered 2023-10-05: 500 mg via ORAL

## 2023-10-10 DIAGNOSIS — I69354 Hemiplegia and hemiparesis following cerebral infarction affecting left non-dominant side: Secondary | ICD-10-CM | POA: Diagnosis not present

## 2023-10-10 DIAGNOSIS — R296 Repeated falls: Secondary | ICD-10-CM | POA: Diagnosis not present

## 2023-10-10 DIAGNOSIS — Z7902 Long term (current) use of antithrombotics/antiplatelets: Secondary | ICD-10-CM | POA: Diagnosis not present

## 2023-10-10 DIAGNOSIS — K219 Gastro-esophageal reflux disease without esophagitis: Secondary | ICD-10-CM | POA: Diagnosis not present

## 2023-10-10 DIAGNOSIS — N400 Enlarged prostate without lower urinary tract symptoms: Secondary | ICD-10-CM | POA: Diagnosis not present

## 2023-10-10 DIAGNOSIS — Z8616 Personal history of COVID-19: Secondary | ICD-10-CM | POA: Diagnosis not present

## 2023-10-10 DIAGNOSIS — F325 Major depressive disorder, single episode, in full remission: Secondary | ICD-10-CM | POA: Diagnosis not present

## 2023-10-10 DIAGNOSIS — N189 Chronic kidney disease, unspecified: Secondary | ICD-10-CM | POA: Diagnosis not present

## 2023-10-10 DIAGNOSIS — E7800 Pure hypercholesterolemia, unspecified: Secondary | ICD-10-CM | POA: Diagnosis not present

## 2023-10-10 DIAGNOSIS — Z7984 Long term (current) use of oral hypoglycemic drugs: Secondary | ICD-10-CM | POA: Diagnosis not present

## 2023-10-10 DIAGNOSIS — Z7901 Long term (current) use of anticoagulants: Secondary | ICD-10-CM | POA: Diagnosis not present

## 2023-10-10 DIAGNOSIS — E1122 Type 2 diabetes mellitus with diabetic chronic kidney disease: Secondary | ICD-10-CM | POA: Diagnosis not present

## 2023-10-10 DIAGNOSIS — G4700 Insomnia, unspecified: Secondary | ICD-10-CM | POA: Diagnosis not present

## 2023-10-10 DIAGNOSIS — I959 Hypotension, unspecified: Secondary | ICD-10-CM | POA: Diagnosis not present

## 2023-10-10 DIAGNOSIS — H43819 Vitreous degeneration, unspecified eye: Secondary | ICD-10-CM | POA: Diagnosis not present

## 2023-10-10 DIAGNOSIS — E43 Unspecified severe protein-calorie malnutrition: Secondary | ICD-10-CM | POA: Diagnosis not present

## 2023-10-10 DIAGNOSIS — Z6826 Body mass index (BMI) 26.0-26.9, adult: Secondary | ICD-10-CM | POA: Diagnosis not present

## 2023-10-10 DIAGNOSIS — Z853 Personal history of malignant neoplasm of breast: Secondary | ICD-10-CM | POA: Diagnosis not present

## 2023-10-10 DIAGNOSIS — E1142 Type 2 diabetes mellitus with diabetic polyneuropathy: Secondary | ICD-10-CM | POA: Diagnosis not present

## 2023-10-10 DIAGNOSIS — D631 Anemia in chronic kidney disease: Secondary | ICD-10-CM | POA: Diagnosis not present

## 2023-10-10 DIAGNOSIS — E66811 Obesity, class 1: Secondary | ICD-10-CM | POA: Diagnosis not present

## 2023-10-10 DIAGNOSIS — G5600 Carpal tunnel syndrome, unspecified upper limb: Secondary | ICD-10-CM | POA: Diagnosis not present

## 2023-10-10 DIAGNOSIS — K589 Irritable bowel syndrome without diarrhea: Secondary | ICD-10-CM | POA: Diagnosis not present

## 2023-10-10 DIAGNOSIS — I129 Hypertensive chronic kidney disease with stage 1 through stage 4 chronic kidney disease, or unspecified chronic kidney disease: Secondary | ICD-10-CM | POA: Diagnosis not present

## 2023-10-10 DIAGNOSIS — Z950 Presence of cardiac pacemaker: Secondary | ICD-10-CM | POA: Diagnosis not present

## 2023-10-11 ENCOUNTER — Other Ambulatory Visit: Payer: Self-pay | Admitting: Family Medicine

## 2023-10-11 ENCOUNTER — Other Ambulatory Visit: Payer: Self-pay

## 2023-10-11 ENCOUNTER — Encounter: Payer: Self-pay | Admitting: Family Medicine

## 2023-10-11 ENCOUNTER — Other Ambulatory Visit (HOSPITAL_BASED_OUTPATIENT_CLINIC_OR_DEPARTMENT_OTHER): Payer: Self-pay

## 2023-10-11 ENCOUNTER — Other Ambulatory Visit (HOSPITAL_COMMUNITY): Payer: Self-pay

## 2023-10-11 DIAGNOSIS — C50921 Malignant neoplasm of unspecified site of right male breast: Secondary | ICD-10-CM | POA: Diagnosis not present

## 2023-10-11 DIAGNOSIS — I2699 Other pulmonary embolism without acute cor pulmonale: Secondary | ICD-10-CM | POA: Diagnosis not present

## 2023-10-11 DIAGNOSIS — D509 Iron deficiency anemia, unspecified: Secondary | ICD-10-CM | POA: Diagnosis not present

## 2023-10-11 DIAGNOSIS — Z7189 Other specified counseling: Secondary | ICD-10-CM | POA: Diagnosis not present

## 2023-10-11 DIAGNOSIS — R5383 Other fatigue: Secondary | ICD-10-CM | POA: Diagnosis not present

## 2023-10-11 MED ORDER — APIXABAN 5 MG PO TABS
5.0000 mg | ORAL_TABLET | Freq: Two times a day (BID) | ORAL | 0 refills | Status: DC
  Filled 2023-10-11: qty 180, 90d supply, fill #0

## 2023-10-11 MED ORDER — CLOPIDOGREL BISULFATE 75 MG PO TABS
75.0000 mg | ORAL_TABLET | Freq: Every day | ORAL | 0 refills | Status: DC
  Filled 2023-10-11 (×2): qty 90, 90d supply, fill #0

## 2023-10-11 MED ORDER — CLOPIDOGREL BISULFATE 75 MG PO TABS
75.0000 mg | ORAL_TABLET | Freq: Every day | ORAL | 0 refills | Status: DC
  Filled 2023-10-11: qty 90, 90d supply, fill #0

## 2023-10-12 ENCOUNTER — Other Ambulatory Visit (HOSPITAL_BASED_OUTPATIENT_CLINIC_OR_DEPARTMENT_OTHER): Payer: Self-pay

## 2023-10-12 ENCOUNTER — Encounter: Payer: Self-pay | Admitting: Cardiology

## 2023-10-12 ENCOUNTER — Telehealth: Payer: Self-pay

## 2023-10-12 ENCOUNTER — Other Ambulatory Visit: Payer: Self-pay | Admitting: Family Medicine

## 2023-10-12 NOTE — Telephone Encounter (Signed)
 Copied from CRM [id number]. Topic: Clinical - Home Health Verbal Orders >> Oct 12, 2023  4:06 PM Thersia BROCKS wrote: [facility] order requesting to be discharged from ot services  [phone]  [facility] back letting her know we dont have   Any orders for him. She stated would fax it over to us .   Advised her Dr.Wendling sign and we fax back when   we receive it.

## 2023-10-18 DIAGNOSIS — D631 Anemia in chronic kidney disease: Secondary | ICD-10-CM | POA: Diagnosis not present

## 2023-10-18 DIAGNOSIS — N189 Chronic kidney disease, unspecified: Secondary | ICD-10-CM | POA: Diagnosis not present

## 2023-10-18 DIAGNOSIS — Z7984 Long term (current) use of oral hypoglycemic drugs: Secondary | ICD-10-CM | POA: Diagnosis not present

## 2023-10-18 DIAGNOSIS — K589 Irritable bowel syndrome without diarrhea: Secondary | ICD-10-CM | POA: Diagnosis not present

## 2023-10-18 DIAGNOSIS — K219 Gastro-esophageal reflux disease without esophagitis: Secondary | ICD-10-CM | POA: Diagnosis not present

## 2023-10-18 DIAGNOSIS — R296 Repeated falls: Secondary | ICD-10-CM | POA: Diagnosis not present

## 2023-10-18 DIAGNOSIS — E7800 Pure hypercholesterolemia, unspecified: Secondary | ICD-10-CM | POA: Diagnosis not present

## 2023-10-18 DIAGNOSIS — F325 Major depressive disorder, single episode, in full remission: Secondary | ICD-10-CM | POA: Diagnosis not present

## 2023-10-18 DIAGNOSIS — I959 Hypotension, unspecified: Secondary | ICD-10-CM | POA: Diagnosis not present

## 2023-10-18 DIAGNOSIS — Z853 Personal history of malignant neoplasm of breast: Secondary | ICD-10-CM | POA: Diagnosis not present

## 2023-10-18 DIAGNOSIS — E66811 Obesity, class 1: Secondary | ICD-10-CM | POA: Diagnosis not present

## 2023-10-18 DIAGNOSIS — I129 Hypertensive chronic kidney disease with stage 1 through stage 4 chronic kidney disease, or unspecified chronic kidney disease: Secondary | ICD-10-CM | POA: Diagnosis not present

## 2023-10-18 DIAGNOSIS — Z6826 Body mass index (BMI) 26.0-26.9, adult: Secondary | ICD-10-CM | POA: Diagnosis not present

## 2023-10-18 DIAGNOSIS — E1122 Type 2 diabetes mellitus with diabetic chronic kidney disease: Secondary | ICD-10-CM | POA: Diagnosis not present

## 2023-10-18 DIAGNOSIS — N400 Enlarged prostate without lower urinary tract symptoms: Secondary | ICD-10-CM | POA: Diagnosis not present

## 2023-10-18 DIAGNOSIS — E1142 Type 2 diabetes mellitus with diabetic polyneuropathy: Secondary | ICD-10-CM | POA: Diagnosis not present

## 2023-10-18 DIAGNOSIS — Z950 Presence of cardiac pacemaker: Secondary | ICD-10-CM | POA: Diagnosis not present

## 2023-10-18 DIAGNOSIS — E43 Unspecified severe protein-calorie malnutrition: Secondary | ICD-10-CM | POA: Diagnosis not present

## 2023-10-18 DIAGNOSIS — G5600 Carpal tunnel syndrome, unspecified upper limb: Secondary | ICD-10-CM | POA: Diagnosis not present

## 2023-10-18 DIAGNOSIS — Z7901 Long term (current) use of anticoagulants: Secondary | ICD-10-CM | POA: Diagnosis not present

## 2023-10-18 DIAGNOSIS — Z7902 Long term (current) use of antithrombotics/antiplatelets: Secondary | ICD-10-CM | POA: Diagnosis not present

## 2023-10-18 DIAGNOSIS — Z8616 Personal history of COVID-19: Secondary | ICD-10-CM | POA: Diagnosis not present

## 2023-10-18 DIAGNOSIS — H43819 Vitreous degeneration, unspecified eye: Secondary | ICD-10-CM | POA: Diagnosis not present

## 2023-10-18 DIAGNOSIS — G4700 Insomnia, unspecified: Secondary | ICD-10-CM | POA: Diagnosis not present

## 2023-10-18 DIAGNOSIS — I69354 Hemiplegia and hemiparesis following cerebral infarction affecting left non-dominant side: Secondary | ICD-10-CM | POA: Diagnosis not present

## 2023-10-21 DIAGNOSIS — D509 Iron deficiency anemia, unspecified: Secondary | ICD-10-CM | POA: Diagnosis not present

## 2023-10-25 DIAGNOSIS — H43819 Vitreous degeneration, unspecified eye: Secondary | ICD-10-CM | POA: Diagnosis not present

## 2023-10-25 DIAGNOSIS — Z853 Personal history of malignant neoplasm of breast: Secondary | ICD-10-CM | POA: Diagnosis not present

## 2023-10-25 DIAGNOSIS — Z7984 Long term (current) use of oral hypoglycemic drugs: Secondary | ICD-10-CM | POA: Diagnosis not present

## 2023-10-25 DIAGNOSIS — E1142 Type 2 diabetes mellitus with diabetic polyneuropathy: Secondary | ICD-10-CM | POA: Diagnosis not present

## 2023-10-25 DIAGNOSIS — E1122 Type 2 diabetes mellitus with diabetic chronic kidney disease: Secondary | ICD-10-CM | POA: Diagnosis not present

## 2023-10-25 DIAGNOSIS — Z7901 Long term (current) use of anticoagulants: Secondary | ICD-10-CM | POA: Diagnosis not present

## 2023-10-25 DIAGNOSIS — E7800 Pure hypercholesterolemia, unspecified: Secondary | ICD-10-CM | POA: Diagnosis not present

## 2023-10-25 DIAGNOSIS — G4700 Insomnia, unspecified: Secondary | ICD-10-CM | POA: Diagnosis not present

## 2023-10-25 DIAGNOSIS — I129 Hypertensive chronic kidney disease with stage 1 through stage 4 chronic kidney disease, or unspecified chronic kidney disease: Secondary | ICD-10-CM | POA: Diagnosis not present

## 2023-10-25 DIAGNOSIS — Z6826 Body mass index (BMI) 26.0-26.9, adult: Secondary | ICD-10-CM | POA: Diagnosis not present

## 2023-10-25 DIAGNOSIS — I959 Hypotension, unspecified: Secondary | ICD-10-CM | POA: Diagnosis not present

## 2023-10-25 DIAGNOSIS — R296 Repeated falls: Secondary | ICD-10-CM | POA: Diagnosis not present

## 2023-10-25 DIAGNOSIS — Z7902 Long term (current) use of antithrombotics/antiplatelets: Secondary | ICD-10-CM | POA: Diagnosis not present

## 2023-10-25 DIAGNOSIS — Z8616 Personal history of COVID-19: Secondary | ICD-10-CM | POA: Diagnosis not present

## 2023-10-25 DIAGNOSIS — K219 Gastro-esophageal reflux disease without esophagitis: Secondary | ICD-10-CM | POA: Diagnosis not present

## 2023-10-25 DIAGNOSIS — I69354 Hemiplegia and hemiparesis following cerebral infarction affecting left non-dominant side: Secondary | ICD-10-CM | POA: Diagnosis not present

## 2023-10-25 DIAGNOSIS — D631 Anemia in chronic kidney disease: Secondary | ICD-10-CM | POA: Diagnosis not present

## 2023-10-25 DIAGNOSIS — E43 Unspecified severe protein-calorie malnutrition: Secondary | ICD-10-CM | POA: Diagnosis not present

## 2023-10-25 DIAGNOSIS — K589 Irritable bowel syndrome without diarrhea: Secondary | ICD-10-CM | POA: Diagnosis not present

## 2023-10-25 DIAGNOSIS — F325 Major depressive disorder, single episode, in full remission: Secondary | ICD-10-CM | POA: Diagnosis not present

## 2023-10-25 DIAGNOSIS — N400 Enlarged prostate without lower urinary tract symptoms: Secondary | ICD-10-CM | POA: Diagnosis not present

## 2023-10-25 DIAGNOSIS — G5600 Carpal tunnel syndrome, unspecified upper limb: Secondary | ICD-10-CM | POA: Diagnosis not present

## 2023-10-25 DIAGNOSIS — Z950 Presence of cardiac pacemaker: Secondary | ICD-10-CM | POA: Diagnosis not present

## 2023-10-25 DIAGNOSIS — N189 Chronic kidney disease, unspecified: Secondary | ICD-10-CM | POA: Diagnosis not present

## 2023-10-25 DIAGNOSIS — E66811 Obesity, class 1: Secondary | ICD-10-CM | POA: Diagnosis not present

## 2023-10-26 ENCOUNTER — Telehealth: Payer: Self-pay | Admitting: Urology

## 2023-10-26 ENCOUNTER — Emergency Department (HOSPITAL_BASED_OUTPATIENT_CLINIC_OR_DEPARTMENT_OTHER)

## 2023-10-26 ENCOUNTER — Telehealth: Payer: Self-pay | Admitting: Family Medicine

## 2023-10-26 ENCOUNTER — Other Ambulatory Visit (HOSPITAL_BASED_OUTPATIENT_CLINIC_OR_DEPARTMENT_OTHER): Payer: Self-pay

## 2023-10-26 ENCOUNTER — Other Ambulatory Visit: Payer: Self-pay

## 2023-10-26 ENCOUNTER — Emergency Department (HOSPITAL_BASED_OUTPATIENT_CLINIC_OR_DEPARTMENT_OTHER): Admission: EM | Admit: 2023-10-26 | Discharge: 2023-10-26 | Disposition: A | Discharge status: Home / Self Care

## 2023-10-26 ENCOUNTER — Encounter (HOSPITAL_BASED_OUTPATIENT_CLINIC_OR_DEPARTMENT_OTHER): Payer: Self-pay

## 2023-10-26 ENCOUNTER — Ambulatory Visit: Payer: Self-pay | Admitting: *Deleted

## 2023-10-26 DIAGNOSIS — R5383 Other fatigue: Secondary | ICD-10-CM

## 2023-10-26 DIAGNOSIS — I10 Essential (primary) hypertension: Secondary | ICD-10-CM | POA: Diagnosis not present

## 2023-10-26 DIAGNOSIS — R739 Hyperglycemia, unspecified: Secondary | ICD-10-CM | POA: Diagnosis not present

## 2023-10-26 DIAGNOSIS — Z7901 Long term (current) use of anticoagulants: Secondary | ICD-10-CM | POA: Diagnosis not present

## 2023-10-26 DIAGNOSIS — R410 Disorientation, unspecified: Secondary | ICD-10-CM | POA: Diagnosis not present

## 2023-10-26 DIAGNOSIS — N390 Urinary tract infection, site not specified: Secondary | ICD-10-CM | POA: Insufficient documentation

## 2023-10-26 DIAGNOSIS — E1165 Type 2 diabetes mellitus with hyperglycemia: Secondary | ICD-10-CM | POA: Diagnosis not present

## 2023-10-26 LAB — URINALYSIS, ROUTINE W REFLEX MICROSCOPIC
Bilirubin Urine: NEGATIVE
Glucose, UA: >=500 mg/dL — AB
Ketones, ur: NEGATIVE mg/dL
Nitrite: NEGATIVE
Protein, ur: NEGATIVE mg/dL
Specific Gravity, Urine: 1.015 (ref 1.005–1.030)
pH: 5.5 (ref 5.0–8.0)

## 2023-10-26 LAB — CBC WITH DIFFERENTIAL/PLATELET
Abs Immature Granulocytes: 0.06 K/uL (ref 0.00–0.07)
Basophils Absolute: 0.1 K/uL (ref 0.0–0.1)
Basophils Relative: 1 %
Eosinophils Absolute: 0.2 K/uL (ref 0.0–0.5)
Eosinophils Relative: 3 %
HCT: 31.1 % — ABNORMAL LOW (ref 39.0–52.0)
Hemoglobin: 9.9 g/dL — ABNORMAL LOW (ref 13.0–17.0)
Immature Granulocytes: 1 %
Lymphocytes Relative: 19 %
Lymphs Abs: 1.4 K/uL (ref 0.7–4.0)
MCH: 26.8 pg (ref 26.0–34.0)
MCHC: 31.8 g/dL (ref 30.0–36.0)
MCV: 84.1 fL (ref 80.0–100.0)
Monocytes Absolute: 0.5 K/uL (ref 0.1–1.0)
Monocytes Relative: 7 %
Neutro Abs: 5.1 K/uL (ref 1.7–7.7)
Neutrophils Relative %: 69 %
Platelets: 244 K/uL (ref 150–400)
RBC: 3.7 MIL/uL — ABNORMAL LOW (ref 4.22–5.81)
RDW: 17.6 % — ABNORMAL HIGH (ref 11.5–15.5)
WBC: 7.3 K/uL (ref 4.0–10.5)
nRBC: 0 % (ref 0.0–0.2)

## 2023-10-26 LAB — LIPASE, BLOOD: Lipase: 39 U/L (ref 11–51)

## 2023-10-26 LAB — BASIC METABOLIC PANEL WITH GFR
Anion gap: 13 (ref 5–15)
BUN: 10 mg/dL (ref 8–23)
CO2: 23 mmol/L (ref 22–32)
Calcium: 9.4 mg/dL (ref 8.9–10.3)
Chloride: 99 mmol/L (ref 98–111)
Creatinine, Ser: 0.88 mg/dL (ref 0.61–1.24)
GFR, Estimated: >60 mL/min (ref >60)
Glucose, Bld: 436 mg/dL — ABNORMAL HIGH (ref 70–99)
Potassium: 3.8 mmol/L (ref 3.5–5.1)
Sodium: 135 mmol/L (ref 135–145)

## 2023-10-26 LAB — HEPATIC FUNCTION PANEL
ALT: 12 U/L (ref 0–44)
AST: 16 U/L (ref 15–41)
Albumin: 3.7 g/dL (ref 3.5–5.0)
Alkaline Phosphatase: 56 U/L (ref 38–126)
Bilirubin, Direct: 0.4 mg/dL — ABNORMAL HIGH (ref 0.0–0.2)
Indirect Bilirubin: 0.5 mg/dL (ref 0.3–0.9)
Total Bilirubin: 0.9 mg/dL (ref 0.0–1.2)
Total Protein: 5.9 g/dL — ABNORMAL LOW (ref 6.5–8.1)

## 2023-10-26 LAB — URINALYSIS, MICROSCOPIC (REFLEX): Squamous Epithelial / HPF: NONE SEEN /HPF (ref 0–5)

## 2023-10-26 LAB — CBG MONITORING, ED: Glucose-Capillary: 357 mg/dL — ABNORMAL HIGH (ref 70–99)

## 2023-10-26 LAB — TROPONIN T, HIGH SENSITIVITY
Troponin T High Sensitivity: 67 ng/L — ABNORMAL HIGH (ref <19)
Troponin T High Sensitivity: 67 ng/L — ABNORMAL HIGH (ref <19)

## 2023-10-26 LAB — AMMONIA: Ammonia: 24 umol/L (ref 9–35)

## 2023-10-26 MED ORDER — INSULIN ASPART 100 UNIT/ML IJ SOLN
10.0000 [IU] | Freq: Once | INTRAMUSCULAR | Status: AC
  Administered 2023-10-26: 10 [IU] via INTRAVENOUS

## 2023-10-26 MED ORDER — CEPHALEXIN 500 MG PO CAPS
500.0000 mg | ORAL_CAPSULE | Freq: Four times a day (QID) | ORAL | 0 refills | Status: AC
  Filled 2023-10-26: qty 28, 7d supply, fill #0

## 2023-10-26 MED ORDER — CEPHALEXIN 250 MG PO CAPS
500.0000 mg | ORAL_CAPSULE | Freq: Once | ORAL | Status: AC
  Administered 2023-10-26: 500 mg via ORAL
  Filled 2023-10-26: qty 2

## 2023-10-26 MED ORDER — LACTATED RINGERS IV BOLUS
1000.0000 mL | Freq: Once | INTRAVENOUS | Status: AC
  Administered 2023-10-26: 1000 mL via INTRAVENOUS

## 2023-10-26 MED ORDER — INSULIN GLARGINE 100 UNIT/ML ~~LOC~~ SOLN
10.0000 [IU] | Freq: Every day | SUBCUTANEOUS | 11 refills | Status: DC
  Filled 2023-10-26: qty 10, 100d supply, fill #0

## 2023-10-26 NOTE — Discharge Instructions (Addendum)
 Take your antibiotics as prescribed for your UTI. Follow up with urology to discuss removal of your catheter with them. Call the office to make an appointment.   Check your blood glucose daily. Continue your metformin  as prescribed and start your lantus  10 units daily. Call your primary care doctor today or tomorrow and make a follow up appointment for next week to discuss your diabetic regimen further.

## 2023-10-26 NOTE — Telephone Encounter (Signed)
 Patients wife called wanting to notify doctor that patient is in the ER. Routed to [place]. Patient also called to see if they have been scheduled at [facility]. I gave them the appointment date but could not see the time. Gave the [facility] phone number.

## 2023-10-26 NOTE — ED Triage Notes (Signed)
 Cancer pt at [facility].  Pt had Iron infusion on Friday, pt wife reports increased fatigue and confusion since infusion.   A&Ox4, denies headache, dizziness, pain, NV or fevers

## 2023-10-26 NOTE — Telephone Encounter (Signed)
 FYI- instructed to go to ED. Family member unsure if she will take him.

## 2023-10-26 NOTE — Telephone Encounter (Signed)
 FYI NT called to report she has sent this pt to ed. Daughter is calling EMS. She wanted to be sure DR Frann was alerted.

## 2023-10-26 NOTE — Telephone Encounter (Addendum)
 Copied from CRM [id number]. Topic: Clinical - Red Word Triage >> Oct 26, 2023 10:55 AM Robinson H wrote: [facility] that prompted transfer to Nurse Triage: Patients wife states patient is having extreme fatigue Reason for Disposition  [1] MODERATE weakness (e.g., interferes with work, school, normal activities) AND [2] cause unknown  (Exceptions: Weakness from acute minor illness or poor fluid intake; weakness is chronic and not worse.)  Answer Assessment - Initial Assessment Questions 1. DESCRIPTION: Describe how you are feeling.     John [name], wife calling in. Last Friday he had an iron IV that cancer doctor ordered for low iron.  He does not have cancer.   He had the infusion.  He came home, ate and sept.   Finally Tues he got up from sleeping a lot.   Today he was up at 10:00 and now he is asleep again.   I don't know what is going on with him.  He is sleeping all the time.   He has had a stroke, pacemaker, heart attack and cancer 5 years ago.   I need him to see a doctor.   No one sleeps like this all time.   2. SEVERITY: How bad is it?  Can you stand and walk?     He is sleeping all the time. 3. ONSET: When did these symptoms begin? (e.g., hours, days, weeks, months)     Since last Friday.   He slept a lot before then but it's gotten much worse. 4. CAUSE: What do you think is causing the weakness or fatigue? (e.g., not drinking enough fluids, medical problem, trouble sleeping)     I don't know. 5. NEW MEDICINES:  Have you started on any new medicines recently? (e.g., opioid pain medicines, benzodiazepines, muscle relaxants, antidepressants, antihistamines, neuroleptics, beta blockers)     No new medicine.    Just the iron. He is for another iron infusion on Friday.  It's been cancelled.   This sleeping all the time since then.   His mind being messed up too.    6. OTHER SYMPTOMS: Do you have any other symptoms? (e.g., chest pain, fever, cough, SOB, vomiting, diarrhea, bleeding, other  areas of pain)     He is acting confused at time too. 7. PREGNANCY: Is there any chance you are pregnant? When was your last menstrual period?     N/A  Protocols used: Weakness (Generalized) and Fatigue-A-AH FYI Only or Action Required?: Action required by provider: update on patient condition.  Patient was last seen in primary care on 10/03/2023 by [name], DO.  Called Nurse Triage reporting Fatigue.  Symptoms began several days ago. Daughter, Boby called in.   Pt had an iron infusion last Friday.  Ever since then he has been sleeping all the time.  He is also having intermittent confusion.  This is not like him at all.   He does sleep a lot but nothing like this.   He isn't usually confused.     Interventions attempted: Rest, hydration, or home remedies.  Symptoms are: rapidly worsening.Sleeping all the time, intermittently confused  Triage Disposition: Go to ED Now (Notify PCP)  Patient/caregiver understands and will follow disposition?: Unsure I instructed her to call 911 and have him taken to the ED.   John [name] wasn't sure she could get him in the car due to him being so weak and sleeping all the time.  She didn't want to call 911 due to expense and so many ambulance trips  to the hospital already.  I'll have to see if I can talk him into going.   I'll see what I can do.    I again highly encouraged her to call 911 and not try to get him out to the car herself.  She doesn't have anyone that can help her.  I'll see what I can do.    High priority message sent to [facility].  I also called into the office and spoke with Jamee.  She will get my notes to the clinical team.

## 2023-10-26 NOTE — ED Provider Notes (Signed)
 [place] EMERGENCY DEPARTMENT AT [facility] Provider Note   CSN: [id number] Arrival date & time: 10/26/23  1146     Patient presents with: Fatigue   John [name] is a 78 y.o. male.   77 year old male presents for evaluation of fatigue.  History is primarily provided by the wife as patient is tired.  He states he has had not had any symptoms and otherwise feeling well just wants to go back to sleep.  Wife states he has been sleeping more often since Friday when he received an iron infusion.  Was following up with his cancer doctor for routine labs and screening as his cancer is in remission and was found to have low iron.  Wife states patient was sleepy prior to the iron infusion but this seems to be worse.  Patient denies any other symptoms or concerns at this time.        Prior to Admission medications   Medication Sig Start Date End Date Taking? Authorizing Provider  cephALEXin  (KEFLEX ) 500 MG capsule Take 1 capsule (500 mg total) by mouth 4 (four) times daily for 7 days. 10/26/23 11/02/23 Yes Chakita Mcgraw L, DO  insulin  glargine (LANTUS ) 100 UNIT/ML injection Inject 0.1 mLs (10 Units total) into the skin daily for 7 days. 10/26/23 02/03/24 Yes Avis Mcmahill L, DO  acetaminophen  (TYLENOL ) 650 MG CR tablet Take 1 tablet (650 mg total) by mouth every 8 (eight) hours as needed for pain. 05/14/23   Zhao, Xika, NP  Ascorbic Acid  (VITAMIN C) 1000 MG tablet Take 1,000 mg by mouth 3 (three) times a week.    [provider]  atorvastatin  (LIPITOR ) 80 MG tablet Take 1 tablet (80 mg total) by mouth at bedtime. 09/14/23   [name], DO  clopidogrel  (PLAVIX ) 75 MG tablet Take 1 tablet (75 mg total) by mouth daily with breakfast. 10/11/23   [name], DO  clotrimazole -betamethasone  (LOTRISONE ) cream Apply 1 Application topically daily. 07/11/23   [name], DO  cyanocobalamin  1000 MCG tablet Take 1 tablet (1,000 mcg total) by mouth  daily. 09/14/23   [name], DO  escitalopram  (LEXAPRO ) 10 MG tablet Take 1 tablet (10 mg total) by mouth daily. 10/03/23   [name], DO  ferrous sulfate  325 (65 FE) MG tablet Take 1 tablet (325 mg total) by mouth daily with breakfast. 09/14/23   Frann, Mabel Mt, DO  finasteride  (PROSCAR ) 5 MG tablet Take 1 tablet (5 mg total) by mouth daily. *Need appointment for future refills.* 09/14/23   [name], DO  hyoscyamine  (LEVSIN  SL) 0.125 MG SL tablet Place 1 tablet (0.125 mg total) under the tongue every 4 (four) hours as needed for bladder spasms. 08/17/23   Angiulli, Toribio PARAS, PA-C  loperamide  (IMODIUM ) 2 MG capsule Take 1 capsule (2 mg total) by mouth as needed for diarrhea or loose stools. 08/18/23   Love, Sharlet RAMAN, PA-C  metFORMIN  (GLUCOPHAGE ) 500 MG tablet Take 1 tablet (500 mg total) by mouth 2 (two) times daily with a meal. 09/14/23   Wendling, Mabel Mt, DO  midodrine  (PROAMATINE ) 10 MG tablet Take 1 tablet (10 mg total) by mouth 3 (three) times daily with meals. *Need appointment for future refills.* 09/30/23   Meng, Hao, PA  Multiple Vitamin (MULTIVITAMIN) tablet Take 1 tablet by mouth 2 (two) times a week.    [provider]  pantoprazole  (PROTONIX ) 40 MG tablet Take 1 tablet (40 mg total) by mouth at bedtime. 08/17/23  Angiulli, Toribio PARAS, PA-C  amLODipine  (NORVASC ) 2.5 MG tablet Take 1 tablet (2.5 mg total) by mouth daily. 06/15/23 06/30/23  [name], DO  apixaban  (ELIQUIS ) 5 MG TABS tablet Take 1 tablet (5 mg total) by mouth 2 (two) times daily. 10/11/23 10/11/23  [name], DO    Allergies: Ramipril, Other, Sertraline, and Adhesive [tape]    Review of Systems  Constitutional:  Positive for fatigue. Negative for chills and fever.  HENT:  Negative for ear pain and sore throat.   Eyes:  Negative for pain and visual disturbance.  Respiratory:  Negative for cough and shortness of breath.   Cardiovascular:  Negative  for chest pain and palpitations.  Gastrointestinal:  Negative for abdominal pain and vomiting.  Genitourinary:  Negative for dysuria and hematuria.  Musculoskeletal:  Negative for arthralgias and back pain.  Skin:  Negative for color change and rash.  Neurological:  Negative for seizures and syncope.  All other systems reviewed and are negative.   Updated Vital Signs BP (!) 170/73   Pulse 75   Temp 98.5 F (36.9 C) (Oral)   Resp 20   Ht 6' (1.829 m)   Wt 89.4 kg   SpO2 94%   BMI 26.72 kg/m   Physical Exam Vitals and nursing note reviewed.  Constitutional:      General: He is not in acute distress.    Appearance: Normal appearance. He is well-developed. He is not ill-appearing.  HENT:     Head: Normocephalic and atraumatic.  Eyes:     Conjunctiva/sclera: Conjunctivae normal.  Cardiovascular:     Rate and Rhythm: Normal rate and regular rhythm.     Heart sounds: Normal heart sounds. No murmur heard. Pulmonary:     Effort: Pulmonary effort is normal. No respiratory distress.     Breath sounds: Normal breath sounds. No stridor. No wheezing or rhonchi.  Abdominal:     Palpations: Abdomen is soft.     Tenderness: There is no abdominal tenderness.  Musculoskeletal:        General: No swelling.     Cervical back: Neck supple.  Skin:    General: Skin is warm and dry.     Capillary Refill: Capillary refill takes less than 2 seconds.  Neurological:     Mental Status: He is alert.  Psychiatric:        Mood and Affect: Mood normal.     (all labs ordered are listed, but only abnormal results are displayed) Labs Reviewed  HEPATIC FUNCTION PANEL - Abnormal; Notable for the following components:      Result Value   Total Protein 5.9 (*)    Bilirubin, Direct 0.4 (*)    All other components within normal limits  BASIC METABOLIC PANEL WITH GFR - Abnormal; Notable for the following components:   Glucose, Bld 436 (*)    All other components within normal limits  CBC WITH  DIFFERENTIAL/PLATELET - Abnormal; Notable for the following components:   RBC 3.70 (*)    Hemoglobin 9.9 (*)    HCT 31.1 (*)    RDW 17.6 (*)    All other components within normal limits  URINALYSIS, ROUTINE W REFLEX MICROSCOPIC - Abnormal; Notable for the following components:   APPearance TURBID (*)    Glucose, UA >=500 (*)    Hgb urine dipstick TRACE (*)    Leukocytes,Ua MODERATE (*)    All other components within normal limits  URINALYSIS, MICROSCOPIC (REFLEX) - Abnormal; Notable for the following  components:   Bacteria, UA RARE (*)    All other components within normal limits  CBG MONITORING, ED - Abnormal; Notable for the following components:   Glucose-Capillary 357 (*)    All other components within normal limits  TROPONIN T, HIGH SENSITIVITY - Abnormal; Notable for the following components:   Troponin T High Sensitivity 67 (*)    All other components within normal limits  TROPONIN T, HIGH SENSITIVITY - Abnormal; Notable for the following components:   Troponin T High Sensitivity 67 (*)    All other components within normal limits  LIPASE, BLOOD  AMMONIA    EKG: EKG Interpretation Date/Time:  Wednesday October 26 2023 11:59:37 EDT Ventricular Rate:  84 PR Interval:  134 QRS Duration:  139 QT Interval:  420 QTC Calculation: 497 R Axis:   -68  Text Interpretation: Sinus rhythm Right bundle branch block Abnormal lateral Q waves Compared with previous EKG on 08/30/2023 Confirmed by Gennaro Bouchard ([id number]) on 10/26/2023 12:01:33 PM  Radiology: [facility] Chest 1 View Result Date: 10/26/2023 CLINICAL DATA:  Increased fatigue and confusion EXAM: CHEST  1 VIEW COMPARISON:  Chest x-ray performed August 06, 2023 FINDINGS: The left-sided implant is present with leads terminating in the heart. Heart mediastinum are not significantly changed. No focal infiltrate, pleural effusion, or pneumothorax. IMPRESSION: 1. No focal infiltrate, pleural effusion, or pneumothorax. Electronically Signed   By:  Maude Naegeli M.D.   On: 10/26/2023 12:57   CT Head Wo Contrast Result Date: 10/26/2023 CLINICAL DATA:  78 year old male with increased fatigue and confusion. Status post iron infusion four days ago. EXAM: CT HEAD WITHOUT CONTRAST TECHNIQUE: Contiguous axial images were obtained from the base of the skull through the vertex without intravenous contrast. RADIATION DOSE REDUCTION: This exam was performed according to the departmental dose-optimization program which includes automated exposure control, adjustment of the mA and/or kV according to patient size and/or use of iterative reconstruction technique. COMPARISON:  Brain MRI 05/08/2023.  Head CT 08/30/2023. FINDINGS: Brain: Stable cerebral volume. No midline shift, ventriculomegaly, mass effect, evidence of mass lesion, intracranial hemorrhage or evidence of cortically based acute infarction. 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 Stable Patchy and confluent bilateral cerebral white matter hypodensity. Vascular: No suspicious intracranial vascular hyperdensity. Calcified atherosclerosis at the skull base. Skull: Intact.  No acute osseous abnormality identified. Sinuses/Orbits: Visualized paranasal sinuses and mastoids are stable and well aerated. Other: No acute orbit or scalp soft tissue finding. IMPRESSION: No acute intracranial abnormality. 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 Electronically Signed   By: VEAR Hurst M.D.   On: 10/26/2023 12:53     Procedures   Medications Ordered in the ED  lactated ringers  bolus 1,000 mL (0 mLs Intravenous Stopped 10/26/23 1354)  insulin  aspart (novoLOG ) injection 10 Units (10 Units Intravenous Given 10/26/23 1322)  cephALEXin  (KEFLEX ) capsule 500 mg (500 mg Oral Given 10/26/23 1322)                                    Medical Decision Making Medical Decision Making Nursing notes are reviewed. Differential  diagnosis for this patient would include but not limited to: UTI, Electrolyte abnormality, acute blood loss anemia, DKA, pneumonia, other  Emergency Department Course:  Vital signs and pulse oximetry are reviewed, evaluated by myself and found to be within normal limits prior to final  disposition. Findings of laboratory testing and medical imaging are discussed with patient and family that is available. Patient agrees with the medical care plan as follows:  Patient here for fatigue, he has stable vital signs and otherwise appears well.  Workup reviewed as documented below and patient does have evidence of UTI as well as hyperglycemia.  Was treated with IV fluids and insulin  here and he did have improvement in his blood glucose.  Patient has slight elevated addition his troponin but he does have a troponin leak at baseline and no significant acute changes on EKG and he is not complaining of chest pain or any symptoms concerning for cardiac etiology at this time.  I will start him on Keflex  for his urinary tract infection I did discuss with him to follow-up with urology regarding the patient's Foley catheter.  Will also start the patient on Lantus  and he is advised to continue to use his metformin  as prescribed and check his blood glucose daily.  Advised obtain close follow-up with primary care to further discuss his diabetes and medication regimen.  Advised return for any new or worsening symptoms.  Patient and wife feel comfortable with the plan to be discharged home.  Problems Addressed: Hyperglycemia: acute illness or injury Other fatigue: undiagnosed new problem with uncertain prognosis Urinary tract infection without hematuria, site unspecified: acute illness or injury  Amount and/or Complexity of Data Reviewed Independent Historian: spouse    Details: Most of history provided by spouse who told me that patient has been more fatigued lately, had iron infusion on Friday and fatigue seem to get  worse after that External Data Reviewed: notes.    Details: Outpatient records reviewed and patient was seen by oncology for routine workup, his cancer has been in remission for about 5 years.  He was found to have low iron and iron infusion was scheduled for last Friday Labs: ordered. Decision-making details documented in ED Course.    Details: Ordered and reviewed by me and patient with slightly elevated troponin, however he has a troponin leak at baseline.  Also with evidence of UTI some anemia and hyperglycemia without any anion gap or evidence of acidosis Radiology: ordered and independent interpretation performed. Decision-making details documented in ED Course.    Details: Ordered and interpreted by me independently of cardiology Chest x-ray: Shows no acute abnormality CT head: Shows no acute intracranial process ECG/medicine tests: ordered and independent interpretation performed. Decision-making details documented in ED Course.    Details: Ordered and interpreted by me in the absence of cardiology and shows sinus rhythm with a right bundle branch block, no significant change when compared to prior EKG  Risk OTC drugs. Prescription drug management. Drug therapy requiring intensive monitoring for toxicity.     Final diagnoses:  Urinary tract infection without hematuria, site unspecified  Hyperglycemia  Other fatigue    ED Discharge Orders          Ordered    insulin  glargine (LANTUS ) 100 UNIT/ML injection  Daily        10/26/23 1446    cephALEXin  (KEFLEX ) 500 MG capsule  4 times daily        10/26/23 1446               Ashur Glatfelter L, DO 10/26/23 1503

## 2023-10-29 DIAGNOSIS — E7800 Pure hypercholesterolemia, unspecified: Secondary | ICD-10-CM | POA: Diagnosis not present

## 2023-10-29 DIAGNOSIS — F325 Major depressive disorder, single episode, in full remission: Secondary | ICD-10-CM | POA: Diagnosis not present

## 2023-10-29 DIAGNOSIS — E1122 Type 2 diabetes mellitus with diabetic chronic kidney disease: Secondary | ICD-10-CM | POA: Diagnosis not present

## 2023-10-29 DIAGNOSIS — D631 Anemia in chronic kidney disease: Secondary | ICD-10-CM | POA: Diagnosis not present

## 2023-10-29 DIAGNOSIS — E1142 Type 2 diabetes mellitus with diabetic polyneuropathy: Secondary | ICD-10-CM | POA: Diagnosis not present

## 2023-10-29 DIAGNOSIS — G5600 Carpal tunnel syndrome, unspecified upper limb: Secondary | ICD-10-CM | POA: Diagnosis not present

## 2023-10-29 DIAGNOSIS — Z950 Presence of cardiac pacemaker: Secondary | ICD-10-CM | POA: Diagnosis not present

## 2023-10-29 DIAGNOSIS — E66811 Obesity, class 1: Secondary | ICD-10-CM | POA: Diagnosis not present

## 2023-10-29 DIAGNOSIS — I129 Hypertensive chronic kidney disease with stage 1 through stage 4 chronic kidney disease, or unspecified chronic kidney disease: Secondary | ICD-10-CM | POA: Diagnosis not present

## 2023-10-29 DIAGNOSIS — N189 Chronic kidney disease, unspecified: Secondary | ICD-10-CM | POA: Diagnosis not present

## 2023-10-29 DIAGNOSIS — N400 Enlarged prostate without lower urinary tract symptoms: Secondary | ICD-10-CM | POA: Diagnosis not present

## 2023-10-29 DIAGNOSIS — Z853 Personal history of malignant neoplasm of breast: Secondary | ICD-10-CM | POA: Diagnosis not present

## 2023-10-29 DIAGNOSIS — I959 Hypotension, unspecified: Secondary | ICD-10-CM | POA: Diagnosis not present

## 2023-10-29 DIAGNOSIS — I69354 Hemiplegia and hemiparesis following cerebral infarction affecting left non-dominant side: Secondary | ICD-10-CM | POA: Diagnosis not present

## 2023-10-29 DIAGNOSIS — K219 Gastro-esophageal reflux disease without esophagitis: Secondary | ICD-10-CM | POA: Diagnosis not present

## 2023-10-29 DIAGNOSIS — Z7984 Long term (current) use of oral hypoglycemic drugs: Secondary | ICD-10-CM | POA: Diagnosis not present

## 2023-10-29 DIAGNOSIS — Z6826 Body mass index (BMI) 26.0-26.9, adult: Secondary | ICD-10-CM | POA: Diagnosis not present

## 2023-10-29 DIAGNOSIS — Z8616 Personal history of COVID-19: Secondary | ICD-10-CM | POA: Diagnosis not present

## 2023-10-29 DIAGNOSIS — Z7901 Long term (current) use of anticoagulants: Secondary | ICD-10-CM | POA: Diagnosis not present

## 2023-10-29 DIAGNOSIS — R296 Repeated falls: Secondary | ICD-10-CM | POA: Diagnosis not present

## 2023-10-29 DIAGNOSIS — Z7902 Long term (current) use of antithrombotics/antiplatelets: Secondary | ICD-10-CM | POA: Diagnosis not present

## 2023-10-29 DIAGNOSIS — K589 Irritable bowel syndrome without diarrhea: Secondary | ICD-10-CM | POA: Diagnosis not present

## 2023-10-29 DIAGNOSIS — H43819 Vitreous degeneration, unspecified eye: Secondary | ICD-10-CM | POA: Diagnosis not present

## 2023-10-29 DIAGNOSIS — G4700 Insomnia, unspecified: Secondary | ICD-10-CM | POA: Diagnosis not present

## 2023-10-29 DIAGNOSIS — E43 Unspecified severe protein-calorie malnutrition: Secondary | ICD-10-CM | POA: Diagnosis not present

## 2023-11-02 ENCOUNTER — Encounter (HOSPITAL_BASED_OUTPATIENT_CLINIC_OR_DEPARTMENT_OTHER): Payer: Self-pay

## 2023-11-02 ENCOUNTER — Emergency Department (HOSPITAL_BASED_OUTPATIENT_CLINIC_OR_DEPARTMENT_OTHER)
Admission: EM | Admit: 2023-11-02 | Discharge: 2023-11-02 | Disposition: A | Discharge status: Home / Self Care | Attending: Emergency Medicine | Admitting: Emergency Medicine

## 2023-11-02 ENCOUNTER — Other Ambulatory Visit: Payer: Self-pay

## 2023-11-02 DIAGNOSIS — Z466 Encounter for fitting and adjustment of urinary device: Secondary | ICD-10-CM | POA: Diagnosis not present

## 2023-11-02 DIAGNOSIS — Z7901 Long term (current) use of anticoagulants: Secondary | ICD-10-CM | POA: Diagnosis not present

## 2023-11-02 DIAGNOSIS — R739 Hyperglycemia, unspecified: Secondary | ICD-10-CM | POA: Diagnosis not present

## 2023-11-02 DIAGNOSIS — N390 Urinary tract infection, site not specified: Secondary | ICD-10-CM | POA: Insufficient documentation

## 2023-11-02 DIAGNOSIS — Z794 Long term (current) use of insulin: Secondary | ICD-10-CM | POA: Insufficient documentation

## 2023-11-02 DIAGNOSIS — T83091A Other mechanical complication of indwelling urethral catheter, initial encounter: Secondary | ICD-10-CM | POA: Diagnosis not present

## 2023-11-02 LAB — CBC WITH DIFFERENTIAL/PLATELET
Abs Immature Granulocytes: 0.03 K/uL (ref 0.00–0.07)
Basophils Absolute: 0.1 K/uL (ref 0.0–0.1)
Basophils Relative: 1 %
Eosinophils Absolute: 0.2 K/uL (ref 0.0–0.5)
Eosinophils Relative: 2 %
HCT: 36.1 % — ABNORMAL LOW (ref 39.0–52.0)
Hemoglobin: 11.3 g/dL — ABNORMAL LOW (ref 13.0–17.0)
Immature Granulocytes: 0 %
Lymphocytes Relative: 21 %
Lymphs Abs: 2 K/uL (ref 0.7–4.0)
MCH: 26.8 pg (ref 26.0–34.0)
MCHC: 31.3 g/dL (ref 30.0–36.0)
MCV: 85.5 fL (ref 80.0–100.0)
Monocytes Absolute: 0.7 K/uL (ref 0.1–1.0)
Monocytes Relative: 7 %
Neutro Abs: 6.7 K/uL (ref 1.7–7.7)
Neutrophils Relative %: 69 %
Platelets: 261 K/uL (ref 150–400)
RBC: 4.22 MIL/uL (ref 4.22–5.81)
RDW: 19.5 % — ABNORMAL HIGH (ref 11.5–15.5)
WBC: 9.5 K/uL (ref 4.0–10.5)
nRBC: 0 % (ref 0.0–0.2)

## 2023-11-02 LAB — URINALYSIS, ROUTINE W REFLEX MICROSCOPIC
Bilirubin Urine: NEGATIVE
Glucose, UA: 100 mg/dL — AB
Ketones, ur: NEGATIVE mg/dL
Nitrite: NEGATIVE
Protein, ur: 30 mg/dL — AB
Specific Gravity, Urine: 1.015 (ref 1.005–1.030)
pH: 7 (ref 5.0–8.0)

## 2023-11-02 LAB — URINALYSIS, MICROSCOPIC (REFLEX): WBC, UA: >50 WBC/hpf (ref 0–5)

## 2023-11-02 LAB — COMPREHENSIVE METABOLIC PANEL WITH GFR
ALT: 15 U/L (ref 0–44)
AST: 21 U/L (ref 15–41)
Albumin: 4.1 g/dL (ref 3.5–5.0)
Alkaline Phosphatase: 59 U/L (ref 38–126)
Anion gap: 13 (ref 5–15)
BUN: 11 mg/dL (ref 8–23)
CO2: 25 mmol/L (ref 22–32)
Calcium: 9.8 mg/dL (ref 8.9–10.3)
Chloride: 100 mmol/L (ref 98–111)
Creatinine, Ser: 1.06 mg/dL (ref 0.61–1.24)
GFR, Estimated: >60 mL/min (ref >60)
Glucose, Bld: 233 mg/dL — ABNORMAL HIGH (ref 70–99)
Potassium: 4 mmol/L (ref 3.5–5.1)
Sodium: 138 mmol/L (ref 135–145)
Total Bilirubin: 1.5 mg/dL — ABNORMAL HIGH (ref 0.0–1.2)
Total Protein: 6.4 g/dL — ABNORMAL LOW (ref 6.5–8.1)

## 2023-11-02 NOTE — ED Provider Notes (Signed)
 [place] [facility]GENCY [facility] AT [facility] Provider Note   CSN: [id number] Arrival date & time: 11/02/23  1424     Patient presents with: cath issue   John [name] is a 78 y.o. male.   Patient complains that his Foley stopped draining around 11:00 today.  Patient states that the urine in his bag is from before 11:00.  He states he is not currently having any drainage.  Patient's wife believes that the Foley catheter is clogged.  Patient reports he has been having some discomfort in his abdomen.  He reports this happened before coming to the emergency department he is not currently having any discomfort.  Patient is currently on an antibiotic for a urinary tract infection.  He still has several days of medication to take.  Patient's wife reports he is supposed to follow-up with [facility] for evaluation of his prostate.  Patient denies any fever or chills he denies any current abdominal pain.  The history is provided by the patient and the spouse.       Prior to Admission medications   Medication Sig Start Date End Date Taking? Authorizing Provider  acetaminophen  (TYLENOL ) 650 MG CR tablet Take 1 tablet (650 mg total) by mouth every 8 (eight) hours as needed for pain. 05/14/23   Zhao, Xika, NP  Ascorbic Acid  (VITAMIN C) 1000 MG tablet Take 1,000 mg by mouth 3 (three) times a week.    [provider]  atorvastatin  (LIPITOR ) 80 MG tablet Take 1 tablet (80 mg total) by mouth at bedtime. 09/14/23   [name], DO  cephALEXin  (KEFLEX ) 500 MG capsule Take 1 capsule (500 mg total) by mouth 4 (four) times daily for 7 days. 10/26/23 11/02/23  Kammerer, Megan L, DO  clopidogrel  (PLAVIX ) 75 MG tablet Take 1 tablet (75 mg total) by mouth daily with breakfast. 10/11/23   [name], DO  clotrimazole -betamethasone  (LOTRISONE ) cream Apply 1 Application topically daily. 07/11/23   [name], DO  cyanocobalamin  1000 MCG tablet Take 1 tablet  (1,000 mcg total) by mouth daily. 09/14/23   [name], DO  escitalopram  (LEXAPRO ) 10 MG tablet Take 1 tablet (10 mg total) by mouth daily. 10/03/23   [name], DO  ferrous sulfate  325 (65 FE) MG tablet Take 1 tablet (325 mg total) by mouth daily with breakfast. 09/14/23   Frann, Mabel Mt, DO  finasteride  (PROSCAR ) 5 MG tablet Take 1 tablet (5 mg total) by mouth daily. *Need appointment for future refills.* 09/14/23   [name], DO  hyoscyamine  (LEVSIN  SL) 0.125 MG SL tablet Place 1 tablet (0.125 mg total) under the tongue every 4 (four) hours as needed for bladder spasms. 08/17/23   Angiulli, Toribio PARAS, PA-C  insulin  glargine (LANTUS ) 100 UNIT/ML injection Inject 0.1 mLs (10 Units total) into the skin daily for 7 days. 10/26/23 02/03/24  Kammerer, Megan L, DO  loperamide  (IMODIUM ) 2 MG capsule Take 1 capsule (2 mg total) by mouth as needed for diarrhea or loose stools. 08/18/23   Maurice Sharlet RAMAN, PA-C  metFORMIN  (GLUCOPHAGE ) 500 MG tablet Take 1 tablet (500 mg total) by mouth 2 (two) times daily with a meal. 09/14/23   Wendling, Mabel Mt, DO  midodrine  (PROAMATINE ) 10 MG tablet Take 1 tablet (10 mg total) by mouth 3 (three) times daily with meals. *Need appointment for future refills.* 09/30/23   Meng, Hao, PA  Multiple Vitamin (MULTIVITAMIN) tablet Take 1 tablet by mouth 2 (two) times  a week.    [provider]  pantoprazole  (PROTONIX ) 40 MG tablet Take 1 tablet (40 mg total) by mouth at bedtime. 08/17/23   Angiulli, Toribio PARAS, PA-C  amLODipine  (NORVASC ) 2.5 MG tablet Take 1 tablet (2.5 mg total) by mouth daily. 06/15/23 06/30/23  [name], DO  apixaban  (ELIQUIS ) 5 MG TABS tablet Take 1 tablet (5 mg total) by mouth 2 (two) times daily. 10/11/23 10/11/23  [name], DO    Allergies: Ramipril, Other, Sertraline, and Adhesive [tape]    Review of Systems  Genitourinary:  Positive for decreased urine volume.  All other systems  reviewed and are negative.   Updated Vital Signs BP (!) 193/76   Pulse 60   Temp (!) 97.5 F (36.4 C) (Oral)   Resp 16   Ht 6' (1.829 m)   Wt 88.9 kg   SpO2 100%   BMI 26.58 kg/m   Physical Exam Vitals and nursing note reviewed.  Constitutional:      Appearance: He is well-developed.  HENT:     Head: Normocephalic.  Cardiovascular:     Rate and Rhythm: Normal rate.  Pulmonary:     Effort: Pulmonary effort is normal.  Abdominal:     General: Abdomen is flat. There is no distension.     Palpations: Abdomen is soft.  Musculoskeletal:        General: Normal range of motion.     Cervical back: Normal range of motion.  Skin:    General: Skin is warm.  Neurological:     General: No focal deficit present.     Mental Status: He is alert and oriented to person, place, and time.     (all labs ordered are listed, but only abnormal results are displayed) Labs Reviewed  URINALYSIS, ROUTINE W REFLEX MICROSCOPIC - Abnormal; Notable for the following components:      Result Value   APPearance HAZY (*)    Glucose, UA 100 (*)    Hgb urine dipstick LARGE (*)    Protein, ur 30 (*)    Leukocytes,Ua LARGE (*)    All other components within normal limits  CBC WITH DIFFERENTIAL/PLATELET - Abnormal; Notable for the following components:   Hemoglobin 11.3 (*)    HCT 36.1 (*)    RDW 19.5 (*)    All other components within normal limits  COMPREHENSIVE METABOLIC PANEL WITH GFR - Abnormal; Notable for the following components:   Glucose, Bld 233 (*)    Total Protein 6.4 (*)    Total Bilirubin 1.5 (*)    All other components within normal limits  URINALYSIS, MICROSCOPIC (REFLEX) - Abnormal; Notable for the following components:   Bacteria, UA FEW (*)    All other components within normal limits  URINE CULTURE    EKG: None  Radiology: No results found.   Procedures   Medications Ordered in the ED - No data to display  Clinical Course as of 11/02/23 1836  Wed Nov 02, 2023   1828 Comprehensive metabolic panel(!) [LS]    Clinical Course User Index [LS] [place] Sonny POUR, [place]                                 Medical Decision Making Patient complains that Foley is not draining.  He reports discomfort from the Foley.  He is currently being treated for urinary tract infection  Amount and/or Complexity of Data Reviewed Independent Historian:  spouse    Details: Patient's wife is here with patient he defers most of the history to her. External Data Reviewed: notes.    Details: Previous notes reviewed, Labs: ordered. Decision-making details documented in ED Course.    Details: UA shows few bacteria.  CBC hemoglobin is 11.3 white blood cell count is normal glucose is elevated at 233.  Risk Risk Details: Foley catheter and Foley bag are changed UA is obtained from fresh sample.  Urine culture ordered.  Patient reports urine is flowing and he feels better.  He is not currently having any discomfort.  Patient does have medicine at home for bladder spasm per his wife.  I advised them to use this medication if he has discomfort.  Patient should follow-up with urology for recheck.        Final diagnoses:  Encounter for Foley catheter replacement    ED Discharge Orders     None      An After Visit Summary was printed and given to the patient.     Flint Sonny POUR, [place] 11/02/23 1841    Long, Joshua G, MD 11/04/23 1049

## 2023-11-02 NOTE — ED Triage Notes (Signed)
 Pt reports that he is unable to drain his urine from his catheter. States that something hsa it blocked. States that the urine in the bag is from 0330. Wife at bedside. States that the Urologist gave her something to clean the cath out with but it doesn't work. Pt seen here many times for the same reason. Pt denies pain. Pt was seen here on 07/16 for a bladder infection.

## 2023-11-02 NOTE — Discharge Instructions (Addendum)
 Continue antibiotic.  Follow up with Urology.  You have a urine culture pending.

## 2023-11-03 ENCOUNTER — Ambulatory Visit: Admitting: Family Medicine

## 2023-11-03 LAB — URINE CULTURE
Culture: >=100000 — AB
Special Requests: NORMAL

## 2023-11-04 ENCOUNTER — Telehealth (HOSPITAL_BASED_OUTPATIENT_CLINIC_OR_DEPARTMENT_OTHER): Payer: Self-pay

## 2023-11-04 NOTE — Telephone Encounter (Signed)
 Post ED Visit - Positive Culture Follow-up  Culture report reviewed by antimicrobial stewardship pharmacist: [facility] []  Rankin Dee, Pharm.D. []  Venetia Gully, Pharm.D., BCPS AQ-ID []  Garrel Crews, Pharm.D., BCPS []  Almarie Lunger, [address].D., BCPS []  [place], [place].D., BCPS, AAHIVP []  Rosaline Bihari, Pharm.D., BCPS, AAHIVP [x]  Vernell Meier, PharmD, BCPS []  Latanya Hint, PharmD, BCPS []  Donald Medley, PharmD, BCPS []  Rocky Bold, PharmD []  Dorothyann Alert, PharmD, BCPS []  Morene Babe, PharmD  [facility] []  Rosaline Edison, PharmD []  Romona Bliss, PharmD []  Dolphus Roller, PharmD []  Veva Seip, Rph []  Vernell Daunt) Leonce, PharmD []  Eva Allis, PharmD []  Rosaline Millet, PharmD []  Iantha Batch, PharmD []  Arvin Gauss, PharmD []  Wanda Hasting, PharmD []  Ronal Rav, PharmD []  Rocky Slade, PharmD []  Bard Jeans, PharmD   Positive urine culture Chronic foley, 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  John [name] 11/04/2023, 11:56 AM

## 2023-11-07 ENCOUNTER — Ambulatory Visit: Admitting: Family Medicine

## 2023-11-08 ENCOUNTER — Telehealth: Payer: Self-pay | Admitting: Pharmacist

## 2023-11-08 ENCOUNTER — Encounter: Payer: Self-pay | Admitting: Pharmacist

## 2023-11-08 NOTE — Progress Notes (Addendum)
 Pharmacy Quality Measure Review  This patient is appearing on a report for being at risk of failing the adherence measure for cholesterol (statin) medications this calendar year.   Medication: rosuvastatin  Last fill date: 08/17/2023 for 30 day supply per adherence report.   Reviewed recent refill history in Dr Annemarie database. Actual last refill date was 09/14/2023 for 90 day supply. Patient has 1 refill remaining. Next appointment with PCP ha snot been scheduled yet.   I also noted patient's last A1c was not at goal. His last A1c was 9.0% - from outside source on 10/11/2023.   Patient filled metformin  09/19/2023 for 90 DS He also is due to see Dr Frann Had appointment last week but was canceled.   Tried to outreach patient to review home blood glucose and discuss adherence. No Answer. LM on VM with my direct number [phone] and office number to call for appt [phone]  Insurance report was not up to date for rosuvastatin refills.   Madelin Ray, PharmD Clinical Pharmacist [facility]  Population Health [phone]  Addendum 11/08/2023 Patient's wife called back. She states patient has not been checking blood glucose at home. He refuses he let her do fingerstick blood glucose checks and she states he has not been able to get Continuous Glucose Monitor sensors to stay on him.   She would like to reschedule his missed appointment - scheduled for 11/11/2023 at 2:15pm.   Madelin Ray, PharmD Clinical Pharmacist [facility]  Population Health [phone]

## 2023-11-08 NOTE — Telephone Encounter (Signed)
 In reviewing patient's adherence noticed that his last A1c was elevated at 9.0% - from outside source on 10/11/2023.   Patient has not filled metformin  in several months. He also is due to see Dr Frann in September but have not been scheduled yet.   Tried to outreach patient to review home blood glucose and discuss adherence. No Answer. LM on VM with my direct number [phone] and office number to call for appt [phone]

## 2023-11-10 DIAGNOSIS — N401 Enlarged prostate with lower urinary tract symptoms: Secondary | ICD-10-CM | POA: Diagnosis not present

## 2023-11-10 DIAGNOSIS — R338 Other retention of urine: Secondary | ICD-10-CM | POA: Diagnosis not present

## 2023-11-11 ENCOUNTER — Other Ambulatory Visit: Payer: Self-pay

## 2023-11-11 ENCOUNTER — Encounter: Payer: Self-pay | Admitting: Family Medicine

## 2023-11-11 ENCOUNTER — Ambulatory Visit: Admitting: Family Medicine

## 2023-11-11 ENCOUNTER — Other Ambulatory Visit (HOSPITAL_BASED_OUTPATIENT_CLINIC_OR_DEPARTMENT_OTHER): Payer: Self-pay

## 2023-11-11 VITALS — BP 128/86 | HR 78 | Temp 98.0°F | Resp 16 | Ht 73.0 in | Wt 192.4 lb

## 2023-11-11 DIAGNOSIS — E139 Other specified diabetes mellitus without complications: Secondary | ICD-10-CM | POA: Diagnosis not present

## 2023-11-11 MED ORDER — FLUCONAZOLE 100 MG PO TABS
100.0000 mg | ORAL_TABLET | Freq: Every day | ORAL | 0 refills | Status: DC
  Filled 2023-11-11 (×2): qty 7, 7d supply, fill #0

## 2023-11-11 MED ORDER — INSULIN GLARGINE 100 UNIT/ML ~~LOC~~ SOLN: 15.0000 [IU] | Freq: Every day | SUBCUTANEOUS | Status: DC

## 2023-11-11 NOTE — Progress Notes (Signed)
 Subjective:   Chief Complaint  Patient presents with   Follow-up    ER follow up    John [name] is a 78 y.o. male here for follow-up of diabetes.  He is here with his wife. Teigen's self monitored glucose range is low 200s's.  Patient denies hypoglycemic reactions. He checks his glucose levels 1 time(s) per day. Patient is on Lantus  10 units nightly. Medications include: Metformin  500 mg twice daily Diet could be better, appetite is improving.  Exercise: Some walking No chest pain or shortness of breath  Past Medical History:  Diagnosis Date   BPH (benign prostatic hyperplasia)    CTS (carpal tunnel syndrome)    Depression    Diabetes mellitus    Essential hypertension    GERD (gastroesophageal reflux disease)    History of breast cancer    Hypercholesteremia    Neuropathy    Stroke (HCC)      Related testing: Retinal exam: Done Pneumovax: done  Objective:  BP 128/86 (BP Location: Left Arm, Patient Position: Sitting)   Pulse 78   Temp 98 F (36.7 C) (Oral)   Resp 16   Ht 6' 1 (1.854 m)   Wt 192 lb 6.4 oz (87.3 kg)   SpO2 95%   BMI 25.38 kg/m  General:  Well developed, well nourished, in no apparent distress Lungs:  CTAB, no access msc use Cardio:  RRR, no bruits, no LE edema Psych: Age appropriate judgment and insight  Assessment:   Diabetes 1.5, managed as type 2 (HCC)   Plan:   Chronic, not controlled.  Continue metformin  500 mg twice daily.  Increase Lantus  from 10 units nightly to 15 units nightly.  Send message in 2 weeks with some readings.  Counseled on diet and exercise. The patient and his spouse voiced understanding and agreement to the plan.  Mabel Mt [place], DO 11/11/23 4:22 PM

## 2023-11-11 NOTE — Patient Instructions (Addendum)
 Check your sugars around 2-3 times per week. Alternate checking in the morning before you eat, in the afternoon and before bed. Write them down and bring it to your next appointment.   Send me a message in 2 weeks with your sugar readings taking the 15 units at night.   Keep the diet clean and stay active.  Let us  know if you need anything.

## 2023-11-14 ENCOUNTER — Other Ambulatory Visit: Payer: Self-pay | Admitting: Family Medicine

## 2023-11-14 ENCOUNTER — Other Ambulatory Visit (HOSPITAL_BASED_OUTPATIENT_CLINIC_OR_DEPARTMENT_OTHER): Payer: Self-pay

## 2023-11-14 MED ORDER — PANTOPRAZOLE SODIUM 40 MG PO TBEC
40.0000 mg | DELAYED_RELEASE_TABLET | Freq: Every day | ORAL | 0 refills | Status: DC
  Filled 2023-11-14: qty 30, 30d supply, fill #0

## 2023-11-15 ENCOUNTER — Other Ambulatory Visit (HOSPITAL_BASED_OUTPATIENT_CLINIC_OR_DEPARTMENT_OTHER): Payer: Self-pay

## 2023-11-18 DIAGNOSIS — K219 Gastro-esophageal reflux disease without esophagitis: Secondary | ICD-10-CM | POA: Diagnosis not present

## 2023-11-18 DIAGNOSIS — Z853 Personal history of malignant neoplasm of breast: Secondary | ICD-10-CM | POA: Diagnosis not present

## 2023-11-18 DIAGNOSIS — N189 Chronic kidney disease, unspecified: Secondary | ICD-10-CM | POA: Diagnosis not present

## 2023-11-18 DIAGNOSIS — K589 Irritable bowel syndrome without diarrhea: Secondary | ICD-10-CM | POA: Diagnosis not present

## 2023-11-18 DIAGNOSIS — I69354 Hemiplegia and hemiparesis following cerebral infarction affecting left non-dominant side: Secondary | ICD-10-CM | POA: Diagnosis not present

## 2023-11-18 DIAGNOSIS — E43 Unspecified severe protein-calorie malnutrition: Secondary | ICD-10-CM | POA: Diagnosis not present

## 2023-11-18 DIAGNOSIS — Z7984 Long term (current) use of oral hypoglycemic drugs: Secondary | ICD-10-CM | POA: Diagnosis not present

## 2023-11-18 DIAGNOSIS — H43819 Vitreous degeneration, unspecified eye: Secondary | ICD-10-CM | POA: Diagnosis not present

## 2023-11-18 DIAGNOSIS — E1142 Type 2 diabetes mellitus with diabetic polyneuropathy: Secondary | ICD-10-CM | POA: Diagnosis not present

## 2023-11-18 DIAGNOSIS — E7800 Pure hypercholesterolemia, unspecified: Secondary | ICD-10-CM | POA: Diagnosis not present

## 2023-11-18 DIAGNOSIS — Z950 Presence of cardiac pacemaker: Secondary | ICD-10-CM | POA: Diagnosis not present

## 2023-11-18 DIAGNOSIS — Z7901 Long term (current) use of anticoagulants: Secondary | ICD-10-CM | POA: Diagnosis not present

## 2023-11-18 DIAGNOSIS — N400 Enlarged prostate without lower urinary tract symptoms: Secondary | ICD-10-CM | POA: Diagnosis not present

## 2023-11-18 DIAGNOSIS — D631 Anemia in chronic kidney disease: Secondary | ICD-10-CM | POA: Diagnosis not present

## 2023-11-18 DIAGNOSIS — F325 Major depressive disorder, single episode, in full remission: Secondary | ICD-10-CM | POA: Diagnosis not present

## 2023-11-18 DIAGNOSIS — E1122 Type 2 diabetes mellitus with diabetic chronic kidney disease: Secondary | ICD-10-CM | POA: Diagnosis not present

## 2023-11-18 DIAGNOSIS — I959 Hypotension, unspecified: Secondary | ICD-10-CM | POA: Diagnosis not present

## 2023-11-18 DIAGNOSIS — Z7902 Long term (current) use of antithrombotics/antiplatelets: Secondary | ICD-10-CM | POA: Diagnosis not present

## 2023-11-18 DIAGNOSIS — Z6826 Body mass index (BMI) 26.0-26.9, adult: Secondary | ICD-10-CM | POA: Diagnosis not present

## 2023-11-18 DIAGNOSIS — G5600 Carpal tunnel syndrome, unspecified upper limb: Secondary | ICD-10-CM | POA: Diagnosis not present

## 2023-11-18 DIAGNOSIS — E66811 Obesity, class 1: Secondary | ICD-10-CM | POA: Diagnosis not present

## 2023-11-18 DIAGNOSIS — I129 Hypertensive chronic kidney disease with stage 1 through stage 4 chronic kidney disease, or unspecified chronic kidney disease: Secondary | ICD-10-CM | POA: Diagnosis not present

## 2023-11-18 DIAGNOSIS — G4700 Insomnia, unspecified: Secondary | ICD-10-CM | POA: Diagnosis not present

## 2023-11-18 DIAGNOSIS — Z8616 Personal history of COVID-19: Secondary | ICD-10-CM | POA: Diagnosis not present

## 2023-11-18 DIAGNOSIS — R296 Repeated falls: Secondary | ICD-10-CM | POA: Diagnosis not present

## 2023-11-21 DIAGNOSIS — E43 Unspecified severe protein-calorie malnutrition: Secondary | ICD-10-CM | POA: Diagnosis not present

## 2023-11-21 DIAGNOSIS — R296 Repeated falls: Secondary | ICD-10-CM | POA: Diagnosis not present

## 2023-11-21 DIAGNOSIS — Z8616 Personal history of COVID-19: Secondary | ICD-10-CM | POA: Diagnosis not present

## 2023-11-21 DIAGNOSIS — G4700 Insomnia, unspecified: Secondary | ICD-10-CM | POA: Diagnosis not present

## 2023-11-21 DIAGNOSIS — I69354 Hemiplegia and hemiparesis following cerebral infarction affecting left non-dominant side: Secondary | ICD-10-CM | POA: Diagnosis not present

## 2023-11-21 DIAGNOSIS — Z950 Presence of cardiac pacemaker: Secondary | ICD-10-CM | POA: Diagnosis not present

## 2023-11-21 DIAGNOSIS — Z853 Personal history of malignant neoplasm of breast: Secondary | ICD-10-CM | POA: Diagnosis not present

## 2023-11-21 DIAGNOSIS — I129 Hypertensive chronic kidney disease with stage 1 through stage 4 chronic kidney disease, or unspecified chronic kidney disease: Secondary | ICD-10-CM | POA: Diagnosis not present

## 2023-11-21 DIAGNOSIS — E1142 Type 2 diabetes mellitus with diabetic polyneuropathy: Secondary | ICD-10-CM | POA: Diagnosis not present

## 2023-11-21 DIAGNOSIS — K589 Irritable bowel syndrome without diarrhea: Secondary | ICD-10-CM | POA: Diagnosis not present

## 2023-11-21 DIAGNOSIS — E66811 Obesity, class 1: Secondary | ICD-10-CM | POA: Diagnosis not present

## 2023-11-21 DIAGNOSIS — H43819 Vitreous degeneration, unspecified eye: Secondary | ICD-10-CM | POA: Diagnosis not present

## 2023-11-21 DIAGNOSIS — D631 Anemia in chronic kidney disease: Secondary | ICD-10-CM | POA: Diagnosis not present

## 2023-11-21 DIAGNOSIS — Z6826 Body mass index (BMI) 26.0-26.9, adult: Secondary | ICD-10-CM | POA: Diagnosis not present

## 2023-11-21 DIAGNOSIS — E7800 Pure hypercholesterolemia, unspecified: Secondary | ICD-10-CM | POA: Diagnosis not present

## 2023-11-21 DIAGNOSIS — G5600 Carpal tunnel syndrome, unspecified upper limb: Secondary | ICD-10-CM | POA: Diagnosis not present

## 2023-11-21 DIAGNOSIS — Z7901 Long term (current) use of anticoagulants: Secondary | ICD-10-CM | POA: Diagnosis not present

## 2023-11-21 DIAGNOSIS — Z7902 Long term (current) use of antithrombotics/antiplatelets: Secondary | ICD-10-CM | POA: Diagnosis not present

## 2023-11-21 DIAGNOSIS — F325 Major depressive disorder, single episode, in full remission: Secondary | ICD-10-CM | POA: Diagnosis not present

## 2023-11-21 DIAGNOSIS — N189 Chronic kidney disease, unspecified: Secondary | ICD-10-CM | POA: Diagnosis not present

## 2023-11-21 DIAGNOSIS — N400 Enlarged prostate without lower urinary tract symptoms: Secondary | ICD-10-CM | POA: Diagnosis not present

## 2023-11-21 DIAGNOSIS — I959 Hypotension, unspecified: Secondary | ICD-10-CM | POA: Diagnosis not present

## 2023-11-21 DIAGNOSIS — E1122 Type 2 diabetes mellitus with diabetic chronic kidney disease: Secondary | ICD-10-CM | POA: Diagnosis not present

## 2023-11-21 DIAGNOSIS — K219 Gastro-esophageal reflux disease without esophagitis: Secondary | ICD-10-CM | POA: Diagnosis not present

## 2023-11-21 DIAGNOSIS — Z7984 Long term (current) use of oral hypoglycemic drugs: Secondary | ICD-10-CM | POA: Diagnosis not present

## 2023-11-23 DIAGNOSIS — Z853 Personal history of malignant neoplasm of breast: Secondary | ICD-10-CM | POA: Diagnosis not present

## 2023-11-23 DIAGNOSIS — E1142 Type 2 diabetes mellitus with diabetic polyneuropathy: Secondary | ICD-10-CM | POA: Diagnosis not present

## 2023-11-23 DIAGNOSIS — E7800 Pure hypercholesterolemia, unspecified: Secondary | ICD-10-CM | POA: Diagnosis not present

## 2023-11-23 DIAGNOSIS — Z950 Presence of cardiac pacemaker: Secondary | ICD-10-CM | POA: Diagnosis not present

## 2023-11-23 DIAGNOSIS — I69354 Hemiplegia and hemiparesis following cerebral infarction affecting left non-dominant side: Secondary | ICD-10-CM | POA: Diagnosis not present

## 2023-11-23 DIAGNOSIS — K219 Gastro-esophageal reflux disease without esophagitis: Secondary | ICD-10-CM | POA: Diagnosis not present

## 2023-11-23 DIAGNOSIS — H43819 Vitreous degeneration, unspecified eye: Secondary | ICD-10-CM | POA: Diagnosis not present

## 2023-11-23 DIAGNOSIS — Z7902 Long term (current) use of antithrombotics/antiplatelets: Secondary | ICD-10-CM | POA: Diagnosis not present

## 2023-11-23 DIAGNOSIS — G5600 Carpal tunnel syndrome, unspecified upper limb: Secondary | ICD-10-CM | POA: Diagnosis not present

## 2023-11-23 DIAGNOSIS — F325 Major depressive disorder, single episode, in full remission: Secondary | ICD-10-CM | POA: Diagnosis not present

## 2023-11-23 DIAGNOSIS — Z8616 Personal history of COVID-19: Secondary | ICD-10-CM | POA: Diagnosis not present

## 2023-11-23 DIAGNOSIS — K589 Irritable bowel syndrome without diarrhea: Secondary | ICD-10-CM | POA: Diagnosis not present

## 2023-11-23 DIAGNOSIS — E1122 Type 2 diabetes mellitus with diabetic chronic kidney disease: Secondary | ICD-10-CM | POA: Diagnosis not present

## 2023-11-23 DIAGNOSIS — N189 Chronic kidney disease, unspecified: Secondary | ICD-10-CM | POA: Diagnosis not present

## 2023-11-23 DIAGNOSIS — N400 Enlarged prostate without lower urinary tract symptoms: Secondary | ICD-10-CM | POA: Diagnosis not present

## 2023-11-23 DIAGNOSIS — I129 Hypertensive chronic kidney disease with stage 1 through stage 4 chronic kidney disease, or unspecified chronic kidney disease: Secondary | ICD-10-CM | POA: Diagnosis not present

## 2023-11-23 DIAGNOSIS — R296 Repeated falls: Secondary | ICD-10-CM | POA: Diagnosis not present

## 2023-11-23 DIAGNOSIS — Z7901 Long term (current) use of anticoagulants: Secondary | ICD-10-CM | POA: Diagnosis not present

## 2023-11-23 DIAGNOSIS — E66811 Obesity, class 1: Secondary | ICD-10-CM | POA: Diagnosis not present

## 2023-11-23 DIAGNOSIS — G4700 Insomnia, unspecified: Secondary | ICD-10-CM | POA: Diagnosis not present

## 2023-11-23 DIAGNOSIS — Z7984 Long term (current) use of oral hypoglycemic drugs: Secondary | ICD-10-CM | POA: Diagnosis not present

## 2023-11-23 DIAGNOSIS — I959 Hypotension, unspecified: Secondary | ICD-10-CM | POA: Diagnosis not present

## 2023-11-23 DIAGNOSIS — E43 Unspecified severe protein-calorie malnutrition: Secondary | ICD-10-CM | POA: Diagnosis not present

## 2023-11-23 DIAGNOSIS — D631 Anemia in chronic kidney disease: Secondary | ICD-10-CM | POA: Diagnosis not present

## 2023-11-23 DIAGNOSIS — Z6826 Body mass index (BMI) 26.0-26.9, adult: Secondary | ICD-10-CM | POA: Diagnosis not present

## 2023-11-25 ENCOUNTER — Ambulatory Visit: Payer: Self-pay

## 2023-11-25 ENCOUNTER — Encounter: Payer: Self-pay | Admitting: Family Medicine

## 2023-11-25 ENCOUNTER — Other Ambulatory Visit: Payer: Self-pay | Admitting: Family Medicine

## 2023-11-25 DIAGNOSIS — E66811 Obesity, class 1: Secondary | ICD-10-CM | POA: Diagnosis not present

## 2023-11-25 DIAGNOSIS — G4700 Insomnia, unspecified: Secondary | ICD-10-CM | POA: Diagnosis not present

## 2023-11-25 DIAGNOSIS — K219 Gastro-esophageal reflux disease without esophagitis: Secondary | ICD-10-CM | POA: Diagnosis not present

## 2023-11-25 DIAGNOSIS — I69354 Hemiplegia and hemiparesis following cerebral infarction affecting left non-dominant side: Secondary | ICD-10-CM | POA: Diagnosis not present

## 2023-11-25 DIAGNOSIS — E1122 Type 2 diabetes mellitus with diabetic chronic kidney disease: Secondary | ICD-10-CM | POA: Diagnosis not present

## 2023-11-25 DIAGNOSIS — H43819 Vitreous degeneration, unspecified eye: Secondary | ICD-10-CM | POA: Diagnosis not present

## 2023-11-25 DIAGNOSIS — E43 Unspecified severe protein-calorie malnutrition: Secondary | ICD-10-CM | POA: Diagnosis not present

## 2023-11-25 DIAGNOSIS — I129 Hypertensive chronic kidney disease with stage 1 through stage 4 chronic kidney disease, or unspecified chronic kidney disease: Secondary | ICD-10-CM | POA: Diagnosis not present

## 2023-11-25 DIAGNOSIS — Z8616 Personal history of COVID-19: Secondary | ICD-10-CM | POA: Diagnosis not present

## 2023-11-25 DIAGNOSIS — G5600 Carpal tunnel syndrome, unspecified upper limb: Secondary | ICD-10-CM | POA: Diagnosis not present

## 2023-11-25 DIAGNOSIS — D631 Anemia in chronic kidney disease: Secondary | ICD-10-CM | POA: Diagnosis not present

## 2023-11-25 DIAGNOSIS — N189 Chronic kidney disease, unspecified: Secondary | ICD-10-CM | POA: Diagnosis not present

## 2023-11-25 DIAGNOSIS — F325 Major depressive disorder, single episode, in full remission: Secondary | ICD-10-CM | POA: Diagnosis not present

## 2023-11-25 DIAGNOSIS — E7800 Pure hypercholesterolemia, unspecified: Secondary | ICD-10-CM | POA: Diagnosis not present

## 2023-11-25 DIAGNOSIS — Z6826 Body mass index (BMI) 26.0-26.9, adult: Secondary | ICD-10-CM | POA: Diagnosis not present

## 2023-11-25 DIAGNOSIS — Z7902 Long term (current) use of antithrombotics/antiplatelets: Secondary | ICD-10-CM | POA: Diagnosis not present

## 2023-11-25 DIAGNOSIS — N400 Enlarged prostate without lower urinary tract symptoms: Secondary | ICD-10-CM | POA: Diagnosis not present

## 2023-11-25 DIAGNOSIS — R296 Repeated falls: Secondary | ICD-10-CM | POA: Diagnosis not present

## 2023-11-25 DIAGNOSIS — K589 Irritable bowel syndrome without diarrhea: Secondary | ICD-10-CM | POA: Diagnosis not present

## 2023-11-25 DIAGNOSIS — Z7984 Long term (current) use of oral hypoglycemic drugs: Secondary | ICD-10-CM | POA: Diagnosis not present

## 2023-11-25 DIAGNOSIS — I959 Hypotension, unspecified: Secondary | ICD-10-CM | POA: Diagnosis not present

## 2023-11-25 DIAGNOSIS — Z950 Presence of cardiac pacemaker: Secondary | ICD-10-CM | POA: Diagnosis not present

## 2023-11-25 DIAGNOSIS — Z853 Personal history of malignant neoplasm of breast: Secondary | ICD-10-CM | POA: Diagnosis not present

## 2023-11-25 DIAGNOSIS — Z7901 Long term (current) use of anticoagulants: Secondary | ICD-10-CM | POA: Diagnosis not present

## 2023-11-25 DIAGNOSIS — E1142 Type 2 diabetes mellitus with diabetic polyneuropathy: Secondary | ICD-10-CM | POA: Diagnosis not present

## 2023-11-25 NOTE — Telephone Encounter (Signed)
 FYI Only or Action Required?: Action required by provider: clinical question for provider and update on patient condition.  Patient was last seen in primary care on 11/11/2023 by [name], DO.  Called Nurse Triage reporting Blood Sugar Problem.  Symptoms began several days ago.  Interventions attempted: Nothing.  Symptoms are: unchanged.  Triage Disposition: Call PCP Now  Patient/caregiver understands and will follow disposition?: Yes     Copied from CRM #[id number]. Topic: Clinical - Red Word Triage >> Nov 25, 2023  1:35 PM Leah C wrote: Red Word that prompted transfer to Nurse Triage: High blood sugar   Patient's spouse been taking patients blood sugar and sugar was 438.  Patient gave husband insulin  last night- should she give patient a shot now and how much? Reason for Disposition  Blood glucose > 400 mg/dL (77.7 mmol/L)  Answer Assessment - Initial Assessment Questions 1. BLOOD GLUCOSE: What is your blood glucose level?      438 2. ONSET: When did you check the blood glucose?     Today at 1305 3. USUAL RANGE: What is your glucose level usually? (e.g., usual fasting morning value, usual evening value)     unknown 4. KETONES: Do you check for ketones (urine or blood test strips)? If Yes, ask: What does the test show now?      no 5. TYPE 1 or 2:  Do you know what type of diabetes you have?  (e.g., Type 1, Type 2, Gestational; doesn't know)      Type 2  6. INSULIN : Do you take insulin ? What type of insulin (s) do you use? What is the mode of delivery? (syringe, pen; injection or pump)?      Lantus  15 u daily usually at bedtime took around 2100 last night 7. DIABETES PILLS: Do you take any pills for your diabetes? If Yes, ask: Have you missed taking any pills recently?     Metformin , has not missed any 8. OTHER SYMPTOMS: Do you have any symptoms? (e.g., fever, frequent urination, difficulty breathing, dizziness, weakness, vomiting)      no  Protocols used: Diabetes - High Blood Sugar-A-AH

## 2023-11-25 NOTE — Telephone Encounter (Signed)
 Pt wife called in stating pt BG is 438 when she took it at 1305 today she is questioning on should she give him the lantus  now instead of tonight. States that she has been checking his BG but it has been at random times as the pt gets up at random times.  8/9  206 8/10   215 8/12   264 8/14    290 Today   438  States that she doesn't know what or when he eat. States pt is not acting any different than normal.   Please advise. Can reach on [phone]

## 2023-11-28 ENCOUNTER — Other Ambulatory Visit (HOSPITAL_BASED_OUTPATIENT_CLINIC_OR_DEPARTMENT_OTHER): Payer: Self-pay

## 2023-11-28 MED ORDER — FINASTERIDE 5 MG PO TABS
5.0000 mg | ORAL_TABLET | Freq: Every day | ORAL | 0 refills | Status: DC
  Filled 2023-11-28: qty 30, 30d supply, fill #0

## 2023-11-28 MED ORDER — FERROUS SULFATE 325 (65 FE) MG PO TABS
325.0000 mg | ORAL_TABLET | Freq: Every day | ORAL | 0 refills | Status: DC
  Filled 2023-11-28: qty 100, 100d supply, fill #0

## 2023-12-04 ENCOUNTER — Encounter (HOSPITAL_COMMUNITY): Payer: Self-pay

## 2023-12-04 ENCOUNTER — Inpatient Hospital Stay (HOSPITAL_COMMUNITY)
Admission: EM | Admit: 2023-12-04 | Discharge: 2023-12-10 | DRG: 698 | Disposition: A | Discharge status: Home Health | Attending: Internal Medicine | Admitting: Internal Medicine

## 2023-12-04 ENCOUNTER — Emergency Department (HOSPITAL_COMMUNITY)

## 2023-12-04 ENCOUNTER — Other Ambulatory Visit: Payer: Self-pay

## 2023-12-04 DIAGNOSIS — I340 Nonrheumatic mitral (valve) insufficiency: Secondary | ICD-10-CM | POA: Diagnosis not present

## 2023-12-04 DIAGNOSIS — G9341 Metabolic encephalopathy: Secondary | ICD-10-CM | POA: Diagnosis present

## 2023-12-04 DIAGNOSIS — I2510 Atherosclerotic heart disease of native coronary artery without angina pectoris: Secondary | ICD-10-CM | POA: Diagnosis not present

## 2023-12-04 DIAGNOSIS — A409 Streptococcal sepsis, unspecified: Secondary | ICD-10-CM | POA: Diagnosis not present

## 2023-12-04 DIAGNOSIS — N401 Enlarged prostate with lower urinary tract symptoms: Secondary | ICD-10-CM | POA: Diagnosis present

## 2023-12-04 DIAGNOSIS — B955 Unspecified streptococcus as the cause of diseases classified elsewhere: Secondary | ICD-10-CM | POA: Diagnosis not present

## 2023-12-04 DIAGNOSIS — D509 Iron deficiency anemia, unspecified: Secondary | ICD-10-CM | POA: Diagnosis present

## 2023-12-04 DIAGNOSIS — N390 Urinary tract infection, site not specified: Secondary | ICD-10-CM | POA: Diagnosis present

## 2023-12-04 DIAGNOSIS — Z86711 Personal history of pulmonary embolism: Secondary | ICD-10-CM

## 2023-12-04 DIAGNOSIS — A408 Other streptococcal sepsis: Secondary | ICD-10-CM | POA: Diagnosis present

## 2023-12-04 DIAGNOSIS — I429 Cardiomyopathy, unspecified: Secondary | ICD-10-CM | POA: Diagnosis not present

## 2023-12-04 DIAGNOSIS — I700 Atherosclerosis of aorta: Secondary | ICD-10-CM | POA: Diagnosis not present

## 2023-12-04 DIAGNOSIS — I361 Nonrheumatic tricuspid (valve) insufficiency: Secondary | ICD-10-CM | POA: Diagnosis not present

## 2023-12-04 DIAGNOSIS — R6520 Severe sepsis without septic shock: Secondary | ICD-10-CM | POA: Diagnosis not present

## 2023-12-04 DIAGNOSIS — Z853 Personal history of malignant neoplasm of breast: Secondary | ICD-10-CM

## 2023-12-04 DIAGNOSIS — R197 Diarrhea, unspecified: Secondary | ICD-10-CM | POA: Diagnosis present

## 2023-12-04 DIAGNOSIS — F329 Major depressive disorder, single episode, unspecified: Secondary | ICD-10-CM | POA: Diagnosis present

## 2023-12-04 DIAGNOSIS — N400 Enlarged prostate without lower urinary tract symptoms: Secondary | ICD-10-CM | POA: Diagnosis present

## 2023-12-04 DIAGNOSIS — F325 Major depressive disorder, single episode, in full remission: Secondary | ICD-10-CM | POA: Diagnosis present

## 2023-12-04 DIAGNOSIS — I252 Old myocardial infarction: Secondary | ICD-10-CM | POA: Diagnosis not present

## 2023-12-04 DIAGNOSIS — Z1152 Encounter for screening for COVID-19: Secondary | ICD-10-CM

## 2023-12-04 DIAGNOSIS — Z87440 Personal history of urinary (tract) infections: Secondary | ICD-10-CM

## 2023-12-04 DIAGNOSIS — Z794 Long term (current) use of insulin: Secondary | ICD-10-CM | POA: Diagnosis not present

## 2023-12-04 DIAGNOSIS — I959 Hypotension, unspecified: Secondary | ICD-10-CM | POA: Diagnosis present

## 2023-12-04 DIAGNOSIS — Z8041 Family history of malignant neoplasm of ovary: Secondary | ICD-10-CM

## 2023-12-04 DIAGNOSIS — E1142 Type 2 diabetes mellitus with diabetic polyneuropathy: Secondary | ICD-10-CM | POA: Diagnosis not present

## 2023-12-04 DIAGNOSIS — Z7901 Long term (current) use of anticoagulants: Secondary | ICD-10-CM

## 2023-12-04 DIAGNOSIS — I4891 Unspecified atrial fibrillation: Secondary | ICD-10-CM | POA: Diagnosis not present

## 2023-12-04 DIAGNOSIS — I5022 Chronic systolic (congestive) heart failure: Secondary | ICD-10-CM | POA: Diagnosis present

## 2023-12-04 DIAGNOSIS — Z79899 Other long term (current) drug therapy: Secondary | ICD-10-CM

## 2023-12-04 DIAGNOSIS — R7881 Bacteremia: Secondary | ICD-10-CM | POA: Diagnosis present

## 2023-12-04 DIAGNOSIS — A419 Sepsis, unspecified organism: Secondary | ICD-10-CM | POA: Diagnosis not present

## 2023-12-04 DIAGNOSIS — R1111 Vomiting without nausea: Secondary | ICD-10-CM | POA: Diagnosis not present

## 2023-12-04 DIAGNOSIS — D71 Functional disorders of polymorphonuclear neutrophils: Secondary | ICD-10-CM | POA: Diagnosis not present

## 2023-12-04 DIAGNOSIS — N320 Bladder-neck obstruction: Secondary | ICD-10-CM | POA: Diagnosis present

## 2023-12-04 DIAGNOSIS — R4182 Altered mental status, unspecified: Secondary | ICD-10-CM | POA: Diagnosis not present

## 2023-12-04 DIAGNOSIS — Z978 Presence of other specified devices: Secondary | ICD-10-CM

## 2023-12-04 DIAGNOSIS — T83511A Infection and inflammatory reaction due to indwelling urethral catheter, initial encounter: Secondary | ICD-10-CM | POA: Diagnosis not present

## 2023-12-04 DIAGNOSIS — I110 Hypertensive heart disease with heart failure: Secondary | ICD-10-CM | POA: Diagnosis not present

## 2023-12-04 DIAGNOSIS — E7800 Pure hypercholesterolemia, unspecified: Secondary | ICD-10-CM | POA: Diagnosis not present

## 2023-12-04 DIAGNOSIS — R935 Abnormal findings on diagnostic imaging of other abdominal regions, including retroperitoneum: Secondary | ICD-10-CM | POA: Diagnosis not present

## 2023-12-04 DIAGNOSIS — I951 Orthostatic hypotension: Secondary | ICD-10-CM | POA: Diagnosis present

## 2023-12-04 DIAGNOSIS — R112 Nausea with vomiting, unspecified: Secondary | ICD-10-CM

## 2023-12-04 DIAGNOSIS — K8020 Calculus of gallbladder without cholecystitis without obstruction: Secondary | ICD-10-CM | POA: Diagnosis not present

## 2023-12-04 DIAGNOSIS — Z7902 Long term (current) use of antithrombotics/antiplatelets: Secondary | ICD-10-CM

## 2023-12-04 DIAGNOSIS — Z7984 Long term (current) use of oral hypoglycemic drugs: Secondary | ICD-10-CM

## 2023-12-04 DIAGNOSIS — B954 Other streptococcus as the cause of diseases classified elsewhere: Secondary | ICD-10-CM | POA: Diagnosis not present

## 2023-12-04 DIAGNOSIS — Y846 Urinary catheterization as the cause of abnormal reaction of the patient, or of later complication, without mention of misadventure at the time of the procedure: Secondary | ICD-10-CM | POA: Diagnosis present

## 2023-12-04 DIAGNOSIS — Z9221 Personal history of antineoplastic chemotherapy: Secondary | ICD-10-CM

## 2023-12-04 DIAGNOSIS — Z807 Family history of other malignant neoplasms of lymphoid, hematopoietic and related tissues: Secondary | ICD-10-CM

## 2023-12-04 DIAGNOSIS — I5020 Unspecified systolic (congestive) heart failure: Secondary | ICD-10-CM | POA: Diagnosis present

## 2023-12-04 DIAGNOSIS — E872 Acidosis, unspecified: Secondary | ICD-10-CM | POA: Diagnosis present

## 2023-12-04 DIAGNOSIS — J984 Other disorders of lung: Secondary | ICD-10-CM | POA: Diagnosis not present

## 2023-12-04 DIAGNOSIS — E1165 Type 2 diabetes mellitus with hyperglycemia: Secondary | ICD-10-CM | POA: Diagnosis present

## 2023-12-04 DIAGNOSIS — I63511 Cerebral infarction due to unspecified occlusion or stenosis of right middle cerebral artery: Secondary | ICD-10-CM | POA: Diagnosis present

## 2023-12-04 DIAGNOSIS — Z950 Presence of cardiac pacemaker: Secondary | ICD-10-CM | POA: Diagnosis not present

## 2023-12-04 DIAGNOSIS — K219 Gastro-esophageal reflux disease without esophagitis: Secondary | ICD-10-CM | POA: Diagnosis not present

## 2023-12-04 DIAGNOSIS — Z8673 Personal history of transient ischemic attack (TIA), and cerebral infarction without residual deficits: Secondary | ICD-10-CM

## 2023-12-04 DIAGNOSIS — I6523 Occlusion and stenosis of bilateral carotid arteries: Secondary | ICD-10-CM | POA: Diagnosis not present

## 2023-12-04 DIAGNOSIS — Z888 Allergy status to other drugs, medicaments and biological substances status: Secondary | ICD-10-CM

## 2023-12-04 DIAGNOSIS — Z91048 Other nonmedicinal substance allergy status: Secondary | ICD-10-CM

## 2023-12-04 DIAGNOSIS — Z66 Do not resuscitate: Secondary | ICD-10-CM | POA: Diagnosis not present

## 2023-12-04 DIAGNOSIS — Z823 Family history of stroke: Secondary | ICD-10-CM

## 2023-12-04 DIAGNOSIS — R4189 Other symptoms and signs involving cognitive functions and awareness: Secondary | ICD-10-CM | POA: Diagnosis present

## 2023-12-04 DIAGNOSIS — R739 Hyperglycemia, unspecified: Secondary | ICD-10-CM

## 2023-12-04 DIAGNOSIS — E1065 Type 1 diabetes mellitus with hyperglycemia: Secondary | ICD-10-CM | POA: Diagnosis not present

## 2023-12-04 DIAGNOSIS — R1110 Vomiting, unspecified: Secondary | ICD-10-CM | POA: Diagnosis not present

## 2023-12-04 DIAGNOSIS — Z833 Family history of diabetes mellitus: Secondary | ICD-10-CM

## 2023-12-04 DIAGNOSIS — R404 Transient alteration of awareness: Secondary | ICD-10-CM | POA: Diagnosis not present

## 2023-12-04 DIAGNOSIS — Z9011 Acquired absence of right breast and nipple: Secondary | ICD-10-CM

## 2023-12-04 DIAGNOSIS — Z955 Presence of coronary angioplasty implant and graft: Secondary | ICD-10-CM

## 2023-12-04 DIAGNOSIS — R338 Other retention of urine: Secondary | ICD-10-CM | POA: Diagnosis present

## 2023-12-04 DIAGNOSIS — R410 Disorientation, unspecified: Secondary | ICD-10-CM | POA: Diagnosis not present

## 2023-12-04 LAB — AMMONIA: Ammonia: 15 umol/L (ref 9–35)

## 2023-12-04 LAB — CBC WITH DIFFERENTIAL/PLATELET
Abs Immature Granulocytes: 0.05 K/uL (ref 0.00–0.07)
Basophils Absolute: 0.1 K/uL (ref 0.0–0.1)
Basophils Relative: 1 %
Eosinophils Absolute: 0.1 K/uL (ref 0.0–0.5)
Eosinophils Relative: 1 %
HCT: 37.6 % — ABNORMAL LOW (ref 39.0–52.0)
Hemoglobin: 12.1 g/dL — ABNORMAL LOW (ref 13.0–17.0)
Immature Granulocytes: 1 %
Lymphocytes Relative: 15 %
Lymphs Abs: 1.6 K/uL (ref 0.7–4.0)
MCH: 27.9 pg (ref 26.0–34.0)
MCHC: 32.2 g/dL (ref 30.0–36.0)
MCV: 86.8 fL (ref 80.0–100.0)
Monocytes Absolute: 0.6 K/uL (ref 0.1–1.0)
Monocytes Relative: 5 %
Neutro Abs: 8.6 K/uL — ABNORMAL HIGH (ref 1.7–7.7)
Neutrophils Relative %: 77 %
Platelets: 247 K/uL (ref 150–400)
RBC: 4.33 MIL/uL (ref 4.22–5.81)
RDW: 18.6 % — ABNORMAL HIGH (ref 11.5–15.5)
WBC: 11.1 K/uL — ABNORMAL HIGH (ref 4.0–10.5)
nRBC: 0 % (ref 0.0–0.2)

## 2023-12-04 LAB — COMPREHENSIVE METABOLIC PANEL WITH GFR
ALT: 22 U/L (ref 0–44)
AST: 26 U/L (ref 15–41)
Albumin: 3.4 g/dL — ABNORMAL LOW (ref 3.5–5.0)
Alkaline Phosphatase: 56 U/L (ref 38–126)
Anion gap: 15 (ref 5–15)
BUN: 13 mg/dL (ref 8–23)
CO2: 22 mmol/L (ref 22–32)
Calcium: 9.3 mg/dL (ref 8.9–10.3)
Chloride: 101 mmol/L (ref 98–111)
Creatinine, Ser: 1.07 mg/dL (ref 0.61–1.24)
GFR, Estimated: >60 mL/min (ref >60)
Glucose, Bld: 265 mg/dL — ABNORMAL HIGH (ref 70–99)
Potassium: 3.7 mmol/L (ref 3.5–5.1)
Sodium: 138 mmol/L (ref 135–145)
Total Bilirubin: 1.3 mg/dL — ABNORMAL HIGH (ref 0.0–1.2)
Total Protein: 6.2 g/dL — ABNORMAL LOW (ref 6.5–8.1)

## 2023-12-04 LAB — URINALYSIS, W/ REFLEX TO CULTURE (INFECTION SUSPECTED)
Bilirubin Urine: NEGATIVE
Glucose, UA: >=500 mg/dL — AB
Hgb urine dipstick: NEGATIVE
Ketones, ur: 5 mg/dL — AB
Nitrite: NEGATIVE
Protein, ur: NEGATIVE mg/dL
Specific Gravity, Urine: 1.01 (ref 1.005–1.030)
pH: 5 (ref 5.0–8.0)

## 2023-12-04 LAB — I-STAT CG4 LACTIC ACID, ED: Lactic Acid, Venous: 3.8 mmol/L (ref 0.5–1.9)

## 2023-12-04 LAB — RESP PANEL BY RT-PCR (RSV, FLU A&B, COVID)  RVPGX2
Influenza A by PCR: NEGATIVE
Influenza B by PCR: NEGATIVE
Resp Syncytial Virus by PCR: NEGATIVE
SARS Coronavirus 2 by RT PCR: NEGATIVE

## 2023-12-04 LAB — LIPASE, BLOOD: Lipase: 31 U/L (ref 11–51)

## 2023-12-04 LAB — BETA-HYDROXYBUTYRIC ACID: Beta-Hydroxybutyric Acid: 0.52 mmol/L — ABNORMAL HIGH (ref 0.05–0.27)

## 2023-12-04 LAB — LACTIC ACID, PLASMA: Lactic Acid, Venous: 4 mmol/L (ref 0.5–1.9)

## 2023-12-04 LAB — CBG MONITORING, ED: Glucose-Capillary: 253 mg/dL — ABNORMAL HIGH (ref 70–99)

## 2023-12-04 LAB — MAGNESIUM: Magnesium: 1 mg/dL — ABNORMAL LOW (ref 1.7–2.4)

## 2023-12-04 LAB — OSMOLALITY: Osmolality: 301 mosm/kg — ABNORMAL HIGH (ref 275–295)

## 2023-12-04 MED ORDER — MIDODRINE HCL 5 MG PO TABS
10.0000 mg | ORAL_TABLET | Freq: Three times a day (TID) | ORAL | Status: DC
Start: 2023-12-05 — End: 2023-12-10
  Administered 2023-12-05 – 2023-12-08 (×12): 10 mg via ORAL
  Filled 2023-12-04 (×14): qty 2

## 2023-12-04 MED ORDER — ACETAMINOPHEN 650 MG RE SUPP
650.0000 mg | Freq: Once | RECTAL | Status: AC
  Administered 2023-12-04: 650 mg via RECTAL
  Filled 2023-12-04: qty 1

## 2023-12-04 MED ORDER — VANCOMYCIN HCL IN DEXTROSE 1-5 GM/200ML-% IV SOLN: 1000.0000 mg | Freq: Once | INTRAVENOUS | Status: DC

## 2023-12-04 MED ORDER — INSULIN ASPART 100 UNIT/ML IJ SOLN
0.0000 [IU] | Freq: Three times a day (TID) | INTRAMUSCULAR | Status: DC
  Administered 2023-12-05: 1 [IU] via SUBCUTANEOUS
  Administered 2023-12-05: 2 [IU] via SUBCUTANEOUS
  Administered 2023-12-05: 1 [IU] via SUBCUTANEOUS
  Administered 2023-12-06: 5 [IU] via SUBCUTANEOUS
  Administered 2023-12-06: 3 [IU] via SUBCUTANEOUS
  Administered 2023-12-06: 9 [IU] via SUBCUTANEOUS
  Administered 2023-12-07: 3 [IU] via SUBCUTANEOUS
  Administered 2023-12-07: 2 [IU] via SUBCUTANEOUS
  Administered 2023-12-07: 5 [IU] via SUBCUTANEOUS
  Administered 2023-12-08 (×2): 2 [IU] via SUBCUTANEOUS

## 2023-12-04 MED ORDER — PANTOPRAZOLE SODIUM 40 MG PO TBEC
40.0000 mg | DELAYED_RELEASE_TABLET | Freq: Every day | ORAL | Status: DC
  Administered 2023-12-05 – 2023-12-09 (×5): 40 mg via ORAL
  Filled 2023-12-04 (×5): qty 1

## 2023-12-04 MED ORDER — SODIUM CHLORIDE 0.9 % IV BOLUS
250.0000 mL | Freq: Once | INTRAVENOUS | Status: AC
  Administered 2023-12-04: 250 mL via INTRAVENOUS

## 2023-12-04 MED ORDER — VITAMIN B-12 1000 MCG PO TABS
1000.0000 ug | ORAL_TABLET | Freq: Every day | ORAL | Status: DC
  Administered 2023-12-05 – 2023-12-10 (×6): 1000 ug via ORAL
  Filled 2023-12-04 (×6): qty 1

## 2023-12-04 MED ORDER — METRONIDAZOLE 500 MG/100ML IV SOLN
500.0000 mg | Freq: Once | INTRAVENOUS | Status: AC
  Administered 2023-12-04: 500 mg via INTRAVENOUS
  Filled 2023-12-04: qty 100

## 2023-12-04 MED ORDER — PANTOPRAZOLE SODIUM 40 MG IV SOLR
40.0000 mg | Freq: Once | INTRAVENOUS | Status: AC
  Administered 2023-12-04: 40 mg via INTRAVENOUS
  Filled 2023-12-04: qty 10

## 2023-12-04 MED ORDER — ACETAMINOPHEN 650 MG RE SUPP: 650.0000 mg | Freq: Four times a day (QID) | RECTAL | Status: DC | PRN

## 2023-12-04 MED ORDER — VANCOMYCIN HCL 1750 MG/350ML IV SOLN
1750.0000 mg | Freq: Once | INTRAVENOUS | Status: AC
  Administered 2023-12-04: 1750 mg via INTRAVENOUS
  Filled 2023-12-04: qty 350

## 2023-12-04 MED ORDER — ONDANSETRON HCL 4 MG/2ML IJ SOLN
4.0000 mg | Freq: Once | INTRAMUSCULAR | Status: AC
  Administered 2023-12-04: 4 mg via INTRAVENOUS
  Filled 2023-12-04: qty 2

## 2023-12-04 MED ORDER — FERROUS SULFATE 325 (65 FE) MG PO TABS
325.0000 mg | ORAL_TABLET | Freq: Every day | ORAL | Status: DC
  Administered 2023-12-05 – 2023-12-10 (×6): 325 mg via ORAL
  Filled 2023-12-04 (×6): qty 1

## 2023-12-04 MED ORDER — INSULIN GLARGINE 100 UNIT/ML ~~LOC~~ SOLN: 15.0000 [IU] | Freq: Every day | SUBCUTANEOUS | Status: DC

## 2023-12-04 MED ORDER — INSULIN GLARGINE 100 UNIT/ML ~~LOC~~ SOLN
15.0000 [IU] | Freq: Every day | SUBCUTANEOUS | Status: DC
  Administered 2023-12-05 – 2023-12-06 (×3): 15 [IU] via SUBCUTANEOUS
  Filled 2023-12-04 (×5): qty 0.15

## 2023-12-04 MED ORDER — IOHEXOL 350 MG/ML SOLN
75.0000 mL | Freq: Once | INTRAVENOUS | Status: AC | PRN
Start: 2023-12-04 — End: 2023-12-04
  Administered 2023-12-04: 75 mL via INTRAVENOUS

## 2023-12-04 MED ORDER — SODIUM CHLORIDE 0.9 % IV BOLUS
500.0000 mL | Freq: Once | INTRAVENOUS | Status: AC
  Administered 2023-12-04: 500 mL via INTRAVENOUS

## 2023-12-04 MED ORDER — FINASTERIDE 5 MG PO TABS
5.0000 mg | ORAL_TABLET | Freq: Every day | ORAL | Status: DC
Start: 2023-12-05 — End: 2023-12-10
  Administered 2023-12-05 – 2023-12-10 (×6): 5 mg via ORAL
  Filled 2023-12-04 (×6): qty 1

## 2023-12-04 MED ORDER — ENOXAPARIN SODIUM 40 MG/0.4ML IJ SOSY
40.0000 mg | PREFILLED_SYRINGE | INTRAMUSCULAR | Status: DC
  Administered 2023-12-05 – 2023-12-08 (×4): 40 mg via SUBCUTANEOUS
  Filled 2023-12-04 (×4): qty 0.4

## 2023-12-04 MED ORDER — SODIUM CHLORIDE 0.9 % IV SOLN
2.0000 g | Freq: Once | INTRAVENOUS | Status: AC
  Administered 2023-12-04: 2 g via INTRAVENOUS
  Filled 2023-12-04: qty 12.5

## 2023-12-04 MED ORDER — CLOPIDOGREL BISULFATE 75 MG PO TABS
75.0000 mg | ORAL_TABLET | Freq: Every day | ORAL | Status: DC
  Administered 2023-12-05 – 2023-12-10 (×6): 75 mg via ORAL
  Filled 2023-12-04 (×6): qty 1

## 2023-12-04 MED ORDER — ATORVASTATIN CALCIUM 80 MG PO TABS
80.0000 mg | ORAL_TABLET | Freq: Every day | ORAL | Status: DC
Start: 2023-12-05 — End: 2023-12-10
  Administered 2023-12-05 – 2023-12-09 (×5): 80 mg via ORAL
  Filled 2023-12-04 (×5): qty 1

## 2023-12-04 MED ORDER — MAGNESIUM SULFATE 2 GM/50ML IV SOLN
2.0000 g | Freq: Once | INTRAVENOUS | Status: AC
  Administered 2023-12-04: 2 g via INTRAVENOUS
  Filled 2023-12-04: qty 50

## 2023-12-04 MED ORDER — ACETAMINOPHEN 325 MG PO TABS
650.0000 mg | ORAL_TABLET | Freq: Four times a day (QID) | ORAL | Status: DC | PRN
  Administered 2023-12-05: 650 mg via ORAL
  Filled 2023-12-04: qty 2

## 2023-12-04 MED ORDER — SODIUM CHLORIDE 0.9 % IV SOLN
12.5000 mg | Freq: Four times a day (QID) | INTRAVENOUS | Status: DC | PRN
  Administered 2023-12-04: 12.5 mg via INTRAVENOUS
  Filled 2023-12-04: qty 0.5

## 2023-12-04 MED ORDER — ESCITALOPRAM OXALATE 10 MG PO TABS
10.0000 mg | ORAL_TABLET | Freq: Every day | ORAL | Status: DC
  Administered 2023-12-05 – 2023-12-10 (×6): 10 mg via ORAL
  Filled 2023-12-04 (×6): qty 1

## 2023-12-04 NOTE — ED Notes (Signed)
 Attempted to obtain 2nd PIV for antibiotic administration. Patient refused second line at this time.

## 2023-12-04 NOTE — ED Triage Notes (Signed)
 Pt bib EMS from home for intermittently altered x24 hrs per wife. Wife told EMS he has periodically not been acting like himself. Pt currently A+Ox4, GCS 15.  Emesis x2 PTA. 4mg  Zofran  IV given by EMS.   Hx stroke, DM.  CBG 256, 70, 188/80, 22  20 R forearm

## 2023-12-04 NOTE — ED Notes (Signed)
 Patient refused catheter change at this time.

## 2023-12-04 NOTE — ED Notes (Signed)
 [facility] called.

## 2023-12-04 NOTE — ED Notes (Signed)
 Patient transported to CT

## 2023-12-04 NOTE — H&P (Incomplete)
 History and Physical    John [name] FMW:[id number] DOB: [birth year] DOA: 12/04/2023  Patient coming from: Home.  Chief Complaint: Confusion.  HPI: John [name] is a 78 y.o. male with history of breast cancer status postmastectomy and chemotherapy, history of CAD status post DES, history of stroke in January 2025, history of PE completed course of Eliquis  6 months, diabetes mellitus type 2, chronic indwelling Foley catheter was brought to the ER after patient's wife noticed that patient has been in intermittent episodes of confusion over the last 24 hours.  Patient's wife also noted that he has been having some diarrhea recently taking some antibiotics.  I am unable to reach patient's wife at this time.    ED Course: In the ER patient had a temperature of 101 F lactic acid was 4 was given fluid bolus started on empiric antibiotics for sepsis UA is concerning for UTI likely source.  Since patient also has diarrhea stool studies have been ordered.  At the time of my exam patient is become more alert awake.  Lactic acid has improved.  Admitted for sepsis likely source could be UTI.  CT head was unremarkable.  CT chest abdomen pelvis did not show anything acute.  Review of Systems: As per HPI, rest all negative.   Past Medical History:  Diagnosis Date   BPH (benign prostatic hyperplasia)    CTS (carpal tunnel syndrome)    Depression    Diabetes mellitus    Essential hypertension    GERD (gastroesophageal reflux disease)    History of breast cancer    Hypercholesteremia    Neuropathy    Stroke [facility])     Past Surgical History:  Procedure Laterality Date   CATARACT EXTRACTION, BILATERAL  01/18/2017   CORONARY STENT INTERVENTION N/A 08/10/2023   Procedure: CORONARY STENT INTERVENTION;  Surgeon: Darron Deatrice LABOR, MD;  Location: [facility] INVASIVE CV LAB;  Service: Cardiovascular;  Laterality: N/A;   LEFT HEART CATH AND CORONARY ANGIOGRAPHY N/A 08/10/2023   Procedure: LEFT HEART CATH AND  CORONARY ANGIOGRAPHY;  Surgeon: Darron Deatrice LABOR, MD;  Location: [facility] INVASIVE CV LAB;  Service: Cardiovascular;  Laterality: N/A;   LITHOTRIPSY     MASTECTOMY Right 05/02/2018   PACEMAKER IMPLANT N/A 05/13/2023   Procedure: PACEMAKER IMPLANT;  Surgeon: Kennyth Chew, MD;  Location: [facility] INVASIVE CV LAB;  Service: Cardiovascular;  Laterality: N/A;   TEMPORARY PACEMAKER N/A 05/11/2023   Procedure: TEMPORARY PACEMAKER;  Surgeon: [place], Peter M, MD;  Location: [facility] INVASIVE CV LAB;  Service: Cardiovascular;  Laterality: N/A;   TONSILLECTOMY AND ADENOIDECTOMY       reports that he has never smoked. He has never used smokeless tobacco. He reports that he does not drink alcohol and does not use drugs.  Allergies  Allergen Reactions   Ramipril Anaphylaxis   Other Diarrhea    Severe intolerance to Chemotherapy in the past.   Sertraline Other (See Comments)    Extreme headaches   Adhesive [Tape] Rash    Family History  Problem Relation Age of Onset   Lymphoma Mother    Cancer Mother    Diabetes Father    Stroke Father    Ovarian cancer Sister    Cancer Sister    Cancer Paternal Grandmother     Prior to Admission medications   Medication Sig Start Date End Date Taking? Authorizing Provider  acetaminophen  (TYLENOL ) 650 MG CR tablet Take 1 tablet (650 mg total) by mouth every 8 (eight) hours as needed for pain.  05/14/23   [name], Xika, NP  Ascorbic Acid  (VITAMIN C) 1000 MG tablet Take 1,000 mg by mouth 3 (three) times a week.    [provider]  atorvastatin  (LIPITOR ) 80 MG tablet Take 1 tablet (80 mg total) by mouth at bedtime. 09/14/23   John Mabel Mt, DO  clopidogrel  (PLAVIX ) 75 MG tablet Take 1 tablet (75 mg total) by mouth daily with breakfast. 10/11/23   John Mabel Mt, DO  clotrimazole -betamethasone  (LOTRISONE ) cream Apply 1 Application topically daily. 07/11/23   John Mabel Mt, DO  cyanocobalamin  1000 MCG tablet Take 1 tablet (1,000 mcg total) by mouth  daily. 09/14/23   John Mabel Mt, DO  escitalopram  (LEXAPRO ) 10 MG tablet Take 1 tablet (10 mg total) by mouth daily. 10/03/23   John Mabel Mt, DO  ferrous sulfate  (FEROSUL) 325 (65 FE) MG tablet Take 1 tablet (325 mg total) by mouth daily with breakfast. 11/28/23   John, Mabel Mt, DO  finasteride  (PROSCAR ) 5 MG tablet Take 1 tablet (5 mg total) by mouth daily. *Need appointment for future refills.* 11/28/23   John Mabel Mt, DO  fluconazole  (DIFLUCAN ) 100 MG tablet Take 1 tablet (100 mg total) by mouth daily. 11/10/23     hyoscyamine  (LEVSIN  SL) 0.125 MG SL tablet Place 1 tablet (0.125 mg total) under the tongue every 4 (four) hours as needed for bladder spasms. 08/17/23   Angiulli, Toribio PARAS, PA-C  insulin  glargine (LANTUS ) 100 UNIT/ML injection Inject 0.15 mLs (15 Units total) into the skin daily for 7 days. 11/11/23 11/18/23  John Mabel Mt, DO  loperamide  (IMODIUM ) 2 MG capsule Take 1 capsule (2 mg total) by mouth as needed for diarrhea or loose stools. 08/18/23   [name], John RAMAN, PA-C  metFORMIN  (GLUCOPHAGE ) 500 MG tablet Take 1 tablet (500 mg total) by mouth 2 (two) times daily with a meal. 09/14/23   Wendling, Mabel Mt, DO  midodrine  (PROAMATINE ) 10 MG tablet Take 1 tablet (10 mg total) by mouth 3 (three) times daily with meals. *Need appointment for future refills.* 09/30/23   [name], Hao, PA  Multiple Vitamin (MULTIVITAMIN) tablet Take 1 tablet by mouth 2 (two) times a week.    [provider]  pantoprazole  (PROTONIX ) 40 MG tablet Take 1 tablet (40 mg total) by mouth at bedtime. 11/14/23   John Mabel Mt, DO  amLODipine  (NORVASC ) 2.5 MG tablet Take 1 tablet (2.5 mg total) by mouth daily. 06/15/23 06/30/23  John Mabel Mt, DO  apixaban  (ELIQUIS ) 5 MG TABS tablet Take 1 tablet (5 mg total) by mouth 2 (two) times daily. 10/11/23 10/11/23  John Mabel Mt, DO    Physical Exam: Constitutional: Moderately built and nourished. Vitals:    12/04/23 1908 12/04/23 2015 12/04/23 2130 12/04/23 2251  BP:   (!) 186/71   Pulse: 71 74 80   Resp:  (!) 22 (!) 22   Temp: (!) 101 F (38.3 C)   99.2 F (37.3 C)  TempSrc: Oral   Oral  SpO2: 100% 97% 97%   Weight:      Height:       Eyes: Anicteric no pallor. ENMT: No discharge from the ears eyes nose or mouth. Neck: No mass felt.  No neck rigidity. Respiratory: No rhonchi or crepitations. Cardiovascular: S1-S2 heard. Abdomen: Soft nontender bowel sound present. Musculoskeletal: No edema. Skin: No rash. Neurologic: Alert awake oriented to time place and person moving all extremities. Psychiatric: Appears normal.  Normal affect.   Labs on Admission: I have personally reviewed  following labs and imaging studies  CBC: Recent Labs  Lab 12/04/23 1851  WBC 11.1*  NEUTROABS 8.6*  HGB 12.1*  HCT 37.6*  MCV 86.8  PLT 247   Basic Metabolic Panel: Recent Labs  Lab 12/04/23 1851  NA 138  K 3.7  CL 101  CO2 22  GLUCOSE 265*  BUN 13  CREATININE 1.07  CALCIUM  9.3  MG 1.0*   GFR: Estimated Creatinine Clearance: 63.5 mL/min (by C-G formula based on SCr of 1.07 mg/dL). Liver Function Tests: Recent Labs  Lab 12/04/23 1851  AST 26  ALT 22  ALKPHOS 56  BILITOT 1.3*  PROT 6.2*  ALBUMIN 3.4*   Recent Labs  Lab 12/04/23 1851  LIPASE 31   Recent Labs  Lab 12/04/23 1851  AMMONIA 15   Coagulation Profile: No results for input(s): INR, PROTIME in the last 168 hours. Cardiac Enzymes: No results for input(s): CKTOTAL, CKMB, CKMBINDEX, TROPONINI in the last 168 hours. BNP (last 3 results) No results for input(s): PROBNP in the last 8760 hours. HbA1C: No results for input(s): HGBA1C in the last 72 hours. CBG: Recent Labs  Lab 12/04/23 1932  GLUCAP 253*   Lipid Profile: No results for input(s): CHOL, HDL, LDLCALC, TRIG, CHOLHDL, LDLDIRECT in the last 72 hours. Thyroid  Function Tests: No results for input(s): TSH, T4TOTAL,  FREET4, T3FREE, THYROIDAB in the last 72 hours. Anemia Panel: No results for input(s): VITAMINB12, FOLATE, FERRITIN, TIBC, IRON, RETICCTPCT in the last 72 hours. Urine analysis:    Component Value Date/Time   COLORURINE STRAW (A) 12/04/2023 1851   APPEARANCEUR HAZY (A) 12/04/2023 1851   LABSPEC 1.010 12/04/2023 1851   PHURINE 5.0 12/04/2023 1851   GLUCOSEU >=500 (A) 12/04/2023 1851   HGBUR NEGATIVE 12/04/2023 1851   BILIRUBINUR NEGATIVE 12/04/2023 1851   KETONESUR 5 (A) 12/04/2023 1851   PROTEINUR NEGATIVE 12/04/2023 1851   NITRITE NEGATIVE 12/04/2023 1851   LEUKOCYTESUR MODERATE (A) 12/04/2023 1851   Sepsis Labs: @LABRCNTIP (procalcitonin:4,lacticidven:4) ) Recent Results (from the past 240 hours)  Resp panel by RT-PCR (RSV, Flu A&B, Covid) Anterior Nasal Swab     Status: None   Collection Time: 12/04/23  8:40 PM   Specimen: Anterior Nasal Swab  Result Value Ref Range Status   SARS Coronavirus 2 by RT PCR NEGATIVE NEGATIVE Final   Influenza A by PCR NEGATIVE NEGATIVE Final   Influenza B by PCR NEGATIVE NEGATIVE Final    Comment: (NOTE) The Xpert Xpress SARS-CoV-2/FLU/RSV plus assay is intended as an aid in the diagnosis of influenza from Nasopharyngeal swab specimens and should not be used as a sole basis for treatment. Nasal washings and aspirates are unacceptable for Xpert Xpress SARS-CoV-2/FLU/RSV testing.  Fact Sheet for Patients: BloggerCourse.com  Fact Sheet for Healthcare Providers: SeriousBroker.it  This test is not yet approved or cleared by the [place]  FDA and has been authorized for detection and/or diagnosis of SARS-CoV-2 by FDA under an Emergency Use Authorization (EUA). This EUA will remain in effect (meaning this test can be used) for the duration of the COVID-19 declaration under Section 564(b)(1) of the Act, 21 U.S.C. section 360bbb-3(b)(1), unless the authorization is terminated  or revoked.     Resp Syncytial Virus by PCR NEGATIVE NEGATIVE Final    Comment: (NOTE) Fact Sheet for Patients: BloggerCourse.com  Fact Sheet for Healthcare Providers: SeriousBroker.it  This test is not yet approved or cleared by the [place]  FDA and has been authorized for detection and/or diagnosis of SARS-CoV-2 by FDA under an Emergency  Use Authorization (EUA). This EUA will remain in effect (meaning this test can be used) for the duration of the COVID-19 declaration under Section 564(b)(1) of the Act, 21 U.S.C. section 360bbb-3(b)(1), unless the authorization is terminated or revoked.  Performed at [facility], [address], [place], [place] [address]      Radiological Exams on Admission: CT Angio Chest PE W and/or Wo Contrast Result Date: 12/04/2023 CLINICAL DATA:  Nausea and vomiting with chest and abdominal pain, initial encounter EXAM: CT ANGIOGRAPHY CHEST CT ABDOMEN AND PELVIS WITH CONTRAST TECHNIQUE: Multidetector CT imaging of the chest was performed using the standard protocol during bolus administration of intravenous contrast. Multiplanar CT image reconstructions and MIPs were obtained to evaluate the vascular anatomy. Multidetector CT imaging of the abdomen and pelvis was performed using the standard protocol during bolus administration of intravenous contrast. RADIATION DOSE REDUCTION: This exam was performed according to the departmental dose-optimization program which includes automated exposure control, adjustment of the mA and/or kV according to patient size and/or use of iterative reconstruction technique. CONTRAST:  75mL OMNIPAQUE  IOHEXOL  350 MG/ML SOLN COMPARISON:  None Available. FINDINGS: CTA CHEST FINDINGS Cardiovascular: Thoracic aorta shows atherosclerotic calcifications without aneurysmal dilatation or dissection. No cardiac enlargement is seen. Coronary calcifications are noted. The pulmonary  artery shows a normal branching pattern bilaterally. No filling defect to suggest pulmonary embolism is noted. Mediastinum/Nodes: Thoracic inlet is within normal limits. No hilar or mediastinal adenopathy is noted. The esophagus as visualized is within normal limits. Lungs/Pleura: Lungs are well aerated bilaterally. No focal infiltrate or sizable effusion is seen. Persistent pleural based density is noted in the anterolateral aspect of the right upper lobe best seen on image number 34 of series 9 stable from the prior exam and likely related to prior radiation therapy. Some scarring is noted in the right lung base. No focal nodule is seen. Musculoskeletal: Degenerative changes of the thoracic spine are noted. Review of the MIP images confirms the above findings. CT ABDOMEN and PELVIS FINDINGS Hepatobiliary: Dependent gallstones are noted. No wall thickening or pericholecystic fluid is noted. The liver is within normal limits. Pancreas: Unremarkable. No pancreatic ductal dilatation or surrounding inflammatory changes. Spleen: Scattered calcified granulomas are noted. Adrenals/Urinary Tract: The adrenal glands again shows some nodular thickening although no discrete lesion is noted. The kidneys demonstrate a normal enhancement pattern bilaterally. No renal calculi or obstructive changes are seen. The bladder is decompressed by Foley catheter. Stomach/Bowel: Colon is predominately decompressed. No inflammatory changes are seen. The appendix is not well visualized and may have been surgically removed. No inflammatory changes to suggest appendicitis are noted. Stomach and small bowel are within normal limits. Vascular/Lymphatic: Aortic atherosclerosis. No enlarged abdominal or pelvic lymph nodes. Reproductive: Prostate is unremarkable. Other: No abdominal wall hernia or abnormality. No abdominopelvic ascites. Musculoskeletal: Mild degenerative changes of lumbar spine are noted. Review of the MIP images confirms the above  findings. IMPRESSION: CTA of the chest: No evidence of pulmonary embolism. Chronic scarring in the right lung. CT of the abdomen and pelvis: Cholelithiasis without complicating factors. Changes of prior granulomatous disease. No acute abnormality is seen. Electronically Signed   By: Oneil Devonshire M.D.   On: 12/04/2023 21:25   CT ABDOMEN PELVIS W CONTRAST Result Date: 12/04/2023 CLINICAL DATA:  Nausea and vomiting with chest and abdominal pain, initial encounter EXAM: CT ANGIOGRAPHY CHEST CT ABDOMEN AND PELVIS WITH CONTRAST TECHNIQUE: Multidetector CT imaging of the chest was performed using the standard protocol during bolus administration  of intravenous contrast. Multiplanar CT image reconstructions and MIPs were obtained to evaluate the vascular anatomy. Multidetector CT imaging of the abdomen and pelvis was performed using the standard protocol during bolus administration of intravenous contrast. RADIATION DOSE REDUCTION: This exam was performed according to the departmental dose-optimization program which includes automated exposure control, adjustment of the mA and/or kV according to patient size and/or use of iterative reconstruction technique. CONTRAST:  75mL OMNIPAQUE  IOHEXOL  350 MG/ML SOLN COMPARISON:  None Available. FINDINGS: CTA CHEST FINDINGS Cardiovascular: Thoracic aorta shows atherosclerotic calcifications without aneurysmal dilatation or dissection. No cardiac enlargement is seen. Coronary calcifications are noted. The pulmonary artery shows a normal branching pattern bilaterally. No filling defect to suggest pulmonary embolism is noted. Mediastinum/Nodes: Thoracic inlet is within normal limits. No hilar or mediastinal adenopathy is noted. The esophagus as visualized is within normal limits. Lungs/Pleura: Lungs are well aerated bilaterally. No focal infiltrate or sizable effusion is seen. Persistent pleural based density is noted in the anterolateral aspect of the right upper lobe best seen on  image number 34 of series 9 stable from the prior exam and likely related to prior radiation therapy. Some scarring is noted in the right lung base. No focal nodule is seen. Musculoskeletal: Degenerative changes of the thoracic spine are noted. Review of the MIP images confirms the above findings. CT ABDOMEN and PELVIS FINDINGS Hepatobiliary: Dependent gallstones are noted. No wall thickening or pericholecystic fluid is noted. The liver is within normal limits. Pancreas: Unremarkable. No pancreatic ductal dilatation or surrounding inflammatory changes. Spleen: Scattered calcified granulomas are noted. Adrenals/Urinary Tract: The adrenal glands again shows some nodular thickening although no discrete lesion is noted. The kidneys demonstrate a normal enhancement pattern bilaterally. No renal calculi or obstructive changes are seen. The bladder is decompressed by Foley catheter. Stomach/Bowel: Colon is predominately decompressed. No inflammatory changes are seen. The appendix is not well visualized and may have been surgically removed. No inflammatory changes to suggest appendicitis are noted. Stomach and small bowel are within normal limits. Vascular/Lymphatic: Aortic atherosclerosis. No enlarged abdominal or pelvic lymph nodes. Reproductive: Prostate is unremarkable. Other: No abdominal wall hernia or abnormality. No abdominopelvic ascites. Musculoskeletal: Mild degenerative changes of lumbar spine are noted. Review of the MIP images confirms the above findings. IMPRESSION: CTA of the chest: No evidence of pulmonary embolism. Chronic scarring in the right lung. CT of the abdomen and pelvis: Cholelithiasis without complicating factors. Changes of prior granulomatous disease. No acute abnormality is seen. Electronically Signed   By: Oneil Devonshire M.D.   On: 12/04/2023 21:25   CT Head Wo Contrast Result Date: 12/04/2023 CLINICAL DATA:  Delirium EXAM: CT HEAD WITHOUT CONTRAST TECHNIQUE: Contiguous axial images were  obtained from the base of the skull through the vertex without intravenous contrast. RADIATION DOSE REDUCTION: This exam was performed according to the departmental dose-optimization program which includes automated exposure control, adjustment of the mA and/or kV according to patient size and/or use of iterative reconstruction technique. COMPARISON:  CT head 10/26/2023 FINDINGS: Brain: Cerebral ventricle sizes are concordant with the degree of cerebral volume loss. Patchy and confluent areas of decreased attenuation are noted throughout the deep and periventricular white matter of the cerebral hemispheres bilaterally, compatible with chronic microvascular ischemic disease. No evidence of large-territorial acute infarction. No parenchymal hemorrhage. No mass lesion. No extra-axial collection. No mass effect or midline shift. No hydrocephalus. Basilar cisterns are patent. Vascular: No hyperdense vessel. Atherosclerotic calcifications are present within the cavernous internal carotid arteries. Skull: No acute fracture  or focal lesion. Sinuses/Orbits: Paranasal sinuses and mastoid air cells are clear. The orbits are unremarkable. Other: None. IMPRESSION: No acute intracranial abnormality. Electronically Signed   By: Morgane  Naveau M.D.   On: 12/04/2023 21:21   DG Chest Port 1 View Result Date: 12/04/2023 CLINICAL DATA:  Vomiting. 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 The heart size and mediastinal contours are within normal limits. Mild calcification of the aortic arch is noted. Both lungs are clear. The visualized skeletal structures are unremarkable. IMPRESSION: No active disease. Electronically Signed   By: Suzen Dials M.D.   On: 12/04/2023 20:05    EKG: Independently reviewed.  Normal sinus rhythm.  Assessment/Plan Principal Problem:   Sepsis (HCC) Active Problems:   BPH (benign prostatic hyperplasia)   Depression, major,  single episode, complete remission (HCC)   History of breast cancer   GERD (gastroesophageal reflux disease)   Type 2 diabetes mellitus with hyperglycemia, with long-term current use of insulin  (HCC)   Cognitive impairment   HFrEF (heart failure with reduced ejection fraction) (HCC)   CAD S/P percutaneous coronary angioplasty    Sepsis likely source could be UTI.  While encephalopathic tachycardic with elevated lactic acid consistent with sepsis physiology.  Patient also has diarrhea stool studies are pending.  On empiric antibiotics.  Follow cultures.  Patient did receive fluid bolus in the ER given the cardiomyopathy further fluids were held.  Patient refusing Foley catheter be changed. CAD status post DES denies any chest pain.  Takes antiplatelet agents and statins. History of cardiomyopathy with a EF of 30 to 35% not on GDMT due to hypotensive episodes. History of CVA on statins and Plavix . History of BPH with urinary retention with chronic indwelling Foley catheter last changed about 3 weeks ago patient is strongly declining to change the Foley catheter. Diabetes mellitus type 2 recently primary care increased Lantus  insulin  to 15 units.  Last hemoglobin A1c was 7.5.  On sliding scale coverage. Chronic anemia on iron supplements. Depression on Lexapro . GERD on PPI. History of breast cancer status post mastectomy and chemotherapy being followed by oncologist at [facility]. 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 Prior history of provoked PE has completed 6 months of Eliquis .  Since patient has sepsis will need close monitoring further workup and more than 2 midnight stay.   DVT prophylaxis: Lovenox . Code Status: DNR. Family Communication: Unable to reach patient's wife. Disposition Plan: Monitored bed. Consults called: None. Admission status: Inpatient.

## 2023-12-04 NOTE — ED Provider Notes (Signed)
 [place] EMERGENCY DEPARTMENT AT [facility] Provider Note   CSN: [id number] Arrival date & time: 12/04/23  8187     Patient presents with: Emesis and Altered Mental Status   John [name] is a 78 y.o. male here for evaluation of emesis, altered mental status.  EMS called out for intermittent altered mental status over the last 24 hours.  On arrival to the ED is currently ANO x 4.  Was given Zofran  by EMS with persistent emesis.  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  Patient states he is feeling unwell.  Denies any chest pain, shortness of breath, abdominal pain.  States he has had multiple episodes of NBNB emesis.  He is anticoagulated, however not sure why.  He has a known chronic indwelling Foley catheter for BPH, recently treated for UTI about a month ago.  Patient has a history of cancer.  Blood sugars have been elevated at home, family typically takes once daily.  He is on IV iron.  Has had some generalized weakness, sleeping more than normal.   History from wife John [name] at [phone]>>> patient's been lethargic over the last few days.  Woke up today seemed intermittently confused, not answering questions appropriately.  He has had loose stool over the last few days.  No C. difficile history however according to wife he has had UTIs x 2 over the last 6 weeks due to chronic indwelling Foley catheter treated with antibiotics.  Today he did not know what the remote control was and came out of the bathroom with his pull-up around his legs and he had stooled all over himself which is not typical.  Has had very little to drink or eat over the last few days.  Wife thinks Foley catheter was last changed out about 3 weeks ago.  No recent changes in medications.  Has not wanted to take his medicines over the last few days.  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  No obvious melanotic stool or BRBPR per wife   HPI     Prior to Admission  medications   Medication Sig Start Date End Date Taking? Authorizing Provider  acetaminophen  (TYLENOL ) 650 MG CR tablet Take 1 tablet (650 mg total) by mouth every 8 (eight) hours as needed for pain. 05/14/23   Zhao, Xika, NP  Ascorbic Acid  (VITAMIN C) 1000 MG tablet Take 1,000 mg by mouth 3 (three) times a week.    [provider]  atorvastatin  (LIPITOR ) 80 MG tablet Take 1 tablet (80 mg total) by mouth at bedtime. 09/14/23   [name], DO  clopidogrel  (PLAVIX ) 75 MG tablet Take 1 tablet (75 mg total) by mouth daily with breakfast. 10/11/23   [name], DO  clotrimazole -betamethasone  (LOTRISONE ) cream Apply 1 Application topically daily. 07/11/23   [name], DO  cyanocobalamin  1000 MCG tablet Take 1 tablet (1,000 mcg total) by mouth daily. 09/14/23   [name], DO  escitalopram  (LEXAPRO ) 10 MG tablet Take 1 tablet (10 mg total) by mouth daily. 10/03/23   [name], DO  ferrous sulfate  (FEROSUL) 325 (65 FE) MG tablet Take 1 tablet (325 mg total) by mouth daily with breakfast. 11/28/23   [name], DO  finasteride  (PROSCAR ) 5 MG tablet Take 1 tablet (5 mg total) by mouth daily. *Need appointment for future refills.* 11/28/23   [name], DO  fluconazole  (DIFLUCAN ) 100 MG tablet Take 1 tablet (  100 mg total) by mouth daily. 11/10/23     hyoscyamine  (LEVSIN  SL) 0.125 MG SL tablet Place 1 tablet (0.125 mg total) under the tongue every 4 (four) hours as needed for bladder spasms. 08/17/23   Angiulli, Toribio PARAS, PA-C  insulin  glargine (LANTUS ) 100 UNIT/ML injection Inject 0.15 mLs (15 Units total) into the skin daily for 7 days. 11/11/23 11/18/23  [name], DO  loperamide  (IMODIUM ) 2 MG capsule Take 1 capsule (2 mg total) by mouth as needed for diarrhea or loose stools. 08/18/23   Love, Sharlet RAMAN, PA-C  metFORMIN  (GLUCOPHAGE ) 500 MG tablet Take 1 tablet (500 mg total) by mouth 2 (two) times daily with a meal.  09/14/23   Wendling, Mabel Mt, DO  midodrine  (PROAMATINE ) 10 MG tablet Take 1 tablet (10 mg total) by mouth 3 (three) times daily with meals. *Need appointment for future refills.* 09/30/23   Meng, Hao, PA  Multiple Vitamin (MULTIVITAMIN) tablet Take 1 tablet by mouth 2 (two) times a week.    [provider]  pantoprazole  (PROTONIX ) 40 MG tablet Take 1 tablet (40 mg total) by mouth at bedtime. 11/14/23   [name], DO  amLODipine  (NORVASC ) 2.5 MG tablet Take 1 tablet (2.5 mg total) by mouth daily. 06/15/23 06/30/23  [name], DO  apixaban  (ELIQUIS ) 5 MG TABS tablet Take 1 tablet (5 mg total) by mouth 2 (two) times daily. 10/11/23 10/11/23  [name], DO    Allergies: Ramipril, Other, Sertraline, and Adhesive [tape]    Review of Systems  Unable to perform ROS: Mental status change  Constitutional:  Positive for activity change, appetite change, chills and fatigue.  HENT: Negative.    Respiratory: Negative.    Gastrointestinal:  Positive for diarrhea, nausea and vomiting. Negative for rectal pain.  Musculoskeletal: Negative.   Skin: Negative.   Neurological:  Positive for weakness.  All other systems reviewed and are negative.   Updated Vital Signs BP (!) 186/71   Pulse 80   Temp 99.2 F (37.3 C) (Oral)   Resp (!) 22   Ht 6' (1.829 m)   Wt 87 kg   SpO2 97%   BMI 26.01 kg/m   Physical Exam Vitals and nursing note reviewed.  Constitutional:      General: He is not in acute distress.    Appearance: He is well-developed. He is ill-appearing and toxic-appearing. He is not diaphoretic.     Comments: Actively vomiting  HENT:     Head: Normocephalic and atraumatic.     Nose: No congestion or rhinorrhea.     Mouth/Throat:     Mouth: Mucous membranes are dry.  Eyes:     Pupils: Pupils are equal, round, and reactive to light.  Cardiovascular:     Rate and Rhythm: Normal rate and regular rhythm.     Pulses: Normal pulses.           Radial pulses are 2+ on the right side and 2+ on the left side.       Dorsalis pedis pulses are 2+ on the right side and 2+ on the left side.     Heart sounds: Normal heart sounds.  Pulmonary:     Effort: Pulmonary effort is normal. No respiratory distress.     Breath sounds: Normal breath sounds.  Abdominal:     General: Bowel sounds are normal. There is no distension.     Palpations: Abdomen is soft.     Tenderness: There is no abdominal  tenderness. There is no right CVA tenderness, left CVA tenderness or guarding.  Genitourinary:    Comments: No stool in rectal vault  Musculoskeletal:        General: Normal range of motion.     Cervical back: Normal range of motion and neck supple.     Comments: Spontaneously moves extremities  Skin:    General: Skin is warm and dry.  Neurological:     Mental Status: He is alert. He is disoriented.     Motor: Weakness present.     Comments: Cranial nerves II through XII grossly intact Lethargic, alert to person, location-hospital, year 26 however not president Moves extremities however slow with movement, no focal obvious unilateral deficits.     (all labs ordered are listed, but only abnormal results are displayed) Labs Reviewed  CBC WITH DIFFERENTIAL/PLATELET - Abnormal; Notable for the following components:      Result Value   WBC 11.1 (*)    Hemoglobin 12.1 (*)    HCT 37.6 (*)    RDW 18.6 (*)    Neutro Abs 8.6 (*)    All other components within normal limits  COMPREHENSIVE METABOLIC PANEL WITH GFR - Abnormal; Notable for the following components:   Glucose, Bld 265 (*)    Total Protein 6.2 (*)    Albumin 3.4 (*)    Total Bilirubin 1.3 (*)    All other components within normal limits  OSMOLALITY - Abnormal; Notable for the following components:   Osmolality 301 (*)    All other components within normal limits  BETA-HYDROXYBUTYRIC ACID - Abnormal; Notable for the following components:   Beta-Hydroxybutyric Acid 0.52 (*)    All  other components within normal limits  MAGNESIUM  - Abnormal; Notable for the following components:   Magnesium  1.0 (*)    All other components within normal limits  URINALYSIS, W/ REFLEX TO CULTURE (INFECTION SUSPECTED) - Abnormal; Notable for the following components:   Color, Urine STRAW (*)    APPearance HAZY (*)    Glucose, UA >=500 (*)    Ketones, ur 5 (*)    Leukocytes,Ua MODERATE (*)    Bacteria, UA FEW (*)    All other components within normal limits  LACTIC ACID, PLASMA - Abnormal; Notable for the following components:   Lactic Acid, Venous 4.0 (*)    All other components within normal limits  CBG MONITORING, ED - Abnormal; Notable for the following components:   Glucose-Capillary 253 (*)    All other components within normal limits  I-STAT CG4 LACTIC ACID, ED - Abnormal; Notable for the following components:   Lactic Acid, Venous 3.8 (*)    All other components within normal limits  RESP PANEL BY RT-PCR (RSV, FLU A&B, COVID)  RVPGX2  CULTURE, BLOOD (ROUTINE X 2)  CULTURE, BLOOD (ROUTINE X 2)  URINE CULTURE  C DIFFICILE QUICK SCREEN W PCR REFLEX    GASTROINTESTINAL PANEL BY PCR, STOOL (REPLACES STOOL CULTURE)  LIPASE, BLOOD  AMMONIA  LACTIC ACID, PLASMA  OCCULT BLOOD GASTRIC / DUODENUM (SPECIMEN CUP)  POC OCCULT BLOOD, ED    EKG: None  Radiology: CT Angio Chest PE W and/or Wo Contrast Result Date: 12/04/2023 CLINICAL DATA:  Nausea and vomiting with chest and abdominal pain, initial encounter EXAM: CT ANGIOGRAPHY CHEST CT ABDOMEN AND PELVIS WITH CONTRAST TECHNIQUE: Multidetector CT imaging of the chest was performed using the standard protocol during bolus administration of intravenous contrast. Multiplanar CT image reconstructions and MIPs were obtained to evaluate the vascular  anatomy. Multidetector CT imaging of the abdomen and pelvis was performed using the standard protocol during bolus administration of intravenous contrast. RADIATION DOSE REDUCTION: This exam was  performed according to the departmental dose-optimization program which includes automated exposure control, adjustment of the mA and/or kV according to patient size and/or use of iterative reconstruction technique. CONTRAST:  75mL OMNIPAQUE  IOHEXOL  350 MG/ML SOLN COMPARISON:  None Available. FINDINGS: CTA CHEST FINDINGS Cardiovascular: Thoracic aorta shows atherosclerotic calcifications without aneurysmal dilatation or dissection. No cardiac enlargement is seen. Coronary calcifications are noted. The pulmonary artery shows a normal branching pattern bilaterally. No filling defect to suggest pulmonary embolism is noted. Mediastinum/Nodes: Thoracic inlet is within normal limits. No hilar or mediastinal adenopathy is noted. The esophagus as visualized is within normal limits. Lungs/Pleura: Lungs are well aerated bilaterally. No focal infiltrate or sizable effusion is seen. Persistent pleural based density is noted in the anterolateral aspect of the right upper lobe best seen on image number 34 of series 9 stable from the prior exam and likely related to prior radiation therapy. Some scarring is noted in the right lung base. No focal nodule is seen. Musculoskeletal: Degenerative changes of the thoracic spine are noted. Review of the MIP images confirms the above findings. CT ABDOMEN and PELVIS FINDINGS Hepatobiliary: Dependent gallstones are noted. No wall thickening or pericholecystic fluid is noted. The liver is within normal limits. Pancreas: Unremarkable. No pancreatic ductal dilatation or surrounding inflammatory changes. Spleen: Scattered calcified granulomas are noted. Adrenals/Urinary Tract: The adrenal glands again shows some nodular thickening although no discrete lesion is noted. The kidneys demonstrate a normal enhancement pattern bilaterally. No renal calculi or obstructive changes are seen. The bladder is decompressed by Foley catheter. Stomach/Bowel: Colon is predominately decompressed. No inflammatory  changes are seen. The appendix is not well visualized and may have been surgically removed. No inflammatory changes to suggest appendicitis are noted. Stomach and small bowel are within normal limits. Vascular/Lymphatic: Aortic atherosclerosis. No enlarged abdominal or pelvic lymph nodes. Reproductive: Prostate is unremarkable. Other: No abdominal wall hernia or abnormality. No abdominopelvic ascites. Musculoskeletal: Mild degenerative changes of lumbar spine are noted. Review of the MIP images confirms the above findings. IMPRESSION: CTA of the chest: No evidence of pulmonary embolism. Chronic scarring in the right lung. CT of the abdomen and pelvis: Cholelithiasis without complicating factors. Changes of prior granulomatous disease. No acute abnormality is seen. Electronically Signed   By: Oneil Devonshire M.D.   On: 12/04/2023 21:25   CT ABDOMEN PELVIS W CONTRAST Result Date: 12/04/2023 CLINICAL DATA:  Nausea and vomiting with chest and abdominal pain, initial encounter EXAM: CT ANGIOGRAPHY CHEST CT ABDOMEN AND PELVIS WITH CONTRAST TECHNIQUE: Multidetector CT imaging of the chest was performed using the standard protocol during bolus administration of intravenous contrast. Multiplanar CT image reconstructions and MIPs were obtained to evaluate the vascular anatomy. Multidetector CT imaging of the abdomen and pelvis was performed using the standard protocol during bolus administration of intravenous contrast. RADIATION DOSE REDUCTION: This exam was performed according to the departmental dose-optimization program which includes automated exposure control, adjustment of the mA and/or kV according to patient size and/or use of iterative reconstruction technique. CONTRAST:  75mL OMNIPAQUE  IOHEXOL  350 MG/ML SOLN COMPARISON:  None Available. FINDINGS: CTA CHEST FINDINGS Cardiovascular: Thoracic aorta shows atherosclerotic calcifications without aneurysmal dilatation or dissection. No cardiac enlargement is seen.  Coronary calcifications are noted. The pulmonary artery shows a normal branching pattern bilaterally. No filling defect to suggest pulmonary embolism is noted. Mediastinum/Nodes: Thoracic  inlet is within normal limits. No hilar or mediastinal adenopathy is noted. The esophagus as visualized is within normal limits. Lungs/Pleura: Lungs are well aerated bilaterally. No focal infiltrate or sizable effusion is seen. Persistent pleural based density is noted in the anterolateral aspect of the right upper lobe best seen on image number 34 of series 9 stable from the prior exam and likely related to prior radiation therapy. Some scarring is noted in the right lung base. No focal nodule is seen. Musculoskeletal: Degenerative changes of the thoracic spine are noted. Review of the MIP images confirms the above findings. CT ABDOMEN and PELVIS FINDINGS Hepatobiliary: Dependent gallstones are noted. No wall thickening or pericholecystic fluid is noted. The liver is within normal limits. Pancreas: Unremarkable. No pancreatic ductal dilatation or surrounding inflammatory changes. Spleen: Scattered calcified granulomas are noted. Adrenals/Urinary Tract: The adrenal glands again shows some nodular thickening although no discrete lesion is noted. The kidneys demonstrate a normal enhancement pattern bilaterally. No renal calculi or obstructive changes are seen. The bladder is decompressed by Foley catheter. Stomach/Bowel: Colon is predominately decompressed. No inflammatory changes are seen. The appendix is not well visualized and may have been surgically removed. No inflammatory changes to suggest appendicitis are noted. Stomach and small bowel are within normal limits. Vascular/Lymphatic: Aortic atherosclerosis. No enlarged abdominal or pelvic lymph nodes. Reproductive: Prostate is unremarkable. Other: No abdominal wall hernia or abnormality. No abdominopelvic ascites. Musculoskeletal: Mild degenerative changes of lumbar spine are  noted. Review of the MIP images confirms the above findings. IMPRESSION: CTA of the chest: No evidence of pulmonary embolism. Chronic scarring in the right lung. CT of the abdomen and pelvis: Cholelithiasis without complicating factors. Changes of prior granulomatous disease. No acute abnormality is seen. Electronically Signed   By: Oneil Devonshire M.D.   On: 12/04/2023 21:25   CT Head Wo Contrast Result Date: 12/04/2023 CLINICAL DATA:  Delirium EXAM: CT HEAD WITHOUT CONTRAST TECHNIQUE: Contiguous axial images were obtained from the base of the skull through the vertex without intravenous contrast. RADIATION DOSE REDUCTION: This exam was performed according to the departmental dose-optimization program which includes automated exposure control, adjustment of the mA and/or kV according to patient size and/or use of iterative reconstruction technique. COMPARISON:  CT head 10/26/2023 FINDINGS: Brain: Cerebral ventricle sizes are concordant with the degree of cerebral volume loss. Patchy and confluent areas of decreased attenuation are noted throughout the deep and periventricular white matter of the cerebral hemispheres bilaterally, compatible with chronic microvascular ischemic disease. No evidence of large-territorial acute infarction. No parenchymal hemorrhage. No mass lesion. No extra-axial collection. No mass effect or midline shift. No hydrocephalus. Basilar cisterns are patent. Vascular: No hyperdense vessel. Atherosclerotic calcifications are present within the cavernous internal carotid arteries. Skull: No acute fracture or focal lesion. Sinuses/Orbits: Paranasal sinuses and mastoid air cells are clear. The orbits are unremarkable. Other: None. IMPRESSION: No acute intracranial abnormality. Electronically Signed   By: Morgane  Naveau M.D.   On: 12/04/2023 21:21   DG Chest Port 1 View Result Date: 12/04/2023 CLINICAL DATA:  Vomiting. EXAM: PORTABLE CHEST 1 VIEW COMPARISON:  October 26, 2023 FINDINGS: There is  stable dual lead AICD positioning. The heart size and mediastinal contours are within normal limits. Mild calcification of the aortic arch is noted. Both lungs are clear. The visualized skeletal structures are unremarkable. IMPRESSION: No active disease. Electronically Signed   By: Suzen Dials M.D.   On: 12/04/2023 20:05     .Critical Care  Performed by: Edie Einstein  A, PA-C Authorized by: Edie Rosebud LABOR, PA-C   Critical care provider statement:    Critical care time (minutes):  35   Critical care was necessary to treat or prevent imminent or life-threatening deterioration of the following conditions:  Sepsis   Critical care was time spent personally by me on the following activities:  Development of treatment plan with patient or surrogate, discussions with consultants, evaluation of patient's response to treatment, examination of patient, ordering and review of laboratory studies, ordering and review of radiographic studies, ordering and performing treatments and interventions, pulse oximetry, re-evaluation of patient's condition and review of old charts    Medications Ordered in the ED  promethazine  (PHENERGAN ) 12.5 mg in sodium chloride  0.9 % 50 mL IVPB (0 mg Intravenous Stopped 12/04/23 2136)  metroNIDAZOLE  (FLAGYL ) IVPB 500 mg (500 mg Intravenous New Bag/Given 12/04/23 2243)  vancomycin  (VANCOREADY) IVPB 1750 mg/350 mL (has no administration in time range)  ondansetron  (ZOFRAN ) injection 4 mg (4 mg Intravenous Given 12/04/23 1901)  sodium chloride  0.9 % bolus 250 mL (0 mLs Intravenous Stopped 12/04/23 2031)  acetaminophen  (TYLENOL ) suppository 650 mg (650 mg Rectal Given 12/04/23 1954)  magnesium  sulfate IVPB 2 g 50 mL (0 g Intravenous Stopped 12/04/23 2242)  ceFEPIme  (MAXIPIME ) 2 g in sodium chloride  0.9 % 100 mL IVPB (0 g Intravenous Stopped 12/04/23 2146)  iohexol  (OMNIPAQUE ) 350 MG/ML injection 75 mL (75 mLs Intravenous Contrast Given 12/04/23 2110)  sodium chloride  0.9 %  bolus 500 mL (0 mLs Intravenous Stopped 12/04/23 2247)  pantoprazole  (PROTONIX ) injection 40 mg (40 mg Intravenous Given 12/04/23 2200)    Clinical Course as of 12/04/23 2259  Sun Dec 04, 2023  1853 EF 30 % on recent ECHO  [BH]  2038 Patient refused replacement of foley cath [BH]  2235 Dr. Memory see for admission [BH]    Clinical Course User Index [BH] Temeka Pore A, PA-C   78 year old here for evaluation of altered mental status, weakness.  Arrives with EMS he is febrile, tachypneic.  Actively vomiting.  Appears clinically dehydrated.  He can answer my questions however confused about the president.  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  I had recommended changing out his catheter to a clean line however patient declined.  Does appear globally weak however no unilateral deficits.  Patient started on sepsis protocol given fever.  Given broad-spectrum antibiotics.  Wife does state he has had diarrhea over the last few days will add on C. difficile given recent antibiotics for UTI as well as GI pathogen panel  Labs and imaging personally viewed interpreted CBC leukocytosis 11.1 Metabolic panel glucose 265, T. bili 1.3 Magnesium  1.0, will supplement Lipase 31 UA appears infected however from indwelling Foley catheter given patient refused changing out Lactic 4.0--3.8>>> reviewed recent EF on echo at 30 will give additional small fluid bolus COVID, flu, RSV negative Osmolality 301 Ammonia 15 Beta hydroxy 0.52 Occult negative-no stool in rectal vault>>> not crossing over into computer Chest x-ray without significant abnormality  Patient reassessed.  Continues to vomit despite Zofran  with EMS, here.  Will give Phenergan .  Patient reassessed.  Feels improved after the Phenergan .  Pending CT scan  CT head, chest, abdomen pelvis without acute abnormality  Will admit to medicine for sepsis, altered mental status  Discussed with Dr.  Franky who is agreeable to eval patient for admission  The patient appears reasonably stabilized for admission considering the current resources, flow, and capabilities available in the ED at this  time, and I doubt any other [facility] requiring further screening and/or treatment in the ED prior to admission.                                  Medical Decision Making Amount and/or Complexity of Data Reviewed Independent Historian: EMS External Data Reviewed: labs, radiology, ECG and notes. Labs: ordered. Decision-making details documented in ED Course. Radiology: ordered and independent interpretation performed. Decision-making details documented in ED Course. ECG/medicine tests: ordered and independent interpretation performed. Decision-making details documented in ED Course.  Risk OTC drugs. Prescription drug management. Parenteral controlled substances. Decision regarding hospitalization. Diagnosis or treatment significantly limited by social determinants of health.        Final diagnoses:  Sepsis without acute organ dysfunction, due to unspecified organism (HCC)  Hyperglycemia  Hypomagnesemia  Altered mental status, unspecified altered mental status type  Nausea vomiting and diarrhea  Chronic indwelling Foley catheter    ED Discharge Orders     None          Katrisha Segall A, PA-C 12/04/23 2259    Tegeler, Lonni PARAS, MD 12/04/23 2321

## 2023-12-04 NOTE — ED Notes (Signed)
 Emesis x2 since arriving to ED.

## 2023-12-05 DIAGNOSIS — A419 Sepsis, unspecified organism: Secondary | ICD-10-CM | POA: Diagnosis not present

## 2023-12-05 DIAGNOSIS — N390 Urinary tract infection, site not specified: Secondary | ICD-10-CM | POA: Diagnosis not present

## 2023-12-05 LAB — BLOOD CULTURE ID PANEL (REFLEXED) - BCID2

## 2023-12-05 LAB — COMPREHENSIVE METABOLIC PANEL WITH GFR
ALT: 20 U/L (ref 0–44)
AST: 21 U/L (ref 15–41)
Albumin: 3 g/dL — ABNORMAL LOW (ref 3.5–5.0)
Alkaline Phosphatase: 51 U/L (ref 38–126)
Anion gap: 10 (ref 5–15)
BUN: 13 mg/dL (ref 8–23)
CO2: 22 mmol/L (ref 22–32)
Calcium: 8.6 mg/dL — ABNORMAL LOW (ref 8.9–10.3)
Chloride: 104 mmol/L (ref 98–111)
Creatinine, Ser: 0.95 mg/dL (ref 0.61–1.24)
GFR, Estimated: >60 mL/min (ref >60)
Glucose, Bld: 222 mg/dL — ABNORMAL HIGH (ref 70–99)
Potassium: 4 mmol/L (ref 3.5–5.1)
Sodium: 136 mmol/L (ref 135–145)
Total Bilirubin: 1.4 mg/dL — ABNORMAL HIGH (ref 0.0–1.2)
Total Protein: 5.6 g/dL — ABNORMAL LOW (ref 6.5–8.1)

## 2023-12-05 LAB — CBC
HCT: 34.1 % — ABNORMAL LOW (ref 39.0–52.0)
HCT: 37.2 % — ABNORMAL LOW (ref 39.0–52.0)
Hemoglobin: 11 g/dL — ABNORMAL LOW (ref 13.0–17.0)
Hemoglobin: 11.7 g/dL — ABNORMAL LOW (ref 13.0–17.0)
MCH: 28 pg (ref 26.0–34.0)
MCH: 27.8 pg (ref 26.0–34.0)
MCHC: 32.3 g/dL (ref 30.0–36.0)
MCHC: 31.5 g/dL (ref 30.0–36.0)
MCV: 86.8 fL (ref 80.0–100.0)
MCV: 88.4 fL (ref 80.0–100.0)
Platelets: 217 K/uL (ref 150–400)
Platelets: 209 K/uL (ref 150–400)
RBC: 3.93 MIL/uL — ABNORMAL LOW (ref 4.22–5.81)
RBC: 4.21 MIL/uL — ABNORMAL LOW (ref 4.22–5.81)
RDW: 18.7 % — ABNORMAL HIGH (ref 11.5–15.5)
RDW: 18.7 % — ABNORMAL HIGH (ref 11.5–15.5)
WBC: 9.7 K/uL (ref 4.0–10.5)
WBC: 9.6 K/uL (ref 4.0–10.5)
nRBC: 0 % (ref 0.0–0.2)
nRBC: 0 % (ref 0.0–0.2)

## 2023-12-05 LAB — CREATININE, SERUM
Creatinine, Ser: 0.86 mg/dL (ref 0.61–1.24)
GFR, Estimated: >60 mL/min (ref >60)

## 2023-12-05 LAB — GLUCOSE, CAPILLARY
Glucose-Capillary: 139 mg/dL — ABNORMAL HIGH (ref 70–99)
Glucose-Capillary: 138 mg/dL — ABNORMAL HIGH (ref 70–99)
Glucose-Capillary: 220 mg/dL — ABNORMAL HIGH (ref 70–99)

## 2023-12-05 LAB — LACTIC ACID, PLASMA
Lactic Acid, Venous: 2 mmol/L (ref 0.5–1.9)
Lactic Acid, Venous: 1.6 mmol/L (ref 0.5–1.9)

## 2023-12-05 LAB — I-STAT CG4 LACTIC ACID, ED
Lactic Acid, Venous: 1.6 mmol/L (ref 0.5–1.9)
Lactic Acid, Venous: 1.6 mmol/L (ref 0.5–1.9)

## 2023-12-05 LAB — CBG MONITORING, ED: Glucose-Capillary: 153 mg/dL — ABNORMAL HIGH (ref 70–99)

## 2023-12-05 MED ORDER — SODIUM CHLORIDE 0.9 % IV SOLN
2.0000 g | INTRAVENOUS | Status: DC
  Administered 2023-12-05 – 2023-12-08 (×4): 2 g via INTRAVENOUS
  Filled 2023-12-05 (×4): qty 20

## 2023-12-05 MED ORDER — SODIUM CHLORIDE 0.9 % IV SOLN
2.0000 g | Freq: Three times a day (TID) | INTRAVENOUS | Status: DC
  Administered 2023-12-05: 2 g via INTRAVENOUS
  Filled 2023-12-05: qty 12.5

## 2023-12-05 MED ORDER — VANCOMYCIN HCL 1750 MG/350ML IV SOLN
1750.0000 mg | INTRAVENOUS | Status: DC
  Filled 2023-12-05: qty 350

## 2023-12-05 NOTE — Progress Notes (Signed)
 Progress Note   Patient: John [name] FMW:[id number] DOB: [birth year] DOA: 12/04/2023     1 DOS: the patient was seen and examined on 12/05/2023   Brief hospital course: From HPI John [name] is a 78 y.o. male with history of breast cancer status postmastectomy and chemotherapy, history of CAD status post DES, history of stroke in January 2025, history of PE completed course of Eliquis  6 months, diabetes mellitus type 2, chronic indwelling Foley catheter was brought to the ER after patient's wife noticed that patient has been in intermittent episodes of confusion over the last 24 hours.  Patient's wife also noted that he has been having some diarrhea recently taking some antibiotics.  I am unable to reach patient's wife at this time.     ED Course: In the ER patient had a temperature of 101 F lactic acid was 4 was given fluid bolus started on empiric antibiotics for sepsis UA is concerning for UTI likely source.  Since patient also has diarrhea stool studies have been ordered.  At the time of my exam patient is become more alert awake.  Lactic acid has improved.  Admitted for sepsis likely source could be UTI.  CT head was unremarkable.  CT chest abdomen pelvis did not show anything acute.    Assessment and Plan:  Sepsis likely UTI.   Acute metabolic encephalopathy-improved Presented with tachycardia with elevated lactic acid consistent with sepsis physiology.  Patient also has diarrhea stool studies are pending.  On empiric antibiotics.  Follow cultures.  Patient did receive fluid bolus in the ER given the cardiomyopathy further fluids were held.  Patient refusing Foley catheter be changed. Continue current antibiotics  CAD status post DES denies any chest pain.  Takes antiplatelet agents and statins.   History of cardiomyopathy with a EF of 30 to 35% not on GDMT due to hypotensive episodes.  History of CVA on statins and Plavix .  History of BPH with urinary retention with chronic  indwelling Foley catheter last changed about 3 weeks ago. Nursing staff instructed to change patient's Foley if he allows  Diabetes mellitus type 2 recently primary care increased Lantus  insulin  to 15 units.  Last hemoglobin A1c was 7.5.  On sliding scale coverage.  Chronic anemia on iron supplements.  Depression on Lexapro .  GERD on PPI.  History of breast cancer status post mastectomy and chemotherapy being followed by oncologist at [facility].  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  Prior history of provoked PE has completed 6 months of Eliquis .   Since patient has sepsis will need close monitoring further workup and more than 2 midnight stay.     DVT prophylaxis: Lovenox .  Code Status: DNR.  Family Communication: No family at bedside  Consults called: None.   Subjective:  Patient seen and examined at bedside this morning Mental status better Denies nausea vomiting chest pain cough   Physical Exam:  Eyes: Anicteric no pallor. ENMT: No discharge from the ears eyes nose or mouth. Neck: No mass felt.  No neck rigidity. Respiratory: No rhonchi or crepitations. Cardiovascular: S1-S2 heard. Abdomen: Soft nontender bowel sound present. Musculoskeletal: No edema. Skin: No rash. Neurologic: Alert awake oriented to time place and person moving all extremities. Psychiatric: Appears normal.  Normal affect.    Vitals:   12/05/23 1000 12/05/23 1015 12/05/23 1240 12/05/23 1430  BP: 139/66  129/73   Pulse: (!) 58 61 (!) 59   Resp: 14 20 17    Temp:   97.9 F (36.6  C) 97.9 F (36.6 C)  TempSrc:    Oral  SpO2: 100% 98% 100%   Weight:      Height:        Data Reviewed: CT scan of the abdomen and pelvis showed cholelithiasis without complicating findings No findings of acute pulmonary embolism    Latest Ref Rng & Units 12/05/2023    3:23 AM 12/05/2023   12:38 AM 12/04/2023    6:51 PM  CBC  WBC 4.0 - 10.5 K/uL 9.6  9.7  11.1   Hemoglobin 13.0 - 17.0 g/dL  88.2  88.9  87.8   Hematocrit 39.0 - 52.0 % 37.2  34.1  37.6   Platelets 150 - 400 K/uL 209  217  247        Latest Ref Rng & Units 12/05/2023    3:23 AM 12/05/2023   12:38 AM 12/04/2023    6:51 PM  BMP  Glucose 70 - 99 mg/dL 777   734   BUN 8 - 23 mg/dL 13   13   Creatinine 9.38 - 1.24 mg/dL 9.04  9.13  8.92   Sodium 135 - 145 mmol/L 136   138   Potassium 3.5 - 5.1 mmol/L 4.0   3.7   Chloride 98 - 111 mmol/L 104   101   CO2 22 - 32 mmol/L 22   22   Calcium  8.9 - 10.3 mg/dL 8.6   9.3        Author: Drue ONEIDA Potter, MD 12/05/2023 3:59 PM  For on call review [email or web address].

## 2023-12-05 NOTE — Progress Notes (Signed)
 Remote pacemaker transmission.

## 2023-12-05 NOTE — ED Notes (Signed)
 CCMD made aware of pts room change

## 2023-12-05 NOTE — ED Notes (Signed)
 Update given to pts wife

## 2023-12-05 NOTE — ED Notes (Signed)
 Pt's linens changed and pt repositioned in bed. Pt given warm blanket, stretcher locked in lowest position.

## 2023-12-05 NOTE — Plan of Care (Signed)

## 2023-12-05 NOTE — Plan of Care (Signed)
  Problem: Fluid Volume: Goal: Ability to maintain a balanced intake and output will improve Outcome: Progressing   Problem: Metabolic: Goal: Ability to maintain appropriate glucose levels will improve Outcome: Progressing   Problem: Skin Integrity: Goal: Risk for impaired skin integrity will decrease Outcome: Progressing   Problem: Clinical Measurements: Goal: Ability to maintain clinical measurements within normal limits will improve Outcome: Progressing   Problem: Pain Managment: Goal: General experience of comfort will improve and/or be controlled Outcome: Progressing   Problem: Safety: Goal: Ability to remain free from injury will improve Outcome: Progressing   Problem: Skin Integrity: Goal: Risk for impaired skin integrity will decrease Outcome: Progressing

## 2023-12-05 NOTE — Progress Notes (Signed)
 Pharmacy Antibiotic Note  John [name] is a 78 y.o. male admitted on 12/04/2023 , presented with Emesis and Altered Mental Status.  Pharmacy has been consulted for Vancomycin  and Cefepime  dosing for sepsis. Vancomycin  1750 mg IV x 1  and Cefepime  2g IV x 1 given in ED.   Plan:  Cefepime  2g IV q8h  Vancomycin  1750  mg IV 24 Q  hrs. Goal AUC 400-550. Expected AUC: 489.5 SCr used: 1.07 Monitor clinical progress, renal function, cultures and vancomycin  levels per protocol.   Height: 6' (182.9 cm) Weight: 87 kg (191 lb 12.8 oz) IBW/kg (Calculated) : 77.6  Temp (24hrs), Avg:100.1 F (37.8 C), Min:99.2 F (37.3 C), Max:101 F (38.3 C)  Recent Labs  Lab 12/04/23 1851 12/04/23 1942 12/04/23 2130  WBC 11.1*  --   --   CREATININE 1.07  --   --   LATICACIDVEN  --  4.0* 3.8*    Estimated Creatinine Clearance: 63.5 mL/min (by C-G formula based on SCr of 1.07 mg/dL).    Allergies  Allergen Reactions   Ramipril Anaphylaxis   Other Diarrhea    Severe intolerance to Chemotherapy in the past.   Sertraline Other (See Comments)    Extreme headaches   Adhesive [Tape] Rash   Thank you for allowing pharmacy to be a part of this patient's care.  Levorn Gaskins, RPh Clinical Pharmacist 12/05/2023 12:23 AM

## 2023-12-05 NOTE — Progress Notes (Signed)
 PHARMACY - PHYSICIAN COMMUNICATION CRITICAL VALUE ALERT - BLOOD CULTURE IDENTIFICATION (BCID)  John [name] is an 78 y.o. male who presented to [facility] on 12/04/2023 with a chief complaint of confusion  Assessment:  1/3 strep species bacteremia (potential source is unknown, patient does have a pacemaker)  Name of provider Contacted: Drue ONEIDA Potter, MD  Current antibiotics: Cefepime  + Vancomycin   Changes to prescribed antibiotics recommended:  Recommended narrowing to ceftriaxone . Response will be considered by the provider; current antibiotics are likely to cover the isolated organism. Consider de-escalation soon.  Results for orders placed or performed during the hospital encounter of 12/04/23  Blood Culture ID Panel (Reflexed) (Collected: 12/04/2023  7:42 PM)  Result Value Ref Range   Enterococcus faecalis NOT DETECTED NOT DETECTED   Enterococcus Faecium NOT DETECTED NOT DETECTED   Listeria monocytogenes NOT DETECTED NOT DETECTED   Staphylococcus species NOT DETECTED NOT DETECTED   Staphylococcus aureus (BCID) NOT DETECTED NOT DETECTED   Staphylococcus epidermidis NOT DETECTED NOT DETECTED   Staphylococcus lugdunensis NOT DETECTED NOT DETECTED   Streptococcus species DETECTED (A) NOT DETECTED   Streptococcus agalactiae NOT DETECTED NOT DETECTED   Streptococcus pneumoniae NOT DETECTED NOT DETECTED   Streptococcus pyogenes NOT DETECTED NOT DETECTED   A.calcoaceticus-baumannii NOT DETECTED NOT DETECTED   Bacteroides fragilis NOT DETECTED NOT DETECTED   Enterobacterales NOT DETECTED NOT DETECTED   Enterobacter cloacae complex NOT DETECTED NOT DETECTED   Escherichia coli NOT DETECTED NOT DETECTED   Klebsiella aerogenes NOT DETECTED NOT DETECTED   Klebsiella oxytoca NOT DETECTED NOT DETECTED   Klebsiella pneumoniae NOT DETECTED NOT DETECTED   Proteus species NOT DETECTED NOT DETECTED   Salmonella species NOT DETECTED NOT DETECTED   Serratia marcescens NOT DETECTED NOT DETECTED    Haemophilus influenzae NOT DETECTED NOT DETECTED   Neisseria meningitidis NOT DETECTED NOT DETECTED   Pseudomonas aeruginosa NOT DETECTED NOT DETECTED   Stenotrophomonas maltophilia NOT DETECTED NOT DETECTED   Candida albicans NOT DETECTED NOT DETECTED   Candida auris NOT DETECTED NOT DETECTED   Candida glabrata NOT DETECTED NOT DETECTED   Candida krusei NOT DETECTED NOT DETECTED   Candida parapsilosis NOT DETECTED NOT DETECTED   Candida tropicalis NOT DETECTED NOT DETECTED   Cryptococcus neoformans/gattii NOT DETECTED NOT DETECTED    Thank you for allowing pharmacy to be involved with this patient's care.  Mendel Barter, PharmD PGY1 Clinical Pharmacist [facility]  12/05/2023 2:07 PM

## 2023-12-05 NOTE — ED Notes (Signed)
 Pt still refusing to exchange foley catheter

## 2023-12-05 NOTE — ED Notes (Signed)
 Pt transported to room 38 with all belongings, connected to monitor VSS, no distress. Pt declines needs at this time

## 2023-12-06 DIAGNOSIS — A409 Streptococcal sepsis, unspecified: Secondary | ICD-10-CM

## 2023-12-06 DIAGNOSIS — G9341 Metabolic encephalopathy: Secondary | ICD-10-CM | POA: Diagnosis not present

## 2023-12-06 DIAGNOSIS — R6520 Severe sepsis without septic shock: Secondary | ICD-10-CM | POA: Diagnosis not present

## 2023-12-06 LAB — GLUCOSE, CAPILLARY
Glucose-Capillary: 228 mg/dL — ABNORMAL HIGH (ref 70–99)
Glucose-Capillary: 263 mg/dL — ABNORMAL HIGH (ref 70–99)
Glucose-Capillary: 377 mg/dL — ABNORMAL HIGH (ref 70–99)
Glucose-Capillary: 197 mg/dL — ABNORMAL HIGH (ref 70–99)

## 2023-12-06 LAB — CBC
HCT: 35.5 % — ABNORMAL LOW (ref 39.0–52.0)
Hemoglobin: 11.6 g/dL — ABNORMAL LOW (ref 13.0–17.0)
MCH: 27.9 pg (ref 26.0–34.0)
MCHC: 32.7 g/dL (ref 30.0–36.0)
MCV: 85.3 fL (ref 80.0–100.0)
Platelets: 252 K/uL (ref 150–400)
RBC: 4.16 MIL/uL — ABNORMAL LOW (ref 4.22–5.81)
RDW: 18.6 % — ABNORMAL HIGH (ref 11.5–15.5)
WBC: 8.3 K/uL (ref 4.0–10.5)
nRBC: 0 % (ref 0.0–0.2)

## 2023-12-06 LAB — BASIC METABOLIC PANEL WITH GFR
Anion gap: 9 (ref 5–15)
BUN: 12 mg/dL (ref 8–23)
CO2: 26 mmol/L (ref 22–32)
Calcium: 8.9 mg/dL (ref 8.9–10.3)
Chloride: 99 mmol/L (ref 98–111)
Creatinine, Ser: 1 mg/dL (ref 0.61–1.24)
GFR, Estimated: >60 mL/min (ref >60)
Glucose, Bld: 274 mg/dL — ABNORMAL HIGH (ref 70–99)
Potassium: 3.4 mmol/L — ABNORMAL LOW (ref 3.5–5.1)
Sodium: 134 mmol/L — ABNORMAL LOW (ref 135–145)

## 2023-12-06 MED ORDER — HALOPERIDOL 1 MG PO TABS: 1.0000 mg | ORAL_TABLET | Freq: Four times a day (QID) | ORAL | Status: DC | PRN

## 2023-12-06 MED ORDER — HALOPERIDOL LACTATE 5 MG/ML IJ SOLN
INTRAMUSCULAR | Status: AC
Start: 2023-12-06 — End: 2023-12-06
  Filled 2023-12-06: qty 1

## 2023-12-06 MED ORDER — ZIPRASIDONE MESYLATE 20 MG IM SOLR
20.0000 mg | Freq: Once | INTRAMUSCULAR | Status: DC
  Filled 2023-12-06: qty 20

## 2023-12-06 MED ORDER — MELATONIN 5 MG PO TABS
10.0000 mg | ORAL_TABLET | Freq: Every day | ORAL | Status: DC
  Administered 2023-12-06 – 2023-12-09 (×4): 10 mg via ORAL
  Filled 2023-12-06 (×4): qty 2

## 2023-12-06 MED ORDER — HALOPERIDOL LACTATE 5 MG/ML IJ SOLN
1.0000 mg | Freq: Four times a day (QID) | INTRAMUSCULAR | Status: DC | PRN
  Administered 2023-12-06 (×2): 1 mg via INTRAMUSCULAR
  Filled 2023-12-06: qty 1

## 2023-12-06 MED ORDER — CHLORHEXIDINE GLUCONATE CLOTH 2 % EX PADS
6.0000 | MEDICATED_PAD | Freq: Every day | CUTANEOUS | Status: DC
  Administered 2023-12-06 – 2023-12-10 (×5): 6 via TOPICAL

## 2023-12-06 MED ORDER — QUETIAPINE FUMARATE 25 MG PO TABS
50.0000 mg | ORAL_TABLET | Freq: Once | ORAL | Status: DC
  Filled 2023-12-06: qty 2

## 2023-12-06 NOTE — Progress Notes (Signed)
 Progress Note   Patient: John [name] DOB: [birth year] DOA: 12/04/2023     2 DOS: the patient was seen and examined on 12/06/2023     Brief hospital course: From HPI John [name] is a 78 y.o. male with history of breast cancer status postmastectomy and chemotherapy, history of CAD status post DES, history of stroke in January 2025, history of PE completed course of Eliquis  6 months, diabetes mellitus type 2, chronic indwelling Foley catheter was brought to the ER after patient's wife noticed that patient has been in intermittent episodes of confusion over the last 24 hours.  Patient's wife also noted that he has been having some diarrhea recently taking some antibiotics.  I am unable to reach patient's wife at this time.     ED Course: In the ER patient had a temperature of 101 F lactic acid was 4 was given fluid bolus started on empiric antibiotics for sepsis UA is concerning for UTI likely source.  Since patient also has diarrhea stool studies have been ordered.  At the time of my exam patient is become more alert awake.  Lactic acid has improved.  Admitted for sepsis likely source could be UTI.  CT head was unremarkable.  CT chest abdomen pelvis did not show anything acute.     Assessment and Plan:   Sepsis likely UTI.   Acute metabolic encephalopathy-improved Presented with tachycardia with elevated lactic acid consistent with sepsis physiology.   Patient also did have some diarrhea however currently resolved Continue on ceftriaxone  Continue to monitor culture results Infectious disease consulted    Streptococcus species bacteremia Blood culture showing 1 out of 3 bottles positive for strep species Patient has pacemaker in place I have discussed with infectious disease Dr. Constance Passer who recommends repeating blood cultures tomorrow ID plans to see patient tomorrow in the morning  CAD status post DES denies any chest pain.  Takes antiplatelet agents and  statins.     History of cardiomyopathy with a EF of 30 to 35% not on GDMT due to hypotensive episodes.   History of CVA on statins and Plavix .   History of BPH with urinary retention with chronic indwelling Foley catheter last changed about 3 weeks ago. Nursing staff instructed to change patient's Foley if he allows   Diabetes mellitus type 2 recently primary care increased Lantus  insulin  to 15 units.  Last hemoglobin A1c was 7.5.  On sliding scale coverage.   Chronic anemia on iron supplements.   Depression on Lexapro .   GERD on PPI.   History of breast cancer status post mastectomy and chemotherapy being followed by oncologist at [facility].   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   Prior history of provoked PE has completed 6 months of Eliquis .      DVT prophylaxis: Lovenox .   Code Status: DNR.   Family Communication: Discussed with patient's wife at bedside   Consults called: Infectious disease     Subjective:  Patient seen and examined at bedside this morning Denies nausea vomiting abdominal pain or chest pain Mental status improving Patient becomes agitated and combative intermittently     Physical Exam:   Eyes: Anicteric no pallor. ENMT: No discharge from the ears eyes nose or mouth. Neck: No mass felt.  No neck rigidity. Respiratory: No rhonchi or crepitations. Cardiovascular: S1-S2 heard. Abdomen: Soft nontender bowel sound present. Musculoskeletal: No edema. Skin: No rash. Neurologic: Alert awake oriented to time place and person moving all extremities. Psychiatric: Appears normal.  Normal affect.    Data Reviewed:    Latest Ref Rng & Units 12/06/2023   10:35 AM 12/05/2023    3:23 AM 12/05/2023   12:38 AM  BMP  Glucose 70 - 99 mg/dL 725  777    BUN 8 - 23 mg/dL 12  13    Creatinine 9.38 - 1.24 mg/dL 8.99  9.04  9.13   Sodium 135 - 145 mmol/L 134  136    Potassium 3.5 - 5.1 mmol/L 3.4  4.0    Chloride 98 - 111 mmol/L 99  104    CO2  22 - 32 mmol/L 26  22    Calcium  8.9 - 10.3 mg/dL 8.9  8.6         Latest Ref Rng & Units 12/06/2023   10:35 AM 12/05/2023    3:23 AM 12/05/2023   12:38 AM  CBC  WBC 4.0 - 10.5 K/uL 8.3  9.6  9.7   Hemoglobin 13.0 - 17.0 g/dL 88.3  88.2  88.9   Hematocrit 39.0 - 52.0 % 35.5  37.2  34.1   Platelets 150 - 400 K/uL 252  209  217       Vitals:   12/05/23 2127 12/06/23 0417 12/06/23 0736 12/06/23 1204  BP:  (!) 150/65 (!) 155/74 (!) 141/97  Pulse: (!) 59 63 63 66  Resp: 17 18    Temp: 98.1 F (36.7 C) 98.2 F (36.8 C) 97.7 F (36.5 C)   TempSrc: Oral     SpO2: 98% 96% 97% 98%  Weight:      Height:         Author: Drue ONEIDA Potter, MD 12/06/2023 3:51 PM  For on call review [email or web address].

## 2023-12-06 NOTE — Progress Notes (Signed)
 Transition of Care [facility]) - Inpatient Brief Assessment   Patient Details  Name: John [name] MRN: [id number] Date of Birth: [birth year]  [facility] CM/SW Contact:    Rosaline JONELLE Joe, RN Phone Number: 12/06/2023, 4:07 PM   Clinical Narrative: Patient admitted to the hospital with UTI.  Patient lives at home with spouse and plans to return home when stable.  Patient has chronic indwelling foley and has follow up appointment with [facility] on December 13, 2023 with Ryan, MD.  DME at the home includes [place] [facility], [facility] and [facility].  Patient is currently active with [facility] for RN only.  HH orders for [facility] will be placed - to be co-signed by MD for continuance of services.  When stable, the patient's wife plans to provide transportation home by car.   Transition of Care Asessment: Insurance and Status: (P) Insurance coverage has been reviewed Patient has primary care physician: (P) Yes Home environment has been reviewed: (P) from home with spouse Prior level of function:: (P) self Prior/Current Home Services: (P) Current home services (Active with [facility]) Social Drivers of Health Review: (P) SDOH reviewed needs interventions Readmission risk has been reviewed: (P) Yes Transition of care needs: (P) transition of care needs identified, TOC will continue to follow

## 2023-12-06 NOTE — Progress Notes (Signed)
 Pt became very agitated early this am.  He kept removing his tele monitor and IV, then he tried to leave a few times.  MD on call notified, because he would not listen to any staff or take any oral meds.  Then he started threatening staff and IM Haldol  was ordered. Rn attempted to call his wife several times to see if that would calm him down, but she did not answer.

## 2023-12-06 NOTE — Progress Notes (Deleted)
 I called in bacitracin  cream and informed patient's wife and she will be picking this up to be applied to the penis twice a day and will follow-up with urologist.

## 2023-12-06 NOTE — Plan of Care (Signed)

## 2023-12-06 NOTE — Progress Notes (Signed)
 Pt became aggressive and pulling off telemetry after attempting to replace pt refused to allow this nurse to replace notified MD of pt refusing

## 2023-12-06 NOTE — Progress Notes (Signed)
 Pt noted to become argumentative hitting staff cussing and not allowing staff to get him safely to bed stated I'm not in my room and you are trying to trick me  this nurse attempted to redirect pt unsuccessful this nurse and coworkers placed pt in bed pt remained yelling and screaming prn med given per order notified MD of findings placed order for PRN if needed wife aware of pt

## 2023-12-06 NOTE — Inpatient Diabetes Management (Signed)
 Inpatient Diabetes Program Recommendations  AACE/ADA: New Consensus Statement on Inpatient Glycemic Control (2015)  Target Ranges:  Prepandial:   less than 140 mg/dL      Peak postprandial:   less than 180 mg/dL (1-2 hours)      Critically ill patients:  140 - 180 mg/dL   Lab Results  Component Value Date   GLUCAP 228 (H) 12/06/2023   HGBA1C 7.5 (H) 08/06/2023    Review of Glycemic Control  Latest Reference Range & Units 12/05/23 12:49 12/05/23 16:55 12/05/23 22:07 12/06/23 07:34  Glucose-Capillary 70 - 99 mg/dL 860 (H) 861 (H) 779 (H) 228 (H)   Diabetes history: DM Outpatient Diabetes medications:  Lantus  15 units daily Metformin  500 mg bid Current orders for Inpatient glycemic control:  Novolog  0-9 units tid with meals  Lantus  15 units q HS Inpatient Diabetes Program Recommendations:   Consider increasing Lantus  to 18 units daily.    Thanks,  Randall Bullocks, RN, BC-ADM Inpatient Diabetes Coordinator Pager [phone]  (8a-5p)

## 2023-12-07 ENCOUNTER — Ambulatory Visit (HOSPITAL_COMMUNITY)

## 2023-12-07 DIAGNOSIS — A409 Streptococcal sepsis, unspecified: Secondary | ICD-10-CM | POA: Diagnosis not present

## 2023-12-07 LAB — CULTURE, BLOOD (ROUTINE X 2)

## 2023-12-07 LAB — BASIC METABOLIC PANEL WITH GFR
Anion gap: 12 (ref 5–15)
BUN: 11 mg/dL (ref 8–23)
CO2: 25 mmol/L (ref 22–32)
Calcium: 8.7 mg/dL — ABNORMAL LOW (ref 8.9–10.3)
Chloride: 101 mmol/L (ref 98–111)
Creatinine, Ser: 0.87 mg/dL (ref 0.61–1.24)
GFR, Estimated: >60 mL/min (ref >60)
Glucose, Bld: 229 mg/dL — ABNORMAL HIGH (ref 70–99)
Potassium: 3.2 mmol/L — ABNORMAL LOW (ref 3.5–5.1)
Sodium: 138 mmol/L (ref 135–145)

## 2023-12-07 LAB — CBC
HCT: 34.1 % — ABNORMAL LOW (ref 39.0–52.0)
Hemoglobin: 11.2 g/dL — ABNORMAL LOW (ref 13.0–17.0)
MCH: 27.8 pg (ref 26.0–34.0)
MCHC: 32.8 g/dL (ref 30.0–36.0)
MCV: 84.6 fL (ref 80.0–100.0)
Platelets: 217 K/uL (ref 150–400)
RBC: 4.03 MIL/uL — ABNORMAL LOW (ref 4.22–5.81)
RDW: 18.6 % — ABNORMAL HIGH (ref 11.5–15.5)
WBC: 7.8 K/uL (ref 4.0–10.5)
nRBC: 0 % (ref 0.0–0.2)

## 2023-12-07 LAB — URINE CULTURE: Culture: 70000 — AB

## 2023-12-07 LAB — GLUCOSE, CAPILLARY
Glucose-Capillary: 205 mg/dL — ABNORMAL HIGH (ref 70–99)
Glucose-Capillary: 190 mg/dL — ABNORMAL HIGH (ref 70–99)
Glucose-Capillary: 264 mg/dL — ABNORMAL HIGH (ref 70–99)
Glucose-Capillary: 214 mg/dL — ABNORMAL HIGH (ref 70–99)

## 2023-12-07 MED ORDER — INSULIN GLARGINE 100 UNIT/ML ~~LOC~~ SOLN
18.0000 [IU] | Freq: Every day | SUBCUTANEOUS | Status: DC
  Administered 2023-12-07 – 2023-12-09 (×3): 18 [IU] via SUBCUTANEOUS
  Filled 2023-12-07 (×4): qty 0.18

## 2023-12-07 MED ORDER — POTASSIUM CHLORIDE 20 MEQ PO PACK
40.0000 meq | PACK | Freq: Once | ORAL | Status: AC
  Administered 2023-12-07: 40 meq via ORAL
  Filled 2023-12-07: qty 2

## 2023-12-07 MED ORDER — INSULIN ASPART 100 UNIT/ML IJ SOLN
2.0000 [IU] | Freq: Three times a day (TID) | INTRAMUSCULAR | Status: DC
  Administered 2023-12-07 – 2023-12-10 (×7): 2 [IU] via SUBCUTANEOUS

## 2023-12-07 NOTE — Consult Note (Signed)
 [facility]    Date of Admission:  12/04/2023     Reason for Consult: bacteremia    Referring Provider: Dino     Lines:  Peripheral iv's  Abx: 7/25-c ceftriaxone   7/24-25 vanc 7/24-25 cefepime  7/24 flagyl         Assessment: 78 yo male hx cva early this year, with presence of pacemaker, admitted 8/24 for ams/sepsis, found to have viridan strep bacteremia   He really doesn't report any symptoms except that he doesn't remember, fatigue No really subacute/chronic b sx either; previous weight loss since stroke but regaining  8/24 ucx e faecalis 70 k 8/24 one set blood cx obtained grew strep salivarus  This patient does not have uti  His sepsis and viridan strep (only one set obtained prior to abx) does raise concern for potential pacer infection. No other sign to suggest a viral process   Plan: F/u repeat bcx  Can continue abx with ceftriaxone  Tee  If tee is negative will do 2 week antibiotics and can change to oral form to finish course Maintain standard isolation precaution Discussed with primary team     ------------------------------------------------ Principal Problem:   Sepsis (HCC) Active Problems:   Depression, major, single episode, complete remission (HCC)   History of breast cancer   GERD (gastroesophageal reflux disease)   BPH (benign prostatic hyperplasia)   Type 2 diabetes mellitus with hyperglycemia, with long-term current use of insulin  (HCC)   Cognitive impairment   Right middle cerebral artery stroke (HCC)   Hypotension   Iron deficiency anemia   Bladder outlet obstruction   HFrEF (heart failure with reduced ejection fraction) (HCC)   CAD S/P percutaneous coronary angioplasty    HPI: John [name] is a 78 y.o. male hx cva, pacer preent admitted with sepsis/ams  Poor historian  [place] a few days ago he thinks he became confused and was kicked out of the house by his wife. Then saying she  wanted me to go to the hospital.  No other specific complaint including myalgia, uri sx  Had stroke early 2025 and weight loss since but has been regaining  Good appetite, no malaise, no focal pain, rash, headache, vision change   Febrile on admission 101 Bcx one set strep salivarus Ucx only 70k enterococcus; no urinary sx  Sepsis had resolved  Ct abd pelv chest and head no acute abnormality  Family History  Problem Relation Age of Onset   Lymphoma Mother    Cancer Mother    Diabetes Father    Stroke Father    Ovarian cancer Sister    Cancer Sister    Cancer Paternal Grandmother     Social History   Tobacco Use   Smoking status: Never   Smokeless tobacco: Never  Vaping Use   Vaping status: Never Used  Substance Use Topics   Alcohol use: No   Drug use: No    Allergies  Allergen Reactions   Ramipril Anaphylaxis   Adhesive [Tape] Rash   Other Diarrhea    Severe intolerance to Chemotherapy in the past.   Sertraline Other (See Comments)    Extreme headaches    Review of Systems: ROS All Other ROS was negative, except mentioned above   Past Medical History:  Diagnosis Date   BPH (benign prostatic hyperplasia)    CTS (carpal tunnel syndrome)    Depression    Diabetes mellitus    Essential hypertension  GERD (gastroesophageal reflux disease)    History of breast cancer    Hypercholesteremia    Neuropathy    Stroke (HCC)        Scheduled Meds:  atorvastatin   80 mg Oral QHS   Chlorhexidine  Gluconate Cloth  6 each Topical Daily   clopidogrel   75 mg Oral Q breakfast   cyanocobalamin   1,000 mcg Oral Daily   enoxaparin  (LOVENOX ) injection  40 mg Subcutaneous Q24H   escitalopram   10 mg Oral Daily   ferrous sulfate   325 mg Oral Q breakfast   finasteride   5 mg Oral Daily   insulin  aspart  0-9 Units Subcutaneous TID WC   insulin  aspart  2 Units Subcutaneous TID WC   insulin  glargine  18 Units Subcutaneous QHS   melatonin  10 mg Oral QHS    midodrine   10 mg Oral TID WC   pantoprazole   40 mg Oral QHS   QUEtiapine   50 mg Oral Once   ziprasidone   20 mg Intramuscular Once   Continuous Infusions:  cefTRIAXone  (ROCEPHIN )  IV 2 g (12/06/23 1431)   PRN Meds:.acetaminophen  **OR** acetaminophen , haloperidol  **OR** haloperidol  lactate   OBJECTIVE: Blood pressure (!) 141/57, pulse 61, temperature 98.3 F (36.8 C), temperature source Oral, resp. rate 18, height 6' (1.829 m), weight 87 kg, SpO2 96%.  Physical Exam  General/constitutional: no distress, pleasant HEENT: Normocephalic, PER, Conj Clear, EOMI, Oropharynx clear Neck supple CV: rrr no mrg Lungs: clear to auscultation, normal respiratory effort Abd: Soft, Nontender Ext: no edema Skin: No Rash Neuro: nonfocal MSK: no peripheral joint swelling/tenderness/warmth; back spines nontender    Lab Results Lab Results  Component Value Date   WBC 7.8 12/07/2023   HGB 11.2 (L) 12/07/2023   HCT 34.1 (L) 12/07/2023   MCV 84.6 12/07/2023   PLT 217 12/07/2023    Lab Results  Component Value Date   CREATININE 0.87 12/07/2023   BUN 11 12/07/2023   NA 138 12/07/2023   K 3.2 (L) 12/07/2023   CL 101 12/07/2023   CO2 25 12/07/2023    Lab Results  Component Value Date   ALT 20 12/05/2023   AST 21 12/05/2023   GGT 77 (H) 05/08/2023   ALKPHOS 51 12/05/2023   BILITOT 1.4 (H) 12/05/2023      Microbiology: Recent Results (from the past 240 hours)  Urine Culture     Status: Abnormal   Collection Time: 12/04/23  6:51 PM   Specimen: Urine, Random  Result Value Ref Range Status   Specimen Description URINE, RANDOM  Final   Special Requests   Final    NONE Reflexed from [id number] Performed at [facility], [address], [place], [place] [address]    Culture 70,000 COLONIES/mL ENTEROCOCCUS FAECALIS (A)  Final   Report Status 12/07/2023 FINAL  Final   Organism ID, Bacteria ENTEROCOCCUS FAECALIS (A)  Final      Susceptibility   Enterococcus faecalis - MIC*     AMPICILLIN <=2 SENSITIVE Sensitive     NITROFURANTOIN <=16 SENSITIVE Sensitive     VANCOMYCIN  1 SENSITIVE Sensitive     * 70,000 COLONIES/mL ENTEROCOCCUS FAECALIS  Blood culture (routine x 2)     Status: Abnormal   Collection Time: 12/04/23  7:42 PM   Specimen: BLOOD RIGHT ARM  Result Value Ref Range Status   Specimen Description BLOOD RIGHT ARM  Final   Special Requests   Final    BOTTLES DRAWN AEROBIC AND ANAEROBIC Blood Culture results may not be  optimal due to an inadequate volume of blood received in culture bottles   Culture  Setup Time   Final    GRAM POSITIVE COCCI IN CHAINS IN BOTH AEROBIC AND ANAEROBIC BOTTLES Organism ID to follow CRITICAL RESULT CALLED TO, READ BACK BY AND VERIFIED WITH: LOIS BARTER PHARMD, AT 1119 12/05/23 D. VANHOOK Performed at [facility], [address] [place], [place] [address]    Culture STREPTOCOCCUS SALIVARIUS (A)  Final   Report Status 12/07/2023 FINAL  Final   Organism ID, Bacteria STREPTOCOCCUS SALIVARIUS  Final      Susceptibility   Streptococcus salivarius - MIC*    PENICILLIN 1 INTERMEDIATE Intermediate     CEFTRIAXONE  0.5 SENSITIVE Sensitive     ERYTHROMYCIN 2 RESISTANT Resistant     LEVOFLOXACIN 2 SENSITIVE Sensitive     VANCOMYCIN  0.5 SENSITIVE Sensitive     * STREPTOCOCCUS SALIVARIUS  Blood Culture ID Panel (Reflexed)     Status: Abnormal   Collection Time: 12/04/23  7:42 PM  Result Value Ref Range Status   Enterococcus faecalis NOT DETECTED NOT DETECTED Final   Enterococcus Faecium NOT DETECTED NOT DETECTED Final   Listeria monocytogenes NOT DETECTED NOT DETECTED Final   Staphylococcus species NOT DETECTED NOT DETECTED Final   Staphylococcus aureus (BCID) NOT DETECTED NOT DETECTED Final   Staphylococcus epidermidis NOT DETECTED NOT DETECTED Final   Staphylococcus lugdunensis NOT DETECTED NOT DETECTED Final   Streptococcus species DETECTED (A) NOT DETECTED Final    Comment: Not Enterococcus species, Streptococcus  agalactiae, Streptococcus pyogenes, or Streptococcus pneumoniae. CRITICAL RESULT CALLED TO, READ BACK BY AND VERIFIED WITH: LOIS BARTER PHARMD, AT 1119 12/05/23 D. VANHOOK    Streptococcus agalactiae NOT DETECTED NOT DETECTED Final   Streptococcus pneumoniae NOT DETECTED NOT DETECTED Final   Streptococcus pyogenes NOT DETECTED NOT DETECTED Final   A.calcoaceticus-baumannii NOT DETECTED NOT DETECTED Final   Bacteroides fragilis NOT DETECTED NOT DETECTED Final   Enterobacterales NOT DETECTED NOT DETECTED Final   Enterobacter cloacae complex NOT DETECTED NOT DETECTED Final   Escherichia coli NOT DETECTED NOT DETECTED Final   Klebsiella aerogenes NOT DETECTED NOT DETECTED Final   Klebsiella oxytoca NOT DETECTED NOT DETECTED Final   Klebsiella pneumoniae NOT DETECTED NOT DETECTED Final   Proteus species NOT DETECTED NOT DETECTED Final   Salmonella species NOT DETECTED NOT DETECTED Final   Serratia marcescens NOT DETECTED NOT DETECTED Final   Haemophilus influenzae NOT DETECTED NOT DETECTED Final   Neisseria meningitidis NOT DETECTED NOT DETECTED Final   Pseudomonas aeruginosa NOT DETECTED NOT DETECTED Final   Stenotrophomonas maltophilia NOT DETECTED NOT DETECTED Final   Candida albicans NOT DETECTED NOT DETECTED Final   Candida auris NOT DETECTED NOT DETECTED Final   Candida glabrata NOT DETECTED NOT DETECTED Final   Candida krusei NOT DETECTED NOT DETECTED Final   Candida parapsilosis NOT DETECTED NOT DETECTED Final   Candida tropicalis NOT DETECTED NOT DETECTED Final   Cryptococcus neoformans/gattii NOT DETECTED NOT DETECTED Final    Comment: Performed at [facility], [address], [place], [place] [address]  Resp panel by RT-PCR (RSV, Flu A&B, Covid) Anterior Nasal Swab     Status: None   Collection Time: 12/04/23  8:40 PM   Specimen: Anterior Nasal Swab  Result Value Ref Range Status   SARS Coronavirus 2 by RT PCR NEGATIVE NEGATIVE Final   Influenza A by PCR NEGATIVE NEGATIVE  Final   Influenza B by PCR NEGATIVE NEGATIVE Final    Comment: (  NOTE) The Xpert Xpress SARS-CoV-2/FLU/RSV plus assay is intended as an aid in the diagnosis of influenza from Nasopharyngeal swab specimens and should not be used as a sole basis for treatment. Nasal washings and aspirates are unacceptable for Xpert Xpress SARS-CoV-2/FLU/RSV testing.  Fact Sheet for Patients: BloggerCourse.com  Fact Sheet for Healthcare Providers: SeriousBroker.it  This test is not yet approved or cleared by the [place]  FDA and has been authorized for detection and/or diagnosis of SARS-CoV-2 by FDA under an Emergency Use Authorization (EUA). This EUA will remain in effect (meaning this test can be used) for the duration of the COVID-19 declaration under Section 564(b)(1) of the Act, 21 U.S.C. section 360bbb-3(b)(1), unless the authorization is terminated or revoked.     Resp Syncytial Virus by PCR NEGATIVE NEGATIVE Final    Comment: (NOTE) Fact Sheet for Patients: BloggerCourse.com  Fact Sheet for Healthcare Providers: SeriousBroker.it  This test is not yet approved or cleared by the [place]  FDA and has been authorized for detection and/or diagnosis of SARS-CoV-2 by FDA under an Emergency Use Authorization (EUA). This EUA will remain in effect (meaning this test can be used) for the duration of the COVID-19 declaration under Section 564(b)(1) of the Act, 21 U.S.C. section 360bbb-3(b)(1), unless the authorization is terminated or revoked.  Performed at [facility], [address] [place], [place] [address]   Blood culture (routine x 2)     Status: None (Preliminary result)   Collection Time: 12/05/23  3:23 AM   Specimen: BLOOD  Result Value Ref Range Status   Specimen Description BLOOD BLOOD LEFT ARM  Final   Special Requests   Final    BOTTLES DRAWN AEROBIC ONLY Blood  Culture adequate volume   Culture   Final    NO GROWTH 2 DAYS Performed at [facility], [address], [place], [place] [address]    Report Status PENDING  Incomplete     Serology:    Imaging: If present, new imagings (plain films, ct scans, and mri) have been personally visualized and interpreted; radiology reports have been reviewed. Decision making incorporated into the Impression / Recommendations.  8/24 ct chest abd pelv CTA of the chest: No evidence of pulmonary embolism.   Chronic scarring in the right lung.   CT of the abdomen and pelvis: Cholelithiasis without complicating factors.   Changes of prior granulomatous disease.   No acute abnormality is seen.  8/24 ct head No acute abnormality    Constance ONEIDA Passer, MD [facility] [phone] pager    12/07/2023, 1:42 PM

## 2023-12-07 NOTE — Progress Notes (Addendum)
   [facility] has been requested to perform a transesophageal echocardiogram on John [name] for sepsis. Patient has a pacemaker. Wife, [name] Ahlberg was at bedside during consent. Both parties agree with procedure and all questions answered.      The patient does NOT have any absolute or relative contraindications to a Transesophageal Echocardiogram (TEE).  The patient has: History of Reduced Ejection Fraction    After careful review of history and examination, the risks and benefits of transesophageal echocardiogram have been explained including risks of esophageal damage, perforation (1:10,000 risk), bleeding, pharyngeal hematoma as well as other potential complications associated with conscious sedation including aspiration, arrhythmia, respiratory failure and death. Alternatives to treatment were discussed, questions were answered. Patient is willing to proceed.   Bonney Leontine LOISE Garrick, PA-C  12/07/2023 4:28 PM

## 2023-12-07 NOTE — Inpatient Diabetes Management (Signed)
 Inpatient Diabetes Program Recommendations  AACE/ADA: New Consensus Statement on Inpatient Glycemic Control (2025)  Target Ranges:  Prepandial:   less than 140 mg/dL      Peak postprandial:   less than 180 mg/dL (1-2 hours)      Critically ill patients:  140 - 180 mg/dL   Lab Results  Component Value Date   GLUCAP 190 (H) 12/07/2023   HGBA1C 7.5 (H) 08/06/2023    Review of Glycemic Control  Latest Reference Range & Units 12/06/23 21:35 12/07/23 09:34 12/07/23 11:26  Glucose-Capillary 70 - 99 mg/dL 802 (H) 794 (H) 809 (H)   Diabetes history: DM 2 Outpatient Diabetes medications:  Lantus  15 units daily, Metformin  500 mg bid Current orders for Inpatient glycemic control:  Novolog  0-9 units tid with meals  Lantus  15 units q HS Inpatient Diabetes Program Recommendations:   Please consider increasing Lantus  to 18 units daily and add Novolog  meal coverage 2 units tid with meals.   Thanks,  Randall Bullocks, RN, BC-ADM Inpatient Diabetes Coordinator Pager [phone]  (8a-5p)

## 2023-12-07 NOTE — Plan of Care (Signed)

## 2023-12-07 NOTE — H&P (View-Only) (Signed)
   [facility] has been requested to perform a transesophageal echocardiogram on John [name] for sepsis. Patient has a pacemaker. Wife, John [name] was at bedside during consent. Both parties agree with procedure and all questions answered.      The patient does NOT have any absolute or relative contraindications to a Transesophageal Echocardiogram (TEE).  The patient has: History of Reduced Ejection Fraction    After careful review of history and examination, the risks and benefits of transesophageal echocardiogram have been explained including risks of esophageal damage, perforation (1:10,000 risk), bleeding, pharyngeal hematoma as well as other potential complications associated with conscious sedation including aspiration, arrhythmia, respiratory failure and death. Alternatives to treatment were discussed, questions were answered. Patient is willing to proceed.   Bonney Leontine LOISE Garrick, PA-C  12/07/2023 4:28 PM

## 2023-12-07 NOTE — Progress Notes (Addendum)
 PROGRESS NOTE    John [name]  FMW:[id number] DOB: 18-Apr-1945 DOA: 12/04/2023 PCP: [name], DO   Brief Narrative:  78 y.o. male with history of breast cancer status postmastectomy and chemotherapy, history of CAD status post DES, history of stroke in January 2025, history of PE completed course of Eliquis  6 months, diabetes mellitus type 2, chronic indwelling Foley catheter was brought to the ER after patient's wife noticed that patient has been in intermittent episodes of confusion over the last 24 hours. Being treated for UTI and bacteremia, ID and cardiology consulted, he needs TEE.   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   Acute metabolic encephalopathy-improved Patient also did have some diarrhea however currently resolved Continue on ceftriaxone  Continue to monitor culture results Infectious disease consulted     Streptococcus species bacteremia Blood culture showing 1 out of 3 bottles positive for strep species Patient has pacemaker in place ID on board Cardiology consulted for TEE as patient has a pacemaker in place. TEE will be done on 8/28.   CAD status post DES: denies any chest pain.  Takes antiplatelet agents and statins.     History of cardiomyopathy with a EF of 30 to 35% not on GDMT due to hypotensive episodes.   History of CVA: on statins and Plavix .    History of BPH with urinary retention with chronic indwelling Foley catheter last changed about 3 weeks ago. Nursing staff instructed to change patient's Foley if he allows   Diabetes mellitus type 2: recently primary care increased Lantus  insulin  to 15 units.  Last hemoglobin A1c was 7.5.  On sliding scale coverage.   Chronic anemia on iron supplements.   Major moderate Depression on Lexapro .   GERD on PPI.   History of breast cancer status post mastectomy and chemotherapy being followed by oncologist at [facility].   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   Prior history of provoked PE has completed 6 months of Eliquis .  Disposition: Lives at home with his wife.Patient is currently active with [facility] for RN only. DME at the home includes [place] , [facility]W, [facility] and [facility].    DVT prophylaxis: enoxaparin  (LOVENOX ) injection 40 mg Start: 12/04/23 2345     Code Status: Limited: Do not attempt resuscitation (DNR) -DNR-LIMITED -Do Not Intubate/DNI  Family Communication:  None at the bedside Status is: Inpatient Remains inpatient appropriate because: bacteremia, needs TEE    Subjective:  He appears drowsy and slow to respond to the questions. He told me that foley catheter was first placed back in April this year and has been changed 3-4 times since then. He also said  My wife abandoned me yesterday.  Examination:  General exam: Appears calm and comfortable, slow to respond to questions Respiratory system: Clear to auscultation. Respiratory effort normal. Cardiovascular system: S1 & S2 heard, RRR. No JVD, murmurs, rubs, gallops or clicks. No pedal edema. Gastrointestinal system: Abdomen is nondistended,  soft and nontender. No organomegaly or masses felt. Normal bowel sounds heard. Central nervous system: Alert and oriented. No focal neurological deficits. Extremities: Symmetric 5 x 5 power. Skin: No rashes, lesions or ulcers       Diet Orders (From admission, onward)      Start     Ordered   12/04/23 2335  Diet heart healthy/carb modified Room service appropriate? Yes; Fluid consistency: Thin  Diet effective now       Question Answer Comment  Diet-HS Snack? Nothing   Room service appropriate? Yes   Fluid consistency: Thin      12/04/23 2335            Objective: Vitals:   12/06/23 1704 12/06/23 2131 12/07/23 0228 12/07/23 0926  BP: (!) 146/76 (!) 151/64 (!) 145/64 (!) 141/57  Pulse: 72 69 66 61  Resp:  18 17 18   Temp:  97.6 F (36.4 C) (!) 97.5 F (36.4 C) 98.3 F (36.8 C)  TempSrc:  Oral Oral Oral  SpO2: 98% 100% 94% 96%  Weight:      Height:        Intake/Output Summary (Last 24 hours) at 12/07/2023 1016 Last data filed at 12/07/2023 0654 Gross per 24 hour  Intake 920 ml  Output 2350 ml  Net -1430 ml   Filed Weights   12/04/23 1821  Weight: 87 kg    Scheduled Meds:  atorvastatin   80 mg Oral QHS   Chlorhexidine  Gluconate Cloth  6 each Topical Daily   clopidogrel   75 mg Oral Q breakfast   cyanocobalamin   1,000 mcg Oral Daily   enoxaparin  (LOVENOX ) injection  40 mg Subcutaneous Q24H   escitalopram   10 mg Oral Daily   ferrous sulfate   325 mg Oral Q breakfast   finasteride   5 mg Oral Daily   insulin  aspart  0-9 Units Subcutaneous TID WC   insulin  glargine  15 Units Subcutaneous QHS   melatonin  10 mg Oral QHS   midodrine   10 mg Oral TID WC   pantoprazole   40 mg Oral QHS   QUEtiapine   50 mg Oral Once   ziprasidone   20 mg Intramuscular Once   Continuous Infusions:  cefTRIAXone  (ROCEPHIN )  IV 2 g (12/06/23 1431)    Nutritional status     Body mass index is 26.01 kg/m.  Data Reviewed:   CBC: Recent Labs  Lab 12/04/23 1851 12/05/23 0038 12/05/23 0323 12/06/23 1035 12/07/23 0226  WBC 11.1* 9.7 9.6 8.3 7.8  NEUTROABS 8.6*  --   --   --   --   HGB 12.1* 11.0* 11.7* 11.6* 11.2*  HCT 37.6* 34.1* 37.2* 35.5* 34.1*  MCV 86.8 86.8 88.4 85.3 84.6  PLT 247 217 209 252 217   Basic Metabolic Panel: Recent Labs   Lab 12/04/23 1851 12/05/23 0038 12/05/23 0323 12/06/23 1035 12/07/23 0226  NA 138  --  136 134* 138  K 3.7  --  4.0 3.4* 3.2*  CL 101  --  104 99 101  CO2 22  --  22 26 25   GLUCOSE 265*  --  222* 274* 229*  BUN 13  --  13 12 11   CREATININE 1.07 0.86 0.95 1.00 0.87  CALCIUM  9.3  --  8.6* 8.9 8.7*  MG 1.0*  --   --   --   --    GFR: Estimated Creatinine Clearance: 78 mL/min (by C-G formula based on SCr of 0.87 mg/dL). Liver Function Tests: Recent Labs  Lab 12/04/23  1851 12/05/23 0323  AST 26 21  ALT 22 20  ALKPHOS 56 51  BILITOT 1.3* 1.4*  PROT 6.2* 5.6*  ALBUMIN 3.4* 3.0*   Recent Labs  Lab 12/04/23 1851  LIPASE 31   Recent Labs  Lab 12/04/23 1851  AMMONIA 15   Coagulation Profile: No results for input(s): INR, PROTIME in the last 168 hours. Cardiac Enzymes: No results for input(s): CKTOTAL, CKMB, CKMBINDEX, TROPONINI in the last 168 hours. BNP (last 3 results) No results for input(s): PROBNP in the last 8760 hours. HbA1C: No results for input(s): HGBA1C in the last 72 hours. CBG: Recent Labs  Lab 12/06/23 0734 12/06/23 1204 12/06/23 1701 12/06/23 2135 12/07/23 0934  GLUCAP 228* 263* 377* 197* 205*   Lipid Profile: No results for input(s): CHOL, HDL, LDLCALC, TRIG, CHOLHDL, LDLDIRECT in the last 72 hours. Thyroid  Function Tests: No results for input(s): TSH, T4TOTAL, FREET4, T3FREE, THYROIDAB in the last 72 hours. Anemia Panel: No results for input(s): VITAMINB12, FOLATE, FERRITIN, TIBC, IRON, RETICCTPCT in the last 72 hours. Sepsis Labs: Recent Labs  Lab 12/05/23 0038 12/05/23 0105 12/05/23 0323 12/05/23 0330  LATICACIDVEN 2.0* 1.6 1.6 1.6    Recent Results (from the past 240 hours)  Urine Culture     Status: Abnormal   Collection Time: 12/04/23  6:51 PM   Specimen: Urine, Random  Result Value Ref Range Status   Specimen Description URINE, RANDOM  Final   Special Requests   Final     NONE Reflexed from [id number] Performed at [facility], [address] [place], [place] [address]    Culture 70,000 COLONIES/mL ENTEROCOCCUS FAECALIS (A)  Final   Report Status 12/07/2023 FINAL  Final   Organism ID, Bacteria ENTEROCOCCUS FAECALIS (A)  Final      Susceptibility   Enterococcus faecalis - MIC*    AMPICILLIN <=2 SENSITIVE Sensitive     NITROFURANTOIN <=16 SENSITIVE Sensitive     VANCOMYCIN  1 SENSITIVE Sensitive     * 70,000 COLONIES/mL ENTEROCOCCUS FAECALIS  Blood culture (routine x 2)     Status: Abnormal (Preliminary result)   Collection Time: 12/04/23  7:42 PM   Specimen: BLOOD RIGHT ARM  Result Value Ref Range Status   Specimen Description BLOOD RIGHT ARM  Final   Special Requests   Final    BOTTLES DRAWN AEROBIC AND ANAEROBIC Blood Culture results may not be optimal due to an inadequate volume of blood received in culture bottles   Culture  Setup Time   Final    GRAM POSITIVE COCCI IN CHAINS IN BOTH AEROBIC AND ANAEROBIC BOTTLES Organism ID to follow CRITICAL RESULT CALLED TO, READ BACK BY AND VERIFIED WITH: LOIS BARTER PHARMD, AT 1119 12/05/23 D. VANHOOK    Culture (A)  Final    STREPTOCOCCUS SALIVARIUS SUSCEPTIBILITIES TO FOLLOW Performed at [facility], [address], [place], [place] [address]    Report Status PENDING  Incomplete  Blood Culture ID Panel (Reflexed)     Status: Abnormal   Collection Time: 12/04/23  7:42 PM  Result Value Ref Range Status   Enterococcus faecalis NOT DETECTED NOT DETECTED Final   Enterococcus Faecium NOT DETECTED NOT DETECTED Final   Listeria monocytogenes NOT DETECTED NOT DETECTED Final   Staphylococcus species NOT DETECTED NOT DETECTED Final   Staphylococcus aureus (BCID) NOT DETECTED NOT DETECTED Final   Staphylococcus epidermidis NOT DETECTED NOT DETECTED Final   Staphylococcus lugdunensis NOT DETECTED NOT DETECTED Final   Streptococcus species DETECTED (A) NOT  DETECTED Final    Comment: Not Enterococcus  species, Streptococcus agalactiae, Streptococcus pyogenes, or Streptococcus pneumoniae. CRITICAL RESULT CALLED TO, READ BACK BY AND VERIFIED WITH: LOIS BARTER PHARMD, AT 1119 12/05/23 D. VANHOOK    Streptococcus agalactiae NOT DETECTED NOT DETECTED Final   Streptococcus pneumoniae NOT DETECTED NOT DETECTED Final   Streptococcus pyogenes NOT DETECTED NOT DETECTED Final   A.calcoaceticus-baumannii NOT DETECTED NOT DETECTED Final   Bacteroides fragilis NOT DETECTED NOT DETECTED Final   Enterobacterales NOT DETECTED NOT DETECTED Final   Enterobacter cloacae complex NOT DETECTED NOT DETECTED Final   Escherichia coli NOT DETECTED NOT DETECTED Final   Klebsiella aerogenes NOT DETECTED NOT DETECTED Final   Klebsiella oxytoca NOT DETECTED NOT DETECTED Final   Klebsiella pneumoniae NOT DETECTED NOT DETECTED Final   Proteus species NOT DETECTED NOT DETECTED Final   Salmonella species NOT DETECTED NOT DETECTED Final   Serratia marcescens NOT DETECTED NOT DETECTED Final   Haemophilus influenzae NOT DETECTED NOT DETECTED Final   Neisseria meningitidis NOT DETECTED NOT DETECTED Final   Pseudomonas aeruginosa NOT DETECTED NOT DETECTED Final   Stenotrophomonas maltophilia NOT DETECTED NOT DETECTED Final   Candida albicans NOT DETECTED NOT DETECTED Final   Candida auris NOT DETECTED NOT DETECTED Final   Candida glabrata NOT DETECTED NOT DETECTED Final   Candida krusei NOT DETECTED NOT DETECTED Final   Candida parapsilosis NOT DETECTED NOT DETECTED Final   Candida tropicalis NOT DETECTED NOT DETECTED Final   Cryptococcus neoformans/gattii NOT DETECTED NOT DETECTED Final    Comment: Performed at [facility], [address], [place], [place] [address]  Resp panel by RT-PCR (RSV, Flu A&B, Covid) Anterior Nasal Swab     Status: None   Collection Time: 12/04/23  8:40 PM   Specimen: Anterior Nasal Swab  Result Value Ref Range Status   SARS Coronavirus 2 by RT PCR NEGATIVE NEGATIVE Final   Influenza A  by PCR NEGATIVE NEGATIVE Final   Influenza B by PCR NEGATIVE NEGATIVE Final    Comment: (NOTE) The Xpert Xpress SARS-CoV-2/FLU/RSV plus assay is intended as an aid in the diagnosis of influenza from Nasopharyngeal swab specimens and should not be used as a sole basis for treatment. Nasal washings and aspirates are unacceptable for Xpert Xpress SARS-CoV-2/FLU/RSV testing.  Fact Sheet for Patients: BloggerCourse.com  Fact Sheet for Healthcare Providers: SeriousBroker.it  This test is not yet approved or cleared by the [place]  FDA and has been authorized for detection and/or diagnosis of SARS-CoV-2 by FDA under an Emergency Use Authorization (EUA). This EUA will remain in effect (meaning this test can be used) for the duration of the COVID-19 declaration under Section 564(b)(1) of the Act, 21 U.S.C. section 360bbb-3(b)(1), unless the authorization is terminated or revoked.     Resp Syncytial Virus by PCR NEGATIVE NEGATIVE Final    Comment: (NOTE) Fact Sheet for Patients: BloggerCourse.com  Fact Sheet for Healthcare Providers: SeriousBroker.it  This test is not yet approved or cleared by the [place]  FDA and has been authorized for detection and/or diagnosis of SARS-CoV-2 by FDA under an Emergency Use Authorization (EUA). This EUA will remain in effect (meaning this test can be used) for the duration of the COVID-19 declaration under Section 564(b)(1) of the Act, 21 U.S.C. section 360bbb-3(b)(1), unless the authorization is terminated or revoked.  Performed at [facility], [address], [place], [place] [address]   Blood culture (routine x 2)     Status: None (Preliminary result)  Collection Time: 12/05/23  3:23 AM   Specimen: BLOOD  Result Value Ref Range Status   Specimen Description BLOOD BLOOD LEFT ARM  Final   Special Requests   Final    BOTTLES  DRAWN AEROBIC ONLY Blood Culture adequate volume   Culture   Final    NO GROWTH 2 DAYS Performed at [facility], [address] [place], [place] [address]    Report Status PENDING  Incomplete         Radiology Studies: No results found.      LOS: 3 days   Time spent= 41 mins    Deliliah Room, MD [facility]  If 7PM-7AM, please contact night-coverage  12/07/2023, 10:16 AM

## 2023-12-08 ENCOUNTER — Inpatient Hospital Stay (HOSPITAL_COMMUNITY)

## 2023-12-08 ENCOUNTER — Encounter (HOSPITAL_COMMUNITY): Payer: Self-pay | Admitting: Internal Medicine

## 2023-12-08 ENCOUNTER — Telehealth (HOSPITAL_COMMUNITY): Payer: Self-pay | Admitting: Radiology

## 2023-12-08 ENCOUNTER — Encounter (HOSPITAL_COMMUNITY)
Admission: EM | Disposition: A | Discharge status: Home Health | Payer: Self-pay | Source: Home / Self Care | Attending: Internal Medicine

## 2023-12-08 DIAGNOSIS — I340 Nonrheumatic mitral (valve) insufficiency: Secondary | ICD-10-CM

## 2023-12-08 DIAGNOSIS — I4891 Unspecified atrial fibrillation: Secondary | ICD-10-CM

## 2023-12-08 DIAGNOSIS — A409 Streptococcal sepsis, unspecified: Secondary | ICD-10-CM | POA: Diagnosis not present

## 2023-12-08 DIAGNOSIS — I252 Old myocardial infarction: Secondary | ICD-10-CM

## 2023-12-08 DIAGNOSIS — I361 Nonrheumatic tricuspid (valve) insufficiency: Secondary | ICD-10-CM | POA: Diagnosis not present

## 2023-12-08 DIAGNOSIS — I2510 Atherosclerotic heart disease of native coronary artery without angina pectoris: Secondary | ICD-10-CM

## 2023-12-08 HISTORY — PX: TRANSESOPHAGEAL ECHOCARDIOGRAM (CATH LAB): EP1270

## 2023-12-08 LAB — CBC
HCT: 34.8 % — ABNORMAL LOW (ref 39.0–52.0)
Hemoglobin: 11.6 g/dL — ABNORMAL LOW (ref 13.0–17.0)
MCH: 28.4 pg (ref 26.0–34.0)
MCHC: 33.3 g/dL (ref 30.0–36.0)
MCV: 85.3 fL (ref 80.0–100.0)
Platelets: 213 K/uL (ref 150–400)
RBC: 4.08 MIL/uL — ABNORMAL LOW (ref 4.22–5.81)
RDW: 18.8 % — ABNORMAL HIGH (ref 11.5–15.5)
WBC: 7.8 K/uL (ref 4.0–10.5)
nRBC: 0 % (ref 0.0–0.2)

## 2023-12-08 LAB — ECHO TEE

## 2023-12-08 LAB — GLUCOSE, CAPILLARY
Glucose-Capillary: 175 mg/dL — ABNORMAL HIGH (ref 70–99)
Glucose-Capillary: 191 mg/dL — ABNORMAL HIGH (ref 70–99)
Glucose-Capillary: 151 mg/dL — ABNORMAL HIGH (ref 70–99)
Glucose-Capillary: 243 mg/dL — ABNORMAL HIGH (ref 70–99)
Glucose-Capillary: 223 mg/dL — ABNORMAL HIGH (ref 70–99)

## 2023-12-08 LAB — BASIC METABOLIC PANEL WITH GFR
Anion gap: 9 (ref 5–15)
BUN: 12 mg/dL (ref 8–23)
CO2: 26 mmol/L (ref 22–32)
Calcium: 9 mg/dL (ref 8.9–10.3)
Chloride: 105 mmol/L (ref 98–111)
Creatinine, Ser: 0.9 mg/dL (ref 0.61–1.24)
GFR, Estimated: >60 mL/min (ref >60)
Glucose, Bld: 230 mg/dL — ABNORMAL HIGH (ref 70–99)
Potassium: 3.7 mmol/L (ref 3.5–5.1)
Sodium: 140 mmol/L (ref 135–145)

## 2023-12-08 SURGERY — TRANSESOPHAGEAL ECHOCARDIOGRAM (TEE) (CATHLAB)
Anesthesia: Monitor Anesthesia Care

## 2023-12-08 MED ORDER — SODIUM CHLORIDE 0.9 % IV SOLN
INTRAVENOUS | Status: DC | PRN
Start: 2023-12-08 — End: 2023-12-08

## 2023-12-08 MED ORDER — INSULIN ASPART 100 UNIT/ML IJ SOLN
0.0000 [IU] | Freq: Three times a day (TID) | INTRAMUSCULAR | Status: DC
  Administered 2023-12-09: 3 [IU] via SUBCUTANEOUS
  Administered 2023-12-09: 2 [IU] via SUBCUTANEOUS
  Administered 2023-12-09: 5 [IU] via SUBCUTANEOUS
  Administered 2023-12-10: 3 [IU] via SUBCUTANEOUS

## 2023-12-08 MED ORDER — INSULIN ASPART 100 UNIT/ML IJ SOLN
0.0000 [IU] | Freq: Every day | INTRAMUSCULAR | Status: DC
  Administered 2023-12-08: 2 [IU] via SUBCUTANEOUS
  Administered 2023-12-09: 3 [IU] via SUBCUTANEOUS

## 2023-12-08 MED ORDER — LIDOCAINE 2% (20 MG/ML) 5 ML SYRINGE
INTRAMUSCULAR | Status: DC | PRN
  Administered 2023-12-08: 20 mg via INTRAVENOUS

## 2023-12-08 MED ORDER — PROPOFOL 10 MG/ML IV BOLUS
INTRAVENOUS | Status: DC | PRN
  Administered 2023-12-08: 5 mg via INTRAVENOUS
  Administered 2023-12-08: 10 mg via INTRAVENOUS
  Administered 2023-12-08: 5 mg via INTRAVENOUS
  Administered 2023-12-08 (×3): 10 mg via INTRAVENOUS
  Administered 2023-12-08 (×2): 5 mg via INTRAVENOUS

## 2023-12-08 NOTE — Plan of Care (Signed)
  Problem: Coping: Goal: Ability to adjust to condition or change in health will improve Outcome: Progressing   Problem: Education: Goal: Knowledge of General Education information will improve Description: Including pain rating scale, medication(s)/side effects and non-pharmacologic comfort measures Outcome: Progressing   Problem: Coping: Goal: Level of anxiety will decrease Outcome: Progressing

## 2023-12-08 NOTE — Interval H&P Note (Signed)
 History and Physical Interval Note:  12/08/2023 2:23 PM  John [name]  has presented today for surgery, with the diagnosis of bacteremia with PPM in place.  The various methods of treatment have been discussed with the patient and family. After consideration of risks, benefits and other options for treatment, the patient has consented to  Procedure(s): TRANSESOPHAGEAL ECHOCARDIOGRAM (N/A) as a surgical intervention.  The patient's history has been reviewed, patient examined, no change in status, stable for surgery.  I have reviewed the patient's chart and labs.  Questions were answered to the patient's satisfaction.     Annabella Scarce, MD

## 2023-12-08 NOTE — Anesthesia Preprocedure Evaluation (Addendum)
 Anesthesia Evaluation  Patient identified by MRN, date of birth, ID band Patient awake    Reviewed: Allergy & Precautions, NPO status , Patient's Chart, lab work & pertinent test results  Airway Mallampati: III  TM Distance: >3 FB Neck ROM: Full    Dental  (+) Dental Advisory Given, Poor Dentition, Missing, Chipped   Pulmonary neg pulmonary ROS   Pulmonary exam normal breath sounds clear to auscultation       Cardiovascular hypertension, + CAD, + Past MI and +CHF  Normal cardiovascular exam+ dysrhythmias Atrial Fibrillation + pacemaker  Rhythm:Regular Rate:Normal  bacteremia with PPM in place   Neuro/Psych  PSYCHIATRIC DISORDERS  Depression     Neuromuscular disease CVA    GI/Hepatic Neg liver ROS,GERD  Medicated,,  Endo/Other  diabetes, Type 2, Insulin  Dependent, Oral Hypoglycemic Agents    Renal/GU negative Renal ROS     Musculoskeletal negative musculoskeletal ROS (+)    Abdominal   Peds  Hematology  (+) Blood dyscrasia (Plavix )   Anesthesia Other Findings Day of surgery medications reviewed with the patient.  Reproductive/Obstetrics                              Anesthesia Physical Anesthesia Plan  ASA: 4  Anesthesia Plan: MAC   Post-op Pain Management:    Induction: Intravenous  PONV Risk Score and Plan: 1 and TIVA and Treatment may vary due to age or medical condition  Airway Management Planned: Natural Airway and Simple Face Mask  Additional Equipment:   Intra-op Plan:   Post-operative Plan:   Informed Consent: I have reviewed the patients History and Physical, chart, labs and discussed the procedure including the risks, benefits and alternatives for the proposed anesthesia with the patient or authorized representative who has indicated his/her understanding and acceptance.   Patient has DNR.  Discussed DNR with patient and Suspend DNR.   Dental advisory given  Plan  Discussed with: CRNA and Anesthesiologist  Anesthesia Plan Comments:          Anesthesia Quick Evaluation

## 2023-12-08 NOTE — Transfer of Care (Signed)
 Immediate Anesthesia Transfer of Care Note  Patient: John [name]  Procedure(s) Performed: TRANSESOPHAGEAL ECHOCARDIOGRAM  Patient Location: Cath Lab  Anesthesia Type:MAC  Level of Consciousness: sedated, drowsy, patient cooperative, and responds to stimulation  Airway & Oxygen Therapy: Patient Spontanous Breathing and Patient connected to nasal cannula oxygen  Post-op Assessment: Report given to RN and Post -op Vital signs reviewed and stable  Post vital signs: Reviewed and stable  Last Vitals:  Vitals Value Taken Time  BP 100/60 14:47  Temp    Pulse 60   Resp 16   SpO2 100     Last Pain:  Vitals:   12/08/23 1039  TempSrc:   PainSc: 0-No pain      Patients Stated Pain Goal: 0 (12/05/23 2340)  Complications: No notable events documented.

## 2023-12-08 NOTE — Progress Notes (Signed)
 [facility]  Date of Admission:  12/04/2023     Lines:  Peripheral iv's   Abx: 7/25-c ceftriaxone    7/24-25 vanc 7/24-25 cefepime  7/24 flagyl                                                              Assessment: 78 yo male hx cva early this year, with presence of pacemaker, admitted 8/24 for ams/sepsis, found to have viridan strep bacteremia    He really doesn't report any symptoms except that he doesn't remember, fatigue No really subacute/chronic b sx either; previous weight loss since stroke but regaining   8/24 ucx e faecalis 70 k 8/24 one set blood cx obtained grew strep salivarus   This patient does not have uti   His sepsis and viridan strep (only one set obtained prior to abx) does raise concern for potential pacer infection. No other sign to suggest a viral process    ----- 8/28 id assessment Await tee No focal pain  No headache/confusion 8/25 reeat bcx negative    Plan: F/u tee finding Continue ceftriaxone  If repeat bcx negative and tee negative will do 10 day of antibiotics for strep bacteremia Maintain standard isolation precaution Discussed with primary team    Principal Problem:   Sepsis (HCC) Active Problems:   Depression, major, single episode, complete remission (HCC)   History of breast cancer   GERD (gastroesophageal reflux disease)   BPH (benign prostatic hyperplasia)   Type 2 diabetes mellitus with hyperglycemia, with long-term current use of insulin  (HCC)   Cognitive impairment   Right middle cerebral artery stroke (HCC)   Hypotension   Iron deficiency anemia   Bladder outlet obstruction   HFrEF (heart failure with reduced ejection fraction) (HCC)   CAD S/P percutaneous coronary angioplasty   Allergies  Allergen Reactions   Ramipril Anaphylaxis   Adhesive [Tape] Rash   Other Diarrhea    Severe intolerance to Chemotherapy in the past.   Sertraline Other (See Comments)    Extreme  headaches    Scheduled Meds:  atorvastatin   80 mg Oral QHS   Chlorhexidine  Gluconate Cloth  6 each Topical Daily   clopidogrel   75 mg Oral Q breakfast   cyanocobalamin   1,000 mcg Oral Daily   enoxaparin  (LOVENOX ) injection  40 mg Subcutaneous Q24H   escitalopram   10 mg Oral Daily   ferrous sulfate   325 mg Oral Q breakfast   finasteride   5 mg Oral Daily   insulin  aspart  0-9 Units Subcutaneous TID WC   insulin  aspart  2 Units Subcutaneous TID WC   insulin  glargine  18 Units Subcutaneous QHS   melatonin  10 mg Oral QHS   midodrine   10 mg Oral TID WC   pantoprazole   40 mg Oral QHS   QUEtiapine   50 mg Oral Once   ziprasidone   20 mg Intramuscular Once   Continuous Infusions:  cefTRIAXone  (ROCEPHIN )  IV 2 g (12/07/23 1417)   PRN Meds:.acetaminophen  **OR** acetaminophen , haloperidol  **OR** haloperidol  lactate   SUBJECTIVE: No complaint Tee @ 1pm  Review of Systems: ROS All other ROS was negative, except mentioned above     OBJECTIVE: Vitals:   12/07/23 2357 12/08/23 0512 12/08/23 0531 12/08/23 0808  BP: ROLLEN)  161/78 (!) 187/75 (!) 167/82 (!) 163/78  Pulse: (!) 59 60  60  Resp: 20 20 18 18   Temp: 97.9 F (36.6 C) 97.7 F (36.5 C)  97.6 F (36.4 C)  TempSrc:    Oral  SpO2: 96% 97% 94% 98%  Weight:      Height:       Body mass index is 26.01 kg/m.  Physical Exam General/constitutional: no distress, pleasant HEENT: Normocephalic, PER, Conj Clear, EOMI, Oropharynx clear Neck supple CV: rrr no mrg Lungs: clear to auscultation, normal respiratory effort Abd: Soft, Nontender Ext: no edema Skin: No Rash Neuro: nonfocal MSK: no peripheral joint swelling/tenderness/warmth; back spines nontender    Lab Results Lab Results  Component Value Date   WBC 7.8 12/08/2023   HGB 11.6 (L) 12/08/2023   HCT 34.8 (L) 12/08/2023   MCV 85.3 12/08/2023   PLT 213 12/08/2023    Lab Results  Component Value Date   CREATININE 0.90 12/08/2023   BUN 12 12/08/2023   NA 140  12/08/2023   K 3.7 12/08/2023   CL 105 12/08/2023   CO2 26 12/08/2023    Lab Results  Component Value Date   ALT 20 12/05/2023   AST 21 12/05/2023   GGT 77 (H) 05/08/2023   ALKPHOS 51 12/05/2023   BILITOT 1.4 (H) 12/05/2023      Microbiology: Recent Results (from the past 240 hours)  Urine Culture     Status: Abnormal   Collection Time: 12/04/23  6:51 PM   Specimen: Urine, Random  Result Value Ref Range Status   Specimen Description URINE, RANDOM  Final   Special Requests   Final    NONE Reflexed from [id number] Performed at [facility], [address] [place], [place] [address]    Culture 70,000 COLONIES/mL ENTEROCOCCUS FAECALIS (A)  Final   Report Status 12/07/2023 FINAL  Final   Organism ID, Bacteria ENTEROCOCCUS FAECALIS (A)  Final      Susceptibility   Enterococcus faecalis - MIC*    AMPICILLIN <=2 SENSITIVE Sensitive     NITROFURANTOIN <=16 SENSITIVE Sensitive     VANCOMYCIN  1 SENSITIVE Sensitive     * 70,000 COLONIES/mL ENTEROCOCCUS FAECALIS  Blood culture (routine x 2)     Status: Abnormal   Collection Time: 12/04/23  7:42 PM   Specimen: BLOOD RIGHT ARM  Result Value Ref Range Status   Specimen Description BLOOD RIGHT ARM  Final   Special Requests   Final    BOTTLES DRAWN AEROBIC AND ANAEROBIC Blood Culture results may not be optimal due to an inadequate volume of blood received in culture bottles   Culture  Setup Time   Final    GRAM POSITIVE COCCI IN CHAINS IN BOTH AEROBIC AND ANAEROBIC BOTTLES Organism ID to follow CRITICAL RESULT CALLED TO, READ BACK BY AND VERIFIED WITH: LOIS BARTER PHARMD, AT 1119 12/05/23 D. VANHOOK Performed at [facility], [address] [place], [place] [address]    Culture STREPTOCOCCUS SALIVARIUS (A)  Final   Report Status 12/07/2023 FINAL  Final   Organism ID, Bacteria STREPTOCOCCUS SALIVARIUS  Final      Susceptibility   Streptococcus salivarius - MIC*    PENICILLIN 1 INTERMEDIATE Intermediate     CEFTRIAXONE  0.5  SENSITIVE Sensitive     ERYTHROMYCIN 2 RESISTANT Resistant     LEVOFLOXACIN 2 SENSITIVE Sensitive     VANCOMYCIN  0.5 SENSITIVE Sensitive     * STREPTOCOCCUS SALIVARIUS  Blood Culture ID Panel (Reflexed)  Status: Abnormal   Collection Time: 12/04/23  7:42 PM  Result Value Ref Range Status   Enterococcus faecalis NOT DETECTED NOT DETECTED Final   Enterococcus Faecium NOT DETECTED NOT DETECTED Final   Listeria monocytogenes NOT DETECTED NOT DETECTED Final   Staphylococcus species NOT DETECTED NOT DETECTED Final   Staphylococcus aureus (BCID) NOT DETECTED NOT DETECTED Final   Staphylococcus epidermidis NOT DETECTED NOT DETECTED Final   Staphylococcus lugdunensis NOT DETECTED NOT DETECTED Final   Streptococcus species DETECTED (A) NOT DETECTED Final    Comment: Not Enterococcus species, Streptococcus agalactiae, Streptococcus pyogenes, or Streptococcus pneumoniae. CRITICAL RESULT CALLED TO, READ BACK BY AND VERIFIED WITH: LOIS BARTER PHARMD, AT 1119 12/05/23 D. VANHOOK    Streptococcus agalactiae NOT DETECTED NOT DETECTED Final   Streptococcus pneumoniae NOT DETECTED NOT DETECTED Final   Streptococcus pyogenes NOT DETECTED NOT DETECTED Final   A.calcoaceticus-baumannii NOT DETECTED NOT DETECTED Final   Bacteroides fragilis NOT DETECTED NOT DETECTED Final   Enterobacterales NOT DETECTED NOT DETECTED Final   Enterobacter cloacae complex NOT DETECTED NOT DETECTED Final   Escherichia coli NOT DETECTED NOT DETECTED Final   Klebsiella aerogenes NOT DETECTED NOT DETECTED Final   Klebsiella oxytoca NOT DETECTED NOT DETECTED Final   Klebsiella pneumoniae NOT DETECTED NOT DETECTED Final   Proteus species NOT DETECTED NOT DETECTED Final   Salmonella species NOT DETECTED NOT DETECTED Final   Serratia marcescens NOT DETECTED NOT DETECTED Final   Haemophilus influenzae NOT DETECTED NOT DETECTED Final   Neisseria meningitidis NOT DETECTED NOT DETECTED Final   Pseudomonas aeruginosa NOT DETECTED NOT  DETECTED Final   Stenotrophomonas maltophilia NOT DETECTED NOT DETECTED Final   Candida albicans NOT DETECTED NOT DETECTED Final   Candida auris NOT DETECTED NOT DETECTED Final   Candida glabrata NOT DETECTED NOT DETECTED Final   Candida krusei NOT DETECTED NOT DETECTED Final   Candida parapsilosis NOT DETECTED NOT DETECTED Final   Candida tropicalis NOT DETECTED NOT DETECTED Final   Cryptococcus neoformans/gattii NOT DETECTED NOT DETECTED Final    Comment: Performed at [facility], [address] [place], [place] [address]  Resp panel by RT-PCR (RSV, Flu A&B, Covid) Anterior Nasal Swab     Status: None   Collection Time: 12/04/23  8:40 PM   Specimen: Anterior Nasal Swab  Result Value Ref Range Status   SARS Coronavirus 2 by RT PCR NEGATIVE NEGATIVE Final   Influenza A by PCR NEGATIVE NEGATIVE Final   Influenza B by PCR NEGATIVE NEGATIVE Final    Comment: (NOTE) The Xpert Xpress SARS-CoV-2/FLU/RSV plus assay is intended as an aid in the diagnosis of influenza from Nasopharyngeal swab specimens and should not be used as a sole basis for treatment. Nasal washings and aspirates are unacceptable for Xpert Xpress SARS-CoV-2/FLU/RSV testing.  Fact Sheet for Patients: BloggerCourse.com  Fact Sheet for Healthcare Providers: SeriousBroker.it  This test is not yet approved or cleared by the [place]  FDA and has been authorized for detection and/or diagnosis of SARS-CoV-2 by FDA under an Emergency Use Authorization (EUA). This EUA will remain in effect (meaning this test can be used) for the duration of the COVID-19 declaration under Section 564(b)(1) of the Act, 21 U.S.C. section 360bbb-3(b)(1), unless the authorization is terminated or revoked.     Resp Syncytial Virus by PCR NEGATIVE NEGATIVE Final    Comment: (NOTE) Fact Sheet for Patients: BloggerCourse.com  Fact Sheet for Healthcare  Providers: SeriousBroker.it  This test is not yet approved or cleared by the  [place]  FDA and has been authorized for detection and/or diagnosis of SARS-CoV-2 by FDA under an Emergency Use Authorization (EUA). This EUA will remain in effect (meaning this test can be used) for the duration of the COVID-19 declaration under Section 564(b)(1) of the Act, 21 U.S.C. section 360bbb-3(b)(1), unless the authorization is terminated or revoked.  Performed at [facility], [address], [place], [place] [address]   Blood culture (routine x 2)     Status: None (Preliminary result)   Collection Time: 12/05/23  3:23 AM   Specimen: BLOOD  Result Value Ref Range Status   Specimen Description BLOOD BLOOD LEFT ARM  Final   Special Requests   Final    BOTTLES DRAWN AEROBIC ONLY Blood Culture adequate volume   Culture   Final    NO GROWTH 2 DAYS Performed at [facility], [address], [place], [place] [address]    Report Status PENDING  Incomplete     Serology:   Imaging: If present, new imagings (plain films, ct scans, and mri) have been personally visualized and interpreted; radiology reports have been reviewed. Decision making incorporated into the Impression / Recommendations.   Constance ONEIDA Passer, MD [facility] [phone] pager    12/08/2023, 1:43 PM

## 2023-12-08 NOTE — Progress Notes (Signed)
 PROGRESS NOTE    John [name]  FMW:[id number] DOB: 04/30/1945 DOA: 12/04/2023 PCP: [name], DO   Brief Narrative:  78 y.o. male with history of breast cancer status postmastectomy and chemotherapy, history of CAD status post DES, history of stroke in January 2025, history of PE completed course of Eliquis  6 months, diabetes mellitus type 2, chronic indwelling Foley catheter was brought to the ER after patient's wife noticed that patient has been in intermittent episodes of confusion over the last 24 hours. Being treated for UTI and bacteremia, ID and cardiology consulted, he needs TEE to be done today.   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   Acute metabolic encephalopathy-improved Patient also did have some diarrhea however currently resolved Continue on ceftriaxone  Continue to monitor culture results Infectious disease consulted     Streptococcus species bacteremia Blood culture from 8/24 showing 1 out of 3 bottles positive for strep species No growth on repeat blood cultures drawn on 8/25. Patient has pacemaker in place ID on board Cardiology consulted for TEE as patient has a pacemaker in place. TEE will be done today.   CAD status post DES: denies any chest pain.  Takes antiplatelet agents and statins.     History of cardiomyopathy with a EF of 30 to 35% not on GDMT due  to hypotensive episodes.   History of CVA: on statins and Plavix .   History of BPH with urinary retention with chronic indwelling Foley catheter last changed about 3 weeks ago. Nursing staff instructed to change patient's Foley if he allows   Diabetes mellitus type 2: recently primary care increased Lantus  insulin  to 15 units.  Last hemoglobin A1c was 7.5.  On sliding scale coverage.   Chronic anemia on iron supplements.   Major moderate Depression on Lexapro .   GERD on PPI.   History of breast cancer status post mastectomy and chemotherapy being followed by oncologist at [facility].   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   Prior history of provoked PE has completed 6 months of Eliquis .  Disposition: Lives at home with his wife.Patient is currently active with [facility] for RN only. DME at the home includes [place] , RW, [facility] and [employer].    DVT prophylaxis: enoxaparin  (LOVENOX ) injection 40 mg Start: 12/04/23 2345     Code Status: Limited: Do not attempt resuscitation (DNR) -DNR-LIMITED -Do Not Intubate/DNI  Family Communication:  None at the bedside Status is: Inpatient Remains inpatient appropriate because: bacteremia, needs TEE    Subjective:  He said that he had TEE done 20-25 years back so he knows what to expect from it. Denies chest pain, fever or shortness of breath. He slept better last night. He told me that pacemaker was placed in January this year.  Examination:  General exam: Appears calm and comfortable, slow to respond to questions Respiratory system: Clear to auscultation. Respiratory effort normal. Cardiovascular system: S1 & S2 heard, RRR.  No JVD, murmurs, rubs, gallops or clicks. No pedal edema. Gastrointestinal system: Abdomen is nondistended, soft and nontender. No organomegaly or masses felt. Normal bowel sounds heard. Central nervous system: Alert and oriented. No focal neurological deficits. Extremities: Symmetric 5 x 5 power. Skin: No rashes,  lesions or ulcers       Diet Orders (From admission, onward)     Start     Ordered   12/08/23 0001  Diet NPO time specified Except for: Sips with Meds  Diet effective midnight       Comments: Patient to remain NPO until fully awake following the TEE.  Question:  Except for  Answer:  Sips with Meds   12/07/23 1732            Objective: Vitals:   12/07/23 2357 12/08/23 0512 12/08/23 0531 12/08/23 0808  BP: (!) 161/78 (!) 187/75 (!) 167/82 (!) 163/78  Pulse: (!) 59 60  60  Resp: 20 20 18 18   Temp: 97.9 F (36.6 C) 97.7 F (36.5 C)  97.6 F (36.4 C)  TempSrc:    Oral  SpO2: 96% 97% 94% 98%  Weight:      Height:        Intake/Output Summary (Last 24 hours) at 12/08/2023 0939 Last data filed at 12/08/2023 0511 Gross per 24 hour  Intake --  Output 850 ml  Net -850 ml   Filed Weights   12/04/23 1821  Weight: 87 kg    Scheduled Meds:  atorvastatin   80 mg Oral QHS   Chlorhexidine  Gluconate Cloth  6 each Topical Daily   clopidogrel   75 mg Oral Q breakfast   cyanocobalamin   1,000 mcg Oral Daily   enoxaparin  (LOVENOX ) injection  40 mg Subcutaneous Q24H   escitalopram   10 mg Oral Daily   ferrous sulfate   325 mg Oral Q breakfast   finasteride   5 mg Oral Daily   insulin  aspart  0-9 Units Subcutaneous TID WC   insulin  aspart  2 Units Subcutaneous TID WC   insulin  glargine  18 Units Subcutaneous QHS   melatonin  10 mg Oral QHS   midodrine   10 mg Oral TID WC   pantoprazole   40 mg Oral QHS   QUEtiapine   50 mg Oral Once   ziprasidone   20 mg Intramuscular Once   Continuous Infusions:  cefTRIAXone  (ROCEPHIN )  IV 2 g (12/07/23 1417)    Nutritional status     Body mass index is 26.01 kg/m.  Data Reviewed:   CBC: Recent Labs  Lab 12/04/23 1851 12/05/23 0038 12/05/23 0323 12/06/23 1035 12/07/23 0226 12/08/23 0231  WBC 11.1* 9.7 9.6 8.3 7.8 7.8  NEUTROABS 8.6*  --   --   --   --   --   HGB 12.1* 11.0* 11.7* 11.6* 11.2* 11.6*  HCT 37.6* 34.1* 37.2* 35.5*  34.1* 34.8*  MCV 86.8 86.8 88.4 85.3 84.6 85.3  PLT 247 217 209 252 217 213   Basic Metabolic Panel: Recent Labs  Lab 12/04/23 1851 12/05/23 0038 12/05/23 0323 12/06/23 1035 12/07/23 0226 12/08/23 0231  NA 138  --  136 134* 138 140  K 3.7  --  4.0 3.4* 3.2* 3.7  CL 101  --  104 99 101 105  CO2 22  --  22 26 25 26   GLUCOSE 265*  --  222* 274* 229* 230*  BUN 13  --  13 12 11 12   CREATININE 1.07 0.86 0.95 1.00 0.87 0.90  CALCIUM  9.3  --  8.6*  8.9 8.7* 9.0  MG 1.0*  --   --   --   --   --    GFR: Estimated Creatinine Clearance: 75.4 mL/min (by C-G formula based on SCr of 0.9 mg/dL). Liver Function Tests: Recent Labs  Lab 12/04/23 1851 12/05/23 0323  AST 26 21  ALT 22 20  ALKPHOS 56 51  BILITOT 1.3* 1.4*  PROT 6.2* 5.6*  ALBUMIN 3.4* 3.0*   Recent Labs  Lab 12/04/23 1851  LIPASE 31   Recent Labs  Lab 12/04/23 1851  AMMONIA 15   Coagulation Profile: No results for input(s): INR, PROTIME in the last 168 hours. Cardiac Enzymes: No results for input(s): CKTOTAL, CKMB, CKMBINDEX, TROPONINI in the last 168 hours. BNP (last 3 results) No results for input(s): PROBNP in the last 8760 hours. HbA1C: No results for input(s): HGBA1C in the last 72 hours. CBG: Recent Labs  Lab 12/07/23 0934 12/07/23 1126 12/07/23 1625 12/07/23 2110 12/08/23 0806  GLUCAP 205* 190* 264* 214* 175*   Lipid Profile: No results for input(s): CHOL, HDL, LDLCALC, TRIG, CHOLHDL, LDLDIRECT in the last 72 hours. Thyroid  Function Tests: No results for input(s): TSH, T4TOTAL, FREET4, T3FREE, THYROIDAB in the last 72 hours. Anemia Panel: No results for input(s): VITAMINB12, FOLATE, FERRITIN, TIBC, IRON, RETICCTPCT in the last 72 hours. Sepsis Labs: Recent Labs  Lab 12/05/23 0038 12/05/23 0105 12/05/23 0323 12/05/23 0330  LATICACIDVEN 2.0* 1.6 1.6 1.6    Recent Results (from the past 240 hours)  Urine Culture     Status: Abnormal    Collection Time: 12/04/23  6:51 PM   Specimen: Urine, Random  Result Value Ref Range Status   Specimen Description URINE, RANDOM  Final   Special Requests   Final    NONE Reflexed from [id number] Performed at [facility], [address] [place], [place] [address]    Culture 70,000 COLONIES/mL ENTEROCOCCUS FAECALIS (A)  Final   Report Status 12/07/2023 FINAL  Final   Organism ID, Bacteria ENTEROCOCCUS FAECALIS (A)  Final      Susceptibility   Enterococcus faecalis - MIC*    AMPICILLIN <=2 SENSITIVE Sensitive     NITROFURANTOIN <=16 SENSITIVE Sensitive     VANCOMYCIN  1 SENSITIVE Sensitive     * 70,000 COLONIES/mL ENTEROCOCCUS FAECALIS  Blood culture (routine x 2)     Status: Abnormal   Collection Time: 12/04/23  7:42 PM   Specimen: BLOOD RIGHT ARM  Result Value Ref Range Status   Specimen Description BLOOD RIGHT ARM  Final   Special Requests   Final    BOTTLES DRAWN AEROBIC AND ANAEROBIC Blood Culture results may not be optimal due to an inadequate volume of blood received in culture bottles   Culture  Setup Time   Final    GRAM POSITIVE COCCI IN CHAINS IN BOTH AEROBIC AND ANAEROBIC BOTTLES Organism ID to follow CRITICAL RESULT CALLED TO, READ BACK BY AND VERIFIED WITH: LOIS BARTER PHARMD, AT 1119 12/05/23 D. VANHOOK Performed at [facility], [address] [place], [place] [address]    Culture STREPTOCOCCUS SALIVARIUS (A)  Final   Report Status 12/07/2023 FINAL  Final   Organism ID, Bacteria STREPTOCOCCUS SALIVARIUS  Final      Susceptibility   Streptococcus salivarius - MIC*    PENICILLIN 1 INTERMEDIATE Intermediate     CEFTRIAXONE  0.5 SENSITIVE Sensitive     ERYTHROMYCIN 2 RESISTANT Resistant     LEVOFLOXACIN 2 SENSITIVE Sensitive     VANCOMYCIN  0.5 SENSITIVE  Sensitive     * STREPTOCOCCUS SALIVARIUS  Blood Culture ID Panel (Reflexed)     Status: Abnormal   Collection Time: 12/04/23  7:42 PM  Result Value Ref Range Status   Enterococcus faecalis NOT DETECTED NOT  DETECTED Final   Enterococcus Faecium NOT DETECTED NOT DETECTED Final   Listeria monocytogenes NOT DETECTED NOT DETECTED Final   Staphylococcus species NOT DETECTED NOT DETECTED Final   Staphylococcus aureus (BCID) NOT DETECTED NOT DETECTED Final   Staphylococcus epidermidis NOT DETECTED NOT DETECTED Final   Staphylococcus lugdunensis NOT DETECTED NOT DETECTED Final   Streptococcus species DETECTED (A) NOT DETECTED Final    Comment: Not Enterococcus species, Streptococcus agalactiae, Streptococcus pyogenes, or Streptococcus pneumoniae. CRITICAL RESULT CALLED TO, READ BACK BY AND VERIFIED WITH: LOIS BARTER PHARMD, AT 1119 12/05/23 D. VANHOOK    Streptococcus agalactiae NOT DETECTED NOT DETECTED Final   Streptococcus pneumoniae NOT DETECTED NOT DETECTED Final   Streptococcus pyogenes NOT DETECTED NOT DETECTED Final   A.calcoaceticus-baumannii NOT DETECTED NOT DETECTED Final   Bacteroides fragilis NOT DETECTED NOT DETECTED Final   Enterobacterales NOT DETECTED NOT DETECTED Final   Enterobacter cloacae complex NOT DETECTED NOT DETECTED Final   Escherichia coli NOT DETECTED NOT DETECTED Final   Klebsiella aerogenes NOT DETECTED NOT DETECTED Final   Klebsiella oxytoca NOT DETECTED NOT DETECTED Final   Klebsiella pneumoniae NOT DETECTED NOT DETECTED Final   Proteus species NOT DETECTED NOT DETECTED Final   Salmonella species NOT DETECTED NOT DETECTED Final   Serratia marcescens NOT DETECTED NOT DETECTED Final   Haemophilus influenzae NOT DETECTED NOT DETECTED Final   Neisseria meningitidis NOT DETECTED NOT DETECTED Final   Pseudomonas aeruginosa NOT DETECTED NOT DETECTED Final   Stenotrophomonas maltophilia NOT DETECTED NOT DETECTED Final   Candida albicans NOT DETECTED NOT DETECTED Final   Candida auris NOT DETECTED NOT DETECTED Final   Candida glabrata NOT DETECTED NOT DETECTED Final   Candida krusei NOT DETECTED NOT DETECTED Final   Candida parapsilosis NOT DETECTED NOT DETECTED Final    Candida tropicalis NOT DETECTED NOT DETECTED Final   Cryptococcus neoformans/gattii NOT DETECTED NOT DETECTED Final    Comment: Performed at [facility], [address], [place], [place] [address]  Resp panel by RT-PCR (RSV, Flu A&B, Covid) Anterior Nasal Swab     Status: None   Collection Time: 12/04/23  8:40 PM   Specimen: Anterior Nasal Swab  Result Value Ref Range Status   SARS Coronavirus 2 by RT PCR NEGATIVE NEGATIVE Final   Influenza A by PCR NEGATIVE NEGATIVE Final   Influenza B by PCR NEGATIVE NEGATIVE Final    Comment: (NOTE) The Xpert Xpress SARS-CoV-2/FLU/RSV plus assay is intended as an aid in the diagnosis of influenza from Nasopharyngeal swab specimens and should not be used as a sole basis for treatment. Nasal washings and aspirates are unacceptable for Xpert Xpress SARS-CoV-2/FLU/RSV testing.  Fact Sheet for Patients: BloggerCourse.com  Fact Sheet for Healthcare Providers: SeriousBroker.it  This test is not yet approved or cleared by the [place]  FDA and has been authorized for detection and/or diagnosis of SARS-CoV-2 by FDA under an Emergency Use Authorization (EUA). This EUA will remain in effect (meaning this test can be used) for the duration of the COVID-19 declaration under Section 564(b)(1) of the Act, 21 U.S.C. section 360bbb-3(b)(1), unless the authorization is terminated or revoked.     Resp Syncytial Virus by PCR NEGATIVE NEGATIVE Final    Comment: (NOTE) Fact Sheet for Patients: BloggerCourse.com  Fact Sheet for Healthcare Providers: SeriousBroker.it  This test is not yet approved or cleared by the [place]  FDA and has been authorized for detection and/or diagnosis of SARS-CoV-2 by FDA under an Emergency Use Authorization (EUA). This EUA will remain in effect (meaning this test can be used) for the duration of the COVID-19  declaration under Section 564(b)(1) of the Act, 21 U.S.C. section 360bbb-3(b)(1), unless the authorization is terminated or revoked.  Performed at [facility], [address] [place], [place] [address]   Blood culture (routine x 2)     Status: None (Preliminary result)   Collection Time: 12/05/23  3:23 AM   Specimen: BLOOD  Result Value Ref Range Status   Specimen Description BLOOD BLOOD LEFT ARM  Final   Special Requests   Final    BOTTLES DRAWN AEROBIC ONLY Blood Culture adequate volume   Culture   Final    NO GROWTH 2 DAYS Performed at [facility], [address], [place], [place] [address]    Report Status PENDING  Incomplete         Radiology Studies: No results found.      LOS: 4 days   Time spent= 41 mins    Deliliah Room, MD [facility]  If 7PM-7AM, please contact night-coverage  12/08/2023, 9:39 AM

## 2023-12-08 NOTE — CV Procedure (Addendum)
 Brief TEE Note  LVEF 30-35%.  Global hypokinesis. No LA/LAA thrombus or masses Mild mitral regurgitation. No evidence of endocarditis.  For additional details see full report.   Jericho Alcorn C. Raford, MD, FACC 12/08/2023 3:59 PM

## 2023-12-08 NOTE — Progress Notes (Signed)
 Pt acknowledges understanding of TEE & signed Consent.  Pt states I've had (a TEE) done before. And, 'they' talked with me about it earlier. Pt alert & oriented.  Per Cardiology:  Wife, [name] Huynh was at bedside during consent. Both parties agree with procedure and all questions answered.  Will continue to monitor.

## 2023-12-08 NOTE — Progress Notes (Signed)
  Echocardiogram Echocardiogram Transesophageal has been performed.  Koleen KANDICE Popper, RDCS 12/08/2023, 2:52 PM

## 2023-12-08 NOTE — Plan of Care (Signed)

## 2023-12-08 NOTE — Telephone Encounter (Signed)
 Patient's wife called. Wants to cancel limited echocardiogram because patient is having a TEE in the hospital today.

## 2023-12-08 NOTE — Anesthesia Postprocedure Evaluation (Signed)
 Anesthesia Post Note  Patient: John [name]  Procedure(s) Performed: TRANSESOPHAGEAL ECHOCARDIOGRAM     Patient location during evaluation: Cath Lab Anesthesia Type: MAC Level of consciousness: awake and alert Pain management: pain level controlled Vital Signs Assessment: post-procedure vital signs reviewed and stable Respiratory status: spontaneous breathing, nonlabored ventilation, respiratory function stable and patient connected to nasal cannula oxygen Cardiovascular status: stable and blood pressure returned to baseline Postop Assessment: no apparent nausea or vomiting Anesthetic complications: no   No notable events documented.  Last Vitals:  Vitals:   12/08/23 1517 12/08/23 1536  BP: 136/63 (!) 165/74  Pulse:  68  Resp:    Temp:  36.4 C  SpO2: 95%     Last Pain:  Vitals:   12/08/23 1536  TempSrc: Oral  PainSc:                  Garnette FORBES Skillern

## 2023-12-09 ENCOUNTER — Other Ambulatory Visit (HOSPITAL_COMMUNITY): Payer: Self-pay

## 2023-12-09 ENCOUNTER — Encounter (HOSPITAL_COMMUNITY): Payer: Self-pay | Admitting: Cardiovascular Disease

## 2023-12-09 ENCOUNTER — Telehealth (HOSPITAL_COMMUNITY): Payer: Self-pay

## 2023-12-09 DIAGNOSIS — R7881 Bacteremia: Secondary | ICD-10-CM | POA: Diagnosis not present

## 2023-12-09 DIAGNOSIS — B955 Unspecified streptococcus as the cause of diseases classified elsewhere: Secondary | ICD-10-CM | POA: Diagnosis not present

## 2023-12-09 DIAGNOSIS — R112 Nausea with vomiting, unspecified: Secondary | ICD-10-CM

## 2023-12-09 DIAGNOSIS — R4182 Altered mental status, unspecified: Secondary | ICD-10-CM

## 2023-12-09 DIAGNOSIS — B954 Other streptococcus as the cause of diseases classified elsewhere: Secondary | ICD-10-CM

## 2023-12-09 DIAGNOSIS — Z950 Presence of cardiac pacemaker: Secondary | ICD-10-CM

## 2023-12-09 LAB — GLUCOSE, CAPILLARY
Glucose-Capillary: 208 mg/dL — ABNORMAL HIGH (ref 70–99)
Glucose-Capillary: 170 mg/dL — ABNORMAL HIGH (ref 70–99)
Glucose-Capillary: 277 mg/dL — ABNORMAL HIGH (ref 70–99)
Glucose-Capillary: 271 mg/dL — ABNORMAL HIGH (ref 70–99)

## 2023-12-09 MED ORDER — LINEZOLID 600 MG PO TABS
600.0000 mg | ORAL_TABLET | Freq: Two times a day (BID) | ORAL | Status: DC
  Administered 2023-12-09 – 2023-12-10 (×3): 600 mg via ORAL
  Filled 2023-12-09 (×3): qty 1

## 2023-12-09 MED ORDER — LINEZOLID 600 MG PO TABS
600.0000 mg | ORAL_TABLET | Freq: Two times a day (BID) | ORAL | 0 refills | Status: AC
  Filled 2023-12-09: qty 18, 9d supply, fill #0

## 2023-12-09 NOTE — Progress Notes (Signed)
 Subjective:  Patient is eager to go home today  Antibiotics:  Anti-infectives (From admission, onward)    Start     Dose/Rate Route Frequency Ordered Stop   12/09/23 1145  linezolid  (ZYVOX ) tablet 600 mg        600 mg Oral Every 12 hours 12/09/23 1046 12/18/23 0959   12/05/23 2200  vancomycin  (VANCOREADY) IVPB 1750 mg/350 mL  Status:  Discontinued        1,750 mg 175 mL/hr over 120 Minutes Intravenous Every 24 hours 12/05/23 0021 12/05/23 1406   12/05/23 1500  cefTRIAXone  (ROCEPHIN ) 2 g in sodium chloride  0.9 % 100 mL IVPB  Status:  Discontinued        2 g 200 mL/hr over 30 Minutes Intravenous Every 24 hours 12/05/23 1406 12/09/23 1046   12/05/23 0600  ceFEPIme  (MAXIPIME ) 2 g in sodium chloride  0.9 % 100 mL IVPB  Status:  Discontinued        2 g 200 mL/hr over 30 Minutes Intravenous Every 8 hours 12/05/23 0010 12/05/23 1406   12/04/23 2100  vancomycin  (VANCOREADY) IVPB 1750 mg/350 mL        1,750 mg 175 mL/hr over 120 Minutes Intravenous  Once 12/04/23 2045 12/05/23 0156   12/04/23 2045  ceFEPIme  (MAXIPIME ) 2 g in sodium chloride  0.9 % 100 mL IVPB        2 g 200 mL/hr over 30 Minutes Intravenous  Once 12/04/23 2040 12/04/23 2146   12/04/23 2045  metroNIDAZOLE  (FLAGYL ) IVPB 500 mg        500 mg 100 mL/hr over 60 Minutes Intravenous  Once 12/04/23 2040 12/04/23 2345   12/04/23 2045  vancomycin  (VANCOCIN ) IVPB 1000 mg/200 mL premix  Status:  Discontinued        1,000 mg 200 mL/hr over 60 Minutes Intravenous  Once 12/04/23 2040 12/04/23 2045       Medications: Scheduled Meds:  atorvastatin   80 mg Oral QHS   Chlorhexidine  Gluconate Cloth  6 each Topical Daily   clopidogrel   75 mg Oral Q breakfast   cyanocobalamin   1,000 mcg Oral Daily   enoxaparin  (LOVENOX ) injection  40 mg Subcutaneous Q24H   escitalopram   10 mg Oral Daily   ferrous sulfate   325 mg Oral Q breakfast   finasteride   5 mg Oral Daily   insulin  aspart  0-5 Units Subcutaneous QHS   insulin  aspart  0-9  Units Subcutaneous TID WC   insulin  aspart  2 Units Subcutaneous TID WC   insulin  glargine  18 Units Subcutaneous QHS   linezolid   600 mg Oral Q12H   melatonin  10 mg Oral QHS   midodrine   10 mg Oral TID WC   pantoprazole   40 mg Oral QHS   QUEtiapine   50 mg Oral Once   ziprasidone   20 mg Intramuscular Once   Continuous Infusions: PRN Meds:.acetaminophen  **OR** acetaminophen , haloperidol  **OR** haloperidol  lactate    Objective: Weight change:   Intake/Output Summary (Last 24 hours) at 12/09/2023 1327 Last data filed at 12/09/2023 0645 Gross per 24 hour  Intake 204.28 ml  Output 725 ml  Net -520.72 ml   Blood pressure (!) 156/78, pulse 79, temperature 98.8 F (37.1 C), temperature source Oral, resp. rate 16, height 6' (1.829 m), weight 87 kg, SpO2 95%. Temp:  [97.6 F (36.4 C)-98.8 F (37.1 C)] 98.8 F (37.1 C) (08/29 0845) Pulse Rate:  [60-79] 79 (08/29 1211) Resp:  [16-20] 16 (08/29 1211) BP: (100-178)/(60-78) 156/78 (08/29  1211) SpO2:  [95 %-99 %] 95 % (08/29 1211)  Physical Exam: Physical Exam Constitutional:      Appearance: He is well-developed.  HENT:     Head: Normocephalic and atraumatic.  Eyes:     Conjunctiva/sclera: Conjunctivae normal.  Cardiovascular:     Rate and Rhythm: Normal rate and regular rhythm.  Pulmonary:     Effort: Pulmonary effort is normal. No respiratory distress.     Breath sounds: No wheezing.  Abdominal:     General: There is no distension.     Palpations: Abdomen is soft.  Musculoskeletal:        General: Normal range of motion.     Cervical back: Normal range of motion and neck supple.  Skin:    General: Skin is warm and dry.     Findings: No erythema or rash.  Neurological:     General: No focal deficit present.     Mental Status: He is alert and oriented to person, place, and time.  Psychiatric:        Mood and Affect: Mood normal.        Behavior: Behavior normal.        Thought Content: Thought content normal.         Judgment: Judgment normal.      CBC:    BMET Recent Labs    12/07/23 0226 12/08/23 0231  NA 138 140  K 3.2* 3.7  CL 101 105  CO2 25 26  GLUCOSE 229* 230*  BUN 11 12  CREATININE 0.87 0.90  CALCIUM  8.7* 9.0     Liver Panel  No results for input(s): PROT, ALBUMIN, AST, ALT, ALKPHOS, BILITOT, BILIDIR, IBILI in the last 72 hours.     Sedimentation Rate No results for input(s): ESRSEDRATE in the last 72 hours. C-Reactive Protein No results for input(s): CRP in the last 72 hours.  Micro Results: Recent Results (from the past 720 hours)  Urine Culture     Status: Abnormal   Collection Time: 12/04/23  6:51 PM   Specimen: Urine, Random  Result Value Ref Range Status   Specimen Description URINE, RANDOM  Final   Special Requests   Final    NONE Reflexed from [id number] Performed at [facility], [address] [place], [place] [address]    Culture 70,000 COLONIES/mL ENTEROCOCCUS FAECALIS (A)  Final   Report Status 12/07/2023 FINAL  Final   Organism ID, Bacteria ENTEROCOCCUS FAECALIS (A)  Final      Susceptibility   Enterococcus faecalis - MIC*    AMPICILLIN <=2 SENSITIVE Sensitive     NITROFURANTOIN <=16 SENSITIVE Sensitive     VANCOMYCIN  1 SENSITIVE Sensitive     * 70,000 COLONIES/mL ENTEROCOCCUS FAECALIS  Blood culture (routine x 2)     Status: Abnormal   Collection Time: 12/04/23  7:42 PM   Specimen: BLOOD RIGHT ARM  Result Value Ref Range Status   Specimen Description BLOOD RIGHT ARM  Final   Special Requests   Final    BOTTLES DRAWN AEROBIC AND ANAEROBIC Blood Culture results may not be optimal due to an inadequate volume of blood received in culture bottles   Culture  Setup Time   Final    GRAM POSITIVE COCCI IN CHAINS IN BOTH AEROBIC AND ANAEROBIC BOTTLES Organism ID to follow CRITICAL RESULT CALLED TO, READ BACK BY AND VERIFIED WITH: LOIS BARTER PHARMD, AT 1119 12/05/23 D. VANHOOK Performed at [facility], [address], [place],  [place] [address]    Culture STREPTOCOCCUS SALIVARIUS (A)  Final   Report Status 12/07/2023 FINAL  Final   Organism ID, Bacteria STREPTOCOCCUS SALIVARIUS  Final      Susceptibility   Streptococcus salivarius - MIC*    PENICILLIN 1 INTERMEDIATE Intermediate     CEFTRIAXONE  0.5 SENSITIVE Sensitive     ERYTHROMYCIN 2 RESISTANT Resistant     LEVOFLOXACIN 2 SENSITIVE Sensitive     VANCOMYCIN  0.5 SENSITIVE Sensitive     * STREPTOCOCCUS SALIVARIUS  Blood Culture ID Panel (Reflexed)     Status: Abnormal   Collection Time: 12/04/23  7:42 PM  Result Value Ref Range Status   Enterococcus faecalis NOT DETECTED NOT DETECTED Final   Enterococcus Faecium NOT DETECTED NOT DETECTED Final   Listeria monocytogenes NOT DETECTED NOT DETECTED Final   Staphylococcus species NOT DETECTED NOT DETECTED Final   Staphylococcus aureus (BCID) NOT DETECTED NOT DETECTED Final   Staphylococcus epidermidis NOT DETECTED NOT DETECTED Final   Staphylococcus lugdunensis NOT DETECTED NOT DETECTED Final   Streptococcus species DETECTED (A) NOT DETECTED Final    Comment: Not Enterococcus species, Streptococcus agalactiae, Streptococcus pyogenes, or Streptococcus pneumoniae. CRITICAL RESULT CALLED TO, READ BACK BY AND VERIFIED WITH: LOIS BARTER PHARMD, AT 1119 12/05/23 D. VANHOOK    Streptococcus agalactiae NOT DETECTED NOT DETECTED Final   Streptococcus pneumoniae NOT DETECTED NOT DETECTED Final   Streptococcus pyogenes NOT DETECTED NOT DETECTED Final   A.calcoaceticus-baumannii NOT DETECTED NOT DETECTED Final   Bacteroides fragilis NOT DETECTED NOT DETECTED Final   Enterobacterales NOT DETECTED NOT DETECTED Final   Enterobacter cloacae complex NOT DETECTED NOT DETECTED Final   Escherichia coli NOT DETECTED NOT DETECTED Final   Klebsiella aerogenes NOT DETECTED NOT DETECTED Final   Klebsiella oxytoca NOT DETECTED NOT DETECTED Final   Klebsiella pneumoniae NOT DETECTED NOT DETECTED Final   Proteus species NOT  DETECTED NOT DETECTED Final   Salmonella species NOT DETECTED NOT DETECTED Final   Serratia marcescens NOT DETECTED NOT DETECTED Final   Haemophilus influenzae NOT DETECTED NOT DETECTED Final   Neisseria meningitidis NOT DETECTED NOT DETECTED Final   Pseudomonas aeruginosa NOT DETECTED NOT DETECTED Final   Stenotrophomonas maltophilia NOT DETECTED NOT DETECTED Final   Candida albicans NOT DETECTED NOT DETECTED Final   Candida auris NOT DETECTED NOT DETECTED Final   Candida glabrata NOT DETECTED NOT DETECTED Final   Candida krusei NOT DETECTED NOT DETECTED Final   Candida parapsilosis NOT DETECTED NOT DETECTED Final   Candida tropicalis NOT DETECTED NOT DETECTED Final   Cryptococcus neoformans/gattii NOT DETECTED NOT DETECTED Final    Comment: Performed at [facility], [address], [place], [place] [address]  Resp panel by RT-PCR (RSV, Flu A&B, Covid) Anterior Nasal Swab     Status: None   Collection Time: 12/04/23  8:40 PM   Specimen: Anterior Nasal Swab  Result Value Ref Range Status   SARS Coronavirus 2 by RT PCR NEGATIVE NEGATIVE Final   Influenza A by PCR NEGATIVE NEGATIVE Final   Influenza B by PCR NEGATIVE NEGATIVE Final    Comment: (NOTE) The Xpert Xpress SARS-CoV-2/FLU/RSV plus assay is intended as an aid in the diagnosis of influenza from Nasopharyngeal swab specimens and should not be used as a sole basis for treatment. Nasal washings and aspirates are unacceptable for Xpert Xpress SARS-CoV-2/FLU/RSV testing.  Fact Sheet for Patients: BloggerCourse.com  Fact Sheet for Healthcare Providers: SeriousBroker.it  This test is not yet approved or cleared by the [place]   FDA and has been authorized for detection and/or diagnosis of SARS-CoV-2 by FDA under an Emergency Use Authorization (EUA). This EUA will remain in effect (meaning this test can be used) for the duration of the COVID-19 declaration under Section  564(b)(1) of the Act, 21 U.S.C. section 360bbb-3(b)(1), unless the authorization is terminated or revoked.     Resp Syncytial Virus by PCR NEGATIVE NEGATIVE Final    Comment: (NOTE) Fact Sheet for Patients: BloggerCourse.com  Fact Sheet for Healthcare Providers: SeriousBroker.it  This test is not yet approved or cleared by the [place]  FDA and has been authorized for detection and/or diagnosis of SARS-CoV-2 by FDA under an Emergency Use Authorization (EUA). This EUA will remain in effect (meaning this test can be used) for the duration of the COVID-19 declaration under Section 564(b)(1) of the Act, 21 U.S.C. section 360bbb-3(b)(1), unless the authorization is terminated or revoked.  Performed at [facility], [address] [place], [place] [address]   Blood culture (routine x 2)     Status: None (Preliminary result)   Collection Time: 12/05/23  3:23 AM   Specimen: BLOOD  Result Value Ref Range Status   Specimen Description BLOOD BLOOD LEFT ARM  Final   Special Requests   Final    BOTTLES DRAWN AEROBIC ONLY Blood Culture adequate volume   Culture   Final    NO GROWTH 4 DAYS Performed at [facility], [address] [place], [place] [address]    Report Status PENDING  Incomplete    Studies/Results: ECHO TEE Result Date: 12/08/2023    TRANSESOPHOGEAL ECHO REPORT   Patient Name:   John [name] Date of Exam: 12/08/2023 Medical Rec #:  [id number]        Height:       72.0 in Accession #:    [id number]       Weight:       191.8 lb Date of Birth:  Aug 22, 1945        BSA:          2.093 m Patient Age:    78 years         BP:           167/82 mmHg Patient Gender: M                HR:           60 bpm. Exam Location:  Inpatient Procedure: Transesophageal Echo, Cardiac Doppler and Color Doppler (Both            Spectral and Color Flow Doppler were utilized during procedure). Indications:     Endocarditis  History:          Patient has prior history of Echocardiogram examinations, most                  recent 08/11/2023. HFrEF and CHF, CAD, Chronic Kidney Disease;                  Risk Factors:Diabetes, Dyslipidemia and Hypertension.  Sonographer:     Koleen Popper RDCS Referring Phys:  [id number] LEONTINE SAILOR LOCKWOOD Diagnosing Phys: Annabella Scarce MD PROCEDURE: After discussion of the risks and benefits of a TEE, an informed consent was obtained from the patient. The transesophogeal probe was passed without difficulty through the esophogus of the patient. Imaged were obtained with the patient in a left lateral decubitus position. Sedation performed by different physician. The patient was monitored while under deep sedation. Anesthestetic  sedation was provided intravenously by Anesthesiology: 60mg  of Propofol , 20mg  of Lidocaine . The patient's vital  signs; including heart rate, blood pressure, and oxygen saturation; remained stable throughout the procedure. The patient developed no complications during the procedure.  IMPRESSIONS  1. Left ventricular ejection fraction, by estimation, is 30 to 35%. The left ventricle has moderately decreased function. The left ventricle demonstrates global hypokinesis.  2. Right ventricular systolic function is normal. The right ventricular size is normal.  3. No left atrial/left atrial appendage thrombus was detected.  4. The mitral valve is normal in structure. Mild mitral valve regurgitation. No evidence of mitral stenosis. There is mild prolapse of the middle scallop of the posterior leaflet of the mitral valve.  5. The aortic valve is tricuspid. Aortic valve regurgitation is trivial. No aortic stenosis is present.  6. There is mild (Grade II) protruding plaque involving the aortic root. Conclusion(s)/Recommendation(s): No evidence of vegetation/infective endocarditis on this transesophageael echocardiogram. FINDINGS  Left Ventricle: Left ventricular ejection fraction, by estimation, is 30 to 35%. The  left ventricle has moderately decreased function. The left ventricle demonstrates global hypokinesis. The left ventricular internal cavity size was normal in size. There is no left ventricular hypertrophy. Right Ventricle: The right ventricular size is normal. No increase in right ventricular wall thickness. Right ventricular systolic function is normal. Left Atrium: Left atrial size was normal in size. No left atrial/left atrial appendage thrombus was detected. Right Atrium: Right atrial size was normal in size. Pericardium: There is no evidence of pericardial effusion. Mitral Valve: The mitral valve is normal in structure. There is mild prolapse of the middle scallop of the posterior leaflet of the mitral valve. Mild mitral valve regurgitation. No evidence of mitral valve stenosis. Tricuspid Valve: The tricuspid valve is normal in structure. Tricuspid valve regurgitation is mild . No evidence of tricuspid stenosis. Aortic Valve: The aortic valve is tricuspid. Aortic valve regurgitation is trivial. No aortic stenosis is present. Pulmonic Valve: The pulmonic valve was normal in structure. Pulmonic valve regurgitation is not visualized. No evidence of pulmonic stenosis. Aorta: The aortic root is normal in size and structure. There is mild (Grade II) protruding plaque involving the aortic root. IAS/Shunts: No atrial level shunt detected by color flow Doppler. Additional Comments: Spectral Doppler performed. LEFT VENTRICLE PLAX 2D LVOT diam:     2.00 cm LVOT Area:     3.14 cm   AORTA Ao Root diam: 3.10 cm Ao Asc diam:  3.50 cm TRICUSPID VALVE TR Peak grad:   14.3 mmHg TR Vmax:        189.00 cm/s  SHUNTS Systemic Diam: 2.00 cm Annabella Scarce MD Electronically signed by Annabella Scarce MD Signature Date/Time: 12/08/2023/3:58:52 PM    Final    EP STUDY Result Date: 12/08/2023 See surgical note for result.     Assessment/Plan:  INTERVAL HISTORY: TEE is clean   Principal Problem:   Streptococcal  bacteremia Active Problems:   Depression, major, single episode, complete remission (HCC)   History of breast cancer   GERD (gastroesophageal reflux disease)   BPH (benign prostatic hyperplasia)   Type 2 diabetes mellitus with hyperglycemia, with long-term current use of insulin  (HCC)   Cognitive impairment   Right middle cerebral artery stroke (HCC)   Hypotension   Iron deficiency anemia   Bladder outlet obstruction   HFrEF (heart failure with reduced ejection fraction) (HCC)   Sepsis (HCC)   CAD S/P percutaneous coronary angioplasty   Altered mental status   Nausea vomiting and  diarrhea   Pacemaker    Hugh Kamara is a 78 y.o. 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  Will see him back in the clinic and check surveillance cultures.   Prentice Debby Cera has an appointment on 01/09/2024 at 4PM with Dr. Fleeta Rothman at  [facility], which  is located in the [facility] at  [address] [place] in [place].  [address], which is located to the left of the elevators.  Phone: [phone]  Fax: [phone]  [email or web address]  The patient should arrive 30 minutes prior to their appoitment.   I have personally spent 52 minutes involved in face-to-face and non-face-to-face activities for this patient on the day of the visit. Professional time spent includes the following activities: Preparing to see the patient (review of tests), Obtaining and/or reviewing separately obtained history (admission/discharge record), Performing a medically appropriate examination and/or evaluation , Ordering medications/tests/procedures, referring and communicating with other health care professionals, Documenting clinical information in the EMR,  Independently interpreting results (not separately reported), Communicating results to the patient/family/caregiver, Counseling and educating the patient/family/caregiver and Care coordination (not separately reported).   Evaluation of the patient requires complex antimicrobial therapy evaluation, counseling , isolation needs to reduce disease transmission and risk assessment and mitigation.   I will sign off for now, please call with further quesitons.   LOS: 5 days   Jomarie Fleeta Rothman 12/09/2023, 1:27 PM

## 2023-12-09 NOTE — Inpatient Diabetes Management (Addendum)
 Inpatient Diabetes Program Recommendations  AACE/ADA: New Consensus Statement on Inpatient Glycemic Control (2025)  Target Ranges:  Prepandial:   less than 140 mg/dL      Peak postprandial:   less than 180 mg/dL (1-2 hours)      Critically ill patients:  140 - 180 mg/dL   Lab Results  Component Value Date   GLUCAP 208 (H) 12/09/2023   HGBA1C 7.5 (H) 08/06/2023    Review of Glycemic Control  Latest Reference Range & Units 12/08/23 08:06 12/08/23 11:54 12/08/23 16:00 12/08/23 21:16 12/08/23 23:11 12/09/23 08:49  Glucose-Capillary 70 - 99 mg/dL 824 (H) 808 (H) 848 (H) 243 (H) 223 (H) 208 (H)   Diabetes history: DM 2 Outpatient Diabetes medications:  Lantus  15 units daily, Metformin  500 mg bid Current orders for Inpatient glycemic control:  Novolog  0-9 units + hs Lantus  18 units q HS Novolog  2 units tid meal coverage  Inpatient Diabetes Program Recommendations:   Please consider increasing Lantus  to 20 units Daily  Thanks,  Clotilda Bull RN, MSN, BC-ADM Inpatient Diabetes Coordinator Team Pager [phone] (8a-5p)

## 2023-12-09 NOTE — Plan of Care (Signed)

## 2023-12-09 NOTE — Progress Notes (Signed)
 PROGRESS NOTE    John [name]  FMW:[id number] DOB: May 10, 1945 DOA: 12/04/2023 PCP: [name], DO   Brief Narrative:  78 y.o. male with history of breast cancer status postmastectomy and chemotherapy, history of CAD status post DES, history of stroke in January 2025, history of PE completed course of Eliquis  6 months, diabetes mellitus type 2, chronic indwelling Foley catheter was brought to the ER after patient's wife noticed that patient has been in intermittent episodes of confusion over the last 24 hours. Being treated for UTI and bacteremia, ID and cardiology consulted, he needs TEE to be done today.   Assessment & Plan:  Principal Problem:   Streptococcal bacteremia Active Problems:   Hypotension   BPH (benign prostatic hyperplasia)   Depression, major, single episode, complete remission (HCC)   History of breast cancer   GERD (gastroesophageal reflux disease)   Type 2 diabetes mellitus with hyperglycemia, with long-term current use of insulin  (HCC)   Cognitive impairment   Right middle cerebral artery stroke (HCC)   Iron deficiency anemia   Bladder outlet obstruction   HFrEF (heart failure with reduced ejection fraction) (HCC)   Sepsis (HCC)   CAD S/P percutaneous coronary angioplasty   Sepsis likely secondary to Enterococcus faecalis UTI.   Acute metabolic encephalopathy-resolved Patient also did have some diarrhea however currently resolved Continue on ceftriaxone  Continue to monitor culture results Infectious disease consulted     Streptococcus species bacteremia Blood culture from 8/24 showing 1 out of 3 bottles positive for strep species No growth on repeat blood cultures drawn on 8/25. Patient has pacemaker in place ID on board Cardiology consulted for TEE as patient has a pacemaker in place. TEE done on 8/28 showed no evidence of infective endocarditis.   CAD status post DES: denies any chest pain.  Takes antiplatelet agents and statins.      History of cardiomyopathy: with a EF of 30 to 35%, not on GDMT due to hypotensive episodes.   History of CVA: on statins and Plavix .   History of BPH with urinary retention with chronic indwelling Foley catheter last changed about 3 weeks ago. Nursing staff instructed to change patient's Foley if he allows   Diabetes mellitus type 2: recently primary care increased Lantus  insulin  to 15 units.  Last hemoglobin A1c was 7.5.  On sliding scale coverage.   Chronic anemia on iron supplements.   Major moderate Depression: on Lexapro .   GERD on PPI.   History of breast cancer status post mastectomy and chemotherapy being followed by oncologist at [facility].   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   Prior history of provoked PE has completed 6 months of Eliquis .  Disposition: Lives at home with his wife.Patient is currently active with [facility] for RN only. DME at the home includes [facility] , [facility], [facility] and [employer].    DVT prophylaxis: enoxaparin  (LOVENOX ) injection 40 mg Start: 12/04/23 2345     Code Status: Limited: Do not attempt resuscitation (DNR) -DNR-LIMITED -Do Not Intubate/DNI  Family Communication:  None at the bedside Status is: Inpatient Remains inpatient appropriate because: bacteremia, sepsis    Subjective:  No acute events overnight. TEE done yesterday showed no evidence of IE but EF is 30-35%.  Examination:  General exam: Appears calm and comfortable, slow to respond to questions Respiratory system: Clear to auscultation. Respiratory effort normal. Cardiovascular system: S1 & S2 heard, RRR. No JVD, murmurs, rubs, gallops or clicks. No pedal edema. Gastrointestinal system: Abdomen is nondistended, soft and nontender.  No organomegaly or masses felt. Normal bowel sounds heard. Central nervous system: Alert and oriented. No focal neurological deficits. Extremities: Symmetric 5 x 5 power. Skin: No rashes, lesions or ulcers       Diet Orders (From admission,  onward)     Start     Ordered   12/08/23 1525  Diet heart healthy/carb modified Room service appropriate? Yes; Fluid consistency: Thin  Diet effective now       Question Answer Comment  Diet-HS Snack? Nothing   Room service appropriate? Yes   Fluid consistency: Thin      12/08/23 1525            Objective: Vitals:   12/08/23 2108 12/09/23 0113 12/09/23 0449 12/09/23 0845  BP: (!) 177/69 (!) 155/70 (!) 152/68 (!) 157/69  Pulse: 60 60 60 60  Resp:  20 19 20   Temp: 98 F (36.7 C) 98 F (36.7 C) 98 F (36.7 C) 98.8 F (37.1 C)  TempSrc: Oral Oral Oral Oral  SpO2: 97% 98% 98% 97%  Weight:      Height:        Intake/Output Summary (Last 24 hours) at 12/09/2023 0928 Last data filed at 12/09/2023 0645 Gross per 24 hour  Intake 204.28 ml  Output 1475 ml  Net -1270.72 ml   Filed Weights   12/04/23 1821  Weight: 87 kg    Scheduled Meds:  atorvastatin   80 mg Oral QHS   Chlorhexidine  Gluconate Cloth  6 each Topical Daily   clopidogrel   75 mg Oral Q breakfast   cyanocobalamin   1,000 mcg Oral Daily   enoxaparin  (LOVENOX ) injection  40 mg Subcutaneous Q24H   escitalopram   10 mg Oral Daily   ferrous sulfate   325 mg Oral Q breakfast   finasteride   5 mg Oral Daily   insulin  aspart  0-5 Units Subcutaneous QHS   insulin  aspart  0-9 Units Subcutaneous TID WC   insulin  aspart  2 Units Subcutaneous TID WC   insulin  glargine  18 Units Subcutaneous QHS   melatonin  10 mg Oral QHS   midodrine   10 mg Oral TID WC   pantoprazole   40 mg Oral QHS   QUEtiapine   50 mg Oral Once   ziprasidone   20 mg Intramuscular Once   Continuous Infusions:  cefTRIAXone  (ROCEPHIN )  IV Stopped (12/08/23 1628)    Nutritional status     Body mass index is 26.01 kg/m.  Data Reviewed:   CBC: Recent Labs  Lab 12/04/23 1851 12/05/23 0038 12/05/23 0323 12/06/23 1035 12/07/23 0226 12/08/23 0231  WBC 11.1* 9.7 9.6 8.3 7.8 7.8  NEUTROABS 8.6*  --   --   --   --   --   HGB 12.1* 11.0* 11.7*  11.6* 11.2* 11.6*  HCT 37.6* 34.1* 37.2* 35.5* 34.1* 34.8*  MCV 86.8 86.8 88.4 85.3 84.6 85.3  PLT 247 217 209 252 217 213   Basic Metabolic Panel: Recent Labs  Lab 12/04/23 1851 12/05/23 0038 12/05/23 0323 12/06/23 1035 12/07/23 0226 12/08/23 0231  NA 138  --  136 134* 138 140  K 3.7  --  4.0 3.4* 3.2* 3.7  CL 101  --  104 99 101 105  CO2 22  --  22 26 25 26   GLUCOSE 265*  --  222* 274* 229* 230*  BUN 13  --  13 12 11 12   CREATININE 1.07 0.86 0.95 1.00 0.87 0.90  CALCIUM  9.3  --  8.6* 8.9 8.7* 9.0  MG 1.0*  --   --   --   --   --  GFR: Estimated Creatinine Clearance: 75.4 mL/min (by C-G formula based on SCr of 0.9 mg/dL). Liver Function Tests: Recent Labs  Lab 12/04/23 1851 12/05/23 0323  AST 26 21  ALT 22 20  ALKPHOS 56 51  BILITOT 1.3* 1.4*  PROT 6.2* 5.6*  ALBUMIN 3.4* 3.0*   Recent Labs  Lab 12/04/23 1851  LIPASE 31   Recent Labs  Lab 12/04/23 1851  AMMONIA 15   Coagulation Profile: No results for input(s): INR, PROTIME in the last 168 hours. Cardiac Enzymes: No results for input(s): CKTOTAL, CKMB, CKMBINDEX, TROPONINI in the last 168 hours. BNP (last 3 results) No results for input(s): PROBNP in the last 8760 hours. HbA1C: No results for input(s): HGBA1C in the last 72 hours. CBG: Recent Labs  Lab 12/08/23 1154 12/08/23 1600 12/08/23 2116 12/08/23 2311 12/09/23 0849  GLUCAP 191* 151* 243* 223* 208*   Lipid Profile: No results for input(s): CHOL, HDL, LDLCALC, TRIG, CHOLHDL, LDLDIRECT in the last 72 hours. Thyroid  Function Tests: No results for input(s): TSH, T4TOTAL, FREET4, T3FREE, THYROIDAB in the last 72 hours. Anemia Panel: No results for input(s): VITAMINB12, FOLATE, FERRITIN, TIBC, IRON, RETICCTPCT in the last 72 hours. Sepsis Labs: Recent Labs  Lab 12/05/23 0038 12/05/23 0105 12/05/23 0323 12/05/23 0330  LATICACIDVEN 2.0* 1.6 1.6 1.6    Recent Results (from the past 240  hours)  Urine Culture     Status: Abnormal   Collection Time: 12/04/23  6:51 PM   Specimen: Urine, Random  Result Value Ref Range Status   Specimen Description URINE, RANDOM  Final   Special Requests   Final    NONE Reflexed from [id number] Performed at [facility], [address], [place], [place] [address]    Culture 70,000 COLONIES/mL ENTEROCOCCUS FAECALIS (A)  Final   Report Status 12/07/2023 FINAL  Final   Organism ID, Bacteria ENTEROCOCCUS FAECALIS (A)  Final      Susceptibility   Enterococcus faecalis - MIC*    AMPICILLIN <=2 SENSITIVE Sensitive     NITROFURANTOIN <=16 SENSITIVE Sensitive     VANCOMYCIN  1 SENSITIVE Sensitive     * 70,000 COLONIES/mL ENTEROCOCCUS FAECALIS  Blood culture (routine x 2)     Status: Abnormal   Collection Time: 12/04/23  7:42 PM   Specimen: BLOOD RIGHT ARM  Result Value Ref Range Status   Specimen Description BLOOD RIGHT ARM  Final   Special Requests   Final    BOTTLES DRAWN AEROBIC AND ANAEROBIC Blood Culture results may not be optimal due to an inadequate volume of blood received in culture bottles   Culture  Setup Time   Final    GRAM POSITIVE COCCI IN CHAINS IN BOTH AEROBIC AND ANAEROBIC BOTTLES Organism ID to follow CRITICAL RESULT CALLED TO, READ BACK BY AND VERIFIED WITH: LOIS BARTER PHARMD, AT 1119 12/05/23 D. VANHOOK Performed at [facility], [address] [place], [place] [address]    Culture STREPTOCOCCUS SALIVARIUS (A)  Final   Report Status 12/07/2023 FINAL  Final   Organism ID, Bacteria STREPTOCOCCUS SALIVARIUS  Final      Susceptibility   Streptococcus salivarius - MIC*    PENICILLIN 1 INTERMEDIATE Intermediate     CEFTRIAXONE  0.5 SENSITIVE Sensitive     ERYTHROMYCIN 2 RESISTANT Resistant     LEVOFLOXACIN 2 SENSITIVE Sensitive     VANCOMYCIN  0.5 SENSITIVE Sensitive     * STREPTOCOCCUS SALIVARIUS  Blood Culture ID Panel (Reflexed)     Status: Abnormal   Collection  Time: 12/04/23  7:42 PM  Result Value Ref Range  Status   Enterococcus faecalis NOT DETECTED NOT DETECTED Final   Enterococcus Faecium NOT DETECTED NOT DETECTED Final   Listeria monocytogenes NOT DETECTED NOT DETECTED Final   Staphylococcus species NOT DETECTED NOT DETECTED Final   Staphylococcus aureus (BCID) NOT DETECTED NOT DETECTED Final   Staphylococcus epidermidis NOT DETECTED NOT DETECTED Final   Staphylococcus lugdunensis NOT DETECTED NOT DETECTED Final   Streptococcus species DETECTED (A) NOT DETECTED Final    Comment: Not Enterococcus species, Streptococcus agalactiae, Streptococcus pyogenes, or Streptococcus pneumoniae. CRITICAL RESULT CALLED TO, READ BACK BY AND VERIFIED WITH: LOIS BARTER PHARMD, AT 1119 12/05/23 D. VANHOOK    Streptococcus agalactiae NOT DETECTED NOT DETECTED Final   Streptococcus pneumoniae NOT DETECTED NOT DETECTED Final   Streptococcus pyogenes NOT DETECTED NOT DETECTED Final   A.calcoaceticus-baumannii NOT DETECTED NOT DETECTED Final   Bacteroides fragilis NOT DETECTED NOT DETECTED Final   Enterobacterales NOT DETECTED NOT DETECTED Final   Enterobacter cloacae complex NOT DETECTED NOT DETECTED Final   Escherichia coli NOT DETECTED NOT DETECTED Final   Klebsiella aerogenes NOT DETECTED NOT DETECTED Final   Klebsiella oxytoca NOT DETECTED NOT DETECTED Final   Klebsiella pneumoniae NOT DETECTED NOT DETECTED Final   Proteus species NOT DETECTED NOT DETECTED Final   Salmonella species NOT DETECTED NOT DETECTED Final   Serratia marcescens NOT DETECTED NOT DETECTED Final   Haemophilus influenzae NOT DETECTED NOT DETECTED Final   Neisseria meningitidis NOT DETECTED NOT DETECTED Final   Pseudomonas aeruginosa NOT DETECTED NOT DETECTED Final   Stenotrophomonas maltophilia NOT DETECTED NOT DETECTED Final   Candida albicans NOT DETECTED NOT DETECTED Final   Candida auris NOT DETECTED NOT DETECTED Final   Candida glabrata NOT DETECTED NOT DETECTED Final   Candida krusei NOT DETECTED NOT DETECTED Final   Candida  parapsilosis NOT DETECTED NOT DETECTED Final   Candida tropicalis NOT DETECTED NOT DETECTED Final   Cryptococcus neoformans/gattii NOT DETECTED NOT DETECTED Final    Comment: Performed at [facility], [address], [place], [place] [address]  Resp panel by RT-PCR (RSV, Flu A&B, Covid) Anterior Nasal Swab     Status: None   Collection Time: 12/04/23  8:40 PM   Specimen: Anterior Nasal Swab  Result Value Ref Range Status   SARS Coronavirus 2 by RT PCR NEGATIVE NEGATIVE Final   Influenza A by PCR NEGATIVE NEGATIVE Final   Influenza B by PCR NEGATIVE NEGATIVE Final    Comment: (NOTE) The Xpert Xpress SARS-CoV-2/FLU/RSV plus assay is intended as an aid in the diagnosis of influenza from Nasopharyngeal swab specimens and should not be used as a sole basis for treatment. Nasal washings and aspirates are unacceptable for Xpert Xpress SARS-CoV-2/FLU/RSV testing.  Fact Sheet for Patients: BloggerCourse.com  Fact Sheet for Healthcare Providers: SeriousBroker.it  This test is not yet approved or cleared by the [place]  FDA and has been authorized for detection and/or diagnosis of SARS-CoV-2 by FDA under an Emergency Use Authorization (EUA). This EUA will remain in effect (meaning this test can be used) for the duration of the COVID-19 declaration under Section 564(b)(1) of the Act, 21 U.S.C. section 360bbb-3(b)(1), unless the authorization is terminated or revoked.     Resp Syncytial Virus by PCR NEGATIVE NEGATIVE Final    Comment: (NOTE) Fact Sheet for Patients: BloggerCourse.com  Fact Sheet for Healthcare Providers: SeriousBroker.it  This test is not yet approved or cleared by the [place]  FDA and has  been authorized for detection and/or diagnosis of SARS-CoV-2 by FDA under an Emergency Use Authorization (EUA). This EUA will remain in effect (meaning this test can be  used) for the duration of the COVID-19 declaration under Section 564(b)(1) of the Act, 21 U.S.C. section 360bbb-3(b)(1), unless the authorization is terminated or revoked.  Performed at [facility], [address] [place], [place] [address]   Blood culture (routine x 2)     Status: None (Preliminary result)   Collection Time: 12/05/23  3:23 AM   Specimen: BLOOD  Result Value Ref Range Status   Specimen Description BLOOD BLOOD LEFT ARM  Final   Special Requests   Final    BOTTLES DRAWN AEROBIC ONLY Blood Culture adequate volume   Culture   Final    NO GROWTH 4 DAYS Performed at [facility], [address], [place], [place] [address]    Report Status PENDING  Incomplete         Radiology Studies: ECHO TEE Result Date: 12/08/2023    TRANSESOPHOGEAL ECHO REPORT   Patient Name:   DASHEL GOINES [place] Date of Exam: 12/08/2023 Medical Rec #:  [id number]        Height:       72.0 in Accession #:    [id number]       Weight:       191.8 lb Date of Birth:  [birth year]        BSA:          2.093 m Patient Age:    78 years         BP:           167/82 mmHg Patient Gender: M                HR:           60 bpm. Exam Location:  Inpatient Procedure: Transesophageal Echo, Cardiac Doppler and Color Doppler (Both            Spectral and Color Flow Doppler were utilized during procedure). Indications:     Endocarditis  History:         Patient has prior history of Echocardiogram examinations, most                  recent 08/11/2023. HFrEF and CHF, CAD, Chronic Kidney Disease;                  Risk Factors:Diabetes, Dyslipidemia and Hypertension.  Sonographer:     Koleen Popper RDCS Referring Phys:  [id number] LEONTINE SAILOR LOCKWOOD Diagnosing Phys: Annabella Scarce MD PROCEDURE: After discussion of the risks and benefits of a TEE, an informed consent was obtained from the patient. The transesophogeal probe was passed without difficulty through the esophogus of the patient. Imaged were obtained with the patient  in a left lateral decubitus position. Sedation performed by different physician. The patient was monitored while under deep sedation. Anesthestetic sedation was provided intravenously by Anesthesiology: 60mg  of Propofol , 20mg  of Lidocaine . The patient's vital  signs; including heart rate, blood pressure, and oxygen saturation; remained stable throughout the procedure. The patient developed no complications during the procedure.  IMPRESSIONS  1. Left ventricular ejection fraction, by estimation, is 30 to 35%. The left ventricle has moderately decreased function. The left ventricle demonstrates global hypokinesis.  2. Right ventricular systolic function is normal. The right ventricular size is normal.  3. No left atrial/left atrial appendage thrombus was detected.  4. The mitral valve  is normal in structure. Mild mitral valve regurgitation. No evidence of mitral stenosis. There is mild prolapse of the middle scallop of the posterior leaflet of the mitral valve.  5. The aortic valve is tricuspid. Aortic valve regurgitation is trivial. No aortic stenosis is present.  6. There is mild (Grade II) protruding plaque involving the aortic root. Conclusion(s)/Recommendation(s): No evidence of vegetation/infective endocarditis on this transesophageael echocardiogram. FINDINGS  Left Ventricle: Left ventricular ejection fraction, by estimation, is 30 to 35%. The left ventricle has moderately decreased function. The left ventricle demonstrates global hypokinesis. The left ventricular internal cavity size was normal in size. There is no left ventricular hypertrophy. Right Ventricle: The right ventricular size is normal. No increase in right ventricular wall thickness. Right ventricular systolic function is normal. Left [facility]: Left atrial size was normal in size. No left atrial/left atrial appendage thrombus was detected. Right [facility]: Right atrial size was normal in size. Pericardium: There is no evidence of pericardial effusion.  Mitral Valve: The mitral valve is normal in structure. There is mild prolapse of the middle scallop of the posterior leaflet of the mitral valve. Mild mitral valve regurgitation. No evidence of mitral valve stenosis. Tricuspid Valve: The tricuspid valve is normal in structure. Tricuspid valve regurgitation is mild . No evidence of tricuspid stenosis. Aortic Valve: The aortic valve is tricuspid. Aortic valve regurgitation is trivial. No aortic stenosis is present. Pulmonic Valve: The pulmonic valve was normal in structure. Pulmonic valve regurgitation is not visualized. No evidence of pulmonic stenosis. Aorta: The aortic root is normal in size and structure. There is mild (Grade II) protruding plaque involving the aortic root. IAS/Shunts: No atrial level shunt detected by color flow Doppler. Additional Comments: Spectral Doppler performed. LEFT VENTRICLE PLAX 2D LVOT diam:     2.00 cm LVOT Area:     3.14 cm   AORTA Ao Root diam: 3.10 cm Ao Asc diam:  3.50 cm TRICUSPID VALVE TR Peak grad:   14.3 mmHg TR Vmax:        189.00 cm/s  SHUNTS Systemic Diam: 2.00 cm Annabella Scarce MD Electronically signed by Annabella Scarce MD Signature Date/Time: 12/08/2023/3:58:52 PM    Final    EP STUDY Result Date: 12/08/2023 See surgical note for result.       LOS: 5 days   Time spent= 41 mins    Deliliah Room, MD [facility]  If 7PM-7AM, please contact night-coverage  12/09/2023, 9:28 AM

## 2023-12-09 NOTE — Care Management Important Message (Signed)
 Important Message  Patient Details  Name: John [name] MRN: [id number] Date of Birth: 11/22/1945   Important Message Given:  Yes - Medicare IM     Claretta Deed 12/09/2023, 4:14 PM

## 2023-12-10 DIAGNOSIS — R7881 Bacteremia: Secondary | ICD-10-CM | POA: Diagnosis not present

## 2023-12-10 DIAGNOSIS — B955 Unspecified streptococcus as the cause of diseases classified elsewhere: Secondary | ICD-10-CM | POA: Diagnosis not present

## 2023-12-10 LAB — CULTURE, BLOOD (ROUTINE X 2)
Culture: NO GROWTH
Special Requests: ADEQUATE

## 2023-12-10 LAB — GLUCOSE, CAPILLARY: Glucose-Capillary: 222 mg/dL — ABNORMAL HIGH (ref 70–99)

## 2023-12-10 NOTE — Discharge Summary (Signed)
 Physician Discharge Summary   Patient: John [name] MRN: [id number] DOB: 01-Jun-1945  Admit date:     12/04/2023  Discharge date: 12/10/23  Discharge Physician: John [name]   PCP: John Mabel Mt, DO   Recommendations at discharge:    Follow up with PCP in one week Follow up with ID Dr John [name] on 9/29 at 4 pm Continue taking meds as prescribed Return to ED if you develop chest pain, fever or shortness of breath.  Discharge Diagnoses: Principal Problem:   Streptococcal bacteremia Active Problems:   Hypotension   BPH (benign prostatic hyperplasia)   Depression, major, single episode, complete remission (HCC)   History of breast cancer   GERD (gastroesophageal reflux disease)   Type 2 diabetes mellitus with hyperglycemia, with long-term current use of insulin  (HCC)   Cognitive impairment   Right middle cerebral artery stroke (HCC)   Iron deficiency anemia   Bladder outlet obstruction   HFrEF (heart failure with reduced ejection fraction) (HCC)   Sepsis (HCC)   CAD S/P percutaneous coronary angioplasty   Altered mental status   Nausea vomiting and diarrhea   Pacemaker   Hospital Course:  Sepsis likely secondary to Enterococcus faecalis UTI.   Acute metabolic encephalopathy-resolved Patient also did have some diarrhea however currently resolved Continue on ceftriaxone  Continue to monitor culture results Infectious disease consulted     Streptococcus species bacteremia Blood culture from 8/24 showing 1 out of 3 bottles positive for strep species No growth on repeat blood cultures drawn on 8/25. Patient has pacemaker in place ID on board Cardiology was consulted for TEE as patient has a pacemaker in place. TEE done on 8/28 showed no evidence of infective endocarditis. Dced on oral zyvox  for 9 days (total 14 days of antibiotics) Out patient follow up with ID DR John [name] on 9/29 at 4 pm.   CAD status post DES: denies any chest pain.  Takes antiplatelet  agents and statins.     History of cardiomyopathy: with a EF of 30 to 35%, not on GDMT due to hypotensive episodes.   History of CVA: on statins and Plavix .   History of BPH with urinary retention with chronic indwelling Foley catheter last changed about 3 weeks ago.    Diabetes mellitus type 2: Continue home regimen   Chronic anemia on iron supplements.   Major moderate Depression: on Lexapro .   GERD on PPI.   History of breast cancer status post mastectomy and chemotherapy being followed by oncologist at [facility].   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   Prior history of provoked PE has completed 6 months of Eliquis .   Disposition: Lives at home with his wife.Patient is currently active with [facility] for RN only. DME at the home includes [name] , RW, [facility] and [employer].       Consultants: ID, cardiology Procedures performed: TEE  Disposition: Home Diet recommendation:  Cardiac and Carb modified diet DISCHARGE MEDICATION: Allergies as of 12/10/2023       Reactions   Ramipril Anaphylaxis   Adhesive [tape] Rash   Other Diarrhea   Severe intolerance to Chemotherapy in the past.   Sertraline Other (See Comments)   Extreme headaches        Medication List     TAKE these medications    acetaminophen  650 MG CR tablet Commonly known as: TYLENOL  Take 1 tablet (650 mg total) by mouth every 8 (eight) hours as needed for pain.   atorvastatin  80 MG tablet Commonly known as:  LIPITOR  Take 1 tablet (80 mg total) by mouth at bedtime.   clopidogrel  75 MG tablet Commonly known as: PLAVIX  Take 1 tablet (75 mg total) by mouth daily with breakfast.   clotrimazole -betamethasone  cream Commonly known as: LOTRISONE  Apply 1 Application topically daily. What changed:  when to take this reasons to take this   cyanocobalamin  1000 MCG tablet Commonly known as: VITAMIN B12 Take 1 tablet (1,000 mcg total) by mouth daily.   escitalopram  10 MG tablet Commonly known  as: Lexapro  Take 1 tablet (10 mg total) by mouth daily.   FeroSul 325 (65 Fe) MG tablet Generic drug: ferrous sulfate  Take 1 tablet (325 mg total) by mouth daily with breakfast.   finasteride  5 MG tablet Commonly known as: PROSCAR  Take 1 tablet (5 mg total) by mouth daily. *Need appointment for future refills.*   fluconazole  100 MG tablet Commonly known as: DIFLUCAN  Take 1 tablet (100 mg total) by mouth daily.   hyoscyamine  0.125 MG SL tablet Commonly known as: LEVSIN  SL Place 1 tablet (0.125 mg total) under the tongue every 4 (four) hours as needed for bladder spasms.   insulin  glargine 100 UNIT/ML injection Commonly known as: LANTUS  Inject 0.15 mLs (15 Units total) into the skin daily for 7 days.   linezolid  600 MG tablet Commonly known as: ZYVOX  Take 1 tablet (600 mg total) by mouth 2 (two) times daily for 9 days.   loperamide  2 MG capsule Commonly known as: IMODIUM  Take 1 capsule (2 mg total) by mouth as needed for diarrhea or loose stools.   metFORMIN  500 MG tablet Commonly known as: GLUCOPHAGE  Take 1 tablet (500 mg total) by mouth 2 (two) times daily with a meal.   midodrine  10 MG tablet Commonly known as: PROAMATINE  Take 1 tablet (10 mg total) by mouth 3 (three) times daily with meals. *Need appointment for future refills.*   multivitamin tablet Take 1 tablet by mouth 2 (two) times a week.   pantoprazole  40 MG tablet Commonly known as: PROTONIX  Take 1 tablet (40 mg total) by mouth at bedtime.   vitamin C 1000 MG tablet Take 1,000 mg by mouth 3 (three) times a week.        Follow-up Information     John Mabel Mt, DO. Schedule an appointment as soon as possible for a visit in 1 week(s).   Specialty: Family Medicine Contact information: [address] [place] [address] [phone]         John [name], John SAILOR, MD Follow up on 01/09/2024.   Specialty: Infectious Diseases Why: 01/09/2024 at 4PM with Dr. Fleeta [name] Contact  information: [address] [place] [address] [phone]                Discharge Exam: John [name]   12/04/23 1821  Weight: 87 kg   General exam: Appears calm and comfortable, slow to respond to questions Respiratory system: Clear to auscultation. Respiratory effort normal. Cardiovascular system: S1 & S2 heard, RRR. No JVD, murmurs, rubs, gallops or clicks. No pedal edema. Gastrointestinal system: Abdomen is nondistended, soft and nontender. No organomegaly or masses felt. Normal bowel sounds heard. Central nervous system: Alert and oriented. No focal neurological deficits. Extremities: Symmetric 5 x 5 power. Skin: No rashes, lesions or ulcers  Condition at discharge: good  The results of significant diagnostics from this hospitalization (including imaging, microbiology, ancillary and laboratory) are listed below for reference.   Imaging Studies: ECHO TEE Result Date: 12/08/2023  TRANSESOPHOGEAL ECHO REPORT   Patient Name:   TAUREAN JU Hartt Date of Exam: 12/08/2023 Medical Rec #:  [id number]        Height:       72.0 in Accession #:    [id number]       Weight:       191.8 lb Date of Birth:  [birth year]        BSA:          2.093 m Patient Age:    78 years         BP:           167/82 mmHg Patient Gender: M                HR:           60 bpm. Exam Location:  Inpatient Procedure: Transesophageal Echo, Cardiac Doppler and Color Doppler (Both            Spectral and Color Flow Doppler were utilized during procedure). Indications:     Endocarditis  History:         Patient has prior history of Echocardiogram examinations, most                  recent 08/11/2023. HFrEF and CHF, CAD, Chronic Kidney Disease;                  Risk Factors:Diabetes, Dyslipidemia and Hypertension.  Sonographer:     Koleen Popper RDCS Referring Phys:  [id number] LEONTINE [name] LOCKWOOD Diagnosing Phys: Annabella Scarce MD PROCEDURE: After discussion of the risks and benefits of a TEE, an informed consent  was obtained from the patient. The transesophogeal probe was passed without difficulty through the esophogus of the patient. Imaged were obtained with the patient in a left lateral decubitus position. Sedation performed by different physician. The patient was monitored while under deep sedation. Anesthestetic sedation was provided intravenously by Anesthesiology: 60mg  of Propofol , 20mg  of Lidocaine . The patient's vital  signs; including heart rate, blood pressure, and oxygen saturation; remained stable throughout the procedure. The patient developed no complications during the procedure.  IMPRESSIONS  1. Left ventricular ejection fraction, by estimation, is 30 to 35%. The left ventricle has moderately decreased function. The left ventricle demonstrates global hypokinesis.  2. Right ventricular systolic function is normal. The right ventricular size is normal.  3. No left atrial/left atrial appendage thrombus was detected.  4. The mitral valve is normal in structure. Mild mitral valve regurgitation. No evidence of mitral stenosis. There is mild prolapse of the middle scallop of the posterior leaflet of the mitral valve.  5. The aortic valve is tricuspid. Aortic valve regurgitation is trivial. No aortic stenosis is present.  6. There is mild (Grade II) protruding plaque involving the aortic root. Conclusion(s)/Recommendation(s): No evidence of vegetation/infective endocarditis on this transesophageael echocardiogram. FINDINGS  Left Ventricle: Left ventricular ejection fraction, by estimation, is 30 to 35%. The left ventricle has moderately decreased function. The left ventricle demonstrates global hypokinesis. The left ventricular internal cavity size was normal in size. There is no left ventricular hypertrophy. Right Ventricle: The right ventricular size is normal. No increase in right ventricular wall thickness. Right ventricular systolic function is normal. Left [facility]: Left atrial size was normal in size. No left  atrial/left atrial appendage thrombus was detected. Right [facility]: Right atrial size was normal in size. Pericardium: There is no evidence of pericardial effusion. Mitral Valve: The mitral valve is normal in structure. There is mild  prolapse of the middle scallop of the posterior leaflet of the mitral valve. Mild mitral valve regurgitation. No evidence of mitral valve stenosis. Tricuspid Valve: The tricuspid valve is normal in structure. Tricuspid valve regurgitation is mild . No evidence of tricuspid stenosis. Aortic Valve: The aortic valve is tricuspid. Aortic valve regurgitation is trivial. No aortic stenosis is present. Pulmonic Valve: The pulmonic valve was normal in structure. Pulmonic valve regurgitation is not visualized. No evidence of pulmonic stenosis. Aorta: The aortic root is normal in size and structure. There is mild (Grade II) protruding plaque involving the aortic root. IAS/Shunts: No atrial level shunt detected by color flow Doppler. Additional Comments: Spectral Doppler performed. LEFT VENTRICLE PLAX 2D LVOT diam:     2.00 cm LVOT Area:     3.14 cm   AORTA Ao Root diam: 3.10 cm Ao Asc diam:  3.50 cm TRICUSPID VALVE TR Peak grad:   14.3 mmHg TR Vmax:        189.00 cm/s  SHUNTS Systemic Diam: 2.00 cm Annabella Scarce MD Electronically signed by Annabella Scarce MD Signature Date/Time: 12/08/2023/3:58:52 PM    Final    EP STUDY Result Date: 12/08/2023 See surgical note for result.  CT Angio Chest PE W and/or Wo Contrast Result Date: 12/04/2023 CLINICAL DATA:  Nausea and vomiting with chest and abdominal pain, initial encounter EXAM: CT ANGIOGRAPHY CHEST CT ABDOMEN AND PELVIS WITH CONTRAST TECHNIQUE: Multidetector CT imaging of the chest was performed using the standard protocol during bolus administration of intravenous contrast. Multiplanar CT image reconstructions and MIPs were obtained to evaluate the vascular anatomy. Multidetector CT imaging of the abdomen and pelvis was performed using the  standard protocol during bolus administration of intravenous contrast. RADIATION DOSE REDUCTION: This exam was performed according to the departmental dose-optimization program which includes automated exposure control, adjustment of the mA and/or kV according to patient size and/or use of iterative reconstruction technique. CONTRAST:  75mL OMNIPAQUE  IOHEXOL  350 MG/ML SOLN COMPARISON:  None Available. FINDINGS: CTA CHEST FINDINGS Cardiovascular: Thoracic aorta shows atherosclerotic calcifications without aneurysmal dilatation or dissection. No cardiac enlargement is seen. Coronary calcifications are noted. The pulmonary artery shows a normal branching pattern bilaterally. No filling defect to suggest pulmonary embolism is noted. Mediastinum/Nodes: Thoracic inlet is within normal limits. No hilar or mediastinal adenopathy is noted. The esophagus as visualized is within normal limits. Lungs/Pleura: Lungs are well aerated bilaterally. No focal infiltrate or sizable effusion is seen. Persistent pleural based density is noted in the anterolateral aspect of the right upper lobe best seen on image number 34 of series 9 stable from the prior exam and likely related to prior radiation therapy. Some scarring is noted in the right lung base. No focal nodule is seen. Musculoskeletal: Degenerative changes of the thoracic spine are noted. Review of the MIP images confirms the above findings. CT ABDOMEN and PELVIS FINDINGS Hepatobiliary: Dependent gallstones are noted. No wall thickening or pericholecystic fluid is noted. The liver is within normal limits. Pancreas: Unremarkable. No pancreatic ductal dilatation or surrounding inflammatory changes. Spleen: Scattered calcified granulomas are noted. Adrenals/Urinary Tract: The adrenal glands again shows some nodular thickening although no discrete lesion is noted. The kidneys demonstrate a normal enhancement pattern bilaterally. No renal calculi or obstructive changes are seen. The  bladder is decompressed by Foley catheter. Stomach/Bowel: Colon is predominately decompressed. No inflammatory changes are seen. The appendix is not well visualized and may have been surgically removed. No inflammatory changes to suggest appendicitis are noted. Stomach and small bowel  are within normal limits. Vascular/Lymphatic: Aortic atherosclerosis. No enlarged abdominal or pelvic lymph nodes. Reproductive: Prostate is unremarkable. Other: No abdominal wall hernia or abnormality. No abdominopelvic ascites. Musculoskeletal: Mild degenerative changes of lumbar spine are noted. Review of the MIP images confirms the above findings. IMPRESSION: CTA of the chest: No evidence of pulmonary embolism. Chronic scarring in the right lung. CT of the abdomen and pelvis: Cholelithiasis without complicating factors. Changes of prior granulomatous disease. No acute abnormality is seen. Electronically Signed   By: Oneil Devonshire M.D.   On: 12/04/2023 21:25   CT ABDOMEN PELVIS W CONTRAST Result Date: 12/04/2023 CLINICAL DATA:  Nausea and vomiting with chest and abdominal pain, initial encounter EXAM: CT ANGIOGRAPHY CHEST CT ABDOMEN AND PELVIS WITH CONTRAST TECHNIQUE: Multidetector CT imaging of the chest was performed using the standard protocol during bolus administration of intravenous contrast. Multiplanar CT image reconstructions and MIPs were obtained to evaluate the vascular anatomy. Multidetector CT imaging of the abdomen and pelvis was performed using the standard protocol during bolus administration of intravenous contrast. RADIATION DOSE REDUCTION: This exam was performed according to the departmental dose-optimization program which includes automated exposure control, adjustment of the mA and/or kV according to patient size and/or use of iterative reconstruction technique. CONTRAST:  75mL OMNIPAQUE  IOHEXOL  350 MG/ML SOLN COMPARISON:  None Available. FINDINGS: CTA CHEST FINDINGS Cardiovascular: Thoracic aorta shows  atherosclerotic calcifications without aneurysmal dilatation or dissection. No cardiac enlargement is seen. Coronary calcifications are noted. The pulmonary artery shows a normal branching pattern bilaterally. No filling defect to suggest pulmonary embolism is noted. Mediastinum/Nodes: Thoracic inlet is within normal limits. No hilar or mediastinal adenopathy is noted. The esophagus as visualized is within normal limits. Lungs/Pleura: Lungs are well aerated bilaterally. No focal infiltrate or sizable effusion is seen. Persistent pleural based density is noted in the anterolateral aspect of the right upper lobe best seen on image number 34 of series 9 stable from the prior exam and likely related to prior radiation therapy. Some scarring is noted in the right lung base. No focal nodule is seen. Musculoskeletal: Degenerative changes of the thoracic spine are noted. Review of the MIP images confirms the above findings. CT ABDOMEN and PELVIS FINDINGS Hepatobiliary: Dependent gallstones are noted. No wall thickening or pericholecystic fluid is noted. The liver is within normal limits. Pancreas: Unremarkable. No pancreatic ductal dilatation or surrounding inflammatory changes. Spleen: Scattered calcified granulomas are noted. Adrenals/Urinary Tract: The adrenal glands again shows some nodular thickening although no discrete lesion is noted. The kidneys demonstrate a normal enhancement pattern bilaterally. No renal calculi or obstructive changes are seen. The bladder is decompressed by Foley catheter. Stomach/Bowel: Colon is predominately decompressed. No inflammatory changes are seen. The appendix is not well visualized and may have been surgically removed. No inflammatory changes to suggest appendicitis are noted. Stomach and small bowel are within normal limits. Vascular/Lymphatic: Aortic atherosclerosis. No enlarged abdominal or pelvic lymph nodes. Reproductive: Prostate is unremarkable. Other: No abdominal wall hernia  or abnormality. No abdominopelvic ascites. Musculoskeletal: Mild degenerative changes of lumbar spine are noted. Review of the MIP images confirms the above findings. IMPRESSION: CTA of the chest: No evidence of pulmonary embolism. Chronic scarring in the right lung. CT of the abdomen and pelvis: Cholelithiasis without complicating factors. Changes of prior granulomatous disease. No acute abnormality is seen. Electronically Signed   By: Oneil Devonshire M.D.   On: 12/04/2023 21:25   CT Head Wo Contrast Result Date: 12/04/2023 CLINICAL DATA:  Delirium EXAM: CT HEAD  WITHOUT CONTRAST TECHNIQUE: Contiguous axial images were obtained from the base of the skull through the vertex without intravenous contrast. RADIATION DOSE REDUCTION: This exam was performed according to the departmental dose-optimization program which includes automated exposure control, adjustment of the mA and/or kV according to patient size and/or use of iterative reconstruction technique. COMPARISON:  CT head 10/26/2023 FINDINGS: Brain: Cerebral ventricle sizes are concordant with the degree of cerebral volume loss. Patchy and confluent areas of decreased attenuation are noted throughout the deep and periventricular white matter of the cerebral hemispheres bilaterally, compatible with chronic microvascular ischemic disease. No evidence of large-territorial acute infarction. No parenchymal hemorrhage. No mass lesion. No extra-axial collection. No mass effect or midline shift. No hydrocephalus. Basilar cisterns are patent. Vascular: No hyperdense vessel. Atherosclerotic calcifications are present within the cavernous internal carotid arteries. Skull: No acute fracture or focal lesion. Sinuses/Orbits: Paranasal sinuses and mastoid air cells are clear. The orbits are unremarkable. Other: None. IMPRESSION: No acute intracranial abnormality. Electronically Signed   By: Morgane  Naveau M.D.   On: 12/04/2023 21:21   DG Chest Port 1 View Result Date:  12/04/2023 CLINICAL DATA:  Vomiting. 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 The heart size and mediastinal contours are within normal limits. Mild calcification of the aortic arch is noted. Both lungs are clear. The visualized skeletal structures are unremarkable. IMPRESSION: No active disease. Electronically Signed   By: Suzen Dials M.D.   On: 12/04/2023 20:05    Microbiology: Results for orders placed or performed during the hospital encounter of 12/04/23  Urine Culture     Status: Abnormal   Collection Time: 12/04/23  6:51 PM   Specimen: Urine, Random  Result Value Ref Range Status   Specimen Description URINE, RANDOM  Final   Special Requests   Final    NONE Reflexed from [id number] Performed at [facility], [address], [place], [place] [address]    Culture 70,000 COLONIES/mL ENTEROCOCCUS FAECALIS (A)  Final   Report Status 12/07/2023 FINAL  Final   Organism ID, Bacteria ENTEROCOCCUS FAECALIS (A)  Final      Susceptibility   Enterococcus faecalis - MIC*    AMPICILLIN <=2 SENSITIVE Sensitive     NITROFURANTOIN <=16 SENSITIVE Sensitive     VANCOMYCIN  1 SENSITIVE Sensitive     * 70,000 COLONIES/mL ENTEROCOCCUS FAECALIS  Blood culture (routine x 2)     Status: Abnormal   Collection Time: 12/04/23  7:42 PM   Specimen: BLOOD RIGHT ARM  Result Value Ref Range Status   Specimen Description BLOOD RIGHT ARM  Final   Special Requests   Final    BOTTLES DRAWN AEROBIC AND ANAEROBIC Blood Culture results may not be optimal due to an inadequate volume of blood received in culture bottles   Culture  Setup Time   Final    GRAM POSITIVE COCCI IN CHAINS IN BOTH AEROBIC AND ANAEROBIC BOTTLES Organism ID to follow CRITICAL RESULT CALLED TO, READ BACK BY AND VERIFIED WITH: LOIS BARTER PHARMD, AT 1119 12/05/23 D. VANHOOK Performed at [facility], [address], [place], [place] [address]    Culture STREPTOCOCCUS  SALIVARIUS (A)  Final   Report Status 12/07/2023 FINAL  Final   Organism ID, Bacteria STREPTOCOCCUS SALIVARIUS  Final      Susceptibility   Streptococcus salivarius - MIC*    PENICILLIN 1 INTERMEDIATE Intermediate     CEFTRIAXONE  0.5 SENSITIVE Sensitive  ERYTHROMYCIN 2 RESISTANT Resistant     LEVOFLOXACIN 2 SENSITIVE Sensitive     VANCOMYCIN  0.5 SENSITIVE Sensitive     * STREPTOCOCCUS SALIVARIUS  Blood Culture ID Panel (Reflexed)     Status: Abnormal   Collection Time: 12/04/23  7:42 PM  Result Value Ref Range Status   Enterococcus faecalis NOT DETECTED NOT DETECTED Final   Enterococcus Faecium NOT DETECTED NOT DETECTED Final   Listeria monocytogenes NOT DETECTED NOT DETECTED Final   Staphylococcus species NOT DETECTED NOT DETECTED Final   Staphylococcus aureus (BCID) NOT DETECTED NOT DETECTED Final   Staphylococcus epidermidis NOT DETECTED NOT DETECTED Final   Staphylococcus lugdunensis NOT DETECTED NOT DETECTED Final   Streptococcus species DETECTED (A) NOT DETECTED Final    Comment: Not Enterococcus species, Streptococcus agalactiae, Streptococcus pyogenes, or Streptococcus pneumoniae. CRITICAL RESULT CALLED TO, READ BACK BY AND VERIFIED WITH: LOIS BARTER PHARMD, AT 1119 12/05/23 D. VANHOOK    Streptococcus agalactiae NOT DETECTED NOT DETECTED Final   Streptococcus pneumoniae NOT DETECTED NOT DETECTED Final   Streptococcus pyogenes NOT DETECTED NOT DETECTED Final   A.calcoaceticus-baumannii NOT DETECTED NOT DETECTED Final   Bacteroides fragilis NOT DETECTED NOT DETECTED Final   Enterobacterales NOT DETECTED NOT DETECTED Final   Enterobacter cloacae complex NOT DETECTED NOT DETECTED Final   Escherichia coli NOT DETECTED NOT DETECTED Final   Klebsiella aerogenes NOT DETECTED NOT DETECTED Final   Klebsiella oxytoca NOT DETECTED NOT DETECTED Final   Klebsiella pneumoniae NOT DETECTED NOT DETECTED Final   Proteus species NOT DETECTED NOT DETECTED Final   Salmonella species NOT  DETECTED NOT DETECTED Final   Serratia marcescens NOT DETECTED NOT DETECTED Final   Haemophilus influenzae NOT DETECTED NOT DETECTED Final   Neisseria meningitidis NOT DETECTED NOT DETECTED Final   Pseudomonas aeruginosa NOT DETECTED NOT DETECTED Final   Stenotrophomonas maltophilia NOT DETECTED NOT DETECTED Final   Candida albicans NOT DETECTED NOT DETECTED Final   Candida auris NOT DETECTED NOT DETECTED Final   Candida glabrata NOT DETECTED NOT DETECTED Final   Candida krusei NOT DETECTED NOT DETECTED Final   Candida parapsilosis NOT DETECTED NOT DETECTED Final   Candida tropicalis NOT DETECTED NOT DETECTED Final   Cryptococcus neoformans/gattii NOT DETECTED NOT DETECTED Final    Comment: Performed at [facility], [address] [place], [place] [address]  Resp panel by RT-PCR (RSV, Flu A&B, Covid) Anterior Nasal Swab     Status: None   Collection Time: 12/04/23  8:40 PM   Specimen: Anterior Nasal Swab  Result Value Ref Range Status   SARS Coronavirus 2 by RT PCR NEGATIVE NEGATIVE Final   Influenza A by PCR NEGATIVE NEGATIVE Final   Influenza B by PCR NEGATIVE NEGATIVE Final    Comment: (NOTE) The Xpert Xpress SARS-CoV-2/FLU/RSV plus assay is intended as an aid in the diagnosis of influenza from Nasopharyngeal swab specimens and should not be used as a sole basis for treatment. Nasal washings and aspirates are unacceptable for Xpert Xpress SARS-CoV-2/FLU/RSV testing.  Fact Sheet for Patients: BloggerCourse.com  Fact Sheet for Healthcare Providers: SeriousBroker.it  This test is not yet approved or cleared by the [place]  FDA and has been authorized for detection and/or diagnosis of SARS-CoV-2 by FDA under an Emergency Use Authorization (EUA). This EUA will remain in effect (meaning this test can be used) for the duration of the COVID-19 declaration under Section 564(b)(1) of the Act, 21 U.S.C. section  360bbb-3(b)(1), unless the authorization is terminated or revoked.  Resp Syncytial Virus by PCR NEGATIVE NEGATIVE Final    Comment: (NOTE) Fact Sheet for Patients: BloggerCourse.com  Fact Sheet for Healthcare Providers: SeriousBroker.it  This test is not yet approved or cleared by the [place]  FDA and has been authorized for detection and/or diagnosis of SARS-CoV-2 by FDA under an Emergency Use Authorization (EUA). This EUA will remain in effect (meaning this test can be used) for the duration of the COVID-19 declaration under Section 564(b)(1) of the Act, 21 U.S.C. section 360bbb-3(b)(1), unless the authorization is terminated or revoked.  Performed at [facility], [address], [place], [place] [address]   Blood culture (routine x 2)     Status: None   Collection Time: 12/05/23  3:23 AM   Specimen: BLOOD  Result Value Ref Range Status   Specimen Description BLOOD BLOOD LEFT ARM  Final   Special Requests   Final    BOTTLES DRAWN AEROBIC ONLY Blood Culture adequate volume   Culture   Final    NO GROWTH 5 DAYS Performed at [facility], [address], [place], [place] [address]    Report Status 12/10/2023 FINAL  Final    Labs: CBC: Recent Labs  Lab 12/04/23 1851 12/05/23 0038 12/05/23 0323 12/06/23 1035 12/07/23 0226 12/08/23 0231  WBC 11.1* 9.7 9.6 8.3 7.8 7.8  NEUTROABS 8.6*  --   --   --   --   --   HGB 12.1* 11.0* 11.7* 11.6* 11.2* 11.6*  HCT 37.6* 34.1* 37.2* 35.5* 34.1* 34.8*  MCV 86.8 86.8 88.4 85.3 84.6 85.3  PLT 247 217 209 252 217 213   Basic Metabolic Panel: Recent Labs  Lab 12/04/23 1851 12/05/23 0038 12/05/23 0323 12/06/23 1035 12/07/23 0226 12/08/23 0231  NA 138  --  136 134* 138 140  K 3.7  --  4.0 3.4* 3.2* 3.7  CL 101  --  104 99 101 105  CO2 22  --  22 26 25 26   GLUCOSE 265*  --  222* 274* 229* 230*  BUN 13  --  13 12 11 12   CREATININE 1.07 0.86 0.95 1.00 0.87  0.90  CALCIUM  9.3  --  8.6* 8.9 8.7* 9.0  MG 1.0*  --   --   --   --   --    Liver Function Tests: Recent Labs  Lab 12/04/23 1851 12/05/23 0323  AST 26 21  ALT 22 20  ALKPHOS 56 51  BILITOT 1.3* 1.4*  PROT 6.2* 5.6*  ALBUMIN 3.4* 3.0*   CBG: Recent Labs  Lab 12/09/23 0849 12/09/23 1213 12/09/23 1725 12/09/23 2019 12/10/23 0811  GLUCAP 208* 170* 277* 271* 222*    Discharge time spent: 43 minutes.  Signed: Deliliah Room, MD [facility] 12/10/2023

## 2023-12-10 NOTE — TOC Transition Note (Signed)
 Transition of Care [facility]) - Discharge Note   Patient Details  Name: John [name] MRN: [id number] Date of Birth: Oct 05, 1945  [facility] CM/SW Contact:  Marval Gell, RN Phone Number: 12/10/2023, 9:34 AM   Clinical Narrative:     Patient active w [facility], notified liaison of DC.   Final next level of care: Home w Home Health Services Barriers to Discharge: No Barriers Identified   Patient Goals and CMS Choice            Discharge Placement                       Discharge Plan and Services Additional resources added to the After Visit Summary for                              [facility] Agency: [facility] Date [facility] Agency Contacted: 12/10/23 Time HH Agency Contacted: [phone] Representative spoke with at [facility] Agency: [facility]  Social Drivers of Health (SDOH) Interventions SDOH Screenings   Food Insecurity: No Food Insecurity (12/05/2023)  Housing: Low Risk  (12/05/2023)  Transportation Needs: No Transportation Needs (12/05/2023)  Utilities: Not At Risk (12/05/2023)  Alcohol Screen: Low Risk  (01/27/2023)  Depression (PHQ2-9): Low Risk  (01/28/2023)  Financial Resource Strain: Low Risk  (10/03/2023)  Physical Activity: Inactive (10/03/2023)  Social Connections: Socially Isolated (12/05/2023)  Stress: No Stress Concern Present (10/03/2023)  Tobacco Use: Low Risk  (12/08/2023)     Readmission Risk Interventions    12/06/2023    4:03 PM 05/11/2023   12:52 PM 04/22/2023   12:12 PM  Readmission Risk Prevention Plan  Medication Screening   Complete  Transportation Screening Complete Complete Complete  PCP or Specialist Appt within 3-5 Days  Complete   HRI or Home Care Consult  Complete   Social Work Consult for Recovery Care Planning/Counseling  Complete   Palliative Care Screening  Not Applicable   Medication Review Oceanographer) Complete Complete   HRI or Home Care Consult Complete    SW Recovery Care/Counseling Consult  Complete    Palliative Care Screening Not Applicable    Skilled Nursing Facility Not Applicable

## 2023-12-12 DIAGNOSIS — Z794 Long term (current) use of insulin: Secondary | ICD-10-CM | POA: Diagnosis not present

## 2023-12-12 DIAGNOSIS — I951 Orthostatic hypotension: Secondary | ICD-10-CM | POA: Diagnosis not present

## 2023-12-12 DIAGNOSIS — E1140 Type 2 diabetes mellitus with diabetic neuropathy, unspecified: Secondary | ICD-10-CM | POA: Diagnosis not present

## 2023-12-12 DIAGNOSIS — I2510 Atherosclerotic heart disease of native coronary artery without angina pectoris: Secondary | ICD-10-CM | POA: Diagnosis not present

## 2023-12-12 DIAGNOSIS — I110 Hypertensive heart disease with heart failure: Secondary | ICD-10-CM | POA: Diagnosis not present

## 2023-12-12 DIAGNOSIS — I69354 Hemiplegia and hemiparesis following cerebral infarction affecting left non-dominant side: Secondary | ICD-10-CM | POA: Diagnosis not present

## 2023-12-12 DIAGNOSIS — Z466 Encounter for fitting and adjustment of urinary device: Secondary | ICD-10-CM | POA: Diagnosis not present

## 2023-12-12 DIAGNOSIS — R338 Other retention of urine: Secondary | ICD-10-CM | POA: Diagnosis not present

## 2023-12-12 DIAGNOSIS — Z86711 Personal history of pulmonary embolism: Secondary | ICD-10-CM | POA: Diagnosis not present

## 2023-12-12 DIAGNOSIS — K219 Gastro-esophageal reflux disease without esophagitis: Secondary | ICD-10-CM | POA: Diagnosis not present

## 2023-12-12 DIAGNOSIS — I429 Cardiomyopathy, unspecified: Secondary | ICD-10-CM | POA: Diagnosis not present

## 2023-12-12 DIAGNOSIS — E1165 Type 2 diabetes mellitus with hyperglycemia: Secondary | ICD-10-CM | POA: Diagnosis not present

## 2023-12-12 DIAGNOSIS — D509 Iron deficiency anemia, unspecified: Secondary | ICD-10-CM | POA: Diagnosis not present

## 2023-12-12 DIAGNOSIS — G5600 Carpal tunnel syndrome, unspecified upper limb: Secondary | ICD-10-CM | POA: Diagnosis not present

## 2023-12-12 DIAGNOSIS — A409 Streptococcal sepsis, unspecified: Secondary | ICD-10-CM | POA: Diagnosis not present

## 2023-12-12 DIAGNOSIS — Z9861 Coronary angioplasty status: Secondary | ICD-10-CM | POA: Diagnosis not present

## 2023-12-12 DIAGNOSIS — I252 Old myocardial infarction: Secondary | ICD-10-CM | POA: Diagnosis not present

## 2023-12-12 DIAGNOSIS — Z950 Presence of cardiac pacemaker: Secondary | ICD-10-CM | POA: Diagnosis not present

## 2023-12-12 DIAGNOSIS — N401 Enlarged prostate with lower urinary tract symptoms: Secondary | ICD-10-CM | POA: Diagnosis not present

## 2023-12-12 DIAGNOSIS — Z853 Personal history of malignant neoplasm of breast: Secondary | ICD-10-CM | POA: Diagnosis not present

## 2023-12-12 DIAGNOSIS — Z556 Problems related to health literacy: Secondary | ICD-10-CM | POA: Diagnosis not present

## 2023-12-12 DIAGNOSIS — F325 Major depressive disorder, single episode, in full remission: Secondary | ICD-10-CM | POA: Diagnosis not present

## 2023-12-12 DIAGNOSIS — I5020 Unspecified systolic (congestive) heart failure: Secondary | ICD-10-CM | POA: Diagnosis not present

## 2023-12-12 DIAGNOSIS — N390 Urinary tract infection, site not specified: Secondary | ICD-10-CM | POA: Diagnosis not present

## 2023-12-12 DIAGNOSIS — N320 Bladder-neck obstruction: Secondary | ICD-10-CM | POA: Diagnosis not present

## 2023-12-13 ENCOUNTER — Ambulatory Visit: Payer: Self-pay | Admitting: Family Medicine

## 2023-12-13 ENCOUNTER — Emergency Department (HOSPITAL_COMMUNITY)

## 2023-12-13 ENCOUNTER — Observation Stay (HOSPITAL_COMMUNITY)
Admission: EM | Admit: 2023-12-13 | Discharge: 2023-12-15 | Disposition: A | Discharge status: Home Health | Attending: Internal Medicine | Admitting: Internal Medicine

## 2023-12-13 ENCOUNTER — Other Ambulatory Visit: Payer: Self-pay

## 2023-12-13 ENCOUNTER — Telehealth: Payer: Self-pay

## 2023-12-13 ENCOUNTER — Encounter (HOSPITAL_COMMUNITY): Payer: Self-pay

## 2023-12-13 DIAGNOSIS — Z8673 Personal history of transient ischemic attack (TIA), and cerebral infarction without residual deficits: Secondary | ICD-10-CM | POA: Insufficient documentation

## 2023-12-13 DIAGNOSIS — I700 Atherosclerosis of aorta: Secondary | ICD-10-CM | POA: Diagnosis not present

## 2023-12-13 DIAGNOSIS — Z853 Personal history of malignant neoplasm of breast: Secondary | ICD-10-CM | POA: Insufficient documentation

## 2023-12-13 DIAGNOSIS — R4182 Altered mental status, unspecified: Principal | ICD-10-CM | POA: Insufficient documentation

## 2023-12-13 DIAGNOSIS — I10 Essential (primary) hypertension: Secondary | ICD-10-CM | POA: Diagnosis not present

## 2023-12-13 DIAGNOSIS — Z7902 Long term (current) use of antithrombotics/antiplatelets: Secondary | ICD-10-CM | POA: Diagnosis not present

## 2023-12-13 DIAGNOSIS — B952 Enterococcus as the cause of diseases classified elsewhere: Secondary | ICD-10-CM | POA: Diagnosis not present

## 2023-12-13 DIAGNOSIS — I672 Cerebral atherosclerosis: Secondary | ICD-10-CM | POA: Diagnosis not present

## 2023-12-13 DIAGNOSIS — N390 Urinary tract infection, site not specified: Secondary | ICD-10-CM | POA: Insufficient documentation

## 2023-12-13 DIAGNOSIS — K5900 Constipation, unspecified: Secondary | ICD-10-CM | POA: Insufficient documentation

## 2023-12-13 DIAGNOSIS — K8070 Calculus of gallbladder and bile duct without cholecystitis without obstruction: Secondary | ICD-10-CM | POA: Diagnosis not present

## 2023-12-13 DIAGNOSIS — Z9861 Coronary angioplasty status: Secondary | ICD-10-CM | POA: Diagnosis not present

## 2023-12-13 DIAGNOSIS — I110 Hypertensive heart disease with heart failure: Secondary | ICD-10-CM | POA: Insufficient documentation

## 2023-12-13 DIAGNOSIS — I959 Hypotension, unspecified: Secondary | ICD-10-CM | POA: Insufficient documentation

## 2023-12-13 DIAGNOSIS — I5020 Unspecified systolic (congestive) heart failure: Secondary | ICD-10-CM | POA: Diagnosis not present

## 2023-12-13 DIAGNOSIS — Z7984 Long term (current) use of oral hypoglycemic drugs: Secondary | ICD-10-CM | POA: Insufficient documentation

## 2023-12-13 DIAGNOSIS — R4189 Other symptoms and signs involving cognitive functions and awareness: Secondary | ICD-10-CM | POA: Diagnosis present

## 2023-12-13 DIAGNOSIS — Z79899 Other long term (current) drug therapy: Secondary | ICD-10-CM | POA: Insufficient documentation

## 2023-12-13 DIAGNOSIS — I2510 Atherosclerotic heart disease of native coronary artery without angina pectoris: Secondary | ICD-10-CM | POA: Insufficient documentation

## 2023-12-13 DIAGNOSIS — T83011A Breakdown (mechanical) of indwelling urethral catheter, initial encounter: Secondary | ICD-10-CM | POA: Insufficient documentation

## 2023-12-13 DIAGNOSIS — E119 Type 2 diabetes mellitus without complications: Secondary | ICD-10-CM

## 2023-12-13 DIAGNOSIS — R410 Disorientation, unspecified: Principal | ICD-10-CM

## 2023-12-13 DIAGNOSIS — Z794 Long term (current) use of insulin: Secondary | ICD-10-CM | POA: Insufficient documentation

## 2023-12-13 DIAGNOSIS — E1140 Type 2 diabetes mellitus with diabetic neuropathy, unspecified: Secondary | ICD-10-CM | POA: Diagnosis not present

## 2023-12-13 DIAGNOSIS — K828 Other specified diseases of gallbladder: Secondary | ICD-10-CM | POA: Diagnosis not present

## 2023-12-13 DIAGNOSIS — R188 Other ascites: Secondary | ICD-10-CM | POA: Diagnosis not present

## 2023-12-13 DIAGNOSIS — K8689 Other specified diseases of pancreas: Secondary | ICD-10-CM | POA: Diagnosis not present

## 2023-12-13 DIAGNOSIS — R112 Nausea with vomiting, unspecified: Secondary | ICD-10-CM | POA: Diagnosis not present

## 2023-12-13 LAB — CBC
HCT: 37.8 % — ABNORMAL LOW (ref 39.0–52.0)
Hemoglobin: 12.4 g/dL — ABNORMAL LOW (ref 13.0–17.0)
MCH: 28.2 pg (ref 26.0–34.0)
MCHC: 32.8 g/dL (ref 30.0–36.0)
MCV: 86.1 fL (ref 80.0–100.0)
Platelets: 273 K/uL (ref 150–400)
RBC: 4.39 MIL/uL (ref 4.22–5.81)
RDW: 18.7 % — ABNORMAL HIGH (ref 11.5–15.5)
WBC: 10.5 K/uL (ref 4.0–10.5)
nRBC: 0 % (ref 0.0–0.2)

## 2023-12-13 LAB — CBC WITH DIFFERENTIAL/PLATELET
Abs Immature Granulocytes: 0.05 K/uL (ref 0.00–0.07)
Basophils Absolute: 0.1 K/uL (ref 0.0–0.1)
Basophils Relative: 1 %
Eosinophils Absolute: 0.1 K/uL (ref 0.0–0.5)
Eosinophils Relative: 1 %
HCT: 40.2 % (ref 39.0–52.0)
Hemoglobin: 12.9 g/dL — ABNORMAL LOW (ref 13.0–17.0)
Immature Granulocytes: 1 %
Lymphocytes Relative: 21 %
Lymphs Abs: 1.9 K/uL (ref 0.7–4.0)
MCH: 28.1 pg (ref 26.0–34.0)
MCHC: 32.1 g/dL (ref 30.0–36.0)
MCV: 87.6 fL (ref 80.0–100.0)
Monocytes Absolute: 0.6 K/uL (ref 0.1–1.0)
Monocytes Relative: 7 %
Neutro Abs: 6.4 K/uL (ref 1.7–7.7)
Neutrophils Relative %: 69 %
Platelets: 279 K/uL (ref 150–400)
RBC: 4.59 MIL/uL (ref 4.22–5.81)
RDW: 18.9 % — ABNORMAL HIGH (ref 11.5–15.5)
WBC: 9.1 K/uL (ref 4.0–10.5)
nRBC: 0 % (ref 0.0–0.2)

## 2023-12-13 LAB — I-STAT CHEM 8, ED
BUN: 17 mg/dL (ref 8–23)
Calcium, Ion: 1.2 mmol/L (ref 1.15–1.40)
Chloride: 100 mmol/L (ref 98–111)
Creatinine, Ser: 0.9 mg/dL (ref 0.61–1.24)
Glucose, Bld: 117 mg/dL — ABNORMAL HIGH (ref 70–99)
HCT: 41 % (ref 39.0–52.0)
Hemoglobin: 13.9 g/dL (ref 13.0–17.0)
Potassium: 3.9 mmol/L (ref 3.5–5.1)
Sodium: 138 mmol/L (ref 135–145)
TCO2: 27 mmol/L (ref 22–32)

## 2023-12-13 LAB — COMPREHENSIVE METABOLIC PANEL WITH GFR
ALT: 21 U/L (ref 0–44)
AST: 21 U/L (ref 15–41)
Albumin: 3.9 g/dL (ref 3.5–5.0)
Alkaline Phosphatase: 70 U/L (ref 38–126)
Anion gap: 13 (ref 5–15)
BUN: 14 mg/dL (ref 8–23)
CO2: 25 mmol/L (ref 22–32)
Calcium: 9.9 mg/dL (ref 8.9–10.3)
Chloride: 101 mmol/L (ref 98–111)
Creatinine, Ser: 0.85 mg/dL (ref 0.61–1.24)
GFR, Estimated: >60 mL/min (ref >60)
Glucose, Bld: 118 mg/dL — ABNORMAL HIGH (ref 70–99)
Potassium: 4 mmol/L (ref 3.5–5.1)
Sodium: 139 mmol/L (ref 135–145)
Total Bilirubin: 2.4 mg/dL — ABNORMAL HIGH (ref 0.0–1.2)
Total Protein: 6.1 g/dL — ABNORMAL LOW (ref 6.5–8.1)

## 2023-12-13 LAB — URINALYSIS, W/ REFLEX TO CULTURE (INFECTION SUSPECTED)
Bacteria, UA: NONE SEEN
Bilirubin Urine: NEGATIVE
Glucose, UA: NEGATIVE mg/dL
Hgb urine dipstick: NEGATIVE
Ketones, ur: NEGATIVE mg/dL
Nitrite: NEGATIVE
Protein, ur: NEGATIVE mg/dL
Specific Gravity, Urine: 1.023 (ref 1.005–1.030)
pH: 7 (ref 5.0–8.0)

## 2023-12-13 LAB — CREATININE, SERUM
Creatinine, Ser: 0.9 mg/dL (ref 0.61–1.24)
GFR, Estimated: >60 mL/min (ref >60)

## 2023-12-13 LAB — LACTIC ACID, PLASMA: Lactic Acid, Venous: 2.7 mmol/L (ref 0.5–1.9)

## 2023-12-13 LAB — AMMONIA: Ammonia: <13 umol/L (ref 9–35)

## 2023-12-13 LAB — GLUCOSE, CAPILLARY: Glucose-Capillary: 136 mg/dL — ABNORMAL HIGH (ref 70–99)

## 2023-12-13 LAB — LIPASE, BLOOD: Lipase: 18 U/L (ref 11–51)

## 2023-12-13 MED ORDER — ACETAMINOPHEN 650 MG RE SUPP: 650.0000 mg | Freq: Four times a day (QID) | RECTAL | Status: DC | PRN

## 2023-12-13 MED ORDER — ESCITALOPRAM OXALATE 10 MG PO TABS
10.0000 mg | ORAL_TABLET | Freq: Every day | ORAL | Status: DC
  Administered 2023-12-14 – 2023-12-15 (×2): 10 mg via ORAL
  Filled 2023-12-13 (×2): qty 1

## 2023-12-13 MED ORDER — ONDANSETRON HCL 4 MG PO TABS: 4.0000 mg | ORAL_TABLET | Freq: Four times a day (QID) | ORAL | Status: DC | PRN

## 2023-12-13 MED ORDER — ACETAMINOPHEN 325 MG PO TABS: 650.0000 mg | ORAL_TABLET | Freq: Four times a day (QID) | ORAL | Status: DC | PRN

## 2023-12-13 MED ORDER — INSULIN ASPART 100 UNIT/ML IJ SOLN
0.0000 [IU] | Freq: Three times a day (TID) | INTRAMUSCULAR | Status: DC
  Administered 2023-12-14: 2 [IU] via SUBCUTANEOUS
  Administered 2023-12-14 – 2023-12-15 (×3): 1 [IU] via SUBCUTANEOUS
  Filled 2023-12-13: qty 0.09

## 2023-12-13 MED ORDER — INSULIN ASPART 100 UNIT/ML IJ SOLN
0.0000 [IU] | Freq: Every day | INTRAMUSCULAR | Status: DC
  Filled 2023-12-13: qty 0.05

## 2023-12-13 MED ORDER — LACTATED RINGERS IV BOLUS
500.0000 mL | Freq: Once | INTRAVENOUS | Status: AC
  Administered 2023-12-13: 500 mL via INTRAVENOUS

## 2023-12-13 MED ORDER — IOHEXOL 300 MG/ML  SOLN
100.0000 mL | Freq: Once | INTRAMUSCULAR | Status: AC | PRN
  Administered 2023-12-13: 100 mL via INTRAVENOUS

## 2023-12-13 MED ORDER — ENOXAPARIN SODIUM 40 MG/0.4ML IJ SOSY
40.0000 mg | PREFILLED_SYRINGE | INTRAMUSCULAR | Status: DC
  Administered 2023-12-13 – 2023-12-14 (×2): 40 mg via SUBCUTANEOUS
  Filled 2023-12-13 (×2): qty 0.4

## 2023-12-13 MED ORDER — ATORVASTATIN CALCIUM 40 MG PO TABS
80.0000 mg | ORAL_TABLET | Freq: Every day | ORAL | Status: DC
  Administered 2023-12-13 – 2023-12-14 (×2): 80 mg via ORAL
  Filled 2023-12-13 (×2): qty 2

## 2023-12-13 MED ORDER — LINEZOLID 600 MG PO TABS
600.0000 mg | ORAL_TABLET | Freq: Two times a day (BID) | ORAL | Status: DC
  Administered 2023-12-13 – 2023-12-15 (×4): 600 mg via ORAL
  Filled 2023-12-13 (×5): qty 1

## 2023-12-13 MED ORDER — ONDANSETRON HCL 4 MG/2ML IJ SOLN: 4.0000 mg | Freq: Four times a day (QID) | INTRAMUSCULAR | Status: DC | PRN

## 2023-12-13 NOTE — ED Triage Notes (Signed)
 Pt bib EMS from home. Pt was at [facility] dc Sunday after sepsis. Yesterday vomiting. Today constipated x1 wk possibly. Refused meds for constipation while [facility]. Tried to put a plate in the toaster so family thinks pt is confused. Pt refused to answer A&O questions. Foley in place. Hx Diabetes  CBG 105  140/62 BP  62 HR  97% RA  Denies pain no vomiting today.

## 2023-12-13 NOTE — Telephone Encounter (Signed)
 FYI Only or Action Required?: Action required by provider: update on patient condition.  Patient was last seen in primary care on 11/11/2023 by [name], DO.  Called Nurse Triage reporting Vomiting.  Symptoms are: unchanged.  Triage Disposition: Go to ED Now (or PCP Triage)  Patient/caregiver understands and will follow disposition?: Yes              Copied from CRM [id number]. Topic: Clinical - Red Word Triage >> Dec 13, 2023  8:34 AM Charlet HERO wrote: Red Word that prompted transfer to Nurse Triage: Patient wife Mrs. Huot is calling about getting an appt for hospital followup she is stating that her husband had septsus  and he is not feeling well has not had bowel movement in a week and is vomiting. Reason for Disposition  [1] MODERATE to SEVERE vomiting (e.g., 3 or more times/day) AND [2] age > 60 years  Answer Assessment - Initial Assessment Questions This RN spoke with pt's wife, Boby. This RN recommends pt's wife takes pt to ED today if he vomits x3 like he did yesterday. Pt is still sleeping. This RN recommends pt wife check his temperature as well when he wakes up. Pt wife agreeable to take him to hospital if needed. This RN told pt to call nurse triage back if needed with further updates on pt's symptoms and further direction once patient is wake. This RN went ahead and scheduled pt a hospital follow up visit with PCP for this Fri, 9/5.   Vomited at least x3 yesterday, once the day before; denies vomiting blood Pt is asleep so not vomit today Has not had a bm since discharged from hospital on Sat  Pt's wife does not feel like they kept him long enough in hospital Pt is weak- had sepsis  Pt had the hiccups all day yesterday A little warm not really feverish Kind of confused. He was that way in the hospital; pt wife states he has been like this for a little while  Protocols used: Vomiting-A-AH

## 2023-12-13 NOTE — ED Notes (Addendum)
 John [name] top sent to the lab

## 2023-12-13 NOTE — Transitions of Care (Post Inpatient/ED Visit) (Signed)
   12/13/2023  Name: John [name] MRN: [id number] DOB: 13-Aug-1945  Today's TOC FU Call Status: Today's TOC FU Call Status:: Unsuccessful Call (1st Attempt) Unsuccessful Call (1st Attempt) Date: 12/13/23  Attempted to reach the patient regarding the most recent Inpatient/ED visit.  Follow Up Plan: Additional outreach attempts will be made to reach the patient to complete the Transitions of Care (Post Inpatient/ED visit) call.   Medford Balboa, BSN, RN [place]  [facility] - [employer] [facility] RN Care Manager [phone]

## 2023-12-13 NOTE — ED Notes (Signed)
 John [name], and John [name] sent to the lab.

## 2023-12-13 NOTE — H&P (Addendum)
 History and Physical    Patient: John [name] FMW:[id number] DOB: Nov 18, 1945 DOA: 12/13/2023 DOS: the patient was seen and examined on 12/13/2023 PCP: [name], DO  Patient coming from: Home  Chief Complaint:  Chief Complaint  Patient presents with   Constipation   HPI: John [name] is a 78 y.o. male with medical history significant of heart failure with reduced ejection fraction, CAD status post [facility] coronary angioplasty, type 2 diabetes, recent admission for metabolic encephalopathy secondary to UTI, presenting to the emergency department for evaluation of altered mental status and vomiting per documentation.  Patient not cooperating with my exam, and unable to reach wife via telephone. (Call auto forwarded) history is limited. Per ED documentation, the wife admits patient does have some memory issues from time to time, but he is far from his baseline  Patient was hospitalized from 12/04/2023 to 12/10/2023 for acute metabolic encephalopathy, sepsis secondary to E faecalis UTI, Streptococcus bacteremia.  He was discharged home on 9 days of Zyvox  and wife reports he has been taking them as prescribed, except for today because he refused.  She does not believe he ever returned to his baseline mental status following last hospitalization  In the emergency department, he was hypertensive, otherwise workup unremarkable.  He has not vomited and has a benign abdominal exam.  He did have a CT of his head which showed stable atrophy and small vessel ischemia.  CT abdomen pelvis was also unremarkable.  On my exam he was alert, however answered no to every question asked.  Will admit to observation  Review of Systems: Review of Systems  Unable to perform ROS: Mental acuity       Past Medical History:  Diagnosis Date   BPH (benign prostatic hyperplasia)    CTS (carpal tunnel syndrome)    Depression    Diabetes mellitus    Essential hypertension    GERD (gastroesophageal  reflux disease)    History of breast cancer    Hypercholesteremia    Neuropathy    Stroke [facility])    Past Surgical History:  Procedure Laterality Date   CATARACT EXTRACTION, BILATERAL  01/18/2017   CORONARY STENT INTERVENTION N/A 08/10/2023   Procedure: CORONARY STENT INTERVENTION;  Surgeon: Darron Deatrice LABOR, MD;  Location: [facility] INVASIVE CV LAB;  Service: Cardiovascular;  Laterality: N/A;   LEFT HEART CATH AND CORONARY ANGIOGRAPHY N/A 08/10/2023   Procedure: LEFT HEART CATH AND CORONARY ANGIOGRAPHY;  Surgeon: Darron Deatrice LABOR, MD;  Location: [facility] INVASIVE CV LAB;  Service: Cardiovascular;  Laterality: N/A;   LITHOTRIPSY     MASTECTOMY Right 05/02/2018   PACEMAKER IMPLANT N/A 05/13/2023   Procedure: PACEMAKER IMPLANT;  Surgeon: Kennyth Chew, MD;  Location: [facility] INVASIVE CV LAB;  Service: Cardiovascular;  Laterality: N/A;   TEMPORARY PACEMAKER N/A 05/11/2023   Procedure: TEMPORARY PACEMAKER;  Surgeon: [place]az[place], Peter M, MD;  Location: [facility] INVASIVE CV LAB;  Service: Cardiovascular;  Laterality: N/A;   TONSILLECTOMY AND ADENOIDECTOMY     TRANSESOPHAGEAL ECHOCARDIOGRAM (CATH LAB) N/A 12/08/2023   Procedure: TRANSESOPHAGEAL ECHOCARDIOGRAM;  Surgeon: Raford Riggs, MD;  Location: [facility] INVASIVE CV LAB;  Service: Cardiovascular;  Laterality: N/A;   Social History:  reports that he has never smoked. He has never used smokeless tobacco. He reports that he does not drink alcohol and does not use drugs.  Allergies  Allergen Reactions   Ramipril Anaphylaxis   Adhesive [Tape] Rash   Other Diarrhea    Severe intolerance to Chemotherapy in the past.  Sertraline Other (See Comments)    Extreme headaches    Family History  Problem Relation Age of Onset   Lymphoma Mother    Cancer Mother    Diabetes Father    Stroke Father    Ovarian cancer Sister    Cancer Sister    Cancer Paternal Grandmother     Prior to Admission medications   Medication Sig Start Date End Date Taking? Authorizing Provider   acetaminophen  (TYLENOL ) 650 MG CR tablet Take 1 tablet (650 mg total) by mouth every 8 (eight) hours as needed for pain. 05/14/23   Zhao, Xika, NP  Ascorbic Acid  (VITAMIN C) 1000 MG tablet Take 1,000 mg by mouth 3 (three) times a week.    [provider]  atorvastatin  (LIPITOR ) 80 MG tablet Take 1 tablet (80 mg total) by mouth at bedtime. 09/14/23   [name], DO  clopidogrel  (PLAVIX ) 75 MG tablet Take 1 tablet (75 mg total) by mouth daily with breakfast. 10/11/23   [name], DO  clotrimazole -betamethasone  (LOTRISONE ) cream Apply 1 Application topically daily. Patient taking differently: Apply 1 Application topically daily as needed (rash). 07/11/23   [name], DO  cyanocobalamin  1000 MCG tablet Take 1 tablet (1,000 mcg total) by mouth daily. 09/14/23   [name], DO  escitalopram  (LEXAPRO ) 10 MG tablet Take 1 tablet (10 mg total) by mouth daily. 10/03/23   [name], DO  ferrous sulfate  (FEROSUL) 325 (65 FE) MG tablet Take 1 tablet (325 mg total) by mouth daily with breakfast. 11/28/23   [name], DO  finasteride  (PROSCAR ) 5 MG tablet Take 1 tablet (5 mg total) by mouth daily. *Need appointment for future refills.* 11/28/23   [name], DO  fluconazole  (DIFLUCAN ) 100 MG tablet Take 1 tablet (100 mg total) by mouth daily. Patient not taking: Reported on 12/05/2023 11/10/23     hyoscyamine  (LEVSIN  SL) 0.125 MG SL tablet Place 1 tablet (0.125 mg total) under the tongue every 4 (four) hours as needed for bladder spasms. 08/17/23   Angiulli, Toribio PARAS, PA-C  insulin  glargine (LANTUS ) 100 UNIT/ML injection Inject 0.15 mLs (15 Units total) into the skin daily for 7 days. 11/11/23 12/05/23  [name], DO  linezolid  (ZYVOX ) 600 MG tablet Take 1 tablet (600 mg total) by mouth 2 (two) times daily for 9 days. 12/09/23 12/18/23  Fleeta Kathie Jomarie LOISE, MD  loperamide  (IMODIUM ) 2 MG capsule Take 1 capsule (2 mg  total) by mouth as needed for diarrhea or loose stools. 08/18/23   Love, Sharlet RAMAN, PA-C  metFORMIN  (GLUCOPHAGE ) 500 MG tablet Take 1 tablet (500 mg total) by mouth 2 (two) times daily with a meal. 09/14/23   Wendling, Mabel Mt, DO  midodrine  (PROAMATINE ) 10 MG tablet Take 1 tablet (10 mg total) by mouth 3 (three) times daily with meals. *Need appointment for future refills.* 09/30/23   Meng, Hao, PA  Multiple Vitamin (MULTIVITAMIN) tablet Take 1 tablet by mouth 2 (two) times a week.    [provider]  pantoprazole  (PROTONIX ) 40 MG tablet Take 1 tablet (40 mg total) by mouth at bedtime. 11/14/23   [name], DO  amLODipine  (NORVASC ) 2.5 MG tablet Take 1 tablet (2.5 mg total) by mouth daily. 06/15/23 06/30/23  [name], DO  apixaban  (ELIQUIS ) 5 MG TABS tablet Take 1 tablet (5 mg total) by mouth 2 (two) times daily. 10/11/23 10/11/23  [name], DO    Physical Exam: Vitals:  12/13/23 1018 12/13/23 1230 12/13/23 1315 12/13/23 1639  BP:   (!) 152/88 (!) 191/70  Pulse:   61 64  Resp:   15 15  Temp:  98.6 F (37 C)  97.7 F (36.5 C)  TempSrc:  Rectal  Oral  SpO2:   98% 100%  Weight: 87 kg     Height: 6' (1.829 m)      Physical Activity: Inactive (10/03/2023)   Exercise Vital Sign    Days of Exercise per Week: 0 days    Minutes of Exercise per Session: Not on file  Physical Exam HENT:     Head: Normocephalic and atraumatic.     Mouth/Throat:     Mouth: Mucous membranes are moist.  Eyes:     Extraocular Movements: Extraocular movements intact.  Cardiovascular:     Rate and Rhythm: Normal rate and regular rhythm.  Pulmonary:     Effort: Pulmonary effort is normal. No respiratory distress.     Breath sounds: Normal breath sounds. No wheezing.  Abdominal:     General: Abdomen is flat. Bowel sounds are normal. There is no distension.     Palpations: Abdomen is soft.     Tenderness: There is no abdominal tenderness. There is no right CVA  tenderness or left CVA tenderness.     Comments: Foley catheter in place draining clear yellow urine    Musculoskeletal:     Cervical back: Neck supple.     Right lower leg: No edema.     Left lower leg: No edema.  Skin:    Capillary Refill: Capillary refill takes less than 2 seconds.  Neurological:     Mental Status: He is alert. He is disoriented.     Motor: Motor function is intact.  Psychiatric:     Comments: Unable to assess         Labs on Admission: I have personally reviewed following labs and imaging studies  CBC: Recent Labs  Lab 12/07/23 0226 12/08/23 0231 12/13/23 1222 12/13/23 1229  WBC 7.8 7.8 9.1  --   NEUTROABS  --   --  6.4  --   HGB 11.2* 11.6* 12.9* 13.9  HCT 34.1* 34.8* 40.2 41.0  MCV 84.6 85.3 87.6  --   PLT 217 213 279  --    Basic Metabolic Panel: Recent Labs  Lab 12/07/23 0226 12/08/23 0231 12/13/23 1222 12/13/23 1229  NA 138 140 139 138  K 3.2* 3.7 4.0 3.9  CL 101 105 101 100  CO2 25 26 25   --   GLUCOSE 229* 230* 118* 117*  BUN 11 12 14 17   CREATININE 0.87 0.90 0.85 0.90  CALCIUM  8.7* 9.0 9.9  --    GFR: Estimated Creatinine Clearance: 75.4 mL/min (by C-G formula based on SCr of 0.9 mg/dL). Liver Function Tests: Recent Labs  Lab 12/13/23 1222  AST 21  ALT 21  ALKPHOS 70  BILITOT 2.4*  PROT 6.1*  ALBUMIN 3.9   Recent Labs  Lab 12/13/23 1222  LIPASE 18   Recent Labs  Lab 12/13/23 1618  AMMONIA <13   Coagulation Profile: No results for input(s): INR, PROTIME in the last 168 hours. Cardiac Enzymes: No results for input(s): CKTOTAL, CKMB, CKMBINDEX, TROPONINI in the last 168 hours. BNP (last 3 results) No results for input(s): PROBNP in the last 8760 hours. HbA1C: No results for input(s): HGBA1C in the last 72 hours. CBG: Recent Labs  Lab 12/09/23 0849 12/09/23 1213 12/09/23 1725 12/09/23 2019 12/10/23 9188  GLUCAP 208* 170* 277* 271* 222*   Lipid Profile: No results for input(s): CHOL,  HDL, LDLCALC, TRIG, CHOLHDL, LDLDIRECT in the last 72 hours. Thyroid  Function Tests: No results for input(s): TSH, T4TOTAL, FREET4, T3FREE, THYROIDAB in the last 72 hours. Anemia Panel: No results for input(s): VITAMINB12, FOLATE, FERRITIN, TIBC, IRON, RETICCTPCT in the last 72 hours. Urine analysis:    Component Value Date/Time   COLORURINE STRAW (A) 12/13/2023 1607   APPEARANCEUR CLEAR 12/13/2023 1607   LABSPEC 1.023 12/13/2023 1607   PHURINE 7.0 12/13/2023 1607   GLUCOSEU NEGATIVE 12/13/2023 1607   HGBUR NEGATIVE 12/13/2023 1607   BILIRUBINUR NEGATIVE 12/13/2023 1607   KETONESUR NEGATIVE 12/13/2023 1607   PROTEINUR NEGATIVE 12/13/2023 1607   NITRITE NEGATIVE 12/13/2023 1607   LEUKOCYTESUR MODERATE (A) 12/13/2023 1607    Radiological Exams on Admission: CT Head Wo Contrast Result Date: 12/13/2023 CLINICAL DATA:  Mental status change, unknown cause EXAM: CT HEAD WITHOUT CONTRAST TECHNIQUE: Contiguous axial images were obtained from the base of the skull through the vertex without intravenous contrast. RADIATION DOSE REDUCTION: This exam was performed according to the departmental dose-optimization program which includes automated exposure control, adjustment of the mA and/or kV according to patient size and/or use of iterative reconstruction technique. COMPARISON:  Head CT 12/04/2023 FINDINGS: Brain: No intracranial hemorrhage, mass effect, or midline shift. Stable degree of atrophy and chronic small vessel ischemia. Chronic lacunar infarcts in the basal ganglia. No hydrocephalus. The basilar cisterns are patent. No evidence of territorial infarct or acute ischemia. No extra-axial or intracranial fluid collection. Vascular: Atherosclerosis of skullbase vasculature without hyperdense vessel or abnormal calcification. Skull: No fracture or focal lesion. Sinuses/Orbits: No acute findings.  Bilateral cataract resection Other: None. IMPRESSION: 1. No acute  intracranial abnormality. 2. 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 Electronically Signed   By: Andrea Gasman M.D.   On: 12/13/2023 15:37   CT ABDOMEN PELVIS W CONTRAST Result Date: 12/13/2023 EXAM: CT ABDOMEN AND PELVIS WITH CONTRAST 12/13/2023 02:39:23 PM TECHNIQUE: CT of the abdomen and pelvis was performed with the administration of intravenous contrast. Multiplanar reformatted images are provided for review. Automated exposure control, iterative reconstruction, and/or weight-based adjustment of the mA/kV was utilized to reduce the radiation dose to as low as reasonably achievable. COMPARISON: 12/04/2023 CLINICAL HISTORY: Bowel obstruction suspected. Pt bib EMS from home. Pt was at [facility] dc Sunday after sepsis. Yesterday vomiting. Today constipated x1 wk possibly. Refused meds for constipation while at [facility]. Tried to put a plate in the toaster so family thinks pt is confused. Pt refused to answer ; A\T\O questions. Foley in place. Hx Diabetes. FINDINGS: LOWER CHEST: Trace bilateral pleural effusions. Transvenous pacing leads partially visualized, grossly stable. LIVER: Trace perihepatic fluid. GALLBLADDER AND BILE DUCTS: Gallbladder distended, with several layering partially calcified stones measuring up to 1.3 cm. No definite wall thickening or regional inflammatory change. No biliary ductal dilatation. SPLEEN: Normal size with scattered calcified punctate granulomas. PANCREAS: Diffuse pancreatic parenchymal atrophy without ductal dilatation, mass, or regional inflammatory change. ADRENAL GLANDS: Normal appearance. No mass. KIDNEYS, URETERS AND BLADDER: Urinary bladder decompressed by Foley catheter, appears diffusely thick walled. No stones in the kidneys or ureters. No hydronephrosis. No perinephric or periureteral stranding. GI AND BOWEL: Stomach demonstrates no acute abnormality. There is no bowel obstruction. No bowel wall thickening. PERITONEUM AND RETROPERITONEUM: No free air. VASCULATURE:  Scattered calcified aortoiliac atheromatous plaque without aneurysm or stenosis. LYMPH NODES: No lymphadenopathy. REPRODUCTIVE ORGANS: Prostate enlargement with scattered coarse calcifications. BONES AND SOFT  TISSUES: Bilateral hip degenerative joint disease. Bilateral pelvic phleboliths. No acute osseous abnormality. No focal soft tissue abnormality. IMPRESSION: 1. No evidence of bowel obstruction or other acute findings . 2. Cholelithiasis Electronically signed by: Katheleen Faes MD 12/13/2023 03:29 PM EDT RP Workstation: [id number]   DG Chest Portable 1 View Result Date: 12/13/2023 CLINICAL DATA:  Altered mental status EXAM: PORTABLE CHEST 1 VIEW COMPARISON:  12/04/2023 FINDINGS: The patient is rotated to the left on today's radiograph, reducing diagnostic sensitivity and specificity. Dual lead pacer noted. Atherosclerotic calcification of the aortic arch. Heart size currently within normal limits. The lungs appear clear.  Mild thoracic spondylosis. IMPRESSION: 1. No acute findings. 2. Aortic Atherosclerosis (ICD10-I70.0). Electronically Signed   By: Ryan Salvage M.D.   On: 12/13/2023 12:23       Assessment and Plan: No notes have been filed under this hospital service. Service: Hospitalist   78 y.o. male with medical history significant of heart failure with reduced ejection fraction, CAD status post [facility] coronary angioplasty, type 2 diabetes, recent admission for metabolic encephalopathy secondary to UTI, presenting to the emergency department for evaluation of altered mental status and reported vomiting.   Patient was hospitalized from 12/04/2023 to 12/10/2023 for acute metabolic encephalopathy, sepsis secondary to E faecalis UTI, Streptococcus bacteremia.  He was discharged home on 9 days of Zyvox  and wife reports he has been taking them as prescribed, (besides today due to patient refusal) however does not believe he ever returned to his baseline mental status.  Workup so far unrevealing,  including negative UA.  Patient delirious without focal deficit  Altered mental status Differential includes metabolic encephalopathy, worsening underlying cognitive impairment in the setting of recent illness/hospitalization.  - CT head with stable atrophy and chronic microvascular ischemia - PT/OT  - Continue Lexapro  - delerium precautions   Streptococcal bacteremia E faecalis UTI -Discharged on 8/30 with oral linezolid  to complete 14 days of antibiotics, however wife cannot get him to take it today - TEE negative  - Continue linezolid  - Repeat urine culture pending.  He is not septic  Type 2 diabetes - SSI  Hypotension - On midodrine .  Significantly hypertensive on arrival.  Hold for now and resume as indicated  Chronic indwelling catheter - Per discharge summary on 830/2025 last exchanged first week in August  HFrEF CAD  - appears euvolemic/stable. Not on GDMT given hypotension per 8/30's d/c summary   Lovenox  Consistent carb diet Monitor/replace electrolytes No IVF Attempt to call patient wife unsuccessful  Advance Care Planning:   Code Status: Prior    Severity of Illness: The appropriate patient status for this patient is OBSERVATION. Observation status is judged to be reasonable and necessary in order to provide the required intensity of service to ensure the patient's safety. The patient's presenting symptoms, physical exam findings, and initial radiographic and laboratory data in the context of their medical condition is felt to place them at decreased risk for further clinical deterioration. Furthermore, it is anticipated that the patient will be medically stable for discharge from the hospital within 2 midnights of admission.   Author: Daved JAYSON Pump, DO 12/13/2023 5:40 PM  For on call review [email or web address].

## 2023-12-13 NOTE — ED Notes (Signed)
 Attempted 3 times to clamp off urine for collection. Someone took the clamp off the 1st 2 times. Urine finally collected

## 2023-12-13 NOTE — ED Provider Notes (Signed)
 [facility] Provider Note   CSN: [id number] Arrival date & time: 12/13/23  1002     Patient presents with: Constipation   John [name] is a 78 y.o. male.   HPI 78 year old male presents from home via EMS. Unfortunately history is quite limited as that patient seems altered/doesn't answer many questions and I'm unable to get the wife on the phone (2 attempts).  I did talk to the son over the phone though he lives an hour away and was able to provide only some information.  Patient was recently admitted to the hospital for a UTI.  As far as he knows, the patient has not had a bowel movement in about a week and then had vomiting yesterday multiple times.  He is unsure of what exactly the wife called EMS for today but he is presuming it is for the vomiting.  The patient denies pain when asked but otherwise does not answer certain questions such as why is he here.  Prior to Admission medications   Medication Sig Start Date End Date Taking? Authorizing Provider  acetaminophen  (TYLENOL ) 650 MG CR tablet Take 1 tablet (650 mg total) by mouth every 8 (eight) hours as needed for pain. 05/14/23   Zhao, Xika, NP  Ascorbic Acid  (VITAMIN C) 1000 MG tablet Take 1,000 mg by mouth 3 (three) times a week.    [provider]  atorvastatin  (LIPITOR ) 80 MG tablet Take 1 tablet (80 mg total) by mouth at bedtime. 09/14/23   [name], DO  clopidogrel  (PLAVIX ) 75 MG tablet Take 1 tablet (75 mg total) by mouth daily with breakfast. 10/11/23   Wendling, Mabel Mt, DO  clotrimazole -betamethasone  (LOTRISONE ) cream Apply 1 Application topically daily. Patient taking differently: Apply 1 Application topically daily as needed (rash). 07/11/23   [name], DO  cyanocobalamin  1000 MCG tablet Take 1 tablet (1,000 mcg total) by mouth daily. 09/14/23   [name], DO  escitalopram  (LEXAPRO ) 10 MG tablet Take 1 tablet (10 mg total) by  mouth daily. 10/03/23   [name], DO  ferrous sulfate  (FEROSUL) 325 (65 FE) MG tablet Take 1 tablet (325 mg total) by mouth daily with breakfast. 11/28/23   Frann, Mabel Mt, DO  finasteride  (PROSCAR ) 5 MG tablet Take 1 tablet (5 mg total) by mouth daily. *Need appointment for future refills.* 11/28/23   [name], DO  fluconazole  (DIFLUCAN ) 100 MG tablet Take 1 tablet (100 mg total) by mouth daily. Patient not taking: Reported on 12/05/2023 11/10/23     hyoscyamine  (LEVSIN  SL) 0.125 MG SL tablet Place 1 tablet (0.125 mg total) under the tongue every 4 (four) hours as needed for bladder spasms. 08/17/23   Angiulli, Toribio PARAS, PA-C  insulin  glargine (LANTUS ) 100 UNIT/ML injection Inject 0.15 mLs (15 Units total) into the skin daily for 7 days. 11/11/23 12/05/23  [name], DO  linezolid  (ZYVOX ) 600 MG tablet Take 1 tablet (600 mg total) by mouth 2 (two) times daily for 9 days. 12/09/23 12/18/23  Fleeta Kathie Jomarie LOISE, MD  loperamide  (IMODIUM ) 2 MG capsule Take 1 capsule (2 mg total) by mouth as needed for diarrhea or loose stools. 08/18/23   Love, Sharlet RAMAN, PA-C  metFORMIN  (GLUCOPHAGE ) 500 MG tablet Take 1 tablet (500 mg total) by mouth 2 (two) times daily with a meal. 09/14/23   Wendling, Mabel Mt, DO  midodrine  (PROAMATINE ) 10 MG tablet Take 1 tablet (10 mg  total) by mouth 3 (three) times daily with meals. *Need appointment for future refills.* 09/30/23   Meng, Hao, PA  Multiple Vitamin (MULTIVITAMIN) tablet Take 1 tablet by mouth 2 (two) times a week.    [provider]  pantoprazole  (PROTONIX ) 40 MG tablet Take 1 tablet (40 mg total) by mouth at bedtime. 11/14/23   [name], DO  amLODipine  (NORVASC ) 2.5 MG tablet Take 1 tablet (2.5 mg total) by mouth daily. 06/15/23 06/30/23  [name], DO  apixaban  (ELIQUIS ) 5 MG TABS tablet Take 1 tablet (5 mg total) by mouth 2 (two) times daily. 10/11/23 10/11/23  [name], DO     Allergies: Ramipril, Adhesive [tape], Other, and Sertraline    Review of Systems  Unable to perform ROS: Mental status change    Updated Vital Signs BP (!) 152/88   Pulse 61   Temp 98.6 F (37 C) (Rectal)   Resp 15   Ht 6' (1.829 m)   Wt 87 kg   SpO2 98%   BMI 26.01 kg/m   Physical Exam Vitals and nursing note reviewed.  Constitutional:      Appearance: He is well-developed. He is obese. He is not diaphoretic.  HENT:     Head: Normocephalic and atraumatic.  Cardiovascular:     Rate and Rhythm: Normal rate and regular rhythm.     Heart sounds: Normal heart sounds.  Pulmonary:     Effort: Pulmonary effort is normal.     Breath sounds: Normal breath sounds.  Abdominal:     Palpations: Abdomen is soft.     Tenderness: There is no abdominal tenderness.  Skin:    General: Skin is warm and dry.  Neurological:     Mental Status: He is alert.     Comments: Awake, alert, answers some questions but then just refuses to answer or talk to me when I ask other questions. No facial droop or slurred speech. Equal strength in all 4 extremities.     (all labs ordered are listed, but only abnormal results are displayed) Labs Reviewed  COMPREHENSIVE METABOLIC PANEL WITH GFR - Abnormal; Notable for the following components:      Result Value   Glucose, Bld 118 (*)    Total Protein 6.1 (*)    Total Bilirubin 2.4 (*)    All other components within normal limits  CBC WITH DIFFERENTIAL/PLATELET - Abnormal; Notable for the following components:   Hemoglobin 12.9 (*)    RDW 18.9 (*)    All other components within normal limits  LIPASE, BLOOD  URINALYSIS, W/ REFLEX TO CULTURE (INFECTION SUSPECTED)  AMMONIA  LACTIC ACID, PLASMA  I-STAT CHEM 8, ED    EKG: EKG Interpretation Date/Time:  Tuesday December 13 2023 12:15:59 EDT Ventricular Rate:  66 PR Interval:  197 QRS Duration:  154 QT Interval:  467 QTC Calculation: 490 R Axis:   -77  Text Interpretation: Sinus rhythm  Right bundle branch block Inferior infarct, old Abnormal lateral Q waves no significant change since July 2025 Confirmed by Freddi Hamilton [phone]) on 12/13/2023 1:01:49 PM  Radiology: CT ABDOMEN PELVIS W CONTRAST Result Date: 12/13/2023 EXAM: CT ABDOMEN AND PELVIS WITH CONTRAST 12/13/2023 02:39:23 PM TECHNIQUE: CT of the abdomen and pelvis was performed with the administration of intravenous contrast. Multiplanar reformatted images are provided for review. Automated exposure control, iterative reconstruction, and/or weight-based adjustment of the mA/kV was utilized to reduce the radiation dose to as low as reasonably achievable. COMPARISON: 12/04/2023 CLINICAL HISTORY:  Bowel obstruction suspected. Pt bib EMS from home. Pt was at [facility] dc Sunday after sepsis. Yesterday vomiting. Today constipated x1 wk possibly. Refused meds for constipation while at [facility]. Tried to put a plate in the toaster so family thinks pt is confused. Pt refused to answer ; A\T\O questions. Foley in place. Hx Diabetes. FINDINGS: LOWER CHEST: Trace bilateral pleural effusions. Transvenous pacing leads partially visualized, grossly stable. LIVER: Trace perihepatic fluid. GALLBLADDER AND BILE DUCTS: Gallbladder distended, with several layering partially calcified stones measuring up to 1.3 cm. No definite wall thickening or regional inflammatory change. No biliary ductal dilatation. SPLEEN: Normal size with scattered calcified punctate granulomas. PANCREAS: Diffuse pancreatic parenchymal atrophy without ductal dilatation, mass, or regional inflammatory change. ADRENAL GLANDS: Normal appearance. No mass. KIDNEYS, URETERS AND BLADDER: Urinary bladder decompressed by Foley catheter, appears diffusely thick walled. No stones in the kidneys or ureters. No hydronephrosis. No perinephric or periureteral stranding. GI AND BOWEL: Stomach demonstrates no acute abnormality. There is no bowel obstruction. No bowel wall thickening. PERITONEUM AND  RETROPERITONEUM: No free air. VASCULATURE: Scattered calcified aortoiliac atheromatous plaque without aneurysm or stenosis. LYMPH NODES: No lymphadenopathy. REPRODUCTIVE ORGANS: Prostate enlargement with scattered coarse calcifications. BONES AND SOFT TISSUES: Bilateral hip degenerative joint disease. Bilateral pelvic phleboliths. No acute osseous abnormality. No focal soft tissue abnormality. IMPRESSION: 1. No evidence of bowel obstruction or other acute findings . 2. Cholelithiasis Electronically signed by: Katheleen Faes MD 12/13/2023 03:29 PM EDT RP Workstation: [id number]   DG Chest Portable 1 View Result Date: 12/13/2023 CLINICAL DATA:  Altered mental status EXAM: PORTABLE CHEST 1 VIEW COMPARISON:  12/04/2023 FINDINGS: The patient is rotated to the left on today's radiograph, reducing diagnostic sensitivity and specificity. Dual lead pacer noted. Atherosclerotic calcification of the aortic arch. Heart size currently within normal limits. The lungs appear clear.  Mild thoracic spondylosis. IMPRESSION: 1. No acute findings. 2. Aortic Atherosclerosis (ICD10-I70.0). Electronically Signed   By: Ryan Salvage M.D.   On: 12/13/2023 12:23     Procedures   Medications Ordered in the ED  lactated ringers  bolus 500 mL (500 mLs Intravenous New Bag/Given 12/13/23 1517)  iohexol  (OMNIPAQUE ) 300 MG/ML solution 100 mL (100 mLs Intravenous Contrast Given 12/13/23 1420)    Clinical Course as of 12/13/23 1535  Tue Dec 13, 2023  1400 I was able to speak to wife at the bedside and while he does have some memory issues from time to time he is in general pretty sharp when not ill.  Yesterday he burned a plastic plate in the toaster which is certainly a typical behavior for him.  Seems to be much more confused than before and this is how he was in the hospital prior to getting better on some antibiotics.  He has been taking the antibiotics prescribed.  No known fevers.  This mental status changes certainly different  from his baseline. [SG]                                        Medical Decision Making Amount and/or Complexity of Data Reviewed Independent Historian: spouse External Data Reviewed: notes. Labs: ordered.    Details: Normal WBC Radiology: ordered and independent interpretation performed.    Details: No bowel obstruction ECG/medicine tests: ordered and independent interpretation performed.    Details: Sinus rhythm  Risk Prescription drug management.   Patient presents with worsening altered mental status.  Reportedly is fairly  sharp at baseline according to the wife.  Now is altered and even doing abnormal behavior such as putting a plate in a toaster.  No focal deficits on exam.  CT head and CT abdomen and pelvis are overall pending.  Care transferred to Dr. Neysa with further workup and disposition pending.     Final diagnoses:  None    ED Discharge Orders     None          Freddi Hamilton, MD 12/13/23 1536

## 2023-12-13 NOTE — ED Notes (Signed)
 Recollected ammonia and lactic. Called lab to verify they received them.

## 2023-12-13 NOTE — ED Provider Notes (Signed)
 Clinical Course as of 12/13/23 1705  Tue Dec 13, 2023  1400 I was able to speak to wife at the bedside and while he does have some memory issues from time to time he is in general pretty sharp when not ill.  Yesterday he burned a plastic plate in the toaster which is certainly a typical behavior for him.  Seems to be much more confused than before and this is how he was in the hospital prior to getting better on some antibiotics.  He has been taking the antibiotics prescribed.  No known fevers.  This mental status changes certainly different from his baseline. [SG]  1520 Signout pending scans.  [TY]  1702 CT Head Wo Contrast MPRESSION: 1. No acute intracranial abnormality. 2. 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   Electronically Signed   By: Andrea Gasman M.D.   On: 12/13/2023 15:37   [TY]  1702 CT ABDOMEN PELVIS W CONTRAST MPRESSION: 1. No evidence of bowel obstruction or other acute findings . 2. Cholelithiasis  Electronically signed by: Katheleen Faes MD 12/13/2023 03:29 PM EDT RP Workstation: [id number]   [TY]  1702 DG Chest Portable 1 View IMPRESSION: 1. No acute findings. 2. Aortic Atherosclerosis (ICD10-I70.0).   Electronically Signed   By: Ryan Salvage M.D.   [TY]  [phone] Patient continues to be altered, seems delirious on my exam somewhat following commands, but does not appear to be lucid.  Will admit for encephalopathy and for IV antibiotics to continue treatment of his UTI/bacteremia as I am concerned he would not be able to tolerate p.o. at home in his current state.  [TY]    Clinical Course User Index [SG] Freddi Hamilton, MD [TY] Neysa Caron PARAS, DO      Neysa Caron PARAS, [place] 12/13/23 1705

## 2023-12-13 NOTE — ED Notes (Signed)
 Pt keeps taking off vitals monitoring

## 2023-12-14 ENCOUNTER — Inpatient Hospital Stay: Admitting: Neurology

## 2023-12-14 ENCOUNTER — Telehealth: Payer: Self-pay

## 2023-12-14 ENCOUNTER — Other Ambulatory Visit (HOSPITAL_COMMUNITY): Payer: Self-pay

## 2023-12-14 DIAGNOSIS — Z794 Long term (current) use of insulin: Secondary | ICD-10-CM

## 2023-12-14 DIAGNOSIS — R4189 Other symptoms and signs involving cognitive functions and awareness: Secondary | ICD-10-CM | POA: Diagnosis not present

## 2023-12-14 DIAGNOSIS — R410 Disorientation, unspecified: Secondary | ICD-10-CM | POA: Diagnosis not present

## 2023-12-14 DIAGNOSIS — E119 Type 2 diabetes mellitus without complications: Secondary | ICD-10-CM | POA: Diagnosis not present

## 2023-12-14 LAB — CBC
HCT: 38.2 % — ABNORMAL LOW (ref 39.0–52.0)
Hemoglobin: 12.1 g/dL — ABNORMAL LOW (ref 13.0–17.0)
MCH: 27.2 pg (ref 26.0–34.0)
MCHC: 31.7 g/dL (ref 30.0–36.0)
MCV: 85.8 fL (ref 80.0–100.0)
Platelets: 250 K/uL (ref 150–400)
RBC: 4.45 MIL/uL (ref 4.22–5.81)
RDW: 18.6 % — ABNORMAL HIGH (ref 11.5–15.5)
WBC: 10 K/uL (ref 4.0–10.5)
nRBC: 0 % (ref 0.0–0.2)

## 2023-12-14 LAB — BASIC METABOLIC PANEL WITH GFR
Anion gap: 14 (ref 5–15)
BUN: 10 mg/dL (ref 8–23)
CO2: 23 mmol/L (ref 22–32)
Calcium: 9.6 mg/dL (ref 8.9–10.3)
Chloride: 100 mmol/L (ref 98–111)
Creatinine, Ser: 0.64 mg/dL (ref 0.61–1.24)
GFR, Estimated: >60 mL/min (ref >60)
Glucose, Bld: 123 mg/dL — ABNORMAL HIGH (ref 70–99)
Potassium: 3.8 mmol/L (ref 3.5–5.1)
Sodium: 137 mmol/L (ref 135–145)

## 2023-12-14 LAB — GLUCOSE, CAPILLARY
Glucose-Capillary: 124 mg/dL — ABNORMAL HIGH (ref 70–99)
Glucose-Capillary: 162 mg/dL — ABNORMAL HIGH (ref 70–99)
Glucose-Capillary: 147 mg/dL — ABNORMAL HIGH (ref 70–99)
Glucose-Capillary: 200 mg/dL — ABNORMAL HIGH (ref 70–99)

## 2023-12-14 LAB — LACTIC ACID, PLASMA: Lactic Acid, Venous: 0.9 mmol/L (ref 0.5–1.9)

## 2023-12-14 LAB — MAGNESIUM: Magnesium: 1.5 mg/dL — ABNORMAL LOW (ref 1.7–2.4)

## 2023-12-14 MED ORDER — FLEET ENEMA RE ENEM
1.0000 | ENEMA | Freq: Every day | RECTAL | Status: DC | PRN
  Filled 2023-12-14: qty 1

## 2023-12-14 MED ORDER — MAGNESIUM SULFATE 2 GM/50ML IV SOLN
2.0000 g | Freq: Once | INTRAVENOUS | Status: AC
  Administered 2023-12-14: 2 g via INTRAVENOUS
  Filled 2023-12-14: qty 50

## 2023-12-14 MED ORDER — MAGNESIUM CITRATE PO SOLN
1.0000 | Freq: Once | ORAL | Status: AC
  Administered 2023-12-14: 1 via ORAL
  Filled 2023-12-14: qty 296

## 2023-12-14 MED ORDER — LACTATED RINGERS IV SOLN: INTRAVENOUS | Status: DC

## 2023-12-14 MED ORDER — POLYETHYLENE GLYCOL 3350 17 G PO PACK
17.0000 g | PACK | Freq: Two times a day (BID) | ORAL | Status: DC
  Administered 2023-12-14 – 2023-12-15 (×2): 17 g via ORAL

## 2023-12-14 MED ORDER — CHLORHEXIDINE GLUCONATE CLOTH 2 % EX PADS
6.0000 | MEDICATED_PAD | Freq: Every day | CUTANEOUS | Status: DC
  Administered 2023-12-15: 6 via TOPICAL

## 2023-12-14 NOTE — Plan of Care (Signed)

## 2023-12-14 NOTE — Progress Notes (Signed)
 PROGRESS NOTE    John [name]  FMW:[id number] DOB: 05/10/1945 DOA: 12/13/2023 PCP: [name], DO   Brief Narrative:  John [name] is a 78 y.o. male with medical history significant of heart failure with reduced ejection fraction, CAD status post PERC coronary angioplasty, type 2 diabetes, recent admission for metabolic encephalopathy secondary to UTI, presenting to the emergency department for evaluation of altered mental status and vomiting per documentation.  Patient not cooperating with my exam, and unable to reach wife via telephone. (Call auto forwarded) history is limited. Per ED documentation, the wife admits patient does have some memory issues from time to time, but he is far from his baseline   Patient was hospitalized from 12/04/2023 to 12/10/2023 for acute metabolic encephalopathy, sepsis secondary to E faecalis UTI, Streptococcus bacteremia.  He was discharged home on 9 days of Zyvox  and wife reports he has been taking them as prescribed, except for today because he refused. She does not believe he ever returned to his baseline mental status following last hospitalization   In the emergency department, he was hypertensive, otherwise workup unremarkable.  He has not vomited and has a benign abdominal exam.  He did have a CT of his head which showed stable atrophy and small vessel ischemia.  CT abdomen pelvis was also unremarkable.  On my exam he was alert, however answered no to every question asked.  Assessment & Plan:   Principal Problem:   AMS (altered mental status) Active Problems:   Insulin  dependent type 2 diabetes mellitus (HCC)   Cognitive impairment  Altered mental status, ongoing Rule out sundowning - Differential includes metabolic encephalopathy, worsening underlying cognitive impairment in the setting of recent illness/hospitalization.  - CT head with stable atrophy and chronic microvascular ischemia - PT/OT following - Continue Lexapro  -  delerium precautions ongoing   Streptococcal bacteremia, POA E faecalis UTI - Discharged on 8/30 with oral linezolid  to complete 14 days of antibiotics, however wife cannot get him to take it today - TEE negative  - Continue linezolid  per previous schedule - Repeat urine culture pending.  He is not septic  Questionable constipation - per family no BM 'in days' but nonspecific - will continue supportive care with miralax /fleets enema/magnesium  citrate  Type 2 diabetes - SSI Hypotension - On midodrine .  Significantly hypertensive on arrival.  Hold for now and resume as indicated Chronic indwelling catheter - Per discharge summary on 830/2025 last exchanged first week in August HFrEF/CAD  - Appears euvolemic/stable. Not on GDMT given hypotension per 8/30's d/c summary   DVT prophylaxis: enoxaparin  (LOVENOX ) injection 40 mg Start: 12/13/23 2200   Code Status:   Code Status: Limited: Do not attempt resuscitation (DNR) -DNR-LIMITED -Do Not Intubate/DNI   Family Communication: None present, wife updated  Status is: Inpt  Dispo: The patient is from: Home              Anticipated d/c is to: Home              Anticipated d/c date is: 24-48h              Patient currently not medically stable for discharge  Consultants:  None  Procedures:  None  Antimicrobials:  Linezolid  (through 9/8)  Subjective: No acute issues/events reported overnight - ROS limited given mental status  Objective: Vitals:   12/13/23 2115 12/13/23 2203 12/14/23 0148 12/14/23 0507  BP: (!) 182/82 (!) 184/79 (!) 169/83 (!) 153/78  Pulse: 79 75 78 77  Resp: 15 16 16 17   Temp:  98.6 F (37 C) 99.2 F (37.3 C) 97.7 F (36.5 C)  TempSrc:  Oral  Oral  SpO2: 97% 97% 95% 96%  Weight:      Height:        Intake/Output Summary (Last 24 hours) at 12/14/2023 0744 Last data filed at 12/14/2023 0509 Gross per 24 hour  Intake 1000 ml  Output 1100 ml  Net -100 ml   Filed Weights   12/13/23 1018  Weight: 87 kg     Examination:  General exam: Appears calm and comfortable  Respiratory system: Clear to auscultation. Respiratory effort normal. Cardiovascular system: S1 & S2 heard, RRR. No JVD, murmurs, rubs, gallops or clicks. No pedal edema. Gastrointestinal system: Abdomen is nondistended, soft and nontender. No organomegaly or masses felt. Normal bowel sounds heard. Central nervous system: Alert and oriented. No focal neurological deficits. Extremities: Symmetric 5 x 5 power. Skin: No rashes, lesions or ulcers  Data Reviewed: I have personally reviewed following labs and imaging studies  CBC: Recent Labs  Lab 12/08/23 0231 12/13/23 1222 12/13/23 1229 12/13/23 2120 12/14/23 0517  WBC 7.8 9.1  --  10.5 10.0  NEUTROABS  --  6.4  --   --   --   HGB 11.6* 12.9* 13.9 12.4* 12.1*  HCT 34.8* 40.2 41.0 37.8* 38.2*  MCV 85.3 87.6  --  86.1 85.8  PLT 213 279  --  273 250   Basic Metabolic Panel: Recent Labs  Lab 12/08/23 0231 12/13/23 1222 12/13/23 1229 12/13/23 2120 12/14/23 0517  NA 140 139 138  --  137  K 3.7 4.0 3.9  --  3.8  CL 105 101 100  --  100  CO2 26 25  --   --  23  GLUCOSE 230* 118* 117*  --  123*  BUN 12 14 17   --  10  CREATININE 0.90 0.85 0.90 0.90 0.64  CALCIUM  9.0 9.9  --   --  9.6  MG  --   --   --   --  1.5*   GFR: Estimated Creatinine Clearance: 84.9 mL/min (by C-G formula based on SCr of 0.64 mg/dL). Liver Function Tests: Recent Labs  Lab 12/13/23 1222  AST 21  ALT 21  ALKPHOS 70  BILITOT 2.4*  PROT 6.1*  ALBUMIN 3.9   Recent Labs  Lab 12/13/23 1222  LIPASE 18   Recent Labs  Lab 12/13/23 1618  AMMONIA <13   Coagulation Profile: No results for input(s): INR, PROTIME in the last 168 hours. Cardiac Enzymes: No results for input(s): CKTOTAL, CKMB, CKMBINDEX, TROPONINI in the last 168 hours. BNP (last 3 results) No results for input(s): PROBNP in the last 8760 hours. HbA1C: No results for input(s): HGBA1C in the last 72  hours. CBG: Recent Labs  Lab 12/09/23 1725 12/09/23 2019 12/10/23 0811 12/13/23 2211 12/14/23 0731  GLUCAP 277* 271* 222* 136* 124*   Lipid Profile: No results for input(s): CHOL, HDL, LDLCALC, TRIG, CHOLHDL, LDLDIRECT in the last 72 hours. Thyroid  Function Tests: No results for input(s): TSH, T4TOTAL, FREET4, T3FREE, THYROIDAB in the last 72 hours. Anemia Panel: No results for input(s): VITAMINB12, FOLATE, FERRITIN, TIBC, IRON, RETICCTPCT in the last 72 hours. Sepsis Labs: Recent Labs  Lab 12/13/23 1619 12/14/23 0517  LATICACIDVEN 2.7* 0.9    Recent Results (from the past 240 hours)  Urine Culture     Status: Abnormal   Collection Time: 12/04/23  6:51 PM   Specimen: Urine,  Random  Result Value Ref Range Status   Specimen Description URINE, RANDOM  Final   Special Requests   Final    NONE Reflexed from [id number] Performed at [facility], [address], [place], [place] [address]    Culture 70,000 COLONIES/mL ENTEROCOCCUS FAECALIS (A)  Final   Report Status 12/07/2023 FINAL  Final   Organism ID, Bacteria ENTEROCOCCUS FAECALIS (A)  Final      Susceptibility   Enterococcus faecalis - MIC*    AMPICILLIN <=2 SENSITIVE Sensitive     NITROFURANTOIN <=16 SENSITIVE Sensitive     VANCOMYCIN  1 SENSITIVE Sensitive     * 70,000 COLONIES/mL ENTEROCOCCUS FAECALIS  Blood culture (routine x 2)     Status: Abnormal   Collection Time: 12/04/23  7:42 PM   Specimen: BLOOD RIGHT ARM  Result Value Ref Range Status   Specimen Description BLOOD RIGHT ARM  Final   Special Requests   Final    BOTTLES DRAWN AEROBIC AND ANAEROBIC Blood Culture results may not be optimal due to an inadequate volume of blood received in culture bottles   Culture  Setup Time   Final    GRAM POSITIVE COCCI IN CHAINS IN BOTH AEROBIC AND ANAEROBIC BOTTLES Organism ID to follow CRITICAL RESULT CALLED TO, READ BACK BY AND VERIFIED WITH: LOIS BARTER PHARMD, AT 1119 12/05/23 D.  VANHOOK Performed at [facility], [address], [place], [place] [address]    Culture STREPTOCOCCUS SALIVARIUS (A)  Final   Report Status 12/07/2023 FINAL  Final   Organism ID, Bacteria STREPTOCOCCUS SALIVARIUS  Final      Susceptibility   Streptococcus salivarius - MIC*    PENICILLIN 1 INTERMEDIATE Intermediate     CEFTRIAXONE  0.5 SENSITIVE Sensitive     ERYTHROMYCIN 2 RESISTANT Resistant     LEVOFLOXACIN 2 SENSITIVE Sensitive     VANCOMYCIN  0.5 SENSITIVE Sensitive     * STREPTOCOCCUS SALIVARIUS  Blood Culture ID Panel (Reflexed)     Status: Abnormal   Collection Time: 12/04/23  7:42 PM  Result Value Ref Range Status   Enterococcus faecalis NOT DETECTED NOT DETECTED Final   Enterococcus Faecium NOT DETECTED NOT DETECTED Final   Listeria monocytogenes NOT DETECTED NOT DETECTED Final   Staphylococcus species NOT DETECTED NOT DETECTED Final   Staphylococcus aureus (BCID) NOT DETECTED NOT DETECTED Final   Staphylococcus epidermidis NOT DETECTED NOT DETECTED Final   Staphylococcus lugdunensis NOT DETECTED NOT DETECTED Final   Streptococcus species DETECTED (A) NOT DETECTED Final    Comment: Not Enterococcus species, Streptococcus agalactiae, Streptococcus pyogenes, or Streptococcus pneumoniae. CRITICAL RESULT CALLED TO, READ BACK BY AND VERIFIED WITH: LOIS BARTER PHARMD, AT 1119 12/05/23 D. VANHOOK    Streptococcus agalactiae NOT DETECTED NOT DETECTED Final   Streptococcus pneumoniae NOT DETECTED NOT DETECTED Final   Streptococcus pyogenes NOT DETECTED NOT DETECTED Final   A.calcoaceticus-baumannii NOT DETECTED NOT DETECTED Final   Bacteroides fragilis NOT DETECTED NOT DETECTED Final   Enterobacterales NOT DETECTED NOT DETECTED Final   Enterobacter cloacae complex NOT DETECTED NOT DETECTED Final   Escherichia coli NOT DETECTED NOT DETECTED Final   Klebsiella aerogenes NOT DETECTED NOT DETECTED Final   Klebsiella oxytoca NOT DETECTED NOT DETECTED Final   Klebsiella pneumoniae  NOT DETECTED NOT DETECTED Final   Proteus species NOT DETECTED NOT DETECTED Final   Salmonella species NOT DETECTED NOT DETECTED Final   Serratia marcescens NOT DETECTED NOT DETECTED Final   Haemophilus influenzae NOT DETECTED NOT DETECTED Final  Neisseria meningitidis NOT DETECTED NOT DETECTED Final   Pseudomonas aeruginosa NOT DETECTED NOT DETECTED Final   Stenotrophomonas maltophilia NOT DETECTED NOT DETECTED Final   Candida albicans NOT DETECTED NOT DETECTED Final   Candida auris NOT DETECTED NOT DETECTED Final   Candida glabrata NOT DETECTED NOT DETECTED Final   Candida krusei NOT DETECTED NOT DETECTED Final   Candida parapsilosis NOT DETECTED NOT DETECTED Final   Candida tropicalis NOT DETECTED NOT DETECTED Final   Cryptococcus neoformans/gattii NOT DETECTED NOT DETECTED Final    Comment: Performed at [facility], [address] [place], [place] [address]  Resp panel by RT-PCR (RSV, Flu A&B, Covid) Anterior Nasal Swab     Status: None   Collection Time: 12/04/23  8:40 PM   Specimen: Anterior Nasal Swab  Result Value Ref Range Status   SARS Coronavirus 2 by RT PCR NEGATIVE NEGATIVE Final   Influenza A by PCR NEGATIVE NEGATIVE Final   Influenza B by PCR NEGATIVE NEGATIVE Final    Comment: (NOTE) The Xpert Xpress SARS-CoV-2/FLU/RSV plus assay is intended as an aid in the diagnosis of influenza from Nasopharyngeal swab specimens and should not be used as a sole basis for treatment. Nasal washings and aspirates are unacceptable for Xpert Xpress SARS-CoV-2/FLU/RSV testing.  Fact Sheet for Patients: BloggerCourse.com  Fact Sheet for Healthcare Providers: SeriousBroker.it  This test is not yet approved or cleared by the [place]  FDA and has been authorized for detection and/or diagnosis of SARS-CoV-2 by FDA under an Emergency Use Authorization (EUA). This EUA will remain in effect (meaning this test can be used) for  the duration of the COVID-19 declaration under Section 564(b)(1) of the Act, 21 U.S.C. section 360bbb-3(b)(1), unless the authorization is terminated or revoked.     Resp Syncytial Virus by PCR NEGATIVE NEGATIVE Final    Comment: (NOTE) Fact Sheet for Patients: BloggerCourse.com  Fact Sheet for Healthcare Providers: SeriousBroker.it  This test is not yet approved or cleared by the [place]  FDA and has been authorized for detection and/or diagnosis of SARS-CoV-2 by FDA under an Emergency Use Authorization (EUA). This EUA will remain in effect (meaning this test can be used) for the duration of the COVID-19 declaration under Section 564(b)(1) of the Act, 21 U.S.C. section 360bbb-3(b)(1), unless the authorization is terminated or revoked.  Performed at [facility], [address], [place], [place] [address]   Blood culture (routine x 2)     Status: None   Collection Time: 12/05/23  3:23 AM   Specimen: BLOOD  Result Value Ref Range Status   Specimen Description BLOOD BLOOD LEFT ARM  Final   Special Requests   Final    BOTTLES DRAWN AEROBIC ONLY Blood Culture adequate volume   Culture   Final    NO GROWTH 5 DAYS Performed at [facility], [address] [place], [place] [address]    Report Status 12/10/2023 FINAL  Final         Radiology Studies: CT Head Wo Contrast Result Date: 12/13/2023 CLINICAL DATA:  Mental status change, unknown cause EXAM: CT HEAD WITHOUT CONTRAST TECHNIQUE: Contiguous axial images were obtained from the base of the skull through the vertex without intravenous contrast. RADIATION DOSE REDUCTION: This exam was performed according to the departmental dose-optimization program which includes automated exposure control, adjustment of the mA and/or kV according to patient size and/or use of iterative reconstruction technique. COMPARISON:  Head CT 12/04/2023 FINDINGS: Brain: No intracranial  hemorrhage, mass  effect, or midline shift. Stable degree of atrophy and chronic small vessel ischemia. Chronic lacunar infarcts in the basal ganglia. No hydrocephalus. The basilar cisterns are patent. No evidence of territorial infarct or acute ischemia. No extra-axial or intracranial fluid collection. Vascular: Atherosclerosis of skullbase vasculature without hyperdense vessel or abnormal calcification. Skull: No fracture or focal lesion. Sinuses/Orbits: No acute findings.  Bilateral cataract resection Other: None. IMPRESSION: 1. No acute intracranial abnormality. 2. 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 Electronically Signed   By: Andrea Gasman M.D.   On: 12/13/2023 15:37   CT ABDOMEN PELVIS W CONTRAST Result Date: 12/13/2023 EXAM: CT ABDOMEN AND PELVIS WITH CONTRAST 12/13/2023 02:39:23 PM TECHNIQUE: CT of the abdomen and pelvis was performed with the administration of intravenous contrast. Multiplanar reformatted images are provided for review. Automated exposure control, iterative reconstruction, and/or weight-based adjustment of the mA/kV was utilized to reduce the radiation dose to as low as reasonably achievable. COMPARISON: 12/04/2023 CLINICAL HISTORY: Bowel obstruction suspected. Pt bib EMS from home. Pt was at [facility] dc Sunday after sepsis. Yesterday vomiting. Today constipated x1 wk possibly. Refused meds for constipation while at [facility]. Tried to put a plate in the toaster so family thinks pt is confused. Pt refused to answer ; A\T\O questions. Foley in place. Hx Diabetes. FINDINGS: LOWER CHEST: Trace bilateral pleural effusions. Transvenous pacing leads partially visualized, grossly stable. LIVER: Trace perihepatic fluid. GALLBLADDER AND BILE DUCTS: Gallbladder distended, with several layering partially calcified stones measuring up to 1.3 cm. No definite wall thickening or regional inflammatory change. No biliary ductal dilatation. SPLEEN: Normal size with scattered calcified punctate  granulomas. PANCREAS: Diffuse pancreatic parenchymal atrophy without ductal dilatation, mass, or regional inflammatory change. ADRENAL GLANDS: Normal appearance. No mass. KIDNEYS, URETERS AND BLADDER: Urinary bladder decompressed by Foley catheter, appears diffusely thick walled. No stones in the kidneys or ureters. No hydronephrosis. No perinephric or periureteral stranding. GI AND BOWEL: Stomach demonstrates no acute abnormality. There is no bowel obstruction. No bowel wall thickening. PERITONEUM AND RETROPERITONEUM: No free air. VASCULATURE: Scattered calcified aortoiliac atheromatous plaque without aneurysm or stenosis. LYMPH NODES: No lymphadenopathy. REPRODUCTIVE ORGANS: Prostate enlargement with scattered coarse calcifications. BONES AND SOFT TISSUES: Bilateral hip degenerative joint disease. Bilateral pelvic phleboliths. No acute osseous abnormality. No focal soft tissue abnormality. IMPRESSION: 1. No evidence of bowel obstruction or other acute findings . 2. Cholelithiasis Electronically signed by: Katheleen Faes MD 12/13/2023 03:29 PM EDT RP Workstation: [id number]   DG Chest Portable 1 View Result Date: 12/13/2023 CLINICAL DATA:  Altered mental status EXAM: PORTABLE CHEST 1 VIEW COMPARISON:  12/04/2023 FINDINGS: The patient is rotated to the left on today's radiograph, reducing diagnostic sensitivity and specificity. Dual lead pacer noted. Atherosclerotic calcification of the aortic arch. Heart size currently within normal limits. The lungs appear clear.  Mild thoracic spondylosis. IMPRESSION: 1. No acute findings. 2. Aortic Atherosclerosis (ICD10-I70.0). Electronically Signed   By: Ryan Salvage M.D.   On: 12/13/2023 12:23        Scheduled Meds:  atorvastatin   80 mg Oral QHS   enoxaparin  (LOVENOX ) injection  40 mg Subcutaneous Q24H   escitalopram   10 mg Oral Daily   insulin  aspart  0-5 Units Subcutaneous QHS   insulin  aspart  0-9 Units Subcutaneous TID WC   linezolid   600 mg Oral BID    Continuous Infusions:  lactated ringers      magnesium  sulfate bolus IVPB       LOS: 0 days   Time spent:  Elsie JAYSON Montclair,  DO [facility]  If 7PM-7AM, please contact night-coverage [email or web address]  12/14/2023, 7:44 AM

## 2023-12-14 NOTE — Plan of Care (Signed)
 Problem: Education: Goal: Ability to describe self-care measures that may prevent or decrease complications (Diabetes Survival Skills Education) will improve 12/14/2023 2355 by Burnetta Derrek HERO, RN Outcome: Progressing 12/14/2023 2355 by Burnetta Derrek HERO, RN Outcome: Progressing Goal: Individualized Educational Video(s) 12/14/2023 2355 by Burnetta Derrek HERO, RN Outcome: Progressing 12/14/2023 2355 by Burnetta Derrek HERO, RN Outcome: Progressing   Problem: Coping: Goal: Ability to adjust to condition or change in health will improve 12/14/2023 2355 by Burnetta Derrek HERO, RN Outcome: Progressing 12/14/2023 2355 by Burnetta Derrek HERO, RN Outcome: Progressing   Problem: Fluid Volume: Goal: Ability to maintain a balanced intake and output will improve 12/14/2023 2355 by Burnetta Derrek HERO, RN Outcome: Progressing 12/14/2023 2355 by Burnetta Derrek HERO, RN Outcome: Progressing   Problem: Health Behavior/Discharge Planning: Goal: Ability to identify and utilize available resources and services will improve 12/14/2023 2355 by Burnetta Derrek HERO, RN Outcome: Progressing 12/14/2023 2355 by Burnetta Derrek HERO, RN Outcome: Progressing Goal: Ability to manage health-related needs will improve 12/14/2023 2355 by Burnetta Derrek HERO, RN Outcome: Progressing 12/14/2023 2355 by Burnetta Derrek HERO, RN Outcome: Progressing   Problem: Metabolic: Goal: Ability to maintain appropriate glucose levels will improve 12/14/2023 2355 by Burnetta Derrek HERO, RN Outcome: Progressing 12/14/2023 2355 by Burnetta Derrek HERO, RN Outcome: Progressing   Problem: Nutritional: Goal: Maintenance of adequate nutrition will improve 12/14/2023 2355 by Burnetta Derrek HERO, RN Outcome: Progressing 12/14/2023 2355 by Burnetta Derrek HERO, RN Outcome: Progressing Goal: Progress toward achieving an optimal weight will improve 12/14/2023 2355 by Burnetta Derrek HERO, RN Outcome: Progressing 12/14/2023 2355 by Burnetta Derrek HERO, RN Outcome: Progressing   Problem: Skin Integrity: Goal: Risk for  impaired skin integrity will decrease 12/14/2023 2355 by Burnetta Derrek HERO, RN Outcome: Progressing 12/14/2023 2355 by Burnetta Derrek HERO, RN Outcome: Progressing   Problem: Tissue Perfusion: Goal: Adequacy of tissue perfusion will improve 12/14/2023 2355 by Burnetta Derrek HERO, RN Outcome: Progressing 12/14/2023 2355 by Burnetta Derrek HERO, RN Outcome: Progressing   Problem: Education: Goal: Knowledge of General Education information will improve Description: Including pain rating scale, medication(s)/side effects and non-pharmacologic comfort measures 12/14/2023 2355 by Burnetta Derrek HERO, RN Outcome: Progressing 12/14/2023 2355 by Burnetta Derrek HERO, RN Outcome: Progressing   Problem: Health Behavior/Discharge Planning: Goal: Ability to manage health-related needs will improve 12/14/2023 2355 by Burnetta Derrek HERO, RN Outcome: Progressing 12/14/2023 2355 by Burnetta Derrek HERO, RN Outcome: Progressing   Problem: Clinical Measurements: Goal: Ability to maintain clinical measurements within normal limits will improve 12/14/2023 2355 by Burnetta Derrek HERO, RN Outcome: Progressing 12/14/2023 2355 by Burnetta Derrek HERO, RN Outcome: Progressing Goal: Will remain free from infection 12/14/2023 2355 by Burnetta Derrek HERO, RN Outcome: Progressing 12/14/2023 2355 by Burnetta Derrek HERO, RN Outcome: Progressing Goal: Diagnostic test results will improve 12/14/2023 2355 by Burnetta Derrek HERO, RN Outcome: Progressing 12/14/2023 2355 by Burnetta Derrek HERO, RN Outcome: Progressing Goal: Respiratory complications will improve 12/14/2023 2355 by Burnetta Derrek HERO, RN Outcome: Progressing 12/14/2023 2355 by Burnetta Derrek HERO, RN Outcome: Progressing Goal: Cardiovascular complication will be avoided Outcome: Progressing   Problem: Activity: Goal: Risk for activity intolerance will decrease Outcome: Progressing   Problem: Nutrition: Goal: Adequate nutrition will be maintained Outcome: Progressing   Problem: Coping: Goal: Level of anxiety will  decrease Outcome: Progressing   Problem: Elimination: Goal: Will not experience complications related to bowel motility Outcome: Progressing Goal: Will not experience complications related to urinary retention Outcome: Progressing   Problem: Pain Managment: Goal: General experience of comfort will  improve and/or be controlled Outcome: Progressing   Problem: Safety: Goal: Ability to remain free from injury will improve Outcome: Progressing   Problem: Skin Integrity: Goal: Risk for impaired skin integrity will decrease Outcome: Progressing

## 2023-12-14 NOTE — Telephone Encounter (Signed)
 Copied from CRM [id number]. Topic: General - Other >> Dec 14, 2023 10:46 AM Burnard DEL wrote: Reason for CRM: [place] from [facility] called in stating that she was completing patients medications and there was a drug interactions  clopidogrel  (PLAVIX ) 75 MG tablet and fluconazole  (DIFLUCAN ) 100 MG tablet as well as escitalopram  (LEXAPRO ) 10 MG tablet  and fluconazole  (DIFLUCAN ) 100 MG tablet escitalopram  (LEXAPRO ) 10 MG tablet also with linezolid  (ZYVOX ) 600 MG tablet. She would like a verbal confirmation for start of care for 12/12/2023 for 2wk2 ,1 wk 7 2 prn's .Patient is currently in the hospital. Nurse will call with orders for resumption of care once discharged.

## 2023-12-15 ENCOUNTER — Other Ambulatory Visit (HOSPITAL_BASED_OUTPATIENT_CLINIC_OR_DEPARTMENT_OTHER): Payer: Self-pay

## 2023-12-15 DIAGNOSIS — E119 Type 2 diabetes mellitus without complications: Secondary | ICD-10-CM | POA: Diagnosis not present

## 2023-12-15 DIAGNOSIS — Z794 Long term (current) use of insulin: Secondary | ICD-10-CM | POA: Diagnosis not present

## 2023-12-15 DIAGNOSIS — R4189 Other symptoms and signs involving cognitive functions and awareness: Secondary | ICD-10-CM | POA: Diagnosis not present

## 2023-12-15 DIAGNOSIS — R410 Disorientation, unspecified: Secondary | ICD-10-CM | POA: Diagnosis not present

## 2023-12-15 LAB — URINE CULTURE: Culture: 20000 — AB

## 2023-12-15 LAB — GLUCOSE, CAPILLARY
Glucose-Capillary: 148 mg/dL — ABNORMAL HIGH (ref 70–99)
Glucose-Capillary: 193 mg/dL — ABNORMAL HIGH (ref 70–99)

## 2023-12-15 MED ORDER — SENNOSIDES-DOCUSATE SODIUM 8.6-50 MG PO TABS
1.0000 | ORAL_TABLET | Freq: Two times a day (BID) | ORAL | 0 refills | Status: DC
  Filled 2023-12-15: qty 100, 50d supply, fill #0

## 2023-12-15 MED ORDER — MAGNESIUM CITRATE 200 MG PO TABS
200.0000 mg | ORAL_TABLET | Freq: Two times a day (BID) | ORAL | 0 refills | Status: DC
  Filled 2023-12-15: qty 40, 20d supply, fill #0

## 2023-12-15 NOTE — Discharge Summary (Signed)
 Physician Discharge Summary  John [name] FMW:[id number] DOB: [birth year] DOA: 12/13/2023  PCP: [name], DO  Admit date: 12/13/2023 Discharge date: 12/15/2023  Admitted From: Home Disposition: Home  Recommendations for Outpatient Follow-up:  Follow up with PCP in 1-2 weeks  Home Health: Resume home health Equipment/Devices: No new equipment  Discharge Condition: Stable CODE STATUS: DNR Diet recommendation: Regular diet as tolerated  Brief/Interim Summary: John [name] is a 78 y.o. male with medical history significant of heart failure with reduced ejection fraction, CAD status post PERC coronary angioplasty, type 2 diabetes, recent admission for metabolic encephalopathy secondary to UTI, presenting to the emergency department for evaluation of altered mental status and vomiting per documentation.  Patient not cooperating with my exam, and unable to reach wife via telephone. (Call auto forwarded) history is limited. Per ED documentation, the wife admits patient does have some memory issues from time to time, but he is far from his baseline   Patient was recently hospitalized from 12/04/2023 to 12/10/2023 for acute metabolic encephalopathy, sepsis secondary to E faecalis UTI, Streptococcus bacteremia.  He was discharged home on 9 days of Zyvox  and wife reports he has been taking them as prescribed, except for today because he refused. She does not believe he ever returned to his baseline mental status following last hospitalization  Patient admitted as above with reported altered mental status in the setting of recent infection.  Mental status has improved, lengthy discussion with patient and family in regards to patient's noncompliance with medications as well as what appears to be worsening constipation.  Will discharge with increased regimen as below, tentative plan to see primary care physician Dr. Frann tomorrow on 12/16/2023.  We recommend to continue this appointment in  hopes that he may be able to convince the patient to be more compliant with his medications.  Patient otherwise stable for discharge home.  Continue previous antibiotic course without changes.   Discharge Diagnoses:  Principal Problem:   AMS (altered mental status) Active Problems:   Insulin  dependent type 2 diabetes mellitus (HCC)   Cognitive impairment  Altered mental status, resolved Rule out sundowning - Concern for hospital delirium given patient's mental status here and recent weeklong hospitalization - **MRI January 2025 does report area of ADC concerning for Alzheimer's dementia. - Appears to be approaching baseline, remains noncompliant with medications which is not new per patient's wife - Recommend outpatient evaluation for underlying dementia/cognitive impairment - CT head with stable atrophy and chronic microvascular ischemia - Continue escitalopram    Streptococcal bacteremia, POA E faecalis UTI - Discharged on 8/30 with oral linezolid  to complete 14 days of antibiotics - TEE negative at that time - Continue linezolid  per previous schedule - Repeat culture unremarkable, 20K colonies of yeast likely in the setting of recent hospitalization and Foley catheter placement   Questionable constipation -continue supportive care, discussed at length with wife to increase patient's free water intake as well as fiber to increase over-the-counter regimen as below with magnesium  citrate and Senokot.  Patient refuses to take medications here for our team, has refused enemas as well -hoping patient will be more compliant at home with known caretakers/family   Type 2 diabetes - SSI History of hypotension -previously on midodrine , discontinued given normotensive during hospitalization Chronic indwelling catheter - Per discharge summary on 830/2025 last exchanged first week in August HFrEF/CAD - Appears euvolemic/stable. Not on GDMT given hypotension per 8/30's d/c summary   Discharge  Instructions  Discharge Instructions  Call MD for:  difficulty breathing, headache or visual disturbances   Complete by: As directed    Call MD for:  extreme fatigue   Complete by: As directed    Call MD for:  hives   Complete by: As directed    Call MD for:  persistant dizziness or light-headedness   Complete by: As directed    Call MD for:  persistant nausea and vomiting   Complete by: As directed    Call MD for:  severe uncontrolled pain   Complete by: As directed    Call MD for:  temperature >100.4   Complete by: As directed    Diet - low sodium heart healthy   Complete by: As directed    Discharge instructions   Complete by: As directed    Add magnesium  citrate and senna-docusate as discussed. Would recommend as needed enema if tolerated (Fleets for example).   Increase activity slowly   Complete by: As directed       Allergies as of 12/15/2023       Reactions   Ramipril Anaphylaxis   Adhesive [tape] Rash   Other Diarrhea   Severe intolerance to Chemotherapy in the past.   Sertraline Other (See Comments)   Extreme headaches        Medication List     STOP taking these medications    fluconazole  100 MG tablet Commonly known as: DIFLUCAN    loperamide  2 MG capsule Commonly known as: IMODIUM    midodrine  10 MG tablet Commonly known as: PROAMATINE        TAKE these medications    acetaminophen  650 MG CR tablet Commonly known as: TYLENOL  Take 1 tablet (650 mg total) by mouth every 8 (eight) hours as needed for pain.   atorvastatin  80 MG tablet Commonly known as: LIPITOR  Take 1 tablet (80 mg total) by mouth at bedtime.   clopidogrel  75 MG tablet Commonly known as: PLAVIX  Take 1 tablet (75 mg total) by mouth daily with breakfast.   clotrimazole -betamethasone  cream Commonly known as: LOTRISONE  Apply 1 Application topically daily. What changed:  when to take this reasons to take this   cyanocobalamin  1000 MCG tablet Commonly known as:  VITAMIN B12 Take 1 tablet (1,000 mcg total) by mouth daily.   escitalopram  10 MG tablet Commonly known as: Lexapro  Take 1 tablet (10 mg total) by mouth daily.   FeroSul 325 (65 Fe) MG tablet Generic drug: ferrous sulfate  Take 1 tablet (325 mg total) by mouth daily with breakfast.   finasteride  5 MG tablet Commonly known as: PROSCAR  Take 1 tablet (5 mg total) by mouth daily. *Need appointment for future refills.*   hyoscyamine  0.125 MG SL tablet Commonly known as: LEVSIN  SL Place 1 tablet (0.125 mg total) under the tongue every 4 (four) hours as needed for bladder spasms.   insulin  glargine 100 UNIT/ML injection Commonly known as: LANTUS  Inject 0.15 mLs (15 Units total) into the skin daily for 7 days.   linezolid  600 MG tablet Commonly known as: ZYVOX  Take 1 tablet (600 mg total) by mouth 2 (two) times daily for 9 days.   Magnesium  Citrate 200 MG Tabs Take 200 mg by mouth 2 (two) times daily before a meal.   metFORMIN  500 MG tablet Commonly known as: GLUCOPHAGE  Take 1 tablet (500 mg total) by mouth 2 (two) times daily with a meal.   multivitamin tablet Take 1 tablet by mouth 2 (two) times a week.   pantoprazole  40 MG tablet Commonly known as: PROTONIX   Take 1 tablet (40 mg total) by mouth at bedtime.   senna-docusate 8.6-50 MG tablet Commonly known as: Senokot-S Take 1 tablet by mouth 2 (two) times daily.   vitamin C 1000 MG tablet Take 1,000 mg by mouth 3 (three) times a week.        Allergies  Allergen Reactions   Ramipril Anaphylaxis   Adhesive [Tape] Rash   Other Diarrhea    Severe intolerance to Chemotherapy in the past.   Sertraline Other (See Comments)    Extreme headaches    Consultations: None  Procedures/Studies: CT Head Wo Contrast Result Date: 12/13/2023 CLINICAL DATA:  Mental status change, unknown cause EXAM: CT HEAD WITHOUT CONTRAST TECHNIQUE: Contiguous axial images were obtained from the base of the skull through the vertex without  intravenous contrast. RADIATION DOSE REDUCTION: This exam was performed according to the departmental dose-optimization program which includes automated exposure control, adjustment of the mA and/or kV according to patient size and/or use of iterative reconstruction technique. COMPARISON:  Head CT 12/04/2023 FINDINGS: Brain: No intracranial hemorrhage, mass effect, or midline shift. Stable degree of atrophy and chronic small vessel ischemia. Chronic lacunar infarcts in the basal ganglia. No hydrocephalus. The basilar cisterns are patent. No evidence of territorial infarct or acute ischemia. No extra-axial or intracranial fluid collection. Vascular: Atherosclerosis of skullbase vasculature without hyperdense vessel or abnormal calcification. Skull: No fracture or focal lesion. Sinuses/Orbits: No acute findings.  Bilateral cataract resection Other: None. IMPRESSION: 1. No acute intracranial abnormality. 2. 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 Electronically Signed   By: Andrea Gasman M.D.   On: 12/13/2023 15:37   CT ABDOMEN PELVIS W CONTRAST Result Date: 12/13/2023 EXAM: CT ABDOMEN AND PELVIS WITH CONTRAST 12/13/2023 02:39:23 PM TECHNIQUE: CT of the abdomen and pelvis was performed with the administration of intravenous contrast. Multiplanar reformatted images are provided for review. Automated exposure control, iterative reconstruction, and/or weight-based adjustment of the mA/kV was utilized to reduce the radiation dose to as low as reasonably achievable. COMPARISON: 12/04/2023 CLINICAL HISTORY: Bowel obstruction suspected. Pt bib EMS from home. Pt was at [facility] dc Sunday after sepsis. Yesterday vomiting. Today constipated x1 wk possibly. Refused meds for constipation while at [facility]. Tried to put a plate in the toaster so family thinks pt is confused. Pt refused to answer ; A\T\O questions. Foley in place. Hx Diabetes. FINDINGS: LOWER CHEST: Trace bilateral pleural effusions. Transvenous pacing leads  partially visualized, grossly stable. LIVER: Trace perihepatic fluid. GALLBLADDER AND BILE DUCTS: Gallbladder distended, with several layering partially calcified stones measuring up to 1.3 cm. No definite wall thickening or regional inflammatory change. No biliary ductal dilatation. SPLEEN: Normal size with scattered calcified punctate granulomas. PANCREAS: Diffuse pancreatic parenchymal atrophy without ductal dilatation, mass, or regional inflammatory change. ADRENAL GLANDS: Normal appearance. No mass. KIDNEYS, URETERS AND BLADDER: Urinary bladder decompressed by Foley catheter, appears diffusely thick walled. No stones in the kidneys or ureters. No hydronephrosis. No perinephric or periureteral stranding. GI AND BOWEL: Stomach demonstrates no acute abnormality. There is no bowel obstruction. No bowel wall thickening. PERITONEUM AND RETROPERITONEUM: No free air. VASCULATURE: Scattered calcified aortoiliac atheromatous plaque without aneurysm or stenosis. LYMPH NODES: No lymphadenopathy. REPRODUCTIVE ORGANS: Prostate enlargement with scattered coarse calcifications. BONES AND SOFT TISSUES: Bilateral hip degenerative joint disease. Bilateral pelvic phleboliths. No acute osseous abnormality. No focal soft tissue abnormality. IMPRESSION: 1. No evidence of bowel obstruction or other acute findings . 2. Cholelithiasis Electronically signed by: Katheleen Faes MD 12/13/2023 03:29 PM EDT RP Workstation: [id number]  DG Chest Portable 1 View Result Date: 12/13/2023 CLINICAL DATA:  Altered mental status EXAM: PORTABLE CHEST 1 VIEW COMPARISON:  12/04/2023 FINDINGS: The patient is rotated to the left on today's radiograph, reducing diagnostic sensitivity and specificity. Dual lead pacer noted. Atherosclerotic calcification of the aortic arch. Heart size currently within normal limits. The lungs appear clear.  Mild thoracic spondylosis. IMPRESSION: 1. No acute findings. 2. Aortic Atherosclerosis (ICD10-I70.0). Electronically  Signed   By: Ryan Salvage M.D.   On: 12/13/2023 12:23   ECHO TEE Result Date: 12/08/2023    TRANSESOPHOGEAL ECHO REPORT   Patient Name:   KAVAUGHN FAUCETT Rathod Date of Exam: 12/08/2023 Medical Rec #:  [id number]        Height:       72.0 in Accession #:    [id number]       Weight:       191.8 lb Date of Birth:  Sep 04, 1945        BSA:          2.093 m Patient Age:    78 years         BP:           167/82 mmHg Patient Gender: M                HR:           60 bpm. Exam Location:  Inpatient Procedure: Transesophageal Echo, Cardiac Doppler and Color Doppler (Both            Spectral and Color Flow Doppler were utilized during procedure). Indications:     Endocarditis  History:         Patient has prior history of Echocardiogram examinations, most                  recent 08/11/2023. HFrEF and CHF, CAD, Chronic Kidney Disease;                  Risk Factors:Diabetes, Dyslipidemia and Hypertension.  Sonographer:     Koleen Popper RDCS Referring Phys:  [id number] LEONTINE SAILOR LOCKWOOD Diagnosing Phys: Annabella Scarce MD PROCEDURE: After discussion of the risks and benefits of a TEE, an informed consent was obtained from the patient. The transesophogeal probe was passed without difficulty through the esophogus of the patient. Imaged were obtained with the patient in a left lateral decubitus position. Sedation performed by different physician. The patient was monitored while under deep sedation. Anesthestetic sedation was provided intravenously by Anesthesiology: 60mg  of Propofol , 20mg  of Lidocaine . The patient's vital  signs; including heart rate, blood pressure, and oxygen saturation; remained stable throughout the procedure. The patient developed no complications during the procedure.  IMPRESSIONS  1. Left ventricular ejection fraction, by estimation, is 30 to 35%. The left ventricle has moderately decreased function. The left ventricle demonstrates global hypokinesis.  2. Right ventricular systolic function is normal. The right  ventricular size is normal.  3. No left atrial/left atrial appendage thrombus was detected.  4. The mitral valve is normal in structure. Mild mitral valve regurgitation. No evidence of mitral stenosis. There is mild prolapse of the middle scallop of the posterior leaflet of the mitral valve.  5. The aortic valve is tricuspid. Aortic valve regurgitation is trivial. No aortic stenosis is present.  6. There is mild (Grade II) protruding plaque involving the aortic root. Conclusion(s)/Recommendation(s): No evidence of vegetation/infective endocarditis on this transesophageael echocardiogram. FINDINGS  Left Ventricle: Left ventricular ejection fraction, by estimation, is 30 to 35%. The  left ventricle has moderately decreased function. The left ventricle demonstrates global hypokinesis. The left ventricular internal cavity size was normal in size. There is no left ventricular hypertrophy. Right Ventricle: The right ventricular size is normal. No increase in right ventricular wall thickness. Right ventricular systolic function is normal. Left Atrium: Left atrial size was normal in size. No left atrial/left atrial appendage thrombus was detected. Right Atrium: Right atrial size was normal in size. Pericardium: There is no evidence of pericardial effusion. Mitral Valve: The mitral valve is normal in structure. There is mild prolapse of the middle scallop of the posterior leaflet of the mitral valve. Mild mitral valve regurgitation. No evidence of mitral valve stenosis. Tricuspid Valve: The tricuspid valve is normal in structure. Tricuspid valve regurgitation is mild . No evidence of tricuspid stenosis. Aortic Valve: The aortic valve is tricuspid. Aortic valve regurgitation is trivial. No aortic stenosis is present. Pulmonic Valve: The pulmonic valve was normal in structure. Pulmonic valve regurgitation is not visualized. No evidence of pulmonic stenosis. Aorta: The aortic root is normal in size and structure. There is mild  (Grade II) protruding plaque involving the aortic root. IAS/Shunts: No atrial level shunt detected by color flow Doppler. Additional Comments: Spectral Doppler performed. LEFT VENTRICLE PLAX 2D LVOT diam:     2.00 cm LVOT Area:     3.14 cm   AORTA Ao Root diam: 3.10 cm Ao Asc diam:  3.50 cm TRICUSPID VALVE TR Peak grad:   14.3 mmHg TR Vmax:        189.00 cm/s  SHUNTS Systemic Diam: 2.00 cm Annabella Scarce MD Electronically signed by Annabella Scarce MD Signature Date/Time: 12/08/2023/3:58:52 PM    Final    EP STUDY Result Date: 12/08/2023 See surgical note for result.  CT Angio Chest PE W and/or Wo Contrast Result Date: 12/04/2023 CLINICAL DATA:  Nausea and vomiting with chest and abdominal pain, initial encounter EXAM: CT ANGIOGRAPHY CHEST CT ABDOMEN AND PELVIS WITH CONTRAST TECHNIQUE: Multidetector CT imaging of the chest was performed using the standard protocol during bolus administration of intravenous contrast. Multiplanar CT image reconstructions and MIPs were obtained to evaluate the vascular anatomy. Multidetector CT imaging of the abdomen and pelvis was performed using the standard protocol during bolus administration of intravenous contrast. RADIATION DOSE REDUCTION: This exam was performed according to the departmental dose-optimization program which includes automated exposure control, adjustment of the mA and/or kV according to patient size and/or use of iterative reconstruction technique. CONTRAST:  75mL OMNIPAQUE  IOHEXOL  350 MG/ML SOLN COMPARISON:  None Available. FINDINGS: CTA CHEST FINDINGS Cardiovascular: Thoracic aorta shows atherosclerotic calcifications without aneurysmal dilatation or dissection. No cardiac enlargement is seen. Coronary calcifications are noted. The pulmonary artery shows a normal branching pattern bilaterally. No filling defect to suggest pulmonary embolism is noted. Mediastinum/Nodes: Thoracic inlet is within normal limits. No hilar or mediastinal adenopathy is noted.  The esophagus as visualized is within normal limits. Lungs/Pleura: Lungs are well aerated bilaterally. No focal infiltrate or sizable effusion is seen. Persistent pleural based density is noted in the anterolateral aspect of the right upper lobe best seen on image number 34 of series 9 stable from the prior exam and likely related to prior radiation therapy. Some scarring is noted in the right lung base. No focal nodule is seen. Musculoskeletal: Degenerative changes of the thoracic spine are noted. Review of the MIP images confirms the above findings. CT ABDOMEN and PELVIS FINDINGS Hepatobiliary: Dependent gallstones are noted. No wall thickening or pericholecystic fluid is noted.  The liver is within normal limits. Pancreas: Unremarkable. No pancreatic ductal dilatation or surrounding inflammatory changes. Spleen: Scattered calcified granulomas are noted. Adrenals/Urinary Tract: The adrenal glands again shows some nodular thickening although no discrete lesion is noted. The kidneys demonstrate a normal enhancement pattern bilaterally. No renal calculi or obstructive changes are seen. The bladder is decompressed by Foley catheter. Stomach/Bowel: Colon is predominately decompressed. No inflammatory changes are seen. The appendix is not well visualized and may have been surgically removed. No inflammatory changes to suggest appendicitis are noted. Stomach and small bowel are within normal limits. Vascular/Lymphatic: Aortic atherosclerosis. No enlarged abdominal or pelvic lymph nodes. Reproductive: Prostate is unremarkable. Other: No abdominal wall hernia or abnormality. No abdominopelvic ascites. Musculoskeletal: Mild degenerative changes of lumbar spine are noted. Review of the MIP images confirms the above findings. IMPRESSION: CTA of the chest: No evidence of pulmonary embolism. Chronic scarring in the right lung. CT of the abdomen and pelvis: Cholelithiasis without complicating factors. Changes of prior  granulomatous disease. No acute abnormality is seen. Electronically Signed   By: Oneil Devonshire M.D.   On: 12/04/2023 21:25   CT ABDOMEN PELVIS W CONTRAST Result Date: 12/04/2023 CLINICAL DATA:  Nausea and vomiting with chest and abdominal pain, initial encounter EXAM: CT ANGIOGRAPHY CHEST CT ABDOMEN AND PELVIS WITH CONTRAST TECHNIQUE: Multidetector CT imaging of the chest was performed using the standard protocol during bolus administration of intravenous contrast. Multiplanar CT image reconstructions and MIPs were obtained to evaluate the vascular anatomy. Multidetector CT imaging of the abdomen and pelvis was performed using the standard protocol during bolus administration of intravenous contrast. RADIATION DOSE REDUCTION: This exam was performed according to the departmental dose-optimization program which includes automated exposure control, adjustment of the mA and/or kV according to patient size and/or use of iterative reconstruction technique. CONTRAST:  75mL OMNIPAQUE  IOHEXOL  350 MG/ML SOLN COMPARISON:  None Available. FINDINGS: CTA CHEST FINDINGS Cardiovascular: Thoracic aorta shows atherosclerotic calcifications without aneurysmal dilatation or dissection. No cardiac enlargement is seen. Coronary calcifications are noted. The pulmonary artery shows a normal branching pattern bilaterally. No filling defect to suggest pulmonary embolism is noted. Mediastinum/Nodes: Thoracic inlet is within normal limits. No hilar or mediastinal adenopathy is noted. The esophagus as visualized is within normal limits. Lungs/Pleura: Lungs are well aerated bilaterally. No focal infiltrate or sizable effusion is seen. Persistent pleural based density is noted in the anterolateral aspect of the right upper lobe best seen on image number 34 of series 9 stable from the prior exam and likely related to prior radiation therapy. Some scarring is noted in the right lung base. No focal nodule is seen. Musculoskeletal: Degenerative  changes of the thoracic spine are noted. Review of the MIP images confirms the above findings. CT ABDOMEN and PELVIS FINDINGS Hepatobiliary: Dependent gallstones are noted. No wall thickening or pericholecystic fluid is noted. The liver is within normal limits. Pancreas: Unremarkable. No pancreatic ductal dilatation or surrounding inflammatory changes. Spleen: Scattered calcified granulomas are noted. Adrenals/Urinary Tract: The adrenal glands again shows some nodular thickening although no discrete lesion is noted. The kidneys demonstrate a normal enhancement pattern bilaterally. No renal calculi or obstructive changes are seen. The bladder is decompressed by Foley catheter. Stomach/Bowel: Colon is predominately decompressed. No inflammatory changes are seen. The appendix is not well visualized and may have been surgically removed. No inflammatory changes to suggest appendicitis are noted. Stomach and small bowel are within normal limits. Vascular/Lymphatic: Aortic atherosclerosis. No enlarged abdominal or pelvic lymph nodes. Reproductive: Prostate is  unremarkable. Other: No abdominal wall hernia or abnormality. No abdominopelvic ascites. Musculoskeletal: Mild degenerative changes of lumbar spine are noted. Review of the MIP images confirms the above findings. IMPRESSION: CTA of the chest: No evidence of pulmonary embolism. Chronic scarring in the right lung. CT of the abdomen and pelvis: Cholelithiasis without complicating factors. Changes of prior granulomatous disease. No acute abnormality is seen. Electronically Signed   By: Oneil Devonshire M.D.   On: 12/04/2023 21:25   CT Head Wo Contrast Result Date: 12/04/2023 CLINICAL DATA:  Delirium EXAM: CT HEAD WITHOUT CONTRAST TECHNIQUE: Contiguous axial images were obtained from the base of the skull through the vertex without intravenous contrast. RADIATION DOSE REDUCTION: This exam was performed according to the departmental dose-optimization program which includes  automated exposure control, adjustment of the mA and/or kV according to patient size and/or use of iterative reconstruction technique. COMPARISON:  CT head 10/26/2023 FINDINGS: Brain: Cerebral ventricle sizes are concordant with the degree of cerebral volume loss. Patchy and confluent areas of decreased attenuation are noted throughout the deep and periventricular white matter of the cerebral hemispheres bilaterally, compatible with chronic microvascular ischemic disease. No evidence of large-territorial acute infarction. No parenchymal hemorrhage. No mass lesion. No extra-axial collection. No mass effect or midline shift. No hydrocephalus. Basilar cisterns are patent. Vascular: No hyperdense vessel. Atherosclerotic calcifications are present within the cavernous internal carotid arteries. Skull: No acute fracture or focal lesion. Sinuses/Orbits: Paranasal sinuses and mastoid air cells are clear. The orbits are unremarkable. Other: None. IMPRESSION: No acute intracranial abnormality. Electronically Signed   By: Morgane  Naveau M.D.   On: 12/04/2023 21:21   DG Chest Port 1 View Result Date: 12/04/2023 CLINICAL DATA:  Vomiting. 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 The heart size and mediastinal contours are within normal limits. Mild calcification of the aortic arch is noted. Both lungs are clear. The visualized skeletal structures are unremarkable. IMPRESSION: No active disease. Electronically Signed   By: Suzen Dials M.D.   On: 12/04/2023 20:05     Subjective: No acute issues or events overnight   Discharge Exam: Vitals:   12/14/23 2013 12/15/23 0427  BP: (!) 145/67 (!) 145/75  Pulse: 62 70  Resp: (!) 21 12  Temp: 98.4 F (36.9 C) 98.2 F (36.8 C)  SpO2: 97% 96%   Vitals:   12/14/23 0957 12/14/23 1144 12/14/23 2013 12/15/23 0427  BP: (!) 159/83 (!) 143/77 (!) 145/67 (!) 145/75  Pulse: 68 69 62 70  Resp: 16 15 (!)  21 12  Temp: 98.7 F (37.1 C) 98.3 F (36.8 C) 98.4 F (36.9 C) 98.2 F (36.8 C)  TempSrc:      SpO2: 97% 97% 97% 96%  Weight:      Height:        General: Pt is alert, awake, not in acute distress Cardiovascular: RRR, S1/S2 +, no rubs, no gallops Respiratory: CTA bilaterally, no wheezing, no rhonchi Abdominal: Soft, NT, ND, bowel sounds + Extremities: no edema, no cyanosis    The results of significant diagnostics from this hospitalization (including imaging, microbiology, ancillary and laboratory) are listed below for reference.     Microbiology: Recent Results (from the past 240 hours)  Urine Culture     Status: Abnormal   Collection Time: 12/13/23  4:07 PM   Specimen: Urine, Random  Result Value Ref Range Status   Specimen Description   Final    URINE, RANDOM Performed  at [facility], [address], [place], [place] [address]    Special Requests   Final    NONE Reflexed from [id number] Performed at [facility], [address] [place], [place] [address]    Culture 20,000 COLONIES/mL YEAST (A)  Final   Report Status 12/15/2023 FINAL  Final     Labs: BNP (last 3 results) Recent Labs    08/06/23 1713  BNP 307.2*   Basic Metabolic Panel: Recent Labs  Lab 12/13/23 1222 12/13/23 1229 12/13/23 2120 12/14/23 0517  NA 139 138  --  137  K 4.0 3.9  --  3.8  CL 101 100  --  100  CO2 25  --   --  23  GLUCOSE 118* 117*  --  123*  BUN 14 17  --  10  CREATININE 0.85 0.90 0.90 0.64  CALCIUM  9.9  --   --  9.6  MG  --   --   --  1.5*   Liver Function Tests: Recent Labs  Lab 12/13/23 1222  AST 21  ALT 21  ALKPHOS 70  BILITOT 2.4*  PROT 6.1*  ALBUMIN 3.9   Recent Labs  Lab 12/13/23 1222  LIPASE 18   Recent Labs  Lab 12/13/23 1618  AMMONIA <13   CBC: Recent Labs  Lab 12/13/23 1222 12/13/23 1229 12/13/23 2120 12/14/23 0517  WBC 9.1  --  10.5 10.0  NEUTROABS 6.4  --   --   --   HGB 12.9* 13.9 12.4* 12.1*   HCT 40.2 41.0 37.8* 38.2*  MCV 87.6  --  86.1 85.8  PLT 279  --  273 250   Cardiac Enzymes: No results for input(s): CKTOTAL, CKMB, CKMBINDEX, TROPONINI in the last 168 hours. BNP: Invalid input(s): POCBNP CBG: Recent Labs  Lab 12/14/23 0731 12/14/23 1144 12/14/23 1643 12/14/23 2028 12/15/23 0711  GLUCAP 124* 162* 147* 200* 148*   D-Dimer No results for input(s): DDIMER in the last 72 hours. Hgb A1c No results for input(s): HGBA1C in the last 72 hours. Lipid Profile No results for input(s): CHOL, HDL, LDLCALC, TRIG, CHOLHDL, LDLDIRECT in the last 72 hours. Thyroid  function studies No results for input(s): TSH, T4TOTAL, T3FREE, THYROIDAB in the last 72 hours.  Invalid input(s): FREET3 Anemia work up No results for input(s): VITAMINB12, FOLATE, FERRITIN, TIBC, IRON, RETICCTPCT in the last 72 hours. Urinalysis    Component Value Date/Time   COLORURINE STRAW (A) 12/13/2023 1607   APPEARANCEUR CLEAR 12/13/2023 1607   LABSPEC 1.023 12/13/2023 1607   PHURINE 7.0 12/13/2023 1607   GLUCOSEU NEGATIVE 12/13/2023 1607   HGBUR NEGATIVE 12/13/2023 1607   BILIRUBINUR NEGATIVE 12/13/2023 1607   KETONESUR NEGATIVE 12/13/2023 1607   PROTEINUR NEGATIVE 12/13/2023 1607   NITRITE NEGATIVE 12/13/2023 1607   LEUKOCYTESUR MODERATE (A) 12/13/2023 1607   Sepsis Labs Recent Labs  Lab 12/13/23 1222 12/13/23 2120 12/14/23 0517  WBC 9.1 10.5 10.0   Microbiology Recent Results (from the past 240 hours)  Urine Culture     Status: Abnormal   Collection Time: 12/13/23  4:07 PM   Specimen: Urine, Random  Result Value Ref Range Status   Specimen Description   Final    URINE, RANDOM Performed at [facility], [address] [place], [place] [address]    Special Requests   Final    NONE Reflexed from [id number] Performed at [facility], [address] [place], [place] [address]    Culture 20,000  COLONIES/mL YEAST (A)  Final   Report Status 12/15/2023 FINAL  Final     Time coordinating discharge: Over 30 minutes  SIGNED:   Elsie JAYSON [place], DO [facility] 12/15/2023, 11:46 AM Pager   If 7PM-7AM, please contact night-coverage [email or web address]

## 2023-12-15 NOTE — TOC Transition Note (Signed)
 Transition of Care [facility]) - Discharge Note   Patient Details  Name: John [name] MRN: [id number] Date of Birth: 04-11-1946  Transition of Care [facility]) CM/SW Contact:  Jon ONEIDA Anon, RN Phone Number: 12/15/2023, 11:51 AM   Clinical Narrative:    Pt will discharge home with home health services through [facility]. Prior to admission pt was receiving [facility] RN services. Baker, rep with [facility] confirms. HH orders are in place. There are no DME needs at this time. Pt family will provide transportation at [place]. There are no other IP Care Management needs at this time.     Final next level of care: Home w Home Health Services Barriers to Discharge: No Barriers Identified   Patient Goals and CMS Choice Patient states their goals for this hospitalization and ongoing recovery are:: To return home and receive home health services CMS Medicare.gov Compare Post Acute Care list provided to:: Patient Represenative (must comment) (Pt spouse) Choice offered to / list presented to : Spouse Robeline ownership interest in [facility].provided to:: Spouse    Discharge Placement                       Discharge Plan and Services Additional resources added to the After Visit Summary for                  DME Arranged: N/A DME Agency: NA       HH Arranged: RN HH Agency: [facility] Date HH Agency Contacted: 12/15/23 Time HH Agency Contacted: [id number] Representative spoke with at [facility] Agency: Baker with [facility]  Social Drivers of Health (SDOH) Interventions SDOH Screenings   Food Insecurity: No Food Insecurity (12/05/2023)  Housing: Low Risk  (12/05/2023)  Transportation Needs: No Transportation Needs (12/05/2023)  Utilities: Not At Risk (12/05/2023)  Alcohol Screen: Low Risk  (01/27/2023)  Depression (PHQ2-9): Low Risk  (01/28/2023)  Financial Resource Strain: Low Risk  (10/03/2023)  Physical Activity: Inactive (10/03/2023)   Social Connections: Socially Isolated (12/05/2023)  Stress: No Stress Concern Present (10/03/2023)  Tobacco Use: Low Risk  (12/13/2023)     Readmission Risk Interventions    12/06/2023    4:03 PM 05/11/2023   12:52 PM 04/22/2023   12:12 PM  Readmission Risk Prevention Plan  Medication Screening   Complete  Transportation Screening Complete Complete Complete  PCP or Specialist Appt within 3-5 Days  Complete   HRI or Home Care Consult  Complete   Social Work Consult for Recovery Care Planning/Counseling  Complete   Palliative Care Screening  Not Applicable   Medication Review Oceanographer) Complete Complete   HRI or Home Care Consult Complete    SW Recovery Care/Counseling Consult Complete    Palliative Care Screening Not Applicable    Skilled Nursing Facility Not Applicable

## 2023-12-15 NOTE — Care Management Obs Status (Signed)
 MEDICARE OBSERVATION STATUS NOTIFICATION   Patient Details  Name: John [name] MRN: [id number] Date of Birth: 02/08/1946   Medicare Observation Status Notification Given:  Yes (Spoke with pt spouse [name] telephonically to deliver MOON as pt is alert to self only)    Jon ONEIDA Anon, RN 12/15/2023, 10:29 AM

## 2023-12-16 ENCOUNTER — Inpatient Hospital Stay: Admitting: Family Medicine

## 2023-12-16 NOTE — Telephone Encounter (Signed)
 ERROR

## 2023-12-17 NOTE — Telephone Encounter (Signed)
 Yes, no need for limited echo if he got TEE

## 2023-12-18 ENCOUNTER — Other Ambulatory Visit: Payer: Self-pay | Admitting: Family Medicine

## 2023-12-19 ENCOUNTER — Emergency Department (HOSPITAL_COMMUNITY)

## 2023-12-19 ENCOUNTER — Other Ambulatory Visit (HOSPITAL_BASED_OUTPATIENT_CLINIC_OR_DEPARTMENT_OTHER): Payer: Self-pay

## 2023-12-19 ENCOUNTER — Inpatient Hospital Stay (HOSPITAL_COMMUNITY)
Admission: EM | Admit: 2023-12-19 | Discharge: 2023-12-22 | DRG: 193 | Disposition: A | Discharge status: Home Health | Attending: Internal Medicine | Admitting: Internal Medicine

## 2023-12-19 ENCOUNTER — Other Ambulatory Visit: Payer: Self-pay

## 2023-12-19 ENCOUNTER — Ambulatory Visit: Payer: Self-pay

## 2023-12-19 ENCOUNTER — Other Ambulatory Visit (HOSPITAL_COMMUNITY): Payer: Self-pay

## 2023-12-19 ENCOUNTER — Other Ambulatory Visit: Payer: Self-pay | Admitting: Physician Assistant

## 2023-12-19 DIAGNOSIS — Z7984 Long term (current) use of oral hypoglycemic drugs: Secondary | ICD-10-CM | POA: Diagnosis not present

## 2023-12-19 DIAGNOSIS — K219 Gastro-esophageal reflux disease without esophagitis: Secondary | ICD-10-CM | POA: Diagnosis present

## 2023-12-19 DIAGNOSIS — E7800 Pure hypercholesterolemia, unspecified: Secondary | ICD-10-CM | POA: Diagnosis present

## 2023-12-19 DIAGNOSIS — Z794 Long term (current) use of insulin: Secondary | ICD-10-CM | POA: Diagnosis not present

## 2023-12-19 DIAGNOSIS — Z950 Presence of cardiac pacemaker: Secondary | ICD-10-CM | POA: Diagnosis not present

## 2023-12-19 DIAGNOSIS — T368X6A Underdosing of other systemic antibiotics, initial encounter: Secondary | ICD-10-CM | POA: Diagnosis present

## 2023-12-19 DIAGNOSIS — I6523 Occlusion and stenosis of bilateral carotid arteries: Secondary | ICD-10-CM | POA: Diagnosis not present

## 2023-12-19 DIAGNOSIS — G9341 Metabolic encephalopathy: Secondary | ICD-10-CM | POA: Diagnosis present

## 2023-12-19 DIAGNOSIS — E872 Acidosis, unspecified: Secondary | ICD-10-CM | POA: Diagnosis not present

## 2023-12-19 DIAGNOSIS — Z86718 Personal history of other venous thrombosis and embolism: Secondary | ICD-10-CM

## 2023-12-19 DIAGNOSIS — Z87440 Personal history of urinary (tract) infections: Secondary | ICD-10-CM

## 2023-12-19 DIAGNOSIS — R410 Disorientation, unspecified: Secondary | ICD-10-CM | POA: Diagnosis not present

## 2023-12-19 DIAGNOSIS — Z807 Family history of other malignant neoplasms of lymphoid, hematopoietic and related tissues: Secondary | ICD-10-CM

## 2023-12-19 DIAGNOSIS — Z9861 Coronary angioplasty status: Secondary | ICD-10-CM

## 2023-12-19 DIAGNOSIS — Z960 Presence of urogenital implants: Secondary | ICD-10-CM | POA: Diagnosis present

## 2023-12-19 DIAGNOSIS — F0283 Dementia in other diseases classified elsewhere, unspecified severity, with mood disturbance: Secondary | ICD-10-CM | POA: Diagnosis present

## 2023-12-19 DIAGNOSIS — Z91A28 Caregiver's intentional underdosing of medication regimen for other reason: Secondary | ICD-10-CM

## 2023-12-19 DIAGNOSIS — R531 Weakness: Secondary | ICD-10-CM | POA: Diagnosis not present

## 2023-12-19 DIAGNOSIS — C50911 Malignant neoplasm of unspecified site of right female breast: Secondary | ICD-10-CM | POA: Diagnosis present

## 2023-12-19 DIAGNOSIS — Z66 Do not resuscitate: Secondary | ICD-10-CM | POA: Diagnosis present

## 2023-12-19 DIAGNOSIS — J189 Pneumonia, unspecified organism: Secondary | ICD-10-CM | POA: Diagnosis not present

## 2023-12-19 DIAGNOSIS — I5020 Unspecified systolic (congestive) heart failure: Secondary | ICD-10-CM | POA: Diagnosis not present

## 2023-12-19 DIAGNOSIS — R197 Diarrhea, unspecified: Secondary | ICD-10-CM | POA: Diagnosis present

## 2023-12-19 DIAGNOSIS — Z823 Family history of stroke: Secondary | ICD-10-CM

## 2023-12-19 DIAGNOSIS — B955 Unspecified streptococcus as the cause of diseases classified elsewhere: Secondary | ICD-10-CM | POA: Diagnosis present

## 2023-12-19 DIAGNOSIS — E861 Hypovolemia: Secondary | ICD-10-CM | POA: Diagnosis present

## 2023-12-19 DIAGNOSIS — K8020 Calculus of gallbladder without cholecystitis without obstruction: Secondary | ICD-10-CM | POA: Diagnosis present

## 2023-12-19 DIAGNOSIS — Z7902 Long term (current) use of antithrombotics/antiplatelets: Secondary | ICD-10-CM

## 2023-12-19 DIAGNOSIS — Z23 Encounter for immunization: Secondary | ICD-10-CM

## 2023-12-19 DIAGNOSIS — R4189 Other symptoms and signs involving cognitive functions and awareness: Secondary | ICD-10-CM | POA: Diagnosis not present

## 2023-12-19 DIAGNOSIS — E1165 Type 2 diabetes mellitus with hyperglycemia: Secondary | ICD-10-CM | POA: Diagnosis not present

## 2023-12-19 DIAGNOSIS — G309 Alzheimer's disease, unspecified: Secondary | ICD-10-CM | POA: Diagnosis present

## 2023-12-19 DIAGNOSIS — Z91048 Other nonmedicinal substance allergy status: Secondary | ICD-10-CM

## 2023-12-19 DIAGNOSIS — I2510 Atherosclerotic heart disease of native coronary artery without angina pectoris: Secondary | ICD-10-CM | POA: Diagnosis not present

## 2023-12-19 DIAGNOSIS — Z888 Allergy status to other drugs, medicaments and biological substances status: Secondary | ICD-10-CM

## 2023-12-19 DIAGNOSIS — Z781 Physical restraint status: Secondary | ICD-10-CM

## 2023-12-19 DIAGNOSIS — Z7901 Long term (current) use of anticoagulants: Secondary | ICD-10-CM | POA: Diagnosis not present

## 2023-12-19 DIAGNOSIS — N401 Enlarged prostate with lower urinary tract symptoms: Secondary | ICD-10-CM | POA: Diagnosis present

## 2023-12-19 DIAGNOSIS — Z8041 Family history of malignant neoplasm of ovary: Secondary | ICD-10-CM

## 2023-12-19 DIAGNOSIS — Z9841 Cataract extraction status, right eye: Secondary | ICD-10-CM

## 2023-12-19 DIAGNOSIS — I110 Hypertensive heart disease with heart failure: Secondary | ICD-10-CM | POA: Diagnosis present

## 2023-12-19 DIAGNOSIS — R7881 Bacteremia: Secondary | ICD-10-CM | POA: Diagnosis not present

## 2023-12-19 DIAGNOSIS — R112 Nausea with vomiting, unspecified: Secondary | ICD-10-CM | POA: Diagnosis present

## 2023-12-19 DIAGNOSIS — F325 Major depressive disorder, single episode, in full remission: Secondary | ICD-10-CM | POA: Diagnosis present

## 2023-12-19 DIAGNOSIS — Z79899 Other long term (current) drug therapy: Secondary | ICD-10-CM

## 2023-12-19 DIAGNOSIS — I5022 Chronic systolic (congestive) heart failure: Secondary | ICD-10-CM | POA: Diagnosis present

## 2023-12-19 DIAGNOSIS — G9389 Other specified disorders of brain: Secondary | ICD-10-CM | POA: Diagnosis not present

## 2023-12-19 DIAGNOSIS — I6782 Cerebral ischemia: Secondary | ICD-10-CM | POA: Diagnosis not present

## 2023-12-19 DIAGNOSIS — Z833 Family history of diabetes mellitus: Secondary | ICD-10-CM

## 2023-12-19 DIAGNOSIS — Z9221 Personal history of antineoplastic chemotherapy: Secondary | ICD-10-CM

## 2023-12-19 DIAGNOSIS — R4182 Altered mental status, unspecified: Secondary | ICD-10-CM | POA: Diagnosis not present

## 2023-12-19 DIAGNOSIS — Z8673 Personal history of transient ischemic attack (TIA), and cerebral infarction without residual deficits: Secondary | ICD-10-CM

## 2023-12-19 DIAGNOSIS — Z8619 Personal history of other infectious and parasitic diseases: Secondary | ICD-10-CM

## 2023-12-19 DIAGNOSIS — R110 Nausea: Secondary | ICD-10-CM | POA: Diagnosis not present

## 2023-12-19 DIAGNOSIS — Z9011 Acquired absence of right breast and nipple: Secondary | ICD-10-CM

## 2023-12-19 DIAGNOSIS — R5381 Other malaise: Secondary | ICD-10-CM | POA: Diagnosis present

## 2023-12-19 DIAGNOSIS — Z9842 Cataract extraction status, left eye: Secondary | ICD-10-CM

## 2023-12-19 DIAGNOSIS — Z86711 Personal history of pulmonary embolism: Secondary | ICD-10-CM

## 2023-12-19 DIAGNOSIS — Z955 Presence of coronary angioplasty implant and graft: Secondary | ICD-10-CM

## 2023-12-19 DIAGNOSIS — N390 Urinary tract infection, site not specified: Secondary | ICD-10-CM | POA: Diagnosis not present

## 2023-12-19 DIAGNOSIS — Z87892 Personal history of anaphylaxis: Secondary | ICD-10-CM

## 2023-12-19 DIAGNOSIS — K828 Other specified diseases of gallbladder: Secondary | ICD-10-CM | POA: Diagnosis present

## 2023-12-19 DIAGNOSIS — I10 Essential (primary) hypertension: Secondary | ICD-10-CM | POA: Diagnosis not present

## 2023-12-19 DIAGNOSIS — Z853 Personal history of malignant neoplasm of breast: Secondary | ICD-10-CM

## 2023-12-19 LAB — CBC
HCT: 37.9 % — ABNORMAL LOW (ref 39.0–52.0)
Hemoglobin: 12.1 g/dL — ABNORMAL LOW (ref 13.0–17.0)
MCH: 28 pg (ref 26.0–34.0)
MCHC: 31.9 g/dL (ref 30.0–36.0)
MCV: 87.7 fL (ref 80.0–100.0)
Platelets: 235 K/uL (ref 150–400)
RBC: 4.32 MIL/uL (ref 4.22–5.81)
RDW: 19.2 % — ABNORMAL HIGH (ref 11.5–15.5)
WBC: 12.9 K/uL — ABNORMAL HIGH (ref 4.0–10.5)
nRBC: 0 % (ref 0.0–0.2)

## 2023-12-19 LAB — HEPATIC FUNCTION PANEL
ALT: 33 U/L (ref 0–44)
AST: 30 U/L (ref 15–41)
Albumin: 3.9 g/dL (ref 3.5–5.0)
Alkaline Phosphatase: 70 U/L (ref 38–126)
Bilirubin, Direct: 0.8 mg/dL — ABNORMAL HIGH (ref 0.0–0.2)
Indirect Bilirubin: 1.4 mg/dL — ABNORMAL HIGH (ref 0.3–0.9)
Total Bilirubin: 2.2 mg/dL — ABNORMAL HIGH (ref 0.0–1.2)
Total Protein: 6.1 g/dL — ABNORMAL LOW (ref 6.5–8.1)

## 2023-12-19 LAB — BASIC METABOLIC PANEL WITH GFR
Anion gap: 19 — ABNORMAL HIGH (ref 5–15)
BUN: 15 mg/dL (ref 8–23)
CO2: 18 mmol/L — ABNORMAL LOW (ref 22–32)
Calcium: 10.2 mg/dL (ref 8.9–10.3)
Chloride: 98 mmol/L (ref 98–111)
Creatinine, Ser: 0.99 mg/dL (ref 0.61–1.24)
GFR, Estimated: >60 mL/min (ref >60)
Glucose, Bld: 167 mg/dL — ABNORMAL HIGH (ref 70–99)
Potassium: 4.2 mmol/L (ref 3.5–5.1)
Sodium: 134 mmol/L — ABNORMAL LOW (ref 135–145)

## 2023-12-19 LAB — LACTIC ACID, PLASMA
Lactic Acid, Venous: 4.1 mmol/L (ref 0.5–1.9)
Lactic Acid, Venous: 3.4 mmol/L (ref 0.5–1.9)

## 2023-12-19 LAB — AMMONIA: Ammonia: 18 umol/L (ref 9–35)

## 2023-12-19 MED ORDER — METRONIDAZOLE 500 MG/100ML IV SOLN
500.0000 mg | Freq: Once | INTRAVENOUS | Status: AC
  Administered 2023-12-19: 500 mg via INTRAVENOUS
  Filled 2023-12-19: qty 100

## 2023-12-19 MED ORDER — ONDANSETRON HCL 4 MG/2ML IJ SOLN
4.0000 mg | Freq: Four times a day (QID) | INTRAMUSCULAR | Status: DC | PRN
  Administered 2023-12-19: 4 mg via INTRAVENOUS
  Filled 2023-12-19: qty 2

## 2023-12-19 MED ORDER — IOHEXOL 300 MG/ML  SOLN
100.0000 mL | Freq: Once | INTRAMUSCULAR | Status: AC | PRN
  Administered 2023-12-19: 100 mL via INTRAVENOUS

## 2023-12-19 MED ORDER — VANCOMYCIN HCL IN DEXTROSE 1-5 GM/200ML-% IV SOLN: 1000.0000 mg | Freq: Once | INTRAVENOUS | Status: DC

## 2023-12-19 MED ORDER — VANCOMYCIN HCL 1750 MG/350ML IV SOLN
1750.0000 mg | Freq: Once | INTRAVENOUS | Status: AC
  Administered 2023-12-20: 1750 mg via INTRAVENOUS
  Filled 2023-12-19: qty 350

## 2023-12-19 MED ORDER — LACTATED RINGERS IV BOLUS
1000.0000 mL | Freq: Once | INTRAVENOUS | Status: AC
  Administered 2023-12-19: 1000 mL via INTRAVENOUS

## 2023-12-19 MED ORDER — PANTOPRAZOLE SODIUM 40 MG PO TBEC
40.0000 mg | DELAYED_RELEASE_TABLET | Freq: Every day | ORAL | 0 refills | Status: DC
  Filled 2023-12-19: qty 30, 30d supply, fill #0

## 2023-12-19 MED ORDER — SODIUM CHLORIDE 0.9 % IV SOLN
2.0000 g | Freq: Once | INTRAVENOUS | Status: AC
  Administered 2023-12-19: 2 g via INTRAVENOUS
  Filled 2023-12-19: qty 12.5

## 2023-12-19 MED ORDER — LACTATED RINGERS IV BOLUS (SEPSIS)
1000.0000 mL | Freq: Once | INTRAVENOUS | Status: AC
  Administered 2023-12-19: 1000 mL via INTRAVENOUS

## 2023-12-19 NOTE — Progress Notes (Signed)
Unit staff able to establish PIV

## 2023-12-19 NOTE — ED Notes (Signed)
 IV access attempted x2, unsuccessful. IV team consult placed.

## 2023-12-19 NOTE — Telephone Encounter (Signed)
 FYI Only or Action Required?: Action required by provider: request for appointment.  Patient was last seen in primary care on 11/11/2023 by [name], DO.  Called Nurse Triage reporting Diarrhea.  Symptoms began several days ago.  Interventions attempted: OTC medications: imodium  .  Symptoms are: gradually worsening.  Triage Disposition: See PCP When Office is Open (Within 3 Days)  Patient/caregiver understands and will follow disposition?: YesCopied from CRM #[id number]. Topic: Clinical - Red Word Triage >> Dec 19, 2023 10:17 AM Aleatha C wrote: Red Word that prompted transfer to Nurse Triage: diarrhea for 3 days and can not hold anything down Reason for Disposition  [1] MILD diarrhea (e.g., 1-3 or more stools than normal in past 24 hours) AND [2] present >  7 days  (Exception: Chronic diarrhea that is not worse.)  Answer Assessment - Initial Assessment Questions Pt has discharged from hospital on 9/4. Pt has 7 capsule of Linezolid  left from 8/29.Pt has catheter. Weaker than normal. RN called CAL with acute issue unrelated to hospital visit. CAL sated to sent CRM and assistant will speak to PCP and decide plan of care.    1. DIARRHEA SEVERITY: How bad is the diarrhea? How many more stools have you had in the past 24 hours than normal?      2 2. ONSET: When did the diarrhea begin?      Over weekend 3. STOOL DESCRIPTION:  How loose or watery is the diarrhea? What is the stool color? Is there any blood or mucous in the stool?     unsure 4. VOMITING: Are you also vomiting? If Yes, ask: How many times in the past 24 hours?      2 5. ABDOMEN PAIN: Are you having any abdomen pain? If Yes, ask: What does it feel like? (e.g., crampy, dull, intermittent, constant)      denies 6. ABDOMEN PAIN SEVERITY: If present, ask: How bad is the pain?  (e.g., Scale 1-10; mild, moderate, or severe)     denies 7. ORAL INTAKE: If vomiting, Have you been able to drink  liquids? How much liquids have you had in the past 24 hours?     water 8. HYDRATION: Any signs of dehydration? (e.g., dry mouth [not just dry lips], too weak to stand, dizziness, new weight loss) When did you last urinate?     Weaker than normal 9. EXPOSURE: Have you traveled to a foreign country recently? Have you been exposed to anyone with diarrhea? Could you have eaten any food that was spoiled?     na 10. ANTIBIOTIC USE: Are you taking antibiotics now or have you taken antibiotics in the past 2 months?       Linezolide  11. OTHER SYMPTOMS: Do you have any other symptoms? (e.g., fever, blood in stool)       Nausea/vomiting Weak-uses walker  Protocols used: Diarrhea-A-AH

## 2023-12-19 NOTE — ED Provider Notes (Signed)
 [place] [facility] AT [facility] Provider Note   CSN: [id number] Arrival date & time: 12/19/23  1552     Patient presents with: No chief complaint on file.   John [name] is a 78 y.o. male.   HPI Patient presents for nausea, vomiting, generalized weakness.  Medical history includes DM, HTN, GERD, HLD, CVA, neuropathy, BPH, cognitive impairment.  2 weeks ago, he was admitted to the hospital for sepsis secondary to UTI and bacteremia.  He was discharged on 8/30 with plan for continue Zyvox  for the following 9 days.  He subsequently presented to the ED 3 days later for encephalopathy.  He was again admitted and discharged 2 days later with plan to continue linezolid  at home.  This was 4 days ago.  For the past several days, he has been having ongoing nausea, vomiting, and generalized weakness.  Family reports intermittent altered mental status, similar to time of prior hospitalization.  Patient himself, states that he has been having nausea and vomiting.  He denies any current nausea.  He denies any areas of discomfort.  Family reports that they were advised to discontinue antibiotics today.      Prior to Admission medications   Medication Sig Start Date End Date Taking? Authorizing Provider  acetaminophen  (TYLENOL ) 650 MG CR tablet Take 1 tablet (650 mg total) by mouth every 8 (eight) hours as needed for pain. 05/14/23   Zhao, Xika, NP  Ascorbic Acid  (VITAMIN C) 1000 MG tablet Take 1,000 mg by mouth 3 (three) times a week.    [provider]  atorvastatin  (LIPITOR ) 80 MG tablet Take 1 tablet (80 mg total) by mouth at bedtime. 09/14/23   [name], DO  clopidogrel  (PLAVIX ) 75 MG tablet Take 1 tablet (75 mg total) by mouth daily with breakfast. 10/11/23   Wendling, Mabel Mt, DO  clotrimazole -betamethasone  (LOTRISONE ) cream Apply 1 Application topically daily. Patient taking differently: Apply 1 Application topically daily as needed (rash).  07/11/23   [name], DO  cyanocobalamin  1000 MCG tablet Take 1 tablet (1,000 mcg total) by mouth daily. 09/14/23   [name], DO  escitalopram  (LEXAPRO ) 10 MG tablet Take 1 tablet (10 mg total) by mouth daily. 10/03/23   [name], DO  ferrous sulfate  (FEROSUL) 325 (65 FE) MG tablet Take 1 tablet (325 mg total) by mouth daily with breakfast. 11/28/23   Frann, Mabel Mt, DO  finasteride  (PROSCAR ) 5 MG tablet Take 1 tablet (5 mg total) by mouth daily. *Need appointment for future refills.* 11/28/23   Wendling, Mabel Mt, DO  hyoscyamine  (LEVSIN  SL) 0.125 MG SL tablet Place 1 tablet (0.125 mg total) under the tongue every 4 (four) hours as needed for bladder spasms. 08/17/23   Angiulli, Toribio PARAS, PA-C  insulin  glargine (LANTUS ) 100 UNIT/ML injection Inject 0.15 mLs (15 Units total) into the skin daily for 7 days. 11/11/23 12/05/23  [name], DO  Magnesium  Citrate 200 MG TABS Take 200 mg by mouth 2 (two) times daily before a meal. 12/15/23   Lue [name], MD  metFORMIN  (GLUCOPHAGE ) 500 MG tablet Take 1 tablet (500 mg total) by mouth 2 (two) times daily with a meal. 09/14/23   Wendling, Mabel Mt, DO  Multiple Vitamin (MULTIVITAMIN) tablet Take 1 tablet by mouth 2 (two) times a week.    [provider]  pantoprazole  (PROTONIX ) 40 MG tablet Take 1 tablet (40 mg total) by mouth at bedtime. 12/19/23   [name],  DO  senna-docusate (SENOKOT-S) 8.6-50 MG tablet Take 1 tablet by mouth 2 (two) times daily. 12/15/23   Lue [name], MD  amLODipine  (NORVASC ) 2.5 MG tablet Take 1 tablet (2.5 mg total) by mouth daily. 06/15/23 06/30/23  [name], DO  apixaban  (ELIQUIS ) 5 MG TABS tablet Take 1 tablet (5 mg total) by mouth 2 (two) times daily. 10/11/23 10/11/23  [name], DO    Allergies: Ramipril, Adhesive [tape], Other, and Sertraline    Review of Systems  Gastrointestinal:  Positive for nausea and  vomiting.  Psychiatric/Behavioral:  Positive for confusion.   All other systems reviewed and are negative.   Updated Vital Signs BP (!) 136/104   Pulse 82   Temp 98.2 F (36.8 C) (Oral)   Resp 18   SpO2 97%   Physical Exam Vitals and nursing note reviewed.  Constitutional:      General: He is not in acute distress.    Appearance: Normal appearance. He is well-developed. He is not ill-appearing, toxic-appearing or diaphoretic.  HENT:     Head: Normocephalic and atraumatic.     Right Ear: External ear normal.     Left Ear: External ear normal.     Nose: Nose normal.     Mouth/Throat:     Mouth: Mucous membranes are moist.  Eyes:     Extraocular Movements: Extraocular movements intact.     Conjunctiva/sclera: Conjunctivae normal.  Cardiovascular:     Rate and Rhythm: Normal rate and regular rhythm.  Pulmonary:     Effort: Pulmonary effort is normal. No respiratory distress.  Abdominal:     General: There is no distension.     Palpations: Abdomen is soft.     Tenderness: There is no abdominal tenderness.  Musculoskeletal:        General: No swelling. Normal range of motion.     Cervical back: Normal range of motion and neck supple.  Skin:    General: Skin is warm and dry.     Coloration: Skin is not jaundiced or pale.  Neurological:     General: No focal deficit present.     Mental Status: He is alert. He is disoriented.  Psychiatric:        Mood and Affect: Mood normal.        Behavior: Behavior normal.     (all labs ordered are listed, but only abnormal results are displayed) Labs Reviewed  BASIC METABOLIC PANEL WITH GFR - Abnormal; Notable for the following components:      Result Value   Sodium 134 (*)    CO2 18 (*)    Glucose, Bld 167 (*)    Anion gap 19 (*)    All other components within normal limits  LACTIC ACID, PLASMA - Abnormal; Notable for the following components:   Lactic Acid, Venous 4.1 (*)    All other components within normal limits  LACTIC  ACID, PLASMA - Abnormal; Notable for the following components:   Lactic Acid, Venous 3.4 (*)    All other components within normal limits  HEPATIC FUNCTION PANEL - Abnormal; Notable for the following components:   Total Protein 6.1 (*)    Total Bilirubin 2.2 (*)    Bilirubin, Direct 0.8 (*)    Indirect Bilirubin 1.4 (*)    All other components within normal limits  CBC - Abnormal; Notable for the following components:   WBC 12.9 (*)    Hemoglobin 12.1 (*)    HCT 37.9 (*)  RDW 19.2 (*)    All other components within normal limits  URINE CULTURE  CULTURE, BLOOD (ROUTINE X 2)  CULTURE, BLOOD (ROUTINE X 2)  AMMONIA  URINALYSIS, ROUTINE W REFLEX MICROSCOPIC    EKG: None  Radiology: CT ABDOMEN PELVIS W CONTRAST Result Date: 12/19/2023 CLINICAL DATA:  Abdomen pain nausea vomiting EXAM: CT ABDOMEN AND PELVIS WITH CONTRAST TECHNIQUE: Multidetector CT imaging of the abdomen and pelvis was performed using the standard protocol following bolus administration of intravenous contrast. RADIATION DOSE REDUCTION: This exam was performed according to the departmental dose-optimization program which includes automated exposure control, adjustment of the mA and/or kV according to patient size and/or use of iterative reconstruction technique. CONTRAST:  OMNIPAQUE  IOHEXOL  300 MG/ML  SOLN COMPARISON:  CT 12/13/2023, 12/04/2023 FINDINGS: Lower chest: Lung bases demonstrate slight increased patchy airspace disease at the left base. Partially visualized cardiac pacing leads. Coronary vascular calcification. Hepatobiliary: Distended gallbladder with stones. No biliary dilatation or focal hepatic abnormality. Pancreas: Atrophic.  No inflammation or ductal dilatation Spleen: Granuloma Adrenals/Urinary Tract: Adrenal glands are normal. Kidneys show no hydronephrosis. Decompressed diffusely thick walled bladder with catheter. Stomach/Bowel: Stomach within normal limits. No dilated small bowel. No acute bowel wall  thickening. Vascular/Lymphatic: Aortic atherosclerosis. No enlarged abdominal or pelvic lymph nodes. Reproductive: Enlarged prostate Other: Negative for pelvic effusion or free air. Musculoskeletal: No acute or suspicious osseous abnormality IMPRESSION: 1. No CT evidence for acute intra-abdominal or pelvic abnormality. 2. Distended gallbladder with stones. 3. Slight increased patchy airspace disease at the left base, atelectasis versus minimal pneumonia. 4. Enlarged prostate. Decompressed urinary bladder but with marked diffuse bladder wall thickening 5. Aortic atherosclerosis. Aortic Atherosclerosis (ICD10-I70.0). Electronically Signed   By: Luke Bun M.D.   On: 12/19/2023 22:30   CT Head Wo Contrast Result Date: 12/19/2023 CLINICAL DATA:  Intermittent altered mental status for 2 weeks. EXAM: CT HEAD WITHOUT CONTRAST TECHNIQUE: Contiguous axial images were obtained from the base of the skull through the vertex without intravenous contrast. RADIATION DOSE REDUCTION: This exam was performed according to the departmental dose-optimization program which includes automated exposure control, adjustment of the mA and/or kV according to patient size and/or use of iterative reconstruction technique. COMPARISON:  December 13, 2023 FINDINGS: Brain: There is generalize cerebral atrophy with widening of the extra-axial spaces and ventricular dilatation. There are areas of decreased attenuation within the white matter tracts of the supratentorial brain, consistent with microvascular disease changes. Small, chronic bilateral basal ganglia lacunar infarcts are noted. Vascular: Marked severity bilateral cavernous carotid artery calcification is seen. Skull: Normal. Negative for fracture or focal lesion. Sinuses/Orbits: No acute finding. Other: None. IMPRESSION: 1. Generalized cerebral atrophy with chronic white matter small vessel ischemic changes. 2. Small, chronic bilateral basal ganglia lacunar infarcts. 3. No acute  intracranial abnormality. Electronically Signed   By: Suzen Dials M.D.   On: 12/19/2023 17:50     Procedures   Medications Ordered in the ED  ondansetron  (ZOFRAN ) injection 4 mg (4 mg Intravenous Given 12/19/23 2025)  lactated ringers  bolus 1,000 mL (1,000 mLs Intravenous New Bag/Given 12/19/23 2307)  metroNIDAZOLE  (FLAGYL ) IVPB 500 mg (500 mg Intravenous New Bag/Given 12/19/23 2309)  vancomycin  (VANCOREADY) IVPB 1750 mg/350 mL (has no administration in time range)  lactated ringers  bolus 1,000 mL (0 mLs Intravenous Stopped 12/19/23 2139)  iohexol  (OMNIPAQUE ) 300 MG/ML solution 100 mL (100 mLs Intravenous Contrast Given 12/19/23 2205)  ceFEPIme  (MAXIPIME ) 2 g in sodium chloride  0.9 % 100 mL IVPB (0 g Intravenous Stopped 12/19/23  2344)                                    Medical Decision Making Amount and/or Complexity of Data Reviewed Labs: ordered. Radiology: ordered.  Risk Prescription drug management. Decision regarding hospitalization.   This patient presents to the ED for concern of nausea, vomiting, altered mental status, this involves an extensive number of treatment options, and is a complaint that carries with it a high risk of complications and morbidity.  The differential diagnosis includes infection, polypharmacy, obstruction, GERD   Co morbidities / Chronic conditions that complicate the patient evaluation  DM, HTN, GERD, HLD, CVA, neuropathy, BPH, cognitive impairment   Additional history obtained:  Additional history obtained from EMR External records from outside source obtained and reviewed including patient's son   Lab Tests:  I Ordered, and personally interpreted labs.  The pertinent results include: Leukocytosis lactic acidosis concern for sepsis; normal kidney function, normal electrolytes   Imaging Studies ordered:  I ordered imaging studies including CT head, CT of abdomen and pelvis I independently visualized and interpreted imaging which showed  cholelithiasis, left lower lobe patchy airspace disease concerning for pneumonia, pleural thickening concerning for cystitis I agree with the radiologist interpretation   Cardiac Monitoring: / EKG:  The patient was maintained on a cardiac monitor.  I personally viewed and interpreted the cardiac monitored which showed an underlying rhythm of: Sinus rhythm   Problem List / ED Course / Critical interventions / Medication management  Patient presenting for family concerns of recent nausea, vomiting, as well as intermittent altered mental status.  He has been hospitalized twice over the past 2 weeks and has been undergoing treatment for UTI and bacteremia.  He was most recently discharged 4 days ago with plan to continue linezolid  at home.  Has reportedly been unable to tolerate oral medications.  Vital signs on arrival notable for hypertension.  Patient is overall well-appearing on exam.  He is disoriented.  He denies any complaints at this time but does state that he has been having some recent nausea and vomiting.  Abdomen is soft and nontender.  IV fluids were ordered for hydration.  As needed Zofran  was ordered.  Workup was initiated.  Laboratories leukocytosis and lactic acidosis.  This does raise concern for sepsis.  Broad-spectrum antibiotics and additional IV fluids were ordered.  Repeat lactate shows downtrend.  Imaging studies.  Concern for possible left lower lobe pneumonia in addition to concern for cystitis.  Patient to be admitted for further management. I ordered medication including IV fluids and broad-spectrum antibiotics for empiric treatment of sepsis; Zofran  for nausea Reevaluation of the patient after these medicines showed that the patient improved I have reviewed the patients home medicines and have made adjustments as needed   Social Determinants of Health:  Lives at home with family, frequent hospitalization     Final diagnoses:  Delirium  Nausea and vomiting,  unspecified vomiting type    ED Discharge Orders     None          Melvenia Motto, MD 12/19/23 2346

## 2023-12-19 NOTE — Telephone Encounter (Addendum)
 Wife calling back in. She states he is currently not vomiting or having diarrhea but he is not eating. She states when he tries to eat he throws up. She states she has been able to get him to drink water. She is asking if patient can be seen by the PCP and she does not want to take him to the hospital as he has been in and out for the last few weeks. Called [facility] and confirmed no sooner appts until Wednesday (patient currently booked for Wednesday); requested send message to clinic and they will forward to Dr Frann to advise. Attempted to notify wife of plan of care and audio issue. Attempted to call back and left voicemail notifying no sooner appts and she should expect a call back from the clinic once Dr Frann advises. Wife returned call and  requests a call back from clinic at the following [phone]. She also states patient began vomiting again and advised her to take patient to ED due to not tolerate PO fluids/food and began vomiting again.

## 2023-12-19 NOTE — Telephone Encounter (Signed)
 1140ish tomorrow.

## 2023-12-19 NOTE — ED Triage Notes (Signed)
 Patient BIB EMS for UTI, N/V. Patient has been on antibiotic therapy for past 2 weeks but states meds have made him sick. Family stopped meds. Patient is still having N/V and weakness. Patient is alert and oriented x 4 but family states patient has had some intermittent AMS for past 2 weeks. Patient has foley cath and draining at triage.

## 2023-12-20 ENCOUNTER — Encounter (HOSPITAL_COMMUNITY): Payer: Self-pay | Admitting: Family Medicine

## 2023-12-20 ENCOUNTER — Other Ambulatory Visit: Payer: Self-pay

## 2023-12-20 DIAGNOSIS — G309 Alzheimer's disease, unspecified: Secondary | ICD-10-CM | POA: Diagnosis present

## 2023-12-20 DIAGNOSIS — E872 Acidosis, unspecified: Secondary | ICD-10-CM | POA: Diagnosis present

## 2023-12-20 DIAGNOSIS — I2510 Atherosclerotic heart disease of native coronary artery without angina pectoris: Secondary | ICD-10-CM | POA: Diagnosis present

## 2023-12-20 DIAGNOSIS — Z7901 Long term (current) use of anticoagulants: Secondary | ICD-10-CM | POA: Diagnosis not present

## 2023-12-20 DIAGNOSIS — Z950 Presence of cardiac pacemaker: Secondary | ICD-10-CM | POA: Diagnosis not present

## 2023-12-20 DIAGNOSIS — Z9221 Personal history of antineoplastic chemotherapy: Secondary | ICD-10-CM | POA: Diagnosis not present

## 2023-12-20 DIAGNOSIS — I110 Hypertensive heart disease with heart failure: Secondary | ICD-10-CM | POA: Diagnosis present

## 2023-12-20 DIAGNOSIS — R7881 Bacteremia: Secondary | ICD-10-CM | POA: Diagnosis present

## 2023-12-20 DIAGNOSIS — Z781 Physical restraint status: Secondary | ICD-10-CM | POA: Diagnosis not present

## 2023-12-20 DIAGNOSIS — J189 Pneumonia, unspecified organism: Secondary | ICD-10-CM | POA: Diagnosis present

## 2023-12-20 DIAGNOSIS — E7800 Pure hypercholesterolemia, unspecified: Secondary | ICD-10-CM | POA: Diagnosis present

## 2023-12-20 DIAGNOSIS — Z23 Encounter for immunization: Secondary | ICD-10-CM | POA: Diagnosis present

## 2023-12-20 DIAGNOSIS — N401 Enlarged prostate with lower urinary tract symptoms: Secondary | ICD-10-CM | POA: Diagnosis present

## 2023-12-20 DIAGNOSIS — Z794 Long term (current) use of insulin: Secondary | ICD-10-CM | POA: Diagnosis not present

## 2023-12-20 DIAGNOSIS — Z7902 Long term (current) use of antithrombotics/antiplatelets: Secondary | ICD-10-CM | POA: Diagnosis not present

## 2023-12-20 DIAGNOSIS — Z66 Do not resuscitate: Secondary | ICD-10-CM | POA: Diagnosis present

## 2023-12-20 DIAGNOSIS — I5022 Chronic systolic (congestive) heart failure: Secondary | ICD-10-CM | POA: Diagnosis present

## 2023-12-20 DIAGNOSIS — B955 Unspecified streptococcus as the cause of diseases classified elsewhere: Secondary | ICD-10-CM | POA: Diagnosis present

## 2023-12-20 DIAGNOSIS — Z7984 Long term (current) use of oral hypoglycemic drugs: Secondary | ICD-10-CM | POA: Diagnosis not present

## 2023-12-20 DIAGNOSIS — F0283 Dementia in other diseases classified elsewhere, unspecified severity, with mood disturbance: Secondary | ICD-10-CM | POA: Diagnosis present

## 2023-12-20 DIAGNOSIS — G9341 Metabolic encephalopathy: Secondary | ICD-10-CM | POA: Diagnosis present

## 2023-12-20 DIAGNOSIS — E1165 Type 2 diabetes mellitus with hyperglycemia: Secondary | ICD-10-CM | POA: Diagnosis present

## 2023-12-20 DIAGNOSIS — E861 Hypovolemia: Secondary | ICD-10-CM | POA: Diagnosis present

## 2023-12-20 DIAGNOSIS — F325 Major depressive disorder, single episode, in full remission: Secondary | ICD-10-CM | POA: Diagnosis present

## 2023-12-20 LAB — CBC
HCT: 34.3 % — ABNORMAL LOW (ref 39.0–52.0)
Hemoglobin: 11.1 g/dL — ABNORMAL LOW (ref 13.0–17.0)
MCH: 28.8 pg (ref 26.0–34.0)
MCHC: 32.4 g/dL (ref 30.0–36.0)
MCV: 88.9 fL (ref 80.0–100.0)
Platelets: 197 K/uL (ref 150–400)
RBC: 3.86 MIL/uL — ABNORMAL LOW (ref 4.22–5.81)
RDW: 19.3 % — ABNORMAL HIGH (ref 11.5–15.5)
WBC: 12.3 K/uL — ABNORMAL HIGH (ref 4.0–10.5)
nRBC: 0 % (ref 0.0–0.2)

## 2023-12-20 LAB — URINALYSIS, ROUTINE W REFLEX MICROSCOPIC
Bilirubin Urine: NEGATIVE
Glucose, UA: NEGATIVE mg/dL
Hgb urine dipstick: NEGATIVE
Ketones, ur: 5 mg/dL — AB
Nitrite: NEGATIVE
Protein, ur: 30 mg/dL — AB
Specific Gravity, Urine: 1.02 (ref 1.005–1.030)
WBC, UA: >50 WBC/hpf (ref 0–5)
pH: 5 (ref 5.0–8.0)

## 2023-12-20 LAB — GLUCOSE, CAPILLARY
Glucose-Capillary: 181 mg/dL — ABNORMAL HIGH (ref 70–99)
Glucose-Capillary: 162 mg/dL — ABNORMAL HIGH (ref 70–99)

## 2023-12-20 LAB — URINE CULTURE: Culture: >=100000 — AB

## 2023-12-20 LAB — BASIC METABOLIC PANEL WITH GFR
Anion gap: 15 (ref 5–15)
BUN: 13 mg/dL (ref 8–23)
CO2: 21 mmol/L — ABNORMAL LOW (ref 22–32)
Calcium: 9.3 mg/dL (ref 8.9–10.3)
Chloride: 100 mmol/L (ref 98–111)
Creatinine, Ser: 0.78 mg/dL (ref 0.61–1.24)
GFR, Estimated: >60 mL/min (ref >60)
Glucose, Bld: 159 mg/dL — ABNORMAL HIGH (ref 70–99)
Potassium: 3.9 mmol/L (ref 3.5–5.1)
Sodium: 135 mmol/L (ref 135–145)

## 2023-12-20 LAB — CBG MONITORING, ED
Glucose-Capillary: 129 mg/dL — ABNORMAL HIGH (ref 70–99)
Glucose-Capillary: 161 mg/dL — ABNORMAL HIGH (ref 70–99)
Glucose-Capillary: 200 mg/dL — ABNORMAL HIGH (ref 70–99)

## 2023-12-20 LAB — LACTIC ACID, PLASMA: Lactic Acid, Venous: 1.6 mmol/L (ref 0.5–1.9)

## 2023-12-20 MED ORDER — INFLUENZA VAC SPLIT HIGH-DOSE 0.5 ML IM SUSY
0.5000 mL | PREFILLED_SYRINGE | INTRAMUSCULAR | Status: AC
  Administered 2023-12-22: 0.5 mL via INTRAMUSCULAR
  Filled 2023-12-20: qty 0.5

## 2023-12-20 MED ORDER — PANTOPRAZOLE SODIUM 40 MG PO TBEC
40.0000 mg | DELAYED_RELEASE_TABLET | Freq: Every day | ORAL | Status: DC
  Administered 2023-12-20 – 2023-12-21 (×2): 40 mg via ORAL
  Filled 2023-12-20 (×2): qty 1

## 2023-12-20 MED ORDER — PROCHLORPERAZINE EDISYLATE 10 MG/2ML IJ SOLN
5.0000 mg | INTRAMUSCULAR | Status: DC | PRN
  Administered 2023-12-20: 5 mg via INTRAVENOUS
  Filled 2023-12-20: qty 2

## 2023-12-20 MED ORDER — ESCITALOPRAM OXALATE 10 MG PO TABS
10.0000 mg | ORAL_TABLET | Freq: Every day | ORAL | Status: DC
Start: 2023-12-20 — End: 2023-12-22
  Administered 2023-12-20 – 2023-12-22 (×2): 10 mg via ORAL
  Filled 2023-12-20 (×3): qty 1

## 2023-12-20 MED ORDER — INSULIN ASPART 100 UNIT/ML IJ SOLN
0.0000 [IU] | Freq: Every day | INTRAMUSCULAR | Status: DC
  Filled 2023-12-20: qty 0.05

## 2023-12-20 MED ORDER — CEFTRIAXONE SODIUM 2 G IJ SOLR
2.0000 g | INTRAMUSCULAR | Status: DC
  Administered 2023-12-20 – 2023-12-22 (×3): 2 g via INTRAVENOUS
  Filled 2023-12-20 (×3): qty 20

## 2023-12-20 MED ORDER — INSULIN ASPART 100 UNIT/ML IJ SOLN
0.0000 [IU] | Freq: Three times a day (TID) | INTRAMUSCULAR | Status: DC
  Administered 2023-12-20 – 2023-12-21 (×4): 1 [IU] via SUBCUTANEOUS
  Administered 2023-12-22: 3 [IU] via SUBCUTANEOUS
  Filled 2023-12-20: qty 0.06

## 2023-12-20 MED ORDER — ACETAMINOPHEN 325 MG PO TABS: 650.0000 mg | ORAL_TABLET | Freq: Four times a day (QID) | ORAL | Status: DC | PRN

## 2023-12-20 MED ORDER — CHLORHEXIDINE GLUCONATE CLOTH 2 % EX PADS
6.0000 | MEDICATED_PAD | Freq: Every day | CUTANEOUS | Status: DC
  Administered 2023-12-21 – 2023-12-22 (×2): 6 via TOPICAL

## 2023-12-20 MED ORDER — HALOPERIDOL LACTATE 5 MG/ML IJ SOLN
INTRAMUSCULAR | Status: AC
  Administered 2023-12-20: 5 mg via INTRAVENOUS
  Filled 2023-12-20: qty 1

## 2023-12-20 MED ORDER — QUETIAPINE FUMARATE 25 MG PO TABS
25.0000 mg | ORAL_TABLET | Freq: Every day | ORAL | Status: DC
Start: 2023-12-20 — End: 2023-12-21
  Administered 2023-12-20: 25 mg via ORAL
  Filled 2023-12-20: qty 1

## 2023-12-20 MED ORDER — SODIUM CHLORIDE 0.9 % IV SOLN: INTRAVENOUS | Status: DC

## 2023-12-20 MED ORDER — AZITHROMYCIN 500 MG PO TABS
500.0000 mg | ORAL_TABLET | Freq: Every day | ORAL | Status: DC
  Administered 2023-12-22: 500 mg via ORAL
  Filled 2023-12-20 (×2): qty 1

## 2023-12-20 MED ORDER — HALOPERIDOL LACTATE 5 MG/ML IJ SOLN: 5.0000 mg | Freq: Once | INTRAMUSCULAR | Status: AC

## 2023-12-20 MED ORDER — CLOPIDOGREL BISULFATE 75 MG PO TABS
75.0000 mg | ORAL_TABLET | Freq: Every day | ORAL | Status: DC
  Administered 2023-12-20 – 2023-12-22 (×3): 75 mg via ORAL
  Filled 2023-12-20 (×3): qty 1

## 2023-12-20 MED ORDER — ACETAMINOPHEN 650 MG RE SUPP: 650.0000 mg | Freq: Four times a day (QID) | RECTAL | Status: DC | PRN

## 2023-12-20 MED ORDER — SENNOSIDES-DOCUSATE SODIUM 8.6-50 MG PO TABS: 1.0000 | ORAL_TABLET | Freq: Every evening | ORAL | Status: DC | PRN

## 2023-12-20 MED ORDER — HALOPERIDOL LACTATE 5 MG/ML IJ SOLN
2.0000 mg | Freq: Four times a day (QID) | INTRAMUSCULAR | Status: DC | PRN
  Administered 2023-12-20 (×2): 2 mg via INTRAVENOUS
  Filled 2023-12-20 (×2): qty 1

## 2023-12-20 MED ORDER — ATORVASTATIN CALCIUM 40 MG PO TABS
80.0000 mg | ORAL_TABLET | Freq: Every day | ORAL | Status: DC
  Administered 2023-12-20 – 2023-12-21 (×2): 80 mg via ORAL
  Filled 2023-12-20 (×2): qty 2

## 2023-12-20 MED ORDER — ENOXAPARIN SODIUM 40 MG/0.4ML IJ SOSY
40.0000 mg | PREFILLED_SYRINGE | INTRAMUSCULAR | Status: DC
  Administered 2023-12-20 – 2023-12-22 (×3): 40 mg via SUBCUTANEOUS
  Filled 2023-12-20 (×3): qty 0.4

## 2023-12-20 MED ORDER — LABETALOL HCL 5 MG/ML IV SOLN
10.0000 mg | INTRAVENOUS | Status: DC | PRN
  Administered 2023-12-20 – 2023-12-22 (×3): 10 mg via INTRAVENOUS
  Filled 2023-12-20 (×3): qty 4

## 2023-12-20 MED ORDER — SODIUM CHLORIDE 0.9 % IV SOLN
INTRAVENOUS | Status: AC
Start: 2023-12-20 — End: 2023-12-20

## 2023-12-20 MED ORDER — SODIUM CHLORIDE 0.9% FLUSH
3.0000 mL | Freq: Two times a day (BID) | INTRAVENOUS | Status: DC
  Administered 2023-12-20 – 2023-12-22 (×4): 3 mL via INTRAVENOUS

## 2023-12-20 MED ORDER — AMLODIPINE BESYLATE 5 MG PO TABS: 5.0000 mg | ORAL_TABLET | Freq: Every day | ORAL | Status: DC

## 2023-12-20 NOTE — Evaluation (Signed)
 Physical Therapy Evaluation Patient Details Name: John [name] MRN: [id number] DOB: 08-19-1945 Today's Date: 12/20/2023  History of Present Illness  John [name] is a 78 y.o. male presents to ED 12/19/23 with N/V/D , in Ed 9/6/ with AMS and vomiting, DC home. PMH: hypertension, hyperlipidemia, type 2 diabetes mellitus, chronic systolic CHF, BPH, urinary retention with Foley catheter, depression, cognitive impairment, breast cancer status postmastectomy and chemotherapy, and recent admission with Streptococcus salivarius bacteremia who presents with generalized weakness, nausea, and vomiting.  Clinical Impression   Pt admitted with above diagnosis.  Pt currently with functional limitations due to the deficits listed below (see PT Problem List). Pt will benefit from acute skilled PT to increase their independence and safety with mobility to allow discharge.       Patient found  pulling on cables from monitor box. Assisted to remove from patient reach. Patient then beigan to scoot to foot of bed and attempt standing.  Nursing summoned to room to assist with safe return to  bed, patient able to follow and scoot self back up to [facility]. Patient not deemed safe to attempt  ambulation due to cognitive status at this time.  PT will continue attempts for  mobility as patient able to safely participate. Patient will benefit from continued inpatient follow up therapy, < 3 hrs /day.    If plan is discharge home, recommend the following: Two people to help with walking and/or transfers;A lot of help with bathing/dressing/bathroom;Assistance with cooking/housework;Help with stairs or ramp for entrance;Assist for transportation   Can travel by private vehicle        Equipment Recommendations None recommended by PT  Recommendations for Other Services       Functional Status Assessment Patient has had a recent decline in their functional status and demonstrates the ability to make significant improvements in  function in a reasonable and predictable amount of time.     Precautions / Restrictions Precautions Precautions: Fall Precaution/Restrictions Comments: agitated Restrictions Weight Bearing Restrictions Per Provider Order: No      Mobility  Bed Mobility               General bed mobility comments: pt crawling down to end of bed, sitting up on end of bed trying to get up.  nursing called to room as unable to    follow directions for sdafety. patient scooted self back up to  [facility] with instruction from RN    Transfers                   General transfer comment: NT due to  patient agitation    Ambulation/Gait                  Stairs            Wheelchair Mobility     Tilt Bed    Modified Rankin (Stroke Patients Only)       Balance Overall balance assessment: Needs assistance Sitting-balance support: Bilateral upper extremity supported, Feet unsupported Sitting balance-Leahy Scale: Fair         Standing balance comment: NT , not safe                             Pertinent Vitals/Pain Pain Assessment Pain Assessment: No/denies pain    Home Living Family/patient expects to be discharged to:: Private residence Living Arrangements: Spouse/significant other   Type of Home: House Home Access: Stairs to  enter Entrance Stairs-Rails: Right;Left;Can reach both Entrance Stairs-Number of Steps: 4   Home Layout: Two level;Able to live on main level with bedroom/bathroom Home Equipment: Agricultural consultant (2 wheels);BSC/3in1;Shower seat;Lift chair Additional Comments: info from previous encounter    Prior Function Prior Level of Function : Needs assist;History of Falls (last six months)             Mobility Comments: unsure recent function, no family present       Extremity/Trunk Assessment   Upper Extremity Assessment Upper Extremity Assessment: Overall WFL for tasks assessed    Lower Extremity Assessment Lower Extremity  Assessment: Generalized weakness    Cervical / Trunk Assessment Cervical / Trunk Assessment: Normal  Communication   Communication Communication: Impaired Factors Affecting Communication: Hearing impaired    Cognition Arousal: Alert Behavior During Therapy: Agitated, Restless   PT - Cognitive impairments: No family/caregiver present to determine baseline, Difficult to assess, Orientation   Orientation impairments: Place, Time, Situation                   PT - Cognition Comments: patientrying to het Out of bed, pulling on monitor leads  at the monitor. Multimodal cues to  stop  pulling cords. Following commands: Impaired Following commands impaired: Follows one step commands inconsistently     Cueing Cueing Techniques: Verbal cues, Tactile cues     General Comments      Exercises     Assessment/Plan    PT Assessment Patient needs continued PT services  PT Problem List Decreased strength;Decreased cognition;Decreased knowledge of use of DME;Decreased activity tolerance;Decreased balance;Decreased mobility       PT Treatment Interventions DME instruction;Gait training;Cognitive remediation;Patient/family education;Functional mobility training;Therapeutic activities;Therapeutic exercise    PT Goals (Current goals can be found in the Care Plan section)  Acute Rehab PT Goals PT Goal Formulation: Patient unable to participate in goal setting Time For Goal Achievement: 01/03/24 Potential to Achieve Goals: Fair    Frequency Min 2X/week     Co-evaluation               AM-PAC PT 6 Clicks Mobility  Outcome Measure Help needed turning from your back to your side while in a flat bed without using bedrails?: A Little Help needed moving from lying on your back to sitting on the side of a flat bed without using bedrails?: A Little Help needed moving to and from a bed to a chair (including a wheelchair)?: Total Help needed standing up from a chair using your  arms (e.g., wheelchair or bedside chair)?: Total Help needed to walk in hospital room?: Total Help needed climbing 3-5 steps with a railing? : Total 6 Click Score: 10    End of Session   Activity Tolerance: Treatment limited secondary to agitation Patient left: in bed;with nursing/sitter in room Nurse Communication: Mobility status PT Visit Diagnosis: Unsteadiness on feet (R26.81);Difficulty in walking, not elsewhere classified (R26.2)    Time: 9044-8990 PT Time Calculation (min) (ACUTE ONLY): 14 min   Charges:   PT Evaluation $PT Eval Low Complexity: 1 Low   PT General Charges $$ ACUTE PT VISIT: 1 Visit         Darice Potters PT Acute Rehabilitation Services Office [phone]   Potters Darice Norris 12/20/2023, 2:04 PM

## 2023-12-20 NOTE — Progress Notes (Signed)
 PROGRESS NOTE  John [name]  FMW:[id number] DOB: 1946/02/04 DOA: 12/19/2023 PCP: [name], DO   Brief Narrative: Patient is a 78 year old male with history of hypertension, hyperlipidemia, type 2 diabetes, chronic systolic CHF, BPH, urinary retention with Foley catheter, depression, cognitive impairment, right breast cancer status post mastectomy and chemotherapy( follows with [facility]), recent admission for a Streptococcus salivarius bacteremia on linezolid  at home who presented with generalized weakness, nausea, vomiting.  He was just discharged from here on 12/15/23 when he was admitted for altered mental status. patient recently stopped taking linezolid  because of nausea, vomiting, diarrhea.  On presentation, he was afebrile, saturating well on room air, hypertensive.  WBC count was 12.9, lactate of 4.1.  CT head did not show any acute findings.  CT abdomen/pelvis did not show any acute findings in the abdomen or pelvis but showed distended gallbladder with stones,slight increased patchy airspace disease at the left bass suggesting atelectasis versus minimal pneumonia,decompressed urinary bladder but with marked diffuse bladder wall thickening.  Hospital course remarkable for agitation requiring restraints.  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  Continue IV fluid, antiemetics, monitor electrolytes  Left lower lobe pneumonia/lactic acidosis: No fever, has mild leukocytosis.  As  per the wife, he was coughing at home.  CT abdomen/pelvis showed possible left lower lobe opacity versus atelectasis.  Currently on ceftriaxone  and azithromycin   Recent  of history of streptococcus bacteremia: History of Staphylococcus salivarius bacteremia .  TEE was negative at that time.    He was discharged on 8/30 with plan to continue linezolid  for 9 more days(14 days total course).  Per wife, he just missed day of antibiotic since then.  I guess he has completed the antibiotics course for bacteremia.  Now on ceftriaxone  for pneumonia  History of HFrEF/CAD: Appeared dehydrated on presentation.  Started on IV fluid.  No anginal symptoms.  On Lipitor , Plavix  at home.  Has pacemaker.  Recent EF 25 to 35%. Completed 6 months of Eliquis  for provoked PE.  History of chronic hypertension: Previously on midodrine .  Not taking currently.  Currently hypertensive.  Continue labetalol  as needed.  Diabetes type 2: Currently on sliding scale.  Monitor blood sugars.  Recent A1c of 7.5  Chronic urinary retention: Has  Foley catheter.  Continue Foley care  History of depression: On Lexapro   History of right breast cancer: Follows with [facility].  Status post mastectomy, chemotherapy.  Stopped tamoxifen  due to history of PE  Dementia/cognitive impairment: Concern for hospital-acquired delirium on last hospitalization. He was discharged on 12/15/2023 from here when he was admitted for altered mental status.  He was also hospitalized from 12/04/2023 to 12/10/2023 for acute metabolic encephalopathy, sepsis secondary to E faecalis UTI, Streptococcus bacteremia.  Wife  does not believe he ever returned to his baseline mental status following last hospitalization .  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  Last CT head did not show any acute findings.  Previous MRI was was concerning for Alzheimer's dementia.  Continue delirium precaution, frequent reorientation.  We recommend outpatient potation  for underlying  dementia/cognitive workup. Continue haldol  for severe agitation. Ordered seroquel  for HS Lives with wife.  Will consult PT/OT.  On last discharge, he was sent home with home health          DVT prophylaxis:enoxaparin  (LOVENOX ) injection 40 mg Start: 12/20/23 1000     Code Status: Limited: Do not attempt resuscitation (DNR) -DNR-LIMITED -Do Not Intubate/DNI   Family Communication: Called and discussed with wife [name] on 9/9  Patient status:Inpatient  Patient is from :Home  Anticipated discharge un:Ynfz  Estimated DC date:2-3 days   Consultants: None  Procedures:None  Antimicrobials:  Anti-infectives (From admission, onward)    Start     Dose/Rate Route Frequency Ordered Stop   12/20/23 0700  cefTRIAXone  (ROCEPHIN ) 2 g in sodium chloride  0.9 % 100 mL IVPB        2 g 200 mL/hr over 30 Minutes Intravenous Every 24 hours 12/20/23 0132     12/19/23 2300  ceFEPIme  (MAXIPIME ) 2 g in sodium chloride  0.9 % 100 mL IVPB        2 g 200 mL/hr over 30 Minutes Intravenous  Once 12/19/23 2253 12/19/23 2344   12/19/23 2300  metroNIDAZOLE  (FLAGYL ) IVPB 500 mg        500 mg 100 mL/hr over 60 Minutes Intravenous  Once 12/19/23 2253 12/20/23 0039   12/19/23 2300  vancomycin  (VANCOCIN ) IVPB 1000 mg/200 mL premix  Status:  Discontinued        1,000 mg 200 mL/hr over 60 Minutes Intravenous  Once 12/19/23 2253 12/19/23 2253   12/19/23 2300  vancomycin  (VANCOREADY) IVPB 1750 mg/350 mL        1,750 mg 175 mL/hr over 120 Minutes Intravenous  Once 12/19/23 2253 12/20/23 0248       Subjective: Patient seen and examined at bedside today.  He was hypertensive during my evaluation.  Confused.  Was on restraints.  Did not appear to be in any acute distress.  Denies abdomen pain, nausea or vomiting or chest pain or shortness of breath.  No peripheral edema noted.  Foley catheter in place.  Objective: Vitals:   12/20/23 0613 12/20/23 0615 12/20/23 0630 12/20/23 0716  BP:      Pulse: 80 81 80    Resp: 18 (!) 21 18   Temp:    97.9 F (36.6 C)  TempSrc:    Oral  SpO2: 96% 96% 94%     Intake/Output Summary (Last 24 hours) at 12/20/2023 0831 Last data filed at 12/20/2023 0430 Gross per 24 hour  Intake 201.24 ml  Output 1000 ml  Net -798.76 ml   There were no vitals filed for this visit.  Examination:  General exam: Deconditioned, confused, lying on bed HEENT: PERRL Respiratory system:  no wheezes or crackles  Cardiovascular system: S1 & S2 heard, RRR.  Gastrointestinal system: Abdomen is nondistended, soft and nontender. Central nervous system: Alert, awake, tells correct year, easily distracted Extremities: No edema, no clubbing ,no cyanosis Skin: No rashes, no ulcers,no icterus   GU: Foley   Data Reviewed: I have personally reviewed following labs and imaging studies  CBC: Recent Labs  Lab 12/13/23 1222 12/13/23 1229 12/13/23 2120 12/14/23 0517 12/19/23 1959 12/20/23 0520  WBC 9.1  --  10.5 10.0 12.9* 12.3*  NEUTROABS 6.4  --   --   --   --   --   HGB 12.9* 13.9 12.4* 12.1* 12.1* 11.1*  HCT 40.2 41.0 37.8* 38.2* 37.9* 34.3*  MCV 87.6  --  86.1 85.8 87.7 88.9  PLT 279  --  273 250 235 197   Basic Metabolic Panel: Recent Labs  Lab 12/13/23 1222 12/13/23 1229 12/13/23 2120 12/14/23 0517 12/19/23 1625 12/20/23 0520  NA 139 138  --  137 134* 135  K 4.0 3.9  --  3.8 4.2 3.9  CL 101 100  --  100 98 100  CO2 25  --   --  23 18* 21*  GLUCOSE 118* 117*  --  123* 167* 159*  BUN 14 17  --  10 15 13   CREATININE 0.85 0.90 0.90 0.64 0.99 0.78  CALCIUM  9.9  --   --  9.6 10.2 9.3  MG  --   --   --  1.5*  --   --      Recent Results (from the past 240 hours)  Urine Culture     Status: Abnormal   Collection Time: 12/13/23  4:07 PM   Specimen: Urine, Random  Result Value Ref Range Status   Specimen Description   Final    URINE, RANDOM Performed at [facility], [address], [place], [place] [address]    Special Requests   Final     NONE Reflexed from [id number] Performed at [facility], [address] [place], [place] [address]    Culture 20,000 COLONIES/mL YEAST (A)  Final   Report Status 12/15/2023 FINAL  Final     Radiology Studies: CT ABDOMEN PELVIS W CONTRAST Result Date: 12/19/2023 CLINICAL DATA:  Abdomen pain nausea vomiting EXAM: CT ABDOMEN AND PELVIS WITH CONTRAST TECHNIQUE: Multidetector CT imaging of the abdomen and pelvis was performed using the standard protocol following bolus administration of intravenous contrast. RADIATION DOSE REDUCTION: This exam was performed according to the departmental dose-optimization program which includes automated exposure control, adjustment of the mA and/or kV according to patient size and/or use of iterative reconstruction technique. CONTRAST:  OMNIPAQUE  IOHEXOL  300 MG/ML  SOLN COMPARISON:  CT 12/13/2023, 12/04/2023 FINDINGS: Lower chest: Lung bases demonstrate slight increased patchy airspace disease at the left base. Partially visualized cardiac pacing leads. Coronary vascular calcification. Hepatobiliary: Distended gallbladder with stones. No biliary dilatation or focal hepatic abnormality. Pancreas: Atrophic.  No inflammation or ductal dilatation Spleen: Granuloma Adrenals/Urinary Tract: Adrenal glands are normal. Kidneys show no hydronephrosis. Decompressed diffusely thick walled bladder with catheter. Stomach/Bowel: Stomach within normal limits. No dilated small bowel. No acute bowel wall thickening. Vascular/Lymphatic: Aortic atherosclerosis. No enlarged abdominal or pelvic lymph nodes. Reproductive: Enlarged prostate Other: Negative for pelvic effusion or free air. Musculoskeletal: No acute or suspicious osseous abnormality IMPRESSION: 1. No CT evidence for acute intra-abdominal or pelvic abnormality. 2. Distended gallbladder with stones. 3. Slight increased patchy airspace disease at the left base, atelectasis versus minimal pneumonia. 4. Enlarged prostate.  Decompressed urinary bladder but with marked diffuse bladder wall thickening 5. Aortic atherosclerosis. Aortic Atherosclerosis (ICD10-I70.0). Electronically Signed   By: Luke Bun M.D.   On: 12/19/2023 22:30   CT Head Wo Contrast Result Date: 12/19/2023 CLINICAL DATA:  Intermittent altered mental status for 2 weeks. EXAM: CT HEAD WITHOUT CONTRAST TECHNIQUE: Contiguous axial images were obtained from the base of the skull through the vertex without intravenous contrast. RADIATION DOSE REDUCTION: This exam was performed according to the departmental dose-optimization program which includes automated exposure control, adjustment of the mA and/or kV according to patient size and/or use of iterative reconstruction technique. COMPARISON:  December 13, 2023 FINDINGS: Brain: There is generalize cerebral atrophy with  widening of the extra-axial spaces and ventricular dilatation. There are areas of decreased attenuation within the white matter tracts of the supratentorial brain, consistent with microvascular disease changes. Small, chronic bilateral basal ganglia lacunar infarcts are noted. Vascular: Marked severity bilateral cavernous carotid artery calcification is seen. Skull: Normal. Negative for fracture or focal lesion. Sinuses/Orbits: No acute finding. Other: None. IMPRESSION: 1. Generalized cerebral atrophy with chronic white matter small vessel ischemic changes. 2. Small, chronic bilateral basal ganglia lacunar infarcts. 3. No acute intracranial abnormality. Electronically Signed   By: Suzen Dials M.D.   On: 12/19/2023 17:50    Scheduled Meds:  atorvastatin   80 mg Oral QHS   Chlorhexidine  Gluconate Cloth  6 each Topical Daily   clopidogrel   75 mg Oral Q breakfast   enoxaparin  (LOVENOX ) injection  40 mg Subcutaneous Q24H   escitalopram   10 mg Oral Daily   insulin  aspart  0-5 Units Subcutaneous QHS   insulin  aspart  0-6 Units Subcutaneous TID WC   pantoprazole   40 mg Oral QHS   sodium chloride   flush  3 mL Intravenous Q12H   Continuous Infusions:  sodium chloride  100 mL/hr at 12/20/23 0159   cefTRIAXone  (ROCEPHIN )  IV 2 g (12/20/23 0613)     LOS: 0 days   Ivonne Mustache, MD [facility] P9/12/2023, 8:31 AM

## 2023-12-20 NOTE — ED Notes (Signed)
 Pt remains restless. Attempted to removed right wrist restraint to which pt began to attempt removal of monitoring system and sts he going home. Pt reoriented to situation and place. Informed he is currently in the hospital receiving to treatment. Attempt to orient pt was unsuccessful. Pt responded I don't care. Non-violent restraints continued.

## 2023-12-20 NOTE — H&P (Signed)
 History and Physical    John [name] FMW:[id number] DOB: Feb 22, 1946 DOA: 12/19/2023  PCP: [name], DO   Patient coming from: Home   Chief Complaint: General weakness, N/V   HPI: John [name] is a 78 y.o. male with medical history significant for hypertension, hyperlipidemia, type 2 diabetes mellitus, chronic systolic CHF, BPH, urinary retention with Foley catheter, depression, cognitive impairment, breast cancer status postmastectomy and chemotherapy, and recent admission with Streptococcus salivarius bacteremia who presents with generalized weakness, nausea, and vomiting.    Patient had been on linezolid  at home until it was discontinued by family due to nausea, vomiting, and diarrhea.  The diarrhea has since stopped but the patient continues to vomit whenever he tries to eat.  He has developed progressive generalized weakness.  He denies cough, shortness of breath, abdominal pain, or fevers associated with this.  Patient does not have any acute complaints and would like to go home as soon as possible but family is concerned that he is unable to eat and is becoming progressively weak.  ED Course: Upon arrival to the ED, patient is found to be afebrile and saturating well on room air with normal HR and elevated BP.  Labs are most notable for normal creatinine, WBC 12,900, and lactic acid 4.1.  There are no acute findings on head CT, no acute intra-abdominal or pelvic abnormality on CT, but minimal pneumonia versus atelectasis is noted at the lung base.  Blood and urine cultures were collected in the ED and the patient was given 2 L of LR, Zofran , vancomycin , cefepime , and Flagyl .  Review of Systems:  All other systems reviewed and apart from HPI, are negative.  Past Medical History:  Diagnosis Date   BPH (benign prostatic hyperplasia)    CTS (carpal tunnel syndrome)    Depression    Diabetes mellitus    Essential hypertension    GERD (gastroesophageal reflux  disease)    History of breast cancer    Hypercholesteremia    Neuropathy    Stroke [facility])     Past Surgical History:  Procedure Laterality Date   CATARACT EXTRACTION, BILATERAL  01/18/2017   CORONARY STENT INTERVENTION N/A 08/10/2023   Procedure: CORONARY STENT INTERVENTION;  Surgeon: Darron Deatrice LABOR, MD;  Location: [facility] INVASIVE CV LAB;  Service: Cardiovascular;  Laterality: N/A;   LEFT HEART CATH AND CORONARY ANGIOGRAPHY N/A 08/10/2023   Procedure: LEFT HEART CATH AND CORONARY ANGIOGRAPHY;  Surgeon: Darron Deatrice LABOR, MD;  Location: [facility] INVASIVE CV LAB;  Service: Cardiovascular;  Laterality: N/A;   LITHOTRIPSY     MASTECTOMY Right 05/02/2018   PACEMAKER IMPLANT N/A 05/13/2023   Procedure: PACEMAKER IMPLANT;  Surgeon: Kennyth Chew, MD;  Location: [facility] INVASIVE CV LAB;  Service: Cardiovascular;  Laterality: N/A;   TEMPORARY PACEMAKER N/A 05/11/2023   Procedure: TEMPORARY PACEMAKER;  Surgeon: [place], Peter M, MD;  Location: [facility] INVASIVE CV LAB;  Service: Cardiovascular;  Laterality: N/A;   TONSILLECTOMY AND ADENOIDECTOMY     TRANSESOPHAGEAL ECHOCARDIOGRAM (CATH LAB) N/A 12/08/2023   Procedure: TRANSESOPHAGEAL ECHOCARDIOGRAM;  Surgeon: Raford Riggs, MD;  Location: [facility] INVASIVE CV LAB;  Service: Cardiovascular;  Laterality: N/A;    Social History:   reports that he has never smoked. He has never used smokeless tobacco. He reports that he does not drink alcohol and does not use drugs.  Allergies  Allergen Reactions   Ramipril Anaphylaxis   Adhesive [Tape] Rash   Other Diarrhea    Severe intolerance to Chemotherapy in the past.  Sertraline Other (See Comments)    Extreme headaches    Family History  Problem Relation Age of Onset   Lymphoma Mother    Cancer Mother    Diabetes Father    Stroke Father    Ovarian cancer Sister    Cancer Sister    Cancer Paternal Grandmother      Prior to Admission medications   Medication Sig Start Date End Date Taking? Authorizing Provider   acetaminophen  (TYLENOL ) 650 MG CR tablet Take 1 tablet (650 mg total) by mouth every 8 (eight) hours as needed for pain. 05/14/23   Zhao, Xika, NP  Ascorbic Acid  (VITAMIN C) 1000 MG tablet Take 1,000 mg by mouth 3 (three) times a week.    [provider]  atorvastatin  (LIPITOR ) 80 MG tablet Take 1 tablet (80 mg total) by mouth at bedtime. 09/14/23   [name], DO  clopidogrel  (PLAVIX ) 75 MG tablet Take 1 tablet (75 mg total) by mouth daily with breakfast. 10/11/23   [name], DO  clotrimazole -betamethasone  (LOTRISONE ) cream Apply 1 Application topically daily. Patient taking differently: Apply 1 Application topically daily as needed (rash). 07/11/23   [name], DO  cyanocobalamin  1000 MCG tablet Take 1 tablet (1,000 mcg total) by mouth daily. 09/14/23   [name], DO  escitalopram  (LEXAPRO ) 10 MG tablet Take 1 tablet (10 mg total) by mouth daily. 10/03/23   [name], DO  ferrous sulfate  (FEROSUL) 325 (65 FE) MG tablet Take 1 tablet (325 mg total) by mouth daily with breakfast. 11/28/23   Frann, Mabel Mt, DO  finasteride  (PROSCAR ) 5 MG tablet Take 1 tablet (5 mg total) by mouth daily. *Need appointment for future refills.* 11/28/23   Wendling, Mabel Mt, DO  hyoscyamine  (LEVSIN  SL) 0.125 MG SL tablet Place 1 tablet (0.125 mg total) under the tongue every 4 (four) hours as needed for bladder spasms. 08/17/23   Angiulli, Toribio PARAS, PA-C  insulin  glargine (LANTUS ) 100 UNIT/ML injection Inject 0.15 mLs (15 Units total) into the skin daily for 7 days. 11/11/23 12/05/23  [name], DO  Magnesium  Citrate 200 MG TABS Take 200 mg by mouth 2 (two) times daily before a meal. 12/15/23   Lue [name], MD  metFORMIN  (GLUCOPHAGE ) 500 MG tablet Take 1 tablet (500 mg total) by mouth 2 (two) times daily with a meal. 09/14/23   Wendling, Mabel Mt, DO  Multiple Vitamin (MULTIVITAMIN) tablet Take 1 tablet by mouth 2 (two)  times a week.    [provider]  pantoprazole  (PROTONIX ) 40 MG tablet Take 1 tablet (40 mg total) by mouth at bedtime. 12/19/23   [name], DO  senna-docusate (SENOKOT-S) 8.6-50 MG tablet Take 1 tablet by mouth 2 (two) times daily. 12/15/23   Lue [name], MD  amLODipine  (NORVASC ) 2.5 MG tablet Take 1 tablet (2.5 mg total) by mouth daily. 06/15/23 06/30/23  [name], DO  apixaban  (ELIQUIS ) 5 MG TABS tablet Take 1 tablet (5 mg total) by mouth 2 (two) times daily. 10/11/23 10/11/23  [name], DO    Physical Exam: Vitals:   12/19/23 2004 12/19/23 2100 12/19/23 2115 12/20/23 0046  BP: (!) 189/87 (!) 164/79 (!) 136/104   Pulse: 91 82    Resp: 16 18    Temp: 98.2 F (36.8 C)   98.3 F (36.8 C)  TempSrc: Oral   Oral  SpO2: 97% 97%      Constitutional: NAD, no pallor or diaphoresis  Eyes: PERTLA, lids and conjunctivae normal ENMT: Mucous membranes are moist. Posterior pharynx clear of any exudate or lesions.   Neck: supple, no masses  Respiratory: no wheezing, no crackles. No accessory muscle use.  Cardiovascular: S1 & S2 heard, regular rate and rhythm. No extremity edema.  Abdomen: No tenderness, soft. Bowel sounds active.  Musculoskeletal: no clubbing / cyanosis. No joint deformity upper and lower extremities.   Skin: no significant rashes, lesions, ulcers. Warm, dry, well-perfused. Neurologic: CN 2-12 grossly intact. Moving all extremities. Alert and oriented.  Psychiatric: Calm. Cooperative.    Labs and Imaging on Admission: I have personally reviewed following labs and imaging studies  CBC: Recent Labs  Lab 12/13/23 1222 12/13/23 1229 12/13/23 2120 12/14/23 0517 12/19/23 1959  WBC 9.1  --  10.5 10.0 12.9*  NEUTROABS 6.4  --   --   --   --   HGB 12.9* 13.9 12.4* 12.1* 12.1*  HCT 40.2 41.0 37.8* 38.2* 37.9*  MCV 87.6  --  86.1 85.8 87.7  PLT 279  --  273 250 235   Basic Metabolic Panel: Recent Labs  Lab 12/13/23 1222  12/13/23 1229 12/13/23 2120 12/14/23 0517 12/19/23 1625  NA 139 138  --  137 134*  K 4.0 3.9  --  3.8 4.2  CL 101 100  --  100 98  CO2 25  --   --  23 18*  GLUCOSE 118* 117*  --  123* 167*  BUN 14 17  --  10 15  CREATININE 0.85 0.90 0.90 0.64 0.99  CALCIUM  9.9  --   --  9.6 10.2  MG  --   --   --  1.5*  --    GFR: Estimated Creatinine Clearance: 68.6 mL/min (by C-G formula based on SCr of 0.99 mg/dL). Liver Function Tests: Recent Labs  Lab 12/13/23 1222 12/19/23 1959  AST 21 30  ALT 21 33  ALKPHOS 70 70  BILITOT 2.4* 2.2*  PROT 6.1* 6.1*  ALBUMIN 3.9 3.9   Recent Labs  Lab 12/13/23 1222  LIPASE 18   Recent Labs  Lab 12/13/23 1618 12/19/23 2138  AMMONIA <13 18   Coagulation Profile: No results for input(s): INR, PROTIME in the last 168 hours. Cardiac Enzymes: No results for input(s): CKTOTAL, CKMB, CKMBINDEX, TROPONINI in the last 168 hours. BNP (last 3 results) No results for input(s): PROBNP in the last 8760 hours. HbA1C: No results for input(s): HGBA1C in the last 72 hours. CBG: Recent Labs  Lab 12/14/23 1144 12/14/23 1643 12/14/23 2028 12/15/23 0711 12/15/23 1207  GLUCAP 162* 147* 200* 148* 193*   Lipid Profile: No results for input(s): CHOL, HDL, LDLCALC, TRIG, CHOLHDL, LDLDIRECT in the last 72 hours. Thyroid  Function Tests: No results for input(s): TSH, T4TOTAL, FREET4, T3FREE, THYROIDAB in the last 72 hours. Anemia Panel: No results for input(s): VITAMINB12, FOLATE, FERRITIN, TIBC, IRON, RETICCTPCT in the last 72 hours. Urine analysis:    Component Value Date/Time   COLORURINE YELLOW 12/19/2023 2102   APPEARANCEUR TURBID (A) 12/19/2023 2102   LABSPEC 1.020 12/19/2023 2102   PHURINE 5.0 12/19/2023 2102   GLUCOSEU NEGATIVE 12/19/2023 2102   HGBUR NEGATIVE 12/19/2023 2102   BILIRUBINUR NEGATIVE 12/19/2023 2102   KETONESUR 5 (A) 12/19/2023 2102   PROTEINUR 30 (A) 12/19/2023 2102   NITRITE  NEGATIVE 12/19/2023 2102   LEUKOCYTESUR LARGE (A) 12/19/2023 2102   Sepsis Labs: @LABRCNTIP (procalcitonin:4,lacticidven:4) ) Recent Results (from the past 240 hours)  Urine Culture     Status:  Abnormal   Collection Time: 12/13/23  4:07 PM   Specimen: Urine, Random  Result Value Ref Range Status   Specimen Description   Final    URINE, RANDOM Performed at [facility], [address] [place], [place] [address]    Special Requests   Final    NONE Reflexed from [id number] Performed at [facility], [address], [place], [place] [address]    Culture 20,000 COLONIES/mL YEAST (A)  Final   Report Status 12/15/2023 FINAL  Final     Radiological Exams on Admission: CT ABDOMEN PELVIS W CONTRAST Result Date: 12/19/2023 CLINICAL DATA:  Abdomen pain nausea vomiting EXAM: CT ABDOMEN AND PELVIS WITH CONTRAST TECHNIQUE: Multidetector CT imaging of the abdomen and pelvis was performed using the standard protocol following bolus administration of intravenous contrast. RADIATION DOSE REDUCTION: This exam was performed according to the departmental dose-optimization program which includes automated exposure control, adjustment of the mA and/or kV according to patient size and/or use of iterative reconstruction technique. CONTRAST:  OMNIPAQUE  IOHEXOL  300 MG/ML  SOLN COMPARISON:  CT 12/13/2023, 12/04/2023 FINDINGS: Lower chest: Lung bases demonstrate slight increased patchy airspace disease at the left base. Partially visualized cardiac pacing leads. Coronary vascular calcification. Hepatobiliary: Distended gallbladder with stones. No biliary dilatation or focal hepatic abnormality. Pancreas: Atrophic.  No inflammation or ductal dilatation Spleen: Granuloma Adrenals/Urinary Tract: Adrenal glands are normal. Kidneys show no hydronephrosis. Decompressed diffusely thick walled bladder with catheter. Stomach/Bowel: Stomach within normal limits. No dilated small bowel. No  acute bowel wall thickening. Vascular/Lymphatic: Aortic atherosclerosis. No enlarged abdominal or pelvic lymph nodes. Reproductive: Enlarged prostate Other: Negative for pelvic effusion or free air. Musculoskeletal: No acute or suspicious osseous abnormality IMPRESSION: 1. No CT evidence for acute intra-abdominal or pelvic abnormality. 2. Distended gallbladder with stones. 3. Slight increased patchy airspace disease at the left base, atelectasis versus minimal pneumonia. 4. Enlarged prostate. Decompressed urinary bladder but with marked diffuse bladder wall thickening 5. Aortic atherosclerosis. Aortic Atherosclerosis (ICD10-I70.0). Electronically Signed   By: Luke Bun M.D.   On: 12/19/2023 22:30   CT Head Wo Contrast Result Date: 12/19/2023 CLINICAL DATA:  Intermittent altered mental status for 2 weeks. EXAM: CT HEAD WITHOUT CONTRAST TECHNIQUE: Contiguous axial images were obtained from the base of the skull through the vertex without intravenous contrast. RADIATION DOSE REDUCTION: This exam was performed according to the departmental dose-optimization program which includes automated exposure control, adjustment of the mA and/or kV according to patient size and/or use of iterative reconstruction technique. COMPARISON:  December 13, 2023 FINDINGS: Brain: There is generalize cerebral atrophy with widening of the extra-axial spaces and ventricular dilatation. There are areas of decreased attenuation within the white matter tracts of the supratentorial brain, consistent with microvascular disease changes. Small, chronic bilateral basal ganglia lacunar infarcts are noted. Vascular: Marked severity bilateral cavernous carotid artery calcification is seen. Skull: Normal. Negative for fracture or focal lesion. Sinuses/Orbits: No acute finding. Other: None. IMPRESSION: 1. Generalized cerebral atrophy with chronic white matter small vessel ischemic changes. 2. Small, chronic bilateral basal ganglia lacunar infarcts.  3. No acute intracranial abnormality. Electronically Signed   By: Suzen Dials M.D.   On: 12/19/2023 17:50    Assessment/Plan   1. Nausea and vomiting  - Exam is benign and there are no acute findings on CT  - Continue IVF hydration, monitor electrolytes, trial clears and advance as tolerated   2. Lactic acidosis  - Likely related to recent N/V/D and hypovolemia  -  Blood and urine were cultured from ED and he was given 2 liters LR  - Continue IVF hydration, trend lactate  3. Streptococcus bacteremia  - Was seen by ID, treated in the hospital with Rocephin , and discharged with linezolid  which was stopped by family d/t development of N/V/D  - TEE was negative - Treat with Rocephin  for now   4. Chronic systolic CHF  - Appears hypovolemic; therapies have been limited by hypotension  - Hold Lasix , cautiously continue IVF hydration, monitor volume status    5. Type 2 DM  - A1c was 7.5% in April 2025  - Check CBGs and use low-intensity SSI for now    6. CAD  - No anginal symptoms  - Continue Lipitor  and Plavix     7. Depression  - Continue Lexapro     8. Breast cancer  - Follows with [facility] and is s/p mastectomy and chemotherapy, stopped Tamoxifen  early due to PE   9. Hx of VTE   - Was considered provoked and he completed treatment   10. Cognitive impairment - Delirium precautions    DVT prophylaxis: Lovenox   Code Status: DNR  Level of Care: Level of care: Progressive Family Communication: Family updated from ED  Disposition Plan:  Patient is from: Home  Anticipated d/c is to: TBD Anticipated d/c date is: 9/9 or 12/21/23  Patient currently: Pending tolerance of adequate oral intake, PT eval Consults called: None  Admission status: Observation     Evalene GORMAN Sprinkles, MD [facility]  12/20/2023, 1:33 AM
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
## 2023-12-21 ENCOUNTER — Inpatient Hospital Stay: Admitting: Family Medicine

## 2023-12-21 DIAGNOSIS — E872 Acidosis, unspecified: Secondary | ICD-10-CM | POA: Diagnosis not present

## 2023-12-21 LAB — CBC
HCT: 36.5 % — ABNORMAL LOW (ref 39.0–52.0)
Hemoglobin: 11.7 g/dL — ABNORMAL LOW (ref 13.0–17.0)
MCH: 28.5 pg (ref 26.0–34.0)
MCHC: 32.1 g/dL (ref 30.0–36.0)
MCV: 88.8 fL (ref 80.0–100.0)
Platelets: 171 K/uL (ref 150–400)
RBC: 4.11 MIL/uL — ABNORMAL LOW (ref 4.22–5.81)
RDW: 19.5 % — ABNORMAL HIGH (ref 11.5–15.5)
WBC: 7.7 K/uL (ref 4.0–10.5)
nRBC: 0 % (ref 0.0–0.2)

## 2023-12-21 LAB — BASIC METABOLIC PANEL WITH GFR
Anion gap: 13 (ref 5–15)
BUN: 7 mg/dL — ABNORMAL LOW (ref 8–23)
CO2: 24 mmol/L (ref 22–32)
Calcium: 9.6 mg/dL (ref 8.9–10.3)
Chloride: 103 mmol/L (ref 98–111)
Creatinine, Ser: 0.64 mg/dL (ref 0.61–1.24)
GFR, Estimated: >60 mL/min (ref >60)
Glucose, Bld: 147 mg/dL — ABNORMAL HIGH (ref 70–99)
Potassium: 3.5 mmol/L (ref 3.5–5.1)
Sodium: 140 mmol/L (ref 135–145)

## 2023-12-21 LAB — GLUCOSE, CAPILLARY
Glucose-Capillary: 137 mg/dL — ABNORMAL HIGH (ref 70–99)
Glucose-Capillary: 181 mg/dL — ABNORMAL HIGH (ref 70–99)
Glucose-Capillary: 138 mg/dL — ABNORMAL HIGH (ref 70–99)
Glucose-Capillary: 130 mg/dL — ABNORMAL HIGH (ref 70–99)

## 2023-12-21 MED ORDER — LOSARTAN POTASSIUM 25 MG PO TABS
25.0000 mg | ORAL_TABLET | Freq: Every day | ORAL | Status: DC
  Administered 2023-12-22 (×2): 25 mg via ORAL
  Filled 2023-12-21 (×3): qty 1

## 2023-12-21 MED ORDER — QUETIAPINE FUMARATE 25 MG PO TABS: 12.5000 mg | ORAL_TABLET | Freq: Every day | ORAL | Status: DC

## 2023-12-21 MED ORDER — SODIUM CHLORIDE 0.9 % IV SOLN: INTRAVENOUS | Status: DC

## 2023-12-21 MED ORDER — BOOST / RESOURCE BREEZE PO LIQD CUSTOM
1.0000 | Freq: Three times a day (TID) | ORAL | Status: DC
  Administered 2023-12-22: 1 via ORAL

## 2023-12-21 NOTE — Plan of Care (Signed)
 Patient alert to self, agitated and combative at the beginning of the shift. Non-violent wrist restraints applied per MD order and noted bruising and redness present on both arms related to patient pulling against restraints.  PRN medication administered for agitation with minimal effect. Patient remained in restraints throughout the night due to intermittent confusion and restlessness. Patient observed to sleep intermittently during the shift. Safety maintained, bed locked and in low position,side rails upright. Call light given and within reach.  Comfort measures provided.  Problem: Skin Integrity: Goal: Risk for impaired skin integrity will decrease Outcome: Progressing   Problem: Safety: Goal: Non-violent Restraint(s) Outcome: Progressing  Problem: Safety: Goal: Ability to remain free from injury will improve Outcome: Progressing

## 2023-12-21 NOTE — Plan of Care (Signed)
  Problem: Nutritional: Goal: Maintenance of adequate nutrition will improve Outcome: Not Progressing Goal: Progress toward achieving an optimal weight will improve Outcome: Not Progressing   Problem: Metabolic: Goal: Ability to maintain appropriate glucose levels will improve Outcome: Not Progressing

## 2023-12-21 NOTE — Progress Notes (Signed)
 Pt found resting comfortably in be.When woken up, pt alert to self, place, and time. At this time pt was not attempting to interfere with care and soft wrist restraints were removed and order discontinued. Will continue to monitor.

## 2023-12-21 NOTE — Progress Notes (Signed)
 Pt too lethargic the AM to take morning medication. Opened eyes to voice and was able to state correct birthday but unable to keep eyes open enough to swallow. MD notified and aware. VSS. Pt now refusing to take 1400 medication and told RN he will not take it because you are a woman. Pt unaware that he is at the hospital at this time but able to give correct birthday. Attempted to reorient pt but he asked to be left alone to sleep. Bed alarm on, call light in reach and door left open for safety. Will attempt to pass pills again later.

## 2023-12-21 NOTE — Progress Notes (Signed)
 PROGRESS NOTE  Jefferson Fullam  FMW:[id number] DOB: 1945/12/17 DOA: 12/19/2023 PCP: [name], DO   Brief Narrative: Patient is a 78 year old male with history of hypertension, hyperlipidemia, type 2 diabetes, chronic systolic CHF, BPH, urinary retention with Foley catheter, depression, cognitive impairment, right breast cancer status post mastectomy and chemotherapy( follows with [facility]), recent admission for a Streptococcus salivarius bacteremia on linezolid  at home who presented with generalized weakness, nausea, vomiting.  He was just discharged from here on 12/15/23 when he was admitted for altered mental status. patient recently stopped taking linezolid  because of nausea, vomiting, diarrhea.  On presentation, he was afebrile, saturating well on room air, hypertensive.  WBC count was 12.9, lactate of 4.1.  CT head did not show any acute findings.  CT abdomen/pelvis did not show any acute findings in the abdomen or pelvis but showed distended gallbladder with stones,slight increased patchy airspace disease at the left bass suggesting atelectasis versus minimal pneumonia,decompressed urinary bladder but with marked diffuse bladder wall thickening.  Hospital course remarkable for agitation requiring restraints. Encephalopathy now improving.  Off restraints now.  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  These symptoms have resolved  Left lower lobe  pneumonia/lactic acidosis: No fever, has mild leukocytosis.  As per the wife, he was coughing at home.  CT abdomen/pelvis showed possible left lower lobe opacity versus atelectasis.  Currently on ceftriaxone  and azithromycin .  We can change to Augmentin  on discharge.  Recent  of history of streptococcus bacteremia: History of Staphylococcus salivarius bacteremia .  TEE was negative at that time.    He was discharged on 8/30 with plan to continue linezolid  for 9 more days(14 days total course).  Per wife, he just missed day of antibiotic since then.  I guess he has completed the antibiotics course for bacteremia.  Now on ceftriaxone  for pneumonia  History of HFrEF/CAD: Appeared dehydrated on presentation.  Started on IV fluid.  No anginal symptoms.  On Lipitor , Plavix  at home.  Has pacemaker.  Recent EF 25 to 35%.  Now appears euvolemic.  IV fluid discontinued Completed 6 months of Eliquis  for provoked PE.  History of chronic hypertension: Previously on midodrine .  Not taking currently.  Currently hypertensive.  Will start on losartan . continue labetalol  as needed.  Diabetes type 2: Currently on sliding scale.  Monitor blood sugars.  Recent A1c of 7.5  Chronic urinary retention: Has  Foley catheter.  Continue Foley care  History of depression: On Lexapro   History of right breast cancer: Follows with [facility].  Status post mastectomy, chemotherapy.  Stopped tamoxifen  due to history of PE  Dementia/cognitive impairment: Concern for hospital-acquired delirium on last hospitalization. He was discharged on 12/15/2023 from here when he was admitted for altered mental status.  He was also hospitalized from 12/04/2023 to 12/10/2023 for acute metabolic encephalopathy, sepsis secondary to E faecalis UTI, Streptococcus bacteremia.  Wife  does not believe he ever returned to his baseline mental status following last hospitalization .  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  Last CT head did not show any acute  findings.  Previous MRI was was concerning for Alzheimer's dementia.  Continue delirium precaution, frequent reorientation.  We recommend outpatient potation for underlying dementia/cognitive workup. Continue haldol  for severe agitation. Ordered seroquel  for HS Lives with wife.   On last discharge, he was sent home with home health.  PT recommending SNF on discharge.  TOC consulted          DVT prophylaxis:enoxaparin  (LOVENOX ) injection 40 mg Start: 12/20/23 1000     Code Status: Limited: Do not attempt resuscitation (DNR) -DNR-LIMITED -Do Not Intubate/DNI   Family Communication: Called and discussed with wife [name] on 9/9  Patient status:Inpatient  Patient is from :Home  Anticipated discharge un:Ynfz  Estimated DC date:2-3 days   Consultants: None  Procedures:None  Antimicrobials:  Anti-infectives (From admission, onward)    Start     Dose/Rate Route Frequency Ordered Stop   12/20/23 1400  azithromycin  (ZITHROMAX ) tablet 500 mg        500 mg Oral Daily 12/20/23 1355 12/25/23 0959   12/20/23 0700  cefTRIAXone  (ROCEPHIN ) 2 g in sodium chloride  0.9 % 100 mL IVPB        2 g 200 mL/hr over 30 Minutes Intravenous Every 24 hours 12/20/23 0132     12/19/23 2300  ceFEPIme  (MAXIPIME ) 2 g in sodium chloride  0.9 % 100 mL IVPB        2 g 200 mL/hr over 30 Minutes Intravenous  Once 12/19/23 2253 12/19/23 2344   12/19/23 2300  metroNIDAZOLE  (FLAGYL ) IVPB 500 mg        500 mg 100 mL/hr over 60 Minutes Intravenous  Once 12/19/23 2253 12/20/23 0039   12/19/23 2300  vancomycin  (VANCOCIN ) IVPB 1000 mg/200 mL premix  Status:  Discontinued        1,000 mg 200 mL/hr over 60 Minutes Intravenous  Once 12/19/23 2253 12/19/23 2253   12/19/23 2300  vancomycin  (VANCOREADY) IVPB 1750 mg/350 mL        1,750 mg 175 mL/hr over 120 Minutes Intravenous  Once 12/19/23 2253 12/20/23 0248       Subjective: Patient seen and examined at bedside today.  Lying on bed, hemodynamically stable.  Mildly  hypertensive.  Not agitated today.  Off restraints.  Oriented to place.  Has been on room air.  Not in any acute distress.  Objective: Vitals:   12/20/23 2251 12/21/23 0251 12/21/23 0305 12/21/23 1230  BP: (!) 149/65 (!) 167/82  (!) 165/67  Pulse: (!) 59 60  65  Resp: 18 16 16 18   Temp: 99 F (37.2 C) 98 F (36.7 C)  97.6 F (36.4 C)  TempSrc: Oral Axillary  Oral  SpO2: 96% 97%  98%  Weight:      Height:        Intake/Output Summary (Last 24 hours) at 12/21/2023 1258 Last data filed at 12/21/2023 0900 Gross per 24 hour  Intake 1186.89 ml  Output 3375 ml  Net -2188.11 ml   Filed Weights   12/20/23 1506  Weight: 85.5 kg    Examination: General exam: Weak and deconditioned, lying on bed, HEENT: PERRL Respiratory system:  no wheezes or crackles  Cardiovascular system: S1 & S2 heard, RRR.  Gastrointestinal system: Abdomen is nondistended, soft and nontender. Central nervous system: Alert and awake, obese commands, oriented to place Extremities: No edema, no clubbing ,no cyanosis Skin: No rashes, no ulcers,no icterus   GU: FOley   Data Reviewed: I have personally reviewed  following labs and imaging studies  CBC: Recent Labs  Lab 12/19/23 1959 12/20/23 0520 12/21/23 0701  WBC 12.9* 12.3* 7.7  HGB 12.1* 11.1* 11.7*  HCT 37.9* 34.3* 36.5*  MCV 87.7 88.9 88.8  PLT 235 197 171   Basic Metabolic Panel: Recent Labs  Lab 12/19/23 1625 12/20/23 0520 12/21/23 0701  NA 134* 135 140  K 4.2 3.9 3.5  CL 98 100 103  CO2 18* 21* 24  GLUCOSE 167* 159* 147*  BUN 15 13 7*  CREATININE 0.99 0.78 0.64  CALCIUM  10.2 9.3 9.6     Recent Results (from the past 240 hours)  Urine Culture     Status: Abnormal   Collection Time: 12/13/23  4:07 PM   Specimen: Urine, Random  Result Value Ref Range Status   Specimen Description   Final    URINE, RANDOM Performed at [facility], [address], [place], [place] [address]    Special Requests   Final    NONE  Reflexed from [id number] Performed at [facility], [address], [place], [place] [address]    Culture 20,000 COLONIES/mL YEAST (A)  Final   Report Status 12/15/2023 FINAL  Final  Blood culture (routine x 2)     Status: None (Preliminary result)   Collection Time: 12/19/23  8:11 PM   Specimen: BLOOD  Result Value Ref Range Status   Specimen Description   Final    BLOOD SITE NOT SPECIFIED Performed at [facility], [address], [place], [place] [address]    Special Requests   Final    Blood Culture results may not be optimal due to an inadequate volume of blood received in culture bottles BOTTLES DRAWN AEROBIC AND ANAEROBIC Performed at [facility], [address] [place], [place] [address]    Culture   Final    NO GROWTH 2 DAYS Performed at [facility], [address], [place], [place] [address]    Report Status PENDING  Incomplete  Blood culture (routine x 2)     Status: None (Preliminary result)   Collection Time: 12/19/23  8:11 PM   Specimen: BLOOD  Result Value Ref Range Status   Specimen Description   Final    BLOOD SITE NOT SPECIFIED Performed at [facility], [address], [place], [place] [address]    Special Requests   Final    Blood Culture adequate volume BOTTLES DRAWN AEROBIC AND ANAEROBIC Performed at [facility], [address] [place], [place] [address]    Culture   Final    NO GROWTH 2 DAYS Performed at [facility], [address], [place], [place] [address]    Report Status PENDING  Incomplete  Urine Culture     Status: Abnormal   Collection Time: 12/19/23  9:02 PM   Specimen: Urine, Catheterized  Result Value Ref Range Status   Specimen Description   Final    URINE, CATHETERIZED Performed at [facility], [address] [place], [place] [address]    Special Requests   Final    NONE Performed at [facility], [address] [place], [place] [address]    Culture >=100,000 COLONIES/mL YEAST (A)  Final   Report Status 12/20/2023 FINAL  Final     Radiology Studies: CT ABDOMEN PELVIS W CONTRAST Result Date: 12/19/2023 CLINICAL DATA:  Abdomen pain nausea vomiting EXAM: CT ABDOMEN AND PELVIS WITH  CONTRAST TECHNIQUE: Multidetector CT imaging of the abdomen and pelvis was performed using the standard protocol following bolus administration of intravenous contrast. RADIATION DOSE REDUCTION: This exam was performed according to the departmental dose-optimization program which includes automated exposure control, adjustment of the mA and/or kV according to patient size and/or use of iterative reconstruction technique. CONTRAST:  OMNIPAQUE  IOHEXOL  300 MG/ML  SOLN COMPARISON:  CT 12/13/2023, 12/04/2023 FINDINGS: Lower chest: Lung bases demonstrate slight increased patchy airspace disease at the left base. Partially visualized cardiac pacing leads. Coronary vascular calcification. Hepatobiliary: Distended gallbladder with stones. No biliary dilatation or focal hepatic abnormality. Pancreas: Atrophic.  No inflammation or ductal dilatation Spleen: Granuloma Adrenals/Urinary Tract: Adrenal glands are normal. Kidneys show no hydronephrosis. Decompressed diffusely thick walled bladder with catheter. Stomach/Bowel: Stomach within normal limits. No dilated small bowel. No acute bowel wall thickening. Vascular/Lymphatic: Aortic atherosclerosis. No enlarged abdominal or pelvic lymph nodes. Reproductive: Enlarged prostate Other: Negative for pelvic effusion or free air. Musculoskeletal: No acute or suspicious osseous abnormality IMPRESSION: 1. No CT evidence for acute intra-abdominal or pelvic abnormality. 2. Distended gallbladder with stones. 3. Slight increased patchy airspace disease at the left base, atelectasis versus minimal pneumonia. 4. Enlarged prostate. Decompressed urinary bladder but with marked diffuse  bladder wall thickening 5. Aortic atherosclerosis. Aortic Atherosclerosis (ICD10-I70.0). Electronically Signed   By: Luke Bun M.D.   On: 12/19/2023 22:30   CT Head Wo Contrast Result Date: 12/19/2023 CLINICAL DATA:  Intermittent altered mental status for 2 weeks. EXAM: CT HEAD WITHOUT CONTRAST TECHNIQUE: Contiguous axial images were obtained from the base of the skull through the vertex without intravenous contrast. RADIATION DOSE REDUCTION: This exam was performed according to the departmental dose-optimization program which includes automated exposure control, adjustment of the mA and/or kV according to patient size and/or use of iterative reconstruction technique. COMPARISON:  December 13, 2023 FINDINGS: Brain: There is generalize cerebral atrophy with widening of the extra-axial spaces and ventricular dilatation. There are areas of decreased attenuation within the white matter tracts of the supratentorial brain, consistent with microvascular disease changes. Small, chronic bilateral basal ganglia lacunar infarcts are noted. Vascular: Marked severity bilateral cavernous carotid artery calcification is seen. Skull: Normal. Negative for fracture or focal lesion. Sinuses/Orbits: No acute finding. Other: None. IMPRESSION: 1. Generalized cerebral atrophy with chronic white matter small vessel ischemic changes. 2. Small, chronic bilateral basal ganglia lacunar infarcts. 3. No acute intracranial abnormality. Electronically Signed   By: Suzen Dials M.D.   On: 12/19/2023 17:50    Scheduled Meds:  atorvastatin   80 mg Oral QHS   azithromycin   500 mg Oral Daily   Chlorhexidine  Gluconate Cloth  6 each Topical Daily   clopidogrel   75 mg Oral Q breakfast   enoxaparin  (LOVENOX ) injection  40 mg Subcutaneous Q24H   escitalopram   10 mg Oral Daily   feeding supplement  1 Container Oral TID BM   Influenza vac split trivalent PF  0.5 mL Intramuscular Tomorrow-1000   insulin  aspart  0-5 Units Subcutaneous QHS    insulin  aspart  0-6 Units Subcutaneous TID WC   pantoprazole   40 mg Oral QHS   QUEtiapine   25 mg Oral QHS   sodium chloride  flush  3 mL Intravenous Q12H   Continuous Infusions:  sodium chloride  75 mL/hr at 12/21/23 0643   cefTRIAXone  (ROCEPHIN )  IV 2 g (12/21/23 0645)     LOS: 1 day   Ivonne Mustache, MD [facility] P9/01/2024, 12:58 PM

## 2023-12-22 ENCOUNTER — Other Ambulatory Visit (HOSPITAL_COMMUNITY): Payer: Self-pay

## 2023-12-22 DIAGNOSIS — E872 Acidosis, unspecified: Secondary | ICD-10-CM | POA: Diagnosis not present

## 2023-12-22 LAB — BASIC METABOLIC PANEL WITH GFR
Anion gap: 14 (ref 5–15)
BUN: 9 mg/dL (ref 8–23)
CO2: 22 mmol/L (ref 22–32)
Calcium: 9.3 mg/dL (ref 8.9–10.3)
Chloride: 103 mmol/L (ref 98–111)
Creatinine, Ser: 0.69 mg/dL (ref 0.61–1.24)
GFR, Estimated: >60 mL/min (ref >60)
Glucose, Bld: 141 mg/dL — ABNORMAL HIGH (ref 70–99)
Potassium: 3.6 mmol/L (ref 3.5–5.1)
Sodium: 140 mmol/L (ref 135–145)

## 2023-12-22 LAB — CBC
HCT: 35.1 % — ABNORMAL LOW (ref 39.0–52.0)
Hemoglobin: 11.5 g/dL — ABNORMAL LOW (ref 13.0–17.0)
MCH: 28.9 pg (ref 26.0–34.0)
MCHC: 32.8 g/dL (ref 30.0–36.0)
MCV: 88.2 fL (ref 80.0–100.0)
Platelets: 170 K/uL (ref 150–400)
RBC: 3.98 MIL/uL — ABNORMAL LOW (ref 4.22–5.81)
RDW: 19.4 % — ABNORMAL HIGH (ref 11.5–15.5)
WBC: 8.7 K/uL (ref 4.0–10.5)
nRBC: 0 % (ref 0.0–0.2)

## 2023-12-22 LAB — GLUCOSE, CAPILLARY
Glucose-Capillary: 131 mg/dL — ABNORMAL HIGH (ref 70–99)
Glucose-Capillary: 274 mg/dL — ABNORMAL HIGH (ref 70–99)

## 2023-12-22 MED ORDER — AMOXICILLIN-POT CLAVULANATE 875-125 MG PO TABS: 1.0000 | ORAL_TABLET | Freq: Two times a day (BID) | ORAL | Status: DC

## 2023-12-22 MED ORDER — AMOXICILLIN-POT CLAVULANATE 875-125 MG PO TABS
1.0000 | ORAL_TABLET | Freq: Two times a day (BID) | ORAL | 0 refills | Status: DC
  Filled 2023-12-22: qty 6, 3d supply, fill #0

## 2023-12-22 MED ORDER — LOSARTAN POTASSIUM 25 MG PO TABS
25.0000 mg | ORAL_TABLET | Freq: Every day | ORAL | 0 refills | Status: DC
  Filled 2023-12-22: qty 60, 60d supply, fill #0

## 2023-12-22 MED ORDER — METFORMIN HCL 500 MG PO TABS
500.0000 mg | ORAL_TABLET | Freq: Two times a day (BID) | ORAL | 0 refills | Status: DC
  Filled 2023-12-22: qty 60, 30d supply, fill #0

## 2023-12-22 NOTE — TOC Initial Note (Signed)
 Transition of Care [facility]) - Initial/Assessment Note    Patient Details  Name: John [name] MRN: [id number] Date of Birth: May 06, 1945  Transition of [facility] CM/SW Contact:    Bascom Service, RN Phone Number: 12/22/2023, 10:47 AM  Clinical Narrative: faxed out await bed offers.                  Expected Discharge Plan: Skilled Nursing Facility Barriers to Discharge: Continued Medical Work up   Patient Goals and CMS Choice Patient states their goals for this hospitalization and ongoing recovery are:: Rehab CMS Medicare.gov Compare Post Acute Care list provided to:: Patient Represenative (must comment) ([name](spouse)) Choice offered to / list presented to : Spouse [place] ownership interest in [facility].provided to:: Spouse    Expected Discharge Plan and Services   Discharge Planning Services: CM Consult Post Acute Care Choice: Skilled Nursing Facility Living arrangements for the past 2 months: Single Family Home                                      Prior Living Arrangements/Services Living arrangements for the past 2 months: Single Family Home Lives with:: Spouse              Current home services: DME (cane,rw,3n1)    Activities of Daily Living   ADL Screening (condition at time of admission) Independently performs ADLs?: No Does the patient have a NEW difficulty with bathing/dressing/toileting/self-feeding that is expected to last >3 days?: Yes (Initiates electronic notice to provider for possible OT consult) Does the patient have a NEW difficulty with getting in/out of bed, walking, or climbing stairs that is expected to last >3 days?: Yes (Initiates electronic notice to provider for possible PT consult) Does the patient have a NEW difficulty with communication that is expected to last >3 days?: No Is the patient deaf or have difficulty hearing?: Yes Does the patient have difficulty seeing, even when wearing glasses/contacts?: No Does  the patient have difficulty concentrating, remembering, or making decisions?: Yes  Permission Sought/Granted                  Emotional Assessment              Admission diagnosis:  Lactic acidosis [E87.20] Delirium [R41.0] Nausea and vomiting, unspecified vomiting type [R11.2] Patient Active Problem List   Diagnosis Date Noted   AMS (altered mental status) 12/13/2023   Altered mental status 12/09/2023   Nausea & vomiting 12/09/2023   Pacemaker 12/09/2023   Sepsis (HCC) 12/04/2023   CAD S/P percutaneous coronary angioplasty 12/04/2023   HFrEF (heart failure with reduced ejection fraction) (HCC) 08/17/2023   DVT, femoral, chronic (HCC) 08/15/2023   Bladder outlet obstruction 08/15/2023   Sepsis due to Escherichia coli (HCC) 08/15/2023   Debility 08/13/2023   Non-ST elevation (NSTEMI) myocardial infarction (HCC) 08/10/2023   Cholelithiasis 08/07/2023   VTE (venous thromboembolism) 08/07/2023   Lactic acidosis 08/07/2023   Sacral pressure ulcer 08/07/2023   Streptococcal bacteremia 08/07/2023   B12 deficiency 08/07/2023   Iron deficiency anemia 08/07/2023   Severe sepsis (HCC) 08/06/2023   Chronic diastolic CHF (congestive heart failure) (HCC) 08/06/2023   Bacteriuria 08/06/2023   Essential hypertension 06/15/2023   Situational depression 06/15/2023   Protein-calorie malnutrition, severe 05/12/2023   Heart block AV complete (HCC) 05/12/2023   Syncope 05/10/2023   Fall at home, initial encounter 05/08/2023   Hypotension 05/08/2023  Abnormal LFTs 05/08/2023   Anemia 05/08/2023   Coping style affecting medical condition 05/02/2023   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) 04/28/2023   Brainstem infarct, acute (HCC) 04/25/2023   Pressure injury of skin 04/25/2023   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   Acute kidney injury 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) 04/17/2023   DKA (diabetic ketoacidosis) (HCC)  04/16/2023   Type 2 diabetes mellitus with hyperglycemia, with long-term current use of insulin  (HCC) 07/26/2022   Bronchitis 03/20/2021   Acute non-recurrent frontal sinusitis 03/20/2021   Viral URI with cough 01/13/2021   Hyperlipidemia 11/21/2020   Dizziness and giddiness 10/20/2020   Localized swelling of both lower extremities 10/20/2020   Tendinitis of right hip flexor 10/20/2020   Aortic atherosclerosis (HCC) 09/10/2020   Insomnia 09/10/2020   Cognitive impairment 08/18/2020   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   Acute urinary retention 06/04/2020   History of COVID-19 12/10/2019   Physical deconditioning 12/10/2019   Fatigue 12/10/2019   Unsteady gait 12/10/2019   Pneumonia due to COVID-19 virus 10/26/2019   Peripheral neuropathy 10/17/2019   Depression, major, single episode, 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/2019   History of breast cancer    GERD (gastroesophageal reflux disease)    BPH (benign prostatic hyperplasia)    CTS (carpal tunnel syndrome)    Diabetic polyneuropathy associated with type 2 diabetes mellitus (HCC) 09/29/2019   Neuropathic pain of foot 09/29/2019   Foley catheter in place 01/03/2019   Peripheral sensory neuropathy 05/17/2018   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   Hypomagnesemia 01/18/2018   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) 12/13/2017   Axillary adenopathy 12/09/2017   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   S/P cataract extraction and insertion of intraocular lens 01/17/2017   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   PVD (posterior vitreous detachment), left 01/07/2017   Hyperopia of both eyes with astigmatism 11/09/2016   Class 1 obesity due to excess calories without serious comorbidity with body mass index (BMI) of 31.0 to 31.9 in adult 09/17/2016   Encounter for monitoring tamoxifen  therapy 03/05/2016   Insulin  dependent type 2 diabetes mellitus (HCC)  07/02/2015   Diabetes 1.5, 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   Palpitation 09/16/2014   PCP:  [name], DO Pharmacy:   [facility] [place] [facility] [address], [address] [place] [address] Phone: [phone] Fax: [phone]     Social Drivers of Health (SDOH) Social History: SDOH Screenings   Food Insecurity: Patient Unable To Answer (12/20/2023)  Housing: Patient Unable To Answer (12/20/2023)  Transportation Needs: Patient Unable To Answer (12/20/2023)  Utilities: Patient Unable To Answer (12/20/2023)  Alcohol Screen: Low Risk  (01/27/2023)  Depression (PHQ2-9): Low Risk  (01/28/2023)  Financial Resource Strain: Low Risk  (10/03/2023)  Physical Activity: Inactive (10/03/2023)  Social Connections: Unknown (12/20/2023)  Recent Concern: Social Connections - Socially Isolated (12/05/2023)  Stress: No Stress Concern Present (10/03/2023)  Tobacco Use: Low Risk  (12/20/2023)   SDOH Interventions:     Readmission Risk Interventions    12/06/2023    4:03 PM 05/11/2023   12:52 PM 04/22/2023   12:12 PM  Readmission Risk Prevention Plan  Medication Screening   Complete  Transportation Screening Complete Complete Complete  PCP or Specialist Appt within 3-5 Days  Complete   HRI or Home Care Consult  Complete   Social Work Consult for Recovery  Care Planning/Counseling  Complete   Palliative Care Screening  Not Applicable   Medication Review (RN Care Manager) Complete Complete   HRI or Home Care Consult Complete    SW Recovery Care/Counseling Consult Complete    Palliative Care Screening Not Applicable    Skilled Nursing Facility Not Applicable

## 2023-12-22 NOTE — Progress Notes (Signed)
 Physical Therapy Treatment Patient Details Name: John [name] MRN: [id number] DOB: 01-15-1946 Today's Date: 12/22/2023   History of Present Illness John [name] is a 78 y.o. male presents to ED 12/19/23 with N/V/D , in Ed 9/6/ with AMS and vomiting, DC home. PMH: hypertension, hyperlipidemia, type 2 diabetes mellitus, chronic systolic CHF, BPH, urinary retention with Foley catheter, depression, cognitive impairment, breast cancer status postmastectomy and chemotherapy, and recent admission with Streptococcus salivarius bacteremia who presents with generalized weakness, nausea, and vomiting.    PT Comments  Pt in bed, appears to be feeling much better today, agreeable to session. Pt requires HHA to come to sit EOB, good sitting balance, stands with supervision A with cues for hand placement. Once upright is able to progress to 140ft amb at [facility] with RW, cued for pacing to maintain safety. Completes 3 steps per home setup with good control and use of hand rails. Pt requires one standing rest break and continues to return to room and rests in recliner. Pt encouraged to use RW at all times at home and follow up with PT as indicated.     If plan is discharge home, recommend the following: Assistance with cooking/housework;Help with stairs or ramp for entrance;Assist for transportation;A little help with walking and/or transfers;A little help with bathing/dressing/bathroom   Can travel by private vehicle        Equipment Recommendations  None recommended by PT    Recommendations for Other Services       Precautions / Restrictions Precautions Precautions: Fall Restrictions Weight Bearing Restrictions Per Provider Order: No     Mobility  Bed Mobility Overal bed mobility: Needs Assistance Bed Mobility: Supine to Sit     Supine to sit: Contact guard     General bed mobility comments: HHA provided to come to sit EOB    Transfers Overall transfer level: Needs assistance Equipment  used: Rolling walker (2 wheels) Transfers: Sit to/from Stand Sit to Stand: Supervision           General transfer comment: cues for hand placement and not pulling on walker    Ambulation/Gait Ambulation/Gait assistance: Contact guard assist Gait Distance (Feet): 140 Feet Assistive device: Rolling walker (2 wheels) Gait Pattern/deviations: Step-through pattern, Decreased stride length, Trunk flexed Gait velocity: dec     General Gait Details: Pt able to complete functional distance consistent with home setup, slight trunk flexion during amb, requires 1 standing rest break but able to recover well, cued for pacing to maintain safety   Stairs Stairs: Yes Stairs assistance: Contact guard assist Stair Management: Two rails Number of Stairs: 3 General stair comments: mixed reciprocal and non reciprocal, requires inc use of handrails but maintains safety   Wheelchair Mobility     Tilt Bed    Modified Rankin (Stroke Patients Only)       Balance Overall balance assessment: Needs assistance Sitting-balance support: Feet unsupported, No upper extremity supported Sitting balance-Leahy Scale: Good     Standing balance support: Reliant on assistive device for balance, During functional activity Standing balance-Leahy Scale: Good                              Communication Communication Communication: Impaired Factors Affecting Communication: Hearing impaired  Cognition Arousal: Alert Behavior During Therapy: WFL for tasks assessed/performed   PT - Cognitive impairments: No apparent impairments  Following commands: Intact      Cueing Cueing Techniques: Verbal cues, Tactile cues, Gestural cues  Exercises      General Comments        Pertinent Vitals/Pain Pain Assessment Pain Assessment: No/denies pain    Home Living                          Prior Function            PT Goals (current goals can now  be found in the care plan section) Acute Rehab PT Goals PT Goal Formulation: With patient (wants to go home) Time For Goal Achievement: 01/03/24 Potential to Achieve Goals: Fair Progress towards PT goals: Progressing toward goals    Frequency    Min 2X/week      PT Plan      Co-evaluation              AM-PAC PT 6 Clicks Mobility   Outcome Measure  Help needed turning from your back to your side while in a flat bed without using bedrails?: A Little Help needed moving from lying on your back to sitting on the side of a flat bed without using bedrails?: A Little Help needed moving to and from a bed to a chair (including a wheelchair)?: A Little Help needed standing up from a chair using your arms (e.g., wheelchair or bedside chair)?: A Little Help needed to walk in hospital room?: A Little Help needed climbing 3-5 steps with a railing? : A Little 6 Click Score: 18    End of Session Equipment Utilized During Treatment: Gait belt Activity Tolerance: Patient tolerated treatment well Patient left: in chair;with call bell/phone within reach Nurse Communication: Mobility status PT Visit Diagnosis: Unsteadiness on feet (R26.81);Difficulty in walking, not elsewhere classified (R26.2)     Time: 8695-8679 PT Time Calculation (min) (ACUTE ONLY): 16 min  Charges:    $Gait Training: 8-22 mins PT General Charges $$ ACUTE PT VISIT: 1 Visit                     Stann, PT Acute Rehabilitation Services Office: [phone] 12/22/2023   Stann [name] Ohara 12/22/2023, 1:53 PM

## 2023-12-22 NOTE — Discharge Summary (Addendum)
 Physician Discharge Summary  John [name] FMW:[id number] DOB: [birth year] DOA: 12/19/2023  PCP: [name], DO  Admit date: 12/19/2023 Discharge date: 12/22/2023  Admitted From: Home Disposition:  Home  Discharge Condition:Stable CODE STATUS:FULL Diet recommendation: Carb Modified  Brief/Interim Summary: Patient is a 78 year old male with history of hypertension, hyperlipidemia, type 2 diabetes, chronic systolic CHF, BPH, urinary retention with Foley catheter, depression, cognitive impairment, right breast cancer status post mastectomy and chemotherapy( follows with [facility]), recent admission for a Streptococcus salivarius bacteremia on linezolid  at home who presented with generalized weakness, nausea, vomiting.  He was just discharged from here on 12/15/23 when he was admitted for altered mental status. patient recently stopped taking linezolid  because of nausea, vomiting, diarrhea.  On presentation, he was afebrile, saturating well on room air, hypertensive.  WBC count was 12.9, lactate of 4.1.  CT head did not show any acute findings.  CT abdomen/pelvis did not show any acute findings in the abdomen or pelvis but showed distended gallbladder with stones,slight increased patchy airspace disease at the left bass suggesting atelectasis versus minimal pneumonia,decompressed urinary bladder but with marked diffuse bladder wall thickening.  Hospital course remarkable for agitation requiring restraints.  Encephalopathy significantly better now.  He is mostly oriented today.  Physical therapy recommended home health.  Medically stable for discharge home.  Following problems were addressed during the hospitalization:  Nausea/vomiting: No acute findings on abdomen pelvis except for cholelithiasis.  These symptoms have resolved   Left lower lobe pneumonia/lactic acidosis: No fever, has mild leukocytosis.  As per the wife, he was coughing at home.  CT abdomen/pelvis showed possible left  lower lobe opacity versus atelectasis.  Currently on ceftriaxone  and azithromycin .  We can change to Augmentin  on discharge.   Recent  of history of streptococcus bacteremia: History of Staphylococcus salivarius bacteremia .  TEE was negative at that time.    He was discharged on 8/30 with plan to continue linezolid  for 9 more days(14 days total course).  Per wife, he just missed day of antibiotic since then.  I guess he has completed the antibiotics course for bacteremia.   History of HFrEF/CAD: Appeared dehydrated on presentation.  Started on IV fluid.  No anginal symptoms.  On Lipitor , Plavix  at home.  Has pacemaker.  Recent EF 25 to 35%.  Now appears euvolemic.  IV fluid discontinued Completed 6 months of Eliquis  for provoked PE.   History of chronic hypertension: Previously on midodrine .  Not taking currently.  Currently hypertensive.  Will start on losartan . continue labetalol  as needed.   Diabetes type 2:  Monitor blood sugars at home,continue metformin .  Recent A1c of 7.5   Chronic urinary retention: Has  Foley catheter.  Continue Foley care.  Follow-up with urology as an outpatient   History of depression: On Lexapro    History of right breast cancer: Follows with [facility].  Status post mastectomy, chemotherapy.  Stopped tamoxifen  due to history of PE   Dementia/cognitive impairment: Concern for hospital-acquired delirium on last hospitalization. He was discharged on 12/15/2023 from here when he was admitted for altered mental status.  He was also hospitalized from 12/04/2023 to 12/10/2023 for acute metabolic encephalopathy, sepsis secondary to E faecalis UTI, Streptococcus bacteremia.  Wife  does not believe he ever returned to his baseline mental status following last hospitalization .  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  Last CT head did not show any acute findings.  Previous MRI was was concerning for Alzheimer's dementia.  We recommend outpatient workup with  neurology for underlying  dementia/cognitive workup. Lives with wife.  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 Name and number of the provider group has been attached.  Call for appointment   Increase activity slowly   Complete by: As directed       Allergies as of 12/22/2023       Reactions   Ramipril Anaphylaxis   Adhesive [tape] Rash   Other Diarrhea   Severe intolerance to Chemotherapy in the past.   Sertraline Other (See Comments)   Extreme headaches        Medication List     STOP taking these medications    insulin  glargine 100 UNIT/ML injection Commonly known as: LANTUS        TAKE these medications    amoxicillin -clavulanate 875-125 MG tablet Commonly known as: AUGMENTIN  Take 1 tablet by mouth every 12 (twelve) hours for 3 days. Start taking on: December 23, 2023   atorvastatin  80 MG tablet Commonly known as: LIPITOR  Take 1 tablet (80 mg total) by mouth at bedtime.   clopidogrel  75 MG tablet Commonly known as: PLAVIX  Take 1 tablet (75 mg total)  by mouth daily with breakfast.   clotrimazole -betamethasone  cream Commonly known as: LOTRISONE  Apply 1 Application topically daily. What changed:  when to take this reasons to take this   cyanocobalamin  1000 MCG tablet Commonly known as: VITAMIN B12 Take 1 tablet (1,000 mcg total) by mouth daily. What changed: when to take this   escitalopram  10 MG tablet Commonly known as: Lexapro  Take 1 tablet (10 mg total) by mouth daily.   FeroSul 325 (65 Fe) MG tablet Generic drug: ferrous sulfate  Take 1 tablet (325 mg total) by mouth daily with breakfast.   finasteride  5 MG tablet Commonly known as: PROSCAR  Take 1 tablet (5 mg total) by mouth daily. *Need appointment for future refills.*   hyoscyamine  0.125 MG SL tablet Commonly known as: LEVSIN  SL Place 1 tablet (0.125 mg total) under the tongue every 4 (four) hours as needed for bladder spasms.   loperamide  2 MG tablet Commonly known as: IMODIUM  A-D Take 2 mg by mouth as needed for diarrhea or loose stools.   losartan  25 MG tablet Commonly known as: COZAAR  Take 1 tablet (25 mg total) by mouth daily. Start taking on: December 23, 2023   Magnesium  Citrate 200 MG Tabs Take 200 mg by mouth 2 (two) times daily before a meal.   metFORMIN  500 MG tablet Commonly known as: GLUCOPHAGE  Take 1 tablet (500 mg total) by mouth 2 (two) times  daily with a meal.   multivitamin tablet Take 1 tablet by mouth 2 (two) times a week.   pantoprazole  40 MG tablet Commonly known as: PROTONIX  Take 1 tablet (40 mg total) by mouth at bedtime.   Stool Softener/Laxative 50-8.6 MG tablet Generic drug: senna-docusate Take 1 tablet by mouth 2 (two) times daily. What changed:  when to take this reasons to take this   vitamin C 1000 MG tablet Take 1,000 mg by mouth 2 (two) times a week.        Follow-up Information     [name], DO. Schedule an appointment as soon as possible for a visit in 1 week(s).   Specialty: Family  Medicine Contact information: [address] [place] [address] [phone]                Allergies  Allergen Reactions   Ramipril Anaphylaxis   Adhesive [Tape] Rash   Other Diarrhea    Severe intolerance to Chemotherapy in the past.   Sertraline Other (See Comments)    Extreme headaches    Consultations: None   Procedures/Studies: CT ABDOMEN PELVIS W CONTRAST Result Date: 12/19/2023 CLINICAL DATA:  Abdomen pain nausea vomiting EXAM: CT ABDOMEN AND PELVIS WITH CONTRAST TECHNIQUE: Multidetector CT imaging of the abdomen and pelvis was performed using the standard protocol following bolus administration of intravenous contrast. RADIATION DOSE REDUCTION: This exam was performed according to the departmental dose-optimization program which includes automated exposure control, adjustment of the mA and/or kV according to patient size and/or use of iterative reconstruction technique. CONTRAST:  OMNIPAQUE  IOHEXOL  300 MG/ML  SOLN COMPARISON:  CT 12/13/2023, 12/04/2023 FINDINGS: Lower chest: Lung bases demonstrate slight increased patchy airspace disease at the left base. Partially visualized cardiac pacing leads. Coronary vascular calcification. Hepatobiliary: Distended gallbladder with stones. No biliary dilatation or focal hepatic abnormality. Pancreas: Atrophic.  No inflammation or ductal dilatation Spleen: Granuloma Adrenals/Urinary Tract: Adrenal glands are normal. Kidneys show no hydronephrosis. Decompressed diffusely thick walled bladder with catheter. Stomach/Bowel: Stomach within normal limits. No dilated small bowel. No acute bowel wall thickening. Vascular/Lymphatic: Aortic atherosclerosis. No enlarged abdominal or pelvic lymph nodes. Reproductive: Enlarged prostate Other: Negative for pelvic effusion or free air. Musculoskeletal: No acute or suspicious osseous abnormality IMPRESSION: 1. No CT evidence for acute intra-abdominal or pelvic abnormality. 2.  Distended gallbladder with stones. 3. Slight increased patchy airspace disease at the left base, atelectasis versus minimal pneumonia. 4. Enlarged prostate. Decompressed urinary bladder but with marked diffuse bladder wall thickening 5. Aortic atherosclerosis. Aortic Atherosclerosis (ICD10-I70.0). Electronically Signed   By: Luke Bun M.D.   On: 12/19/2023 22:30   CT Head Wo Contrast Result Date: 12/19/2023 CLINICAL DATA:  Intermittent altered mental status for 2 weeks. EXAM: CT HEAD WITHOUT CONTRAST TECHNIQUE: Contiguous axial images were obtained from the base of the skull through the vertex without intravenous contrast. RADIATION DOSE REDUCTION: This exam was performed according to the departmental dose-optimization program which includes automated exposure control, adjustment of the mA and/or kV according to patient size and/or use of iterative reconstruction technique. COMPARISON:  December 13, 2023 FINDINGS: Brain: There is generalize cerebral atrophy with widening of the extra-axial spaces and ventricular dilatation. There are areas of decreased attenuation within the white matter tracts of the supratentorial brain, consistent with microvascular disease changes. Small, chronic bilateral basal ganglia lacunar infarcts are noted. Vascular: Marked severity bilateral cavernous carotid artery calcification is seen. Skull: Normal. Negative for fracture or focal lesion. Sinuses/Orbits: No  acute finding. Other: None. IMPRESSION: 1. Generalized cerebral atrophy with chronic white matter small vessel ischemic changes. 2. Small, chronic bilateral basal ganglia lacunar infarcts. 3. No acute intracranial abnormality. Electronically Signed   By: Suzen Dials M.D.   On: 12/19/2023 17:50   CT Head Wo Contrast Result Date: 12/13/2023 CLINICAL DATA:  Mental status change, unknown cause EXAM: CT HEAD WITHOUT CONTRAST TECHNIQUE: Contiguous axial images were obtained from the base of the skull through the vertex  without intravenous contrast. RADIATION DOSE REDUCTION: This exam was performed according to the departmental dose-optimization program which includes automated exposure control, adjustment of the mA and/or kV according to patient size and/or use of iterative reconstruction technique. COMPARISON:  Head CT 12/04/2023 FINDINGS: Brain: No intracranial hemorrhage, mass effect, or midline shift. Stable degree of atrophy and chronic small vessel ischemia. Chronic lacunar infarcts in the basal ganglia. No hydrocephalus. The basilar cisterns are patent. No evidence of territorial infarct or acute ischemia. No extra-axial or intracranial fluid collection. Vascular: Atherosclerosis of skullbase vasculature without hyperdense vessel or abnormal calcification. Skull: No fracture or focal lesion. Sinuses/Orbits: No acute findings.  Bilateral cataract resection Other: None. IMPRESSION: 1. No acute intracranial abnormality. 2. 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 Electronically Signed   By: Andrea Gasman M.D.   On: 12/13/2023 15:37   CT ABDOMEN PELVIS W CONTRAST Result Date: 12/13/2023 EXAM: CT ABDOMEN AND PELVIS WITH CONTRAST 12/13/2023 02:39:23 PM TECHNIQUE: CT of the abdomen and pelvis was performed with the administration of intravenous contrast. Multiplanar reformatted images are provided for review. Automated exposure control, iterative reconstruction, and/or weight-based adjustment of the mA/kV was utilized to reduce the radiation dose to as low as reasonably achievable. COMPARISON: 12/04/2023 CLINICAL HISTORY: Bowel obstruction suspected. Pt bib EMS from home. Pt was at [facility] dc Sunday after sepsis. Yesterday vomiting. Today constipated x1 wk possibly. Refused meds for constipation while at [facility]. Tried to put a plate in the toaster so family thinks pt is confused. Pt refused to answer ; A\T\O questions. Foley in place. Hx Diabetes. FINDINGS: LOWER CHEST: Trace bilateral pleural effusions. Transvenous pacing  leads partially visualized, grossly stable. LIVER: Trace perihepatic fluid. GALLBLADDER AND BILE DUCTS: Gallbladder distended, with several layering partially calcified stones measuring up to 1.3 cm. No definite wall thickening or regional inflammatory change. No biliary ductal dilatation. SPLEEN: Normal size with scattered calcified punctate granulomas. PANCREAS: Diffuse pancreatic parenchymal atrophy without ductal dilatation, mass, or regional inflammatory change. ADRENAL GLANDS: Normal appearance. No mass. KIDNEYS, URETERS AND BLADDER: Urinary bladder decompressed by Foley catheter, appears diffusely thick walled. No stones in the kidneys or ureters. No hydronephrosis. No perinephric or periureteral stranding. GI AND BOWEL: Stomach demonstrates no acute abnormality. There is no bowel obstruction. No bowel wall thickening. PERITONEUM AND RETROPERITONEUM: No free air. VASCULATURE: Scattered calcified aortoiliac atheromatous plaque without aneurysm or stenosis. LYMPH NODES: No lymphadenopathy. REPRODUCTIVE ORGANS: Prostate enlargement with scattered coarse calcifications. BONES AND SOFT TISSUES: Bilateral hip degenerative joint disease. Bilateral pelvic phleboliths. No acute osseous abnormality. No focal soft tissue abnormality. IMPRESSION: 1. No evidence of bowel obstruction or other acute findings . 2. Cholelithiasis Electronically signed by: Katheleen Faes MD 12/13/2023 03:29 PM EDT RP Workstation: [id number]   DG Chest Portable 1 View Result Date: 12/13/2023 CLINICAL DATA:  Altered mental status EXAM: PORTABLE CHEST 1 VIEW COMPARISON:  12/04/2023 FINDINGS: The patient is rotated to the left on today's radiograph, reducing diagnostic sensitivity and specificity. Dual lead pacer noted. Atherosclerotic calcification of the aortic arch. Heart size  currently within normal limits. The lungs appear clear.  Mild thoracic spondylosis. IMPRESSION: 1. No acute findings. 2. Aortic Atherosclerosis (ICD10-I70.0).  Electronically Signed   By: Ryan Salvage M.D.   On: 12/13/2023 12:23   ECHO TEE Result Date: 12/08/2023    TRANSESOPHOGEAL ECHO REPORT   Patient Name:   John [name] [place]ado Date of Exam: 12/08/2023 Medical Rec #:  [id number]        Height:       72.0 in Accession #:    [id number]       Weight:       191.8 lb Date of Birth:  09-Jan-1946        BSA:          2.093 m Patient Age:    78 years         BP:           167/82 mmHg Patient Gender: M                HR:           60 bpm. Exam Location:  Inpatient Procedure: Transesophageal Echo, Cardiac Doppler and Color Doppler (Both            Spectral and Color Flow Doppler were utilized during procedure). Indications:     Endocarditis  History:         Patient has prior history of Echocardiogram examinations, most                  recent 08/11/2023. HFrEF and CHF, CAD, Chronic Kidney Disease;                  Risk Factors:Diabetes, Dyslipidemia and Hypertension.  Sonographer:     Koleen Popper RDCS Referring Phys:  [id number] LEONTINE SAILOR LOCKWOOD Diagnosing Phys: Annabella Scarce MD PROCEDURE: After discussion of the risks and benefits of a TEE, an informed consent was obtained from the patient. The transesophogeal probe was passed without difficulty through the esophogus of the patient. Imaged were obtained with the patient in a left lateral decubitus position. Sedation performed by different physician. The patient was monitored while under deep sedation. Anesthestetic sedation was provided intravenously by Anesthesiology: 60mg  of Propofol , 20mg  of Lidocaine . The patient's vital  signs; including heart rate, blood pressure, and oxygen saturation; remained stable throughout the procedure. The patient developed no complications during the procedure.  IMPRESSIONS  1. Left ventricular ejection fraction, by estimation, is 30 to 35%. The left ventricle has moderately decreased function. The left ventricle demonstrates global hypokinesis.  2. Right ventricular systolic function is  normal. The right ventricular size is normal.  3. No left atrial/left atrial appendage thrombus was detected.  4. The mitral valve is normal in structure. Mild mitral valve regurgitation. No evidence of mitral stenosis. There is mild prolapse of the middle scallop of the posterior leaflet of the mitral valve.  5. The aortic valve is tricuspid. Aortic valve regurgitation is trivial. No aortic stenosis is present.  6. There is mild (Grade II) protruding plaque involving the aortic root. Conclusion(s)/Recommendation(s): No evidence of vegetation/infective endocarditis on this transesophageael echocardiogram. FINDINGS  Left Ventricle: Left ventricular ejection fraction, by estimation, is 30 to 35%. The left ventricle has moderately decreased function. The left ventricle demonstrates global hypokinesis. The left ventricular internal cavity size was normal in size. There is no left ventricular hypertrophy. Right Ventricle: The right ventricular size is normal. No increase in right ventricular wall thickness. Right ventricular systolic function is normal. Left  [facility]: Left atrial size was normal in size. No left atrial/left atrial appendage thrombus was detected. Right [facility]: Right atrial size was normal in size. Pericardium: There is no evidence of pericardial effusion. Mitral Valve: The mitral valve is normal in structure. There is mild prolapse of the middle scallop of the posterior leaflet of the mitral valve. Mild mitral valve regurgitation. No evidence of mitral valve stenosis. Tricuspid Valve: The tricuspid valve is normal in structure. Tricuspid valve regurgitation is mild . No evidence of tricuspid stenosis. Aortic Valve: The aortic valve is tricuspid. Aortic valve regurgitation is trivial. No aortic stenosis is present. Pulmonic Valve: The pulmonic valve was normal in structure. Pulmonic valve regurgitation is not visualized. No evidence of pulmonic stenosis. Aorta: The aortic root is normal in size and  structure. There is mild (Grade II) protruding plaque involving the aortic root. IAS/Shunts: No atrial level shunt detected by color flow Doppler. Additional Comments: Spectral Doppler performed. LEFT VENTRICLE PLAX 2D LVOT diam:     2.00 cm LVOT Area:     3.14 cm   AORTA Ao Root diam: 3.10 cm Ao Asc diam:  3.50 cm TRICUSPID VALVE TR Peak grad:   14.3 mmHg TR Vmax:        189.00 cm/s  SHUNTS Systemic Diam: 2.00 cm Annabella Scarce MD Electronically signed by Annabella Scarce MD Signature Date/Time: 12/08/2023/3:58:52 PM    Final    EP STUDY Result Date: 12/08/2023 See surgical note for result.  CT Angio Chest PE W and/or Wo Contrast Result Date: 12/04/2023 CLINICAL DATA:  Nausea and vomiting with chest and abdominal pain, initial encounter EXAM: CT ANGIOGRAPHY CHEST CT ABDOMEN AND PELVIS WITH CONTRAST TECHNIQUE: Multidetector CT imaging of the chest was performed using the standard protocol during bolus administration of intravenous contrast. Multiplanar CT image reconstructions and MIPs were obtained to evaluate the vascular anatomy. Multidetector CT imaging of the abdomen and pelvis was performed using the standard protocol during bolus administration of intravenous contrast. RADIATION DOSE REDUCTION: This exam was performed according to the departmental dose-optimization program which includes automated exposure control, adjustment of the mA and/or kV according to patient size and/or use of iterative reconstruction technique. CONTRAST:  75mL OMNIPAQUE  IOHEXOL  350 MG/ML SOLN COMPARISON:  None Available. FINDINGS: CTA CHEST FINDINGS Cardiovascular: Thoracic aorta shows atherosclerotic calcifications without aneurysmal dilatation or dissection. No cardiac enlargement is seen. Coronary calcifications are noted. The pulmonary artery shows a normal branching pattern bilaterally. No filling defect to suggest pulmonary embolism is noted. Mediastinum/Nodes: Thoracic inlet is within normal limits. No hilar or  mediastinal adenopathy is noted. The esophagus as visualized is within normal limits. Lungs/Pleura: Lungs are well aerated bilaterally. No focal infiltrate or sizable effusion is seen. Persistent pleural based density is noted in the anterolateral aspect of the right upper lobe best seen on image number 34 of series 9 stable from the prior exam and likely related to prior radiation therapy. Some scarring is noted in the right lung base. No focal nodule is seen. Musculoskeletal: Degenerative changes of the thoracic spine are noted. Review of the MIP images confirms the above findings. CT ABDOMEN and PELVIS FINDINGS Hepatobiliary: Dependent gallstones are noted. No wall thickening or pericholecystic fluid is noted. The liver is within normal limits. Pancreas: Unremarkable. No pancreatic ductal dilatation or surrounding inflammatory changes. Spleen: Scattered calcified granulomas are noted. Adrenals/Urinary Tract: The adrenal glands again shows some nodular thickening although no discrete lesion is noted. The kidneys demonstrate a normal enhancement pattern bilaterally. No renal calculi or  obstructive changes are seen. The bladder is decompressed by Foley catheter. Stomach/Bowel: Colon is predominately decompressed. No inflammatory changes are seen. The appendix is not well visualized and may have been surgically removed. No inflammatory changes to suggest appendicitis are noted. Stomach and small bowel are within normal limits. Vascular/Lymphatic: Aortic atherosclerosis. No enlarged abdominal or pelvic lymph nodes. Reproductive: Prostate is unremarkable. Other: No abdominal wall hernia or abnormality. No abdominopelvic ascites. Musculoskeletal: Mild degenerative changes of lumbar spine are noted. Review of the MIP images confirms the above findings. IMPRESSION: CTA of the chest: No evidence of pulmonary embolism. Chronic scarring in the right lung. CT of the abdomen and pelvis: Cholelithiasis without complicating  factors. Changes of prior granulomatous disease. No acute abnormality is seen. Electronically Signed   By: Oneil Devonshire M.D.   On: 12/04/2023 21:25   CT ABDOMEN PELVIS W CONTRAST Result Date: 12/04/2023 CLINICAL DATA:  Nausea and vomiting with chest and abdominal pain, initial encounter EXAM: CT ANGIOGRAPHY CHEST CT ABDOMEN AND PELVIS WITH CONTRAST TECHNIQUE: Multidetector CT imaging of the chest was performed using the standard protocol during bolus administration of intravenous contrast. Multiplanar CT image reconstructions and MIPs were obtained to evaluate the vascular anatomy. Multidetector CT imaging of the abdomen and pelvis was performed using the standard protocol during bolus administration of intravenous contrast. RADIATION DOSE REDUCTION: This exam was performed according to the departmental dose-optimization program which includes automated exposure control, adjustment of the mA and/or kV according to patient size and/or use of iterative reconstruction technique. CONTRAST:  75mL OMNIPAQUE  IOHEXOL  350 MG/ML SOLN COMPARISON:  None Available. FINDINGS: CTA CHEST FINDINGS Cardiovascular: Thoracic aorta shows atherosclerotic calcifications without aneurysmal dilatation or dissection. No cardiac enlargement is seen. Coronary calcifications are noted. The pulmonary artery shows a normal branching pattern bilaterally. No filling defect to suggest pulmonary embolism is noted. Mediastinum/Nodes: Thoracic inlet is within normal limits. No hilar or mediastinal adenopathy is noted. The esophagus as visualized is within normal limits. Lungs/Pleura: Lungs are well aerated bilaterally. No focal infiltrate or sizable effusion is seen. Persistent pleural based density is noted in the anterolateral aspect of the right upper lobe best seen on image number 34 of series 9 stable from the prior exam and likely related to prior radiation therapy. Some scarring is noted in the right lung base. No focal nodule is seen.  Musculoskeletal: Degenerative changes of the thoracic spine are noted. Review of the MIP images confirms the above findings. CT ABDOMEN and PELVIS FINDINGS Hepatobiliary: Dependent gallstones are noted. No wall thickening or pericholecystic fluid is noted. The liver is within normal limits. Pancreas: Unremarkable. No pancreatic ductal dilatation or surrounding inflammatory changes. Spleen: Scattered calcified granulomas are noted. Adrenals/Urinary Tract: The adrenal glands again shows some nodular thickening although no discrete lesion is noted. The kidneys demonstrate a normal enhancement pattern bilaterally. No renal calculi or obstructive changes are seen. The bladder is decompressed by Foley catheter. Stomach/Bowel: Colon is predominately decompressed. No inflammatory changes are seen. The appendix is not well visualized and may have been surgically removed. No inflammatory changes to suggest appendicitis are noted. Stomach and small bowel are within normal limits. Vascular/Lymphatic: Aortic atherosclerosis. No enlarged abdominal or pelvic lymph nodes. Reproductive: Prostate is unremarkable. Other: No abdominal wall hernia or abnormality. No abdominopelvic ascites. Musculoskeletal: Mild degenerative changes of lumbar spine are noted. Review of the MIP images confirms the above findings. IMPRESSION: CTA of the chest: No evidence of pulmonary embolism. Chronic scarring in the right lung. CT of the abdomen and  pelvis: Cholelithiasis without complicating factors. Changes of prior granulomatous disease. No acute abnormality is seen. Electronically Signed   By: Oneil Devonshire M.D.   On: 12/04/2023 21:25   CT Head Wo Contrast Result Date: 12/04/2023 CLINICAL DATA:  Delirium EXAM: CT HEAD WITHOUT CONTRAST TECHNIQUE: Contiguous axial images were obtained from the base of the skull through the vertex without intravenous contrast. RADIATION DOSE REDUCTION: This exam was performed according to the departmental  dose-optimization program which includes automated exposure control, adjustment of the mA and/or kV according to patient size and/or use of iterative reconstruction technique. COMPARISON:  CT head 10/26/2023 FINDINGS: Brain: Cerebral ventricle sizes are concordant with the degree of cerebral volume loss. Patchy and confluent areas of decreased attenuation are noted throughout the deep and periventricular white matter of the cerebral hemispheres bilaterally, compatible with chronic microvascular ischemic disease. No evidence of large-territorial acute infarction. No parenchymal hemorrhage. No mass lesion. No extra-axial collection. No mass effect or midline shift. No hydrocephalus. Basilar cisterns are patent. Vascular: No hyperdense vessel. Atherosclerotic calcifications are present within the cavernous internal carotid arteries. Skull: No acute fracture or focal lesion. Sinuses/Orbits: Paranasal sinuses and mastoid air cells are clear. The orbits are unremarkable. Other: None. IMPRESSION: No acute intracranial abnormality. Electronically Signed   By: Morgane  Naveau M.D.   On: 12/04/2023 21:21   DG Chest Port 1 View Result Date: 12/04/2023 CLINICAL DATA:  Vomiting. 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 The heart size and mediastinal contours are within normal limits. Mild calcification of the aortic arch is noted. Both lungs are clear. The visualized skeletal structures are unremarkable. IMPRESSION: No active disease. Electronically Signed   By: Suzen Dials M.D.   On: 12/04/2023 20:05      Subjective: Patient seen and examined at bedside today.  Hemodynamically stable.  Comfortable.  Lying in bed.  Alert and awake today.  Mostly oriented.  Denies new complaints.  Ambulated very well with the physical therapist.  Kandis to go home.  I called his wife and discussed about discharge planning  Discharge Exam: Vitals:   12/22/23 0158  12/22/23 0456  BP: (!) 174/69 (!) 148/67  Pulse:  69  Resp: 18 16  Temp:  98 F (36.7 C)  SpO2:  97%   Vitals:   12/21/23 2308 12/22/23 0128 12/22/23 0158 12/22/23 0456  BP: (!) 167/64 (!) 164/67 (!) 174/69 (!) 148/67  Pulse: 66 66  69  Resp:   18 16  Temp:    98 F (36.7 C)  TempSrc:    Oral  SpO2:    97%  Weight:      Height:        General: Pt is alert, awake, not in acute distress, obese Cardiovascular: RRR, S1/S2 +, no rubs, no gallops Respiratory: CTA bilaterally, no wheezing, no rhonchi Abdominal: Soft, NT, ND, bowel sounds + Extremities: no edema, no cyanosis GU: Foley    The results of significant diagnostics from this hospitalization (including imaging, microbiology, ancillary and laboratory) are listed below for reference.     Microbiology: Recent Results (from the past 240 hours)  Urine Culture     Status: Abnormal   Collection Time: 12/13/23  4:07 PM   Specimen: Urine, Random  Result Value Ref Range Status   Specimen Description   Final    URINE, RANDOM Performed at [facility], [address], [place], [place] [address]    Special  Requests   Final    NONE Reflexed from [id number] Performed at [facility], [address] [place], [place] [address]    Culture 20,000 COLONIES/mL YEAST (A)  Final   Report Status 12/15/2023 FINAL  Final  Blood culture (routine x 2)     Status: None (Preliminary result)   Collection Time: 12/19/23  8:11 PM   Specimen: BLOOD  Result Value Ref Range Status   Specimen Description   Final    BLOOD SITE NOT SPECIFIED Performed at [facility], [address], [place], [place] [address]    Special Requests   Final    Blood Culture results may not be optimal due to an inadequate volume of blood received in culture bottles BOTTLES DRAWN AEROBIC AND ANAEROBIC Performed at [facility], [address], [place], [place] [address]    Culture   Final     NO GROWTH 3 DAYS Performed at [facility], [address] [place], [place] [address]    Report Status PENDING  Incomplete  Blood culture (routine x 2)     Status: None (Preliminary result)   Collection Time: 12/19/23  8:11 PM   Specimen: BLOOD  Result Value Ref Range Status   Specimen Description   Final    BLOOD SITE NOT SPECIFIED Performed at [facility], [address] [place], [place] [address]    Special Requests   Final    Blood Culture adequate volume BOTTLES DRAWN AEROBIC AND ANAEROBIC Performed at [facility], [address] [place], [place] [address]    Culture   Final    NO GROWTH 3 DAYS Performed at [facility], [address], [place], [place] [address]    Report Status PENDING  Incomplete  Urine Culture     Status: Abnormal   Collection Time: 12/19/23  9:02 PM   Specimen: Urine, Catheterized  Result Value Ref Range Status   Specimen Description   Final    URINE, CATHETERIZED Performed at [facility] [address] [place], [place] [address]    Special Requests   Final    NONE Performed at [facility], [address] [place], [place] [address]    Culture >=100,000 COLONIES/mL YEAST (A)  Final   Report Status 12/20/2023 FINAL  Final     Labs: BNP (last 3 results) Recent Labs    08/06/23 1713  BNP 307.2*   Basic Metabolic Panel: Recent Labs  Lab 12/19/23 1625 12/20/23 0520 12/21/23 0701 12/22/23 0459  NA 134* 135 140 140  K 4.2 3.9 3.5 3.6  CL 98 100 103 103  CO2 18* 21* 24 22  GLUCOSE 167* 159* 147* 141*  BUN 15 13 7* 9  CREATININE 0.99 0.78 0.64 0.69  CALCIUM  10.2 9.3 9.6 9.3   Liver Function Tests: Recent Labs  Lab 12/19/23 1959  AST 30  ALT 33  ALKPHOS 70  BILITOT 2.2*  PROT 6.1*  ALBUMIN 3.9   No results for input(s): LIPASE, AMYLASE in the last 168 hours. Recent Labs  Lab 12/19/23 2138  AMMONIA 18   CBC: Recent Labs   Lab 12/19/23 1959 12/20/23 0520 12/21/23 0701 12/22/23 0459  WBC 12.9* 12.3* 7.7 8.7  HGB 12.1* 11.1* 11.7* 11.5*  HCT 37.9* 34.3* 36.5* 35.1*  MCV 87.7 88.9 88.8 88.2  PLT 235 197 171 170   Cardiac Enzymes: No results for input(s): CKTOTAL, CKMB, CKMBINDEX, TROPONINI  in the last 168 hours. BNP: Invalid input(s): POCBNP CBG: Recent Labs  Lab 12/21/23 1147 12/21/23 1643 12/21/23 2138 12/22/23 0737 12/22/23 1147  GLUCAP 181* 138* 130* 131* 274*   D-Dimer No results for input(s): DDIMER in the last 72 hours. Hgb A1c No results for input(s): HGBA1C in the last 72 hours. Lipid Profile No results for input(s): CHOL, HDL, LDLCALC, TRIG, CHOLHDL, LDLDIRECT in the last 72 hours. Thyroid  function studies No results for input(s): TSH, T4TOTAL, T3FREE, THYROIDAB in the last 72 hours.  Invalid input(s): FREET3 Anemia work up No results for input(s): VITAMINB12, FOLATE, FERRITIN, TIBC, IRON, RETICCTPCT in the last 72 hours. Urinalysis    Component Value Date/Time   COLORURINE YELLOW 12/19/2023 2102   APPEARANCEUR TURBID (A) 12/19/2023 2102   LABSPEC 1.020 12/19/2023 2102   PHURINE 5.0 12/19/2023 2102   GLUCOSEU NEGATIVE 12/19/2023 2102   HGBUR NEGATIVE 12/19/2023 2102   BILIRUBINUR NEGATIVE 12/19/2023 2102   KETONESUR 5 (A) 12/19/2023 2102   PROTEINUR 30 (A) 12/19/2023 2102   NITRITE NEGATIVE 12/19/2023 2102   LEUKOCYTESUR LARGE (A) 12/19/2023 2102   Sepsis Labs Recent Labs  Lab 12/19/23 1959 12/20/23 0520 12/21/23 0701 12/22/23 0459  WBC 12.9* 12.3* 7.7 8.7   Microbiology Recent Results (from the past 240 hours)  Urine Culture     Status: Abnormal   Collection Time: 12/13/23  4:07 PM   Specimen: Urine, Random  Result Value Ref Range Status   Specimen Description   Final    URINE, RANDOM Performed at [facility], [address], [place], [place] [address]    Special Requests   Final    NONE  Reflexed from [id number] Performed at [facility], [address], [place], [place] [address]    Culture 20,000 COLONIES/mL YEAST (A)  Final   Report Status 12/15/2023 FINAL  Final  Blood culture (routine x 2)     Status: None (Preliminary result)   Collection Time: 12/19/23  8:11 PM   Specimen: BLOOD  Result Value Ref Range Status   Specimen Description   Final    BLOOD SITE NOT SPECIFIED Performed at [facility], [address], [place], [place] [address]    Special Requests   Final    Blood Culture results may not be optimal due to an inadequate volume of blood received in culture bottles BOTTLES DRAWN AEROBIC AND ANAEROBIC Performed at [facility], [address], [place], [place] [address]    Culture   Final    NO GROWTH 3 DAYS Performed at [facility], [address], [place], [place] [address]    Report Status PENDING  Incomplete  Blood culture (routine x 2)     Status: None (Preliminary result)   Collection Time: 12/19/23  8:11 PM   Specimen: BLOOD  Result Value Ref Range Status   Specimen Description   Final    BLOOD SITE NOT SPECIFIED Performed at [facility], [address], [place], [place] [address]    Special Requests   Final    Blood Culture adequate volume BOTTLES DRAWN AEROBIC AND ANAEROBIC Performed at [facility], [address], [place], [place] [address]    Culture   Final    NO GROWTH 3 DAYS Performed at [facility], [address], [place], [place] [address]    Report Status PENDING  Incomplete  Urine Culture     Status: Abnormal   Collection  Time: 12/19/23  9:02 PM   Specimen: Urine, Catheterized  Result Value Ref Range Status   Specimen Description   Final    URINE, CATHETERIZED Performed at [facility], [address] [place], [place] [address]    Special Requests   Final    NONE Performed at [facility], [address] [place], [place] [address]    Culture >=100,000 COLONIES/mL YEAST (A)  Final   Report Status 12/20/2023 FINAL  Final    Please note: You were cared for by a hospitalist during your hospital stay. Once you are discharged, your primary care physician will handle any further medical issues. Please note that NO REFILLS for any discharge medications will be authorized once you are discharged, as it is imperative that you return to your primary care physician (or establish a relationship with a primary care physician if you do not have one) for your post hospital discharge needs so that they can reassess your need for medications and monitor your lab values.    Time coordinating discharge: 40 minutes  SIGNED:   Ivonne Mustache, MD  [facility] 12/22/2023, 1:47 PM Pager [phone]  If 7PM-7AM, please contact night-coverage [email or web address] Password [email or web address]

## 2023-12-22 NOTE — Progress Notes (Signed)
 Discharge medications delivered to patient at the bedside in a secure bag.

## 2023-12-22 NOTE — TOC Progression Note (Signed)
 Transition of Care [facility]) - Progression Note    Patient Details  Name: John [name] MRN: [id number] Date of Birth: 1946/02/09  [facility] CM/SW Contact  Haru Anspaugh, Nathanel, RN Phone Number: 12/22/2023, 10:46 AM  Clinical Narrative:  PT recc ST SNF;Per permission spouse agreed to fax out-await bed offers prior auth.     Expected Discharge Plan: Skilled Nursing Facility Barriers to Discharge: Continued Medical Work up               Expected Discharge Plan and Services   Discharge Planning Services: CM Consult Post Acute Care Choice: Skilled Nursing Facility Living arrangements for the past 2 months: Single Family Home                                       Social Drivers of Health (SDOH) Interventions SDOH Screenings   Food Insecurity: Patient Unable To Answer (12/20/2023)  Housing: Patient Unable To Answer (12/20/2023)  Transportation Needs: Patient Unable To Answer (12/20/2023)  Utilities: Patient Unable To Answer (12/20/2023)  Alcohol Screen: Low Risk  (01/27/2023)  Depression (PHQ2-9): Low Risk  (01/28/2023)  Financial Resource Strain: Low Risk  (10/03/2023)  Physical Activity: Inactive (10/03/2023)  Social Connections: Unknown (12/20/2023)  Recent Concern: Social Connections - Socially Isolated (12/05/2023)  Stress: No Stress Concern Present (10/03/2023)  Tobacco Use: Low Risk  (12/20/2023)    Readmission Risk Interventions    12/06/2023    4:03 PM 05/11/2023   12:52 PM 04/22/2023   12:12 PM  Readmission Risk Prevention Plan  Medication Screening   Complete  Transportation Screening Complete Complete Complete  PCP or Specialist Appt within 3-5 Days  Complete   HRI or Home Care Consult  Complete   Social Work Consult for Recovery Care Planning/Counseling  Complete   Palliative Care Screening  Not Applicable   Medication Review Oceanographer) Complete Complete   HRI or Home Care Consult Complete    SW Recovery Care/Counseling Consult Complete     Palliative Care Screening Not Applicable    Skilled Nursing Facility Not Applicable

## 2023-12-22 NOTE — Plan of Care (Signed)

## 2023-12-22 NOTE — NC FL2 (Signed)
 [place]  MEDICAID FL2 LEVEL OF CARE FORM     IDENTIFICATION  Patient Name: John [name] Birthdate: 1945-08-15 Sex: male Admission Date (Current Location): 12/19/2023  [facility] and [place] Number:  Producer, television/film/video and Address:  [facility],  [address] [place], [place] [address]      Provider Number: [id number]  Attending Physician Name and Address:  Jillian Buttery, MD  Relative Name and Phone Number:  Traxton Kolenda [phone]    Current Level of Care: Hospital Recommended Level of Care: Skilled Nursing Facility Prior Approval Number:    Date Approved/Denied:   PASRR Number: [id number]  Discharge Plan: SNF    Current Diagnoses: Patient Active Problem List   Diagnosis Date Noted   AMS (altered mental status) 12/13/2023   Altered mental status 12/09/2023   Nausea & vomiting 12/09/2023   Pacemaker 12/09/2023   Sepsis (HCC) 12/04/2023   CAD S/P percutaneous coronary angioplasty 12/04/2023   HFrEF (heart failure with reduced ejection fraction) (HCC) 08/17/2023   DVT, femoral, chronic (HCC) 08/15/2023   Bladder outlet obstruction 08/15/2023   Sepsis due to Escherichia coli (HCC) 08/15/2023   Debility 08/13/2023   Non-ST elevation (NSTEMI) myocardial infarction (HCC) 08/10/2023   Cholelithiasis 08/07/2023   VTE (venous thromboembolism) 08/07/2023   Lactic acidosis 08/07/2023   Sacral pressure ulcer 08/07/2023   Streptococcal bacteremia 08/07/2023   B12 deficiency 08/07/2023   Iron deficiency anemia 08/07/2023   Severe sepsis (HCC) 08/06/2023   Chronic diastolic CHF (congestive heart failure) (HCC) 08/06/2023   Bacteriuria 08/06/2023   Essential hypertension 06/15/2023   Situational depression 06/15/2023   Protein-calorie malnutrition, severe 05/12/2023   Heart block AV complete (HCC) 05/12/2023   Syncope 05/10/2023   Fall at home, initial encounter 05/08/2023   Hypotension 05/08/2023   Abnormal LFTs 05/08/2023   Anemia 05/08/2023    Coping style affecting medical condition 05/02/2023   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) 04/28/2023   Brainstem infarct, acute (HCC) 04/25/2023   Pressure injury of skin 04/25/2023   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   Acute kidney injury 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) 04/17/2023   DKA (diabetic ketoacidosis) (HCC) 04/16/2023   Type 2 diabetes mellitus with hyperglycemia, with long-term current use of insulin  (HCC) 07/26/2022   Bronchitis 03/20/2021   Acute non-recurrent frontal sinusitis 03/20/2021   Viral URI with cough 01/13/2021   Hyperlipidemia 11/21/2020   Dizziness and giddiness 10/20/2020   Localized swelling of both lower extremities 10/20/2020   Tendinitis of right hip flexor 10/20/2020   Aortic atherosclerosis (HCC) 09/10/2020   Insomnia 09/10/2020   Cognitive impairment 08/18/2020   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   Acute urinary retention 06/04/2020   History of COVID-19 12/10/2019   Physical deconditioning 12/10/2019   Fatigue 12/10/2019   Unsteady gait 12/10/2019   Pneumonia due to COVID-19 virus 10/26/2019   Peripheral neuropathy 10/17/2019   Depression, major, single episode, 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/2019   History of breast cancer    GERD (gastroesophageal reflux disease)    BPH (benign prostatic hyperplasia)    CTS (carpal tunnel syndrome)    Diabetic polyneuropathy associated with type 2 diabetes mellitus (HCC) 09/29/2019   Neuropathic pain of foot 09/29/2019   Foley catheter in place 01/03/2019   Peripheral sensory neuropathy 05/17/2018   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   Hypomagnesemia 01/18/2018   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) 12/13/2017   Axillary adenopathy 12/09/2017  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   S/P cataract extraction and insertion of intraocular lens 01/17/2017   Epiretinal membrane  (ERM) of both eyes 01/07/2017   PVD (posterior vitreous detachment), left 01/07/2017   Hyperopia of both eyes with astigmatism 11/09/2016   Class 1 obesity due to excess calories without serious comorbidity with body mass index (BMI) of 31.0 to 31.9 in adult 09/17/2016   Encounter for monitoring tamoxifen  therapy 03/05/2016   Insulin  dependent type 2 diabetes mellitus (HCC) 07/02/2015   Diabetes 1.5, 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   Palpitation 09/16/2014    Orientation RESPIRATION BLADDER Height & Weight     Self  Normal Indwelling catheter Weight: 85.5 kg Height:  6' (182.9 cm)  BEHAVIORAL SYMPTOMS/MOOD NEUROLOGICAL BOWEL NUTRITION STATUS      Incontinent Diet (Soft)  AMBULATORY STATUS COMMUNICATION OF NEEDS Skin   Limited Assist Verbally Normal                       Personal Care Assistance Level of Assistance  Bathing, Feeding, Dressing Bathing Assistance: Limited assistance Feeding assistance: Limited assistance Dressing Assistance: Limited assistance     Functional Limitations Info  Sight, Hearing, Speech Sight Info: Impaired (eyeglasses)   Speech Info: Adequate    SPECIAL CARE FACTORS FREQUENCY  PT (By licensed PT), OT (By licensed OT)     PT Frequency: 5x week OT Frequency: 5x week            Contractures Contractures Info: Not present    Additional Factors Info  Code Status, Allergies Code Status Info: DNR Allergies Info: Ramipril, Adhesive (Tape), Other, Sertraline           Current Medications (12/22/2023):  This is the current hospital active medication list Current Facility-Administered Medications  Medication Dose Route Frequency Provider Last Rate Last Admin   0.9 %  sodium chloride  infusion   Intravenous Continuous Jillian Buttery, MD   Stopped at 12/21/23 2328   acetaminophen  (TYLENOL ) tablet 650 mg  650 mg Oral Q6H PRN Opyd, Timothy S, MD       Or   acetaminophen  (TYLENOL ) suppository 650 mg  650 mg Rectal Q6H PRN Opyd, Timothy  S, MD       atorvastatin  (LIPITOR ) tablet 80 mg  80 mg Oral QHS Opyd, Timothy S, MD   80 mg at 12/21/23 2158   azithromycin  (ZITHROMAX ) tablet 500 mg  500 mg Oral Daily Adhikari, Buttery, MD   500 mg at 12/22/23 0930   cefTRIAXone  (ROCEPHIN ) 2 g in sodium chloride  0.9 % 100 mL IVPB  2 g Intravenous Q24H Jillian Buttery, MD 200 mL/hr at 12/22/23 0700 Infusion Verify at 12/22/23 0700   Chlorhexidine  Gluconate Cloth 2 % PADS 6 each  6 each Topical Daily Opyd, Timothy S, MD   6 each at 12/22/23 0932   clopidogrel  (PLAVIX ) tablet 75 mg  75 mg Oral Q breakfast Opyd, Timothy S, MD   75 mg at 12/22/23 0748   enoxaparin  (LOVENOX ) injection 40 mg  40 mg Subcutaneous Q24H Opyd, Timothy S, MD   40 mg at 12/22/23 0931   escitalopram  (LEXAPRO ) tablet 10 mg  10 mg Oral Daily Opyd, Timothy S, MD   10 mg at 12/22/23 0930   feeding supplement (BOOST / RESOURCE BREEZE) liquid 1 Container  1 Container Oral TID BM Jillian Buttery, MD   1 Container at 12/22/23 0932   Influenza vac split trivalent PF (FLUZONE HIGH-DOSE) injection 0.5 mL  0.5 mL  Intramuscular Tomorrow-1000 Jillian Buttery, MD       insulin  aspart (novoLOG ) injection 0-5 Units  0-5 Units Subcutaneous QHS Opyd, Timothy S, MD       insulin  aspart (novoLOG ) injection 0-6 Units  0-6 Units Subcutaneous TID WC Opyd, Timothy S, MD   1 Units at 12/21/23 1215   labetalol  (NORMODYNE ) injection 10 mg  10 mg Intravenous Q2H PRN Jillian Buttery, MD   10 mg at 12/22/23 0129   losartan  (COZAAR ) tablet 25 mg  25 mg Oral Daily Jillian Buttery, MD   25 mg at 12/22/23 0931   pantoprazole  (PROTONIX ) EC tablet 40 mg  40 mg Oral QHS Opyd, Timothy S, MD   40 mg at 12/21/23 2158   prochlorperazine  (COMPAZINE ) injection 5 mg  5 mg Intravenous Q4H PRN Opyd, Timothy S, MD   5 mg at 12/20/23 9687   senna-docusate (Senokot-S) tablet 1 tablet  1 tablet Oral QHS PRN Opyd, Timothy S, MD       sodium chloride  flush (NS) 0.9 % injection 3 mL  3 mL Intravenous Q12H Opyd, Timothy S, MD   3 mL  at 12/22/23 9047     Discharge Medications: Please see discharge summary for a list of discharge medications.  Relevant Imaging Results:  Relevant Lab Results:   Additional Information SS#[id number] [phone]  Azure Budnick, Nathanel, RN

## 2023-12-22 NOTE — TOC Transition Note (Signed)
 Transition of Care [facility]) - Discharge Note   Patient Details  Name: John [name] MRN: [id number] Date of Birth: January 20, 1946  Transition of Care [facility]) CM/SW Contact:  Bascom Service, RN Phone Number: 12/22/2023, 1:51 PM   Clinical Narrative: Already active w/[facility] HHRN/PT/OT rep Artavia aware. Has own transport home.      Final next level of care: Home w Home Health Services Barriers to Discharge: No Barriers Identified   Patient Goals and CMS Choice Patient states their goals for this hospitalization and ongoing recovery are:: Rehab CMS Medicare.gov Compare Post Acute Care list provided to:: Patient Represenative (must comment) ([name](spouse)) Choice offered to / list presented to : Spouse Roland ownership interest in [facility].provided to:: Spouse    Discharge Placement                       Discharge Plan and Services Additional resources added to the After Visit Summary for     Discharge Planning Services: CM Consult Post Acute Care Choice: Skilled Nursing Facility                    HH Arranged: RN, PT, OT [facility] Agency: [facility] Date [facility] Agency Contacted: 12/22/23 Time HH Agency Contacted: 1351 Representative spoke with at [facility] Agency: [facility]  Social Drivers of Health (SDOH) Interventions SDOH Screenings   Food Insecurity: Patient Unable To Answer (12/20/2023)  Housing: Patient Unable To Answer (12/20/2023)  Transportation Needs: Patient Unable To Answer (12/20/2023)  Utilities: Patient Unable To Answer (12/20/2023)  Alcohol Screen: Low Risk  (01/27/2023)  Depression (PHQ2-9): Low Risk  (01/28/2023)  Financial Resource Strain: Low Risk  (10/03/2023)  Physical Activity: Inactive (10/03/2023)  Social Connections: Unknown (12/20/2023)  Recent Concern: Social Connections - Socially Isolated (12/05/2023)  Stress: No Stress Concern Present (10/03/2023)  Tobacco Use: Low Risk  (12/20/2023)     Readmission Risk Interventions     12/06/2023    4:03 PM 05/11/2023   12:52 PM 04/22/2023   12:12 PM  Readmission Risk Prevention Plan  Medication Screening   Complete  Transportation Screening Complete Complete Complete  PCP or Specialist Appt within 3-5 Days  Complete   HRI or Home Care Consult  Complete   Social Work Consult for Recovery Care Planning/Counseling  Complete   Palliative Care Screening  Not Applicable   Medication Review Oceanographer) Complete Complete   HRI or Home Care Consult Complete    SW Recovery Care/Counseling Consult Complete    Palliative Care Screening Not Applicable    Skilled Nursing Facility Not Applicable

## 2023-12-23 ENCOUNTER — Other Ambulatory Visit (HOSPITAL_BASED_OUTPATIENT_CLINIC_OR_DEPARTMENT_OTHER): Payer: Self-pay

## 2023-12-23 ENCOUNTER — Other Ambulatory Visit: Payer: Self-pay

## 2023-12-23 ENCOUNTER — Ambulatory Visit: Payer: Self-pay

## 2023-12-23 ENCOUNTER — Other Ambulatory Visit: Payer: Self-pay | Admitting: Family Medicine

## 2023-12-23 ENCOUNTER — Telehealth: Payer: Self-pay

## 2023-12-23 MED ORDER — ONDANSETRON 4 MG PO TBDP
4.0000 mg | ORAL_TABLET | Freq: Three times a day (TID) | ORAL | 0 refills | Status: DC | PRN
  Filled 2023-12-23: qty 20, 7d supply, fill #0

## 2023-12-23 NOTE — Telephone Encounter (Signed)
 Summary: Nausea   Patients wife Boby is calling to see if office received request to send in a prescription for Ondansetron  4mg , states patient was discharged from the hospital on 9/11 and having some nausea and a little vomiting. States patient was prescribed this medication before by Dr. Frann and he is out and needs it. Patient has a hospital follow up appointment scheduled for 10/16  [name] [phone]         RN notified [name] that med refill has already been sent up to PCP and is waiting for approval. Pt asked RN to call his nurse and let her know I really need this before the weekend. RN called [employer] on behalf of request.

## 2023-12-23 NOTE — Telephone Encounter (Signed)
 Refill request already sent to PCP. Waiting for response.

## 2023-12-23 NOTE — Transitions of Care (Post Inpatient/ED Visit) (Signed)
 12/23/2023  Name: John [name] MRN: [id number] DOB: [birth year]  Today's TOC FU Call Status: Today's TOC FU Call Status:: Successful TOC FU Call Completed TOC FU Call Complete Date: 12/23/23 Patient's Name and Date of Birth confirmed.  Transition Care Management Follow-up Telephone Call Date of Discharge: 12/22/23 Discharge Facility: [name]) Type of Discharge: Inpatient Admission Primary Inpatient Discharge Diagnosis:: Sepsis, Pneumonia How have you been since you were released from the hospital?: Same Any questions or concerns?: Yes Patient Questions/Concerns:: Trying to get nausea medication before the weekend Patient Questions/Concerns Addressed: Notified Provider of Patient Questions/Concerns  Items Reviewed: Did you receive and understand the discharge instructions provided?: Yes Medications obtained,verified, and reconciled?: Yes (Medications Reviewed) Any new allergies since your discharge?: No Dietary orders reviewed?: Yes Type of Diet Ordered:: Heart Healthy Do you have support at home?: Yes People in Home [RPT]: spouse Name of Support/Comfort Primary Source: John [name]  Medications Reviewed Today: Medications Reviewed Today     Reviewed by John Reusing, RN (Case Manager) on 12/23/23 at 1134  Med List Status: <None>   Medication Order Taking? Sig Documenting Provider Last Dose Status Informant    Discontinued 06/30/23 1746   amoxicillin -clavulanate (AUGMENTIN ) 875-125 MG tablet [id number]  Take 1 tablet by mouth every 12 (twelve) hours for 3 days. John Buttery, MD  Active     Discontinued 10/11/23 1505 (Patient Preference)   Ascorbic Acid  (VITAMIN C) 1000 MG tablet [id number] No Take 1,000 mg by mouth 2 (two) times a week. [provider] Past Week Active Spouse/Significant Other, Pharmacy Records  atorvastatin  (LIPITOR ) 80 MG tablet [id number] No Take 1 tablet (80 mg total) by mouth at bedtime. John Mabel Mt, DO 12/18/2023 Active  Spouse/Significant Other, Pharmacy Records  clopidogrel  (PLAVIX ) 75 MG tablet [id number] No Take 1 tablet (75 mg total) by mouth daily with breakfast. John Mabel Mt, DO 12/19/2023 Active Spouse/Significant Other, Pharmacy Records  clotrimazole -betamethasone  (LOTRISONE ) cream [id number] No Apply 1 Application topically daily.  Patient taking differently: Apply 1 Application topically daily as needed (rash).   John Mabel Mt, DO Past Week Active Spouse/Significant Other, Pharmacy Records  cyanocobalamin  1000 MCG tablet [id number] No Take 1 tablet (1,000 mcg total) by mouth daily.  Patient taking differently: Take 1,000 mcg by mouth 3 (three) times a week.   John Mabel Mt, DO Past Week Active Spouse/Significant Other, Pharmacy Records  escitalopram  (LEXAPRO ) 10 MG tablet [id number] No Take 1 tablet (10 mg total) by mouth daily. John Mabel Mt, DO 12/19/2023 Active Spouse/Significant Other, Pharmacy Records  ferrous sulfate  (FEROSUL) 325 (65 FE) MG tablet [id number] No Take 1 tablet (325 mg total) by mouth daily with breakfast. John Mabel Mt, DO 12/18/2023 Active Spouse/Significant Other, Pharmacy Records  finasteride  (PROSCAR ) 5 MG tablet [id number] No Take 1 tablet (5 mg total) by mouth daily. *Need appointment for future refills.John John Mabel Mt, DO 12/19/2023 Active Spouse/Significant Other, Pharmacy Records  hyoscyamine  (LEVSIN  SL) 0.125 MG SL tablet [id number] No Place 1 tablet (0.125 mg total) under the tongue every 4 (four) hours as needed for bladder spasms. John Toribio PARAS, PA-C Unknown Active Spouse/Significant Other, Pharmacy Records  loperamide  (IMODIUM  A-D) 2 MG tablet [id number] No Take 2 mg by mouth as needed for diarrhea or loose stools. [provider] 12/18/2023 Active Spouse/Significant Other, Pharmacy Records  losartan  (COZAAR ) 25 MG tablet [id number]  Take 1 tablet (25 mg total) by mouth daily. John Buttery, MD  Active   Magnesium   Citrate 200 MG TABS [id number] No Take 200 mg  by mouth 2 (two) times daily before a meal.  Patient not taking: Reported on 12/20/2023   John [name], MD Not Taking Active Spouse/Significant Other, Pharmacy Records  metFORMIN  (GLUCOPHAGE ) 500 MG tablet [id number]  Take 1 tablet (500 mg total) by mouth 2 (two) times daily with a meal. John Buttery, MD  Active   Multiple Vitamin (MULTIVITAMIN) tablet [id number] No Take 1 tablet by mouth 2 (two) times a week. [provider] Past Week Active Spouse/Significant Other, Pharmacy Records  pantoprazole  (PROTONIX ) 40 MG tablet [id number] No Take 1 tablet (40 mg total) by mouth at bedtime. John Mabel Mt, DO 12/17/2023 Active Spouse/Significant Other, Pharmacy Records  senna-docusate (SENOKOT-S) 8.6-50 MG tablet [id number] No Take 1 tablet by mouth 2 (two) times daily.  Patient taking differently: Take 1 tablet by mouth daily as needed for mild constipation or moderate constipation.   John [name], MD Unknown Active Spouse/Significant Other, Pharmacy Records            Home Care and Equipment/Supplies: Were Home Health Services Ordered?: Yes Name of Home Health Agency:: [facility] Has Agency set up a time to come to your home?: No EMR reviewed for Home Health Orders: Orders present/patient has not received call (refer to CM for follow-up) Any new equipment or medical supplies ordered?: No  Functional Questionnaire: Do you need assistance with bathing/showering or dressing?: Yes (The patient has mild cognitive impairment and the spouse is assisting) Do you need assistance with meal preparation?: Yes (The patient has mild cognitive impairment and the spouse is assisting) Do you need assistance with eating?: No Do you have difficulty maintaining continence: Yes (The patient has mild cognitive impairment and the spouse is assisting) Do you need assistance with getting out of bed/getting out of a chair/moving?: Yes (The patient  has mild cognitive impairment and the spouse is assisting) Do you have difficulty managing or taking your medications?: Yes (The patient has mild cognitive impairment and the spouse is assisting)  Follow up appointments reviewed: PCP Follow-up appointment confirmed?: Yes Date of PCP follow-up appointment?: 12/27/23 Follow-up Provider: Mabel [name] Follow-up appointment confirmed?: No Reason Specialist Follow-Up Not Confirmed: Patient has Specialist Provider Number and will Call for Appointment Do you need transportation to your follow-up appointment?: No Do you understand care options if your condition(s) worsen?: Yes-patient verbalized understanding  SDOH Interventions Today    Flowsheet Row Most Recent Value  SDOH Interventions   Food Insecurity Interventions Intervention Not Indicated  Housing Interventions Intervention Not Indicated  Transportation Interventions Intervention Not Indicated  Utilities Interventions Intervention Not Indicated   Discussed and offered 30 day TOC program.The patient has been provided with contact information for the care management team and has been advised to call with any health -related questions or concerns.  The patient verbalized understanding with current plan of care.  The patient is directed to their insurance card regarding availability of benefits coverage.    Medford Balboa, BSN, RN [place]  VBCI - [employer] [facility] RN Care Manager [phone]

## 2023-12-24 DIAGNOSIS — I2510 Atherosclerotic heart disease of native coronary artery without angina pectoris: Secondary | ICD-10-CM | POA: Diagnosis not present

## 2023-12-24 DIAGNOSIS — A409 Streptococcal sepsis, unspecified: Secondary | ICD-10-CM | POA: Diagnosis not present

## 2023-12-24 DIAGNOSIS — Z556 Problems related to health literacy: Secondary | ICD-10-CM | POA: Diagnosis not present

## 2023-12-24 DIAGNOSIS — Z853 Personal history of malignant neoplasm of breast: Secondary | ICD-10-CM | POA: Diagnosis not present

## 2023-12-24 DIAGNOSIS — N401 Enlarged prostate with lower urinary tract symptoms: Secondary | ICD-10-CM | POA: Diagnosis not present

## 2023-12-24 DIAGNOSIS — I951 Orthostatic hypotension: Secondary | ICD-10-CM | POA: Diagnosis not present

## 2023-12-24 DIAGNOSIS — G5600 Carpal tunnel syndrome, unspecified upper limb: Secondary | ICD-10-CM | POA: Diagnosis not present

## 2023-12-24 DIAGNOSIS — N320 Bladder-neck obstruction: Secondary | ICD-10-CM | POA: Diagnosis not present

## 2023-12-24 DIAGNOSIS — I252 Old myocardial infarction: Secondary | ICD-10-CM | POA: Diagnosis not present

## 2023-12-24 DIAGNOSIS — Z9861 Coronary angioplasty status: Secondary | ICD-10-CM | POA: Diagnosis not present

## 2023-12-24 DIAGNOSIS — Z950 Presence of cardiac pacemaker: Secondary | ICD-10-CM | POA: Diagnosis not present

## 2023-12-24 DIAGNOSIS — F325 Major depressive disorder, single episode, in full remission: Secondary | ICD-10-CM | POA: Diagnosis not present

## 2023-12-24 DIAGNOSIS — I110 Hypertensive heart disease with heart failure: Secondary | ICD-10-CM | POA: Diagnosis not present

## 2023-12-24 DIAGNOSIS — D509 Iron deficiency anemia, unspecified: Secondary | ICD-10-CM | POA: Diagnosis not present

## 2023-12-24 DIAGNOSIS — K219 Gastro-esophageal reflux disease without esophagitis: Secondary | ICD-10-CM | POA: Diagnosis not present

## 2023-12-24 DIAGNOSIS — I69354 Hemiplegia and hemiparesis following cerebral infarction affecting left non-dominant side: Secondary | ICD-10-CM | POA: Diagnosis not present

## 2023-12-24 DIAGNOSIS — Z86711 Personal history of pulmonary embolism: Secondary | ICD-10-CM | POA: Diagnosis not present

## 2023-12-24 DIAGNOSIS — I429 Cardiomyopathy, unspecified: Secondary | ICD-10-CM | POA: Diagnosis not present

## 2023-12-24 DIAGNOSIS — I5020 Unspecified systolic (congestive) heart failure: Secondary | ICD-10-CM | POA: Diagnosis not present

## 2023-12-24 DIAGNOSIS — Z466 Encounter for fitting and adjustment of urinary device: Secondary | ICD-10-CM | POA: Diagnosis not present

## 2023-12-24 DIAGNOSIS — Z794 Long term (current) use of insulin: Secondary | ICD-10-CM | POA: Diagnosis not present

## 2023-12-24 DIAGNOSIS — E1140 Type 2 diabetes mellitus with diabetic neuropathy, unspecified: Secondary | ICD-10-CM | POA: Diagnosis not present

## 2023-12-24 DIAGNOSIS — R338 Other retention of urine: Secondary | ICD-10-CM | POA: Diagnosis not present

## 2023-12-24 DIAGNOSIS — N390 Urinary tract infection, site not specified: Secondary | ICD-10-CM | POA: Diagnosis not present

## 2023-12-24 DIAGNOSIS — E1165 Type 2 diabetes mellitus with hyperglycemia: Secondary | ICD-10-CM | POA: Diagnosis not present

## 2023-12-24 LAB — CULTURE, BLOOD (ROUTINE X 2)
Culture: NO GROWTH
Culture: NO GROWTH
Special Requests: ADEQUATE

## 2023-12-25 DIAGNOSIS — Z794 Long term (current) use of insulin: Secondary | ICD-10-CM | POA: Diagnosis not present

## 2023-12-25 DIAGNOSIS — R338 Other retention of urine: Secondary | ICD-10-CM | POA: Diagnosis not present

## 2023-12-25 DIAGNOSIS — N320 Bladder-neck obstruction: Secondary | ICD-10-CM | POA: Diagnosis not present

## 2023-12-25 DIAGNOSIS — Z556 Problems related to health literacy: Secondary | ICD-10-CM | POA: Diagnosis not present

## 2023-12-25 DIAGNOSIS — Z853 Personal history of malignant neoplasm of breast: Secondary | ICD-10-CM | POA: Diagnosis not present

## 2023-12-25 DIAGNOSIS — N401 Enlarged prostate with lower urinary tract symptoms: Secondary | ICD-10-CM | POA: Diagnosis not present

## 2023-12-25 DIAGNOSIS — N390 Urinary tract infection, site not specified: Secondary | ICD-10-CM | POA: Diagnosis not present

## 2023-12-25 DIAGNOSIS — I429 Cardiomyopathy, unspecified: Secondary | ICD-10-CM | POA: Diagnosis not present

## 2023-12-25 DIAGNOSIS — Z950 Presence of cardiac pacemaker: Secondary | ICD-10-CM | POA: Diagnosis not present

## 2023-12-25 DIAGNOSIS — I2510 Atherosclerotic heart disease of native coronary artery without angina pectoris: Secondary | ICD-10-CM | POA: Diagnosis not present

## 2023-12-25 DIAGNOSIS — Z9861 Coronary angioplasty status: Secondary | ICD-10-CM | POA: Diagnosis not present

## 2023-12-25 DIAGNOSIS — D509 Iron deficiency anemia, unspecified: Secondary | ICD-10-CM | POA: Diagnosis not present

## 2023-12-25 DIAGNOSIS — K219 Gastro-esophageal reflux disease without esophagitis: Secondary | ICD-10-CM | POA: Diagnosis not present

## 2023-12-25 DIAGNOSIS — F325 Major depressive disorder, single episode, in full remission: Secondary | ICD-10-CM | POA: Diagnosis not present

## 2023-12-25 DIAGNOSIS — I69354 Hemiplegia and hemiparesis following cerebral infarction affecting left non-dominant side: Secondary | ICD-10-CM | POA: Diagnosis not present

## 2023-12-25 DIAGNOSIS — A409 Streptococcal sepsis, unspecified: Secondary | ICD-10-CM | POA: Diagnosis not present

## 2023-12-25 DIAGNOSIS — I252 Old myocardial infarction: Secondary | ICD-10-CM | POA: Diagnosis not present

## 2023-12-25 DIAGNOSIS — I5020 Unspecified systolic (congestive) heart failure: Secondary | ICD-10-CM | POA: Diagnosis not present

## 2023-12-25 DIAGNOSIS — E1165 Type 2 diabetes mellitus with hyperglycemia: Secondary | ICD-10-CM | POA: Diagnosis not present

## 2023-12-25 DIAGNOSIS — I110 Hypertensive heart disease with heart failure: Secondary | ICD-10-CM | POA: Diagnosis not present

## 2023-12-25 DIAGNOSIS — Z466 Encounter for fitting and adjustment of urinary device: Secondary | ICD-10-CM | POA: Diagnosis not present

## 2023-12-25 DIAGNOSIS — I951 Orthostatic hypotension: Secondary | ICD-10-CM | POA: Diagnosis not present

## 2023-12-25 DIAGNOSIS — G5600 Carpal tunnel syndrome, unspecified upper limb: Secondary | ICD-10-CM | POA: Diagnosis not present

## 2023-12-25 DIAGNOSIS — Z86711 Personal history of pulmonary embolism: Secondary | ICD-10-CM | POA: Diagnosis not present

## 2023-12-25 DIAGNOSIS — E1140 Type 2 diabetes mellitus with diabetic neuropathy, unspecified: Secondary | ICD-10-CM | POA: Diagnosis not present

## 2023-12-26 ENCOUNTER — Observation Stay (HOSPITAL_COMMUNITY)
Admission: EM | Admit: 2023-12-26 | Discharge: 2024-01-02 | Disposition: A | Discharge status: Home / Self Care | Attending: Family Medicine | Admitting: Family Medicine

## 2023-12-26 ENCOUNTER — Emergency Department (HOSPITAL_COMMUNITY)

## 2023-12-26 ENCOUNTER — Encounter (HOSPITAL_COMMUNITY): Payer: Self-pay | Admitting: Family Medicine

## 2023-12-26 DIAGNOSIS — R1084 Generalized abdominal pain: Principal | ICD-10-CM | POA: Insufficient documentation

## 2023-12-26 DIAGNOSIS — I5020 Unspecified systolic (congestive) heart failure: Secondary | ICD-10-CM | POA: Diagnosis present

## 2023-12-26 DIAGNOSIS — Z794 Long term (current) use of insulin: Secondary | ICD-10-CM | POA: Diagnosis not present

## 2023-12-26 DIAGNOSIS — Z86718 Personal history of other venous thrombosis and embolism: Secondary | ICD-10-CM | POA: Diagnosis not present

## 2023-12-26 DIAGNOSIS — E663 Overweight: Secondary | ICD-10-CM | POA: Diagnosis not present

## 2023-12-26 DIAGNOSIS — R339 Retention of urine, unspecified: Secondary | ICD-10-CM | POA: Diagnosis present

## 2023-12-26 DIAGNOSIS — I10 Essential (primary) hypertension: Secondary | ICD-10-CM | POA: Diagnosis present

## 2023-12-26 DIAGNOSIS — F325 Major depressive disorder, single episode, in full remission: Secondary | ICD-10-CM | POA: Diagnosis present

## 2023-12-26 DIAGNOSIS — E871 Hypo-osmolality and hyponatremia: Secondary | ICD-10-CM | POA: Diagnosis not present

## 2023-12-26 DIAGNOSIS — Z6824 Body mass index (BMI) 24.0-24.9, adult: Secondary | ICD-10-CM | POA: Diagnosis not present

## 2023-12-26 DIAGNOSIS — R262 Difficulty in walking, not elsewhere classified: Secondary | ICD-10-CM | POA: Diagnosis not present

## 2023-12-26 DIAGNOSIS — I2510 Atherosclerotic heart disease of native coronary artery without angina pectoris: Secondary | ICD-10-CM | POA: Insufficient documentation

## 2023-12-26 DIAGNOSIS — I110 Hypertensive heart disease with heart failure: Secondary | ICD-10-CM | POA: Insufficient documentation

## 2023-12-26 DIAGNOSIS — B3749 Other urogenital candidiasis: Secondary | ICD-10-CM | POA: Diagnosis not present

## 2023-12-26 DIAGNOSIS — Z79899 Other long term (current) drug therapy: Secondary | ICD-10-CM | POA: Diagnosis not present

## 2023-12-26 DIAGNOSIS — Z7901 Long term (current) use of anticoagulants: Secondary | ICD-10-CM | POA: Insufficient documentation

## 2023-12-26 DIAGNOSIS — R112 Nausea with vomiting, unspecified: Principal | ICD-10-CM | POA: Diagnosis present

## 2023-12-26 DIAGNOSIS — C50911 Malignant neoplasm of unspecified site of right female breast: Secondary | ICD-10-CM | POA: Diagnosis present

## 2023-12-26 DIAGNOSIS — R109 Unspecified abdominal pain: Secondary | ICD-10-CM | POA: Diagnosis not present

## 2023-12-26 DIAGNOSIS — I5022 Chronic systolic (congestive) heart failure: Secondary | ICD-10-CM | POA: Diagnosis not present

## 2023-12-26 DIAGNOSIS — F32A Depression, unspecified: Secondary | ICD-10-CM | POA: Insufficient documentation

## 2023-12-26 DIAGNOSIS — G3184 Mild cognitive impairment, so stated: Secondary | ICD-10-CM | POA: Insufficient documentation

## 2023-12-26 DIAGNOSIS — E876 Hypokalemia: Secondary | ICD-10-CM | POA: Insufficient documentation

## 2023-12-26 DIAGNOSIS — E1165 Type 2 diabetes mellitus with hyperglycemia: Secondary | ICD-10-CM | POA: Diagnosis not present

## 2023-12-26 DIAGNOSIS — Z9861 Coronary angioplasty status: Secondary | ICD-10-CM | POA: Insufficient documentation

## 2023-12-26 DIAGNOSIS — R0602 Shortness of breath: Secondary | ICD-10-CM | POA: Diagnosis not present

## 2023-12-26 DIAGNOSIS — R509 Fever, unspecified: Secondary | ICD-10-CM | POA: Diagnosis not present

## 2023-12-26 DIAGNOSIS — R000 Tachycardia, unspecified: Secondary | ICD-10-CM | POA: Diagnosis not present

## 2023-12-26 DIAGNOSIS — R4189 Other symptoms and signs involving cognitive functions and awareness: Secondary | ICD-10-CM | POA: Diagnosis present

## 2023-12-26 DIAGNOSIS — C50921 Malignant neoplasm of unspecified site of right male breast: Secondary | ICD-10-CM | POA: Insufficient documentation

## 2023-12-26 DIAGNOSIS — K5900 Constipation, unspecified: Secondary | ICD-10-CM | POA: Diagnosis present

## 2023-12-26 LAB — CBC WITH DIFFERENTIAL/PLATELET
Abs Immature Granulocytes: 0.09 K/uL — ABNORMAL HIGH (ref 0.00–0.07)
Basophils Absolute: 0.1 K/uL (ref 0.0–0.1)
Basophils Relative: 0 %
Eosinophils Absolute: 0.1 K/uL (ref 0.0–0.5)
Eosinophils Relative: 1 %
HCT: 39.1 % (ref 39.0–52.0)
Hemoglobin: 12.6 g/dL — ABNORMAL LOW (ref 13.0–17.0)
Immature Granulocytes: 1 %
Lymphocytes Relative: 8 %
Lymphs Abs: 0.9 K/uL (ref 0.7–4.0)
MCH: 27.9 pg (ref 26.0–34.0)
MCHC: 32.2 g/dL (ref 30.0–36.0)
MCV: 86.7 fL (ref 80.0–100.0)
Monocytes Absolute: 0.8 K/uL (ref 0.1–1.0)
Monocytes Relative: 7 %
Neutro Abs: 9.7 K/uL — ABNORMAL HIGH (ref 1.7–7.7)
Neutrophils Relative %: 83 %
Platelets: 226 K/uL (ref 150–400)
RBC: 4.51 MIL/uL (ref 4.22–5.81)
RDW: 18.9 % — ABNORMAL HIGH (ref 11.5–15.5)
WBC: 11.7 K/uL — ABNORMAL HIGH (ref 4.0–10.5)
nRBC: 0 % (ref 0.0–0.2)

## 2023-12-26 LAB — I-STAT CHEM 8, ED
BUN: 8 mg/dL (ref 8–23)
Calcium, Ion: 1.18 mmol/L (ref 1.15–1.40)
Chloride: 95 mmol/L — ABNORMAL LOW (ref 98–111)
Creatinine, Ser: 0.7 mg/dL (ref 0.61–1.24)
Glucose, Bld: 300 mg/dL — ABNORMAL HIGH (ref 70–99)
HCT: 39 % (ref 39.0–52.0)
Hemoglobin: 13.3 g/dL (ref 13.0–17.0)
Potassium: 3.6 mmol/L (ref 3.5–5.1)
Sodium: 134 mmol/L — ABNORMAL LOW (ref 135–145)
TCO2: 26 mmol/L (ref 22–32)

## 2023-12-26 LAB — URINALYSIS, W/ REFLEX TO CULTURE (INFECTION SUSPECTED)
Bilirubin Urine: NEGATIVE
Glucose, UA: >=500 mg/dL — AB
Ketones, ur: 5 mg/dL — AB
Nitrite: NEGATIVE
Protein, ur: 100 mg/dL — AB
RBC / HPF: >50 RBC/hpf (ref 0–5)
Specific Gravity, Urine: 1.014 (ref 1.005–1.030)
WBC, UA: >50 WBC/hpf (ref 0–5)
pH: 6 (ref 5.0–8.0)

## 2023-12-26 LAB — COMPREHENSIVE METABOLIC PANEL WITH GFR
ALT: 21 U/L (ref 0–44)
AST: 20 U/L (ref 15–41)
Albumin: 3.9 g/dL (ref 3.5–5.0)
Alkaline Phosphatase: 76 U/L (ref 38–126)
Anion gap: 14 (ref 5–15)
BUN: 8 mg/dL (ref 8–23)
CO2: 25 mmol/L (ref 22–32)
Calcium: 9.8 mg/dL (ref 8.9–10.3)
Chloride: 94 mmol/L — ABNORMAL LOW (ref 98–111)
Creatinine, Ser: 0.66 mg/dL (ref 0.61–1.24)
GFR, Estimated: >60 mL/min (ref >60)
Glucose, Bld: 291 mg/dL — ABNORMAL HIGH (ref 70–99)
Potassium: 3.5 mmol/L (ref 3.5–5.1)
Sodium: 133 mmol/L — ABNORMAL LOW (ref 135–145)
Total Bilirubin: 1.9 mg/dL — ABNORMAL HIGH (ref 0.0–1.2)
Total Protein: 6.4 g/dL — ABNORMAL LOW (ref 6.5–8.1)

## 2023-12-26 LAB — LIPASE, BLOOD: Lipase: 19 U/L (ref 11–51)

## 2023-12-26 LAB — GLUCOSE, CAPILLARY
Glucose-Capillary: 238 mg/dL — ABNORMAL HIGH (ref 70–99)
Glucose-Capillary: 199 mg/dL — ABNORMAL HIGH (ref 70–99)

## 2023-12-26 LAB — I-STAT CG4 LACTIC ACID, ED: Lactic Acid, Venous: 2.1 mmol/L (ref 0.5–1.9)

## 2023-12-26 MED ORDER — IOHEXOL 300 MG/ML  SOLN
100.0000 mL | Freq: Once | INTRAMUSCULAR | Status: AC | PRN
  Administered 2023-12-26: 100 mL via INTRAVENOUS

## 2023-12-26 MED ORDER — LABETALOL HCL 5 MG/ML IV SOLN
10.0000 mg | INTRAVENOUS | Status: DC | PRN
  Administered 2023-12-30: 10 mg via INTRAVENOUS
  Filled 2023-12-26: qty 4

## 2023-12-26 MED ORDER — FLUCONAZOLE 150 MG PO TABS
150.0000 mg | ORAL_TABLET | Freq: Once | ORAL | Status: AC
  Administered 2023-12-26: 150 mg via ORAL
  Filled 2023-12-26: qty 1

## 2023-12-26 MED ORDER — SODIUM CHLORIDE 0.9 % IV BOLUS
1000.0000 mL | Freq: Once | INTRAVENOUS | Status: AC
  Administered 2023-12-26: 1000 mL via INTRAVENOUS

## 2023-12-26 MED ORDER — ACETAMINOPHEN 650 MG RE SUPP: 650.0000 mg | Freq: Four times a day (QID) | RECTAL | Status: DC | PRN

## 2023-12-26 MED ORDER — ONDANSETRON HCL 4 MG/2ML IJ SOLN
4.0000 mg | Freq: Once | INTRAMUSCULAR | Status: AC
Start: 2023-12-26 — End: 2023-12-26
  Administered 2023-12-26: 4 mg via INTRAVENOUS
  Filled 2023-12-26: qty 2

## 2023-12-26 MED ORDER — INSULIN ASPART 100 UNIT/ML IJ SOLN
0.0000 [IU] | INTRAMUSCULAR | Status: DC
  Administered 2023-12-26: 1 [IU] via SUBCUTANEOUS
  Administered 2023-12-26 – 2023-12-27 (×2): 2 [IU] via SUBCUTANEOUS
  Administered 2023-12-27 (×2): 1 [IU] via SUBCUTANEOUS
  Filled 2023-12-26: qty 0.06

## 2023-12-26 MED ORDER — BISACODYL 5 MG PO TBEC: 5.0000 mg | DELAYED_RELEASE_TABLET | Freq: Every day | ORAL | Status: DC | PRN

## 2023-12-26 MED ORDER — MORPHINE SULFATE (PF) 4 MG/ML IV SOLN
4.0000 mg | Freq: Once | INTRAVENOUS | Status: AC
  Administered 2023-12-26: 4 mg via INTRAVENOUS
  Filled 2023-12-26: qty 1

## 2023-12-26 MED ORDER — CLOPIDOGREL BISULFATE 75 MG PO TABS
75.0000 mg | ORAL_TABLET | Freq: Every day | ORAL | Status: DC
  Administered 2023-12-27 – 2024-01-02 (×7): 75 mg via ORAL
  Filled 2023-12-26 (×7): qty 1

## 2023-12-26 MED ORDER — PROCHLORPERAZINE EDISYLATE 10 MG/2ML IJ SOLN
5.0000 mg | INTRAMUSCULAR | Status: DC | PRN
  Administered 2023-12-28: 5 mg via INTRAVENOUS
  Filled 2023-12-26: qty 2

## 2023-12-26 MED ORDER — DOCUSATE SODIUM 100 MG PO CAPS
100.0000 mg | ORAL_CAPSULE | Freq: Two times a day (BID) | ORAL | Status: DC
  Administered 2023-12-26: 100 mg via ORAL
  Filled 2023-12-26 (×2): qty 1

## 2023-12-26 MED ORDER — OXYCODONE HCL 5 MG PO TABS
5.0000 mg | ORAL_TABLET | ORAL | Status: DC | PRN
  Administered 2023-12-28 – 2024-01-01 (×4): 5 mg via ORAL
  Filled 2023-12-26 (×4): qty 1

## 2023-12-26 MED ORDER — ENOXAPARIN SODIUM 40 MG/0.4ML IJ SOSY
40.0000 mg | PREFILLED_SYRINGE | INTRAMUSCULAR | Status: DC
  Administered 2023-12-26 – 2023-12-29 (×4): 40 mg via SUBCUTANEOUS
  Filled 2023-12-26 (×4): qty 0.4

## 2023-12-26 MED ORDER — FLEET ENEMA RE ENEM
1.0000 | ENEMA | Freq: Once | RECTAL | Status: AC
  Administered 2023-12-31: 1 via RECTAL
  Filled 2023-12-26: qty 1

## 2023-12-26 MED ORDER — SODIUM CHLORIDE 0.9% FLUSH
3.0000 mL | Freq: Two times a day (BID) | INTRAVENOUS | Status: DC
Start: 2023-12-26 — End: 2024-01-02
  Administered 2023-12-26 – 2024-01-02 (×13): 3 mL via INTRAVENOUS

## 2023-12-26 MED ORDER — ESCITALOPRAM OXALATE 10 MG PO TABS
10.0000 mg | ORAL_TABLET | Freq: Every day | ORAL | Status: DC
  Administered 2023-12-28 – 2024-01-02 (×6): 10 mg via ORAL
  Filled 2023-12-26 (×7): qty 1

## 2023-12-26 MED ORDER — POLYETHYLENE GLYCOL 3350 17 G PO PACK: 17.0000 g | PACK | Freq: Every day | ORAL | Status: DC | PRN

## 2023-12-26 MED ORDER — ATORVASTATIN CALCIUM 40 MG PO TABS
80.0000 mg | ORAL_TABLET | Freq: Every day | ORAL | Status: DC
  Administered 2023-12-26 – 2024-01-01 (×6): 80 mg via ORAL
  Filled 2023-12-26 (×4): qty 2
  Filled 2023-12-26: qty 8
  Filled 2023-12-26 (×2): qty 2

## 2023-12-26 MED ORDER — ACETAMINOPHEN 325 MG PO TABS
650.0000 mg | ORAL_TABLET | Freq: Four times a day (QID) | ORAL | Status: DC | PRN
  Administered 2023-12-29: 650 mg via ORAL
  Filled 2023-12-26: qty 2

## 2023-12-26 MED ORDER — FENTANYL CITRATE PF 50 MCG/ML IJ SOSY
25.0000 ug | PREFILLED_SYRINGE | INTRAMUSCULAR | Status: DC | PRN
  Administered 2023-12-30: 50 ug via INTRAVENOUS
  Filled 2023-12-26: qty 1

## 2023-12-26 MED ORDER — PANTOPRAZOLE SODIUM 40 MG PO TBEC
40.0000 mg | DELAYED_RELEASE_TABLET | Freq: Every day | ORAL | Status: DC
  Administered 2023-12-27 – 2024-01-01 (×5): 40 mg via ORAL
  Filled 2023-12-26 (×6): qty 1

## 2023-12-26 NOTE — Progress Notes (Signed)
 RNCM notified and will assist.

## 2023-12-26 NOTE — ED Provider Notes (Signed)
 [place] EMERGENCY DEPARTMENT AT [facility] Provider Note   CSN: [id number] Arrival date & time: 12/26/23  1010     Patient presents with: Abdominal Pain   John [name] is a 78 y.o. male.   78 yo M with medical compliance abdominal pain.  This along the center of the abdomen.  Nothing seems to make better or worse.  Has had nausea and vomiting with it.  Denies diarrhea.  Has a Foley catheter in place.   Abdominal Pain      Prior to Admission medications   Medication Sig Start Date End Date Taking? Authorizing Provider  amoxicillin -clavulanate (AUGMENTIN ) 875-125 MG tablet Take 1 tablet by mouth every 12 (twelve) hours for 3 days. 12/23/23 12/26/23  Jillian Buttery, MD  Ascorbic Acid  (VITAMIN C) 1000 MG tablet Take 1,000 mg by mouth 2 (two) times a week.    [provider]  atorvastatin  (LIPITOR ) 80 MG tablet Take 1 tablet (80 mg total) by mouth at bedtime. 09/14/23   [name], DO  clopidogrel  (PLAVIX ) 75 MG tablet Take 1 tablet (75 mg total) by mouth daily with breakfast. 10/11/23   [name], DO  clotrimazole -betamethasone  (LOTRISONE ) cream Apply 1 Application topically daily. Patient taking differently: Apply 1 Application topically daily as needed (rash). 07/11/23   [name], DO  cyanocobalamin  1000 MCG tablet Take 1 tablet (1,000 mcg total) by mouth daily. Patient taking differently: Take 1,000 mcg by mouth 3 (three) times a week. 09/14/23   [name], DO  escitalopram  (LEXAPRO ) 10 MG tablet Take 1 tablet (10 mg total) by mouth daily. 10/03/23   [name], DO  ferrous sulfate  (FEROSUL) 325 (65 FE) MG tablet Take 1 tablet (325 mg total) by mouth daily with breakfast. 11/28/23   Frann, Mabel Mt, DO  finasteride  (PROSCAR ) 5 MG tablet Take 1 tablet (5 mg total) by mouth daily. *Need appointment for future refills.* 11/28/23   Wendling, Mabel Mt, DO  hyoscyamine  (LEVSIN  SL) 0.125 MG  SL tablet Place 1 tablet (0.125 mg total) under the tongue every 4 (four) hours as needed for bladder spasms. 08/17/23   Angiulli, Toribio PARAS, PA-C  loperamide  (IMODIUM  A-D) 2 MG tablet Take 2 mg by mouth as needed for diarrhea or loose stools.    [provider]  losartan  (COZAAR ) 25 MG tablet Take 1 tablet (25 mg total) by mouth daily. 12/23/23   Jillian Buttery, MD  Magnesium  Citrate 200 MG TABS Take 200 mg by mouth 2 (two) times daily before a meal. Patient not taking: Reported on 12/20/2023 12/15/23   Lue [name], MD  metFORMIN  (GLUCOPHAGE ) 500 MG tablet Take 1 tablet (500 mg total) by mouth 2 (two) times daily with a meal. 12/22/23   Jillian Buttery, MD  Multiple Vitamin (MULTIVITAMIN) tablet Take 1 tablet by mouth 2 (two) times a week.    [provider]  ondansetron  (ZOFRAN -ODT) 4 MG disintegrating tablet Take 1 tablet (4 mg total) by mouth every 8 (eight) hours as needed for nausea or vomiting. 12/23/23   Frann, Mabel Mt, DO  pantoprazole  (PROTONIX ) 40 MG tablet Take 1 tablet (40 mg total) by mouth at bedtime. 12/19/23   [name], DO  senna-docusate (SENOKOT-S) 8.6-50 MG tablet Take 1 tablet by mouth 2 (two) times daily. Patient taking differently: Take 1 tablet by mouth daily as needed for mild constipation or moderate constipation. 12/15/23   Lue [name], MD  amLODipine  (NORVASC ) 2.5 MG tablet Take  1 tablet (2.5 mg total) by mouth daily. 06/15/23 06/30/23  [name], DO  apixaban  (ELIQUIS ) 5 MG TABS tablet Take 1 tablet (5 mg total) by mouth 2 (two) times daily. 10/11/23 10/11/23  [name], DO    Allergies: Ramipril, Adhesive [tape], Other, and Sertraline    Review of Systems  Gastrointestinal:  Positive for abdominal pain.    Updated Vital Signs BP (!) 177/87 (BP Location: Right Arm)   Pulse 86   Temp 98.2 F (36.8 C) (Oral)   Resp 17   SpO2 94%   Physical Exam Vitals and nursing note reviewed.   Constitutional:      Appearance: He is well-developed.  HENT:     Head: Normocephalic and atraumatic.  Eyes:     Pupils: Pupils are equal, round, and reactive to light.  Neck:     Vascular: No JVD.  Cardiovascular:     Rate and Rhythm: Normal rate and regular rhythm.     Heart sounds: No murmur heard.    No friction rub. No gallop.  Pulmonary:     Effort: No respiratory distress.     Breath sounds: No wheezing.  Abdominal:     General: There is no distension.     Tenderness: There is abdominal tenderness. There is no guarding or rebound.     Comments: Mild diffuse abdominal discomfort.  Musculoskeletal:        General: Normal range of motion.     Cervical back: Normal range of motion and neck supple.  Skin:    Coloration: Skin is not pale.     Findings: No rash.  Neurological:     Mental Status: He is alert.     Comments: Patient seems confused to questioning  Psychiatric:        Behavior: Behavior normal.     (all labs ordered are listed, but only abnormal results are displayed) Labs Reviewed  CBC WITH DIFFERENTIAL/PLATELET - Abnormal; Notable for the following components:      Result Value   WBC 11.7 (*)    Hemoglobin 12.6 (*)    RDW 18.9 (*)    Neutro Abs 9.7 (*)    Abs Immature Granulocytes 0.09 (*)    All other components within normal limits  COMPREHENSIVE METABOLIC PANEL WITH GFR - Abnormal; Notable for the following components:   Sodium 133 (*)    Chloride 94 (*)    Glucose, Bld 291 (*)    Total Protein 6.4 (*)    Total Bilirubin 1.9 (*)    All other components within normal limits  URINALYSIS, W/ REFLEX TO CULTURE (INFECTION SUSPECTED) - Abnormal; Notable for the following components:   APPearance CLOUDY (*)    Glucose, UA >=500 (*)    Hgb urine dipstick SMALL (*)    Ketones, ur 5 (*)    Protein, ur 100 (*)    Leukocytes,Ua LARGE (*)    Bacteria, UA RARE (*)    All other components within normal limits  I-STAT CHEM 8, ED - Abnormal; Notable for  the following components:   Sodium 134 (*)    Chloride 95 (*)    Glucose, Bld 300 (*)    All other components within normal limits  I-STAT CG4 LACTIC ACID, ED - Abnormal; Notable for the following components:   Lactic Acid, Venous 2.1 (*)    All other components within normal limits  CULTURE, BLOOD (ROUTINE X 2)  CULTURE, BLOOD (ROUTINE X 2)  URINE CULTURE  LIPASE,  BLOOD    EKG: None  Radiology: CT ABDOMEN PELVIS W CONTRAST Result Date: 12/26/2023 CLINICAL DATA:  Abdominal pain, acute, nonlocalized EXAM: CT ABDOMEN AND PELVIS WITH CONTRAST TECHNIQUE: Multidetector CT imaging of the abdomen and pelvis was performed using the standard protocol following bolus administration of intravenous contrast. RADIATION DOSE REDUCTION: This exam was performed according to the departmental dose-optimization program which includes automated exposure control, adjustment of the mA and/or kV according to patient size and/or use of iterative reconstruction technique. CONTRAST:  OMNIPAQUE  IOHEXOL  300 MG/ML  SOLN COMPARISON:  Multiple priors, most recently obtained 12/19/2023 and 12/13/2023. 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 There is mild atelectasis at both lung bases. No significant pleural effusion or basilar pneumothorax. Hepatobiliary: The liver is normal in density without suspicious focal abnormality. Small calcified gallstones are again noted. The gallbladder remains distended, although demonstrates no wall thickening or surrounding inflammation. There is no significant biliary dilatation. Pancreas: Mild atrophy. No pancreatic ductal dilatation or surrounding inflammation. Spleen: Scattered calcified granulomas. The spleen is normal in size without other focal abnormality. Adrenals/Urinary Tract: Both adrenal glands appear normal. No evidence of urinary tract calculus, suspicious renal lesion or hydronephrosis. A Foley catheter remains in place. The  bladder is decompressed with diffuse wall thickening, similar to previous study. Stomach/Bowel: No enteric contrast administered. Mild distal esophageal wall thickening. The stomach appears unremarkable for its degree of distention. No bowel distension, wall thickening or surrounding inflammation identified. The appendix is tendon of Leigh identified and appears normal. There is mildly prominent stool throughout the colon. Vascular/Lymphatic: There are no enlarged abdominal or pelvic lymph nodes. Mild aortic and branch vessel atherosclerosis. No evidence of aneurysm or large vessel occlusion. Reproductive: Stable mild enlargement of the prostate gland. Other: No evidence of abdominal wall mass or hernia. No ascites or pneumoperitoneum. 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 New mild superior endplate compression deformity at T12 without osseous retropulsion or significant paraspinal soft tissue abnormality. IMPRESSION: 1. No acute intra-abdominal findings or explanation for the patient's symptoms. 2. New mild superior endplate compression deformity at T12 without osseous retropulsion or significant paraspinal soft tissue abnormality. Correlate for point tenderness and recent injury. 3. Cholelithiasis without evidence of cholecystitis or biliary dilatation. 4. Decompressed bladder with persistent diffuse wall thickening. 5. Mildly prominent stool throughout the colon suggesting constipation. 6.  Aortic Atherosclerosis (ICD10-I70.0). Electronically Signed   By: Elsie Perone M.D.   On: 12/26/2023 12:01     Procedures   Medications Ordered in the ED  sodium phosphate  (FLEET) enema 1 enema (0 enemas Rectal Hold 12/26/23 1424)  sodium chloride  0.9 % bolus 1,000 mL (0 mLs Intravenous Stopped 12/26/23 1255)  morphine  (PF) 4 MG/ML injection 4 mg (4 mg Intravenous Given 12/26/23 1053)  ondansetron  (ZOFRAN ) injection 4 mg (4 mg Intravenous Given 12/26/23 1052)   iohexol  (OMNIPAQUE ) 300 MG/ML solution 100 mL (100 mLs Intravenous Contrast Given 12/26/23 1129)  fluconazole  (DIFLUCAN ) tablet 150 mg (150 mg Oral Given 12/26/23 1301)  ondansetron  (ZOFRAN ) injection 4 mg (4 mg Intravenous Given 12/26/23 1433)  morphine  (PF) 4 MG/ML injection 4 mg (4 mg Intravenous Given 12/26/23 1433)                                    Medical Decision Making Amount and/or Complexity of Data Reviewed Labs: ordered. Radiology: ordered.  Risk OTC drugs. Prescription  drug management. Decision regarding hospitalization.   78 yo M with a chief complaint of abdominal pain.  Patient seems a bit confused and I am not sure exactly what his baseline is.  He tells me has been having abdominal pain for about a few days.  He is actually in the hospital and discharged just a few days ago.  States that nausea vomiting and diarrhea.  Reportedly in a facility while and treated for a urinary tract infection.  On my record review it looks like the patient was just hospitalized for pneumonia.  Also recently had strep bacteremia.  Lactate essentially normal at 2.1.  No significant electrolyte abnormalities.  UA with large white cells, rare bacteria.  Significant yeast which was present on his initial presentation as well.  Will give a dose of Diflucan  here.  CT imaging without obvious intra-abdominal pathology.  Patient seems to be doing a bit better on repeat assessment.  Oral trial.  Patient continues to feel well was able to tolerate by mouth here without issue.  Patient's wife has arrived and she is worried she cannot take care of him at home.  [place] that he has been hard to take care of since he was discharged.  Has not had a bowel movement in 5 days.   Will see if social work can provide any further home health services.  Will give an enema at bedside.  Patient having a recurrent episode of abdominal pain.  Recurrent nausea and vomiting.  Will discuss with medicine.   The  patients results and plan were reviewed and discussed.   Any x-rays performed were independently reviewed by myself.   Differential diagnosis were considered with the presenting HPI.  Medications  sodium phosphate  (FLEET) enema 1 enema (0 enemas Rectal Hold 12/26/23 1424)  sodium chloride  0.9 % bolus 1,000 mL (0 mLs Intravenous Stopped 12/26/23 1255)  morphine  (PF) 4 MG/ML injection 4 mg (4 mg Intravenous Given 12/26/23 1053)  ondansetron  (ZOFRAN ) injection 4 mg (4 mg Intravenous Given 12/26/23 1052)  iohexol  (OMNIPAQUE ) 300 MG/ML solution 100 mL (100 mLs Intravenous Contrast Given 12/26/23 1129)  fluconazole  (DIFLUCAN ) tablet 150 mg (150 mg Oral Given 12/26/23 1301)  ondansetron  (ZOFRAN ) injection 4 mg (4 mg Intravenous Given 12/26/23 1433)  morphine  (PF) 4 MG/ML injection 4 mg (4 mg Intravenous Given 12/26/23 1433)    Vitals:   12/26/23 1019 12/26/23 1155 12/26/23 1408  BP: (!) 216/81 132/61 (!) 177/87  Pulse: 79 84 86  Resp: 14 11 17   Temp: 98.1 F (36.7 C)  98.2 F (36.8 C)  TempSrc: Oral  Oral  SpO2: 97% 97% 94%    Final diagnoses:  Generalized abdominal pain  Intractable nausea and vomiting    Admission/ observation were discussed with the admitting physician, patient and/or family and they are comfortable with the plan.        Final diagnoses:  Generalized abdominal pain  Intractable nausea and vomiting    ED Discharge Orders          Ordered    Home Health        12/26/23 1348    Face-to-face encounter (required for Medicare/Medicaid patients)       Comments: I Toribio Belvie Quale certify that this patient is under my care and that I, or a nurse practitioner or physician's assistant working with me, had a face-to-face encounter that meets the physician face-to-face encounter requirements with this patient on 12/26/2023. The encounter with the patient was in whole, or in  part for the following medical condition(s) which is the primary reason for home health care (List  medical condition): Patient dehabilitated post his hospitalization having difficulty ambulating without significant assistance.   12/26/23 1348               Emil Share, DO 12/26/23 1531

## 2023-12-26 NOTE — H&P (Signed)
 History and Physical    John [name] FMW:[id number] DOB: [birth year] DOA: 12/26/2023  PCP: [name], DO   Patient coming from: Home   Chief Complaint: Abdominal pain, N/V   HPI: John [name] is a 78 y.o. male with medical history significant for hypertension, hyperlipidemia, type 2 diabetes mellitus, history of CVA, cognitive deficits, CAD, chronic HFrEF, provoked DVT no longer anticoagulated, BPH, urinary retention with Foley catheter, and recent admissions with UTI, bacteremia, and pneumonia who now returns with abdominal pain, nausea, and vomiting.  Patient returned home from the hospital on 12/22/2023, had been doing fairly well initially, but then developed recurrent abdominal pain with nausea and nonbloody vomiting last night.  Since then, he has continued to grimace while gesturing to his periumbilical abdomen.  He has also been vomiting repeatedly and unable to tolerate anything by mouth.  His wife does not believe that he has moved his bowels since he returned home.  She does not feel that she can adequately care for him and inquires about nursing home placement.  ED Course: Upon arrival to the ED, patient is found to be afebrile and saturating mid 90s on room air with normal HR and elevated BP.  Labs are most notable for glucose 291, normal creatinine, WBC 11,700, and lactic acid 2.1.  Blood and urine cultures were collected in the ED and the patient was given a liter of saline, Zofran  x 2, morphine , Diflucan , and Fleet enema.  Review of Systems:  All other systems reviewed and apart from HPI, are negative.  Past Medical History:  Diagnosis Date   BPH (benign prostatic hyperplasia)    CTS (carpal tunnel syndrome)    Depression    Diabetes mellitus    Essential hypertension    GERD (gastroesophageal reflux disease)    History of breast cancer    Hypercholesteremia    Neuropathy    Stroke [facility])     Past Surgical History:  Procedure Laterality Date    CATARACT EXTRACTION, BILATERAL  01/18/2017   CORONARY STENT INTERVENTION N/A 08/10/2023   Procedure: CORONARY STENT INTERVENTION;  Surgeon: Darron Deatrice LABOR, MD;  Location: [facility] INVASIVE CV LAB;  Service: Cardiovascular;  Laterality: N/A;   LEFT HEART CATH AND CORONARY ANGIOGRAPHY N/A 08/10/2023   Procedure: LEFT HEART CATH AND CORONARY ANGIOGRAPHY;  Surgeon: Darron Deatrice LABOR, MD;  Location: [facility] INVASIVE CV LAB;  Service: Cardiovascular;  Laterality: N/A;   LITHOTRIPSY     MASTECTOMY Right 05/02/2018   PACEMAKER IMPLANT N/A 05/13/2023   Procedure: PACEMAKER IMPLANT;  Surgeon: Kennyth Chew, MD;  Location: [facility] INVASIVE CV LAB;  Service: Cardiovascular;  Laterality: N/A;   TEMPORARY PACEMAKER N/A 05/11/2023   Procedure: TEMPORARY PACEMAKER;  Surgeon: Swazil[place], Peter M, MD;  Location: [facility] INVASIVE CV LAB;  Service: Cardiovascular;  Laterality: N/A;   TONSILLECTOMY AND ADENOIDECTOMY     TRANSESOPHAGEAL ECHOCARDIOGRAM (CATH LAB) N/A 12/08/2023   Procedure: TRANSESOPHAGEAL ECHOCARDIOGRAM;  Surgeon: Raford Riggs, MD;  Location: [facility] INVASIVE CV LAB;  Service: Cardiovascular;  Laterality: N/A;    Social History:   reports that he has never smoked. He has never used smokeless tobacco. He reports that he does not drink alcohol and does not use drugs.  Allergies  Allergen Reactions   Ramipril Anaphylaxis   Adhesive [Tape] Rash   Other Diarrhea    Severe intolerance to Chemotherapy in the past.   Sertraline Other (See Comments)    Extreme headaches    Family History  Problem Relation Age of Onset  Lymphoma Mother    Cancer Mother    Diabetes Father    Stroke Father    Ovarian cancer Sister    Cancer Sister    Cancer Paternal Grandmother      Prior to Admission medications   Medication Sig Start Date End Date Taking? Authorizing Provider  amoxicillin -clavulanate (AUGMENTIN ) 875-125 MG tablet Take 1 tablet by mouth every 12 (twelve) hours for 3 days. 12/23/23 12/26/23  Jillian Buttery, MD   Ascorbic Acid  (VITAMIN C) 1000 MG tablet Take 1,000 mg by mouth 2 (two) times a week.    [provider]  atorvastatin  (LIPITOR ) 80 MG tablet Take 1 tablet (80 mg total) by mouth at bedtime. 09/14/23   [name], DO  clopidogrel  (PLAVIX ) 75 MG tablet Take 1 tablet (75 mg total) by mouth daily with breakfast. 10/11/23   [name], DO  clotrimazole -betamethasone  (LOTRISONE ) cream Apply 1 Application topically daily. Patient taking differently: Apply 1 Application topically daily as needed (rash). 07/11/23   [name], DO  cyanocobalamin  1000 MCG tablet Take 1 tablet (1,000 mcg total) by mouth daily. Patient taking differently: Take 1,000 mcg by mouth 3 (three) times a week. 09/14/23   [name], DO  escitalopram  (LEXAPRO ) 10 MG tablet Take 1 tablet (10 mg total) by mouth daily. 10/03/23   [name], DO  ferrous sulfate  (FEROSUL) 325 (65 FE) MG tablet Take 1 tablet (325 mg total) by mouth daily with breakfast. 11/28/23   Frann, Mabel Mt, DO  finasteride  (PROSCAR ) 5 MG tablet Take 1 tablet (5 mg total) by mouth daily. *Need appointment for future refills.* 11/28/23   Wendling, Mabel Mt, DO  hyoscyamine  (LEVSIN  SL) 0.125 MG SL tablet Place 1 tablet (0.125 mg total) under the tongue every 4 (four) hours as needed for bladder spasms. 08/17/23   Angiulli, Toribio PARAS, PA-C  loperamide  (IMODIUM  A-D) 2 MG tablet Take 2 mg by mouth as needed for diarrhea or loose stools.    [provider]  losartan  (COZAAR ) 25 MG tablet Take 1 tablet (25 mg total) by mouth daily. 12/23/23   Jillian Buttery, MD  Magnesium  Citrate 200 MG TABS Take 200 mg by mouth 2 (two) times daily before a meal. Patient not taking: Reported on 12/20/2023 12/15/23   Lue [name], MD  metFORMIN  (GLUCOPHAGE ) 500 MG tablet Take 1 tablet (500 mg total) by mouth 2 (two) times daily with a meal. 12/22/23   Jillian Buttery, MD  Multiple Vitamin (MULTIVITAMIN)  tablet Take 1 tablet by mouth 2 (two) times a week.    [provider]  ondansetron  (ZOFRAN -ODT) 4 MG disintegrating tablet Take 1 tablet (4 mg total) by mouth every 8 (eight) hours as needed for nausea or vomiting. 12/23/23   Frann, Mabel Mt, DO  pantoprazole  (PROTONIX ) 40 MG tablet Take 1 tablet (40 mg total) by mouth at bedtime. 12/19/23   [name], DO  senna-docusate (SENOKOT-S) 8.6-50 MG tablet Take 1 tablet by mouth 2 (two) times daily. Patient taking differently: Take 1 tablet by mouth daily as needed for mild constipation or moderate constipation. 12/15/23   Lue [name], MD  amLODipine  (NORVASC ) 2.5 MG tablet Take 1 tablet (2.5 mg total) by mouth daily. 06/15/23 06/30/23  [name], DO  apixaban  (ELIQUIS ) 5 MG TABS tablet Take 1 tablet (5 mg total) by mouth 2 (two) times daily. 10/11/23 10/11/23  [name], DO    Physical Exam: Vitals:   12/26/23 1019 12/26/23 1155  12/26/23 1408  BP: (!) 216/81 132/61 (!) 177/87  Pulse: 79 84 86  Resp: 14 11 17   Temp: 98.1 F (36.7 C)  98.2 F (36.8 C)  TempSrc: Oral  Oral  SpO2: 97% 97% 94%     Constitutional: NAD, no pallor or diaphoresis  Eyes: PERTLA, lids and conjunctivae normal ENMT: Mucous membranes are moist. Posterior pharynx clear of any exudate or lesions.   Neck: supple, no masses  Respiratory: no wheezing, no crackles. No accessory muscle use.  Cardiovascular: S1 & S2 heard, regular rate and rhythm. No extremity edema. No significant JVD. Abdomen: Soft, no guarding. Bowel sounds active.  Musculoskeletal: no clubbing / cyanosis. No joint deformity upper and lower extremities.   Skin: no significant rashes, lesions, ulcers. Warm, dry, well-perfused. Neurologic: CN 2-12 grossly intact. Moving all extremities. Alert and oriented to person.  Psychiatric: Calm. Cooperative.    Labs and Imaging on Admission: I have personally reviewed following labs and imaging  studies  CBC: Recent Labs  Lab 12/19/23 1959 12/20/23 0520 12/21/23 0701 12/22/23 0459 12/26/23 1040 12/26/23 1112  WBC 12.9* 12.3* 7.7 8.7 11.7*  --   NEUTROABS  --   --   --   --  9.7*  --   HGB 12.1* 11.1* 11.7* 11.5* 12.6* 13.3  HCT 37.9* 34.3* 36.5* 35.1* 39.1 39.0  MCV 87.7 88.9 88.8 88.2 86.7  --   PLT 235 197 171 170 226  --    Basic Metabolic Panel: Recent Labs  Lab 12/19/23 1625 12/20/23 0520 12/21/23 0701 12/22/23 0459 12/26/23 1040 12/26/23 1112  NA 134* 135 140 140 133* 134*  K 4.2 3.9 3.5 3.6 3.5 3.6  CL 98 100 103 103 94* 95*  CO2 18* 21* 24 22 25   --   GLUCOSE 167* 159* 147* 141* 291* 300*  BUN 15 13 7* 9 8 8   CREATININE 0.99 0.78 0.64 0.69 0.66 0.70  CALCIUM  10.2 9.3 9.6 9.3 9.8  --    GFR: Estimated Creatinine Clearance: 84.9 mL/min (by C-G formula based on SCr of 0.7 mg/dL). Liver Function Tests: Recent Labs  Lab 12/19/23 1959 12/26/23 1040  AST 30 20  ALT 33 21  ALKPHOS 70 76  BILITOT 2.2* 1.9*  PROT 6.1* 6.4*  ALBUMIN 3.9 3.9   Recent Labs  Lab 12/26/23 1040  LIPASE 19   Recent Labs  Lab 12/19/23 2138  AMMONIA 18   Coagulation Profile: No results for input(s): INR, PROTIME in the last 168 hours. Cardiac Enzymes: No results for input(s): CKTOTAL, CKMB, CKMBINDEX, TROPONINI in the last 168 hours. BNP (last 3 results) No results for input(s): PROBNP in the last 8760 hours. HbA1C: No results for input(s): HGBA1C in the last 72 hours. CBG: Recent Labs  Lab 12/21/23 1147 12/21/23 1643 12/21/23 2138 12/22/23 0737 12/22/23 1147  GLUCAP 181* 138* 130* 131* 274*   Lipid Profile: No results for input(s): CHOL, HDL, LDLCALC, TRIG, CHOLHDL, LDLDIRECT in the last 72 hours. Thyroid  Function Tests: No results for input(s): TSH, T4TOTAL, FREET4, T3FREE, THYROIDAB in the last 72 hours. Anemia Panel: No results for input(s): VITAMINB12, FOLATE, FERRITIN, TIBC, IRON, RETICCTPCT in the  last 72 hours. Urine analysis:    Component Value Date/Time   COLORURINE YELLOW 12/26/2023 1053   APPEARANCEUR CLOUDY (A) 12/26/2023 1053   LABSPEC 1.014 12/26/2023 1053   PHURINE 6.0 12/26/2023 1053   GLUCOSEU >=500 (A) 12/26/2023 1053   HGBUR SMALL (A) 12/26/2023 1053   BILIRUBINUR NEGATIVE 12/26/2023 1053   KETONESUR  5 (A) 12/26/2023 1053   PROTEINUR 100 (A) 12/26/2023 1053   NITRITE NEGATIVE 12/26/2023 1053   LEUKOCYTESUR LARGE (A) 12/26/2023 1053   Sepsis Labs: @LABRCNTIP (procalcitonin:4,lacticidven:4) ) Recent Results (from the past 240 hours)  Blood culture (routine x 2)     Status: None   Collection Time: 12/19/23  8:11 PM   Specimen: BLOOD  Result Value Ref Range Status   Specimen Description   Final    BLOOD SITE NOT SPECIFIED Performed at [facility], [address], [place], [place] [address]    Special Requests   Final    Blood Culture results may not be optimal due to an inadequate volume of blood received in culture bottles BOTTLES DRAWN AEROBIC AND ANAEROBIC Performed at [facility], [address] [place], [place] [address]    Culture   Final    NO GROWTH 5 DAYS Performed at [facility], [address] [place], [place] [address]    Report Status 12/24/2023 FINAL  Final  Blood culture (routine x 2)     Status: None   Collection Time: 12/19/23  8:11 PM   Specimen: BLOOD  Result Value Ref Range Status   Specimen Description   Final    BLOOD SITE NOT SPECIFIED Performed at [facility], [address], [place], [place] [address]    Special Requests   Final    Blood Culture adequate volume BOTTLES DRAWN AEROBIC AND ANAEROBIC Performed at [facility], [address] [place], [place] [address]    Culture   Final    NO GROWTH 5 DAYS Performed at [facility], [address], [place], [place] [address]    Report Status 12/24/2023 FINAL  Final  Urine Culture      Status: Abnormal   Collection Time: 12/19/23  9:02 PM   Specimen: Urine, Catheterized  Result Value Ref Range Status   Specimen Description   Final    URINE, CATHETERIZED Performed at [facility], [address] [place], [place] [address]    Special Requests   Final    NONE Performed at [facility], [address], [place], [place] [address]    Culture >=100,000 COLONIES/mL YEAST (A)  Final   Report Status 12/20/2023 FINAL  Final     Radiological Exams on Admission: CT ABDOMEN PELVIS W CONTRAST Result Date: 12/26/2023 CLINICAL DATA:  Abdominal pain, acute, nonlocalized EXAM: CT ABDOMEN AND PELVIS WITH CONTRAST TECHNIQUE: Multidetector CT imaging of the abdomen and pelvis was performed using the standard protocol following bolus administration of intravenous contrast. RADIATION DOSE REDUCTION: This exam was performed according to the departmental dose-optimization program which includes automated exposure control, adjustment of the mA and/or kV according to patient size and/or use of iterative reconstruction technique. CONTRAST:  OMNIPAQUE  IOHEXOL  300 MG/ML  SOLN COMPARISON:  Multiple priors, most recently obtained 12/19/2023 and 12/13/2023. 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 There is mild atelectasis at both lung bases. No significant pleural effusion or basilar pneumothorax. Hepatobiliary: The liver is normal in density without suspicious focal abnormality. Small calcified gallstones are again noted. The gallbladder remains distended, although demonstrates no wall thickening or surrounding inflammation. There is no significant biliary dilatation. Pancreas: Mild atrophy. No pancreatic ductal dilatation or surrounding inflammation. Spleen: Scattered calcified granulomas. The spleen is normal in size without other focal abnormality. Adrenals/Urinary Tract: Both adrenal glands appear normal. No  evidence of urinary tract calculus, suspicious renal lesion or hydronephrosis. A Foley catheter remains in place. The bladder is decompressed with diffuse wall thickening, similar to previous study. Stomach/Bowel: No enteric contrast administered. Mild distal esophageal wall thickening. The stomach appears unremarkable for its degree of distention. No bowel distension, wall thickening or surrounding inflammation identified. The appendix is tendon of Leigh identified and appears normal. There is mildly prominent stool throughout the colon. Vascular/Lymphatic: There are no enlarged abdominal or pelvic lymph nodes. Mild aortic and branch vessel atherosclerosis. No evidence of aneurysm or large vessel occlusion. Reproductive: Stable mild enlargement of the prostate gland. Other: No evidence of abdominal wall mass or hernia. No ascites or pneumoperitoneum. 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 New mild superior endplate compression deformity at T12 without osseous retropulsion or significant paraspinal soft tissue abnormality. IMPRESSION: 1. No acute intra-abdominal findings or explanation for the patient's symptoms. 2. New mild superior endplate compression deformity at T12 without osseous retropulsion or significant paraspinal soft tissue abnormality. Correlate for point tenderness and recent injury. 3. Cholelithiasis without evidence of cholecystitis or biliary dilatation. 4. Decompressed bladder with persistent diffuse wall thickening. 5. Mildly prominent stool throughout the colon suggesting constipation. 6.  Aortic Atherosclerosis (ICD10-I70.0). Electronically Signed   By: Elsie Perone M.D.   On: 12/26/2023 12:01    Assessment/Plan   1. Nausea and vomiting  - Exam is benign and there are no acute findings on CT  - Continue IVF hydration, continue to treat constipation, monitor electrolytes, trial clears and advance as tolerated    2. Chronic systolic  CHF  - Appears hypovolemic; therapies have been limited by hypotension  - Hold Lasix , cautiously continue IVF hydration, monitor volume status     3. Type 2 DM  - A1c was 7.5% in April 2025  - Check CBGs and use low-intensity SSI for now     4. CAD  - No anginal symptoms  - Continue Lipitor  and Plavix      5. Depression  - Continue Lexapro      6. Breast cancer  - Follows with [facility] and is s/p mastectomy and chemotherapy, stopped Tamoxifen  early due to PE    7. Hx of VTE   - Was considered provoked and he completed treatment    8. Cognitive impairment - Delirium precautions      DVT prophylaxis: Lovenox   Code Status: DNR  Level of Care: Level of care: Telemetry Family Communication: Wife at bedside  Disposition Plan:  Patient is from: Home   Anticipated d/c is to: TBD Anticipated d/c date is: 9/16 or 12/28/23  Patient currently: Pending tolerance of adequate oral intake  Consults called: None  Admission status: Observation     Evalene GORMAN Sprinkles, MD [facility]  12/26/2023, 3:58 PM

## 2023-12-26 NOTE — Care Management (Addendum)
 Transition of Care [facility]) - Emergency Department Mini Assessment   Patient Details  Name: John [name] MRN: [id number] Date of Birth: Jul 04, 1945  Transition of Care [facility]) CM/SW Contact:    Corean JAYSON Canary, RN Phone Number: 12/26/2023, 2:21 PM   Clinical Narrative:  Chart reviewed discussed with team. Wife expressing to MD that she needs assistance at home. Called wife, left message to return call to discuss.  According to [facility] the patient last had [facility] for home health , messaged [facility], He is still active with them for Pt OT aide and Nursing 1430 Mrs Sakata called back, she is interested in CIR, this really helped last time he was here. He did go to [facility] before It was awful. Explained that CIR was for patients who were admitted to hospital and INP. Described process for SNF.  Will see if patient would require admission.  ED Mini Assessment:             Interventions which prevented an admission or readmission: Home Health Consult or Services    Patient Contact and Communications        ,                 Admission diagnosis:  abd pain;poss sepsis Patient Active Problem List   Diagnosis Date Noted   AMS (altered mental status) 12/13/2023   Altered mental status 12/09/2023   Nausea & vomiting 12/09/2023   Pacemaker 12/09/2023   Sepsis (HCC) 12/04/2023   CAD S/P percutaneous coronary angioplasty 12/04/2023   HFrEF (heart failure with reduced ejection fraction) (HCC) 08/17/2023   DVT, femoral, chronic (HCC) 08/15/2023   Bladder outlet obstruction 08/15/2023   Sepsis due to Escherichia coli (HCC) 08/15/2023   Debility 08/13/2023   Non-ST elevation (NSTEMI) myocardial infarction (HCC) 08/10/2023   Cholelithiasis 08/07/2023   VTE (venous thromboembolism) 08/07/2023   Lactic acidosis 08/07/2023   Sacral pressure ulcer 08/07/2023   Streptococcal bacteremia 08/07/2023   B12 deficiency 08/07/2023   Iron deficiency anemia 08/07/2023   Severe sepsis  (HCC) 08/06/2023   Chronic diastolic CHF (congestive heart failure) (HCC) 08/06/2023   Bacteriuria 08/06/2023   Essential hypertension 06/15/2023   Situational depression 06/15/2023   Protein-calorie malnutrition, severe 05/12/2023   Heart block AV complete (HCC) 05/12/2023   Syncope 05/10/2023   Fall at home, initial encounter 05/08/2023   Hypotension 05/08/2023   Abnormal LFTs 05/08/2023   Anemia 05/08/2023   Coping style affecting medical condition 05/02/2023   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) 04/28/2023   Brainstem infarct, acute (HCC) 04/25/2023   Pressure injury of skin 04/25/2023   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   Acute kidney injury 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) 04/17/2023   DKA (diabetic ketoacidosis) (HCC) 04/16/2023   Type 2 diabetes mellitus with hyperglycemia, with long-term current use of insulin  (HCC) 07/26/2022   Bronchitis 03/20/2021   Acute non-recurrent frontal sinusitis 03/20/2021   Viral URI with cough 01/13/2021   Hyperlipidemia 11/21/2020   Dizziness and giddiness 10/20/2020   Localized swelling of both lower extremities 10/20/2020   Tendinitis of right hip flexor 10/20/2020   Aortic atherosclerosis (HCC) 09/10/2020   Insomnia 09/10/2020   Cognitive impairment 08/18/2020   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   Acute urinary retention 06/04/2020   History of COVID-19 12/10/2019   Physical deconditioning 12/10/2019   Fatigue 12/10/2019   Unsteady gait 12/10/2019   Pneumonia due to COVID-19 virus 10/26/2019   Peripheral neuropathy 10/17/2019   Depression,  major, single episode, 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/2019   History of breast cancer    GERD (gastroesophageal reflux disease)    BPH (benign prostatic hyperplasia)    CTS (carpal tunnel syndrome)    Diabetic polyneuropathy associated with type 2 diabetes mellitus (HCC) 09/29/2019   Neuropathic pain of foot 09/29/2019   Foley  catheter in place 01/03/2019   Peripheral sensory neuropathy 05/17/2018   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   Hypomagnesemia 01/18/2018   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) 12/13/2017   Axillary adenopathy 12/09/2017   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   S/P cataract extraction and insertion of intraocular lens 01/17/2017   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   PVD (posterior vitreous detachment), left 01/07/2017   Hyperopia of both eyes with astigmatism 11/09/2016   Class 1 obesity due to excess calories without serious comorbidity with body mass index (BMI) of 31.0 to 31.9 in adult 09/17/2016   Encounter for monitoring tamoxifen  therapy 03/05/2016   Insulin  dependent type 2 diabetes mellitus (HCC) 07/02/2015   Diabetes 1.5, 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 [place] [address] Phone: [phone] Fax: [phone]

## 2023-12-26 NOTE — ED Triage Notes (Signed)
 BIB EMS from home for abd pain, N/V starting last night. Declined EMS transport lastnight, but worsened overnight. Attempted PO meds at home without relief. Last BM 1 week ago. Just finished a course of abx of uti - has foley in place. Waiting on urology for removal. Hx hyperemesis. Co2 28-33 RR 28 HR 90 BP190/92 SpO2 95% on RA CBG 235 - hx diabetes

## 2023-12-26 NOTE — Plan of Care (Signed)

## 2023-12-27 ENCOUNTER — Other Ambulatory Visit: Payer: Self-pay

## 2023-12-27 ENCOUNTER — Inpatient Hospital Stay: Admitting: Family Medicine

## 2023-12-27 DIAGNOSIS — R339 Retention of urine, unspecified: Secondary | ICD-10-CM | POA: Diagnosis not present

## 2023-12-27 DIAGNOSIS — I2510 Atherosclerotic heart disease of native coronary artery without angina pectoris: Secondary | ICD-10-CM | POA: Diagnosis not present

## 2023-12-27 DIAGNOSIS — R112 Nausea with vomiting, unspecified: Secondary | ICD-10-CM | POA: Diagnosis not present

## 2023-12-27 DIAGNOSIS — R4189 Other symptoms and signs involving cognitive functions and awareness: Secondary | ICD-10-CM | POA: Diagnosis not present

## 2023-12-27 LAB — GLUCOSE, CAPILLARY
Glucose-Capillary: 192 mg/dL — ABNORMAL HIGH (ref 70–99)
Glucose-Capillary: 193 mg/dL — ABNORMAL HIGH (ref 70–99)
Glucose-Capillary: 196 mg/dL — ABNORMAL HIGH (ref 70–99)
Glucose-Capillary: 213 mg/dL — ABNORMAL HIGH (ref 70–99)
Glucose-Capillary: 223 mg/dL — ABNORMAL HIGH (ref 70–99)
Glucose-Capillary: 285 mg/dL — ABNORMAL HIGH (ref 70–99)

## 2023-12-27 LAB — HEPATIC FUNCTION PANEL
ALT: 18 U/L (ref 0–44)
AST: 16 U/L (ref 15–41)
Albumin: 3.6 g/dL (ref 3.5–5.0)
Alkaline Phosphatase: 68 U/L (ref 38–126)
Bilirubin, Direct: 0.7 mg/dL — ABNORMAL HIGH (ref 0.0–0.2)
Indirect Bilirubin: 1.2 mg/dL — ABNORMAL HIGH (ref 0.3–0.9)
Total Bilirubin: 2 mg/dL — ABNORMAL HIGH (ref 0.0–1.2)
Total Protein: 5.7 g/dL — ABNORMAL LOW (ref 6.5–8.1)

## 2023-12-27 LAB — CBC
HCT: 36.5 % — ABNORMAL LOW (ref 39.0–52.0)
Hemoglobin: 11.8 g/dL — ABNORMAL LOW (ref 13.0–17.0)
MCH: 28.1 pg (ref 26.0–34.0)
MCHC: 32.3 g/dL (ref 30.0–36.0)
MCV: 86.9 fL (ref 80.0–100.0)
Platelets: 240 K/uL (ref 150–400)
RBC: 4.2 MIL/uL — ABNORMAL LOW (ref 4.22–5.81)
RDW: 19.2 % — ABNORMAL HIGH (ref 11.5–15.5)
WBC: 9.3 K/uL (ref 4.0–10.5)
nRBC: 0 % (ref 0.0–0.2)

## 2023-12-27 LAB — BASIC METABOLIC PANEL WITH GFR
Anion gap: 14 (ref 5–15)
BUN: 12 mg/dL (ref 8–23)
CO2: 26 mmol/L (ref 22–32)
Calcium: 9.7 mg/dL (ref 8.9–10.3)
Chloride: 97 mmol/L — ABNORMAL LOW (ref 98–111)
Creatinine, Ser: 0.68 mg/dL (ref 0.61–1.24)
GFR, Estimated: >60 mL/min (ref >60)
Glucose, Bld: 210 mg/dL — ABNORMAL HIGH (ref 70–99)
Potassium: 3.7 mmol/L (ref 3.5–5.1)
Sodium: 137 mmol/L (ref 135–145)

## 2023-12-27 LAB — PHOSPHORUS: Phosphorus: 3.6 mg/dL (ref 2.5–4.6)

## 2023-12-27 LAB — URINE CULTURE: Culture: 50000 — AB

## 2023-12-27 LAB — MAGNESIUM: Magnesium: 1.4 mg/dL — ABNORMAL LOW (ref 1.7–2.4)

## 2023-12-27 MED ORDER — MAGNESIUM SULFATE 4 GM/100ML IV SOLN
4.0000 g | Freq: Once | INTRAVENOUS | Status: AC
Start: 2023-12-27 — End: 2023-12-27
  Administered 2023-12-27: 4 g via INTRAVENOUS
  Filled 2023-12-27: qty 100

## 2023-12-27 MED ORDER — POLYETHYLENE GLYCOL 3350 17 G PO PACK
17.0000 g | PACK | Freq: Two times a day (BID) | ORAL | Status: DC
  Administered 2023-12-28 – 2024-01-01 (×8): 17 g via ORAL
  Filled 2023-12-27 (×12): qty 1

## 2023-12-27 MED ORDER — SODIUM CHLORIDE 0.9 % IV SOLN
1.0000 g | Freq: Every day | INTRAVENOUS | Status: DC
  Administered 2023-12-27: 1 g via INTRAVENOUS
  Filled 2023-12-27: qty 10

## 2023-12-27 MED ORDER — BISACODYL 10 MG RE SUPP
10.0000 mg | Freq: Every day | RECTAL | Status: DC | PRN
  Administered 2023-12-31: 10 mg via RECTAL
  Filled 2023-12-27: qty 1

## 2023-12-27 MED ORDER — INSULIN ASPART 100 UNIT/ML IJ SOLN
0.0000 [IU] | Freq: Three times a day (TID) | INTRAMUSCULAR | Status: DC
  Administered 2023-12-27: 2 [IU] via SUBCUTANEOUS
  Administered 2023-12-27: 3 [IU] via SUBCUTANEOUS
  Administered 2023-12-28: 2 [IU] via SUBCUTANEOUS
  Administered 2023-12-28: 3 [IU] via SUBCUTANEOUS
  Administered 2023-12-28 – 2023-12-29 (×2): 5 [IU] via SUBCUTANEOUS
  Administered 2023-12-29: 2 [IU] via SUBCUTANEOUS
  Administered 2023-12-29: 7 [IU] via SUBCUTANEOUS
  Administered 2023-12-30: 3 [IU] via SUBCUTANEOUS
  Administered 2023-12-30: 5 [IU] via SUBCUTANEOUS
  Administered 2023-12-30: 3 [IU] via SUBCUTANEOUS
  Administered 2023-12-31: 2 [IU] via SUBCUTANEOUS
  Administered 2023-12-31 – 2024-01-01 (×3): 3 [IU] via SUBCUTANEOUS
  Administered 2024-01-01: 5 [IU] via SUBCUTANEOUS

## 2023-12-27 MED ORDER — INSULIN ASPART 100 UNIT/ML IJ SOLN
0.0000 [IU] | Freq: Every day | INTRAMUSCULAR | Status: DC
  Administered 2023-12-27 – 2023-12-28 (×2): 3 [IU] via SUBCUTANEOUS
  Administered 2023-12-29: 2 [IU] via SUBCUTANEOUS
  Administered 2023-12-31: 3 [IU] via SUBCUTANEOUS

## 2023-12-27 MED ORDER — CHLORHEXIDINE GLUCONATE CLOTH 2 % EX PADS
6.0000 | MEDICATED_PAD | Freq: Every day | CUTANEOUS | Status: DC
  Administered 2023-12-27 – 2024-01-02 (×7): 6 via TOPICAL

## 2023-12-27 MED ORDER — SENNOSIDES-DOCUSATE SODIUM 8.6-50 MG PO TABS
1.0000 | ORAL_TABLET | Freq: Two times a day (BID) | ORAL | Status: DC
  Administered 2023-12-27 – 2024-01-02 (×11): 1 via ORAL
  Filled 2023-12-27 (×12): qty 1

## 2023-12-27 NOTE — Progress Notes (Signed)
 PROGRESS NOTE    John [name]  FMW:[id number] DOB: [birth year] DOA: 12/26/2023 PCP: [name], DO   Brief Narrative:  John [name] is a 78 y.o. male with medical history significant for hypertension, hyperlipidemia, type 2 diabetes mellitus, history of CVA, cognitive deficits, CAD, chronic HFrEF, provoked DVT no longer anticoagulated, BPH, urinary retention with Foley catheter, and recent admissions with UTI, bacteremia, and pneumonia who now returns with abdominal pain, nausea, and vomiting.   Patient returned home from the hospital on 12/22/2023, had been doing fairly well initially, but then developed recurrent abdominal pain with nausea and nonbloody vomiting last night.  Since then, he has continued to grimace while gesturing to his periumbilical abdomen.  He has also been vomiting repeatedly and unable to tolerate anything by mouth.  His wife does not believe that he has moved his bowels since he returned home.  She does not feel that she can adequately care for him and inquires about nursing home placement.  Nausea vomiting improved with the diet and been advanced and will continue to treat his constipation.  Assessment and Plan:  Nausea and Vomiting: Improving. Exam is benign and there No acute intra-abdominal findings or explanation for the patient's symptoms on CT Imaging.  CT imaging did show cholelithiasis without evidence of cholecystitis or biliary dilatation and there is mildly prominent stool throughout the colon suggesting constipation. IVF now stopped. Treating Constipation as below and will continue to Monitor Electrolytes. Was on CLD and now on SOFT  ? UTI: Has a Hx of Urinary Retention w/ Foley Cathter and recent admits w/ UTI and Bacteremia. Urinalysis was done and showed cloudy appearance with greater than 500 glucose, small hemoglobin, 5 ketones, large leukocytes, negative nitrites, rare bacteria and greater than 50 RBCs per high-power field and greater  than 50 WBCs.  Blood cultures x 2 pending and urine culture pending.  CT scan showed decompressed bladder with persistent diffuse wall thickening. Initiate IV Ceftriaxone  while awaiting Cx Results. WBC went from 11.7 -> 9.3. Change Foley Catheter.   T12 Compression Deformity: CT Scan done and showed new mild superior endplate compression deformity at T12 without osseous retropulsion or significant paraspinal soft tissue abnormality. Pain control and will need PT/OT to Evaluate and Treat.   Hypomagnesemia: Mag Level is now 1.4. Replete w/ IV Mag Sulfate 4 grams. CTM and Replete as Necessary. Repeat Mag Level in the AM   Chronic Systolic CHF: Appears hypovolemic; therapies have been limited by hypotension. Continue to hold Furosemide . Was cautiously hydrated w/ IVF but now stopped. Strict I's and O's and Daily Weights. CTM for S/Sx of Volume Overload   Diabetes Mellitus Type 2: A1c was 7.5% in April 2025; Changed Very Sensitive Novolog  SSI q4h to Sensitive Novolog  SSI AC/HS. CTM CBGs per protocol. CBG Trend:  Recent Labs  Lab 12/22/23 1147 12/26/23 1721 12/26/23 2059 12/27/23 0009 12/27/23 0431 12/27/23 0748 12/27/23 1222  GLUCAP 274* 238* 199* 196* 192* 213* 193*    CAD: Currently has No anginal symptoms; C/w Clopidogrel  75 mg po Dialy and Atorvastatin  80 mg po qHS  HLD: C/w Atorvastatin  80 m po qHS   Depression: C/w Esctialopram 10 mg po Daily   Hyponatremia: Improved. Na+ went from 134 -> 137. CTM and Trend and repeat CMP in the AM   Breast Cancer: Follows with [facility] and is s/p mastectomy and chemotherapy; stopped Tamoxifen  early due to PE    Hx of VTE:  Was considered provoked and he completed treatment  Cognitive Impairment: C/w Delirium precautions   Constipation: Change Bowel Regimen to Senna-Docusate 1 tab po BID, Miralax  17 grams po BID, and add Bisacodyl  10 mg RCprn  Hyperbilirubinemia: Mild. T Bili is now 2.0 w/ Direct Bili of 5.7 and Indirect Bilirubin is 1.2.  CTM and Trend and repeat CMP in the AM   Normocytic Anemia: Hgb/Hct Trend:  Recent Labs  Lab 12/19/23 1959 12/20/23 0520 12/21/23 0701 12/22/23 0459 12/26/23 1040 12/26/23 1112 12/27/23 0458  HGB 12.1* 11.1* 11.7* 11.5* 12.6* 13.3 11.8*  HCT 37.9* 34.3* 36.5* 35.1* 39.1 39.0 36.5*  MCV 87.7 88.9 88.8 88.2 86.7  --  86.9  -Check Anemia Panel in the AM. CTM for S/Sx of Bleeding; No overt bleeding noted. Repeat CBC in the AM  Overweight: Complicates overall prognosis and care. Estimated body mass index is 25.56 kg/m as calculated from the following:   Height as of 12/20/23: 6' (1.829 m).   Weight as of 12/20/23: 85.5 kg. Weight Loss and Dietary Counseling given   DVT prophylaxis: enoxaparin  (LOVENOX ) injection 40 mg Start: 12/26/23 2200    Code Status: Limited: Do not attempt resuscitation (DNR) -DNR-LIMITED -Do Not Intubate/DNI  Family Communication: No family present @ bedside  Disposition Plan:  Level of care: Telemetry Status is: Observation The patient will require care spanning > 2 midnights and should be moved to inpatient because: Needs further clinical improvement and evaluation by PT/OT to determine safe discharge disposition   Consultants:  None  Procedures:  As delineated as above  Antimicrobials:  Anti-infectives (From admission, onward)    Start     Dose/Rate Route Frequency Ordered Stop   12/27/23 1515  cefTRIAXone  (ROCEPHIN ) 1 g in sodium chloride  0.9 % 100 mL IVPB        1 g 200 mL/hr over 30 Minutes Intravenous Every 24 hours 12/27/23 1424     12/26/23 1300  fluconazole  (DIFLUCAN ) tablet 150 mg        150 mg Oral  Once 12/26/23 1255 12/26/23 1301       Subjective: And examined at bedside and thinks his nausea vomiting is doing better.  States that he has always had some eczema.  No lightheadedness or dizziness.  No other concerns or complaints at this time.  Objective: Vitals:   12/27/23 0242 12/27/23 0425 12/27/23 0731 12/27/23 1116  BP: (!) 163/83  (!) 169/83  (!) 169/83  Pulse: 85 78  78  Resp: 20 18  18   Temp:  98.4 F (36.9 C)  98.4 F (36.9 C)  TempSrc:  Oral  Oral  SpO2: 94% 96%    Weight:   83.6 kg 86.6 kg  Height:    6' (1.829 m)   No intake or output data in the 24 hours ending 12/27/23 1428 Filed Weights   12/27/23 0731 12/27/23 1116  Weight: 83.6 kg 86.6 kg   Examination: Physical Exam:  Constitutional: WN/WD overweight elderly Caucasian male in no acute distress Respiratory: Diminished to auscultation bilaterally, no wheezing, rales, rhonchi or crackles. Normal respiratory effort and patient is not tachypenic. No accessory muscle use.  Unlabored breathing Cardiovascular: RRR, no murmurs / rubs / gallops. S1 and S2 auscultated.  1+ extremity edema. Abdomen: Soft, minimally-tender, distended secondary body habitus bowel sounds positive.  GU: Deferred.  Foley catheter is in place. Musculoskeletal: No clubbing / cyanosis of digits/nails. No joint deformity upper and lower extremities.  Skin: No rashes, lesions, ulcers on limited skin evaluation. No induration; Warm and dry.  Neurologic: CN 2-12  grossly intact with no focal deficits. Romberg sign and cerebellar reflexes not assessed.  Psychiatric: Awake and alert  Data Reviewed: I have personally reviewed following labs and imaging studies  CBC: Recent Labs  Lab 12/21/23 0701 12/22/23 0459 12/26/23 1040 12/26/23 1112 12/27/23 0458  WBC 7.7 8.7 11.7*  --  9.3  NEUTROABS  --   --  9.7*  --   --   HGB 11.7* 11.5* 12.6* 13.3 11.8*  HCT 36.5* 35.1* 39.1 39.0 36.5*  MCV 88.8 88.2 86.7  --  86.9  PLT 171 170 226  --  240   Basic Metabolic Panel: Recent Labs  Lab 12/21/23 0701 12/22/23 0459 12/26/23 1040 12/26/23 1112 12/27/23 0458  NA 140 140 133* 134* 137  K 3.5 3.6 3.5 3.6 3.7  CL 103 103 94* 95* 97*  CO2 24 22 25   --  26  GLUCOSE 147* 141* 291* 300* 210*  BUN 7* 9 8 8 12   CREATININE 0.64 0.69 0.66 0.70 0.68  CALCIUM  9.6 9.3 9.8  --  9.7  MG  --    --   --   --  1.4*  PHOS  --   --   --   --  3.6   GFR: Estimated Creatinine Clearance: 84.9 mL/min (by C-G formula based on SCr of 0.68 mg/dL). Liver Function Tests: Recent Labs  Lab 12/26/23 1040 12/27/23 0458  AST 20 16  ALT 21 18  ALKPHOS 76 68  BILITOT 1.9* 2.0*  PROT 6.4* 5.7*  ALBUMIN 3.9 3.6   Recent Labs  Lab 12/26/23 1040  LIPASE 19   No results for input(s): AMMONIA in the last 168 hours. Coagulation Profile: No results for input(s): INR, PROTIME in the last 168 hours. Cardiac Enzymes: No results for input(s): CKTOTAL, CKMB, CKMBINDEX, TROPONINI in the last 168 hours. BNP (last 3 results) No results for input(s): PROBNP in the last 8760 hours. HbA1C: No results for input(s): HGBA1C in the last 72 hours. CBG: Recent Labs  Lab 12/26/23 2059 12/27/23 0009 12/27/23 0431 12/27/23 0748 12/27/23 1222  GLUCAP 199* 196* 192* 213* 193*   Lipid Profile: No results for input(s): CHOL, HDL, LDLCALC, TRIG, CHOLHDL, LDLDIRECT in the last 72 hours. Thyroid  Function Tests: No results for input(s): TSH, T4TOTAL, FREET4, T3FREE, THYROIDAB in the last 72 hours. Anemia Panel: No results for input(s): VITAMINB12, FOLATE, FERRITIN, TIBC, IRON, RETICCTPCT in the last 72 hours. Sepsis Labs: Recent Labs  Lab 12/26/23 1112  LATICACIDVEN 2.1*   Recent Results (from the past 240 hours)  Blood culture (routine x 2)     Status: None   Collection Time: 12/19/23  8:11 PM   Specimen: BLOOD  Result Value Ref Range Status   Specimen Description   Final    BLOOD SITE NOT SPECIFIED Performed at [facility], [address], [place], [place] [address]    Special Requests   Final    Blood Culture results may not be optimal due to an inadequate volume of blood received in culture bottles BOTTLES DRAWN AEROBIC AND ANAEROBIC Performed at [facility], [address], [place], [place] [address]     Culture   Final    NO GROWTH 5 DAYS Performed at [facility], [address], [place], [place] [address]    Report Status 12/24/2023 FINAL  Final  Blood culture (routine x 2)     Status: None   Collection Time: 12/19/23  8:11 PM   Specimen: BLOOD  Result Value Ref Range Status   Specimen Description   Final    BLOOD SITE NOT SPECIFIED Performed at [facility], [address], [place], [place] [address]    Special Requests   Final    Blood Culture adequate volume BOTTLES DRAWN AEROBIC AND ANAEROBIC Performed at [facility], [address], [place], [place] [address]    Culture   Final    NO GROWTH 5 DAYS Performed at [facility], [address], [place], [place] [address]    Report Status 12/24/2023 FINAL  Final  Urine Culture     Status: Abnormal   Collection Time: 12/19/23  9:02 PM   Specimen: Urine, Catheterized  Result Value Ref Range Status   Specimen Description   Final    URINE, CATHETERIZED Performed at [facility], [address], [place], [place] [address]    Special Requests   Final    NONE Performed at [facility], [address] [place], [place] [address]    Culture >=100,000 COLONIES/mL YEAST (A)  Final   Report Status 12/20/2023 FINAL  Final  Blood culture (routine x 2)     Status: None (Preliminary result)   Collection Time: 12/26/23 10:44 AM   Specimen: BLOOD  Result Value Ref Range Status   Specimen Description   Final    BLOOD BLOOD LEFT FOREARM Performed at [facility], [address] [place], [place] [address]    Special Requests   Final    BOTTLES DRAWN AEROBIC AND ANAEROBIC Blood Culture results may not be optimal due to an inadequate volume of blood received in culture bottles Performed at [facility], [address] [place], [place] [address]    Culture   Final    NO GROWTH < 24 HOURS Performed at [facility], [address] [place], [place] [address]    Report Status PENDING  Incomplete  Blood culture (routine x 2)     Status: None (Preliminary result)   Collection Time: 12/26/23 10:49 AM   Specimen: BLOOD  Result Value Ref Range Status   Specimen Description   Final    BLOOD BLOOD RIGHT FOREARM Performed at [facility], [address], [place], [place] [address]    Special Requests   Final    BOTTLES DRAWN AEROBIC AND ANAEROBIC Blood Culture results may not be optimal due to an inadequate volume of blood received in culture bottles Performed at [facility], [address], [place], [place] [address]    Culture   Final    NO GROWTH < 24 HOURS Performed at [facility], [address] [place], [place] [address]    Report Status PENDING  Incomplete    Radiology Studies: CT ABDOMEN PELVIS W CONTRAST Result Date: 12/26/2023 CLINICAL DATA:  Abdominal pain, acute, nonlocalized EXAM: CT ABDOMEN AND PELVIS WITH CONTRAST TECHNIQUE: Multidetector CT imaging of the abdomen and pelvis was performed using the standard protocol following bolus administration of intravenous contrast. RADIATION DOSE REDUCTION: This exam was performed according to the departmental dose-optimization program which includes automated exposure control, adjustment of the mA and/or kV according to patient size and/or use of iterative reconstruction technique. CONTRAST:  OMNIPAQUE  IOHEXOL  300 MG/ML  SOLN COMPARISON:  Multiple priors, most recently obtained 12/19/2023 and 12/13/2023. 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 There is mild atelectasis at both  lung bases. No significant pleural effusion or basilar pneumothorax. Hepatobiliary: The liver is normal in density without suspicious focal abnormality. Small calcified gallstones are again noted. The gallbladder remains distended, although demonstrates no wall thickening  or surrounding inflammation. There is no significant biliary dilatation. Pancreas: Mild atrophy. No pancreatic ductal dilatation or surrounding inflammation. Spleen: Scattered calcified granulomas. The spleen is normal in size without other focal abnormality. Adrenals/Urinary Tract: Both adrenal glands appear normal. No evidence of urinary tract calculus, suspicious renal lesion or hydronephrosis. A Foley catheter remains in place. The bladder is decompressed with diffuse wall thickening, similar to previous study. Stomach/Bowel: No enteric contrast administered. Mild distal esophageal wall thickening. The stomach appears unremarkable for its degree of distention. No bowel distension, wall thickening or surrounding inflammation identified. The appendix is tendon of Leigh identified and appears normal. There is mildly prominent stool throughout the colon. Vascular/Lymphatic: There are no enlarged abdominal or pelvic lymph nodes. Mild aortic and branch vessel atherosclerosis. No evidence of aneurysm or large vessel occlusion. Reproductive: Stable mild enlargement of the prostate gland. Other: No evidence of abdominal wall mass or hernia. No ascites or pneumoperitoneum. 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 New mild superior endplate compression deformity at T12 without osseous retropulsion or significant paraspinal soft tissue abnormality. IMPRESSION: 1. No acute intra-abdominal findings or explanation for the patient's symptoms. 2. New mild superior endplate compression deformity at T12 without osseous retropulsion or significant paraspinal soft tissue abnormality. Correlate for point tenderness and recent injury. 3. Cholelithiasis without evidence of cholecystitis or biliary dilatation. 4. Decompressed bladder with persistent diffuse wall thickening. 5. Mildly prominent stool throughout the colon suggesting constipation. 6.  Aortic Atherosclerosis (ICD10-I70.0).  Electronically Signed   By: Elsie Perone M.D.   On: 12/26/2023 12:01   Scheduled Meds:  atorvastatin   80 mg Oral QHS   Chlorhexidine  Gluconate Cloth  6 each Topical Daily   clopidogrel   75 mg Oral Q breakfast   enoxaparin  (LOVENOX ) injection  40 mg Subcutaneous Q24H   escitalopram   10 mg Oral Daily   insulin  aspart  0-5 Units Subcutaneous QHS   insulin  aspart  0-9 Units Subcutaneous TID WC   pantoprazole   40 mg Oral QHS   polyethylene glycol  17 g Oral BID   senna-docusate  1 tablet Oral BID   sodium chloride  flush  3 mL Intravenous Q12H   sodium phosphate   1 enema Rectal Once   Continuous Infusions:  cefTRIAXone  (ROCEPHIN )  IV      LOS: 0 days   Alejandro Marker, DO [facility] Available via Epic secure chat 7am-7pm After these hours, please refer to coverage provider listed on [email or web address] 12/27/2023, 2:28 PM

## 2023-12-27 NOTE — Progress Notes (Signed)
 [id number] called nurse to inform her that pt's heart rate was in the 170s and was showing Vtach. She asked that nurse check on pt. Nurse entered pt's room immediately and noted that the pt was asleep, warm, dryno visible distress. Nurse looked up at monitor at the time and noted rate was in the 80s. Lungs clear bilateral and diminished in bases. Sats 94% room air, heart rate 85, MAP 106, BP 163/83.  To inform NP- JINNY Kipper of the above events.

## 2023-12-27 NOTE — [place] FL2 (Signed)
 [place]  MEDICAID FL2 LEVEL OF CARE FORM     IDENTIFICATION  Patient Name: John [name] Birthdate: 1945/10/24 Sex: male Admission Date (Current Location): 12/26/2023  [facility] and [place] Number:  Producer, television/film/video and Address:  [facility],  [address] [place], [place] [address]      Provider Number: [id number]  Attending Physician Name and Address:  Sherrill Alejandro Donovan, DO  Relative Name and Phone Number:  Rhyder, Koegel (Spouse)  [phone]    Current Level of Care: Hospital Recommended Level of Care: Skilled Nursing Facility Prior Approval Number:    Date Approved/Denied:   PASRR Number: [id number]  Discharge Plan: SNF    Current Diagnoses: Patient Active Problem List   Diagnosis Date Noted   Intractable nausea and vomiting 12/26/2023   Constipation 12/26/2023   AMS (altered mental status) 12/13/2023   Altered mental status 12/09/2023   Nausea & vomiting 12/09/2023   Pacemaker 12/09/2023   Sepsis (HCC) 12/04/2023   CAD S/P percutaneous coronary angioplasty 12/04/2023   HFrEF (heart failure with reduced ejection fraction) (HCC) 08/17/2023   DVT, femoral, chronic (HCC) 08/15/2023   Bladder outlet obstruction 08/15/2023   Sepsis due to Escherichia coli (HCC) 08/15/2023   Debility 08/13/2023   Non-ST elevation (NSTEMI) myocardial infarction (HCC) 08/10/2023   Cholelithiasis 08/07/2023   VTE (venous thromboembolism) 08/07/2023   Lactic acidosis 08/07/2023   Sacral pressure ulcer 08/07/2023   Streptococcal bacteremia 08/07/2023   B12 deficiency 08/07/2023   Iron deficiency anemia 08/07/2023   Severe sepsis (HCC) 08/06/2023   Chronic diastolic CHF (congestive heart failure) (HCC) 08/06/2023   Bacteriuria 08/06/2023   Essential hypertension 06/15/2023   Situational depression 06/15/2023   Protein-calorie malnutrition, severe 05/12/2023   Heart block AV complete (HCC) 05/12/2023   Syncope 05/10/2023   Fall at home, initial encounter 05/08/2023    Hypotension 05/08/2023   Abnormal LFTs 05/08/2023   Anemia 05/08/2023   Coping style affecting medical condition 05/02/2023   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) 04/28/2023   Brainstem infarct, acute (HCC) 04/25/2023   Pressure injury of skin 04/25/2023   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   Acute kidney injury 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) 04/17/2023   DKA (diabetic ketoacidosis) (HCC) 04/16/2023   Type 2 diabetes mellitus with hyperglycemia, with long-term current use of insulin  (HCC) 07/26/2022   Bronchitis 03/20/2021   Acute non-recurrent frontal sinusitis 03/20/2021   Viral URI with cough 01/13/2021   Hyperlipidemia 11/21/2020   Dizziness and giddiness 10/20/2020   Localized swelling of both lower extremities 10/20/2020   Tendinitis of right hip flexor 10/20/2020   Aortic atherosclerosis (HCC) 09/10/2020   Insomnia 09/10/2020   Cognitive impairment 08/18/2020   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   Urinary retention 06/04/2020   History of COVID-19 12/10/2019   Physical deconditioning 12/10/2019   Fatigue 12/10/2019   Unsteady gait 12/10/2019   Pneumonia due to COVID-19 virus 10/26/2019   Peripheral neuropathy 10/17/2019   Depression, major, single episode, 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/2019   History of breast cancer    GERD (gastroesophageal reflux disease)    BPH (benign prostatic hyperplasia)    CTS (carpal tunnel syndrome)    Diabetic polyneuropathy associated with type 2 diabetes mellitus (HCC) 09/29/2019   Neuropathic pain of foot 09/29/2019   Foley catheter in place 01/03/2019   Peripheral sensory neuropathy 05/17/2018   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   Hypomagnesemia 01/18/2018   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) 12/13/2017   Axillary adenopathy 12/09/2017   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   S/P cataract  extraction and insertion of intraocular lens 01/17/2017   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   PVD (posterior vitreous detachment), left 01/07/2017   Hyperopia of both eyes with astigmatism 11/09/2016   Class 1 obesity due to excess calories without serious comorbidity with body mass index (BMI) of 31.0 to 31.9 in adult 09/17/2016   Encounter for monitoring tamoxifen  therapy 03/05/2016   Insulin  dependent type 2 diabetes mellitus (HCC) 07/02/2015   Diabetes 1.5, 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   Palpitation 09/16/2014    Orientation RESPIRATION BLADDER Height & Weight     Self  Normal Incontinent Weight: 190 lb 14.7 oz (86.6 kg) Height:  6' (182.9 cm)  BEHAVIORAL SYMPTOMS/MOOD NEUROLOGICAL BOWEL NUTRITION STATUS      Continent Diet (See dc summary)  AMBULATORY STATUS COMMUNICATION OF NEEDS Skin     Verbally Normal                       Personal Care Assistance Level of Assistance  Bathing, Feeding, Dressing Bathing Assistance: Limited assistance Feeding assistance: Independent Dressing Assistance: Limited assistance     Functional Limitations Info  Speech, Hearing, Sight Sight Info: Impaired Hearing Info: Impaired Speech Info: Adequate    SPECIAL CARE FACTORS FREQUENCY  PT (By licensed PT), OT (By licensed OT)     PT Frequency: 5x/wk OT Frequency: 5x/wk            Contractures Contractures Info: Not present    Additional Factors Info  Code Status, Allergies Code Status Info: DNR Allergies Info: Ramipril  Adhesive (Tape)  Other  Sertraline           Current Medications (12/27/2023):  This is the current hospital active medication list Current Facility-Administered Medications  Medication Dose Route Frequency Provider Last Rate Last Admin   acetaminophen  (TYLENOL ) tablet 650 mg  650 mg Oral Q6H PRN Opyd, Timothy S, MD       Or   acetaminophen  (TYLENOL ) suppository 650 mg  650 mg Rectal Q6H PRN Opyd, Timothy S, MD       atorvastatin   (LIPITOR ) tablet 80 mg  80 mg Oral QHS Opyd, Timothy S, MD   80 mg at 12/26/23 2055   bisacodyl  (DULCOLAX) EC tablet 5 mg  5 mg Oral Daily PRN Opyd, Timothy S, MD       bisacodyl  (DULCOLAX) suppository 10 mg  10 mg Rectal Daily PRN Sherrill, Omair Latif, DO       cefTRIAXone  (ROCEPHIN ) 1 g in sodium chloride  0.9 % 100 mL IVPB  1 g Intravenous Q1400 Sherrill Cable Latif, DO 200 mL/hr at 12/27/23 1550 1 g at 12/27/23 1550   Chlorhexidine  Gluconate Cloth 2 % PADS 6 each  6 each Topical Daily Sheikh, Omair Afton, DO   6 each at 12/27/23 1118   clopidogrel  (PLAVIX ) tablet 75 mg  75 mg Oral Q breakfast Opyd, Timothy S, MD   75 mg at 12/27/23 9161   enoxaparin  (LOVENOX ) injection 40 mg  40 mg Subcutaneous Q24H Opyd, Timothy S, MD   40 mg at 12/26/23 2055   escitalopram  (LEXAPRO ) tablet 10 mg  10 mg Oral Daily Opyd, Timothy S, MD       fentaNYL  (SUBLIMAZE ) injection 25-50 mcg  25-50 mcg Intravenous Q2H PRN Opyd, Timothy S, MD       insulin  aspart (novoLOG )  injection 0-5 Units  0-5 Units Subcutaneous QHS Sheikh, Omair Latif, DO       insulin  aspart (novoLOG ) injection 0-9 Units  0-9 Units Subcutaneous TID WC Sheikh, Omair Latif, DO   2 Units at 12/27/23 1237   labetalol  (NORMODYNE ) injection 10 mg  10 mg Intravenous Q4H PRN Opyd, Timothy S, MD       oxyCODONE  (Oxy IR/ROXICODONE ) immediate release tablet 5 mg  5 mg Oral Q4H PRN Opyd, Timothy S, MD       pantoprazole  (PROTONIX ) EC tablet 40 mg  40 mg Oral QHS Opyd, Timothy S, MD       polyethylene glycol (MIRALAX  / GLYCOLAX ) packet 17 g  17 g Oral BID Sheikh, Omair Latif, DO       prochlorperazine  (COMPAZINE ) injection 5 mg  5 mg Intravenous Q4H PRN Opyd, Timothy S, MD       senna-docusate (Senokot-S) tablet 1 tablet  1 tablet Oral BID Sheikh, Omair Latif, DO       sodium chloride  flush (NS) 0.9 % injection 3 mL  3 mL Intravenous Q12H Opyd, Timothy S, MD   3 mL at 12/27/23 1030   sodium phosphate  (FLEET) enema 1 enema  1 enema Rectal Once Floyd, Dan, DO          Discharge Medications: Please see discharge summary for a list of discharge medications.  Relevant Imaging Results:  Relevant Lab Results:   Additional Information SSN [id number] [place]

## 2023-12-27 NOTE — Evaluation (Signed)
 Physical Therapy Evaluation Patient Details Name: John [name] MRN: [id number] DOB: 1945/06/25 Today's Date: 12/27/2023  History of Present Illness  Pt is 78 yo male admitted on 12/26/23 with nausea and vomiting, possible UTI, and T12 compression deformity.  Pt recently d/c from hospital on 9/11 and doing fairly well until abdominal pain returned and he fell on 9/13.  Pt with hx including but not limited to HTN, HLD, DM2, CVA, cognitive deficits, CAD, CHF, DVT, BPH, urinary retention with foley catheter.  Clinical Impression  Pt admitted with above diagnosis. At baseline, pt ambulatory with RW.  He recently d/c from hospital and was initially doing well but then developed abdominal pain and had a fall.  Pt found to have T 12 compression deformity - pt educated on back precautions for comfort and had improved tolerance for transfers.  Still required assist of 2 for bed mobility and then ambulated 20' with RW but limited by pain.  Pt is below his baseline and will benefit from PT.  At current mobility level recommend Patient will benefit from continued inpatient follow up therapy, <3 hours/day at d/c.  Pt currently with functional limitations due to the deficits listed below (see PT Problem List). Pt will benefit from acute skilled PT to increase their independence and safety with mobility to allow discharge.           If plan is discharge home, recommend the following: A lot of help with walking and/or transfers;A lot of help with bathing/dressing/bathroom;Assistance with cooking/housework;Help with stairs or ramp for entrance   Can travel by private vehicle   Yes    Equipment Recommendations None recommended by PT  Recommendations for Other Services       Functional Status Assessment Patient has had a recent decline in their functional status and demonstrates the ability to make significant improvements in function in a reasonable and predictable amount of time.     Precautions /  Restrictions Precautions Precautions: Fall;Back Precaution Booklet Issued: Yes (comment) (back precaution handout issued) Precaution/Restrictions Comments: back precautions for comfort      Mobility  Bed Mobility Overal bed mobility: Needs Assistance Bed Mobility: Rolling, Sidelying to Sit, Sit to Sidelying Rolling: Min assist Sidelying to sit: Mod assist, +2 for safety/equipment     Sit to sidelying: Mod assist, +2 for safety/equipment General bed mobility comments: Cues for log roll technique; increased time; pt reports much better this method    Transfers Overall transfer level: Needs assistance Equipment used: Rolling walker (2 wheels) Transfers: Sit to/from Stand Sit to Stand: Min assist, From elevated surface           General transfer comment: Cues for hand placement , bed elevated significantly    Ambulation/Gait Ambulation/Gait assistance: Min assist Gait Distance (Feet): 20 Feet Assistive device: Rolling walker (2 wheels) Gait Pattern/deviations: Decreased stride length, Step-through pattern, Trunk flexed Gait velocity: decreased     General Gait Details: Min cues for posture and RW proximity  Stairs            Wheelchair Mobility     Tilt Bed    Modified Rankin (Stroke Patients Only)       Balance Overall balance assessment: Needs assistance Sitting-balance support: No upper extremity supported, Feet supported Sitting balance-Leahy Scale: Fair     Standing balance support: Reliant on assistive device for balance, Bilateral upper extremity supported Standing balance-Leahy Scale: Poor Standing balance comment: required RW  Pertinent Vitals/Pain Pain Assessment Pain Assessment: 0-10 Pain Score: 2  (1-2 rest; 4-5 movment) Pain Location: back Pain Descriptors / Indicators: Discomfort Pain Intervention(s): Limited activity within patient's tolerance, Monitored during session, Repositioned     Home Living Family/patient expects to be discharged to:: Skilled nursing facility (Pt from home - seeking placement) Living Arrangements: Spouse/significant other Available Help at Discharge: Family;Available 24 hours/day Type of Home: House Home Access: Stairs to enter Entrance Stairs-Rails: Right;Left;Can reach both Entrance Stairs-Number of Steps: 3 Alternate Level Stairs-Number of Steps: 14 Home Layout: Two level;Able to live on main level with bedroom/bathroom Home Equipment: Rolling Walker (2 wheels);BSC/3in1;Shower seat;Lift chair      Prior Function Prior Level of Function : Needs assist;History of Falls (last six months)             Mobility Comments: Uses RW, was walking 100-300' during last 2 admissions; after going home got weaker and had some falls ADLs Comments: Independent with self-feeding and grooming. Pt reports bathing, dressing, and toileting with supervision from wife for safety. Pt also reports use of shower chair. Pt's wife assists with IADLs. Pt is a retired [employer] transport manager.     Extremity/Trunk Assessment   Upper Extremity Assessment Upper Extremity Assessment: Generalized weakness    Lower Extremity Assessment Lower Extremity Assessment: Generalized weakness    Cervical / Trunk Assessment Cervical / Trunk Assessment: Other exceptions Cervical / Trunk Exceptions: Pt found to have T 12 compression deformity without  osseous retropulsion or significant paraspinal soft tissue  abnormality  Communication   Communication Factors Affecting Communication: Hearing impaired    Cognition Arousal: Alert Behavior During Therapy: WFL for tasks assessed/performed   PT - Cognitive impairments: History of cognitive impairments                       PT - Cognition Comments: Pt oriented x 4; follows basic commands; some cues for sequencing/problem solving Following commands: Impaired Following commands impaired: Only follows one step  commands consistently     Cueing Cueing Techniques: Verbal cues, Tactile cues, Gestural cues     General Comments      Exercises     Assessment/Plan    PT Assessment Patient needs continued PT services  PT Problem List Decreased strength;Decreased cognition;Decreased knowledge of use of DME;Decreased activity tolerance;Decreased balance;Decreased mobility;Decreased knowledge of precautions;Pain       PT Treatment Interventions DME instruction;Gait training;Cognitive remediation;Patient/family education;Functional mobility training;Therapeutic activities;Therapeutic exercise;Balance training;Modalities;Stair training    PT Goals (Current goals can be found in the Care Plan section)  Acute Rehab PT Goals Patient Stated Goal: return home PT Goal Formulation: With patient Time For Goal Achievement: 01/10/24 Potential to Achieve Goals: Good    Frequency Min 2X/week     Co-evaluation               AM-PAC PT 6 Clicks Mobility  Outcome Measure Help needed turning from your back to your side while in a flat bed without using bedrails?: A Little Help needed moving from lying on your back to sitting on the side of a flat bed without using bedrails?: A Lot Help needed moving to and from a bed to a chair (including a wheelchair)?: A Lot Help needed standing up from a chair using your arms (e.g., wheelchair or bedside chair)?: A Lot Help needed to walk in hospital room?: A Lot Help needed climbing 3-5 steps with a railing? : Total 6 Click Score: 12    End of  Session Equipment Utilized During Treatment: Gait belt Activity Tolerance: Patient tolerated treatment well Patient left: in bed;with bed alarm set;with call bell/phone within reach Nurse Communication: Mobility status PT Visit Diagnosis: Unsteadiness on feet (R26.81);Difficulty in walking, not elsewhere classified (R26.2)    Time: 1430-1456 PT Time Calculation (min) (ACUTE ONLY): 26 min   Charges:   PT  Evaluation $PT Eval Moderate Complexity: 1 Mod PT Treatments $Therapeutic Activity: 8-22 mins PT General Charges $$ ACUTE PT VISIT: 1 Visit         Benjiman, PT Acute Rehab [facility] [phone]   Benjiman VEAR Mulberry 12/27/2023, 3:11 PM

## 2023-12-27 NOTE — Hospital Course (Addendum)
 John [name] is a 78 y.o. male with medical history significant for hypertension, hyperlipidemia, type 2 diabetes mellitus, history of CVA, cognitive deficits, CAD, chronic HFrEF, provoked DVT no longer anticoagulated, BPH, urinary retention with Foley catheter, and recent admissions with UTI, bacteremia, and pneumonia who now returns with abdominal pain, nausea, and vomiting.   Patient returned home from the hospital on 12/22/2023, had been doing fairly well initially, but then developed recurrent abdominal pain with nausea and nonbloody vomiting last night.  Since then, he has continued to grimace while gesturing to his periumbilical abdomen.  He has also been vomiting repeatedly and unable to tolerate anything by mouth.  His wife does not believe that he has moved his bowels since he returned home.  She does not feel that she can adequately care for him and inquires about nursing home placement.  Nausea and Vomiting improved with the diet and been advanced and will continue to treat his constipation. PT/OT recommending SNF and TOC assisting w/ Disposition as SNF has been selected and they can accept the patient on Monday 01/02/24. He is medically stable for D/C at this time and awaiting transfer to SNF.   Assessment and Plan:  Nausea and Vomiting: Improved. Exam is benign and there No acute intra-abdominal findings or explanation for the patient's symptoms on CT Imaging.  CT imaging did show cholelithiasis without evidence of cholecystitis or biliary dilatation and there was mildly prominent stool throughout the colon suggesting constipation. IVF now stopped. Treating Constipation as below and will continue to Monitor Electrolytes. 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 Urinalysis was done and showed cloudy appearance with greater than 500 glucose, small hemoglobin, 5  ketones, large leukocytes, negative nitrites, rare bacteria and greater than 50 RBCs per high-power field and greater than 50 WBCs.  Blood cultures x 2 showed NGTD @ 3 Days; Urine culture showing 50,000 CFU of Yeast.  CT scan showed decompressed bladder with persistent diffuse wall thickening. Had Initiated IV Ceftriaxone  while awaiting Cx Results but suspect colonization and Funguria/ Asymptomatic Candiuria that is asymptomatic so will stop Abx. WBC went from 11.7 -> 9.3 -> 8.9 -> 7.3 on last check. Changed Foley Catheter this Admission.   T12 Compression Deformity: CT Scan done and showed new mild superior endplate compression deformity at T12 without osseous retropulsion or significant paraspinal soft tissue abnormality. Pain control and  Heating pad. Need PT/OT to Evaluate and Treat and they are recommending SNF. He is medically stable for D/C as he has no real complaints today of pain   Hypokalemia: K+ is now 3.6 on last check. CTM and Replete as Necessary. Repeat CMP within 1 week  Hypomagnesemia: Mag Level is now 1.9 on last check. CTM and Replete as Necessary. Repeat Mag Level within 1 week   Chronic Systolic CHF: Appeared hypovolemic on admission and therapies had been limited by hypotension. Continue to hold Furosemide . Was cautiously hydrated w/ IVF but now stopped as he has improved. Strict I's and O's and Daily Weights.  Intake/Output Summary (Last 24 hours) at 01/01/2024 1403 Last data filed at 01/01/2024 9052 Gross per 24 hour  Intake 477 ml  Output 350 ml  Net 127 ml  -CTM for S/Sx of Volume Overload; appears euvolemic at this time and can resume diuretics at D/C   Diabetes Mellitus Type 2: A1c was 7.5% in April 2025;  Changed Very Sensitive Novolog  SSI q4h to Sensitive Novolog  SSI AC/HS but now will go to Moderate Scale. CTM CBGs per protocol. CBG Trend ranging from 194-285 on the last 7 checks. Diabetes Education Coordinator consulted and the patient was started on Semglee  10 units  every day and will increase to 15 units daily   CAD: Currently has No anginal symptoms; C/w Clopidogrel  75 mg po Dialy and Atorvastatin  80 mg po qHS  HLD: C/w Atorvastatin  80 m po qHS   Depression: C/w Esctialopram 10 mg po Daily   Hyponatremia: Mild and Improved. Na+ went from 134 -> 137 -> 135 -> 136 on last check. CTM and Trend and repeat CMP within 1 week   Breast Cancer: Follows with [facility] and is s/p mastectomy and chemotherapy; stopped Tamoxifen  early due to PE    Hx of VTE:  Was considered provoked and he completed treatment    Cognitive Impairment: C/w Delirium Precautions   Constipation: Changed Bowel Regimen to Senna-Docusate 1 tab po BID, Miralax  17 grams po BID, and add Bisacodyl  10 mg Rcprn. Checked KUB on 9/18 and showed Diminished colonic stool from recent CT. Small volume of formed stool persists. No small bowel distension or evidence of obstruction. Low pelvis including rectum not included in the field of view. Gallstones on CT are not well seen by radiograph. Multiple pelvic phleboliths.  Continue with bowel regimen at facility; Received a Fleet Enema 9/20 and had a very large Bowel movement.   Hyperbilirubinemia: Mild. T Bili is now 2.0 on last check. CTM and Trend and repeat CMP within 1 week  Normocytic Anemia: Hgb/Hct Trend Stable:  Recent Labs  Lab 12/22/23 0459 12/26/23 1040 12/26/23 1112 12/27/23 0458 12/28/23 0526 12/29/23 0535 12/30/23 0506  HGB 11.5* 12.6* 13.3 11.8* 11.8* 12.2* 12.9*  HCT 35.1* 39.1 39.0 36.5* 35.6* 37.9* 39.4  MCV 88.2 86.7  --  86.9 86.4 86.5 86.2  -Checked Anemia Panel showed an iron level 39, UIBC 2 1, TIBC 239, saturation ratio 60%, ferritin 157, folate level of 13.9. CTM for S/Sx of Bleeding; No overt bleeding noted. Repeat CBC within 1 week  Overweight: Complicates overall prognosis and care. Estimated body mass index is 24.49 kg/m as calculated from the following:   Height as of this encounter: 6' (1.829 m).   Weight  as of this encounter: 81.9 kg. Weight Loss and Dietary Counseling given

## 2023-12-27 NOTE — TOC Initial Note (Signed)
 Transition of Care [facility]) - Initial/Assessment Note    Patient Details  Name: John [name] MRN: [id number] Date of Birth: 22-Mar-1946  Transition of Care [facility]) CM/SW Contact:    Sheri ONEIDA Sharps, LCSW Phone Number: 12/27/2023, 4:45 PM  Clinical Narrative:                 Pt from home w/ spouse. Pt recommended for SNF. CSW met w/ spouse at bedside; spouse prefers [facility]. Spouse states there are very few SNF she would consider. 1st choice is [facility]. PASRR obtained and FL2 completed. SNF ref faxed awaiting bed offers.   Expected Discharge Plan: Skilled Nursing Facility Barriers to Discharge: Continued Medical Work up   Patient Goals and CMS Choice Patient states their goals for this hospitalization and ongoing recovery are:: return home following STR CMS Medicare.gov Compare Post Acute Care list provided to:: Other (Comment Required) (spouse) Choice offered to / list presented to : Spouse [place] ownership interest in [facility].provided to:: Spouse    Expected Discharge Plan and Services     Post Acute Care Choice: Skilled Nursing Facility Living arrangements for the past 2 months: Single Family Home                 DME Arranged: N/A DME Agency: NA       HH Arranged: NA HH Agency: NA        Prior Living Arrangements/Services Living arrangements for the past 2 months: Single Family Home Lives with:: Spouse Patient language and need for interpreter reviewed:: Yes Do you feel safe going back to the place where you live?: Yes      Need for Family Participation in Patient Care: Yes (Comment) Care giver support system in place?: Yes (comment)   Criminal Activity/Legal Involvement Pertinent to Current Situation/Hospitalization: Yes - Comment as needed  Activities of Daily Living   ADL Screening (condition at time of admission) Independently performs ADLs?: No Does the patient have a NEW difficulty with bathing/dressing/toileting/self-feeding that is  expected to last >3 days?: Yes (Initiates electronic notice to provider for possible OT consult) Does the patient have a NEW difficulty with getting in/out of bed, walking, or climbing stairs that is expected to last >3 days?: Yes (Initiates electronic notice to provider for possible PT consult) Does the patient have a NEW difficulty with communication that is expected to last >3 days?: No Is the patient deaf or have difficulty hearing?: Yes Does the patient have difficulty seeing, even when wearing glasses/contacts?: No Does the patient have difficulty concentrating, remembering, or making decisions?: Yes  Permission Sought/Granted                  Emotional Assessment Appearance:: Appears stated age Attitude/Demeanor/Rapport: Engaged Affect (typically observed): Accepting Orientation: : Oriented to Self Alcohol / Substance Use: Not Applicable Psych Involvement: No (comment)  Admission diagnosis:  Generalized abdominal pain [R10.84] Intractable nausea and vomiting [R11.2] Patient Active Problem List   Diagnosis Date Noted   Intractable nausea and vomiting 12/26/2023   Constipation 12/26/2023   AMS (altered mental status) 12/13/2023   Altered mental status 12/09/2023   Nausea & vomiting 12/09/2023   Pacemaker 12/09/2023   Sepsis (HCC) 12/04/2023   CAD S/P percutaneous coronary angioplasty 12/04/2023   HFrEF (heart failure with reduced ejection fraction) (HCC) 08/17/2023   DVT, femoral, chronic (HCC) 08/15/2023   Bladder outlet obstruction 08/15/2023   Sepsis due to Escherichia coli (HCC) 08/15/2023   Debility 08/13/2023   Non-ST elevation (NSTEMI)  myocardial infarction (HCC) 08/10/2023   Cholelithiasis 08/07/2023   VTE (venous thromboembolism) 08/07/2023   Lactic acidosis 08/07/2023   Sacral pressure ulcer 08/07/2023   Streptococcal bacteremia 08/07/2023   B12 deficiency 08/07/2023   Iron deficiency anemia 08/07/2023   Severe sepsis (HCC) 08/06/2023   Chronic  diastolic CHF (congestive heart failure) (HCC) 08/06/2023   Bacteriuria 08/06/2023   Essential hypertension 06/15/2023   Situational depression 06/15/2023   Protein-calorie malnutrition, severe 05/12/2023   Heart block AV complete (HCC) 05/12/2023   Syncope 05/10/2023   Fall at home, initial encounter 05/08/2023   Hypotension 05/08/2023   Abnormal LFTs 05/08/2023   Anemia 05/08/2023   Coping style affecting medical condition 05/02/2023   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) 04/28/2023   Brainstem infarct, acute (HCC) 04/25/2023   Pressure injury of skin 04/25/2023   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   Acute kidney injury 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) 04/17/2023   DKA (diabetic ketoacidosis) (HCC) 04/16/2023   Type 2 diabetes mellitus with hyperglycemia, with long-term current use of insulin  (HCC) 07/26/2022   Bronchitis 03/20/2021   Acute non-recurrent frontal sinusitis 03/20/2021   Viral URI with cough 01/13/2021   Hyperlipidemia 11/21/2020   Dizziness and giddiness 10/20/2020   Localized swelling of both lower extremities 10/20/2020   Tendinitis of right hip flexor 10/20/2020   Aortic atherosclerosis (HCC) 09/10/2020   Insomnia 09/10/2020   Cognitive impairment 08/18/2020   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   Urinary retention 06/04/2020   History of COVID-19 12/10/2019   Physical deconditioning 12/10/2019   Fatigue 12/10/2019   Unsteady gait 12/10/2019   Pneumonia due to COVID-19 virus 10/26/2019   Peripheral neuropathy 10/17/2019   Depression, major, single episode, 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/2019   History of breast cancer    GERD (gastroesophageal reflux disease)    BPH (benign prostatic hyperplasia)    CTS (carpal tunnel syndrome)    Diabetic polyneuropathy associated with type 2 diabetes mellitus (HCC) 09/29/2019   Neuropathic pain of foot 09/29/2019   Foley catheter in place 01/03/2019    Peripheral sensory neuropathy 05/17/2018   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   Hypomagnesemia 01/18/2018   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) 12/13/2017   Axillary adenopathy 12/09/2017   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   S/P cataract extraction and insertion of intraocular lens 01/17/2017   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   PVD (posterior vitreous detachment), left 01/07/2017   Hyperopia of both eyes with astigmatism 11/09/2016   Class 1 obesity due to excess calories without serious comorbidity with body mass index (BMI) of 31.0 to 31.9 in adult 09/17/2016   Encounter for monitoring tamoxifen  therapy 03/05/2016   Insulin  dependent type 2 diabetes mellitus (HCC) 07/02/2015   Diabetes 1.5, 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 [place] [address] Phone: [phone] Fax: [phone]     Social Drivers of Health (SDOH) Social History: SDOH Screenings   Food Insecurity: No Food Insecurity (12/27/2023)  Housing: Low Risk  (12/27/2023)  Transportation Needs: No Transportation Needs (12/27/2023)  Utilities: Not At Risk (12/27/2023)  Alcohol Screen: Low Risk  (01/27/2023)  Depression (PHQ2-9): Low  Risk  (01/28/2023)  Financial Resource Strain: Low Risk  (10/03/2023)  Physical Activity: Inactive (10/03/2023)  Social Connections: Unknown (12/27/2023)  Recent Concern: Social Connections - Socially Isolated (12/05/2023)  Stress: No Stress Concern Present (10/03/2023)  Tobacco Use: Low Risk  (12/26/2023)   SDOH Interventions:     Readmission Risk Interventions    12/06/2023    4:03 PM 05/11/2023   12:52 PM  04/22/2023   12:12 PM  Readmission Risk Prevention Plan  Medication Screening   Complete  Transportation Screening Complete Complete Complete  PCP or Specialist Appt within 3-5 Days  Complete   HRI or Home Care Consult  Complete   Social Work Consult for Recovery Care Planning/Counseling  Complete   Palliative Care Screening  Not Applicable   Medication Review Oceanographer) Complete Complete   HRI or Home Care Consult Complete    SW Recovery Care/Counseling Consult Complete    Palliative Care Screening Not Applicable    Skilled Nursing Facility Not Applicable

## 2023-12-27 NOTE — Care Management Obs Status (Signed)
 MEDICARE OBSERVATION STATUS NOTIFICATION   Patient Details  Name: John [name] MRN: [id number] Date of Birth: 1945-08-06   Medicare Observation Status Notification Given:  Yes    Sheri ONEIDA Sharps, LCSW 12/27/2023, 3:52 PM

## 2023-12-27 NOTE — Progress Notes (Signed)
 No orders received from JINNY Kipper, NP

## 2023-12-28 DIAGNOSIS — R339 Retention of urine, unspecified: Secondary | ICD-10-CM | POA: Diagnosis not present

## 2023-12-28 DIAGNOSIS — R4189 Other symptoms and signs involving cognitive functions and awareness: Secondary | ICD-10-CM | POA: Diagnosis not present

## 2023-12-28 DIAGNOSIS — L89892 Pressure ulcer of other site, stage 2: Secondary | ICD-10-CM | POA: Diagnosis not present

## 2023-12-28 DIAGNOSIS — I2510 Atherosclerotic heart disease of native coronary artery without angina pectoris: Secondary | ICD-10-CM | POA: Diagnosis not present

## 2023-12-28 DIAGNOSIS — R112 Nausea with vomiting, unspecified: Secondary | ICD-10-CM | POA: Diagnosis not present

## 2023-12-28 LAB — CBC WITH DIFFERENTIAL/PLATELET
Abs Immature Granulocytes: 0.05 K/uL (ref 0.00–0.07)
Basophils Absolute: 0 K/uL (ref 0.0–0.1)
Basophils Relative: 1 %
Eosinophils Absolute: 0.2 K/uL (ref 0.0–0.5)
Eosinophils Relative: 3 %
HCT: 35.6 % — ABNORMAL LOW (ref 39.0–52.0)
Hemoglobin: 11.8 g/dL — ABNORMAL LOW (ref 13.0–17.0)
Immature Granulocytes: 1 %
Lymphocytes Relative: 21 %
Lymphs Abs: 1.8 K/uL (ref 0.7–4.0)
MCH: 28.6 pg (ref 26.0–34.0)
MCHC: 33.1 g/dL (ref 30.0–36.0)
MCV: 86.4 fL (ref 80.0–100.0)
Monocytes Absolute: 0.8 K/uL (ref 0.1–1.0)
Monocytes Relative: 9 %
Neutro Abs: 6 K/uL (ref 1.7–7.7)
Neutrophils Relative %: 65 %
Platelets: 230 K/uL (ref 150–400)
RBC: 4.12 MIL/uL — ABNORMAL LOW (ref 4.22–5.81)
RDW: 19 % — ABNORMAL HIGH (ref 11.5–15.5)
WBC: 8.9 K/uL (ref 4.0–10.5)
nRBC: 0 % (ref 0.0–0.2)

## 2023-12-28 LAB — COMPREHENSIVE METABOLIC PANEL WITH GFR
ALT: 16 U/L (ref 0–44)
AST: 15 U/L (ref 15–41)
Albumin: 3.5 g/dL (ref 3.5–5.0)
Alkaline Phosphatase: 69 U/L (ref 38–126)
Anion gap: 14 (ref 5–15)
BUN: 11 mg/dL (ref 8–23)
CO2: 25 mmol/L (ref 22–32)
Calcium: 9.3 mg/dL (ref 8.9–10.3)
Chloride: 96 mmol/L — ABNORMAL LOW (ref 98–111)
Creatinine, Ser: 0.64 mg/dL (ref 0.61–1.24)
GFR, Estimated: >60 mL/min (ref >60)
Glucose, Bld: 217 mg/dL — ABNORMAL HIGH (ref 70–99)
Potassium: 3.3 mmol/L — ABNORMAL LOW (ref 3.5–5.1)
Sodium: 135 mmol/L (ref 135–145)
Total Bilirubin: 2.1 mg/dL — ABNORMAL HIGH (ref 0.0–1.2)
Total Protein: 5.5 g/dL — ABNORMAL LOW (ref 6.5–8.1)

## 2023-12-28 LAB — MAGNESIUM: Magnesium: 1.8 mg/dL (ref 1.7–2.4)

## 2023-12-28 LAB — GLUCOSE, CAPILLARY
Glucose-Capillary: 212 mg/dL — ABNORMAL HIGH (ref 70–99)
Glucose-Capillary: 270 mg/dL — ABNORMAL HIGH (ref 70–99)
Glucose-Capillary: 195 mg/dL — ABNORMAL HIGH (ref 70–99)
Glucose-Capillary: 264 mg/dL — ABNORMAL HIGH (ref 70–99)

## 2023-12-28 LAB — VITAMIN B12: Vitamin B-12: 1143 pg/mL — ABNORMAL HIGH (ref 180–914)

## 2023-12-28 LAB — PHOSPHORUS: Phosphorus: 2.8 mg/dL (ref 2.5–4.6)

## 2023-12-28 MED ORDER — POTASSIUM CHLORIDE CRYS ER 20 MEQ PO TBCR
40.0000 meq | EXTENDED_RELEASE_TABLET | Freq: Two times a day (BID) | ORAL | Status: AC
  Administered 2023-12-28 (×2): 40 meq via ORAL
  Filled 2023-12-28 (×2): qty 2

## 2023-12-28 MED ORDER — INSULIN GLARGINE 100 UNIT/ML ~~LOC~~ SOLN
10.0000 [IU] | SUBCUTANEOUS | Status: DC
  Administered 2023-12-28 – 2023-12-31 (×4): 10 [IU] via SUBCUTANEOUS
  Filled 2023-12-28 (×5): qty 0.1

## 2023-12-28 MED ORDER — INSULIN GLARGINE-YFGN 100 UNIT/ML ~~LOC~~ SOPN: 10.0000 [IU] | PEN_INJECTOR | SUBCUTANEOUS | Status: DC

## 2023-12-28 MED ORDER — MAGNESIUM SULFATE 2 GM/50ML IV SOLN
2.0000 g | Freq: Once | INTRAVENOUS | Status: AC
  Administered 2023-12-28: 2 g via INTRAVENOUS
  Filled 2023-12-28: qty 50

## 2023-12-28 NOTE — Plan of Care (Signed)
 Problem: Education: Goal: Knowledge of General Education information will improve Description: Including pain rating scale, medication(s)/side effects and non-pharmacologic comfort measures 12/28/2023 0403 by Oral Cena BRAVO, RN Outcome: Progressing 12/28/2023 0403 by Oral Cena BRAVO, RN Outcome: Progressing   Problem: Health Behavior/Discharge Planning: Goal: Ability to manage health-related needs will improve 12/28/2023 0403 by Oral Cena BRAVO, RN Outcome: Progressing 12/28/2023 0403 by Oral Cena BRAVO, RN Outcome: Progressing   Problem: Clinical Measurements: Goal: Ability to maintain clinical measurements within normal limits will improve 12/28/2023 0403 by Oral Cena BRAVO, RN Outcome: Progressing 12/28/2023 0403 by Oral Cena BRAVO, RN Outcome: Progressing Goal: Will remain free from infection 12/28/2023 0403 by Oral Cena BRAVO, RN Outcome: Progressing 12/28/2023 0403 by Oral Cena BRAVO, RN Outcome: Progressing Goal: Diagnostic test results will improve 12/28/2023 0403 by Oral Cena BRAVO, RN Outcome: Progressing 12/28/2023 0403 by Oral Cena BRAVO, RN Outcome: Progressing Goal: Respiratory complications will improve 12/28/2023 0403 by Oral Cena BRAVO, RN Outcome: Progressing 12/28/2023 0403 by Oral Cena BRAVO, RN Outcome: Progressing Goal: Cardiovascular complication will be avoided 12/28/2023 0403 by Oral Cena BRAVO, RN Outcome: Progressing 12/28/2023 0403 by Oral Cena BRAVO, RN Outcome: Progressing   Problem: Activity: Goal: Risk for activity intolerance will decrease 12/28/2023 0403 by Oral Cena BRAVO, RN Outcome: Progressing 12/28/2023 0403 by Oral Cena BRAVO, RN Outcome: Progressing   Problem: Nutrition: Goal: Adequate nutrition will be maintained 12/28/2023 0403 by Oral Cena BRAVO, RN Outcome: Progressing 12/28/2023 0403 by Oral Cena BRAVO, RN Outcome: Progressing   Problem: Coping: Goal: Level  of anxiety will decrease 12/28/2023 0403 by Oral Cena BRAVO, RN Outcome: Progressing 12/28/2023 0403 by Oral Cena BRAVO, RN Outcome: Progressing   Problem: Elimination: Goal: Will not experience complications related to bowel motility 12/28/2023 0403 by Oral Cena BRAVO, RN Outcome: Progressing 12/28/2023 0403 by Oral Cena BRAVO, RN Outcome: Progressing Goal: Will not experience complications related to urinary retention 12/28/2023 0403 by Oral Cena BRAVO, RN Outcome: Progressing 12/28/2023 0403 by Oral Cena BRAVO, RN Outcome: Progressing   Problem: Pain Managment: Goal: General experience of comfort will improve and/or be controlled 12/28/2023 0403 by Oral Cena BRAVO, RN Outcome: Progressing 12/28/2023 0403 by Oral Cena BRAVO, RN Outcome: Progressing   Problem: Safety: Goal: Ability to remain free from injury will improve 12/28/2023 0403 by Oral Cena BRAVO, RN Outcome: Progressing 12/28/2023 0403 by Oral Cena BRAVO, RN Outcome: Progressing   Problem: Skin Integrity: Goal: Risk for impaired skin integrity will decrease 12/28/2023 0403 by Oral Cena BRAVO, RN Outcome: Progressing 12/28/2023 0403 by Oral Cena BRAVO, RN Outcome: Progressing   Problem: Education: Goal: Ability to describe self-care measures that may prevent or decrease complications (Diabetes Survival Skills Education) will improve 12/28/2023 0403 by Oral Cena BRAVO, RN Outcome: Progressing 12/28/2023 0403 by Oral Cena BRAVO, RN Outcome: Progressing Goal: Individualized Educational Video(s) 12/28/2023 0403 by Oral Cena BRAVO, RN Outcome: Progressing 12/28/2023 0403 by Oral Cena BRAVO, RN Outcome: Progressing   Problem: Coping: Goal: Ability to adjust to condition or change in health will improve 12/28/2023 0403 by Oral Cena BRAVO, RN Outcome: Progressing 12/28/2023 0403 by Oral Cena BRAVO, RN Outcome: Progressing   Problem: Fluid Volume: Goal:  Ability to maintain a balanced intake and output will improve 12/28/2023 0403 by Oral Cena BRAVO, RN Outcome: Progressing 12/28/2023 0403 by Oral Cena BRAVO, RN Outcome: Progressing   Problem: Health Behavior/Discharge Planning: Goal: Ability to identify and utilize available resources and services will improve 12/28/2023 0403 by Oral Cena BRAVO, RN Outcome: Progressing 12/28/2023 0403 by  Oral Cena BRAVO, RN Outcome: Progressing Goal: Ability to manage health-related needs will improve 12/28/2023 0403 by Oral Cena BRAVO, RN Outcome: Progressing 12/28/2023 0403 by Oral Cena BRAVO, RN Outcome: Progressing   Problem: Metabolic: Goal: Ability to maintain appropriate glucose levels will improve 12/28/2023 0403 by Oral Cena BRAVO, RN Outcome: Progressing 12/28/2023 0403 by Oral Cena BRAVO, RN Outcome: Progressing   Problem: Nutritional: Goal: Maintenance of adequate nutrition will improve 12/28/2023 0403 by Oral Cena BRAVO, RN Outcome: Progressing 12/28/2023 0403 by Oral Cena BRAVO, RN Outcome: Progressing Goal: Progress toward achieving an optimal weight will improve 12/28/2023 0403 by Oral Cena BRAVO, RN Outcome: Progressing 12/28/2023 0403 by Oral Cena BRAVO, RN Outcome: Progressing   Problem: Skin Integrity: Goal: Risk for impaired skin integrity will decrease 12/28/2023 0403 by Oral Cena BRAVO, RN Outcome: Progressing 12/28/2023 0403 by Oral Cena BRAVO, RN Outcome: Progressing   Problem: Tissue Perfusion: Goal: Adequacy of tissue perfusion will improve 12/28/2023 0403 by Oral Cena BRAVO, RN Outcome: Progressing 12/28/2023 0403 by Oral Cena BRAVO, RN Outcome: Progressing

## 2023-12-28 NOTE — Evaluation (Signed)
 Occupational Therapy Evaluation Patient Details Name: John [name] MRN: [id number] DOB: [birth year] Today's Date: 12/28/2023   History of Present Illness   Pt is 78 yo male admitted on 12/26/23 with nausea and vomiting, possible UTI, and T12 compression deformity.  Pt recently d/c from hospital on 9/11 and doing fairly well until abdominal pain returned and he fell on 9/13.  Pt with hx including but not limited to HTN, HLD, DM2, CVA, cognitive deficits, CAD, CHF, DVT, BPH, urinary retention with foley catheter.     Clinical Impressions Pt admitted with the above diagnosis and has the deficits outlined below. Pt would benefit from cont OT to increase independence with basic adls and adl transfers and decrease fall risk with all adls. Pt lives with wife but is requiring more assist than she can handle at this point. Pt was requiring some assist with adls PTA in areas of bathing and dressing but was able to mobilize on his own. Due to pain and compression fx, pt not mobilizing as well. Would rec <3 hours of therapy a day prior to going home to achieve goals. Will continue to see with focus on mobility during adls and LE adls.     If plan is discharge home, recommend the following:   A little help with walking and/or transfers;A lot of help with bathing/dressing/bathroom;Assistance with cooking/housework;Direct supervision/assist for medications management;Direct supervision/assist for financial management;Assist for transportation;Help with stairs or ramp for entrance;Supervision due to cognitive status     Functional Status Assessment   Patient has had a recent decline in their functional status and demonstrates the ability to make significant improvements in function in a reasonable and predictable amount of time.     Equipment Recommendations   None recommended by OT     Recommendations for Other Services         Precautions/Restrictions   Precautions Precautions:  Fall;Back Precaution Booklet Issued: Yes (comment) Recall of Precautions/Restrictions: Impaired Precaution/Restrictions Comments: back precautions for comfort Restrictions Weight Bearing Restrictions Per Provider Order: No     Mobility Bed Mobility Overal bed mobility: Needs Assistance Bed Mobility: Rolling, Sidelying to Sit Rolling: Min assist Sidelying to sit: Mod assist       General bed mobility comments: Cues for log roll technique; increased time; pt reports much better this method    Transfers Overall transfer level: Needs assistance Equipment used: Rolling walker (2 wheels) Transfers: Sit to/from Stand, Bed to chair/wheelchair/BSC Sit to Stand: Min assist, From elevated surface     Step pivot transfers: Min assist     General transfer comment: Cues for hand placement , bed elevated significantly      Balance Overall balance assessment: Needs assistance Sitting-balance support: No upper extremity supported, Feet supported Sitting balance-Leahy Scale: Good     Standing balance support: Reliant on assistive device for balance, Bilateral upper extremity supported Standing balance-Leahy Scale: Poor Standing balance comment: required RW                           ADL either performed or assessed with clinical judgement   ADL Overall ADL's : Needs assistance/impaired Eating/Feeding: Set up;Sitting   Grooming: Wash/dry hands;Wash/dry face;Oral care;Set up;Sitting Grooming Details (indicate cue type and reason): in chair Upper Body Bathing: Set up;Sitting   Lower Body Bathing: Maximal assistance;Sit to/from stand;Cueing for compensatory techniques Lower Body Bathing Details (indicate cue type and reason): assist to get below knees in sitting due to pain and assist to  stand from low surfaces. Upper Body Dressing : Minimal assistance;Sitting   Lower Body Dressing: Maximal assistance;Sit to/from stand;Cueing for compensatory techniques Lower Body  Dressing Details (indicate cue type and reason): pt limited by pain Toilet Transfer: Moderate assistance;BSC/3in1;Stand-pivot;Rolling walker (2 wheels) Toilet Transfer Details (indicate cue type and reason): from higher surface pt is min assist but from normal surface requires mod assist. Toileting- Clothing Manipulation and Hygiene: Total assistance;Sit to/from stand Toileting - Clothing Manipulation Details (indicate cue type and reason): Pt stood while therapist cleaned pt.     Functional mobility during ADLs: Minimal assistance;Rolling walker (2 wheels) General ADL Comments: Pt limited mostly due to pain in lower back. Pt may benefit from adaptive equipment to assist in independence but unsure he would use it.     Vision Baseline Vision/History: 0 No visual deficits Ability to See in Adequate Light: 0 Adequate Patient Visual Report: No change from baseline Vision Assessment?: No apparent visual deficits     Perception         Praxis Praxis: WFL       Pertinent Vitals/Pain Pain Assessment Pain Assessment: 0-10 Pain Score: 5  Pain Location: back Pain Descriptors / Indicators: Discomfort Pain Intervention(s): Limited activity within patient's tolerance, Monitored during session, Patient requesting pain meds-RN notified, RN gave pain meds during session, Repositioned     Extremity/Trunk Assessment Upper Extremity Assessment Upper Extremity Assessment: Overall WFL for tasks assessed   Lower Extremity Assessment Lower Extremity Assessment: Defer to PT evaluation   Cervical / Trunk Assessment Cervical / Trunk Assessment: Other exceptions Cervical / Trunk Exceptions: Pt found to have T 12 compression deformity without  osseous retropulsion or significant paraspinal soft tissue  abnormality   Communication Communication Communication: Impaired Factors Affecting Communication: Hearing impaired   Cognition Arousal: Alert Behavior During Therapy: WFL for tasks  assessed/performed Cognition: History of cognitive impairments             OT - Cognition Comments: Pt limited with problem solving, STM, insight and attention span                 Following commands: Impaired Following commands impaired: Only follows one step commands consistently     Cueing  General Comments   Cueing Techniques: Verbal cues;Tactile cues;Gestural cues  Pt requires cues for safety and hand placement. Impulsive at times.   Exercises     Shoulder Instructions      Home Living Family/patient expects to be discharged to:: Skilled nursing facility Living Arrangements: Spouse/significant other Available Help at Discharge: Family;Available 24 hours/day Type of Home: House Home Access: Stairs to enter [employer] of Steps: 3 Entrance Stairs-Rails: Right;Left;Can reach both Home Layout: Two level;Able to live on main level with bedroom/bathroom Alternate Level Stairs-Number of Steps: 14 Alternate Level Stairs-Rails: Right;Left;Can reach both Bathroom Shower/Tub: Walk-in shower;Curtain   Bathroom Toilet: Handicapped height     Home Equipment: Agricultural consultant (2 wheels);BSC/3in1;Shower seat;Lift chair          Prior Functioning/Environment Prior Level of Function : Needs assist;History of Falls (last six months)  Cognitive Assist : ADLs (cognitive);Mobility (cognitive) Mobility (Cognitive): Intermittent cues ADLs (Cognitive): Intermittent cues Physical Assist : ADLs (physical);Mobility (physical) Mobility (physical): Gait;Stairs ADLs (physical): Bathing;Dressing;IADLs Mobility Comments: Uses RW, was walking 100-300' during last 2 admissions; after going home got weaker and had some falls ADLs Comments: Independent with self-feeding and grooming. Pt reports bathing, dressing, and toileting with supervision from wife for safety. Pt also reports use of shower chair. Pt's wife assists  with IADLs. Pt is a retired Passenger transport manager.     OT Problem List: Decreased strength;Decreased activity tolerance;Impaired balance (sitting and/or standing);Decreased cognition;Pain   OT Treatment/Interventions: Self-care/ADL training;Therapeutic activities;DME and/or AE instruction;Balance training      OT Goals(Current goals can be found in the care plan section)   Acute Rehab OT Goals Patient Stated Goal: to get rid of my back pain OT Goal Formulation: With patient Time For Goal Achievement: 01/11/24 Potential to Achieve Goals: Fair ADL Goals Pt Will Perform Grooming: with supervision;standing Pt Will Perform Lower Body Bathing: with supervision;with adaptive equipment;sit to/from stand Pt Will Perform Lower Body Dressing: with supervision;with adaptive equipment;sit to/from stand Pt Will Perform Tub/Shower Transfer: Shower transfer;with contact guard assist;ambulating;shower seat;rolling walker Additional ADL Goal #1: Pt will walk to bathroom with walker and complete all toileting tasks with supervision.   OT Frequency:  Min 2X/week    Co-evaluation              AM-PAC OT 6 Clicks Daily Activity     Outcome Measure Help from another person eating meals?: A Little Help from another person taking care of personal grooming?: A Little Help from another person toileting, which includes using toliet, bedpan, or urinal?: A Lot Help from another person bathing (including washing, rinsing, drying)?: A Lot Help from another person to put on and taking off regular upper body clothing?: A Little Help from another person to put on and taking off regular lower body clothing?: A Lot 6 Click Score: 15   End of Session Equipment Utilized During Treatment: Rolling walker (2 wheels) Nurse Communication: Mobility status  Activity Tolerance: Patient limited by pain Patient left: in chair;with call bell/phone within reach  OT Visit Diagnosis: Unsteadiness on feet (R26.81);Pain Pain - part of body:  (back)                Time:  8955-8891 OT Time Calculation (min): 24 min Charges:  OT General Charges $OT Visit: 1 Visit OT Evaluation $OT Eval Moderate Complexity: 1 Mod  Joshua Silvano Dragon 12/28/2023, 11:27 AM

## 2023-12-28 NOTE — Inpatient Diabetes Management (Signed)
 Inpatient Diabetes Program Recommendations  AACE/ADA: New Consensus Statement on Inpatient Glycemic Control (2015)  Target Ranges:  Prepandial:   less than 140 mg/dL      Peak postprandial:   less than 180 mg/dL (1-2 hours)      Critically ill patients:  140 - 180 mg/dL   Lab Results  Component Value Date   GLUCAP 212 (H) 12/28/2023   HGBA1C 7.5 (H) 08/06/2023    Review of Glycemic Control  Latest Reference Range & Units 12/27/23 07:48 12/27/23 12:22 12/27/23 17:35 12/27/23 21:13 12/28/23 07:53  Glucose-Capillary 70 - 99 mg/dL 786 (H) 806 (H) 776 (H) 285 (H) 212 (H)  (H): Data is abnormally high  Diabetes history: DM2 Outpatient Diabetes medications: Metformin  500 mg BID Current orders for Inpatient glycemic control: Novolog  0-9 units TID and 0-5 units QHS  Inpatient Diabetes Program Recommendations:    Might consider:  Glargine 10 units every day.  Thank you, Wyvonna Pinal, MSN, CDCES Diabetes Coordinator Inpatient Diabetes Program [phone] (team pager from 8a-5p)

## 2023-12-28 NOTE — Progress Notes (Signed)
 PROGRESS NOTE    John [name]  FMW:[id number] DOB: [birth year] DOA: 12/26/2023 PCP: [name], DO   Brief Narrative:  John [name] is a 78 y.o. male with medical history significant for hypertension, hyperlipidemia, type 2 diabetes mellitus, history of CVA, cognitive deficits, CAD, chronic HFrEF, provoked DVT no longer anticoagulated, BPH, urinary retention with Foley catheter, and recent admissions with UTI, bacteremia, and pneumonia who now returns with abdominal pain, nausea, and vomiting.   Patient returned home from the hospital on 12/22/2023, had been doing fairly well initially, but then developed recurrent abdominal pain with nausea and nonbloody vomiting last night.  Since then, he has continued to grimace while gesturing to his periumbilical abdomen.  He has also been vomiting repeatedly and unable to tolerate anything by mouth.  His wife does not believe that he has moved his bowels since he returned home.  She does not feel that she can adequately care for him and inquires about nursing home placement.  Nausea and Vomiting improved with the diet and been advanced and will continue to treat his constipation. PT/OT recommending SNF and TOC assisting w/ Disposition and have provided the patient and family w/ Bed Offers.   Assessment and Plan:  Nausea and Vomiting: Improving. Exam is benign and there No acute intra-abdominal findings or explanation for the patient's symptoms on CT Imaging.  CT imaging did show cholelithiasis without evidence of cholecystitis or biliary dilatation and there is mildly prominent stool throughout the colon suggesting constipation. IVF now stopped. Treating Constipation as below and will continue to Monitor Electrolytes. Was on CLD and now on SOFT and tolerating without issues   ? UTI: Has a Hx of Urinary Retention w/ Foley Cathter and recent admits w/ UTI and Bacteremia. Urinalysis was done and showed cloudy appearance with greater than 500  glucose, small hemoglobin, 5 ketones, large leukocytes, negative nitrites, rare bacteria and greater than 50 RBCs per high-power field and greater than 50 WBCs.  Blood cultures x 2 pending and urine culture showing 50,000 CFU of Yeast.  CT scan showed decompressed bladder with persistent diffuse wall thickening. Initiated IV Ceftriaxone  while awaiting Cx Results but suspect colonization and Funguria/ Asymptomatic Candiuria that is asymptomatic so will stop Abx. WBC went from 11.7 -> 9.3 -> 8.9. Changed Foley Catheter.   T12 Compression Deformity: CT Scan done and showed new mild superior endplate compression deformity at T12 without osseous retropulsion or significant paraspinal soft tissue abnormality. Pain control and will need PT/OT to Evaluate and Treat and they are recommending SNF.   Hypokalemia: K+ is now 3.3. Replete w/ po KCL 40 mEQ BID x2. CTM and Replete as Necessary. Repeat CMP in the AM  Hypomagnesemia: Mag Level is now 1.8. Replete w/ IV Mag Sulfate 2 grams. CTM and Replete as Necessary. Repeat Mag Level in the AM   Chronic Systolic CHF: Appears hypovolemic; therapies have been limited by hypotension. Continue to hold Furosemide . Was cautiously hydrated w/ IVF but now stopped. Strict I's and O's and Daily Weights. CTM for S/Sx of Volume Overload   Diabetes Mellitus Type 2: A1c was 7.5% in April 2025; Changed Very Sensitive Novolog  SSI q4h to Sensitive Novolog  SSI AC/HS. CTM CBGs per protocol. CBG Trend:  Recent Labs  Lab 12/27/23 0431 12/27/23 0748 12/27/23 1222 12/27/23 1735 12/27/23 2113 12/28/23 0753 12/28/23 1202  GLUCAP 192* 213* 193* 223* 285* 212* 270*  -Diabetes Education Coordinator consulted and will start the patient on Semglee  10 units every day  CAD:  Currently has No anginal symptoms; C/w Clopidogrel  75 mg po Dialy and Atorvastatin  80 mg po qHS  HLD: C/w Atorvastatin  80 m po qHS   Depression: C/w Esctialopram 10 mg po Daily   Hyponatremia: Improved. Na+ went  from 134 -> 137 -> 135. CTM and Trend and repeat CMP in the AM   Breast Cancer: Follows with [facility] and is s/p mastectomy and chemotherapy; stopped Tamoxifen  early due to PE    Hx of VTE:  Was considered provoked and he completed treatment    Cognitive Impairment: C/w Delirium Precautions   Constipation: Change Bowel Regimen to Senna-Docusate 1 tab po BID, Miralax  17 grams po BID, and add Bisacodyl  10 mg Rcprn. Check KUB in the AM   Hyperbilirubinemia: Mild. T Bili is now 2.1. CTM and Trend and repeat CMP in the AM   Normocytic Anemia: Hgb/Hct Trend:  Recent Labs  Lab 12/20/23 0520 12/21/23 0701 12/22/23 0459 12/26/23 1040 12/26/23 1112 12/27/23 0458 12/28/23 0526  HGB 11.1* 11.7* 11.5* 12.6* 13.3 11.8* 11.8*  HCT 34.3* 36.5* 35.1* 39.1 39.0 36.5* 35.6*  MCV 88.9 88.8 88.2 86.7  --  86.9 86.4  -Check Anemia Panel in the AM. CTM for S/Sx of Bleeding; No overt bleeding noted. Repeat CBC in the AM  Overweight: Complicates overall prognosis and care. Estimated body mass index is 25.3 kg/m as calculated from the following:   Height as of this encounter: 6' (1.829 m).   Weight as of this encounter: 84.6 kg. Weight Loss and Dietary Counseling given   DVT prophylaxis: enoxaparin  (LOVENOX ) injection 40 mg Start: 12/26/23 2200    Code Status: Limited: Do not attempt resuscitation (DNR) -DNR-LIMITED -Do Not Intubate/DNI  Family Communication: No family present @ bedside   Disposition Plan:  Level of care: Telemetry Status is: Observation The patient will require care spanning > 2 midnights and should be moved to inpatient because: PT OT recommending SNF and awaiting bed acceptance and nephrology dilation   Consultants:  None  Procedures:  As delineated as above  Antimicrobials:  Anti-infectives (From admission, onward)    Start     Dose/Rate Route Frequency Ordered Stop   12/27/23 1430  cefTRIAXone  (ROCEPHIN ) 1 g in sodium chloride  0.9 % 100 mL IVPB  Status:   Discontinued        1 g 200 mL/hr over 30 Minutes Intravenous Daily 12/27/23 1424 12/28/23 1345   12/26/23 1300  fluconazole  (DIFLUCAN ) tablet 150 mg        150 mg Oral  Once 12/26/23 1255 12/26/23 1301       Subjective: Seen and examined at bedside he is resting and in no complaints or distress.  Nausea vomiting is improved.  States that he is now having bowel movements.  No other concerns or complaints at this time.  Objective: Vitals:   12/28/23 0500 12/28/23 0510 12/28/23 0759 12/28/23 0935  BP:  (!) 153/73 (!) 172/83 (!) 174/74  Pulse:  81 81 80  Resp:  18 14   Temp:  (!) 97.5 F (36.4 C) (!) 97.5 F (36.4 C) (!) 97.4 F (36.3 C)  TempSrc:  Oral  Oral  SpO2:  93% 94% 94%  Weight: 84.6 kg     Height:        Intake/Output Summary (Last 24 hours) at 12/28/2023 1412 Last data filed at 12/28/2023 0936 Gross per 24 hour  Intake 585 ml  Output 1000 ml  Net -415 ml   Filed Weights   12/27/23 0731 12/27/23  1116 12/28/23 0500  Weight: 83.6 kg 86.6 kg 84.6 kg   Examination: Physical Exam:  Constitutional: WN/WD overweight elderly Caucasian male in no acute distress Respiratory: Diminished to auscultation bilaterally, no wheezing, rales, rhonchi or crackles. Normal respiratory effort and patient is not tachypenic. No accessory muscle use.  Unlabored breathing Cardiovascular: RRR, no murmurs / rubs / gallops. S1 and S2 auscultated. No extremity edema.  Abdomen: Soft, non-tender, non-distended. Bowel sounds positive.  GU: Deferred. Musculoskeletal: No clubbing / cyanosis of digits/nails. No joint deformity upper and lower extremities.  Skin: No rashes, lesions, ulcers. No induration; Warm and dry.  Neurologic: CN 2-12 grossly intact with no focal deficits. Romberg sign and cerebellar reflexes not assessed.  Psychiatric: Normal judgment and insight.  Awake and alert  Data Reviewed: I have personally reviewed following labs and imaging studies  CBC: Recent Labs  Lab  12/22/23 0459 12/26/23 1040 12/26/23 1112 12/27/23 0458 12/28/23 0526  WBC 8.7 11.7*  --  9.3 8.9  NEUTROABS  --  9.7*  --   --  6.0  HGB 11.5* 12.6* 13.3 11.8* 11.8*  HCT 35.1* 39.1 39.0 36.5* 35.6*  MCV 88.2 86.7  --  86.9 86.4  PLT 170 226  --  240 230   Basic Metabolic Panel: Recent Labs  Lab 12/22/23 0459 12/26/23 1040 12/26/23 1112 12/27/23 0458 12/28/23 0526  NA 140 133* 134* 137 135  K 3.6 3.5 3.6 3.7 3.3*  CL 103 94* 95* 97* 96*  CO2 22 25  --  26 25  GLUCOSE 141* 291* 300* 210* 217*  BUN 9 8 8 12 11   CREATININE 0.69 0.66 0.70 0.68 0.64  CALCIUM  9.3 9.8  --  9.7 9.3  MG  --   --   --  1.4* 1.8  PHOS  --   --   --  3.6 2.8   GFR: Estimated Creatinine Clearance: 84.9 mL/min (by C-G formula based on SCr of 0.64 mg/dL). Liver Function Tests: Recent Labs  Lab 12/26/23 1040 12/27/23 0458 12/28/23 0526  AST 20 16 15   ALT 21 18 16   ALKPHOS 76 68 69  BILITOT 1.9* 2.0* 2.1*  PROT 6.4* 5.7* 5.5*  ALBUMIN 3.9 3.6 3.5   Recent Labs  Lab 12/26/23 1040  LIPASE 19   No results for input(s): AMMONIA in the last 168 hours. Coagulation Profile: No results for input(s): INR, PROTIME in the last 168 hours. Cardiac Enzymes: No results for input(s): CKTOTAL, CKMB, CKMBINDEX, TROPONINI in the last 168 hours. BNP (last 3 results) No results for input(s): PROBNP in the last 8760 hours. HbA1C: No results for input(s): HGBA1C in the last 72 hours. CBG: Recent Labs  Lab 12/27/23 1222 12/27/23 1735 12/27/23 2113 12/28/23 0753 12/28/23 1202  GLUCAP 193* 223* 285* 212* 270*   Lipid Profile: No results for input(s): CHOL, HDL, LDLCALC, TRIG, CHOLHDL, LDLDIRECT in the last 72 hours. Thyroid  Function Tests: No results for input(s): TSH, T4TOTAL, FREET4, T3FREE, THYROIDAB in the last 72 hours. Anemia Panel: No results for input(s): VITAMINB12, FOLATE, FERRITIN, TIBC, IRON, RETICCTPCT in the last 72 hours. Sepsis  Labs: Recent Labs  Lab 12/26/23 1112  LATICACIDVEN 2.1*   Recent Results (from the past 240 hours)  Blood culture (routine x 2)     Status: None   Collection Time: 12/19/23  8:11 PM   Specimen: BLOOD  Result Value Ref Range Status   Specimen Description   Final    BLOOD SITE NOT SPECIFIED Performed at [facility], [address], [place], [place] [address]    Special Requests   Final    Blood Culture results may not be optimal due to an inadequate volume of blood received in culture bottles BOTTLES DRAWN AEROBIC AND ANAEROBIC Performed at [facility], [address] [place], [place] [address]    Culture   Final    NO GROWTH 5 DAYS Performed at [facility], [address] [place], [place] [address]    Report Status 12/24/2023 FINAL  Final  Blood culture (routine x 2)     Status: None   Collection Time: 12/19/23  8:11 PM   Specimen: BLOOD  Result Value Ref Range Status   Specimen Description   Final    BLOOD SITE NOT SPECIFIED Performed at [facility], [address] [place], [place] [address]    Special Requests   Final    Blood Culture adequate volume BOTTLES DRAWN AEROBIC AND ANAEROBIC Performed at [facility], [address] [place], [place] [address]    Culture   Final    NO GROWTH 5 DAYS Performed at [facility], [address], [place], [place] [address]    Report Status 12/24/2023 FINAL  Final  Urine Culture     Status: Abnormal   Collection Time: 12/19/23  9:02 PM   Specimen: Urine, Catheterized  Result Value Ref Range Status   Specimen Description   Final    URINE, CATHETERIZED Performed at [facility], [address], [place], [place] [address]    Special Requests   Final    NONE Performed at [facility], [address] [place], [place] [address]    Culture >=100,000 COLONIES/mL YEAST (A)  Final   Report Status  12/20/2023 FINAL  Final  Blood culture (routine x 2)     Status: None (Preliminary result)   Collection Time: 12/26/23 10:44 AM   Specimen: BLOOD LEFT FOREARM  Result Value Ref Range Status   Specimen Description   Final    BLOOD LEFT FOREARM Performed at [facility], [address], [place], [place] [address]    Special Requests   Final    BOTTLES DRAWN AEROBIC AND ANAEROBIC Blood Culture results may not be optimal due to an inadequate volume of blood received in culture bottles Performed at [facility], [address] [place], [place] [address]    Culture   Final    NO GROWTH 2 DAYS Performed at [facility], [address] [place], [place] [address]    Report Status PENDING  Incomplete  Blood culture (routine x 2)     Status: None (Preliminary result)   Collection Time: 12/26/23 10:49 AM   Specimen: BLOOD RIGHT FOREARM  Result Value Ref Range Status   Specimen Description   Final    BLOOD RIGHT FOREARM Performed at [facility], [address] [place], [place] [address]    Special Requests   Final    BOTTLES DRAWN AEROBIC AND ANAEROBIC Blood Culture results may not be optimal due to an inadequate volume of blood received in culture bottles Performed at [facility], [address], [place], [place] [address]    Culture   Final    NO GROWTH 2 DAYS Performed at [facility], [address] [place], [place] [address]    Report Status PENDING  Incomplete  Urine Culture  Status: Abnormal   Collection Time: 12/26/23 10:53 AM   Specimen: Urine, Random  Result Value Ref Range Status   Specimen Description   Final    URINE, RANDOM Performed at [facility], [address], [place], [place] [address]    Special Requests   Final    NONE Reflexed from [phone] [id number] Performed at [facility], [address] [place], [place] [address]    Culture 50,000 COLONIES/mL YEAST (A)   Final   Report Status 12/27/2023 FINAL  Final    Radiology Studies: No results found.  Scheduled Meds:  atorvastatin   80 mg Oral QHS   Chlorhexidine  Gluconate Cloth  6 each Topical Daily   clopidogrel   75 mg Oral Q breakfast   enoxaparin  (LOVENOX ) injection  40 mg Subcutaneous Q24H   escitalopram   10 mg Oral Daily   insulin  aspart  0-5 Units Subcutaneous QHS   insulin  aspart  0-9 Units Subcutaneous TID WC   insulin  glargine  10 Units Subcutaneous Q24H   pantoprazole   40 mg Oral QHS   polyethylene glycol  17 g Oral BID   potassium chloride   40 mEq Oral BID   senna-docusate  1 tablet Oral BID   sodium chloride  flush  3 mL Intravenous Q12H   sodium phosphate   1 enema Rectal Once   Continuous Infusions:   LOS: 0 days   Alejandro Marker, DO [facility] Available via Epic secure chat 7am-7pm After these hours, please refer to coverage provider listed on [email or web address] 12/28/2023, 2:12 PM

## 2023-12-28 NOTE — TOC Progression Note (Addendum)
 Transition of Care [facility]) - Progression Note    Patient Details  Name: John [name] MRN: [id number] Date of Birth: April 14, 1945  Transition of Care [facility]) CM/SW Contact  Heather [name] Saltness, LCSW Phone Number: 12/28/2023, 10:20 AM  Clinical Narrative:     ADDENDUM  3:57 PM - CSW received voicemail from pt's wife, [name] Hopple [phone], stating that she will review facilities, [facility] and [facility], and call CSW tomorrow with decision of bed choice. TOC will continue to follow.  CSW met with pt at bedside to discuss short-term rehab upon discharge and obtain pt's preferred facility. CSW provided pt  with list of facilities with available beds, including name of facility, location, and Medicare Star-Ratings. Pt reports he will review bed offers with spouse, [name] Hottle, when she visits him today. CSW left her name and phone number in pt's room. Pt's spouse to call CSW with bed choice. TOC will continue to follow.   Medicare [facility] [address] [place], [place] [address] [phone] Overall rating ???? Above average  [facility] [address] [place], [place] [address] [phone] Overall rating ??? Much above average   Expected Discharge Plan: Skilled Nursing Facility Barriers to Discharge: Continued Medical Work up    Expected Discharge Plan and Services  SNF   Post Acute Care Choice: Skilled Nursing Facility Living arrangements for the past 2 months: Single Family Home                 DME Arranged: N/A DME Agency: NA       HH Arranged: NA HH Agency: NA         Social Drivers of Health (SDOH) Interventions SDOH Screenings   Food Insecurity: No Food Insecurity (12/27/2023)  Housing: Low Risk  (12/27/2023)  Transportation Needs: No Transportation Needs (12/27/2023)  Utilities: Not At Risk (12/27/2023)  Alcohol Screen: Low Risk  (01/27/2023)  Depression (PHQ2-9): Low Risk  (01/28/2023)   Financial Resource Strain: Low Risk  (10/03/2023)  Physical Activity: Inactive (10/03/2023)  Social Connections: Unknown (12/27/2023)  Recent Concern: Social Connections - Socially Isolated (12/05/2023)  Stress: No Stress Concern Present (10/03/2023)  Tobacco Use: Low Risk  (12/26/2023)    Readmission Risk Interventions    12/06/2023    4:03 PM 05/11/2023   12:52 PM 04/22/2023   12:12 PM  Readmission Risk Prevention Plan  Medication Screening   Complete  Transportation Screening Complete Complete Complete  PCP or Specialist Appt within 3-5 Days  Complete   HRI or Home Care Consult  Complete   Social Work Consult for Recovery Care Planning/Counseling  Complete   Palliative Care Screening  Not Applicable   Medication Review Oceanographer) Complete Complete   HRI or Home Care Consult Complete    SW Recovery Care/Counseling Consult Complete    Palliative Care Screening Not Applicable    Skilled Nursing Facility Not Applicable      Signed: Heather Saltness, MSW, LCSW Clinical Social Worker Inpatient Care Management 12/28/2023 12:32 PM

## 2023-12-29 ENCOUNTER — Observation Stay (HOSPITAL_COMMUNITY)

## 2023-12-29 ENCOUNTER — Ambulatory Visit (INDEPENDENT_AMBULATORY_CARE_PROVIDER_SITE_OTHER): Payer: Medicare Other

## 2023-12-29 DIAGNOSIS — I878 Other specified disorders of veins: Secondary | ICD-10-CM | POA: Diagnosis not present

## 2023-12-29 DIAGNOSIS — I420 Dilated cardiomyopathy: Secondary | ICD-10-CM | POA: Diagnosis not present

## 2023-12-29 DIAGNOSIS — R112 Nausea with vomiting, unspecified: Secondary | ICD-10-CM | POA: Diagnosis not present

## 2023-12-29 DIAGNOSIS — K8020 Calculus of gallbladder without cholecystitis without obstruction: Secondary | ICD-10-CM | POA: Diagnosis not present

## 2023-12-29 DIAGNOSIS — I2510 Atherosclerotic heart disease of native coronary artery without angina pectoris: Secondary | ICD-10-CM | POA: Diagnosis not present

## 2023-12-29 DIAGNOSIS — K5900 Constipation, unspecified: Secondary | ICD-10-CM | POA: Diagnosis not present

## 2023-12-29 DIAGNOSIS — R339 Retention of urine, unspecified: Secondary | ICD-10-CM | POA: Diagnosis not present

## 2023-12-29 DIAGNOSIS — R4189 Other symptoms and signs involving cognitive functions and awareness: Secondary | ICD-10-CM | POA: Diagnosis not present

## 2023-12-29 LAB — CBC WITH DIFFERENTIAL/PLATELET
Abs Immature Granulocytes: 0.04 K/uL (ref 0.00–0.07)
Basophils Absolute: 0.1 K/uL (ref 0.0–0.1)
Basophils Relative: 1 %
Eosinophils Absolute: 0.2 K/uL (ref 0.0–0.5)
Eosinophils Relative: 3 %
HCT: 37.9 % — ABNORMAL LOW (ref 39.0–52.0)
Hemoglobin: 12.2 g/dL — ABNORMAL LOW (ref 13.0–17.0)
Immature Granulocytes: 1 %
Lymphocytes Relative: 26 %
Lymphs Abs: 1.9 K/uL (ref 0.7–4.0)
MCH: 27.9 pg (ref 26.0–34.0)
MCHC: 32.2 g/dL (ref 30.0–36.0)
MCV: 86.5 fL (ref 80.0–100.0)
Monocytes Absolute: 0.7 K/uL (ref 0.1–1.0)
Monocytes Relative: 9 %
Neutro Abs: 4.5 K/uL (ref 1.7–7.7)
Neutrophils Relative %: 60 %
Platelets: 273 K/uL (ref 150–400)
RBC: 4.38 MIL/uL (ref 4.22–5.81)
RDW: 18.9 % — ABNORMAL HIGH (ref 11.5–15.5)
WBC: 7.3 K/uL (ref 4.0–10.5)
nRBC: 0 % (ref 0.0–0.2)

## 2023-12-29 LAB — COMPREHENSIVE METABOLIC PANEL WITH GFR
ALT: 17 U/L (ref 0–44)
AST: 21 U/L (ref 15–41)
Albumin: 3.5 g/dL (ref 3.5–5.0)
Alkaline Phosphatase: 73 U/L (ref 38–126)
Anion gap: 13 (ref 5–15)
BUN: 8 mg/dL (ref 8–23)
CO2: 26 mmol/L (ref 22–32)
Calcium: 9.8 mg/dL (ref 8.9–10.3)
Chloride: 97 mmol/L — ABNORMAL LOW (ref 98–111)
Creatinine, Ser: 0.75 mg/dL (ref 0.61–1.24)
GFR, Estimated: >60 mL/min (ref >60)
Glucose, Bld: 205 mg/dL — ABNORMAL HIGH (ref 70–99)
Potassium: 3.6 mmol/L (ref 3.5–5.1)
Sodium: 136 mmol/L (ref 135–145)
Total Bilirubin: 2 mg/dL — ABNORMAL HIGH (ref 0.0–1.2)
Total Protein: 5.9 g/dL — ABNORMAL LOW (ref 6.5–8.1)

## 2023-12-29 LAB — RETICULOCYTES
Immature Retic Fract: 20.8 % — ABNORMAL HIGH (ref 2.3–15.9)
RBC.: 4.24 MIL/uL (ref 4.22–5.81)
Retic Count, Absolute: 103.9 K/uL (ref 19.0–186.0)
Retic Ct Pct: 2.5 % (ref 0.4–3.1)

## 2023-12-29 LAB — FOLATE: Folate: 13.9 ng/mL (ref >5.9)

## 2023-12-29 LAB — GLUCOSE, CAPILLARY
Glucose-Capillary: 198 mg/dL — ABNORMAL HIGH (ref 70–99)
Glucose-Capillary: 329 mg/dL — ABNORMAL HIGH (ref 70–99)
Glucose-Capillary: 280 mg/dL — ABNORMAL HIGH (ref 70–99)
Glucose-Capillary: 235 mg/dL — ABNORMAL HIGH (ref 70–99)

## 2023-12-29 LAB — PHOSPHORUS: Phosphorus: 3.2 mg/dL (ref 2.5–4.6)

## 2023-12-29 LAB — IRON AND TIBC
Iron: 39 ug/dL — ABNORMAL LOW (ref 45–182)
Saturation Ratios: 16 % — ABNORMAL LOW (ref 17.9–39.5)
TIBC: 239 ug/dL — ABNORMAL LOW (ref 250–450)
UIBC: 201 ug/dL

## 2023-12-29 LAB — MAGNESIUM: Magnesium: 1.9 mg/dL (ref 1.7–2.4)

## 2023-12-29 LAB — FERRITIN: Ferritin: 57 ng/mL (ref 24–336)

## 2023-12-29 MED ORDER — POTASSIUM CHLORIDE CRYS ER 20 MEQ PO TBCR: 40.0000 meq | EXTENDED_RELEASE_TABLET | Freq: Once | ORAL | Status: DC

## 2023-12-29 NOTE — Plan of Care (Signed)

## 2023-12-29 NOTE — TOC Progression Note (Addendum)
 Transition of Care [facility]) - Progression Note    Patient Details  Name: John [name] MRN: [id number] Date of Birth: [birth year]  Transition of Care [facility]) CM/SW Contact  Sonda Manuella Quill, RN Phone Number: 12/29/2023, 2:29 PM  Clinical Narrative:    [place] w/ pt's wife [name]; she has selected [facility]; facility notified via SNF hub; also LVM for [place] at facility; awaiting return call; ins auth initiated via [place].  [id number] Chol at facility notified.  -1723- submitted for ins auth; received Auth ID # [id number]; awaiting ins auth  Expected Discharge Plan: Skilled Nursing Facility Barriers to Discharge: Continued Medical Work up               Expected Discharge Plan and Services     Post Acute Care Choice: Skilled Nursing Facility Living arrangements for the past 2 months: Single Family Home                 DME Arranged: N/A DME Agency: NA       HH Arranged: NA HH Agency: NA         Social Drivers of Health (SDOH) Interventions SDOH Screenings   Food Insecurity: No Food Insecurity (12/27/2023)  Housing: Low Risk  (12/27/2023)  Transportation Needs: No Transportation Needs (12/27/2023)  Utilities: Not At Risk (12/27/2023)  Alcohol Screen: Low Risk  (01/27/2023)  Depression (PHQ2-9): Low Risk  (01/28/2023)  Financial Resource Strain: Low Risk  (10/03/2023)  Physical Activity: Inactive (10/03/2023)  Social Connections: Unknown (12/27/2023)  Recent Concern: Social Connections - Socially Isolated (12/05/2023)  Stress: No Stress Concern Present (10/03/2023)  Tobacco Use: Low Risk  (12/26/2023)    Readmission Risk Interventions    12/06/2023    4:03 PM 05/11/2023   12:52 PM 04/22/2023   12:12 PM  Readmission Risk Prevention Plan  Medication Screening   Complete  Transportation Screening Complete Complete Complete  PCP or Specialist Appt within 3-5 Days  Complete   HRI or Home Care Consult  Complete   Social Work Consult for Recovery Care  Planning/Counseling  Complete   Palliative Care Screening  Not Applicable   Medication Review Oceanographer) Complete Complete   HRI or Home Care Consult Complete    SW Recovery Care/Counseling Consult Complete    Palliative Care Screening Not Applicable    Skilled Nursing Facility Not Applicable

## 2023-12-29 NOTE — Plan of Care (Signed)
   Problem: Education: Goal: Knowledge of General Education information will improve Description Including pain rating scale, medication(s)/side effects and non-pharmacologic comfort measures Outcome: Progressing   Problem: Health Behavior/Discharge Planning: Goal: Ability to manage health-related needs will improve Outcome: Progressing

## 2023-12-29 NOTE — Progress Notes (Signed)
 PROGRESS NOTE    John [name]  FMW:[id number] DOB: 1946/03/29 DOA: 12/26/2023 PCP: [name], DO   Brief Narrative:  John [name] is a 78 y.o. male with medical history significant for hypertension, hyperlipidemia, type 2 diabetes mellitus, history of CVA, cognitive deficits, CAD, chronic HFrEF, provoked DVT no longer anticoagulated, BPH, urinary retention with Foley catheter, and recent admissions with UTI, bacteremia, and pneumonia who now returns with abdominal pain, nausea, and vomiting.   Patient returned home from the hospital on 12/22/2023, had been doing fairly well initially, but then developed recurrent abdominal pain with nausea and nonbloody vomiting last night.  Since then, he has continued to grimace while gesturing to his periumbilical abdomen.  He has also been vomiting repeatedly and unable to tolerate anything by mouth.  His wife does not believe that he has moved his bowels since he returned home.  She does not feel that she can adequately care for him and inquires about nursing home placement.  Nausea and Vomiting improved with the diet and been advanced and will continue to treat his constipation. PT/OT recommending SNF and TOC assisting w/ Disposition as SNF has been selected and awaiting Bed offers.   Assessment and Plan:  Nausea and Vomiting: Improved. Exam is benign and there No acute intra-abdominal findings or explanation for the patient's symptoms on CT Imaging.  CT imaging did show cholelithiasis without evidence of cholecystitis or biliary dilatation and there was mildly prominent stool throughout the colon suggesting constipation. IVF now stopped. Treating Constipation as below and will continue to Monitor Electrolytes. 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 Urinalysis was done and showed cloudy appearance with greater  than 500 glucose, small hemoglobin, 5 ketones, large leukocytes, negative nitrites, rare bacteria and greater than 50 RBCs per high-power field and greater than 50 WBCs.  Blood cultures x 2 showed NGTD @ 3 Days; Urine culture showing 50,000 CFU of Yeast.  CT scan showed decompressed bladder with persistent diffuse wall thickening. Had Initiated IV Ceftriaxone  while awaiting Cx Results but suspect colonization and Funguria/ Asymptomatic Candiuria that is asymptomatic so will stop Abx. WBC went from 11.7 -> 9.3 -> 8.9 -> 7.3. Changed Foley Catheter this Admission.   T12 Compression Deformity: CT Scan done and showed new mild superior endplate compression deformity at T12 without osseous retropulsion or significant paraspinal soft tissue abnormality. Pain control and will need PT/OT to Evaluate and Treat and they are recommending SNF.   Hypokalemia: K+ is now 3.6. Replete w/ po KCL 40 mEQ x1. CTM and Replete as Necessary. Repeat CMP in the AM  Hypomagnesemia: Mag Level is now 1.9. Replete w/ IV Mag Sulfate 2 grams. CTM and Replete as Necessary. Repeat Mag Level in the AM   Chronic Systolic CHF: Appears hypovolemic; therapies have been limited by hypotension. Continue to hold Furosemide . Was cautiously hydrated w/ IVF but now stopped. Strict I's and O's and Daily Weights.  Intake/Output Summary (Last 24 hours) at 12/29/2023 1605 Last data filed at 12/29/2023 0930 Gross per 24 hour  Intake 240 ml  Output 1150 ml  Net -910 ml  -CTM for S/Sx of Volume Overload   Diabetes Mellitus Type 2: A1c was 7.5% in April 2025; Changed Very Sensitive Novolog  SSI q4h to Sensitive Novolog  SSI AC/HS. CTM CBGs per protocol. CBG Trend:  Recent Labs  Lab 12/27/23 2113 12/28/23  9246 12/28/23 1202 12/28/23 1714 12/28/23 2209 12/29/23 0806 12/29/23 1241  GLUCAP 285* 212* 270* 195* 264* 198* 329*  -Diabetes Education Coordinator consulted and will start the patient on Semglee  10 units every day  CAD: Currently has No  anginal symptoms; C/w Clopidogrel  75 mg po Dialy and Atorvastatin  80 mg po qHS  HLD: C/w Atorvastatin  80 m po qHS   Depression: C/w Esctialopram 10 mg po Daily   Hyponatremia: Mild and Improved. Na+ went from 134 -> 137 -> 135 -> 136. CTM and Trend and repeat CMP in the AM   Breast Cancer: Follows with [facility] and is s/p mastectomy and chemotherapy; stopped Tamoxifen  early due to PE    Hx of VTE:  Was considered provoked and he completed treatment    Cognitive Impairment: C/w Delirium Precautions   Constipation: Changed Bowel Regimen to Senna-Docusate 1 tab po BID, Miralax  17 grams po BID, and add Bisacodyl  10 mg Rcprn. Checked KUB and showed Diminished colonic stool from recent CT. Small volume of formed stool persists. No small bowel distension or evidence of obstruction. Low pelvis including rectum not included in the field of view. Gallstones on CT are not well seen by radiograph. Multiple pelvic phleboliths.  Hyperbilirubinemia: Mild. T Bili is now 2.0. CTM and Trend and repeat CMP in the AM   Normocytic Anemia: Hgb/Hct Trend Stable:  Recent Labs  Lab 12/21/23 0701 12/22/23 0459 12/26/23 1040 12/26/23 1112 12/27/23 0458 12/28/23 0526 12/29/23 0535  HGB 11.7* 11.5* 12.6* 13.3 11.8* 11.8* 12.2*  HCT 36.5* 35.1* 39.1 39.0 36.5* 35.6* 37.9*  MCV 88.8 88.2 86.7  --  86.9 86.4 86.5  -Checked Anemia Panel showed an iron level 39, UIBC 2 1, TIBC 239, saturation ratio 60%, ferritin 157, folate level of 13.9. CTM for S/Sx of Bleeding; No overt bleeding noted. Repeat CBC in the AM  Overweight: Complicates overall prognosis and care. Estimated body mass index is 23.44 kg/m as calculated from the following:   Height as of this encounter: 6' (1.829 m).   Weight as of this encounter: 78.4 kg. Weight Loss and Dietary Counseling given   DVT prophylaxis: enoxaparin  (LOVENOX ) injection 40 mg Start: 12/26/23 2200    Code Status: Limited: Do not attempt resuscitation (DNR) -DNR-LIMITED  -Do Not Intubate/DNI  Family Communication: D/w wife over the telephone  Disposition Plan:  Level of care: Telemetry Status is: Observation The patient remains OBS appropriate and will d/c before 2 midnights.   Consultants:  None  Procedures:  As delineated as above   Antimicrobials:  Anti-infectives (From admission, onward)    Start     Dose/Rate Route Frequency Ordered Stop   12/27/23 1430  cefTRIAXone  (ROCEPHIN ) 1 g in sodium chloride  0.9 % 100 mL IVPB  Status:  Discontinued        1 g 200 mL/hr over 30 Minutes Intravenous Daily 12/27/23 1424 12/28/23 1345   12/26/23 1300  fluconazole  (DIFLUCAN ) tablet 150 mg        150 mg Oral  Once 12/26/23 1255 12/26/23 1301       Subjective: Seen and examined at bedside and resting in bed she is doing okay.  No nausea or vomiting.  States that he is having bowel movements.  Refuses MiraLAX .  Denies any lightheadedness or dizziness.  No other concerns or complaints at this time.  Objective: Vitals:   12/28/23 1435 12/28/23 2003 12/29/23 0500 12/29/23 1458  BP: (!) 161/90 (!) 158/81  (!) 149/75  Pulse: 84 88  73  Resp:  18  18  Temp: 98.6 F (37 C) 98.4 F (36.9 C)  97.9 F (36.6 C)  TempSrc: Oral Oral  Oral  SpO2:  96%  95%  Weight:   78.4 kg   Height:        Intake/Output Summary (Last 24 hours) at 12/29/2023 1606 Last data filed at 12/29/2023 0930 Gross per 24 hour  Intake 240 ml  Output 1150 ml  Net -910 ml   Filed Weights   12/27/23 1116 12/28/23 0500 12/29/23 0500  Weight: 86.6 kg 84.6 kg 78.4 kg   Examination: Physical Exam:  Constitutional: Elderly Caucasian male in no acute distress Respiratory: Diminished to auscultation bilaterally, no wheezing, rales, rhonchi or crackles. Normal respiratory effort and patient is not tachypenic. No accessory muscle use.  Unlabored breathing Cardiovascular: RRR, no murmurs / rubs / gallops. S1 and S2 auscultated.  No appreciable extremity edema.  Abdomen: Soft, non-tender,  non-distended. Bowel sounds positive.  GU: Deferred. Musculoskeletal: No clubbing / cyanosis of digits/nails. No joint deformity upper and lower extremities.  Skin: No rashes, lesions, ulcers on a limited skin evaluation. No induration; Warm and dry.  Neurologic: CN 2-12 grossly intact with no focal deficits. Romberg sign and cerebellar reflexes not assessed.  Psychiatric: Normal judgment and insight. Alert and oriented x 3. Normal mood and appropriate affect.   Data Reviewed: I have personally reviewed following labs and imaging studies  CBC: Recent Labs  Lab 12/26/23 1040 12/26/23 1112 12/27/23 0458 12/28/23 0526 12/29/23 0535  WBC 11.7*  --  9.3 8.9 7.3  NEUTROABS 9.7*  --   --  6.0 4.5  HGB 12.6* 13.3 11.8* 11.8* 12.2*  HCT 39.1 39.0 36.5* 35.6* 37.9*  MCV 86.7  --  86.9 86.4 86.5  PLT 226  --  240 230 273   Basic Metabolic Panel: Recent Labs  Lab 12/26/23 1040 12/26/23 1112 12/27/23 0458 12/28/23 0526 12/29/23 0535  NA 133* 134* 137 135 136  K 3.5 3.6 3.7 3.3* 3.6  CL 94* 95* 97* 96* 97*  CO2 25  --  26 25 26   GLUCOSE 291* 300* 210* 217* 205*  BUN 8 8 12 11 8   CREATININE 0.66 0.70 0.68 0.64 0.75  CALCIUM  9.8  --  9.7 9.3 9.8  MG  --   --  1.4* 1.8 1.9  PHOS  --   --  3.6 2.8 3.2   GFR: Estimated Creatinine Clearance: 84.9 mL/min (by C-G formula based on SCr of 0.75 mg/dL). Liver Function Tests: Recent Labs  Lab 12/26/23 1040 12/27/23 0458 12/28/23 0526 12/29/23 0535  AST 20 16 15 21   ALT 21 18 16 17   ALKPHOS 76 68 69 73  BILITOT 1.9* 2.0* 2.1* 2.0*  PROT 6.4* 5.7* 5.5* 5.9*  ALBUMIN 3.9 3.6 3.5 3.5   Recent Labs  Lab 12/26/23 1040  LIPASE 19   No results for input(s): AMMONIA in the last 168 hours. Coagulation Profile: No results for input(s): INR, PROTIME in the last 168 hours. Cardiac Enzymes: No results for input(s): CKTOTAL, CKMB, CKMBINDEX, TROPONINI in the last 168 hours. BNP (last 3 results) No results for input(s):  PROBNP in the last 8760 hours. HbA1C: No results for input(s): HGBA1C in the last 72 hours. CBG: Recent Labs  Lab 12/28/23 1202 12/28/23 1714 12/28/23 2209 12/29/23 0806 12/29/23 1241  GLUCAP 270* 195* 264* 198* 329*   Lipid Profile: No results for input(s): CHOL, HDL, LDLCALC, TRIG, CHOLHDL, LDLDIRECT in the last 72 hours. Thyroid  Function  Tests: No results for input(s): TSH, T4TOTAL, FREET4, T3FREE, THYROIDAB in the last 72 hours. Anemia Panel: Recent Labs    12/28/23 1440 12/29/23 0535  VITAMINB12 1,143*  --   FOLATE  --  13.9  FERRITIN  --  57  TIBC  --  239*  IRON  --  39*  RETICCTPCT  --  2.5   Sepsis Labs: Recent Labs  Lab 12/26/23 1112  LATICACIDVEN 2.1*   Recent Results (from the past 240 hours)  Blood culture (routine x 2)     Status: None   Collection Time: 12/19/23  8:11 PM   Specimen: BLOOD  Result Value Ref Range Status   Specimen Description   Final    BLOOD SITE NOT SPECIFIED Performed at [facility], [address], [place], [place] [address]    Special Requests   Final    Blood Culture results may not be optimal due to an inadequate volume of blood received in culture bottles BOTTLES DRAWN AEROBIC AND ANAEROBIC Performed at [facility], [address] [place], [place] [address]    Culture   Final    NO GROWTH 5 DAYS Performed at [facility], [address] [place], [place] [address]    Report Status 12/24/2023 FINAL  Final  Blood culture (routine x 2)     Status: None   Collection Time: 12/19/23  8:11 PM   Specimen: BLOOD  Result Value Ref Range Status   Specimen Description   Final    BLOOD SITE NOT SPECIFIED Performed at [facility], [address], [place], [place] [address]    Special Requests   Final    Blood Culture adequate volume BOTTLES DRAWN AEROBIC AND ANAEROBIC Performed at [facility], [address]  [place], [place] [address]    Culture   Final    NO GROWTH 5 DAYS Performed at [facility], [address] [place], [place] [address]    Report Status 12/24/2023 FINAL  Final  Urine Culture     Status: Abnormal   Collection Time: 12/19/23  9:02 PM   Specimen: Urine, Catheterized  Result Value Ref Range Status   Specimen Description   Final    URINE, CATHETERIZED Performed at [facility], [address], [place], [place] [address]    Special Requests   Final    NONE Performed at [facility], [address] [place], [place] [address]    Culture >=100,000 COLONIES/mL YEAST (A)  Final   Report Status 12/20/2023 FINAL  Final  Blood culture (routine x 2)     Status: None (Preliminary result)   Collection Time: 12/26/23 10:44 AM   Specimen: BLOOD LEFT FOREARM  Result Value Ref Range Status   Specimen Description   Final    BLOOD LEFT FOREARM Performed at [facility], [address] [place], [place] [address]    Special Requests   Final    BOTTLES DRAWN AEROBIC AND ANAEROBIC Blood Culture results may not be optimal due to an inadequate volume of blood received in culture bottles Performed at [facility], [address], [place], [place] [address]    Culture   Final    NO GROWTH 3 DAYS Performed at [facility], [address], [place], [place] [address]    Report Status PENDING  Incomplete  Blood culture (routine x 2)     Status: None (  Preliminary result)   Collection Time: 12/26/23 10:49 AM   Specimen: BLOOD RIGHT FOREARM  Result Value Ref Range Status   Specimen Description   Final    BLOOD RIGHT FOREARM Performed at [facility], [address], [place], [place] [address]    Special Requests   Final    BOTTLES DRAWN AEROBIC AND ANAEROBIC Blood Culture results may not be optimal due to an inadequate volume of blood received in culture bottles Performed at [facility], [address] [place], [place] [address]    Culture   Final    NO GROWTH 3 DAYS Performed at [facility], [address], [place], [place] [address]    Report Status PENDING  Incomplete  Urine Culture     Status: Abnormal   Collection Time: 12/26/23 10:53 AM   Specimen: Urine, Random  Result Value Ref Range Status   Specimen Description   Final    URINE, RANDOM Performed at [facility], [address] [place], [place] [address]    Special Requests   Final    NONE Reflexed from [id number] Performed at [facility], [address] [place], [place] [address]    Culture 50,000 COLONIES/mL YEAST (A)  Final   Report Status 12/27/2023 FINAL  Final    Radiology Studies: DG Abd 1 View Result Date: 12/29/2023 CLINICAL DATA:  Constipation. EXAM: ABDOMEN - 1 VIEW COMPARISON:  CT 12/26/2023 FINDINGS: Diminished colonic stool from recent CT. Small volume of formed stool persists. No small bowel distension or evidence of obstruction. Low pelvis including rectum not included in the field of view. Gallstones on CT are not well seen by radiograph. Multiple pelvic phleboliths. IMPRESSION: Diminished colonic stool from recent CT. Small volume of formed stool persists. Electronically Signed   By: Andrea Gasman M.D.   On: 12/29/2023 10:23   Scheduled Meds:  atorvastatin   80 mg Oral QHS   Chlorhexidine  Gluconate Cloth  6 each Topical Daily   clopidogrel   75 mg Oral Q breakfast   enoxaparin  (LOVENOX ) injection  40 mg Subcutaneous Q24H   escitalopram   10 mg Oral Daily   insulin  aspart  0-5 Units Subcutaneous QHS   insulin  aspart  0-9 Units Subcutaneous TID WC   insulin  glargine  10 Units Subcutaneous Q24H   pantoprazole   40 mg Oral QHS   polyethylene glycol  17 g Oral BID   potassium chloride   40 mEq Oral Once   senna-docusate  1 tablet Oral BID   sodium chloride  flush  3 mL Intravenous Q12H   sodium phosphate   1 enema Rectal Once   Continuous  Infusions:   LOS: 0 days   Alejandro Marker, DO [facility] Available via Epic secure chat 7am-7pm After these hours, please refer to coverage provider listed on [email or web address] 12/29/2023, 4:06 PM

## 2023-12-30 DIAGNOSIS — I2510 Atherosclerotic heart disease of native coronary artery without angina pectoris: Secondary | ICD-10-CM | POA: Diagnosis not present

## 2023-12-30 DIAGNOSIS — R339 Retention of urine, unspecified: Secondary | ICD-10-CM | POA: Diagnosis not present

## 2023-12-30 DIAGNOSIS — R112 Nausea with vomiting, unspecified: Secondary | ICD-10-CM | POA: Diagnosis not present

## 2023-12-30 DIAGNOSIS — R4189 Other symptoms and signs involving cognitive functions and awareness: Secondary | ICD-10-CM | POA: Diagnosis not present

## 2023-12-30 LAB — CBC
HCT: 39.4 % (ref 39.0–52.0)
Hemoglobin: 12.9 g/dL — ABNORMAL LOW (ref 13.0–17.0)
MCH: 28.2 pg (ref 26.0–34.0)
MCHC: 32.7 g/dL (ref 30.0–36.0)
MCV: 86.2 fL (ref 80.0–100.0)
Platelets: 287 K/uL (ref 150–400)
RBC: 4.57 MIL/uL (ref 4.22–5.81)
RDW: 18.2 % — ABNORMAL HIGH (ref 11.5–15.5)
WBC: 9.1 K/uL (ref 4.0–10.5)
nRBC: 0 % (ref 0.0–0.2)

## 2023-12-30 LAB — GLUCOSE, CAPILLARY
Glucose-Capillary: 211 mg/dL — ABNORMAL HIGH (ref 70–99)
Glucose-Capillary: 299 mg/dL — ABNORMAL HIGH (ref 70–99)
Glucose-Capillary: 226 mg/dL — ABNORMAL HIGH (ref 70–99)

## 2023-12-30 MED ORDER — POLYETHYLENE GLYCOL 3350 17 G PO PACK: 17.0000 g | PACK | Freq: Every day | ORAL | Status: DC

## 2023-12-30 MED ORDER — ACETAMINOPHEN 325 MG PO TABS: 650.0000 mg | ORAL_TABLET | Freq: Four times a day (QID) | ORAL | Status: DC | PRN

## 2023-12-30 NOTE — Plan of Care (Signed)
  Problem: Clinical Measurements: Goal: Will remain free from infection Outcome: Progressing   Problem: Activity: Goal: Risk for activity intolerance will decrease Outcome: Progressing   Problem: Nutrition: Goal: Adequate nutrition will be maintained Outcome: Progressing   Problem: Coping: Goal: Level of anxiety will decrease Outcome: Progressing   Problem: Elimination: Goal: Will not experience complications related to bowel motility Outcome: Progressing Goal: Will not experience complications related to urinary retention Outcome: Progressing   Problem: Pain Managment: Goal: General experience of comfort will improve and/or be controlled Outcome: Progressing   Problem: Skin Integrity: Goal: Risk for impaired skin integrity will decrease Outcome: Progressing   Problem: Nutritional: Goal: Maintenance of adequate nutrition will improve Outcome: Progressing   Problem: Skin Integrity: Goal: Risk for impaired skin integrity will decrease Outcome: Progressing

## 2023-12-30 NOTE — Discharge Summary (Addendum)
 Physician Discharge Summary   Patient: John [name] MRN: [id number] DOB: 1945-05-26  Admit date:     12/26/2023  Discharge date: 01/02/24  Discharge Physician: John Marker, DO   PCP: John Mabel Mt, DO   Recommendations at discharge:   Follow-up with PCP within 1 to 2 weeks repeat CBC, CMP, mag, Phos within 1 week  Discharge Diagnoses: Principal Problem:   Intractable nausea and vomiting Active Problems:   Urinary retention   Depression, major, single episode, complete remission (HCC)   Type 2 diabetes mellitus with hyperglycemia, with long-term current use of insulin  (HCC)   Cognitive impairment   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 *  Hospital Course: John [name] is a 78 y.o. male with medical history significant for hypertension, hyperlipidemia, type 2 diabetes mellitus, history of CVA, cognitive deficits, CAD, chronic HFrEF, provoked DVT no longer anticoagulated, BPH, urinary retention with Foley catheter, and recent admissions with UTI, bacteremia, and pneumonia who now returns with abdominal pain, nausea, and vomiting.   Patient returned home from the hospital on 12/22/2023, had been doing fairly well initially, but then developed recurrent abdominal pain with nausea and nonbloody vomiting last night.  Since then, he has continued to grimace while gesturing to his periumbilical abdomen.  He has also been vomiting repeatedly and unable to tolerate anything by mouth.  His wife does not believe that he has moved his bowels since he returned home.  She does not feel that she can adequately care for him and inquires about nursing home placement.  Nausea and Vomiting improved with the diet and been advanced and will continue to treat his constipation.  PT/OT recommending SNF and TOC assisting w/ Disposition as SNF has been selected and they can accept the patient on Monday 01/02/24. He is medically stable for D/C at this time and awaiting transfer to SNF.   Assessment and Plan:  Nausea and Vomiting: Improved. Exam is benign and there No acute intra-abdominal findings or explanation for the patient's symptoms on CT Imaging.  CT imaging did show cholelithiasis without evidence of cholecystitis or biliary dilatation and there was mildly prominent stool throughout the colon suggesting constipation. IVF now stopped. Treating Constipation as below and will continue to Monitor Electrolytes. 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 Urinalysis was done and showed cloudy appearance with greater than 500 glucose, small hemoglobin, 5 ketones, large leukocytes, negative nitrites, rare bacteria and greater than 50 RBCs per high-power field and greater than 50 WBCs.  Blood cultures x 2 showed NGTD @ 3 Days; Urine culture showing 50,000 CFU of Yeast.  CT scan showed decompressed bladder with persistent diffuse wall thickening. Had Initiated IV Ceftriaxone  while awaiting Cx Results but suspect colonization and Funguria/ Asymptomatic Candiuria that is asymptomatic so will stop Abx. WBC went from 11.7 -> 9.3 -> 8.9 -> 7.3 on last check. Changed Foley Catheter this Admission.   T12 Compression Deformity: CT Scan done and showed new mild superior endplate compression deformity at T12 without osseous retropulsion or significant paraspinal soft tissue abnormality. Pain control and  Heating pad. Need PT/OT to Evaluate and Treat and they are recommending SNF. He is medically  stable for D/C as he has no real complaints today of pain   Hypokalemia: K+ is now 3.6 on last check. CTM and Replete as Necessary. Repeat CMP within 1 week  Hypomagnesemia: Mag  Level is now 1.9 on last check. CTM and Replete as Necessary. Repeat Mag Level within 1 week   Chronic Systolic CHF: Appeared hypovolemic on admission and therapies had been limited by hypotension. Continue to hold Furosemide . Was cautiously hydrated w/ IVF but now stopped as he has improved. Strict I's and O's and Daily Weights.  Intake/Output Summary (Last 24 hours) at 01/01/2024 1403 Last data filed at 01/01/2024 9052 Gross per 24 hour  Intake 477 ml  Output 350 ml  Net 127 ml  -CTM for S/Sx of Volume Overload; appears euvolemic at this time and can resume diuretics at D/C   Diabetes Mellitus Type 2: A1c was 7.5% in April 2025; Changed Very Sensitive Novolog  SSI q4h to Sensitive Novolog  SSI AC/HS but now will go to Moderate Scale. CTM CBGs per protocol. CBG Trend ranging from 194-285 on the last 7 checks. Diabetes Education Coordinator consulted and the patient was started on Semglee  10 units every day and will increase to 15 units daily   CAD: Currently has No anginal symptoms; C/w Clopidogrel  75 mg po Dialy and Atorvastatin  80 mg po qHS  HLD: C/w Atorvastatin  80 m po qHS   Depression: C/w Esctialopram 10 mg po Daily   Hyponatremia: Mild and Improved. Na+ went from 134 -> 137 -> 135 -> 136 on last check. CTM and Trend and repeat CMP within 1 week   Breast Cancer: Follows with [facility] and is s/p mastectomy and chemotherapy; stopped Tamoxifen  early due to PE    Hx of VTE:  Was considered provoked and he completed treatment    Cognitive Impairment: C/w Delirium Precautions   Constipation: Changed Bowel Regimen to Senna-Docusate 1 tab po BID, Miralax  17 grams po BID, and add Bisacodyl  10 mg Rcprn. Checked KUB on 9/18 and showed Diminished colonic stool from recent CT. Small volume of formed stool persists. No small bowel distension or evidence of obstruction. Low pelvis including rectum not included in the field of view. Gallstones on CT are not well seen by radiograph. Multiple pelvic  phleboliths.  Continue with bowel regimen at facility; Received a Fleet Enema 9/20 and had a very large Bowel movement.   Hyperbilirubinemia: Mild. T Bili is now 2.0 on last check. CTM and Trend and repeat CMP within 1 week  Normocytic Anemia: Hgb/Hct Trend Stable:  Recent Labs  Lab 12/22/23 0459 12/26/23 1040 12/26/23 1112 12/27/23 0458 12/28/23 0526 12/29/23 0535 12/30/23 0506  HGB 11.5* 12.6* 13.3 11.8* 11.8* 12.2* 12.9*  HCT 35.1* 39.1 39.0 36.5* 35.6* 37.9* 39.4  MCV 88.2 86.7  --  86.9 86.4 86.5 86.2  -Checked Anemia Panel showed an iron level 39, UIBC 2 1, TIBC 239, saturation ratio 60%, ferritin 157, folate level of 13.9. CTM for S/Sx of Bleeding; No overt bleeding noted. Repeat CBC within 1 week  Overweight: Complicates overall prognosis and care. Estimated body mass index is 24.49 kg/m as calculated from the following:   Height as of this encounter: 6' (1.829 m).   Weight as of this encounter: 81.9 kg. Weight Loss and Dietary Counseling given  Consultants: None Procedures performed: As delineated as above  Disposition: Skilled nursing facility  Diet recommendation:  Cardiac and Carb modified diet DISCHARGE MEDICATION: Allergies as of 01/02/2024       Reactions  Ramipril Anaphylaxis   Adhesive [tape] Rash   Other Diarrhea   Severe intolerance to Chemotherapy in the past.   Sertraline Other (See Comments)   Extreme headaches        Medication List     STOP taking these medications    amoxicillin -clavulanate 875-125 MG tablet Commonly known as: AUGMENTIN    loperamide  2 MG tablet Commonly known as: IMODIUM  A-D   Magnesium  Citrate 200 MG Tabs       TAKE these medications    acetaminophen  325 MG tablet Commonly known as: TYLENOL  Take 2 tablets (650 mg total) by mouth every 6 (six) hours as needed for mild pain (pain score 1-3) or fever (or Fever >/= 101).   atorvastatin  80 MG tablet Commonly known as: LIPITOR  Take 1 tablet (80 mg total) by  mouth at bedtime.   clopidogrel  75 MG tablet Commonly known as: PLAVIX  Take 1 tablet (75 mg total) by mouth daily with breakfast.   clotrimazole -betamethasone  cream Commonly known as: LOTRISONE  Apply 1 Application topically daily. What changed:  when to take this reasons to take this   cyanocobalamin  1000 MCG tablet Commonly known as: VITAMIN B12 Take 1 tablet (1,000 mcg total) by mouth daily. What changed: when to take this   escitalopram  10 MG tablet Commonly known as: Lexapro  Take 1 tablet (10 mg total) by mouth daily.   FeroSul 325 (65 Fe) MG tablet Generic drug: ferrous sulfate  Take 1 tablet (325 mg total) by mouth daily with breakfast.   finasteride  5 MG tablet Commonly known as: PROSCAR  Take 1 tablet (5 mg total) by mouth daily. *Need appointment for future refills.*   hyoscyamine  0.125 MG SL tablet Commonly known as: LEVSIN  SL Place 1 tablet (0.125 mg total) under the tongue every 4 (four) hours as needed for bladder spasms.   insulin  glargine 100 UNIT/ML injection Commonly known as: LANTUS  Inject 0.15 mLs (15 Units total) into the skin daily.   losartan  25 MG tablet Commonly known as: COZAAR  Take 1 tablet (25 mg total) by mouth daily.   metFORMIN  500 MG tablet Commonly known as: GLUCOPHAGE  Take 1 tablet (500 mg total) by mouth 2 (two) times daily with a meal.   multivitamin tablet Take 1 tablet by mouth 2 (two) times a week.   ondansetron  4 MG disintegrating tablet Commonly known as: ZOFRAN -ODT Take 1 tablet (4 mg total) by mouth every 8 (eight) hours as needed for nausea or vomiting.   pantoprazole  40 MG tablet Commonly known as: PROTONIX  Take 1 tablet (40 mg total) by mouth at bedtime.   polyethylene glycol 17 g packet Commonly known as: MIRALAX  / GLYCOLAX  Take 17 g by mouth daily.   Stool Softener/Laxative 50-8.6 MG tablet Generic drug: senna-docusate Take 1 tablet by mouth 2 (two) times daily. What changed:  when to take this reasons to  take this   vitamin C 1000 MG tablet Take 1,000 mg by mouth 2 (two) times a week.               Durable Medical Equipment  (From admission, onward)           Start     Ordered   12/30/23 1248  For home use only DME Hospital bed  Once       Question Answer Comment  Length of Need 6 Months   Patient has (list medical condition): Recurrent Nausea and Vomiting, Abdominal Pain   The above medical condition requires: Patient requires the ability to reposition frequently  Bed type Semi-electric   Support Surface: Low Air loss Mattress      12/30/23 1247            Contact information for after-discharge care     Destination     [facility] .   Service: Skilled Nursing Contact information: [address] [place]  [address] [phone]                    Discharge Exam: Filed Weights   12/30/23 0500 12/31/23 0717 01/01/24 0615  Weight: 78.4 kg 80.7 kg 81.9 kg   Vitals:   01/01/24 2009 01/02/24 0456  BP: 132/71 120/82  Pulse: 88 88  Resp: 18 18  Temp: 99.1 F (37.3 C) 98.2 F (36.8 C)  SpO2: 96% 96%   Examination: Physical Exam:  Constitutional: Elderly overweight Caucasian male in no acute distress Respiratory: Diminished to auscultation bilaterally, no wheezing, rales, rhonchi or crackles. Normal respiratory effort and patient is not tachypenic. No accessory muscle use.  Unlabored breathing Cardiovascular: RRR, no murmurs / rubs / gallops. S1 and S2 auscultated. No extremity edema. Abdomen: Soft, non-tender, non-distended. Bowel sounds positive.  GU: Deferred. Musculoskeletal: No clubbing / cyanosis of digits/nails. No joint deformity upper and lower extremities. Skin: No rashes, lesions, ulcers on limited skin evaluation. No induration; Warm and dry.  Neurologic: CN 2-12 grossly intact with no focal deficits. Romberg sign and cerebellar reflexes not assessed.  Psychiatric: Awake and alert  Condition at discharge:  stable  The results of significant diagnostics from this hospitalization (including imaging, microbiology, ancillary and laboratory) are listed below for reference.   Imaging Studies: DG Abd 1 View Result Date: 12/29/2023 CLINICAL DATA:  Constipation. EXAM: ABDOMEN - 1 VIEW COMPARISON:  CT 12/26/2023 FINDINGS: Diminished colonic stool from recent CT. Small volume of formed stool persists. No small bowel distension or evidence of obstruction. Low pelvis including rectum not included in the field of view. Gallstones on CT are not well seen by radiograph. Multiple pelvic phleboliths. IMPRESSION: Diminished colonic stool from recent CT. Small volume of formed stool persists. Electronically Signed   By: Andrea Gasman M.D.   On: 12/29/2023 10:23   CT ABDOMEN PELVIS W CONTRAST Result Date: 12/26/2023 CLINICAL DATA:  Abdominal pain, acute, nonlocalized EXAM: CT ABDOMEN AND PELVIS WITH CONTRAST TECHNIQUE: Multidetector CT imaging of the abdomen and pelvis was performed using the standard protocol following bolus administration of intravenous contrast. RADIATION DOSE REDUCTION: This exam was performed according to the departmental dose-optimization program which includes automated exposure control, adjustment of the mA and/or kV according to patient size and/or use of iterative reconstruction technique. CONTRAST:  OMNIPAQUE  IOHEXOL  300 MG/ML  SOLN COMPARISON:  Multiple priors, most recently obtained 12/19/2023 and 12/13/2023. 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 There is mild atelectasis at both lung bases. No significant pleural effusion or basilar pneumothorax. Hepatobiliary: The liver is normal in density without suspicious focal abnormality. Small calcified gallstones are again noted. The gallbladder remains distended, although demonstrates no wall thickening or surrounding inflammation. There is no significant biliary dilatation. Pancreas: Mild  atrophy. No pancreatic ductal dilatation or surrounding inflammation. Spleen: Scattered calcified granulomas. The spleen is normal in size without other focal abnormality. Adrenals/Urinary Tract: Both adrenal glands appear normal. No evidence of urinary tract calculus, suspicious renal lesion or hydronephrosis. A Foley catheter remains in place. The bladder is decompressed with diffuse wall thickening, similar to previous study. Stomach/Bowel: No enteric contrast  administered. Mild distal esophageal wall thickening. The stomach appears unremarkable for its degree of distention. No bowel distension, wall thickening or surrounding inflammation identified. The appendix is tendon of Leigh identified and appears normal. There is mildly prominent stool throughout the colon. Vascular/Lymphatic: There are no enlarged abdominal or pelvic lymph nodes. Mild aortic and branch vessel atherosclerosis. No evidence of aneurysm or large vessel occlusion. Reproductive: Stable mild enlargement of the prostate gland. Other: No evidence of abdominal wall mass or hernia. No ascites or pneumoperitoneum. 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 New mild superior endplate compression deformity at T12 without osseous retropulsion or significant paraspinal soft tissue abnormality. IMPRESSION: 1. No acute intra-abdominal findings or explanation for the patient's symptoms. 2. New mild superior endplate compression deformity at T12 without osseous retropulsion or significant paraspinal soft tissue abnormality. Correlate for point tenderness and recent injury. 3. Cholelithiasis without evidence of cholecystitis or biliary dilatation. 4. Decompressed bladder with persistent diffuse wall thickening. 5. Mildly prominent stool throughout the colon suggesting constipation. 6.  Aortic Atherosclerosis (ICD10-I70.0). Electronically Signed   By: Elsie Perone M.D.   On: 12/26/2023 12:01   CT ABDOMEN  PELVIS W CONTRAST Result Date: 12/19/2023 CLINICAL DATA:  Abdomen pain nausea vomiting EXAM: CT ABDOMEN AND PELVIS WITH CONTRAST TECHNIQUE: Multidetector CT imaging of the abdomen and pelvis was performed using the standard protocol following bolus administration of intravenous contrast. RADIATION DOSE REDUCTION: This exam was performed according to the departmental dose-optimization program which includes automated exposure control, adjustment of the mA and/or kV according to patient size and/or use of iterative reconstruction technique. CONTRAST:  OMNIPAQUE  IOHEXOL  300 MG/ML  SOLN COMPARISON:  CT 12/13/2023, 12/04/2023 FINDINGS: Lower chest: Lung bases demonstrate slight increased patchy airspace disease at the left base. Partially visualized cardiac pacing leads. Coronary vascular calcification. Hepatobiliary: Distended gallbladder with stones. No biliary dilatation or focal hepatic abnormality. Pancreas: Atrophic.  No inflammation or ductal dilatation Spleen: Granuloma Adrenals/Urinary Tract: Adrenal glands are normal. Kidneys show no hydronephrosis. Decompressed diffusely thick walled bladder with catheter. Stomach/Bowel: Stomach within normal limits. No dilated small bowel. No acute bowel wall thickening. Vascular/Lymphatic: Aortic atherosclerosis. No enlarged abdominal or pelvic lymph nodes. Reproductive: Enlarged prostate Other: Negative for pelvic effusion or free air. Musculoskeletal: No acute or suspicious osseous abnormality IMPRESSION: 1. No CT evidence for acute intra-abdominal or pelvic abnormality. 2. Distended gallbladder with stones. 3. Slight increased patchy airspace disease at the left base, atelectasis versus minimal pneumonia. 4. Enlarged prostate. Decompressed urinary bladder but with marked diffuse bladder wall thickening 5. Aortic atherosclerosis. Aortic Atherosclerosis (ICD10-I70.0). Electronically Signed   By: Luke Bun M.D.   On: 12/19/2023 22:30   CT Head Wo Contrast Result  Date: 12/19/2023 CLINICAL DATA:  Intermittent altered mental status for 2 weeks. EXAM: CT HEAD WITHOUT CONTRAST TECHNIQUE: Contiguous axial images were obtained from the base of the skull through the vertex without intravenous contrast. RADIATION DOSE REDUCTION: This exam was performed according to the departmental dose-optimization program which includes automated exposure control, adjustment of the mA and/or kV according to patient size and/or use of iterative reconstruction technique. COMPARISON:  December 13, 2023 FINDINGS: Brain: There is generalize cerebral atrophy with widening of the extra-axial spaces and ventricular dilatation. There are areas of decreased attenuation within the white matter tracts of the supratentorial brain, consistent with microvascular disease changes. Small, chronic bilateral basal ganglia lacunar infarcts are noted. Vascular: Marked severity bilateral cavernous carotid artery calcification is seen. Skull: Normal. Negative for fracture or  focal lesion. Sinuses/Orbits: No acute finding. Other: None. IMPRESSION: 1. Generalized cerebral atrophy with chronic white matter small vessel ischemic changes. 2. Small, chronic bilateral basal ganglia lacunar infarcts. 3. No acute intracranial abnormality. Electronically Signed   By: Suzen Dials M.D.   On: 12/19/2023 17:50   CT Head Wo Contrast Result Date: 12/13/2023 CLINICAL DATA:  Mental status change, unknown cause EXAM: CT HEAD WITHOUT CONTRAST TECHNIQUE: Contiguous axial images were obtained from the base of the skull through the vertex without intravenous contrast. RADIATION DOSE REDUCTION: This exam was performed according to the departmental dose-optimization program which includes automated exposure control, adjustment of the mA and/or kV according to patient size and/or use of iterative reconstruction technique. COMPARISON:  Head CT 12/04/2023 FINDINGS: Brain: No intracranial hemorrhage, mass effect, or midline shift. Stable  degree of atrophy and chronic small vessel ischemia. Chronic lacunar infarcts in the basal ganglia. No hydrocephalus. The basilar cisterns are patent. No evidence of territorial infarct or acute ischemia. No extra-axial or intracranial fluid collection. Vascular: Atherosclerosis of skullbase vasculature without hyperdense vessel or abnormal calcification. Skull: No fracture or focal lesion. Sinuses/Orbits: No acute findings.  Bilateral cataract resection Other: None. IMPRESSION: 1. No acute intracranial abnormality. 2. 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 Electronically Signed   By: Andrea Gasman M.D.   On: 12/13/2023 15:37   CT ABDOMEN PELVIS W CONTRAST Result Date: 12/13/2023 EXAM: CT ABDOMEN AND PELVIS WITH CONTRAST 12/13/2023 02:39:23 PM TECHNIQUE: CT of the abdomen and pelvis was performed with the administration of intravenous contrast. Multiplanar reformatted images are provided for review. Automated exposure control, iterative reconstruction, and/or weight-based adjustment of the mA/kV was utilized to reduce the radiation dose to as low as reasonably achievable. COMPARISON: 12/04/2023 CLINICAL HISTORY: Bowel obstruction suspected. Pt bib EMS from home. Pt was at [facility] dc Sunday after sepsis. Yesterday vomiting. Today constipated x1 wk possibly. Refused meds for constipation while at [facility]. Tried to put a plate in the toaster so family thinks pt is confused. Pt refused to answer ; A\T\O questions. Foley in place. Hx Diabetes. FINDINGS: LOWER CHEST: Trace bilateral pleural effusions. Transvenous pacing leads partially visualized, grossly stable. LIVER: Trace perihepatic fluid. GALLBLADDER AND BILE DUCTS: Gallbladder distended, with several layering partially calcified stones measuring up to 1.3 cm. No definite wall thickening or regional inflammatory change. No biliary ductal dilatation. SPLEEN: Normal size with scattered calcified punctate granulomas. PANCREAS: Diffuse pancreatic parenchymal  atrophy without ductal dilatation, mass, or regional inflammatory change. ADRENAL GLANDS: Normal appearance. No mass. KIDNEYS, URETERS AND BLADDER: Urinary bladder decompressed by Foley catheter, appears diffusely thick walled. No stones in the kidneys or ureters. No hydronephrosis. No perinephric or periureteral stranding. GI AND BOWEL: Stomach demonstrates no acute abnormality. There is no bowel obstruction. No bowel wall thickening. PERITONEUM AND RETROPERITONEUM: No free air. VASCULATURE: Scattered calcified aortoiliac atheromatous plaque without aneurysm or stenosis. LYMPH NODES: No lymphadenopathy. REPRODUCTIVE ORGANS: Prostate enlargement with scattered coarse calcifications. BONES AND SOFT TISSUES: Bilateral hip degenerative joint disease. Bilateral pelvic phleboliths. No acute osseous abnormality. No focal soft tissue abnormality. IMPRESSION: 1. No evidence of bowel obstruction or other acute findings . 2. Cholelithiasis Electronically signed by: Katheleen Faes MD 12/13/2023 03:29 PM EDT RP Workstation: [id number]   DG Chest Portable 1 View Result Date: 12/13/2023 CLINICAL DATA:  Altered mental status EXAM: PORTABLE CHEST 1 VIEW COMPARISON:  12/04/2023 FINDINGS: The patient is rotated to the left on today's radiograph, reducing diagnostic sensitivity and specificity. Dual lead pacer noted. Atherosclerotic calcification of the  aortic arch. Heart size currently within normal limits. The lungs appear clear.  Mild thoracic spondylosis. IMPRESSION: 1. No acute findings. 2. Aortic Atherosclerosis (ICD10-I70.0). Electronically Signed   By: Ryan Salvage M.D.   On: 12/13/2023 12:23   ECHO TEE Result Date: 12/08/2023    TRANSESOPHOGEAL ECHO REPORT   Patient Name:   OAKLAND FANT Whitmore Date of Exam: 12/08/2023 Medical Rec #:  [id number]        Height:       72.0 in Accession #:    [id number]       Weight:       191.8 lb Date of Birth:  04-13-1945        BSA:          2.093 m Patient Age:    78 years         BP:            167/82 mmHg Patient Gender: M                HR:           60 bpm. Exam Location:  Inpatient Procedure: Transesophageal Echo, Cardiac Doppler and Color Doppler (Both            Spectral and Color Flow Doppler were utilized during procedure). Indications:     Endocarditis  History:         Patient has prior history of Echocardiogram examinations, most                  recent 08/11/2023. HFrEF and CHF, CAD, Chronic Kidney Disease;                  Risk Factors:Diabetes, Dyslipidemia and Hypertension.  Sonographer:     Koleen Popper RDCS Referring Phys:  [id number] LEONTINE SAILOR LOCKWOOD Diagnosing Phys: Annabella Scarce MD PROCEDURE: After discussion of the risks and benefits of a TEE, an informed consent was obtained from the patient. The transesophogeal probe was passed without difficulty through the esophogus of the patient. Imaged were obtained with the patient in a left lateral decubitus position. Sedation performed by different physician. The patient was monitored while under deep sedation. Anesthestetic sedation was provided intravenously by Anesthesiology: 60mg  of Propofol , 20mg  of Lidocaine . The patient's vital  signs; including heart rate, blood pressure, and oxygen saturation; remained stable throughout the procedure. The patient developed no complications during the procedure.  IMPRESSIONS  1. Left ventricular ejection fraction, by estimation, is 30 to 35%. The left ventricle has moderately decreased function. The left ventricle demonstrates global hypokinesis.  2. Right ventricular systolic function is normal. The right ventricular size is normal.  3. No left atrial/left atrial appendage thrombus was detected.  4. The mitral valve is normal in structure. Mild mitral valve regurgitation. No evidence of mitral stenosis. There is mild prolapse of the middle scallop of the posterior leaflet of the mitral valve.  5. The aortic valve is tricuspid. Aortic valve regurgitation is trivial. No aortic stenosis is  present.  6. There is mild (Grade II) protruding plaque involving the aortic root. Conclusion(s)/Recommendation(s): No evidence of vegetation/infective endocarditis on this transesophageael echocardiogram. FINDINGS  Left Ventricle: Left ventricular ejection fraction, by estimation, is 30 to 35%. The left ventricle has moderately decreased function. The left ventricle demonstrates global hypokinesis. The left ventricular internal cavity size was normal in size. There is no left ventricular hypertrophy. Right Ventricle: The right ventricular size is normal. No increase in right ventricular wall thickness. Right ventricular systolic  function is normal. Left Atrium: Left atrial size was normal in size. No left atrial/left atrial appendage thrombus was detected. Right Atrium: Right atrial size was normal in size. Pericardium: There is no evidence of pericardial effusion. Mitral Valve: The mitral valve is normal in structure. There is mild prolapse of the middle scallop of the posterior leaflet of the mitral valve. Mild mitral valve regurgitation. No evidence of mitral valve stenosis. Tricuspid Valve: The tricuspid valve is normal in structure. Tricuspid valve regurgitation is mild . No evidence of tricuspid stenosis. Aortic Valve: The aortic valve is tricuspid. Aortic valve regurgitation is trivial. No aortic stenosis is present. Pulmonic Valve: The pulmonic valve was normal in structure. Pulmonic valve regurgitation is not visualized. No evidence of pulmonic stenosis. Aorta: The aortic root is normal in size and structure. There is mild (Grade II) protruding plaque involving the aortic root. IAS/Shunts: No atrial level shunt detected by color flow Doppler. Additional Comments: Spectral Doppler performed. LEFT VENTRICLE PLAX 2D LVOT diam:     2.00 cm LVOT Area:     3.14 cm   AORTA Ao Root diam: 3.10 cm Ao Asc diam:  3.50 cm TRICUSPID VALVE TR Peak grad:   14.3 mmHg TR Vmax:        189.00 cm/s  SHUNTS Systemic Diam: 2.00  cm Annabella Scarce MD Electronically signed by Annabella Scarce MD Signature Date/Time: 12/08/2023/3:58:52 PM    Final    EP STUDY Result Date: 12/08/2023 See surgical note for result.  CT Angio Chest PE W and/or Wo Contrast Result Date: 12/04/2023 CLINICAL DATA:  Nausea and vomiting with chest and abdominal pain, initial encounter EXAM: CT ANGIOGRAPHY CHEST CT ABDOMEN AND PELVIS WITH CONTRAST TECHNIQUE: Multidetector CT imaging of the chest was performed using the standard protocol during bolus administration of intravenous contrast. Multiplanar CT image reconstructions and MIPs were obtained to evaluate the vascular anatomy. Multidetector CT imaging of the abdomen and pelvis was performed using the standard protocol during bolus administration of intravenous contrast. RADIATION DOSE REDUCTION: This exam was performed according to the departmental dose-optimization program which includes automated exposure control, adjustment of the mA and/or kV according to patient size and/or use of iterative reconstruction technique. CONTRAST:  75mL OMNIPAQUE  IOHEXOL  350 MG/ML SOLN COMPARISON:  None Available. FINDINGS: CTA CHEST FINDINGS Cardiovascular: Thoracic aorta shows atherosclerotic calcifications without aneurysmal dilatation or dissection. No cardiac enlargement is seen. Coronary calcifications are noted. The pulmonary artery shows a normal branching pattern bilaterally. No filling defect to suggest pulmonary embolism is noted. Mediastinum/Nodes: Thoracic inlet is within normal limits. No hilar or mediastinal adenopathy is noted. The esophagus as visualized is within normal limits. Lungs/Pleura: Lungs are well aerated bilaterally. No focal infiltrate or sizable effusion is seen. Persistent pleural based density is noted in the anterolateral aspect of the right upper lobe best seen on image number 34 of series 9 stable from the prior exam and likely related to prior radiation therapy. Some scarring is noted in the  right lung base. No focal nodule is seen. Musculoskeletal: Degenerative changes of the thoracic spine are noted. Review of the MIP images confirms the above findings. CT ABDOMEN and PELVIS FINDINGS Hepatobiliary: Dependent gallstones are noted. No wall thickening or pericholecystic fluid is noted. The liver is within normal limits. Pancreas: Unremarkable. No pancreatic ductal dilatation or surrounding inflammatory changes. Spleen: Scattered calcified granulomas are noted. Adrenals/Urinary Tract: The adrenal glands again shows some nodular thickening although no discrete lesion is noted. The kidneys demonstrate a normal enhancement pattern bilaterally.  No renal calculi or obstructive changes are seen. The bladder is decompressed by Foley catheter. Stomach/Bowel: Colon is predominately decompressed. No inflammatory changes are seen. The appendix is not well visualized and may have been surgically removed. No inflammatory changes to suggest appendicitis are noted. Stomach and small bowel are within normal limits. Vascular/Lymphatic: Aortic atherosclerosis. No enlarged abdominal or pelvic lymph nodes. Reproductive: Prostate is unremarkable. Other: No abdominal wall hernia or abnormality. No abdominopelvic ascites. Musculoskeletal: Mild degenerative changes of lumbar spine are noted. Review of the MIP images confirms the above findings. IMPRESSION: CTA of the chest: No evidence of pulmonary embolism. Chronic scarring in the right lung. CT of the abdomen and pelvis: Cholelithiasis without complicating factors. Changes of prior granulomatous disease. No acute abnormality is seen. Electronically Signed   By: Oneil Devonshire M.D.   On: 12/04/2023 21:25   CT ABDOMEN PELVIS W CONTRAST Result Date: 12/04/2023 CLINICAL DATA:  Nausea and vomiting with chest and abdominal pain, initial encounter EXAM: CT ANGIOGRAPHY CHEST CT ABDOMEN AND PELVIS WITH CONTRAST TECHNIQUE: Multidetector CT imaging of the chest was performed using the  standard protocol during bolus administration of intravenous contrast. Multiplanar CT image reconstructions and MIPs were obtained to evaluate the vascular anatomy. Multidetector CT imaging of the abdomen and pelvis was performed using the standard protocol during bolus administration of intravenous contrast. RADIATION DOSE REDUCTION: This exam was performed according to the departmental dose-optimization program which includes automated exposure control, adjustment of the mA and/or kV according to patient size and/or use of iterative reconstruction technique. CONTRAST:  75mL OMNIPAQUE  IOHEXOL  350 MG/ML SOLN COMPARISON:  None Available. FINDINGS: CTA CHEST FINDINGS Cardiovascular: Thoracic aorta shows atherosclerotic calcifications without aneurysmal dilatation or dissection. No cardiac enlargement is seen. Coronary calcifications are noted. The pulmonary artery shows a normal branching pattern bilaterally. No filling defect to suggest pulmonary embolism is noted. Mediastinum/Nodes: Thoracic inlet is within normal limits. No hilar or mediastinal adenopathy is noted. The esophagus as visualized is within normal limits. Lungs/Pleura: Lungs are well aerated bilaterally. No focal infiltrate or sizable effusion is seen. Persistent pleural based density is noted in the anterolateral aspect of the right upper lobe best seen on image number 34 of series 9 stable from the prior exam and likely related to prior radiation therapy. Some scarring is noted in the right lung base. No focal nodule is seen. Musculoskeletal: Degenerative changes of the thoracic spine are noted. Review of the MIP images confirms the above findings. CT ABDOMEN and PELVIS FINDINGS Hepatobiliary: Dependent gallstones are noted. No wall thickening or pericholecystic fluid is noted. The liver is within normal limits. Pancreas: Unremarkable. No pancreatic ductal dilatation or surrounding inflammatory changes. Spleen: Scattered calcified granulomas are  noted. Adrenals/Urinary Tract: The adrenal glands again shows some nodular thickening although no discrete lesion is noted. The kidneys demonstrate a normal enhancement pattern bilaterally. No renal calculi or obstructive changes are seen. The bladder is decompressed by Foley catheter. Stomach/Bowel: Colon is predominately decompressed. No inflammatory changes are seen. The appendix is not well visualized and may have been surgically removed. No inflammatory changes to suggest appendicitis are noted. Stomach and small bowel are within normal limits. Vascular/Lymphatic: Aortic atherosclerosis. No enlarged abdominal or pelvic lymph nodes. Reproductive: Prostate is unremarkable. Other: No abdominal wall hernia or abnormality. No abdominopelvic ascites. Musculoskeletal: Mild degenerative changes of lumbar spine are noted. Review of the MIP images confirms the above findings. IMPRESSION: CTA of the chest: No evidence of pulmonary embolism. Chronic scarring in the right lung. CT  of the abdomen and pelvis: Cholelithiasis without complicating factors. Changes of prior granulomatous disease. No acute abnormality is seen. Electronically Signed   By: Oneil Devonshire M.D.   On: 12/04/2023 21:25   CT Head Wo Contrast Result Date: 12/04/2023 CLINICAL DATA:  Delirium EXAM: CT HEAD WITHOUT CONTRAST TECHNIQUE: Contiguous axial images were obtained from the base of the skull through the vertex without intravenous contrast. RADIATION DOSE REDUCTION: This exam was performed according to the departmental dose-optimization program which includes automated exposure control, adjustment of the mA and/or kV according to patient size and/or use of iterative reconstruction technique. COMPARISON:  CT head 10/26/2023 FINDINGS: Brain: Cerebral ventricle sizes are concordant with the degree of cerebral volume loss. Patchy and confluent areas of decreased attenuation are noted throughout the deep and periventricular white matter of the cerebral  hemispheres bilaterally, compatible with chronic microvascular ischemic disease. No evidence of large-territorial acute infarction. No parenchymal hemorrhage. No mass lesion. No extra-axial collection. No mass effect or midline shift. No hydrocephalus. Basilar cisterns are patent. Vascular: No hyperdense vessel. Atherosclerotic calcifications are present within the cavernous internal carotid arteries. Skull: No acute fracture or focal lesion. Sinuses/Orbits: Paranasal sinuses and mastoid air cells are clear. The orbits are unremarkable. Other: None. IMPRESSION: No acute intracranial abnormality. Electronically Signed   By: Morgane  Naveau M.D.   On: 12/04/2023 21:21   DG Chest Port 1 View Result Date: 12/04/2023 CLINICAL DATA:  Vomiting. 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 The heart size and mediastinal contours are within normal limits. Mild calcification of the aortic arch is noted. Both lungs are clear. The visualized skeletal structures are unremarkable. IMPRESSION: No active disease. Electronically Signed   By: Suzen Dials M.D.   On: 12/04/2023 20:05   Microbiology: Results for orders placed or performed during the hospital encounter of 12/26/23  Blood culture (routine x 2)     Status: None   Collection Time: 12/26/23 10:44 AM   Specimen: BLOOD LEFT FOREARM  Result Value Ref Range Status   Specimen Description   Final    BLOOD LEFT FOREARM Performed at [facility], [address], [place], [place] [address]    Special Requests   Final    BOTTLES DRAWN AEROBIC AND ANAEROBIC Blood Culture results may not be optimal due to an inadequate volume of blood received in culture bottles Performed at [facility], [address], [place], [place] [address]    Culture   Final    NO GROWTH 5 DAYS Performed at [facility], [address] [place], [place] [address]    Report Status 12/31/2023  FINAL  Final  Blood culture (routine x 2)     Status: None   Collection Time: 12/26/23 10:49 AM   Specimen: BLOOD RIGHT FOREARM  Result Value Ref Range Status   Specimen Description   Final    BLOOD RIGHT FOREARM Performed at [facility], [address] [place], [place] [address]    Special Requests   Final    BOTTLES DRAWN AEROBIC AND ANAEROBIC Blood Culture results may not be optimal due to an inadequate volume of blood received in culture bottles Performed at [facility], [address] [place], [place] [address]    Culture   Final    NO GROWTH 5 DAYS Performed at [facility], [address], [place], [place] [address]  Report Status 12/31/2023 FINAL  Final  Urine Culture     Status: Abnormal   Collection Time: 12/26/23 10:53 AM   Specimen: Urine, Random  Result Value Ref Range Status   Specimen Description   Final    URINE, RANDOM Performed at [facility], [address], [place], [place] [address]    Special Requests   Final    NONE Reflexed from [id number] Performed at [facility], [address], [place], [place] [address]    Culture 50,000 COLONIES/mL YEAST (A)  Final   Report Status 12/27/2023 FINAL  Final   Labs: CBC: Recent Labs  Lab 12/27/23 0458 12/28/23 0526 12/29/23 0535 12/30/23 0506 01/01/24 1913  WBC 9.3 8.9 7.3 9.1 9.8  NEUTROABS  --  6.0 4.5  --  6.3  HGB 11.8* 11.8* 12.2* 12.9* 12.7*  HCT 36.5* 35.6* 37.9* 39.4 40.3  MCV 86.9 86.4 86.5 86.2 91.4  PLT 240 230 273 287 319   Basic Metabolic Panel: Recent Labs  Lab 12/26/23 1112 12/27/23 0458 12/28/23 0526 12/29/23 0535 01/01/24 1913  NA 134* 137 135 136 133*  K 3.6 3.7 3.3* 3.6 3.8  CL 95* 97* 96* 97* 95*  CO2  --  26 25 26 24   GLUCOSE 300* 210* 217* 205* 240*  BUN 8 12 11 8 21   CREATININE 0.70 0.68 0.64 0.75 1.08  CALCIUM   --  9.7 9.3 9.8 9.8  MG  --  1.4* 1.8 1.9 1.9  PHOS  --  3.6 2.8 3.2 3.8   Liver  Function Tests: Recent Labs  Lab 12/27/23 0458 12/28/23 0526 12/29/23 0535 01/01/24 1913  AST 16 15 21 23   ALT 18 16 17 21   ALKPHOS 68 69 73 81  BILITOT 2.0* 2.1* 2.0* 1.9*  PROT 5.7* 5.5* 5.9* 5.5*  ALBUMIN 3.6 3.5 3.5 3.4*   CBG: Recent Labs  Lab 01/01/24 0716 01/01/24 1139 01/01/24 1823 01/01/24 2100 01/02/24 0800  GLUCAP 203* 260* 201* 180* 160*   Discharge time spent: greater than 30 minutes.  Signed: Alejandro Marker, DO [facility] 01/02/2024

## 2023-12-30 NOTE — Plan of Care (Signed)
  Problem: Clinical Measurements: Goal: Ability to maintain clinical measurements within normal limits will improve Outcome: Progressing Goal: Will remain free from infection Outcome: Progressing Goal: Diagnostic test results will improve Outcome: Progressing Goal: Respiratory complications will improve Outcome: Progressing Goal: Cardiovascular complication will be avoided Outcome: Progressing   Problem: Activity: Goal: Risk for activity intolerance will decrease Outcome: Progressing   Problem: Nutrition: Goal: Adequate nutrition will be maintained Outcome: Progressing   Problem: Coping: Goal: Level of anxiety will decrease Outcome: Progressing   Problem: Elimination: Goal: Will not experience complications related to bowel motility Outcome: Progressing Goal: Will not experience complications related to urinary retention Outcome: Progressing   Problem: Pain Managment: Goal: General experience of comfort will improve and/or be controlled Outcome: Progressing   Problem: Safety: Goal: Ability to remain free from injury will improve Outcome: Progressing   Problem: Skin Integrity: Goal: Risk for impaired skin integrity will decrease Outcome: Progressing   Problem: Education: Goal: Individualized Educational Video(s) Outcome: Progressing   Problem: Coping: Goal: Ability to adjust to condition or change in health will improve Outcome: Progressing   Problem: Fluid Volume: Goal: Ability to maintain a balanced intake and output will improve Outcome: Progressing   Problem: Health Behavior/Discharge Planning: Goal: Ability to identify and utilize available resources and services will improve Outcome: Progressing Goal: Ability to manage health-related needs will improve Outcome: Progressing   Problem: Metabolic: Goal: Ability to maintain appropriate glucose levels will improve Outcome: Progressing   Problem: Nutritional: Goal: Maintenance of adequate nutrition will  improve Outcome: Progressing Goal: Progress toward achieving an optimal weight will improve Outcome: Progressing   Problem: Skin Integrity: Goal: Risk for impaired skin integrity will decrease Outcome: Progressing   Problem: Tissue Perfusion: Goal: Adequacy of tissue perfusion will improve Outcome: Progressing   Problem: Education: Goal: Knowledge of General Education information will improve Description: Including pain rating scale, medication(s)/side effects and non-pharmacologic comfort measures Outcome: Not Progressing   Problem: Health Behavior/Discharge Planning: Goal: Ability to manage health-related needs will improve Outcome: Not Progressing   Problem: Education: Goal: Ability to describe self-care measures that may prevent or decrease complications (Diabetes Survival Skills Education) will improve Outcome: Not Progressing

## 2023-12-30 NOTE — Progress Notes (Signed)
 Physical Therapy Treatment Patient Details Name: John [name] MRN: [id number] DOB: 05-09-1945 Today's Date: 12/30/2023   History of Present Illness Pt is 78 yo male admitted on 12/26/23 with nausea and vomiting, possible UTI, and T12 compression deformity.  Pt recently d/c from hospital on 9/11 and doing fairly well until abdominal pain returned and he fell on 9/13.  Pt with hx including but not limited to HTN, HLD, DM2, CVA, cognitive deficits, CAD, CHF, DVT, BPH, urinary retention with foley catheter.    PT Comments  Pt not able to ambulate today and with flat affect.  Pt transferred with mod A, stood with min A of 2 for safety, but then sat back down-initially just holding up finger and not stating why. Tried to encourage pt for more activity but he started laying down.  Did eventually report pain and dizzy in standing.  BP elevated in supine.  Continue POC. Patient will benefit from continued inpatient follow up therapy, <3 hours/day     If plan is discharge home, recommend the following: A lot of help with walking and/or transfers;A lot of help with bathing/dressing/bathroom;Assistance with cooking/housework;Help with stairs or ramp for entrance   Can travel by private vehicle     Yes  Equipment Recommendations  None recommended by PT    Recommendations for Other Services       Precautions / Restrictions Precautions Precautions: Fall;Back Precaution/Restrictions Comments: back precautions for comfort     Mobility  Bed Mobility Overal bed mobility: Needs Assistance Bed Mobility: Rolling, Sidelying to Sit Rolling: Min assist Sidelying to sit: Mod assist, +2 for safety/equipment     Sit to sidelying: Mod assist, +2 for safety/equipment General bed mobility comments: Cues for log roll technique; increased time; did go to L side (reports R side stronger, try R next)    Transfers Overall transfer level: Needs assistance Equipment used: Rolling walker (2 wheels) Transfers:  Sit to/from Stand Sit to Stand: Min assist, +2 safety/equipment, From elevated surface          Lateral/Scoot Transfers: Min assist General transfer comment: Cues for hand placement , bed elevated significantly - STS x 1 from EOB and then lateral scoots at EOB with cues    Ambulation/Gait               General Gait Details: Not able to take steps.  Pt stood and stayed for about 30 sec then quietly returned to sitting and started laying down stating I can't  Tried to have pt elborate but unable.  Did get him to lateral scoot with max cues.  Once in supine did report was dizzy and in pain.  BP was 194/90 (notified RN of elevated BP.   Stairs             Wheelchair Mobility     Tilt Bed    Modified Rankin (Stroke Patients Only)       Balance                                            Communication Communication Factors Affecting Communication: Hearing impaired  Cognition Arousal: Alert Behavior During Therapy: WFL for tasks assessed/performed   PT - Cognitive impairments: History of cognitive impairments, Problem solving, Awareness, Safety/Judgement                       PT -  Cognition Comments: Pt oriented x 3, does think his wife is coming to take him home today but plan is for SNF (seemed shocked when mentioned); flat affect and slow to respond/no response at times Following commands: Impaired Following commands impaired: Follows one step commands inconsistently    Cueing    Exercises      General Comments        Pertinent Vitals/Pain Pain Assessment Pain Assessment: Faces Faces Pain Scale: Hurts even more Pain Location: back with transfers Pain Descriptors / Indicators: Discomfort Pain Intervention(s): Limited activity within patient's tolerance, Monitored during session, Repositioned (RN reports pt declined meds)    Home Living                          Prior Function            PT Goals (current  goals can now be found in the care plan section) Progress towards PT goals: Progressing toward goals    Frequency    Min 2X/week      PT Plan      Co-evaluation              AM-PAC PT 6 Clicks Mobility   Outcome Measure  Help needed turning from your back to your side while in a flat bed without using bedrails?: A Little Help needed moving from lying on your back to sitting on the side of a flat bed without using bedrails?: A Lot Help needed moving to and from a bed to a chair (including a wheelchair)?: A Lot Help needed standing up from a chair using your arms (e.g., wheelchair or bedside chair)?: A Lot Help needed to walk in hospital room?: Total Help needed climbing 3-5 steps with a railing? : Total 6 Click Score: 11    End of Session Equipment Utilized During Treatment: Gait belt Activity Tolerance: Patient limited by pain;Other (comment) (Limited by pain, cognition, and pt declining further activity) Patient left: in bed;with bed alarm set;with call bell/phone within reach Nurse Communication: Mobility status;Other (comment) (BP) PT Visit Diagnosis: Unsteadiness on feet (R26.81);Difficulty in walking, not elsewhere classified (R26.2)     Time: 8498-8481 PT Time Calculation (min) (ACUTE ONLY): 17 min  Charges:    $Therapeutic Activity: 8-22 mins PT General Charges $$ ACUTE PT VISIT: 1 Visit                     Benjiman, PT Acute Rehab [facility] [phone]    Benjiman VEAR Mulberry 12/30/2023, 3:47 PM

## 2023-12-30 NOTE — Progress Notes (Signed)
 Patient refused all medications and glucose check. He stated he will not let us  program him.

## 2023-12-30 NOTE — TOC Progression Note (Signed)
 Transition of Care [facility]) - Progression Note   Patient Details  Name: John [name] MRN: [id number] Date of Birth: 05/21/1945  Transition of Care [facility]) CM/SW Contact  Duwaine GORMAN Aran, LCSW Phone Number: 12/30/2023, 1:40 PM  Clinical Narrative: CSW notified by [place] with [facility] that the facility cannot accommodate the patient and the bed offer has been withdrawn. CSW updated spouse. CSW explained that [facility] is the only bed offer the patient has received. Wife was not initially agreeable to [facility] and requested to go home with [facility] through [facility] and a hospital bed through [facility]. CSW received follow up call from spouse and she is now agreeable to [facility]. CSW confirmed bed with [facility] at [facility], but it will not be available until 01/02/24 at the earliest. CSW called [facility] to update the facility for insurance authorization. CSW was informed the authorization will remain valid until 01/04/24 at 11:59pm. CSW updated spouse and hospitalist.  Expected Discharge Plan: Skilled Nursing Facility Barriers to Discharge: Other (must enter comment) [facility] bed not available until next week.)  Expected Discharge Plan and Services In-house Referral: Clinical Social Work Post Acute Care Choice: Skilled Nursing Facility Living arrangements for the past 2 months: Single Family Home             DME Arranged: N/A DME Agency: NA HH Arranged: NA HH Agency: NA  Social Drivers of Health (SDOH) Interventions SDOH Screenings   Food Insecurity: No Food Insecurity (12/27/2023)  Housing: Low Risk  (12/27/2023)  Transportation Needs: No Transportation Needs (12/27/2023)  Utilities: Not At Risk (12/27/2023)  Alcohol Screen: Low Risk  (01/27/2023)  Depression (PHQ2-9): Low Risk  (01/28/2023)  Financial Resource Strain: Low Risk  (10/03/2023)  Physical Activity: Inactive (10/03/2023)  Social Connections: Unknown (12/27/2023)  Recent Concern: Social Connections - Socially Isolated (12/05/2023)   Stress: No Stress Concern Present (10/03/2023)  Tobacco Use: Low Risk  (12/26/2023)   Readmission Risk Interventions    12/06/2023    4:03 PM 05/11/2023   12:52 PM 04/22/2023   12:12 PM  Readmission Risk Prevention Plan  Medication Screening   Complete  Transportation Screening Complete Complete Complete  PCP or Specialist Appt within 3-5 Days  Complete   HRI or Home Care Consult  Complete   Social Work Consult for Recovery Care Planning/Counseling  Complete   Palliative Care Screening  Not Applicable   Medication Review Oceanographer) Complete Complete   HRI or Home Care Consult Complete    SW Recovery Care/Counseling Consult Complete    Palliative Care Screening Not Applicable    Skilled Nursing Facility Not Applicable

## 2023-12-31 DIAGNOSIS — R339 Retention of urine, unspecified: Secondary | ICD-10-CM | POA: Diagnosis not present

## 2023-12-31 DIAGNOSIS — R112 Nausea with vomiting, unspecified: Secondary | ICD-10-CM | POA: Diagnosis not present

## 2023-12-31 DIAGNOSIS — I2510 Atherosclerotic heart disease of native coronary artery without angina pectoris: Secondary | ICD-10-CM | POA: Diagnosis not present

## 2023-12-31 DIAGNOSIS — R4189 Other symptoms and signs involving cognitive functions and awareness: Secondary | ICD-10-CM | POA: Diagnosis not present

## 2023-12-31 LAB — GLUCOSE, CAPILLARY
Glucose-Capillary: 208 mg/dL — ABNORMAL HIGH (ref 70–99)
Glucose-Capillary: 207 mg/dL — ABNORMAL HIGH (ref 70–99)
Glucose-Capillary: 194 mg/dL — ABNORMAL HIGH (ref 70–99)
Glucose-Capillary: 285 mg/dL — ABNORMAL HIGH (ref 70–99)

## 2023-12-31 LAB — CULTURE, BLOOD (ROUTINE X 2)
Culture: NO GROWTH
Culture: NO GROWTH

## 2023-12-31 MED ORDER — SMOG ENEMA
960.0000 mL | Freq: Once | RECTAL | Status: DC
  Filled 2023-12-31: qty 960

## 2023-12-31 MED ORDER — BISACODYL 10 MG RE SUPP: 10.0000 mg | Freq: Once | RECTAL | Status: DC

## 2023-12-31 MED ORDER — ENOXAPARIN SODIUM 40 MG/0.4ML IJ SOSY
40.0000 mg | PREFILLED_SYRINGE | INTRAMUSCULAR | Status: DC
  Administered 2023-12-31 – 2024-01-01 (×2): 40 mg via SUBCUTANEOUS
  Filled 2023-12-31 (×2): qty 0.4

## 2023-12-31 NOTE — Plan of Care (Signed)
  Problem: Education: Goal: Knowledge of General Education information will improve Description: Including pain rating scale, medication(s)/side effects and non-pharmacologic comfort measures Outcome: Progressing   Problem: Clinical Measurements: Goal: Ability to maintain clinical measurements within normal limits will improve Outcome: Progressing Goal: Will remain free from infection Outcome: Progressing   Problem: Nutrition: Goal: Adequate nutrition will be maintained Outcome: Progressing   Problem: Coping: Goal: Level of anxiety will decrease Outcome: Progressing   Problem: Elimination: Goal: Will not experience complications related to bowel motility Outcome: Progressing Goal: Will not experience complications related to urinary retention Outcome: Progressing   Problem: Pain Managment: Goal: General experience of comfort will improve and/or be controlled Outcome: Progressing   Problem: Safety: Goal: Ability to remain free from injury will improve Outcome: Progressing   Problem: Skin Integrity: Goal: Risk for impaired skin integrity will decrease Outcome: Progressing   Problem: Nutritional: Goal: Maintenance of adequate nutrition will improve Outcome: Progressing

## 2023-12-31 NOTE — Progress Notes (Signed)
 PROGRESS NOTE    John [name]  FMW:[id number] DOB: 1945-10-09 DOA: 12/26/2023 PCP: [name], DO   Brief Narrative:  Chistian Kasler is a 78 y.o. male with medical history significant for hypertension, hyperlipidemia, type 2 diabetes mellitus, history of CVA, cognitive deficits, CAD, chronic HFrEF, provoked DVT no longer anticoagulated, BPH, urinary retention with Foley catheter, and recent admissions with UTI, bacteremia, and pneumonia who now returns with abdominal pain, nausea, and vomiting.   Patient returned home from the hospital on 12/22/2023, had been doing fairly well initially, but then developed recurrent abdominal pain with nausea and nonbloody vomiting last night.  Since then, he has continued to grimace while gesturing to his periumbilical abdomen.  He has also been vomiting repeatedly and unable to tolerate anything by mouth.  His wife does not believe that he has moved his bowels since he returned home.  She does not feel that she can adequately care for him and inquires about nursing home placement.  Nausea and Vomiting improved with the diet and been advanced and will continue to treat his constipation. PT/OT recommending SNF and TOC assisting w/ Disposition as SNF has been selected and they can accept the patient on Monday 01/02/24. He is medically stable for D/C at this time.   Assessment and Plan:  Nausea and Vomiting: Improved. Exam is benign and there No acute intra-abdominal findings or explanation for the patient's symptoms on CT Imaging.  CT imaging did show cholelithiasis without evidence of cholecystitis or biliary dilatation and there was mildly prominent stool throughout the colon suggesting constipation. IVF now stopped. Treating Constipation as below and will continue to Monitor Electrolytes. 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 Urinalysis was done and showed cloudy appearance with greater than 500 glucose, small hemoglobin, 5 ketones, large leukocytes, negative nitrites, rare bacteria and greater than 50 RBCs per high-power field and greater than 50 WBCs.  Blood cultures x 2 showed NGTD @ 3 Days; Urine culture showing 50,000 CFU of Yeast.  CT scan showed decompressed bladder with persistent diffuse wall thickening. Had Initiated IV Ceftriaxone  while awaiting Cx Results but suspect colonization and Funguria/ Asymptomatic Candiuria that is asymptomatic so will stop Abx. WBC went from 11.7 -> 9.3 -> 8.9 -> 7.3 on last check. Changed Foley Catheter this Admission.   T12 Compression Deformity: CT Scan done and showed new mild superior endplate compression deformity at T12 without osseous retropulsion or significant paraspinal soft tissue abnormality. Pain control and  Heating pad. Need PT/OT to Evaluate and Treat and they are recommending SNF. He is medically stable for D/C as he has no real complaints today   Hypokalemia: K+ is now 3.6 on last check. CTM and Replete as Necessary. Repeat CMP within 1 week  Hypomagnesemia: Mag Level is now 1.9 on last check. CTM and Replete as Necessary. Repeat Mag Level within 1 week   Chronic Systolic CHF: Appeared hypovolemic on admission and therapies have been limited by hypotension. Continue to hold Furosemide . Was cautiously hydrated w/ IVF but now stopped as he has improved. Strict I's and O's and Daily Weights.  Intake/Output Summary (Last 24 hours) at 12/30/2023 1345 Last data filed at 12/30/2023 0647 Gross per 24 hour  Intake 3 ml  Output 1650 ml  Net -1647 ml  -CTM for S/Sx of Volume Overload; appears euvolemic   Diabetes Mellitus  Type 2: A1c was 7.5% in April 2025; Changed Very Sensitive Novolog  SSI q4h to Sensitive Novolog  SSI AC/HS. CTM CBGs per protocol. CBG Trend:  Recent Labs  Lab 12/28/23 2209 12/29/23 0806 12/29/23 1241 12/29/23 1645 12/29/23 2048  12/30/23 0756 12/30/23 1257  GLUCAP 264* 198* 329* 280* 235* 211* 299*  -Diabetes Education Coordinator consulted and will start the patient on Semglee  10 units every day  CAD: Currently has No anginal symptoms; C/w Clopidogrel  75 mg po Dialy and Atorvastatin  80 mg po qHS  HLD: C/w Atorvastatin  80 m po qHS   Depression: C/w Esctialopram 10 mg po Daily   Hyponatremia: Mild and Improved. Na+ went from 134 -> 137 -> 135 -> 136 on last check. CTM and Trend and repeat CMP within 1 week   Breast Cancer: Follows with [facility] and is s/p mastectomy and chemotherapy; stopped Tamoxifen  early due to PE    Hx of VTE:  Was considered provoked and he completed treatment    Cognitive Impairment: C/w Delirium Precautions   Constipation: Changed Bowel Regimen to Senna-Docusate 1 tab po BID, Miralax  17 grams po BID, and add Bisacodyl  10 mg Rcprn. Checked KUB and showed Diminished colonic stool from recent CT. Small volume of formed stool persists. No small bowel distension or evidence of obstruction. Low pelvis including rectum not included in the field of view. Gallstones on CT are not well seen by radiograph. Multiple pelvic phleboliths.  Continue with bowel regimen at facility  Hyperbilirubinemia: Mild. T Bili is now 2.0 on last check. CTM and Trend and repeat CMP within 1 week  Normocytic Anemia: Hgb/Hct Trend Stable:  Recent Labs  Lab 12/22/23 0459 12/26/23 1040 12/26/23 1112 12/27/23 0458 12/28/23 0526 12/29/23 0535 12/30/23 0506  HGB 11.5* 12.6* 13.3 11.8* 11.8* 12.2* 12.9*  HCT 35.1* 39.1 39.0 36.5* 35.6* 37.9* 39.4  MCV 88.2 86.7  --  86.9 86.4 86.5 86.2  -Checked Anemia Panel showed an iron level 39, UIBC 2 1, TIBC 239, saturation ratio 60%, ferritin 157, folate level of 13.9. CTM for S/Sx of Bleeding; No overt bleeding noted. Repeat CBC within 1 week  Overweight: Complicates overall prognosis and care. Estimated body mass index is 23.44 kg/m as calculated from the following:    Height as of this encounter: 6' (1.829 m).   Weight as of this encounter: 78.4 kg. Weight Loss and Dietary Counseling given   DVT prophylaxis: Enoxaparin  40 mg sq q24h some how got discontinued so will resume    Code Status: Limited: Do not attempt resuscitation (DNR) -DNR-LIMITED -Do Not Intubate/DNI  Family Communication: No family present @ bedside   Disposition Plan:  Level of care: Telemetry Status is: Observation The patient remains OBS appropriate and will d/c before 2 midnights.   Consultants:  None  Procedures:  As delineated as above  Antimicrobials:  Anti-infectives (From admission, onward)    Start     Dose/Rate Route Frequency Ordered Stop   12/27/23 1430  cefTRIAXone  (ROCEPHIN ) 1 g in sodium chloride  0.9 % 100 mL IVPB  Status:  Discontinued        1 g 200 mL/hr over 30 Minutes Intravenous Daily 12/27/23 1424 12/28/23 1345   12/26/23 1300  fluconazole  (DIFLUCAN ) tablet 150 mg        150 mg Oral  Once 12/26/23 1255 12/26/23 1301       Subjective: Seen and examined at bedside and has no complaints and resting.  Nursing states that he continues to refuse some of his bowel  regiment and intermittently will agreeable today.  Nursing staff states had a bowel movement last few days.  No other concerns or complaints at this time.  Remains medically stable to go to SNF however cannot go until least Monday per TOC.  Objective: Vitals:   12/30/23 2030 12/31/23 0508 12/31/23 0717 12/31/23 1200  BP: (!) 172/78 (!) 163/89  (!) 159/93  Pulse: 86 82  88  Resp: 18 18  20   Temp: 98.9 F (37.2 C) 98.2 F (36.8 C)  98 F (36.7 C)  TempSrc: Oral Oral    SpO2: 96% 97%  97%  Weight:   80.7 kg   Height:        Intake/Output Summary (Last 24 hours) at 12/31/2023 1402 Last data filed at 12/31/2023 0500 Gross per 24 hour  Intake --  Output 900 ml  Net -900 ml   Filed Weights   12/29/23 0500 12/30/23 0500 12/31/23 0717  Weight: 78.4 kg 78.4 kg 80.7 kg    Examination: Physical Exam:  Constitutional: Elderly overweight Caucasian male in no acute distress Respiratory: Diminished to auscultation bilaterally, no wheezing, rales, rhonchi or crackles. Normal respiratory effort and patient is not tachypenic. No accessory muscle use.  Unlabored breathing Cardiovascular: RRR, no murmurs / rubs / gallops. S1 and S2 auscultated. No extremity edema.  Abdomen: Soft, non-tender, non-distended. Bowel sounds positive.  GU: Deferred. Musculoskeletal: No clubbing / cyanosis of digits/nails. No joint deformity upper and lower extremities.  Skin: No rashes, lesions, ulcers on limited skin evaluation. No induration; Warm and dry.  Neurologic: CN 2-12 grossly intact with no focal deficits. Romberg sign and cerebellar reflexes not assessed.  Psychiatric: Awake and alert  Data Reviewed: I have personally reviewed following labs and imaging studies  CBC: Recent Labs  Lab 12/26/23 1040 12/26/23 1112 12/27/23 0458 12/28/23 0526 12/29/23 0535 12/30/23 0506  WBC 11.7*  --  9.3 8.9 7.3 9.1  NEUTROABS 9.7*  --   --  6.0 4.5  --   HGB 12.6* 13.3 11.8* 11.8* 12.2* 12.9*  HCT 39.1 39.0 36.5* 35.6* 37.9* 39.4  MCV 86.7  --  86.9 86.4 86.5 86.2  PLT 226  --  240 230 273 287   Basic Metabolic Panel: Recent Labs  Lab 12/26/23 1040 12/26/23 1112 12/27/23 0458 12/28/23 0526 12/29/23 0535  NA 133* 134* 137 135 136  K 3.5 3.6 3.7 3.3* 3.6  CL 94* 95* 97* 96* 97*  CO2 25  --  26 25 26   GLUCOSE 291* 300* 210* 217* 205*  BUN 8 8 12 11 8   CREATININE 0.66 0.70 0.68 0.64 0.75  CALCIUM  9.8  --  9.7 9.3 9.8  MG  --   --  1.4* 1.8 1.9  PHOS  --   --  3.6 2.8 3.2   GFR: Estimated Creatinine Clearance: 84.9 mL/min (by C-G formula based on SCr of 0.75 mg/dL). Liver Function Tests: Recent Labs  Lab 12/26/23 1040 12/27/23 0458 12/28/23 0526 12/29/23 0535  AST 20 16 15 21   ALT 21 18 16 17   ALKPHOS 76 68 69 73  BILITOT 1.9* 2.0* 2.1* 2.0*  PROT 6.4* 5.7*  5.5* 5.9*  ALBUMIN 3.9 3.6 3.5 3.5   Recent Labs  Lab 12/26/23 1040  LIPASE 19   No results for input(s): AMMONIA in the last 168 hours. Coagulation Profile: No results for input(s): INR, PROTIME in the last 168 hours. Cardiac Enzymes: No results for input(s): CKTOTAL, CKMB, CKMBINDEX, TROPONINI in the last 168 hours. BNP (last  3 results) No results for input(s): PROBNP in the last 8760 hours. HbA1C: No results for input(s): HGBA1C in the last 72 hours. CBG: Recent Labs  Lab 12/30/23 0756 12/30/23 1257 12/30/23 1642 12/31/23 0747 12/31/23 1157  GLUCAP 211* 299* 226* 208* 207*   Lipid Profile: No results for input(s): CHOL, HDL, LDLCALC, TRIG, CHOLHDL, LDLDIRECT in the last 72 hours. Thyroid  Function Tests: No results for input(s): TSH, T4TOTAL, FREET4, T3FREE, THYROIDAB in the last 72 hours. Anemia Panel: Recent Labs    12/28/23 1440 12/29/23 0535  VITAMINB12 1,143*  --   FOLATE  --  13.9  FERRITIN  --  57  TIBC  --  239*  IRON  --  39*  RETICCTPCT  --  2.5   Sepsis Labs: Recent Labs  Lab 12/26/23 1112  LATICACIDVEN 2.1*   Recent Results (from the past 240 hours)  Blood culture (routine x 2)     Status: None   Collection Time: 12/26/23 10:44 AM   Specimen: BLOOD LEFT FOREARM  Result Value Ref Range Status   Specimen Description   Final    BLOOD LEFT FOREARM Performed at [facility], [address], [place], [place] [address]    Special Requests   Final    BOTTLES DRAWN AEROBIC AND ANAEROBIC Blood Culture results may not be optimal due to an inadequate volume of blood received in culture bottles Performed at [facility], [address] [place], [place] [address]    Culture   Final    NO GROWTH 5 DAYS Performed at [facility], [address], [place], [place] [address]    Report Status 12/31/2023 FINAL  Final  Blood culture (routine x 2)     Status: None   Collection Time:  12/26/23 10:49 AM   Specimen: BLOOD RIGHT FOREARM  Result Value Ref Range Status   Specimen Description   Final    BLOOD RIGHT FOREARM Performed at [facility], [address], [place], [place] [address]    Special Requests   Final    BOTTLES DRAWN AEROBIC AND ANAEROBIC Blood Culture results may not be optimal due to an inadequate volume of blood received in culture bottles Performed at [facility], [address] [place], [place] [address]    Culture   Final    NO GROWTH 5 DAYS Performed at [facility], [address], [place], [place] [address]    Report Status 12/31/2023 FINAL  Final  Urine Culture     Status: Abnormal   Collection Time: 12/26/23 10:53 AM   Specimen: Urine, Random  Result Value Ref Range Status   Specimen Description   Final    URINE, RANDOM Performed at [facility], [address], [place], [place] [address]    Special Requests   Final    NONE Reflexed from [id number] Performed at [facility], [address], [place], [place] [address]    Culture 50,000 COLONIES/mL YEAST (A)  Final   Report Status 12/27/2023 FINAL  Final    Radiology Studies: No results found.  Scheduled Meds:  atorvastatin   80 mg Oral QHS   bisacodyl   10 mg Rectal Once   Chlorhexidine  Gluconate Cloth  6 each Topical Daily   clopidogrel   75 mg Oral Q breakfast   escitalopram   10 mg Oral Daily   insulin  aspart  0-5 Units Subcutaneous QHS   insulin  aspart  0-9 Units Subcutaneous TID  WC   insulin  glargine  10 Units Subcutaneous Q24H   pantoprazole   40 mg Oral QHS   polyethylene glycol  17 g Oral BID   potassium chloride   40 mEq Oral Once   senna-docusate  1 tablet Oral BID   sodium chloride  flush  3 mL Intravenous Q12H   sodium phosphate   1 enema Rectal Once   Continuous Infusions:   LOS: 0 days   Alejandro Marker, DO [facility] Available via Epic secure chat 7am-7pm After these hours, please refer to  coverage provider listed on [email or web address] 12/31/2023, 2:02 PM

## 2024-01-01 DIAGNOSIS — R112 Nausea with vomiting, unspecified: Secondary | ICD-10-CM | POA: Diagnosis not present

## 2024-01-01 DIAGNOSIS — I2510 Atherosclerotic heart disease of native coronary artery without angina pectoris: Secondary | ICD-10-CM | POA: Diagnosis not present

## 2024-01-01 DIAGNOSIS — R339 Retention of urine, unspecified: Secondary | ICD-10-CM | POA: Diagnosis not present

## 2024-01-01 DIAGNOSIS — R4189 Other symptoms and signs involving cognitive functions and awareness: Secondary | ICD-10-CM | POA: Diagnosis not present

## 2024-01-01 LAB — COMPREHENSIVE METABOLIC PANEL WITH GFR
ALT: 21 U/L (ref 0–44)
AST: 23 U/L (ref 15–41)
Albumin: 3.4 g/dL — ABNORMAL LOW (ref 3.5–5.0)
Alkaline Phosphatase: 81 U/L (ref 38–126)
Anion gap: 14 (ref 5–15)
BUN: 21 mg/dL (ref 8–23)
CO2: 24 mmol/L (ref 22–32)
Calcium: 9.8 mg/dL (ref 8.9–10.3)
Chloride: 95 mmol/L — ABNORMAL LOW (ref 98–111)
Creatinine, Ser: 1.08 mg/dL (ref 0.61–1.24)
GFR, Estimated: >60 mL/min (ref >60)
Glucose, Bld: 240 mg/dL — ABNORMAL HIGH (ref 70–99)
Potassium: 3.8 mmol/L (ref 3.5–5.1)
Sodium: 133 mmol/L — ABNORMAL LOW (ref 135–145)
Total Bilirubin: 1.9 mg/dL — ABNORMAL HIGH (ref 0.0–1.2)
Total Protein: 5.5 g/dL — ABNORMAL LOW (ref 6.5–8.1)

## 2024-01-01 LAB — CBC WITH DIFFERENTIAL/PLATELET
Abs Immature Granulocytes: 0.05 K/uL (ref 0.00–0.07)
Basophils Absolute: 0.1 K/uL (ref 0.0–0.1)
Basophils Relative: 1 %
Eosinophils Absolute: 0.2 K/uL (ref 0.0–0.5)
Eosinophils Relative: 2 %
HCT: 40.3 % (ref 39.0–52.0)
Hemoglobin: 12.7 g/dL — ABNORMAL LOW (ref 13.0–17.0)
Immature Granulocytes: 1 %
Lymphocytes Relative: 26 %
Lymphs Abs: 2.5 K/uL (ref 0.7–4.0)
MCH: 28.8 pg (ref 26.0–34.0)
MCHC: 31.5 g/dL (ref 30.0–36.0)
MCV: 91.4 fL (ref 80.0–100.0)
Monocytes Absolute: 0.6 K/uL (ref 0.1–1.0)
Monocytes Relative: 7 %
Neutro Abs: 6.3 K/uL (ref 1.7–7.7)
Neutrophils Relative %: 63 %
Platelets: 319 K/uL (ref 150–400)
RBC: 4.41 MIL/uL (ref 4.22–5.81)
RDW: 18.8 % — ABNORMAL HIGH (ref 11.5–15.5)
WBC: 9.8 K/uL (ref 4.0–10.5)
nRBC: 0 % (ref 0.0–0.2)

## 2024-01-01 LAB — MAGNESIUM: Magnesium: 1.9 mg/dL (ref 1.7–2.4)

## 2024-01-01 LAB — GLUCOSE, CAPILLARY
Glucose-Capillary: 203 mg/dL — ABNORMAL HIGH (ref 70–99)
Glucose-Capillary: 260 mg/dL — ABNORMAL HIGH (ref 70–99)
Glucose-Capillary: 201 mg/dL — ABNORMAL HIGH (ref 70–99)
Glucose-Capillary: 180 mg/dL — ABNORMAL HIGH (ref 70–99)

## 2024-01-01 LAB — PHOSPHORUS: Phosphorus: 3.8 mg/dL (ref 2.5–4.6)

## 2024-01-01 MED ORDER — INSULIN GLARGINE 100 UNIT/ML ~~LOC~~ SOLN
15.0000 [IU] | SUBCUTANEOUS | Status: DC
  Administered 2024-01-01 – 2024-01-02 (×2): 15 [IU] via SUBCUTANEOUS
  Filled 2024-01-01 (×2): qty 0.15

## 2024-01-01 MED ORDER — INSULIN ASPART 100 UNIT/ML IJ SOLN
0.0000 [IU] | Freq: Three times a day (TID) | INTRAMUSCULAR | Status: DC
  Administered 2024-01-01: 5 [IU] via SUBCUTANEOUS
  Administered 2024-01-02 (×2): 3 [IU] via SUBCUTANEOUS

## 2024-01-01 MED ORDER — INSULIN ASPART 100 UNIT/ML IJ SOLN: 0.0000 [IU] | Freq: Every day | INTRAMUSCULAR | Status: DC

## 2024-01-01 NOTE — Plan of Care (Signed)

## 2024-01-01 NOTE — Progress Notes (Signed)
 PROGRESS NOTE    John [name]  FMW:[id number] DOB: 1946/02/04 DOA: 12/26/2023 PCP: [name], DO   Brief Narrative:  John [name] is a 78 y.o. male with medical history significant for hypertension, hyperlipidemia, type 2 diabetes mellitus, history of CVA, cognitive deficits, CAD, chronic HFrEF, provoked DVT no longer anticoagulated, BPH, urinary retention with Foley catheter, and recent admissions with UTI, bacteremia, and pneumonia who now returns with abdominal pain, nausea, and vomiting.   Patient returned home from the hospital on 12/22/2023, had been doing fairly well initially, but then developed recurrent abdominal pain with nausea and nonbloody vomiting last night.  Since then, he has continued to grimace while gesturing to his periumbilical abdomen.  He has also been vomiting repeatedly and unable to tolerate anything by mouth.  His wife does not believe that he has moved his bowels since he returned home.  She does not feel that she can adequately care for him and inquires about nursing home placement.  Nausea and Vomiting improved with the diet and been advanced and will continue to treat his constipation. PT/OT recommending SNF and TOC assisting w/ Disposition as SNF has been selected and they can accept the patient on Monday 01/02/24. He is medically stable for D/C at this time and awaiting transfer to SNF.   Assessment and Plan:  Nausea and Vomiting: Improved. Exam is benign and there No acute intra-abdominal findings or explanation for the patient's symptoms on CT Imaging.  CT imaging did show cholelithiasis without evidence of cholecystitis or biliary dilatation and there was mildly prominent stool throughout the colon suggesting constipation. IVF now stopped. Treating Constipation as below and will continue to Monitor Electrolytes. 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 Urinalysis was done and showed cloudy appearance with greater than 500 glucose, small hemoglobin, 5 ketones, large leukocytes, negative nitrites, rare bacteria and greater than 50 RBCs per high-power field and greater than 50 WBCs.  Blood cultures x 2 showed NGTD @ 3 Days; Urine culture showing 50,000 CFU of Yeast.  CT scan showed decompressed bladder with persistent diffuse wall thickening. Had Initiated IV Ceftriaxone  while awaiting Cx Results but suspect colonization and Funguria/ Asymptomatic Candiuria that is asymptomatic so will stop Abx. WBC went from 11.7 -> 9.3 -> 8.9 -> 7.3 on last check. Changed Foley Catheter this Admission.   T12 Compression Deformity: CT Scan done and showed new mild superior endplate compression deformity at T12 without osseous retropulsion or significant paraspinal soft tissue abnormality. Pain control and  Heating pad. Need PT/OT to Evaluate and Treat and they are recommending SNF. He is medically stable for D/C as he has no real complaints today of pain   Hypokalemia: K+ is now 3.6 on last check. CTM and Replete as Necessary. Repeat CMP within 1 week  Hypomagnesemia: Mag Level is now 1.9 on last check. CTM and Replete as Necessary. Repeat Mag Level within 1 week   Chronic Systolic CHF: Appeared hypovolemic on admission and therapies had been limited by hypotension. Continue to hold Furosemide . Was cautiously hydrated w/ IVF but now stopped as he has improved. Strict I's and O's and Daily Weights.  Intake/Output Summary (Last 24 hours) at 01/01/2024 1403 Last data filed at 01/01/2024 0947 Gross per 24 hour  Intake 477 ml  Output 350 ml  Net 127 ml  -CTM for S/Sx of Volume  Overload; appears euvolemic at this time and can resume diuretics at D/C   Diabetes Mellitus Type 2: A1c was 7.5% in April 2025; Changed Very Sensitive Novolog  SSI q4h to Sensitive Novolog  SSI AC/HS but now will go to Moderate Scale. CTM CBGs per protocol. CBG  Trend ranging from 194-285 on the last 7 checks. Diabetes Education Coordinator consulted and the patient was started on Semglee  10 units every day and will increase to 15 units daily   CAD: Currently has No anginal symptoms; C/w Clopidogrel  75 mg po Dialy and Atorvastatin  80 mg po qHS  HLD: C/w Atorvastatin  80 m po qHS   Depression: C/w Esctialopram 10 mg po Daily   Hyponatremia: Mild and Improved. Na+ went from 134 -> 137 -> 135 -> 136 on last check. CTM and Trend and repeat CMP within 1 week   Breast Cancer: Follows with [facility] and is s/p mastectomy and chemotherapy; stopped Tamoxifen  early due to PE    Hx of VTE:  Was considered provoked and he completed treatment    Cognitive Impairment: C/w Delirium Precautions   Constipation: Changed Bowel Regimen to Senna-Docusate 1 tab po BID, Miralax  17 grams po BID, and add Bisacodyl  10 mg Rcprn. Checked KUB on 9/18 and showed Diminished colonic stool from recent CT. Small volume of formed stool persists. No small bowel distension or evidence of obstruction. Low pelvis including rectum not included in the field of view. Gallstones on CT are not well seen by radiograph. Multiple pelvic phleboliths.  Continue with bowel regimen at facility; Received a Fleet Enema 9/20 and had a very large Bowel movement.   Hyperbilirubinemia: Mild. T Bili is now 2.0 on last check. CTM and Trend and repeat CMP within 1 week  Normocytic Anemia: Hgb/Hct Trend Stable:  Recent Labs  Lab 12/22/23 0459 12/26/23 1040 12/26/23 1112 12/27/23 0458 12/28/23 0526 12/29/23 0535 12/30/23 0506  HGB 11.5* 12.6* 13.3 11.8* 11.8* 12.2* 12.9*  HCT 35.1* 39.1 39.0 36.5* 35.6* 37.9* 39.4  MCV 88.2 86.7  --  86.9 86.4 86.5 86.2  -Checked Anemia Panel showed an iron level 39, UIBC 2 1, TIBC 239, saturation ratio 60%, ferritin 157, folate level of 13.9. CTM for S/Sx of Bleeding; No overt bleeding noted. Repeat CBC within 1 week  Overweight: Complicates overall prognosis and  care. Estimated body mass index is 24.49 kg/m as calculated from the following:   Height as of this encounter: 6' (1.829 m).   Weight as of this encounter: 81.9 kg. Weight Loss and Dietary Counseling given   DVT prophylaxis: enoxaparin  (LOVENOX ) injection 40 mg Start: 12/31/23 2200    Code Status: Limited: Do not attempt resuscitation (DNR) -DNR-LIMITED -Do Not Intubate/DNI  Family Communication: No family currently at bedside  Disposition Plan:  Level of care: Telemetry Status is: Observation The patient remains OBS appropriate and will d/c before 2 midnights.   Consultants:  None  Procedures:  As delineated as above   Antimicrobials:  Anti-infectives (From admission, onward)    Start     Dose/Rate Route Frequency Ordered Stop   12/27/23 1430  cefTRIAXone  (ROCEPHIN ) 1 g in sodium chloride  0.9 % 100 mL IVPB  Status:  Discontinued        1 g 200 mL/hr over 30 Minutes Intravenous Daily 12/27/23 1424 12/28/23 1345   12/26/23 1300  fluconazole  (DIFLUCAN ) tablet 150 mg        150 mg Oral  Once 12/26/23 1255 12/26/23 1301       Subjective: Seen and  examined at bedside and is resting and eating ice cream in his lungs.  Had a very large bowel movement after enema yesterday.  No nausea or vomiting and feels well.  Denies any other concerns or complaints at this time.  Objective: Vitals:   12/31/23 2117 01/01/24 0426 01/01/24 0615 01/01/24 1138  BP: 131/75 126/72  134/75  Pulse: 84 87  78  Resp: 18 18  17   Temp: 98.5 F (36.9 C) 98 F (36.7 C)  97.8 F (36.6 C)  TempSrc: Oral Oral  Oral  SpO2: 97% 97%  97%  Weight:   81.9 kg   Height:        Intake/Output Summary (Last 24 hours) at 01/01/2024 1404 Last data filed at 01/01/2024 0947 Gross per 24 hour  Intake 477 ml  Output 350 ml  Net 127 ml   Filed Weights   12/30/23 0500 12/31/23 0717 01/01/24 0615  Weight: 78.4 kg 80.7 kg 81.9 kg   Examination: Physical Exam:  Constitutional: Elderly overweight Caucasian male  no acute distress Respiratory: Diminished to auscultation bilaterally, no wheezing, rales, rhonchi or crackles. Normal respiratory effort and patient is not tachypenic. No accessory muscle use.  Unlabored breathing Cardiovascular: RRR, no murmurs / rubs / gallops. S1 and S2 auscultated. No extremity edema.  Abdomen: Soft, non-tender, non-distended. Bowel sounds positive.  GU: Deferred.  Catheter is in place Musculoskeletal: No clubbing / cyanosis of digits/nails. No joint deformity upper and lower extremities. Skin: No rashes, lesions, ulcers on limited skin evaluation. No induration; Warm and dry.  Neurologic: CN 2-12 grossly intact with no focal deficits. Romberg sign and cerebellar reflexes not assessed.  Psychiatric: Is awake and alert and appears calm  Data Reviewed: I have personally reviewed following labs and imaging studies  CBC: Recent Labs  Lab 12/26/23 1040 12/26/23 1112 12/27/23 0458 12/28/23 0526 12/29/23 0535 12/30/23 0506  WBC 11.7*  --  9.3 8.9 7.3 9.1  NEUTROABS 9.7*  --   --  6.0 4.5  --   HGB 12.6* 13.3 11.8* 11.8* 12.2* 12.9*  HCT 39.1 39.0 36.5* 35.6* 37.9* 39.4  MCV 86.7  --  86.9 86.4 86.5 86.2  PLT 226  --  240 230 273 287   Basic Metabolic Panel: Recent Labs  Lab 12/26/23 1040 12/26/23 1112 12/27/23 0458 12/28/23 0526 12/29/23 0535  NA 133* 134* 137 135 136  K 3.5 3.6 3.7 3.3* 3.6  CL 94* 95* 97* 96* 97*  CO2 25  --  26 25 26   GLUCOSE 291* 300* 210* 217* 205*  BUN 8 8 12 11 8   CREATININE 0.66 0.70 0.68 0.64 0.75  CALCIUM  9.8  --  9.7 9.3 9.8  MG  --   --  1.4* 1.8 1.9  PHOS  --   --  3.6 2.8 3.2   GFR: Estimated Creatinine Clearance: 84.9 mL/min (by C-G formula based on SCr of 0.75 mg/dL). Liver Function Tests: Recent Labs  Lab 12/26/23 1040 12/27/23 0458 12/28/23 0526 12/29/23 0535  AST 20 16 15 21   ALT 21 18 16 17   ALKPHOS 76 68 69 73  BILITOT 1.9* 2.0* 2.1* 2.0*  PROT 6.4* 5.7* 5.5* 5.9*  ALBUMIN 3.9 3.6 3.5 3.5   Recent Labs   Lab 12/26/23 1040  LIPASE 19   No results for input(s): AMMONIA in the last 168 hours. Coagulation Profile: No results for input(s): INR, PROTIME in the last 168 hours. Cardiac Enzymes: No results for input(s): CKTOTAL, CKMB, CKMBINDEX, TROPONINI in the last  168 hours. BNP (last 3 results) No results for input(s): PROBNP in the last 8760 hours. HbA1C: No results for input(s): HGBA1C in the last 72 hours. CBG: Recent Labs  Lab 12/31/23 1157 12/31/23 1651 12/31/23 2127 01/01/24 0716 01/01/24 1139  GLUCAP 207* 194* 285* 203* 260*   Lipid Profile: No results for input(s): CHOL, HDL, LDLCALC, TRIG, CHOLHDL, LDLDIRECT in the last 72 hours. Thyroid  Function Tests: No results for input(s): TSH, T4TOTAL, FREET4, T3FREE, THYROIDAB in the last 72 hours. Anemia Panel: No results for input(s): VITAMINB12, FOLATE, FERRITIN, TIBC, IRON, RETICCTPCT in the last 72 hours. Sepsis Labs: Recent Labs  Lab 12/26/23 1112  LATICACIDVEN 2.1*   Recent Results (from the past 240 hours)  Blood culture (routine x 2)     Status: None   Collection Time: 12/26/23 10:44 AM   Specimen: BLOOD LEFT FOREARM  Result Value Ref Range Status   Specimen Description   Final    BLOOD LEFT FOREARM Performed at [facility], [address], [place], [place] [address]    Special Requests   Final    BOTTLES DRAWN AEROBIC AND ANAEROBIC Blood Culture results may not be optimal due to an inadequate volume of blood received in culture bottles Performed at [facility], [address], [place], [place] [address]    Culture   Final    NO GROWTH 5 DAYS Performed at [facility], [address] [place], [place] [address]    Report Status 12/31/2023 FINAL  Final  Blood culture (routine x 2)     Status: None   Collection Time: 12/26/23 10:49 AM   Specimen: BLOOD RIGHT FOREARM  Result Value Ref Range Status   Specimen Description    Final    BLOOD RIGHT FOREARM Performed at [facility], [address] [place], [place] [address]    Special Requests   Final    BOTTLES DRAWN AEROBIC AND ANAEROBIC Blood Culture results may not be optimal due to an inadequate volume of blood received in culture bottles Performed at [facility], [address], [place], [place] [address]    Culture   Final    NO GROWTH 5 DAYS Performed at [facility], [address] [place], [place] [address]    Report Status 12/31/2023 FINAL  Final  Urine Culture     Status: Abnormal   Collection Time: 12/26/23 10:53 AM   Specimen: Urine, Random  Result Value Ref Range Status   Specimen Description   Final    URINE, RANDOM Performed at [facility], [address] [place], [place] [address]    Special Requests   Final    NONE Reflexed from [id number] Performed at [facility], [address], [place], [place] [address]    Culture 50,000 COLONIES/mL YEAST (A)  Final   Report Status 12/27/2023 FINAL  Final    Radiology Studies: No results found.  Scheduled Meds:  atorvastatin   80 mg Oral QHS   bisacodyl   10 mg Rectal Once   Chlorhexidine  Gluconate Cloth  6 each Topical Daily   clopidogrel   75 mg Oral Q breakfast   enoxaparin  (LOVENOX ) injection  40 mg Subcutaneous Q24H   escitalopram   10 mg Oral Daily   insulin  aspart  0-15 Units Subcutaneous TID WC   insulin  aspart  0-5 Units Subcutaneous QHS   insulin  glargine  15 Units Subcutaneous Q24H   pantoprazole   40 mg Oral QHS  polyethylene glycol  17 g Oral BID   potassium chloride   40 mEq Oral Once   senna-docusate  1 tablet Oral BID   sodium chloride  flush  3 mL Intravenous Q12H   Continuous Infusions:   LOS: 0 days   Alejandro Marker, DO [facility] Available via Epic secure chat 7am-7pm After these hours, please refer to coverage provider listed on [email or web address] 01/01/2024, 2:04 PM

## 2024-01-02 ENCOUNTER — Other Ambulatory Visit (HOSPITAL_BASED_OUTPATIENT_CLINIC_OR_DEPARTMENT_OTHER): Payer: Self-pay

## 2024-01-02 DIAGNOSIS — E876 Hypokalemia: Secondary | ICD-10-CM | POA: Diagnosis not present

## 2024-01-02 DIAGNOSIS — R29818 Other symptoms and signs involving the nervous system: Secondary | ICD-10-CM | POA: Diagnosis not present

## 2024-01-02 DIAGNOSIS — Z7901 Long term (current) use of anticoagulants: Secondary | ICD-10-CM | POA: Diagnosis not present

## 2024-01-02 DIAGNOSIS — G8194 Hemiplegia, unspecified affecting left nondominant side: Secondary | ICD-10-CM | POA: Diagnosis present

## 2024-01-02 DIAGNOSIS — R41841 Cognitive communication deficit: Secondary | ICD-10-CM

## 2024-01-02 DIAGNOSIS — Z8673 Personal history of transient ischemic attack (TIA), and cerebral infarction without residual deficits: Secondary | ICD-10-CM | POA: Diagnosis not present

## 2024-01-02 DIAGNOSIS — E46 Unspecified protein-calorie malnutrition: Secondary | ICD-10-CM | POA: Diagnosis not present

## 2024-01-02 DIAGNOSIS — I5022 Chronic systolic (congestive) heart failure: Secondary | ICD-10-CM | POA: Diagnosis not present

## 2024-01-02 DIAGNOSIS — I69328 Other speech and language deficits following cerebral infarction: Secondary | ICD-10-CM | POA: Diagnosis not present

## 2024-01-02 DIAGNOSIS — I5020 Unspecified systolic (congestive) heart failure: Secondary | ICD-10-CM | POA: Diagnosis not present

## 2024-01-02 DIAGNOSIS — C50921 Malignant neoplasm of unspecified site of right male breast: Secondary | ICD-10-CM | POA: Diagnosis not present

## 2024-01-02 DIAGNOSIS — I69398 Other sequelae of cerebral infarction: Secondary | ICD-10-CM | POA: Diagnosis not present

## 2024-01-02 DIAGNOSIS — I2510 Atherosclerotic heart disease of native coronary artery without angina pectoris: Secondary | ICD-10-CM | POA: Insufficient documentation

## 2024-01-02 DIAGNOSIS — R109 Unspecified abdominal pain: Secondary | ICD-10-CM | POA: Diagnosis not present

## 2024-01-02 DIAGNOSIS — K219 Gastro-esophageal reflux disease without esophagitis: Secondary | ICD-10-CM | POA: Diagnosis not present

## 2024-01-02 DIAGNOSIS — N401 Enlarged prostate with lower urinary tract symptoms: Secondary | ICD-10-CM | POA: Diagnosis not present

## 2024-01-02 DIAGNOSIS — Z6824 Body mass index (BMI) 24.0-24.9, adult: Secondary | ICD-10-CM | POA: Diagnosis not present

## 2024-01-02 DIAGNOSIS — I6990 Unspecified sequelae of unspecified cerebrovascular disease: Secondary | ICD-10-CM | POA: Insufficient documentation

## 2024-01-02 DIAGNOSIS — Z950 Presence of cardiac pacemaker: Secondary | ICD-10-CM | POA: Diagnosis not present

## 2024-01-02 DIAGNOSIS — M6281 Muscle weakness (generalized): Secondary | ICD-10-CM | POA: Diagnosis not present

## 2024-01-02 DIAGNOSIS — B3749 Other urogenital candidiasis: Secondary | ICD-10-CM | POA: Diagnosis not present

## 2024-01-02 DIAGNOSIS — Z9861 Coronary angioplasty status: Secondary | ICD-10-CM | POA: Insufficient documentation

## 2024-01-02 DIAGNOSIS — R5381 Other malaise: Secondary | ICD-10-CM | POA: Diagnosis not present

## 2024-01-02 DIAGNOSIS — N3000 Acute cystitis without hematuria: Secondary | ICD-10-CM | POA: Diagnosis not present

## 2024-01-02 DIAGNOSIS — Z86718 Personal history of other venous thrombosis and embolism: Secondary | ICD-10-CM | POA: Diagnosis not present

## 2024-01-02 DIAGNOSIS — R339 Retention of urine, unspecified: Secondary | ICD-10-CM | POA: Diagnosis not present

## 2024-01-02 DIAGNOSIS — Z7401 Bed confinement status: Secondary | ICD-10-CM | POA: Diagnosis not present

## 2024-01-02 DIAGNOSIS — I110 Hypertensive heart disease with heart failure: Secondary | ICD-10-CM | POA: Diagnosis not present

## 2024-01-02 DIAGNOSIS — G3184 Mild cognitive impairment, so stated: Secondary | ICD-10-CM | POA: Diagnosis not present

## 2024-01-02 DIAGNOSIS — R269 Unspecified abnormalities of gait and mobility: Secondary | ICD-10-CM | POA: Diagnosis not present

## 2024-01-02 DIAGNOSIS — E119 Type 2 diabetes mellitus without complications: Secondary | ICD-10-CM | POA: Diagnosis not present

## 2024-01-02 DIAGNOSIS — I672 Cerebral atherosclerosis: Secondary | ICD-10-CM | POA: Diagnosis not present

## 2024-01-02 DIAGNOSIS — I959 Hypotension, unspecified: Secondary | ICD-10-CM | POA: Diagnosis not present

## 2024-01-02 DIAGNOSIS — E785 Hyperlipidemia, unspecified: Secondary | ICD-10-CM | POA: Diagnosis not present

## 2024-01-02 DIAGNOSIS — Z7982 Long term (current) use of aspirin: Secondary | ICD-10-CM | POA: Diagnosis not present

## 2024-01-02 DIAGNOSIS — I639 Cerebral infarction, unspecified: Secondary | ICD-10-CM | POA: Diagnosis not present

## 2024-01-02 DIAGNOSIS — F32A Depression, unspecified: Secondary | ICD-10-CM | POA: Diagnosis not present

## 2024-01-02 DIAGNOSIS — I509 Heart failure, unspecified: Secondary | ICD-10-CM | POA: Diagnosis not present

## 2024-01-02 DIAGNOSIS — I6381 Other cerebral infarction due to occlusion or stenosis of small artery: Secondary | ICD-10-CM | POA: Diagnosis not present

## 2024-01-02 DIAGNOSIS — R112 Nausea with vomiting, unspecified: Secondary | ICD-10-CM | POA: Diagnosis not present

## 2024-01-02 DIAGNOSIS — Z794 Long term (current) use of insulin: Secondary | ICD-10-CM | POA: Diagnosis not present

## 2024-01-02 DIAGNOSIS — I700 Atherosclerosis of aorta: Secondary | ICD-10-CM | POA: Diagnosis not present

## 2024-01-02 DIAGNOSIS — R531 Weakness: Secondary | ICD-10-CM | POA: Diagnosis not present

## 2024-01-02 DIAGNOSIS — E663 Overweight: Secondary | ICD-10-CM | POA: Diagnosis not present

## 2024-01-02 DIAGNOSIS — R338 Other retention of urine: Secondary | ICD-10-CM | POA: Diagnosis not present

## 2024-01-02 DIAGNOSIS — N139 Obstructive and reflux uropathy, unspecified: Secondary | ICD-10-CM | POA: Insufficient documentation

## 2024-01-02 DIAGNOSIS — S22080D Wedge compression fracture of T11-T12 vertebra, subsequent encounter for fracture with routine healing: Secondary | ICD-10-CM | POA: Diagnosis not present

## 2024-01-02 DIAGNOSIS — E1165 Type 2 diabetes mellitus with hyperglycemia: Secondary | ICD-10-CM | POA: Diagnosis not present

## 2024-01-02 DIAGNOSIS — R262 Difficulty in walking, not elsewhere classified: Secondary | ICD-10-CM | POA: Diagnosis not present

## 2024-01-02 DIAGNOSIS — F325 Major depressive disorder, single episode, in full remission: Secondary | ICD-10-CM | POA: Diagnosis not present

## 2024-01-02 DIAGNOSIS — I10 Essential (primary) hypertension: Secondary | ICD-10-CM | POA: Diagnosis not present

## 2024-01-02 DIAGNOSIS — E871 Hypo-osmolality and hyponatremia: Secondary | ICD-10-CM | POA: Diagnosis not present

## 2024-01-02 DIAGNOSIS — C50911 Malignant neoplasm of unspecified site of right female breast: Secondary | ICD-10-CM | POA: Diagnosis not present

## 2024-01-02 DIAGNOSIS — R1084 Generalized abdominal pain: Secondary | ICD-10-CM | POA: Diagnosis not present

## 2024-01-02 DIAGNOSIS — R0989 Other specified symptoms and signs involving the circulatory and respiratory systems: Secondary | ICD-10-CM | POA: Diagnosis not present

## 2024-01-02 DIAGNOSIS — Z79899 Other long term (current) drug therapy: Secondary | ICD-10-CM | POA: Diagnosis not present

## 2024-01-02 LAB — GLUCOSE, CAPILLARY
Glucose-Capillary: 160 mg/dL — ABNORMAL HIGH (ref 70–99)
Glucose-Capillary: 183 mg/dL — ABNORMAL HIGH (ref 70–99)

## 2024-01-02 MED ORDER — INSULIN GLARGINE 100 UNIT/ML ~~LOC~~ SOLN
15.0000 [IU] | SUBCUTANEOUS | 11 refills | Status: DC
  Filled 2024-01-02: qty 10, 66d supply, fill #0

## 2024-01-02 NOTE — Progress Notes (Signed)
 RN called [employer], report given to Jamie an LPN.

## 2024-01-02 NOTE — Progress Notes (Signed)
 PROGRESS NOTE    John [name]  FMW:[id number] DOB: 1946-02-16 DOA: 12/26/2023 PCP: [name], DO   Brief Narrative:  This 78 y.o. male with medical history significant for hypertension, hyperlipidemia, type 2 diabetes mellitus, history of CVA, cognitive deficits, CAD, chronic HFrEF, provoked DVT no longer on anticoagulation, BPH, urinary retention with Foley catheter, and recent admissions with UTI, bacteremia, and pneumonia who now returns with abdominal pain, nausea, and vomiting.   Patient returned home from the hospital on 12/22/2023, had been doing fairly well initially, but then developed recurrent abdominal pain with nausea and nonbloody vomiting last night.  Since then, he has continued to grimace while gesturing to his periumbilical abdomen.  He has also been vomiting repeatedly and unable to tolerate anything by mouth.  His wife does not believe that he has moved his bowels since he returned home.  She does not feel that she can adequately care for him and inquires about nursing home placement.   Nausea and Vomiting improved with the diet and been advanced and will continue to treat his constipation. PT/OT recommending SNF and TOC assisting w/ Disposition as SNF has been selected and they can accept the patient on Monday 01/02/24. He is medically stable for D/C at this time , being discharged to SNF today.   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  Exam is benign and there No acute  intra-abdominal findings or explanation for the patient's symptoms on CT Imaging.  CT imaging did show cholelithiasis without evidence of cholecystitis or biliary dilatation and there was mildly prominent stool throughout the colon suggesting constipation. IVF now stopped. Treating Constipation as below and will continue to Monitor Electrolytes. Was on CLD and now on SOFT and tolerating without issues    Asympotmatic Funguria:  Has a Hx of Urinary Retention w/ Foley Cathter and recent admits w/ UTI and Bacteremia. Urinalysis was done and showed cloudy appearance with greater than 500 glucose, small hemoglobin, 5 ketones, large leukocytes, negative nitrites, rare bacteria and greater than 50 RBCs per high-power field and greater than 50 WBCs.  Blood cultures x 2 showed NGTD @ 3 Days; Urine culture showing 50,000 CFU of Yeast.  CT scan showed decompressed bladder with persistent diffuse wall thickening. Had Initiated IV Ceftriaxone  while awaiting Cx Results but suspect colonization and Funguria/ Asymptomatic Candiuria that is asymptomatic so will stop Abx. WBC went from 11.7 -> 9.3 -> 8.9 -> 7.3 on last check. Changed Foley Catheter this Admission.    T12 Compression Deformity:  CT Scan done and showed new mild superior endplate compression deformity at T12 without osseous retropulsion or significant paraspinal soft tissue abnormality. Pain control and  Heating pad. Need PT/OT to Evaluate and Treat and they are recommending SNF. He is medically stable for D/C as he has no real complaints today of pain .   Hypokalemia: Resolved.   Hypomagnesemia: Resolved.   Chronic Systolic CHF:  Appears euvolemic at this time and can resume diuretics at D/C.   Diabetes Mellitus Type 2:  HbA1c was  7.5% in April 2025; Continue Semglee  15 units daily.   CAD: No anginal symptoms; C/w Clopidogrel  75 mg po Dialy and Atorvastatin  80 mg po qHS   HLD: C/w Atorvastatin  80 m po qHS   Depression: C/w Esctialopram 10 mg po  Daily    Hyponatremia: Resolved.   Breast Cancer: Follows with [facility] and is s/p mastectomy and chemotherapy;  stopped Tamoxifen  early due to PE    Hx of VTE:  Was considered provoked and he completed treatment    Cognitive Impairment: C/w Delirium Precautions    Constipation: Changed Bowel Regimen to Senna-Docusate 1 tab po BID, Miralax  17 grams po BID, and add Bisacodyl  10 mg Rcprn. Checked KUB on 9/18 and showed Diminished colonic stool from recent CT. Small volume of formed stool persists. No small bowel distension or evidence of obstruction. Low pelvis including rectum not included in the field of view. Gallstones on CT are not well seen by radiograph. Multiple pelvic phleboliths.  Continue with bowel regimen at facility; Received a Fleet Enema 9/20 and had a very large Bowel movement.   Hyperbilirubinemia: Mild.  Follow-up outpatient.   Normocytic Anemia: H&H is stable.  No obvious visible bleeding.  Overweight: Complicates overall prognosis and care. Estimated body mass index is 24.49 kg/m as calculated from the following:   Height as of this encounter: 6' (1.829 m).   Weight as of this encounter: 81.9 kg. Weight Loss and Dietary Counseling given    DVT prophylaxis: Lovenox  Code Status: Full code Family Communication: No family at bed side Disposition Plan:    Status is: Observation The patient remains OBS appropriate and will d/c before 2 midnights.   Patient being discharged to SNF today.  Consultants:  None  Procedures:  Antimicrobials:   Subjective: Patient was seen and examined at bedside.  Overnight events noted. Patient reports feeling much improved,  states he is ready to be discharged to SNF. Still reports having constipation but improving.  Objective: Vitals:   01/01/24 0615 01/01/24 1138 01/01/24 2009 01/02/24 0456  BP:  134/75 132/71 120/82  Pulse:  78 88 88  Resp:  17 18 18   Temp:  97.8 F (36.6 C) 99.1 F (37.3 C) 98.2 F (36.8 C)   TempSrc:  Oral Oral Oral  SpO2:  97% 96% 96%  Weight: 81.9 kg     Height:       No intake or output data in the 24 hours ending 01/02/24 1444 Filed Weights   12/30/23 0500 12/31/23 0717 01/01/24 0615  Weight: 78.4 kg 80.7 kg 81.9 kg    Examination:  General exam: Appears calm and comfortable , deconditioned, not in any acute distress. Respiratory system: Clear to auscultation. Respiratory effort normal.  RR 14 Cardiovascular system: S1 & S2 heard, RRR. No JVD, murmurs, rubs, gallops or clicks. Gastrointestinal system: Abdomen is non distended, soft and nontender. . Normal bowel sounds heard. Central nervous system: Alert and oriented x 3. No focal neurological deficits. Extremities: No edema, no cyanosis, no clubbing. Skin: No rashes, lesions or ulcers Psychiatry: Judgement and insight appear normal. Mood & affect appropriate.   Data Reviewed: I have personally reviewed following labs and imaging studies  CBC: Recent Labs  Lab 12/27/23 0458 12/28/23 0526 12/29/23 0535 12/30/23 0506 01/01/24 1913  WBC 9.3 8.9 7.3 9.1 9.8  NEUTROABS  --  6.0 4.5  --  6.3  HGB 11.8* 11.8* 12.2* 12.9* 12.7*  HCT 36.5* 35.6* 37.9* 39.4 40.3  MCV 86.9 86.4 86.5 86.2 91.4  PLT 240 230 273 287 319   Basic Metabolic Panel: Recent Labs  Lab 12/27/23 0458 12/28/23 0526 12/29/23 0535 01/01/24 1913  NA 137 135 136 133*  K 3.7 3.3* 3.6 3.8  CL 97* 96* 97* 95*  CO2 26 25 26 24   GLUCOSE 210* 217* 205* 240*  BUN 12 11 8 21   CREATININE 0.68 0.64 0.75 1.08  CALCIUM  9.7 9.3 9.8 9.8  MG 1.4* 1.8 1.9 1.9  PHOS 3.6 2.8 3.2 3.8   GFR: Estimated Creatinine Clearance: 62.9 mL/min (by C-G formula based on SCr of 1.08 mg/dL). Liver Function Tests: Recent Labs  Lab 12/27/23 0458 12/28/23 0526 12/29/23 0535 01/01/24 1913  AST 16 15 21 23   ALT 18 16 17 21   ALKPHOS 68 69 73 81  BILITOT 2.0* 2.1* 2.0* 1.9*  PROT 5.7* 5.5* 5.9* 5.5*  ALBUMIN 3.6 3.5 3.5 3.4*   No results for input(s):  LIPASE, AMYLASE in the last 168 hours. No results for input(s): AMMONIA in the last 168 hours. Coagulation Profile: No results for input(s): INR, PROTIME in the last 168 hours. Cardiac Enzymes: No results for input(s): CKTOTAL, CKMB, CKMBINDEX, TROPONINI in the last 168 hours. BNP (last 3 results) No results for input(s): PROBNP in the last 8760 hours. HbA1C: No results for input(s): HGBA1C in the last 72 hours. CBG: Recent Labs  Lab 01/01/24 1139 01/01/24 1823 01/01/24 2100 01/02/24 0800 01/02/24 1308  GLUCAP 260* 201* 180* 160* 183*   Lipid Profile: No results for input(s): CHOL, HDL, LDLCALC, TRIG, CHOLHDL, LDLDIRECT in the last 72 hours. Thyroid  Function Tests: No results for input(s): TSH, T4TOTAL, FREET4, T3FREE, THYROIDAB in the last 72 hours. Anemia Panel: No results for input(s): VITAMINB12, FOLATE, FERRITIN, TIBC, IRON, RETICCTPCT in the last 72 hours. Sepsis Labs: No results for input(s): PROCALCITON, LATICACIDVEN in the last 168 hours.  Recent Results (from the past 240 hours)  Blood culture (routine x 2)     Status: None   Collection Time: 12/26/23 10:44 AM   Specimen: BLOOD LEFT FOREARM  Result Value Ref Range Status   Specimen Description   Final    BLOOD LEFT FOREARM Performed at [facility], [address], [place], [place] [address]    Special Requests   Final    BOTTLES DRAWN AEROBIC AND ANAEROBIC Blood Culture results may not be optimal due to an inadequate volume of blood received in culture bottles Performed at [facility], [address] [place], [place] [address]    Culture   Final    NO GROWTH 5 DAYS Performed at [facility], [address] [place], [place] [address]    Report Status 12/31/2023 FINAL  Final  Blood culture (routine x 2)     Status: None   Collection Time: 12/26/23 10:49 AM   Specimen: BLOOD RIGHT FOREARM  Result Value Ref Range  Status   Specimen Description   Final    BLOOD RIGHT FOREARM Performed at [facility], [address] [place], [place] [address]    Special Requests   Final    BOTTLES DRAWN AEROBIC AND ANAEROBIC Blood Culture results may not be optimal due to an inadequate volume of blood received in culture bottles Performed at [facility], [address], [place], [place] [address]    Culture   Final    NO GROWTH 5 DAYS Performed at [facility], [address], [place], [place] [address]    Report  Status 12/31/2023 FINAL  Final  Urine Culture     Status: Abnormal   Collection Time: 12/26/23 10:53 AM   Specimen: Urine, Random  Result Value Ref Range Status   Specimen Description   Final    URINE, RANDOM Performed at [facility], [address] [place], [place] [address]    Special Requests   Final    NONE Reflexed from [id number] Performed at [facility], [address] [place], [place] [address]    Culture 50,000 COLONIES/mL YEAST (A)  Final   Report Status 12/27/2023 FINAL  Final   Radiology Studies: No results found.  Scheduled Meds:  atorvastatin   80 mg Oral QHS   bisacodyl   10 mg Rectal Once   Chlorhexidine  Gluconate Cloth  6 each Topical Daily   clopidogrel   75 mg Oral Q breakfast   enoxaparin  (LOVENOX ) injection  40 mg Subcutaneous Q24H   escitalopram   10 mg Oral Daily   insulin  aspart  0-15 Units Subcutaneous TID WC   insulin  aspart  0-5 Units Subcutaneous QHS   insulin  glargine  15 Units Subcutaneous Q24H   pantoprazole   40 mg Oral QHS   polyethylene glycol  17 g Oral BID   potassium chloride   40 mEq Oral Once   senna-docusate  1 tablet Oral BID   sodium chloride  flush  3 mL Intravenous Q12H   Continuous Infusions:   LOS: 0 days    Time spent: 50 Mins    Darcel Dawley, MD [facility]   If 7PM-7AM, please contact night-coverage

## 2024-01-02 NOTE — TOC Transition Note (Signed)
 Transition of Care [facility]) - Discharge Note   Patient Details  Name: John [name] MRN: [id number] Date of Birth: 1945/08/28  [facility]) CM/SW Contact:  Sheri ONEIDA Sharps, LCSW Phone Number: 01/02/2024, 12:36 PM   Clinical Narrative:    Pt medically ready to dc to [facility]. Call report [phone] room 106 given to nurse. Ptar called at 11:50am. DC packet left at nurses station. Pt needs signed DNR, MD and nurse notified. No further TOC needs.   Final next level of care: Skilled Nursing Facility Barriers to Discharge: Barriers Resolved   Patient Goals and CMS Choice Patient states their goals for this hospitalization and ongoing recovery are:: return home CMS Medicare.gov Compare Post Acute Care list provided to:: Other (Comment Required) (spouse) Choice offered to / list presented to : NA [place] ownership interest in [facility].provided to:: Spouse    Discharge Placement   Existing PASRR number confirmed : 12/27/23          Patient chooses bed at: [facility] Patient to be transferred to facility by: [facility] Name of family member notified: Dedrick, Heffner (Spouse)  [phone] Patient and family notified of of transfer: 01/02/24  Discharge Plan and Services Additional resources added to the After Visit Summary for   In-house Referral: Clinical Social Work   Post Acute Care Choice: Skilled Nursing Facility          DME Arranged: N/A DME Agency: NA       HH Arranged: NA HH Agency: NA        Social Drivers of Health (SDOH) Interventions SDOH Screenings   Food Insecurity: No Food Insecurity (12/27/2023)  Housing: Low Risk  (12/27/2023)  Transportation Needs: No Transportation Needs (12/27/2023)  Utilities: Not At Risk (12/27/2023)  Alcohol Screen: Low Risk  (01/27/2023)  Depression (PHQ2-9): Low Risk  (01/28/2023)  Financial Resource Strain: Low Risk  (10/03/2023)  Physical Activity: Inactive (10/03/2023)  Social Connections:  Unknown (12/27/2023)  Recent Concern: Social Connections - Socially Isolated (12/05/2023)  Stress: No Stress Concern Present (10/03/2023)  Tobacco Use: Low Risk  (12/26/2023)     Readmission Risk Interventions    12/06/2023    4:03 PM 05/11/2023   12:52 PM 04/22/2023   12:12 PM  Readmission Risk Prevention Plan  Medication Screening   Complete  Transportation Screening Complete Complete Complete  PCP or Specialist Appt within 3-5 Days  Complete   HRI or Home Care Consult  Complete   Social Work Consult for Recovery Care Planning/Counseling  Complete   Palliative Care Screening  Not Applicable   Medication Review Oceanographer) Complete Complete   HRI or Home Care Consult Complete    SW Recovery Care/Counseling Consult Complete    Palliative Care Screening Not Applicable    Skilled Nursing Facility Not Applicable

## 2024-01-03 DIAGNOSIS — R5381 Other malaise: Secondary | ICD-10-CM | POA: Diagnosis not present

## 2024-01-03 DIAGNOSIS — I509 Heart failure, unspecified: Secondary | ICD-10-CM | POA: Diagnosis not present

## 2024-01-03 DIAGNOSIS — I69328 Other speech and language deficits following cerebral infarction: Secondary | ICD-10-CM | POA: Insufficient documentation

## 2024-01-03 DIAGNOSIS — I2510 Atherosclerotic heart disease of native coronary artery without angina pectoris: Secondary | ICD-10-CM | POA: Diagnosis not present

## 2024-01-03 DIAGNOSIS — N139 Obstructive and reflux uropathy, unspecified: Secondary | ICD-10-CM | POA: Diagnosis not present

## 2024-01-04 DIAGNOSIS — I509 Heart failure, unspecified: Secondary | ICD-10-CM | POA: Diagnosis not present

## 2024-01-04 DIAGNOSIS — R5381 Other malaise: Secondary | ICD-10-CM | POA: Diagnosis not present

## 2024-01-04 DIAGNOSIS — N139 Obstructive and reflux uropathy, unspecified: Secondary | ICD-10-CM | POA: Diagnosis not present

## 2024-01-04 DIAGNOSIS — I2510 Atherosclerotic heart disease of native coronary artery without angina pectoris: Secondary | ICD-10-CM | POA: Diagnosis not present

## 2024-01-04 LAB — CUP PACEART REMOTE DEVICE CHECK
Battery Remaining Longevity: 122 mo
Battery Remaining Percentage: 95.5 %
Battery Voltage: 3.01 V
Brady Statistic AP VP Percent: 1 %
Brady Statistic AP VS Percent: 14 %
Brady Statistic AS VP Percent: 1 %
Brady Statistic AS VS Percent: 85 %
Brady Statistic RA Percent Paced: 14 %
Brady Statistic RV Percent Paced: 1 %
Date Time Interrogation Session: 20250923144647
Implantable Lead Connection Status: 753985
Implantable Lead Connection Status: 753985
Implantable Lead Implant Date: 20250131
Implantable Lead Implant Date: 20250131
Implantable Lead Location: 753859
Implantable Lead Location: 753860
Implantable Pulse Generator Implant Date: 20250131
Lead Channel Impedance Value: 450 Ohm
Lead Channel Impedance Value: 450 Ohm
Lead Channel Pacing Threshold Amplitude: 0.75 V
Lead Channel Pacing Threshold Amplitude: 0.75 V
Lead Channel Pacing Threshold Pulse Width: 0.5 ms
Lead Channel Pacing Threshold Pulse Width: 0.5 ms
Lead Channel Sensing Intrinsic Amplitude: 4.1 mV
Lead Channel Sensing Intrinsic Amplitude: 4.9 mV
Lead Channel Setting Pacing Amplitude: 1 V
Lead Channel Setting Pacing Amplitude: 2 V
Lead Channel Setting Pacing Pulse Width: 0.5 ms
Lead Channel Setting Sensing Sensitivity: 0.5 mV
Pulse Gen Model: 2272
Pulse Gen Serial Number: 8241762

## 2024-01-04 NOTE — Progress Notes (Signed)
 Remote PPM Transmission

## 2024-01-05 ENCOUNTER — Ambulatory Visit: Admitting: Cardiology

## 2024-01-05 DIAGNOSIS — I5022 Chronic systolic (congestive) heart failure: Secondary | ICD-10-CM | POA: Diagnosis not present

## 2024-01-05 DIAGNOSIS — R339 Retention of urine, unspecified: Secondary | ICD-10-CM | POA: Diagnosis not present

## 2024-01-05 DIAGNOSIS — N401 Enlarged prostate with lower urinary tract symptoms: Secondary | ICD-10-CM | POA: Diagnosis not present

## 2024-01-05 DIAGNOSIS — F325 Major depressive disorder, single episode, in full remission: Secondary | ICD-10-CM | POA: Diagnosis not present

## 2024-01-06 DIAGNOSIS — I2510 Atherosclerotic heart disease of native coronary artery without angina pectoris: Secondary | ICD-10-CM | POA: Diagnosis not present

## 2024-01-06 DIAGNOSIS — I509 Heart failure, unspecified: Secondary | ICD-10-CM | POA: Diagnosis not present

## 2024-01-06 DIAGNOSIS — N139 Obstructive and reflux uropathy, unspecified: Secondary | ICD-10-CM | POA: Diagnosis not present

## 2024-01-06 DIAGNOSIS — R5381 Other malaise: Secondary | ICD-10-CM | POA: Diagnosis not present

## 2024-01-07 ENCOUNTER — Ambulatory Visit: Payer: Self-pay | Admitting: Cardiology

## 2024-01-09 ENCOUNTER — Ambulatory Visit: Payer: Self-pay | Admitting: Infectious Disease

## 2024-01-09 DIAGNOSIS — R5381 Other malaise: Secondary | ICD-10-CM | POA: Diagnosis not present

## 2024-01-09 DIAGNOSIS — I509 Heart failure, unspecified: Secondary | ICD-10-CM | POA: Diagnosis not present

## 2024-01-09 DIAGNOSIS — F325 Major depressive disorder, single episode, in full remission: Secondary | ICD-10-CM | POA: Diagnosis not present

## 2024-01-09 DIAGNOSIS — I5022 Chronic systolic (congestive) heart failure: Secondary | ICD-10-CM | POA: Diagnosis not present

## 2024-01-09 DIAGNOSIS — N139 Obstructive and reflux uropathy, unspecified: Secondary | ICD-10-CM | POA: Diagnosis not present

## 2024-01-09 DIAGNOSIS — N401 Enlarged prostate with lower urinary tract symptoms: Secondary | ICD-10-CM | POA: Diagnosis not present

## 2024-01-09 DIAGNOSIS — R339 Retention of urine, unspecified: Secondary | ICD-10-CM | POA: Diagnosis not present

## 2024-01-09 DIAGNOSIS — I2510 Atherosclerotic heart disease of native coronary artery without angina pectoris: Secondary | ICD-10-CM | POA: Diagnosis not present

## 2024-01-11 DIAGNOSIS — R5381 Other malaise: Secondary | ICD-10-CM | POA: Diagnosis not present

## 2024-01-11 DIAGNOSIS — I509 Heart failure, unspecified: Secondary | ICD-10-CM | POA: Diagnosis not present

## 2024-01-11 DIAGNOSIS — N139 Obstructive and reflux uropathy, unspecified: Secondary | ICD-10-CM | POA: Diagnosis not present

## 2024-01-11 DIAGNOSIS — S22080D Wedge compression fracture of T11-T12 vertebra, subsequent encounter for fracture with routine healing: Secondary | ICD-10-CM | POA: Diagnosis not present

## 2024-01-12 DIAGNOSIS — R339 Retention of urine, unspecified: Secondary | ICD-10-CM | POA: Diagnosis not present

## 2024-01-12 DIAGNOSIS — F325 Major depressive disorder, single episode, in full remission: Secondary | ICD-10-CM | POA: Diagnosis not present

## 2024-01-12 DIAGNOSIS — N401 Enlarged prostate with lower urinary tract symptoms: Secondary | ICD-10-CM | POA: Diagnosis not present

## 2024-01-12 DIAGNOSIS — N139 Obstructive and reflux uropathy, unspecified: Secondary | ICD-10-CM | POA: Diagnosis not present

## 2024-01-12 DIAGNOSIS — I5022 Chronic systolic (congestive) heart failure: Secondary | ICD-10-CM | POA: Diagnosis not present

## 2024-01-12 DIAGNOSIS — S22080D Wedge compression fracture of T11-T12 vertebra, subsequent encounter for fracture with routine healing: Secondary | ICD-10-CM | POA: Diagnosis not present

## 2024-01-12 DIAGNOSIS — R5381 Other malaise: Secondary | ICD-10-CM | POA: Diagnosis not present

## 2024-01-12 DIAGNOSIS — I509 Heart failure, unspecified: Secondary | ICD-10-CM | POA: Diagnosis not present

## 2024-01-13 ENCOUNTER — Other Ambulatory Visit (HOSPITAL_BASED_OUTPATIENT_CLINIC_OR_DEPARTMENT_OTHER): Payer: Self-pay

## 2024-01-16 ENCOUNTER — Encounter (HOSPITAL_COMMUNITY): Payer: Self-pay | Admitting: Emergency Medicine

## 2024-01-16 ENCOUNTER — Emergency Department (HOSPITAL_COMMUNITY)

## 2024-01-16 ENCOUNTER — Other Ambulatory Visit: Payer: Self-pay

## 2024-01-16 ENCOUNTER — Observation Stay (HOSPITAL_COMMUNITY)
Admission: EM | Admit: 2024-01-16 | Discharge: 2024-01-24 | Disposition: A | Discharge status: Home / Self Care | Attending: Family Medicine | Admitting: Family Medicine

## 2024-01-16 DIAGNOSIS — I672 Cerebral atherosclerosis: Secondary | ICD-10-CM | POA: Diagnosis not present

## 2024-01-16 DIAGNOSIS — E785 Hyperlipidemia, unspecified: Secondary | ICD-10-CM | POA: Diagnosis not present

## 2024-01-16 DIAGNOSIS — Z794 Long term (current) use of insulin: Secondary | ICD-10-CM | POA: Insufficient documentation

## 2024-01-16 DIAGNOSIS — Z950 Presence of cardiac pacemaker: Secondary | ICD-10-CM | POA: Diagnosis not present

## 2024-01-16 DIAGNOSIS — I110 Hypertensive heart disease with heart failure: Secondary | ICD-10-CM | POA: Diagnosis not present

## 2024-01-16 DIAGNOSIS — I10 Essential (primary) hypertension: Secondary | ICD-10-CM

## 2024-01-16 DIAGNOSIS — E119 Type 2 diabetes mellitus without complications: Secondary | ICD-10-CM | POA: Insufficient documentation

## 2024-01-16 DIAGNOSIS — I509 Heart failure, unspecified: Secondary | ICD-10-CM | POA: Diagnosis not present

## 2024-01-16 DIAGNOSIS — I5020 Unspecified systolic (congestive) heart failure: Secondary | ICD-10-CM | POA: Insufficient documentation

## 2024-01-16 DIAGNOSIS — Z7982 Long term (current) use of aspirin: Secondary | ICD-10-CM | POA: Diagnosis not present

## 2024-01-16 DIAGNOSIS — R338 Other retention of urine: Secondary | ICD-10-CM | POA: Diagnosis not present

## 2024-01-16 DIAGNOSIS — G8194 Hemiplegia, unspecified affecting left nondominant side: Secondary | ICD-10-CM | POA: Diagnosis not present

## 2024-01-16 DIAGNOSIS — N3000 Acute cystitis without hematuria: Secondary | ICD-10-CM | POA: Diagnosis not present

## 2024-01-16 DIAGNOSIS — R5381 Other malaise: Secondary | ICD-10-CM | POA: Diagnosis not present

## 2024-01-16 DIAGNOSIS — Z79899 Other long term (current) drug therapy: Secondary | ICD-10-CM | POA: Insufficient documentation

## 2024-01-16 DIAGNOSIS — R531 Weakness: Principal | ICD-10-CM

## 2024-01-16 DIAGNOSIS — S22080D Wedge compression fracture of T11-T12 vertebra, subsequent encounter for fracture with routine healing: Secondary | ICD-10-CM | POA: Diagnosis not present

## 2024-01-16 DIAGNOSIS — I2510 Atherosclerotic heart disease of native coronary artery without angina pectoris: Secondary | ICD-10-CM | POA: Diagnosis not present

## 2024-01-16 DIAGNOSIS — N401 Enlarged prostate with lower urinary tract symptoms: Secondary | ICD-10-CM | POA: Diagnosis not present

## 2024-01-16 DIAGNOSIS — R0989 Other specified symptoms and signs involving the circulatory and respiratory systems: Secondary | ICD-10-CM | POA: Diagnosis not present

## 2024-01-16 DIAGNOSIS — Z8673 Personal history of transient ischemic attack (TIA), and cerebral infarction without residual deficits: Secondary | ICD-10-CM

## 2024-01-16 DIAGNOSIS — I700 Atherosclerosis of aorta: Secondary | ICD-10-CM | POA: Diagnosis not present

## 2024-01-16 DIAGNOSIS — I639 Cerebral infarction, unspecified: Secondary | ICD-10-CM | POA: Diagnosis not present

## 2024-01-16 DIAGNOSIS — K219 Gastro-esophageal reflux disease without esophagitis: Secondary | ICD-10-CM | POA: Insufficient documentation

## 2024-01-16 DIAGNOSIS — N139 Obstructive and reflux uropathy, unspecified: Secondary | ICD-10-CM | POA: Diagnosis not present

## 2024-01-16 DIAGNOSIS — R29818 Other symptoms and signs involving the nervous system: Secondary | ICD-10-CM | POA: Diagnosis not present

## 2024-01-16 DIAGNOSIS — I6381 Other cerebral infarction due to occlusion or stenosis of small artery: Principal | ICD-10-CM | POA: Insufficient documentation

## 2024-01-16 LAB — CBC WITH DIFFERENTIAL/PLATELET
Abs Immature Granulocytes: 0.03 K/uL (ref 0.00–0.07)
Basophils Absolute: 0 K/uL (ref 0.0–0.1)
Basophils Relative: 1 %
Eosinophils Absolute: 0.1 K/uL (ref 0.0–0.5)
Eosinophils Relative: 1 %
HCT: 32.1 % — ABNORMAL LOW (ref 39.0–52.0)
Hemoglobin: 10 g/dL — ABNORMAL LOW (ref 13.0–17.0)
Immature Granulocytes: 0 %
Lymphocytes Relative: 22 %
Lymphs Abs: 1.8 K/uL (ref 0.7–4.0)
MCH: 28.7 pg (ref 26.0–34.0)
MCHC: 31.2 g/dL (ref 30.0–36.0)
MCV: 92.2 fL (ref 80.0–100.0)
Monocytes Absolute: 0.6 K/uL (ref 0.1–1.0)
Monocytes Relative: 7 %
Neutro Abs: 5.9 K/uL (ref 1.7–7.7)
Neutrophils Relative %: 69 %
Platelets: 209 K/uL (ref 150–400)
RBC: 3.48 MIL/uL — ABNORMAL LOW (ref 4.22–5.81)
RDW: 18.4 % — ABNORMAL HIGH (ref 11.5–15.5)
WBC: 8.5 K/uL (ref 4.0–10.5)
nRBC: 0 % (ref 0.0–0.2)

## 2024-01-16 LAB — URINALYSIS, ROUTINE W REFLEX MICROSCOPIC
Bilirubin Urine: NEGATIVE
Glucose, UA: NEGATIVE mg/dL
Hgb urine dipstick: NEGATIVE
Ketones, ur: NEGATIVE mg/dL
Nitrite: POSITIVE — AB
Protein, ur: 30 mg/dL — AB
Specific Gravity, Urine: 1.019 (ref 1.005–1.030)
WBC, UA: >50 WBC/hpf (ref 0–5)
pH: 5 (ref 5.0–8.0)

## 2024-01-16 LAB — COMPREHENSIVE METABOLIC PANEL WITH GFR
ALT: 20 U/L (ref 0–44)
AST: 27 U/L (ref 15–41)
Albumin: 3.5 g/dL (ref 3.5–5.0)
Alkaline Phosphatase: 105 U/L (ref 38–126)
Anion gap: 13 (ref 5–15)
BUN: 13 mg/dL (ref 8–23)
CO2: 21 mmol/L — ABNORMAL LOW (ref 22–32)
Calcium: 9.4 mg/dL (ref 8.9–10.3)
Chloride: 104 mmol/L (ref 98–111)
Creatinine, Ser: 0.74 mg/dL (ref 0.61–1.24)
GFR, Estimated: >60 mL/min (ref >60)
Glucose, Bld: 164 mg/dL — ABNORMAL HIGH (ref 70–99)
Potassium: 4.1 mmol/L (ref 3.5–5.1)
Sodium: 138 mmol/L (ref 135–145)
Total Bilirubin: 1.2 mg/dL (ref 0.0–1.2)
Total Protein: 5.7 g/dL — ABNORMAL LOW (ref 6.5–8.1)

## 2024-01-16 LAB — PRO BRAIN NATRIURETIC PEPTIDE: Pro Brain Natriuretic Peptide: 465 pg/mL — ABNORMAL HIGH (ref <300.0)

## 2024-01-16 MED ORDER — ACETAMINOPHEN 650 MG RE SUPP: 650.0000 mg | Freq: Four times a day (QID) | RECTAL | Status: DC | PRN

## 2024-01-16 MED ORDER — SODIUM CHLORIDE 0.9 % IV BOLUS
500.0000 mL | Freq: Once | INTRAVENOUS | Status: AC
  Administered 2024-01-16: 500 mL via INTRAVENOUS

## 2024-01-16 MED ORDER — PANTOPRAZOLE SODIUM 40 MG PO TBEC
40.0000 mg | DELAYED_RELEASE_TABLET | Freq: Every day | ORAL | Status: DC
  Administered 2024-01-17 – 2024-01-23 (×7): 40 mg via ORAL
  Filled 2024-01-16 (×7): qty 1

## 2024-01-16 MED ORDER — SODIUM CHLORIDE 0.9 % IV SOLN
1.0000 g | Freq: Once | INTRAVENOUS | Status: AC
  Administered 2024-01-16: 1 g via INTRAVENOUS
  Filled 2024-01-16: qty 10

## 2024-01-16 MED ORDER — ACETAMINOPHEN 325 MG PO TABS
650.0000 mg | ORAL_TABLET | Freq: Four times a day (QID) | ORAL | Status: DC | PRN
  Administered 2024-01-22: 650 mg via ORAL
  Filled 2024-01-16: qty 2

## 2024-01-16 MED ORDER — ENOXAPARIN SODIUM 40 MG/0.4ML IJ SOSY
40.0000 mg | PREFILLED_SYRINGE | INTRAMUSCULAR | Status: DC
  Administered 2024-01-17 – 2024-01-24 (×8): 40 mg via SUBCUTANEOUS
  Filled 2024-01-16 (×8): qty 0.4

## 2024-01-16 MED ORDER — SODIUM CHLORIDE 0.9 % IV SOLN: INTRAVENOUS | Status: DC

## 2024-01-16 MED ORDER — SODIUM CHLORIDE 0.9 % IV SOLN
1.0000 g | INTRAVENOUS | Status: DC
  Administered 2024-01-17 – 2024-01-18 (×2): 1 g via INTRAVENOUS
  Filled 2024-01-16 (×2): qty 10

## 2024-01-16 MED ORDER — CLOPIDOGREL BISULFATE 75 MG PO TABS
75.0000 mg | ORAL_TABLET | Freq: Every day | ORAL | Status: DC
  Administered 2024-01-17 – 2024-01-24 (×8): 75 mg via ORAL
  Filled 2024-01-16 (×8): qty 1

## 2024-01-16 MED ORDER — BISACODYL 5 MG PO TBEC: 5.0000 mg | DELAYED_RELEASE_TABLET | Freq: Every day | ORAL | Status: DC | PRN

## 2024-01-16 MED ORDER — ONDANSETRON HCL 4 MG PO TABS: 4.0000 mg | ORAL_TABLET | Freq: Four times a day (QID) | ORAL | Status: DC | PRN

## 2024-01-16 MED ORDER — ATORVASTATIN CALCIUM 80 MG PO TABS
80.0000 mg | ORAL_TABLET | Freq: Every day | ORAL | Status: DC
  Administered 2024-01-17 – 2024-01-23 (×7): 80 mg via ORAL
  Filled 2024-01-16: qty 2
  Filled 2024-01-16 (×6): qty 1

## 2024-01-16 MED ORDER — LOSARTAN POTASSIUM 25 MG PO TABS
25.0000 mg | ORAL_TABLET | Freq: Every day | ORAL | Status: DC
  Administered 2024-01-17: 25 mg via ORAL
  Filled 2024-01-16: qty 1

## 2024-01-16 MED ORDER — SENNOSIDES-DOCUSATE SODIUM 8.6-50 MG PO TABS: 1.0000 | ORAL_TABLET | Freq: Every evening | ORAL | Status: DC | PRN

## 2024-01-16 MED ORDER — ONDANSETRON HCL 4 MG/2ML IJ SOLN: 4.0000 mg | Freq: Four times a day (QID) | INTRAMUSCULAR | Status: DC | PRN

## 2024-01-16 NOTE — Plan of Care (Signed)
 Brief phone discussion with Dr. Zackowski.  Briefly, [name] is a 78 y.o. male with hx of cognitive decline, prior multiple lacunar infarcts on plavix , BPH, depression, diabetes, vertigo, hypertension, GERD, breast cancer status post chemotherapy and surgery, hyperlipidemia and neuropathy who is brought in by his wife with LUE and LLE weakness with unclear LWKW.  Per discussion with EDP, was recently discharged from Hospital and went to [facility]. Today was discharged from rehab to home and wife unable to get him out of car. Pt unable to clarify LKW secondary to his cognitive decline.  Concen that he might have had a new stroke or recrudescence of prior stroke symptoms. EDP planning on admission as felt that patient would be unable to care for himself.  [facility] does not have MRI overnight. So I recommended getting MRI Brain and C spine without contrast and if it shows an acute stroke, or other significant abnormality, please reach out to us .  Ijanae Macapagal [facility]

## 2024-01-16 NOTE — ED Provider Notes (Addendum)
 [place] EMERGENCY DEPARTMENT AT [facility] Provider Note   CSN: [id number] Arrival date & time: 01/16/24  1435     Patient presents with: Hypotension   John [name] is a 78 y.o. male.   Patient was admitted to the hospital from September 15 to September 22 mostly for intractable nausea vomiting.  Patient's had previous head CTs MRIs showing basilar ganglier lacunar infarcts.  Patient was discharged from the hospital stay to [facility].  Was discharged from there today.  They put him in the car when wife got him home he was too weak with his left upper extremity left lower extremity get out of the car.  We do not know when this weakness started because she said they did not walk him much at [facility] but they did walk him 15 minutes a day this is the first that she knew there was any problem with his left-sided weakness.  She brought him here because with this weakness she cannot care for him at home.  Past medical history also significant for hypertension hyperlipidemia type 2 diabetes history of a CVA cognitive deficits coronary artery disease previous DVT no longer anticoagulated urinary retention with Foley catheter in place recent admissions with UTI bacteremia and pneumonia but he was admitted this time in September for abdominal pain nausea and vomiting.  Best patient's wife can state is that he did not have this weakness when he went to [facility].  She assumed it happened maybe last evening but she does not really know.       Prior to Admission medications   Medication Sig Start Date End Date Taking? Authorizing Provider  acetaminophen  (TYLENOL ) 325 MG tablet Take 2 tablets (650 mg total) by mouth every 6 (six) hours as needed for mild pain (pain score 1-3) or fever (or Fever >/= 101). 12/30/23   Sherrill Cable Latif, DO  Ascorbic Acid  (VITAMIN C) 1000 MG tablet Take 1,000 mg by mouth 2 (two) times a week.    [provider]  atorvastatin  (LIPITOR ) 80 MG  tablet Take 1 tablet (80 mg total) by mouth at bedtime. 09/14/23   [name], DO  clopidogrel  (PLAVIX ) 75 MG tablet Take 1 tablet (75 mg total) by mouth daily with breakfast. 10/11/23   [name], DO  clotrimazole -betamethasone  (LOTRISONE ) cream Apply 1 Application topically daily. Patient taking differently: Apply 1 Application topically daily as needed (rash). 07/11/23   [name], DO  cyanocobalamin  1000 MCG tablet Take 1 tablet (1,000 mcg total) by mouth daily. Patient taking differently: Take 1,000 mcg by mouth 3 (three) times a week. 09/14/23   [name], DO  escitalopram  (LEXAPRO ) 10 MG tablet Take 1 tablet (10 mg total) by mouth daily. 10/03/23   [name], DO  ferrous sulfate  (FEROSUL) 325 (65 FE) MG tablet Take 1 tablet (325 mg total) by mouth daily with breakfast. 11/28/23   Frann, Mabel Mt, DO  finasteride  (PROSCAR ) 5 MG tablet Take 1 tablet (5 mg total) by mouth daily. *Need appointment for future refills.* 11/28/23   Wendling, Mabel Mt, DO  hyoscyamine  (LEVSIN  SL) 0.125 MG SL tablet Place 1 tablet (0.125 mg total) under the tongue every 4 (four) hours as needed for bladder spasms. 08/17/23   Angiulli, Toribio PARAS, PA-C  insulin  glargine (LANTUS ) 100 UNIT/ML injection Inject 0.15 mLs (15 Units total) into the skin daily. 01/02/24   Leotis Bogus, MD  losartan  (COZAAR ) 25 MG tablet Take 1 tablet (25  mg total) by mouth daily. 12/23/23   Jillian Buttery, MD  metFORMIN  (GLUCOPHAGE ) 500 MG tablet Take 1 tablet (500 mg total) by mouth 2 (two) times daily with a meal. 12/22/23   Jillian Buttery, MD  Multiple Vitamin (MULTIVITAMIN) tablet Take 1 tablet by mouth 2 (two) times a week.    [provider]  ondansetron  (ZOFRAN -ODT) 4 MG disintegrating tablet Take 1 tablet (4 mg total) by mouth every 8 (eight) hours as needed for nausea or vomiting. 12/23/23   Frann, Mabel Mt, DO  pantoprazole  (PROTONIX ) 40 MG tablet  Take 1 tablet (40 mg total) by mouth at bedtime. 12/19/23   [name], DO  polyethylene glycol (MIRALAX  / GLYCOLAX ) 17 g packet Take 17 g by mouth daily. 12/30/23   Sheikh, Omair Latif, DO  senna-docusate (SENOKOT-S) 8.6-50 MG tablet Take 1 tablet by mouth 2 (two) times daily. Patient taking differently: Take 1 tablet by mouth daily as needed for mild constipation or moderate constipation. 12/15/23   Lue [name], MD  amLODipine  (NORVASC ) 2.5 MG tablet Take 1 tablet (2.5 mg total) by mouth daily. 06/15/23 06/30/23  [name], DO  apixaban  (ELIQUIS ) 5 MG TABS tablet Take 1 tablet (5 mg total) by mouth 2 (two) times daily. 10/11/23 10/11/23  [name], DO    Allergies: Ramipril, Adhesive [tape], Other, and Sertraline    Review of Systems  Constitutional:  Negative for chills and fever.  HENT:  Negative for ear pain and sore throat.   Eyes:  Negative for pain and visual disturbance.  Respiratory:  Negative for cough and shortness of breath.   Cardiovascular:  Negative for chest pain and palpitations.  Gastrointestinal:  Negative for abdominal pain and vomiting.  Genitourinary:  Negative for dysuria and hematuria.  Musculoskeletal:  Negative for arthralgias and back pain.  Skin:  Negative for color change and rash.  Neurological:  Positive for weakness. Negative for seizures and syncope.  All other systems reviewed and are negative.   Updated Vital Signs BP (!) 155/74   Pulse 67   Temp 98.8 F (37.1 C)   Resp 15   SpO2 99%   Physical Exam Vitals and nursing note reviewed.  Constitutional:      General: He is not in acute distress.    Appearance: Normal appearance. He is well-developed.  HENT:     Head: Normocephalic and atraumatic.  Eyes:     Extraocular Movements: Extraocular movements intact.     Conjunctiva/sclera: Conjunctivae normal.     Pupils: Pupils are equal, round, and reactive to light.  Cardiovascular:     Rate and Rhythm:  Normal rate and regular rhythm.     Heart sounds: No murmur heard. Pulmonary:     Effort: Pulmonary effort is normal. No respiratory distress.     Breath sounds: Normal breath sounds.  Abdominal:     Palpations: Abdomen is soft.     Tenderness: There is no abdominal tenderness.  Musculoskeletal:        General: No swelling.     Cervical back: Normal range of motion and neck supple. No rigidity or tenderness.  Skin:    General: Skin is warm and dry.     Capillary Refill: Capillary refill takes less than 2 seconds.  Neurological:     Mental Status: He is alert. Mental status is at baseline.     Cranial Nerves: No cranial nerve deficit.     Sensory: No sensory deficit.     Motor:  Weakness present.     Comments: Definite weakness to left upper extremity and left lower extremity.  Right upper extremity right lower extremity strength is very good.  Best I can tell sensation is grossly intact.  No facial droop.  No facial weakness speech seems to be baseline according to wife.  Psychiatric:        Mood and Affect: Mood normal.     (all labs ordered are listed, but only abnormal results are displayed) Labs Reviewed  CBC WITH DIFFERENTIAL/PLATELET - Abnormal; Notable for the following components:      Result Value   RBC 3.48 (*)    Hemoglobin 10.0 (*)    HCT 32.1 (*)    RDW 18.4 (*)    All other components within normal limits  COMPREHENSIVE METABOLIC PANEL WITH GFR - Abnormal; Notable for the following components:   CO2 21 (*)    Glucose, Bld 164 (*)    Total Protein 5.7 (*)    All other components within normal limits  URINALYSIS, ROUTINE W REFLEX MICROSCOPIC - Abnormal; Notable for the following components:   APPearance TURBID (*)    Protein, ur 30 (*)    Nitrite POSITIVE (*)    Leukocytes,Ua MODERATE (*)    Bacteria, UA MANY (*)    All other components within normal limits  PRO BRAIN NATRIURETIC PEPTIDE - Abnormal; Notable for the following components:   Pro Brain  Natriuretic Peptide 465.0 (*)    All other components within normal limits  URINE CULTURE    EKG: EKG Interpretation Date/Time:  Monday January 16 2024 16:44:03 EDT Ventricular Rate:  68 PR Interval:    QRS Duration:  141 QT Interval:  425 QTC Calculation: 452 R Axis:   -73  Text Interpretation: Junctional rhythm Right bundle branch block Inferolateral infarct, age indeterminate No significant change since last tracing Confirmed by Rose Hegner [phone]) on 01/16/2024 4:48:22 PM  Radiology: CT Head Wo Contrast Result Date: 01/16/2024 CLINICAL DATA:  Neuro deficit, acute, stroke suspected Worsening left-sided weakness EXAM: CT HEAD WITHOUT CONTRAST TECHNIQUE: Contiguous axial images were obtained from the base of the skull through the vertex without intravenous contrast. RADIATION DOSE REDUCTION: This exam was performed according to the departmental dose-optimization program which includes automated exposure control, adjustment of the mA and/or kV according to patient size and/or use of iterative reconstruction technique. COMPARISON:  CT head 12/19/2023, CT head 12/13/2023 FINDINGS: Brain: Patchy and confluent areas of decreased attenuation are noted throughout the deep and periventricular white matter of the cerebral hemispheres bilaterally, compatible with chronic microvascular ischemic disease. Redemonstration of bilateral chronic basal ganglia lacunar infarctions. No evidence of large-territorial acute infarction. No parenchymal hemorrhage. No mass lesion. No extra-axial collection. No mass effect or midline shift. No hydrocephalus. Basilar cisterns are patent. Vascular: No hyperdense vessel. Atherosclerotic calcifications are present within the cavernous internal carotid and vertebral arteries. Skull: No acute fracture or focal lesion. Sinuses/Orbits: Paranasal sinuses and mastoid air cells are clear. Bilateral lens replacement. Otherwise the orbits are unremarkable. Other: None. IMPRESSION:  No acute intracranial abnormality. Electronically Signed   By: Morgane  Naveau M.D.   On: 01/16/2024 18:36   DG Chest Port 1 View Result Date: 01/16/2024 CLINICAL DATA:  Weakness EXAM: PORTABLE CHEST - 1 VIEW COMPARISON:  12/13/2023 FINDINGS: Low lung volumes. No focal airspace consolidation, pleural effusion, or pneumothorax. No cardiomegaly. Left chest pacemaker with leads terminating in the right atrium and right ventricle. Aortic atherosclerosis. No acute fracture or destructive lesions. Multilevel thoracic osteophytosis.  IMPRESSION: Low lung volumes.  Otherwise, no acute cardiopulmonary abnormality. Electronically Signed   By: Rogelia Myers M.D.   On: 01/16/2024 16:15     Procedures   Medications Ordered in the ED  0.9 %  sodium chloride  infusion ( Intravenous New Bag/Given 01/16/24 1947)  cefTRIAXone  (ROCEPHIN ) 1 g in sodium chloride  0.9 % 100 mL IVPB (has no administration in time range)  sodium chloride  0.9 % bolus 500 mL (0 mLs Intravenous Stopped 01/16/24 1944)                                    Medical Decision Making Amount and/or Complexity of Data Reviewed Labs: ordered. Radiology: ordered.  Risk Prescription drug management. Decision regarding hospitalization.   CBC white count 8.5 hemoglobin 10 and platelets 209.  Complete metabolic panel glucose 164 electrolytes normal.  Renal function normal LFTs normal.  BNP does have a history of CHF is 465.  CT head negative for any acute findings.  There is redemonstration of bilateral chronic basilar ganglier lacunar infarctions.  No evidence of any large territorial acute infarction.  Previous's head CT's MRIs consistent with lacunar infarcts.  Chest x-ray no acute findings.  Patient seems to be fairly stable urinalysis still pending.  Patient appears patient unless we have a urinary tract infection is not meeting criteria for readmission.  Patient's wife here now.  She states that when he was discharged on September 22  there was no left upper extremity left lower extremity weakness.  Not exactly sure when it started.  Because he did not really walk much at [facility].  But when she tried to get him out of the car he was definitely weak left upper extremity left lower extremity.  Known to have previous stroke.  Certainly documented by previous CTs-MRIs.  And today's head CT definitely shows the remote basal ganglia lacunar infarcts.  Nothing acute.  Patient's discharge summary from his recent hospitalization from September 15 to September 22 without any mention of focal findings.  Did have some cognitive impairment.  Urine has now been collected looks fairly clear.  Not sent off yet.  Will contact neurohospitalist for consultation feel the patient probably can be admitted to the medicine service and get MRI in the morning because MRIs not available here.  With no last known normal patient certainly out of TNK stroke window.  Does have some movement of left upper extremity and left lower extremity.  Patient possibly still could be a candidate for thrombectomy but again we do not know when this occurred.  I discussed with Saul on for neurology.  He is recommending MRI brain MRI C-spine I will order that recommending hospitalist admission.  Thinks that something probably did occur.  We just do not know when.  Urine looks abnormal.  Will culture and start Rocephin .  Discussed with hospitalist who will admit.  Final diagnoses:  Weakness  Cerebrovascular accident (CVA), unspecified mechanism (HCC)  Left-sided weakness  Acute cystitis without hematuria    ED Discharge Orders     None          Geraldene Hamilton, MD 01/16/24 RENARD Geraldene Hamilton, MD 01/16/24 1942    Geraldene Hamilton, MD 01/16/24 2014    Geraldene Hamilton, MD 01/16/24 [id number]    Geraldene Hamilton, MD 01/16/24 2042

## 2024-01-16 NOTE — ED Triage Notes (Signed)
 Patient BIB EMS for evaluation of hypotension.  Reports patient was just released from [facility].  On arrival home, patient was unable to get out of car, stand, and ambulate.  EMS states that there are steps to all entrances of house and patient is unable to walk up stairs. Hx of stroke with L sided weakness.

## 2024-01-16 NOTE — H&P (Signed)
 History and Physical  John [name] FMW:[id number] DOB: July 14, 1945 DOA: 01/16/2024  PCP: [name], DO   Chief Complaint: Left-sided weakness  HPI: John [name] is a 78 y.o. male with medical history significant for hypertension, hyperlipidemia, type 2 diabetes mellitus, history of CVA, cognitive deficits, CAD, heart block s/p PPM, chronic HFrEF, provoked DVT no longer anticoagulated, BPH, urinary retention with Foley catheter, and recent admissions with UTI, bacteremia, pneumonia, nausea and vomiting and discharged to SNF now presenting with left-sided weakness. Patient reports that he did not have a great experience at [facility]. At discharge today, they were able to get him into the car however when they arrived at the house, he was unable to get out of the car.  Spouse noticed patient was having weakness in his left upper and lower extremities. Patient unsure when the weakness started but thinks is likely sometime today.  He endorses some numbness in his lower extremities from his chronic neuropathy but denies any new numbness and tingling, vision changes, slurred speech, chest pain, shortness of breath, falls, abdominal pain, nausea, vomiting, fever or chills.  ED Course: Initial vitals show patient afebrile and normotensive. Initial labs significant for glucose 164, procalcitonin 465 (normal for age), WBC 8.5, Hgb 10.0, UA shows moderate leuks, positive nitrite, WBC >50 and many bacteria, presence of budding yeast. EKG shows junctional rhythm with RBBB. CXR shows no active disease.  CT head with no acute intracranial malady.  Pt received IV NS 500 cc bolus and IV Rocephin .  Neurology was consulted for evaluation. TR[facility] was consulted for admission.   Review of Systems: Please see HPI for pertinent positives and negatives. A complete 10 system review of systems are otherwise negative.  Past Medical History:  Diagnosis Date   BPH (benign prostatic hyperplasia)    CTS (carpal  tunnel syndrome)    Depression    Diabetes mellitus    Essential hypertension    GERD (gastroesophageal reflux disease)    History of breast cancer    Hypercholesteremia    Neuropathy    Stroke [facility])    Past Surgical History:  Procedure Laterality Date   CATARACT EXTRACTION, BILATERAL  01/18/2017   CORONARY STENT INTERVENTION N/A 08/10/2023   Procedure: CORONARY STENT INTERVENTION;  Surgeon: Darron Deatrice LABOR, MD;  Location: [facility] INVASIVE CV LAB;  Service: Cardiovascular;  Laterality: N/A;   LEFT HEART CATH AND CORONARY ANGIOGRAPHY N/A 08/10/2023   Procedure: LEFT HEART CATH AND CORONARY ANGIOGRAPHY;  Surgeon: Darron Deatrice LABOR, MD;  Location: [facility] INVASIVE CV LAB;  Service: Cardiovascular;  Laterality: N/A;   LITHOTRIPSY     MASTECTOMY Right 05/02/2018   PACEMAKER IMPLANT N/A 05/13/2023   Procedure: PACEMAKER IMPLANT;  Surgeon: Kennyth Chew, MD;  Location: [facility] INVASIVE CV LAB;  Service: Cardiovascular;  Laterality: N/A;   TEMPORARY PACEMAKER N/A 05/11/2023   Procedure: TEMPORARY PACEMAKER;  Surgeon: [place]az[place], Peter M, MD;  Location: [facility] INVASIVE CV LAB;  Service: Cardiovascular;  Laterality: N/A;   TONSILLECTOMY AND ADENOIDECTOMY     TRANSESOPHAGEAL ECHOCARDIOGRAM (CATH LAB) N/A 12/08/2023   Procedure: TRANSESOPHAGEAL ECHOCARDIOGRAM;  Surgeon: Raford Riggs, MD;  Location: [facility] INVASIVE CV LAB;  Service: Cardiovascular;  Laterality: N/A;   Social History:  reports that he has never smoked. He has never used smokeless tobacco. He reports that he does not drink alcohol and does not use drugs.  Allergies  Allergen Reactions   Ramipril Anaphylaxis   Adhesive [Tape] Rash   Other Diarrhea    Severe intolerance to  Chemotherapy in the past.   Sertraline Other (See Comments)    Extreme headaches    Family History  Problem Relation Age of Onset   Lymphoma Mother    Cancer Mother    Diabetes Father    Stroke Father    Ovarian cancer Sister    Cancer Sister    Cancer Paternal Grandmother       Prior to Admission medications   Medication Sig Start Date End Date Taking? Authorizing Provider  acetaminophen  (TYLENOL ) 325 MG tablet Take 2 tablets (650 mg total) by mouth every 6 (six) hours as needed for mild pain (pain score 1-3) or fever (or Fever >/= 101). 12/30/23   Sherrill Cable Latif, DO  Ascorbic Acid  (VITAMIN C) 1000 MG tablet Take 1,000 mg by mouth 2 (two) times a week.    [provider]  atorvastatin  (LIPITOR ) 80 MG tablet Take 1 tablet (80 mg total) by mouth at bedtime. 09/14/23   [name], DO  clopidogrel  (PLAVIX ) 75 MG tablet Take 1 tablet (75 mg total) by mouth daily with breakfast. 10/11/23   Wendling, Mabel Mt, DO  clotrimazole -betamethasone  (LOTRISONE ) cream Apply 1 Application topically daily. Patient taking differently: Apply 1 Application topically daily as needed (rash). 07/11/23   [name], DO  cyanocobalamin  1000 MCG tablet Take 1 tablet (1,000 mcg total) by mouth daily. Patient taking differently: Take 1,000 mcg by mouth 3 (three) times a week. 09/14/23   [name], DO  escitalopram  (LEXAPRO ) 10 MG tablet Take 1 tablet (10 mg total) by mouth daily. 10/03/23   [name], DO  ferrous sulfate  (FEROSUL) 325 (65 FE) MG tablet Take 1 tablet (325 mg total) by mouth daily with breakfast. 11/28/23   Frann, Mabel Mt, DO  finasteride  (PROSCAR ) 5 MG tablet Take 1 tablet (5 mg total) by mouth daily. *Need appointment for future refills.* 11/28/23   Wendling, Mabel Mt, DO  hyoscyamine  (LEVSIN  SL) 0.125 MG SL tablet Place 1 tablet (0.125 mg total) under the tongue every 4 (four) hours as needed for bladder spasms. 08/17/23   Angiulli, Toribio PARAS, PA-C  insulin  glargine (LANTUS ) 100 UNIT/ML injection Inject 0.15 mLs (15 Units total) into the skin daily. 01/02/24   Leotis Bogus, MD  losartan  (COZAAR ) 25 MG tablet Take 1 tablet (25 mg total) by mouth daily. 12/23/23   Jillian Buttery, MD  metFORMIN  (GLUCOPHAGE )  500 MG tablet Take 1 tablet (500 mg total) by mouth 2 (two) times daily with a meal. 12/22/23   Jillian Buttery, MD  Multiple Vitamin (MULTIVITAMIN) tablet Take 1 tablet by mouth 2 (two) times a week.    [provider]  ondansetron  (ZOFRAN -ODT) 4 MG disintegrating tablet Take 1 tablet (4 mg total) by mouth every 8 (eight) hours as needed for nausea or vomiting. 12/23/23   Frann, Mabel Mt, DO  pantoprazole  (PROTONIX ) 40 MG tablet Take 1 tablet (40 mg total) by mouth at bedtime. 12/19/23   [name], DO  polyethylene glycol (MIRALAX  / GLYCOLAX ) 17 g packet Take 17 g by mouth daily. 12/30/23   Sheikh, Omair Latif, DO  senna-docusate (SENOKOT-S) 8.6-50 MG tablet Take 1 tablet by mouth 2 (two) times daily. Patient taking differently: Take 1 tablet by mouth daily as needed for mild constipation or moderate constipation. 12/15/23   Lue [name], MD  amLODipine  (NORVASC ) 2.5 MG tablet Take 1 tablet (2.5 mg total) by mouth daily. 06/15/23 06/30/23  [name], DO  apixaban  (ELIQUIS ) 5 MG TABS  tablet Take 1 tablet (5 mg total) by mouth 2 (two) times daily. 10/11/23 10/11/23  [name], DO    Physical Exam: BP (!) 155/74   Pulse 67   Temp 98.8 F (37.1 C)   Resp 15   SpO2 99%  General: Pleasant, chronically ill elderly man laying in bed. No acute distress. HEENT: [place]/AT. Anicteric sclera. PERRLA. EOMI. CV: RRR. No murmurs, rubs, or gallops. No LE edema Pulmonary: Lungs CTAB. Normal effort. No wheezing or rales. Abdominal: Soft, nontender, nondistended. Normal bowel sounds. Extremities: Palpable radial and DP pulses. Normal ROM. Skin: Warm and dry. No obvious rash or lesions. GU: Foley catheter stable in place with drainage of yellowish urine Neuro: A&Ox3. Moves all extremities. Weak grip strength with the left hand. Strength  5/5 in the RUE/RLE, 3/5 in LUE/LLE. FTN slower with the left hand.  Normal sensation to light touch. Psych: Normal mood and  affect          Labs on Admission:  Basic Metabolic Panel: Recent Labs  Lab 01/16/24 1659  NA 138  K 4.1  CL 104  CO2 21*  GLUCOSE 164*  BUN 13  CREATININE 0.74  CALCIUM  9.4   Liver Function Tests: Recent Labs  Lab 01/16/24 1659  AST 27  ALT 20  ALKPHOS 105  BILITOT 1.2  PROT 5.7*  ALBUMIN 3.5   No results for input(s): LIPASE, AMYLASE in the last 168 hours. No results for input(s): AMMONIA in the last 168 hours. CBC: Recent Labs  Lab 01/16/24 1659  WBC 8.5  NEUTROABS 5.9  HGB 10.0*  HCT 32.1*  MCV 92.2  PLT 209   Cardiac Enzymes: No results for input(s): CKTOTAL, CKMB, CKMBINDEX, TROPONINI in the last 168 hours. BNP (last 3 results) Recent Labs    08/06/23 1713  BNP 307.2*    ProBNP (last 3 results) Recent Labs    01/16/24 1659  PROBNP 465.0*    CBG: No results for input(s): GLUCAP in the last 168 hours.  Radiological Exams on Admission: CT Head Wo Contrast Result Date: 01/16/2024 CLINICAL DATA:  Neuro deficit, acute, stroke suspected Worsening left-sided weakness EXAM: CT HEAD WITHOUT CONTRAST TECHNIQUE: Contiguous axial images were obtained from the base of the skull through the vertex without intravenous contrast. RADIATION DOSE REDUCTION: This exam was performed according to the departmental dose-optimization program which includes automated exposure control, adjustment of the mA and/or kV according to patient size and/or use of iterative reconstruction technique. COMPARISON:  CT head 12/19/2023, CT head 12/13/2023 FINDINGS: Brain: Patchy and confluent areas of decreased attenuation are noted throughout the deep and periventricular white matter of the cerebral hemispheres bilaterally, compatible with chronic microvascular ischemic disease. Redemonstration of bilateral chronic basal ganglia lacunar infarctions. No evidence of large-territorial acute infarction. No parenchymal hemorrhage. No mass lesion. No extra-axial collection. No  mass effect or midline shift. No hydrocephalus. Basilar cisterns are patent. Vascular: No hyperdense vessel. Atherosclerotic calcifications are present within the cavernous internal carotid and vertebral arteries. Skull: No acute fracture or focal lesion. Sinuses/Orbits: Paranasal sinuses and mastoid air cells are clear. Bilateral lens replacement. Otherwise the orbits are unremarkable. Other: None. IMPRESSION: No acute intracranial abnormality. Electronically Signed   By: Morgane  Naveau M.D.   On: 01/16/2024 18:36   DG Chest Port 1 View Result Date: 01/16/2024 CLINICAL DATA:  Weakness EXAM: PORTABLE CHEST - 1 VIEW COMPARISON:  12/13/2023 FINDINGS: Low lung volumes. No focal airspace consolidation, pleural effusion, or pneumothorax. No cardiomegaly. Left chest pacemaker with leads  terminating in the right atrium and right ventricle. Aortic atherosclerosis. No acute fracture or destructive lesions. Multilevel thoracic osteophytosis. IMPRESSION: Low lung volumes.  Otherwise, no acute cardiopulmonary abnormality. Electronically Signed   By: Rogelia Myers M.D.   On: 01/16/2024 16:15   Assessment/Plan John [name] is a 78 y.o. male with medical history significant for hypertension, hyperlipidemia, type 2 diabetes mellitus, history of CVA, cognitive deficits, CAD, heart block s/p PPM chronic HFrEF, provoked DVT no longer anticoagulated, BPH, urinary retention with Foley catheter, and recent admissions with UTI, bacteremia, pneumonia, nausea and vomiting and discharged to SNF now presenting with left-sided weakness.   # Left-sided weakness - Pt with history of multiple lacunar infarcts on Plavix  now presented with left-sided weakness of unclear last well-known - On exam patient with noticeable weakness on the LUE and LLE but normal sensation - CT head negative for acute intracranial abnormality - Neurology consulted by EDP, recommends MRI brain and MRI C-spine and reconsult neurology if positive -  Follow-up MRI brain and MRI C-spine - PT/OT eval and treat  # Acute cystitis - Pt presented with left-sided weakness - Urinalysis shows signs of infection - Continue IV rocephin  - F/u urine culture - Trend CBC and fever curve  # T2DM - Last A1c 7.5% in April 2025 - SSI with meals, CBG monitoring  # HTN # HFrEF - BP stable with SBP in the 110s to 130s - Last TTE on 11/2023 showed EF 30-35%, LV global hypokinesis and mild protruding plaque involving the aortic root - Patient euvolemic on exam - Continue losartan   # Hx of CVA - History of multiple lacunar infarcts - Continue Plavix  and atorvastatin   # History of urinary retention # BPH - Continue Foley catheter  # Heart block s/p PPM - Will have to ensure pacemaker is MRI compatible - Telemetry  # CAD - Continue Plavix  and atorvastatin   # HLD -Continue atorvastatin    # GERD - Continue Protonix    DVT prophylaxis: Lovenox      Code Status: Limited: Do not attempt resuscitation (DNR) -DNR-LIMITED -Do Not Intubate/DNI   Consults called: Neurology  Family Communication: No family at bedside  Severity of Illness: The appropriate patient status for this patient is OBSERVATION. Observation status is judged to be reasonable and necessary in order to provide the required intensity of service to ensure the patient's safety. The patient's presenting symptoms, physical exam findings, and initial radiographic and laboratory data in the context of their medical condition is felt to place them at decreased risk for further clinical deterioration. Furthermore, it is anticipated that the patient will be medically stable for discharge from the hospital within 2 midnights of admission.   Level of care: Progressive    Lou Claretta HERO, MD 01/16/2024, 11:27 PM [facility] Pager: [phone] Isaiah 41:10   If 7PM-7AM, please contact night-coverage [email or web address] Password [email or web address]

## 2024-01-16 NOTE — ED Triage Notes (Signed)
 Spoke with [employer] and apparently he was discharged due to insurance payments ending. Family appealed this and the appeal was denied.

## 2024-01-17 ENCOUNTER — Encounter (HOSPITAL_COMMUNITY): Payer: Self-pay | Admitting: Student

## 2024-01-17 DIAGNOSIS — Z8673 Personal history of transient ischemic attack (TIA), and cerebral infarction without residual deficits: Secondary | ICD-10-CM

## 2024-01-17 DIAGNOSIS — R531 Weakness: Principal | ICD-10-CM

## 2024-01-17 DIAGNOSIS — N3000 Acute cystitis without hematuria: Secondary | ICD-10-CM

## 2024-01-17 LAB — CBC
HCT: 36.2 % — ABNORMAL LOW (ref 39.0–52.0)
Hemoglobin: 11.9 g/dL — ABNORMAL LOW (ref 13.0–17.0)
MCH: 29.3 pg (ref 26.0–34.0)
MCHC: 32.9 g/dL (ref 30.0–36.0)
MCV: 89.2 fL (ref 80.0–100.0)
Platelets: 230 K/uL (ref 150–400)
RBC: 4.06 MIL/uL — ABNORMAL LOW (ref 4.22–5.81)
RDW: 18.3 % — ABNORMAL HIGH (ref 11.5–15.5)
WBC: 8.1 K/uL (ref 4.0–10.5)
nRBC: 0 % (ref 0.0–0.2)

## 2024-01-17 LAB — HEMOGLOBIN A1C
Hgb A1c MFr Bld: 8.5 % — ABNORMAL HIGH (ref 4.8–5.6)
Mean Plasma Glucose: 197.25 mg/dL

## 2024-01-17 LAB — CBG MONITORING, ED
Glucose-Capillary: 176 mg/dL — ABNORMAL HIGH (ref 70–99)
Glucose-Capillary: 168 mg/dL — ABNORMAL HIGH (ref 70–99)
Glucose-Capillary: 179 mg/dL — ABNORMAL HIGH (ref 70–99)

## 2024-01-17 LAB — BASIC METABOLIC PANEL WITH GFR
Anion gap: 10 (ref 5–15)
BUN: 11 mg/dL (ref 8–23)
CO2: 22 mmol/L (ref 22–32)
Calcium: 8.2 mg/dL — ABNORMAL LOW (ref 8.9–10.3)
Chloride: 109 mmol/L (ref 98–111)
Creatinine, Ser: 0.55 mg/dL — ABNORMAL LOW (ref 0.61–1.24)
GFR, Estimated: >60 mL/min (ref >60)
Glucose, Bld: 143 mg/dL — ABNORMAL HIGH (ref 70–99)
Potassium: 3.6 mmol/L (ref 3.5–5.1)
Sodium: 141 mmol/L (ref 135–145)

## 2024-01-17 MED ORDER — INSULIN ASPART 100 UNIT/ML IJ SOLN
0.0000 [IU] | Freq: Three times a day (TID) | INTRAMUSCULAR | Status: DC
  Administered 2024-01-17 – 2024-01-18 (×4): 2 [IU] via SUBCUTANEOUS
  Administered 2024-01-18: 7 [IU] via SUBCUTANEOUS
  Administered 2024-01-18: 3 [IU] via SUBCUTANEOUS
  Administered 2024-01-19: 2 [IU] via SUBCUTANEOUS
  Administered 2024-01-19: 3 [IU] via SUBCUTANEOUS
  Administered 2024-01-19: 2 [IU] via SUBCUTANEOUS
  Administered 2024-01-20: 7 [IU] via SUBCUTANEOUS
  Administered 2024-01-20 – 2024-01-21 (×4): 3 [IU] via SUBCUTANEOUS
  Administered 2024-01-21: 2 [IU] via SUBCUTANEOUS
  Administered 2024-01-22 (×2): 7 [IU] via SUBCUTANEOUS
  Administered 2024-01-22: 2 [IU] via SUBCUTANEOUS
  Administered 2024-01-23: 5 [IU] via SUBCUTANEOUS
  Administered 2024-01-23: 2 [IU] via SUBCUTANEOUS
  Administered 2024-01-23 – 2024-01-24 (×2): 5 [IU] via SUBCUTANEOUS
  Filled 2024-01-17: qty 0.09

## 2024-01-17 NOTE — Progress Notes (Signed)
  PT Cancellation Note  Patient Details Name: John [name] MRN: [id number] DOB: 1945-04-26   Cancelled Treatment:    Reason Eval/Treat Not Completed: Other (comment) To transfer to [facility]. John [name] PT Acute Rehabilitation Services Office [phone]    [name] [name] 01/17/2024, 5:03 PM

## 2024-01-17 NOTE — ED Notes (Signed)
 Breakfast tray given to patient.

## 2024-01-17 NOTE — Progress Notes (Signed)
 PROGRESS NOTE    John [name]  FMW:[id number] DOB: 1945/08/11 DOA: 01/16/2024 PCP: [name], DO    Brief Narrative:  John [name] is a 78 y.o. male with medical history significant for hypertension, hyperlipidemia, type 2 diabetes mellitus, history of CVA, cognitive deficits, CAD, heart block s/p PPM chronic HFrEF, provoked DVT no longer anticoagulated, BPH, urinary retention with Foley catheter, and recent admissions with UTI, bacteremia, pneumonia, nausea and vomiting and discharged to SNF now presenting with left-sided weakness.    Assessment and Plan:  Left-sided weakness - Pt with history of multiple lacunar infarcts on Plavix  now presented with left-sided weakness of unclear last well-known - On exam patient with noticeable weakness on the LUE and LLE but normal sensation - CT head negative for acute intracranial abnormality - Neurology consulted by EDP, recommends MRI brain and MRI C-spine and reconsult neurology if positive - Follow-up MRI brain and MRI C-spine-- [facility] to [facility] done [facility] -of note multiple recent hospitalizations-- including Aug 2025 with bacteremia in the presence of pacemaker  - PT/OT eval and treat   Acute cystitis - Pt presented with left-sided weakness - Urinalysis shows signs of infection - Continue IV rocephin  - F/u urine culture-- most recent cultures with yeast - Trend CBC and fever curve   T2DM - Last A1c 7.5% in April 2025 - SSI with meals, CBG monitoring    HTN/HFrEF - Last TTE on 11/2023 showed EF 30-35%, LV global hypokinesis and mild protruding plaque involving the aortic root - Patient euvolemic on exam - Continue losartan    Hx of CVA - History of multiple lacunar infarcts - Continue Plavix  and atorvastatin    History of urinary retention/ BPH - Continue Foley catheter-- will need outpatient follow up   Heart block s/p PPM - has pacemaker    CAD - Continue Plavix  and atorvastatin     HLD -atorvastatin     GERD - Continue Protonix    DVT prophylaxis: enoxaparin  (LOVENOX ) injection 40 mg Start: 01/17/24 1000    Code Status: Limited: Do not attempt resuscitation (DNR) -DNR-LIMITED -Do Not Intubate/DNI  Family Communication: called wife x 2 with no answer  Disposition Plan:  Level of care: Telemetry Medical Status is: Observation     Consultants:  None-- if MRI positive, call neurology   Subjective: Still having trouble lifting left leg, denies lower back pain  Objective: Vitals:   01/17/24 0358 01/17/24 0430 01/17/24 0500 01/17/24 0645  BP: (!) 175/56 (!) 170/75 (!) 171/75 (!) 146/63  Pulse: 64   60  Resp: 19 18 17 16   Temp: 98.6 F (37 C)   98.5 F (36.9 C)  TempSrc:      SpO2: 96%   97%    Intake/Output Summary (Last 24 hours) at 01/17/2024 0816 Last data filed at 01/16/2024 2219 Gross per 24 hour  Intake 600.33 ml  Output --  Net 600.33 ml   There were no vitals filed for this visit.  Examination:   General: Appearance:    Chronically ill male in no acute distress     Lungs:     respirations unlabored  Heart:    Normal heart rate.     MS:   All extremities are intact.    Neurologic:   Awake, alert-- weakness on left Lower ext       Data Reviewed: I have personally reviewed following labs and imaging studies  CBC: Recent Labs  Lab 01/16/24 1659 01/17/24 0522  WBC 8.5 8.1  NEUTROABS 5.9  --  HGB 10.0* 11.9*  HCT 32.1* 36.2*  MCV 92.2 89.2  PLT 209 230   Basic Metabolic Panel: Recent Labs  Lab 01/16/24 1659 01/17/24 0522  NA 138 141  K 4.1 3.6  CL 104 109  CO2 21* 22  GLUCOSE 164* 143*  BUN 13 11  CREATININE 0.74 0.55*  CALCIUM  9.4 8.2*   GFR: CrCl cannot be calculated (Unknown ideal weight.). Liver Function Tests: Recent Labs  Lab 01/16/24 1659  AST 27  ALT 20  ALKPHOS 105  BILITOT 1.2  PROT 5.7*  ALBUMIN 3.5   No results for input(s): LIPASE, AMYLASE in the last 168 hours. No results for input(s): AMMONIA in the  last 168 hours. Coagulation Profile: No results for input(s): INR, PROTIME in the last 168 hours. Cardiac Enzymes: No results for input(s): CKTOTAL, CKMB, CKMBINDEX, TROPONINI in the last 168 hours. BNP (last 3 results) Recent Labs    01/16/24 1659  PROBNP 465.0*   HbA1C: No results for input(s): HGBA1C in the last 72 hours. CBG: No results for input(s): GLUCAP in the last 168 hours. Lipid Profile: No results for input(s): CHOL, HDL, LDLCALC, TRIG, CHOLHDL, LDLDIRECT in the last 72 hours. Thyroid  Function Tests: No results for input(s): TSH, T4TOTAL, FREET4, T3FREE, THYROIDAB in the last 72 hours. Anemia Panel: No results for input(s): VITAMINB12, FOLATE, FERRITIN, TIBC, IRON, RETICCTPCT in the last 72 hours. Sepsis Labs: No results for input(s): PROCALCITON, LATICACIDVEN in the last 168 hours.  No results found for this or any previous visit (from the past 240 hours).       Radiology Studies: CT Head Wo Contrast Result Date: 01/16/2024 CLINICAL DATA:  Neuro deficit, acute, stroke suspected Worsening left-sided weakness EXAM: CT HEAD WITHOUT CONTRAST TECHNIQUE: Contiguous axial images were obtained from the base of the skull through the vertex without intravenous contrast. RADIATION DOSE REDUCTION: This exam was performed according to the departmental dose-optimization program which includes automated exposure control, adjustment of the mA and/or kV according to patient size and/or use of iterative reconstruction technique. COMPARISON:  CT head 12/19/2023, CT head 12/13/2023 FINDINGS: Brain: Patchy and confluent areas of decreased attenuation are noted throughout the deep and periventricular white matter of the cerebral hemispheres bilaterally, compatible with chronic microvascular ischemic disease. Redemonstration of bilateral chronic basal ganglia lacunar infarctions. No evidence of large-territorial acute infarction. No  parenchymal hemorrhage. No mass lesion. No extra-axial collection. No mass effect or midline shift. No hydrocephalus. Basilar cisterns are patent. Vascular: No hyperdense vessel. Atherosclerotic calcifications are present within the cavernous internal carotid and vertebral arteries. Skull: No acute fracture or focal lesion. Sinuses/Orbits: Paranasal sinuses and mastoid air cells are clear. Bilateral lens replacement. Otherwise the orbits are unremarkable. Other: None. IMPRESSION: No acute intracranial abnormality. Electronically Signed   By: Morgane  Naveau M.D.   On: 01/16/2024 18:36   DG Chest Port 1 View Result Date: 01/16/2024 CLINICAL DATA:  Weakness EXAM: PORTABLE CHEST - 1 VIEW COMPARISON:  12/13/2023 FINDINGS: Low lung volumes. No focal airspace consolidation, pleural effusion, or pneumothorax. No cardiomegaly. Left chest pacemaker with leads terminating in the right atrium and right ventricle. Aortic atherosclerosis. No acute fracture or destructive lesions. Multilevel thoracic osteophytosis. IMPRESSION: Low lung volumes.  Otherwise, no acute cardiopulmonary abnormality. Electronically Signed   By: Rogelia Myers M.D.   On: 01/16/2024 16:15        Scheduled Meds:  atorvastatin   80 mg Oral QHS   clopidogrel   75 mg Oral Q breakfast   enoxaparin  (LOVENOX ) injection  40  mg Subcutaneous Q24H   insulin  aspart  0-9 Units Subcutaneous TID WC   losartan   25 mg Oral Daily   pantoprazole   40 mg Oral QHS   Continuous Infusions:  sodium chloride  75 mL/hr at 01/16/24 1947   cefTRIAXone  (ROCEPHIN )  IV       LOS: 0 days    Time spent: 45 minutes spent on chart review, discussion with nursing staff, consultants, updating family and interview/physical exam; more than 50% of that time was spent in counseling and/or coordination of care.    Harlene [name] Bowl, DO [facility] Available via Epic secure chat 7am-7pm After these hours, please refer to coverage provider listed on  [email or web address] 01/17/2024, 8:16 AM

## 2024-01-17 NOTE — Evaluation (Signed)
 Occupational Therapy Evaluation Patient Details Name: John [name] MRN: [id number] DOB: 12-Jan-1946 Today's Date: 01/17/2024   History of Present Illness   Mr. Vivanco is a 78 yr old male brought to the hospital with L sided weakness and an inability to get out of the care after he returned home from [facility] rehab. PMH: neuropathy, HTN, HLD, DM II, CVA, cognitive deficits, CAD, heart block s/p PPM, chronic heart failure, DVT, BPH, urinary retention, breast CA s/p R mastectomy     Clinical Impressions The pt is currently presenting with the below listed deficits (see OT problem list). As such, his occupational performance is compromised and he requires increased assistance for self-care management. He is also presenting well below his reported baseline level of functioning for self-care management, with the pt reporting being modified independent to independent with ADL at his baseline. During the session, the pt was also noted to be with LUE and LLE ROM limitations and weakness, with weakness being more pronounced on his LLE. He further reported decreased sensation to light touch in his LUE and LLE, with deficits being more pronounced in his LLE. He stated L sided deficits are acute in nature. He required max assist to perform supine to sit and total assist for sock management. He presented with fair static sitting balance and poor dynamic sitting balance EOB. He will benefit from further OT services to maximize his occupational performance and to decrease the risk for restricted participation in meaningful activities. Patient will benefit from intensive inpatient follow-up therapy, >3 hours/day.      If plan is discharge home, recommend the following:   A lot of help with bathing/dressing/bathroom;A lot of help with walking and/or transfers;Assistance with cooking/housework;Assist for transportation;Help with stairs or ramp for entrance;Supervision due to cognitive status;Direct supervision/assist  for medications management;Direct supervision/assist for financial management     Functional Status Assessment   Patient has had a recent decline in their functional status and demonstrates the ability to make significant improvements in function in a reasonable and predictable amount of time.     Equipment Recommendations   None recommended by OT     Recommendations for Other Services         Precautions/Restrictions   Precautions Precautions: Fall Restrictions Weight Bearing Restrictions Per Provider Order: No Other Position/Activity Restrictions: L sided weakness     Mobility Bed Mobility Overal bed mobility: Needs Assistance Bed Mobility: Supine to Sit, Sit to Supine     Supine to sit: Max assist, HOB elevated, Used rails Sit to supine: Mod assist (required assist for BLE back onto bed)           Balance     Sitting balance-Leahy Scale:  (static sitting-fair. dynamic sitting-poor)          ADL either performed or assessed with clinical judgement   ADL Overall ADL's : Needs assistance/impaired Eating/Feeding: Minimal assistance;Sitting Eating/Feeding Details (indicate cue type and reason): In supported sitting. Assist anticipated to be needed for cutting food, removing lids and opening containers/packets, given LUE weakness and impaired coordination. Grooming: Moderate assistance;Sitting;Cueing for compensatory techniques Grooming Details (indicate cue type and reason): In supported sitting. Pt limited by LUE weakness and coordination deficits. Upper Body Bathing: Sitting;Cueing for compensatory techniques;Minimal assistance;Moderate assistance Upper Body Bathing Details (indicate cue type and reason): In supported sitting. Lower Body Bathing: Maximal assistance;Sitting/lateral leans;Cueing for compensatory techniques Lower Body Bathing Details (indicate cue type and reason): In supported sitting. Upper Body Dressing : Moderate assistance;Sitting    Lower  Body Dressing: Cueing for compensatory techniques;Cueing for sequencing;Maximal assistance Lower Body Dressing Details (indicate cue type and reason): In supported sitting. Toilet Transfer: Maximal assistance;Cueing for sequencing;Cueing for safety;BSC/3in1;+2 for physical assistance   Toileting- Clothing Manipulation and Hygiene: Total assistance Toileting - Clothing Manipulation Details (indicate cue type and reason): at bedside commode level, based on clinical judgement             Vision   Additional Comments: He denied having acute vision changes or deficits. He correctly read the time depicted on the wall clock.            Pertinent Vitals/Pain Pain Assessment Pain Assessment: No/denies pain     Extremity/Trunk Assessment Upper Extremity Assessment Upper Extremity Assessment: Right hand dominant;RUE deficits/detail;LUE deficits/detail RUE Deficits / Details: AROM WFL. Gross strength 4+/5. Digit opposition WFL LUE Deficits / Details: Required AAROM for shoulder and elbow. Pt reported acute weakness and decreased sensation to light touch. Digit opposition moderately impaired. Grip strength 3+/5, wrist flexion 3/5, wrist extension 2+/5, elbow flexion, 3-/5, elbow extension 3/5, shoulder flexion 2/5 LUE Sensation: decreased light touch LUE Coordination: decreased fine motor;decreased gross motor   Lower Extremity Assessment Lower Extremity Assessment: RLE deficits/detail;LLE deficits/detail RLE Deficits / Details:  (AROM and strength WFL) LLE Deficits / Details:  (Required AAROM for knee and PROM for hip. Generalized weakness. Decreased sensation to light touch.) LLE Sensation: decreased light touch LLE Coordination: decreased gross motor       Communication Communication Factors Affecting Communication: Hearing impaired   Cognition Arousal: Alert Behavior During Therapy: WFL for tasks assessed/performed, Flat affect Cognition: History of cognitive impairments,  No family/caregiver present to determine baseline   Orientation impairments: Time   Memory impairment (select all impairments): Declarative long-term memory, Short-term memory Attention impairment (select first level of impairment): Sustained attention, Divided attention Executive functioning impairment (select all impairments): Organization OT - Cognition Comments: Oriented to person and city, able to recall the month though required three options and increased time to do so, oriented to year, had difficulty stating he was in the hospital. Appeared to have occasional difficulty with memory/recall, as well as sustained attention.        Following commands impaired: Follows one step commands inconsistently     Cueing  General Comments   Cueing Techniques: Verbal cues;Tactile cues;Gestural cues              Home Living   Living Arrangements: Spouse/significant other Available Help at Discharge: Family Type of Home: House Home Access: Stairs to enter [employer] of Steps: 3 Entrance Stairs-Rails: None Home Layout: Two level Alternate Level Stairs-Number of Steps: He reported sleeping in a recliner on the main level of the home. All bedrooms are upstairs   Bathroom Shower/Tub: Walk-in shower         Home Equipment: Agricultural consultant (2 wheels);Cane - single point;BSC/3in1;Wheelchair - power   Additional Comments: The pt was a questionable historian at times, therefore information reported may need to be verified.      Prior Functioning/Environment Prior Level of Function : Independent/Modified Independent             Mobility Comments:  (He reported using a RW for ambulation.) ADLs Comments:  (The pt reported being modified independent to independent with ADLs and sharing household chores with his spouse. He has not driven since January.)    OT Problem List: Decreased strength;Decreased activity tolerance;Impaired balance (sitting and/or  standing);Decreased cognition;Decreased range of motion;Decreased coordination;Decreased knowledge of use of DME or  AE;Impaired sensation;Impaired tone;Impaired UE functional use   OT Treatment/Interventions: Self-care/ADL training;Therapeutic activities;DME and/or AE instruction;Balance training;Therapeutic exercise;Neuromuscular education;Energy conservation;Patient/family education;Cognitive remediation/compensation      OT Goals(Current goals can be found in the care plan section)   Acute Rehab OT Goals Patient Stated Goal: to be able to walk into my house and up and down my stairs OT Goal Formulation: With patient Time For Goal Achievement: 01/31/24 Potential to Achieve Goals: Fair ADL Goals Pt Will Perform Eating: with supervision;with set-up;sitting Pt Will Perform Grooming: with set-up;with supervision;sitting;with adaptive equipment Pt Will Perform Upper Body Dressing: with min assist;sitting Pt Will Transfer to Toilet: with min assist;stand pivot transfer;bedside commode Additional ADL Goal #1: The pt will perform bed mobility with CGA, in prep for progressive ADL participation.   OT Frequency:  Min 2X/week       AM-PAC OT 6 Clicks Daily Activity     Outcome Measure Help from another person eating meals?: A Little Help from another person taking care of personal grooming?: A Lot Help from another person toileting, which includes using toliet, bedpan, or urinal?: Total Help from another person bathing (including washing, rinsing, drying)?: A Lot Help from another person to put on and taking off regular upper body clothing?: A Lot Help from another person to put on and taking off regular lower body clothing?: A Lot 6 Click Score: 12   End of Session Equipment Utilized During Treatment: Other (comment) (N/A) Nurse Communication: Mobility status  Activity Tolerance: Patient tolerated treatment well Patient left: in bed;with call bell/phone within reach  OT Visit  Diagnosis: Unsteadiness on feet (R26.81);Other abnormalities of gait and mobility (R26.89);Muscle weakness (generalized) (M62.81);Feeding difficulties (R63.3);Other symptoms and signs involving cognitive function                Time: 8884-8856 OT Time Calculation (min): 28 min Charges:  OT General Charges $OT Visit: 1 Visit OT Evaluation $OT Eval Moderate Complexity: 1 Mod OT Treatments $Therapeutic Activity: 8-22 mins    Delanna JINNY Lesches, OTR/L 01/17/2024, 1:02 PM

## 2024-01-17 NOTE — ED Notes (Signed)
 Patient given lunch tray.

## 2024-01-18 ENCOUNTER — Observation Stay (HOSPITAL_COMMUNITY)

## 2024-01-18 ENCOUNTER — Ambulatory Visit: Admitting: Family Medicine

## 2024-01-18 ENCOUNTER — Telehealth: Payer: Self-pay

## 2024-01-18 DIAGNOSIS — R29818 Other symptoms and signs involving the nervous system: Secondary | ICD-10-CM | POA: Diagnosis not present

## 2024-01-18 DIAGNOSIS — G959 Disease of spinal cord, unspecified: Secondary | ICD-10-CM | POA: Diagnosis not present

## 2024-01-18 DIAGNOSIS — R531 Weakness: Secondary | ICD-10-CM | POA: Diagnosis not present

## 2024-01-18 LAB — CBC
HCT: 38.1 % — ABNORMAL LOW (ref 39.0–52.0)
Hemoglobin: 12.4 g/dL — ABNORMAL LOW (ref 13.0–17.0)
MCH: 28.8 pg (ref 26.0–34.0)
MCHC: 32.5 g/dL (ref 30.0–36.0)
MCV: 88.4 fL (ref 80.0–100.0)
Platelets: 250 K/uL (ref 150–400)
RBC: 4.31 MIL/uL (ref 4.22–5.81)
RDW: 17.9 % — ABNORMAL HIGH (ref 11.5–15.5)
WBC: 7.8 K/uL (ref 4.0–10.5)
nRBC: 0 % (ref 0.0–0.2)

## 2024-01-18 LAB — GLUCOSE, CAPILLARY
Glucose-Capillary: 315 mg/dL — ABNORMAL HIGH (ref 70–99)
Glucose-Capillary: 195 mg/dL — ABNORMAL HIGH (ref 70–99)

## 2024-01-18 LAB — BASIC METABOLIC PANEL WITH GFR
Anion gap: 11 (ref 5–15)
BUN: 10 mg/dL (ref 8–23)
CO2: 24 mmol/L (ref 22–32)
Calcium: 8.9 mg/dL (ref 8.9–10.3)
Chloride: 106 mmol/L (ref 98–111)
Creatinine, Ser: 0.63 mg/dL (ref 0.61–1.24)
GFR, Estimated: >60 mL/min (ref >60)
Glucose, Bld: 185 mg/dL — ABNORMAL HIGH (ref 70–99)
Potassium: 3.8 mmol/L (ref 3.5–5.1)
Sodium: 140 mmol/L (ref 135–145)

## 2024-01-18 LAB — CBG MONITORING, ED
Glucose-Capillary: 188 mg/dL — ABNORMAL HIGH (ref 70–99)
Glucose-Capillary: 176 mg/dL — ABNORMAL HIGH (ref 70–99)
Glucose-Capillary: 219 mg/dL — ABNORMAL HIGH (ref 70–99)

## 2024-01-18 MED ORDER — GADOBUTROL 1 MMOL/ML IV SOLN
8.0000 mL | Freq: Once | INTRAVENOUS | Status: AC | PRN
  Administered 2024-01-18: 8 mL via INTRAVENOUS

## 2024-01-18 NOTE — Progress Notes (Addendum)
 PROGRESS NOTE    Del Overfelt  FMW:[id number] DOB: [birth year] DOA: 01/16/2024 PCP: [name], DO   Brief Narrative:  John [name] is a 78 y.o. male with medical history significant for hypertension, hyperlipidemia, type 2 diabetes mellitus, history of CVA, cognitive deficits, CAD, heart block s/p PPM chronic HFrEF, provoked DVT no longer anticoagulated, BPH, urinary retention with Foley catheter, and recent admissions with UTI, bacteremia, pneumonia, nausea and vomiting and discharged to SNF now presenting with left-sided weakness.   Assessment & Plan:   Principal Problem:   Left-sided weakness Active Problems:   Status post placement of cardiac pacemaker   Acute cystitis without hematuria   History of CVA (cerebrovascular accident)   Weakness  Left-sided weakness - Pt with history of multiple lacunar infarcts on Plavix  now presented with left-sided weakness of unclear last well-known.Presented with noticeable weakness on the LUE and LLE but normal sensation and per patient, his weakness is improving now. - CT head negative for acute intracranial abnormality - Neurology consulted by EDP, recommends MRI brain and MRI C-spine and reconsult neurology if positive.  Due to history of pacemaker, patient has to go to [facility] together MRI done.  Still waiting for the bed.  Discontinue losartan  to allow permissive hypertension.  Addendum: MRI brain positive for pontine stroke.  Consulted neurology/Dr. Matthews.  They will see patient.  Antiplatelet per neurology.  Last lipid panel from January 2025.  I have ordered lipid panel for tomorrow morning.  He is already on high intensity atorvastatin  80 mg.   Acute cystitis Continue Rocephin .  Follow culture which is still in process.   T2DM - Last A1c 7.5% in April 2025 - SSI with meals, CBG monitoring    HTN/HFrEF - Last TTE on 11/2023 showed EF 30-35%, LV global hypokinesis and mild protruding plaque involving the aortic  root - Patient euvolemic on exam - Discontinue losartan .   Hx of CVA - History of multiple lacunar infarcts - Continue Plavix  and atorvastatin    History of urinary retention/ BPH - Continue Foley catheter-- will need outpatient follow up   Heart block s/p PPM - has pacemaker    CAD - Continue Plavix  and atorvastatin     HLD -atorvastatin    GERD - Continue Protonix   DVT prophylaxis: enoxaparin  (LOVENOX ) injection 40 mg Start: 01/17/24 1000   Code Status: Limited: Do not attempt resuscitation (DNR) -DNR-LIMITED -Do Not Intubate/DNI   Family Communication:  None present at bedside.  Plan of care discussed with patient in length and he/she verbalized understanding and agreed with it.  Status is: Observation The patient will require care spanning > 2 midnights and should be moved to inpatient because: Needs MRI brain.   Estimated body mass index is 24.49 kg/m as calculated from the following:   Height as of 12/27/23: 6' (1.829 m).   Weight as of 01/01/24: 81.9 kg.    Nutritional Assessment: There is no height or weight on file to calculate BMI.. Seen by dietician.  I agree with the assessment and plan as outlined below: Nutrition Status:        . Skin Assessment: I have examined the patient's skin and I agree with the wound assessment as performed by the wound care RN as outlined below:    Consultants:  Neurology curb sided-Will need to be consulted officially if MRI positive.  Procedures:  None  Antimicrobials:  Anti-infectives (From admission, onward)    Start     Dose/Rate Route Frequency Ordered Stop  01/17/24 2100  cefTRIAXone  (ROCEPHIN ) 1 g in sodium chloride  0.9 % 100 mL IVPB        1 g 200 mL/hr over 30 Minutes Intravenous Every 24 hours 01/16/24 2355     01/16/24 2045  cefTRIAXone  (ROCEPHIN ) 1 g in sodium chloride  0.9 % 100 mL IVPB        1 g 200 mL/hr over 30 Minutes Intravenous  Once 01/16/24 2041 01/16/24 2219         Subjective: Patient  seen and examined, nurse at the bedside.  Patient states that he still has Weakness, much improved compared to yesterday.  Physical strength is 4/5 in left upper and lower extremity.  Objective: Vitals:   01/18/24 0300 01/18/24 0400 01/18/24 0640 01/18/24 0817  BP: (!) 170/60 (!) 155/83  (!) 172/80  Pulse: 60 (!) 59  66  Resp: 18 18  17   Temp:   98 F (36.7 C) 98.4 F (36.9 C)  TempSrc:    Oral  SpO2: 95% 99%  97%    Intake/Output Summary (Last 24 hours) at 01/18/2024 0825 Last data filed at 01/18/2024 0426 Gross per 24 hour  Intake 100 ml  Output 1300 ml  Net -1200 ml   There were no vitals filed for this visit.  Examination:  General exam: Appears calm and comfortable  Respiratory system: Clear to auscultation. Respiratory effort normal. Cardiovascular system: S1 & S2 heard, RRR. No JVD, murmurs, rubs, gallops or clicks. No pedal edema. Gastrointestinal system: Abdomen is nondistended, soft and nontender. No organomegaly or masses felt. Normal bowel sounds heard. Central nervous system: Alert and oriented.  4/5 power in left upper and lower extremity. Skin: No rashes, lesions or ulcers  Data Reviewed: I have personally reviewed following labs and imaging studies  CBC: Recent Labs  Lab 01/16/24 1659 01/17/24 0522 01/18/24 0417  WBC 8.5 8.1 7.8  NEUTROABS 5.9  --   --   HGB 10.0* 11.9* 12.4*  HCT 32.1* 36.2* 38.1*  MCV 92.2 89.2 88.4  PLT 209 230 250   Basic Metabolic Panel: Recent Labs  Lab 01/16/24 1659 01/17/24 0522 01/18/24 0417  NA 138 141 140  K 4.1 3.6 3.8  CL 104 109 106  CO2 21* 22 24  GLUCOSE 164* 143* 185*  BUN 13 11 10   CREATININE 0.74 0.55* 0.63  CALCIUM  9.4 8.2* 8.9   GFR: CrCl cannot be calculated (Unknown ideal weight.). Liver Function Tests: Recent Labs  Lab 01/16/24 1659  AST 27  ALT 20  ALKPHOS 105  BILITOT 1.2  PROT 5.7*  ALBUMIN 3.5   No results for input(s): LIPASE, AMYLASE in the last 168 hours. No results for  input(s): AMMONIA in the last 168 hours. Coagulation Profile: No results for input(s): INR, PROTIME in the last 168 hours. Cardiac Enzymes: No results for input(s): CKTOTAL, CKMB, CKMBINDEX, TROPONINI in the last 168 hours. BNP (last 3 results) Recent Labs    01/16/24 1659  PROBNP 465.0*   HbA1C: Recent Labs    01/17/24 0522  HGBA1C 8.5*   CBG: Recent Labs  Lab 01/17/24 0849 01/17/24 1202 01/17/24 1634 01/18/24 0357 01/18/24 0813  GLUCAP 176* 168* 179* 188* 176*   Lipid Profile: No results for input(s): CHOL, HDL, LDLCALC, TRIG, CHOLHDL, LDLDIRECT in the last 72 hours. Thyroid  Function Tests: No results for input(s): TSH, T4TOTAL, FREET4, T3FREE, THYROIDAB in the last 72 hours. Anemia Panel: No results for input(s): VITAMINB12, FOLATE, FERRITIN, TIBC, IRON, RETICCTPCT in the last 72 hours. Sepsis Labs:  No results for input(s): PROCALCITON, LATICACIDVEN in the last 168 hours.  No results found for this or any previous visit (from the past 240 hours).   Radiology Studies: CT Head Wo Contrast Result Date: 01/16/2024 CLINICAL DATA:  Neuro deficit, acute, stroke suspected Worsening left-sided weakness EXAM: CT HEAD WITHOUT CONTRAST TECHNIQUE: Contiguous axial images were obtained from the base of the skull through the vertex without intravenous contrast. RADIATION DOSE REDUCTION: This exam was performed according to the departmental dose-optimization program which includes automated exposure control, adjustment of the mA and/or kV according to patient size and/or use of iterative reconstruction technique. COMPARISON:  CT head 12/19/2023, CT head 12/13/2023 FINDINGS: Brain: Patchy and confluent areas of decreased attenuation are noted throughout the deep and periventricular white matter of the cerebral hemispheres bilaterally, compatible with chronic microvascular ischemic disease. Redemonstration of bilateral chronic basal ganglia  lacunar infarctions. No evidence of large-territorial acute infarction. No parenchymal hemorrhage. No mass lesion. No extra-axial collection. No mass effect or midline shift. No hydrocephalus. Basilar cisterns are patent. Vascular: No hyperdense vessel. Atherosclerotic calcifications are present within the cavernous internal carotid and vertebral arteries. Skull: No acute fracture or focal lesion. Sinuses/Orbits: Paranasal sinuses and mastoid air cells are clear. Bilateral lens replacement. Otherwise the orbits are unremarkable. Other: None. IMPRESSION: No acute intracranial abnormality. Electronically Signed   By: Morgane  Naveau M.D.   On: 01/16/2024 18:36   DG Chest Port 1 View Result Date: 01/16/2024 CLINICAL DATA:  Weakness EXAM: PORTABLE CHEST - 1 VIEW COMPARISON:  12/13/2023 FINDINGS: Low lung volumes. No focal airspace consolidation, pleural effusion, or pneumothorax. No cardiomegaly. Left chest pacemaker with leads terminating in the right atrium and right ventricle. Aortic atherosclerosis. No acute fracture or destructive lesions. Multilevel thoracic osteophytosis. IMPRESSION: Low lung volumes.  Otherwise, no acute cardiopulmonary abnormality. Electronically Signed   By: Rogelia Myers M.D.   On: 01/16/2024 16:15    Scheduled Meds:  atorvastatin   80 mg Oral QHS   clopidogrel   75 mg Oral Q breakfast   enoxaparin  (LOVENOX ) injection  40 mg Subcutaneous Q24H   insulin  aspart  0-9 Units Subcutaneous TID WC   pantoprazole   40 mg Oral QHS   Continuous Infusions:  sodium chloride  75 mL/hr at 01/18/24 9272   cefTRIAXone  (ROCEPHIN )  IV Stopped (01/17/24 2133)     LOS: 0 days   Fredia Skeeter, MD [facility]  01/18/2024, 8:25 AM   *Please note that this is a verbal dictation therefore any spelling or grammatical errors are due to the Dragon Medical One system interpretation.  Please page via Amion and do not message via secure chat for urgent patient care matters. Secure chat can be  used for non urgent patient care matters.  How to contact the [facility] Attending or Consulting provider 7A - 7P or covering provider during after hours 7P -7A, for this patient?  Check the care team in [facility] and look for a) attending/consulting [facility] provider listed and b) the [facility] team listed. Page or secure chat 7A-7P. Log into [email or web address] and use [place]'s universal password to access. If you do not have the password, please contact the hospital operator. Locate the [facility] provider you are looking for under [facility] and page to a number that you can be directly reached. If you still have difficulty reaching the provider, please page the [facility] (Director on Call) for the Hospitalists listed on amion for assistance.

## 2024-01-18 NOTE — Evaluation (Signed)
 Physical Therapy Evaluation Patient Details Name: John [name] MRN: [id number] DOB: July 01, 1945 Today's Date: 01/18/2024  History of Present Illness  John [name] is a 78 yr old male brought to the hospital with L sided weakness and an inability to get out of the care after he returned home from [facility] rehab.  MR/ MRA pending. PMH: T12 deformity,neuropathy, HTN, HLD, DM II, CVA, cognitive deficits, CAD, heart block s/p PPM, chronic heart failure, DVT, BPH, urinary retention, breast CA s/p R mastectomy  Clinical Impression    Pt admitted with above diagnosis.  Pt currently with functional limitations due to the deficits listed below (see PT Problem List). Pt will benefit from acute skilled PT to increase their independence and safety with mobility to allow discharge.   The patient presents with LLE weakness, dyscontrol, impaired sitting and standing balance.  Patient assisted  to stand  at [facility] with mod support of 2.  Patient will benefit from intensive inpatient follow-up therapy, >3 hours/day  Patient was just DC'd from SNF, unsure of functional mobility at DC, patient unable to state, no family   available.           If plan is discharge home, recommend the following: A lot of help with walking and/or transfers;A lot of help with bathing/dressing/bathroom;Assistance with cooking/housework;Help with stairs or ramp for entrance   Can travel by private vehicle        Equipment Recommendations None recommended by PT  Recommendations for Other Services       Functional Status Assessment Patient has had a recent decline in their functional status and demonstrates the ability to make significant improvements in function in a reasonable and predictable amount of time.     Precautions / Restrictions Precautions Precautions: Fall Precaution/Restrictions Comments: left extremities weak Restrictions Other Position/Activity Restrictions: L sided weakness      Mobility  Bed  Mobility   Bed Mobility: Supine to Sit, Sit to Supine, Rolling Rolling: Max assist Sidelying to sit: Max assist, +2 for physical assistance, +2 for safety/equipment   Sit to supine: Max assist, +2 for physical assistance, +2 for safety/equipment   General bed mobility comments: patient initiated moving legs to left bed edge, max support to sit upright, nmax assist to return to supine, assisting legs and  trnk.    Transfers Overall transfer level: Needs assistance   Transfers: Sit to/from Stand Sit to Stand: Mod assist, +2 physical assistance, +2 safety/equipment, From elevated surface, Via lift equipment           General transfer comment: assist to reach to bar of STEDY, Assist to stand at stedy,flaps down and repositioned on stretcher and   stood again in stedy. Transfer via Lift Equipment: Stedy  Ambulation/Gait                  Stairs            Wheelchair Mobility     Tilt Bed    Modified Rankin (Stroke Patients Only)       Balance Overall balance assessment: Needs assistance Sitting-balance support: Feet supported, Bilateral upper extremity supported Sitting balance-Leahy Scale: Poor Sitting balance - Comments: left  lateral lean Postural control: Left lateral lean Standing balance support: Reliant on assistive device for balance, Bilateral upper extremity supported   Standing balance comment: Stood in STEDY with mod assist for balance  Pertinent Vitals/Pain Pain Assessment Faces Pain Scale: Hurts little more Pain Location: back with transfers Pain Descriptors / Indicators: Discomfort Pain Intervention(s): Monitored during session, Repositioned    Home Living Family/patient expects to be discharged to:: Private residence Living Arrangements: Spouse/significant other Available Help at Discharge: Family Type of Home: House Home Access: Stairs to enter Entrance Stairs-Rails: None Entrance  Stairs-Number of Steps: 3 Alternate Level Stairs-Number of Steps: He reported sleeping in a recliner on the main level of the home. All bedrooms are upstairs Home Layout: Two level Home Equipment: Agricultural consultant (2 wheels);Cane - single point;BSC/3in1;Wheelchair - power Additional Comments: pt was DC from SNF on day of adm 10/6    Prior Function               Mobility Comments: was ambulating with RW x 20' prior to DC to [facility] 9/16 after T12 compr fx, unsure distance while in SNF ADLs Comments: unsure     Extremity/Trunk Assessment   Upper Extremity Assessment Upper Extremity Assessment: Defer to OT evaluation    Lower Extremity Assessment Lower Extremity Assessment: LLE deficits/detail RLE Deficits / Details: WFL LLE Deficits / Details: slow movements, decreased control LLE Sensation: decreased light touch LLE Coordination: decreased gross motor    Cervical / Trunk Assessment Cervical / Trunk Assessment: Other exceptions  Communication   Communication Communication: Impaired Factors Affecting Communication: Hearing impaired    Cognition Arousal: Alert Behavior During Therapy: WFL for tasks assessed/performed, Flat affect   PT - Cognitive impairments: History of cognitive impairments, Problem solving, Awareness, Safety/Judgement                       PT - Cognition Comments: Pt oriented x 3, ; flat affect and slow to respond/no response at times Following commands: Impaired Following commands impaired: Follows one step commands inconsistently     Cueing Cueing Techniques: Verbal cues, Gestural cues     General Comments      Exercises     Assessment/Plan    PT Assessment Patient needs continued PT services  PT Problem List Decreased strength;Decreased cognition;Decreased knowledge of use of DME;Decreased activity tolerance;Decreased balance;Decreased mobility;Decreased knowledge of precautions;Pain;Decreased coordination;Impaired sensation        PT Treatment Interventions DME instruction;Gait training;Functional mobility training;Therapeutic activities;Therapeutic exercise;Balance training;Neuromuscular re-education;Cognitive remediation;Patient/family education;Wheelchair mobility training    PT Goals (Current goals can be found in the Care Plan section)  Acute Rehab PT Goals PT Goal Formulation: Patient unable to participate in goal setting Time For Goal Achievement: 02/01/24 Potential to Achieve Goals: Fair    Frequency Min 3X/week     Co-evaluation               AM-PAC PT 6 Clicks Mobility  Outcome Measure Help needed turning from your back to your side while in a flat bed without using bedrails?: Total Help needed moving from lying on your back to sitting on the side of a flat bed without using bedrails?: Total Help needed moving to and from a bed to a chair (including a wheelchair)?: Total Help needed standing up from a chair using your arms (e.g., wheelchair or bedside chair)?: Total Help needed to walk in hospital room?: Total Help needed climbing 3-5 steps with a railing? : Total 6 Click Score: 6    End of Session Equipment Utilized During Treatment: Gait belt Activity Tolerance: Patient tolerated treatment well Patient left: in bed;with bed alarm set;with call bell/phone within reach Nurse Communication: Mobility status;Other (comment);Need for lift  equipment PT Visit Diagnosis: Unsteadiness on feet (R26.81);Difficulty in walking, not elsewhere classified (R26.2);Other symptoms and signs involving the nervous system (R29.898);Muscle weakness (generalized) (M62.81);Hemiplegia and hemiparesis Hemiplegia - Right/Left: Left Hemiplegia - dominant/non-dominant: Non-dominant Hemiplegia - caused by: Cerebral infarction    Time: 0842-0902 PT Time Calculation (min) (ACUTE ONLY): 20 min   Charges:   PT Evaluation $PT Eval Low Complexity: 1 Low   PT General Charges $$ ACUTE PT VISIT: 1 Visit          Darice Potters PT Acute Rehabilitation Services Office [phone]   Potters Darice Norris 01/18/2024, 1:02 PM

## 2024-01-18 NOTE — ED Notes (Addendum)
 [facility] has been notified, MRI scheduled for 1030

## 2024-01-18 NOTE — Progress Notes (Signed)
  Device system confirmed to be MRI conditional, with implant date > 6 weeks ago, and no evidence of abandoned or epicardial leads in review of most recent CXR  Device last cleared by EP Provider: Prentice Passey 01/17/24  Clearance is good through for 1 year as long as parameters remain stable at time of check. If pt undergoes a cardiac device procedure during that time, they should be re-cleared.   Tachy-therapies to be programmed off if applicable with device back to pre-MRI settings after completion of exam.  [name] will be present for programming for the MRI.   Tobias LITTIE Leander, RT  01/18/2024 12:35 PM

## 2024-01-19 ENCOUNTER — Observation Stay (HOSPITAL_COMMUNITY)

## 2024-01-19 DIAGNOSIS — Z950 Presence of cardiac pacemaker: Secondary | ICD-10-CM | POA: Diagnosis not present

## 2024-01-19 DIAGNOSIS — I6329 Cerebral infarction due to unspecified occlusion or stenosis of other precerebral arteries: Secondary | ICD-10-CM

## 2024-01-19 DIAGNOSIS — E785 Hyperlipidemia, unspecified: Secondary | ICD-10-CM

## 2024-01-19 DIAGNOSIS — R531 Weakness: Secondary | ICD-10-CM | POA: Diagnosis not present

## 2024-01-19 DIAGNOSIS — I69354 Hemiplegia and hemiparesis following cerebral infarction affecting left non-dominant side: Secondary | ICD-10-CM

## 2024-01-19 DIAGNOSIS — R9082 White matter disease, unspecified: Secondary | ICD-10-CM

## 2024-01-19 DIAGNOSIS — Z8673 Personal history of transient ischemic attack (TIA), and cerebral infarction without residual deficits: Secondary | ICD-10-CM | POA: Diagnosis not present

## 2024-01-19 DIAGNOSIS — N3000 Acute cystitis without hematuria: Secondary | ICD-10-CM | POA: Diagnosis not present

## 2024-01-19 DIAGNOSIS — R29703 NIHSS score 3: Secondary | ICD-10-CM

## 2024-01-19 DIAGNOSIS — E1151 Type 2 diabetes mellitus with diabetic peripheral angiopathy without gangrene: Secondary | ICD-10-CM | POA: Diagnosis not present

## 2024-01-19 DIAGNOSIS — I6621 Occlusion and stenosis of right posterior cerebral artery: Secondary | ICD-10-CM | POA: Diagnosis not present

## 2024-01-19 DIAGNOSIS — I10 Essential (primary) hypertension: Secondary | ICD-10-CM | POA: Diagnosis not present

## 2024-01-19 LAB — LIPID PANEL
Cholesterol: 81 mg/dL (ref 0–200)
HDL: 32 mg/dL — ABNORMAL LOW (ref >40)
LDL Cholesterol: 34 mg/dL (ref 0–99)
Total CHOL/HDL Ratio: 2.5 ratio
Triglycerides: 77 mg/dL (ref <150)
VLDL: 15 mg/dL (ref 0–40)

## 2024-01-19 LAB — GLUCOSE, CAPILLARY
Glucose-Capillary: 195 mg/dL — ABNORMAL HIGH (ref 70–99)
Glucose-Capillary: 180 mg/dL — ABNORMAL HIGH (ref 70–99)
Glucose-Capillary: 235 mg/dL — ABNORMAL HIGH (ref 70–99)
Glucose-Capillary: 250 mg/dL — ABNORMAL HIGH (ref 70–99)

## 2024-01-19 LAB — URINE CULTURE: Culture: >=100000 — AB

## 2024-01-19 MED ORDER — IOHEXOL 350 MG/ML SOLN
75.0000 mL | Freq: Once | INTRAVENOUS | Status: AC | PRN
Start: 2024-01-19 — End: 2024-01-19
  Administered 2024-01-19: 75 mL via INTRAVENOUS

## 2024-01-19 MED ORDER — GERHARDT'S BUTT CREAM
TOPICAL_CREAM | Freq: Two times a day (BID) | CUTANEOUS | Status: DC
  Filled 2024-01-19: qty 60

## 2024-01-19 MED ORDER — SODIUM CHLORIDE 0.9 % IV SOLN
1.0000 g | Freq: Three times a day (TID) | INTRAVENOUS | Status: DC
  Administered 2024-01-19 – 2024-01-20 (×2): 1 g via INTRAVENOUS
  Filled 2024-01-19 (×3): qty 20

## 2024-01-19 MED ORDER — ASPIRIN 81 MG PO TBEC
81.0000 mg | DELAYED_RELEASE_TABLET | Freq: Every day | ORAL | Status: DC
  Administered 2024-01-19 – 2024-01-24 (×6): 81 mg via ORAL
  Filled 2024-01-19 (×6): qty 1

## 2024-01-19 NOTE — Progress Notes (Signed)
 [facility]  PROGRESS NOTE  John [name] FMW:[id number] DOB: 1945-07-16 DOA: 01/16/2024 PCP: [name], DO   Brief HPI:    78 y.o. male with medical history significant for hypertension, hyperlipidemia, type 2 diabetes mellitus, history of CVA, cognitive deficits, CAD, heart block s/p PPM chronic HFrEF, provoked DVT no longer anticoagulated, BPH, urinary retention with Foley catheter, and recent admissions with UTI, bacteremia, pneumonia, nausea and vomiting and discharged to SNF now presenting with left-sided weakness.     Assessment/Plan:   Acute pontine infarct - MRI brain showed acute right pontine infarct -Neurology consulted; likely lacunar infarct -CT head and neck showed severe right posterior cerebral artery stenosis - Recently had TEE on 12/08/2023 which showed EF of 30 to 35% with LV global hypokinesis -A1c 8.5 -Neurology recommends to continue aspirin  and Plavix  for 3 weeks and then aspirin  alone -PT evaluation obtained, plan to go to CIR - Atorvastatin  80 mg daily   Acute cystitis Urine culture grew ESBL E. Coli -Will discontinue cefepime  start meropenem   T2DM - Last A1c 7.5% in April 2025 - SSI with meals, CBG monitoring    HTN/HFrEF - Last TTE on 11/2023 showed EF 30-35%, LV global hypokinesis and mild protruding plaque involving the aortic root - Patient euvolemic on exam - Discontinued losartan  for permissive hypertension.   Hx of CVA - History of multiple lacunar infarcts    History of urinary retention/ BPH - Continue Foley catheter-- will need outpatient follow up   Heart block s/p PPM - has pacemaker    CAD - Continue atorvastatin  -It seems patient was not taking Plavix  before he came to the hospital    HLD -atorvastatin    GERD - Continue Protonix     Medications     aspirin  EC  81 mg Oral Daily   atorvastatin   80 mg Oral QHS   clopidogrel   75 mg Oral Q breakfast   enoxaparin  (LOVENOX ) injection  40 mg  Subcutaneous Q24H   insulin  aspart  0-9 Units Subcutaneous TID WC   pantoprazole   40 mg Oral QHS     Data Reviewed:   CBG:  Recent Labs  Lab 01/18/24 0813 01/18/24 1348 01/18/24 1738 01/18/24 2207 01/19/24 0615  GLUCAP 176* 219* 315* 195* 195*    SpO2: 100 %    Vitals:   01/18/24 2156 01/19/24 0018 01/19/24 0404 01/19/24 0759  BP: 133/61 (!) 143/65 133/73 (!) 168/62  Pulse: 65 66 62 61  Resp: 18 19 19 19   Temp: 98.4 F (36.9 C) 98.4 F (36.9 C) 98.8 F (37.1 C) 97.7 F (36.5 C)  TempSrc: Oral Oral  Oral  SpO2: 98% 97% 100% 100%      Data Reviewed:  Basic Metabolic Panel: Recent Labs  Lab 01/16/24 1659 01/17/24 0522 01/18/24 0417  NA 138 141 140  K 4.1 3.6 3.8  CL 104 109 106  CO2 21* 22 24  GLUCOSE 164* 143* 185*  BUN 13 11 10   CREATININE 0.74 0.55* 0.63  CALCIUM  9.4 8.2* 8.9    CBC: Recent Labs  Lab 01/16/24 1659 01/17/24 0522 01/18/24 0417  WBC 8.5 8.1 7.8  NEUTROABS 5.9  --   --   HGB 10.0* 11.9* 12.4*  HCT 32.1* 36.2* 38.1*  MCV 92.2 89.2 88.4  PLT 209 230 250    LFT Recent Labs  Lab 01/16/24 1659  AST 27  ALT 20  ALKPHOS 105  BILITOT 1.2  PROT 5.7*  ALBUMIN 3.5     Antibiotics: Anti-infectives (  From admission, onward)    Start     Dose/Rate Route Frequency Ordered Stop   01/17/24 2100  cefTRIAXone  (ROCEPHIN ) 1 g in sodium chloride  0.9 % 100 mL IVPB        1 g 200 mL/hr over 30 Minutes Intravenous Every 24 hours 01/16/24 2355     01/16/24 2045  cefTRIAXone  (ROCEPHIN ) 1 g in sodium chloride  0.9 % 100 mL IVPB        1 g 200 mL/hr over 30 Minutes Intravenous  Once 01/16/24 2041 01/16/24 2219        DVT prophylaxis: Lovenox   Code Status: DNR  Family Communication: Discussed with patient's wife on phone   CONSULTS neurology   Subjective   Patient seen and examined, denies any new complaints   Objective    Physical Examination:   General-appears in no acute distress Heart-S1-S2, regular, no murmur  auscultated Lungs-clear to auscultation bilaterally, no wheezing or crackles auscultated Abdomen-soft, nontender, no organomegaly Extremities-no edema in the lower extremities Neuro-alert, oriented x3, mild left-sided weakness  Status is: Inpatient:      Wound 01/18/24 1846 Pressure Injury Sacrum Stage 2 -  Partial thickness loss of dermis presenting as a shallow open injury with a red, pink wound bed without slough. (Active)        John [name]   [facility] If 7PM-7AM, please contact night-coverage at [email or web address], Office  [phone]   01/19/2024, 8:08 AM  LOS: 0 days

## 2024-01-19 NOTE — Progress Notes (Signed)
  Inpatient Rehab Admissions Coordinator :  Per therapy recommendations, patient was screened for CIR candidacy by Ottie Glazier RN MSN.  At this time patient appears to be a potential candidate for CIR. I will place a rehab consult per protocol for full assessment. Please call me with any questions.  Ottie Glazier RN MSN Admissions Coordinator [phone]

## 2024-01-19 NOTE — Consult Note (Signed)
 NEUROLOGY CONSULT NOTE   Date of service: January 19, 2024 Patient Name: John [name] MRN:  [id number] DOB:  [birth year] Chief Complaint: L sided weakness Requesting Provider: Vernon Ranks, MD  History of Present Illness  John [name] is a 78 y.o. male with hx of GERD, hyperlipidemia, hypertension, diabetes, cognitive decline, prior stroke with residual L sided weakness who was just discharged from [facility] back to his home on 01/16/2024.  Upon discharge, patient was able to get into the car with assistance from family.  However, he was unable to get out of the car when they got home.  He was noted to have left upper extremity and left lower extremity weakness.  Despite several attempts, unclear last seen normal was unable to be established.  Patient was admitted and an MRI of the brain without contrast was obtained which demonstrated a acute right pontine infarct.   Neurology was consulted for further evaluation and workup.  LKW: 2 days ago Modified rankin score: 3 IV Thrombolysis: Not offered, outside of window.   EVT: Not offered, presentation not consistent with LVO.  NIHSS components Score: Comment  1a Level of Conscious 0[]  1[]  2[]  3[]      1b LOC Questions 0[]  1[]  2[]       1c LOC Commands 0[]  1[]  2[]       2 Best Gaze 0[]  1[]  2[]       3 Visual 0[]  1[]  2[]  3[]      4 Facial Palsy 0[]  1[]  2[]  3[]      5a Motor Arm - left 0[]  1[x]  2[]  3[]  4[]  UN[]    5b Motor Arm - Right 0[]  1[]  2[]  3[]  4[]  UN[]    6a Motor Leg - Left 0[]  1[x]  2[]  3[]  4[]  UN[]    6b Motor Leg - Right 0[]  1[]  2[]  3[]  4[]  UN[]    7 Limb Ataxia 0[]  1[x]  2[]  UN[]      8 Sensory 0[]  1[]  2[]  UN[]      9 Best Language 0[]  1[]  2[]  3[]      10 Dysarthria 0[]  1[]  2[]  UN[]      11 Extinct. and Inattention 0[]  1[]  2[]       TOTAL: 3      ROS  Comprehensive ROS performed and pertinent positives documented in HPI   Past History   Past Medical History:  Diagnosis Date   BPH (benign prostatic hyperplasia)    CTS  (carpal tunnel syndrome)    Depression    Diabetes mellitus    Essential hypertension    GERD (gastroesophageal reflux disease)    History of breast cancer    Hypercholesteremia    Neuropathy    Stroke [facility])     Past Surgical History:  Procedure Laterality Date   CATARACT EXTRACTION, BILATERAL  01/18/2017   CORONARY STENT INTERVENTION N/A 08/10/2023   Procedure: CORONARY STENT INTERVENTION;  Surgeon: Darron Deatrice LABOR, MD;  Location: [facility] INVASIVE CV LAB;  Service: Cardiovascular;  Laterality: N/A;   LEFT HEART CATH AND CORONARY ANGIOGRAPHY N/A 08/10/2023   Procedure: LEFT HEART CATH AND CORONARY ANGIOGRAPHY;  Surgeon: Darron Deatrice LABOR, MD;  Location: [facility] INVASIVE CV LAB;  Service: Cardiovascular;  Laterality: N/A;   LITHOTRIPSY     MASTECTOMY Right 05/02/2018   PACEMAKER IMPLANT N/A 05/13/2023   Procedure: PACEMAKER IMPLANT;  Surgeon: Kennyth Chew, MD;  Location: [facility] INVASIVE CV LAB;  Service: Cardiovascular;  Laterality: N/A;   TEMPORARY PACEMAKER N/A 05/11/2023   Procedure: TEMPORARY PACEMAKER;  Surgeon: Swaziland, Peter M, MD;  Location: [facility] INVASIVE CV LAB;  Service: Cardiovascular;  Laterality: N/A;   TONSILLECTOMY AND ADENOIDECTOMY     TRANSESOPHAGEAL ECHOCARDIOGRAM (CATH LAB) N/A 12/08/2023   Procedure: TRANSESOPHAGEAL ECHOCARDIOGRAM;  Surgeon: Raford Riggs, MD;  Location: [facility] INVASIVE CV LAB;  Service: Cardiovascular;  Laterality: N/A;    Family History: Family History  Problem Relation Age of Onset   Lymphoma Mother    Cancer Mother    Diabetes Father    Stroke Father    Ovarian cancer Sister    Cancer Sister    Cancer Paternal Grandmother     Social History  reports that he has never smoked. He has never used smokeless tobacco. He reports that he does not drink alcohol and does not use drugs.  Allergies  Allergen Reactions   Ramipril Anaphylaxis   Adhesive [Tape] Rash   Other Diarrhea    Severe intolerance to Chemotherapy in the past.   Sertraline Other  (See Comments)    Extreme headaches    Medications   Current Facility-Administered Medications:    0.9 %  sodium chloride  infusion, , Intravenous, Continuous, Zackowski, Scott, MD, Last Rate: 75 mL/hr at 01/18/24 2201, New Bag at 01/18/24 2201   acetaminophen  (TYLENOL ) tablet 650 mg, 650 mg, Oral, Q6H PRN **OR** acetaminophen  (TYLENOL ) suppository 650 mg, 650 mg, Rectal, Q6H PRN, Lou, Claretta HERO, MD   atorvastatin  (LIPITOR ) tablet 80 mg, 80 mg, Oral, QHS, Amponsah, Prosper M, MD, 80 mg at 01/18/24 2203   bisacodyl  (DULCOLAX) EC tablet 5 mg, 5 mg, Oral, Daily PRN, Lou Claretta HERO, MD   cefTRIAXone  (ROCEPHIN ) 1 g in sodium chloride  0.9 % 100 mL IVPB, 1 g, Intravenous, Q24H, Amponsah, Claretta HERO, MD, Last Rate: 200 mL/hr at 01/18/24 2203, 1 g at 01/18/24 2203   clopidogrel  (PLAVIX ) tablet 75 mg, 75 mg, Oral, Q breakfast, Lou Claretta HERO, MD, 75 mg at 01/18/24 0820   enoxaparin  (LOVENOX ) injection 40 mg, 40 mg, Subcutaneous, Q24H, Amponsah, Claretta HERO, MD, 40 mg at 01/18/24 1027   insulin  aspart (novoLOG ) injection 0-9 Units, 0-9 Units, Subcutaneous, TID WC, Amponsah, Prosper M, MD, 7 Units at 01/18/24 1751   ondansetron  (ZOFRAN ) tablet 4 mg, 4 mg, Oral, Q6H PRN **OR** ondansetron  (ZOFRAN ) injection 4 mg, 4 mg, Intravenous, Q6H PRN, Lou, Claretta HERO, MD   pantoprazole  (PROTONIX ) EC tablet 40 mg, 40 mg, Oral, QHS, Amponsah, Prosper M, MD, 40 mg at 01/18/24 2203   senna-docusate (Senokot-S) tablet 1 tablet, 1 tablet, Oral, QHS PRN, Lou Claretta HERO, MD  Vitals   Vitals:   01/18/24 1821 01/18/24 2156 01/19/24 0018 01/19/24 0404  BP: (!) 142/63 133/61 (!) 143/65 133/73  Pulse: 73 65 66 62  Resp: 19 18 19 19   Temp: 98.5 F (36.9 C) 98.4 F (36.9 C) 98.4 F (36.9 C) 98.8 F (37.1 C)  TempSrc: Oral Oral Oral   SpO2: 100% 98% 97% 100%    There is no height or weight on file to calculate BMI.   Physical Exam   General: Laying comfortably in bed; in no acute distress.   HENT: Normal oropharynx and mucosa. Normal external appearance of ears and nose.  Neck: Supple, no pain or tenderness  CV: No JVD. No peripheral edema.  Pulmonary: Symmetric Chest rise. Normal respiratory effort.  Abdomen: Soft to touch, non-tender.  Ext: No cyanosis, edema, or deformity  Skin: No rash. Normal palpation of skin.   Musculoskeletal: Normal digits and nails by inspection. No clubbing.   Neurologic Examination  Mental status/Cognition: Alert, oriented to self, place, month and  year, poor attention.  Bradyphrenic. Speech/language: Not fluent, comprehension intact, object naming intact, repetition intact.  Cranial nerves:   CN II Pupils equal and reactive to light, no VF deficits    CN III,IV,VI EOM intact, no gaze preference or deviation, no nystagmus    CN V normal sensation in V1, V2, and V3 segments bilaterally    CN VII no asymmetry, no nasolabial fold flattening    CN VIII normal hearing to speech    CN IX & X normal palatal elevation, no uvular deviation    CN XI 5/5 head turn and 5/5 shoulder shrug bilaterally    CN XII midline tongue protrusion    Motor:  Muscle bulk: Poor, tone normal, pronator drift noted in left upper extremity.  Mvmt Root Nerve  Muscle Right Left Comments  SA C5/6 Ax Deltoid 5 4   EF C5/6 Mc Biceps 5 4   EE C6/7/8 Rad Triceps 5 4   WF C6/7 Med FCR     WE C7/8 PIN ECU     F Ab C8/T1 U ADM/FDI 5 4   HF L1/2/3 Fem Illopsoas 5 4   KE L2/3/4 Fem Quad 5 5   DF L4/5 D Peron Tib Ant 5 5   PF S1/2 Tibial Grc/Sol 5 5    Sensation:  Light touch Intact throughout   Pin prick    Temperature    Vibration   Proprioception    Coordination/Complex Motor:  - Finger to Nose with slight ataxia left upper extremity - Heel to shin unable to get him to do. - Rapid alternating movement are slowed on the left. - Gait: Deferred for patient safety. Labs/Imaging/Neurodiagnostic studies   CBC:  Recent Labs  Lab 01/22/24 1659 01/17/24 0522  01/18/24 0417  WBC 8.5 8.1 7.8  NEUTROABS 5.9  --   --   HGB 10.0* 11.9* 12.4*  HCT 32.1* 36.2* 38.1*  MCV 92.2 89.2 88.4  PLT 209 230 250   Basic Metabolic Panel:  Lab Results  Component Value Date   NA 140 01/18/2024   K 3.8 01/18/2024   CO2 24 01/18/2024   GLUCOSE 185 (H) 01/18/2024   BUN 10 01/18/2024   CREATININE 0.63 01/18/2024   CALCIUM  8.9 01/18/2024   GFRNONAA >60 01/18/2024   GFRAA >60 10/27/2019   Lipid Panel:  Lab Results  Component Value Date   LDLCALC 34 01/19/2024   HgbA1c:  Lab Results  Component Value Date   HGBA1C 8.5 (H) 01/17/2024   Urine Drug Screen:     Component Value Date/Time   LABOPIA NONE DETECTED 08/07/2023 0659   COCAINSCRNUR NONE DETECTED 08/07/2023 0659   LABBENZ NONE DETECTED 08/07/2023 0659   AMPHETMU NONE DETECTED 08/07/2023 0659   THCU NONE DETECTED 08/07/2023 0659   LABBARB NONE DETECTED 08/07/2023 0659    Alcohol Level     Component Value Date/Time   ETH <15 08/06/2023 2344   INR  Lab Results  Component Value Date   INR 1.4 (H) 08/06/2023   APTT  Lab Results  Component Value Date   APTT 79 (H) 08/11/2023   AED levels: No results found for: PHENYTOIN, ZONISAMIDE, LAMOTRIGINE, LEVETIRACETA  CT Head without contrast(Personally reviewed): CTH was negative for a large hypodensity concerning for a large territory infarct or hyperdensity concerning for an ICH  CT angio Head and Neck with contrast(Personally reviewed): Pending  MRI Brain(Personally reviewed): Acute right pontine infarct.  MRI C-spine *personally reviewed): No obvious spinal cord stenosis.  ASSESSMENT  Jonpaul Lumm is a 78 y.o. male with hx of GERD, hyperlipidemia, hypertension, diabetes, cognitive decline, prior stroke with residual L sided weakness who presents with worsening left-sided weakness.  [facility] and eventually had an MRI of the brain which demonstrated an acute right pontine infarct.  Etiology of  the infarct is unclear but I suspect is likely secondary to small vessel disease.  He does have risk factors including diabetes, hypertension, hyperlipidemia.  RECOMMENDATIONS  - Frequent Neuro checks per stroke unit protocol - Recommend Vascular imaging with CT angio the head and neck. -No need for echocardiogram as he recently had TEE with EF of 30 to 35%, no LAA thrombus, no atrial level shunt. - Recommend obtaining Lipid panel with LDL - Please start statin if LDL > 70 - Recommend HbA1c to evaluate for diabetes and how well it is controlled. - Antithrombotic -aspirin  81 mg daily along with Plavix  75 mg daily for 21 days, followed by aspirin  81 mg daily alone. - Recommend DVT ppx - SBP goal -aim for gradual normotension. - Recommend Telemetry monitoring for arrythmia - Recommend bedside swallow screen prior to PO intake. - Stroke education booklet - Recommend PT/OT/SLP consult  ______________________________________________________________________  Plan discussed with Dr. Segars with the hospitalist team.  Signed, Solace Manwarren, MD [facility]

## 2024-01-19 NOTE — Consult Note (Signed)
 WOC Nurse Consult Note: Brought to hospital after patient was discharged from rehab facility and presented home with new onset left side weakness.  In ED was found to have UTI.  Foley catheter in place due to BPH.  Transferred to [facility] for MRI/brain and results revealed acute right pontine infarct.   Reason for Consult:Stage 2 pressure injury to sacrum.  Present on admission Wound type:stage 2 pressure Pressure Injury POA: Yes Measurement: 9 cm x 9 cm x 0.2 cm (scattered nonintact with nonblanchable erythema on periphery, along gluteal folds)  Wound azi:epwx and moist Drainage (amount, consistency, odor) scant weeping Periwound: intact Dressing procedure/placement/frequency: Gerhardts butt paste twice daily and PRN soilage.  Assist with turning/repositioning.  No disposable briefs or underpads.  Will not follow at this time.  Please re-consult if needed.  Darice Cooley MSN, RN, FNP-BC CWON Wound, Ostomy, Continence Nurse Outpatient [facility] [phone] Work cell phone:  [phone]

## 2024-01-19 NOTE — Progress Notes (Addendum)
 Physical Therapy Treatment Patient Details Name: John [name] MRN: [id number] DOB: 1945/04/25 Today's Date: 01/19/2024   History of Present Illness 78 y.o. male presents to [facility] 01/16/24 with L sided weakness and inability to get out of a car. MRI showed acute R pontine infarct. PMH: hypertension, hyperlipidemia, type 2 diabetes mellitus, history of CVA, cognitive deficits, CAD, heart block s/p PPM, chronic HFrEF, provoked DVT, BPH, urinary retention with Foley catheter   PT Comments  Pt supine in bed upon arrival and agreeable to PT session. Pt presents with impaired cognition, L sided weakness, impaired static/dynamic balance, and inability to take steps. Pt tends to have a left lateral lean in sitting and when standing with assist and cues needed to correct. Worked on midline positioning with R lateral elbow props and reaching activities. MinA-MaxA required to assist with balance. Pt was able to perform a few sit<>stand transfers with ModAx2 and use of RW. Increased difficulty with hip and knee extension with pt in a forward flexed position. Able to move left leg using heel/toe pivots, however, unable to take steps. Pt was able to stand for 5-10 seconds before needing to return to seated position to rest. Continue to recommend >3hrs post acute rehab to maximize rehab potential and improve mobility. Acute PT to follow.    If plan is discharge home, recommend the following: A lot of help with walking and/or transfers;A lot of help with bathing/dressing/bathroom;Assistance with cooking/housework;Help with stairs or ramp for entrance;Assist for transportation;Supervision due to cognitive status;Direct supervision/assist for medications management;Direct supervision/assist for financial management   Can travel by private vehicle     No  Equipment Recommendations  None recommended by PT    Recommendations for Other Services Rehab consult     Precautions / Restrictions Precautions Precautions:  Fall Restrictions Weight Bearing Restrictions Per Provider Order: No     Mobility  Bed Mobility Overal bed mobility: Needs Assistance Bed Mobility: Rolling, Sidelying to Sit, Sit to Supine Rolling: Mod assist, Used rails Sidelying to sit: Max assist, Used rails   Sit to supine: Mod assist, +2 for physical assistance, +2 for safety/equipment, Used rails   General bed mobility comments: cues for sequencing with assist at hips and shoulder to roll and to raise trunk. Assist to return to bed for LE management and to shift trunk    Transfers Overall transfer level: Needs assistance Equipment used: Rolling walker (2 wheels) Transfers: Sit to/from Stand Sit to Stand: Mod assist, +2 physical assistance, +2 safety/equipment    General transfer comment: ModAx2 to boost-up and steady with increased difficulty with hip and knee extension. Unable to reach upright posture. Able to heel/toe pivot but unable to take steps    Ambulation/Gait  General Gait Details: unable this date     Modified Rankin (Stroke Patients Only) Modified Rankin (Stroke Patients Only) Pre-Morbid Rankin Score: Moderately severe disability Modified Rankin: Severe disability     Balance Overall balance assessment: Needs assistance Sitting-balance support: Feet supported, Bilateral upper extremity supported Sitting balance-Leahy Scale: Poor Sitting balance - Comments: MinA-MaxA for L lateral lean, able to correct with increased time and assist Postural control: Left lateral lean Standing balance support: Reliant on assistive device for balance, Bilateral upper extremity supported Standing balance-Leahy Scale: Poor Standing balance comment: left lateral lean in standing, reliant on ModAx2       Communication Communication Communication: Impaired Factors Affecting Communication: Hearing impaired  Cognition Arousal: Alert Behavior During Therapy: WFL for tasks assessed/performed   PT - Cognitive impairments:  History of cognitive impairments, Problem solving, Awareness, Safety/Judgement, No family/caregiver present to determine baseline, Memory    PT - Cognition Comments: Required assist to dial  phone with pt unable to recall home phone number. Increased time to respond to questions Following commands: Impaired Following commands impaired: Follows one step commands inconsistently, Follows one step commands with increased time    Cueing Cueing Techniques: Verbal cues, Tactile cues, Visual cues  Exercises Other Exercises Other Exercises: prop on R elbow with push to midline Other Exercises: x5 reach for object across body using L hand Other Exercises: x3 sit<>stand w/ RW and ModAx2        Pertinent Vitals/Pain Pain Assessment Pain Assessment: Faces Faces Pain Scale: Hurts little more Pain Location: back Pain Descriptors / Indicators: Aching, Discomfort Pain Intervention(s): Limited activity within patient's tolerance, Monitored during session, Repositioned     PT Goals (current goals can now be found in the care plan section) Acute Rehab PT Goals PT Goal Formulation: Patient unable to participate in goal setting Time For Goal Achievement: 02/01/24 Potential to Achieve Goals: Fair Progress towards PT goals: Progressing toward goals    Frequency    Min 2X/week       AM-PAC PT 6 Clicks Mobility   Outcome Measure  Help needed turning from your back to your side while in a flat bed without using bedrails?: A Lot Help needed moving from lying on your back to sitting on the side of a flat bed without using bedrails?: A Lot Help needed moving to and from a bed to a chair (including a wheelchair)?: Total Help needed standing up from a chair using your arms (e.g., wheelchair or bedside chair)?: Total Help needed to walk in hospital room?: Total Help needed climbing 3-5 steps with a railing? : Total 6 Click Score: 8    End of Session Equipment Utilized During Treatment: Gait  belt Activity Tolerance: Patient tolerated treatment well Patient left: in bed;with bed alarm set;with call bell/phone within reach Nurse Communication: Mobility status;Other (comment);Need for lift equipment PT Visit Diagnosis: Unsteadiness on feet (R26.81);Muscle weakness (generalized) (M62.81);Other abnormalities of gait and mobility (R26.89);Hemiplegia and hemiparesis Hemiplegia - Right/Left: Left Hemiplegia - dominant/non-dominant: Non-dominant Hemiplegia - caused by: Cerebral infarction     Time: 8641-8577 PT Time Calculation (min) (ACUTE ONLY): 24 min  Charges:    $Therapeutic Exercise: 8-22 mins $Therapeutic Activity: 8-22 mins PT General Charges $$ ACUTE PT VISIT: 1 Visit                    Kate ORN, PT, DPT Secure Chat Preferred  Rehab Office [phone]   Kate BRAVO Wendolyn 01/19/2024, 3:28 PM

## 2024-01-19 NOTE — Inpatient Diabetes Management (Signed)
 Inpatient Diabetes Program Recommendations  AACE/ADA: New Consensus Statement on Inpatient Glycemic Control (2015)  Target Ranges:  Prepandial:   less than 140 mg/dL      Peak postprandial:   less than 180 mg/dL (1-2 hours)      Critically ill patients:  140 - 180 mg/dL   Lab Results  Component Value Date   GLUCAP 195 (H) 01/19/2024   HGBA1C 8.5 (H) 01/17/2024    Review of Glycemic Control  Latest Reference Range & Units 01/18/24 03:57 01/18/24 08:13 01/18/24 13:48 01/18/24 17:38 01/18/24 22:07 01/19/24 06:15  Glucose-Capillary 70 - 99 mg/dL 811 (H) 823 (H) 780 (H) 315 (H) 195 (H) 195 (H)   Diabetes history: DM Outpatient Diabetes medications:  Lantus  15 units daily Metformin  500 mg bid  Current orders for Inpatient glycemic control:  Novolog  0-9 units tid with meals  Inpatient Diabetes Program Recommendations:    Please consider adding Lantus  10 units daily.   Thanks,  Randall Bullocks, RN, BC-ADM Inpatient Diabetes Coordinator Pager [phone]  (8a-5p)

## 2024-01-19 NOTE — TOC Initial Note (Signed)
 Transition of Care [facility]) - Initial/Assessment Note    Patient Details  Name: John [name] MRN: [id number] Date of Birth: Jun 06, 1945  Transition of Care [facility]) CM/SW Contact:    John JULIANNA George, RN Phone Number: 01/19/2024, 1:47 PM  Clinical Narrative:                 John [name] is a 78 y.o. male with medical history significant for hypertension, hyperlipidemia, type 2 diabetes mellitus, history of CVA, cognitive deficits, CAD, heart block s/p PPM, chronic HFrEF, provoked DVT no longer anticoagulated, BPH, urinary retention with Foley catheter, and recent admissions with UTI, bacteremia, pneumonia, nausea and vomiting and discharged to SNF [facility]) now presenting with left-sided weakness.  Pt is from home with his spouse. She is with him most of the time.  Spouse provides needed transportation and manages his medications.   Current recommendations are for CIR. Awaiting eval.   IP Care management following.  Expected Discharge Plan: IP Rehab Facility Barriers to Discharge: Continued Medical Work up   Patient Goals and CMS Choice   CMS Medicare.gov Compare Post Acute Care list provided to:: Patient Choice offered to / list presented to : Patient, Spouse      Expected Discharge Plan and Services   Discharge Planning Services: CM Consult Post Acute Care Choice: IP Rehab Living arrangements for the past 2 months: Single Family Home                                      Prior Living Arrangements/Services Living arrangements for the past 2 months: Single Family Home Lives with:: Spouse Patient language and need for interpreter reviewed:: Yes Do you feel safe going back to the place where you live?: Yes        Care giver support system in place?: Yes (comment) Current home services: DME (walker/ shower seat) Criminal Activity/Legal Involvement Pertinent to Current Situation/Hospitalization: No - Comment as needed  Activities of Daily Living       Permission Sought/Granted                  Emotional Assessment Appearance:: Appears stated age Attitude/Demeanor/Rapport: Engaged Affect (typically observed): Accepting Orientation: : Oriented to Self, Oriented to Place   Psych Involvement: No (comment)  Admission diagnosis:  Weakness [R53.1] Left-sided weakness [R53.1] Acute cystitis without hematuria [N30.00] Cerebrovascular accident (CVA), unspecified mechanism (HCC) [I63.9] Patient Active Problem List   Diagnosis Date Noted   Acute cystitis without hematuria 01/17/2024   History of CVA (cerebrovascular accident) 01/17/2024   Weakness 01/17/2024   Left-sided weakness 01/16/2024   Intractable nausea and vomiting 12/26/2023   Constipation 12/26/2023   AMS (altered mental status) 12/13/2023   Altered mental status 12/09/2023   Nausea & vomiting 12/09/2023   Status post placement of cardiac pacemaker 12/09/2023   Sepsis (HCC) 12/04/2023   CAD S/P percutaneous coronary angioplasty 12/04/2023   HFrEF (heart failure with reduced ejection fraction) (HCC) 08/17/2023   DVT, femoral, chronic (HCC) 08/15/2023   Bladder outlet obstruction 08/15/2023   Sepsis due to Escherichia coli (HCC) 08/15/2023   Debility 08/13/2023   Non-ST elevation (NSTEMI) myocardial infarction (HCC) 08/10/2023   Cholelithiasis 08/07/2023   VTE (venous thromboembolism) 08/07/2023   Lactic acidosis 08/07/2023   Sacral pressure ulcer 08/07/2023   Streptococcal bacteremia 08/07/2023   B12 deficiency 08/07/2023   Iron deficiency anemia 08/07/2023   Severe sepsis (HCC) 08/06/2023  Chronic diastolic CHF (congestive heart failure) (HCC) 08/06/2023   Bacteriuria 08/06/2023   Essential hypertension 06/15/2023   Situational depression 06/15/2023   Protein-calorie malnutrition, severe 05/12/2023   Heart block AV complete (HCC) 05/12/2023   Syncope 05/10/2023   Fall at home, initial encounter 05/08/2023   Hypotension 05/08/2023   Abnormal LFTs  05/08/2023   Anemia 05/08/2023   Coping style affecting medical condition 05/02/2023   Acute renal failure superimposed on chronic kidney disease 04/28/2023   Brainstem infarct, acute (HCC) 04/25/2023   Pressure injury of skin 04/25/2023   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   Acute kidney injury superimposed on chronic kidney disease 04/17/2023   DKA (diabetic ketoacidosis) (HCC) 04/16/2023   Type 2 diabetes mellitus with hyperglycemia, with long-term current use of insulin  (HCC) 07/26/2022   Bronchitis 03/20/2021   Acute non-recurrent frontal sinusitis 03/20/2021   Viral URI with cough 01/13/2021   Hyperlipidemia 11/21/2020   Dizziness and giddiness 10/20/2020   Localized swelling of both lower extremities 10/20/2020   Tendinitis of right hip flexor 10/20/2020   Aortic atherosclerosis 09/10/2020   Insomnia 09/10/2020   Cognitive impairment 08/18/2020   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   Urinary retention 06/04/2020   History of COVID-19 12/10/2019   Physical deconditioning 12/10/2019   Fatigue 12/10/2019   Unsteady gait 12/10/2019   Pneumonia due to COVID-19 virus 10/26/2019   Peripheral neuropathy 10/17/2019   Depression, major, single episode, complete remission 10/17/2019   History of breast cancer    GERD (gastroesophageal reflux disease)    BPH (benign prostatic hyperplasia)    CTS (carpal tunnel syndrome)    Diabetic polyneuropathy associated with type 2 diabetes mellitus (HCC) 09/29/2019   Neuropathic pain of foot 09/29/2019   Foley catheter in place 01/03/2019   Peripheral sensory neuropathy 05/17/2018   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   Hypomagnesemia 01/18/2018   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) 12/13/2017   Axillary adenopathy 12/09/2017   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   S/P cataract extraction and insertion of intraocular lens 01/17/2017   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 [place] [address] Phone: [phone] Fax: [phone]  [place] [facility] [address] [place] [address] Phone: [phone] Fax: [phone]     Social Drivers of Health (SDOH) Social History: SDOH Screenings   Food Insecurity: No Food Insecurity (12/27/2023)  Housing: Low Risk  (12/27/2023)  Transportation Needs: No Transportation Needs (12/27/2023)  Utilities: Not At Risk (12/27/2023)  Alcohol Screen: Low Risk  (01/27/2023)  Depression (PHQ2-9): Low Risk  (01/28/2023)  Financial Resource Strain: Low Risk  (10/03/2023)  Physical Activity: Inactive (10/03/2023)  Social Connections: Unknown (12/27/2023)  Recent Concern: Social Connections - Socially Isolated (12/05/2023)  Stress: No Stress Concern Present (10/03/2023)  Tobacco Use: Low Risk  (01/16/2024)   SDOH Interventions:     Readmission  Risk Interventions    12/06/2023    4:03 PM 05/11/2023   12:52 PM 04/22/2023   12:12 PM  Readmission Risk Prevention Plan  Medication Screening   Complete  Transportation Screening Complete Complete Complete  PCP or Specialist Appt within 3-5 Days  Complete   HRI or Home Care Consult  Complete   Social Work Consult for Recovery Care Planning/Counseling  Complete   Palliative Care Screening  Not Applicable   Medication Review Oceanographer)  Complete Complete   HRI or Home Care Consult Complete    SW Recovery Care/Counseling Consult Complete    Palliative Care Screening Not Applicable    Skilled Nursing Facility Not Applicable

## 2024-01-19 NOTE — Care Management Obs Status (Signed)
 MEDICARE OBSERVATION STATUS NOTIFICATION   Patient Details  Name: John [name] MRN: [id number] Date of Birth: [birth year]   Medicare Observation Status Notification Given:  Yes Verbally reviewed observation notice with [name] telephonically at [phone].  Will deliver a copy to the patient room.   Gusta Marksberry 01/19/2024, 10:49 AM

## 2024-01-19 NOTE — Progress Notes (Addendum)
 STROKE TEAM PROGRESS NOTE    INTERIM HISTORY/SUBJECTIVE Patient laying in the bed in no apparent distress.  He states yesterday he was trying to get out of the car and he could not he had left-sided weakness MRI brain shows acute right pontine infarct..  Several old lacunar infarcts.     CBC    Component Value Date/Time   WBC 7.8 01/18/2024 0417   RBC 4.31 01/18/2024 0417   HGB 12.4 (L) 01/18/2024 0417   HCT 38.1 (L) 01/18/2024 0417   PLT 250 01/18/2024 0417   MCV 88.4 01/18/2024 0417   MCH 28.8 01/18/2024 0417   MCHC 32.5 01/18/2024 0417   RDW 17.9 (H) 01/18/2024 0417   LYMPHSABS 1.8 01/16/2024 1659   MONOABS 0.6 01/16/2024 1659   EOSABS 0.1 01/16/2024 1659   BASOSABS 0.0 01/16/2024 1659    BMET    Component Value Date/Time   NA 140 01/18/2024 0417   K 3.8 01/18/2024 0417   CL 106 01/18/2024 0417   CO2 24 01/18/2024 0417   GLUCOSE 185 (H) 01/18/2024 0417   BUN 10 01/18/2024 0417   CREATININE 0.63 01/18/2024 0417   CREATININE 1.02 12/12/2019 0927   CALCIUM  8.9 01/18/2024 0417   GFRNONAA >60 01/18/2024 0417    IMAGING past 24 hours MR Cervical Spine W and Wo Contrast Result Date: 01/18/2024 EXAM: MRI CERVICAL SPINE WITH AND WITHOUT CONTRAST 01/18/2024 12:44:00 PM TECHNIQUE: Multiplanar multisequence MRI of the cervical spine was performed without and with the administration of 8 mL gadobutrol  (GADAVIST ) 1 MMOL/ML injection. COMPARISON: Cervical spine CT 05/09/2023. 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 FINDINGS: BONES AND ALIGNMENT: Improved cervical lordosis compared to 05/09/2023. Normal vertebral body heights. Normal background bone marrow signal. No marrow edema. No abnormal enhancement. Age appropriate cervical spine degeneration. No degenerative cervical spinal stenosis. SPINAL CORD: No convincing spinal cord signal abnormality. No abnormal intradural enhancement. No dural thickening. SOFT TISSUES: No paraspinal mass. Trace retained  secretions in the visible pharynx. Preserved major vascular flow voids in the neck. No significant spinal canal stenosis. IMPRESSION: 1. Age-appropriate cervical spine degeneration without significant spinal stenosis. 2. Visible spinal cord within normal limits. 3. See Brain MRI reported separately today. Electronically signed by: Helayne Hurst MD 01/18/2024 01:38 PM EDT RP Workstation: [id number]   MR Brain Wo Contrast (neuro protocol) Result Date: 01/18/2024 EXAM: MR Brain without Intravenous Contrast. CLINICAL HISTORY: Patient presents with acute neuro deficit, stroke suspected. TECHNIQUE: Magnetic resonance images of the brain without intravenous contrast in multiple planes. CONTRAST: Without. COMPARISON: Head CT 01/16/2024, Brain MRI 05/08/2023. FINDINGS: BRAIN: 7 mm focus of restricted diffusion in the right pons (series 2 image 15), indicating acute infarction. Punctate petechial hemorrhage is present, with pontine hemosiderin increased on series 7 image 28 since January, but no malignant hemorrhagic transformation. No mass effect. No other diffusion restriction. Advanced chronic small vessel disease with stable chronic lacunar infarcts in the bilateral cerebral white matter, right lentiform nucleus, and bilateral thalami. Occasional chronic microhemorrhages. No midline shift or extra-axial fluid collection. No cerebellar tonsillar ectopia. The central arterial and venous flow voids are patent. VENTRICLES: No hydrocephalus. ORBITS: The orbits are normal. SINUSES AND MASTOIDS: The sinuses and mastoid air cells are clear. BONES: No acute fracture or focal osseous lesion. IMPRESSION: 1. Acute right pontine infarct (7 mm) with possible punctate petechial hemorrhage, but No malignant hemorrhagic transformation and no mass effect. 2. No additional acute findings. Underlying Advanced chronic small vessel disease. Electronically signed by: Helayne Hurst  MD 01/18/2024 01:34 PM EDT RP Workstation: [id number]     Vitals:   01/18/24 2156 01/19/24 0018 01/19/24 0404 01/19/24 0759  BP: 133/61 (!) 143/65 133/73 (!) 168/62  Pulse: 65 66 62 61  Resp: 18 19 19 19   Temp: 98.4 F (36.9 C) 98.4 F (36.9 C) 98.8 F (37.1 C) 97.7 F (36.5 C)  TempSrc: Oral Oral  Oral  SpO2: 98% 97% 100% 100%     PHYSICAL EXAM General:  Alert, well-nourished, well-developed patient in no acute distress Psych:  Mood and affect appropriate for situation CV: Regular rate and rhythm on monitor Respiratory:  Regular, unlabored respirations on room air GI: Abdomen soft and nontender   NEURO:  Mental Status: AA&Ox3, patient is able to give clear and coherent history Speech/Language: speech is without dysarthria or aphasia.  Naming, repetition, fluency, and comprehension intact.  Cranial Nerves:  II: PERRL. Visual fields full.  III, IV, VI: EOMI. Eyelids elevate symmetrically.  Saccadic dysmetria V: Sensation is intact to light touch and symmetrical to face.  VII: Face is symmetrical resting and smiling VIII: hearing intact to voice. IX, X: Palate elevates symmetrically. Phonation is normal.  KP:Dynloizm shrug 5/5. XII: tongue is midline without fasciculations. Motor: 5/5 strength in right arm and leg, left arm with drift, left leg with subtle drift decreased fine motor in right left, right over left orbiting Tone: is normal and bulk is normal Sensation- Intact to light touch bilaterally. Extinction absent to light touch to DSS.   Coordination: FTN intact bilaterally, HKS: no ataxia in BLE.No drift.  Decreased fine motor on the left but left orbiting  Gait- deferred   ASSESSMENT/PLAN  [name] is a 78 y.o. male with history of GERD, hyperlipidemia, hypertension, diabetes, cognitive decline, prior stroke with residual L sided weakness who was just discharged from [place] [facility] rehab back to his home on 01/16/2024.  Upon discharge, patient was able to get into the car with assistance from family.   However, he was unable to get out of the car when they got home.    NIH on Admission 3  Acute Ischemic Infarct:  right pontine with ?petechial hemorrhage  Etiology:  small vessel disease   CT head No acute abnormality.  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 Advanced chronic small vessel disease. TEE 12/08/2023 EF 30-35%. LV with global hypokinesis  LDL 34 HgbA1c 8.5 VTE prophylaxis - lovenox   No antithrombotic prior to admission, now on aspirin  81 mg daily and clopidogrel  75 mg daily for 3 weeks and then ASA alone. 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 Yes; Fluid consistency: Thin   Advance diet as tolerated  Other Stroke Risk Factors Coronary artery disease Congestive heart failure  Other Active Problems GERD BPH  Hospital day #  0  Karna Geralds DNP, ACNPC-AG  [facility]  I have personally obtained history,examined this patient, reviewed notes, independently viewed imaging studies, participated in medical decision making and plan of care.ROS completed by me personally and pertinent positives fully documented  I have made any additions or clarifications directly to the above note. Agree with note above.  Patient  presented with sudden onset left hemiparesis due to right pontine lacunar infarct likely from small vessel disease.  Continue ongoing stroke workup.  Recommend aspirin  and Plavix  for 3 weeks followed by aspirin  alone.  Aggressive risk factor modification.  Malaise out of bed.  Therapy consult.   I personally spent a total of 50 minutes in the care of the patient today including getting/reviewing separately obtained history, performing a medically appropriate exam/evaluation, counseling and educating, placing orders, referring and communicating with other health care professionals, documenting clinical information in the EHR, independently interpreting results, and coordinating care.   Stroke team will sign off.  Kindly call for questions.  Discussed with Dr. Drusilla.  Follow-up as an outpatient stroke clinic in 2 months    Eather Popp, MD Medical Director [facility] Pager: [phone] 01/19/2024 4:06 PM   To contact Stroke Continuity provider, please refer to [email or web address]. After hours, contact General Neurology

## 2024-01-20 ENCOUNTER — Ambulatory Visit: Admitting: Cardiology

## 2024-01-20 ENCOUNTER — Encounter (HOSPITAL_COMMUNITY)
Admission: EM | Disposition: A | Discharge status: Other Acute Inpatient Hospital | Payer: Self-pay | Source: Home / Self Care | Attending: Emergency Medicine

## 2024-01-20 DIAGNOSIS — R531 Weakness: Secondary | ICD-10-CM | POA: Diagnosis not present

## 2024-01-20 DIAGNOSIS — Z950 Presence of cardiac pacemaker: Secondary | ICD-10-CM | POA: Diagnosis not present

## 2024-01-20 DIAGNOSIS — Z8673 Personal history of transient ischemic attack (TIA), and cerebral infarction without residual deficits: Secondary | ICD-10-CM | POA: Diagnosis not present

## 2024-01-20 DIAGNOSIS — N3000 Acute cystitis without hematuria: Secondary | ICD-10-CM | POA: Diagnosis not present

## 2024-01-20 LAB — GLUCOSE, CAPILLARY
Glucose-Capillary: 231 mg/dL — ABNORMAL HIGH (ref 70–99)
Glucose-Capillary: 326 mg/dL — ABNORMAL HIGH (ref 70–99)
Glucose-Capillary: 241 mg/dL — ABNORMAL HIGH (ref 70–99)
Glucose-Capillary: 260 mg/dL — ABNORMAL HIGH (ref 70–99)

## 2024-01-20 SURGERY — TRANSESOPHAGEAL ECHOCARDIOGRAM (TEE) (CATHLAB)
Anesthesia: Monitor Anesthesia Care

## 2024-01-20 MED ORDER — FOSFOMYCIN TROMETHAMINE 3 G PO PACK
3.0000 g | PACK | Freq: Once | ORAL | Status: AC
  Administered 2024-01-20: 3 g via ORAL
  Filled 2024-01-20 (×2): qty 3

## 2024-01-20 MED ORDER — INSULIN GLARGINE 100 UNIT/ML ~~LOC~~ SOLN
5.0000 [IU] | Freq: Every day | SUBCUTANEOUS | Status: DC
  Administered 2024-01-20 – 2024-01-23 (×4): 5 [IU] via SUBCUTANEOUS
  Filled 2024-01-20 (×5): qty 0.05

## 2024-01-20 MED ORDER — CHLORHEXIDINE GLUCONATE CLOTH 2 % EX PADS
6.0000 | MEDICATED_PAD | Freq: Every day | CUTANEOUS | Status: DC
  Administered 2024-01-20 – 2024-01-24 (×5): 6 via TOPICAL

## 2024-01-20 MED ORDER — MELATONIN 3 MG PO TABS
6.0000 mg | ORAL_TABLET | Freq: Every evening | ORAL | Status: DC | PRN
Start: 2024-01-20 — End: 2024-01-24
  Administered 2024-01-20 – 2024-01-22 (×3): 6 mg via ORAL
  Filled 2024-01-20 (×3): qty 2

## 2024-01-20 NOTE — Progress Notes (Signed)
 Inpatient Rehab Admissions:  Inpatient Rehab Consult received.  I met with patient at the bedside for rehabilitation assessment and to discuss goals and expectations of an inpatient rehab admission.  Discussed average length of stay, insurance authorization requirement and discharge home after completion of CIR. Pt acknowledged understanding and is interested in pursuing CIR. Pt gave permission to contact wife [name]. Spoke with [name] on the telephone. She also acknowledged understanding and is supportive of pt pursuing CIR. She confirmed that she will be able to provide 24/7 support for pt after discharge. Will continue to follow.  Signed: Tinnie Yvone Cohens, MS, CCC-SLP Admissions Coordinator [phone]

## 2024-01-20 NOTE — Progress Notes (Signed)
 [facility]  PROGRESS NOTE  John [name] FMW:[id number] DOB: 1946-01-05 DOA: 01/16/2024 PCP: [name], DO   Brief HPI:    78 y.o. male with medical history significant for hypertension, hyperlipidemia, type 2 diabetes mellitus, history of CVA, cognitive deficits, CAD, heart block s/p PPM chronic HFrEF, provoked DVT no longer anticoagulated, BPH, urinary retention with Foley catheter, and recent admissions with UTI, bacteremia, pneumonia, nausea and vomiting and discharged to SNF now presenting with left-sided weakness.     Assessment/Plan:   Acute pontine infarct - MRI brain showed acute right pontine infarct -Neurology consulted; likely lacunar infarct -CT head and neck showed severe right posterior cerebral artery stenosis - Recently had TEE on 12/08/2023 which showed EF of 30 to 35% with LV global hypokinesis -A1c 8.5 -Neurology recommends to continue aspirin  and Plavix  for 3 weeks and then aspirin  alone -PT evaluation obtained, plan to go to CIR - Atorvastatin  80 mg daily   Acute cystitis Urine culture grew ESBL E. Coli - Cefepime  was discontinued and patient started on meropenem -He is afebrile, vitals are stable. -Will discontinue meropenem and give 1 dose of fosfomycin   T2DM - Last A1c 7.5% in April 2025 - SSI with meals, CBG monitoring -CBGs elevated -Will add Lantus  5 units subcu daily     HTN/HFrEF - Last TTE on 11/2023 showed EF 30-35%, LV global hypokinesis and mild protruding plaque involving the aortic root - Patient euvolemic on exam - Discontinued losartan  for permissive hypertension.   Hx of CVA - History of multiple lacunar infarcts - Plavix     History of urinary retention/ BPH - Continue Foley catheter-- will need outpatient follow up   Heart block s/p PPM - has pacemaker    CAD - Continue atorvastatin  -It seems patient was not taking Plavix  before he came to the hospital    HLD -atorvastatin    GERD - Continue  Protonix     Medications     aspirin  EC  81 mg Oral Daily   atorvastatin   80 mg Oral QHS   clopidogrel   75 mg Oral Q breakfast   enoxaparin  (LOVENOX ) injection  40 mg Subcutaneous Q24H   Gerhardt's butt cream   Topical BID   insulin  aspart  0-9 Units Subcutaneous TID WC   pantoprazole   40 mg Oral QHS     Data Reviewed:   CBG:  Recent Labs  Lab 01/19/24 1211 01/19/24 1539 01/19/24 2114 01/20/24 0618 01/20/24 1200  GLUCAP 180* 235* 250* 231* 326*    SpO2: 99 %    Vitals:   01/19/24 2357 01/20/24 0359 01/20/24 0832 01/20/24 1153  BP: (!) 151/72 (!) 182/68 (!) 143/68 (!) 146/84  Pulse: 80 60 62 85  Resp: 16 16 18    Temp: 98.5 F (36.9 C) 98.2 F (36.8 C) 98.1 F (36.7 C) 98.4 F (36.9 C)  TempSrc: Oral Oral Oral Oral  SpO2: 97% 99% 99% 99%      Data Reviewed:  Basic Metabolic Panel: Recent Labs  Lab 01/16/24 1659 01/17/24 0522 01/18/24 0417  NA 138 141 140  K 4.1 3.6 3.8  CL 104 109 106  CO2 21* 22 24  GLUCOSE 164* 143* 185*  BUN 13 11 10   CREATININE 0.74 0.55* 0.63  CALCIUM  9.4 8.2* 8.9    CBC: Recent Labs  Lab 01/16/24 1659 01/17/24 0522 01/18/24 0417  WBC 8.5 8.1 7.8  NEUTROABS 5.9  --   --   HGB 10.0* 11.9* 12.4*  HCT 32.1* 36.2* 38.1*  MCV  92.2 89.2 88.4  PLT 209 230 250    LFT Recent Labs  Lab 01/16/24 1659  AST 27  ALT 20  ALKPHOS 105  BILITOT 1.2  PROT 5.7*  ALBUMIN 3.5     Antibiotics: Anti-infectives (From admission, onward)    Start     Dose/Rate Route Frequency Ordered Stop   01/20/24 1045  fosfomycin (MONUROL) packet 3 g        3 g Oral  Once 01/20/24 0948 01/20/24 1157   01/19/24 1800  meropenem (MERREM) 1 g in sodium chloride  0.9 % 100 mL IVPB  Status:  Discontinued        1 g 200 mL/hr over 30 Minutes Intravenous Every 8 hours 01/19/24 1629 01/20/24 0948   01/17/24 2100  cefTRIAXone  (ROCEPHIN ) 1 g in sodium chloride  0.9 % 100 mL IVPB  Status:  Discontinued        1 g 200 mL/hr over 30 Minutes  Intravenous Every 24 hours 01/16/24 2355 01/19/24 1625   01/16/24 2045  cefTRIAXone  (ROCEPHIN ) 1 g in sodium chloride  0.9 % 100 mL IVPB        1 g 200 mL/hr over 30 Minutes Intravenous  Once 01/16/24 2041 01/16/24 2219        DVT prophylaxis: Lovenox   Code Status: DNR  Family Communication: Discussed with patient's wife on phone   CONSULTS neurology   Subjective   Denies any complaints   Objective    Physical Examination:   General-appears in no acute distress Heart-S1-S2, regular, no murmur auscultated Lungs-clear to auscultation bilaterally, no wheezing or crackles auscultated Abdomen-soft, nontender, no organomegaly Extremities-no edema in the lower extremities Neuro-alert, oriented x3, no focal deficit noted  Status is: Inpatient:      Wound 01/18/24 1846 Pressure Injury Sacrum Stage 2 -  Partial thickness loss of dermis presenting as a shallow open injury with a red, pink wound bed without slough. (Active)        John [name]   [facility] If 7PM-7AM, please contact night-coverage at [email or web address], Office  [phone]   01/20/2024, 3:23 PM  LOS: 0 days

## 2024-01-20 NOTE — Progress Notes (Addendum)
 Physical Therapy Treatment Patient Details Name: John [name] MRN: [id number] DOB: 13-Mar-1946 Today's Date: 01/20/2024   History of Present Illness 78 y.o. male presents to [facility] 01/16/24 with L sided weakness and inability to get out of a car. MRI showed acute R pontine infarct. PMH: hypertension, hyperlipidemia, type 2 diabetes mellitus, history of CVA, cognitive deficits, CAD, heart block s/p PPM, chronic HFrEF, provoked DVT, BPH, urinary retention with Foley catheter   PT Comments  Pt supine in bed and agreeable to PT session. Worked on midline positioning in sitting and standing as pt tends to lean to the left. MinA to ModA to correct balance with visual feedback from mirror. Used Stedy to work on [employer] with pt able to maintain stand for ~45 seconds before needing a seated rest break. Able to work on weight-shifting in standing with use of mirror and frequent cueing to maintain upright posture. Continue to recommend >3hrs post acute rehab to maximize rehab potential. Acute PT to follow.     If plan is discharge home, recommend the following: A lot of help with walking and/or transfers;A lot of help with bathing/dressing/bathroom;Assistance with cooking/housework;Help with stairs or ramp for entrance;Assist for transportation;Supervision due to cognitive status;Direct supervision/assist for medications management;Direct supervision/assist for financial management   Can travel by private vehicle     No  Equipment Recommendations  None recommended by PT       Precautions / Restrictions Precautions Precautions: Fall Restrictions Weight Bearing Restrictions Per Provider Order: No     Mobility  Bed Mobility Overal bed mobility: Needs Assistance Bed Mobility: Rolling, Sidelying to Sit, Sit to Supine Rolling: Mod assist, Used rails Sidelying to sit: Mod assist, +2 for safety/equipment, +2 for physical assistance, Used rails   Sit to supine: Mod assist, +2 for physical  assistance, +2 for safety/equipment   General bed mobility comments: ModA to roll and ModAx2 to raise trunk and LE managment    Transfers Overall transfer level: Needs assistance Equipment used: Ambulation equipment used Transfers: Sit to/from Stand Sit to Stand: Via lift equipment, Mod assist, +2 physical assistance, +2 safety/equipment, Min assist    General transfer comment: ModAx2 from low bed height, MinA from stedy flaps. Left lateral lean in standing, able to correct with increased time and visual cues with the mirror    Modified Rankin (Stroke Patients Only) Modified Rankin (Stroke Patients Only) Pre-Morbid Rankin Score: Moderately severe disability Modified Rankin: Severe disability     Balance Overall balance assessment: Needs assistance Sitting-balance support: Feet supported, Bilateral upper extremity supported Sitting balance-Leahy Scale: Poor Sitting balance - Comments: MinA-ModA for L lateral lean, able to correct with increased time and assist Postural control: Left lateral lean Standing balance support: Reliant on assistive device for balance, Bilateral upper extremity supported Standing balance-Leahy Scale: Poor Standing balance comment: left lateral lean in standing, reliant on ModAx2       Communication Communication Communication: Impaired Factors Affecting Communication: Hearing impaired  Cognition Arousal: Alert Behavior During Therapy: Flat affect   PT - Cognitive impairments: History of cognitive impairments, Problem solving, Awareness, Safety/Judgement, No family/caregiver present to determine baseline, Memory, Sequencing    PT - Cognition Comments: multimodal cueing with increased time to process and respond. Limited verbalizations Following commands: Impaired Following commands impaired: Follows one step commands inconsistently, Follows one step commands with increased time    Cueing Cueing Techniques: Verbal cues, Tactile cues, Visual cues   Exercises Other Exercises Other Exercises: balance in sitting with visual feedback from mirror Other  Exercises: x3 sit<>stand from elevated stedy flaps, MinA with assist for left lateral lean Other Exercises: Bx5 LAQ        Pertinent Vitals/Pain Pain Assessment Pain Assessment: No/denies pain     PT Goals (current goals can now be found in the care plan section) Acute Rehab PT Goals PT Goal Formulation: Patient unable to participate in goal setting Time For Goal Achievement: 02/01/24 Potential to Achieve Goals: Fair Progress towards PT goals: Progressing toward goals    Frequency    Min 2X/week           Co-evaluation   Reason for Co-Treatment: For patient/therapist safety;To address functional/ADL transfers PT goals addressed during session: Mobility/safety with mobility;Proper use of DME;Balance        AM-PAC PT 6 Clicks Mobility   Outcome Measure  Help needed turning from your back to your side while in a flat bed without using bedrails?: A Lot Help needed moving from lying on your back to sitting on the side of a flat bed without using bedrails?: Total Help needed moving to and from a bed to a chair (including a wheelchair)?: Total Help needed standing up from a chair using your arms (e.g., wheelchair or bedside chair)?: Total Help needed to walk in hospital room?: Total Help needed climbing 3-5 steps with a railing? : Total 6 Click Score: 7    End of Session Equipment Utilized During Treatment: Gait belt Activity Tolerance: Patient tolerated treatment well Patient left: in bed;with bed alarm set;with call bell/phone within reach Nurse Communication: Mobility status;Need for lift equipment PT Visit Diagnosis: Unsteadiness on feet (R26.81);Muscle weakness (generalized) (M62.81);Other abnormalities of gait and mobility (R26.89);Hemiplegia and hemiparesis Hemiplegia - Right/Left: Left Hemiplegia - dominant/non-dominant: Non-dominant Hemiplegia - caused by:  Cerebral infarction     Time: 0830-0857 PT Time Calculation (min) (ACUTE ONLY): 27 min  Charges:    $Neuromuscular Re-education: 8-22 mins PT General Charges $$ ACUTE PT VISIT: 1 Visit                    Kate ORN, PT, DPT Secure Chat Preferred  Rehab Office [phone]   Kate BRAVO Wendolyn 01/20/2024, 10:59 AM

## 2024-01-20 NOTE — Inpatient Diabetes Management (Signed)
 Inpatient Diabetes Program Recommendations  AACE/ADA: New Consensus Statement on Inpatient Glycemic Control (2015)  Target Ranges:  Prepandial:   less than 140 mg/dL      Peak postprandial:   less than 180 mg/dL (1-2 hours)      Critically ill patients:  140 - 180 mg/dL    Latest Reference Range & Units 01/19/24 06:15 01/19/24 12:11 01/19/24 15:39 01/19/24 21:14  Glucose-Capillary 70 - 99 mg/dL 804 (H)  2 units Novolog   180 (H)  2 units Novolog   235 (H)  3 units Novolog   250 (H)  (H): Data is abnormally high  Latest Reference Range & Units 01/20/24 06:18  Glucose-Capillary 70 - 99 mg/dL 768 (H)  3 units Novolog    (H): Data is abnormally high     Home DM Meds: Lantus  15 units daily Metformin  500 mg BID  Current Orders: Novolog  Sensitive Correction Scale/ SSI (0-9 units) TID AC   MD- Note pt takes Lantus  at home.  CBG 231 this AM.  Please consider starting Lantus  10 units daily (2/3 home dose)     --Will follow patient during hospitalization--  Adina Rudolpho Arrow RN, MSN, CDCES Diabetes Coordinator Inpatient Glycemic Control Team Team Pager: [phone] (8a-5p)

## 2024-01-20 NOTE — PMR Pre-admission (Signed)
 PMR Admission Coordinator Pre-Admission Assessment  Patient: John [name] is an 78 y.o., male MRN: [id number] DOB: [birth year] Height:   Weight:    Insurance Information HMO: ***    PPO: ***     PCP: ***     IPA: ***     80/20: ***     OTHER: *** PRIMARY: BCBS Medicare      Policy#: [id number]      Subscriber: patient CM Name: ***      Phone#: ***     Fax#: *** Pre-Cert#: ***      Employer: *** Benefits:  Phone #: ***     Name: *** Eustacio. Date: ***     Deduct: ***      Out of Pocket Max: ***      Life Max: *** CIR: ***      SNF: *** Outpatient: ***     Co-Pay: *** Home Health: ***      Co-Pay: *** DME: ***     Co-Pay: *** Providers: in-network SECONDARY:       Policy#:      Phone#:   Financial Counselor:       Phone#:   The Data processing manager" for patients in Inpatient Rehabilitation Facilities with attached "Privacy Act Statement-Health Care Records" was provided and verbally reviewed with: {CHL IP Patient Family WJ:[id number]}  Emergency Contact Information Contact Information     Name Relation Home Work Mobile   [name] Spouse [phone]  [phone]   John [name]   [phone]      Other Contacts   None on File     Current Medical History  Patient Admitting Diagnosis: CVA History of Present Illness: Pt is a 78 year old male with medical hx significant for: HTN, hyperlipidemai, DM II, h/o CVA, cognitive deficits, CAD, previous DVT, urinary retention with Foley catheter in place, PNA. Pt recently admitted to hospital from September 15-22 d/t intractable nausea and vomiting. Imaging showed basal ganglia lacunar infarcts. Pt discharged to [facility]. Pt discharged from [facility] on 01/16/24. Pt presented to [facility] on 01/16/24 d/t left-sided weakness. CT head negative for acute abnormalities. Chest x-ray negative. Abnormal urinalysis. Urine culture grew ESBL E.Coli. Pt started on Rocephin . Neurology consulted. Pt transferred to  [facility] on 01/18/24. MRI was positive for right pontine infarct with possible punctate petechial hemorrhage. 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 Therapy evaluations completed and CIR recommended d/t pt's deficits in functional mobility.  Complete NIHSS TOTAL: 7  Patient's medical record from [facility] has been reviewed by the rehabilitation admission coordinator and physician.  Past Medical History  Past Medical History:  Diagnosis Date   BPH (benign prostatic hyperplasia)    CTS (carpal tunnel syndrome)    Depression    Diabetes mellitus    Essential hypertension    GERD (gastroesophageal reflux disease)    History of breast cancer    Hypercholesteremia    Neuropathy    Stroke [facility])     Has the patient had major surgery during 100 days prior to admission? No  Family History   family history includes Cancer in his mother, paternal grandmother, and sister; Diabetes in his father; Lymphoma in his mother; Ovarian cancer in his sister; Stroke in his father.  Current Medications  Current Facility-Administered Medications:    0.9 %  sodium chloride  infusion, , Intravenous, Continuous, Drusilla Sabas RAMAN, MD, Last Rate: 75 mL/hr at 01/18/24 2201, New Bag at 01/18/24 2201  acetaminophen  (TYLENOL ) tablet 650 mg, 650 mg, Oral, Q6H PRN **OR** acetaminophen  (TYLENOL ) suppository 650 mg, 650 mg, Rectal, Q6H PRN, Lou, Claretta HERO, MD   aspirin  EC tablet 81 mg, 81 mg, Oral, Daily, Khaliqdina, Salman, MD, 81 mg at 01/20/24 9040   atorvastatin  (LIPITOR ) tablet 80 mg, 80 mg, Oral, QHS, Amponsah, Prosper M, MD, 80 mg at 01/19/24 2118   bisacodyl  (DULCOLAX) EC tablet 5 mg, 5 mg, Oral, Daily PRN, Lou Claretta HERO, MD   clopidogrel  (PLAVIX ) tablet 75 mg, 75 mg, Oral, Q breakfast, Lou Claretta HERO, MD, 75 mg at 01/20/24 9040   enoxaparin  (LOVENOX ) injection 40 mg, 40 mg, Subcutaneous, Q24H, Lou Claretta HERO, MD, 40 mg at 01/20/24 9040   Gerhardt's  butt cream, , Topical, BID, Drusilla, Sabas RAMAN, MD, Given at 01/20/24 1000   insulin  aspart (novoLOG ) injection 0-9 Units, 0-9 Units, Subcutaneous, TID WC, Amponsah, Prosper M, MD, 7 Units at 01/20/24 1205   insulin  glargine (LANTUS ) injection 5 Units, 5 Units, Subcutaneous, QHS, Lama, Sabas RAMAN, MD   ondansetron  (ZOFRAN ) tablet 4 mg, 4 mg, Oral, Q6H PRN **OR** ondansetron  (ZOFRAN ) injection 4 mg, 4 mg, Intravenous, Q6H PRN, Lou Claretta HERO, MD   pantoprazole  (PROTONIX ) EC tablet 40 mg, 40 mg, Oral, QHS, Amponsah, Prosper M, MD, 40 mg at 01/19/24 2118   senna-docusate (Senokot-S) tablet 1 tablet, 1 tablet, Oral, QHS PRN, Lou Claretta HERO, MD  Patients Current Diet:  Diet Order             Diet Heart Room service appropriate? Yes; Fluid consistency: Thin  Diet effective now                   Precautions / Restrictions Precautions Precautions: Fall Precaution/Restrictions Comments: left extremities weak Restrictions Weight Bearing Restrictions Per Provider Order: No Other Position/Activity Restrictions: L sided weakness   Has the patient had 2 or more falls or a fall with injury in the past year? Yes  Prior Activity Level Limited Community (1-2x/wk): MD appointments  Prior Functional Level Self Care: Did the patient need help bathing, dressing, using the toilet or eating? Needed some help  Indoor Mobility: Did the patient need assistance with walking from room to room (with or without device)? Independent  Stairs: Did the patient need assistance with internal or external stairs (with or without device)? Independent  Functional Cognition: Did the patient need help planning regular tasks such as shopping or remembering to take medications? Needed some help  Patient Information Are you of Hispanic, Latino/a,or Spanish origin?: A. No, not of Hispanic, Latino/a, or Spanish origin What is your race?: A. White Do you need or want an interpreter to communicate with a doctor or  health care staff?: 0. No  Patient's Response To:  Health Literacy and Transportation Is the patient able to respond to health literacy and transportation needs?: Yes Health Literacy - How often do you need to have someone help you when you read instructions, pamphlets, or other written material from your doctor or pharmacy?: Never In the past 12 months, has lack of transportation kept you from medical appointments or from getting medications?: No In the past 12 months, has lack of transportation kept you from meetings, work, or from getting things needed for daily living?: No  Home Assistive Devices / Equipment Home Equipment: Agricultural consultant (2 wheels), [place] - single point, BSC/3in1, Wheelchair - power  Prior Device Use: Indicate devices/aids used by the patient prior to current illness, exacerbation or injury? Walker  Current  Functional Level Cognition  Orientation Level: Oriented X4    Extremity Assessment (includes Sensation/Coordination)  Upper Extremity Assessment: Defer to OT evaluation RUE Deficits / Details: AROM WFL. Gross strength 4+/5. Digit opposition WFL LUE Deficits / Details: able to use LUE with bed mobility and sit to stands from recliner, improed grip and ROM at shoulder and elbow LUE Sensation: decreased light touch LUE Coordination: decreased fine motor, decreased gross motor  Lower Extremity Assessment: LLE deficits/detail RLE Deficits / Details: WFL LLE Deficits / Details: slow movements, decreased control LLE Sensation: decreased light touch LLE Coordination: decreased gross motor    ADLs  Overall ADL's : Needs assistance/impaired Eating/Feeding: Minimal assistance, Sitting Eating/Feeding Details (indicate cue type and reason): In supported sitting. Assist anticipated to be needed for cutting food, removing lids and opening containers/packets, given LUE weakness and impaired coordination. Grooming: Wash/dry hands, Wash/dry face, Minimal assistance,  Standing Grooming Details (indicate cue type and reason): standing at sink in [place] Upper Body Bathing: Sitting, Cueing for compensatory techniques, Minimal assistance, Moderate assistance Upper Body Bathing Details (indicate cue type and reason): In supported sitting. Lower Body Bathing: Maximal assistance, Sitting/lateral leans, Cueing for compensatory techniques Lower Body Bathing Details (indicate cue type and reason): In supported sitting. Upper Body Dressing : Moderate assistance, Sitting Lower Body Dressing: Cueing for compensatory techniques, Cueing for sequencing, Maximal assistance Lower Body Dressing Details (indicate cue type and reason): In supported sitting. Toilet Transfer: Maximal assistance, Cueing for sequencing, Cueing for safety, BSC/3in1, +2 for physical assistance Toileting- Clothing Manipulation and Hygiene: Total assistance Toileting - Clothing Manipulation Details (indicate cue type and reason): at bedside commode level, based on clinical judgement    Mobility  Overal bed mobility: Needs Assistance Bed Mobility: Rolling, Sidelying to Sit, Sit to Supine Rolling: Mod assist, Used rails Sidelying to sit: Mod assist, +2 for safety/equipment, +2 for physical assistance, Used rails Supine to sit: Max assist, HOB elevated, Used rails Sit to supine: Mod assist, +2 for physical assistance, +2 for safety/equipment General bed mobility comments: patient requiring mod assist +2 due to assistance needed with trunk and BLEs, education provided on rail use    Transfers  Overall transfer level: Needs assistance Equipment used: Ambulation equipment used Transfers: Sit to/from Stand Sit to Stand: Via lift equipment, Mod assist, +2 physical assistance, +2 safety/equipment, Min assist Transfer via Lift Equipment: [employer] transfer comment: sit to stand from EOB with mod assist +2 and able to stand from [employer] pads with min assist    Ambulation / Gait / Stairs / Wheelchair  Mobility  Ambulation/Gait General Gait Details: unable this date    Posture / Balance Dynamic Sitting Balance Sitting balance - Comments: MinA-ModA for L lateral lean, able to correct with increased time and assist Balance Overall balance assessment: Needs assistance Sitting-balance support: Feet supported, Bilateral upper extremity supported Sitting balance-Leahy Scale: Poor Sitting balance - Comments: MinA-ModA for L lateral lean, able to correct with increased time and assist Postural control: Left lateral lean Standing balance support: Reliant on assistive device for balance, Bilateral upper extremity supported Standing balance-Leahy Scale: Poor Standing balance comment: left lateral lean in standing, reliant on ModAx2    Special considerations/life events  Continuous Drip IV  ***, Skin Ecchymosis: sacrum/bilateral; Erythema/Redness: buttocks/bilateral; Petechiae: abdomen/bilateral; Stage 2 Pressure Injury/sacrum, Urethral Catheter and Diabetic management Novolog  0-9 units 3x daily with meals, Lantus  5 units daily at bedtime   Previous Home Environment (from acute therapy documentation) Living Arrangements: Spouse/significant other Available Help at Discharge: Family, Available  24 hours/day Type of Home: House Home Layout: Able to live on main level with bedroom/bathroom (has bed in living room) Alternate Level Stairs-Rails: Right, Left, Can reach both Alternate Level Stairs-Number of Steps: He reported sleeping in a recliner on the main level of the home. All bedrooms are upstairs Home Access: Stairs to enter Entrance Stairs-Rails: Can reach both (on stairs in back of the house) Entrance Stairs-Number of Steps: 4 Bathroom Shower/Tub: Health visitor: Standard Bathroom Accessibility: Yes How Accessible: Accessible via walker Additional Comments: pt was DC from SNF on day of adm 10/6  Discharge Living Setting Plans for Discharge Living Setting: Patient's  home Type of Home at Discharge: House Discharge Home Layout: Able to live on main level with bedroom/bathroom (has bed in living room) Discharge Home Access: Stairs to enter Entrance Stairs-Rails: Can reach both (on stairs in back) Entrance Stairs-Number of Steps: 4 Discharge Bathroom Shower/Tub: Walk-in shower Discharge Bathroom Toilet: Standard Discharge Bathroom Accessibility: Yes How Accessible: Accessible via walker Does the patient have any problems obtaining your medications?: No  Social/Family/Support Systems Anticipated Caregiver: Aidyn Kellis, wife Anticipated Caregiver's Contact Information: [phone] Caregiver Availability: 24/7 Discharge Plan Discussed with Primary Caregiver: Yes Is Caregiver In Agreement with Plan?: Yes Does Caregiver/Family have Issues with Lodging/Transportation while Pt is in Rehab?: No  Goals Patient/Family Goal for Rehab: *** Expected length of stay: *** Pt/Family Agrees to Admission and willing to participate: Yes Program Orientation Provided & Reviewed with Pt/Caregiver Including Roles  & Responsibilities: Yes  Decrease burden of Care through IP rehab admission: NA  Possible need for SNF placement upon discharge: Not anticipated  Patient Condition: {PATIENT'S CONDITION:22832}  Preadmission Screen Completed By:  Tinnie SHAUNNA Yvone Delayne, 01/20/2024 4:02 PM ______________________________________________________________________   Discussed status with Dr. PIERRETTE on *** at *** and received approval for admission today.  Admission Coordinator:  Tinnie SHAUNNA Yvone Delayne, CCC-SLP, time ***/Date ***   Assessment/Plan: Diagnosis: *** Does the need for close, 24 hr/day Medical supervision in concert with the patient's rehab needs make it unreasonable for this patient to be served in a less intensive setting? {yes_no_potentially:3041433} Co-Morbidities requiring supervision/potential complications: *** Due to {due un:6958565}, does the patient require  24 hr/day rehab nursing? {yes_no_potentially:3041433} Does the patient require coordinated care of a physician, rehab nurse, PT, OT, and SLP to address physical and functional deficits in the context of the above medical diagnosis(es)? {yes_no_potentially:3041433} Addressing deficits in the following areas: {deficits:3041436} Can the patient actively participate in an intensive therapy program of at least 3 hrs of therapy 5 days a week? {yes_no_potentially:3041433} The potential for patient to make measurable gains while on inpatient rehab is {potential:3041437} Anticipated functional outcomes upon discharge from inpatient rehab: {functional outcomes:304600100} PT, {functional outcomes:304600100} OT, {functional outcomes:304600100} SLP Estimated rehab length of stay to reach the above functional goals is: *** Anticipated discharge destination: {anticipated dc setting:21604} 10. Overall Rehab/Functional Prognosis: {potential:3041437}   MD Signature: ***

## 2024-01-20 NOTE — Progress Notes (Signed)
 Occupational Therapy Treatment Patient Details Name: John [name] MRN: [id number] DOB: [birth year] Today's Date: 01/20/2024   History of present illness 78 y.o. male presents to [facility] 01/16/24 with L sided weakness and inability to get out of a car. MRI showed acute R pontine infarct. PMH: hypertension, hyperlipidemia, type 2 diabetes mellitus, history of CVA, cognitive deficits, CAD, heart block s/p PPM, chronic HFrEF, provoked DVT, BPH, urinary retention with Foley catheter   OT comments  Education provided on bed mobility with patient requiring mod assist +2.  Patient demonstrating left lateral leaning while seated on EOB and used a mirror for feedback and self correcting. Patient able to stand in [place] with mod assist +2 and was moved to in front of sink to address grooming. Patient required min assist to perform grooming tasks with LUE used to support self while standing.  Patient will benefit from intensive inpatient follow-up therapy, >3 hours/day.  Acute OT to continue to follow to address established goals to facilitate DC to next venue of care.        If plan is discharge home, recommend the following:  A lot of help with bathing/dressing/bathroom;A lot of help with walking and/or transfers;Assistance with cooking/housework;Assist for transportation;Help with stairs or ramp for entrance;Supervision due to cognitive status;Direct supervision/assist for medications management;Direct supervision/assist for financial management   Equipment Recommendations  None recommended by OT    Recommendations for Other Services      Precautions / Restrictions Precautions Precautions: Fall Restrictions Weight Bearing Restrictions Per Provider Order: No       Mobility Bed Mobility Overal bed mobility: Needs Assistance Bed Mobility: Rolling, Sidelying to Sit, Sit to Supine Rolling: Mod assist, Used rails Sidelying to sit: Mod assist, +2 for safety/equipment, +2 for physical assistance, Used  rails   Sit to supine: Mod assist, +2 for physical assistance, +2 for safety/equipment   General bed mobility comments: patient requiring mod assist +2 due to assistance needed with trunk and BLEs, education provided on rail use    Transfers Overall transfer level: Needs assistance Equipment used: Ambulation equipment used Transfers: Sit to/from Stand Sit to Stand: Via lift equipment, Mod assist, +2 physical assistance, +2 safety/equipment, Min assist           General transfer comment: sit to stand from EOB with mod assist +2 and able to stand from [place] pads with min assist     Balance Overall balance assessment: Needs assistance Sitting-balance support: Feet supported, Bilateral upper extremity supported Sitting balance-Leahy Scale: Poor Sitting balance - Comments: MinA-ModA for L lateral lean, able to correct with increased time and assist Postural control: Left lateral lean Standing balance support: Reliant on assistive device for balance, Bilateral upper extremity supported Standing balance-Leahy Scale: Poor Standing balance comment: left lateral lean in standing, reliant on ModAx2                           ADL either performed or assessed with clinical judgement   ADL Overall ADL's : Needs assistance/impaired     Grooming: Wash/dry hands;Wash/dry face;Minimal assistance;Standing Grooming Details (indicate cue type and reason): standing at sink in [place]                                    Extremity/Trunk Assessment Upper Extremity Assessment LUE Deficits / Details: able to use LUE with bed mobility and sit to stands from recliner,  improed grip and ROM at shoulder and elbow LUE Sensation: decreased light touch LUE Coordination: decreased fine motor;decreased gross motor            Diplomatic Services operational officer Communication Communication: Impaired Factors Affecting Communication: Hearing impaired    Cognition Arousal: Alert Behavior During Therapy: Flat affect Cognition: History of cognitive impairments, No family/caregiver present to determine baseline                               Following commands: Impaired Following commands impaired: Follows one step commands inconsistently, Follows one step commands with increased time      Cueing   Cueing Techniques: Verbal cues, Tactile cues, Visual cues  Exercises      Shoulder Instructions       General Comments      Pertinent Vitals/ Pain       Pain Assessment Pain Assessment: No/denies pain  Home Living                                          Prior Functioning/Environment              Frequency  Min 2X/week        Progress Toward Goals  OT Goals(current goals can now be found in the care plan section)  Progress towards OT goals: Progressing toward goals  Acute Rehab OT Goals Patient Stated Goal: to get some sleep OT Goal Formulation: With patient Time For Goal Achievement: 01/31/24 Potential to Achieve Goals: Fair ADL Goals Pt Will Perform Eating: with supervision;with set-up;sitting Pt Will Perform Grooming: with set-up;with supervision;sitting;with adaptive equipment Pt Will Perform Lower Body Bathing: with supervision;with adaptive equipment;sit to/from stand Pt Will Perform Upper Body Dressing: with min assist;sitting Pt Will Perform Lower Body Dressing: with supervision;with adaptive equipment;sit to/from stand Pt Will Transfer to Toilet: with min assist;stand pivot transfer;bedside commode Pt Will Perform Tub/Shower Transfer: Shower transfer;with contact guard assist;ambulating;shower seat;rolling walker Additional ADL Goal #1: The pt will perform bed mobility with CGA, in prep for progressive ADL participation.  Plan      Co-evaluation    PT/OT/SLP Co-Evaluation/Treatment: Yes Reason for Co-Treatment: For patient/therapist safety;To address functional/ADL  transfers PT goals addressed during session: Mobility/safety with mobility;Proper use of DME;Balance OT goals addressed during session: ADL's and self-care      AM-PAC OT 6 Clicks Daily Activity     Outcome Measure   Help from another person eating meals?: A Little Help from another person taking care of personal grooming?: A Lot Help from another person toileting, which includes using toliet, bedpan, or urinal?: Total Help from another person bathing (including washing, rinsing, drying)?: A Lot Help from another person to put on and taking off regular upper body clothing?: A Lot Help from another person to put on and taking off regular lower body clothing?: A Lot 6 Click Score: 12    End of Session Equipment Utilized During Treatment: Gait belt;Other (comment) Laurent)  OT Visit Diagnosis: Unsteadiness on feet (R26.81);Other abnormalities of gait and mobility (R26.89);Muscle weakness (generalized) (M62.81);Feeding difficulties (R63.3);Other symptoms and signs involving cognitive function   Activity Tolerance Patient tolerated treatment well   Patient Left in bed;with call bell/phone within reach;with bed alarm set   Nurse Communication Mobility status  Time: 9169-9143 OT Time Calculation (min): 26 min  Charges: OT General Charges $OT Visit: 1 Visit OT Treatments $Self Care/Home Management : 8-22 mins  Dick Laine, OTA Acute Rehabilitation Services  Office [phone]   Jeb LITTIE Laine 01/20/2024, 11:30 AM

## 2024-01-21 DIAGNOSIS — Z8673 Personal history of transient ischemic attack (TIA), and cerebral infarction without residual deficits: Secondary | ICD-10-CM | POA: Diagnosis not present

## 2024-01-21 DIAGNOSIS — R531 Weakness: Secondary | ICD-10-CM | POA: Diagnosis not present

## 2024-01-21 DIAGNOSIS — N3000 Acute cystitis without hematuria: Secondary | ICD-10-CM | POA: Diagnosis not present

## 2024-01-21 DIAGNOSIS — Z950 Presence of cardiac pacemaker: Secondary | ICD-10-CM | POA: Diagnosis not present

## 2024-01-21 LAB — GLUCOSE, CAPILLARY
Glucose-Capillary: 212 mg/dL — ABNORMAL HIGH (ref 70–99)
Glucose-Capillary: 195 mg/dL — ABNORMAL HIGH (ref 70–99)
Glucose-Capillary: 228 mg/dL — ABNORMAL HIGH (ref 70–99)
Glucose-Capillary: 217 mg/dL — ABNORMAL HIGH (ref 70–99)
Glucose-Capillary: 327 mg/dL — ABNORMAL HIGH (ref 70–99)

## 2024-01-21 NOTE — Progress Notes (Signed)
 [facility]  PROGRESS NOTE  John [name] FMW:[id number] DOB: Aug 27, 1945 DOA: 01/16/2024 PCP: John Mabel Mt, DO   Brief HPI:    78 y.o. male with medical history significant for hypertension, hyperlipidemia, type 2 diabetes mellitus, history of CVA, cognitive deficits, CAD, heart block s/p PPM chronic HFrEF, provoked DVT no longer anticoagulated, BPH, urinary retention with Foley catheter, and recent admissions with UTI, bacteremia, pneumonia, nausea and vomiting and discharged to SNF now presenting with left-sided weakness.     Assessment/Plan:   Acute pontine infarct - MRI brain showed acute right pontine infarct -Neurology consulted; likely lacunar infarct -CT head and neck showed severe right posterior cerebral artery stenosis - Recently had TEE on 12/08/2023 which showed EF of 30 to 35% with LV global hypokinesis -A1c 8.5 -Neurology recommends to continue aspirin  and Plavix  for 3 weeks and then aspirin  alone -PT evaluation obtained, plan to go to CIR - Atorvastatin  80 mg daily   Acute cystitis Urine culture grew ESBL E. Coli - Cefepime  was discontinued and patient started on meropenem -He is afebrile, vitals are stable. -Meropenem was discontinued, patient was given 1 dose of fosfomycin.    T2DM - Last A1c 7.5% in April 2025 - SSI with meals, CBG monitoring -CBGs elevated - Continue Lantus  5 units subcu daily     HTN/HFrEF - Last TTE on 11/2023 showed EF 30-35%, LV global hypokinesis and mild protruding plaque involving the aortic root - Patient euvolemic on exam - Discontinued losartan  for permissive hypertension.   Hx of CVA - History of multiple lacunar infarcts - Plavix     History of urinary retention/ BPH - Continue Foley catheter-- will need outpatient follow up   Heart block s/p PPM - has pacemaker    CAD - Continue atorvastatin  -It seems patient was not taking Plavix  before he came to the hospital    HLD -atorvastatin    GERD -  Continue Protonix     Medications     aspirin  EC  81 mg Oral Daily   atorvastatin   80 mg Oral QHS   Chlorhexidine  Gluconate Cloth  6 each Topical Daily   clopidogrel   75 mg Oral Q breakfast   enoxaparin  (LOVENOX ) injection  40 mg Subcutaneous Q24H   Gerhardt's butt cream   Topical BID   insulin  aspart  0-9 Units Subcutaneous TID WC   insulin  glargine  5 Units Subcutaneous QHS   pantoprazole   40 mg Oral QHS     Data Reviewed:   CBG:  Recent Labs  Lab 01/20/24 1651 01/20/24 2123 01/21/24 0610 01/21/24 0844 01/21/24 1249  GLUCAP 241* 260* 212* 195* 228*    SpO2: 99 %    Vitals:   01/21/24 0350 01/21/24 0840 01/21/24 0841 01/21/24 1251  BP: (!) 160/68 (!) 148/68 (!) 148/68 (!) 145/65  Pulse: 62 64 64 72  Resp: 16 18 14 18   Temp: 98.2 F (36.8 C) 97.8 F (36.6 C) 97.8 F (36.6 C) 98.1 F (36.7 C)  TempSrc: Oral Oral Oral Oral  SpO2: 98% 99% 99% 99%      Data Reviewed:  Basic Metabolic Panel: Recent Labs  Lab 01/16/24 1659 01/17/24 0522 01/18/24 0417  NA 138 141 140  K 4.1 3.6 3.8  CL 104 109 106  CO2 21* 22 24  GLUCOSE 164* 143* 185*  BUN 13 11 10   CREATININE 0.74 0.55* 0.63  CALCIUM  9.4 8.2* 8.9    CBC: Recent Labs  Lab 01/16/24 1659 01/17/24 0522 01/18/24 0417  WBC 8.5 8.1 7.8  NEUTROABS 5.9  --   --   HGB 10.0* 11.9* 12.4*  HCT 32.1* 36.2* 38.1*  MCV 92.2 89.2 88.4  PLT 209 230 250    LFT Recent Labs  Lab 01/16/24 1659  AST 27  ALT 20  ALKPHOS 105  BILITOT 1.2  PROT 5.7*  ALBUMIN 3.5     Antibiotics: Anti-infectives (From admission, onward)    Start     Dose/Rate Route Frequency Ordered Stop   01/20/24 1045  fosfomycin (MONUROL) packet 3 g        3 g Oral  Once 01/20/24 0948 01/20/24 1157   01/19/24 1800  meropenem (MERREM) 1 g in sodium chloride  0.9 % 100 mL IVPB  Status:  Discontinued        1 g 200 mL/hr over 30 Minutes Intravenous Every 8 hours 01/19/24 1629 01/20/24 0948   01/17/24 2100  cefTRIAXone  (ROCEPHIN ) 1  g in sodium chloride  0.9 % 100 mL IVPB  Status:  Discontinued        1 g 200 mL/hr over 30 Minutes Intravenous Every 24 hours 01/16/24 2355 01/19/24 1625   01/16/24 2045  cefTRIAXone  (ROCEPHIN ) 1 g in sodium chloride  0.9 % 100 mL IVPB        1 g 200 mL/hr over 30 Minutes Intravenous  Once 01/16/24 2041 01/16/24 2219        DVT prophylaxis: Lovenox   Code Status: DNR  Family Communication: Discussed with patient's wife on phone   CONSULTS neurology   Subjective   Denies any complaints.   Objective    Physical Examination:  General-appears in no acute distress Heart-S1-S2, regular, no murmur auscultated Lungs-clear to auscultation bilaterally, no wheezing or crackles auscultated Abdomen-soft, nontender, no organomegaly Extremities-no edema in the lower extremities Neuro-alert, oriented x3, left-sided weakness noted   Status is: Inpatient:      Wound 01/18/24 1846 Pressure Injury Sacrum Stage 2 -  Partial thickness loss of dermis presenting as a shallow open injury with a red, pink wound bed without slough. (Active)        John [name]   [facility] If 7PM-7AM, please contact night-coverage at [email or web address], Office  [phone]   01/21/2024, 3:33 PM  LOS: 0 days

## 2024-01-22 DIAGNOSIS — Z950 Presence of cardiac pacemaker: Secondary | ICD-10-CM | POA: Diagnosis not present

## 2024-01-22 DIAGNOSIS — Z8673 Personal history of transient ischemic attack (TIA), and cerebral infarction without residual deficits: Secondary | ICD-10-CM | POA: Diagnosis not present

## 2024-01-22 DIAGNOSIS — R531 Weakness: Secondary | ICD-10-CM | POA: Diagnosis not present

## 2024-01-22 DIAGNOSIS — N3000 Acute cystitis without hematuria: Secondary | ICD-10-CM | POA: Diagnosis not present

## 2024-01-22 LAB — GLUCOSE, CAPILLARY
Glucose-Capillary: 198 mg/dL — ABNORMAL HIGH (ref 70–99)
Glucose-Capillary: 312 mg/dL — ABNORMAL HIGH (ref 70–99)
Glucose-Capillary: 317 mg/dL — ABNORMAL HIGH (ref 70–99)
Glucose-Capillary: 221 mg/dL — ABNORMAL HIGH (ref 70–99)

## 2024-01-22 NOTE — Progress Notes (Signed)
 [facility]  PROGRESS NOTE  John [name] FMW:[id number] DOB: 1945-05-03 DOA: 01/16/2024 PCP: [name], DO   Brief HPI:    78 y.o. male with medical history significant for hypertension, hyperlipidemia, type 2 diabetes mellitus, history of CVA, cognitive deficits, CAD, heart block s/p PPM chronic HFrEF, provoked DVT no longer anticoagulated, BPH, urinary retention with Foley catheter, and recent admissions with UTI, bacteremia, pneumonia, nausea and vomiting and discharged to SNF now presenting with left-sided weakness.     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 Coli - Cefepime  was discontinued and patient started on meropenem -He is afebrile, vitals are stable. -Meropenem was discontinued, patient was given 1 dose of fosfomycin.    T2DM - Last A1c 7.5% in April 2025 - SSI with meals, CBG monitoring -CBGs  - Continue Lantus  5 units subcu daily     HTN/HFrEF - Last TTE on 11/2023 showed EF 30-35%, LV global hypokinesis and mild protruding plaque involving the aortic root - Patient euvolemic on exam - Discontinued losartan  for permissive hypertension.   Hx of CVA - History of multiple lacunar infarcts Started aspirin  and Plavix  as above    History of urinary retention/ BPH - Continue Foley catheter-- will need outpatient follow up   Heart block s/p PPM - has pacemaker    CAD - Continue atorvastatin  -It seems patient was not taking Plavix  before he came to the hospital     HLD -atorvastatin    GERD - Continue Protonix     Medications     aspirin  EC  81 mg Oral Daily   atorvastatin   80 mg Oral QHS   Chlorhexidine  Gluconate Cloth  6 each Topical Daily   clopidogrel   75 mg Oral Q breakfast   enoxaparin  (LOVENOX ) injection  40 mg Subcutaneous Q24H   John [name]   Topical BID   insulin  aspart  0-9 Units Subcutaneous TID WC   insulin  glargine  5 Units Subcutaneous QHS   pantoprazole   40 mg Oral QHS     Data Reviewed:   CBG:  Recent Labs  Lab 01/21/24 0844 01/21/24 1249 01/21/24 1636 01/21/24 2130 01/22/24 0621  GLUCAP 195* 228* 217* 327* 198*    SpO2: 96 %    Vitals:   01/21/24 0841 01/21/24 1251 01/21/24 1633 01/21/24 2029  BP: (!) 148/68 (!) 145/65 (!) 146/66 132/73  Pulse: 64 72 (!) 59 79  Resp: 14 18 18    Temp: 97.8 F (36.6 C) 98.1 F (36.7 C) 97.7 F (36.5 C) 98.5 F (36.9 C)  TempSrc: Oral Oral Oral Oral  SpO2: 99% 99% 100% 96%      Data Reviewed:  Basic Metabolic Panel: Recent Labs  Lab 01/16/24 1659 01/17/24 0522 01/18/24 0417  NA 138 141 140  K 4.1 3.6 3.8  CL 104 109 106  CO2 21* 22 24  GLUCOSE 164* 143* 185*  BUN 13 11 10   CREATININE 0.74 0.55* 0.63  CALCIUM  9.4 8.2* 8.9    CBC: Recent Labs  Lab 01/16/24 1659 01/17/24 0522 01/18/24 0417  WBC 8.5 8.1 7.8  NEUTROABS 5.9  --   --   HGB 10.0* 11.9* 12.4*  HCT 32.1* 36.2* 38.1*  MCV 92.2 89.2 88.4  PLT 209 230 250    LFT Recent Labs  Lab 01/16/24 1659  AST 27  ALT 20  ALKPHOS 105  BILITOT 1.2  PROT 5.7*  ALBUMIN 3.5     Antibiotics: Anti-infectives (From admission, onward)    Start     Dose/Rate Route Frequency Ordered Stop   01/20/24 1045  fosfomycin (MONUROL) packet 3 g        3 g Oral  Once 01/20/24 0948 01/20/24 1157   01/19/24 1800  meropenem (MERREM) 1 g in sodium chloride  0.9 % 100 mL IVPB  Status:  Discontinued        1 g 200 mL/hr over 30 Minutes Intravenous Every 8 hours 01/19/24 1629 01/20/24 0948   01/17/24  2100  cefTRIAXone  (ROCEPHIN ) 1 g in sodium chloride  0.9 % 100 mL IVPB  Status:  Discontinued        1 g 200 mL/hr over 30 Minutes Intravenous Every 24 hours 01/16/24 2355 01/19/24 1625   01/16/24 2045  cefTRIAXone  (ROCEPHIN ) 1 g in sodium chloride  0.9 % 100 mL IVPB        1 g 200 mL/hr over 30 Minutes Intravenous  Once 01/16/24 2041 01/16/24 2219        DVT prophylaxis: Lovenox   Code Status: DNR  Family Communication: Discussed with patient's wife on phone   CONSULTS neurology   Subjective   Patient seen and examined, awaiting bed at CIR.  No new complaints  Objective    Physical Examination:  General-appears in no acute distress Heart-S1-S2, regular, no murmur auscultated Lungs-clear to auscultation bilaterally, no wheezing or crackles auscultated Abdomen-soft, nontender, no organomegaly Extremities-no edema in the lower extremities Neuro-alert, oriented x3, left-sided weakness    Status is: Inpatient:      Wound 01/18/24 1846 Pressure Injury Sacrum Stage 2 -  Partial thickness loss of dermis presenting as a shallow open injury with a red, pink wound bed without slough. (Active)        John [name]   [facility] If 7PM-7AM, please contact night-coverage at [email or web address], Office  [phone]   01/22/2024, 7:52 AM  LOS: 0 days

## 2024-01-22 NOTE — Progress Notes (Signed)
 Patient refused vitals.

## 2024-01-22 NOTE — Progress Notes (Signed)
 RN offered to transferred patient to the chair with assistive device and help from other staff. Family in room. Patient declined to be up in chair at again.

## 2024-01-22 NOTE — Plan of Care (Signed)
   Problem: Fluid Volume: Goal: Ability to maintain a balanced intake and output will improve Outcome: Progressing   Problem: Nutritional: Goal: Maintenance of adequate nutrition will improve Outcome: Progressing   Problem: Tissue Perfusion: Goal: Adequacy of tissue perfusion will improve Outcome: Progressing

## 2024-01-22 NOTE — Progress Notes (Signed)
 Per record, pt hasn't has any BM since 9/20 prior to admission. Bowel sound active, pt denied any pain or discomfort upon abdomen auscultation. [name] expressed that he had a large BM 2 days ago. RN offered to use assisted device to transferred him into chair from bed, he declined at this time.

## 2024-01-23 DIAGNOSIS — R531 Weakness: Secondary | ICD-10-CM | POA: Diagnosis not present

## 2024-01-23 DIAGNOSIS — Z8673 Personal history of transient ischemic attack (TIA), and cerebral infarction without residual deficits: Secondary | ICD-10-CM | POA: Diagnosis not present

## 2024-01-23 DIAGNOSIS — I6350 Cerebral infarction due to unspecified occlusion or stenosis of unspecified cerebral artery: Secondary | ICD-10-CM

## 2024-01-23 DIAGNOSIS — Z950 Presence of cardiac pacemaker: Secondary | ICD-10-CM | POA: Diagnosis not present

## 2024-01-23 DIAGNOSIS — N3000 Acute cystitis without hematuria: Secondary | ICD-10-CM | POA: Diagnosis not present

## 2024-01-23 LAB — GLUCOSE, CAPILLARY
Glucose-Capillary: 186 mg/dL — ABNORMAL HIGH (ref 70–99)
Glucose-Capillary: 272 mg/dL — ABNORMAL HIGH (ref 70–99)
Glucose-Capillary: 268 mg/dL — ABNORMAL HIGH (ref 70–99)
Glucose-Capillary: 356 mg/dL — ABNORMAL HIGH (ref 70–99)
Glucose-Capillary: 341 mg/dL — ABNORMAL HIGH (ref 70–99)

## 2024-01-23 MED ORDER — LOSARTAN POTASSIUM 25 MG PO TABS
25.0000 mg | ORAL_TABLET | Freq: Every day | ORAL | Status: DC
  Administered 2024-01-23 – 2024-01-24 (×2): 25 mg via ORAL
  Filled 2024-01-23 (×2): qty 1

## 2024-01-23 NOTE — Progress Notes (Signed)
 [facility]  PROGRESS NOTE  John [name] FMW:[id number] DOB: 09-12-1945 DOA: 01/16/2024 PCP: [name], DO   Brief HPI:    78 y.o. male with medical history significant for hypertension, hyperlipidemia, type 2 diabetes mellitus, history of CVA, cognitive deficits, CAD, heart block s/p PPM chronic HFrEF, provoked DVT no longer anticoagulated, BPH, urinary retention with Foley catheter, and recent admissions with UTI, bacteremia, pneumonia, nausea and vomiting and discharged to SNF now presenting with left-sided weakness.     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 Coli - Cefepime  was discontinued and patient started on meropenem -He is afebrile, vitals are stable. -Meropenem was discontinued, patient was given 1 dose of fosfomycin.    T2DM - Last A1c 7.5% in April 2025 - SSI with meals, CBG monitoring -CBGs  - Continue Lantus  5 units subcu daily     HTN/HFrEF - Last TTE on 11/2023 showed EF 30-35%, LV global hypokinesis and mild protruding plaque involving the aortic root - Patient euvolemic on exam - Will restart losartan  25 mg daily   Hx of CVA - History of multiple lacunar infarcts Started aspirin  and Plavix  as above    History of urinary retention/ BPH - Continue Foley catheter-- will need outpatient follow up   Heart block s/p PPM - has pacemaker    CAD - Continue atorvastatin  -It seems patient was not taking Plavix  before he came to the hospital    HLD -atorvastatin     GERD - Continue Protonix     Medications     aspirin  EC  81 mg Oral Daily   atorvastatin   80 mg Oral QHS   Chlorhexidine  Gluconate Cloth  6 each Topical Daily   clopidogrel   75 mg Oral Q breakfast   enoxaparin  (LOVENOX ) injection  40 mg Subcutaneous Q24H   Gerhardt's butt cream   Topical BID   insulin  aspart  0-9 Units Subcutaneous TID WC   insulin  glargine  5 Units Subcutaneous QHS   pantoprazole   40 mg Oral QHS     Data Reviewed:   CBG:  Recent Labs  Lab 01/22/24 0621 01/22/24 1143 01/22/24 1628 01/22/24 2143 01/23/24 0628  GLUCAP 198* 312* 317* 221* 186*    SpO2: 96 %    Vitals:   01/22/24 0805 01/22/24 1146 01/22/24 1627 01/22/24 2200  BP: (!) 148/82 (!) 143/64 (!) 144/59 (!) 146/59  Pulse: 64 66 69 62  Resp: 17 18    Temp: 98.1 F (36.7 C) 97.9 F (36.6 C) (!) 97.5 F (36.4 C) 97.7 F (36.5 C)  TempSrc: Oral Oral Oral Oral  SpO2: 98% 98% 99% 96%      Data Reviewed:  Basic Metabolic Panel: Recent Labs  Lab 01/16/24 1659 01/17/24 0522 01/18/24 0417  NA 138 141 140  K 4.1 3.6 3.8  CL 104 109 106  CO2 21* 22 24  GLUCOSE 164* 143* 185*  BUN 13 11 10   CREATININE 0.74 0.55* 0.63  CALCIUM  9.4 8.2* 8.9    CBC: Recent Labs  Lab 01/16/24 1659 01/17/24 0522  01/18/24 0417  WBC 8.5 8.1 7.8  NEUTROABS 5.9  --   --   HGB 10.0* 11.9* 12.4*  HCT 32.1* 36.2* 38.1*  MCV 92.2 89.2 88.4  PLT 209 230 250    LFT Recent Labs  Lab 01/16/24 1659  AST 27  ALT 20  ALKPHOS 105  BILITOT 1.2  PROT 5.7*  ALBUMIN 3.5     Antibiotics: Anti-infectives (From admission, onward)    Start     Dose/Rate Route Frequency Ordered Stop   01/20/24 1045  fosfomycin (MONUROL) packet 3 g        3 g Oral  Once 01/20/24 0948 01/20/24 1157   01/19/24 1800  meropenem (MERREM) 1 g in sodium chloride  0.9 % 100 mL IVPB  Status:  Discontinued        1 g 200 mL/hr over 30 Minutes Intravenous Every 8 hours 01/19/24 1629 01/20/24 0948   01/17/24 2100  cefTRIAXone   (ROCEPHIN ) 1 g in sodium chloride  0.9 % 100 mL IVPB  Status:  Discontinued        1 g 200 mL/hr over 30 Minutes Intravenous Every 24 hours 01/16/24 2355 01/19/24 1625   01/16/24 2045  cefTRIAXone  (ROCEPHIN ) 1 g in sodium chloride  0.9 % 100 mL IVPB        1 g 200 mL/hr over 30 Minutes Intravenous  Once 01/16/24 2041 01/16/24 2219        DVT prophylaxis: Lovenox   Code Status: DNR  Family Communication: Discussed with patient's wife on phone   CONSULTS neurology   Subjective   Patient seen and examined.  Denies any complaints  Objective    Physical Examination:  General-appears in no acute distress Heart-S1-S2, regular, no murmur auscultated Lungs-clear to auscultation bilaterally, no wheezing or crackles auscultated Abdomen-soft, nontender, no organomegaly Extremities-no edema in the lower extremities Neuro-alert, oriented x3, left-sided weakness   Status is: Inpatient:      Wound 01/18/24 1846 Pressure Injury Sacrum Stage 2 -  Partial thickness loss of dermis presenting as a shallow open injury with a red, pink wound bed without slough. (Active)        John [name]   [facility] If 7PM-7AM, please contact night-coverage at [email or web address], Office  [phone]   01/23/2024, 7:40 AM  LOS: 0 days

## 2024-01-23 NOTE — Plan of Care (Signed)
  Problem: Education: Goal: Ability to describe self-care measures that may prevent or decrease complications (Diabetes Survival Skills Education) will improve Outcome: Progressing   Problem: Coping: Goal: Ability to adjust to condition or change in health will improve Outcome: Progressing   Problem: Health Behavior/Discharge Planning: Goal: Ability to identify and utilize available resources and services will improve Outcome: Progressing

## 2024-01-23 NOTE — Inpatient Diabetes Management (Signed)
 Inpatient Diabetes Program Recommendations  AACE/ADA: New Consensus Statement on Inpatient Glycemic Control   Target Ranges:  Prepandial:   less than 140 mg/dL      Peak postprandial:   less than 180 mg/dL (1-2 hours)      Critically ill patients:  140 - 180 mg/dL    Latest Reference Range & Units 01/22/24 06:21 01/22/24 11:43 01/22/24 16:28 01/22/24 21:43 01/23/24 06:28  Glucose-Capillary 70 - 99 mg/dL 801 (H) 687 (H) 682 (H) 221 (H) 186 (H)    Review of Glycemic Control  Diabetes history: DM2 Outpatient Diabetes medications: Lantus  15 units daily, Metformin  500 mg BID Current orders for Inpatient glycemic control: Lantus  5 units at bedtime, Novolog  0-9 units TID with meals  Inpatient Diabetes Program Recommendations:    Insulin : Please consider ordering Novolog  4 units TID with meals for meal coverage if patient eats at least 50% of meals.  Thanks, Earnie Gainer, RN, MSN, CDCES Diabetes Coordinator Inpatient Diabetes Program [phone] (Team Pager from 8am to 5pm)

## 2024-01-23 NOTE — Progress Notes (Signed)
 Occupational Therapy Treatment Patient Details Name: John [name] MRN: [id number] DOB: 1945/09/07 Today's Date: 01/23/2024   History of present illness 78 y.o. male presents to [facility]/16/24 with L sided weakness and inability to get out of a car. MRI showed acute R pontine infarct. PMH: hypertension, hyperlipidemia, type 2 diabetes mellitus, history of CVA, cognitive deficits, CAD, heart block s/p PPM, chronic HFrEF, provoked DVT, BPH, urinary retention with Foley catheter   OT comments  Pt greeted in supine, agreeable for OT visit. HoH impacting communication. Remains with flat affect, but participatory. Pt with inappropriate comments towards OT (profanities) during treatment, able to be redirected. Pt was mod A to transfer to EOB (exited R) and needed up to mod A to sustain midline sitting. Tends with posterior lean but balance improves with visual target, progressing to bouts of CGA for sitting EOB. Engaged in laundry folding task cueing for bilateral integration of BUE and functional reaching with good tolerance. Pt setup with lunch tray upon OT departure. Will continue to follow.      If plan is discharge home, recommend the following:  A lot of help with bathing/dressing/bathroom;A lot of help with walking and/or transfers;Assistance with cooking/housework;Assist for transportation;Help with stairs or ramp for entrance;Supervision due to cognitive status;Direct supervision/assist for medications management;Direct supervision/assist for financial management   Equipment Recommendations  Other (comment) (defer)    Recommendations for Other Services      Precautions / Restrictions Precautions Precautions: Fall Recall of Precautions/Restrictions: Impaired Precaution/Restrictions Comments: LHB weakness Restrictions Weight Bearing Restrictions Per Provider Order: No       Mobility Bed Mobility Overal bed mobility: Needs Assistance Bed Mobility: Supine to Sit, Sit to Supine      Supine to sit: Mod assist, HOB elevated, Used rails Sit to supine: Max assist, HOB elevated, Used rails   General bed mobility comments: Assist for trunk elevation & LLE mgmt towards EOB    Transfers                   General transfer comment: not assessed - focus of session on EOB/sitting balance this date     Balance Overall balance assessment: Needs assistance Sitting-balance support: Feet supported, Bilateral upper extremity supported Sitting balance-Leahy Scale: Poor Sitting balance - Comments: bouts of CGA-mod A to sustain sitting in midline at EOB, pt often leaning posteriorly, corrects well with visual target; balance improving with prompting and BUE support via tray table Postural control: Posterior lean                                 ADL either performed or assessed with clinical judgement   ADL Overall ADL's : Needs assistance/impaired Eating/Feeding: Minimal assistance;Bed level Eating/Feeding Details (indicate cue type and reason): beverage and container/condiment mgmt; feeds self using R hand                                        Extremity/Trunk Assessment Upper Extremity Assessment LUE Deficits / Details: prefers to use RUE, reduced dexterity and general strength in entire LUE            Vision       Perception     Praxis     Communication Communication Communication: Impaired Factors Affecting Communication: Hearing impaired   Cognition Arousal: Alert Behavior During Therapy: Flat affect Cognition: History  of cognitive impairments, No family/caregiver present to determine baseline   Orientation impairments: Time, Place   Memory impairment (select all impairments): Short-term memory, Declarative long-term memory Attention impairment (select first level of impairment): Sustained attention Executive functioning impairment (select all impairments): Organization, Reasoning, Problem solving OT - Cognition  Comments: HoH may be impacting cognition as well; difficulty with dual tasking                 Following commands: Impaired Following commands impaired: Follows one step commands inconsistently, Follows one step commands with increased time      Cueing   Cueing Techniques: Verbal cues, Tactile cues, Visual cues  Exercises Other Exercises Other Exercises: functional reaching EOB 1x10 shin slides Other Exercises: folding washcloths at EOB - prompted for bilateral integration use of BUE while maintaining sitting balance    Shoulder Instructions       General Comments      Pertinent Vitals/ Pain       Pain Assessment Pain Assessment: No/denies pain Pain Score: 0-No pain  Home Living                                          Prior Functioning/Environment              Frequency  Min 2X/week        Progress Toward Goals  OT Goals(current goals can now be found in the care plan section)  Progress towards OT goals: Progressing toward goals     Plan      Co-evaluation                 AM-PAC OT 6 Clicks Daily Activity     Outcome Measure   Help from another person eating meals?: A Little Help from another person taking care of personal grooming?: A Little Help from another person toileting, which includes using toliet, bedpan, or urinal?: Total Help from another person bathing (including washing, rinsing, drying)?: A Lot Help from another person to put on and taking off regular upper body clothing?: A Lot Help from another person to put on and taking off regular lower body clothing?: A Lot 6 Click Score: 13    End of Session    OT Visit Diagnosis: Unsteadiness on feet (R26.81);Other abnormalities of gait and mobility (R26.89);Muscle weakness (generalized) (M62.81);Feeding difficulties (R63.3);Other symptoms and signs involving cognitive function   Activity Tolerance Patient tolerated treatment well   Patient Left in bed;with  call bell/phone within reach;with bed alarm set   Nurse Communication          Time: 8777-8754 OT Time Calculation (min): 23 min  Charges: OT General Charges $OT Visit: 1 Visit OT Treatments $Therapeutic Activity: 23-37 mins  Etoy Mcdonnell D., MSOT, OTR/L Acute Rehabilitation Services [phone] Secure Chat Preferred  Rikki Milch 01/23/2024, 1:26 PM

## 2024-01-23 NOTE — TOC Progression Note (Signed)
 Transition of Care [facility]) - Progression Note    Patient Details  Name: John [name] MRN: [id number] Date of Birth: 01/15/1946  Transition of Care [facility]) CM/SW Contact  Andrez JULIANNA George, RN Phone Number: 01/23/2024, 11:29 AM  Clinical Narrative:     CIR to start insurance auth today for a rehab admission. IP Care management following.  Expected Discharge Plan: IP Rehab Facility Barriers to Discharge: Continued Medical Work up               Expected Discharge Plan and Services   Discharge Planning Services: CM Consult Post Acute Care Choice: IP Rehab Living arrangements for the past 2 months: Single Family Home                                       Social Drivers of Health (SDOH) Interventions SDOH Screenings   Food Insecurity: No Food Insecurity (01/19/2024)  Housing: Low Risk  (01/19/2024)  Transportation Needs: No Transportation Needs (01/19/2024)  Utilities: Not At Risk (01/19/2024)  Alcohol Screen: Low Risk  (01/27/2023)  Depression (PHQ2-9): Low Risk  (01/28/2023)  Financial Resource Strain: Low Risk  (10/03/2023)  Physical Activity: Inactive (10/03/2023)  Social Connections: Unknown (01/19/2024)  Recent Concern: Social Connections - Socially Isolated (12/05/2023)  Stress: No Stress Concern Present (10/03/2023)  Tobacco Use: Low Risk  (01/16/2024)    Readmission Risk Interventions    12/06/2023    4:03 PM 05/11/2023   12:52 PM 04/22/2023   12:12 PM  Readmission Risk Prevention Plan  Medication Screening   Complete  Transportation Screening Complete Complete Complete  PCP or Specialist Appt within 3-5 Days  Complete   HRI or Home Care Consult  Complete   Social Work Consult for Recovery Care Planning/Counseling  Complete   Palliative Care Screening  Not Applicable   Medication Review Oceanographer) Complete Complete   HRI or Home Care Consult Complete    SW Recovery Care/Counseling Consult Complete    Palliative Care Screening Not Applicable     Skilled Nursing Facility Not Applicable

## 2024-01-23 NOTE — Plan of Care (Signed)
   Problem: Education: Goal: Ability to describe self-care measures that may prevent or decrease complications (Diabetes Survival Skills Education) will improve Outcome: Progressing   Problem: Coping: Goal: Ability to adjust to condition or change in health will improve Outcome: Progressing   Problem: Tissue Perfusion: Goal: Adequacy of tissue perfusion will improve Outcome: Progressing

## 2024-01-23 NOTE — Consult Note (Signed)
 Physical Medicine and Rehabilitation Consult Reason for Consult:left hp Referring Physician: [place]   HPI: John [name] is a 78 y.o. 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  CTA of the head and neck demonstrated severe posterior cerebral artery stenosis.  MRI of the brain revealed acute right pontine infarct.  Neurology felt this was a lacunar infarct and recommended to continue aspirin  and Plavix  for 3 weeks then changing to aspirin  alone.  Patient with acute cystitis with urine culture growing out ESBL E. coli for which the patient was started on meropenem and eventually transition to fosfomycin.  Patient continues with Foley catheter.  He has been working with therapies and is min assist for sit to stand transfers but does not appear to have walked yet.  He has been min assist for very basic ADLs.  Patient was modified independent with rolling walker prior to his prior hospitalization and transfer to SNF.  He lives at home with his wife in a two-level home with 4 steps to enter.  Apparently they can stay on the first floor.    Home: Home Living Family/patient expects to be discharged to:: Private residence Living Arrangements: Spouse/significant other Available Help at Discharge: Family, Available 24 hours/day Type of Home: House Home Access: Stairs to enter [employer] of Steps: 4 Entrance Stairs-Rails: Can reach both (on stairs in back of the house) Home Layout: Able to live on main level with bedroom/bathroom (has bed in living room) Alternate Level Stairs-Number of Steps: He reported sleeping in a recliner on the main level of the home. All bedrooms are upstairs Alternate Level Stairs-Rails: Right, Left, Can reach both Bathroom Shower/Tub: Health visitor: Standard Bathroom  Accessibility: Yes Home Equipment: Agricultural consultant (2 wheels), [employer] - single point, BSC/3in1, Wheelchair - power Additional Comments: pt was DC from SNF on day of adm 10/6  Functional History: Prior Function Prior Level of Function : Independent/Modified Independent Mobility Comments: was ambulating with RW x 20' prior to DC to [facility] 9/16 after T12 compr fx, unsure distance while in SNF ADLs Comments: unsure Functional Status:  Mobility: Bed Mobility Overal bed mobility: Needs Assistance Bed Mobility: Rolling, Sidelying to Sit, Sit to Supine Rolling: Mod assist, Used rails Sidelying to sit: Mod assist, +2 for safety/equipment, +2 for physical assistance, Used rails Supine to sit: Max assist, HOB elevated, Used rails Sit to supine: Mod assist, +2 for physical assistance, +2 for safety/equipment General bed mobility comments: patient requiring mod assist +2 due to assistance needed with trunk and BLEs, education provided on rail use Transfers Overall transfer level: Needs assistance Equipment used: Ambulation equipment used Transfers: Sit to/from Stand Sit to Stand: Via lift equipment, Mod assist, +2 physical assistance, +2 safety/equipment, Min assist Transfer via Lift Equipment: [employer] transfer comment: sit to stand from EOB with mod assist +2 and able to stand from [place] pads with min assist Ambulation/Gait General Gait Details: unable this date    ADL: ADL Overall ADL's : Needs assistance/impaired Eating/Feeding: Minimal assistance, Sitting Eating/Feeding Details (indicate cue type and reason): In supported sitting. Assist anticipated to be needed for cutting food, removing lids and opening containers/packets, given LUE weakness and impaired coordination. Grooming: Wash/dry hands, Wash/dry face, Minimal assistance, Standing Grooming Details (indicate cue type and reason): standing at sink in [place] Upper Body Bathing: Sitting,  Cueing for compensatory techniques, Minimal  assistance, Moderate assistance Upper Body Bathing Details (indicate cue type and reason): In supported sitting. Lower Body Bathing: Maximal assistance, Sitting/lateral leans, Cueing for compensatory techniques Lower Body Bathing Details (indicate cue type and reason): In supported sitting. Upper Body Dressing : Moderate assistance, Sitting Lower Body Dressing: Cueing for compensatory techniques, Cueing for sequencing, Maximal assistance Lower Body Dressing Details (indicate cue type and reason): In supported sitting. Toilet Transfer: Maximal assistance, Cueing for sequencing, Cueing for safety, BSC/3in1, +2 for physical assistance Toileting- Clothing Manipulation and Hygiene: Total assistance Toileting - Clothing Manipulation Details (indicate cue type and reason): at bedside commode level, based on clinical judgement  Cognition: Cognition Orientation Level: Oriented X4 Cognition Arousal: Alert Behavior During Therapy: Flat affect   Review of Systems  Constitutional:  Negative for fever.  HENT:  Negative for hearing loss.   Eyes:  Negative for blurred vision.  Respiratory: Negative.    Cardiovascular: Negative.   Gastrointestinal: Negative.   Genitourinary: Negative.   Musculoskeletal: Negative.   Skin:  Negative for rash.  Neurological:  Positive for focal weakness and weakness.  Psychiatric/Behavioral: Negative.     Past Medical History:  Diagnosis Date   BPH (benign prostatic hyperplasia)    CTS (carpal tunnel syndrome)    Depression    Diabetes mellitus    Essential hypertension    GERD (gastroesophageal reflux disease)    History of breast cancer    Hypercholesteremia    Neuropathy    Stroke [facility])    Past Surgical History:  Procedure Laterality Date   CATARACT EXTRACTION, BILATERAL  01/18/2017   CORONARY STENT INTERVENTION N/A 08/10/2023   Procedure: CORONARY STENT INTERVENTION;  Surgeon: Darron Deatrice LABOR, MD;  Location: [facility] INVASIVE CV LAB;  Service:  Cardiovascular;  Laterality: N/A;   LEFT HEART CATH AND CORONARY ANGIOGRAPHY N/A 08/10/2023   Procedure: LEFT HEART CATH AND CORONARY ANGIOGRAPHY;  Surgeon: Darron Deatrice LABOR, MD;  Location: [facility] INVASIVE CV LAB;  Service: Cardiovascular;  Laterality: N/A;   LITHOTRIPSY     MASTECTOMY Right 05/02/2018   PACEMAKER IMPLANT N/A 05/13/2023   Procedure: PACEMAKER IMPLANT;  Surgeon: Kennyth Chew, MD;  Location: [facility] INVASIVE CV LAB;  Service: Cardiovascular;  Laterality: N/A;   TEMPORARY PACEMAKER N/A 05/11/2023   Procedure: TEMPORARY PACEMAKER;  Surgeon: [place], Peter M, MD;  Location: [facility] INVASIVE CV LAB;  Service: Cardiovascular;  Laterality: N/A;   TONSILLECTOMY AND ADENOIDECTOMY     TRANSESOPHAGEAL ECHOCARDIOGRAM (CATH LAB) N/A 12/08/2023   Procedure: TRANSESOPHAGEAL ECHOCARDIOGRAM;  Surgeon: Raford Riggs, MD;  Location: [facility] INVASIVE CV LAB;  Service: Cardiovascular;  Laterality: N/A;   Family History  Problem Relation Age of Onset   Lymphoma Mother    Cancer Mother    Diabetes Father    Stroke Father    Ovarian cancer Sister    Cancer Sister    Cancer Paternal Grandmother    Social History:  reports that he has never smoked. He has never used smokeless tobacco. He reports that he does not drink alcohol and does not use drugs. Allergies:  Allergies  Allergen Reactions   Ramipril Anaphylaxis   Adhesive [Tape] Rash   Other Diarrhea    Severe intolerance to Chemotherapy in the past.   Sertraline Other (See Comments)    Extreme headaches   Medications Prior to Admission  Medication Sig Dispense Refill   acetaminophen  (TYLENOL ) 325 MG tablet Take 2 tablets (650 mg total) by mouth every 6 (six) hours as needed for mild  pain (pain score 1-3) or fever (or Fever >/= 101). (Patient taking differently: Take 650 mg by mouth every 8 (eight) hours as needed for mild pain (pain score 1-3) or fever (or Fever >/= 101).)     Ascorbic Acid  (VITAMIN C) 1000 MG tablet Take 1,000 mg by mouth 2 (two)  times a week.     atorvastatin  (LIPITOR ) 80 MG tablet Take 1 tablet (80 mg total) by mouth at bedtime. 90 tablet 1   bisacodyl  (DULCOLAX) 10 MG suppository Place 10 mg rectally daily as needed for moderate constipation.     clopidogrel  (PLAVIX ) 75 MG tablet Take 1 tablet (75 mg total) by mouth daily with breakfast. 90 tablet 0   cyanocobalamin  1000 MCG tablet Take 1 tablet (1,000 mcg total) by mouth daily. 100 tablet 0   escitalopram  (LEXAPRO ) 10 MG tablet Take 1 tablet (10 mg total) by mouth daily. 90 tablet 1   ferrous sulfate  (FEROSUL) 325 (65 FE) MG tablet Take 1 tablet (325 mg total) by mouth daily with breakfast. 100 tablet 0   finasteride  (PROSCAR ) 5 MG tablet Take 1 tablet (5 mg total) by mouth daily. *Need appointment for future refills.* 30 tablet 0   insulin  glargine (LANTUS ) 100 UNIT/ML injection Inject 0.15 mLs (15 Units total) into the skin daily. 10 mL 11   losartan  (COZAAR ) 25 MG tablet Take 1 tablet (25 mg total) by mouth daily. 60 tablet 0   magnesium  hydroxide (MILK OF MAGNESIA) 400 MG/5ML suspension Take 30 mLs by mouth daily as needed for mild constipation.     metFORMIN  (GLUCOPHAGE ) 500 MG tablet Take 1 tablet (500 mg total) by mouth 2 (two) times daily with a meal. 60 tablet 0   Multiple Vitamin (MULTIVITAMIN) tablet Take 1 tablet by mouth 2 (two) times a week.     ondansetron  (ZOFRAN -ODT) 4 MG disintegrating tablet Take 1 tablet (4 mg total) by mouth every 8 (eight) hours as needed for nausea or vomiting. 20 tablet 0   pantoprazole  (PROTONIX ) 40 MG tablet Take 1 tablet (40 mg total) by mouth at bedtime. 30 tablet 0   polyethylene glycol (MIRALAX  / GLYCOLAX ) 17 g packet Take 17 g by mouth daily.     PRESCRIPTION MEDICATION Place 1 suppository rectally daily as needed (Constipation).     senna-docusate (SENOKOT-S) 8.6-50 MG tablet Take 1 tablet by mouth 2 (two) times daily. 100 tablet 0   clotrimazole -betamethasone  (LOTRISONE ) cream Apply 1 Application topically daily.  (Patient not taking: Reported on 01/17/2024) 30 g 0   hyoscyamine  (LEVSIN  SL) 0.125 MG SL tablet Place 1 tablet (0.125 mg total) under the tongue every 4 (four) hours as needed for bladder spasms. (Patient not taking: Reported on 01/17/2024) 20 tablet 0     Blood pressure (!) 144/57, pulse 65, temperature 97.9 F (36.6 C), temperature source Oral, resp. rate 17, SpO2 98%. Physical Exam Constitutional:      General: He is not in acute distress. HENT:     Head: Normocephalic.     Right Ear: External ear normal.     Nose: Nose normal.  Eyes:     Conjunctiva/sclera: Conjunctivae normal.     Pupils: Pupils are equal, round, and reactive to light.  Pulmonary:     Effort: Pulmonary effort is normal.  Abdominal:     Palpations: Abdomen is soft.  Genitourinary:    Comments: foley Musculoskeletal:        General: Normal range of motion.     Cervical back: Normal range of  motion.  Skin:    General: Skin is warm.  Neurological:     Mental Status: He is alert.     Comments: Alert and oriented x 3. Normal insight and awareness. Intact Memory. Normal language and speech. Cranial nerve exam unremarkable. MMT: RUE and RLE grossly 4 to 4+/5. LUE 3/5 delt, triceps, biceps, wrist, HI 3-. LLE 4- prox to 4/5 distal. Sensory exam normal for light touch and pain in all 4 limbs. No limb ataxia or cerebellar signs. No abnormal tone appreciated.  [name]    Psychiatric:        Mood and Affect: Mood normal.     Results for orders placed or performed during the hospital encounter of 01/16/24 (from the past 24 hours)  Glucose, capillary     Status: Abnormal   Collection Time: 01/22/24 11:43 AM  Result Value Ref Range   Glucose-Capillary 312 (H) 70 - 99 mg/dL  Glucose, capillary     Status: Abnormal   Collection Time: 01/22/24  4:28 PM  Result Value Ref Range   Glucose-Capillary 317 (H) 70 - 99 mg/dL  Glucose, capillary     Status: Abnormal   Collection Time: 01/22/24  9:43 PM  Result Value Ref Range    Glucose-Capillary 221 (H) 70 - 99 mg/dL  Glucose, capillary     Status: Abnormal   Collection Time: 01/23/24  6:28 AM  Result Value Ref Range   Glucose-Capillary 186 (H) 70 - 99 mg/dL   No results found.  Assessment/Plan: Diagnosis: 78 year old male with right pontine infarct Does the need for close, 24 hr/day medical supervision in concert with the patient's rehab needs make it unreasonable for this patient to be served in a less intensive setting? Yes Co-Morbidities requiring supervision/potential complications:  - Acute cystitis -Type 2 diabetes -BPH with urine retention -Hypertension -CAD with history of CHF Due to bladder management, bowel management, safety, skin/wound care, disease management, medication administration, pain management, and patient education, does the patient require 24 hr/day rehab nursing? Yes Does the patient require coordinated care of a physician, rehab nurse, therapy disciplines of PT, OT, ?SLP to address physical and functional deficits in the context of the above medical diagnosis(es)? Yes Addressing deficits in the following areas: balance, endurance, locomotion, strength, transferring, bowel/bladder control, bathing, dressing, feeding, grooming, toileting, cognition, and psychosocial support Can the patient actively participate in an intensive therapy program of at least 3 hrs of therapy per day at least 5 days per week? Yes The potential for patient to make measurable gains while on inpatient rehab is good Anticipated functional outcomes upon discharge from inpatient rehab are supervision  with PT, modified independent and supervision with OT, ?mod I with SLP. Estimated rehab length of stay to reach the above functional goals is: 9-12 days Anticipated discharge destination: Home Overall Rehab/Functional Prognosis: excellent  POST ACUTE RECOMMENDATIONS: This patient's condition is appropriate for continued rehabilitative care in the following setting:  CIR Patient has agreed to participate in recommended program. Yes Note that insurance prior authorization may be required for reimbursement for recommended care.  Comment: Wife will need to be able to provide at least supervision at discharge. Rehab Admissions Coordinator to follow up.     I have personally performed a face to face diagnostic evaluation of this patient. Additionally, I have examined the patient's medical record including any pertinent labs and radiographic images.    Thanks,  Arthea ONEIDA Gunther, MD 01/23/2024

## 2024-01-23 NOTE — Progress Notes (Signed)
 Physical Therapy Treatment Patient Details Name: John [name] MRN: [id number] DOB: November 29, 1945 Today's Date: 01/23/2024   History of Present Illness 78 y.o. male presents to [facility] 01/16/24 with L sided weakness and inability to get out of a car. MRI showed acute R pontine infarct. PMH: hypertension, hyperlipidemia, type 2 diabetes mellitus, history of CVA, cognitive deficits, CAD, heart block s/p PPM, chronic HFrEF, provoked DVT, BPH, urinary retention with Foley catheter    PT Comments  Pt agreeable to participate with encouragement provided. Pt requiring mod-maxA + 2 for bed mobility and transfers to standing. Utilized [employer] for external feedback to work on midline posture as pt with left lateral lean in sitting and standing. Pt with episode of bowel incontinence, so returned to supine to roll for posterior peri care and then worked on supine exercises for LLE strengthening. Pt with fair activity tolerance throughout. Patient will benefit from intensive inpatient follow-up therapy, >3 hours/day in order to address deficits and maximize functional mobility.     If plan is discharge home, recommend the following: A lot of help with walking and/or transfers;A lot of help with bathing/dressing/bathroom;Assistance with cooking/housework;Help with stairs or ramp for entrance;Assist for transportation;Supervision due to cognitive status;Direct supervision/assist for medications management;Direct supervision/assist for financial management   Can travel by private vehicle     No  Equipment Recommendations  None recommended by PT    Recommendations for Other Services       Precautions / Restrictions Precautions Precautions: Fall Recall of Precautions/Restrictions: Impaired Precaution/Restrictions Comments: L hemiparesis Restrictions Weight Bearing Restrictions Per Provider Order: No     Mobility  Bed Mobility Overal bed mobility: Needs Assistance Bed Mobility: Supine to Sit, Sit to Supine,  Rolling Rolling: Min assist   Supine to sit: Mod assist, HOB elevated, Used rails, +2 for physical assistance Sit to supine: HOB elevated, Used rails, Mod assist, +2 for physical assistance   General bed mobility comments: Verbal cues for initiating, use of bed pad to pivot hips, guidance for locating rail and trunk assist to upright. BLE elevation back into bed. MinA for rolling to R/L for peri care    Transfers Overall transfer level: Needs assistance Equipment used: Rolling walker (2 wheels) Transfers: Sit to/from Stand Sit to Stand: Mod assist, +2 physical assistance           General transfer comment: ModA + 2 to power up to stand, instruction for pushing from the bed, however, pt pushing up from RW with PT stabilizing. Left lateral lean    Ambulation/Gait               General Gait Details: unable this date   Stairs             Wheelchair Mobility     Tilt Bed    Modified Rankin (Stroke Patients Only) Modified Rankin (Stroke Patients Only) Pre-Morbid Rankin Score: Moderately severe disability Modified Rankin: Severe disability     Balance Overall balance assessment: Needs assistance Sitting-balance support: Feet supported, Bilateral upper extremity supported Sitting balance-Leahy Scale: Poor Sitting balance - Comments: Requiring CGA-minA, left lateral lean, achieves midline briefly with use of mirror for external feedbak Postural control: Left lateral lean Standing balance support: Bilateral upper extremity supported Standing balance-Leahy Scale: Poor Standing balance comment: left lateral lean in standing, reliant on ModAx2                            Communication Communication Communication: Impaired Factors  Affecting Communication: Hearing impaired  Cognition Arousal: Alert Behavior During Therapy: Flat affect   PT - Cognitive impairments: Orientation, Awareness, Memory, Attention, Initiation, Sequencing, Problem solving,  Safety/Judgement   Orientation impairments: Situation                   PT - Cognition Comments: Pt not oriented to day of week, A&O to month/year and place with increased time to respond. Pt with difficulty verbalizing basic needs and symptoms, follows 1 step commands inconsistently with increased time. Often uses humor to mask deficits seemingly. Following commands: Impaired Following commands impaired: Follows one step commands inconsistently, Follows one step commands with increased time    Cueing Cueing Techniques: Verbal cues, Tactile cues, Visual cues  Exercises General Exercises - Lower Extremity Heel Slides: AROM, Left, 5 reps, Supine Other Exercises Other Exercises: Supine: bridging x 5 with LLE support    General Comments        Pertinent Vitals/Pain Pain Assessment Pain Assessment: Faces Faces Pain Scale: No hurt    Home Living                          Prior Function            PT Goals (current goals can now be found in the care plan section) Acute Rehab PT Goals Patient Stated Goal: I have no goals. PT Goal Formulation: Patient unable to participate in goal setting Time For Goal Achievement: 02/01/24 Potential to Achieve Goals: Fair Progress towards PT goals: Progressing toward goals    Frequency    Min 3X/week      PT Plan      Co-evaluation              AM-PAC PT 6 Clicks Mobility   Outcome Measure  Help needed turning from your back to your side while in a flat bed without using bedrails?: A Little Help needed moving from lying on your back to sitting on the side of a flat bed without using bedrails?: A Lot Help needed moving to and from a bed to a chair (including a wheelchair)?: Total Help needed standing up from a chair using your arms (e.g., wheelchair or bedside chair)?: Total Help needed to walk in hospital room?: Total Help needed climbing 3-5 steps with a railing? : Total 6 Click Score: 9    End of  Session Equipment Utilized During Treatment: Gait belt Activity Tolerance: Patient tolerated treatment well Patient left: in bed;with call bell/phone within reach;with bed alarm set Nurse Communication: Mobility status PT Visit Diagnosis: Unsteadiness on feet (R26.81);Muscle weakness (generalized) (M62.81);Other abnormalities of gait and mobility (R26.89);Hemiplegia and hemiparesis Hemiplegia - Right/Left: Left Hemiplegia - dominant/non-dominant: Non-dominant Hemiplegia - caused by: Cerebral infarction     Time: 8666-8596 PT Time Calculation (min) (ACUTE ONLY): 30 min  Charges:    $Therapeutic Activity: 23-37 mins PT General Charges $$ ACUTE PT VISIT: 1 Visit                     Aleck Daring, PT, DPT Acute Rehabilitation Services Office [phone]    Aleck ONEIDA Daring 01/23/2024, 2:59 PM

## 2024-01-24 ENCOUNTER — Other Ambulatory Visit: Payer: Self-pay

## 2024-01-24 ENCOUNTER — Encounter (HOSPITAL_COMMUNITY): Payer: Self-pay | Admitting: Physical Medicine and Rehabilitation

## 2024-01-24 ENCOUNTER — Inpatient Hospital Stay (HOSPITAL_COMMUNITY)
Admission: AD | Admit: 2024-01-24 | Discharge: 2024-01-28 | DRG: 057 | Disposition: A | Discharge status: Other Acute Inpatient Hospital | Source: Intra-hospital | Attending: Physical Medicine and Rehabilitation | Admitting: Physical Medicine and Rehabilitation

## 2024-01-24 DIAGNOSIS — N3000 Acute cystitis without hematuria: Secondary | ICD-10-CM | POA: Diagnosis present

## 2024-01-24 DIAGNOSIS — Z79899 Other long term (current) drug therapy: Secondary | ICD-10-CM

## 2024-01-24 DIAGNOSIS — I959 Hypotension, unspecified: Secondary | ICD-10-CM | POA: Diagnosis not present

## 2024-01-24 DIAGNOSIS — I69319 Unspecified symptoms and signs involving cognitive functions following cerebral infarction: Secondary | ICD-10-CM

## 2024-01-24 DIAGNOSIS — B9620 Unspecified Escherichia coli [E. coli] as the cause of diseases classified elsewhere: Secondary | ICD-10-CM | POA: Diagnosis not present

## 2024-01-24 DIAGNOSIS — Z7984 Long term (current) use of oral hypoglycemic drugs: Secondary | ICD-10-CM | POA: Diagnosis not present

## 2024-01-24 DIAGNOSIS — E7800 Pure hypercholesterolemia, unspecified: Secondary | ICD-10-CM | POA: Diagnosis not present

## 2024-01-24 DIAGNOSIS — R531 Weakness: Secondary | ICD-10-CM | POA: Diagnosis not present

## 2024-01-24 DIAGNOSIS — Z66 Do not resuscitate: Secondary | ICD-10-CM | POA: Diagnosis present

## 2024-01-24 DIAGNOSIS — Z950 Presence of cardiac pacemaker: Secondary | ICD-10-CM | POA: Diagnosis not present

## 2024-01-24 DIAGNOSIS — Z9861 Coronary angioplasty status: Secondary | ICD-10-CM | POA: Diagnosis not present

## 2024-01-24 DIAGNOSIS — R4189 Other symptoms and signs involving cognitive functions and awareness: Secondary | ICD-10-CM | POA: Diagnosis not present

## 2024-01-24 DIAGNOSIS — E860 Dehydration: Secondary | ICD-10-CM | POA: Diagnosis not present

## 2024-01-24 DIAGNOSIS — I2510 Atherosclerotic heart disease of native coronary artery without angina pectoris: Secondary | ICD-10-CM | POA: Diagnosis not present

## 2024-01-24 DIAGNOSIS — Z823 Family history of stroke: Secondary | ICD-10-CM

## 2024-01-24 DIAGNOSIS — R627 Adult failure to thrive: Secondary | ICD-10-CM | POA: Diagnosis not present

## 2024-01-24 DIAGNOSIS — R404 Transient alteration of awareness: Secondary | ICD-10-CM | POA: Diagnosis present

## 2024-01-24 DIAGNOSIS — R338 Other retention of urine: Secondary | ICD-10-CM | POA: Diagnosis present

## 2024-01-24 DIAGNOSIS — K219 Gastro-esophageal reflux disease without esophagitis: Secondary | ICD-10-CM | POA: Diagnosis present

## 2024-01-24 DIAGNOSIS — I5022 Chronic systolic (congestive) heart failure: Secondary | ICD-10-CM | POA: Diagnosis not present

## 2024-01-24 DIAGNOSIS — E1165 Type 2 diabetes mellitus with hyperglycemia: Secondary | ICD-10-CM | POA: Diagnosis present

## 2024-01-24 DIAGNOSIS — E1140 Type 2 diabetes mellitus with diabetic neuropathy, unspecified: Secondary | ICD-10-CM | POA: Diagnosis present

## 2024-01-24 DIAGNOSIS — N401 Enlarged prostate with lower urinary tract symptoms: Secondary | ICD-10-CM | POA: Diagnosis not present

## 2024-01-24 DIAGNOSIS — E119 Type 2 diabetes mellitus without complications: Secondary | ICD-10-CM | POA: Diagnosis not present

## 2024-01-24 DIAGNOSIS — I69398 Other sequelae of cerebral infarction: Secondary | ICD-10-CM | POA: Diagnosis not present

## 2024-01-24 DIAGNOSIS — Z8041 Family history of malignant neoplasm of ovary: Secondary | ICD-10-CM

## 2024-01-24 DIAGNOSIS — Z8673 Personal history of transient ischemic attack (TIA), and cerebral infarction without residual deficits: Secondary | ICD-10-CM | POA: Diagnosis not present

## 2024-01-24 DIAGNOSIS — E872 Acidosis, unspecified: Secondary | ICD-10-CM | POA: Diagnosis not present

## 2024-01-24 DIAGNOSIS — I110 Hypertensive heart disease with heart failure: Secondary | ICD-10-CM | POA: Diagnosis present

## 2024-01-24 DIAGNOSIS — R7989 Other specified abnormal findings of blood chemistry: Secondary | ICD-10-CM | POA: Diagnosis present

## 2024-01-24 DIAGNOSIS — N320 Bladder-neck obstruction: Secondary | ICD-10-CM | POA: Diagnosis not present

## 2024-01-24 DIAGNOSIS — I6381 Other cerebral infarction due to occlusion or stenosis of small artery: Secondary | ICD-10-CM | POA: Diagnosis not present

## 2024-01-24 DIAGNOSIS — I69354 Hemiplegia and hemiparesis following cerebral infarction affecting left non-dominant side: Secondary | ICD-10-CM | POA: Diagnosis not present

## 2024-01-24 DIAGNOSIS — Z9011 Acquired absence of right breast and nipple: Secondary | ICD-10-CM

## 2024-01-24 DIAGNOSIS — Z807 Family history of other malignant neoplasms of lymphoid, hematopoietic and related tissues: Secondary | ICD-10-CM

## 2024-01-24 DIAGNOSIS — T83518A Infection and inflammatory reaction due to other urinary catheter, initial encounter: Secondary | ICD-10-CM | POA: Diagnosis present

## 2024-01-24 DIAGNOSIS — G9340 Encephalopathy, unspecified: Secondary | ICD-10-CM | POA: Diagnosis not present

## 2024-01-24 DIAGNOSIS — R4182 Altered mental status, unspecified: Secondary | ICD-10-CM | POA: Diagnosis not present

## 2024-01-24 DIAGNOSIS — I6350 Cerebral infarction due to unspecified occlusion or stenosis of unspecified cerebral artery: Principal | ICD-10-CM | POA: Diagnosis present

## 2024-01-24 DIAGNOSIS — Z955 Presence of coronary angioplasty implant and graft: Secondary | ICD-10-CM

## 2024-01-24 DIAGNOSIS — Z888 Allergy status to other drugs, medicaments and biological substances status: Secondary | ICD-10-CM

## 2024-01-24 DIAGNOSIS — I10 Essential (primary) hypertension: Secondary | ICD-10-CM | POA: Diagnosis not present

## 2024-01-24 DIAGNOSIS — Z9841 Cataract extraction status, right eye: Secondary | ICD-10-CM

## 2024-01-24 DIAGNOSIS — R2689 Other abnormalities of gait and mobility: Secondary | ICD-10-CM | POA: Diagnosis present

## 2024-01-24 DIAGNOSIS — Y731 Therapeutic (nonsurgical) and rehabilitative gastroenterology and urology devices associated with adverse incidents: Secondary | ICD-10-CM | POA: Diagnosis present

## 2024-01-24 DIAGNOSIS — Z833 Family history of diabetes mellitus: Secondary | ICD-10-CM | POA: Diagnosis not present

## 2024-01-24 DIAGNOSIS — Z9221 Personal history of antineoplastic chemotherapy: Secondary | ICD-10-CM

## 2024-01-24 DIAGNOSIS — I5020 Unspecified systolic (congestive) heart failure: Secondary | ICD-10-CM | POA: Diagnosis not present

## 2024-01-24 DIAGNOSIS — Z794 Long term (current) use of insulin: Secondary | ICD-10-CM | POA: Diagnosis not present

## 2024-01-24 DIAGNOSIS — Z7982 Long term (current) use of aspirin: Secondary | ICD-10-CM | POA: Diagnosis not present

## 2024-01-24 DIAGNOSIS — A499 Bacterial infection, unspecified: Secondary | ICD-10-CM | POA: Diagnosis present

## 2024-01-24 DIAGNOSIS — R569 Unspecified convulsions: Secondary | ICD-10-CM | POA: Diagnosis not present

## 2024-01-24 DIAGNOSIS — E785 Hyperlipidemia, unspecified: Secondary | ICD-10-CM | POA: Diagnosis not present

## 2024-01-24 DIAGNOSIS — N390 Urinary tract infection, site not specified: Secondary | ICD-10-CM | POA: Diagnosis not present

## 2024-01-24 DIAGNOSIS — Z9842 Cataract extraction status, left eye: Secondary | ICD-10-CM

## 2024-01-24 DIAGNOSIS — G8194 Hemiplegia, unspecified affecting left nondominant side: Secondary | ICD-10-CM | POA: Diagnosis not present

## 2024-01-24 DIAGNOSIS — I4510 Unspecified right bundle-branch block: Secondary | ICD-10-CM | POA: Diagnosis present

## 2024-01-24 DIAGNOSIS — Z7902 Long term (current) use of antithrombotics/antiplatelets: Secondary | ICD-10-CM

## 2024-01-24 DIAGNOSIS — Z515 Encounter for palliative care: Secondary | ICD-10-CM | POA: Diagnosis not present

## 2024-01-24 LAB — GLUCOSE, CAPILLARY
Glucose-Capillary: 289 mg/dL — ABNORMAL HIGH (ref 70–99)
Glucose-Capillary: 446 mg/dL — ABNORMAL HIGH (ref 70–99)
Glucose-Capillary: 214 mg/dL — ABNORMAL HIGH (ref 70–99)
Glucose-Capillary: 165 mg/dL — ABNORMAL HIGH (ref 70–99)

## 2024-01-24 LAB — GLUCOSE, RANDOM: Glucose, Bld: 396 mg/dL — ABNORMAL HIGH (ref 70–99)

## 2024-01-24 MED ORDER — ATORVASTATIN CALCIUM 80 MG PO TABS
80.0000 mg | ORAL_TABLET | Freq: Every day | ORAL | Status: DC
  Administered 2024-01-24 – 2024-01-27 (×3): 80 mg via ORAL
  Filled 2024-01-24 (×3): qty 1

## 2024-01-24 MED ORDER — CLOPIDOGREL BISULFATE 75 MG PO TABS: 75.0000 mg | ORAL_TABLET | Freq: Every day | ORAL | Status: DC

## 2024-01-24 MED ORDER — FLEET ENEMA RE ENEM: 1.0000 | ENEMA | Freq: Once | RECTAL | Status: DC | PRN

## 2024-01-24 MED ORDER — INSULIN ASPART 100 UNIT/ML IJ SOLN: 0.0000 [IU] | Freq: Three times a day (TID) | INTRAMUSCULAR | Status: DC

## 2024-01-24 MED ORDER — INSULIN GLARGINE 100 UNIT/ML ~~LOC~~ SOLN
5.0000 [IU] | Freq: Two times a day (BID) | SUBCUTANEOUS | Status: DC
  Administered 2024-01-24: 5 [IU] via SUBCUTANEOUS
  Filled 2024-01-24 (×2): qty 0.05

## 2024-01-24 MED ORDER — ACETAMINOPHEN 325 MG PO TABS: 325.0000 mg | ORAL_TABLET | ORAL | Status: DC | PRN

## 2024-01-24 MED ORDER — ZINC SULFATE 220 (50 ZN) MG PO CAPS
220.0000 mg | ORAL_CAPSULE | Freq: Every day | ORAL | Status: AC
Start: 2024-01-24 — End: 2024-02-07
  Administered 2024-01-24 – 2024-01-27 (×3): 220 mg via ORAL
  Filled 2024-01-24 (×3): qty 1

## 2024-01-24 MED ORDER — ASPIRIN 81 MG PO TBEC
81.0000 mg | DELAYED_RELEASE_TABLET | Freq: Every day | ORAL | Status: DC
  Administered 2024-01-25 – 2024-01-28 (×4): 81 mg via ORAL
  Filled 2024-01-24 (×4): qty 1

## 2024-01-24 MED ORDER — INSULIN ASPART 100 UNIT/ML IJ SOLN
3.0000 [IU] | Freq: Three times a day (TID) | INTRAMUSCULAR | Status: DC
  Administered 2024-01-24: 3 [IU] via SUBCUTANEOUS

## 2024-01-24 MED ORDER — INSULIN ASPART 100 UNIT/ML IJ SOLN: 3.0000 [IU] | Freq: Three times a day (TID) | INTRAMUSCULAR | Status: DC

## 2024-01-24 MED ORDER — CHLORHEXIDINE GLUCONATE CLOTH 2 % EX PADS: 6.0000 | MEDICATED_PAD | Freq: Every day | CUTANEOUS | Status: DC

## 2024-01-24 MED ORDER — INSULIN ASPART 100 UNIT/ML IJ SOLN
3.0000 [IU] | Freq: Three times a day (TID) | INTRAMUSCULAR | Status: DC
  Administered 2024-01-24 – 2024-01-28 (×7): 3 [IU] via SUBCUTANEOUS

## 2024-01-24 MED ORDER — MELATONIN 3 MG PO TABS
6.0000 mg | ORAL_TABLET | Freq: Every evening | ORAL | Status: DC | PRN
  Filled 2024-01-24: qty 2

## 2024-01-24 MED ORDER — ENOXAPARIN SODIUM 40 MG/0.4ML IJ SOSY
40.0000 mg | PREFILLED_SYRINGE | INTRAMUSCULAR | Status: DC
  Administered 2024-01-25 – 2024-01-28 (×4): 40 mg via SUBCUTANEOUS
  Filled 2024-01-24 (×4): qty 0.4

## 2024-01-24 MED ORDER — ESCITALOPRAM OXALATE 10 MG PO TABS
10.0000 mg | ORAL_TABLET | Freq: Every day | ORAL | Status: DC
  Administered 2024-01-25 – 2024-01-28 (×4): 10 mg via ORAL
  Filled 2024-01-24 (×4): qty 1

## 2024-01-24 MED ORDER — GLUCERNA SHAKE PO LIQD
237.0000 mL | Freq: Two times a day (BID) | ORAL | Status: DC
  Administered 2024-01-26 – 2024-01-28 (×2): 237 mL via ORAL

## 2024-01-24 MED ORDER — ONDANSETRON HCL 4 MG PO TABS
4.0000 mg | ORAL_TABLET | Freq: Four times a day (QID) | ORAL | Status: DC | PRN
  Filled 2024-01-24: qty 1

## 2024-01-24 MED ORDER — INSULIN ASPART 100 UNIT/ML IJ SOLN
0.0000 [IU] | Freq: Three times a day (TID) | INTRAMUSCULAR | Status: DC
  Administered 2024-01-24 – 2024-01-25 (×2): 3 [IU] via SUBCUTANEOUS
  Administered 2024-01-25: 7 [IU] via SUBCUTANEOUS
  Administered 2024-01-25: 2 [IU] via SUBCUTANEOUS
  Administered 2024-01-26: 5 [IU] via SUBCUTANEOUS
  Administered 2024-01-26: 3 [IU] via SUBCUTANEOUS
  Administered 2024-01-26: 5 [IU] via SUBCUTANEOUS
  Administered 2024-01-27: 3 [IU] via SUBCUTANEOUS
  Administered 2024-01-27: 5 [IU] via SUBCUTANEOUS
  Administered 2024-01-27 – 2024-01-28 (×2): 1 [IU] via SUBCUTANEOUS

## 2024-01-24 MED ORDER — DOCUSATE SODIUM 100 MG PO CAPS
100.0000 mg | ORAL_CAPSULE | Freq: Every day | ORAL | Status: DC
  Administered 2024-01-24 – 2024-01-27 (×4): 100 mg via ORAL
  Filled 2024-01-24 (×4): qty 1

## 2024-01-24 MED ORDER — GERHARDT'S BUTT CREAM
TOPICAL_CREAM | Freq: Two times a day (BID) | CUTANEOUS | Status: DC
  Administered 2024-01-26: 1 via TOPICAL
  Filled 2024-01-24: qty 60

## 2024-01-24 MED ORDER — ALUM & MAG HYDROXIDE-SIMETH 200-200-20 MG/5ML PO SUSP: 30.0000 mL | ORAL | Status: DC | PRN

## 2024-01-24 MED ORDER — VITAMIN C 500 MG PO TABS
1000.0000 mg | ORAL_TABLET | Freq: Every day | ORAL | Status: DC
  Administered 2024-01-24 – 2024-01-28 (×5): 1000 mg via ORAL
  Filled 2024-01-24 (×5): qty 2

## 2024-01-24 MED ORDER — ASPIRIN 81 MG PO TBEC: 81.0000 mg | DELAYED_RELEASE_TABLET | Freq: Every day | ORAL | Status: DC

## 2024-01-24 MED ORDER — INSULIN ASPART 100 UNIT/ML IJ SOLN
15.0000 [IU] | Freq: Once | INTRAMUSCULAR | Status: AC
  Administered 2024-01-24: 15 [IU] via SUBCUTANEOUS

## 2024-01-24 MED ORDER — INSULIN GLARGINE 100 UNIT/ML ~~LOC~~ SOLN
5.0000 [IU] | Freq: Two times a day (BID) | SUBCUTANEOUS | Status: DC
  Administered 2024-01-25 (×3): 5 [IU] via SUBCUTANEOUS
  Filled 2024-01-24 (×5): qty 0.05

## 2024-01-24 MED ORDER — PANTOPRAZOLE SODIUM 40 MG PO TBEC
40.0000 mg | DELAYED_RELEASE_TABLET | Freq: Every day | ORAL | Status: DC
  Administered 2024-01-24 – 2024-01-27 (×3): 40 mg via ORAL
  Filled 2024-01-24: qty 1
  Filled 2024-01-24: qty 2
  Filled 2024-01-24: qty 1

## 2024-01-24 MED ORDER — BISACODYL 10 MG RE SUPP: 10.0000 mg | Freq: Every day | RECTAL | Status: DC | PRN

## 2024-01-24 MED ORDER — ONDANSETRON HCL 4 MG/2ML IJ SOLN
4.0000 mg | Freq: Four times a day (QID) | INTRAMUSCULAR | Status: DC | PRN
  Administered 2024-01-26 – 2024-01-27 (×2): 4 mg via INTRAVENOUS
  Filled 2024-01-24 (×2): qty 2

## 2024-01-24 MED ORDER — SENNOSIDES-DOCUSATE SODIUM 8.6-50 MG PO TABS: 1.0000 | ORAL_TABLET | Freq: Every evening | ORAL | Status: DC | PRN

## 2024-01-24 MED ORDER — LOSARTAN POTASSIUM 25 MG PO TABS
25.0000 mg | ORAL_TABLET | Freq: Every day | ORAL | Status: DC
  Administered 2024-01-25 – 2024-01-28 (×4): 25 mg via ORAL
  Filled 2024-01-24 (×4): qty 1

## 2024-01-24 MED ORDER — CLOPIDOGREL BISULFATE 75 MG PO TABS
75.0000 mg | ORAL_TABLET | Freq: Every day | ORAL | Status: DC
  Administered 2024-01-25 – 2024-01-28 (×4): 75 mg via ORAL
  Filled 2024-01-24 (×4): qty 1

## 2024-01-24 MED ORDER — GUAIFENESIN-DM 100-10 MG/5ML PO SYRP: 5.0000 mL | ORAL_SOLUTION | Freq: Four times a day (QID) | ORAL | Status: DC | PRN

## 2024-01-24 NOTE — Progress Notes (Signed)
 Discharged to CIR, transported in bed. Report was called to [facility] by RN Grayce.

## 2024-01-24 NOTE — H&P (Signed)
 Physical Medicine and Rehabilitation Admission H&P        Chief Complaint  Patient presents with   Functional deficits due to CVA      HPI: John [name] is a 78 year old R handed  male with PMHx of HTN, HLD, DM type 2, history of CVA, cognitive deficits, CAD, heart block s/p PPM, chronic HFrEF, provoked DVT (no longer anticoagulated), BPH, urinary retention with foley catheter, and recent admissions for UTI, bacteremia, pneumonia, nausea and vomiting, recently discharge to SNF who presented  presented to [facility] on 01/16/2024, with complaints of left-sided weakness. An initial CT scan of the head showed no acute abnormalities, and a chest X-ray was negative. Urinalysis was abnormal with a urine culture positive for ESBL E. Coli, and the patient received IV meropenem that was to transition to fosfomycin.   Initial lab results were significant for a glucose level of 164 mg/dL, procalcitonin of 534 ng/mL, WBC of 8.5 x 10^9/L, and hemoglobin of 10 g/dL. An EKG showed junctional rhythm with right bundle branch block. Neurology was consulted for further evaluation. The patient was transferred to [facility] on 01/18/2024. An MRI was positive for a right pontine infarct with a possible punctate petechial hemorrhage. A CTA of the head and neck showed severe right posterior cerebral artery stenosis. Neurology felt this was a lacunar infarct and recommended to continue aspirin  and Plavix  for 3 weeks then aspirin  alone.Patient continues with Foley catheter.   Prior to hospital admission, the patient required assistance with ADLs and had impaired functional cognition. The patient was residing in the [facility] skilled facility. Upon transfer, the patient required minimal assistance with ADLs and moderate assistance+2  for physical assistance for mobility and transfers, specifically needing manual assistance to stand from the edge of the bed and steadying with manual assistance for  placement of the left foot. Therapy evaluations completed due to patient decreased functional mobility was admitted for a comprehensive rehab program.    Pt reports Has foley-  sounds like chronic.  Pt reports has a BM 1x/week and not sure when had LBM, but denies constipation.    Denies Nausea/vomiting, numbness or tingling.        Review of Systems  Constitutional: Negative.   HENT: Negative.    Eyes: Negative.   Respiratory: Negative.    Cardiovascular: Negative.  Negative for leg swelling.  Gastrointestinal: Negative.  Negative for abdominal pain, constipation and nausea.       LBM 10/14  Genitourinary:  Negative for flank pain and hematuria.       Chronic foley   Musculoskeletal: Negative.  Negative for back pain and myalgias.  Skin: Negative.        Healing sacral wound   Neurological:  Positive for speech change, focal weakness and weakness. Negative for tingling and sensory change.  Endo/Heme/Allergies: Negative.   Psychiatric/Behavioral:  Positive for memory loss.   All other systems reviewed and are negative.      Past Medical History:  Diagnosis Date   BPH (benign prostatic hyperplasia)     CTS (carpal tunnel syndrome)     Depression     Diabetes mellitus     Essential hypertension     GERD (gastroesophageal reflux disease)     History of breast cancer     Hypercholesteremia     Neuropathy     Stroke [facility])               Past  Surgical History:  Procedure Laterality Date   CATARACT EXTRACTION, BILATERAL   01/18/2017   CORONARY STENT INTERVENTION N/A 08/10/2023    Procedure: CORONARY STENT INTERVENTION;  Surgeon: Darron Deatrice LABOR, MD;  Location: [facility] INVASIVE CV LAB;  Service: Cardiovascular;  Laterality: N/A;   LEFT HEART CATH AND CORONARY ANGIOGRAPHY N/A 08/10/2023    Procedure: LEFT HEART CATH AND CORONARY ANGIOGRAPHY;  Surgeon: Darron Deatrice LABOR, MD;  Location: [facility] INVASIVE CV LAB;  Service: Cardiovascular;  Laterality: N/A;   LITHOTRIPSY       MASTECTOMY  Right 05/02/2018   PACEMAKER IMPLANT N/A 05/13/2023    Procedure: PACEMAKER IMPLANT;  Surgeon: Kennyth Chew, MD;  Location: [facility] INVASIVE CV LAB;  Service: Cardiovascular;  Laterality: N/A;   TEMPORARY PACEMAKER N/A 05/11/2023    Procedure: TEMPORARY PACEMAKER;  Surgeon: Swaziland, Peter M, MD;  Location: [facility] INVASIVE CV LAB;  Service: Cardiovascular;  Laterality: N/A;   TONSILLECTOMY AND ADENOIDECTOMY       TRANSESOPHAGEAL ECHOCARDIOGRAM (CATH LAB) N/A 12/08/2023    Procedure: TRANSESOPHAGEAL ECHOCARDIOGRAM;  Surgeon: Raford Riggs, MD;  Location: [facility] INVASIVE CV LAB;  Service: Cardiovascular;  Laterality: N/A;             Family History  Problem Relation Age of Onset   Lymphoma Mother     Cancer Mother     Diabetes Father     Stroke Father     Ovarian cancer Sister     Cancer Sister     Cancer Paternal Grandmother          Social History:  reports that he has never smoked. He has never used smokeless tobacco. He reports that he does not drink alcohol and does not use drugs. Allergies:  Allergies       Allergies  Allergen Reactions   Ramipril Anaphylaxis   Adhesive [Tape] Rash   Other Diarrhea      Severe intolerance to Chemotherapy in the past.   Sertraline Other (See Comments)      Extreme headaches            Medications Prior to Admission  Medication Sig Dispense Refill   acetaminophen  (TYLENOL ) 325 MG tablet Take 2 tablets (650 mg total) by mouth every 6 (six) hours as needed for mild pain (pain score 1-3) or fever (or Fever >/= 101). (Patient taking differently: Take 650 mg by mouth every 8 (eight) hours as needed for mild pain (pain score 1-3) or fever (or Fever >/= 101).)       Ascorbic Acid  (VITAMIN C) 1000 MG tablet Take 1,000 mg by mouth 2 (two) times a week.       atorvastatin  (LIPITOR ) 80 MG tablet Take 1 tablet (80 mg total) by mouth at bedtime. 90 tablet 1   bisacodyl  (DULCOLAX) 10 MG suppository Place 10 mg rectally daily as needed for moderate  constipation.       clopidogrel  (PLAVIX ) 75 MG tablet Take 1 tablet (75 mg total) by mouth daily with breakfast. 90 tablet 0   cyanocobalamin  1000 MCG tablet Take 1 tablet (1,000 mcg total) by mouth daily. 100 tablet 0   escitalopram  (LEXAPRO ) 10 MG tablet Take 1 tablet (10 mg total) by mouth daily. 90 tablet 1   ferrous sulfate  (FEROSUL) 325 (65 FE) MG tablet Take 1 tablet (325 mg total) by mouth daily with breakfast. 100 tablet 0   finasteride  (PROSCAR ) 5 MG tablet Take 1 tablet (5 mg total) by mouth daily. *Need appointment for future refills.* 30 tablet 0  insulin  glargine (LANTUS ) 100 UNIT/ML injection Inject 0.15 mLs (15 Units total) into the skin daily. 10 mL 11   losartan  (COZAAR ) 25 MG tablet Take 1 tablet (25 mg total) by mouth daily. 60 tablet 0   magnesium  hydroxide (MILK OF MAGNESIA) 400 MG/5ML suspension Take 30 mLs by mouth daily as needed for mild constipation.       metFORMIN  (GLUCOPHAGE ) 500 MG tablet Take 1 tablet (500 mg total) by mouth 2 (two) times daily with a meal. 60 tablet 0   Multiple Vitamin (MULTIVITAMIN) tablet Take 1 tablet by mouth 2 (two) times a week.       ondansetron  (ZOFRAN -ODT) 4 MG disintegrating tablet Take 1 tablet (4 mg total) by mouth every 8 (eight) hours as needed for nausea or vomiting. 20 tablet 0   pantoprazole  (PROTONIX ) 40 MG tablet Take 1 tablet (40 mg total) by mouth at bedtime. 30 tablet 0   polyethylene glycol (MIRALAX  / GLYCOLAX ) 17 g packet Take 17 g by mouth daily.       PRESCRIPTION MEDICATION Place 1 suppository rectally daily as needed (Constipation).       senna-docusate (SENOKOT-S) 8.6-50 MG tablet Take 1 tablet by mouth 2 (two) times daily. 100 tablet 0   clotrimazole -betamethasone  (LOTRISONE ) cream Apply 1 Application topically daily. (Patient not taking: Reported on 01/17/2024) 30 g 0   hyoscyamine  (LEVSIN  SL) 0.125 MG SL tablet Place 1 tablet (0.125 mg total) under the tongue every 4 (four) hours as needed for bladder spasms. (Patient  not taking: Reported on 01/17/2024) 20 tablet 0              Home: Home Living Family/patient expects to be discharged to:: Private residence Living Arrangements: Spouse/significant other Available Help at Discharge: Family, Available 24 hours/day Type of Home: House Home Access: Stairs to enter [employer] of Steps: 4 Entrance Stairs-Rails: Can reach both (on stairs in back of the house) Home Layout: Able to live on main level with bedroom/bathroom (has bed in living room) Alternate Level Stairs-Number of Steps: He reported sleeping in a recliner on the main level of the home. All bedrooms are upstairs Alternate Level Stairs-Rails: Right, Left, Can reach both Bathroom Shower/Tub: Health visitor: Standard Bathroom Accessibility: Yes Home Equipment: Agricultural consultant (2 wheels), [employer] - single point, BSC/3in1, Wheelchair - power Additional Comments: pt was DC from SNF on day of adm 10/6   Functional History: Prior Function Prior Level of Function : Independent/Modified Independent Mobility Comments: was ambulating with RW x 20' prior to DC to [facility] 9/16 after T12 compr fx, unsure distance while in SNF ADLs Comments: unsure   Functional Status:  Mobility: Bed Mobility Overal bed mobility: Needs Assistance Bed Mobility: Supine to Sit Rolling: Min assist Sidelying to sit: Mod assist, +2 for safety/equipment, +2 for physical assistance, Used rails Supine to sit: Mod assist, HOB elevated, Used rails, +2 for physical assistance Sit to supine: HOB elevated, Used rails, Mod assist, +2 for physical assistance General bed mobility comments: Pt initiating bringing BLE's towards edge of bed, assist to execute and raise trunk up to sitting position. Use of rail Transfers Overall transfer level: Needs assistance Equipment used: Ambulation equipment used Transfers: Sit to/from Stand Sit to Stand: +2 physical assistance, Min assist Transfer via Lift Equipment:  Stedy General transfer comment: MinA + 2 to power up to stand from edge of bed and stedy flaps, manual assist for placement of L foot, pt standing with bilateral foot increased ER Ambulation/Gait General Gait  Details: unable this date   ADL: ADL Overall ADL's : Needs assistance/impaired Eating/Feeding: Minimal assistance, Bed level Eating/Feeding Details (indicate cue type and reason): beverage and container/condiment mgmt; feeds self using R hand Grooming: Wash/dry hands, Wash/dry face, Minimal assistance, Standing Grooming Details (indicate cue type and reason): standing at sink in [place] Upper Body Bathing: Sitting, Cueing for compensatory techniques, Minimal assistance, Moderate assistance Upper Body Bathing Details (indicate cue type and reason): In supported sitting. Lower Body Bathing: Maximal assistance, Sitting/lateral leans, Cueing for compensatory techniques Lower Body Bathing Details (indicate cue type and reason): In supported sitting. Upper Body Dressing : Moderate assistance, Sitting Lower Body Dressing: Cueing for compensatory techniques, Cueing for sequencing, Maximal assistance Lower Body Dressing Details (indicate cue type and reason): In supported sitting. Toilet Transfer: Maximal assistance, Cueing for sequencing, Cueing for safety, BSC/3in1, +2 for physical assistance Toileting- Clothing Manipulation and Hygiene: Total assistance Toileting - Clothing Manipulation Details (indicate cue type and reason): at bedside commode level, based on clinical judgement   Cognition: Cognition Orientation Level: Oriented to person, Oriented to place, Oriented to time, Disoriented to situation Cognition Arousal: Alert Behavior During Therapy: Flat affect   Physical Exam: Blood pressure (!) 134/58, pulse 78, temperature 98.2 F (36.8 C), temperature source Oral, resp. rate 15, SpO2 98%. Physical Exam Vitals and nursing note reviewed.  Constitutional:      Appearance: Normal  appearance. He is normal weight.     Comments: Pt awake, alert, sitting up in bedside chair- finished 50% of tray, NAD   HENT:     Head: Normocephalic and atraumatic.     Comments: No facial droop when smiles Tongue midline    Right Ear: External ear normal.     Left Ear: External ear normal.     Nose: Nose normal. No congestion.     Mouth/Throat:     Mouth: Mucous membranes are dry.     Pharynx: Oropharynx is clear. No oropharyngeal exudate.  Eyes:     General:        Right eye: No discharge.        Left eye: No discharge.     Extraocular Movements: Extraocular movements intact.  Cardiovascular:     Rate and Rhythm: Normal rate and regular rhythm.     Heart sounds: Normal heart sounds. No murmur heard.    No gallop.  Pulmonary:     Effort: Pulmonary effort is normal. No respiratory distress.     Breath sounds: Normal breath sounds. No wheezing, rhonchi or rales.  Abdominal:     Palpations: Abdomen is soft.     Comments: Hypoactive, slightly distended-  Genitourinary:    Comments: Foley in place- medium amber urine Musculoskeletal:        General: Normal range of motion.     Cervical back: Neck supple. No tenderness.     Comments: RUE 5/5 in biceps, triceps, WE, grip and FA LUE- 4-/5 in Biceps, triceps, grip and wouldn't/couldn't participate in WE/FA RLE_ HF/KE/KF/DF and PF 5/5- delayed responses LLE- HF 2/5; KE/KF 2+ to 3-/5 and DF/PF 3-/5     Skin:    General: Skin is warm and dry.  Neurological:     Mental Status: He is alert.     Comments: Able to tell me location- year and month- not date- didn't guess Intact to light touch in all 4 extremities and face  Psychiatric:     Comments: Slightly brighter than expected vague       Lab Results Last  48 Hours        Results for orders placed or performed during the hospital encounter of 01/16/24 (from the past 48 hours)  Glucose, capillary     Status: Abnormal    Collection Time: 01/22/24  4:28 PM  Result Value Ref  Range    Glucose-Capillary 317 (H) 70 - 99 mg/dL      Comment: Glucose reference range applies only to samples taken after fasting for at least 8 hours.  Glucose, capillary     Status: Abnormal    Collection Time: 01/22/24  9:43 PM  Result Value Ref Range    Glucose-Capillary 221 (H) 70 - 99 mg/dL      Comment: Glucose reference range applies only to samples taken after fasting for at least 8 hours.  Glucose, capillary     Status: Abnormal    Collection Time: 01/23/24  6:28 AM  Result Value Ref Range    Glucose-Capillary 186 (H) 70 - 99 mg/dL      Comment: Glucose reference range applies only to samples taken after fasting for at least 8 hours.  Glucose, capillary     Status: Abnormal    Collection Time: 01/23/24 12:15 PM  Result Value Ref Range    Glucose-Capillary 272 (H) 70 - 99 mg/dL      Comment: Glucose reference range applies only to samples taken after fasting for at least 8 hours.  Glucose, capillary     Status: Abnormal    Collection Time: 01/23/24  4:16 PM  Result Value Ref Range    Glucose-Capillary 268 (H) 70 - 99 mg/dL      Comment: Glucose reference range applies only to samples taken after fasting for at least 8 hours.  Glucose, capillary     Status: Abnormal    Collection Time: 01/23/24  8:29 PM  Result Value Ref Range    Glucose-Capillary 356 (H) 70 - 99 mg/dL      Comment: Glucose reference range applies only to samples taken after fasting for at least 8 hours.  Glucose, capillary     Status: Abnormal    Collection Time: 01/23/24  9:18 PM  Result Value Ref Range    Glucose-Capillary 341 (H) 70 - 99 mg/dL      Comment: Glucose reference range applies only to samples taken after fasting for at least 8 hours.  Glucose, capillary     Status: Abnormal    Collection Time: 01/24/24  6:00 AM  Result Value Ref Range    Glucose-Capillary 289 (H) 70 - 99 mg/dL      Comment: Glucose reference range applies only to samples taken after fasting for at least 8 hours.   Glucose, capillary     Status: Abnormal    Collection Time: 01/24/24 11:46 AM  Result Value Ref Range    Glucose-Capillary 446 (H) 70 - 99 mg/dL      Comment: Glucose reference range applies only to samples taken after fasting for at least 8 hours.  Glucose, random     Status: Abnormal    Collection Time: 01/24/24  1:41 PM  Result Value Ref Range    Glucose, Bld 396 (H) 70 - 99 mg/dL      Comment: Glucose reference range applies only to samples taken after fasting for at least 8 hours. Performed at [facility], [address], [place], [place] [address]        Imaging Results (Last 48 hours)  No results found.  Blood pressure (!) 134/58, pulse 78, temperature 98.2 F (36.8 C), temperature source Oral, resp. rate 15, SpO2 98%.   Medical Problem List and Plan: 1. Functional deficits secondary to acute pontine infarct             -patient may  shower             -ELOS/Goals: 9-12 days Min A to supervision             Admit to CIR- is appropriate 2.  Antithrombotics: -DVT/anticoagulation:  Pharmaceutical: Lovenox              -antiplatelet therapy: Asprin and Plavix  x 3 weeks then Aspirin  alone (ends 10/28).  3. Pain Management: Tylenol  as needed 4. Mood/Behavior/Sleep: LCSW to follow for evaluation and support when available.              -antipsychotic agents:  Lexapro  ---was on Lexapro  10 mg PTA, wife believes SNF was withholding medications and requests he resume use.   5. Neuropsych/cognition: This patient is not fully capable of making decisions on his own behalf. 6. Skin/Wound Care: Routine pressure relief measures             -Stage 2 Sacral wound--wound care orders placed--Gerhardt's butt cream +Zinc  PO 7. Fluids/Electrolytes/Nutrition: Monitor intake and output.  CBC/CMET in a.m.             - SLP consult             -Cardiac diet +Ensure, Zinc  and vitamin C  8.  Acute pontine infarct: Hx of multiple lacunar infarcts -Continue aspirin  and Plavix  until  10/28 then aspirin  alone.  Atorvastatin  80 mg daily.   9.  CAD/HLD: Atorvastatin    9.  Acute cystitis: Urine culture ESBL E. Coli---Treated with meropenem then 1 dose of fosfomycin.   10.  Hx urinary retention/BPH: Foley catheter present--- continue to monitor. - I think based on chart it's long term?    11.  HTN: Resumed losartan  25 mg daily   12.  T2DM: A1c 7.5% continue CBGs ac/hs with SSI.             - Lantus  increased to 5 units twice daily--monitor for hypoglycemia 13.  Heart block: S/p PPM   14.  GERD: Continue PPI    15. Constipation?- Pt didn't remember LBM- was earlier this AM- medium type 6- incontinent       Daphne LOISE Satterfield, NP 01/24/2024   I have personally performed a face to face diagnostic evaluation of this patient and formulated the key components of the plan.  Additionally, I have personally reviewed laboratory data, imaging studies, as well as relevant notes and concur with the physician assistant's documentation above.   The patient's status has not changed from the original H&P.  Any changes in documentation from the acute care chart have been noted above.

## 2024-01-24 NOTE — Progress Notes (Signed)
 Pt arrived to 4W01 a/ox2, vitals stable, and reported received by Grayce, RN.

## 2024-01-24 NOTE — Plan of Care (Signed)
 He has been doing well today. Sitting in a chair. Has to be reminded to call for help and not attempt to get up unassisted.  Problem: Coping: Goal: Ability to adjust to condition or change in health will improve 01/24/2024 1531 by Siri Soja, RN Outcome: Progressing 01/24/2024 1527 by Siri Soja, RN Outcome: Progressing   Problem: Skin Integrity: Goal: Risk for impaired skin integrity will decrease 01/24/2024 1531 by Siri Soja, RN Outcome: Progressing 01/24/2024 1527 by Siri Soja, RN Outcome: Progressing   Problem: Tissue Perfusion: Goal: Adequacy of tissue perfusion will improve 01/24/2024 1531 by Siri Soja, RN Outcome: Progressing 01/24/2024 1527 by Siri Soja, RN Outcome: Progressing   Problem: Activity: Goal: Risk for activity intolerance will decrease 01/24/2024 1531 by Siri Soja, RN Outcome: Progressing 01/24/2024 1527 by Siri Soja, RN Outcome: Progressing   Problem: Skin Integrity: Goal: Risk for impaired skin integrity will decrease 01/24/2024 1531 by Siri Soja, RN Outcome: Progressing 01/24/2024 1527 by Siri Soja, RN Outcome: Progressing   Problem: Ischemic Stroke/TIA Tissue Perfusion: Goal: Complications of ischemic stroke/TIA will be minimized Outcome: Progressing

## 2024-01-24 NOTE — Progress Notes (Addendum)
 Physical Therapy Treatment Patient Details Name: John [name] MRN: [id number] DOB: 1945/05/16 Today's Date: 01/24/2024   History of Present Illness 78 y.o. male presents to [facility] 01/16/24 with L sided weakness and inability to get out of a car. MRI showed acute R pontine infarct. PMH: hypertension, hyperlipidemia, type 2 diabetes mellitus, history of CVA, cognitive deficits, CAD, heart block s/p PPM, chronic HFrEF, provoked DVT, BPH, urinary retention with Foley catheter    PT Comments  Pt agreeable to participate and making steady progress towards his physical therapy goals today, with improved midline positioning and transfer ability. Pt requiring +2 min-mod assist for functional mobility. Worked on anterior weight shift in sitting, truncal rotation, and BUE reaching. Pt stood to [place] to transfer over to chair. Continued emphasis on posture and proprioception with external feedback of mirror. Patient will benefit from intensive inpatient follow-up therapy, >3 hours/day.    If plan is discharge home, recommend the following: A lot of help with bathing/dressing/bathroom;Assistance with cooking/housework;Help with stairs or ramp for entrance;Assist for transportation;Supervision due to cognitive status;Direct supervision/assist for medications management;Direct supervision/assist for financial management;Two people to help with walking and/or transfers   Can travel by private vehicle     No  Equipment Recommendations  None recommended by PT    Recommendations for Other Services       Precautions / Restrictions Precautions Precautions: Fall Recall of Precautions/Restrictions: Impaired Precaution/Restrictions Comments: L hemiparesis Restrictions Weight Bearing Restrictions Per Provider Order: No     Mobility  Bed Mobility Overal bed mobility: Needs Assistance Bed Mobility: Supine to Sit     Supine to sit: Mod assist, HOB elevated, Used rails, +2 for physical assistance      General bed mobility comments: Pt initiating bringing BLE's towards edge of bed, assist to execute and raise trunk up to sitting position. Use of rail    Transfers Overall transfer level: Needs assistance Equipment used: Ambulation equipment used Transfers: Sit to/from Stand Sit to Stand: +2 physical assistance, Min assist           General transfer comment: MinA + 2 to power up to stand from edge of bed and stedy flaps, manual assist for placement of L foot, pt standing with bilateral foot increased ER    Ambulation/Gait               General Gait Details: unable this date   Stairs             Wheelchair Mobility     Tilt Bed    Modified Rankin (Stroke Patients Only) Modified Rankin (Stroke Patients Only) Pre-Morbid Rankin Score: Moderately severe disability Modified Rankin: Severe disability     Balance Overall balance assessment: Needs assistance Sitting-balance support: Feet supported, Bilateral upper extremity supported Sitting balance-Leahy Scale: Poor Sitting balance - Comments: Posterior and left lateral lean. Progressing to CGA-minA with bilateral hand support on chair positioned in front of him                                    Communication Communication Communication: Impaired Factors Affecting Communication: Hearing impaired  Cognition Arousal: Alert Behavior During Therapy: Flat affect   PT - Cognitive impairments: Awareness, Memory, Attention, Initiation, Sequencing, Problem solving, Safety/Judgement                       PT - Cognition Comments: slow to respond, requires cueing Following commands:  Impaired Following commands impaired: Follows one step commands inconsistently, Follows one step commands with increased time    Cueing Cueing Techniques: Verbal cues, Tactile cues, Visual cues  Exercises Other Exercises Other Exercises: Sitting: anterior chair slides with BUE's x 3 to promote anterior weight  shift, truncal rotation to R/L Other Exercises: Standing in Stedy: weight shifts to R/L x 3 Other Exercises: Sitting in Stedy: BUE reaching    General Comments        Pertinent Vitals/Pain Pain Assessment Pain Assessment: Faces Faces Pain Scale: No hurt    Home Living                          Prior Function            PT Goals (current goals can now be found in the care plan section) Acute Rehab PT Goals Patient Stated Goal: I have no goals. PT Goal Formulation: Patient unable to participate in goal setting Time For Goal Achievement: 02/01/24 Potential to Achieve Goals: Fair Progress towards PT goals: Progressing toward goals    Frequency    Min 3X/week      PT Plan      Co-evaluation              AM-PAC PT 6 Clicks Mobility   Outcome Measure  Help needed turning from your back to your side while in a flat bed without using bedrails?: A Little Help needed moving from lying on your back to sitting on the side of a flat bed without using bedrails?: A Lot Help needed moving to and from a bed to a chair (including a wheelchair)?: Total Help needed standing up from a chair using your arms (e.g., wheelchair or bedside chair)?: Total Help needed to walk in hospital room?: Total Help needed climbing 3-5 steps with a railing? : Total 6 Click Score: 9    End of Session Equipment Utilized During Treatment: Gait belt Activity Tolerance: Patient tolerated treatment well Patient left: with call bell/phone within reach;in chair;with chair alarm set Nurse Communication: Mobility status PT Visit Diagnosis: Unsteadiness on feet (R26.81);Muscle weakness (generalized) (M62.81);Other abnormalities of gait and mobility (R26.89);Hemiplegia and hemiparesis Hemiplegia - Right/Left: Left Hemiplegia - dominant/non-dominant: Non-dominant Hemiplegia - caused by: Cerebral infarction     Time: 1200-1222 PT Time Calculation (min) (ACUTE ONLY): 22 min  Charges:     $Therapeutic Activity: 8-22 mins PT General Charges $$ ACUTE PT VISIT: 1 Visit                     John [name], PT, DPT Acute Rehabilitation Services Office [phone]    John [name] 01/24/2024, 12:38 PM

## 2024-01-24 NOTE — Progress Notes (Signed)
 Inpatient Rehab Admissions Coordinator:   I did receive insurance approval for CIR admission.  I do not have a bed for this patient to admit today.  I updated his wife over the phone and let her know I hope to have a bed in the next day or so.   Reche Lowers, PT, DPT Admissions Coordinator [phone] 01/24/24  12:08 PM

## 2024-01-24 NOTE — Plan of Care (Signed)

## 2024-01-24 NOTE — TOC Transition Note (Signed)
 Transition of Care [facility]) - Discharge Note   Patient Details  Name: John [name] MRN: [id number] Date of Birth: 1945-10-02  Transition of Care [facility]) CM/SW Contact:  Andrez JULIANNA George, RN Phone Number: 01/24/2024, 2:31 PM   Clinical Narrative:     Pt is discharging to CIR today. CM signing off.   Final next level of care: IP Rehab Facility Barriers to Discharge: No Barriers Identified   Patient Goals and CMS Choice   CMS Medicare.gov Compare Post Acute Care list provided to:: Patient Represenative (must comment) Choice offered to / list presented to : Spouse, Patient      Discharge Placement                       Discharge Plan and Services Additional resources added to the After Visit Summary for     Discharge Planning Services: CM Consult Post Acute Care Choice: IP Rehab                               Social Drivers of Health (SDOH) Interventions SDOH Screenings   Food Insecurity: No Food Insecurity (01/19/2024)  Housing: Low Risk  (01/19/2024)  Transportation Needs: No Transportation Needs (01/19/2024)  Utilities: Not At Risk (01/19/2024)  Alcohol Screen: Low Risk  (01/27/2023)  Depression (PHQ2-9): Low Risk  (01/28/2023)  Financial Resource Strain: Low Risk  (10/03/2023)  Physical Activity: Inactive (10/03/2023)  Social Connections: Unknown (01/19/2024)  Recent Concern: Social Connections - Socially Isolated (12/05/2023)  Stress: No Stress Concern Present (10/03/2023)  Tobacco Use: Low Risk  (01/16/2024)     Readmission Risk Interventions    12/06/2023    4:03 PM 05/11/2023   12:52 PM 04/22/2023   12:12 PM  Readmission Risk Prevention Plan  Medication Screening   Complete  Transportation Screening Complete Complete Complete  PCP or Specialist Appt within 3-5 Days  Complete   HRI or Home Care Consult  Complete   Social Work Consult for Recovery Care Planning/Counseling  Complete   Palliative Care Screening  Not Applicable   Medication Review Special educational needs teacher) Complete Complete   HRI or Home Care Consult Complete    SW Recovery Care/Counseling Consult Complete    Palliative Care Screening Not Applicable    Skilled Nursing Facility Not Applicable

## 2024-01-24 NOTE — Inpatient Diabetes Management (Signed)
 Inpatient Diabetes Program Recommendations  AACE/ADA: New Consensus Statement on Inpatient Glycemic Control   Target Ranges:  Prepandial:   less than 140 mg/dL      Peak postprandial:   less than 180 mg/dL (1-2 hours)      Critically ill patients:  140 - 180 mg/dL    Latest Reference Range & Units 01/23/24 06:28 01/23/24 12:15 01/23/24 16:16 01/23/24 20:29 01/23/24 21:18 01/24/24 06:00  Glucose-Capillary 70 - 99 mg/dL 813 (H) 727 (H) 731 (H) 356 (H) 341 (H) 289 (H)   Review of Glycemic Control  Diabetes history: DM2 Outpatient Diabetes medications: Lantus  15 units daily, Metformin  500 mg BID Current orders for Inpatient glycemic control: Lantus  5 units at bedtime, Novolog  0-9 units TID with meals   Inpatient Diabetes Program Recommendations:     Insulin : CBG 289 mg/dl this morning and post prandial up to 356 mg/dl on 89/86.  Please consider increasing Lantus  to 8 units at bedtime, adding Novolog  0-5 units at bedtime for bedtime correction, and ordering Novolog  5 units TID with meals for meal coverage if patient eats at least 50% of meals.   Thanks Earnie Gainer, RN, MSN, CDCES Diabetes Coordinator Inpatient Diabetes Program [phone] (Team Pager from 8am to 5pm)

## 2024-01-24 NOTE — Discharge Summary (Signed)
 Physician Discharge Summary   Patient: John [name] MRN: [id number] DOB: 1946-01-25  Admit date:     01/16/2024  Discharge date: 01/24/24  Discharge Physician: [name]   PCP: [name], DO   Recommendations at discharge:   Patient to be discharged to [facility] Continue aspirin  and Plavix  for 3 weeks till 02/07/2024 then continue only aspirin  81 mg daily  Discharge Diagnoses: Principal Problem:   Left-sided weakness Active Problems:   Status post placement of cardiac pacemaker   Acute cystitis without hematuria   History of CVA (cerebrovascular accident)   Weakness  Resolved Problems:   * No resolved hospital problems. *  Hospital Course: 78 y.o. male with medical history significant for hypertension, hyperlipidemia, type 2 diabetes mellitus, history of CVA, cognitive deficits, CAD, heart block s/p PPM chronic HFrEF, provoked DVT no longer anticoagulated, BPH, urinary retention with Foley catheter, and recent admissions with UTI, bacteremia, pneumonia, nausea and vomiting and discharged to SNF now presenting with left-sided weakness.   Assessment and Plan:  Acute pontine infarct - MRI brain showed acute right pontine infarct -Neurology consulted; likely lacunar infarct -CT head and neck showed severe right posterior cerebral artery stenosis - Recently had TEE on 12/08/2023 which showed EF of 30 to 35% with LV global hypokinesis -A1c 8.5 -- Atorvastatin  80 mg daily -Neurology recommends to continue aspirin  and Plavix  for 3 weeks and then aspirin  alone -PT evaluation obtained, plan to go to CIR     Acute cystitis Urine culture grew ESBL E. Coli - Cefepime  was discontinued and patient started on meropenem -He is afebrile, vitals are stable. -Meropenem was discontinued, patient was given 1 dose of fosfomycin.     T2DM - Last A1c 7.5% in April 2025 - SSI with meals, CBG monitoring -CBGs have been elevated - Will increase Lantus  to 5 units subcu twice  daily       HTN/HFrEF - Last TTE on 11/2023 showed EF 30-35%, LV global hypokinesis and mild protruding plaque involving the aortic root - Patient euvolemic on exam - Will restart losartan  25 mg daily   Hx of CVA - History of multiple lacunar infarcts Started aspirin  and Plavix  as above     History of urinary retention/ BPH - Continue Foley catheter-- will need outpatient follow up   Heart block s/p PPM - has pacemaker    CAD - Continue atorvastatin  -It seems patient was not taking Plavix  before he came to the hospital    HLD -atorvastatin    GERD - Continue Protonix        Consultants: Neurology Procedures performed:  Disposition: Rehabilitation facility Diet recommendation:  Discharge Diet Orders (From admission, onward)     Start     Ordered   01/24/24 0000  Diet - low sodium heart healthy        01/24/24 1446           Regular diet DISCHARGE MEDICATION: Allergies as of 01/24/2024       Reactions   Ramipril Anaphylaxis   Adhesive [tape] Rash   Other Diarrhea   Severe intolerance to Chemotherapy in the past.   Sertraline Other (See Comments)   Extreme headaches        Medication List     TAKE these medications    acetaminophen  325 MG tablet Commonly known as: TYLENOL  Take 2 tablets (650 mg total) by mouth every 6 (six) hours as needed for mild pain (pain score 1-3) or fever (or Fever >/= 101). What changed: when  to take this   aspirin  EC 81 MG tablet Take 1 tablet (81 mg total) by mouth daily. Swallow whole. Start taking on: January 25, 2024   atorvastatin  80 MG tablet Commonly known as: LIPITOR  Take 1 tablet (80 mg total) by mouth at bedtime.   bisacodyl  10 MG suppository Commonly known as: DULCOLAX Place 10 mg rectally daily as needed for moderate constipation.   clopidogrel  75 MG tablet Commonly known as: PLAVIX  Take 1 tablet (75 mg total) by mouth daily with breakfast for 21 days.   clotrimazole -betamethasone   cream Commonly known as: LOTRISONE  Apply 1 Application topically daily.   cyanocobalamin  1000 MCG tablet Commonly known as: VITAMIN B12 Take 1 tablet (1,000 mcg total) by mouth daily.   escitalopram  10 MG tablet Commonly known as: Lexapro  Take 1 tablet (10 mg total) by mouth daily.   FeroSul 325 (65 Fe) MG tablet Generic drug: ferrous sulfate  Take 1 tablet (325 mg total) by mouth daily with breakfast.   finasteride  5 MG tablet Commonly known as: PROSCAR  Take 1 tablet (5 mg total) by mouth daily. *Need appointment for future refills.*   hyoscyamine  0.125 MG SL tablet Commonly known as: LEVSIN  SL Place 1 tablet (0.125 mg total) under the tongue every 4 (four) hours as needed for bladder spasms.   insulin  aspart 100 UNIT/ML injection Commonly known as: novoLOG  Inject 3 Units into the skin 3 (three) times daily with meals.   insulin  aspart 100 UNIT/ML injection Commonly known as: novoLOG  Inject 0-9 Units into the skin 3 (three) times daily with meals. Sliding scale insulin  Less than 70 initiate hypoglycemia protocol 70-120  0 units 120-150 1 unit 151-200 2 units 201-250 3 units 251-300 5 units 301-350 7 units 351-400 9 units  Greater than 400 call MD   insulin  glargine 100 UNIT/ML injection Commonly known as: LANTUS  Inject 0.15 mLs (15 Units total) into the skin daily.   losartan  25 MG tablet Commonly known as: COZAAR  Take 1 tablet (25 mg total) by mouth daily.   magnesium  hydroxide 400 MG/5ML suspension Commonly known as: MILK OF MAGNESIA Take 30 mLs by mouth daily as needed for mild constipation.   metFORMIN  500 MG tablet Commonly known as: GLUCOPHAGE  Take 1 tablet (500 mg total) by mouth 2 (two) times daily with a meal.   multivitamin tablet Take 1 tablet by mouth 2 (two) times a week.   ondansetron  4 MG disintegrating tablet Commonly known as: ZOFRAN -ODT Take 1 tablet (4 mg total) by mouth every 8 (eight) hours as needed for nausea or vomiting.    pantoprazole  40 MG tablet Commonly known as: PROTONIX  Take 1 tablet (40 mg total) by mouth at bedtime.   polyethylene glycol 17 g packet Commonly known as: MIRALAX  / GLYCOLAX  Take 17 g by mouth daily.   PRESCRIPTION MEDICATION Place 1 suppository rectally daily as needed (Constipation).   Stool Softener/Laxative 50-8.6 MG tablet Generic drug: senna-docusate Take 1 tablet by mouth 2 (two) times daily. What changed:  when to take this reasons to take this   vitamin C 1000 MG tablet Take 1,000 mg by mouth 2 (two) times a week.        Discharge Exam: There were no vitals filed for this visit. General-appears in no acute distress Heart-S1-S2, regular, no murmur auscultated Lungs-clear to auscultation bilaterally, no wheezing or crackles auscultated Abdomen-soft, nontender, no organomegaly Extremities-no edema in the lower extremities Neuro-alert, oriented x3, no focal deficit noted  Condition at discharge: good  The results of significant diagnostics from  this hospitalization (including imaging, microbiology, ancillary and laboratory) are listed below for reference.   Imaging Studies: CT ANGIO HEAD NECK W WO CM Result Date: 01/19/2024 CLINICAL DATA:  Neuro deficit, acute stroke suspected EXAM: CT ANGIOGRAPHY HEAD AND NECK WITH AND WITHOUT CONTRAST TECHNIQUE: Multidetector CT imaging of the head and neck was performed using the standard protocol during bolus administration of intravenous contrast. Multiplanar CT image reconstructions and MIPs were obtained to evaluate the vascular anatomy. Carotid stenosis measurements (when applicable) are obtained utilizing NASCET criteria, using the distal internal carotid diameter as the denominator. RADIATION DOSE REDUCTION: This exam was performed according to the departmental dose-optimization program which includes automated exposure control, adjustment of the mA and/or kV according to patient size and/or use of iterative reconstruction  technique. CONTRAST:  75mL OMNIPAQUE  IOHEXOL  350 MG/ML SOLN COMPARISON:  CT head 01/16/2024, MRI 01/18/2024 FINDINGS: CT HEAD: There is low-attenuation in the cerebral white matter. There are old lacunar infarcts in both sides of the thalamus. There is no hemorrhage. No acute ischemic changes. No mass lesion. The ventricles are normal. Skull/sinuses/orbits: No significant abnormality. CTA NECK: CTA NECK Aortic arch: No proximal vessel stenosis. Right carotid: Normal Left carotid: Small amount of calcified plaque at the bifurcation with no stenosis Right vertebral: Normal Left vertebral: There is some calcification at the origin. Otherwise normal Soft tissues: No significant abnormality Other comments: None CTA HEAD: CTA HEAD Right anterior circulation: The internal carotid artery is patent without significant stenosis. The anterior and middle cerebral arteries are patent without significant stenosis or proximal branch occlusion. No aneurysm. Left anterior circulation: The internal carotid artery is patent without significant stenosis. The anterior and middle cerebral arteries are patent without significant stenosis or proximal branch occlusion. No aneurysm. Posterior circulation: Both vertebral arteries are patent. There is no significant basilar stenosis. Both posterior cerebral arteries are patent. There is a severe stenosis of the right posterior cerebral artery. IMPRESSION: 1. Old bilateral thalamic lacunar infarcts and chronic white matter disease 2. No carotid stenosis 3. No basilar stenosis 4. Severe right posterior cerebral artery stenosis Electronically Signed   By: Nancyann Burns M.D.   On: 01/19/2024 11:38   MR Cervical Spine W and Wo Contrast Result Date: 01/18/2024 EXAM: MRI CERVICAL SPINE WITH AND WITHOUT CONTRAST 01/18/2024 12:44:00 PM TECHNIQUE: Multiplanar multisequence MRI of the cervical spine was performed without and with the administration of 8 mL gadobutrol  (GADAVIST ) 1 MMOL/ML injection.  COMPARISON: Cervical spine CT 05/09/2023. 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 FINDINGS: BONES AND ALIGNMENT: Improved cervical lordosis compared to 05/09/2023. Normal vertebral body heights. Normal background bone marrow signal. No marrow edema. No abnormal enhancement. Age appropriate cervical spine degeneration. No degenerative cervical spinal stenosis. SPINAL CORD: No convincing spinal cord signal abnormality. No abnormal intradural enhancement. No dural thickening. SOFT TISSUES: No paraspinal mass. Trace retained secretions in the visible pharynx. Preserved major vascular flow voids in the neck. No significant spinal canal stenosis. IMPRESSION: 1. Age-appropriate cervical spine degeneration without significant spinal stenosis. 2. Visible spinal cord within normal limits. 3. See Brain MRI reported separately today. Electronically signed by: Helayne Hurst MD 01/18/2024 01:38 PM EDT RP Workstation: [id number]   MR Brain Wo Contrast (neuro protocol) Result Date: 01/18/2024 EXAM: MR Brain without Intravenous Contrast. CLINICAL HISTORY: Patient presents with acute neuro deficit, stroke suspected. TECHNIQUE: Magnetic resonance images of the brain without intravenous contrast in multiple planes. CONTRAST: Without. COMPARISON: Head CT 01/16/2024, Brain MRI 05/08/2023. FINDINGS: BRAIN: 7 mm focus of  restricted diffusion in the right pons (series 2 image 15), indicating acute infarction. Punctate petechial hemorrhage is present, with pontine hemosiderin increased on series 7 image 28 since January, but no malignant hemorrhagic transformation. No mass effect. No other diffusion restriction. Advanced chronic small vessel disease with stable chronic lacunar infarcts in the bilateral cerebral white matter, right lentiform nucleus, and bilateral thalami. Occasional chronic microhemorrhages. No midline shift or extra-axial fluid collection. No cerebellar tonsillar ectopia. The central  arterial and venous flow voids are patent. VENTRICLES: No hydrocephalus. ORBITS: The orbits are normal. SINUSES AND MASTOIDS: The sinuses and mastoid air cells are clear. BONES: No acute fracture or focal osseous lesion. IMPRESSION: 1. Acute right pontine infarct (7 mm) with possible punctate petechial hemorrhage, but No malignant hemorrhagic transformation and no mass effect. 2. No additional acute findings. Underlying Advanced chronic small vessel disease. Electronically signed by: Helayne Hurst MD 01/18/2024 01:34 PM EDT RP Workstation: [id number]   CT Head Wo Contrast Result Date: 01/16/2024 CLINICAL DATA:  Neuro deficit, acute, stroke suspected Worsening left-sided weakness EXAM: CT HEAD WITHOUT CONTRAST TECHNIQUE: Contiguous axial images were obtained from the base of the skull through the vertex without intravenous contrast. RADIATION DOSE REDUCTION: This exam was performed according to the departmental dose-optimization program which includes automated exposure control, adjustment of the mA and/or kV according to patient size and/or use of iterative reconstruction technique. COMPARISON:  CT head 12/19/2023, CT head 12/13/2023 FINDINGS: Brain: Patchy and confluent areas of decreased attenuation are noted throughout the deep and periventricular white matter of the cerebral hemispheres bilaterally, compatible with chronic microvascular ischemic disease. Redemonstration of bilateral chronic basal ganglia lacunar infarctions. No evidence of large-territorial acute infarction. No parenchymal hemorrhage. No mass lesion. No extra-axial collection. No mass effect or midline shift. No hydrocephalus. Basilar cisterns are patent. Vascular: No hyperdense vessel. Atherosclerotic calcifications are present within the cavernous internal carotid and vertebral arteries. Skull: No acute fracture or focal lesion. Sinuses/Orbits: Paranasal sinuses and mastoid air cells are clear. Bilateral lens replacement. Otherwise the orbits  are unremarkable. Other: None. IMPRESSION: No acute intracranial abnormality. Electronically Signed   By: Morgane  Naveau M.D.   On: 01/16/2024 18:36   DG Chest Port 1 View Result Date: 01/16/2024 CLINICAL DATA:  Weakness EXAM: PORTABLE CHEST - 1 VIEW COMPARISON:  12/13/2023 FINDINGS: Low lung volumes. No focal airspace consolidation, pleural effusion, or pneumothorax. No cardiomegaly. Left chest pacemaker with leads terminating in the right atrium and right ventricle. Aortic atherosclerosis. No acute fracture or destructive lesions. Multilevel thoracic osteophytosis. IMPRESSION: Low lung volumes.  Otherwise, no acute cardiopulmonary abnormality. Electronically Signed   By: Rogelia Myers M.D.   On: 01/16/2024 16:15   CUP PACEART REMOTE DEVICE CHECK Result Date: 01/04/2024 PPM Scheduled remote reviewed. Normal device function.  Presenting rhythm: AS/VS 1 AMS EGM, 4sec in duration, EGM c/w atrial driven tachycardia Next remote 91 days. LA, CVRS  DG Abd 1 View Result Date: 12/29/2023 CLINICAL DATA:  Constipation. EXAM: ABDOMEN - 1 VIEW COMPARISON:  CT 12/26/2023 FINDINGS: Diminished colonic stool from recent CT. Small volume of formed stool persists. No small bowel distension or evidence of obstruction. Low pelvis including rectum not included in the field of view. Gallstones on CT are not well seen by radiograph. Multiple pelvic phleboliths. IMPRESSION: Diminished colonic stool from recent CT. Small volume of formed stool persists. Electronically Signed   By: Andrea Gasman M.D.   On: 12/29/2023 10:23   CT ABDOMEN PELVIS W CONTRAST Result Date: 12/26/2023 CLINICAL DATA:  Abdominal pain, acute, nonlocalized EXAM: CT ABDOMEN AND PELVIS WITH CONTRAST TECHNIQUE: Multidetector CT imaging of the abdomen and pelvis was performed using the standard protocol following bolus administration of intravenous contrast. RADIATION DOSE REDUCTION: This exam was performed according to the departmental  dose-optimization program which includes automated exposure control, adjustment of the mA and/or kV according to patient size and/or use of iterative reconstruction technique. CONTRAST:  OMNIPAQUE  IOHEXOL  300 MG/ML  SOLN COMPARISON:  Multiple priors, most recently obtained 12/19/2023 and 12/13/2023. 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 There is mild atelectasis at both lung bases. No significant pleural effusion or basilar pneumothorax. Hepatobiliary: The liver is normal in density without suspicious focal abnormality. Small calcified gallstones are again noted. The gallbladder remains distended, although demonstrates no wall thickening or surrounding inflammation. There is no significant biliary dilatation. Pancreas: Mild atrophy. No pancreatic ductal dilatation or surrounding inflammation. Spleen: Scattered calcified granulomas. The spleen is normal in size without other focal abnormality. Adrenals/Urinary Tract: Both adrenal glands appear normal. No evidence of urinary tract calculus, suspicious renal lesion or hydronephrosis. A Foley catheter remains in place. The bladder is decompressed with diffuse wall thickening, similar to previous study. Stomach/Bowel: No enteric contrast administered. Mild distal esophageal wall thickening. The stomach appears unremarkable for its degree of distention. No bowel distension, wall thickening or surrounding inflammation identified. The appendix is tendon of Leigh identified and appears normal. There is mildly prominent stool throughout the colon. Vascular/Lymphatic: There are no enlarged abdominal or pelvic lymph nodes. Mild aortic and branch vessel atherosclerosis. No evidence of aneurysm or large vessel occlusion. Reproductive: Stable mild enlargement of the prostate gland. Other: No evidence of abdominal wall mass or hernia. No ascites or pneumoperitoneum. Musculoskeletal: Transitional lumbosacral anatomy with a  partially sacralized L5 segment. New mild superior endplate compression deformity at T12 without osseous retropulsion or significant paraspinal soft tissue abnormality. IMPRESSION: 1. No acute intra-abdominal findings or explanation for the patient's symptoms. 2. New mild superior endplate compression deformity at T12 without osseous retropulsion or significant paraspinal soft tissue abnormality. Correlate for point tenderness and recent injury. 3. Cholelithiasis without evidence of cholecystitis or biliary dilatation. 4. Decompressed bladder with persistent diffuse wall thickening. 5. Mildly prominent stool throughout the colon suggesting constipation. 6.  Aortic Atherosclerosis (ICD10-I70.0). Electronically Signed   By: Elsie Perone M.D.   On: 12/26/2023 12:01    Microbiology: Results for orders placed or performed during the hospital encounter of 01/16/24  Urine Culture     Status: Abnormal   Collection Time: 01/16/24  3:59 PM   Specimen: Urine, Clean Catch  Result Value Ref Range Status   Specimen Description   Final    URINE, CLEAN CATCH Performed at [facility], [address] [place], [place] [address]    Special Requests   Final    NONE Performed at [facility], [address], [place], [place] [address]    Culture (A)  Final    >=100,000 COLONIES/mL ESCHERICHIA COLI Confirmed Extended Spectrum Beta-Lactamase Producer (ESBL).  In bloodstream infections from ESBL organisms, carbapenems are preferred over piperacillin/tazobactam. They are shown to have a lower risk of mortality.    Report Status 01/19/2024 FINAL  Final   Organism ID, Bacteria ESCHERICHIA COLI (A)  Final      Susceptibility   Escherichia coli - MIC*    AMPICILLIN >=32 RESISTANT Resistant     CEFAZOLIN  (URINE) Value in next row Resistant      >=  32 RESISTANTThis is a modified FDA-approved test that has been validated and its performance characteristics determined by the  reporting laboratory.  This laboratory is certified under the Clinical Laboratory Improvement Amendments CLIA as qualified to perform high complexity clinical laboratory testing.    CEFEPIME  Value in next row Sensitive      >=32 RESISTANTThis is a modified FDA-approved test that has been validated and its performance characteristics determined by the reporting laboratory.  This laboratory is certified under the Clinical Laboratory Improvement Amendments CLIA as qualified to perform high complexity clinical laboratory testing.    ERTAPENEM Value in next row Sensitive      >=32 RESISTANTThis is a modified FDA-approved test that has been validated and its performance characteristics determined by the reporting laboratory.  This laboratory is certified under the Clinical Laboratory Improvement Amendments CLIA as qualified to perform high complexity clinical laboratory testing.    CEFTRIAXONE  Value in next row Resistant      >=32 RESISTANTThis is a modified FDA-approved test that has been validated and its performance characteristics determined by the reporting laboratory.  This laboratory is certified under the Clinical Laboratory Improvement Amendments CLIA as qualified to perform high complexity clinical laboratory testing.    CIPROFLOXACIN  Value in next row Sensitive      >=32 RESISTANTThis is a modified FDA-approved test that has been validated and its performance characteristics determined by the reporting laboratory.  This laboratory is certified under the Clinical Laboratory Improvement Amendments CLIA as qualified to perform high complexity clinical laboratory testing.    GENTAMICIN  Value in next row Sensitive      >=32 RESISTANTThis is a modified FDA-approved test that has been validated and its performance characteristics determined by the reporting laboratory.  This laboratory is certified under the Clinical Laboratory Improvement Amendments CLIA as qualified to perform high complexity clinical  laboratory testing.    NITROFURANTOIN Value in next row Sensitive      >=32 RESISTANTThis is a modified FDA-approved test that has been validated and its performance characteristics determined by the reporting laboratory.  This laboratory is certified under the Clinical Laboratory Improvement Amendments CLIA as qualified to perform high complexity clinical laboratory testing.    TRIMETH/SULFA Value in next row Resistant      >=32 RESISTANTThis is a modified FDA-approved test that has been validated and its performance characteristics determined by the reporting laboratory.  This laboratory is certified under the Clinical Laboratory Improvement Amendments CLIA as qualified to perform high complexity clinical laboratory testing.    AMPICILLIN/SULBACTAM Value in next row Sensitive      >=32 RESISTANTThis is a modified FDA-approved test that has been validated and its performance characteristics determined by the reporting laboratory.  This laboratory is certified under the Clinical Laboratory Improvement Amendments CLIA as qualified to perform high complexity clinical laboratory testing.    PIP/TAZO Value in next row Sensitive      <=4 SENSITIVEThis is a modified FDA-approved test that has been validated and its performance characteristics determined by the reporting laboratory.  This laboratory is certified under the Clinical Laboratory Improvement Amendments CLIA as qualified to perform high complexity clinical laboratory testing.    MEROPENEM Value in next row Sensitive      <=4 SENSITIVEThis is a modified FDA-approved test that has been validated and its performance characteristics determined by the reporting laboratory.  This laboratory is certified under the Clinical Laboratory Improvement Amendments CLIA as qualified to perform high complexity clinical laboratory testing.    * >=100,000 COLONIES/mL ESCHERICHIA  COLI    Labs: CBC: Recent Labs  Lab 01/18/24 0417  WBC 7.8  HGB 12.4*  HCT 38.1*   MCV 88.4  PLT 250   Basic Metabolic Panel: Recent Labs  Lab 01/18/24 0417 01/24/24 1341  NA 140  --   K 3.8  --   CL 106  --   CO2 24  --   GLUCOSE 185* 396*  BUN 10  --   CREATININE 0.63  --   CALCIUM  8.9  --    Liver Function Tests: No results for input(s): AST, ALT, ALKPHOS, BILITOT, PROT, ALBUMIN in the last 168 hours. CBG: Recent Labs  Lab 01/23/24 1616 01/23/24 2029 01/23/24 2118 01/24/24 0600 01/24/24 1146  GLUCAP 268* 356* 341* 289* 446*    Discharge time spent: greater than 30 minutes.  Signed: [name], MD [facility] 01/24/2024

## 2024-01-24 NOTE — H&P (Signed)
 Physical Medicine and Rehabilitation Admission H&P    Chief Complaint  Patient presents with   Functional deficits due to CVA    HPI: John [name] is a 78 year old R handed  male with PMHx of HTN, HLD, DM type 2, history of CVA, cognitive deficits, CAD, heart block s/p PPM, chronic HFrEF, provoked DVT (no longer anticoagulated), BPH, urinary retention with foley catheter, and recent admissions for UTI, bacteremia, pneumonia, nausea and vomiting, recently discharge to SNF who presented  presented to [facility] on 01/16/2024, with complaints of left-sided weakness. An initial CT scan of the head showed no acute abnormalities, and a chest X-ray was negative. Urinalysis was abnormal with a urine culture positive for ESBL E. Coli, and the patient received IV meropenem that was to transition to fosfomycin.   Initial lab results were significant for a glucose level of 164 mg/dL, procalcitonin of 534 ng/mL, WBC of 8.5 x 10^9/L, and hemoglobin of 10 g/dL. An EKG showed junctional rhythm with right bundle branch block. Neurology was consulted for further evaluation. The patient was transferred to [facility] on 01/18/2024. An MRI was positive for a right pontine infarct with a possible punctate petechial hemorrhage. A CTA of the head and neck showed severe right posterior cerebral artery stenosis. Neurology felt this was a lacunar infarct and recommended to continue aspirin  and Plavix  for 3 weeks then aspirin  alone.Patient continues with Foley catheter.  Prior to hospital admission, the patient required assistance with ADLs and had impaired functional cognition. The patient was residing in the [facility] skilled facility. Upon transfer, the patient required minimal assistance with ADLs and moderate assistance+2  for physical assistance for mobility and transfers, specifically needing manual assistance to stand from the edge of the bed and steadying with manual assistance for placement of the  left foot. Therapy evaluations completed due to patient decreased functional mobility was admitted for a comprehensive rehab program.   Pt reports Has foley-  sounds like chronic.  Pt reports has a BM 1x/week and not sure when had LBM, but denies constipation.   Denies Nausea/vomiting, numbness or tingling.     Review of Systems  Constitutional: Negative.   HENT: Negative.    Eyes: Negative.   Respiratory: Negative.    Cardiovascular: Negative.  Negative for leg swelling.  Gastrointestinal: Negative.  Negative for abdominal pain, constipation and nausea.       LBM 10/14  Genitourinary:  Negative for flank pain and hematuria.       Chronic foley   Musculoskeletal: Negative.  Negative for back pain and myalgias.  Skin: Negative.        Healing sacral wound   Neurological:  Positive for speech change, focal weakness and weakness. Negative for tingling and sensory change.  Endo/Heme/Allergies: Negative.   Psychiatric/Behavioral:  Positive for memory loss.   All other systems reviewed and are negative.  Past Medical History:  Diagnosis Date   BPH (benign prostatic hyperplasia)    CTS (carpal tunnel syndrome)    Depression    Diabetes mellitus    Essential hypertension    GERD (gastroesophageal reflux disease)    History of breast cancer    Hypercholesteremia    Neuropathy    Stroke [facility])    Past Surgical History:  Procedure Laterality Date   CATARACT EXTRACTION, BILATERAL  01/18/2017   CORONARY STENT INTERVENTION N/A 08/10/2023   Procedure: CORONARY STENT INTERVENTION;  Surgeon: Darron Deatrice LABOR, MD;  Location: [facility] INVASIVE CV LAB;  Service: Cardiovascular;  Laterality: N/A;   LEFT HEART CATH AND CORONARY ANGIOGRAPHY N/A 08/10/2023   Procedure: LEFT HEART CATH AND CORONARY ANGIOGRAPHY;  Surgeon: Darron Deatrice LABOR, MD;  Location: [facility] INVASIVE CV LAB;  Service: Cardiovascular;  Laterality: N/A;   LITHOTRIPSY     MASTECTOMY Right 05/02/2018   PACEMAKER IMPLANT N/A 05/13/2023    Procedure: PACEMAKER IMPLANT;  Surgeon: Kennyth Chew, MD;  Location: [facility] INVASIVE CV LAB;  Service: Cardiovascular;  Laterality: N/A;   TEMPORARY PACEMAKER N/A 05/11/2023   Procedure: TEMPORARY PACEMAKER;  Surgeon: Swazil[place], Peter M, MD;  Location: [facility] INVASIVE CV LAB;  Service: Cardiovascular;  Laterality: N/A;   TONSILLECTOMY AND ADENOIDECTOMY     TRANSESOPHAGEAL ECHOCARDIOGRAM (CATH LAB) N/A 12/08/2023   Procedure: TRANSESOPHAGEAL ECHOCARDIOGRAM;  Surgeon: Raford Riggs, MD;  Location: [facility] INVASIVE CV LAB;  Service: Cardiovascular;  Laterality: N/A;   Family History  Problem Relation Age of Onset   Lymphoma Mother    Cancer Mother    Diabetes Father    Stroke Father    Ovarian cancer Sister    Cancer Sister    Cancer Paternal Grandmother    Social History:  reports that he has never smoked. He has never used smokeless tobacco. He reports that he does not drink alcohol and does not use drugs. Allergies:  Allergies  Allergen Reactions   Ramipril Anaphylaxis   Adhesive [Tape] Rash   Other Diarrhea    Severe intolerance to Chemotherapy in the past.   Sertraline Other (See Comments)    Extreme headaches   Medications Prior to Admission  Medication Sig Dispense Refill   acetaminophen  (TYLENOL ) 325 MG tablet Take 2 tablets (650 mg total) by mouth every 6 (six) hours as needed for mild pain (pain score 1-3) or fever (or Fever >/= 101). (Patient taking differently: Take 650 mg by mouth every 8 (eight) hours as needed for mild pain (pain score 1-3) or fever (or Fever >/= 101).)     Ascorbic Acid  (VITAMIN C) 1000 MG tablet Take 1,000 mg by mouth 2 (two) times a week.     atorvastatin  (LIPITOR ) 80 MG tablet Take 1 tablet (80 mg total) by mouth at bedtime. 90 tablet 1   bisacodyl  (DULCOLAX) 10 MG suppository Place 10 mg rectally daily as needed for moderate constipation.     clopidogrel  (PLAVIX ) 75 MG tablet Take 1 tablet (75 mg total) by mouth daily with breakfast. 90 tablet 0    cyanocobalamin  1000 MCG tablet Take 1 tablet (1,000 mcg total) by mouth daily. 100 tablet 0   escitalopram  (LEXAPRO ) 10 MG tablet Take 1 tablet (10 mg total) by mouth daily. 90 tablet 1   ferrous sulfate  (FEROSUL) 325 (65 FE) MG tablet Take 1 tablet (325 mg total) by mouth daily with breakfast. 100 tablet 0   finasteride  (PROSCAR ) 5 MG tablet Take 1 tablet (5 mg total) by mouth daily. *Need appointment for future refills.* 30 tablet 0   insulin  glargine (LANTUS ) 100 UNIT/ML injection Inject 0.15 mLs (15 Units total) into the skin daily. 10 mL 11   losartan  (COZAAR ) 25 MG tablet Take 1 tablet (25 mg total) by mouth daily. 60 tablet 0   magnesium  hydroxide (MILK OF MAGNESIA) 400 MG/5ML suspension Take 30 mLs by mouth daily as needed for mild constipation.     metFORMIN  (GLUCOPHAGE ) 500 MG tablet Take 1 tablet (500 mg total) by mouth 2 (two) times daily with a meal. 60 tablet 0   Multiple Vitamin (MULTIVITAMIN) tablet Take 1 tablet  by mouth 2 (two) times a week.     ondansetron  (ZOFRAN -ODT) 4 MG disintegrating tablet Take 1 tablet (4 mg total) by mouth every 8 (eight) hours as needed for nausea or vomiting. 20 tablet 0   pantoprazole  (PROTONIX ) 40 MG tablet Take 1 tablet (40 mg total) by mouth at bedtime. 30 tablet 0   polyethylene glycol (MIRALAX  / GLYCOLAX ) 17 g packet Take 17 g by mouth daily.     PRESCRIPTION MEDICATION Place 1 suppository rectally daily as needed (Constipation).     senna-docusate (SENOKOT-S) 8.6-50 MG tablet Take 1 tablet by mouth 2 (two) times daily. 100 tablet 0   clotrimazole -betamethasone  (LOTRISONE ) cream Apply 1 Application topically daily. (Patient not taking: Reported on 01/17/2024) 30 g 0   hyoscyamine  (LEVSIN  SL) 0.125 MG SL tablet Place 1 tablet (0.125 mg total) under the tongue every 4 (four) hours as needed for bladder spasms. (Patient not taking: Reported on 01/17/2024) 20 tablet 0      Home: Home Living Family/patient expects to be discharged to:: Private  residence Living Arrangements: Spouse/significant other Available Help at Discharge: Family, Available 24 hours/day Type of Home: House Home Access: Stairs to enter [employer] of Steps: 4 Entrance Stairs-Rails: Can reach both (on stairs in back of the house) Home Layout: Able to live on main level with bedroom/bathroom (has bed in living room) Alternate Level Stairs-Number of Steps: He reported sleeping in a recliner on the main level of the home. All bedrooms are upstairs Alternate Level Stairs-Rails: Right, Left, Can reach both Bathroom Shower/Tub: Health visitor: Standard Bathroom Accessibility: Yes Home Equipment: Agricultural consultant (2 wheels), [employer] - single point, BSC/3in1, Wheelchair - power Additional Comments: pt was DC from SNF on day of adm 10/6   Functional History: Prior Function Prior Level of Function : Independent/Modified Independent Mobility Comments: was ambulating with RW x 20' prior to DC to [facility] 9/16 after T12 compr fx, unsure distance while in SNF ADLs Comments: unsure  Functional Status:  Mobility: Bed Mobility Overal bed mobility: Needs Assistance Bed Mobility: Supine to Sit Rolling: Min assist Sidelying to sit: Mod assist, +2 for safety/equipment, +2 for physical assistance, Used rails Supine to sit: Mod assist, HOB elevated, Used rails, +2 for physical assistance Sit to supine: HOB elevated, Used rails, Mod assist, +2 for physical assistance General bed mobility comments: Pt initiating bringing BLE's towards edge of bed, assist to execute and raise trunk up to sitting position. Use of rail Transfers Overall transfer level: Needs assistance Equipment used: Ambulation equipment used Transfers: Sit to/from Stand Sit to Stand: +2 physical assistance, Min assist Transfer via Lift Equipment: Stedy General transfer comment: MinA + 2 to power up to stand from edge of bed and stedy flaps, manual assist for placement of L foot, pt standing  with bilateral foot increased ER Ambulation/Gait General Gait Details: unable this date    ADL: ADL Overall ADL's : Needs assistance/impaired Eating/Feeding: Minimal assistance, Bed level Eating/Feeding Details (indicate cue type and reason): beverage and container/condiment mgmt; feeds self using R hand Grooming: Wash/dry hands, Wash/dry face, Minimal assistance, Standing Grooming Details (indicate cue type and reason): standing at sink in [place] Upper Body Bathing: Sitting, Cueing for compensatory techniques, Minimal assistance, Moderate assistance Upper Body Bathing Details (indicate cue type and reason): In supported sitting. Lower Body Bathing: Maximal assistance, Sitting/lateral leans, Cueing for compensatory techniques Lower Body Bathing Details (indicate cue type and reason): In supported sitting. Upper Body Dressing : Moderate assistance, Sitting Lower Body Dressing:  Cueing for compensatory techniques, Cueing for sequencing, Maximal assistance Lower Body Dressing Details (indicate cue type and reason): In supported sitting. Toilet Transfer: Maximal assistance, Cueing for sequencing, Cueing for safety, BSC/3in1, +2 for physical assistance Toileting- Clothing Manipulation and Hygiene: Total assistance Toileting - Clothing Manipulation Details (indicate cue type and reason): at bedside commode level, based on clinical judgement  Cognition: Cognition Orientation Level: Oriented to person, Oriented to place, Oriented to time, Disoriented to situation Cognition Arousal: Alert Behavior During Therapy: Flat affect  Physical Exam: Blood pressure (!) 134/58, pulse 78, temperature 98.2 F (36.8 C), temperature source Oral, resp. rate 15, SpO2 98%. Physical Exam Vitals and nursing note reviewed.  Constitutional:      Appearance: Normal appearance. He is normal weight.     Comments: Pt awake, alert, sitting up in bedside chair- finished 50% of tray, NAD   HENT:     Head:  Normocephalic and atraumatic.     Comments: No facial droop when smiles Tongue midline    Right Ear: External ear normal.     Left Ear: External ear normal.     Nose: Nose normal. No congestion.     Mouth/Throat:     Mouth: Mucous membranes are dry.     Pharynx: Oropharynx is clear. No oropharyngeal exudate.  Eyes:     General:        Right eye: No discharge.        Left eye: No discharge.     Extraocular Movements: Extraocular movements intact.  Cardiovascular:     Rate and Rhythm: Normal rate and regular rhythm.     Heart sounds: Normal heart sounds. No murmur heard.    No gallop.  Pulmonary:     Effort: Pulmonary effort is normal. No respiratory distress.     Breath sounds: Normal breath sounds. No wheezing, rhonchi or rales.  Abdominal:     Palpations: Abdomen is soft.     Comments: Hypoactive, slightly distended-  Genitourinary:    Comments: Foley in place- medium amber urine Musculoskeletal:        General: Normal range of motion.     Cervical back: Neck supple. No tenderness.     Comments: RUE 5/5 in biceps, triceps, WE, grip and FA LUE- 4-/5 in Biceps, triceps, grip and wouldn't/couldn't participate in WE/FA RLE_ HF/KE/KF/DF and PF 5/5- delayed responses LLE- HF 2/5; KE/KF 2+ to 3-/5 and DF/PF 3-/5    Skin:    General: Skin is warm and dry.  Neurological:     Mental Status: He is alert.     Comments: Able to tell me location- year and month- not date- didn't guess Intact to light touch in all 4 extremities and face  Psychiatric:     Comments: Slightly brighter than expected vague     Results for orders placed or performed during the hospital encounter of 01/16/24 (from the past 48 hours)  Glucose, capillary     Status: Abnormal   Collection Time: 01/22/24  4:28 PM  Result Value Ref Range   Glucose-Capillary 317 (H) 70 - 99 mg/dL    Comment: Glucose reference range applies only to samples taken after fasting for at least 8 hours.  Glucose, capillary      Status: Abnormal   Collection Time: 01/22/24  9:43 PM  Result Value Ref Range   Glucose-Capillary 221 (H) 70 - 99 mg/dL    Comment: Glucose reference range applies only to samples taken after fasting for at least 8 hours.  Glucose, capillary     Status: Abnormal   Collection Time: 01/23/24  6:28 AM  Result Value Ref Range   Glucose-Capillary 186 (H) 70 - 99 mg/dL    Comment: Glucose reference range applies only to samples taken after fasting for at least 8 hours.  Glucose, capillary     Status: Abnormal   Collection Time: 01/23/24 12:15 PM  Result Value Ref Range   Glucose-Capillary 272 (H) 70 - 99 mg/dL    Comment: Glucose reference range applies only to samples taken after fasting for at least 8 hours.  Glucose, capillary     Status: Abnormal   Collection Time: 01/23/24  4:16 PM  Result Value Ref Range   Glucose-Capillary 268 (H) 70 - 99 mg/dL    Comment: Glucose reference range applies only to samples taken after fasting for at least 8 hours.  Glucose, capillary     Status: Abnormal   Collection Time: 01/23/24  8:29 PM  Result Value Ref Range   Glucose-Capillary 356 (H) 70 - 99 mg/dL    Comment: Glucose reference range applies only to samples taken after fasting for at least 8 hours.  Glucose, capillary     Status: Abnormal   Collection Time: 01/23/24  9:18 PM  Result Value Ref Range   Glucose-Capillary 341 (H) 70 - 99 mg/dL    Comment: Glucose reference range applies only to samples taken after fasting for at least 8 hours.  Glucose, capillary     Status: Abnormal   Collection Time: 01/24/24  6:00 AM  Result Value Ref Range   Glucose-Capillary 289 (H) 70 - 99 mg/dL    Comment: Glucose reference range applies only to samples taken after fasting for at least 8 hours.  Glucose, capillary     Status: Abnormal   Collection Time: 01/24/24 11:46 AM  Result Value Ref Range   Glucose-Capillary 446 (H) 70 - 99 mg/dL    Comment: Glucose reference range applies only to samples taken  after fasting for at least 8 hours.  Glucose, random     Status: Abnormal   Collection Time: 01/24/24  1:41 PM  Result Value Ref Range   Glucose, Bld 396 (H) 70 - 99 mg/dL    Comment: Glucose reference range applies only to samples taken after fasting for at least 8 hours. Performed at [facility], [address], [place], [place] [address]    No results found.    Blood pressure (!) 134/58, pulse 78, temperature 98.2 F (36.8 C), temperature source Oral, resp. rate 15, SpO2 98%.  Medical Problem List and Plan: 1. Functional deficits secondary to acute pontine infarct  -patient may  shower  -ELOS/Goals: 9-12 days Min A to supervision  Admit to CIR- is appropriate 2.  Antithrombotics: -DVT/anticoagulation:  Pharmaceutical: Lovenox   -antiplatelet therapy: Asprin and Plavix  x 3 weeks then Aspirin  alone (ends 10/28).  3. Pain Management: Tylenol  as needed 4. Mood/Behavior/Sleep: LCSW to follow for evaluation and support when available.   -antipsychotic agents:  Lexapro  ---was on Lexapro  10 mg PTA, wife believes SNF was withholding medications and requests he resume use.   5. Neuropsych/cognition: This patient is not fully capable of making decisions on his own behalf. 6. Skin/Wound Care: Routine pressure relief measures  -Stage 2 Sacral wound--wound care orders placed--Gerhardt's butt cream +Zinc  PO 7. Fluids/Electrolytes/Nutrition: Monitor intake and output.  CBC/CMET in a.m.  - SLP consult  -Cardiac diet +Ensure, Zinc  and vitamin C  8.  Acute pontine infarct: Hx  of multiple lacunar infarcts -Continue aspirin  and Plavix  until 10/28 then aspirin  alone.  Atorvastatin  80 mg daily.  9.  CAD/HLD: Atorvastatin   9.  Acute cystitis: Urine culture ESBL E. Coli---Treated with meropenem then 1 dose of fosfomycin.  10.  Hx urinary retention/BPH: Foley catheter present--- continue to monitor. - I think based on chart it's long term?   11.  HTN: Resumed losartan  25 mg daily  12.   T2DM: A1c 7.5% continue CBGs ac/hs with SSI.  - Lantus  increased to 5 units twice daily--monitor for hypoglycemia 13.  Heart block: S/p PPM  14.  GERD: Continue PPI      Daphne LOISE Satterfield, NP 01/24/2024  I have personally performed a face to face diagnostic evaluation of this patient and formulated the key components of the plan.  Additionally, I have personally reviewed laboratory data, imaging studies, as well as relevant notes and concur with the physician assistant's documentation above.   The patient's status has not changed from the original H&P.  Any changes in documentation from the acute care chart have been noted above.

## 2024-01-25 DIAGNOSIS — I6350 Cerebral infarction due to unspecified occlusion or stenosis of unspecified cerebral artery: Secondary | ICD-10-CM | POA: Diagnosis not present

## 2024-01-25 LAB — CBC WITH DIFFERENTIAL/PLATELET
Abs Immature Granulocytes: 0.04 K/uL (ref 0.00–0.07)
Basophils Absolute: 0.1 K/uL (ref 0.0–0.1)
Basophils Relative: 1 %
Eosinophils Absolute: 0.2 K/uL (ref 0.0–0.5)
Eosinophils Relative: 3 %
HCT: 36.7 % — ABNORMAL LOW (ref 39.0–52.0)
Hemoglobin: 12.4 g/dL — ABNORMAL LOW (ref 13.0–17.0)
Immature Granulocytes: 1 %
Lymphocytes Relative: 30 %
Lymphs Abs: 2.5 K/uL (ref 0.7–4.0)
MCH: 29.6 pg (ref 26.0–34.0)
MCHC: 33.8 g/dL (ref 30.0–36.0)
MCV: 87.6 fL (ref 80.0–100.0)
Monocytes Absolute: 0.6 K/uL (ref 0.1–1.0)
Monocytes Relative: 7 %
Neutro Abs: 4.8 K/uL (ref 1.7–7.7)
Neutrophils Relative %: 58 %
Platelets: 233 K/uL (ref 150–400)
RBC: 4.19 MIL/uL — ABNORMAL LOW (ref 4.22–5.81)
RDW: 18.1 % — ABNORMAL HIGH (ref 11.5–15.5)
WBC: 8.1 K/uL (ref 4.0–10.5)
nRBC: 0 % (ref 0.0–0.2)

## 2024-01-25 LAB — COMPREHENSIVE METABOLIC PANEL WITH GFR
ALT: 23 U/L (ref 0–44)
AST: 22 U/L (ref 15–41)
Albumin: 2.8 g/dL — ABNORMAL LOW (ref 3.5–5.0)
Alkaline Phosphatase: 91 U/L (ref 38–126)
Anion gap: 9 (ref 5–15)
BUN: 24 mg/dL — ABNORMAL HIGH (ref 8–23)
CO2: 27 mmol/L (ref 22–32)
Calcium: 9.9 mg/dL (ref 8.9–10.3)
Chloride: 102 mmol/L (ref 98–111)
Creatinine, Ser: 0.84 mg/dL (ref 0.61–1.24)
GFR, Estimated: >60 mL/min (ref >60)
Glucose, Bld: 174 mg/dL — ABNORMAL HIGH (ref 70–99)
Potassium: 3.9 mmol/L (ref 3.5–5.1)
Sodium: 138 mmol/L (ref 135–145)
Total Bilirubin: 1.3 mg/dL — ABNORMAL HIGH (ref 0.0–1.2)
Total Protein: 5.5 g/dL — ABNORMAL LOW (ref 6.5–8.1)

## 2024-01-25 LAB — GLUCOSE, CAPILLARY
Glucose-Capillary: 172 mg/dL — ABNORMAL HIGH (ref 70–99)
Glucose-Capillary: 278 mg/dL — ABNORMAL HIGH (ref 70–99)
Glucose-Capillary: 324 mg/dL — ABNORMAL HIGH (ref 70–99)
Glucose-Capillary: 282 mg/dL — ABNORMAL HIGH (ref 70–99)

## 2024-01-25 MED ORDER — MODAFINIL 100 MG PO TABS
100.0000 mg | ORAL_TABLET | Freq: Every day | ORAL | Status: DC
  Administered 2024-01-26 – 2024-01-27 (×2): 100 mg via ORAL
  Filled 2024-01-25 (×2): qty 1

## 2024-01-25 MED ORDER — MODAFINIL 100 MG PO TABS: 200.0000 mg | ORAL_TABLET | Freq: Every day | ORAL | Status: DC

## 2024-01-25 MED ORDER — METFORMIN HCL 500 MG PO TABS
500.0000 mg | ORAL_TABLET | Freq: Two times a day (BID) | ORAL | Status: AC
Start: 2024-01-26 — End: ?
  Administered 2024-01-26 – 2024-01-28 (×5): 500 mg via ORAL
  Filled 2024-01-25 (×5): qty 1

## 2024-01-25 MED ORDER — INSULIN GLARGINE-YFGN 100 UNIT/ML ~~LOC~~ SOLN
10.0000 [IU] | Freq: Two times a day (BID) | SUBCUTANEOUS | Status: DC
  Administered 2024-01-26 (×2): 10 [IU] via SUBCUTANEOUS
  Filled 2024-01-25 (×4): qty 0.1

## 2024-01-25 MED ORDER — CHLORHEXIDINE GLUCONATE CLOTH 2 % EX PADS
6.0000 | MEDICATED_PAD | Freq: Two times a day (BID) | CUTANEOUS | Status: DC
  Administered 2024-01-25 – 2024-01-28 (×8): 6 via TOPICAL

## 2024-01-25 NOTE — Progress Notes (Signed)
 Occupational Therapy Note  Patient Details  Name: John [name] MRN: [id number] Date of Birth: 02/08/1946  Today's Date: 01/25/2024 OT Missed Time: 75 Minutes Missed Time Reason: Patient fatigue (pt again unable to wake up with multimodal cueing and sternal rubbing/hand tapping)  Upon OT arrival pt awake and alert although reporting fatigue. OT turned around to retrieve clothing for ADL tasks and pt asleep and would not wake up with multimodal cueing and sternal rubbing. Nsg notified and took vitals, WNL (see flowsheets for information). Will make up  missed time as allows.   Camie Hoe, OTD, OTR/L 01/25/2024, 4:07 PM

## 2024-01-25 NOTE — Plan of Care (Signed)
  Problem: RH Balance Goal: LTG: Patient will maintain dynamic sitting balance (OT) Description: LTG:  Patient will maintain dynamic sitting balance with assistance during activities of daily living (OT) Flowsheets (Taken 01/25/2024 1631) LTG: Pt will maintain dynamic sitting balance during ADLs with: Independent with assistive device   Problem: RH Grooming Goal: LTG Patient will perform grooming w/assist,cues/equip (OT) Description: LTG: Patient will perform grooming with assist, with/without cues using equipment (OT) Flowsheets (Taken 01/25/2024 1631) LTG: Pt will perform grooming with assistance level of: Supervision/Verbal cueing   Problem: RH Bathing Goal: LTG Patient will bathe all body parts with assist levels (OT) Description: LTG: Patient will bathe all body parts with assist levels (OT) Flowsheets (Taken 01/25/2024 1631) LTG: Pt will perform bathing with assistance level/cueing: Supervision/Verbal cueing   Problem: RH Dressing Goal: LTG Patient will perform upper body dressing (OT) Description: LTG Patient will perform upper body dressing with assist, with/without cues (OT). Flowsheets (Taken 01/25/2024 1631) LTG: Pt will perform upper body dressing with assistance level of: Supervision/Verbal cueing Goal: LTG Patient will perform lower body dressing w/assist (OT) Description: LTG: Patient will perform lower body dressing with assist, with/without cues in positioning using equipment (OT) Flowsheets (Taken 01/25/2024 1631) LTG: Pt will perform lower body dressing with assistance level of: Supervision/Verbal cueing   Problem: RH Toileting Goal: LTG Patient will perform toileting task (3/3 steps) with assistance level (OT) Description: LTG: Patient will perform toileting task (3/3 steps) with assistance level (OT)  Flowsheets (Taken 01/25/2024 1631) LTG: Pt will perform toileting task (3/3 steps) with assistance level: Supervision/Verbal cueing   Problem: RH Toilet  Transfers Goal: LTG Patient will perform toilet transfers w/assist (OT) Description: LTG: Patient will perform toilet transfers with assist, with/without cues using equipment (OT) Flowsheets (Taken 01/25/2024 1631) LTG: Pt will perform toilet transfers with assistance level of: Supervision/Verbal cueing   Problem: RH Tub/Shower Transfers Goal: LTG Patient will perform tub/shower transfers w/assist (OT) Description: LTG: Patient will perform tub/shower transfers with assist, with/without cues using equipment (OT) Flowsheets (Taken 01/25/2024 1631) LTG: Pt will perform tub/shower stall transfers with assistance level of: Supervision/Verbal cueing   Problem: RH Pre-functional/Other (Specify) Goal: RH LTG OT (Specify) 1 Description: RH LTG OT (Specify) 1 Flowsheets (Taken 01/25/2024 1631) LTG: Other OT (Specify) 1: Pt will be able to use LUE as non-dominant/stabilizer while compoleting ADL activities at SBA

## 2024-01-25 NOTE — Progress Notes (Signed)
 Inpatient Rehabilitation  Patient information reviewed and entered into eRehab system by [facility]. Karen Kays., CCC/SLP, PPS Coordinator.  Information including medical coding, functional ability and quality indicators will be reviewed and updated through discharge.

## 2024-01-25 NOTE — Evaluation (Signed)
 Occupational Therapy Assessment and Plan  Patient Details  Name: John [name] MRN: [id number] Date of Birth: [birth year]  OT Diagnosis: abnormal posture, altered mental status, cognitive deficits, hemiplegia affecting non-dominant side, and muscle weakness (generalized) Rehab Potential: Rehab Potential (ACUTE ONLY): Good ELOS: 2-3 weeks   Today's Date: 01/25/2024 OT Individual Time: 9199-9159 OT Individual Time Calculation (min): 40 min   and Today's Date: 01/25/2024 OT Missed Time: 20 Minutes Missed Time Reason: Patient fatigue (unable to wake up pt with raised voice and multimodal cueing as well as sternal rubbing)    Hospital Problem: Principal Problem:   Right pontine stroke [facility])   Past Medical History:  Past Medical History:  Diagnosis Date   BPH (benign prostatic hyperplasia)    CTS (carpal tunnel syndrome)    Depression    Diabetes mellitus    Essential hypertension    GERD (gastroesophageal reflux disease)    History of breast cancer    Hypercholesteremia    Neuropathy    Stroke [facility])    Past Surgical History:  Past Surgical History:  Procedure Laterality Date   CATARACT EXTRACTION, BILATERAL  01/18/2017   CORONARY STENT INTERVENTION N/A 08/10/2023   Procedure: CORONARY STENT INTERVENTION;  Surgeon: Darron Deatrice LABOR, MD;  Location: [facility] INVASIVE CV LAB;  Service: Cardiovascular;  Laterality: N/A;   LEFT HEART CATH AND CORONARY ANGIOGRAPHY N/A 08/10/2023   Procedure: LEFT HEART CATH AND CORONARY ANGIOGRAPHY;  Surgeon: Darron Deatrice LABOR, MD;  Location: [facility] INVASIVE CV LAB;  Service: Cardiovascular;  Laterality: N/A;   LITHOTRIPSY     MASTECTOMY Right 05/02/2018   PACEMAKER IMPLANT N/A 05/13/2023   Procedure: PACEMAKER IMPLANT;  Surgeon: Kennyth Chew, MD;  Location: [facility] INVASIVE CV LAB;  Service: Cardiovascular;  Laterality: N/A;   TEMPORARY PACEMAKER N/A 05/11/2023   Procedure: TEMPORARY PACEMAKER;  Surgeon: [place], Peter M, MD;  Location: [facility] INVASIVE CV LAB;   Service: Cardiovascular;  Laterality: N/A;   TONSILLECTOMY AND ADENOIDECTOMY     TRANSESOPHAGEAL ECHOCARDIOGRAM (CATH LAB) N/A 12/08/2023   Procedure: TRANSESOPHAGEAL ECHOCARDIOGRAM;  Surgeon: Raford Riggs, MD;  Location: [facility] INVASIVE CV LAB;  Service: Cardiovascular;  Laterality: N/A;    Assessment & Plan Clinical Impression: John [name] is a 78 year old R handed  male with PMHx of HTN, HLD, DM type 2, history of CVA, cognitive deficits, CAD, heart block s/p PPM, chronic HFrEF, provoked DVT (no longer anticoagulated), BPH, urinary retention with foley catheter, and recent admissions for UTI, bacteremia, pneumonia, nausea and vomiting, recently discharge to SNF who presented  presented to [facility] on 01/16/2024, with complaints of left-sided weakness. An initial CT scan of the head showed no acute abnormalities, and a chest X-ray was negative. Urinalysis was abnormal with a urine culture positive for ESBL E. Coli, and the patient received IV meropenem that was to transition to fosfomycin.   Initial lab results were significant for a glucose level of 164 mg/dL, procalcitonin of 534 ng/mL, WBC of 8.5 x 10^9/L, and hemoglobin of 10 g/dL. An EKG showed junctional rhythm with right bundle branch block. Neurology was consulted for further evaluation. The patient was transferred to [facility] on 01/18/2024. An MRI was positive for a right pontine infarct with a possible punctate petechial hemorrhage. A CTA of the head and neck showed severe right posterior cerebral artery stenosis. Neurology felt this was a lacunar infarct and recommended to continue aspirin  and Plavix  for 3 weeks then aspirin  alone.Patient continues with Foley catheter.   Prior to hospital  admission, the patient required assistance with ADLs and had impaired functional cognition. The patient was residing in the [facility] skilled facility. Upon transfer, the patient required minimal assistance with ADLs and moderate  assistance+2  for physical assistance for mobility and transfers, specifically needing manual assistance to stand from the edge of the bed and steadying with manual assistance for placement of the left foot. Therapy evaluations completed due to patient decreased functional mobility was admitted for a comprehensive rehab program.    Pt reports Has foley-  sounds like chronic.  Pt reports has a BM 1x/week and not sure when had LBM, but denies constipation.    Denies Nausea/vomiting, numbness or tingling. .  Patient transferred to CIR on 01/24/2024 .    Patient currently requires total with basic self-care skills secondary to muscle weakness, decreased cardiorespiratoy endurance, impaired timing and sequencing and decreased coordination, decreased initiation and delayed processing, and decreased sitting balance, decreased standing balance, decreased postural control, hemiplegia, and decreased balance strategies.  Prior to hospitalization, patient could complete ADLs with independent . Prior to this hospital stay, pt in SNF receiving rehab from   Patient will benefit from skilled intervention to decrease level of assist with basic self-care skills and increase independence with basic self-care skills prior to discharge home with care partner.  Anticipate patient will require 24 hour supervision and follow up home health.  OT - End of Session Activity Tolerance: Tolerates 30+ min activity with multiple rests Endurance Deficit: Yes OT Assessment Rehab Potential (ACUTE ONLY): Good OT Barriers to Discharge: None OT Patient demonstrates impairments in the following area(s): Balance;Safety;Sensory;Skin Integrity;Endurance;Motor OT Basic ADL's Functional Problem(s): Grooming;Bathing;Dressing;Toileting OT Advanced ADL's Functional Problem(s): None OT Transfers Functional Problem(s): Toilet;Tub/Shower OT Additional Impairment(s): Fuctional Use of Upper Extremity OT Plan OT Intensity: Minimum of 1-2 x/day,  45 to 90 minutes OT Frequency: 5 out of 7 days OT Treatment/Interventions: Balance/vestibular training;Discharge planning;Functional electrical stimulation;Pain management;Self Care/advanced ADL retraining;Therapeutic Activities;UE/LE Coordination activities;Cognitive remediation/compensation;Disease mangement/prevention;Functional mobility training;Patient/family education;Therapeutic Exercise;Skin care/wound managment;Visual/perceptual remediation/compensation;Community reintegration;DME/adaptive equipment instruction;Neuromuscular re-education;Psychosocial support;Splinting/orthotics;UE/LE Strength taining/ROM;Wheelchair propulsion/positioning OT Recommendation Recommendations for Other Services: Speech consult;Therapeutic Recreation consult Therapeutic Recreation Interventions: Pet therapy Patient destination: Home Follow Up Recommendations: Home health OT;24 hour supervision/assistance Equipment Recommended: To be determined   OT Evaluation Precautions/Restrictions  Precautions Precautions: Fall Restrictions Weight Bearing Restrictions Per Provider Order: No Other Position/Activity Restrictions: L sided weakness General Chart Reviewed: Yes Response to Previous Treatment: Not applicable Family/Caregiver Present: No Pain Pain Assessment Pain Scale: 0-10 Pain Score: 0-No pain Home Living/Prior Functioning Home Living Family/patient expects to be discharged to:: Private residence Living Arrangements: Spouse/significant other Available Help at Discharge: Family, Available 24 hours/day Type of Home: House Home Access: Stairs to enter [employer] of Steps: 4 Entrance Stairs-Rails: Can reach both Home Layout: Able to live on main level with bedroom/bathroom, Multi-level Alternate Level Stairs-Number of Steps: He reported sleeping in a recliner on the main level of the home. All bedrooms are upstairs Alternate Level Stairs-Rails: Right, Left, Can reach both Bathroom  Shower/Tub: Health visitor: Standard Bathroom Accessibility: Yes Additional Comments: pt was DC from SNF on day of adm 10/6  Lives With: Spouse IADL History Homemaking Responsibilities: No Current License: Yes Mode of Transportation: Set designer Occupation: Retired Prior Function Level of Independence: Independent with basic ADLs  Able to Take Stairs?: Yes Driving: Yes (before SNF) Vocation: Retired Administrator, sports Baseline Vision/History: 1 Wears glasses Wears Glasses: Reading only Ability to See in Adequate Light: 0 Adequate Patient Visual Report: No change  from baseline Vision Assessment?: No apparent visual deficits Perception  Perception: Within Functional Limits Praxis Praxis: WFL Cognition Cognition Overall Cognitive Status: Impaired/Different from baseline Arousal/Alertness: Awake/alert Orientation Level: Person;Situation Memory: Appears intact Awareness: Impaired Awareness Impairment: Anticipatory impairment Problem Solving: Impaired Safety/Judgment: Appears intact Brief Interview for Mental Status (BIMS) Repetition of Three Words (First Attempt): 3 Temporal Orientation: Year: Missed by 1 year Temporal Orientation: Month: Accurate within 5 days Temporal Orientation: Day: Incorrect Recall: Sock: No, could not recall Recall: Blue: Yes, no cue required Recall: Bed: Yes, no cue required BIMS Summary Score: 11 Sensation Sensation Light Touch: Impaired by gross assessment Hot/Cold: Appears Intact Stereognosis: Not tested Coordination Gross Motor Movements are Fluid and Coordinated: No Fine Motor Movements are Fluid and Coordinated: No Coordination and Movement Description: Lt hemi Motor  Motor Motor: Hemiplegia Motor - Skilled Clinical Observations: Lt hemi, delayed movements, increased time for movement on Lt side  Trunk/Postural Assessment  Cervical Assessment Cervical Assessment: Exceptions to [facility] (forward head) Thoracic Assessment Thoracic  Assessment: Exceptions to [facility] (rounded shoulders) Lumbar Assessment Lumbar Assessment: Exceptions to [facility] (posterior pelvic tilt) Postural Control Postural Control: Deficits on evaluation Righting Reactions: delayed Protective Responses: delayed  Balance Balance Balance Assessed: Yes Dynamic Sitting Balance Sitting balance - Comments: Posterior and right lateral lean Extremity/Trunk Assessment RUE Assessment RUE Assessment: Within Functional Limits General Strength Comments: 4-/5 overall LUE Assessment LUE Assessment: Exceptions to [place] Passive Range of Motion (PROM) Comments: WFL Active Range of Motion (AROM) Comments: delayed movements- shoulder- 120*, bicep- WFL, wrist ~2-* extension, WNL flexion, can complete fingers to thumb with increased time General Strength Comments: shoulder 3/5, bicep 3+/5, tricep 3/5, grip 4-/5  Care Tool Care Tool Self Care Eating   Eating Assist Level: Supervision/Verbal cueing    Oral Care    Oral Care Assist Level: Minimal Assistance - Patient > 75%    Bathing         Assist Level: Maximal Assistance - Patient 24 - 49%    Upper Body Dressing(including orthotics)       Assist Level: Maximal Assistance - Patient 25 - 49%    Lower Body Dressing (excluding footwear)     Assist for lower body dressing: Dependent - Patient 0%    Putting on/Taking off footwear     Assist for footwear: Dependent - Patient 0%       Care Tool Toileting Toileting activity   Assist for toileting: 2 Helpers     Care Tool Bed Mobility Roll left and right activity   Roll left and right assist level: Total Assistance - Patient < 25%    Sit to lying activity   Sit to lying assist level: Total Assistance - Patient < 25%    Lying to sitting on side of bed activity   Lying to sitting on side of bed assist level: the ability to move from lying on the back to sitting on the side of the bed with no back support.: Total Assistance - Patient < 25%     Care Tool  Transfers Sit to stand transfer Sit to stand activity did not occur: Safety/medical concerns      Chair/bed transfer Chair/bed transfer activity did not occur: Safety/medical concerns       Toilet transfer Toilet transfer activity did not occur: Refused       Care Tool Cognition  Expression of Ideas and Wants Expression of Ideas and Wants: 2. Frequent difficulty - frequently exhibits difficulty with expressing needs and ideas  Understanding Verbal and  Non-Verbal Content Understanding Verbal and Non-Verbal Content: 3. Usually understands - understands most conversations, but misses some part/intent of message. Requires cues at times to understand   Memory/Recall Ability Memory/Recall Ability : That he or she is in a hospital/hospital unit;Current season   Refer to Care Plan for Long Term Goals  SHORT TERM GOAL WEEK 1 OT Short Term Goal 1 (Week 1): Pt will complete LB dressing at Max A with AE as necessary OT Short Term Goal 2 (Week 1): Pt will complete toilet transfer at Max A with LRAD OT Short Term Goal 3 (Week 1): Pt will use Lt hand as stabilizer during ADL activities with Mod VCs  Recommendations for other services: Neuropsych and Therapeutic Recreation  Pet therapy   Skilled Therapeutic Intervention ADL ADL Eating: Set up Grooming: Minimal assistance;Moderate cueing Where Assessed-Grooming: Bed level Upper Body Bathing: Maximal assistance (clinical judgement) Lower Body Bathing: Maximal assistance (clinical judgement) Upper Body Dressing: Maximal assistance Lower Body Dressing: Dependent (clinical judgement) Where Assessed-Lower Body Dressing: Bed level Toileting: Not assessed Tub/Shower Transfer: Not assessed Walk-In Shower Transfer: Not assessed ADL Comments: limited eval d/t limited pt participation and extreme fatigue Mobility  Bed Mobility Bed Mobility: Rolling Right;Sit to Supine;Left Sidelying to Sit;Supine to Sit;Scooting to [facility] Rolling Right: 2 Helpers Left  Sidelying to Sit: 2 Helpers Supine to Sit: 2 Helpers Sit to Supine: 2 Helpers Scooting to [facility]: 2 Helpers Transfers Sit to Stand: 2 Helpers  1:1 evaluation and treatment session initiated this date. OT roles, goals and purpose discussed with pt as well as therapy schedule. ADL completed this date with levels of assist listed above. Pt would benefit from skilled OT in IPR setting in order to maximize independence with ADLs upon D/C.   Discharge Criteria: Patient will be discharged from OT if patient refuses treatment 3 consecutive times without medical reason, if treatment goals not met, if there is a change in medical status, if patient makes no progress towards goals or if patient is discharged from hospital.  The above assessment, treatment plan, treatment alternatives and goals were discussed and mutually agreed upon: by patient  Camie Hoe, OTD, OTR/L 01/25/2024, 4:07 PM

## 2024-01-25 NOTE — Progress Notes (Addendum)
 Inpatient Rehabilitation Admission Medication Review by a Pharmacist  A complete drug regimen review was completed for this patient to identify any potential clinically significant medication issues.  High Risk Drug Classes Is patient taking? Indication by Medication  Antipsychotic No   Anticoagulant Yes Enoxaparin  - VTE prophylaxis  Antibiotic No   Opioid No   Antiplatelet Yes ASA - CVA Clopidogrel  until 10/28 - CVA  Hypoglycemics/insulin  Yes Insulin  aspart - DM Insulin  glargine - DM  Vasoactive Medication Yes Losartan  - HTN, HFrEF  Chemotherapy No   Other Yes APAP prn pain Maalox prn indigestion Vitamin C, Zinc  - supplement Atorvastatin  - CAD, HLD Docusate, Senna, Fleet - bowel regimen Escitalopram  - mood Melatonin prn sleep Ondansetron  prn nausea Pantoprazole  - GERD     Type of Medication Issue Identified Description of Issue Recommendation(s)  Drug Interaction(s) (clinically significant)     Duplicate Therapy     Allergy     No Medication Administration End Date     Incorrect Dose     Additional Drug Therapy Needed     Significant med changes from prior encounter (inform family/care partners about these prior to discharge). PTA meds not restarted at this time include:  Cyanocobalamin , Ferrous Sulfate , Finasteride , Metformin , MVI, Miralax  Restart Finasteride  5mg  PO daily -- other agents can likely wait until discharge from CIR  Other       Clinically significant medication issues were identified that warrant physician communication and completion of prescribed/recommended actions by midnight of the next day:  No  Name of provider notified for urgent issues identified:   Provider Method of Notification:     Pharmacist comments:   Time spent performing this drug regimen review (minutes):  15   Hammons, Suzen Acre 01/24/2024 8:04 PM

## 2024-01-25 NOTE — Progress Notes (Signed)
 Visited patient today. Will come back when family is available to give education.

## 2024-01-25 NOTE — Progress Notes (Signed)
 Patient ID: John [name], male   DOB: [birth year], 78 y.o.   MRN: [id number]  SW attempted to complete assessment but he would not use words only head nods. SW shared will follow-up with his wife and he says Bye to SW. SW will make efforts to perform assessment another day.   1352- SW spoke with pt wife [name] to introduce self, explain role, and discuss discharge process. She would like for him to be as independent as possible since it is only the two of them. She shares concerns about some observed behaviors in which he becomes upset with her when she tells him she has to leave. SW shared her concerns will be brought to team to get their insight. SW will follow-up with updates as available.   [name], MSW, LCSW Office: [phone] Cell: [phone] Fax: [phone]

## 2024-01-25 NOTE — Evaluation (Signed)
 Physical Therapy Assessment and Plan  Patient Details  Name: John [name] MRN: [id number] Date of Birth: 1945-12-16  PT Diagnosis: Abnormal posture, Abnormality of gait, Difficulty walking, Hemiplegia non-dominant, Muscle weakness, Pain in joint, and Paralysis Rehab Potential: Good ELOS: 10-14 days   Today's Date: 01/25/2024 PT Individual Time: 0909-1006 PT Individual Time Calculation (min): 57 min    Hospital Problem: Principal Problem:   Right pontine stroke [facility])   Past Medical History:  Past Medical History:  Diagnosis Date   BPH (benign prostatic hyperplasia)    CTS (carpal tunnel syndrome)    Depression    Diabetes mellitus    Essential hypertension    GERD (gastroesophageal reflux disease)    History of breast cancer    Hypercholesteremia    Neuropathy    Stroke [facility])    Past Surgical History:  Past Surgical History:  Procedure Laterality Date   CATARACT EXTRACTION, BILATERAL  01/18/2017   CORONARY STENT INTERVENTION N/A 08/10/2023   Procedure: CORONARY STENT INTERVENTION;  Surgeon: Darron Deatrice LABOR, MD;  Location: [facility] INVASIVE CV LAB;  Service: Cardiovascular;  Laterality: N/A;   LEFT HEART CATH AND CORONARY ANGIOGRAPHY N/A 08/10/2023   Procedure: LEFT HEART CATH AND CORONARY ANGIOGRAPHY;  Surgeon: Darron Deatrice LABOR, MD;  Location: [facility] INVASIVE CV LAB;  Service: Cardiovascular;  Laterality: N/A;   LITHOTRIPSY     MASTECTOMY Right 05/02/2018   PACEMAKER IMPLANT N/A 05/13/2023   Procedure: PACEMAKER IMPLANT;  Surgeon: Kennyth Chew, MD;  Location: [facility] INVASIVE CV LAB;  Service: Cardiovascular;  Laterality: N/A;   TEMPORARY PACEMAKER N/A 05/11/2023   Procedure: TEMPORARY PACEMAKER;  Surgeon: [place], Peter M, MD;  Location: [facility] INVASIVE CV LAB;  Service: Cardiovascular;  Laterality: N/A;   TONSILLECTOMY AND ADENOIDECTOMY     TRANSESOPHAGEAL ECHOCARDIOGRAM (CATH LAB) N/A 12/08/2023   Procedure: TRANSESOPHAGEAL ECHOCARDIOGRAM;  Surgeon: Raford Riggs, MD;   Location: [facility] INVASIVE CV LAB;  Service: Cardiovascular;  Laterality: N/A;    Assessment & Plan Clinical Impression: Patient is a 78 year old R handed  male with PMHx of HTN, HLD, DM type 2, history of CVA, cognitive deficits, CAD, heart block s/p PPM, chronic HFrEF, provoked DVT (no longer anticoagulated), BPH, urinary retention with foley catheter, and recent admissions for UTI, bacteremia, pneumonia, nausea and vomiting, recently discharge to SNF who presented  presented to [facility] on 01/16/[facility]24, with complaints of left-sided weakness. An initial CT scan of the head showed no acute abnormalities, and a chest X-ray was negative. Urinalysis was abnormal with a urine culture positive for ESBL E. Coli, and the patient received IV meropenem that was to transition to fosfomycin.   Initial lab results were significant for a glucose level of 164 mg/dL, procalcitonin of 534 ng/mL, WBC of 8.5 x 10^9/L, and hemoglobin of 10 g/dL. An EKG showed junctional rhythm with right bundle branch block. Neurology was consulted for further evaluation. The patient was transferred to [facility] on 01/18/2024. An MRI was positive for a right pontine infarct with a possible punctate petechial hemorrhage. A CTA of the head and neck showed severe right posterior cerebral artery stenosis. Neurology felt this was a lacunar infarct and recommended to continue aspirin  and Plavix  for 3 weeks then aspirin  alone.Patient continues with Foley catheter.   Prior to hospital admission, the patient required assistance with ADLs and had impaired functional cognition. The patient was residing in the [facility] skilled facility. Upon transfer, the patient required minimal assistance with ADLs and moderate assistance+2  for physical  assistance for mobility and transfers, specifically needing manual assistance to stand from the edge of the bed and steadying with manual assistance for placement of the left foot. Therapy evaluations  completed due to patient decreased functional mobility was admitted for a comprehensive rehab program. Patient transferred to CIR on 01/24/2024 .   Patient currently requires max with mobility secondary to muscle weakness and muscle paralysis and abnormal tone, unbalanced muscle activation, and decreased coordination.  Prior to hospitalization, patient was independent  with mobility and lived with Spouse in a House home.  Home access is 4Stairs to enter.  Patient will benefit from skilled PT intervention to maximize safe functional mobility, minimize fall risk, and decrease caregiver burden for planned discharge home with 24 hour supervision.  Anticipate patient will benefit from follow up HH at discharge.  PT - End of Session Activity Tolerance: Decreased this session Endurance Deficit: Yes PT Assessment Rehab Potential (ACUTE/IP ONLY): Good PT Barriers to Discharge: None PT Patient demonstrates impairments in the following area(s): Balance;Endurance;Motor;Pain;Safety;Skin Integrity;Sensory PT Transfers Functional Problem(s): Car;Bed Mobility;Bed to Chair PT Locomotion Functional Problem(s): Ambulation;Stairs;Wheelchair Mobility PT Plan PT Intensity: Minimum of 1-2 x/day ,45 to 90 minutes PT Frequency: 5 out of 7 days PT Duration Estimated Length of Stay: 10-14 days PT Treatment/Interventions: Ambulation/gait training;Community reintegration;DME/adaptive equipment instruction;Neuromuscular re-education;Psychosocial support;Stair training;UE/LE Strength taining/ROM;Wheelchair propulsion/positioning;UE/LE Coordination activities;Therapeutic Activities;Skin care/wound management;Pain management;Functional electrical stimulation;Discharge planning;Balance/vestibular training;Cognitive remediation/compensation;Disease management/prevention;Functional mobility training;Patient/family education;Splinting/orthotics;Therapeutic Exercise PT Transfers Anticipated Outcome(s): Mod I PT Locomotion  Anticipated Outcome(s): Supervision PT Recommendation Recommendations for Other Services: Speech consult (memory) Follow Up Recommendations: Home health PT;Outpatient PT Patient destination: Home Equipment Recommended: To be determined   PT Evaluation Precautions/Restrictions Precautions Precautions: Fall Recall of Precautions/Restrictions: Impaired Precaution/Restrictions Comments: L hemiparesis Restrictions Weight Bearing Restrictions Per Provider Order: Yes Other Position/Activity Restrictions: L sided weakness General PT Missed Treatment Reason: Other (Comment) (+2 for safety) Vital Signs  Pain Pain Assessment Pain Scale: 0-10 Pain Score: 0-No pain Pain Interference Pain Interference Pain Effect on Sleep: 1. Rarely or not at all Pain Interference with Therapy Activities: 1. Rarely or not at all Pain Interference with Day-to-Day Activities: 1. Rarely or not at all Home Living/Prior Functioning Home Living Available Help at Discharge: Family;Available 24 hours/day Type of Home: House Home Access: Stairs to enter [employer] of Steps: 4 Entrance Stairs-Rails: Can reach both Home Layout: Able to live on main level with bedroom/bathroom;Multi-level Alternate Level Stairs-Number of Steps: He reported sleeping in a recliner on the main level of the home. All bedrooms are upstairs Alternate Level Stairs-Rails: Right;Left;Can reach both Bathroom Shower/Tub: Health visitor: Standard Bathroom Accessibility: Yes Additional Comments: pt was DC from SNF on day of adm 10/6  Lives With: Spouse Prior Function Level of Independence: Independent with basic ADLs  Able to Take Stairs?: Yes Driving: Yes (stopped ~ 7 months ago) Vocation: Retired Vision/Perception  Vision - History Ability to See in Adequate Light: 0 Adequate Perception Perception: Within Functional Limits Praxis Praxis: WFL  Cognition Overall Cognitive Status: Impaired/Different from  baseline Arousal/Alertness: Awake/alert Orientation Level: Oriented to person;Oriented to place;Disoriented to time;Disoriented to situation Memory: Appears intact Awareness: Impaired Awareness Impairment: Anticipatory impairment Problem Solving: Appears intact Safety/Judgment: Appears intact Sensation Sensation Light Touch: Impaired by gross assessment Hot/Cold: Appears Intact Proprioception: Appears Intact Stereognosis: Not tested Coordination Gross Motor Movements are Fluid and Coordinated: No Fine Motor Movements are Fluid and Coordinated: No Coordination and Movement Description: Lt hemi Motor  Motor Motor: Hemiplegia Motor - Skilled Clinical Observations: Lt  hemi, delayed movements, increased time for movement on Lt side   Trunk/Postural Assessment  Cervical Assessment Cervical Assessment: Exceptions to [facility] (forward head) Thoracic Assessment Thoracic Assessment: Exceptions to [facility] (rounded shoulders) Lumbar Assessment Lumbar Assessment: Exceptions to [facility] Postural Control Postural Control: Deficits on evaluation (post pelvic tilt) Trunk Control: fair (-) Righting Reactions: delayed Protective Responses: delayed  Balance Dynamic Sitting Balance Sitting balance - Comments: Posterior and right lateral lean Extremity Assessment  RUE Assessment RUE Assessment: Within Functional Limits General Strength Comments: 4-/5 overall LUE Assessment LUE Assessment: Exceptions to [facility] Passive Range of Motion (PROM) Comments: WFL Active Range of Motion (AROM) Comments: delayed movements- shoulder- 120*, bicep- WFL, wrist ~2-* extension, WNL flexion, can complete fingers to thumb with increased time General Strength Comments: shoulder 3/5, bicep 3+/5, tricep 3/5, grip 4-/5 RLE Assessment Active Range of Motion (AROM) Comments: WNLs RLE Strength Right Hip Flexion: 4/5 Right Hip Extension: 4-/5 Right Hip ABduction: 4+/5 Right Hip ADduction: 4/5 Right Knee Flexion: 5/5 Right Knee  Extension: 5/5 Right Ankle Dorsiflexion: 5/5 Right Ankle Plantar Flexion: 5/5 LLE Assessment Passive Range of Motion (PROM) Comments: WFL, tight heel cord LLE Strength Left Hip Flexion: 3+/5 Left Hip Extension: 3-/5 Left Hip ABduction: 4-/5 Left Hip ADduction: 3+/5 Left Knee Flexion: 3-/5 Left Knee Extension: 3/5 Left Ankle Dorsiflexion: 3-/5 Left Ankle Plantar Flexion: 3+/5  Care Tool Care Tool Bed Mobility Roll left and right activity   Roll left and right assist level: Total Assistance - Patient < 25%    Sit to lying activity   Sit to lying assist level: Total Assistance - Patient < 25%    Lying to sitting on side of bed activity   Lying to sitting on side of bed assist level: the ability to move from lying on the back to sitting on the side of the bed with no back support.: Total Assistance - Patient < 25%     Care Tool Transfers Sit to stand transfer Sit to stand activity did not occur: Safety/medical concerns (unsafe due to weakness)      Chair/bed transfer Chair/bed transfer activity did not occur: Safety/medical concerns (unsafe due to weakness)      Car transfer Car transfer activity did not occur: Environmental limitations (unsafe due to weakness)        Care Tool Locomotion Ambulation Ambulation activity did not occur: Safety/medical concerns (unsafe due to weakness)        Walk 10 feet activity Walk 10 feet activity did not occur: Safety/medical concerns (unsafe due to weakness)       Walk 50 feet with 2 turns activity Walk 50 feet with 2 turns activity did not occur: Safety/medical concerns (unsafe due to weakness)      Walk 150 feet activity Walk 150 feet activity did not occur: Safety/medical concerns (unsafe due to weakness)      Walk 10 feet on uneven surfaces activity Walk 10 feet on uneven surfaces activity did not occur: Safety/medical concerns (unsafe due to weakness)      Stairs Stair activity did not occur: Safety/medical concerns (unsafe  due to weakness)        Walk up/down 1 step activity Walk up/down 1 step or curb (drop down) activity did not occur: Safety/medical concerns (unsafe due to weakness)      Walk up/down 4 steps activity Walk up/down 4 steps activity did not occur: Safety/medical concerns (unsafe due to weakness)      Walk up/down 12 steps activity Walk up/down 12 steps activity  did not occur: Safety/medical concerns (unsafe due to weakness)      Pick up small objects from floor Pick up small object from the floor (from standing position) activity did not occur: Safety/medical concerns (unsafe due to weakness)      Wheelchair     Wheelchair activity did not occur: Safety/medical concerns (unsafe due to weakness)      Wheel 50 feet with 2 turns activity Wheelchair 50 feet with 2 turns activity did not occur: Safety/medical concerns (unsafe due to weakness)    Wheel 150 feet activity Wheelchair 150 feet activity did not occur: Safety/medical concerns (unsafe due to weakness)      Refer to Care Plan for Long Term Goals  SHORT TERM GOAL WEEK 1    Recommendations for other services: Neuropsych and Therapeutic Recreation  Stress management and Outing/community reintegration  Skilled Therapeutic Intervention Mobility Bed Mobility Bed Mobility: Rolling Right;Sit to Supine;Left Sidelying to Sit;Supine to Sit;Scooting to [facility] Rolling Right: 2 Helpers Left Sidelying to Sit: 2 Helpers Supine to Sit: 2 Helpers Sit to Supine: 2 Helpers Scooting to [facility]: 2 Helpers Locomotion    Skilled Therapeutic Intervention Evaluation completed (see details above and below) with education on PT POC and goals and individual treatment initiated with focus on endurance, transfers, balance, family ed, strengthening and progress to gait. Pt in bed when PT arrived, pt not speaking much initially, then able to have conversation.  Sit EOB pt requires x2 ppl for trunk/LE placement/control.  Once seated, pt is min/mod A for trunk  stability. Pt attempts with use RUE to support.  Unable to stand due to limited help.  Due to fatigue, pt went back into bed with max A x2, scooting to [facility] with total A x2.   Discharge Criteria: Patient will be discharged from PT if patient refuses treatment 3 consecutive times without medical reason, if treatment goals not met, if there is a change in medical status, if patient makes no progress towards goals or if patient is discharged from hospital.  The above assessment, treatment plan, treatment alternatives and goals were discussed and mutually agreed upon: by patient  [name] Fast 01/25/2024, 10:20 AM

## 2024-01-25 NOTE — Plan of Care (Signed)
  Problem: Consults Goal: RH STROKE PATIENT EDUCATION Description: See Patient Education module for education specifics  Outcome: Progressing Goal: Nutrition Consult-if indicated Outcome: Progressing Goal: Diabetes Guidelines if Diabetic/Glucose > 140 Description: If diabetic or lab glucose is > 140 mg/dl - Initiate Diabetes/Hyperglycemia Guidelines & Document Interventions  Outcome: Progressing   Problem: RH BOWEL ELIMINATION Goal: RH STG MANAGE BOWEL WITH ASSISTANCE Description: STG Manage Bowel with mod I Assistance. Outcome: Progressing Goal: RH STG MANAGE BOWEL W/MEDICATION W/ASSISTANCE Description: STG Manage Bowel with Medication with mod I Assistance. Outcome: Progressing   Problem: RH BLADDER ELIMINATION Goal: RH STG MANAGE BLADDER WITH ASSISTANCE Description: STG Manage Bladder With min Assistance Outcome: Progressing Goal: RH STG MANAGE BLADDER WITH MEDICATION WITH ASSISTANCE Description: STG Manage Bladder With Medication With mod I Assistance. Outcome: Progressing Goal: RH STG MANAGE BLADDER WITH EQUIPMENT WITH ASSISTANCE Description: STG Manage Bladder With Equipment With min Assistance Outcome: Progressing   Problem: RH SKIN INTEGRITY Goal: RH STG SKIN FREE OF INFECTION/BREAKDOWN Description: Manage skin w min assist Outcome: Progressing Goal: RH STG MAINTAIN SKIN INTEGRITY WITH ASSISTANCE Description: STG Maintain Skin Integrity With min Assistance. Outcome: Progressing Goal: RH STG ABLE TO PERFORM INCISION/WOUND CARE W/ASSISTANCE Description: STG Able To Perform Incision/Wound Care With min Assistance. Outcome: Progressing   Problem: RH SAFETY Goal: RH STG ADHERE TO SAFETY PRECAUTIONS W/ASSISTANCE/DEVICE Description: STG Adhere to Safety Precautions With cues Assistance/Device. Outcome: Progressing   Problem: RH PAIN MANAGEMENT Goal: RH STG PAIN MANAGED AT OR BELOW PT'S PAIN GOAL Description: Pain < 4 with prns Outcome: Progressing   Problem: RH  KNOWLEDGE DEFICIT Goal: RH STG INCREASE KNOWLEDGE OF DIABETES Description: Patient and wife will be able to manage DM using educational resources for medication and dietary modification independently Outcome: Progressing Goal: RH STG INCREASE KNOWLEDGE OF HYPERTENSION Description: Patient and wife will be able to manage HTN using educational resources for medication and dietary modification independently Outcome: Progressing Goal: RH STG INCREASE KNOWLEGDE OF HYPERLIPIDEMIA Description: Patient and wife will be able to manage HLD using educational resources for medication and dietary modification independently Outcome: Progressing Goal: RH STG INCREASE KNOWLEDGE OF STROKE PROPHYLAXIS Description: Patient and wife will be able to manage secondary risks using educational resources for medication and dietary modification independently Outcome: Progressing

## 2024-01-25 NOTE — Progress Notes (Addendum)
 Cornelio Bouchard, MD  Physician Physical Medicine and Rehabilitation   PMR Pre-admission    Signed   Date of Service: 01/24/2024 12:28 PM  Related encounter: ED to Hosp-Admission (Discharged) from 01/16/2024 in [facility] [place] [facility]   Signed     Expand All Collapse All  PMR Admission Coordinator Pre-Admission Assessment   Patient: John [name] is an 78 y.o., male MRN: [id number] DOB: 1945/10/04 Height:   Weight:     Insurance Information HMO:     PPO: yes     PCP:      IPA:      80/20:      OTHER:  PRIMARY: BCBS Medicare      Policy#: [id number]      Subscriber: patient CM Name: Clotilda      Phone#: [phone]     Fax#: [phone] Pre-Cert#: [id number] auth for CIR from Clotilda with [facility] Medicare for admit 10/14 with next review date 10/21.  Updates due to [facility] at fax listed above.        Employer:  Benefits:  Phone #:      Name:  Eff. Date: 04/13/23     Deduct: $0      Out of Pocket Max: $5900 (met)      Life Max: n/a CIR: $335/day for days 1-5      SNF: 20 full days  Outpatient:      Co-Pay: $10/visit Home Health: 100%      Co-Pay:  DME: 80%     Co-Pay: 20% Providers: in-network SECONDARY:       Policy#:      Phone#:    Artist:       Phone#:    The Data processing manager" for patients in Inpatient Rehabilitation Facilities with attached "Privacy Act Statement-Health Care Records" was provided and verbally reviewed with: Patient and Family   Emergency Contact Information Contact Information       Name Relation Home Work Mobile    [place] Spouse [phone]   [phone]    Oluwaseun, Cremer     [phone]         Other Contacts   None on File        Current Medical History  Patient Admitting Diagnosis: CVA History of Present Illness: Pt is a 78 year old male with medical hx significant for: HTN, hyperlipidemai, DM II, h/o CVA, cognitive deficits, CAD, previous DVT, urinary retention with Foley catheter in  place, PNA. Pt recently admitted to hospital from September 15-22 d/t intractable nausea and vomiting. Imaging showed basal ganglia lacunar infarcts. Pt discharged to [facility]. Pt discharged from [facility] on 01/16/24. Pt presented to [facility] on 01/16/24 d/t left-sided weakness. CT head negative for acute abnormalities. Chest x-ray negative. Abnormal urinalysis. Urine culture grew ESBL E.Coli. Pt started on Rocephin . Neurology consulted. Pt transferred to [facility] on 01/18/24. MRI was positive for right pontine infarct with possible punctate petechial hemorrhage. 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 Therapy evaluations completed and CIR recommended d/t pt's deficits in functional mobility.  --Complete NIHSS TOTAL: 3   Patient's medical record from [facility] has been reviewed by the rehabilitation admission coordinator and physician.   Past Medical History      Past Medical History:  Diagnosis Date   BPH (benign prostatic hyperplasia)     CTS (carpal tunnel syndrome)     Depression     Diabetes mellitus     Essential hypertension  GERD (gastroesophageal reflux disease)     History of breast cancer     Hypercholesteremia     Neuropathy     Stroke [facility])            Has the patient had major surgery during 100 days prior to admission? No   Family History   family history includes Cancer in his mother, paternal grandmother, and sister; Diabetes in his father; Lymphoma in his mother; Ovarian cancer in his sister; Stroke in his father.   Current Medications  Current Medications    Current Facility-Administered Medications:    0.9 %  sodium chloride  infusion, , Intravenous, Continuous, Drusilla, Sabas RAMAN, MD, Last Rate: 10 mL/hr at 01/20/24 1515, Rate Change at 01/20/24 1515   acetaminophen  (TYLENOL ) tablet 650 mg, 650 mg, Oral, Q6H PRN, 650 mg at 01/22/24 0936 **OR** acetaminophen  (TYLENOL ) suppository 650 mg, 650 mg, Rectal, Q6H PRN,  Lou Claretta HERO, MD   aspirin  EC tablet 81 mg, 81 mg, Oral, Daily, Khaliqdina, Salman, MD, 81 mg at 01/24/24 1003   atorvastatin  (LIPITOR ) tablet 80 mg, 80 mg, Oral, QHS, Amponsah, Prosper M, MD, 80 mg at 01/23/24 2039   bisacodyl  (DULCOLAX) EC tablet 5 mg, 5 mg, Oral, Daily PRN, Lou Claretta HERO, MD   Chlorhexidine  Gluconate Cloth 2 % PADS 6 each, 6 each, Topical, Daily, Drusilla, Sabas RAMAN, MD, 6 each at 01/24/24 1000   clopidogrel  (PLAVIX ) tablet 75 mg, 75 mg, Oral, Q breakfast, Lou Claretta HERO, MD, 75 mg at 01/24/24 9097   enoxaparin  (LOVENOX ) injection 40 mg, 40 mg, Subcutaneous, Q24H, Amponsah, Claretta HERO, MD, 40 mg at 01/24/24 1002   Gerhardt's butt cream, , Topical, BID, Drusilla, Sabas RAMAN, MD, Given at 01/24/24 1003   insulin  aspart (novoLOG ) injection 0-9 Units, 0-9 Units, Subcutaneous, TID WC, Amponsah, Prosper M, MD, 5 Units at 01/24/24 0604   insulin  aspart (novoLOG ) injection 3 Units, 3 Units, Subcutaneous, TID WC, Drusilla Sabas RAMAN, MD, 3 Units at 01/24/24 1345   insulin  glargine (LANTUS ) injection 5 Units, 5 Units, Subcutaneous, BID, Lama, Sabas RAMAN, MD, 5 Units at 01/24/24 1004   losartan  (COZAAR ) tablet 25 mg, 25 mg, Oral, Daily, Drusilla, Gagan S, MD, 25 mg at 01/24/24 1003   melatonin tablet 6 mg, 6 mg, Oral, QHS PRN, Segars, Jonathan, MD, 6 mg at 01/22/24 2256   ondansetron  (ZOFRAN ) tablet 4 mg, 4 mg, Oral, Q6H PRN **OR** ondansetron  (ZOFRAN ) injection 4 mg, 4 mg, Intravenous, Q6H PRN, Lou Claretta HERO, MD   pantoprazole  (PROTONIX ) EC tablet 40 mg, 40 mg, Oral, QHS, Lou Claretta HERO, MD, 40 mg at 01/23/24 2039   senna-docusate (Senokot-S) tablet 1 tablet, 1 tablet, Oral, QHS PRN, Lou Claretta HERO, MD     Patients Current Diet:  Diet Order                  Diet Heart Room service appropriate? Yes; Fluid consistency: Thin  Diet effective now                         Precautions / Restrictions Precautions Precautions: Fall Precaution/Restrictions Comments: L  hemiparesis Restrictions Weight Bearing Restrictions Per Provider Order: No Other Position/Activity Restrictions: L sided weakness    Has the patient had 2 or more falls or a fall with injury in the past year? Yes   Prior Activity Level Limited Community (1-2x/wk): MD appointments   Prior Functional Level Self Care: Did the patient need help bathing, dressing, using the  toilet or eating? Needed some help   Indoor Mobility: Did the patient need assistance with walking from room to room (with or without device)? Independent   Stairs: Did the patient need assistance with internal or external stairs (with or without device)? Independent   Functional Cognition: Did the patient need help planning regular tasks such as shopping or remembering to take medications? Needed some help   Patient Information Are you of Hispanic, Latino/a,or Spanish origin?: A. No, not of Hispanic, Latino/a, or Spanish origin What is your race?: A. White Do you need or want an interpreter to communicate with a doctor or health care staff?: 0. No   Patient's Response To:  Health Literacy and Transportation Is the patient able to respond to health literacy and transportation needs?: Yes Health Literacy - How often do you need to have someone help you when you read instructions, pamphlets, or other written material from your doctor or pharmacy?: Never In the past 12 months, has lack of transportation kept you from medical appointments or from getting medications?: No In the past 12 months, has lack of transportation kept you from meetings, work, or from getting things needed for daily living?: No   Home Assistive Devices / Equipment Home Equipment: Agricultural consultant (2 wheels), [place] - single point, BSC/3in1, Wheelchair - power   Prior Device Use: Indicate devices/aids used by the patient prior to current illness, exacerbation or injury? Walker   Current Functional Level Cognition   Orientation Level: Oriented to  person, Oriented to place, Disoriented to situation, Disoriented to time    Extremity Assessment (includes Sensation/Coordination)   Upper Extremity Assessment: Defer to OT evaluation RUE Deficits / Details: AROM WFL. Gross strength 4+/5. Digit opposition WFL LUE Deficits / Details: prefers to use RUE, reduced dexterity and general strength in entire LUE LUE Sensation: decreased light touch LUE Coordination: decreased fine motor, decreased gross motor  Lower Extremity Assessment: LLE deficits/detail RLE Deficits / Details: WFL LLE Deficits / Details: slow movements, decreased control LLE Sensation: decreased light touch LLE Coordination: decreased gross motor     ADLs   Overall ADL's : Needs assistance/impaired Eating/Feeding: Minimal assistance, Bed level Eating/Feeding Details (indicate cue type and reason): beverage and container/condiment mgmt; feeds self using R hand Grooming: Wash/dry hands, Wash/dry face, Minimal assistance, Standing Grooming Details (indicate cue type and reason): standing at sink in [place] Upper Body Bathing: Sitting, Cueing for compensatory techniques, Minimal assistance, Moderate assistance Upper Body Bathing Details (indicate cue type and reason): In supported sitting. Lower Body Bathing: Maximal assistance, Sitting/lateral leans, Cueing for compensatory techniques Lower Body Bathing Details (indicate cue type and reason): In supported sitting. Upper Body Dressing : Moderate assistance, Sitting Lower Body Dressing: Cueing for compensatory techniques, Cueing for sequencing, Maximal assistance Lower Body Dressing Details (indicate cue type and reason): In supported sitting. Toilet Transfer: Maximal assistance, Cueing for sequencing, Cueing for safety, BSC/3in1, +2 for physical assistance Toileting- Clothing Manipulation and Hygiene: Total assistance Toileting - Clothing Manipulation Details (indicate cue type and reason): at bedside commode level, based on  clinical judgement     Mobility   Overal bed mobility: Needs Assistance Bed Mobility: Supine to Sit Rolling: Min assist Sidelying to sit: Mod assist, +2 for safety/equipment, +2 for physical assistance, Used rails Supine to sit: Mod assist, HOB elevated, Used rails, +2 for physical assistance Sit to supine: HOB elevated, Used rails, Mod assist, +2 for physical assistance General bed mobility comments: Pt initiating bringing BLE's towards edge of bed, assist to execute  and raise trunk up to sitting position. Use of rail     Transfers   Overall transfer level: Needs assistance Equipment used: Ambulation equipment used Transfers: Sit to/from Stand Sit to Stand: +2 physical assistance, Min assist Transfer via Lift Equipment: Stedy General transfer comment: MinA + 2 to power up to stand from edge of bed and stedy flaps, manual assist for placement of L foot, pt standing with bilateral foot increased ER     Ambulation / Gait / Stairs / Wheelchair Mobility   Ambulation/Gait General Gait Details: unable this date     Posture / Balance Dynamic Sitting Balance Sitting balance - Comments: Posterior and left lateral lean. Progressing to CGA-minA with bilateral hand support on chair positioned in front of him Balance Overall balance assessment: Needs assistance Sitting-balance support: Feet supported, Bilateral upper extremity supported Sitting balance-Leahy Scale: Poor Sitting balance - Comments: Posterior and left lateral lean. Progressing to CGA-minA with bilateral hand support on chair positioned in front of him Postural control: Left lateral lean Standing balance support: Bilateral upper extremity supported Standing balance-Leahy Scale: Poor Standing balance comment: left lateral lean in standing, reliant on ModAx2     Special considerations/life events  Skin Ecchymosis: sacrum/bilateral; Erythema/Redness: buttocks/bilateral; Petechiae: abdomen/bilateral; Stage 2 Pressure Injury/sacrum,  Urethral Catheter and Diabetic management Novolog  0-9 units 3x daily with meals, Lantus  5 units daily at bedtime    Previous Home Environment (from acute therapy documentation) Living Arrangements: Spouse/significant other Available Help at Discharge: Family, Available 24 hours/day Type of Home: House Home Layout: Able to live on main level with bedroom/bathroom (has bed in living room) Alternate Level Stairs-Rails: Right, Left, Can reach both Alternate Level Stairs-Number of Steps: He reported sleeping in a recliner on the main level of the home. All bedrooms are upstairs Home Access: Stairs to enter Entrance Stairs-Rails: Can reach both (on stairs in back of the house) Entrance Stairs-Number of Steps: 4 Bathroom Shower/Tub: Health visitor: Standard Bathroom Accessibility: Yes How Accessible: Accessible via walker Additional Comments: pt was DC from SNF on day of adm 10/6   Discharge Living Setting Plans for Discharge Living Setting: Patient's home Type of Home at Discharge: House Discharge Home Layout: Able to live on main level with bedroom/bathroom (has bed in living room) Discharge Home Access: Stairs to enter Entrance Stairs-Rails: Can reach both (on stairs in back) Entrance Stairs-Number of Steps: 4 Discharge Bathroom Shower/Tub: Walk-in shower Discharge Bathroom Toilet: Standard Discharge Bathroom Accessibility: Yes How Accessible: Accessible via walker Does the patient have any problems obtaining your medications?: No   Social/Family/Support Systems Patient Roles: Spouse Anticipated Caregiver: Maxon Kresse, wife Anticipated Caregiver's Contact Information: [phone] Caregiver Availability: 24/7 Discharge Plan Discussed with Primary Caregiver: Yes Is Caregiver In Agreement with Plan?: Yes Does Caregiver/Family have Issues with Lodging/Transportation while Pt is in Rehab?: No   Goals Patient/Family Goal for Rehab: PT/OT/SLP supervision to mod  I Expected length of stay: 9-12 days Pt/Family Agrees to Admission and willing to participate: Yes Program Orientation Provided & Reviewed with Pt/Caregiver Including Roles  & Responsibilities: Yes   Decrease burden of Care through IP rehab admission: NA   Possible need for SNF placement upon discharge: Not anticipated   Patient Condition: This patient's condition remains as documented in the consult dated 10/13, in which the Rehabilitation Physician determined and documented that the patient's condition is appropriate for intensive rehabilitative care in an inpatient rehabilitation facility. Will admit to inpatient rehab today.   Preadmission Screen Completed By:  Tinnie SQUIBB  Yvone Cohens and Reche Lowers 01/24/2024 2:34 PM ______________________________________________________________________   Discussed status with Dr. Lovorn on 01/24/24  at 2:34 PM  and received approval for admission today.   Admission Coordinator:  Tinnie SHAUNNA Yvone Cohens, CCC-SLP, and Reche Lowers, PT, DPT time 2:34 PM Pattricia 01/24/24     Assessment/Plan: Diagnosis:   R pontine infarct Does the need for close, 24 hr/day Medical supervision in concert with the patient's rehab needs make it unreasonable for this patient to be served in a less intensive setting? Yes Co-Morbidities requiring supervision/potential complications:  CAD, heart block, dCHF, DM A1c 7.5; foley chronic Due to bladder management, bowel management, safety, skin/wound care, disease management, medication administration, pain management, and patient education, does the patient require 24 hr/day rehab nursing? Yes Does the patient require coordinated care of a physician, rehab nurse, PT, OT, and SLP to address physical and functional deficits in the context of the above medical diagnosis(es)? Yes Addressing deficits in the following areas: balance, endurance, locomotion, strength, transferring, bowel/bladder control, bathing, dressing, feeding,  grooming, and toileting Can the patient actively participate in an intensive therapy program of at least 3 hrs of therapy 5 days a week? Yes The potential for patient to make measurable gains while on inpatient rehab is good Anticipated functional outcomes upon discharge from inpatient rehab: modified independent and supervision PT, modified independent and supervision OT, n/a SLP Estimated rehab length of stay to reach the above functional goals is: 9-12 days Anticipated discharge destination: Home 10. Overall Rehab/Functional Prognosis: good     MD Signature:            Revision History

## 2024-01-25 NOTE — Progress Notes (Addendum)
 PROGRESS NOTE   Subjective/Complaints:  A.m. labs stable.  No events overnight. Patient refuses to engage provider on a.m. exam.  Pretends to be asleep with all questions, stimulation.  Noted by therapies to be mostly tired/difficult to arouse throughout today, then intermittently when awake emotionally labile.   ROS: Positives per HPI above.    Objective:   No results found. Recent Labs    01/25/24 0523  WBC 8.1  HGB 12.4*  HCT 36.7*  PLT 233   Recent Labs    01/24/24 1341 01/25/24 0523  NA  --  138  K  --  3.9  CL  --  102  CO2  --  27  GLUCOSE 396* 174*  BUN  --  24*  CREATININE  --  0.84  CALCIUM   --  9.9    Intake/Output Summary (Last 24 hours) at 01/25/2024 2247 Last data filed at 01/25/2024 1800 Gross per 24 hour  Intake 350 ml  Output 1075 ml  Net -725 ml        Physical Exam: Vital Signs Blood pressure 136/72, pulse 80, temperature 97.8 F (36.6 C), temperature source Oral, resp. rate 16, height 6' (1.829 m), weight 80 kg, SpO2 96%. Constitutional: No apparent distress. Appropriate appearance for age.  Asleep. HENT: No JVD. Neck Supple. Trachea midline. Atraumatic, normocephalic. Cardiovascular: RRR, no murmurs/rub/gallops. No Edema. Peripheral pulses 2+  Respiratory: CTAB. No rales, rhonchi, or wheezing. On RA.  Abdomen: + bowel sounds, normoactive. No distention  GU: Not examined. +Foley, draining clear urine.  Skin: C/D/I. No apparent lesions.  MSK:      No apparent deformity.   Neurologic exam:  Asleep on exam, refuses to respond to provider.  Does not answer questions or verbalize. Response to light touch in all 4 extremities Strength: Moving all 4 extremities in bed.   Negative Hoffmann's, negative Babinski Not cooperative with remainder of exam  Prior exams: RUE 5/5 in biceps, triceps, WE, grip and FA LUE- 4-/5 in Biceps, triceps, grip and wouldn't/couldn't participate in  WE/FA RLE_ HF/KE/KF/DF and PF 5/5- delayed responses LLE- HF 2/5; KE/KF 2+ to 3-/5 and DF/PF 3-/5       Assessment/Plan: 1. Functional deficits which require 3+ hours per day of interdisciplinary therapy in a comprehensive inpatient rehab setting. Physiatrist is providing close team supervision and 24 hour management of active medical problems listed below. Physiatrist and rehab team continue to assess barriers to discharge/monitor patient progress toward functional and medical goals  Care Tool:  Bathing              Bathing assist Assist Level: Maximal Assistance - Patient 24 - 49%     Upper Body Dressing/Undressing Upper body dressing        Upper body assist Assist Level: Maximal Assistance - Patient 25 - 49%    Lower Body Dressing/Undressing Lower body dressing            Lower body assist Assist for lower body dressing: Dependent - Patient 0%     Toileting Toileting    Toileting assist Assist for toileting: 2 Helpers     Transfers Chair/bed transfer  Transfers assist  Chair/bed transfer activity  did not occur: Safety/medical concerns        Locomotion Ambulation   Ambulation assist   Ambulation activity did not occur: Safety/medical concerns (unsafe due to weakness)          Walk 10 feet activity   Assist  Walk 10 feet activity did not occur: Safety/medical concerns (unsafe due to weakness)        Walk 50 feet activity   Assist Walk 50 feet with 2 turns activity did not occur: Safety/medical concerns (unsafe due to weakness)         Walk 150 feet activity   Assist Walk 150 feet activity did not occur: Safety/medical concerns (unsafe due to weakness)         Walk 10 feet on uneven surface  activity   Assist Walk 10 feet on uneven surfaces activity did not occur: Safety/medical concerns (unsafe due to weakness)         Wheelchair     Assist     Wheelchair activity did not occur: Safety/medical concerns  (unsafe due to weakness)         Wheelchair 50 feet with 2 turns activity    Assist    Wheelchair 50 feet with 2 turns activity did not occur: Safety/medical concerns (unsafe due to weakness)       Wheelchair 150 feet activity     Assist  Wheelchair 150 feet activity did not occur: Safety/medical concerns (unsafe due to weakness)       Blood pressure 136/72, pulse 80, temperature 97.8 F (36.6 C), temperature source Oral, resp. rate 16, height 6' (1.829 m), weight 80 kg, SpO2 96%.  Medical Problem List and Plan: 1. Functional deficits secondary to acute pontine infarct             -patient may  shower             -ELOS/Goals: 9-12 days Min A to supervision             -Stable to continue CIR  2.  Antithrombotics: -DVT/anticoagulation:  Pharmaceutical: Lovenox              -antiplatelet therapy: Asprin and Plavix  x 3 weeks then Aspirin  alone (ends 10/28).  3. Pain Management: Tylenol  as needed  4. Mood/Behavior/Sleep: LCSW to follow for evaluation and support when available.              -antipsychotic agents:  Lexapro  ---was on Lexapro  10 mg PTA, wife believes SNF was withholding medications and requests he resume use.    - 10-15: Difficult to arouse, somnolent with intermittently emotionally labile throughout the day.  Continue Lexapro  as above.  Start adjunctive modafinil 100 mg for 3 days, then increase to 200 mg daily.  Starting sleep log.  5. Neuropsych/cognition: This patient is not fully capable of making decisions on his own behalf.   - 10-15: Starting Provigil as above  6. Skin/Wound Care: Routine pressure relief measures             -Stage 2 Sacral wound--wound care orders placed--Gerhardt's butt cream +Zinc  PO    7. Fluids/Electrolytes/Nutrition: Monitor intake and output.  CBC/CMET in a.m.             - SLP consult             -Cardiac diet +Ensure, Zinc  and vitamin C    - 10-15: Albumin 2.8.  Dietary to assess.  8.  Acute pontine infarct: Hx of  multiple lacunar infarcts -Continue aspirin   and Plavix  until 10/28 then aspirin  alone.  Atorvastatin  80 mg daily.   9.  CAD/HLD: Atorvastatin    9.  Acute cystitis: Urine culture ESBL E. Coli---Treated with meropenem then 1 dose of fosfomycin.   10.  Hx urinary retention/BPH: Foley catheter present--- continue to monitor. -Chronic since 07/2023   11.  HTN: Resumed losartan  25 mg daily    - Monitor vitals with initiation of therapies    01/25/2024    8:54 PM 01/25/2024    2:44 PM 01/25/2024    2:26 PM  Vitals with BMI  Systolic 136 130 880  Diastolic 72 63 59  Pulse 80 70 69     12.  T2DM: A1c 7.5% continue CBGs ac/hs with SSI.  On chart review from 11-2023, Home regimen was metformin  500 mg twice daily, Lantus  10 units every morning/15 units every afternoon.             - Lantus  increased to 5 units twice daily--monitor for hypoglycemia Recent Labs    01/25/24 1129 01/25/24 1756 01/25/24 2110  GLUCAP 278* 324* 282*      - 10-15: Blood sugars very elevated today.  Increase Lantus  to 10 units twice daily.  Resume metformin  500 mg twice daily  13.  Heart block: S/p PPM   14.  GERD: Continue PPI    15. Constipation?- Pt didn't remember LBM- was earlier this AM- medium type 6- incontinent    LOS: 1 days A FACE TO FACE EVALUATION WAS PERFORMED  John [name] 01/25/2024, 10:47 PM

## 2024-01-25 NOTE — Progress Notes (Signed)
 Babs Arthea DASEN, MD  Physician Physical Medicine and Rehabilitation   Consult Note    Signed   Date of Service: 01/23/2024  9:58 AM  Related encounter: ED to Hosp-Admission (Discharged) from 01/16/2024 in [facility]   Signed     Expand All Collapse All           Physical Medicine and Rehabilitation Consult Reason for Consult:left hp Referring Physician: Drusilla     HPI: John [name] is a 78 y.o. 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  CTA of the head and neck demonstrated severe posterior cerebral artery stenosis.  MRI of the brain revealed acute right pontine infarct.  Neurology felt this was a lacunar infarct and recommended to continue aspirin  and Plavix  for 3 weeks then changing to aspirin  alone.  Patient with acute cystitis with urine culture growing out ESBL E. coli for which the patient was started on meropenem and eventually transition to fosfomycin.  Patient continues with Foley catheter.  He has been working with therapies and is min assist for sit to stand transfers but does not appear to have walked yet.  He has been min assist for very basic ADLs.  Patient was modified independent with rolling walker prior to his prior hospitalization and transfer to SNF.  He lives at home with his wife in a two-level home with 4 steps to enter.  Apparently they can stay on the first floor.       Home: Home Living Family/patient expects to be discharged to:: Private residence Living Arrangements: Spouse/significant other Available Help at Discharge: Family, Available 24 hours/day Type of Home: House Home Access: Stairs to enter [employer] of Steps: 4 Entrance Stairs-Rails: Can reach both (on stairs in back of the house) Home Layout: Able to live on main level with  bedroom/bathroom (has bed in living room) Alternate Level Stairs-Number of Steps: He reported sleeping in a recliner on the main level of the home. All bedrooms are upstairs Alternate Level Stairs-Rails: Right, Left, Can reach both Bathroom Shower/Tub: Health visitor: Standard Bathroom Accessibility: Yes Home Equipment: Agricultural consultant (2 wheels), [employer] - single point, BSC/3in1, Wheelchair - power Additional Comments: pt was DC from SNF on day of adm 10/6  Functional History: Prior Function Prior Level of Function : Independent/Modified Independent Mobility Comments: was ambulating with RW x 20' prior to DC to [facility] 9/16 after T12 compr fx, unsure distance while in SNF ADLs Comments: unsure Functional Status:  Mobility: Bed Mobility Overal bed mobility: Needs Assistance Bed Mobility: Rolling, Sidelying to Sit, Sit to Supine Rolling: Mod assist, Used rails Sidelying to sit: Mod assist, +2 for safety/equipment, +2 for physical assistance, Used rails Supine to sit: Max assist, HOB elevated, Used rails Sit to supine: Mod assist, +2 for physical assistance, +2 for safety/equipment General bed mobility comments: patient requiring mod assist +2 due to assistance needed with trunk and BLEs, education provided on rail use Transfers Overall transfer level: Needs assistance Equipment used: Ambulation equipment used Transfers: Sit to/from Stand Sit to Stand: Via lift equipment, Mod assist, +2 physical assistance, +2 safety/equipment, Min assist Transfer via Lift Equipment: St[place] General transfer comment: sit to stand from EOB with mod assist +2 and able to stand from [place] pads with min assist Ambulation/Gait General Gait Details: unable this date  ADL: ADL Overall ADL's : Needs assistance/impaired Eating/Feeding: Minimal assistance, Sitting Eating/Feeding Details (indicate cue type and reason): In supported sitting. Assist anticipated to be needed for cutting food, removing  lids and opening containers/packets, given LUE weakness and impaired coordination. Grooming: Wash/dry hands, Wash/dry face, Minimal assistance, Standing Grooming Details (indicate cue type and reason): standing at sink in [place] Upper Body Bathing: Sitting, Cueing for compensatory techniques, Minimal assistance, Moderate assistance Upper Body Bathing Details (indicate cue type and reason): In supported sitting. Lower Body Bathing: Maximal assistance, Sitting/lateral leans, Cueing for compensatory techniques Lower Body Bathing Details (indicate cue type and reason): In supported sitting. Upper Body Dressing : Moderate assistance, Sitting Lower Body Dressing: Cueing for compensatory techniques, Cueing for sequencing, Maximal assistance Lower Body Dressing Details (indicate cue type and reason): In supported sitting. Toilet Transfer: Maximal assistance, Cueing for sequencing, Cueing for safety, BSC/3in1, +2 for physical assistance Toileting- Clothing Manipulation and Hygiene: Total assistance Toileting - Clothing Manipulation Details (indicate cue type and reason): at bedside commode level, based on clinical judgement   Cognition: Cognition Orientation Level: Oriented X4 Cognition Arousal: Alert Behavior During Therapy: Flat affect     Review of Systems  Constitutional:  Negative for fever.  HENT:  Negative for hearing loss.   Eyes:  Negative for blurred vision.  Respiratory: Negative.    Cardiovascular: Negative.   Gastrointestinal: Negative.   Genitourinary: Negative.   Musculoskeletal: Negative.   Skin:  Negative for rash.  Neurological:  Positive for focal weakness and weakness.  Psychiatric/Behavioral: Negative.         Past Medical History:  Diagnosis Date   BPH (benign prostatic hyperplasia)     CTS (carpal tunnel syndrome)     Depression     Diabetes mellitus     Essential hypertension     GERD (gastroesophageal reflux disease)     History of breast cancer      Hypercholesteremia     Neuropathy     Stroke [facility])               Past Surgical History:  Procedure Laterality Date   CATARACT EXTRACTION, BILATERAL   01/18/2017   CORONARY STENT INTERVENTION N/A 08/10/2023    Procedure: CORONARY STENT INTERVENTION;  Surgeon: Darron Deatrice LABOR, MD;  Location: [facility] INVASIVE CV LAB;  Service: Cardiovascular;  Laterality: N/A;   LEFT HEART CATH AND CORONARY ANGIOGRAPHY N/A 08/10/2023    Procedure: LEFT HEART CATH AND CORONARY ANGIOGRAPHY;  Surgeon: Darron Deatrice LABOR, MD;  Location: [facility] INVASIVE CV LAB;  Service: Cardiovascular;  Laterality: N/A;   LITHOTRIPSY       MASTECTOMY Right 05/02/2018   PACEMAKER IMPLANT N/A 05/13/2023    Procedure: PACEMAKER IMPLANT;  Surgeon: Kennyth Chew, MD;  Location: [facility] INVASIVE CV LAB;  Service: Cardiovascular;  Laterality: N/A;   TEMPORARY PACEMAKER N/A 05/11/2023    Procedure: TEMPORARY PACEMAKER;  Surgeon: [place], Peter M, MD;  Location: [facility] INVASIVE CV LAB;  Service: Cardiovascular;  Laterality: N/A;   TONSILLECTOMY AND ADENOIDECTOMY       TRANSESOPHAGEAL ECHOCARDIOGRAM (CATH LAB) N/A 12/08/2023    Procedure: TRANSESOPHAGEAL ECHOCARDIOGRAM;  Surgeon: Raford Riggs, MD;  Location: [facility] INVASIVE CV LAB;  Service: Cardiovascular;  Laterality: N/A;             Family History  Problem Relation Age of Onset   Lymphoma Mother     Cancer Mother     Diabetes Father     Stroke Father     Ovarian cancer Sister  Cancer Sister     Cancer Paternal Grandmother          Social History:  reports that he has never smoked. He has never used smokeless tobacco. He reports that he does not drink alcohol and does not use drugs. Allergies:  Allergies       Allergies  Allergen Reactions   Ramipril Anaphylaxis   Adhesive [Tape] Rash   Other Diarrhea      Severe intolerance to Chemotherapy in the past.   Sertraline Other (See Comments)      Extreme headaches            Medications Prior to Admission  Medication Sig  Dispense Refill   acetaminophen  (TYLENOL ) 325 MG tablet Take 2 tablets (650 mg total) by mouth every 6 (six) hours as needed for mild pain (pain score 1-3) or fever (or Fever >/= 101). (Patient taking differently: Take 650 mg by mouth every 8 (eight) hours as needed for mild pain (pain score 1-3) or fever (or Fever >/= 101).)       Ascorbic Acid  (VITAMIN C) 1000 MG tablet Take 1,000 mg by mouth 2 (two) times a week.       atorvastatin  (LIPITOR ) 80 MG tablet Take 1 tablet (80 mg total) by mouth at bedtime. 90 tablet 1   bisacodyl  (DULCOLAX) 10 MG suppository Place 10 mg rectally daily as needed for moderate constipation.       clopidogrel  (PLAVIX ) 75 MG tablet Take 1 tablet (75 mg total) by mouth daily with breakfast. 90 tablet 0   cyanocobalamin  1000 MCG tablet Take 1 tablet (1,000 mcg total) by mouth daily. 100 tablet 0   escitalopram  (LEXAPRO ) 10 MG tablet Take 1 tablet (10 mg total) by mouth daily. 90 tablet 1   ferrous sulfate  (FEROSUL) 325 (65 FE) MG tablet Take 1 tablet (325 mg total) by mouth daily with breakfast. 100 tablet 0   finasteride  (PROSCAR ) 5 MG tablet Take 1 tablet (5 mg total) by mouth daily. *Need appointment for future refills.* 30 tablet 0   insulin  glargine (LANTUS ) 100 UNIT/ML injection Inject 0.15 mLs (15 Units total) into the skin daily. 10 mL 11   losartan  (COZAAR ) 25 MG tablet Take 1 tablet (25 mg total) by mouth daily. 60 tablet 0   magnesium  hydroxide (MILK OF MAGNESIA) 400 MG/5ML suspension Take 30 mLs by mouth daily as needed for mild constipation.       metFORMIN  (GLUCOPHAGE ) 500 MG tablet Take 1 tablet (500 mg total) by mouth 2 (two) times daily with a meal. 60 tablet 0   Multiple Vitamin (MULTIVITAMIN) tablet Take 1 tablet by mouth 2 (two) times a week.       ondansetron  (ZOFRAN -ODT) 4 MG disintegrating tablet Take 1 tablet (4 mg total) by mouth every 8 (eight) hours as needed for nausea or vomiting. 20 tablet 0   pantoprazole  (PROTONIX ) 40 MG tablet Take 1 tablet  (40 mg total) by mouth at bedtime. 30 tablet 0   polyethylene glycol (MIRALAX  / GLYCOLAX ) 17 g packet Take 17 g by mouth daily.       PRESCRIPTION MEDICATION Place 1 suppository rectally daily as needed (Constipation).       senna-docusate (SENOKOT-S) 8.6-50 MG tablet Take 1 tablet by mouth 2 (two) times daily. 100 tablet 0   clotrimazole -betamethasone  (LOTRISONE ) cream Apply 1 Application topically daily. (Patient not taking: Reported on 01/17/2024) 30 g 0   hyoscyamine  (LEVSIN  SL) 0.125 MG SL tablet Place 1 tablet (0.125 mg total)  under the tongue every 4 (four) hours as needed for bladder spasms. (Patient not taking: Reported on 01/17/2024) 20 tablet 0            Blood pressure (!) 144/57, pulse 65, temperature 97.9 F (36.6 C), temperature source Oral, resp. rate 17, SpO2 98%. Physical Exam Constitutional:      General: He is not in acute distress. HENT:     Head: Normocephalic.     Right Ear: External ear normal.     Nose: Nose normal.  Eyes:     Conjunctiva/sclera: Conjunctivae normal.     Pupils: Pupils are equal, round, and reactive to light.  Pulmonary:     Effort: Pulmonary effort is normal.  Abdominal:     Palpations: Abdomen is soft.  Genitourinary:    Comments: foley Musculoskeletal:        General: Normal range of motion.     Cervical back: Normal range of motion.  Skin:    General: Skin is warm.  Neurological:     Mental Status: He is alert.     Comments: Alert and oriented x 3. Normal insight and awareness. Intact Memory. Normal language and speech. Cranial nerve exam unremarkable. MMT: RUE and RLE grossly 4 to 4+/5. LUE 3/5 delt, triceps, biceps, wrist, HI 3-. LLE 4- prox to 4/5 distal. Sensory exam normal for light touch and pain in all 4 limbs. No limb ataxia or cerebellar signs. No abnormal tone appreciated.  [name]    Psychiatric:        Mood and Affect: Mood normal.       Lab Results Last 24 Hours       Results for orders placed or performed during the  hospital encounter of 01/16/24 (from the past 24 hours)  Glucose, capillary     Status: Abnormal    Collection Time: 01/22/24 11:43 AM  Result Value Ref Range    Glucose-Capillary 312 (H) 70 - 99 mg/dL  Glucose, capillary     Status: Abnormal    Collection Time: 01/22/24  4:28 PM  Result Value Ref Range    Glucose-Capillary 317 (H) 70 - 99 mg/dL  Glucose, capillary     Status: Abnormal    Collection Time: 01/22/24  9:43 PM  Result Value Ref Range    Glucose-Capillary 221 (H) 70 - 99 mg/dL  Glucose, capillary     Status: Abnormal    Collection Time: 01/23/24  6:28 AM  Result Value Ref Range    Glucose-Capillary 186 (H) 70 - 99 mg/dL      Imaging Results (Last 48 hours)  No results found.     Assessment/Plan: Diagnosis: 78 year old male with right pontine infarct Does the need for close, 24 hr/day medical supervision in concert with the patient's rehab needs make it unreasonable for this patient to be served in a less intensive setting? Yes Co-Morbidities requiring supervision/potential complications:  - Acute cystitis -Type 2 diabetes -BPH with urine retention -Hypertension -CAD with history of CHF Due to bladder management, bowel management, safety, skin/wound care, disease management, medication administration, pain management, and patient education, does the patient require 24 hr/day rehab nursing? Yes Does the patient require coordinated care of a physician, rehab nurse, therapy disciplines of PT, OT, ?SLP to address physical and functional deficits in the context of the above medical diagnosis(es)? Yes Addressing deficits in the following areas: balance, endurance, locomotion, strength, transferring, bowel/bladder control, bathing, dressing, feeding, grooming, toileting, cognition, and psychosocial support Can the patient actively participate in an  intensive therapy program of at least 3 hrs of therapy per day at least 5 days per week? Yes The potential for patient to make  measurable gains while on inpatient rehab is good Anticipated functional outcomes upon discharge from inpatient rehab are supervision  with PT, modified independent and supervision with OT, ?mod I with SLP. Estimated rehab length of stay to reach the above functional goals is: 9-12 days Anticipated discharge destination: Home Overall Rehab/Functional Prognosis: excellent   POST ACUTE RECOMMENDATIONS: This patient's condition is appropriate for continued rehabilitative care in the following setting: CIR Patient has agreed to participate in recommended program. Yes Note that insurance prior authorization may be required for reimbursement for recommended care.   Comment: Wife will need to be able to provide at least supervision at discharge. Rehab Admissions Coordinator to follow up.       I have personally performed a face to face diagnostic evaluation of this patient. Additionally, I have examined the patient's medical record including any pertinent labs and radiographic images.     Thanks,   Arthea ONEIDA Gunther, MD 01/23/2024          Routing History

## 2024-01-26 DIAGNOSIS — I6350 Cerebral infarction due to unspecified occlusion or stenosis of unspecified cerebral artery: Secondary | ICD-10-CM | POA: Diagnosis not present

## 2024-01-26 LAB — GLUCOSE, CAPILLARY
Glucose-Capillary: 253 mg/dL — ABNORMAL HIGH (ref 70–99)
Glucose-Capillary: 254 mg/dL — ABNORMAL HIGH (ref 70–99)
Glucose-Capillary: 233 mg/dL — ABNORMAL HIGH (ref 70–99)
Glucose-Capillary: 197 mg/dL — ABNORMAL HIGH (ref 70–99)

## 2024-01-26 LAB — BASIC METABOLIC PANEL WITH GFR
Anion gap: 11 (ref 5–15)
BUN: 20 mg/dL (ref 8–23)
CO2: 23 mmol/L (ref 22–32)
Calcium: 9.4 mg/dL (ref 8.9–10.3)
Chloride: 101 mmol/L (ref 98–111)
Creatinine, Ser: 0.77 mg/dL (ref 0.61–1.24)
GFR, Estimated: >60 mL/min (ref >60)
Glucose, Bld: 281 mg/dL — ABNORMAL HIGH (ref 70–99)
Potassium: 3.9 mmol/L (ref 3.5–5.1)
Sodium: 135 mmol/L (ref 135–145)

## 2024-01-26 LAB — CBC
HCT: 35.9 % — ABNORMAL LOW (ref 39.0–52.0)
Hemoglobin: 12.2 g/dL — ABNORMAL LOW (ref 13.0–17.0)
MCH: 30 pg (ref 26.0–34.0)
MCHC: 34 g/dL (ref 30.0–36.0)
MCV: 88.2 fL (ref 80.0–100.0)
Platelets: 225 K/uL (ref 150–400)
RBC: 4.07 MIL/uL — ABNORMAL LOW (ref 4.22–5.81)
RDW: 17.7 % — ABNORMAL HIGH (ref 11.5–15.5)
WBC: 7.4 K/uL (ref 4.0–10.5)
nRBC: 0 % (ref 0.0–0.2)

## 2024-01-26 MED ORDER — BACLOFEN 5 MG HALF TABLET
5.0000 mg | ORAL_TABLET | Freq: Three times a day (TID) | ORAL | Status: AC
Start: 2024-01-26 — End: ?
  Administered 2024-01-26 – 2024-01-27 (×4): 5 mg via ORAL
  Filled 2024-01-26 (×4): qty 1

## 2024-01-26 MED ORDER — FINASTERIDE 5 MG PO TABS
5.0000 mg | ORAL_TABLET | Freq: Every day | ORAL | Status: DC
  Administered 2024-01-26 – 2024-01-28 (×3): 5 mg via ORAL
  Filled 2024-01-26 (×3): qty 1

## 2024-01-26 NOTE — Progress Notes (Signed)
 Physical Therapy Session Note  Patient Details  Name: John [name] MRN: [id number] Date of Birth: 06-Dec-1945  Today's Date: 01/26/2024 PT Individual Time: [phone] PT Individual Time Calculation (min): 50 min   Short Term Goals: Week 1:  PT Short Term Goal 1 (Week 1): Pt will perform supine<->sit with Min A PT Short Term Goal 2 (Week 1): Pt will amb x25' using RW, CGA PT Short Term Goal 3 (Week 1): pt will transfer sit<->stand with SBA following proper sequencing 80% of time  Skilled Therapeutic Interventions/Progress Updates:    Pt in bed with Nsg for med pass.  Pt performed supine to sit with Mod/Max A for trunk support, BLE placement.  Sit to stand xfer to RW with mod A.  Pt unsteady initially, with LE's slightly buckling.  Pt able to self correct, stand up right and take ~x4 steps before needed to sit in w/c.  Pt wheeled to day gym and started on Cybex 30 cm/sec x5.   Gait tx with RW, Min A x5.6 ft, rest, x14.4'.  Pt c/o fatigue, having difficulty standing during second trial requiring longer time to stand.  Pt wheeled back to room, transferred to bed with mod/max A x2.  All items within reach.    Therapy Documentation Precautions:  Precautions Precautions: Fall Recall of Precautions/Restrictions: Impaired Precaution/Restrictions Comments: L hemiparesis Restrictions Weight Bearing Restrictions Per Provider Order: Yes Other Position/Activity Restrictions: L sided weakness General: PT Amount of Missed Time (min): 10 Minutes PT Missed Treatment Reason: Nursing care (med pass)    Pain: denies pain       Therapy/Group: Individual Therapy  [name] Fast 01/26/2024, 11:58 AM

## 2024-01-26 NOTE — Plan of Care (Signed)
  Problem: RH Cognition - SLP Goal: RH LTG Patient will demonstrate orientation with cues Description:  LTG:  Patient will demonstrate orientation to person/place/time/situation with cues (SLP)   Flowsheets (Taken 01/26/2024 1143) LTG Patient will demonstrate orientation to:  Person  Place  Time  Situation LTG: Patient will demonstrate orientation using cueing (SLP): Minimal Assistance - Patient > 75%   Problem: RH Memory Goal: LTG Patient will use memory compensatory aids to (SLP) Description: LTG:  Patient will use memory compensatory aids to recall biographical/new, daily complex information with cues (SLP) Flowsheets (Taken 01/26/2024 1143) LTG: Patient will use memory compensatory aids to (SLP): Moderate Assistance - Patient 50 - 74%   Problem: RH Attention Goal: LTG Patient will demonstrate this level of attention during functional activites (SLP) Description: LTG:  Patient will will demonstrate this level of attention during functional activites (SLP) Flowsheets (Taken 01/26/2024 1143) Patient will demonstrate during cognitive/linguistic activities the attention type of: Sustained Patient will demonstrate this level of attention during cognitive/linguistic activities in: Controlled LTG: Patient will demonstrate this level of attention during cognitive/linguistic activities with assistance of (SLP): Moderate Assistance - Patient 50 - 74% Number of minutes patient will demonstrate attention during cognitive/linguistic activities: 15   Problem: RH Awareness Goal: LTG: Patient will demonstrate awareness during functional activites type of (SLP) Description: LTG: Patient will demonstrate awareness during functional activites type of (SLP) Flowsheets (Taken 01/26/2024 1143) Patient will demonstrate during cognitive/linguistic activities awareness type of: Intellectual LTG: Patient will demonstrate awareness during cognitive/linguistic activities with assistance of (SLP): Moderate  Assistance - Patient 50 - 74%

## 2024-01-26 NOTE — Progress Notes (Signed)
 PROGRESS NOTE   Subjective/Complaints:  A.m. labs stable.  No acute complaints.  No events overnight. Patient much more awake, alert today.  Pleasant, complains of left lower extremity not working but other than that doing well.   ROS: Positives per HPI above. Denies fevers, chills, N/V, abdominal pain, SOB, chest pain, new weakness or paraesthesias.   + Left lower extremity weakness  Objective:   No results found. Recent Labs    01/25/24 0523 01/26/24 0516  WBC 8.1 7.4  HGB 12.4* 12.2*  HCT 36.7* 35.9*  PLT 233 225   Recent Labs    01/25/24 0523 01/26/24 0516  NA 138 135  K 3.9 3.9  CL 102 101  CO2 27 23  GLUCOSE 174* 281*  BUN 24* 20  CREATININE 0.84 0.77  CALCIUM  9.9 9.4    Intake/Output Summary (Last 24 hours) at 01/26/2024 0854 Last data filed at 01/26/2024 0851 Gross per 24 hour  Intake 466 ml  Output 1500 ml  Net -1034 ml        Physical Exam: Vital Signs Blood pressure (!) 151/80, pulse 62, temperature 97.7 F (36.5 C), resp. rate 18, height 6' (1.829 m), weight 80 kg, SpO2 98%. Constitutional: No apparent distress. Appropriate appearance for age.  Sitting up at bedside with OT. HENT: No JVD. Neck Supple. Trachea midline. Atraumatic, normocephalic. Cardiovascular: RRR, no murmurs/rub/gallops. No Edema. Peripheral pulses 2+  Respiratory: CTAB. No rales, rhonchi, or wheezing. On RA.  Abdomen: + bowel sounds, normoactive. No distention  GU: Not examined. +Foley, draining clear urine.  Skin: C/D/I. No apparent lesions.  MSK:      No apparent deformity.  Extremely tight left hamstrings, unable to extend knee greater than -5 degrees.  No tenderness to palpation, no apparent deformity or effusion.   Neurologic exam:  Awake, alert, oriented x 3. Light touch intact in all 4 extremities Strength: Right upper and lower extremity 5 out of 5.  Left upper extremity 3/4 SA, otherwise 5- out of 5.    Left lower extremity 3/5 HF, 2/5 KE, 3/5 DF and PF  Heavy posterior lean with head forward flexed, needs continuous cueing to sit upright  + Left  lower extremity motor apraxia + Left upper and lower extremity ataxia Negative Hoffmann's, negative Babinski Tone: Hamstring tone as above, no apparent spasticity.       Assessment/Plan: 1. Functional deficits which require 3+ hours per day of interdisciplinary therapy in a comprehensive inpatient rehab setting. Physiatrist is providing close team supervision and 24 hour management of active medical problems listed below. Physiatrist and rehab team continue to assess barriers to discharge/monitor patient progress toward functional and medical goals  Care Tool:  Bathing              Bathing assist Assist Level: Maximal Assistance - Patient 24 - 49%     Upper Body Dressing/Undressing Upper body dressing        Upper body assist Assist Level: Maximal Assistance - Patient 25 - 49%    Lower Body Dressing/Undressing Lower body dressing            Lower body assist Assist for lower body dressing: Dependent - Patient  0%     Toileting Toileting    Toileting assist Assist for toileting: 2 Helpers     Transfers Chair/bed transfer  Transfers assist  Chair/bed transfer activity did not occur: Safety/medical concerns  Chair/bed transfer assist level: 2 Helpers     Locomotion Ambulation   Ambulation assist   Ambulation activity did not occur: Safety/medical concerns (unsafe due to weakness)          Walk 10 feet activity   Assist  Walk 10 feet activity did not occur: Safety/medical concerns (unsafe due to weakness)        Walk 50 feet activity   Assist Walk 50 feet with 2 turns activity did not occur: Safety/medical concerns (unsafe due to weakness)         Walk 150 feet activity   Assist Walk 150 feet activity did not occur: Safety/medical concerns (unsafe due to weakness)         Walk  10 feet on uneven surface  activity   Assist Walk 10 feet on uneven surfaces activity did not occur: Safety/medical concerns (unsafe due to weakness)         Wheelchair     Assist     Wheelchair activity did not occur: Safety/medical concerns (unsafe due to weakness)         Wheelchair 50 feet with 2 turns activity    Assist    Wheelchair 50 feet with 2 turns activity did not occur: Safety/medical concerns (unsafe due to weakness)       Wheelchair 150 feet activity     Assist  Wheelchair 150 feet activity did not occur: Safety/medical concerns (unsafe due to weakness)       Blood pressure (!) 151/80, pulse 62, temperature 97.7 F (36.5 C), resp. rate 18, height 6' (1.829 m), weight 80 kg, SpO2 98%.  Medical Problem List and Plan: 1. Functional deficits secondary to acute pontine infarct             -patient may  shower             -ELOS/Goals: 9-12 days Min A to supervision             -Stable to continue CIR  2.  Antithrombotics: -DVT/anticoagulation:  Pharmaceutical: Lovenox              -antiplatelet therapy: Asprin and Plavix  x 3 weeks then Aspirin  alone (ends 10/28).  3. Pain Management: Tylenol  as needed  4. Mood/Behavior/Sleep: LCSW to follow for evaluation and support when available.              -antipsychotic agents:  Lexapro  ---was on Lexapro  10 mg PTA, wife believes SNF was withholding medications and requests he resume use.    - 10-15: Difficult to arouse, somnolent with intermittently emotionally labile throughout the day.  Continue Lexapro  as above.  Start adjunctive modafinil 100 mg for 3 days, then increase to 200 mg daily.  Starting sleep log.  - 10-16: Sleep log appropriate, much improved today.  Pleasant, appropriate.  5. Neuropsych/cognition: This patient is not fully capable of making decisions on his own behalf.   - 10-15: Starting Provigil as above--significantly improved, continue  6. Skin/Wound Care: Routine pressure relief  measures             -Stage 2 Sacral wound--wound care orders placed--Gerhardt's butt cream +Zinc  PO    7. Fluids/Electrolytes/Nutrition: Monitor intake and output.  CBC/CMET in a.m.             -  SLP consult             -Cardiac diet +Ensure, Zinc  and vitamin C    - 10-15: Albumin 2.8.  Poor p.o. intakes.  Monitor, dietary to assess  - 10-16: Ate 100% of breakfast; cognitively much better today.  Monitor.  8.  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  Atorvastatin  80 mg daily.   9.  CAD/HLD: Atorvastatin    9.  Acute cystitis: Urine culture ESBL E. Coli---Treated with meropenem then 1 dose of fosfomycin.   10.  Hx urinary retention/BPH: Foley catheter present--- continue to monitor. -Chronic since 07/2023   - Continue with Foley for now  11.  HTN: Resumed losartan  25 mg daily    - Monitor vitals with initiation of therapies    01/26/2024    4:44 AM 01/25/2024    8:54 PM 01/25/2024    2:44 PM  Vitals with BMI  Systolic 151 136 869  Diastolic 80 72 63  Pulse 62 80 70     12.  T2DM: A1c 7.5% continue CBGs ac/hs with SSI.  On chart review from 11-2023, Home regimen was metformin  500 mg twice daily, Lantus  10 units every morning/15 units every afternoon.             - Lantus  increased to 5 units twice daily--monitor for hypoglycemia Recent Labs    01/25/24 2110 01/26/24 0553 01/26/24 1227  GLUCAP 282* 253* 254*      - 10-15: Blood sugars very elevated today.  Increase Lantus  to 10 units twice daily.  Resume metformin  500 mg twice daily--monitor  13.  Heart block: S/p PPM   14.  GERD: Continue PPI    15. Constipation?- Pt didn't remember LBM- was earlier this AM- medium type 6- incontinent   - Last bowel movement 10-14, medium.  16.  Left lower extremity hamstring tightness.  Not velocity dependent, but very limiting for coordination.  - Start baclofen 5 mg 3 times daily  LOS: 2 days A FACE TO  FACE EVALUATION WAS PERFORMED  John [name] Likes 01/26/2024, 8:54 AM

## 2024-01-26 NOTE — Plan of Care (Signed)
 Problem: Consults Goal: RH STROKE PATIENT EDUCATION Description: See Patient Education module for education specifics  01/26/2024 0708 by Stevens GLENWOOD Con Randine JONETTA, RN Outcome: Progressing 01/26/2024 0707 by Stevens GLENWOOD Con Randine JONETTA, RN Outcome: Progressing Goal: Nutrition Consult-if indicated 01/26/2024 0708 by Stevens GLENWOOD Con Randine JONETTA, RN Outcome: Progressing 01/26/2024 0707 by Stevens GLENWOOD Con Randine JONETTA, RN Outcome: Progressing Goal: Diabetes Guidelines if Diabetic/Glucose > 140 Description: If diabetic or lab glucose is > 140 mg/dl - Initiate Diabetes/Hyperglycemia Guidelines & Document Interventions  01/26/2024 0708 by Stevens GLENWOOD Con Randine JONETTA, RN Outcome: Progressing 01/26/2024 0707 by Stevens GLENWOOD Con Randine JONETTA, RN Outcome: Progressing   Problem: RH BOWEL ELIMINATION Goal: RH STG MANAGE BOWEL WITH ASSISTANCE Description: STG Manage Bowel with mod I Assistance. 01/26/2024 0708 by Stevens GLENWOOD Con Randine JONETTA, RN Outcome: Progressing 01/26/2024 0707 by Stevens GLENWOOD Con Randine JONETTA, RN Outcome: Progressing Goal: RH STG MANAGE BOWEL W/MEDICATION W/ASSISTANCE Description: STG Manage Bowel with Medication with mod I Assistance. 01/26/2024 0708 by Stevens GLENWOOD Con Randine JONETTA, RN Outcome: Progressing 01/26/2024 0707 by Stevens GLENWOOD Con Randine JONETTA, RN Outcome: Progressing   Problem: RH BLADDER ELIMINATION Goal: RH STG MANAGE BLADDER WITH ASSISTANCE Description: STG Manage Bladder With min Assistance 01/26/2024 0708 by Stevens GLENWOOD Con Randine JONETTA, RN Outcome: Progressing 01/26/2024 0707 by Stevens GLENWOOD Con Randine JONETTA, RN Outcome: Progressing Goal: RH STG MANAGE BLADDER WITH MEDICATION WITH ASSISTANCE Description: STG Manage Bladder With Medication With mod I Assistance. 01/26/2024 0708 by Stevens GLENWOOD Con Randine JONETTA, RN Outcome: Progressing 01/26/2024 0707 by Stevens GLENWOOD Con Randine JONETTA, RN Outcome: Progressing Goal: RH STG MANAGE BLADDER WITH EQUIPMENT WITH ASSISTANCE Description: STG Manage Bladder With Equipment With min  Assistance 01/26/2024 0708 by Stevens GLENWOOD Con Randine JONETTA, RN Outcome: Progressing 01/26/2024 0707 by Stevens GLENWOOD Con Randine JONETTA, RN Outcome: Progressing   Problem: RH SKIN INTEGRITY Goal: RH STG SKIN FREE OF INFECTION/BREAKDOWN Description: Manage skin w min assist 01/26/2024 0708 by Stevens GLENWOOD Con Randine JONETTA, RN Outcome: Progressing 01/26/2024 0707 by Stevens GLENWOOD Con Randine JONETTA, RN Outcome: Progressing Goal: RH STG MAINTAIN SKIN INTEGRITY WITH ASSISTANCE Description: STG Maintain Skin Integrity With min Assistance. 01/26/2024 0708 by Stevens GLENWOOD Con Randine JONETTA, RN Outcome: Progressing 01/26/2024 0707 by Stevens GLENWOOD Con Randine JONETTA, RN Outcome: Progressing Goal: RH STG ABLE TO PERFORM INCISION/WOUND CARE W/ASSISTANCE Description: STG Able To Perform Incision/Wound Care With min Assistance. 01/26/2024 0708 by Stevens GLENWOOD Con Randine JONETTA, RN Outcome: Progressing 01/26/2024 0707 by Stevens GLENWOOD Con Randine JONETTA, RN Outcome: Progressing   Problem: RH SAFETY Goal: RH STG ADHERE TO SAFETY PRECAUTIONS W/ASSISTANCE/DEVICE Description: STG Adhere to Safety Precautions With cues Assistance/Device. 01/26/2024 0708 by Stevens GLENWOOD Con Randine JONETTA, RN Outcome: Progressing 01/26/2024 0707 by Stevens GLENWOOD Con Randine JONETTA, RN Outcome: Progressing   Problem: RH PAIN MANAGEMENT Goal: RH STG PAIN MANAGED AT OR BELOW PT'S PAIN GOAL Description: Pain < 4 with prns 01/26/2024 0708 by Stevens GLENWOOD Con Randine JONETTA, RN Outcome: Progressing 01/26/2024 0707 by Stevens GLENWOOD Con Randine JONETTA, RN Outcome: Progressing   Problem: RH KNOWLEDGE DEFICIT Goal: RH STG INCREASE KNOWLEDGE OF DIABETES Description: Patient and wife will be able to manage DM using educational resources for medication and dietary modification independently 01/26/2024 0708 by Stevens GLENWOOD Con Randine JONETTA, RN Outcome: Progressing 01/26/2024 0707 by Stevens GLENWOOD Con Randine JONETTA, RN Outcome: Progressing Goal: RH STG INCREASE KNOWLEDGE OF HYPERTENSION Description: Patient and wife will be able to  manage HTN using educational  resources for medication and dietary modification independently 01/26/2024 0708 by Stevens GLENWOOD Con Randine JONETTA, RN Outcome: Progressing 01/26/2024 0707 by Stevens GLENWOOD Con Randine JONETTA, RN Outcome: Progressing Goal: RH STG INCREASE KNOWLEGDE OF HYPERLIPIDEMIA Description: Patient and wife will be able to manage HLD using educational resources for medication and dietary modification independently 01/26/2024 0708 by Stevens GLENWOOD Con Randine JONETTA, RN Outcome: Progressing 01/26/2024 0707 by Stevens GLENWOOD Con Randine JONETTA, RN Outcome: Progressing Goal: RH STG INCREASE KNOWLEDGE OF STROKE PROPHYLAXIS Description: Patient and wife will be able to manage secondary risks using educational resources for medication and dietary modification independently 01/26/2024 0708 by Stevens GLENWOOD Con Randine JONETTA, RN Outcome: Progressing 01/26/2024 0707 by Stevens GLENWOOD Con Randine JONETTA, RN Outcome: Progressing

## 2024-01-26 NOTE — Progress Notes (Signed)
 Occupational Therapy Session Note  Patient Details  Name: John [name] MRN: [id number] Date of Birth: 1945/09/17  Today's Date: 01/26/2024 OT Individual Time: 1105-1200 OT Individual Time Calculation (min): 55 min   Today's Date: 01/26/2024 OT Individual Time: 1350-1415 OT Individual Time Calculation (min): 25 min   Short Term Goals: Week 1:  OT Short Term Goal 1 (Week 1): Pt will complete LB dressing at Max A with AE as necessary OT Short Term Goal 2 (Week 1): Pt will complete toilet transfer at Max A with LRAD OT Short Term Goal 3 (Week 1): Pt will use Lt hand as stabilizer during ADL activities with Mod VCs  Skilled Therapeutic Interventions/Progress Updates:   Session 1: Pt greeted resting in bed for skilled OT session with focus on BADL retraining and functional transfers.   Pain: Pt reported un-rated generalized pain. OT offering intermediate rest breaks and positioning suggestions throughout session to address pain/fatigue and maximize participation/safety in session.   Functional Transfers: Lateral rolls with supervision towards L-side, Min A towards R-side. Supine>sit EOB with Max A (HOB elevated). Stand-pivot from EOB>TIS WC with Mod A (Min A +2 for hip pivoting).   Self Care Tasks: Pt completes the following self care tasks with levels of assistance noted below, UB: T-shirt donned with Mod A. CGA-Min A for sitting balance EOB.  LB: Completed at bed-level with Max A, patient assisting with lateral rolling for hike. Pt with incontinent BM, Max A provided for care.   Pt remained sitting in TIS WC with 4Ps assessed and immediate needs met. Pt continues to be appropriate for skilled OT intervention to promote further functional independence in ADLs/IADLs.   Session 2:  Pt greeted sitting in TIS WC for skilled OT session with focus on functional transfers and activity tolerance.   Pain: Pt with no reports of pain. OT offering intermediate rest breaks and positioning  suggestions throughout session to potential address pain/fatigue and maximize participation/safety in session.   Functional Transfers/Therapeutic activity: Blocked practice sit<>stands with Mod A + RW. Bouts of static stance lasting ~30 secs-1 min with Min A + RW, patient able to take steps forward/backward with LLE as pre-gait activity/improved standing balance.  Attempted functional mobility with no success due to decreased initiation/time constraints. TIS WC>EOB with Max A stand-pivot (+2 Min A orienting hips).   Self Care Tasks: No needs this session.   Pt remained resting in bed with 4Ps assessed and immediate needs met. Pt continues to be appropriate for skilled OT intervention to promote further functional independence in ADLs/IADLs.   Therapy Documentation Precautions:  Precautions Precautions: Fall Recall of Precautions/Restrictions: Impaired Precaution/Restrictions Comments: L hemiparesis Restrictions Weight Bearing Restrictions Per Provider Order: Yes Other Position/Activity Restrictions: L sided weakness   Therapy/Group: Individual Therapy  [name], OTR/L, MSOT  01/26/2024, 8:00 AM

## 2024-01-26 NOTE — Plan of Care (Signed)
 Problem: Consults Goal: RH STROKE PATIENT EDUCATION Description: See Patient Education module for education specifics  01/26/2024 0708 by Stevens GLENWOOD Con Randine JONETTA, RN Outcome: Progressing 01/26/2024 0708 by Stevens GLENWOOD Con Randine JONETTA, RN Outcome: Progressing 01/26/2024 0708 by Stevens GLENWOOD Con Randine JONETTA, RN Outcome: Progressing 01/26/2024 0707 by Stevens GLENWOOD Con Randine JONETTA, RN Outcome: Progressing Goal: Nutrition Consult-if indicated 01/26/2024 0708 by Stevens GLENWOOD Con Randine JONETTA, RN Outcome: Progressing 01/26/2024 0708 by Stevens GLENWOOD Con Randine JONETTA, RN Outcome: Progressing 01/26/2024 0708 by Stevens GLENWOOD Con Randine JONETTA, RN Outcome: Progressing 01/26/2024 9292 by Stevens GLENWOOD Con Randine JONETTA, RN Outcome: Progressing Goal: Diabetes Guidelines if Diabetic/Glucose > 140 Description: If diabetic or lab glucose is > 140 mg/dl - Initiate Diabetes/Hyperglycemia Guidelines & Document Interventions  01/26/2024 0708 by Stevens GLENWOOD Con Randine JONETTA, RN Outcome: Progressing 01/26/2024 0708 by Stevens GLENWOOD Con Randine JONETTA, RN Outcome: Progressing 01/26/2024 0708 by Stevens GLENWOOD Con Randine JONETTA, RN Outcome: Progressing 01/26/2024 0707 by Stevens GLENWOOD Con Randine JONETTA, RN Outcome: Progressing   Problem: RH BOWEL ELIMINATION Goal: RH STG MANAGE BOWEL WITH ASSISTANCE Description: STG Manage Bowel with mod I Assistance. 01/26/2024 0708 by Stevens GLENWOOD Con Randine JONETTA, RN Outcome: Progressing 01/26/2024 0708 by Stevens GLENWOOD Con Randine JONETTA, RN Outcome: Progressing 01/26/2024 0708 by Stevens GLENWOOD Con Randine JONETTA, RN Outcome: Progressing 01/26/2024 0707 by Stevens GLENWOOD Con Randine JONETTA, RN Outcome: Progressing Goal: RH STG MANAGE BOWEL W/MEDICATION W/ASSISTANCE Description: STG Manage Bowel with Medication with mod I Assistance. 01/26/2024 0708 by Stevens GLENWOOD Con Randine JONETTA, RN Outcome: Progressing 01/26/2024 0708 by Stevens GLENWOOD Con Randine JONETTA, RN Outcome: Progressing 01/26/2024 0708 by Stevens GLENWOOD Con Randine JONETTA, RN Outcome: Progressing 01/26/2024 0707 by Stevens GLENWOOD Con Randine JONETTA,  RN Outcome: Progressing   Problem: RH BLADDER ELIMINATION Goal: RH STG MANAGE BLADDER WITH ASSISTANCE Description: STG Manage Bladder With min Assistance 01/26/2024 0708 by Stevens GLENWOOD Con Randine JONETTA, RN Outcome: Progressing 01/26/2024 0708 by Stevens GLENWOOD Con Randine JONETTA, RN Outcome: Progressing 01/26/2024 0708 by Stevens GLENWOOD Con Randine JONETTA, RN Outcome: Progressing 01/26/2024 [id number] by Stevens GLENWOOD Con Randine JONETTA, RN Outcome: Progressing Goal: RH STG MANAGE BLADDER WITH MEDICATION WITH ASSISTANCE Description: STG Manage Bladder With Medication With mod I Assistance. 01/26/2024 0708 by Stevens GLENWOOD Con Randine JONETTA, RN Outcome: Progressing 01/26/2024 0708 by Stevens GLENWOOD Con Randine JONETTA, RN Outcome: Progressing 01/26/2024 0708 by Stevens GLENWOOD Con Randine JONETTA, RN Outcome: Progressing 01/26/2024 0707 by Stevens GLENWOOD Con Randine JONETTA, RN Outcome: Progressing Goal: RH STG MANAGE BLADDER WITH EQUIPMENT WITH ASSISTANCE Description: STG Manage Bladder With Equipment With min Assistance 01/26/2024 0708 by Stevens GLENWOOD Con Randine JONETTA, RN Outcome: Progressing 01/26/2024 0708 by Stevens GLENWOOD Con Randine JONETTA, RN Outcome: Progressing 01/26/2024 0708 by Stevens GLENWOOD Con Randine JONETTA, RN Outcome: Progressing 01/26/2024 0707 by Stevens GLENWOOD Con Randine JONETTA, RN Outcome: Progressing   Problem: RH SKIN INTEGRITY Goal: RH STG SKIN FREE OF INFECTION/BREAKDOWN Description: Manage skin w min assist 01/26/2024 0708 by Stevens GLENWOOD Con Randine JONETTA, RN Outcome: Progressing 01/26/2024 0708 by Stevens GLENWOOD Con Randine JONETTA, RN Outcome: Progressing 01/26/2024 0708 by Stevens GLENWOOD Con Randine JONETTA, RN Outcome: Progressing 01/26/2024 0707 by Stevens GLENWOOD Con Randine JONETTA, RN Outcome: Progressing Goal: RH STG MAINTAIN SKIN INTEGRITY WITH ASSISTANCE Description: STG Maintain Skin Integrity With min Assistance. 01/26/2024 0708 by Stevens GLENWOOD Con Randine JONETTA, RN Outcome: Progressing 01/26/2024 0708 by Stevens GLENWOOD Con Randine JONETTA, RN Outcome: Progressing 01/26/2024 0708 by Stevens GLENWOOD Con,  Randine BIRCH,  RN Outcome: Progressing 01/26/2024 0707 by Stevens GLENWOOD Con Randine BIRCH, RN Outcome: Progressing Goal: RH STG ABLE TO PERFORM INCISION/WOUND CARE W/ASSISTANCE Description: STG Able To Perform Incision/Wound Care With min Assistance. 01/26/2024 0708 by Stevens GLENWOOD Con Randine BIRCH, RN Outcome: Progressing 01/26/2024 0708 by Stevens GLENWOOD Con Randine BIRCH, RN Outcome: Progressing 01/26/2024 0708 by Stevens GLENWOOD Con Randine BIRCH, RN Outcome: Progressing 01/26/2024 0707 by Stevens GLENWOOD Con Randine BIRCH, RN Outcome: Progressing   Problem: RH SAFETY Goal: RH STG ADHERE TO SAFETY PRECAUTIONS W/ASSISTANCE/DEVICE Description: STG Adhere to Safety Precautions With cues Assistance/Device. 01/26/2024 0708 by Stevens GLENWOOD Con Randine BIRCH, RN Outcome: Progressing 01/26/2024 0708 by Stevens GLENWOOD Con Randine BIRCH, RN Outcome: Progressing 01/26/2024 0708 by Stevens GLENWOOD Con Randine BIRCH, RN Outcome: Progressing 01/26/2024 0707 by Stevens GLENWOOD Con Randine BIRCH, RN Outcome: Progressing   Problem: RH PAIN MANAGEMENT Goal: RH STG PAIN MANAGED AT OR BELOW PT'S PAIN GOAL Description: Pain < 4 with prns 01/26/2024 0708 by Stevens GLENWOOD Con Randine BIRCH, RN Outcome: Progressing 01/26/2024 0708 by Stevens GLENWOOD Con Randine BIRCH, RN Outcome: Progressing 01/26/2024 0708 by Stevens GLENWOOD Con Randine BIRCH, RN Outcome: Progressing 01/26/2024 0707 by Stevens GLENWOOD Con Randine BIRCH, RN Outcome: Progressing   Problem: RH KNOWLEDGE DEFICIT Goal: RH STG INCREASE KNOWLEDGE OF DIABETES Description: Patient and wife will be able to manage DM using educational resources for medication and dietary modification independently 01/26/2024 0708 by Stevens GLENWOOD Con Randine BIRCH, RN Outcome: Progressing 01/26/2024 0708 by Stevens GLENWOOD Con Randine BIRCH, RN Outcome: Progressing 01/26/2024 0708 by Stevens GLENWOOD Con Randine BIRCH, RN Outcome: Progressing 01/26/2024 0707 by Stevens GLENWOOD Con Randine BIRCH, RN Outcome: Progressing Goal: RH STG INCREASE KNOWLEDGE OF HYPERTENSION Description: Patient and wife will be able to manage HTN using  educational resources for medication and dietary modification independently 01/26/2024 0708 by Stevens GLENWOOD Con Randine BIRCH, RN Outcome: Progressing 01/26/2024 0708 by Stevens GLENWOOD Con Randine BIRCH, RN Outcome: Progressing 01/26/2024 0708 by Stevens GLENWOOD Con Randine BIRCH, RN Outcome: Progressing 01/26/2024 0707 by Stevens GLENWOOD Con Randine BIRCH, RN Outcome: Progressing Goal: RH STG INCREASE KNOWLEGDE OF HYPERLIPIDEMIA Description: Patient and wife will be able to manage HLD using educational resources for medication and dietary modification independently 01/26/2024 0708 by Stevens GLENWOOD Con Randine BIRCH, RN Outcome: Progressing 01/26/2024 0708 by Stevens GLENWOOD Con Randine BIRCH, RN Outcome: Progressing 01/26/2024 0708 by Stevens GLENWOOD Con Randine BIRCH, RN Outcome: Progressing 01/26/2024 0707 by Stevens GLENWOOD Con Randine BIRCH, RN Outcome: Progressing Goal: RH STG INCREASE KNOWLEDGE OF STROKE PROPHYLAXIS Description: Patient and wife will be able to manage secondary risks using educational resources for medication and dietary modification independently 01/26/2024 0708 by Stevens GLENWOOD Con Randine BIRCH, RN Outcome: Progressing 01/26/2024 0708 by Stevens GLENWOOD Con Randine BIRCH, RN Outcome: Progressing 01/26/2024 0708 by Stevens GLENWOOD Con Randine BIRCH, RN Outcome: Progressing 01/26/2024 0707 by Stevens GLENWOOD Con Randine BIRCH, RN Outcome: Progressing

## 2024-01-26 NOTE — Plan of Care (Signed)
 Problem: Consults Goal: RH STROKE PATIENT EDUCATION Description: See Patient Education module for education specifics  01/26/2024 0708 by Stevens GLENWOOD Con Randine JONETTA, RN Outcome: Progressing 01/26/2024 0708 by Stevens GLENWOOD Con Randine JONETTA, RN Outcome: Progressing 01/26/2024 0708 by Stevens GLENWOOD Con Randine JONETTA, RN Outcome: Progressing 01/26/2024 0708 by Stevens GLENWOOD Con Randine JONETTA, RN Outcome: Progressing 01/26/2024 0707 by Stevens GLENWOOD Con Randine JONETTA, RN Outcome: Progressing Goal: Nutrition Consult-if indicated 01/26/2024 0708 by Stevens GLENWOOD Con Randine JONETTA, RN Outcome: Progressing 01/26/2024 0708 by Stevens GLENWOOD Con Randine JONETTA, RN Outcome: Progressing 01/26/2024 0708 by Stevens GLENWOOD Con Randine JONETTA, RN Outcome: Progressing 01/26/2024 0708 by Stevens GLENWOOD Con Randine JONETTA, RN Outcome: Progressing 01/26/2024 9292 by Stevens GLENWOOD Con Randine JONETTA, RN Outcome: Progressing Goal: Diabetes Guidelines if Diabetic/Glucose > 140 Description: If diabetic or lab glucose is > 140 mg/dl - Initiate Diabetes/Hyperglycemia Guidelines & Document Interventions  01/26/2024 0708 by Stevens GLENWOOD Con Randine JONETTA, RN Outcome: Progressing 01/26/2024 0708 by Stevens GLENWOOD Con Randine JONETTA, RN Outcome: Progressing 01/26/2024 0708 by Stevens GLENWOOD Con Randine JONETTA, RN Outcome: Progressing 01/26/2024 0708 by Stevens GLENWOOD Con Randine JONETTA, RN Outcome: Progressing 01/26/2024 9292 by Stevens GLENWOOD Con Randine JONETTA, RN Outcome: Progressing   Problem: RH BOWEL ELIMINATION Goal: RH STG MANAGE BOWEL WITH ASSISTANCE Description: STG Manage Bowel with mod I Assistance. 01/26/2024 0708 by Stevens GLENWOOD Con Randine JONETTA, RN Outcome: Progressing 01/26/2024 0708 by Stevens GLENWOOD Con Randine JONETTA, RN Outcome: Progressing 01/26/2024 0708 by Stevens GLENWOOD Con Randine JONETTA, RN Outcome: Progressing 01/26/2024 0708 by Stevens GLENWOOD Con Randine JONETTA, RN Outcome: Progressing 01/26/2024 9292 by Stevens GLENWOOD Con Randine JONETTA, RN Outcome: Progressing Goal: RH STG MANAGE BOWEL W/MEDICATION W/ASSISTANCE Description: STG Manage Bowel with  Medication with mod I Assistance. 01/26/2024 0708 by Stevens GLENWOOD Con Randine JONETTA, RN Outcome: Progressing 01/26/2024 0708 by Stevens GLENWOOD Con Randine JONETTA, RN Outcome: Progressing 01/26/2024 0708 by Stevens GLENWOOD Con Randine JONETTA, RN Outcome: Progressing 01/26/2024 0708 by Stevens GLENWOOD Con Randine JONETTA, RN Outcome: Progressing 01/26/2024 0707 by Stevens GLENWOOD Con Randine JONETTA, RN Outcome: Progressing   Problem: RH BLADDER ELIMINATION Goal: RH STG MANAGE BLADDER WITH ASSISTANCE Description: STG Manage Bladder With min Assistance 01/26/2024 0708 by Stevens GLENWOOD Con Randine JONETTA, RN Outcome: Progressing 01/26/2024 0708 by Stevens GLENWOOD Con Randine JONETTA, RN Outcome: Progressing 01/26/2024 0708 by Stevens GLENWOOD Con Randine JONETTA, RN Outcome: Progressing 01/26/2024 0708 by Stevens GLENWOOD Con Randine JONETTA, RN Outcome: Progressing 01/26/2024 9292 by Stevens GLENWOOD Con Randine JONETTA, RN Outcome: Progressing Goal: RH STG MANAGE BLADDER WITH MEDICATION WITH ASSISTANCE Description: STG Manage Bladder With Medication With mod I Assistance. 01/26/2024 0708 by Stevens GLENWOOD Con Randine JONETTA, RN Outcome: Progressing 01/26/2024 0708 by Stevens GLENWOOD Con Randine JONETTA, RN Outcome: Progressing 01/26/2024 0708 by Stevens GLENWOOD Con Randine JONETTA, RN Outcome: Progressing 01/26/2024 0708 by Stevens GLENWOOD Con Randine JONETTA, RN Outcome: Progressing 01/26/2024 0707 by Stevens GLENWOOD Con Randine JONETTA, RN Outcome: Progressing Goal: RH STG MANAGE BLADDER WITH EQUIPMENT WITH ASSISTANCE Description: STG Manage Bladder With Equipment With min Assistance 01/26/2024 0708 by Stevens GLENWOOD Con Randine JONETTA, RN Outcome: Progressing 01/26/2024 0708 by Stevens GLENWOOD Con Randine JONETTA, RN Outcome: Progressing 01/26/2024 0708 by Stevens GLENWOOD Con Randine JONETTA, RN Outcome: Progressing 01/26/2024 0708 by Stevens GLENWOOD Con Randine JONETTA, RN Outcome: Progressing 01/26/2024 9292 by Stevens GLENWOOD Con Randine JONETTA, RN Outcome: Progressing   Problem: RH SKIN INTEGRITY Goal: RH STG SKIN FREE OF INFECTION/BREAKDOWN Description: Manage skin w min assist  01/26/2024 0708 by  Stevens GLENWOOD Con Randine JONETTA, RN Outcome: Progressing 01/26/2024 0708 by Stevens GLENWOOD Con Randine JONETTA, RN Outcome: Progressing 01/26/2024 0708 by Stevens GLENWOOD Con Randine JONETTA, RN Outcome: Progressing 01/26/2024 0708 by Stevens GLENWOOD Con Randine JONETTA, RN Outcome: Progressing 01/26/2024 9292 by Stevens GLENWOOD Con Randine JONETTA, RN Outcome: Progressing Goal: RH STG MAINTAIN SKIN INTEGRITY WITH ASSISTANCE Description: STG Maintain Skin Integrity With min Assistance. 01/26/2024 0708 by Stevens GLENWOOD Con Randine JONETTA, RN Outcome: Progressing 01/26/2024 0708 by Stevens GLENWOOD Con Randine JONETTA, RN Outcome: Progressing 01/26/2024 0708 by Stevens GLENWOOD Con Randine JONETTA, RN Outcome: Progressing 01/26/2024 0708 by Stevens GLENWOOD Con Randine JONETTA, RN Outcome: Progressing 01/26/2024 9292 by Stevens GLENWOOD Con Randine JONETTA, RN Outcome: Progressing Goal: RH STG ABLE TO PERFORM INCISION/WOUND CARE W/ASSISTANCE Description: STG Able To Perform Incision/Wound Care With min Assistance. 01/26/2024 0708 by Stevens GLENWOOD Con Randine JONETTA, RN Outcome: Progressing 01/26/2024 0708 by Stevens GLENWOOD Con Randine JONETTA, RN Outcome: Progressing 01/26/2024 0708 by Stevens GLENWOOD Con Randine JONETTA, RN Outcome: Progressing 01/26/2024 0708 by Stevens GLENWOOD Con Randine JONETTA, RN Outcome: Progressing 01/26/2024 9292 by Stevens GLENWOOD Con Randine JONETTA, RN Outcome: Progressing   Problem: RH SAFETY Goal: RH STG ADHERE TO SAFETY PRECAUTIONS W/ASSISTANCE/DEVICE Description: STG Adhere to Safety Precautions With cues Assistance/Device. 01/26/2024 0708 by Stevens GLENWOOD Con Randine JONETTA, RN Outcome: Progressing 01/26/2024 0708 by Stevens GLENWOOD Con Randine JONETTA, RN Outcome: Progressing 01/26/2024 0708 by Stevens GLENWOOD Con Randine JONETTA, RN Outcome: Progressing 01/26/2024 0708 by Stevens GLENWOOD Con Randine JONETTA, RN Outcome: Progressing 01/26/2024 0707 by Stevens GLENWOOD Con Randine JONETTA, RN Outcome: Progressing   Problem: RH PAIN MANAGEMENT Goal: RH STG PAIN MANAGED AT OR BELOW PT'S PAIN GOAL Description: Pain < 4 with prns 01/26/2024 0708 by Stevens GLENWOOD Con Randine JONETTA,  RN Outcome: Progressing 01/26/2024 0708 by Stevens GLENWOOD Con Randine JONETTA, RN Outcome: Progressing 01/26/2024 0708 by Stevens GLENWOOD Con Randine JONETTA, RN Outcome: Progressing 01/26/2024 0708 by Stevens GLENWOOD Con Randine JONETTA, RN Outcome: Progressing 01/26/2024 0707 by Stevens GLENWOOD Con Randine JONETTA, RN Outcome: Progressing   Problem: RH KNOWLEDGE DEFICIT Goal: RH STG INCREASE KNOWLEDGE OF DIABETES Description: Patient and wife will be able to manage DM using educational resources for medication and dietary modification independently 01/26/2024 0708 by Stevens GLENWOOD Con Randine JONETTA, RN Outcome: Progressing 01/26/2024 0708 by Stevens GLENWOOD Con Randine JONETTA, RN Outcome: Progressing 01/26/2024 0708 by Stevens GLENWOOD Con Randine JONETTA, RN Outcome: Progressing 01/26/2024 0708 by Stevens GLENWOOD Con Randine JONETTA, RN Outcome: Progressing 01/26/2024 0707 by Stevens GLENWOOD Con Randine JONETTA, RN Outcome: Progressing Goal: RH STG INCREASE KNOWLEDGE OF HYPERTENSION Description: Patient and wife will be able to manage HTN using educational resources for medication and dietary modification independently 01/26/2024 0708 by Stevens GLENWOOD Con Randine JONETTA, RN Outcome: Progressing 01/26/2024 0708 by Stevens GLENWOOD Con Randine JONETTA, RN Outcome: Progressing 01/26/2024 0708 by Stevens GLENWOOD Con Randine JONETTA, RN Outcome: Progressing 01/26/2024 0708 by Stevens GLENWOOD Con Randine JONETTA, RN Outcome: Progressing 01/26/2024 0707 by Stevens GLENWOOD Con Randine JONETTA, RN Outcome: Progressing Goal: RH STG INCREASE KNOWLEGDE OF HYPERLIPIDEMIA Description: Patient and wife will be able to manage HLD using educational resources for medication and dietary modification independently 01/26/2024 0708 by Stevens GLENWOOD Con Randine JONETTA, RN Outcome: Progressing 01/26/2024 0708 by Stevens GLENWOOD Con Randine JONETTA, RN Outcome: Progressing 01/26/2024 0708 by Stevens GLENWOOD Con Randine JONETTA, RN Outcome: Progressing 01/26/2024 0708 by Stevens GLENWOOD Con Randine JONETTA, RN Outcome: Progressing 01/26/2024 0707 by Stevens -  Con Randine BIRCH, RN Outcome: Progressing Goal: RH STG  INCREASE KNOWLEDGE OF STROKE PROPHYLAXIS Description: Patient and wife will be able to manage secondary risks using educational resources for medication and dietary modification independently 01/26/2024 0708 by Stevens GLENWOOD Con Randine BIRCH, RN Outcome: Progressing 01/26/2024 0708 by Stevens GLENWOOD Con Randine BIRCH, RN Outcome: Progressing 01/26/2024 0708 by Stevens GLENWOOD Con Randine BIRCH, RN Outcome: Progressing 01/26/2024 0708 by Stevens GLENWOOD Con Randine BIRCH, RN Outcome: Progressing 01/26/2024 0707 by Stevens GLENWOOD Con Randine BIRCH, RN Outcome: Progressing

## 2024-01-26 NOTE — Progress Notes (Signed)
 Pt noted to have nausea resulting in one episode of vomiting which can be described as being a medium amount, tan to brown in color. Pt refused HS meds out of fear of vomiting again. Writer educated pt on the benefits of continuing meds as ordered, Clinical research associate even offered to give meds after administering antiemetic but pt declined. Antiemetic administered per MD orders.

## 2024-01-26 NOTE — Care Management (Signed)
 Inpatient Rehabilitation Center Individual Statement of Services  Patient Name:  John [name]  Date:  01/26/2024  Welcome to the Inpatient Rehabilitation Center.  Our goal is to provide you with an individualized program based on your diagnosis and situation, designed to meet your specific needs.  With this comprehensive rehabilitation program, you will be expected to participate in at least 3 hours of rehabilitation therapies Monday-Friday, with modified therapy programming on the weekends.  Your rehabilitation program will include the following services:  Physical Therapy (PT), Occupational Therapy (OT), 24 hour per day rehabilitation nursing, Therapeutic Recreaction (TR), Psychology, Neuropsychology, Care Coordinator, Rehabilitation Medicine, Nutrition Services, Pharmacy Services, and Other  Weekly team conferences will be held on Tuesday to discuss your progress.  Your Inpatient Rehabilitation Care Coordinator will talk with you frequently to get your input and to update you on team discussions.  Team conferences with you and your family in attendance may also be held.  Expected length of stay: 10-21 days    Overall anticipated outcome: Supervision  Depending on your progress and recovery, your program may change. Your Inpatient Rehabilitation Care Coordinator will coordinate services and will keep you informed of any changes. Your Inpatient Rehabilitation Care Coordinator's name and contact numbers are listed  below.  The following services may also be recommended but are not provided by the Inpatient Rehabilitation Center:  Driving Evaluations Home Health Rehabiltiation Services Outpatient Rehabilitation Services Vocational Rehabilitation   Arrangements will be made to provide these services after discharge if needed.  Arrangements include referral to agencies that provide these services.  Your insurance has been verified to be:  [employer]  Your primary doctor is:  Mabel Pry  Pertinent information will be shared with your doctor and your insurance company.  Inpatient Rehabilitation Care Coordinator:  [name], KEN [phone] or [phone]  Information discussed with and copy given to patient by: [name] [name] [name], 01/26/2024, 1:27 PM

## 2024-01-26 NOTE — Plan of Care (Signed)
  Problem: Consults Goal: RH STROKE PATIENT EDUCATION Description: See Patient Education module for education specifics  Outcome: Progressing Goal: Nutrition Consult-if indicated Outcome: Progressing Goal: Diabetes Guidelines if Diabetic/Glucose > 140 Description: If diabetic or lab glucose is > 140 mg/dl - Initiate Diabetes/Hyperglycemia Guidelines & Document Interventions  Outcome: Progressing   Problem: RH BOWEL ELIMINATION Goal: RH STG MANAGE BOWEL WITH ASSISTANCE Description: STG Manage Bowel with mod I Assistance. Outcome: Progressing Goal: RH STG MANAGE BOWEL W/MEDICATION W/ASSISTANCE Description: STG Manage Bowel with Medication with mod I Assistance. Outcome: Progressing   Problem: RH BLADDER ELIMINATION Goal: RH STG MANAGE BLADDER WITH ASSISTANCE Description: STG Manage Bladder With min Assistance Outcome: Progressing Goal: RH STG MANAGE BLADDER WITH MEDICATION WITH ASSISTANCE Description: STG Manage Bladder With Medication With mod I Assistance. Outcome: Progressing Goal: RH STG MANAGE BLADDER WITH EQUIPMENT WITH ASSISTANCE Description: STG Manage Bladder With Equipment With min Assistance Outcome: Progressing   Problem: RH SKIN INTEGRITY Goal: RH STG SKIN FREE OF INFECTION/BREAKDOWN Description: Manage skin w min assist Outcome: Progressing Goal: RH STG MAINTAIN SKIN INTEGRITY WITH ASSISTANCE Description: STG Maintain Skin Integrity With min Assistance. Outcome: Progressing Goal: RH STG ABLE TO PERFORM INCISION/WOUND CARE W/ASSISTANCE Description: STG Able To Perform Incision/Wound Care With min Assistance. Outcome: Progressing   Problem: RH SAFETY Goal: RH STG ADHERE TO SAFETY PRECAUTIONS W/ASSISTANCE/DEVICE Description: STG Adhere to Safety Precautions With cues Assistance/Device. Outcome: Progressing   Problem: RH PAIN MANAGEMENT Goal: RH STG PAIN MANAGED AT OR BELOW PT'S PAIN GOAL Description: Pain < 4 with prns Outcome: Progressing   Problem: RH  KNOWLEDGE DEFICIT Goal: RH STG INCREASE KNOWLEDGE OF DIABETES Description: Patient and wife will be able to manage DM using educational resources for medication and dietary modification independently Outcome: Progressing Goal: RH STG INCREASE KNOWLEDGE OF HYPERTENSION Description: Patient and wife will be able to manage HTN using educational resources for medication and dietary modification independently Outcome: Progressing Goal: RH STG INCREASE KNOWLEGDE OF HYPERLIPIDEMIA Description: Patient and wife will be able to manage HLD using educational resources for medication and dietary modification independently Outcome: Progressing Goal: RH STG INCREASE KNOWLEDGE OF STROKE PROPHYLAXIS Description: Patient and wife will be able to manage secondary risks using educational resources for medication and dietary modification independently Outcome: Progressing

## 2024-01-26 NOTE — Evaluation (Signed)
 Speech Language Pathology Assessment and Plan  Patient Details  Name: John [name] MRN: [id number] Date of Birth: [birth year]  SLP Diagnosis: Cognitive Impairments  Rehab Potential: Good ELOS: 2-3 weeks   Today's Date: 01/26/2024 SLP Individual Time: 0730-0830 SLP Individual Time Calculation (min): 60 min  Hospital Problem: Principal Problem:   Right pontine stroke [facility])  Past Medical History:  Past Medical History:  Diagnosis Date   BPH (benign prostatic hyperplasia)    CTS (carpal tunnel syndrome)    Depression    Diabetes mellitus    Essential hypertension    GERD (gastroesophageal reflux disease)    History of breast cancer    Hypercholesteremia    Neuropathy    Stroke [facility])    Past Surgical History:  Past Surgical History:  Procedure Laterality Date   CATARACT EXTRACTION, BILATERAL  01/18/2017   CORONARY STENT INTERVENTION N/A 08/10/2023   Procedure: CORONARY STENT INTERVENTION;  Surgeon: Darron Deatrice LABOR, MD;  Location: [facility] INVASIVE CV LAB;  Service: Cardiovascular;  Laterality: N/A;   LEFT HEART CATH AND CORONARY ANGIOGRAPHY N/A 08/10/2023   Procedure: LEFT HEART CATH AND CORONARY ANGIOGRAPHY;  Surgeon: Darron Deatrice LABOR, MD;  Location: [facility] INVASIVE CV LAB;  Service: Cardiovascular;  Laterality: N/A;   LITHOTRIPSY     MASTECTOMY Right 05/02/2018   PACEMAKER IMPLANT N/A 05/13/2023   Procedure: PACEMAKER IMPLANT;  Surgeon: Kennyth Chew, MD;  Location: [facility] INVASIVE CV LAB;  Service: Cardiovascular;  Laterality: N/A;   TEMPORARY PACEMAKER N/A 05/11/2023   Procedure: TEMPORARY PACEMAKER;  Surgeon: Swazil[place], Peter M, MD;  Location: [facility] INVASIVE CV LAB;  Service: Cardiovascular;  Laterality: N/A;   TONSILLECTOMY AND ADENOIDECTOMY     TRANSESOPHAGEAL ECHOCARDIOGRAM (CATH LAB) N/A 12/08/2023   Procedure: TRANSESOPHAGEAL ECHOCARDIOGRAM;  Surgeon: Raford Riggs, MD;  Location: [facility] INVASIVE CV LAB;  Service: Cardiovascular;  Laterality: N/A;   Assessment / Plan /  Recommendation Clinical Impression  Pt is a 78 year old male with medical hx significant for: HTN, hyperlipidemai, DM II, h/o CVA, cognitive deficits, CAD, previous DVT, urinary retention with Foley catheter in place, PNA. Pt recently admitted to hospital from September 15-22 d/t intractable nausea and vomiting. Imaging showed basal ganglia lacunar infarcts. Pt discharged to [facility]. Pt discharged from [facility] on 01/16/24. Pt presented to [facility] on 01/16/24 d/t left-sided weakness. CT head negative for acute abnormalities. Chest x-ray negative. Abnormal urinalysis. Urine culture grew ESBL E.Coli. Pt started on Rocephin . Neurology consulted. Pt transferred to [facility] on 01/18/24. MRI was positive for right pontine infarct with possible punctate petechial hemorrhage. 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 Therapy evaluations completed and CIR recommended d/t pt's deficits in functional mobility.   Cognitive-Linguistic Evaluation: Patient presents with cognitive impairments in the areas of short term recall, problem solving, working memory, attention, orientation, and awareness of deficits. Patient was pleasant throughout, however appeared to lack true engagement during cognitive screening measures - laughing at questions, questioning purpose of SLP and test, argumentative, and making fun of material rather than providing true answers. This behavior made assessing true cognitive abilities difficult during session, though patient did seem to demonstrate impairments as listed above during functional tasks and conversation. These deficits were later corroborated during phone call with wife. Wife reports that though patient did have some mild lingering cognitive deficits from prior CVA, new cognitive deficits are stark, especially in the area of short term memory. She notes  preservation of long term recall. Patient's awareness of deficits across  the board is reduced,  with patient stating the only palpable change since most recent stroke is a change in leg strength. Patient endorses that there have been no changes to cognition. At baseline, patient's wife manages all iADLs including finances and medications. Across all tasks, no deficits in receptive/expressive language nor motor speech identified.  *Of note, swallowing function was briefly screened, as patient consumed regular/thin breakfast items during beginning portion of session. No changes in swallowing function endorsed by patient nor overt s/sx/difficulty with all meal items during meal consumption. Due to no indications from patient, chart, or observations, swallowing was not formally further evaluated.  Patient would benefit from further SLP services targeted aforementioned cognitive deficits during CIR admission. Patient was left in lowered bed with call bell in reach and bed alarm set. SLP will continue to target goals per plan of care.     Skilled Therapeutic Interventions          SLP conducted skilled evaluation session to assess cognitive-linguistic function. Utilized SLUMS and patient and/or family interview. Full results above.   SLP Assessment  Patient will need skilled Speech Lanaguage Pathology Services during CIR admission    Recommendations  Patient destination: Home Follow up Recommendations: 24 hour supervision/assistance;Home Health SLP;Outpatient SLP Equipment Recommended: None recommended by SLP    SLP Frequency 3 to 5 out of 7 days   SLP Duration  SLP Intensity  SLP Treatment/Interventions 2-3 weeks  Minumum of 1-2 x/day, 30 to 90 minutes  Cognitive remediation/compensation;Environmental Economist;Therapeutic Activities;Functional tasks;Internal/external aids;Therapeutic Exercise;Patient/family education    Pain Pain Assessment Pain Scale: 0-10 Pain Score: 0-No pain  Prior Functioning Cognitive/Linguistic Baseline: Baseline deficits Baseline deficit  details: cognitive lingustic changes from previous CVA Type of Home: House  Lives With: Spouse Available Help at Discharge: Family Vocation: Retired  SLP Evaluation Cognition Overall Cognitive Status: Impaired/Different from baseline Arousal/Alertness: Awake/alert Orientation Level: Oriented to person;Oriented to place;Oriented to situation;Disoriented to time Year: 2025 Day of Week: Incorrect Attention: Sustained Sustained Attention: Impaired Sustained Attention Impairment: Verbal basic;Functional basic Memory: Impaired Memory Impairment: Decreased short term memory;Storage deficit Decreased Short Term Memory: Verbal basic;Functional basic Awareness: Impaired Awareness Impairment: Intellectual impairment Problem Solving: Impaired Problem Solving Impairment: Verbal basic;Functional basic Executive Function: Initiating;Self Monitoring Initiating: Impaired Initiating Impairment: Functional basic Self Monitoring: Impaired Self Monitoring Impairment: Functional basic;Verbal basic Safety/Judgment: Impaired  Comprehension Auditory Comprehension Overall Auditory Comprehension: Appears within functional limits for tasks assessed Yes/No Questions: Within Functional Limits Conversation: Simple Interfering Components: Attention;Hearing;Working memory EffectiveTechniques: Increased volume;Slowed speech Expression Expression Primary Mode of Expression: Verbal Verbal Expression Overall Verbal Expression: Appears within functional limits for tasks assessed Level of Generative/Spontaneous Verbalization: Conversation Naming: Impairment Responsive: Not tested Confrontation: Not tested Convergent: Not tested Divergent: 0-24% accurate (due to cognition deficits, not a true language impairment) Interfering Components: Attention Oral Motor Oral Motor/Sensory Function Overall Oral Motor/Sensory Function: Within functional limits  Care Tool Care Tool Cognition Ability to hear (with  hearing aid or hearing appliances if normally used Ability to hear (with hearing aid or hearing appliances if normally used): 1. Minimal difficulty - difficulty in some environments (e.g. when person speaks softly or setting is noisy)   Expression of Ideas and Wants Expression of Ideas and Wants: 4. Without difficulty (complex and basic) - expresses complex messages without difficulty and with speech that is clear and easy to understand   Understanding Verbal and Non-Verbal Content Understanding Verbal and Non-Verbal Content: 3. Usually understands - understands most conversations, but misses some part/intent of message. Requires  cues at times to understand  Memory/Recall Ability Memory/Recall Ability : That he or she is in a hospital/hospital unit;Current season   Short Term Goals: Week 1: SLP Short Term Goal 1 (Week 1): Patient will utilize WRAP memory strategies to recall daily information given maxA SLP Short Term Goal 2 (Week 1): During speech therapy tasks, patient will sustain attention for intervals given maxA SLP Short Term Goal 3 (Week 1): Patient will orient to time using external aids given modA SLP Short Term Goal 4 (Week 1): Patient will state 2 cognitive deficits given maxA  Refer to Care Plan for Long Term Goals  Recommendations for other services: Neuropsych  Discharge Criteria: Patient will be discharged from SLP if patient refuses treatment 3 consecutive times without medical reason, if treatment goals not met, if there is a change in medical status, if patient makes no progress towards goals or if patient is discharged from hospital.  The above assessment, treatment plan, treatment alternatives and goals were discussed and mutually agreed upon: by patient  Kendale Rembold, M.A., CCC-SLP  Deadra Diggins A Shawnae Leiva 01/26/2024, 3:34 PM

## 2024-01-26 NOTE — Plan of Care (Signed)
 Problem: Consults Goal: RH STROKE PATIENT EDUCATION Description: See Patient Education module for education specifics  01/26/2024 0708 by Stevens John Con John JONETTA, RN Outcome: Progressing 01/26/2024 0708 by Stevens John Con John JONETTA, RN Outcome: Progressing 01/26/2024 0707 by Stevens John Con John JONETTA, RN Outcome: Progressing Goal: Nutrition Consult-if indicated 01/26/2024 0708 by Stevens John Con John JONETTA, RN Outcome: Progressing 01/26/2024 0708 by Stevens John Con John JONETTA, RN Outcome: Progressing 01/26/2024 [id number] by Stevens John Con John JONETTA, RN Outcome: Progressing Goal: Diabetes Guidelines if Diabetic/Glucose > 140 Description: If diabetic or lab glucose is > 140 mg/dl - Initiate Diabetes/Hyperglycemia Guidelines & Document Interventions  01/26/2024 0708 by Stevens John Con John JONETTA, RN Outcome: Progressing 01/26/2024 0708 by Stevens John Con John JONETTA, RN Outcome: Progressing 01/26/2024 0707 by Stevens John Con John JONETTA, RN Outcome: Progressing   Problem: RH BOWEL ELIMINATION Goal: RH STG MANAGE BOWEL WITH ASSISTANCE Description: STG Manage Bowel with mod I Assistance. 01/26/2024 0708 by Stevens John Con John JONETTA, RN Outcome: Progressing 01/26/2024 0708 by Stevens John Con John JONETTA, RN Outcome: Progressing 01/26/2024 0707 by Stevens John Con John JONETTA, RN Outcome: Progressing Goal: RH STG MANAGE BOWEL W/MEDICATION W/ASSISTANCE Description: STG Manage Bowel with Medication with mod I Assistance. 01/26/2024 0708 by Stevens John Con John JONETTA, RN Outcome: Progressing 01/26/2024 0708 by Stevens John Con John JONETTA, RN Outcome: Progressing 01/26/2024 0707 by Stevens John Con John JONETTA, RN Outcome: Progressing   Problem: RH BLADDER ELIMINATION Goal: RH STG MANAGE BLADDER WITH ASSISTANCE Description: STG Manage Bladder With min Assistance 01/26/2024 0708 by Stevens John Con John JONETTA, RN Outcome: Progressing 01/26/2024 0708 by Stevens John Con John JONETTA, RN Outcome: Progressing 01/26/2024 0707 by Stevens John Con John JONETTA,  RN Outcome: Progressing Goal: RH STG MANAGE BLADDER WITH MEDICATION WITH ASSISTANCE Description: STG Manage Bladder With Medication With mod I Assistance. 01/26/2024 0708 by Stevens John Con John JONETTA, RN Outcome: Progressing 01/26/2024 0708 by Stevens John Con John JONETTA, RN Outcome: Progressing 01/26/2024 0707 by Stevens John Con John JONETTA, RN Outcome: Progressing Goal: RH STG MANAGE BLADDER WITH EQUIPMENT WITH ASSISTANCE Description: STG Manage Bladder With Equipment With min Assistance 01/26/2024 0708 by Stevens John Con John JONETTA, RN Outcome: Progressing 01/26/2024 0708 by Stevens John Con John JONETTA, RN Outcome: Progressing 01/26/2024 0707 by Stevens John Con John JONETTA, RN Outcome: Progressing   Problem: RH SKIN INTEGRITY Goal: RH STG SKIN FREE OF INFECTION/BREAKDOWN Description: Manage skin w min assist 01/26/2024 0708 by Stevens John Con John JONETTA, RN Outcome: Progressing 01/26/2024 0708 by Stevens John Con John JONETTA, RN Outcome: Progressing 01/26/2024 0707 by Stevens John Con John JONETTA, RN Outcome: Progressing Goal: RH STG MAINTAIN SKIN INTEGRITY WITH ASSISTANCE Description: STG Maintain Skin Integrity With min Assistance. 01/26/2024 0708 by Stevens John Con John JONETTA, RN Outcome: Progressing 01/26/2024 0708 by Stevens John Con John JONETTA, RN Outcome: Progressing 01/26/2024 0707 by Stevens John Con John JONETTA, RN Outcome: Progressing Goal: RH STG ABLE TO PERFORM INCISION/WOUND CARE W/ASSISTANCE Description: STG Able To Perform Incision/Wound Care With min Assistance. 01/26/2024 0708 by Stevens John Con John JONETTA, RN Outcome: Progressing 01/26/2024 0708 by Stevens John Con John JONETTA, RN Outcome: Progressing 01/26/2024 0707 by Stevens John Con John JONETTA, RN Outcome: Progressing   Problem: RH SAFETY Goal: RH STG ADHERE TO SAFETY PRECAUTIONS W/ASSISTANCE/DEVICE Description: STG Adhere to Safety Precautions With cues Assistance/Device. 01/26/2024 0708 by Stevens John Con John JONETTA, RN Outcome: Progressing 01/26/2024 0708 by Stevens John Con John JONETTA,  RN Outcome: Progressing 01/26/2024 0707 by Stevens John Con John JONETTA, RN Outcome: Progressing   Problem: RH PAIN MANAGEMENT Goal: RH STG PAIN MANAGED AT OR BELOW PT'S PAIN GOAL Description: Pain < 4 with prns 01/26/2024 0708 by Stevens John Con John JONETTA, RN Outcome: Progressing 01/26/2024 0708 by Stevens John Con John JONETTA, RN Outcome: Progressing 01/26/2024 0707 by Stevens John Con John JONETTA, RN Outcome: Progressing   Problem: RH KNOWLEDGE DEFICIT Goal: RH STG INCREASE KNOWLEDGE OF DIABETES Description: Patient and wife will be able to manage DM using educational resources for medication and dietary modification independently 01/26/2024 0708 by Stevens John Con John JONETTA, RN Outcome: Progressing 01/26/2024 0708 by Stevens John Con John JONETTA, RN Outcome: Progressing 01/26/2024 0707 by Stevens John Con John JONETTA, RN Outcome: Progressing Goal: RH STG INCREASE KNOWLEDGE OF HYPERTENSION Description: Patient and wife will be able to manage HTN using educational resources for medication and dietary modification independently 01/26/2024 0708 by Stevens John Con John JONETTA, RN Outcome: Progressing 01/26/2024 0708 by Stevens John Con John JONETTA, RN Outcome: Progressing 01/26/2024 0707 by Stevens John Con John JONETTA, RN Outcome: Progressing Goal: RH STG INCREASE KNOWLEGDE OF HYPERLIPIDEMIA Description: Patient and wife will be able to manage HLD using educational resources for medication and dietary modification independently 01/26/2024 0708 by Stevens John Con John JONETTA, RN Outcome: Progressing 01/26/2024 0708 by Stevens John Con John JONETTA, RN Outcome: Progressing 01/26/2024 0707 by Stevens John Con John JONETTA, RN Outcome: Progressing Goal: RH STG INCREASE KNOWLEDGE OF STROKE PROPHYLAXIS Description: Patient and wife will be able to manage secondary risks using educational resources for medication and dietary modification independently 01/26/2024 0708 by Stevens John Con John JONETTA, RN Outcome: Progressing 01/26/2024  0708 by Stevens John Con John JONETTA, RN Outcome: Progressing 01/26/2024 0707 by Stevens John Con John JONETTA, RN Outcome: Progressing

## 2024-01-27 LAB — GLUCOSE, CAPILLARY
Glucose-Capillary: 206 mg/dL — ABNORMAL HIGH (ref 70–99)
Glucose-Capillary: 255 mg/dL — ABNORMAL HIGH (ref 70–99)
Glucose-Capillary: 227 mg/dL — ABNORMAL HIGH (ref 70–99)
Glucose-Capillary: 124 mg/dL — ABNORMAL HIGH (ref 70–99)
Glucose-Capillary: 160 mg/dL — ABNORMAL HIGH (ref 70–99)

## 2024-01-27 MED ORDER — INSULIN GLARGINE-YFGN 100 UNIT/ML ~~LOC~~ SOLN
15.0000 [IU] | Freq: Every day | SUBCUTANEOUS | Status: DC
  Administered 2024-01-27: 15 [IU] via SUBCUTANEOUS
  Filled 2024-01-27 (×2): qty 0.15

## 2024-01-27 MED ORDER — BACLOFEN 5 MG HALF TABLET
5.0000 mg | ORAL_TABLET | Freq: Three times a day (TID) | ORAL | Status: DC | PRN
  Filled 2024-01-27: qty 1

## 2024-01-27 MED ORDER — INSULIN GLARGINE-YFGN 100 UNIT/ML ~~LOC~~ SOLN
10.0000 [IU] | Freq: Every day | SUBCUTANEOUS | Status: DC
  Administered 2024-01-28: 10 [IU] via SUBCUTANEOUS
  Filled 2024-01-27 (×2): qty 0.1

## 2024-01-27 MED ORDER — MODAFINIL 100 MG PO TABS: 100.0000 mg | ORAL_TABLET | Freq: Every day | ORAL | Status: DC

## 2024-01-27 MED ORDER — METHYLPHENIDATE HCL 5 MG PO TABS
5.0000 mg | ORAL_TABLET | Freq: Two times a day (BID) | ORAL | Status: DC
  Administered 2024-01-28: 5 mg via ORAL
  Filled 2024-01-27: qty 1

## 2024-01-27 NOTE — Plan of Care (Signed)
  Problem: Consults Goal: RH STROKE PATIENT EDUCATION Description: See Patient Education module for education specifics  Outcome: Progressing Goal: Nutrition Consult-if indicated Outcome: Progressing Goal: Diabetes Guidelines if Diabetic/Glucose > 140 Description: If diabetic or lab glucose is > 140 mg/dl - Initiate Diabetes/Hyperglycemia Guidelines & Document Interventions  Outcome: Progressing   Problem: RH BOWEL ELIMINATION Goal: RH STG MANAGE BOWEL WITH ASSISTANCE Description: STG Manage Bowel with mod I Assistance. Outcome: Progressing Goal: RH STG MANAGE BOWEL W/MEDICATION W/ASSISTANCE Description: STG Manage Bowel with Medication with mod I Assistance. Outcome: Progressing   Problem: RH BLADDER ELIMINATION Goal: RH STG MANAGE BLADDER WITH ASSISTANCE Description: STG Manage Bladder With min Assistance Outcome: Progressing Goal: RH STG MANAGE BLADDER WITH MEDICATION WITH ASSISTANCE Description: STG Manage Bladder With Medication With mod I Assistance. Outcome: Progressing Goal: RH STG MANAGE BLADDER WITH EQUIPMENT WITH ASSISTANCE Description: STG Manage Bladder With Equipment With min Assistance Outcome: Progressing   Problem: RH SKIN INTEGRITY Goal: RH STG SKIN FREE OF INFECTION/BREAKDOWN Description: Manage skin w min assist Outcome: Progressing Goal: RH STG MAINTAIN SKIN INTEGRITY WITH ASSISTANCE Description: STG Maintain Skin Integrity With min Assistance. Outcome: Progressing Goal: RH STG ABLE TO PERFORM INCISION/WOUND CARE W/ASSISTANCE Description: STG Able To Perform Incision/Wound Care With min Assistance. Outcome: Progressing   Problem: RH SAFETY Goal: RH STG ADHERE TO SAFETY PRECAUTIONS W/ASSISTANCE/DEVICE Description: STG Adhere to Safety Precautions With cues Assistance/Device. Outcome: Progressing   Problem: RH PAIN MANAGEMENT Goal: RH STG PAIN MANAGED AT OR BELOW PT'S PAIN GOAL Description: Pain < 4 with prns Outcome: Progressing   Problem: RH  KNOWLEDGE DEFICIT Goal: RH STG INCREASE KNOWLEDGE OF DIABETES Description: Patient and wife will be able to manage DM using educational resources for medication and dietary modification independently Outcome: Progressing Goal: RH STG INCREASE KNOWLEDGE OF HYPERTENSION Description: Patient and wife will be able to manage HTN using educational resources for medication and dietary modification independently Outcome: Progressing Goal: RH STG INCREASE KNOWLEGDE OF HYPERLIPIDEMIA Description: Patient and wife will be able to manage HLD using educational resources for medication and dietary modification independently Outcome: Progressing Goal: RH STG INCREASE KNOWLEDGE OF STROKE PROPHYLAXIS Description: Patient and wife will be able to manage secondary risks using educational resources for medication and dietary modification independently Outcome: Progressing

## 2024-01-27 NOTE — Progress Notes (Signed)
 Occupational Therapy Session Note  Patient Details  Name: John [name] MRN: [id number] Date of Birth: [birth year]  Today's Date: 01/27/2024 OT Individual Time: 9194-9143 OT Individual Time Calculation (min): 51 min   Today's Date: 01/27/2024 OT Individual Time: 1350-1415 OT Individual Time Calculation (min): 25 min   Short Term Goals: Week 1:  OT Short Term Goal 1 (Week 1): Pt will complete LB dressing at Max A with AE as necessary OT Short Term Goal 2 (Week 1): Pt will complete toilet transfer at Max A with LRAD OT Short Term Goal 3 (Week 1): Pt will use Lt hand as stabilizer during ADL activities with Mod VCs  Skilled Therapeutic Interventions/Progress Updates:   Session 1: Pt greeted resting in bed, increased multimodal cuing required to arouse for therapeutic participation. Continued decreased initiation and verbal communication, unsure of due to cognitive impairment or behavioral components.   Pain: Pt communicates no pain with head shake. OT offering intermediate rest breaks and positioning suggestions throughout session to address potential pain/fatigue and maximize participation/safety in session.   Functional Transfers: Bed mobility and stand-pivot from EOB>TIS WC with Max A (+2 assisting with guiding hips).   Self Care Tasks: OT assists with cutting breakfast foods and donning onto utensil to manage decreased initiation. Music played in background to promote alertness during meal. Pt consumes ~20%, requiring continued donning of food on utensil otherwise observed to be falling back asleep. Sink-side oral care and face washing with support for sitting balance due to strong L lateral lean, cuing required to terminate task as patient continues to brush his teeth despite spitting red tinted saliva. Face-washing with Min A for thoroughness of care.   Pt remained sitting in TIS WC, posey belt donned, with 4Ps assessed and immediate needs met. Pt continues to be appropriate for  skilled OT intervention to promote further functional independence in ADLs/IADLs.   Session 2: Pt greeted resting in bed for skilled OT session with focus on bed-level theraputic exercises for carryover into ADL participation and functional transfers.   Pain: Pt with no verbal reports of pain, intermediate grimacing with ROM. OT offering intermediate rest breaks and positioning suggestions throughout session to address pain/fatigue and maximize participation/safety in session.   Therapeutic Exercise: Pt instructed in the exercises below: Hip abudction 1x5 LUE flexion 1x5  Ankle pumps 1x5 (manual assistance provided for terminal range) LUE abduction 1x5 LLE SAQ 1x5  LUE elbow flexion/extension (manual assistance provided for terminal range)  Multimodal cuing provided for correct musculature activation a.   Cognition: Pt initially frustrated with spending 3 hours in that chair! Eventually confabulating thoughts into I spent 3 hours in the well, all alone and unable to talk. OT provides orientation to purpose of session and location.    Pt remained resting in bed with 4Ps assessed and immediate needs met. Pt continues to be appropriate for skilled OT intervention to promote further functional independence in ADLs/IADLs.   Therapy Documentation Precautions:  Precautions Precautions: Fall Recall of Precautions/Restrictions: Impaired Precaution/Restrictions Comments: L hemiparesis Restrictions Weight Bearing Restrictions Per Provider Order: Yes Other Position/Activity Restrictions: L sided weakness   Therapy/Group: Individual Therapy  [name], OTR/L, MSOT  01/27/2024, 7:52 AM

## 2024-01-27 NOTE — Progress Notes (Signed)
 Physical Therapy Session Note  Patient Details  Name: John [name] MRN: [id number] Date of Birth: [birth year]  Today's Date: 01/27/2024 PT Individual Time: 1100-1154 PT Individual Time Calculation (min): 54 min   Short Term Goals: Week 1:  PT Short Term Goal 1 (Week 1): Pt will perform supine<->sit with Min A PT Short Term Goal 2 (Week 1): Pt will amb x25' using RW, CGA PT Short Term Goal 3 (Week 1): pt will transfer sit<->stand with SBA following proper sequencing 80% of time  Skilled Therapeutic Interventions/Progress Updates:   Received pt sitting in TIS WC, pt flat but reluctantly agreeable to PT treatment and denied any pain during session. Session with emphasis on functional mobility/transfers, generalized strengthening and endurance, dynamic standing balance/coordination, NMR, and ambulation.   Pt transported to/from room in TIS WC dependently. Donned LiteGait harness seated with +2 assist, then stood at [employer] with heavy mod A +2 and required +2 assist to fasten harness due to strong L lean and inability to extend L knee. Pt ambulated 82ft in Litegait with intermittent assist to advance BLE. Pt essentially hanging in harness and unable (possibly unwilling?) to stand tall and extend knees despite cues and max/total A. Worked on static standing balance in Litegait, but again pt requiring max/total A to stand upright with tendency to lean L and hang in harness. Pt reported hating activity and refused any further ambulation.   Pt performed seated BLE strengthening on Kinetron at 20 cm/sec for 1 minute x 4 trials with emphasis on glute/quad strength. Returned to room and pt requested to return to bed retrieved Stedy and rehab tech and pt stood in [employer] with mod A (+2 providing min A) - of note, on this stand pt able to come fully upright and with no lean noted. Stood from [employer] flaps with CGA and transitioned into supine with max A for BLE management. Scooted to [facility] with total A using  Trendelenburg bed position. Concluded session with pt semi-reclined in bed with all needs within reach.   Therapy Documentation Precautions:  Precautions Precautions: Fall Recall of Precautions/Restrictions: Impaired Precaution/Restrictions Comments: L hemiparesis Restrictions Weight Bearing Restrictions Per Provider Order: Yes Other Position/Activity Restrictions: L sided weakness  Therapy/Group: Individual Therapy [name] [name] PT, DPT 01/27/2024, 7:08 AM

## 2024-01-27 NOTE — Progress Notes (Signed)
 Physical Therapy Session Note  Patient Details  Name: John [name] MRN: [id number] Date of Birth: [birth year]  Today's Date: 01/27/2024 PT Individual Time: 8582-8554 PT Individual Time Calculation (min): 28 min   Short Term Goals: Week 1:  PT Short Term Goal 1 (Week 1): Pt will perform supine<->sit with Min A PT Short Term Goal 2 (Week 1): Pt will amb x25' using RW, CGA PT Short Term Goal 3 (Week 1): pt will transfer sit<->stand with SBA following proper sequencing 80% of time  Skilled Therapeutic Interventions/Progress Updates: Pt presents supine in bed and required encouragement to participate w/ LE there ex.  Pt refused OOB and even sitting EOB.  Pt required increased time to build rapport'.  Pt performed LE there ex to increase strength.  Pt performed AP 3 x 15, HS 3 x 10 w/ AAROM to L for increased ROM, manual resistance to extension, abd/add 3 x 10, bridges x 5 w/ assist into L knee flexion, supine clamshell 2 x 10.  Pt remained supine in bed w/ all needs in reach.     Therapy Documentation Precautions:  Precautions Precautions: Fall Recall of Precautions/Restrictions: Impaired Precaution/Restrictions Comments: L hemiparesis Restrictions Weight Bearing Restrictions Per Provider Order: Yes Other Position/Activity Restrictions: L sided weakness General:   Vital Signs:   Pain: 0/10     Therapy/Group: Individual Therapy  John [name] P [name] 01/27/2024, 2:53 PM

## 2024-01-27 NOTE — Progress Notes (Signed)
 Inpatient Rehabilitation Care Coordinator Assessment and Plan Patient Details  Name: John [name] MRN: [id number] Date of Birth: 06/13/1945  Today's Date: 01/27/2024  Hospital Problems: Principal Problem:   Right pontine stroke [facility])  Past Medical History:  Past Medical History:  Diagnosis Date   BPH (benign prostatic hyperplasia)    CTS (carpal tunnel syndrome)    Depression    Diabetes mellitus    Essential hypertension    GERD (gastroesophageal reflux disease)    History of breast cancer    Hypercholesteremia    Neuropathy    Stroke [facility])    Past Surgical History:  Past Surgical History:  Procedure Laterality Date   CATARACT EXTRACTION, BILATERAL  01/18/2017   CORONARY STENT INTERVENTION N/A 08/10/2023   Procedure: CORONARY STENT INTERVENTION;  Surgeon: Darron Deatrice LABOR, MD;  Location: [facility] INVASIVE CV LAB;  Service: Cardiovascular;  Laterality: N/A;   LEFT HEART CATH AND CORONARY ANGIOGRAPHY N/A 08/10/2023   Procedure: LEFT HEART CATH AND CORONARY ANGIOGRAPHY;  Surgeon: Darron Deatrice LABOR, MD;  Location: [facility] INVASIVE CV LAB;  Service: Cardiovascular;  Laterality: N/A;   LITHOTRIPSY     MASTECTOMY Right 05/02/2018   PACEMAKER IMPLANT N/A 05/13/2023   Procedure: PACEMAKER IMPLANT;  Surgeon: Kennyth Chew, MD;  Location: [facility] INVASIVE CV LAB;  Service: Cardiovascular;  Laterality: N/A;   TEMPORARY PACEMAKER N/A 05/11/2023   Procedure: TEMPORARY PACEMAKER;  Surgeon: Swaz[place], Peter M, MD;  Location: [facility] INVASIVE CV LAB;  Service: Cardiovascular;  Laterality: N/A;   TONSILLECTOMY AND ADENOIDECTOMY     TRANSESOPHAGEAL ECHOCARDIOGRAM (CATH LAB) N/A 12/08/2023   Procedure: TRANSESOPHAGEAL ECHOCARDIOGRAM;  Surgeon: Raford Riggs, MD;  Location: [facility] INVASIVE CV LAB;  Service: Cardiovascular;  Laterality: N/A;   Social History:  reports that he has never smoked. He has never used smokeless tobacco. He reports that he does not drink alcohol and does not use drugs.  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 - How often do you need to have someone help you when you read instructions, pamphlets, or other written material from your doctor or pharmacy?: Never Writes: Yes Employment Status: Retired Marine scientist Issues: Denies Guardian/Conservator: Product manager- wife Training and development officer   Abuse/Neglect Abuse/Neglect Assessment Can Be Completed: Yes Physical Abuse: Denies Verbal Abuse: Denies Sexual Abuse: Denies Exploitation of patient/patient's resources: Denies Self-Neglect: Denies  Patient response to: Social Isolation - How often do you feel lonely or isolated from those around you?: Never  Emotional Status Pt's affect, behavior and adjustment status: Pt in good spirits at time of visit Recent Psychosocial Issues: Denies Psychiatric History: Denies Substance Abuse History: Denies  Patient / Family Perceptions, Expectations & Goals Pt/Family understanding of illness & functional limitations: Pt wife has a general understanding of care needs Premorbid pt/family roles/activities: cog assistance; physically ambulatory Anticipated changes in roles/activities/participation: Assistance with ADLs/IADLs Pt/family expectations/goals: Pt would like for his left leg to work; wife would like for him to be as independent as possible.  Community [employer] Agencies: None Premorbid Home Care/DME Agencies: None Transportation available at discharge:  pt wife Is the patient able to  respond to transportation needs?: Yes In the past 12 months, has lack of transportation kept you from medical appointments or from getting medications?: No In the past 12 months, has lack of transportation kept you from meetings, work, or from getting things needed for daily living?: No Resource referrals recommended: Neuropsychology  Discharge Planning Living Arrangements: Spouse/significant other Support Systems: Spouse/significant other Type of Residence: Private residence Insurance Resources: Media planner (specify) Herbalist Medicare) Surveyor, quantity Resources: Restaurant manager, fast food Screen Referred: No Living Expenses: Banker Management: Spouse Does the patient have any problems obtaining your medications?: No Home Management: Pt wife managed all home care needs Patient/Family Preliminary Plans: No changes Care Coordinator Barriers to Discharge: Decreased caregiver support, Lack of/limited family support, Insurance for SNF coverage Care Coordinator Anticipated Follow Up Needs: HH/OP Expected length of stay: ELOS 10-21 days  Clinical Impression SW met with pt in room while he was sitting in wheelchair, He was in better spirits today and able to complete assessment with SW. DME: several canes, 2-RWs, 3in1 BSC and shower chair.   [name] [name] [name] 01/27/2024, 4:39 PM

## 2024-01-27 NOTE — Progress Notes (Signed)
 Speech Language Pathology Daily Session Note  Patient Details  Name: John [name] MRN: [id number] Date of Birth: Oct 05, 1945  Today's Date: 01/27/2024 SLP Individual Time: 1000-1053 SLP Individual Time Calculation (min): 53 min and Today's Date: 01/27/2024 SLP Missed Time: 7 Minutes Missed Time Reason: Patient unwilling to participate  Short Term Goals: Week 1: SLP Short Term Goal 1 (Week 1): Patient will utilize WRAP memory strategies to recall daily information given maxA SLP Short Term Goal 2 (Week 1): During speech therapy tasks, patient will sustain attention for intervals given maxA SLP Short Term Goal 3 (Week 1): Patient will orient to time using external aids given modA SLP Short Term Goal 4 (Week 1): Patient will state 2 cognitive deficits given maxA  Skilled Therapeutic Interventions:   Pt greeted in his room. He was up in his WC upon SLP arrival. Pt immediately greeted SLP w/ frustration re being up in his chair. Pt clearly confused and did not realize where he is/why he's here. SLP attempted to provide education and orientation information, though pt remained frustrated. SLP provided extensive education re CVA and purpose of CIR. He then begrudgingly participated in a written task targeting visual attention. Pt reported weird vision, but was unable to provide any additional info re this. He required minA for attention to detail and information processing of task directions Dr Emeline came in for her assessment, and he was only able to recall reason for stay with maxA. Remaining orientation information/education provided again via errorless learning to reduce frustration. He benefited from [facility] for problem solving and reasoning during med pass w/ Charity fundraiser. Remains difficult to differentiate cognitive vs behavioral deficits. Unable to continue tx tasks d/t pt frustration/participation. He was left up in his [facility] w/ the alarm set and call light within reach. Recommend cont ST per POC.    Pain  Buttocks pain reported d/t positioning. Unable to provide a number.   Therapy/Group: Individual Therapy  John [name] 01/27/2024, 11:35 AM

## 2024-01-27 NOTE — IPOC Note (Signed)
 Overall Plan of Care [facility]) Patient Details Name: John [name] MRN: [id number] DOB: 1945/05/14  Admitting Diagnosis: Right pontine stroke [facility])  Hospital Problems: Principal Problem:   Right pontine stroke [facility])     Functional Problem List: Nursing Bladder, Bowel, Pain, Endurance, Medication Management, Safety, Skin Integrity, Behavior  PT Balance, Endurance, Motor, Pain, Safety, Skin Integrity, Sensory  OT Balance, Safety, Sensory, Skin Integrity, Endurance, Motor, Vision  SLP Cognition, Safety  TR         Basic ADL's: OT Eating, Grooming, Bathing, Dressing, Toileting     Advanced  ADL's: OT None     Transfers: PT Car, Bed Mobility, Bed to Chair  OT Toilet, Tub/Shower     Locomotion: PT Ambulation, Stairs, Wheelchair Mobility     Additional Impairments: OT Fuctional Use of Upper Extremity  SLP Social Cognition   Problem Solving, Memory, Attention, Awareness  TR      Anticipated Outcomes Item Anticipated Outcome  Self Feeding set up  Swallowing      Basic self-care  SBA  Toileting  SBA   Bathroom Transfers SBA  Bowel/Bladder  manage bowel w mod I assist and bladder w min assist  Transfers  Mod I  Locomotion  Supervision  Communication     Cognition  modA  Pain  Pain < 4 with pr\ns  Safety/Judgment  manage safety w cues   Therapy Plan: PT Intensity: Minimum of 1-2 x/day ,45 to 90 minutes PT Frequency: 5 out of 7 days PT Duration Estimated Length of Stay: 10-14 days OT Intensity: Minimum of 1-2 x/day, 45 to 90 minutes OT Frequency: 5 out of 7 days OT Duration/Estimated Length of Stay: 2-3 weeks SLP Intensity: Minumum of 1-2 x/day, 30 to 90 minutes SLP Frequency: 3 to 5 out of 7 days SLP Duration/Estimated Length of Stay: 2-3 weeks   Team Interventions: Nursing Interventions Patient/Family Education, Disease Management/Prevention, Bladder Management, Bowel Management, Medication Management, Pain Management, Discharge Planning, Skin  Care/Wound Management  PT interventions Ambulation/gait training, Community reintegration, DME/adaptive equipment instruction, Neuromuscular re-education, Psychosocial support, Stair training, UE/LE Strength taining/ROM, Wheelchair propulsion/positioning, UE/LE Coordination activities, Therapeutic Activities, Skin care/wound management, Pain management, Functional electrical stimulation, Discharge planning, Warden/ranger, Cognitive remediation/compensation, Disease management/prevention, Functional mobility training, Patient/family education, Splinting/orthotics, Therapeutic Exercise  OT Interventions Balance/vestibular training, Discharge planning, Functional electrical stimulation, Pain management, Self Care/advanced ADL retraining, Therapeutic Activities, UE/LE Coordination activities, Cognitive remediation/compensation, Disease mangement/prevention, Functional mobility training, Patient/family education, Therapeutic Exercise, Skin care/wound managment, Visual/perceptual remediation/compensation, Firefighter, Fish farm manager, Neuromuscular re-education, Psychosocial support, Splinting/orthotics, UE/LE Strength taining/ROM, Wheelchair propulsion/positioning  SLP Interventions Cognitive remediation/compensation, Environmental controls, Financial trader, Therapeutic Activities, Functional tasks, Internal/external aids, Therapeutic Exercise, Patient/family education  TR Interventions    SW/CM Interventions Discharge Planning, Psychosocial Support, Patient/Family Education   Barriers to Discharge MD  Medical stability, Lack of/limited family support, Insurance for SNF coverage, and Behavior  Nursing Decreased caregiver support, Home environment access/layout, Wound Care, Neurogenic Bowel & Bladder 2 level 4 ste bil rails, bed in living room w spouse  PT None    OT None    SLP      SW Decreased caregiver support, Lack of/limited family support, Community education officer for  SNF coverage     Team Discharge Planning: Destination: PT-Home ,OT- Home , SLP-Home Projected Follow-up: PT-Home health PT, Outpatient PT, OT-  Home health OT, 24 hour supervision/assistance, SLP-24 hour supervision/assistance, Home Health SLP, Outpatient SLP Projected Equipment Needs: PT-To be determined, OT- To be determined, SLP-None recommended by SLP Equipment Details:  PT- , OT-  Patient/family involved in discharge planning: PT- Patient,  OT-Patient, SLP-Patient, Family member/caregiver  MD ELOS: 9-12 days Medical Rehab Prognosis:  Good Assessment: The patient has been admitted for CIR therapies with the diagnosis of Pontine CVA. The team will be addressing functional mobility, strength, stamina, balance, safety, adaptive techniques and equipment, self-care, bowel and bladder mgt, patient and caregiver education,  . Goals have been set at Min A PT/OT/SLP. Anticipated discharge destination is home.       See Team Conference Notes for weekly updates to the plan of care

## 2024-01-27 NOTE — Progress Notes (Signed)
 PROGRESS NOTE   Subjective/Complaints:  Vitals stable, BG a bit better today, still 200s. Complains this a.m. of no one will tell me why I am here.  Working with SLP, patient was able to gradually reason through orientation questions and reason for hospitalization, asked nurse again immediately on entering.  He seems calm, somewhat joking.  Denies any pain, concerns, complaints.  Tone improved today.  ROS: Positives per HPI above. Denies fevers, chills, N/V, abdominal pain, SOB, chest pain, new weakness or paraesthesias.   + Left lower extremity weakness  Objective:   No results found. Recent Labs    01/25/24 0523 01/26/24 0516  WBC 8.1 7.4  HGB 12.4* 12.2*  HCT 36.7* 35.9*  PLT 233 225   Recent Labs    01/25/24 0523 01/26/24 0516  NA 138 135  K 3.9 3.9  CL 102 101  CO2 27 23  GLUCOSE 174* 281*  BUN 24* 20  CREATININE 0.84 0.77  CALCIUM  9.9 9.4    Intake/Output Summary (Last 24 hours) at 01/27/2024 0933 Last data filed at 01/27/2024 0556 Gross per 24 hour  Intake 230 ml  Output 900 ml  Net -670 ml        Physical Exam: Vital Signs Blood pressure 129/77, pulse 67, temperature 98.8 F (37.1 C), temperature source Oral, resp. rate 17, height 6' (1.829 m), weight 80 kg, SpO2 99%.  Constitutional: No apparent distress. Appropriate appearance for age.  Sitting up in bedside chair with OT. HENT: No JVD. Neck Supple. Trachea midline. Atraumatic, normocephalic. Cardiovascular: RRR, no murmurs/rub/gallops. No Edema. Peripheral pulses 2+  Respiratory: CTAB. No rales, rhonchi, or wheezing. On RA.  Abdomen: + bowel sounds, normoactive. No distention  GU: Not examined. +Foley, draining clear urine.  Skin: C/D/I. No apparent lesions.  MSK:      No apparent deformity.        Left hamstring tightness much improved, can now range to neutral.  Neurologic exam:  Awake, alert, oriented x 3. Light touch intact in  all 4 extremities Strength: Right upper and lower extremity 5 out of 5.  Left upper extremity 3/4 SA, otherwise 5- out of 5.   Left lower extremity 3/5 HF, 3/5 KE, 4/5 DF and PF  Heavy posterior lean with head forward flexed, needs continuous cueing to sit upright  + Left  lower extremity motor apraxia + Left upper and lower extremity ataxia Negative Hoffmann's, negative Babinski Tone: Hamstring tone as above, no apparent spasticity--significantly improved-17       Assessment/Plan: 1. Functional deficits which require 3+ hours per day of interdisciplinary therapy in a comprehensive inpatient rehab setting. Physiatrist is providing close team supervision and 24 hour management of active medical problems listed below. Physiatrist and rehab team continue to assess barriers to discharge/monitor patient progress toward functional and medical goals  Care Tool:  Bathing              Bathing assist Assist Level: Maximal Assistance - Patient 24 - 49%     Upper Body Dressing/Undressing Upper body dressing        Upper body assist Assist Level: Maximal Assistance - Patient 25 - 49%    Lower Body  Dressing/Undressing Lower body dressing            Lower body assist Assist for lower body dressing: Dependent - Patient 0%     Toileting Toileting    Toileting assist Assist for toileting: 2 Helpers     Transfers Chair/bed transfer  Transfers assist  Chair/bed transfer activity did not occur: Safety/medical concerns  Chair/bed transfer assist level: 2 Helpers     Locomotion Ambulation   Ambulation assist   Ambulation activity did not occur: Safety/medical concerns (unsafe due to weakness)          Walk 10 feet activity   Assist  Walk 10 feet activity did not occur: Safety/medical concerns (unsafe due to weakness)        Walk 50 feet activity   Assist Walk 50 feet with 2 turns activity did not occur: Safety/medical concerns (unsafe due to  weakness)         Walk 150 feet activity   Assist Walk 150 feet activity did not occur: Safety/medical concerns (unsafe due to weakness)         Walk 10 feet on uneven surface  activity   Assist Walk 10 feet on uneven surfaces activity did not occur: Safety/medical concerns (unsafe due to weakness)         Wheelchair     Assist Is the patient using a wheelchair?: Yes Type of Wheelchair: Manual Wheelchair activity did not occur: Safety/medical concerns (unsafe due to weakness)         Wheelchair 50 feet with 2 turns activity    Assist    Wheelchair 50 feet with 2 turns activity did not occur: Safety/medical concerns (unsafe due to weakness)       Wheelchair 150 feet activity     Assist  Wheelchair 150 feet activity did not occur: Safety/medical concerns (unsafe due to weakness)       Blood pressure 129/77, pulse 67, temperature 98.8 F (37.1 C), temperature source Oral, resp. rate 17, height 6' (1.829 m), weight 80 kg, SpO2 99%.  Medical Problem List and Plan: 1. Functional deficits secondary to acute pontine infarct             -patient may  shower             -ELOS/Goals: 9-12 days Min A to supervision             -Stable to continue CIR  2.  Antithrombotics: -DVT/anticoagulation:  Pharmaceutical: Lovenox              -antiplatelet therapy: Asprin and Plavix  x 3 weeks then Aspirin  alone (ends 10/28).  3. Pain Management: Tylenol  as needed  4. Mood/Behavior/Sleep: LCSW to follow for evaluation and support when available.              -antipsychotic agents:  Lexapro  ---was on Lexapro  10 mg PTA, wife believes SNF was withholding medications and requests he resume use.    - 10-15: Difficult to arouse, somnolent with intermittently emotionally labile throughout the day.  Continue Lexapro  as above.  Start adjunctive modafinil 100 mg for 3 days, then increase to 200 mg daily.  Starting sleep log.  - 10-16: Sleep log appropriate, much improved  today.  Pleasant, appropriate.  - 10-17: Continues to do well, but with severe cognitive and attention deficits.  Switch Provigil to Ritalin 5 mg twice daily starting tomorrow.  5. Neuropsych/cognition: This patient is not fully capable of making decisions on his own behalf.   - 10-15: Starting  Provigil as above--significantly improved, continue  6. Skin/Wound Care: Routine pressure relief measures             -Stage 2 Sacral wound--wound care orders placed--Gerhardt's butt cream +Zinc  PO    7. Fluids/Electrolytes/Nutrition: Monitor intake and output.  CBC/CMET in a.m.             - SLP consult             -Cardiac diet +Ensure, Zinc  and vitamin C    - 10-15: Albumin 2.8.  Poor p.o. intakes.  Monitor, dietary to assess  - 10-16: Ate 100% of breakfast; cognitively much better today.  Monitor.  8.  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  Atorvastatin  80 mg daily.   9.  CAD/HLD: Atorvastatin    9.  Acute cystitis: Urine culture ESBL E. Coli---Treated with meropenem then 1 dose of fosfomycin.   10.  Hx urinary retention/BPH: Foley catheter present--Chronic since 07/2023   - Continue with Foley for now  11.  HTN: Resumed losartan  25 mg daily    - Monitor vitals with initiation of therapies    01/27/2024    5:56 AM 01/26/2024    9:50 PM 01/26/2024    2:20 PM  Vitals with BMI  Systolic 129 139 890  Diastolic 77 71 69  Pulse 67 83 83     12.  T2DM: A1c 7.5% continue CBGs ac/hs with SSI.  On chart review from 11-2023, Home regimen was metformin  500 mg twice daily, Lantus  10 units every morning/15 units every afternoon.             - Lantus  increased to 5 units twice daily--monitor for hypoglycemia Recent Labs    01/26/24 1631 01/26/24 2150 01/27/24 0602  GLUCAP 233* 197* 206*      - 10-15: Blood sugars very elevated today.  Increase Lantus  to 10 units twice daily.  Resume metformin  500 mg twice  daily--monitor   - 10/17: increase lantus  to home 10 U QAM / 15 U QPM  13.  Heart block: S/p PPM   14.  GERD: Continue PPI    15. Constipation?- Pt didn't remember LBM- was earlier this AM- medium type 6- incontinent   - Last bowel movement 10-14, medium.  16.  Left lower extremity hamstring tightness.  Not velocity dependent, but very limiting for coordination.  - 10/16: Start baclofen 5 mg 3 times daily --10/17 left lower extremity tone much improved with this, but may be contributing to confusion.  Will move medication to as needed  LOS: 3 days A FACE TO FACE EVALUATION WAS PERFORMED  Joesph JAYSON Likes 01/27/2024, 9:33 AM

## 2024-01-28 ENCOUNTER — Inpatient Hospital Stay (HOSPITAL_COMMUNITY)

## 2024-01-28 ENCOUNTER — Encounter (HOSPITAL_COMMUNITY): Payer: Self-pay

## 2024-01-28 ENCOUNTER — Encounter (HOSPITAL_COMMUNITY): Payer: Self-pay | Admitting: Internal Medicine

## 2024-01-28 ENCOUNTER — Inpatient Hospital Stay (HOSPITAL_COMMUNITY)
Admission: RE | Admit: 2024-01-28 | Discharge: 2024-02-03 | DRG: 871 | Disposition: A | Discharge status: Rehabilitation Facility | Source: Intra-hospital | Attending: Internal Medicine | Admitting: Internal Medicine

## 2024-01-28 DIAGNOSIS — N400 Enlarged prostate without lower urinary tract symptoms: Secondary | ICD-10-CM | POA: Diagnosis present

## 2024-01-28 DIAGNOSIS — I10 Essential (primary) hypertension: Secondary | ICD-10-CM | POA: Diagnosis not present

## 2024-01-28 DIAGNOSIS — R7989 Other specified abnormal findings of blood chemistry: Secondary | ICD-10-CM | POA: Diagnosis present

## 2024-01-28 DIAGNOSIS — I6782 Cerebral ischemia: Secondary | ICD-10-CM | POA: Diagnosis not present

## 2024-01-28 DIAGNOSIS — Z79899 Other long term (current) drug therapy: Secondary | ICD-10-CM

## 2024-01-28 DIAGNOSIS — E876 Hypokalemia: Secondary | ICD-10-CM | POA: Diagnosis not present

## 2024-01-28 DIAGNOSIS — Z807 Family history of other malignant neoplasms of lymphoid, hematopoietic and related tissues: Secondary | ICD-10-CM

## 2024-01-28 DIAGNOSIS — Z66 Do not resuscitate: Secondary | ICD-10-CM | POA: Diagnosis not present

## 2024-01-28 DIAGNOSIS — I2510 Atherosclerotic heart disease of native coronary artery without angina pectoris: Secondary | ICD-10-CM | POA: Diagnosis present

## 2024-01-28 DIAGNOSIS — I5022 Chronic systolic (congestive) heart failure: Secondary | ICD-10-CM | POA: Diagnosis present

## 2024-01-28 DIAGNOSIS — E1165 Type 2 diabetes mellitus with hyperglycemia: Secondary | ICD-10-CM | POA: Diagnosis not present

## 2024-01-28 DIAGNOSIS — E7800 Pure hypercholesterolemia, unspecified: Secondary | ICD-10-CM | POA: Diagnosis not present

## 2024-01-28 DIAGNOSIS — Z823 Family history of stroke: Secondary | ICD-10-CM

## 2024-01-28 DIAGNOSIS — R6520 Severe sepsis without septic shock: Secondary | ICD-10-CM | POA: Diagnosis not present

## 2024-01-28 DIAGNOSIS — Z7901 Long term (current) use of anticoagulants: Secondary | ICD-10-CM | POA: Diagnosis not present

## 2024-01-28 DIAGNOSIS — R404 Transient alteration of awareness: Secondary | ICD-10-CM | POA: Diagnosis not present

## 2024-01-28 DIAGNOSIS — K828 Other specified diseases of gallbladder: Secondary | ICD-10-CM | POA: Diagnosis not present

## 2024-01-28 DIAGNOSIS — I5020 Unspecified systolic (congestive) heart failure: Secondary | ICD-10-CM | POA: Diagnosis present

## 2024-01-28 DIAGNOSIS — Z9861 Coronary angioplasty status: Secondary | ICD-10-CM | POA: Diagnosis not present

## 2024-01-28 DIAGNOSIS — G9340 Encephalopathy, unspecified: Secondary | ICD-10-CM

## 2024-01-28 DIAGNOSIS — T83518A Infection and inflammatory reaction due to other urinary catheter, initial encounter: Secondary | ICD-10-CM | POA: Diagnosis not present

## 2024-01-28 DIAGNOSIS — G8929 Other chronic pain: Secondary | ICD-10-CM | POA: Diagnosis not present

## 2024-01-28 DIAGNOSIS — Z7982 Long term (current) use of aspirin: Secondary | ICD-10-CM

## 2024-01-28 DIAGNOSIS — Z86718 Personal history of other venous thrombosis and embolism: Secondary | ICD-10-CM

## 2024-01-28 DIAGNOSIS — Z950 Presence of cardiac pacemaker: Secondary | ICD-10-CM

## 2024-01-28 DIAGNOSIS — K592 Neurogenic bowel, not elsewhere classified: Secondary | ICD-10-CM | POA: Diagnosis not present

## 2024-01-28 DIAGNOSIS — N390 Urinary tract infection, site not specified: Secondary | ICD-10-CM | POA: Diagnosis not present

## 2024-01-28 DIAGNOSIS — K8020 Calculus of gallbladder without cholecystitis without obstruction: Secondary | ICD-10-CM | POA: Diagnosis not present

## 2024-01-28 DIAGNOSIS — Z794 Long term (current) use of insulin: Secondary | ICD-10-CM

## 2024-01-28 DIAGNOSIS — E1140 Type 2 diabetes mellitus with diabetic neuropathy, unspecified: Secondary | ICD-10-CM | POA: Diagnosis not present

## 2024-01-28 DIAGNOSIS — Z7984 Long term (current) use of oral hypoglycemic drugs: Secondary | ICD-10-CM

## 2024-01-28 DIAGNOSIS — A419 Sepsis, unspecified organism: Principal | ICD-10-CM | POA: Diagnosis present

## 2024-01-28 DIAGNOSIS — F32A Depression, unspecified: Secondary | ICD-10-CM | POA: Diagnosis not present

## 2024-01-28 DIAGNOSIS — D649 Anemia, unspecified: Secondary | ICD-10-CM | POA: Diagnosis present

## 2024-01-28 DIAGNOSIS — K219 Gastro-esophageal reflux disease without esophagitis: Secondary | ICD-10-CM | POA: Diagnosis not present

## 2024-01-28 DIAGNOSIS — R569 Unspecified convulsions: Secondary | ICD-10-CM

## 2024-01-28 DIAGNOSIS — E1142 Type 2 diabetes mellitus with diabetic polyneuropathy: Secondary | ICD-10-CM | POA: Diagnosis present

## 2024-01-28 DIAGNOSIS — K7460 Unspecified cirrhosis of liver: Secondary | ICD-10-CM | POA: Diagnosis not present

## 2024-01-28 DIAGNOSIS — G9341 Metabolic encephalopathy: Secondary | ICD-10-CM | POA: Diagnosis not present

## 2024-01-28 DIAGNOSIS — R0602 Shortness of breath: Secondary | ICD-10-CM | POA: Diagnosis not present

## 2024-01-28 DIAGNOSIS — R4189 Other symptoms and signs involving cognitive functions and awareness: Secondary | ICD-10-CM | POA: Diagnosis present

## 2024-01-28 DIAGNOSIS — F419 Anxiety disorder, unspecified: Secondary | ICD-10-CM | POA: Diagnosis present

## 2024-01-28 DIAGNOSIS — M4854XA Collapsed vertebra, not elsewhere classified, thoracic region, initial encounter for fracture: Secondary | ICD-10-CM | POA: Diagnosis not present

## 2024-01-28 DIAGNOSIS — I110 Hypertensive heart disease with heart failure: Secondary | ICD-10-CM | POA: Diagnosis present

## 2024-01-28 DIAGNOSIS — J690 Pneumonitis due to inhalation of food and vomit: Secondary | ICD-10-CM | POA: Diagnosis not present

## 2024-01-28 DIAGNOSIS — E872 Acidosis, unspecified: Secondary | ICD-10-CM | POA: Diagnosis present

## 2024-01-28 DIAGNOSIS — N320 Bladder-neck obstruction: Secondary | ICD-10-CM | POA: Diagnosis not present

## 2024-01-28 DIAGNOSIS — Z9221 Personal history of antineoplastic chemotherapy: Secondary | ICD-10-CM

## 2024-01-28 DIAGNOSIS — Z833 Family history of diabetes mellitus: Secondary | ICD-10-CM

## 2024-01-28 DIAGNOSIS — I69354 Hemiplegia and hemiparesis following cerebral infarction affecting left non-dominant side: Secondary | ICD-10-CM | POA: Diagnosis not present

## 2024-01-28 DIAGNOSIS — R4182 Altered mental status, unspecified: Secondary | ICD-10-CM

## 2024-01-28 DIAGNOSIS — I639 Cerebral infarction, unspecified: Secondary | ICD-10-CM | POA: Diagnosis not present

## 2024-01-28 DIAGNOSIS — K5900 Constipation, unspecified: Secondary | ICD-10-CM | POA: Diagnosis not present

## 2024-01-28 DIAGNOSIS — I6350 Cerebral infarction due to unspecified occlusion or stenosis of unspecified cerebral artery: Secondary | ICD-10-CM | POA: Diagnosis not present

## 2024-01-28 DIAGNOSIS — G4700 Insomnia, unspecified: Secondary | ICD-10-CM | POA: Diagnosis not present

## 2024-01-28 DIAGNOSIS — I69319 Unspecified symptoms and signs involving cognitive functions following cerebral infarction: Secondary | ICD-10-CM | POA: Diagnosis not present

## 2024-01-28 DIAGNOSIS — Z515 Encounter for palliative care: Secondary | ICD-10-CM

## 2024-01-28 DIAGNOSIS — I4510 Unspecified right bundle-branch block: Secondary | ICD-10-CM | POA: Diagnosis present

## 2024-01-28 DIAGNOSIS — R29818 Other symptoms and signs involving the nervous system: Secondary | ICD-10-CM | POA: Diagnosis not present

## 2024-01-28 DIAGNOSIS — R0989 Other specified symptoms and signs involving the circulatory and respiratory systems: Secondary | ICD-10-CM | POA: Diagnosis not present

## 2024-01-28 DIAGNOSIS — I959 Hypotension, unspecified: Secondary | ICD-10-CM | POA: Diagnosis present

## 2024-01-28 DIAGNOSIS — Z8673 Personal history of transient ischemic attack (TIA), and cerebral infarction without residual deficits: Secondary | ICD-10-CM

## 2024-01-28 DIAGNOSIS — A499 Bacterial infection, unspecified: Secondary | ICD-10-CM | POA: Diagnosis present

## 2024-01-28 DIAGNOSIS — N179 Acute kidney failure, unspecified: Secondary | ICD-10-CM | POA: Diagnosis not present

## 2024-01-28 DIAGNOSIS — Z91048 Other nonmedicinal substance allergy status: Secondary | ICD-10-CM

## 2024-01-28 DIAGNOSIS — I69391 Dysphagia following cerebral infarction: Secondary | ICD-10-CM | POA: Diagnosis not present

## 2024-01-28 DIAGNOSIS — I951 Orthostatic hypotension: Secondary | ICD-10-CM | POA: Diagnosis not present

## 2024-01-28 DIAGNOSIS — R627 Adult failure to thrive: Secondary | ICD-10-CM | POA: Diagnosis not present

## 2024-01-28 DIAGNOSIS — Z7902 Long term (current) use of antithrombotics/antiplatelets: Secondary | ICD-10-CM

## 2024-01-28 DIAGNOSIS — Z853 Personal history of malignant neoplasm of breast: Secondary | ICD-10-CM

## 2024-01-28 LAB — URINALYSIS, ROUTINE W REFLEX MICROSCOPIC
Bilirubin Urine: NEGATIVE
Glucose, UA: NEGATIVE mg/dL
Hgb urine dipstick: NEGATIVE
Ketones, ur: NEGATIVE mg/dL
Nitrite: NEGATIVE
Protein, ur: 30 mg/dL — AB
Specific Gravity, Urine: 1.02 (ref 1.005–1.030)
WBC, UA: >50 WBC/hpf (ref 0–5)
pH: 5 (ref 5.0–8.0)

## 2024-01-28 LAB — BASIC METABOLIC PANEL WITH GFR
Anion gap: 20 — ABNORMAL HIGH (ref 5–15)
BUN: 32 mg/dL — ABNORMAL HIGH (ref 8–23)
CO2: 15 mmol/L — ABNORMAL LOW (ref 22–32)
Calcium: 9.7 mg/dL (ref 8.9–10.3)
Chloride: 102 mmol/L (ref 98–111)
Creatinine, Ser: 1.19 mg/dL (ref 0.61–1.24)
GFR, Estimated: >60 mL/min (ref >60)
Glucose, Bld: 154 mg/dL — ABNORMAL HIGH (ref 70–99)
Potassium: 4.1 mmol/L (ref 3.5–5.1)
Sodium: 137 mmol/L (ref 135–145)

## 2024-01-28 LAB — CBC
HCT: 39.1 % (ref 39.0–52.0)
Hemoglobin: 12.6 g/dL — ABNORMAL LOW (ref 13.0–17.0)
MCH: 29.2 pg (ref 26.0–34.0)
MCHC: 32.2 g/dL (ref 30.0–36.0)
MCV: 90.5 fL (ref 80.0–100.0)
Platelets: 304 K/uL (ref 150–400)
RBC: 4.32 MIL/uL (ref 4.22–5.81)
RDW: 17.6 % — ABNORMAL HIGH (ref 11.5–15.5)
WBC: 12.4 K/uL — ABNORMAL HIGH (ref 4.0–10.5)
nRBC: 0 % (ref 0.0–0.2)

## 2024-01-28 LAB — APTT: aPTT: 25 s (ref 24–36)

## 2024-01-28 LAB — PROTIME-INR
INR: 1 (ref 0.8–1.2)
Prothrombin Time: 13.6 s (ref 11.4–15.2)

## 2024-01-28 LAB — LACTIC ACID, PLASMA: Lactic Acid, Venous: 3.6 mmol/L (ref 0.5–1.9)

## 2024-01-28 LAB — CK TOTAL AND CKMB (NOT AT ARMC)
CK, MB: 3.1 ng/mL (ref 0.5–5.0)
Total CK: 44 U/L — ABNORMAL LOW (ref 49–397)

## 2024-01-28 LAB — TROPONIN I (HIGH SENSITIVITY)
Troponin I (High Sensitivity): 14 ng/L (ref <18)
Troponin I (High Sensitivity): 16 ng/L (ref <18)

## 2024-01-28 LAB — GLUCOSE, CAPILLARY
Glucose-Capillary: 129 mg/dL — ABNORMAL HIGH (ref 70–99)
Glucose-Capillary: 147 mg/dL — ABNORMAL HIGH (ref 70–99)
Glucose-Capillary: 168 mg/dL — ABNORMAL HIGH (ref 70–99)
Glucose-Capillary: 155 mg/dL — ABNORMAL HIGH (ref 70–99)
Glucose-Capillary: 111 mg/dL — ABNORMAL HIGH (ref 70–99)

## 2024-01-28 MED ORDER — INSULIN ASPART 100 UNIT/ML IJ SOLN
0.0000 [IU] | Freq: Three times a day (TID) | INTRAMUSCULAR | Status: DC
  Administered 2024-01-30: 1 [IU] via SUBCUTANEOUS
  Administered 2024-01-30: 3 [IU] via SUBCUTANEOUS

## 2024-01-28 MED ORDER — VANCOMYCIN HCL 1750 MG/350ML IV SOLN
1750.0000 mg | Freq: Once | INTRAVENOUS | Status: AC
  Administered 2024-01-28: 1750 mg via INTRAVENOUS
  Filled 2024-01-28: qty 350

## 2024-01-28 MED ORDER — INSULIN GLARGINE-YFGN 100 UNIT/ML ~~LOC~~ SOLN
5.0000 [IU] | Freq: Two times a day (BID) | SUBCUTANEOUS | Status: DC
  Administered 2024-01-29: 5 [IU] via SUBCUTANEOUS
  Filled 2024-01-28 (×3): qty 0.05

## 2024-01-28 MED ORDER — SODIUM CHLORIDE 0.9 % IV SOLN: INTRAVENOUS | Status: DC

## 2024-01-28 MED ORDER — CLOPIDOGREL BISULFATE 75 MG PO TABS
75.0000 mg | ORAL_TABLET | Freq: Every day | ORAL | Status: DC
  Administered 2024-01-31 – 2024-02-03 (×4): 75 mg via ORAL
  Filled 2024-01-28 (×6): qty 1

## 2024-01-28 MED ORDER — SODIUM CHLORIDE 0.9 % IV SOLN
1.0000 g | Freq: Three times a day (TID) | INTRAVENOUS | Status: DC
  Administered 2024-01-28 – 2024-01-30 (×5): 1 g via INTRAVENOUS
  Filled 2024-01-28 (×7): qty 20

## 2024-01-28 MED ORDER — ESCITALOPRAM OXALATE 10 MG PO TABS
10.0000 mg | ORAL_TABLET | Freq: Every day | ORAL | Status: DC
  Administered 2024-01-29 – 2024-02-03 (×6): 10 mg via ORAL
  Filled 2024-01-28 (×6): qty 1

## 2024-01-28 MED ORDER — VANCOMYCIN HCL 1500 MG/300ML IV SOLN
1500.0000 mg | INTRAVENOUS | Status: DC
Start: 2024-01-29 — End: 2024-01-29

## 2024-01-28 MED ORDER — SODIUM CHLORIDE 0.9 % IV SOLN
1.0000 g | INTRAVENOUS | Status: DC
  Administered 2024-01-28: 1 g via INTRAVENOUS
  Filled 2024-01-28: qty 10

## 2024-01-28 MED ORDER — SENNOSIDES-DOCUSATE SODIUM 8.6-50 MG PO TABS: 1.0000 | ORAL_TABLET | Freq: Every evening | ORAL | Status: DC | PRN

## 2024-01-28 MED ORDER — ACETAMINOPHEN 650 MG RE SUPP
650.0000 mg | Freq: Four times a day (QID) | RECTAL | Status: DC | PRN
  Administered 2024-01-28: 650 mg via RECTAL
  Filled 2024-01-28: qty 1

## 2024-01-28 MED ORDER — OXYCODONE HCL 5 MG PO TABS: 2.5000 mg | ORAL_TABLET | ORAL | Status: DC | PRN

## 2024-01-28 MED ORDER — ACETAMINOPHEN 325 MG PO TABS
650.0000 mg | ORAL_TABLET | Freq: Four times a day (QID) | ORAL | Status: DC | PRN
Start: 2024-01-28 — End: 2024-02-03

## 2024-01-28 MED ORDER — INSULIN ASPART 100 UNIT/ML IJ SOLN
0.0000 [IU] | Freq: Every day | INTRAMUSCULAR | Status: DC
  Administered 2024-01-31: 4 [IU] via SUBCUTANEOUS
  Administered 2024-02-01 – 2024-02-02 (×2): 2 [IU] via SUBCUTANEOUS

## 2024-01-28 MED ORDER — CHLORHEXIDINE GLUCONATE CLOTH 2 % EX PADS
6.0000 | MEDICATED_PAD | Freq: Every day | CUTANEOUS | Status: DC
  Administered 2024-01-29 – 2024-02-03 (×6): 6 via TOPICAL

## 2024-01-28 MED ORDER — PANTOPRAZOLE SODIUM 40 MG PO TBEC
40.0000 mg | DELAYED_RELEASE_TABLET | Freq: Every day | ORAL | Status: DC
Start: 2024-01-28 — End: 2024-02-03
  Administered 2024-01-29 – 2024-02-02 (×5): 40 mg via ORAL
  Filled 2024-01-28 (×6): qty 1

## 2024-01-28 MED ORDER — ENOXAPARIN SODIUM 40 MG/0.4ML IJ SOSY
40.0000 mg | PREFILLED_SYRINGE | INTRAMUSCULAR | Status: DC
  Administered 2024-01-29 – 2024-02-03 (×6): 40 mg via SUBCUTANEOUS
  Filled 2024-01-28 (×6): qty 0.4

## 2024-01-28 MED ORDER — SODIUM CHLORIDE 0.9% FLUSH
3.0000 mL | Freq: Two times a day (BID) | INTRAVENOUS | Status: DC
  Administered 2024-01-28 – 2024-02-03 (×12): 3 mL via INTRAVENOUS

## 2024-01-28 MED ORDER — SODIUM CHLORIDE 0.9 % IV SOLN: INTRAVENOUS | Status: AC

## 2024-01-28 MED ORDER — PROCHLORPERAZINE EDISYLATE 10 MG/2ML IJ SOLN
10.0000 mg | INTRAMUSCULAR | Status: DC | PRN
  Administered 2024-01-28: 10 mg via INTRAVENOUS
  Filled 2024-01-28: qty 2

## 2024-01-28 MED ORDER — ASPIRIN 81 MG PO TBEC
81.0000 mg | DELAYED_RELEASE_TABLET | Freq: Every day | ORAL | Status: DC
Start: 2024-01-29 — End: 2024-02-03
  Administered 2024-01-29 – 2024-02-03 (×6): 81 mg via ORAL
  Filled 2024-01-28 (×6): qty 1

## 2024-01-28 MED ORDER — FENTANYL CITRATE (PF) 50 MCG/ML IJ SOSY
12.5000 ug | PREFILLED_SYRINGE | INTRAMUSCULAR | Status: DC | PRN
Start: 2024-01-28 — End: 2024-01-30
  Administered 2024-01-28: 25 ug via INTRAVENOUS
  Filled 2024-01-28: qty 1

## 2024-01-28 MED ORDER — ATORVASTATIN CALCIUM 80 MG PO TABS
80.0000 mg | ORAL_TABLET | Freq: Every day | ORAL | Status: DC
  Administered 2024-01-29 – 2024-02-02 (×5): 80 mg via ORAL
  Filled 2024-01-28 (×6): qty 1

## 2024-01-28 MED ORDER — GUAIFENESIN 100 MG/5ML PO LIQD
5.0000 mL | ORAL | Status: DC | PRN
  Filled 2024-01-28: qty 10

## 2024-01-28 NOTE — Progress Notes (Signed)
 Physical Therapy Session Note  Patient Details  Name: John [name] MRN: [id number] Date of Birth: 05/18/1945  Today's Date: 01/28/2024  Short Term Goals: Week 1:  PT Short Term Goal 1 (Week 1): Pt will perform supine<->sit with Min A PT Short Term Goal 2 (Week 1): Pt will amb x25' using RW, CGA PT Short Term Goal 3 (Week 1): pt will transfer sit<->stand with SBA following proper sequencing 80% of time  Pt missed 45 min of skilled therapy due to medical presentation. Pt sitting in recliner with nsg present attempting to arouse pt. Pt unresponsive to tight squeeze of upper trap or sternal rub. Pt with low systolic in 50's and dependent + 2 transfer from TIS<EOB and dependently transitioned to supine and scooted to [facility] via chuck + 3. RR called by nsg (covering physician arrived during event), followed by code stroke with stroke team arriving shortly after. Will re-attempt as schedule and pt availability permits.     Therapy Documentation Precautions:  Precautions Precautions: Fall Recall of Precautions/Restrictions: Impaired Precaution/Restrictions Comments: L hemiparesis Restrictions Weight Bearing Restrictions Per Provider Order: Yes Other Position/Activity Restrictions: L sided weakness   Therapy/Group: Individual Therapy  Bekah Igoe PTA 01/28/2024, 12:49 PM

## 2024-01-28 NOTE — Procedures (Signed)
 Patient Name: John [name]  MRN: [id number]  Epilepsy Attending: Arlin MALVA Krebs  Referring Physician/Provider: Sallyann Normie HERO, MD  Date: 01/28/2024 Duration: 22.29 mins  Patient history: 78yo M with ams. EEG to evaluate for seizure  Level of alertness: Awake  AEDs during EEG study: None  Technical aspects: This EEG study was done with scalp electrodes positioned according to the 10-20 International system of electrode placement. Electrical activity was reviewed with band pass filter of 1-70Hz , sensitivity of 7 uV/mm, display speed of 32mm/sec with a 60Hz  notched filter applied as appropriate. EEG data were recorded continuously and digitally stored.  Video monitoring was available and reviewed as appropriate.  Description:  EEG showed continuous generalized 3 to 6 Hz theta-delta slowing. Hyperventilation and photic stimulation were not performed.     ABNORMALITY - Continuous slow, generalized  IMPRESSION: This study is suggestive of generalized cerebral dysfunction (encephalopathy). No seizures or epileptiform discharges were seen throughout the recording.  Eartha Vonbehren O Gennette Shadix

## 2024-01-28 NOTE — Significant Event (Signed)
 Rapid Response Event Note   Reason for Call :  Unresponsive and hypotensive  56/44  While he was in the chair  Initial Focused Assessment:  Upon my arrival patient is lying in bed he is unresponsive to deep painful stim.  He has minimal gag reflex.  Lung sounds decreased bases.  Heart tones regular.  He is clammy.  BP 116/60  HR 73  O2 sat 100% on RA  RR 16 CBG 169  Interventions:  NS bolus started Code Stroke called:  see additional notes Lab at bedside to draw labs  Reassessment BP improved to 120/72  HR 93 He is moving spontaneously more but is still not interacting with staff.  Plan of Care:  IVF Hospitalist consult   Event Summary:   MD Notified: Dr Babs and Dr Sallyann came to bedside Call Time: 1313 Arrival Time: 1316 End Time: 1425  Elvin [place], RN

## 2024-01-28 NOTE — Progress Notes (Signed)
 PROGRESS NOTE   Subjective/Complaints:  Pt sleeping when I came in. Indicates he slept ok. Doesn't remember me from the other day  ROS: Limited due to cognitive/behavioral   Objective:   No results found. Recent Labs    01/26/24 0516  WBC 7.4  HGB 12.2*  HCT 35.9*  PLT 225   Recent Labs    01/26/24 0516  NA 135  K 3.9  CL 101  CO2 23  GLUCOSE 281*  BUN 20  CREATININE 0.77  CALCIUM  9.4    Intake/Output Summary (Last 24 hours) at 01/28/2024 1044 Last data filed at 01/28/2024 0845 Gross per 24 hour  Intake 360 ml  Output 500 ml  Net -140 ml        Physical Exam: Vital Signs Blood pressure 129/60, pulse 69, temperature 97.8 F (36.6 C), temperature source Oral, resp. rate 16, height 6' (1.829 m), weight 80 kg, SpO2 97%.  Constitutional: No distress . Vital signs reviewed. HEENT: NCAT, EOMI, oral membranes moist Neck: supple Cardiovascular: RRR without murmur. No JVD    Respiratory/Chest: CTA Bilaterally without wheezes or rales. Normal effort    GI/Abdomen: BS +, non-tender, non-distended Ext: no clubbing, cyanosis, or edema Psych: pleasant and cooperative  GU: Not examined. +Foley, draining clear yellow urine.  Skin:  sacral wound  MSK:      No apparent deformity.        Left hamstring tightness much improved, can now range to neutral.  Neurologic exam:  Awake, alert, oriented x 3. Light touch intact in all 4 extremities Strength: Right upper and lower extremity 5 out of 5.  Left upper extremity 3/4 SA, otherwise 5- out of 5.   Left lower extremity 3/5 HF, 3/5 KE, 4/5 DF and PF  Leans to left in bed  + Left  lower extremity motor apraxia + Left upper and lower extremity ataxia Negative Hoffmann's, negative Babinski Tone: Hamstring tone as above, no apparent spasticity--improved 10/18       Assessment/Plan: 1. Functional deficits which require 3+ hours per day of interdisciplinary  therapy in a comprehensive inpatient rehab setting. Physiatrist is providing close team supervision and 24 hour management of active medical problems listed below. Physiatrist and rehab team continue to assess barriers to discharge/monitor patient progress toward functional and medical goals  Care Tool:  Bathing              Bathing assist Assist Level: Maximal Assistance - Patient 24 - 49%     Upper Body Dressing/Undressing Upper body dressing        Upper body assist Assist Level: Maximal Assistance - Patient 25 - 49%    Lower Body Dressing/Undressing Lower body dressing            Lower body assist Assist for lower body dressing: Dependent - Patient 0%     Toileting Toileting    Toileting assist Assist for toileting: 2 Helpers     Transfers Chair/bed transfer  Transfers assist  Chair/bed transfer activity did not occur: Safety/medical concerns  Chair/bed transfer assist level: Dependent - Patient 0%     Locomotion Ambulation   Ambulation assist   Ambulation activity did not occur:  Safety/medical concerns (unsafe due to weakness)  Assist level: 2 helpers Assistive device: Lite Gait Max distance: 58ft   Walk 10 feet activity   Assist  Walk 10 feet activity did not occur: Safety/medical concerns (unsafe due to weakness)  Assist level: 2 helpers Assistive device: Lite Gait   Walk 50 feet activity   Assist Walk 50 feet with 2 turns activity did not occur: Safety/medical concerns (unsafe due to weakness)         Walk 150 feet activity   Assist Walk 150 feet activity did not occur: Safety/medical concerns (unsafe due to weakness)         Walk 10 feet on uneven surface  activity   Assist Walk 10 feet on uneven surfaces activity did not occur: Safety/medical concerns (unsafe due to weakness)         Wheelchair     Assist Is the patient using a wheelchair?: Yes Type of Wheelchair: Manual Wheelchair activity did not occur:  Safety/medical concerns (unsafe due to weakness)         Wheelchair 50 feet with 2 turns activity    Assist    Wheelchair 50 feet with 2 turns activity did not occur: Safety/medical concerns (unsafe due to weakness)       Wheelchair 150 feet activity     Assist  Wheelchair 150 feet activity did not occur: Safety/medical concerns (unsafe due to weakness)       Blood pressure 129/60, pulse 69, temperature 97.8 F (36.6 C), temperature source Oral, resp. rate 16, height 6' (1.829 m), weight 80 kg, SpO2 97%.  Medical Problem List and Plan: 1. Functional deficits secondary to acute pontine infarct             -patient may  shower             -ELOS/Goals: 9-12 days Min A to supervision           -Continue CIR therapies including PT, OT, and SLP   2.  Antithrombotics: -DVT/anticoagulation:  Pharmaceutical: Lovenox              -antiplatelet therapy: Asprin and Plavix  x 3 weeks then Aspirin  alone (ends 10/28).  3. Pain Management: Tylenol  as needed  4. Mood/Behavior/Sleep: LCSW to follow for evaluation and support when available.              -antipsychotic agents:  Lexapro  ---was on Lexapro  10 mg PTA, wife believes SNF was withholding medications and requests he resume use.    - 10-15: Difficult to arouse, somnolent with intermittently emotionally labile throughout the day.  Continue Lexapro  as above.  Start adjunctive modafinil 100 mg for 3 days, then increase to 200 mg daily.  Starting sleep log.  - 10-16: Sleep log appropriate, much improved today.  Pleasant, appropriate.  - 10-17: Continues to do well, but with severe cognitive and attention deficits.  Switch Provigil to Ritalin 5 mg twice daily starting tomorrow.   10/18 obsv today with ritalin on board. Had not yet received when I saw him this am.  5. Neuropsych/cognition: This patient is not fully capable of making decisions on his own behalf.   - 6. Skin/Wound Care: Routine pressure relief measures              -Stage 2 Sacral wound--wound care orders placed--Gerhardt's butt cream +Zinc  PO    7. Fluids/Electrolytes/Nutrition: Monitor intake and output.  CBC/CMET in a.m.             - SLP  consult             -Cardiac diet +Ensure, Zinc  and vitamin C    - 10-15: Albumin 2.8.  Poor p.o. intakes.  Monitor, dietary to assess  - 10-18 po intake variable. Needs cueing and encouragement.   -ask RD for assist 8.  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  Atorvastatin  80 mg daily.   9.  CAD/HLD: Atorvastatin    9.  Acute cystitis: Urine culture ESBL E. Coli---Treated with meropenem then 1 dose of fosfomycin.   10.  Hx urinary retention/BPH: Foley catheter present--Chronic since 07/2023   - Continue with Foley for now  11.  HTN: Resumed losartan  25 mg daily    - Monitor vitals with initiation of therapies    01/28/2024    4:52 AM 01/27/2024    7:20 PM 01/27/2024    4:29 PM  Vitals with BMI  Systolic 129 124 865  Diastolic 60 65 73  Pulse 69 86 83     12.  T2DM: A1c 7.5% continue CBGs ac/hs with SSI.  On chart review from 11-2023, Home regimen was metformin  500 mg twice daily, Lantus  10 units every morning/15 units every afternoon.             - Lantus  increased to 5 units twice daily--monitor for hypoglycemia Recent Labs    01/27/24 1637 01/27/24 2128 01/28/24 0540  GLUCAP 124* 160* 129*      - 10-15: Blood sugars very elevated today.  Increase Lantus  to 10 units twice daily.  Resume metformin  500 mg twice daily--monitor   - 10/17: increase lantus  to home 10 U QAM / 15 U QPM  10/18 improved but not eating much 13.  Heart block: S/p PPM   14.  GERD: Continue PPI    15. Constipation?- Pt didn't remember LBM- was earlier this AM- medium type 6- incontinent   - Last bowel movement 10-17.  16.  Left lower extremity hamstring tightness.  Not velocity dependent, but very limiting for coordination.  - 10/16: Start  baclofen 5 mg 3 times daily --10/17 left lower extremity tone much improved with this, but may be contributing to confusion.  Will move medication to as needed  10/18 baclofen removed d/t AMS. Continue with ROM/activities with therapy LOS: 4 days A FACE TO FACE EVALUATION WAS PERFORMED  Arthea ONEIDA Gunther 01/28/2024, 10:44 AM

## 2024-01-28 NOTE — Code Documentation (Signed)
 Stroke Response Nurse Documentation Code Documentation  John [name] is a 78 y.o. male admitted to [facility]  on 01/16/2024 for pontine stroke with past medical hx of HTN, HLD, DM, CVA, cognitive deficits, CAD, heart block s/p PPM, BPH, urinary retention with chronic foley. On aspirin  81 mg daily and clopidogrel  75 mg daily. Code stroke was activated by Rapid Response .   Patient on 4W unit where he was LKW at 1200 and now complaining of hypotensive and unresponsive. Per RN, patient was in his usual state of health at 1200 when they got him up to the chair. When she came back in the room to check on him she found him slightly slumped over. Rapid Response was called, BP and blood sugar taken. Patient was found to be hypotensive at 56/44.  Stroke team at the bedside after patient activation. Patient to CT with team. NIHSS 24, see documentation for details and code stroke times. Patient with decreased LOC, disoriented, not following commands, bilateral arm weakness, bilateral leg weakness, left decreased sensation, Global aphasia , dysarthria , and Sensory  neglect on exam. The following imaging was completed:  CT Head. Patient is not a candidate for IV Thrombolytic due to no acute stroke per MD. Patient is not a candidate for IR due to no LVO noted on imaging per MD.   Care/Plan: Code Stroke Canceled per MD.   Process Delays Noted: none  Bedside handoff with RN Ulla.    Annabella [name] Bame  Stroke Response RN

## 2024-01-28 NOTE — Progress Notes (Signed)
 Inpatient Rehabilitation Discharge Medication Review by a Pharmacist  A complete drug regimen review was completed for this patient to identify any potential clinically significant medication issues.  High Risk Drug Classes Is patient taking? Indication by Medication  Antipsychotic No   Anticoagulant Yes Enoxaparin  - VTE prophylaxis  Antibiotic Yes Ceftriaxone  - UTI  Opioid No   Antiplatelet Yes ASA - CVA Clopidogrel  - CVA  Hypoglycemics/insulin  Yes Insulin  aspart - DM Insulin  glargine - DM Metformin  - DM  Vasoactive Medication Yes Losartan  - HTN, HFrEF  Chemotherapy No   Other Yes APAP prn pain Maalox prn indigestion Vitamin C, Zinc  - supplement Atorvastatin  - CAD, HLD Docusate, Senna, Fleet - bowel regimen Escitalopram  - mood Melatonin prn sleep Ondansetron  prn nausea Pantoprazole  - GERD Methylphenidate - wakefulness/mood Robitussin DM - cough Finasteride  - urinary retention     Type of Medication Issue Identified Description of Issue Recommendation(s)  Drug Interaction(s) (clinically significant)     Duplicate Therapy     Allergy     No Medication Administration End Date  Plan DAPT x 21 days, then ASA alone Clopidogrel  stop date 10/28  Incorrect Dose     Additional Drug Therapy Needed     Significant med changes from prior encounter (inform family/care partners about these prior to discharge). PTA meds not restarted at this time include:  Cyanocobalamin , Ferrous Sulfate , MVI, Miralax    Other       Clinically significant medication issues were identified that warrant physician communication and completion of prescribed/recommended actions by midnight of the next day:  Yes  Name of provider notified for urgent issues identified:   Provider Method of Notification: communicated Clopidogrel  stop date with inpatient hospitalist team, Dr. Kathrin.  Stop date added.    Pharmacist comments:   Time spent performing this drug regimen review (minutes):  15   Larraine Brazier, PharmD Clinical Pharmacist 01/28/2024  9:21 PM **Pharmacist phone directory can now be found on [email or web address] ([email or web address]).  Listed under [facility].

## 2024-01-28 NOTE — Consult Note (Addendum)
 NEUROLOGY CONSULT NOTE   Date of service: January 28, 2024 Patient Name: John [name] MRN:  [id number] DOB:  [birth year] Chief Complaint: Code stroke  Requesting Provider: [name], DO  History of Present Illness  John [name] is a 78 y.o. male with hx of  bilateral lens replacement for cataracts, GERD, hyperlipidemia, hypertension, diabetes, cognitive decline, recent R pontine stroke with residual L sided weakness who was just discharged from [facility] back to his home on 01/16/2024.  Upon discharge, patient was able to get into the car with assistance from family. Mri brain obtained and revealed a right pontine infarct and CTA head and neck with severe right posterior cerebral artery stenosis. He was started on ASA and Plavix  for 3 weeks. He was admitted to rehab on 10/14. On 10/18 he was sitting in the chair and he was noted to be unresponsive and hypotensive 56/44. LKW 1200. When RN came to check on him he was slumped over and not responding. He was started on fluids. As per PMR note, patient grasped his hand when cued and with sternal rub, but otherwise did not open eyes or follow commands. Therefore, RRT was called and code stroke activated.  Patient was taken to Ct scan. NIHSS 25. CT head with no acute process. Patient was starting to arouse more after CT. Code stroke cancelled.   LKW: 1200 Modified rankin score: 2-Slight disability-UNABLE to perform all activities but does not need assistance IV Thrombolysis:  No acute stroke  EVT:  No LVO    NIHSS components Score: Comment  1a Level of Conscious 0[]  1[]  2[x]  3[]      1b LOC Questions 0[]  1[]  2[x]       1c LOC Commands 0[]  1[]  2[x]       2 Best Gaze 0[x]  1[]  2[]       3 Visual 0[]  1[]  2[]  3[x]      4 Facial Palsy 0[x]  1[]  2[]  3[]      5a Motor Arm - left 0[]  1[]  2[x]  3[]  4[]  UN[]    5b Motor Arm - Right 0[]  1[]  2[]  3[x]  4[]  UN[]    6a Motor Leg - Left 0[]  1[]  2[x]  3[]  4[]  UN[]    6b Motor Leg - Right 0[]  1[]  2[x]  3[]   4[]  UN[]    7 Limb Ataxia 0[x]  1[]  2[]  UN[]      8 Sensory 0[]  1[x]  2[]  UN[]      9 Best Language 0[]  1[]  2[]  3[x]      10 Dysarthria 0[]  1[]  2[x]  UN[]      11 Extinct. and Inattention 0[]  1[x]  2[]       TOTAL: 25      ROS  Comprehensive ROS Unable to ascertain due to AMS  Past History   Past Medical History:  Diagnosis Date   BPH (benign prostatic hyperplasia)    CTS (carpal tunnel syndrome)    Depression    Diabetes mellitus    Essential hypertension    GERD (gastroesophageal reflux disease)    History of breast cancer    Hypercholesteremia    Neuropathy    Stroke [facility])     Past Surgical History:  Procedure Laterality Date   CATARACT EXTRACTION, BILATERAL  01/18/2017   CORONARY STENT INTERVENTION N/A 08/10/2023   Procedure: CORONARY STENT INTERVENTION;  Surgeon: Darron Deatrice LABOR, MD;  Location: [facility] INVASIVE CV LAB;  Service: Cardiovascular;  Laterality: N/A;   LEFT HEART CATH AND CORONARY ANGIOGRAPHY N/A 08/10/2023   Procedure: LEFT HEART CATH AND CORONARY ANGIOGRAPHY;  Surgeon: Darron Deatrice LABOR, MD;  Location: [facility] INVASIVE CV LAB;  Service: Cardiovascular;  Laterality: N/A;   LITHOTRIPSY     MASTECTOMY Right 05/02/2018   PACEMAKER IMPLANT N/A 05/13/2023   Procedure: PACEMAKER IMPLANT;  Surgeon: Kennyth Chew, MD;  Location: [facility] INVASIVE CV LAB;  Service: Cardiovascular;  Laterality: N/A;   TEMPORARY PACEMAKER N/A 05/11/2023   Procedure: TEMPORARY PACEMAKER;  Surgeon: [place]az[place], Peter M, MD;  Location: [facility] INVASIVE CV LAB;  Service: Cardiovascular;  Laterality: N/A;   TONSILLECTOMY AND ADENOIDECTOMY     TRANSESOPHAGEAL ECHOCARDIOGRAM (CATH LAB) N/A 12/08/2023   Procedure: TRANSESOPHAGEAL ECHOCARDIOGRAM;  Surgeon: Raford Riggs, MD;  Location: [facility] INVASIVE CV LAB;  Service: Cardiovascular;  Laterality: N/A;    Family History: Family History  Problem Relation Age of Onset   Lymphoma Mother    Cancer Mother    Diabetes Father    Stroke Father    Ovarian cancer Sister     Cancer Sister    Cancer Paternal Grandmother     Social History  reports that he has never smoked. He has never used smokeless tobacco. He reports that he does not drink alcohol and does not use drugs.  Allergies  Allergen Reactions   Ramipril Anaphylaxis   Adhesive [Tape] Rash   Other Diarrhea    Severe intolerance to Chemotherapy in the past.   Sertraline Other (See Comments)    Extreme headaches    Medications   Current Facility-Administered Medications:    0.9 %  sodium chloride  infusion, , Intravenous, Continuous, Babs Arthea DASEN, MD   acetaminophen  (TYLENOL ) tablet 325-650 mg, 325-650 mg, Oral, Q4H PRN, Jerilynn Daphne SAILOR, NP   alum & mag hydroxide-simeth (MAALOX/MYLANTA) 200-200-20 MG/5ML suspension 30 mL, 30 mL, Oral, Q4H PRN, Jerilynn Daphne SAILOR, NP   ascorbic acid  (VITAMIN C) tablet 1,000 mg, 1,000 mg, Oral, Daily, Jerilynn Daphne N, NP, 1,000 mg at 01/28/24 1002   aspirin  EC tablet 81 mg, 81 mg, Oral, Daily, Jerilynn Daphne SAILOR, NP, 81 mg at 01/28/24 1002   atorvastatin  (LIPITOR ) tablet 80 mg, 80 mg, Oral, QHS, Jerilynn Daphne SAILOR, NP, 80 mg at 01/27/24 2108   bisacodyl  (DULCOLAX) suppository 10 mg, 10 mg, Rectal, Daily PRN, Jerilynn Daphne SAILOR, NP   Chlorhexidine  Gluconate Cloth 2 % PADS 6 each, 6 each, Topical, BID, Engler, Morgan C, DO, 6 each at 01/28/24 0514   clopidogrel  (PLAVIX ) tablet 75 mg, 75 mg, Oral, Q breakfast, Pham, Minh Q, RPH-CPP, 75 mg at 01/28/24 1002   docusate sodium  (COLACE) capsule 100 mg, 100 mg, Oral, Q supper, Jerilynn Daphne SAILOR, NP, 100 mg at 01/27/24 1745   enoxaparin  (LOVENOX ) injection 40 mg, 40 mg, Subcutaneous, Q24H, Jerilynn Daphne SAILOR, NP, 40 mg at 01/28/24 1003   escitalopram  (LEXAPRO ) tablet 10 mg, 10 mg, Oral, Daily, Jerilynn Daphne N, NP, 10 mg at 01/28/24 1002   feeding supplement (GLUCERNA SHAKE) (GLUCERNA SHAKE) liquid 237 mL, 237 mL, Oral, BID BM, Jerilynn Daphne SAILOR, NP, 237 mL at 01/28/24 1003   finasteride  (PROSCAR ) tablet 5 mg,  5 mg, Oral, Daily, Emeline, Morgan C, DO, 5 mg at 01/28/24 1002   Gerhardt's butt cream, , Topical, BID, Jerilynn Daphne SAILOR, NP, Given at 01/28/24 1003   guaiFENesin -dextromethorphan  (ROBITUSSIN DM) 100-10 MG/5ML syrup 5-10 mL, 5-10 mL, Oral, Q6H PRN, Jerilynn Daphne SAILOR, NP   insulin  aspart (novoLOG ) injection 0-9 Units, 0-9 Units, Subcutaneous, TID WC, Hammons, Kimberly B, RPH, 1 Units at 01/28/24 0649   insulin  aspart (novoLOG ) injection 3 Units, 3 Units, Subcutaneous, TID WC, Jerilynn Daphne  N, NP, 3 Units at 01/28/24 1002   insulin  glargine-yfgn (SEMGLEE ) injection 10 Units, 10 Units, Subcutaneous, Daily, 10 Units at 01/28/24 1002 **AND** insulin  glargine-yfgn (SEMGLEE ) injection 15 Units, 15 Units, Subcutaneous, QHS, Engler, Morgan C, DO, 15 Units at 01/27/24 2313   losartan  (COZAAR ) tablet 25 mg, 25 mg, Oral, Daily, Jerilynn Jarvis N, NP, 25 mg at 01/28/24 1002   melatonin tablet 6 mg, 6 mg, Oral, QHS PRN, Jerilynn Jarvis SAILOR, NP   metFORMIN  (GLUCOPHAGE ) tablet 500 mg, 500 mg, Oral, BID WC, Engler, Morgan C, DO, 500 mg at 01/28/24 1002   methylphenidate (RITALIN) tablet 5 mg, 5 mg, Oral, BID WC, Engler, Morgan C, DO, 5 mg at 01/28/24 1002   ondansetron  (ZOFRAN ) tablet 4 mg, 4 mg, Oral, Q6H PRN **OR** ondansetron  (ZOFRAN ) injection 4 mg, 4 mg, Intravenous, Q6H PRN, Jerilynn Jarvis SAILOR, NP, 4 mg at 01/27/24 1655   pantoprazole  (PROTONIX ) EC tablet 40 mg, 40 mg, Oral, QHS, Jerilynn Jarvis SAILOR, NP, 40 mg at 01/27/24 2108   senna-docusate (Senokot-S) tablet 1 tablet, 1 tablet, Oral, QHS PRN, Jerilynn Jarvis SAILOR, NP   sodium phosphate  (FLEET) enema 1 enema, 1 enema, Rectal, Once PRN, Jerilynn Jarvis SAILOR, NP   zinc  sulfate (50mg  elemental zinc ) capsule 220 mg, 220 mg, Oral, QHS, Engler, Morgan C, DO, 220 mg at 01/27/24 2108  Vitals   Vitals:   01/28/24 1321 01/28/24 1326 01/28/24 1333 01/28/24 1415  BP: (P) 116/60 (!) (P) 113/56 (!) 115/52 120/72  Pulse:   73 91  Resp:    16  Temp:    (!) 97.5 F  (36.4 C)  TempSrc:    Axillary  SpO2:   98% 100%  Weight:      Height:        Body mass index is 23.92 kg/m.   Physical Exam   Constitutional: Appears well-developed however pale. Eyes: No scleral injection.   HENT: No OP obstruction.   Head: Normocephalic.   Cardiovascular: Normal rate and regular rhythm as per monitor. Respiratory: Effort normal, non-labored breathing.   GI: Soft.   Skin: Pale, clammy.  Neurologic Examination   Mental Status -  Lethargic not following commands or answering questions.   Cranial Nerves II - XII - II - PERRL (sluggish, however s/p lens replacements); no blink to threat bilaterally III, IV, VI - Roving eyes V - Facial sensation intact bilaterally . VII - Facial movement intact bilaterally with grimace to noxious VIII - UTA X - UTA XI - UTA XII - UTA  Motor Strength - left arm with drift, right arm with movement to noxious stimuli, bilateral lowers withdrawal  Sensory - decreased left sensation  Coordination - not able to assess   Gait and Station - deferred.  Labs/Imaging/Neurodiagnostic studies   CBC:  Recent Labs  Lab 02/06/2024 0523 01/26/24 0516 01/28/24 1411  WBC 8.1 7.4 12.4*  NEUTROABS 4.8  --   --   HGB 12.4* 12.2* 12.6*  HCT 36.7* 35.9* 39.1  MCV 87.6 88.2 90.5  PLT 233 225 304   Basic Metabolic Panel:  Lab Results  Component Value Date   NA 135 01/26/2024   K 3.9 01/26/2024   CO2 23 01/26/2024   GLUCOSE 281 (H) 01/26/2024   BUN 20 01/26/2024   CREATININE 0.77 01/26/2024   CALCIUM  9.4 01/26/2024   GFRNONAA >60 01/26/2024   GFRAA >60 10/27/2019   Lipid Panel:  Lab Results  Component Value Date   LDLCALC 34 01/19/2024   HgbA1c:  Lab Results  Component Value Date   HGBA1C 8.5 (H) 01/17/2024   Urine Drug Screen:     Component Value Date/Time   LABOPIA NONE DETECTED 08/07/2023 0659   COCAINSCRNUR NONE DETECTED 08/07/2023 0659   LABBENZ NONE DETECTED 08/07/2023 0659   AMPHETMU NONE DETECTED  08/07/2023 0659   THCU NONE DETECTED 08/07/2023 0659   LABBARB NONE DETECTED 08/07/2023 0659    Alcohol Level     Component Value Date/Time   ETH <15 08/06/2023 2344   INR  Lab Results  Component Value Date   INR 1.4 (H) 08/06/2023   APTT  Lab Results  Component Value Date   APTT 79 (H) 08/11/2023   AED levels: No results found for: PHENYTOIN, ZONISAMIDE, LAMOTRIGINE, LEVETIRACETA  CT Head without contrast(Personally reviewed): No acute process   ASSESSMENT   John [name] is a 78 y.o. male  of  GERD, hyperlipidemia, hypertension, diabetes, cognitive decline, prior stroke with residual L sided weakness with recent admission for acute right pontine stroke and admitted to rehab. Noted to be hypotensive and unresponsive. Code stroke activated. CT head with no acute process. Presentation more consistent with encephalopathy. Code stroke cancelled   RECOMMENDATIONS  Cancel code stroke  EEG to evaluate for seizures  Evaluate for toxic metabolic encephalopathy  ______________________________________________________________________    Signed, Karna [name] Geralds, NP [facility]  NEUROHOSPITALIST ADDENDUM Performed a face to face diagnostic evaluation.   I have reviewed the contents of history and physical exam as documented by PA/ARNP/Resident and agree with above documentation.  I have discussed and formulated the above plan as documented. Edits to the note have been made as needed.  Leoma Folds, MD [facility]

## 2024-01-28 NOTE — Progress Notes (Signed)
 Came to check on pt was up in chair unresponsive with 16 RR and SBP of 72. Called Rapid response transferred pt back to bed. Started IVF of NS current cbg 168 and activated Code Stroke. Stroke team and rapid nurse transferred pt off unit. See new orders.

## 2024-01-28 NOTE — Progress Notes (Signed)
 Occupational Therapy Session Note  Patient Details  Name: John [name] MRN: [id number] Date of Birth: [birth year]  Today's Date: 01/28/2024 OT Individual Time: 8882-8840 OT Individual Time Calculation (min): 42 min    Short Term Goals: Week 1:  OT Short Term Goal 1 (Week 1): Pt will complete LB dressing at Max A with AE as necessary OT Short Term Goal 2 (Week 1): Pt will complete toilet transfer at Max A with LRAD OT Short Term Goal 3 (Week 1): Pt will use Lt hand as stabilizer during ADL activities with Mod VCs  Skilled Therapeutic Interventions/Progress Updates: Patient received sitting up in reclined w/c. Agreeable to OT treatment, but with flat response to therapist. Patient rolled to gym for NMRE activities. Initiated treatment with functional reach, assisted, of the LUE working on grasp release at shoulder level and upward reaching with assist at the wrist and elbow. Patient with good attention to direction and participation with UE movements. Continued treatment with transition to EOM for functional sitting balance. Patient able to perform squat pivot transfer to the right with Mod assist of 2. Good reach with RUE towards mat and good push with LUE on w/c arm rest. Sitting EOM patient encouraged to sit with head up and utilize mirror in front for visual cues to find midline and improve posture. Used bedside table at the patient's R side to encourage up right posture by wt. Bearing through R forearm on table cues at [facility] joint to bring chest up with vc's reminding patient to hold head up and look at his posture in the mirror. Patient leaned back against wedge standing on end and held in place by therapist to work on scapular adduction and chin tuck with trunk support. Patient able to perform chin tuck, improving cervical alignment, but only able to hold the posture for a few seconds before letting head fall forward. Patient reporting back pain and fatigue and assisted back to w/c with a squat  pivot max of one. Patient reported feeling better with w/c back support and set at a recline. Did not want further intervention. Patient assisted back to room and agreeable to sitting up through lunch. Patient given call bell to place on lap. Continue with skilled OT POC to improve activity tolerance and functional sitting balance.      Therapy Documentation Precautions:  Precautions Precautions: Fall Recall of Precautions/Restrictions: Impaired Precaution/Restrictions Comments: L hemiparesis Restrictions Weight Bearing Restrictions Per Provider Order: Yes Other Position/Activity Restrictions: L sided weakness General: General PT Missed Treatment Reason: Other (Comment) (pt unresponsive with RR and code stroke called) Vital Signs: Therapy Vitals Temp: (!) 97.5 F (36.4 C) Temp Source: Axillary Pulse Rate: 93 Resp: 17 BP: (!) 107/47 Patient Position (if appropriate): Lying Oxygen Therapy SpO2: 99 % O2 Device: Nasal Cannula O2 Flow Rate (L/min): 2 L/min Patient Activity (if Appropriate): In bed Pulse Oximetry Type: Intermittent Pain:   ADL: ADL Eating: Set up Grooming: Minimal assistance, Moderate cueing Where Assessed-Grooming: Bed level Upper Body Bathing: Maximal assistance (clinical judgement) Lower Body Bathing: Maximal assistance (clinical judgement) Upper Body Dressing: Maximal assistance Lower Body Dressing: Dependent (clinical judgement) Where Assessed-Lower Body Dressing: Bed level Toileting: Not assessed Tub/Shower Transfer: Not assessed Walk-In Shower Transfer: Not assessed ADL Comments: limited eval d/t limited pt participation and extreme fatigue   Therapy/Group: Individual Therapy  [name] 01/28/2024, 3:28 PM

## 2024-01-28 NOTE — Progress Notes (Addendum)
 Called because pt's blood pressure dropped and he became increasingly unresponsive. SBP in 70's by report. Rapid response nurse called to see pt. NS IVF started. BP recovering to 115/52 but patient remains obtunded. CBG 158.  Pt grasped hand when cued and with sternal rub but otherwise not opening eyes or following commands. Pupils reactive somewhat reactive to light. Code stroke called.   Plan: NS IVF  Stat Head CT Stat BMET, CBC, ckmb, troponin, EKG O2 2L Supportive care Will likely need transfer back to acute for further care.   I was able to reach his wife to make her aware.   Pt is DNR  Arthea IVAR Gunther, MD, [facility] Physical Medicine & Rehabilitation Medical Director Rehabilitation Services 01/28/2024

## 2024-01-28 NOTE — Progress Notes (Signed)
 Occupational Therapy Session Note  Patient Details  Name: John [name] MRN: [id number] Date of Birth: June 19, 1945  Today's Date: 01/28/2024 OT Individual Time: 9064-8984 OT Individual Time Calculation (min): 40 min    Short Term Goals: Week 1:  OT Short Term Goal 1 (Week 1): Pt will complete LB dressing at Max A with AE as necessary OT Short Term Goal 2 (Week 1): Pt will complete toilet transfer at Max A with LRAD OT Short Term Goal 3 (Week 1): Pt will use Lt hand as stabilizer during ADL activities with Mod VCs  Skilled Therapeutic Interventions/Progress Updates:  Pt greeted resting in bed for skilled OT session with focus on BADL retraining.   Pain: Pt with no reports of pain. OT offering intermediate rest breaks and positioning suggestions throughout session to address pain/fatigue and maximize participation/safety in session.   Functional Transfers: Bed mobility with Mod A for BLE management and trunk elevation, although improved ability to slide BLE towards EOB. Stand-pivot from EOB>TIS WC with Max A (+2 Mod). Multiple sit<>stands during ADL care with Max A (+2 Min A) + use of RW for BUE support. Static stance with Max A + RW, strong L lateral lean.   Self Care Tasks: Pt completes the following self care tasks with levels of assistance noted below, UB: Max A provided due to time constraints. Face washing with setup.  LB: Pt able to reach towards distal LE(s) for bathing (assistance for thoroughness). Dependent for posterior care in standing. Threading with Min A, dependent for hike in standing. OT assisting with foley bag management.   Pt remained sitting in TIS WC with 4Ps assessed and immediate needs met. Pt continues to be appropriate for skilled OT intervention to promote further functional independence in ADLs/IADLs.   Therapy Documentation Precautions:  Precautions Precautions: Fall Recall of Precautions/Restrictions: Impaired Precaution/Restrictions Comments: L  hemiparesis Restrictions Weight Bearing Restrictions Per Provider Order: Yes Other Position/Activity Restrictions: L sided weakness   Therapy/Group: Individual Therapy  [name], OTR/L, MSOT  01/28/2024, 7:01 AM

## 2024-01-28 NOTE — Progress Notes (Signed)
 Pt slept for 5 hours, falling asleep shortly after midnight, refused prn sleep aide.

## 2024-01-28 NOTE — Progress Notes (Signed)
 Pharmacy Antibiotic Note  John [name] is a 78 y.o. male admitted on 01/28/2024 with sepsis.  Pharmacy has been consulted for Vancomycin  and Meropenem dosing.  Plan: Vancomycin  1750 MG IV X 1 dose 1500 mg IV every 24 hours.  Goal trough 15-20 mcg/mL. Meropenem 1 g IV Q 8H  Height: 6' (182.9 cm) Weight: 83 kg (182 lb 15.7 oz) IBW/kg (Calculated) : 77.6  Temp (24hrs), Avg:98.2 F (36.8 C), Min:97.5 F (36.4 C), Max:99 F (37.2 C)  Recent Labs  Lab 01/25/24 0523 01/26/24 0516 01/28/24 1411 01/28/24 1813  WBC 8.1 7.4 12.4*  --   CREATININE 0.84 0.77 1.19  --   LATICACIDVEN  --   --   --  3.6*    Estimated Creatinine Clearance: 57.1 mL/min (by C-G formula based on SCr of 1.19 mg/dL).    Allergies  Allergen Reactions   Ramipril Anaphylaxis   Adhesive [Tape] Rash   Other Diarrhea    Severe intolerance to Chemotherapy in the past.   Sertraline Other (See Comments)    Extreme headaches    Antimicrobials this admission: Vancomycin  10/18 >>  Meropenem 10/18 >>   Thank you for allowing pharmacy to be a part of this patient's care.  Larraine Brazier, PharmD Clinical Pharmacist 01/28/2024  9:34 PM **Pharmacist phone directory can now be found on [email or web address] ([email or web address]).  Listed under [facility].

## 2024-01-28 NOTE — H&P (Signed)
 History and Physical    John [name] FMW:[id number] DOB: [birth year] DOA: 01/28/2024  PCP: [name], DO   Patient coming from: CIR   Chief Complaint: Hypotensive and unresponsive   HPI: John [name] is a 78 y.o. 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  Patient was said to have had a normal morning but was noted to be unresponsive early this afternoon.  He was found to be hypotensive with BP 56/44.  He was started on IV fluids with improvement in BP.  He began to respond to painful stimuli.  Code stroke was called and the patient was evaluated by neurology who canceled the code stroke, suspecting encephalopathy rather than CVA.  Workup was undertaken and includes head CT with no acute findings, EEG suggestive of encephalopathy, labs with metabolic acidosis, increased BUN to creatinine ratio, new leukocytosis, lactic acid 3.6, pyuria, and bacteriuria.  Urine was sent for culture and the patient was transferred to the inpatient unit under the hospitalist service.   Review of Systems:  All other systems reviewed and apart from HPI, are negative.  Past Medical History:  Diagnosis Date   BPH (benign prostatic hyperplasia)    CTS (carpal tunnel syndrome)    Depression    Diabetes mellitus    Essential hypertension    GERD (gastroesophageal reflux disease)    History of breast cancer    Hypercholesteremia    Neuropathy    Stroke [facility])     Past Surgical History:  Procedure Laterality Date   CATARACT EXTRACTION, BILATERAL  01/18/2017   CORONARY STENT INTERVENTION N/A 08/10/2023   Procedure: CORONARY STENT  INTERVENTION;  Surgeon: Darron Deatrice LABOR, MD;  Location: [facility] INVASIVE CV LAB;  Service: Cardiovascular;  Laterality: N/A;   LEFT HEART CATH AND CORONARY ANGIOGRAPHY N/A 08/10/2023   Procedure: LEFT HEART CATH AND CORONARY ANGIOGRAPHY;  Surgeon: Darron Deatrice LABOR, MD;  Location: [facility] INVASIVE CV LAB;  Service: Cardiovascular;  Laterality: N/A;   LITHOTRIPSY     MASTECTOMY Right 05/02/2018   PACEMAKER IMPLANT N/A 05/13/2023   Procedure: PACEMAKER IMPLANT;  Surgeon: Kennyth Chew, MD;  Location: [facility] INVASIVE CV LAB;  Service: Cardiovascular;  Laterality: N/A;   TEMPORARY PACEMAKER N/A 05/11/2023   Procedure: TEMPORARY PACEMAKER;  Surgeon: [place], Peter M, MD;  Location: [facility] INVASIVE CV LAB;  Service: Cardiovascular;  Laterality: N/A;   TONSILLECTOMY AND ADENOIDECTOMY     TRANSESOPHAGEAL ECHOCARDIOGRAM (CATH LAB) N/A 12/08/2023   Procedure: TRANSESOPHAGEAL ECHOCARDIOGRAM;  Surgeon: Raford Riggs, MD;  Location: [facility] INVASIVE CV LAB;  Service: Cardiovascular;  Laterality: N/A;    Social History:   reports that he has never smoked. He has never used smokeless tobacco. He reports that he does not drink alcohol and does not use drugs.  Allergies  Allergen Reactions   Ramipril Anaphylaxis   Adhesive [Tape] Rash   Other Diarrhea    Severe intolerance to Chemotherapy in the past.   Sertraline Other (See Comments)    Extreme headaches    Family History  Problem Relation Age of Onset   Lymphoma Mother    Cancer Mother    Diabetes Father    Stroke Father    Ovarian cancer Sister    Cancer Sister  Cancer Paternal Grandmother      Prior to Admission medications   Medication Sig Start Date End Date Taking? Authorizing Provider  acetaminophen  (TYLENOL ) 325 MG tablet Take 2 tablets (650 mg total) by mouth every 6 (six) hours as needed for mild pain (pain score 1-3) or fever (or Fever >/= 101). Patient taking differently: Take 650 mg by mouth every 8 (eight) hours as needed for mild pain  (pain score 1-3) or fever (or Fever >/= 101). 12/30/23   Sherrill Cable Latif, DO  Ascorbic Acid  (VITAMIN C) 1000 MG tablet Take 1,000 mg by mouth 2 (two) times a week.    [provider]  aspirin  EC 81 MG tablet Take 1 tablet (81 mg total) by mouth daily. Swallow whole. 01/25/24   Drusilla Sabas RAMAN, MD  atorvastatin  (LIPITOR ) 80 MG tablet Take 1 tablet (80 mg total) by mouth at bedtime. 09/14/23   [name], DO  bisacodyl  (DULCOLAX) 10 MG suppository Place 10 mg rectally daily as needed for moderate constipation.    [provider]  clopidogrel  (PLAVIX ) 75 MG tablet Take 1 tablet (75 mg total) by mouth daily with breakfast for 21 days. 01/17/24 02/07/24  Drusilla Sabas RAMAN, MD  clotrimazole -betamethasone  (LOTRISONE ) cream Apply 1 Application topically daily. Patient not taking: Reported on 01/17/2024 07/11/23   [name], DO  cyanocobalamin  1000 MCG tablet Take 1 tablet (1,000 mcg total) by mouth daily. 09/14/23   [name], DO  escitalopram  (LEXAPRO ) 10 MG tablet Take 1 tablet (10 mg total) by mouth daily. 10/03/23   [name], DO  ferrous sulfate  (FEROSUL) 325 (65 FE) MG tablet Take 1 tablet (325 mg total) by mouth daily with breakfast. 11/28/23   [name], DO  finasteride  (PROSCAR ) 5 MG tablet Take 1 tablet (5 mg total) by mouth daily. *Need appointment for future refills.* 11/28/23   Wendling, Mabel Mt, DO  hyoscyamine  (LEVSIN  SL) 0.125 MG SL tablet Place 1 tablet (0.125 mg total) under the tongue every 4 (four) hours as needed for bladder spasms. Patient not taking: Reported on 01/17/2024 08/17/23   Angiulli, Toribio PARAS, PA-C  insulin  aspart (NOVOLOG ) 100 UNIT/ML injection Inject 3 Units into the skin 3 (three) times daily with meals. 01/24/24   Drusilla Sabas RAMAN, MD  insulin  aspart (NOVOLOG ) 100 UNIT/ML injection Inject 0-9 Units into the skin 3 (three) times daily with meals. Sliding scale insulin  Less than 70 initiate  hypoglycemia protocol 70-120  0 units 120-150 1 unit 151-200 2 units 201-250 3 units 251-300 5 units 301-350 7 units 351-400 9 units  Greater than 400 call MD 01/24/24   Drusilla Sabas RAMAN, MD  insulin  glargine (LANTUS ) 100 UNIT/ML injection Inject 0.15 mLs (15 Units total) into the skin daily. 01/02/24   Leotis Bogus, MD  losartan  (COZAAR ) 25 MG tablet Take 1 tablet (25 mg total) by mouth daily. 12/23/23   Jillian Buttery, MD  magnesium  hydroxide (MILK OF MAGNESIA) 400 MG/5ML suspension Take 30 mLs by mouth daily as needed for mild constipation.    [provider]  metFORMIN  (GLUCOPHAGE ) 500 MG tablet Take 1 tablet (500 mg total) by mouth 2 (two) times daily with a meal. 12/22/23   Jillian Buttery, MD  Multiple Vitamin (MULTIVITAMIN) tablet Take 1 tablet by mouth 2 (two) times a week.    [provider]  ondansetron  (ZOFRAN -ODT) 4 MG disintegrating tablet Take 1 tablet (4 mg total) by mouth every 8 (eight) hours as needed for nausea or vomiting.  12/23/23   [name], DO  pantoprazole  (PROTONIX ) 40 MG tablet Take 1 tablet (40 mg total) by mouth at bedtime. 12/19/23   [name], DO  polyethylene glycol (MIRALAX  / GLYCOLAX ) 17 g packet Take 17 g by mouth daily. 12/30/23   Sherrill Alejandro Donovan, DO  PRESCRIPTION MEDICATION Place 1 suppository rectally daily as needed (Constipation).    [provider]  senna-docusate (SENOKOT-S) 8.6-50 MG tablet Take 1 tablet by mouth 2 (two) times daily. 12/15/23   Lue [name], MD  amLODipine  (NORVASC ) 2.5 MG tablet Take 1 tablet (2.5 mg total) by mouth daily. 06/15/23 06/30/23  [name], DO  apixaban  (ELIQUIS ) 5 MG TABS tablet Take 1 tablet (5 mg total) by mouth 2 (two) times daily. 10/11/23 10/11/23  [name], DO    Physical Exam: Vitals:   01/28/24 2038  BP: 124/63  Pulse: (!) 109  Resp: (!) 24  Temp: 99 F (37.2 C)  TempSrc: Axillary  SpO2: 98%  Weight: 83 kg  Height: 6'  (1.829 m)    Constitutional: NAD, no pallor or diaphoresis   Eyes: PERTLA, lids and conjunctivae normal ENMT: Mucous membranes are moist. Posterior pharynx clear of any exudate or lesions.   Neck: supple, no masses  Respiratory: no wheezing, no crackles. No accessory muscle use.  Cardiovascular: S1 & S2 heard, regular rate and rhythm. No extremity edema.  Abdomen: Soft, tender in LUQ. Bowel sounds active.  Musculoskeletal: no clubbing / cyanosis. No joint deformity upper and lower extremities.   Skin: no significant rashes, lesions, ulcers. Warm, dry, well-perfused. Neurologic: Opens eyes to loud voice and moves all extremities spontaneously but not making eye-contact, speaking, or following instructions.    Labs and Imaging on Admission: I have personally reviewed following labs and imaging studies  CBC: Recent Labs  Lab 01/25/24 0523 01/26/24 0516 01/28/24 1411  WBC 8.1 7.4 12.4*  NEUTROABS 4.8  --   --   HGB 12.4* 12.2* 12.6*  HCT 36.7* 35.9* 39.1  MCV 87.6 88.2 90.5  PLT 233 225 304   Basic Metabolic Panel: Recent Labs  Lab 01/24/24 1341 01/25/24 0523 01/26/24 0516 01/28/24 1411  NA  --  138 135 137  K  --  3.9 3.9 4.1  CL  --  102 101 102  CO2  --  27 23 15*  GLUCOSE 396* 174* 281* 154*  BUN  --  24* 20 32*  CREATININE  --  0.84 0.77 1.19  CALCIUM   --  9.9 9.4 9.7   GFR: Estimated Creatinine Clearance: 57.1 mL/min (by C-G formula based on SCr of 1.19 mg/dL). Liver Function Tests: Recent Labs  Lab 01/25/24 0523  AST 22  ALT 23  ALKPHOS 91  BILITOT 1.3*  PROT 5.5*  ALBUMIN 2.8*   No results for input(s): LIPASE, AMYLASE in the last 168 hours. No results for input(s): AMMONIA in the last 168 hours. Coagulation Profile: No results for input(s): INR, PROTIME in the last 168 hours. Cardiac Enzymes: Recent Labs  Lab 01/28/24 1411  CKTOTAL 44*  CKMB 3.1   BNP (last 3 results) Recent Labs    01/16/24 1659  PROBNP 465.0*   HbA1C: No  results for input(s): HGBA1C in the last 72 hours. CBG: Recent Labs  Lab 01/28/24 0540 01/28/24 1125 01/28/24 1328 01/28/24 1642 01/28/24 2226  GLUCAP 129* 147* 168* 155* 111*   Lipid Profile: No results for input(s): CHOL, HDL, LDLCALC, TRIG, CHOLHDL, LDLDIRECT in the last 72 hours.  Thyroid  Function Tests: No results for input(s): TSH, T4TOTAL, FREET4, T3FREE, THYROIDAB in the last 72 hours. Anemia Panel: No results for input(s): VITAMINB12, FOLATE, FERRITIN, TIBC, IRON, RETICCTPCT in the last 72 hours. Urine analysis:    Component Value Date/Time   COLORURINE YELLOW 01/28/2024 1845   APPEARANCEUR CLOUDY (A) 01/28/2024 1845   LABSPEC 1.020 01/28/2024 1845   PHURINE 5.0 01/28/2024 1845   GLUCOSEU NEGATIVE 01/28/2024 1845   HGBUR NEGATIVE 01/28/2024 1845   BILIRUBINUR NEGATIVE 01/28/2024 1845   KETONESUR NEGATIVE 01/28/2024 1845   PROTEINUR 30 (A) 01/28/2024 1845   NITRITE NEGATIVE 01/28/2024 1845   LEUKOCYTESUR MODERATE (A) 01/28/2024 1845   Sepsis Labs: @LABRCNTIP (procalcitonin:4,lacticidven:4) )No results found for this or any previous visit (from the past 240 hours).   Radiological Exams on Admission: DG CHEST PORT 1 VIEW Result Date: 01/28/2024 EXAM: 1 VIEW(S) XRAY OF THE CHEST 01/28/2024 06:32:00 PM COMPARISON: 01/16/2024 CLINICAL HISTORY: SOB (shortness of breath) 141880. Reason for exam: SHOB FINDINGS: LINES, TUBES AND DEVICES: Left chest cardiac pacing device in place. LUNGS AND PLEURA: Low lung volumes. No focal pulmonary opacity. No pulmonary edema. No pleural effusion. No pneumothorax. HEART AND MEDIASTINUM: No acute abnormality of the cardiac and mediastinal silhouettes. BONES AND SOFT TISSUES: No acute osseous abnormality. IMPRESSION: 1. No acute cardiopulmonary process. Electronically signed by: Norman Gatlin MD 01/28/2024 06:40 PM EDT RP Workstation: [id number]   EEG adult Result Date: 01/28/2024 Shelton Arlin KIDD, MD      01/28/2024  5:46 PM Patient Name: Makenzie Vittorio MRN: [id number] Epilepsy Attending: Arlin KIDD Shelton Referring Physician/Provider: Sallyann Normie HERO, MD Date: 01/28/2024 Duration: 22.29 mins Patient history: 78yo M with ams. EEG to evaluate for seizure Level of alertness: Awake AEDs during EEG study: None Technical aspects: This EEG study was done with scalp electrodes positioned according to the 10-20 International system of electrode placement. Electrical activity was reviewed with band pass filter of 1-70Hz , sensitivity of 7 uV/mm, display speed of 46mm/sec with a 60Hz  notched filter applied as appropriate. EEG data were recorded continuously and digitally stored.  Video monitoring was available and reviewed as appropriate. Description:  EEG showed continuous generalized 3 to 6 Hz theta-delta slowing. Hyperventilation and photic stimulation were not performed.   ABNORMALITY - Continuous slow, generalized IMPRESSION: This study is suggestive of generalized cerebral dysfunction (encephalopathy). No seizures or epileptiform discharges were seen throughout the recording. Arlin KIDD Shelton   CT HEAD CODE STROKE WO CONTRAST Result Date: 01/28/2024 EXAM: CT HEAD WITHOUT CONTRAST 01/28/2024 01:56:04 PM TECHNIQUE: CT of the head was performed without the administration of intravenous contrast. Automated exposure control, iterative reconstruction, and/or weight based adjustment of the mA/kV was utilized to reduce the radiation dose to as low as reasonably achievable. COMPARISON: CT head 01/16/2024 and CTA head and neck 01/19/2024. CLINICAL HISTORY: Neuro deficit, acute, stroke suspected. FINDINGS: BRAIN AND VENTRICLES: No acute hemorrhage. No evidence of acute infarct. Nonspecific hypoattenuation in the periventricular and subcortical white matter, most likely representing chronic microvascular ischemic changes. Remote lacunar infarcts in the bilateral thalami and bilateral basal ganglia. Atherosclerosis of the carotid  siphons and intracranial vertebral arteries. No hydrocephalus. No extra-axial collection. No mass effect or midline shift. ORBITS: Bilateral lens replacement. No acute abnormality. SINUSES: No acute abnormality. SOFT TISSUES AND SKULL: No acute soft tissue abnormality. No skull fracture. [place] stroke program early CT (aspect) score: Ganglionic (caudate, ic, lentiform nucleus, insula, M1-m3): 7 Supraganglionic (m4-m6): 3 Total: 10 IMPRESSION: 1. No acute intracranial abnormality. 2. Chronic microvascular ischemic changes. 3.  Remote lacunar infarcts in the bilateral thalami and basal ganglia. 4. Findings messaged to Dr. Sallyann at 2:02PM on 01/28/24. Electronically signed by: Donnice Mania MD 01/28/2024 02:02 PM EDT RP Workstation: [id number]    EKG: Independently reviewed. Sinus tachycardia, rate 104, LAD, RBBB.   Assessment/Plan   1. Acute encephalopathy  - Hypotensive and unresponsive early this afternoon, BP improved with IVF and patient began responding to pain - No acute findings on head CT; EEG suggestive of encephalopathy, no seizures seen  - Neurology suspects encephalopathy  - Infectious workup and empiric antibiotics as discussed below, check TSH, ammonia, RPR, and B12, use delirium precautions   2. Lactic acidosis, SIRS  - New leukocytosis, elevated HR, and lactic acidosis  - Urinary source possible; abdomen is tender, particularly in the LUQ  - Urine was cultured, will add blood cultures, check MRSA pcr and procalcitonin, start meropenem and vancomycin , check CT abdomen/pelvis, trend lactate, and monitor cultures and clinical course   3. Hx of CVA  - ASA, Plavix , Lipitor    4. Chronic HFrEF  - EF 30-35% on echo from August 2025  - Appears compensated  - Monitor volume status   5. Bladder outlet obstruction  - Continue Foley catheter, urology follow-up   6. Hypertension  - He was hypotensive earlier, will treat as-needed only for now    7. Insulin -dependent DM  - A1c was 8.5%  this month  - Check CBGs, use Lantus  and SSI   8. Cognitive impairment; depression; anxiety  - Continue Lexapro , use delirium precautions    DVT prophylaxis: Lovenox   Code Status: DNR/DNI Level of Care: Level of care: Progressive Family Communication: Wife and children at bedside  Disposition Plan:  Patient is from: CIR  Anticipated d/c is to: TBD Anticipated d/c date is: 01/30/24  Patient currently: Pending cultures, clinical course  Consults called: None  Admission status: Inpatient     Evalene GORMAN Sprinkles, MD [facility]  01/28/2024, 11:38 PM

## 2024-01-28 NOTE — Plan of Care (Signed)
  Problem: Consults Goal: RH STROKE PATIENT EDUCATION Description: See Patient Education module for education specifics  Outcome: Progressing   Problem: RH BOWEL ELIMINATION Goal: RH STG MANAGE BOWEL WITH ASSISTANCE Description: STG Manage Bowel with mod I Assistance. Outcome: Not Progressing Goal: RH STG MANAGE BOWEL W/MEDICATION W/ASSISTANCE Description: STG Manage Bowel with Medication with mod I Assistance. Outcome: Progressing   Problem: RH BLADDER ELIMINATION Goal: RH STG MANAGE BLADDER WITH EQUIPMENT WITH ASSISTANCE Description: STG Manage Bladder With Equipment With min Assistance Outcome: Progressing   Problem: RH SKIN INTEGRITY Goal: RH STG SKIN FREE OF INFECTION/BREAKDOWN Description: Manage skin w min assist Outcome: Progressing

## 2024-01-28 NOTE — Progress Notes (Addendum)
 Pt has pontine infarct and has been lethargic and at 1pm he was slumped in the room SBP 70's rapid response / code stroke called and was given IVF and abx and cultures collected.  Pt was dehydrated and started to come around and WBC is 12. Vitals:   01/28/24 1415 01/28/24 1524 01/28/24 1825 01/28/24 1857  BP: 120/72 (!) 107/47 122/66 (!) 178/83  Pulse: 91 93 (!) 106 (!) 117  Temp: (!) 97.5 F (36.4 C)   98.6 F (37 C)  Resp: 16 17  18   Height:      Weight:      SpO2: 100% 99% 99% 96%  TempSrc: Axillary   Oral  BMI (Calculated):        Labs are: Abnormal labs are concerning for metabolic acidosis.  Chest xray Urinalysis.  Telemetry.  Cultures.  Mental status is improving from obtunded to improved.  Stop all BP meds.  Stop metformin . Glycemic protocol with q4 h. Npo x 24 hours.  Pt accepted to med tele bed.

## 2024-01-28 NOTE — Progress Notes (Signed)
 Speech Language Pathology Daily Session Note  Patient Details  Name: John [name] MRN: [id number] Date of Birth: 1945-12-18  Today's Date: 01/28/2024 SLP Individual Time: 0800-0820 SLP Individual Time Calculation (min): 20 min  Short Term Goals: Week 1: SLP Short Term Goal 1 (Week 1): Patient will utilize WRAP memory strategies to recall daily information given maxA SLP Short Term Goal 2 (Week 1): During speech therapy tasks, patient will sustain attention for intervals given maxA SLP Short Term Goal 3 (Week 1): Patient will orient to time using external aids given modA SLP Short Term Goal 4 (Week 1): Patient will state 2 cognitive deficits given maxA  Skilled Therapeutic Interventions: Skilled therapy session focused on cognitive goals. Upon entrance, patient asleep. SLP alerted patient via verbal and tactile cues. Patient lethargic, though with spontaneous eye opening. SLP attempted to target cognitive goals through interpretation of days therapy schedule. Patient required total A to verbalize names on schedule despite SLP increasing font size. Patient verbalized dont bother when SLP attempted to continue cognitive therapy and stated no when SLP asked if he would participate in further activities. Patient closed eyes and ceased participation in ST despite continued cues and encouragement. 40 minutes of ST missed due to patients refusal to participate. Patient left in bed with alarm set and call bell in reach. Continue POC  Pain None reported to SLP  Therapy/Group: Individual Therapy  Leeyah Heather M.A., CCC-SLP 01/28/2024, 7:38 AM

## 2024-01-28 NOTE — Progress Notes (Signed)
 EEG complete - results pending

## 2024-01-29 ENCOUNTER — Inpatient Hospital Stay (HOSPITAL_COMMUNITY)

## 2024-01-29 DIAGNOSIS — G9340 Encephalopathy, unspecified: Secondary | ICD-10-CM | POA: Diagnosis not present

## 2024-01-29 DIAGNOSIS — Z8673 Personal history of transient ischemic attack (TIA), and cerebral infarction without residual deficits: Secondary | ICD-10-CM | POA: Diagnosis not present

## 2024-01-29 DIAGNOSIS — K7460 Unspecified cirrhosis of liver: Secondary | ICD-10-CM | POA: Diagnosis not present

## 2024-01-29 DIAGNOSIS — I10 Essential (primary) hypertension: Secondary | ICD-10-CM | POA: Diagnosis not present

## 2024-01-29 DIAGNOSIS — Z794 Long term (current) use of insulin: Secondary | ICD-10-CM

## 2024-01-29 DIAGNOSIS — I5020 Unspecified systolic (congestive) heart failure: Secondary | ICD-10-CM | POA: Diagnosis not present

## 2024-01-29 DIAGNOSIS — N400 Enlarged prostate without lower urinary tract symptoms: Secondary | ICD-10-CM | POA: Diagnosis not present

## 2024-01-29 DIAGNOSIS — E1165 Type 2 diabetes mellitus with hyperglycemia: Secondary | ICD-10-CM

## 2024-01-29 DIAGNOSIS — K828 Other specified diseases of gallbladder: Secondary | ICD-10-CM | POA: Diagnosis not present

## 2024-01-29 LAB — BASIC METABOLIC PANEL WITH GFR
Anion gap: 11 (ref 5–15)
BUN: 26 mg/dL — ABNORMAL HIGH (ref 8–23)
CO2: 23 mmol/L (ref 22–32)
Calcium: 9.2 mg/dL (ref 8.9–10.3)
Chloride: 103 mmol/L (ref 98–111)
Creatinine, Ser: 0.91 mg/dL (ref 0.61–1.24)
GFR, Estimated: >60 mL/min (ref >60)
Glucose, Bld: 115 mg/dL — ABNORMAL HIGH (ref 70–99)
Potassium: 3.9 mmol/L (ref 3.5–5.1)
Sodium: 137 mmol/L (ref 135–145)

## 2024-01-29 LAB — GLUCOSE, CAPILLARY
Glucose-Capillary: 134 mg/dL — ABNORMAL HIGH (ref 70–99)
Glucose-Capillary: 139 mg/dL — ABNORMAL HIGH (ref 70–99)
Glucose-Capillary: 146 mg/dL — ABNORMAL HIGH (ref 70–99)
Glucose-Capillary: 183 mg/dL — ABNORMAL HIGH (ref 70–99)

## 2024-01-29 LAB — CBC
HCT: 33.8 % — ABNORMAL LOW (ref 39.0–52.0)
Hemoglobin: 11.3 g/dL — ABNORMAL LOW (ref 13.0–17.0)
MCH: 29.8 pg (ref 26.0–34.0)
MCHC: 33.4 g/dL (ref 30.0–36.0)
MCV: 89.2 fL (ref 80.0–100.0)
Platelets: 254 K/uL (ref 150–400)
RBC: 3.79 MIL/uL — ABNORMAL LOW (ref 4.22–5.81)
RDW: 17.8 % — ABNORMAL HIGH (ref 11.5–15.5)
WBC: 11.2 K/uL — ABNORMAL HIGH (ref 4.0–10.5)
nRBC: 0 % (ref 0.0–0.2)

## 2024-01-29 LAB — LACTIC ACID, PLASMA
Lactic Acid, Venous: 2.2 mmol/L (ref 0.5–1.9)
Lactic Acid, Venous: 1.2 mmol/L (ref 0.5–1.9)

## 2024-01-29 LAB — RPR: RPR Ser Ql: NONREACTIVE

## 2024-01-29 LAB — URINE CULTURE: Culture: >=100000 — AB

## 2024-01-29 LAB — PROCALCITONIN
Procalcitonin: <0.1 ng/mL
Procalcitonin: <0.1 ng/mL

## 2024-01-29 LAB — AMMONIA: Ammonia: 37 umol/L — ABNORMAL HIGH (ref 9–35)

## 2024-01-29 LAB — HEPATIC FUNCTION PANEL
ALT: 33 U/L (ref 0–44)
AST: 26 U/L (ref 15–41)
Albumin: 3 g/dL — ABNORMAL LOW (ref 3.5–5.0)
Alkaline Phosphatase: 92 U/L (ref 38–126)
Bilirubin, Direct: 0.3 mg/dL — ABNORMAL HIGH (ref 0.0–0.2)
Indirect Bilirubin: 1.2 mg/dL — ABNORMAL HIGH (ref 0.3–0.9)
Total Bilirubin: 1.5 mg/dL — ABNORMAL HIGH (ref 0.0–1.2)
Total Protein: 5.7 g/dL — ABNORMAL LOW (ref 6.5–8.1)

## 2024-01-29 LAB — MAGNESIUM: Magnesium: 1.4 mg/dL — ABNORMAL LOW (ref 1.7–2.4)

## 2024-01-29 LAB — VITAMIN B12: Vitamin B-12: 497 pg/mL (ref 180–914)

## 2024-01-29 LAB — MRSA NEXT GEN BY PCR, NASAL: MRSA by PCR Next Gen: NOT DETECTED

## 2024-01-29 LAB — TSH: TSH: 1.073 u[IU]/mL (ref 0.350–4.500)

## 2024-01-29 MED ORDER — LACTULOSE 10 GM/15ML PO SOLN: 20.0000 g | Freq: Two times a day (BID) | ORAL | Status: DC | PRN

## 2024-01-29 MED ORDER — VANCOMYCIN HCL IN DEXTROSE 1-5 GM/200ML-% IV SOLN
1000.0000 mg | Freq: Two times a day (BID) | INTRAVENOUS | Status: DC
  Administered 2024-01-29: 1000 mg via INTRAVENOUS
  Filled 2024-01-29: qty 200

## 2024-01-29 MED ORDER — MAGNESIUM SULFATE 4 GM/100ML IV SOLN
4.0000 g | Freq: Once | INTRAVENOUS | Status: AC
  Administered 2024-01-29: 4 g via INTRAVENOUS
  Filled 2024-01-29: qty 100

## 2024-01-29 MED ORDER — QUETIAPINE FUMARATE 25 MG PO TABS
25.0000 mg | ORAL_TABLET | Freq: Every day | ORAL | Status: DC
Start: 2024-01-29 — End: 2024-02-03
  Administered 2024-01-29 – 2024-02-02 (×5): 25 mg via ORAL
  Filled 2024-01-29 (×5): qty 1

## 2024-01-29 MED ORDER — IOHEXOL 350 MG/ML SOLN
75.0000 mL | Freq: Once | INTRAVENOUS | Status: AC | PRN
  Administered 2024-01-29: 75 mL via INTRAVENOUS

## 2024-01-29 MED ORDER — INSULIN GLARGINE-YFGN 100 UNIT/ML ~~LOC~~ SOLN
5.0000 [IU] | Freq: Two times a day (BID) | SUBCUTANEOUS | Status: AC
  Administered 2024-01-29: 5 [IU] via SUBCUTANEOUS
  Filled 2024-01-29: qty 0.05

## 2024-01-29 MED ORDER — INSULIN GLARGINE-YFGN 100 UNIT/ML ~~LOC~~ SOLN
10.0000 [IU] | Freq: Every day | SUBCUTANEOUS | Status: DC
  Administered 2024-01-30 – 2024-02-01 (×3): 10 [IU] via SUBCUTANEOUS
  Filled 2024-01-29 (×3): qty 0.1

## 2024-01-29 MED ORDER — LACTULOSE 10 GM/15ML PO SOLN
20.0000 g | Freq: Two times a day (BID) | ORAL | Status: AC
  Administered 2024-01-29 (×2): 20 g via ORAL
  Filled 2024-01-29 (×2): qty 30

## 2024-01-29 NOTE — Progress Notes (Signed)
 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  Reportedly normal in the morning but BP dropped to 56/44 and became hypotensive in the afternoon.  BP improved after IV fluid and he started to begin to respond to pain stimuli.  Initially code stroke activated and he was evaluated by neurology who called off code stroke suspecting encephalopathy rather than CVA.  In ED, slightly tachycardic and tachypneic.  Slightly hyperglycemic with bicarb of 15 and anion gap of 20.  WBC 12.4.  Lactic acid 3.6.  Serial troponin and CK negative.  EKG with mild sinus tachycardia, LAD, RBBB and age-indeterminate lateral and inferior infarcts.  Foley catheter exchanged.  UA with moderate LE and few bacteria.  CXR and CT head without acute finding.  Patient was started on broad-spectrum antibiotics due to SIRS with lactic acidosis.  Cultures and CT abdomen and pelvis ordered.  CT suggested RLL bronchopneumonia with possible aspiration, T12 compression fracture with slight interval dorsal buckling and 25% height loss, early cirrhosis, clustered small gallstone with mild gallbladder dilation similar to prior studies, enlarged prostate and circumferentially thickened urinary bladder.   Subjective: Seen and examined earlier this morning.  No major events overnight or this morning.  Sleepy but awakes to voice.  He is oriented to self and place but not time.  Follows commands.  Denies pain, shortness of breath, nausea or vomiting but not a great historian.   Assessment and  plan: Acute metabolic encephalopathy: Likely due to sepsis from possible aspiration pneumonia and hypotension.  CT head without acute finding.  EEG suggested encephalopathy but no seizure.  Has some left-sided weakness likely from prior stroke.  Encephalopathy labs including TSH, ammonia, B12 and RPR unrevealing.  Encephalopathy seems to have improved.  He is sleepy but wakes to voice.  He is oriented to self, place and follows commands. -Reorientation and delirium precaution -Treat treatable causes  Severe sepsis due to aspiration pneumonia and possible cystitis: Present on admission.  Tachycardia, tachypnea, leukocytosis, lactic acidosis and hypotension on presentation.  CT concerning for RLL aspiration pneumonia and possible cystitis.  History of ESBL E. coli UTI.  UA with moderate LE and few bacteria.  Blood cultures NGTD.  MRSA PCR nonreactive.  Foley catheter exchanged on admission. -Continue IV meropenem.  Discontinue vancomycin . -Requested microbiology to add urine culture to urine collection on admission - Aspiration precaution - SLP eval  IDDM-2 with hyperglycemia: A1c 8.5% on 10/7. Recent Labs  Lab 01/28/24 1125 01/28/24 1328 01/28/24 1642 01/28/24 2226 01/29/24 0627  GLUCAP 147* 168* 155* 111* 134*  - Continue SSI very sensitive -Consolidate Semglee  from 5 units twice daily to 10 units daily starting tomorrow  Hx of CVA: Seems to have residual left-sided weakness.  CT head without acute finding. - Continue ASA, Plavix , Lipitor   -PT/OT   Chronic HFrEF: TTE in 11/2023 with LVEF of 30 to 35%.  Appears euvolemic on exam.  Not on diuretics - Monitor fluid and respiratory status.   Bladder outlet obstruction with chronic Foley -Foley catheter exchanged -Outpatient follow-up with urology   Hypotension: Resolved. -  Continue holding losartan    Cognitive impairment; depression; anxiety  -Continue Lexapro , use delirium precautions   HAGMA: Likely due to lactic acidosis versus  DKA.  Resolved.  Cholelithiasis-seems chronic.  Doubt cholecystitis.  No GI symptoms. - Outpatient follow-up  T12 compression fracture - Supportive care  Hypomagnesemia - Replenish and recheck  Body mass index is 24.82 kg/m.           DVT prophylaxis:  enoxaparin  (LOVENOX ) injection 40 mg Start: 01/29/24 1000  Code Status: DNR Family Communication: None at bedside Level of care: Progressive Status is: Inpatient Remains inpatient appropriate because: Severe sepsis due to possible aspiration pneumonia and UTI, encephalopathy and hypotension   Final disposition: To be determined   55 minutes with more than 50% spent in reviewing records, counseling patient/family and coordinating care.  Consultants:  None  Procedures: None  Microbiology summarized: Blood cultures NGTD Urine culture pending  Objective: Vitals:   01/28/24 2038 01/29/24 0101 01/29/24 0450 01/29/24 0759  BP: 124/63 (!) 97/55 (!) 155/74 (!) 126/58  Pulse: (!) 109 94 83 78  Resp: (!) 24 20 14 20   Temp: 99 F (37.2 C) 99.3 F (37.4 C) 97.8 F (36.6 C) 98.2 F (36.8 C)  TempSrc: Axillary Oral Oral Oral  SpO2: 98% 93% 100% 100%  Weight: 83 kg     Height: 6' (1.829 m)       Examination:  GENERAL: No apparent distress.  Nontoxic. HEENT: MMM.  Vision and hearing grossly intact.  NECK: Supple.  No apparent JVD.  RESP:  No IWOB.  Fair aeration bilaterally. CVS:  RRR. Heart sounds normal.  ABD/GI/GU: BS+. Abd soft, NTND.  Indwelling Foley.  Clear liquid urine. MSK/EXT:  Moves extremities. No apparent deformity. No edema.  SKIN: no apparent skin lesion or wound NEURO: Sleepy but wakes to voice.  Oriented to self and place.  Follows commands. Speech clear. Cranial nerves II-XII intact.  Motor 4/5 in LUE and 3+/5 in LLE.  5/5 in the right.  Light sensation intact in all dermatomes. Patellar reflex symmetric.   PSYCH: Calm. Normal affect.   Sch Meds:  Scheduled Meds:  aspirin  EC  81 mg Oral Daily    atorvastatin   80 mg Oral QHS   Chlorhexidine  Gluconate Cloth  6 each Topical Daily   clopidogrel   75 mg Oral Q breakfast   enoxaparin  (LOVENOX ) injection  40 mg Subcutaneous Q24H   escitalopram   10 mg Oral Daily   insulin  aspart  0-5 Units Subcutaneous QHS   insulin  aspart  0-6 Units Subcutaneous TID WC   insulin  glargine-yfgn  5 Units Subcutaneous BID   pantoprazole   40 mg Oral QHS   sodium chloride  flush  3 mL Intravenous Q12H   Continuous Infusions:  magnesium  sulfate bolus IVPB     meropenem (MERREM) IV 1 g (01/29/24 0605)   vancomycin      PRN Meds:.acetaminophen  **OR** acetaminophen , fentaNYL  (SUBLIMAZE ) injection, guaiFENesin , oxyCODONE , prochlorperazine , senna-docusate  Antimicrobials: Anti-infectives (From admission, onward)    Start     Dose/Rate Route Frequency Ordered Stop   01/29/24 2300  vancomycin  (VANCOREADY) IVPB 1500 mg/300 mL  Status:  Discontinued        1,500 mg 150 mL/hr over 120 Minutes Intravenous Every 24 hours 01/28/24 2132 01/29/24 0812   01/29/24 1100  vancomycin  (VANCOCIN ) IVPB 1000 mg/200 mL premix        1,000 mg 200 mL/hr over 60 Minutes Intravenous Every 12 hours 01/29/24 0812     01/28/24 2300  meropenem (MERREM) 1  g in sodium chloride  0.9 % 100 mL IVPB        1 g 200 mL/hr over 30 Minutes Intravenous Every 8 hours 01/28/24 2128     01/28/24 2200  vancomycin  (VANCOREADY) IVPB 1750 mg/350 mL        1,750 mg 175 mL/hr over 120 Minutes Intravenous  Once 01/28/24 2119 01/29/24 0152        I have personally reviewed the following labs and images: CBC: Recent Labs  Lab 01/25/24 0523 01/26/24 0516 01/28/24 1411 01/29/24 0256  WBC 8.1 7.4 12.4* 11.2*  NEUTROABS 4.8  --   --   --   HGB 12.4* 12.2* 12.6* 11.3*  HCT 36.7* 35.9* 39.1 33.8*  MCV 87.6 88.2 90.5 89.2  PLT 233 225 304 254   BMP &GFR Recent Labs  Lab 01/24/24 1341 01/25/24 0523 01/26/24 0516 01/28/24 1411 01/29/24 0256  NA  --  138 135 137 137  K  --  3.9 3.9 4.1 3.9   CL  --  102 101 102 103  CO2  --  27 23 15* 23  GLUCOSE 396* 174* 281* 154* 115*  BUN  --  24* 20 32* 26*  CREATININE  --  0.84 0.77 1.19 0.91  CALCIUM   --  9.9 9.4 9.7 9.2  MG  --   --   --   --  1.4*   Estimated Creatinine Clearance: 74.6 mL/min (by C-G formula based on SCr of 0.91 mg/dL). Liver & Pancreas: Recent Labs  Lab 01/25/24 0523 01/28/24 2329  AST 22 26  ALT 23 33  ALKPHOS 91 92  BILITOT 1.3* 1.5*  PROT 5.5* 5.7*  ALBUMIN 2.8* 3.0*   No results for input(s): LIPASE, AMYLASE in the last 168 hours. Recent Labs  Lab 01/29/24 0256  AMMONIA 37*   Diabetic: No results for input(s): HGBA1C in the last 72 hours. Recent Labs  Lab 01/28/24 1125 01/28/24 1328 01/28/24 1642 01/28/24 2226 01/29/24 0627  GLUCAP 147* 168* 155* 111* 134*   Cardiac Enzymes: Recent Labs  Lab 01/28/24 1411  CKTOTAL 44*  CKMB 3.1   Recent Labs    01/16/24 1659  PROBNP 465.0*   Coagulation Profile: Recent Labs  Lab 01/28/24 2329  INR 1.0   Thyroid  Function Tests: Recent Labs    01/29/24 0256  TSH 1.073   Lipid Profile: No results for input(s): CHOL, HDL, LDLCALC, TRIG, CHOLHDL, LDLDIRECT in the last 72 hours. Anemia Panel: Recent Labs    01/29/24 0256  VITAMINB12 497   Urine analysis:    Component Value Date/Time   COLORURINE YELLOW 01/28/2024 1845   APPEARANCEUR CLOUDY (A) 01/28/2024 1845   LABSPEC 1.020 01/28/2024 1845   PHURINE 5.0 01/28/2024 1845   GLUCOSEU NEGATIVE 01/28/2024 1845   HGBUR NEGATIVE 01/28/2024 1845   BILIRUBINUR NEGATIVE 01/28/2024 1845   KETONESUR NEGATIVE 01/28/2024 1845   PROTEINUR 30 (A) 01/28/2024 1845   NITRITE NEGATIVE 01/28/2024 1845   LEUKOCYTESUR MODERATE (A) 01/28/2024 1845   Sepsis Labs: Invalid input(s): PROCALCITONIN, LACTICIDVEN  Microbiology: Recent Results (from the past 240 hours)  Culture, blood (Routine X 2) w Reflex to ID Panel     Status: None (Preliminary result)   Collection Time:  01/28/24  6:13 PM   Specimen: BLOOD RIGHT HAND  Result Value Ref Range Status   Specimen Description BLOOD RIGHT HAND  Final   Special Requests   Final    BOTTLES DRAWN AEROBIC ONLY Blood Culture results may not be optimal due to  an inadequate volume of blood received in culture bottles   Culture   Final    NO GROWTH < 12 HOURS Performed at [facility], [address] [place], [place] [address]    Report Status PENDING  Incomplete  Culture, blood (Routine X 2) w Reflex to ID Panel     Status: None (Preliminary result)   Collection Time: 01/28/24  6:13 PM   Specimen: BLOOD LEFT HAND  Result Value Ref Range Status   Specimen Description BLOOD LEFT HAND  Final   Special Requests   Final    BOTTLES DRAWN AEROBIC ONLY Blood Culture results may not be optimal due to an inadequate volume of blood received in culture bottles   Culture   Final    NO GROWTH < 12 HOURS Performed at [facility], [address], [place], [place] [address]    Report Status PENDING  Incomplete  MRSA Next Gen by PCR, Nasal     Status: None   Collection Time: 01/28/24  9:19 PM   Specimen: Nasal Mucosa; Nasal Swab  Result Value Ref Range Status   MRSA by PCR Next Gen NOT DETECTED NOT DETECTED Final    Comment: (NOTE) The GeneXpert MRSA Assay (FDA approved for NASAL specimens only), is one component of a comprehensive MRSA colonization surveillance program. It is not intended to diagnose MRSA infection nor to guide or monitor treatment for MRSA infections. Test performance is not FDA approved in patients less than 21 years old. Performed at [facility], [address], [place], [place] [address]   Culture, blood (x 2)     Status: None (Preliminary result)   Collection Time: 01/28/24 11:29 PM   Specimen: BLOOD RIGHT HAND  Result Value Ref Range Status   Specimen Description BLOOD RIGHT HAND  Final   Special Requests   Final    BOTTLES DRAWN AEROBIC ONLY Blood Culture results may not be  optimal due to an inadequate volume of blood received in culture bottles   Culture   Final    NO GROWTH < 12 HOURS Performed at [facility], [address] [place], [place] [address]    Report Status PENDING  Incomplete  Culture, blood (x 2)     Status: None (Preliminary result)   Collection Time: 01/28/24 11:29 PM   Specimen: BLOOD RIGHT ARM  Result Value Ref Range Status   Specimen Description BLOOD RIGHT ARM  Final   Special Requests   Final    BOTTLES DRAWN AEROBIC ONLY Blood Culture results may not be optimal due to an inadequate volume of blood received in culture bottles   Culture   Final    NO GROWTH < 12 HOURS Performed at [facility], [address] [place], [place] [address]    Report Status PENDING  Incomplete    Radiology Studies: CT ABDOMEN PELVIS W CONTRAST Result Date: 01/29/2024 EXAM: CT ABDOMEN AND PELVIS WITH CONTRAST 01/29/2024 01:48:25 AM TECHNIQUE: CT of the abdomen and pelvis was performed with the administration of 75 mL of iohexol  (OMNIPAQUE ) 350 MG/ML injection. Multiplanar reformatted images are provided for review. Automated exposure control, iterative reconstruction, and/or weight-based adjustment of the mA/kV was utilized to reduce the radiation dose to as low as reasonably achievable. COMPARISON: CT with iv contrast 12/26/2023 and 12/19/2023. CLINICAL HISTORY: Abdominal pain, acute, nonlocalized; Sepsis. FINDINGS: LOWER CHEST: The cardiac size is normal. There are pacemaker wires in the right heart. There are 3-vessel  coronary calcifications and minimal anterior pericardial effusion. In the right lower lobe, there is interval increased bronchial thickening and patchy airspace consolidation much of which is peribronchovascular, consistent with bronchopneumonia with an aspiration component possible. The remaining lung bases are clear. No pneumothorax. LIVER: The liver is 20 cm in length and mildly steatotic with capsular nodularity over the liver  undersurface compatible with early cirrhosis. The hepatic portal vein is normal caliber. There is no liver mass. GALLBLADDER AND BILE DUCTS: Clustered small stones in the proximal gallbladder. Mild gallbladder dilatation similar to prior studies. No wall thickening or ductal dilatation. SPLEEN: Calcified granulomas within a normal sized spleen. No mass. PANCREAS: Partial fatty atrophy in the head, uncinate process, and neck of the pancreas. No pancreatic mass or ductal dilatation. ADRENAL GLANDS: No acute abnormality. KIDNEYS, URETERS AND BLADDER: No stones in the kidneys or ureters. No hydronephrosis. No renal mass enhancement. No perinephric or periureteral stranding. Urinary bladder is catheterized and again is noted circumferentially thickened which is typically either due to cystitis or hypertrophy. [name] GI AND BOWEL: Stomach demonstrates no acute abnormality. There is no bowel obstruction. PERITONEUM AND RETROPERITONEUM: Minimal ascites in the perihepatic and presacral spaces is unchanged. No free air. VASCULATURE: Aorta is normal in caliber. Patchy aortoiliac calcific plaque without aneurysm or dissection. LYMPH NODES: No lymphadenopathy. REPRODUCTIVE ORGANS: The prostate is enlarged, measuring 5 cm with peripheral calcifications on the left. BONES AND SOFT TISSUES: Osteopenia. Superior endplate anterior wedge compression fracture of the T12 vertebral body is again noted; there is slight interval dorsal buckling of the posterior superior cortex and there is increased anterior vertebral height loss, was 20% now 25%. This was first seen on 12/26/2023 and no new compression fracture is evident. Mild features of bilateral hip arthrosis. . Bridging osteophytes over the anterior left SI joint. No focal soft tissue abnormality. IMPRESSION: 1. Right lower lobe bronchopneumonia with possible aspiration component. 2. Superior endplate anterior wedge compression fracture of T12 with slight interval dorsal buckling  superiorly and increased anterior vertebral height loss, now 25 percent. No new compression fracture. 3. Early cirrhosis with mild hepatic steatosis. No liver mass. 4. Clustered small gallstones with mild gallbladder dilatation, similar to prior studies. No wall thickening or ductal dilatation. 5. Enlarged prostate with peripheral calcifications on the left. 6. Circumferentially thickened bladder most likely either due to cystitis or hypertrophy, unchanged. 7. Aortic atherosclerosis. Coronary atherosclerosis. Electronically signed by: Francis Quam MD 01/29/2024 02:19 AM EDT RP Workstation: [id number]   DG CHEST PORT 1 VIEW Result Date: 01/28/2024 EXAM: 1 VIEW(S) XRAY OF THE CHEST 01/28/2024 06:32:00 PM COMPARISON: 01/16/2024 CLINICAL HISTORY: SOB (shortness of breath) [id number]. Reason for exam: SHOB FINDINGS: LINES, TUBES AND DEVICES: Left chest cardiac pacing device in place. LUNGS AND PLEURA: Low lung volumes. No focal pulmonary opacity. No pulmonary edema. No pleural effusion. No pneumothorax. HEART AND MEDIASTINUM: No acute abnormality of the cardiac and mediastinal silhouettes. BONES AND SOFT TISSUES: No acute osseous abnormality. IMPRESSION: 1. No acute cardiopulmonary process. Electronically signed by: Norman Gatlin MD 01/28/2024 06:40 PM EDT RP Workstation: [id number]   EEG adult Result Date: 01/28/2024 Shelton Arlin KIDD, MD     01/28/2024  5:46 PM Patient Name: Quintan Saldivar MRN: [id number] Epilepsy Attending: Arlin KIDD Shelton Referring Physician/Provider: Sallyann Normie HERO, MD Date: 01/28/2024 Duration: 22.29 mins Patient history: 78yo M with ams. EEG to evaluate for seizure Level of alertness: Awake AEDs during EEG study: None Technical aspects: This EEG study was done with scalp electrodes positioned according to  the 10-20 International system of electrode placement. Electrical activity was reviewed with band pass filter of 1-70Hz , sensitivity of 7 uV/mm, display speed of 21mm/sec with a 60Hz  notched  filter applied as appropriate. EEG data were recorded continuously and digitally stored.  Video monitoring was available and reviewed as appropriate. Description:  EEG showed continuous generalized 3 to 6 Hz theta-delta slowing. Hyperventilation and photic stimulation were not performed.   ABNORMALITY - Continuous slow, generalized IMPRESSION: This study is suggestive of generalized cerebral dysfunction (encephalopathy). No seizures or epileptiform discharges were seen throughout the recording. Arlin MALVA Krebs   CT HEAD CODE STROKE WO CONTRAST Result Date: 01/28/2024 EXAM: CT HEAD WITHOUT CONTRAST 01/28/2024 01:56:04 PM TECHNIQUE: CT of the head was performed without the administration of intravenous contrast. Automated exposure control, iterative reconstruction, and/or weight based adjustment of the mA/kV was utilized to reduce the radiation dose to as low as reasonably achievable. COMPARISON: CT head 01/16/2024 and CTA head and neck 01/19/2024. CLINICAL HISTORY: Neuro deficit, acute, stroke suspected. FINDINGS: BRAIN AND VENTRICLES: No acute hemorrhage. No evidence of acute infarct. Nonspecific hypoattenuation in the periventricular and subcortical white matter, most likely representing chronic microvascular ischemic changes. Remote lacunar infarcts in the bilateral thalami and bilateral basal ganglia. Atherosclerosis of the carotid siphons and intracranial vertebral arteries. No hydrocephalus. No extra-axial collection. No mass effect or midline shift. ORBITS: Bilateral lens replacement. No acute abnormality. SINUSES: No acute abnormality. SOFT TISSUES AND SKULL: No acute soft tissue abnormality. No skull fracture. [place] stroke program early CT (aspect) score: Ganglionic (caudate, ic, lentiform nucleus, insula, M1-m3): 7 Supraganglionic (m4-m6): 3 Total: 10 IMPRESSION: 1. No acute intracranial abnormality. 2. Chronic microvascular ischemic changes. 3. Remote lacunar infarcts in the bilateral thalami and  basal ganglia. 4. Findings messaged to Dr. Sallyann at 2:02PM on 01/28/24. Electronically signed by: Donnice Mania MD 01/28/2024 02:02 PM EDT RP Workstation: [id number]      Ujbz T. Jiayi Lengacher [facility]  If 7PM-7AM, please contact night-coverage [email or web address] 01/29/2024, 12:40 PM

## 2024-01-29 NOTE — Progress Notes (Signed)
 Pharmacy Antibiotic Note  John [name] is a 78 y.o. male admitted on 01/28/2024 with sepsis.  Pharmacy has been consulted for Vancomycin  and Meropenem dosing.  Plan: Based on improved renal function, increase Vancomycin  to 1000 mg IV Q12H (Scr used: 0.91, Vd used: 0.72, eAUC: 504.6)  Continue Meropenem 1 g IV Q 8H Collect vancomycin  level as clinically indicated Monitor renal function and clinical status  Height: 6' (182.9 cm) Weight: 83 kg (182 lb 15.7 oz) IBW/kg (Calculated) : 77.6  Temp (24hrs), Avg:98.4 F (36.9 C), Min:97.5 F (36.4 C), Max:99.3 F (37.4 C)  Recent Labs  Lab 01/25/24 0523 01/26/24 0516 01/28/24 1411 01/28/24 1813 01/28/24 2329 01/29/24 0256  WBC 8.1 7.4 12.4*  --   --  11.2*  CREATININE 0.84 0.77 1.19  --   --  0.91  LATICACIDVEN  --   --   --  3.6* 2.2* 1.2    Estimated Creatinine Clearance: 74.6 mL/min (by C-G formula based on SCr of 0.91 mg/dL).    Allergies  Allergen Reactions   Ramipril Anaphylaxis   Adhesive [Tape] Rash   Other Diarrhea    Severe intolerance to Chemotherapy in the past.   Sertraline Other (See Comments)    Extreme headaches    Antimicrobials this admission: Vancomycin  10/18 >>  Meropenem 10/18 >>    Microbiology results: 10/18 BCx: in progress 10/18 MRSA PCR: negative  Thank you for allowing pharmacy to be involved with this patient's care.  Mendel Barter, PharmD PGY1 Clinical Pharmacist [facility]  01/29/2024 8:10 AM

## 2024-01-29 NOTE — Evaluation (Signed)
 Clinical/Bedside Swallow Evaluation Patient Details  Name: John [name] MRN: [id number] Date of Birth: 17-Sep-1945  Today's Date: 01/29/2024 Time: SLP Start Time (ACUTE ONLY): 1426 SLP Stop Time (ACUTE ONLY): 1440 SLP Time Calculation (min) (ACUTE ONLY): 14 min  Past Medical History:  Past Medical History:  Diagnosis Date   BPH (benign prostatic hyperplasia)    CTS (carpal tunnel syndrome)    Depression    Diabetes mellitus    Essential hypertension    GERD (gastroesophageal reflux disease)    History of breast cancer    Hypercholesteremia    Neuropathy    Stroke [facility])    Past Surgical History:  Past Surgical History:  Procedure Laterality Date   CATARACT EXTRACTION, BILATERAL  01/18/2017   CORONARY STENT INTERVENTION N/A 08/10/2023   Procedure: CORONARY STENT INTERVENTION;  Surgeon: Darron Deatrice LABOR, MD;  Location: [facility] INVASIVE CV LAB;  Service: Cardiovascular;  Laterality: N/A;   LEFT HEART CATH AND CORONARY ANGIOGRAPHY N/A 08/10/2023   Procedure: LEFT HEART CATH AND CORONARY ANGIOGRAPHY;  Surgeon: Darron Deatrice LABOR, MD;  Location: [facility] INVASIVE CV LAB;  Service: Cardiovascular;  Laterality: N/A;   LITHOTRIPSY     MASTECTOMY Right 05/02/2018   PACEMAKER IMPLANT N/A 05/13/2023   Procedure: PACEMAKER IMPLANT;  Surgeon: Kennyth Chew, MD;  Location: [facility] INVASIVE CV LAB;  Service: Cardiovascular;  Laterality: N/A;   TEMPORARY PACEMAKER N/A 05/11/2023   Procedure: TEMPORARY PACEMAKER;  Surgeon: Swaziland, Peter M, MD;  Location: [facility] INVASIVE CV LAB;  Service: Cardiovascular;  Laterality: N/A;   TONSILLECTOMY AND ADENOIDECTOMY     TRANSESOPHAGEAL ECHOCARDIOGRAM (CATH LAB) N/A 12/08/2023   Procedure: TRANSESOPHAGEAL ECHOCARDIOGRAM;  Surgeon: Raford Riggs, MD;  Location: [facility] INVASIVE CV LAB;  Service: Cardiovascular;  Laterality: N/A;   HPI:  John [name] is a 78 y.o. 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 Pt transferred from  inpatient CIR due to decreased responsiveness. Code stroke called, evaluated by neurologist who canceled code stroke suspecting encephalopathy rarther than CVA. CT no acute intracranial abnormality,   chronic microvascular ischemic changes, remote lacunar infarcts in the bilateral thalami and basal ganglia.  Swallow briefly screened on CIR- no changes endorsed by pt not overt s/s difficulty with meal during consumption. Chest CT Right lower lobe bronchopneumonia with possible aspiration component. Wife states was told he had pna on prior admission for sepsis (could not recall when). 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    Assessment / Plan / Recommendation  Clinical Impression  Pt and wife deny recent difficulty coughing or other signs of pharyngeal dysphagia. Oromotor exam unremarkable with strong volitional cough and is missing several posterior dentition. He refused the applesauce but consumed cracker with timely mastication and complete clearance. There were no overt indications of decreased airway protection with multiple straw sips thin. He did have one mild delayed cough at end of evaluation. Given location of stroke (pontine), finding of right lower lobe bronchopneumonia with possible aspiration component and history of pna an MBS is recommended. Discussed results with pt who declined MBS even after further explanation and wife encouraging pt. He was willing for ST to return to further discuss and determine his willingness to participate. Recommend continuing regular texture, thin liquids, pills with thin and upright position and ST will follow. SLP Visit Diagnosis: Dysphagia, unspecified (R13.10)  Aspiration Risk  Mild aspiration risk;Moderate aspiration risk    Diet Recommendation  Regular;Thin liquid    Liquid Administration via: Cup;Straw Medication Administration: Whole meds with liquid Supervision: Patient able to self feed;Full supervision/cueing for compensatory strategies Compensations: Slow rate;Small sips/bites Postural Changes: Seated upright at 90 degrees    Other  Recommendations Oral Care Recommendations: Oral care BID     Assistance Recommended at Discharge    Functional Status Assessment Patient has had a recent decline in their functional status and demonstrates the ability to make significant improvements in function in a reasonable and predictable amount of time.  Frequency and Duration min 2x/week  2 weeks       Prognosis Prognosis for improved oropharyngeal function: Good Barriers to Reach Goals: Cognitive deficits      Swallow Study   General Date of Onset: 01/29/24 HPI: John [name] is a 78 y.o. 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 Pt transferred from inpatient CIR due to decreased responsiveness. Code stroke called, evaluated by neurologist who canceled code stroke suspecting encephalopathy rarther than CVA. CT no acute intracranial abnormality,   chronic microvascular ischemic changes, remote lacunar infarcts in the bilateral thalami and basal ganglia.  Swallow briefly screened on CIR- no changes endorsed by pt not overt s/s difficulty with meal during consumption. Chest CT Right lower lobe bronchopneumonia with possible aspiration component. Wife states was told he had pna on prior admission for sepsis (could not recall when). 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 Type of Study: Bedside Swallow Evaluation Previous Swallow Assessment:  (see HPI) Diet Prior to this Study: Regular;Thin  liquids (Level 0) Temperature Spikes Noted: No Respiratory Status: Nasal cannula History of Recent Intubation: No Behavior/Cognition: Alert;Cooperative;Requires cueing Oral Cavity Assessment: Within Functional Limits Oral Care Completed by SLP: No Oral Cavity - Dentition: Other (Comment) (majority intact, missing posterior, bilateral (?)) Vision: Functional for self-feeding Self-Feeding Abilities: Able to feed self Patient Positioning: Upright in bed Baseline Vocal Quality: Normal Volitional Cough: Strong    Oral/Motor/Sensory Function Overall Oral Motor/Sensory Function: Within functional limits   Ice Chips Ice chips: Not tested   Thin Liquid Thin Liquid: Impaired Presentation: Straw Oral Phase Impairments:  (none) Oral Phase Functional Implications:  (none) Pharyngeal  Phase Impairments: Cough - Delayed (at end of assessment)    Nectar Thick Nectar Thick Liquid: Not tested   Honey Thick Honey Thick Liquid: Not tested   Puree Puree: Not tested (pt refused)   Solid     Solid: Within functional limits      Dustin Olam Bull 01/29/2024,3:17 PM

## 2024-01-30 DIAGNOSIS — Z515 Encounter for palliative care: Secondary | ICD-10-CM

## 2024-01-30 DIAGNOSIS — I5020 Unspecified systolic (congestive) heart failure: Secondary | ICD-10-CM | POA: Diagnosis not present

## 2024-01-30 DIAGNOSIS — R404 Transient alteration of awareness: Secondary | ICD-10-CM

## 2024-01-30 DIAGNOSIS — I10 Essential (primary) hypertension: Secondary | ICD-10-CM | POA: Diagnosis not present

## 2024-01-30 DIAGNOSIS — Z8673 Personal history of transient ischemic attack (TIA), and cerebral infarction without residual deficits: Secondary | ICD-10-CM | POA: Diagnosis not present

## 2024-01-30 DIAGNOSIS — G9340 Encephalopathy, unspecified: Secondary | ICD-10-CM | POA: Diagnosis not present

## 2024-01-30 LAB — CBC
HCT: 32.1 % — ABNORMAL LOW (ref 39.0–52.0)
Hemoglobin: 10.8 g/dL — ABNORMAL LOW (ref 13.0–17.0)
MCH: 30 pg (ref 26.0–34.0)
MCHC: 33.6 g/dL (ref 30.0–36.0)
MCV: 89.2 fL (ref 80.0–100.0)
Platelets: 228 K/uL (ref 150–400)
RBC: 3.6 MIL/uL — ABNORMAL LOW (ref 4.22–5.81)
RDW: 17.5 % — ABNORMAL HIGH (ref 11.5–15.5)
WBC: 6 K/uL (ref 4.0–10.5)
nRBC: 0 % (ref 0.0–0.2)

## 2024-01-30 LAB — COMPREHENSIVE METABOLIC PANEL WITH GFR
ALT: 28 U/L (ref 0–44)
AST: 26 U/L (ref 15–41)
Albumin: 2.4 g/dL — ABNORMAL LOW (ref 3.5–5.0)
Alkaline Phosphatase: 75 U/L (ref 38–126)
Anion gap: 8 (ref 5–15)
BUN: 19 mg/dL (ref 8–23)
CO2: 24 mmol/L (ref 22–32)
Calcium: 8.9 mg/dL (ref 8.9–10.3)
Chloride: 105 mmol/L (ref 98–111)
Creatinine, Ser: 0.76 mg/dL (ref 0.61–1.24)
GFR, Estimated: >60 mL/min (ref >60)
Glucose, Bld: 236 mg/dL — ABNORMAL HIGH (ref 70–99)
Potassium: 3.2 mmol/L — ABNORMAL LOW (ref 3.5–5.1)
Sodium: 137 mmol/L (ref 135–145)
Total Bilirubin: 1.3 mg/dL — ABNORMAL HIGH (ref 0.0–1.2)
Total Protein: 4.7 g/dL — ABNORMAL LOW (ref 6.5–8.1)

## 2024-01-30 LAB — URINE CULTURE: Culture: 60000 — AB

## 2024-01-30 LAB — MAGNESIUM: Magnesium: 2 mg/dL (ref 1.7–2.4)

## 2024-01-30 LAB — GLUCOSE, CAPILLARY
Glucose-Capillary: 188 mg/dL — ABNORMAL HIGH (ref 70–99)
Glucose-Capillary: 265 mg/dL — ABNORMAL HIGH (ref 70–99)
Glucose-Capillary: 251 mg/dL — ABNORMAL HIGH (ref 70–99)
Glucose-Capillary: 198 mg/dL — ABNORMAL HIGH (ref 70–99)

## 2024-01-30 MED ORDER — INSULIN ASPART 100 UNIT/ML IJ SOLN
0.0000 [IU] | Freq: Three times a day (TID) | INTRAMUSCULAR | Status: DC
  Administered 2024-01-30: 5 [IU] via SUBCUTANEOUS
  Administered 2024-01-31: 2 [IU] via SUBCUTANEOUS
  Administered 2024-01-31 – 2024-02-01 (×2): 3 [IU] via SUBCUTANEOUS
  Administered 2024-02-01: 2 [IU] via SUBCUTANEOUS
  Administered 2024-02-01: 7 [IU] via SUBCUTANEOUS
  Administered 2024-02-02: 3 [IU] via SUBCUTANEOUS
  Administered 2024-02-02 (×2): 2 [IU] via SUBCUTANEOUS
  Administered 2024-02-03: 1 [IU] via SUBCUTANEOUS
  Administered 2024-02-03: 3 [IU] via SUBCUTANEOUS

## 2024-01-30 MED ORDER — POTASSIUM CHLORIDE CRYS ER 20 MEQ PO TBCR
40.0000 meq | EXTENDED_RELEASE_TABLET | ORAL | Status: AC
  Administered 2024-01-30 (×2): 40 meq via ORAL
  Filled 2024-01-30 (×2): qty 2

## 2024-01-30 MED ORDER — AMOXICILLIN-POT CLAVULANATE 875-125 MG PO TABS
1.0000 | ORAL_TABLET | Freq: Two times a day (BID) | ORAL | Status: DC
  Administered 2024-01-30 – 2024-02-03 (×9): 1 via ORAL
  Filled 2024-01-30 (×11): qty 1

## 2024-01-30 MED ORDER — FENTANYL CITRATE (PF) 50 MCG/ML IJ SOSY
12.5000 ug | PREFILLED_SYRINGE | INTRAMUSCULAR | Status: DC | PRN
Start: 2024-01-30 — End: 2024-02-03

## 2024-01-30 NOTE — Progress Notes (Signed)
 PT Cancellation Note  Patient Details Name: John [name] MRN: [id number] DOB: 1945-06-08   Cancelled Treatment:    Reason Eval/Treat Not Completed: Other (comment) (eating)   Analeah Brame B Breydan [place] 01/30/2024, 8:59 AM Lenoard SQUIBB, PT Acute Rehabilitation Services Office: [phone]

## 2024-01-30 NOTE — Progress Notes (Signed)
 Patients wife requested to speak with MD, Dr Kathrin notified\

## 2024-01-30 NOTE — Progress Notes (Signed)
 Inpatient Rehabilitation Care Coordinator Discharge Note   Patient Details  Name: John [name] MRN: [id number] Date of Birth: [birth year]   Discharge location: D/c to acute  Length of Stay: 4 days  Discharge activity level:    Home/community participation:    Patient response un:Yzjouy Literacy - How often do you need to have someone help you when you read instructions, pamphlets, or other written material from your doctor or pharmacy?: Never  Patient response un:Dnrpjo Isolation - How often do you feel lonely or isolated from those around you?: Patient unable to respond  Services provided included: MD, RD, PT, OT, SLP, RN, Pharmacy, Neuropsych, SW, TR, CM  Financial Services:  Field seismologist Utilized: Private [employer]  Choices offered to/list presented to: N/John  Follow-up services arranged:              Patient response to transportation need: Is the patient able to respond to transportation needs?: Yes In the past 12 months, has lack of transportation kept you from medical appointments or from getting medications?: No In the past 12 months, has lack of transportation kept you from meetings, work, or from getting things needed for daily living?: No   Patient/Family verbalized understanding of follow-up arrangements:  Yes  Individual responsible for coordination of the follow-up plan: contact pt wife  Confirmed correct DME delivered: John [name] [name] [name] [name]    Comments (or additional information):    John [name]

## 2024-01-30 NOTE — Progress Notes (Signed)
 Speech Language Pathology Treatment: Dysphagia  Patient Details Name: John [name] MRN: [id number] DOB: 06/12/1945 Today's Date: 01/30/2024 Time: 9070-9057 SLP Time Calculation (min) (ACUTE ONLY): 13 min  Assessment / Plan / Recommendation Clinical Impression  Pt denies any trouble swallowing, even when asked about specific symptoms. However, immediate coughing was noted after initial sip of thin liquid and pt then reports that he has been experiencing coughing after swallowing. Mastication is a little prolonged, and he lets pieces of cracker hang out of his mouth as he is chewing the parts in his mouth, but does not overfill his oral cavity. No further overt signs of aspiration were noted, but pt also cannot be challenged with larger sips. He is self-limiting due to fear of choking. Despite pt acknowledging signs of possible dysphagia today, he still declines MBS to better evaluate because he says it is too dangerous. Despite education and encouragement from SLP, he continues to say no and also says that SLP cannot call his wife to discuss further. MD notified. SLP will be available if pt is agreeable to attempt MBS.    HPI HPI: [name] is a 78 y.o. 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 Pt transferred from inpatient CIR due to decreased responsiveness. Code stroke called, evaluated by neurologist who canceled code stroke suspecting encephalopathy rarther than CVA. CT no acute intracranial abnormality,   chronic microvascular ischemic changes, remote lacunar infarcts in the bilateral thalami and basal ganglia.  Swallow briefly screened on CIR- no changes endorsed by pt not overt s/s difficulty with meal during consumption. Chest CT Right lower lobe bronchopneumonia with possible aspiration component. Wife states was told he had pna on prior admission for sepsis (could not recall when). PMH: hypertension, hyperlipidemia, insulin -dependent  diabetes mellitus, history of CVA, CAD, heart block with pacer, chronic HFrEF, history of DVT no longer anticoagulated, bladder outlet obstruction with Foley catheter, cognitive deficits.      SLP Plan  Continue with current plan of care          Recommendations  Diet recommendations: Regular;Thin liquid Liquids provided via: Cup;Straw Medication Administration: Whole meds with liquid Supervision: Patient able to self feed;Full supervision/cueing for compensatory strategies Compensations: Slow rate;Small sips/bites Postural Changes and/or Swallow Maneuvers: Seated upright 90 degrees;Upright 30-60 min after meal                  Oral care BID     Dysphagia, unspecified (R13.10)     Continue with current plan of care     Leita SAILOR., M.A. CCC-SLP Acute Rehabilitation Services Office: [phone]  Secure chat preferred   01/30/2024, 9:51 AM

## 2024-01-30 NOTE — Evaluation (Signed)
 Physical Therapy Evaluation Patient Details Name: John [name] MRN: [id number] DOB: June 26, 1945 Today's Date: 01/30/2024  History of Present Illness  78 y.o. male adm 01/28/24 from [facility] hypotensive and unresponsive with RLL PNA, encephalopathy. PMHx: 10/6-10/14 admission for Lt weakness with Rt pontine infarct.T12 comp fx, HTN, HLD, T2DM, CVA, cognitive deficits, CAD, heart block s/p PPM, HFrEF, DVT, BPH, urinary retention with Foley catheter  Clinical Impression  Pt with flat affect, decreased cognition, impaired balance and safety, decreased strength and function. Pt currently requires max assist for bed level mobility with lack of awareness for deficits. Pt will benefit from acute therapy to maximize mobility, safety and function. Patient will benefit from continued inpatient follow up therapy, <3 hours/day         If plan is discharge home, recommend the following: A lot of help with bathing/dressing/bathroom;Assistance with cooking/housework;Help with stairs or ramp for entrance;Assist for transportation;Supervision due to cognitive status;Direct supervision/assist for medications management;Direct supervision/assist for financial management;Two people to help with walking and/or transfers   Can travel by private vehicle   No    Equipment Recommendations Wheelchair cushion (measurements PT);Hospital bed;Wheelchair (measurements PT);Hoyer lift  Recommendations for [employer]       Functional Status Assessment Patient has had a recent decline in their functional status and demonstrates the ability to make significant improvements in function in a reasonable and predictable amount of time.     Precautions / Restrictions Precautions Precautions: Fall;Back Precaution Booklet Issued: No Recall of Precautions/Restrictions: Impaired Precaution/Restrictions Comments: L hemiparesis      Mobility  Bed Mobility Overal bed mobility: Needs Assistance Bed Mobility: Rolling, Supine  to Sit, Sit to Supine Rolling: Min assist Sidelying to sit: Max assist, Used rails   Sit to supine: Max assist   General bed mobility comments: min assist to roll bil for pericare and pad change as pt incontinent of stool and unaware. Mod assist to clear legs from EOB and max assist to elevate trunk, pt with maintained posterior Rt bias with flexed trunk and max assist to achieve partial sitting. Could not achieve fully upright over 3 min with only +1 assist. return to bed max assist to lift legs to surface. min assist to slide to [facility] in trendelenburg    Transfers                   General transfer comment: unable to attempt this date without +2 assist    Ambulation/Gait                  Stairs            Wheelchair Mobility     Tilt Bed    Modified Rankin (Stroke Patients Only)       Balance Overall balance assessment: Needs assistance Sitting-balance support: Feet supported, Bilateral upper extremity supported Sitting balance-Leahy Scale: Poor Sitting balance - Comments: Posterior and right lateral lean                                     Pertinent Vitals/Pain Pain Assessment Pain Assessment: 0-10 Pain Score: 4  Pain Location: back Pain Descriptors / Indicators: Aching, Discomfort Pain Intervention(s): Limited activity within patient's tolerance, Monitored during session, Repositioned    Home Living Family/patient expects to be discharged to:: Skilled nursing facility Living Arrangements: Spouse/significant other Available Help at Discharge: Family Type of Home: House Home Access: Stairs to enter Entrance Stairs-Rails:  Can reach both Entrance Stairs-Number of Steps: 4 Alternate Level Stairs-Number of Steps: He reported sleeping in a recliner on the main level of the home. All bedrooms are upstairs Home Layout: Able to live on main level with bedroom/bathroom;Multi-level Home Equipment: Rolling Walker (2 wheels);Cane - single  point;BSC/3in1;Wheelchair - power Additional Comments: DC from SNF 10/6 after tx for comp fx and admitted with CVA same date    Prior Function Prior Level of Function : Needs assist             Mobility Comments: prior to last admission was walking 20' with RW. since CVA and recent admission max +2 to stand and using TIS WC       Extremity/Trunk Assessment   Upper Extremity Assessment RUE Deficits / Details: AROM WFL. Gross strength 4+/5. LUE Deficits / Details: prefers to use RUE, reduced dexterity and general strength in entire LUE    Lower Extremity Assessment LLE Deficits / Details: slow movements, decreased control LLE Coordination: decreased gross motor    Cervical / Trunk Assessment Cervical / Trunk Assessment: Kyphotic;Other exceptions Cervical / Trunk Exceptions: T12 comp fx hx, back pain, kyphotic with rt lean in sitting  Communication   Communication Communication: Impaired Factors Affecting Communication: Hearing impaired    Cognition Arousal: Alert Behavior During Therapy: Flat affect   PT - Cognitive impairments: Awareness, Memory, Attention, Initiation, Sequencing, Problem solving, Safety/Judgement, Orientation   Orientation impairments: Situation, Time, Place                   PT - Cognition Comments: pt would not state his name, oriented to month, stating he was in the parking lot of rehab, decreased awareness of deficits and assist, inconsistent command following Following commands: Impaired Following commands impaired: Follows one step commands inconsistently, Follows one step commands with increased time     Cueing Cueing Techniques: Verbal cues, Tactile cues, Visual cues     General Comments      Exercises     Assessment/Plan    PT Assessment Patient needs continued PT services  PT Problem List Decreased strength;Decreased cognition;Decreased knowledge of use of DME;Decreased activity tolerance;Decreased balance;Decreased  mobility;Decreased knowledge of precautions;Pain;Decreased coordination;Impaired sensation;Decreased safety awareness       PT Treatment Interventions DME instruction;Gait training;Functional mobility training;Therapeutic activities;Therapeutic exercise;Balance training;Neuromuscular re-education;Cognitive remediation;Patient/family education;Wheelchair mobility training    PT Goals (Current goals can be found in the Care Plan section)  Acute Rehab PT Goals PT Goal Formulation: Patient unable to participate in goal setting Time For Goal Achievement: 02/13/24 Potential to Achieve Goals: Fair    Frequency Min 2X/week     Co-evaluation               AM-PAC PT 6 Clicks Mobility  Outcome Measure Help needed turning from your back to your side while in a flat bed without using bedrails?: A Little Help needed moving from lying on your back to sitting on the side of a flat bed without using bedrails?: Total Help needed moving to and from a bed to a chair (including a wheelchair)?: Total Help needed standing up from a chair using your arms (e.g., wheelchair or bedside chair)?: Total Help needed to walk in hospital room?: Total Help needed climbing 3-5 steps with a railing? : Total 6 Click Score: 8    End of Session   Activity Tolerance: Patient tolerated treatment well Patient left: with call bell/phone within reach;in bed;with bed alarm set Nurse Communication: Mobility status;Need for lift equipment PT  Visit Diagnosis: Unsteadiness on feet (R26.81);Muscle weakness (generalized) (M62.81);Other abnormalities of gait and mobility (R26.89);Hemiplegia and hemiparesis;Difficulty in walking, not elsewhere classified (R26.2) Hemiplegia - Right/Left: Left Hemiplegia - dominant/non-dominant: Non-dominant    Time: 8945-8884 PT Time Calculation (min) (ACUTE ONLY): 21 min   Charges:   PT Evaluation $PT Eval Moderate Complexity: 1 Mod   PT General Charges $$ ACUTE PT VISIT: 1  Visit         Lenoard SQUIBB, PT Acute Rehabilitation Services Office: [phone]   Lenoard NOVAK Chicquita Mendel 01/30/2024, 12:42 PM

## 2024-01-30 NOTE — Progress Notes (Signed)
 Physical Therapy Note  Patient Details  Name: Attila Mccarthy MRN: [id number] Date of Birth: 1946/03/02 Today's Date: 01/30/2024    Physical Therapy Discharge Note  This patient was unable to complete the inpatient rehab program due to change in medical status; therefore did not meet their long term goals. Pt left the program at a max assist level for their functional mobility/ transfers. This patient is being discharged from PT services at this time.  Pt's perception of pain in the last five days was unable to answer at this time.    See CareTool for functional status details  If the patient is able to return to inpatient rehabilitation within 3 midnights, this may be considered an interrupted stay and therapy services will resume as ordered. Modification and reinstatement of their goals will be made upon completion of therapy service reevaluations.     Reche Ohara 01/30/2024, 2:07 PM

## 2024-01-30 NOTE — Progress Notes (Signed)
 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  Reportedly normal in the morning but BP dropped to 56/44 and became hypotensive in the afternoon.  BP improved after IV fluid and he started to begin to respond to pain stimuli.  Initially code stroke activated and he was evaluated by neurology who called off code stroke suspecting encephalopathy rather than CVA.  In ED, slightly tachycardic and tachypneic.  Slightly hyperglycemic with bicarb of 15 and anion gap of 20.  WBC 12.4.  Lactic acid 3.6.  Serial troponin and CK negative.  EKG with mild sinus tachycardia, LAD, RBBB and age-indeterminate lateral and inferior infarcts.  Foley catheter exchanged.  UA with moderate LE and few bacteria.  CXR and CT head without acute finding.  Patient was started on broad-spectrum antibiotics due to SIRS with lactic acidosis.  Cultures and CT abdomen and pelvis ordered.  CT suggested RLL bronchopneumonia with possible aspiration, T12 compression fracture with slight interval dorsal buckling and 25% height loss, early cirrhosis, clustered small gallstone with mild gallbladder dilation similar to prior studies, enlarged prostate and circumferentially thickened urinary bladder.   Patient refusing SLP evaluation.  Therapy recommended SNF.  Blood cultures NGTD.  Urine culture with yeast.  Antibiotic de-escalated to p.o. Augmentin  for aspiration pneumonia.  Subjective: Seen and examined earlier this morning.  No major events overnight or this morning.  Sleepy but awakes  to voice.  He is oriented to self and place but not time.  Follows commands.  Denies pain, shortness of breath, nausea or vomiting but not a great historian.   Assessment and plan: Acute metabolic encephalopathy: Likely due to sepsis from possible aspiration pneumonia and hypotension.  CT head without acute finding.  EEG suggested encephalopathy but no seizure.  Has some left-sided weakness likely from prior stroke.  Encephalopathy labs including TSH, ammonia, B12 and RPR unrevealing.  Encephalopathy seems to have improved.  He is sleepy but wakes to voice.  He is oriented to self, place and follows commands. -Reorientation and delirium precaution -Treat treatable causes  Severe sepsis due to aspiration pneumonia: Present on admission.  Tachycardia, tachypnea, leukocytosis, lactic acidosis and hypotension on presentation.  CT concerning for RLL aspiration pneumonia and possible cystitis.  UA with moderate LE and few bacteria.  MRSA PCR nonreactive.  Blood cultures NGTD.  Urine culture with cyst.  Foley catheter exchanged on admission.  UTI ruled out. -Change meropenem to Augmentin  for aspiration pneumonia -Discussed urine culture results with ID, Dr. Fleeta Rothman.  Exchanging Foley sufficient for funguria.  -Aspiration precaution -SLP eval-patient refused MBS. -Palliative medicine consulted per wife's request.  Cognitive impairment with delirium: Awake and alert but only oriented to self and time Anxiety and depression: Stable -Started on low-dose Seroquel  at night per wife's request -Continue Lexapro , use delirium precautions  - Palliative consulted per wife's request  IDDM-2 with hyperglycemia: A1c 8.5% on 10/7. Recent Labs  Lab 01/29/24 1322 01/29/24 1746 01/29/24 2133 01/30/24 0541 01/30/24 1117  GLUCAP 139* 146* 183* 188* 265*  -Increase SSI to sensitive -Cons[employer] Semglee  from 5 units twice daily  to 10 units daily  Normocytic anemia: Relatively stable Recent Labs     12/30/23 0506 01/01/24 1913 01/16/24 1659 01/17/24 0522 01/18/24 0417 01/25/24 0523 01/26/24 0516 01/28/24 1411 01/29/24 0256 01/30/24 0250  HGB 12.9* 12.7* 10.0* 11.9* 12.4* 12.4* 12.2* 12.6* 11.3* 10.8*  - Monitor intermittently  Hx of CVA: Seems to have residual left-sided weakness.  CT head without acute finding. -Continue ASA, Plavix , Lipitor   -PT/OT-recommended SNF   Chronic HFrEF: TTE in 11/2023 with LVEF of 30 to 35%.  Appears euvolemic on exam.  Not on diuretics - Monitor fluid and respiratory status.   Bladder outlet obstruction with chronic Foley -Foley catheter exchanged on admission -Outpatient follow-up with urology   Hypotension: Resolved. - Continue holding losartan   HAGMA: Likely due to lactic acidosis versus DKA.  Resolved.  Cholelithiasis-seems chronic.  Doubt cholecystitis.  No GI symptoms. - Outpatient follow-up  T12 compression fracture - Supportive care  Hypokalemia/hypomagnesemia -Monitor replenish K and Mg as appropriate  Goal of care: Already DNR.  Recurrent hospitalization.  Poor long-term prognosis. - Palliative consulted at request of patient's wife  Body mass index is 24.82 kg/m.           DVT prophylaxis:  enoxaparin  (LOVENOX ) injection 40 mg Start: 01/29/24 1000  Code Status: DNR Family Communication: Updated patient's wife at bedside yesterday.  None at bedside today Level of care: Telemetry Medical Status is: Inpatient Remains inpatient appropriate because: Severe sepsis due to aspiration pneumonia.   Final disposition: SNF.   55 minutes with more than 50% spent in reviewing records, counseling patient/family and coordinating care.  Consultants:  None  Procedures: None  Microbiology summarized: Blood cultures NGTD Urine culture pending  Objective: Vitals:   01/29/24 2320 01/30/24 0345 01/30/24 0846 01/30/24 1119  BP:  113/62 (!) 137/56 131/73  Pulse:  79 71 70  Resp:  18 17 17   Temp: 98.8 F (37.1 C)  97.8 F (36.6 C) 98.7 F (37.1 C) (!) 97.5 F (36.4 C)  TempSrc: Oral Axillary Oral Oral  SpO2:  96% 97% 97%  Weight:      Height:        Examination:  GENERAL: No apparent distress.  Nontoxic. HEENT: MMM.  Vision and hearing grossly intact.  NECK: Supple.  No apparent JVD.  RESP:  No IWOB.  Fair aeration bilaterally. CVS:  RRR. Heart sounds normal.  ABD/GI/GU: BS+. Abd soft, NTND.  Indwelling Foley.  Clear liquid urine. MSK/EXT:  Moves extremities. No apparent deformity. No edema.  SKIN: no apparent skin lesion or wound NEURO: Sleepy but wakes to voice.  Oriented to self and place.  Follows commands. Speech clear. Cranial nerves II-XII intact.  Motor 4/5 in LUE and 3+/5 in LLE.  5/5 in the right.  Light sensation intact in all dermatomes. Patellar reflex symmetric.   PSYCH: Calm. Normal affect.   Sch Meds:  Scheduled Meds:  amoxicillin -clavulanate  1 tablet Oral Q12H   aspirin  EC  81 mg Oral Daily   atorvastatin   80 mg Oral QHS   Chlorhexidine  Gluconate Cloth  6 each Topical Daily   clopidogrel   75 mg Oral Q breakfast   enoxaparin  (LOVENOX ) injection  40 mg Subcutaneous Q24H   escitalopram   10 mg Oral Daily   insulin  aspart  0-5 Units Subcutaneous QHS   insulin  aspart  0-9 Units Subcutaneous TID WC   insulin  glargine-yfgn  10 Units Subcutaneous Daily   pantoprazole   40 mg Oral QHS   QUEtiapine   25 mg Oral QHS  sodium chloride  flush  3 mL Intravenous Q12H   Continuous Infusions:   PRN Meds:.acetaminophen  **OR** acetaminophen , fentaNYL  (SUBLIMAZE ) injection, guaiFENesin , [COMPLETED] lactulose **FOLLOWED BY** lactulose, oxyCODONE , prochlorperazine   Antimicrobials: Anti-infectives (From admission, onward)    Start     Dose/Rate Route Frequency Ordered Stop   01/30/24 1430  amoxicillin -clavulanate (AUGMENTIN ) 875-125 MG per tablet 1 tablet        1 tablet Oral Every 12 hours 01/30/24 1344 02/04/24 0959   01/29/24 2300  vancomycin  (VANCOREADY) IVPB 1500 mg/300 mL   Status:  Discontinued        1,500 mg 150 mL/hr over 120 Minutes Intravenous Every 24 hours 01/28/24 2132 01/29/24 0812   01/29/24 1100  vancomycin  (VANCOCIN ) IVPB 1000 mg/200 mL premix  Status:  Discontinued        1,000 mg 200 mL/hr over 60 Minutes Intravenous Every 12 hours 01/29/24 0812 01/29/24 1259   01/28/24 2300  meropenem (MERREM) 1 g in sodium chloride  0.9 % 100 mL IVPB  Status:  Discontinued        1 g 200 mL/hr over 30 Minutes Intravenous Every 8 hours 01/28/24 2128 01/30/24 1344   01/28/24 2200  vancomycin  (VANCOREADY) IVPB 1750 mg/350 mL        1,750 mg 175 mL/hr over 120 Minutes Intravenous  Once 01/28/24 2119 01/29/24 0152        I have personally reviewed the following labs and images: CBC: Recent Labs  Lab 01/25/24 0523 01/26/24 0516 01/28/24 1411 01/29/24 0256 01/30/24 0250  WBC 8.1 7.4 12.4* 11.2* 6.0  NEUTROABS 4.8  --   --   --   --   HGB 12.4* 12.2* 12.6* 11.3* 10.8*  HCT 36.7* 35.9* 39.1 33.8* 32.1*  MCV 87.6 88.2 90.5 89.2 89.2  PLT 233 225 304 254 228   BMP &GFR Recent Labs  Lab 01/25/24 0523 01/26/24 0516 01/28/24 1411 01/29/24 0256 01/30/24 0250  NA 138 135 137 137 137  K 3.9 3.9 4.1 3.9 3.2*  CL 102 101 102 103 105  CO2 27 23 15* 23 24  GLUCOSE 174* 281* 154* 115* 236*  BUN 24* 20 32* 26* 19  CREATININE 0.84 0.77 1.19 0.91 0.76  CALCIUM  9.9 9.4 9.7 9.2 8.9  MG  --   --   --  1.4* 2.0   Estimated Creatinine Clearance: 84.9 mL/min (by C-G formula based on SCr of 0.76 mg/dL). Liver & Pancreas: Recent Labs  Lab 01/25/24 0523 01/28/24 2329 01/30/24 0250  AST 22 26 26   ALT 23 33 28  ALKPHOS 91 92 75  BILITOT 1.3* 1.5* 1.3*  PROT 5.5* 5.7* 4.7*  ALBUMIN 2.8* 3.0* 2.4*   No results for input(s): LIPASE, AMYLASE in the last 168 hours. Recent Labs  Lab 01/29/24 0256  AMMONIA 37*   Diabetic: No results for input(s): HGBA1C in the last 72 hours. Recent Labs  Lab 01/29/24 1322 01/29/24 1746 01/29/24 2133 01/30/24 0541  01/30/24 1117  GLUCAP 139* 146* 183* 188* 265*   Cardiac Enzymes: Recent Labs  Lab 01/28/24 1411  CKTOTAL 44*  CKMB 3.1   Recent Labs    01/16/24 1659  PROBNP 465.0*   Coagulation Profile: Recent Labs  Lab 01/28/24 2329  INR 1.0   Thyroid  Function Tests: Recent Labs    01/29/24 0256  TSH 1.073   Lipid Profile: No results for input(s): CHOL, HDL, LDLCALC, TRIG, CHOLHDL, LDLDIRECT in the last 72 hours. Anemia Panel: Recent Labs    01/29/24 0256  VITAMINB12 497  Urine analysis:    Component Value Date/Time   COLORURINE YELLOW 01/28/2024 1845   APPEARANCEUR CLOUDY (A) 01/28/2024 1845   LABSPEC 1.020 01/28/2024 1845   PHURINE 5.0 01/28/2024 1845   GLUCOSEU NEGATIVE 01/28/2024 1845   HGBUR NEGATIVE 01/28/2024 1845   BILIRUBINUR NEGATIVE 01/28/2024 1845   KETONESUR NEGATIVE 01/28/2024 1845   PROTEINUR 30 (A) 01/28/2024 1845   NITRITE NEGATIVE 01/28/2024 1845   LEUKOCYTESUR MODERATE (A) 01/28/2024 1845   Sepsis Labs: Invalid input(s): PROCALCITONIN, LACTICIDVEN  Microbiology: Recent Results (from the past 240 hours)  Culture, blood (Routine X 2) w Reflex to ID Panel     Status: None (Preliminary result)   Collection Time: 01/28/24  6:13 PM   Specimen: BLOOD RIGHT HAND  Result Value Ref Range Status   Specimen Description BLOOD RIGHT HAND  Final   Special Requests   Final    BOTTLES DRAWN AEROBIC ONLY Blood Culture results may not be optimal due to an inadequate volume of blood received in culture bottles   Culture   Final    NO GROWTH 2 DAYS Performed at [facility], [address] [place], [place] [address]    Report Status PENDING  Incomplete  Culture, blood (Routine X 2) w Reflex to ID Panel     Status: None (Preliminary result)   Collection Time: 01/28/24  6:13 PM   Specimen: BLOOD LEFT HAND  Result Value Ref Range Status   Specimen Description BLOOD LEFT HAND  Final   Special Requests   Final    BOTTLES DRAWN AEROBIC ONLY  Blood Culture results may not be optimal due to an inadequate volume of blood received in culture bottles   Culture   Final    NO GROWTH 2 DAYS Performed at [facility], [address], [place], [place] [address]    Report Status PENDING  Incomplete  Urine Culture (for pregnant, neutropenic or urologic patients or patients with an indwelling urinary catheter)     Status: Abnormal   Collection Time: 01/28/24  6:45 PM   Specimen: Urine, Catheterized  Result Value Ref Range Status   Specimen Description URINE, CATHETERIZED  Final   Special Requests   Final    NONE Performed at [facility], [address] [place], [place] [address]    Culture 60,000 COLONIES/mL YEAST (A)  Final   Report Status 01/30/2024 FINAL  Final  Remove and replace urinary cath (placed > 5 days) then obtain urine culture from new indwelling urinary catheter.     Status: Abnormal   Collection Time: 01/28/24  8:42 PM   Specimen: Urine, Catheterized  Result Value Ref Range Status   Specimen Description URINE, CATHETERIZED  Final   Special Requests   Final    NONE Performed at [facility], [address], [place], [place] [address]    Culture >=100,000 COLONIES/mL YEAST (A)  Final   Report Status 01/29/2024 FINAL  Final  MRSA Next Gen by PCR, Nasal     Status: None   Collection Time: 01/28/24  9:19 PM   Specimen: Nasal Mucosa; Nasal Swab  Result Value Ref Range Status   MRSA by PCR Next Gen NOT DETECTED NOT DETECTED Final    Comment: (NOTE) The GeneXpert MRSA Assay (FDA approved for NASAL specimens only), is one component of a comprehensive MRSA colonization surveillance program. It is not intended to diagnose MRSA infection nor to guide or monitor treatment for MRSA infections. Test performance is not  FDA approved in patients less than 59 years old. Performed at [facility], [address] [place], [place] [address]   Culture, blood (x 2)     Status: None (Preliminary result)    Collection Time: 01/28/24 11:29 PM   Specimen: BLOOD RIGHT HAND  Result Value Ref Range Status   Specimen Description BLOOD RIGHT HAND  Final   Special Requests   Final    BOTTLES DRAWN AEROBIC ONLY Blood Culture results may not be optimal due to an inadequate volume of blood received in culture bottles   Culture   Final    NO GROWTH 2 DAYS Performed at [facility], [address] [place], [place] [address]    Report Status PENDING  Incomplete  Culture, blood (x 2)     Status: None (Preliminary result)   Collection Time: 01/28/24 11:29 PM   Specimen: BLOOD RIGHT ARM  Result Value Ref Range Status   Specimen Description BLOOD RIGHT ARM  Final   Special Requests   Final    BOTTLES DRAWN AEROBIC ONLY Blood Culture results may not be optimal due to an inadequate volume of blood received in culture bottles   Culture   Final    NO GROWTH 2 DAYS Performed at [facility], [address], [place], [place] [address]    Report Status PENDING  Incomplete    Radiology Studies: No results found.     Ember Gottwald T. Tashiana Lamarca [facility]  If 7PM-7AM, please contact night-coverage [email or web address] 01/30/2024, 1:54 PM

## 2024-01-30 NOTE — Consult Note (Signed)
 Consultation Note Date: 01/30/2024   Patient Name: John [name]  DOB: 05/17/1945  MRN: [id number]  Age / Sex: 78 y.o., male  PCP: [name], DO Referring Physician: Kathrin Mignon DASEN, MD  Reason for Consultation: Establishing goals of care  HPI/Patient Profile: 78 y.o. 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   Reportedly normal in the morning but BP dropped to 56/44 and became hypotensive in the afternoon.  BP improved after IV fluid and he started to begin to respond to pain stimuli.  Initially code stroke activated and he was evaluated by neurology who called off code stroke suspecting encephalopathy rather than CVA.   In ED, slightly tachycardic and tachypneic.  Slightly hyperglycemic with bicarb of 15 and anion gap of 20.  WBC 12.4.  Lactic acid 3.6.  Serial troponin and CK negative.  EKG with mild sinus tachycardia, LAD, RBBB and age-indeterminate lateral and inferior infarcts.  Foley catheter exchanged.  UA with moderate LE and few bacteria.  CXR and CT head without acute finding.  Patient was started on broad-spectrum antibiotics due to SIRS with lactic acidosis.  Cultures and CT abdomen and pelvis ordered.   CT suggested RLL bronchopneumonia with possible aspiration, T12 compression fracture with slight interval dorsal buckling and 25% height loss, early cirrhosis, clustered small gallstone with mild gallbladder dilation similar to prior studies, enlarged prostate and thickened urinary bladder.   Multiple rehospitalization's over the past 6 months  Family face treatment option decisions, advanced directive decisions and anticipatory care needs.      Clinical Assessment and Goals of Care:  This NP Ronal Plants reviewed medical records, received report from team, assessed the patient and then meet at the patient's bedside along with his wife to discuss diagnosis, prognosis, GOC, EOL wishes disposition and options.   Concept of Palliative Care was introduced as specialized medical care for people and their families living with serious illness.  If focuses on providing relief from the symptoms and stress of a serious illness.  The goal is to improve quality of life for both the patient and the family.  Values and goals of care important to patient and family were attempted to be elicited.  Although patient is alert and oriented to person and place he is unable to consistently perform conversation and is intermittently confused.  Created space and opportunity for family to explore thoughts and feelings regarding current medical situation.  Wife is exhausted by the difficult situation of the past nine months of her husbnad's illness. She recognizes that her husband has had continued slow physical, functional and cognitive decline despite full medical support and interventions, along with most recent attempt at rehabilitation. Patient's increasing nursing and personal care needs are becoming more and more difficult to manage  A  discussion was  had today regarding advanced directives.  Concepts specific to code status, artifical feeding and hydration, continued IV antibiotics and rehospitalization was had.    MOST form introduced and a Hard Choices booklet was left for review.   The difference between a aggressive medical intervention path  and a palliative comfort care path for this patient at this time was had.       Wife appreciates the opportunity to contemplate decisions regarding continued interventions attempting to prolong life within the context of quality of life.  Patient and his wife have 2 children, she plans to talk with her children  tonight and hopefully we can set up a family goals of care meeting in the next 24 to 48 hours.  May  need to do phone conference as his children live out of town.  Education offered on hospice benefi philosophy and eligibility    Natural trajectory and expectations at EOL were discussed.  Questions and concerns addressed. Family  encouraged to call with questions or concerns.     PMT will continue to support holistically.             NEXT OF KIN/wife/ John [name]    SUMMARY OF RECOMMENDATIONS    Code Status/Advance Care Planning: Limited code   Symptom Management:  Per Attending  Palliative Prophylaxis:  Aspiration, Bowel Regimen, Delirium Protocol, Frequent Pain Assessment, and Oral Care  Additional Recommendations (Limitations, Scope, Preferences): Full Scope Treatment  Psycho-social/Spiritual:  Desire for further Chaplaincy support:no Additional Recommendations: Education on Hospice  Prognosis:  Unable to determine  Discharge Planning: To Be Determined      Primary Diagnoses: Present on Admission:  Acute encephalopathy  HFrEF (heart failure with reduced ejection fraction) (HCC)  Essential hypertension  Bladder outlet obstruction  Lactic acidosis  Cognitive impairment  Unresponsive episode   I have reviewed the medical record, interviewed the patient and family, and examined the patient. The following aspects are pertinent.  Past Medical History:  Diagnosis Date   BPH (benign prostatic hyperplasia)    CTS (carpal tunnel syndrome)    Depression    Diabetes mellitus    Essential hypertension    GERD (gastroesophageal reflux disease)    History of breast cancer    Hypercholesteremia    Neuropathy    Stroke [facility])    Social History   Socioeconomic History   Marital status: Married    Spouse name: Not on file   Number of children: Not on file   Years of education: Not on file   Highest education level: Bachelor's degree (e.g., BA, AB, BS)   Occupational History   Not on file  Tobacco Use   Smoking status: Never   Smokeless tobacco: Never  Vaping Use   Vaping status: Never Used  Substance and Sexual Activity   Alcohol use: No   Drug use: No   Sexual activity: Not Currently    Partners: Female  Other Topics Concern   Not on file  Social History Narrative   Not on file   Social Drivers of Health   Financial Resource Strain: Low Risk  (10/03/2023)   Overall Financial Resource Strain (CARDIA)    Difficulty of Paying Living Expenses: Not hard at all  Food Insecurity: Patient Unable To Answer (01/29/2024)   Hunger Vital Sign    Worried About Programme Researcher, Broadcasting/film/video in the Last Year: Patient unable to answer    Ran Out of Food in the Last Year: Patient unable to answer  Transportation Needs: No  Transportation Needs (01/29/2024)   PRAPARE - Administrator, Civil Service (Medical): No    Lack of Transportation (Non-Medical): No  Physical Activity: Inactive (10/03/2023)   Exercise Vital Sign    Days of Exercise per Week: 0 days    Minutes of Exercise per Session: Not on file  Stress: No Stress Concern Present (10/03/2023)   [employer] of Occupational Health - Occupational Stress Questionnaire    Feeling of Stress: Not at all  Social Connections: Unknown (01/29/2024)   Social Connection and Isolation Panel    Frequency of Communication with Friends and Family: Patient unable to answer    Frequency of Social Gatherings with Friends and Family: Patient unable to answer    Attends Religious Services: Not on file    Active Member of Clubs or Organizations: Not on file    Attends Banker Meetings: Not on file    Marital Status: Not on file  Recent Concern: Social Connections - Socially Isolated (12/05/2023)   Social Connection and Isolation Panel    Frequency of Communication with Friends and Family: Once a week    Frequency of Social Gatherings with Friends and Family: Never    Attends Religious  Services: Never    Database Administrator or Organizations: No    Attends Engineer, Structural: Never    Marital Status: Married   Family History  Problem Relation Age of Onset   Lymphoma Mother    Cancer Mother    Diabetes Father    Stroke Father    Ovarian cancer Sister    Cancer Sister    Cancer Paternal Grandmother    Scheduled Meds:  aspirin  EC  81 mg Oral Daily   atorvastatin   80 mg Oral QHS   Chlorhexidine  Gluconate Cloth  6 each Topical Daily   clopidogrel   75 mg Oral Q breakfast   enoxaparin  (LOVENOX ) injection  40 mg Subcutaneous Q24H   escitalopram   10 mg Oral Daily   insulin  aspart  0-5 Units Subcutaneous QHS   insulin  aspart  0-6 Units Subcutaneous TID WC   insulin  glargine-yfgn  10 Units Subcutaneous Daily   pantoprazole   40 mg Oral QHS   potassium chloride   40 mEq Oral Q3H   QUEtiapine   25 mg Oral QHS   sodium chloride  flush  3 mL Intravenous Q12H   Continuous Infusions:  meropenem (MERREM) IV 1 g (01/30/24 0545)   PRN Meds:.acetaminophen  **OR** acetaminophen , fentaNYL  (SUBLIMAZE ) injection, guaiFENesin , [COMPLETED] lactulose **FOLLOWED BY** lactulose, oxyCODONE , prochlorperazine  Medications Prior to Admission:  Prior to Admission medications   Medication Sig Start Date End Date Taking? Authorizing Provider  acetaminophen  (TYLENOL ) 325 MG tablet Take 2 tablets (650 mg total) by mouth every 6 (six) hours as needed for mild pain (pain score 1-3) or fever (or Fever >/= 101). Patient taking differently: Take 650 mg by mouth every 8 (eight) hours as needed for mild pain (pain score 1-3) or fever (or Fever >/= 101). 12/30/23   Sherrill Cable Latif, DO  Ascorbic Acid  (VITAMIN C) 1000 MG tablet Take 1,000 mg by mouth 2 (two) times a week.    [provider]  aspirin  EC 81 MG tablet Take 1 tablet (81 mg total) by mouth daily. Swallow whole. 01/25/24   Drusilla Sabas RAMAN, MD  atorvastatin  (LIPITOR ) 80 MG tablet Take 1 tablet (80 mg total) by mouth at bedtime.  09/14/23   [name], DO  bisacodyl  (DULCOLAX) 10 MG suppository Place 10 mg rectally  daily as needed for moderate constipation.    [provider]  clopidogrel  (PLAVIX ) 75 MG tablet Take 1 tablet (75 mg total) by mouth daily with breakfast for 21 days. 01/17/24 02/07/24  Drusilla Sabas RAMAN, MD  clotrimazole -betamethasone  (LOTRISONE ) cream Apply 1 Application topically daily. Patient not taking: Reported on 01/17/2024 07/11/23   [name], DO  cyanocobalamin  1000 MCG tablet Take 1 tablet (1,000 mcg total) by mouth daily. 09/14/23   [name], DO  escitalopram  (LEXAPRO ) 10 MG tablet Take 1 tablet (10 mg total) by mouth daily. 10/03/23   [name], DO  ferrous sulfate  (FEROSUL) 325 (65 FE) MG tablet Take 1 tablet (325 mg total) by mouth daily with breakfast. 11/28/23   Frann, Mabel Mt, DO  finasteride  (PROSCAR ) 5 MG tablet Take 1 tablet (5 mg total) by mouth daily. *Need appointment for future refills.* 11/28/23   Wendling, Mabel Mt, DO  hyoscyamine  (LEVSIN  SL) 0.125 MG SL tablet Place 1 tablet (0.125 mg total) under the tongue every 4 (four) hours as needed for bladder spasms. Patient not taking: Reported on 01/17/2024 08/17/23   Angiulli, Toribio PARAS, PA-C  insulin  aspart (NOVOLOG ) 100 UNIT/ML injection Inject 3 Units into the skin 3 (three) times daily with meals. 01/24/24   Drusilla Sabas RAMAN, MD  insulin  aspart (NOVOLOG ) 100 UNIT/ML injection Inject 0-9 Units into the skin 3 (three) times daily with meals. Sliding scale insulin  Less than 70 initiate hypoglycemia protocol 70-120  0 units 120-150 1 unit 151-200 2 units 201-250 3 units 251-300 5 units 301-350 7 units 351-400 9 units  Greater than 400 call MD 01/24/24   Drusilla Sabas RAMAN, MD  insulin  glargine (LANTUS ) 100 UNIT/ML injection Inject 0.15 mLs (15 Units total) into the skin daily. 01/02/24   Leotis Bogus, MD  losartan  (COZAAR ) 25 MG tablet Take 1 tablet (25 mg total) by mouth daily.  12/23/23   Jillian Buttery, MD  magnesium  hydroxide (MILK OF MAGNESIA) 400 MG/5ML suspension Take 30 mLs by mouth daily as needed for mild constipation.    [provider]  metFORMIN  (GLUCOPHAGE ) 500 MG tablet Take 1 tablet (500 mg total) by mouth 2 (two) times daily with a meal. 12/22/23   Jillian Buttery, MD  Multiple Vitamin (MULTIVITAMIN) tablet Take 1 tablet by mouth 2 (two) times a week.    [provider]  ondansetron  (ZOFRAN -ODT) 4 MG disintegrating tablet Take 1 tablet (4 mg total) by mouth every 8 (eight) hours as needed for nausea or vomiting. 12/23/23   Frann, Mabel Mt, DO  pantoprazole  (PROTONIX ) 40 MG tablet Take 1 tablet (40 mg total) by mouth at bedtime. 12/19/23   [name], DO  polyethylene glycol (MIRALAX  / GLYCOLAX ) 17 g packet Take 17 g by mouth daily. 12/30/23   Sherrill Alejandro Donovan, DO  PRESCRIPTION MEDICATION Place 1 suppository rectally daily as needed (Constipation).    [provider]  senna-docusate (SENOKOT-S) 8.6-50 MG tablet Take 1 tablet by mouth 2 (two) times daily. 12/15/23   Lue [name], MD  amLODipine  (NORVASC ) 2.5 MG tablet Take 1 tablet (2.5 mg total) by mouth daily. 06/15/23 06/30/23  [name], DO  apixaban  (ELIQUIS ) 5 MG TABS tablet Take 1 tablet (5 mg total) by mouth 2 (two) times daily. 10/11/23 10/11/23  [name], DO   Allergies  Allergen Reactions   Ramipril Anaphylaxis   Adhesive [Tape] Rash   Other Diarrhea    Severe intolerance to Chemotherapy in the past.  Sertraline Other (See Comments)    Extreme headaches   Review of Systems  Unable to perform ROS: Dementia    Physical Exam Cardiovascular:     Rate and Rhythm: Normal rate.  Pulmonary:     Effort: Pulmonary effort is normal.  Skin:    General: Skin is warm and dry.  Neurological:     Mental Status: He is alert.     Comments: Intermittently confused, easily agitated      Vital Signs: BP 131/73 (BP Location:  Left Arm)   Pulse 70   Temp (!) 97.5 F (36.4 C) (Oral)   Resp 17   Ht 6' (1.829 m)   Wt 83 kg   SpO2 97%   BMI 24.82 kg/m  Pain Scale: 0-10   Pain Score: Asleep   SpO2: SpO2: 97 % O2 Device:SpO2: 97 % O2 Flow Rate: .O2 Flow Rate (L/min): 2 L/min  IO: Intake/output summary:  Intake/Output Summary (Last 24 hours) at 01/30/2024 1212 Last data filed at 01/30/2024 1122 Gross per 24 hour  Intake 623 ml  Output 1125 ml  Net -502 ml    LBM: Last BM Date : 01/29/24 Baseline Weight: Weight: 83 kg Most recent weight: Weight: 83 kg     Palliative Assessment/Data: 40 % at best   Discussed with Dr Kathrin   Time:   75 minutes  Signed by: Ronal Plants, NP   Please contact Palliative Medicine Team phone at [phone] for questions and concerns.  For individual provider: See Tracey

## 2024-01-31 DIAGNOSIS — R627 Adult failure to thrive: Secondary | ICD-10-CM

## 2024-01-31 DIAGNOSIS — G9340 Encephalopathy, unspecified: Secondary | ICD-10-CM | POA: Diagnosis not present

## 2024-01-31 DIAGNOSIS — I10 Essential (primary) hypertension: Secondary | ICD-10-CM | POA: Diagnosis not present

## 2024-01-31 DIAGNOSIS — Z515 Encounter for palliative care: Secondary | ICD-10-CM

## 2024-01-31 DIAGNOSIS — Z8673 Personal history of transient ischemic attack (TIA), and cerebral infarction without residual deficits: Secondary | ICD-10-CM | POA: Diagnosis not present

## 2024-01-31 DIAGNOSIS — I5020 Unspecified systolic (congestive) heart failure: Secondary | ICD-10-CM | POA: Diagnosis not present

## 2024-01-31 LAB — RENAL FUNCTION PANEL
Albumin: 2.6 g/dL — ABNORMAL LOW (ref 3.5–5.0)
Anion gap: 7 (ref 5–15)
BUN: 11 mg/dL (ref 8–23)
CO2: 25 mmol/L (ref 22–32)
Calcium: 9.2 mg/dL (ref 8.9–10.3)
Chloride: 105 mmol/L (ref 98–111)
Creatinine, Ser: 0.67 mg/dL (ref 0.61–1.24)
GFR, Estimated: >60 mL/min (ref >60)
Glucose, Bld: 156 mg/dL — ABNORMAL HIGH (ref 70–99)
Phosphorus: 2.2 mg/dL — ABNORMAL LOW (ref 2.5–4.6)
Potassium: 3.8 mmol/L (ref 3.5–5.1)
Sodium: 137 mmol/L (ref 135–145)

## 2024-01-31 LAB — CBC
HCT: 34.5 % — ABNORMAL LOW (ref 39.0–52.0)
Hemoglobin: 11.6 g/dL — ABNORMAL LOW (ref 13.0–17.0)
MCH: 29.5 pg (ref 26.0–34.0)
MCHC: 33.6 g/dL (ref 30.0–36.0)
MCV: 87.8 fL (ref 80.0–100.0)
Platelets: 253 K/uL (ref 150–400)
RBC: 3.93 MIL/uL — ABNORMAL LOW (ref 4.22–5.81)
RDW: 17.4 % — ABNORMAL HIGH (ref 11.5–15.5)
WBC: 7.1 K/uL (ref 4.0–10.5)
nRBC: 0 % (ref 0.0–0.2)

## 2024-01-31 LAB — GLUCOSE, CAPILLARY
Glucose-Capillary: 127 mg/dL — ABNORMAL HIGH (ref 70–99)
Glucose-Capillary: 165 mg/dL — ABNORMAL HIGH (ref 70–99)
Glucose-Capillary: 221 mg/dL — ABNORMAL HIGH (ref 70–99)
Glucose-Capillary: 305 mg/dL — ABNORMAL HIGH (ref 70–99)

## 2024-01-31 LAB — MAGNESIUM: Magnesium: 1.5 mg/dL — ABNORMAL LOW (ref 1.7–2.4)

## 2024-01-31 MED ORDER — LOSARTAN POTASSIUM 25 MG PO TABS
25.0000 mg | ORAL_TABLET | Freq: Every day | ORAL | Status: DC
  Administered 2024-01-31 – 2024-02-03 (×4): 25 mg via ORAL
  Filled 2024-01-31 (×4): qty 1

## 2024-01-31 MED ORDER — MAGNESIUM SULFATE 2 GM/50ML IV SOLN
2.0000 g | Freq: Once | INTRAVENOUS | Status: AC
  Administered 2024-01-31: 2 g via INTRAVENOUS
  Filled 2024-01-31: qty 50

## 2024-01-31 NOTE — Evaluation (Signed)
 Occupational Therapy Evaluation Patient Details Name: John [name] MRN: [id number] DOB: Apr 25, 1945 Today's Date: 01/31/2024   History of Present Illness   78 y.o. male adm 01/28/24 from [facility] hypotensive and unresponsive with RLL PNA, encephalopathy. PMHx: 10/6-10/14 admission for Lt weakness with Rt pontine infarct.T12 comp fx, HTN, HLD, T2DM, CVA, cognitive deficits, CAD, heart block s/p PPM, HFrEF, DVT, BPH, urinary retention with Foley catheter     Clinical Impressions Pt ind prior to last admission/rehab stay, was living with spouse and used RW for mobility/ind with ADL. Pt with impaired cognition, difficulty recalling where he is and why he is back in the 'acute' area of the hospital. Pt overall needs up to max A for ADLs, mod A for bed mobility, pt using RUE to grab bedrail and assist with sitting upright. Pt able to sit EOB x8 mins for seated activity before returning to supine. Pt with LUE/LLE weakness and impaired coordination. Pt denies dizziness EOB and BP stable. Pt presenting with impairments listed below, will follow acutely. Patient will benefit from intensive inpatient follow-up therapy, >3 hours/day to maximize safety/ind with ADL/functional mobility.      If plan is discharge home, recommend the following:   A lot of help with bathing/dressing/bathroom;A lot of help with walking and/or transfers;Assistance with cooking/housework;Assist for transportation;Help with stairs or ramp for entrance;Supervision due to cognitive status;Direct supervision/assist for medications management;Direct supervision/assist for financial management     Functional Status Assessment   Patient has had a recent decline in their functional status and demonstrates the ability to make significant improvements in function in a reasonable and predictable amount of time.     Equipment Recommendations   Other (comment) (defer)     Recommendations for Other Services   PT consult      Precautions/Restrictions   Precautions Precautions: Fall;Back Precaution Booklet Issued: No Recall of Precautions/Restrictions: Impaired Precaution/Restrictions Comments: L hemiparesis     Mobility Bed Mobility Overal bed mobility: Needs Assistance Bed Mobility: Rolling, Supine to Sit, Sit to Supine Rolling: Mod assist   Supine to sit: Mod assist Sit to supine: Mod assist        Transfers                   General transfer comment: will need +2 assist/lift equipment      Balance Overall balance assessment: Needs assistance Sitting-balance support: Feet supported, Bilateral upper extremity supported Sitting balance-Leahy Scale: Poor Sitting balance - Comments: pt using RUE on bedrail to hold self upright                                   ADL either performed or assessed with clinical judgement   ADL Overall ADL's : Needs assistance/impaired Eating/Feeding: Minimal assistance;Bed level   Grooming: Minimal assistance;Sitting   Upper Body Bathing: Sitting;Cueing for compensatory techniques;Minimal assistance;Moderate assistance   Lower Body Bathing: Maximal assistance;Bed level   Upper Body Dressing : Moderate assistance;Sitting   Lower Body Dressing: Maximal assistance   Toilet Transfer: Maximal assistance;+2 for physical assistance   Toileting- Clothing Manipulation and Hygiene: Maximal assistance       Functional mobility during ADLs: Maximal assistance;+2 for physical assistance       Vision   Vision Assessment?: No apparent visual deficits     Perception Perception: Within Functional Limits       Praxis Praxis: WFL       Pertinent Vitals/Pain Pain Assessment  Pain Assessment: No/denies pain     Extremity/Trunk Assessment Upper Extremity Assessment RUE Deficits / Details: AROM WFL. Gross strength 4+/5. LUE Deficits / Details: prefers to use RUE, reduced dexterity and general strength in entire LUE, impaired  coordination, 3+/5 LUE Sensation: decreased light touch LUE Coordination: decreased fine motor;decreased gross motor   Lower Extremity Assessment Lower Extremity Assessment: Defer to PT evaluation   Cervical / Trunk Assessment Cervical / Trunk Assessment: Kyphotic;Other exceptions Cervical / Trunk Exceptions: T12 comp fx hx, back pain, kyphotic with rt lean in sitting   Communication Communication Communication: Impaired Factors Affecting Communication: Hearing impaired   Cognition Arousal: Alert Behavior During Therapy: Flat affect Cognition: Cognition impaired   Orientation impairments: Situation   Memory impairment (select all impairments): Short-term memory, Declarative long-term memory   Executive functioning impairment (select all impairments): Problem solving OT - Cognition Comments: cues to recall place and why he is in the hosptial                 Following commands: Impaired Following commands impaired: Follows one step commands inconsistently, Follows one step commands with increased time     Cueing  General Comments   Cueing Techniques: Verbal cues;Tactile cues;Visual cues  VSS   Exercises     Shoulder Instructions      Home Living Family/patient expects to be discharged to:: Inpatient rehab Living Arrangements: Spouse/significant other Available Help at Discharge: Family Type of Home: House Home Access: Stairs to enter Secretary/administrator of Steps: 4 Entrance Stairs-Rails: Can reach both Home Layout: Able to live on main level with bedroom/bathroom;Multi-level Alternate Level Stairs-Number of Steps: He reported sleeping in a recliner on the main level of the home. All bedrooms are upstairs Alternate Level Stairs-Rails: Right;Left;Can reach both Bathroom Shower/Tub: Producer, television/film/video: Standard     Home Equipment: Agricultural consultant (2 wheels);Cane - single point;BSC/3in1;Wheelchair - power   Additional Comments: DC from SNF  10/6 after tx for comp fx and admitted with CVA same date      Prior Functioning/Environment Prior Level of Function : Needs assist             Mobility Comments: reports using RW ADLs Comments: ind with ADL:    OT Problem List: Decreased strength;Decreased activity tolerance;Impaired balance (sitting and/or standing);Decreased cognition;Decreased range of motion;Decreased coordination;Decreased knowledge of use of DME or AE;Impaired sensation;Impaired tone;Impaired UE functional use   OT Treatment/Interventions: Self-care/ADL training;Therapeutic activities;DME and/or AE instruction;Balance training;Therapeutic exercise;Neuromuscular education;Energy conservation;Patient/family education;Cognitive remediation/compensation      OT Goals(Current goals can be found in the care plan section)   Acute Rehab OT Goals Patient Stated Goal: none stated OT Goal Formulation: With patient Time For Goal Achievement: 02/14/24 Potential to Achieve Goals: Fair ADL Goals Pt Will Perform Grooming: sitting;with set-up Pt/caregiver will Perform Home Exercise Program: Increased ROM;Increased strength;Left upper extremity;With Supervision;With written HEP provided Additional ADL Goal #1: pt will perform bed mobility min A in prep for ADLs Additional ADL Goal #2: pt will demo good seated balance in prep for ADLs   OT Frequency:  Min 2X/week    Co-evaluation              AM-PAC OT 6 Clicks Daily Activity     Outcome Measure Help from another person eating meals?: A Little Help from another person taking care of personal grooming?: A Little Help from another person toileting, which includes using toliet, bedpan, or urinal?: A Lot Help from another person bathing (including washing, rinsing,  drying)?: A Lot Help from another person to put on and taking off regular upper body clothing?: A Lot Help from another person to put on and taking off regular lower body clothing?: A Lot 6 Click  Score: 14   End of Session Nurse Communication: Mobility status  Activity Tolerance: Patient tolerated treatment well Patient left: in bed;with call bell/phone within reach;with bed alarm set  OT Visit Diagnosis: Unsteadiness on feet (R26.81);Other abnormalities of gait and mobility (R26.89);Muscle weakness (generalized) (M62.81);Feeding difficulties (R63.3);Other symptoms and signs involving cognitive function;Hemiplegia and hemiparesis Hemiplegia - Right/Left: Left Hemiplegia - dominant/non-dominant: Non-Dominant                Time: 9048-8986 OT Time Calculation (min): 22 min Charges:  OT General Charges $OT Visit: 1 Visit OT Evaluation $OT Eval Moderate Complexity: 1 Mod  Meldon Hanzlik K, OTD, OTR/L SecureChat Preferred Acute Rehab [phone]   Matthewjames Petrasek K Koonce 01/31/2024, 11:22 AM

## 2024-01-31 NOTE — TOC Initial Note (Addendum)
 Transition of Care [facility]) - Initial/Assessment Note    Patient Details  Name: John [name] MRN: [id number] Date of Birth: March 13, 1946  [facility] CM/SW Contact:    Luise JAYSON Pan, LCSWA Phone Number: 01/31/2024, 1:49 PM  Clinical Narrative:     CSW spoke with patients wife about PT recs for SNF rehab. [name] inquired if patient can return to CIR at [facility]. CSW asked CIR if patient can return. CSW inquired with [name] if patient is unable to, would she be okay with SNF. [name] stated she would want patient to go to 1. [facility], 2. [facility]. 3. [facility]. CSW inquired w/ wife if patient would be okay paying [facility] $48/day fee for private rooms. [name] stated yes.   Patient was at [facility] from 9/22-10/06. Patient not in copay days with insurance yet.   1:58 PM Per CIR, patient may potentially be able to return to CIR.   4:16 PM CSW returned phone calls from [place]. [name] asked for an update about CIR. CSW informed her that CIR stated patient could potentially return.   CSW will continue to follow.          Expected Discharge Plan: Skilled Nursing Facility Barriers to Discharge: Continued Medical Work up, SNF Pending bed offer, Insurance Authorization   Patient Goals and CMS Choice Patient states their goals for this hospitalization and ongoing recovery are:: To get better          Expected Discharge Plan and Services In-house Referral: Clinical Social Work     Living arrangements for the past 2 months: Single Family Home                                      Prior Living Arrangements/Services Living arrangements for the past 2 months: Single Family Home Lives with:: Spouse Patient language and need for interpreter reviewed:: Yes Do you feel safe going back to the place where you live?: Yes      Need for Family Participation in Patient Care: Yes (Comment) Care giver support system in place?: Yes (comment) Current home services: DME  (walker/ shower seat) Criminal Activity/Legal Involvement Pertinent to Current Situation/Hospitalization: No - Comment as needed  Activities of Daily Living      Permission Sought/Granted Permission sought to share information with : Facility Medical sales representative, Family Supports Permission granted to share information with : Yes, Verbal Permission Granted  Share Information with NAME: [name]  Permission granted to share info w AGENCY: SNFs  Permission granted to share info w Relationship: Wife  Permission granted to share info w Contact Information: [phone]  Emotional Assessment Appearance:: Appears stated age Attitude/Demeanor/Rapport: Unable to Assess Affect (typically observed): Unable to Assess Orientation: : Oriented to Self, Oriented to Place, Oriented to  Time Alcohol / Substance Use: Not Applicable Psych Involvement: No (comment)  Admission diagnosis:  Altered mental status [R41.82] Sepsis (HCC) [A41.9] Acute encephalopathy [G93.40] Patient Active Problem List   Diagnosis Date Noted   Prerenal azotemia 01/28/2024   Unresponsive episode 01/28/2024   Bacterial UTI 01/28/2024   Right pontine stroke (HCC) 01/24/2024   Acute cystitis without hematuria 01/17/2024   History of CVA (cerebrovascular accident) 01/17/2024   Weakness 01/17/2024   Left-sided weakness 01/16/2024   Intractable nausea and vomiting 12/26/2023   Constipation 12/26/2023   AMS (altered mental status) 12/13/2023   Altered mental status 12/09/2023   Nausea & vomiting 12/09/2023  Status post placement of cardiac pacemaker 12/09/2023   Sepsis (HCC) 12/04/2023   CAD S/P percutaneous coronary angioplasty 12/04/2023   HFrEF (heart failure with reduced ejection fraction) (HCC) 08/17/2023   DVT, femoral, chronic (HCC) 08/15/2023   Bladder outlet obstruction 08/15/2023   Sepsis due to Escherichia coli (HCC) 08/15/2023   Debility 08/13/2023   Non-ST elevation (NSTEMI) myocardial infarction (HCC)  08/10/2023   Cholelithiasis 08/07/2023   VTE (venous thromboembolism) 08/07/2023   Lactic acidosis 08/07/2023   Sacral pressure ulcer 08/07/2023   Streptococcal bacteremia 08/07/2023   B12 deficiency 08/07/2023   Iron deficiency anemia 08/07/2023   Severe sepsis (HCC) 08/06/2023   Chronic diastolic CHF (congestive heart failure) (HCC) 08/06/2023   Bacteriuria 08/06/2023   Acute encephalopathy 08/06/2023   Essential hypertension 06/15/2023   Situational depression 06/15/2023   Protein-calorie malnutrition, severe 05/12/2023   Heart block AV complete (HCC) 05/12/2023   Syncope 05/10/2023   Fall at home, initial encounter 05/08/2023   Hypotension 05/08/2023   Abnormal LFTs 05/08/2023   Anemia 05/08/2023   Coping style affecting medical condition 05/02/2023   Acute renal failure superimposed on chronic kidney disease 04/28/2023   Brainstem infarct, acute (HCC) 04/25/2023   Pressure injury of skin 04/25/2023   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   Acute kidney injury superimposed on chronic kidney disease 04/17/2023   DKA (diabetic ketoacidosis) (HCC) 04/16/2023   Type 2 diabetes mellitus with hyperglycemia, with long-term current use of insulin  (HCC) 07/26/2022   Bronchitis 03/20/2021   Acute non-recurrent frontal sinusitis 03/20/2021   Viral URI with cough 01/13/2021   Hyperlipidemia 11/21/2020   Dizziness and giddiness 10/20/2020   Localized swelling of both lower extremities 10/20/2020   Tendinitis of right hip flexor 10/20/2020   Aortic atherosclerosis 09/10/2020   Insomnia 09/10/2020   Cognitive impairment 08/18/2020   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   Urinary retention 06/04/2020   History of COVID-19 12/10/2019   Physical deconditioning 12/10/2019   Fatigue 12/10/2019   Unsteady gait 12/10/2019   Pneumonia due to COVID-19 virus 10/26/2019   Peripheral neuropathy 10/17/2019   Depression, major, single episode, complete remission 10/17/2019    History of breast cancer    GERD (gastroesophageal reflux disease)    BPH (benign prostatic hyperplasia)    CTS (carpal tunnel syndrome)    Diabetic polyneuropathy associated with type 2 diabetes mellitus (HCC) 09/29/2019   Neuropathic pain of foot 09/29/2019   Foley catheter in place 01/03/2019   Peripheral sensory neuropathy 05/17/2018   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   Hypomagnesemia 01/18/2018   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) 12/13/2017   Axillary adenopathy 12/09/2017   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   S/P cataract extraction and insertion of intraocular lens 01/17/2017   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   PVD (posterior vitreous detachment), left 01/07/2017   Hyperopia of both eyes with astigmatism 11/09/2016   Class 1 obesity due to excess calories without serious comorbidity with body mass index (BMI) of 31.0 to 31.9 in adult 09/17/2016   Encounter for monitoring tamoxifen  therapy 03/05/2016   Insulin  dependent type 2 diabetes mellitus (HCC) 07/02/2015   Diabetes 1.5, 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 [place] [address] Phone: [phone] Fax: [phone]     Social Drivers of Health (SDOH) Social History: SDOH Screenings   Food Insecurity: Patient Unable To Answer (01/29/2024)  Housing: Unknown (01/29/2024)  Transportation Needs: No Transportation Needs (01/29/2024)  Utilities: Not At Risk (01/29/2024)  Alcohol Screen: Low Risk  (01/27/2023)  Depression (PHQ2-9): Low Risk  (01/28/2023)  Financial Resource Strain: Low Risk  (10/03/2023)  Physical Activity: Inactive (10/03/2023)   Social Connections: Unknown (01/29/2024)  Recent Concern: Social Connections - Socially Isolated (12/05/2023)  Stress: No Stress Concern Present (10/03/2023)  Tobacco Use: Low Risk  (01/28/2024)   SDOH Interventions:     Readmission Risk Interventions    12/06/2023    4:03 PM 05/11/2023   12:52 PM 04/22/2023   12:12 PM  Readmission Risk Prevention Plan  Medication Screening   Complete  Transportation Screening Complete Complete Complete  PCP or Specialist Appt within 3-5 Days  Complete   HRI or Home Care Consult  Complete   Social Work Consult for Recovery Care Planning/Counseling  Complete   Palliative Care Screening  Not Applicable   Medication Review Oceanographer) Complete Complete   HRI or Home Care Consult Complete    SW Recovery Care/Counseling Consult Complete    Palliative Care Screening Not Applicable    Skilled Nursing Facility Not Applicable

## 2024-01-31 NOTE — Progress Notes (Signed)
 John [name] PHYSICAL MEDICINE AND REHABILITATION  CONSULT SERVICE NOTE    [name] is well-known to the rehab service.  I transferred him off this past weekend when he developed sepsis due to aspiration pneumonia.  He is fortunately stabilizing from a medical standpoint.  Therapy has worked with him this week and he has been at a max assist level for basic bed mobility and transfers.  He saw occupational therapy today and was mod to max assist with ADLs.  It is unclear at this time whether his wife will be able to accommodate his eventual needs at home.  When we initially admitted [name] we were optimistic that he could reach a level where he and his wife could manage.  However with his additional medical complications, he likely will require at least a heavy minimal assist to mod assist for basic mobility and self-care at time of discharge.  Apparently, he only has 5 days of skilled nursing facility benefits remaining.  At this time we need to determine what wife and family can manage physically at home. If he's too much to handle, would they potentially be able to privately pay for help?   Rehab admissions coordinator to follow-up.  Arthea IVAR Gunther, MD, [facility] Health Physical Medicine & Rehabilitation Medical Director Rehabilitation Services 01/31/2024

## 2024-01-31 NOTE — Plan of Care (Signed)
 Patient was admitted to acute care for medical reasons

## 2024-01-31 NOTE — Discharge Summary (Signed)
 Physician Discharge Summary  Patient ID: John [name] [name]: [name] DOB/AGE: [birth year] 78 y.o.  Admit date: 01/24/2024 Discharge date: 01/31/2024  Discharge Diagnoses:  Principal Problem:   Right pontine stroke [facility]) Active Problems:   Type 2 diabetes mellitus with hyperglycemia, with long-term current use of insulin  (HCC)   Prerenal azotemia   Unresponsive episode   Bacterial UTI   Discharged Condition: serious  Significant Diagnostic Studies: CT ABDOMEN PELVIS W CONTRAST Result Date: 01/29/2024 EXAM: CT ABDOMEN AND PELVIS WITH CONTRAST 01/29/2024 01:48:25 AM TECHNIQUE: CT of the abdomen and pelvis was performed with the administration of 75 mL of iohexol  (OMNIPAQUE ) 350 MG/ML injection. Multiplanar reformatted images are provided for review. Automated exposure control, iterative reconstruction, and/or weight-based adjustment of the mA/kV was utilized to reduce the radiation dose to as low as reasonably achievable. COMPARISON: CT with iv contrast 12/26/2023 and 12/19/2023. CLINICAL HISTORY: Abdominal pain, acute, nonlocalized; Sepsis. FINDINGS: LOWER CHEST: The cardiac size is normal. There are pacemaker wires in the right heart. There are 3-vessel coronary calcifications and minimal anterior pericardial effusion. In the right lower lobe, there is interval increased bronchial thickening and patchy airspace consolidation much of which is peribronchovascular, consistent with bronchopneumonia with an aspiration component possible. The remaining lung bases are clear. No pneumothorax. LIVER: The liver is 20 cm in length and mildly steatotic with capsular nodularity over the liver undersurface compatible with early cirrhosis. The hepatic portal vein is normal caliber. There is no liver mass. GALLBLADDER AND BILE DUCTS: Clustered small stones in the proximal gallbladder. Mild gallbladder dilatation similar to prior studies. No wall thickening or ductal dilatation. SPLEEN: Calcified granulomas  within a normal sized spleen. No mass. PANCREAS: Partial fatty atrophy in the head, uncinate process, and neck of the pancreas. No pancreatic mass or ductal dilatation. ADRENAL GLANDS: No acute abnormality. KIDNEYS, URETERS AND BLADDER: No stones in the kidneys or ureters. No hydronephrosis. No renal mass enhancement. No perinephric or periureteral stranding. Urinary bladder is catheterized and again is noted circumferentially thickened which is typically either due to cystitis or hypertrophy. [name] GI AND BOWEL: Stomach demonstrates no acute abnormality. There is no bowel obstruction. PERITONEUM AND RETROPERITONEUM: Minimal ascites in the perihepatic and presacral spaces is unchanged. No free air. VASCULATURE: Aorta is normal in caliber. Patchy aortoiliac calcific plaque without aneurysm or dissection. LYMPH NODES: No lymphadenopathy. REPRODUCTIVE ORGANS: The prostate is enlarged, measuring 5 cm with peripheral calcifications on the left. BONES AND SOFT TISSUES: Osteopenia. Superior endplate anterior wedge compression fracture of the T12 vertebral body is again noted; there is slight interval dorsal buckling of the posterior superior cortex and there is increased anterior vertebral height loss, was 20% now 25%. This was first seen on 12/26/2023 and no new compression fracture is evident. Mild features of bilateral hip arthrosis. . Bridging osteophytes over the anterior left SI joint. No focal soft tissue abnormality. IMPRESSION: 1. Right lower lobe bronchopneumonia with possible aspiration component. 2. Superior endplate anterior wedge compression fracture of T12 with slight interval dorsal buckling superiorly and increased anterior vertebral height loss, now 25 percent. No new compression fracture. 3. Early cirrhosis with mild hepatic steatosis. No liver mass. 4. Clustered small gallstones with mild gallbladder dilatation, similar to prior studies. No wall thickening or ductal dilatation. 5. Enlarged prostate with  peripheral calcifications on the left. 6. Circumferentially thickened bladder most likely either due to cystitis or hypertrophy, unchanged. 7. Aortic atherosclerosis. Coronary atherosclerosis. Electronically signed by: Francis Quam MD 01/29/2024 02:19 AM EDT RP Workstation: [id number]  DG CHEST PORT 1 VIEW Result Date: 01/28/2024 EXAM: 1 VIEW(S) XRAY OF THE CHEST 01/28/2024 06:32:00 PM COMPARISON: 01/16/2024 CLINICAL HISTORY: SOB (shortness of breath) [id number]. Reason for exam: SHOB FINDINGS: LINES, TUBES AND DEVICES: Left chest cardiac pacing device in place. LUNGS AND PLEURA: Low lung volumes. No focal pulmonary opacity. No pulmonary edema. No pleural effusion. No pneumothorax. HEART AND MEDIASTINUM: No acute abnormality of the cardiac and mediastinal silhouettes. BONES AND SOFT TISSUES: No acute osseous abnormality. IMPRESSION: 1. No acute cardiopulmonary process. Electronically signed by: Norman Gatlin MD 01/28/2024 06:40 PM EDT RP Workstation: [id number]   EEG adult Result Date: 01/28/2024 Shelton Arlin KIDD, MD     01/28/2024  5:46 PM Patient Name: John [name] Epilepsy Attending: Arlin KIDD Shelton Referring Physician/Provider: Sallyann Normie HERO, MD Date: 01/28/2024 Duration: 22.29 mins Patient history: 78yo M with ams. EEG to evaluate for seizure Level of alertness: Awake AEDs during EEG study: None Technical aspects: This EEG study was done with scalp electrodes positioned according to the 10-20 International system of electrode placement. Electrical activity was reviewed with band pass filter of 1-70Hz , sensitivity of 7 uV/mm, display speed of 79mm/sec with a 60Hz  notched filter applied as appropriate. EEG data were recorded continuously and digitally stored.  Video monitoring was available and reviewed as appropriate. Description:  EEG showed continuous generalized 3 to 6 Hz theta-delta slowing. Hyperventilation and photic stimulation were not performed.   ABNORMALITY - Continuous slow,  generalized IMPRESSION: This study is suggestive of generalized cerebral dysfunction (encephalopathy). No seizures or epileptiform discharges were seen throughout the recording. Arlin KIDD Shelton   CT HEAD CODE STROKE WO CONTRAST Result Date: 01/28/2024 EXAM: CT HEAD WITHOUT CONTRAST 01/28/2024 01:56:04 PM TECHNIQUE: CT of the head was performed without the administration of intravenous contrast. Automated exposure control, iterative reconstruction, and/or weight based adjustment of the mA/kV was utilized to reduce the radiation dose to as low as reasonably achievable. COMPARISON: CT head 01/16/2024 and CTA head and neck 01/19/2024. CLINICAL HISTORY: Neuro deficit, acute, stroke suspected. FINDINGS: BRAIN AND VENTRICLES: No acute hemorrhage. No evidence of acute infarct. Nonspecific hypoattenuation in the periventricular and subcortical white matter, most likely representing chronic microvascular ischemic changes. Remote lacunar infarcts in the bilateral thalami and bilateral basal ganglia. Atherosclerosis of the carotid siphons and intracranial vertebral arteries. No hydrocephalus. No extra-axial collection. No mass effect or midline shift. ORBITS: Bilateral lens replacement. No acute abnormality. SINUSES: No acute abnormality. SOFT TISSUES AND SKULL: No acute soft tissue abnormality. No skull fracture. [place] stroke program early CT (aspect) score: Ganglionic (caudate, ic, lentiform nucleus, insula, M1-m3): 7 Supraganglionic (m4-m6): 3 Total: 10 IMPRESSION: 1. No acute intracranial abnormality. 2. Chronic microvascular ischemic changes. 3. Remote lacunar infarcts in the bilateral thalami and basal ganglia. 4. Findings messaged to Dr. Sallyann at 2:02PM on 01/28/24. Electronically signed by: Donnice Mania MD 01/28/2024 02:02 PM EDT RP Workstation: [id number]   CT ANGIO HEAD NECK W WO CM Result Date: 01/19/2024 CLINICAL DATA:  Neuro deficit, acute stroke suspected EXAM: CT ANGIOGRAPHY HEAD AND NECK WITH AND WITHOUT  CONTRAST TECHNIQUE: Multidetector CT imaging of the head and neck was performed using the standard protocol during bolus administration of intravenous contrast. Multiplanar CT image reconstructions and MIPs were obtained to evaluate the vascular anatomy. Carotid stenosis measurements (when applicable) are obtained utilizing NASCET criteria, using the distal internal carotid diameter as the denominator. RADIATION DOSE REDUCTION: This exam was performed according to the departmental dose-optimization program which includes automated exposure control, adjustment of  the mA and/or kV according to patient size and/or use of iterative reconstruction technique. CONTRAST:  75mL OMNIPAQUE  IOHEXOL  350 MG/ML SOLN COMPARISON:  CT head 01/16/2024, MRI 01/18/2024 FINDINGS: CT HEAD: There is low-attenuation in the cerebral white matter. There are old lacunar infarcts in both sides of the thalamus. There is no hemorrhage. No acute ischemic changes. No mass lesion. The ventricles are normal. Skull/sinuses/orbits: No significant abnormality. CTA NECK: CTA NECK Aortic arch: No proximal vessel stenosis. Right carotid: Normal Left carotid: Small amount of calcified plaque at the bifurcation with no stenosis Right vertebral: Normal Left vertebral: There is some calcification at the origin. Otherwise normal Soft tissues: No significant abnormality Other comments: None CTA HEAD: CTA HEAD Right anterior circulation: The internal carotid artery is patent without significant stenosis. The anterior and middle cerebral arteries are patent without significant stenosis or proximal branch occlusion. No aneurysm. Left anterior circulation: The internal carotid artery is patent without significant stenosis. The anterior and middle cerebral arteries are patent without significant stenosis or proximal branch occlusion. No aneurysm. Posterior circulation: Both vertebral arteries are patent. There is no significant basilar stenosis. Both posterior  cerebral arteries are patent. There is a severe stenosis of the right posterior cerebral artery. IMPRESSION: 1. Old bilateral thalamic lacunar infarcts and chronic white matter disease 2. No carotid stenosis 3. No basilar stenosis 4. Severe right posterior cerebral artery stenosis Electronically Signed   By: Nancyann Burns M.D.   On: 01/19/2024 11:38   MR Cervical Spine W and Wo Contrast Result Date: 01/18/2024 EXAM: MRI CERVICAL SPINE WITH AND WITHOUT CONTRAST 01/18/2024 12:44:00 PM TECHNIQUE: Multiplanar multisequence MRI of the cervical spine was performed without and with the administration of 8 mL gadobutrol  (GADAVIST ) 1 MMOL/ML injection. COMPARISON: Cervical spine CT 05/09/2023. 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 FINDINGS: BONES AND ALIGNMENT: Improved cervical lordosis compared to 05/09/2023. Normal vertebral body heights. Normal background bone marrow signal. No marrow edema. No abnormal enhancement. Age appropriate cervical spine degeneration. No degenerative cervical spinal stenosis. SPINAL CORD: No convincing spinal cord signal abnormality. No abnormal intradural enhancement. No dural thickening. SOFT TISSUES: No paraspinal mass. Trace retained secretions in the visible pharynx. Preserved major vascular flow voids in the neck. No significant spinal canal stenosis. IMPRESSION: 1. Age-appropriate cervical spine degeneration without significant spinal stenosis. 2. Visible spinal cord within normal limits. 3. See Brain MRI reported separately today. Electronically signed by: Helayne Hurst MD 01/18/2024 01:38 PM EDT RP Workstation: [id number]   MR Brain Wo Contrast (neuro protocol) Result Date: 01/18/2024 EXAM: MR Brain without Intravenous Contrast. CLINICAL HISTORY: Patient presents with acute neuro deficit, stroke suspected. TECHNIQUE: Magnetic resonance images of the brain without intravenous contrast in multiple planes. CONTRAST: Without. COMPARISON: Head CT  01/16/2024, Brain MRI 05/08/2023. FINDINGS: BRAIN: 7 mm focus of restricted diffusion in the right pons (series 2 image 15), indicating acute infarction. Punctate petechial hemorrhage is present, with pontine hemosiderin increased on series 7 image 28 since January, but no malignant hemorrhagic transformation. No mass effect. No other diffusion restriction. Advanced chronic small vessel disease with stable chronic lacunar infarcts in the bilateral cerebral white matter, right lentiform nucleus, and bilateral thalami. Occasional chronic microhemorrhages. No midline shift or extra-axial fluid collection. No cerebellar tonsillar ectopia. The central arterial and venous flow voids are patent. VENTRICLES: No hydrocephalus. ORBITS: The orbits are normal. SINUSES AND MASTOIDS: The sinuses and mastoid air cells are clear. BONES: No acute fracture or focal osseous lesion. IMPRESSION: 1. Acute right pontine  infarct (7 mm) with possible punctate petechial hemorrhage, but No malignant hemorrhagic transformation and no mass effect. 2. No additional acute findings. Underlying Advanced chronic small vessel disease. Electronically signed by: Helayne Hurst MD 01/18/2024 01:34 PM EDT RP Workstation: [id number]   CT Head Wo Contrast Result Date: 01/16/2024 CLINICAL DATA:  Neuro deficit, acute, stroke suspected Worsening left-sided weakness EXAM: CT HEAD WITHOUT CONTRAST TECHNIQUE: Contiguous axial images were obtained from the base of the skull through the vertex without intravenous contrast. RADIATION DOSE REDUCTION: This exam was performed according to the departmental dose-optimization program which includes automated exposure control, adjustment of the mA and/or kV according to patient size and/or use of iterative reconstruction technique. COMPARISON:  CT head 12/19/2023, CT head 12/13/2023 FINDINGS: Brain: Patchy and confluent areas of decreased attenuation are noted throughout the deep and periventricular white matter of the  cerebral hemispheres bilaterally, compatible with chronic microvascular ischemic disease. Redemonstration of bilateral chronic basal ganglia lacunar infarctions. No evidence of large-territorial acute infarction. No parenchymal hemorrhage. No mass lesion. No extra-axial collection. No mass effect or midline shift. No hydrocephalus. Basilar cisterns are patent. Vascular: No hyperdense vessel. Atherosclerotic calcifications are present within the cavernous internal carotid and vertebral arteries. Skull: No acute fracture or focal lesion. Sinuses/Orbits: Paranasal sinuses and mastoid air cells are clear. Bilateral lens replacement. Otherwise the orbits are unremarkable. Other: None. IMPRESSION: No acute intracranial abnormality. Electronically Signed   By: Morgane  Naveau M.D.   On: 01/16/2024 18:36   DG Chest Port 1 View Result Date: 01/16/2024 CLINICAL DATA:  Weakness EXAM: PORTABLE CHEST - 1 VIEW COMPARISON:  12/13/2023 FINDINGS: Low lung volumes. No focal airspace consolidation, pleural effusion, or pneumothorax. No cardiomegaly. Left chest pacemaker with leads terminating in the right atrium and right ventricle. Aortic atherosclerosis. No acute fracture or destructive lesions. Multilevel thoracic osteophytosis. IMPRESSION: Low lung volumes.  Otherwise, no acute cardiopulmonary abnormality. Electronically Signed   By: Rogelia Myers M.D.   On: 01/16/2024 16:15   CUP PACEART REMOTE DEVICE CHECK Result Date: 01/04/2024 PPM Scheduled remote reviewed. Normal device function.  Presenting rhythm: AS/VS 1 AMS EGM, 4sec in duration, EGM c/w atrial driven tachycardia Next remote 91 days. LA, CVRS   Labs:  Basic Metabolic Panel: Recent Labs  Lab 01/24/24 1341 01/25/24 0523 01/26/24 0516 01/28/24 1411 01/29/24 0256 01/30/24 0250 01/31/24 0759  NA  --  138 135 137 137 137 137  K  --  3.9 3.9 4.1 3.9 3.2* 3.8  CL  --  102 101 102 103 105 105  CO2  --  27 23 15* 23 24 25   GLUCOSE 396* 174* 281* 154*  115* 236* 156*  BUN  --  24* 20 32* 26* 19 11  CREATININE  --  0.84 0.77 1.19 0.91 0.76 0.67  CALCIUM   --  9.9 9.4 9.7 9.2 8.9 9.2  MG  --   --   --   --  1.4* 2.0 1.5*  PHOS  --   --   --   --   --   --  2.2*    CBC: Recent Labs  Lab 01/25/24 0523 01/26/24 0516 01/29/24 0256 01/30/24 0250 01/31/24 0759  WBC 8.1   < > 11.2* 6.0 7.1  NEUTROABS 4.8  --   --   --   --   HGB 12.4*   < > 11.3* 10.8* 11.6*  HCT 36.7*   < > 33.8* 32.1* 34.5*  MCV 87.6   < > 89.2 89.2 87.8  PLT 233   < > 254 228 253   < > = values in this interval not displayed.    CBG: Recent Labs  Lab 01/30/24 1117 01/30/24 1554 01/30/24 2036 01/31/24 0611 01/31/24 1143  GLUCAP 265* 251* 198* 127* 165*    Brief HPI:   John [name] is a 78 y.o. male R handed  male with PMHx of HTN, HLD, DM type 2, history of CVA, cognitive deficits, CAD, heart block s/p PPM, chronic HFrEF, provoked DVT (no longer anticoagulated), BPH, urinary retention with foley catheter, and recent admissions for UTI, bacteremia, pneumonia, nausea and vomiting, recently discharge to SNF who presented  presented to [facility] on 01/16/2024, with complaints of left-sided weakness. An initial CT scan of the head showed no acute abnormalities, and a chest X-ray was negative. Urinalysis was abnormal with a urine culture positive for ESBL E. Coli, and the patient received IV meropenem that was to transition to fosfomycin.   Initial lab results were significant for a glucose level of 164 mg/dL, procalcitonin of 534 ng/mL, WBC of 8.5 x 10^9/L, and hemoglobin of 10 g/dL. An EKG showed junctional rhythm with right bundle branch block. Neurology was consulted for further evaluation. The patient was transferred to [facility] on 01/18/2024. An MRI was positive for a right pontine infarct with a possible punctate petechial hemorrhage. A CTA of the head and neck showed severe right posterior cerebral artery stenosis. Neurology felt this was a  lacunar infarct and recommended to continue aspirin  and Plavix  for 3 weeks then aspirin  alone.Patient continues with Foley catheter.   Prior to hospital admission, the patient required assistance with ADLs and had impaired functional cognition. The patient was residing in the [facility] skilled facility. Upon transfer, the patient required minimal assistance with ADLs and moderate assistance+2  for physical assistance for mobility and transfers, specifically needing manual assistance to stand from the edge of the bed and steadying with manual assistance for placement of the left foot. Therapy evaluations completed due to patient decreased functional mobility was admitted for a comprehensive rehab program.       Inpatient Rehabilitation Course: John [name] was admitted to rehab 01/24/2024 for inpatient therapies to consist of PT, ST and OT at least three hours five days a week. Past admission physiatrist, therapy team and rehab RN have worked together to provide customized collaborative inpatient rehab.  John [name], a 78 year old male, experienced a significant drop in blood pressure, with systolic levels reported in the seventies, and became increasingly unresponsive during his stay. A rapid response was initiated due to hypotension and altered mental status. Normal saline fluids were administered, and his blood pressure improved to 150/52, but he remained obtunded. Initial evaluation showed a capillary blood glucose of 158. While the patient was able to grasp hands upon cues and responded to sternal rub, there was no eye opening, although pupils were reactive to light. A code stroke was initially called, with orders for a stat head CT, comprehensive metabolic panel, CBC, CK, troponin, and EKG. He was placed on 2 liters of oxygen and received supportive care. Neurology was consulted and noted hypotension, but the CT scan showed no acute process. The presentation was consistent with encephalopathy,  leading to the cancellation of the code stroke and plans to conduct an EEG to evaluate for seizures and a toxic metabolic encephalopathy panel. His inpatient rehab stay was interrupted by this episode, resulting in a transfer to acute care.    Rehab course: During  patient's stay in rehab weekly team conferences were held to monitor patient's progress, set goals and discuss barriers to discharge. At admission, patient required minimal assistance with ADLs and moderate assistance+2  for physical assistance for mobility and transfers, specifically needing manual assistance to stand from the edge of the bed and steadying with manual assistance for placement of the left foot.  The patient's ongoing rehabilitation course was disrupted due to the acute episode, and further details on progress in physical therapy (PT), occupational therapy (OT), or speech-language pathology (SLP) therapies were not documented in this transcript. The focus shifted to managing the acute medical concerns, delaying the continuation of rehabilitation efforts.  Disposition: Discharge disposition: 02-Transferred to [facility]         Allergies as of 01/28/2024       Reactions   Ramipril Anaphylaxis   Adhesive [tape] Rash   Other Diarrhea   Severe intolerance to Chemotherapy in the past.   Sertraline Other (See Comments)   Extreme headaches        Medication List     ASK your doctor about these medications    acetaminophen  325 MG tablet Commonly known as: TYLENOL  Take 2 tablets (650 mg total) by mouth every 6 (six) hours as needed for mild pain (pain score 1-3) or fever (or Fever >/= 101).   aspirin  EC 81 MG tablet Take 1 tablet (81 mg total) by mouth daily. Swallow whole.   atorvastatin  80 MG tablet Commonly known as: LIPITOR  Take 1 tablet (80 mg total) by mouth at bedtime.   bisacodyl  10 MG suppository Commonly known as: DULCOLAX Place 10 mg rectally daily as needed for moderate  constipation.   clopidogrel  75 MG tablet Commonly known as: PLAVIX  Take 1 tablet (75 mg total) by mouth daily with breakfast for 21 days.   clotrimazole -betamethasone  cream Commonly known as: LOTRISONE  Apply 1 Application topically daily.   cyanocobalamin  1000 MCG tablet Commonly known as: VITAMIN B12 Take 1 tablet (1,000 mcg total) by mouth daily.   escitalopram  10 MG tablet Commonly known as: Lexapro  Take 1 tablet (10 mg total) by mouth daily.   FeroSul 325 (65 Fe) MG tablet Generic drug: ferrous sulfate  Take 1 tablet (325 mg total) by mouth daily with breakfast.   finasteride  5 MG tablet Commonly known as: PROSCAR  Take 1 tablet (5 mg total) by mouth daily. *Need appointment for future refills.*   hyoscyamine  0.125 MG SL tablet Commonly known as: LEVSIN  SL Place 1 tablet (0.125 mg total) under the tongue every 4 (four) hours as needed for bladder spasms.   insulin  aspart 100 UNIT/ML injection Commonly known as: novoLOG  Inject 3 Units into the skin 3 (three) times daily with meals.   insulin  aspart 100 UNIT/ML injection Commonly known as: novoLOG  Inject 0-9 Units into the skin 3 (three) times daily with meals. Sliding scale insulin  Less than 70 initiate hypoglycemia protocol 70-120  0 units 120-150 1 unit 151-200 2 units 201-250 3 units 251-300 5 units 301-350 7 units 351-400 9 units  Greater than 400 call MD   insulin  glargine 100 UNIT/ML injection Commonly known as: LANTUS  Inject 0.15 mLs (15 Units total) into the skin daily.   losartan  25 MG tablet Commonly known as: COZAAR  Take 1 tablet (25 mg total) by mouth daily.   magnesium  hydroxide 400 MG/5ML suspension Commonly known as: MILK OF MAGNESIA Take 30 mLs by mouth daily as needed for mild constipation.   metFORMIN  500 MG tablet Commonly known as: GLUCOPHAGE  Take 1  tablet (500 mg total) by mouth 2 (two) times daily with a meal.   multivitamin tablet Take 1 tablet by mouth 2 (two) times a week.    ondansetron  4 MG disintegrating tablet Commonly known as: ZOFRAN -ODT Take 1 tablet (4 mg total) by mouth every 8 (eight) hours as needed for nausea or vomiting.   pantoprazole  40 MG tablet Commonly known as: PROTONIX  Take 1 tablet (40 mg total) by mouth at bedtime.   polyethylene glycol 17 g packet Commonly known as: MIRALAX  / GLYCOLAX  Take 17 g by mouth daily.   PRESCRIPTION MEDICATION Place 1 suppository rectally daily as needed (Constipation).   Stool Softener/Laxative 50-8.6 MG tablet Generic drug: senna-docusate Take 1 tablet by mouth 2 (two) times daily.   vitamin C 1000 MG tablet Take 1,000 mg by mouth 2 (two) times a week.         Signed: Daphne LOISE Satterfield 01/31/2024, 12:43 PM

## 2024-01-31 NOTE — Progress Notes (Signed)
 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  Reportedly normal in the morning but BP dropped to 56/44 and became hypotensive in the afternoon.  BP improved after IV fluid and he started to begin to respond to pain stimuli.  Initially code stroke activated and he was evaluated by neurology who called off code stroke suspecting encephalopathy rather than CVA.  In ED, slightly tachycardic and tachypneic.  Slightly hyperglycemic with bicarb of 15 and anion gap of 20.  WBC 12.4.  Lactic acid 3.6.  Serial troponin and CK negative.  EKG with mild sinus tachycardia, LAD, RBBB and age-indeterminate lateral and inferior infarcts.  Foley catheter exchanged.  UA with moderate LE and few bacteria.  CXR and CT head without acute finding.  Patient was started on broad-spectrum antibiotics due to SIRS with lactic acidosis.  Cultures and CT abdomen and pelvis ordered.  CT suggested RLL bronchopneumonia with possible aspiration, T12 compression fracture with slight interval dorsal buckling and 25% height loss, early cirrhosis, clustered small gallstone with mild gallbladder dilation similar to prior studies, enlarged prostate and circumferentially thickened urinary bladder.   Patient refusing SLP evaluation.  Palliative consulted and following.  Blood cultures NGTD.  Urine culture with yeast.  Antibiotic de-escalated to p.o. Augmentin  for aspiration pneumonia. Therapy recommended SNF.  Medically stable for discharge.  Subjective: Seen and examined earlier this  morning.  No major events overnight or this morning.  No complaints other than mild neck pain that he rates 1/10.  Not a great historian.  He is oriented to self, time using wall calendar and [place].   Assessment and plan: Acute metabolic encephalopathy: Likely due to sepsis from possible aspiration pneumonia and hypotension.  CT head without acute finding.  EEG suggested encephalopathy but no seizure.  Has some left-sided weakness likely from prior stroke.  Encephalopathy labs including TSH, ammonia, B12 and RPR unrevealing.  Encephalopathy seems to have improved.  He is sleepy but wakes to voice.  He is oriented to self, place and follows commands. -Reorientation and delirium precaution -Treat treatable causes  Severe sepsis due to aspiration pneumonia: Present on admission.  Tachycardia, tachypnea, leukocytosis, lactic acidosis and hypotension on presentation.  CT concerning for RLL aspiration pneumonia and possible cystitis.  UA with moderate LE and few bacteria.  MRSA PCR nonreactive.  Blood cultures NGTD.  Urine culture with yeast.  Foley catheter exchanged on admission.  UTI ruled out. -Change meropenem to Augmentin  for aspiration pneumonia -Discussed urine culture results with ID, Dr. Fleeta Rothman.  Exchanging Foley sufficient for funguria.  -Aspiration precaution -SLP eval-patient refused MBS. -Palliative medicine following.  Cognitive impairment with delirium: Awake and alert but only oriented to self and time Anxiety and depression: Stable -Started on low-dose Seroquel  at night per wife's request -Continue Lexapro , use delirium precautions  -Palliative consulted per wife's request  IDDM-2 with hyperglycemia: A1c 8.5% on 10/7. Recent Labs  Lab 01/30/24 1117 01/30/24 1554 01/30/24 2036 01/31/24 0611 01/31/24 1143  GLUCAP 265* 251* 198* 127* 165*  -Continue SSI-sensitive -Consolidated Semglee  from 5 units twice daily to 10  units daily  Normocytic anemia: Relatively  stable Recent Labs    01/01/24 1913 01/16/24 1659 01/17/24 0522 01/18/24 0417 01/25/24 0523 01/26/24 0516 01/28/24 1411 01/29/24 0256 01/30/24 0250 01/31/24 0759  HGB 12.7* 10.0* 11.9* 12.4* 12.4* 12.2* 12.6* 11.3* 10.8* 11.6*  -Monitor intermittently  Hx of CVA: Seems to have residual left-sided weakness.  CT head without acute finding. -Continue ASA, Plavix , Lipitor   -PT/OT-recommended SNF   Chronic HFrEF: TTE in 11/2023 with LVEF of 30 to 35%.  Appears euvolemic on exam.  Not on diuretics - Monitor fluid and respiratory status. -Continue home losartan .   Bladder outlet obstruction with chronic Foley -Foley catheter exchanged on admission -Outpatient follow-up with urology   Hypotension/history of hypertension: Now hypotensive. - Resume home losartan   HAGMA: Likely due to lactic acidosis versus DKA.  Resolved.  Cholelithiasis-seems chronic.  Doubt cholecystitis.  No GI symptoms. - Outpatient follow-up  T12 compression fracture - Supportive care  Hypokalemia/hypomagnesemia -Monitor replenish K and Mg as appropriate  Goal of care: Already DNR.  Recurrent hospitalization.  Poor long-term prognosis. - Palliative following.  Plan to meet with family this evening  Body mass index is 24.82 kg/m.           DVT prophylaxis:  enoxaparin  (LOVENOX ) injection 40 mg Start: 01/29/24 1000  Code Status: DNR Family Communication: Updated patient's wife at bedside yesterday.  None at bedside today Level of care: Telemetry Medical Status is: Inpatient Remains inpatient appropriate because: Severe sepsis due to aspiration pneumonia.   Final disposition: SNF.   55 minutes with more than 50% spent in reviewing records, counseling patient/family and coordinating care.  Consultants:  Palliative medicine  Procedures: None  Microbiology summarized: Blood cultures NGTD Urine culture with yeast  Objective: Vitals:   01/30/24 2330 01/31/24 0512 01/31/24 0734  01/31/24 1145  BP: (!) 152/64 (!) 157/68 (!) 153/69 (!) 158/70  Pulse: 61 60 60 60  Resp:  14 11 16   Temp: 97.6 F (36.4 C) 97.6 F (36.4 C) 97.6 F (36.4 C) 97.6 F (36.4 C)  TempSrc: Oral Oral Oral Oral  SpO2: 98% 98% 98% 98%  Weight:      Height:        Examination:  GENERAL: No apparent distress.  Nontoxic. HEENT: MMM.  Vision and hearing grossly intact.  NECK: Supple.  No apparent JVD.  RESP:  No IWOB.  Fair aeration bilaterally. CVS:  RRR. Heart sounds normal.  ABD/GI/GU: BS+. Abd soft, NTND.  Indwelling Foley.  Clear liquid urine. MSK/EXT:  Moves extremities. No apparent deformity. No edema.  SKIN: no apparent skin lesion or wound NEURO: Awake and alert.  Oriented to self, time and city.  Follows commands. Speech clear. Cranial nerves II-XII intact.  Motor 4/5 in LUE and 3+/5 in LLE.  5/5 in the right.  Light sensation intact in all dermatomes. Patellar reflex symmetric.   PSYCH: Calm. Normal affect.   Sch Meds:  Scheduled Meds:  amoxicillin -clavulanate  1 tablet Oral Q12H   aspirin  EC  81 mg Oral Daily   atorvastatin   80 mg Oral QHS   Chlorhexidine  Gluconate Cloth  6 each Topical Daily   clopidogrel   75 mg Oral Q breakfast   enoxaparin  (LOVENOX ) injection  40 mg Subcutaneous Q24H   escitalopram   10 mg Oral Daily   insulin  aspart  0-5 Units Subcutaneous QHS   insulin  aspart  0-9 Units Subcutaneous TID WC   insulin  glargine-yfgn  10 Units Subcutaneous Daily   pantoprazole   40 mg Oral QHS  QUEtiapine   25 mg Oral QHS   sodium chloride  flush  3 mL Intravenous Q12H   Continuous Infusions:   PRN Meds:.acetaminophen  **OR** acetaminophen , fentaNYL  (SUBLIMAZE ) injection, guaiFENesin , [COMPLETED] lactulose **FOLLOWED BY** lactulose, oxyCODONE , prochlorperazine   Antimicrobials: Anti-infectives (From admission, onward)    Start     Dose/Rate Route Frequency Ordered Stop   01/30/24 1430  amoxicillin -clavulanate (AUGMENTIN ) 875-125 MG per tablet 1 tablet        1  tablet Oral Every 12 hours 01/30/24 1344 02/04/24 0959   01/29/24 2300  vancomycin  (VANCOREADY) IVPB 1500 mg/300 mL  Status:  Discontinued        1,500 mg 150 mL/hr over 120 Minutes Intravenous Every 24 hours 01/28/24 2132 01/29/24 0812   01/29/24 1100  vancomycin  (VANCOCIN ) IVPB 1000 mg/200 mL premix  Status:  Discontinued        1,000 mg 200 mL/hr over 60 Minutes Intravenous Every 12 hours 01/29/24 0812 01/29/24 1259   01/28/24 2300  meropenem (MERREM) 1 g in sodium chloride  0.9 % 100 mL IVPB  Status:  Discontinued        1 g 200 mL/hr over 30 Minutes Intravenous Every 8 hours 01/28/24 2128 01/30/24 1344   01/28/24 2200  vancomycin  (VANCOREADY) IVPB 1750 mg/350 mL        1,750 mg 175 mL/hr over 120 Minutes Intravenous  Once 01/28/24 2119 01/29/24 0152        I have personally reviewed the following labs and images: CBC: Recent Labs  Lab 01/25/24 0523 01/26/24 0516 01/28/24 1411 01/29/24 0256 01/30/24 0250 01/31/24 0759  WBC 8.1 7.4 12.4* 11.2* 6.0 7.1  NEUTROABS 4.8  --   --   --   --   --   HGB 12.4* 12.2* 12.6* 11.3* 10.8* 11.6*  HCT 36.7* 35.9* 39.1 33.8* 32.1* 34.5*  MCV 87.6 88.2 90.5 89.2 89.2 87.8  PLT 233 225 304 254 228 253   BMP &GFR Recent Labs  Lab 01/26/24 0516 01/28/24 1411 01/29/24 0256 01/30/24 0250 01/31/24 0759  NA 135 137 137 137 137  K 3.9 4.1 3.9 3.2* 3.8  CL 101 102 103 105 105  CO2 23 15* 23 24 25   GLUCOSE 281* 154* 115* 236* 156*  BUN 20 32* 26* 19 11  CREATININE 0.77 1.19 0.91 0.76 0.67  CALCIUM  9.4 9.7 9.2 8.9 9.2  MG  --   --  1.4* 2.0 1.5*  PHOS  --   --   --   --  2.2*   Estimated Creatinine Clearance: 84.9 mL/min (by C-G formula based on SCr of 0.67 mg/dL). Liver & Pancreas: Recent Labs  Lab 01/25/24 0523 01/28/24 2329 01/30/24 0250 01/31/24 0759  AST 22 26 26   --   ALT 23 33 28  --   ALKPHOS 91 92 75  --   BILITOT 1.3* 1.5* 1.3*  --   PROT 5.5* 5.7* 4.7*  --   ALBUMIN 2.8* 3.0* 2.4* 2.6*   No results for input(s):  LIPASE, AMYLASE in the last 168 hours. Recent Labs  Lab 01/29/24 0256  AMMONIA 37*   Diabetic: No results for input(s): HGBA1C in the last 72 hours. Recent Labs  Lab 01/30/24 1117 01/30/24 1554 01/30/24 2036 01/31/24 0611 01/31/24 1143  GLUCAP 265* 251* 198* 127* 165*   Cardiac Enzymes: Recent Labs  Lab 01/28/24 1411  CKTOTAL 44*  CKMB 3.1   Recent Labs    01/16/24 1659  PROBNP 465.0*   Coagulation Profile: Recent Labs  Lab 01/28/24 2329  INR 1.0   Thyroid  Function Tests: Recent Labs    01/29/24 0256  TSH 1.073   Lipid Profile: No results for input(s): CHOL, HDL, LDLCALC, TRIG, CHOLHDL, LDLDIRECT in the last 72 hours. Anemia Panel: Recent Labs    01/29/24 0256  VITAMINB12 497   Urine analysis:    Component Value Date/Time   COLORURINE YELLOW 01/28/2024 1845   APPEARANCEUR CLOUDY (A) 01/28/2024 1845   LABSPEC 1.020 01/28/2024 1845   PHURINE 5.0 01/28/2024 1845   GLUCOSEU NEGATIVE 01/28/2024 1845   HGBUR NEGATIVE 01/28/2024 1845   BILIRUBINUR NEGATIVE 01/28/2024 1845   KETONESUR NEGATIVE 01/28/2024 1845   PROTEINUR 30 (A) 01/28/2024 1845   NITRITE NEGATIVE 01/28/2024 1845   LEUKOCYTESUR MODERATE (A) 01/28/2024 1845   Sepsis Labs: Invalid input(s): PROCALCITONIN, LACTICIDVEN  Microbiology: Recent Results (from the past 240 hours)  Culture, blood (Routine X 2) w Reflex to ID Panel     Status: None (Preliminary result)   Collection Time: 01/28/24  6:13 PM   Specimen: BLOOD RIGHT HAND  Result Value Ref Range Status   Specimen Description BLOOD RIGHT HAND  Final   Special Requests   Final    BOTTLES DRAWN AEROBIC ONLY Blood Culture results may not be optimal due to an inadequate volume of blood received in culture bottles   Culture   Final    NO GROWTH 3 DAYS Performed at [facility], [address] [place], [place] [address]    Report Status PENDING  Incomplete  Culture, blood (Routine X 2) w Reflex to ID Panel      Status: None (Preliminary result)   Collection Time: 01/28/24  6:13 PM   Specimen: BLOOD LEFT HAND  Result Value Ref Range Status   Specimen Description BLOOD LEFT HAND  Final   Special Requests   Final    BOTTLES DRAWN AEROBIC ONLY Blood Culture results may not be optimal due to an inadequate volume of blood received in culture bottles   Culture   Final    NO GROWTH 3 DAYS Performed at [facility], [address], [place], [place] [address]    Report Status PENDING  Incomplete  Urine Culture (for pregnant, neutropenic or urologic patients or patients with an indwelling urinary catheter)     Status: Abnormal   Collection Time: 01/28/24  6:45 PM   Specimen: Urine, Catheterized  Result Value Ref Range Status   Specimen Description URINE, CATHETERIZED  Final   Special Requests   Final    NONE Performed at [facility], [address], [place], [place] [address]    Culture 60,000 COLONIES/mL YEAST (A)  Final   Report Status 01/30/2024 FINAL  Final  Remove and replace urinary cath (placed > 5 days) then obtain urine culture from new indwelling urinary catheter.     Status: Abnormal   Collection Time: 01/28/24  8:42 PM   Specimen: Urine, Catheterized  Result Value Ref Range Status   Specimen Description URINE, CATHETERIZED  Final   Special Requests   Final    NONE Performed at [facility], [address], [place], [place] [address]    Culture >=100,000 COLONIES/mL YEAST (A)  Final   Report Status 01/29/2024 FINAL  Final  MRSA Next Gen by PCR, Nasal     Status: None   Collection Time: 01/28/24  9:19 PM   Specimen: Nasal Mucosa; Nasal Swab  Result Value Ref Range Status   MRSA by PCR Next Gen NOT  DETECTED NOT DETECTED Final    Comment: (NOTE) The GeneXpert MRSA Assay (FDA approved for NASAL specimens only), is one component of a comprehensive MRSA colonization surveillance program. It is not intended to diagnose MRSA infection nor to guide or monitor treatment  for MRSA infections. Test performance is not FDA approved in patients less than 65 years old. Performed at [facility], [address] [place], [place] [address]   Culture, blood (x 2)     Status: None (Preliminary result)   Collection Time: 01/28/24 11:29 PM   Specimen: BLOOD RIGHT HAND  Result Value Ref Range Status   Specimen Description BLOOD RIGHT HAND  Final   Special Requests   Final    BOTTLES DRAWN AEROBIC ONLY Blood Culture results may not be optimal due to an inadequate volume of blood received in culture bottles   Culture   Final    NO GROWTH 3 DAYS Performed at [facility], [address] [place], [place] [address]    Report Status PENDING  Incomplete  Culture, blood (x 2)     Status: None (Preliminary result)   Collection Time: 01/28/24 11:29 PM   Specimen: BLOOD RIGHT ARM  Result Value Ref Range Status   Specimen Description BLOOD RIGHT ARM  Final   Special Requests   Final    BOTTLES DRAWN AEROBIC ONLY Blood Culture results may not be optimal due to an inadequate volume of blood received in culture bottles   Culture   Final    NO GROWTH 3 DAYS Performed at [facility], [address] [place], [place] [address]    Report Status PENDING  Incomplete    Radiology Studies: No results found.     Bilal Manzer T. Kamisha Ell [facility]  If 7PM-7AM, please contact night-coverage [email or web address] 01/31/2024, 2:31 PM

## 2024-01-31 NOTE — Progress Notes (Addendum)
 Inpatient Rehab Admissions Coordinator:   Pt transferred from CIR to acute over the weekend.  PT eval yesterday recommending SNF for low tolerance.  Will see how he does with OT today.    1400: Consult placed and I will f/u with wife re: expectations and caregiver support available.    Reche Lowers, PT, DPT Admissions Coordinator [phone] 01/31/24  9:13 AM

## 2024-01-31 NOTE — NC FL2 (Signed)
 [place]  MEDICAID FL2 LEVEL OF CARE FORM     IDENTIFICATION  Patient Name: John [name] Birthdate: 11/29/1945 Sex: male Admission Date (Current Location): 01/28/2024  [facility] and [place] Number:  Producer, television/film/video and Address:  [facility] [place], [address] [place], [place] [address]      Provider Number: [id number]  Attending Physician Name and Address:  Kathrin Mignon DASEN, MD  Relative Name and Phone Number:  Jermine, Bib[place] (Spouse)  [phone] (Mobile)    Current Level of Care: Hospital Recommended Level of Care: Skilled Nursing Facility Prior Approval Number:    Date Approved/Denied:   PASRR Number: [id number]  Discharge Plan: SNF    Current Diagnoses: Patient Active Problem List   Diagnosis Date Noted   Prerenal azotemia 01/28/2024   Unresponsive episode 01/28/2024   Bacterial UTI 01/28/2024   Right pontine stroke (HCC) 01/24/2024   Acute cystitis without hematuria 01/17/2024   History of CVA (cerebrovascular accident) 01/17/2024   Weakness 01/17/2024   Left-sided weakness 01/16/2024   Intractable nausea and vomiting 12/26/2023   Constipation 12/26/2023   AMS (altered mental status) 12/13/2023   Altered mental status 12/09/2023   Nausea & vomiting 12/09/2023   Status post placement of cardiac pacemaker 12/09/2023   Sepsis (HCC) 12/04/2023   CAD S/P percutaneous coronary angioplasty 12/04/2023   HFrEF (heart failure with reduced ejection fraction) (HCC) 08/17/2023   DVT, femoral, chronic (HCC) 08/15/2023   Bladder outlet obstruction 08/15/2023   Sepsis due to Escherichia coli (HCC) 08/15/2023   Debility 08/13/2023   Non-ST elevation (NSTEMI) myocardial infarction (HCC) 08/10/2023   Cholelithiasis 08/07/2023   VTE (venous thromboembolism) 08/07/2023   Lactic acidosis 08/07/2023   Sacral pressure ulcer 08/07/2023   Streptococcal bacteremia 08/07/2023   B12 deficiency 08/07/2023   Iron deficiency anemia 08/07/2023   Severe  sepsis (HCC) 08/06/2023   Chronic diastolic CHF (congestive heart failure) (HCC) 08/06/2023   Bacteriuria 08/06/2023   Acute encephalopathy 08/06/2023   Essential hypertension 06/15/2023   Situational depression 06/15/2023   Protein-calorie malnutrition, severe 05/12/2023   Heart block AV complete (HCC) 05/12/2023   Syncope 05/10/2023   Fall at home, initial encounter 05/08/2023   Hypotension 05/08/2023   Abnormal LFTs 05/08/2023   Anemia 05/08/2023   Coping style affecting medical condition 05/02/2023   Acute renal failure superimposed on chronic kidney disease 04/28/2023   Brainstem infarct, acute (HCC) 04/25/2023   Pressure injury of skin 04/25/2023   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   Acute kidney injury superimposed on chronic kidney disease 04/17/2023   DKA (diabetic ketoacidosis) (HCC) 04/16/2023   Type 2 diabetes mellitus with hyperglycemia, with long-term current use of insulin  (HCC) 07/26/2022   Bronchitis 03/20/2021   Acute non-recurrent frontal sinusitis 03/20/2021   Viral URI with cough 01/13/2021   Hyperlipidemia 11/21/2020   Dizziness and giddiness 10/20/2020   Localized swelling of both lower extremities 10/20/2020   Tendinitis of right hip flexor 10/20/2020   Aortic atherosclerosis 09/10/2020   Insomnia 09/10/2020   Cognitive impairment 08/18/2020   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   Urinary retention 06/04/2020   History of COVID-19 12/10/2019   Physical deconditioning 12/10/2019   Fatigue 12/10/2019   Unsteady gait 12/10/2019   Pneumonia due to COVID-19 virus 10/26/2019   Peripheral neuropathy 10/17/2019   Depression, major, single episode, complete remission 10/17/2019   History of breast cancer    GERD (gastroesophageal reflux disease)    BPH (benign prostatic hyperplasia)    CTS (carpal  tunnel syndrome)    Diabetic polyneuropathy associated with type 2 diabetes mellitus (HCC) 09/29/2019   Neuropathic pain of foot 09/29/2019    Foley catheter in place 01/03/2019   Peripheral sensory neuropathy 05/17/2018   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   Hypomagnesemia 01/18/2018   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) 12/13/2017   Axillary adenopathy 12/09/2017   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   S/P cataract extraction and insertion of intraocular lens 01/17/2017   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   PVD (posterior vitreous detachment), left 01/07/2017   Hyperopia of both eyes with astigmatism 11/09/2016   Class 1 obesity due to excess calories without serious comorbidity with body mass index (BMI) of 31.0 to 31.9 in adult 09/17/2016   Encounter for monitoring tamoxifen  therapy 03/05/2016   Insulin  dependent type 2 diabetes mellitus (HCC) 07/02/2015   Diabetes 1.5, 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   Palpitation 09/16/2014    Orientation RESPIRATION BLADDER Height & Weight     Self, Time, Place  Normal Continent, Indwelling catheter Weight: 182 lb 15.7 oz (83 kg) Height:  6' (182.9 cm)  BEHAVIORAL SYMPTOMS/MOOD NEUROLOGICAL BOWEL NUTRITION STATUS      Incontinent Diet (Please see discharge summary)  AMBULATORY STATUS COMMUNICATION OF NEEDS Skin   Extensive Assist Verbally Other (Comment) (Wound - Irritant Contact Dermatitis Sacrum Bilateral)                       Personal Care Assistance Level of Assistance  Bathing, Feeding, Dressing Bathing Assistance: Maximum assistance Feeding assistance: Limited assistance Dressing Assistance: Maximum assistance     Functional Limitations Info  Sight, Speech, Hearing Sight Info: Impaired Hearing Info: Impaired Speech Info: Adequate    SPECIAL CARE FACTORS FREQUENCY  PT (By licensed PT), OT (By licensed OT)     PT Frequency: 5x week OT Frequency: 5x week            Contractures Contractures Info: Not present    Additional Factors Info  Code Status, Allergies,  Isolation Precautions, Insulin  Sliding Scale, Psychotropic Code Status Info: DNR limited Allergies Info: Ramipril, Adhesive (Tape), Other, Sertraline Psychotropic Info: SEROQUEL , lexapro  Insulin  Sliding Scale Info: Please see discharge summary Isolation Precautions Info: Contact pre     Current Medications (01/31/2024):  This is the current hospital active medication list Current Facility-Administered Medications  Medication Dose Route Frequency Provider Last Rate Last Admin   acetaminophen  (TYLENOL ) tablet 650 mg  650 mg Oral Q6H PRN Opyd, Timothy S, MD       Or   acetaminophen  (TYLENOL ) suppository 650 mg  650 mg Rectal Q6H PRN Opyd, Timothy S, MD   650 mg at 01/28/24 2252   amoxicillin -clavulanate (AUGMENTIN ) 875-125 MG per tablet 1 tablet  1 tablet Oral Q12H Gonfa, Taye T, MD   1 tablet at 01/31/24 0841   aspirin  EC tablet 81 mg  81 mg Oral Daily Opyd, Timothy S, MD   81 mg at 01/31/24 0841   atorvastatin  (LIPITOR ) tablet 80 mg  80 mg Oral QHS Opyd, Timothy S, MD   80 mg at 01/30/24 2038   Chlorhexidine  Gluconate Cloth 2 % PADS 6 each  6 each Topical Daily Opyd, Timothy S, MD   6 each at 01/31/24 0842   clopidogrel  (PLAVIX ) tablet 75 mg  75 mg Oral Q breakfast Hammons, Kimberly B, RPH   75 mg at 01/31/24 0841   enoxaparin  (LOVENOX ) injection 40  mg  40 mg Subcutaneous Q24H Opyd, Timothy S, MD   40 mg at 01/31/24 9158   escitalopram  (LEXAPRO ) tablet 10 mg  10 mg Oral Daily Opyd, Timothy S, MD   10 mg at 01/31/24 0841   fentaNYL  (SUBLIMAZE ) injection 12.5 mcg  12.5 mcg Intravenous Q2H PRN Gonfa, Taye T, MD       guaiFENesin  (ROBITUSSIN) 100 MG/5ML liquid 5 mL  5 mL Oral Q4H PRN Opyd, Timothy S, MD       insulin  aspart (novoLOG ) injection 0-5 Units  0-5 Units Subcutaneous QHS Opyd, Timothy S, MD       insulin  aspart (novoLOG ) injection 0-9 Units  0-9 Units Subcutaneous TID WC Gonfa, Taye T, MD   2 Units at 01/31/24 1234   insulin  glargine-yfgn (SEMGLEE ) injection 10 Units  10 Units  Subcutaneous Daily Gonfa, Taye T, MD   10 Units at 01/31/24 0842   lactulose (CHRONULAC) 10 GM/15ML solution 20 g  20 g Oral BID PRN Gonfa, Taye T, MD       oxyCODONE  (Oxy IR/ROXICODONE ) immediate release tablet 2.5-5 mg  2.5-5 mg Oral Q4H PRN Opyd, Timothy S, MD       pantoprazole  (PROTONIX ) EC tablet 40 mg  40 mg Oral QHS Opyd, Timothy S, MD   40 mg at 01/30/24 2038   prochlorperazine  (COMPAZINE ) injection 10 mg  10 mg Intravenous Q4H PRN Opyd, Timothy S, MD   10 mg at 01/28/24 2254   QUEtiapine  (SEROQUEL ) tablet 25 mg  25 mg Oral QHS Gonfa, Taye T, MD   25 mg at 01/30/24 2038   sodium chloride  flush (NS) 0.9 % injection 3 mL  3 mL Intravenous Q12H Opyd, Timothy S, MD   3 mL at 01/31/24 9157     Discharge Medications: Please see discharge summary for a list of discharge medications.  Relevant Imaging Results:  Relevant Lab Results:   Additional Information SSN [id number] [place], [place]

## 2024-01-31 NOTE — Progress Notes (Signed)
 Speech Language Pathology Note  Patient Details  Name: John [name] MRN: [id number] Date of Birth: 02-May-1945 Today's Date: 01/31/2024                                                                                                                 Speech Therapy Discharge Note  This patient was unable to complete the inpatient rehab program due to transfer back to acute care ; therefore, the patient did not meet their long term goals and has been discharged from skilled SLP services at this time.The patient left the program at a maxA assist level for overall cognitive functioning. See CareTool for functional status details.  If the patient is able to return to inpatient rehabilitation within 3 midnights, this may be considered an interrupted stay and therapy services will resume as ordered. Modification and reinstatement of their goals will be made upon completion of therapy service reevaluations.     [name] 01/31/2024, 8:00 AM

## 2024-01-31 NOTE — Progress Notes (Signed)
 Occupational Therapy Note  Patient Details  Name: John [name] MRN: [id number] Date of Birth: December 20, 1945  Occupational Therapy Discharge Note  This patient was unable to complete the inpatient rehab program due to change in medical status; therefore did not meet their long term goals. Pt left the program at a Max assist level for their functional ADLs. This patient is being discharged from OT services at this time.  BIMS at time of d/c  Pt unable to complete due to medical status  See CareTool for functional status details.  If the patient is able to return to inpatient rehabilitation within 3 midnights, this may be considered an interrupted stay and therapy services will resume as ordered. Modification and reinstatement of their goals will be made upon completion of therapy service reevaluations.     [name], OTR/L, MSOT  01/31/2024, 12:59 PM

## 2024-01-31 NOTE — Progress Notes (Signed)
 Patient refusing labs at this time. Does not want to be bothered. Will defer to days to retry when patient is awake.

## 2024-02-01 DIAGNOSIS — G9340 Encephalopathy, unspecified: Secondary | ICD-10-CM | POA: Diagnosis not present

## 2024-02-01 LAB — GLUCOSE, CAPILLARY
Glucose-Capillary: 237 mg/dL — ABNORMAL HIGH (ref 70–99)
Glucose-Capillary: 184 mg/dL — ABNORMAL HIGH (ref 70–99)
Glucose-Capillary: 332 mg/dL — ABNORMAL HIGH (ref 70–99)
Glucose-Capillary: 222 mg/dL — ABNORMAL HIGH (ref 70–99)

## 2024-02-01 LAB — CBC
HCT: 33 % — ABNORMAL LOW (ref 39.0–52.0)
Hemoglobin: 11 g/dL — ABNORMAL LOW (ref 13.0–17.0)
MCH: 29.4 pg (ref 26.0–34.0)
MCHC: 33.3 g/dL (ref 30.0–36.0)
MCV: 88.2 fL (ref 80.0–100.0)
Platelets: 267 K/uL (ref 150–400)
RBC: 3.74 MIL/uL — ABNORMAL LOW (ref 4.22–5.81)
RDW: 17.2 % — ABNORMAL HIGH (ref 11.5–15.5)
WBC: 6.9 K/uL (ref 4.0–10.5)
nRBC: 0 % (ref 0.0–0.2)

## 2024-02-01 LAB — RENAL FUNCTION PANEL
Albumin: 2.4 g/dL — ABNORMAL LOW (ref 3.5–5.0)
Anion gap: 9 (ref 5–15)
BUN: 12 mg/dL (ref 8–23)
CO2: 23 mmol/L (ref 22–32)
Calcium: 8.7 mg/dL — ABNORMAL LOW (ref 8.9–10.3)
Chloride: 104 mmol/L (ref 98–111)
Creatinine, Ser: 0.78 mg/dL (ref 0.61–1.24)
GFR, Estimated: >60 mL/min (ref >60)
Glucose, Bld: 272 mg/dL — ABNORMAL HIGH (ref 70–99)
Phosphorus: 2.4 mg/dL — ABNORMAL LOW (ref 2.5–4.6)
Potassium: 3.7 mmol/L (ref 3.5–5.1)
Sodium: 136 mmol/L (ref 135–145)

## 2024-02-01 LAB — MAGNESIUM: Magnesium: 1.6 mg/dL — ABNORMAL LOW (ref 1.7–2.4)

## 2024-02-01 MED ORDER — INSULIN GLARGINE-YFGN 100 UNIT/ML ~~LOC~~ SOLN
15.0000 [IU] | Freq: Every day | SUBCUTANEOUS | Status: DC
  Administered 2024-02-02 – 2024-02-03 (×2): 15 [IU] via SUBCUTANEOUS
  Filled 2024-02-01 (×2): qty 0.15

## 2024-02-01 NOTE — Progress Notes (Signed)
 Progress Note from the Palliative Medicine Team at [facility]   Patient Name: John [name]        Date: 02/02/2024 DOB: [birth year]  Age: 78 y.o. MRN#: [id number] Attending Physician: John Burgess BROCKS, MD Primary Care Physician: John Mabel Mt, DO Admit Date: 01/28/2024   Reason for Consultation/Follow-up   Establishing Goals of Care   HPI/ Brief Hospital Review  78 y.o. 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  Multiple rehospitalization's over the past 6 months  Family face treatment option decisions, directive decisions and anticipatory care needs.   Subjective  Extensive chart review has been completed prior to meeting with patient/family  including labs, vital signs, imaging, progress/consult notes, orders, medications and available advance directive documents.    This NP assessed patient at the bedside, he is alert tolerating meals and reluctantly follows simple commands.  No family at bedside        I was able to speak to wife by telephone.  Ongoing conversation regarding current medical situation.  Ongoing conversation regarding this case versus worst-case scenario regarding disease trajectory, wife continues to verbalize concern over anticipatory care needs.     Recommendation for family meeting, John [name] agrees in a telephone conference call was set for tonight at 7 PM with her 2 children/John [name] and John [name].     Conference call/ 70 minutes was had,  detailed exploration of patient's current medical situation.  Review of multiple co-morbidities and the disease process and trajectory.  Discussed current medical interventions  Values and goals of care important to patient  were attempted to be elicited.  All  family members were able to express patient's stubbornness when it comes to healthcare issues.  Wife reports that his denial of diabetes over the years and failure to comply suggested medical interventions.  All agree with DNR DNI status and the patient would never want a feeding tube  Education was offered  regarding advanced directives.  Concepts specific to code status, artifical feeding and hydration, continued IV antibiotics and rehospitalization was had.  MOST form introduced  Both John [name] and John verbalized their hope for ongoing medical interventions, hoping for improvement and ultimately being able to return home.  Explored best case versus worse case scenario as far as trajectory into the next weeks and months.  Introduced Fish farm manager of adult failure to thrive and the limitations of medical interventions to prolong quality of life when the body does fail to thrive.  All are open to and hopeful for more short-term rehab.  We explored future in-home care needs and consideration for both patient and his wife who is the main caregiver.  The difference between a aggressive medical intervention path  and a palliative comfort care path for this patient, at this time was had.   Education offered on hospice benefit; philosophy and eligibility.   Education offered today regarding  the importance of continued conversation with family and their  medical providers regarding overall plan of care and treatment options,  ensuring decisions are within the context of the patients values and GOCs.      Family will continue to face healthcare decisions within the context of this patient's current medical situation, complex co-morbidities.  Questions and concerns addressed  Discussed with primary team and nursing staff  All are encouraged to call PMD with questions and concerns, contact information given   Time: 90  minutes  Detailed review of medical records ( labs, imaging, vital signs),  medically appropriate exam ( MS, skin, cardiac,  resp)   discussed with treatment team, counseling and education to patient, family, staff, documenting clinical information, medication management, coordination of care    John Plants NP  Palliative Medicine Team Team Phone # [phone] Pager [phone]

## 2024-02-01 NOTE — TOC Progression Note (Signed)
 Transition of Care [facility]) - Progression Note    Patient Details  Name: John [name] MRN: [id number] Date of Birth: [birth year]  Transition of [facility]) CM/SW Contact  Luise JAYSON Pan, [place] Phone Number: 02/01/2024, 3:11 PM  Clinical Narrative:   CSW left patients spouse a voicemail about CIR still following and awaiting PT eval before determining if patient is a candidate for CIR. CSW also stated that of the 3 SNF options she gave, only [facility] SNF can accept.   3:14 PM Per PT, they will recommendation CIR.   CSW will continue to follow.    Expected Discharge Plan: Skilled Nursing Facility Barriers to Discharge: Continued Medical Work up, SNF Pending bed offer, Insurance Authorization               Expected Discharge Plan and Services In-house Referral: Clinical Social Work     Living arrangements for the past 2 months: Single Family Home                                       Social Drivers of Health (SDOH) Interventions SDOH Screenings   Food Insecurity: Patient Unable To Answer (01/29/2024)  Housing: Unknown (01/29/2024)  Transportation Needs: No Transportation Needs (01/29/2024)  Utilities: Not At Risk (01/29/2024)  Alcohol Screen: Low Risk  (01/27/2023)  Depression (PHQ2-9): Low Risk  (01/28/2023)  Financial Resource Strain: Low Risk  (10/03/2023)  Physical Activity: Inactive (10/03/2023)  Social Connections: Unknown (01/29/2024)  Recent Concern: Social Connections - Socially Isolated (12/05/2023)  Stress: No Stress Concern Present (10/03/2023)  Tobacco Use: Low Risk  (01/28/2024)    Readmission Risk Interventions    12/06/2023    4:03 PM 05/11/2023   12:52 PM 04/22/2023   12:12 PM  Readmission Risk Prevention Plan  Medication Screening   Complete  Transportation Screening Complete Complete Complete  PCP or Specialist Appt within 3-5 Days  Complete   HRI or Home Care Consult  Complete   Social Work Consult for Recovery Care  Planning/Counseling  Complete   Palliative Care Screening  Not Applicable   Medication Review Oceanographer) Complete Complete   HRI or Home Care Consult Complete    SW Recovery Care/Counseling Consult Complete    Palliative Care Screening Not Applicable    Skilled Nursing Facility Not Applicable

## 2024-02-01 NOTE — Progress Notes (Signed)
 Pt last blood glucose was 332 pt had a pepsi, pudding, and other miscellaneous snacks from home on his bedside table.

## 2024-02-01 NOTE — Plan of Care (Signed)
  Problem: Nutrition: Goal: Adequate nutrition will be maintained 02/01/2024 1943 by Claudene Rosalba DASEN, RN Outcome: Progressing 02/01/2024 1756 by Claudene Rosalba DASEN, RN Outcome: Progressing   Pt didn't eat breakfast but ate lunch and dinner see percentages in flowsheet.

## 2024-02-01 NOTE — Progress Notes (Signed)
 Physical Therapy Treatment Patient Details Name: John [name] MRN: [id number] DOB: 1946-03-02 Today's Date: 02/01/2024   History of Present Illness 78 y.o. male adm 01/28/24 from [facility] hypotensive and unresponsive with RLL PNA, encephalopathy. PMHx: 10/6-10/14 admission for Lt weakness with Rt pontine infarct.T12 comp fx, HTN, HLD, T2DM, CVA, cognitive deficits, CAD, heart block s/p PPM, HFrEF, DVT, BPH, urinary retention with Foley catheter    PT Comments  Pt with improved mobility today and able to amb short distance in hall with assistance. Patient will benefit from intensive inpatient follow-up therapy, >3 hours/day to maximize independence and safety.     If plan is discharge home, recommend the following: A lot of help with walking and/or transfers;A lot of help with bathing/dressing/bathroom;Assistance with cooking/housework;Direct supervision/assist for medications management;Direct supervision/assist for financial management;Assist for transportation;Help with stairs or ramp for entrance   Can travel by private vehicle        Equipment Recommendations  Wheelchair cushion (measurements PT);Hospital bed;Wheelchair (measurements PT);Rolling walker (2 wheels)    Recommendations for Other Services       Precautions / Restrictions Precautions Precautions: Fall;Back Recall of Precautions/Restrictions: Impaired Precaution/Restrictions Comments: L hemiparesis Restrictions Weight Bearing Restrictions Per Provider Order: No     Mobility  Bed Mobility Overal bed mobility: Needs Assistance Bed Mobility: Rolling, Sidelying to Sit Rolling: Mod assist Sidelying to sit: Mod assist       General bed mobility comments: Assist to elevate trunk and bring hips to EOB    Transfers Overall transfer level: Needs assistance Equipment used: Rolling walker (2 wheels), Ambulation equipment used Transfers: Sit to/from Stand, Bed to chair/wheelchair/BSC Sit to Stand: Mod assist, +2  safety/equipment           General transfer comment: Used Stedy for bed to chair. From chair stood with walker with mod assist to power up and balance. Transfer via Lift Equipment: Stedy  Ambulation/Gait Ambulation/Gait assistance: Mod assist, +2 safety/equipment Gait Distance (Feet): 35 Feet Assistive device: Rolling walker (2 wheels) Gait Pattern/deviations: Step-through pattern, Decreased stride length, Knee flexed in stance - left, Trunk flexed Gait velocity: decr Gait velocity interpretation: <1.31 ft/sec, indicative of household ambulator   General Gait Details: Assist for balance and support and 2nd person following with chair. As fatigued lt knee flexed in stance and lt lateral trunk lean.   Stairs             Wheelchair Mobility     Tilt Bed    Modified Rankin (Stroke Patients Only) Modified Rankin (Stroke Patients Only) Pre-Morbid Rankin Score: Moderately severe disability Modified Rankin: Moderately severe disability     Balance Overall balance assessment: Needs assistance Sitting-balance support: Feet supported, Bilateral upper extremity supported Sitting balance-Leahy Scale: Poor Sitting balance - Comments: min to CGA Postural control: Right lateral lean Standing balance support: Bilateral upper extremity supported Standing balance-Leahy Scale: Poor Standing balance comment: walker and min to mod assist                            Communication Communication Communication: Impaired Factors Affecting Communication: Hearing impaired (Did not verbalize until very very end of session)  Cognition Arousal: Alert Behavior During Therapy: Flat affect   PT - Cognitive impairments: Attention, Difficult to assess, Problem solving, Safety/Judgement                       PT - Cognition Comments: Pt did not verbalize until very end of  session. Slow to process. Following commands: Impaired Following commands impaired: Follows one step  commands with increased time    Cueing Cueing Techniques: Verbal cues, Tactile cues, Visual cues  Exercises      General Comments General comments (skin integrity, edema, etc.): VSS on RA      Pertinent Vitals/Pain Pain Assessment Pain Assessment: No/denies pain    Home Living                          Prior Function            PT Goals (current goals can now be found in the care plan section) Progress towards PT goals: Progressing toward goals;Goals updated    Frequency    Min 2X/week      PT Plan      Co-evaluation              AM-PAC PT 6 Clicks Mobility   Outcome Measure  Help needed turning from your back to your side while in a flat bed without using bedrails?: A Lot Help needed moving from lying on your back to sitting on the side of a flat bed without using bedrails?: A Lot Help needed moving to and from a bed to a chair (including a wheelchair)?: A Lot Help needed standing up from a chair using your arms (e.g., wheelchair or bedside chair)?: A Lot Help needed to walk in hospital room?: A Lot Help needed climbing 3-5 steps with a railing? : Total 6 Click Score: 11    End of Session Equipment Utilized During Treatment: Gait belt Activity Tolerance: Patient tolerated treatment well Patient left: in chair;with call bell/phone within reach;with chair alarm set Nurse Communication: Mobility status PT Visit Diagnosis: Unsteadiness on feet (R26.81);Muscle weakness (generalized) (M62.81);Other abnormalities of gait and mobility (R26.89);Hemiplegia and hemiparesis;Difficulty in walking, not elsewhere classified (R26.2) Hemiplegia - Right/Left: Left Hemiplegia - dominant/non-dominant: Non-dominant Hemiplegia - caused by: Cerebral infarction     Time: 1446-1511 PT Time Calculation (min) (ACUTE ONLY): 25 min  Charges:    $Gait Training: 8-22 mins $Therapeutic Activity: 8-22 mins PT General Charges $$ ACUTE PT VISIT: 1 Visit                      [facility] PT Acute Rehabilitation Services Office [phone]    Rodgers ORN [facility] 02/01/2024, 4:04 PM

## 2024-02-01 NOTE — Plan of Care (Signed)
  Problem: Education: Goal: Knowledge of General Education information will improve Description: Including pain rating scale, medication(s)/side effects and non-pharmacologic comfort measures Outcome: Progressing   Problem: Health Behavior/Discharge Planning: Goal: Ability to manage health-related needs will improve Outcome: Progressing   Problem: Nutrition: Goal: Adequate nutrition will be maintained Outcome: Progressing   Problem: Elimination: Goal: Will not experience complications related to urinary retention Outcome: Progressing   

## 2024-02-01 NOTE — Progress Notes (Signed)
 Speech Language Pathology Treatment: Dysphagia  Patient Details Name: John [name] MRN: [id number] DOB: 05/07/1945 Today's Date: 02/01/2024 Time: 8864-8856 SLP Time Calculation (min) (ACUTE ONLY): 8 min  Assessment / Plan / Recommendation Clinical Impression  Session was brief as pt only agreeable to take one sip thin via straw and would not consume additional liquid and declined Pringles at bedside and graham cracker. No s/s aspiration however cannot assess at bedside and ST has recommended an MBS today and 2 prior sessions. Discussed reasoning for recommendation of MBS and unlike prior session where he acknowledged swallow difficulty, today he denied having any problems. In re: to MBS he stated he thinks it is stupid and I'm not going to do it. I don't have any problems with my swallow. Again he did not give permission for SLP to call and discuss with wife. He has been refusing po's at times nursing. Reiterated swallow precautions with RN. SLP will sign off at this time (notified MD) and if pt decides he will participate in MBS, please reconsult.    HPI HPI: John [name] is a 78 y.o. 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 Pt transferred from inpatient CIR due to decreased responsiveness. Code stroke called, evaluated by neurologist who canceled code stroke suspecting encephalopathy rarther than CVA. CT no acute intracranial abnormality,   chronic microvascular ischemic changes, remote lacunar infarcts in the bilateral thalami and basal ganglia.  Swallow briefly screened on CIR- no changes endorsed by pt not overt s/s difficulty with meal during consumption. Chest CT Right lower lobe bronchopneumonia with possible aspiration component. Wife states was told he had pna on prior admission for sepsis (could not recall when). PMH: hypertension, hyperlipidemia, insulin -dependent diabetes mellitus, history of CVA, CAD, heart block with pacer, chronic  HFrEF, history of DVT no longer anticoagulated, bladder outlet obstruction with Foley catheter, cognitive deficits.      SLP Plan  Discharge SLP treatment due to (comment);Other (Comment) (discussed with RN that ST is signing off due to pt refusal for MBS on 3 attempts)          Recommendations  Diet recommendations: Regular;Thin liquid Liquids provided via: Cup;Straw Medication Administration: Whole meds with liquid Supervision: Patient able to self feed;Full supervision/cueing for compensatory strategies Compensations: Slow rate;Small sips/bites Postural Changes and/or Swallow Maneuvers: Seated upright 90 degrees;Upright 30-60 min after meal                  Oral care BID   Intermittent Supervision/Assistance Dysphagia, unspecified (R13.10)     Discharge SLP treatment due to (comment);Other (Comment) (discussed with RN that ST is signing off due to pt refusal for MBS on 3 attempts)     John [name]  02/01/2024, 11:50 AM

## 2024-02-01 NOTE — Inpatient Diabetes Management (Signed)
 Inpatient Diabetes Program Recommendations  AACE/ADA: New Consensus Statement on Inpatient Glycemic Control (2015)  Target Ranges:  Prepandial:   less than 140 mg/dL      Peak postprandial:   less than 180 mg/dL (1-2 hours)      Critically ill patients:  140 - 180 mg/dL   Lab Results  Component Value Date   GLUCAP 237 (H) 02/01/2024   HGBA1C 8.5 (H) 01/17/2024    Review of Glycemic Control  Latest Reference Range & Units 01/30/24 15:54 01/30/24 20:36 01/31/24 06:11 01/31/24 11:43 01/31/24 15:26 01/31/24 21:06 02/01/24 05:59  Glucose-Capillary 70 - 99 mg/dL 748 (H) 801 (H) 872 (H) 165 (H) 221 (H) 305 (H) 237 (H)  (H): Data is abnormally high Diabetes history: Type 2 DM Outpatient Diabetes medications: Novolog  3 units TID, Novolog  0-9 units TID, Lantus  15 units every day, Metformin  500 mg BID Current orders for Inpatient glycemic control: Semglee  10 units TID, Novolog  0-9 units TID & HS  Inpatient Diabetes Program Recommendations:    Consider further increasing Semglee  to 15 units every day.   Thanks, Tinnie Minus, MSN, RNC-OB Diabetes Coordinator [phone] (8a-5p)

## 2024-02-01 NOTE — Progress Notes (Signed)
 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  CT showed concerns of right lower lobe bronchopneumonia likely from aspiration, T12 compression fracture and early cirrhosis.  Blood cultures are negative, antibiotics have been transitioned to Augmentin .  Patient has refused speech and swallow evaluation.  Ongoing palliative care discussions  Assessment & Plan:   Acute metabolic encephalopathy: Improved This is likely secondary to aspiration pneumonia and hypotension.  CT head negative, EEG showed encephalopathy.  Metabolic workup has mostly been unrevealing.  Home mentation is improved   Severe sepsis due to aspiration right lower lobe pneumonia: Present on admission.   Present on admission.  Initially treated with IV meropenem now transition to p.o. Augmentin .  EOT 10/24.  Patient has declined speech and swallow evaluation/MBS. -Prior provider discussed urine culture results with ID, Dr. Fleeta Rothman.  Exchanging Foley sufficient for funguria.  -Aspiration precaution -Palliative care   Cognitive impairment with delirium:  Awake and alert but only oriented to self and time  Anxiety and depression: Stable -Started on low-dose Seroquel  at night per wife's request -Continue Lexapro , use delirium precautions  -Palliative consulted per wife's request   IDDM-2 with hyperglycemia: A1c 8.5% on 10/7. -Long-acting, sliding scale necessary.   Normocytic anemia: Relatively stable -Hemoglobin stable   Hx of CVA: Seems to have residual left-sided weakness.  CT head without acute  finding. -Continue ASA, Plavix , Lipitor   -PT/OT-recommended SNF   Chronic HFrEF:  -TTE in 11/2023 with LVEF of 30 to 35%.  Appears euvolemic on exam.      Bladder outlet obstruction with chronic Foley -Foley catheter exchanged on admission -Outpatient follow-up with urology   Hypotension/history of hypertension: Now hypotensive. - Resume home losartan , IV as needed   HAGMA: Likely due to lactic acidosis versus DKA.  Resolved.   Cholelithiasis-seems chronic.  Doubt cholecystitis.  No GI symptoms. - Outpatient follow-up   T12 compression fracture - Supportive care   Hypokalemia/hypomagnesemia -Monitor replenish K and Mg as appropriate   Goal of care: Already DNR.  Recurrent hospitalization.  Poor long-term prognosis. - Palliative following.  Plan to meet with family    Body mass index is 24.82 kg/m.  DVT prophylaxis: enoxaparin  (LOVENOX ) injection 40 mg Start: 01/29/24 1000    Code Status: Limited: Do not attempt resuscitation (DNR) -DNR-LIMITED -Do Not Intubate/DNI  Family Communication:   Status is: Inpatient Remains inpatient appropriate because: Ongoing palliative care discussions.   PT Follow up Recs: Skilled Nursing-Short Term Rehab (<3 Hours/Day)01/30/2024 1240  Subjective: Patient declining speech and swallow evaluation He is able to tell me his name, place and today's date.  Examination:  General exam: Appears calm and comfortable  Respiratory system: Clear to auscultation. Respiratory effort normal. Cardiovascular system: S1 & S2 heard, RRR. No JVD, murmurs, rubs, gallops or clicks. No pedal edema. Gastrointestinal system: Abdomen is nondistended, soft and nontender. No organomegaly or masses felt. Normal bowel sounds heard. Central nervous system: Alert and oriented. No focal neurological deficits. Extremities: Symmetric 5 x 5 power. Skin: No rashes, lesions or ulcers Psychiatry: Judgement and insight appear normal. Mood & affect appropriate.  Diet Orders (From admission, onward)     Start     Ordered   01/28/24 2117  Diet regular Room service appropriate? Yes; Fluid consistency: Thin  Diet effective now       Question Answer Comment  Room service appropriate? Yes   Fluid consistency: Thin      01/28/24 2119            Objective: Vitals:   01/31/24 2310 02/01/24 0325 02/01/24 0716 02/01/24 1128  BP: 131/65 (!) 102/59 (!) 140/66   Pulse: 72 67 63   Resp: (!) 23 20 17    Temp: 98.2 F (36.8 C) 97.8 F (36.6 C) 98 F (36.7 C) 98.5 F (36.9 C)  TempSrc: Oral Axillary Oral Oral  SpO2: 95% 94% 97%   Weight:      Height:        Intake/Output Summary (Last 24 hours) at 02/01/2024 1222 Last data filed at 02/01/2024 1221 Gross per 24 hour  Intake 360 ml  Output 1050 ml  Net -690 ml   Filed Weights   01/28/24 2038  Weight: 83 kg    Scheduled Meds:  amoxicillin -clavulanate  1 tablet Oral Q12H   aspirin  EC  81 mg Oral Daily   atorvastatin   80 mg Oral QHS   Chlorhexidine  Gluconate Cloth  6 each Topical Daily   clopidogrel   75 mg Oral Q breakfast   enoxaparin  (LOVENOX ) injection  40 mg Subcutaneous Q24H   escitalopram   10 mg Oral Daily   insulin  aspart  0-5 Units Subcutaneous QHS   insulin  aspart  0-9 Units Subcutaneous TID WC   [START ON 02/02/2024] insulin  glargine-yfgn  15 Units Subcutaneous Daily   losartan   25 mg Oral Daily   pantoprazole   40 mg Oral QHS   QUEtiapine   25 mg Oral QHS   sodium chloride  flush  3 mL Intravenous Q12H   Continuous Infusions:  Nutritional status     Body mass index is 24.82 kg/m.  Data Reviewed:   CBC: Recent Labs  Lab 01/28/24 1411 01/29/24 0256 01/30/24 0250 01/31/24 0759 02/01/24 0232  WBC 12.4* 11.2* 6.0 7.1 6.9  HGB 12.6* 11.3* 10.8* 11.6* 11.0*  HCT 39.1 33.8* 32.1* 34.5* 33.0*  MCV 90.5 89.2 89.2 87.8 88.2  PLT 304 254 228 253 267   Basic Metabolic Panel: Recent Labs  Lab 01/28/24 1411 01/29/24 0256 01/30/24 0250  01/31/24 0759 02/01/24 0232  NA 137 137 137 137 136  K 4.1 3.9 3.2* 3.8 3.7  CL 102 103 105 105 104  CO2 15* 23 24 25 23   GLUCOSE 154* 115* 236* 156* 272*  BUN 32* 26* 19 11 12   CREATININE 1.19 0.91 0.76 0.67 0.78  CALCIUM  9.7 9.2 8.9 9.2 8.7*  MG  --  1.4* 2.0 1.5* 1.6*  PHOS  --   --   --  2.2* 2.4*   GFR: Estimated Creatinine Clearance: 84.9 mL/min (by C-G formula based on SCr of 0.78 mg/dL). Liver Function Tests: Recent Labs  Lab 01/28/24 2329 01/30/24 0250 01/31/24 0759 02/01/24 0232  AST 26 26  --   --   ALT 33 28  --   --   ALKPHOS 92 75  --   --   BILITOT 1.5* 1.3*  --   --   PROT 5.7* 4.7*  --   --   ALBUMIN 3.0* 2.4* 2.6* 2.4*   No results for input(s): LIPASE, AMYLASE in the last 168 hours. Recent Labs  Lab 01/29/24 0256  AMMONIA  37*   Coagulation Profile: Recent Labs  Lab 01/28/24 2329  INR 1.0   Cardiac Enzymes: Recent Labs  Lab 01/28/24 1411  CKTOTAL 44*  CKMB 3.1   BNP (last 3 results) Recent Labs    01/16/24 1659  PROBNP 465.0*   HbA1C: No results for input(s): HGBA1C in the last 72 hours. CBG: Recent Labs  Lab 01/31/24 1143 01/31/24 1526 01/31/24 2106 02/01/24 0559 02/01/24 1126  GLUCAP 165* 221* 305* 237* 184*   Lipid Profile: No results for input(s): CHOL, HDL, LDLCALC, TRIG, CHOLHDL, LDLDIRECT in the last 72 hours. Thyroid  Function Tests: No results for input(s): TSH, T4TOTAL, FREET4, T3FREE, THYROIDAB in the last 72 hours. Anemia Panel: No results for input(s): VITAMINB12, FOLATE, FERRITIN, TIBC, IRON, RETICCTPCT in the last 72 hours. Sepsis Labs: Recent Labs  Lab 01/28/24 1813 01/28/24 2329 01/29/24 0256  PROCALCITON  --  <0.10 <0.10  LATICACIDVEN 3.6* 2.2* 1.2    Recent Results (from the past 240 hours)  Culture, blood (Routine X 2) w Reflex to ID Panel     Status: None (Preliminary result)   Collection Time: 01/28/24  6:13 PM   Specimen: BLOOD RIGHT HAND  Result Value  Ref Range Status   Specimen Description BLOOD RIGHT HAND  Final   Special Requests   Final    BOTTLES DRAWN AEROBIC ONLY Blood Culture results may not be optimal due to an inadequate volume of blood received in culture bottles   Culture   Final    NO GROWTH 4 DAYS Performed at [facility], [address], [place], [place] [address]    Report Status PENDING  Incomplete  Culture, blood (Routine X 2) w Reflex to ID Panel     Status: None (Preliminary result)   Collection Time: 01/28/24  6:13 PM   Specimen: BLOOD LEFT HAND  Result Value Ref Range Status   Specimen Description BLOOD LEFT HAND  Final   Special Requests   Final    BOTTLES DRAWN AEROBIC ONLY Blood Culture results may not be optimal due to an inadequate volume of blood received in culture bottles   Culture   Final    NO GROWTH 4 DAYS Performed at [facility], [address] [place], [place] [address]    Report Status PENDING  Incomplete  Urine Culture (for pregnant, neutropenic or urologic patients or patients with an indwelling urinary catheter)     Status: Abnormal   Collection Time: 01/28/24  6:45 PM   Specimen: Urine, Catheterized  Result Value Ref Range Status   Specimen Description URINE, CATHETERIZED  Final   Special Requests   Final    NONE Performed at [facility], [address], [place], [place] [address]    Culture 60,000 COLONIES/mL YEAST (A)  Final   Report Status 01/30/2024 FINAL  Final  Remove and replace urinary cath (placed > 5 days) then obtain urine culture from new indwelling urinary catheter.     Status: Abnormal   Collection Time: 01/28/24  8:42 PM   Specimen: Urine, Catheterized  Result Value Ref Range Status   Specimen Description URINE, CATHETERIZED  Final   Special Requests   Final    NONE Performed at [facility], [address] [place], [place] [address]    Culture >=100,000 COLONIES/mL YEAST (A)  Final   Report Status 01/29/2024 FINAL  Final  MRSA Next Gen  by PCR, Nasal     Status: None   Collection Time:  01/28/24  9:19 PM   Specimen: Nasal Mucosa; Nasal Swab  Result Value Ref Range Status   MRSA by PCR Next Gen NOT DETECTED NOT DETECTED Final    Comment: (NOTE) The GeneXpert MRSA Assay (FDA approved for NASAL specimens only), is one component of a comprehensive MRSA colonization surveillance program. It is not intended to diagnose MRSA infection nor to guide or monitor treatment for MRSA infections. Test performance is not FDA approved in patients less than 15 years old. Performed at [facility], [address], [place], [place] [address]   Culture, blood (x 2)     Status: None (Preliminary result)   Collection Time: 01/28/24 11:29 PM   Specimen: BLOOD RIGHT HAND  Result Value Ref Range Status   Specimen Description BLOOD RIGHT HAND  Final   Special Requests   Final    BOTTLES DRAWN AEROBIC ONLY Blood Culture results may not be optimal due to an inadequate volume of blood received in culture bottles   Culture   Final    NO GROWTH 4 DAYS Performed at [facility], [address], [place], [place] [address]    Report Status PENDING  Incomplete  Culture, blood (x 2)     Status: None (Preliminary result)   Collection Time: 01/28/24 11:29 PM   Specimen: BLOOD RIGHT ARM  Result Value Ref Range Status   Specimen Description BLOOD RIGHT ARM  Final   Special Requests   Final    BOTTLES DRAWN AEROBIC ONLY Blood Culture results may not be optimal due to an inadequate volume of blood received in culture bottles   Culture   Final    NO GROWTH 4 DAYS Performed at [facility], [address], [place], [place] [address]    Report Status PENDING  Incomplete         Radiology Studies: No results found.         LOS: 4 days   Time spent= 35 mins    Burgess JAYSON Dare, MD [facility]  If 7PM-7AM, please contact night-coverage  02/01/2024, 12:22 PM

## 2024-02-01 NOTE — Hospital Course (Addendum)
 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  CT showed concerns of right lower lobe bronchopneumonia likely from aspiration, T12 compression fracture and early cirrhosis.  Blood cultures are negative, antibiotics have been transitioned to Augmentin .  Patient has refused speech and swallow evaluation.  Seen by palliative care service and decision is for patient to go to CIR.  Assessment & Plan:   Acute metabolic encephalopathy: Improved This is likely secondary to aspiration pneumonia and hypotension.  CT head negative, EEG showed encephalopathy.  Metabolic workup has mostly been unrevealing.  Home mentation is improved   Severe sepsis due to aspiration right lower lobe pneumonia: Present on admission.   Present on admission.  Initially treated with IV meropenem now transition to p.o. Augmentin .  EOT 10/24.  Patient has declined speech and swallow evaluation/MBS. -Prior provider discussed urine culture results with ID, Dr. Fleeta Rothman.  Exchanging Foley sufficient for funguria.  -Aspiration precaution -Palliative care   Cognitive impairment with delirium:  Awake and alert but only oriented to self and time  Anxiety and depression: Stable Continue Lexapro  and delirium precaution - Low-dose Seroquel  started per wife's request by the previous provider   IDDM-2 with hyperglycemia: A1c 8.5% on 10/7. -Long-acting, sliding scale necessary.   Normocytic anemia: Relatively stable -Hemoglobin stable   Hx of CVA: Seems to have residual left-sided weakness.  CT head without acute finding. -Continue ASA, Plavix , Lipitor   -PT/OT-recommended SNF   Chronic HFrEF:  -TTE in 11/2023 with LVEF of 30 to 35%.   Appears euvolemic on exam.      Bladder outlet obstruction with chronic Foley -Foley catheter exchanged on admission -Outpatient follow-up with urology   Hypotension/history of hypertension: Now hypotensive. - Resume home losartan , IV as needed   HAGMA: Likely due to lactic acidosis versus DKA.  Resolved.   Cholelithiasis-seems chronic.  Doubt cholecystitis.  No GI symptoms. - Outpatient follow-up   T12 compression fracture - Supportive care   Hypokalemia/hypomagnesemia -Monitor replenish K and Mg as appropriate   Goal of care: Patient remains DNR   Body mass index is 24.82 kg/m.  DVT prophylaxis: enoxaparin  (LOVENOX ) injection 40 mg Start: 01/29/24 1000    Code Status: Limited: Do not attempt resuscitation (DNR) -DNR-LIMITED -Do Not Intubate/DNI  Family Communication:   Status is: Inpatient Remains inpatient appropriate because: CIR placement  PT Follow up Recs: Acute Inpatient Rehab (3hours/Day)02/01/2024 1602  Subjective: Seen at bedside does not have any complaints at this time.  Awaiting CIR placement  Examination:  General exam: Appears calm and comfortable  Respiratory system: Clear to auscultation. Respiratory effort normal. Cardiovascular system: S1 & S2 heard, RRR. No JVD, murmurs, rubs, gallops or clicks. No pedal edema. Gastrointestinal system: Abdomen is nondistended, soft and nontender. No organomegaly or masses felt. Normal bowel sounds heard. Central nervous system: Alert and oriented. No focal neurological deficits. Extremities: Symmetric 5 x 5 power. Skin: No rashes, lesions or ulcers Psychiatry: Judgement and insight appear normal. Mood & affect appropriate.

## 2024-02-02 LAB — CULTURE, BLOOD (ROUTINE X 2)
Culture: NO GROWTH
Culture: NO GROWTH
Culture: NO GROWTH
Culture: NO GROWTH

## 2024-02-02 LAB — GLUCOSE, CAPILLARY
Glucose-Capillary: 165 mg/dL — ABNORMAL HIGH (ref 70–99)
Glucose-Capillary: 237 mg/dL — ABNORMAL HIGH (ref 70–99)
Glucose-Capillary: 173 mg/dL — ABNORMAL HIGH (ref 70–99)
Glucose-Capillary: 206 mg/dL — ABNORMAL HIGH (ref 70–99)

## 2024-02-02 NOTE — Progress Notes (Signed)
 Inpatient Rehab Admissions Coordinator:   Pt did significantly better with therapy yesterday.  I spoke to wife and confirmed she can provide 24/7 supervision at home.  I expect that with 2-3 weeks on CIR he can reach that level.  Will submit for insurance auth and hopeful for admit today pending approval.    Reche Lowers, PT, DPT Admissions Coordinator [phone] 02/02/24  8:35 AM

## 2024-02-02 NOTE — Progress Notes (Signed)
 Occupational Therapy Treatment Patient Details Name: John [name] MRN: [id number] DOB: 09-09-1945 Today's Date: 02/02/2024   History of present illness 78 y.o. male adm 01/28/24 from [facility] hypotensive and unresponsive with RLL PNA, encephalopathy. PMHx: 10/6-10/14 admission for Lt weakness with Rt pontine infarct.T12 comp fx, HTN, HLD, T2DM, CVA, cognitive deficits, CAD, heart block s/p PPM, HFrEF, DVT, BPH, urinary retention with Foley catheter   OT comments  Pt progressing well towards goals. Progressed to complete standing grooming task at sink with RW and min assist to maintain midline in standing. Pt requiring x2 seated rest breaks d/t fatigue with standing. Improved incorporation of LUE for bilateral tasks. Improved sitting balance EOB, mostly CGA. As fatigue sets in pt requiring min assist to correct posterior lean. During session pt requiring increased time for task initiation, potentially d/t fatigue. Continue to recommend >3 hours of skilled rehab daily to optimize independence levels. Will continue to follow acutely.       If plan is discharge home, recommend the following:  A lot of help with bathing/dressing/bathroom;A lot of help with walking and/or transfers;Assistance with cooking/housework;Assist for transportation;Help with stairs or ramp for entrance;Supervision due to cognitive status;Direct supervision/assist for medications management;Direct supervision/assist for financial management   Equipment Recommendations  Other (comment) (Defer to next venue)       Precautions / Restrictions Precautions Precautions: Fall;Back Precaution Booklet Issued: No Recall of Precautions/Restrictions: Impaired Precaution/Restrictions Comments: L hemiparesis Restrictions Weight Bearing Restrictions Per Provider Order: No       Mobility Bed Mobility Overal bed mobility: Needs Assistance Bed Mobility: Supine to Sit     Supine to sit: Mod assist     General bed mobility  comments: Assist to initate with BLEs, and manage trunk    Transfers Overall transfer level: Needs assistance Equipment used: Rolling walker (2 wheels), Ambulation equipment used Transfers: Sit to/from Stand, Bed to chair/wheelchair/BSC Sit to Stand: Max assist     Step pivot transfers: Contact guard assist, Min assist     General transfer comment: Max assist to come to stand, once in standing CGA to min assist for step pivot to sink     Balance Overall balance assessment: Needs assistance Sitting-balance support: Feet supported, Bilateral upper extremity supported Sitting balance-Leahy Scale: Poor Sitting balance - Comments: mostly CGA, at times min assist for posterior lean Postural control: Right lateral lean, Posterior lean Standing balance support: Bilateral upper extremity supported Standing balance-Leahy Scale: Poor Standing balance comment: RW and external support       ADL either performed or assessed with clinical judgement   ADL Overall ADL's : Needs assistance/impaired Eating/Feeding: Set up;Bed level   Grooming: Minimal assistance;Standing Grooming Details (indicate cue type and reason): standing at sink, min assist to balance x2 seated rest breaks     Toilet Transfer: Maximal assistance;Ambulation;Rolling walker (2 wheels) Toilet Transfer Details (indicate cue type and reason): short distance simulated in room         Functional mobility during ADLs: Maximal assistance;Rolling walker (2 wheels) General ADL Comments: Limited standing balance and sitting balance, improved awareness and incorporating LUE into tasks    Extremity/Trunk Assessment Upper Extremity Assessment Upper Extremity Assessment: LUE deficits/detail LUE Deficits / Details: Shoulder AROM ~100 degrees, elbow, wrist, and hand WFL. Fair grip strength, decreased shoulder and bicep strength LUE Sensation: decreased light touch LUE Coordination: decreased fine motor;decreased gross motor    Lower Extremity Assessment Lower Extremity Assessment: Defer to PT evaluation        Vision  Vision Assessment?: No apparent visual deficits   Perception Perception Perception: Within Functional Limits   Praxis Praxis Praxis: WFL   Communication Communication Communication: Impaired Factors Affecting Communication: Hearing impaired;Difficulty expressing self   Cognition Arousal: Alert Behavior During Therapy: Flat affect, Agitated Cognition: Cognition impaired   Orientation impairments: Situation   Memory impairment (select all impairments): Short-term memory, Declarative long-term memory Attention impairment (select first level of impairment): Sustained attention Executive functioning impairment (select all impairments): Initiation, Reasoning, Problem solving, Sequencing OT - Cognition Comments: Delayed processing and initation, very limited verbalizations during session, mildly agitated     Following commands: Impaired Following commands impaired: Follows one step commands with increased time      Cueing   Cueing Techniques: Verbal cues, Tactile cues, Visual cues        General Comments VSS on RA    Pertinent Vitals/ Pain       Pain Assessment Pain Assessment: No/denies pain Pain Intervention(s): Monitored during session   Frequency  Min 2X/week        Progress Toward Goals  OT Goals(current goals can now be found in the care plan section)  Progress towards OT goals: Progressing toward goals  Acute Rehab OT Goals Patient Stated Goal: None stated OT Goal Formulation: With patient Time For Goal Achievement: 02/14/24 Potential to Achieve Goals: Fair ADL Goals Pt Will Perform Eating: with supervision;with set-up;sitting Pt Will Perform Grooming: sitting;with set-up Pt Will Perform Lower Body Bathing: with supervision;with adaptive equipment;sit to/from stand Pt Will Perform Upper Body Dressing: with min assist;sitting Pt Will Perform Lower Body  Dressing: with supervision;with adaptive equipment;sit to/from stand Pt Will Transfer to Toilet: with min assist;stand pivot transfer;bedside commode Pt Will Perform Tub/Shower Transfer: Shower transfer;with contact guard assist;ambulating;shower seat;rolling walker Pt/caregiver will Perform Home Exercise Program: Increased ROM;Increased strength;Left upper extremity;With Supervision;With written HEP provided Additional ADL Goal #1: pt will perform bed mobility min A in prep for ADLs Additional ADL Goal #2: pt will demo good seated balance in prep for ADLs  Plan         AM-PAC OT 6 Clicks Daily Activity     Outcome Measure   Help from another person eating meals?: A Little Help from another person taking care of personal grooming?: A Little Help from another person toileting, which includes using toliet, bedpan, or urinal?: A Lot Help from another person bathing (including washing, rinsing, drying)?: A Lot Help from another person to put on and taking off regular upper body clothing?: A Lot Help from another person to put on and taking off regular lower body clothing?: A Lot 6 Click Score: 14    End of Session Equipment Utilized During Treatment: Gait belt;Rolling walker (2 wheels)  OT Visit Diagnosis: Unsteadiness on feet (R26.81);Other abnormalities of gait and mobility (R26.89);Muscle weakness (generalized) (M62.81);Feeding difficulties (R63.3);Other symptoms and signs involving cognitive function;Hemiplegia and hemiparesis Hemiplegia - Right/Left: Left Hemiplegia - dominant/non-dominant: Non-Dominant   Activity Tolerance Patient tolerated treatment well   Patient Left in bed;with call bell/phone within reach   Nurse Communication Mobility status        Time: 9149-9076 OT Time Calculation (min): 33 min  Charges: OT General Charges $OT Visit: 1 Visit OT Treatments $Self Care/Home Management : 23-37 mins  Adrianne BROCKS, OT  Acute Rehabilitation Services Office  [phone] Secure chat preferred   Adrianne GORMAN Savers 02/02/2024, 9:53 AM

## 2024-02-02 NOTE — Plan of Care (Signed)
    Progress Note from the Palliative Medicine Team at [facility]   Patient Name: Jung Yurchak        Date: 02/02/2024 DOB: 03-04-1946  Age: 78 y.o. MRN#: [id number] Attending Physician: Caleen Burgess BROCKS, MD Primary Care Physician: [name], DO Admit Date: 01/28/2024   Spoke briefly to wife by phone as follow-up to last night's family meeting.  She verbalizes appreciation for that meeting and conversation with her family.  For now plan and hope is for continued short-term rehab either at [facility] or CIR. goals are clear.  Wife understands the difficult situation and decisions she and her family are facing.  PMT will shadow for needs, family is encouraged to call with questions or concerns  No charge   Ronal Plants NP  Palliative Medicine Team Team Phone # [phone] Pager [phone]

## 2024-02-02 NOTE — Progress Notes (Signed)
 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  CT showed concerns of right lower lobe bronchopneumonia likely from aspiration, T12 compression fracture and early cirrhosis.  Blood cultures are negative, antibiotics have been transitioned to Augmentin .  Patient has refused speech and swallow evaluation.  Seen by palliative care service and decision is for patient to go to CIR.  Assessment & Plan:   Acute metabolic encephalopathy: Improved This is likely secondary to aspiration pneumonia and hypotension.  CT head negative, EEG showed encephalopathy.  Metabolic workup has mostly been unrevealing.  Home mentation is improved   Severe sepsis due to aspiration right lower lobe pneumonia: Present on admission.   Present on admission.  Initially treated with IV meropenem now transition to p.o. Augmentin .  EOT 10/24.  Patient has declined speech and swallow evaluation/MBS. -Prior provider discussed urine culture results with ID, Dr. Fleeta Rothman.  Exchanging Foley sufficient for funguria.  -Aspiration precaution -Palliative care   Cognitive impairment with delirium:  Awake and alert but only oriented to self and time  Anxiety and depression: Stable Continue Lexapro  and delirium precaution - Low-dose Seroquel  started per wife's request by the previous provider   IDDM-2 with hyperglycemia: A1c 8.5% on 10/7. -Long-acting, sliding scale necessary.   Normocytic anemia: Relatively stable -Hemoglobin stable   Hx of CVA: Seems to have residual left-sided weakness.  CT head without acute  finding. -Continue ASA, Plavix , Lipitor   -PT/OT-recommended SNF   Chronic HFrEF:  -TTE in 11/2023 with LVEF of 30 to 35%.  Appears euvolemic on exam.      Bladder outlet obstruction with chronic Foley -Foley catheter exchanged on admission -Outpatient follow-up with urology   Hypotension/history of hypertension: Now hypotensive. - Resume home losartan , IV as needed   HAGMA: Likely due to lactic acidosis versus DKA.  Resolved.   Cholelithiasis-seems chronic.  Doubt cholecystitis.  No GI symptoms. - Outpatient follow-up   T12 compression fracture - Supportive care   Hypokalemia/hypomagnesemia -Monitor replenish K and Mg as appropriate   Goal of care: Patient remains DNR   Body mass index is 24.82 kg/m.  DVT prophylaxis: enoxaparin  (LOVENOX ) injection 40 mg Start: 01/29/24 1000    Code Status: Limited: Do not attempt resuscitation (DNR) -DNR-LIMITED -Do Not Intubate/DNI  Family Communication:   Status is: Inpatient Remains inpatient appropriate because: CIR placement  PT Follow up Recs: Acute Inpatient Rehab (3hours/Day)02/01/2024 1602  Subjective: Seen at bedside does not have any complaints at this time.  Awaiting CIR placement  Examination:  General exam: Appears calm and comfortable  Respiratory system: Clear to auscultation. Respiratory effort normal. Cardiovascular system: S1 & S2 heard, RRR. No JVD, murmurs, rubs, gallops or clicks. No pedal edema. Gastrointestinal system: Abdomen is nondistended, soft and nontender. No organomegaly or masses felt. Normal bowel sounds heard. Central nervous system: Alert and oriented. No focal neurological deficits. Extremities: Symmetric 5 x 5 power. Skin: No rashes, lesions or ulcers Psychiatry: Judgement and insight appear normal. Mood & affect appropriate.                Diet Orders (From admission, onward)     Start  Ordered   01/28/24 2117  Diet regular Room service appropriate? Yes; Fluid consistency:  Thin  Diet effective now       Question Answer Comment  Room service appropriate? Yes   Fluid consistency: Thin      01/28/24 2119            Objective: Vitals:   02/01/24 2019 02/01/24 2340 02/02/24 0407 02/02/24 0856  BP: 124/89 127/64 (!) 140/68 (!) 154/69  Pulse: 63 63 70 64  Resp: 20 17 14 16   Temp: 99.4 F (37.4 C) 98.7 F (37.1 C) 97.7 F (36.5 C) 98.4 F (36.9 C)  TempSrc: Oral Oral Oral Oral  SpO2: 100% 97% 96% 96%  Weight:      Height:        Intake/Output Summary (Last 24 hours) at 02/02/2024 1145 Last data filed at 02/02/2024 0914 Gross per 24 hour  Intake 720 ml  Output 1675 ml  Net -955 ml   Filed Weights   01/28/24 2038  Weight: 83 kg    Scheduled Meds:  amoxicillin -clavulanate  1 tablet Oral Q12H   aspirin  EC  81 mg Oral Daily   atorvastatin   80 mg Oral QHS   Chlorhexidine  Gluconate Cloth  6 each Topical Daily   clopidogrel   75 mg Oral Q breakfast   enoxaparin  (LOVENOX ) injection  40 mg Subcutaneous Q24H   escitalopram   10 mg Oral Daily   insulin  aspart  0-5 Units Subcutaneous QHS   insulin  aspart  0-9 Units Subcutaneous TID WC   insulin  glargine-yfgn  15 Units Subcutaneous Daily   losartan   25 mg Oral Daily   pantoprazole   40 mg Oral QHS   QUEtiapine   25 mg Oral QHS   sodium chloride  flush  3 mL Intravenous Q12H   Continuous Infusions:  Nutritional status     Body mass index is 24.82 kg/m.  Data Reviewed:   CBC: Recent Labs  Lab 01/28/24 1411 01/29/24 0256 01/30/24 0250 01/31/24 0759 02/01/24 0232  WBC 12.4* 11.2* 6.0 7.1 6.9  HGB 12.6* 11.3* 10.8* 11.6* 11.0*  HCT 39.1 33.8* 32.1* 34.5* 33.0*  MCV 90.5 89.2 89.2 87.8 88.2  PLT 304 254 228 253 267   Basic Metabolic Panel: Recent Labs  Lab 01/28/24 1411 01/29/24 0256 01/30/24 0250 01/31/24 0759 02/01/24 0232  NA 137 137 137 137 136  K 4.1 3.9 3.2* 3.8 3.7  CL 102 103 105 105 104  CO2 15* 23 24 25 23   GLUCOSE 154* 115* 236* 156* 272*  BUN 32* 26* 19 11 12    CREATININE 1.19 0.91 0.76 0.67 0.78  CALCIUM  9.7 9.2 8.9 9.2 8.7*  MG  --  1.4* 2.0 1.5* 1.6*  PHOS  --   --   --  2.2* 2.4*   GFR: Estimated Creatinine Clearance: 84.9 mL/min (by C-G formula based on SCr of 0.78 mg/dL). Liver Function Tests: Recent Labs  Lab 01/28/24 2329 01/30/24 0250 01/31/24 0759 02/01/24 0232  AST 26 26  --   --   ALT 33 28  --   --   ALKPHOS 92 75  --   --   BILITOT 1.5* 1.3*  --   --   PROT 5.7* 4.7*  --   --   ALBUMIN 3.0* 2.4* 2.6* 2.4*   No results for input(s): LIPASE, AMYLASE in the last 168 hours. Recent Labs  Lab 01/29/24 0256  AMMONIA 37*   Coagulation Profile: Recent Labs  Lab 01/28/24 2329  INR 1.0   Cardiac  Enzymes: Recent Labs  Lab 01/28/24 1411  CKTOTAL 44*  CKMB 3.1   BNP (last 3 results) Recent Labs    01/16/24 1659  PROBNP 465.0*   HbA1C: No results for input(s): HGBA1C in the last 72 hours. CBG: Recent Labs  Lab 02/01/24 1126 02/01/24 1549 02/01/24 2113 02/02/24 0600 02/02/24 1128  GLUCAP 184* 332* 222* 165* 237*   Lipid Profile: No results for input(s): CHOL, HDL, LDLCALC, TRIG, CHOLHDL, LDLDIRECT in the last 72 hours. Thyroid  Function Tests: No results for input(s): TSH, T4TOTAL, FREET4, T3FREE, THYROIDAB in the last 72 hours. Anemia Panel: No results for input(s): VITAMINB12, FOLATE, FERRITIN, TIBC, IRON, RETICCTPCT in the last 72 hours. Sepsis Labs: Recent Labs  Lab 01/28/24 1813 01/28/24 2329 01/29/24 0256  PROCALCITON  --  <0.10 <0.10  LATICACIDVEN 3.6* 2.2* 1.2    Recent Results (from the past 240 hours)  Culture, blood (Routine X 2) w Reflex to ID Panel     Status: None   Collection Time: 01/28/24  6:13 PM   Specimen: BLOOD RIGHT HAND  Result Value Ref Range Status   Specimen Description BLOOD RIGHT HAND  Final   Special Requests   Final    BOTTLES DRAWN AEROBIC ONLY Blood Culture results may not be optimal due to an inadequate volume of blood  received in culture bottles   Culture   Final    NO GROWTH 5 DAYS Performed at [facility], [address] [place], [place] [address]    Report Status 02/02/2024 FINAL  Final  Culture, blood (Routine X 2) w Reflex to ID Panel     Status: None   Collection Time: 01/28/24  6:13 PM   Specimen: BLOOD LEFT HAND  Result Value Ref Range Status   Specimen Description BLOOD LEFT HAND  Final   Special Requests   Final    BOTTLES DRAWN AEROBIC ONLY Blood Culture results may not be optimal due to an inadequate volume of blood received in culture bottles   Culture   Final    NO GROWTH 5 DAYS Performed at [facility], [address] [place], [place] [address]    Report Status 02/02/2024 FINAL  Final  Urine Culture (for pregnant, neutropenic or urologic patients or patients with an indwelling urinary catheter)     Status: Abnormal   Collection Time: 01/28/24  6:45 PM   Specimen: Urine, Catheterized  Result Value Ref Range Status   Specimen Description URINE, CATHETERIZED  Final   Special Requests   Final    NONE Performed at [facility], [address] [place], [place] [address]    Culture 60,000 COLONIES/mL YEAST (A)  Final   Report Status 01/30/2024 FINAL  Final  Remove and replace urinary cath (placed > 5 days) then obtain urine culture from new indwelling urinary catheter.     Status: Abnormal   Collection Time: 01/28/24  8:42 PM   Specimen: Urine, Catheterized  Result Value Ref Range Status   Specimen Description URINE, CATHETERIZED  Final   Special Requests   Final    NONE Performed at [facility], [address], [place], [place] [address]    Culture >=100,000 COLONIES/mL YEAST (A)  Final   Report Status 01/29/2024 FINAL  Final  MRSA Next Gen by PCR, Nasal     Status: None   Collection Time: 01/28/24  9:19 PM   Specimen: Nasal Mucosa; Nasal Swab  Result Value Ref Range Status   MRSA  by PCR Next Gen NOT DETECTED NOT DETECTED Final    Comment:  (NOTE) The GeneXpert MRSA Assay (FDA approved for NASAL specimens only), is one component of a comprehensive MRSA colonization surveillance program. It is not intended to diagnose MRSA infection nor to guide or monitor treatment for MRSA infections. Test performance is not FDA approved in patients less than 44 years old. Performed at [facility], [address] [place], [place] [address]   Culture, blood (x 2)     Status: None   Collection Time: 01/28/24 11:29 PM   Specimen: BLOOD RIGHT HAND  Result Value Ref Range Status   Specimen Description BLOOD RIGHT HAND  Final   Special Requests   Final    BOTTLES DRAWN AEROBIC ONLY Blood Culture results may not be optimal due to an inadequate volume of blood received in culture bottles   Culture   Final    NO GROWTH 5 DAYS Performed at [facility], [address], [place], [place] [address]    Report Status 02/02/2024 FINAL  Final  Culture, blood (x 2)     Status: None   Collection Time: 01/28/24 11:29 PM   Specimen: BLOOD RIGHT ARM  Result Value Ref Range Status   Specimen Description BLOOD RIGHT ARM  Final   Special Requests   Final    BOTTLES DRAWN AEROBIC ONLY Blood Culture results may not be optimal due to an inadequate volume of blood received in culture bottles   Culture   Final    NO GROWTH 5 DAYS Performed at [facility], [address] [place], [place] [address]    Report Status 02/02/2024 FINAL  Final         Radiology Studies: No results found.         LOS: 5 days   Time spent= 35 mins    John JAYSON Dare, MD [facility]  If 7PM-7AM, please contact night-coverage  02/02/2024, 11:45 AM

## 2024-02-02 NOTE — PMR Pre-admission (Signed)
 PMR Admission Coordinator Pre-Admission Assessment  Patient: Ava Deguire is an 78 y.o., male MRN: [id number] DOB: [birth year] Height: 6' (182.9 cm) Weight: 83 kg  Insurance Information HMO: ***    PPO: ***     PCP: ***     IPA: ***     80/20: ***     OTHER: *** PRIMARY: ***      Policy#: ***      Subscriber: *** CM Name: ***      Phone#: ***     Fax#: *** Pre-Cert#: ***      Employer: *** Benefits:  Phone #: ***     Name: *** Eff. Date: ***     Deduct: ***      Out of Pocket Max: ***      Life Max: *** CIR: ***      SNF: *** Outpatient: ***     Co-Pay: *** Home Health: ***      Co-Pay: *** DME: ***     Co-Pay: *** Providers: *** SECONDARY: ***      Policy#: ***     Phone#: ***  Financial Counselor: ***      Phone#: ***  The "Data Collection Information Summary" for patients in Inpatient Rehabilitation Facilities with attached "Privacy Act Statement-Health Care Records" was provided and verbally reviewed with: {CHL IP Patient Family WJ:695449998}  Emergency Contact Information Contact Information     Name Relation Home Work Mobile   Murphy Spouse [phone]  [phone]   Marcial, Pless   [phone]      Other Contacts     Name Relation Home Work Mobile   Craig,Bonnie Daughter   [phone]       Current Medical History  Patient Admitting Diagnosis: CVA History of Present Illness: Pt is a 78 year old male with medical hx significant for: HTN, hyperlipidemai, DM II, h/o CVA, cognitive deficits, CAD, previous DVT, urinary retention with Foley catheter in place, PNA. Pt recently admitted to hospital from September 15-22 d/t intractable nausea and vomiting. Imaging showed basal ganglia lacunar infarcts. Pt discharged to [facility]. Pt discharged from [facility] on 01/16/24. Pt presented to [facility] on 01/16/24 d/t left-sided weakness. CT head negative for acute abnormalities. Chest x-ray negative. Abnormal urinalysis. Urine culture grew ESBL E.Coli. Pt  started on Rocephin . Neurology consulted. Pt transferred to [facility] on 01/18/24. MRI was positive for right pontine infarct with possible punctate petechial hemorrhage. 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 Therapy evaluations completed and CIR recommended d/t pt's deficits in functional mobility and pt was admitted to CIR on 10/14.  On 10/18 he developed AMS with somnolence and was transferred back to the acute setting for workup which revealed aspiration PNA, T12 compression fracture, and early cirrhosis.  He is on augmentin .  Therapy evals completed and pt recommended to return to AIR>   --Complete NIHSS TOTAL: 3   Patient's medical record from [facility] has been reviewed by the rehabilitation admission coordinator and physician. Past Medical History  Past Medical History:  Diagnosis Date   BPH (benign prostatic hyperplasia)    CTS (carpal tunnel syndrome)    Depression    Diabetes mellitus    Essential hypertension    GERD (gastroesophageal reflux disease)    History of breast cancer    Hypercholesteremia    Neuropathy    Stroke [facility])     Has the patient had major surgery during 100 days prior to admission? No  Family History  family history includes Cancer in his mother, paternal grandmother, and sister; Diabetes in his father; Lymphoma in his mother; Ovarian cancer in his sister; Stroke in his father.  Current Medications  Current Facility-Administered Medications:    acetaminophen  (TYLENOL ) tablet 650 mg, 650 mg, Oral, Q6H PRN **OR** acetaminophen  (TYLENOL ) suppository 650 mg, 650 mg, Rectal, Q6H PRN, Opyd, Timothy S, MD, 650 mg at 01/28/24 2252   amoxicillin -clavulanate (AUGMENTIN ) 875-125 MG per tablet 1 tablet, 1 tablet, Oral, Q12H, Gonfa, Taye T, MD, 1 tablet at 02/02/24 0908   aspirin  EC tablet 81 mg, 81 mg, Oral, Daily, Opyd, Timothy S, MD, 81 mg at 02/02/24 0908   atorvastatin  (LIPITOR ) tablet 80 mg, 80 mg, Oral, QHS, Opyd,  Timothy S, MD, 80 mg at 02/01/24 2133   Chlorhexidine  Gluconate Cloth 2 % PADS 6 each, 6 each, Topical, Daily, Opyd, Evalene RAMAN, MD, 6 each at 02/02/24 0909   clopidogrel  (PLAVIX ) tablet 75 mg, 75 mg, Oral, Q breakfast, Hammons, Kimberly B, RPH, 75 mg at 02/02/24 0908   enoxaparin  (LOVENOX ) injection 40 mg, 40 mg, Subcutaneous, Q24H, Opyd, Timothy S, MD, 40 mg at 02/02/24 0908   escitalopram  (LEXAPRO ) tablet 10 mg, 10 mg, Oral, Daily, Opyd, Timothy S, MD, 10 mg at 02/02/24 0908   fentaNYL  (SUBLIMAZE ) injection 12.5 mcg, 12.5 mcg, Intravenous, Q2H PRN, Gonfa, Taye T, MD   guaiFENesin  (ROBITUSSIN) 100 MG/5ML liquid 5 mL, 5 mL, Oral, Q4H PRN, Opyd, Timothy S, MD   insulin  aspart (novoLOG ) injection 0-5 Units, 0-5 Units, Subcutaneous, QHS, Opyd, Timothy S, MD, 2 Units at 02/01/24 2133   insulin  aspart (novoLOG ) injection 0-9 Units, 0-9 Units, Subcutaneous, TID WC, Gonfa, Taye T, MD, 2 Units at 02/02/24 0603   insulin  glargine-yfgn (SEMGLEE ) injection 15 Units, 15 Units, Subcutaneous, Daily, Amin, Ankit C, MD, 15 Units at 02/02/24 0908   [COMPLETED] lactulose (CHRONULAC) 10 GM/15ML solution 20 g, 20 g, Oral, BID, 20 g at 01/29/24 2134 **FOLLOWED BY** lactulose (CHRONULAC) 10 GM/15ML solution 20 g, 20 g, Oral, BID PRN, Gonfa, Taye T, MD   losartan  (COZAAR ) tablet 25 mg, 25 mg, Oral, Daily, Gonfa, Taye T, MD, 25 mg at 02/02/24 0908   oxyCODONE  (Oxy IR/ROXICODONE ) immediate release tablet 2.5-5 mg, 2.5-5 mg, Oral, Q4H PRN, Opyd, Timothy S, MD   pantoprazole  (PROTONIX ) EC tablet 40 mg, 40 mg, Oral, QHS, Opyd, Timothy S, MD, 40 mg at 02/01/24 2133   prochlorperazine  (COMPAZINE ) injection 10 mg, 10 mg, Intravenous, Q4H PRN, Opyd, Timothy S, MD, 10 mg at 01/28/24 2254   QUEtiapine  (SEROQUEL ) tablet 25 mg, 25 mg, Oral, QHS, Gonfa, Taye T, MD, 25 mg at 02/01/24 2133   sodium chloride  flush (NS) 0.9 % injection 3 mL, 3 mL, Intravenous, Q12H, Opyd, Evalene RAMAN, MD, 3 mL at 02/02/24 0909  Patients Current Diet:  Diet  Order             Diet regular Room service appropriate? Yes; Fluid consistency: Thin  Diet effective now                   Precautions / Restrictions Precautions Precautions: Fall, Back Precaution Booklet Issued: No Precaution/Restrictions Comments: L hemiparesis Restrictions Weight Bearing Restrictions Per Provider Order: No   Has the patient had 2 or more falls or a fall with injury in the past year? Yes  Prior Activity Level Limited Community (1-2x/wk): MD appointments   Prior Functional Level Self Care: Did the patient need help bathing, dressing, using the toilet or eating? Needed some  help   Indoor Mobility: Did the patient need assistance with walking from room to room (with or without device)? Independent   Stairs: Did the patient need assistance with internal or external stairs (with or without device)? Independent   Functional Cognition: Did the patient need help planning regular tasks such as shopping or remembering to take medications? Needed some help   Patient Information Are you of Hispanic, Latino/a,or Spanish origin?: A. No, not of Hispanic, Latino/a, or Spanish origin What is your race?: A. White Do you need or want an interpreter to communicate with a doctor or health care staff?: 0. No   Patient's Response To:  Health Literacy and Transportation Is the patient able to respond to health literacy and transportation needs?: Yes Health Literacy - How often do you need to have someone help you when you read instructions, pamphlets, or other written material from your doctor or pharmacy?: Never In the past 12 months, has lack of transportation kept you from medical appointments or from getting medications?: No In the past 12 months, has lack of transportation kept you from meetings, work, or from getting things needed for daily living?: No   Home Assistive Devices / Equipment Home Equipment: Agricultural consultant (2 wheels), Goldsmith - single point, BSC/3in1,  Wheelchair - power  Prior Device Use: Indicate devices/aids used by the patient prior to current illness, exacerbation or injury? Walker  Current Functional Level Cognition  Orientation Level: Oriented to person    Extremity Assessment (includes Sensation/Coordination)  RUE Deficits / Details: AROM WFL. Gross strength 4+/5. LUE Deficits / Details: prefers to use RUE, reduced dexterity and general strength in entire LUE, impaired coordination, 3+/5 LUE Sensation: decreased light touch LUE Coordination: decreased fine motor, decreased gross motor  Lower Extremity Assessment: Defer to PT evaluation LLE Deficits / Details: slow movements, decreased control LLE Coordination: decreased gross motor    ADLs  Overall ADL's : Needs assistance/impaired Eating/Feeding: Minimal assistance, Bed level Grooming: Minimal assistance, Sitting Upper Body Bathing: Sitting, Cueing for compensatory techniques, Minimal assistance, Moderate assistance Lower Body Bathing: Maximal assistance, Bed level Upper Body Dressing : Moderate assistance, Sitting Lower Body Dressing: Maximal assistance Toilet Transfer: Maximal assistance, +2 for physical assistance Toileting- Clothing Manipulation and Hygiene: Maximal assistance Functional mobility during ADLs: Maximal assistance, +2 for physical assistance    Mobility  Overal bed mobility: Needs Assistance Bed Mobility: Rolling, Sidelying to Sit Rolling: Mod assist Sidelying to sit: Mod assist Supine to sit: Mod assist Sit to supine: Mod assist General bed mobility comments: Assist to elevate trunk and bring hips to EOB    Transfers  Overall transfer level: Needs assistance Equipment used: Rolling walker (2 wheels), Ambulation equipment used Transfers: Sit to/from Stand, Bed to chair/wheelchair/BSC Sit to Stand: Mod assist, +2 safety/equipment Bed to/from chair/wheelchair/BSC transfer type:: Via Lift equipment Transfer via Lift Equipment: Stedy General  transfer comment: Used Stedy for bed to chair. From chair stood with walker with mod assist to power up and balance.    Ambulation / Gait / Stairs / Wheelchair Mobility  Ambulation/Gait Ambulation/Gait assistance: Mod assist, +2 safety/equipment Gait Distance (Feet): 35 Feet Assistive device: Rolling walker (2 wheels) Gait Pattern/deviations: Step-through pattern, Decreased stride length, Knee flexed in stance - left, Trunk flexed General Gait Details: Assist for balance and support and 2nd person following with chair. As fatigued lt knee flexed in stance and lt lateral trunk lean. Gait velocity: decr Gait velocity interpretation: <1.31 ft/sec, indicative of household ambulator    Posture / Balance  Dynamic Sitting Balance Sitting balance - Comments: min to CGA Balance Overall balance assessment: Needs assistance Sitting-balance support: Feet supported, Bilateral upper extremity supported Sitting balance-Leahy Scale: Poor Sitting balance - Comments: min to CGA Postural control: Right lateral lean Standing balance support: Bilateral upper extremity supported Standing balance-Leahy Scale: Poor Standing balance comment: walker and min to mod assist     Special considerations/life events  Skin Ecchymosis: sacrum/bilateral; Erythema/Redness: buttocks/bilateral; Petechiae: abdomen/bilateral; Stage 2 Pressure Injury/sacrum, Urethral Catheter and Diabetic management Novolog  0-9 units 3x daily with meals, Lantus  5 units daily at bedtime      Previous Home Environment (from acute therapy documentation) Living Arrangements: Spouse/significant other Available Help at Discharge: Family Type of Home: House Home Layout: Able to live on main level with bedroom/bathroom, Multi-level Alternate Level Stairs-Rails: Right, Left, Can reach both Alternate Level Stairs-Number of Steps: He reported sleeping in a recliner on the main level of the home. All bedrooms are upstairs Home Access: Stairs to  enter Entrance Stairs-Rails: Can reach both Entrance Stairs-Number of Steps: 4 Bathroom Shower/Tub: Health visitor: Standard Additional Comments: DC from SNF 10/6 after tx for comp fx and admitted with CVA same date  Previous Home Environment (from acute therapy documentation) Living Arrangements: Spouse/significant other Available Help at Discharge: Family, Available 24 hours/day Type of Home: House Home Layout: Able to live on main level with bedroom/bathroom (has bed in living room) Alternate Level Stairs-Rails: Right, Left, Can reach both Alternate Level Stairs-Number of Steps: He reported sleeping in a recliner on the main level of the home. All bedrooms are upstairs Home Access: Stairs to enter Entrance Stairs-Rails: Can reach both (on stairs in back of the house) Entrance Stairs-Number of Steps: 4 Bathroom Shower/Tub: Health visitor: Standard Bathroom Accessibility: Yes How Accessible: Accessible via walker Additional Comments: pt was DC from SNF on day of adm 10/6   Discharge Living Setting Plans for Discharge Living Setting: Patient's home Type of Home at Discharge: House Discharge Home Layout: Able to live on main level with bedroom/bathroom (has bed in living room) Discharge Home Access: Stairs to enter Entrance Stairs-Rails: Can reach both (on stairs in back) Entrance Stairs-Number of Steps: 4 Discharge Bathroom Shower/Tub: Walk-in shower Discharge Bathroom Toilet: Standard Discharge Bathroom Accessibility: Yes How Accessible: Accessible via walker Does the patient have any problems obtaining your medications?: No   Social/Family/Support Systems Patient Roles: Spouse Anticipated Caregiver: Atari Novick, wife Anticipated Caregiver's Contact Information: [phone] Caregiver Availability: 24/7 Discharge Plan Discussed with Primary Caregiver: Yes Is Caregiver In Agreement with Plan?: Yes Does Caregiver/Family have Issues with  Lodging/Transportation while Pt is in Rehab?: No   Goals Patient/Family Goal for Rehab: PT/OT/SLP supervision to mod I Expected length of stay: 9-12 days Pt/Family Agrees to Admission and willing to participate: Yes Program Orientation Provided & Reviewed with Pt/Caregiver Including Roles  & Responsibilities: Yes   Decrease burden of Care through IP rehab admission: NA   Possible need for SNF placement upon discharge: Not anticipated  Patient Condition: This patient's condition remains as documented in the consult dated 10/21, in which the Rehabilitation Physician determined and documented that the patient's condition is appropriate for intensive rehabilitative care in an inpatient rehabilitation facility. Will admit to inpatient rehab pending insurance approval ***.  Preadmission Screen Completed By:  Reche FORBES Lowers, 02/02/2024 9:31 AM ______________________________________________________________________   Discussed status with Dr. PIERRETTE on *** at *** and received approval for admission today.  Admission Coordinator:  Caitlin E Warren, PT, time PIERRETTEPattricia ***  Assessment/Plan: Diagnosis: *** Does the need for close, 24 hr/day Medical supervision in concert with the patient's rehab needs make it unreasonable for this patient to be served in a less intensive setting? {yes_no_potentially:3041433} Co-Morbidities requiring supervision/potential complications: *** Due to {due un:6958565}, does the patient require 24 hr/day rehab nursing? {yes_no_potentially:3041433} Does the patient require coordinated care of a physician, rehab nurse, PT, OT, and SLP to address physical and functional deficits in the context of the above medical diagnosis(es)? {yes_no_potentially:3041433} Addressing deficits in the following areas: {deficits:3041436} Can the patient actively participate in an intensive therapy program of at least 3 hrs of therapy 5 days a week? {yes_no_potentially:3041433} The potential for  patient to make measurable gains while on inpatient rehab is {potential:3041437} Anticipated functional outcomes upon discharge from inpatient rehab: {functional outcomes:304600100} PT, {functional outcomes:304600100} OT, {functional outcomes:304600100} SLP Estimated rehab length of stay to reach the above functional goals is: *** Anticipated discharge destination: {anticipated dc setting:21604} 10. Overall Rehab/Functional Prognosis: {potential:3041437}   MD Signature: ***

## 2024-02-03 ENCOUNTER — Other Ambulatory Visit: Payer: Self-pay

## 2024-02-03 ENCOUNTER — Inpatient Hospital Stay (HOSPITAL_COMMUNITY)
Admission: AD | Admit: 2024-02-03 | Discharge: 2024-02-20 | DRG: 057 | Disposition: A | Discharge status: Skilled Nursing Facility | Source: Intra-hospital | Attending: Physical Medicine and Rehabilitation | Admitting: Physical Medicine and Rehabilitation

## 2024-02-03 ENCOUNTER — Encounter (HOSPITAL_COMMUNITY): Payer: Self-pay | Admitting: Physical Medicine and Rehabilitation

## 2024-02-03 DIAGNOSIS — Z7984 Long term (current) use of oral hypoglycemic drugs: Secondary | ICD-10-CM | POA: Diagnosis not present

## 2024-02-03 DIAGNOSIS — Z66 Do not resuscitate: Secondary | ICD-10-CM | POA: Diagnosis present

## 2024-02-03 DIAGNOSIS — R410 Disorientation, unspecified: Secondary | ICD-10-CM | POA: Diagnosis not present

## 2024-02-03 DIAGNOSIS — D649 Anemia, unspecified: Secondary | ICD-10-CM | POA: Diagnosis present

## 2024-02-03 DIAGNOSIS — N400 Enlarged prostate without lower urinary tract symptoms: Secondary | ICD-10-CM | POA: Diagnosis present

## 2024-02-03 DIAGNOSIS — K8020 Calculus of gallbladder without cholecystitis without obstruction: Secondary | ICD-10-CM | POA: Diagnosis not present

## 2024-02-03 DIAGNOSIS — K592 Neurogenic bowel, not elsewhere classified: Secondary | ICD-10-CM | POA: Diagnosis not present

## 2024-02-03 DIAGNOSIS — I110 Hypertensive heart disease with heart failure: Secondary | ICD-10-CM | POA: Diagnosis not present

## 2024-02-03 DIAGNOSIS — G9340 Encephalopathy, unspecified: Secondary | ICD-10-CM | POA: Diagnosis present

## 2024-02-03 DIAGNOSIS — Z794 Long term (current) use of insulin: Secondary | ICD-10-CM | POA: Diagnosis not present

## 2024-02-03 DIAGNOSIS — R404 Transient alteration of awareness: Secondary | ICD-10-CM | POA: Diagnosis not present

## 2024-02-03 DIAGNOSIS — I69354 Hemiplegia and hemiparesis following cerebral infarction affecting left non-dominant side: Principal | ICD-10-CM

## 2024-02-03 DIAGNOSIS — I442 Atrioventricular block, complete: Secondary | ICD-10-CM | POA: Diagnosis present

## 2024-02-03 DIAGNOSIS — I69391 Dysphagia following cerebral infarction: Secondary | ICD-10-CM | POA: Diagnosis not present

## 2024-02-03 DIAGNOSIS — E1140 Type 2 diabetes mellitus with diabetic neuropathy, unspecified: Secondary | ICD-10-CM | POA: Diagnosis present

## 2024-02-03 DIAGNOSIS — R4182 Altered mental status, unspecified: Secondary | ICD-10-CM | POA: Diagnosis present

## 2024-02-03 DIAGNOSIS — E785 Hyperlipidemia, unspecified: Secondary | ICD-10-CM | POA: Diagnosis present

## 2024-02-03 DIAGNOSIS — I10 Essential (primary) hypertension: Secondary | ICD-10-CM | POA: Diagnosis not present

## 2024-02-03 DIAGNOSIS — Z823 Family history of stroke: Secondary | ICD-10-CM

## 2024-02-03 DIAGNOSIS — F325 Major depressive disorder, single episode, in full remission: Secondary | ICD-10-CM | POA: Diagnosis present

## 2024-02-03 DIAGNOSIS — G8929 Other chronic pain: Secondary | ICD-10-CM | POA: Diagnosis present

## 2024-02-03 DIAGNOSIS — R5381 Other malaise: Secondary | ICD-10-CM | POA: Diagnosis present

## 2024-02-03 DIAGNOSIS — Z7982 Long term (current) use of aspirin: Secondary | ICD-10-CM

## 2024-02-03 DIAGNOSIS — I69319 Unspecified symptoms and signs involving cognitive functions following cerebral infarction: Secondary | ICD-10-CM | POA: Diagnosis not present

## 2024-02-03 DIAGNOSIS — R41841 Cognitive communication deficit: Secondary | ICD-10-CM

## 2024-02-03 DIAGNOSIS — Z853 Personal history of malignant neoplasm of breast: Secondary | ICD-10-CM

## 2024-02-03 DIAGNOSIS — I5022 Chronic systolic (congestive) heart failure: Secondary | ICD-10-CM | POA: Diagnosis not present

## 2024-02-03 DIAGNOSIS — N179 Acute kidney failure, unspecified: Secondary | ICD-10-CM | POA: Diagnosis not present

## 2024-02-03 DIAGNOSIS — K219 Gastro-esophageal reflux disease without esophagitis: Secondary | ICD-10-CM | POA: Diagnosis not present

## 2024-02-03 DIAGNOSIS — I951 Orthostatic hypotension: Secondary | ICD-10-CM | POA: Diagnosis present

## 2024-02-03 DIAGNOSIS — G4700 Insomnia, unspecified: Secondary | ICD-10-CM | POA: Diagnosis not present

## 2024-02-03 DIAGNOSIS — Z807 Family history of other malignant neoplasms of lymphoid, hematopoietic and related tissues: Secondary | ICD-10-CM

## 2024-02-03 DIAGNOSIS — I6389 Other cerebral infarction: Secondary | ICD-10-CM | POA: Diagnosis present

## 2024-02-03 DIAGNOSIS — Z7902 Long term (current) use of antithrombotics/antiplatelets: Secondary | ICD-10-CM | POA: Diagnosis not present

## 2024-02-03 DIAGNOSIS — E7800 Pure hypercholesterolemia, unspecified: Secondary | ICD-10-CM | POA: Diagnosis not present

## 2024-02-03 DIAGNOSIS — I5020 Unspecified systolic (congestive) heart failure: Secondary | ICD-10-CM | POA: Diagnosis present

## 2024-02-03 DIAGNOSIS — G459 Transient cerebral ischemic attack, unspecified: Secondary | ICD-10-CM | POA: Diagnosis not present

## 2024-02-03 DIAGNOSIS — I63511 Cerebral infarction due to unspecified occlusion or stenosis of right middle cerebral artery: Secondary | ICD-10-CM

## 2024-02-03 DIAGNOSIS — Z833 Family history of diabetes mellitus: Secondary | ICD-10-CM

## 2024-02-03 DIAGNOSIS — I2510 Atherosclerotic heart disease of native coronary artery without angina pectoris: Secondary | ICD-10-CM | POA: Diagnosis not present

## 2024-02-03 DIAGNOSIS — I639 Cerebral infarction, unspecified: Secondary | ICD-10-CM | POA: Diagnosis not present

## 2024-02-03 DIAGNOSIS — Z79899 Other long term (current) drug therapy: Secondary | ICD-10-CM | POA: Diagnosis not present

## 2024-02-03 DIAGNOSIS — Z9011 Acquired absence of right breast and nipple: Secondary | ICD-10-CM

## 2024-02-03 DIAGNOSIS — E1169 Type 2 diabetes mellitus with other specified complication: Secondary | ICD-10-CM | POA: Diagnosis not present

## 2024-02-03 DIAGNOSIS — N320 Bladder-neck obstruction: Secondary | ICD-10-CM | POA: Diagnosis present

## 2024-02-03 DIAGNOSIS — Z7401 Bed confinement status: Secondary | ICD-10-CM | POA: Diagnosis not present

## 2024-02-03 DIAGNOSIS — Z8041 Family history of malignant neoplasm of ovary: Secondary | ICD-10-CM

## 2024-02-03 DIAGNOSIS — K5900 Constipation, unspecified: Secondary | ICD-10-CM | POA: Diagnosis not present

## 2024-02-03 DIAGNOSIS — Z955 Presence of coronary angioplasty implant and graft: Secondary | ICD-10-CM

## 2024-02-03 DIAGNOSIS — Z950 Presence of cardiac pacemaker: Secondary | ICD-10-CM

## 2024-02-03 DIAGNOSIS — Z978 Presence of other specified devices: Secondary | ICD-10-CM

## 2024-02-03 LAB — GLUCOSE, CAPILLARY
Glucose-Capillary: 138 mg/dL — ABNORMAL HIGH (ref 70–99)
Glucose-Capillary: 237 mg/dL — ABNORMAL HIGH (ref 70–99)
Glucose-Capillary: 243 mg/dL — ABNORMAL HIGH (ref 70–99)
Glucose-Capillary: 255 mg/dL — ABNORMAL HIGH (ref 70–99)
Glucose-Capillary: 184 mg/dL — ABNORMAL HIGH (ref 70–99)

## 2024-02-03 MED ORDER — ESCITALOPRAM OXALATE 10 MG PO TABS
10.0000 mg | ORAL_TABLET | Freq: Every day | ORAL | Status: DC
  Administered 2024-02-04 – 2024-02-20 (×17): 10 mg via ORAL
  Filled 2024-02-03 (×17): qty 1

## 2024-02-03 MED ORDER — CLOPIDOGREL BISULFATE 75 MG PO TABS
75.0000 mg | ORAL_TABLET | Freq: Every day | ORAL | Status: AC
  Administered 2024-02-04 – 2024-02-07 (×4): 75 mg via ORAL
  Filled 2024-02-03 (×4): qty 1

## 2024-02-03 MED ORDER — GUAIFENESIN-DM 100-10 MG/5ML PO SYRP: 5.0000 mL | ORAL_SOLUTION | Freq: Four times a day (QID) | ORAL | Status: DC | PRN

## 2024-02-03 MED ORDER — CHLORHEXIDINE GLUCONATE CLOTH 2 % EX PADS
6.0000 | MEDICATED_PAD | Freq: Every day | CUTANEOUS | Status: DC
  Administered 2024-02-04: 6 via TOPICAL

## 2024-02-03 MED ORDER — ALUM & MAG HYDROXIDE-SIMETH 200-200-20 MG/5ML PO SUSP: 30.0000 mL | ORAL | Status: DC | PRN

## 2024-02-03 MED ORDER — INSULIN ASPART 100 UNIT/ML IJ SOLN
0.0000 [IU] | Freq: Three times a day (TID) | INTRAMUSCULAR | Status: DC
  Administered 2024-02-03: 5 [IU] via SUBCUTANEOUS
  Administered 2024-02-04: 1 [IU] via SUBCUTANEOUS
  Administered 2024-02-04: 5 [IU] via SUBCUTANEOUS
  Administered 2024-02-04: 3 [IU] via SUBCUTANEOUS
  Administered 2024-02-05: 2 [IU] via SUBCUTANEOUS

## 2024-02-03 MED ORDER — PROCHLORPERAZINE MALEATE 5 MG PO TABS: 5.0000 mg | ORAL_TABLET | Freq: Four times a day (QID) | ORAL | Status: DC | PRN

## 2024-02-03 MED ORDER — INSULIN GLARGINE-YFGN 100 UNIT/ML ~~LOC~~ SOLN
15.0000 [IU] | Freq: Every day | SUBCUTANEOUS | Status: DC
  Administered 2024-02-04 – 2024-02-05 (×2): 15 [IU] via SUBCUTANEOUS
  Filled 2024-02-03 (×3): qty 0.15

## 2024-02-03 MED ORDER — ACETAMINOPHEN 650 MG RE SUPP: 650.0000 mg | Freq: Four times a day (QID) | RECTAL | Status: DC | PRN

## 2024-02-03 MED ORDER — AMOXICILLIN-POT CLAVULANATE 875-125 MG PO TABS
1.0000 | ORAL_TABLET | Freq: Two times a day (BID) | ORAL | Status: AC
  Administered 2024-02-03: 1 via ORAL
  Filled 2024-02-03: qty 1

## 2024-02-03 MED ORDER — ASPIRIN 81 MG PO TBEC
81.0000 mg | DELAYED_RELEASE_TABLET | Freq: Every day | ORAL | Status: DC
  Administered 2024-02-04 – 2024-02-20 (×17): 81 mg via ORAL
  Filled 2024-02-03 (×17): qty 1

## 2024-02-03 MED ORDER — ACETAMINOPHEN 325 MG PO TABS
650.0000 mg | ORAL_TABLET | Freq: Four times a day (QID) | ORAL | Status: DC | PRN
  Administered 2024-02-09 – 2024-02-17 (×2): 650 mg via ORAL
  Filled 2024-02-03 (×2): qty 2

## 2024-02-03 MED ORDER — QUETIAPINE FUMARATE 25 MG PO TABS: 25.0000 mg | ORAL_TABLET | Freq: Every day | ORAL | Status: DC

## 2024-02-03 MED ORDER — ATORVASTATIN CALCIUM 80 MG PO TABS
80.0000 mg | ORAL_TABLET | Freq: Every day | ORAL | Status: DC
  Administered 2024-02-03 – 2024-02-19 (×17): 80 mg via ORAL
  Filled 2024-02-03 (×20): qty 1

## 2024-02-03 MED ORDER — LOSARTAN POTASSIUM 25 MG PO TABS
25.0000 mg | ORAL_TABLET | Freq: Every day | ORAL | Status: DC
  Administered 2024-02-04 – 2024-02-07 (×4): 25 mg via ORAL
  Filled 2024-02-03 (×4): qty 1

## 2024-02-03 MED ORDER — TRAZODONE HCL 50 MG PO TABS
25.0000 mg | ORAL_TABLET | Freq: Every evening | ORAL | Status: DC | PRN
  Filled 2024-02-03: qty 1

## 2024-02-03 MED ORDER — BISACODYL 10 MG RE SUPP
10.0000 mg | Freq: Every day | RECTAL | Status: DC | PRN
  Filled 2024-02-03: qty 1

## 2024-02-03 MED ORDER — PROCHLORPERAZINE 25 MG RE SUPP: 12.5000 mg | Freq: Four times a day (QID) | RECTAL | Status: DC | PRN

## 2024-02-03 MED ORDER — INSULIN ASPART 100 UNIT/ML IJ SOLN
0.0000 [IU] | Freq: Every day | INTRAMUSCULAR | Status: DC
  Administered 2024-02-05: 3 [IU] via SUBCUTANEOUS
  Administered 2024-02-07: 2 [IU] via SUBCUTANEOUS
  Administered 2024-02-18: 3 [IU] via SUBCUTANEOUS
  Filled 2024-02-03: qty 3

## 2024-02-03 MED ORDER — DIPHENHYDRAMINE HCL 25 MG PO CAPS: 25.0000 mg | ORAL_CAPSULE | Freq: Four times a day (QID) | ORAL | Status: DC | PRN

## 2024-02-03 MED ORDER — PROCHLORPERAZINE EDISYLATE 10 MG/2ML IJ SOLN
5.0000 mg | Freq: Four times a day (QID) | INTRAMUSCULAR | Status: DC | PRN
  Filled 2024-02-03: qty 2

## 2024-02-03 MED ORDER — PANTOPRAZOLE SODIUM 40 MG PO TBEC
40.0000 mg | DELAYED_RELEASE_TABLET | Freq: Every day | ORAL | Status: DC
  Administered 2024-02-03 – 2024-02-19 (×17): 40 mg via ORAL
  Filled 2024-02-03 (×20): qty 1

## 2024-02-03 MED ORDER — ENOXAPARIN SODIUM 40 MG/0.4ML IJ SOSY
40.0000 mg | PREFILLED_SYRINGE | INTRAMUSCULAR | Status: DC
  Administered 2024-02-04 – 2024-02-20 (×17): 40 mg via SUBCUTANEOUS
  Filled 2024-02-03 (×17): qty 0.4

## 2024-02-03 MED ORDER — FLEET ENEMA RE ENEM: 1.0000 | ENEMA | Freq: Once | RECTAL | Status: DC | PRN

## 2024-02-03 MED ORDER — OXYCODONE HCL 5 MG PO TABS
2.5000 mg | ORAL_TABLET | ORAL | Status: DC | PRN
  Filled 2024-02-03: qty 1

## 2024-02-03 MED ORDER — QUETIAPINE FUMARATE 25 MG PO TABS
25.0000 mg | ORAL_TABLET | Freq: Every day | ORAL | Status: DC
  Administered 2024-02-03 – 2024-02-06 (×4): 25 mg via ORAL
  Filled 2024-02-03 (×5): qty 1

## 2024-02-03 NOTE — Plan of Care (Signed)
  Problem: Education: Goal: Knowledge of General Education information will improve Description: Including pain rating scale, medication(s)/side effects and non-pharmacologic comfort measures Outcome: Progressing   Problem: Clinical Measurements: Goal: Ability to maintain clinical measurements within normal limits will improve Outcome: Progressing   Problem: Clinical Measurements: Goal: Will remain free from infection Outcome: Progressing   Problem: Clinical Measurements: Goal: Diagnostic test results will improve Outcome: Progressing   Problem: Coping: Goal: Level of anxiety will decrease Outcome: Progressing   Problem: Nutrition: Goal: Adequate nutrition will be maintained Outcome: Progressing

## 2024-02-03 NOTE — Progress Notes (Signed)
 Inpatient Rehab Admissions Coordinator:    I have insurance approval and a bed available for pt to admit to CIR today. Dr. Caleen in agreement and [facility] aware.  I will notify pt/family and make arrangements.    Reche Lowers, PT, DPT Admissions Coordinator [phone] 02/03/24  1:10 PM

## 2024-02-03 NOTE — Progress Notes (Signed)
 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  CT showed concerns of right lower lobe bronchopneumonia likely from aspiration, T12 compression fracture and early cirrhosis.  Blood cultures are negative, antibiotics have been transitioned to Augmentin , last day 10/24.  Patient has refused speech and swallow evaluation.  Seen by palliative care service and decision is for patient to go to CIR. Awaiting CIR placement  Assessment & Plan:   Acute metabolic encephalopathy: Improved This is likely secondary to aspiration pneumonia and hypotension.  CT head negative, EEG showed encephalopathy.  Metabolic workup has mostly been unrevealing.  Home mentation is improved   Severe sepsis due to aspiration right lower lobe pneumonia: Present on admission.   Present on admission.  Initially treated with IV meropenem now transition to p.o. Augmentin .  EOT 10/24.  Patient has declined speech and swallow evaluation/MBS. -Prior provider discussed urine culture results with ID, Dr. Fleeta Rothman.  Exchanging Foley sufficient for funguria.  -Aspiration precaution -Palliative care   Cognitive impairment with delirium:  Awake and alert but only oriented to self and time  Anxiety and depression: Stable Continue Lexapro  and delirium precaution - Low-dose Seroquel  started per wife's request by the previous provider   IDDM-2 with hyperglycemia: A1c 8.5% on 10/7. -Long-acting, sliding scale necessary.   Normocytic anemia: Relatively stable -Hemoglobin stable   Hx of CVA: Seems to have residual  left-sided weakness.  CT head without acute finding. -Continue ASA, Plavix , Lipitor   -PT/OT-recommended SNF   Chronic HFrEF:  -TTE in 11/2023 with LVEF of 30 to 35%.  Appears euvolemic on exam.      Bladder outlet obstruction with chronic Foley -Foley catheter exchanged on admission -Outpatient follow-up with urology   Hypotension/history of hypertension: Now hypotensive. - Resume home losartan , IV as needed   HAGMA: Likely due to lactic acidosis versus DKA.  Resolved.   Cholelithiasis-seems chronic.  Doubt cholecystitis.  No GI symptoms. - Outpatient follow-up   T12 compression fracture - Supportive care   Hypokalemia/hypomagnesemia -Monitor replenish K and Mg as appropriate   Goal of care: Patient remains DNR   Body mass index is 24.82 kg/m.  DVT prophylaxis: enoxaparin  (LOVENOX ) injection 40 mg Start: 01/29/24 1000    Code Status: Limited: Do not attempt resuscitation (DNR) -DNR-LIMITED -Do Not Intubate/DNI  Family Communication:   Status is: Inpatient Remains inpatient appropriate because: CIR placement  PT Follow up Recs: Acute Inpatient Rehab (3hours/Day)02/01/2024 1602  Subjective: Feeling well no complaints.  Awaiting CIR placement  Examination:  General exam: Appears calm and comfortable  Respiratory system: Clear to auscultation. Respiratory effort normal. Cardiovascular system: S1 & S2 heard, RRR. No JVD, murmurs, rubs, gallops or clicks. No pedal edema. Gastrointestinal system: Abdomen is nondistended, soft and nontender. No organomegaly or masses felt. Normal bowel sounds heard. Central nervous system: Alert and oriented. No focal neurological deficits. Extremities: Symmetric 5 x 5 power. Skin: No rashes, lesions or ulcers Psychiatry: Judgement and insight appear normal. Mood & affect appropriate.                Diet Orders (From admission, onward)     Start  Ordered   01/28/24 2117  Diet regular Room service appropriate? Yes; Fluid  consistency: Thin  Diet effective now       Question Answer Comment  Room service appropriate? Yes   Fluid consistency: Thin      01/28/24 2119            Objective: Vitals:   02/02/24 1927 02/03/24 0003 02/03/24 0402 02/03/24 0742  BP: (!) 152/73 (!) 153/74 (!) 141/71 (!) 146/69  Pulse: 78 75 60 63  Resp: 17 18 18 14   Temp: 98.8 F (37.1 C) 98.6 F (37 C) 97.8 F (36.6 C) 98.8 F (37.1 C)  TempSrc: Oral Oral Oral Oral  SpO2: 98% 98% 98% 98%  Weight:      Height:        Intake/Output Summary (Last 24 hours) at 02/03/2024 1117 Last data filed at 02/02/2024 1841 Gross per 24 hour  Intake --  Output 275 ml  Net -275 ml   Filed Weights   01/28/24 2038  Weight: 83 kg    Scheduled Meds:  amoxicillin -clavulanate  1 tablet Oral Q12H   aspirin  EC  81 mg Oral Daily   atorvastatin   80 mg Oral QHS   Chlorhexidine  Gluconate Cloth  6 each Topical Daily   clopidogrel   75 mg Oral Q breakfast   enoxaparin  (LOVENOX ) injection  40 mg Subcutaneous Q24H   escitalopram   10 mg Oral Daily   insulin  aspart  0-5 Units Subcutaneous QHS   insulin  aspart  0-9 Units Subcutaneous TID WC   insulin  glargine-yfgn  15 Units Subcutaneous Daily   losartan   25 mg Oral Daily   pantoprazole   40 mg Oral QHS   QUEtiapine   25 mg Oral QHS   sodium chloride  flush  3 mL Intravenous Q12H   Continuous Infusions:  Nutritional status     Body mass index is 24.82 kg/m.  Data Reviewed:   CBC: Recent Labs  Lab 01/28/24 1411 01/29/24 0256 01/30/24 0250 01/31/24 0759 02/01/24 0232  WBC 12.4* 11.2* 6.0 7.1 6.9  HGB 12.6* 11.3* 10.8* 11.6* 11.0*  HCT 39.1 33.8* 32.1* 34.5* 33.0*  MCV 90.5 89.2 89.2 87.8 88.2  PLT 304 254 228 253 267   Basic Metabolic Panel: Recent Labs  Lab 01/28/24 1411 01/29/24 0256 01/30/24 0250 01/31/24 0759 02/01/24 0232  NA 137 137 137 137 136  K 4.1 3.9 3.2* 3.8 3.7  CL 102 103 105 105 104  CO2 15* 23 24 25 23   GLUCOSE 154* 115* 236* 156* 272*  BUN 32*  26* 19 11 12   CREATININE 1.19 0.91 0.76 0.67 0.78  CALCIUM  9.7 9.2 8.9 9.2 8.7*  MG  --  1.4* 2.0 1.5* 1.6*  PHOS  --   --   --  2.2* 2.4*   GFR: Estimated Creatinine Clearance: 84.9 mL/min (by C-G formula based on SCr of 0.78 mg/dL). Liver Function Tests: Recent Labs  Lab 01/28/24 2329 01/30/24 0250 01/31/24 0759 02/01/24 0232  AST 26 26  --   --   ALT 33 28  --   --   ALKPHOS 92 75  --   --   BILITOT 1.5* 1.3*  --   --   PROT 5.7* 4.7*  --   --   ALBUMIN 3.0* 2.4* 2.6* 2.4*   No results for input(s): LIPASE, AMYLASE in the last 168 hours. Recent Labs  Lab 01/29/24 0256  AMMONIA 37*   Coagulation Profile: Recent Labs  Lab 01/28/24 2329  INR 1.0  Cardiac Enzymes: Recent Labs  Lab 01/28/24 1411  CKTOTAL 44*  CKMB 3.1   BNP (last 3 results) Recent Labs    01/16/24 1659  PROBNP 465.0*   HbA1C: No results for input(s): HGBA1C in the last 72 hours. CBG: Recent Labs  Lab 02/02/24 1128 02/02/24 1610 02/02/24 2057 02/03/24 0609 02/03/24 1049  GLUCAP 237* 173* 206* 138* 237*   Lipid Profile: No results for input(s): CHOL, HDL, LDLCALC, TRIG, CHOLHDL, LDLDIRECT in the last 72 hours. Thyroid  Function Tests: No results for input(s): TSH, T4TOTAL, FREET4, T3FREE, THYROIDAB in the last 72 hours. Anemia Panel: No results for input(s): VITAMINB12, FOLATE, FERRITIN, TIBC, IRON, RETICCTPCT in the last 72 hours. Sepsis Labs: Recent Labs  Lab 01/28/24 1813 01/28/24 2329 01/29/24 0256  PROCALCITON  --  <0.10 <0.10  LATICACIDVEN 3.6* 2.2* 1.2    Recent Results (from the past 240 hours)  Culture, blood (Routine X 2) w Reflex to ID Panel     Status: None   Collection Time: 01/28/24  6:13 PM   Specimen: BLOOD RIGHT HAND  Result Value Ref Range Status   Specimen Description BLOOD RIGHT HAND  Final   Special Requests   Final    BOTTLES DRAWN AEROBIC ONLY Blood Culture results may not be optimal due to an inadequate volume  of blood received in culture bottles   Culture   Final    NO GROWTH 5 DAYS Performed at [facility], [address], [place], [place] [address]    Report Status 02/02/2024 FINAL  Final  Culture, blood (Routine X 2) w Reflex to ID Panel     Status: None   Collection Time: 01/28/24  6:13 PM   Specimen: BLOOD LEFT HAND  Result Value Ref Range Status   Specimen Description BLOOD LEFT HAND  Final   Special Requests   Final    BOTTLES DRAWN AEROBIC ONLY Blood Culture results may not be optimal due to an inadequate volume of blood received in culture bottles   Culture   Final    NO GROWTH 5 DAYS Performed at [facility], [address] [place], [place] [address]    Report Status 02/02/2024 FINAL  Final  Urine Culture (for pregnant, neutropenic or urologic patients or patients with an indwelling urinary catheter)     Status: Abnormal   Collection Time: 01/28/24  6:45 PM   Specimen: Urine, Catheterized  Result Value Ref Range Status   Specimen Description URINE, CATHETERIZED  Final   Special Requests   Final    NONE Performed at [facility], [address] [place], [place] [address]    Culture 60,000 COLONIES/mL YEAST (A)  Final   Report Status 01/30/2024 FINAL  Final  Remove and replace urinary cath (placed > 5 days) then obtain urine culture from new indwelling urinary catheter.     Status: Abnormal   Collection Time: 01/28/24  8:42 PM   Specimen: Urine, Catheterized  Result Value Ref Range Status   Specimen Description URINE, CATHETERIZED  Final   Special Requests   Final    NONE Performed at [facility], [address] [place], [place] [address]    Culture >=100,000 COLONIES/mL YEAST (A)  Final   Report Status 01/29/2024 FINAL  Final  MRSA Next Gen by PCR, Nasal     Status: None   Collection Time: 01/28/24  9:19 PM   Specimen: Nasal Mucosa; Nasal Swab  Result Value Ref Range Status  MRSA by PCR Next Gen NOT DETECTED NOT DETECTED Final     Comment: (NOTE) The GeneXpert MRSA Assay (FDA approved for NASAL specimens only), is one component of a comprehensive MRSA colonization surveillance program. It is not intended to diagnose MRSA infection nor to guide or monitor treatment for MRSA infections. Test performance is not FDA approved in patients less than 63 years old. Performed at [facility], [address] [place], [place] [address]   Culture, blood (x 2)     Status: None   Collection Time: 01/28/24 11:29 PM   Specimen: BLOOD RIGHT HAND  Result Value Ref Range Status   Specimen Description BLOOD RIGHT HAND  Final   Special Requests   Final    BOTTLES DRAWN AEROBIC ONLY Blood Culture results may not be optimal due to an inadequate volume of blood received in culture bottles   Culture   Final    NO GROWTH 5 DAYS Performed at [facility], [address], [place], [place] [address]    Report Status 02/02/2024 FINAL  Final  Culture, blood (x 2)     Status: None   Collection Time: 01/28/24 11:29 PM   Specimen: BLOOD RIGHT ARM  Result Value Ref Range Status   Specimen Description BLOOD RIGHT ARM  Final   Special Requests   Final    BOTTLES DRAWN AEROBIC ONLY Blood Culture results may not be optimal due to an inadequate volume of blood received in culture bottles   Culture   Final    NO GROWTH 5 DAYS Performed at [facility], [address], [place], [place] [address]    Report Status 02/02/2024 FINAL  Final         Radiology Studies: No results found.         LOS: 6 days   Time spent= 35 mins    Burgess JAYSON Dare, MD [facility]  If 7PM-7AM, please contact night-coverage  02/03/2024, 11:17 AM

## 2024-02-03 NOTE — Progress Notes (Signed)
 Inpatient Rehab Admissions Coordinator:   Awaiting insurance approval.    Reche Lowers, PT, DPT Admissions Coordinator [phone] 02/03/24  8:27 AM

## 2024-02-03 NOTE — H&P (Shared)
 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 Patient presented to [facility] on 01/16/2024, with complaints of left-sided weakness. An initial CT scan of the head showed no acute abnormalities, and a chest X-ray was negative. Urinalysis was abnormal with a urine culture positive for ESBL E. Coli, and the patient received IV meropenem that was to transition to fosfomycin. Initial lab results were significant for a glucose level of 164 mg/dL, procalcitonin of 534 ng/mL, WBC of 8.5 x 10^9/L, and hemoglobin of 10 g/dL. An EKG showed junctional rhythm with right bundle branch block. Neurology was consulted for further evaluation. The patient was transferred to [facility] on 01/18/2024. An MRI was positive for a right pontine infarct with a possible punctate petechial hemorrhage. A CTA of the head and neck showed severe right posterior cerebral artery stenosis. Neurology felt this was a lacunar infarct and recommended to continue aspirin  and Plavix  for 3 weeks then aspirin  alone.Patient continues with Foley catheter.   The patient was admitted to CIR on 01/24/2024 where he became hypotensive and unresponsive requiring transfer back to acute medicine services.  He was found to be hypotensive with a systolic BP in the 50s.  Code stroke was called and patient was evaluated by neurology who suspected encephalopathy rather than CVA.  Workup: CT head with no acute findings, EEG suggestive of encephalopathy, labs with metabolic acidosis, increased BUN to creatinine ratio, no leukocytosis, lactic acid 3.6, pyuria and bacteria.  CT showed concerns of right lower  lobe bronchopneumonia likely from aspiration, T12 compression fracture and early cirrhosis. Blood cultures are negative, antibiotics have been transitioned to Augmentin , last day 10/24. Patient has refused speech and swallow evaluation. Seen by palliative care service and decision is for patient to return back to CIR.   Prior to hospitalization the patient required assistance with ADLs and had impaired functional cognition.  Currently requiring max assist for functional mobility and ADLs and mod assist to power up and balance for transfers. Therapy evaluations completed due to patient decreased functional mobility was admitted for a comprehensive rehab program.     ROS Past Medical History:  Diagnosis Date   BPH (benign prostatic hyperplasia)    CTS (carpal tunnel syndrome)    Depression    Diabetes mellitus    Essential hypertension    GERD (gastroesophageal reflux disease)    History of breast cancer    Hypercholesteremia    Neuropathy    Stroke [facility])    Past Surgical History:  Procedure Laterality Date   CATARACT EXTRACTION, BILATERAL  01/18/2017   CORONARY STENT INTERVENTION N/A 08/10/2023   Procedure: CORONARY STENT INTERVENTION;  Surgeon: Darron Deatrice LABOR, MD;  Location: [facility] INVASIVE CV LAB;  Service: Cardiovascular;  Laterality: N/A;   LEFT HEART CATH AND CORONARY ANGIOGRAPHY N/A 08/10/2023   Procedure: LEFT HEART CATH AND CORONARY ANGIOGRAPHY;  Surgeon: Darron Deatrice LABOR, MD;  Location: [facility] INVASIVE CV LAB;  Service: Cardiovascular;  Laterality: N/A;   LITHOTRIPSY     MASTECTOMY Right 05/02/2018   PACEMAKER IMPLANT N/A 05/13/2023   Procedure: PACEMAKER IMPLANT;  Surgeon: Kennyth Chew, MD;  Location: [facility] INVASIVE CV LAB;  Service: Cardiovascular;  Laterality: N/A;  TEMPORARY PACEMAKER N/A 05/11/2023   Procedure: TEMPORARY PACEMAKER;  Surgeon: Swaziland, Peter M, MD;  Location: [facility] INVASIVE CV LAB;  Service: Cardiovascular;  Laterality: N/A;   TONSILLECTOMY AND ADENOIDECTOMY      TRANSESOPHAGEAL ECHOCARDIOGRAM (CATH LAB) N/A 12/08/2023   Procedure: TRANSESOPHAGEAL ECHOCARDIOGRAM;  Surgeon: Raford Riggs, MD;  Location: [facility] INVASIVE CV LAB;  Service: Cardiovascular;  Laterality: N/A;   Family History  Problem Relation Age of Onset   Lymphoma Mother    Cancer Mother    Diabetes Father    Stroke Father    Ovarian cancer Sister    Cancer Sister    Cancer Paternal Grandmother    Social History:  reports that he has never smoked. He has never used smokeless tobacco. He reports that he does not drink alcohol and does not use drugs. Allergies:  Allergies  Allergen Reactions   Ramipril Anaphylaxis   Adhesive [Tape] Rash   Other Diarrhea    Severe intolerance to Chemotherapy in the past.   Sertraline Other (See Comments)    Extreme headaches   Medications Prior to Admission  Medication Sig Dispense Refill   acetaminophen  (TYLENOL ) 325 MG tablet Take 2 tablets (650 mg total) by mouth every 6 (six) hours as needed for mild pain (pain score 1-3) or fever (or Fever >/= 101). (Patient taking differently: Take 650 mg by mouth every 8 (eight) hours as needed for mild pain (pain score 1-3) or fever (or Fever >/= 101).)     Ascorbic Acid  (VITAMIN C) 1000 MG tablet Take 1,000 mg by mouth 2 (two) times a week.     aspirin  EC 81 MG tablet Take 1 tablet (81 mg total) by mouth daily. Swallow whole.     atorvastatin  (LIPITOR ) 80 MG tablet Take 1 tablet (80 mg total) by mouth at bedtime. 90 tablet 1   bisacodyl  (DULCOLAX) 10 MG suppository Place 10 mg rectally daily as needed for moderate constipation.     clopidogrel  (PLAVIX ) 75 MG tablet Take 1 tablet (75 mg total) by mouth daily with breakfast for 21 days.     clotrimazole -betamethasone  (LOTRISONE ) cream Apply 1 Application topically daily. (Patient not taking: Reported on 01/17/2024) 30 g 0   cyanocobalamin  1000 MCG tablet Take 1 tablet (1,000 mcg total) by mouth daily. 100 tablet 0   escitalopram  (LEXAPRO ) 10 MG tablet Take  1 tablet (10 mg total) by mouth daily. 90 tablet 1   ferrous sulfate  (FEROSUL) 325 (65 FE) MG tablet Take 1 tablet (325 mg total) by mouth daily with breakfast. 100 tablet 0   finasteride  (PROSCAR ) 5 MG tablet Take 1 tablet (5 mg total) by mouth daily. *Need appointment for future refills.* 30 tablet 0   hyoscyamine  (LEVSIN  SL) 0.125 MG SL tablet Place 1 tablet (0.125 mg total) under the tongue every 4 (four) hours as needed for bladder spasms. (Patient not taking: Reported on 01/17/2024) 20 tablet 0   insulin  aspart (NOVOLOG ) 100 UNIT/ML injection Inject 3 Units into the skin 3 (three) times daily with meals.     insulin  aspart (NOVOLOG ) 100 UNIT/ML injection Inject 0-9 Units into the skin 3 (three) times daily with meals. Sliding scale insulin  Less than 70 initiate hypoglycemia protocol 70-120  0 units 120-150 1 unit 151-200 2 units 201-250 3 units 251-300 5 units 301-350 7 units 351-400 9 units  Greater than 400 call MD     insulin  glargine (LANTUS ) 100 UNIT/ML injection Inject 0.15 mLs (15 Units total) into the skin daily. 10 mL 11  losartan  (COZAAR ) 25 MG tablet Take 1 tablet (25 mg total) by mouth daily. 60 tablet 0   magnesium  hydroxide (MILK OF MAGNESIA) 400 MG/5ML suspension Take 30 mLs by mouth daily as needed for mild constipation.     [Paused] metFORMIN  (GLUCOPHAGE ) 500 MG tablet Take 1 tablet (500 mg total) by mouth 2 (two) times daily with a meal. 60 tablet 0   Multiple Vitamin (MULTIVITAMIN) tablet Take 1 tablet by mouth 2 (two) times a week.     ondansetron  (ZOFRAN -ODT) 4 MG disintegrating tablet Take 1 tablet (4 mg total) by mouth every 8 (eight) hours as needed for nausea or vomiting. 20 tablet 0   pantoprazole  (PROTONIX ) 40 MG tablet Take 1 tablet (40 mg total) by mouth at bedtime. 30 tablet 0   polyethylene glycol (MIRALAX  / GLYCOLAX ) 17 g packet Take 17 g by mouth daily.     PRESCRIPTION MEDICATION Place 1 suppository rectally daily as needed (Constipation).      senna-docusate (SENOKOT-S) 8.6-50 MG tablet Take 1 tablet by mouth 2 (two) times daily. 100 tablet 0      Home: Home Living Family/patient expects to be discharged to:: Inpatient rehab Living Arrangements: Spouse/significant other Available Help at Discharge: Family Type of Home: House Home Access: Stairs to enter Secretary/administrator of Steps: 4 Entrance Stairs-Rails: Can reach both Home Layout: Able to live on main level with bedroom/bathroom, Multi-level Alternate Level Stairs-Number of Steps: He reported sleeping in a recliner on the main level of the home. All bedrooms are upstairs Alternate Level Stairs-Rails: Right, Left, Can reach both Bathroom Shower/Tub: Health visitor: Standard Home Equipment: Agricultural consultant (2 wheels), [employer] - single point, BSC/3in1, Wheelchair - power Additional Comments: DC from SNF 10/6 after tx for comp fx and admitted with CVA same date   Functional History: Prior Function Prior Level of Function : Needs assist Mobility Comments: reports using RW ADLs Comments: ind with ADL:  Functional Status:  Mobility: Bed Mobility Overal bed mobility: Needs Assistance Bed Mobility: Supine to Sit Rolling: Mod assist Sidelying to sit: Mod assist Supine to sit: Mod assist Sit to supine: Mod assist General bed mobility comments: pt able to initiate movement of LB to pivot to left side of bed, physical assist to elevate trunk and scoot fully to EOB Transfers Overall transfer level: Needs assistance Equipment used: Rolling walker (2 wheels), Ambulation equipment used Transfers: Sit to/from Stand Sit to Stand: Mod assist, +2 physical assistance Bed to/from chair/wheelchair/BSC transfer type:: Step pivot Step pivot transfers: Contact guard assist, Min assist Transfer via Lift Equipment: Stedy General transfer comment: initial stand from bed with mod +2 assist, mod cues for hand placement, sequence and safety with physical assist to place LLE  on floor. 2nd stand from chair with min assist, use of rail and mod cues. Pt sitting in chair prematurely prior to brakes locked on 2nd trial Ambulation/Gait Ambulation/Gait assistance: Mod assist, +2 physical assistance Gait Distance (Feet): 18 Feet Assistive device: Rolling walker (2 wheels) Gait Pattern/deviations: Step-through pattern, Decreased stride length, Knee flexed in stance - left, Trunk flexed, Narrow base of support General Gait Details: physical assist for balance with posterior left bias that improved with gait progression, chair follow, cues to increase BOS and extend knee/trunk. Pt walked 18' then 6' limited by fatigue Gait velocity: decr Gait velocity interpretation: <1.8 ft/sec, indicate of risk for recurrent falls    ADL: ADL Overall ADL's : Needs assistance/impaired Eating/Feeding: Set up, Bed level Grooming: Minimal assistance, Standing Grooming Details (  indicate cue type and reason): standing at sink, min assist to balance x2 seated rest breaks Upper Body Bathing: Sitting, Cueing for compensatory techniques, Minimal assistance, Moderate assistance Lower Body Bathing: Maximal assistance, Bed level Upper Body Dressing : Moderate assistance, Sitting Lower Body Dressing: Maximal assistance Toilet Transfer: Maximal assistance, Ambulation, Rolling walker (2 wheels) Toilet Transfer Details (indicate cue type and reason): short distance simulated in room Toileting- Clothing Manipulation and Hygiene: Maximal assistance Functional mobility during ADLs: Maximal assistance, Rolling walker (2 wheels) General ADL Comments: Limited standing balance and sitting balance, improved awareness and incorporating LUE into tasks  Cognition: Cognition Orientation Level: Oriented to person, Oriented to place, Oriented to time, Oriented to situation Cognition Arousal: Alert Behavior During Therapy: Flat affect, Agitated  Physical Exam: Blood pressure (!) 146/69, pulse 63, temperature  98.8 F (37.1 C), temperature source Oral, resp. rate 14, height 6' (1.829 m), weight 83 kg, SpO2 98%. Physical Exam  Results for orders placed or performed during the hospital encounter of 01/28/24 (from the past 48 hours)  Glucose, capillary     Status: Abnormal   Collection Time: 02/01/24  3:49 PM  Result Value Ref Range   Glucose-Capillary 332 (H) 70 - 99 mg/dL    Comment: Glucose reference range applies only to samples taken after fasting for at least 8 hours.  Glucose, capillary     Status: Abnormal   Collection Time: 02/01/24  9:13 PM  Result Value Ref Range   Glucose-Capillary 222 (H) 70 - 99 mg/dL    Comment: Glucose reference range applies only to samples taken after fasting for at least 8 hours.  Glucose, capillary     Status: Abnormal   Collection Time: 02/02/24  6:00 AM  Result Value Ref Range   Glucose-Capillary 165 (H) 70 - 99 mg/dL    Comment: Glucose reference range applies only to samples taken after fasting for at least 8 hours.  Glucose, capillary     Status: Abnormal   Collection Time: 02/02/24 11:28 AM  Result Value Ref Range   Glucose-Capillary 237 (H) 70 - 99 mg/dL    Comment: Glucose reference range applies only to samples taken after fasting for at least 8 hours.  Glucose, capillary     Status: Abnormal   Collection Time: 02/02/24  4:10 PM  Result Value Ref Range   Glucose-Capillary 173 (H) 70 - 99 mg/dL    Comment: Glucose reference range applies only to samples taken after fasting for at least 8 hours.  Glucose, capillary     Status: Abnormal   Collection Time: 02/02/24  8:57 PM  Result Value Ref Range   Glucose-Capillary 206 (H) 70 - 99 mg/dL    Comment: Glucose reference range applies only to samples taken after fasting for at least 8 hours.  Glucose, capillary     Status: Abnormal   Collection Time: 02/03/24  6:09 AM  Result Value Ref Range   Glucose-Capillary 138 (H) 70 - 99 mg/dL    Comment: Glucose reference range applies only to samples taken  after fasting for at least 8 hours.  Glucose, capillary     Status: Abnormal   Collection Time: 02/03/24 10:49 AM  Result Value Ref Range   Glucose-Capillary 237 (H) 70 - 99 mg/dL    Comment: Glucose reference range applies only to samples taken after fasting for at least 8 hours.   No results found.    Blood pressure (!) 146/69, pulse 63, temperature 98.8 F (37.1 C), temperature source Oral, resp.  rate 14, height 6' (1.829 m), weight 83 kg, SpO2 98%.  Medical Problem List and Plan: 1. Functional deficits secondary to ***  -patient may *** shower  -ELOS/Goals: ***  2.  Antithrombotics: -DVT/anticoagulation:  Pharmaceutical: Lovenox   -antiplatelet therapy: Aspirin  and  Plavix  until 10/28 then aspirin  alone.  3. Pain Management: Oxycodone  and Tylenol  as needed  4. Mood/Behavior/Sleep: LCSW to follow for evaluation and support when available.   -antipsychotic agents: Lexapro  10 mg daily - Delirium : Seroquel  25 mg nightly  5. Neuropsych/cognition: This patient is not capable of making decisions on their own behalf.  6. Skin/Wound Care: Routine pressure relief measures             -Stage 2 Sacral wound--wound care orders placed--Gerhardt's butt cream +Zinc  PO  7. Fluids/Electrolytes/Nutrition: Monitor intake and output.  CBC/CMET in a.m.             - SLP consult             -GERD: Continue PPI  8.  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  Atorvastatin  80 mg daily.   9.  Acute metabolic encephalopathy: Secondary to aspiration pneumonia and hypotension.  Mentation improved continue to monitor.  10.Hypomagnesia: Mg 1.4<2.0<1.5< now 1.6 monitor and replace.    11.  Bladder outlet obstruction: Chronic foley catheter last changed 10/18  -continue CHG and foley catheter care.  - f/u with urology outpatient.    12.  HTN: resumed home Losartan  25 mg daily, monitor BP with increased activity for hypotension    13.  T2DM: A1c 8.5% on  10/7  -monitor cbgs ac/hs with Novolog  SSI and Lantus  15 units.   14.CAD/HLD: Atorvastatin   15.  Severe sepsis due to aspiration RLL PNA: Initially treated with IV meropenem now transition to p.o. Augmentin  ends 10/24.  ID consulted, Dr. Fleeta Rothman recommended exchanging Foley due to fungemia.  - Aspiration precautions  - Palliative care consult: Patient remains DNR limited  16. Cholelithiasis: Maybe chronic, no current GI symptoms. Continue to monitor.   17.  Heart block: S/p PPM      ***  Daphne LOISE Satterfield, NP 02/03/2024

## 2024-02-03 NOTE — Progress Notes (Signed)
 Physical Therapy Treatment Patient Details Name: John [name] MRN: [id number] DOB: 03/16/1946 Today's Date: 02/03/2024   History of Present Illness 78 y.o. male adm 01/28/24 from [facility] hypotensive and unresponsive with RLL PNA, encephalopathy. PMHx: 10/6-10/14 admission for Lt weakness with Rt pontine infarct.T12 comp fx, HTN, HLD, T2DM, CVA, cognitive deficits, CAD, heart block s/p PPM, HFrEF, DVT, BPH, urinary retention with Foley catheter    PT Comments  Pt initially agitated with therapy arrival despite RN letting pt know ahead of time. Pt able to progress to limited gait with continued posterior left lean requiring physical assist with all transfers and gait. Pt continues to lack safety awareness as lying in bed at 30degrees on arrival eating despite prior education for sitting upright. Pt in chair to finish breakfast end of session. Education for HEP and continued progression provided. Will continue to follow. Patient will benefit from intensive inpatient follow-up therapy, >3 hours/day    If plan is discharge home, recommend the following: A lot of help with walking and/or transfers;A lot of help with bathing/dressing/bathroom;Assistance with cooking/housework;Direct supervision/assist for medications management;Direct supervision/assist for financial management;Assist for transportation;Help with stairs or ramp for entrance   Can travel by private vehicle     No  Equipment Recommendations  Wheelchair cushion (measurements PT);Hospital bed;Wheelchair (measurements PT);Rolling walker (2 wheels)    Recommendations for Other Services       Precautions / Restrictions Precautions Precautions: Fall Recall of Precautions/Restrictions: Impaired Precaution/Restrictions Comments: L hemiparesis     Mobility  Bed Mobility Overal bed mobility: Needs Assistance Bed Mobility: Supine to Sit     Supine to sit: Mod assist     General bed mobility comments: pt able to initiate movement  of LB to pivot to left side of bed, physical assist to elevate trunk and scoot fully to EOB    Transfers Overall transfer level: Needs assistance   Transfers: Sit to/from Stand Sit to Stand: Mod assist, +2 physical assistance           General transfer comment: initial stand from bed with mod +2 assist, mod cues for hand placement, sequence and safety with physical assist to place LLE on floor. 2nd stand from chair with min assist, use of rail and mod cues. Pt sitting in chair prematurely prior to brakes locked on 2nd trial    Ambulation/Gait Ambulation/Gait assistance: Mod assist, +2 physical assistance Gait Distance (Feet): 18 Feet Assistive device: Rolling walker (2 wheels) Gait Pattern/deviations: Step-through pattern, Decreased stride length, Knee flexed in stance - left, Trunk flexed, Narrow base of support   Gait velocity interpretation: <1.8 ft/sec, indicate of risk for recurrent falls   General Gait Details: physical assist for balance with posterior left bias that improved with gait progression, chair follow, cues to increase BOS and extend knee/trunk. Pt walked 41' then 6' limited by fatigue   Stairs             Wheelchair Mobility     Tilt Bed    Modified Rankin (Stroke Patients Only)       Balance Overall balance assessment: Needs assistance Sitting-balance support: Feet supported, Bilateral upper extremity supported Sitting balance-Leahy Scale: Poor Sitting balance - Comments: min progressiong to CGA Postural control: Posterior lean, Left lateral lean Standing balance support: Bilateral upper extremity supported Standing balance-Leahy Scale: Poor Standing balance comment: RW and external support  Communication Communication Communication: Impaired Factors Affecting Communication: Hearing impaired  Cognition Arousal: Alert Behavior During Therapy: Flat affect, Agitated   PT - Cognitive impairments:  Attention, Difficult to assess, Problem solving, Safety/Judgement, Orientation   Orientation impairments: Situation, Time                   PT - Cognition Comments: pt initially agitated but then flat throughout remainder of session, slow to process, decreased safety awareness, repetitive cueing Following commands: Impaired Following commands impaired: Follows one step commands with increased time    Cueing Cueing Techniques: Verbal cues, Tactile cues, Visual cues, Gestural cues  Exercises General Exercises - Lower Extremity Long Arc Quad: AROM, Both, 10 reps Hip ABduction/ADduction: AROM, AAROM, Right, Left, 10 reps, Seated (AAROM on LLE) Hip Flexion/Marching: AROM, Both, 20 reps    General Comments        Pertinent Vitals/Pain Pain Assessment Pain Assessment: No/denies pain    Home Living                          Prior Function            PT Goals (current goals can now be found in the care plan section) Progress towards PT goals: Progressing toward goals    Frequency    Min 2X/week      PT Plan      Co-evaluation              AM-PAC PT 6 Clicks Mobility   Outcome Measure  Help needed turning from your back to your side while in a flat bed without using bedrails?: A Lot Help needed moving from lying on your back to sitting on the side of a flat bed without using bedrails?: A Lot Help needed moving to and from a bed to a chair (including a wheelchair)?: A Lot Help needed standing up from a chair using your arms (e.g., wheelchair or bedside chair)?: A Lot Help needed to walk in hospital room?: Total Help needed climbing 3-5 steps with a railing? : Total 6 Click Score: 10    End of Session Equipment Utilized During Treatment: Gait belt Activity Tolerance: Patient tolerated treatment well Patient left: in chair;with call bell/phone within reach;with chair alarm set Nurse Communication: Mobility status PT Visit Diagnosis:  Unsteadiness on feet (R26.81);Muscle weakness (generalized) (M62.81);Other abnormalities of gait and mobility (R26.89);Hemiplegia and hemiparesis;Difficulty in walking, not elsewhere classified (R26.2) Hemiplegia - Right/Left: Left Hemiplegia - dominant/non-dominant: Non-dominant Hemiplegia - caused by: Cerebral infarction     Time: 9241-9175 PT Time Calculation (min) (ACUTE ONLY): 26 min  Charges:    $Gait Training: 8-22 mins $Therapeutic Activity: 8-22 mins PT General Charges $$ ACUTE PT VISIT: 1 Visit                     Lenoard SQUIBB, PT Acute Rehabilitation Services Office: [phone]    Lenoard NOVAK Philipp Callegari 02/03/2024, 10:52 AM

## 2024-02-03 NOTE — Progress Notes (Addendum)
 Inpatient Rehabilitation Admission Medication Review by a Pharmacist  A complete drug regimen review was completed for this patient to identify any potential clinically significant medication issues.  High Risk Drug Classes Is patient taking? Indication by Medication  Antipsychotic Yes Compazine : N/V  Anticoagulant Yes Lovenox : VTE ppx  Antibiotic Yes Augmentin : aspiration pneumonia (last dose 10/24)  Opioid Yes Oxycodone : pain  Antiplatelet Yes Aspirin /Plavix  until 10/28, then aspirin  alone: stroke ppx  Hypoglycemics/insulin  Yes Insulin : diabetes  Vasoactive Medication Yes Losartan : HTN  Chemotherapy No   Other Yes Tylenol : pain Lexapro , Seroquel : mood Lipitor : HLD Protonix : GERD Trazodone : sleep Benadryl : itching Bisacodyl , Fleet: constipation Mylanta: indigestion Robitussin: cough     Type of Medication Issue Identified Description of Issue Recommendation(s)  Drug Interaction(s) (clinically significant)     Duplicate Therapy     Allergy     No Medication Administration End Date     Incorrect Dose     Additional Drug Therapy Needed     Significant med changes from prior encounter (inform family/care partners about these prior to discharge). Methylphenidate used during previous rehab admit. PTA B12, iron, proscar , and metfiormin have been held  Restart or discontinue as appropriate. Communicate medication changes with patient/family at discharge  Other       Clinically significant medication issues were identified that warrant physician communication and completion of prescribed/recommended actions by midnight of the next day:  No   Time spent performing this drug regimen review (minutes): 30   Thank you for allowing pharmacy to be a part of this patient's care.   Bascom JAYSON Louder, PharmD 02/03/2024 4:43 PM     **Pharmacist phone directory can be found on [email or web address] listed under [facility]**

## 2024-02-03 NOTE — H&P (Addendum)
 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 Patient presented to [facility] on 01/16/2024, with complaints of left-sided weakness. An initial CT scan of the head showed no acute abnormalities, and a chest X-ray was negative. Urinalysis was abnormal with a urine culture positive for ESBL E. Coli, and the patient received IV meropenem that was to transition to fosfomycin. Initial lab results were significant for a glucose level of 164 mg/dL, procalcitonin of 534 ng/mL, WBC of 8.5 x 10^9/L, and hemoglobin of 10 g/dL. An EKG showed junctional rhythm with right bundle branch block. Neurology was consulted for further evaluation. The patient was transferred to [facility] on 01/18/2024. An MRI was positive for a right pontine infarct with a possible punctate petechial hemorrhage. A CTA of the head and neck showed severe right posterior cerebral artery stenosis. Neurology felt this was a lacunar infarct and recommended to continue aspirin  and Plavix  for 3 weeks then aspirin  alone.Patient continues with Foley catheter.    The patient was admitted to CIR on 01/24/2024 where he became hypotensive and unresponsive requiring transfer back to acute medicine services.  He was found to be hypotensive with a systolic BP in the 50s.  Code stroke was called and patient was evaluated by neurology who suspected encephalopathy rather than CVA.  Workup: CT head with no acute findings, EEG suggestive of encephalopathy, labs with metabolic acidosis, increased BUN to creatinine ratio, no leukocytosis, lactic acid 3.6, pyuria and bacteria.  CT showed concerns of right  lower lobe bronchopneumonia likely from aspiration, T12 compression fracture and early cirrhosis. Blood cultures are negative, antibiotics have been transitioned to Augmentin , last day 10/24. Patient has refused speech and swallow evaluation. Seen by palliative care service and decision is for patient to return back to CIR.    Prior to hospitalization the patient required assistance with ADLs and had impaired functional cognition.  Currently requiring max assist for functional mobility and ADLs and mod assist to power up and balance for transfers. Therapy evaluations completed due to patient decreased functional mobility was admitted for a comprehensive rehab program.        ROS:  - CONSTITUTIONAL: Denies weight loss, fever and chills.  - HEENT: Denies changes in vision and hearing.  - RESPIRATORY: Denies SOB and cough.  - CV: Denies palpitations and CP.  - GI: Denies abdominal pain, nausea, vomiting and diarrhea.  - MSK: Denies myalgia and joint pain.  - SKIN: Denies rash and pruritus.  - NEUROLOGICAL: Denies headache and syncope.  - PSYCHIATRIC: Denies recent changes in mood. Denies anxiety and depression.        Past Medical History:  Diagnosis Date   BPH (benign prostatic hyperplasia)     CTS (carpal tunnel syndrome)     Depression     Diabetes mellitus     Essential hypertension     GERD (gastroesophageal reflux disease)     History of breast cancer     Hypercholesteremia     Neuropathy     Stroke [facility])               Past Surgical History:  Procedure Laterality Date   CATARACT  EXTRACTION, BILATERAL   01/18/2017   CORONARY STENT INTERVENTION N/A 08/10/2023    Procedure: CORONARY STENT INTERVENTION;  Surgeon: Darron Deatrice LABOR, MD;  Location: [facility] INVASIVE CV LAB;  Service: Cardiovascular;  Laterality: N/A;   LEFT HEART CATH AND CORONARY ANGIOGRAPHY N/A 08/10/2023    Procedure: LEFT HEART CATH AND CORONARY ANGIOGRAPHY;  Surgeon: Darron Deatrice LABOR, MD;  Location: [facility]  INVASIVE CV LAB;  Service: Cardiovascular;  Laterality: N/A;   LITHOTRIPSY       MASTECTOMY Right 05/02/2018   PACEMAKER IMPLANT N/A 05/13/2023    Procedure: PACEMAKER IMPLANT;  Surgeon: Kennyth Chew, MD;  Location: [facility] INVASIVE CV LAB;  Service: Cardiovascular;  Laterality: N/A;   TEMPORARY PACEMAKER N/A 05/11/2023    Procedure: TEMPORARY PACEMAKER;  Surgeon: Swazil[place], Peter M, MD;  Location: [facility] INVASIVE CV LAB;  Service: Cardiovascular;  Laterality: N/A;   TONSILLECTOMY AND ADENOIDECTOMY       TRANSESOPHAGEAL ECHOCARDIOGRAM (CATH LAB) N/A 12/08/2023    Procedure: TRANSESOPHAGEAL ECHOCARDIOGRAM;  Surgeon: Raford Riggs, MD;  Location: [facility] INVASIVE CV LAB;  Service: Cardiovascular;  Laterality: N/A;             Family History  Problem Relation Age of Onset   Lymphoma Mother     Cancer Mother     Diabetes Father     Stroke Father     Ovarian cancer Sister     Cancer Sister     Cancer Paternal Grandmother          Social History:  reports that he has never smoked. He has never used smokeless tobacco. He reports that he does not drink alcohol and does not use drugs. Allergies:  Allergies       Allergies  Allergen Reactions   Ramipril Anaphylaxis   Adhesive [Tape] Rash   Other Diarrhea      Severe intolerance to Chemotherapy in the past.   Sertraline Other (See Comments)      Extreme headaches            Medications Prior to Admission  Medication Sig Dispense Refill   acetaminophen  (TYLENOL ) 325 MG tablet Take 2 tablets (650 mg total) by mouth every 6 (six) hours as needed for mild pain (pain score 1-3) or fever (or Fever >/= 101). (Patient taking differently: Take 650 mg by mouth every 8 (eight) hours as needed for mild pain (pain score 1-3) or fever (or Fever >/= 101).)       Ascorbic Acid  (VITAMIN C) 1000 MG tablet Take 1,000 mg by mouth 2 (two) times a week.       aspirin  EC 81 MG tablet Take 1 tablet (81 mg total) by mouth daily. Swallow whole.       atorvastatin   (LIPITOR ) 80 MG tablet Take 1 tablet (80 mg total) by mouth at bedtime. 90 tablet 1   bisacodyl  (DULCOLAX) 10 MG suppository Place 10 mg rectally daily as needed for moderate constipation.       clopidogrel  (PLAVIX ) 75 MG tablet Take 1 tablet (75 mg total) by mouth daily with breakfast for 21 days.       clotrimazole -betamethasone  (LOTRISONE ) cream Apply 1 Application topically daily. (Patient not taking: Reported on 01/17/2024) 30 g 0   cyanocobalamin  1000 MCG tablet Take 1 tablet (1,000 mcg total) by mouth daily. 100 tablet 0   escitalopram  (LEXAPRO ) 10 MG tablet Take 1 tablet (10 mg total) by mouth daily. 90 tablet 1   ferrous sulfate  (FEROSUL) 325 (65 FE) MG tablet Take 1  tablet (325 mg total) by mouth daily with breakfast. 100 tablet 0   finasteride  (PROSCAR ) 5 MG tablet Take 1 tablet (5 mg total) by mouth daily. *Need appointment for future refills.* 30 tablet 0   hyoscyamine  (LEVSIN  SL) 0.125 MG SL tablet Place 1 tablet (0.125 mg total) under the tongue every 4 (four) hours as needed for bladder spasms. (Patient not taking: Reported on 01/17/2024) 20 tablet 0   insulin  aspart (NOVOLOG ) 100 UNIT/ML injection Inject 3 Units into the skin 3 (three) times daily with meals.       insulin  aspart (NOVOLOG ) 100 UNIT/ML injection Inject 0-9 Units into the skin 3 (three) times daily with meals. Sliding scale insulin  Less than 70 initiate hypoglycemia protocol 70-120  0 units 120-150 1 unit 151-200 2 units 201-250 3 units 251-300 5 units 301-350 7 units 351-400 9 units  Greater than 400 call MD       insulin  glargine (LANTUS ) 100 UNIT/ML injection Inject 0.15 mLs (15 Units total) into the skin daily. 10 mL 11   losartan  (COZAAR ) 25 MG tablet Take 1 tablet (25 mg total) by mouth daily. 60 tablet 0   magnesium  hydroxide (MILK OF MAGNESIA) 400 MG/5ML suspension Take 30 mLs by mouth daily as needed for mild constipation.       [Paused] metFORMIN  (GLUCOPHAGE ) 500 MG tablet Take 1 tablet (500 mg total) by  mouth 2 (two) times daily with a meal. 60 tablet 0   Multiple Vitamin (MULTIVITAMIN) tablet Take 1 tablet by mouth 2 (two) times a week.       ondansetron  (ZOFRAN -ODT) 4 MG disintegrating tablet Take 1 tablet (4 mg total) by mouth every 8 (eight) hours as needed for nausea or vomiting. 20 tablet 0   pantoprazole  (PROTONIX ) 40 MG tablet Take 1 tablet (40 mg total) by mouth at bedtime. 30 tablet 0   polyethylene glycol (MIRALAX  / GLYCOLAX ) 17 g packet Take 17 g by mouth daily.       PRESCRIPTION MEDICATION Place 1 suppository rectally daily as needed (Constipation).       senna-docusate (SENOKOT-S) 8.6-50 MG tablet Take 1 tablet by mouth 2 (two) times daily. 100 tablet 0              Home: Home Living Family/patient expects to be discharged to:: Inpatient rehab Living Arrangements: Spouse/significant other Available Help at Discharge: Family Type of Home: House Home Access: Stairs to enter Secretary/administrator of Steps: 4 Entrance Stairs-Rails: Can reach both Home Layout: Able to live on main level with bedroom/bathroom, Multi-level Alternate Level Stairs-Number of Steps: He reported sleeping in a recliner on the main level of the home. All bedrooms are upstairs Alternate Level Stairs-Rails: Right, Left, Can reach both Bathroom Shower/Tub: Health visitor: Standard Home Equipment: Agricultural consultant (2 wheels), [employer] - single point, BSC/3in1, Wheelchair - power Additional Comments: DC from SNF 10/6 after tx for comp fx and admitted with CVA same date   Functional History: Prior Function Prior Level of Function : Needs assist Mobility Comments: reports using RW ADLs Comments: ind with ADL:   Functional Status:  Mobility: Bed Mobility Overal bed mobility: Needs Assistance Bed Mobility: Supine to Sit Rolling: Mod assist Sidelying to sit: Mod assist Supine to sit: Mod assist Sit to supine: Mod assist General bed mobility comments: pt able to initiate movement of LB  to pivot to left side of bed, physical assist to elevate trunk and scoot fully to EOB Transfers Overall transfer level: Needs assistance Equipment used:  Rolling walker (2 wheels), Ambulation equipment used Transfers: Sit to/from Stand Sit to Stand: Mod assist, +2 physical assistance Bed to/from chair/wheelchair/BSC transfer type:: Step pivot Step pivot transfers: Contact guard assist, Min assist Transfer via Lift Equipment: Stedy General transfer comment: initial stand from bed with mod +2 assist, mod cues for hand placement, sequence and safety with physical assist to place LLE on floor. 2nd stand from chair with min assist, use of rail and mod cues. Pt sitting in chair prematurely prior to brakes locked on 2nd trial Ambulation/Gait Ambulation/Gait assistance: Mod assist, +2 physical assistance Gait Distance (Feet): 18 Feet Assistive device: Rolling walker (2 wheels) Gait Pattern/deviations: Step-through pattern, Decreased stride length, Knee flexed in stance - left, Trunk flexed, Narrow base of support General Gait Details: physical assist for balance with posterior left bias that improved with gait progression, chair follow, cues to increase BOS and extend knee/trunk. Pt walked 18' then 6' limited by fatigue Gait velocity: decr Gait velocity interpretation: <1.8 ft/sec, indicate of risk for recurrent falls   ADL: ADL Overall ADL's : Needs assistance/impaired Eating/Feeding: Set up, Bed level Grooming: Minimal assistance, Standing Grooming Details (indicate cue type and reason): standing at sink, min assist to balance x2 seated rest breaks Upper Body Bathing: Sitting, Cueing for compensatory techniques, Minimal assistance, Moderate assistance Lower Body Bathing: Maximal assistance, Bed level Upper Body Dressing : Moderate assistance, Sitting Lower Body Dressing: Maximal assistance Toilet Transfer: Maximal assistance, Ambulation, Rolling walker (2 wheels) Toilet Transfer Details  (indicate cue type and reason): short distance simulated in room Toileting- Clothing Manipulation and Hygiene: Maximal assistance Functional mobility during ADLs: Maximal assistance, Rolling walker (2 wheels) General ADL Comments: Limited standing balance and sitting balance, improved awareness and incorporating LUE into tasks   Cognition: Cognition Orientation Level: Oriented to person, Oriented to place, Oriented to time, Oriented to situation Cognition Arousal: Alert Behavior During Therapy: Flat affect, Agitated   Physical Exam: Blood pressure (!) 146/69, pulse 63, temperature 98.8 F (37.1 C), temperature source Oral, resp. rate 14, height 6' (1.829 m), weight 83 kg, SpO2 98%. Physical Exam   Constitutional: No apparent distress. Appropriate appearance for age.  HENT: No JVD. Neck Supple. Trachea midline. Atraumatic, normocephalic. Eyes: PERRLA. EOMI. Visual fields grossly intact.  Cardiovascular: RRR, no murmurs/rub/gallops. No Edema. Peripheral pulses 2+  Respiratory: Mild crackles and expiratory wheezes. On RA.  Abdomen: + bowel sounds, normoactive. No distention or tenderness.  GU: Not examined. +Foley, draining clear urine.  Skin: C/D/I. No apparent lesions.  MSK:      No apparent deformity.       Neurologic exam:  Cognition: AAO to person, place, time and event.   + Difficulty with abstraction, example cannot interpret the grasses greener or it is raining cats and dogs - Can add change, can spell world backwards  Language: Fluent, No substitutions or neoglisms. No dysarthria. Names 3/3 objects correctly.   Memory: Recalls 3/3 objects at 5 minutes. No apparent deficits   Insight: Good  insight into current condition.  Mood: Pleasant affect, appropriate mood.  Sensation: To light touch intact in BL UEs and LEs  Reflexes: 2+ in BL UE and LEs. Negative Hoffman's and babinski signs bilaterally.  CN: 2-12 grossly intact.  Coordination: Mild left upper extremity  ataxia Spasticity: MAS 0 in all extremities.        Strength:                RUE: 5/5 SA, 5/5 EF, 5/5 EE, 5/5 WE, 5/5 FF,  5/5 FA                LUE:  5-/5 SA, 5-/5 EF, 5-/5 EE, 5-/5 WE, 5-/5 FF, 5-/5 FA                RLE: 5/5 HF, 5/5 KE, 5/5  DF, 5/5  EHL, 5/5  PF                 LLE:  3/5 HF, 3/5 KE, 4/5  DF, 4/5  EHL, 4/5  PF     Lab Results Last 48 Hours        Results for orders placed or performed during the hospital encounter of 01/28/24 (from the past 48 hours)  Glucose, capillary     Status: Abnormal    Collection Time: 02/01/24  3:49 PM  Result Value Ref Range    Glucose-Capillary 332 (H) 70 - 99 mg/dL      Comment: Glucose reference range applies only to samples taken after fasting for at least 8 hours.  Glucose, capillary     Status: Abnormal    Collection Time: 02/01/24  9:13 PM  Result Value Ref Range    Glucose-Capillary 222 (H) 70 - 99 mg/dL      Comment: Glucose reference range applies only to samples taken after fasting for at least 8 hours.  Glucose, capillary     Status: Abnormal    Collection Time: 02/02/24  6:00 AM  Result Value Ref Range    Glucose-Capillary 165 (H) 70 - 99 mg/dL      Comment: Glucose reference range applies only to samples taken after fasting for at least 8 hours.  Glucose, capillary     Status: Abnormal    Collection Time: 02/02/24 11:28 AM  Result Value Ref Range    Glucose-Capillary 237 (H) 70 - 99 mg/dL      Comment: Glucose reference range applies only to samples taken after fasting for at least 8 hours.  Glucose, capillary     Status: Abnormal    Collection Time: 02/02/24  4:10 PM  Result Value Ref Range    Glucose-Capillary 173 (H) 70 - 99 mg/dL      Comment: Glucose reference range applies only to samples taken after fasting for at least 8 hours.  Glucose, capillary     Status: Abnormal    Collection Time: 02/02/24  8:57 PM  Result Value Ref Range    Glucose-Capillary 206 (H) 70 - 99 mg/dL      Comment: Glucose reference range  applies only to samples taken after fasting for at least 8 hours.  Glucose, capillary     Status: Abnormal    Collection Time: 02/03/24  6:09 AM  Result Value Ref Range    Glucose-Capillary 138 (H) 70 - 99 mg/dL      Comment: Glucose reference range applies only to samples taken after fasting for at least 8 hours.  Glucose, capillary     Status: Abnormal    Collection Time: 02/03/24 10:49 AM  Result Value Ref Range    Glucose-Capillary 237 (H) 70 - 99 mg/dL      Comment: Glucose reference range applies only to samples taken after fasting for at least 8 hours.      Imaging Results (Last 48 hours)  No results found.         Blood pressure (!) 146/69, pulse 63, temperature 98.8 F (37.1 C), temperature source Oral, resp. rate 14, height 6' (1.829 m), weight 83  kg, SpO2 98%.   Medical Problem List and Plan: 1. Functional deficits secondary to acute pontine infarct             -patient may  shower             -ELOS/Goals: 9-12 days Min A to supervision              - stable to admit to CIR    2.  Antithrombotics: -DVT/anticoagulation:  Pharmaceutical: Lovenox              -antiplatelet therapy: Aspirin  and  Plavix  until 10/28 then aspirin  alone.   3. Pain Management: Oxycodone  and Tylenol  as needed   4. Mood/Behavior/Sleep: LCSW to follow for evaluation and support when available.              -antipsychotic agents: Lexapro  10 mg daily - Delirium : Seroquel  25 mg nightly   5. Neuropsych/cognition: This patient is not capable of making decisions on their own behalf.   6. Skin/Wound Care: Routine pressure relief measures             -Stage 2 Sacral wound--wound care orders placed--Gerhardt's butt cream +Zinc  PO   7. Fluids/Electrolytes/Nutrition: Monitor intake and output.  CBC/CMET in a.m.             - SLP consult             -GERD: Continue PPI   8.  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  Atorvastatin  80 mg daily.   9.  Acute  metabolic encephalopathy: Secondary to aspiration pneumonia and hypotension.  Mentation improved continue to monitor.   10.Hypomagnesia: Mg 1.4<2.0<1.5< now 1.6 monitor and replace.    11.  Bladder outlet obstruction: Chronic foley catheter last changed 10/18             -continue CHG and foley catheter care.  - f/u with urology outpatient.    12.  HTN: resumed home Losartan  25 mg daily, monitor BP with increased activity for hypotension    13.  T2DM: A1c 8.5% on 10/7  -monitor cbgs ac/hs with Novolog  SSI and Lantus  15 units.    14.CAD/HLD: Atorvastatin    15.  Severe sepsis due to aspiration RLL PNA: Initially treated with IV meropenem now transition to p.o. Augmentin  ends 10/24.  ID consulted, Dr. Fleeta Rothman recommended exchanging Foley due to fungemia.             - Aspiration precautions             - Palliative care consult: Patient remains DNR limited   16. Cholelithiasis: Maybe chronic, no current GI symptoms. Continue to monitor.    17.  Heart block: S/p PPM       Daphne LOISE Satterfield, NP 02/03/2024  I have examined the patient independently and edited the note for HPI, ROS, exam, assessment, and plan as appropriate. I am in agreement with the above recommendations.   Joesph JAYSON Likes, DO 02/03/2024

## 2024-02-03 NOTE — Progress Notes (Addendum)
 [name], DO  Physician Physical Medicine and Rehabilitation   PMR Pre-admission    Signed   Date of Service: 02/03/2024  1:10 PM  Related encounter: Admission (Current) from 01/28/2024 [facility] 3E HF PCU   Signed     Expand All Collapse All  PMR Admission Coordinator Pre-Admission Assessment   Patient: John [name] is an 78 y.o., male MRN: [id number] DOB: February 14, 1946 Height: 6' (182.9 cm) Weight: 83 kg   Insurance Information HMO:     PPO: yes     PCP:      IPA:      80/20:      OTHER:  PRIMARY: BCBS Medicare      Policy#: [id number]        Subscriber: pt CM Name: Clotilda      Phone#: [phone]     Fax#: [phone] Pre-Cert#: [id number] auth for CIR from [place] with [employer]BS Medicare for admit 10/24 with next review date 10/31.  Updates due to [facility] at fax listed above.        Employer:  Benefits:  Phone #: [phone]     Name:  Eff. Date: 04/13/23     Deduct: $0      Out of Pocket Max: $5900 (met)      Life Max: n/a CIR: $335/day for days 1-5      SNF: 20 full days Outpatient:      Co-Pay: $10/visit Home Health: 100%      Co-Pay:  DME: 80%     Co-Pay: 20% Providers:  SECONDARY:       Policy#:      Phone#:    Artist:       Phone#:    The Engineer, Materials Information Summary" for patients in Inpatient Rehabilitation Facilities with attached "Privacy Act Statement-Health Care Records" was provided and verbally reviewed with: Patient and Family   Emergency Contact Information Contact Information       Name Relation Home Work Mobile    Luna Spouse [phone]   [phone]    Alcides, Nutting     [phone]         Other Contacts       Name Relation Home Work Mobile    Craig,Bonnie Daughter     [phone]           Current Medical History  Patient Admitting Diagnosis: CVA History of Present Illness: Pt is a 78 year old male with medical hx significant for: HTN, hyperlipidemai, DM II, h/o CVA,  cognitive deficits, CAD, previous DVT, urinary retention with Foley catheter in place, PNA. Pt recently admitted to hospital from September 15-22 d/t intractable nausea and vomiting. Imaging showed basal ganglia lacunar infarcts. Pt discharged to [facility]. Pt discharged from [facility] on 01/16/24. Pt presented to [facility] on 01/16/24 d/t left-sided weakness. CT head negative for acute abnormalities. Chest x-ray negative. Abnormal urinalysis. Urine culture grew ESBL E.Coli. Pt started on Rocephin . Neurology consulted. Pt transferred to [facility] on 01/18/24. MRI was positive for right pontine infarct with possible punctate petechial hemorrhage. 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 Therapy evaluations completed and CIR recommended d/t pt's deficits in functional mobility and pt was admitted to CIR on 10/14.  On 10/18 he developed AMS with somnolence and was transferred back to the acute setting for workup which revealed aspiration PNA, T12 compression fracture, and early cirrhosis.  He is on augmentin .  Therapy evals completed and pt recommended to  return to AIR>    --Complete NIHSS TOTAL: 3   Patient's medical record from [facility] has been reviewed by the rehabilitation admission coordinator and physician. Past Medical History      Past Medical History:  Diagnosis Date   BPH (benign prostatic hyperplasia)     CTS (carpal tunnel syndrome)     Depression     Diabetes mellitus     Essential hypertension     GERD (gastroesophageal reflux disease)     History of breast cancer     Hypercholesteremia     Neuropathy     Stroke [facility])            Has the patient had major surgery during 100 days prior to admission? No   Family History   family history includes Cancer in his mother, paternal grandmother, and sister; Diabetes in his father; Lymphoma in his mother; Ovarian cancer in his sister; Stroke in his father.   Current  Medications  Current Medications    Current Facility-Administered Medications:    acetaminophen  (TYLENOL ) tablet 650 mg, 650 mg, Oral, Q6H PRN **OR** acetaminophen  (TYLENOL ) suppository 650 mg, 650 mg, Rectal, Q6H PRN, Opyd, Timothy S, MD, 650 mg at 01/28/24 2252   amoxicillin -clavulanate (AUGMENTIN ) 875-125 MG per tablet 1 tablet, 1 tablet, Oral, Q12H, Gonfa, Taye T, MD, 1 tablet at 02/02/24 0908   aspirin  EC tablet 81 mg, 81 mg, Oral, Daily, Opyd, Timothy S, MD, 81 mg at 02/02/24 0908   atorvastatin  (LIPITOR ) tablet 80 mg, 80 mg, Oral, QHS, Opyd, Timothy S, MD, 80 mg at 02/01/24 2133   Chlorhexidine  Gluconate Cloth 2 % PADS 6 each, 6 each, Topical, Daily, Opyd, Evalene RAMAN, MD, 6 each at 02/02/24 0909   clopidogrel  (PLAVIX ) tablet 75 mg, 75 mg, Oral, Q breakfast, Hammons, Kimberly B, RPH, 75 mg at 02/02/24 0908   enoxaparin  (LOVENOX ) injection 40 mg, 40 mg, Subcutaneous, Q24H, Opyd, Timothy S, MD, 40 mg at 02/02/24 0908   escitalopram  (LEXAPRO ) tablet 10 mg, 10 mg, Oral, Daily, Opyd, Timothy S, MD, 10 mg at 02/02/24 0908   fentaNYL  (SUBLIMAZE ) injection 12.5 mcg, 12.5 mcg, Intravenous, Q2H PRN, Gonfa, Taye T, MD   guaiFENesin  (ROBITUSSIN) 100 MG/5ML liquid 5 mL, 5 mL, Oral, Q4H PRN, Opyd, Timothy S, MD   insulin  aspart (novoLOG ) injection 0-5 Units, 0-5 Units, Subcutaneous, QHS, Opyd, Timothy S, MD, 2 Units at 02/01/24 2133   insulin  aspart (novoLOG ) injection 0-9 Units, 0-9 Units, Subcutaneous, TID WC, Gonfa, Taye T, MD, 2 Units at 02/02/24 0603   insulin  glargine-yfgn (SEMGLEE ) injection 15 Units, 15 Units, Subcutaneous, Daily, Amin, Ankit C, MD, 15 Units at 02/02/24 0908   [COMPLETED] lactulose (CHRONULAC) 10 GM/15ML solution 20 g, 20 g, Oral, BID, 20 g at 01/29/24 2134 **FOLLOWED BY** lactulose (CHRONULAC) 10 GM/15ML solution 20 g, 20 g, Oral, BID PRN, Gonfa, Taye T, MD   losartan  (COZAAR ) tablet 25 mg, 25 mg, Oral, Daily, Gonfa, Taye T, MD, 25 mg at 02/02/24 0908   oxyCODONE  (Oxy  IR/ROXICODONE ) immediate release tablet 2.5-5 mg, 2.5-5 mg, Oral, Q4H PRN, Opyd, Timothy S, MD   pantoprazole  (PROTONIX ) EC tablet 40 mg, 40 mg, Oral, QHS, Opyd, Timothy S, MD, 40 mg at 02/01/24 2133   prochlorperazine  (COMPAZINE ) injection 10 mg, 10 mg, Intravenous, Q4H PRN, Opyd, Timothy S, MD, 10 mg at 01/28/24 2254   QUEtiapine  (SEROQUEL ) tablet 25 mg, 25 mg, Oral, QHS, Gonfa, Taye T, MD, 25 mg at 02/01/24 2133   sodium chloride  flush (  NS) 0.9 % injection 3 mL, 3 mL, Intravenous, Q12H, Opyd, Evalene RAMAN, MD, 3 mL at 02/02/24 0909     Patients Current Diet:  Diet Order                  Diet regular Room service appropriate? Yes; Fluid consistency: Thin  Diet effective now                         Precautions / Restrictions Precautions Precautions: Fall, Back Precaution Booklet Issued: No Precaution/Restrictions Comments: L hemiparesis Restrictions Weight Bearing Restrictions Per Provider Order: No    Has the patient had 2 or more falls or a fall with injury in the past year? Yes   Prior Activity Level Limited Community (1-2x/wk): MD appointments   Prior Functional Level Self Care: Did the patient need help bathing, dressing, using the toilet or eating? Needed some help   Indoor Mobility: Did the patient need assistance with walking from room to room (with or without device)? Independent   Stairs: Did the patient need assistance with internal or external stairs (with or without device)? Independent   Functional Cognition: Did the patient need help planning regular tasks such as shopping or remembering to take medications? Needed some help   Patient Information Are you of Hispanic, Latino/a,or Spanish origin?: A. No, not of Hispanic, Latino/a, or Spanish origin What is your race?: A. White Do you need or want an interpreter to communicate with a doctor or health care staff?: 0. No   Patient's Response To:  Health Literacy and Transportation Is the patient able to  respond to health literacy and transportation needs?: Yes Health Literacy - How often do you need to have someone help you when you read instructions, pamphlets, or other written material from your doctor or pharmacy?: Never In the past 12 months, has lack of transportation kept you from medical appointments or from getting medications?: No In the past 12 months, has lack of transportation kept you from meetings, work, or from getting things needed for daily living?: No   Home Assistive Devices / Equipment Home Equipment: Agricultural Consultant (2 wheels), [place] - single point, BSC/3in1, Wheelchair - power   Prior Device Use: Indicate devices/aids used by the patient prior to current illness, exacerbation or injury? Walker   Current Functional Level Cognition   Orientation Level: Oriented to person    Extremity Assessment (includes Sensation/Coordination)   RUE Deficits / Details: AROM WFL. Gross strength 4+/5. LUE Deficits / Details: prefers to use RUE, reduced dexterity and general strength in entire LUE, impaired coordination, 3+/5 LUE Sensation: decreased light touch LUE Coordination: decreased fine motor, decreased gross motor  Lower Extremity Assessment: Defer to PT evaluation LLE Deficits / Details: slow movements, decreased control LLE Coordination: decreased gross motor     ADLs   Overall ADL's : Needs assistance/impaired Eating/Feeding: Minimal assistance, Bed level Grooming: Minimal assistance, Sitting Upper Body Bathing: Sitting, Cueing for compensatory techniques, Minimal assistance, Moderate assistance Lower Body Bathing: Maximal assistance, Bed level Upper Body Dressing : Moderate assistance, Sitting Lower Body Dressing: Maximal assistance Toilet Transfer: Maximal assistance, +2 for physical assistance Toileting- Clothing Manipulation and Hygiene: Maximal assistance Functional mobility during ADLs: Maximal assistance, +2 for physical assistance     Mobility   Overal bed  mobility: Needs Assistance Bed Mobility: Rolling, Sidelying to Sit Rolling: Mod assist Sidelying to sit: Mod assist Supine to sit: Mod assist Sit to supine: Mod assist General bed mobility comments:  Assist to elevate trunk and bring hips to EOB     Transfers   Overall transfer level: Needs assistance Equipment used: Rolling walker (2 wheels), Ambulation equipment used Transfers: Sit to/from Stand, Bed to chair/wheelchair/BSC Sit to Stand: Mod assist, +2 safety/equipment Bed to/from chair/wheelchair/BSC transfer type:: Via Lift equipment Transfer via Lift Equipment: Stedy General transfer comment: Used Stedy for bed to chair. From chair stood with walker with mod assist to power up and balance.     Ambulation / Gait / Stairs / Wheelchair Mobility   Ambulation/Gait Ambulation/Gait assistance: Mod assist, +2 safety/equipment Gait Distance (Feet): 35 Feet Assistive device: Rolling walker (2 wheels) Gait Pattern/deviations: Step-through pattern, Decreased stride length, Knee flexed in stance - left, Trunk flexed General Gait Details: Assist for balance and support and 2nd person following with chair. As fatigued lt knee flexed in stance and lt lateral trunk lean. Gait velocity: decr Gait velocity interpretation: <1.31 ft/sec, indicative of household ambulator     Posture / Balance Dynamic Sitting Balance Sitting balance - Comments: min to CGA Balance Overall balance assessment: Needs assistance Sitting-balance support: Feet supported, Bilateral upper extremity supported Sitting balance-Leahy Scale: Poor Sitting balance - Comments: min to CGA Postural control: Right lateral lean Standing balance support: Bilateral upper extremity supported Standing balance-Leahy Scale: Poor Standing balance comment: walker and min to mod assist       Special considerations/life events  Skin Ecchymosis: sacrum/bilateral; Erythema/Redness: buttocks/bilateral; Petechiae: abdomen/bilateral; Stage 2  Pressure Injury/sacrum, Urethral Catheter and Diabetic management Novolog  0-9 units 3x daily with meals, Lantus  5 units daily at bedtime        Previous Home Environment (from acute therapy documentation) Living Arrangements: Spouse/significant other Available Help at Discharge: Family Type of Home: House Home Layout: Able to live on main level with bedroom/bathroom, Multi-level Alternate Level Stairs-Rails: Right, Left, Can reach both Alternate Level Stairs-Number of Steps: He reported sleeping in a recliner on the main level of the home. All bedrooms are upstairs Home Access: Stairs to enter Entrance Stairs-Rails: Can reach both Entrance Stairs-Number of Steps: 4 Bathroom Shower/Tub: Health Visitor: Standard Additional Comments: DC from SNF 10/6 after tx for comp fx and admitted with CVA same date   Previous Home Environment (from acute therapy documentation) Living Arrangements: Spouse/significant other Available Help at Discharge: Family, Available 24 hours/day Type of Home: House Home Layout: Able to live on main level with bedroom/bathroom (has bed in living room) Alternate Level Stairs-Rails: Right, Left, Can reach both Alternate Level Stairs-Number of Steps: He reported sleeping in a recliner on the main level of the home. All bedrooms are upstairs Home Access: Stairs to enter Entrance Stairs-Rails: Can reach both (on stairs in back of the house) Entrance Stairs-Number of Steps: 4 Bathroom Shower/Tub: Health Visitor: Standard Bathroom Accessibility: Yes How Accessible: Accessible via walker Additional Comments: pt was DC from SNF on day of adm 10/6   Discharge Living Setting Plans for Discharge Living Setting: Patient's home Type of Home at Discharge: House Discharge Home Layout: Able to live on main level with bedroom/bathroom (has bed in living room) Discharge Home Access: Stairs to enter Entrance Stairs-Rails: Can reach both (on stairs  in back) Entrance Stairs-Number of Steps: 4 Discharge Bathroom Shower/Tub: Walk-in shower Discharge Bathroom Toilet: Standard Discharge Bathroom Accessibility: Yes How Accessible: Accessible via walker Does the patient have any problems obtaining your medications?: No   Social/Family/Support Systems Patient Roles: Spouse Anticipated Caregiver: Keyan Folson, wife Anticipated Caregiver's Contact Information: [phone] Caregiver Availability: 24/7  Discharge Plan Discussed with Primary Caregiver: Yes Is Caregiver In Agreement with Plan?: Yes Does Caregiver/Family have Issues with Lodging/Transportation while Pt is in Rehab?: No   Goals Patient/Family Goal for Rehab: PT/OT/SLP supervision to mod I Expected length of stay: 9-12 days Pt/Family Agrees to Admission and willing to participate: Yes Program Orientation Provided & Reviewed with Pt/Caregiver Including Roles  & Responsibilities: Yes   Decrease burden of Care through IP rehab admission: NA   Possible need for SNF placement upon discharge: Not anticipated   Patient Condition: This patient's condition remains as documented in the consult dated 10/21, in which the Rehabilitation Physician determined and documented that the patient's condition is appropriate for intensive rehabilitative care in an inpatient rehabilitation facility. Will admit to inpatient rehab today.   Preadmission Screen Completed By:  Reche FORBES Lowers, PT, DPT 02/02/2024 9:31 AM ______________________________________________________________________   Discussed status with Dr. Emeline on 02/03/24  at 1:10 PM  and received approval for admission today.   Admission Coordinator:  Alissa Pharr E Lynk Marti, PT, DPT time 1:10 PM Pattricia  02/03/24     Assessment/Plan: Diagnosis:   R pontine infarct Does the need for close, 24 hr/day Medical supervision in concert with the patient's rehab needs make it unreasonable for this patient to be served in a less intensive setting?  Yes Co-Morbidities requiring supervision/potential complications:  CAD, heart block, dCHF, DM A1c 7.5; foley chronic; RLL pneumonia  Due to bladder management, bowel management, safety, skin/wound care, disease management, medication administration, pain management, and patient education, does the patient require 24 hr/day rehab nursing? Yes Does the patient require coordinated care of a physician, rehab nurse, PT, OT, and SLP to address physical and functional deficits in the context of the above medical diagnosis(es)? Yes Addressing deficits in the following areas: balance, endurance, locomotion, strength, transferring, bowel/bladder control, bathing, dressing, feeding, grooming, and toileting Can the patient actively participate in an intensive therapy program of at least 3 hrs of therapy 5 days a week? Yes The potential for patient to make measurable gains while on inpatient rehab is good Anticipated functional outcomes upon discharge from inpatient rehab: modified independent and supervision PT, modified independent and supervision OT, n/a SLP Estimated rehab length of stay to reach the above functional goals is: 9-12 days Anticipated discharge destination: Home 10. Overall Rehab/Functional Prognosis: good     MD Signature:   Joesph JAYSON Emeline, DO 02/03/2024          Revision History

## 2024-02-03 NOTE — Discharge Summary (Signed)
 Physician Discharge Summary  John [name] FMW:[id number] DOB: Jul 24, 1945 DOA: 01/28/2024  PCP: [name], DO  Admit date: 01/28/2024 Discharge date: 02/03/2024  Admitted From: CIR Disposition:  CIR  Recommendations for Outpatient Follow-up:  Follow up with PCP in 1-2 weeks Please obtain BMP/CBC in one week your next doctors visit.  Bedtime seroquel  added Voiding trail in 2 days for foley removal. Otherwise outptn Urology follow up Last of Augmentin  today   Discharge Condition: Stable CODE STATUS: Full Diet recommendation: Cardiac  Brief/Interim Summary: Brief Narrative:   78 year old with history of CHF with reduced EF, CVA, AVB status post pacemaker, DM2, HTN, HLD, DVT not on anticoagulation, chronic Foley due to bladder outlet obstruction recently hospitalized due to acute ischemic right-sided pontine infarct now admitted from CIR due to prolonged episode of hypotension and unresponsiveness.  CT showed concerns of right lower lobe bronchopneumonia likely from aspiration, T12 compression fracture and early cirrhosis.  Blood cultures are negative, antibiotics have been transitioned to Augmentin , last day 10/24.  Patient has refused speech and swallow evaluation.  Seen by palliative care service and decision is for patient to go to CIR. CIR placement today  Assessment & Plan:   Acute metabolic encephalopathy: Improved This is likely secondary to aspiration pneumonia and hypotension.  CT head negative, EEG showed encephalopathy.  Metabolic workup has mostly been unrevealing.  Home mentation is improved   Severe sepsis due to aspiration right lower lobe pneumonia: Present on admission.   Present on admission.  Initially treated with IV meropenem now transition to p.o. Augmentin .  EOT 10/24.  Patient has declined speech and swallow evaluation/MBS. -Prior provider discussed urine culture results with ID, Dr. Fleeta Rothman.  Exchanging Foley sufficient for funguria.   -Aspiration precaution -Palliative care   Cognitive impairment with delirium:  Awake and alert but only oriented to self and time  Anxiety and depression: Stable Continue Lexapro  and delirium precaution - Low-dose Seroquel  started per wife's request by the previous provider   IDDM-2 with hyperglycemia: A1c 8.5% on 10/7. -Long-acting, sliding scale necessary.   Normocytic anemia: Relatively stable -Hemoglobin stable   Hx of CVA: Seems to have residual left-sided weakness.  CT head without acute finding. -Continue ASA, Plavix , Lipitor   -PT/OT-recommended SNF   Chronic HFrEF:  -TTE in 11/2023 with LVEF of 30 to 35%.  Appears euvolemic on exam.      Bladder outlet obstruction with chronic Foley -Foley catheter exchanged on admission -Outpatient follow-up with urology   Hypotension/history of hypertension: Now hypotensive. - Resume home losartan , IV as needed   HAGMA: Likely due to lactic acidosis versus DKA.  Resolved.   Cholelithiasis-seems chronic.  Doubt cholecystitis.  No GI symptoms. - Outpatient follow-up   T12 compression fracture - Supportive care   Hypokalemia/hypomagnesemia -Monitor replenish K and Mg as appropriate   Goal of care: Patient remains DNR   Body mass index is 24.82 kg/m.  DVT prophylaxis: enoxaparin  (LOVENOX ) injection 40 mg Start: 01/29/24 1000    Code Status: Limited: Do not attempt resuscitation (DNR) -DNR-LIMITED -Do Not Intubate/DNI  Family Communication:   Status is: Inpatient DC to CIR  Examination:  General exam: Appears calm and comfortable  Respiratory system: Clear to auscultation. Respiratory effort normal. Cardiovascular system: S1 & S2 heard, RRR. No JVD, murmurs, rubs, gallops or clicks. No pedal edema. Gastrointestinal system: Abdomen is nondistended, soft and nontender. No organomegaly or masses felt. Normal bowel sounds heard. Central nervous system: Alert and oriented. No focal neurological deficits. Extremities:  Symmetric  5 x 5 power. Skin: No rashes, lesions or ulcers Psychiatry: Judgement and insight appear normal. Mood & affect appropriate.    Discharge Diagnoses:  Principal Problem:   Acute encephalopathy Active Problems:   Type 2 diabetes mellitus with hyperglycemia, with long-term current use of insulin  (HCC)   Cognitive impairment   Essential hypertension   Lactic acidosis   Bladder outlet obstruction   HFrEF (heart failure with reduced ejection fraction) (HCC)   CAD S/P percutaneous coronary angioplasty   History of CVA (cerebrovascular accident)   Unresponsive episode      Discharge Exam: Vitals:   02/03/24 0402 02/03/24 0742  BP: (!) 141/71 (!) 146/69  Pulse: 60 63  Resp: 18 14  Temp: 97.8 F (36.6 C) 98.8 F (37.1 C)  SpO2: 98% 98%   Vitals:   02/02/24 1927 02/03/24 0003 02/03/24 0402 02/03/24 0742  BP: (!) 152/73 (!) 153/74 (!) 141/71 (!) 146/69  Pulse: 78 75 60 63  Resp: 17 18 18 14   Temp: 98.8 F (37.1 C) 98.6 F (37 C) 97.8 F (36.6 C) 98.8 F (37.1 C)  TempSrc: Oral Oral Oral Oral  SpO2: 98% 98% 98% 98%  Weight:      Height:          Discharge Instructions   Allergies as of 02/03/2024       Reactions   Ramipril Anaphylaxis   Adhesive [tape] Rash   Other Diarrhea   Severe intolerance to Chemotherapy in the past.   Sertraline Other (See Comments)   Extreme headaches        Medication List     PAUSE taking these medications    metFORMIN  500 MG tablet Wait to take this until your doctor or other care provider tells you to start again. Commonly known as: GLUCOPHAGE  Take 1 tablet (500 mg total) by mouth 2 (two) times daily with a meal.       STOP taking these medications    Stool Softener/Laxative 50-8.6 MG tablet Generic drug: senna-docusate       TAKE these medications    acetaminophen  325 MG tablet Commonly known as: TYLENOL  Take 2 tablets (650 mg total) by mouth every 6 (six) hours as needed for mild pain (pain score  1-3) or fever (or Fever >/= 101). What changed: when to take this   aspirin  EC 81 MG tablet Take 1 tablet (81 mg total) by mouth daily. Swallow whole.   atorvastatin  80 MG tablet Commonly known as: LIPITOR  Take 1 tablet (80 mg total) by mouth at bedtime.   bisacodyl  10 MG suppository Commonly known as: DULCOLAX Place 10 mg rectally daily as needed for moderate constipation.   clopidogrel  75 MG tablet Commonly known as: PLAVIX  Take 1 tablet (75 mg total) by mouth daily with breakfast for 21 days.   clotrimazole -betamethasone  cream Commonly known as: LOTRISONE  Apply 1 Application topically daily.   cyanocobalamin  1000 MCG tablet Commonly known as: VITAMIN B12 Take 1 tablet (1,000 mcg total) by mouth daily.   escitalopram  10 MG tablet Commonly known as: Lexapro  Take 1 tablet (10 mg total) by mouth daily.   FeroSul 325 (65 Fe) MG tablet Generic drug: ferrous sulfate  Take 1 tablet (325 mg total) by mouth daily with breakfast.   finasteride  5 MG tablet Commonly known as: PROSCAR  Take 1 tablet (5 mg total) by mouth daily. *Need appointment for future refills.*   hyoscyamine  0.125 MG SL tablet Commonly known as: LEVSIN  SL Place 1 tablet (0.125 mg total) under the tongue  every 4 (four) hours as needed for bladder spasms.   insulin  aspart 100 UNIT/ML injection Commonly known as: novoLOG  Inject 3 Units into the skin 3 (three) times daily with meals.   insulin  aspart 100 UNIT/ML injection Commonly known as: novoLOG  Inject 0-9 Units into the skin 3 (three) times daily with meals. Sliding scale insulin  Less than 70 initiate hypoglycemia protocol 70-120  0 units 120-150 1 unit 151-200 2 units 201-250 3 units 251-300 5 units 301-350 7 units 351-400 9 units  Greater than 400 call MD   insulin  glargine 100 UNIT/ML injection Commonly known as: LANTUS  Inject 0.15 mLs (15 Units total) into the skin daily.   losartan  25 MG tablet Commonly known as: COZAAR  Take 1 tablet (25  mg total) by mouth daily.   magnesium  hydroxide 400 MG/5ML suspension Commonly known as: MILK OF MAGNESIA Take 30 mLs by mouth daily as needed for mild constipation.   multivitamin tablet Take 1 tablet by mouth 2 (two) times a week.   ondansetron  4 MG disintegrating tablet Commonly known as: ZOFRAN -ODT Take 1 tablet (4 mg total) by mouth every 8 (eight) hours as needed for nausea or vomiting.   pantoprazole  40 MG tablet Commonly known as: PROTONIX  Take 1 tablet (40 mg total) by mouth at bedtime.   polyethylene glycol 17 g packet Commonly known as: MIRALAX  / GLYCOLAX  Take 17 g by mouth daily.   PRESCRIPTION MEDICATION Place 1 suppository rectally daily as needed (Constipation).   QUEtiapine  25 MG tablet Commonly known as: SEROQUEL  Take 1 tablet (25 mg total) by mouth at bedtime.   vitamin C 1000 MG tablet Take 1,000 mg by mouth 2 (two) times a week.        Follow-up Information     [name], DO Follow up in 1 week(s).   Specialty: Family Medicine Contact information: [address] [place] [address] [phone]                Allergies  Allergen Reactions   Ramipril Anaphylaxis   Adhesive [Tape] Rash   Other Diarrhea    Severe intolerance to Chemotherapy in the past.   Sertraline Other (See Comments)    Extreme headaches    You were cared for by a hospitalist during your hospital stay. If you have any questions about your discharge medications or the care you received while you were in the hospital after you are discharged, you can call the unit and asked to speak with the hospitalist on call if the hospitalist that took care of you is not available. Once you are discharged, your primary care physician will handle any further medical issues. Please note that no refills for any discharge medications will be authorized once you are discharged, as it is imperative that you return to your primary care physician (or  establish a relationship with a primary care physician if you do not have one) for your aftercare needs so that they can reassess your need for medications and monitor your lab values.  You were cared for by a hospitalist during your hospital stay. If you have any questions about your discharge medications or the care you received while you were in the hospital after you are discharged, you can call the unit and asked to speak with the hospitalist on call if the hospitalist that took care of you is not available. Once you are discharged, your primary care physician will handle any further medical issues. Please note that NO REFILLS  for any discharge medications will be authorized once you are discharged, as it is imperative that you return to your primary care physician (or establish a relationship with a primary care physician if you do not have one) for your aftercare needs so that they can reassess your need for medications and monitor your lab values.  Please request your Prim.MD to go over all Hospital Tests and Procedure/Radiological results at the follow up, please get all Hospital records sent to your Prim MD by signing hospital release before you go home.  Get CBC, CMP, 2 view Chest X ray checked  by Primary MD during your next visit or SNF MD in 5-7 days ( we routinely change or add medications that can affect your baseline labs and fluid status, therefore we recommend that you get the mentioned basic workup next visit with your PCP, your PCP may decide not to get them or add new tests based on their clinical decision)  On your next visit with your primary care physician please Get Medicines reviewed and adjusted.  If you experience worsening of your admission symptoms, develop shortness of breath, life threatening emergency, suicidal or homicidal thoughts you must seek medical attention immediately by calling 911 or calling your MD immediately  if symptoms less severe.  You Must read complete  instructions/literature along with all the possible adverse reactions/side effects for all the Medicines you take and that have been prescribed to you. Take any new Medicines after you have completely understood and accpet all the possible adverse reactions/side effects.   Do not drive, operate heavy machinery, perform activities at heights, swimming or participation in water activities or provide baby sitting services if your were admitted for syncope or siezures until you have seen by Primary MD or a Neurologist and advised to do so again.  Do not drive when taking Pain medications.   Procedures/Studies: CT ABDOMEN PELVIS W CONTRAST Result Date: 01/29/2024 EXAM: CT ABDOMEN AND PELVIS WITH CONTRAST 01/29/2024 01:48:25 AM TECHNIQUE: CT of the abdomen and pelvis was performed with the administration of 75 mL of iohexol  (OMNIPAQUE ) 350 MG/ML injection. Multiplanar reformatted images are provided for review. Automated exposure control, iterative reconstruction, and/or weight-based adjustment of the mA/kV was utilized to reduce the radiation dose to as low as reasonably achievable. COMPARISON: CT with iv contrast 12/26/2023 and 12/19/2023. CLINICAL HISTORY: Abdominal pain, acute, nonlocalized; Sepsis. FINDINGS: LOWER CHEST: The cardiac size is normal. There are pacemaker wires in the right heart. There are 3-vessel coronary calcifications and minimal anterior pericardial effusion. In the right lower lobe, there is interval increased bronchial thickening and patchy airspace consolidation much of which is peribronchovascular, consistent with bronchopneumonia with an aspiration component possible. The remaining lung bases are clear. No pneumothorax. LIVER: The liver is 20 cm in length and mildly steatotic with capsular nodularity over the liver undersurface compatible with early cirrhosis. The hepatic portal vein is normal caliber. There is no liver mass. GALLBLADDER AND BILE DUCTS: Clustered small stones in the  proximal gallbladder. Mild gallbladder dilatation similar to prior studies. No wall thickening or ductal dilatation. SPLEEN: Calcified granulomas within a normal sized spleen. No mass. PANCREAS: Partial fatty atrophy in the head, uncinate process, and neck of the pancreas. No pancreatic mass or ductal dilatation. ADRENAL GLANDS: No acute abnormality. KIDNEYS, URETERS AND BLADDER: No stones in the kidneys or ureters. No hydronephrosis. No renal mass enhancement. No perinephric or periureteral stranding. Urinary bladder is catheterized and again is noted circumferentially thickened which is typically either due  to cystitis or hypertrophy. [name] GI AND BOWEL: Stomach demonstrates no acute abnormality. There is no bowel obstruction. PERITONEUM AND RETROPERITONEUM: Minimal ascites in the perihepatic and presacral spaces is unchanged. No free air. VASCULATURE: Aorta is normal in caliber. Patchy aortoiliac calcific plaque without aneurysm or dissection. LYMPH NODES: No lymphadenopathy. REPRODUCTIVE ORGANS: The prostate is enlarged, measuring 5 cm with peripheral calcifications on the left. BONES AND SOFT TISSUES: Osteopenia. Superior endplate anterior wedge compression fracture of the T12 vertebral body is again noted; there is slight interval dorsal buckling of the posterior superior cortex and there is increased anterior vertebral height loss, was 20% now 25%. This was first seen on 12/26/2023 and no new compression fracture is evident. Mild features of bilateral hip arthrosis. . Bridging osteophytes over the anterior left SI joint. No focal soft tissue abnormality. IMPRESSION: 1. Right lower lobe bronchopneumonia with possible aspiration component. 2. Superior endplate anterior wedge compression fracture of T12 with slight interval dorsal buckling superiorly and increased anterior vertebral height loss, now 25 percent. No new compression fracture. 3. Early cirrhosis with mild hepatic steatosis. No liver mass. 4. Clustered  small gallstones with mild gallbladder dilatation, similar to prior studies. No wall thickening or ductal dilatation. 5. Enlarged prostate with peripheral calcifications on the left. 6. Circumferentially thickened bladder most likely either due to cystitis or hypertrophy, unchanged. 7. Aortic atherosclerosis. Coronary atherosclerosis. Electronically signed by: Francis Quam MD 01/29/2024 02:19 AM EDT RP Workstation: [id number]   DG CHEST PORT 1 VIEW Result Date: 01/28/2024 EXAM: 1 VIEW(S) XRAY OF THE CHEST 01/28/2024 06:32:00 PM COMPARISON: 01/16/2024 CLINICAL HISTORY: SOB (shortness of breath) [id number]. Reason for exam: SHOB FINDINGS: LINES, TUBES AND DEVICES: Left chest cardiac pacing device in place. LUNGS AND PLEURA: Low lung volumes. No focal pulmonary opacity. No pulmonary edema. No pleural effusion. No pneumothorax. HEART AND MEDIASTINUM: No acute abnormality of the cardiac and mediastinal silhouettes. BONES AND SOFT TISSUES: No acute osseous abnormality. IMPRESSION: 1. No acute cardiopulmonary process. Electronically signed by: Norman Gatlin MD 01/28/2024 06:40 PM EDT RP Workstation: [id number]   EEG adult Result Date: 01/28/2024 Shelton Arlin KIDD, MD     01/28/2024  5:46 PM Patient Name: Mattheu Brodersen MRN: [id number] Epilepsy Attending: Arlin KIDD Shelton Referring Physician/Provider: Sallyann Normie HERO, MD Date: 01/28/2024 Duration: 22.29 mins Patient history: 78yo M with ams. EEG to evaluate for seizure Level of alertness: Awake AEDs during EEG study: None Technical aspects: This EEG study was done with scalp electrodes positioned according to the 10-20 International system of electrode placement. Electrical activity was reviewed with band pass filter of 1-70Hz , sensitivity of 7 uV/mm, display speed of 37mm/sec with a 60Hz  notched filter applied as appropriate. EEG data were recorded continuously and digitally stored.  Video monitoring was available and reviewed as appropriate. Description:  EEG showed  continuous generalized 3 to 6 Hz theta-delta slowing. Hyperventilation and photic stimulation were not performed.   ABNORMALITY - Continuous slow, generalized IMPRESSION: This study is suggestive of generalized cerebral dysfunction (encephalopathy). No seizures or epileptiform discharges were seen throughout the recording. Arlin KIDD Shelton   CT HEAD CODE STROKE WO CONTRAST Result Date: 01/28/2024 EXAM: CT HEAD WITHOUT CONTRAST 01/28/2024 01:56:04 PM TECHNIQUE: CT of the head was performed without the administration of intravenous contrast. Automated exposure control, iterative reconstruction, and/or weight based adjustment of the mA/kV was utilized to reduce the radiation dose to as low as reasonably achievable. COMPARISON: CT head 01/16/2024 and CTA head and neck 01/19/2024. CLINICAL HISTORY: Neuro deficit, acute, stroke suspected.  FINDINGS: BRAIN AND VENTRICLES: No acute hemorrhage. No evidence of acute infarct. Nonspecific hypoattenuation in the periventricular and subcortical white matter, most likely representing chronic microvascular ischemic changes. Remote lacunar infarcts in the bilateral thalami and bilateral basal ganglia. Atherosclerosis of the carotid siphons and intracranial vertebral arteries. No hydrocephalus. No extra-axial collection. No mass effect or midline shift. ORBITS: Bilateral lens replacement. No acute abnormality. SINUSES: No acute abnormality. SOFT TISSUES AND SKULL: No acute soft tissue abnormality. No skull fracture. [place] stroke program early CT (aspect) score: Ganglionic (caudate, ic, lentiform nucleus, insula, M1-m3): 7 Supraganglionic (m4-m6): 3 Total: 10 IMPRESSION: 1. No acute intracranial abnormality. 2. Chronic microvascular ischemic changes. 3. Remote lacunar infarcts in the bilateral thalami and basal ganglia. 4. Findings messaged to Dr. Sallyann at 2:02PM on 01/28/24. Electronically signed by: Donnice Mania MD 01/28/2024 02:02 PM EDT RP Workstation: [id number]   CT ANGIO  HEAD NECK W WO CM Result Date: 01/19/2024 CLINICAL DATA:  Neuro deficit, acute stroke suspected EXAM: CT ANGIOGRAPHY HEAD AND NECK WITH AND WITHOUT CONTRAST TECHNIQUE: Multidetector CT imaging of the head and neck was performed using the standard protocol during bolus administration of intravenous contrast. Multiplanar CT image reconstructions and MIPs were obtained to evaluate the vascular anatomy. Carotid stenosis measurements (when applicable) are obtained utilizing NASCET criteria, using the distal internal carotid diameter as the denominator. RADIATION DOSE REDUCTION: This exam was performed according to the departmental dose-optimization program which includes automated exposure control, adjustment of the mA and/or kV according to patient size and/or use of iterative reconstruction technique. CONTRAST:  75mL OMNIPAQUE  IOHEXOL  350 MG/ML SOLN COMPARISON:  CT head 01/16/2024, MRI 01/18/2024 FINDINGS: CT HEAD: There is low-attenuation in the cerebral white matter. There are old lacunar infarcts in both sides of the thalamus. There is no hemorrhage. No acute ischemic changes. No mass lesion. The ventricles are normal. Skull/sinuses/orbits: No significant abnormality. CTA NECK: CTA NECK Aortic arch: No proximal vessel stenosis. Right carotid: Normal Left carotid: Small amount of calcified plaque at the bifurcation with no stenosis Right vertebral: Normal Left vertebral: There is some calcification at the origin. Otherwise normal Soft tissues: No significant abnormality Other comments: None CTA HEAD: CTA HEAD Right anterior circulation: The internal carotid artery is patent without significant stenosis. The anterior and middle cerebral arteries are patent without significant stenosis or proximal branch occlusion. No aneurysm. Left anterior circulation: The internal carotid artery is patent without significant stenosis. The anterior and middle cerebral arteries are patent without significant stenosis or proximal  branch occlusion. No aneurysm. Posterior circulation: Both vertebral arteries are patent. There is no significant basilar stenosis. Both posterior cerebral arteries are patent. There is a severe stenosis of the right posterior cerebral artery. IMPRESSION: 1. Old bilateral thalamic lacunar infarcts and chronic white matter disease 2. No carotid stenosis 3. No basilar stenosis 4. Severe right posterior cerebral artery stenosis Electronically Signed   By: Nancyann Burns M.D.   On: 01/19/2024 11:38   MR Cervical Spine W and Wo Contrast Result Date: 01/18/2024 EXAM: MRI CERVICAL SPINE WITH AND WITHOUT CONTRAST 01/18/2024 12:44:00 PM TECHNIQUE: Multiplanar multisequence MRI of the cervical spine was performed without and with the administration of 8 mL gadobutrol  (GADAVIST ) 1 MMOL/ML injection. COMPARISON: Cervical spine CT 05/09/2023. 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 FINDINGS: BONES AND ALIGNMENT: Improved cervical lordosis compared to 05/09/2023. Normal vertebral body heights. Normal background bone marrow signal. No marrow edema. No abnormal enhancement. Age appropriate cervical spine degeneration. No degenerative cervical spinal stenosis.  SPINAL CORD: No convincing spinal cord signal abnormality. No abnormal intradural enhancement. No dural thickening. SOFT TISSUES: No paraspinal mass. Trace retained secretions in the visible pharynx. Preserved major vascular flow voids in the neck. No significant spinal canal stenosis. IMPRESSION: 1. Age-appropriate cervical spine degeneration without significant spinal stenosis. 2. Visible spinal cord within normal limits. 3. See Brain MRI reported separately today. Electronically signed by: Helayne Hurst MD 01/18/2024 01:38 PM EDT RP Workstation: [id number]   MR Brain Wo Contrast (neuro protocol) Result Date: 01/18/2024 EXAM: MR Brain without Intravenous Contrast. CLINICAL HISTORY: Patient presents with acute neuro deficit, stroke  suspected. TECHNIQUE: Magnetic resonance images of the brain without intravenous contrast in multiple planes. CONTRAST: Without. COMPARISON: Head CT 01/16/2024, Brain MRI 05/08/2023. FINDINGS: BRAIN: 7 mm focus of restricted diffusion in the right pons (series 2 image 15), indicating acute infarction. Punctate petechial hemorrhage is present, with pontine hemosiderin increased on series 7 image 28 since January, but no malignant hemorrhagic transformation. No mass effect. No other diffusion restriction. Advanced chronic small vessel disease with stable chronic lacunar infarcts in the bilateral cerebral white matter, right lentiform nucleus, and bilateral thalami. Occasional chronic microhemorrhages. No midline shift or extra-axial fluid collection. No cerebellar tonsillar ectopia. The central arterial and venous flow voids are patent. VENTRICLES: No hydrocephalus. ORBITS: The orbits are normal. SINUSES AND MASTOIDS: The sinuses and mastoid air cells are clear. BONES: No acute fracture or focal osseous lesion. IMPRESSION: 1. Acute right pontine infarct (7 mm) with possible punctate petechial hemorrhage, but No malignant hemorrhagic transformation and no mass effect. 2. No additional acute findings. Underlying Advanced chronic small vessel disease. Electronically signed by: Helayne Hurst MD 01/18/2024 01:34 PM EDT RP Workstation: [id number]   CT Head Wo Contrast Result Date: 01/16/2024 CLINICAL DATA:  Neuro deficit, acute, stroke suspected Worsening left-sided weakness EXAM: CT HEAD WITHOUT CONTRAST TECHNIQUE: Contiguous axial images were obtained from the base of the skull through the vertex without intravenous contrast. RADIATION DOSE REDUCTION: This exam was performed according to the departmental dose-optimization program which includes automated exposure control, adjustment of the mA and/or kV according to patient size and/or use of iterative reconstruction technique. COMPARISON:  CT head 12/19/2023, CT head  12/13/2023 FINDINGS: Brain: Patchy and confluent areas of decreased attenuation are noted throughout the deep and periventricular white matter of the cerebral hemispheres bilaterally, compatible with chronic microvascular ischemic disease. Redemonstration of bilateral chronic basal ganglia lacunar infarctions. No evidence of large-territorial acute infarction. No parenchymal hemorrhage. No mass lesion. No extra-axial collection. No mass effect or midline shift. No hydrocephalus. Basilar cisterns are patent. Vascular: No hyperdense vessel. Atherosclerotic calcifications are present within the cavernous internal carotid and vertebral arteries. Skull: No acute fracture or focal lesion. Sinuses/Orbits: Paranasal sinuses and mastoid air cells are clear. Bilateral lens replacement. Otherwise the orbits are unremarkable. Other: None. IMPRESSION: No acute intracranial abnormality. Electronically Signed   By: Morgane  Naveau M.D.   On: 01/16/2024 18:36   DG Chest Port 1 View Result Date: 01/16/2024 CLINICAL DATA:  Weakness EXAM: PORTABLE CHEST - 1 VIEW COMPARISON:  12/13/2023 FINDINGS: Low lung volumes. No focal airspace consolidation, pleural effusion, or pneumothorax. No cardiomegaly. Left chest pacemaker with leads terminating in the right atrium and right ventricle. Aortic atherosclerosis. No acute fracture or destructive lesions. Multilevel thoracic osteophytosis. IMPRESSION: Low lung volumes.  Otherwise, no acute cardiopulmonary abnormality. Electronically Signed   By: Rogelia Myers M.D.   On: 01/16/2024 16:15     The results of significant diagnostics from this hospitalization (  including imaging, microbiology, ancillary and laboratory) are listed below for reference.     Microbiology: Recent Results (from the past 240 hours)  Culture, blood (Routine X 2) w Reflex to ID Panel     Status: None   Collection Time: 01/28/24  6:13 PM   Specimen: BLOOD RIGHT HAND  Result Value Ref Range Status   Specimen  Description BLOOD RIGHT HAND  Final   Special Requests   Final    BOTTLES DRAWN AEROBIC ONLY Blood Culture results may not be optimal due to an inadequate volume of blood received in culture bottles   Culture   Final    NO GROWTH 5 DAYS Performed at [facility], [address], [place], [place] [address]    Report Status 02/02/2024 FINAL  Final  Culture, blood (Routine X 2) w Reflex to ID Panel     Status: None   Collection Time: 01/28/24  6:13 PM   Specimen: BLOOD LEFT HAND  Result Value Ref Range Status   Specimen Description BLOOD LEFT HAND  Final   Special Requests   Final    BOTTLES DRAWN AEROBIC ONLY Blood Culture results may not be optimal due to an inadequate volume of blood received in culture bottles   Culture   Final    NO GROWTH 5 DAYS Performed at [facility], [address], [place], [place] [address]    Report Status 02/02/2024 FINAL  Final  Urine Culture (for pregnant, neutropenic or urologic patients or patients with an indwelling urinary catheter)     Status: Abnormal   Collection Time: 01/28/24  6:45 PM   Specimen: Urine, Catheterized  Result Value Ref Range Status   Specimen Description URINE, CATHETERIZED  Final   Special Requests   Final    NONE Performed at [facility], [address], [place], [place] [address]    Culture 60,000 COLONIES/mL YEAST (A)  Final   Report Status 01/30/2024 FINAL  Final  Remove and replace urinary cath (placed > 5 days) then obtain urine culture from new indwelling urinary catheter.     Status: Abnormal   Collection Time: 01/28/24  8:42 PM   Specimen: Urine, Catheterized  Result Value Ref Range Status   Specimen Description URINE, CATHETERIZED  Final   Special Requests   Final    NONE Performed at [facility], [address], [place], [place] [address]    Culture >=100,000 COLONIES/mL YEAST (A)  Final   Report Status 01/29/2024 FINAL  Final  MRSA Next Gen by PCR, Nasal     Status: None    Collection Time: 01/28/24  9:19 PM   Specimen: Nasal Mucosa; Nasal Swab  Result Value Ref Range Status   MRSA by PCR Next Gen NOT DETECTED NOT DETECTED Final    Comment: (NOTE) The GeneXpert MRSA Assay (FDA approved for NASAL specimens only), is one component of a comprehensive MRSA colonization surveillance program. It is not intended to diagnose MRSA infection nor to guide or monitor treatment for MRSA infections. Test performance is not FDA approved in patients less than 69 years old. Performed at [facility], [address] [place], [place] [address]   Culture, blood (x 2)     Status: None   Collection Time: 01/28/24 11:29 PM   Specimen: BLOOD RIGHT HAND  Result Value Ref Range Status   Specimen Description BLOOD RIGHT HAND  Final   Special Requests   Final  BOTTLES DRAWN AEROBIC ONLY Blood Culture results may not be optimal due to an inadequate volume of blood received in culture bottles   Culture   Final    NO GROWTH 5 DAYS Performed at [facility], [address] [place], [place] [address]    Report Status 02/02/2024 FINAL  Final  Culture, blood (x 2)     Status: None   Collection Time: 01/28/24 11:29 PM   Specimen: BLOOD RIGHT ARM  Result Value Ref Range Status   Specimen Description BLOOD RIGHT ARM  Final   Special Requests   Final    BOTTLES DRAWN AEROBIC ONLY Blood Culture results may not be optimal due to an inadequate volume of blood received in culture bottles   Culture   Final    NO GROWTH 5 DAYS Performed at [facility], [address] [place], [place] [address]    Report Status 02/02/2024 FINAL  Final     Labs: BNP (last 3 results) Recent Labs    08/06/23 1713  BNP 307.2*   Basic Metabolic Panel: Recent Labs  Lab 01/28/24 1411 01/29/24 0256 01/30/24 0250 01/31/24 0759 02/01/24 0232  NA 137 137 137 137 136  K 4.1 3.9 3.2* 3.8 3.7  CL 102 103 105 105 104  CO2 15* 23 24 25 23   GLUCOSE 154* 115* 236* 156* 272*  BUN 32*  26* 19 11 12   CREATININE 1.19 0.91 0.76 0.67 0.78  CALCIUM  9.7 9.2 8.9 9.2 8.7*  MG  --  1.4* 2.0 1.5* 1.6*  PHOS  --   --   --  2.2* 2.4*   Liver Function Tests: Recent Labs  Lab 01/28/24 2329 01/30/24 0250 01/31/24 0759 02/01/24 0232  AST 26 26  --   --   ALT 33 28  --   --   ALKPHOS 92 75  --   --   BILITOT 1.5* 1.3*  --   --   PROT 5.7* 4.7*  --   --   ALBUMIN 3.0* 2.4* 2.6* 2.4*   No results for input(s): LIPASE, AMYLASE in the last 168 hours. Recent Labs  Lab 01/29/24 0256  AMMONIA 37*   CBC: Recent Labs  Lab 01/28/24 1411 01/29/24 0256 01/30/24 0250 01/31/24 0759 02/01/24 0232  WBC 12.4* 11.2* 6.0 7.1 6.9  HGB 12.6* 11.3* 10.8* 11.6* 11.0*  HCT 39.1 33.8* 32.1* 34.5* 33.0*  MCV 90.5 89.2 89.2 87.8 88.2  PLT 304 254 228 253 267   Cardiac Enzymes: Recent Labs  Lab 01/28/24 1411  CKTOTAL 44*  CKMB 3.1   BNP: Invalid input(s): POCBNP CBG: Recent Labs  Lab 02/02/24 1128 02/02/24 1610 02/02/24 2057 02/03/24 0609 02/03/24 1049  GLUCAP 237* 173* 206* 138* 237*   D-Dimer No results for input(s): DDIMER in the last 72 hours. Hgb A1c No results for input(s): HGBA1C in the last 72 hours. Lipid Profile No results for input(s): CHOL, HDL, LDLCALC, TRIG, CHOLHDL, LDLDIRECT in the last 72 hours. Thyroid  function studies No results for input(s): TSH, T4TOTAL, T3FREE, THYROIDAB in the last 72 hours.  Invalid input(s): FREET3 Anemia work up No results for input(s): VITAMINB12, FOLATE, FERRITIN, TIBC, IRON, RETICCTPCT in the last 72 hours. Urinalysis    Component Value Date/Time   COLORURINE YELLOW 01/28/2024 1845   APPEARANCEUR CLOUDY (A) 01/28/2024 1845   LABSPEC 1.020 01/28/2024 1845   PHURINE 5.0 01/28/2024 1845   GLUCOSEU NEGATIVE 01/28/2024 1845   HGBUR NEGATIVE 01/28/2024 1845   BILIRUBINUR NEGATIVE 01/28/2024  1845   KETONESUR NEGATIVE 01/28/2024 1845   PROTEINUR 30 (A) 01/28/2024 1845   NITRITE  NEGATIVE 01/28/2024 1845   LEUKOCYTESUR MODERATE (A) 01/28/2024 1845   Sepsis Labs Recent Labs  Lab 01/29/24 0256 01/30/24 0250 01/31/24 0759 02/01/24 0232  WBC 11.2* 6.0 7.1 6.9   Microbiology Recent Results (from the past 240 hours)  Culture, blood (Routine X 2) w Reflex to ID Panel     Status: None   Collection Time: 01/28/24  6:13 PM   Specimen: BLOOD RIGHT HAND  Result Value Ref Range Status   Specimen Description BLOOD RIGHT HAND  Final   Special Requests   Final    BOTTLES DRAWN AEROBIC ONLY Blood Culture results may not be optimal due to an inadequate volume of blood received in culture bottles   Culture   Final    NO GROWTH 5 DAYS Performed at [facility], [address], [place], [place] [address]    Report Status 02/02/2024 FINAL  Final  Culture, blood (Routine X 2) w Reflex to ID Panel     Status: None   Collection Time: 01/28/24  6:13 PM   Specimen: BLOOD LEFT HAND  Result Value Ref Range Status   Specimen Description BLOOD LEFT HAND  Final   Special Requests   Final    BOTTLES DRAWN AEROBIC ONLY Blood Culture results may not be optimal due to an inadequate volume of blood received in culture bottles   Culture   Final    NO GROWTH 5 DAYS Performed at [facility], [address] [place], [place] [address]    Report Status 02/02/2024 FINAL  Final  Urine Culture (for pregnant, neutropenic or urologic patients or patients with an indwelling urinary catheter)     Status: Abnormal   Collection Time: 01/28/24  6:45 PM   Specimen: Urine, Catheterized  Result Value Ref Range Status   Specimen Description URINE, CATHETERIZED  Final   Special Requests   Final    NONE Performed at [facility], [address] [place], [place] [address]    Culture 60,000 COLONIES/mL YEAST (A)  Final   Report Status 01/30/2024 FINAL  Final  Remove and replace urinary cath (placed > 5 days) then obtain urine culture from new indwelling urinary catheter.     Status:  Abnormal   Collection Time: 01/28/24  8:42 PM   Specimen: Urine, Catheterized  Result Value Ref Range Status   Specimen Description URINE, CATHETERIZED  Final   Special Requests   Final    NONE Performed at [facility], [address] [place], [place] [address]    Culture >=100,000 COLONIES/mL YEAST (A)  Final   Report Status 01/29/2024 FINAL  Final  MRSA Next Gen by PCR, Nasal     Status: None   Collection Time: 01/28/24  9:19 PM   Specimen: Nasal Mucosa; Nasal Swab  Result Value Ref Range Status   MRSA by PCR Next Gen NOT DETECTED NOT DETECTED Final    Comment: (NOTE) The GeneXpert MRSA Assay (FDA approved for NASAL specimens only), is one component of a comprehensive MRSA colonization surveillance program. It is not intended to diagnose MRSA infection nor to guide or monitor treatment for MRSA infections. Test performance is not FDA approved in patients less than 25 years old. Performed at [facility], [address] [place], [place] [address]   Culture, blood (x 2)     Status: None  Collection Time: 01/28/24 11:29 PM   Specimen: BLOOD RIGHT HAND  Result Value Ref Range Status   Specimen Description BLOOD RIGHT HAND  Final   Special Requests   Final    BOTTLES DRAWN AEROBIC ONLY Blood Culture results may not be optimal due to an inadequate volume of blood received in culture bottles   Culture   Final    NO GROWTH 5 DAYS Performed at [facility], [address] [place], [place] [address]    Report Status 02/02/2024 FINAL  Final  Culture, blood (x 2)     Status: None   Collection Time: 01/28/24 11:29 PM   Specimen: BLOOD RIGHT ARM  Result Value Ref Range Status   Specimen Description BLOOD RIGHT ARM  Final   Special Requests   Final    BOTTLES DRAWN AEROBIC ONLY Blood Culture results may not be optimal due to an inadequate volume of blood received in culture bottles   Culture   Final    NO GROWTH 5 DAYS Performed at [facility], [address] [place], [place] [address]    Report Status 02/02/2024 FINAL  Final     Time coordinating discharge:  I have spent 35 minutes face to face with the patient and on the ward discussing the patients care, assessment, plan and disposition with other care givers. >50% of the time was devoted counseling the patient about the risks and benefits of treatment/Discharge disposition and coordinating care.   SIGNED:   Burgess JAYSON Dare, MD  [facility] 02/03/2024, 2:34 PM   If 7PM-7AM, please contact night-coverage

## 2024-02-03 NOTE — Plan of Care (Signed)

## 2024-02-03 NOTE — Inpatient Diabetes Management (Signed)
 Inpatient Diabetes Program Recommendations  AACE/ADA: New Consensus Statement on Inpatient Glycemic Control   Target Ranges:  Prepandial:   less than 140 mg/dL      Peak postprandial:   less than 180 mg/dL (1-2 hours)      Critically ill patients:  140 - 180 mg/dL   Lab Results  Component Value Date   GLUCAP 138 (H) 02/03/2024   HGBA1C 8.5 (H) 01/17/2024    Latest Reference Range & Units 02/02/24 06:00 02/02/24 11:28 02/02/24 16:10 02/02/24 20:57 02/03/24 06:09  Glucose-Capillary 70 - 99 mg/dL 834 (H) 762 (H) 826 (H) 206 (H) 138 (H)   Review of Glycemic Control  Diabetes history: DM2  Outpatient Diabetes medications:  Lantus  15 units daily  Novolog  0-9 units TID  Novolog  3 units TID with meals   Current orders for Inpatient glycemic control:  Semglee  15 units daily  Novolog  0-9 units TID + 0-5 units at bedtime   Inpatient Diabetes Program Recommendations:   Please consider starting Novolog  2 units TID with meals (if patient consumes atleast 50% of meal)   Thanks,  Lavanda Search, RN, MSN, [facility]  Inpatient Diabetes Coordinator  Pager [phone] (8a-5p)

## 2024-02-04 DIAGNOSIS — I639 Cerebral infarction, unspecified: Secondary | ICD-10-CM | POA: Diagnosis not present

## 2024-02-04 LAB — CBC WITH DIFFERENTIAL/PLATELET
Abs Immature Granulocytes: 0.06 K/uL (ref 0.00–0.07)
Basophils Absolute: 0.1 K/uL (ref 0.0–0.1)
Basophils Relative: 1 %
Eosinophils Absolute: 0.2 K/uL (ref 0.0–0.5)
Eosinophils Relative: 2 %
HCT: 31.2 % — ABNORMAL LOW (ref 39.0–52.0)
Hemoglobin: 10.6 g/dL — ABNORMAL LOW (ref 13.0–17.0)
Immature Granulocytes: 1 %
Lymphocytes Relative: 38 %
Lymphs Abs: 3 K/uL (ref 0.7–4.0)
MCH: 30 pg (ref 26.0–34.0)
MCHC: 34 g/dL (ref 30.0–36.0)
MCV: 88.4 fL (ref 80.0–100.0)
Monocytes Absolute: 0.5 K/uL (ref 0.1–1.0)
Monocytes Relative: 7 %
Neutro Abs: 4 K/uL (ref 1.7–7.7)
Neutrophils Relative %: 51 %
Platelets: 300 K/uL (ref 150–400)
RBC: 3.53 MIL/uL — ABNORMAL LOW (ref 4.22–5.81)
RDW: 17.5 % — ABNORMAL HIGH (ref 11.5–15.5)
WBC: 7.7 K/uL (ref 4.0–10.5)
nRBC: 0 % (ref 0.0–0.2)

## 2024-02-04 LAB — COMPREHENSIVE METABOLIC PANEL WITH GFR
ALT: 19 U/L (ref 0–44)
AST: 14 U/L — ABNORMAL LOW (ref 15–41)
Albumin: 2.4 g/dL — ABNORMAL LOW (ref 3.5–5.0)
Alkaline Phosphatase: 71 U/L (ref 38–126)
Anion gap: 10 (ref 5–15)
BUN: 19 mg/dL (ref 8–23)
CO2: 25 mmol/L (ref 22–32)
Calcium: 8.9 mg/dL (ref 8.9–10.3)
Chloride: 102 mmol/L (ref 98–111)
Creatinine, Ser: 0.82 mg/dL (ref 0.61–1.24)
GFR, Estimated: >60 mL/min (ref >60)
Glucose, Bld: 163 mg/dL — ABNORMAL HIGH (ref 70–99)
Potassium: 3.6 mmol/L (ref 3.5–5.1)
Sodium: 137 mmol/L (ref 135–145)
Total Bilirubin: 1.2 mg/dL (ref 0.0–1.2)
Total Protein: 4.7 g/dL — ABNORMAL LOW (ref 6.5–8.1)

## 2024-02-04 LAB — GLUCOSE, CAPILLARY
Glucose-Capillary: 140 mg/dL — ABNORMAL HIGH (ref 70–99)
Glucose-Capillary: 297 mg/dL — ABNORMAL HIGH (ref 70–99)
Glucose-Capillary: 242 mg/dL — ABNORMAL HIGH (ref 70–99)
Glucose-Capillary: 196 mg/dL — ABNORMAL HIGH (ref 70–99)

## 2024-02-04 NOTE — Plan of Care (Signed)
  Problem: RH Balance Goal: LTG Patient will maintain dynamic sitting balance (PT) Description: LTG:  Patient will maintain dynamic sitting balance with assistance during mobility activities (PT) Flowsheets (Taken 02/04/2024 1549) LTG: Pt will maintain dynamic sitting balance during mobility activities with:: Independent with assistive device  Goal: LTG Patient will maintain dynamic standing balance (PT) Description: LTG:  Patient will maintain dynamic standing balance with assistance during mobility activities (PT) Flowsheets (Taken 02/04/2024 1549) LTG: Pt will maintain dynamic standing balance during mobility activities with:: Supervision/Verbal cueing   Problem: Sit to Stand Goal: LTG:  Patient will perform sit to stand with assistance level (PT) Description: LTG:  Patient will perform sit to stand with assistance level (PT) Flowsheets (Taken 02/04/2024 1549) LTG: PT will perform sit to stand in preparation for functional mobility with assistance level: Supervision/Verbal cueing   Problem: RH Bed Mobility Goal: LTG Patient will perform bed mobility with assist (PT) Description: LTG: Patient will perform bed mobility with assistance, with/without cues (PT). Flowsheets (Taken 02/04/2024 1549) LTG: Pt will perform bed mobility with assistance level of: Supervision/Verbal cueing   Problem: RH Bed to Chair Transfers Goal: LTG Patient will perform bed/chair transfers w/assist (PT) Description: LTG: Patient will perform bed to chair transfers with assistance (PT). Flowsheets (Taken 02/04/2024 1549) LTG: Pt will perform Bed to Chair Transfers with assistance level: Supervision/Verbal cueing   Problem: RH Car Transfers Goal: LTG Patient will perform car transfers with assist (PT) Description: LTG: Patient will perform car transfers with assistance (PT). Flowsheets (Taken 02/04/2024 1549) LTG: Pt will perform car transfers with assist:: Supervision/Verbal cueing   Problem: RH  Ambulation Goal: LTG Patient will ambulate in controlled environment (PT) Description: LTG: Patient will ambulate in a controlled environment, # of feet with assistance (PT). Flowsheets (Taken 02/04/2024 1549) LTG: Pt will ambulate in controlled environ  assist needed:: Supervision/Verbal cueing LTG: Ambulation distance in controlled environment: 150' Goal: LTG Patient will ambulate in home environment (PT) Description: LTG: Patient will ambulate in home environment, # of feet with assistance (PT). Flowsheets (Taken 02/04/2024 1549) LTG: Pt will ambulate in home environ  assist needed:: Supervision/Verbal cueing LTG: Ambulation distance in home environment: 50'   Problem: RH Stairs Goal: LTG Patient will ambulate up and down stairs w/assist (PT) Description: LTG: Patient will ambulate up and down # of stairs with assistance (PT) Flowsheets (Taken 02/04/2024 1549) LTG: Pt will ambulate up/down stairs assist needed:: Supervision/Verbal cueing LTG: Pt will  ambulate up and down number of stairs: 3 per home set up

## 2024-02-04 NOTE — Evaluation (Signed)
 Speech Language Pathology Assessment and Plan  Patient Details  Name: John [name] MRN: [id number] Date of Birth: 1946-01-26  SLP Diagnosis: Cognitive Impairments  Rehab Potential: Good ELOS: 2-3 weeks   Today's Date: 02/04/2024 SLP Individual Time: 1003-1059 SLP Individual Time Calculation (min): 56 min  Hospital Problem: Principal Problem:   CVA (cerebral vascular accident) [facility])  Past Medical History:  Past Medical History:  Diagnosis Date   BPH (benign prostatic hyperplasia)    CTS (carpal tunnel syndrome)    Depression    Diabetes mellitus    Essential hypertension    GERD (gastroesophageal reflux disease)    History of breast cancer    Hypercholesteremia    Neuropathy    Stroke [facility])    Past Surgical History:  Past Surgical History:  Procedure Laterality Date   CATARACT EXTRACTION, BILATERAL  01/18/2017   CORONARY STENT INTERVENTION N/A 08/10/2023   Procedure: CORONARY STENT INTERVENTION;  Surgeon: Darron Deatrice LABOR, MD;  Location: [facility] INVASIVE CV LAB;  Service: Cardiovascular;  Laterality: N/A;   LEFT HEART CATH AND CORONARY ANGIOGRAPHY N/A 08/10/2023   Procedure: LEFT HEART CATH AND CORONARY ANGIOGRAPHY;  Surgeon: Darron Deatrice LABOR, MD;  Location: [facility] INVASIVE CV LAB;  Service: Cardiovascular;  Laterality: N/A;   LITHOTRIPSY     MASTECTOMY Right 05/02/2018   PACEMAKER IMPLANT N/A 05/13/2023   Procedure: PACEMAKER IMPLANT;  Surgeon: Kennyth Chew, MD;  Location: [facility] INVASIVE CV LAB;  Service: Cardiovascular;  Laterality: N/A;   TEMPORARY PACEMAKER N/A 05/11/2023   Procedure: TEMPORARY PACEMAKER;  Surgeon: Jordan, Peter M, MD;  Location: [facility] INVASIVE CV LAB;  Service: Cardiovascular;  Laterality: N/A;   TONSILLECTOMY AND ADENOIDECTOMY     TRANSESOPHAGEAL ECHOCARDIOGRAM (CATH LAB) N/A 12/08/2023   Procedure: TRANSESOPHAGEAL ECHOCARDIOGRAM;  Surgeon: Raford Riggs, MD;  Location: [facility] INVASIVE CV LAB;  Service: Cardiovascular;  Laterality: N/A;    Assessment /  Plan / Recommendation Clinical Impression  John [name] is a 78 year old male with PMHx of CHF with reduced EF, CVA, AVB status post pacemaker, DM2, HTN, HLD, DVT not on anticoagulation, 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 Patient presented to [facility] on 01/16/2024, with complaints of left-sided weakness. An initial CT scan of the head showed no acute abnormalities, and a chest X-ray was negative. Urinalysis was abnormal with a urine culture positive for ESBL E. Coli, and the patient received IV meropenem that was to transition to fosfomycin. Initial lab results were significant for a glucose level of 164 mg/dL, procalcitonin of 534 ng/mL, WBC of 8.5 x 10^9/L, and hemoglobin of 10 g/dL. An EKG showed junctional rhythm with right bundle branch block. Neurology was consulted for further evaluation. The patient was transferred to [facility] on 01/18/2024. An MRI was positive for a right pontine infarct with a possible punctate petechial hemorrhage. A CTA of the head and neck showed severe right posterior cerebral artery stenosis. Neurology felt this was a lacunar infarct and recommended to continue aspirin  and Plavix  for 3 weeks then aspirin  alone.Patient continues with Foley catheter.    The patient was admitted to CIR on 01/24/2024 where he became hypotensive and unresponsive requiring transfer back to acute medicine services.  He was found to be hypotensive with a systolic BP in the 50s.  Code stroke was called and patient was evaluated by neurology who suspected encephalopathy rather than CVA.  Workup: CT head with no acute findings, EEG suggestive of encephalopathy, labs  with metabolic acidosis, increased BUN to creatinine ratio, no leukocytosis, lactic acid 3.6, pyuria and bacteria.  CT showed concerns of right lower lobe bronchopneumonia likely from aspiration, T12 compression fracture and early  cirrhosis. Blood cultures are negative, antibiotics have been transitioned to Augmentin , last day 10/24. Patient has refused speech and swallow evaluation. Seen by palliative care service and decision is for patient to return back to CIR.    Prior to hospitalization the patient required assistance with ADLs and had impaired functional cognition.  Currently requiring max assist for functional mobility and ADLs and mod assist to power up and balance for transfers. Therapy evaluations completed due to patient decreased functional mobility was admitted for a comprehensive rehab program. Pt was admitted to CIR on 02/03/24.  Cognitive/ Linguistic: Pt presents with moderate cognitive linguistic deficits. Portions of COGNISTAT administered with pt demonstrating moderate impairments in orientation, memory and executive function. Per COGNISTAT, strengths were in attention and calculations. Informally, pt presents with deficits in information processing, attention, orientation, problem solving and recall. He demonstrated intellectual awareness of cognitive deficits reporting that cognition has been impacted and has not returned to baseline. PTA pt's wife managed all IADLs. Though, pt did participate fully in assessment, SLP noted pt to utilize sarcasm and deflection with tasks of greater complexity. SLP recommending skilled intervention to address memory, attention, awareness, and problem solving. Conversational speech observed to be 100% fluent and intelligible with no concerns for dysarthria noted. Expressive and receptive language functions appeared present at times but suspect due to cognitive changes vs true language impairment.  Swallowing: Pt declined solid trials during evaluation but intermittently consuming sips of thin liquid. At baseline, pt noted with intermittent productive cough though did not appear consistent with liquid boluses. Pt repeatedly and adamantly denied swallowing difficulty. SLP recommending  continuation of regular diet and thin liquids with intermittent supervision. Pt would also benefit from further follow up on swallow function due to R lower lobe PNA.   Pt would benefit from skilled SLP services to maximize dysphagia and cognition in order to maximize his independence prior to discharge. Anticipate pt will require 24 hour supervision at home and f/u home health SLP services.     Skilled Therapeutic Interventions          BSE, informal assessment measures, and COGNISTAT administered. Please see full report for additional details.     SLP Assessment  Patient will need skilled Speech Lanaguage Pathology Services during CIR admission    Recommendations  Medication Administration: Whole meds with liquid Supervision: Patient able to self feed;Full supervision/cueing for compensatory strategies Compensations: Slow rate;Small sips/bites Postural Changes and/or Swallow Maneuvers: Seated upright 90 degrees;Upright 30-60 min after meal Oral Care Recommendations: Oral care BID Patient destination: Home Follow up Recommendations: 24 hour supervision/assistance;Home Health SLP;Outpatient SLP Equipment Recommended: None recommended by SLP    SLP Frequency 3 to 5 out of 7 days   SLP Duration  SLP Intensity  SLP Treatment/Interventions 2-3 weeks  Minumum of 1-2 x/day, 30 to 90 minutes  Cognitive remediation/compensation;Environmental Economist;Therapeutic Activities;Functional tasks;Internal/external aids;Therapeutic Exercise;Patient/family education    Pain Pain Assessment Pain Scale: 0-10 Pain Score: 0-No pain  Prior Functioning Cognitive/Linguistic Baseline: Baseline deficits Baseline deficit details: cognitive lingustic changes from previous CVA Type of Home: House  Lives With: Spouse Available Help at Discharge: Family Vocation: Retired  SLP Evaluation Cognition Overall Cognitive Status: Impaired/Different from baseline Arousal/Alertness:  Awake/alert Orientation Level: Oriented to person;Oriented to time;Disoriented to time;Disoriented to situation Year: 2025 Month: October Day  of Week: Correct Attention: Sustained Sustained Attention: Impaired Sustained Attention Impairment: Verbal basic;Functional basic Memory: Impaired Memory Impairment: Decreased short term memory;Storage deficit;Decreased recall of new information Decreased Short Term Memory: Verbal basic;Functional basic Awareness: Impaired Awareness Impairment: Intellectual impairment Problem Solving: Impaired Problem Solving Impairment: Verbal basic;Functional basic Executive Function: Initiating;Self Monitoring Initiating: Impaired Initiating Impairment: Functional basic;Verbal basic Self Monitoring: Impaired Self Monitoring Impairment: Functional basic;Verbal basic Safety/Judgment: Impaired Comments: Patient appeared to utilize sarcasm as a deflecting mechanism  Comprehension Auditory Comprehension Overall Auditory Comprehension: Appears within functional limits for tasks assessed Conversation: Simple Interfering Components: Attention;Hearing;Working memory EffectiveTechniques: Increased volume;Slowed speech Expression Expression Primary Mode of Expression: Verbal Verbal Expression Overall Verbal Expression: Appears within functional limits for tasks assessed Level of Generative/Spontaneous Verbalization: Music Therapist Expression Dominant Hand: Right Oral Motor Oral Motor/Sensory Function Overall Oral Motor/Sensory Function: Within functional limits Motor Speech Overall Motor Speech: Appears within functional limits for tasks assessed  Care Tool Care Tool Cognition Ability to hear (with hearing aid or hearing appliances if normally used Ability to hear (with hearing aid or hearing appliances if normally used): 2. Moderate difficulty - speaker has to increase volume and speak distinctly   Expression of Ideas and Wants Expression of Ideas  and Wants: 3. Some difficulty - exhibits some difficulty with expressing needs and ideas (e.g, some words or finishing thoughts) or speech is not clear   Understanding Verbal and Non-Verbal Content Understanding Verbal and Non-Verbal Content: 2. Sometimes understands - understands only basic conversations or simple, direct phrases. Frequently requires cues to understand  Memory/Recall Ability Memory/Recall Ability : Current season   Bedside Swallowing Assessment General Diet Prior to this Study: Regular;Thin liquids (Level 0) Temperature Spikes Noted: No Respiratory Status: Room air History of Recent Intubation: No Behavior/Cognition: Alert;Requires cueing;Fusing/Irritable Self-Feeding Abilities: Able to feed self Vision: Functional for self-feeding Patient Positioning: Upright in bed Volitional Cough: Strong  Oral Care Assessment Oral Assessment  (WDL): Within Defined Limits Lips: Symmetrical Teeth: Missing (Comment) Tongue: Pink;Moist Mucous Membrane(s): Pink;Moist Saliva: Moist, saliva free flowing Level of Consciousness: Alert Is patient on any of following O2 devices?: None of the above Nutritional status: No high risk factors Oral Assessment Risk : Low Risk Ice Chips Ice chips: Not tested Thin Liquid Thin Liquid: Within functional limits Nectar Thick Nectar Thick Liquid: Not tested Honey Thick Honey Thick Liquid: Not tested Puree Puree: Not tested Solid Solid: Not tested BSE Assessment Risk for Aspiration Impact on safety and function: Mild aspiration risk Other Related Risk Factors: Deconditioning;Cognitive impairment;Previous CVA;History of pneumonia  Short Term Goals: Week 1: SLP Short Term Goal 1 (Week 1): Patient will utilize WRAP memory strategies to recall daily information given maxA SLP Short Term Goal 2 (Week 1): During speech therapy tasks, patient will sustain attention for intervals given maxA SLP Short Term Goal 3 (Week 1): Patient will orient  to time using external aids given modA SLP Short Term Goal 4 (Week 1): Patient will state 2 cognitive deficits given maxA  Refer to Care Plan for Long Term Goals  Recommendations for other services: None   Discharge Criteria: Patient will be discharged from SLP if patient refuses treatment 3 consecutive times without medical reason, if treatment goals not met, if there is a change in medical status, if patient makes no progress towards goals or if patient is discharged from hospital.  The above assessment, treatment plan, treatment alternatives and goals were discussed and mutually agreed upon: by patient  Joane GORMAN Fuss 02/04/2024, 10:55 AM

## 2024-02-04 NOTE — Plan of Care (Signed)
  Problem: RH BOWEL ELIMINATION Goal: RH STG MANAGE BOWEL WITH ASSISTANCE Description: STG Manage Bowel with supervision Assistance. Outcome: Progressing   Problem: RH SAFETY Goal: RH STG ADHERE TO SAFETY PRECAUTIONS W/ASSISTANCE/DEVICE Description: STG Adhere to Safety Precautions With supervision Assistance/Device. Outcome: Progressing   Problem: RH PAIN MANAGEMENT Goal: RH STG PAIN MANAGED AT OR BELOW PT'S PAIN GOAL Description: <4 w/ prns Outcome: Progressing   Problem: Consults Goal: RH GENERAL PATIENT EDUCATION Description: See Patient Education module for education specifics. Outcome: Not Progressing   Problem: RH BLADDER ELIMINATION Goal: RH STG MANAGE BLADDER WITH ASSISTANCE Description: STG Manage Bladder With supervision Assistance Outcome: Not Progressing   Problem: RH SKIN INTEGRITY Goal: RH STG SKIN FREE OF INFECTION/BREAKDOWN Description: Manage skin free of infection with supervision assistance Outcome: Not Progressing   Problem: RH KNOWLEDGE DEFICIT GENERAL Goal: RH STG INCREASE KNOWLEDGE OF SELF CARE AFTER HOSPITALIZATION Description: Manage increase knowledge of self care after hospitalization with supervision assistance from wife using educational materials provided Outcome: Not Progressing

## 2024-02-04 NOTE — Plan of Care (Signed)
  Problem: RH Cognition - SLP Goal: RH LTG Patient will demonstrate orientation with cues Description:  LTG:  Patient will demonstrate orientation to person/place/time/situation with cues (SLP)   Flowsheets (Taken 02/04/2024 1256) LTG Patient will demonstrate orientation to:  Person  Place  Time  Situation LTG: Patient will demonstrate orientation using cueing (SLP): Minimal Assistance - Patient > 75%   Problem: RH Problem Solving Goal: LTG Patient will demonstrate problem solving for (SLP) Description: LTG:  Patient will demonstrate problem solving for basic/complex daily situations with cues  (SLP) Flowsheets (Taken 02/04/2024 1256) LTG: Patient will demonstrate problem solving for (SLP): Basic daily situations LTG Patient will demonstrate problem solving for: Moderate Assistance - Patient 50 - 74%   Problem: RH Memory Goal: LTG Patient will use memory compensatory aids to (SLP) Description: LTG:  Patient will use memory compensatory aids to recall biographical/new, daily complex information with cues (SLP) Flowsheets (Taken 02/04/2024 1256) LTG: Patient will use memory compensatory aids to (SLP): Moderate Assistance - Patient 50 - 74%   Problem: RH Attention Goal: LTG Patient will demonstrate this level of attention during functional activites (SLP) Description: LTG:  Patient will will demonstrate this level of attention during functional activites (SLP) Flowsheets (Taken 02/04/2024 1256) Patient will demonstrate during cognitive/linguistic activities the attention type of: Sustained Patient will demonstrate this level of attention during cognitive/linguistic activities in: Controlled LTG: Patient will demonstrate this level of attention during cognitive/linguistic activities with assistance of (SLP): Minimal Assistance - Patient > 75% Number of minutes patient will demonstrate attention during cognitive/linguistic activities: 15   Problem: RH Awareness Goal: LTG: Patient will  demonstrate awareness during functional activites type of (SLP) Description: LTG: Patient will demonstrate awareness during functional activites type of (SLP) Flowsheets (Taken 02/04/2024 1256) Patient will demonstrate during cognitive/linguistic activities awareness type of: Emergent LTG: Patient will demonstrate awareness during cognitive/linguistic activities with assistance of (SLP): Moderate Assistance - Patient 50 - 74%

## 2024-02-04 NOTE — Progress Notes (Signed)
 PROGRESS NOTE   Subjective/Complaints:  No events overnight.  No complaints this a.m.  Working with therapies, doing well. Vitals stable, labs stable, albumin 2.4    02/04/2024    5:26 AM 02/03/2024    9:35 PM 02/03/2024    4:51 PM  Vitals with BMI  Height   6' 0  Weight   169 lbs 9 oz  BMI   22.99  Systolic 116 124 875  Diastolic 62 59 60  Pulse 63 80 82    Recent Labs    02/03/24 1716 02/03/24 2154 02/04/24 0643  GLUCAP 255* 184* 140*    PO intake 50-85% Last BM 10/20   ROS: Denies fevers, chills, N/V, abdominal pain, constipation, diarrhea, SOB, cough, chest pain, new weakness or paraesthesias.    Objective:   No results found. Recent Labs    02/04/24 0551  WBC 7.7  HGB 10.6*  HCT 31.2*  PLT 300   Recent Labs    02/04/24 0551  NA 137  K 3.6  CL 102  CO2 25  GLUCOSE 163*  BUN 19  CREATININE 0.82  CALCIUM  8.9    Intake/Output Summary (Last 24 hours) at 02/04/2024 0857 Last data filed at 02/04/2024 0534 Gross per 24 hour  Intake --  Output 400 ml  Net -400 ml        Physical Exam: Vital Signs Blood pressure 116/62, pulse 63, temperature 98.2 F (36.8 C), resp. rate 18, height 6' (1.829 m), weight 76.9 kg, SpO2 95%. Constitutional: No apparent distress. Appropriate appearance for age.  Sitting upright in bedside chair. HENT: No JVD. Neck Supple. Trachea midline. Atraumatic, normocephalic. Eyes: PERRLA. EOMI. Visual fields grossly intact.  Cardiovascular: RRR, no murmurs/rub/gallops. No Edema. Peripheral pulses 2+  Respiratory: Mild crackles and expiratory wheezes. On RA.  Abdomen: + bowel sounds, normoactive. No distention or tenderness.  GU: Not examined. +Foley, draining clear urine.  Skin: C/D/I. No apparent lesions.  MSK:      No apparent deformity.       Neurologic exam:  Cognition: AAO to person, place, time and event.   + Mild to moderate cognitive deficits, difficulty  with abstraction  Language: Fluent, No substitutions or neoglisms. No dysarthria.  Insight: Fair insight into current condition.  Mood: Pleasant affect, appropriate mood.  Sensation: To light touch intact in BL UEs and LEs  Reflexes: 2+ in BL UE and LEs. Negative Hoffman's and babinski signs bilaterally.  CN: 2-12 grossly intact.  Coordination: Mild left upper and lower extremity ataxia Spasticity: MAS 0 in all extremities.        Strength:                RUE: 5/5 SA, 5/5 EF, 5/5 EE, 5/5 WE, 5/5 FF, 5/5 FA                LUE:  5-/5 SA, 5-/5 EF, 5-/5 EE, 5-/5 WE, 5-/5 FF, 5-/5 FA                RLE: 5/5 HF, 5/5 KE, 5/5  DF, 5/5  EHL, 5/5  PF  LLE:  3/5 HF, 3/5 KE, 4/5  DF, 4/5  EHL, 4/5  PF   Physical exam unchanged from the above on reexamination 02/04/24    Assessment/Plan: 1. Functional deficits which require 3+ hours per day of interdisciplinary therapy in a comprehensive inpatient rehab setting. Physiatrist is providing close team supervision and 24 hour management of active medical problems listed below. Physiatrist and rehab team continue to assess barriers to discharge/monitor patient progress toward functional and medical goals  Care Tool:  Bathing    Body parts bathed by patient: Right arm, Chest, Left arm, Abdomen, Front perineal area, Face   Body parts bathed by helper: Buttocks, Right upper leg, Left upper leg, Right lower leg, Left lower leg     Bathing assist Assist Level: Maximal Assistance - Patient 24 - 49%     Upper Body Dressing/Undressing Upper body dressing   What is the patient wearing?: Pull over shirt    Upper body assist Assist Level: Moderate Assistance - Patient 50 - 74%    Lower Body Dressing/Undressing Lower body dressing      What is the patient wearing?: Underwear/pull up, Pants     Lower body assist Assist for lower body dressing: Maximal Assistance - Patient 25 - 49%     Toileting Toileting    Toileting assist  Assist for toileting: 2 Helpers     Transfers Chair/bed transfer  Transfers assist     Chair/bed transfer assist level: Total Assistance - Patient < 25%     Locomotion Ambulation   Ambulation assist              Walk 10 feet activity   Assist           Walk 50 feet activity   Assist           Walk 150 feet activity   Assist           Walk 10 feet on uneven surface  activity   Assist           Wheelchair     Assist               Wheelchair 50 feet with 2 turns activity    Assist            Wheelchair 150 feet activity     Assist          Blood pressure 116/62, pulse 63, temperature 98.2 F (36.8 C), resp. rate 18, height 6' (1.829 m), weight 76.9 kg, SpO2 95%.  1. Functional deficits secondary to acute pontine infarct             -patient may  shower             -ELOS/Goals: 9-12 days Min A to supervision              - stable to continue CIR   2.  Antithrombotics: -DVT/anticoagulation:  Pharmaceutical: Lovenox              -antiplatelet therapy: Aspirin  and  Plavix  until 10/28 then aspirin  alone.   3. Pain Management: Oxycodone  and Tylenol  as needed--well-controlled   4. Mood/Behavior/Sleep: LCSW to follow for evaluation and support when available.              -antipsychotic agents: Lexapro  10 mg daily - Delirium : Seroquel  25 mg nightly--tolerating well   5. Neuropsych/cognition: This patient may be intermittently capable of making decisions on their own behalf.   6. Skin/Wound Care: Routine  pressure relief measures             -Stage 2 Sacral wound--wound care orders placed--Gerhardt's butt cream +Zinc  PO   7. Fluids/Electrolytes/Nutrition: Monitor intake and output.  CBC/CMET in a.m.             - SLP consult             -GERD: Continue PPI   -10-25: Albumin 2.4.  Encourage p.o. intakes.   8.  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  Atorvastatin   80 mg daily.   9.  Acute metabolic encephalopathy: Secondary to aspiration pneumonia and hypotension.  Mentation improved continue to monitor.   10. Hypomagnesia: Mg 1.4<2.0<1.5< now 1.6 monitor and replace.    11.  Bladder outlet obstruction: Chronic foley catheter last changed 10/18             -continue CHG and foley catheter care.  - f/u with urology outpatient.    12.  HTN: resumed home Losartan  25 mg daily, monitor BP with increased activity for hypotension    13.  T2DM: A1c 8.5% on 10/7  -monitor cbgs ac/hs with Novolog  SSI and Lantus  15 units   Recent Labs    02/03/24 1716 02/03/24 2154 02/04/24 0643  GLUCAP 255* 184* 140*     14.CAD/HLD: Atorvastatin    15.  Severe sepsis due to aspiration RLL PNA: Initially treated with IV meropenem now transition to p.o. Augmentin  ends 10/24.  ID consulted, Dr. Fleeta Rothman recommended exchanging Foley due to fungemia.             - Aspiration precautions             - Palliative care consult: Patient remains DNR limited   16. Cholelithiasis: Maybe chronic, no current GI symptoms. Continue to monitor.    17.  Heart block: S/p PPM    - Heart rate well-controlled, monitor    02/04/2024    9:57 PM 02/04/2024    3:13 PM 02/04/2024    5:26 AM  Vitals with BMI  Systolic 144 118 883  Diastolic 65 51 62  Pulse 75 66 63       LOS: 1 days A FACE TO FACE EVALUATION WAS PERFORMED  John [name] Likes 02/04/2024, 8:57 AM

## 2024-02-04 NOTE — Evaluation (Signed)
 Physical Therapy Assessment and Plan  Patient Details  Name: John [name] MRN: [id number] Date of Birth: 1945-11-04  PT Diagnosis: Abnormality of gait, Difficulty walking, Hemiparesis non-dominant, Impaired cognition, and Muscle weakness Rehab Potential: Good ELOS: 12-14 days   Today's Date: 02/04/2024 PT Individual Time: 8694-8584 PT Individual Time Calculation (min): 70 min    Hospital Problem: Principal Problem:   CVA (cerebral vascular accident) [facility])   Past Medical History:  Past Medical History:  Diagnosis Date   BPH (benign prostatic hyperplasia)    CTS (carpal tunnel syndrome)    Depression    Diabetes mellitus    Essential hypertension    GERD (gastroesophageal reflux disease)    History of breast cancer    Hypercholesteremia    Neuropathy    Stroke [facility])    Past Surgical History:  Past Surgical History:  Procedure Laterality Date   CATARACT EXTRACTION, BILATERAL  01/18/2017   CORONARY STENT INTERVENTION N/A 08/10/2023   Procedure: CORONARY STENT INTERVENTION;  Surgeon: Darron Deatrice LABOR, MD;  Location: [facility] INVASIVE CV LAB;  Service: Cardiovascular;  Laterality: N/A;   LEFT HEART CATH AND CORONARY ANGIOGRAPHY N/A 08/10/2023   Procedure: LEFT HEART CATH AND CORONARY ANGIOGRAPHY;  Surgeon: Darron Deatrice LABOR, MD;  Location: [facility] INVASIVE CV LAB;  Service: Cardiovascular;  Laterality: N/A;   LITHOTRIPSY     MASTECTOMY Right 05/02/2018   PACEMAKER IMPLANT N/A 05/13/2023   Procedure: PACEMAKER IMPLANT;  Surgeon: Kennyth Chew, MD;  Location: [facility] INVASIVE CV LAB;  Service: Cardiovascular;  Laterality: N/A;   TEMPORARY PACEMAKER N/A 05/11/2023   Procedure: TEMPORARY PACEMAKER;  Surgeon: Jordan, Peter M, MD;  Location: [facility] INVASIVE CV LAB;  Service: Cardiovascular;  Laterality: N/A;   TONSILLECTOMY AND ADENOIDECTOMY     TRANSESOPHAGEAL ECHOCARDIOGRAM (CATH LAB) N/A 12/08/2023   Procedure: TRANSESOPHAGEAL ECHOCARDIOGRAM;  Surgeon: Raford Riggs, MD;  Location: [facility]  INVASIVE CV LAB;  Service: Cardiovascular;  Laterality: N/A;    Assessment & Plan Clinical Impression: Patient is a 78-year-old male with PMHx of CHF with reduced EF, CVA, AVB status post pacemaker, DM2, HTN, HLD, DVT not on anticoagulation, 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 Patient presented to [facility] on 01/16/2024, with complaints of left-sided weakness. An initial CT scan of the head showed no acute abnormalities, and a chest X-ray was negative. Urinalysis was abnormal with a urine culture positive for ESBL E. Coli, and the patient received IV meropenem that was to transition to fosfomycin. Initial lab results were significant for a glucose level of 164 mg/dL, procalcitonin of 534 ng/mL, WBC of 8.5 x 10^9/L, and hemoglobin of 10 g/dL. An EKG showed junctional rhythm with right bundle branch block. Neurology was consulted for further evaluation. The patient was transferred to [facility] on 01/18/2024. An MRI was positive for a right pontine infarct with a possible punctate petechial hemorrhage. A CTA of the head and neck showed severe right posterior cerebral artery stenosis. Neurology felt this was a lacunar infarct and recommended to continue aspirin  and Plavix  for 3 weeks then aspirin  alone.Patient continues with Foley catheter.    The patient was admitted to CIR on 01/24/2024 where he became hypotensive and unresponsive requiring transfer back to acute medicine services.  He was found to be hypotensive with a systolic BP in the 50s.  Code stroke was called and patient was evaluated by neurology who suspected encephalopathy rather than CVA.  Workup: CT head with no acute  findings, EEG suggestive of encephalopathy, labs with metabolic acidosis, increased BUN to creatinine ratio, no leukocytosis, lactic acid 3.6, pyuria and bacteria.  CT showed concerns of right lower lobe bronchopneumonia likely  from aspiration, T12 compression fracture and early cirrhosis. Blood cultures are negative, antibiotics have been transitioned to Augmentin , last day 10/24. Patient has refused speech and swallow evaluation. Seen by palliative care service and decision is for patient to return back to CIR.    Prior to hospitalization the patient required assistance with ADLs and had impaired functional cognition.  Currently requiring max assist for functional mobility and ADLs and mod assist to power up and balance for transfers. Therapy evaluations completed due to patient decreased functional mobility was admitted for a comprehensive rehab program.  Patient currently requires mod with mobility secondary to muscle weakness, decreased cardiorespiratoy endurance, impaired timing and sequencing, unbalanced muscle activation, and decreased coordination, decreased initiation, decreased attention, decreased problem solving, decreased safety awareness, and decreased memory, and decreased sitting balance, decreased standing balance, decreased postural control, hemiplegia, and decreased balance strategies.  Prior to hospitalization, patient was independent  with mobility and lived with Spouse in a House home.  Home access is 3Stairs to enter.  Patient will benefit from skilled PT intervention to maximize safe functional mobility, minimize fall risk, and decrease caregiver burden for planned discharge home with 24 hour supervision.  Anticipate patient will benefit from follow up HH at discharge.  PT - End of Session Activity Tolerance: Tolerates 30+ min activity with multiple rests Endurance Deficit: Yes Endurance Deficit Description: generalized weakness PT Assessment Rehab Potential (ACUTE/IP ONLY): Good PT Barriers to Discharge: Inaccessible home environment;Decreased caregiver support;Home environment access/layout PT Barriers to Discharge Comments: lives with wife, unsure of physical assist pt wife can provide, 4 STE  BHRs PT Patient demonstrates impairments in the following area(s): Behavior;Balance;Motor;Pain;Perception;Safety;Sensory;Endurance PT Transfers Functional Problem(s): Bed Mobility;Bed to Chair;Car PT Locomotion Functional Problem(s): Ambulation;Stairs PT Plan PT Intensity: Minimum of 1-2 x/day ,45 to 90 minutes PT Frequency: 5 out of 7 days PT Duration Estimated Length of Stay: 12-14 days PT Treatment/Interventions: Ambulation/gait training;Community reintegration;DME/adaptive equipment instruction;Neuromuscular re-education;Psychosocial support;Stair training;UE/LE Strength taining/ROM;Wheelchair propulsion/positioning;UE/LE Coordination activities;Therapeutic Activities;Skin care/wound management;Pain management;Functional electrical stimulation;Discharge planning;Balance/vestibular training;Cognitive remediation/compensation;Disease management/prevention;Functional mobility training;Patient/family education;Splinting/orthotics;Therapeutic Exercise PT Transfers Anticipated Outcome(s): supervision PT Locomotion Anticipated Outcome(s): supervision PT Recommendation Recommendations for Other Services: Neuropsych consult Follow Up Recommendations: Home health PT Patient destination: Home Equipment Recommended: To be determined   PT Evaluation Precautions/Restrictions Precautions Precautions: Fall Precaution Booklet Issued: No Recall of Precautions/Restrictions: Impaired Precaution/Restrictions Comments: L hemiparesis Restrictions Weight Bearing Restrictions Per Provider Order: No Pain Interference Pain Interference Pain Effect on Sleep: 1. Rarely or not at all Pain Interference with Therapy Activities: 1. Rarely or not at all Pain Interference with Day-to-Day Activities: 1. Rarely or not at all Home Living/Prior Functioning Home Living Available Help at Discharge: Family Type of Home: House Home Access: Stairs to enter Secretary/administrator of Steps: 3 Entrance Stairs-Rails: Can  reach both;Right;Left Home Layout: Able to live on main level with bedroom/bathroom;Multi-level Alternate Level Stairs-Number of Steps: He reported sleeping in a recliner on the main level of the home. All bedrooms are upstairs Alternate Level Stairs-Rails: Right;Left;Can reach both Bathroom Shower/Tub: Health Visitor: Standard Bathroom Accessibility: Yes Additional Comments: DC from SNF 10/6 after tx for comp fx and admitted with CVA same date  Lives With: Spouse Prior Function Level of Independence: Independent with basic ADLs;Independent with transfers;Independent with gait  Able to Take Stairs?: Yes Driving: Yes  Vocation: Retired Music Therapist Level: Oriented to person;Oriented to time;Disoriented to time;Disoriented to situation Year: 2025 Month: October Day of Week: Correct Attention: Sustained Sustained Attention: Impaired Sustained Attention Impairment: Verbal basic;Functional basic Memory: Impaired Memory Impairment: Decreased short term memory;Storage deficit;Decreased recall of new information Decreased Short Term Memory: Verbal basic;Functional basic Awareness: Impaired Awareness Impairment: Intellectual impairment Problem Solving: Impaired Problem Solving Impairment: Verbal basic;Functional basic Executive Function: Initiating;Self Monitoring Initiating: Impaired Initiating Impairment: Functional basic;Verbal basic Self Monitoring: Impaired Self Monitoring Impairment: Functional basic;Verbal basic Safety/Judgment: Impaired Comments: Patient appeared to utilize sarcasm as a deflecting mechanism Sensation Sensation Light Touch: Appears Intact Hot/Cold: Appears Intact Proprioception: Appears Intact Stereognosis: Not tested Coordination Gross Motor Movements are Fluid and Coordinated: No Fine Motor Movements are Fluid and Coordinated: No Coordination and Movement Description: Lt hemi Finger Nose Finger Test: WNL on R, dysmetria on the  L Motor  Motor Motor: Hemiplegia Motor - Skilled Clinical Observations: L hemi, generalized weakness  Trunk/Postural Assessment  Cervical Assessment Cervical Assessment: Exceptions to [facility] (forward head) Thoracic Assessment Thoracic Assessment: Exceptions to [facility] (rounded shoulders) Lumbar Assessment Lumbar Assessment: Exceptions to [facility] (posterior pelvic tilt) Postural Control Postural Control: Deficits on evaluation Trunk Control: delayed Righting Reactions: delayed Protective Responses: delayed  Balance Balance Balance Assessed: Yes Standardized Balance Assessment Standardized Balance Assessment: Timed Up and Go Test Timed Up and Go Test TUG: Normal TUG Normal TUG (seconds): 60.42 Static Sitting Balance Static Sitting - Balance Support: Feet supported Static Sitting - Level of Assistance: 4: Min assist Dynamic Sitting Balance Dynamic Sitting - Balance Support: Feet supported Dynamic Sitting - Level of Assistance: 4: Min assist Static Standing Balance Static Standing - Balance Support: During functional activity;Bilateral upper extremity supported Static Standing - Level of Assistance: 4: Min assist Dynamic Standing Balance Dynamic Standing - Balance Support: Bilateral upper extremity supported;During functional activity Dynamic Standing - Level of Assistance: 3: Mod assist Extremity Assessment  RLE Assessment RLE Assessment: Within Functional Limits LLE Assessment LLE Assessment: Exceptions to [facility] General Strength Comments: tested in supine LLE Strength Left Hip Flexion: 3+/5 Left Hip Extension: 3/5 Left Knee Flexion: 3+/5 Left Knee Extension: 3+/5 Left Ankle Dorsiflexion: 4/5 Left Ankle Plantar Flexion: 3+/5  Care Tool Care Tool Bed Mobility Roll left and right activity   Roll left and right assist level: Moderate Assistance - Patient 50 - 74%    Sit to lying activity   Sit to lying assist level: Moderate Assistance - Patient 50 - 74%    Lying to sitting on  side of bed activity   Lying to sitting on side of bed assist level: the ability to move from lying on the back to sitting on the side of the bed with no back support.: Moderate Assistance - Patient 50 - 74%     Care Tool Transfers Sit to stand transfer   Sit to stand assist level: Moderate Assistance - Patient 50 - 74%    Chair/bed transfer   Chair/bed transfer assist level: Moderate Assistance - Patient 50 - 74%    Car transfer   Car transfer assist level: Moderate Assistance - Patient 50 - 74%      Care Tool Locomotion Ambulation   Assist level: 2 helpers Assistive device: Walker-rolling Max distance: 112'  Walk 10 feet activity   Assist level: 2 helpers Assistive device: Walker-rolling   Walk 50 feet with 2 turns activity   Assist level: 2 helpers Assistive device: Walker-rolling  Walk 150 feet activity   Assist level: 2 helpers Assistive  device: Walker-rolling  Walk 10 feet on uneven surfaces activity Walk 10 feet on uneven surfaces activity did not occur: Safety/medical concerns      Stairs   Assist level: 2 helpers Stairs assistive device: 2 hand rails Max number of stairs: 4  Walk up/down 1 step activity   Walk up/down 1 step (curb) assist level: 2 helpers Walk up/down 1 step or curb assistive device: 2 hand rails  Walk up/down 4 steps activity   Walk up/down 4 steps assist level: 2 helpers Walk up/down 4 steps assistive device: 2 hand rails  Walk up/down 12 steps activity Walk up/down 12 steps activity did not occur: Safety/medical concerns      Pick up small objects from floor Pick up small object from the floor (from standing position) activity did not occur: Safety/medical concerns      Wheelchair Is the patient using a wheelchair?: Yes Type of Wheelchair: Manual   Wheelchair assist level: Dependent - Patient 0%    Wheel 50 feet with 2 turns activity   Assist Level: Dependent - Patient 0%  Wheel 150 feet activity   Assist Level: Dependent - Patient  0%    Refer to Care Plan for Long Term Goals  SHORT TERM GOAL WEEK 1 PT Short Term Goal 1 (Week 1): Pt will perform supine<->sit with CGA PT Short Term Goal 2 (Week 1): Pt will ambulate 150' with RW min assist PT Short Term Goal 3 (Week 1): Pt will complete sit to stand with min assist consistently PT Short Term Goal 4 (Week 1): Pt will complete up/down 4 steps BHRs with CGA  Recommendations for other services: Neuropsych  Skilled Therapeutic Intervention Evaluation completed (see details above and below) with education on PT POC and goals and individual treatment initiated with focus on therapeutic activities for transfer training and bed mobility as well as gait training for upright tolerance and RW management. Pt denies pain They always ask me that and the answer is always no! Pt completes bed mobility with mod assist with hospital bed rails, cues for initiating mobility. Pt completes sit to stand transfer with mod assist with RW, completes stand step transfer with RW min/mod assist to WC. Pt ambulates with RW min assist 112' with pt demonstrating forward trunk lean, decreased LLE foot clearance, +2 WC follow for safety. Pt then completes up/down 4 steps with BHRs with mod assist, +2 present for safety but providing CGA only. Pt completes TUG in 60.42 seconds, indicating increased risk for falls. Pt completes car transfer with mod assist for managing LLE into/out of car, skilled cues provided for sequencing. Pt provided with seated rest breaks between all gait trials and exercises to promote energy conservation and quality with tasks. Pt returns to room and returns to bed where he remains semi reclined with all needs within reach, call light in place, bed alarm activated at end of session.   Mobility Bed Mobility Bed Mobility: Sit to Supine;Supine to Sit Rolling Right: Moderate Assistance - Patient 50-74% Left Sidelying to Sit: Moderate Assistance - Patient 50-74% Supine to Sit: Moderate  Assistance - Patient 50-74% Sit to Supine: Moderate Assistance - Patient 50-74% Transfers Transfers: Sit to Stand;Stand to Sit;Stand Pivot Transfers Sit to Stand: Moderate Assistance - Patient 50-74% Stand to Sit: Moderate Assistance - Patient 50-74% Stand Pivot Transfers: Moderate Assistance - Patient 50 - 74% Stand Pivot Transfer Details: Verbal cues for sequencing;Verbal cues for gait pattern;Verbal cues for technique;Verbal cues for safe use of DME/AE Transfer (Assistive device):  Rolling walker Locomotion  Gait Ambulation: Yes Gait Assistance: Minimal Assistance - Patient > 75%;2 Helpers Gait Distance (Feet): 112 Feet Assistive device: Rolling walker Gait Assistance Details: Verbal cues for technique;Verbal cues for safe use of DME/AE;Verbal cues for gait pattern;Verbal cues for precautions/safety Gait Assistance Details: +2 WC follow for safety Gait Gait: Yes Gait Pattern: Poor foot clearance - left;Trunk flexed;Step-to pattern Gait velocity: decr Stairs / Additional Locomotion Stairs: Yes Stairs Assistance: Moderate Assistance - Patient 50 - 74%;2 Helpers Stair Management Technique: Two rails Number of Stairs: 4 Height of Stairs: 6 Wheelchair Mobility Wheelchair Mobility: No   Discharge Criteria: Patient will be discharged from PT if patient refuses treatment 3 consecutive times without medical reason, if treatment goals not met, if there is a change in medical status, if patient makes no progress towards goals or if patient is discharged from hospital.  The above assessment, treatment plan, treatment alternatives and goals were discussed and mutually agreed upon: by patient  Reche Ohara PT, DPT 02/04/2024, 2:21 PM

## 2024-02-04 NOTE — Progress Notes (Signed)
 Occupational Therapy Assessment and Plan  Patient Details  Name: John [name] MRN: [id number] Date of Birth: 10/03/1945  OT Diagnosis: cognitive deficits, hemiplegia affecting non-dominant side, and muscle weakness (generalized) Rehab Potential: Rehab Potential (ACUTE ONLY): Fair ELOS: 2-3 weeks   Today's Date: 02/04/2024 OT Individual Time: 9269-9154 OT Individual Time Calculation (min): 75 min     Hospital Problem: Principal Problem:   CVA (cerebral vascular accident) [facility])   Past Medical History:  Past Medical History:  Diagnosis Date   BPH (benign prostatic hyperplasia)    CTS (carpal tunnel syndrome)    Depression    Diabetes mellitus    Essential hypertension    GERD (gastroesophageal reflux disease)    History of breast cancer    Hypercholesteremia    Neuropathy    Stroke [facility])    Past Surgical History:  Past Surgical History:  Procedure Laterality Date   CATARACT EXTRACTION, BILATERAL  01/18/2017   CORONARY STENT INTERVENTION N/A 08/10/2023   Procedure: CORONARY STENT INTERVENTION;  Surgeon: Darron Deatrice LABOR, MD;  Location: [facility] INVASIVE CV LAB;  Service: Cardiovascular;  Laterality: N/A;   LEFT HEART CATH AND CORONARY ANGIOGRAPHY N/A 08/10/2023   Procedure: LEFT HEART CATH AND CORONARY ANGIOGRAPHY;  Surgeon: Darron Deatrice LABOR, MD;  Location: [facility] INVASIVE CV LAB;  Service: Cardiovascular;  Laterality: N/A;   LITHOTRIPSY     MASTECTOMY Right 05/02/2018   PACEMAKER IMPLANT N/A 05/13/2023   Procedure: PACEMAKER IMPLANT;  Surgeon: Kennyth Chew, MD;  Location: [facility] INVASIVE CV LAB;  Service: Cardiovascular;  Laterality: N/A;   TEMPORARY PACEMAKER N/A 05/11/2023   Procedure: TEMPORARY PACEMAKER;  Surgeon: Jordan, Peter M, MD;  Location: [facility] INVASIVE CV LAB;  Service: Cardiovascular;  Laterality: N/A;   TONSILLECTOMY AND ADENOIDECTOMY     TRANSESOPHAGEAL ECHOCARDIOGRAM (CATH LAB) N/A 12/08/2023   Procedure: TRANSESOPHAGEAL ECHOCARDIOGRAM;  Surgeon: Raford Riggs,  MD;  Location: [facility] INVASIVE CV LAB;  Service: Cardiovascular;  Laterality: N/A;    Assessment & Plan Clinical Impression: John [name] is a 78 year old male with PMHx of CHF with reduced EF, CVA, AVB status post pacemaker, DM2, HTN, HLD, DVT not on anticoagulation, 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 Patient presented to [facility] on [facility], with complaints of left-sided weakness. An initial CT scan of the head showed no acute abnormalities, and a chest X-ray was negative. Urinalysis was abnormal with a urine culture positive for ESBL E. Coli, and the patient received IV meropenem that was to transition to fosfomycin. Initial lab results were significant for a glucose level of 164 mg/dL, procalcitonin of 534 ng/mL, WBC of 8.5 x 10^9/L, and hemoglobin of 10 g/dL. An EKG showed junctional rhythm with right bundle branch block. Neurology was consulted for further evaluation. The patient was transferred to [facility] on 01/18/2024. An MRI was positive for a right pontine infarct with a possible punctate petechial hemorrhage. A CTA of the head and neck showed severe right posterior cerebral artery stenosis. Neurology felt this was a lacunar infarct and recommended to continue aspirin  and Plavix  for 3 weeks then aspirin  alone.Patient continues with Foley catheter.    The patient was admitted to CIR on 01/24/2024 where he became hypotensive and unresponsive requiring transfer back to acute medicine services.  He was found to be hypotensive with a systolic BP in the 50s.  Code stroke was called and patient was evaluated by neurology who suspected encephalopathy rather than CVA.  Workup:  CT head with no acute findings, EEG suggestive of encephalopathy, labs with metabolic acidosis, increased BUN to creatinine ratio, no leukocytosis, lactic acid 3.6, pyuria and bacteria.  CT showed concerns of right lower  lobe bronchopneumonia likely from aspiration, T12 compression fracture and early cirrhosis. Blood cultures are negative, antibiotics have been transitioned to Augmentin , last day 10/24. Patient has refused speech and swallow evaluation. Seen by palliative care service and decision is for patient to return back to CIR.    Prior to hospitalization the patient required assistance with ADLs and had impaired functional cognition.  Currently requiring max assist for functional mobility and ADLs and mod assist to power up and balance for transfers. Therapy evaluations completed due to patient decreased functional mobility was admitted for a comprehensive rehab program.  Patient transferred to CIR on 02/03/2024 .    Patient currently requires max with basic self-care skills secondary to muscle weakness, decreased cardiorespiratoy endurance, decreased motor planning, decreased initiation, decreased attention, decreased awareness, decreased problem solving, decreased safety awareness, decreased memory, and delayed processing, and decreased sitting balance, decreased standing balance, decreased postural control, hemiplegia, and decreased balance strategies.  Prior to hospitalization, patient could complete ADLs with modified independent .  Patient will benefit from skilled intervention to increase independence with basic self-care skills prior to discharge home with care partner.  Anticipate patient will require 24 hour supervision and minimal physical assistance and follow up home health.  OT - End of Session Activity Tolerance: Tolerates 10 - 20 min activity with multiple rests Endurance Deficit: Yes Endurance Deficit Description: generalized weakness OT Assessment Rehab Potential (ACUTE ONLY): Fair OT Barriers to Discharge: Behavior OT Barriers to Discharge Comments: pt often reluctant/resistant to rehab efforts OT Patient demonstrates impairments in the following area(s):  Balance;Behavior;Safety;Cognition;Endurance;Motor;Sensory OT Basic ADL's Functional Problem(s): Bathing;Dressing;Toileting OT Advanced ADL's Functional Problem(s): None OT Transfers Functional Problem(s): Toilet;Tub/Shower OT Additional Impairment(s): Fuctional Use of Upper Extremity OT Plan OT Intensity: Minimum of 1-2 x/day, 45 to 90 minutes OT Frequency: 5 out of 7 days OT Duration/Estimated Length of Stay: 2-3 weeks OT Treatment/Interventions: Balance/vestibular training;Discharge planning;Functional electrical stimulation;Pain management;Self Care/advanced ADL retraining;Therapeutic Activities;UE/LE Coordination activities;Cognitive remediation/compensation;Disease mangement/prevention;Functional mobility training;Patient/family education;Therapeutic Exercise;Skin care/wound managment;Visual/perceptual remediation/compensation;Community reintegration;DME/adaptive equipment instruction;Neuromuscular re-education;Psychosocial support;Splinting/orthotics;UE/LE Strength taining/ROM;Wheelchair propulsion/positioning OT Self Feeding Anticipated Outcome(s): no goal OT Basic Self-Care Anticipated Outcome(s): min A OT Toileting Anticipated Outcome(s): min A OT Bathroom Transfers Anticipated Outcome(s): min A OT Recommendation Recommendations for Other Services: Speech consult;Neuropsych consult Patient destination: Home Follow Up Recommendations: Home health OT;24 hour supervision/assistance Equipment Recommended: 3 in 1 bedside comode   OT Evaluation Precautions/Restrictions  Precautions Precautions: Fall Precaution/Restrictions Comments: L hemiparesis Restrictions Weight Bearing Restrictions Per Provider Order: No General Chart Reviewed: Yes Family/Caregiver Present: No   Home Living/Prior Functioning Home Living Family/patient expects to be discharged to:: Private residence Living Arrangements: Spouse/significant other Available Help at Discharge: Family Type of Home: House Home  Access: Stairs to enter Secretary/administrator of Steps: 4 Entrance Stairs-Rails: Can reach both Home Layout: Able to live on main level with bedroom/bathroom, Multi-level Alternate Level Stairs-Number of Steps: He reported sleeping in a recliner on the main level of the home. All bedrooms are upstairs Alternate Level Stairs-Rails: Right, Left, Can reach both Bathroom Shower/Tub: Health Visitor: Standard Bathroom Accessibility: Yes Additional Comments: DC from SNF 10/6 after tx for comp fx and admitted with CVA same date  Lives With: Spouse IADL History Homemaking Responsibilities: No Current License: Yes Mode of Transportation: Car Occupation: Retired Leisure and Hobbies:  Spending time on the computer and getting back to reading. Prior Function Level of Independence: Independent with basic ADLs, Independent with transfers, Independent with gait  Able to Take Stairs?: Yes Driving: Yes Vocation: Retired Administrator, Sports Baseline Vision/History: 1 Wears glasses Wears Glasses: Reading only Ability to See in Adequate Light: 0 Adequate Patient Visual Report: No change from baseline Vision Assessment?: No apparent visual deficits Perception  Perception: Within Functional Limits Praxis Praxis: WFL Cognition Cognition Overall Cognitive Status: Impaired/Different from baseline Arousal/Alertness: Awake/alert Orientation Level: Person;Situation Memory: Impaired Memory Impairment: Decreased short term memory;Storage deficit Decreased Short Term Memory: Verbal basic;Functional basic Attention: Sustained Sustained Attention: Impaired Sustained Attention Impairment: Verbal basic;Functional basic Awareness: Impaired Awareness Impairment: Intellectual impairment Problem Solving: Impaired Problem Solving Impairment: Verbal basic;Functional basic Executive Function: Initiating;Self Monitoring Initiating: Impaired Initiating Impairment: Functional basic;Verbal basic Self  Monitoring: Impaired Self Monitoring Impairment: Functional basic;Verbal basic Safety/Judgment: Impaired Comments: Pt withdrawn, often refusing to participate. Somewhat limited eval d/t behaviors Brief Interview for Mental Status (BIMS) Repetition of Three Words (First Attempt): 3 Temporal Orientation: Year: Correct Temporal Orientation: Month: Accurate within 5 days Temporal Orientation: Day: Incorrect Recall: Sock: No answer Recall: Blue: Yes, no cue required Recall: Bed: No answer BIMS Summary Score: 10 Sensation Sensation Light Touch: Appears Intact Coordination Gross Motor Movements are Fluid and Coordinated: No Fine Motor Movements are Fluid and Coordinated: No Coordination and Movement Description: Lt hemi Finger Nose Finger Test: WNL on R, dysmetria on the L Motor  Motor Motor: Hemiplegia Motor - Skilled Clinical Observations: L hemi, generalized weakness  Trunk/Postural Assessment  Cervical Assessment Cervical Assessment: Exceptions to [facility] (forward head) Thoracic Assessment Thoracic Assessment: Exceptions to [facility] (rounded shoulders) Lumbar Assessment Lumbar Assessment: Exceptions to [facility] (posterior pelvic tilt) Postural Control Postural Control: Deficits on evaluation Trunk Control: delayed Righting Reactions: delayed Protective Responses: delayed  Balance Balance Balance Assessed: Yes Static Sitting Balance Static Sitting - Balance Support: Feet supported Static Sitting - Level of Assistance: 3: Mod assist Dynamic Sitting Balance Dynamic Sitting - Balance Support: Feet supported Dynamic Sitting - Level of Assistance: 3: Mod assist Static Standing Balance Static Standing - Balance Support: Right upper extremity supported Static Standing - Level of Assistance: 2: Max assist Static Standing - Comment/# of Minutes: With BUE support pt able to stand with min-mod A. Without BUE support he was total - +2 support Dynamic Standing Balance Dynamic Standing -  Balance Support: No upper extremity supported Dynamic Standing - Level of Assistance: 1: +1 Total assist Extremity/Trunk Assessment RUE Assessment RUE Assessment: Within Functional Limits General Strength Comments: 3+/5 LUE Assessment LUE Assessment: Exceptions to [facility] Active Range of Motion (AROM) Comments: Pt reluctant to participate- demonstrated shoulder flexion to 80 degrees, 75% of elbow ROM and 3+/5 grasp strength. Suspect there is more active movement. Coordination deficits LUE Body System: Neuro Brunstrum levels for arm and hand: Arm;Hand Brunstrum level for arm: Stage IV Movement is deviating from synergy Brunstrum level for hand: Stage V Independence from basic synergies  Care Tool Care Tool Self Care Eating   Eating Assist Level: Set up assist    Oral Care    Oral Care Assist Level: Supervision/Verbal cueing    Bathing   Body parts bathed by patient: Right arm;Chest;Left arm;Abdomen;Front perineal area;Face Body parts bathed by helper: Buttocks;Right upper leg;Left upper leg;Right lower leg;Left lower leg   Assist Level: Maximal Assistance - Patient 24 - 49%    Upper Body Dressing(including orthotics)   What is the patient wearing?: Pull over shirt  Assist Level: Moderate Assistance - Patient 50 - 74%    Lower Body Dressing (excluding footwear)   What is the patient wearing?: Underwear/pull up;Pants Assist for lower body dressing: Maximal Assistance - Patient 25 - 49%    Putting on/Taking off footwear   What is the patient wearing?: Non-skid slipper socks Assist for footwear: Dependent - Patient 0%       Care Tool Toileting Toileting activity   Assist for toileting: 2 Helpers     Care Tool Bed Mobility Roll left and right activity   Roll left and right assist level: Moderate Assistance - Patient 50 - 74%    Sit to lying activity   Sit to lying assist level: Moderate Assistance - Patient 50 - 74%    Lying to sitting on side of bed activity   Lying to  sitting on side of bed assist level: the ability to move from lying on the back to sitting on the side of the bed with no back support.: Moderate Assistance - Patient 50 - 74%     Care Tool Transfers Sit to stand transfer   Sit to stand assist level: Total Assistance - Patient < 25%    Chair/bed transfer   Chair/bed transfer assist level: Total Assistance - Patient < 25%     Toilet transfer   Assist Level: Total Assistance - Patient < 25%     Care Tool Cognition  Expression of Ideas and Wants Expression of Ideas and Wants: 2. Frequent difficulty - frequently exhibits difficulty with expressing needs and ideas  Understanding Verbal and Non-Verbal Content Understanding Verbal and Non-Verbal Content: 2. Sometimes understands - understands only basic conversations or simple, direct phrases. Frequently requires cues to understand   Memory/Recall Ability Memory/Recall Ability : Current season   Refer to Care Plan for Long Term Goals  SHORT TERM GOAL WEEK 1 OT Short Term Goal 1 (Week 1): Pt will complete LB dressing at Max A with AE as necessary OT Short Term Goal 2 (Week 1): Pt will complete toilet transfer at Max A with LRAD OT Short Term Goal 3 (Week 1): Pt will complete UB dressing with min A OT Short Term Goal 4 (Week 1): Pt will demo improved awareness to deficits by identifying one deficit with mod vc  Recommendations for other services: Neuropsych   Skilled Therapeutic Intervention ADL ADL Eating: Set up Where Assessed-Eating: Chair Grooming: Setup Where Assessed-Grooming: Sitting at sink Upper Body Bathing: Moderate assistance Where Assessed-Upper Body Bathing: Wheelchair Lower Body Bathing: Maximal assistance Where Assessed-Lower Body Bathing: Sitting at sink Upper Body Dressing: Moderate assistance Where Assessed-Upper Body Dressing: Sitting at sink Lower Body Dressing: Maximal assistance Where Assessed-Lower Body Dressing: Sitting at sink;Standing at sink Toileting:  Dependent Where Assessed-Toileting: Bedside Commode Toilet Transfer: Maximal assistance (+2) Toilet Transfer Method: Stand pivot Toilet Transfer Equipment: Bedside commode Tub/Shower Transfer: Unable to assess Mobility  Bed Mobility Bed Mobility: Sit to Supine;Supine to Sit Rolling Right: Moderate Assistance - Patient 50-74% Left Sidelying to Sit: Moderate Assistance - Patient 50-74% Supine to Sit: Moderate Assistance - Patient 50-74% Transfers Sit to Stand: Total Assistance - Patient < 25% Stand to Sit: Maximal Assistance - Patient 25-49%  Skilled OT evaluation completed with the creation of pt centered OT POC. Pt educated on condition, ELOS, rehab expectations, and fall risk reduction strategies throughout session. Evaluation somewhat limited d/t pt behaviors- withdrawn and often refusing to participate in assessments. Patient required increased time for initiation, cuing, rest breaks, and for completion of  tasks throughout session. Utilized therapeutic use of self throughout to promote efficiency. Much of ADL assessment via clinical judgement. Heavy max +2 transfer to w/c without BUE support with premature descent, requiring heavy assist, cueing and intervention from OT to prevent fall. With RW use pt was able to stand with max A and pivot with mod A. Pt is attending to the LUE well and used it functionally to eat breakfast without cueing. Anticipate 2-3 week LOS if pt can participate. Min A goals set. Pt left supine with all needs met. Bed alarm set.    Discharge Criteria: Patient will be discharged from OT if patient refuses treatment 3 consecutive times without medical reason, if treatment goals not met, if there is a change in medical status, if patient makes no progress towards goals or if patient is discharged from hospital.  The above assessment, treatment plan, treatment alternatives and goals were discussed and mutually agreed upon: by patient  Nena VEAR Moats 02/04/2024, 8:44 AM

## 2024-02-05 DIAGNOSIS — I639 Cerebral infarction, unspecified: Secondary | ICD-10-CM | POA: Diagnosis not present

## 2024-02-05 LAB — GLUCOSE, CAPILLARY
Glucose-Capillary: 161 mg/dL — ABNORMAL HIGH (ref 70–99)
Glucose-Capillary: 155 mg/dL — ABNORMAL HIGH (ref 70–99)
Glucose-Capillary: 299 mg/dL — ABNORMAL HIGH (ref 70–99)
Glucose-Capillary: 276 mg/dL — ABNORMAL HIGH (ref 70–99)

## 2024-02-05 MED ORDER — SENNOSIDES-DOCUSATE SODIUM 8.6-50 MG PO TABS
1.0000 | ORAL_TABLET | Freq: Two times a day (BID) | ORAL | Status: DC
  Administered 2024-02-05 – 2024-02-17 (×24): 1 via ORAL
  Filled 2024-02-05 (×28): qty 1

## 2024-02-05 MED ORDER — INSULIN GLARGINE-YFGN 100 UNIT/ML ~~LOC~~ SOLN
20.0000 [IU] | Freq: Every day | SUBCUTANEOUS | Status: DC
  Administered 2024-02-06: 20 [IU] via SUBCUTANEOUS
  Filled 2024-02-05: qty 0.2

## 2024-02-05 MED ORDER — POLYETHYLENE GLYCOL 3350 17 G PO PACK: 17.0000 g | PACK | Freq: Every day | ORAL | Status: DC | PRN

## 2024-02-05 MED ORDER — SORBITOL 70 % SOLN: 30.0000 mL | Freq: Every day | Status: DC | PRN

## 2024-02-05 MED ORDER — INSULIN ASPART 100 UNIT/ML IJ SOLN
0.0000 [IU] | Freq: Three times a day (TID) | INTRAMUSCULAR | Status: DC
  Administered 2024-02-05: 5 [IU] via SUBCUTANEOUS
  Administered 2024-02-05 – 2024-02-06 (×3): 2 [IU] via SUBCUTANEOUS
  Administered 2024-02-06 – 2024-02-07 (×2): 5 [IU] via SUBCUTANEOUS
  Administered 2024-02-07: 2 [IU] via SUBCUTANEOUS
  Administered 2024-02-07: 5 [IU] via SUBCUTANEOUS
  Administered 2024-02-08 (×3): 2 [IU] via SUBCUTANEOUS
  Administered 2024-02-09 (×3): 1 [IU] via SUBCUTANEOUS
  Administered 2024-02-10 – 2024-02-11 (×3): 2 [IU] via SUBCUTANEOUS
  Administered 2024-02-12: 1 [IU] via SUBCUTANEOUS
  Administered 2024-02-12 – 2024-02-13 (×3): 2 [IU] via SUBCUTANEOUS
  Administered 2024-02-13: 1 [IU] via SUBCUTANEOUS
  Administered 2024-02-13: 3 [IU] via SUBCUTANEOUS
  Administered 2024-02-14: 1 [IU] via SUBCUTANEOUS
  Administered 2024-02-14: 2 [IU] via SUBCUTANEOUS
  Administered 2024-02-14: 5 [IU] via SUBCUTANEOUS
  Administered 2024-02-15: 1 [IU] via SUBCUTANEOUS
  Administered 2024-02-15: 3 [IU] via SUBCUTANEOUS
  Administered 2024-02-16: 1 [IU] via SUBCUTANEOUS
  Administered 2024-02-16: 3 [IU] via SUBCUTANEOUS
  Administered 2024-02-17: 2 [IU] via SUBCUTANEOUS
  Administered 2024-02-17: 3 [IU] via SUBCUTANEOUS
  Administered 2024-02-18 – 2024-02-19 (×2): 2 [IU] via SUBCUTANEOUS
  Administered 2024-02-19: 7 [IU] via SUBCUTANEOUS
  Administered 2024-02-20: 1 [IU] via SUBCUTANEOUS
  Filled 2024-02-05: qty 3
  Filled 2024-02-05: qty 1
  Filled 2024-02-05 (×2): qty 2
  Filled 2024-02-05: qty 3
  Filled 2024-02-05: qty 1
  Filled 2024-02-05: qty 3
  Filled 2024-02-05 (×2): qty 2
  Filled 2024-02-05: qty 5
  Filled 2024-02-05: qty 7
  Filled 2024-02-05 (×3): qty 2
  Filled 2024-02-05 (×3): qty 1

## 2024-02-05 MED ORDER — POLYETHYLENE GLYCOL 3350 17 G PO PACK
17.0000 g | PACK | Freq: Every day | ORAL | Status: DC
  Administered 2024-02-05 – 2024-02-17 (×10): 17 g via ORAL
  Filled 2024-02-05 (×13): qty 1

## 2024-02-05 MED ORDER — CHLORHEXIDINE GLUCONATE CLOTH 2 % EX PADS
6.0000 | MEDICATED_PAD | Freq: Two times a day (BID) | CUTANEOUS | Status: DC
  Administered 2024-02-05 – 2024-02-12 (×12): 6 via TOPICAL

## 2024-02-05 MED ORDER — INSULIN GLARGINE-YFGN 100 UNIT/ML ~~LOC~~ SOLN: 18.0000 [IU] | Freq: Every day | SUBCUTANEOUS | Status: DC

## 2024-02-05 NOTE — Progress Notes (Signed)
 Inpatient Rehabilitation Care Coordinator Assessment and Plan Patient Details  Name: John [name] MRN: [id number] Date of Birth: [birth year]  Today's Date: 02/05/2024  Hospital Problems: Principal Problem:   CVA (cerebral vascular accident) [facility])  Past Medical History:  Past Medical History:  Diagnosis Date   BPH (benign prostatic hyperplasia)    CTS (carpal tunnel syndrome)    Depression    Diabetes mellitus    Essential hypertension    GERD (gastroesophageal reflux disease)    History of breast cancer    Hypercholesteremia    Neuropathy    Stroke [facility])    Past Surgical History:  Past Surgical History:  Procedure Laterality Date   CATARACT EXTRACTION, BILATERAL  01/18/2017   CORONARY STENT INTERVENTION N/A 08/10/2023   Procedure: CORONARY STENT INTERVENTION;  Surgeon: Darron Deatrice LABOR, MD;  Location: [facility] INVASIVE CV LAB;  Service: Cardiovascular;  Laterality: N/A;   LEFT HEART CATH AND CORONARY ANGIOGRAPHY N/A 08/10/2023   Procedure: LEFT HEART CATH AND CORONARY ANGIOGRAPHY;  Surgeon: Darron Deatrice LABOR, MD;  Location: [facility] INVASIVE CV LAB;  Service: Cardiovascular;  Laterality: N/A;   LITHOTRIPSY     MASTECTOMY Right 05/02/2018   PACEMAKER IMPLANT N/A 05/13/2023   Procedure: PACEMAKER IMPLANT;  Surgeon: Kennyth Chew, MD;  Location: [facility] INVASIVE CV LAB;  Service: Cardiovascular;  Laterality: N/A;   TEMPORARY PACEMAKER N/A 05/11/2023   Procedure: TEMPORARY PACEMAKER;  Surgeon: Jordan, Peter M, MD;  Location: [facility] INVASIVE CV LAB;  Service: Cardiovascular;  Laterality: N/A;   TONSILLECTOMY AND ADENOIDECTOMY     TRANSESOPHAGEAL ECHOCARDIOGRAM (CATH LAB) N/A 12/08/2023   Procedure: TRANSESOPHAGEAL ECHOCARDIOGRAM;  Surgeon: Raford Riggs, MD;  Location: [facility] INVASIVE CV LAB;  Service: Cardiovascular;  Laterality: N/A;   Social History:  reports that he has never smoked. He has never used smokeless tobacco. He reports that he does not drink alcohol and does not use  drugs.  Family / Support Systems Marital Status: Married How Long?: 53 years Patient Roles: Spouse, Parent Spouse/Significant Other: Wife- Orie Children: 2 children deceased Other Supports: none Anticipated Caregiver: wife Ability/Limitations of Caregiver: Pt wife is primary caregiver; pt needs to be as independent as possible Caregiver Availability: 24/7 Family Dynamics: Pt lives with his wife   Social History Preferred language: English Religion:  Cultural Background: Pt is an Industrial/product Designer. Retired Best Boy: college grad Primary School Teacher - How often do you need to have someone help you when you read instructions, pamphlets, or other written material from your doctor or pharmacy?: Never Writes: Yes Employment Status: Retired Marine Scientist Issues: Denies Guardian/Conservator: PRODUCT MANAGER- wife Training And Development Officer    Abuse/Neglect Abuse/Neglect Assessment Can Be Completed: Yes Physical Abuse: Denies Verbal Abuse: Denies Sexual Abuse: Denies Exploitation of patient/patient's resources: Denies Self-Neglect: Denies   Patient response to: Social Isolation - How often do you feel lonely or isolated from those around you?: Never   Emotional Status Pt's affect, behavior and adjustment status: Pt in good spirits at time of visit Recent Psychosocial Issues: Denies Psychiatric History: Denies Substance Abuse History: Denies   Patient / Family Perceptions, Expectations & Goals Pt/Family understanding of illness & functional limitations: Pt wife has a general understanding of care needs Premorbid pt/family roles/activities: cog assistance; physically ambulatory Anticipated changes in roles/activities/participation: Assistance with ADLs/IADLs Pt/family expectations/goals: Pt would like for his left leg to work; wife would like for him to be as independent as possible.   Community [employer] Agencies: None Premorbid Home Care/DME Agencies: None Transportation  available at discharge:  pt wife Is the patient able to respond to transportation needs?: Yes In the past 12 months, has lack of transportation kept you from medical appointments or from getting medications?: No In the past 12 months, has lack of transportation kept you from meetings, work, or from getting things needed for daily living?: No Resource referrals recommended: Neuropsychology   Discharge Planning Living Arrangements: Spouse/significant other Support Systems: Spouse/significant other Type of Residence: Private residence Insurance Resources: Media Planner (specify) (BCBS Medicare) Surveyor, Quantity Resources: Restaurant Manager, Fast Food Screen Referred: No Living Expenses: Banker Management: Spouse Does the patient have any problems obtaining your medications?: No Home Management: Pt wife managed all home care needs Patient/Family Preliminary Plans: No changes Care Coordinator Barriers to Discharge: Decreased caregiver support, Lack of/limited family support, Insurance for SNF coverage Care Coordinator Anticipated Follow Up Needs: HH/OP Expected length of stay: ELOS 12-21 days   Clinical Impression SW familiar as pt is a return patient. DME: several canes, 2-RWs, 3in1 BSC and shower chair.   [name] [name] [name] 02/05/2024, 8:36 AM

## 2024-02-05 NOTE — Care Management (Signed)
 [facility] Individual Statement of Services  Patient Name:  John [name]  Date:  02/05/2024  Welcome to the [facility]patient [facility].  Our goal is to provide you with an individualized program based on your diagnosis and situation, designed to meet your specific needs.  With this comprehensive rehabilitation program, you will be expected to participate in at least 3 hours of rehabilitation therapies Monday-Friday, with modified therapy programming on the weekends.  Your rehabilitation program will include the following services:  Physical Therapy (PT), Occupational Therapy (OT), Speech Therapy (ST), 24 hour per day rehabilitation nursing, Therapeutic Recreaction (TR), Psychology, Neuropsychology, Care Coordinator, Rehabilitation Medicine, Nutrition Services, Pharmacy Services, and Other  Weekly team conferences will be held on Tuesday to discuss your progress.  Your Inpatient Rehabilitation Care Coordinator will talk with you frequently to get your input and to update you on team discussions.  Team conferences with you and your family in attendance may also be held.  Expected length of stay: 12-21 days    Overall anticipated outcome: Supervision  Depending on your progress and recovery, your program may change. Your Inpatient Rehabilitation Care Coordinator will coordinate services and will keep you informed of any changes. Your Inpatient Rehabilitation Care Coordinator's name and contact numbers are listed  below.  The following services may also be recommended but are not provided by the Inpatient Rehabilitation [facility]:  Driving Evaluations Home Health Rehabiltiation Services Outpatient Rehabilitation Services Vocational Rehabilitation   Arrangements will be made to provide these services after discharge if needed.  Arrangements include referral to agencies that provide these services.  Your insurance has been verified to be:  [employer]  Your primary  doctor is:  Mabel Pry  Pertinent information will be shared with your doctor and your insurance company.  Inpatient Rehabilitation Care Coordinator:  [name], KEN [phone] or [phone]  Information discussed with and copy given to patient by: [name] [name] [name], 02/05/2024, 8:28 AM

## 2024-02-05 NOTE — Progress Notes (Signed)
 PROGRESS NOTE   Subjective/Complaints:  No events overnight.  Slightly hypertensive this a.m., 140s over 60s.  Patient has no complaints today.  PO intake 50-85% Last BM 10/20 --patient confirms this sounds accurate.  Denies any abdominal pain, distention.  Is passing flatus.  ROS: Denies fevers, chills, N/V, abdominal pain, constipation, diarrhea, SOB, cough, chest pain, new weakness or paraesthesias.   +Constipation  Objective:   No results found. Recent Labs    02/04/24 0551  WBC 7.7  HGB 10.6*  HCT 31.2*  PLT 300   Recent Labs    02/04/24 0551  NA 137  K 3.6  CL 102  CO2 25  GLUCOSE 163*  BUN 19  CREATININE 0.82  CALCIUM  8.9    Intake/Output Summary (Last 24 hours) at 02/05/2024 1004 Last data filed at 02/05/2024 0808 Gross per 24 hour  Intake 980 ml  Output 2475 ml  Net -1495 ml        Physical Exam: Vital Signs Blood pressure (!) 147/69, pulse 60, temperature 98.4 F (36.9 C), temperature source Oral, resp. rate 16, height 6' (1.829 m), weight 76.9 kg, SpO2 99%.  Constitutional: No apparent distress. Appropriate appearance for age.  Reclining in chair. HENT: No JVD. Neck Supple. Trachea midline. Atraumatic, normocephalic. Eyes: PERRLA. EOMI. Visual fields grossly intact.  Cardiovascular: RRR, no murmurs/rub/gallops. No Edema. Peripheral pulses 2+  Respiratory: Mild crackles and expiratory wheezes. On RA.  Abdomen: + bowel sounds, normoactive. No distention or tenderness.  GU: Not examined. +Foley, draining clear urine.  Skin: C/D/I. No apparent lesions.  MSK:      No apparent deformity.       Neurologic exam:  Cognition: AAO to person, place, time and event.  Unchanged from prior exams. + Mild to moderate cognitive deficits, difficulty with abstraction  Language: Fluent, No substitutions or neoglisms. No dysarthria.  Insight: Fair insight into current condition.  Mood: Pleasant affect,  appropriate mood.  Sensation: To light touch intact in BL UEs and LEs  Reflexes: 2+ in BL UE and LEs. Negative Hoffman's and babinski signs bilaterally.  CN: 2-12 grossly intact.  Coordination: Mild left upper and lower extremity ataxia Spasticity: MAS 0 in all extremities.        Strength:                RUE: 5/5 SA, 5/5 EF, 5/5 EE, 5/5 WE, 5/5 FF, 5/5 FA                LUE:  5-/5 SA, 5-/5 EF, 5-/5 EE, 5-/5 WE, 5-/5 FF, 5-/5 FA                RLE: 5/5 HF, 5/5 KE, 5/5  DF, 5/5  EHL, 5/5  PF                 LLE:  3/5 HF, 3/5 KE, 4/5  DF, 4/5  EHL, 4/5  PF   Physical exam unchanged from the above on reexamination 02/05/24    Assessment/Plan: 1. Functional deficits which require 3+ hours per day of interdisciplinary therapy in a comprehensive inpatient rehab setting. Physiatrist is providing close team supervision and 24 hour management of  active medical problems listed below. Physiatrist and rehab team continue to assess barriers to discharge/monitor patient progress toward functional and medical goals  Care Tool:  Bathing    Body parts bathed by patient: Right arm, Chest, Left arm, Abdomen, Front perineal area, Face   Body parts bathed by helper: Buttocks, Right upper leg, Left upper leg, Right lower leg, Left lower leg     Bathing assist Assist Level: Maximal Assistance - Patient 24 - 49%     Upper Body Dressing/Undressing Upper body dressing   What is the patient wearing?: Pull over shirt    Upper body assist Assist Level: Moderate Assistance - Patient 50 - 74%    Lower Body Dressing/Undressing Lower body dressing      What is the patient wearing?: Underwear/pull up, Pants     Lower body assist Assist for lower body dressing: Maximal Assistance - Patient 25 - 49%     Toileting Toileting    Toileting assist Assist for toileting: 2 Helpers     Transfers Chair/bed transfer  Transfers assist     Chair/bed transfer assist level: Moderate Assistance - Patient 50  - 74%     Locomotion Ambulation   Ambulation assist      Assist level: 2 helpers Assistive device: Walker-rolling Max distance: 112'   Walk 10 feet activity   Assist     Assist level: 2 helpers Assistive device: Walker-rolling   Walk 50 feet activity   Assist    Assist level: 2 helpers Assistive device: Walker-rolling    Walk 150 feet activity   Assist    Assist level: 2 helpers Assistive device: Walker-rolling    Walk 10 feet on uneven surface  activity   Assist Walk 10 feet on uneven surfaces activity did not occur: Safety/medical concerns         Wheelchair     Assist Is the patient using a wheelchair?: Yes Type of Wheelchair: Manual    Wheelchair assist level: Dependent - Patient 0%      Wheelchair 50 feet with 2 turns activity    Assist        Assist Level: Dependent - Patient 0%   Wheelchair 150 feet activity     Assist      Assist Level: Dependent - Patient 0%   Blood pressure (!) 147/69, pulse 60, temperature 98.4 F (36.9 C), temperature source Oral, resp. rate 16, height 6' (1.829 m), weight 76.9 kg, SpO2 99%.  1. Functional deficits secondary to acute pontine infarct             -patient may  shower             -ELOS/Goals: 9-12 days Min A to supervision              - stable to continue CIR   2.  Antithrombotics: -DVT/anticoagulation:  Pharmaceutical: Lovenox              -antiplatelet therapy: Aspirin  and  Plavix  until 10/28 then aspirin  alone.   3. Pain Management: Oxycodone  and Tylenol  as needed--well-controlled   4. Mood/Behavior/Sleep: LCSW to follow for evaluation and support when available.              -antipsychotic agents: Lexapro  10 mg daily - Delirium : Seroquel  25 mg nightly--tolerating well   5. Neuropsych/cognition: This patient may be intermittently capable of making decisions on their own behalf.   6. Skin/Wound Care: Routine pressure relief measures             -  Stage 2 Sacral  wound--wound care orders placed--Gerhardt's butt cream +Zinc  PO   7. Fluids/Electrolytes/Nutrition: Monitor intake and output.  CBC/CMET in a.m.             - SLP consult             -GERD: Continue PPI   -10-25: Albumin 2.4.  Encourage p.o. intakes.   8.  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  Atorvastatin  80 mg daily.   9.  Acute metabolic encephalopathy: Secondary to aspiration pneumonia and hypotension.  Mentation improved continue to monitor.   10. Hypomagnesia: Mg 1.4<2.0<1.5< now 1.6 monitor and replace.    11.  Bladder outlet obstruction: Chronic foley catheter last changed 10/18             -continue CHG and foley catheter care.  - f/u with urology outpatient.    12.  HTN: resumed home Losartan  25 mg daily, monitor BP with increased activity for hypotension    - 10-26: BP slightly elevated this a.m., back down this afternoon.  Monitor with current  13.  T2DM: A1c 8.5% on 10/7  -monitor cbgs ac/hs with Novolog  SSI and Lantus  15 units  -10-26: Blood sugars consistently elevated, increase Semglee  to 20 units daily Recent Labs    02/04/24 1643 02/04/24 2148 02/05/24 0816  GLUCAP 242* 196* 161*     14.CAD/HLD: Atorvastatin    15.  Severe sepsis due to aspiration RLL PNA: Initially treated with IV meropenem now transition to p.o. Augmentin  ends 10/24.  ID consulted, Dr. Fleeta Rothman recommended exchanging Foley due to fungemia.             - Aspiration precautions             - Palliative care consult: Patient remains DNR limited   16. Cholelithiasis: Maybe chronic, no current GI symptoms. Continue to monitor.    17.  Heart block: S/p PPM    - Heart rate well-controlled, monitor    02/05/2024    6:28 AM 02/04/2024    9:57 PM 02/04/2024    3:13 PM  Vitals with BMI  Systolic 147 144 881  Diastolic 69 65 51  Pulse 60 75 66    18.  Constipation.  Last bowel movement 10-20.  - Start Senokot S1 tab twice daily   - Miralax   PRN--10-26 add to daily  - 10-26: Add as needed sorbitol ; would give if no bowel movement tonight  LOS: 2 days A FACE TO FACE EVALUATION WAS PERFORMED  John [name] 02/05/2024, 10:04 AM

## 2024-02-06 DIAGNOSIS — R410 Disorientation, unspecified: Secondary | ICD-10-CM | POA: Diagnosis not present

## 2024-02-06 DIAGNOSIS — E1169 Type 2 diabetes mellitus with other specified complication: Secondary | ICD-10-CM | POA: Diagnosis not present

## 2024-02-06 DIAGNOSIS — I10 Essential (primary) hypertension: Secondary | ICD-10-CM

## 2024-02-06 DIAGNOSIS — K5900 Constipation, unspecified: Secondary | ICD-10-CM | POA: Diagnosis not present

## 2024-02-06 DIAGNOSIS — Z794 Long term (current) use of insulin: Secondary | ICD-10-CM

## 2024-02-06 DIAGNOSIS — I639 Cerebral infarction, unspecified: Secondary | ICD-10-CM | POA: Diagnosis not present

## 2024-02-06 LAB — BASIC METABOLIC PANEL WITH GFR
Anion gap: 11 (ref 5–15)
BUN: 16 mg/dL (ref 8–23)
CO2: 25 mmol/L (ref 22–32)
Calcium: 8.9 mg/dL (ref 8.9–10.3)
Chloride: 102 mmol/L (ref 98–111)
Creatinine, Ser: 0.92 mg/dL (ref 0.61–1.24)
GFR, Estimated: >60 mL/min
Glucose, Bld: 209 mg/dL — ABNORMAL HIGH (ref 70–99)
Potassium: 3.7 mmol/L (ref 3.5–5.1)
Sodium: 138 mmol/L (ref 135–145)

## 2024-02-06 LAB — CBC
HCT: 31.9 % — ABNORMAL LOW (ref 39.0–52.0)
Hemoglobin: 10.8 g/dL — ABNORMAL LOW (ref 13.0–17.0)
MCH: 30.2 pg (ref 26.0–34.0)
MCHC: 33.9 g/dL (ref 30.0–36.0)
MCV: 89.1 fL (ref 80.0–100.0)
Platelets: 325 K/uL (ref 150–400)
RBC: 3.58 MIL/uL — ABNORMAL LOW (ref 4.22–5.81)
RDW: 16.8 % — ABNORMAL HIGH (ref 11.5–15.5)
WBC: 7.8 K/uL (ref 4.0–10.5)
nRBC: 0 % (ref 0.0–0.2)

## 2024-02-06 LAB — GLUCOSE, CAPILLARY
Glucose-Capillary: 183 mg/dL — ABNORMAL HIGH (ref 70–99)
Glucose-Capillary: 252 mg/dL — ABNORMAL HIGH (ref 70–99)
Glucose-Capillary: 184 mg/dL — ABNORMAL HIGH (ref 70–99)
Glucose-Capillary: 199 mg/dL — ABNORMAL HIGH (ref 70–99)

## 2024-02-06 MED ORDER — INSULIN GLARGINE-YFGN 100 UNIT/ML ~~LOC~~ SOLN
23.0000 [IU] | Freq: Every day | SUBCUTANEOUS | Status: DC
  Filled 2024-02-06: qty 0.23

## 2024-02-06 NOTE — Progress Notes (Signed)
 Occupational Therapy Session Note  Patient Details  Name: John [name] MRN: [id number] Date of Birth: [birth year]  Today's Date: 02/06/2024 OT Individual Time: 1050-1200 OT Individual Time Calculation (min): 70 min    Short Term Goals: Week 1:  OT Short Term Goal 1 (Week 1): Pt will complete LB dressing at Max A with AE as necessary OT Short Term Goal 2 (Week 1): Pt will complete toilet transfer at Max A with LRAD OT Short Term Goal 3 (Week 1): Pt will complete UB dressing with min A OT Short Term Goal 4 (Week 1): Pt will demo improved awareness to deficits by identifying one deficit with mod vc  Skilled Therapeutic Interventions/Progress Updates:  Pt greeted resting in bed, unaware LLE was off of bed, skilled OT session with focus on BADL retraining and functional transfers. Gentle music played in background for arousal.   Pain: Pt does not verbalize presence of pain, however grimacing intermediately during bed mobility. OT offering intermediate rest breaks and positioning suggestions throughout session to address pain/fatigue and maximize participation/safety in session.   Functional Transfers: Lateral rolling with Min A. Supine>sit EOB with Min A for trunk elevation. Stand-step transfer from EOB>TIS WC with Mod A + RW.   Self Care Tasks: Pt found with incontinent BM, requiring Max A for care and LB dressing at bed-level. Pt able to bridge to assist with care with OT placing LLE. Pt dons shirt with Min A for sitting balance. Sink-side grooming with setup, cuing for L lateral lean correction.   Therapeutic Exercise: Pt instructed in SciFit modality for BUE strengthening (LUE gentle ROM). Pt tolerates ~10 minutes before terminating task, all at level 1 resistance.   Pt remained sitting in TIS WC with 4Ps assessed and immediate needs met. Pt continues to be appropriate for skilled OT intervention to promote further functional independence in ADLs/IADLs.   Therapy  Documentation Precautions:  Precautions Precautions: Fall Precaution Booklet Issued: No Recall of Precautions/Restrictions: Impaired Precaution/Restrictions Comments: L hemiparesis Restrictions Weight Bearing Restrictions Per Provider Order: No   Therapy/Group: Individual Therapy  [name], OTR/L, MSOT  02/06/2024, 7:51 AM

## 2024-02-06 NOTE — Plan of Care (Signed)
 Problem: Consults Goal: RH GENERAL PATIENT EDUCATION Description: See Patient Education module for education specifics. Outcome: Progressing   Problem: RH BOWEL ELIMINATION Goal: RH STG MANAGE BOWEL WITH ASSISTANCE Description: STG Manage Bowel with supervision Assistance. Outcome: Progressing   Problem: RH BLADDER ELIMINATION Goal: RH STG MANAGE BLADDER WITH ASSISTANCE Description: STG Manage Bladder With supervision  Assistance Outcome: Progressing   Problem: RH SKIN INTEGRITY Goal: RH STG SKIN FREE OF INFECTION/BREAKDOWN Description: Manage skin free of infection with supervision assistance Outcome: Progressing   Problem: RH SAFETY Goal: RH STG ADHERE TO SAFETY PRECAUTIONS W/ASSISTANCE/DEVICE Description: STG Adhere to Safety Precautions With supervision  Assistance/Device. Outcome: Progressing   Problem: RH PAIN MANAGEMENT Goal: RH STG PAIN MANAGED AT OR BELOW PT'S PAIN GOAL Description: <4 w/ prns Outcome: Progressing   Problem: RH KNOWLEDGE DEFICIT GENERAL Goal: RH STG INCREASE KNOWLEDGE OF SELF CARE AFTER HOSPITALIZATION Description: Manage increase  knowledge of self care after hospitalization with supervision assistance from wife using educational materials provided Outcome: Progressing

## 2024-02-06 NOTE — Progress Notes (Signed)
 Inpatient Rehabilitation  Patient information reviewed and entered into eRehab system by [facility]. Karen Kays., CCC/SLP, PPS Coordinator.  Information including medical coding, functional ability and quality indicators will be reviewed and updated through discharge.

## 2024-02-06 NOTE — IPOC Note (Signed)
 Overall Plan of Care [facility]) Patient Details Name: John [name] MRN: [id number] DOB: November 19, 1945  Admitting Diagnosis: CVA (cerebral vascular accident) [facility])  Hospital Problems: Principal Problem:   CVA (cerebral vascular accident) [facility])     Functional Problem List: Nursing Bladder, Bowel, Edema, Endurance, Medication Management, Pain, Safety, Skin Integrity  PT Behavior, Balance, Motor, Pain, Perception, Safety, Sensory, Endurance  OT Balance, Behavior, Safety, Cognition, Endurance, Motor, Sensory  SLP Cognition, Safety  TR         Basic ADL's: OT Bathing, Dressing, Toileting     Advanced  ADL's: OT None     Transfers: PT Bed Mobility, Bed to Chair, Customer Service Manager, Tub/Shower     Locomotion: PT Ambulation, Stairs     Additional Impairments: OT Fuctional Use of Upper Extremity  SLP Social Cognition   Problem Solving, Memory, Attention, Awareness  TR      Anticipated Outcomes Item Anticipated Outcome  Self Feeding no goal  Swallowing      Basic self-care  min A  Toileting  min A   Bathroom Transfers min A  Bowel/Bladder  manage bowel continence/ manage bladder with medications toileting assistance  Transfers  supervision  Locomotion  supervision  Communication     Cognition  Mod A  Pain  <4 w/ prns  Safety/Judgment  manage safety with supervision assistance   Therapy Plan: PT Intensity: Minimum of 1-2 x/day ,45 to 90 minutes PT Frequency: 5 out of 7 days PT Duration Estimated Length of Stay: 12-14 days OT Intensity: Minimum of 1-2 x/day, 45 to 90 minutes OT Frequency: 5 out of 7 days OT Duration/Estimated Length of Stay: 2-3 weeks SLP Intensity: Minumum of 1-2 x/day, 30 to 90 minutes SLP Frequency: 3 to 5 out of 7 days SLP Duration/Estimated Length of Stay: 2-3 weeks   Team Interventions: Nursing Interventions Patient/Family Education, Medication Management, Bladder Management, Bowel Management, Disease Management/Prevention, Pain  Management, Discharge Planning, Skin Care/Wound Management  PT interventions Ambulation/gait training, Community reintegration, DME/adaptive equipment instruction, Neuromuscular re-education, Psychosocial support, Stair training, UE/LE Strength taining/ROM, Wheelchair propulsion/positioning, UE/LE Coordination activities, Therapeutic Activities, Skin care/wound management, Pain management, Functional electrical stimulation, Discharge planning, Warden/ranger, Cognitive remediation/compensation, Disease management/prevention, Functional mobility training, Patient/family education, Splinting/orthotics, Therapeutic Exercise  OT Interventions Balance/vestibular training, Discharge planning, Functional electrical stimulation, Pain management, Self Care/advanced ADL retraining, Therapeutic Activities, UE/LE Coordination activities, Cognitive remediation/compensation, Disease mangement/prevention, Functional mobility training, Patient/family education, Therapeutic Exercise, Skin care/wound managment, Visual/perceptual remediation/compensation, Firefighter, Fish Farm Manager, Neuromuscular re-education, Psychosocial support, Splinting/orthotics, UE/LE Strength taining/ROM, Wheelchair propulsion/positioning  SLP Interventions Cognitive remediation/compensation, Environmental controls, Financial trader, Therapeutic Activities, Functional tasks, Internal/external aids, Therapeutic Exercise, Patient/family education  TR Interventions    SW/CM Interventions Patient/Family Education, Psychosocial Support, Discharge Planning   Barriers to Discharge MD  Medical stability and Behavior  Nursing Decreased caregiver support, Home environment access/layout, Incontinence Discharge Home Layout: Able to live on main level with bedroom/bathroom (has bed in living room)  Discharge Home Access: Stairs to enter  Entrance Stairs-Rails: Can reach both (on stairs in back)  Entrance Stairs-Number  of Steps: 4  PT Inaccessible home environment, Decreased caregiver support, Home environment access/layout lives with wife, unsure of physical assist pt wife can provide, 4 STE BHRs  OT Behavior pt often reluctant/resistant to rehab efforts  SLP      SW Decreased caregiver support, Insurance for SNF coverage, Lack of/limited family support     Team Discharge Planning: Destination: PT-Home ,OT- Home , SLP-Home Projected Follow-up: PT-Home health  PT, OT-  Home health OT, 24 hour supervision/assistance, SLP-24 hour supervision/assistance, Home Health SLP, Outpatient SLP Projected Equipment Needs: PT-To be determined, OT- 3 in 1 bedside comode, SLP-None recommended by SLP Equipment Details: PT- , OT-  Patient/family involved in discharge planning: PT- Patient,  OT-Patient, SLP-Patient  MD ELOS: 14-16 Medical Rehab Prognosis:  Excellent Assessment: The patient has been admitted for CIR therapies with the diagnosis of acute pontine infarct . The team will be addressing functional mobility, strength, stamina, balance, safety, adaptive techniques and equipment, self-care, bowel and bladder mgt, patient and caregiver education. Goals have been set at min A/Sup. Anticipated discharge destination is home.        See Team Conference Notes for weekly updates to the plan of care

## 2024-02-06 NOTE — Progress Notes (Signed)
 Physical Therapy Session Note  Patient Details  Name: John [name] MRN: [id number] Date of Birth: 20-Jan-1946  Today's Date: 02/06/2024 PT Individual Time: 1345-1500 PT Individual Time Calculation (min): 75 min   Short Term Goals: Week 1:  PT Short Term Goal 1 (Week 1): Pt will perform supine<->sit with CGA PT Short Term Goal 2 (Week 1): Pt will ambulate 150' with RW min assist PT Short Term Goal 3 (Week 1): Pt will complete sit to stand with min assist consistently PT Short Term Goal 4 (Week 1): Pt will complete up/down 4 steps BHRs with CGA  Skilled Therapeutic Interventions/Progress Updates:      Pt reclined in TIS w/c to start - agreeable to therapy treatment. Pt has no reports of pain. Continues to be confused and tangential, but redirectable as needed.   Transported patient to the main gym at w/c level. Sit<>stand to RW with minA for powering to rise and cues for awareness of his LLE to ensure wide BOS.   130' minA with RW 260' minA with RW *cues during gait for upright posture, forward gaze, and keeping body within walker frame during turns. Pt with soft L knee buckling as he becomes fatigued, has decreased generalized awareness of his environment (running into obstacles, chairs, etc).   2x4 6 stairs with 2 hand rails with minA with a seated rest break b/w efforts. Completes with a step-to pattern for both directions. Cues throughout for general safety awareness, to reduce overcrowding during descent by widening L step placement, and pacing himself for fall prevention.   Car transfer with minA and RW - pt needing minA to lift his LLE into and out of the vehicle due to elevated floor height.  Returned to his room and he requested to lie down in bed to rest.  Ended session in bed with alarm on, needs met.   Therapy Documentation Precautions:  Precautions Precautions: Fall Precaution Booklet Issued: No Recall of Precautions/Restrictions:  Impaired Precaution/Restrictions Comments: L hemiparesis Restrictions Weight Bearing Restrictions Per Provider Order: No General:      Therapy/Group: Individual Therapy  John [name] 02/06/2024, 12:35 PM

## 2024-02-06 NOTE — Progress Notes (Signed)
 Speech Language Pathology Daily Session Note  Patient Details  Name: John [name] MRN: [id number] Date of Birth: 01-Aug-1945  Today's Date: 02/06/2024 SLP Individual Time: 0900-1000 SLP Individual Time Calculation (min): 60 min  Short Term Goals: Week 1: SLP Short Term Goal 1 (Week 1): Patient will utilize WRAP memory strategies to recall daily information given maxA SLP Short Term Goal 2 (Week 1): During speech therapy tasks, patient will sustain attention for intervals given maxA SLP Short Term Goal 3 (Week 1): Patient will orient to time using external aids given modA SLP Short Term Goal 4 (Week 1): Patient will state 2 cognitive deficits given maxA  Skilled Therapeutic Interventions: Pt greeted at bedside for tx targeting cognition. Pt immediately yelled for SLP to Get out! And Scram! Because you're a woman and I'm a man. He required education re SLPs role and tx goals. He was eventually, begrudgingly, agreeable to verbal tx tasks. He immediately required orientation review d/t report that he'd slept past noon. Current time provided via errorless learning and window shades were opened to assist w/ AM vs PM regulation. He was within 2 days of current date. Errorless learning required for orientation to place and reason for stay. Nursing arrived to provide medications and he required modA for problem solving and reasoning. He attempted to use humor to cover cognitive deficits throughout and remained short w/ staff. He required maxA to complete verbal task targeting working memory and problem solving but was only able to complete 3 trials before task was discontinued d/t lack of pt participation. At the end of tx tasks, he was left in his bed w/ the alarm set and call light within reach. Recommend cont ST per POC.   Pain  None reported  Therapy/Group: Individual Therapy  [name] 02/06/2024, 9:20 AM

## 2024-02-06 NOTE — Progress Notes (Addendum)
 PROGRESS NOTE   Subjective/Complaints: Pt reports he feels well overall. Reports he his happy his catheter is out (cath appears in place).   ROS: Denies fevers, chills, N/V, abdominal pain, nausea, constipation, diarrhea, SOB, cough, chest pain, new weakness or paraesthesias.   +Constipation  Objective:   No results found. Recent Labs    02/04/24 0551 02/06/24 0515  WBC 7.7 7.8  HGB 10.6* 10.8*  HCT 31.2* 31.9*  PLT 300 325   Recent Labs    02/04/24 0551 02/06/24 0515  NA 137 138  K 3.6 3.7  CL 102 102  CO2 25 25  GLUCOSE 163* 209*  BUN 19 16  CREATININE 0.82 0.92  CALCIUM  8.9 8.9    Intake/Output Summary (Last 24 hours) at 02/06/2024 1026 Last data filed at 02/06/2024 0444 Gross per 24 hour  Intake 910 ml  Output 1025 ml  Net -115 ml        Physical Exam: Vital Signs Blood pressure (!) 148/70, pulse 74, temperature 98.5 F (36.9 C), resp. rate 17, height 6' (1.829 m), weight 76.9 kg, SpO2 96%.  Constitutional: No apparent distress. Appropriate appearance for age.  Laying in bed HENT: No JVD. Neck Supple. Trachea midline. Atraumatic, normocephalic. Eyes: PERRLA. EOMI. Visual fields grossly intact.  Cardiovascular: RRR, no murmurs/rub/gallops. No Edema. Peripheral pulses 2+  Respiratory: Mild crackles and expiratory wheezes. On RA.  Abdomen: + bowel sounds, normoactive. No distention or tenderness.  GU: Not examined. +Foley, draining clear urine.  Skin: C/D/I. No apparent lesions.  MSK:      No apparent deformity.       Neurologic exam:  Cognition: AAO to person only + Mild to moderate cognitive deficits, difficulty with abstraction  Language: Fluent, No substitutions or neoglisms. No dysarthria.  Insight: Fair insight into current condition.  Mood: Pleasant affect, appropriate mood.  Sensation: To light touch intact in BL UEs and LEs  Reflexes: 2+ in BL UE and LEs. Negative Hoffman's and  babinski signs bilaterally.  CN: 2-12 grossly intact.  Coordination: Mild left upper and lower extremity ataxia Spasticity: MAS 0 in all extremities.        Strength:                RUE: 5/5 SA, 5/5 EF, 5/5 EE, 5/5 WE, 5/5 FF, 5/5 FA                LUE:  5-/5 SA, 5-/5 EF, 5-/5 EE, 5-/5 WE, 5-/5 FF, 5-/5 FA                RLE: 5/5 HF, 5/5 KE, 5/5  DF, 5/5  EHL, 5/5  PF                 LLE:  3/5 HF, 3/5 KE, 4/5  DF, 4/5  EHL, 4/5  PF    Assessment/Plan: 1. Functional deficits which require 3+ hours per day of interdisciplinary therapy in a comprehensive inpatient rehab setting. Physiatrist is providing close team supervision and 24 hour management of active medical problems listed below. Physiatrist and rehab team continue to assess barriers to discharge/monitor patient progress toward functional and medical goals  Care Tool:  Bathing    Body parts bathed by patient: Right arm, Chest, Left arm, Abdomen, Front perineal area, Face   Body parts bathed by helper: Buttocks, Right upper leg, Left upper leg, Right lower leg, Left lower leg     Bathing assist Assist Level: Maximal Assistance - Patient 24 - 49%     Upper Body Dressing/Undressing Upper body dressing   What is the patient wearing?: Pull over shirt    Upper body assist Assist Level: Moderate Assistance - Patient 50 - 74%    Lower Body Dressing/Undressing Lower body dressing      What is the patient wearing?: Underwear/pull up, Pants     Lower body assist Assist for lower body dressing: Maximal Assistance - Patient 25 - 49%     Toileting Toileting    Toileting assist Assist for toileting: 2 Helpers     Transfers Chair/bed transfer  Transfers assist     Chair/bed transfer assist level: Moderate Assistance - Patient 50 - 74%     Locomotion Ambulation   Ambulation assist      Assist level: 2 helpers Assistive device: Walker-rolling Max distance: 112'   Walk 10 feet activity   Assist      Assist level: 2 helpers Assistive device: Walker-rolling   Walk 50 feet activity   Assist    Assist level: 2 helpers Assistive device: Walker-rolling    Walk 150 feet activity   Assist Walk 150 feet activity did not occur: Safety/medical concerns (Max distance 112')         Walk 10 feet on uneven surface  activity   Assist Walk 10 feet on uneven surfaces activity did not occur: Safety/medical concerns         Wheelchair     Assist Is the patient using a wheelchair?: Yes Type of Wheelchair: Manual    Wheelchair assist level: Dependent - Patient 0%      Wheelchair 50 feet with 2 turns activity    Assist        Assist Level: Dependent - Patient 0%   Wheelchair 150 feet activity     Assist      Assist Level: Dependent - Patient 0%   Blood pressure (!) 148/70, pulse 74, temperature 98.5 F (36.9 C), resp. rate 17, height 6' (1.829 m), weight 76.9 kg, SpO2 96%.  1. Functional deficits secondary to acute pontine infarct             -patient may  shower             -ELOS/Goals: 9-12 days Min A to supervision              - stable to continue CIR  -Will recheck U/A/culture, does not appear as oriented this AM as prior notes, Discussed with therapy- not felt to be off his usual baseline as he waxes and waves, nursing reports oriented x3 later in day, continue to monitor    2.  Antithrombotics: -DVT/anticoagulation:  Pharmaceutical: Lovenox              -antiplatelet therapy: Aspirin  and  Plavix  until 10/28 then aspirin  alone.   3. Pain Management: Oxycodone  and Tylenol  as needed--well-controlled   4. Mood/Behavior/Sleep: LCSW to follow for evaluation and support when available.              -antipsychotic agents: Lexapro  10 mg daily - Delirium : Seroquel  25 mg nightly--tolerating well -Appears more confused today, check U/A, continue seroquel  at night   5. Neuropsych/cognition: This  patient may be intermittently capable of making  decisions on their own behalf.   6. Skin/Wound Care: Routine pressure relief measures             -Stage 2 Sacral wound--wound care orders placed--Gerhardt's butt cream +Zinc  PO   7. Fluids/Electrolytes/Nutrition: Monitor intake and output.  CBC/CMET in a.m.             - SLP consult             -GERD: Continue PPI   -10-25: Albumin 2.4.  Encourage p.o. intakes.   8.  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  Atorvastatin  80 mg daily.   9.  Acute metabolic encephalopathy: Secondary to aspiration pneumonia and hypotension.  Mentation improved continue to monitor.   10. Hypomagnesia: Mg 1.4<2.0<1.5< now 1.6 monitor and replace.    11.  Bladder outlet obstruction: Chronic foley catheter last changed 10/18             -continue CHG and foley catheter care.  - f/u with urology outpatient.    12.  HTN: resumed home Losartan  25 mg daily, monitor BP with increased activity for hypotension    - 10-26: BP slightly elevated this a.m., back down this afternoon.  Monitor with current  -10/27 BP controlled, overall, continue current regimen   13.  T2DM: A1c 8.5% on 10/7  -monitor cbgs ac/hs with Novolog  SSI and Lantus  15 units  -10-26: Blood sugars consistently elevated, increase Semglee  to 20 units daily -10/27 increase semglee  to 23 units Recent Labs    02/05/24 2103 02/06/24 0654 02/06/24 1202  GLUCAP 276* 183* 252*     14.CAD/HLD: Atorvastatin    15.  Severe sepsis due to aspiration RLL PNA: Initially treated with IV meropenem now transition to p.o. Augmentin  ends 10/24.  ID consulted, Dr. Fleeta Rothman recommended exchanging Foley due to fungemia.             - Aspiration precautions             - Palliative care consult: Patient remains DNR limited   16. Cholelithiasis: Maybe chronic, no current GI symptoms. Continue to monitor.    17.  Heart block: S/p PPM    - Heart rate well-controlled, monitor    02/06/2024    4:40 AM 02/05/2024     9:05 PM 02/05/2024    1:29 PM  Vitals with BMI  Systolic 148 127 879  Diastolic 70 58 53  Pulse 74 66 62    18.  Constipation.  Last bowel movement 10-20.  - Start Senokot S1 tab twice daily   - Miralax  PRN--10-26 add to daily  - 10-26: Add as needed sorbitol ; would give if no bowel movement tonight  -10/27 LBM today, continue to monitor LOS: 3 days A FACE TO FACE EVALUATION WAS PERFORMED  Murray Collier 02/06/2024, 10:26 AM

## 2024-02-06 NOTE — Progress Notes (Signed)
 Patient ID: John [name], male   DOB: 1945/09/22, 78 y.o.   MRN: [id number]  1211- SW called pt wife to inform on ELOS and will follow-up with updates after team conference.   [name], MSW, LCSW Office: [phone] Cell: [phone] Fax: [phone]

## 2024-02-07 DIAGNOSIS — I639 Cerebral infarction, unspecified: Secondary | ICD-10-CM | POA: Diagnosis not present

## 2024-02-07 LAB — GLUCOSE, CAPILLARY
Glucose-Capillary: 163 mg/dL — ABNORMAL HIGH (ref 70–99)
Glucose-Capillary: 256 mg/dL — ABNORMAL HIGH (ref 70–99)
Glucose-Capillary: 258 mg/dL — ABNORMAL HIGH (ref 70–99)
Glucose-Capillary: 205 mg/dL — ABNORMAL HIGH (ref 70–99)

## 2024-02-07 LAB — URINE CULTURE: Culture: NO GROWTH

## 2024-02-07 MED ORDER — MIRTAZAPINE 15 MG PO TBDP
15.0000 mg | ORAL_TABLET | Freq: Every day | ORAL | Status: DC
  Administered 2024-02-07 – 2024-02-08 (×2): 15 mg via ORAL
  Filled 2024-02-07 (×2): qty 1

## 2024-02-07 MED ORDER — GERHARDT'S BUTT CREAM
TOPICAL_CREAM | Freq: Four times a day (QID) | CUTANEOUS | Status: DC
  Filled 2024-02-07: qty 60

## 2024-02-07 MED ORDER — MELATONIN 5 MG PO TABS: 5.0000 mg | ORAL_TABLET | Freq: Every evening | ORAL | Status: DC | PRN

## 2024-02-07 MED ORDER — MELATONIN 5 MG PO TABS: 5.0000 mg | ORAL_TABLET | Freq: Every day | ORAL | Status: DC

## 2024-02-07 MED ORDER — GERHARDT'S BUTT CREAM: TOPICAL_CREAM | CUTANEOUS | Status: DC | PRN

## 2024-02-07 MED ORDER — QUETIAPINE FUMARATE 25 MG PO TABS: 25.0000 mg | ORAL_TABLET | Freq: Every evening | ORAL | Status: DC | PRN

## 2024-02-07 MED ORDER — INSULIN GLARGINE-YFGN 100 UNIT/ML ~~LOC~~ SOLN
23.0000 [IU] | Freq: Every day | SUBCUTANEOUS | Status: DC
  Administered 2024-02-07 – 2024-02-13 (×7): 23 [IU] via SUBCUTANEOUS
  Filled 2024-02-07 (×8): qty 0.23

## 2024-02-07 NOTE — Progress Notes (Signed)
 Occupational Therapy Session Note  Patient Details  Name: John [name] MRN: [id number] Date of Birth: [birth year]  Today's Date: 02/07/2024 OT Individual Time: 9094-9074 OT Individual Time Calculation (min): 20 min   Today's Date: 02/07/2024 OT Individual Time: 8594-8574 OT Individual Time Calculation (min): 20 min  and Today's Date: 02/07/2024 OT Missed Time: 20 Minutes Missed Time Reason: Patient unwilling/refused to participate without medical reason   Short Term Goals: Week 1:  OT Short Term Goal 1 (Week 1): Pt will complete LB dressing at Max A with AE as necessary OT Short Term Goal 2 (Week 1): Pt will complete toilet transfer at Max A with LRAD OT Short Term Goal 3 (Week 1): Pt will complete UB dressing with min A OT Short Term Goal 4 (Week 1): Pt will demo improved awareness to deficits by identifying one deficit with mod vc  Skilled Therapeutic Interventions/Progress Updates:   Session 1: Pt greeted resting in bed, increased cuing required for attempted at arousal. OT playing gentle music in the background. Pt clearly states I am not awake yet thereafter seemingly refusing to follow any cues provided, keeping eyes closed and providing no other verbalization. Total A provided for posterior care (post BM) including bed mobility as patient does not incite volitional movement. Pt's bed placed in chair position, music and light on, all to increase orientation to time of day. Breakfast tray setup in front of patient. Pt remained resting in bed with call bell within reach.   Pain: Pt does not verbalize presence of pain. OT offering intermediate rest breaks and positioning suggestions throughout session to potential address pain/fatigue and maximize participation/safety in session.   Session 2:  Pt greeted resting in bed. Does not verbalize during session, intermediate grimacing with unknown cause. Pt shakes head no when presented with therapeutic activity. Pt tolerates OT  dependently slidding legs across bed to initiate mobility, but provides no effort to elevate trunk. OT attempts to engage with multiple food items as patient's meal tray was placed outside of reach. No success within session. Patient made intermediate supervision for meals, sign placed outside door, LPN updated. Pt missing ~20 mins of skilled intervention due care refusal. Pt remained resting in bed, quality care coordinator present, 4Ps assessed and immediate needs met. Pt continues to be appropriate for skilled OT intervention to promote further functional independence in ADLs/IADLs.   Therapy Documentation Precautions:  Precautions Precautions: Fall Precaution Booklet Issued: No Recall of Precautions/Restrictions: Impaired Precaution/Restrictions Comments: L hemiparesis Restrictions Weight Bearing Restrictions Per Provider Order: No   Therapy/Group: Individual Therapy  [name], OTR/L, MSOT  02/07/2024, 8:00 AM

## 2024-02-07 NOTE — Progress Notes (Signed)
 PROGRESS NOTE   Subjective/Complaints:  No events overnight.  Per nursing, not sleeping very well. Vitals stable overnight; on evaluation, patient with SLP, got orthostatic with systolic blood pressure in the 80s and needed steady to lay flat.  With symptomatic, felt better once lying down.  Denied any other systemic symptoms.     02/07/2024    5:06 AM 02/06/2024    9:36 PM 02/06/2024    4:46 PM  Vitals with BMI  Systolic 152 130 863  Diastolic 62 68 62  Pulse 60 77 63   BG elevated  Recent Labs    02/06/24 1644 02/06/24 2135 02/07/24 0621  GLUCAP 184* 199* 163*     P.o. intakes appropriate  +foley Last BM 10/27 , medium  ROS: Denies fevers, chills, N/V, abdominal pain, nausea, constipation, diarrhea, SOB, cough, chest pain, new weakness or paraesthesias.     Objective:   No results found. Recent Labs    02/06/24 0515  WBC 7.8  HGB 10.8*  HCT 31.9*  PLT 325   Recent Labs    02/06/24 0515  NA 138  K 3.7  CL 102  CO2 25  GLUCOSE 209*  BUN 16  CREATININE 0.92  CALCIUM  8.9    Intake/Output Summary (Last 24 hours) at 02/07/2024 0933 Last data filed at 02/07/2024 0900 Gross per 24 hour  Intake 200 ml  Output 1600 ml  Net -1400 ml        Physical Exam: Vital Signs Blood pressure (!) 152/62, pulse 60, temperature 98.5 F (36.9 C), resp. rate 17, height 6' (1.829 m), weight 76.9 kg, SpO2 98%.  Constitutional: No apparent distress. Appropriate appearance for age.  Being transferred to bed after orthostatic episode. HENT: No JVD. Neck Supple. Trachea midline. Atraumatic, normocephalic. Eyes: PERRLA. EOMI. Visual fields grossly intact.  Cardiovascular: RRR, no murmurs/rub/gallops. No Edema. Peripheral pulses 2+  Respiratory: Mild crackles and expiratory wheezes. On RA.  Abdomen: + bowel sounds, normoactive. No distention or tenderness.  GU: Not examined. +Foley, draining clear urine.  Skin:  C/D/I. No apparent lesions.  MSK:      No apparent deformity.       Neurologic exam:  Cognition: AAO to person, place, and partially to time. + Mild to moderate cognitive deficits, difficulty with abstraction--joking answers to cover deficits  Language: Fluent, No substitutions or neoglisms. No dysarthria.  Insight: Fair insight into current condition.  Mood: Pleasant affect, appropriate mood.  Sensation: To light touch intact in BL UEs and LEs  Reflexes: 2+ in BL UE and LEs. Negative Hoffman's and babinski signs bilaterally.  CN: 2-12 grossly intact.  Coordination: Mild left upper and lower extremity ataxia Spasticity: MAS 0 in all extremities.        Strength: No appreciable change 10-28                RUE: 5/5 SA, 5/5 EF, 5/5 EE, 5/5 WE, 5/5 FF, 5/5 FA                LUE:  5-/5 SA, 5-/5 EF, 5-/5 EE, 5-/5 WE, 5-/5 FF, 5-/5 FA  RLE: 5/5 HF, 5/5 KE, 5/5  DF, 5/5  EHL, 5/5  PF                 LLE:  3/5 HF, 3/5 KE, 4/5  DF, 4/5  EHL, 4/5  PF    Assessment/Plan: 1. Functional deficits which require 3+ hours per day of interdisciplinary therapy in a comprehensive inpatient rehab setting. Physiatrist is providing close team supervision and 24 hour management of active medical problems listed below. Physiatrist and rehab team continue to assess barriers to discharge/monitor patient progress toward functional and medical goals  Care Tool:  Bathing    Body parts bathed by patient: Right arm, Chest, Left arm, Abdomen, Front perineal area, Face   Body parts bathed by helper: Buttocks, Right upper leg, Left upper leg, Right lower leg, Left lower leg     Bathing assist Assist Level: Maximal Assistance - Patient 24 - 49%     Upper Body Dressing/Undressing Upper body dressing   What is the patient wearing?: Pull over shirt    Upper body assist Assist Level: Moderate Assistance - Patient 50 - 74%    Lower Body Dressing/Undressing Lower body dressing      What is the  patient wearing?: Underwear/pull up, Pants     Lower body assist Assist for lower body dressing: Maximal Assistance - Patient 25 - 49%     Toileting Toileting    Toileting assist Assist for toileting: 2 Helpers     Transfers Chair/bed transfer  Transfers assist     Chair/bed transfer assist level: Moderate Assistance - Patient 50 - 74%     Locomotion Ambulation   Ambulation assist      Assist level: 2 helpers Assistive device: Walker-rolling Max distance: 112'   Walk 10 feet activity   Assist     Assist level: 2 helpers Assistive device: Walker-rolling   Walk 50 feet activity   Assist    Assist level: 2 helpers Assistive device: Walker-rolling    Walk 150 feet activity   Assist Walk 150 feet activity did not occur: Safety/medical concerns (Max distance 112')         Walk 10 feet on uneven surface  activity   Assist Walk 10 feet on uneven surfaces activity did not occur: Safety/medical concerns         Wheelchair     Assist Is the patient using a wheelchair?: Yes Type of Wheelchair: Manual    Wheelchair assist level: Dependent - Patient 0%      Wheelchair 50 feet with 2 turns activity    Assist        Assist Level: Dependent - Patient 0%   Wheelchair 150 feet activity     Assist      Assist Level: Dependent - Patient 0%   Blood pressure (!) 152/62, pulse 60, temperature 98.5 F (36.9 C), resp. rate 17, height 6' (1.829 m), weight 76.9 kg, SpO2 98%.  1. Functional deficits secondary to acute pontine infarct             -patient may  shower             -ELOS/Goals: 9-12 days Min A to supervision - 11/1 DC              - stable to continue CIR  - 10/27: Will recheck U/A/culture, does not appear as oriented this AM as prior notes, Discussed with therapy- not felt to be off his usual baseline as he waxes and  waves, nursing reports oriented x3 later in day, continue to monitor    - 10/28: Per therapies at  cognitive baseline. Min A bed mobility - up to 300 ft with RW with Min A. Self-limiting and some cognitive issues. Setup UBD, L lateral lean with sitting and standing. Poor motivation /apathy huge. Some L inattention. Moderate cognitive deficits,  effected by self-limiting and poor participation. Very defiant behaviorally.   2.  Antithrombotics: -DVT/anticoagulation:  Pharmaceutical: Lovenox              -antiplatelet therapy: Aspirin  and  Plavix  until 10/28 then aspirin  alone--finished Plavix    3. Pain Management: Oxycodone  and Tylenol  as needed--well-controlled   4. Mood/Behavior/Sleep: LCSW to follow for evaluation and support when available.              -antipsychotic agents: Lexapro  10 mg daily - Delirium : Seroquel  25 mg nightly--tolerating well  --waxes/wanes - 10/28: Move seroquel  to PRN, add mirtazepine 15 mg at bedtime for sleep and mood.    5. Neuropsych/cognition: This patient may be intermittently capable of making decisions on their own behalf.   6. Skin/Wound Care: Routine pressure relief measures             -Stage 2 Sacral wound--wound care orders placed--Gerhardt's butt cream +Zinc  PO   7. Fluids/Electrolytes/Nutrition: Monitor intake and output.  CBC/CMET in a.m.             - SLP consult             -GERD: Continue PPI   -10-25: Albumin 2.4.  Encourage p.o. intakes.  - BMP ordered 10-29 due to orthostasis, possible hypovolemia   8.  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  Atorvastatin  80 mg daily.   9.  Acute metabolic encephalopathy: Secondary to aspiration pneumonia and hypotension.  Mentation improved continue to monitor.   10. Hypomagnesia: Mg 1.4<2.0<1.5< now 1.6 monitor and replace.    11.  Bladder outlet obstruction: Chronic foley catheter last changed 10/18             -continue CHG and foley catheter care.  - f/u with urology outpatient.    12.  HTN: resumed home Losartan  25 mg daily, monitor BP with  increased activity for hypotension    - 10-26: BP slightly elevated this a.m., back down this afternoon.  Monitor    -10/27 BP controlled, overall, continue current regimen   10-28: Orthostatic hypotension with therapies, severe.  Hold losartan  for tomorrow AM.  Encourage p.o. fluids today.  13.  T2DM: A1c 8.5% on 10/7  -monitor cbgs ac/hs with Novolog  SSI and Lantus  15 units  -10-26: Blood sugars consistently elevated, increase Semglee  to 20 units daily -10/27 increase semglee  to 23 units - 10/28 appears semglee  was Dced?? Add back at 23 U Recent Labs    02/06/24 1644 02/06/24 2135 02/07/24 0621  GLUCAP 184* 199* 163*     14.CAD/HLD: Atorvastatin    15.  Severe sepsis due to aspiration RLL PNA: Initially treated with IV meropenem now transition to p.o. Augmentin  ends 10/24.  ID consulted, Dr. Fleeta Rothman recommended exchanging Foley due to fungemia.             - Aspiration precautions             - Palliative care consult: Patient remains DNR limited   16. Cholelithiasis: Maybe chronic, no current GI symptoms. Continue to monitor.    17.  Heart block: S/p PPM    -  Heart rate well-controlled, monitor    02/07/2024    5:06 AM 02/06/2024    9:36 PM 02/06/2024    4:46 PM  Vitals with BMI  Systolic 152 130 863  Diastolic 62 68 62  Pulse 60 77 63    18.  Constipation.  Last bowel movement 10-20.  - Start Senokot S1 tab twice daily   - Miralax  PRN--10-26 add to daily  - 10-26: Add as needed sorbitol ; would give if no bowel movement tonight  -10/27 LBM today, continue to monitor -- incontinent per therapies  Large bowel movement 10-28; continue current regimen LOS: 4 days A FACE TO FACE EVALUATION WAS PERFORMED  John [name] 02/07/2024, 9:33 AM

## 2024-02-07 NOTE — Plan of Care (Signed)
 Problem: Consults Goal: RH GENERAL PATIENT EDUCATION Description: See Patient Education module for education specifics. Outcome: Progressing   Problem: RH BOWEL ELIMINATION Goal: RH STG MANAGE BOWEL WITH ASSISTANCE Description: STG Manage Bowel with supervision Assistance. Outcome: Progressing   Problem: RH BLADDER ELIMINATION Goal: RH STG MANAGE BLADDER WITH ASSISTANCE Description: STG Manage Bladder With supervision  Assistance Outcome: Progressing   Problem: RH SKIN INTEGRITY Goal: RH STG SKIN FREE OF INFECTION/BREAKDOWN Description: Manage skin free of infection with supervision assistance Outcome: Progressing   Problem: RH SAFETY Goal: RH STG ADHERE TO SAFETY PRECAUTIONS W/ASSISTANCE/DEVICE Description: STG Adhere to Safety Precautions With supervision  Assistance/Device. Outcome: Progressing   Problem: RH PAIN MANAGEMENT Goal: RH STG PAIN MANAGED AT OR BELOW PT'S PAIN GOAL Description: <4 w/ prns Outcome: Progressing   Problem: RH KNOWLEDGE DEFICIT GENERAL Goal: RH STG INCREASE KNOWLEDGE OF SELF CARE AFTER HOSPITALIZATION Description: Manage increase  knowledge of self care after hospitalization with supervision assistance from wife using educational materials provided Outcome: Progressing

## 2024-02-07 NOTE — Progress Notes (Signed)
 Occupational Therapy Note  Patient Details  Name: John [name] MRN: [id number] Date of Birth: [birth year]   Occupational Therapist participated in the interdisciplinary team conference, providing clinical information regarding the patient's current status, treatment goals, and weekly focus, including any barriers that need to be addressed. Please see the Inpatient Rehabilitation Team Conference and Plan of Care Update for further details.  [name], OTR/L, MSOT  02/07/2024, 4:07 PM

## 2024-02-07 NOTE — Progress Notes (Signed)
 Speech Language Pathology Daily Session Note  Patient Details  Name: Daymond Cordts MRN: [id number] Date of Birth: 1945-06-06  Today's Date: 02/07/2024 SLP Individual Time: 1100-1200 SLP Individual Time Calculation (min): 60 min  Short Term Goals: Week 1: SLP Short Term Goal 1 (Week 1): Patient will utilize WRAP memory strategies to recall daily information given maxA SLP Short Term Goal 2 (Week 1): During speech therapy tasks, patient will sustain attention for intervals given maxA SLP Short Term Goal 3 (Week 1): Patient will orient to time using external aids given modA SLP Short Term Goal 4 (Week 1): Patient will state 2 cognitive deficits given maxA  Skilled Therapeutic Interventions:   Pt greeted in his room, up in his WC upon SLP arrival. He was leaning to the L and unaware of this unsafe positioning. He was attempting to undo his chair alarm and required maxA for safety awareness and sequencing to transfer to the bathroom via stedy. He required maxA cueing to maintain midline/upright positioning. One incontinent bowel movement noted upon doffing of pants. However, pt was able to have an additional BM on the toilet. He required maxA for problem solving and attention throughout peri care and transfer back to chair. Once back in his chair, SLP checked his vitals d/t reduced initiation of responses. His BP was 88/54 and HR was 80. SLP and RNT assisted him back to bed. Once supine, his BP increased to 96/57 and HR 88. He required errorless learning for orientation, but did demonstrate less frustration/anger towards SLP during conversational cognitive tasks. He remains incredibly sarcastic - anticipate he is utilizing humor to his true severity of deficits. He was left supine in bed, nursing notified of hypotensive episode. DO present at the end of tx tasks as well, aware of BP. Bed alarm set and call light within reach. Recommend cont ST per POC.   Pain Pain Assessment Pain Scale:  0-10 Pain Score: 0-No pain  Therapy/Group: Individual Therapy  [name] 02/07/2024, 4:23 PM

## 2024-02-07 NOTE — Progress Notes (Signed)
 Occupational Therapy Session Note  Patient Details  Name: John [name] MRN: [id number] Date of Birth: 1945-08-02  Today's Date: 02/07/2024 OT Individual Time: 1500-1600 OT Individual Time Calculation (min): 60 min    Short Term Goals: Week 1:  OT Short Term Goal 1 (Week 1): Pt will complete LB dressing at Max A with AE as necessary OT Short Term Goal 2 (Week 1): Pt will complete toilet transfer at Max A with LRAD OT Short Term Goal 3 (Week 1): Pt will complete UB dressing with min A OT Short Term Goal 4 (Week 1): Pt will demo improved awareness to deficits by identifying one deficit with mod vc  Skilled Therapeutic Interventions/Progress Updates:      Therapy Documentation Precautions:  Precautions Precautions: Fall Precaution Booklet Issued: No Recall of Precautions/Restrictions: Impaired Precaution/Restrictions Comments: L hemiparesis Restrictions Weight Bearing Restrictions Per Provider Order: No General: Pt supine in bed upon OT arrival, agreeable to OT session. Pt awake and cooperative. Pt has few moments of aggressive language, OT using calming strategies and voice to calm patient. Wife present. OT session focusing on education.  Pain: no pain reported  Other Treatments: Pt wife relayed concern to OT that he is laying on his back, saying she has seen redness on his buttocks and back. OT educating we make sure he is turning and applying foam dressing to his sacrum to prevent further redness. Pt relaying concern he missed therapy today. He said he has his eyes closed but is fully awake and has been having instances like that, kind of like playing possum almost. Wife also reporting will be snappy with her and pt is not like that normally. OT relaying information to primary team. Team has behavior meeting tomorrow.   Pt supine in bed with bed alarm activated, 2 bed rails up, call light within reach and 4Ps assessed.    Therapy/Group: Individual Therapy  Camie Hoe,  OTD, OTR/L 02/07/2024, 4:45 PM

## 2024-02-07 NOTE — Progress Notes (Signed)
 Patient ID: John [name], male   DOB: 03-20-1946, 78 y.o.   MRN: [id number]  SW met with pt in room to provide updates from team conference  [id number] spoke with pt wife to provide updates from team conference, and d/c date 11/8. SW discussed fam edu. Scheduled for next Wed 1p-4pm. Prefers [facility].  SW sent HHPT/OT/SLP/aide referral to [facility] and waiting on follow-up.  *referral accepted.   [name], MSW, LCSW Office: [phone] Cell: [phone] Fax: [phone]

## 2024-02-07 NOTE — Progress Notes (Signed)
 Physical Therapy Session Note  Patient Details  Name: John [name] MRN: [id number] Date of Birth: Nov 16, 1945  Today's Date: 02/07/2024 PT Individual Time: 0950-1030 PT Individual Time Calculation (min): 40 min   Short Term Goals: Week 1:  PT Short Term Goal 1 (Week 1): Pt will perform supine<->sit with CGA PT Short Term Goal 2 (Week 1): Pt will ambulate 150' with RW min assist PT Short Term Goal 3 (Week 1): Pt will complete sit to stand with min assist consistently PT Short Term Goal 4 (Week 1): Pt will complete up/down 4 steps BHRs with CGA  Skilled Therapeutic Interventions/Progress Updates:    Pt presents in room in bed, asleep and requires increased time to awaken. Pt denies pain. Session focused on therapeutic activities to promote independence and safety with transfers, bed mobility, OOB tolerance, and attention to task. Pt requires mod encouragement to participate with eating breakfast, and max encouragement to facilitate transfer out of bed, requiring significant increase in time. Pt completes bed mobility with supervision with max elevated HOB, hospital bed rails, increased time and cues for attention to task. Pt completes to sitting EOB, requires min assist to stand to RW, completes ambulatory transfer 5' from bed to [facility] with cues for LLE advance and foot placement with pt demonstrating increased L inattention with transfer noted. Pt comes to sitting in TIS WC and set up with breakfast. Pt able to correctly identify need for fork to eat breakfast. Pt requires significant time to eat, completed to promote improved nutritional intake, upright posture and sitting balance, and OOB tolerance. Pt demonstrating L lateral lean during eating and provided with cues to correct to midline. Pt remains seated in TIS WC with all needs within reach, call light in place, chair alarm donned and activated at end of session.  Therapy Documentation Precautions:  Precautions Precautions:  Fall Precaution Booklet Issued: No Recall of Precautions/Restrictions: Impaired Precaution/Restrictions Comments: L hemiparesis Restrictions Weight Bearing Restrictions Per Provider Order: No   Therapy/Group: Individual Therapy  Reche Ohara PT, DPT 02/07/2024, 4:49 PM

## 2024-02-07 NOTE — Progress Notes (Signed)
 Physical Therapy Note  Patient Details  Name: John [name] MRN: [id number] Date of Birth: 03-Aug-1945 Today's Date: 02/07/2024    Physical Therapist participated in the interdisciplinary team conference, providing clinical information regarding the patient's current status, treatment goals, and weekly focus, including any barriers that need to be addressed. Please see the Inpatient Rehabilitation Team Conference and Plan of Care Update for further details.    Reche Ohara PT, DPT 02/07/2024, 5:01 PM

## 2024-02-07 NOTE — Plan of Care (Signed)
  Problem: Consults Goal: RH GENERAL PATIENT EDUCATION Description: See Patient Education module for education specifics. Outcome: Progressing   Problem: RH BOWEL ELIMINATION Goal: RH STG MANAGE BOWEL WITH ASSISTANCE Description: STG Manage Bowel with supervision Assistance. Outcome: Progressing   Problem: RH BLADDER ELIMINATION Goal: RH STG MANAGE BLADDER WITH ASSISTANCE Description: STG Manage Bladder With supervision Assistance Outcome: Progressing   Problem: RH SKIN INTEGRITY Goal: RH STG SKIN FREE OF INFECTION/BREAKDOWN Description: Manage skin free of infection with supervision assistance Outcome: Progressing   Problem: RH SAFETY Goal: RH STG ADHERE TO SAFETY PRECAUTIONS W/ASSISTANCE/DEVICE Description: STG Adhere to Safety Precautions With supervision Assistance/Device. Outcome: Progressing

## 2024-02-07 NOTE — Patient Care Conference (Signed)
 Inpatient RehabilitationTeam Conference and Plan of Care Update Date: 02/07/2024   Time: 1037 am    Patient Name: John [name]      Medical Record Number: [id number]  Date of Birth: 1946-01-21 Sex: Male         Room/Bed: 4M12C/4M12C-01 Payor Info: Payor: [employer]UE [employer] MEDICARE / Plan: BCBS MEDICARE / Product Type: *No Product type* /    Admit Date/Time:  02/03/2024  4:25 PM  Primary Diagnosis:  CVA (cerebral vascular accident) [facility])  Hospital Problems: Principal Problem:   CVA (cerebral vascular accident) [facility])    Expected Discharge Date: Expected Discharge Date: 02/18/24  Team Members Present: Physician leading conference: Dr. Joesph Likes Social Worker Present: [name], LCSW Nurse Present: Eulalio Falls, RN PT Present: Catilin Osborn, PT OT Present: [name], OT SLP Present: Recardo Mole, SLP PPS Coordinator present : Eleanor Colon, SLP     Current Status/Progress Goal Weekly Team Focus  Bowel/Bladder   Currently incontinent with bowel, Idwelling Foley Catheter. LBM 10/27   Will be free from CAUTI   Assist with toilet needs qshift/prn and provide foley care qshift/after each bowel movement and provide education to prevent CAUTI    Swallow/Nutrition/ Hydration   regular/thin - apparently refused all trials during eval - no difficulty noted w/ meds yesterday           ADL's   Setup for UB care in suported sitting (otherwise Min A for sitting balance); Max-Total A for LB care (dependent on level of motivation). Barriers: Fluctuating levels of motivation/apathy, decreased midline orientation, mild L-sided inattention.   Min A   Generalized conditioning, dynamic standing balance, activity tolerance, functional reach for LB care    Mobility   min assist bed mobility, transfers min assist, gait min assist 300' RW   supervision ambulatory  barriers: self limiting, cognition; focus on gait training, transfer training, balance, endurance     Communication                Safety/Cognition/ Behavioral Observations  moderate to severe cognitive deficits: orientation, STM, attention, problem solving, and awareness - success significantly impacted by pt personality/participation   min to modA   pt participation, pt/fmaily edu, cognitive retraining, and compensatory memory strategies    Pain   Denies pain at this time   Will be free from pain   Assess pain qshift/prn    Skin   Erythema/redness to sacrum  MASD buttocks: gerharts Turn schedule Will maintain skin intergrity with no breakdown  Assess skin qshift/prn and provide education to prevent breakdown      Discharge Planning:  pt wil d/ to home with his wife who is primary caregiver. He needs to be as independent as possible. SW will confirm there are no barriers to discharge.    Team Discussion: Patient was admitted post pontine stroke. Mood / blood pressure/ diabetes medications  adjusted by MD. Patient progress limited by self limiting behaviors, moderate cognitive deficits, poor motivation, left inattention, poor participation with care.   Patient on target to meet rehab goals: Patient currently needs set up assistance with upper body care and max-total assistance with lower body care dependent  on level of motivation. Patient needs min assistance with transfers. Patient was able to ambulate up to 300' with min assistance using a rolling walker. Patient with mod cognitive behaviors uses self limiting behaviors and not receptive to education. Overall goals at discharge are set for min- supervision assistance.   *See Care Plan and progress  notes for long and short-term goals.   Revisions to Treatment Plan:  Behavior plan Neuro psych consult  Sleep pattern Turn schedule Open blinds in room in the morning to promote wake cycles  Teaching Needs: Safety, medications, transfers, toileting, etc.   Current Barriers to Discharge: Decreased caregiver support,  Home enviroment access/layout, and Behavior  Possible Resolutions to Barriers: Family Education Home health follow - up     Medical Summary Current Status: Medically complicated by cognitive deficits/delirium, chronic Foley catheter with ?UTI, hypertension, type 2 diabetes with hyperglycemia, constipation  Barriers to Discharge: Behavior/Mood;Cardiac Complications;Incontinence;Infection/IV Antibiotics;Medical stability;Self-care education;Uncontrolled Diabetes   Possible Resolutions to [employer]: Titrate diabetes medications, monitor results of UTI workup, wean off antipsychotics for delirium/sleep deficit, titrate blood pressure medications   Continued Need for Acute Rehabilitation Level of Care: The patient requires daily medical management by a physician with specialized training in physical medicine and rehabilitation for the following reasons: Direction of a multidisciplinary physical rehabilitation program to maximize functional independence : Yes Medical management of patient stability for increased activity during participation in an intensive rehabilitation regime.: Yes Analysis of laboratory values and/or radiology reports with any subsequent need for medication adjustment and/or medical intervention. : Yes   I attest that I was present, lead the team conference, and concur with the assessment and plan of the team.   John [name] 02/07/2024, 1037 am

## 2024-02-07 NOTE — Progress Notes (Addendum)
 Patient stated at this time he denies pain, however nurse encouraged/educated patient on morning medications. Patient stated he did not want to take them at this time, will reattempt in 1 hour, per patient agreeance w/ the time 10am.   Geni Armor, LPN

## 2024-02-07 NOTE — Progress Notes (Signed)
 Patient tolerated medication with no issues.   Geni Armor, LPN

## 2024-02-08 DIAGNOSIS — I639 Cerebral infarction, unspecified: Secondary | ICD-10-CM | POA: Diagnosis not present

## 2024-02-08 LAB — GLUCOSE, CAPILLARY
Glucose-Capillary: 163 mg/dL — ABNORMAL HIGH (ref 70–99)
Glucose-Capillary: 162 mg/dL — ABNORMAL HIGH (ref 70–99)
Glucose-Capillary: 157 mg/dL — ABNORMAL HIGH (ref 70–99)
Glucose-Capillary: 187 mg/dL — ABNORMAL HIGH (ref 70–99)

## 2024-02-08 LAB — BASIC METABOLIC PANEL WITH GFR
Anion gap: 10 (ref 5–15)
BUN: 19 mg/dL (ref 8–23)
CO2: 24 mmol/L (ref 22–32)
Calcium: 9.5 mg/dL (ref 8.9–10.3)
Chloride: 108 mmol/L (ref 98–111)
Creatinine, Ser: 1.08 mg/dL (ref 0.61–1.24)
GFR, Estimated: >60 mL/min (ref >60)
Glucose, Bld: 178 mg/dL — ABNORMAL HIGH (ref 70–99)
Potassium: 3.8 mmol/L (ref 3.5–5.1)
Sodium: 142 mmol/L (ref 135–145)

## 2024-02-08 MED ORDER — QUETIAPINE FUMARATE 25 MG PO TABS
25.0000 mg | ORAL_TABLET | Freq: Every day | ORAL | Status: DC
  Administered 2024-02-08 – 2024-02-09 (×2): 25 mg via ORAL
  Filled 2024-02-08 (×2): qty 1

## 2024-02-08 MED ORDER — METFORMIN HCL 500 MG PO TABS
500.0000 mg | ORAL_TABLET | Freq: Two times a day (BID) | ORAL | Status: DC
  Administered 2024-02-08 – 2024-02-16 (×17): 500 mg via ORAL
  Filled 2024-02-08 (×17): qty 1

## 2024-02-08 NOTE — Progress Notes (Signed)
 Physical Therapy Session Note  Patient Details  Name: John [name] MRN: [id number] Date of Birth: 09/21/1945  Today's Date: 02/08/2024 PT Individual Time: 0950-1045 PT Individual Time Calculation (min): 55 min   Short Term Goals: Week 1:  PT Short Term Goal 1 (Week 1): Pt will perform supine<->sit with CGA PT Short Term Goal 2 (Week 1): Pt will ambulate 150' with RW min assist PT Short Term Goal 3 (Week 1): Pt will complete sit to stand with min assist consistently PT Short Term Goal 4 (Week 1): Pt will complete up/down 4 steps BHRs with CGA  Skilled Therapeutic Interventions/Progress Updates:    Pt presents in room in [facility], agreeable to PT with encouragement. Pt does not report pain but endorsing fatigue and stating he wished he was asleep throughout session. Pt requires encouragement for participation throughout session due to decreased motivation. Session focused on gait training for tolerance to upright and therapeutic activities to promote improved activity tolerance. Pt transported to day room. Pt completes sit to stand with min assist to RW then ambulates with RW 100' with min assist with pt demonstrating increased difficulty with LLE foot clearance, very mild L lateral lean and L inattention this session compared to previous sessions. Pt reporting fatigue, returns to sitting in WC. Pt transported to day room and participates with continuous training on nustep 15 min on level 2 with BUE/BLE, maintaining 30-35 SPM throughout task, minimal cues provided as pt demonstrating stopping activity with therapist speaking to pt as pt demonstrates decreased motivation however does tolerate task well. Pt then completes gait 180' with RW min assist, increased time to complete with slow gait speed however improved midline orientation and L foot clearance with 2nd gait trial following training on nustep. Pt returned to room and completes stand step transfer with min/CGA to bed, completes bed mobility with  increased time however supervision only, does require min assist for repositioning trunk in supine. Pt able to adjust bed to preference and pt remains semi reclined with all needs within reach, call light in place bed alarm activated at end of session.  Therapy Documentation Precautions:  Precautions Precautions: Fall Precaution Booklet Issued: No Recall of Precautions/Restrictions: Impaired Precaution/Restrictions Comments: L hemiparesis Restrictions Weight Bearing Restrictions Per Provider Order: No   Therapy/Group: Individual Therapy  Reche Ohara PT, DPT 02/08/2024, 10:51 AM

## 2024-02-08 NOTE — Progress Notes (Addendum)
 PROGRESS NOTE   Subjective/Complaints:  No events overnight.  Vital stable.  Blood sugars elevated throughout yesterday, 200s, 163 this a.m. A.m. labs stable  No sleep overnight per log. Patient agrees, difficulty getting comfortable, feleing tired today.   ROS: Denies fevers, chills, N/V, abdominal pain, nausea, constipation, diarrhea, SOB, cough, chest pain, new weakness or paraesthesias.   Insomnia   Objective:   No results found. Recent Labs    02/06/24 0515  WBC 7.8  HGB 10.8*  HCT 31.9*  PLT 325   Recent Labs    02/06/24 0515 02/08/24 0511  NA 138 142  K 3.7 3.8  CL 102 108  CO2 25 24  GLUCOSE 209* 178*  BUN 16 19  CREATININE 0.92 1.08  CALCIUM  8.9 9.5    Intake/Output Summary (Last 24 hours) at 02/08/2024 0817 Last data filed at 02/08/2024 0657 Gross per 24 hour  Intake 480 ml  Output 2200 ml  Net -1720 ml        Physical Exam: Vital Signs Blood pressure (!) 132/59, pulse 76, temperature 98.4 F (36.9 C), resp. rate 18, height 6' (1.829 m), weight 76.9 kg, SpO2 96%.  Constitutional: No apparent distress. Appropriate appearance for age.  Being transferred to bed after orthostatic episode. HENT: No JVD. Neck Supple. Trachea midline. Atraumatic, normocephalic. Eyes: PERRLA. EOMI. Visual fields grossly intact.  Cardiovascular: RRR, no murmurs/rub/gallops. No Edema. Peripheral pulses 2+  Respiratory: Mild crackles and expiratory wheezes. On RA.  Abdomen: + bowel sounds, normoactive. No distention or tenderness.  GU: Not examined. +Foley, draining clear urine.  Skin: C/D/I. No apparent lesions.  MSK:      No apparent deformity.       Neurologic exam:  Cognition: AAO to person, place, and partially to time. + Mild to moderate cognitive deficits, difficulty with abstraction--joking answers to cover deficits  Language: Fluent, No substitutions or neoglisms. No dysarthria.  Insight: Fair  insight into current condition.  Mood: Pleasant affect, appropriate mood.  Sensation: To light touch intact in BL UEs and LEs  Reflexes: 2+ in BL UE and LEs. Negative Hoffman's and babinski signs bilaterally.  CN: 2-12 grossly intact.  Coordination: Mild left upper and lower extremity ataxia Spasticity: MAS 0 in all extremities.        Strength: No appreciable change 10-28                RUE: 5/5 SA, 5/5 EF, 5/5 EE, 5/5 WE, 5/5 FF, 5/5 FA                LUE:  5-/5 SA, 5-/5 EF, 5-/5 EE, 5-/5 WE, 5-/5 FF, 5-/5 FA                RLE: 5/5 HF, 5/5 KE, 5/5  DF, 5/5  EHL, 5/5  PF                 LLE:  3/5 HF, 3/5 KE, 4/5  DF, 4/5  EHL, 4/5  PF   Physical exam unchanged from the above on reexamination 02/08/24    Assessment/Plan: 1. Functional deficits which require 3+ hours per day of interdisciplinary therapy in a comprehensive inpatient rehab  setting. Physiatrist is providing close team supervision and 24 hour management of active medical problems listed below. Physiatrist and rehab team continue to assess barriers to discharge/monitor patient progress toward functional and medical goals  Care Tool:  Bathing    Body parts bathed by patient: Right arm, Chest, Left arm, Abdomen, Front perineal area, Face   Body parts bathed by helper: Buttocks, Right upper leg, Left upper leg, Right lower leg, Left lower leg     Bathing assist Assist Level: Maximal Assistance - Patient 24 - 49%     Upper Body Dressing/Undressing Upper body dressing   What is the patient wearing?: Pull over shirt    Upper body assist Assist Level: Moderate Assistance - Patient 50 - 74%    Lower Body Dressing/Undressing Lower body dressing      What is the patient wearing?: Underwear/pull up, Pants     Lower body assist Assist for lower body dressing: Maximal Assistance - Patient 25 - 49%     Toileting Toileting    Toileting assist Assist for toileting: 2 Helpers     Transfers Chair/bed  transfer  Transfers assist     Chair/bed transfer assist level: Moderate Assistance - Patient 50 - 74%     Locomotion Ambulation   Ambulation assist      Assist level: 2 helpers Assistive device: Walker-rolling Max distance: 112'   Walk 10 feet activity   Assist     Assist level: 2 helpers Assistive device: Walker-rolling   Walk 50 feet activity   Assist    Assist level: 2 helpers Assistive device: Walker-rolling    Walk 150 feet activity   Assist Walk 150 feet activity did not occur: Safety/medical concerns (Max distance 112')         Walk 10 feet on uneven surface  activity   Assist Walk 10 feet on uneven surfaces activity did not occur: Safety/medical concerns         Wheelchair     Assist Is the patient using a wheelchair?: Yes Type of Wheelchair: Manual    Wheelchair assist level: Dependent - Patient 0%      Wheelchair 50 feet with 2 turns activity    Assist        Assist Level: Dependent - Patient 0%   Wheelchair 150 feet activity     Assist      Assist Level: Dependent - Patient 0%   Blood pressure (!) 132/59, pulse 76, temperature 98.4 F (36.9 C), resp. rate 18, height 6' (1.829 m), weight 76.9 kg, SpO2 96%.  1. Functional deficits secondary to acute pontine infarct             -patient may  shower             -ELOS/Goals: 9-12 days Min A to supervision - 11/1 DC              - stable to continue CIR  - 10/27: Will recheck U/A/culture, does not appear as oriented this AM as prior notes, Discussed with therapy- not felt to be off his usual baseline as he waxes and waves, nursing reports oriented x3 later in day, continue to monitor    - 10/28: Per therapies at cognitive baseline. Min A bed mobility - up to 300 ft with RW with Min A. Self-limiting and some cognitive issues. Setup UBD, L lateral lean with sitting and standing. Poor motivation /apathy huge. Some L inattention. Moderate cognitive deficits,   effected by self-limiting and poor  participation. Very defiant behaviorally.   - Behavior plan placed by team  2.  Antithrombotics: -DVT/anticoagulation:  Pharmaceutical: Lovenox              -antiplatelet therapy: Aspirin  and  Plavix  until 10/28 then aspirin  alone--finished Plavix    3. Pain Management: Oxycodone  and Tylenol  as needed--well-controlled   4. Mood/Behavior/Sleep: LCSW to follow for evaluation and support when available.              -antipsychotic agents: Lexapro  10 mg daily - Delirium : Seroquel  25 mg nightly--tolerating well  --waxes/wanes - 10/28: Move seroquel  to PRN, add mirtazepine 15 mg at bedtime for sleep and mood 10/29: Poor sleep since removal of seroquel ; resume at 25 mg QHS   5. Neuropsych/cognition: This patient may be intermittently capable of making decisions on their own behalf.   6. Skin/Wound Care: Routine pressure relief measures             -Stage 2 Sacral wound--wound care orders placed--Gerhardt's butt cream +Zinc  PO   7. Fluids/Electrolytes/Nutrition: Monitor intake and output.  CBC/CMET in a.m.             - SLP consult             -GERD: Continue PPI   -10-25: Albumin 2.4.  Encourage p.o. intakes.  - BMP ordered 10-29 due to orthostasis, possible hypovolemia--a.m. labs stable.   8.  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  Atorvastatin  80 mg daily.   9.  Acute metabolic encephalopathy: Secondary to aspiration pneumonia and hypotension.  Mentation improved continue to monitor.   10. Hypomagnesia: Mg 1.4<2.0<1.5< now 1.6 monitor and replace.    11.  Bladder outlet obstruction: Chronic foley catheter last changed 10/18             -continue CHG and foley catheter care.  - f/u with urology outpatient.    12.  HTN: resumed home Losartan  25 mg daily, monitor BP with increased activity for hypotension    - 10-26: BP slightly elevated this a.m., back down this afternoon.  Monitor    -10/27 BP  controlled, overall, continue current regimen   10-28: Orthostatic hypotension with therapies, severe.  Hold losartan  for tomorrow AM.  Encourage p.o. fluids today.  - Monitor blood pressure with therapies today  13.  T2DM: A1c 8.5% on 10/7  -monitor cbgs ac/hs with Novolog  SSI and Lantus  15 units  -10-26: Blood sugars consistently elevated, increase Semglee  to 20 units daily -10/27 increase semglee  to 23 units - 10/28 appears semglee  was Dced?? Add back at 23 U -10-29: Low in a.m., apparent under coverage for meals.  Resume home metformin  500 mg twice daily.  May need more Premeal added. Recent Labs    02/07/24 1726 02/07/24 2226 02/08/24 0644  GLUCAP 258* 205* 163*     14.CAD/HLD: Atorvastatin    15.  Severe sepsis due to aspiration RLL PNA: Initially treated with IV meropenem now transition to p.o. Augmentin  ends 10/24.  ID consulted, Dr. Fleeta Rothman recommended exchanging Foley due to fungemia.             - Aspiration precautions             - Palliative care consult: Patient remains DNR limited   16. Cholelithiasis: Maybe chronic, no current GI symptoms. Continue to monitor.    17.  Heart block: S/p PPM    - Heart rate well-controlled, monitor    02/07/2024   10:24 PM 02/07/2024  1:44 PM 02/07/2024    5:06 AM  Vitals with BMI  Systolic 132 128 847  Diastolic 59 57 62  Pulse 76 65 60    18.  Constipation.  Last bowel movement 10-20.  - Start Senokot S1 tab twice daily   - Miralax  PRN--10-26 add to daily  - 10-26: Add as needed sorbitol ; would give if no bowel movement tonight  -10/27 LBM today, continue to monitor -- incontinent per therapies  Large bowel movement 10-28; continue current regimen LOS: 5 days A FACE TO FACE EVALUATION WAS PERFORMED  Joesph JAYSON Likes 02/08/2024, 8:17 AM

## 2024-02-08 NOTE — Progress Notes (Signed)
 Speech Language Pathology Daily Session Note  Patient Details  Name: John [name] MRN: [id number] Date of Birth: 05/13/1945  Today's Date: 02/08/2024 SLP Individual Time: 1430-1530 SLP Individual Time Calculation (min): 60 min  Short Term Goals: Week 1: SLP Short Term Goal 1 (Week 1): Patient will utilize WRAP memory strategies to recall daily information given maxA SLP Short Term Goal 2 (Week 1): During speech therapy tasks, patient will sustain attention for intervals given maxA SLP Short Term Goal 3 (Week 1): Patient will orient to time using external aids given modA SLP Short Term Goal 4 (Week 1): Patient will state 2 cognitive deficits given maxA  Skilled Therapeutic Interventions:   Pt greeted at bedside for tx targeting cognition. SLP initiated conversational tasks to assist w/ pt participation. He initially reported completing no therapy today, endorsing they just ran their mouths. SLP was able to utilize EMR, review tx tasks completed in PT and OT today, and provide education re the purpose of tx tasks and the overall goal of CIR. He was not argumentative during education/errorless learning re his schedule but was not very receptive to this information either. He was able to utilize the calendar provided to ID today's date. Remains unable to recall recent CVA - info provided via errorless learning. SLP then facilitated structured recall task w/ 5 pictures. He immediately recalled 4/5 items. After ~3 min delay and additional 10 min delay, he recalled 4/5 items. Between recall times, he completed a verbal time management task. He required modA for information processing, working memory, and calculations. He was able to sustain attention for ~15 mins during task w/ modA. No verbal agitation or irritation noted throughout structured task. At the end of tx tasks, he was left in bed w/ the alarm set and call light within reach. Recommend cont ST per POC.   Pain  None  reported  Therapy/Group: Individual Therapy  John [name] 02/08/2024, 4:21 PM

## 2024-02-08 NOTE — Plan of Care (Signed)
 Speech Language Pathology Note  Patient Details  Name: John [name] MRN: [id number] Date of Birth: Apr 22, 1945 Today's Date: 02/08/2024    02/08/24 0700  Behavioral Plan Guideline  Behavior to decrease/eliminate Decrease verbal agitation with staff;Increase participation;Increase arousal;Decrease incontinence  Changes to environment Lights on, blinds open during the day, off and closed at night;Door open when staff not in room;Within arms reach during toileting;Play preferred music;Place signs in room to remind pt to call for assistance  Interventions Bed alarm setting;# of rails up;TIS w/c to reduce impulsivity;Limit nighttime interruptions  Bed alarm setting Medium sensitivity  # of rails up 3  Recommendations for interactions with patient Remain calm and reduce environment stimulation with agitation;DO NOT argue with patient;Provide gentle orientation or indirect orientation;Offer toileting with every interaction;Offer fluids every interaction;Offer patient preferred music (turn every 2 hours when in bed)  In Attendance at Behavior Plan Meeting  OT;PT;SLP;RN     [name] 02/08/2024, 8:07 AM

## 2024-02-08 NOTE — Progress Notes (Signed)
 Physical Therapy Session Note  Patient Details  Name: John [name] MRN: [id number] Date of Birth: 1945-12-21  Today's Date: 02/08/2024 PT Individual Time: 0900-0930 PT Individual Time Calculation (min): 30 min   Short Term Goals: Week 1:  PT Short Term Goal 1 (Week 1): Pt will perform supine<->sit with CGA PT Short Term Goal 2 (Week 1): Pt will ambulate 150' with RW min assist PT Short Term Goal 3 (Week 1): Pt will complete sit to stand with min assist consistently PT Short Term Goal 4 (Week 1): Pt will complete up/down 4 steps BHRs with CGA  Skilled Therapeutic Interventions/Progress Updates: Pt presented in TIS in NAD. Pt denies pain during session. Pt noted to have head rest that was significantly posterior and unable to move forward therefore PTA obtained new head rest and repositioned. Pt minimially responsive to changes but stated it felt fine. Pt then transported to day room and set up in [employer]. Participated in x 5 cycles of 1 min on 30 sec off at 60cm/sec for reciprocal activity and increased cardiovascular output. Pt requiring min cues for initiation of movement but able to consistently maintain. Pt transported back to room at end of session and remained in TIS with belt alarm on, call bell within reach and needs met.      Therapy Documentation Precautions:  Precautions Precautions: Fall Precaution Booklet Issued: No Recall of Precautions/Restrictions: Impaired Precaution/Restrictions Comments: L hemiparesis Restrictions Weight Bearing Restrictions Per Provider Order: No Therapy/Group: Individual Therapy  [name] 02/08/2024, 10:02 AM

## 2024-02-08 NOTE — Plan of Care (Signed)
  Problem: Consults Goal: RH GENERAL PATIENT EDUCATION Description: See Patient Education module for education specifics. Outcome: Progressing   Problem: RH SKIN INTEGRITY Goal: RH STG SKIN FREE OF INFECTION/BREAKDOWN Description: Manage skin free of infection with supervision assistance Outcome: Progressing

## 2024-02-08 NOTE — Plan of Care (Signed)
 Problem: Consults Goal: RH GENERAL PATIENT EDUCATION Description: See Patient Education module for education specifics. Outcome: Progressing   Problem: RH BOWEL ELIMINATION Goal: RH STG MANAGE BOWEL WITH ASSISTANCE Description: STG Manage Bowel with supervision Assistance. Outcome: Progressing   Problem: RH BLADDER ELIMINATION Goal: RH STG MANAGE BLADDER WITH ASSISTANCE Description: STG Manage Bladder With supervision  Assistance Outcome: Progressing   Problem: RH SKIN INTEGRITY Goal: RH STG SKIN FREE OF INFECTION/BREAKDOWN Description: Manage skin free of infection with supervision assistance Outcome: Progressing   Problem: RH SAFETY Goal: RH STG ADHERE TO SAFETY PRECAUTIONS W/ASSISTANCE/DEVICE Description: STG Adhere to Safety Precautions With supervision  Assistance/Device. Outcome: Progressing   Problem: RH PAIN MANAGEMENT Goal: RH STG PAIN MANAGED AT OR BELOW PT'S PAIN GOAL Description: <4 w/ prns Outcome: Progressing   Problem: RH KNOWLEDGE DEFICIT GENERAL Goal: RH STG INCREASE KNOWLEDGE OF SELF CARE AFTER HOSPITALIZATION Description: Manage increase  knowledge of self care after hospitalization with supervision assistance from wife using educational materials provided Outcome: Progressing

## 2024-02-08 NOTE — Progress Notes (Signed)
 Occupational Therapy Session Note  Patient Details  Name: John [name] MRN: [id number] Date of Birth: [birth year]  Today's Date: 02/08/2024 OT Individual Time: 0813-0900 OT Individual Time Calculation (min): 47 min    Short Term Goals: Week 1:  OT Short Term Goal 1 (Week 1): Pt will complete LB dressing at Max A with AE as necessary OT Short Term Goal 2 (Week 1): Pt will complete toilet transfer at Max A with LRAD OT Short Term Goal 3 (Week 1): Pt will complete UB dressing with min A OT Short Term Goal 4 (Week 1): Pt will demo improved awareness to deficits by identifying one deficit with mod vc  Skilled Therapeutic Interventions/Progress Updates:  Pt greeted resting in bed, increased mutimodal cuing provided for arousal (tactile, warm wash-cloth). HOH required for initiation of washing face, successful attempt at arousal with this technique. Transition to EOB with Total A, anticipate due to decreased motivation. Sit>stand from heightened EOB with Max A and multiple tries, OT encouraging participate for improved safety with mobility. Stand-step transfer to TIS with Mod A + cues for sequencing of transfer and attention to LLE placement. Sink-side sponge-bathing with HOH to initiate LB bathing, patient able to reach distal LE(s) with setup, total A for anterior/posterior care in standing. Max A for LB dressing. Bathing of UB with setup A, dressing with Max A due to time constraints. Pt remained sitting in TIS WC with posey belt donned and door open.   Therapy Documentation Precautions:  Precautions Precautions: Fall Precaution Booklet Issued: No Recall of Precautions/Restrictions: Impaired Precaution/Restrictions Comments: L hemiparesis Restrictions Weight Bearing Restrictions Per Provider Order: No   Therapy/Group: Individual Therapy  [name], OTR/L, MSOT  02/08/2024, 7:56 AM

## 2024-02-09 DIAGNOSIS — I639 Cerebral infarction, unspecified: Secondary | ICD-10-CM | POA: Diagnosis not present

## 2024-02-09 LAB — BASIC METABOLIC PANEL WITH GFR
Anion gap: 12 (ref 5–15)
BUN: 18 mg/dL (ref 8–23)
CO2: 23 mmol/L (ref 22–32)
Calcium: 9 mg/dL (ref 8.9–10.3)
Chloride: 105 mmol/L (ref 98–111)
Creatinine, Ser: 1.1 mg/dL (ref 0.61–1.24)
GFR, Estimated: >60 mL/min (ref >60)
Glucose, Bld: 131 mg/dL — ABNORMAL HIGH (ref 70–99)
Potassium: 3.9 mmol/L (ref 3.5–5.1)
Sodium: 140 mmol/L (ref 135–145)

## 2024-02-09 LAB — GLUCOSE, CAPILLARY
Glucose-Capillary: 138 mg/dL — ABNORMAL HIGH (ref 70–99)
Glucose-Capillary: 150 mg/dL — ABNORMAL HIGH (ref 70–99)
Glucose-Capillary: 141 mg/dL — ABNORMAL HIGH (ref 70–99)
Glucose-Capillary: 194 mg/dL — ABNORMAL HIGH (ref 70–99)

## 2024-02-09 LAB — URINALYSIS, W/ REFLEX TO CULTURE (INFECTION SUSPECTED)
Bilirubin Urine: NEGATIVE
Glucose, UA: NEGATIVE mg/dL
Hgb urine dipstick: NEGATIVE
Ketones, ur: NEGATIVE mg/dL
Nitrite: NEGATIVE
Protein, ur: NEGATIVE mg/dL
Specific Gravity, Urine: 1.02 (ref 1.005–1.030)
pH: 5 (ref 5.0–8.0)

## 2024-02-09 LAB — CBC
HCT: 32 % — ABNORMAL LOW (ref 39.0–52.0)
Hemoglobin: 10.7 g/dL — ABNORMAL LOW (ref 13.0–17.0)
MCH: 30.1 pg (ref 26.0–34.0)
MCHC: 33.4 g/dL (ref 30.0–36.0)
MCV: 90.1 fL (ref 80.0–100.0)
Platelets: 292 K/uL (ref 150–400)
RBC: 3.55 MIL/uL — ABNORMAL LOW (ref 4.22–5.81)
RDW: 17.2 % — ABNORMAL HIGH (ref 11.5–15.5)
WBC: 9 K/uL (ref 4.0–10.5)
nRBC: 0 % (ref 0.0–0.2)

## 2024-02-09 MED ORDER — SODIUM CHLORIDE 0.9 % BOLUS PEDS
1000.0000 mL | Freq: Once | INTRAVENOUS | Status: AC
  Administered 2024-02-09: 1000 mL via INTRAVENOUS

## 2024-02-09 MED ORDER — MIRTAZAPINE 15 MG PO TABS
7.5000 mg | ORAL_TABLET | Freq: Every day | ORAL | Status: DC
  Administered 2024-02-09 – 2024-02-19 (×11): 7.5 mg via ORAL
  Filled 2024-02-09 (×13): qty 1

## 2024-02-09 MED ORDER — MIRTAZAPINE 15 MG PO TABS: 7.5000 mg | ORAL_TABLET | Freq: Every day | ORAL | Status: DC

## 2024-02-09 NOTE — Transitions of Care (Post Inpatient/ED Visit) (Signed)
   02/09/2024  Name: Wilbert Hayashi MRN: [id number] DOB: 1945-10-01  Today's TOC FU Call Status:    Attempted to reach the patient regarding the most recent Inpatient/ED visit.  Follow Up Plan: No further outreach attempts will be made at this time. We have been unable to contact the patient.  Signature Julian Lemmings, LPN [facility] Nurse Health Advisor Direct Dial  [phone]

## 2024-02-09 NOTE — Progress Notes (Signed)
 Occupational Therapy Session Note  Patient Details  Name: John [name] MRN: [id number] Date of Birth: 1945-09-27  Today's Date: 02/09/2024 OT Individual Time: 0805-0900 OT Individual Time Calculation (min): 55 min   Today's Date: 02/09/2024 OT Individual Time: 1305-1330 OT Individual Time Calculation (min): 25 min   Short Term Goals: Week 1:  OT Short Term Goal 1 (Week 1): Pt will complete LB dressing at Max A with AE as necessary OT Short Term Goal 2 (Week 1): Pt will complete toilet transfer at Max A with LRAD OT Short Term Goal 3 (Week 1): Pt will complete UB dressing with min A OT Short Term Goal 4 (Week 1): Pt will demo improved awareness to deficits by identifying one deficit with mod vc  Skilled Therapeutic Interventions/Progress Updates:   Session 1: Pt greeted resting in bed (eyes closed), increased encouragement/cuing provided for arousal, including dependent movement of limbs and sternal rub. Total A provided for bed mobility to reach sitting EOB, patient unable to maintain static sitting balance without extensive support, therefore linen/pillows used to support upright position. OT encouraging oral intake with minimal success of ~2 bites of food. Pt shakes head no when presented with multiple items. Attempted sit>stand from EOB with no success (due to heavy dependence), therefore OT using stedy for EOB>TIS WC transfer, Max A required for transfer even with use of lift. BP assessed post transfers, noted at 89/56, B TEDs donned. Pt remained sitting in TIS WC, fully reclined with door open.   Session 2:  Pt greeted resting in bed, no reports of pain. Increased verbal communication and willingness to participate in therapeutic activities. Bed mobility with supervision-CGA. Sit<>stand with Min A, stand-step transfer with CGA + RW, increased time. Pt completes 1x10 reps of B leg extensions before verbalizing having had incontinent BM. Pt stands with Min, Max A provided for LB  garment management, patient able to cleanse periarea with Min-Mod A for standing balance. Pt then with second incontinent void. Nursing staff recruited for completion of care. Pt remained on [facility] with RN/student present, all other immediate needs met.  Post stand-step: BP=110/54  Therapy Documentation Precautions:  Precautions Precautions: Fall Precaution Booklet Issued: No Recall of Precautions/Restrictions: Impaired Precaution/Restrictions Comments: L hemiparesis Restrictions Weight Bearing Restrictions Per Provider Order: No   Therapy/Group: Individual Therapy  [name], OTR/L, MSOT  02/09/2024, 7:56 AM

## 2024-02-09 NOTE — Progress Notes (Addendum)
 Speech Language Pathology Daily Session Note  Patient Details  Name: Vaun Hyndman MRN: [id number] Date of Birth: 02/05/1946  Today's Date: 02/09/2024 SLP Individual Time: 1400-1453 SLP Individual Time Calculation (min): 53 min SLP Missed Time: 7 min SLP Missed Time Reason: Patient refusal   Short Term Goals: Week 1: SLP Short Term Goal 1 (Week 1): Patient will utilize WRAP memory strategies to recall daily information given maxA SLP Short Term Goal 2 (Week 1): During speech therapy tasks, patient will sustain attention for intervals given maxA SLP Short Term Goal 3 (Week 1): Patient will orient to time using external aids given modA SLP Short Term Goal 4 (Week 1): Patient will state 2 cognitive deficits given maxA  Skilled Therapeutic Interventions: SLP conducted skilled therapy session targeting cognitive goals. Patient was oriented using external room aids within one day, requiring min cues to correct orientation error. He required total assist to recall tasks completed during prior therapy sessions this date. SLP facilitated basic money management task where patient was asked to add bills and coins based on verbally provided prompt. Patient benefited from mod to max assist for error awareness and min assist for accuracy, problem solving, organization, and recall throughout. In the middle of task, patient became suddenly and unexpectedly frustrated with task, stating vehemently IM DONE but agreeable to participate in new task. Switched to 4 step sequencing task, where patient benefited from max assist for awareness and accurate task organization. Patient highly argumentative during task transition and only briefly amenable to continuing session. With 7 minutes left in session, patient refused any further engagement with clinician. Patient was left in room with call bell in reach and alarm set. SLP will continue to target goals per plan of care.        Pain Pain Assessment Pain  Scale: 0-10 Pain Score: 0-No pain  Therapy/Group: Individual Therapy  Karter Haire, M.A., CCC-SLP  Berdene Askari A [place] Dowd 02/09/2024, 2:57 PM

## 2024-02-09 NOTE — Progress Notes (Signed)
 PROGRESS NOTE   Subjective/Complaints:  Patient reported to be lethargic this AM with therapies.  BP 104-140 SBP overnight but down to 80s/50s with mobility this AM.  Cr slightly up from 0.8 to 1.1.  Feeling better, more alert this afternoon.  Does endorse some orthostatic symptoms with out of bed.   ROS: Denies fevers, chills, N/V, abdominal pain, nausea, constipation, diarrhea, SOB, cough, chest pain, new weakness or paraesthesias.   Insomnia   Objective:   No results found. Recent Labs    02/09/24 0454  WBC 9.0  HGB 10.7*  HCT 32.0*  PLT 292   Recent Labs    02/08/24 0511 02/09/24 0454  NA 142 140  K 3.8 3.9  CL 108 105  CO2 24 23  GLUCOSE 178* 131*  BUN 19 18  CREATININE 1.08 1.10  CALCIUM  9.5 9.0    Intake/Output Summary (Last 24 hours) at 02/09/2024 0929 Last data filed at 02/09/2024 0700 Gross per 24 hour  Intake 480 ml  Output 800 ml  Net -320 ml        Physical Exam: Vital Signs Blood pressure (!) 104/57, pulse 79, temperature 97.9 F (36.6 C), temperature source Oral, resp. rate 18, height 6' (1.829 m), weight 76.9 kg, SpO2 98%.  Constitutional: No apparent distress. Appropriate appearance for age.  Laying in bed. HENT: No JVD. Neck Supple. Trachea midline. Atraumatic, normocephalic. Eyes: PERRLA. EOMI. Visual fields grossly intact.  Cardiovascular: RRR, no murmurs/rub/gallops. No Edema. Peripheral pulses 2+  Respiratory: Mild crackles and expiratory wheezes. On RA.  Abdomen: + bowel sounds, normoactive. No distention or tenderness.  GU: Not examined. +Foley, draining clear urine.  Skin: C/D/I. No apparent lesions.  MSK:      No apparent deformity.       Neurologic exam:  Cognition: AAO to person, place, time. + Mild to moderate cognitive deficits--waxes and wanes, worse first thing in the morning  Language: Fluent, No substitutions or neoglisms. No dysarthria.  Insight: Fair  insight into current condition.  Mood: Pleasant affect, appropriate mood.  Sensation: To light touch intact in BL UEs and LEs  Reflexes: 2+ in BL UE and LEs. Negative Hoffman's and babinski signs bilaterally.  CN: 2-12 grossly intact.  Coordination: Mild left upper and lower extremity ataxia Spasticity: MAS 0 in all extremities.        Strength: No appreciable change 10-28                RUE: 5/5 SA, 5/5 EF, 5/5 EE, 5/5 WE, 5/5 FF, 5/5 FA                LUE:  5-/5 SA, 5-/5 EF, 5-/5 EE, 5-/5 WE, 5-/5 FF, 5-/5 FA                RLE: 5/5 HF, 5/5 KE, 5/5  DF, 5/5  EHL, 5/5  PF                 LLE:  4-/5 HF, 4-/5 KE, 4/5  DF, 4/5  EHL, 4/5  PF    Assessment/Plan: 1. Functional deficits which require 3+ hours per day of interdisciplinary therapy in a comprehensive inpatient rehab  setting. Physiatrist is providing close team supervision and 24 hour management of active medical problems listed below. Physiatrist and rehab team continue to assess barriers to discharge/monitor patient progress toward functional and medical goals  Care Tool:  Bathing    Body parts bathed by patient: Right arm, Chest, Left arm, Abdomen, Face, Right upper leg, Left upper leg, Right lower leg, Left lower leg   Body parts bathed by helper: Front perineal area, Buttocks     Bathing assist Assist Level: Minimal Assistance - Patient > 75%     Upper Body Dressing/Undressing Upper body dressing   What is the patient wearing?: Pull over shirt    Upper body assist Assist Level: Moderate Assistance - Patient 50 - 74%    Lower Body Dressing/Undressing Lower body dressing      What is the patient wearing?: Pants, Incontinence brief     Lower body assist Assist for lower body dressing: Maximal Assistance - Patient 25 - 49%     Toileting Toileting    Toileting assist Assist for toileting: 2 Helpers     Transfers Chair/bed transfer  Transfers assist     Chair/bed transfer assist level: Moderate  Assistance - Patient 50 - 74%     Locomotion Ambulation   Ambulation assist      Assist level: 2 helpers Assistive device: Walker-rolling Max distance: 112'   Walk 10 feet activity   Assist     Assist level: 2 helpers Assistive device: Walker-rolling   Walk 50 feet activity   Assist    Assist level: 2 helpers Assistive device: Walker-rolling    Walk 150 feet activity   Assist Walk 150 feet activity did not occur: Safety/medical concerns (Max distance 112')         Walk 10 feet on uneven surface  activity   Assist Walk 10 feet on uneven surfaces activity did not occur: Safety/medical concerns         Wheelchair     Assist Is the patient using a wheelchair?: Yes Type of Wheelchair: Manual    Wheelchair assist level: Dependent - Patient 0%      Wheelchair 50 feet with 2 turns activity    Assist        Assist Level: Dependent - Patient 0%   Wheelchair 150 feet activity     Assist      Assist Level: Dependent - Patient 0%   Blood pressure (!) 104/57, pulse 79, temperature 97.9 F (36.6 C), temperature source Oral, resp. rate 18, height 6' (1.829 m), weight 76.9 kg, SpO2 98%.  1. Functional deficits secondary to acute pontine infarct             -patient may  shower             -ELOS/Goals: 9-12 days Min A to supervision - 11/1 DC              - stable to continue CIR  - 10/27: Will recheck U/A/culture, does not appear as oriented this AM as prior notes, Discussed with therapy- not felt to be off his usual baseline as he waxes and waves, nursing reports oriented x3 later in day, continue to monitor    - 10/28: Per therapies at cognitive baseline. Min A bed mobility - up to 300 ft with RW with Min A. Self-limiting and some cognitive issues. Setup UBD, L lateral lean with sitting and standing. Poor motivation /apathy huge. Some L inattention. Moderate cognitive deficits,  effected by self-limiting and  poor participation. Very  defiant behaviorally.   - Behavior plan placed by team  2.  Antithrombotics: -DVT/anticoagulation:  Pharmaceutical: Lovenox              -antiplatelet therapy: Aspirin  and  Plavix  until 10/28 then aspirin  alone--finished Plavix    3. Pain Management: Oxycodone  and Tylenol  as needed--well-controlled   4. Mood/Behavior/Sleep: LCSW to follow for evaluation and support when available.              -antipsychotic agents: Lexapro  10 mg daily - Delirium : Seroquel  25 mg nightly--tolerating well  --waxes/wanes - 10/28: Move seroquel  to PRN, add mirtazepine 15 mg at bedtime for sleep and mood 10/29: Poor sleep since removal of seroquel ; resume at 25 mg at bedtime 10/30: Overly sedated this a.m.  Reduce mirtazapine to 7.5 mg nightly.  Therapies adjusting for afternoon schedules   5. Neuropsych/cognition: This patient may be intermittently capable of making decisions on their own behalf.   6. Skin/Wound Care: Routine pressure relief measures             -Stage 2 Sacral wound--wound care orders placed--Gerhardt's butt cream +Zinc  PO   7. Fluids/Electrolytes/Nutrition: Monitor intake and output.  CBC/CMET in a.m.             - SLP consult             -GERD: Continue PPI   -10-25: Albumin 2.4.  Encourage p.o. intakes.  - BMP ordered 10-29 due to orthostasis, possible hypovolemia--a.m. labs stable.  10/30: Cr increase to 1.1; give 1L IVF today; BMP in AM   8.  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  Atorvastatin  80 mg daily.   9.  Acute metabolic encephalopathy: Secondary to aspiration pneumonia and hypotension.  Mentation improved continue to monitor.   10. Hypomagnesia: Mg 1.4<2.0<1.5< now 1.6 monitor and replace.    11.  Bladder outlet obstruction: Chronic foley catheter last changed 10/18             -continue CHG and foley catheter care.  - f/u with urology outpatient.    12.  HTN: resumed home Losartan  25 mg daily, monitor BP with increased  activity for hypotension    - 10-26: BP slightly elevated this a.m., back down this afternoon.  Monitor    -10/27 BP controlled, overall, continue current regimen   10-28: Orthostatic hypotension with therapies, severe.  Hold losartan  for tomorrow AM.  Encourage p.o. fluids today.  - 10/29: Hypertensive overnight but orthostatic with therapies consistnetly 10/30:  give 1L IVF today, teds, binder     13.  T2DM: A1c 8.5% on 10/7  -monitor cbgs ac/hs with Novolog  SSI and Lantus  15 units  -10-26: Blood sugars consistently elevated, increase Semglee  to 20 units daily -10/27 increase semglee  to 23 units - 10/28 appears semglee  was Dced?? Add back at 23 U -10-29: Low in a.m., apparent under coverage for meals.  Resume home metformin  500 mg twice daily.  May need more Premeal added. 10/30: BG better controlled Recent Labs    02/08/24 1623 02/08/24 2213 02/09/24 0641  GLUCAP 157* 187* 138*     14.CAD/HLD: Atorvastatin    15.  Severe sepsis due to aspiration RLL PNA: Initially treated with IV meropenem now transition to p.o. Augmentin  ends 10/24.  ID consulted, Dr. Fleeta Rothman recommended exchanging Foley due to fungemia.             - Aspiration precautions             -  Palliative care consult: Patient remains DNR limited   16. Cholelithiasis: Maybe chronic, no current GI symptoms. Continue to monitor.    17.  Heart block: S/p PPM    - Heart rate well-controlled, monitor    02/09/2024    4:57 AM 02/08/2024    7:49 PM 02/08/2024    2:20 PM  Vitals with BMI  Systolic 104 141 871  Diastolic 57 64 61  Pulse 79 60 59    18.  Constipation.  Last bowel movement 10-20.  - Start Senokot S1 tab twice daily   - Miralax  PRN--10-26 add to daily  - 10-26: Add as needed sorbitol ; would give if no bowel movement tonight  -10/27 LBM today, continue to monitor -- incontinent per therapies  Large bowel movement 10-30; incontinent LOS: 6 days A FACE TO FACE EVALUATION WAS PERFORMED  Joesph JAYSON Likes 02/09/2024, 9:29 AM

## 2024-02-09 NOTE — Progress Notes (Signed)
 Physical Therapy Session Note  Patient Details  Name: John [name] MRN: [id number] Date of Birth: 1946-02-28  Today's Date: 02/09/2024 PT Individual Time: 1005-1055 PT Individual Time Calculation (min): 50 min   Short Term Goals: Week 1:  PT Short Term Goal 1 (Week 1): Pt will perform supine<->sit with CGA PT Short Term Goal 2 (Week 1): Pt will ambulate 150' with RW min assist PT Short Term Goal 3 (Week 1): Pt will complete sit to stand with min assist consistently PT Short Term Goal 4 (Week 1): Pt will complete up/down 4 steps BHRs with CGA  Skilled Therapeutic Interventions/Progress Updates:    Pt presents in room in bed, pt asleep and lethargic throughout session. Pt does not report pain. Session largely focused on hemodynamic stability to elevate BP as pt demonstrating low BP in sitting, gait training deferred due to low BP and orthostatic hypotension at this time. Pt transported to day room dependently for time management and energy conservation. Pt completes sit to stand with heavy min assist to RW. Pt completes standing marches x20 alternating BLEs, max cues for elevated LLE off floor for exercise. Pt BP noted to drop following standing therex. Pt then completes continuous training on kinetron x6 min 40 cm/sec resistance, min assist for facilitation full ROM on LLE and mod cues for attention to task. BP reassessed then pt completes interval training on kinetron x10 min total, 30 sec work/30sec rest with min assist for facilitating LLE full ROM, completed to improve tolerance and participation with task. Pt returned to room due to increased fatigue, session terminated early with pt missing 10 min of 60 min session. Pt remains seated in WC in full tilt for BP management and safety with all needs within reach, cal light in place and chair alarm donned and activated at end of session.  Vitals: -Seated prior to activity BP 92/58 - seated following standing marches: BP 90/50 - following  kinetron x6 minn: BP 100/62  Therapy Documentation Precautions:  Precautions Precautions: Fall Precaution Booklet Issued: No Recall of Precautions/Restrictions: Impaired Precaution/Restrictions Comments: L hemiparesis Restrictions Weight Bearing Restrictions Per Provider Order: No General: PT Amount of Missed Time (min): 10 Minutes PT Missed Treatment Reason: Patient fatigue (difficulty maintaining alertness for participation with session)    Therapy/Group: Individual Therapy  Reche Ohara PT, DPT 02/09/2024, 4:44 PM

## 2024-02-10 DIAGNOSIS — I639 Cerebral infarction, unspecified: Secondary | ICD-10-CM | POA: Diagnosis not present

## 2024-02-10 LAB — URINE CULTURE: Culture: 70000 — AB

## 2024-02-10 LAB — GLUCOSE, CAPILLARY
Glucose-Capillary: 88 mg/dL (ref 70–99)
Glucose-Capillary: 164 mg/dL — ABNORMAL HIGH (ref 70–99)
Glucose-Capillary: 191 mg/dL — ABNORMAL HIGH (ref 70–99)
Glucose-Capillary: 148 mg/dL — ABNORMAL HIGH (ref 70–99)

## 2024-02-10 MED ORDER — DIPHENHYDRAMINE HCL 25 MG PO CAPS
25.0000 mg | ORAL_CAPSULE | Freq: Every evening | ORAL | Status: DC | PRN
  Administered 2024-02-12 (×2): 25 mg via ORAL
  Filled 2024-02-10 (×2): qty 1

## 2024-02-10 NOTE — Progress Notes (Signed)
 Occupational Therapy Weekly Progress Note  Patient Details  Name: John [name] MRN: [id number] Date of Birth: 1946-03-26  Beginning of progress report period: February 04, 2024 End of progress report period: February 10, 2024  Today's Date: 02/10/2024 OT Individual Time: 1350-1500 OT Individual Time Calculation (min): 70 min    Patient has met 3 of 4 short term goals (of note goals set at Max A). In supported sitting, pt completes UB care with setup/supervision (increased time) and overall Mod A for LB care. Pt continues to present with fluctuation in terms of levels of assistance needed and motivation for participation. Plan for caregiver education during week of 11/3.   Patient continues to demonstrate the following deficits: muscle weakness and muscle joint tightness, decreased cardiorespiratoy endurance, impaired timing and sequencing, unbalanced muscle activation, decreased coordination, and decreased motor planning, decreased initiation, decreased attention, decreased awareness, decreased problem solving, decreased safety awareness, decreased memory, and delayed processing, and decreased sitting balance, decreased standing balance, decreased postural control, hemiplegia, and decreased balance strategies and therefore will continue to benefit from skilled OT intervention to enhance overall performance with BADL and Reduce care partner burden.  Patient progressing toward long term goals. (SET AT MIN A)  Continue plan of care.  OT Short Term Goals Week 1:  OT Short Term Goal 1 (Week 1): Pt will complete LB dressing at Max A with AE as necessary OT Short Term Goal 1 - Progress (Week 1): Met OT Short Term Goal 2 (Week 1): Pt will complete toilet transfer at Max A with LRAD OT Short Term Goal 2 - Progress (Week 1): Met OT Short Term Goal 3 (Week 1): Pt will complete UB dressing with min A OT Short Term Goal 3 - Progress (Week 1): Met OT Short Term Goal 4 (Week 1): Pt will demo improved  awareness to deficits by identifying one deficit with mod vc OT Short Term Goal 4 - Progress (Week 1): Not met Week 2:  OT Short Term Goal 1 (Week 2): Pt will complete full-body bathing with Min A. OT Short Term Goal 2 (Week 2): Pt will complete 3/3 toileting tasks with Min A. OT Short Term Goal 3 (Week 2): Pt will complete ambulatory transfers with CGA + LRAD.  Skilled Therapeutic Interventions/Progress Updates:  Pt greeted resting in TIS WC, no reports of pain. Pt dependently transported to main therapy. In therapy area, pt instructed in series of activity to target standing tolerance/balance, activity tolerance, and LUE/LLE NMR, details below: 1x5 sit<>stands (warm-up activity), CGA + RW 2x10 alternating toe taps, Min A + RW, x1 posterior LOB onto WC 2x8 alternating toe taps, crossing midline diagonally, Min A + RW ~3 mins of modified soccer activity, focused on coordination of LLE to kick soft ball ~3 mins of ball toss onto ball rebounder X2 rounds of corn hole game using LUE to toss bean bags  Pt performs ambulating transfer from TIS placed at foot of bed to [facility] with CGA-Min A+ RW, able to independently bring BLE into bed. Pt remained resting in bed with all immediate needs met, door open and call bell within reach.   Therapy Documentation Precautions:  Precautions Precautions: Fall Precaution Booklet Issued: No Recall of Precautions/Restrictions: Impaired Precaution/Restrictions Comments: L hemiparesis Restrictions Weight Bearing Restrictions Per Provider Order: No   Therapy/Group: Individual Therapy  [name], OTR/L, MSOT  02/10/2024, 7:20 AM

## 2024-02-10 NOTE — Plan of Care (Signed)
 Problem: Consults Goal: RH GENERAL PATIENT EDUCATION Description: See Patient Education module for education specifics. Outcome: Progressing   Problem: RH BOWEL ELIMINATION Goal: RH STG MANAGE BOWEL WITH ASSISTANCE Description: STG Manage Bowel with supervision Assistance. Outcome: Progressing   Problem: RH BLADDER ELIMINATION Goal: RH STG MANAGE BLADDER WITH ASSISTANCE Description: STG Manage Bladder With supervision  Assistance Outcome: Progressing   Problem: RH SKIN INTEGRITY Goal: RH STG SKIN FREE OF INFECTION/BREAKDOWN Description: Manage skin free of infection with supervision assistance Outcome: Progressing   Problem: RH SAFETY Goal: RH STG ADHERE TO SAFETY PRECAUTIONS W/ASSISTANCE/DEVICE Description: STG Adhere to Safety Precautions With supervision  Assistance/Device. Outcome: Progressing   Problem: RH PAIN MANAGEMENT Goal: RH STG PAIN MANAGED AT OR BELOW PT'S PAIN GOAL Description: <4 w/ prns Outcome: Progressing   Problem: RH KNOWLEDGE DEFICIT GENERAL Goal: RH STG INCREASE KNOWLEDGE OF SELF CARE AFTER HOSPITALIZATION Description: Manage increase  knowledge of self care after hospitalization with supervision assistance from wife using educational materials provided Outcome: Progressing

## 2024-02-10 NOTE — Progress Notes (Signed)
 Met with patient's wife to review current situation, team conference and plan of care. Wife claims she is concern about patient condition, all questions answered at this time. Per wife  patient usually sleeps through the morning and wakes up after lunch. She usually schedules his MD appointment after lunch. Continue to follow along to provide educational needs to facilitate preparation for discharge.

## 2024-02-10 NOTE — Progress Notes (Signed)
 Speech Language Pathology Weekly Progress and Session Note  Patient Details  Name: John [name] MRN: [id number] Date of Birth: 1945/06/24  Beginning of progress report period: February 04, 2024 End of progress report period: February 10, 2024  Today's Date: 02/10/2024 SLP Individual Time: 0920-1000 SLP Individual Time Calculation (min): 40 min and Today's Date: 02/10/2024 SLP Missed Time: 20 Minutes Missed Time Reason: Patient fatigue;Patient unwilling to participate  Short Term Goals: Week 1: SLP Short Term Goal 1 (Week 1): Patient will utilize WRAP memory strategies to recall daily information given maxA SLP Short Term Goal 1 - Progress (Week 1): Not met SLP Short Term Goal 2 (Week 1): During speech therapy tasks, patient will sustain attention for intervals given maxA SLP Short Term Goal 2 - Progress (Week 1): Met SLP Short Term Goal 3 (Week 1): Patient will orient to time using external aids given modA SLP Short Term Goal 3 - Progress (Week 1): Not met SLP Short Term Goal 4 (Week 1): Patient will state 2 cognitive deficits given maxA SLP Short Term Goal 4 - Progress (Week 1): Not met    New Short Term Goals: Week 2: SLP Short Term Goal 1 (Week 2): STGs = LTGs d/t ELOS  Weekly Progress Updates: Very limited progress noted this week d/t pt participation and behavior. Minimal to no participation in tx remains. He continues to demonstrate severe to profound memory, orientation, and awareness deficits, severe problem solving deficits, and moderate attention deficits. Pt/family education ongoing. He would benefit from continued ST to target communication, maximize pt independence, and reduce caregiver burden. Prognosis remains guarded given pt's limited frustration tolerance and lack of awareness. Also concerned for his wife's ability to care for him in the home environment d/t this.   Intensity:  minimum of 1-2 x/day, 30 to 90 mins Frequency:  3 to 5 out of 7  days Duration/Length of Stay:  11/8 Treatment/Interventions:  Cognitive remediation/compensation; Environmental controls; Financial trader; Therapeutic Activities; Functional tasks; Internal/external aids; Therapeutic Exercise; Patient/family education    Daily Session  Skilled Therapeutic Interventions:  Missed initial 20 mins of tx targeting cognition d/t pt fatigue/refusal to wake. SLP was able to eventually wake pt by turning the lights on and repositioning him. He was assisted w/ set up of breakfast tray, requiring maxA vsiual/verbal cues for initiation and problem solving. Of note, pt requested pancakes cut in half, but then consumed 1/2 a pancake in one bite. He became annoyed when SLP further cut up his sausage/pancake, requiring education re appropriate bite size. Pt's nurse arrived for medication administration. He required modA to initiate responses to SLP and nursing, despite being awake/alert. He was provided orientation information via errorless learning and then left in bed w/ the alarm set and call light within reach. Recommend cont ST per POC.      Pain Pain Assessment Pain Scale: 0-10 Pain Score: 0-No painNo pain reported   Therapy/Group: Individual Therapy  [name] 02/10/2024, 12:49 PM

## 2024-02-10 NOTE — Progress Notes (Signed)
 Physical Therapy Weekly Progress Note  Patient Details  Name: John [name] MRN: [id number] Date of Birth: [birth year]  Beginning of progress report period: February 04, 2024 End of progress report period: February 10, 2024  Today's Date: 02/10/2024 PT Individual Time: 1051-1206 PT Individual Time Calculation (min): 75 min   Patient has met 0 of 4 short term goals.  Pt making little to no progress towards functional goals, progress limited by significant lethargy and decreased motivation to participate with therapies as well as orthostatic hypotension. Pt completes bed mobility with mod to max assist. Pt completes transfers with min/mod assist with RW. Pt completes gait up to 60' with RW min assist due to increased fatigue. Pt has not been able to complete stairs in recent days due to lethargy. This therapist has concerns for pt wife to be able to provide care pt will need at DC, pt would benefit from family meeting prior to DC to address care needs with pt wife.  Patient continues to demonstrate the following deficits muscle weakness, decreased cardiorespiratoy endurance, unbalanced muscle activation, decreased coordination, and decreased motor planning, decreased initiation, decreased attention, decreased awareness, decreased problem solving, decreased safety awareness, decreased memory, and delayed processing, and decreased sitting balance, decreased standing balance, decreased postural control, hemiplegia, and decreased balance strategies and therefore will continue to benefit from skilled PT intervention to increase functional independence with mobility.  Patient not progressing toward long term goals.  See goal revision..     PT Short Term Goals Week 1:  PT Short Term Goal 1 (Week 1): Pt will perform supine<->sit with CGA PT Short Term Goal 1 - Progress (Week 1): Progressing toward goal PT Short Term Goal 2 (Week 1): Pt will ambulate 150' with RW min assist PT Short Term Goal 2 - Progress  (Week 1): Progressing toward goal PT Short Term Goal 3 (Week 1): Pt will complete sit to stand with min assist consistently PT Short Term Goal 3 - Progress (Week 1): Progressing toward goal PT Short Term Goal 4 (Week 1): Pt will complete up/down 4 steps BHRs with CGA PT Short Term Goal 4 - Progress (Week 1): Progressing toward goal Week 2:  PT Short Term Goal 1 (Week 2): STG = LTG due to ELOS  Skilled Therapeutic Interventions/Progress Updates:    Pt presents in room, asleep, difficult to awaken. Pt lethargic throughout session with decreased alertness, requires max to mod cues for attendance to task throughout session. Session focused on therapeutic activities to facilitate alertness, self care tasks, bed mobility, transfer training, upright tolerance, and activity tolerance. Therapist dons TED hose total assist for hemodynamic stability with transitional movements. Pt completes bed mobility with mod assist and max cues and encouragement for attention to task. Pt requires max assist for upper and lower body dressing seated EOB due to pt LOB posteriorly. Pt requires min assist for seated balance. Pt completes sit to stand with mod assist to RW, completes stand step transfer with RW min assist. Pt transported to day room dependenlty via TIS. Pt completes interval training seated in WC on kinetron 30 sec on/30 sec off x10 min total with min assist to achieve full ROM LLE. Pt completes ambulation x60' with minA demonstrating poor LLE foot clearance and increased Lt lateral lean with fatigue. Returns to sitting for seated rest break. Vitals assessed to be WNL. Pt requesting urgent need to use restroom. Pt returned to room dependenlty via WC and pt ambulates with RW, increased time into bathroom with min assist.  Pt completes toilet transfer with min assist and requires min/mod assist for postural stability while therapist managing pants and brief. Pt noted to be incontinent of bowel in brief as well as continent  in toilet, charted. Pt able to complete periarea hygiene with min/mod assist for postural stability, therapist providing mod assist for managing pants with pt in standing. Pt ambulates back to [facility] and returns to sitting where he remains in reclined position with chair alarm donned and activated, all needs within reach, and nurse tech at bedside at end of session.  Vitals: Sitting following transfer: BP 99/65 Sitting after 60' walk: BP 127/64  Therapy Documentation Precautions:  Precautions Precautions: Fall Precaution Booklet Issued: No Recall of Precautions/Restrictions: Impaired Precaution/Restrictions Comments: L hemiparesis Restrictions Weight Bearing Restrictions Per Provider Order: No   Therapy/Group: Individual Therapy  Reche Ohara PT, DPT 02/10/2024, 12:53 PM

## 2024-02-10 NOTE — Progress Notes (Signed)
 PROGRESS NOTE   Subjective/Complaints:  Patient seemed at approximately 10 AM, starting therapies, feeling groggy and not woken up yet.  He states he had difficulty sleeping last night, felt itchy as he was falling asleep, which has occurred once before but generally does not.  No other complaints today.  Denies any symptoms of orthostasis.  Blood pressure was soft on exam, 90s over 50s.  Other vital stable.  Urinalysis equivalent to the last 1 on 10-18.  No fevers, chills, or leukocytosis to indicate infection.  ROS: Denies fevers, chills, N/V, abdominal pain, nausea, constipation, diarrhea, SOB, cough, chest pain, new weakness or paraesthesias.   Insomnia   Objective:   No results found. Recent Labs    02/09/24 0454  WBC 9.0  HGB 10.7*  HCT 32.0*  PLT 292   Recent Labs    02/08/24 0511 02/09/24 0454  NA 142 140  K 3.8 3.9  CL 108 105  CO2 24 23  GLUCOSE 178* 131*  BUN 19 18  CREATININE 1.08 1.10  CALCIUM  9.5 9.0    Intake/Output Summary (Last 24 hours) at 02/10/2024 1256 Last data filed at 02/10/2024 1059 Gross per 24 hour  Intake 620 ml  Output 1600 ml  Net -980 ml        Physical Exam: Vital Signs Blood pressure (!) 103/54, pulse 60, temperature 97.7 F (36.5 C), resp. rate 18, height 6' (1.829 m), weight 76.9 kg, SpO2 96%.  Constitutional: No apparent distress. Appropriate appearance for age.  Sitting up in bedside chair. HENT: No JVD. Neck Supple. Trachea midline. Atraumatic, normocephalic. Eyes: PERRLA. EOMI. Visual fields grossly intact.  Cardiovascular: RRR, no murmurs/rub/gallops. No Edema. Peripheral pulses 2+  Respiratory: Clear to auscultation bilaterally.  No rales, rhonchi, or wheezing.. On RA.  Abdomen: + bowel sounds, normoactive. No distention or tenderness.  GU: Not examined. +Foley, draining clear urine.  Skin: C/D/I. No apparent lesions.  MSK:      No apparent deformity.        Neurologic exam:  Cognition: AAO to person, place, time. + Mild to moderate cognitive deficits--waxes and wanes, consistently more lethargic/confused in the morning  Language: Fluent, No substitutions or neoglisms.  Mild dysarthria. Insight: Fair insight into current condition.  Mood: Pleasant affect, appropriate mood.  Sensation: To light touch intact in BL UEs and LEs  Reflexes: 2+ in BL UE and LEs. Negative Hoffman's and babinski signs bilaterally.  CN: 2-12 grossly intact.  Coordination: Mild left upper and lower extremity ataxia--stable Spasticity: MAS 0 in all extremities.        Strength: Some delayed responses this a.m., secondary lethargy                RUE: 5/5 SA, 5/5 EF, 5/5 EE, 5/5 WE, 5/5 FF, 5/5 FA                LUE:  5-/5 SA, 5-/5 EF, 5-/5 EE, 5-/5 WE, 5-/5 FF, 5-/5 FA                RLE: 4/5 HF, 4/5 KE,4 /5  DF, 4/5  EHL, 4/5  PF  LLE:  4-/5 HF, 4-/5 KE, 4/5  DF, 4/5  EHL, 4/5  PF    Assessment/Plan: 1. Functional deficits which require 3+ hours per day of interdisciplinary therapy in a comprehensive inpatient rehab setting. Physiatrist is providing close team supervision and 24 hour management of active medical problems listed below. Physiatrist and rehab team continue to assess barriers to discharge/monitor patient progress toward functional and medical goals  Care Tool:  Bathing    Body parts bathed by patient: Right arm, Chest, Left arm, Abdomen, Face, Right upper leg, Left upper leg, Right lower leg, Left lower leg   Body parts bathed by helper: Front perineal area, Buttocks     Bathing assist Assist Level: Minimal Assistance - Patient > 75%     Upper Body Dressing/Undressing Upper body dressing   What is the patient wearing?: Pull over shirt    Upper body assist Assist Level: Moderate Assistance - Patient 50 - 74%    Lower Body Dressing/Undressing Lower body dressing      What is the patient wearing?: Pants, Incontinence  brief     Lower body assist Assist for lower body dressing: Maximal Assistance - Patient 25 - 49%     Toileting Toileting    Toileting assist Assist for toileting: 2 Helpers     Transfers Chair/bed transfer  Transfers assist     Chair/bed transfer assist level: Moderate Assistance - Patient 50 - 74%     Locomotion Ambulation   Ambulation assist      Assist level: 2 helpers Assistive device: Walker-rolling Max distance: 112'   Walk 10 feet activity   Assist     Assist level: 2 helpers Assistive device: Walker-rolling   Walk 50 feet activity   Assist    Assist level: 2 helpers Assistive device: Walker-rolling    Walk 150 feet activity   Assist Walk 150 feet activity did not occur: Safety/medical concerns (Max distance 112')         Walk 10 feet on uneven surface  activity   Assist Walk 10 feet on uneven surfaces activity did not occur: Safety/medical concerns         Wheelchair     Assist Is the patient using a wheelchair?: Yes Type of Wheelchair: Manual    Wheelchair assist level: Dependent - Patient 0%      Wheelchair 50 feet with 2 turns activity    Assist        Assist Level: Dependent - Patient 0%   Wheelchair 150 feet activity     Assist      Assist Level: Dependent - Patient 0%   Blood pressure (!) 103/54, pulse 60, temperature 97.7 F (36.5 C), resp. rate 18, height 6' (1.829 m), weight 76.9 kg, SpO2 96%.  1. Functional deficits secondary to acute pontine infarct             -patient may  shower             -ELOS/Goals: 9-12 days Min A to supervision - 11/1 DC              - stable to continue CIR  - 10/27: Will recheck U/A/culture, does not appear as oriented this AM as prior notes, Discussed with therapy- not felt to be off his usual baseline as he waxes and waves, nursing reports oriented x3 later in day, continue to monitor    - 10/28: Per therapies at cognitive baseline. Min A bed mobility - up  to 300 ft  with RW with Min A. Self-limiting and some cognitive issues. Setup UBD, L lateral lean with sitting and standing. Poor motivation /apathy huge. Some L inattention. Moderate cognitive deficits,  effected by self-limiting and poor participation. Very defiant behaviorally.   - Behavior plan placed by team  - Therapy teams planning therapy schedule in the late morning and early afternoon due to consistent a.m. lethargy and confusion; wife states this is consistent with how he is at home, generally does not wake up before lunch.  2.  Antithrombotics: -DVT/anticoagulation:  Pharmaceutical: Lovenox              -antiplatelet therapy: Aspirin  and  Plavix  until 10/28 then aspirin  alone--finished Plavix    3. Pain Management: Oxycodone  and Tylenol  as needed--well-controlled   4. Mood/Behavior/Sleep: LCSW to follow for evaluation and support when available.              -antipsychotic agents: Lexapro  10 mg daily - Delirium : Seroquel  25 mg nightly--tolerating well  --waxes/wanes - 10/28: Move seroquel  to PRN, add mirtazepine 15 mg at bedtime for sleep and mood 10/29: Poor sleep since removal of seroquel ; resume at 25 mg at bedtime 10/30: Overly sedated this a.m.  Reduce mirtazapine to 7.5 mg nightly.  Therapies adjusting for afternoon schedules 10-31: Again, increasingly lethargic into the later afternoon.  DC Seroquel , continue mirtazapine 7.5 mg nightly, change as needed to Benadryl  25 mg given patient reports of itching sensation as he falls asleep without objective rashes.   5. Neuropsych/cognition: This patient may be intermittently capable of making decisions on their own behalf.   6. Skin/Wound Care: Routine pressure relief measures             -Stage 2 Sacral wound--wound care orders placed--Gerhardt's butt cream +Zinc  PO   7. Fluids/Electrolytes/Nutrition: Monitor intake and output.  CBC/CMET in a.m.             - SLP consult             -GERD: Continue PPI   -10-25: Albumin 2.4.   Encourage p.o. intakes.  - BMP ordered 10-29 due to orthostasis, possible hypovolemia--a.m. labs stable.  10/30: Cr increase to 1.1; give 1L IVF today; BMP in AM   10-31: BMP pending, encouraging p.o. fluids  8.  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  Atorvastatin  80 mg daily.   9.  Acute metabolic encephalopathy: Secondary to aspiration pneumonia and hypotension.  Mentation improved continue to monitor.   10. Hypomagnesia: Mg 1.4<2.0<1.5< now 1.6 monitor and replace.    11.  Bladder outlet obstruction: Chronic foley catheter last changed 10/18             -continue CHG and foley catheter care.  - f/u with urology outpatient.    12.  HTN: resumed home Losartan  25 mg daily, monitor BP with increased activity for hypotension    - 10-26: BP slightly elevated this a.m., back down this afternoon.  Monitor    -10/27 BP controlled, overall, continue current regimen   10-28: Orthostatic hypotension with therapies, severe.  Hold losartan  for tomorrow AM.  Encourage p.o. fluids today.  - 10/29: Hypertensive overnight but orthostatic with therapies consistnetly 10/30:  give 1L IVF today, teds, binder   10-31: BP slightly improved, remains low out of bed with teds, patient asymptomatic.  Encouraging p.o. fluids, may need additional bolus  13.  T2DM: A1c 8.5% on 10/7  -monitor cbgs ac/hs with Novolog  SSI and Lantus  15 units  -  10-26: Blood sugars consistently elevated, increase Semglee  to 20 units daily -10/27 increase semglee  to 23 units - 10/28 appears semglee  was Dced?? Add back at 23 U -10-29: Low in a.m., apparent under coverage for meals.  Resume home metformin  500 mg twice daily.  May need more Premeal added. 10/30: BG better controlled with current regimen, monitor Recent Labs    02/09/24 2124 02/10/24 0642 02/10/24 1207  GLUCAP 194* 88 164*     14.CAD/HLD: Atorvastatin    15.  Severe sepsis due to aspiration RLL PNA: Initially treated  with IV meropenem now transition to p.o. Augmentin  ends 10/24.  ID consulted, Dr. Fleeta Rothman recommended exchanging Foley due to fungemia.             - Aspiration precautions             - Palliative care consult: Patient remains DNR limited   16. Cholelithiasis: Maybe chronic, no current GI symptoms. Continue to monitor.    17.  Heart block: S/p PPM    - Heart rate well-controlled, monitor    02/10/2024    5:41 AM 02/09/2024    8:15 PM 02/09/2024    2:13 PM  Vitals with BMI  Systolic 103 131 897  Diastolic 54 64 53  Pulse 60 64 65    18.  Constipation/neurogenic bowel.  Last bowel movement 10-20.  Wife endorses after his last stroke, he was incontinent, improved after leaving hospital.  - Start Senokot S1 tab twice daily   - Miralax  PRN--10-26 add to daily  - 10-26: Add as needed sorbitol ; would give if no bowel movement tonight  -10/27 LBM today, continue to monitor -- incontinent per therapies  Large bowel movement 10-31, incontinent.  Continue timed toileting when getting up out of bed.  LOS: 7 days A FACE TO FACE EVALUATION WAS PERFORMED  John [name] 02/10/2024, 12:56 PM

## 2024-02-10 NOTE — Plan of Care (Signed)
  Problem: RH Balance Goal: LTG Patient will maintain dynamic sitting balance (PT) Description: LTG:  Patient will maintain dynamic sitting balance with assistance during mobility activities (PT) Flowsheets (Taken 02/10/2024 1558) LTG: Pt will maintain dynamic sitting balance during mobility activities with:: Contact Guard/Touching assist Note: Downgraded due to progress up to this point Goal: LTG Patient will maintain dynamic standing balance (PT) Description: LTG:  Patient will maintain dynamic standing balance with assistance during mobility activities (PT) Flowsheets (Taken 02/10/2024 1558) LTG: Pt will maintain dynamic standing balance during mobility activities with:: Contact Guard/Touching assist Note: Downgraded due to progress up to this point   Problem: Sit to Stand Goal: LTG:  Patient will perform sit to stand with assistance level (PT) Description: LTG:  Patient will perform sit to stand with assistance level (PT) Flowsheets (Taken 02/10/2024 1558) LTG: PT will perform sit to stand in preparation for functional mobility with assistance level: Contact Guard/Touching assist Note: Downgraded due to progress up to this point   Problem: RH Bed Mobility Goal: LTG Patient will perform bed mobility with assist (PT) Description: LTG: Patient will perform bed mobility with assistance, with/without cues (PT). Flowsheets (Taken 02/10/2024 1558) LTG: Pt will perform bed mobility with assistance level of: Contact Guard/Touching assist Note: Downgraded due to progress up to this point   Problem: RH Bed to Chair Transfers Goal: LTG Patient will perform bed/chair transfers w/assist (PT) Description: LTG: Patient will perform bed to chair transfers with assistance (PT). Flowsheets (Taken 02/10/2024 1558) LTG: Pt will perform Bed to Chair Transfers with assistance level: Contact Guard/Touching assist Note: Downgraded due to progress up to this point   Problem: RH Car Transfers Goal: LTG  Patient will perform car transfers with assist (PT) Description: LTG: Patient will perform car transfers with assistance (PT). Flowsheets (Taken 02/10/2024 1558) LTG: Pt will perform car transfers with assist:: Contact Guard/Touching assist Note: Downgraded due to progress up to this point   Problem: RH Ambulation Goal: LTG Patient will ambulate in controlled environment (PT) Description: LTG: Patient will ambulate in a controlled environment, # of feet with assistance (PT). Flowsheets (Taken 02/10/2024 1558) LTG: Pt will ambulate in controlled environ  assist needed:: Contact Guard/Touching assist Note: Downgraded due to progress up to this point Goal: LTG Patient will ambulate in home environment (PT) Description: LTG: Patient will ambulate in home environment, # of feet with assistance (PT). Flowsheets (Taken 02/10/2024 1558) LTG: Pt will ambulate in home environ  assist needed:: Contact Guard/Touching assist Note: Downgraded due to progress up to this point   Problem: RH Stairs Goal: LTG Patient will ambulate up and down stairs w/assist (PT) Description: LTG: Patient will ambulate up and down # of stairs with assistance (PT) Flowsheets (Taken 02/10/2024 1558) LTG: Pt will ambulate up/down stairs assist needed:: Minimal Assistance - Patient > 75% Note: Downgraded due to progress up to this point

## 2024-02-11 DIAGNOSIS — K5900 Constipation, unspecified: Secondary | ICD-10-CM | POA: Diagnosis not present

## 2024-02-11 DIAGNOSIS — I639 Cerebral infarction, unspecified: Secondary | ICD-10-CM | POA: Diagnosis not present

## 2024-02-11 DIAGNOSIS — E1169 Type 2 diabetes mellitus with other specified complication: Secondary | ICD-10-CM | POA: Diagnosis not present

## 2024-02-11 DIAGNOSIS — G4700 Insomnia, unspecified: Secondary | ICD-10-CM

## 2024-02-11 DIAGNOSIS — I10 Essential (primary) hypertension: Secondary | ICD-10-CM | POA: Diagnosis not present

## 2024-02-11 DIAGNOSIS — R7989 Other specified abnormal findings of blood chemistry: Secondary | ICD-10-CM

## 2024-02-11 LAB — GLUCOSE, CAPILLARY
Glucose-Capillary: 104 mg/dL — ABNORMAL HIGH (ref 70–99)
Glucose-Capillary: 75 mg/dL (ref 70–99)
Glucose-Capillary: 174 mg/dL — ABNORMAL HIGH (ref 70–99)
Glucose-Capillary: 184 mg/dL — ABNORMAL HIGH (ref 70–99)
Glucose-Capillary: 213 mg/dL — ABNORMAL HIGH (ref 70–99)

## 2024-02-11 LAB — BASIC METABOLIC PANEL WITH GFR
Anion gap: 11 (ref 5–15)
BUN: 18 mg/dL (ref 8–23)
CO2: 25 mmol/L (ref 22–32)
Calcium: 8.9 mg/dL (ref 8.9–10.3)
Chloride: 106 mmol/L (ref 98–111)
Creatinine, Ser: 1.08 mg/dL (ref 0.61–1.24)
GFR, Estimated: >60 mL/min (ref >60)
Glucose, Bld: 151 mg/dL — ABNORMAL HIGH (ref 70–99)
Potassium: 4.2 mmol/L (ref 3.5–5.1)
Sodium: 142 mmol/L (ref 135–145)

## 2024-02-11 NOTE — Progress Notes (Signed)
 PROGRESS NOTE   Subjective/Complaints: No new complaints this AM, reports he slept ok last night. Waiting on lunch.   ROS: Denies fevers, chills, N/V, abdominal pain, nausea, constipation, diarrhea, SOB, cough, chest pain, new weakness or paraesthesias.   Insomnia- improved   Objective:   No results found. Recent Labs    02/09/24 0454  WBC 9.0  HGB 10.7*  HCT 32.0*  PLT 292   Recent Labs    02/09/24 0454  NA 140  K 3.9  CL 105  CO2 23  GLUCOSE 131*  BUN 18  CREATININE 1.10  CALCIUM  9.0    Intake/Output Summary (Last 24 hours) at 02/11/2024 1224 Last data filed at 02/11/2024 0451 Gross per 24 hour  Intake 600 ml  Output 1500 ml  Net -900 ml        Physical Exam: Vital Signs Blood pressure (!) 137/59, pulse 62, temperature 98.5 F (36.9 C), temperature source Oral, resp. rate 18, height 6' (1.829 m), weight 76.9 kg, SpO2 97%.  Constitutional: No apparent distress. Appropriate appearance for age.  Laying in bed HENT: No JVD. Neck Supple. Trachea midline. Atraumatic, normocephalic. Eyes: PERRLA. EOMI. Visual fields grossly intact.  Cardiovascular: RRR, no murmurs/rub/gallops. No Edema. Peripheral pulses 2+  Respiratory: Clear to auscultation bilaterally.  No rales, rhonchi, or wheezing.. On RA.  Abdomen: + bowel sounds, normoactive. No distention or tenderness.  GU: Not examined. +Foley, draining clear urine.  Skin: C/D/I. No apparent lesions.  MSK:      No apparent deformity.       Neurologic exam:  Cognition: AAO to person, place, time. + Mild to moderate cognitive deficits--waxes and wanes, consistently more lethargic/confused in the morning  Language: Fluent, No substitutions or neoglisms.  Mild dysarthria. Insight: Fair insight into current condition.  Mood: Pleasant affect, appropriate mood.  Sensation: To light touch intact in BL UEs and LEs  Reflexes: 2+ in BL UE and LEs. Negative Hoffman's  and babinski signs bilaterally.  CN: 2-12 grossly intact.  Coordination: Mild left upper and lower extremity ataxia--stable Spasticity: MAS 0 in all extremities.        Strength: Some delayed responses this a.m., secondary lethargy                RUE: 5/5 SA, 5/5 EF, 5/5 EE, 5/5 WE, 5/5 FF, 5/5 FA                LUE:  5-/5 SA, 5-/5 EF, 5-/5 EE, 5-/5 WE, 5-/5 FF, 5-/5 FA                RLE: 4/5 HF, 4/5 KE,4 /5  DF, 4/5  EHL, 4/5  PF                 LLE:  4-/5 HF, 4-/5 KE, 4/5  DF, 4/5  EHL, 4/5  PF  Prior neuro assessment is c/w today's exam 02/11/2024.   Assessment/Plan: 1. Functional deficits which require 3+ hours per day of interdisciplinary therapy in a comprehensive inpatient rehab setting. Physiatrist is providing close team supervision and 24 hour management of active medical problems listed below. Physiatrist and rehab team continue to assess barriers to discharge/monitor  patient progress toward functional and medical goals  Care Tool:  Bathing    Body parts bathed by patient: Right arm, Chest, Left arm, Abdomen, Face, Right upper leg, Left upper leg, Right lower leg, Left lower leg   Body parts bathed by helper: Front perineal area, Buttocks     Bathing assist Assist Level: Minimal Assistance - Patient > 75%     Upper Body Dressing/Undressing Upper body dressing   What is the patient wearing?: Pull over shirt    Upper body assist Assist Level: Moderate Assistance - Patient 50 - 74%    Lower Body Dressing/Undressing Lower body dressing      What is the patient wearing?: Pants, Incontinence brief     Lower body assist Assist for lower body dressing: Maximal Assistance - Patient 25 - 49%     Toileting Toileting    Toileting assist Assist for toileting: 2 Helpers     Transfers Chair/bed transfer  Transfers assist     Chair/bed transfer assist level: Moderate Assistance - Patient 50 - 74%     Locomotion Ambulation   Ambulation assist      Assist  level: 2 helpers Assistive device: Walker-rolling Max distance: 112'   Walk 10 feet activity   Assist     Assist level: 2 helpers Assistive device: Walker-rolling   Walk 50 feet activity   Assist    Assist level: 2 helpers Assistive device: Walker-rolling    Walk 150 feet activity   Assist Walk 150 feet activity did not occur: Safety/medical concerns (Max distance 112')         Walk 10 feet on uneven surface  activity   Assist Walk 10 feet on uneven surfaces activity did not occur: Safety/medical concerns         Wheelchair     Assist Is the patient using a wheelchair?: Yes Type of Wheelchair: Manual    Wheelchair assist level: Dependent - Patient 0%      Wheelchair 50 feet with 2 turns activity    Assist        Assist Level: Dependent - Patient 0%   Wheelchair 150 feet activity     Assist      Assist Level: Dependent - Patient 0%   Blood pressure (!) 137/59, pulse 62, temperature 98.5 F (36.9 C), temperature source Oral, resp. rate 18, height 6' (1.829 m), weight 76.9 kg, SpO2 97%.  1. Functional deficits secondary to acute pontine infarct             -patient may  shower             -ELOS/Goals: 9-12 days Min A to supervision - 11/1 DC              - stable to continue CIR  - 10/27: Will recheck U/A/culture, does not appear as oriented this AM as prior notes, Discussed with therapy- not felt to be off his usual baseline as he waxes and waves, nursing reports oriented x3 later in day, continue to monitor    - 10/28: Per therapies at cognitive baseline. Min A bed mobility - up to 300 ft with RW with Min A. Self-limiting and some cognitive issues. Setup UBD, L lateral lean with sitting and standing. Poor motivation /apathy huge. Some L inattention. Moderate cognitive deficits,  effected by self-limiting and poor participation. Very defiant behaviorally.   - Behavior plan placed by team  - Therapy teams planning therapy schedule  in the late morning and early  afternoon due to consistent a.m. lethargy and confusion; wife states this is consistent with how he is at home, generally does not wake up before lunch.  -11/1 appears alert and awake this early afternoon, continue to monitor   2.  Antithrombotics: -DVT/anticoagulation:  Pharmaceutical: Lovenox              -antiplatelet therapy: Aspirin  and  Plavix  until 10/28 then aspirin  alone--finished Plavix    3. Pain Management: Oxycodone  and Tylenol  as needed--well-controlled   4. Mood/Behavior/Sleep: LCSW to follow for evaluation and support when available.              -antipsychotic agents: Lexapro  10 mg daily - Delirium : Seroquel  25 mg nightly--tolerating well  --waxes/wanes - 10/28: Move seroquel  to PRN, add mirtazepine 15 mg at bedtime for sleep and mood 10/29: Poor sleep since removal of seroquel ; resume at 25 mg at bedtime 10/30: Overly sedated this a.m.  Reduce mirtazapine to 7.5 mg nightly.  Therapies adjusting for afternoon schedules 10-31: Again, increasingly lethargic into the later afternoon.  DC Seroquel , continue mirtazapine 7.5 mg nightly, change as needed to Benadryl  25 mg given patient reports of itching sensation as he falls asleep without objective rashes. 11/1 reports slept better yesterday, doesn't appear her got prn bendryl   5. Neuropsych/cognition: This patient may be intermittently capable of making decisions on their own behalf.   6. Skin/Wound Care: Routine pressure relief measures             -Stage 2 Sacral wound--wound care orders placed--Gerhardt's butt cream +Zinc  PO   7. Fluids/Electrolytes/Nutrition: Monitor intake and output.  CBC/CMET in a.m.             - SLP consult             -GERD: Continue PPI   -10-25: Albumin 2.4.  Encourage p.o. intakes.  - BMP ordered 10-29 due to orthostasis, possible hypovolemia--a.m. labs stable.  10/30: Cr increase to 1.1; give 1L IVF today; BMP in AM   10-31: BMP pending, encouraging p.o.  fluids  11/1 azotemia, Recheck bmp- dont see it was completed yesterday  8.  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  Atorvastatin  80 mg daily.   9.  Acute metabolic encephalopathy: Secondary to aspiration pneumonia and hypotension.  Mentation improved continue to monitor.   10. Hypomagnesia: Mg 1.4<2.0<1.5< now 1.6 monitor and replace.    11.  Bladder outlet obstruction: Chronic foley catheter last changed 10/18             -continue CHG and foley catheter care.  - f/u with urology outpatient.    12.  HTN: resumed home Losartan  25 mg daily, monitor BP with increased activity for hypotension    - 10-26: BP slightly elevated this a.m., back down this afternoon.  Monitor    -10/27 BP controlled, overall, continue current regimen   10-28: Orthostatic hypotension with therapies, severe.  Hold losartan  for tomorrow AM.  Encourage p.o. fluids today.  - 10/29: Hypertensive overnight but orthostatic with therapies consistnetly 10/30:  give 1L IVF today, teds, binder   10-31: BP slightly improved, remains low out of bed with teds, patient asymptomatic.  Encouraging p.o. fluids, may need additional bolus  11/1 BP controlled overall, continue current regimen      02/11/2024    4:34 AM 02/11/2024   12:02 AM 02/10/2024   12:58 PM  Vitals with BMI  Systolic 137 134 888  Diastolic 59 59 78  Pulse  62 75 70     13.  T2DM: A1c 8.5% on 10/7  -monitor cbgs ac/hs with Novolog  SSI and Lantus  15 units  -10-26: Blood sugars consistently elevated, increase Semglee  to 20 units daily -10/27 increase semglee  to 23 units - 10/28 appears semglee  was Dced?? Add back at 23 U -10-29: Low in a.m., apparent under coverage for meals.  Resume home metformin  500 mg twice daily.  May need more Premeal added. 10/30: BG better controlled with current regimen, monitor 11/1 controlled continue current regimen Recent Labs    02/10/24 2216 02/11/24 0630 02/11/24 1200   GLUCAP 148* 104* 75     14.CAD/HLD: Atorvastatin    15.  Severe sepsis due to aspiration RLL PNA: Initially treated with IV meropenem now transition to p.o. Augmentin  ends 10/24.  ID consulted, Dr. Fleeta Rothman recommended exchanging Foley due to fungemia.             - Aspiration precautions             - Palliative care consult: Patient remains DNR limited   16. Cholelithiasis: Maybe chronic, no current GI symptoms. Continue to monitor.    17.  Heart block: S/p PPM    - Heart rate well-controlled, monitor    02/11/2024    4:34 AM 02/11/2024   12:02 AM 02/10/2024   12:58 PM  Vitals with BMI  Systolic 137 134 888  Diastolic 59 59 78  Pulse 62 75 70    18.  Constipation/neurogenic bowel.  Last bowel movement 10-20.  Wife endorses after his last stroke, he was incontinent, improved after leaving hospital.  - Start Senokot S1 tab twice daily   - Miralax  PRN--10-26 add to daily  - 10-26: Add as needed sorbitol ; would give if no bowel movement tonight  -10/27 LBM today, continue to monitor -- incontinent per therapies  Large bowel movement 10-31, incontinent.  Continue timed toileting when getting up out of bed. -11/1 LBM yesteray- continue to monitor   LOS: 8 days A FACE TO FACE EVALUATION WAS PERFORMED  Murray Collier 02/11/2024, 12:24 PM

## 2024-02-12 DIAGNOSIS — I639 Cerebral infarction, unspecified: Secondary | ICD-10-CM | POA: Diagnosis not present

## 2024-02-12 DIAGNOSIS — E1169 Type 2 diabetes mellitus with other specified complication: Secondary | ICD-10-CM | POA: Diagnosis not present

## 2024-02-12 DIAGNOSIS — K5900 Constipation, unspecified: Secondary | ICD-10-CM | POA: Diagnosis not present

## 2024-02-12 DIAGNOSIS — L299 Pruritus, unspecified: Secondary | ICD-10-CM

## 2024-02-12 DIAGNOSIS — I10 Essential (primary) hypertension: Secondary | ICD-10-CM | POA: Diagnosis not present

## 2024-02-12 LAB — GLUCOSE, CAPILLARY
Glucose-Capillary: 171 mg/dL — ABNORMAL HIGH (ref 70–99)
Glucose-Capillary: 154 mg/dL — ABNORMAL HIGH (ref 70–99)
Glucose-Capillary: 141 mg/dL — ABNORMAL HIGH (ref 70–99)
Glucose-Capillary: 171 mg/dL — ABNORMAL HIGH (ref 70–99)

## 2024-02-12 MED ORDER — CAMPHOR-MENTHOL 0.5-0.5 % EX LOTN
TOPICAL_LOTION | CUTANEOUS | Status: DC | PRN
  Filled 2024-02-12: qty 222

## 2024-02-12 NOTE — Plan of Care (Signed)
  Problem: Consults Goal: RH GENERAL PATIENT EDUCATION Description: See Patient Education module for education specifics. Outcome: Progressing   Problem: RH BOWEL ELIMINATION Goal: RH STG MANAGE BOWEL WITH ASSISTANCE Description: STG Manage Bowel with supervision Assistance. Outcome: Progressing

## 2024-02-12 NOTE — Progress Notes (Addendum)
 PROGRESS NOTE   Subjective/Complaints: Reports itching after chlorhexidine ? Or similar type wash last night.  Denies new rash.   ROS: Denies fevers, chills, N/V, abdominal pain, nausea, constipation, diarrhea, SOB, cough, chest pain, new weakness or paraesthesias.   Insomnia- improved\ Itching upper torso   Objective:   No results found. No results for input(s): WBC, HGB, HCT, PLT in the last 72 hours.  Recent Labs    02/11/24 1359  NA 142  K 4.2  CL 106  CO2 25  GLUCOSE 151*  BUN 18  CREATININE 1.08  CALCIUM  8.9    Intake/Output Summary (Last 24 hours) at 02/12/2024 1220 Last data filed at 02/12/2024 0720 Gross per 24 hour  Intake 720 ml  Output 1000 ml  Net -280 ml        Physical Exam: Vital Signs Blood pressure (!) 131/52, pulse 63, temperature 97.8 F (36.6 C), temperature source Oral, resp. rate 18, height 6' (1.829 m), weight 76.9 kg, SpO2 98%.  Constitutional: No apparent distress. Appropriate appearance for age.  Laying in bed HENT: No JVD. Neck Supple. Trachea midline. Atraumatic, normocephalic. Eyes: PERRLA. EOMI. Visual fields grossly intact.  Cardiovascular: RRR, no murmurs/rub/gallops. No Edema. Peripheral pulses 2+  Respiratory: Clear to auscultation bilaterally.  No rales, rhonchi, or wheezing.. On RA.  Abdomen: + bowel sounds, normoactive. No distention or tenderness.  GU: Not examined. +Foley, draining clear urine.  Skin: C/D/I. No apparent lesions or rashes noted.   MSK:      No apparent deformity.       Neurologic exam:  Cognition: AAO to person, place, time. + Mild to moderate cognitive deficits--waxes and wanes, consistently more lethargic/confused in the morning  Language: Fluent, No substitutions or neoglisms.  Mild dysarthria. Insight: Fair insight into current condition.  Mood: Pleasant affect, appropriate mood.  Sensation: To light touch intact in BL UEs and LEs   Reflexes: 2+ in BL UE and LEs. Negative Hoffman's and babinski signs bilaterally.  CN: 2-12 grossly intact.  Coordination: Mild left upper and lower extremity ataxia--stable Spasticity: MAS 0 in all extremities.        Strength: Some delayed responses this a.m., secondary lethargy                RUE: 5/5 SA, 5/5 EF, 5/5 EE, 5/5 WE, 5/5 FF, 5/5 FA                LUE:  5-/5 SA, 5-/5 EF, 5-/5 EE, 5-/5 WE, 5-/5 FF, 5-/5 FA                RLE: 4/5 HF, 4/5 KE,4 /5  DF, 4/5  EHL, 4/5  PF                 LLE:  4-/5 HF, 4-/5 KE, 4/5  DF, 4/5  EHL, 4/5  PF  Prior neuro assessment is c/w today's exam 02/12/2024.   Assessment/Plan: 1. Functional deficits which require 3+ hours per day of interdisciplinary therapy in a comprehensive inpatient rehab setting. Physiatrist is providing close team supervision and 24 hour management of active medical problems listed below. Physiatrist and rehab team continue to assess barriers to discharge/monitor  patient progress toward functional and medical goals  Care Tool:  Bathing    Body parts bathed by patient: Right arm, Chest, Left arm, Abdomen, Face, Right upper leg, Left upper leg, Right lower leg, Left lower leg   Body parts bathed by helper: Front perineal area, Buttocks     Bathing assist Assist Level: Minimal Assistance - Patient > 75%     Upper Body Dressing/Undressing Upper body dressing   What is the patient wearing?: Pull over shirt    Upper body assist Assist Level: Moderate Assistance - Patient 50 - 74%    Lower Body Dressing/Undressing Lower body dressing      What is the patient wearing?: Pants, Incontinence brief     Lower body assist Assist for lower body dressing: Maximal Assistance - Patient 25 - 49%     Toileting Toileting    Toileting assist Assist for toileting: 2 Helpers     Transfers Chair/bed transfer  Transfers assist     Chair/bed transfer assist level: Moderate Assistance - Patient 50 - 74%      Locomotion Ambulation   Ambulation assist      Assist level: 2 helpers Assistive device: Walker-rolling Max distance: 112'   Walk 10 feet activity   Assist     Assist level: 2 helpers Assistive device: Walker-rolling   Walk 50 feet activity   Assist    Assist level: 2 helpers Assistive device: Walker-rolling    Walk 150 feet activity   Assist Walk 150 feet activity did not occur: Safety/medical concerns (Max distance 112')         Walk 10 feet on uneven surface  activity   Assist Walk 10 feet on uneven surfaces activity did not occur: Safety/medical concerns         Wheelchair     Assist Is the patient using a wheelchair?: Yes Type of Wheelchair: Manual    Wheelchair assist level: Dependent - Patient 0%      Wheelchair 50 feet with 2 turns activity    Assist        Assist Level: Dependent - Patient 0%   Wheelchair 150 feet activity     Assist      Assist Level: Dependent - Patient 0%   Blood pressure (!) 131/52, pulse 63, temperature 97.8 F (36.6 C), temperature source Oral, resp. rate 18, height 6' (1.829 m), weight 76.9 kg, SpO2 98%.  1. Functional deficits secondary to acute pontine infarct             -patient may  shower             -ELOS/Goals: 9-12 days Min A to supervision - 11/1 DC              - stable to continue CIR  - 10/27: Will recheck U/A/culture, does not appear as oriented this AM as prior notes, Discussed with therapy- not felt to be off his usual baseline as he waxes and waves, nursing reports oriented x3 later in day, continue to monitor    - 10/28: Per therapies at cognitive baseline. Min A bed mobility - up to 300 ft with RW with Min A. Self-limiting and some cognitive issues. Setup UBD, L lateral lean with sitting and standing. Poor motivation /apathy huge. Some L inattention. Moderate cognitive deficits,  effected by self-limiting and poor participation. Very defiant behaviorally.   - Behavior  plan placed by team  - Therapy teams planning therapy schedule in the late morning and early  afternoon due to consistent a.m. lethargy and confusion; wife states this is consistent with how he is at home, generally does not wake up before lunch.  -11/1-2 appears alert and awake today, continue to monitor   2.  Antithrombotics: -DVT/anticoagulation:  Pharmaceutical: Lovenox              -antiplatelet therapy: Aspirin  and  Plavix  until 10/28 then aspirin  alone--finished Plavix    3. Pain Management: Oxycodone  and Tylenol  as needed--well-controlled   4. Mood/Behavior/Sleep: LCSW to follow for evaluation and support when available.              -antipsychotic agents: Lexapro  10 mg daily - Delirium : Seroquel  25 mg nightly--tolerating well  --waxes/wanes - 10/28: Move seroquel  to PRN, add mirtazepine 15 mg at bedtime for sleep and mood 10/29: Poor sleep since removal of seroquel ; resume at 25 mg at bedtime 10/30: Overly sedated this a.m.  Reduce mirtazapine to 7.5 mg nightly.  Therapies adjusting for afternoon schedules 10-31: Again, increasingly lethargic into the later afternoon.  DC Seroquel , continue mirtazapine 7.5 mg nightly, change as needed to Benadryl  25 mg given patient reports of itching sensation as he falls asleep without objective rashes. 11/1 reports slept better yesterday, doesn't appear her got prn bendryl   5. Neuropsych/cognition: This patient may be intermittently capable of making decisions on their own behalf.   6. Skin/Wound Care: Routine pressure relief measures             -Stage 2 Sacral wound--wound care orders placed--Gerhardt's butt cream +Zinc  PO   7. Fluids/Electrolytes/Nutrition: Monitor intake and output.  CBC/CMET in a.m.             - SLP consult             -GERD: Continue PPI   -10-25: Albumin 2.4.  Encourage p.o. intakes.  - BMP ordered 10-29 due to orthostasis, possible hypovolemia--a.m. labs stable.  10/30: Cr increase to 1.1; give 1L IVF today; BMP in  AM   10-31: BMP pending, encouraging p.o. fluids  11/2 rechecked BMP yesterday- Cr slightly down to 1.08, encourage oral fluids  8.  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  Atorvastatin  80 mg daily.   9.  Acute metabolic encephalopathy: Secondary to aspiration pneumonia and hypotension.  Mentation improved continue to monitor.   10. Hypomagnesia: Mg 1.4<2.0<1.5< now 1.6 monitor and replace.    11.  Bladder outlet obstruction: Chronic foley catheter last changed 10/18             -continue CHG and foley catheter care.  - f/u with urology outpatient.    12.  HTN: resumed home Losartan  25 mg daily, monitor BP with increased activity for hypotension    - 10-26: BP slightly elevated this a.m., back down this afternoon.  Monitor    -10/27 BP controlled, overall, continue current regimen   10-28: Orthostatic hypotension with therapies, severe.  Hold losartan  for tomorrow AM.  Encourage p.o. fluids today.  - 10/29: Hypertensive overnight but orthostatic with therapies consistnetly 10/30:  give 1L IVF today, teds, binder   10-31: BP slightly improved, remains low out of bed with teds, patient asymptomatic.  Encouraging p.o. fluids, may need additional bolus  11/2 BP controlled overall, continue current regimen      02/12/2024    4:50 AM 02/11/2024    8:46 PM 02/11/2024    2:04 PM  Vitals with BMI  Systolic 131 131 873  Diastolic 52 62 49  Pulse 63 77 66     13.  T2DM: A1c 8.5% on 10/7  -monitor cbgs ac/hs with Novolog  SSI and Lantus  15 units  -10-26: Blood sugars consistently elevated, increase Semglee  to 20 units daily -10/27 increase semglee  to 23 units - 10/28 appears semglee  was Dced?? Add back at 23 U -10-29: Low in a.m., apparent under coverage for meals.  Resume home metformin  500 mg twice daily.  May need more Premeal added. 10/30: BG better controlled with current regimen, monitor 11/2 fair control, continue current and  monitor Recent Labs    02/11/24 2125 02/12/24 0617 02/12/24 1141  GLUCAP 184* 171* 154*     14.CAD/HLD: Atorvastatin    15.  Severe sepsis due to aspiration RLL PNA: Initially treated with IV meropenem now transition to p.o. Augmentin  ends 10/24.  ID consulted, Dr. Fleeta Rothman recommended exchanging Foley due to fungemia.             - Aspiration precautions             - Palliative care consult: Patient remains DNR limited   16. Cholelithiasis: Maybe chronic, no current GI symptoms. Continue to monitor.    17.  Heart block: S/p PPM    - Heart rate well-controlled, monitor    02/12/2024    4:50 AM 02/11/2024    8:46 PM 02/11/2024    2:04 PM  Vitals with BMI  Systolic 131 131 873  Diastolic 52 62 49  Pulse 63 77 66    18.  Constipation/neurogenic bowel.  Last bowel movement 10-20.  Wife endorses after his last stroke, he was incontinent, improved after leaving hospital.  - Start Senokot S1 tab twice daily   - Miralax  PRN--10-26 add to daily  - 10-26: Add as needed sorbitol ; would give if no bowel movement tonight  -10/27 LBM today, continue to monitor -- incontinent per therapies  Large bowel movement 10-31, incontinent.  Continue timed toileting when getting up out of bed. -11/2 LBM yesteray- continue to monitor  19. Itching -Reports after he was given chlorhexadine or similar type product?, discussed nursing, will not use this today.  Chlorhexidine  was discontinued -Benedryl prn, sarna prn   LOS: 9 days A FACE TO FACE EVALUATION WAS PERFORMED  Murray Collier 02/12/2024, 12:20 PM

## 2024-02-13 DIAGNOSIS — I639 Cerebral infarction, unspecified: Secondary | ICD-10-CM | POA: Diagnosis not present

## 2024-02-13 LAB — BASIC METABOLIC PANEL WITH GFR
Anion gap: 13 (ref 5–15)
BUN: 14 mg/dL (ref 8–23)
CO2: 24 mmol/L (ref 22–32)
Calcium: 8.5 mg/dL — ABNORMAL LOW (ref 8.9–10.3)
Chloride: 102 mmol/L (ref 98–111)
Creatinine, Ser: 0.88 mg/dL (ref 0.61–1.24)
GFR, Estimated: >60 mL/min (ref >60)
Glucose, Bld: 154 mg/dL — ABNORMAL HIGH (ref 70–99)
Potassium: 3.6 mmol/L (ref 3.5–5.1)
Sodium: 139 mmol/L (ref 135–145)

## 2024-02-13 LAB — CBC
HCT: 33.3 % — ABNORMAL LOW (ref 39.0–52.0)
Hemoglobin: 10.9 g/dL — ABNORMAL LOW (ref 13.0–17.0)
MCH: 29.4 pg (ref 26.0–34.0)
MCHC: 32.7 g/dL (ref 30.0–36.0)
MCV: 89.8 fL (ref 80.0–100.0)
Platelets: 280 K/uL (ref 150–400)
RBC: 3.71 MIL/uL — ABNORMAL LOW (ref 4.22–5.81)
RDW: 16.6 % — ABNORMAL HIGH (ref 11.5–15.5)
WBC: 8.7 K/uL (ref 4.0–10.5)
nRBC: 0 % (ref 0.0–0.2)

## 2024-02-13 LAB — GLUCOSE, CAPILLARY
Glucose-Capillary: 144 mg/dL — ABNORMAL HIGH (ref 70–99)
Glucose-Capillary: 156 mg/dL — ABNORMAL HIGH (ref 70–99)
Glucose-Capillary: 218 mg/dL — ABNORMAL HIGH (ref 70–99)
Glucose-Capillary: 176 mg/dL — ABNORMAL HIGH (ref 70–99)

## 2024-02-13 NOTE — Progress Notes (Signed)
 Occupational Therapy Session Note  Patient Details  Name: John [name] MRN: [id number] Date of Birth: 1945/11/22  Today's Date: 02/13/2024 OT Individual Time: 8694-8654 OT Individual Time Calculation (min): 40 min    Short Term Goals: Week 2:  OT Short Term Goal 1 (Week 2): Pt will complete full-body bathing with Min A. OT Short Term Goal 2 (Week 2): Pt will complete 3/3 toileting tasks with Min A. OT Short Term Goal 3 (Week 2): Pt will complete ambulatory transfers with CGA + LRAD.  Skilled Therapeutic Interventions/Progress Updates:   Pt greeted sitting in TIS WC, reporting having incontinent BM. Intermediate reports of back pain, rest provided as needed. Pt completes 3/3 toileting tasks with Max A, standing with close supervision. LPN made aware of large loose BM. In therapy gym, pt instructed in functional mobility task targeting activity tolerance and reaching outside BOS. Pt completes 4x29ft reps of activity with CGA + RW.  Pt remained sitting in WC with all immediate needs met.   Therapy Documentation Precautions:  Precautions Precautions: Fall Precaution Booklet Issued: No Recall of Precautions/Restrictions: Impaired Precaution/Restrictions Comments: L hemiparesis Restrictions Weight Bearing Restrictions Per Provider Order: No   Therapy/Group: Individual Therapy  [name], OTR/L, MSOT  02/13/2024, 1:50 PM

## 2024-02-13 NOTE — Progress Notes (Signed)
 Speech Language Pathology Daily Session Note  Patient Details  Name: John [name] MRN: [id number] Date of Birth: 1945-11-14  Today's Date: 02/13/2024 SLP Individual Time: 8969-8884 and 1502-1600 SLP Individual Time Calculation (min): 45 min and 58 min  Short Term Goals: Week 2: SLP Short Term Goal 1 (Week 2): STGs = LTGs d/t ELOS  Skilled Therapeutic Interventions:  Session 1: SLP conducted skilled therapy session targeting cognition goals. Upon SLP entry, patient sitting in TIS eating breakfast and asked to recall activities completed throughout day. Patient initially expressed competing nothing, however SLP prompted patient to use therapy schedule to identify previous sessions and recall at least one activity completed with therapist. Patient modI for identifying prior therapy and activity using external aid. SLP conducted a mildly complex word finding task to target verbal reasoning and sustained attention. Patient initially ignored clinician despite clinician verbally stating task instructions and definition for first word repeatedly. Clinician educated patient on importance of speech therapy and patient participation to enhance cognitive skills while in CIR. Patient expressed that speech therapy is pointless with highly limited buy-in, requiring max encouragement to attempt any task engagement. Clinician explained to patient cognitive deficits and reasoning behind selected speech therapy tasks. Patient eventually agreeable to session and modI for verbal reasoning and sustained attention, further suggesting true assessment of deficits is difficult, as when patient applies himself, he performs well. It is likely, though clinician unable to state for sure given inconsistency of task engagement, that prior aforementioned cognitive deficits are secondary to lack of participation and unwillingness to apply himself to tasks provided. Will continue to assess. Patient was left in room with call bell  in reach and alarm set. SLP will continue to target goals per plan of care.    Session 2: SLP conducted skilled therapy session targeting cognition goals. With min encouragement from SLP, patient participated throughout all tasks. Patient complete moderately complex deductive reasoning task with min cues for task organization, min to mod cues for thoroughness, and min cues for deductive reasoning. He also benefited from increased processing time. When halfway through task and again near end of task, patient required extra encouragement to continue, stating I'm quitting!! But eventually persuaded to finish activity. Patient was left in room with call bell in reach and alarm set. SLP will continue to target goals per plan of care.        Pain  None reported  Therapy/Group: Individual Therapy  Ashley A Ellin 02/13/2024, 11:30 AM

## 2024-02-13 NOTE — Plan of Care (Signed)
  Problem: Consults Goal: RH GENERAL PATIENT EDUCATION Description: See Patient Education module for education specifics. Outcome: Progressing   

## 2024-02-13 NOTE — Progress Notes (Signed)
 Physical Therapy Session Note  Patient Details  Name: John [name] MRN: [id number] Date of Birth: Aug 27, 1945  Today's Date: 02/13/2024 PT Individual Time: 0935-1030 PT Individual Time Calculation (min): 55 min   Short Term Goals: Week 2:  PT Short Term Goal 1 (Week 2): STG = LTG due to ELOS  Skilled Therapeutic Interventions/Progress Updates:    Pt presents in room in bed, asleep, awakens to verbal cues however pt with increased lethargy throughout session, does not respond to cues verbally, able to nod head yes/no throughout session. Vitals WNL. Session focused on therapeutic activities to facilitate participation with self care tasks as well as activity tolerance and OOB tolerance. Pt with decreased seated balance, requires heavy mod assist for bed mobility, requires close supervision to max assist for seated balance as pt demonstrating posterior LOB requiring max assist to correct to upright despite cues for sequencing. Pt able to complete upper body dressing with min assist for postural stability. Therapist dons TED hose total assist, pants and shoes max assist with pt seated EOB. Pt completes sit to stand to RW with max assist with pt demonstrating heavy L lateral lean unable to correct with verbal cues. Pt requires max assist for pulling pants over hips in standing due to L lateral lean. Pt then completes stand step transfer with RW max assist for postural stability and hips to WC. Pt transported to day room and pt completes interval training on kinetron 30 sec work/30 sec rest for 10 minutes, pt able to initiate and maintain activity with minimal verbal cueing and pt able to complete full ROM with LLE without assist. Pt then completes x5 sit<>stands to RW with min progress to mod assist with fatigue, improved midline orientation however does continue to demonstrate slight L laternal lean and L knee flexion in standing. Pt then completes gait training 38' with RW min/mod assist with x2 LOB  secondary to poor LLE foot clearance and L lateral lean, cues provided with pt demonstrating decreasing foot clearance/step length with fatigue. Pt returns to room and remains seated in [facility] with all needs within reach, cal light in place and chair alarm donned and activated at end of session.   Vitals: BP 114/60, HR 60  Therapy Documentation Precautions:  Precautions Precautions: Fall Precaution Booklet Issued: No Recall of Precautions/Restrictions: Impaired Precaution/Restrictions Comments: L hemiparesis Restrictions Weight Bearing Restrictions Per Provider Order: No   Therapy/Group: Individual Therapy  Reche Ohara PT, DPT 02/13/2024, 12:31 PM

## 2024-02-13 NOTE — Progress Notes (Signed)
 PROGRESS NOTE   Subjective/Complaints:  A.m. labs stable, vital stable Patient resting on exam, awakes to endorse no current complaints.   ROS: Denies fevers, chills, N/V, abdominal pain, nausea, constipation, diarrhea, SOB, cough, chest pain, new weakness or paraesthesias.   Insomnia- improved   Objective:   No results found. Recent Labs    02/13/24 0442  WBC 8.7  HGB 10.9*  HCT 33.3*  PLT 280    Recent Labs    02/11/24 1359 02/13/24 0442  NA 142 139  K 4.2 3.6  CL 106 102  CO2 25 24  GLUCOSE 151* 154*  BUN 18 14  CREATININE 1.08 0.88  CALCIUM  8.9 8.5*    Intake/Output Summary (Last 24 hours) at 02/13/2024 9187 Last data filed at 02/13/2024 0800 Gross per 24 hour  Intake 600 ml  Output 2450 ml  Net -1850 ml        Physical Exam: Vital Signs Blood pressure (!) 114/55, pulse 60, temperature 98 F (36.7 C), resp. rate 17, height 6' (1.829 m), weight 76.9 kg, SpO2 97%.  Constitutional: No apparent distress. Appropriate appearance for age.  Laying in bed HENT: Atraumatic, normocephalic. Cardiovascular: RRR, no murmurs/rub/gallops. No Edema.   Respiratory: Clear to auscultation bilaterally.  No rales, rhonchi, or wheezing.. On RA.  Abdomen: + bowel sounds, normoactive. No distention or tenderness.  GU: Not examined. +Foley, draining clear urine.  Skin: C/D/I. No apparent lesions or rashes noted.   MSK:      No apparent deformity.       Neurologic exam:  Cognition: Responds appropriately to verbal cues, nods head yes and no, follows simple commands.  Moving all 4 limbs in bed.  See prior exams: AAO to person, place, time. + Mild to moderate cognitive deficits--waxes and wanes, consistently more lethargic/confused in the morning  Language: Fluent, No substitutions or neoglisms.  Mild dysarthria. Insight: Fair insight into current condition.  Mood: Pleasant affect, appropriate mood.  Sensation: To  light touch intact in BL UEs and LEs  Reflexes: 2+ in BL UE and LEs. Negative Hoffman's and babinski signs bilaterally.  CN: 2-12 grossly intact.  Coordination: Mild left upper and lower extremity ataxia--stable Spasticity: MAS 0 in all extremities.        Strength: Some delayed responses this a.m., secondary lethargy                RUE: 5/5 SA, 5/5 EF, 5/5 EE, 5/5 WE, 5/5 FF, 5/5 FA                LUE:  5-/5 SA, 5-/5 EF, 5-/5 EE, 5-/5 WE, 5-/5 FF, 5-/5 FA                RLE: 4/5 HF, 4/5 KE,4 /5  DF, 4/5  EHL, 4/5  PF                 LLE:  4-/5 HF, 4-/5 KE, 4/5  DF, 4/5  EHL, 4/5  PF     Assessment/Plan: 1. Functional deficits which require 3+ hours per day of interdisciplinary therapy in a comprehensive inpatient rehab setting. Physiatrist is providing close team supervision and 24 hour management of  active medical problems listed below. Physiatrist and rehab team continue to assess barriers to discharge/monitor patient progress toward functional and medical goals  Care Tool:  Bathing    Body parts bathed by patient: Right arm, Chest, Left arm, Abdomen, Face, Right upper leg, Left upper leg, Right lower leg, Left lower leg   Body parts bathed by helper: Front perineal area, Buttocks     Bathing assist Assist Level: Minimal Assistance - Patient > 75%     Upper Body Dressing/Undressing Upper body dressing   What is the patient wearing?: Pull over shirt    Upper body assist Assist Level: Moderate Assistance - Patient 50 - 74%    Lower Body Dressing/Undressing Lower body dressing      What is the patient wearing?: Pants, Incontinence brief     Lower body assist Assist for lower body dressing: Maximal Assistance - Patient 25 - 49%     Toileting Toileting    Toileting assist Assist for toileting: 2 Helpers     Transfers Chair/bed transfer  Transfers assist     Chair/bed transfer assist level: Moderate Assistance - Patient 50 - 74%      Locomotion Ambulation   Ambulation assist      Assist level: 2 helpers Assistive device: Walker-rolling Max distance: 112'   Walk 10 feet activity   Assist     Assist level: 2 helpers Assistive device: Walker-rolling   Walk 50 feet activity   Assist    Assist level: 2 helpers Assistive device: Walker-rolling    Walk 150 feet activity   Assist Walk 150 feet activity did not occur: Safety/medical concerns (Max distance 112')         Walk 10 feet on uneven surface  activity   Assist Walk 10 feet on uneven surfaces activity did not occur: Safety/medical concerns         Wheelchair     Assist Is the patient using a wheelchair?: Yes Type of Wheelchair: Manual    Wheelchair assist level: Dependent - Patient 0%      Wheelchair 50 feet with 2 turns activity    Assist        Assist Level: Dependent - Patient 0%   Wheelchair 150 feet activity     Assist      Assist Level: Dependent - Patient 0%   Blood pressure (!) 114/55, pulse 60, temperature 98 F (36.7 C), resp. rate 17, height 6' (1.829 m), weight 76.9 kg, SpO2 97%.  1. Functional deficits secondary to acute pontine infarct             -patient may  shower             -ELOS/Goals: 9-12 days Min A to supervision - 11/1 DC              - stable to continue CIR  - 10/27: Will recheck U/A/culture, does not appear as oriented this AM as prior notes, Discussed with therapy- not felt to be off his usual baseline as he waxes and waves, nursing reports oriented x3 later in day, continue to monitor    - 10/28: Per therapies at cognitive baseline. Min A bed mobility - up to 300 ft with RW with Min A. Self-limiting and some cognitive issues. Setup UBD, L lateral lean with sitting and standing. Poor motivation /apathy huge. Some L inattention. Moderate cognitive deficits,  effected by self-limiting and poor participation. Very defiant behaviorally.   - Behavior plan placed by team  -  Therapy teams planning therapy schedule in the late morning and early afternoon due to consistent a.m. lethargy and confusion; wife states this is consistent with how he is at home, generally does not wake up before lunch.   - Teams tomorrow a.m.  2.  Antithrombotics: -DVT/anticoagulation:  Pharmaceutical: Lovenox              -antiplatelet therapy: Aspirin  and  Plavix  until 10/28 then aspirin  alone--finished Plavix    3. Pain Management: Oxycodone  and Tylenol  as needed--well-controlled   4. Mood/Behavior/Sleep: LCSW to follow for evaluation and support when available.              -antipsychotic agents: Lexapro  10 mg daily - Delirium : Seroquel  25 mg nightly--tolerating well  --waxes/wanes - 10/28: Move seroquel  to PRN, add mirtazepine 15 mg at bedtime for sleep and mood 10/29: Poor sleep since removal of seroquel ; resume at 25 mg at bedtime 10/30: Overly sedated this a.m.  Reduce mirtazapine to 7.5 mg nightly.  Therapies adjusting for afternoon schedules 10-31: Again, increasingly lethargic into the later afternoon.  DC Seroquel , continue mirtazapine 7.5 mg nightly, change as needed to Benadryl  25 mg given patient reports of itching sensation as he falls asleep without objective rashes. 11/1 reports slept better yesterday, doesn't appear that he got as needed Benadryl  11-3: Resting well on exam.   5. Neuropsych/cognition: This patient may be intermittently capable of making decisions on their own behalf.   6. Skin/Wound Care: Routine pressure relief measures             -Stage 2 Sacral wound--wound care orders placed--Gerhardt's butt cream +Zinc  PO   7. Fluids/Electrolytes/Nutrition: Monitor intake and output.  CBC/CMET in a.m.             - SLP consult             -GERD: Continue PPI   -10-25: Albumin 2.4.  Encourage p.o. intakes.  - BMP ordered 10-29 due to orthostasis, possible hypovolemia--a.m. labs stable.  10/30: Cr increase to 1.1; give 1L IVF today; BMP in AM   10-31: BMP  pending, encouraging p.o. fluids  11/2 rechecked BMP yesterday- Cr slightly down to 1.08, encourage oral fluids  11-3: Creatinine 0.8.  Resolved, monitor  8.  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  Atorvastatin  80 mg daily.   9.  Acute metabolic encephalopathy: Secondary to aspiration pneumonia and hypotension.  Mentation improved continue to monitor.   10. Hypomagnesia: Mg 1.4<2.0<1.5< now 1.6 monitor and replace.    11.  Bladder outlet obstruction: Chronic foley catheter last changed 10/18             -continue CHG and foley catheter care.  - f/u with urology outpatient.    12.  HTN: resumed home Losartan  25 mg daily, monitor BP with increased activity for hypotension    - 10-26: BP slightly elevated this a.m., back down this afternoon.  Monitor    -10/27 BP controlled, overall, continue current regimen   10-28: Orthostatic hypotension with therapies, severe.  Hold losartan  for tomorrow AM.  Encourage p.o. fluids today.  - 10/29: Hypertensive overnight but orthostatic with therapies consistnetly 10/30:  give 1L IVF today, teds, binder   10-31: BP slightly improved, remains low out of bed with teds, patient asymptomatic.  Encouraging p.o. fluids, may need additional bolus   BP controlled overall, continue current regimen      02/13/2024    5:10 AM 02/12/2024    8:14 PM  02/12/2024    1:41 PM  Vitals with BMI  Systolic 114 120 870  Diastolic 55 59 64  Pulse 60 63 61     13.  T2DM: A1c 8.5% on 10/7  -monitor cbgs ac/hs with Novolog  SSI and Lantus  15 units  -10-26: Blood sugars consistently elevated, increase Semglee  to 20 units daily -10/27 increase semglee  to 23 units - 10/28 appears semglee  was Dced?? Add back at 23 U -10-29: Low in a.m., apparent under coverage for meals.  Resume home metformin  500 mg twice daily.  May need more Premeal added. 10/30: BG better controlled with current regimen, monitor  fair control, continue  current and monitor Recent Labs    02/12/24 1644 02/12/24 2105 02/13/24 0551  GLUCAP 141* 171* 144*     14.CAD/HLD: Atorvastatin    15.  Severe sepsis due to aspiration RLL PNA: Initially treated with IV meropenem now transition to p.o. Augmentin  ends 10/24.  ID consulted, Dr. Fleeta Rothman recommended exchanging Foley due to fungemia.             - Aspiration precautions             - Palliative care consult: Patient remains DNR limited   16. Cholelithiasis: Maybe chronic, no current GI symptoms. Continue to monitor.    17.  Heart block: S/p PPM    - Heart rate well-controlled, monitor    02/13/2024    5:10 AM 02/12/2024    8:14 PM 02/12/2024    1:41 PM  Vitals with BMI  Systolic 114 120 870  Diastolic 55 59 64  Pulse 60 63 61    18.  Constipation/neurogenic bowel.  Last bowel movement 10-20.  Wife endorses after his last stroke, he was incontinent, improved after leaving hospital.  - Start Senokot S1 tab twice daily   - Miralax  PRN--10-26 add to daily  - 10-26: Add as needed sorbitol ; would give if no bowel movement tonight  -10/27 LBM today, continue to monitor -- incontinent per therapies  Large bowel movement 10-31, incontinent.  Continue timed toileting when getting up out of bed. -11/2 LBM yesteray- continue to monitor   19. Itching -Reports after he was given chlorhexadine or similar type product?, discussed nursing, will not use this today.  Chlorhexidine  was discontinued -Benedryl prn, sarna prn--used twice on 11-2.  No obvious rash on exam.   LOS: 10 days A FACE TO FACE EVALUATION WAS PERFORMED  Joesph JAYSON Likes 02/13/2024, 8:12 AM

## 2024-02-14 DIAGNOSIS — I639 Cerebral infarction, unspecified: Secondary | ICD-10-CM | POA: Diagnosis not present

## 2024-02-14 LAB — GLUCOSE, CAPILLARY
Glucose-Capillary: 172 mg/dL — ABNORMAL HIGH (ref 70–99)
Glucose-Capillary: 150 mg/dL — ABNORMAL HIGH (ref 70–99)
Glucose-Capillary: 259 mg/dL — ABNORMAL HIGH (ref 70–99)
Glucose-Capillary: 154 mg/dL — ABNORMAL HIGH (ref 70–99)
Glucose-Capillary: 74 mg/dL (ref 70–99)

## 2024-02-14 MED ORDER — INSULIN GLARGINE-YFGN 100 UNIT/ML ~~LOC~~ SOLN
25.0000 [IU] | Freq: Every day | SUBCUTANEOUS | Status: DC
  Administered 2024-02-14 – 2024-02-16 (×3): 25 [IU] via SUBCUTANEOUS
  Filled 2024-02-14 (×3): qty 0.25

## 2024-02-14 MED ORDER — INSULIN GLARGINE-YFGN 100 UNIT/ML ~~LOC~~ SOLN: 25.0000 [IU] | Freq: Every day | SUBCUTANEOUS | Status: DC

## 2024-02-14 NOTE — Progress Notes (Signed)
 Occupational Therapy Session Note  Patient Details  Name: John [name] MRN: [id number] Date of Birth: 29-Oct-1945   Short Term Goals: Week 2:  OT Short Term Goal 1 (Week 2): Pt will complete full-body bathing with Min A. OT Short Term Goal 2 (Week 2): Pt will complete 3/3 toileting tasks with Min A. OT Short Term Goal 3 (Week 2): Pt will complete ambulatory transfers with CGA + LRAD.  Skilled Therapeutic Interventions/Progress Updates:  Occupational Therapist participated in the interdisciplinary team conference, providing clinical information regarding the patient's current status, treatment goals, and weekly focus, including any barriers that need to be addressed. Please see the Inpatient Rehabilitation Team Conference and Plan of Care Update for further details.  Therapy Documentation Precautions:  Precautions Precautions: Fall Precaution Booklet Issued: No Recall of Precautions/Restrictions: Impaired Precaution/Restrictions Comments: L hemiparesis Restrictions Weight Bearing Restrictions Per Provider Order: No   Therapy/Group: Individual Therapy  [name], OTR/L, MSOT  02/14/2024, 4:05 PM

## 2024-02-14 NOTE — Plan of Care (Signed)
 Goals downgraded due to patient's CLOF and anticipated LOS. FP 11/4. Problem: RH Awareness Goal: LTG: Patient will demonstrate awareness during functional activites type of (OT) Description: LTG: Patient will demonstrate awareness during functional activites type of (OT) Outcome: Not Applicable   Problem: RH Bathing Goal: LTG Patient will bathe all body parts with assist levels (OT) Description: LTG: Patient will bathe all body parts with assist levels (OT) Flowsheets (Taken 02/14/2024 0748) LTG: Pt will perform bathing with assistance level/cueing: Moderate Assistance - Patient 50 - 74%   Problem: RH Dressing Goal: LTG Patient will perform lower body dressing w/assist (OT) Description: LTG: Patient will perform lower body dressing with assist, with/without cues in positioning using equipment (OT) Flowsheets (Taken 02/14/2024 0748) LTG: Pt will perform lower body dressing with assistance level of: Moderate Assistance - Patient 50 - 74%

## 2024-02-14 NOTE — Progress Notes (Signed)
 Occupational Therapy Session Note  Patient Details  Name: John [name] MRN: [id number] Date of Birth: Apr 25, 1945  Today's Date: 02/14/2024 OT Individual Time: 1100-1155 OT Individual Time Calculation (min): 55 min    Short Term Goals: Week 2:  OT Short Term Goal 1 (Week 2): Pt will complete full-body bathing with Min A. OT Short Term Goal 2 (Week 2): Pt will complete 3/3 toileting tasks with Min A. OT Short Term Goal 3 (Week 2): Pt will complete ambulatory transfers with CGA + LRAD.  Skilled Therapeutic Interventions/Progress Updates: Patient received sitting up  in tilt in space w/c. Patient agreeable to participate with OT. No c/o's. Patient assisted to therapy gym for work on LUE NMRE and FM coordination. Assisted patient with LUE ROM/stretch, no pain through full ROM. Continued treatment with 2 lb dowel therEx working on proximal strength. Assisted patient at elbow on the left throughout sets of 10 reps. Patient with limited tolerance of lateral reaching due to chronic LBP. Performed each set 2 times. Continued treatment with in hand manipulation and strengthening using clothes pins for 2 and 3 point pinch. Patient with visible atrophy in B forearms. Patient given soft theraputty for HEP. Patient with good attention and return demo of each exercise. Encouraged to perform with B hands, but to focus on the left hand. Assisted patient back to room to perform grooming and hygiene tasks seated at the sink before lunch. Good participation throughout treatment session. Continue with skilled OT POC.      Therapy Documentation Precautions:  Precautions Precautions: Fall Precaution Booklet Issued: No Recall of Precautions/Restrictions: Impaired Precaution/Restrictions Comments: L hemiparesis Restrictions Weight Bearing Restrictions Per Provider Order: No    Pain:Reports fluctuating back pain with functional reaching tasks   ADL: ADL Eating: Set up Where Assessed-Eating:  Chair Grooming: Setup Where Assessed-Grooming: Sitting at sink Upper Body Bathing: Moderate assistance Where Assessed-Upper Body Bathing: Wheelchair Lower Body Bathing: Maximal assistance Where Assessed-Lower Body Bathing: Sitting at sink Upper Body Dressing: Moderate assistance Where Assessed-Upper Body Dressing: Sitting at sink Lower Body Dressing: Maximal assistance Where Assessed-Lower Body Dressing: Sitting at sink, Standing at sink Toileting: Dependent Where Assessed-Toileting: Bedside Commode Toilet Transfer: Maximal assistance (+2) Toilet Transfer Method: Stand pivot Toilet Transfer Equipment: Bedside commode Tub/Shower Transfer: Unable to assess    Therapy/Group: Individual Therapy  [name] 02/14/2024, 12:34 PM

## 2024-02-14 NOTE — Progress Notes (Signed)
 Physical Therapy Session Note  Patient Details  Name: John [name] MRN: [id number] Date of Birth: 08/23/1945  Today's Date: 02/14/2024 PT Individual Time: 1450-1532 PT Individual Time Calculation (min): 42 min   Short Term Goals: Week 2:  PT Short Term Goal 1 (Week 2): STG = LTG due to ELOS  Skilled Therapeutic Interventions/Progress Updates:    Pt presents in room in [facility], agreeable to PT. Pt reporting increased pain in back today, reports sharp pains with mobility with pt demonstrating wincing with sit to stand transfers. Therapist encourages fluids throughout session. Session focused on gait training for upright tolerance as well as therapeutic activities for pain control and decreasing stiffness. Pt completes sit to stand with min/mod assist during session due to back pain, requires increased time to complete. Pt ambulates 95' with CGA/light min assist, significant improvement in LLE foot clearance and midline orientation compared to yesterday's session. Pt takes extended seated rest break during which pt remains seated with moist heat to low back to decrease pain and improve tolerance to mobility. Pt completes second gait trial with RW, denies pain however does demonstrate increased difficulty with sit to stand with pt demonstrating poor terminal knee extension requiring increased assist to stand. Pt requires seated rest break, then ambulates back to his room ~95' with RW min assist. Pt returns to room and remains seated in [facility] with all needs within reach, cal light in place and chair alarm donned and activated at end of session. Pt declines pain medicine at this time, therapist places disposable heat pack to low back to alleviate pain at end of session, RN notified.     Therapy Documentation Precautions:  Precautions Precautions: Fall Precaution Booklet Issued: No Recall of Precautions/Restrictions: Impaired Precaution/Restrictions Comments: L hemiparesis Restrictions Weight Bearing  Restrictions Per Provider Order: No   Therapy/Group: Individual Therapy  Reche Ohara PT, DPT 02/14/2024, 4:34 PM

## 2024-02-14 NOTE — Progress Notes (Signed)
 Physical Therapy Note  Patient Details  Name: John [name] MRN: [id number] Date of Birth: February 18, 1946 Today's Date: 02/14/2024    Physical Therapist participated in the interdisciplinary team conference, providing clinical information regarding the patient's current status, treatment goals, and weekly focus, including any barriers that need to be addressed. Please see the Inpatient Rehabilitation Team Conference and Plan of Care Update for further details.    Reche Ohara PT, DPT 02/14/2024, 4:52 PM

## 2024-02-14 NOTE — Progress Notes (Signed)
 Occupational Therapy Session Note  Patient Details  Name: John [name] MRN: [id number] Date of Birth: 1946/02/17  Today's Date: 02/14/2024 OT Individual Time: 1350-1430 OT Individual Time Calculation (min): 40 min    Short Term Goals: Week 2:  OT Short Term Goal 1 (Week 2): Pt will complete full-body bathing with Min A. OT Short Term Goal 2 (Week 2): Pt will complete 3/3 toileting tasks with Min A. OT Short Term Goal 3 (Week 2): Pt will complete ambulatory transfers with CGA + LRAD.  Skilled Therapeutic Interventions/Progress Updates:   Pt greeted sitting in [facility] for skilled OT session with focus on BADL retraining and functional transfers. Pt with intermediate reports of pain in low back, rest provided as needed. Pt performs walk-in shower transfer with CGA-Min A + RW, Min A provided for occasional L lateral lean.  Pt completes full-body bathing with OT providing CGA during standing pericare with single UE support on grab bar. Pt dresses LB with Mod A for completion of threading/hike, as well as, foley management. Max A provided for TEDs/socks. Setup A provided for UB dressing. Pt remained sitting in TIS WC with all immediate needs met.   Therapy Documentation Precautions:  Precautions Precautions: Fall Precaution Booklet Issued: No Recall of Precautions/Restrictions: Impaired Precaution/Restrictions Comments: L hemiparesis Restrictions Weight Bearing Restrictions Per Provider Order: No   Therapy/Group: Individual Therapy  [name], OTR/L, MSOT  02/14/2024, 7:47 AM

## 2024-02-14 NOTE — Progress Notes (Signed)
 Patient ID: John [name], male   DOB: 1945/06/12, 78 y.o.   MRN: [id number]  [id number]- SW left a message for pt wife [name] requesting return phone cal at her earliest convenience to discuss team conference updates. SW waiting on follow-up.   [name], MSW, LCSW Office: [phone] Cell: [phone] Fax: [phone]

## 2024-02-14 NOTE — Patient Care Conference (Signed)
 Inpatient RehabilitationTeam Conference and Plan of Care Update Date: 02/14/2024   Time: 1046 am    Patient Name: John [name]      Medical Record Number: [id number]  Date of Birth: 1945-08-03 Sex: Male         Room/Bed: 4M12C/4M12C-01 Payor Info: Payor: [employer]UE [employer] MEDICARE / Plan: BCBS MEDICARE / Product Type: *No Product type* /    Admit Date/Time:  02/03/2024  4:25 PM  Primary Diagnosis:  CVA (cerebral vascular accident) [facility])  Hospital Problems: Principal Problem:   CVA (cerebral vascular accident) [facility])    Expected Discharge Date: Expected Discharge Date: 02/18/24  Team Members Present: Physician leading conference: Dr. Joesph Likes Social Worker Present: [name], LCSW Nurse Present: Eulalio Falls, RN PT Present: Catilin Osborn, PT OT Present: [name], OT SLP Present: Recardo Mole, SLP Other (Discipline and Name): Odis Guppy PT PPS Coordinator present : Eleanor Colon, SLP     Current Status/Progress Goal Weekly Team Focus  Bowel/Bladder   Indwelling Foley   Peri Care/ Foley care q shift   Foley care q shift to prevent CAUTI    Swallow/Nutrition/ Hydration               ADL's   Continues to fluctuate in levels of assistance required based on time of day and willingness to participate. Can be setup for UB care in supported sitting and Mod-Max A for LB care. Barriers: Apathy, decreased midline orientation, L-sided inattention.   Min A for UB care, downgraded LB care to Mod A   Caregiver education and discharge planning    Mobility   can require as much as max assist for bed mobility, transfers min/modA, gait up to 35' with RW with minA/modA   downgraded to CGA  barriers: self limiting, cognition; focus on gait training, transfer training, balance, endurance    Communication                Safety/Cognition/ Behavioral Observations  appears to present w/ overall moderate cognitive deficits overall - STM, attention,  problem solving, awareness - pt participation negatively impacts success   min to modA   pt participation, pt/family education, cognitive retraining    Pain   No Pain   Continue to assess for pain   Continue to assess for pain and manage it accordingly    Skin   Stage 2 Ulcer   Prevent ulcer from worsening  Q2h turns, Moisture Barrier , Foam dressing      Discharge Planning:  Pt wil d/ to home with his wife who is primary caregiver. He needs to be as independent as possible. SW will confirm there are no barriers to discharge.    Team Discussion: Patient was admitted post pontine stroke. Patient with rash/ itching/ fungemia/ hyperglycemia/ : medication, treatments  adjusted by MD. Patient progress limited by self limiting behaviors, moderate cognitive deficits, poor motivation, left inattention, decreased midline orientation, poor participation with care, lethargy and confusion.  Patient on target to meet rehab goals: no, Patient needs min assistance with upper body care and mod assist with lower body care.Therapy  fluctiuates depending on the time of day. Patient needs max assistance to supervision for mobility depending on mood and motivation. Patient was able to ambulate up to 65' min-mod assistance using a rolling walker.  Patient with  moderate cognitive deficits.Overall goals are set for min-mod assistance.  *See Care Plan and progress notes for long and short-term goals.   Revisions to Treatment Plan: Therapy  teams planning therapy schedule in late morning and early afternoons Foley exchange Family training Downgraded goals  Teaching Needs: Safety,medications, transfers, toileting, etc.   Current Barriers to Discharge: Decreased caregiver support, Home enviroment access/layout, Neurogenic bowel and bladder, and Behavior  Possible Resolutions to Barriers: Family Education SNF      Medical Summary Current Status: Medically complicated by cognitive deficits , chronic  Foley catheter, hypertension, type 2 diabetes with hyperglycemia, constipation, and itching  Barriers to Discharge: Behavior/Mood;Medical stability;Self-care education;Uncontrolled Diabetes   Possible Resolutions to [employer]: Benadryl  as needed for pruritus and titration of medications, avoidance of Chlorhex wipes; adjusting therapy schedules and medications for a.m. lethargy/confusion; monitor for recurrent UTI   Continued Need for Acute Rehabilitation Level of Care: The patient requires daily medical management by a physician with specialized training in physical medicine and rehabilitation for the following reasons: Direction of a multidisciplinary physical rehabilitation program to maximize functional independence : Yes Medical management of patient stability for increased activity during participation in an intensive rehabilitation regime.: Yes Analysis of laboratory values and/or radiology reports with any subsequent need for medication adjustment and/or medical intervention. : Yes   I attest that I was present, lead the team conference, and concur with the assessment and plan of the team.   Mckinlee Dunk Gayo 02/14/2024, 1046 am

## 2024-02-14 NOTE — Progress Notes (Addendum)
 PROGRESS NOTE   Subjective/Complaints: No evens overnight.  As needed Benadryl  use twice yesterday.  Denies any current itching, discomfort.  No complaints today. Vitals stable. BG elevated recently, 150-200s.    Patient states he is sleeping better overall.  ROS: Denies fevers, chills, N/V, abdominal pain, nausea, constipation, diarrhea, SOB, cough, chest pain, new weakness or paraesthesias.   Insomnia- improved Itching: Intermittent  Objective:   No results found. Recent Labs    02/13/24 0442  WBC 8.7  HGB 10.9*  HCT 33.3*  PLT 280    Recent Labs    02/11/24 1359 02/13/24 0442  NA 142 139  K 4.2 3.6  CL 106 102  CO2 25 24  GLUCOSE 151* 154*  BUN 18 14  CREATININE 1.08 0.88  CALCIUM  8.9 8.5*    Intake/Output Summary (Last 24 hours) at 02/14/2024 0913 Last data filed at 02/14/2024 0700 Gross per 24 hour  Intake 720 ml  Output 1100 ml  Net -380 ml        Physical Exam: Vital Signs Blood pressure (!) 139/57, pulse 60, temperature 97.9 F (36.6 C), temperature source Oral, resp. rate 16, height 6' (1.829 m), weight 76.9 kg, SpO2 98%.  Constitutional: No apparent distress.  Sitting up in bedside wheelchair. HENT: Atraumatic, normocephalic. Cardiovascular: RRR, no murmurs/rub/gallops. No Edema.   Respiratory: Clear to auscultation bilaterally.  No rales, rhonchi, or wheezing. On RA.  Abdomen: + bowel sounds, normoactive. No distention or tenderness.  GU: Not examined. +Foley, draining clear urine.  Skin: C/D/I. No apparent lesions or rashes noted.    MSK:      No apparent deformity.       Neurologic exam:  AAO to person, place; time with cues. + Mild to moderate cognitive deficits--waxes and wanes, consistently more lethargic/confused in the morning   -Able to perform basic math, to complex reasoning, abstraction; remembers 1/3 items at 5 minutes  Insight: Fair insight into current condition.   Mood: Pleasant affect, appropriate mood.  Sensation: To light touch intact in BL UEs and LEs  Reflexes: Negative Hoffman's and babinski signs bilaterally.  CN: 2-12 grossly intact.  Coordination: Mild left upper and lower extremity ataxia unchanged Spasticity: MAS 0 in all extremities.   Strength; antigravity against resistance bilateral upper and lower extremities, 4-5- /5 in left upper and lower extremities, 5/5 in right upper and lower extremities  Assessment/Plan: 1. Functional deficits which require 3+ hours per day of interdisciplinary therapy in a comprehensive inpatient rehab setting. Physiatrist is providing close team supervision and 24 hour management of active medical problems listed below. Physiatrist and rehab team continue to assess barriers to discharge/monitor patient progress toward functional and medical goals  Care Tool:  Bathing    Body parts bathed by patient: Right arm, Chest, Left arm, Abdomen, Face, Right upper leg, Left upper leg, Right lower leg, Left lower leg   Body parts bathed by helper: Front perineal area, Buttocks     Bathing assist Assist Level: Minimal Assistance - Patient > 75%     Upper Body Dressing/Undressing Upper body dressing   What is the patient wearing?: Pull over shirt    Upper body assist Assist Level:  Moderate Assistance - Patient 50 - 74%    Lower Body Dressing/Undressing Lower body dressing      What is the patient wearing?: Pants, Incontinence brief     Lower body assist Assist for lower body dressing: Maximal Assistance - Patient 25 - 49%     Toileting Toileting    Toileting assist Assist for toileting: 2 Helpers     Transfers Chair/bed transfer  Transfers assist     Chair/bed transfer assist level: Moderate Assistance - Patient 50 - 74%     Locomotion Ambulation   Ambulation assist      Assist level: 2 helpers Assistive device: Walker-rolling Max distance: 112'   Walk 10 feet  activity   Assist     Assist level: 2 helpers Assistive device: Walker-rolling   Walk 50 feet activity   Assist    Assist level: 2 helpers Assistive device: Walker-rolling    Walk 150 feet activity   Assist Walk 150 feet activity did not occur: Safety/medical concerns (Max distance 112')         Walk 10 feet on uneven surface  activity   Assist Walk 10 feet on uneven surfaces activity did not occur: Safety/medical concerns         Wheelchair     Assist Is the patient using a wheelchair?: Yes Type of Wheelchair: Manual    Wheelchair assist level: Dependent - Patient 0%      Wheelchair 50 feet with 2 turns activity    Assist        Assist Level: Dependent - Patient 0%   Wheelchair 150 feet activity     Assist      Assist Level: Dependent - Patient 0%   Blood pressure (!) 139/57, pulse 60, temperature 97.9 F (36.6 C), temperature source Oral, resp. rate 16, height 6' (1.829 m), weight 76.9 kg, SpO2 98%.  1. Functional deficits secondary to acute pontine infarct             -patient may  shower             -ELOS/Goals: 9-12 days Min A to supervision -  11/8               - stable to continue CIR  - 10/27: Will recheck U/A/culture, does not appear as oriented this AM as prior notes, Discussed with therapy- not felt to be off his usual baseline as he waxes and waves, nursing reports oriented x3 later in day, continue to monitor    - 10/28: Per therapies at cognitive baseline. Min A bed mobility - up to 300 ft with RW with Min A. Self-limiting and some cognitive issues. Setup UBD, L lateral lean with sitting and standing. Poor motivation /apathy huge. Some L inattention. Moderate cognitive deficits,  effected by self-limiting and poor participation. Very defiant behaviorally.   - Behavior plan placed by team  - Therapy teams planning therapy schedule in the late morning and early afternoon due to consistent a.m. lethargy and confusion;  wife states this is consistent with how he is at home, generally does not wake up before lunch.   - 11/4: Fluctuant, Max A to supervision for bed mobility depending on mood and motivation. Walking less up to 60 ft, increasing L lateral lean noted. Behaviorally less engaged the longer he is here. Flustuates in OT depending on time of day.   SNF suggested, although appears patient does not have many days left.  Min A UB/Mod A LB goals. Has  no buy-in with SLP. Family training tomorrow.   2.  Antithrombotics: -DVT/anticoagulation:  Pharmaceutical: Lovenox              -antiplatelet therapy: Aspirin  and  Plavix  until 10/28 then aspirin  alone--finished Plavix    3. Pain Management: Oxycodone  and Tylenol  as needed--well-controlled   4. Mood/Behavior/Sleep: LCSW to follow for evaluation and support when available.              -antipsychotic agents: Lexapro  10 mg daily - Delirium : Seroquel  25 mg nightly--tolerating well  --waxes/wanes - 10/28: Move seroquel  to PRN, add mirtazepine 15 mg at bedtime for sleep and mood 10/29: Poor sleep since removal of seroquel ; resume at 25 mg at bedtime 10/30: Overly sedated this a.m.  Reduce mirtazapine to 7.5 mg nightly.  Therapies adjusting for afternoon schedules 10-31: Again, increasingly lethargic into the later afternoon.  DC Seroquel , continue mirtazapine 7.5 mg nightly, change as needed to Benadryl  25 mg given patient reports of itching sensation as he falls asleep without objective rashes. 11/1 reports slept better yesterday, doesn't appear that he got as needed Benadryl  11-4: Patient reports better sleep, monitor--doing well with current medications.  Behaviorally, engaging less with therapies, feels he is mostly self-limited and has little therapy buy-in at this point.  Mood, interaction is appropriate on my exam.   5. Neuropsych/cognition: This patient may be intermittently capable of making decisions on their own behalf.    - 11-4: Consistently better in  the afternoons in the late morning; consistent with reports from his wife.  6. Skin/Wound Care: Routine pressure relief measures             -Stage 2 Sacral wound--wound care orders placed--Gerhardt's butt cream +Zinc  PO   7. Fluids/Electrolytes/Nutrition: Monitor intake and output.  CBC/CMET in a.m.             - SLP consult             -GERD: Continue PPI   -10-25: Albumin 2.4.  Encourage p.o. intakes.  - BMP ordered 10-29 due to orthostasis, possible hypovolemia--a.m. labs stable.  10/30: Cr increase to 1.1; give 1L IVF today; BMP in AM   10-31: BMP pending, encouraging p.o. fluids  11/2 rechecked BMP yesterday- Cr slightly down to 1.08, encourage oral fluids  11-3: Creatinine 0.8.  Resolved, monitor  8.  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  Atorvastatin  80 mg daily.   9.  Acute metabolic encephalopathy: Secondary to aspiration pneumonia and hypotension.  Mentation improved continue to monitor.   10. Hypomagnesia: Mg 1.4<2.0<1.5< now 1.6 monitor and replace.    11.  Bladder outlet obstruction: Chronic foley catheter last changed 10/18; 10/31             -continue CHG and foley catheter care.  - f/u with urology outpatient.    12.  HTN: resumed home Losartan  25 mg daily, monitor BP with increased activity for hypotension    - 10-26: BP slightly elevated this a.m., back down this afternoon.  Monitor    -10/27 BP controlled, overall, continue current regimen   10-28: Orthostatic hypotension with therapies, severe.  Hold losartan  for tomorrow AM.  Encourage p.o. fluids today.  - 10/29: Hypertensive overnight but orthostatic with therapies consistnetly 10/30:  give 1L IVF today, teds, binder   10-31: BP slightly improved, remains low out of bed with teds, patient asymptomatic.  Encouraging p.o. fluids, may need additional bolus   BP controlled overall, continue current regimen  02/14/2024    5:24 AM 02/13/2024    8:26 PM 02/13/2024     2:38 PM  Vitals with BMI  Systolic 139 132 889  Diastolic 57 66 44  Pulse 60 92 74     13.  T2DM: A1c 8.5% on 10/7  -monitor cbgs ac/hs with Novolog  SSI and Lantus  15 units  -10-26: Blood sugars consistently elevated, increase Semglee  to 20 units daily -10/27 increase semglee  to 23 units - 10/28 appears semglee  was Dced?? Add back at 23 U -10-29: Low in a.m., apparent under coverage for meals.  Resume home metformin  500 mg twice daily.  May need more Premeal added. 10/30: BG better controlled with current regimen, monitor 11/4: BG elevated recently; increase Semglee  to 25 units Recent Labs    02/13/24 1739 02/13/24 2147 02/14/24 0521  GLUCAP 218* 176* 172*     14.CAD/HLD: Atorvastatin    15.  Severe sepsis due to aspiration RLL PNA: Initially treated with IV meropenem now transition to p.o. Augmentin  ends 10/24.  ID consulted, Dr. Fleeta Rothman recommended exchanging Foley due to fungemia.             - Aspiration precautions             - Palliative care consult: Patient remains DNR limited  - 10-31: Foley again exchanged, culture positive for less than 70,000 colonies of yeast, Foley exchange should have been sufficient.  Monitor.   16. Cholelithiasis: Maybe chronic, no current GI symptoms. Continue to monitor.    17.  Heart block: S/p PPM    - Heart rate well-controlled, monitor    02/14/2024    5:24 AM 02/13/2024    8:26 PM 02/13/2024    2:38 PM  Vitals with BMI  Systolic 139 132 889  Diastolic 57 66 44  Pulse 60 92 74    18.  Constipation/neurogenic bowel.  Last bowel movement 10-20.  Wife endorses after his last stroke, he was incontinent, improved after leaving hospital.  - Start Senokot S1 tab twice daily   - Miralax  PRN--10-26 add to daily 11-3: Bowel movement, continent.  Seems to be slowing down.  19. Itching -Reports after he was given chlorhexadine or similar type product?, discussed nursing, will not use this today.  Chlorhexidine  was discontinued -Benedryl  prn, sarna prn--used twice on 11-2.  No obvious rash on exam.   LOS: 11 days A FACE TO FACE EVALUATION WAS PERFORMED  Joesph JAYSON Likes 02/14/2024, 9:13 AM

## 2024-02-14 NOTE — Progress Notes (Signed)
 Physical Therapy Session Note  Patient Details  Name: John [name] MRN: [id number] Date of Birth: 02-15-1946  Today's Date: 02/14/2024 PT Individual Time: 9152-9054 PT Individual Time Calculation (min): 58 min   Short Term Goals: Week 2:  PT Short Term Goal 1 (Week 2): STG = LTG due to ELOS  Skilled Therapeutic Interventions/Progress Updates: Pt presents supine in bed and asleep.  Pt does arouse and agreeable to therapy.  P{t initiates sup to sit using side rails and able to bring LES off EOB, but unable to complete w/o mod A.  Pt lists to right, but does correct w/ cues.  Pt requires min A w/ pushing into [employer] slip-ons.  Pt transfers sit to stand w/ mod A and cues for hand placement.  Pt steps to TIS w/ Min A and RW to eat breakfast.  Ed given on D/C date set and pt recognizes that he is not mobilizing as well as I want.  Pt wheeled to main gym for time conservation.  Pt transfers sit to stand w/ mod A and cues for hand placement.  Pt performed toe taps to 6 platform w/ RW and  then transitioned to w/ R HHA.  Pt exhibits knee buckling and retropulse w/o cues.  Pt negotiates 4 steps w/ self-selecting reciprocal, but cues for step-to sequencing 2/2 L knee weakness.  Pt performs step-ups w/ alternating LES and 2 rails.  Pt returned to room and remained sitting in TIS w/ seat alarm on and all needs in reach.     Therapy Documentation Precautions:  Precautions Precautions: Fall Precaution Booklet Issued: No Recall of Precautions/Restrictions: Impaired Precaution/Restrictions Comments: L hemiparesis Restrictions Weight Bearing Restrictions Per Provider Order: No General:   Vital Signs:   Pain:0/10       Therapy/Group: Individual Therapy  Minna Dumire P Yehonatan Grandison 02/14/2024, 10:34 AM

## 2024-02-15 DIAGNOSIS — I639 Cerebral infarction, unspecified: Secondary | ICD-10-CM | POA: Diagnosis not present

## 2024-02-15 LAB — GLUCOSE, CAPILLARY
Glucose-Capillary: 94 mg/dL (ref 70–99)
Glucose-Capillary: 123 mg/dL — ABNORMAL HIGH (ref 70–99)
Glucose-Capillary: 223 mg/dL — ABNORMAL HIGH (ref 70–99)
Glucose-Capillary: 118 mg/dL — ABNORMAL HIGH (ref 70–99)

## 2024-02-15 NOTE — Progress Notes (Signed)
 PROGRESS NOTE   Subjective/Complaints: No evens overnight.  No PRNs used, vitals are stable.  No complaints today.  ROS: Denies fevers, chills, N/V, abdominal pain, nausea, constipation, diarrhea, SOB, cough, chest pain, new weakness or paraesthesias.   Insomnia- improved Itching: Intermittent  Objective:   No results found. Recent Labs    02/13/24 0442  WBC 8.7  HGB 10.9*  HCT 33.3*  PLT 280    Recent Labs    02/13/24 0442  NA 139  K 3.6  CL 102  CO2 24  GLUCOSE 154*  BUN 14  CREATININE 0.88  CALCIUM  8.5*    Intake/Output Summary (Last 24 hours) at 02/15/2024 0803 Last data filed at 02/14/2024 1800 Gross per 24 hour  Intake 360 ml  Output --  Net 360 ml        Physical Exam: Vital Signs Blood pressure 128/65, pulse 81, temperature 98.6 F (37 C), temperature source Oral, resp. rate 18, height 6' (1.829 m), weight 76.9 kg, SpO2 97%.  Constitutional: No apparent distress.  Lying in bed. HENT: Atraumatic, normocephalic. Cardiovascular: RRR, no murmurs/rub/gallops. No Edema.   Respiratory: Clear to auscultation bilaterally.  No rales, rhonchi, or wheezing. On RA.  Abdomen: + bowel sounds, normoactive. No distention or tenderness.  GU: Not examined. +Foley, draining clear urine.  Skin: C/D/I. No apparent lesions or rashes noted.    MSK:      No apparent deformity.       Neurologic exam:  AAO to person, place; time with cues. + Mild to moderate cognitive deficits--waxes and wanes, consistently more lethargic/confused in the morning   -Able to perform basic math, to complex reasoning, abstraction; remembers 1/3 items at 5 minutes  Insight: Fair insight into current condition.  Mood: Pleasant affect, appropriate mood.  Sensation: To light touch intact in BL UEs and LEs  Reflexes: Negative Hoffman's and babinski signs bilaterally.  CN: 2-12 grossly intact.  Coordination: Mild left upper and lower  extremity ataxia unchanged Spasticity: MAS 0 in all extremities.   Strength; antigravity against resistance bilateral upper and lower extremities, 4-5- /5 in left upper and lower extremities, 5/5 in right upper and lower extremities   Physical exam unchanged from the above on reexamination 02/15/24    Assessment/Plan: 1. Functional deficits which require 3+ hours per day of interdisciplinary therapy in a comprehensive inpatient rehab setting. Physiatrist is providing close team supervision and 24 hour management of active medical problems listed below. Physiatrist and rehab team continue to assess barriers to discharge/monitor patient progress toward functional and medical goals  Care Tool:  Bathing    Body parts bathed by patient: Right arm, Chest, Left arm, Abdomen, Face, Right upper leg, Left upper leg, Right lower leg, Left lower leg, Front perineal area, Buttocks   Body parts bathed by helper: Front perineal area, Buttocks     Bathing assist Assist Level: Contact Guard/Touching assist     Upper Body Dressing/Undressing Upper body dressing   What is the patient wearing?: Pull over shirt    Upper body assist Assist Level: Set up assist    Lower Body Dressing/Undressing Lower body dressing      What is the patient wearing?:  Pants, Incontinence brief     Lower body assist Assist for lower body dressing: Moderate Assistance - Patient 50 - 74%     Toileting Toileting    Toileting assist Assist for toileting: 2 Helpers     Transfers Chair/bed transfer  Transfers assist     Chair/bed transfer assist level: Minimal Assistance - Patient > 75%     Locomotion Ambulation   Ambulation assist      Assist level: 2 helpers Assistive device: Walker-rolling Max distance: 112'   Walk 10 feet activity   Assist     Assist level: 2 helpers Assistive device: Walker-rolling   Walk 50 feet activity   Assist    Assist level: 2 helpers Assistive device:  Walker-rolling    Walk 150 feet activity   Assist Walk 150 feet activity did not occur: Safety/medical concerns (Max distance 112')         Walk 10 feet on uneven surface  activity   Assist Walk 10 feet on uneven surfaces activity did not occur: Safety/medical concerns         Wheelchair     Assist Is the patient using a wheelchair?: Yes Type of Wheelchair: Manual    Wheelchair assist level: Dependent - Patient 0%      Wheelchair 50 feet with 2 turns activity    Assist        Assist Level: Dependent - Patient 0%   Wheelchair 150 feet activity     Assist      Assist Level: Dependent - Patient 0%   Blood pressure 128/65, pulse 81, temperature 98.6 F (37 C), temperature source Oral, resp. rate 18, height 6' (1.829 m), weight 76.9 kg, SpO2 97%.  1. Functional deficits secondary to acute pontine infarct             -patient may  shower             -ELOS/Goals: 9-12 days Min A to supervision -  11/8               - stable to continue CIR  - 10/27: Will recheck U/A/culture, does not appear as oriented this AM as prior notes, Discussed with therapy- not felt to be off his usual baseline as he waxes and waves, nursing reports oriented x3 later in day, continue to monitor    - 10/28: Per therapies at cognitive baseline. Min A bed mobility - up to 300 ft with RW with Min A. Self-limiting and some cognitive issues. Setup UBD, L lateral lean with sitting and standing. Poor motivation /apathy huge. Some L inattention. Moderate cognitive deficits,  effected by self-limiting and poor participation. Very defiant behaviorally.   - Behavior plan placed by team  - Therapy teams planning therapy schedule in the late morning and early afternoon due to consistent a.m. lethargy and confusion; wife states this is consistent with how he is at home, generally does not wake up before lunch.   - 11/4: Fluctuant, Max A to supervision for bed mobility depending on mood and  motivation. Walking less up to 60 ft, increasing L lateral lean noted. Behaviorally less engaged the longer he is here. Flustuates in OT depending on time of day.   SNF suggested, although appears patient does not have many days left.  Min A UB/Mod A LB goals. Has no buy-in with SLP. Family training tomorrow.   2.  Antithrombotics: -DVT/anticoagulation:  Pharmaceutical: Lovenox              -  antiplatelet therapy: Aspirin  and  Plavix  until 10/28 then aspirin  alone--finished Plavix    3. Pain Management: Oxycodone  and Tylenol  as needed--well-controlled   4. Mood/Behavior/Sleep: LCSW to follow for evaluation and support when available.              -antipsychotic agents: Lexapro  10 mg daily - Delirium : Seroquel  25 mg nightly--tolerating well  --waxes/wanes - 10/28: Move seroquel  to PRN, add mirtazepine 15 mg at bedtime for sleep and mood 10/29: Poor sleep since removal of seroquel ; resume at 25 mg at bedtime 10/30: Overly sedated this a.m.  Reduce mirtazapine to 7.5 mg nightly.  Therapies adjusting for afternoon schedules 10-31: Again, increasingly lethargic into the later afternoon.  DC Seroquel , continue mirtazapine 7.5 mg nightly, change as needed to Benadryl  25 mg given patient reports of itching sensation as he falls asleep without objective rashes. 11/1 reports slept better yesterday, doesn't appear that he got as needed Benadryl  11-4: Patient reports better sleep, monitor--doing well with current medications.  Behaviorally, engaging less with therapies, feels he is mostly self-limited and has little therapy buy-in at this point.  Mood, interaction is appropriate on my exam.   - Would consider increase in Lexapro , has history of severe headaches with sertraline so would not change agent.  Defer to outpatient since we just started mirtazapine   5. Neuropsych/cognition: This patient may be intermittently capable of making decisions on their own behalf.    - 11-4: Consistently better in the  afternoons in the late morning; consistent with reports from his wife.  6. Skin/Wound Care: Routine pressure relief measures             -Stage 2 Sacral wound--wound care orders placed--Gerhardt's butt cream +Zinc  PO   7. Fluids/Electrolytes/Nutrition: Monitor intake and output.  CBC/CMET in a.m.             - SLP consult             -GERD: Continue PPI   -10-25: Albumin 2.4.  Encourage p.o. intakes.  - BMP ordered 10-29 due to orthostasis, possible hypovolemia--a.m. labs stable.  10/30: Cr increase to 1.1; give 1L IVF today; BMP in AM   10-31: BMP pending, encouraging p.o. fluids  11/2 rechecked BMP yesterday- Cr slightly down to 1.08, encourage oral fluids  11-3: Creatinine 0.8.  Resolved, monitor  8.  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  Atorvastatin  80 mg daily.   9.  Acute metabolic encephalopathy: Secondary to aspiration pneumonia and hypotension.  Mentation improved continue to monitor.   10. Hypomagnesia: Mg 1.4<2.0<1.5< now 1.6 monitor and replace.    11.  Bladder outlet obstruction: Chronic foley catheter last changed 10/18; 10/31             -continue CHG and foley catheter care.  - f/u with urology outpatient.    12.  HTN: resumed home Losartan  25 mg daily, monitor BP with increased activity for hypotension    - 10-26: BP slightly elevated this a.m., back down this afternoon.  Monitor    -10/27 BP controlled, overall, continue current regimen   10-28: Orthostatic hypotension with therapies, severe.  Hold losartan  for tomorrow AM.  Encourage p.o. fluids today.  - 10/29: Hypertensive overnight but orthostatic with therapies consistnetly 10/30:  give 1L IVF today, teds, binder   10-31: BP slightly improved, remains low out of bed with teds, patient asymptomatic.  Encouraging p.o. fluids, may need additional bolus   BP controlled overall, continue current  regimen      02/14/2024    9:37 PM 02/14/2024    5:31 PM 02/14/2024     5:24 AM  Vitals with BMI  Systolic 128 128 860  Diastolic 65 77 57  Pulse 81 88 60     13.  T2DM: A1c 8.5% on 10/7  -monitor cbgs ac/hs with Novolog  SSI and Lantus  15 units  -10-26: Blood sugars consistently elevated, increase Semglee  to 20 units daily -10/27 increase semglee  to 23 units - 10/28 appears semglee  was Dced?? Add back at 23 U -10-29: Low in a.m., apparent under coverage for meals.  Resume home metformin  500 mg twice daily.  May need more Premeal added. 10/30: BG better controlled with current regimen, monitor 11/4: BG elevated recently; increase Semglee  to 25 units 11-5: PM low of 74, monitor for recurrence today. Recent Labs    02/14/24 1728 02/14/24 2133 02/15/24 0620  GLUCAP 154* 74 94     14.CAD/HLD: Atorvastatin    15.  Severe sepsis due to aspiration RLL PNA: Initially treated with IV meropenem now transition to p.o. Augmentin  ends 10/24.  ID consulted, Dr. Fleeta Rothman recommended exchanging Foley due to fungemia.             - Aspiration precautions             - Palliative care consult: Patient remains DNR limited  - 10-31: Foley again exchanged, culture positive for less than 70,000 colonies of yeast, Foley exchange should have been sufficient.  Monitor.   16. Cholelithiasis: Maybe chronic, no current GI symptoms. Continue to monitor.    17.  Heart block: S/p PPM    - Heart rate well-controlled, monitor    02/14/2024    9:37 PM 02/14/2024    5:31 PM 02/14/2024    5:24 AM  Vitals with BMI  Systolic 128 128 860  Diastolic 65 77 57  Pulse 81 88 60    18.  Constipation/neurogenic bowel.  Last bowel movement 10-20.  Wife endorses after his last stroke, he was incontinent, improved after leaving hospital.  - Start Senokot S1 tab twice daily   - Miralax  PRN--10-26 add to daily 11-3: Bowel movement, continent.  Seems to be slowing down.  19. Itching -Reports after he was given chlorhexadine or similar type product?, discussed nursing, will not use this today.   Chlorhexidine  was discontinued -Benedryl prn, sarna prn--used twice on 11-2.  No obvious rash on exam. -Resolved 11-5 LOS: 12 days A FACE TO FACE EVALUATION WAS PERFORMED  Joesph JAYSON Likes 02/15/2024, 8:03 AM

## 2024-02-15 NOTE — Progress Notes (Signed)
 Physical Therapy Session Note  Patient Details  Name: John [name] MRN: [id number] Date of Birth: 05-28-1945  Today's Date: 02/15/2024 PT Individual Time: 1345-1448 PT Individual Time Calculation (min): 63 min   Short Term Goals: Week 2:  PT Short Term Goal 1 (Week 2): STG = LTG due to ELOS  Skilled Therapeutic Interventions/Progress Updates:    Pt presents in apartment suite handoff from OT with pt sitting in WC. Pt wife present for family education. Session focused on therapeutic activities to facilitate DC planning with education provided on pt current functional progress, expected assistance needed at home, and limited participation. Pt wife expressing concerns over pt lack of motivation to participate as well as decreased insight into deficits with therapist providing education on pt participation being a barrier to progress in this setting with framing possible improvement at home due to being in familiar environment with activities of interest. Pt completes transfers throughout session with min assist from [facility] and bed level with RW. Pt requires max assist for transfer from arm chair 18 height. Pt completes gait 200' with RW CGA/light min assist, therapist providing cues for LLE foot placement with turns, pt wife assisting with ambulating with pt, therapist provides cues to pt wife on guarding techniques. Pt completes up/down 3 steps with min assist and cues for LLE foot placement with ascending and descending stairs. Pt takes seated rest break and completes again with pt wife who provides assistance, therapist providing CGA for safety and cues for guarding techniques. Pt then completes car transfer with RW with min assist to stand from simulated personal vehicle height, pt and pt wife educated on use of a handybar to assist with push to stand. Pt returned to room, completes bed mobility sit to supine with min assist and cues from therapist for repositioning in supine as well as education  with pt wife on purchasing bed rails to allow pt to reposition self. Pt completes supine to sit with max elevated HOB and max cues for sequencing, pt able to complete with CGA and maintains sitting balance EOB this session with supervision. Therapist reiterates improved performance in afternoon with increased alertness. Pt completes stand step transfer to [facility] with RW with min assist. Pt remains seated in [facility] with all needs within reach, cal light in place and chair alarm donned and activated at end of session.   Therapy Documentation Precautions:  Precautions Precautions: Fall Precaution Booklet Issued: No Recall of Precautions/Restrictions: Impaired Precaution/Restrictions Comments: L hemiparesis Restrictions Weight Bearing Restrictions Per Provider Order: No   Therapy/Group: Individual Therapy  Reche Ohara PT, DPT 02/15/2024, 4:22 PM

## 2024-02-15 NOTE — Progress Notes (Signed)
 Speech Language Pathology Daily Session Note  Patient Details  Name: John [name] MRN: [id number] Date of Birth: [birth year]  Today's Date: 02/15/2024 SLP Individual Time: 1500-1533 SLP Individual Time Calculation (min): 33 min  Short Term Goals: Week 2: SLP Short Term Goal 1 (Week 2): STGs = LTGs d/t ELOS  Skilled Therapeutic Interventions:   Pt and his wife greeted at bedside for tx targeting cognition and family education. Pt and wife appeared frustrated upon SLP arrival, anticipate this was d/t PT/OT family education. SLP attempted to facilitate conversation w/ pt re ST goals and reason for continued therapy, but he continues to report no buy in to speech therapy and no need for the nonsense. However, when challenged to recall any education/tasks/information provided in recent tx sessions, he was unable to do so. SLP provided education to the pt and his wife re current cognitive deficits and their negative impact on his return to prev roles/responsibilities at home. Additionally, he continues to demonstrate no awareness into the severity of his physical deficits and his wife's inability to assist him at home d/t safety concerns for them both. SLP also provided extensive education to the pt re his current level of care and negative impact of his self limiting behaviors that persist while here at CIR. He required errorless learning for reasoning and appeared very frustrated w/ this SLP. He stated multiple times that he's doing the best he can. At the end of education, he was left in his [facility] w/ the alarm set and call light within reach. SLP remains concerned for his wife's ability to care for him at home if his behavior persists. Recommend cont ST per POC.   Pain  None reported  Therapy/Group: Individual Therapy  [name] 02/15/2024, 4:28 PM

## 2024-02-15 NOTE — Plan of Care (Signed)
 Problem: Consults Goal: RH GENERAL PATIENT EDUCATION Description: See Patient Education module for education specifics. Outcome: Progressing   Problem: RH BOWEL ELIMINATION Goal: RH STG MANAGE BOWEL WITH ASSISTANCE Description: STG Manage Bowel with supervision Assistance. Outcome: Progressing   Problem: RH BLADDER ELIMINATION Goal: RH STG MANAGE BLADDER WITH ASSISTANCE Description: STG Manage Bladder With supervision  Assistance Outcome: Progressing   Problem: RH SKIN INTEGRITY Goal: RH STG SKIN FREE OF INFECTION/BREAKDOWN Description: Manage skin free of infection with supervision assistance Outcome: Progressing   Problem: RH SAFETY Goal: RH STG ADHERE TO SAFETY PRECAUTIONS W/ASSISTANCE/DEVICE Description: STG Adhere to Safety Precautions With supervision  Assistance/Device. Outcome: Progressing   Problem: RH PAIN MANAGEMENT Goal: RH STG PAIN MANAGED AT OR BELOW PT'S PAIN GOAL Description: <4 w/ prns Outcome: Progressing   Problem: RH KNOWLEDGE DEFICIT GENERAL Goal: RH STG INCREASE KNOWLEDGE OF SELF CARE AFTER HOSPITALIZATION Description: Manage increase  knowledge of self care after hospitalization with supervision assistance from wife using educational materials provided Outcome: Progressing

## 2024-02-15 NOTE — Progress Notes (Incomplete Revision)
 Patient ID: John [name], male   DOB: 08-30-1945, 78 y.o.   MRN: [id number]  1204-SW returned phone call to pt wife and left message requesting return phone call. SW left message on home phone as well and waiting on follow-up.   *SW spoke with  [name], MSW, LCSW Office: [phone] Cell: [phone] Fax: [phone]

## 2024-02-15 NOTE — Progress Notes (Signed)
 Occupational Therapy Session Note  Patient Details  Name: John [name] MRN: [id number] Date of Birth: 1945/07/04  Today's Date: 02/15/2024 OT Individual Time: 8694-8654 OT Individual Time Calculation (min): 40 min    Short Term Goals: Week 2:  OT Short Term Goal 1 (Week 2): Pt will complete full-body bathing with Min A. OT Short Term Goal 2 (Week 2): Pt will complete 3/3 toileting tasks with Min A. OT Short Term Goal 3 (Week 2): Pt will complete ambulatory transfers with CGA + LRAD.  Skilled Therapeutic Interventions/Progress Updates:   Pt received sitting in TIS WC, no reports/indications of pain during session. In ADL apartment, pt completes walk-in-shower transfer with Min A + max multimodal cuing for sequencing of transfer due to newness. Spouse arriving ~15 minutes past start of scheduled session due to personal reasons. Majority of remaining session focused on verbal caregiver education with spouse in regards to current level of ADL functioning and barriers to success (I.e motivation to participate in sessions). Spouse reports concerns with ability to provide care, education provided on alternatives such as SNF and/or hired care. Spouse reports being available to complete additional training on 11/7. Patient and OT demo's walk-in-shower transfer. Treatment team updated on discussion with spouse. Pt remained in care of PT for continued education.   Therapy Documentation Precautions:  Precautions Precautions: Fall Precaution Booklet Issued: No Recall of Precautions/Restrictions: Impaired Precaution/Restrictions Comments: L hemiparesis Restrictions Weight Bearing Restrictions Per Provider Order: No   Therapy/Group: Individual Therapy  [name], OTR/L, MSOT  02/15/2024, 7:01 AM

## 2024-02-15 NOTE — Progress Notes (Signed)
 Patient ID: John [name], male   DOB: 1946/03/27, 78 y.o.   MRN: [id number]  1204-SW returned phone call to pt wife and left message requesting return phone call. SW left message on home phone as well and waiting on follow-up.   [name], MSW, LCSW Office: [phone] Cell: [phone] Fax: [phone]

## 2024-02-15 NOTE — Progress Notes (Signed)
 Physical Therapy Session Note  Patient Details  Name: John [name] MRN: [id number] Date of Birth: Aug 27, 1945  Today's Date: 02/15/2024 PT Individual Time: [phone] (actual time in room [phone]) PT Individual Time Calculation (min): 47 min   Short Term Goals: Week 2:  PT Short Term Goal 1 (Week 2): STG = LTG due to ELOS  Skilled Therapeutic Interventions/Progress Updates:    Pt presents in room in bed, awake and agreeable to PT to mobilize OOB to sit in [facility] to eat breakfast. Pt frustrated therapist has not come earlier to assist pt to [facility] for breakfast, therapist provides education on purpose of PT. Therapist attempts to facilitate improved body mechanics for bed mobility and seated balance with max cues however pt continues to require mod assist for bed mobility and min/mod assist for seated balance as pt demonstrating posterior and R lateral LOB, pt able to maintain upright without assistance for ~10 seconds before LOB. Therapist provides total assist for donning shoes while seated EOB, provides mod assist for transfer to stand with pt demonstrating decreased pain in low back with mobility this AM. Pt completes transfer to [facility] with mod assist with RW. Pt set up for breakfast and pt missing 28 min out of 75 min session secondary to pt eating breakfast, therapist checking throughout this time to assess if pt finished.  Therapist reenters room at 1046 with pt reporting finished with breakfast and agreeable to PT. Session at this point focused on gait training and NMR for core strengthening and seated balance to improve participation with bed mobility and transfers. Pt does continue to require encouragement throughout session for participation with pt stating you know you can't expect me to perform well this afternoon if you're making me work now. Pt provided with education on role of specific exercises and their functional benefit as well as 3 hour rule and missing time with therapy due  to eating breakfast with pt verbalizing understanding. Pt transported to gym, completes gait 95' with RW with min assist, pt demonstrating decreased proximity to RW with fatigue and decreasing LLE foot clearance. Pt completes stand step transfer with mod assist to EOM where pt completes seated therex as NMR to promote sitting balance and core strengthening needed for functional transfers and bed mobility including: - sit ups x10 from modified reclined short sitting position - lateral to elbows x10 BUE - palloff press x10 2# med ball - seated alternating marches unsupported sitting x20 Pt completes transfer back to [facility] with mod assist with RW. Pt returns to room and remains seated in [facility] with all needs within reach, cal light in place and chair alarm donned and activated at end of session.   Therapy Documentation Precautions:  Precautions Precautions: Fall Precaution Booklet Issued: No Recall of Precautions/Restrictions: Impaired Precaution/Restrictions Comments: L hemiparesis Restrictions Weight Bearing Restrictions Per Provider Order: No General: PT Amount of Missed Time (min): 28 Minutes PT Missed Treatment Reason: Other (Comment);Patient unwilling to participate (pt requesting to eat breakfast)   Therapy/Group: Individual Therapy  Reche Ohara PT, DPT 02/15/2024, 12:14 PM

## 2024-02-15 NOTE — Plan of Care (Signed)
 Goals downgraded d/t progress thus far   Problem: RH Awareness Goal: LTG: Patient will demonstrate awareness during functional activites type of (SLP) Description: LTG: Patient will demonstrate awareness during functional activites type of (SLP) Outcome: Not Applicable   Problem: RH Memory Goal: LTG Patient will use memory compensatory aids to (SLP) Description: LTG:  Patient will use memory compensatory aids to recall biographical/new, daily complex information with cues (SLP) Flowsheets (Taken 02/15/2024 1642) LTG: Patient will use memory compensatory aids to (SLP): Maximal Assistance - Patient 25 - 49%   Problem: RH Attention Goal: LTG Patient will demonstrate this level of attention during functional activites (SLP) Description: LTG:  Patient will will demonstrate this level of attention during functional activites (SLP) Flowsheets Taken 02/15/2024 1642 by Berna Recardo LABOR, CCC-SLP Patient will demonstrate during cognitive/linguistic activities the attention type of: Sustained LTG: Patient will demonstrate this level of attention during cognitive/linguistic activities with assistance of (SLP): Moderate Assistance - Patient 50 - 74% Number of minutes patient will demonstrate attention during cognitive/linguistic activities: 10 Taken 02/04/2024 1256 by Lars Joane RAMAN, CCC-SLP Patient will demonstrate this level of attention during cognitive/linguistic activities in: Controlled

## 2024-02-16 DIAGNOSIS — I639 Cerebral infarction, unspecified: Secondary | ICD-10-CM | POA: Diagnosis not present

## 2024-02-16 LAB — CBC
HCT: 34.5 % — ABNORMAL LOW (ref 39.0–52.0)
Hemoglobin: 11.2 g/dL — ABNORMAL LOW (ref 13.0–17.0)
MCH: 29.7 pg (ref 26.0–34.0)
MCHC: 32.5 g/dL (ref 30.0–36.0)
MCV: 91.5 fL (ref 80.0–100.0)
Platelets: 250 K/uL (ref 150–400)
RBC: 3.77 MIL/uL — ABNORMAL LOW (ref 4.22–5.81)
RDW: 16.5 % — ABNORMAL HIGH (ref 11.5–15.5)
WBC: 8.8 K/uL (ref 4.0–10.5)
nRBC: 0 % (ref 0.0–0.2)

## 2024-02-16 LAB — BASIC METABOLIC PANEL WITH GFR
Anion gap: 11 (ref 5–15)
BUN: 25 mg/dL — ABNORMAL HIGH (ref 8–23)
CO2: 25 mmol/L (ref 22–32)
Calcium: 8.9 mg/dL (ref 8.9–10.3)
Chloride: 106 mmol/L (ref 98–111)
Creatinine, Ser: 1.19 mg/dL (ref 0.61–1.24)
GFR, Estimated: >60 mL/min (ref >60)
Glucose, Bld: 95 mg/dL (ref 70–99)
Potassium: 3.6 mmol/L (ref 3.5–5.1)
Sodium: 142 mmol/L (ref 135–145)

## 2024-02-16 LAB — GLUCOSE, CAPILLARY
Glucose-Capillary: 88 mg/dL (ref 70–99)
Glucose-Capillary: 130 mg/dL — ABNORMAL HIGH (ref 70–99)
Glucose-Capillary: 207 mg/dL — ABNORMAL HIGH (ref 70–99)
Glucose-Capillary: 151 mg/dL — ABNORMAL HIGH (ref 70–99)

## 2024-02-16 MED ORDER — INSULIN GLARGINE-YFGN 100 UNIT/ML ~~LOC~~ SOLN: 20.0000 [IU] | Freq: Every day | SUBCUTANEOUS | Status: DC

## 2024-02-16 MED ORDER — EMPAGLIFLOZIN 10 MG PO TABS
10.0000 mg | ORAL_TABLET | Freq: Every day | ORAL | Status: DC
Start: 2024-02-16 — End: 2024-02-16

## 2024-02-16 MED ORDER — DONEPEZIL HCL 5 MG PO TABS
5.0000 mg | ORAL_TABLET | Freq: Every day | ORAL | Status: DC
  Administered 2024-02-16 – 2024-02-19 (×4): 5 mg via ORAL
  Filled 2024-02-16 (×4): qty 1

## 2024-02-16 MED ORDER — INSULIN GLARGINE-YFGN 100 UNIT/ML ~~LOC~~ SOLN
25.0000 [IU] | Freq: Every day | SUBCUTANEOUS | Status: DC
  Administered 2024-02-17 – 2024-02-19 (×3): 25 [IU] via SUBCUTANEOUS
  Filled 2024-02-16 (×3): qty 0.25

## 2024-02-16 NOTE — Progress Notes (Signed)
 Physical Therapy Session Note  Patient Details  Name: John [name] MRN: [id number] Date of Birth: 1945/09/11  Today's Date: 02/16/2024 PT Individual Time: 1003-1050 PT Individual Time Calculation (min): 47 min   Short Term Goals: Week 2:  PT Short Term Goal 1 (Week 2): STG = LTG due to ELOS  Skilled Therapeutic Interventions/Progress Updates:    Pt presents in room in bed, reporting needing to get changed because he has had an incontinent bowel movement. Pt states that he is sometimes able to tell when he has the urge to go however this instance pt was unable to hold. Pt educated on pressing call light for assistance if having had BM due to infection risk with pt verbalizing understanding. Session focused on therapeutic activities to promote standing tolerance and standing balance needed for self care tasks as well as transfer training. Pt completes bed mobility with supervision, improved initiation of bed mobility however does require cues for scooting to EOB. Pt completes sit to stand with min/mod assist to RW, max cues for anterior wt shift and upright posture to allow for therapist to complete periarea hygiene with total assist. Pt able to maintain standing for ~61min with increased assist up to mod assist for balance with fatigue with retropulsion noted. Pt completes 2nd stand for ~1.5 min with significant trunk flexion unable to correct to upright posture with fatigue. Therapist notifies RN for assistance with periarea hygiene for foley care, therapist maintains pt upright in standing x2 minutes with mod assist while RN provides periarea hygiene. Due to fatigue, RW switched for stedy with pt able to maintain upright with increased ease with stedy, maintains high perched posture seated on posterior pads, demonstrating increased L lateral lean with cues from therapist for correcting to midline. Pt completes transfer to [facility] and therapist dons TED hose, new pants, shoes and new brief with max assist.  Pt able to manage changing shirt with min assist. Pt completes stand for pulling pants and brief over hips. Pt remains seated in WC with all needs within reach, cal light in place and chair alarm donned and activated at end of session.     Therapy Documentation Precautions:  Precautions Precautions: Fall Precaution Booklet Issued: No Recall of Precautions/Restrictions: Impaired Precaution/Restrictions Comments: L hemiparesis Restrictions Weight Bearing Restrictions Per Provider Order: No    Therapy/Group: Individual Therapy  Reche Ohara PT, DPT 02/16/2024, 2:06 PM

## 2024-02-16 NOTE — Progress Notes (Signed)
 Speech Language Pathology Discharge Summary  Patient Details  Name: John [name] MRN: [id number] Date of Birth: 1945-09-19  Date of Discharge from SLP service:February 20, 2024  Patient has met 4 of 4 long term goals.  Patient to discharge at overall Mod level.  Reasons goals not met: n/a   Clinical Impression/Discharge Summary:  Despite meeting 4/4 LTGs, minimal to no progress noted this stay d/t pt participation and behaviors. Pt pariticipation continues to negatively impact differentiation between behaviors and true cognitive deficits, however, he continues to present w/ moderate cognitive deficits in the areas of attention, initiation, STM, and problem solving. He also continues to demonstrate no insight into physical/cognitive deficits. He will require 24/7 supervision and SNF is recommended d/t burden of care. Pt/family education complete. Pending pt participation, he would benefit from continued ST upon d/c to target remaining cognitive deficits, maximize pt independence, and reduce caregiver burden.   Care Partner:  Caregiver Able to Provide Assistance: No  Type of Caregiver Assistance: Cognitive;Physical  Recommendation:  Skilled Nursing facility;24 hour supervision/assistance  Rationale for SLP Follow Up: Maximize cognitive function and independence;Reduce caregiver burden   Equipment: n/a   Reasons for discharge: Discharged from hospital   Patient/Family Agrees with Progress Made and Goals Achieved: Yes (wife)    [name] 02/16/2024, 6:41 PM

## 2024-02-16 NOTE — Progress Notes (Signed)
 Physical Therapy Session Note  Patient Details  Name: John [name] MRN: [id number] Date of Birth: Jan 02, 1946  Today's Date: 02/16/2024 PT Individual Time: 1130-1200 PT Individual Time Calculation (min): 30 min   Short Term Goals: Week 2:  PT Short Term Goal 1 (Week 2): STG = LTG due to ELOS  Skilled Therapeutic Interventions/Progress Updates: Pt presented in TIS agreeable to therapy. Pt denies pain during session. Session focused on general conditioning performing tasks on Cybex Kinetron. Pt transported to dayroom for time management. Pt set up w/c level at Cybex Kinetron 50cm/sec. Pt participated in cycles with 45 sec break between bouts x 6 cycles. Pt encouraged to push through full range for increased ms activation. Pt transported back to room at end of session and agreeable to remain in TIS with belt alarm on, call bell within reach and needs met.      Therapy Documentation Precautions:  Precautions Precautions: Fall Precaution Booklet Issued: No Recall of Precautions/Restrictions: Impaired Precaution/Restrictions Comments: L hemiparesis Restrictions Weight Bearing Restrictions Per Provider Order: No General:   Vital Signs: Therapy Vitals Temp: (!) 97.4 F (36.3 C) Pulse Rate: 76 Resp: 18 BP: (!) 135/90 Patient Position (if appropriate): Sitting Oxygen Therapy SpO2: 98 % O2 Device: Room Air     Therapy/Group: Individual Therapy  [name] 02/16/2024, 4:16 PM

## 2024-02-16 NOTE — Progress Notes (Signed)
 Occupational Therapy Session Note  Patient Details  Name: John [name] MRN: [id number] Date of Birth: [birth year]  Today's Date: 02/16/2024 OT Individual Time: 1305-1400 OT Individual Time Calculation (min): 55 min    Short Term Goals: Week 2:  OT Short Term Goal 1 (Week 2): Pt will complete full-body bathing with Min A. OT Short Term Goal 2 (Week 2): Pt will complete 3/3 toileting tasks with Min A. OT Short Term Goal 3 (Week 2): Pt will complete ambulatory transfers with CGA + LRAD.  Skilled Therapeutic Interventions/Progress Updates:   Pt greeted sitting in TIS WC for skilled OT session with focus on standing balance/tolerance and LUE NMR for carryover into ADL independence. Pt dependently transported to day room for [facility] themed activities. Pt stands for 5x63min reps of multiple games with CGA, occasional Min A due to posterior LOB/bias. LUE used to place playing pieces  intermediately (requiring Max cuing for encouragement to use limb). Pt demos ~100 degrees of active shoulder flexion for the above, requiring OT assisting to reach end-range. Sit<>stands with Min A, stand-step transfers with CGA + RW. Session transitioned to ROM/strengthening of BUE, pt completing 1x10 reps of shoulder flexion (limited AROM) and shoulder abduction, all using 1# dowel bar. 1x10 reps of chest press utlized to supplement the above exercises. Pt requires multimodal cuing for form/correct musculature activation. Pt remained sitting in TIS WC with 4Ps assessed and immediate needs met. Pt continues to be appropriate for skilled OT intervention to promote further functional independence in ADLs/IADLs.   Therapy Documentation Precautions:  Precautions Precautions: Fall Precaution Booklet Issued: No Recall of Precautions/Restrictions: Impaired Precaution/Restrictions Comments: L hemiparesis Restrictions Weight Bearing Restrictions Per Provider Order: No   Therapy/Group: Individual Therapy  [name], OTR/L, MSOT  02/16/2024, 6:58 AM

## 2024-02-16 NOTE — Plan of Care (Signed)
 Problem: Consults Goal: RH GENERAL PATIENT EDUCATION Description: See Patient Education module for education specifics. Outcome: Progressing   Problem: RH BOWEL ELIMINATION Goal: RH STG MANAGE BOWEL WITH ASSISTANCE Description: STG Manage Bowel with supervision Assistance. Outcome: Progressing   Problem: RH BLADDER ELIMINATION Goal: RH STG MANAGE BLADDER WITH ASSISTANCE Description: STG Manage Bladder With supervision  Assistance Outcome: Progressing   Problem: RH SKIN INTEGRITY Goal: RH STG SKIN FREE OF INFECTION/BREAKDOWN Description: Manage skin free of infection with supervision assistance Outcome: Progressing   Problem: RH SAFETY Goal: RH STG ADHERE TO SAFETY PRECAUTIONS W/ASSISTANCE/DEVICE Description: STG Adhere to Safety Precautions With supervision  Assistance/Device. Outcome: Progressing   Problem: RH PAIN MANAGEMENT Goal: RH STG PAIN MANAGED AT OR BELOW PT'S PAIN GOAL Description: <4 w/ prns Outcome: Progressing   Problem: RH KNOWLEDGE DEFICIT GENERAL Goal: RH STG INCREASE KNOWLEDGE OF SELF CARE AFTER HOSPITALIZATION Description: Manage increase  knowledge of self care after hospitalization with supervision assistance from wife using educational materials provided Outcome: Progressing

## 2024-02-16 NOTE — Progress Notes (Signed)
 PROGRESS NOTE   Subjective/Complaints: No evens overnight.   Much frustration with therapies yesterday regarding cognitive deficts and poor participation. Patient states he is over being in the hospital; ready for DC whether home or elsewhere. Wife reportedly looking into SNF/nursing homes.   AM labs with elevated BUN, Cr. Others stable.  HTN 1x this AM; otehrwise normotensive  Foley OP appropriate, BM 11/5 incontinent and large  ROS: Denies fevers, chills, N/V, abdominal pain, nausea, constipation, diarrhea, SOB, cough, chest pain, new weakness or paraesthesias.   Insomnia- improved/resolved  Objective:   No results found. Recent Labs    02/16/24 0505  WBC 8.8  HGB 11.2*  HCT 34.5*  PLT 250    Recent Labs    02/16/24 0505  NA 142  K 3.6  CL 106  CO2 25  GLUCOSE 95  BUN 25*  CREATININE 1.19  CALCIUM  8.9    Intake/Output Summary (Last 24 hours) at 02/16/2024 9077 Last data filed at 02/16/2024 9378 Gross per 24 hour  Intake 340 ml  Output 1550 ml  Net -1210 ml        Physical Exam: Vital Signs Blood pressure (!) 159/62, pulse 60, temperature 97.7 F (36.5 C), temperature source Oral, resp. rate 18, height 6' (1.829 m), weight 76.9 kg, SpO2 98%.  Constitutional: No apparent distress. Sitting up in WC HENT: Atraumatic, normocephalic. Cardiovascular: RRR, no murmurs/rub/gallops. No Edema.   Respiratory: Clear to auscultation bilaterally.  No rales, rhonchi, or wheezing. On RA.  Abdomen: + bowel sounds, normoactive. No distention or tenderness.  GU: Not examined. +Foley, draining clear urine.  Skin: C/D/I. No apparent lesions or rashes noted.    MSK:      No apparent deformity.       Neurologic exam:  AAO to person, place; time with cues. + Mild to moderate cognitive deficits--waxes and wanes, consistently more lethargic/confused in the morning   -Able to perform basic math, to complex reasoning,  abstraction; remembers 1/3 items at 5 minutes  Insight: Fair insight into current condition.  Mood: Pleasant affect, appropriate mood.  Sensation: To light touch intact in BL UEs and LEs  Reflexes: Negative Hoffman's and babinski signs bilaterally.  CN: 2-12 grossly intact.  Coordination: Mild left upper and lower extremity ataxia unchanged Spasticity: MAS 0 in all extremities.   Strength; antigravity against resistance bilateral upper and lower extremities, 4-5- /5 in left upper and lower extremities, 5/5 in right upper and lower extremities   Physical exam unchanged from the above on reexamination 02/16/24    Assessment/Plan: 1. Functional deficits which require 3+ hours per day of interdisciplinary therapy in a comprehensive inpatient rehab setting. Physiatrist is providing close team supervision and 24 hour management of active medical problems listed below. Physiatrist and rehab team continue to assess barriers to discharge/monitor patient progress toward functional and medical goals  Care Tool:  Bathing    Body parts bathed by patient: Right arm, Chest, Left arm, Abdomen, Face, Right upper leg, Left upper leg, Right lower leg, Left lower leg, Front perineal area, Buttocks   Body parts bathed by helper: Front perineal area, Buttocks     Bathing assist Assist Level: Contact Guard/Touching assist  Upper Body Dressing/Undressing Upper body dressing   What is the patient wearing?: Pull over shirt    Upper body assist Assist Level: Set up assist    Lower Body Dressing/Undressing Lower body dressing      What is the patient wearing?: Pants, Incontinence brief     Lower body assist Assist for lower body dressing: Moderate Assistance - Patient 50 - 74%     Toileting Toileting    Toileting assist Assist for toileting: 2 Helpers     Transfers Chair/bed transfer  Transfers assist     Chair/bed transfer assist level: Minimal Assistance - Patient > 75%      Locomotion Ambulation   Ambulation assist      Assist level: 2 helpers Assistive device: Walker-rolling Max distance: 112'   Walk 10 feet activity   Assist     Assist level: 2 helpers Assistive device: Walker-rolling   Walk 50 feet activity   Assist    Assist level: 2 helpers Assistive device: Walker-rolling    Walk 150 feet activity   Assist Walk 150 feet activity did not occur: Safety/medical concerns (Max distance 112')         Walk 10 feet on uneven surface  activity   Assist Walk 10 feet on uneven surfaces activity did not occur: Safety/medical concerns         Wheelchair     Assist Is the patient using a wheelchair?: Yes Type of Wheelchair: Manual    Wheelchair assist level: Dependent - Patient 0%      Wheelchair 50 feet with 2 turns activity    Assist        Assist Level: Dependent - Patient 0%   Wheelchair 150 feet activity     Assist      Assist Level: Dependent - Patient 0%   Blood pressure (!) 159/62, pulse 60, temperature 97.7 F (36.5 C), temperature source Oral, resp. rate 18, height 6' (1.829 m), weight 76.9 kg, SpO2 98%.  1. Functional deficits secondary to acute pontine infarct             -patient may  shower             -ELOS/Goals: 9-12 days Min A to supervision -  11/8               - stable to continue CIR  - 10/27: Will recheck U/A/culture, does not appear as oriented this AM as prior notes, Discussed with therapy- not felt to be off his usual baseline as he waxes and waves, nursing reports oriented x3 later in day, continue to monitor    - 10/28: Per therapies at cognitive baseline. Min A bed mobility - up to 300 ft with RW with Min A. Self-limiting and some cognitive issues. Setup UBD, L lateral lean with sitting and standing. Poor motivation /apathy huge. Some L inattention. Moderate cognitive deficits,  effected by self-limiting and poor participation. Very defiant behaviorally.   - Behavior  plan placed by team  - Therapy teams planning therapy schedule in the late morning and early afternoon due to consistent a.m. lethargy and confusion; wife states this is consistent with how he is at home, generally does not wake up before lunch.   - 11/4: Fluctuant, Max A to supervision for bed mobility depending on mood and motivation. Walking less up to 60 ft, increasing L lateral lean noted. Behaviorally less engaged the longer he is here. Flustuates in OT depending on time of day.  SNF suggested, although appears patient does not have many days left.  Min A UB/Mod A LB goals. Has no buy-in with SLP. Family training tomorrow.   -11/6: reportedly wife pursuing placement; will d/w team in AM  2.  Antithrombotics: -DVT/anticoagulation:  Pharmaceutical: Lovenox              -antiplatelet therapy: Aspirin  and  Plavix  until 10/28 then aspirin  alone--finished Plavix    3. Pain Management: Oxycodone  and Tylenol  as needed--well-controlled   4. Mood/Behavior/Sleep: LCSW to follow for evaluation and support when available.              -antipsychotic agents: Lexapro  10 mg daily - Delirium : Seroquel  25 mg nightly--tolerating well  --waxes/wanes - 10/28: Move seroquel  to PRN, add mirtazepine 15 mg at bedtime for sleep and mood 10/29: Poor sleep since removal of seroquel ; resume at 25 mg at bedtime 10/30: Overly sedated this a.m.  Reduce mirtazapine to 7.5 mg nightly.  Therapies adjusting for afternoon schedules 10-31: Again, increasingly lethargic into the later afternoon.  DC Seroquel , continue mirtazapine 7.5 mg nightly, change as needed to Benadryl  25 mg given patient reports of itching sensation as he falls asleep without objective rashes. 11/1 reports slept better yesterday, doesn't appear that he got as needed Benadryl  11-4: Patient reports better sleep, monitor--doing well with current medications.  Behaviorally, engaging less with therapies, feels he is mostly self-limited and has little therapy  buy-in at this point.  Mood, interaction is appropriate on my exam.   - Would consider increase in Lexapro , has history of severe headaches with sertraline so would not change agent.  Defer to outpatient since we just started mirtazapine 11/6: on chart review, Hx wellbutrin  - will d/w patient today - he endorses good mood, no SI/HI, simply tired of being in the hospital which is reasonable   5. Neuropsych/cognition: This patient may be intermittently capable of making decisions on their own behalf.    - 11-4: Consistently better in the afternoons in the late morning; consistent with reports from his wife.   - 11/6: Start aricept 5 mg at bedtime for memory decline  6. Skin/Wound Care: Routine pressure relief measures             -Stage 2 Sacral wound--wound care orders placed--Gerhardt's butt cream +Zinc  PO   7. Fluids/Electrolytes/Nutrition: Monitor intake and output.  CBC/CMET in a.m.             - SLP consult             -GERD: Continue PPI   -10-25: Albumin 2.4.  Encourage p.o. intakes.  - BMP ordered 10-29 due to orthostasis, possible hypovolemia--a.m. labs stable.  10/30: Cr increase to 1.1; give 1L IVF today; BMP in AM   10-31: BMP pending, encouraging p.o. fluids  11/2 rechecked BMP yesterday- Cr slightly down to 1.08, encourage oral fluids  11-3: Creatinine 0.8.  Resolved, monitor 11/6: cr back up, DC metformin , encourage PO  8.  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  Atorvastatin  80 mg daily.   9.  Acute metabolic encephalopathy: Secondary to aspiration pneumonia and hypotension.  Mentation improved continue to monitor.   10. Hypomagnesia: Mg 1.4<2.0<1.5< now 1.6 monitor and replace.    11.  Bladder outlet obstruction: Chronic foley catheter last changed 10/18; 10/31             -continue CHG and foley catheter care.  - f/u with urology outpatient.    12.  HTN: resumed home Losartan  25 mg daily, monitor BP with increased  activity for hypotension    - 10-26: BP slightly elevated this a.m., back down this afternoon.  Monitor    -10/27 BP controlled, overall, continue current regimen   10-28: Orthostatic hypotension with therapies, severe.  Hold losartan  for tomorrow AM.  Encourage p.o. fluids today.  - 10/29: Hypertensive overnight but orthostatic with therapies consistnetly 10/30:  give 1L IVF today, teds, binder   10-31: BP slightly improved, remains low out of bed with teds, patient asymptomatic.  Encouraging p.o. fluids, may need additional bolus   BP controlled overall, continue current regimen      02/16/2024    6:11 AM 02/15/2024    7:33 PM 02/14/2024    9:37 PM  Vitals with BMI  Systolic 159 124 871  Diastolic 62 59 65  Pulse 60 70 81     13.  T2DM: A1c 8.5% on 10/7  -monitor cbgs ac/hs with Novolog  SSI and Lantus  15 units  -10-26: Blood sugars consistently elevated, increase Semglee  to 20 units daily -10/27 increase semglee  to 23 units - 10/28 appears semglee  was Dced?? Add back at 23 U -10-29: Low in a.m., apparent under coverage for meals.  Resume home metformin  500 mg twice daily.  May need more Premeal added. 10/30: BG better controlled with current regimen, monitor 11/4: BG elevated recently; increase Semglee  to 25 units 11-5: PM low of 74, monitor for recurrence today. 11/6: AKI; DC metformin , continue semglee  25 Recent Labs    02/15/24 1739 02/15/24 2157 02/16/24 0614  GLUCAP 223* 118* 88     14.CAD/HLD: Atorvastatin    15.  Severe sepsis due to aspiration RLL PNA: Initially treated with IV meropenem now transition to p.o. Augmentin  ends 10/24.  ID consulted, Dr. Fleeta Rothman recommended exchanging Foley due to fungemia.             - Aspiration precautions             - Palliative care consult: Patient remains DNR limited  - 10-31: Foley again exchanged, culture positive for less than 70,000 colonies of yeast, Foley exchange should have been sufficient.  Monitor.   16.  Cholelithiasis: Maybe chronic, no current GI symptoms. Continue to monitor.    17.  Heart block: S/p PPM    - Heart rate well-controlled, monitor    02/16/2024    6:11 AM 02/15/2024    7:33 PM 02/14/2024    9:37 PM  Vitals with BMI  Systolic 159 124 871  Diastolic 62 59 65  Pulse 60 70 81    18.  Constipation/neurogenic bowel.  Last bowel movement 10-20.  Wife endorses after his last stroke, he was incontinent, improved after leaving hospital.  - Start Senokot S1 tab twice daily   - Miralax  PRN--10-26 add to daily 11-3: Bowel movement, continent.  Seems to be slowing down.  11/5 LBM  19. Itching -Reports after he was given chlorhexadine or similar type product?, discussed nursing, will not use this today.  Chlorhexidine  was discontinued -Benedryl prn, sarna prn--used twice on 11-2.  No obvious rash on exam. -Resolved 11-5 LOS: 13 days A FACE TO FACE EVALUATION WAS PERFORMED  John [name] 02/16/2024, 9:22 AM

## 2024-02-16 NOTE — Progress Notes (Signed)
 Patient ID: John [name], male   DOB: April 18, 1945, 78 y.o.   MRN: [id number]  SW received message from pt wife reporting that she would like for her husband to be placed in a skilled facility. She asks for SW to call after 2pm as she is in a class until this time.   SW sent out SNF referral.   1418-SW spoke with pt wife to follow-up about message from earlier today. She reiterates she is no longer able to take him home. States if unable to find a SNF that she can drive too, she will take him home. Discussed Preferred SNFs': 1) [facility], 2)[facility]-no beds, 3 )[facility],  4) [facility], and 5) [facility]- she will go scientist, forensic. Will leave SNF packet at front desk as she will be here tomorrow.   1433- SW left message for [name]/[facility] about referral and waiting on follow-up about referral.  1435- SW spoke with Soy/Admissions with [facility] and discussed referral. Will review and will follow-up.   1436- SW spoke with Christie/Admissions with [facility] to discuss referral. She will follow-up if able to accept.  1440- SW left message for Admissions with [facility] to follow-up about referral and waiting on follow-up.   [name], MSW, LCSW Office: [phone] Cell: [phone] Fax: [phone]

## 2024-02-16 NOTE — [place] FL2 (Signed)
 [place]  MEDICAID FL2 LEVEL OF CARE FORM     IDENTIFICATION  Patient Name: John [name] Birthdate: [birth year] Sex: male Admission Date (Current Location): 02/03/2024  [facility] and [place] Number:  Producer, Television/film/video and Address:  [facility] [place], [address], [place], [place] [address]      Provider Number: [id number]  Attending Physician Name and Address:  [name], DO  Relative Name and Phone Number:       Current Level of Care: Hospital Recommended Level of Care: Skilled Nursing Facility Prior Approval Number:    Date Approved/Denied:   PASRR Number:    Discharge Plan: SNF    Current Diagnoses: Patient Active Problem List   Diagnosis Date Noted   CVA (cerebral vascular accident) (HCC) 02/03/2024   Prerenal azotemia 01/28/2024   Unresponsive episode 01/28/2024   Bacterial UTI 01/28/2024   Right pontine stroke (HCC) 01/24/2024   Acute cystitis without hematuria 01/17/2024   History of CVA (cerebrovascular accident) 01/17/2024   Weakness 01/17/2024   Left-sided weakness 01/16/2024   Intractable nausea and vomiting 12/26/2023   Constipation 12/26/2023   AMS (altered mental status) 12/13/2023   Altered mental status 12/09/2023   Nausea & vomiting 12/09/2023   Status post placement of cardiac pacemaker 12/09/2023   Sepsis (HCC) 12/04/2023   CAD S/P percutaneous coronary angioplasty 12/04/2023   HFrEF (heart failure with reduced ejection fraction) (HCC) 08/17/2023   DVT, femoral, chronic (HCC) 08/15/2023   Bladder outlet obstruction 08/15/2023   Sepsis due to Escherichia coli (HCC) 08/15/2023   Debility 08/13/2023   Non-ST elevation (NSTEMI) myocardial infarction (HCC) 08/10/2023   Cholelithiasis 08/07/2023   VTE (venous thromboembolism) 08/07/2023   Lactic acidosis 08/07/2023   Sacral pressure ulcer 08/07/2023   Streptococcal bacteremia 08/07/2023   B12 deficiency 08/07/2023   Iron deficiency anemia 08/07/2023   Severe  sepsis (HCC) 08/06/2023   Chronic diastolic CHF (congestive heart failure) (HCC) 08/06/2023   Bacteriuria 08/06/2023   Acute encephalopathy 08/06/2023   Essential hypertension 06/15/2023   Situational depression 06/15/2023   Protein-calorie malnutrition, severe 05/12/2023   Heart block AV complete (HCC) 05/12/2023   Syncope 05/10/2023   Fall at home, initial encounter 05/08/2023   Hypotension 05/08/2023   Abnormal LFTs 05/08/2023   Anemia 05/08/2023   Coping style affecting medical condition 05/02/2023   Acute renal failure superimposed on chronic kidney disease 04/28/2023   Brainstem infarct, acute (HCC) 04/25/2023   Pressure injury of skin 04/25/2023   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   Acute kidney injury superimposed on chronic kidney disease 04/17/2023   DKA (diabetic ketoacidosis) (HCC) 04/16/2023   Type 2 diabetes mellitus with hyperglycemia, with long-term current use of insulin  (HCC) 07/26/2022   Bronchitis 03/20/2021   Acute non-recurrent frontal sinusitis 03/20/2021   Viral URI with cough 01/13/2021   Hyperlipidemia 11/21/2020   Dizziness and giddiness 10/20/2020   Localized swelling of both lower extremities 10/20/2020   Tendinitis of right hip flexor 10/20/2020   Aortic atherosclerosis 09/10/2020   Insomnia 09/10/2020   Cognitive impairment 08/18/2020   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   Urinary retention 06/04/2020   History of COVID-19 12/10/2019   Physical deconditioning 12/10/2019   Fatigue 12/10/2019   Unsteady gait 12/10/2019   Pneumonia due to COVID-19 virus 10/26/2019   Peripheral neuropathy 10/17/2019   Depression, major, single episode, complete remission 10/17/2019   History of breast cancer    GERD (gastroesophageal reflux disease)    BPH (benign prostatic  hyperplasia)    CTS (carpal tunnel syndrome)    Diabetic polyneuropathy associated with type 2 diabetes mellitus (HCC) 09/29/2019   Neuropathic pain of foot 09/29/2019    Foley catheter in place 01/03/2019   Peripheral sensory neuropathy 05/17/2018   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   Hypomagnesemia 01/18/2018   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) 12/13/2017   Axillary adenopathy 12/09/2017   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   S/P cataract extraction and insertion of intraocular lens 01/17/2017   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   PVD (posterior vitreous detachment), left 01/07/2017   Hyperopia of both eyes with astigmatism 11/09/2016   Class 1 obesity due to excess calories without serious comorbidity with body mass index (BMI) of 31.0 to 31.9 in adult 09/17/2016   Encounter for monitoring tamoxifen  therapy 03/05/2016   Insulin  dependent type 2 diabetes mellitus (HCC) 07/02/2015   Diabetes 1.5, 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   Palpitation 09/16/2014    Orientation RESPIRATION BLADDER Height & Weight     Self, Time, Place  Normal Indwelling catheter Weight: 169 lb 8.5 oz (76.9 kg) Height:  6' (182.9 cm)  BEHAVIORAL SYMPTOMS/MOOD NEUROLOGICAL BOWEL NUTRITION STATUS      Continent Diet  AMBULATORY STATUS COMMUNICATION OF NEEDS Skin   Limited Assist Verbally Other (Comment) (stage 2 sacram)                       Personal Care Assistance Level of Assistance  Bathing, Feeding, Dressing Bathing Assistance: Limited assistance Feeding assistance: Limited assistance Dressing Assistance: Limited assistance     Functional Limitations Info  Sight, Hearing, Speech Sight Info: Adequate Hearing Info: Adequate Speech Info: Adequate    SPECIAL CARE FACTORS FREQUENCY  PT (By licensed PT), OT (By licensed OT), Speech therapy     PT Frequency: 5xs per week OT Frequency: 5xs per week     Speech Therapy Frequency: 5xs per week      Contractures Contractures Info: Not present    Additional Factors Info  Code Status, Allergies, Psychotropic,  Insulin  Sliding Scale Code Status Info: DNR-Limited Allergies Info: See discharge instructions Psychotropic Info: SEROQUEL , lexapro  Insulin  Sliding Scale Info: See discharge instructions       Current Medications (02/16/2024):  This is the current hospital active medication list Current Facility-Administered Medications  Medication Dose Route Frequency Provider Last Rate Last Admin   acetaminophen  (TYLENOL ) tablet 650 mg  650 mg Oral Q6H PRN Jerilynn Daphne SAILOR, NP   650 mg at 02/09/24 2154   Or   acetaminophen  (TYLENOL ) suppository 650 mg  650 mg Rectal Q6H PRN Lawrence, Brandi N, NP       alum & mag hydroxide-simeth (MAALOX/MYLANTA) 200-200-20 MG/5ML suspension 30 mL  30 mL Oral Q4H PRN Lawrence, Brandi N, NP       aspirin  EC tablet 81 mg  81 mg Oral Daily Lawrence, Brandi N, NP   81 mg at 02/16/24 0857   atorvastatin  (LIPITOR ) tablet 80 mg  80 mg Oral QHS Lawrence, Brandi N, NP   80 mg at 02/15/24 2234   bisacodyl  (DULCOLAX) suppository 10 mg  10 mg Rectal Daily PRN Jerilynn Daphne SAILOR, NP       camphor-menthol  VIKKI) lotion   Topical PRN Urbano Albright, MD       diphenhydrAMINE  (BENADRYL ) capsule 25 mg  25 mg Oral QHS PRN Engler, Morgan C, DO   25 mg at 02/12/24  2215   donepezil (ARICEPT) tablet 5 mg  5 mg Oral QHS Engler, Morgan C, DO       enoxaparin  (LOVENOX ) injection 40 mg  40 mg Subcutaneous Q24H Lawrence, Brandi N, NP   40 mg at 02/16/24 1100   escitalopram  (LEXAPRO ) tablet 10 mg  10 mg Oral Daily Jerilynn Daphne SAILOR, NP   10 mg at 02/16/24 9141   Gerhardt's butt cream   Topical PRN Engler, Morgan C, DO       guaiFENesin -dextromethorphan  (ROBITUSSIN DM) 100-10 MG/5ML syrup 5-10 mL  5-10 mL Oral Q6H PRN Jerilynn Daphne SAILOR, NP       insulin  aspart (novoLOG ) injection 0-5 Units  0-5 Units Subcutaneous QHS Engler, Morgan C, DO   2 Units at 02/07/24 2233   insulin  aspart (novoLOG ) injection 0-9 Units  0-9 Units Subcutaneous TID WC Engler, Morgan C, DO   1 Units at 02/16/24 1257    [START ON 02/17/2024] insulin  glargine-yfgn (SEMGLEE ) injection 25 Units  25 Units Subcutaneous Daily Emeline Search C, DO       mirtazapine (REMERON) tablet 7.5 mg  7.5 mg Oral QHS Engler, Morgan C, DO   7.5 mg at 02/15/24 2233   pantoprazole  (PROTONIX ) EC tablet 40 mg  40 mg Oral QHS Lawrence, Brandi N, NP   40 mg at 02/15/24 2234   polyethylene glycol (MIRALAX  / GLYCOLAX ) packet 17 g  17 g Oral Daily Engler, Morgan C, DO   17 g at 02/16/24 9142   prochlorperazine  (COMPAZINE ) tablet 5-10 mg  5-10 mg Oral Q6H PRN Jerilynn Daphne SAILOR, NP       Or   prochlorperazine  (COMPAZINE ) suppository 12.5 mg  12.5 mg Rectal Q6H PRN Jerilynn Daphne SAILOR, NP       Or   prochlorperazine  (COMPAZINE ) injection 5-10 mg  5-10 mg Intravenous Q6H PRN Jerilynn Daphne SAILOR, NP       senna-docusate (Senokot-S) tablet 1 tablet  1 tablet Oral BID Emeline Search C, DO   1 tablet at 02/16/24 0857   sodium phosphate  (FLEET) enema 1 enema  1 enema Rectal Once PRN Jerilynn Daphne SAILOR, NP       sorbitol  70 % solution 30 mL  30 mL Oral Daily PRN Engler, Morgan C, DO         Discharge Medications: Please see discharge summary for a list of discharge medications.  Relevant Imaging Results:  Relevant Lab Results:   Additional Information SSN [id number]  Krissi Willaims A Feliciana, LCSW

## 2024-02-16 NOTE — Progress Notes (Signed)
 Occupational Therapy Session Note  Patient Details  Name: John [name] MRN: [id number] Date of Birth: 1945/11/02  Today's Date: 02/16/2024 OT Individual Time: 0900-0930 OT Individual Time Calculation (min): 30 min    Short Term Goals: Week 2:  OT Short Term Goal 1 (Week 2): Pt will complete full-body bathing with Min A. OT Short Term Goal 2 (Week 2): Pt will complete 3/3 toileting tasks with Min A. OT Short Term Goal 3 (Week 2): Pt will complete ambulatory transfers with CGA + LRAD.  Skilled Therapeutic Interventions/Progress Updates:      Therapy Documentation Precautions:  Precautions Precautions: Fall Precaution Booklet Issued: No Recall of Precautions/Restrictions: Impaired Precaution/Restrictions Comments: L hemiparesis Restrictions Weight Bearing Restrictions Per Provider Order: No General: Pt supine in bed upon OT arrival eating breakfast, agreeable to OT session.  Pain: no pain reported  Other Treatments: Initially upon arrival, pt slightly aggitated on D/C date, reporting that he and his wife are not ready for it. OT educating pt on therapy process and progress made since first day in IPR. OT also educating on hired assistance if needed for wife in order to assist with pt care OT educating pt on goals set in IPR and how he has almost met goals. Pt seemingly pleased with information and smiling. OT reiterated that wife still has another family training session to complete tomorrow 11/7 and will be able to answer any further questions for her. OT also providing education on leisure activities and routines for pt at home in order to improve cognitive functioning.   Pt supine in bed with bed alarm activated, 2 bed rails up, call light within reach and 4Ps assessed.   Therapy/Group: Individual Therapy  Camie Hoe, OTD, OTR/L 02/16/2024, 9:35 AM

## 2024-02-16 NOTE — Progress Notes (Signed)
 Speech Language Pathology Daily Session Note  Patient Details  Name: John [name] MRN: [id number] Date of Birth: 09-16-1945  Today's Date: 02/16/2024 SLP Individual Time: 1445-1530 SLP Individual Time Calculation (min): 45 min  Short Term Goals: Week 2: SLP Short Term Goal 1 (Week 2): STGs = LTGs d/t ELOS  Skilled Therapeutic Interventions:   Pt greeted at bedside for tx targeting cognition. He was up in his TIS WC upon SLP arrival. SLP facilitated functional orientation review and he was within 1 day of current date. He was able to recall broad details re events of the day thus far w/ minA, but noted to transpose tasks and therapies.  SLP then facilitated recall task w/ 5 pics. He immediately recalled 5/5 items independently. After a 10 min delay and additional 15 min delay, he recalled 3/5 items. He benefited from [facility] for attention to detail, self monitoring/correction, and problem solving during check register task and presented w/ slightly improved reasoning compared to prev tx sessions. Tangential speech noted after structured task and he required minA for clarification of information d/t contradicting statements from earlier in the session/his tangent. At the end of tx tasks, he was left in his [facility] w/ the call light within reach and chair alarm in place. Recommend cont ST per POC.   Pain  None reported  Therapy/Group: Individual Therapy  [name] 02/16/2024, 3:47 PM

## 2024-02-17 DIAGNOSIS — I639 Cerebral infarction, unspecified: Secondary | ICD-10-CM | POA: Diagnosis not present

## 2024-02-17 LAB — GLUCOSE, CAPILLARY
Glucose-Capillary: 80 mg/dL (ref 70–99)
Glucose-Capillary: 178 mg/dL — ABNORMAL HIGH (ref 70–99)
Glucose-Capillary: 244 mg/dL — ABNORMAL HIGH (ref 70–99)
Glucose-Capillary: 191 mg/dL — ABNORMAL HIGH (ref 70–99)

## 2024-02-17 MED ORDER — LIDOCAINE 5 % EX PTCH
2.0000 | MEDICATED_PATCH | CUTANEOUS | Status: DC
  Administered 2024-02-17 – 2024-02-19 (×3): 2 via TRANSDERMAL
  Filled 2024-02-17 (×3): qty 2

## 2024-02-17 MED ORDER — POLYETHYLENE GLYCOL 3350 17 G PO PACK
17.0000 g | PACK | Freq: Every day | ORAL | Status: DC | PRN
Start: 2024-02-17 — End: 2024-02-20
  Administered 2024-02-19: 17 g via ORAL
  Filled 2024-02-17: qty 1

## 2024-02-17 MED ORDER — SENNOSIDES-DOCUSATE SODIUM 8.6-50 MG PO TABS
1.0000 | ORAL_TABLET | Freq: Every day | ORAL | Status: DC
  Administered 2024-02-18 – 2024-02-19 (×2): 1 via ORAL
  Filled 2024-02-17 (×2): qty 1

## 2024-02-17 NOTE — Progress Notes (Signed)
 Speech Language Pathology Daily Session Note  Patient Details  Name: John [name] MRN: [id number] Date of Birth: [birth year]  Today's Date: 02/17/2024 SLP Individual Time: 1350-1445 SLP Individual Time Calculation (min): 55 min  Short Term Goals: Week 2: SLP Short Term Goal 1 (Week 2): STGs = LTGs d/t ELOS  Skilled Therapeutic Interventions: SLP conducted skilled therapy session targeting cognitive goals. Patient in room with wife present. Patient participatory throughout all tasks and exhibits significantly decreased instances of argumentativeness with family present compared to prior sessions. During visual problem identification and solution generation task, patient identified various problems and provided solutions with supervisionA. He then completed mildly complex pattern/picture replication with only min cues for accuracy and problem solving. He demonstrated reduced initiation, with wife asking for patient to don reading glasses vs. Patient asking for them once difficulty was realized. In final minutes of session, patient completed mildly complex decoding task where he required max fading to min cues to complete accurately. Patient was left in room with call bell in reach and alarm set. SLP will continue to target goals per plan of care.        Pain Pain Assessment Pain Scale: 0-10 Pain Score: 0-No pain Pain Location: Back Pain Intervention(s): Medication (See eMAR)  Therapy/Group: Individual Therapy  John [name], M.A., CCC-SLP  John [name] A John [name] 02/17/2024, 2:47 PM

## 2024-02-17 NOTE — Progress Notes (Addendum)
 Patient ID: John [name], male   DOB: 21-Mar-1946, 78 y.o.   MRN: [id number]   [phone] SW left message for pt [name]/Admissions with [facility] accepting bed offer, and inquired if there will be an issue with him approaching his co-pay days.   SW received call from pt wife, and SW shared will confirm when pt can leave per attending.   *SW received message from [place] reporting no issues with approaching co-pay days but will need insurance auth submitted.   1017-SW  spoke with [place]/[employer] (p:[phone]/f:[phone]/ref#[id number]) reporting clinicals will need to be sent.   SW faxed clinicals.   1051- SW left message for [name]/Admissions with [facility] to inform insurance auth submitted.   1052- SW called pt wife [name] to inform on above.   *SW received called from [employer] confirming pt approved for SNF.   1614- [facility] ambulance scheduled for pick up on Monday at 11am for transport to [facility].   1620- SW left message for [name]/Admissions with [facility] informing on above about approval, and transportation. SW requested confirmation for Monday admission, rm# and nurse report.   1623- SW returned call to Jessie/[employer] (p:[phone]/f:[phone]/ref#[id number]) about approval and auth information. Auth is effective for 11/7-11/11; CM- Fee and her email is: [email or web address]  if an extension is needed, ath request needs to be faxed to- # [phone]; ref #[id number]; auht ID has not been generated yet.   67- SW spoke with pt wife to inform on above.   Confirmed admission for Monday at [facility] Rm#- will confirm on Monday  Nurse Report [phone]   [name], MSW, LCSW Office: [phone] Cell: [phone] Fax: [phone]

## 2024-02-17 NOTE — Plan of Care (Signed)
 Problem: Consults Goal: RH GENERAL PATIENT EDUCATION Description: See Patient Education module for education specifics. Outcome: Progressing   Problem: RH BOWEL ELIMINATION Goal: RH STG MANAGE BOWEL WITH ASSISTANCE Description: STG Manage Bowel with supervision Assistance. Outcome: Progressing   Problem: RH BLADDER ELIMINATION Goal: RH STG MANAGE BLADDER WITH ASSISTANCE Description: STG Manage Bladder With supervision  Assistance Outcome: Progressing   Problem: RH SKIN INTEGRITY Goal: RH STG SKIN FREE OF INFECTION/BREAKDOWN Description: Manage skin free of infection with supervision assistance Outcome: Progressing   Problem: RH SAFETY Goal: RH STG ADHERE TO SAFETY PRECAUTIONS W/ASSISTANCE/DEVICE Description: STG Adhere to Safety Precautions With supervision  Assistance/Device. Outcome: Progressing   Problem: RH PAIN MANAGEMENT Goal: RH STG PAIN MANAGED AT OR BELOW PT'S PAIN GOAL Description: <4 w/ prns Outcome: Progressing   Problem: RH KNOWLEDGE DEFICIT GENERAL Goal: RH STG INCREASE KNOWLEDGE OF SELF CARE AFTER HOSPITALIZATION Description: Manage increase  knowledge of self care after hospitalization with supervision assistance from wife using educational materials provided Outcome: Progressing

## 2024-02-17 NOTE — Progress Notes (Signed)
 Physical Therapy Session Note  Patient Details  Name: John [name] MRN: [id number] Date of Birth: 01/24/1946  Today's Date: 02/17/2024 PT Individual Time: 8694-8654 PT Individual Time Calculation (min): 40 min   Short Term Goals: Week 2:  PT Short Term Goal 1 (Week 2): STG = LTG due to ELOS  Skilled Therapeutic Interventions/Progress Updates:    Pt presents in room in [facility] with pt wife present for family education. Pt reporting pain in back, premedicated. Session focused on therapeutic activities to promote upright tolreance, transfer training as well as NMR for dynamic standing balance and BUE/BLE muscle fiber recruitment. Pt completes sit to stand from [facility] with min assist to RW. Pt ambulates from room to main gym with RW with CGA/min assist. Pt completes standing therex with BUE support on RW as NMR to promote BLE strengthening, activity tolerance, and muscle fiber recruitment needed for functional transfers including: -sit to stands x5 -Marching x20 alternating BLE -Heel raise x10 -Mini squats x10 -Standing glute set x10 -Seated rows red band x10 Pt ambulates up/down 4 steps with CGA assist with BHRs with cues for placing full foot on step. Pt provided with seated rest breaks between all gait trials and exercises to promote energy conservation and quality with tasks. Pt wife reporting having injured her rib, therapist recommends not getting hands on during session however education provided throughout. Therapist provided education on incontinence and timed toileting to decrease bowel accidents. Pt returns to room ambulating with RW with CGA/min assist and remains seated in [facility] with all needs within reach, cal light in place and chair alarm donned and activated at end of session.   Therapy Documentation Precautions:  Precautions Precautions: Fall Precaution Booklet Issued: No Recall of Precautions/Restrictions: Impaired Precaution/Restrictions Comments: L  hemiparesis Restrictions Weight Bearing Restrictions Per Provider Order: No   Therapy/Group: Individual Therapy  Reche Ohara PT, DPT 02/17/2024, 4:34 PM

## 2024-02-17 NOTE — Progress Notes (Signed)
 Occupational Therapy Session Note  Patient Details  Name: John [name] MRN: [id number] Date of Birth: 1945-04-29  Today's Date: 02/17/2024 OT Individual Time: 1105-1200 OT Individual Time Calculation (min): 55 min    Short Term Goals: Week 2:  OT Short Term Goal 1 (Week 2): Pt will complete full-body bathing with Min A. OT Short Term Goal 2 (Week 2): Pt will complete 3/3 toileting tasks with Min A. OT Short Term Goal 3 (Week 2): Pt will complete ambulatory transfers with CGA + LRAD.  Skilled Therapeutic Interventions/Progress Updates:   Pt greeted sitting in TIS WC, reports of back pain, RN administering medications during session. Pt dependent for WC transport to main therapy gym for energy conservation.  Pt instructed in the activities below for standing tolerance/balance and decreasing fall risk, details below: 3x1 min alternating toe taps, CGA provided with intermediate posterior LOB (Min A to correct) 2 x 8 reps of step ups onto 6 in steps, alternating leading leg between BLE, Min-Mod A provided dependent on which leg was leading. BUE support on rails.  2 mins of static stance with no UE, BUE engaged in UE ergometer, CGA provided with Min A required towards end of activity due to L lateral lean with fatigue.  Pt ends session by ambulating back to room from main gym with CGA + RW, consistent cuing for safe RW proximity. Pt remained sitting in standard WC, posey belt donned, and door open.   Therapy Documentation Precautions:  Precautions Precautions: Fall Precaution Booklet Issued: No Recall of Precautions/Restrictions: Impaired Precaution/Restrictions Comments: L hemiparesis Restrictions Weight Bearing Restrictions Per Provider Order: No   Therapy/Group: Individual Therapy  [name], OTR/L, MSOT  02/17/2024, 11:23 AM

## 2024-02-17 NOTE — Discharge Summary (Signed)
 Physician Discharge Summary  Patient ID: John [name] MRN: [id number] DOB/AGE: [birth year] 78 y.o.  Admit date: 02/03/2024 Discharge date: 02/20/2024  Discharge Diagnoses:  Principal Problem:   CVA (cerebral vascular accident) [facility]) Active Problems:   Depression, major, single episode, complete remission   GERD (gastroesophageal reflux disease)   BPH (benign prostatic hyperplasia)   Insomnia    Anemia   Encephalopathy   Bladder outlet obstruction   HFrEF (heart failure with reduced ejection fraction) (HCC)   Constipation   Hypomagnesemia   T2DM   Discharged Condition: stable  Significant Diagnostic Studies: N/A   Labs:  Basic Metabolic Panel: Recent Labs  Lab 02/16/24 0505 02/18/24 0534 02/20/24 0524  NA 142 144 140  K 3.6 3.6 3.8  CL 106 106 104  CO2 25 23 25   GLUCOSE 95 85 138*  BUN 25* 16 17  CREATININE 1.19 0.97 0.97  CALCIUM  8.9 8.7* 8.8*  MG  --  1.3* 1.4*    CBC:    Latest Ref Rng & Units 02/20/2024    5:24 AM 02/16/2024    5:05 AM 02/13/2024    4:42 AM  CBC  WBC 4.0 - 10.5 K/uL 6.9  8.8  8.7   Hemoglobin 13.0 - 17.0 g/dL 89.2  88.7  89.0   Hematocrit 39.0 - 52.0 % 32.2  34.5  33.3   Platelets 150 - 400 K/uL 217  250  280      Magnesium :   Latest Reference Range & Units 01/29/24 02:56 01/30/24 02:50 01/31/24 07:59 02/01/24 02:32 02/18/24 05:34 02/20/24 05:24  Magnesium  1.7 - 2.4 mg/dL 1.4 (L) 2.0 1.5 (L) 1.6 (L) 1.3 (L) 1.4 (L)  (L): Data is abnormally low   CBG: Recent Labs  Lab 02/19/24 0554 02/19/24 1137 02/19/24 1652 02/19/24 2101 02/20/24 0559  GLUCAP 110* 310* 169* 170* 124*    Recent Vitals:    02/20/2024    4:50 AM 02/19/2024    7:40 PM 02/19/2024    3:22 PM  Vitals with BMI  Systolic 147 134 875  Diastolic 59 64 66  Pulse 60 65 80     Brief HPI:   John [name] is a 78 y.o. male with PMHx of CHF with reduced EF, CVA, AVB status post pacemaker, DM2, HTN, HLD, DVT not on anticoagulation, chronic Foley due to  bladder outlet obstruction, and recent hospitalization due to acute ischemic right sided pontine infarct. He was originally admitted 01/16/2024 with left-sided weakness and found to have  ESBL E. Coli UTI treated with IV meropenem that was to transition to fosfomycin.  Neurology was consulted for further evaluation and MRI brain done on 10/08 which was positive for a right pontine infarct with a possible punctate petechial hemorrhage. A CTA of the head and neck showed severe right posterior cerebral artery stenosis. Neurology recommended DAPT X 3 weeks for lacunar infarct followed by ASA alone.  He was admitted to CIR on 01/24/2024 and was making gains but on 10/21, he was developed hypotension with unresponsiveness with SBP in 50's. Full work up initiated and CT head negative for new stroke, EEG without seizure activity but he was found to have severe sepsis due to RLL aspiration PNA. He was started on meropenum and transitioned to Augmentin  with EOT 10/24. BC negative and patient declined MBS or swallow evaluation. He was also found to have candida in urine and foley changed out per ID input. Palliative care consulted for input and patient elected on DNR. Therapy reconsulted and patient  was noted to be requiring max assist for functional mobility and ADLs. He was cleared  to resume CIR program.    Inpatient Rehabilitation Course: John [name] was admitted to rehab 02/03/2024 for inpatient therapies to consist of PT, ST and OT at least three hours five days a week. Past admission physiatrist, therapy team and rehab RN have worked together to provide customized collaborative inpatient rehab. He has completed three week course of DAPT and continues on ASA. He is tolerating this without SE with serial CBC showing H/H and platelets to be stable. His bloo pressures have been monitored on TID basis and he was maintained on home dose losartan .  He did develop orthostatic hypotension requiring binder and teds.  Follow up check of BMET showed pre-renal azotemia which has resolved with IV fluids for hydration.  Losartan  was discontinued with stabilization of BP which are reasonably controlled at this point.  He was found to have low Mg level which improved briefly but was found to have recurrent drop to to 1.3 on 11/8 IV supplement ordered but patient refused this.  Repeat labs today shows minimal improvement to 1.4 therefore Mag ox added for supplement with recommendations to recheck level in 2-3 days and adjust dose as needed. His diabetes has been monitored with ac/hs CBG checks and SSI was used for tighter control.  Metformin  was discontinued due to AKI and insulin  glargine has been titrated up and 26 units. Senna S added to help manage constipation and he has needed additional intervention on prn basis.  Foley care has been ongoing however he developed itching with chlorhexidine  which were changed to alternative cleanser.  His participation in therapy has been limited by poor motivation, apathy and moderate cognitive deficits.  Seroquel  was weaned off and low-dose mirtazapine added to help with mood and sleep.  Aricept was started on 11/06 for memory decline and he has been tolerating this without side effects. Dr. Corina Neuro Psychologist consulted for coping and adjustment issues in the setting of extended hospital stay. No new medications or follow up advised.  He has made limited gains during his rehab stay and family has elected on SNF for follow-up therapies.  Bed is available at [facility] burn and he was discharged to this facility on 02/20/2024.   Rehab course: During patient's stay in rehab weekly team conferences were held to monitor patient's progress, set goals and discuss barriers to discharge. At admission, patient required  mod assist with mobility and max assist with ADLs. He exhibited mod cognitive linguistic deficits with cognitive changes affecting expressive/receptive languages. He adamantly  denied any swallow difficulty and safe swallow strategies added for safety with meals. He  has had improvement in activity tolerance, balance, postural control as well as ability to compensate for deficits. He has had improvement in functional use LUE  and LLE as well as improvement in awareness. He requires CGA for bathing and mod assist for LB dressing. He is able to ambulate 30 feet with CGA and use of RW.  He required encouragement to participate in ST tasks and continues to lack insight into physical and cognitive deficits and presents with moderate deficits in areas of attention, initiation, short-term memory and problem-solving.    Discharge disposition: 03-Skilled Nursing Facility  Diet:  Heart Healthy/Carb modified Regular   Special Instructions: Monitor blood sugars AC/HS basis and use SSI per protocol  Foley changed out on 10/18 and recommend change in next week. Foley care BID.   3.  Needs to  be upright for meals. Intermittent supervision at meals for safety. Take small bites/sips and stay up for 30-60 minutes after meals.  4.  Repeat Magnesium  level in 2-3 days and adjust supplement as needed.    Discharge Instructions     Ambulatory referral to Neurology   Complete by: As directed    An appointment is requested in approximately: 4 weeks   Ambulatory referral to Physical Medicine Rehab   Complete by: As directed       Allergies as of 02/20/2024       Reactions   Ramipril Anaphylaxis   Chlorhexidine     Adhesive [tape] Rash   Other Diarrhea   Severe intolerance to Chemotherapy in the past.   Sertraline Other (See Comments)   Extreme headaches        Medication List     STOP taking these medications    acetaminophen  325 MG tablet Commonly known as: TYLENOL    bisacodyl  10 MG suppository Commonly known as: DULCOLAX   clopidogrel  75 MG tablet Commonly known as: PLAVIX    clotrimazole -betamethasone  cream Commonly known as: LOTRISONE    cyanocobalamin  1000  MCG tablet Commonly known as: VITAMIN B12   FeroSul 325 (65 Fe) MG tablet Generic drug: ferrous sulfate    finasteride  5 MG tablet Commonly known as: PROSCAR    hyoscyamine  0.125 MG SL tablet Commonly known as: LEVSIN  SL   insulin  glargine 100 UNIT/ML injection Commonly known as: LANTUS    losartan  25 MG tablet Commonly known as: COZAAR    magnesium  hydroxide 400 MG/5ML suspension Commonly known as: MILK OF MAGNESIA   metFORMIN  500 MG tablet Commonly known as: GLUCOPHAGE    multivitamin tablet   ondansetron  4 MG disintegrating tablet Commonly known as: ZOFRAN -ODT   polyethylene glycol 17 g packet Commonly known as: MIRALAX  / GLYCOLAX  Replaced by: polyethylene glycol powder 17 GM/SCOOP powder   PRESCRIPTION MEDICATION   QUEtiapine  25 MG tablet Commonly known as: SEROQUEL    vitamin C 1000 MG tablet       TAKE these medications    aspirin  EC 81 MG tablet Take 1 tablet (81 mg total) by mouth daily. Swallow whole.   atorvastatin  80 MG tablet Commonly known as: LIPITOR  Take 1 tablet (80 mg total) by mouth at bedtime.   camphor-menthol  lotion Commonly known as: SARNA Apply topically as needed for itching.   donepezil 5 MG tablet Commonly known as: ARICEPT Take 1 tablet (5 mg total) by mouth at bedtime.   escitalopram  10 MG tablet Commonly known as: Lexapro  Take 1 tablet (10 mg total) by mouth daily.   insulin  aspart 100 UNIT/ML injection Commonly known as: novoLOG  Inject 3 Units into the skin 3 (three) times daily with meals. What changed: Another medication with the same name was removed. Continue taking this medication, and follow the directions you see here.   insulin  glargine-yfgn 100 UNIT/ML injection Commonly known as: SEMGLEE  Inject 0.26 mLs (26 Units total) into the skin daily. Start taking on: February 21, 2024   lidocaine  5 % Commonly known as: LIDODERM  Place 2 patches onto the skin daily. Remove & Discard patch within 12 hours or as directed  by MD   magnesium  oxide 400 (240 Mg) MG tablet Commonly known as: MAG-OX Take 1 tablet (400 mg total) by mouth daily.   mirtazapine 7.5 MG tablet Commonly known as: REMERON Take 1 tablet (7.5 mg total) by mouth at bedtime.   pantoprazole  40 MG tablet Commonly known as: PROTONIX  Take 1 tablet (40 mg total) by mouth at bedtime.  polyethylene glycol powder 17 GM/SCOOP powder Commonly known as: GLYCOLAX /MIRALAX  Take 17 g by mouth daily as needed. Dissolve 1 capful (17g) in 4-8 ounces of liquid and take by mouth daily. Replaces: polyethylene glycol 17 g packet   senna-docusate 8.6-50 MG tablet Commonly known as: Senokot-S Take 1 tablet by mouth at bedtime.        Contact information for after-discharge care     Destination     [facility] .   Service: Skilled Nursing Contact information: [address] [place]  [address] [phone]                     Signed: Sharlet GORMAN Schmitz 02/20/2024, 10:52 AM

## 2024-02-17 NOTE — Progress Notes (Signed)
 Physical Therapy Session Note  Patient Details  Name: John [name] MRN: [id number] Date of Birth: [birth year]  Today's Date: 02/17/2024 PT Individual Time: 9052-8954 PT Individual Time Calculation (min): 58 min   Short Term Goals: Week 2:  PT Short Term Goal 1 (Week 2): STG = LTG due to ELOS  Skilled Therapeutic Interventions/Progress Updates: Pt presents semi-reclined in bed and states leaving today.  Pt informed that therapy is still planned for today until all arrangements made.  Pt agreeable to participate but states need to go to BR since he feels he has had a BM.  Pt transfers sup to sit w/ mod A and then sit to stand w/ mod a.  Pt amb into BR w/ RW and min A/ cues for LLE advancement.  Pt required new TED hose and pants 2/2 BM on when doffing brief.  Pt stood x 5' for pericare and pulling up brief and pants.  Pt amb to TIS w/ min A.  Pt wheeled to main gym for time conservation.  Pt amb x 2 trials of 37' w/ cues for posture and maintaining position w/in RW.  Pt negotiated cone obstacle course w/ min A and cues for keeping feet w/in RW confines w/ turns.  Pt amb back to room w/ min A and RW, cues for posture including glut activation and head up.  Pt remained sitting in TIS w/ chair alarm on and all needs in reach, reclined back.     Therapy Documentation Precautions:  Precautions Precautions: Fall Precaution Booklet Issued: No Recall of Precautions/Restrictions: Impaired Precaution/Restrictions Comments: L hemiparesis Restrictions Weight Bearing Restrictions Per Provider Order: No General:   Vital Signs:   Pain:0/10 Pain Assessment Pain Scale: 0-10 Pain Score: 6  Pain Location: Back Pain Intervention(s): Medication (See eMAR)    Therapy/Group: Individual Therapy  John [name] P John [name] 02/17/2024, 11:46 AM

## 2024-02-17 NOTE — Progress Notes (Signed)
 PROGRESS NOTE   Subjective/Complaints: No evens overnight.  No acute complaints.  Seen in therapy gym, discussed planning for discharge to SNF on Monday, patient agreeable with this plan. Vitals stable  BM 11/6 x2  ROS: Denies fevers, chills, N/V, abdominal pain, nausea, constipation, diarrhea, SOB, cough, chest pain, new weakness or paraesthesias.    Objective:   No results found. Recent Labs    02/16/24 0505  WBC 8.8  HGB 11.2*  HCT 34.5*  PLT 250    Recent Labs    02/16/24 0505  NA 142  K 3.6  CL 106  CO2 25  GLUCOSE 95  BUN 25*  CREATININE 1.19  CALCIUM  8.9    Intake/Output Summary (Last 24 hours) at 02/17/2024 0859 Last data filed at 02/17/2024 9487 Gross per 24 hour  Intake 480 ml  Output 1200 ml  Net -720 ml        Physical Exam: Vital Signs Blood pressure (!) 122/59, pulse 60, temperature 97.8 F (36.6 C), temperature source Oral, resp. rate 18, height 6' (1.829 m), weight 76.9 kg, SpO2 99%.  Constitutional: No apparent distress. Sitting up in [facility] in therapy gym. HENT: Atraumatic, normocephalic. Cardiovascular: RRR, no murmurs/rub/gallops. No Edema.   Respiratory: Clear to auscultation bilaterally.  No rales, rhonchi, or wheezing. On RA.  Abdomen: + bowel sounds, normoactive. No distention or tenderness.  GU: Not examined. +Foley, draining clear urine.  Skin: C/D/I. No apparent lesions or rashes noted.    MSK:      No apparent deformity.       Neurologic exam:  AAO to person, place; time with cues. + Mild to moderate cognitive deficits--waxes and wanes, consistently more lethargic/confused in the morning   -Able to perform basic math, to complex reasoning, abstraction; remembers 1/3 items at 5 minutes  Insight: Fair insight into current condition.  Mood: Pleasant affect, appropriate mood.  Sensation: To light touch intact in BL UEs and LEs  Reflexes: Negative Hoffman's and babinski  signs bilaterally.  CN: 2-12 grossly intact.  Coordination: Mild left upper and lower extremity ataxia unchanged Spasticity: MAS 0 in all extremities.   Strength; antigravity against resistance bilateral upper and lower extremities, 4-5- /5 in left upper and lower extremities, 5/5 in right upper and lower extremities   Physical exam unchanged from the above on reexamination 02/17/24    Assessment/Plan: 1. Functional deficits which require 3+ hours per day of interdisciplinary therapy in a comprehensive inpatient rehab setting. Physiatrist is providing close team supervision and 24 hour management of active medical problems listed below. Physiatrist and rehab team continue to assess barriers to discharge/monitor patient progress toward functional and medical goals  Care Tool:  Bathing    Body parts bathed by patient: Right arm, Chest, Left arm, Abdomen, Face, Right upper leg, Left upper leg, Right lower leg, Left lower leg, Front perineal area, Buttocks   Body parts bathed by helper: Front perineal area, Buttocks     Bathing assist Assist Level: Contact Guard/Touching assist     Upper Body Dressing/Undressing Upper body dressing   What is the patient wearing?: Pull over shirt    Upper body assist Assist Level: Set up assist  Lower Body Dressing/Undressing Lower body dressing      What is the patient wearing?: Pants, Incontinence brief     Lower body assist Assist for lower body dressing: Moderate Assistance - Patient 50 - 74%     Toileting Toileting    Toileting assist Assist for toileting: 2 Helpers     Transfers Chair/bed transfer  Transfers assist     Chair/bed transfer assist level: Minimal Assistance - Patient > 75%     Locomotion Ambulation   Ambulation assist      Assist level: 2 helpers Assistive device: Walker-rolling Max distance: 112'   Walk 10 feet activity   Assist     Assist level: 2 helpers Assistive device: Walker-rolling    Walk 50 feet activity   Assist    Assist level: 2 helpers Assistive device: Walker-rolling    Walk 150 feet activity   Assist Walk 150 feet activity did not occur: Safety/medical concerns (Max distance 112')         Walk 10 feet on uneven surface  activity   Assist Walk 10 feet on uneven surfaces activity did not occur: Safety/medical concerns         Wheelchair     Assist Is the patient using a wheelchair?: Yes Type of Wheelchair: Manual    Wheelchair assist level: Dependent - Patient 0%      Wheelchair 50 feet with 2 turns activity    Assist        Assist Level: Dependent - Patient 0%   Wheelchair 150 feet activity     Assist      Assist Level: Dependent - Patient 0%   Blood pressure (!) 122/59, pulse 60, temperature 97.8 F (36.6 C), temperature source Oral, resp. rate 18, height 6' (1.829 m), weight 76.9 kg, SpO2 99%.  1. Functional deficits secondary to acute pontine infarct             -patient may  shower             -ELOS/Goals: 9-12 days Min A to supervision -discharge to SNF on 11-10              - stable to continue CIR  - 10/27: Will recheck U/A/culture, does not appear as oriented this AM as prior notes, Discussed with therapy- not felt to be off his usual baseline as he waxes and waves, nursing reports oriented x3 later in day, continue to monitor    - 10/28: Per therapies at cognitive baseline. Min A bed mobility - up to 300 ft with RW with Min A. Self-limiting and some cognitive issues. Setup UBD, L lateral lean with sitting and standing. Poor motivation /apathy huge. Some L inattention. Moderate cognitive deficits,  effected by self-limiting and poor participation. Very defiant behaviorally.   - Behavior plan placed by team  - Therapy teams planning therapy schedule in the late morning and early afternoon due to consistent a.m. lethargy and confusion; wife states this is consistent with how he is at home, generally does  not wake up before lunch.   - 11/4: Fluctuant, Max A to supervision for bed mobility depending on mood and motivation. Walking less up to 60 ft, increasing L lateral lean noted. Behaviorally less engaged the longer he is here. Flustuates in OT depending on time of day.   SNF suggested, although appears patient does not have many days left.  Min A UB/Mod A LB goals. Has no buy-in with SLP. Family training tomorrow.   -11/6:  reportedly wife pursuing placement; will d/w team in AM  2.  Antithrombotics: -DVT/anticoagulation:  Pharmaceutical: Lovenox              -antiplatelet therapy: Aspirin  and  Plavix  until 10/28 then aspirin  alone--finished Plavix    3. Pain Management: Oxycodone  and Tylenol  as needed--well-controlled   4. Mood/Behavior/Sleep: LCSW to follow for evaluation and support when available.              -antipsychotic agents: Lexapro  10 mg daily - Delirium : Seroquel  25 mg nightly--tolerating well  --waxes/wanes - 10/28: Move seroquel  to PRN, add mirtazepine 15 mg at bedtime for sleep and mood 10/29: Poor sleep since removal of seroquel ; resume at 25 mg at bedtime 10/30: Overly sedated this a.m.  Reduce mirtazapine to 7.5 mg nightly.  Therapies adjusting for afternoon schedules 10-31: Again, increasingly lethargic into the later afternoon.  DC Seroquel , continue mirtazapine 7.5 mg nightly, change as needed to Benadryl  25 mg given patient reports of itching sensation as he falls asleep without objective rashes. 11/1 reports slept better yesterday, doesn't appear that he got as needed Benadryl  11-4: Patient reports better sleep, monitor--doing well with current medications.  Behaviorally, engaging less with therapies, feels he is mostly self-limited and has little therapy buy-in at this point.  Mood, interaction is appropriate on my exam.   - Would consider increase in Lexapro , has history of severe headaches with sertraline so would not change agent.  Defer to outpatient since we just  started mirtazapine 11/6: on chart review, Hx wellbutrin  - will d/w patient today - he endorses good mood, no SI/HI, simply tired of being in the hospital which is reasonable   5. Neuropsych/cognition: This patient may be intermittently capable of making decisions on their own behalf.    - 11-4: Consistently better in the afternoons in the late morning; consistent with reports from his wife.   - 11/6: Start aricept 5 mg at bedtime for memory decline  6. Skin/Wound Care: Routine pressure relief measures             -Stage 2 Sacral wound--wound care orders placed--Gerhardt's butt cream +Zinc  PO   7. Fluids/Electrolytes/Nutrition: Monitor intake and output.  CBC/CMET in a.m.             - SLP consult             -GERD: Continue PPI   -10-25: Albumin 2.4.  Encourage p.o. intakes.  - BMP ordered 10-29 due to orthostasis, possible hypovolemia--a.m. labs stable.  10/30: Cr increase to 1.1; give 1L IVF today; BMP in AM   10-31: BMP pending, encouraging p.o. fluids  11/2 rechecked BMP yesterday- Cr slightly down to 1.08, encourage oral fluids  11-3: Creatinine 0.8.  Resolved, monitor 11/6: cr back up, DC metformin , encourage PO--give 1 day off medication, repeat on 11-8; may need IV fluid repletion  8.  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  Atorvastatin  80 mg daily.   9.  Acute metabolic encephalopathy: Secondary to aspiration pneumonia and hypotension.  Mentation improved continue to monitor.   10. Hypomagnesia: Mg 1.4<2.0<1.5< now 1.6 monitor and replace.    Repeat 11-8 11.  Bladder outlet obstruction: Chronic foley catheter last changed 10/18; 10/31             -continue CHG and foley catheter care.  - f/u with urology outpatient.    12.  HTN: resumed home Losartan  25 mg daily, monitor BP with increased activity for hypotension    -  10-26: BP slightly elevated this a.m., back down this afternoon.  Monitor    -10/27 BP controlled, overall,  continue current regimen   10-28: Orthostatic hypotension with therapies, severe.  Hold losartan  for tomorrow AM.  Encourage p.o. fluids today.  - 10/29: Hypertensive overnight but orthostatic with therapies consistnetly 10/30:  give 1L IVF today, teds, binder   10-31: BP slightly improved, remains low out of bed with teds, patient asymptomatic.  Encouraging p.o. fluids, may need additional bolus   BP controlled overall, continue current regimen      02/17/2024    5:02 AM 02/16/2024    7:23 PM 02/16/2024    4:06 PM  Vitals with BMI  Systolic 122 130 864  Diastolic 59 66 90  Pulse 60 77 76     13.  T2DM: A1c 8.5% on 10/7  -monitor cbgs ac/hs with Novolog  SSI and Lantus  15 units  -10-26: Blood sugars consistently elevated, increase Semglee  to 20 units daily -10/27 increase semglee  to 23 units - 10/28 appears semglee  was Dced?? Add back at 23 U -10-29: Low in a.m., apparent under coverage for meals.  Resume home metformin  500 mg twice daily.  May need more Premeal added. 10/30: BG better controlled with current regimen, monitor 11/4: BG elevated recently; increase Semglee  to 25 units 11-5: PM low of 74, monitor for recurrence today. 11/6: AKI; DC metformin , continue semglee  25 11-7: Blood sugars variable, higher in the afternoons and lower in the morning, but overall well-controlled. Recent Labs    02/16/24 1607 02/16/24 2106 02/17/24 0625  GLUCAP 207* 151* 80     14.CAD/HLD: Atorvastatin    15.  Severe sepsis due to aspiration RLL PNA: Initially treated with IV meropenem now transition to p.o. Augmentin  ends 10/24.  ID consulted, Dr. Fleeta Rothman recommended exchanging Foley due to fungemia.             - Aspiration precautions             - Palliative care consult: Patient remains DNR limited  - 10-31: Foley again exchanged, culture positive for less than 70,000 colonies of yeast, Foley exchange should have been sufficient.  Monitor.   16. Cholelithiasis: Maybe chronic, no current  GI symptoms. Continue to monitor.    17.  Heart block: S/p PPM    - Heart rate well-controlled, monitor    02/17/2024    5:02 AM 02/16/2024    7:23 PM 02/16/2024    4:06 PM  Vitals with BMI  Systolic 122 130 864  Diastolic 59 66 90  Pulse 60 77 76    18.  Constipation/neurogenic bowel.  Last bowel movement 10-20.  Wife endorses after his last stroke, he was incontinent, improved after leaving hospital.  - Start Senokot S1 tab twice daily   - Miralax  PRN--10-26 add to daily 11-3: Bowel movement, continent.  Seems to be slowing down. 11-6: X 2 BM.  Reduce Senokot-S to 1 tablet nightly, change MiraLAX  to daily as needed  19. Itching -Reports after he was given chlorhexadine or similar type product?, discussed nursing, will not use this today.  Chlorhexidine  was discontinued -Benedryl prn, sarna prn--used twice on 11-2.  No obvious rash on exam. -Resolved 11-5 LOS: 14 days A FACE TO FACE EVALUATION WAS PERFORMED  John [name] 02/17/2024, 8:59 AM

## 2024-02-18 DIAGNOSIS — I639 Cerebral infarction, unspecified: Secondary | ICD-10-CM | POA: Diagnosis not present

## 2024-02-18 LAB — GLUCOSE, CAPILLARY
Glucose-Capillary: 80 mg/dL (ref 70–99)
Glucose-Capillary: 77 mg/dL (ref 70–99)
Glucose-Capillary: 172 mg/dL — ABNORMAL HIGH (ref 70–99)
Glucose-Capillary: 285 mg/dL — ABNORMAL HIGH (ref 70–99)

## 2024-02-18 LAB — BASIC METABOLIC PANEL WITH GFR
Anion gap: 15 (ref 5–15)
BUN: 16 mg/dL (ref 8–23)
CO2: 23 mmol/L (ref 22–32)
Calcium: 8.7 mg/dL — ABNORMAL LOW (ref 8.9–10.3)
Chloride: 106 mmol/L (ref 98–111)
Creatinine, Ser: 0.97 mg/dL (ref 0.61–1.24)
GFR, Estimated: >60 mL/min (ref >60)
Glucose, Bld: 85 mg/dL (ref 70–99)
Potassium: 3.6 mmol/L (ref 3.5–5.1)
Sodium: 144 mmol/L (ref 135–145)

## 2024-02-18 LAB — MAGNESIUM: Magnesium: 1.3 mg/dL — ABNORMAL LOW (ref 1.7–2.4)

## 2024-02-18 MED ORDER — MAGNESIUM SULFATE 2 GM/50ML IV SOLN
2.0000 g | Freq: Once | INTRAVENOUS | Status: DC
  Filled 2024-02-18: qty 50

## 2024-02-18 NOTE — Plan of Care (Signed)
 Problem: Consults Goal: RH GENERAL PATIENT EDUCATION Description: See Patient Education module for education specifics. Outcome: Progressing   Problem: RH BOWEL ELIMINATION Goal: RH STG MANAGE BOWEL WITH ASSISTANCE Description: STG Manage Bowel with supervision Assistance. Outcome: Progressing   Problem: RH BLADDER ELIMINATION Goal: RH STG MANAGE BLADDER WITH ASSISTANCE Description: STG Manage Bladder With supervision  Assistance Outcome: Progressing   Problem: RH SKIN INTEGRITY Goal: RH STG SKIN FREE OF INFECTION/BREAKDOWN Description: Manage skin free of infection with supervision assistance Outcome: Progressing   Problem: RH SAFETY Goal: RH STG ADHERE TO SAFETY PRECAUTIONS W/ASSISTANCE/DEVICE Description: STG Adhere to Safety Precautions With supervision  Assistance/Device. Outcome: Progressing   Problem: RH PAIN MANAGEMENT Goal: RH STG PAIN MANAGED AT OR BELOW PT'S PAIN GOAL Description: <4 w/ prns Outcome: Progressing   Problem: RH KNOWLEDGE DEFICIT GENERAL Goal: RH STG INCREASE KNOWLEDGE OF SELF CARE AFTER HOSPITALIZATION Description: Manage increase  knowledge of self care after hospitalization with supervision assistance from wife using educational materials provided Outcome: Progressing

## 2024-02-18 NOTE — Progress Notes (Signed)
 Physical Therapy Session Note  Patient Details  Name: John [name] MRN: [id number] Date of Birth: [birth year]  Today's Date: 02/18/2024 PT Individual Time: 8964-8884 PT Individual Time Calculation (min): 40 min   Short Term Goals: Week 2:  PT Short Term Goal 1 (Week 2): STG = LTG due to ELOS  Skilled Therapeutic Interventions/Progress Updates: Patient semi-reclined in bed on entrance to room. Patient alert and agreeable to PT session.   Patient reported unrated pain (premedicated)  Therapeutic Activity: Bed Mobility: Pt performed supine<sit on EOB with modA (HOB elevated). VC required for sequence and hand placement. PTA donned personal pants and threaded foley through R LE. Pt unable to bridge to donn pants supine in bed (PTA assisted with donning around waist once standing EOB). Transfers: Pt performed sit<>stand transfers throughout session with RW and modA from EOB, and minA from WC. Provided VC for hand placement (using B UE's to push off arm rest).  Neuromuscular Re-ed: NMR facilitated during session with focus on dynamic standing balance, coordination. - Pt performed multiple rounds of tossing bean bags to cornhole board with L UE support on RW. Pt required mostly modA that progressed to minA when adhering to cue to shift weight to R per L lean. Pt improved in coordination with cue to increase R UE shoulder extension. Pt required rest breaks throughout.   NMR performed for improvements in motor control and coordination, balance, sequencing, judgement, and self confidence/ efficacy in performing all aspects of mobility at highest level of independence.   Patient sitting in WC at end of session with brakes locked, belt alarm set, and all needs within reach.      Therapy Documentation Precautions:  Precautions Precautions: Fall Precaution Booklet Issued: No Recall of Precautions/Restrictions: Impaired Precaution/Restrictions Comments: L hemiparesis Restrictions Weight  Bearing Restrictions Per Provider Order: No Therapy/Group: Individual Therapy  Brinley Rosete PTA 02/18/2024, 12:09 PM

## 2024-02-18 NOTE — Progress Notes (Signed)
 PROGRESS NOTE   Subjective/Complaints: No new complaints this morning, somnolent Patient's chart reviewed- No issues reported overnight Vitals signs show HTN, hypothermia, and bradycardia  BM 11/6 x2  ROS: Denies fevers, chills, N/V, abdominal pain, nausea, constipation, diarrhea, SOB, cough, chest pain, new weakness or paraesthesias.    Objective:   No results found. Recent Labs    02/16/24 0505  WBC 8.8  HGB 11.2*  HCT 34.5*  PLT 250    Recent Labs    02/16/24 0505 02/18/24 0534  NA 142 144  K 3.6 3.6  CL 106 106  CO2 25 23  GLUCOSE 95 85  BUN 25* 16  CREATININE 1.19 0.97  CALCIUM  8.9 8.7*    Intake/Output Summary (Last 24 hours) at 02/18/2024 1244 Last data filed at 02/18/2024 0419 Gross per 24 hour  Intake 300 ml  Output 1700 ml  Net -1400 ml        Physical Exam: Vital Signs Blood pressure (!) 154/64, pulse (!) 59, temperature (!) 97.5 F (36.4 C), temperature source Oral, resp. rate 16, height 6' (1.829 m), weight 76.9 kg, SpO2 100%.  Constitutional: No apparent distress. Sitting up in [facility] in therapy gym. HENT: Atraumatic, normocephalic. Cardiovascular: Bradycardia, No Edema.   Respiratory: Clear to auscultation bilaterally.  No rales, rhonchi, or wheezing. On RA.  Abdomen: + bowel sounds, normoactive. No distention or tenderness.  GU: Not examined. +Foley, draining clear urine.  Skin: C/D/I. No apparent lesions or rashes noted.    MSK:      No apparent deformity.       Neurologic exam:  AAO to person, place; time with cues. + Mild to moderate cognitive deficits--waxes and wanes, consistently more lethargic/confused in the morning   -Able to perform basic math, to complex reasoning, abstraction; remembers 1/3 items at 5 minutes  Insight: Fair insight into current condition.  Mood: Pleasant affect, appropriate mood.  Sensation: To light touch intact in BL UEs and LEs  Reflexes: Negative  Hoffman's and babinski signs bilaterally.  CN: 2-12 grossly intact.  Coordination: Mild left upper and lower extremity ataxia unchanged Spasticity: MAS 0 in all extremities.   Strength; antigravity against resistance bilateral upper and lower extremities, 4-5- /5 in left upper and lower extremities, 5/5 in right upper and lower extremities   Physical exam unchanged from the above on reexamination 02/18/24    Assessment/Plan: 1. Functional deficits which require 3+ hours per day of interdisciplinary therapy in a comprehensive inpatient rehab setting. Physiatrist is providing close team supervision and 24 hour management of active medical problems listed below. Physiatrist and rehab team continue to assess barriers to discharge/monitor patient progress toward functional and medical goals  Care Tool:  Bathing    Body parts bathed by patient: Right arm, Chest, Left arm, Abdomen, Face, Right upper leg, Left upper leg, Right lower leg, Left lower leg, Front perineal area, Buttocks   Body parts bathed by helper: Front perineal area, Buttocks     Bathing assist Assist Level: Contact Guard/Touching assist     Upper Body Dressing/Undressing Upper body dressing   What is the patient wearing?: Pull over shirt    Upper body assist Assist Level: Set  up assist    Lower Body Dressing/Undressing Lower body dressing      What is the patient wearing?: Pants, Incontinence brief     Lower body assist Assist for lower body dressing: Moderate Assistance - Patient 50 - 74%     Toileting Toileting    Toileting assist Assist for toileting: 2 Helpers     Transfers Chair/bed transfer  Transfers assist     Chair/bed transfer assist level: Minimal Assistance - Patient > 75%     Locomotion Ambulation   Ambulation assist      Assist level: Minimal Assistance - Patient > 75% Assistive device: Walker-rolling Max distance: 100   Walk 10 feet activity   Assist     Assist level:  Minimal Assistance - Patient > 75% Assistive device: Walker-rolling   Walk 50 feet activity   Assist    Assist level: Minimal Assistance - Patient > 75% Assistive device: Walker-rolling    Walk 150 feet activity   Assist Walk 150 feet activity did not occur: Safety/medical concerns (Max distance 112')         Walk 10 feet on uneven surface  activity   Assist Walk 10 feet on uneven surfaces activity did not occur: Safety/medical concerns         Wheelchair     Assist Is the patient using a wheelchair?: Yes Type of Wheelchair: Manual    Wheelchair assist level: Dependent - Patient 0%      Wheelchair 50 feet with 2 turns activity    Assist        Assist Level: Dependent - Patient 0%   Wheelchair 150 feet activity     Assist      Assist Level: Dependent - Patient 0%   Blood pressure (!) 154/64, pulse (!) 59, temperature (!) 97.5 F (36.4 C), temperature source Oral, resp. rate 16, height 6' (1.829 m), weight 76.9 kg, SpO2 100%.  1. Functional deficits secondary to acute pontine infarct             -patient may  shower             -ELOS/Goals: 9-12 days Min A to supervision -discharge to SNF on 11-10              - stable to continue CIR  - 10/27: Will recheck U/A/culture, does not appear as oriented this AM as prior notes, Discussed with therapy- not felt to be off his usual baseline as he waxes and waves, nursing reports oriented x3 later in day, continue to monitor    - 10/28: Per therapies at cognitive baseline. Min A bed mobility - up to 300 ft with RW with Min A. Self-limiting and some cognitive issues. Setup UBD, L lateral lean with sitting and standing. Poor motivation /apathy huge. Some L inattention. Moderate cognitive deficits,  effected by self-limiting and poor participation. Very defiant behaviorally.   - Behavior plan placed by team  - Therapy teams planning therapy schedule in the late morning and early afternoon due to  consistent a.m. lethargy and confusion; wife states this is consistent with how he is at home, generally does not wake up before lunch.   - 11/4: Fluctuant, Max A to supervision for bed mobility depending on mood and motivation. Walking less up to 60 ft, increasing L lateral lean noted. Behaviorally less engaged the longer he is here. Flustuates in OT depending on time of day.   SNF suggested, although appears patient does not have many days  left.  Min A UB/Mod A LB goals. Has no buy-in with SLP. Family training tomorrow.   Discussed with [facility] plan for d/c to SNF on Monday   2.  Antithrombotics: -DVT/anticoagulation:  Pharmaceutical: continue Lovenox              -antiplatelet therapy: Aspirin  and  Plavix  until 10/28 then aspirin  alone--finished Plavix    3. Pain Management: continue Oxycodone  and Tylenol  as needed--well-controlled   4. Mood/Behavior/Sleep: LCSW to follow for evaluation and support when available.              -antipsychotic agents: Lexapro  10 mg daily - Delirium : continue Seroquel  25 mg nightly--tolerating well  --waxes/wanes - 10/28: Move seroquel  to PRN, add mirtazepine 15 mg at bedtime for sleep and mood 10/29: Poor sleep since removal of seroquel ; resume at 25 mg at bedtime 10/30: Overly sedated this a.m.  Reduce mirtazapine to 7.5 mg nightly.  Therapies adjusting for afternoon schedules 10-31: Again, increasingly lethargic into the later afternoon.  DC Seroquel , continue mirtazapine 7.5 mg nightly, change as needed to Benadryl  25 mg given patient reports of itching sensation as he falls asleep without objective rashes. 11/1 reports slept better yesterday, doesn't appear that he got as needed Benadryl  11-4: Patient reports better sleep, monitor--doing well with current medications.  Behaviorally, engaging less with therapies, feels he is mostly self-limited and has little therapy buy-in at this point.  Mood, interaction is appropriate on my exam.   - Would consider increase  in Lexapro , has history of severe headaches with sertraline so would not change agent.  Defer to outpatient since we just started mirtazapine 11/6: on chart review, Hx wellbutrin  - will d/w patient today - he endorses good mood, no SI/HI, simply tired of being in the hospital which is reasonable   5. Neuropsych/cognition: This patient may be intermittently capable of making decisions on their own behalf.    - 11-4: Consistently better in the afternoons in the late morning; consistent with reports from his wife.   - 11/6: Start aricept 5 mg at bedtime for memory decline  6. Skin/Wound Care: Routine pressure relief measures             -Stage 2 Sacral wound--wound care orders placed--Gerhardt's butt cream +Zinc  PO   7. Fluids/Electrolytes/Nutrition: Monitor intake and output.  CBC/CMET in a.m.             - SLP consult             -GERD: Continue PPI   -10-25: Albumin 2.4.  Encourage p.o. intakes.  - BMP ordered 10-29 due to orthostasis, possible hypovolemia--a.m. labs stable.  10/30: Cr increase to 1.1; give 1L IVF today; BMP in AM   10-31: BMP pending, encouraging p.o. fluids  11/2 rechecked BMP yesterday- Cr slightly down to 1.08, encourage oral fluids  11-3: Creatinine 0.8.  Resolved, monitor 11/6: cr back up, DC metformin , encourage PO--give 1 day off medication, repeat on 11-8; may need IV fluid repletion  8.  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  Atorvastatin  80 mg daily.   9.  Acute metabolic encephalopathy: Secondary to aspiration pneumonia and hypotension.  Mentation improved continue to monitor.   10. Hypomagnesia: Mg 1.4<2.0<1.5< now 1.6 monitor and replace.    Repeat 11-8 11.  Bladder outlet obstruction: Chronic foley catheter last changed 10/18; 10/31             -continue CHG and foley catheter care.  - f/u  with urology outpatient.    12.  HTN: resumed home Losartan  25 mg daily, monitor BP with increased activity for  hypotension    - 10-26: BP slightly elevated this a.m., back down this afternoon.  Monitor    -10/27 BP controlled, overall, continue current regimen   10-28: Orthostatic hypotension with therapies, severe.  Hold losartan  for tomorrow AM.  Encourage p.o. fluids today.  - 10/29: Hypertensive overnight but orthostatic with therapies consistnetly 10/30:  give 1L IVF today, teds, binder   10-31: BP slightly improved, remains low out of bed with teds, patient asymptomatic.  Encouraging p.o. fluids, may need additional bolus   BP controlled overall, continue current regimen      02/18/2024    4:13 AM 02/17/2024    7:20 PM 02/17/2024    5:02 AM  Vitals with BMI  Systolic 154 135 877  Diastolic 64 70 59  Pulse 59 73 60     13.  T2DM: A1c 8.5% on 10/7  -monitor cbgs ac/hs with Novolog  SSI and Lantus  15 units  -10-26: Blood sugars consistently elevated, increase Semglee  to 20 units daily -10/27 increase semglee  to 23 units - 10/28 appears semglee  was Dced?? Add back at 23 U -10-29: Low in a.m., apparent under coverage for meals.  Resume home metformin  500 mg twice daily.  May need more Premeal added. 10/30: BG better controlled with current regimen, monitor 11/4: BG elevated recently; increase Semglee  to 25 units 11-5: PM low of 74, monitor for recurrence today. 11/6: AKI; DC metformin , continue semglee  25 11-7: Blood sugars variable, higher in the afternoons and lower in the morning, but overall well-controlled. Recent Labs    02/17/24 2138 02/18/24 0605 02/18/24 1126  GLUCAP 191* 80 77     14.CAD/HLD: Atorvastatin    15.  Severe sepsis due to aspiration RLL PNA: Initially treated with IV meropenem now transition to p.o. Augmentin  ends 10/24.  ID consulted, Dr. Fleeta Rothman recommended exchanging Foley due to fungemia.             - Aspiration precautions             - Palliative care consult: Patient remains DNR limited  - 10-31: Foley again exchanged, culture positive for less than 70,000  colonies of yeast, Foley exchange should have been sufficient.  Monitor.   16. Cholelithiasis: Maybe chronic, no current GI symptoms. Continue to monitor.    17.  Heart block: S/p PPM    - Heart rate well-controlled, monitor    02/18/2024    4:13 AM 02/17/2024    7:20 PM 02/17/2024    5:02 AM  Vitals with BMI  Systolic 154 135 877  Diastolic 64 70 59  Pulse 59 73 60    18.  Constipation/neurogenic bowel.  Last bowel movement 10-20.  Wife endorses after his last stroke, he was incontinent, improved after leaving hospital.  - Start Senokot S1 tab twice daily   - Miralax  PRN--10-26 add to daily 11-3: Bowel movement, continent.  Seems to be slowing down. 11-6: X 2 BM.  Reduce Senokot-S to 1 tablet nightly, change MiraLAX  to daily as needed  19. Itching -Reports after he was given chlorhexadine or similar type product?, discussed nursing, will not use this today.  Chlorhexidine  was discontinued -Benedryl prn, sarna prn--used twice on 11-2.  No obvious rash on exam. -Resolved 11-5  20. Hypomagnesmia: magnesium  level reviewed and is 1.3 on 11/8, supplement 2 grams IV  LOS: 15 days A FACE TO FACE EVALUATION  WAS PERFORMED  Sven SHAUNNA Elks 02/18/2024, 12:44 PM

## 2024-02-18 NOTE — Plan of Care (Signed)
  Problem: Consults Goal: RH GENERAL PATIENT EDUCATION Description: See Patient Education module for education specifics. Outcome: Progressing   Problem: RH BLADDER ELIMINATION Goal: RH STG MANAGE BLADDER WITH ASSISTANCE Description: STG Manage Bladder With supervision Assistance Outcome: Progressing   Problem: RH SKIN INTEGRITY Goal: RH STG SKIN FREE OF INFECTION/BREAKDOWN Description: Manage skin free of infection with supervision assistance Outcome: Progressing

## 2024-02-18 NOTE — Progress Notes (Signed)
 Physical Therapy Session Note  Patient Details  Name: John [name] MRN: [id number] Date of Birth: 24-Jun-1945  Today's Date: 02/19/2024 PT Individual Time: 0930-1025  PT Individual Time Calculation (min): 55 min  Short Term Goals: Week 2:  PT Short Term Goal 1 (Week 2): STG = LTG due to ELOS  Skilled Therapeutic Interventions/Progress Updates:  Chart reviewed and pt agreeable to therapy. Pt received semi-reclined in bed with 0/10 c/o pain. Also of note, pt reports pain in back with mobility. Session focused on bed mobility, functional transfers, ambulation, and stair navigation to promote safe home mobility and access and review function for pending dc. Pt initiated session with review of function as noted in dc note. Pt then completed transfer to chair using CGA + RW, blocked practice of amb of 20-90ft using MinA + RW (limited by pain) and navigation of 3 steps using B rails + minA. Pt required rests 2/2 pain. PT also provided education on standing technique to reduce pain, though pt reported no change in pain with technique. PT also educated on step pattern for safety with stairs. PT and pt discussed possible length of recovery post-stroke and importance of continued rehab.   At end of session, pt was left seated in [facility] with alarm engaged, nurse call bell and all needs in reach.     Therapy Documentation Precautions:  Precautions Precautions: Fall Precaution Booklet Issued: No Recall of Precautions/Restrictions: Impaired Precaution/Restrictions Comments: L hemiparesis Restrictions Weight Bearing Restrictions Per Provider Order: No General:      Therapy/Group: Individual Therapy   [name] 02/19/2024, 12:02 PM

## 2024-02-19 DIAGNOSIS — I639 Cerebral infarction, unspecified: Secondary | ICD-10-CM | POA: Diagnosis not present

## 2024-02-19 LAB — GLUCOSE, CAPILLARY
Glucose-Capillary: 110 mg/dL — ABNORMAL HIGH (ref 70–99)
Glucose-Capillary: 310 mg/dL — ABNORMAL HIGH (ref 70–99)
Glucose-Capillary: 169 mg/dL — ABNORMAL HIGH (ref 70–99)
Glucose-Capillary: 170 mg/dL — ABNORMAL HIGH (ref 70–99)

## 2024-02-19 MED ORDER — MAGNESIUM CITRATE PO SOLN
1.0000 | Freq: Once | ORAL | Status: DC
  Filled 2024-02-19: qty 296

## 2024-02-19 MED ORDER — INSULIN GLARGINE-YFGN 100 UNIT/ML ~~LOC~~ SOLN
26.0000 [IU] | Freq: Every day | SUBCUTANEOUS | Status: DC
  Administered 2024-02-20: 26 [IU] via SUBCUTANEOUS
  Filled 2024-02-19: qty 0.26

## 2024-02-19 NOTE — Plan of Care (Signed)
  Problem: Consults Goal: RH GENERAL PATIENT EDUCATION Description: See Patient Education module for education specifics. Outcome: Progressing   Problem: RH BOWEL ELIMINATION Goal: RH STG MANAGE BOWEL WITH ASSISTANCE Description: STG Manage Bowel with  supervision Assistance. Outcome: Progressing   Problem: RH BLADDER ELIMINATION Goal: RH STG MANAGE BLADDER WITH ASSISTANCE Description: STG Manage Bladder With supervision Assistance Outcome: Progressing

## 2024-02-19 NOTE — Progress Notes (Signed)
 Physical Therapy Discharge Summary  Patient Details  Name: John [name] MRN: [id number] Date of Birth: 1945/06/16  Date of Discharge from PT service:{MONTH:10108} {NUMBERS 1-31 (DATE):31396}, {YEAR HISTORY:31397}  {CHL IP REHAB PT TIME CALCULATION:304800500}   Patient has met {NUMBERS 0-12:18577} of {NUMBERS 0-12:18577} long term goals due to {due un:6958322}.  Patient to discharge at [facility] level {LOA:3049010}.   Patient's care partner {care partner:3041650} to provide the necessary {assistance:3041652} assistance at discharge.  Reasons goals not met: ***  Recommendation:  Patient will benefit from ongoing skilled PT services in {setting:3041680} to continue to advance safe functional mobility, address ongoing impairments in ***, and minimize fall risk.  Equipment: {equipment:3041657}  Reasons for discharge: {Reason for discharge:3049018}  Patient/family agrees with progress made and goals achieved: {Pt/Family agree with progress/goals:3049020}  PT Discharge Precautions/Restrictions Precautions Precautions: Fall Restrictions Weight Bearing Restrictions Per Provider Order: No Vital Signs   Pain Pain Assessment Pain Scale: 0-10 Pain Score: 0-No pain Pain Interference Pain Interference Pain Effect on Sleep: 1. Rarely or not at all Pain Interference with Therapy Activities: 2. Occasionally Pain Interference with Day-to-Day Activities: 2. Occasionally Vision/Perception  Vision - History Ability to See in Adequate Light: 0 Adequate  Cognition Overall Cognitive Status: Impaired/Different from baseline Arousal/Alertness: Awake/alert Orientation Level: Oriented X4 Sensation Sensation Light Touch: Appears Intact Motor  Motor Motor: Hemiplegia Motor - Discharge Observations: L hemi  Mobility Bed Mobility Bed Mobility: Rolling Right;Right Sidelying to Sit Rolling Right: Supervision/verbal cueing Right Sidelying to Sit: Supervision/Verbal cueing Sit to  Supine: Supervision/Verbal cueing Transfers Transfers: Sit to Stand;Stand to Sit;Stand Pivot Transfers Sit to Stand: Contact Guard/Touching assist Stand to Sit: Contact Guard/Touching assist Stand Pivot Transfers: Contact Guard/Touching assist Transfer (Assistive device): Rolling walker Locomotion  Gait Ambulation: Yes Gait Assistance: Contact Guard/Touching assist Gait Distance (Feet): 30 Feet Assistive device: Rolling walker Gait Gait: Yes Gait Pattern: Poor foot clearance - left;Trunk flexed;Step-to pattern Stairs / Additional Locomotion Stairs: Yes Stairs Assistance: Minimal Assistance - Patient > 75% Stair Management Technique: Two rails Number of Stairs: 4  Trunk/Postural Assessment  Cervical Assessment Cervical Assessment: Exceptions to [facility] (forward head) Thoracic Assessment Thoracic Assessment: Exceptions to [facility] (rouned shoulders) Lumbar Assessment Lumbar Assessment: Exceptions to WFL (PPT) Postural Control Postural Control: Deficits on evaluation Righting Reactions: delayed  Balance Balance Balance Assessed: Yes Static Sitting Balance Static Sitting - Balance Support: Feet supported Static Sitting - Level of Assistance: 5: Stand by assistance Dynamic Sitting Balance Dynamic Sitting - Balance Support: Feet supported Dynamic Sitting - Level of Assistance: 5: Stand by assistance Dynamic Sitting - Balance Activities: Reaching for objects Static Standing Balance Static Standing - Balance Support: During functional activity;Bilateral upper extremity supported Static Standing - Level of Assistance: 4: Min assist Dynamic Standing Balance Dynamic Standing - Balance Support: Bilateral upper extremity supported;During functional activity Dynamic Standing - Level of Assistance: 5: Stand by assistance Extremity Assessment      RLE Assessment RLE Assessment: Within Functional Limits LLE Assessment LLE Assessment: Exceptions to [facility] General Strength Comments: gross  4-/5   [name] 02/19/2024, 10:12 AM

## 2024-02-19 NOTE — Plan of Care (Signed)
  Problem: Consults Goal: RH GENERAL PATIENT EDUCATION Description: See Patient Education module for education specifics. 02/19/2024 0705 by Ann Axel HERO, RN Outcome: Progressing 02/19/2024 0704 by Ann Axel HERO, RN Outcome: Progressing 02/19/2024 0704 by Ann Axel HERO, RN Outcome: Progressing   Problem: RH BOWEL ELIMINATION Goal: RH STG MANAGE BOWEL WITH ASSISTANCE Description: STG Manage Bowel with supervision Assistance. 02/19/2024 0705 by Ann Axel HERO, RN Outcome: Progressing 02/19/2024 0704 by Ann Axel HERO, RN Outcome: Progressing   Problem: RH BLADDER ELIMINATION Goal: RH STG MANAGE BLADDER WITH ASSISTANCE Description: STG Manage Bladder With supervision Assistance 02/19/2024 0705 by Ann Axel HERO, RN Outcome: Progressing 02/19/2024 0704 by Ann Axel HERO, RN Outcome: Progressing 02/19/2024 0704 by Ann Axel HERO, RN Outcome: Progressing   Problem: RH SKIN INTEGRITY Goal: RH STG SKIN FREE OF INFECTION/BREAKDOWN Description: Manage skin free of infection with supervision assistance Outcome: Progressing

## 2024-02-19 NOTE — Progress Notes (Signed)
 Speech Language Pathology Discharge Summary  Patient Details  Name: John [name] MRN: [id number] Date of Birth: 1945/07/27  Date of Discharge from SLP service:February 19, 2024  Today's Date: 02/19/2024 SLP Individual Time: 8699-8654 SLP Individual Time Calculation (min): 45 min   Skilled Therapeutic Interventions:  Pt seen this date for ST prior to d/c to [place] facility planned for 11/10. Pt alert upon SLP arrival - denied pain. SLP completed pt's d/c from ST services. Pt reported desire to recall phone numbers and how to use his new cell phone. SLP encouraged him to let SLP at next level of care know about these goals and to be open-minded to continued ST. Pt reported limiting beliefs about his ability to make progress d/t his age and hx of CVAs. SLP provided active listening then counseled pt on progress that can be made despite those factors and again encouraged further ST at SNF. Pt verbalized understanding. Pt left in chair with call bell in reach and chair alarm active.   Reasons goals not met: n/a    Clinical Impression/Discharge Summary:  Per pt's primary SLP, despite meeting 4/4 LTGs, minimal to no progress noted this stay d/t pt participation and behaviors. Pt pariticipation continues to negatively impact differentiation between behaviors and true cognitive deficits, however, he continues to present w/ moderate cognitive deficits in the areas of attention, initiation, STM, and problem solving. He also continues to demonstrate no insight into physical/cognitive deficits. He will require 24/7 supervision and SNF is recommended d/t burden of care. Pt/family education complete. Pending pt participation, he would benefit from continued ST upon d/c to target remaining cognitive deficits, maximize pt independence, and reduce caregiver burden.   Care Partner:  Caregiver Able to Provide Assistance: No  Type of Caregiver Assistance: Cognitive;Physical   Recommendation:  Skilled  Nursing facility;24 hour supervision/assistance  Rationale for SLP Follow Up: Maximize cognitive function and independence;Reduce caregiver burden    Equipment: n/a    Reasons for discharge: Discharged from hospital      Waddell JONETTA Novak, MA CCC-SLP 02/19/2024, 4:32 PM

## 2024-02-19 NOTE — Plan of Care (Signed)
  Problem: Consults Goal: RH GENERAL PATIENT EDUCATION Description: See Patient Education module for education specifics. 02/19/2024 0704 by Ann Axel HERO, RN Outcome: Progressing 02/19/2024 0704 by Ann Axel HERO, RN Outcome: Progressing   Problem: RH BOWEL ELIMINATION Goal: RH STG MANAGE BOWEL WITH ASSISTANCE Description: STG Manage Bowel with supervision Assistance. Outcome: Progressing   Problem: RH BLADDER ELIMINATION Goal: RH STG MANAGE BLADDER WITH ASSISTANCE Description: STG Manage Bladder With supervision Assistance 02/19/2024 0704 by Ann Axel HERO, RN Outcome: Progressing 02/19/2024 0704 by Ann Axel HERO, RN Outcome: Progressing   Problem: RH SKIN INTEGRITY Goal: RH STG SKIN FREE OF INFECTION/BREAKDOWN Description: Manage skin free of infection with supervision assistance Outcome: Progressing

## 2024-02-19 NOTE — Progress Notes (Signed)
 PROGRESS NOTE   Subjective/Complaints: No new complaints this morning Continues to have chronic back pain, offered kpad but he defers Working with therapy  BM 11/6 x2  ROS: Denies fevers, chills, N/V, abdominal pain, nausea, constipation, diarrhea, SOB, cough, chest pain, new weakness or paraesthesias.  +chronic lower back pain  Objective:   No results found. No results for input(s): WBC, HGB, HCT, PLT in the last 72 hours.   Recent Labs    02/18/24 0534  NA 144  K 3.6  CL 106  CO2 23  GLUCOSE 85  BUN 16  CREATININE 0.97  CALCIUM  8.7*    Intake/Output Summary (Last 24 hours) at 02/19/2024 1328 Last data filed at 02/19/2024 9386 Gross per 24 hour  Intake 297 ml  Output 600 ml  Net -303 ml        Physical Exam: Vital Signs Blood pressure (!) 120/56, pulse (!) 59, temperature 98 F (36.7 C), temperature source Oral, resp. rate 18, height 6' (1.829 m), weight 76.9 kg, SpO2 96%.  Constitutional: No apparent distress. Sitting up in [facility] in therapy gym. HENT: Atraumatic, normocephalic. Cardiovascular: Bradycardia, No Edema.   Respiratory: Clear to auscultation bilaterally.  No rales, rhonchi, or wheezing. On RA.  Abdomen: + bowel sounds, normoactive. No distention or tenderness.  GU: Not examined. +Foley, draining clear urine.  Skin: C/D/I. No apparent lesions or rashes noted.    MSK:      No apparent deformity.       Neurologic exam:  AAO to person, place; time with cues. + Mild to moderate cognitive deficits--waxes and wanes, consistently more lethargic/confused in the morning   -Able to perform basic math, to complex reasoning, abstraction; remembers 1/3 items at 5 minutes  Insight: Fair insight into current condition.  Mood: Pleasant affect, appropriate mood.  Sensation: To light touch intact in BL UEs and LEs  Reflexes: Negative Hoffman's and babinski signs bilaterally.  CN: 2-12 grossly  intact.  Coordination: Mild left upper and lower extremity ataxia unchanged Spasticity: MAS 0 in all extremities.   Strength; antigravity against resistance bilateral upper and lower extremities, 4-5- /5 in left upper and lower extremities, 5/5 in right upper and lower extremities, stable 11/9   Physical exam unchanged from the above on reexamination 02/19/24    Assessment/Plan: 1. Functional deficits which require 3+ hours per day of interdisciplinary therapy in a comprehensive inpatient rehab setting. Physiatrist is providing close team supervision and 24 hour management of active medical problems listed below. Physiatrist and rehab team continue to assess barriers to discharge/monitor patient progress toward functional and medical goals  Care Tool:  Bathing    Body parts bathed by patient: Right arm, Chest, Left arm, Abdomen, Face, Right upper leg, Left upper leg, Right lower leg, Left lower leg, Front perineal area, Buttocks   Body parts bathed by helper: Front perineal area, Buttocks     Bathing assist Assist Level: Contact Guard/Touching assist     Upper Body Dressing/Undressing Upper body dressing   What is the patient wearing?: Pull over shirt    Upper body assist Assist Level: Set up assist    Lower Body Dressing/Undressing Lower body dressing  What is the patient wearing?: Pants, Incontinence brief     Lower body assist Assist for lower body dressing: Moderate Assistance - Patient 50 - 74%     Toileting Toileting    Toileting assist Assist for toileting: 2 Helpers     Transfers Chair/bed transfer  Transfers assist     Chair/bed transfer assist level: Contact Guard/Touching assist     Locomotion Ambulation   Ambulation assist      Assist level: Minimal Assistance - Patient > 75% Assistive device: Walker-rolling Max distance: 30   Walk 10 feet activity   Assist     Assist level: Contact Guard/Touching assist Assistive device:  Walker-rolling   Walk 50 feet activity   Assist    Assist level: Minimal Assistance - Patient > 75% Assistive device: Walker-rolling    Walk 150 feet activity   Assist Walk 150 feet activity did not occur: Safety/medical concerns (Max distance 112')         Walk 10 feet on uneven surface  activity   Assist Walk 10 feet on uneven surfaces activity did not occur: Safety/medical concerns         Wheelchair     Assist Is the patient using a wheelchair?: Yes Type of Wheelchair: Manual    Wheelchair assist level: Dependent - Patient 0%      Wheelchair 50 feet with 2 turns activity    Assist        Assist Level: Dependent - Patient 0%   Wheelchair 150 feet activity     Assist      Assist Level: Dependent - Patient 0%   Blood pressure (!) 120/56, pulse (!) 59, temperature 98 F (36.7 C), temperature source Oral, resp. rate 18, height 6' (1.829 m), weight 76.9 kg, SpO2 96%.  1. Functional deficits secondary to acute pontine infarct             -patient may  shower             -ELOS/Goals: 9-12 days Min A to supervision -discharge to SNF on 11-10              - stable to continue CIR  - 10/27: Will recheck U/A/culture, does not appear as oriented this AM as prior notes, Discussed with therapy- not felt to be off his usual baseline as he waxes and waves, nursing reports oriented x3 later in day, continue to monitor    - 10/28: Per therapies at cognitive baseline. Min A bed mobility - up to 300 ft with RW with Min A. Self-limiting and some cognitive issues. Setup UBD, L lateral lean with sitting and standing. Poor motivation /apathy huge. Some L inattention. Moderate cognitive deficits,  effected by self-limiting and poor participation. Very defiant behaviorally.   - Behavior plan placed by team  - Therapy teams planning therapy schedule in the late morning and early afternoon due to consistent a.m. lethargy and confusion; wife states this is  consistent with how he is at home, generally does not wake up before lunch.   - 11/4: Fluctuant, Max A to supervision for bed mobility depending on mood and motivation. Walking less up to 60 ft, increasing L lateral lean noted. Behaviorally less engaged the longer he is here. Flustuates in OT depending on time of day.   SNF suggested, although appears patient does not have many days left.  Min A UB/Mod A LB goals. Has no buy-in with SLP. Family training tomorrow.   Discussed with [facility] plan for  d/c to SNF on Monday   2.  Antithrombotics: -DVT/anticoagulation:  Pharmaceutical: continue Lovenox              -antiplatelet therapy: Aspirin  and  Plavix  until 10/28 then aspirin  alone--finished Plavix    3. Pain Management: continue Oxycodone  and Tylenol  as needed--well-controlled   4. Mood/Behavior/Sleep: LCSW to follow for evaluation and support when available.              -antipsychotic agents: Lexapro  10 mg daily - Delirium : continue Seroquel  25 mg nightly--tolerating well  --waxes/wanes - 10/28: Move seroquel  to PRN, add mirtazepine 15 mg at bedtime for sleep and mood 10/29: Poor sleep since removal of seroquel ; resume at 25 mg at bedtime 10/30: Overly sedated this a.m.  Reduce mirtazapine to 7.5 mg nightly.  Therapies adjusting for afternoon schedules 10-31: Again, increasingly lethargic into the later afternoon.  DC Seroquel , continue mirtazapine 7.5 mg nightly, change as needed to Benadryl  25 mg given patient reports of itching sensation as he falls asleep without objective rashes. 11/1 reports slept better yesterday, doesn't appear that he got as needed Benadryl  11-4: Patient reports better sleep, monitor--doing well with current medications.  Behaviorally, engaging less with therapies, feels he is mostly self-limited and has little therapy buy-in at this point.  Mood, interaction is appropriate on my exam.   - Would consider increase in Lexapro , has history of severe headaches with sertraline  so would not change agent.  Defer to outpatient since we just started mirtazapine 11/6: on chart review, Hx wellbutrin  - will d/w patient today - he endorses good mood, no SI/HI, simply tired of being in the hospital which is reasonable   5. Neuropsych/cognition: This patient may be intermittently capable of making decisions on their own behalf.    - 11-4: Consistently better in the afternoons in the late morning; consistent with reports from his wife.   - 11/6: Start aricept 5 mg at bedtime for memory decline  6. Skin/Wound Care: Routine pressure relief measures             -Stage 2 Sacral wound--wound care orders placed--Gerhardt's butt cream +Zinc  PO   7. Fluids/Electrolytes/Nutrition: Monitor intake and output.  CBC/CMET in a.m.             - SLP consult             -GERD: Continue PPI   -10-25: Albumin 2.4.  Encourage p.o. intakes.  - BMP ordered 10-29 due to orthostasis, possible hypovolemia--a.m. labs stable.  10/30: Cr increase to 1.1; give 1L IVF today; BMP in AM   10-31: BMP pending, encouraging p.o. fluids  11/2 rechecked BMP yesterday- Cr slightly down to 1.08, encourage oral fluids  11-3: Creatinine 0.8.  Resolved, monitor 11/6: cr back up, DC metformin , encourage PO--give 1 day off medication, repeat on 11-8; may need IV fluid repletion  8.  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  Atorvastatin  80 mg daily.   9.  Acute metabolic encephalopathy: Secondary to aspiration pneumonia and hypotension.  Mentation improved continue to monitor.   10. Hypomagnesia: Mg 1.4<2.0<1.5< now 1.6 monitor and replace.    Repeat 11-8 11.  Bladder outlet obstruction: Chronic foley catheter last changed 10/18; 10/31             -continue CHG and foley catheter care.  - f/u with urology outpatient.    12.  HTN: resumed home Losartan  25 mg daily, monitor BP with increased activity for hypotension    -  10-26: BP slightly elevated this a.m., back down  this afternoon.  Monitor    -10/27 BP controlled, overall, continue current regimen   10-28: Orthostatic hypotension with therapies, severe.  Hold losartan  for tomorrow AM.  Encourage p.o. fluids today.  - 10/29: Hypertensive overnight but orthostatic with therapies consistnetly 10/30:  give 1L IVF today, teds, binder   10-31: BP slightly improved, remains low out of bed with teds, patient asymptomatic.  Encouraging p.o. fluids, may need additional bolus   BP reviewed and is soft but not on anti-hypertensive agents      02/19/2024    5:54 AM 02/18/2024    7:56 PM 02/18/2024    1:49 PM  Vitals with BMI  Systolic 120 113 877  Diastolic 56 59 58  Pulse 59 64 74     13.  T2DM: A1c 8.5% on 10/7  -monitor cbgs ac/hs with Novolog  SSI and Lantus  15 units  -10-26: Blood sugars consistently elevated, increase Semglee  to 20 units daily -10/27 increase semglee  to 23 units - 10/28 appears semglee  was Dced?? Add back at 23 U -10-29: Low in a.m., apparent under coverage for meals.  Resume home metformin  500 mg twice daily.  May need more Premeal added. 10/30: BG better controlled with current regimen, monitor 11/4: BG elevated recently; increase Semglee  to 25 units 11-5: PM low of 74, monitor for recurrence today. 11/6: AKI; DC metformin , continue semglee  25 Increase semglee  to 26U Recent Labs    02/18/24 2117 02/19/24 0554 02/19/24 1137  GLUCAP 285* 110* 310*     14.CAD/HLD: continue Atorvastatin    15.  Severe sepsis due to aspiration RLL PNA: Initially treated with IV meropenem now transition to p.o. Augmentin  ends 10/24.  ID consulted, Dr. Fleeta Rothman recommended exchanging Foley due to fungemia.             - Aspiration precautions             - Palliative care consult: Patient remains DNR limited  - 10-31: Foley again exchanged, culture positive for less than 70,000 colonies of yeast, Foley exchange should have been sufficient.  Monitor.   16. Cholelithiasis: Maybe chronic, no current GI  symptoms. Continue to monitor.    17.  Heart block: S/p PPM    - Heart rate well-controlled, monitor    02/19/2024    5:54 AM 02/18/2024    7:56 PM 02/18/2024    1:49 PM  Vitals with BMI  Systolic 120 113 877  Diastolic 56 59 58  Pulse 59 64 74    18.  Constipation/neurogenic bowel.  Last bowel movement 10-20.  Wife endorses after his last stroke, he was incontinent, improved after leaving hospital.  - Start Senokot S1 tab twice daily   - Miralax  PRN--10-26 add to daily 11-3: Bowel movement, continent.  Seems to be slowing down. 11-6: X 2 BM.  Reduce Senokot-S to 1 tablet nightly, change MiraLAX  to daily as needed 11/9: see #20  19. Itching -Reports after he was given chlorhexadine or similar type product?, discussed nursing, will not use this today.  Chlorhexidine  was discontinued -Benedryl prn, sarna prn--used twice on 11-2.  No obvious rash on exam. -Resolved 11-5  20. Hypomagnesmia: magnesium  level reviewed and is 1.3 on 11/8, supplement 2 grams IV, messaged nursing regarding date of last BM- if 11/6 as documented may benefit from magnesium  citrate  LOS: 16 days A FACE TO FACE EVALUATION WAS PERFORMED  Bryker Fletchall P Dhrithi Riche 02/19/2024, 1:28 PM

## 2024-02-19 NOTE — Progress Notes (Signed)
 Occupational Therapy Session Note  Patient Details  Name: John [name] MRN: [id number] Date of Birth: 03-Nov-1945  Today's Date: 02/19/2024 OT Individual Time: 8584-8484 OT Individual Time Calculation (min): 60 min    Short Term Goals: Week 1:  OT Short Term Goal 1 (Week 1): Pt will complete LB dressing at Max A with AE as necessary OT Short Term Goal 1 - Progress (Week 1): Met OT Short Term Goal 2 (Week 1): Pt will complete toilet transfer at Max A with LRAD OT Short Term Goal 2 - Progress (Week 1): Met OT Short Term Goal 3 (Week 1): Pt will complete UB dressing with min A OT Short Term Goal 3 - Progress (Week 1): Met OT Short Term Goal 4 (Week 1): Pt will demo improved awareness to deficits by identifying one deficit with mod vc OT Short Term Goal 4 - Progress (Week 1): Not met Week 2:  OT Short Term Goal 1 (Week 2): Pt will complete full-body bathing with Min A. OT Short Term Goal 2 (Week 2): Pt will complete 3/3 toileting tasks with Min A. OT Short Term Goal 3 (Week 2): Pt will complete ambulatory transfers with CGA + LRAD. :     Skilled Therapeutic Interventions/Progress Updates:    Pt received in wc and asked pt if he would like to shower and pt stated he definitely was not in the mood to shower but willing to go to the gym.  CGA sit to stands from w/c and then from mat in gym.  Pt ambulated with CGA with RW to gym.   Pt sat on mat to engage in sit balance and LUE NMR. Pt has approximately 80 degrees of sh flexion sitting on edge of mat.    Pt worked on a/arom  of L shoulder using a variety of methods (rolling large ball in various directions, B hands on dowel bar, holding large ball and lifting arms).  Pt began to c/o back pain sitting unsupported.  His wife arrived and was able to see him stand up and ambulate with CGA.   Pt returned to the room and discussed her concerns about his incontinence. Recommended she speak with nursing staff at new SNF so they can work on a  plan.  Pt resting in wc with alarm belt on and all needs met.   Therapy Documentation Precautions:  Precautions Precautions: Fall Precaution Booklet Issued: No Recall of Precautions/Restrictions: Impaired Precaution/Restrictions Comments: L hemiparesis Restrictions Weight Bearing Restrictions Per Provider Order: No Therapy Vitals Temp: 97.7 F (36.5 C) Pulse Rate: 80 Resp: 18 BP: 124/66 Patient Position (if appropriate): Sitting Oxygen Therapy SpO2: 100 % O2 Device: Room Air Pain: Pain Assessment Pain Score: 0-No pain          Therapy/Group: Individual Therapy  John [name] 02/19/2024, 4:59 PM

## 2024-02-20 ENCOUNTER — Other Ambulatory Visit (HOSPITAL_COMMUNITY): Payer: Self-pay

## 2024-02-20 DIAGNOSIS — I639 Cerebral infarction, unspecified: Secondary | ICD-10-CM | POA: Diagnosis not present

## 2024-02-20 LAB — CBC
HCT: 32.2 % — ABNORMAL LOW (ref 39.0–52.0)
Hemoglobin: 10.7 g/dL — ABNORMAL LOW (ref 13.0–17.0)
MCH: 30 pg (ref 26.0–34.0)
MCHC: 33.2 g/dL (ref 30.0–36.0)
MCV: 90.2 fL (ref 80.0–100.0)
Platelets: 217 K/uL (ref 150–400)
RBC: 3.57 MIL/uL — ABNORMAL LOW (ref 4.22–5.81)
RDW: 16.4 % — ABNORMAL HIGH (ref 11.5–15.5)
WBC: 6.9 K/uL (ref 4.0–10.5)
nRBC: 0 % (ref 0.0–0.2)

## 2024-02-20 LAB — BASIC METABOLIC PANEL WITH GFR
Anion gap: 11 (ref 5–15)
BUN: 17 mg/dL (ref 8–23)
CO2: 25 mmol/L (ref 22–32)
Calcium: 8.8 mg/dL — ABNORMAL LOW (ref 8.9–10.3)
Chloride: 104 mmol/L (ref 98–111)
Creatinine, Ser: 0.97 mg/dL (ref 0.61–1.24)
GFR, Estimated: >60 mL/min (ref >60)
Glucose, Bld: 138 mg/dL — ABNORMAL HIGH (ref 70–99)
Potassium: 3.8 mmol/L (ref 3.5–5.1)
Sodium: 140 mmol/L (ref 135–145)

## 2024-02-20 LAB — MAGNESIUM: Magnesium: 1.4 mg/dL — ABNORMAL LOW (ref 1.7–2.4)

## 2024-02-20 LAB — GLUCOSE, CAPILLARY: Glucose-Capillary: 124 mg/dL — ABNORMAL HIGH (ref 70–99)

## 2024-02-20 MED ORDER — INSULIN GLARGINE-YFGN 100 UNIT/ML ~~LOC~~ SOLN: 26.0000 [IU] | Freq: Every day | SUBCUTANEOUS | Status: DC

## 2024-02-20 MED ORDER — MIRTAZAPINE 7.5 MG PO TABS: 7.5000 mg | ORAL_TABLET | Freq: Every day | ORAL | Status: DC

## 2024-02-20 MED ORDER — DONEPEZIL HCL 5 MG PO TABS: 5.0000 mg | ORAL_TABLET | Freq: Every day | ORAL | Status: DC

## 2024-02-20 MED ORDER — SENNOSIDES-DOCUSATE SODIUM 8.6-50 MG PO TABS: 1.0000 | ORAL_TABLET | Freq: Every day | ORAL | Status: DC

## 2024-02-20 MED ORDER — LIDOCAINE 5 % EX PTCH: 2.0000 | MEDICATED_PATCH | CUTANEOUS | Status: DC

## 2024-02-20 MED ORDER — MAGNESIUM OXIDE -MG SUPPLEMENT 400 (240 MG) MG PO TABS: 400.0000 mg | ORAL_TABLET | Freq: Every day | ORAL | Status: DC

## 2024-02-20 MED ORDER — CAMPHOR-MENTHOL 0.5-0.5 % EX LOTN: TOPICAL_LOTION | CUTANEOUS | Status: DC | PRN

## 2024-02-20 MED ORDER — POLYETHYLENE GLYCOL 3350 17 GM/SCOOP PO POWD
17.0000 g | Freq: Every day | ORAL | 0 refills | Status: AC | PRN
  Filled 2024-02-20: qty 238, 14d supply, fill #0

## 2024-02-20 MED ORDER — MAGNESIUM OXIDE -MG SUPPLEMENT 400 (240 MG) MG PO TABS
400.0000 mg | ORAL_TABLET | Freq: Every day | ORAL | Status: DC
  Administered 2024-02-20: 400 mg via ORAL
  Filled 2024-02-20: qty 1

## 2024-02-20 NOTE — Progress Notes (Signed)
 PROGRESS NOTE   Subjective/Complaints:  No events overnight. Vitals stable      02/20/2024    4:50 AM 02/19/2024    7:40 PM 02/19/2024    3:22 PM  Vitals with BMI  Systolic 147 134 875  Diastolic 59 64 66  Pulse 60 65 80    Recent Labs    02/19/24 1652 02/19/24 2101 02/20/24 0559  GLUCAP 169* 170* 124*     P.o. intakes appropriate   Foley for bladder output Last BM 11/9   ROS: Denies fevers, chills, N/V, abdominal pain, nausea, constipation, diarrhea, SOB, cough, chest pain, new weakness or paraesthesias.  +chronic lower back pain  Objective:   No results found. Recent Labs    02/20/24 0524  WBC 6.9  HGB 10.7*  HCT 32.2*  PLT 217     Recent Labs    02/18/24 0534 02/20/24 0524  NA 144 140  K 3.6 3.8  CL 106 104  CO2 23 25  GLUCOSE 85 138*  BUN 16 17  CREATININE 0.97 0.97  CALCIUM  8.7* 8.8*    Intake/Output Summary (Last 24 hours) at 02/20/2024 0805 Last data filed at 02/20/2024 0453 Gross per 24 hour  Intake 297 ml  Output 1500 ml  Net -1203 ml        Physical Exam: Vital Signs Blood pressure (!) 147/59, pulse 60, temperature 97.7 F (36.5 C), temperature source Axillary, resp. rate 18, height 6' (1.829 m), weight 76.9 kg, SpO2 98%.  Constitutional: No apparent distress. Sitting up in [facility] in therapy gym. HENT: Atraumatic, normocephalic. Cardiovascular: Bradycardia, No Edema.   Respiratory: Clear to auscultation bilaterally.  No rales, rhonchi, or wheezing. On RA.  Abdomen: + bowel sounds, normoactive. No distention or tenderness.  GU: Not examined. +Foley, draining clear urine.  Skin: C/D/I. No apparent lesions or rashes noted.    MSK:      No apparent deformity.       Neurologic exam:  AAO to person, place; time with cues. + Mild to moderate cognitive deficits--waxes and wanes, consistently more lethargic/confused in the morning   -Able to perform basic math, to complex  reasoning, abstraction; remembers 1/3 items at 5 minutes  Insight: Fair insight into current condition.  Mood: Pleasant affect, appropriate mood.  Sensation: To light touch intact in BL UEs and LEs  Reflexes: Negative Hoffman's and babinski signs bilaterally.  CN: 2-12 grossly intact.  Coordination: Mild left upper and lower extremity ataxia unchanged Spasticity: MAS 0 in all extremities.   Strength; antigravity against resistance bilateral upper and lower extremities, 4-5- /5 in left upper and lower extremities, 5/5 in right upper and lower extremities, stable 11/9   Physical exam unchanged from the above on reexamination 02/20/24    Assessment/Plan: 1. Functional deficits which require 3+ hours per day of interdisciplinary therapy in a comprehensive inpatient rehab setting. Physiatrist is providing close team supervision and 24 hour management of active medical problems listed below. Physiatrist and rehab team continue to assess barriers to discharge/monitor patient progress toward functional and medical goals  Care Tool:  Bathing    Body parts bathed by patient: Right arm, Chest, Left arm, Abdomen, Face, Right upper leg,  Left upper leg, Right lower leg, Left lower leg, Front perineal area, Buttocks   Body parts bathed by helper: Front perineal area, Buttocks     Bathing assist Assist Level: Contact Guard/Touching assist     Upper Body Dressing/Undressing Upper body dressing   What is the patient wearing?: Pull over shirt    Upper body assist Assist Level: Set up assist    Lower Body Dressing/Undressing Lower body dressing      What is the patient wearing?: Pants, Incontinence brief     Lower body assist Assist for lower body dressing: Moderate Assistance - Patient 50 - 74%     Toileting Toileting    Toileting assist Assist for toileting: Minimal Assistance - Patient > 75%     Transfers Chair/bed transfer  Transfers assist     Chair/bed transfer assist  level: Contact Guard/Touching assist     Locomotion Ambulation   Ambulation assist      Assist level: Minimal Assistance - Patient > 75% Assistive device: Walker-rolling Max distance: 30   Walk 10 feet activity   Assist     Assist level: Contact Guard/Touching assist Assistive device: Walker-rolling   Walk 50 feet activity   Assist    Assist level: Minimal Assistance - Patient > 75% Assistive device: Walker-rolling    Walk 150 feet activity   Assist Walk 150 feet activity did not occur: Safety/medical concerns (Max distance 112')         Walk 10 feet on uneven surface  activity   Assist Walk 10 feet on uneven surfaces activity did not occur: Safety/medical concerns         Wheelchair     Assist Is the patient using a wheelchair?: Yes Type of Wheelchair: Manual    Wheelchair assist level: Dependent - Patient 0%      Wheelchair 50 feet with 2 turns activity    Assist        Assist Level: Dependent - Patient 0%   Wheelchair 150 feet activity     Assist      Assist Level: Dependent - Patient 0%   Blood pressure (!) 147/59, pulse 60, temperature 97.7 F (36.5 C), temperature source Axillary, resp. rate 18, height 6' (1.829 m), weight 76.9 kg, SpO2 98%.  1. Functional deficits secondary to acute pontine infarct             -patient may  shower             -ELOS/Goals: 9-12 days Min A to supervision -discharge to SNF on 11-10              - stable to continue CIR  - 10/27: Will recheck U/A/culture, does not appear as oriented this AM as prior notes, Discussed with therapy- not felt to be off his usual baseline as he waxes and waves, nursing reports oriented x3 later in day, continue to monitor    - 10/28: Per therapies at cognitive baseline. Min A bed mobility - up to 300 ft with RW with Min A. Self-limiting and some cognitive issues. Setup UBD, L lateral lean with sitting and standing. Poor motivation /apathy huge. Some L  inattention. Moderate cognitive deficits,  effected by self-limiting and poor participation. Very defiant behaviorally.   - Behavior plan placed by team  - Therapy teams planning therapy schedule in the late morning and early afternoon due to consistent a.m. lethargy and confusion; wife states this is consistent with how he is at home, generally does not  wake up before lunch.   - 11/4: Fluctuant, Max A to supervision for bed mobility depending on mood and motivation. Walking less up to 60 ft, increasing L lateral lean noted. Behaviorally less engaged the longer he is here. Flustuates in OT depending on time of day.   SNF suggested, although appears patient does not have many days left.  Min A UB/Mod A LB goals. Has no buy-in with SLP. Family training tomorrow.   Discussed with [facility] plan for d/c to SNF on Monday   The patient is medically ready for discharge to SNF and will need follow-up with [facility] PM&R. In addition, they will need to follow up with their PCP, Neurology, Urology.    2.  Antithrombotics: -DVT/anticoagulation:  Pharmaceutical: continue Lovenox              -antiplatelet therapy: Aspirin  and  Plavix  until 10/28 then aspirin  alone--finished Plavix    3. Pain Management: continue Oxycodone  and Tylenol  as needed--well-controlled   4. Mood/Behavior/Sleep: LCSW to follow for evaluation and support when available.              -antipsychotic agents: Lexapro  10 mg daily - Delirium : continue Seroquel  25 mg nightly--tolerating well  --waxes/wanes - 10/28: Move seroquel  to PRN, add mirtazepine 15 mg at bedtime for sleep and mood 10/29: Poor sleep since removal of seroquel ; resume at 25 mg at bedtime 10/30: Overly sedated this a.m.  Reduce mirtazapine to 7.5 mg nightly.  Therapies adjusting for afternoon schedules 10-31: Again, increasingly lethargic into the later afternoon.  DC Seroquel , continue mirtazapine 7.5 mg nightly, change as needed to Benadryl  25 mg given patient reports of itching  sensation as he falls asleep without objective rashes. 11/1 reports slept better yesterday, doesn't appear that he got as needed Benadryl  11-4: Patient reports better sleep, monitor--doing well with current medications.  Behaviorally, engaging less with therapies, feels he is mostly self-limited and has little therapy buy-in at this point.  Mood, interaction is appropriate on my exam.   - Would consider increase in Lexapro , has history of severe headaches with sertraline so would not change agent.  Defer to outpatient since we just started mirtazapine 11/6: on chart review, Hx wellbutrin  - will d/w patient today - he endorses good mood, no SI/HI, simply tired of being in the hospital which is reasonable   5. Neuropsych/cognition: This patient may be intermittently capable of making decisions on their own behalf.    - 11-4: Consistently better in the afternoons in the late morning; consistent with reports from his wife.   - 11/6: Start aricept 5 mg at bedtime for memory decline--tolerating well  6. Skin/Wound Care: Routine pressure relief measures             -Stage 2 Sacral wound--wound care orders placed--Gerhardt's butt cream +Zinc  PO   7. Fluids/Electrolytes/Nutrition: Monitor intake and output.  CBC/CMET in a.m.             - SLP consult             -GERD: Continue PPI   -10-25: Albumin 2.4.  Encourage p.o. intakes.  - BMP ordered 10-29 due to orthostasis, possible hypovolemia--a.m. labs stable.  10/30: Cr increase to 1.1; give 1L IVF today; BMP in AM   10-31: BMP pending, encouraging p.o. fluids  11/2 rechecked BMP yesterday- Cr slightly down to 1.08, encourage oral fluids  11-3: Creatinine 0.8.  Resolved, monitor 11/6: cr back up, DC metformin , encourage PO--give 1 day off medication, repeat on 11-8;  may need IV fluid repletion  11-10: Labs stable for discharge  8.  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  Atorvastatin  80 mg daily.   9.   Acute metabolic encephalopathy: Secondary to aspiration pneumonia and hypotension.  Mentation improved continue to monitor.   10. Hypomagnesia: Mg 1.4<2.0<1.5< now 1.6 monitor and replace.    Repeat 11-8 11.  Bladder outlet obstruction: Chronic foley catheter last changed 10/18; 10/31             -continue CHG and foley catheter care.  - f/u with urology outpatient.    12.  HTN: resumed home Losartan  25 mg daily, monitor BP with increased activity for hypotension    - 10-26: BP slightly elevated this a.m., back down this afternoon.  Monitor    -10/27 BP controlled, overall, continue current regimen   10-28: Orthostatic hypotension with therapies, severe.  Hold losartan  for tomorrow AM.  Encourage p.o. fluids today.  - 10/29: Hypertensive overnight but orthostatic with therapies consistnetly 10/30:  give 1L IVF today, teds, binder   10-31: BP slightly improved, remains low out of bed with teds, patient asymptomatic.  Encouraging p.o. fluids, may need additional bolus   BP reviewed and is soft but not on anti-hypertensive agents      02/20/2024    4:50 AM 02/19/2024    7:40 PM 02/19/2024    3:22 PM  Vitals with BMI  Systolic 147 134 875  Diastolic 59 64 66  Pulse 60 65 80     13.  T2DM: A1c 8.5% on 10/7  -monitor cbgs ac/hs with Novolog  SSI and Lantus  15 units  -10-26: Blood sugars consistently elevated, increase Semglee  to 20 units daily -10/27 increase semglee  to 23 units - 10/28 appears semglee  was Dced?? Add back at 23 U -10-29: Low in a.m., apparent under coverage for meals.  Resume home metformin  500 mg twice daily.  May need more Premeal added. 10/30: BG better controlled with current regimen, monitor 11/4: BG elevated recently; increase Semglee  to 25 units 11-5: PM low of 74, monitor for recurrence today. 11/6: AKI; DC metformin , continue semglee  25 Increase semglee  to 26U--control improved Recent Labs    02/19/24 1652 02/19/24 2101 02/20/24 0559  GLUCAP 169* 170*  124*     14.CAD/HLD: continue Atorvastatin    15.  Severe sepsis due to aspiration RLL PNA: Initially treated with IV meropenem now transition to p.o. Augmentin  ends 10/24.  ID consulted, Dr. Fleeta Rothman recommended exchanging Foley due to fungemia.             - Aspiration precautions             - Palliative care consult: Patient remains DNR limited  - 10-31: Foley again exchanged, culture positive for less than 70,000 colonies of yeast, Foley exchange should have been sufficient.  Monitor.   16. Cholelithiasis: Maybe chronic, no current GI symptoms. Continue to monitor.    17.  Heart block: S/p PPM    - Heart rate well-controlled, monitor    02/20/2024    4:50 AM 02/19/2024    7:40 PM 02/19/2024    3:22 PM  Vitals with BMI  Systolic 147 134 875  Diastolic 59 64 66  Pulse 60 65 80    18.  Constipation/neurogenic bowel.  Last bowel movement 10-20.  Wife endorses after his last stroke, he was incontinent, improved after leaving hospital.  - Start Senokot S1 tab twice daily   - Miralax  PRN--10-26  add to daily 11-3: Bowel movement, continent.  Seems to be slowing down. 11-6: X 2 BM.  Reduce Senokot-S to 1 tablet nightly, change MiraLAX  to daily as needed 11/9: see #20 Last bowel movement 11-9  19. Itching -Reports after he was given chlorhexadine or similar type product?, discussed nursing, will not use this today.  Chlorhexidine  was discontinued -Benedryl prn, sarna prn--used twice on 11-2.  No obvious rash on exam. -Resolved 11-5  20. Hypomagnesmia: magnesium  level reviewed and is 1.3 on 11/8, supplement 2 grams IV, messaged nursing regarding date of last BM- if 11/6 as documented may benefit from magnesium  citrate  11-10: Add on magnesium  labs for repletion LOS: 17 days A FACE TO FACE EVALUATION WAS PERFORMED  John [name] 02/20/2024, 8:05 AM

## 2024-02-20 NOTE — Progress Notes (Signed)
 Inpatient Rehabilitation Care Coordinator Discharge Note   Patient Details  Name: John [name] MRN: [id number] Date of Birth: 07/14/1945   Discharge location: D/c to SNF- [facility]  Length of Stay: 16 days  Discharge activity level: Min A  Home/community participation: Limited  Patient response un:Yzjouy Literacy - How often do you need to have someone help you when you read instructions, pamphlets, or other written material from your doctor or pharmacy?: Rarely  Patient response un:Dnrpjo Isolation - How often do you feel lonely or isolated from those around you?: Never  Services provided included: MD, RD, PT, OT, SLP, RN, TR, CM, Pharmacy, Neuropsych, SW  Financial Services:  Field Se[employer]ologist Utilized: [employer]  Choices offered to/list presented to: patient and wife  Follow-up services arranged:  Other (Comment) (SNF)           Patient response to transportation need: Is the patient able to respond to transportation needs?: Yes In the past 12 months, has lack of transportation kept you from medical appointments or from getting medications?: No In the past 12 months, has lack of transportation kept you from meetings, work, or from getting things needed for daily living?: No   Patient/Family verbalized understanding of follow-up arrangements:  Yes  Individual responsible for coordination of the follow-up plan: contact pt wife  Confirmed correct DME delivered: John [name] 02/20/2024    Comments (or additional information):fam edu completed  Summary of Stay    Date/Time Discharge Planning CSW  02/14/24 0958 Pt wil d/ to home with his wife who is primary caregiver. He needs to be as independent as possible. SW will confirm there are no barriers to discharge. AAC  02/08/24 0857 pt wil d/ to home with his wife who is primary caregiver. He needs to be as independent as possible. SW will confirm there are no barriers to discharge. AAC        John [name]

## 2024-02-20 NOTE — Progress Notes (Signed)
 Patient ID: John [name], male   DOB: Aug 15, 1945, 78 y.o.   MRN: [id number]  SW spoke with [name]/Admissions at [facility], and repots pt will go to Rm #[id number]. Below information provided to assign nurse.   Rm#- [id number] Nurse Report [phone]     [name], MSW, LCSW Office: [phone] Cell: [phone] Fax: [phone]

## 2024-02-20 NOTE — Plan of Care (Signed)
  Problem: Consults Goal: RH GENERAL PATIENT EDUCATION Description: See Patient Education module for education specifics. Outcome: Progressing   Problem: RH BOWEL ELIMINATION Goal: RH STG MANAGE BOWEL WITH ASSISTANCE Description: STG Manage Bowel with  supervision Assistance. Outcome: Progressing   Problem: RH BLADDER ELIMINATION Goal: RH STG MANAGE BLADDER WITH ASSISTANCE Description: STG Manage Bladder With supervision Assistance Outcome: Progressing

## 2024-02-20 NOTE — Plan of Care (Signed)
  Problem: RH Car Transfers Goal: LTG Patient will perform car transfers with assist (PT) Description: LTG: Patient will perform car transfers with assistance (PT). Outcome: Adequate for Discharge Note: Pt to transition to SNF prior to DC home   Problem: RH Ambulation Goal: LTG Patient will ambulate in controlled environment (PT) Description: LTG: Patient will ambulate in a controlled environment, # of feet with assistance (PT). Outcome: Adequate for Discharge Note: Pt to transition to SNF prior to DC home Goal: LTG Patient will ambulate in home environment (PT) Description: LTG: Patient will ambulate in home environment, # of feet with assistance (PT). Outcome: Adequate for Discharge Note: Pt to transition to SNF prior to DC home   Problem: RH Balance Goal: LTG Patient will maintain dynamic sitting balance (PT) Description: LTG:  Patient will maintain dynamic sitting balance with assistance during mobility activities (PT) Outcome: Completed/Met Goal: LTG Patient will maintain dynamic standing balance (PT) Description: LTG:  Patient will maintain dynamic standing balance with assistance during mobility activities (PT) Outcome: Completed/Met   Problem: Sit to Stand Goal: LTG:  Patient will perform sit to stand with assistance level (PT) Description: LTG:  Patient will perform sit to stand with assistance level (PT) Outcome: Completed/Met   Problem: RH Bed Mobility Goal: LTG Patient will perform bed mobility with assist (PT) Description: LTG: Patient will perform bed mobility with assistance, with/without cues (PT). Outcome: Completed/Met   Problem: RH Bed to Chair Transfers Goal: LTG Patient will perform bed/chair transfers w/assist (PT) Description: LTG: Patient will perform bed to chair transfers with assistance (PT). Outcome: Completed/Met   Problem: RH Stairs Goal: LTG Patient will ambulate up and down stairs w/assist (PT) Description: LTG: Patient will ambulate up and  down # of stairs with assistance (PT) Outcome: Completed/Met

## 2024-02-20 NOTE — Progress Notes (Signed)
 Inpatient Rehabilitation Discharge Medication Review by a Pharmacist  A complete drug regimen review was completed for this patient to identify any potential clinically significant medication issues.  High Risk Drug Classes Is patient taking? Indication by Medication  Antipsychotic No   Anticoagulant No   Antibiotic No   Opioid No   Antiplatelet No   Hypoglycemics/insulin  Yes Insulin  - DM  Vasoactive Medication No   Chemotherapy No   Other Yes Atorvastatin  - HLD Escitalopram  - mood Mirtazapine - mood, appetite Donepezil - AD Pantoprazole  - Reflux Lidocaine  patch - pain     Type of Medication Issue Identified Description of Issue Recommendation(s)  Drug Interaction(s) (clinically significant)     Duplicate Therapy     Allergy     No Medication Administration End Date     Incorrect Dose     Additional Drug Therapy Needed     Significant med changes from prior encounter (inform family/care partners about these prior to discharge).    Other       Clinically significant medication issues were identified that warrant physician communication and completion of prescribed/recommended actions by midnight of the next day:  No  Name of provider notified for urgent issues identified:   Provider Method of Notification:     Pharmacist comments: None  Time spent performing this drug regimen review (minutes):  20 minutes   Perri Olam Murray 02/20/2024 11:18 AM

## 2024-02-20 NOTE — Discharge Instructions (Signed)
 Inpatient Rehab Discharge Instructions  [name] Discharge date and time:    Activities/Precautions/ Functional Status: Activity: no lifting, driving, or strenuous exercise till cleared by MD Diet: cardiac diet and diabetic diet Wound Care: none needed   Functional status:  ___ No restrictions     ___ Walk up steps independently _X__ 24/7 supervision/assistance   ___ Walk up steps with assistance ___ Intermittent supervision/assistance  ___ Bathe/dress independently ___ Walk with walker     __X_ Bathe/dress with assistance ___ Walk Independently    ___ Shower independently ___ Walk with assistance    ___ Shower with assistance _X__ No alcohol     ___ Return to work/school ________  Special Instructions:  STROKE/TIA DISCHARGE INSTRUCTIONS SMOKING Cigarette smoking nearly doubles your risk of having a stroke & is the single most alterable risk factor  If you smoke or have smoked in the last 12 months, you are advised to quit smoking for your health. Most of the excess cardiovascular risk related to smoking disappears within a year of stopping. Ask you doctor about anti-smoking medications [place] Quit Line: 1-800-QUIT NOW Free Smoking Cessation Classes [phone]  CHOLESTEROL Know your levels; limit fat & cholesterol in your diet  Lipid Panel     Component Value Date/Time   CHOL 81 01/19/2024 0127   TRIG 77 01/19/2024 0127   HDL 32 (L) 01/19/2024 0127   CHOLHDL 2.5 01/19/2024 0127   VLDL 15 01/19/2024 0127   LDLCALC 34 01/19/2024 0127     Many patients benefit from treatment even if their cholesterol is at goal. Goal: Total Cholesterol (CHOL) less than 160 Goal:  Triglycerides (TRIG) less than 150 Goal:  HDL greater than 40 Goal:  LDL (LDLCALC) less than 100   BLOOD PRESSURE American Stroke Association blood pressure target is less that 120/80 mm/Hg  Your discharge blood pressure is:  BP: (!) 147/59 Monitor your blood pressure Limit your salt and alcohol  intake Many individuals will require more than one medication for high blood pressure  DIABETES (A1c is a blood sugar average for last 3 months) Goal HGBA1c is under 7% (HBGA1c is blood sugar average for last 3 months)  Diabetes:     Lab Results  Component Value Date   HGBA1C 8.5 (H) 01/17/2024    Your HGBA1c can be lowered with medications, healthy diet, and exercise. Check your blood sugar as directed by your physician Call your physician if you experience unexplained or low blood sugars.  PHYSICAL ACTIVITY/REHABILITATION Goal is 30 minutes at least 4 days per week  Activity: No driving, Therapies: At SNF Return to work: N/A Activity decreases your risk of heart attack and stroke and makes your heart stronger.  It helps control your weight and blood pressure; helps you relax and can improve your mood. Participate in a regular exercise program. Talk with your doctor about the best form of exercise for you (dancing, walking, swimming, cycling).  DIET/WEIGHT Goal is to maintain a healthy weight  Your discharge diet is:  Diet Order             Diet regular Room service appropriate? Yes; Fluid consistency: Thin  Diet effective now                   liquids Your height is:  Height: 6' (182.9 cm) Your current weight is: Weight: 76.9 kg Your Body Mass Index (BMI) is:  BMI (Calculated): 22.99 Following the type of diet specifically designed for you will help prevent  another stroke. You are at goal weight    Your goal Body Mass Index (BMI) is 19-24. Healthy food habits can help reduce 3 risk factors for stroke:  High cholesterol, hypertension, and excess weight.  RESOURCES Stroke/Support Group:  Call [phone]   STROKE EDUCATION PROVIDED/REVIEWED AND GIVEN TO PATIENT Stroke warning signs and symptoms How to activate emergency medical system (call 911). Medications prescribed at discharge. Need for follow-up after discharge. Personal risk factors for stroke. Pneumonia vaccine  given:  Flu vaccine given:  My questions have been answered, the writing is legible, and I understand these instructions.  I will adhere to these goals & educational materials that have been provided to me after my discharge from the hospital.     My questions have been answered and I understand these instructions. I will adhere to these goals and the provided educational materials after my discharge from the hospital.  Patient/Caregiver Signature _______________________________ Date __________  Clinician Signature _______________________________________ Date __________  Please bring this form and your medication list with you to all your follow-up doctor's appointments.

## 2024-02-20 NOTE — Plan of Care (Signed)
  Problem: RH Cognition - SLP Goal: RH LTG Patient will demonstrate orientation with cues Description:  LTG:  Patient will demonstrate orientation to person/place/time/situation with cues (SLP)   Outcome: Completed/Met   Problem: RH Problem Solving Goal: LTG Patient will demonstrate problem solving for (SLP) Description: LTG:  Patient will demonstrate problem solving for basic/complex daily situations with cues  (SLP) Outcome: Completed/Met   Problem: RH Memory Goal: LTG Patient will use memory compensatory aids to (SLP) Description: LTG:  Patient will use memory compensatory aids to recall biographical/new, daily complex information with cues (SLP) Outcome: Completed/Met   Problem: RH Attention Goal: LTG Patient will demonstrate this level of attention during functional activites (SLP) Description: LTG:  Patient will will demonstrate this level of attention during functional activites (SLP) Outcome: Completed/Met

## 2024-02-21 ENCOUNTER — Telehealth: Payer: Self-pay | Admitting: Family Medicine

## 2024-02-21 NOTE — Telephone Encounter (Signed)
 Pts wife asked to be able to change his mychart password so she can take care of his medical needs. I gave her the username and sent a link to her email so she can do so, due to the AD.

## 2024-02-21 NOTE — Telephone Encounter (Signed)
 Noted

## 2024-02-21 NOTE — Plan of Care (Signed)
   Problem: RH Balance Goal: LTG Patient will maintain dynamic standing with ADLs (OT) Description: LTG:  Patient will maintain dynamic standing balance with assist during activities of daily living (OT)  Outcome: Completed/Met   Problem: Sit to Stand Goal: LTG:  Patient will perform sit to stand in prep for activites of daily living with assistance level (OT) Description: LTG:  Patient will perform sit to stand in prep for activites of daily living with assistance level (OT) Outcome: Completed/Met   Problem: RH Bathing Goal: LTG Patient will bathe all body parts with assist levels (OT) Description: LTG: Patient will bathe all body parts with assist levels (OT) Outcome: Completed/Met   Problem: RH Dressing Goal: LTG Patient will perform upper body dressing (OT) Description: LTG Patient will perform upper body dressing with assist, with/without cues (OT). Outcome: Completed/Met Goal: LTG Patient will perform lower body dressing w/assist (OT) Description: LTG: Patient will perform lower body dressing with assist, with/without cues in positioning using equipment (OT) Outcome: Completed/Met   Problem: RH Toileting Goal: LTG Patient will perform toileting task (3/3 steps) with assistance level (OT) Description: LTG: Patient will perform toileting task (3/3 steps) with assistance level (OT)  Outcome: Completed/Met   Problem: RH Toilet Transfers Goal: LTG Patient will perform toilet transfers w/assist (OT) Description: LTG: Patient will perform toilet transfers with assist, with/without cues using equipment (OT) Outcome: Completed/Met   Problem: RH Tub/Shower Transfers Goal: LTG Patient will perform tub/shower transfers w/assist (OT) Description: LTG: Patient will perform tub/shower transfers with assist, with/without cues using equipment (OT) Outcome: Completed/Met

## 2024-02-21 NOTE — Progress Notes (Signed)
 Occupational Therapy Discharge Summary  Patient Details  Name: John [name] MRN: [id number] Date of Birth: 1945/06/08  Date of Discharge from OT service:February 20, 2024   Patient has met 8 of 8 long term goals due to improved activity tolerance, improved balance, ability to compensate for deficits, and functional use of  LEFT upper and LEFT lower extremity.  Patient to discharge at overall Min-Mod A level. Patient to transition to SNF for continued therapeutic services.   Reasons goals not met: NA  Recommendation:  Patient transitioned to SNF for continued therapeutic support.   Equipment: No equipment provided  Reasons for discharge: lack of progress toward goals  Patient/family agrees with progress made and goals achieved: Yes  OT Discharge Precautions/Restrictions  Precautions Precautions: Fall Recall of Precautions/Restrictions: Impaired Precaution/Restrictions Comments: L hemiparesis Restrictions Weight Bearing Restrictions Per Provider Order: No ADL ADL Eating: Set up Where Assessed-Eating: Wheelchair Grooming: Setup Where Assessed-Grooming: Sitting at sink Upper Body Bathing: Setup, Supervision/safety Where Assessed-Upper Body Bathing: Sitting at sink Lower Body Bathing: Contact guard, Minimal assistance Where Assessed-Lower Body Bathing: Sitting at sink, Standing at sink Upper Body Dressing: Setup Where Assessed-Upper Body Dressing: Sitting at sink Lower Body Dressing: Moderate assistance Where Assessed-Lower Body Dressing: Sitting at sink, Standing at sink Toileting: Moderate assistance, Minimal assistance Where Assessed-Toileting: Bedside Commode, Toilet Toilet Transfer: Contact guard, Minimal assistance Toilet Transfer Method: Proofreader: Bedside commode, Grab bars Tub/Shower Transfer: Not assessed Film/video Editor: Contact guard, Minimal assistance Film/video Editor Method: Manufacturing Systems Engineer: Sales Promotion Account Executive Baseline Vision/History: 1 Wears glasses Wears Glasses: Reading only Patient Visual Report: No change from baseline Vision Assessment?: No apparent visual deficits Perception  Perception: Impaired Perception-Other Comments: Mild L-sided inattention. Praxis Praxis: WFL Cognition Cognition Overall Cognitive Status: Impaired/Different from baseline Arousal/Alertness: Awake/alert Memory: Impaired Memory Impairment: Decreased short term memory;Storage deficit;Decreased recall of new information Awareness: Impaired Awareness Impairment: Intellectual impairment Initiating: Impaired Initiating Impairment: Functional basic;Verbal basic Behaviors: Poor frustration tolerance Safety/Judgment: Impaired Sensation Sensation Light Touch: Appears Intact Hot/Cold: Appears Intact Proprioception: Appears Intact Coordination Gross Motor Movements are Fluid and Coordinated: No Fine Motor Movements are Fluid and Coordinated: No Coordination and Movement Description: Deficits due to L-sided hemiparesis and generalized debility. Motor  Motor Motor: Hemiplegia;Abnormal postural alignment and control Motor - Discharge Observations: L hemiparesis Mobility  Transfers Sit to Stand: Contact Guard/Touching assist;Minimal Assistance - Patient > 75% Stand to Sit: Contact Guard/Touching assist;Minimal Assistance - Patient > 75%  Trunk/Postural Assessment  Cervical Assessment Cervical Assessment: Exceptions to [facility] (forward head) Thoracic Assessment Thoracic Assessment: Exceptions to [facility] (rouned shoulders) Lumbar Assessment Lumbar Assessment: Exceptions to [facility] (PPT) Postural Control Postural Control: Deficits on evaluation Righting Reactions: Delayed  Balance Balance Balance Assessed: Yes Static Sitting Balance Static Sitting - Balance Support: Feet supported Static Sitting - Level of Assistance: 5: Stand by assistance (Supervision) Dynamic Sitting  Balance Dynamic Sitting - Balance Support: Feet supported Dynamic Sitting - Level of Assistance: 5: Stand by assistance;4: Min assist (CGA) Dynamic Sitting - Balance Activities: Reaching for objects Static Standing Balance Static Standing - Balance Support: During functional activity;Bilateral upper extremity supported Static Standing - Level of Assistance: 4: Min assist;5: Stand by assistance (CGA-Min A) Dynamic Standing Balance Dynamic Standing - Balance Support: Bilateral upper extremity supported;During functional activity Dynamic Standing - Level of Assistance: 4: Min assist Extremity/Trunk Assessment RUE Assessment RUE Assessment: Within Functional Limits General Strength Comments: 3+/5 LUE Assessment LUE Assessment: Exceptions to [facility] Active Range of Motion (AROM) Comments:  Decreased shoulder flexion/abdcution. LUE Body System: Neuro Brunstrum levels for arm and hand: Arm;Hand Brunstrum level for arm: Stage IV Movement is deviating from synergy Brunstrum level for hand: Stage V Independence from basic synergies  John [name], OTR/L, MSOT  02/21/2024, 1:03 PM

## 2024-02-22 ENCOUNTER — Other Ambulatory Visit (HOSPITAL_BASED_OUTPATIENT_CLINIC_OR_DEPARTMENT_OTHER): Payer: Self-pay

## 2024-02-22 ENCOUNTER — Ambulatory Visit: Payer: Medicare Other | Admitting: Podiatry

## 2024-02-22 DIAGNOSIS — R351 Nocturia: Secondary | ICD-10-CM | POA: Diagnosis not present

## 2024-02-22 DIAGNOSIS — R3912 Poor urinary stream: Secondary | ICD-10-CM | POA: Diagnosis not present

## 2024-02-22 DIAGNOSIS — N401 Enlarged prostate with lower urinary tract symptoms: Secondary | ICD-10-CM | POA: Diagnosis not present

## 2024-02-22 MED ORDER — FINASTERIDE 5 MG PO TABS
5.0000 mg | ORAL_TABLET | Freq: Every day | ORAL | 3 refills | Status: DC
  Filled 2024-02-22: qty 30, 30d supply, fill #0

## 2024-02-23 ENCOUNTER — Ambulatory Visit: Payer: Medicare Other | Admitting: Podiatry

## 2024-02-28 ENCOUNTER — Other Ambulatory Visit (HOSPITAL_BASED_OUTPATIENT_CLINIC_OR_DEPARTMENT_OTHER): Payer: Self-pay

## 2024-02-28 MED ORDER — FLUCONAZOLE 200 MG PO TABS
ORAL_TABLET | ORAL | 0 refills | Status: AC
  Filled 2024-02-28: qty 12, 5d supply, fill #0

## 2024-02-29 ENCOUNTER — Other Ambulatory Visit (HOSPITAL_BASED_OUTPATIENT_CLINIC_OR_DEPARTMENT_OTHER): Payer: Self-pay

## 2024-02-29 DIAGNOSIS — F331 Major depressive disorder, recurrent, moderate: Secondary | ICD-10-CM | POA: Diagnosis not present

## 2024-03-02 ENCOUNTER — Inpatient Hospital Stay: Admitting: Family Medicine

## 2024-03-02 ENCOUNTER — Other Ambulatory Visit: Payer: Self-pay

## 2024-03-02 DIAGNOSIS — I63511 Cerebral infarction due to unspecified occlusion or stenosis of right middle cerebral artery: Secondary | ICD-10-CM

## 2024-03-05 ENCOUNTER — Telehealth: Payer: Self-pay | Admitting: Family Medicine

## 2024-03-05 NOTE — Telephone Encounter (Signed)
 Copied from CRM [id number]. Topic: Referral - Question >> Mar 05, 2024 11:38 AM Hadassah PARAS wrote: Reason for CRM: Pt's wife, Boby #[id number], called in stating that the referal sent to  [facility] needs to be resent as urgent. They spoke with the office and advised pt it would take weeks before they can scan paperwork and be seen. Please follow up with wife [name].

## 2024-03-05 NOTE — Telephone Encounter (Signed)
 Can you resend neurology referral as urgent please?

## 2024-03-05 NOTE — Telephone Encounter (Signed)
 Spoke w/ [name]- informed that referral has been updated.

## 2024-03-06 ENCOUNTER — Ambulatory Visit: Admitting: Neurology

## 2024-03-12 ENCOUNTER — Telehealth: Payer: Self-pay

## 2024-03-12 NOTE — Transitions of Care (Post Inpatient/ED Visit) (Signed)
   03/12/2024  Name: [name] MRN: [id number] DOB: [birth year]  Today's TOC FU Call Status: Today's TOC FU Call Status:: Unsuccessful Call (1st Attempt) Unsuccessful Call (1st Attempt) Date: 03/12/24  Attempted to reach the patient regarding the most recent Inpatient/ED visit.  Follow Up Plan: Additional outreach attempts will be made to reach the patient to complete the Transitions of Care (Post Inpatient/ED visit) call.   Signature  Charmaine Bloodgood, LPN [facility] Health Advisor [place] [facility] You Are. We Are. One [facility] Direct Dial  [phone]

## 2024-03-12 NOTE — Transitions of Care (Post Inpatient/ED Visit) (Signed)
   03/12/2024  Name: John [name] MRN: [id number] DOB: 07-31-1945  Wife is asking if there is a wheelchair available at the office that she can use to get him from car into the office.  Would also like to know if there is a staff member that can possibly assist in getting him out of car.

## 2024-03-12 NOTE — Transitions of Care (Post Inpatient/ED Visit) (Signed)
 03/12/2024  Name: John [name] MRN: [id number] DOB: [birth year]  Today's TOC FU Call Status: Today's TOC FU Call Status:: Successful TOC FU Call Completed Unsuccessful Call (1st Attempt) Date: 03/12/24 [facility] FU Call Complete Date: 03/12/24  Patient's Name and Date of Birth confirmed. DOB, Name  Transition Care Management Follow-up Telephone Call Date of Discharge: 03/07/24 Discharge Facility: Other [facility]) Name of Other (Non[facility]) Discharge Facility: [facility] Type of Discharge: Inpatient Admission Primary Inpatient Discharge Diagnosis:: CVA How have you been since you were released from the hospital?: Better Any questions or concerns?: No  Items Reviewed: Did you receive and understand the discharge instructions provided?: Yes Medications obtained,verified, and reconciled?: Yes (Medications Reviewed) Any new allergies since your discharge?: No Dietary orders reviewed?: NA Do you have support at home?: Yes People in Home [RPT]: spouse  Medications Reviewed Today: Medications Reviewed Today     Reviewed by Lavelle Charmaine NOVAK, LPN (Licensed Practical Nurse) on 03/12/24 at 1017  Med List Status: <None>   Medication Order Taking? Sig Documenting Provider Last Dose Status Informant    Discontinued 06/30/23 1746     Discontinued 10/11/23 1505 (Patient Preference)   aspirin  EC 81 MG tablet [id number]  Take 1 tablet (81 mg total) by mouth daily. Swallow whole. Drusilla Sabas RAMAN, MD  Active   atorvastatin  (LIPITOR ) 80 MG tablet [id number] No Take 1 tablet (80 mg total) by mouth at bedtime. [name], DO 01/15/2024 Active Spouse/Significant Other, Pharmacy Records, Nursing Home Medication Administration Guide (MAG)  camphor-menthol  [facility]) lotion [id number]  Apply topically as needed for itching. Maurice Sharlet RAMAN, PA-C  Active   donepezil  (ARICEPT ) 5 MG tablet [id number]  Take 1 tablet (5 mg total) by mouth at bedtime. Maurice Sharlet RAMAN, PA-C  Active    escitalopram  (LEXAPRO ) 10 MG tablet [id number] No Take 1 tablet (10 mg total) by mouth daily. [name], DO 01/16/2024 Active Spouse/Significant Other, Pharmacy Records, Nursing Home Medication Administration Guide (MAG)  finasteride  (PROSCAR ) 5 MG tablet [id number]  Take 1 tablet (5 mg total) by mouth daily.   Active   insulin  aspart (NOVOLOG ) 100 UNIT/ML injection [id number]  Inject 3 Units into the skin 3 (three) times daily with meals. Drusilla Sabas RAMAN, MD  Active   insulin  glargine-yfgn (SEMGLEE ) 100 UNIT/ML injection [id number]  Inject 0.26 mLs (26 Units total) into the skin daily. Maurice Sharlet RAMAN, PA-C  Active   lidocaine  (LIDODERM ) 5 % [id number]  Place 2 patches onto the skin daily. Remove & Discard patch within 12 hours or as directed by MD Love, Sharlet RAMAN, PA-C  Active   magnesium  oxide (MAG-OX) 400 (240 Mg) MG tablet [id number]  Take 1 tablet (400 mg total) by mouth daily. Maurice Sharlet RAMAN, PA-C  Active   mirtazapine  (REMERON ) 7.5 MG tablet [id number]  Take 1 tablet (7.5 mg total) by mouth at bedtime. Maurice Sharlet RAMAN, PA-C  Active   pantoprazole  (PROTONIX ) 40 MG tablet [id number] No Take 1 tablet (40 mg total) by mouth at bedtime. [name], DO 01/15/2024 Active Spouse/Significant Other, Pharmacy Records, Nursing Home Medication Administration Guide (MAG)  polyethylene glycol powder (GLYCOLAX /MIRALAX ) 17 GM/SCOOP powder [id number]  Take 17 g by mouth daily as needed. Dissolve 1 capful (17g) in 4-8 ounces of liquid and take by mouth daily. Love, Pamela S, PA-C  Active   senna-docusate (SENOKOT-S) 8.6-50 MG tablet [id number]  Take 1 tablet by mouth at bedtime. Maurice Sharlet RAMAN, PA-C  Active  Home Care and Equipment/Supplies: Were Home Health Services Ordered?: Yes Any new equipment or medical supplies ordered?: NA  Functional Questionnaire: Do you need assistance with bathing/showering or dressing?: Yes Do you need assistance with meal preparation?: Yes Do you  need assistance with eating?: No Do you have difficulty maintaining continence: No Do you need assistance with getting out of bed/getting out of a chair/moving?: Yes Do you have difficulty managing or taking your medications?: No  Follow up appointments reviewed: PCP Follow-up appointment confirmed?: Yes Date of PCP follow-up appointment?: 03/16/24 Follow-up Provider: Dr. Frann Specialist [facility] Follow-up appointment confirmed?: Yes Date of Specialist follow-up appointment?: 04/18/24 Follow-Up Specialty Provider:: Neurology Do you need transportation to your follow-up appointment?: No Do you understand care options if your condition(s) worsen?: Yes-patient verbalized understanding    SIGNATURE Charmaine Bloodgood, LPN [facility] Health Advisor [place] [facility] Medical Group You Are. We Are. One [facility] Direct Dial  [phone]

## 2024-03-13 ENCOUNTER — Ambulatory Visit: Admitting: Family Medicine

## 2024-03-13 ENCOUNTER — Telehealth: Payer: Self-pay

## 2024-03-13 NOTE — Telephone Encounter (Signed)
 Copied from CRM #[id number]. Topic: Clinical - Home Health Verbal Orders >> Mar 13, 2024  9:46 AM Eva FALCON wrote: Caller/Agency: [facility] Callback Number: [phone] Service Requested: Physical Therapy Frequency: twice a week for 1 week, once a week for 3 weeks, new years week no visit, once a week for 2 weeks.  Any new concerns about the patient? No

## 2024-03-13 NOTE — Telephone Encounter (Signed)
 Copied from CRM #[id number]. Topic: Clinical - Home Health Verbal Orders >> Mar 13, 2024  9:49 AM Eva FALCON wrote: Caller/Agency: [facility] Callback Number: [phone] Service Requested: Home Health Aid to help with bed baths. Frequency: once a week for 8 weeks.  Any new concerns about the patient? No

## 2024-03-13 NOTE — Telephone Encounter (Signed)
 Spoke Cecelia- verbal orders given for [facility] aide and PT

## 2024-03-14 ENCOUNTER — Other Ambulatory Visit: Payer: Self-pay

## 2024-03-14 ENCOUNTER — Emergency Department (HOSPITAL_COMMUNITY): Admission: EM | Admit: 2024-03-14 | Discharge: 2024-03-20 | Disposition: A | Discharge status: Skilled Nursing Facility

## 2024-03-14 ENCOUNTER — Telehealth: Payer: Self-pay | Admitting: *Deleted

## 2024-03-14 ENCOUNTER — Telehealth: Payer: Self-pay | Admitting: Family Medicine

## 2024-03-14 ENCOUNTER — Other Ambulatory Visit (HOSPITAL_BASED_OUTPATIENT_CLINIC_OR_DEPARTMENT_OTHER): Payer: Self-pay

## 2024-03-14 ENCOUNTER — Emergency Department (HOSPITAL_COMMUNITY)

## 2024-03-14 DIAGNOSIS — F4321 Adjustment disorder with depressed mood: Secondary | ICD-10-CM

## 2024-03-14 DIAGNOSIS — Z043 Encounter for examination and observation following other accident: Secondary | ICD-10-CM | POA: Diagnosis not present

## 2024-03-14 DIAGNOSIS — M25562 Pain in left knee: Secondary | ICD-10-CM | POA: Diagnosis not present

## 2024-03-14 DIAGNOSIS — Y92129 Unspecified place in nursing home as the place of occurrence of the external cause: Secondary | ICD-10-CM | POA: Diagnosis not present

## 2024-03-14 DIAGNOSIS — Z7982 Long term (current) use of aspirin: Secondary | ICD-10-CM | POA: Diagnosis not present

## 2024-03-14 DIAGNOSIS — I10 Essential (primary) hypertension: Secondary | ICD-10-CM | POA: Diagnosis not present

## 2024-03-14 DIAGNOSIS — S8992XA Unspecified injury of left lower leg, initial encounter: Secondary | ICD-10-CM | POA: Diagnosis not present

## 2024-03-14 DIAGNOSIS — W19XXXA Unspecified fall, initial encounter: Secondary | ICD-10-CM

## 2024-03-14 DIAGNOSIS — E1165 Type 2 diabetes mellitus with hyperglycemia: Secondary | ICD-10-CM | POA: Diagnosis not present

## 2024-03-14 DIAGNOSIS — Z7901 Long term (current) use of anticoagulants: Secondary | ICD-10-CM | POA: Diagnosis not present

## 2024-03-14 DIAGNOSIS — M19012 Primary osteoarthritis, left shoulder: Secondary | ICD-10-CM | POA: Diagnosis not present

## 2024-03-14 DIAGNOSIS — W06XXXA Fall from bed, initial encounter: Secondary | ICD-10-CM | POA: Diagnosis not present

## 2024-03-14 DIAGNOSIS — M25512 Pain in left shoulder: Secondary | ICD-10-CM | POA: Diagnosis not present

## 2024-03-14 DIAGNOSIS — Z79899 Other long term (current) drug therapy: Secondary | ICD-10-CM | POA: Diagnosis not present

## 2024-03-14 DIAGNOSIS — S81012A Laceration without foreign body, left knee, initial encounter: Secondary | ICD-10-CM | POA: Diagnosis not present

## 2024-03-14 DIAGNOSIS — Z794 Long term (current) use of insulin: Secondary | ICD-10-CM | POA: Diagnosis not present

## 2024-03-14 LAB — CBC WITH DIFFERENTIAL/PLATELET
Abs Immature Granulocytes: 0.05 K/uL (ref 0.00–0.07)
Basophils Absolute: 0.1 K/uL (ref 0.0–0.1)
Basophils Relative: 1 %
Eosinophils Absolute: 0.2 K/uL (ref 0.0–0.5)
Eosinophils Relative: 2 %
HCT: 34.5 % — ABNORMAL LOW (ref 39.0–52.0)
Hemoglobin: 11 g/dL — ABNORMAL LOW (ref 13.0–17.0)
Immature Granulocytes: 1 %
Lymphocytes Relative: 29 %
Lymphs Abs: 2.5 K/uL (ref 0.7–4.0)
MCH: 28.8 pg (ref 26.0–34.0)
MCHC: 31.9 g/dL (ref 30.0–36.0)
MCV: 90.3 fL (ref 80.0–100.0)
Monocytes Absolute: 0.6 K/uL (ref 0.1–1.0)
Monocytes Relative: 8 %
Neutro Abs: 5 K/uL (ref 1.7–7.7)
Neutrophils Relative %: 59 %
Platelets: 304 K/uL (ref 150–400)
RBC: 3.82 MIL/uL — ABNORMAL LOW (ref 4.22–5.81)
RDW: 14.7 % (ref 11.5–15.5)
WBC: 8.5 K/uL (ref 4.0–10.5)
nRBC: 0 % (ref 0.0–0.2)

## 2024-03-14 LAB — BASIC METABOLIC PANEL WITH GFR
Anion gap: 6 (ref 5–15)
BUN: 14 mg/dL (ref 8–23)
CO2: 27 mmol/L (ref 22–32)
Calcium: 8.2 mg/dL — ABNORMAL LOW (ref 8.9–10.3)
Chloride: 102 mmol/L (ref 98–111)
Creatinine, Ser: 0.97 mg/dL (ref 0.61–1.24)
GFR, Estimated: >60 mL/min (ref >60)
Glucose, Bld: 355 mg/dL — ABNORMAL HIGH (ref 70–99)
Potassium: 3.7 mmol/L (ref 3.5–5.1)
Sodium: 135 mmol/L (ref 135–145)

## 2024-03-14 LAB — URINALYSIS, ROUTINE W REFLEX MICROSCOPIC
Bacteria, UA: NONE SEEN
Bilirubin Urine: NEGATIVE
Glucose, UA: >=500 mg/dL — AB
Ketones, ur: NEGATIVE mg/dL
Nitrite: NEGATIVE
Protein, ur: 30 mg/dL — AB
Specific Gravity, Urine: 1.025 (ref 1.005–1.030)
pH: 6 (ref 5.0–8.0)

## 2024-03-14 MED ORDER — INSULIN GLARGINE-YFGN 100 UNIT/ML ~~LOC~~ SOLN
26.0000 [IU] | Freq: Every day | SUBCUTANEOUS | Status: DC
  Administered 2024-03-15 – 2024-03-16 (×2): 26 [IU] via SUBCUTANEOUS
  Filled 2024-03-14 (×3): qty 0.26

## 2024-03-14 MED ORDER — FINASTERIDE 5 MG PO TABS
5.0000 mg | ORAL_TABLET | Freq: Every day | ORAL | Status: DC
  Administered 2024-03-15 – 2024-03-20 (×6): 5 mg via ORAL
  Filled 2024-03-14 (×6): qty 1

## 2024-03-14 MED ORDER — ASPIRIN 81 MG PO TBEC
81.0000 mg | DELAYED_RELEASE_TABLET | Freq: Every day | ORAL | Status: DC
  Administered 2024-03-15 – 2024-03-20 (×6): 81 mg via ORAL
  Filled 2024-03-14 (×6): qty 1

## 2024-03-14 MED ORDER — MAGNESIUM OXIDE -MG SUPPLEMENT 400 (240 MG) MG PO TABS
400.0000 mg | ORAL_TABLET | Freq: Every day | ORAL | Status: DC
  Administered 2024-03-15 – 2024-03-20 (×6): 400 mg via ORAL
  Filled 2024-03-14 (×6): qty 1

## 2024-03-14 MED ORDER — ESCITALOPRAM OXALATE 10 MG PO TABS
10.0000 mg | ORAL_TABLET | Freq: Every day | ORAL | 1 refills | Status: AC
  Filled 2024-03-14: qty 90, 90d supply, fill #0

## 2024-03-14 MED ORDER — PANTOPRAZOLE SODIUM 40 MG PO TBEC
40.0000 mg | DELAYED_RELEASE_TABLET | Freq: Every day | ORAL | Status: DC
  Administered 2024-03-15 – 2024-03-19 (×6): 40 mg via ORAL
  Filled 2024-03-14 (×6): qty 1

## 2024-03-14 MED ORDER — PANTOPRAZOLE SODIUM 40 MG PO TBEC
40.0000 mg | DELAYED_RELEASE_TABLET | Freq: Every day | ORAL | 0 refills | Status: AC
  Filled 2024-03-14: qty 30, 30d supply, fill #0

## 2024-03-14 MED ORDER — MAGNESIUM OXIDE -MG SUPPLEMENT 400 (240 MG) MG PO TABS
400.0000 mg | ORAL_TABLET | Freq: Every day | ORAL | 1 refills | Status: DC
  Filled 2024-03-14: qty 90, 90d supply, fill #0

## 2024-03-14 MED ORDER — INSULIN ASPART 100 UNIT/ML IJ SOLN
3.0000 [IU] | Freq: Three times a day (TID) | INTRAMUSCULAR | Status: DC
  Administered 2024-03-15 – 2024-03-16 (×4): 3 [IU] via SUBCUTANEOUS
  Filled 2024-03-14 (×4): qty 3

## 2024-03-14 MED ORDER — DONEPEZIL HCL 5 MG PO TABS
5.0000 mg | ORAL_TABLET | Freq: Every day | ORAL | Status: DC
  Administered 2024-03-15 – 2024-03-19 (×6): 5 mg via ORAL
  Filled 2024-03-14 (×6): qty 1

## 2024-03-14 MED ORDER — MIRTAZAPINE 15 MG PO TABS
7.5000 mg | ORAL_TABLET | Freq: Every day | ORAL | Status: DC
  Administered 2024-03-15 – 2024-03-19 (×6): 7.5 mg via ORAL
  Filled 2024-03-14 (×6): qty 1

## 2024-03-14 MED ORDER — SENNOSIDES-DOCUSATE SODIUM 8.6-50 MG PO TABS
1.0000 | ORAL_TABLET | Freq: Every day | ORAL | Status: DC
  Administered 2024-03-15 – 2024-03-18 (×3): 1 via ORAL
  Filled 2024-03-14 (×5): qty 1

## 2024-03-14 MED ORDER — ATORVASTATIN CALCIUM 80 MG PO TABS
80.0000 mg | ORAL_TABLET | Freq: Every day | ORAL | Status: DC
  Administered 2024-03-15 – 2024-03-19 (×6): 80 mg via ORAL
  Filled 2024-03-14 (×4): qty 1
  Filled 2024-03-14: qty 2
  Filled 2024-03-14: qty 1

## 2024-03-14 MED ORDER — DONEPEZIL HCL 5 MG PO TABS
5.0000 mg | ORAL_TABLET | Freq: Every day | ORAL | 1 refills | Status: AC
  Filled 2024-03-14: qty 90, 90d supply, fill #0

## 2024-03-14 MED ORDER — MIRTAZAPINE 7.5 MG PO TABS
7.5000 mg | ORAL_TABLET | Freq: Every day | ORAL | 1 refills | Status: AC
  Filled 2024-03-14: qty 90, 90d supply, fill #0

## 2024-03-14 NOTE — Telephone Encounter (Signed)
 Copied from CRM #[id number]. Topic: Appointments - Appointment Scheduling >> Mar 14, 2024 11:59 AM Viola F wrote: Patient cannot log into MyChart and he has a virtual visit Friday 12/5 - spouse [name] wants to know if there's a way that the video link could be sent to her so patient can join the virtual visit? Please call her with an update at [phone]

## 2024-03-14 NOTE — ED Provider Notes (Signed)
 John [name] EMERGENCY DEPARTMENT AT [facility] Provider Note   CSN: [id number] Arrival date & time: 03/14/24  2037     Patient presents with: John [name] is a 78 y.o. male.  78 year old male presents to ED via EMS with complaints of mechanical fall tonight.  Patient reports he was on the bed with his stomach down and hips off of the bed so his wife could clean his buttocks.  His left leg gave out and he landed on his left knee and then proceeded to land on his shoulder.  He then rolled to his back.  He reported he did not hit his head and did not lose consciousness and the pain quickly subsided.  He reports his wife called EMS to send him to the hospital for evaluation. patient reports mild pain on his left shoulder and left knee.  Patient denies shortness of breath, chest pain, dizziness, headache, visual disturbances.  He recently had a stroke and was placed on Plavix  with some deficits in his left leg.  Patient has significant history of diabetes mellitus, stroke, BPH, hypertension.   Per EMS wife wants patient to be consulted for skilled nursing facility.     Prior to Admission medications   Medication Sig Start Date End Date Taking? Authorizing Provider  aspirin  EC 81 MG tablet Take 1 tablet (81 mg total) by mouth daily. Swallow whole. 01/25/24   Drusilla Sabas RAMAN, MD  atorvastatin  (LIPITOR ) 80 MG tablet Take 1 tablet (80 mg total) by mouth at bedtime. 09/14/23   [name], DO  camphor-menthol  [facility] lotion Apply topically as needed for itching. 02/20/24   Love, Sharlet RAMAN, PA-C  donepezil  (ARICEPT ) 5 MG tablet Take 1 tablet (5 mg total) by mouth at bedtime. 03/14/24   [name], DO  escitalopram  (LEXAPRO ) 10 MG tablet Take 1 tablet (10 mg total) by mouth daily. 03/14/24   [name], DO  finasteride  (PROSCAR ) 5 MG tablet Take 1 tablet (5 mg total) by mouth daily. 02/22/24     insulin  aspart (NOVOLOG ) 100 UNIT/ML injection Inject 3  Units into the skin 3 (three) times daily with meals. 01/24/24   Drusilla Sabas RAMAN, MD  insulin  glargine-yfgn (SEMGLEE ) 100 UNIT/ML injection Inject 0.26 mLs (26 Units total) into the skin daily. 02/21/24   Love, Sharlet RAMAN, PA-C  lidocaine  (LIDODERM ) 5 % Place 2 patches onto the skin daily. Remove & Discard patch within 12 hours or as directed by MD 02/20/24   Love, Sharlet RAMAN, PA-C  magnesium  oxide (MAG-OX) 400 (240 Mg) MG tablet Take 1 tablet (400 mg total) by mouth daily. 03/14/24   [name], DO  mirtazapine  (REMERON ) 7.5 MG tablet Take 1 tablet (7.5 mg total) by mouth at bedtime. 03/14/24   [name], DO  pantoprazole  (PROTONIX ) 40 MG tablet Take 1 tablet (40 mg total) by mouth at bedtime. 03/14/24   [name], DO  polyethylene glycol powder (GLYCOLAX /MIRALAX ) 17 GM/SCOOP powder Take 17 g by mouth daily as needed. Dissolve 1 capful (17g) in 4-8 ounces of liquid and take by mouth daily. 02/20/24   Love, Sharlet RAMAN, PA-C  senna-docusate (SENOKOT-S) 8.6-50 MG tablet Take 1 tablet by mouth at bedtime. 02/20/24   Love, Sharlet RAMAN, PA-C  amLODipine  (NORVASC ) 2.5 MG tablet Take 1 tablet (2.5 mg total) by mouth daily. 06/15/23 06/30/23  [name], DO  apixaban  (ELIQUIS ) 5 MG TABS tablet Take 1 tablet (5 mg total) by mouth  2 (two) times daily. 10/11/23 10/11/23  [name], DO    Allergies: Ramipril, Chlorhexidine , Adhesive [tape], Other, and Sertraline    Review of Systems  Musculoskeletal:  Positive for arthralgias and myalgias.  All other systems reviewed and are negative.   Updated Vital Signs BP (!) 162/67   Pulse 65   Temp 98.2 F (36.8 C) (Oral)   Resp 15   SpO2 98%   Physical Exam Vitals and nursing note reviewed.  Constitutional:      Appearance: Normal appearance.  HENT:     Head: Normocephalic and atraumatic.     Nose: Nose normal.  Eyes:     Extraocular Movements: Extraocular movements intact.     Conjunctiva/sclera:  Conjunctivae normal.     Pupils: Pupils are equal, round, and reactive to light.     Comments: Pupils equal and reactive bilaterally.  No pain with EOM patient denies any visual disturbances.  Neck:     Comments: Patient has full range of motion of his neck and no pain to palpation down C-spine or T-spine. Cardiovascular:     Rate and Rhythm: Normal rate.  Pulmonary:     Effort: Pulmonary effort is normal. No respiratory distress.     Breath sounds: Normal breath sounds.     Comments: Lungs clear to auscultation in all fields. Musculoskeletal:        General: Normal range of motion.     Cervical back: Normal range of motion.     Comments: Patient has some mild tenderness to left knee without any signs of dislocation.  Patient has full range of motion of his knee ankle and feet.  Patient has good cap refill, good sensation, strong pulses.  Patient has mild pain to palpation of his left shoulder as well.  Full sensation, pulses strong, good cap refill in bilateral upper extremities.  Patient also has full range of motion of shoulder and knee.  Skin:    General: Skin is warm.     Capillary Refill: Capillary refill takes less than 2 seconds.  Neurological:     General: No focal deficit present.     Mental Status: He is alert.     Comments: Patient has some residual deficits from previous stroke in his left leg and left arm with mild decrease strength.  Patient denies any new symptoms.  Psychiatric:        Mood and Affect: Mood normal.        Behavior: Behavior normal.     (all labs ordered are listed, but only abnormal results are displayed) Labs Reviewed  URINALYSIS, ROUTINE W REFLEX MICROSCOPIC - Abnormal; Notable for the following components:      Result Value   APPearance HAZY (*)    Glucose, UA >=500 (*)    Hgb urine dipstick SMALL (*)    Protein, ur 30 (*)    Leukocytes,Ua TRACE (*)    All other components within normal limits  CBC WITH DIFFERENTIAL/PLATELET - Abnormal;  Notable for the following components:   RBC 3.82 (*)    Hemoglobin 11.0 (*)    HCT 34.5 (*)    All other components within normal limits  BASIC METABOLIC PANEL WITH GFR - Abnormal; Notable for the following components:   Glucose, Bld 355 (*)    Calcium  8.2 (*)    All other components within normal limits    EKG: None  Radiology: DG Shoulder Left Result Date: 03/14/2024 EXAM: 1 VIEW(S) XRAY OF THE LEFT SHOULDER 03/14/2024  10:14:00 PM COMPARISON: None available. CLINICAL HISTORY: fall FINDINGS: BONES AND JOINTS: Mild osteoarthritis of the glenohumeral joint. Mild osteoarthritis of the acromioclavicular joint. No acute fracture or dislocation. SOFT TISSUES: Left chest wall cardiac device in place. No abnormal calcifications. Visualized lung is unremarkable. IMPRESSION: 1. No acute fracture or dislocation. Electronically signed by: Morgane Naveau MD 03/14/2024 10:45 PM EST RP Workstation: [id number]   DG Knee Complete 4 Views Left Result Date: 03/14/2024 CLINICAL DATA:  Fall EXAM: LEFT KNEE - COMPLETE 4+ VIEW COMPARISON:  None Available. FINDINGS: No evidence of fracture, dislocation, or joint effusion. No evidence of arthropathy or other focal bone abnormality. There are peripheral vascular calcifications. Soft tissues are otherwise within normal limits. IMPRESSION: 1. No acute fracture or dislocation. 2. Peripheral vascular disease. Electronically Signed   By: Greig Pique M.D.   On: 03/14/2024 22:34     Procedures   Medications Ordered in the ED  aspirin  EC tablet 81 mg (has no administration in time range)  atorvastatin  (LIPITOR ) tablet 80 mg (has no administration in time range)  donepezil  (ARICEPT ) tablet 5 mg (has no administration in time range)  finasteride  (PROSCAR ) tablet 5 mg (has no administration in time range)  insulin  aspart (novoLOG ) injection 3 Units (has no administration in time range)  insulin  glargine-yfgn (SEMGLEE ) injection 26 Units (has no administration in time  range)  magnesium  oxide (MAG-OX) tablet 400 mg (has no administration in time range)  mirtazapine  (REMERON ) tablet 7.5 mg (has no administration in time range)  pantoprazole  (PROTONIX ) EC tablet 40 mg (has no administration in time range)  senna-docusate (Senokot-S) tablet 1 tablet (has no administration in time range)    78 y.o. male presents to the ED with complaints of mechanical fall with left shoulder and left knee pain,  The differential diagnosis includes but not limited to fracture, dislocation, musculoskeletal injury, (Ddx)  On arrival pt is nontoxic, vitals unremarkable. Exam significant for small skin tear over left knee and mild pain to palpation to left knee and left shoulder.   Lab Tests:  I Ordered, reviewed, and interpreted labs, which included: BMP CBC UA Hemoglobin is at baseline for patient, BMP remarkable for elevated glucose with history of diabetes.  UA remarkable for elevated glucose trace leukocytes with 6-10 squamous.  Patient denies any urinary symptoms.  He does have a catheter.   Imaging Studies ordered:  I ordered imaging studies which included left shoulder and left knee x-ray.  Imaging unremarkable for acute findings.  ED Course:   Patient is sitting comfortably in ED bed in no acute distress nontoxic-appearing upon initial evaluation.  Patient is reporting that he has some mild pain and discomfort.  X-rays will be ordered to evaluate for any fractures.  There is no bruising and patient has good range of motion on initial evaluation.  X-ray was unremarkable for any acute findings.  On reevaluation patient reports his wife screamed she is done after he fell this evening.  He reports that she is requesting consult for skilled nursing facility.  He reports he just got out of skilled nursing facility after rehab from stroke.  He reports he will do what ever makes his wife happy and he is okay with consult for skilled nursing placement.  At home meds were ordered  and consult was placed for transition of care.  Portions of this note were generated with Scientist, clinical (histocompatibility and immunogenetics). Dictation errors may occur despite best attempts at proofreading.   Final diagnoses:  Fall, initial encounter  ED Discharge Orders     None          Myriam Fonda RAMAN, [place] 03/15/24 0053    Neysa Caron PARAS, DO 03/15/24 1457

## 2024-03-14 NOTE — Telephone Encounter (Signed)
 Sent pt wife message letting know it was sent.

## 2024-03-14 NOTE — Telephone Encounter (Unsigned)
 Copied from CRM [id number]. Topic: Referral - Question >> Mar 13, 2024  4:57 PM Tysheama G wrote: Reason for CRM: Patient wife is calling regarding patients referral. She stated that they got a new fax number so she want to make sure Dr.Wendling is sending it to the correct fax which is [phone] for [facility] location. And she asked can you mark it urgent please.

## 2024-03-14 NOTE — Telephone Encounter (Signed)
 Called spoke with pt wife and when have phone number set for video visit. Medication has been sent.

## 2024-03-14 NOTE — Telephone Encounter (Unsigned)
 Copied from CRM [id number]. Topic: Clinical - Medication Question >> Mar 14, 2024  4:01 PM Viola F wrote: Reason for CRM: Patient spouse Boby called regarding virtual visit scheduled for 03/16/24 - she cannot bring him in due to possible snow on Friday and wants to know if it's okay to reschedule to next week? (He has no phone to log into MyChart portal for virtual visit) In the meantime she wants to know if patient needs to be seen in order to get refills for medication in previous crm - he will be running out of medications on Saturday. Please call her at [phone]

## 2024-03-14 NOTE — Telephone Encounter (Unsigned)
 Copied from CRM [id number]. Topic: Clinical - Medication Refill >> Mar 14, 2024  3:56 PM Viola F wrote: Medication:  Magnesium  oxide (MAG-OX) 400 (240 Mg) MG tablet [[id number]] Escitalopram  (LEXAPRO ) 10 MG tablet [[id number]] Ondansetron  (ZOFRAN -ODT) 4 MG disintegrating tablet [[id number]]  DISCONTINUED Donepezil  (ARICEPT ) 5 MG tablet [[id number]] Mirtazapine  (REMERON ) 7.5 MG tablet [[id number]] Pantoprazole  (PROTONIX ) 40 MG tablet [[id number]]  Has the patient contacted their pharmacy? Yes (Agent: If no, request that the patient contact the pharmacy for the refill. If patient does not wish to contact the pharmacy document the reason why and proceed with request.) (Agent: If yes, when and what did the pharmacy advise?)  This is the patient's preferred pharmacy:   [facility] [address], [address] [place] [address] Phone: [phone] Fax: [phone]   Is this the correct pharmacy for this prescription? Yes If no, delete pharmacy and type the correct one.   Has the prescription been filled recently? Yes  Is the patient out of the medication? No  Has the patient been seen for an appointment in the last year OR does the patient have an upcoming appointment? Yes  Can we respond through MyChart? Yes  Agent: Please be advised that Rx refills may take up to 3 business days. We ask that you follow-up with your pharmacy.

## 2024-03-14 NOTE — ED Triage Notes (Signed)
 Pt BIB [facility] after a mechanical fall. Pt has deficits from prior strokes and ambulates with rolling walker assistance and states that his wife was assisting him into bed and his knee buckled and he fell on left side. Pt reports left knee and lkeft shoulder pain immediately after fall but this pain has subsided. EMS reports that pt's wife states that she is unable to care for pt at home alone and would like social work assistance for assisted living placement. She cannot drive at night but wishes to be called with any questions and/or updates. Pt is alert and oriented x 4.

## 2024-03-15 ENCOUNTER — Other Ambulatory Visit (HOSPITAL_BASED_OUTPATIENT_CLINIC_OR_DEPARTMENT_OTHER): Payer: Self-pay

## 2024-03-15 LAB — CBG MONITORING, ED
Glucose-Capillary: 170 mg/dL — ABNORMAL HIGH (ref 70–99)
Glucose-Capillary: 307 mg/dL — ABNORMAL HIGH (ref 70–99)
Glucose-Capillary: 229 mg/dL — ABNORMAL HIGH (ref 70–99)

## 2024-03-15 NOTE — Evaluation (Signed)
 Physical Therapy Evaluation Patient Details Name: John [name] MRN: [id number] DOB: 08-15-1945 Today's Date: 03/15/2024  History of Present Illness  Pt is 78 yo [facility] due to a fall.John [name] PMHx: 10/6-10/14 admission for Lt weakness with Rt pontine infarct. T12 fx, HTN, HLD, T2DM, CVA, cognitive deficits, CAD, heart block s/p PPM, HFrEF, DVT, BPH, urinary retention with Foley catheter  Clinical Impression  Pt is presenting at Max to mod A for bed mobility, Total A to get fully to standing getting partially to standing at Max A with RW. Pt was unable to progress gait due to poor balance and weakness. Spouse present during session and discussed with pt and spouse the need for possible longer tem plans due to recent hospitalizations and rehab stays. Due to pt current functional status, home set up and available assistance at home recommending skilled physical therapy services < 3 hours/day in order to address strength, balance and functional mobility to decrease risk for falls, injury, immobility, skin break down and re-hospitalization.         If plan is discharge home, recommend the following: Two people to help with walking and/or transfers;Assistance with cooking/housework;Assist for transportation;Help with stairs or ramp for entrance   Can travel by private vehicle   No    Equipment Recommendations Hoyer lift;Wheelchair (measurements PT);Wheelchair cushion (measurements PT)     Functional Status Assessment Patient has had a recent decline in their functional status and demonstrates the ability to make significant improvements in function in a reasonable and predictable amount of time.     Precautions / Restrictions Precautions Precautions: Fall Recall of Precautions/Restrictions: Impaired Restrictions Weight Bearing Restrictions Per Provider Order: No      Mobility  Bed Mobility Overal bed mobility: Needs Assistance Bed Mobility: Rolling, Supine to Sit, Sit to  Supine Rolling: Mod assist   Supine to sit: Max assist Sit to supine: Mod assist   General bed mobility comments: mod A for rolling R/L with cues for sequencing and Max A for trunk to midline, Mod a for LE to bed with mutli modal cues for sequencing.    Transfers Overall transfer level: Needs assistance Equipment used: Rolling walker (2 wheels) Transfers: Sit to/from Stand Sit to Stand: Max assist           General transfer comment: Max A for partial stand ~75% and then Max A for 90% stand at EOB with EOB elevated and R foot blocked.    Ambulation/Gait   General Gait Details: unable at this time.     Balance Overall balance assessment: Needs assistance Sitting-balance support: Bilateral upper extremity supported, Single extremity supported, Feet supported Sitting balance-Leahy Scale: Poor Sitting balance - Comments: Min A initially to supervision Postural control: Posterior lean, Left lateral lean Standing balance support: Bilateral upper extremity supported, During functional activity, Reliant on assistive device for balance Standing balance-Leahy Scale: Zero Standing balance comment: reliant on external support       Pertinent Vitals/Pain Pain Assessment Pain Assessment: Faces Faces Pain Scale: Hurts little more Pain Location: L shoulder with movement Pain Descriptors / Indicators: Guarding, Grimacing Pain Intervention(s): Monitored during session, Limited activity within patient's tolerance    Home Living Family/patient expects to be discharged to:: Skilled nursing facility       Prior Function Prior Level of Function : Needs assist  Cognitive Assist : ADLs (cognitive);Mobility (cognitive) Mobility (Cognitive): Intermittent cues ADLs (Cognitive): Intermittent cues Physical Assist : ADLs (physical);Mobility (physical) Mobility (physical): Gait;Stairs;Transfers ADLs (physical): Bathing;Dressing;IADLs Mobility  Comments: reports using RW ADLs Comments: needs  assist from spouse     Extremity/Trunk Assessment   Upper Extremity Assessment Upper Extremity Assessment: Defer to OT evaluation;Generalized weakness    Lower Extremity Assessment Lower Extremity Assessment: Generalized weakness    Cervical / Trunk Assessment Cervical / Trunk Assessment: Kyphotic  Communication   Communication Communication: Impaired Factors Affecting Communication: Hearing impaired    Cognition Arousal: Alert Behavior During Therapy: WFL for tasks assessed/performed   PT - Cognitive impairments: Awareness, Memory, Attention, Problem solving, Safety/Judgement, Sequencing, Initiation       Following commands: Intact       Cueing Cueing Techniques: Verbal cues, Visual cues, Tactile cues     General Comments General comments (skin integrity, edema, etc.): spouse present during session; RN assisted with clean up of bowel movement.        Assessment/Plan    PT Assessment Patient needs continued PT services  PT Problem List Decreased strength;Decreased balance;Decreased cognition;Pain;Decreased mobility;Decreased activity tolerance;Decreased safety awareness       PT Treatment Interventions DME instruction;Functional mobility training;Balance training;Patient/family education;Gait training;Therapeutic activities;Neuromuscular re-education;Wheelchair mobility training;Therapeutic exercise    PT Goals (Current goals can be found in the Care Plan section)  Acute Rehab PT Goals Patient Stated Goal: go home and walk PT Goal Formulation: With patient Time For Goal Achievement: 03/29/24 Potential to Achieve Goals: Fair    Frequency Min 1X/week        AM-PAC PT 6 Clicks Mobility  Outcome Measure Help needed turning from your back to your side while in a flat bed without using bedrails?: A Lot Help needed moving from lying on your back to sitting on the side of a flat bed without using bedrails?: A Lot Help needed moving to and from a bed to a  chair (including a wheelchair)?: Total Help needed standing up from a chair using your arms (e.g., wheelchair or bedside chair)?: Total Help needed to walk in hospital room?: Total Help needed climbing 3-5 steps with a railing? : Total 6 Click Score: 8    End of Session Equipment Utilized During Treatment: Gait belt Activity Tolerance: Patient limited by fatigue Patient left: in bed;with call bell/phone within reach;with family/visitor present Nurse Communication: Mobility status PT Visit Diagnosis: Unsteadiness on feet (R26.81);Other abnormalities of gait and mobility (R26.89);Muscle weakness (generalized) (M62.81);History of falling (Z91.81);Pain Pain - Right/Left: Left Pain - part of body: Shoulder;Leg    Time: 8550-8474 PT Time Calculation (min) (ACUTE ONLY): 36 min   Charges:   PT Evaluation $PT Eval Low Complexity: 1 Low PT Treatments $Therapeutic Activity: 8-22 mins PT General Charges $$ ACUTE PT VISIT: 1 Visit         Dorothyann Maier, DPT, CLT  Acute Rehabilitation Services Office: [phone] (Secure chat preferred)   Dorothyann VEAR Maier 03/15/2024, 4:30 PM

## 2024-03-15 NOTE — ED Provider Notes (Signed)
 Emergency Medicine Observation Re-evaluation Note  John [name] is a 78 y.o. male, seen on rounds today.  Pt initially presented to the ED for complaints of Fall Currently, the patient is resting in bed, wants to know where he will be going.  Physical Exam  BP (!) 159/77 (BP Location: Right Arm)   Pulse 60   Temp 98.4 F (36.9 C) (Oral)   Resp 18   SpO2 100%  Physical Exam General: NAD Cardiac: regular rate  Lungs: equal chest rise  Psych: calm  ED Course / MDM  EKG:   I have reviewed the labs performed to date as well as medications administered while in observation.  Recent changes in the last 24 hours include arrived in ER due  to fall. No significant injury. Wife not able to care for PT after recent stroke.  Plan  Current plan is for placement. PT ordered per SW request.    Francesca Elsie CROME, MD 03/15/24 [phone]

## 2024-03-15 NOTE — ED Notes (Signed)
 HIPAA form has been uploaded to the patient's chart, faxed to medical records and a hard copy placed in the medical records basket in the blue zone.

## 2024-03-15 NOTE — ED Notes (Signed)
 Cleaned pt of BM. Linens changed. F/C remains in place and draining appropriately. Pt able to roll moderately to assist with linen change.

## 2024-03-15 NOTE — ED Notes (Signed)
 Breakfast tray ordered for patient.

## 2024-03-15 NOTE — Telephone Encounter (Signed)
Unable to pend zofran

## 2024-03-15 NOTE — ED Notes (Signed)
PT at bedside assessing patient.

## 2024-03-15 NOTE — NC FL2 (Signed)
 John [name]  MEDICAID FL2 LEVEL OF CARE FORM     IDENTIFICATION  Patient Name: John [name] Birthdate: [birth year] Sex: male Admission Date (Current Location): 03/14/2024  [facility] and [place] Number:  Producer, Television/film/video and Address:  [place], [address] [place], [place] [address]      Provider Number: [id number]  Attending Physician Name and Address:  Neysa Caron PARAS, DO  Relative Name and Phone Number:  John [name], John [name] (Spouse)  [phone] (Mobile)    Current Level of Care: Hospital Recommended Level of Care: Skilled Nursing Facility Prior Approval Number:    Date Approved/Denied:   PASRR Number: [id number]  Discharge Plan: SNF    Current Diagnoses: Patient Active Problem List   Diagnosis Date Noted   CVA (cerebral vascular accident) (HCC) 02/03/2024   Prerenal azotemia 01/28/2024   Unresponsive episode 01/28/2024   Bacterial UTI 01/28/2024   Right pontine stroke (HCC) 01/24/2024   Acute cystitis without hematuria 01/17/2024   History of CVA (cerebrovascular accident) 01/17/2024   Weakness 01/17/2024   Left-sided weakness 01/16/2024   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) 01/03/2024   Atherosclerosis of coronary artery without angina pectoris 01/02/2024   Cognitive communication disorder 01/02/2024   Late effects of cerebrovascular disease 01/02/2024   Muscle weakness 01/02/2024   Status post coronary angioplasty 01/02/2024   Urinary tract obstruction 01/02/2024   Intractable nausea and vomiting 12/26/2023   Constipation 12/26/2023   AMS (altered mental status) 12/13/2023   Altered mental status 12/09/2023   Nausea & vomiting 12/09/2023   Status post placement of cardiac pacemaker 12/09/2023   Sepsis (HCC) 12/04/2023   CAD S/P percutaneous coronary angioplasty 12/04/2023   Microcytic hypochromic anemia 10/11/2023   HFrEF (heart failure with reduced ejection fraction) (HCC) 08/17/2023    DVT, femoral, chronic (HCC) 08/15/2023   Bladder outlet obstruction 08/15/2023   Sepsis due to Escherichia coli (HCC) 08/15/2023   Debility 08/13/2023   Non-ST elevation (NSTEMI) myocardial infarction (HCC) 08/10/2023   Cholelithiasis 08/07/2023   VTE (venous thromboembolism) 08/07/2023   Lactic acidosis 08/07/2023   Sacral pressure ulcer 08/07/2023   Streptococcal bacteremia 08/07/2023   B12 deficiency 08/07/2023   Iron deficiency anemia 08/07/2023   Severe sepsis (HCC) 08/06/2023   Chronic diastolic CHF (congestive heart failure) (HCC) 08/06/2023   Bacteriuria 08/06/2023   Acute encephalopathy 08/06/2023   Other pulmonary embolism without acute cor pulmonale (HCC) 06/16/2023   Essential hypertension 06/15/2023   Situational depression 06/15/2023   Protein-calorie malnutrition, severe 05/12/2023   Heart block AV complete (HCC) 05/12/2023   Syncope 05/10/2023   Fall at home, initial encounter 05/08/2023   Hypotension 05/08/2023   Abnormal LFTs 05/08/2023   Anemia 05/08/2023   Coping style affecting medical condition 05/02/2023   Acute renal failure superimposed on chronic kidney disease 04/28/2023   Brainstem infarct, acute (HCC) 04/25/2023   Pressure injury of skin 04/25/2023   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   Acute kidney injury superimposed on chronic kidney disease 04/17/2023   DKA (diabetic ketoacidosis) (HCC) 04/16/2023   Type 2 diabetes mellitus with hyperglycemia, with long-term current use of insulin  (HCC) 07/26/2022   Bronchitis 03/20/2021   Acute non-recurrent frontal sinusitis 03/20/2021   Viral URI with cough 01/13/2021   Hyperlipidemia 11/21/2020   Dizziness and giddiness 10/20/2020   Localized swelling of both lower extremities 10/20/2020   Tendinitis of right hip flexor 10/20/2020   Aortic atherosclerosis 09/10/2020   Insomnia 09/10/2020  Cognitive impairment 08/18/2020   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   Urinary retention  06/04/2020   History of COVID-19 12/10/2019   Physical deconditioning 12/10/2019   Fatigue 12/10/2019   Unsteady gait 12/10/2019   Pneumonia due to COVID-19 virus 10/26/2019   Peripheral neuropathy 10/17/2019   Depression, major, single episode, complete remission 10/17/2019   History of breast cancer    GERD (gastroesophageal reflux disease)    BPH (benign prostatic hyperplasia)    CTS (carpal tunnel syndrome)    Diabetic polyneuropathy associated with type 2 diabetes mellitus (HCC) 09/29/2019   Neuropathic pain of foot 09/29/2019   Foley catheter in place 01/03/2019   Peripheral sensory neuropathy 05/17/2018   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   Hypomagnesemia 01/18/2018   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) 12/13/2017   Axillary adenopathy 12/09/2017   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   S/P cataract extraction and insertion of intraocular lens 01/17/2017   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   PVD (posterior vitreous detachment), left 01/07/2017   Hyperopia of both eyes with astigmatism 11/09/2016   Class 1 obesity due to excess calories without serious comorbidity with body mass index (BMI) of 31.0 to 31.9 in adult 09/17/2016   Encounter for monitoring tamoxifen  therapy 03/05/2016   Insulin  dependent type 2 diabetes mellitus (HCC) 07/02/2015   Diabetes 1.5, 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   Palpitation 09/16/2014    Orientation RESPIRATION BLADDER Height & Weight     Self, Time, Situation, Place  Normal Continent Weight:   Height:     BEHAVIORAL SYMPTOMS/MOOD NEUROLOGICAL BOWEL NUTRITION STATUS      Continent Diet (see dc summary)  AMBULATORY STATUS COMMUNICATION OF NEEDS Skin   Limited Assist Verbally Normal                       Personal Care Assistance Level of Assistance  Bathing, Dressing, Feeding Bathing Assistance: Limited assistance Feeding assistance:  Independent Dressing Assistance: Limited assistance     Functional Limitations Info  Speech, Hearing, Sight Sight Info: Adequate Hearing Info: Adequate Speech Info: Adequate    SPECIAL CARE FACTORS FREQUENCY  PT (By licensed PT), OT (By licensed OT)     PT Frequency: 5x/wk OT Frequency: 5x/wk            Contractures Contractures Info: Not present    Additional Factors Info  Code Status, Allergies Code Status Info: DNR Allergies Info: Ramipril  Chlorhexidine   Adhesive (Tape)  Other  Sertraline           Current Medications (03/15/2024):  This is the current hospital active medication list Current Facility-Administered Medications  Medication Dose Route Frequency Provider Last Rate Last Admin   aspirin  EC tablet 81 mg  81 mg Oral Daily Bundy, Joshua S, PA-C       atorvastatin  (LIPITOR ) tablet 80 mg  80 mg Oral QHS Bundy, Joshua S, PA-C   80 mg at 03/15/24 0139   donepezil  (ARICEPT ) tablet 5 mg  5 mg Oral QHS Bundy, Joshua S, PA-C   5 mg at 03/15/24 9861   finasteride  (PROSCAR ) tablet 5 mg  5 mg Oral Daily Bundy, Joshua S, PA-C       insulin  aspart (novoLOG ) injection 3 Units  3 Units Subcutaneous TID WC Bundy, Joshua S, PA-C       insulin  glargine-yfgn (SEMGLEE ) injection 26 Units  26 Units Subcutaneous Daily Bundy, Joshua S, PA-C  magnesium  oxide (MAG-OX) tablet 400 mg  400 mg Oral Daily Bundy, Joshua S, PA-C       mirtazapine  (REMERON ) tablet 7.5 mg  7.5 mg Oral QHS Bundy, Joshua S, PA-C   7.5 mg at 03/15/24 0139   pantoprazole  (PROTONIX ) EC tablet 40 mg  40 mg Oral QHS Bundy, Joshua S, PA-C   40 mg at 03/15/24 9862   senna-docusate (Senokot-S) tablet 1 tablet  1 tablet Oral QHS Bundy, Joshua S, PA-C   1 tablet at 03/15/24 0138   Current Outpatient Medications  Medication Sig Dispense Refill   aspirin  EC 81 MG tablet Take 1 tablet (81 mg total) by mouth daily. Swallow whole.     atorvastatin  (LIPITOR ) 80 MG tablet Take 1 tablet (80 mg total) by mouth at bedtime.  90 tablet 1   camphor-menthol  (SARNA) lotion Apply topically as needed for itching.     donepezil  (ARICEPT ) 5 MG tablet Take 1 tablet (5 mg total) by mouth at bedtime. 90 tablet 1   escitalopram  (LEXAPRO ) 10 MG tablet Take 1 tablet (10 mg total) by mouth daily. 90 tablet 1   finasteride  (PROSCAR ) 5 MG tablet Take 1 tablet (5 mg total) by mouth daily. 30 tablet 3   insulin  aspart (NOVOLOG ) 100 UNIT/ML injection Inject 3 Units into the skin 3 (three) times daily with meals.     insulin  glargine-yfgn (SEMGLEE ) 100 UNIT/ML injection Inject 0.26 mLs (26 Units total) into the skin daily.     lidocaine  (LIDODERM ) 5 % Place 2 patches onto the skin daily. Remove & Discard patch within 12 hours or as directed by MD     magnesium  oxide (MAG-OX) 400 (240 Mg) MG tablet Take 1 tablet (400 mg total) by mouth daily. 90 tablet 1   mirtazapine  (REMERON ) 7.5 MG tablet Take 1 tablet (7.5 mg total) by mouth at bedtime. 90 tablet 1   pantoprazole  (PROTONIX ) 40 MG tablet Take 1 tablet (40 mg total) by mouth at bedtime. 30 tablet 0   polyethylene glycol powder (GLYCOLAX /MIRALAX ) 17 GM/SCOOP powder Take 17 g by mouth daily as needed. Dissolve 1 capful (17g) in 4-8 ounces of liquid and take by mouth daily. 238 g 0   senna-docusate (SENOKOT-S) 8.6-50 MG tablet Take 1 tablet by mouth at bedtime.       Discharge Medications: Please see discharge summary for a list of discharge medications.  Relevant Imaging Results:  Relevant Lab Results:   Additional Information SSN # [id number]  Sheri ONEIDA Sharps, LCSW

## 2024-03-15 NOTE — TOC Progression Note (Signed)
 Transition of Care [facility]) - Progression Note    Patient Details  Name: John [name] MRN: [id number] Date of Birth: May 24, 1945  Transition of Care [facility]) CM/SW Contact  Hartley KATHEE Robertson, LCSWA Phone Number: 03/15/2024, 6:10 PM  Clinical Narrative:     CSW spoke with pt's spouse [name], she states pt recently dc from [facility] and they would like him to return for more rehab. CSW advised pt may be in copay status, pt's spouse states she will pay what she needs to out of pocket. Spouse states initially when pt returned home pt was doing well and had services through [facility] but only one session was actually held. Insurance auth to be started, TOC to continue to follow.                      Expected Discharge Plan and Services                                               Social Drivers of Health (SDOH) Interventions SDOH Screenings   Food Insecurity: Patient Unable To Answer (01/29/2024)  Housing: Unknown (01/29/2024)  Transportation Needs: No Transportation Needs (01/29/2024)  Utilities: Not At Risk (01/29/2024)  Alcohol Screen: Low Risk  (01/27/2023)  Depression (PHQ2-9): Low Risk  (01/28/2023)  Financial Resource Strain: Low Risk  (10/03/2023)  Physical Activity: Inactive (10/03/2023)  Social Connections: Unknown (01/29/2024)  Recent Concern: Social Connections - Socially Isolated (12/05/2023)  Stress: No Stress Concern Present (10/03/2023)  Tobacco Use: Low Risk  (02/03/2024)    Readmission Risk Interventions    12/06/2023    4:03 PM 05/11/2023   12:52 PM 04/22/2023   12:12 PM  Readmission Risk Prevention Plan  Medication Screening   Complete  Transportation Screening Complete Complete Complete  PCP or Specialist Appt within 3-5 Days  Complete   HRI or Home Care Consult  Complete   Social Work Consult for Recovery Care Planning/Counseling  Complete   Palliative Care Screening  Not Applicable   Medication Review Oceanographer) Complete Complete    HRI or Home Care Consult Complete    SW Recovery Care/Counseling Consult Complete    Palliative Care Screening Not Applicable    Skilled Nursing Facility Not Applicable

## 2024-03-15 NOTE — ED Notes (Signed)
 Patient's spouse updated on plan of care and patient status.

## 2024-03-15 NOTE — Telephone Encounter (Signed)
 Error CRM sent. Refills were sent yesterday. 03/14/24

## 2024-03-15 NOTE — Progress Notes (Signed)
 Pending PT eval and recommendation.

## 2024-03-16 ENCOUNTER — Inpatient Hospital Stay: Admitting: Family Medicine

## 2024-03-16 ENCOUNTER — Encounter (HOSPITAL_COMMUNITY): Payer: Self-pay

## 2024-03-16 LAB — CBG MONITORING, ED
Glucose-Capillary: 133 mg/dL — ABNORMAL HIGH (ref 70–99)
Glucose-Capillary: 140 mg/dL — ABNORMAL HIGH (ref 70–99)
Glucose-Capillary: 81 mg/dL (ref 70–99)
Glucose-Capillary: 151 mg/dL — ABNORMAL HIGH (ref 70–99)

## 2024-03-16 MED ORDER — INSULIN GLARGINE 100 UNIT/ML ~~LOC~~ SOLN
26.0000 [IU] | Freq: Every day | SUBCUTANEOUS | Status: DC
  Administered 2024-03-18 – 2024-03-20 (×3): 26 [IU] via SUBCUTANEOUS
  Filled 2024-03-16 (×6): qty 0.26

## 2024-03-16 MED ORDER — INSULIN ASPART 100 UNIT/ML IJ SOLN
0.0000 [IU] | Freq: Three times a day (TID) | INTRAMUSCULAR | Status: DC
  Administered 2024-03-17: 5 [IU] via SUBCUTANEOUS
  Administered 2024-03-18: 7 [IU] via SUBCUTANEOUS
  Administered 2024-03-18 – 2024-03-19 (×3): 2 [IU] via SUBCUTANEOUS
  Administered 2024-03-19: 3 [IU] via SUBCUTANEOUS
  Administered 2024-03-20: 2 [IU] via SUBCUTANEOUS
  Administered 2024-03-20: 5 [IU] via SUBCUTANEOUS
  Filled 2024-03-16 (×3): qty 2
  Filled 2024-03-16: qty 3
  Filled 2024-03-16: qty 5
  Filled 2024-03-16: qty 2
  Filled 2024-03-16: qty 7
  Filled 2024-03-16: qty 3
  Filled 2024-03-16: qty 5

## 2024-03-16 NOTE — ED Notes (Signed)
 Patient sleeping in bed with regular, unlabored breathing.

## 2024-03-16 NOTE — ED Notes (Signed)
 Patient care taken over. Patient resting in bed in no acute distress.

## 2024-03-16 NOTE — ED Notes (Signed)
 Pt sleeping at this time. Rise and fall of chest noted. Pt in NAD at this time. Will continue to monitor.

## 2024-03-16 NOTE — Progress Notes (Signed)
 CSW presented bed offers to pt wife, bed choice is John [name]. Facility notified and will be reaching out to pt wife regarding possible co-pay days. Ins auth started; pending approval.

## 2024-03-16 NOTE — ED Notes (Signed)
 Pt repositioned in bed and lunch tray provided.

## 2024-03-16 NOTE — Inpatient Diabetes Management (Signed)
 Inpatient Diabetes Program Recommendations  AACE/ADA: New Consensus Statement on Inpatient Glycemic Control (2015)  Target Ranges:  Prepandial:   less than 140 mg/dL      Peak postprandial:   less than 180 mg/dL (1-2 hours)      Critically ill patients:  140 - 180 mg/dL   Lab Results  Component Value Date   GLUCAP 140 (H) 03/16/2024   HGBA1C 8.5 (H) 01/17/2024    Review of Glycemic Control  Latest Reference Range & Units 03/15/24 08:36 03/15/24 12:45 03/15/24 16:28 03/16/24 08:03 03/16/24 12:02  Glucose-Capillary 70 - 99 mg/dL 829 (H) 692 (H) 770 (H) 133 (H) 140 (H)  (H): Data is abnormally high  Diabetes history: DM2 Outpatient Diabetes medications: Novolog  3 units TID Semglee  26 units QD Current orders for Inpatient glycemic control:  Novolog  3 units TID Semglee  26 units QD  Inpatient Diabetes Program Recommendations:    Please consider:  Novolog  0-9 units TID  Thank you, Wyvonna Pinal, MSN, CDCES Diabetes Coordinator Inpatient Diabetes Program [phone] (team pager from 8a-5p)

## 2024-03-16 NOTE — ED Provider Notes (Signed)
 Emergency Medicine Observation Re-evaluation Note  Curtis Cain is a 78 y.o. male, seen on rounds today.  Pt initially presented to the ED for complaints of Fall Currently, the patient is nad.  Physical Exam  BP (!) 160/66 (BP Location: Right Arm)   Pulse 61   Temp 98.1 F (36.7 C) (Oral)   Resp 16   SpO2 100%  Physical Exam General: nad Cardiac: regular rate  Lungs: equal  Psych: calm  ED Course / MDM  EKG:   I have reviewed the labs performed to date as well as medications administered while in observation.  Recent changes in the last 24 hours include none .  Plan  Current plan is for placement.  Case management is well social work following.  PT following.[name] Simon Lavonia LOISE, MD 03/16/24 [phone]

## 2024-03-16 NOTE — ED Notes (Signed)
 Patient rounded on and given warm blankets. Patient denies other needs at this time.

## 2024-03-17 LAB — CBG MONITORING, ED
Glucose-Capillary: 116 mg/dL — ABNORMAL HIGH (ref 70–99)
Glucose-Capillary: 229 mg/dL — ABNORMAL HIGH (ref 70–99)
Glucose-Capillary: 265 mg/dL — ABNORMAL HIGH (ref 70–99)
Glucose-Capillary: 222 mg/dL — ABNORMAL HIGH (ref 70–99)

## 2024-03-17 NOTE — ED Notes (Signed)
 Provided hygiene care and bed bath after incontinent episode from patient. Clean linen, bed pad, and brief placed on patient. Pt is clean and resting at this time.

## 2024-03-17 NOTE — ED Provider Notes (Signed)
 Emergency Medicine Observation Re-evaluation Note  Pryce Folts is a 78 y.o. male, seen on rounds today.  Pt initially presented to the ED for complaints of Fall Currently, the patient is asleep.  Pt came to the ED on 12/3 for a fall.  His wife can't take care of him by herself.  His trauma eval was negative and SW was consulted for placement.  [facility] is a possibility.  Physical Exam  BP (!) 148/68 (BP Location: Right Arm)   Pulse 62   Temp 98.4 F (36.9 C) (Oral)   Resp 16   SpO2 98%  Physical Exam General: asleep Cardiac: rr Lungs: clear Psych: calm  ED Course / MDM  EKG:   I have reviewed the labs performed to date as well as medications administered while in observation.  Recent changes in the last 24 hours include none.  Plan  Current plan is for SNF placement.    Dean Clarity, MD 03/17/24 [phone]

## 2024-03-17 NOTE — ED Notes (Signed)
 PT awoken and set up to eat lunch.

## 2024-03-17 NOTE — ED Notes (Signed)
 Patient observed resting comfortable with steady breathing. Patient has call bell within reach.

## 2024-03-17 NOTE — ED Notes (Signed)
 PT is in bed asleep with bed locked in lowest position at this time.

## 2024-03-17 NOTE — ED Notes (Signed)
 PT is currently asleep

## 2024-03-18 LAB — CBG MONITORING, ED
Glucose-Capillary: 171 mg/dL — ABNORMAL HIGH (ref 70–99)
Glucose-Capillary: 197 mg/dL — ABNORMAL HIGH (ref 70–99)
Glucose-Capillary: 304 mg/dL — ABNORMAL HIGH (ref 70–99)

## 2024-03-18 NOTE — ED Notes (Signed)
 PT boosted up in bed at this time. Offered to turn patient. PT declined. PT educated on bedsores and to move and shift in bed.  Breathing is even and unlabored.

## 2024-03-18 NOTE — ED Provider Notes (Signed)
 Emergency Medicine Observation Re-evaluation Note  John [name] is a 78 y.o. male, seen on rounds today.  Pt initially presented to the ED for complaints of Fall Currently, the patient is asleep.  Pt was brought to the ED on 12/3 for a fall.  He was medically cleared, but he is unable to go home because his wife can no longer care for him.  He's been waiting for SNF placement.  He declined PT today per nursing note.  Physical Exam  BP 127/74   Pulse 89   Temp 98.9 F (37.2 C) (Oral)   Resp 14   SpO2 96%  Physical Exam General: asleep Cardiac: rr Lungs: clear Psych: calm  ED Course / MDM  EKG:   I have reviewed the labs performed to date as well as medications administered while in observation.  Recent changes in the last 24 hours include none.  Plan  Current plan is for SNF placement.  He is medically clear.    Dean Clarity, MD 03/18/24 1137

## 2024-03-18 NOTE — ED Notes (Signed)
 PT eating lunch, Breathing is even and unlabored.

## 2024-03-18 NOTE — ED Notes (Signed)
Breathing is even and unlabored.

## 2024-03-18 NOTE — Progress Notes (Signed)
 CSW spoke to [place] with [facility] who is currently reviewing patient request. At this time they are requesting an updated summary stating that the patient is medically cleared as well any updated provider notes in the last 24 HRS. CSW did explain that the patient is in the ED awaiting placement. CSW has reached out to RN to have the EDP added to chat to gather the information needed to send to [facility]. Deadline for information is 03/19/2024.  ICM will continue to follow

## 2024-03-18 NOTE — Progress Notes (Signed)
 Per [facility] place INS is still pending.

## 2024-03-19 LAB — CBG MONITORING, ED
Glucose-Capillary: 205 mg/dL — ABNORMAL HIGH (ref 70–99)
Glucose-Capillary: 198 mg/dL — ABNORMAL HIGH (ref 70–99)
Glucose-Capillary: 182 mg/dL — ABNORMAL HIGH (ref 70–99)
Glucose-Capillary: 244 mg/dL — ABNORMAL HIGH (ref 70–99)

## 2024-03-19 NOTE — Inpatient Diabetes Management (Signed)
 Inpatient Diabetes Program Recommendations  AACE/ADA: New Consensus Statement on Inpatient Glycemic Control   Target Ranges:  Prepandial:   less than 140 mg/dL      Peak postprandial:   less than 180 mg/dL (1-2 hours)      Critically ill patients:  140 - 180 mg/dL   Lab Results  Component Value Date   GLUCAP 205 (H) 03/19/2024   HGBA1C 8.5 (H) 01/17/2024    Latest Reference Range & Units 03/17/24 15:24 03/17/24 16:35 03/17/24 22:17 03/18/24 07:35 03/18/24 11:27 03/18/24 19:07 03/19/24 08:12  Glucose-Capillary 70 - 99 mg/dL 770 (H) 734 (H) 777 (H) 171 (H) 197 (H) 304 (H) 205 (H)   Review of Glycemic Control  Diabetes history: DM2  Outpatient Diabetes medications: Novolog  3 units TID Semglee  26 units QD  Current orders for Inpatient glycemic control:  Novolog  0-9 units TID Semglee  26 units QD   Inpatient Diabetes Program Recommendations:     Please consider starting Novolog  3 units TID with meals (if patient consumes atleast 50% of meal).   Thanks,  Lavanda Search, RN, MSN, [facility]  Inpatient Diabetes Coordinator  Pager [phone] (8a-5p)

## 2024-03-19 NOTE — Progress Notes (Signed)
 CSW reviewed voicemail left by [facility]. [employer] is requesting a peer to peer to be completed by 03/20/24 at 9:00 AM. The ED provider will need to have patients name, DOB, and member ID. The number to call is [phone], Option 5. CSW spoke with ED provider who stated they should be available to complete the P2P after 6:00 PM today.

## 2024-03-19 NOTE — ED Notes (Signed)
 Pt denies any pain or discomfort at this time. No Distress noted. Respirations easy and regular. Pt breakfast tray set up, all items opened for pt and within reach. Call bell within reach. Pt encouraged to notify staff for any and all needs.

## 2024-03-19 NOTE — ED Provider Notes (Signed)
 Emergency Medicine Observation Re-evaluation Note  John [name] is a 78 y.o. male, seen on rounds today.  Pt initially presented to the ED for complaints of Fall Currently, the patient is 78yo male with history of CVA 10/24 with left sided weakness, recently out of rehab, who presented after a fall - was medically cleared however wife is having difficulty caring for him since the CVA and they request rehab placement.    No concerns this AM, awaiting placement. Physical Exam  BP 132/76 (BP Location: Right Arm)   Pulse 85   Temp 98.4 F (36.9 C) (Oral)   Resp 14   SpO2 100%  Physical Exam General: NAD Cardiac: RR Lungs: even unlabored Psych: NA  ED Course / MDM  EKG:   I have reviewed the labs performed to date as well as medications administered while in observation.  Recent changes in the last 24 hours include none.  Plan  Current plan is for awaiting SNF.    Dreama Longs, MD 03/19/24 2147

## 2024-03-19 NOTE — Progress Notes (Addendum)
 John [name] is still pending.   ED Provider note was uploaded to [facility] yesterday, 12/7.  Addend @ 12:48PM Auth is still pending. No communication in [place] requesting additional documentation.

## 2024-03-19 NOTE — Discharge Instructions (Signed)
Private Pay Resources  [facility] Address: [address], [place], [place] [address] Phone: [phone]  [facility] Address: [address], [place], [place] [address] Phone: [phone]  [facility] Address: [address], [place], [place] [address] Phone: [phone]  [facility] Address: [address], , [place] [address] Phone: [phone]  [facility] Address: [address], [place], [place] [address] Phone: [phone]  [facility] Phone: [phone]  [facility] Address:  [address], [place], [place] [address] Phone:  [phone]  [facility] Address:  [address] Phone:  [phone]  [email or web address]  [facility] Phone: [phone]   

## 2024-03-19 NOTE — Progress Notes (Signed)
 CSW spoke with ED provider who completed P2P. The patient has been approved for SNF. ICM will reach out to admissions at [facility] rehab to verify that patient can discharge to their facility tomorrow. The patient's wife was also notified and agrees with plan.

## 2024-03-20 LAB — CBG MONITORING, ED
Glucose-Capillary: 161 mg/dL — ABNORMAL HIGH (ref 70–99)
Glucose-Capillary: 194 mg/dL — ABNORMAL HIGH (ref 70–99)
Glucose-Capillary: 257 mg/dL — ABNORMAL HIGH (ref 70–99)

## 2024-03-20 NOTE — Progress Notes (Signed)
 PT Cancellation Note  Patient Details Name: John [name] MRN: [id number] DOB: [birth year]   Cancelled Treatment:    Reason Eval/Treat Not Completed: Other (comment). Pt discharging to [facility] today. Currently awaiting PTAR.   Erven Sari Shaker 03/20/2024, 11:56 AM

## 2024-03-20 NOTE — Progress Notes (Signed)
 SNF bed ready, pt dc to [facility]. Call report [phone] room 1202.

## 2024-03-20 NOTE — ED Notes (Signed)
 Pt slid up in bed, offered and pt declines repositioning to side, pt educated and understands risk and benefits.

## 2024-03-20 NOTE — ED Provider Notes (Signed)
 Emergency Medicine Observation Re-evaluation Note  John [name] is a 78 y.o. male, seen on rounds today.  Pt initially presented to the ED for complaints of Fall Currently, the patient is resting.  Physical Exam  BP (!) 156/65 (BP Location: Right Arm)   Pulse 66   Temp 98.4 F (36.9 C) (Oral)   Resp 16   SpO2 98%  Physical Exam General: nad  ED Course / MDM  EKG:   I have reviewed the labs performed to date as well as medications administered while in observation.  Recent changes in the last 24 hours include no change.  Plan  Current plan is for placement/snf ([facility]?).    Elnor Jayson LABOR, DO 03/20/24 [phone]

## 2024-03-20 NOTE — ED Notes (Signed)
 PTAR scheduled for the patient to return to [facility].  RN made aware and per dispatch, someone should be here shortly

## 2024-03-23 ENCOUNTER — Telehealth: Payer: Self-pay

## 2024-03-23 DIAGNOSIS — I639 Cerebral infarction, unspecified: Secondary | ICD-10-CM | POA: Diagnosis not present

## 2024-03-23 DIAGNOSIS — I69354 Hemiplegia and hemiparesis following cerebral infarction affecting left non-dominant side: Secondary | ICD-10-CM | POA: Diagnosis not present

## 2024-03-23 DIAGNOSIS — R2689 Other abnormalities of gait and mobility: Secondary | ICD-10-CM | POA: Diagnosis not present

## 2024-03-23 DIAGNOSIS — M6281 Muscle weakness (generalized): Secondary | ICD-10-CM | POA: Diagnosis not present

## 2024-03-23 NOTE — Telephone Encounter (Signed)
 Copied from CRM #[id number]. Topic: Clinical - Medication Question >> Mar 23, 2024 10:37 AM Ashley R wrote: Reason for CRM: wants to confirm losartan  has been discontinued for patient. Garrel (Clinical Pharmacist [employer])- secure VM [phone] Ext. [phone]

## 2024-03-23 NOTE — Telephone Encounter (Signed)
 Yes, it has been removed throughout his hospitalizations.  Thank you.

## 2024-03-27 DIAGNOSIS — I639 Cerebral infarction, unspecified: Secondary | ICD-10-CM | POA: Diagnosis not present

## 2024-03-27 DIAGNOSIS — I69354 Hemiplegia and hemiparesis following cerebral infarction affecting left non-dominant side: Secondary | ICD-10-CM | POA: Diagnosis not present

## 2024-03-27 DIAGNOSIS — R2689 Other abnormalities of gait and mobility: Secondary | ICD-10-CM | POA: Diagnosis not present

## 2024-03-27 DIAGNOSIS — M6281 Muscle weakness (generalized): Secondary | ICD-10-CM | POA: Diagnosis not present

## 2024-03-29 DIAGNOSIS — I420 Dilated cardiomyopathy: Secondary | ICD-10-CM | POA: Diagnosis not present

## 2024-03-29 LAB — CUP PACEART REMOTE DEVICE CHECK
Battery Remaining Longevity: 118 mo
Battery Remaining Percentage: 95 %
Battery Voltage: 3.01 V
Brady Statistic AP VP Percent: 1 %
Brady Statistic AP VS Percent: 24 %
Brady Statistic AS VP Percent: 1 %
Brady Statistic AS VS Percent: 75 %
Brady Statistic RA Percent Paced: 24 %
Brady Statistic RV Percent Paced: 1 %
Date Time Interrogation Session: 20251218020015
Implantable Lead Connection Status: 753985
Implantable Lead Connection Status: 753985
Implantable Lead Implant Date: 20250131
Implantable Lead Implant Date: 20250131
Implantable Lead Location: 753859
Implantable Lead Location: 753860
Implantable Pulse Generator Implant Date: 20250131
Lead Channel Impedance Value: 390 Ohm
Lead Channel Impedance Value: 430 Ohm
Lead Channel Pacing Threshold Amplitude: 0.75 V
Lead Channel Pacing Threshold Amplitude: 0.75 V
Lead Channel Pacing Threshold Pulse Width: 0.5 ms
Lead Channel Pacing Threshold Pulse Width: 0.5 ms
Lead Channel Sensing Intrinsic Amplitude: 1.5 mV
Lead Channel Sensing Intrinsic Amplitude: 4 mV
Lead Channel Setting Pacing Amplitude: 1 V
Lead Channel Setting Pacing Amplitude: 2 V
Lead Channel Setting Pacing Pulse Width: 0.5 ms
Lead Channel Setting Sensing Sensitivity: 0.5 mV
Pulse Gen Model: 2272
Pulse Gen Serial Number: 8241762

## 2024-03-30 ENCOUNTER — Ambulatory Visit: Payer: Self-pay | Admitting: Cardiology

## 2024-04-01 NOTE — Progress Notes (Signed)
 Remote PPM Transmission

## 2024-04-03 ENCOUNTER — Telehealth: Admitting: Family Medicine

## 2024-04-03 ENCOUNTER — Encounter: Payer: Self-pay | Admitting: Family Medicine

## 2024-04-03 DIAGNOSIS — I6930 Unspecified sequelae of cerebral infarction: Secondary | ICD-10-CM | POA: Diagnosis not present

## 2024-04-03 NOTE — Progress Notes (Signed)
 CC: Paperwork discussion  Subjective: Patient is a 78 y.o. male here for completion of a form. We are interacting via web portal for an electronic face-to-face visit. I verified patient's ID using 2 identifiers. Patient agreed to proceed with visit via this method. Patient is at home, I am at office. Patient, his wife and I are present for visit.   Patient [place] for a long years ago for his son.  He was gainfully employed then.  Unfortunately over the last year and a half, he has had several strokes limiting his ability to walk, balance, and thrive with his short-term memory.  His wife is asking a fill in the form illustrating this.  Past Medical History:  Diagnosis Date   BPH (benign prostatic hyperplasia)    CTS (carpal tunnel syndrome)    Depression    Diabetes mellitus    Essential hypertension    GERD (gastroesophageal reflux disease)    History of breast cancer    Hypercholesteremia    Neuropathy    Stroke (HCC)     Objective: No conversational dyspnea Age appropriate judgment and insight Nml affect and mood  Assessment and Plan: [place], post-stroke  Form completed and placed upfront for pickup for his wife.  Follow-up as originally scheduled. The patient and his wife voiced understanding and agreement to the plan.  I spent 20 minutes with the patient discussing the above plan in addition to reviewing his chart and filling out paperwork on the same day of the visit.  John Mt [place], DO 04/03/2024  3:03 PM

## 2024-04-18 ENCOUNTER — Ambulatory Visit: Admitting: Neurology

## 2024-05-01 ENCOUNTER — Emergency Department (HOSPITAL_COMMUNITY)

## 2024-05-01 ENCOUNTER — Inpatient Hospital Stay (HOSPITAL_COMMUNITY)
Admission: EM | Admit: 2024-05-01 | Discharge: 2024-05-10 | DRG: 698 | Disposition: A | Discharge status: Hospice | Attending: Family Medicine | Admitting: Family Medicine

## 2024-05-01 ENCOUNTER — Other Ambulatory Visit: Payer: Self-pay

## 2024-05-01 ENCOUNTER — Encounter (HOSPITAL_COMMUNITY): Payer: Self-pay | Admitting: Emergency Medicine

## 2024-05-01 DIAGNOSIS — E876 Hypokalemia: Secondary | ICD-10-CM | POA: Diagnosis not present

## 2024-05-01 DIAGNOSIS — Z9221 Personal history of antineoplastic chemotherapy: Secondary | ICD-10-CM

## 2024-05-01 DIAGNOSIS — F0393 Unspecified dementia, unspecified severity, with mood disturbance: Secondary | ICD-10-CM | POA: Diagnosis present

## 2024-05-01 DIAGNOSIS — I495 Sick sinus syndrome: Secondary | ICD-10-CM | POA: Diagnosis present

## 2024-05-01 DIAGNOSIS — G9341 Metabolic encephalopathy: Secondary | ICD-10-CM | POA: Diagnosis present

## 2024-05-01 DIAGNOSIS — I442 Atrioventricular block, complete: Secondary | ICD-10-CM | POA: Diagnosis present

## 2024-05-01 DIAGNOSIS — Z8041 Family history of malignant neoplasm of ovary: Secondary | ICD-10-CM

## 2024-05-01 DIAGNOSIS — Z7901 Long term (current) use of anticoagulants: Secondary | ICD-10-CM | POA: Diagnosis not present

## 2024-05-01 DIAGNOSIS — H9190 Unspecified hearing loss, unspecified ear: Secondary | ICD-10-CM | POA: Diagnosis present

## 2024-05-01 DIAGNOSIS — L89152 Pressure ulcer of sacral region, stage 2: Secondary | ICD-10-CM | POA: Diagnosis present

## 2024-05-01 DIAGNOSIS — N179 Acute kidney failure, unspecified: Secondary | ICD-10-CM | POA: Diagnosis not present

## 2024-05-01 DIAGNOSIS — E1140 Type 2 diabetes mellitus with diabetic neuropathy, unspecified: Secondary | ICD-10-CM | POA: Diagnosis present

## 2024-05-01 DIAGNOSIS — Z9841 Cataract extraction status, right eye: Secondary | ICD-10-CM

## 2024-05-01 DIAGNOSIS — Z807 Family history of other malignant neoplasms of lymphoid, hematopoietic and related tissues: Secondary | ICD-10-CM

## 2024-05-01 DIAGNOSIS — I69354 Hemiplegia and hemiparesis following cerebral infarction affecting left non-dominant side: Secondary | ICD-10-CM

## 2024-05-01 DIAGNOSIS — Z833 Family history of diabetes mellitus: Secondary | ICD-10-CM

## 2024-05-01 DIAGNOSIS — A409 Streptococcal sepsis, unspecified: Secondary | ICD-10-CM | POA: Diagnosis not present

## 2024-05-01 DIAGNOSIS — N401 Enlarged prostate with lower urinary tract symptoms: Secondary | ICD-10-CM | POA: Diagnosis present

## 2024-05-01 DIAGNOSIS — Y846 Urinary catheterization as the cause of abnormal reaction of the patient, or of later complication, without mention of misadventure at the time of the procedure: Secondary | ICD-10-CM | POA: Diagnosis present

## 2024-05-01 DIAGNOSIS — Z9842 Cataract extraction status, left eye: Secondary | ICD-10-CM

## 2024-05-01 DIAGNOSIS — A419 Sepsis, unspecified organism: Principal | ICD-10-CM | POA: Diagnosis present

## 2024-05-01 DIAGNOSIS — R6521 Severe sepsis with septic shock: Secondary | ICD-10-CM | POA: Diagnosis present

## 2024-05-01 DIAGNOSIS — R310 Gross hematuria: Secondary | ICD-10-CM | POA: Diagnosis present

## 2024-05-01 DIAGNOSIS — Z955 Presence of coronary angioplasty implant and graft: Secondary | ICD-10-CM

## 2024-05-01 DIAGNOSIS — F0390 Unspecified dementia without behavioral disturbance: Secondary | ICD-10-CM | POA: Diagnosis not present

## 2024-05-01 DIAGNOSIS — Z87440 Personal history of urinary (tract) infections: Secondary | ICD-10-CM

## 2024-05-01 DIAGNOSIS — I4891 Unspecified atrial fibrillation: Secondary | ICD-10-CM

## 2024-05-01 DIAGNOSIS — Z66 Do not resuscitate: Secondary | ICD-10-CM | POA: Diagnosis present

## 2024-05-01 DIAGNOSIS — D638 Anemia in other chronic diseases classified elsewhere: Secondary | ICD-10-CM | POA: Diagnosis present

## 2024-05-01 DIAGNOSIS — F32A Depression, unspecified: Secondary | ICD-10-CM | POA: Diagnosis present

## 2024-05-01 DIAGNOSIS — I482 Chronic atrial fibrillation, unspecified: Secondary | ICD-10-CM | POA: Diagnosis present

## 2024-05-01 DIAGNOSIS — Z7982 Long term (current) use of aspirin: Secondary | ICD-10-CM

## 2024-05-01 DIAGNOSIS — E860 Dehydration: Secondary | ICD-10-CM | POA: Diagnosis present

## 2024-05-01 DIAGNOSIS — N390 Urinary tract infection, site not specified: Secondary | ICD-10-CM | POA: Diagnosis present

## 2024-05-01 DIAGNOSIS — Z515 Encounter for palliative care: Secondary | ICD-10-CM | POA: Diagnosis not present

## 2024-05-01 DIAGNOSIS — B952 Enterococcus as the cause of diseases classified elsewhere: Secondary | ICD-10-CM | POA: Diagnosis present

## 2024-05-01 DIAGNOSIS — E872 Acidosis, unspecified: Secondary | ICD-10-CM | POA: Diagnosis present

## 2024-05-01 DIAGNOSIS — I5020 Unspecified systolic (congestive) heart failure: Secondary | ICD-10-CM | POA: Diagnosis not present

## 2024-05-01 DIAGNOSIS — R1110 Vomiting, unspecified: Secondary | ICD-10-CM

## 2024-05-01 DIAGNOSIS — I110 Hypertensive heart disease with heart failure: Secondary | ICD-10-CM | POA: Diagnosis present

## 2024-05-01 DIAGNOSIS — E861 Hypovolemia: Secondary | ICD-10-CM | POA: Diagnosis present

## 2024-05-01 DIAGNOSIS — T83518A Infection and inflammatory reaction due to other urinary catheter, initial encounter: Secondary | ICD-10-CM | POA: Diagnosis present

## 2024-05-01 DIAGNOSIS — N138 Other obstructive and reflux uropathy: Secondary | ICD-10-CM | POA: Diagnosis present

## 2024-05-01 DIAGNOSIS — K219 Gastro-esophageal reflux disease without esophagitis: Secondary | ICD-10-CM | POA: Diagnosis not present

## 2024-05-01 DIAGNOSIS — E871 Hypo-osmolality and hyponatremia: Secondary | ICD-10-CM | POA: Diagnosis present

## 2024-05-01 DIAGNOSIS — D649 Anemia, unspecified: Secondary | ICD-10-CM | POA: Diagnosis not present

## 2024-05-01 DIAGNOSIS — K8012 Calculus of gallbladder with acute and chronic cholecystitis without obstruction: Secondary | ICD-10-CM | POA: Diagnosis present

## 2024-05-01 DIAGNOSIS — I2510 Atherosclerotic heart disease of native coronary artery without angina pectoris: Secondary | ICD-10-CM | POA: Diagnosis present

## 2024-05-01 DIAGNOSIS — Z794 Long term (current) use of insulin: Secondary | ICD-10-CM

## 2024-05-01 DIAGNOSIS — I5022 Chronic systolic (congestive) heart failure: Secondary | ICD-10-CM | POA: Diagnosis present

## 2024-05-01 DIAGNOSIS — Z950 Presence of cardiac pacemaker: Secondary | ICD-10-CM

## 2024-05-01 DIAGNOSIS — R627 Adult failure to thrive: Secondary | ICD-10-CM | POA: Diagnosis present

## 2024-05-01 DIAGNOSIS — Z9109 Other allergy status, other than to drugs and biological substances: Secondary | ICD-10-CM

## 2024-05-01 DIAGNOSIS — I4510 Unspecified right bundle-branch block: Secondary | ICD-10-CM | POA: Diagnosis present

## 2024-05-01 DIAGNOSIS — E119 Type 2 diabetes mellitus without complications: Secondary | ICD-10-CM | POA: Diagnosis not present

## 2024-05-01 DIAGNOSIS — Z79899 Other long term (current) drug therapy: Secondary | ICD-10-CM

## 2024-05-01 DIAGNOSIS — Z823 Family history of stroke: Secondary | ICD-10-CM

## 2024-05-01 DIAGNOSIS — E7800 Pure hypercholesterolemia, unspecified: Secondary | ICD-10-CM | POA: Diagnosis present

## 2024-05-01 DIAGNOSIS — Z888 Allergy status to other drugs, medicaments and biological substances status: Secondary | ICD-10-CM

## 2024-05-01 DIAGNOSIS — Z853 Personal history of malignant neoplasm of breast: Secondary | ICD-10-CM

## 2024-05-01 LAB — I-STAT CHEM 8, ED
BUN: 28 mg/dL — ABNORMAL HIGH (ref 8–23)
Calcium, Ion: 1.03 mmol/L — ABNORMAL LOW (ref 1.15–1.40)
Chloride: 93 mmol/L — ABNORMAL LOW (ref 98–111)
Creatinine, Ser: 1.2 mg/dL (ref 0.61–1.24)
Glucose, Bld: 200 mg/dL — ABNORMAL HIGH (ref 70–99)
HCT: 34 % — ABNORMAL LOW (ref 39.0–52.0)
Hemoglobin: 11.6 g/dL — ABNORMAL LOW (ref 13.0–17.0)
Potassium: 2.9 mmol/L — ABNORMAL LOW (ref 3.5–5.1)
Sodium: 131 mmol/L — ABNORMAL LOW (ref 135–145)
TCO2: 27 mmol/L (ref 22–32)

## 2024-05-01 LAB — I-STAT ARTERIAL BLOOD GAS, ED
Acid-base deficit: 6 mmol/L — ABNORMAL HIGH (ref 0.0–2.0)
Bicarbonate: 16.5 mmol/L — ABNORMAL LOW (ref 20.0–28.0)
Calcium, Ion: 1.07 mmol/L — ABNORMAL LOW (ref 1.15–1.40)
HCT: 29 % — ABNORMAL LOW (ref 39.0–52.0)
Hemoglobin: 9.9 g/dL — ABNORMAL LOW (ref 13.0–17.0)
O2 Saturation: 91 %
Patient temperature: 99
Potassium: 2.8 mmol/L — ABNORMAL LOW (ref 3.5–5.1)
Sodium: 130 mmol/L — ABNORMAL LOW (ref 135–145)
TCO2: 17 mmol/L — ABNORMAL LOW (ref 22–32)
pCO2 arterial: 24.3 mmHg — ABNORMAL LOW (ref 32–48)
pH, Arterial: 7.441 (ref 7.35–7.45)
pO2, Arterial: 57 mmHg — ABNORMAL LOW (ref 83–108)

## 2024-05-01 LAB — HEPATIC FUNCTION PANEL
ALT: 13 U/L (ref 0–44)
AST: 28 U/L (ref 15–41)
Albumin: 3 g/dL — ABNORMAL LOW (ref 3.5–5.0)
Alkaline Phosphatase: 159 U/L — ABNORMAL HIGH (ref 38–126)
Bilirubin, Direct: 0.5 mg/dL — ABNORMAL HIGH (ref 0.0–0.2)
Indirect Bilirubin: 0.8 mg/dL (ref 0.3–0.9)
Total Bilirubin: 1.4 mg/dL — ABNORMAL HIGH (ref 0.0–1.2)
Total Protein: 5.9 g/dL — ABNORMAL LOW (ref 6.5–8.1)

## 2024-05-01 LAB — CBC WITH DIFFERENTIAL/PLATELET
Abs Immature Granulocytes: 0.2 K/uL — ABNORMAL HIGH (ref 0.00–0.07)
Basophils Absolute: 0 K/uL (ref 0.0–0.1)
Basophils Relative: 0 %
Eosinophils Absolute: 0 K/uL (ref 0.0–0.5)
Eosinophils Relative: 0 %
HCT: 33.6 % — ABNORMAL LOW (ref 39.0–52.0)
Hemoglobin: 11.5 g/dL — ABNORMAL LOW (ref 13.0–17.0)
Immature Granulocytes: 1 %
Lymphocytes Relative: 4 %
Lymphs Abs: 0.8 K/uL (ref 0.7–4.0)
MCH: 28.4 pg (ref 26.0–34.0)
MCHC: 34.2 g/dL (ref 30.0–36.0)
MCV: 83 fL (ref 80.0–100.0)
Monocytes Absolute: 1.2 K/uL — ABNORMAL HIGH (ref 0.1–1.0)
Monocytes Relative: 7 %
Neutro Abs: 15.4 K/uL — ABNORMAL HIGH (ref 1.7–7.7)
Neutrophils Relative %: 88 %
Platelets: 327 K/uL (ref 150–400)
RBC: 4.05 MIL/uL — ABNORMAL LOW (ref 4.22–5.81)
RDW: 14.9 % (ref 11.5–15.5)
WBC: 17.6 K/uL — ABNORMAL HIGH (ref 4.0–10.5)
nRBC: 0 % (ref 0.0–0.2)

## 2024-05-01 LAB — BASIC METABOLIC PANEL WITH GFR
Anion gap: 13 (ref 5–15)
BUN: 27 mg/dL — ABNORMAL HIGH (ref 8–23)
CO2: 24 mmol/L (ref 22–32)
Calcium: 8.5 mg/dL — ABNORMAL LOW (ref 8.9–10.3)
Chloride: 92 mmol/L — ABNORMAL LOW (ref 98–111)
Creatinine, Ser: 1.2 mg/dL (ref 0.61–1.24)
GFR, Estimated: >60 mL/min
Glucose, Bld: 198 mg/dL — ABNORMAL HIGH (ref 70–99)
Potassium: 2.9 mmol/L — ABNORMAL LOW (ref 3.5–5.1)
Sodium: 130 mmol/L — ABNORMAL LOW (ref 135–145)

## 2024-05-01 LAB — I-STAT CG4 LACTIC ACID, ED
Lactic Acid, Venous: 2.8 mmol/L (ref 0.5–1.9)
Lactic Acid, Venous: 8.3 mmol/L (ref 0.5–1.9)
Lactic Acid, Venous: 10.1 mmol/L (ref 0.5–1.9)

## 2024-05-01 LAB — URINALYSIS, W/ REFLEX TO CULTURE (INFECTION SUSPECTED)
Bilirubin Urine: NEGATIVE
Glucose, UA: NEGATIVE mg/dL
Ketones, ur: NEGATIVE mg/dL
Nitrite: NEGATIVE
Protein, ur: 100 mg/dL — AB
RBC / HPF: >50 RBC/hpf (ref 0–5)
Specific Gravity, Urine: 1.028 (ref 1.005–1.030)
WBC, UA: >50 WBC/hpf (ref 0–5)
pH: 9 — ABNORMAL HIGH (ref 5.0–8.0)

## 2024-05-01 LAB — GLUCOSE, CAPILLARY
Glucose-Capillary: 153 mg/dL — ABNORMAL HIGH (ref 70–99)
Glucose-Capillary: 141 mg/dL — ABNORMAL HIGH (ref 70–99)

## 2024-05-01 LAB — C DIFFICILE QUICK SCREEN W PCR REFLEX
C Diff antigen: NEGATIVE
C Diff interpretation: NOT DETECTED
C Diff toxin: NEGATIVE

## 2024-05-01 LAB — MAGNESIUM: Magnesium: 1.6 mg/dL — ABNORMAL LOW (ref 1.7–2.4)

## 2024-05-01 LAB — CBG MONITORING, ED: Glucose-Capillary: 205 mg/dL — ABNORMAL HIGH (ref 70–99)

## 2024-05-01 LAB — LIPASE, BLOOD: Lipase: 16 U/L (ref 11–51)

## 2024-05-01 MED ORDER — CALCIUM GLUCONATE-NACL 1-0.675 GM/50ML-% IV SOLN
1.0000 g | Freq: Once | INTRAVENOUS | Status: AC
  Administered 2024-05-01: 1000 mg via INTRAVENOUS
  Filled 2024-05-01: qty 50

## 2024-05-01 MED ORDER — INSULIN GLARGINE-YFGN 100 UNIT/ML ~~LOC~~ SOLN
10.0000 [IU] | Freq: Every day | SUBCUTANEOUS | Status: DC
  Filled 2024-05-01 (×2): qty 0.1

## 2024-05-01 MED ORDER — ONDANSETRON HCL 4 MG/2ML IJ SOLN: 4.0000 mg | Freq: Four times a day (QID) | INTRAMUSCULAR | Status: DC | PRN

## 2024-05-01 MED ORDER — FAMOTIDINE IN NACL 20-0.9 MG/50ML-% IV SOLN
20.0000 mg | Freq: Two times a day (BID) | INTRAVENOUS | Status: DC
  Administered 2024-05-01: 20 mg via INTRAVENOUS
  Filled 2024-05-01 (×2): qty 50

## 2024-05-01 MED ORDER — SENNA 8.6 MG PO TABS: 1.0000 | ORAL_TABLET | Freq: Two times a day (BID) | ORAL | Status: DC | PRN

## 2024-05-01 MED ORDER — LACTATED RINGERS IV SOLN: INTRAVENOUS | Status: AC

## 2024-05-01 MED ORDER — SODIUM CHLORIDE 0.9 % IV SOLN
2.0000 g | Freq: Two times a day (BID) | INTRAVENOUS | Status: DC
  Administered 2024-05-01 – 2024-05-02 (×2): 2 g via INTRAVENOUS
  Filled 2024-05-01 (×2): qty 12.5

## 2024-05-01 MED ORDER — MAGNESIUM SULFATE 2 GM/50ML IV SOLN
2.0000 g | Freq: Once | INTRAVENOUS | Status: AC
  Administered 2024-05-01: 2 g via INTRAVENOUS
  Filled 2024-05-01: qty 50

## 2024-05-01 MED ORDER — SODIUM CHLORIDE 0.9 % IV SOLN: 2.0000 g | Freq: Once | INTRAVENOUS | Status: DC

## 2024-05-01 MED ORDER — LACTATED RINGERS IV BOLUS
1000.0000 mL | Freq: Once | INTRAVENOUS | Status: AC
  Administered 2024-05-01: 1000 mL via INTRAVENOUS

## 2024-05-01 MED ORDER — DILTIAZEM HCL-DEXTROSE 125-5 MG/125ML-% IV SOLN (PREMIX): 5.0000 mg/h | INTRAVENOUS | Status: DC

## 2024-05-01 MED ORDER — ONDANSETRON HCL 4 MG/2ML IJ SOLN
4.0000 mg | Freq: Once | INTRAMUSCULAR | Status: AC
  Administered 2024-05-01: 4 mg via INTRAVENOUS
  Filled 2024-05-01: qty 2

## 2024-05-01 MED ORDER — POTASSIUM CHLORIDE 10 MEQ/100ML IV SOLN
INTRAVENOUS | Status: AC
  Filled 2024-05-01: qty 100

## 2024-05-01 MED ORDER — HYDROMORPHONE HCL 1 MG/ML IJ SOLN
0.5000 mg | Freq: Once | INTRAMUSCULAR | Status: AC
  Administered 2024-05-01: 0.5 mg via INTRAVENOUS
  Filled 2024-05-01: qty 1

## 2024-05-01 MED ORDER — POTASSIUM CHLORIDE 10 MEQ/100ML IV SOLN
10.0000 meq | INTRAVENOUS | Status: AC
  Administered 2024-05-01 – 2024-05-02 (×2): 10 meq via INTRAVENOUS
  Filled 2024-05-01 (×2): qty 100

## 2024-05-01 MED ORDER — INSULIN ASPART 100 UNIT/ML IJ SOLN
0.0000 [IU] | INTRAMUSCULAR | Status: DC
  Administered 2024-05-01: 2 [IU] via SUBCUTANEOUS
  Administered 2024-05-01: 3 [IU] via SUBCUTANEOUS
  Administered 2024-05-02 – 2024-05-03 (×2): 2 [IU] via SUBCUTANEOUS
  Administered 2024-05-03: 5 [IU] via SUBCUTANEOUS
  Administered 2024-05-03 (×2): 2 [IU] via SUBCUTANEOUS
  Administered 2024-05-03: 3 [IU] via SUBCUTANEOUS
  Administered 2024-05-03: 5 [IU] via SUBCUTANEOUS
  Administered 2024-05-04 (×4): 3 [IU] via SUBCUTANEOUS
  Administered 2024-05-04: 5 [IU] via SUBCUTANEOUS
  Administered 2024-05-05: 3 [IU] via SUBCUTANEOUS
  Administered 2024-05-05 (×3): 2 [IU] via SUBCUTANEOUS
  Administered 2024-05-05 – 2024-05-06 (×2): 3 [IU] via SUBCUTANEOUS
  Administered 2024-05-06: 5 [IU] via SUBCUTANEOUS
  Administered 2024-05-06: 3 [IU] via SUBCUTANEOUS
  Administered 2024-05-07 (×4): 2 [IU] via SUBCUTANEOUS
  Administered 2024-05-08: 3 [IU] via SUBCUTANEOUS
  Administered 2024-05-08: 5 [IU] via SUBCUTANEOUS
  Administered 2024-05-08 (×2): 2 [IU] via SUBCUTANEOUS
  Administered 2024-05-09 (×4): 3 [IU] via SUBCUTANEOUS
  Administered 2024-05-09: 2 [IU] via SUBCUTANEOUS
  Administered 2024-05-09: 5 [IU] via SUBCUTANEOUS
  Administered 2024-05-10 (×2): 3 [IU] via SUBCUTANEOUS
  Filled 2024-05-01: qty 2
  Filled 2024-05-01: qty 5
  Filled 2024-05-01: qty 3
  Filled 2024-05-01: qty 2
  Filled 2024-05-01: qty 3
  Filled 2024-05-01: qty 2
  Filled 2024-05-01: qty 3
  Filled 2024-05-01: qty 5
  Filled 2024-05-01 (×2): qty 2
  Filled 2024-05-01: qty 5
  Filled 2024-05-01: qty 2
  Filled 2024-05-01 (×2): qty 5
  Filled 2024-05-01 (×2): qty 3
  Filled 2024-05-01: qty 5
  Filled 2024-05-01 (×2): qty 3
  Filled 2024-05-01 (×3): qty 2
  Filled 2024-05-01: qty 3
  Filled 2024-05-01: qty 4
  Filled 2024-05-01: qty 2
  Filled 2024-05-01: qty 3
  Filled 2024-05-01: qty 2
  Filled 2024-05-01: qty 3
  Filled 2024-05-01 (×3): qty 2
  Filled 2024-05-01: qty 3
  Filled 2024-05-01: qty 2
  Filled 2024-05-01 (×3): qty 3
  Filled 2024-05-01 (×2): qty 2
  Filled 2024-05-01: qty 3

## 2024-05-01 MED ORDER — SODIUM CHLORIDE 0.9 % IV SOLN: 250.0000 mL | INTRAVENOUS | Status: AC

## 2024-05-01 MED ORDER — DILTIAZEM LOAD VIA INFUSION
10.0000 mg | Freq: Once | INTRAVENOUS | Status: DC
  Filled 2024-05-01: qty 10

## 2024-05-01 MED ORDER — ORAL CARE MOUTH RINSE: 15.0000 mL | OROMUCOSAL | Status: DC | PRN

## 2024-05-01 MED ORDER — VANCOMYCIN HCL 1500 MG/300ML IV SOLN
1500.0000 mg | Freq: Once | INTRAVENOUS | Status: AC
  Administered 2024-05-01: 1500 mg via INTRAVENOUS
  Filled 2024-05-01: qty 300

## 2024-05-01 MED ORDER — SODIUM CHLORIDE 0.9 % IV SOLN
1.0000 g | INTRAVENOUS | Status: AC
  Administered 2024-05-01: 1 g via INTRAVENOUS
  Filled 2024-05-01: qty 20

## 2024-05-01 MED ORDER — GERHARDT'S BUTT CREAM
TOPICAL_CREAM | Freq: Three times a day (TID) | CUTANEOUS | Status: DC
  Administered 2024-05-02 – 2024-05-06 (×2): 1 via TOPICAL
  Filled 2024-05-01 (×4): qty 60

## 2024-05-01 MED ORDER — IOHEXOL 350 MG/ML SOLN
75.0000 mL | Freq: Once | INTRAVENOUS | Status: AC | PRN
  Administered 2024-05-01: 75 mL via INTRAVENOUS

## 2024-05-01 MED ORDER — ACETAMINOPHEN 650 MG RE SUPP
RECTAL | Status: AC
  Filled 2024-05-01: qty 1

## 2024-05-01 MED ORDER — VANCOMYCIN HCL IN DEXTROSE 1-5 GM/200ML-% IV SOLN: 1000.0000 mg | Freq: Once | INTRAVENOUS | Status: DC

## 2024-05-01 MED ORDER — LACTATED RINGERS IV BOLUS (SEPSIS)
1400.0000 mL | Freq: Once | INTRAVENOUS | Status: AC
  Administered 2024-05-01: 1400 mL via INTRAVENOUS

## 2024-05-01 MED ORDER — POLYETHYLENE GLYCOL 3350 17 G PO PACK: 17.0000 g | PACK | Freq: Every day | ORAL | Status: DC | PRN

## 2024-05-01 MED ORDER — NOREPINEPHRINE 4 MG/250ML-% IV SOLN
0.0000 ug/min | INTRAVENOUS | Status: DC
  Administered 2024-05-01: 2 ug/min via INTRAVENOUS
  Filled 2024-05-01: qty 250

## 2024-05-01 MED ORDER — ACETAMINOPHEN 325 MG PO TABS
650.0000 mg | ORAL_TABLET | Freq: Four times a day (QID) | ORAL | Status: DC | PRN
  Administered 2024-05-02: 650 mg via ORAL
  Filled 2024-05-01: qty 2

## 2024-05-01 MED ORDER — METRONIDAZOLE 500 MG/100ML IV SOLN: 500.0000 mg | Freq: Once | INTRAVENOUS | Status: DC

## 2024-05-01 MED ORDER — ACETAMINOPHEN 325 MG PO TABS
ORAL_TABLET | ORAL | Status: AC
  Filled 2024-05-01: qty 2

## 2024-05-01 MED ORDER — POTASSIUM CHLORIDE 10 MEQ/100ML IV SOLN
10.0000 meq | INTRAVENOUS | Status: AC
  Administered 2024-05-01 (×4): 10 meq via INTRAVENOUS
  Filled 2024-05-01 (×4): qty 100

## 2024-05-01 NOTE — ED Triage Notes (Signed)
 Pt BIB [facility] from [facility] with reports of weakness and poor PO intake for the last 1.5 weeks. Pt recently finished antibiotics for pneumonia.

## 2024-05-01 NOTE — Progress Notes (Signed)
 eLink Physician-Brief Progress Note Patient Name: John [name] DOB: 06/21/1945 MRN: [id number]   Date of Service  05/01/2024  HPI/Events of Note  Patient admitted with metabolic encephalopathy due to suspected sepsis of urinary tract origin. Work-up and antibiotic RX in progress.  eICU Interventions  New Patient Evaluation.        Ziggy Chanthavong U Jaye Saal 05/01/2024, 10:01 PM

## 2024-05-01 NOTE — H&P (Addendum)
 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 He has not eaten in 3 days, because he has not felt like it. Last episode of vomiting was earlier today. He is a very poor historian and wife helps provide history. She states he was recently treated for UTI, just finished antibiotics a couple days ago (Nitrofurantoin for 5 days). She noticed urine in his Foley bag was very dark today (creamy). Foley cath has some blood and less creamy urine. She states she also thinks he had the flu a few weeks ago and has had a persistent cough since. Given 2 L LR, Vanco, Meropenem , and KCL in ED. Developed Afib/RVR and but HR slowed down with fluid boluses. On Eliquis . His NH chart showed DNR/DNI (advanced directive). MAP in ED is 61 mmHg, HR 110 Afib, on RA, SpO2 94%, RR 23.   Pertinent  Medical History  Dementia, dyslipidemia, DM-2, CVA (Oct 2024, left hemiparesis), GERD, BPH, HTN, anemia of chronic illness, Afib, chronic foley cath for obstructive uropathy  Significant Hospital Events: Including procedures, antibiotic start and stop dates in addition to other pertinent events   1/20: ED eval 1/20: ICU admission   Interim History / Subjective:    Objective    Blood pressure (!) 125/59, pulse (!) 120, temperature 99.8 F (37.7 C), temperature source Rectal, resp. rate (!) 23, SpO2 94%.       No intake or output data in the 24 hours ending 05/01/24 2039 There were no vitals filed for this visit.  MAP in ED is 61 mmHg, HR  110 Afib, on RA, SpO2 94%, RR 23.   Examination: General: lethargic, arousable to verbal stimuli briefly. Comfortable. On RA. SpO2 94%  HENT: PERL, dry mucosa oral mucosa. No LNE or thyromegaly. No JVD Lungs: symmetrical air entry bilaterally. No crackles or wheezing Cardiovascular: NL S1/S2. No m/g/r Abdomen: no distension or tenderness Extremities: no edema. Symmetrical  Neuro: left hand weakness and contracted. Symmetrical facial appearance.  Raw skin (redness scrotum and sacral area): stage 1  Resolved problem list   Assessment and Plan  Sepsis due to UTI, less likely LLL PNA or cholecystitis Admit to ICU IVF Serial LA Am Trop TFT I/O chart Cefepime /Vanco MRSA screening Bcx2 Ucx  Lactic acidosis due to severe dehydration (N/V/D, poor oral intake) IVF Serial LA  HypoNa Monitor  HypoK/hypoCa Replace Am labs  Dementia and metabolic encephalopathy  Needs ST eval HOB elevation  Am ammonia level ABG  Dyslipidemia Hold statin  DM-2 Glycemic control HbA1c  GERD H2B   BPH, chronic foley cath for obstructive uropathy Foley cath was changed Apr 29, 2024  HTN Hold BP meds  Anemia of chronic illness Monitor  Chronic Afib with RVR, 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 DNR/DNI  Labs   CBC: Recent Labs  Lab 05/01/24 1540 05/01/24 1620  WBC 17.6*  --   NEUTROABS 15.4*  --   HGB 11.5* 11.6*  HCT 33.6* 34.0*  MCV  83.0  --   PLT 327  --     Basic Metabolic Panel: Recent Labs  Lab 05/01/24 1540 05/01/24 1620  NA 130* 131*  K 2.9* 2.9*  CL 92* 93*  CO2 24  --   GLUCOSE 198* 200*  BUN 27* 28*  CREATININE 1.20 1.20  CALCIUM  8.5*  --    GFR: CrCl cannot be calculated (Unknown ideal weight.). Recent Labs  Lab 05/01/24 1540 05/01/24 1620 05/01/24 1914 05/01/24 1936  WBC 17.6*  --   --   --   LATICACIDVEN  --  2.8* 8.3* 10.1*    Liver Function Tests: Recent  Labs  Lab 05/01/24 1540  AST 28  ALT 13  ALKPHOS 159*  BILITOT 1.4*  PROT 5.9*  ALBUMIN 3.0*   Recent Labs  Lab 05/01/24 1540  LIPASE 16   No results for input(s): AMMONIA in the last 168 hours.  ABG    Component Value Date/Time   HCO3 18.5 (L) 08/06/2023 1738   TCO2 27 05/01/2024 1620   ACIDBASEDEF 5.0 (H) 08/06/2023 1738   O2SAT 93 08/06/2023 1738     Coagulation Profile: No results for input(s): INR, PROTIME in the last 168 hours.  Cardiac Enzymes: No results for input(s): CKTOTAL, CKMB, CKMBINDEX, TROPONINI in the last 168 hours.  HbA1C: Hgb A1c MFr Bld  Date/Time Value Ref Range Status  01/17/2024 05:22 AM 8.5 (H) 4.8 - 5.6 % Final    Comment:    (NOTE) Diagnosis of Diabetes The following HbA1c ranges recommended by the American Diabetes Association (ADA) may be used as an aid in the diagnosis of diabetes mellitus.  Hemoglobin             Suggested A1C NGSP%              Diagnosis  <5.7                   Non Diabetic  5.7-6.4                Pre-Diabetic  >6.4                   Diabetic  <7.0                   Glycemic control for                       adults with diabetes.    08/06/2023 05:13 PM 7.5 (H) 4.8 - 5.6 % Final    Comment:    (NOTE) Pre diabetes:          5.7%-6.4%  Diabetes:              >6.4%  Glycemic control for   <7.0% adults with diabetes     CBG: Recent Labs  Lab 05/01/24 1528  GLUCAP 205*    Review of Systems:   AMS  Past Medical History:  He,  has a past medical history of BPH (benign prostatic hyperplasia), CTS (carpal tunnel syndrome), Depression, Diabetes mellitus, Essential hypertension, GERD (gastroesophageal reflux disease), History of breast cancer, Hypercholesteremia, Neuropathy, and Stroke (HCC).   Surgical History:   Past Surgical History:  Procedure Laterality Date   CATARACT EXTRACTION, BILATERAL  01/18/2017   CORONARY STENT INTERVENTION N/A 08/10/2023   Procedure: CORONARY STENT  INTERVENTION;  Surgeon: Darron Deatrice LABOR, MD;  Location: [facility] INVASIVE CV LAB;  Service: Cardiovascular;  Laterality: N/A;   LEFT HEART CATH AND CORONARY  ANGIOGRAPHY N/A 08/10/2023   Procedure: LEFT HEART CATH AND CORONARY ANGIOGRAPHY;  Surgeon: Darron Deatrice LABOR, MD;  Location: [facility] INVASIVE CV [facility];  Service: Cardiovascular;  Laterality: N/A;   LITHOTRIPSY     MASTECTOMY Right 05/02/2018   PACEMAKER IMPLANT N/A 05/13/2023   Procedure: PACEMAKER IMPLANT;  Surgeon: Kennyth Chew, MD;  Location: [facility] INVASIVE CV LAB;  Service: Cardiovascular;  Laterality: N/A;   TEMPORARY PACEMAKER N/A 05/11/2023   Procedure: TEMPORARY PACEMAKER;  Surgeon: Jordan, Peter M, MD;  Location: [facility] INVASIVE CV LAB;  Service: Cardiovascular;  Laterality: N/A;   TONSILLECTOMY AND ADENOIDECTOMY     TRANSESOPHAGEAL ECHOCARDIOGRAM (CATH LAB) N/A 12/08/2023   Procedure: TRANSESOPHAGEAL ECHOCARDIOGRAM;  Surgeon: Raford Riggs, MD;  Location: [facility] INVASIVE CV LAB;  Service: Cardiovascular;  Laterality: N/A;     Social History:   reports that he has never smoked. He has never used smokeless tobacco. He reports that he does not drink alcohol and does not use drugs.   Family History:  His family history includes Cancer in his mother, paternal grandmother, and sister; Diabetes in his father; Lymphoma in his mother; Ovarian cancer in his sister; Stroke in his father.   Allergies Allergies[1]   Home Medications  Prior to Admission medications  Medication Sig Start Date End Date Taking? Authorizing Provider  aspirin  EC 81 MG tablet Take 1 tablet (81 mg total) by mouth daily. Swallow whole. 01/25/24   Drusilla Sabas RAMAN, MD  atorvastatin  (LIPITOR ) 80 MG tablet Take 1 tablet (80 mg total) by mouth at bedtime. 09/14/23   [name], DO  camphor-menthol  [facility]) lotion Apply topically as needed for itching. 02/20/24   Love, Sharlet RAMAN, PA-C  donepezil  (ARICEPT ) 5 MG tablet Take 1 tablet (5 mg total) by mouth at bedtime. 03/14/24    [name], DO  escitalopram  (LEXAPRO ) 10 MG tablet Take 1 tablet (10 mg total) by mouth daily. 03/14/24   [name], DO  insulin  glargine-yfgn (SEMGLEE ) 100 UNIT/ML injection Inject 0.26 mLs (26 Units total) into the skin daily. 02/21/24   Love, Sharlet RAMAN, PA-C  lidocaine  (LIDODERM ) 5 % Place 2 patches onto the skin daily. Remove & Discard patch within 12 hours or as directed by MD 02/20/24   Love, Sharlet RAMAN, PA-C  magnesium  oxide (MAG-OX) 400 (240 Mg) MG tablet Take 1 tablet (400 mg total) by mouth daily. 03/14/24   [name], DO  mirtazapine  (REMERON ) 7.5 MG tablet Take 1 tablet (7.5 mg total) by mouth at bedtime. 03/14/24   [name], DO  pantoprazole  (PROTONIX ) 40 MG tablet Take 1 tablet (40 mg total) by mouth at bedtime. 03/14/24   [name], DO  polyethylene glycol powder (GLYCOLAX /MIRALAX ) 17 GM/SCOOP powder Take 17 g by mouth daily as needed. Dissolve 1 capful (17g) in 4-8 ounces of liquid and take by mouth daily. 02/20/24   Love, Sharlet RAMAN, PA-C  senna-docusate (SENOKOT-S) 8.6-50 MG tablet Take 1 tablet by mouth at bedtime. 02/20/24   Love, Sharlet RAMAN, PA-C  amLODipine  (NORVASC ) 2.5 MG tablet Take 1 tablet (2.5 mg total) by mouth daily. 06/15/23 06/30/23  [name], DO  apixaban  (ELIQUIS ) 5 MG TABS tablet Take 1 tablet (5 mg total) by mouth 2 (two) times daily. 10/11/23 10/11/23  [name], DO     Critical care time: 60 min     Mancel Ply, MD [facility] Pager: see [email or web address]               [  1]  Allergies Allergen Reactions   Ramipril Anaphylaxis   Chlorhexidine     Adhesive [Tape] Rash   Other Diarrhea    Severe intolerance to Chemotherapy in the past.   Sertraline Other (See Comments)    Extreme headaches   "

## 2024-05-01 NOTE — Progress Notes (Signed)
 ABG obtained. Pt placed on 3L [place] based on Pa02 results. P

## 2024-05-01 NOTE — Plan of Care (Signed)

## 2024-05-01 NOTE — Progress Notes (Signed)
 Elink is following code sepsis.

## 2024-05-01 NOTE — ED Provider Notes (Signed)
 " [place] EMERGENCY DEPARTMENT AT [facility] Provider Note   CSN: [id number] Arrival date & time: 05/01/24  1521     Patient presents with: Weakness   John [name] is a 79 y.o. male.   79 year old male presents for evaluation of weakness.  States he has not eaten in 3 days because he has not felt like it.  States he has had nausea and vomiting as well as diarrhea.  Last episode of vomiting was earlier today.  He is a very poor historian and wife helps provide history.  She states he was recently treated for UTI, just finished antibiotics a couple days ago.  She noticed urine in his Foley bag was very dark today.  She states she also thinks he had the flu a few weeks ago and has had a persistent cough since.  Patient denies any other symptoms or concerns.   Weakness Associated symptoms: cough, diarrhea, nausea and vomiting   Associated symptoms: no abdominal pain, no arthralgias, no chest pain, no dysuria, no fever, no seizures and no shortness of breath        Prior to Admission medications  Medication Sig Start Date End Date Taking? Authorizing Provider  amLODipine  (NORVASC ) 5 MG tablet Take 5 mg by mouth daily. 04/09/24  Yes [provider]  aspirin  EC 81 MG tablet Take 1 tablet (81 mg total) by mouth daily. Swallow whole. 01/25/24  Yes Drusilla Sabas RAMAN, MD  atorvastatin  (LIPITOR ) 80 MG tablet Take 1 tablet (80 mg total) by mouth at bedtime. 09/14/23  Yes [name], DO  donepezil  (ARICEPT ) 5 MG tablet Take 1 tablet (5 mg total) by mouth at bedtime. 03/14/24  Yes [name], DO  escitalopram  (LEXAPRO ) 10 MG tablet Take 1 tablet (10 mg total) by mouth daily. 03/14/24  Yes [name], DO  Insulin  Aspart FlexPen (NOVOLOG ) 100 UNIT/ML Inject 3 Units into the skin 3 (three) times daily before meals. Sliding scale: if BG is 70 - 100= 0 units, 121-150= 1 unit, 151-200= 2 units, 201-250= 3 units, 251-300= 5 units, 301-350= 7 units,  351--400= 9 units, if greater than 400 call NP/PA 04/17/24  Yes [provider]  LANTUS  SOLOSTAR 100 UNIT/ML Solostar Pen Inject 10 Units into the skin in the morning. 03/30/24  Yes [provider]  lidocaine  4 % Place 1 patch onto the skin daily.   Yes [provider]  magnesium  oxide (MAG-OX) 400 (240 Mg) MG tablet Take 1 tablet (400 mg total) by mouth daily. 03/14/24  Yes [name], DO  mirtazapine  (REMERON ) 7.5 MG tablet Take 1 tablet (7.5 mg total) by mouth at bedtime. 03/14/24  Yes [name], DO  pantoprazole  (PROTONIX ) 40 MG tablet Take 1 tablet (40 mg total) by mouth at bedtime. 03/14/24  Yes [name], DO  polyethylene glycol powder (GLYCOLAX /MIRALAX ) 17 GM/SCOOP powder Take 17 g by mouth daily as needed. Dissolve 1 capful (17g) in 4-8 ounces of liquid and take by mouth daily. 02/20/24  Yes Love, Sharlet RAMAN, PA-C  promethazine  (PHENERGAN ) 25 MG/ML injection Inject 12.5-25 mg into the muscle every 12 (twelve) hours as needed for nausea or vomiting. May also give 25mg  into the muscle every 6 hours as needed 04/14/24  Yes [provider]  senna-docusate (SENOKOT-S) 8.6-50 MG tablet Take 1 tablet by mouth at bedtime.   Yes [provider]  tiZANidine (ZANAFLEX) 2 MG tablet Take 2 mg by mouth 2 (two) times daily as  needed for muscle spasms.   Yes [provider]  apixaban  (ELIQUIS ) 5 MG TABS tablet Take 1 tablet (5 mg total) by mouth 2 (two) times daily. 10/11/23 10/11/23  [name], DO    Allergies: Ramipril, Chlorhexidine , Adhesive [tape], Other, and Sertraline    Review of Systems  Constitutional:  Positive for fatigue. Negative for chills and fever.  HENT:  Negative for ear pain and sore throat.   Eyes:  Negative for pain and visual disturbance.  Respiratory:  Positive for cough. Negative for shortness of breath.   Cardiovascular:  Negative for chest pain and palpitations.  Gastrointestinal:   Positive for diarrhea, nausea and vomiting. Negative for abdominal pain.  Genitourinary:  Negative for dysuria and hematuria.  Musculoskeletal:  Negative for arthralgias and back pain.  Skin:  Negative for color change and rash.  Neurological:  Positive for weakness. Negative for seizures and syncope.  All other systems reviewed and are negative.   Updated Vital Signs BP (!) 107/56   Pulse (!) 105   Temp (!) 102.8 F (39.3 C) (Oral)   Resp (!) 26   SpO2 100%   Physical Exam Vitals and nursing note reviewed.  Constitutional:      General: He is not in acute distress.    Appearance: He is well-developed. He is ill-appearing.     Comments: Chronically ill-appearing  HENT:     Head: Normocephalic and atraumatic.     Mouth/Throat:     Mouth: Mucous membranes are dry.  Eyes:     Conjunctiva/sclera: Conjunctivae normal.  Cardiovascular:     Rate and Rhythm: Normal rate and regular rhythm.     Heart sounds: No murmur heard. Pulmonary:     Effort: Pulmonary effort is normal. No respiratory distress.     Breath sounds: Normal breath sounds.  Abdominal:     Palpations: Abdomen is soft.     Tenderness: There is no abdominal tenderness.  Musculoskeletal:        General: No swelling.     Cervical back: Neck supple.  Skin:    General: Skin is warm and dry.     Capillary Refill: Capillary refill takes less than 2 seconds.  Neurological:     Mental Status: He is alert. Mental status is at baseline.  Psychiatric:        Mood and Affect: Mood normal.     (all labs ordered are listed, but only abnormal results are displayed) Labs Reviewed  BASIC METABOLIC PANEL WITH GFR - Abnormal; Notable for the following components:      Result Value   Sodium 130 (*)    Potassium 2.9 (*)    Chloride 92 (*)    Glucose, Bld 198 (*)    BUN 27 (*)    Calcium  8.5 (*)    All other components within normal limits  CBC WITH DIFFERENTIAL/PLATELET - Abnormal; Notable for the following components:    WBC 17.6 (*)    RBC 4.05 (*)    Hemoglobin 11.5 (*)    HCT 33.6 (*)    Neutro Abs 15.4 (*)    Monocytes Absolute 1.2 (*)    Abs Immature Granulocytes 0.20 (*)    All other components within normal limits  HEPATIC FUNCTION PANEL - Abnormal; Notable for the following components:   Total Protein 5.9 (*)    Albumin 3.0 (*)    Alkaline Phosphatase 159 (*)    Total Bilirubin 1.4 (*)    Bilirubin, Direct 0.5 (*)  All other components within normal limits  URINALYSIS, W/ REFLEX TO CULTURE (INFECTION SUSPECTED) - Abnormal; Notable for the following components:   Color, Urine AMBER (*)    APPearance TURBID (*)    pH 9.0 (*)    Hgb urine dipstick MODERATE (*)    Protein, ur 100 (*)    Leukocytes,Ua LARGE (*)    Bacteria, UA RARE (*)    All other components within normal limits  GLUCOSE, CAPILLARY - Abnormal; Notable for the following components:   Glucose-Capillary 153 (*)    All other components within normal limits  CBG MONITORING, ED - Abnormal; Notable for the following components:   Glucose-Capillary 205 (*)    All other components within normal limits  I-STAT CHEM 8, ED - Abnormal; Notable for the following components:   Sodium 131 (*)    Potassium 2.9 (*)    Chloride 93 (*)    BUN 28 (*)    Glucose, Bld 200 (*)    Calcium , Ion 1.03 (*)    Hemoglobin 11.6 (*)    HCT 34.0 (*)    All other components within normal limits  I-STAT CG4 LACTIC ACID, ED - Abnormal; Notable for the following components:   Lactic Acid, Venous 2.8 (*)    All other components within normal limits  I-STAT CG4 LACTIC ACID, ED - Abnormal; Notable for the following components:   Lactic Acid, Venous 8.3 (*)    All other components within normal limits  I-STAT CG4 LACTIC ACID, ED - Abnormal; Notable for the following components:   Lactic Acid, Venous 10.1 (*)    All other components within normal limits  I-STAT ARTERIAL BLOOD GAS, ED - Abnormal; Notable for the following components:   pCO2 arterial 24.3  (*)    pO2, Arterial 57 (*)    Bicarbonate 16.5 (*)    TCO2 17 (*)    Acid-base deficit 6.0 (*)    Sodium 130 (*)    Potassium 2.8 (*)    Calcium , Ion 1.07 (*)    HCT 29.0 (*)    Hemoglobin 9.9 (*)    All other components within normal limits  C DIFFICILE QUICK SCREEN W PCR REFLEX    CULTURE, BLOOD (SINGLE)  URINE CULTURE  MRSA NEXT GEN BY PCR, NASAL  CULTURE, BLOOD (ROUTINE X 2)  CULTURE, BLOOD (ROUTINE X 2)  LIPASE, BLOOD  MAGNESIUM   BLOOD GAS, ARTERIAL  CBC  BASIC METABOLIC PANEL WITH GFR  PHOSPHORUS  MAGNESIUM   LACTIC ACID, PLASMA  TSH  T4, FREE  HEMOGLOBIN A1C  CALCIUM , IONIZED  AMMONIA    EKG: EKG Interpretation Date/Time:  Tuesday May 01 2024 19:22:40 EST Ventricular Rate:  146 PR Interval:  160 QRS Duration:  120 QT Interval:  340 QTC Calculation: 523 R Axis:   -78  Text Interpretation: Atrial fibrillation with rapid V-rate Right bundle branch block Inferior infarct, old Abnormal lateral Q waves Anterior infarct, old Compared with prior EKG from 01/28/2024 Confirmed by Gennaro Bouchard ([id number]) on 05/01/2024 7:35:13 PM  Radiology: CT ABDOMEN PELVIS W CONTRAST Result Date: 05/01/2024 EXAM: CT ABDOMEN AND PELVIS WITH CONTRAST 05/01/2024 07:13:00 PM TECHNIQUE: CT of the abdomen and pelvis was performed with the administration of intravenous contrast. Multiplanar reformatted images are provided for review. Automated exposure control, iterative reconstruction, and/or weight-based adjustment of the mA/kV was utilized to reduce the radiation dose to as low as reasonably achievable. COMPARISON: 01/29/2024 CLINICAL HISTORY: Abd pain, n/v/d FINDINGS: LOWER CHEST: Airspace opacity in the left lower lobe  could reflect atelectasis or pneumonia. LIVER: The liver is unremarkable. GALLBLADDER AND BILE DUCTS: The gallbladder is distended, similar to prior study. Layering gallstones within the gallbladder. Small amount of air noted in the fundus of the gallbladder, question  recent instrumentation or prior sphincterotomy. SPLEEN: Calcifications throughout the spleen compatible with old granulomatous disease. PANCREAS: No acute abnormality. ADRENAL GLANDS: No acute abnormality. KIDNEYS, URETERS AND BLADDER: No stones in the kidneys or ureters. No hydronephrosis. No perinephric or periureteral stranding. The urinary bladder is thick-walled and could be related to cystitis or chronic bladder outlet obstruction. GI AND BOWEL: Stomach demonstrates no acute abnormality. There is no bowel obstruction. PERITONEUM AND RETROPERITONEUM: No ascites. No free air. VASCULATURE: Aortic atherosclerosis. LYMPH NODES: No lymphadenopathy. REPRODUCTIVE ORGANS: Mild enlargement of the prostate. BONES AND SOFT TISSUES: No acute osseous abnormality. No focal soft tissue abnormality. IMPRESSION: 1. Distended gallbladder with layering gallstones and small amount of air in the fundus, possibly related to recent instrumentation or prior sphincterotomy. Recommend clinical correlation . 2. Thick-walled urinary bladder, possibly related to cystitis or chronic bladder outlet obstruction. 3. Airspace opacity in the left lower lobe, atelectasis or pneumonia. Electronically signed by: Franky Crease MD 05/01/2024 07:21 PM EST RP Workstation: [id number]   DG Chest 1 View Result Date: 05/01/2024 CLINICAL DATA:  Shortness of breath and cough. EXAM: CHEST  1 VIEW COMPARISON:  Chest radiograph dated 01/28/2024. FINDINGS: No focal consolidation, pleural effusion, pneumothorax. Stable cardiac silhouette. Left pectoral pacemaker device. No acute osseous pathology. IMPRESSION: No active disease. Electronically Signed   By: Vanetta Chou M.D.   On: 05/01/2024 17:00     .Critical Care  Performed by: Gennaro Duwaine CROME, DO Authorized by: Gennaro Duwaine CROME, DO   Critical care provider statement:    Critical care time (minutes):  75   Critical care time was exclusive of:  Separately billable procedures and treating other  patients and teaching time   Critical care was necessary to treat or prevent imminent or life-threatening deterioration of the following conditions:  Cardiac failure, circulatory failure, sepsis and dehydration   Critical care was time spent personally by me on the following activities:  Development of treatment plan with patient or surrogate, discussions with consultants, evaluation of patient's response to treatment, examination of patient, ordering and review of laboratory studies, ordering and review of radiographic studies, ordering and performing treatments and interventions, pulse oximetry, re-evaluation of patient's condition and review of old charts   Care discussed with: admitting provider      Medications Ordered in the ED  polyethylene glycol (MIRALAX  / GLYCOLAX ) packet 17 g (has no administration in time range)  senna (SENOKOT) tablet 8.6 mg (has no administration in time range)  lactated ringers  infusion (has no administration in time range)  ondansetron  (ZOFRAN ) injection 4 mg (has no administration in time range)  insulin  glargine-yfgn (SEMGLEE ) injection 10 Units (has no administration in time range)  insulin  aspart (novoLOG ) injection 0-15 Units (has no administration in time range)  famotidine  (PEPCID ) IVPB 20 mg premix (has no administration in time range)  ceFEPIme  (MAXIPIME ) 2 g in sodium chloride  0.9 % 100 mL IVPB (has no administration in time range)  potassium chloride  10 mEq in 100 mL IVPB (has no administration in time range)  magnesium  sulfate IVPB 2 g 50 mL (has no administration in time range)  calcium  gluconate 1 g/ 50 mL sodium chloride  IVPB (has no administration in time range)  acetaminophen  (TYLENOL ) tablet 650 mg (650 mg Oral Not Given 05/01/24 2119)  Gerhardt's butt cream (has no administration in time range)  ondansetron  (ZOFRAN ) injection 4 mg (4 mg Intravenous Given 05/01/24 1648)  lactated ringers  bolus 1,000 mL (0 mLs Intravenous Stopped 05/01/24 2127)   potassium chloride  10 mEq in 100 mL IVPB (0 mEq Intravenous Stopped 05/01/24 2104)  HYDROmorphone  (DILAUDID ) injection 0.5 mg (0.5 mg Intravenous Given 05/01/24 1648)  lactated ringers  bolus 1,400 mL (0 mLs Intravenous Stopped 05/01/24 1828)  vancomycin  (VANCOREADY) IVPB 1500 mg/300 mL (0 mg Intravenous Stopped 05/01/24 2050)  meropenem  (MERREM ) 1 g in sodium chloride  0.9 % 100 mL IVPB (0 g Intravenous Stopped 05/01/24 1755)  iohexol  (OMNIPAQUE ) 350 MG/ML injection 75 mL (75 mLs Intravenous Contrast Given 05/01/24 1912)  acetaminophen  (TYLENOL ) 650 MG suppository (  Given 05/01/24 2135)                                    Medical Decision Making Social determinants of health: History of stroke, dementia, poor historian, lives in a nursing home  Cardiac monitor interpretation: A-fib with RVR  Patient here initially tachycardic, but stable blood pressure and vital signs otherwise.  He did develop a fever here and became tachypneic.  Was given a 30 cc/kg bolus of IV fluid as he met sepsis criteria after he also had a leukocytosis and he was started on broad-spectrum antibiotics.  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  Patient CT fairly unremarkable except he does have a urinary tract infection.  Lactate climbed despite IV fluids and ultimately I did speak with the hospitalist but we decided it was better if patient go to the ICU for further care and management.  Discussed with ICU provider and patient will be admitted for further workup and management to the ICU.  Patient also developed A-fib with RVR while he was in the emergency department and a Cardizem  drip was started as well.  Wife and patient updated on plan.  Problems Addressed: Atrial fibrillation with rapid ventricular response (HCC): acute illness or injury that poses a threat to life or bodily functions Sepsis, due to unspecified  organism, unspecified whether acute organ dysfunction present [facility]): acute illness or injury that poses a threat to life or bodily functions Urinary tract infection without hematuria, site unspecified: acute illness or injury that poses a threat to life or bodily functions Vomiting and diarrhea: acute illness or injury  Amount and/or Complexity of Data Reviewed Independent Historian: spouse    Details: Patient has had a stroke and is a poor historian spouse helps provide history.  She states he has been having nausea vomiting and diarrhea for the last 3 days and has not felt like eating anything. External Data Reviewed: notes.    Details: Prior ED records reviewed and patient seen tolerate she is 26 for fall Labs: ordered. Decision-making details documented in ED Course.    Details: Ordered and reviewed by me patient with leukocytosis and elevated lactate, met sepsis criteria.  Lactate continued to climb despite IV fluids and antibiotics Radiology: ordered and independent interpretation performed. Decision-making details documented in ED Course.    Details: Ordered and interpreted by me independently of radiology Chest x-ray: Shows no acute abnormality CT abdomen pelvis shows evidence of UTI and distended bladder, but no acute abnormality ECG/medicine tests: ordered and independent interpretation performed. Decision-making details documented in ED  Course.    Details: Ordered and interpreted by me in the absence of cardiology and shows afib with rvr, no STEMI, or significant change when compared to prior EKG Discussion of management or test interpretation with external provider(s): Dr. Tobie - hospitalist -spoke with him on phone regarding patient's case and recommended admission to ICU as patient's lactate is climbing  Dr. Dub -critical care-I spoke with him on the phone regarding patient's case and he will admit the patient for further workup and management to the ICU  Risk OTC  drugs. Prescription drug management. Parenteral controlled substances. Drug therapy requiring intensive monitoring for toxicity. Decision regarding hospitalization. Diagnosis or treatment significantly limited by social determinants of health. Risk Details: CRITICAL CARE Performed by: Duwaine LITTIE Fusi   Total critical care time: 75 minutes  Critical care time was exclusive of separately billable procedures and treating other patients.  Critical care was necessary to treat or prevent imminent or life-threatening deterioration.  Critical care was time spent personally by me on the following activities: development of treatment plan with patient and/or surrogate as well as nursing, discussions with consultants, evaluation of patient's response to treatment, examination of patient, obtaining history from patient or surrogate, ordering and performing treatments and interventions, ordering and review of laboratory studies, ordering and review of radiographic studies, pulse oximetry and re-evaluation of patient's condition.    Critical Care Total time providing critical care: 75 minutes     Final diagnoses:  Sepsis, due to unspecified organism, unspecified whether acute organ dysfunction present [facility])  Urinary tract infection without hematuria, site unspecified  Vomiting and diarrhea  Atrial fibrillation with rapid ventricular response [facility])    ED Discharge Orders     None          Fusi Duwaine LITTIE, DO 05/01/24 2156  "

## 2024-05-01 NOTE — ED Notes (Signed)
 Foley urine bag changed out, blood noted in tubing, MD made aware for a clean urine sample. Per pt, foley was replaced 2 days ago at facility.

## 2024-05-01 NOTE — Progress Notes (Signed)
 eLink Physician-Brief Progress Note Patient Name: John [name] DOB: November 17, 1945 MRN: [id number]   Date of Service  05/01/2024  HPI/Events of Note  BP 78/48 after 2.4 liters of iv fluids, recent EF on echo is 35 %.   eICU Interventions  Peripheral Levophed  gtt ordered.        Melodye Swor U Elif Yonts 05/01/2024, 11:20 PM

## 2024-05-02 ENCOUNTER — Inpatient Hospital Stay (HOSPITAL_COMMUNITY)

## 2024-05-02 DIAGNOSIS — I4891 Unspecified atrial fibrillation: Secondary | ICD-10-CM | POA: Diagnosis not present

## 2024-05-02 DIAGNOSIS — I5020 Unspecified systolic (congestive) heart failure: Secondary | ICD-10-CM

## 2024-05-02 DIAGNOSIS — E119 Type 2 diabetes mellitus without complications: Secondary | ICD-10-CM

## 2024-05-02 DIAGNOSIS — D649 Anemia, unspecified: Secondary | ICD-10-CM

## 2024-05-02 LAB — LACTIC ACID, PLASMA
Lactic Acid, Venous: 5.4 mmol/L (ref 0.5–1.9)
Lactic Acid, Venous: 4.9 mmol/L (ref 0.5–1.9)
Lactic Acid, Venous: 3.3 mmol/L (ref 0.5–1.9)

## 2024-05-02 LAB — ECHOCARDIOGRAM COMPLETE
AR max vel: 2.38 cm2
AV Area VTI: 2.19 cm2
AV Area mean vel: 2.19 cm2
AV Mean grad: 3 mmHg
AV Peak grad: 5.2 mmHg
Ao pk vel: 1.15 m/s
Area-P 1/2: 3.85 cm2
Height: 72.008 in
MV VTI: 1.56 cm2
S' Lateral: 3.6 cm
Weight: 3181.68 [oz_av]

## 2024-05-02 LAB — HEMOGLOBIN A1C
Hgb A1c MFr Bld: 9.3 % — ABNORMAL HIGH (ref 4.8–5.6)
Mean Plasma Glucose: 220.21 mg/dL

## 2024-05-02 LAB — CBC
HCT: 33.8 % — ABNORMAL LOW (ref 39.0–52.0)
Hemoglobin: 11.3 g/dL — ABNORMAL LOW (ref 13.0–17.0)
MCH: 28.6 pg (ref 26.0–34.0)
MCHC: 33.4 g/dL (ref 30.0–36.0)
MCV: 85.6 fL (ref 80.0–100.0)
Platelets: 289 K/uL (ref 150–400)
RBC: 3.95 MIL/uL — ABNORMAL LOW (ref 4.22–5.81)
RDW: 15.1 % (ref 11.5–15.5)
WBC: 32.8 K/uL — ABNORMAL HIGH (ref 4.0–10.5)
nRBC: 0 % (ref 0.0–0.2)

## 2024-05-02 LAB — GLUCOSE, CAPILLARY
Glucose-Capillary: 107 mg/dL — ABNORMAL HIGH (ref 70–99)
Glucose-Capillary: 144 mg/dL — ABNORMAL HIGH (ref 70–99)
Glucose-Capillary: 145 mg/dL — ABNORMAL HIGH (ref 70–99)
Glucose-Capillary: 71 mg/dL (ref 70–99)
Glucose-Capillary: 87 mg/dL (ref 70–99)
Glucose-Capillary: 88 mg/dL (ref 70–99)

## 2024-05-02 LAB — BASIC METABOLIC PANEL WITH GFR
Anion gap: 18 — ABNORMAL HIGH (ref 5–15)
BUN: 27 mg/dL — ABNORMAL HIGH (ref 8–23)
CO2: 19 mmol/L — ABNORMAL LOW (ref 22–32)
Calcium: 8.5 mg/dL — ABNORMAL LOW (ref 8.9–10.3)
Chloride: 93 mmol/L — ABNORMAL LOW (ref 98–111)
Creatinine, Ser: 1.41 mg/dL — ABNORMAL HIGH (ref 0.61–1.24)
GFR, Estimated: 51 mL/min — ABNORMAL LOW
Glucose, Bld: 145 mg/dL — ABNORMAL HIGH (ref 70–99)
Potassium: 3.3 mmol/L — ABNORMAL LOW (ref 3.5–5.1)
Sodium: 130 mmol/L — ABNORMAL LOW (ref 135–145)

## 2024-05-02 LAB — T4, FREE: Free T4: 1.62 ng/dL (ref 0.80–2.00)

## 2024-05-02 LAB — TSH: TSH: 0.631 u[IU]/mL (ref 0.350–4.500)

## 2024-05-02 LAB — TROPONIN T, HIGH SENSITIVITY: Troponin T High Sensitivity: 54 ng/L — ABNORMAL HIGH (ref 0–19)

## 2024-05-02 LAB — MRSA NEXT GEN BY PCR, NASAL: MRSA by PCR Next Gen: NOT DETECTED

## 2024-05-02 LAB — MAGNESIUM: Magnesium: 1.7 mg/dL (ref 1.7–2.4)

## 2024-05-02 LAB — AMMONIA: Ammonia: 20 umol/L (ref 9–35)

## 2024-05-02 LAB — PHOSPHORUS: Phosphorus: 2.9 mg/dL (ref 2.5–4.6)

## 2024-05-02 MED ORDER — SODIUM CHLORIDE 0.9 % IV SOLN
1.0000 g | Freq: Two times a day (BID) | INTRAVENOUS | Status: DC
  Administered 2024-05-02: 1 g via INTRAVENOUS
  Filled 2024-05-02: qty 20

## 2024-05-02 MED ORDER — ATORVASTATIN CALCIUM 80 MG PO TABS
80.0000 mg | ORAL_TABLET | Freq: Every day | ORAL | Status: DC
  Administered 2024-05-02 – 2024-05-09 (×7): 80 mg via ORAL
  Filled 2024-05-02 (×7): qty 1

## 2024-05-02 MED ORDER — ESCITALOPRAM OXALATE 10 MG PO TABS
10.0000 mg | ORAL_TABLET | Freq: Every day | ORAL | Status: DC
  Administered 2024-05-02 – 2024-05-10 (×8): 10 mg via ORAL
  Filled 2024-05-02 (×9): qty 1

## 2024-05-02 MED ORDER — DONEPEZIL HCL 5 MG PO TABS
5.0000 mg | ORAL_TABLET | Freq: Every day | ORAL | Status: DC
  Administered 2024-05-02 – 2024-05-09 (×7): 5 mg via ORAL
  Filled 2024-05-02 (×7): qty 1

## 2024-05-02 MED ORDER — POTASSIUM CHLORIDE 10 MEQ/100ML IV SOLN
10.0000 meq | INTRAVENOUS | Status: AC
  Administered 2024-05-02 (×6): 10 meq via INTRAVENOUS
  Filled 2024-05-02 (×5): qty 100

## 2024-05-02 MED ORDER — MAGNESIUM SULFATE 2 GM/50ML IV SOLN
2.0000 g | Freq: Once | INTRAVENOUS | Status: AC
  Administered 2024-05-02: 2 g via INTRAVENOUS
  Filled 2024-05-02: qty 50

## 2024-05-02 MED ORDER — ASPIRIN 81 MG PO TBEC
81.0000 mg | DELAYED_RELEASE_TABLET | Freq: Every day | ORAL | Status: DC
  Administered 2024-05-02: 81 mg via ORAL
  Filled 2024-05-02 (×2): qty 1

## 2024-05-02 MED ORDER — NOREPINEPHRINE 4 MG/250ML-% IV SOLN
0.0000 ug/min | INTRAVENOUS | Status: DC
  Administered 2024-05-02 (×2): 10 ug/min via INTRAVENOUS
  Filled 2024-05-02: qty 250

## 2024-05-02 MED ORDER — MIRTAZAPINE 15 MG PO TABS
7.5000 mg | ORAL_TABLET | Freq: Every day | ORAL | Status: DC
  Administered 2024-05-02 – 2024-05-09 (×7): 7.5 mg via ORAL
  Filled 2024-05-02 (×8): qty 1

## 2024-05-02 MED ORDER — FAMOTIDINE IN NACL 20-0.9 MG/50ML-% IV SOLN
20.0000 mg | INTRAVENOUS | Status: DC
  Administered 2024-05-02: 20 mg via INTRAVENOUS
  Filled 2024-05-02: qty 50

## 2024-05-02 MED ORDER — SODIUM CHLORIDE 0.9 % IV SOLN
2.0000 g | Freq: Two times a day (BID) | INTRAVENOUS | Status: DC
  Administered 2024-05-02: 2 g via INTRAVENOUS
  Filled 2024-05-02 (×2): qty 40

## 2024-05-02 MED ORDER — MIDODRINE HCL 5 MG PO TABS: 10.0000 mg | ORAL_TABLET | Freq: Three times a day (TID) | ORAL | Status: DC

## 2024-05-02 MED ORDER — APIXABAN 5 MG PO TABS
5.0000 mg | ORAL_TABLET | Freq: Two times a day (BID) | ORAL | Status: DC
  Administered 2024-05-02 – 2024-05-03 (×3): 5 mg via ORAL
  Filled 2024-05-02 (×3): qty 1

## 2024-05-02 NOTE — Progress Notes (Addendum)
 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 He has not eaten in 3 days, because he has not felt like it. Last episode of vomiting was earlier today. He is a very poor historian and wife helps provide history. She states he was recently treated for UTI, just finished antibiotics a couple days ago (Nitrofurantoin for 5 days). She noticed urine in his Foley bag was very dark today (creamy). Foley cath has some blood and less creamy urine. She states she also thinks he had the flu a few weeks ago and has had a persistent cough since. Given 2 L LR, Vanco, Meropenem , and KCL in ED. Developed Afib/RVR and but HR slowed down with fluid boluses. On Eliquis . His NH chart showed DNR/DNI (advanced directive). MAP in ED is 61 mmHg, HR 110 Afib, on RA, SpO2 94%, RR 23.   Pertinent  Medical History  Dementia, dyslipidemia, DM-2, CVA (Oct 2024, left hemiparesis), GERD, BPH, HTN, anemia of chronic illness, Afib, chronic foley cath for obstructive uropathy  Significant Hospital Events: Including procedures, antibiotic start and stop dates in addition to other pertinent events   1/20: ED eval 1/20: ICU admission for vasopressors  Interim History / Subjective:  Afebrile, HR 70 - 80s, hemodynamics stable off pressors, SPO2 > 90% on 3 L O2 by [place] I/O: + 1 L Drips: levophed  on hold  Objective    Blood pressure (!) 92/54, pulse 78, temperature 97.9 F (36.6 C), temperature source Axillary, resp. rate 17, height 6' 0.01 (1.829 m),  weight 90.2 kg, SpO2 98%.        Intake/Output Summary (Last 24 hours) at 05/02/2024 0855 Last data filed at 05/02/2024 0700 Gross per 24 hour  Intake 2244.2 ml  Output 1150 ml  Net 1094.2 ml   Filed Weights   05/01/24 2200 05/02/24 0418  Weight: 90.2 kg 90.2 kg   Examination: General: awake, alert, follows commands HENT: PERRL, no icterus Lungs: CTAB Cardiovascular: NL S1/S2. No m/g/r Abdomen: no distension or tenderness Extremities: no edema. Symmetrical  Neuro: left hand weakness and contracted. Symmetrical facial appearance.  Raw skin (redness scrotum and sacral area): stage 1  Resolved problem list   Assessment and Plan  Sepsis due to UTI, less likely LLL PNA or cholecystitis - IVF resuscitation - D/C Vancomycin  - Switch Cefepime  to Meropenem  given prior hx of ESBL in the urine cultures - FU culture data and de-escalate  Lactic acidosis due to severe dehydration (N/V/D, poor oral intake) - Trend lactate  HypoNa - CTM  HypoK/hypoMg/HypoCa - Repleted by pharmacy  Dementia and metabolic encephalopathy; patient able to say his name, place, and year. Needs ST eval HOB elevation  Am ammonia level 20 ABG  Gallbladder sludge and distention - RUQ US   DM-2 Glycemic control HbA1c  GERD H2B   BPH, chronic foley cath for obstructive uropathy Foley cath was changed Apr 29, 2024  HTN Hold BP meds  Anemia of chronic illness Monitor  Chronic Afib with RVR, systolic-CHF (August 2025, Echo EF 30%); back in sinus at this  time. - Echo - Eliquis  5 mg BID  13. DNR/DNI  Labs   CBC: Recent Labs  Lab 05/01/24 1540 05/01/24 1620 05/01/24 2049 05/02/24 0401  WBC 17.6*  --   --  32.8*  NEUTROABS 15.4*  --   --   --   HGB 11.5* 11.6* 9.9* 11.3*  HCT 33.6* 34.0* 29.0* 33.8*  MCV 83.0  --   --  85.6  PLT 327  --   --  289    Basic Metabolic Panel: Recent Labs  Lab 05/01/24 1540 05/01/24 1620 05/01/24 2049 05/01/24 2255 05/02/24 0401  NA 130* 131*  130*  --  130*  K 2.9* 2.9* 2.8*  --  3.3*  CL 92* 93*  --   --  93*  CO2 24  --   --   --  19*  GLUCOSE 198* 200*  --   --  145*  BUN 27* 28*  --   --  27*  CREATININE 1.20 1.20  --   --  1.41*  CALCIUM  8.5*  --   --   --  8.5*  MG  --   --   --  1.6* 1.7  PHOS  --   --   --   --  2.9   GFR: Estimated Creatinine Clearance: 48.2 mL/min (A) (by C-G formula based on SCr of 1.41 mg/dL (H)). Recent Labs  Lab 05/01/24 1540 05/01/24 1620 05/01/24 1914 05/01/24 1936 05/02/24 0401  WBC 17.6*  --   --   --  32.8*  LATICACIDVEN  --  2.8* 8.3* 10.1* 5.4*    Liver Function Tests: Recent Labs  Lab 05/01/24 1540  AST 28  ALT 13  ALKPHOS 159*  BILITOT 1.4*  PROT 5.9*  ALBUMIN 3.0*   Recent Labs  Lab 05/01/24 1540  LIPASE 16   Recent Labs  Lab 05/02/24 0401  AMMONIA 20    ABG    Component Value Date/Time   PHART 7.441 05/01/2024 2049   PCO2ART 24.3 (L) 05/01/2024 2049   PO2ART 57 (L) 05/01/2024 2049   HCO3 16.5 (L) 05/01/2024 2049   TCO2 17 (L) 05/01/2024 2049   ACIDBASEDEF 6.0 (H) 05/01/2024 2049   O2SAT 91 05/01/2024 2049     Coagulation Profile: No results for input(s): INR, PROTIME in the last 168 hours.  Cardiac Enzymes: No results for input(s): CKTOTAL, CKMB, CKMBINDEX, TROPONINI in the last 168 hours.  HbA1C: Hgb A1c MFr Bld  Date/Time Value Ref Range Status  05/02/2024 04:01 AM 9.3 (H) 4.8 - 5.6 % Final    Comment:    (NOTE) Diagnosis of Diabetes The following HbA1c ranges recommended by the American Diabetes Association (ADA) may be used as an aid in the diagnosis of diabetes mellitus.  Hemoglobin             Suggested A1C NGSP%              Diagnosis  <5.7                   Non Diabetic  5.7-6.4                Pre-Diabetic  >6.4                   Diabetic  <7.0                   Glycemic control for  adults with diabetes.    01/17/2024 05:22 AM 8.5 (H) 4.8 - 5.6 % Final    Comment:     (NOTE) Diagnosis of Diabetes The following HbA1c ranges recommended by the American Diabetes Association (ADA) may be used as an aid in the diagnosis of diabetes mellitus.  Hemoglobin             Suggested A1C NGSP%              Diagnosis  <5.7                   Non Diabetic  5.7-6.4                Pre-Diabetic  >6.4                   Diabetic  <7.0                   Glycemic control for                       adults with diabetes.      CBG: Recent Labs  Lab 05/01/24 1528 05/01/24 2152 05/01/24 2325 05/02/24 0335 05/02/24 0736  GLUCAP 205* 153* 141* 144* 88    Review of Systems:   AMS  Past Medical History:  He,  has a past medical history of BPH (benign prostatic hyperplasia), CTS (carpal tunnel syndrome), Depression, Diabetes mellitus, Essential hypertension, GERD (gastroesophageal reflux disease), History of breast cancer, Hypercholesteremia, Neuropathy, and Stroke (HCC).   Surgical History:   Past Surgical History:  Procedure Laterality Date   CATARACT EXTRACTION, BILATERAL  01/18/2017   CORONARY STENT INTERVENTION N/A 08/10/2023   Procedure: CORONARY STENT INTERVENTION;  Surgeon: Darron Deatrice LABOR, MD;  Location: [facility] INVASIVE CV LAB;  Service: Cardiovascular;  Laterality: N/A;   LEFT HEART CATH AND CORONARY ANGIOGRAPHY N/A 08/10/2023   Procedure: LEFT HEART CATH AND CORONARY ANGIOGRAPHY;  Surgeon: Darron Deatrice LABOR, MD;  Location: [facility] INVASIVE CV LAB;  Service: Cardiovascular;  Laterality: N/A;   LITHOTRIPSY     MASTECTOMY Right 05/02/2018   PACEMAKER IMPLANT N/A 05/13/2023   Procedure: PACEMAKER IMPLANT;  Surgeon: Kennyth Chew, MD;  Location: [facility] INVASIVE CV LAB;  Service: Cardiovascular;  Laterality: N/A;   TEMPORARY PACEMAKER N/A 05/11/2023   Procedure: TEMPORARY PACEMAKER;  Surgeon: Jordan, Peter M, MD;  Location: [facility] INVASIVE CV LAB;  Service: Cardiovascular;  Laterality: N/A;   TONSILLECTOMY AND ADENOIDECTOMY     TRANSESOPHAGEAL ECHOCARDIOGRAM (CATH LAB)  N/A 12/08/2023   Procedure: TRANSESOPHAGEAL ECHOCARDIOGRAM;  Surgeon: Raford Riggs, MD;  Location: [facility] INVASIVE CV LAB;  Service: Cardiovascular;  Laterality: N/A;     Social History:   reports that he has never smoked. He has never used smokeless tobacco. He reports that he does not drink alcohol and does not use drugs.   Family History:  His family history includes Cancer in his mother, paternal grandmother, and sister; Diabetes in his father; Lymphoma in his mother; Ovarian cancer in his sister; Stroke in his father.   Allergies Allergies[1]   Home Medications  Prior to Admission medications  Medication Sig Start Date End Date Taking? Authorizing Provider  aspirin  EC 81 MG tablet Take 1 tablet (81 mg total) by mouth daily. Swallow whole. 01/25/24   Drusilla Sabas RAMAN, MD  atorvastatin  (LIPITOR ) 80 MG tablet Take 1 tablet (80 mg total) by mouth at bedtime. 09/14/23   [name], DO  camphor-menthol  [facility]) lotion  Apply topically as needed for itching. 02/20/24   Love, Sharlet RAMAN, PA-C  donepezil  (ARICEPT ) 5 MG tablet Take 1 tablet (5 mg total) by mouth at bedtime. 03/14/24   [name], DO  escitalopram  (LEXAPRO ) 10 MG tablet Take 1 tablet (10 mg total) by mouth daily. 03/14/24   [name], DO  insulin  glargine-yfgn (SEMGLEE ) 100 UNIT/ML injection Inject 0.26 mLs (26 Units total) into the skin daily. 02/21/24   Love, Sharlet RAMAN, PA-C  lidocaine  (LIDODERM ) 5 % Place 2 patches onto the skin daily. Remove & Discard patch within 12 hours or as directed by MD 02/20/24   Love, Sharlet RAMAN, PA-C  magnesium  oxide (MAG-OX) 400 (240 Mg) MG tablet Take 1 tablet (400 mg total) by mouth daily. 03/14/24   [name], DO  mirtazapine  (REMERON ) 7.5 MG tablet Take 1 tablet (7.5 mg total) by mouth at bedtime. 03/14/24   [name], DO  pantoprazole  (PROTONIX ) 40 MG tablet Take 1 tablet (40 mg total) by mouth at bedtime. 03/14/24   [name],  DO  polyethylene glycol powder (GLYCOLAX /MIRALAX ) 17 GM/SCOOP powder Take 17 g by mouth daily as needed. Dissolve 1 capful (17g) in 4-8 ounces of liquid and take by mouth daily. 02/20/24   Love, Sharlet RAMAN, PA-C  senna-docusate (SENOKOT-S) 8.6-50 MG tablet Take 1 tablet by mouth at bedtime. 02/20/24   Love, Sharlet RAMAN, PA-C  amLODipine  (NORVASC ) 2.5 MG tablet Take 1 tablet (2.5 mg total) by mouth daily. 06/15/23 06/30/23  [name], DO  apixaban  (ELIQUIS ) 5 MG TABS tablet Take 1 tablet (5 mg total) by mouth 2 (two) times daily. 10/11/23 10/11/23  [name], DO     I have spent 55 minutes evaluating patient, reviewing chart, and discussing plan of care with patient, family, pharmacist on round and primary medical team.  Paula Southerly, MD [facility]                 [1]  Allergies Allergen Reactions   Ramipril Anaphylaxis   Chlorhexidine     Adhesive [Tape] Rash   Other Diarrhea    Severe intolerance to Chemotherapy in the past.   Sertraline Other (See Comments)    Extreme headaches   "

## 2024-05-02 NOTE — Progress Notes (Signed)
 [facility] ADULT ICU REPLACEMENT PROTOCOL   The patient does apply for the [facility] Adult ICU Electrolyte Replacment Protocol based on the criteria listed below:   1.Exclusion criteria: TCTS, ECMO, Dialysis, and Myasthenia Gravis patients 2. Is GFR >/= 30 ml/min? Yes.    Patient's GFR today is 51 3. Is SCr </= 2? Yes.   Patient's SCr is 1.41 mg/dL 4. Did SCr increase >/= 0.5 in 24 hours? No. 5.Pt's weight >40kg  Yes.   6. Abnormal electrolyte(s): potassium 3.3, mag 1.7  7. Electrolytes replaced per protocol 8.  Call MD STAT for K+ </= 2.5, Phos </= 1, or Mag </= 1 Physician:  protocol  Claretta JINNY Sharps 05/02/2024 5:17 AM

## 2024-05-02 NOTE — TOC Initial Note (Signed)
 Transition of Care [facility]) - Initial/Assessment Note    Patient Details  Name: John [name] MRN: [id number] Date of Birth: [birth year]  [facility]) CM/SW Contact:    Lendia Dais, LCSWA Phone Number: 05/02/2024, 2:39 PM  Clinical Narrative:  Pt is from [facility]. CSW spoke to pt's spouse via phone [name] who inquired about other facilities closer to the [place] area. CSW stated they can send out referrals for other facilities once the pt medically progresses, but if no bed offers are received by the time of medically stability, the pt would have to return to [facility]. [name] stated understanding. CSW informed [name] that she could also refer to the social worker at [facility] to transfer.  CSW spoke to Darrien of [facility] who stated that the pt can return but will depend on when a bed is available due to the family being unable to hold a bed.  CSW left a medicare.gov list at bedside.                  Expected Discharge Plan: Skilled Nursing Facility Barriers to Discharge: Continued Medical Work up   Patient Goals and CMS Choice Patient states their goals for this hospitalization and ongoing recovery are:: pt oriented x1 CMS Medicare.gov Compare Post Acute Care list provided to:: Patient Represenative (must comment) (Spouse) Choice offered to / list presented to : Spouse      Expected Discharge Plan and Services In-house Referral: Clinical Social Work     Living arrangements for the past 2 months: Skilled Nursing Facility                                      Prior Living Arrangements/Services Living arrangements for the past 2 months: Skilled Nursing Facility Lives with:: Facility Resident Patient language and need for interpreter reviewed:: No Do you feel safe going back to the place where you live?:  (oriented x1)      Need for Family Participation in Patient Care: No (Comment) Care giver support system in place?: No (comment) Current home  services: DME (walker/shower seat) Criminal Activity/Legal Involvement Pertinent to Current Situation/Hospitalization: No - Comment as needed  Activities of Daily Living   ADL Screening (condition at time of admission) Independently performs ADLs?: No Does the patient have a NEW difficulty with bathing/dressing/toileting/self-feeding that is expected to last >3 days?: No Does the patient have a NEW difficulty with getting in/out of bed, walking, or climbing stairs that is expected to last >3 days?: No Does the patient have a NEW difficulty with communication that is expected to last >3 days?: No Is the patient deaf or have difficulty hearing?: No Does the patient have difficulty seeing, even when wearing glasses/contacts?: No Does the patient have difficulty concentrating, remembering, or making decisions?: No  Permission Sought/Granted Permission sought to share information with : Facility Medical Sales Representative, Family Supports Permission granted to share information with : No  Share Information with NAME: [name]  Permission granted to share info w AGENCY: [facility]  Permission granted to share info w Relationship: Spouse  Permission granted to share info w Contact Information: [phone]  Emotional Assessment Appearance:: Appears stated age Attitude/Demeanor/Rapport: Unable to Assess Affect (typically observed): Unable to Assess Orientation: : Oriented to Self Alcohol / Substance Use: Not Applicable    Admission diagnosis:  Atrial fibrillation with rapid ventricular response (HCC) [I48.91] Vomiting and diarrhea [  R11.10, R19.7] Sepsis (HCC) [A41.9] Urinary tract infection without hematuria, site unspecified [N39.0] Sepsis, due to unspecified organism, unspecified whether acute organ dysfunction present [facility]) [A41.9] Patient Active Problem List   Diagnosis Date Noted   CVA (cerebral vascular accident) (HCC) 02/03/2024   Prerenal azotemia 01/28/2024    Unresponsive episode 01/28/2024   Bacterial UTI 01/28/2024   Right pontine stroke (HCC) 01/24/2024   Acute cystitis without hematuria 01/17/2024   History of CVA (cerebrovascular accident) 01/17/2024   Weakness 01/17/2024   Left-sided weakness 01/16/2024   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) 01/03/2024   Atherosclerosis of coronary artery without angina pectoris 01/02/2024   Cognitive communication disorder 01/02/2024   Late effects of cerebrovascular disease 01/02/2024   Muscle weakness 01/02/2024   Status post coronary angioplasty 01/02/2024   Urinary tract obstruction 01/02/2024   Intractable nausea and vomiting 12/26/2023   Constipation 12/26/2023   AMS (altered mental status) 12/13/2023   Altered mental status 12/09/2023   Nausea & vomiting 12/09/2023   Status post placement of cardiac pacemaker 12/09/2023   Sepsis (HCC) 12/04/2023   CAD S/P percutaneous coronary angioplasty 12/04/2023   Microcytic hypochromic anemia 10/11/2023   HFrEF (heart failure with reduced ejection fraction) (HCC) 08/17/2023   DVT, femoral, chronic (HCC) 08/15/2023   Bladder outlet obstruction 08/15/2023   Sepsis due to Escherichia coli (HCC) 08/15/2023   Debility 08/13/2023   Non-ST elevation (NSTEMI) myocardial infarction (HCC) 08/10/2023   Cholelithiasis 08/07/2023   VTE (venous thromboembolism) 08/07/2023   Lactic acidosis 08/07/2023   Sacral pressure ulcer 08/07/2023   Streptococcal bacteremia 08/07/2023   B12 deficiency 08/07/2023   Iron deficiency anemia 08/07/2023   Severe sepsis (HCC) 08/06/2023   Chronic diastolic CHF (congestive heart failure) (HCC) 08/06/2023   Bacteriuria 08/06/2023   Acute encephalopathy 08/06/2023   Other pulmonary embolism without acute cor pulmonale (HCC) 06/16/2023   Essential hypertension 06/15/2023   Situational depression 06/15/2023   Protein-calorie malnutrition, severe 05/12/2023   Heart block AV complete (HCC)  05/12/2023   Syncope 05/10/2023   Fall at home, initial encounter 05/08/2023   Hypotension 05/08/2023   Abnormal LFTs 05/08/2023   Anemia 05/08/2023   Coping style affecting medical condition 05/02/2023   Acute renal failure superimposed on chronic kidney disease 04/28/2023   Brainstem infarct, acute (HCC) 04/25/2023   Pressure injury of skin 04/25/2023   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   Acute kidney injury superimposed on chronic kidney disease 04/17/2023   DKA (diabetic ketoacidosis) (HCC) 04/16/2023   Type 2 diabetes mellitus with hyperglycemia, with long-term current use of insulin  (HCC) 07/26/2022   Bronchitis 03/20/2021   Acute non-recurrent frontal sinusitis 03/20/2021   Viral URI with cough 01/13/2021   Hyperlipidemia 11/21/2020   Dizziness and giddiness 10/20/2020   Localized swelling of both lower extremities 10/20/2020   Tendinitis of right hip flexor 10/20/2020   Aortic atherosclerosis 09/10/2020   Insomnia 09/10/2020   Cognitive impairment 08/18/2020   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   Urinary retention 06/04/2020   History of COVID-19 12/10/2019   Physical deconditioning 12/10/2019   Fatigue 12/10/2019   Unsteady gait 12/10/2019   Pneumonia due to COVID-19 virus 10/26/2019   Peripheral neuropathy 10/17/2019   Depression, major, single episode, complete remission 10/17/2019   History of breast cancer    GERD (gastroesophageal reflux disease)    BPH (benign prostatic hyperplasia)    CTS (carpal tunnel syndrome)    Diabetic polyneuropathy associated with type 2 diabetes mellitus (HCC)  09/29/2019   Neuropathic pain of foot 09/29/2019   Foley catheter in place 01/03/2019   Peripheral sensory neuropathy 05/17/2018   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   Hypomagnesemia 01/18/2018   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) 12/13/2017   Axillary adenopathy 12/09/2017   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   S/P cataract extraction and insertion of intraocular lens 01/17/2017   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   PVD (posterior vitreous detachment), left 01/07/2017   Hyperopia of both eyes with astigmatism 11/09/2016   Class 1 obesity due to excess calories without serious comorbidity with body mass index (BMI) of 31.0 to 31.9 in adult 09/17/2016   Encounter for monitoring tamoxifen  therapy 03/05/2016   Insulin  dependent type 2 diabetes mellitus (HCC) 07/02/2015   Diabetes 1.5, 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 [place] [address] Phone: [phone] Fax: [phone]     Social Drivers of Health (SDOH) Social History: SDOH Screenings   Food Insecurity: Patient Unable To Answer (05/01/2024)  Housing: Unknown (05/01/2024)  Transportation Needs: Patient Unable To Answer (05/01/2024)  Utilities: Patient Unable To Answer (05/01/2024)  Alcohol Screen: Low Risk (01/27/2023)  Depression (PHQ2-9): Low Risk (01/28/2023)  Financial Resource Strain: Low Risk (10/03/2023)  Physical Activity: Inactive (10/03/2023)  Social Connections: Patient Unable To Answer (05/01/2024)  Stress: No Stress Concern Present (10/03/2023)  Tobacco Use: Low Risk (05/01/2024)   SDOH Interventions:     Readmission Risk Interventions    12/06/2023    4:03 PM 05/11/2023   12:52 PM 04/22/2023   12:12 PM  Readmission Risk Prevention Plan  Medication Screening   Complete  Transportation Screening Complete Complete Complete  PCP or Specialist Appt within 3-5 Days  Complete   HRI or Home Care Consult  Complete   Social Work Consult for Recovery Care Planning/Counseling  Complete    Palliative Care Screening  Not Applicable   Medication Review Oceanographer) Complete Complete   HRI or Home Care Consult Complete    SW Recovery Care/Counseling Consult Complete    Palliative Care Screening Not Applicable    Skilled Nursing Facility Not Applicable

## 2024-05-02 NOTE — Progress Notes (Signed)
 eLink Physician-Brief Progress Note Patient Name: John [name] DOB: 1946/03/08 MRN: [id number]   Date of Service  05/02/2024  HPI/Events of Note  BP 80/50  eICU Interventions  Given DNR / DNI status ground crew feels that deferring central line is appropriate. Will temporarily increase ceiling on peripheral Levo gtt to 20 mcg.        [name] [name] 05/02/2024, 2:10 AM

## 2024-05-02 NOTE — Evaluation (Signed)
 Clinical/Bedside Swallow Evaluation Patient Details  Name: John [name] MRN: [id number] Date of Birth: 1946/01/23  Today's Date: 05/02/2024 Time: SLP Start Time (ACUTE ONLY): 9076 SLP Stop Time (ACUTE ONLY): 0940 SLP Time Calculation (min) (ACUTE ONLY): 17 min  Past Medical History:  Past Medical History:  Diagnosis Date   BPH (benign prostatic hyperplasia)    CTS (carpal tunnel syndrome)    Depression    Diabetes mellitus    Essential hypertension    GERD (gastroesophageal reflux disease)    History of breast cancer    Hypercholesteremia    Neuropathy    Stroke [facility])    Past Surgical History:  Past Surgical History:  Procedure Laterality Date   CATARACT EXTRACTION, BILATERAL  01/18/2017   CORONARY STENT INTERVENTION N/A 08/10/2023   Procedure: CORONARY STENT INTERVENTION;  Surgeon: Darron Deatrice LABOR, MD;  Location: [facility] INVASIVE CV LAB;  Service: Cardiovascular;  Laterality: N/A;   LEFT HEART CATH AND CORONARY ANGIOGRAPHY N/A 08/10/2023   Procedure: LEFT HEART CATH AND CORONARY ANGIOGRAPHY;  Surgeon: Darron Deatrice LABOR, MD;  Location: [facility] INVASIVE CV LAB;  Service: Cardiovascular;  Laterality: N/A;   LITHOTRIPSY     MASTECTOMY Right 05/02/2018   PACEMAKER IMPLANT N/A 05/13/2023   Procedure: PACEMAKER IMPLANT;  Surgeon: Kennyth Chew, MD;  Location: [facility] INVASIVE CV LAB;  Service: Cardiovascular;  Laterality: N/A;   TEMPORARY PACEMAKER N/A 05/11/2023   Procedure: TEMPORARY PACEMAKER;  Surgeon: Jordan, Peter M, MD;  Location: [facility] INVASIVE CV LAB;  Service: Cardiovascular;  Laterality: N/A;   TONSILLECTOMY AND ADENOIDECTOMY     TRANSESOPHAGEAL ECHOCARDIOGRAM (CATH LAB) N/A 12/08/2023   Procedure: TRANSESOPHAGEAL ECHOCARDIOGRAM;  Surgeon: Raford Riggs, MD;  Location: [facility] INVASIVE CV LAB;  Service: Cardiovascular;  Laterality: N/A;   HPI:  [name] is a 79 y.o. male who presented to the hospital 05/01/24 from SNF for evaluation of nausea, vomiting, diarrhea and general weakness.  He reportedly has not eaten in three days and this is apparently because he does not like the food. He was recently treated for a UTI and has had a persistent cough since having suspected flu per spouse. CXR did not indicate any focal consolidation. He was admitted with sepsis due to UTI (less likely LLL PNA or cholecystitis). He was admitted to the ICU, kept NPO awaiting SLP swallow evaluation. 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    Assessment / Plan / Recommendation  Clinical Impression  SLP recommending intiate Dys 3(mechanical soft) solids, thin liquids and will f/u at least one time to ensure toleration and determine if he could advance to regular solids.  Patient presents with clinical s/s of what appears to be a primary oral phase dysphagia. Swallow assessed via PO's of thin liquids and regular solids. Patient able to feed self after setup assistance. No overt s/s aspiration with consecutive straw sips of water during or after PO intake. Voice remained clear. Patient with prolonged oral phase when eating graham crackers, c/b prolonged mastication and delayed anterior to posterior transit of boluses.  SLP Visit Diagnosis: Dysphagia, oral phase (R13.11)    Aspiration Risk  Mild aspiration risk    Diet Recommendation Dysphagia 3 (Mech soft);Thin liquid    Liquid Administration via: Cup;Straw Medication Administration: Other (Comment) (as tolerated) Supervision: Patient able to self feed;Full supervision/cueing for  compensatory strategies Compensations: Slow rate;Small sips/bites Postural Changes: Seated upright at 90 degrees    Other Recommendations Oral Care Recommendations: Oral care BID     Swallow Evaluation Recommendations     Assistance Recommended at  Discharge    Functional Status Assessment Patient has had a recent decline in their functional status and demonstrates the ability to make significant improvements in function in a reasonable and predictable amount of time.  Frequency and Duration min 1 x/week  1 week       Prognosis Prognosis for improved oropharyngeal function: Fair Barriers to Reach Goals: Cognitive deficits      Swallow Study   General Date of Onset: 05/01/24 HPI: [name] is a 79 y.o. male who presented to the hospital 05/01/24 from SNF for evaluation of nausea, vomiting, diarrhea and general weakness. He reportedly has not eaten in three days and this is apparently because he does not like the food. He was recently treated for a UTI and has had a persistent cough since having suspected flu per spouse. CXR did not indicate any focal consolidation. He was admitted with sepsis due to UTI (less likely LLL PNA or cholecystitis). He was admitted to the ICU, kept NPO awaiting SLP swallow evaluation. 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 Type of Study: Bedside Swallow Evaluation Previous Swallow Assessment: during previous admission following CVA in 2025 Diet Prior to this Study: NPO Temperature Spikes Noted: No Respiratory Status: Nasal cannula History of Recent Intubation: No Behavior/Cognition: Alert;Cooperative;Pleasant mood;Confused Oral Cavity Assessment: Within Functional Limits Oral Care Completed by SLP: Yes Oral Cavity - Dentition: Adequate natural dentition Vision: Functional for self-feeding Self-Feeding Abilities: Able to feed self;Needs set up Patient Positioning: Upright in bed Baseline Vocal Quality: Normal Volitional Cough: Cognitively unable to elicit Volitional Swallow: Unable  to elicit    Oral/Motor/Sensory Function Overall Oral Motor/Sensory Function: Generalized oral weakness   Ice Chips     Thin Liquid Thin Liquid: Within functional limits Presentation: Straw;Self Fed    Nectar Thick     Honey Thick     Puree Puree: Not tested   Solid     Solid: Impaired Presentation: Self Fed Oral Phase Impairments: Impaired mastication Oral Phase Functional Implications: Prolonged oral transit      Norleen IVAR Blase, MA, CCC-SLP Speech Therapy  05/02/2024,11:14 AM

## 2024-05-03 ENCOUNTER — Inpatient Hospital Stay (HOSPITAL_COMMUNITY)

## 2024-05-03 DIAGNOSIS — A409 Streptococcal sepsis, unspecified: Secondary | ICD-10-CM

## 2024-05-03 LAB — GLUCOSE, CAPILLARY
Glucose-Capillary: 124 mg/dL — ABNORMAL HIGH (ref 70–99)
Glucose-Capillary: 132 mg/dL — ABNORMAL HIGH (ref 70–99)
Glucose-Capillary: 178 mg/dL — ABNORMAL HIGH (ref 70–99)
Glucose-Capillary: 212 mg/dL — ABNORMAL HIGH (ref 70–99)
Glucose-Capillary: 202 mg/dL — ABNORMAL HIGH (ref 70–99)

## 2024-05-03 LAB — BASIC METABOLIC PANEL WITH GFR
Anion gap: 10 (ref 5–15)
BUN: 23 mg/dL (ref 8–23)
CO2: 24 mmol/L (ref 22–32)
Calcium: 8.9 mg/dL (ref 8.9–10.3)
Chloride: 101 mmol/L (ref 98–111)
Creatinine, Ser: 0.9 mg/dL (ref 0.61–1.24)
GFR, Estimated: >60 mL/min
Glucose, Bld: 140 mg/dL — ABNORMAL HIGH (ref 70–99)
Potassium: 3.4 mmol/L — ABNORMAL LOW (ref 3.5–5.1)
Sodium: 135 mmol/L (ref 135–145)

## 2024-05-03 LAB — MAGNESIUM: Magnesium: 2.3 mg/dL (ref 1.7–2.4)

## 2024-05-03 LAB — CALCIUM, IONIZED: Calcium, Ionized, Serum: 4.6 mg/dL (ref 4.5–5.6)

## 2024-05-03 LAB — CBC
HCT: 34.5 % — ABNORMAL LOW (ref 39.0–52.0)
Hemoglobin: 11.6 g/dL — ABNORMAL LOW (ref 13.0–17.0)
MCH: 28.2 pg (ref 26.0–34.0)
MCHC: 33.6 g/dL (ref 30.0–36.0)
MCV: 83.9 fL (ref 80.0–100.0)
Platelets: 209 K/uL (ref 150–400)
RBC: 4.11 MIL/uL — ABNORMAL LOW (ref 4.22–5.81)
RDW: 15.3 % (ref 11.5–15.5)
WBC: 22.8 K/uL — ABNORMAL HIGH (ref 4.0–10.5)
nRBC: 0 % (ref 0.0–0.2)

## 2024-05-03 MED ORDER — PIPERACILLIN-TAZOBACTAM 3.375 G IVPB 30 MIN
3.3750 g | Freq: Once | INTRAVENOUS | Status: AC
  Administered 2024-05-03: 3.375 g via INTRAVENOUS
  Filled 2024-05-03: qty 50

## 2024-05-03 MED ORDER — PANTOPRAZOLE SODIUM 40 MG PO TBEC
40.0000 mg | DELAYED_RELEASE_TABLET | Freq: Every day | ORAL | Status: DC
  Administered 2024-05-04 – 2024-05-10 (×7): 40 mg via ORAL
  Filled 2024-05-03 (×7): qty 1

## 2024-05-03 MED ORDER — TECHNETIUM TC 99M MEBROFENIN IV KIT
5.0000 | PACK | Freq: Once | INTRAVENOUS | Status: AC
  Administered 2024-05-03: 5.4 via INTRAVENOUS

## 2024-05-03 MED ORDER — HEPARIN (PORCINE) 25000 UT/250ML-% IV SOLN
1250.0000 [IU]/h | INTRAVENOUS | Status: DC
  Administered 2024-05-03: 1300 [IU]/h via INTRAVENOUS
  Filled 2024-05-03: qty 250

## 2024-05-03 MED ORDER — SODIUM CHLORIDE 0.9 % IV SOLN
1.0000 g | Freq: Four times a day (QID) | INTRAVENOUS | Status: DC
  Administered 2024-05-03: 1 g via INTRAVENOUS
  Filled 2024-05-03 (×2): qty 1000

## 2024-05-03 MED ORDER — POTASSIUM CHLORIDE CRYS ER 20 MEQ PO TBCR
40.0000 meq | EXTENDED_RELEASE_TABLET | ORAL | Status: DC
  Administered 2024-05-03: 40 meq via ORAL
  Filled 2024-05-03: qty 2

## 2024-05-03 MED ORDER — KCL IN DEXTROSE-NACL 20-5-0.9 MEQ/L-%-% IV SOLN
INTRAVENOUS | Status: AC
  Filled 2024-05-03 (×2): qty 1000

## 2024-05-03 MED ORDER — MORPHINE SULFATE (PF) 2 MG/ML IV SOLN
3.0000 mg | Freq: Once | INTRAVENOUS | Status: AC
  Administered 2024-05-03: 3 mg via INTRAVENOUS

## 2024-05-03 MED ORDER — MORPHINE SULFATE (PF) 2 MG/ML IV SOLN
INTRAVENOUS | Status: AC
  Filled 2024-05-03: qty 2

## 2024-05-03 MED ORDER — OXYCODONE HCL 5 MG PO TABS
5.0000 mg | ORAL_TABLET | Freq: Four times a day (QID) | ORAL | Status: AC | PRN
  Administered 2024-05-03: 5 mg via ORAL
  Filled 2024-05-03 (×3): qty 1

## 2024-05-03 MED ORDER — PIPERACILLIN-TAZOBACTAM 3.375 G IVPB
3.3750 g | Freq: Three times a day (TID) | INTRAVENOUS | Status: AC
  Administered 2024-05-03 – 2024-05-08 (×16): 3.375 g via INTRAVENOUS
  Filled 2024-05-03 (×16): qty 50

## 2024-05-03 NOTE — Progress Notes (Signed)
" °  Progress Note   Patient: John [name] FMW:[id number] DOB: [birth year] DOA: 05/01/2024     2 DOS: the patient was seen and examined on 05/03/2024 at 11:20AM      Brief hospital course: 79 y.o. M with sCHF EF 35-40%, CAD s/p PCI Apr 2025, DM, recurrent UTI and indwelling foley, streptococcal bacteremia last Sep, Atrial fibrillation and SSS and heart block s/p PPM, stroke with residual left weakness, history breast cancer, hx DVT, and dementia lives at home who presented with weakness and poor PO intake.  In the ER, noted to have gross hematuria and purulent urine in bag, hypotension and rapid Afib so admitted to ICU on pressors and antibiotics.     Assessment and Plan: Septic shock, unclear source Presented with tachycardia, leukocytosis, encephalopathy, lactic acid 5.4, AKI, and hypotension requiring rpessors.  Maybe UTI, maybe cholecystitis  Urine culture growing VRE Blood cultures NGTD - Continue ampicillin   - Continue meropenem  - Follow HIDA scan   Acute metabolic encephalopathy Dementia At baseline patient has progresive memory loss but lived at home.  Has been in and out of rehab in last few months, most recently at [facility] for STR.  Currently he is oriented to self only, which is worse that baseline.  This is due to sepsis and should resolve - Delirium precautions -Continue donepezil , Lexapro , Remeron   Coronary artery disease Cerebrovascular disease Hypertension Chronic systolic congestive heart failure Appears euvolemic to hypovolemic, blood pressure soft - Hold amlodipine  - Continue aspirin , Eliquis , Lipitor   Diabetes Glucose controlled - Hold Lantus  - Continue sliding scale corrections  Chronic indwelling Foley Foley changed 2 days prior to admission at facility  Chronic atrial fibrillation Sick sinus syndrome History of pacemaker Rate improved with fluids and treatment of sepsis, not on rate control medication at baseline - Continue  Eliquis   Hyponatremia Improved to normal with fluids  Hypokalemia Mag normal - Supplement K  AKI Cr 1.4 on admission, improved to 0.9 today.  Anemia Hgb stable, no bleeding        Subjective: Patient is still sluggish.  Nursing report no new concerns, no fever overnight, no respiratory symptoms.  No vomiting.     Physical Exam: BP 130/64 (BP Location: Left Arm)   Pulse 80   Temp 98.9 F (37.2 C) (Oral)   Resp 20   Ht 6' 0.01 (1.829 m)   Wt 90.2 kg   SpO2 94%   BMI 26.96 kg/m   Elderly adult male, appears sluggish and weak Regular rate, irregular rhythm, no murmurs, no peripheral edema Respiratory rate normal, lungs clear without rales or wheezes Abdomen soft, grimace to palpation in the right upper quadrant, voluntary guarding throughout, no rigidity Attention diminished, affect blunted, face symmetric, speech slurred, oriented to self only, generalized weakness, worse in the left arm    Data Reviewed: Basic metabolic panel shows mild hypokalemia, CBC shows improving leukocytosis, stable anemia      Family Communication: None at the bedside    Disposition: Status is: Inpatient         Author: Lonni SHAUNNA Dalton, MD 05/03/2024 3:21 PM  For on call review [email or web address].    "

## 2024-05-03 NOTE — Hospital Course (Addendum)
 79 y.o. M with sCHF EF 35-40%, CAD s/p PCI Apr 2025, DM, recurrent UTI and indwelling foley, streptococcal bacteremia last Sep, Atrial fibrillation and SSS and heart block s/p PPM, stroke with residual left weakness, history breast cancer, hx DVT, and dementia lives at home who presented with weakness and poor PO intake.  In the ER, noted to have gross hematuria and purulent urine in bag, hypotension and rapid Afib so admitted to ICU on pressors and antibiotics.

## 2024-05-03 NOTE — Progress Notes (Signed)
 Pharmacy Consult for Heparin   Indication: atrial fibrillation   Allergies[1]  Patient Measurements: Height: 6' 0.01 (182.9 cm) Weight: 90.2 kg (198 lb 13.7 oz) IBW/kg (Calculated) : 77.62 HEPARIN  DW (KG): 90.2  Vital Signs: Temp: 98.2 F (36.8 C) (01/22 1532) Temp Source: Oral (01/22 1532) BP: 138/83 (01/22 1600) Pulse Rate: 80 (01/22 1000)  Labs: Recent Labs    05/01/24 1540 05/01/24 1620 05/01/24 2049 05/02/24 0401 05/03/24 0650  HGB 11.5* 11.6* 9.9* 11.3* 11.6*  HCT 33.6* 34.0* 29.0* 33.8* 34.5*  PLT 327  --   --  289 209  CREATININE 1.20 1.20  --  1.41* 0.90    Estimated Creatinine Clearance: 75.4 mL/min (by C-G formula based on SCr of 0.9 mg/dL).  Assessment: John [name] a 79 y.o. male presented with sepsis, on apixaban  for atrial fibrillation. LD 1/22 1144.  Pharmacy has been consulted for transition from apixaban >>heparin  dosing in anticipation of OR for IAI. Hgb 11.6, PLT 209.    Goal of Therapy:  Heparin  level 0.3-0.7 units/ml Monitor platelets by anticoagulation protocol: Yes   Plan:  START heparin  infusion at 1300 units/hour- start infusion at 2300 8-hour aPTT following start of infusion  Patient will require HL/aPTT monitoring until correlating, daily CBC  F/U OR plans, transition back to DOAC   Massie Fila, PharmD Clinical Pharmacist  05/03/2024 4:40 PM       [1]  Allergies Allergen Reactions   Ramipril Anaphylaxis   Chlorhexidine     Adhesive [Tape] Rash   Other Diarrhea    Severe intolerance to Chemotherapy in the past.   Sertraline Other (See Comments)    Extreme headaches

## 2024-05-03 NOTE — Progress Notes (Signed)
Heart Failure Navigator Progress Note  Assessed for Heart & Vascular TOC clinic readiness.  Patient does not meet criteria due to PMH of dementia.   Navigator available for reassessment of patient.   Sharen Hones, PharmD, BCPS Heart Failure Stewardship Pharmacist Phone [phone]

## 2024-05-03 NOTE — Progress Notes (Signed)
" °  Progress Note   Date: 05/03/2024  Patient Name: John [name]        MRN#: [id number]  Septic shock      "

## 2024-05-03 NOTE — Plan of Care (Signed)
" °  Problem: Metabolic: Goal: Ability to maintain appropriate glucose levels will improve Outcome: Progressing   Problem: Education: Goal: Knowledge of General Education information will improve Description: Including pain rating scale, medication(s)/side effects and non-pharmacologic comfort measures Outcome: Progressing   Problem: Clinical Measurements: Goal: Ability to maintain clinical measurements within normal limits will improve Outcome: Progressing Goal: Cardiovascular complication will be avoided Outcome: Progressing   Problem: Clinical Measurements: Goal: Cardiovascular complication will be avoided Outcome: Progressing   "

## 2024-05-03 NOTE — Consult Note (Signed)
 "  John [name] [birth year]  [id number].    Requesting MD: Lonni Dalton, MD Chief Complaint/Reason for Consult: acute cholecystitis  HPI:  John [name] is a 79 yo male with a history of dementia, CVA (with left hemiparesis), HTN, a-fib, and complete heart block s/p pacemaker placement, who was admitted from a nursing facility with nausea, vomiting, diarrhea and generalized weakness. He had recently been treated for a UTI and has a chronic indwelling foley, which was noted to be cloudy in color. In the ED he was hypotensive and in a-fib, and started on IV antibiotics and pressors. He was admitted to the ICU. A CT scan showed gallstones with some air in the fundus of the gallbladder. RUQ US  showed similar findings with mild gallbladder wall thickening, and was equivocal for cholecystitis. His urine culture was positive for VRE and he has been on broad spectrum antibiotics. He has clinically improved and is now off pressors. WBC is 23 today from 33 yesterday. A HIDA scan was done today and showed no filling of the gallbladder, consistent with cholecystitis. General surgery was consulted.  The patient is a poor historian and most of the history was obtained from his wife. He does report pain in the RUQ. His appetite has been minimal per his wife.   Of note, he was admitted in April 2025 with symptoms of biliary colic, and had a severely reduced EF on HIDA. He was seen by general surgery with plans to perform a cholecystectomy, however prior to surgery he became severely hypotensive with an elevated troponin. A LHC showed multivessel CAD and he had a DES placed, thus surgery was cancelled. He had an echo yesterday which showed an LVEF of 35-40% (stable from prior).  His wife reports he is unable to complete his ADLs independently and requires a lot of assistance. He also has a history of PE and is on Eliquis  (last dose this morning). He has not had any prior abdominal surgeries.  ROS: Review of  Systems  Constitutional:  Negative for chills and fever.  Gastrointestinal:  Positive for abdominal pain and diarrhea.    Family History  Problem Relation Age of Onset   Lymphoma Mother    Cancer Mother    Diabetes Father    Stroke Father    Ovarian cancer Sister    Cancer Sister    Cancer Paternal Grandmother     Past Medical History:  Diagnosis Date   BPH (benign prostatic hyperplasia)    CTS (carpal tunnel syndrome)    Depression    Diabetes mellitus    Essential hypertension    GERD (gastroesophageal reflux disease)    History of breast cancer    Hypercholesteremia    Neuropathy    Stroke [facility])     Past Surgical History:  Procedure Laterality Date   CATARACT EXTRACTION, BILATERAL  01/18/2017   CORONARY STENT INTERVENTION N/A 08/10/2023   Procedure: CORONARY STENT INTERVENTION;  Surgeon: Darron Deatrice LABOR, MD;  Location: [facility] INVASIVE CV LAB;  Service: Cardiovascular;  Laterality: N/A;   LEFT HEART CATH AND CORONARY ANGIOGRAPHY N/A 08/10/2023   Procedure: LEFT HEART CATH AND CORONARY ANGIOGRAPHY;  Surgeon: Darron Deatrice LABOR, MD;  Location: [facility] INVASIVE CV LAB;  Service: Cardiovascular;  Laterality: N/A;   LITHOTRIPSY     MASTECTOMY Right 05/02/2018   PACEMAKER IMPLANT N/A 05/13/2023   Procedure: PACEMAKER IMPLANT;  Surgeon: Kennyth Chew, MD;  Location: [facility] INVASIVE CV LAB;  Service: Cardiovascular;  Laterality: N/A;   TEMPORARY PACEMAKER N/A  05/11/2023   Procedure: TEMPORARY PACEMAKER;  Surgeon: Jordan, Peter M, MD;  Location: [facility] INVASIVE CV LAB;  Service: Cardiovascular;  Laterality: N/A;   TONSILLECTOMY AND ADENOIDECTOMY     TRANSESOPHAGEAL ECHOCARDIOGRAM (CATH LAB) N/A 12/08/2023   Procedure: TRANSESOPHAGEAL ECHOCARDIOGRAM;  Surgeon: Raford Riggs, MD;  Location: [facility] INVASIVE CV LAB;  Service: Cardiovascular;  Laterality: N/A;    Social History:  reports that he has never smoked. He has never used smokeless tobacco. He reports that he does not drink alcohol and  does not use drugs.  Allergies: Allergies[1]  Medications Prior to Admission  Medication Sig Dispense Refill   amLODipine  (NORVASC ) 5 MG tablet Take 5 mg by mouth daily.     aspirin  EC 81 MG tablet Take 1 tablet (81 mg total) by mouth daily. Swallow whole.     atorvastatin  (LIPITOR ) 80 MG tablet Take 1 tablet (80 mg total) by mouth at bedtime. 90 tablet 1   donepezil  (ARICEPT ) 5 MG tablet Take 1 tablet (5 mg total) by mouth at bedtime. 90 tablet 1   escitalopram  (LEXAPRO ) 10 MG tablet Take 1 tablet (10 mg total) by mouth daily. 90 tablet 1   Insulin  Aspart FlexPen (NOVOLOG ) 100 UNIT/ML Inject 3 Units into the skin 3 (three) times daily before meals. Sliding scale: if BG is 70 - 100= 0 units, 121-150= 1 unit, 151-200= 2 units, 201-250= 3 units, 251-300= 5 units, 301-350= 7 units, 351--400= 9 units, if greater than 400 call NP/PA     LANTUS  SOLOSTAR 100 UNIT/ML Solostar Pen Inject 10 Units into the skin in the morning.     lidocaine  4 % Place 1 patch onto the skin daily.     magnesium  oxide (MAG-OX) 400 (240 Mg) MG tablet Take 1 tablet (400 mg total) by mouth daily. 90 tablet 1   mirtazapine  (REMERON ) 7.5 MG tablet Take 1 tablet (7.5 mg total) by mouth at bedtime. 90 tablet 1   pantoprazole  (PROTONIX ) 40 MG tablet Take 1 tablet (40 mg total) by mouth at bedtime. 30 tablet 0   polyethylene glycol powder (GLYCOLAX /MIRALAX ) 17 GM/SCOOP powder Take 17 g by mouth daily as needed. Dissolve 1 capful (17g) in 4-8 ounces of liquid and take by mouth daily. 238 g 0   promethazine  (PHENERGAN ) 25 MG/ML injection Inject 12.5-25 mg into the muscle every 12 (twelve) hours as needed for nausea or vomiting. May also give 25mg  into the muscle every 6 hours as needed     senna-docusate (SENOKOT-S) 8.6-50 MG tablet Take 1 tablet by mouth at bedtime.     tiZANidine (ZANAFLEX) 2 MG tablet Take 2 mg by mouth 2 (two) times daily as needed for muscle spasms.       Physical Exam: Blood pressure 108/71, pulse 80,  temperature 98.2 F (36.8 C), temperature source Oral, resp. rate 19, height 6' 0.01 (1.829 m), weight 90.2 kg, SpO2 94%. General: resting comfortably, appears stated age, no apparent distress Neurological: alert HEENT: normocephalic, atraumatic CV: regular rate and rhythm Respiratory: normal work of breathing on nasal cannula Abdomen: soft, nondistended, mild focal tenderness to palpation in the RUQ. Extremities: warm and well-perfused    Assessment/Plan 79 yo male with multiple medical comorbidities presenting with sepsis, secondary to a UTI and cholecystitis. I personally reviewed his labs, imaging and notes. He has cholelithiasis with equivocal findings of cholecystitis on CT and US , however a HIDA is consistent with cholecystitis. He is overall clinically improving with antibiotics and supportive care, but does have mild abdominal tenderness on  exam. He is a high risk surgical candidate given his medical comorbidities. Based on the NSQIP surgical risk calculator, he is at a 41% risk of any serious complication, 8.4% risk of cardiac complication, 22% risk of death, and 71% risk of functional decline. It seems his functional status has declined since his most recent stroke. I would recommend a percutaneous cholecystostomy over surgery for this high risk patient. I discussed with his wife that he may dependent on the cholecystostomy long-term, as his surgical risk factors will likely not improve significantly. She is understandably reluctant for him to have another drain, but his risks of complications and functional decline after surgery are high. If he continues to improve on antibiotics alone, we may be able to defer any interventions. Will continue to follow - Please hold Eliquis  for now. Ok for a heparin  gtt if anticoagulation is needed. - Keep NPO after midnight in case of a procedure tomorrow - Continue antibiotics with Gram-negative coverage - Will reassess symptoms and clinical status  tomorrow   Leonor Dawn, MD [facility] Surgery General, Hepatobiliary and Pancreatic Surgery 05/03/24 5:30 PM      [1]  Allergies Allergen Reactions   Ramipril Anaphylaxis   Chlorhexidine     Adhesive [Tape] Rash   Other Diarrhea    Severe intolerance to Chemotherapy in the past.   Sertraline Other (See Comments)    Extreme headaches   "

## 2024-05-04 ENCOUNTER — Inpatient Hospital Stay (HOSPITAL_COMMUNITY)

## 2024-05-04 ENCOUNTER — Encounter (HOSPITAL_COMMUNITY): Payer: Self-pay | Admitting: Pulmonary Disease

## 2024-05-04 DIAGNOSIS — A409 Streptococcal sepsis, unspecified: Secondary | ICD-10-CM | POA: Diagnosis not present

## 2024-05-04 LAB — COMPREHENSIVE METABOLIC PANEL WITH GFR
ALT: 43 U/L (ref 0–44)
AST: 46 U/L — ABNORMAL HIGH (ref 15–41)
Albumin: 2.2 g/dL — ABNORMAL LOW (ref 3.5–5.0)
Alkaline Phosphatase: 323 U/L — ABNORMAL HIGH (ref 38–126)
Anion gap: 10 (ref 5–15)
BUN: 26 mg/dL — ABNORMAL HIGH (ref 8–23)
CO2: 24 mmol/L (ref 22–32)
Calcium: 8.8 mg/dL — ABNORMAL LOW (ref 8.9–10.3)
Chloride: 104 mmol/L (ref 98–111)
Creatinine, Ser: 0.95 mg/dL (ref 0.61–1.24)
GFR, Estimated: >60 mL/min
Glucose, Bld: 164 mg/dL — ABNORMAL HIGH (ref 70–99)
Potassium: 3.9 mmol/L (ref 3.5–5.1)
Sodium: 138 mmol/L (ref 135–145)
Total Bilirubin: 0.9 mg/dL (ref 0.0–1.2)
Total Protein: 5 g/dL — ABNORMAL LOW (ref 6.5–8.1)

## 2024-05-04 LAB — CBC
HCT: 32.5 % — ABNORMAL LOW (ref 39.0–52.0)
Hemoglobin: 11.1 g/dL — ABNORMAL LOW (ref 13.0–17.0)
MCH: 28.2 pg (ref 26.0–34.0)
MCHC: 34.2 g/dL (ref 30.0–36.0)
MCV: 82.7 fL (ref 80.0–100.0)
Platelets: 201 K/uL (ref 150–400)
RBC: 3.93 MIL/uL — ABNORMAL LOW (ref 4.22–5.81)
RDW: 15.3 % (ref 11.5–15.5)
WBC: 16.5 K/uL — ABNORMAL HIGH (ref 4.0–10.5)
nRBC: 0 % (ref 0.0–0.2)

## 2024-05-04 LAB — GLUCOSE, CAPILLARY
Glucose-Capillary: 201 mg/dL — ABNORMAL HIGH (ref 70–99)
Glucose-Capillary: 184 mg/dL — ABNORMAL HIGH (ref 70–99)
Glucose-Capillary: 164 mg/dL — ABNORMAL HIGH (ref 70–99)
Glucose-Capillary: 189 mg/dL — ABNORMAL HIGH (ref 70–99)
Glucose-Capillary: 174 mg/dL — ABNORMAL HIGH (ref 70–99)

## 2024-05-04 LAB — URINE CULTURE: Culture: >=100000 — AB

## 2024-05-04 LAB — APTT: aPTT: 99 s — ABNORMAL HIGH (ref 24–36)

## 2024-05-04 LAB — PROTIME-INR
INR: 1.4 — ABNORMAL HIGH (ref 0.8–1.2)
Prothrombin Time: 18.2 s — ABNORMAL HIGH (ref 11.4–15.2)

## 2024-05-04 MED ORDER — FENTANYL CITRATE (PF) 100 MCG/2ML IJ SOLN
INTRAMUSCULAR | Status: AC
  Filled 2024-05-04: qty 2

## 2024-05-04 MED ORDER — SODIUM CHLORIDE 0.9% FLUSH
5.0000 mL | Freq: Three times a day (TID) | INTRAVENOUS | Status: DC
  Administered 2024-05-04 – 2024-05-10 (×18): 5 mL

## 2024-05-04 MED ORDER — MIDAZOLAM HCL 2 MG/2ML IJ SOLN
INTRAMUSCULAR | Status: AC
  Filled 2024-05-04: qty 2

## 2024-05-04 MED ORDER — MORPHINE SULFATE (PF) 2 MG/ML IV SOLN
2.0000 mg | INTRAVENOUS | Status: DC | PRN
  Administered 2024-05-04 – 2024-05-07 (×6): 2 mg via INTRAVENOUS
  Filled 2024-05-04 (×6): qty 1

## 2024-05-04 MED ORDER — IOHEXOL 300 MG/ML  SOLN
50.0000 mL | Freq: Once | INTRAMUSCULAR | Status: AC | PRN
  Administered 2024-05-04: 10 mL

## 2024-05-04 MED ORDER — MIDAZOLAM HCL (PF) 2 MG/2ML IJ SOLN
INTRAMUSCULAR | Status: AC | PRN
  Administered 2024-05-04: 1 mg via INTRAVENOUS

## 2024-05-04 MED ORDER — LIDOCAINE-EPINEPHRINE 1 %-1:100000 IJ SOLN
INTRAMUSCULAR | Status: AC
  Filled 2024-05-04: qty 1

## 2024-05-04 MED ORDER — MORPHINE SULFATE (PF) 2 MG/ML IV SOLN
2.0000 mg | Freq: Once | INTRAVENOUS | Status: AC
  Administered 2024-05-04: 2 mg via INTRAVENOUS
  Filled 2024-05-04: qty 1

## 2024-05-04 MED ORDER — FENTANYL CITRATE (PF) 100 MCG/2ML IJ SOLN
INTRAMUSCULAR | Status: AC | PRN
  Administered 2024-05-04 (×2): 25 ug via INTRAVENOUS

## 2024-05-04 MED ORDER — LIDOCAINE-EPINEPHRINE 1 %-1:100000 IJ SOLN
20.0000 mL | Freq: Once | INTRAMUSCULAR | Status: AC
  Administered 2024-05-04: 20 mL
  Filled 2024-05-04: qty 20

## 2024-05-04 MED ORDER — HEPARIN (PORCINE) 25000 UT/250ML-% IV SOLN
1250.0000 [IU]/h | INTRAVENOUS | Status: AC
  Administered 2024-05-04 – 2024-05-06 (×3): 1250 [IU]/h via INTRAVENOUS
  Filled 2024-05-04 (×3): qty 250

## 2024-05-04 NOTE — Consult Note (Signed)
 "  Chief Complaint: Acute cholecystitis, septic shock - IR consulted for percutaneous cholecystostomy   Referring Provider(s): Danford, Lonni SQUIBB, MD   Supervising Physician: Vanice Revel  Patient Status: [name] - In-pt  History of Present Illness: John [name] is a 79 y.o. 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 He was admitted 3 days ago with septic shock of unclear source, concern for acute cholecystitis and UTI. Initial CT abd on 1/20 shows distended gallbladder with stones. RUQ US  on 1/21 shows dilation of the gallbladder with sludge and stones, no sonographic murphy's sign. HIDA scan completed yesterday without visualization of the gallbladder, consistent with acute cholecystitis.  Surgery has been following the patient. Pt is poor surgical candidate given current septic shock and many co morbidities. Requesting IR perc chole. Case and imaging reviewed with IR attending Dr. Vanice, with approval to proceed. Percutaneous cholecystostomy discussed with wife at bedside today. She is agreeable. All questions answered.   DNR - Limited: If pulseless and not breathing No CPR or chest compressions.  In Pre-Arrest Conditions (Patient Is Breathing and Has A Pulse) Do not intubate. Provide all appropriate non-invasive medical interventions. Avoid ICU transfer unless indicated or required.    Patient currently has DNR order in place. Discussion with patient's wife regarding wishes.  The original DNR order is maintained during the procedure and prior treatment limitations are upheld during the procedure.   Past Medical History:  Diagnosis Date   BPH (benign prostatic hyperplasia)    CTS (carpal tunnel syndrome)    Depression    Diabetes mellitus    Essential hypertension    GERD (gastroesophageal reflux disease)    History of breast cancer     Hypercholesteremia    Neuropathy    Stroke [facility])     Past Surgical History:  Procedure Laterality Date   CATARACT EXTRACTION, BILATERAL  01/18/2017   CORONARY STENT INTERVENTION N/A 08/10/2023   Procedure: CORONARY STENT INTERVENTION;  Surgeon: Darron Deatrice LABOR, MD;  Location: [facility] INVASIVE CV LAB;  Service: Cardiovascular;  Laterality: N/A;   LEFT HEART CATH AND CORONARY ANGIOGRAPHY N/A 08/10/2023   Procedure: LEFT HEART CATH AND CORONARY ANGIOGRAPHY;  Surgeon: Darron Deatrice LABOR, MD;  Location: [facility] INVASIVE CV LAB;  Service: Cardiovascular;  Laterality: N/A;   LITHOTRIPSY     MASTECTOMY Right 05/02/2018   PACEMAKER IMPLANT N/A 05/13/2023   Procedure: PACEMAKER IMPLANT;  Surgeon: Kennyth Chew, MD;  Location: [name] INVASIVE CV LAB;  Service: Cardiovascular;  Laterality: N/A;   TEMPORARY PACEMAKER N/A 05/11/2023   Procedure: TEMPORARY PACEMAKER;  Surgeon: Jordan, Peter M, MD;  Location: [facility] INVASIVE CV LAB;  Service: Cardiovascular;  Laterality: N/A;   TONSILLECTOMY AND ADENOIDECTOMY     TRANSESOPHAGEAL ECHOCARDIOGRAM (CATH LAB) N/A 12/08/2023   Procedure: TRANSESOPHAGEAL ECHOCARDIOGRAM;  Surgeon: Raford Riggs, MD;  Location: [facility] INVASIVE CV LAB;  Service: Cardiovascular;  Laterality: N/A;    Allergies: Ramipril, Chlorhexidine , Adhesive [tape], Other, and Sertraline  Medications: Prior to Admission medications  Medication Sig Start Date End Date Taking? Authorizing Provider  amLODipine  (NORVASC ) 5 MG tablet Take 5 mg by mouth daily. 04/09/24  Yes [provider]  aspirin  EC 81 MG tablet Take 1 tablet (81 mg total) by mouth daily. Swallow whole. 01/25/24  Yes Drusilla Sabas RAMAN, MD  atorvastatin  (LIPITOR ) 80 MG tablet Take 1 tablet (80 mg total) by mouth at bedtime. 09/14/23  Yes  [name], DO  donepezil  (ARICEPT ) 5 MG tablet Take 1 tablet (5 mg total) by mouth at bedtime. 03/14/24  Yes [name], DO  escitalopram  (LEXAPRO ) 10 MG tablet Take 1 tablet (10 mg  total) by mouth daily. 03/14/24  Yes [name], DO  Insulin  Aspart FlexPen (NOVOLOG ) 100 UNIT/ML Inject 3 Units into the skin 3 (three) times daily before meals. Sliding scale: if BG is 70 - 100= 0 units, 121-150= 1 unit, 151-200= 2 units, 201-250= 3 units, 251-300= 5 units, 301-350= 7 units, 351--400= 9 units, if greater than 400 call NP/PA 04/17/24  Yes [provider]  LANTUS  SOLOSTAR 100 UNIT/ML Solostar Pen Inject 10 Units into the skin in the morning. 03/30/24  Yes [provider]  lidocaine  4 % Place 1 patch onto the skin daily.   Yes [provider]  magnesium  oxide (MAG-OX) 400 (240 Mg) MG tablet Take 1 tablet (400 mg total) by mouth daily. 03/14/24  Yes [name], DO  mirtazapine  (REMERON ) 7.5 MG tablet Take 1 tablet (7.5 mg total) by mouth at bedtime. 03/14/24  Yes [name], DO  pantoprazole  (PROTONIX ) 40 MG tablet Take 1 tablet (40 mg total) by mouth at bedtime. 03/14/24  Yes [name], DO  polyethylene glycol powder (GLYCOLAX /MIRALAX ) 17 GM/SCOOP powder Take 17 g by mouth daily as needed. Dissolve 1 capful (17g) in 4-8 ounces of liquid and take by mouth daily. 02/20/24  Yes Love, Sharlet RAMAN, PA-C  promethazine  (PHENERGAN ) 25 MG/ML injection Inject 12.5-25 mg into the muscle every 12 (twelve) hours as needed for nausea or vomiting. May also give 25mg  into the muscle every 6 hours as needed 04/14/24  Yes [provider]  senna-docusate (SENOKOT-S) 8.6-50 MG tablet Take 1 tablet by mouth at bedtime.   Yes [provider]  tiZANidine (ZANAFLEX) 2 MG tablet Take 2 mg by mouth 2 (two) times daily as needed for muscle spasms.   Yes [provider]  apixaban  (ELIQUIS ) 5 MG TABS tablet Take 1 tablet (5 mg total) by mouth 2 (two) times daily. 10/11/23 10/11/23  [name], DO     Family History  Problem Relation Age of Onset   Lymphoma Mother    Cancer Mother    Diabetes Father     Stroke Father    Ovarian cancer Sister    Cancer Sister    Cancer Paternal Grandmother     Social History   Socioeconomic History   Marital status: Married    Spouse name: Not on file   Number of children: Not on file   Years of education: Not on file   Highest education level: Bachelor's degree (e.g., BA, AB, BS)  Occupational History   Not on file  Tobacco Use   Smoking status: Never   Smokeless tobacco: Never  Vaping Use   Vaping status: Never Used  Substance and Sexual Activity   Alcohol use: No   Drug use: No   Sexual activity: Not Currently    Partners: Female  Other Topics Concern   Not on file  Social History Narrative   Not on file   Social Drivers of Health   Tobacco Use: Low Risk (05/01/2024)   Patient History    Smoking Tobacco Use: Never    Smokeless Tobacco Use: Never    Passive Exposure: Not on file  Financial Resource Strain: Low Risk (10/03/2023)   Overall Financial Resource Strain (CARDIA)    Difficulty of Paying Living  Expenses: Not hard at all  Food Insecurity: Patient Unable To Answer (05/01/2024)   Epic    Worried About Programme Researcher, Broadcasting/film/video in the Last Year: Patient unable to answer    Ran Out of Food in the Last Year: Patient unable to answer  Transportation Needs: Patient Unable To Answer (05/01/2024)   Epic    Lack of Transportation (Medical): Patient unable to answer    Lack of Transportation (Non-Medical): Patient unable to answer  Physical Activity: Inactive (10/03/2023)   Exercise Vital Sign    Days of Exercise per Week: 0 days    Minutes of Exercise per Session: Not on file  Stress: No Stress Concern Present (10/03/2023)   [employer] of Occupational Health - Occupational Stress Questionnaire    Feeling of Stress: Not at all  Social Connections: Patient Unable To Answer (05/01/2024)   Social Connection and Isolation Panel    Frequency of Communication with Friends and Family: Patient unable to answer    Frequency of Social  Gatherings with Friends and Family: Patient unable to answer    Attends Religious Services: Patient unable to answer    Active Member of Clubs or Organizations: Patient unable to answer    Attends Banker Meetings: Patient unable to answer    Marital Status: Patient unable to answer  Depression (PHQ2-9): Low Risk (01/28/2023)   Depression (PHQ2-9)    PHQ-2 Score: 0  Alcohol Screen: Low Risk (01/27/2023)   Alcohol Screen    Last Alcohol Screening Score (AUDIT): 0  Housing: Unknown (05/01/2024)   Epic    Unable to Pay for Housing in the Last Year: Patient unable to answer    Number of Times Moved in the Last Year: 0    Homeless in the Last Year: Patient unable to answer  Utilities: Patient Unable To Answer (05/01/2024)   Epic    Threatened with loss of utilities: Patient unable to answer  Health Literacy: Not on file    Review of Systems: unable to obtain due to altered mental status  Vital Signs: BP 126/66   Pulse 98   Temp 100.1 F (37.8 C) (Oral)   Resp (!) 23   Ht 6' 0.01 (1.829 m)   Wt 207 lb 7.3 oz (94.1 kg)   SpO2 94%   BMI 28.13 kg/m   Advance Care Plan: No documents on file   Physical Exam Cardiovascular:     Rate and Rhythm: Normal rate.  Pulmonary:     Effort: Pulmonary effort is normal. No respiratory distress.     Comments: On oxygen via nasal cannula Abdominal:     Tenderness: There is abdominal tenderness.     Comments: Tenderness to RUQ on palpation  Musculoskeletal:     Right lower leg: No edema.     Left lower leg: No edema.  Skin:    General: Skin is warm and dry.  Neurological:     Mental Status: He is disoriented.     Imaging: NM Hepatobiliary Liver Func Result Date: 05/03/2024 EXAM: NM HEPATOBILLARY SCAN 05/03/2024 03:52:56 PM TECHNIQUE: RADIOPHARMACEUTICAL: 5.4 mCi Tc-102m mebrofenin  Dynamic images of the abdomen and pelvis were obtained in the anterior projection for 1 hour after intravenous administration of  radiopharmaceutical. The gallbladder failed to fill at 45 minutes of imaging. Intravenous morphine  was administered to augment filling of the gallbladder. The gallbladder failed to fill after morphine  augmentation. Imaging continued for at least 30 minutes after morphine  administration. COMPARISON: CT 03/13/2025 and ultrasound 03/12/2025. CLINICAL  HISTORY: Abdominal pain, acute, nonlocalized. Acute, nonlocalized abdominal pain. FINDINGS: Homogenous uptake within the liver. Normal clearance of the blood pool. The gallbladder failed to fill at 45 minutes of imaging. Intravenous morphine  was administered to augment filling of the gallbladder. The gallbladder failed to fill after morphine  augmentation. This is suggestive of acute cholecystitis. Appropriate excretion into the biliary system. Patent common bile duct. Activity is visualized in the small bowel. IMPRESSION: 1. Nonvisualization of the gallbladder despite morphine  augmentation, suggestive of acute cholecystitis. 2. Patent common bile duct. 3. These results will be called to the ordering clinician or representative by the radiologist assistant, and communication documented in the pacs or clario dashboard. Electronically signed by: Norleen Boxer MD 05/03/2024 04:00 PM EST RP Workstation: [id number]   US  Abdomen Limited RUQ (LIVER/GB) Result Date: 05/02/2024 EXAM: Right Upper Quadrant Abdominal Ultrasound 05/02/2024 08:10:00 PM TECHNIQUE: Real-time ultrasonography of the right upper quadrant of the abdomen was performed. COMPARISON: US  Abdomen Limited 08/07/2023, and CT abdomen and pelvis with IV contrast 05/01/2024 07:09 PM. CLINICAL HISTORY: RUQ pain. FINDINGS: LIVER: Normal echogenicity. No intrahepatic biliary ductal dilatation. No evidence of mass. Hepatopetal flow in the portal vein. Minimal perihepatic ascites is again noted. BILIARY SYSTEM: Dilatation of the gallbladder up to 11 cm in length is again noted, with sludge and stones in the proximal lumen,  largest gallstone measuring 1.4 cm. Gallbladder wall thickening is seen along the gallbladder fossa up to 5 to 6 mm, but there was no positive sonographic Murphy sign, although technologist noted altered mental status and acute cholecystitis is not excluded. No pericholecystic fluid. The common bile duct measures 4.2 mm. IMPRESSION: 1. Dilatation of the gallbladder with sludge and stones, largest gallstone measuring 1.4 cm, and gallbladder wall thickening up to 5 to 6 mm without a positive sonographic Murphy sign in a patient with altered mental status according to the technologist ; acute cholecystitis is not excluded. 2. Minimal perihepatic ascites, without pericholecystic fluid. 3. No biliary dilatation . Electronically signed by: Francis Quam MD 05/02/2024 08:46 PM EST RP Workstation: [id number]   ECHOCARDIOGRAM COMPLETE Result Date: 05/02/2024    ECHOCARDIOGRAM REPORT   Patient Name:   John [name] Date of Exam: 05/02/2024 Medical Rec #:  [id number]        Height:       72.0 in Accession #:    [id number]       Weight:       198.9 lb Date of Birth:  04-19-1945        BSA:          2.125 m Patient Age:    78 years         BP:           108/63 mmHg Patient Gender: M                HR:           72 bpm. Exam Location:  Inpatient Procedure: 2D Echo, Cardiac Doppler and Color Doppler (Both Spectral and Color            Flow Doppler were utilized during procedure). Indications:    Atrila Fibrillation  History:        Patient has prior history of Echocardiogram examinations, most                 recent 08/11/2023. CHF, Acute MI; Risk Factors:Dyslipidemia and                 Diabetes.  Sonographer:    Sherlean Dubin Referring Phys: [id number] OMAR M ALBUSTAMI IMPRESSIONS  1. Left ventricular ejection fraction, by estimation, is 35 to 40%. The left ventricle has moderately decreased function. The left ventricle demonstrates global hypokinesis. There is mild concentric left ventricular hypertrophy. Left ventricular  diastolic parameters are consistent with Grade I diastolic dysfunction (impaired relaxation).  2. Right ventricular systolic function is normal. The right ventricular size is mildly enlarged.  3. The mitral valve is normal in structure. No evidence of mitral valve regurgitation. No evidence of mitral stenosis.  4. The aortic valve is normal in structure. There is mild thickening of the aortic valve. Aortic valve regurgitation is trivial. Aortic valve sclerosis/calcification is present, without any evidence of aortic stenosis.  5. The inferior vena cava is normal in size with greater than 50% respiratory variability, suggesting right atrial pressure of 3 mmHg. FINDINGS  Left Ventricle: Left ventricular ejection fraction, by estimation, is 35 to 40%. The left ventricle has moderately decreased function. The left ventricle demonstrates global hypokinesis. The left ventricular internal cavity size was normal in size. There is mild concentric left ventricular hypertrophy. Left ventricular diastolic parameters are consistent with Grade I diastolic dysfunction (impaired relaxation). Right Ventricle: The right ventricular size is mildly enlarged. No increase in right ventricular wall thickness. Right ventricular systolic function is normal. Left Atrium: Left atrial size was normal in size. Right Atrium: Right atrial size was normal in size. Pericardium: There is no evidence of pericardial effusion. Presence of epicardial fat layer. Mitral Valve: The mitral valve is normal in structure. No evidence of mitral valve regurgitation. No evidence of mitral valve stenosis. MV peak gradient, 6.7 mmHg. The mean mitral valve gradient is 3.0 mmHg. Tricuspid Valve: The tricuspid valve is normal in structure. Tricuspid valve regurgitation is not demonstrated. No evidence of tricuspid stenosis. Aortic Valve: The aortic valve is normal in structure. There is mild thickening of the aortic valve. There is mild aortic valve annular  calcification. Aortic valve regurgitation is trivial. Aortic valve sclerosis/calcification is present, without any evidence of aortic stenosis. Aortic valve mean gradient measures 3.0 mmHg. Aortic valve peak gradient measures 5.2 mmHg. Aortic valve area, by VTI measures 2.19 cm. Pulmonic Valve: The pulmonic valve was normal in structure. Pulmonic valve regurgitation is not visualized. No evidence of pulmonic stenosis. Aorta: The aortic root is normal in size and structure. Venous: The inferior vena cava is normal in size with greater than 50% respiratory variability, suggesting right atrial pressure of 3 mmHg. IAS/Shunts: No atrial level shunt detected by color flow Doppler.  LEFT VENTRICLE PLAX 2D LVIDd:         4.40 cm   Diastology LVIDs:         3.60 cm   LV e' medial:    6.66 cm/s LV PW:         1.20 cm   LV E/e' medial:  11.8 LV IVS:        1.10 cm   LV e' lateral:   12.80 cm/s LVOT diam:     2.00 cm   LV E/e' lateral: 6.1 LV SV:         53 LV SV Index:   25 LVOT Area:     3.14 cm  RIGHT VENTRICLE RV Basal diam:  3.60 cm RV Mid diam:    3.30 cm RV S prime:     9.99 cm/s TAPSE (M-mode): 2.0 cm LEFT ATRIUM           Index  RIGHT ATRIUM           Index LA diam:      4.00 cm 1.88 cm/m   RA Area:     19.20 cm LA Vol (A2C): 60.0 ml 28.23 ml/m  RA Volume:   51.70 ml  24.33 ml/m LA Vol (A4C): 52.4 ml 24.65 ml/m  AORTIC VALVE AV Area (Vmax):    2.38 cm AV Area (Vmean):   2.19 cm AV Area (VTI):     2.19 cm AV Vmax:           114.50 cm/s AV Vmean:          80.850 cm/s AV VTI:            0.240 m AV Peak Grad:      5.2 mmHg AV Mean Grad:      3.0 mmHg LVOT Vmax:         86.90 cm/s LVOT Vmean:        56.350 cm/s LVOT VTI:          0.167 m LVOT/AV VTI ratio: 0.70  AORTA Ao Root diam: 2.90 cm Ao Asc diam:  3.20 cm MITRAL VALVE                TRICUSPID VALVE MV Area (PHT): 3.85 cm     TR Peak grad:   4.5 mmHg MV Area VTI:   1.56 cm     TR Vmax:        106.00 cm/s MV Peak grad:  6.7 mmHg MV Mean grad:  3.0 mmHg      SHUNTS MV Vmax:       1.29 m/s     Systemic VTI:  0.17 m MV Vmean:      81.6 cm/s    Systemic Diam: 2.00 cm MV Decel Time: 197 msec MV E velocity: 78.40 cm/s MV A velocity: 110.00 cm/s MV E/A ratio:  0.71 Kardie Tobb DO Electronically signed by Dub Huntsman DO Signature Date/Time: 05/02/2024/4:38:54 PM    Final    CT ABDOMEN PELVIS W CONTRAST Result Date: 05/01/2024 EXAM: CT ABDOMEN AND PELVIS WITH CONTRAST 05/01/2024 07:13:00 PM TECHNIQUE: CT of the abdomen and pelvis was performed with the administration of intravenous contrast. Multiplanar reformatted images are provided for review. Automated exposure control, iterative reconstruction, and/or weight-based adjustment of the mA/kV was utilized to reduce the radiation dose to as low as reasonably achievable. COMPARISON: 01/29/2024 CLINICAL HISTORY: Abd pain, n/v/d FINDINGS: LOWER CHEST: Airspace opacity in the left lower lobe could reflect atelectasis or pneumonia. LIVER: The liver is unremarkable. GALLBLADDER AND BILE DUCTS: The gallbladder is distended, similar to prior study. Layering gallstones within the gallbladder. Small amount of air noted in the fundus of the gallbladder, question recent instrumentation or prior sphincterotomy. SPLEEN: Calcifications throughout the spleen compatible with old granulomatous disease. PANCREAS: No acute abnormality. ADRENAL GLANDS: No acute abnormality. KIDNEYS, URETERS AND BLADDER: No stones in the kidneys or ureters. No hydronephrosis. No perinephric or periureteral stranding. The urinary bladder is thick-walled and could be related to cystitis or chronic bladder outlet obstruction. GI AND BOWEL: Stomach demonstrates no acute abnormality. There is no bowel obstruction. PERITONEUM AND RETROPERITONEUM: No ascites. No free air. VASCULATURE: Aortic atherosclerosis. LYMPH NODES: No lymphadenopathy. REPRODUCTIVE ORGANS: Mild enlargement of the prostate. BONES AND SOFT TISSUES: No acute osseous abnormality. No focal soft tissue  abnormality. IMPRESSION: 1. Distended gallbladder with layering gallstones and small amount of air in the fundus, possibly related to recent instrumentation or prior sphincterotomy.  Recommend clinical correlation . 2. Thick-walled urinary bladder, possibly related to cystitis or chronic bladder outlet obstruction. 3. Airspace opacity in the left lower lobe, atelectasis or pneumonia. Electronically signed by: Franky Crease MD 05/01/2024 07:21 PM EST RP Workstation: [id number]   DG Chest 1 View Result Date: 05/01/2024 CLINICAL DATA:  Shortness of breath and cough. EXAM: CHEST  1 VIEW COMPARISON:  Chest radiograph dated 01/28/2024. FINDINGS: No focal consolidation, pleural effusion, pneumothorax. Stable cardiac silhouette. Left pectoral pacemaker device. No acute osseous pathology. IMPRESSION: No active disease. Electronically Signed   By: Vanetta Chou M.D.   On: 05/01/2024 17:00    Labs:  CBC: Recent Labs    05/01/24 1540 05/01/24 1620 05/01/24 2049 05/02/24 0401 05/03/24 0650 05/04/24 0618  WBC 17.6*  --   --  32.8* 22.8* 16.5*  HGB 11.5*   < > 9.9* 11.3* 11.6* 11.1*  HCT 33.6*   < > 29.0* 33.8* 34.5* 32.5*  PLT 327  --   --  289 209 201   < > = values in this interval not displayed.    COAGS: Recent Labs    08/06/23 2344 08/09/23 0845 08/10/23 0502 08/11/23 0840 01/28/24 2329 05/04/24 0618  INR 1.4*  --   --   --  1.0  --   APTT  --    < > 125* 79* 25 99*   < > = values in this interval not displayed.    BMP: Recent Labs    05/01/24 1540 05/01/24 1620 05/01/24 2049 05/02/24 0401 05/03/24 0650 05/04/24 0958  NA 130* 131* 130* 130* 135 138  K 2.9* 2.9* 2.8* 3.3* 3.4* 3.9  CL 92* 93*  --  93* 101 104  CO2 24  --   --  19* 24 24  GLUCOSE 198* 200*  --  145* 140* 164*  BUN 27* 28*  --  27* 23 26*  CALCIUM  8.5*  --   --  8.5* 8.9 8.8*  CREATININE 1.20 1.20  --  1.41* 0.90 0.95  GFRNONAA >60  --   --  51* >60 >60    LIVER FUNCTION TESTS: Recent Labs     01/30/24 0250 01/31/24 0759 02/01/24 0232 02/04/24 0551 05/01/24 1540 05/04/24 0958  BILITOT 1.3*  --   --  1.2 1.4* 0.9  AST 26  --   --  14* 28 46*  ALT 28  --   --  19 13 43  ALKPHOS 75  --   --  71 159* 323*  PROT 4.7*  --   --  4.7* 5.9* 5.0*  ALBUMIN 2.4*   < > 2.4* 2.4* 3.0* 2.2*   < > = values in this interval not displayed.    TUMOR MARKERS: No results for input(s): AFPTM, CEA, CA199, CHROMGRNA in the last 8760 hours.  Assessment and Plan: Cholecystitis-  IR consulted for percutaneous cholecystostomy tube placement. Tentative plan to perform today, 05/04/24 - Pt has been NPO since MN - INR pending - pt on continuous heparin  drip, this was stopped at 1500  Risks and benefits discussed with the patient including, but not limited to bleeding, infection, gallbladder perforation, bile leak, sepsis or even death.  All of the patient's wife's questions were answered, wife is agreeable to proceed. Consent signed and in chart.    Thank you for allowing our service to participate in John [name] 's care.   Zyia Kaneko B Alandria Butkiewicz NP 05/04/2024 3:48 PM     I spent a total of  40 Minutes in face to face in clinical consultation, greater than 50% of which was counseling/coordinating care for image guided percutaneous cholecystostomy tube placement.   "

## 2024-05-04 NOTE — Progress Notes (Signed)
 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 He presented to the ED from home with nausea, vomiting, weakness, and decreased oral intake. In the ED he was found to be hypotensive and in afib and had gross hematuria. He was admitted to the ICU on abx for likely sepsis. HIDA scan in the ICU revealed cholecystitis. Urine culture was positive for Enterococcus gallinarum and Proteus mirabilis.    Assessment & Plan:   Principal Problem:   Sepsis (HCC)   Sepsis of unknown origin Suspected source cholecystitis and UTI. Enterococcus gallinarum and Proteus mirabilis.   -leukocytosis 16.5K/uL   -Lactate 3.3 mmol/L    -Blood and urine cultures drawn -Antibiotics: zosyn  3.375 g IV q6hrs - BUN and Cr within normal limits  Acute metabolic encephalopathy Fluctuating mental status  Delirium -pt has dementia at baseline -oriented only to person -delirium precautions -continue donepezil  -continue escitalopram  -continue mirtazapine   CAD Cerebrovascular disease HTN CHF -continue atorvastatin , eliquis , and aspirin   Diabetes -blood glucose is stable -continue sliding scale insulin   BPH with chronic indwelling catheter -potential source of UTI  -monitor urine output  Atrial fibrillation  Complete heart block Pacemaker placed -continue eliquis   AKI Hypokalemia and hyponatremia -continue IV fluids -trend BUN and Cr -electrolytes repleted  Depression -continue donepezil  -continue escitalopram  -continue mirtazapine   DVT prophylaxis: Anticoagulated on eliquis , hold for  procedure Code Status: DNR- limited Family Communication: No family at bedside this am Disposition:   Status is: Inpatient Remains inpatient appropriate because: IV abx   Consultants:  Surgery: not a good surgical candidate for cholecystectomy,    Antimicrobials:  Zosyn  3.375g IV q 8hrs    Subjective: Pt only mumbles in response to questions. Able to state his name.   Objective: Vitals:   05/04/24 0600 05/04/24 0615 05/04/24 0700 05/04/24 0733  BP:      Pulse: 97 100 95   Resp: (!) 23 (!) 26 (!) 24   Temp:    99.1 F (37.3 C)  TempSrc:    Oral  SpO2: 97% 97% 97%   Weight:      Height:        Intake/Output Summary (Last 24 hours) at 05/04/2024 0743 Last data filed at 05/04/2024 0700 Gross per 24 hour  Intake 920.87 ml  Output 250 ml  Net 670.87 ml   Filed Weights   05/02/24 0418 05/03/24 0500 05/04/24 0500  Weight: 90.2 kg 90.2 kg 94.1 kg    Examination:  General exam: Appears calm and comfortable  Respiratory system: Clear to auscultation. Respiratory effort normal. Cardiovascular system: S1 & S2 heard, RRR. No JVD, murmurs, rubs, gallops or clicks. No pedal edema. Gastrointestinal system: Abdomen is nondistended, soft and nontender. No organomegaly or masses felt. Normal bowel sounds heard. Central nervous system: Alert and oriented. No focal neurological deficits. Extremities: Symmetric 5 x 5 power. Skin: No rashes, lesions or ulcers Psychiatry: Judgement and insight appear normal. Mood & affect appropriate.     Data Reviewed: I have personally reviewed following labs and imaging studies  CBC: Recent Labs  Lab 05/01/24 1540 05/01/24 1620 05/01/24  2049 05/02/24 0401 05/03/24 0650 05/04/24 0618  WBC 17.6*  --   --  32.8* 22.8* 16.5*  NEUTROABS 15.4*  --   --   --   --   --   HGB 11.5* 11.6* 9.9* 11.3* 11.6* 11.1*  HCT 33.6* 34.0* 29.0* 33.8* 34.5* 32.5*  MCV 83.0  --   --  85.6 83.9 82.7  PLT 327  --   --  289 209 201    Basic Metabolic  Panel: Recent Labs  Lab 05/01/24 1540 05/01/24 1620 05/01/24 2049 05/01/24 2255 05/02/24 0401 05/03/24 0650  NA 130* 131* 130*  --  130* 135  K 2.9* 2.9* 2.8*  --  3.3* 3.4*  CL 92* 93*  --   --  93* 101  CO2 24  --   --   --  19* 24  GLUCOSE 198* 200*  --   --  145* 140*  BUN 27* 28*  --   --  27* 23  CREATININE 1.20 1.20  --   --  1.41* 0.90  CALCIUM  8.5*  --   --   --  8.5* 8.9  MG  --   --   --  1.6* 1.7 2.3  PHOS  --   --   --   --  2.9  --     GFR: Estimated Creatinine Clearance: 81.9 mL/min (by C-G formula based on SCr of 0.9 mg/dL).  Liver Function Tests: Recent Labs  Lab 05/01/24 1540  AST 28  ALT 13  ALKPHOS 159*  BILITOT 1.4*  PROT 5.9*  ALBUMIN 3.0*    CBG: Recent Labs  Lab 05/03/24 1531 05/03/24 1923 05/03/24 2305 05/04/24 0343 05/04/24 0736  GLUCAP 178* 212* 202* 201* 184*     Recent Results (from the past 240 hours)  C Difficile Quick Screen w PCR reflex     Status: None   Collection Time: 05/01/24  4:16 PM   Specimen: STOOL  Result Value Ref Range Status   C Diff antigen NEGATIVE NEGATIVE Final   C Diff toxin NEGATIVE NEGATIVE Final   C Diff interpretation No C. difficile detected.  Final    Comment: Performed at [facility], [address] [place], [place] [address]  Culture, blood (single)     Status: None (Preliminary result)   Collection Time: 05/01/24  4:41 PM   Specimen: BLOOD RIGHT WRIST  Result Value Ref Range Status   Specimen Description BLOOD RIGHT WRIST  Final   Special Requests   Final    BOTTLES DRAWN AEROBIC AND ANAEROBIC Blood Culture results may not be optimal due to an inadequate volume of blood received in culture bottles   Culture   Final    NO GROWTH 2 DAYS Performed at [facility], [address] [place], [place] [address]    Report Status PENDING  Incomplete  Urine Culture     Status: Abnormal   Collection Time: 05/01/24  7:47 PM   Specimen: Urine, Random  Result Value Ref Range Status    Specimen Description URINE, RANDOM  Final   Special Requests   Final    NONE Reflexed from [id number] Performed at [facility], [address] [place], [place] [address]    Culture (A)  Final    >=100,000 COLONIES/mL ENTEROCOCCUS GALLINARUM VANCOMYCIN  RESISTANT ENTEROCOCCUS ISOLATED 40,000 COLONIES/mL PROTEUS MIRABILIS    Report Status 05/04/2024 FINAL  Final   Organism ID, Bacteria ENTEROCOCCUS GALLINARUM (A)  Final  Organism ID, Bacteria PROTEUS MIRABILIS (A)  Final      Susceptibility   Enterococcus gallinarum - MIC*    AMPICILLIN  <=2 SENSITIVE Sensitive     NITROFURANTOIN <=16 SENSITIVE Sensitive     VANCOMYCIN  RESISTANT Resistant     * >=100,000 COLONIES/mL ENTEROCOCCUS GALLINARUM   Proteus mirabilis - MIC*    AMPICILLIN  <=2 SENSITIVE Sensitive     CEFAZOLIN  (URINE) Value in next row Sensitive      16 SENSITIVEThis is a modified FDA-approved test that has been validated and its performance characteristics determined by the reporting laboratory.  This laboratory is certified under the Clinical Laboratory Improvement Amendments CLIA as qualified to perform high complexity clinical laboratory testing.    CEFEPIME  Value in next row Sensitive      16 SENSITIVEThis is a modified FDA-approved test that has been validated and its performance characteristics determined by the reporting laboratory.  This laboratory is certified under the Clinical Laboratory Improvement Amendments CLIA as qualified to perform high complexity clinical laboratory testing.    ERTAPENEM Value in next row Sensitive      16 SENSITIVEThis is a modified FDA-approved test that has been validated and its performance characteristics determined by the reporting laboratory.  This laboratory is certified under the Clinical Laboratory Improvement Amendments CLIA as qualified to perform high complexity clinical laboratory testing.    CEFTRIAXONE  Value in next row Sensitive      16 SENSITIVEThis is a modified FDA-approved  test that has been validated and its performance characteristics determined by the reporting laboratory.  This laboratory is certified under the Clinical Laboratory Improvement Amendments CLIA as qualified to perform high complexity clinical laboratory testing.    CIPROFLOXACIN  Value in next row Sensitive      16 SENSITIVEThis is a modified FDA-approved test that has been validated and its performance characteristics determined by the reporting laboratory.  This laboratory is certified under the Clinical Laboratory Improvement Amendments CLIA as qualified to perform high complexity clinical laboratory testing.    GENTAMICIN  Value in next row Sensitive      16 SENSITIVEThis is a modified FDA-approved test that has been validated and its performance characteristics determined by the reporting laboratory.  This laboratory is certified under the Clinical Laboratory Improvement Amendments CLIA as qualified to perform high complexity clinical laboratory testing.    NITROFURANTOIN Value in next row Resistant      16 SENSITIVEThis is a modified FDA-approved test that has been validated and its performance characteristics determined by the reporting laboratory.  This laboratory is certified under the Clinical Laboratory Improvement Amendments CLIA as qualified to perform high complexity clinical laboratory testing.    TRIMETH/SULFA Value in next row Sensitive      16 SENSITIVEThis is a modified FDA-approved test that has been validated and its performance characteristics determined by the reporting laboratory.  This laboratory is certified under the Clinical Laboratory Improvement Amendments CLIA as qualified to perform high complexity clinical laboratory testing.    AMPICILLIN /SULBACTAM Value in next row Sensitive      16 SENSITIVEThis is a modified FDA-approved test that has been validated and its performance characteristics determined by the reporting laboratory.  This laboratory is certified under the Clinical  Laboratory Improvement Amendments CLIA as qualified to perform high complexity clinical laboratory testing.    PIP/TAZO Value in next row Sensitive      <=4 SENSITIVEThis is a modified FDA-approved test that has been validated and its performance characteristics determined by the reporting laboratory.  This laboratory  is certified under the Clinical Laboratory Improvement Amendments CLIA as qualified to perform high complexity clinical laboratory testing.    MEROPENEM  Value in next row Sensitive      <=4 SENSITIVEThis is a modified FDA-approved test that has been validated and its performance characteristics determined by the reporting laboratory.  This laboratory is certified under the Clinical Laboratory Improvement Amendments CLIA as qualified to perform high complexity clinical laboratory testing.    * 40,000 COLONIES/mL PROTEUS MIRABILIS  MRSA Next Gen by PCR, Nasal     Status: None   Collection Time: 05/01/24  9:53 PM   Specimen: Nasal Mucosa; Nasal Swab  Result Value Ref Range Status   MRSA by PCR Next Gen NOT DETECTED NOT DETECTED Final    Comment: (NOTE) The GeneXpert MRSA Assay (FDA approved for NASAL specimens only), is one component of a comprehensive MRSA colonization surveillance program. It is not intended to diagnose MRSA infection nor to guide or monitor treatment for MRSA infections. Test performance is not FDA approved in patients less than 37 years old. Performed at [facility], [address] [place], [place] [address]   Culture, blood (Routine X 2) w Reflex to ID Panel     Status: None (Preliminary result)   Collection Time: 05/01/24 10:55 PM   Specimen: BLOOD LEFT HAND  Result Value Ref Range Status   Specimen Description BLOOD LEFT HAND  Final   Special Requests   Final    BOTTLES DRAWN AEROBIC AND ANAEROBIC Blood Culture adequate volume   Culture   Final    NO GROWTH 2 DAYS Performed at [facility], [address], [place], [place] [address]     Report Status PENDING  Incomplete         Radiology Studies: NM Hepatobiliary Liver Func Result Date: 05/03/2024 EXAM: NM HEPATOBILLARY SCAN 05/03/2024 03:52:56 PM TECHNIQUE: RADIOPHARMACEUTICAL: 5.4 mCi Tc-36m mebrofenin  Dynamic images of the abdomen and pelvis were obtained in the anterior projection for 1 hour after intravenous administration of radiopharmaceutical. The gallbladder failed to fill at 45 minutes of imaging. Intravenous morphine  was administered to augment filling of the gallbladder. The gallbladder failed to fill after morphine  augmentation. Imaging continued for at least 30 minutes after morphine  administration. COMPARISON: CT 03/13/2025 and ultrasound 03/12/2025. CLINICAL HISTORY: Abdominal pain, acute, nonlocalized. Acute, nonlocalized abdominal pain. FINDINGS: Homogenous uptake within the liver. Normal clearance of the blood pool. The gallbladder failed to fill at 45 minutes of imaging. Intravenous morphine  was administered to augment filling of the gallbladder. The gallbladder failed to fill after morphine  augmentation. This is suggestive of acute cholecystitis. Appropriate excretion into the biliary system. Patent common bile duct. Activity is visualized in the small bowel. IMPRESSION: 1. Nonvisualization of the gallbladder despite morphine  augmentation, suggestive of acute cholecystitis. 2. Patent common bile duct. 3. These results will be called to the ordering clinician or representative by the radiologist assistant, and communication documented in the pacs or clario dashboard. Electronically signed by: Norleen Boxer MD 05/03/2024 04:00 PM EST RP Workstation: [id number]   US  Abdomen Limited RUQ (LIVER/GB) Result Date: 05/02/2024 EXAM: Right Upper Quadrant Abdominal Ultrasound 05/02/2024 08:10:00 PM TECHNIQUE: Real-time ultrasonography of the right upper quadrant of the abdomen was performed. COMPARISON: US  Abdomen Limited 08/07/2023, and CT abdomen and pelvis with IV contrast  05/01/2024 07:09 PM. CLINICAL HISTORY: RUQ pain. FINDINGS: LIVER: Normal echogenicity. No intrahepatic biliary ductal dilatation. No evidence of mass. Hepatopetal flow in the portal vein. Minimal perihepatic ascites is again noted. BILIARY  SYSTEM: Dilatation of the gallbladder up to 11 cm in length is again noted, with sludge and stones in the proximal lumen, largest gallstone measuring 1.4 cm. Gallbladder wall thickening is seen along the gallbladder fossa up to 5 to 6 mm, but there was no positive sonographic Murphy sign, although technologist noted altered mental status and acute cholecystitis is not excluded. No pericholecystic fluid. The common bile duct measures 4.2 mm. IMPRESSION: 1. Dilatation of the gallbladder with sludge and stones, largest gallstone measuring 1.4 cm, and gallbladder wall thickening up to 5 to 6 mm without a positive sonographic Murphy sign in a patient with altered mental status according to the technologist ; acute cholecystitis is not excluded. 2. Minimal perihepatic ascites, without pericholecystic fluid. 3. No biliary dilatation . Electronically signed by: Francis Quam MD 05/02/2024 08:46 PM EST RP Workstation: [id number]   ECHOCARDIOGRAM COMPLETE Result Date: 05/02/2024    ECHOCARDIOGRAM REPORT   Patient Name:   John [name] Date of Exam: 05/02/2024 Medical Rec #:  [id number]        Height:       72.0 in Accession #:    [id number]       Weight:       198.9 lb Date of Birth:  06-14-1945        BSA:          2.125 m Patient Age:    78 years         BP:           108/63 mmHg Patient Gender: M                HR:           72 bpm. Exam Location:  Inpatient Procedure: 2D Echo, Cardiac Doppler and Color Doppler (Both Spectral and Color            Flow Doppler were utilized during procedure). Indications:    Atrila Fibrillation  History:        Patient has prior history of Echocardiogram examinations, most                 recent 08/11/2023. CHF, Acute MI; Risk Factors:Dyslipidemia and                  Diabetes.  Sonographer:    Sherlean Dubin Referring Phys: [id number] OMAR M ALBUSTAMI IMPRESSIONS  1. Left ventricular ejection fraction, by estimation, is 35 to 40%. The left ventricle has moderately decreased function. The left ventricle demonstrates global hypokinesis. There is mild concentric left ventricular hypertrophy. Left ventricular diastolic parameters are consistent with Grade I diastolic dysfunction (impaired relaxation).  2. Right ventricular systolic function is normal. The right ventricular size is mildly enlarged.  3. The mitral valve is normal in structure. No evidence of mitral valve regurgitation. No evidence of mitral stenosis.  4. The aortic valve is normal in structure. There is mild thickening of the aortic valve. Aortic valve regurgitation is trivial. Aortic valve sclerosis/calcification is present, without any evidence of aortic stenosis.  5. The inferior vena cava is normal in size with greater than 50% respiratory variability, suggesting right atrial pressure of 3 mmHg. FINDINGS  Left Ventricle: Left ventricular ejection fraction, by estimation, is 35 to 40%. The left ventricle has moderately decreased function. The left ventricle demonstrates global hypokinesis. The left ventricular internal cavity size was normal in size. There is mild concentric left ventricular hypertrophy. Left ventricular diastolic parameters are consistent with Grade I diastolic dysfunction (impaired relaxation).  Right Ventricle: The right ventricular size is mildly enlarged. No increase in right ventricular wall thickness. Right ventricular systolic function is normal. Left Atrium: Left atrial size was normal in size. Right Atrium: Right atrial size was normal in size. Pericardium: There is no evidence of pericardial effusion. Presence of epicardial fat layer. Mitral Valve: The mitral valve is normal in structure. No evidence of mitral valve regurgitation. No evidence of mitral valve stenosis. MV peak  gradient, 6.7 mmHg. The mean mitral valve gradient is 3.0 mmHg. Tricuspid Valve: The tricuspid valve is normal in structure. Tricuspid valve regurgitation is not demonstrated. No evidence of tricuspid stenosis. Aortic Valve: The aortic valve is normal in structure. There is mild thickening of the aortic valve. There is mild aortic valve annular calcification. Aortic valve regurgitation is trivial. Aortic valve sclerosis/calcification is present, without any evidence of aortic stenosis. Aortic valve mean gradient measures 3.0 mmHg. Aortic valve peak gradient measures 5.2 mmHg. Aortic valve area, by VTI measures 2.19 cm. Pulmonic Valve: The pulmonic valve was normal in structure. Pulmonic valve regurgitation is not visualized. No evidence of pulmonic stenosis. Aorta: The aortic root is normal in size and structure. Venous: The inferior vena cava is normal in size with greater than 50% respiratory variability, suggesting right atrial pressure of 3 mmHg. IAS/Shunts: No atrial level shunt detected by color flow Doppler.  LEFT VENTRICLE PLAX 2D LVIDd:         4.40 cm   Diastology LVIDs:         3.60 cm   LV e' medial:    6.66 cm/s LV PW:         1.20 cm   LV E/e' medial:  11.8 LV IVS:        1.10 cm   LV e' lateral:   12.80 cm/s LVOT diam:     2.00 cm   LV E/e' lateral: 6.1 LV SV:         53 LV SV Index:   25 LVOT Area:     3.14 cm  RIGHT VENTRICLE RV Basal diam:  3.60 cm RV Mid diam:    3.30 cm RV S prime:     9.99 cm/s TAPSE (M-mode): 2.0 cm LEFT ATRIUM           Index        RIGHT ATRIUM           Index LA diam:      4.00 cm 1.88 cm/m   RA Area:     19.20 cm LA Vol (A2C): 60.0 ml 28.23 ml/m  RA Volume:   51.70 ml  24.33 ml/m LA Vol (A4C): 52.4 ml 24.65 ml/m  AORTIC VALVE AV Area (Vmax):    2.38 cm AV Area (Vmean):   2.19 cm AV Area (VTI):     2.19 cm AV Vmax:           114.50 cm/s AV Vmean:          80.850 cm/s AV VTI:            0.240 m AV Peak Grad:      5.2 mmHg AV Mean Grad:      3.0 mmHg LVOT Vmax:          86.90 cm/s LVOT Vmean:        56.350 cm/s LVOT VTI:          0.167 m LVOT/AV VTI ratio: 0.70  AORTA Ao Root diam: 2.90 cm Ao Asc diam:  3.20  cm MITRAL VALVE                TRICUSPID VALVE MV Area (PHT): 3.85 cm     TR Peak grad:   4.5 mmHg MV Area VTI:   1.56 cm     TR Vmax:        106.00 cm/s MV Peak grad:  6.7 mmHg MV Mean grad:  3.0 mmHg     SHUNTS MV Vmax:       1.29 m/s     Systemic VTI:  0.17 m MV Vmean:      81.6 cm/s    Systemic Diam: 2.00 cm MV Decel Time: 197 msec MV E velocity: 78.40 cm/s MV A velocity: 110.00 cm/s MV E/A ratio:  0.71 Kardie Tobb DO Electronically signed by Kardie Tobb DO Signature Date/Time: 05/02/2024/4:38:54 PM    Final         Scheduled Meds:  atorvastatin   80 mg Oral QHS   donepezil   5 mg Oral QHS   escitalopram   10 mg Oral Daily   Gerhardt's butt cream   Topical TID   insulin  aspart  0-15 Units Subcutaneous Q4H   mirtazapine   7.5 mg Oral QHS   pantoprazole   40 mg Oral Daily   Continuous Infusions:  dextrose  5 % and 0.9 % NaCl with KCl 20 mEq/L 75 mL/hr at 05/04/24 0700   heparin  1,300 Units/hr (05/04/24 0700)   piperacillin -tazobactam (ZOSYN )  IV 12.5 mL/hr at 05/04/24 0700     LOS: 3 days      Marcelline Greet, PA-S [facility]    To contact the attending provider between 7A-7P or the covering provider during after hours 7P-7A, please log into the web site [email or web address] and access using universal [place] password for that web site. If you do not have the password, please call the hospital operator.  05/04/2024, 7:43 AM   "

## 2024-05-04 NOTE — Procedures (Signed)
 Interventional Radiology Procedure Note  Procedure: US  AND FLUORO CHOLECYSTOSTOMY    Complications: None  Estimated Blood Loss:  MIN  Findings: 10FR BILE CX SENT    EMERSON FREDERIC SPECKING, MD

## 2024-05-04 NOTE — Progress Notes (Signed)
 SLP Cancellation Note  Patient Details Name: John [name] MRN: [id number] DOB: [birth year]   Cancelled treatment:       Reason Eval/Treat Not Completed: unable to assess diet tolerance or determine readiness for advanced solids, as pt is currently NPO for possible procedures. Will continue efforts. RN aware.   John [name], MSP, CCC-SLP Speech Language Pathologist Office: [phone]  [name] 05/04/2024, 12:20 PM

## 2024-05-04 NOTE — Progress Notes (Signed)
" °  Progress Note   Patient: John [name] FMW:[id number] DOB: 27-Jun-1945 DOA: 05/01/2024     3 DOS: the patient was seen and examined on 05/04/2024 at 10:15AM      Brief hospital course: 79 y.o. M with sCHF EF 35-40%, CAD s/p PCI Apr 2025, DM, recurrent UTI and indwelling foley, streptococcal bacteremia last Sep, Atrial fibrillation and SSS and heart block s/p PPM, stroke with residual left weakness, history breast cancer, hx DVT, and dementia lives at home who presented with weakness and poor PO intake.  In the ER, noted to have gross hematuria and purulent urine in bag, hypotension and rapid Afib so admitted to ICU on pressors and antibiotics.     Assessment and Plan: Septic shock, CAUTI and acute cholecystitis - Continue Zosyn  - IR for perc chole - Consult Gen Surg, appreciate cares   Acute metabolic encephalopathy Dementia - Delirium precautions -Continue donepezil , Lexapro , Remeron   Coronary artery disease Cerebrovascular disease Hypertension Chronic systolic congestive heart failure Hemodynamically stable, no fluid overload on exam - Hold amlodipine  - Continue aspirin , heparin , Lipitor   Diabetes Glucose controlled - Hold Lantus  - Continue sliding scale corrections  Chronic indwelling Foley Foley changed 2 days prior to admission at facility  Chronic atrial fibrillation Sick sinus syndrome History of pacemaker - Continue heparin  gtt - Hold Eliquis   Hyponatremia Improved to normal with fluids  Hypokalemia Mag normal - Supplement K  AKI Cr 1.4 on admission, improved to 0.9 today.  Anemia Hgb stable, no bleeding        Subjective: Patient is still sluggish.  Nursing report no new concerns, no fever overnight, no respiratory symptoms.  No vomiting.     Physical Exam: BP 115/79   Pulse 96   Temp 98.1 F (36.7 C) (Oral)   Resp (!) 21   Ht 6' 0.01 (1.829 m)   Wt 94.1 kg   SpO2 98%   BMI 28.13 kg/m   Elderly adult male, appears  sluggish and weak Regular rate, irregular rhythm, no murmurs, no peripheral edema Respiratory rate normal, lungs clear without rales or wheezes Abdomen soft, grimace to palpation in the right upper quadrant, voluntary guarding throughout, no rigidity Attention diminished, affect blunted, face symmetric, speech slurred, oriented to self only, generalized weakness, worse in the left arm    Data Reviewed: Basic metabolic panel shows mild hypokalemia, CBC shows improving leukocytosis, stable anemia Discussed with Gen Surg     Family Communication: None at the bedside    Disposition: Status is: Inpatient         Author: Lonni SHAUNNA Dalton, MD 05/04/2024 5:51 PM  For on call review [email or web address].    "

## 2024-05-04 NOTE — Progress Notes (Signed)
 "      Subjective: Nonsensical moaning.  Unable to clearly tell me whether his abdomen hurts or not.  Had 5BMs on 1/21, no further yesterday.  Objective: Vital signs in last 24 hours: Temp:  [98.2 F (36.8 C)-99.6 F (37.6 C)] 99.1 F (37.3 C) (01/23 0733) Pulse Rate:  [80-108] 95 (01/23 0700) Resp:  [19-29] 24 (01/23 0700) BP: (107-141)/(55-99) 120/63 (01/23 0530) SpO2:  [93 %-99 %] 97 % (01/23 0700) Weight:  [94.1 kg] 94.1 kg (01/23 0500) Last BM Date : 05/03/24  Intake/Output from previous day: 01/22 0701 - 01/23 0700 In: 920.9 [I.V.:796.1; IV Piggyback:124.8] Out: 250 [Urine:250] Intake/Output this shift: Total I/O In: 100.2 [I.V.:88; IV Piggyback:12.2] Out: -   PE: Gen: NAD Abd: diffusely tender with voluntary guarding to palpation on the left and right side of his abdomen.  Lab Results:  Recent Labs    05/03/24 0650 05/04/24 0618  WBC 22.8* 16.5*  HGB 11.6* 11.1*  HCT 34.5* 32.5*  PLT 209 201   BMET Recent Labs    05/02/24 0401 05/03/24 0650  NA 130* 135  K 3.3* 3.4*  CL 93* 101  CO2 19* 24  GLUCOSE 145* 140*  BUN 27* 23  CREATININE 1.41* 0.90  CALCIUM  8.5* 8.9   PT/INR No results for input(s): LABPROT, INR in the last 72 hours. CMP     Component Value Date/Time   NA 135 05/03/2024 0650   K 3.4 (L) 05/03/2024 0650   CL 101 05/03/2024 0650   CO2 24 05/03/2024 0650   GLUCOSE 140 (H) 05/03/2024 0650   BUN 23 05/03/2024 0650   CREATININE 0.90 05/03/2024 0650   CREATININE 1.02 12/12/2019 0927   CALCIUM  8.9 05/03/2024 0650   PROT 5.9 (L) 05/01/2024 1540   ALBUMIN 3.0 (L) 05/01/2024 1540   AST 28 05/01/2024 1540   ALT 13 05/01/2024 1540   ALKPHOS 159 (H) 05/01/2024 1540   BILITOT 1.4 (H) 05/01/2024 1540   GFRNONAA >60 05/03/2024 0650   GFRAA >60 10/27/2019 0445   Lipase     Component Value Date/Time   LIPASE 16 05/01/2024 1540       Studies/Results: NM Hepatobiliary Liver Func Result Date: 05/03/2024 EXAM: NM HEPATOBILLARY  SCAN 05/03/2024 03:52:56 PM TECHNIQUE: RADIOPHARMACEUTICAL: 5.4 mCi Tc-62m mebrofenin  Dynamic images of the abdomen and pelvis were obtained in the anterior projection for 1 hour after intravenous administration of radiopharmaceutical. The gallbladder failed to fill at 45 minutes of imaging. Intravenous morphine  was administered to augment filling of the gallbladder. The gallbladder failed to fill after morphine  augmentation. Imaging continued for at least 30 minutes after morphine  administration. COMPARISON: CT 03/13/2025 and ultrasound 03/12/2025. CLINICAL HISTORY: Abdominal pain, acute, nonlocalized. Acute, nonlocalized abdominal pain. FINDINGS: Homogenous uptake within the liver. Normal clearance of the blood pool. The gallbladder failed to fill at 45 minutes of imaging. Intravenous morphine  was administered to augment filling of the gallbladder. The gallbladder failed to fill after morphine  augmentation. This is suggestive of acute cholecystitis. Appropriate excretion into the biliary system. Patent common bile duct. Activity is visualized in the small bowel. IMPRESSION: 1. Nonvisualization of the gallbladder despite morphine  augmentation, suggestive of acute cholecystitis. 2. Patent common bile duct. 3. These results will be called to the ordering clinician or representative by the radiologist assistant, and communication documented in the pacs or clario dashboard. Electronically signed by: Norleen Boxer MD 05/03/2024 04:00 PM EST RP Workstation: [id number]   US  Abdomen Limited RUQ (LIVER/GB) Result Date: 05/02/2024 EXAM: Right Upper Quadrant Abdominal  Ultrasound 05/02/2024 08:10:00 PM TECHNIQUE: Real-time ultrasonography of the right upper quadrant of the abdomen was performed. COMPARISON: US  Abdomen Limited 08/07/2023, and CT abdomen and pelvis with IV contrast 05/01/2024 07:09 PM. CLINICAL HISTORY: RUQ pain. FINDINGS: LIVER: Normal echogenicity. No intrahepatic biliary ductal dilatation. No evidence of  mass. Hepatopetal flow in the portal vein. Minimal perihepatic ascites is again noted. BILIARY SYSTEM: Dilatation of the gallbladder up to 11 cm in length is again noted, with sludge and stones in the proximal lumen, largest gallstone measuring 1.4 cm. Gallbladder wall thickening is seen along the gallbladder fossa up to 5 to 6 mm, but there was no positive sonographic Murphy sign, although technologist noted altered mental status and acute cholecystitis is not excluded. No pericholecystic fluid. The common bile duct measures 4.2 mm. IMPRESSION: 1. Dilatation of the gallbladder with sludge and stones, largest gallstone measuring 1.4 cm, and gallbladder wall thickening up to 5 to 6 mm without a positive sonographic Murphy sign in a patient with altered mental status according to the technologist ; acute cholecystitis is not excluded. 2. Minimal perihepatic ascites, without pericholecystic fluid. 3. No biliary dilatation . Electronically signed by: Francis Quam MD 05/02/2024 08:46 PM EST RP Workstation: [id number]   ECHOCARDIOGRAM COMPLETE Result Date: 05/02/2024    ECHOCARDIOGRAM REPORT   Patient Name:   John [name] Rewerts Date of Exam: 05/02/2024 Medical Rec #:  [id number]        Height:       72.0 in Accession #:    [id number]       Weight:       198.9 lb Date of Birth:  1945/05/18        BSA:          2.125 m Patient Age:    78 years         BP:           108/63 mmHg Patient Gender: M                HR:           72 bpm. Exam Location:  Inpatient Procedure: 2D Echo, Cardiac Doppler and Color Doppler (Both Spectral and Color            Flow Doppler were utilized during procedure). Indications:    Atrila Fibrillation  History:        Patient has prior history of Echocardiogram examinations, most                 recent 08/11/2023. CHF, Acute MI; Risk Factors:Dyslipidemia and                 Diabetes.  Sonographer:    Sherlean Dubin Referring Phys: [id number] OMAR M ALBUSTAMI IMPRESSIONS  1. Left ventricular ejection  fraction, by estimation, is 35 to 40%. The left ventricle has moderately decreased function. The left ventricle demonstrates global hypokinesis. There is mild concentric left ventricular hypertrophy. Left ventricular diastolic parameters are consistent with Grade I diastolic dysfunction (impaired relaxation).  2. Right ventricular systolic function is normal. The right ventricular size is mildly enlarged.  3. The mitral valve is normal in structure. No evidence of mitral valve regurgitation. No evidence of mitral stenosis.  4. The aortic valve is normal in structure. There is mild thickening of the aortic valve. Aortic valve regurgitation is trivial. Aortic valve sclerosis/calcification is present, without any evidence of aortic stenosis.  5. The inferior vena cava is normal in size with greater than 50% respiratory variability,  suggesting right atrial pressure of 3 mmHg. FINDINGS  Left Ventricle: Left ventricular ejection fraction, by estimation, is 35 to 40%. The left ventricle has moderately decreased function. The left ventricle demonstrates global hypokinesis. The left ventricular internal cavity size was normal in size. There is mild concentric left ventricular hypertrophy. Left ventricular diastolic parameters are consistent with Grade I diastolic dysfunction (impaired relaxation). Right Ventricle: The right ventricular size is mildly enlarged. No increase in right ventricular wall thickness. Right ventricular systolic function is normal. Left Atrium: Left atrial size was normal in size. Right Atrium: Right atrial size was normal in size. Pericardium: There is no evidence of pericardial effusion. Presence of epicardial fat layer. Mitral Valve: The mitral valve is normal in structure. No evidence of mitral valve regurgitation. No evidence of mitral valve stenosis. MV peak gradient, 6.7 mmHg. The mean mitral valve gradient is 3.0 mmHg. Tricuspid Valve: The tricuspid valve is normal in structure. Tricuspid valve  regurgitation is not demonstrated. No evidence of tricuspid stenosis. Aortic Valve: The aortic valve is normal in structure. There is mild thickening of the aortic valve. There is mild aortic valve annular calcification. Aortic valve regurgitation is trivial. Aortic valve sclerosis/calcification is present, without any evidence of aortic stenosis. Aortic valve mean gradient measures 3.0 mmHg. Aortic valve peak gradient measures 5.2 mmHg. Aortic valve area, by VTI measures 2.19 cm. Pulmonic Valve: The pulmonic valve was normal in structure. Pulmonic valve regurgitation is not visualized. No evidence of pulmonic stenosis. Aorta: The aortic root is normal in size and structure. Venous: The inferior vena cava is normal in size with greater than 50% respiratory variability, suggesting right atrial pressure of 3 mmHg. IAS/Shunts: No atrial level shunt detected by color flow Doppler.  LEFT VENTRICLE PLAX 2D LVIDd:         4.40 cm   Diastology LVIDs:         3.60 cm   LV e' medial:    6.66 cm/s LV PW:         1.20 cm   LV E/e' medial:  11.8 LV IVS:        1.10 cm   LV e' lateral:   12.80 cm/s LVOT diam:     2.00 cm   LV E/e' lateral: 6.1 LV SV:         53 LV SV Index:   25 LVOT Area:     3.14 cm  RIGHT VENTRICLE RV Basal diam:  3.60 cm RV Mid diam:    3.30 cm RV S prime:     9.99 cm/s TAPSE (M-mode): 2.0 cm LEFT ATRIUM           Index        RIGHT ATRIUM           Index LA diam:      4.00 cm 1.88 cm/m   RA Area:     19.20 cm LA Vol (A2C): 60.0 ml 28.23 ml/m  RA Volume:   51.70 ml  24.33 ml/m LA Vol (A4C): 52.4 ml 24.65 ml/m  AORTIC VALVE AV Area (Vmax):    2.38 cm AV Area (Vmean):   2.19 cm AV Area (VTI):     2.19 cm AV Vmax:           114.50 cm/s AV Vmean:          80.850 cm/s AV VTI:            0.240 m AV Peak Grad:      5.2  mmHg AV Mean Grad:      3.0 mmHg LVOT Vmax:         86.90 cm/s LVOT Vmean:        56.350 cm/s LVOT VTI:          0.167 m LVOT/AV VTI ratio: 0.70  AORTA Ao Root diam: 2.90 cm Ao Asc diam:   3.20 cm MITRAL VALVE                TRICUSPID VALVE MV Area (PHT): 3.85 cm     TR Peak grad:   4.5 mmHg MV Area VTI:   1.56 cm     TR Vmax:        106.00 cm/s MV Peak grad:  6.7 mmHg MV Mean grad:  3.0 mmHg     SHUNTS MV Vmax:       1.29 m/s     Systemic VTI:  0.17 m MV Vmean:      81.6 cm/s    Systemic Diam: 2.00 cm MV Decel Time: 197 msec MV E velocity: 78.40 cm/s MV A velocity: 110.00 cm/s MV E/A ratio:  0.71 Kardie Tobb DO Electronically signed by Dub Huntsman DO Signature Date/Time: 05/02/2024/4:38:54 PM    Final     Anti-infectives: Anti-infectives (From admission, onward)    Start     Dose/Rate Route Frequency Ordered Stop   05/03/24 2200  piperacillin -tazobactam (ZOSYN ) IVPB 3.375 g       Placed in Followed by Linked Group   3.375 g 12.5 mL/hr over 240 Minutes Intravenous Every 8 hours 05/03/24 1639     05/03/24 1730  piperacillin -tazobactam (ZOSYN ) IVPB 3.375 g       Placed in Followed by Linked Group   3.375 g 100 mL/hr over 30 Minutes Intravenous  Once 05/03/24 1639 05/03/24 1744   05/03/24 0945  ampicillin  (OMNIPEN) 1 g in sodium chloride  0.9 % 100 mL IVPB  Status:  Discontinued        1 g 300 mL/hr over 20 Minutes Intravenous Every 6 hours 05/03/24 0859 05/03/24 1623   05/02/24 2200  meropenem  (MERREM ) 1 g in sodium chloride  0.9 % 100 mL IVPB  Status:  Discontinued        1 g 200 mL/hr over 30 Minutes Intravenous Every 12 hours 05/02/24 1325 05/03/24 0859   05/02/24 1015  meropenem  (MERREM ) 2 g in sodium chloride  0.9 % 100 mL IVPB  Status:  Discontinued        2 g 280 mL/hr over 30 Minutes Intravenous Every 12 hours 05/02/24 0922 05/02/24 1325   05/01/24 2100  ceFEPIme  (MAXIPIME ) 2 g in sodium chloride  0.9 % 100 mL IVPB  Status:  Discontinued        2 g 200 mL/hr over 30 Minutes Intravenous Every 12 hours 05/01/24 2038 05/02/24 0917   05/01/24 1700  vancomycin  (VANCOREADY) IVPB 1500 mg/300 mL        1,500 mg 150 mL/hr over 120 Minutes Intravenous  Once 05/01/24 1649  05/01/24 2050   05/01/24 1700  meropenem  (MERREM ) 1 g in sodium chloride  0.9 % 100 mL IVPB        1 g 200 mL/hr over 30 Minutes Intravenous NOW 05/01/24 1651 05/01/24 1755   05/01/24 1645  ceFEPIme  (MAXIPIME ) 2 g in sodium chloride  0.9 % 100 mL IVPB  Status:  Discontinued        2 g 200 mL/hr over 30 Minutes Intravenous  Once 05/01/24 1641 05/01/24 1650   05/01/24 1645  metroNIDAZOLE  (  FLAGYL ) IVPB 500 mg  Status:  Discontinued        500 mg 100 mL/hr over 60 Minutes Intravenous  Once 05/01/24 1641 05/01/24 1650   05/01/24 1645  vancomycin  (VANCOCIN ) IVPB 1000 mg/200 mL premix  Status:  Discontinued        1,000 mg 200 mL/hr over 60 Minutes Intravenous  Once 05/01/24 1641 05/01/24 1649        Assessment/Plan Cholecystitis -WBC improved down to 16K today,  AF, VSS -patient however is quite tender on exam for me this am.  This is difficult due to his dementia, I'm not sure how reliable his abdominal exam is.   -objectively, patient seems to be improving, but clinical exam is still concerning.  Will discussed with MD regarding plans to move forward -noted yesterday wife wasn't crazy about the idea of a perc chole tube that may remain long-term; however, surgery is incredibly high risk for this patient as well.  Cont abx therapy for now  -wife not at bedside.  Can update later after further discussion with MD  FEN - NPO VTE - heparin  ID - zosyn   Dementia DM HTN GERD HLD CVA A fib - Eliquis  on hold Complete heart block - s/p pacemaker CHF - EF 35-40%  I reviewed nursing notes, hospitalist notes, last 24 h vitals and pain scores, last 48 h intake and output, last 24 h labs and trends, and last 24 h imaging results.   LOS: 3 days    Burnard FORBES John [name] , [facility] 05/04/2024, 8:18 AM Please see Amion for pager number during day hours 7:00am-4:30pm or 7:00am -11:30am on weekends  "

## 2024-05-04 NOTE — Progress Notes (Signed)
 Report called to receiving Rn [id number]. Spouse at bedside. Will transfer via bed.

## 2024-05-04 NOTE — Plan of Care (Signed)
  Problem: Health Behavior/Discharge Planning: Goal: Ability to manage health-related needs will improve Outcome: Not Progressing   Problem: Nutritional: Goal: Maintenance of adequate nutrition will improve Outcome: Not Progressing

## 2024-05-04 NOTE — Progress Notes (Signed)
 Pharmacy Consult for Heparin   Indication: atrial fibrillation   Allergies[1]  Patient Measurements: Height: 6' 0.01 (182.9 cm) Weight: 94.1 kg (207 lb 7.3 oz) IBW/kg (Calculated) : 77.62 HEPARIN  DW (KG): 90.2  Vital Signs: Temp: 99.1 F (37.3 C) (01/23 0733) Temp Source: Oral (01/23 0733) BP: 120/63 (01/23 0530) Pulse Rate: 95 (01/23 0700)  Labs: Recent Labs    05/01/24 1620 05/01/24 2049 05/02/24 0401 05/03/24 0650 05/04/24 0618  HGB 11.6*   < > 11.3* 11.6* 11.1*  HCT 34.0*   < > 33.8* 34.5* 32.5*  PLT  --   --  289 209 201  APTT  --   --   --   --  99*  CREATININE 1.20  --  1.41* 0.90  --    < > = values in this interval not displayed.    Estimated Creatinine Clearance: 81.9 mL/min (by C-G formula based on SCr of 0.9 mg/dL).  Assessment: John [name] a 79 y.o. male presented with sepsis, on apixaban  for atrial fibrillation. LD 1/22 1144.  Pharmacy has been consulted for transition from apixaban >>heparin  dosing in anticipation of OR for IAI. Hgb 11.6, PLT 209.   aPTT = 99 this AM which is therapeutic at upper end of range.  Hb 11.1, plt 201 (Stable). No bleeding or other issues reported by RN.   Goal of Therapy:  aPTT 66-102  Heparin  level 0.3-0.7 units/ml Monitor platelets by anticoagulation protocol: Yes   Plan:  Continue heparin , small adjustment down to 1250 units/hr.  8-hour confirmatory aPTT  Patient will require HL/aPTT monitoring until correlating, daily CBC  F/U OR plans, transition back to DOAC   John [name], PharmD, BCCCP Clinical Pharmacist      [1]  Allergies Allergen Reactions   Ramipril Anaphylaxis   Chlorhexidine     Adhesive [Tape] Rash   Other Diarrhea    Severe intolerance to Chemotherapy in the past.   Sertraline Other (See Comments)    Extreme headaches

## 2024-05-04 NOTE — Progress Notes (Signed)
 Pharmacy Consult for Heparin   Indication: atrial fibrillation   Allergies[1]  Patient Measurements: Height: 6' 0.01 (182.9 cm) Weight: 94.1 kg (207 lb 7.3 oz) IBW/kg (Calculated) : 77.62 HEPARIN  DW (KG): 90.2  Vital Signs: Temp: 98.1 F (36.7 C) (01/23 1512) Temp Source: Oral (01/23 1512) BP: 115/79 (01/23 1630) Pulse Rate: 96 (01/23 1630)  Labs: Recent Labs    05/02/24 0401 05/03/24 0650 05/04/24 0618 05/04/24 0958 05/04/24 1523  HGB 11.3* 11.6* 11.1*  --   --   HCT 33.8* 34.5* 32.5*  --   --   PLT 289 209 201  --   --   APTT  --   --  99*  --   --   LABPROT  --   --   --   --  18.2*  INR  --   --   --   --  1.4*  CREATININE 1.41* 0.90  --  0.95  --     Estimated Creatinine Clearance: 77.6 mL/min (by C-G formula based on SCr of 0.95 mg/dL).  Assessment: John [name] a 79 y.o. male presented with sepsis, on apixaban  for atrial fibrillation. LD 1/22 1144.  Pharmacy has been consulted for transition from apixaban >>heparin  dosing in anticipation of OR for IAI. Hgb 11.6, PLT 209.   aPTT = 99 this AM which is therapeutic at upper end of range.  Hb 11.1, plt 201 (Stable). No bleeding or other issues reported by RN.   1/23 PM: Post procedure plan to restart heparin  infusion at 1250 units/hr at 2100.  Goal of Therapy:  aPTT 66-102  Heparin  level 0.3-0.7 units/ml Monitor platelets by anticoagulation protocol: Yes   Plan:  Restart heparin  1250 units/hr. At 2100. AM aPTT/heparin  level Patient will require HL/aPTT monitoring until correlating, daily CBC  F/U OR plans, transition back to DOAC   Larraine Brazier, PharmD Clinical Pharmacist 05/04/2024  4:47 PM **Pharmacist phone directory can now be found on [email or web address] ([email or web address]).  Listed under [facility].       [1]  Allergies Allergen Reactions   Ramipril Anaphylaxis   Chlorhexidine     Adhesive [Tape] Rash   Other Diarrhea    Severe intolerance to Chemotherapy in the past.   Sertraline Other (See Comments)     Extreme headaches

## 2024-05-05 DIAGNOSIS — A409 Streptococcal sepsis, unspecified: Secondary | ICD-10-CM | POA: Diagnosis not present

## 2024-05-05 LAB — CBC
HCT: 30.6 % — ABNORMAL LOW (ref 39.0–52.0)
Hemoglobin: 10.2 g/dL — ABNORMAL LOW (ref 13.0–17.0)
MCH: 28.1 pg (ref 26.0–34.0)
MCHC: 33.3 g/dL (ref 30.0–36.0)
MCV: 84.3 fL (ref 80.0–100.0)
Platelets: 182 10*3/uL (ref 150–400)
RBC: 3.63 MIL/uL — ABNORMAL LOW (ref 4.22–5.81)
RDW: 15.3 % (ref 11.5–15.5)
WBC: 14.7 10*3/uL — ABNORMAL HIGH (ref 4.0–10.5)
nRBC: 0 % (ref 0.0–0.2)

## 2024-05-05 LAB — APTT
aPTT: 65 s — ABNORMAL HIGH (ref 24–36)
aPTT: 75 s — ABNORMAL HIGH (ref 24–36)

## 2024-05-05 LAB — GLUCOSE, CAPILLARY
Glucose-Capillary: 120 mg/dL — ABNORMAL HIGH (ref 70–99)
Glucose-Capillary: 134 mg/dL — ABNORMAL HIGH (ref 70–99)
Glucose-Capillary: 143 mg/dL — ABNORMAL HIGH (ref 70–99)
Glucose-Capillary: 148 mg/dL — ABNORMAL HIGH (ref 70–99)
Glucose-Capillary: 156 mg/dL — ABNORMAL HIGH (ref 70–99)
Glucose-Capillary: 159 mg/dL — ABNORMAL HIGH (ref 70–99)
Glucose-Capillary: 95 mg/dL (ref 70–99)

## 2024-05-05 LAB — COMPREHENSIVE METABOLIC PANEL WITH GFR
ALT: 39 U/L (ref 0–44)
AST: 74 U/L — ABNORMAL HIGH (ref 15–41)
Albumin: 2 g/dL — ABNORMAL LOW (ref 3.5–5.0)
Alkaline Phosphatase: 350 U/L — ABNORMAL HIGH (ref 38–126)
Anion gap: 9 (ref 5–15)
BUN: 28 mg/dL — ABNORMAL HIGH (ref 8–23)
CO2: 25 mmol/L (ref 22–32)
Calcium: 8.8 mg/dL — ABNORMAL LOW (ref 8.9–10.3)
Chloride: 106 mmol/L (ref 98–111)
Creatinine, Ser: 0.86 mg/dL (ref 0.61–1.24)
GFR, Estimated: >60 mL/min
Glucose, Bld: 131 mg/dL — ABNORMAL HIGH (ref 70–99)
Potassium: 3.5 mmol/L (ref 3.5–5.1)
Sodium: 140 mmol/L (ref 135–145)
Total Bilirubin: 0.8 mg/dL (ref 0.0–1.2)
Total Protein: 4.8 g/dL — ABNORMAL LOW (ref 6.5–8.1)

## 2024-05-05 LAB — HEPARIN LEVEL (UNFRACTIONATED): Heparin Unfractionated: >1.1 [IU]/mL — ABNORMAL HIGH (ref 0.30–0.70)

## 2024-05-05 MED ORDER — MORPHINE SULFATE (PF) 2 MG/ML IV SOLN
2.0000 mg | Freq: Once | INTRAVENOUS | Status: AC
  Administered 2024-05-06: 2 mg via INTRAVENOUS
  Filled 2024-05-05: qty 1

## 2024-05-05 NOTE — Progress Notes (Signed)
 Pharmacy Consult for Heparin   Indication: atrial fibrillation   Allergies[1]  Patient Measurements: Height: 6' 0.01 (182.9 cm) Weight: 88.7 kg (195 lb 8.8 oz) IBW/kg (Calculated) : 77.62 HEPARIN  DW (KG): 90.2  Vital Signs: Temp: 97.9 F (36.6 C) (01/24 0411) Temp Source: Oral (01/24 0411) BP: 124/63 (01/24 0411)  Labs: Recent Labs    05/03/24 0650 05/04/24 0618 05/04/24 0958 05/04/24 1523 05/05/24 0233  HGB 11.6* 11.1*  --   --  10.2*  HCT 34.5* 32.5*  --   --  30.6*  PLT 209 201  --   --  182  APTT  --  99*  --   --  65*  LABPROT  --   --   --  18.2*  --   INR  --   --   --  1.4*  --   HEPARINUNFRC  --   --   --   --  >1.10*  CREATININE 0.90  --  0.95  --  0.86    Estimated Creatinine Clearance: 79 mL/min (by C-G formula based on SCr of 0.86 mg/dL).  Assessment: John [name] a 79 y.o. male presented with sepsis, on apixaban  for atrial fibrillation. LD 1/22 1144.  Pharmacy has been consulted for transition from apixaban >>heparin  dosing in anticipation of OR for IAI. Hgb 11.6, PLT 209.   01/24 AM: aPTT = 65 this AM which is therapeutic.  Hb 10.2, plt 182 (Stable). No bleeding or other issues reported by RN.   Goal of Therapy:  aPTT 66-102  Heparin  level 0.3-0.7 units/ml Monitor platelets by anticoagulation protocol: Yes   Plan:  Continue heparin  at 1250 units/hr Confirmatory aPTT in 8 hours Patient will require daily HL/aPTT monitoring until correlating, daily CBC  F/U OR plans, transition back to DOAC   R. Samual Satterfield, PharmD, RPh PGY-1 Acute Care Pharmacy Resident [facility] Please refer to AMION for [facility] numbers 05/05/2024 7:37 AM        [1]  Allergies Allergen Reactions   Ramipril Anaphylaxis   Chlorhexidine     Adhesive [Tape] Rash   Other Diarrhea    Severe intolerance to Chemotherapy in the past.   Sertraline Other (See Comments)    Extreme headaches

## 2024-05-05 NOTE — Progress Notes (Signed)
" °  Progress Note   Patient: John [name] FMW:[id number] DOB: [birth year] DOA: 05/01/2024     4 DOS: the patient was seen and examined on 05/05/2024 at 10:41AM      Brief hospital course: 78 y.o. M with sCHF EF 35-40%, CAD s/p PCI Apr 2025, DM, recurrent UTI and indwelling foley, streptococcal bacteremia last Sep, Atrial fibrillation and SSS and heart block s/p PPM, stroke with residual left weakness, history breast cancer, hx DVT, and dementia lives at home who presented with weakness and poor PO intake.  In the ER, noted to have gross hematuria and purulent urine in bag, hypotension and rapid Afib so admitted to ICU on pressors and antibiotics.     Assessment and Plan: Septic shock, CAUTI and acute cholecystitis Presented with tachycardia, leukocytosis, encephalopathy, lactic acid 5.4, renal failure, and hypotension requiring pressors.  Blood cultures negative, urine culture growing VRE, CT imaging, follow-up HIDA scan confirmed cholecystitis  General Surgery consult, percutaneous cholecystostomy tube placed 1/23 Clinically improving, white count improving - Continue Zosyn , day 3 of 7 - Follow-up with IR for cholangiogram post discharge in 4 weeks - Follow-up with general surgery in 6 weeks - Follow-up with cardiology within 4 weeks for preoperative risk assessment prior to surgical gallbladder removal    Acute metabolic encephalopathy Dementia Unchanged - Delirium precautions - Continue escitalopram , mirtazapine , donepezil   Coronary artery disease Cerebrovascular disease Hypertension Chronic systolic congestive heart failure Blood pressure normal, hemodynamically normal, no fluid overload - Hold amlodipine  - Continue aspirin , Lipitor  - Continue heparin  infusion for now, if stable tomorrow transition back to home Eliquis    Diabetes Glucose normal - Hold Lantus  - Continue sliding scale corrections  Chronic indwelling Foley Foley changed 2 days prior to admission at  facility  Chronic atrial fibrillation Sick sinus syndrome History of pacemaker See above - Continue heparin  drip - Hold Eliquis   Hyponatremia Improved to normal with fluids  Hypokalemia Supplemented and resolved  AKI Cr 1.4 on admission, improved to 0.9 today.  Anemia Hgb stable, no bleeding        Subjective: Percutaneous cholecystostomy tube placed yesterday, gradually improving today.  Still encephalopathic     Physical Exam: BP 131/61 (BP Location: Left Arm)   Pulse 78   Temp 98 F (36.7 C) (Oral)   Resp 19   Ht 6' 0.01 (1.829 m)   Wt 88.7 kg   SpO2 94%   BMI 26.52 kg/m   Elderly adult male, remains sluggish and weak Rate controlled, irregular, no murmurs, no peripheral edema Respiratory normal, lungs clear, no rales or wheezes Abdomen soft, less grimace to palpation in the right upper quadrant, no guarding Attention diminished, affect blunted, oriented to self, psychomotor slowing, speech slurred but following commands, still weak in the left side   Data Reviewed: Basic metabolic panel unremarkable CBC shows resolving leukocytosis     Family Communication: None at the bedside    Disposition: Status is: Inpatient         Author: Lonni SHAUNNA Dalton, MD 05/05/2024 2:31 PM  For on call review [email or web address]    "

## 2024-05-05 NOTE — Progress Notes (Signed)
 "      Subjective: CC: S/p Perc Chole yesterday.  Mild RUQ pain. Tolerating FLD. No n/v.  Afebrile. WBC downtrending.   Objective: Vital signs in last 24 hours: Temp:  [97.5 F (36.4 C)-100.1 F (37.8 C)] 98 F (36.7 C) (01/24 1114) Pulse Rate:  [78-104] 78 (01/24 1114) Resp:  [15-36] 19 (01/24 1114) BP: (112-150)/(61-81) 131/61 (01/24 1114) SpO2:  [94 %-99 %] 94 % (01/24 1114) Weight:  [88.7 kg] 88.7 kg (01/24 0411) Last BM Date : 05/04/24  Intake/Output from previous day: 01/23 0701 - 01/24 0700 In: 756.9 [I.V.:713.8; IV Piggyback:33.1] Out: 340 [Urine:225; Drains:115] Intake/Output this shift: No intake/output data recorded.  PE: Gen:  Alert, NAD, pleasant HEENT: EOM's intact, pupils equal and round Card:  RRR Pulm:  CTAB, no W/R/R, effort normal Abd: Soft, ND, mild RUQ ttp around drain, perc chole w/ bile tinged ss output -  115cc/24 hours  Lab Results:  Recent Labs    05/04/24 0618 05/05/24 0233  WBC 16.5* 14.7*  HGB 11.1* 10.2*  HCT 32.5* 30.6*  PLT 201 182   BMET Recent Labs    05/04/24 0958 05/05/24 0233  NA 138 140  K 3.9 3.5  CL 104 106  CO2 24 25  GLUCOSE 164* 131*  BUN 26* 28*  CREATININE 0.95 0.86  CALCIUM  8.8* 8.8*   PT/INR Recent Labs    05/04/24 1523  LABPROT 18.2*  INR 1.4*   CMP     Component Value Date/Time   NA 140 05/05/2024 0233   K 3.5 05/05/2024 0233   CL 106 05/05/2024 0233   CO2 25 05/05/2024 0233   GLUCOSE 131 (H) 05/05/2024 0233   BUN 28 (H) 05/05/2024 0233   CREATININE 0.86 05/05/2024 0233   CREATININE 1.02 12/12/2019 0927   CALCIUM  8.8 (L) 05/05/2024 0233   PROT 4.8 (L) 05/05/2024 0233   ALBUMIN 2.0 (L) 05/05/2024 0233   AST 74 (H) 05/05/2024 0233   ALT 39 05/05/2024 0233   ALKPHOS 350 (H) 05/05/2024 0233   BILITOT 0.8 05/05/2024 0233   GFRNONAA >60 05/05/2024 0233   GFRAA >60 10/27/2019 0445   Lipase     Component Value Date/Time   LIPASE 16 05/01/2024 1540    Studies/Results: IR Perc  Cholecystostomy Result Date: 05/04/2024 INDICATION: Acute cholecystitis EXAM: ULTRASOUND AND FLUOROSCOPIC 10 FRENCH PERCUTANEOUS TRANSHEPATIC CHOLECYSTOSTOMY MEDICATIONS: Patient is already receiving Zosyn  IV as an inpatient; The antibiotic was administered within an appropriate time frame prior to the initiation of the procedure. ANESTHESIA/SEDATION: Moderate (conscious) sedation was employed during this procedure. A total of Versed  1.0 mg and Fentanyl  50 mcg was administered intravenously by the radiology nurse. Total intra-service moderate Sedation Time: 14 minutes. The patient's level of consciousness and vital signs were monitored continuously by radiology nursing throughout the procedure under my direct supervision. FLUOROSCOPY: Radiation Exposure Index (as provided by the fluoroscopic device): 5.0 mGy Kerma COMPLICATIONS: None immediate. PROCEDURE: Informed written consent was obtained from the patient after a thorough discussion of the procedural risks, benefits and alternatives. All questions were addressed. Maximal Sterile Barrier Technique was utilized including caps, mask, sterile gowns, sterile gloves, sterile drape, hand hygiene and skin antiseptic. A timeout was performed prior to the initiation of the procedure. Previous imaging reviewed. Preliminary ultrasound performed. The distended gallbladder was localized and marked through a lower intercostal space in the mid axillary line. Under sterile conditions and local anesthesia, an 18 gauge needle was advanced percutaneously through a transhepatic window into the gallbladder. There was  return of bile. Sample sent for culture. Guidewire inserted followed by tract dilatation to insert a 10 French drain. Retention loop formed in the gallbladder. Gallbladder was decompressed by syringe aspiration yielding proximally 200 cc. Position confirmed with fluoroscopy as well. Catheter secured with a silk suture and a sterile dressing. Gravity drainage bag  connected. No immediate complication. Patient tolerated the procedure well. IMPRESSION: Successful ultrasound and fluoroscopic 10 French percutaneous transhepatic cholecystostomy Electronically Signed   By: CHRISTELLA.  Shick M.D.   On: 05/04/2024 16:41   NM Hepatobiliary Liver Func Result Date: 05/03/2024 EXAM: NM HEPATOBILLARY SCAN 05/03/2024 03:52:56 PM TECHNIQUE: RADIOPHARMACEUTICAL: 5.4 mCi Tc-20m mebrofenin  Dynamic images of the abdomen and pelvis were obtained in the anterior projection for 1 hour after intravenous administration of radiopharmaceutical. The gallbladder failed to fill at 45 minutes of imaging. Intravenous morphine  was administered to augment filling of the gallbladder. The gallbladder failed to fill after morphine  augmentation. Imaging continued for at least 30 minutes after morphine  administration. COMPARISON: CT 03/13/2025 and ultrasound 03/12/2025. CLINICAL HISTORY: Abdominal pain, acute, nonlocalized. Acute, nonlocalized abdominal pain. FINDINGS: Homogenous uptake within the liver. Normal clearance of the blood pool. The gallbladder failed to fill at 45 minutes of imaging. Intravenous morphine  was administered to augment filling of the gallbladder. The gallbladder failed to fill after morphine  augmentation. This is suggestive of acute cholecystitis. Appropriate excretion into the biliary system. Patent common bile duct. Activity is visualized in the small bowel. IMPRESSION: 1. Nonvisualization of the gallbladder despite morphine  augmentation, suggestive of acute cholecystitis. 2. Patent common bile duct. 3. These results will be called to the ordering clinician or representative by the radiologist assistant, and communication documented in the pacs or clario dashboard. Electronically signed by: Norleen Boxer MD 05/03/2024 04:00 PM EST RP Workstation: [id number]    Anti-infectives: Anti-infectives (From admission, onward)    Start     Dose/Rate Route Frequency Ordered Stop   05/03/24 2200   piperacillin -tazobactam (ZOSYN ) IVPB 3.375 g       Placed in Followed by Linked Group   3.375 g 12.5 mL/hr over 240 Minutes Intravenous Every 8 hours 05/03/24 1639     05/03/24 1730  piperacillin -tazobactam (ZOSYN ) IVPB 3.375 g       Placed in Followed by Linked Group   3.375 g 100 mL/hr over 30 Minutes Intravenous  Once 05/03/24 1639 05/03/24 1744   05/03/24 0945  ampicillin  (OMNIPEN) 1 g in sodium chloride  0.9 % 100 mL IVPB  Status:  Discontinued        1 g 300 mL/hr over 20 Minutes Intravenous Every 6 hours 05/03/24 0859 05/03/24 1623   05/02/24 2200  meropenem  (MERREM ) 1 g in sodium chloride  0.9 % 100 mL IVPB  Status:  Discontinued        1 g 200 mL/hr over 30 Minutes Intravenous Every 12 hours 05/02/24 1325 05/03/24 0859   05/02/24 1015  meropenem  (MERREM ) 2 g in sodium chloride  0.9 % 100 mL IVPB  Status:  Discontinued        2 g 280 mL/hr over 30 Minutes Intravenous Every 12 hours 05/02/24 0922 05/02/24 1325   05/01/24 2100  ceFEPIme  (MAXIPIME ) 2 g in sodium chloride  0.9 % 100 mL IVPB  Status:  Discontinued        2 g 200 mL/hr over 30 Minutes Intravenous Every 12 hours 05/01/24 2038 05/02/24 0917   05/01/24 1700  vancomycin  (VANCOREADY) IVPB 1500 mg/300 mL        1,500 mg 150 mL/hr over 120 Minutes  Intravenous  Once 05/01/24 1649 05/01/24 2050   05/01/24 1700  meropenem  (MERREM ) 1 g in sodium chloride  0.9 % 100 mL IVPB        1 g 200 mL/hr over 30 Minutes Intravenous NOW 05/01/24 1651 05/01/24 1755   05/01/24 1645  ceFEPIme  (MAXIPIME ) 2 g in sodium chloride  0.9 % 100 mL IVPB  Status:  Discontinued        2 g 200 mL/hr over 30 Minutes Intravenous  Once 05/01/24 1641 05/01/24 1650   05/01/24 1645  metroNIDAZOLE  (FLAGYL ) IVPB 500 mg  Status:  Discontinued        500 mg 100 mL/hr over 60 Minutes Intravenous  Once 05/01/24 1641 05/01/24 1650   05/01/24 1645  vancomycin  (VANCOCIN ) IVPB 1000 mg/200 mL premix  Status:  Discontinued        1,000 mg 200 mL/hr over 60 Minutes  Intravenous  Once 05/01/24 1641 05/01/24 1649        Assessment/Plan Cholecystitis - S/p perc chole 1/23, drain flushes per IR - Cont abx, cx pending.  - Monitor LFT's. Will repeat in AM to ensure downtrending. T. Bili wnl - Patient will need f/u with IR for Cholangiogram before follow up with us  in the office in ~6 weeks to discuss next steps including possible cholecystectomy. Before this appointment, he will need to be seen by cardiology for peri-operative risk assessment. We will arrange follow up in our office.  - We will plan to chart check tomorrow to ensure LFT's are downtrending. Please call back sooner with any questions or concerns.    FEN - FLD, okay to adv from our standpoint, defer to primary and SLP VTE - heparin  gtt ID - zosyn , recommend 4d of abx from source control w/ IR Perc Chole from our standpoint but defer final duration to primary team given he is also being tx for UTI   Dementia DM HTN GERD HLD CVA A fib - Eliquis  on hold Complete heart block - s/p pacemaker CHF - EF 35-40%  I reviewed nursing notes, Consultant (IR) notes, hospitalist notes, last 24 h vitals and pain scores, last 48 h intake and output, last 24 h labs and trends, and last 24 h imaging results.    LOS: 4 days    [name] CHRISTELLA Shaper, [facility] 05/05/2024, 12:22 PM Please see Amion for pager number during day hours 7:00am-4:30pm  "

## 2024-05-05 NOTE — Progress Notes (Signed)
 Pharmacy Consult for Heparin   Indication: atrial fibrillation   Allergies[1]  Patient Measurements: Height: 6' 0.01 (182.9 cm) Weight: 88.7 kg (195 lb 8.8 oz) IBW/kg (Calculated) : 77.62 HEPARIN  DW (KG): 90.2  Vital Signs: Temp: 98 F (36.7 C) (01/24 1114) Temp Source: Oral (01/24 1114) BP: 131/61 (01/24 1114) Pulse Rate: 78 (01/24 1114)  Labs: Recent Labs    05/03/24 0650 05/04/24 0618 05/04/24 0958 05/04/24 1523 05/05/24 0233 05/05/24 1445  HGB 11.6* 11.1*  --   --  10.2*  --   HCT 34.5* 32.5*  --   --  30.6*  --   PLT 209 201  --   --  182  --   APTT  --  99*  --   --  65* 75*  LABPROT  --   --   --  18.2*  --   --   INR  --   --   --  1.4*  --   --   HEPARINUNFRC  --   --   --   --  >1.10*  --   CREATININE 0.90  --  0.95  --  0.86  --     Estimated Creatinine Clearance: 79 mL/min (by C-G formula based on SCr of 0.86 mg/dL).  Assessment: John [name] a 79 y.o. male presented with sepsis, on apixaban  for atrial fibrillation. LD 1/22 1144.  Pharmacy has been consulted for transition from apixaban >>heparin  dosing in anticipation of OR for IAI. Hgb 11.6, PLT 209.   01/24 PKM: aPTT = 75 this PM which is therapeutic.  Hb 10.2, plt 182 (Stable). No bleeding or other issues reported by RN.   Goal of Therapy:  aPTT 66-102  Heparin  level 0.3-0.7 units/ml Monitor platelets by anticoagulation protocol: Yes   Plan:  Continue heparin  at 1250 units/hr Patient will require daily HL/aPTT monitoring until correlating, daily CBC  F/U OR plans, transition back to DOAC   R. Samual Satterfield, PharmD, RPh PGY-1 Acute Care Pharmacy Resident [facility] Please refer to AMION for [facility] Pharmacy numbers 05/05/2024 3:32 PM         [1]  Allergies Allergen Reactions   Ramipril Anaphylaxis   Chlorhexidine     Adhesive [Tape] Rash   Other Diarrhea    Severe intolerance to Chemotherapy in the past.   Sertraline Other (See Comments)    Extreme headaches

## 2024-05-06 DIAGNOSIS — A409 Streptococcal sepsis, unspecified: Secondary | ICD-10-CM | POA: Diagnosis not present

## 2024-05-06 LAB — CULTURE, BLOOD (ROUTINE X 2)
Culture: NO GROWTH
Special Requests: ADEQUATE

## 2024-05-06 LAB — CBC
HCT: 30.9 % — ABNORMAL LOW (ref 39.0–52.0)
Hemoglobin: 10.1 g/dL — ABNORMAL LOW (ref 13.0–17.0)
MCH: 27.9 pg (ref 26.0–34.0)
MCHC: 32.7 g/dL (ref 30.0–36.0)
MCV: 85.4 fL (ref 80.0–100.0)
Platelets: 169 10*3/uL (ref 150–400)
RBC: 3.62 MIL/uL — ABNORMAL LOW (ref 4.22–5.81)
RDW: 15.5 % (ref 11.5–15.5)
WBC: 13.5 10*3/uL — ABNORMAL HIGH (ref 4.0–10.5)
nRBC: 0 % (ref 0.0–0.2)

## 2024-05-06 LAB — COMPREHENSIVE METABOLIC PANEL WITH GFR
ALT: 30 U/L (ref 0–44)
AST: 59 U/L — ABNORMAL HIGH (ref 15–41)
Albumin: 1.9 g/dL — ABNORMAL LOW (ref 3.5–5.0)
Alkaline Phosphatase: 430 U/L — ABNORMAL HIGH (ref 38–126)
Anion gap: 10 (ref 5–15)
BUN: 28 mg/dL — ABNORMAL HIGH (ref 8–23)
CO2: 23 mmol/L (ref 22–32)
Calcium: 8.8 mg/dL — ABNORMAL LOW (ref 8.9–10.3)
Chloride: 104 mmol/L (ref 98–111)
Creatinine, Ser: 0.85 mg/dL (ref 0.61–1.24)
GFR, Estimated: >60 mL/min
Glucose, Bld: 168 mg/dL — ABNORMAL HIGH (ref 70–99)
Potassium: 3.3 mmol/L — ABNORMAL LOW (ref 3.5–5.1)
Sodium: 137 mmol/L (ref 135–145)
Total Bilirubin: 0.9 mg/dL (ref 0.0–1.2)
Total Protein: 4.8 g/dL — ABNORMAL LOW (ref 6.5–8.1)

## 2024-05-06 LAB — GLUCOSE, CAPILLARY
Glucose-Capillary: 169 mg/dL — ABNORMAL HIGH (ref 70–99)
Glucose-Capillary: 182 mg/dL — ABNORMAL HIGH (ref 70–99)
Glucose-Capillary: 186 mg/dL — ABNORMAL HIGH (ref 70–99)
Glucose-Capillary: 212 mg/dL — ABNORMAL HIGH (ref 70–99)
Glucose-Capillary: 87 mg/dL (ref 70–99)

## 2024-05-06 LAB — CULTURE, BLOOD (SINGLE): Culture: NO GROWTH

## 2024-05-06 LAB — HEPARIN LEVEL (UNFRACTIONATED): Heparin Unfractionated: 0.75 [IU]/mL — ABNORMAL HIGH (ref 0.30–0.70)

## 2024-05-06 LAB — APTT: aPTT: 62 s — ABNORMAL HIGH (ref 24–36)

## 2024-05-06 MED ORDER — SODIUM CHLORIDE 0.9 % IV SOLN: INTRAVENOUS | Status: AC

## 2024-05-06 MED ORDER — APIXABAN 5 MG PO TABS
5.0000 mg | ORAL_TABLET | Freq: Two times a day (BID) | ORAL | Status: DC
  Administered 2024-05-06 – 2024-05-10 (×8): 5 mg via ORAL
  Filled 2024-05-06 (×8): qty 1

## 2024-05-06 MED ORDER — POTASSIUM CHLORIDE 10 MEQ/100ML IV SOLN
10.0000 meq | INTRAVENOUS | Status: AC
  Administered 2024-05-06 (×4): 10 meq via INTRAVENOUS
  Filled 2024-05-06 (×4): qty 100

## 2024-05-06 MED ORDER — ENSURE PLUS HIGH PROTEIN PO LIQD
237.0000 mL | Freq: Two times a day (BID) | ORAL | Status: DC
  Administered 2024-05-07 – 2024-05-10 (×7): 237 mL via ORAL
  Filled 2024-05-06 (×2): qty 237

## 2024-05-06 NOTE — Progress Notes (Signed)
 The rate on 1400 dose of zosyn  was increased at this time to 16ml/ hour per discussion with pharm K.  Hammons, in order to accommodate new orders for NS and 4 runs of KCL without risking compromise to PIVs

## 2024-05-06 NOTE — Progress Notes (Addendum)
 Pharmacy Consult for Heparin   Indication: atrial fibrillation   Allergies[1]  Patient Measurements: Height: 6' (182.9 cm) Weight: 87.9 kg (193 lb 12.6 oz) IBW/kg (Calculated) : 77.6 HEPARIN  DW (KG): 87.9  Vital Signs: Temp: 98.4 F (36.9 C) (01/25 0726) Temp Source: Oral (01/25 0726) BP: 115/70 (01/25 0726) Pulse Rate: 70 (01/25 0726)  Labs: Recent Labs    05/04/24 0618 05/04/24 9041 05/04/24 1523 05/05/24 0233 05/05/24 1445 05/06/24 0228  HGB 11.1*  --   --  10.2*  --  10.1*  HCT 32.5*  --   --  30.6*  --  30.9*  PLT 201  --   --  182  --  169  APTT 99*  --   --  65* 75* 62*  LABPROT  --   --  18.2*  --   --   --   INR  --   --  1.4*  --   --   --   HEPARINUNFRC  --   --   --  >1.10*  --  0.75*  CREATININE  --  0.95  --  0.86  --   --     Estimated Creatinine Clearance: 79 mL/min (by C-G formula based on SCr of 0.86 mg/dL).  Assessment: John [name] a 79 y.o. male presented with sepsis, on apixaban  for atrial fibrillation. LD 1/22 1144.  Pharmacy has been consulted for transition from apixaban >>heparin  dosing in anticipation of OR for IAI. Hgb 11.6, PLT 209.   01/25 AM: aPTT = 62 this AM which is subtherapeutic.  Hb 10.1, plt 169 (Stable). No bleeding or other issues reported by RN. Per Dr. Jonel will transition patient to Eliquis  at 10 PM tonight.  Goal of Therapy:  aPTT 66-102  Heparin  level 0.3-0.7 units/ml Monitor platelets by anticoagulation protocol: Yes   Plan:  STOP heparin  infusion at 10 PM tonight RESTART apixaban  5 mg BID one hour before turning off heparin  Plan communicated to primary RN for overnight team  R. Samual Satterfield, PharmD PGY-1 Acute Care Pharmacy Resident [facility] Please refer to AMION for [facility] Pharmacy numbers 05/06/2024 2:06 PM           [1]  Allergies Allergen Reactions   Ramipril Anaphylaxis   Chlorhexidine     Adhesive [Tape] Rash   Other Diarrhea    Severe intolerance to Chemotherapy in the past.    Sertraline Other (See Comments)    Extreme headaches

## 2024-05-06 NOTE — Progress Notes (Signed)
" °  Progress Note   Patient: John [name] FMW:[id number] DOB: 18-Dec-1945 DOA: 05/01/2024     5 DOS: the patient was seen and examined on 05/06/2024 at 10:41AM      Brief hospital course: 79 y.o. M with sCHF EF 35-40%, CAD s/p PCI Apr 2025, DM, recurrent UTI and indwelling foley, streptococcal bacteremia last Sep, Atrial fibrillation and SSS and heart block s/p PPM, stroke with residual left weakness, history breast cancer, hx DVT, and dementia lives at home who presented with weakness and poor PO intake.  In the ER, noted to have gross hematuria and purulent urine in bag, hypotension and rapid Afib so admitted to ICU on pressors and antibiotics.     Assessment and Plan: Septic shock, CAUTI and acute cholecystitis Presented with tachycardia, leukocytosis, encephalopathy, lactic acid 5.4, renal failure, and hypotension requiring pressors.  Blood cultures negative, urine culture growing VRE, CT imaging, follow-up HIDA scan confirmed cholecystitis  General Surgery consult, percutaneous cholecystostomy tube placed 1/23 Clinically improving, white count improving - Continue Zosyn , day 4 of 7 - Follow-up with IR for cholangiogram post discharge in 4 weeks - Follow-up with general surgery in 6 weeks - Follow-up with cardiology within 4 weeks for preoperative risk assessment prior to surgical gallbladder removal    Acute metabolic encephalopathy Dementia Unchanged - Delirium precautions - Continue escitalopram , mirtazapine , donepezil   Coronary artery disease Cerebrovascular disease Hypertension Chronic systolic congestive heart failure Blood pressure normal, hemodynamically normal, no fluid overload - Hold amlodipine  - Continue aspirin , Lipitor  - Resume Eliquis    Diabetes Glucose normal - Hold Lantus  - Continue sliding scale corrections  Chronic indwelling Foley Foley changed 2 days prior to admission at facility  Chronic atrial fibrillation Sick sinus syndrome History  of pacemaker See above - Resume Eliquis   Hyponatremia Improved to normal with fluids  Hypokalemia Supplemented and resolved  AKI Cr 1.4 on admission, improved to 0.9 today.  Anemia Hgb stable, no bleeding        Subjective: Stable, thirsty.  No focal pain.  WBC improving.     Physical Exam: BP 131/61 (BP Location: Left Arm)   Pulse 75   Temp 98.5 F (36.9 C) (Axillary)   Resp (!) 22   Ht 6' (1.829 m)   Wt 87.9 kg   SpO2 94%   BMI 26.28 kg/m   Elderly adult male, remains sluggish and weak Rate controlled, irregular, no murmurs, no peripheral edema Respiratory normal, lungs clear, no rales or wheezes Abdomen soft, less grimace to palpation in the right upper quadrant, no guarding Attention diminished, affect blunted, oriented to self, psychomotor slowing, speech slurred but following commands, still weak in the left side   Data Reviewed: CMP shows normal Tbili, low albumin, normal Cr Hypokalemia, will supplement  CBC shows improving WBC Stable anemia   Family Communication: Wife by phone    Disposition: Status is: Inpatient         Author: Lonni SHAUNNA Dalton, MD 05/06/2024 2:48 PM  For on call review [email or web address]    "

## 2024-05-06 NOTE — Progress Notes (Addendum)
 Pt is alert, only oriented to self. He is afebrile, stable hemodynamically, NSR on the monitor, on room air, normal respiratory effort, no acute distress noted overnight.   He was grunting crying when RN assessed him, but unable to tell that he is having abdominal pain. Pt's pain is determined by PAINAD, score 6-8. Morphine  2 mg PRN q 4 hr was given. Pt has been able to rest and sleep well after nursing interventions.   Pt is able to tolerate full liquid very well. Pt requires fully assist for feeding. Per MD noted, Pt maybe able to advance diet as tolerated. We will discuss with team at am. However, Pt is unable to swallow pills. We recommend to crush pills with liquid or apple sauce.   RUQ cholecystostomy tube>>serosanguinous drainage 100 ml on night shift, flush with NSS 5 ML q 8 hrs per order.  Dry dressing changed per protocol. Tube site is unremarkable.   Continue rectal tube due to liquid diarrhea>> minimal greenish watery stool in the bag.   Chronic Foley catheter use >> continue to monitor urine output.  Pressure sore with moisture associated skin irritation with erythematous on his perineal and scrotum area.>> skin care was provided with cleaning, keeping the skin dry, Gurhart''s butt cream applied.   Plan of care is reviewed. Pt has been progressing. We will continue to monitor.   Wendi Dash, RN

## 2024-05-07 ENCOUNTER — Telehealth (HOSPITAL_COMMUNITY): Payer: Self-pay

## 2024-05-07 ENCOUNTER — Other Ambulatory Visit (HOSPITAL_COMMUNITY): Payer: Self-pay

## 2024-05-07 DIAGNOSIS — A409 Streptococcal sepsis, unspecified: Secondary | ICD-10-CM | POA: Diagnosis not present

## 2024-05-07 LAB — COMPREHENSIVE METABOLIC PANEL WITH GFR
ALT: 31 U/L (ref 0–44)
AST: 71 U/L — ABNORMAL HIGH (ref 15–41)
Albumin: 1.9 g/dL — ABNORMAL LOW (ref 3.5–5.0)
Alkaline Phosphatase: 498 U/L — ABNORMAL HIGH (ref 38–126)
Anion gap: 13 (ref 5–15)
BUN: 25 mg/dL — ABNORMAL HIGH (ref 8–23)
CO2: 20 mmol/L — ABNORMAL LOW (ref 22–32)
Calcium: 9 mg/dL (ref 8.9–10.3)
Chloride: 101 mmol/L (ref 98–111)
Creatinine, Ser: 0.86 mg/dL (ref 0.61–1.24)
GFR, Estimated: >60 mL/min
Glucose, Bld: 172 mg/dL — ABNORMAL HIGH (ref 70–99)
Potassium: 4.1 mmol/L (ref 3.5–5.1)
Sodium: 133 mmol/L — ABNORMAL LOW (ref 135–145)
Total Bilirubin: 0.9 mg/dL (ref 0.0–1.2)
Total Protein: 5.4 g/dL — ABNORMAL LOW (ref 6.5–8.1)

## 2024-05-07 LAB — GLUCOSE, CAPILLARY
Glucose-Capillary: 132 mg/dL — ABNORMAL HIGH (ref 70–99)
Glucose-Capillary: 111 mg/dL — ABNORMAL HIGH (ref 70–99)
Glucose-Capillary: 139 mg/dL — ABNORMAL HIGH (ref 70–99)
Glucose-Capillary: 140 mg/dL — ABNORMAL HIGH (ref 70–99)

## 2024-05-07 LAB — CBC
HCT: 34.7 % — ABNORMAL LOW (ref 39.0–52.0)
Hemoglobin: 11 g/dL — ABNORMAL LOW (ref 13.0–17.0)
MCH: 28.1 pg (ref 26.0–34.0)
MCHC: 31.7 g/dL (ref 30.0–36.0)
MCV: 88.7 fL (ref 80.0–100.0)
Platelets: 242 10*3/uL (ref 150–400)
RBC: 3.91 MIL/uL — ABNORMAL LOW (ref 4.22–5.81)
RDW: 15.7 % — ABNORMAL HIGH (ref 11.5–15.5)
WBC: 14.3 10*3/uL — ABNORMAL HIGH (ref 4.0–10.5)
nRBC: 0 % (ref 0.0–0.2)

## 2024-05-07 LAB — APTT: aPTT: 39 s — ABNORMAL HIGH (ref 24–36)

## 2024-05-07 LAB — HEPARIN LEVEL (UNFRACTIONATED): Heparin Unfractionated: >1.1 [IU]/mL — ABNORMAL HIGH (ref 0.30–0.70)

## 2024-05-07 NOTE — Evaluation (Signed)
 Occupational Therapy Evaluation and Discharge Summary Patient Details Name: John [name] MRN: [id number] DOB: [birth year] Today's Date: 05/07/2024   History of Present Illness   Pt is a yo male admitted with hematuria and hypotension with afib from long term care facility. Pt with acute cholecystitis and had cholecystostomy on 05/04/24.  PMH: multiple hospitalizations over last year. Pt with CAD, CHG, DM, UTI with indwelling catheter, afib, stroke with R side weakness, breast CA, DVT, dementia.     Clinical Impressions Pt admitted with the above diagnosis and has the deficits outlined below. Pt came from [facility] facility and was dependent with all adls on arrival and remains dependent with all adls and functional mobility. Pt has been getting OOB with a hoyer lift only and staff assists pt with all his adls. Do not feel this pt has skilled therapy needs at this time and is at his baseline.  No further acute skilled OT needed.      If plan is discharge home, recommend the following:   Two people to help with walking and/or transfers;Two people to help with bathing/dressing/bathroom;Assistance with cooking/housework;Assistance with feeding;Direct supervision/assist for medications management;Assist for transportation;Direct supervision/assist for financial management;Help with stairs or ramp for entrance;Supervision due to cognitive status     Functional Status Assessment   Patient has not had a recent decline in their functional status     Equipment Recommendations   None recommended by OT     Recommendations for Other Services         Precautions/Restrictions   Precautions Precautions: Fall;Other (comment) Recall of Precautions/Restrictions: Impaired Precaution/Restrictions Comments: rectal tub, cholecystostomy tube, catheter, IV Restrictions Weight Bearing Restrictions Per Provider Order: No     Mobility Bed Mobility Overal bed mobility: Needs  Assistance Bed Mobility: Supine to Sit, Sit to Supine     Supine to sit: Total assist, +2 for physical assistance Sit to supine: Total assist, +2 for physical assistance   General bed mobility comments: Did helicopter style transfer to get pt to EOB due to rectal tube. Pt has been dependent in all mobility at LTC facility and remaints total assist to get to side of bed and back in bed. Pt with L leg pain when sitting EOB asking to lay back down.    Transfers Overall transfer level: Needs assistance                 General transfer comment: Pt could not tolerate transfers on this date. Pt dependent to get to EOB and to sit on EOB. Pt c/o pain in sitting position down L leg.  Pt will need hoyer lift to transfer and this is how he has been transferring at facility. Transfer via Lift Equipment: Maximove    Balance Overall balance assessment: History of Falls, Needs assistance Sitting-balance support: Feet supported Sitting balance-Leahy Scale: Zero Sitting balance - Comments: total assist. Postural control: Posterior lean   Standing balance-Leahy Scale: Zero Standing balance comment: unable to stand. Pt stated he has not stood in a long time                           ADL either performed or assessed with clinical judgement   ADL Overall ADL's : Needs assistance/impaired Eating/Feeding: Maximal assistance;Bed level   Grooming: Maximal assistance;Bed level   Upper Body Bathing: Total assistance;Bed level   Lower Body Bathing: Total assistance;Bed level   Upper Body Dressing : Total assistance;Bed level   Lower  Body Dressing: Total assistance;Bed level   Toilet Transfer: Total assistance   Toileting- Clothing Manipulation and Hygiene: Total assistance;+2 for physical assistance       Functional mobility during ADLs: Total assistance;+2 for physical assistance General ADL Comments: Pt is total assist for most adls. Pt can assist some with feeding and simple  grooming only.     Vision Baseline Vision/History: 0 No visual deficits Patient Visual Report: No change from baseline Vision Assessment?:  (unsure and needs further evaluation)     Perception Perception: Impaired Preception Impairment Details: Inattention/Neglect Perception-Other Comments: l neglect   Praxis Praxis: Not tested       Pertinent Vitals/Pain Pain Assessment Pain Assessment: Faces Faces Pain Scale: Hurts little more Pain Location: L leg Pain Descriptors / Indicators: Grimacing, Moaning Pain Intervention(s): Limited activity within patient's tolerance, Monitored during session, Repositioned     Extremity/Trunk Assessment Upper Extremity Assessment Upper Extremity Assessment: LUE deficits/detail LUE Deficits / Details: PROM WFL although mildly painful.  Strength: shoulder 2/5, biceps 3+/5, triceps 3+/5, grip 4/5 LUE: Shoulder pain with ROM LUE Coordination: decreased fine motor;decreased gross motor   Lower Extremity Assessment Lower Extremity Assessment: Defer to PT evaluation   Cervical / Trunk Assessment Cervical / Trunk Assessment: Normal   Communication Communication Communication: Impaired Factors Affecting Communication: Hearing impaired   Cognition Arousal: Lethargic Behavior During Therapy: Flat affect Cognition: History of cognitive impairments, Cognition impaired   Orientation impairments: Time, Situation Awareness: Intellectual awareness impaired, Online awareness impaired Memory impairment (select all impairments): Short-term memory, Declarative long-term memory, Working memory Attention impairment (select first level of impairment): Focused attention Executive functioning impairment (select all impairments): Initiation, Organization, Sequencing, Reasoning, Problem solving                   Following commands: Impaired Following commands impaired: Only follows one step commands consistently     Cueing  General Comments   Cueing  Techniques: Verbal cues;Tactile cues  Pt was dependent on arrival and remains dependent with all adls and mobility.   Exercises     Shoulder Instructions      Home Living Family/patient expects to be discharged to:: Skilled nursing facility                                 Additional Comments: multiple d/cs to and from SNF      Prior Functioning/Environment Prior Level of Function : Needs assist  Cognitive Assist : ADLs (cognitive);Mobility (cognitive) Mobility (Cognitive): Intermittent cues ADLs (Cognitive): Intermittent cues Physical Assist : ADLs (physical);Mobility (physical) Mobility (physical): Gait;Stairs;Transfers ADLs (physical): Feeding;Grooming;Bathing;Dressing;Toileting;IADLs Mobility Comments: pt has been dependent in mobility at long term care facility. Bed to reclinder via hoyer lift only ADLs Comments: dependent in all adls at long term care facility    OT Problem List: Decreased strength;Decreased range of motion;Decreased activity tolerance;Impaired balance (sitting and/or standing);Impaired vision/perception;Decreased coordination;Decreased cognition;Decreased safety awareness;Decreased knowledge of use of DME or AE;Decreased knowledge of precautions;Impaired tone;Impaired UE functional use;Pain   OT Treatment/Interventions:        OT Goals(Current goals can be found in the care plan section)   Acute Rehab OT Goals Patient Stated Goal: none stated OT Goal Formulation: All assessment and education complete, DC therapy   OT Frequency:       Co-evaluation PT/OT/SLP Co-Evaluation/Treatment: Yes Reason for Co-Treatment: Complexity of the patient's impairments (multi-system involvement) PT goals addressed during session: Mobility/safety with mobility OT goals addressed during  session: ADL's and self-care      AM-PAC OT 6 Clicks Daily Activity     Outcome Measure Help from another person eating meals?: A Lot Help from another person  taking care of personal grooming?: A Lot Help from another person toileting, which includes using toliet, bedpan, or urinal?: Total Help from another person bathing (including washing, rinsing, drying)?: Total Help from another person to put on and taking off regular upper body clothing?: Total Help from another person to put on and taking off regular lower body clothing?: Total 6 Click Score: 8   End of Session Nurse Communication: Mobility status  Activity Tolerance: Patient limited by lethargy;Patient limited by fatigue Patient left: in bed;with call bell/phone within reach;with bed alarm set  OT Visit Diagnosis: Muscle weakness (generalized) (M62.81)                Time: 9082-9061 OT Time Calculation (min): 21 min Charges:  OT General Charges $OT Visit: 1 Visit OT Evaluation $OT Eval Low Complexity: 1 Low  Joshua Silvano Dragon 05/07/2024, 10:00 AM

## 2024-05-07 NOTE — Telephone Encounter (Signed)
 Pharmacy Patient Advocate Encounter  Insurance verification completed.    The patient is insured through [employer]. Patient has Medicare and is not eligible for a copay card, but may be able to apply for patient assistance or Medicare RX Payment Plan (Patient Must reach out to their plan, if eligible for payment plan), if available.    Ran test claim for Eliquis 5mg  tablet and the current 30 day co-pay is $249 due to deductible.   This test claim was processed through [facility]- copay amounts may vary at other pharmacies due to pharmacy/plan contracts, or as the patient moves through the different stages of their insurance plan.

## 2024-05-07 NOTE — Progress Notes (Signed)
" °  Progress Note   Patient: Graysin Luczynski FMW:[id number] DOB: 21-Jan-1946 DOA: 05/01/2024     6 DOS: the patient was seen and examined on 05/07/2024 at 8:55AM      Brief hospital course: 79 y.o. M with sCHF EF 35-40%, CAD s/p PCI Apr 2025, DM, recurrent UTI and indwelling foley, streptococcal bacteremia last Sep, Atrial fibrillation and SSS and heart block s/p PPM, stroke with residual left weakness, history breast cancer, hx DVT, and dementia lives at home who presented with weakness and poor PO intake.  In the ER, noted to have gross hematuria and purulent urine in bag, hypotension and rapid Afib so admitted to ICU on pressors and antibiotics.     Assessment and Plan: Septic shock, CAUTI and acute cholecystitis See summary from 1/25 - Continue Zosyn , day 5 of 7    Acute metabolic encephalopathy Dementia No change, very HOH - Continue escitalopram , mirtazapine , donepezil   Coronary artery disease Cerebrovascular disease Hypertension Chronic systolic congestive heart failure - Continue ELiquis , aspirin , Liptior  - Hold amlodipine   Diabetes Glucose mostly normal - Hold Lantus  - Continue sliding scale corrections  Chronic indwelling Foley Foley changed 2 days prior to admission at facility  Chronic atrial fibrillation Sick sinus syndrome History of pacemaker See above - Continue apixaban   Hyponatremia Improved to normal with fluids  Hypokalemia Supplemented and resolved  AKI Cr 1.4 on admission, improved to 0.9 .  Anemia Hgb stable, no bleeding        Subjective: No clinical change, no nursing concerns.  Chole tube with bloody drainage     Physical Exam: BP (!) 124/54   Pulse 78   Temp 97.7 F (36.5 C) (Axillary)   Resp 20   Ht 6' (1.829 m)   Wt 88.7 kg   SpO2 93%   BMI 26.52 kg/m   Elderly adult male, remains sluggish and weak Rate controlled, irregular, no murmurs, no peripheral edema Respiratory normal, lungs clear, no rales or  wheezes Abdomen soft, less grimace to palpation in the right upper quadrant, no guarding Attention diminished, affect blunted, oriented to self, psychomotor slowing, speech slurred but following commands, still weak in the left side   Data Reviewed: Mild hyponatermia, K normal CBC with leukocytosis, stable  Stable anemia   Family Communication:     Disposition: Status is: Inpatient         Author: Lonni SHAUNNA Dalton, MD 05/07/2024 4:19 PM  For on call review [email or web address].    "

## 2024-05-07 NOTE — Evaluation (Signed)
 Physical Therapy Evaluation Patient Details Name: John [name] MRN: [id number] DOB: 05-17-1945 Today's Date: 05/07/2024  History of Present Illness  Pt is a yo male admitted with hematuria and hypotension with afib from long term care facility. Pt with acute cholecystitis and had cholecystostomy on 05/04/24.  PMH: multiple hospitalizations over last year. Pt with CAD, CHG, DM, UTI with indwelling catheter, afib, stroke with L side weakness, breast CA, DVT, dementia.   Clinical Impression  Pt admitted with above diagnosis. PTA pt resided at [facility]. He was total assist, dependent hoyer lift transfers bed to/from recliner. Pt currently with functional limitations due to the deficits listed below (see PT Problem List). On eval, pt required +2 total assist supine to sit. Total assist to maintain sitting balance EOB, zero balance with heavy posterior lean. Tolerated sitting 3-4 minutes before requesting to return to supine. Pt will benefit from acute skilled PT to increase their independence and safety with mobility to allow discharge. PT to follow acutely to address activity tolerance and establish routine of OOB to recliner with maximove. Post acute, recommend return to LTC.           If plan is discharge home, recommend the following: Two people to help with walking and/or transfers;Two people to help with bathing/dressing/bathroom   Can travel by private vehicle   No    Equipment Recommendations None recommended by PT  Recommendations for Other Services       Functional Status Assessment Patient has had a recent decline in their functional status and/or demonstrates limited ability to make significant improvements in function in a reasonable and predictable amount of time     Precautions / Restrictions Precautions Precautions: Fall;Other (comment) Recall of Precautions/Restrictions: Impaired Precaution/Restrictions Comments: rectal tub, cholecystostomy tube, catheter, IV       Mobility  Bed Mobility Overal bed mobility: Needs Assistance Bed Mobility: Supine to Sit, Sit to Supine     Supine to sit: Total assist, +2 for physical assistance Sit to supine: Total assist, +2 for physical assistance   General bed mobility comments: helicopter method using bed pad to transition to/from EOB    Transfers                   General transfer comment: dependent hoyer transfers at baseline    Ambulation/Gait               General Gait Details: nonamb at baseline  Stairs            Wheelchair Mobility     Tilt Bed    Modified Rankin (Stroke Patients Only)       Balance Overall balance assessment: History of Falls, Needs assistance Sitting-balance support: Feet supported Sitting balance-Leahy Scale: Zero Sitting balance - Comments: total assist. Pt EOB 3-4 minutes before requesting return to supine due to fatigue. Postural control: Posterior lean                                   Pertinent Vitals/Pain Pain Assessment Pain Assessment: Faces Faces Pain Scale: Hurts little more Pain Location: LLE Pain Descriptors / Indicators: Grimacing, Moaning, Guarding Pain Intervention(s): Limited activity within patient's tolerance, Monitored during session, Repositioned    Home Living Family/patient expects to be discharged to:: Skilled nursing facility                   Additional Comments: multiple d/cs to and  from SNF    Prior Function Prior Level of Function : Needs assist  Cognitive Assist : ADLs (cognitive);Mobility (cognitive) Mobility (Cognitive): Intermittent cues ADLs (Cognitive): Intermittent cues Physical Assist : ADLs (physical);Mobility (physical) Mobility (physical): Gait;Stairs;Transfers ADLs (physical): Feeding;Grooming;Bathing;Dressing;Toileting;IADLs Mobility Comments: pt has been dependent in mobility at long term care facility. Bed to reclinder via hoyer lift only ADLs Comments: dependent in  all adls at long term care facility     Extremity/Trunk Assessment   Upper Extremity Assessment Upper Extremity Assessment: Defer to OT evaluation LUE Deficits / Details: PROM WFL although mildly painful.  Strength: shoulder 2/5, biceps 3+/5, triceps 3+/5, grip 4/5 LUE: Shoulder pain with ROM LUE Coordination: decreased fine motor;decreased gross motor    Lower Extremity Assessment Lower Extremity Assessment: Generalized weakness;LLE deficits/detail LLE Deficits / Details: residual weakness from previous CVA    Cervical / Trunk Assessment Cervical / Trunk Assessment: Kyphotic  Communication   Communication Communication: Impaired Factors Affecting Communication: Hearing impaired    Cognition Arousal: Lethargic Behavior During Therapy: Flat affect   PT - Cognitive impairments: Orientation, Awareness, Memory, Attention, Initiation, Sequencing, Problem solving, Safety/Judgement                       PT - Cognition Comments: Oriented to self. Able to state the month as Jan but stated the year as 45. Following commands: Impaired Following commands impaired: Only follows one step commands consistently, Follows one step commands with increased time     Cueing Cueing Techniques: Verbal cues, Tactile cues     General Comments General comments (skin integrity, edema, etc.): Pt was dependent on arrival and remains dependent with all adls and mobility.    Exercises     Assessment/Plan    PT Assessment Patient needs continued PT services  PT Problem List Decreased strength;Decreased mobility;Decreased balance;Pain;Decreased activity tolerance       PT Treatment Interventions Therapeutic exercise;Functional mobility training;Therapeutic activities;Patient/family education;Cognitive remediation    PT Goals (Current goals can be found in the Care Plan section)  Acute Rehab PT Goals Patient Stated Goal: Pt reports he enjoys sitting in the recliner. PT Goal Formulation:  With patient Time For Goal Achievement: 05/21/24 Potential to Achieve Goals: Fair    Frequency Min 1X/week     Co-evaluation PT/OT/SLP Co-Evaluation/Treatment: Yes Reason for Co-Treatment: Complexity of the patient's impairments (multi-system involvement) PT goals addressed during session: Mobility/safety with mobility;Balance OT goals addressed during session: ADL's and self-care       AM-PAC PT 6 Clicks Mobility  Outcome Measure Help needed turning from your back to your side while in a flat bed without using bedrails?: Total Help needed moving from lying on your back to sitting on the side of a flat bed without using bedrails?: Total Help needed moving to and from a bed to a chair (including a wheelchair)?: Total Help needed standing up from a chair using your arms (e.g., wheelchair or bedside chair)?: Total Help needed to walk in hospital room?: Total Help needed climbing 3-5 steps with a railing? : Total 6 Click Score: 6    End of Session   Activity Tolerance: Patient limited by fatigue;Patient limited by lethargy Patient left: in bed;with call bell/phone within reach;with bed alarm set Nurse Communication: Need for lift equipment;Mobility status PT Visit Diagnosis: Other abnormalities of gait and mobility (R26.89);Muscle weakness (generalized) (M62.81)    Time: 9082-9062 PT Time Calculation (min) (ACUTE ONLY): 20 min   Charges:   PT Evaluation $PT Eval Moderate Complexity:  1 Mod   PT General Charges $$ ACUTE PT VISIT: 1 Visit         Sari MATSU., PT  Office # [phone]   Erven Sari Shaker 05/07/2024, 10:48 AM

## 2024-05-07 NOTE — Progress Notes (Signed)
 During my shift pt had very poor appetite and did not eat anything from food tray.  Pt did have a few sips on Vanilla Ensure. Pt requested ice water to drink, total water intake ~ 240 mLs.

## 2024-05-07 NOTE — Progress Notes (Signed)
 Pt is alert, only oriented to self. He is afebrile, stable hemodynamically, NSR on the monitor, on room air, normal respiratory effort, no acute distress noted overnight.    He was crying and able to tell that he was having abdominal pain. PAINAD, score 7-8. Morphine  2 mg PRN q 4 hr was given. Pt has been able to rest and sleep well.   Pt has low appetite today, he ate only a cup of jello per RN day shift reported. Oral intake was encouraged. We recommend to crush pills and serve with liquid or juice for him.    RUQ cholecystostomy tube>>serosanguinous drainage 85+80 ml in 24 hours both shifts, flushed with NSS 5 ML q 8 hrs per order.  Dry dressing changed per protocol. Tube site is unremarkable.    Pt has had diarrhea with minimal greenish watery stool via rectal tube.   Chronic Foley catheter use, Pt had large amount of urine leaked out from urethra outlet and no output to the bag. Foley cath clotting from sediment was suspected. We flushed with 20 ml and got very cloudy urine output. No record of urine output due to unmeasurable leaking on previous shift. We will monitor closely.   Moisture associated skin irritation with erythematous on his perineal and scrotum area.>> skin care was provided with cleaning, keeping the skin dry, Gurhart''s butt cream applied.    Plan of care is reviewed. Pt has been progressing. We will continue to monitor.    Wendi Dash, RN

## 2024-05-07 NOTE — TOC Progression Note (Signed)
 Transition of Care [facility]) - Progression Note    Patient Details  Name: John [name] MRN: [id number] Date of Birth: 1945/11/15  [facility] CM/SW Contact  Luise JAYSON Pan, [place] Phone Number: 05/07/2024, 8:55 AM  Clinical Narrative:   CSW left VM for [facility] and Rehab admissions director to inquire about when/if facility will be operational and when patient could discharge back to his ltc bed.  CSW will continue to follow.    Expected Discharge Plan: Skilled Nursing Facility Barriers to Discharge: Continued Medical Work up               Expected Discharge Plan and Services In-house Referral: Clinical Social Work     Living arrangements for the past 2 months: Skilled Nursing Facility                                       Social Drivers of Health (SDOH) Interventions SDOH Screenings   Food Insecurity: Patient Unable To Answer (05/01/2024)  Housing: Unknown (05/01/2024)  Transportation Needs: Patient Unable To Answer (05/01/2024)  Utilities: Patient Unable To Answer (05/01/2024)  Alcohol Screen: Low Risk (01/27/2023)  Depression (PHQ2-9): Low Risk (01/28/2023)  Financial Resource Strain: Low Risk (10/03/2023)  Physical Activity: Inactive (10/03/2023)  Social Connections: Patient Unable To Answer (05/01/2024)  Stress: No Stress Concern Present (10/03/2023)  Tobacco Use: Low Risk (05/04/2024)    Readmission Risk Interventions    12/06/2023    4:03 PM 05/11/2023   12:52 PM 04/22/2023   12:12 PM  Readmission Risk Prevention Plan  Medication Screening   Complete  Transportation Screening Complete Complete Complete  PCP or Specialist Appt within 3-5 Days  Complete   HRI or Home Care Consult  Complete   Social Work Consult for Recovery Care Planning/Counseling  Complete   Palliative Care Screening  Not Applicable   Medication Review Oceanographer) Complete Complete   HRI or Home Care Consult Complete    SW Recovery Care/Counseling Consult  Complete    Palliative Care Screening Not Applicable    Skilled Nursing Facility Not Applicable

## 2024-05-08 LAB — COMPREHENSIVE METABOLIC PANEL WITH GFR
ALT: 20 U/L (ref 0–44)
AST: 35 U/L (ref 15–41)
Albumin: 2 g/dL — ABNORMAL LOW (ref 3.5–5.0)
Alkaline Phosphatase: 398 U/L — ABNORMAL HIGH (ref 38–126)
Anion gap: 9 (ref 5–15)
BUN: 19 mg/dL (ref 8–23)
CO2: 24 mmol/L (ref 22–32)
Calcium: 8.8 mg/dL — ABNORMAL LOW (ref 8.9–10.3)
Chloride: 101 mmol/L (ref 98–111)
Creatinine, Ser: 0.69 mg/dL (ref 0.61–1.24)
GFR, Estimated: >60 mL/min
Glucose, Bld: 127 mg/dL — ABNORMAL HIGH (ref 70–99)
Potassium: 3.3 mmol/L — ABNORMAL LOW (ref 3.5–5.1)
Sodium: 134 mmol/L — ABNORMAL LOW (ref 135–145)
Total Bilirubin: 0.9 mg/dL (ref 0.0–1.2)
Total Protein: 5 g/dL — ABNORMAL LOW (ref 6.5–8.1)

## 2024-05-08 LAB — CBC
HCT: 31.2 % — ABNORMAL LOW (ref 39.0–52.0)
Hemoglobin: 10.4 g/dL — ABNORMAL LOW (ref 13.0–17.0)
MCH: 27.7 pg (ref 26.0–34.0)
MCHC: 33.3 g/dL (ref 30.0–36.0)
MCV: 83.2 fL (ref 80.0–100.0)
Platelets: 251 10*3/uL (ref 150–400)
RBC: 3.75 MIL/uL — ABNORMAL LOW (ref 4.22–5.81)
RDW: 15.4 % (ref 11.5–15.5)
WBC: 15 10*3/uL — ABNORMAL HIGH (ref 4.0–10.5)
nRBC: 0 % (ref 0.0–0.2)

## 2024-05-08 LAB — GLUCOSE, CAPILLARY
Glucose-Capillary: 121 mg/dL — ABNORMAL HIGH (ref 70–99)
Glucose-Capillary: 127 mg/dL — ABNORMAL HIGH (ref 70–99)
Glucose-Capillary: 145 mg/dL — ABNORMAL HIGH (ref 70–99)
Glucose-Capillary: 168 mg/dL — ABNORMAL HIGH (ref 70–99)
Glucose-Capillary: 204 mg/dL — ABNORMAL HIGH (ref 70–99)
Glucose-Capillary: 204 mg/dL — ABNORMAL HIGH (ref 70–99)
Glucose-Capillary: 99 mg/dL (ref 70–99)

## 2024-05-08 MED ORDER — POTASSIUM CHLORIDE CRYS ER 20 MEQ PO TBCR
20.0000 meq | EXTENDED_RELEASE_TABLET | Freq: Once | ORAL | Status: AC
  Administered 2024-05-08: 20 meq via ORAL
  Filled 2024-05-08: qty 1

## 2024-05-08 MED ORDER — POTASSIUM CHLORIDE 2 MEQ/ML IV SOLN
INTRAVENOUS | Status: AC
  Filled 2024-05-08 (×2): qty 1000

## 2024-05-08 NOTE — Plan of Care (Signed)
  Problem: Nutritional: Goal: Maintenance of adequate nutrition will improve Outcome: Progressing   Problem: Skin Integrity: Goal: Risk for impaired skin integrity will decrease Outcome: Progressing   Problem: Activity: Goal: Risk for activity intolerance will decrease Outcome: Progressing   Problem: Nutrition: Goal: Adequate nutrition will be maintained Outcome: Progressing   

## 2024-05-08 NOTE — Progress Notes (Signed)
 Pt refuses pulse oximetry monitoring, pt also requested to eat salad around midnight and is tolerating the salad no signs of aspiration.
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
## 2024-05-08 NOTE — Plan of Care (Signed)
" °  Problem: Coping: Goal: Ability to adjust to condition or change in health will improve Outcome: Progressing   Problem: Health Behavior/Discharge Planning: Goal: Ability to identify and utilize available resources and services will improve Outcome: Progressing Goal: Ability to manage health-related needs will improve Outcome: Progressing   Problem: Metabolic: Goal: Ability to maintain appropriate glucose levels will improve Outcome: Progressing   Problem: Nutritional: Goal: Maintenance of adequate nutrition will improve Outcome: Progressing   Problem: Health Behavior/Discharge Planning: Goal: Ability to manage health-related needs will improve Outcome: Progressing   Problem: Elimination: Goal: Will not experience complications related to urinary retention Outcome: Progressing   "

## 2024-05-09 DIAGNOSIS — I4891 Unspecified atrial fibrillation: Secondary | ICD-10-CM

## 2024-05-09 DIAGNOSIS — Z515 Encounter for palliative care: Secondary | ICD-10-CM

## 2024-05-09 LAB — GLUCOSE, CAPILLARY
Glucose-Capillary: 174 mg/dL — ABNORMAL HIGH (ref 70–99)
Glucose-Capillary: 146 mg/dL — ABNORMAL HIGH (ref 70–99)
Glucose-Capillary: 169 mg/dL — ABNORMAL HIGH (ref 70–99)
Glucose-Capillary: 185 mg/dL — ABNORMAL HIGH (ref 70–99)
Glucose-Capillary: 186 mg/dL — ABNORMAL HIGH (ref 70–99)

## 2024-05-09 LAB — CBC
HCT: 31.5 % — ABNORMAL LOW (ref 39.0–52.0)
Hemoglobin: 10.7 g/dL — ABNORMAL LOW (ref 13.0–17.0)
MCH: 27.8 pg (ref 26.0–34.0)
MCHC: 34 g/dL (ref 30.0–36.0)
MCV: 81.8 fL (ref 80.0–100.0)
Platelets: 304 10*3/uL (ref 150–400)
RBC: 3.85 MIL/uL — ABNORMAL LOW (ref 4.22–5.81)
RDW: 15.7 % — ABNORMAL HIGH (ref 11.5–15.5)
WBC: 16.2 10*3/uL — ABNORMAL HIGH (ref 4.0–10.5)
nRBC: 0.1 % (ref 0.0–0.2)

## 2024-05-09 LAB — COMPREHENSIVE METABOLIC PANEL WITH GFR
ALT: 14 U/L (ref 0–44)
AST: 23 U/L (ref 15–41)
Albumin: 2.1 g/dL — ABNORMAL LOW (ref 3.5–5.0)
Alkaline Phosphatase: 299 U/L — ABNORMAL HIGH (ref 38–126)
Anion gap: 8 (ref 5–15)
BUN: 17 mg/dL (ref 8–23)
CO2: 25 mmol/L (ref 22–32)
Calcium: 8.9 mg/dL (ref 8.9–10.3)
Chloride: 103 mmol/L (ref 98–111)
Creatinine, Ser: 0.72 mg/dL (ref 0.61–1.24)
GFR, Estimated: >60 mL/min
Glucose, Bld: 177 mg/dL — ABNORMAL HIGH (ref 70–99)
Potassium: 3.4 mmol/L — ABNORMAL LOW (ref 3.5–5.1)
Sodium: 136 mmol/L (ref 135–145)
Total Bilirubin: 0.8 mg/dL (ref 0.0–1.2)
Total Protein: 5 g/dL — ABNORMAL LOW (ref 6.5–8.1)

## 2024-05-09 LAB — AEROBIC/ANAEROBIC CULTURE W GRAM STAIN (SURGICAL/DEEP WOUND)
Culture: NO GROWTH
Gram Stain: NONE SEEN

## 2024-05-09 MED ORDER — LOPERAMIDE HCL 2 MG PO CAPS
2.0000 mg | ORAL_CAPSULE | ORAL | Status: DC | PRN
  Administered 2024-05-09 – 2024-05-10 (×2): 2 mg via ORAL
  Filled 2024-05-09 (×3): qty 1

## 2024-05-09 MED ORDER — PIPERACILLIN-TAZOBACTAM 3.375 G IVPB
3.3750 g | Freq: Three times a day (TID) | INTRAVENOUS | Status: DC
  Administered 2024-05-09 – 2024-05-10 (×3): 3.375 g via INTRAVENOUS
  Filled 2024-05-09 (×6): qty 50

## 2024-05-09 MED ORDER — POTASSIUM CHLORIDE CRYS ER 20 MEQ PO TBCR
40.0000 meq | EXTENDED_RELEASE_TABLET | Freq: Once | ORAL | Status: AC
  Administered 2024-05-09: 40 meq via ORAL
  Filled 2024-05-09: qty 2

## 2024-05-09 NOTE — TOC Progression Note (Addendum)
 Transition of Care [facility]) - Progression Note    Patient Details  Name: John [name] MRN: [id number] Date of Birth: 11-Apr-1946  Transition [facility]) CM/SW Contact  Luise JAYSON Pan, [place] Phone Number: 05/09/2024, 10:48 AM  Clinical Narrative:   Per MD, patient to likely be medically stable tomorrow. CSW updated facility. CSW left VM for patients spouse.   10:49 AM CSW followed up with patients wife about patients anticipated DC. Ms. Huge inquired about patient going to another facility. CSW explained that patient would need to dc back to ltc facility and facility could follow up with facilitating a transfer. Per Ms. Cera, patient was not a long term care patient and patient was at facility for rehab. Ms. Randle stated that if patient were needing long term care then patient would need to discharge home as she does not have the finances to pay for LTC. Patient stated she is in the process of applying CSW called Darrien at [facility] to further clarify and Darrien stated patient was at facility for STR. CSW inquired with Darrien about how many days patient used for STR. Per Darrien, she will check and follow up with CSW.  11:50 AM Ms. Mazzocco asked CSW to look into other facilities ([address] [facility], [facility], [facility]). Three of the facilities are full at this time; [facility] sent referral for review. CSW discussed this with Ms.Bruschi and she would like patient to return to [facility] at this time.  12:08 PM Ms. Hetz called CSW to inform that she wants to appeal patients discharge if patient discharges tomorrow. CSW inquired about why Ms. Hage would like to appeal. Ms. Patti stated to buy time as she feels the patient is not ready to discharge. CSW advised Ms. Kolodziejski to speak with MD tomorrow prior to appealing to receives information on patients medical readiness. CSW also informed that if she still wants to appeal after conversation with MD it is the patients medicare right to  appeal. CSW informed that if insurance does not approve appeal then patient will end up responsible for his hospital stay from the time the appeal is initiated until the decision is rendered. Ms. Bachmeier inquired about how much would patient have to pay. CSW stated CSW does not know how much the hospital bill would be. CSW informed MD, bedside RN, and RNCM of above conversation.   CSW will continue to follow.    Expected Discharge Plan: Skilled Nursing Facility Barriers to Discharge: Continued Medical Work up               Expected Discharge Plan and Services In-house Referral: Clinical Social Work     Living arrangements for the past 2 months: Skilled Nursing Facility                                       Social Drivers of Health (SDOH) Interventions SDOH Screenings   Food Insecurity: Patient Unable To Answer (05/01/2024)  Housing: Unknown (05/01/2024)  Transportation Needs: Patient Unable To Answer (05/01/2024)  Utilities: Patient Unable To Answer (05/01/2024)  Alcohol Screen: Low Risk (01/27/2023)  Depression (PHQ2-9): Low Risk (01/28/2023)  Financial Resource Strain: Low Risk (10/03/2023)  Physical Activity: Inactive (10/03/2023)  Social Connections: Patient Unable To Answer (05/01/2024)  Stress: No Stress Concern Present (10/03/2023)  Tobacco Use: Low Risk (05/04/2024)    Readmission Risk Interventions    12/06/2023  4:03 PM 05/11/2023   12:52 PM 04/22/2023   12:12 PM  Readmission Risk Prevention Plan  Medication Screening   Complete  Transportation Screening Complete Complete Complete  PCP or Specialist Appt within 3-5 Days  Complete   HRI or Home Care Consult  Complete   Social Work Consult for Recovery Care Planning/Counseling  Complete   Palliative Care Screening  Not Applicable   Medication Review Oceanographer) Complete Complete   HRI or Home Care Consult Complete    SW Recovery Care/Counseling Consult Complete    Palliative Care Screening Not  Applicable    Skilled Nursing Facility Not Applicable

## 2024-05-09 NOTE — Progress Notes (Addendum)
 Spoke with patient's wife in detail.  Patient has multiple comorbid conditions and has gradually declined.  He has poor p.o. intake with failure to thrive.  At this time patient's wife has decided to take him home with hospice.  He will qualify for home with hospice as he has poor prognosis with life expectancy of less than 6 months under current conditions. Will consult TOC for home with hospice  Total time spent in review of records and discussion with patient's family is greater than 30 minutes

## 2024-05-09 NOTE — NC FL2 (Signed)
 " John [name]  MEDICAID FL2 LEVEL OF CARE FORM     IDENTIFICATION  Patient Name: John [name] Birthdate: 11-15-1945 Sex: male Admission Date (Current Location): 05/01/2024  [facility] and [place] Number:  Producer, Television/film/video and Address:  [facility], [address] [place], [place] [address]      Provider Number: [id number]  Attending Physician Name and Address:  Drusilla Sabas RAMAN, MD  Relative Name and Phone Number:  John [name], John [name], Emergency Contact [phone]    Current Level of Care: Hospital Recommended Level of Care: Skilled Nursing Facility Prior Approval Number:    Date Approved/Denied:   PASRR Number: [id number]  Discharge Plan: SNF    Current Diagnoses: Patient Active Problem List   Diagnosis Date Noted   CVA (cerebral vascular accident) (HCC) 02/03/2024   Prerenal azotemia 01/28/2024   Unresponsive episode 01/28/2024   Bacterial UTI 01/28/2024   Right pontine stroke (HCC) 01/24/2024   Acute cystitis without hematuria 01/17/2024   History of CVA (cerebrovascular accident) 01/17/2024   Weakness 01/17/2024   Left-sided weakness 01/16/2024   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) 01/03/2024   Atherosclerosis of coronary artery without angina pectoris 01/02/2024   Cognitive communication disorder 01/02/2024   Late effects of cerebrovascular disease 01/02/2024   Muscle weakness 01/02/2024   Status post coronary angioplasty 01/02/2024   Urinary tract obstruction 01/02/2024   Intractable nausea and vomiting 12/26/2023   Constipation 12/26/2023   AMS (altered mental status) 12/13/2023   Altered mental status 12/09/2023   Nausea & vomiting 12/09/2023   Status post placement of cardiac pacemaker 12/09/2023   Sepsis (HCC) 12/04/2023   CAD S/P percutaneous coronary angioplasty 12/04/2023   Microcytic hypochromic anemia 10/11/2023   HFrEF (heart failure with reduced ejection fraction) (HCC)  08/17/2023   DVT, femoral, chronic (HCC) 08/15/2023   Bladder outlet obstruction 08/15/2023   Sepsis due to Escherichia coli (HCC) 08/15/2023   Debility 08/13/2023   Non-ST elevation (NSTEMI) myocardial infarction (HCC) 08/10/2023   Cholelithiasis 08/07/2023   VTE (venous thromboembolism) 08/07/2023   Lactic acidosis 08/07/2023   Sacral pressure ulcer 08/07/2023   Streptococcal bacteremia 08/07/2023   B12 deficiency 08/07/2023   Iron deficiency anemia 08/07/2023   Severe sepsis (HCC) 08/06/2023   Chronic diastolic CHF (congestive heart failure) (HCC) 08/06/2023   Bacteriuria 08/06/2023   Acute encephalopathy 08/06/2023   Other pulmonary embolism without acute cor pulmonale (HCC) 06/16/2023   Essential hypertension 06/15/2023   Situational depression 06/15/2023   Protein-calorie malnutrition, severe 05/12/2023   Heart block AV complete (HCC) 05/12/2023   Syncope 05/10/2023   Fall at home, initial encounter 05/08/2023   Hypotension 05/08/2023   Abnormal LFTs 05/08/2023   Anemia 05/08/2023   Coping style affecting medical condition 05/02/2023   Acute renal failure superimposed on chronic kidney disease 04/28/2023   Brainstem infarct, acute (HCC) 04/25/2023   Pressure injury of skin 04/25/2023   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   Acute kidney injury superimposed on chronic kidney disease 04/17/2023   DKA (diabetic ketoacidosis) (HCC) 04/16/2023   Type 2 diabetes mellitus with hyperglycemia, with long-term current use of insulin  (HCC) 07/26/2022   Bronchitis 03/20/2021   Acute non-recurrent frontal sinusitis 03/20/2021   Viral URI with cough 01/13/2021   Hyperlipidemia 11/21/2020   Dizziness and giddiness 10/20/2020   Localized swelling of both lower extremities 10/20/2020   Tendinitis of right hip flexor 10/20/2020   Aortic atherosclerosis 09/10/2020   Insomnia 09/10/2020  Cognitive impairment 08/18/2020   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   Urinary  retention 06/04/2020   History of COVID-19 12/10/2019   Physical deconditioning 12/10/2019   Fatigue 12/10/2019   Unsteady gait 12/10/2019   Pneumonia due to COVID-19 virus 10/26/2019   Peripheral neuropathy 10/17/2019   Depression, major, single episode, complete remission 10/17/2019   History of breast cancer    GERD (gastroesophageal reflux disease)    BPH (benign prostatic hyperplasia)    CTS (carpal tunnel syndrome)    Diabetic polyneuropathy associated with type 2 diabetes mellitus (HCC) 09/29/2019   Neuropathic pain of foot 09/29/2019   Foley catheter in place 01/03/2019   Peripheral sensory neuropathy 05/17/2018   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   Hypomagnesemia 01/18/2018   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) 12/13/2017   Axillary adenopathy 12/09/2017   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   S/P cataract extraction and insertion of intraocular lens 01/17/2017   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   PVD (posterior vitreous detachment), left 01/07/2017   Hyperopia of both eyes with astigmatism 11/09/2016   Class 1 obesity due to excess calories without serious comorbidity with body mass index (BMI) of 31.0 to 31.9 in adult 09/17/2016   Encounter for monitoring tamoxifen  therapy 03/05/2016   Insulin  dependent type 2 diabetes mellitus (HCC) 07/02/2015   Diabetes 1.5, 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   Palpitation 09/16/2014    Orientation RESPIRATION BLADDER Height & Weight     Self  Normal Continent, Indwelling catheter Weight: 199 lb 4.7 oz (90.4 kg) Height:  6' (182.9 cm)  BEHAVIORAL SYMPTOMS/MOOD NEUROLOGICAL BOWEL NUTRITION STATUS      Incontinent Diet (Please see dc summary)  AMBULATORY STATUS COMMUNICATION OF NEEDS Skin   Extensive Assist Verbally PU Stage and Appropriate Care (Pressure Injury Sacrum Mid Stage 2; Irritant Contact Dermatitis Scrotum)                        Personal Care Assistance Level of Assistance  Bathing, Dressing, Feeding Bathing Assistance: Maximum assistance Feeding assistance: Maximum assistance Dressing Assistance: Maximum assistance     Functional Limitations Info  Sight, Hearing, Speech Sight Info: Impaired Hearing Info: Adequate Speech Info: Adequate    SPECIAL CARE FACTORS FREQUENCY  PT (By licensed PT), OT (By licensed OT)     PT Frequency: 5x week OT Frequency: 5x week            Contractures Contractures Info: Not present    Additional Factors Info  Code Status, Allergies, Isolation Precautions, Insulin  Sliding Scale, Psychotropic Code Status Info: DNR limited Allergies Info: Ramipril, Chlorhexidine , Adhesive (Tape), Other, Sertraline Psychotropic Info: Lexapro  Insulin  Sliding Scale Info: See dc summary Isolation Precautions Info: Contact pre     Current Medications (05/09/2024):  This is the current hospital active medication list Current Facility-Administered Medications  Medication Dose Route Frequency Provider Last Rate Last Admin   acetaminophen  (TYLENOL ) tablet 650 mg  650 mg Oral Q6H PRN Albustami, Omar M, MD   650 mg at 05/02/24 2128   apixaban  (ELIQUIS ) tablet 5 mg  5 mg Oral BID Jonel Lonni SQUIBB, MD   5 mg at 05/09/24 1033   atorvastatin  (LIPITOR ) tablet 80 mg  80 mg Oral QHS Alghanim, Fahid, MD   80 mg at 05/08/24 2038   donepezil  (ARICEPT ) tablet 5 mg  5 mg Oral QHS Alghanim, Fahid, MD   5 mg at 05/08/24 2041  escitalopram  (LEXAPRO ) tablet 10 mg  10 mg Oral Daily Alghanim, Fahid, MD   10 mg at 05/09/24 1033   feeding supplement (ENSURE PLUS HIGH PROTEIN) liquid 237 mL  237 mL Oral BID BM Jonel Lonni SQUIBB, MD   237 mL at 05/09/24 1033   Gerhardt's butt cream   Topical TID Dub Mancel HERO, MD   Given at 05/09/24 1033   insulin  aspart (novoLOG ) injection 0-15 Units  0-15 Units Subcutaneous Q4H Albustami, Omar M, MD   3 Units at 05/09/24 1305   mirtazapine  (REMERON ) tablet 7.5 mg   7.5 mg Oral QHS Alghanim, Fahid, MD   7.5 mg at 05/08/24 2038   morphine  (PF) 2 MG/ML injection 2 mg  2 mg Intravenous Q4H PRN Jonel Lonni SQUIBB, MD   2 mg at 05/07/24 1308   ondansetron  (ZOFRAN ) injection 4 mg  4 mg Intravenous Q6H PRN Albustami, Omar M, MD       Oral care mouth rinse  15 mL Mouth Rinse PRN Albustami, Mancel HERO, MD       pantoprazole  (PROTONIX ) EC tablet 40 mg  40 mg Oral Daily Danford, Lonni SQUIBB, MD   40 mg at 05/09/24 1033   polyethylene glycol (MIRALAX  / GLYCOLAX ) packet 17 g  17 g Oral Daily PRN Albustami, Omar M, MD       senna (SENOKOT) tablet 8.6 mg  1 tablet Oral BID PRN Dub Mancel HERO, MD       sodium chloride  flush (NS) 0.9 % injection 5 mL  5 mL Intracatheter Q8H Vanice Sharper, MD   5 mL at 05/09/24 9348     Discharge Medications: Please see discharge summary for a list of discharge medications.  Relevant Imaging Results:  Relevant Lab Results:   Additional Information SSN # [id number]  Luise JAYSON Pan, LCSWA     "

## 2024-05-09 NOTE — Plan of Care (Signed)
" °  Problem: Education: Goal: Ability to describe self-care measures that may prevent or decrease complications (Diabetes Survival Skills Education) will improve Outcome: Progressing Goal: Individualized Educational Video(s) Outcome: Progressing   Problem: Coping: Goal: Ability to adjust to condition or change in health will improve Outcome: Progressing   Problem: Fluid Volume: Goal: Ability to maintain a balanced intake and output will improve Outcome: Progressing   Problem: Health Behavior/Discharge Planning: Goal: Ability to identify and utilize available resources and services will improve Outcome: Progressing Goal: Ability to manage health-related needs will improve Outcome: Progressing   Problem: Metabolic: Goal: Ability to maintain appropriate glucose levels will improve Outcome: Progressing   Problem: Skin Integrity: Goal: Risk for impaired skin integrity will decrease Outcome: Progressing   Problem: Tissue Perfusion: Goal: Adequacy of tissue perfusion will improve Outcome: Progressing   Problem: Education: Goal: Knowledge of General Education information will improve Description: Including pain rating scale, medication(s)/side effects and non-pharmacologic comfort measures Outcome: Progressing   Problem: Clinical Measurements: Goal: Ability to maintain clinical measurements within normal limits will improve Outcome: Progressing Goal: Will remain free from infection Outcome: Progressing Goal: Respiratory complications will improve Outcome: Progressing Goal: Cardiovascular complication will be avoided Outcome: Progressing   Problem: Elimination: Goal: Will not experience complications related to bowel motility Outcome: Progressing Goal: Will not experience complications related to urinary retention Outcome: Progressing   Problem: Pain Managment: Goal: General experience of comfort will improve and/or be controlled Outcome: Progressing   Problem:  Safety: Goal: Ability to remain free from injury will improve Outcome: Progressing   Problem: Skin Integrity: Goal: Risk for impaired skin integrity will decrease Outcome: Progressing   Problem: Nutritional: Goal: Maintenance of adequate nutrition will improve Outcome: Not Progressing Goal: Progress toward achieving an optimal weight will improve Outcome: Not Progressing   Problem: Health Behavior/Discharge Planning: Goal: Ability to manage health-related needs will improve Outcome: Not Progressing   Problem: Clinical Measurements: Goal: Diagnostic test results will improve Outcome: Not Progressing   Problem: Activity: Goal: Risk for activity intolerance will decrease Outcome: Not Progressing   Problem: Nutrition: Goal: Adequate nutrition will be maintained Outcome: Not Progressing   Problem: Coping: Goal: Level of anxiety will decrease Outcome: Not Progressing   "

## 2024-05-09 NOTE — Progress Notes (Signed)
 Gave education to wife. Walked her through flushing an draining biliary drain. She demonstrated flushing and draining of biliary drain. Gave instructions for how to clean drain insertion area and place dressing. Wife stated that she had to leave d/t weather and approaching dark. Will let night shift know to reinforce this teaching tomorrow before DC.

## 2024-05-09 NOTE — Plan of Care (Signed)
  Problem: Coping: Goal: Ability to adjust to condition or change in health will improve Outcome: Progressing   Problem: Fluid Volume: Goal: Ability to maintain a balanced intake and output will improve Outcome: Progressing   Problem: Health Behavior/Discharge Planning: Goal: Ability to manage health-related needs will improve Outcome: Progressing   Problem: Metabolic: Goal: Ability to maintain appropriate glucose levels will improve Outcome: Progressing   Problem: Nutritional: Goal: Maintenance of adequate nutrition will improve Outcome: Progressing   

## 2024-05-09 NOTE — Progress Notes (Signed)
 [facility]  PROGRESS NOTE  John [name] FMW:[id number] DOB: Aug 16, 1945 DOA: 05/01/2024 PCP: [name], DO   Brief HPI:   79 y.o. M with sCHF EF 35-40%, CAD s/p PCI Apr 2025, DM, recurrent UTI and indwelling foley, streptococcal bacteremia last Sep, Atrial fibrillation and SSS and heart block s/p PPM, stroke with residual left weakness, history breast cancer, hx DVT, and dementia lives at home who presented with weakness and poor PO intake.   In the ER, noted to have gross hematuria and purulent urine in bag, hypotension and rapid Afib so admitted to ICU on pressors and antibiotics.      Assessment/Plan:   Septic shock, CAUTI and acute cholecystitis Presented with tachycardia, leukocytosis, encephalopathy, lactic acid 5.4, renal failure, and hypotension requiring pressors.   Blood cultures negative, urine culture growing VRE, CT imaging and follow-up HIDA scan confirmed cholecystitis.  General surgery were consulted, recommended percutaneous drainage.   Percutaneous cholecystostomy tube placed 1/23.  White count improved from previous, but still elevated, clinically much better today - Continue Zosyn , day 6 of 7 - Follow-up with IR for cholangiogram postdischarge in 4 weeks - Follow-up with general surgery in 6 weeks for possible gallbladder removal - Needs follow-up with cardiology within 4 weeks for preoperative risk assessment prior to surgical gallbladder removal, and prostate surgery     Acute metabolic encephalopathy Dementia No change, very HOH - Continue escitalopram , mirtazapine , donepezil    Coronary artery disease Cerebrovascular disease Hypertension Chronic systolic congestive heart failure Blood pressure normal off medication, appears euvolemic -Continue aspirin , atorvastatin , Eliquis  - Hold amlodipine    Diabetes melitis type II Glucose controlled off Lantus  - Hold Lantus  - Continue sliding scale corrections   Chronic indwelling  Foley Has BPH, follows with Dr. [name].  Foley changed 2 days prior to admission at facility.  Family are hopeful for prostate surgery so that Foley catheter can be removed, but this has been delayed due to his stroke last fall.   Follow-up with urology as outpatient   Chronic atrial fibrillation Sick sinus syndrome History of pacemaker See above - Continue apixaban    Hyponatremia Improved to normal with fluids   Hypokalemia - Supplement K -Follow BMP in am   AKI Cr 1.4 on admission, improved to 0.9 .   Anemia Hgb stable, no bleeding        DVT prophylaxis:   Medications     apixaban   5 mg Oral BID   atorvastatin   80 mg Oral QHS   donepezil   5 mg Oral QHS   escitalopram   10 mg Oral Daily   feeding supplement  237 mL Oral BID BM   Gerhardt's butt cream   Topical TID   insulin  aspart  0-15 Units Subcutaneous Q4H   mirtazapine   7.5 mg Oral QHS   pantoprazole   40 mg Oral Daily   sodium chloride  flush  5 mL Intracatheter Q8H     Data Reviewed:   CBG:  Recent Labs  Lab 05/08/24 1148 05/08/24 1620 05/08/24 2001 05/08/24 2356 05/09/24 0402  GLUCAP 145* 168* 204* 204* 174*    SpO2: 94 % O2 Flow Rate (L/min): 2 L/min    Vitals:   05/09/24 0125 05/09/24 0358 05/09/24 0422 05/09/24 0750  BP:  (!) 127/58  128/75  Pulse:  83    Resp: (!) 22 (!) 24  14  Temp:  98.2 F (36.8 C)    TempSrc:  Oral    SpO2:    94%  Weight:   90.4  kg   Height:          Data Reviewed:  Basic Metabolic Panel: Recent Labs  Lab 05/03/24 0650 05/04/24 0958 05/05/24 0233 05/06/24 1021 05/07/24 0016 05/08/24 0227 05/09/24 0259  NA 135   < > 140 137 133* 134* 136  K 3.4*   < > 3.5 3.3* 4.1 3.3* 3.4*  CL 101   < > 106 104 101 101 103  CO2 24   < > 25 23 20* 24 25  GLUCOSE 140*   < > 131* 168* 172* 127* 177*  BUN 23   < > 28* 28* 25* 19 17  CREATININE 0.90   < > 0.86 0.85 0.86 0.69 0.72  CALCIUM  8.9   < > 8.8* 8.8* 9.0 8.8* 8.9  MG 2.3  --   --   --   --   --    --    < > = values in this interval not displayed.    CBC: Recent Labs  Lab 05/05/24 0233 05/06/24 0228 05/07/24 0016 05/08/24 0227 05/09/24 0259  WBC 14.7* 13.5* 14.3* 15.0* 16.2*  HGB 10.2* 10.1* 11.0* 10.4* 10.7*  HCT 30.6* 30.9* 34.7* 31.2* 31.5*  MCV 84.3 85.4 88.7 83.2 81.8  PLT 182 169 242 251 304    LFT Recent Labs  Lab 05/05/24 0233 05/06/24 1021 05/07/24 0016 05/08/24 0227 05/09/24 0259  AST 74* 59* 71* 35 23  ALT 39 30 31 20 14   ALKPHOS 350* 430* 498* 398* 299*  BILITOT 0.8 0.9 0.9 0.9 0.8  PROT 4.8* 4.8* 5.4* 5.0* 5.0*  ALBUMIN 2.0* 1.9* 1.9* 2.0* 2.1*     Antibiotics: Anti-infectives (From admission, onward)    Start     Dose/Rate Route Frequency Ordered Stop   05/03/24 2200  piperacillin -tazobactam (ZOSYN ) IVPB 3.375 g       Placed in Followed by Linked Group   3.375 g 12.5 mL/hr over 240 Minutes Intravenous Every 8 hours 05/03/24 1639 05/08/24 2359   05/03/24 1730  piperacillin -tazobactam (ZOSYN ) IVPB 3.375 g       Placed in Followed by Linked Group   3.375 g 100 mL/hr over 30 Minutes Intravenous  Once 05/03/24 1639 05/03/24 1744   05/03/24 0945  ampicillin  (OMNIPEN) 1 g in sodium chloride  0.9 % 100 mL IVPB  Status:  Discontinued        1 g 300 mL/hr over 20 Minutes Intravenous Every 6 hours 05/03/24 0859 05/03/24 1623   05/02/24 2200  meropenem  (MERREM ) 1 g in sodium chloride  0.9 % 100 mL IVPB  Status:  Discontinued        1 g 200 mL/hr over 30 Minutes Intravenous Every 12 hours 05/02/24 1325 05/03/24 0859   05/02/24 1015  meropenem  (MERREM ) 2 g in sodium chloride  0.9 % 100 mL IVPB  Status:  Discontinued        2 g 280 mL/hr over 30 Minutes Intravenous Every 12 hours 05/02/24 0922 05/02/24 1325   05/01/24 2100  ceFEPIme  (MAXIPIME ) 2 g in sodium chloride  0.9 % 100 mL IVPB  Status:  Discontinued        2 g 200 mL/hr over 30 Minutes Intravenous Every 12 hours 05/01/24 2038 05/02/24 0917   05/01/24 1700  vancomycin  (VANCOREADY) IVPB 1500  mg/300 mL        1,500 mg 150 mL/hr over 120 Minutes Intravenous  Once 05/01/24 1649 05/01/24 2050   05/01/24 1700  meropenem  (MERREM ) 1 g in sodium chloride  0.9 % 100 mL IVPB  1 g 200 mL/hr over 30 Minutes Intravenous NOW 05/01/24 1651 05/01/24 1755   05/01/24 1645  ceFEPIme  (MAXIPIME ) 2 g in sodium chloride  0.9 % 100 mL IVPB  Status:  Discontinued        2 g 200 mL/hr over 30 Minutes Intravenous  Once 05/01/24 1641 05/01/24 1650   05/01/24 1645  metroNIDAZOLE  (FLAGYL ) IVPB 500 mg  Status:  Discontinued        500 mg 100 mL/hr over 60 Minutes Intravenous  Once 05/01/24 1641 05/01/24 1650   05/01/24 1645  vancomycin  (VANCOCIN ) IVPB 1000 mg/200 mL premix  Status:  Discontinued        1,000 mg 200 mL/hr over 60 Minutes Intravenous  Once 05/01/24 1641 05/01/24 1649        CONSULTS general surgery, IR  Code Status: DNR  Family Communication: Discussed with patient's wife on phone     Subjective   Patient seen, denies any complaints.   Objective    Physical Examination:   Appears lethargic, somnolent but arousable, answering questions appropriately S1-S2, regular, no murmur auscultated Lungs are clear to auscultation bilaterally Abdomen is soft, nontender, no organomegaly Extremities no edema     Wound 05/01/24 2200 Pressure Injury Sacrum Mid Stage 2 -  Partial thickness loss of dermis presenting as a shallow open injury with a red, pink wound bed without slough. (Active)        [name]   [facility] If 7PM-7AM, please contact night-coverage at [email or web address], Office  [phone]   05/09/2024, 8:10 AM  LOS: 8 days

## 2024-05-09 NOTE — TOC Progression Note (Signed)
 Transition of Care [facility]) - Progression Note    Patient Details  Name: John [name] MRN: [id number] Date of Birth: [birth year]  [facility] CM/SW Contact  Waddell Barnie Rama, RN Phone Number: 05/09/2024, 3:49 PM  Clinical Narrative:    Per MD patient wife wants to take patient home now with hospice.  NCM contacted wife in the room at bedside, she states she would like HOP,  she states patient has hosp bed, walker and bsc.  NCM made referral to [facility], she will contact the wife.    Expected Discharge Plan: Skilled Nursing Facility Barriers to Discharge: Continued Medical Work up               Expected Discharge Plan and Services In-house Referral: Clinical Social Work     Living arrangements for the past 2 months: Skilled Nursing Facility                                       Social Drivers of Health (SDOH) Interventions SDOH Screenings   Food Insecurity: Patient Unable To Answer (05/01/2024)  Housing: Unknown (05/01/2024)  Transportation Needs: Patient Unable To Answer (05/01/2024)  Utilities: Patient Unable To Answer (05/01/2024)  Alcohol Screen: Low Risk (01/27/2023)  Depression (PHQ2-9): Low Risk (01/28/2023)  Financial Resource Strain: Low Risk (10/03/2023)  Physical Activity: Inactive (10/03/2023)  Social Connections: Patient Unable To Answer (05/01/2024)  Stress: No Stress Concern Present (10/03/2023)  Tobacco Use: Low Risk (05/04/2024)    Readmission Risk Interventions    12/06/2023    4:03 PM 05/11/2023   12:52 PM 04/22/2023   12:12 PM  Readmission Risk Prevention Plan  Medication Screening   Complete  Transportation Screening Complete Complete Complete  PCP or Specialist Appt within 3-5 Days  Complete   HRI or Home Care Consult  Complete   Social Work Consult for Recovery Care Planning/Counseling  Complete   Palliative Care Screening  Not Applicable   Medication Review Oceanographer) Complete Complete   HRI or Home Care  Consult Complete    SW Recovery Care/Counseling Consult Complete    Palliative Care Screening Not Applicable    Skilled Nursing Facility Not Applicable

## 2024-05-10 ENCOUNTER — Other Ambulatory Visit (HOSPITAL_BASED_OUTPATIENT_CLINIC_OR_DEPARTMENT_OTHER): Payer: Self-pay

## 2024-05-10 ENCOUNTER — Other Ambulatory Visit (HOSPITAL_COMMUNITY): Payer: Self-pay

## 2024-05-10 LAB — GLUCOSE, CAPILLARY
Glucose-Capillary: 120 mg/dL — ABNORMAL HIGH (ref 70–99)
Glucose-Capillary: 168 mg/dL — ABNORMAL HIGH (ref 70–99)
Glucose-Capillary: 198 mg/dL — ABNORMAL HIGH (ref 70–99)
Glucose-Capillary: 91 mg/dL (ref 70–99)

## 2024-05-10 LAB — BASIC METABOLIC PANEL WITH GFR
Anion gap: 7 (ref 5–15)
BUN: 17 mg/dL (ref 8–23)
CO2: 27 mmol/L (ref 22–32)
Calcium: 9 mg/dL (ref 8.9–10.3)
Chloride: 104 mmol/L (ref 98–111)
Creatinine, Ser: 0.87 mg/dL (ref 0.61–1.24)
GFR, Estimated: >60 mL/min
Glucose, Bld: 102 mg/dL — ABNORMAL HIGH (ref 70–99)
Potassium: 3.7 mmol/L (ref 3.5–5.1)
Sodium: 138 mmol/L (ref 135–145)

## 2024-05-10 MED ORDER — LOPERAMIDE HCL 2 MG PO CAPS
2.0000 mg | ORAL_CAPSULE | ORAL | 0 refills | Status: AC | PRN
  Filled 2024-05-10: qty 30, 30d supply, fill #0

## 2024-05-10 MED ORDER — APIXABAN 5 MG PO TABS
5.0000 mg | ORAL_TABLET | Freq: Two times a day (BID) | ORAL | 0 refills | Status: AC
  Filled 2024-05-10: qty 60, 30d supply, fill #0

## 2024-05-10 MED ORDER — SODIUM CHLORIDE 0.9% FLUSH: 5.0000 mL | Freq: Three times a day (TID) | INTRAVENOUS | 0 refills | Status: AC

## 2024-05-10 NOTE — Discharge Summary (Signed)
 " Physician Discharge Summary   Patient: John [name] MRN: [id number] DOB: Sep 29, 1945  Admit date:     05/01/2024  Discharge date: 05/10/24  Discharge Physician: [name]   PCP: [name], DO   Recommendations at discharge:   Follow-up with [facility] hospice Follow-up general surgery as outpatient Continue flushing biliary drain with 5 mL saline 3 times a day  Discharge Diagnoses: Principal Problem:   Sepsis (HCC)  Resolved Problems:   * No resolved hospital problems. [facility] Course: 79 y.o. M with sCHF EF 35-40%, CAD s/p PCI Apr 2025, DM, recurrent UTI and indwelling foley, streptococcal bacteremia last Sep, Atrial fibrillation and SSS and heart block s/p PPM, stroke with residual left weakness, history breast cancer, hx DVT, and dementia lives at home who presented with weakness and poor PO intake.  In the ER, noted to have gross hematuria and purulent urine in bag, hypotension and rapid Afib so admitted to ICU on pressors and antibiotics.  Assessment and Plan:  Septic shock, CAUTI and acute cholecystitis Presented with tachycardia, leukocytosis, encephalopathy, lactic acid 5.4, renal failure, and hypotension requiring pressors.   Blood cultures negative, urine culture growing VRE, CT imaging and follow-up HIDA scan confirmed cholecystitis.  General surgery were consulted, recommended percutaneous drainage.   Percutaneous cholecystostomy tube placed 1/23.   -Completed 7 days of IV Zosyn  - Follow-up with IR for cholangiogram postdischarge in 4 weeks - Follow-up with general surgery in 6 weeks for possible gallbladder removal Patient is going home with home hospice     Acute metabolic encephalopathy Dementia No change, very HOH - Continue escitalopram , mirtazapine , donepezil    Coronary artery disease Cerebrovascular disease Hypertension Chronic systolic congestive heart failure Blood pressure normal off medication, appears euvolemic -Continue   Eliquis  - Hold amlodipine  due to low blood pressure   Diabetes melitis type II Glucose controlled off Lantus  - Hold Lantus     Chronic indwelling Foley Has BPH, follows with Dr. [name].  Foley changed 2 days prior to admission at facility.  Family are hopeful for prostate surgery so that Foley catheter can be removed, but this has been delayed due to his stroke last fall.   Follow-up with urology as outpatient   Chronic atrial fibrillation Sick sinus syndrome History of pacemaker See above - Continue apixaban    Hyponatremia Improved to normal with fluids   Hypokalemia - Replete   AKI Cr 1.4 on admission, improved to 0.9 .   Anemia Hgb stable, no bleeding            Consultants: General surgery Procedures performed: Biliary drain placement Disposition: Home Diet recommendation:  Regular diet DISCHARGE MEDICATION: Allergies as of 05/10/2024       Reactions   Ramipril Anaphylaxis   Chlorhexidine     Adhesive [tape] Rash   Other Diarrhea   Severe intolerance to Chemotherapy in the past.   Sertraline Other (See Comments)   Extreme headaches        Medication List     STOP taking these medications    amLODipine  5 MG tablet Commonly known as: NORVASC    aspirin  EC 81 MG tablet   atorvastatin  80 MG tablet Commonly known as: LIPITOR    magnesium  oxide 400 (240 Mg) MG tablet Commonly known as: MAG-OX   senna-docusate 8.6-50 MG tablet Commonly known as: Senokot-S   tiZANidine 2 MG tablet Commonly known as: ZANAFLEX       TAKE these medications    apixaban  5 MG Tabs tablet Commonly known as:  ELIQUIS  Take 1 tablet (5 mg total) by mouth 2 (two) times daily.   donepezil  5 MG tablet Commonly known as: ARICEPT  Take 1 tablet (5 mg total) by mouth at bedtime.   escitalopram  10 MG tablet Commonly known as: Lexapro  Take 1 tablet (10 mg total) by mouth daily.   Insulin  Aspart FlexPen 100 UNIT/ML Commonly known as: NOVOLOG  Inject 3 Units into  the skin 3 (three) times daily before meals. Sliding scale: if BG is 70 - 100= 0 units, 121-150= 1 unit, 151-200= 2 units, 201-250= 3 units, 251-300= 5 units, 301-350= 7 units, 351--400= 9 units, if greater than 400 call NP/PA   Lantus  SoloStar 100 UNIT/ML Solostar Pen Generic drug: insulin  glargine Inject 10 Units into the skin in the morning.   lidocaine  4 % Place 1 patch onto the skin daily.   loperamide  2 MG capsule Commonly known as: IMODIUM  Take 1 capsule (2 mg total) by mouth as needed for diarrhea or loose stools.   mirtazapine  7.5 MG tablet Commonly known as: REMERON  Take 1 tablet (7.5 mg total) by mouth at bedtime.   pantoprazole  40 MG tablet Commonly known as: PROTONIX  Take 1 tablet (40 mg total) by mouth at bedtime.   polyethylene glycol powder 17 GM/SCOOP powder Commonly known as: GLYCOLAX /MIRALAX  Take 17 g by mouth daily as needed. Dissolve 1 capful (17g) in 4-8 ounces of liquid and take by mouth daily.   promethazine  25 MG/ML injection Commonly known as: PHENERGAN  Inject 12.5-25 mg into the muscle every 12 (twelve) hours as needed for nausea or vomiting. May also give 25mg  into the muscle every 6 hours as needed   sodium chloride  flush 0.9 % Soln Commonly known as: NS 5 mLs by Other route every 8 (eight) hours. Biliary drain        Contact information for follow-up providers     Vanice Sharper, MD Follow up.   Specialties: Interventional Radiology, Radiology Why: Please ensure you have a follow up cholangiogram before your appointment with Dr. Dasie Pass information: [address] [place] [address] [phone]         Dasie Leonor CROME, MD Follow up.   Specialty: General Surgery Why: Call to confirm your appointment date and time, bring a copy of your photo ID and insurance card, arrive 30 minutes prior to your appointment Contact information: [address] [name] [address] [phone]         [facility] Follow up.   Why: home hospice Contact information: [address] [place] [address] [phone]              Contact information for after-discharge care     Destination     [facility] .   Service: Skilled Nursing Contact information: [address] [place]  [address] [phone]                    Discharge Exam: [name]   05/08/24 0342 05/09/24 0422 05/10/24 0347  Weight: 91.1 kg 90.4 kg 90.1 kg   General-appears in no acute distress Heart-S1-S2, regular, no murmur auscultated Lungs-clear to auscultation bilaterally, no wheezing or crackles auscultated Abdomen-soft, nontender, no organomegaly Extremities-no edema in the lower extremities Neuro-alert, oriented x3, no focal deficit noted  Condition at discharge: good  The results of significant diagnostics from this hospitalization (including imaging, microbiology, ancillary and laboratory) are listed below for reference.   Imaging Studies: IR  Perc Cholecystostomy Result Date: 05/04/2024 INDICATION: Acute cholecystitis EXAM: ULTRASOUND AND FLUOROSCOPIC 10 FRENCH PERCUTANEOUS TRANSHEPATIC CHOLECYSTOSTOMY MEDICATIONS: Patient is already receiving Zosyn  IV as an inpatient; The antibiotic was administered within an appropriate time frame prior to the initiation of the procedure. ANESTHESIA/SEDATION: Moderate (conscious) sedation was employed during this procedure. A total of Versed  1.0 mg and Fentanyl  50 mcg was administered intravenously by the radiology nurse. Total intra-service moderate Sedation Time: 14 minutes. The patient's level of consciousness and vital signs were monitored continuously by radiology nursing throughout the procedure under my direct supervision. FLUOROSCOPY: Radiation Exposure Index (as provided by the fluoroscopic device): 5.0 mGy Kerma COMPLICATIONS: None immediate. PROCEDURE: Informed written consent was  obtained from the patient after a thorough discussion of the procedural risks, benefits and alternatives. All questions were addressed. Maximal Sterile Barrier Technique was utilized including caps, mask, sterile gowns, sterile gloves, sterile drape, hand hygiene and skin antiseptic. A timeout was performed prior to the initiation of the procedure. Previous imaging reviewed. Preliminary ultrasound performed. The distended gallbladder was localized and marked through a lower intercostal space in the mid axillary line. Under sterile conditions and local anesthesia, an 18 gauge needle was advanced percutaneously through a transhepatic window into the gallbladder. There was return of bile. Sample sent for culture. Guidewire inserted followed by tract dilatation to insert a 10 French drain. Retention loop formed in the gallbladder. Gallbladder was decompressed by syringe aspiration yielding proximally 200 cc. Position confirmed with fluoroscopy as well. Catheter secured with a silk suture and a sterile dressing. Gravity drainage bag connected. No immediate complication. Patient tolerated the procedure well. IMPRESSION: Successful ultrasound and fluoroscopic 10 French percutaneous transhepatic cholecystostomy Electronically Signed   By: CHRISTELLA.  Shick M.D.   On: 05/04/2024 16:41   NM Hepatobiliary Liver Func Result Date: 05/03/2024 EXAM: NM HEPATOBILLARY SCAN 05/03/2024 03:52:56 PM TECHNIQUE: RADIOPHARMACEUTICAL: 5.4 mCi Tc-55m mebrofenin  Dynamic images of the abdomen and pelvis were obtained in the anterior projection for 1 hour after intravenous administration of radiopharmaceutical. The gallbladder failed to fill at 45 minutes of imaging. Intravenous morphine  was administered to augment filling of the gallbladder. The gallbladder failed to fill after morphine  augmentation. Imaging continued for at least 30 minutes after morphine  administration. COMPARISON: CT 03/13/2025 and ultrasound 03/12/2025. CLINICAL HISTORY:  Abdominal pain, acute, nonlocalized. Acute, nonlocalized abdominal pain. FINDINGS: Homogenous uptake within the liver. Normal clearance of the blood pool. The gallbladder failed to fill at 45 minutes of imaging. Intravenous morphine  was administered to augment filling of the gallbladder. The gallbladder failed to fill after morphine  augmentation. This is suggestive of acute cholecystitis. Appropriate excretion into the biliary system. Patent common bile duct. Activity is visualized in the small bowel. IMPRESSION: 1. Nonvisualization of the gallbladder despite morphine  augmentation, suggestive of acute cholecystitis. 2. Patent common bile duct. 3. These results will be called to the ordering clinician or representative by the radiologist assistant, and communication documented in the pacs or clario dashboard. Electronically signed by: Norleen Boxer MD 05/03/2024 04:00 PM EST RP Workstation: [id number]   US  Abdomen Limited RUQ (LIVER/GB) Result Date: 05/02/2024 EXAM: Right Upper Quadrant Abdominal Ultrasound 05/02/2024 08:10:00 PM TECHNIQUE: Real-time ultrasonography of the right upper quadrant of the abdomen was performed. COMPARISON: US  Abdomen Limited 08/07/2023, and CT abdomen and pelvis with IV contrast 05/01/2024 07:09 PM. CLINICAL HISTORY: RUQ pain. FINDINGS: LIVER: Normal echogenicity. No intrahepatic biliary ductal dilatation. No evidence of mass. Hepatopetal flow in the portal vein. Minimal perihepatic ascites is again noted. BILIARY SYSTEM: Dilatation of the  gallbladder up to 11 cm in length is again noted, with sludge and stones in the proximal lumen, largest gallstone measuring 1.4 cm. Gallbladder wall thickening is seen along the gallbladder fossa up to 5 to 6 mm, but there was no positive sonographic Murphy sign, although technologist noted altered mental status and acute cholecystitis is not excluded. No pericholecystic fluid. The common bile duct measures 4.2 mm. IMPRESSION: 1. Dilatation of the  gallbladder with sludge and stones, largest gallstone measuring 1.4 cm, and gallbladder wall thickening up to 5 to 6 mm without a positive sonographic Murphy sign in a patient with altered mental status according to the technologist ; acute cholecystitis is not excluded. 2. Minimal perihepatic ascites, without pericholecystic fluid. 3. No biliary dilatation . Electronically signed by: Francis Quam MD 05/02/2024 08:46 PM EST RP Workstation: [id number]   ECHOCARDIOGRAM COMPLETE Result Date: 05/02/2024    ECHOCARDIOGRAM REPORT   Patient Name:   ZEEV DEAKINS Eastwood Date of Exam: 05/02/2024 Medical Rec #:  [id number]        Height:       72.0 in Accession #:    [id number]       Weight:       198.9 lb Date of Birth:  1945/06/29        BSA:          2.125 m Patient Age:    78 years         BP:           108/63 mmHg Patient Gender: M                HR:           72 bpm. Exam Location:  Inpatient Procedure: 2D Echo, Cardiac Doppler and Color Doppler (Both Spectral and Color            Flow Doppler were utilized during procedure). Indications:    Atrila Fibrillation  History:        Patient has prior history of Echocardiogram examinations, most                 recent 08/11/2023. CHF, Acute MI; Risk Factors:Dyslipidemia and                 Diabetes.  Sonographer:    Sherlean Dubin Referring Phys: [id number] OMAR M ALBUSTAMI IMPRESSIONS  1. Left ventricular ejection fraction, by estimation, is 35 to 40%. The left ventricle has moderately decreased function. The left ventricle demonstrates global hypokinesis. There is mild concentric left ventricular hypertrophy. Left ventricular diastolic parameters are consistent with Grade I diastolic dysfunction (impaired relaxation).  2. Right ventricular systolic function is normal. The right ventricular size is mildly enlarged.  3. The mitral valve is normal in structure. No evidence of mitral valve regurgitation. No evidence of mitral stenosis.  4. The aortic valve is normal in structure.  There is mild thickening of the aortic valve. Aortic valve regurgitation is trivial. Aortic valve sclerosis/calcification is present, without any evidence of aortic stenosis.  5. The inferior vena cava is normal in size with greater than 50% respiratory variability, suggesting right atrial pressure of 3 mmHg. FINDINGS  Left Ventricle: Left ventricular ejection fraction, by estimation, is 35 to 40%. The left ventricle has moderately decreased function. The left ventricle demonstrates global hypokinesis. The left ventricular internal cavity size was normal in size. There is mild concentric left ventricular hypertrophy. Left ventricular diastolic parameters are consistent with Grade I diastolic dysfunction (impaired relaxation). Right Ventricle: The  right ventricular size is mildly enlarged. No increase in right ventricular wall thickness. Right ventricular systolic function is normal. Left Atrium: Left atrial size was normal in size. Right Atrium: Right atrial size was normal in size. Pericardium: There is no evidence of pericardial effusion. Presence of epicardial fat layer. Mitral Valve: The mitral valve is normal in structure. No evidence of mitral valve regurgitation. No evidence of mitral valve stenosis. MV peak gradient, 6.7 mmHg. The mean mitral valve gradient is 3.0 mmHg. Tricuspid Valve: The tricuspid valve is normal in structure. Tricuspid valve regurgitation is not demonstrated. No evidence of tricuspid stenosis. Aortic Valve: The aortic valve is normal in structure. There is mild thickening of the aortic valve. There is mild aortic valve annular calcification. Aortic valve regurgitation is trivial. Aortic valve sclerosis/calcification is present, without any evidence of aortic stenosis. Aortic valve mean gradient measures 3.0 mmHg. Aortic valve peak gradient measures 5.2 mmHg. Aortic valve area, by VTI measures 2.19 cm. Pulmonic Valve: The pulmonic valve was normal in structure. Pulmonic valve  regurgitation is not visualized. No evidence of pulmonic stenosis. Aorta: The aortic root is normal in size and structure. Venous: The inferior vena cava is normal in size with greater than 50% respiratory variability, suggesting right atrial pressure of 3 mmHg. IAS/Shunts: No atrial level shunt detected by color flow Doppler.  LEFT VENTRICLE PLAX 2D LVIDd:         4.40 cm   Diastology LVIDs:         3.60 cm   LV e' medial:    6.66 cm/s LV PW:         1.20 cm   LV E/e' medial:  11.8 LV IVS:        1.10 cm   LV e' lateral:   12.80 cm/s LVOT diam:     2.00 cm   LV E/e' lateral: 6.1 LV SV:         53 LV SV Index:   25 LVOT Area:     3.14 cm  RIGHT VENTRICLE RV Basal diam:  3.60 cm RV Mid diam:    3.30 cm RV S prime:     9.99 cm/s TAPSE (M-mode): 2.0 cm LEFT ATRIUM           Index        RIGHT ATRIUM           Index LA diam:      4.00 cm 1.88 cm/m   RA Area:     19.20 cm LA Vol (A2C): 60.0 ml 28.23 ml/m  RA Volume:   51.70 ml  24.33 ml/m LA Vol (A4C): 52.4 ml 24.65 ml/m  AORTIC VALVE AV Area (Vmax):    2.38 cm AV Area (Vmean):   2.19 cm AV Area (VTI):     2.19 cm AV Vmax:           114.50 cm/s AV Vmean:          80.850 cm/s AV VTI:            0.240 m AV Peak Grad:      5.2 mmHg AV Mean Grad:      3.0 mmHg LVOT Vmax:         86.90 cm/s LVOT Vmean:        56.350 cm/s LVOT VTI:          0.167 m LVOT/AV VTI ratio: 0.70  AORTA Ao Root diam: 2.90 cm Ao Asc diam:  3.20 cm MITRAL VALVE  TRICUSPID VALVE MV Area (PHT): 3.85 cm     TR Peak grad:   4.5 mmHg MV Area VTI:   1.56 cm     TR Vmax:        106.00 cm/s MV Peak grad:  6.7 mmHg MV Mean grad:  3.0 mmHg     SHUNTS MV Vmax:       1.29 m/s     Systemic VTI:  0.17 m MV Vmean:      81.6 cm/s    Systemic Diam: 2.00 cm MV Decel Time: 197 msec MV E velocity: 78.40 cm/s MV A velocity: 110.00 cm/s MV E/A ratio:  0.71 Kardie Tobb DO Electronically signed by Dub Huntsman DO Signature Date/Time: 05/02/2024/4:38:54 PM    Final    CT ABDOMEN PELVIS W  CONTRAST Result Date: 05/01/2024 EXAM: CT ABDOMEN AND PELVIS WITH CONTRAST 05/01/2024 07:13:00 PM TECHNIQUE: CT of the abdomen and pelvis was performed with the administration of intravenous contrast. Multiplanar reformatted images are provided for review. Automated exposure control, iterative reconstruction, and/or weight-based adjustment of the mA/kV was utilized to reduce the radiation dose to as low as reasonably achievable. COMPARISON: 01/29/2024 CLINICAL HISTORY: Abd pain, n/v/d FINDINGS: LOWER CHEST: Airspace opacity in the left lower lobe could reflect atelectasis or pneumonia. LIVER: The liver is unremarkable. GALLBLADDER AND BILE DUCTS: The gallbladder is distended, similar to prior study. Layering gallstones within the gallbladder. Small amount of air noted in the fundus of the gallbladder, question recent instrumentation or prior sphincterotomy. SPLEEN: Calcifications throughout the spleen compatible with old granulomatous disease. PANCREAS: No acute abnormality. ADRENAL GLANDS: No acute abnormality. KIDNEYS, URETERS AND BLADDER: No stones in the kidneys or ureters. No hydronephrosis. No perinephric or periureteral stranding. The urinary bladder is thick-walled and could be related to cystitis or chronic bladder outlet obstruction. GI AND BOWEL: Stomach demonstrates no acute abnormality. There is no bowel obstruction. PERITONEUM AND RETROPERITONEUM: No ascites. No free air. VASCULATURE: Aortic atherosclerosis. LYMPH NODES: No lymphadenopathy. REPRODUCTIVE ORGANS: Mild enlargement of the prostate. BONES AND SOFT TISSUES: No acute osseous abnormality. No focal soft tissue abnormality. IMPRESSION: 1. Distended gallbladder with layering gallstones and small amount of air in the fundus, possibly related to recent instrumentation or prior sphincterotomy. Recommend clinical correlation . 2. Thick-walled urinary bladder, possibly related to cystitis or chronic bladder outlet obstruction. 3. Airspace opacity in  the left lower lobe, atelectasis or pneumonia. Electronically signed by: Franky Crease MD 05/01/2024 07:21 PM EST RP Workstation: [id number]   DG Chest 1 View Result Date: 05/01/2024 CLINICAL DATA:  Shortness of breath and cough. EXAM: CHEST  1 VIEW COMPARISON:  Chest radiograph dated 01/28/2024. FINDINGS: No focal consolidation, pleural effusion, pneumothorax. Stable cardiac silhouette. Left pectoral pacemaker device. No acute osseous pathology. IMPRESSION: No active disease. Electronically Signed   By: Vanetta Chou M.D.   On: 05/01/2024 17:00    Microbiology: Results for orders placed or performed during the hospital encounter of 05/01/24  C Difficile Quick Screen w PCR reflex     Status: None   Collection Time: 05/01/24  4:16 PM   Specimen: STOOL  Result Value Ref Range Status   C Diff antigen NEGATIVE NEGATIVE Final   C Diff toxin NEGATIVE NEGATIVE Final   C Diff interpretation No C. difficile detected.  Final    Comment: Performed at [facility], [address], [place], [place] [address]  Culture, blood (single)     Status: None   Collection Time: 05/01/24  4:41 PM  Specimen: BLOOD RIGHT WRIST  Result Value Ref Range Status   Specimen Description BLOOD RIGHT WRIST  Final   Special Requests   Final    BOTTLES DRAWN AEROBIC AND ANAEROBIC Blood Culture results may not be optimal due to an inadequate volume of blood received in culture bottles   Culture   Final    NO GROWTH 5 DAYS Performed at [facility], [address], [place], [place] [address]    Report Status 05/06/2024 FINAL  Final  Urine Culture     Status: Abnormal   Collection Time: 05/01/24  7:47 PM   Specimen: Urine, Random  Result Value Ref Range Status   Specimen Description URINE, RANDOM  Final   Special Requests   Final    NONE Reflexed from [phone] Performed at [facility], [address] [place], [place] [address]    Culture (A)  Final    >=100,000 COLONIES/mL ENTEROCOCCUS  GALLINARUM VANCOMYCIN  RESISTANT ENTEROCOCCUS ISOLATED 40,000 COLONIES/mL PROTEUS MIRABILIS    Report Status 05/04/2024 FINAL  Final   Organism ID, Bacteria ENTEROCOCCUS GALLINARUM (A)  Final   Organism ID, Bacteria PROTEUS MIRABILIS (A)  Final      Susceptibility   Enterococcus gallinarum - MIC*    AMPICILLIN  <=2 SENSITIVE Sensitive     NITROFURANTOIN <=16 SENSITIVE Sensitive     VANCOMYCIN  RESISTANT Resistant     * >=100,000 COLONIES/mL ENTEROCOCCUS GALLINARUM   Proteus mirabilis - MIC*    AMPICILLIN  <=2 SENSITIVE Sensitive     CEFAZOLIN  (URINE) Value in next row Sensitive      16 SENSITIVEThis is a modified FDA-approved test that has been validated and its performance characteristics determined by the reporting laboratory.  This laboratory is certified under the Clinical Laboratory Improvement Amendments CLIA as qualified to perform high complexity clinical laboratory testing.    CEFEPIME  Value in next row Sensitive      16 SENSITIVEThis is a modified FDA-approved test that has been validated and its performance characteristics determined by the reporting laboratory.  This laboratory is certified under the Clinical Laboratory Improvement Amendments CLIA as qualified to perform high complexity clinical laboratory testing.    ERTAPENEM Value in next row Sensitive      16 SENSITIVEThis is a modified FDA-approved test that has been validated and its performance characteristics determined by the reporting laboratory.  This laboratory is certified under the Clinical Laboratory Improvement Amendments CLIA as qualified to perform high complexity clinical laboratory testing.    CEFTRIAXONE  Value in next row Sensitive      16 SENSITIVEThis is a modified FDA-approved test that has been validated and its performance characteristics determined by the reporting laboratory.  This laboratory is certified under the Clinical Laboratory Improvement Amendments CLIA as qualified to perform high complexity  clinical laboratory testing.    CIPROFLOXACIN  Value in next row Sensitive      16 SENSITIVEThis is a modified FDA-approved test that has been validated and its performance characteristics determined by the reporting laboratory.  This laboratory is certified under the Clinical Laboratory Improvement Amendments CLIA as qualified to perform high complexity clinical laboratory testing.    GENTAMICIN  Value in next row Sensitive      16 SENSITIVEThis is a modified FDA-approved test that has been validated and its performance characteristics determined by the reporting laboratory.  This laboratory is certified under the Clinical Laboratory Improvement Amendments CLIA as qualified to perform high complexity clinical laboratory testing.    NITROFURANTOIN Value in next row Resistant  16 SENSITIVEThis is a modified FDA-approved test that has been validated and its performance characteristics determined by the reporting laboratory.  This laboratory is certified under the Clinical Laboratory Improvement Amendments CLIA as qualified to perform high complexity clinical laboratory testing.    TRIMETH/SULFA Value in next row Sensitive      16 SENSITIVEThis is a modified FDA-approved test that has been validated and its performance characteristics determined by the reporting laboratory.  This laboratory is certified under the Clinical Laboratory Improvement Amendments CLIA as qualified to perform high complexity clinical laboratory testing.    AMPICILLIN /SULBACTAM Value in next row Sensitive      16 SENSITIVEThis is a modified FDA-approved test that has been validated and its performance characteristics determined by the reporting laboratory.  This laboratory is certified under the Clinical Laboratory Improvement Amendments CLIA as qualified to perform high complexity clinical laboratory testing.    PIP/TAZO Value in next row Sensitive      <=4 SENSITIVEThis is a modified FDA-approved test that has been validated and  its performance characteristics determined by the reporting laboratory.  This laboratory is certified under the Clinical Laboratory Improvement Amendments CLIA as qualified to perform high complexity clinical laboratory testing.    MEROPENEM  Value in next row Sensitive      <=4 SENSITIVEThis is a modified FDA-approved test that has been validated and its performance characteristics determined by the reporting laboratory.  This laboratory is certified under the Clinical Laboratory Improvement Amendments CLIA as qualified to perform high complexity clinical laboratory testing.    * 40,000 COLONIES/mL PROTEUS MIRABILIS  MRSA Next Gen by PCR, Nasal     Status: None   Collection Time: 05/01/24  9:53 PM   Specimen: Nasal Mucosa; Nasal Swab  Result Value Ref Range Status   MRSA by PCR Next Gen NOT DETECTED NOT DETECTED Final    Comment: (NOTE) The GeneXpert MRSA Assay (FDA approved for NASAL specimens only), is one component of a comprehensive MRSA colonization surveillance program. It is not intended to diagnose MRSA infection nor to guide or monitor treatment for MRSA infections. Test performance is not FDA approved in patients less than 93 years old. Performed at [facility], [address] [place], [place] [address]   Culture, blood (Routine X 2) w Reflex to ID Panel     Status: None   Collection Time: 05/01/24 10:55 PM   Specimen: BLOOD LEFT HAND  Result Value Ref Range Status   Specimen Description BLOOD LEFT HAND  Final   Special Requests   Final    BOTTLES DRAWN AEROBIC AND ANAEROBIC Blood Culture adequate volume   Culture   Final    NO GROWTH 5 DAYS Performed at [facility], [address], [place], [place] [address]    Report Status 05/06/2024 FINAL  Final  Aerobic/Anaerobic Culture w Gram Stain (surgical/deep wound)     Status: None   Collection Time: 05/04/24  4:37 PM   Specimen: Gallbladder; Bile  Result Value Ref Range Status   Specimen Description GALL BLADDER   Final   Special Requests NONE  Final   Gram Stain NO WBC SEEN NO ORGANISMS SEEN   Final   Culture   Final    No growth aerobically or anaerobically. Performed at [facility], [address], [place], [place] [address]    Report Status 05/09/2024 FINAL  Final    Labs: CBC: Recent Labs  Lab 05/05/24 0233 05/06/24 0228 05/07/24 0016 05/08/24 0227 05/09/24  0259  WBC 14.7* 13.5* 14.3* 15.0* 16.2*  HGB 10.2* 10.1* 11.0* 10.4* 10.7*  HCT 30.6* 30.9* 34.7* 31.2* 31.5*  MCV 84.3 85.4 88.7 83.2 81.8  PLT 182 169 242 251 304   Basic Metabolic Panel: Recent Labs  Lab 05/06/24 1021 05/07/24 0016 05/08/24 0227 05/09/24 0259 05/10/24 0232  NA 137 133* 134* 136 138  K 3.3* 4.1 3.3* 3.4* 3.7  CL 104 101 101 103 104  CO2 23 20* 24 25 27   GLUCOSE 168* 172* 127* 177* 102*  BUN 28* 25* 19 17 17   CREATININE 0.85 0.86 0.69 0.72 0.87  CALCIUM  8.8* 9.0 8.8* 8.9 9.0   Liver Function Tests: Recent Labs  Lab 05/05/24 0233 05/06/24 1021 05/07/24 0016 05/08/24 0227 05/09/24 0259  AST 74* 59* 71* 35 23  ALT 39 30 31 20 14   ALKPHOS 350* 430* 498* 398* 299*  BILITOT 0.8 0.9 0.9 0.9 0.8  PROT 4.8* 4.8* 5.4* 5.0* 5.0*  ALBUMIN 2.0* 1.9* 1.9* 2.0* 2.1*   CBG: Recent Labs  Lab 05/09/24 2005 05/10/24 0006 05/10/24 0410 05/10/24 0813 05/10/24 1041  GLUCAP 186* 168* 91 120* 198*    Discharge time spent: greater than 30 minutes.  Signed: [name], MD [facility] 05/10/2024 "

## 2024-05-10 NOTE — TOC Transition Note (Addendum)
 Transition of Care [facility]) - Discharge Note   Patient Details  Name: John [name] MRN: [id number] Date of Birth: 1946-04-11  [facility] CM/SW Contact:  Waddell Barnie Rama, RN Phone Number: 05/10/2024, 1:59 PM   Clinical Narrative:    For dc today, [facility].  Wife at bedside, ROME has been scheduled.     Barriers to Discharge: Continued Medical Work up   Patient Goals and CMS Choice Patient states their goals for this hospitalization and ongoing recovery are:: pt oriented x1 CMS Medicare.gov Compare Post Acute Care list provided to:: Patient Represenative (must comment) (Spouse) Choice offered to / list presented to : Spouse      Discharge Placement                       Discharge Plan and Services Additional resources added to the After Visit Summary for   In-house Referral: Clinical Social Work                                   Social Drivers of Health (SDOH) Interventions SDOH Screenings   Food Insecurity: Patient Unable To Answer (05/01/2024)  Housing: Unknown (05/01/2024)  Transportation Needs: Patient Unable To Answer (05/01/2024)  Utilities: Patient Unable To Answer (05/01/2024)  Alcohol Screen: Low Risk (01/27/2023)  Depression (PHQ2-9): Low Risk (01/28/2023)  Financial Resource Strain: Low Risk (10/03/2023)  Physical Activity: Inactive (10/03/2023)  Social Connections: Patient Unable To Answer (05/01/2024)  Stress: No Stress Concern Present (10/03/2023)  Tobacco Use: Low Risk (05/04/2024)     Readmission Risk Interventions    12/06/2023    4:03 PM 05/11/2023   12:52 PM 04/22/2023   12:12 PM  Readmission Risk Prevention Plan  Medication Screening   Complete  Transportation Screening Complete Complete Complete  PCP or Specialist Appt within 3-5 Days  Complete   HRI or Home Care Consult  Complete   Social Work Consult for Recovery Care Planning/Counseling  Complete   Palliative Care Screening   Not Applicable   Medication Review Oceanographer) Complete Complete   HRI or Home Care Consult Complete    SW Recovery Care/Counseling Consult Complete    Palliative Care Screening Not Applicable    Skilled Nursing Facility Not Applicable

## 2024-05-10 NOTE — Progress Notes (Signed)
" ° °  This pt was referred to [facility] for hospice care at home. I have confirmed with wife that this is the pt's and her wishes with goals of comfort care. She confirms pt wishes are DNR. She has all equipment needs in the home at this time.  Te pt was reviewed with our MD and has been accepted as a pt for hospice care at home.    Plan: When pt is ready for d/c then will meet wife at hospital to obtain all needed paperwork to d/c home with hospice care.   Magdalena Berber RN [phone]  "

## 2024-05-11 ENCOUNTER — Other Ambulatory Visit (HOSPITAL_BASED_OUTPATIENT_CLINIC_OR_DEPARTMENT_OTHER): Payer: Self-pay

## 2024-05-11 MED ORDER — MORPHINE SULFATE (CONCENTRATE) 10 MG /0.5 ML PO SOLN
5.0000 mg | ORAL | 0 refills | Status: AC | PRN
  Filled 2024-05-11: qty 30, 20d supply, fill #0

## 2024-05-11 MED ORDER — LORAZEPAM 0.5 MG PO TABS
0.5000 mg | ORAL_TABLET | Freq: Four times a day (QID) | ORAL | 0 refills | Status: AC | PRN
  Filled 2024-05-11: qty 10, 3d supply, fill #0

## 2024-05-11 MED ORDER — BISACODYL 10 MG RE SUPP
10.0000 mg | Freq: Every day | RECTAL | 0 refills | Status: AC | PRN
  Filled 2024-05-11: qty 5, 5d supply, fill #0

## 2024-05-15 ENCOUNTER — Other Ambulatory Visit (HOSPITAL_BASED_OUTPATIENT_CLINIC_OR_DEPARTMENT_OTHER): Payer: Self-pay

## 2024-05-15 ENCOUNTER — Other Ambulatory Visit (HOSPITAL_COMMUNITY): Payer: Self-pay

## 2024-05-15 ENCOUNTER — Other Ambulatory Visit: Payer: Self-pay

## 2024-05-15 MED ORDER — HYDROCORTISONE (PERIANAL) 2.5 % EX CREA
1.0000 | TOPICAL_CREAM | Freq: Two times a day (BID) | CUTANEOUS | 0 refills | Status: AC
  Filled 2024-05-15 – 2024-05-18 (×2): qty 30, 10d supply, fill #0

## 2024-05-18 ENCOUNTER — Other Ambulatory Visit (HOSPITAL_BASED_OUTPATIENT_CLINIC_OR_DEPARTMENT_OTHER): Payer: Self-pay

## 2024-07-09 ENCOUNTER — Ambulatory Visit: Admitting: Neurology
